# Patient Record
Sex: Female | Born: 1955 | Race: Black or African American | Hispanic: No | Smoking: Never smoker
Health system: Southern US, Community
[De-identification: ages and names within clinical notes are randomized; demographics above are authoritative.]

## PROBLEM LIST (undated history)

## (undated) ENCOUNTER — Emergency Department (HOSPITAL_COMMUNITY): Payer: Medicare Other

## (undated) DIAGNOSIS — K852 Alcohol induced acute pancreatitis without necrosis or infection: Secondary | ICD-10-CM

## (undated) DIAGNOSIS — Z992 Dependence on renal dialysis: Secondary | ICD-10-CM

## (undated) DIAGNOSIS — E1110 Type 2 diabetes mellitus with ketoacidosis without coma: Secondary | ICD-10-CM

## (undated) DIAGNOSIS — Z87442 Personal history of urinary calculi: Secondary | ICD-10-CM

## (undated) DIAGNOSIS — G629 Polyneuropathy, unspecified: Secondary | ICD-10-CM

## (undated) DIAGNOSIS — F32A Depression, unspecified: Secondary | ICD-10-CM

## (undated) DIAGNOSIS — E559 Vitamin D deficiency, unspecified: Secondary | ICD-10-CM

## (undated) DIAGNOSIS — K859 Acute pancreatitis without necrosis or infection, unspecified: Secondary | ICD-10-CM

## (undated) DIAGNOSIS — E785 Hyperlipidemia, unspecified: Secondary | ICD-10-CM

## (undated) DIAGNOSIS — I5030 Unspecified diastolic (congestive) heart failure: Secondary | ICD-10-CM

## (undated) DIAGNOSIS — R011 Cardiac murmur, unspecified: Secondary | ICD-10-CM

## (undated) DIAGNOSIS — N189 Chronic kidney disease, unspecified: Secondary | ICD-10-CM

## (undated) DIAGNOSIS — E538 Deficiency of other specified B group vitamins: Secondary | ICD-10-CM

## (undated) DIAGNOSIS — N12 Tubulo-interstitial nephritis, not specified as acute or chronic: Secondary | ICD-10-CM

## (undated) DIAGNOSIS — E876 Hypokalemia: Secondary | ICD-10-CM

## (undated) DIAGNOSIS — K529 Noninfective gastroenteritis and colitis, unspecified: Secondary | ICD-10-CM

## (undated) DIAGNOSIS — K3184 Gastroparesis: Secondary | ICD-10-CM

## (undated) DIAGNOSIS — M792 Neuralgia and neuritis, unspecified: Secondary | ICD-10-CM

## (undated) DIAGNOSIS — M62838 Other muscle spasm: Secondary | ICD-10-CM

## (undated) DIAGNOSIS — I10 Essential (primary) hypertension: Secondary | ICD-10-CM

## (undated) DIAGNOSIS — K219 Gastro-esophageal reflux disease without esophagitis: Secondary | ICD-10-CM

## (undated) DIAGNOSIS — F329 Major depressive disorder, single episode, unspecified: Secondary | ICD-10-CM

## (undated) DIAGNOSIS — R569 Unspecified convulsions: Secondary | ICD-10-CM

## (undated) HISTORY — DX: Noninfective gastroenteritis and colitis, unspecified: K52.9

## (undated) HISTORY — PX: TUBAL LIGATION: SHX77

## (undated) HISTORY — DX: Personal history of urinary calculi: Z87.442

---

## 2008-02-29 ENCOUNTER — Ambulatory Visit: Payer: Self-pay | Admitting: Cardiology

## 2008-03-01 ENCOUNTER — Encounter (INDEPENDENT_AMBULATORY_CARE_PROVIDER_SITE_OTHER): Payer: Self-pay | Admitting: Internal Medicine

## 2008-03-01 ENCOUNTER — Inpatient Hospital Stay (HOSPITAL_COMMUNITY): Admission: EM | Admit: 2008-03-01 | Discharge: 2008-03-02 | Payer: Self-pay | Admitting: Emergency Medicine

## 2008-06-18 ENCOUNTER — Inpatient Hospital Stay (HOSPITAL_COMMUNITY): Admission: EM | Admit: 2008-06-18 | Discharge: 2008-06-19 | Payer: Self-pay | Admitting: Emergency Medicine

## 2009-04-06 ENCOUNTER — Observation Stay (HOSPITAL_COMMUNITY)
Admission: EM | Admit: 2009-04-06 | Discharge: 2009-04-07 | Payer: Self-pay | Source: Home / Self Care | Admitting: Emergency Medicine

## 2010-02-04 ENCOUNTER — Ambulatory Visit: Payer: Self-pay | Admitting: Internal Medicine

## 2010-02-24 ENCOUNTER — Ambulatory Visit (HOSPITAL_COMMUNITY)
Admission: RE | Admit: 2010-02-24 | Discharge: 2010-02-24 | Payer: Self-pay | Source: Home / Self Care | Attending: Internal Medicine | Admitting: Internal Medicine

## 2010-02-24 ENCOUNTER — Ambulatory Visit: Admit: 2010-02-24 | Payer: Self-pay | Admitting: Internal Medicine

## 2010-02-24 HISTORY — PX: COLONOSCOPY: SHX174

## 2010-03-03 LAB — GLUCOSE, CAPILLARY: Glucose-Capillary: 290 mg/dL — ABNORMAL HIGH (ref 70–99)

## 2010-03-10 NOTE — Op Note (Signed)
  Erica [name], Erica [name]            ACCOUNT NO.:  [id number]  MEDICAL RECORD NO.:  [id number]          PATIENT TYPE:  AMB  LOCATION:  DAY                           FACILITY:  [facility]  PHYSICIAN:  Hildred Laser, M.D.    DATE OF BIRTH:  1955-10-28  DATE OF PROCEDURE:  02/24/2010 DATE OF DISCHARGE:                              OPERATIVE REPORT   PROCEDURE:  Colonoscopy.  INDICATIONS:  Erica [name] is 55 year old [place] female who has had diarrhea for more than 2 months.  Stool study has been negative. She also reports that she has lost several pounds.  She believes her appetite is normal.  She has also had some nocturnal bowel movements. Procedures were reviewed with the patient.  Informed consent was obtained.  MEDS FOR CONSCIOUS SEDATION: 1. Demerol 50 mg IV. 2. Versed 6 mg IV.  FINDINGS:  Procedure performed in endoscopy suite.  The patient's vital signs and O2 sat were monitored during the procedure and remained stable.  The patient was placed in left lateral recumbent position and rectal examination performed.  No abnormality noted on external or digital exam.  Pentax videoscope was placed through rectum and advanced under vision into sigmoid colon beyond.  Preparation was fair.  She had pooling of stool in various segments.  Scope was passed into cecum which was identified by appendiceal orifice and ileocecal valve.  There was a 2-mm polyp at cecum which was ablated via cold biopsy.  There is another 4-mm polyp at hepatic flexure ablated in similar fashion.  Mucosa.  Rest of the colon was normal.  Random biopsies were taken from mucosa of sigmoid colon looking for microscopic and/or collagenous colitis.  Rest of the mucosa was normal.  Rectal mucosa similarly was normal.  Scope was retroflexed to examine anorectal junction which was unremarkable.  Endoscope was then withdrawn.  Withdrawal time was 9 minutes.  The patient tolerated the procedure well.  FINAL  DIAGNOSES: 1. Examination performed to cecum. 2. Fair prep limiting quality of exam. 3. Two small polyps ablated via cold biopsy, one from cecum, second     one from hepatic flexure. 4. Biopsies taken from normal-appearing mucosa of sigmoid colon     looking for microscopic/collagenous colitis.  RECOMMENDATIONS: 1. Standard instructions given. 2. The patient advised to take loperamide OTC 2 mg b.i.d.  We will     contact patient with results of biopsy and further recommendations.     Hildred Laser, M.D.     NR/MEDQ  D:  02/24/2010  T:  02/24/2010  Job:  [id number]  cc:   Octavio Graves Fax: [phone]  Electronically Signed by Hildred Laser M.D. on 03/10/2010 12:14:55 PM

## 2010-03-31 ENCOUNTER — Ambulatory Visit (INDEPENDENT_AMBULATORY_CARE_PROVIDER_SITE_OTHER): Payer: Self-pay | Admitting: Internal Medicine

## 2010-05-07 LAB — GLUCOSE, CAPILLARY
Glucose-Capillary: 123 mg/dL — ABNORMAL HIGH (ref 70–99)
Glucose-Capillary: 154 mg/dL — ABNORMAL HIGH (ref 70–99)
Glucose-Capillary: 168 mg/dL — ABNORMAL HIGH (ref 70–99)
Glucose-Capillary: 32 mg/dL — CL (ref 70–99)
Glucose-Capillary: 32 mg/dL — CL (ref 70–99)
Glucose-Capillary: 34 mg/dL — CL (ref 70–99)
Glucose-Capillary: 43 mg/dL — CL (ref 70–99)
Glucose-Capillary: 48 mg/dL — ABNORMAL LOW (ref 70–99)
Glucose-Capillary: 62 mg/dL — ABNORMAL LOW (ref 70–99)
Glucose-Capillary: 65 mg/dL — ABNORMAL LOW (ref 70–99)
Glucose-Capillary: 74 mg/dL (ref 70–99)
Glucose-Capillary: 84 mg/dL (ref 70–99)
Glucose-Capillary: 87 mg/dL (ref 70–99)
Glucose-Capillary: 91 mg/dL (ref 70–99)
Glucose-Capillary: 97 mg/dL (ref 70–99)

## 2010-05-07 LAB — DIFFERENTIAL
Basophils Absolute: 0 10*3/uL (ref 0.0–0.1)
Basophils Relative: 1 % (ref 0–1)
Eosinophils Absolute: 0 10*3/uL (ref 0.0–0.7)
Eosinophils Relative: 0 % (ref 0–5)
Lymphocytes Relative: 24 % (ref 12–46)
Lymphs Abs: 1.5 10*3/uL (ref 0.7–4.0)
Monocytes Absolute: 0.4 10*3/uL (ref 0.1–1.0)
Monocytes Relative: 6 % (ref 3–12)
Neutro Abs: 4.3 10*3/uL (ref 1.7–7.7)
Neutrophils Relative %: 70 % (ref 43–77)

## 2010-05-07 LAB — COMPREHENSIVE METABOLIC PANEL
ALT: 54 U/L — ABNORMAL HIGH (ref 0–35)
AST: 49 U/L — ABNORMAL HIGH (ref 0–37)
Albumin: 3.6 g/dL (ref 3.5–5.2)
Alkaline Phosphatase: 141 U/L — ABNORMAL HIGH (ref 39–117)
BUN: 9 mg/dL (ref 6–23)
CO2: 30 mEq/L (ref 19–32)
Calcium: 8.4 mg/dL (ref 8.4–10.5)
Chloride: 105 mEq/L (ref 96–112)
Creatinine, Ser: 0.67 mg/dL (ref 0.4–1.2)
GFR calc Af Amer: >60 mL/min (ref >60)
GFR calc non Af Amer: >60 mL/min (ref >60)
Glucose, Bld: 94 mg/dL (ref 70–99)
Potassium: 4.5 mEq/L (ref 3.5–5.1)
Sodium: 142 mEq/L (ref 135–145)
Total Bilirubin: 0.5 mg/dL (ref 0.3–1.2)
Total Protein: 6.9 g/dL (ref 6.0–8.3)

## 2010-05-07 LAB — CBC
HCT: 39.3 % (ref 36.0–46.0)
Hemoglobin: 13.6 g/dL (ref 12.0–15.0)
MCHC: 34.6 g/dL (ref 30.0–36.0)
MCV: 93.1 fL (ref 78.0–100.0)
Platelets: 196 10*3/uL (ref 150–400)
RBC: 4.22 MIL/uL (ref 3.87–5.11)
RDW: 13 % (ref 11.5–15.5)
WBC: 6.1 10*3/uL (ref 4.0–10.5)

## 2010-05-07 LAB — HEMOGLOBIN A1C
Hgb A1c MFr Bld: 9.8 % — ABNORMAL HIGH (ref 4.6–6.1)
Mean Plasma Glucose: 235 mg/dL

## 2010-05-07 LAB — LIPASE, BLOOD: Lipase: 13 U/L (ref 11–59)

## 2010-05-07 LAB — ETHANOL: Alcohol, Ethyl (B): 234 mg/dL — ABNORMAL HIGH (ref 0–10)

## 2010-05-27 LAB — GLUCOSE, CAPILLARY
Glucose-Capillary: 232 mg/dL — ABNORMAL HIGH (ref 70–99)
Glucose-Capillary: 238 mg/dL — ABNORMAL HIGH (ref 70–99)
Glucose-Capillary: 249 mg/dL — ABNORMAL HIGH (ref 70–99)
Glucose-Capillary: 251 mg/dL — ABNORMAL HIGH (ref 70–99)
Glucose-Capillary: 261 mg/dL — ABNORMAL HIGH (ref 70–99)
Glucose-Capillary: 312 mg/dL — ABNORMAL HIGH (ref 70–99)
Glucose-Capillary: 315 mg/dL — ABNORMAL HIGH (ref 70–99)
Glucose-Capillary: 349 mg/dL — ABNORMAL HIGH (ref 70–99)
Glucose-Capillary: 380 mg/dL — ABNORMAL HIGH (ref 70–99)
Glucose-Capillary: 87 mg/dL (ref 70–99)

## 2010-05-27 LAB — CBC
HCT: 33.4 % — ABNORMAL LOW (ref 36.0–46.0)
HCT: 36.9 % (ref 36.0–46.0)
Hemoglobin: 11.5 g/dL — ABNORMAL LOW (ref 12.0–15.0)
Hemoglobin: 12.7 g/dL (ref 12.0–15.0)
MCHC: 34.4 g/dL (ref 30.0–36.0)
MCHC: 35 g/dL (ref 30.0–36.0)
MCV: 91.8 fL (ref 78.0–100.0)
Platelets: 190 10*3/uL (ref 150–400)
Platelets: 192 10*3/uL (ref 150–400)
RBC: 3.64 MIL/uL — ABNORMAL LOW (ref 3.87–5.11)
RDW: 13.5 % (ref 11.5–15.5)
RDW: 13.6 % (ref 11.5–15.5)
WBC: 8.4 10*3/uL (ref 4.0–10.5)

## 2010-05-27 LAB — URINALYSIS, ROUTINE W REFLEX MICROSCOPIC
Bilirubin Urine: NEGATIVE
Ketones, ur: NEGATIVE mg/dL
Protein, ur: NEGATIVE mg/dL
Urobilinogen, UA: 0.2 mg/dL (ref 0.0–1.0)

## 2010-05-27 LAB — BASIC METABOLIC PANEL
BUN: 11 mg/dL (ref 6–23)
BUN: 5 mg/dL — ABNORMAL LOW (ref 6–23)
BUN: 7 mg/dL (ref 6–23)
BUN: 8 mg/dL (ref 6–23)
CO2: 23 mEq/L (ref 19–32)
CO2: 23 mEq/L (ref 19–32)
CO2: 26 mEq/L (ref 19–32)
Calcium: 8.3 mg/dL — ABNORMAL LOW (ref 8.4–10.5)
Calcium: 8.9 mg/dL (ref 8.4–10.5)
Chloride: 103 mEq/L (ref 96–112)
Chloride: 113 mEq/L — ABNORMAL HIGH (ref 96–112)
Creatinine, Ser: 0.63 mg/dL (ref 0.4–1.2)
Creatinine, Ser: 0.68 mg/dL (ref 0.4–1.2)
GFR calc Af Amer: >60 mL/min (ref >60)
GFR calc non Af Amer: >60 mL/min (ref >60)
Glucose, Bld: 253 mg/dL — ABNORMAL HIGH (ref 70–99)
Glucose, Bld: 326 mg/dL — ABNORMAL HIGH (ref 70–99)
Glucose, Bld: 352 mg/dL — ABNORMAL HIGH (ref 70–99)
Potassium: 3 mEq/L — ABNORMAL LOW (ref 3.5–5.1)
Potassium: 4.5 mEq/L (ref 3.5–5.1)
Potassium: 4.7 mEq/L (ref 3.5–5.1)
Sodium: 133 mEq/L — ABNORMAL LOW (ref 135–145)
Sodium: 143 mEq/L (ref 135–145)

## 2010-05-27 LAB — DIFFERENTIAL
Basophils Absolute: 0 10*3/uL (ref 0.0–0.1)
Basophils Absolute: 0 10*3/uL (ref 0.0–0.1)
Basophils Relative: 0 % (ref 0–1)
Basophils Relative: 1 % (ref 0–1)
Eosinophils Absolute: 0.2 10*3/uL (ref 0.0–0.7)
Eosinophils Relative: 2 % (ref 0–5)
Eosinophils Relative: 2 % (ref 0–5)
Lymphocytes Relative: 28 % (ref 12–46)
Lymphs Abs: 2.3 10*3/uL (ref 0.7–4.0)
Monocytes Absolute: 0.4 10*3/uL (ref 0.1–1.0)
Monocytes Absolute: 0.6 10*3/uL (ref 0.1–1.0)
Monocytes Relative: 6 % (ref 3–12)
Neutro Abs: 3.7 10*3/uL (ref 1.7–7.7)
Neutrophils Relative %: 56 % (ref 43–77)

## 2010-05-27 LAB — HEPATIC FUNCTION PANEL
ALT: 20 U/L (ref 0–35)
Albumin: 3 g/dL — ABNORMAL LOW (ref 3.5–5.2)
Albumin: 3 g/dL — ABNORMAL LOW (ref 3.5–5.2)
Alkaline Phosphatase: 84 U/L (ref 39–117)
Bilirubin, Direct: 0.1 mg/dL (ref 0.0–0.3)
Indirect Bilirubin: 0.3 mg/dL (ref 0.3–0.9)
Total Bilirubin: 0.6 mg/dL (ref 0.3–1.2)
Total Protein: 5.7 g/dL — ABNORMAL LOW (ref 6.0–8.3)

## 2010-05-27 LAB — RETICULOCYTES
RBC.: 3.64 MIL/uL — ABNORMAL LOW (ref 3.87–5.11)
Retic Count, Absolute: 123.8 10*3/uL (ref 19.0–186.0)
Retic Ct Pct: 3.4 % — ABNORMAL HIGH (ref 0.4–3.1)

## 2010-05-27 LAB — IRON AND TIBC
Iron: 54 ug/dL (ref 42–135)
UIBC: 228 ug/dL

## 2010-05-27 LAB — FOLATE: Folate: 6.8 ng/mL

## 2010-05-27 LAB — FERRITIN: Ferritin: 155 ng/mL (ref 10–291)

## 2010-05-27 LAB — RAPID URINE DRUG SCREEN, HOSP PERFORMED
Amphetamines: NOT DETECTED
Benzodiazepines: POSITIVE — AB
Cocaine: NOT DETECTED
Opiates: NOT DETECTED
Tetrahydrocannabinol: NOT DETECTED

## 2010-05-27 LAB — POCT I-STAT, CHEM 8
BUN: 12 mg/dL (ref 6–23)
BUN: 12 mg/dL (ref 6–23)
Chloride: 114 mEq/L — ABNORMAL HIGH (ref 96–112)
Creatinine, Ser: 0.5 mg/dL (ref 0.4–1.2)
Creatinine, Ser: 0.7 mg/dL (ref 0.4–1.2)
Potassium: 3.4 mEq/L — ABNORMAL LOW (ref 3.5–5.1)
Sodium: 147 mEq/L — ABNORMAL HIGH (ref 135–145)
Sodium: 149 mEq/L — ABNORMAL HIGH (ref 135–145)
TCO2: 20 mmol/L (ref 0–100)

## 2010-05-27 LAB — VITAMIN B12: Vitamin B-12: 356 pg/mL (ref 211–911)

## 2010-06-02 LAB — URINALYSIS, ROUTINE W REFLEX MICROSCOPIC
Ketones, ur: 15 mg/dL — AB
Leukocytes, UA: NEGATIVE
Nitrite: NEGATIVE
Protein, ur: NEGATIVE mg/dL

## 2010-06-02 LAB — GLUCOSE, CAPILLARY
Glucose-Capillary: 278 mg/dL — ABNORMAL HIGH (ref 70–99)
Glucose-Capillary: 291 mg/dL — ABNORMAL HIGH (ref 70–99)

## 2010-06-02 LAB — COMPREHENSIVE METABOLIC PANEL
AST: 29 U/L (ref 0–37)
Albumin: 3.8 g/dL (ref 3.5–5.2)
Chloride: 96 mEq/L (ref 96–112)
Creatinine, Ser: 0.65 mg/dL (ref 0.4–1.2)
GFR calc Af Amer: >60 mL/min (ref >60)
Total Bilirubin: 0.5 mg/dL (ref 0.3–1.2)

## 2010-06-02 LAB — CBC
MCV: 90.7 fL (ref 78.0–100.0)
Platelets: 135 10*3/uL — ABNORMAL LOW (ref 150–400)
WBC: 5.3 10*3/uL (ref 4.0–10.5)

## 2010-06-02 LAB — CARDIAC PANEL(CRET KIN+CKTOT+MB+TROPI)
CK, MB: 0.8 ng/mL (ref 0.3–4.0)
CK, MB: 0.9 ng/mL (ref 0.3–4.0)
CK, MB: 1 ng/mL (ref 0.3–4.0)
Relative Index: INVALID (ref 0.0–2.5)
Relative Index: INVALID (ref 0.0–2.5)
Total CK: 28 U/L (ref 7–177)
Troponin I: 0.03 ng/mL (ref 0.00–0.06)
Troponin I: <0.01 ng/mL (ref 0.00–0.06)

## 2010-06-02 LAB — DIFFERENTIAL
Basophils Relative: 1 % (ref 0–1)
Eosinophils Absolute: 0.1 10*3/uL (ref 0.0–0.7)
Lymphs Abs: 1.9 10*3/uL (ref 0.7–4.0)
Neutro Abs: 3 10*3/uL (ref 1.7–7.7)
Neutrophils Relative %: 57 % (ref 43–77)

## 2010-06-02 LAB — LIPASE, BLOOD: Lipase: 11 U/L (ref 11–59)

## 2010-06-02 LAB — PROTIME-INR: Prothrombin Time: 14.1 seconds (ref 11.6–15.2)

## 2010-06-02 LAB — HEMOGLOBIN A1C

## 2010-07-01 NOTE — H&P (Signed)
Erica [name], Erica [name]            ACCOUNT NO.:  [id number]   MEDICAL RECORD NO.:  [id number]          PATIENT TYPE:  OBV   LOCATION:  [id number]                          FACILITY:  [facility]   PHYSICIAN:  Bonnielee Haff, MD     DATE OF BIRTH:  1955/10/22   DATE OF ADMISSION:  06/16/2008  DATE OF DISCHARGE:  LH                              HISTORY & PHYSICAL   PRIVATE MEDICAL DOCTOR:  Octavio Graves, in [place].   ADMITTING DIAGNOSES:  1. Persistent hypoglycemia.  2. Acute alcohol intoxication.  3. Hypotension, likely because of hypovolemia.  4. Mild hypokalemia.  5. History of insulin dependent diabetes mellitus.  6. History of dyslipidemia.  7. History of hypertension.   CHIEF COMPLAINT:  Nausea and vomiting.   HISTORY OF PRESENT ILLNESS:  The patient is a 55 year old African  American female old African  American female who was brought in to the hospital via EMS for altered  mental status.  According to the reports available from the ED, EMS was  called by one of her friends.  She apparently was confused and not  acting correct and they were concerned.  The patient appears to be a  little bit more lucid now.  She tells me that she has been having nausea  and vomiting for the past 3 days; however, denies any diarrhea or  abdominal pain.  She has been drinking vodka for the past many days.  She has 1-1/2 pints in the last 2-3 days.  She is also complaining of  numb feet and pain in her legs for the past many months.  She also  complains of difficulty with swallowing pills and food for the last year  or so.  She has never seen a gastroenterologist.  She denies any blood  in the stools or black colored stools.  She still appears to be slightly  confused so that is the extent of the history I have.  She keeps telling  me that she wants to go home.   MEDICATIONS AT HOME:  Unfortunately, she brought in only a few of her  pills.  The pills that we have include:  1. Fluconazole 100 mg daily.  2. Arthrotec b.i.d.  3.  Oxybutynin 5 mg b.i.d.  4. Metformin 500 mg b.i.d.  5. Gemfibrozil 600 mg b.i.d.  6. She has an empty bottle of ciprofloxacin.  7. She tells me that she is supposed to take Lantus 45 units every      night.  8. Apidra 10 units t.i.d. with meals.   She is on a lot of other medications which she cannot name at this time.   ALLERGIES:  ASPIRIN which apparently causes palpitations.   PAST MEDICAL HISTORY:  1. History of pancreatitis.  2. History of nephrolithiasis.  3. Diabetes mellitus.  4. Hypertension.  5. Dyslipidemia.  6. Acid reflux disease.  7. Depression.   SOCIAL HISTORY:  Lives in [place] with her boyfriend.  Currently  unemployed. No smoking; however drinks quite heavily.  Denies any  illicit drug use.  Independent with daily activities.   FAMILY HISTORY:  Positive for alcoholism and diabetes.   REVIEW  OF SYSTEMS:  GENERAL:  Positive for weakness.  HEENT:  Unremarkable.  CARDIOVASCULAR:  Unremarkable.  RESPIRATORY:  Unremarkable.  GASTROINTESTINAL:  As in HPI.  GU:  Unremarkable.  NEUROLOGIC: Unremarkable.  PSYCHIATRIC:  Unremarkable.  MUSCULOSKELETAL:  As in HPI.  DERMATOLOGIC:  Unremarkable.  Other systems unremarkable.   PHYSICAL EXAMINATION:  VITAL SIGNS:  Temperature 99.5 rectally, blood  pressure initially was 113/52, dropping into the 80s/40s, improved with  IV bolus.  The last reading was in the 110s/60s.  Heart rate in the 90s,  respirations 18, saturation 97% in room air.  GENERAL:  This is a middle aged [place] American female confused, in no  distress.  HEENT:  There is no pallor or icterus.  Oral mucosal membranes slightly  dry, no oral lesions are noted.  NECK: Soft and supple, no thyromegaly is appreciated.  LUNGS:  Clear to auscultation anteriorly bilaterally.  CARDIOVASCULAR:  S1 and S2 normal and regular, no murmurs appreciated.  No S3 or S4.  No rubs, no bruits.  ABDOMEN:  Soft, nontender, nondistended.  Bowel sounds present.  No mass  or  organomegaly is appreciated.  GU:  Deferred.  MUSCULOSKELETAL:  Unremarkable.  NEUROLOGIC:  She is somnolent, easily arousable.  No focal deficits are  present. Peripheral pulses are palpable.  SKIN:  No rash.   LABORATORY DATA:  Urinalysis shows glucosuria, trace blood.  Urine drug  screen positive for benzodiazepines.  Alcohol  level 359.  Hemoglobin  12.7, MCV 92, platelet count 319,000.  Initial sodium 3.0, glucose 326;  however subsequent readings, the glucose has been low in the 50s and 60s  and occasional in the 40s.  HbA1c could not be checked because of  abnormal hemoglobin.   No imaging studies have been done.   ASSESSMENT:  This is a 55 year old African American female who has  diabetes, hypertension, dyslipidemia, who unfortunately consumes a lot  of alcohol, who was brought in for altered mental status and found to  have refractory hypoglycemia.  She is also acutely intoxicated with  alcohol.  She has mild hypokalemia and hyponatremia.  She is also  transiently hypotensive which has responded to IV fluids. I think she is  a little bit hypovolemic because of the nausea and vomiting that she has  been mentioning.  Hypoglycemia probably is probably as a result of  alcohol intoxication.   PLAN:  1. Hypoglycemia.  She is on D5 drip.  Will check CBGs q.1 hour until      levels are greater than 150 x3 and then will change CBGs to q.4      hours.  Once the CBGs remain consistently high, D5 can be changed      over to normal saline or be discontinued depending on her blood      pressure.  Once her blood sugars stay stable off the dextrose once      she is eating, she should be able to go home.  2. Acute alcohol intoxication.  Will give her thiamine, check      magnesium levels.  PPI will be given.  Counseling will be provided.  3. History of dysphagia.  She does have a history of gastroesophageal      reflux disease and it is likely that she could have some strictures       which are causing dysphagia.  However, this is a chronic issue and      she requires outpatient GI evaluation.  PPIs will be continued.  4.  Hypotension, likely secondary to hypovolemia.  This has responded      to IV fluids.  Will monitor her closely overnight and give bolus as      needed.  5. Hypokalemia.  This will be repeated via IV fluids.  6. History of dyslipidemia.  Continue with gemfibrozil.  I do not know      if she is on any other agents for her cholesterol.  We will have to      obtain a medication list from home.  7. Deep vein thrombosis prophylaxis will be given.   Further management decisions will depend on the results of further  testing and patient's response to treatment.      Bonnielee Haff, MD  Electronically Signed     GK/MEDQ  D:  06/17/2008  T:  06/17/2008  Job:  [id number]   cc:   Octavio Graves  Fax: [phone]

## 2010-07-01 NOTE — H&P (Signed)
NAMEJAYDEEN, TOCCO            ACCOUNT NO.:  [id number]   MEDICAL RECORD NO.:  [id number]          PATIENT TYPE:  EMS   LOCATION:  ED                            FACILITY:  [facility]   PHYSICIAN:  Bonnielee Haff, MD     DATE OF BIRTH:  March 03, 1955   DATE OF ADMISSION:  02/29/2008  DATE OF DISCHARGE:  LH                              HISTORY & PHYSICAL   The patient's PMD is Dr. Octavio Graves who is [place].   ADMISSION DIAGNOSES:  1. Syncope, unclear etiology.  2. History of diabetes type 2.  3. Dyslipidemia.  4. Hypertension,  5. Acid reflux disease.  6. Depression.   CHIEF COMPLAINT:  Passed out.   HISTORY OF PRESENT ILLNESS:  The patient is a 55 year old African-  American female who was brought in to the hospital by EMS after she  apparently lost consciousness at home.  Unfortunately no family member  is available.  According to the ED reports the EMS was called by her  boyfriend who unfortunately did not come to the hospital.  Blood sugar  was checked at home and was greater than 200.  The patient does not  recall the event.  She is complaining of a right-sided headache, and she  thinks that she may have knocked her head on a door.  Otherwise, she  denies any chest pain, shortness of breath, nausea, vomiting.  Denies  any history of seizure episodes.  Denies any previous episodes of loss  of consciousness.  So history is not very forthcoming from this  individual.  She also denies any focal weakness on any one side.   MEDICATIONS AT HOME:  She is on Lovaza 4 grams once a day, Phenergan 25  mg as needed.  Chlordiazepoxide 25 mg 4 times a day, paroxetine 20 mg  once a day, fluconazole 100 mg once a day, Trilipix 135 mg once,  Bupropion XL 300 mg once a day, Arthrotec 50 twice a day, oxybutynin 5  mg twice a day, metformin 1000 mg twice a day.  Gemfibrozil 600 mg twice  a day, Klor-Con 20 mEq daily, Nexium 40 mg daily, lisinopril 20 mg  daily, Lantus insulin 45 units q.h.s.  sliding scale insulin.  Simvastatin 40 mg q.p.m. Prempro once a day, vitamin D twice a week.   ALLERGIES:  Aspirin, though she does have an aspirin bottle in her  medication box.   SURGICAL HISTORY:  She mentioned only surgeries for kidney stone.  Otherwise denies any other procedures.   SOCIAL HISTORY:  Lives in [place] alone.  No smoking.  Occasional  alcohol.  Independent with daily activities.   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   FAMILY HISTORY:  Positive for diabetes, alcoholism.   REVIEW OF SYSTEMS:  GENERAL:  Positive for weakness, otherwise  unremarkable.  CARDIOVASCULAR: Unremarkable.  RESPIRATORY:  Unremarkable.  HEENT: Positive for headache. GI: Unremarkable.  GU:  Unremarkable.  NEUROLOGICAL:  Unremarkable.  PSYCHIATRIC:  Unremarkable.  Other systems unremarkable.   PHYSICAL EXAMINATION:  VITAL SIGNS: Temperature 98.7, blood pressure  151/79, heart rate 96, respiratory rate 18, saturation 100% on room air.  GENERAL:  This is a middle-aged black female in no distress.  HEENT: There is no pallor, no icterus.  Oral mucous membranes are moist.  No oral lesions are noted.  NECK: Soft and supple.  No thyromegaly is appreciated.  LUNGS: Few crackles at the bases which decrease after her she takes deep  breaths.  No wheezing is present.  CARDIOVASCULAR:  S1, S2 normal,  regular. Possible systolic murmur in the aortic area.  No bruits  appreciated.  No S3-S4.  No rubs.  No edema is present.  ABDOMEN: Soft, nontender, nondistended.  Bowel sounds are present.  No  masses or organomegaly is appreciated.  NEUROLOGICAL:  She is alert, oriented to place, person and time.  According to the ED physician she was not oriented to time about an hour  ago.  No cranial nerve deficits are present.  Finger-to-nose is  deficient on the left side.  She seems to have subtle  left-sided  weakness, upper and lower extremities, though it is very difficult to  say.  Gait is not assessed.  Sensory exam is unremarkable.  Reflexes are  equal bilaterally.  Cerebellar signs slightly slower on the left side.  No pronator drift identified.   IMAGING STUDIES:  Chest x-ray was unremarkable.  CT of the head was done  which was unremarkable.   CBC showed platelet count of 135. Otherwise unremarkable.  CMET showed a  glucose of 309. Otherwise unremarkable. And UA did not show any  infection.  EKG showed sinus rhythm with a normal axis. Intervals appear  to be in the normal range.  No definite Q-waves are identified on EKG.  Poor R-wave progression is noted.  No acute ST or T-wave changes of  concern are appreciated.   ASSESSMENT:  This is a 55 year old African-American female who presents  with a syncopal episode, etiology of which is not very clear.  Differential diagnoses include seizure, transient ischemic attack,  cerebrovascular accident.   PLAN:  1. Syncope.  She merits observation in the hospital for further      treatment and evaluation.  She I think would benefit from an MRI      considering her possible weakness on the left side.  We will also      order an EEG, carotid Dopplers, echocardiogram.  Alcohol level will      be obtained.  Magnesium level will be checked.  PT/OT consultation      will also be obtained.  2. Diabetes. Continue with metformin. Hold off on the Lantus. She has      adequate p.o. intake.  Sliding scale will be provided.  3. Dyslipidemia.  She is on multiple agents for this, all of which      will be continued.  4. Deep vein thrombosis prophylaxis will be provided.   Further management decisions will depend on results of further testing  and patient's response to treatment.      Bonnielee Haff, MD  Electronically Signed     GK/MEDQ  D:  03/01/2008  T:  03/01/2008  Job:  [id number]   cc:   Octavio Graves  Fax: [phone]

## 2010-07-01 NOTE — Procedures (Signed)
NAMEJANI, Erica [name]            ACCOUNT NO.:  [id number]   MEDICAL RECORD NO.:  [id number]          PATIENT TYPE:  OBV   LOCATION:  A309                          FACILITY:  [facility]   PHYSICIAN:  Kofi A. Merlene Laughter, M.D. DATE OF BIRTH:  [birth year]   DATE OF PROCEDURE:  DATE OF DISCHARGE:                              EEG INTERPRETATION   HISTORY:  This is a 55 year old who presents with spells suspicious for  seizure.   MEDICATIONS:  Phenergan, hydrochlorothiazide, insulin, Paxil,  fluconazole, Wellbutrin, Arthrotec, potassium, Nexium, simvastatin,  Prempro, and vitamin D.   ANALYSIS:  A 16-channel recording is conducted for 26 minutes.  There is  a well-formed posterior rhythm of 9.5 Hz, which attenuates with eye  opening.  There is beta activity observed in the frontal areas.  Only  wake activity is observed, there is some drowsy activity.  Photic  stimulation and hyperventilation are carried out without significant  changes in the background activity.  There is no focal or lateralized  slowing.  There is no epileptiform activity observed.   IMPRESSION:  This is a normal recording of wake and drowsy states.  A  single seizure does not rule out epileptic seizure.  If clinically  indicated, a prolonged or sleep-deprived recording may be useful.      Kofi A. Merlene Laughter, M.D.  Electronically Signed     KAD/MEDQ  D:  03/02/2008  T:  03/02/2008  Job:  [id number]

## 2010-07-01 NOTE — Discharge Summary (Signed)
Erica [name], Erica [name]            ACCOUNT NO.:  [id number]   MEDICAL RECORD NO.:  [id number]          PATIENT TYPE:  INP   LOCATION:  [id number]                          FACILITY:  [facility]   PHYSICIAN:  Anselmo Pickler, DO    DATE OF BIRTH:  1955/07/10   DATE OF ADMISSION:  06/18/2008  DATE OF DISCHARGE:  05/04/2010LH                               DISCHARGE SUMMARY   PRIMARY CARE PHYSICIAN:  Dr. Octavio Graves.   There were no other consults besides diabetes management and there was  no testing/.   ADMISSION DIAGNOSES:  1. Persistent hypoglycemia.  2. Acute alcohol intoxication.  3. Hypotension, likely due to hypovolemia.  4. Mild hypokalemia.  5. History of insulin-dependent diabetes.  6. History of dyslipidemia.  7. History of hypertension.   HOSPITAL COURSE:  The patient is a 55 year old African American female  who was brought to the emergency room via EMS for altered mental status.  They stated that one of her friends called her, she called EMS.  She was  confused and she had nausea and vomiting for the last couple of days.  The patient has a history of alcoholism in which she drinks hard liquor  and goes on binges for many days.  She usually drinks vodka and did one  to two pints the last 2-3 days prior to her admission.  The patient  states that currently after discussion with her regarding whether or not  she wants to go to rehab or wants help with her alcoholism, she states  that she does not and states that she will continue to drink when she is  discharged.  The patient's blood sugars were followed.  She was put on a  D5 drip.  They were monitored q.4h.  Over time she was able to increase  her diet.  Her nausea or vomiting stopped.  Her hypovolemic state was  resolved.  She was also placed on the Ativan protocol due to her  pertinent positive alcohol level in the 300s.  When she was seen by  diabetes management they were cautious about putting her back on  metformin with  her drinking.  So we will take her off metformin and  leave her on her Lantus.  I have discussed with the patient the  necessity of her eating when she takes Lantus because if she does not  eat then her blood sugars will go down if she is on a binge drinking.  The patient has stated understanding of this.  However, I am concerned  about future medical noncompliance,   Her medications that she will go home on include:  1. Klor-Con 20 mEq daily.  2. Nexium 40 mg daily.  3. Lisinopril 20 mg daily.  4. Simvastatin 40 mg every evening.  5. Prempro 0.625/2.5 every day.  6. Ergocalciferol 500 units twice a day.  7. Lantus 45 units at bedtime and the sliding scale before meals 10      units before meals.  8. Lovaza 1 gram daily.  9. Phenergan 25 mg needed.  10.Chlordiazepoxide four times a day.  11.Paroxetine 20 mg daily.  12.Fluconazole  100 mg once a day.  13.Trilipix 135 mg every evening.  14.Bupropion 300 mg once a day.  15.Arthrotec 50/2 mg twice a day.  16.Oxybutynin 5 mg twice a day.  17.Gemfibrozil  600 mg twice a day.  18.Klor-Con 20 mEq once a day.   CONDITION:  Stable.   DISPOSITION:  To home.  There is some concern, however with the patient  unwilling to stop drinking that this situation may reoccur again.  So,  hopefully this will be able to be addressed by her primary care  physician.      Anselmo Pickler, DO  Electronically Signed     CB/MEDQ  D:  06/19/2008  T:  06/19/2008  Job:  [id number]

## 2010-07-04 NOTE — Discharge Summary (Signed)
Erica [name], Erica [name]            ACCOUNT NO.:  [id number]   MEDICAL RECORD NO.:  [id number]          PATIENT TYPE:  INP   LOCATION:  A309                          [facility]ACILITY:  [facility]   PHYSICIAN:  Audria Nine, M.D.DATE OF BIRTH:  01/08/1956   DATE OF ADMISSION:  02/29/2008  DATE OF DISCHARGE:  01/15/2010LH                               DISCHARGE SUMMARY   DISCHARGE DIAGNOSES:  1. Syncope.  2. Probable dehydration.  3. Diabetes mellitus, type 2.  4. Dyslipidemia.  5. Hypertension.  6. Gastroesophageal reflux disease.  7. Depression.   DISCHARGE MEDICATIONS:  1. Lovaza 4 g p.o. once a day.  2. Phenergan 25 mg as needed for nausea or vomiting.  3. Chlordiazepoxide 25 mg p.o. t.i.d.  4. Paroxetine 20 mg p.o. once a day.  5. Fluconazole 100 mg p.o. once a day.  6. Trilipix 135 mg p.o. once a day.  7. Bupropion XL 300 mg p.o. once a day.  8. Arthrotec 50 mg p.o. b.i.d.  9. Oxybutynin 5 mg once a day.  10.Potassium 20 mEq once a day.  11.Nexium 40 mg p.o. daily.  12.Lisinopril 20 mg once a day.  13.Lantus insulin 45 units subcu q.h.s.  14.Simvastatin 40 mg p.o. q.p.m.  15.Prempro once a day.  16.Vitamin D twice a week.   DIET:  Patient is going to be on a diabetic diet, heart-healthy diet.   ACTIVITIES:  As tolerated.   FOLLOWUP:  Patient to follow up with the primary care physician, Dr.  Octavio Graves, in [place] in about 3 to 4 weeks.   SPECIAL INSTRUCTIONS:  Patient advised to return to the emergency room  if she develops increasing shortness of breath or has any dizziness,  lightheadedness, or if she is notified that she had any syncopal episode  at home.   PERTINENT LABORATORY DATA DURING THE COURSE OF HOSPITALIZATION:  Chest x-  ray was negative.  A CT scan of the head was negative.  MRI of the brain  was also negative.  White blood cell count was normal.  Platelet count  was 135, blood glucose was 309.  Urinalysis was negative with no  evidence of  infection.  A 12-lead EKG shows normal sinus rhythm with a  normal axis.  No definite Q-waves were identified.   A carotid Doppler was negative for any evidence of hemodynamic  obstruction.   An EEG was normal.   CONSULTATIONS OBTAINED:  Dr. Merlene Laughter.   HOSPITAL COURSE:  Ms Giasson is a 55 year old female who was admitted  to the hospital on February 29, 2008.  She was brought in by EMS after  she apparently lost consciousness at home.  No family members were  available during the time of admission according to the ED reports.  EMS  was called by her boyfriend who did not follow her to the hospital at  the time of admission.  Patient's blood sugar was checked at that time  and was greater than 200.  Patient does not remember.  She was  complaining of a right-sided headache.  She thinks she might have  knocked her head on the floor.  She denied any chest pain, shortness of  breath, nausea, or vomiting, No impending doom feeling.  She had no  aura.  She has no history of seizure activity.  No history of loss of  consciousness.  She denied any focal weakness.   EXAMINATION AT THE TIME OF ADMISSION:  Patient was conscious, alert,  comfortable, not in acute distress, well oriented in time, place, and  person.  Her temperature was 98.7.  Blood pressure was 151/79 with a  heart rate of 96.  Respiratory rate is 18.  Oxygen saturation was 100%  on room air.  Rest of exam was unremarkable.  Patient was then admitted  to the medical floor and placed on telemetry.  She had unremarkable  evaluation including EEG and all as mentioned above.  Also, her  telemetry readings did not show any abnormalities.  Patient was  counseled in getting her blood sugar.  Her metformin was continued.  A  sliding scale was also provided in the course of hospitalization.  Since  patient's blood work was all negative, patient was advised to follow up  with her primary care physician.  Patient requested to be  discharged  home.  Patient was subsequently discharged home in satisfactory  condition.   DISPOSITION:  To home.      Audria Nine, M.D.  Electronically Signed     AM/MEDQ  D:  03/22/2008  T:  03/22/2008  Job:  [id number]

## 2010-10-29 ENCOUNTER — Encounter (INDEPENDENT_AMBULATORY_CARE_PROVIDER_SITE_OTHER): Payer: Self-pay

## 2011-06-11 DIAGNOSIS — R072 Precordial pain: Secondary | ICD-10-CM

## 2011-07-08 ENCOUNTER — Inpatient Hospital Stay (HOSPITAL_COMMUNITY)
Admission: EM | Admit: 2011-07-08 | Discharge: 2011-07-11 | DRG: 917 | Disposition: A | Discharge status: Home Health | Payer: Medicaid Other | Attending: Internal Medicine | Admitting: Internal Medicine

## 2011-07-08 ENCOUNTER — Emergency Department (HOSPITAL_COMMUNITY): Payer: Medicaid Other

## 2011-07-08 ENCOUNTER — Encounter (HOSPITAL_COMMUNITY): Payer: Self-pay | Admitting: Emergency Medicine

## 2011-07-08 DIAGNOSIS — R109 Unspecified abdominal pain: Secondary | ICD-10-CM | POA: Diagnosis present

## 2011-07-08 DIAGNOSIS — G929 Unspecified toxic encephalopathy: Secondary | ICD-10-CM | POA: Diagnosis present

## 2011-07-08 DIAGNOSIS — E876 Hypokalemia: Secondary | ICD-10-CM

## 2011-07-08 DIAGNOSIS — F10929 Alcohol use, unspecified with intoxication, unspecified: Secondary | ICD-10-CM | POA: Diagnosis present

## 2011-07-08 DIAGNOSIS — K861 Other chronic pancreatitis: Secondary | ICD-10-CM | POA: Diagnosis present

## 2011-07-08 DIAGNOSIS — Z794 Long term (current) use of insulin: Secondary | ICD-10-CM

## 2011-07-08 DIAGNOSIS — G9341 Metabolic encephalopathy: Secondary | ICD-10-CM

## 2011-07-08 DIAGNOSIS — R112 Nausea with vomiting, unspecified: Secondary | ICD-10-CM | POA: Diagnosis present

## 2011-07-08 DIAGNOSIS — L02519 Cutaneous abscess of unspecified hand: Secondary | ICD-10-CM | POA: Diagnosis present

## 2011-07-08 DIAGNOSIS — F1010 Alcohol abuse, uncomplicated: Secondary | ICD-10-CM

## 2011-07-08 DIAGNOSIS — G92 Toxic encephalopathy: Secondary | ICD-10-CM | POA: Diagnosis present

## 2011-07-08 DIAGNOSIS — L03119 Cellulitis of unspecified part of limb: Secondary | ICD-10-CM | POA: Diagnosis present

## 2011-07-08 DIAGNOSIS — E1169 Type 2 diabetes mellitus with other specified complication: Secondary | ICD-10-CM | POA: Diagnosis present

## 2011-07-08 DIAGNOSIS — E162 Hypoglycemia, unspecified: Secondary | ICD-10-CM

## 2011-07-08 DIAGNOSIS — E119 Type 2 diabetes mellitus without complications: Secondary | ICD-10-CM | POA: Diagnosis present

## 2011-07-08 DIAGNOSIS — T38801A Poisoning by unspecified hormones and synthetic substitutes, accidental (unintentional), initial encounter: Secondary | ICD-10-CM | POA: Diagnosis present

## 2011-07-08 DIAGNOSIS — Z8719 Personal history of other diseases of the digestive system: Secondary | ICD-10-CM

## 2011-07-08 DIAGNOSIS — T383X1A Poisoning by insulin and oral hypoglycemic [antidiabetic] drugs, accidental (unintentional), initial encounter: Principal | ICD-10-CM | POA: Diagnosis present

## 2011-07-08 DIAGNOSIS — R4182 Altered mental status, unspecified: Secondary | ICD-10-CM

## 2011-07-08 LAB — GLUCOSE, CAPILLARY
Glucose-Capillary: 178 mg/dL — ABNORMAL HIGH (ref 70–99)
Glucose-Capillary: 274 mg/dL — ABNORMAL HIGH (ref 70–99)
Glucose-Capillary: 92 mg/dL (ref 70–99)
Glucose-Capillary: 175 mg/dL — ABNORMAL HIGH (ref 70–99)
Glucose-Capillary: 181 mg/dL — ABNORMAL HIGH (ref 70–99)

## 2011-07-08 LAB — RAPID URINE DRUG SCREEN, HOSP PERFORMED
Amphetamines: NOT DETECTED
Cocaine: NOT DETECTED
Opiates: NOT DETECTED

## 2011-07-08 LAB — DIFFERENTIAL
Basophils Relative: 0 % (ref 0–1)
Lymphocytes Relative: 41 % (ref 12–46)
Lymphs Abs: 2.3 10*3/uL (ref 0.7–4.0)
Monocytes Absolute: 0.5 10*3/uL (ref 0.1–1.0)
Monocytes Relative: 8 % (ref 3–12)
Neutro Abs: 2.8 10*3/uL (ref 1.7–7.7)
Neutrophils Relative %: 50 % (ref 43–77)

## 2011-07-08 LAB — CBC
HCT: 32 % — ABNORMAL LOW (ref 36.0–46.0)
Hemoglobin: 11.4 g/dL — ABNORMAL LOW (ref 12.0–15.0)
MCHC: 35.6 g/dL (ref 30.0–36.0)
RBC: 3.67 MIL/uL — ABNORMAL LOW (ref 3.87–5.11)

## 2011-07-08 LAB — HEPATIC FUNCTION PANEL
Albumin: 3.2 g/dL — ABNORMAL LOW (ref 3.5–5.2)
Alkaline Phosphatase: 80 U/L (ref 39–117)
Total Bilirubin: 0.2 mg/dL — ABNORMAL LOW (ref 0.3–1.2)
Total Protein: 6.2 g/dL (ref 6.0–8.3)

## 2011-07-08 LAB — URINALYSIS, ROUTINE W REFLEX MICROSCOPIC
Bilirubin Urine: NEGATIVE
Leukocytes, UA: NEGATIVE
Nitrite: NEGATIVE
Specific Gravity, Urine: <1.005 — ABNORMAL LOW (ref 1.005–1.030)
Urobilinogen, UA: 0.2 mg/dL (ref 0.0–1.0)
pH: 5.5 (ref 5.0–8.0)

## 2011-07-08 LAB — BLOOD GAS, ARTERIAL
Bicarbonate: 27.2 mEq/L — ABNORMAL HIGH (ref 20.0–24.0)
Patient temperature: 37
TCO2: 25.1 mmol/L (ref 0–100)
pCO2 arterial: 44.6 mmHg (ref 35.0–45.0)
pH, Arterial: 7.403 — ABNORMAL HIGH (ref 7.350–7.400)
pO2, Arterial: 86.5 mmHg (ref 80.0–100.0)

## 2011-07-08 LAB — BASIC METABOLIC PANEL
BUN: 8 mg/dL (ref 6–23)
Chloride: 103 mEq/L (ref 96–112)
GFR calc Af Amer: >90 mL/min (ref >90)
GFR calc non Af Amer: >90 mL/min (ref >90)
Potassium: 2.7 mEq/L — CL (ref 3.5–5.1)
Sodium: 141 mEq/L (ref 135–145)

## 2011-07-08 LAB — POCT I-STAT, CHEM 8
Chloride: 103 mEq/L (ref 96–112)
HCT: 33 % — ABNORMAL LOW (ref 36.0–46.0)
Hemoglobin: 11.2 g/dL — ABNORMAL LOW (ref 12.0–15.0)
Potassium: 2.7 mEq/L — CL (ref 3.5–5.1)
Sodium: 144 mEq/L (ref 135–145)

## 2011-07-08 LAB — ACETAMINOPHEN LEVEL: Acetaminophen (Tylenol), Serum: <15 ug/mL (ref 10–30)

## 2011-07-08 LAB — CARDIAC PANEL(CRET KIN+CKTOT+MB+TROPI)
CK, MB: 1.3 ng/mL (ref 0.3–4.0)
Troponin I: <0.3 ng/mL (ref <0.30)

## 2011-07-08 LAB — SALICYLATE LEVEL: Salicylate Lvl: <2 mg/dL — ABNORMAL LOW (ref 2.8–20.0)

## 2011-07-08 MED ORDER — SODIUM CHLORIDE 0.9 % IV SOLN
1000.0000 mL | Freq: Once | INTRAVENOUS | Status: AC
  Administered 2011-07-08: 1000 mL via INTRAVENOUS

## 2011-07-08 MED ORDER — ADULT MULTIVITAMIN W/MINERALS CH
1.0000 | ORAL_TABLET | Freq: Once | ORAL | Status: AC
  Administered 2011-07-09: 1 via ORAL

## 2011-07-08 MED ORDER — DEXTROSE 50 % IV SOLN
INTRAVENOUS | Status: AC
  Administered 2011-07-08: 19:00:00
  Filled 2011-07-08: qty 50

## 2011-07-08 MED ORDER — THIAMINE HCL 100 MG/ML IJ SOLN
100.0000 mg | Freq: Once | INTRAMUSCULAR | Status: AC
  Administered 2011-07-09: 100 mg via INTRAVENOUS
  Filled 2011-07-08: qty 2

## 2011-07-08 MED ORDER — FOLIC ACID 1 MG PO TABS
1.0000 mg | ORAL_TABLET | Freq: Once | ORAL | Status: AC
  Administered 2011-07-09: 1 mg via ORAL
  Filled 2011-07-08: qty 1

## 2011-07-08 MED ORDER — DEXTROSE-NACL 5-0.45 % IV SOLN
INTRAVENOUS | Status: DC
  Administered 2011-07-08 (×2): via INTRAVENOUS

## 2011-07-08 MED ORDER — POTASSIUM CHLORIDE 10 MEQ/100ML IV SOLN
10.0000 meq | INTRAVENOUS | Status: AC
  Administered 2011-07-08 (×3): 10 meq via INTRAVENOUS
  Filled 2011-07-08 (×3): qty 100

## 2011-07-08 MED ORDER — DEXTROSE-NACL 5-0.45 % IV SOLN
INTRAVENOUS | Status: AC
  Administered 2011-07-09: 01:00:00 via INTRAVENOUS

## 2011-07-08 MED ORDER — SODIUM CHLORIDE 0.9 % IV SOLN: 1000.0000 mL | INTRAVENOUS | Status: DC

## 2011-07-08 MED ORDER — ONDANSETRON HCL 4 MG/2ML IJ SOLN: 4.0000 mg | Freq: Three times a day (TID) | INTRAMUSCULAR | Status: DC | PRN

## 2011-07-08 NOTE — ED Notes (Signed)
Initial ems cbg 401. Last one en route  36min pta was 213.given pt lethargic. States her stomach hurts and feels sleepy and weak. Oriented but sometimes needs light sternal rub.

## 2011-07-08 NOTE — ED Notes (Signed)
Pt is little more alert and is now able to state the correct year and month

## 2011-07-08 NOTE — H&P (Signed)
Chief Complaint:  Nausea and vomiting  HPI: 56 year old female with a history of alcohol abuse, insulin-dependent diabetes who comes in because of nausea vomiting and abdominal pain and altered mental status. Her mental status is clearer now she was intoxicated with alcohol earlier. She thinks she may have taken too much of her insulin today but she does not really remember because of the amount of alcohol she has had in the last the last 24 hours. She has not drank or eaten very much except alcohol in the last 24 hours. She says earlier this morning her glucose was 40 and she went ahead and took her Lantus anyway per nursing report. She denies any intentional overdose or suicide.  Review of Systems:  Otherwise negative  Past Medical History: Past Medical History  Diagnosis Date  . Chronic diarrhea   . History of kidney stones   . Diabetes mellitus    Past Surgical History  Procedure Date  . Colonoscopy 02/24/2010    Medications: Prior to Admission medications   Medication Sig Start Date End Date Taking? Authorizing Provider  estrogen, conjugated,-medroxyprogesterone (PREMPRO) 0.3-1.5 MG per tablet Take 1 tablet by mouth daily.      Historical Provider, MD  folic acid (FOLVITE) 1 MG tablet Take 1 mg by mouth daily.      Historical Provider, MD  insulin glargine (LANTUS) 100 UNIT/ML injection Inject into the skin at bedtime.      Historical Provider, MD  insulin glulisine (APIDRA) 100 UNIT/ML injection Inject into the skin 3 (three) times daily before meals.      Historical Provider, MD  lisinopril (PRINIVIL,ZESTRIL) 10 MG tablet Take by mouth daily.      Historical Provider, MD  metFORMIN (GLUMETZA) 1000 MG (MOD) 24 hr tablet Take 1,000 mg by mouth daily with breakfast.      Historical Provider, MD  PARoxetine (PAXIL) 10 MG tablet Take by mouth every morning.      Historical Provider, MD  Potassium 75 MG TABS Take by mouth.      Historical Provider, MD  valsartan (DIOVAN) 160 MG  tablet Take by mouth daily.      Historical Provider, MD    Allergies:   Allergies  Allergen Reactions  . Aspirin     Social History:  reports that she has never smoked. She does not have any smokeless tobacco history on file. She reports that she drinks alcohol. She reports that she does not use illicit drugs.  Family History: History reviewed. No pertinent family history.  Physical Exam: Filed Vitals:   07/08/11 1851 07/08/11 1857 07/08/11 1900 07/08/11 2030  BP: 100/55  107/66 118/70  Pulse:  88 88 87  Temp:      Resp: 20 17 16 15   SpO2:  97% 97% 100%   BP 104/65  Pulse 90  Temp(Src) 98.1 F (36.7 C) (Oral)  Resp 16  Ht 5' (1.524 m)  Wt 50 kg (110 lb 3.7 oz)  BMI 21.53 kg/m2  SpO2 98% General appearance: alert, cooperative and no distress Lungs: clear to auscultation bilaterally Heart: regular rate and rhythm, S1, S2 normal, no murmur, click, rub or gallop Abdomen: soft, non-tender; bowel sounds normal; no masses,  no organomegaly Extremities: extremities normal, atraumatic, no cyanosis or edema Pulses: 2+ and symmetric Skin: Skin color, texture, turgor normal. No rashes or lesions Neurologic: Grossly normal    Labs on Admission:   [facility] 07/08/11 1827 07/08/11 1816  NA 144 141  K 2.7* 2.7*  CL 103  103  CO2 -- 27  GLUCOSE 36* 37*  BUN 7 8  CREATININE 1.10 0.57  CALCIUM -- 9.5  MG -- --  PHOS -- --    Basename 07/08/11 1816  AST 41*  ALT 20  ALKPHOS 80  BILITOT 0.2*  PROT 6.2  ALBUMIN 3.2*    Basename 07/08/11 1816  LIPASE 7*  AMYLASE --    Basename 07/08/11 1827 07/08/11 1816  WBC -- 5.6  NEUTROABS -- 2.8  HGB 11.2* 11.4*  HCT 33.0* 32.0*  MCV -- 87.2  PLT -- 191    Radiological Exams on Admission: Ct Head Wo Contrast  07/08/2011  *RADIOLOGY REPORT*  Clinical Data: Acute mental status changes.  History of diabetes.  CT HEAD WITHOUT CONTRAST  Technique:  Contiguous axial images were obtained from the base of the skull through  the vertex without contrast.  Comparison: Unenhanced cranial CT 02/29/2008.  MRI brain 03/01/2008.  Findings: Slight head tilt in the gantry accounts for apparent asymmetry in the cerebral hemispheres.  Ventricular system normal in size and appearance for age.  Mild changes of small vessel disease of the white matter.  No mass lesion.  No midline shift. No acute hemorrhage or hematoma.  No extra-axial fluid collections. No evidence of acute infarction.  No significant interval change.  Hyperostosis frontalis interna.  No focal osseous abnormality involving the skull. Visualized paranasal sinuses, mastoid air cells, and middle ear cavities well-aerated.  IMPRESSION:  1.  No acute intracranial abnormality. 2.  Mild chronic microvascular ischemic changes of the white matter.  Original Report Authenticated By: Deniece Portela, M.D.   Dg Chest Portable 1 View  07/08/2011  *RADIOLOGY REPORT*  Clinical Data: Cough.  Altered mental status.  Unresponsive patient.  PORTABLE CHEST - 1 VIEW  Comparison: 06/03/2011  Findings: Low lung volumes are present, causing crowding of the pulmonary vasculature.  Mild atelectasis noted medially at the right lung base.  Cardiac and mediastinal contours appear unremarkable.  No pleural effusion noted.  IMPRESSION:  1.  Low lung volumes. 2.  Mild right medial basilar subsegmental atelectasis.  Original Report Authenticated By: Carron Curie, M.D.    Assessment/Plan Present on Admission:  56 year old female with alcohol intoxication metabolic encephalopathy secondary to hypoglycemia from unintentional insulin overdose  .Diabetes mellitus .Hypoglycemia .Metabolic encephalopathy .Alcohol intoxication .Alcohol abuse .Hypokalemia .Nausea & vomiting .Abdominal pain  Place on D5 drip and monitor sugars closely. Provide her with aggressive IV fluid hydration. Stop all diabetic medications at this time. She may have mild pancreatitis as she does have chronic pancreatitis  we'll put her on full liquid diet and advance as she tolerates this. Her encephalopathy again is likely due to combination of alcohol intoxication with hypoglycemia. There is no evidence or source of infection at this point. Further recommendations pending on overall hospital course.   Rosaura Bolon A [id number] 07/08/2011, 8:52 PM

## 2011-07-08 NOTE — ED Notes (Addendum)
Pt oriented to name only, not to place, not to month or year, unable to state how she got here, c/o abd pain

## 2011-07-08 NOTE — ED Notes (Signed)
Called [facility] house for IV fluids due to not on floor

## 2011-07-08 NOTE — ED Provider Notes (Signed)
History  This chart was scribed for Ezequiel Essex, MD by Jenne Campus. This patient was seen in room APA04/APA04 and the patient's care was started at 5:53PM.  CSN: [id number]  Arrival date & time 07/08/11  1741   First MD Initiated Contact with Patient 07/08/11 1753      No chief complaint on file.   The history is provided by the patient. No language interpreter was used.    Erica [name] is a 56 y.o. female brought in by ambulance, who presents to the Emergency Department complaining of 2 weeks of abdominal pain with associated emesis. Pt with a h/o DM and EMS checked CBG at 401 and 213 en route. Pt reports measuring her CBG at 448 today. She reports that she has a h/o pancreatitis and states that those prior episodes were induced by alcohol. She states that the pain is similar to the pain experienced with pancreatitis. She reports taking medications for her pancreatitis. She reports that she drinks 4 pints of Vodka every 2 weeks. She denies illegal drug use.  PCP is Julio Sicks.  Past Medical History  Diagnosis Date  . Chronic diarrhea   . History of kidney stones   . Diabetes mellitus     Past Surgical History  Procedure Date  . Colonoscopy 02/24/2010    History reviewed. No pertinent family history.  History  Substance Use Topics  . Smoking status: Never Smoker   . Smokeless tobacco: Not on file  . Alcohol Use: Yes     weekly    Review of Systems  A complete 10 system review of systems was obtained and all systems are negative except as noted in the HPI and PMH.   Allergies  Aspirin  Home Medications   Current Outpatient Rx  Name Route Sig Dispense Refill  . CONJ ESTROG-MEDROXYPROGEST ACE 0.3-1.5 MG PO TABS Oral Take 1 tablet by mouth daily.      Marland Kitchen FOLIC ACID 1 MG PO TABS Oral Take 1 mg by mouth daily.      . INSULIN GLARGINE 100 UNIT/ML Posey SOLN Subcutaneous Inject into the skin at bedtime.      . INSULIN GLULISINE 100 UNIT/ML [place] SOLN Subcutaneous  Inject into the skin 3 (three) times daily before meals.      Marland Kitchen LISINOPRIL 10 MG PO TABS Oral Take by mouth daily.      Marland Kitchen METFORMIN HCL ER (MOD) 1000 MG PO TB24 Oral Take 1,000 mg by mouth daily with breakfast.      . PAROXETINE HCL 10 MG PO TABS Oral Take by mouth every morning.      Marland Kitchen POTASSIUM 75 MG PO TABS Oral Take by mouth.      . VALSARTAN 160 MG PO TABS Oral Take by mouth daily.        Triage Vitals: BP 105/64  Pulse 95  Temp 98.6 F (37 C)  Resp 18  SpO2 96%  Physical Exam  Nursing note and vitals reviewed. Constitutional: She is oriented to person, place, and time. She appears well-developed and well-nourished. No distress.  HENT:  Head: Normocephalic and atraumatic.       Brown material in mouth which pt states is snuff  Eyes: Conjunctivae and EOM are normal.  Neck: Normal range of motion. Neck supple. No tracheal deviation present.  Cardiovascular: Normal rate, regular rhythm and normal heart sounds.   Pulmonary/Chest: Effort normal and breath sounds normal. No respiratory distress.       somnolent restricted airway  Abdominal: Soft. There is tenderness (mild epigastric tenderness). There is no rebound and no guarding.  Musculoskeletal: Normal range of motion. She exhibits no edema.  Neurological: She is alert and oriented to person, place, and time.  Skin: Skin is warm and dry.  Psychiatric: She has a normal mood and affect. Her behavior is normal.    ED Course  Procedures (including critical care time)  DIAGNOSTIC STUDIES: Oxygen Saturation is 96% on room air, adequate by my interpretation.    COORDINATION OF CARE: 6:00PM-Discussed treatment plan with pt and pt agreed to plan. 6:53PM-Pt rechecked and is sleeping. She is not arousalable to verbal or physical stimuli. Per nurse, pt is oriented to self only currently. CBG rechecked at 178. 7:28PM-Pt rechecked and is arousable to verbal stimuli. She still c/o abdominal pain but is not lucid enough to elaborate  further. 9:26PM-Pt rechecked and is arousable to verbal stimuli and responds to questions. She still c/o abdominal pain but is not lucid enough to elaborate further.   Labs Reviewed  BASIC METABOLIC PANEL - Abnormal; Notable for the following:    Potassium 2.7 (*)    Glucose, Bld 37 (*)    All other components within normal limits  CBC - Abnormal; Notable for the following:    RBC 3.67 (*)    Hemoglobin 11.4 (*)    HCT 32.0 (*)    All other components within normal limits  BLOOD GAS, ARTERIAL - Abnormal; Notable for the following:    pH, Arterial 7.403 (*)    Bicarbonate 27.2 (*)    Acid-Base Excess 2.8 (*)    All other components within normal limits  URINALYSIS, ROUTINE W REFLEX MICROSCOPIC - Abnormal; Notable for the following:    Specific Gravity, Urine <1.005 (*)    Glucose, UA 250 (*)    All other components within normal limits  LIPASE, BLOOD - Abnormal; Notable for the following:    Lipase 7 (*)    All other components within normal limits  HEPATIC FUNCTION PANEL - Abnormal; Notable for the following:    Albumin 3.2 (*)    AST 41 (*)    Total Bilirubin 0.2 (*)    All other components within normal limits  URINE RAPID DRUG SCREEN (HOSP PERFORMED) - Abnormal; Notable for the following:    Benzodiazepines POSITIVE (*)    All other components within normal limits  ETHANOL - Abnormal; Notable for the following:    Alcohol, Ethyl (B) 319 (*)    All other components within normal limits  SALICYLATE LEVEL - Abnormal; Notable for the following:    Salicylate Lvl 123456 (*)    All other components within normal limits  POCT I-STAT, CHEM 8 - Abnormal; Notable for the following:    Potassium 2.7 (*)    Glucose, Bld 36 (*)    Hemoglobin 11.2 (*)    HCT 33.0 (*)    All other components within normal limits  GLUCOSE, CAPILLARY - Abnormal; Notable for the following:    Glucose-Capillary 274 (*)    All other components within normal limits  GLUCOSE, CAPILLARY - Abnormal; Notable  for the following:    Glucose-Capillary 178 (*)    All other components within normal limits  GLUCOSE, CAPILLARY - Abnormal; Notable for the following:    Glucose-Capillary 175 (*)    All other components within normal limits  KETONES, QUALITATIVE  DIFFERENTIAL  ACETAMINOPHEN LEVEL  AMMONIA  GLUCOSE, CAPILLARY  CARDIAC PANEL(CRET KIN+CKTOT+MB+TROPI)  LACTIC ACID, PLASMA   Lab  Results Entered Manually 07/08/11:  Test  Result  Unit CK     47  U/L  CKMB    1.34  ng/mL Troponin  0.300  Ng/ml L     9 H     4 I                 0 Lactic Acid 3.0  Mmol/L L   7 H                      767 I   0  No results found.   No diagnosis found.    MDM  Decreased level of consciousness for the past day. Patient unable to provide much history. History of diabetes, alcohol abuse, pancreatitis. She is somnolent but arousable and follows commands. She complains of abdominal pain.  CT head negative. Ethanol level 319. Hypokalemia and hypoglycemia noted. Patient had recurrent hypoglycemia in the ED despite treatment with bolus sugar. She started on D5 half-normal drip.  We'll admit for further treatment of hypokalemia, hypo-glycemia, altered mental status.  CRITICAL CARE Performed by: Ezequiel Essex   Total critical care time: 35  Critical care time was exclusive of separately billable procedures and treating other patients.  Critical care was necessary to treat or prevent imminent or life-threatening deterioration.  Critical care was time spent personally by me on the following activities: development of treatment plan with patient and/or surrogate as well as nursing, discussions with consultants, evaluation of patient's response to treatment, examination of patient, obtaining history from patient or surrogate, ordering and performing treatments and interventions, ordering and review of laboratory studies, ordering and review of radiographic studies, pulse oximetry and re-evaluation of  patient's condition.  I personally performed the services described in this documentation, which was scribed in my presence.  The recorded information has been reviewed and considered.    Date: 07/08/2011  Rate: 96  Rhythm: normal sinus rhythm  QRS Axis: normal  Intervals: normal  ST/T Wave abnormalities: normal  Conduction Disutrbances:none  Narrative Interpretation:   Old EKG Reviewed: unchanged    Ezequiel Essex, MD 07/08/11 [phone]

## 2011-07-08 NOTE — ED Notes (Signed)
Assisted pt to bedpan and linens got soiled, linens changed and pt washed clean

## 2011-07-08 NOTE — ED Notes (Signed)
CRITICAL VALUE ALERT  Critical value received:  Potassium 2.7  Glucose 37  Date of notification:  07/08/11  Time of notification:  1920  Critical value read back:yes  Nurse who received alert:  Derek Mound, RN  MD notified (1st page):  Rancour  Time of first page:  1922  MD notified (2nd page):  Time of second page:  Responding MD:  Rancour  Time MD responded: [phone]

## 2011-07-09 DIAGNOSIS — G9341 Metabolic encephalopathy: Secondary | ICD-10-CM

## 2011-07-09 DIAGNOSIS — E1169 Type 2 diabetes mellitus with other specified complication: Secondary | ICD-10-CM

## 2011-07-09 DIAGNOSIS — F1010 Alcohol abuse, uncomplicated: Secondary | ICD-10-CM

## 2011-07-09 DIAGNOSIS — R112 Nausea with vomiting, unspecified: Secondary | ICD-10-CM

## 2011-07-09 LAB — GLUCOSE, CAPILLARY
Glucose-Capillary: 167 mg/dL — ABNORMAL HIGH (ref 70–99)
Glucose-Capillary: 26 mg/dL — CL (ref 70–99)
Glucose-Capillary: 31 mg/dL — CL (ref 70–99)
Glucose-Capillary: 112 mg/dL — ABNORMAL HIGH (ref 70–99)
Glucose-Capillary: 111 mg/dL — ABNORMAL HIGH (ref 70–99)
Glucose-Capillary: 58 mg/dL — ABNORMAL LOW (ref 70–99)
Glucose-Capillary: 172 mg/dL — ABNORMAL HIGH (ref 70–99)

## 2011-07-09 LAB — BASIC METABOLIC PANEL
CO2: 27 mEq/L (ref 19–32)
Calcium: 8.6 mg/dL (ref 8.4–10.5)
Creatinine, Ser: 0.5 mg/dL (ref 0.50–1.10)
GFR calc Af Amer: >90 mL/min (ref >90)
GFR calc non Af Amer: >90 mL/min (ref >90)
Sodium: 144 mEq/L (ref 135–145)

## 2011-07-09 LAB — CBC
MCV: 87.8 fL (ref 78.0–100.0)
Platelets: 173 10*3/uL (ref 150–400)
RBC: 3.61 MIL/uL — ABNORMAL LOW (ref 3.87–5.11)
RDW: 12.5 % (ref 11.5–15.5)
WBC: 4.6 10*3/uL (ref 4.0–10.5)

## 2011-07-09 MED ORDER — KCL IN DEXTROSE-NACL 20-5-0.9 MEQ/L-%-% IV SOLN
INTRAVENOUS | Status: DC
  Administered 2011-07-09: 03:00:00 via INTRAVENOUS

## 2011-07-09 MED ORDER — GLUCOSE 40 % PO GEL
1.0000 | ORAL | Status: DC | PRN
  Administered 2011-07-09: 37.5 g via ORAL

## 2011-07-09 MED ORDER — DEXTROSE 50 % IV SOLN
INTRAVENOUS | Status: AC
  Filled 2011-07-09: qty 50

## 2011-07-09 MED ORDER — ONDANSETRON HCL 4 MG PO TABS: 4.0000 mg | ORAL_TABLET | Freq: Four times a day (QID) | ORAL | Status: DC | PRN

## 2011-07-09 MED ORDER — ONDANSETRON HCL 4 MG/2ML IJ SOLN: 4.0000 mg | Freq: Four times a day (QID) | INTRAMUSCULAR | Status: DC | PRN

## 2011-07-09 MED ORDER — BIOTENE DRY MOUTH MT LIQD: 15.0000 mL | Freq: Two times a day (BID) | OROMUCOSAL | Status: DC

## 2011-07-09 MED ORDER — GLUCOSE 40 % PO GEL
ORAL | Status: AC
  Administered 2011-07-09: 37.5 g via ORAL
  Filled 2011-07-09: qty 0.83

## 2011-07-09 MED ORDER — FOLIC ACID 1 MG PO TABS
1.0000 mg | ORAL_TABLET | Freq: Every day | ORAL | Status: DC
  Administered 2011-07-09 – 2011-07-10 (×2): 1 mg via ORAL
  Filled 2011-07-09 (×2): qty 1

## 2011-07-09 MED ORDER — DEXTROSE 50 % IV SOLN: 50.0000 mL | Freq: Once | INTRAVENOUS | Status: DC | PRN

## 2011-07-09 MED ORDER — CHLORHEXIDINE GLUCONATE 0.12 % MT SOLN: 15.0000 mL | Freq: Two times a day (BID) | OROMUCOSAL | Status: DC

## 2011-07-09 MED ORDER — SODIUM CHLORIDE 0.9 % IJ SOLN
3.0000 mL | Freq: Two times a day (BID) | INTRAMUSCULAR | Status: DC
  Administered 2011-07-09 – 2011-07-10 (×2): 3 mL via INTRAVENOUS
  Filled 2011-07-09 (×2): qty 3

## 2011-07-09 MED ORDER — DEXTROSE 50 % IV SOLN
INTRAVENOUS | Status: AC
  Administered 2011-07-09: 01:00:00
  Filled 2011-07-09: qty 50

## 2011-07-09 MED ORDER — POTASSIUM CHLORIDE CRYS ER 20 MEQ PO TBCR
40.0000 meq | EXTENDED_RELEASE_TABLET | Freq: Once | ORAL | Status: AC
  Administered 2011-07-09: 40 meq via ORAL
  Filled 2011-07-09: qty 2

## 2011-07-09 NOTE — Progress Notes (Signed)
UR Chart Review Completed  

## 2011-07-09 NOTE — Progress Notes (Signed)
Subjective: Patient is awake and alert and able to provide history.  Reports po intake has been poor, she has been taking her medications as prescribed.  She has been consuming alcohol heavily and has had poor po intake.  Objective: Vital signs in last 24 hours: Temp:  [98.1 F (36.7 C)-98.6 F (37 C)] 98.2 F (36.8 C) (05/23 0600) Pulse Rate:  [78-95] 78  (05/23 0600) Resp:  [15-20] 18  (05/23 0600) BP: (100-136)/(55-77) 136/77 mmHg (05/23 0600) SpO2:  [96 %-100 %] 98 % (05/23 0600) Weight:  [50 kg (110 lb 3.7 oz)] 50 kg (110 lb 3.7 oz) (05/23 0028) Weight change:     Intake/Output from previous day: 05/22 0701 - 05/23 0700 In: 1200 [I.V.:1200] Out: -      Physical Exam: General: Alert, awake, oriented x3, in no acute distress. HEENT: No bruits, no goiter. Heart: Regular rate and rhythm, without murmurs, rubs, gallops. Lungs: Clear to auscultation bilaterally. Abdomen: Soft, nontender, nondistended, positive bowel sounds. Extremities: No clubbing cyanosis or edema with positive pedal pulses. Neuro: Grossly intact, nonfocal.    Lab Results: Basic Metabolic Panel:  Basename 07/09/11 0526 07/08/11 1827 07/08/11 1816  NA 144 144 --  K 3.3* 2.7* --  CL 109 103 --  CO2 27 -- 27  GLUCOSE 120* 36* --  BUN 5* 7 --  CREATININE 0.50 1.10 --  CALCIUM 8.6 -- 9.5  MG -- -- --  PHOS -- -- --   Liver Function Tests:  Basename 07/08/11 1816  AST 41*  ALT 20  ALKPHOS 80  BILITOT 0.2*  PROT 6.2  ALBUMIN 3.2*    Basename 07/08/11 1816  LIPASE 7*  AMYLASE --    Basename 07/08/11 1816  AMMONIA 53   CBC:  Basename 07/09/11 0526 07/08/11 1827 07/08/11 1816  WBC 4.6 -- 5.6  NEUTROABS -- -- 2.8  HGB 11.5* 11.2* --  HCT 31.7* 33.0* --  MCV 87.8 -- 87.2  PLT 173 -- 191   Cardiac Enzymes:  Basename 07/08/11 2039  CKTOTAL 47  CKMB 1.3  CKMBINDEX --  TROPONINI <0.30   BNP: No results found for this basename: PROBNP:3 in the last 72 hours D-Dimer: No results  found for this basename: DDIMER:2 in the last 72 hours CBG:  Basename 07/09/11 0752 07/09/11 0601 07/09/11 0458 07/09/11 0342 07/09/11 0243 07/09/11 0116  GLUCAP 128* 96 112* 138* 146* 167*   Hemoglobin A1C: No results found for this basename: HGBA1C in the last 72 hours Fasting Lipid Panel: No results found for this basename: CHOL,HDL,LDLCALC,TRIG,CHOLHDL,LDLDIRECT in the last 72 hours Thyroid Function Tests: No results found for this basename: TSH,T4TOTAL,FREET4,T3FREE,THYROIDAB in the last 72 hours Anemia Panel: No results found for this basename: VITAMINB12,FOLATE,FERRITIN,TIBC,IRON,RETICCTPCT in the last 72 hours Coagulation: No results found for this basename: LABPROT:2,INR:2 in the last 72 hours Urine Drug Screen: Drugs of Abuse     Component Value Date/Time   LABOPIA NONE DETECTED 07/08/2011 1946   COCAINSCRNUR NONE DETECTED 07/08/2011 1946   LABBENZ POSITIVE* 07/08/2011 1946   AMPHETMU NONE DETECTED 07/08/2011 1946   THCU NONE DETECTED 07/08/2011 1946   LABBARB NONE DETECTED 07/08/2011 1946    Alcohol Level:  Basename 07/08/11 1816  ETH 319*   Urinalysis:  Basename 07/08/11 1946  COLORURINE YELLOW  LABSPEC <1.005*  PHURINE 5.5  GLUCOSEU 250*  HGBUR NEGATIVE  BILIRUBINUR NEGATIVE  KETONESUR NEGATIVE  PROTEINUR NEGATIVE  UROBILINOGEN 0.2  NITRITE NEGATIVE  LEUKOCYTESUR NEGATIVE    No results found for this or any  previous visit (from the past 240 hour(s)).  Studies/Results: Ct Head Wo Contrast  07/08/2011  *RADIOLOGY REPORT*  Clinical Data: Acute mental status changes.  History of diabetes.  CT HEAD WITHOUT CONTRAST  Technique:  Contiguous axial images were obtained from the base of the skull through the vertex without contrast.  Comparison: Unenhanced cranial CT 02/29/2008.  MRI brain 03/01/2008.  Findings: Slight head tilt in the gantry accounts for apparent asymmetry in the cerebral hemispheres.  Ventricular system normal in size and appearance for age.  Mild  changes of small vessel disease of the white matter.  No mass lesion.  No midline shift. No acute hemorrhage or hematoma.  No extra-axial fluid collections. No evidence of acute infarction.  No significant interval change.  Hyperostosis frontalis interna.  No focal osseous abnormality involving the skull. Visualized paranasal sinuses, mastoid air cells, and middle ear cavities well-aerated.  IMPRESSION:  1.  No acute intracranial abnormality. 2.  Mild chronic microvascular ischemic changes of the white matter.  Original Report Authenticated By: Deniece Portela, M.D.   Dg Chest Portable 1 View  07/08/2011  *RADIOLOGY REPORT*  Clinical Data: Cough.  Altered mental status.  Unresponsive patient.  PORTABLE CHEST - 1 VIEW  Comparison: 06/03/2011  Findings: Low lung volumes are present, causing crowding of the pulmonary vasculature.  Mild atelectasis noted medially at the right lung base.  Cardiac and mediastinal contours appear unremarkable.  No pleural effusion noted.  IMPRESSION:  1.  Low lung volumes. 2.  Mild right medial basilar subsegmental atelectasis.  Original Report Authenticated By: Carron Curie, M.D.    Medications: Scheduled Meds:   . sodium chloride  1,000 mL Intravenous Once  . dextrose 5 % and 0.45% NaCl   Intravenous STAT  . dextrose      . dextrose      . folic acid  1 mg Oral Once  . folic acid  1 mg Oral Daily  . mulitivitamin with minerals  1 tablet Oral Once  . potassium chloride  10 mEq Intravenous Q1 Hr x 3  . sodium chloride  3 mL Intravenous Q12H  . thiamine  100 mg Intravenous Once  . DISCONTD: antiseptic oral rinse  15 mL Mouth Rinse q12n4p  . DISCONTD: chlorhexidine  15 mL Mouth Rinse BID   Continuous Infusions:   . dextrose 5 % and 0.9 % NaCl with KCl 20 mEq/L 100 mL/hr at 07/09/11 0243  . DISCONTD: sodium chloride Stopped (07/08/11 2037)  . DISCONTD: dextrose 5 % and 0.45% NaCl 100 mL/hr at 07/08/11 2126   PRN Meds:.ondansetron (ZOFRAN) IV,  ondansetron, DISCONTD: dextrose, DISCONTD: dextrose, DISCONTD: dextrose, DISCONTD: ondansetron (ZOFRAN) IV  Assessment/Plan:  Principal Problem:  *Metabolic encephalopathy Active Problems:  Diabetes mellitus  Hypoglycemia  Alcohol intoxication  Alcohol abuse  Hypokalemia  Nausea & vomiting  Abdominal pain  H/O chronic pancreatitis  Plan:  Blood sugars are improving on dextrose infusion.  Her hypoglycemia is likely due to poor po intake while taking her insulin.  We will continue to monitor blood sugars.  Discontinue dextrose and monitor for any hypoglycemia.   Her insulin will likely need to be restarted at lower doses. Will check HgbA1C  She does have a history of alcohol abuse.  Will ask csw to see for resources, monitor for withdrawal.  Her potassium will be repleted.  LOS: 1 day   Elpidia Karn [facility] Pager: [phone] 07/09/2011, 11:22 AM

## 2011-07-09 NOTE — Care Management Note (Unsigned)
    Page 1 of 1   07/09/2011     3:48:26 PM   CARE MANAGEMENT NOTE 07/09/2011  Patient:  JYLIAN, WEISHUHN   Account Number:  [id number]  Date Initiated:  07/09/2011  Documentation initiated by:  Claretha Cooper  Subjective/Objective Assessment:   Pt admitted with hypoglycemia and ETOH abuse. PTA, lives at home with significant other.     Action/Plan:   Spoke to pt at bedside. Declined HH RN teaching for DM. Pt also denied having [facility] overseeing her for [facility].   Anticipated DC Date:  07/10/2011   Anticipated DC Plan:  [place]  CM consult      Choice offered to / List presented to:             Status of service:  In process, will continue to follow Medicare Important Message given?   (If response is "NO", the following Medicare IM given date fields will be blank) Date Medicare IM given:   Date Additional Medicare IM given:    Discharge Disposition:    Per UR Regulation:    If discussed at Long Length of Stay Meetings, dates discussed:    Comments:  07/09/11 [place] for P4[facility] has seen pt at home and is now scheduled to follow up with her at her home next Tuesday. Pt did state that Octavio Graves MD is her PCP.

## 2011-07-10 DIAGNOSIS — G9341 Metabolic encephalopathy: Secondary | ICD-10-CM

## 2011-07-10 DIAGNOSIS — E1169 Type 2 diabetes mellitus with other specified complication: Secondary | ICD-10-CM

## 2011-07-10 DIAGNOSIS — R112 Nausea with vomiting, unspecified: Secondary | ICD-10-CM

## 2011-07-10 DIAGNOSIS — F1010 Alcohol abuse, uncomplicated: Secondary | ICD-10-CM

## 2011-07-10 LAB — BASIC METABOLIC PANEL
GFR calc Af Amer: >90 mL/min (ref >90)
GFR calc non Af Amer: >90 mL/min (ref >90)
Potassium: 3.7 mEq/L (ref 3.5–5.1)
Sodium: 138 mEq/L (ref 135–145)

## 2011-07-10 LAB — GLUCOSE, CAPILLARY
Glucose-Capillary: 105 mg/dL — ABNORMAL HIGH (ref 70–99)
Glucose-Capillary: 82 mg/dL (ref 70–99)

## 2011-07-10 MED ORDER — INSULIN ASPART 100 UNIT/ML ~~LOC~~ SOLN
0.0000 [IU] | Freq: Three times a day (TID) | SUBCUTANEOUS | Status: DC
  Administered 2011-07-10: 2 [IU] via SUBCUTANEOUS
  Administered 2011-07-10: 3 [IU] via SUBCUTANEOUS
  Administered 2011-07-11: 2 [IU] via SUBCUTANEOUS

## 2011-07-10 MED ORDER — LORAZEPAM 1 MG PO TABS: 1.0000 mg | ORAL_TABLET | Freq: Four times a day (QID) | ORAL | Status: DC | PRN

## 2011-07-10 MED ORDER — INSULIN GLARGINE 100 UNIT/ML ~~LOC~~ SOLN
25.0000 [IU] | Freq: Every day | SUBCUTANEOUS | Status: DC
  Administered 2011-07-10: 25 [IU] via SUBCUTANEOUS

## 2011-07-10 MED ORDER — LORAZEPAM 2 MG/ML IJ SOLN: 1.0000 mg | Freq: Four times a day (QID) | INTRAMUSCULAR | Status: DC | PRN

## 2011-07-10 MED ORDER — MAGNESIUM SULFATE 40 MG/ML IJ SOLN
2.0000 g | Freq: Once | INTRAMUSCULAR | Status: AC
  Administered 2011-07-10: 2 g via INTRAVENOUS
  Filled 2011-07-10: qty 50

## 2011-07-10 MED ORDER — LORAZEPAM 1 MG PO TABS: 0.0000 mg | ORAL_TABLET | Freq: Four times a day (QID) | ORAL | Status: DC

## 2011-07-10 MED ORDER — FOLIC ACID 1 MG PO TABS: 1.0000 mg | ORAL_TABLET | Freq: Every day | ORAL | Status: DC

## 2011-07-10 MED ORDER — THIAMINE HCL 100 MG/ML IJ SOLN: 100.0000 mg | Freq: Every day | INTRAMUSCULAR | Status: DC

## 2011-07-10 MED ORDER — VITAMIN B-1 100 MG PO TABS
100.0000 mg | ORAL_TABLET | Freq: Every day | ORAL | Status: DC
  Administered 2011-07-10 – 2011-07-11 (×2): 100 mg via ORAL
  Filled 2011-07-10 (×2): qty 1

## 2011-07-10 MED ORDER — ADULT MULTIVITAMIN W/MINERALS CH
1.0000 | ORAL_TABLET | Freq: Every day | ORAL | Status: DC
  Administered 2011-07-10 – 2011-07-11 (×2): 1 via ORAL

## 2011-07-10 MED ORDER — LORAZEPAM 1 MG PO TABS: 0.0000 mg | ORAL_TABLET | Freq: Two times a day (BID) | ORAL | Status: DC

## 2011-07-10 NOTE — Progress Notes (Signed)
Subjective: Patient is feeling better today, she denies any complaints  Objective: Vital signs in last 24 hours: Temp:  [98 F (36.7 C)-98.8 F (37.1 C)] 98 F (36.7 C) (05/24 0458) Pulse Rate:  [74-80] 74  (05/24 0458) Resp:  [18] 18  (05/24 0458) BP: (147)/(84-85) 147/85 mmHg (05/24 0458) SpO2:  [98 %-99 %] 98 % (05/24 0458) Weight:  [53.434 kg (117 lb 12.8 oz)] 53.434 kg (117 lb 12.8 oz) (05/24 0458) Weight change: 3.434 kg (7 lb 9.1 oz) Last BM Date: 07/09/11  Intake/Output from previous day: 05/23 0701 - 05/24 0700 In: -  Out: 351 [Urine:350; Stool:1] Total I/O In: 600 [P.O.:600] Out: 275 [Urine:275]   Physical Exam: General: Alert, awake, oriented x3, in no acute distress. HEENT: No bruits, no goiter. Heart: Regular rate and rhythm, without murmurs, rubs, gallops. Lungs: Clear to auscultation bilaterally. Abdomen: Soft, nontender, nondistended, positive bowel sounds. Extremities: No clubbing cyanosis or edema with positive pedal pulses. Neuro: Grossly intact, nonfocal.    Lab Results: Basic Metabolic Panel:  Basename 07/10/11 0535 07/09/11 0539 07/09/11 0526  NA 138 -- 144  K 3.7 -- 3.3*  CL 100 -- 109  CO2 28 -- 27  GLUCOSE 150* -- 120*  BUN <3* -- 5*  CREATININE 0.53 -- 0.50  CALCIUM 9.4 -- 8.6  MG -- 1.5 --  PHOS -- -- --   Liver Function Tests:  [facility] 07/08/11 1816  AST 41*  ALT 20  ALKPHOS 80  BILITOT 0.2*  PROT 6.2  ALBUMIN 3.2*    Basename 07/08/11 1816  LIPASE 7*  AMYLASE --    Basename 07/08/11 1816  AMMONIA 53   CBC:  Basename 07/09/11 0526 07/08/11 1827 07/08/11 1816  WBC 4.6 -- 5.6  NEUTROABS -- -- 2.8  HGB 11.5* 11.2* --  HCT 31.7* 33.0* --  MCV 87.8 -- 87.2  PLT 173 -- 191   Cardiac Enzymes:  Basename 07/08/11 2039  CKTOTAL 47  CKMB 1.3  CKMBINDEX --  TROPONINI <0.30   BNP: No results found for this basename: PROBNP:3 in the last 72 hours D-Dimer: No results found for this basename: DDIMER:2 in the last  72 hours CBG:  Basename 07/10/11 0739 07/10/11 0550 07/10/11 0306 07/10/11 0107 07/09/11 2027 07/09/11 1817  GLUCAP 96 133* 82 105* 209* 172*   Hemoglobin A1C: No results found for this basename: HGBA1C in the last 72 hours Fasting Lipid Panel: No results found for this basename: CHOL,HDL,LDLCALC,TRIG,CHOLHDL,LDLDIRECT in the last 72 hours Thyroid Function Tests: No results found for this basename: TSH,T4TOTAL,FREET4,T3FREE,THYROIDAB in the last 72 hours Anemia Panel: No results found for this basename: VITAMINB12,FOLATE,FERRITIN,TIBC,IRON,RETICCTPCT in the last 72 hours Coagulation: No results found for this basename: LABPROT:2,INR:2 in the last 72 hours Urine Drug Screen: Drugs of Abuse     Component Value Date/Time   LABOPIA NONE DETECTED 07/08/2011 1946   COCAINSCRNUR NONE DETECTED 07/08/2011 1946   LABBENZ POSITIVE* 07/08/2011 1946   AMPHETMU NONE DETECTED 07/08/2011 1946   THCU NONE DETECTED 07/08/2011 1946   LABBARB NONE DETECTED 07/08/2011 1946    Alcohol Level:  Basename 07/08/11 1816  ETH 319*   Urinalysis:  Basename 07/08/11 1946  COLORURINE YELLOW  LABSPEC <1.005*  PHURINE 5.5  GLUCOSEU 250*  HGBUR NEGATIVE  BILIRUBINUR NEGATIVE  KETONESUR NEGATIVE  PROTEINUR NEGATIVE  UROBILINOGEN 0.2  NITRITE NEGATIVE  LEUKOCYTESUR NEGATIVE    No results found for this or any previous visit (from the past 240 hour(s)).  Studies/Results: Ct Head Wo Contrast  07/08/2011  *  RADIOLOGY REPORT*  Clinical Data: Acute mental status changes.  History of diabetes.  CT HEAD WITHOUT CONTRAST  Technique:  Contiguous axial images were obtained from the base of the skull through the vertex without contrast.  Comparison: Unenhanced cranial CT 02/29/2008.  MRI brain 03/01/2008.  Findings: Slight head tilt in the gantry accounts for apparent asymmetry in the cerebral hemispheres.  Ventricular system normal in size and appearance for age.  Mild changes of small vessel disease of the white  matter.  No mass lesion.  No midline shift. No acute hemorrhage or hematoma.  No extra-axial fluid collections. No evidence of acute infarction.  No significant interval change.  Hyperostosis frontalis interna.  No focal osseous abnormality involving the skull. Visualized paranasal sinuses, mastoid air cells, and middle ear cavities well-aerated.  IMPRESSION:  1.  No acute intracranial abnormality. 2.  Mild chronic microvascular ischemic changes of the white matter.  Original Report Authenticated By: Deniece Portela, M.D.   Dg Chest Portable 1 View  07/08/2011  *RADIOLOGY REPORT*  Clinical Data: Cough.  Altered mental status.  Unresponsive patient.  PORTABLE CHEST - 1 VIEW  Comparison: 06/03/2011  Findings: Low lung volumes are present, causing crowding of the pulmonary vasculature.  Mild atelectasis noted medially at the right lung base.  Cardiac and mediastinal contours appear unremarkable.  No pleural effusion noted.  IMPRESSION:  1.  Low lung volumes. 2.  Mild right medial basilar subsegmental atelectasis.  Original Report Authenticated By: Carron Curie, M.D.    Medications: Scheduled Meds:   . dextrose 5 % and 0.45% NaCl   Intravenous STAT  . folic acid  1 mg Oral Daily  . magnesium sulfate 1 - 4 g bolus IVPB  2 g Intravenous Once  . potassium chloride  40 mEq Oral Once  . sodium chloride  3 mL Intravenous Q12H   Continuous Infusions:   . DISCONTD: dextrose 5 % and 0.9 % NaCl with KCl 20 mEq/L 100 mL/hr at 07/09/11 0243   PRN Meds:.ondansetron (ZOFRAN) IV, ondansetron  Assessment/Plan:  Principal Problem:  *Metabolic encephalopathy Active Problems:  Diabetes mellitus  Hypoglycemia  Alcohol intoxication  Alcohol abuse  Hypokalemia  Nausea & vomiting  Abdominal pain  H/O chronic pancreatitis  Plan:  1. Hypoglycemia in the setting of insulin use and poor po intake.  Patient is currently off of dextrose infusion. Her blood sugars are in the normal range.  We will  restart her insulin at half her usual dose.  We will advance her diet to carb modified  2. Alcohol abuse.  Patient was counseled regarding the importance of alcohol cessation.  She is definitely at risk of going into withdrawal.  We will start CIWA protocol.  3. Metabolic encephalopathy due to hypoglycemia, improved  4. Dispo.  If tolerates solid food and blood sugars remain stable then likely home tomorrow.  Will order home health.   LOS: 2 days   Lucia Harm [facility] Pager: [phone] 07/10/2011, 11:17 AM

## 2011-07-11 DIAGNOSIS — E1169 Type 2 diabetes mellitus with other specified complication: Secondary | ICD-10-CM

## 2011-07-11 DIAGNOSIS — F1010 Alcohol abuse, uncomplicated: Secondary | ICD-10-CM

## 2011-07-11 DIAGNOSIS — G9341 Metabolic encephalopathy: Secondary | ICD-10-CM

## 2011-07-11 LAB — GLUCOSE, CAPILLARY
Glucose-Capillary: 154 mg/dL — ABNORMAL HIGH (ref 70–99)
Glucose-Capillary: 281 mg/dL — ABNORMAL HIGH (ref 70–99)

## 2011-07-11 MED ORDER — LEVOFLOXACIN 500 MG PO TABS: 500.0000 mg | ORAL_TABLET | Freq: Every day | ORAL | Status: AC

## 2011-07-11 NOTE — Progress Notes (Signed)
Pt IV dc'd.  Tolerated procedure well.

## 2011-07-11 NOTE — Discharge Summary (Signed)
Physician Discharge Summary  Patient ID: Charlyne Durnan MRN: [id number] DOB/AGE: 57-Oct-1957 57 y.o. Primary Care Physician:Dr Melina Copa Admit date: 07/08/2011 Discharge date: 07/11/2011    Discharge Diagnoses:  1. Metabolic encephalopathy secondary to hypoglycemia induced from alcohol intoxication. 2. Alcohol abuse. 3. Type 2 diabetes mellitus.   Medication List  As of 07/11/2011  8:32 AM   TAKE these medications         buPROPion 300 MG 24 hr tablet   Commonly known as: WELLBUTRIN XL   Take 300 mg by mouth every morning.      chlordiazePOXIDE 25 MG capsule   Commonly known as: LIBRIUM   Take 25 mg by mouth 4 (four) times daily -  with meals and at bedtime.      disulfiram 250 MG tablet   Commonly known as: ANTABUSE   Take 250 mg by mouth at bedtime.      fenofibrate 145 MG tablet   Commonly known as: TRICOR   Take 145 mg by mouth every morning.      insulin glargine 100 UNIT/ML injection   Commonly known as: LANTUS   Inject 50 Units into the skin at bedtime.      insulin glulisine 100 UNIT/ML injection   Commonly known as: APIDRA   Inject 10 Units into the skin 3 (three) times daily before meals.      lansoprazole 15 MG capsule   Commonly known as: PREVACID   Take 15 mg by mouth 2 (two) times daily.      levofloxacin 500 MG tablet   Commonly known as: LEVAQUIN   Take 1 tablet (500 mg total) by mouth daily.      metoCLOPramide 5 MG tablet   Commonly known as: REGLAN   Take 5 mg by mouth 4 (four) times daily -  with meals and at bedtime.      ondansetron 4 MG disintegrating tablet   Commonly known as: ZOFRAN-ODT   Take 4 mg by mouth every 6 (six) hours as needed. For nausea      PARoxetine 20 MG tablet   Commonly known as: PAXIL   Take 20 mg by mouth every morning.      potassium chloride SA 20 MEQ tablet   Commonly known as: K-DUR,KLOR-CON   Take 20 mEq by mouth 2 (two) times daily.      rOPINIRole 0.5 MG tablet   Commonly known as: REQUIP   Take 0.5 mg  by mouth every evening.      simvastatin 40 MG tablet   Commonly known as: ZOCOR   Take 40 mg by mouth every evening.      traMADol 50 MG tablet   Commonly known as: ULTRAM   Take 100 mg by mouth every 6 (six) hours as needed. For pain      vitamin B-12 500 MCG tablet   Commonly known as: CYANOCOBALAMIN   Take 500 mcg by mouth every morning.            Discharged Condition: Stable and improved.    Consults: None.  Significant Diagnostic Studies: Ct Head Wo Contrast  07/08/2011  *RADIOLOGY REPORT*  Clinical Data: Acute mental status changes.  History of diabetes.  CT HEAD WITHOUT CONTRAST  Technique:  Contiguous axial images were obtained from the base of the skull through the vertex without contrast.  Comparison: Unenhanced cranial CT 02/29/2008.  MRI brain 03/01/2008.  Findings: Slight head tilt in the gantry accounts for apparent asymmetry in the cerebral hemispheres.  Ventricular  system normal in size and appearance for age.  Mild changes of small vessel disease of the white matter.  No mass lesion.  No midline shift. No acute hemorrhage or hematoma.  No extra-axial fluid collections. No evidence of acute infarction.  No significant interval change.  Hyperostosis frontalis interna.  No focal osseous abnormality involving the skull. Visualized paranasal sinuses, mastoid air cells, and middle ear cavities well-aerated.  IMPRESSION:  1.  No acute intracranial abnormality. 2.  Mild chronic microvascular ischemic changes of the white matter.  Original Report Authenticated By: Deniece Portela, M.D.   Dg Chest Portable 1 View  07/08/2011  *RADIOLOGY REPORT*  Clinical Data: Cough.  Altered mental status.  Unresponsive patient.  PORTABLE CHEST - 1 VIEW  Comparison: 06/03/2011  Findings: Low lung volumes are present, causing crowding of the pulmonary vasculature.  Mild atelectasis noted medially at the right lung base.  Cardiac and mediastinal contours appear unremarkable.  No pleural  effusion noted.  IMPRESSION:  1.  Low lung volumes. 2.  Mild right medial basilar subsegmental atelectasis.  Original Report Authenticated By: Carron Curie, M.D.    Lab Results: Basic Metabolic Panel:  Basename 07/10/11 0535 07/09/11 0539 07/09/11 0526  NA 138 -- 144  K 3.7 -- 3.3*  CL 100 -- 109  CO2 28 -- 27  GLUCOSE 150* -- 120*  BUN <3* -- 5*  CREATININE 0.53 -- 0.50  CALCIUM 9.4 -- 8.6  MG -- 1.5 --  PHOS -- -- --   Liver Function Tests:  [facility] 07/08/11 1816  AST 41*  ALT 20  ALKPHOS 80  BILITOT 0.2*  PROT 6.2  ALBUMIN 3.2*     CBC:  Basename 07/09/11 0526 07/08/11 1827 07/08/11 1816  WBC 4.6 -- 5.6  NEUTROABS -- -- 2.8  HGB 11.5* 11.2* --  HCT 31.7* 33.0* --  MCV 87.8 -- 87.2  PLT 173 -- 191       Hospital Course: This 56 year old lady came to the hospital with symptoms of nausea and vomiting, altered mental status and abdominal pain. She was found to have an alcohol level over 300 on admission! She said that she drank vodka. Her blood glucose was only 40 the morning of admission. She has been admitted to the hospital and started on intravenous fluids, her diabetes is been monitored closely and she is doing well. Her mentation is back to normal. She appears to have a  bullous lesion in the left wrist area. She cannot remember how this occurred. In particular, she denies any kind of burn in this area. Discharge Exam: Blood pressure 131/89, pulse 78, temperature 97.6 F (36.4 C), temperature source Oral, resp. rate 16, height 5' (1.524 m), weight 51.665 kg (113 lb 14.4 oz), SpO2 95.00%. She is alert and oriented. Heart sounds are present and normal. Lung fields are clear. As mentioned above she appears to have a 1 x 2 cm bullous lesion with small amount of cellulitis surrounding it.  Disposition: Home. She will continue with her home medications. I've given her a seven-day course of Levaquin for the surrounding cellulitis around a bullous lesion and she  will need to followup regarding this with her primary care physician.  Discharge Orders    Future Orders Please Complete By Expires   Diet - low sodium heart healthy      Increase activity slowly         Follow-up Information    Follow up with [facility].  Follow up with BUTLER,CYNTHIA, DO in 1 week.   Contact information:   [address] [id number] [phone]          SignedDoree Albee Pager [phone]  07/11/2011, 8:32 AM

## 2011-07-11 NOTE — Progress Notes (Signed)
Discharge instructions provided to pt, Erica [name].  Explained new antibiotic to be taken for 7 days and filled at pharmacy of her choice.  She verbalizes understanding of need to f/u with Dr. Melina Copa within 1 week regarding blister on left wrist.

## 2012-02-26 ENCOUNTER — Encounter (HOSPITAL_COMMUNITY): Payer: Self-pay | Admitting: Emergency Medicine

## 2012-02-26 ENCOUNTER — Emergency Department (HOSPITAL_COMMUNITY)
Admission: EM | Admit: 2012-02-26 | Discharge: 2012-02-26 | Disposition: A | Discharge status: Home / Self Care | Payer: Medicaid Other | Attending: Emergency Medicine | Admitting: Emergency Medicine

## 2012-02-26 DIAGNOSIS — R112 Nausea with vomiting, unspecified: Secondary | ICD-10-CM

## 2012-02-26 DIAGNOSIS — Z87442 Personal history of urinary calculi: Secondary | ICD-10-CM | POA: Insufficient documentation

## 2012-02-26 DIAGNOSIS — Z794 Long term (current) use of insulin: Secondary | ICD-10-CM | POA: Insufficient documentation

## 2012-02-26 DIAGNOSIS — Z8719 Personal history of other diseases of the digestive system: Secondary | ICD-10-CM | POA: Insufficient documentation

## 2012-02-26 DIAGNOSIS — E1169 Type 2 diabetes mellitus with other specified complication: Secondary | ICD-10-CM | POA: Insufficient documentation

## 2012-02-26 DIAGNOSIS — Z79899 Other long term (current) drug therapy: Secondary | ICD-10-CM | POA: Insufficient documentation

## 2012-02-26 DIAGNOSIS — F1010 Alcohol abuse, uncomplicated: Secondary | ICD-10-CM

## 2012-02-26 DIAGNOSIS — R739 Hyperglycemia, unspecified: Secondary | ICD-10-CM

## 2012-02-26 LAB — URINALYSIS, ROUTINE W REFLEX MICROSCOPIC
Nitrite: NEGATIVE
Protein, ur: NEGATIVE mg/dL
Specific Gravity, Urine: 1.015 (ref 1.005–1.030)
Urobilinogen, UA: 0.2 mg/dL (ref 0.0–1.0)

## 2012-02-26 LAB — CBC WITH DIFFERENTIAL/PLATELET
Eosinophils Absolute: 0.1 10*3/uL (ref 0.0–0.7)
Eosinophils Relative: 2 % (ref 0–5)
Lymphs Abs: 2.4 10*3/uL (ref 0.7–4.0)
MCH: 31.5 pg (ref 26.0–34.0)
MCV: 90.5 fL (ref 78.0–100.0)
Monocytes Absolute: 0.2 10*3/uL (ref 0.1–1.0)
Platelets: 212 10*3/uL (ref 150–400)
RBC: 4.54 MIL/uL (ref 3.87–5.11)

## 2012-02-26 LAB — COMPREHENSIVE METABOLIC PANEL
BUN: 9 mg/dL (ref 6–23)
Calcium: 10.1 mg/dL (ref 8.4–10.5)
Creatinine, Ser: 0.5 mg/dL (ref 0.50–1.10)
GFR calc Af Amer: >90 mL/min (ref >90)
Glucose, Bld: 281 mg/dL — ABNORMAL HIGH (ref 70–99)
Sodium: 141 mEq/L (ref 135–145)
Total Protein: 7.5 g/dL (ref 6.0–8.3)

## 2012-02-26 LAB — LIPASE, BLOOD: Lipase: 9 U/L — ABNORMAL LOW (ref 11–59)

## 2012-02-26 MED ORDER — HYDROMORPHONE HCL PF 1 MG/ML IJ SOLN: 1.0000 mg | Freq: Once | INTRAMUSCULAR | Status: DC

## 2012-02-26 MED ORDER — SODIUM CHLORIDE 0.9 % IV BOLUS (SEPSIS): 1000.0000 mL | Freq: Once | INTRAVENOUS | Status: DC

## 2012-02-26 MED ORDER — POTASSIUM CHLORIDE CRYS ER 20 MEQ PO TBCR
40.0000 meq | EXTENDED_RELEASE_TABLET | Freq: Once | ORAL | Status: AC
  Administered 2012-02-26: 40 meq via ORAL
  Filled 2012-02-26: qty 2

## 2012-02-26 MED ORDER — ONDANSETRON 8 MG PO TBDP: 8.0000 mg | ORAL_TABLET | Freq: Three times a day (TID) | ORAL | Status: DC | PRN

## 2012-02-26 MED ORDER — ONDANSETRON HCL 4 MG/2ML IJ SOLN: 4.0000 mg | Freq: Once | INTRAMUSCULAR | Status: DC

## 2012-02-26 NOTE — ED Notes (Signed)
Pt reports HX of pancreatitis, pt in gown for exam, states this episode started today, ETOH smell detected

## 2012-02-26 NOTE — ED Notes (Addendum)
Pt had 1 episode of incontinence/urine

## 2012-02-26 NOTE — ED Provider Notes (Signed)
History     CSN: [id number]  Arrival date & time 02/26/12  1608   First MD Initiated Contact with Patient 02/26/12 1617      Chief Complaint  Patient presents with  . Abdominal Pain    (Consider location/radiation/quality/duration/timing/severity/associated sxs/prior treatment) HPI Patient complaining of abdominal pain began this a.m. After drinking alcohol.  Patient with similar episodes in past.  Pain is sharp and comes and goes nonradiating.  Nausea with vomiting x three consisting of clear liquid unable to keep fluids down.  Patient states drinks fifth of vodka per day.  No stool today.  [facility] [place].   Past Medical History  Diagnosis Date  . Chronic diarrhea   . History of kidney stones   . Diabetes mellitus     Past Surgical History  Procedure Date  . Colonoscopy 02/24/2010    No family history on file.  History  Substance Use Topics  . Smoking status: Never Smoker   . Smokeless tobacco: Not on file  . Alcohol Use: Yes     Comment: weekly    OB History    Grav Para Term Preterm Abortions TAB SAB Ect Mult Living                  Review of Systems  All other systems reviewed and are negative.    Allergies  Aspirin  Home Medications   Current Outpatient Rx  Name  Route  Sig  Dispense  Refill  . BUPROPION HCL ER (XL) 300 MG PO TB24   Oral   Take 300 mg by mouth every morning.          Marland Kitchen CHLORDIAZEPOXIDE HCL 25 MG PO CAPS   Oral   Take 25 mg by mouth 4 (four) times daily -  with meals and at bedtime. *To take 30 minutes before meals and at bedtime         . VITAMIN D 1000 UNITS PO TABS   Oral   Take 1,000 Units by mouth daily.         . B-12 100 MCG PO TABS   Oral   Take 1 tablet by mouth daily.         Marland Kitchen ESOMEPRAZOLE MAGNESIUM 40 MG PO CPDR   Oral   Take 40 mg by mouth every morning.         . FENOFIBRATE 145 MG PO TABS   Oral   Take 145 mg by mouth every morning.          Marland Kitchen FOLIC  ACID [id number] MCG PO TABS   Oral   Take 800 mcg by mouth daily.         Marland Kitchen HYDROCODONE-ACETAMINOPHEN 7.5-325 MG PO TABS   Oral   Take 1 tablet by mouth 3 (three) times daily as needed. For pain         . INSULIN ASPART 100 UNIT/ML [place] SOLN   Subcutaneous   Inject 5 Units into the skin 3 (three) times daily before meals.         . INSULIN GLARGINE 100 UNIT/ML [place] SOLN   Subcutaneous   Inject 40 Units into the skin at bedtime.         Marland Kitchen METOCLOPRAMIDE HCL 5 MG PO TABS   Oral   Take 5 mg by mouth 4 (four) times daily -  with meals and at bedtime.         Marland Kitchen POTASSIUM CHLORIDE CRYS ER  20 MEQ PO TBCR   Oral   Take 20 mEq by mouth 2 (two) times daily.         Marland Kitchen ROPINIROLE HCL 0.5 MG PO TABS   Oral   Take 0.5 mg by mouth daily after supper.          Marland Kitchen SIMVASTATIN 40 MG PO TABS   Oral   Take 40 mg by mouth every evening.           There were no vitals taken for this visit.  Physical Exam  Nursing note and vitals reviewed. Constitutional: She appears well-developed and well-nourished.  HENT:  Head: Normocephalic and atraumatic.  Eyes: Conjunctivae normal and EOM are normal. Pupils are equal, round, and reactive to light.  Neck: Normal range of motion. Neck supple.  Cardiovascular: Normal rate, regular rhythm, normal heart sounds and intact distal pulses.   Pulmonary/Chest: Effort normal and breath sounds normal.  Abdominal: Soft. Bowel sounds are normal. She exhibits distension.       Mild epigastric and ruq tenderness with ruq scar  Musculoskeletal: Normal range of motion.  Neurological: She is alert.  Skin: Skin is warm and dry.  Psychiatric: She has a normal mood and affect. Thought content normal.    ED Course  Procedures (including critical care time)  Labs Reviewed - No data to display No results found.   No diagnosis found.    Patient with history of pancreatitis with alcohol abuse in remission for 8 months who began drinking alcohol today.  She  became nauseated and vomited today.  Abdomen is soft and nontender.  Labs are normal, patient feels improved.  She was given clear liquids and drank without vomiting.  Her daughter is here with her and lives with her.  She is advised to not drink alcohol.  She is advised to closely monitor her bs and follow up with her doctor this week.        Shaune Pollack, MD 02/28/12 [phone]

## 2012-02-26 NOTE — ED Notes (Signed)
Charge nurse attempted IV, no IV access at this time, pt's daughter stated she does not want her mom stuck anymore and requests an EJ, stated "they always end up having to stick her neck and can never get it in her arms or legs".

## 2012-02-26 NOTE — ED Notes (Signed)
Attempted IV x6, no vascular access at this time.

## 2012-02-26 NOTE — ED Notes (Signed)
Patient's daughter contacted. Made he aware that patient was to be discharged, daughter stated was coming to pick up patient.

## 2012-02-26 NOTE — ED Notes (Signed)
Pt c/o n/v x 2 days with abd pain today. Pt smells of etoh.

## 2012-03-10 ENCOUNTER — Encounter (HOSPITAL_COMMUNITY): Payer: Self-pay | Admitting: Emergency Medicine

## 2012-03-10 ENCOUNTER — Emergency Department (HOSPITAL_COMMUNITY)
Admission: EM | Admit: 2012-03-10 | Discharge: 2012-03-10 | Disposition: A | Discharge status: Home / Self Care | Payer: Medicaid Other | Attending: Emergency Medicine | Admitting: Emergency Medicine

## 2012-03-10 DIAGNOSIS — E876 Hypokalemia: Secondary | ICD-10-CM | POA: Insufficient documentation

## 2012-03-10 DIAGNOSIS — R197 Diarrhea, unspecified: Secondary | ICD-10-CM | POA: Insufficient documentation

## 2012-03-10 DIAGNOSIS — R112 Nausea with vomiting, unspecified: Secondary | ICD-10-CM | POA: Insufficient documentation

## 2012-03-10 DIAGNOSIS — Z87442 Personal history of urinary calculi: Secondary | ICD-10-CM | POA: Insufficient documentation

## 2012-03-10 DIAGNOSIS — E119 Type 2 diabetes mellitus without complications: Secondary | ICD-10-CM | POA: Insufficient documentation

## 2012-03-10 DIAGNOSIS — F1010 Alcohol abuse, uncomplicated: Secondary | ICD-10-CM

## 2012-03-10 DIAGNOSIS — Z794 Long term (current) use of insulin: Secondary | ICD-10-CM | POA: Insufficient documentation

## 2012-03-10 DIAGNOSIS — F1020 Alcohol dependence, uncomplicated: Secondary | ICD-10-CM | POA: Insufficient documentation

## 2012-03-10 DIAGNOSIS — Z79899 Other long term (current) drug therapy: Secondary | ICD-10-CM | POA: Insufficient documentation

## 2012-03-10 LAB — BASIC METABOLIC PANEL
CO2: 26 mEq/L (ref 19–32)
Calcium: 8.6 mg/dL (ref 8.4–10.5)
Chloride: 100 mEq/L (ref 96–112)
Creatinine, Ser: 0.51 mg/dL (ref 0.50–1.10)
GFR calc Af Amer: >90 mL/min (ref >90)
Sodium: 141 mEq/L (ref 135–145)

## 2012-03-10 LAB — CBC WITH DIFFERENTIAL/PLATELET
Basophils Absolute: 0 10*3/uL (ref 0.0–0.1)
Basophils Relative: 0 % (ref 0–1)
Eosinophils Relative: 1 % (ref 0–5)
Lymphocytes Relative: 43 % (ref 12–46)
MCHC: 35.9 g/dL (ref 30.0–36.0)
Neutro Abs: 2.5 10*3/uL (ref 1.7–7.7)
Platelets: 176 10*3/uL (ref 150–400)
RDW: 11.6 % (ref 11.5–15.5)
WBC: 5.7 10*3/uL (ref 4.0–10.5)

## 2012-03-10 LAB — ETHANOL: Alcohol, Ethyl (B): 143 mg/dL — ABNORMAL HIGH (ref 0–11)

## 2012-03-10 LAB — GLUCOSE, CAPILLARY
Glucose-Capillary: 422 mg/dL — ABNORMAL HIGH (ref 70–99)
Glucose-Capillary: 167 mg/dL — ABNORMAL HIGH (ref 70–99)

## 2012-03-10 MED ORDER — THIAMINE HCL 100 MG/ML IJ SOLN
100.0000 mg | Freq: Once | INTRAMUSCULAR | Status: AC
  Administered 2012-03-10: 100 mg via INTRAVENOUS
  Filled 2012-03-10: qty 2

## 2012-03-10 MED ORDER — SODIUM CHLORIDE 0.9 % IV SOLN: Freq: Once | INTRAVENOUS | Status: DC

## 2012-03-10 MED ORDER — INSULIN ASPART 100 UNIT/ML IV SOLN: 15.0000 [IU] | Freq: Once | INTRAVENOUS | Status: DC

## 2012-03-10 MED ORDER — PANTOPRAZOLE SODIUM 40 MG IV SOLR
40.0000 mg | Freq: Once | INTRAVENOUS | Status: AC
  Administered 2012-03-10: 40 mg via INTRAVENOUS
  Filled 2012-03-10: qty 40

## 2012-03-10 MED ORDER — ONDANSETRON 4 MG PO TBDP: 4.0000 mg | ORAL_TABLET | Freq: Three times a day (TID) | ORAL | Status: DC | PRN

## 2012-03-10 MED ORDER — FOLIC ACID 5 MG/ML IJ SOLN
INTRAMUSCULAR | Status: AC
  Filled 2012-03-10: qty 0.2

## 2012-03-10 MED ORDER — INSULIN ASPART 100 UNIT/ML ~~LOC~~ SOLN
SUBCUTANEOUS | Status: AC
  Administered 2012-03-10: 15 [IU] via INTRAVENOUS
  Filled 2012-03-10: qty 1

## 2012-03-10 MED ORDER — SODIUM CHLORIDE 0.9 % IV BOLUS (SEPSIS): 1000.0000 mL | Freq: Once | INTRAVENOUS | Status: DC

## 2012-03-10 MED ORDER — SODIUM CHLORIDE 0.9 % IV BOLUS (SEPSIS)
1000.0000 mL | Freq: Once | INTRAVENOUS | Status: AC
  Administered 2012-03-10: 1000 mL via INTRAVENOUS

## 2012-03-10 MED ORDER — ONDANSETRON HCL 4 MG/2ML IJ SOLN
4.0000 mg | Freq: Once | INTRAMUSCULAR | Status: AC
  Administered 2012-03-10: 4 mg via INTRAVENOUS
  Filled 2012-03-10: qty 2

## 2012-03-10 MED ORDER — POTASSIUM CHLORIDE CRYS ER 20 MEQ PO TBCR
60.0000 meq | EXTENDED_RELEASE_TABLET | Freq: Once | ORAL | Status: AC
  Administered 2012-03-10: 60 meq via ORAL
  Filled 2012-03-10: qty 3

## 2012-03-10 MED ORDER — FOLIC ACID 5 MG/ML IJ SOLN
1.0000 mg | Freq: Once | INTRAMUSCULAR | Status: AC
  Administered 2012-03-10: 1 mg via INTRAVENOUS

## 2012-03-10 MED ORDER — TAB-A-VITE/IRON PO TABS
1.0000 | ORAL_TABLET | Freq: Every day | ORAL | Status: DC
  Filled 2012-03-10 (×2): qty 1

## 2012-03-10 MED ORDER — ADULT MULTIVITAMIN W/MINERALS CH
ORAL_TABLET | ORAL | Status: AC
  Administered 2012-03-10: 1 via ORAL
  Filled 2012-03-10: qty 1

## 2012-03-10 MED ORDER — POTASSIUM CHLORIDE CRYS ER 20 MEQ PO TBCR: 20.0000 meq | EXTENDED_RELEASE_TABLET | Freq: Every day | ORAL | Status: DC

## 2012-03-10 NOTE — ED Notes (Signed)
Patient c/o generalized lower abdominal cramps - states she had about five loose green stools over the past day.

## 2012-03-10 NOTE — ED Notes (Signed)
Multiple attempts by EMS and 4 RN's to establish IV site.

## 2012-03-10 NOTE — ED Notes (Signed)
Critical Potassium level reported verbally to Dr Olin Hauser.   Advised of limitations of IV access (#24 catheter in left thumb)  Although it did support giving IV fluids meds etc  Order received

## 2012-03-10 NOTE — ED Provider Notes (Addendum)
History     CSN: [id number]  Arrival date & time 03/10/12  0208   First MD Initiated Contact with Patient 03/10/12 [phone]      No chief complaint on file.   (Consider location/radiation/quality/duration/timing/severity/associated sxs/prior treatment) HPI Erica [name] is a 57 y.o. female with a h/o DM, chronic diarrhea, alcohol abuse brought in by ambulance, who presents to the Emergency Department complaining of nausea, vomiting and diarrhea that began yesterday morning and has been present since. She has not taken any of her medicines including her insulin due to the vomiting. She was drinking last, yesterday. Denies fever, chills, cough, shortness of breath.  PCP Dr. Melina Copa Past Medical History  Diagnosis Date  . Chronic diarrhea   . History of kidney stones   . Diabetes mellitus     Past Surgical History  Procedure Date  . Colonoscopy 02/24/2010    No family history on file.  History  Substance Use Topics  . Smoking status: Never Smoker   . Smokeless tobacco: Not on file  . Alcohol Use: Yes     Comment: weekly    OB History    Grav Para Term Preterm Abortions TAB SAB Ect Mult Living                  Review of Systems  Constitutional: Negative for fever.       10 Systems reviewed and are negative for acute change except as noted in the HPI.  HENT: Negative for congestion.   Eyes: Negative for discharge and redness.  Respiratory: Negative for cough and shortness of breath.   Cardiovascular: Negative for chest pain.  Gastrointestinal: Positive for nausea, vomiting and diarrhea. Negative for abdominal pain.  Musculoskeletal: Negative for back pain.  Skin: Negative for rash.  Neurological: Negative for syncope, numbness and headaches.  Psychiatric/Behavioral:       No behavior change.    Allergies  Aspirin  Home Medications   Current Outpatient Rx  Name  Route  Sig  Dispense  Refill  . BUPROPION HCL ER (XL) 300 MG PO TB24   Oral   Take 300 mg by  mouth every morning.          Marland Kitchen CHLORDIAZEPOXIDE HCL 25 MG PO CAPS   Oral   Take 25 mg by mouth 4 (four) times daily -  with meals and at bedtime. *To take 30 minutes before meals and at bedtime         . VITAMIN D 1000 UNITS PO TABS   Oral   Take 1,000 Units by mouth daily.         . B-12 100 MCG PO TABS   Oral   Take 1 tablet by mouth daily.         Marland Kitchen ESOMEPRAZOLE MAGNESIUM 40 MG PO CPDR   Oral   Take 40 mg by mouth every morning.         . FENOFIBRATE 145 MG PO TABS   Oral   Take 145 mg by mouth every morning.          Marland Kitchen FOLIC ACID Q000111Q MCG PO TABS   Oral   Take 800 mcg by mouth daily.         . INSULIN ASPART 100 UNIT/ML [place] SOLN   Subcutaneous   Inject 5 Units into the skin 3 (three) times daily before meals.         . INSULIN GLARGINE 100 UNIT/ML [place] SOLN   Subcutaneous   Inject  40 Units into the skin at bedtime.         Marland Kitchen METOCLOPRAMIDE HCL 5 MG PO TABS   Oral   Take 5 mg by mouth 4 (four) times daily -  with meals and at bedtime.         Marland Kitchen ONDANSETRON 8 MG PO TBDP   Oral   Take 1 tablet (8 mg total) by mouth every 8 (eight) hours as needed for nausea.   20 tablet   0   . POTASSIUM CHLORIDE CRYS ER 20 MEQ PO TBCR   Oral   Take 20 mEq by mouth 2 (two) times daily.         Marland Kitchen ROPINIROLE HCL 0.5 MG PO TABS   Oral   Take 0.5 mg by mouth daily after supper.          Marland Kitchen SIMVASTATIN 40 MG PO TABS   Oral   Take 40 mg by mouth every evening.         Marland Kitchen HYDROCODONE-ACETAMINOPHEN 7.5-325 MG PO TABS   Oral   Take 1 tablet by mouth 3 (three) times daily as needed. For pain           BP 142/82  Pulse 95  Temp 98.7 F (37.1 C) (Oral)  Resp 18  Ht 5' (1.524 m)  Wt 86 lb (39.009 kg)  BMI 16.80 kg/m2  SpO2 99%  Physical Exam  Nursing note and vitals reviewed. Constitutional: She is oriented to person, place, and time.       Awake, alert, nontoxic appearance.Looks older than stated age.   HENT:  Head: Atraumatic.  Eyes: Right eye  exhibits no discharge. Left eye exhibits no discharge.  Neck: Neck supple.  Cardiovascular: Normal heart sounds.   Pulmonary/Chest: Effort normal and breath sounds normal. She exhibits no tenderness.  Abdominal: Soft. There is no tenderness. There is no rebound.  Musculoskeletal: She exhibits no tenderness.       Baseline ROM, no obvious new focal weakness.  Neurological: She is alert and oriented to person, place, and time.       Mental status and motor strength appears baseline for patient and situation.  Skin: No rash noted.  Psychiatric: She has a normal mood and affect.    ED Course  Procedures (including critical care time) Results for orders placed during the hospital encounter of 03/10/12  GLUCOSE, CAPILLARY      Component Value Range   Glucose-Capillary 422 (*) 70 - 99 mg/dL  CBC WITH DIFFERENTIAL      Component Value Range   WBC 5.7  4.0 - 10.5 K/uL   RBC 3.21 (*) 3.87 - 5.11 MIL/uL   Hemoglobin 10.2 (*) 12.0 - 15.0 g/dL   HCT 28.4 (*) 36.0 - 46.0 %   MCV 88.5  78.0 - 100.0 fL   MCH 31.8  26.0 - 34.0 pg   MCHC 35.9  30.0 - 36.0 g/dL   RDW 11.6  11.5 - 15.5 %   Platelets 176  150 - 400 K/uL   Neutrophils Relative 45  43 - 77 %   Neutro Abs 2.5  1.7 - 7.7 K/uL   Lymphocytes Relative 43  12 - 46 %   Lymphs Abs 2.4  0.7 - 4.0 K/uL   Monocytes Relative 11  3 - 12 %   Monocytes Absolute 0.6  0.1 - 1.0 K/uL   Eosinophils Relative 1  0 - 5 %   Eosinophils Absolute 0.1  0.0 - 0.7 K/uL  Basophils Relative 0  0 - 1 %   Basophils Absolute 0.0  0.0 - 0.1 K/uL  BASIC METABOLIC PANEL      Component Value Range   Sodium 141  135 - 145 mEq/L   Potassium 2.0 (*) 3.5 - 5.1 mEq/L   Chloride 100  96 - 112 mEq/L   CO2 26  19 - 32 mEq/L   Glucose, Bld 236 (*) 70 - 99 mg/dL   BUN 7  6 - 23 mg/dL   Creatinine, Ser 0.51  0.50 - 1.10 mg/dL   Calcium 8.6  8.4 - 10.5 mg/dL   GFR calc non Af Amer >90  >90 mL/min   GFR calc Af Amer >90  >90 mL/min  ETHANOL      Component Value Range     Alcohol, Ethyl (B) 143 (*) 0 - 11 mg/dL  GLUCOSE, CAPILLARY      Component Value Range   Glucose-Capillary 167 (*) 70 - 99 mg/dL      MDM   Patient with h/o alcoholism, diabetes here with nausea, vomiting and diarrhea that began yesterday morning. Began fluid resuscitation and was given insulin 15 units. Two liters of fluids and insulin resulted in adequate glucose correction. She was given PPI, antiemetic, folic acid, thiamine and MVC. Labs with low potassium which she was given PO. Able to take PO fluids. Glucose stabilized.  Pt feels improved after observation and/or treatment in ED. She is not interested in alcohol detox or help at the present time. Pt stable in ED with no significant deterioration in condition.The patient appears reasonably screened and/or stabilized for discharge and I doubt any other medical condition or other [facility] requiring further screening, evaluation, or treatment in the ED at this time prior to discharge.  CRITICAL CARE Performed by: Prentiss Bells. Total critical care time: 30 Critical care time was exclusive of separately billable procedures and treating other patients. Critical care was necessary to treat or prevent imminent or life-threatening deterioration. Critical care was time spent personally by me on the following activities: development of treatment plan with patient and/or surrogate as well as nursing, discussions with consultants, evaluation of patient's response to treatment, examination of patient, obtaining history from patient or surrogate, ordering and performing treatments and interventions, ordering and review of laboratory studies, ordering and review of radiographic studies, pulse oximetry and re-evaluation of patient's condition.       Gypsy Balsam. Olin Hauser, MD 03/10/12 [place]. Olin Hauser, MD 03/10/12 [phone]

## 2012-03-10 NOTE — ED Notes (Addendum)
Daughter :  Kerry Fort  Ph # [phone]     Call if patient needs transport home per EMS

## 2012-03-10 NOTE — ED Notes (Signed)
Attempted to call daughter to pick pt up but no answer x 2.

## 2012-03-10 NOTE — ED Notes (Signed)
Attempted to call daughter x 2 again, no answer.

## 2012-03-10 NOTE — ED Notes (Addendum)
States she has vomited multiple times today, did not take her insulin due to vomiting, has consumed about 1/2 pint of liquour today.  Has not taken any of her medications today.  Took all as prescribed on Tuesday 1/21.   States she did take a Zofran tablet from previous visit but it did not help her any.

## 2012-03-10 NOTE — ED Notes (Signed)
Liter #3 order discontinued by Dr Olin Hauser

## 2012-03-10 NOTE — ED Notes (Signed)
Total dose of IV insulin was 15 units novoLog

## 2012-04-27 ENCOUNTER — Other Ambulatory Visit (HOSPITAL_COMMUNITY): Payer: Self-pay | Admitting: Oral Surgery

## 2012-05-04 ENCOUNTER — Other Ambulatory Visit (HOSPITAL_COMMUNITY): Payer: Self-pay | Admitting: Nephrology

## 2012-05-04 DIAGNOSIS — N289 Disorder of kidney and ureter, unspecified: Secondary | ICD-10-CM

## 2012-05-11 ENCOUNTER — Ambulatory Visit (HOSPITAL_COMMUNITY)
Admission: RE | Admit: 2012-05-11 | Discharge: 2012-05-11 | Disposition: A | Discharge status: Home / Self Care | Payer: Medicaid Other | Source: Ambulatory Visit | Attending: Nephrology | Admitting: Nephrology

## 2012-05-11 DIAGNOSIS — N189 Chronic kidney disease, unspecified: Secondary | ICD-10-CM | POA: Insufficient documentation

## 2012-05-11 DIAGNOSIS — N289 Disorder of kidney and ureter, unspecified: Secondary | ICD-10-CM

## 2012-05-19 ENCOUNTER — Inpatient Hospital Stay (HOSPITAL_COMMUNITY)
Admission: EM | Admit: 2012-05-19 | Discharge: 2012-05-21 | DRG: 638 | Disposition: A | Discharge status: Home Health | Payer: Medicaid Other | Attending: Internal Medicine | Admitting: Internal Medicine

## 2012-05-19 ENCOUNTER — Ambulatory Visit (HOSPITAL_COMMUNITY)
Admission: RE | Admit: 2012-05-19 | Discharge: 2012-05-19 | Disposition: A | Discharge status: Home / Self Care | Payer: Medicaid Other | Source: Ambulatory Visit | Attending: Anesthesiology | Admitting: Anesthesiology

## 2012-05-19 ENCOUNTER — Encounter (HOSPITAL_COMMUNITY): Payer: Self-pay | Admitting: *Deleted

## 2012-05-19 ENCOUNTER — Encounter (HOSPITAL_COMMUNITY): Payer: Self-pay

## 2012-05-19 ENCOUNTER — Encounter (HOSPITAL_COMMUNITY)
Admission: RE | Admit: 2012-05-19 | Discharge: 2012-05-19 | Disposition: A | Discharge status: Home / Self Care | Payer: Medicaid Other | Source: Ambulatory Visit | Attending: Oral Surgery | Admitting: Oral Surgery

## 2012-05-19 ENCOUNTER — Other Ambulatory Visit: Payer: Self-pay

## 2012-05-19 ENCOUNTER — Encounter (HOSPITAL_COMMUNITY): Payer: Self-pay | Admitting: Vascular Surgery

## 2012-05-19 ENCOUNTER — Emergency Department (HOSPITAL_COMMUNITY): Payer: Medicaid Other

## 2012-05-19 DIAGNOSIS — R112 Nausea with vomiting, unspecified: Secondary | ICD-10-CM

## 2012-05-19 DIAGNOSIS — Z794 Long term (current) use of insulin: Secondary | ICD-10-CM

## 2012-05-19 DIAGNOSIS — R945 Abnormal results of liver function studies: Secondary | ICD-10-CM | POA: Diagnosis present

## 2012-05-19 DIAGNOSIS — Z8719 Personal history of other diseases of the digestive system: Secondary | ICD-10-CM

## 2012-05-19 DIAGNOSIS — F1010 Alcohol abuse, uncomplicated: Secondary | ICD-10-CM | POA: Diagnosis present

## 2012-05-19 DIAGNOSIS — G4700 Insomnia, unspecified: Secondary | ICD-10-CM | POA: Diagnosis present

## 2012-05-19 DIAGNOSIS — R011 Cardiac murmur, unspecified: Secondary | ICD-10-CM | POA: Diagnosis present

## 2012-05-19 DIAGNOSIS — E1110 Type 2 diabetes mellitus with ketoacidosis without coma: Secondary | ICD-10-CM

## 2012-05-19 DIAGNOSIS — R7401 Elevation of levels of liver transaminase levels: Secondary | ICD-10-CM

## 2012-05-19 DIAGNOSIS — E1310 Other specified diabetes mellitus with ketoacidosis without coma: Principal | ICD-10-CM | POA: Diagnosis present

## 2012-05-19 DIAGNOSIS — G9341 Metabolic encephalopathy: Secondary | ICD-10-CM

## 2012-05-19 DIAGNOSIS — E785 Hyperlipidemia, unspecified: Secondary | ICD-10-CM | POA: Diagnosis present

## 2012-05-19 DIAGNOSIS — Z87442 Personal history of urinary calculi: Secondary | ICD-10-CM

## 2012-05-19 DIAGNOSIS — Z886 Allergy status to analgesic agent status: Secondary | ICD-10-CM

## 2012-05-19 DIAGNOSIS — IMO0002 Reserved for concepts with insufficient information to code with codable children: Secondary | ICD-10-CM

## 2012-05-19 DIAGNOSIS — K3184 Gastroparesis: Secondary | ICD-10-CM | POA: Diagnosis present

## 2012-05-19 DIAGNOSIS — K219 Gastro-esophageal reflux disease without esophagitis: Secondary | ICD-10-CM | POA: Diagnosis present

## 2012-05-19 DIAGNOSIS — G609 Hereditary and idiopathic neuropathy, unspecified: Secondary | ICD-10-CM | POA: Diagnosis present

## 2012-05-19 DIAGNOSIS — E162 Hypoglycemia, unspecified: Secondary | ICD-10-CM

## 2012-05-19 DIAGNOSIS — Z79899 Other long term (current) drug therapy: Secondary | ICD-10-CM

## 2012-05-19 DIAGNOSIS — F329 Major depressive disorder, single episode, unspecified: Secondary | ICD-10-CM | POA: Diagnosis present

## 2012-05-19 DIAGNOSIS — R627 Adult failure to thrive: Secondary | ICD-10-CM | POA: Diagnosis present

## 2012-05-19 DIAGNOSIS — R7989 Other specified abnormal findings of blood chemistry: Secondary | ICD-10-CM | POA: Diagnosis present

## 2012-05-19 DIAGNOSIS — F3289 Other specified depressive episodes: Secondary | ICD-10-CM | POA: Diagnosis present

## 2012-05-19 DIAGNOSIS — F10929 Alcohol use, unspecified with intoxication, unspecified: Secondary | ICD-10-CM

## 2012-05-19 DIAGNOSIS — R64 Cachexia: Secondary | ICD-10-CM | POA: Diagnosis present

## 2012-05-19 DIAGNOSIS — Z23 Encounter for immunization: Secondary | ICD-10-CM

## 2012-05-19 DIAGNOSIS — R413 Other amnesia: Secondary | ICD-10-CM | POA: Diagnosis present

## 2012-05-19 DIAGNOSIS — F1020 Alcohol dependence, uncomplicated: Secondary | ICD-10-CM | POA: Diagnosis present

## 2012-05-19 DIAGNOSIS — E876 Hypokalemia: Secondary | ICD-10-CM

## 2012-05-19 DIAGNOSIS — E860 Dehydration: Secondary | ICD-10-CM | POA: Diagnosis present

## 2012-05-19 HISTORY — DX: Cardiac murmur, unspecified: R01.1

## 2012-05-19 HISTORY — DX: Gastro-esophageal reflux disease without esophagitis: K21.9

## 2012-05-19 HISTORY — DX: Polyneuropathy, unspecified: G62.9

## 2012-05-19 LAB — GLUCOSE, CAPILLARY
Glucose-Capillary: >600 mg/dL (ref 70–99)
Glucose-Capillary: 257 mg/dL — ABNORMAL HIGH (ref 70–99)

## 2012-05-19 LAB — COMPREHENSIVE METABOLIC PANEL
AST: 311 U/L — ABNORMAL HIGH (ref 0–37)
Albumin: 4 g/dL (ref 3.5–5.2)
Alkaline Phosphatase: 152 U/L — ABNORMAL HIGH (ref 39–117)
Alkaline Phosphatase: 139 U/L — ABNORMAL HIGH (ref 39–117)
BUN: 9 mg/dL (ref 6–23)
Chloride: 86 mEq/L — ABNORMAL LOW (ref 96–112)
Chloride: 87 mEq/L — ABNORMAL LOW (ref 96–112)
Creatinine, Ser: 0.51 mg/dL (ref 0.50–1.10)
GFR calc Af Amer: >90 mL/min (ref >90)
Glucose, Bld: 920 mg/dL (ref 70–99)
Potassium: 2.7 mEq/L — CL (ref 3.5–5.1)
Potassium: 2.5 mEq/L — CL (ref 3.5–5.1)
Total Bilirubin: 0.6 mg/dL (ref 0.3–1.2)
Total Bilirubin: 0.6 mg/dL (ref 0.3–1.2)

## 2012-05-19 LAB — BLOOD GAS, ARTERIAL
Acid-Base Excess: 2.5 mmol/L — ABNORMAL HIGH (ref 0.0–2.0)
Bicarbonate: 26.7 mEq/L — ABNORMAL HIGH (ref 20.0–24.0)
O2 Saturation: 93.7 %
TCO2: 23.5 mmol/L (ref 0–100)
pO2, Arterial: 67.6 mmHg — ABNORMAL LOW (ref 80.0–100.0)

## 2012-05-19 LAB — CBC WITH DIFFERENTIAL/PLATELET
Eosinophils Absolute: 0.1 10*3/uL (ref 0.0–0.7)
Eosinophils Relative: 1 % (ref 0–5)
Lymphs Abs: 2.2 10*3/uL (ref 0.7–4.0)
MCH: 31.4 pg (ref 26.0–34.0)
MCV: 87.3 fL (ref 78.0–100.0)
Monocytes Absolute: 0.8 10*3/uL (ref 0.1–1.0)
Platelets: 180 10*3/uL (ref 150–400)
RBC: 4.4 MIL/uL (ref 3.87–5.11)

## 2012-05-19 LAB — CBC
Platelets: 126 10*3/uL — ABNORMAL LOW (ref 150–400)
RDW: 12.3 % (ref 11.5–15.5)
WBC: 5.6 10*3/uL (ref 4.0–10.5)

## 2012-05-19 LAB — POCT I-STAT, CHEM 8
BUN: 5 mg/dL — ABNORMAL LOW (ref 6–23)
Calcium, Ion: 1.19 mmol/L (ref 1.12–1.23)
Chloride: 91 mEq/L — ABNORMAL LOW (ref 96–112)
Glucose, Bld: 628 mg/dL (ref 70–99)

## 2012-05-19 LAB — URINALYSIS, ROUTINE W REFLEX MICROSCOPIC
Bilirubin Urine: NEGATIVE
Glucose, UA: >1000 mg/dL — AB
Hgb urine dipstick: NEGATIVE
Leukocytes, UA: NEGATIVE
Protein, ur: NEGATIVE mg/dL

## 2012-05-19 LAB — BASIC METABOLIC PANEL
BUN: 5 mg/dL — ABNORMAL LOW (ref 6–23)
CO2: 27 mEq/L (ref 19–32)
Glucose, Bld: 194 mg/dL — ABNORMAL HIGH (ref 70–99)
Potassium: 2.2 mEq/L — CL (ref 3.5–5.1)
Sodium: 134 mEq/L — ABNORMAL LOW (ref 135–145)

## 2012-05-19 LAB — TROPONIN I: Troponin I: <0.3 ng/mL (ref <0.30)

## 2012-05-19 MED ORDER — ADULT MULTIVITAMIN W/MINERALS CH
1.0000 | ORAL_TABLET | Freq: Every day | ORAL | Status: DC
  Administered 2012-05-19 – 2012-05-21 (×3): 1 via ORAL
  Filled 2012-05-19 (×3): qty 1

## 2012-05-19 MED ORDER — MAGNESIUM SULFATE 40 MG/ML IJ SOLN
2.0000 g | INTRAMUSCULAR | Status: AC
  Administered 2012-05-19: 2 g via INTRAVENOUS
  Filled 2012-05-19: qty 50

## 2012-05-19 MED ORDER — VITAMIN B-1 100 MG PO TABS
100.0000 mg | ORAL_TABLET | Freq: Every day | ORAL | Status: DC
  Administered 2012-05-19 – 2012-05-21 (×3): 100 mg via ORAL
  Filled 2012-05-19 (×3): qty 1

## 2012-05-19 MED ORDER — PNEUMOCOCCAL VAC POLYVALENT 25 MCG/0.5ML IJ INJ
0.5000 mL | INJECTION | INTRAMUSCULAR | Status: AC
  Administered 2012-05-20: 0.5 mL via INTRAMUSCULAR
  Filled 2012-05-19: qty 0.5

## 2012-05-19 MED ORDER — SODIUM CHLORIDE 0.9 % IV SOLN
INTRAVENOUS | Status: DC
  Administered 2012-05-19: 5.6 [IU]/h via INTRAVENOUS
  Administered 2012-05-20: 2 [IU]/h via INTRAVENOUS
  Filled 2012-05-19: qty 1

## 2012-05-19 MED ORDER — SODIUM CHLORIDE 0.9 % IV SOLN: INTRAVENOUS | Status: DC

## 2012-05-19 MED ORDER — DEXTROSE 50 % IV SOLN: 25.0000 mL | INTRAVENOUS | Status: DC | PRN

## 2012-05-19 MED ORDER — BUPROPION HCL ER (XL) 300 MG PO TB24
300.0000 mg | ORAL_TABLET | Freq: Every morning | ORAL | Status: DC
  Administered 2012-05-20 – 2012-05-21 (×2): 300 mg via ORAL
  Filled 2012-05-19 (×4): qty 1

## 2012-05-19 MED ORDER — SODIUM CHLORIDE 0.9 % IV SOLN: INTRAVENOUS | Status: AC

## 2012-05-19 MED ORDER — SODIUM CHLORIDE 0.9 % IV SOLN
INTRAVENOUS | Status: DC
  Administered 2012-05-19: 1000 mL via INTRAVENOUS

## 2012-05-19 MED ORDER — LORAZEPAM 2 MG/ML IJ SOLN: 0.0000 mg | Freq: Two times a day (BID) | INTRAMUSCULAR | Status: DC

## 2012-05-19 MED ORDER — LORAZEPAM 2 MG/ML IJ SOLN: 0.0000 mg | Freq: Four times a day (QID) | INTRAMUSCULAR | Status: DC

## 2012-05-19 MED ORDER — PANTOPRAZOLE SODIUM 40 MG PO TBEC
40.0000 mg | DELAYED_RELEASE_TABLET | Freq: Every day | ORAL | Status: DC
  Administered 2012-05-20 – 2012-05-21 (×2): 40 mg via ORAL
  Filled 2012-05-19 (×2): qty 1

## 2012-05-19 MED ORDER — LORAZEPAM 2 MG/ML IJ SOLN: 1.0000 mg | Freq: Four times a day (QID) | INTRAMUSCULAR | Status: DC | PRN

## 2012-05-19 MED ORDER — INSULIN REGULAR BOLUS VIA INFUSION
0.0000 [IU] | Freq: Three times a day (TID) | INTRAVENOUS | Status: DC
  Administered 2012-05-20: 0 [IU] via INTRAVENOUS
  Filled 2012-05-19: qty 10

## 2012-05-19 MED ORDER — DEXTROSE-NACL 5-0.45 % IV SOLN: INTRAVENOUS | Status: DC

## 2012-05-19 MED ORDER — ONDANSETRON HCL 4 MG/2ML IJ SOLN: 4.0000 mg | Freq: Three times a day (TID) | INTRAMUSCULAR | Status: AC | PRN

## 2012-05-19 MED ORDER — LORAZEPAM 2 MG/ML IJ SOLN: 0.5000 mg | Freq: Every day | INTRAMUSCULAR | Status: DC

## 2012-05-19 MED ORDER — OXYCODONE HCL 5 MG PO TABS
5.0000 mg | ORAL_TABLET | ORAL | Status: DC | PRN
  Administered 2012-05-20 – 2012-05-21 (×3): 5 mg via ORAL
  Filled 2012-05-19 (×3): qty 1

## 2012-05-19 MED ORDER — POTASSIUM CHLORIDE CRYS ER 20 MEQ PO TBCR
40.0000 meq | EXTENDED_RELEASE_TABLET | Freq: Once | ORAL | Status: DC
  Filled 2012-05-19: qty 2

## 2012-05-19 MED ORDER — POTASSIUM CHLORIDE 10 MEQ/100ML IV SOLN
10.0000 meq | Freq: Once | INTRAVENOUS | Status: AC
  Administered 2012-05-19: 10 meq via INTRAVENOUS
  Filled 2012-05-19: qty 200

## 2012-05-19 MED ORDER — SODIUM CHLORIDE 0.9 % IV BOLUS (SEPSIS)
1000.0000 mL | Freq: Once | INTRAVENOUS | Status: AC
  Administered 2012-05-19: 1000 mL via INTRAVENOUS

## 2012-05-19 MED ORDER — DEXTROSE-NACL 5-0.45 % IV SOLN
INTRAVENOUS | Status: DC
  Administered 2012-05-19 – 2012-05-20 (×2): 1000 mL via INTRAVENOUS

## 2012-05-19 MED ORDER — LORAZEPAM 1 MG PO TABS: 1.0000 mg | ORAL_TABLET | Freq: Four times a day (QID) | ORAL | Status: DC | PRN

## 2012-05-19 MED ORDER — THIAMINE HCL 100 MG/ML IJ SOLN: 100.0000 mg | Freq: Every day | INTRAMUSCULAR | Status: DC

## 2012-05-19 MED ORDER — ENOXAPARIN SODIUM 40 MG/0.4ML ~~LOC~~ SOLN
40.0000 mg | SUBCUTANEOUS | Status: DC
  Administered 2012-05-19 – 2012-05-20 (×2): 40 mg via SUBCUTANEOUS
  Filled 2012-05-19 (×2): qty 0.4

## 2012-05-19 MED ORDER — FOLIC ACID 1 MG PO TABS
1.0000 mg | ORAL_TABLET | Freq: Every day | ORAL | Status: DC
  Administered 2012-05-19 – 2012-05-21 (×3): 1 mg via ORAL
  Filled 2012-05-19 (×3): qty 1

## 2012-05-19 MED ORDER — METOCLOPRAMIDE HCL 5 MG/ML IJ SOLN: 10.0000 mg | Freq: Three times a day (TID) | INTRAMUSCULAR | Status: DC

## 2012-05-19 NOTE — ED Notes (Signed)
[name] (daughter) [phone]

## 2012-05-19 NOTE — Pre-Procedure Instructions (Signed)
Erica [name]  05/19/2012   Your procedure is scheduled on: Monday, April 7,2014  Report to [place] at 5:30 AM.  Call this number if you have problems the morning of surgery: [phone]   Remember:   Do not eat food or drink liquids after midnight.   Take these medicines the morning of surgery with A SIP OF WATER:              buPROPion (WELLBUTRIN XL,  esomeprazole (NEXIUM) and if needed HYDROcodone-acetaminophen (NORCO),   ondansetron (ZOFRAN ODT)      Do not wear jewelry, make-up or nail polish.  Do not wear lotions, powders, or perfumes. You may wear deodorant.  Do not shave 48 hours prior to surgery.   Do not bring valuables to the hospital.  Contacts, dentures or bridgework may not be worn into surgery.  Leave suitcase in the car. After surgery it may be brought to your room.  For patients admitted to the hospital, checkout time is 11:00 AM the day of  discharge.   Patients discharged the day of surgery will not be allowed to drive home.  Name and phone number of your driver: Kerry Fort [phone]  Special Instructions: N/A   Please read over the following fact sheets that you were given: Pain Booklet, Coughing and Deep Breathing and Surgical Site Infection Prevention

## 2012-05-19 NOTE — Pre-Procedure Instructions (Signed)
TAMAH PAGLIUCA  05/19/2012   Your procedure is scheduled on: Monday, April 7,2014  Report to [place] at 5:30 AM.  Call this number if you have problems the morning of surgery: [phone]   Remember:   Do not eat food or drink liquids after midnight.   Take these medicines the morning of surgery with A SIP OF WATER:              buPROPion (WELLBUTRIN XL,  esomeprazole (NEXIUM) and if needed HYDROcodone-acetaminophen (NORCO),   ondansetron (ZOFRAN ODT)      Do not wear jewelry, make-up or nail polish.  Do not wear lotions, powders, or perfumes. You may wear deodorant.  Do not shave 48 hours prior to surgery.   Do not bring valuables to the hospital.  Contacts, dentures or bridgework may not be worn into surgery.  Leave suitcase in the car. After surgery it may be brought to your room.  For patients admitted to the hospital, checkout time is 11:00 AM the day of  discharge.   Patients discharged the day of surgery will not be allowed to drive  home.  Name and phone number of your driver:Latasha Owens Shark [phone]  Special Instructions: N/A   Please read over the following fact sheets that you were given: Pain Booklet, Coughing and Deep Breathing and Surgical Site Infection Prevention

## 2012-05-19 NOTE — Pre-Procedure Instructions (Signed)
BELIZE KRANING  05/19/2012   Your procedure is scheduled on: Monday, April 7,2014  Report to [facility] at 5:30 AM.  Call this number if you have problems the morning of surgery: [phone]   Remember:   Do not eat food or drink liquids after midnight.   Take these medicines the morning of surgery with A SIP OF WATER:              buPROPion (WELLBUTRIN XL,  esomeprazole (NEXIUM) and if needed HYDROcodone-acetaminophen (NORCO),   ondansetron (ZOFRAN ODT)      Do not wear jewelry, make-up or nail polish.  Do not wear lotions, powders, or perfumes. You may wear deodorant.  Do not shave 48 hours prior to surgery.   Do not bring valuables to the hospital.  Contacts, dentures or bridgework may not be worn into surgery.  Leave suitcase in the car. After surgery it may be brought to your room.  For patients admitted to the hospital, checkout time is 11:00 AM the day of  discharge.   Patients discharged the day of surgery will not be allowed to drive home.  Name and phone number of your driver: Roderic Ovens brown [phone]  Special Instructions: N/A   Please read over the following fact sheets that you were given: Pain Booklet, Coughing and Deep Breathing and Surgical Site Infection Prevention

## 2012-05-19 NOTE — Progress Notes (Addendum)
Anesthesia Chart Review:  Patient is a 57 year old female scheduled for multiple teeth extractions with alveoloplasty by Dr. Hoyt Koch on 05/23/12.  Her PAT visit was earlier today.    History includes diabetes mellitus on insulin, peripheral neuropathy, heart murmur (not specified), chronic diarrhea, GERD, non-smoker.  Previous notes also indicate ETOH abuse--with hospitalization [id number] for metabolic encephalopathy secondary to hypoglycemia induced from alcohol intoxication. PCP is listed as Dr. Octavio Graves.  EKG on 07/08/11 showed NSR, possible anterior infarct (age undetermined).  It was not felt significantly changed since 02/29/08.    Echo on 03/01/08 showed: - Overall left ventricular systolic function was vigorous. Left ventricular ejection fraction was estimated , range being 65% to 70 %. There was no diagnostic evidence of left ventricular regional wall motion abnormalities. Left ventricular wall thickness was mildly increased. Features were consistent with mild diastolic dysfunction. - The aortic valve was mildly calcified. There was normal aortic valve leaflet excursion. Transaortic velocity was minimally increased - likely related to flow. The mean transaortic valve gradient was 9 mmHg. - There was mild mitral annular calcification. There was mild mitral valvular regurgitation. - The left atrium was mildly dilated.  CXR on 05/19/12 showed no radiographic evidence of acute cardiopulmonary disease.  After the patient left her PAT appointment, lab called a critical glucose reading of 920.  K+ 2.7. The PAT RN already notified Dr. Lupita Leash office, and patient was instructed to go the the ED.  Also her AST/ALT are elevated at 717/156.  H/H 15.1/41.8.  PLT count 126K.  I'm unsure of etiology of acutely elevated liver enzymes, but she does have a history of ETOH abuse.  She will need her glucose better controlled and improvement in her LFTs prior to proceeding with an elective procedure.  I'll  follow-up ED/hospitalization records tomorrow.   [facility] Short Stay Center/Anesthesiology Phone [phone] 05/19/2012 5:32 PM  Addendum: 05/20/12 1300 Patient is currently admitted to [facility] for DKA.  She is now on SQ insulin, but remains on a KCL gtt for hypokalemia.  A PICC line was placed.  Her glucose is better controlled.  Her LFTs remain elevated, but are trending down--ETOH felt to be the likely cause of acute elevation.  I reviewed above with anesthesiologist Dr. Oletta Lamas.  At this point, it is unclear of the anticipated length of patient's hospitalization.  Even if she is discharged over the weekend, she would need to be cleared by the hospitalists.  I communicated that to patient's daughter Roderic Ovens who is at the hospital with her mom.  Patient's nurse Pam called and spoke with Hospitalist Dr. Conley Canal who felt surgery should be pushed out at least a week.  Pam has already notified the patient and family.  I asked Pam to instruct them to follow-up with Dr. Hoyt Koch regarding rescheduling.  I spoke with Tildon Husky from Dr. Lupita Leash earlier today about plans to postpone surgery and will notify the OR schedulers.

## 2012-05-19 NOTE — ED Notes (Signed)
CRITICAL VALUE ALERT  Critical value received: glucose 620  Date of notification: 05/19/12  Time of notification 1922  Critical value read back:yes  Nurse who received alert:  vermell  MD notified (1st page):  1924 Time of first page:   MD notified (2nd page):  Time of second page:  Responding MD  Sabra Heck Time MD responded: 1924

## 2012-05-19 NOTE — Progress Notes (Signed)
Spoke with Delice Bison at Dr. Lupita Leash office to make him aware of pt abnormal lab results. Potassium was 2.7 and glucose was 920.

## 2012-05-19 NOTE — ED Notes (Addendum)
Pt had critical lab tests done at [facility] for preop to have teeth extracted.  Called to come to ER.  Glucose was 900 and K was 2.7  CBG at triage HI

## 2012-05-19 NOTE — H&P (Signed)
HISTORY AND PHYSICAL  Erica [name] is a 57 y.o. female patient with CC: Painful teeth.  No diagnosis found.  Past Medical History  Diagnosis Date  . Chronic diarrhea   . History of kidney stones   . Diabetes mellitus   . Heart murmur   . Neuropathy     Hx: of  . GERD (gastroesophageal reflux disease)     No current facility-administered medications for this encounter.   Current Outpatient Prescriptions  Medication Sig Dispense Refill  . buPROPion (WELLBUTRIN XL) 300 MG 24 hr tablet Take 300 mg by mouth every morning.       . chlordiazePOXIDE (LIBRIUM) 25 MG capsule Take 25 mg by mouth 4 (four) times daily -  with meals and at bedtime. *To take 30 minutes before meals and at bedtime      . cholecalciferol (VITAMIN D) 1000 UNITS tablet Take 1,000 Units by mouth daily.      . Cyanocobalamin (B-12) 100 MCG TABS Take 1 tablet by mouth daily.      Marland Kitchen esomeprazole (NEXIUM) 40 MG capsule Take 40 mg by mouth every morning.      . fenofibrate (TRICOR) 145 MG tablet Take 145 mg by mouth every morning.       . folic acid (RA FOLIC ACID) Q000111Q MCG tablet Take 800 mcg by mouth daily.      Marland Kitchen HYDROcodone-acetaminophen (NORCO) 7.5-325 MG per tablet Take 1 tablet by mouth 3 (three) times daily as needed. For pain      . insulin aspart (NOVOLOG FLEXPEN) 100 UNIT/ML injection Inject 5 Units into the skin 3 (three) times daily before meals.      . insulin glargine (LANTUS SOLOSTAR) 100 UNIT/ML injection Inject 40 Units into the skin at bedtime.      . metoCLOPramide (REGLAN) 5 MG tablet Take 5 mg by mouth 4 (four) times daily -  with meals and at bedtime.      . ondansetron (ZOFRAN ODT) 4 MG disintegrating tablet Take 1 tablet (4 mg total) by mouth every 8 (eight) hours as needed for nausea.  20 tablet  0  . ondansetron (ZOFRAN ODT) 8 MG disintegrating tablet Take 1 tablet (8 mg total) by mouth every 8 (eight) hours as needed for nausea.  20 tablet  0  . potassium chloride SA (K-DUR,KLOR-CON) 20 MEQ  tablet Take 20 mEq by mouth 2 (two) times daily.      . potassium chloride SA (K-DUR,KLOR-CON) 20 MEQ tablet Take 1 tablet (20 mEq total) by mouth daily.  10 tablet  0  . rOPINIRole (REQUIP) 0.5 MG tablet Take 0.5 mg by mouth daily after supper.       . simvastatin (ZOCOR) 40 MG tablet Take 40 mg by mouth every evening.       Allergies  Allergen Reactions  . Aspirin Palpitations   Active Problems:   * No active hospital problems. *  Vitals: There were no vitals taken for this visit. Lab results:No results found for this or any previous visit (from the past 64 hour(s)). Radiology Results: No results found. General appearance: alert and cooperative Head: Normocephalic, without obvious abnormality, atraumatic Eyes: negative Ears: normal TM's and external ear canals both ears Nose: Nares normal. Septum midline. Mucosa normal. No drainage or sinus tenderness. Throat: Dental caries teeth #'s 18, 19, 20, 21, 22, 24, 25, 27, 28, 29, 30, 31. Edentulous maxilla Neck: no adenopathy, supple, symmetrical, trachea midline and thyroid not enlarged, symmetric, no tenderness/mass/nodules Resp: clear to  auscultation bilaterally Cardio: regular rate and rhythm, S1, S2 normal, no murmur, click, rub or gallop  Assessment:56 Female NIDDM, GERD, Kidney Stones with non-restorableteeth #'s 18, 19, 20, 21, 22, 24, 25, 27, 28, 29, 30, 31.   Plan: Extraction teeth #'s 18, 19, 20, 21, 22, 24, 25, 27, 28, 29, 30, 31 with alveoloplasty. General anesthsia. Day surgery.   Gae Bon 05/19/2012

## 2012-05-19 NOTE — Pre-Procedure Instructions (Signed)
Erica [name]  05/19/2012   Your procedure is scheduled on: Monday, April 7,2014  Report to [facility] at 5:30 AM.  Call this number if you have problems the morning of surgery: [phone]   Remember:   Do not eat food or drink liquids after midnight.   Take these medicines the morning of surgery with A SIP OF WATER:              buPROPion (WELLBUTRIN XL,  esomeprazole (NEXIUM) and if needed HYDROcodone-acetaminophen (NORCO),   ondansetron (ZOFRAN ODT)      Do not wear jewelry, make-up or nail polish.  Do not wear lotions, powders, or perfumes. You may wear deodorant.  Do not shave 48 hours prior to surgery.   Do not bring valuables to the hospital.  Contacts, dentures or bridgework may not be worn into surgery.  Leave suitcase in the car. After surgery it may be brought to your room.  For patients admitted to the hospital, checkout time is 11:00 AM the day of  discharge.   Patients discharged the day of surgery will not be allowed to drive home.  Name and phone number of your driver: -  Special Instructions: Shower using CHG 2 nights before surgery and the night before surgery.  If you shower the day of surgery use CHG.  Use special wash - you have one bottle of CHG for all showers.  You should use approximately 1/3 of the bottle for each shower. N/A   Please read over the following fact sheets that you were given: Pain Booklet, Coughing and Deep Breathing and Surgical Site Infection Prevention

## 2012-05-19 NOTE — Progress Notes (Signed)
Pt denies SOB, chest pain, and seeing a cardiologist.

## 2012-05-19 NOTE — Pre-Procedure Instructions (Signed)
Erica [name]  05/19/2012   Your procedure is scheduled on: Monday, April 7,2014  Report to [place] [facility] at 5:30 AM.  Call this number if you have problems the morning of surgery: [phone]   Remember:   Do not eat food or drink liquids after midnight.   Take these medicines the morning of surgery with A SIP OF WATER:              buPROPion (WELLBUTRIN XL,  esomeprazole (NEXIUM) and if needed HYDROcodone-acetaminophen (NORCO),   ondansetron (ZOFRAN ODT)      Do not wear jewelry, make-up or nail polish.  Do not wear lotions, powders, or perfumes. You may wear deodorant.  Do not shave 48 hours prior to surgery.   Do not bring valuables to the hospital.  Contacts, dentures or bridgework may not be worn into surgery.  Leave suitcase in the car. After surgery it may be brought to your room.  For patients admitted to the hospital, checkout time is 11:00 AM the day of  discharge.   Patients discharged the day of surgery will not be allowed to drive home.  Name and phone number of your driver: Erica [name] [phone]  Special Instructions: N/A   Please read over the following fact sheets that you were given: Pain Booklet, Coughing and Deep Breathing and Surgical Site Infection Prevention

## 2012-05-19 NOTE — ED Provider Notes (Signed)
History     CSN: [id number]  Arrival date & time 05/19/12  1554   First MD Initiated Contact with Patient 05/19/12 1702      No chief complaint on file.   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 She presents with a complaint of high blood sugar and states that over the last 24 hours she has become acutely worse with increased blood sugars, lightheadedness, dizziness, diarrhea and mild shortness of breath. She has mild abdominal pain, headache and states that her symptoms are gradually worsening, persistent and not made better or worse with any specific treatments. She has not taken any insulin in the last 24 hours, she states that she is undergoing preoperative evaluation for dental extractions to happen in the next 4 days  The history is provided by the patient, medical records and a relative.    Past Medical History  Diagnosis Date  . Chronic diarrhea   . History of kidney stones   . Diabetes mellitus   . Heart murmur   . Neuropathy     Hx: of  . GERD (gastroesophageal reflux disease)     Past Surgical History  Procedure Laterality Date  . Colonoscopy  02/24/2010  . Tubal ligation      History reviewed. No pertinent family history.  History  Substance Use Topics  . Smoking status: Never Smoker   . Smokeless tobacco: Not on file  . Alcohol Use: Yes     Comment: weekly    OB History   Grav Para Term Preterm Abortions TAB SAB Ect Mult Living                  Review of Systems  All other systems reviewed and are negative.    Allergies  Aspirin  Home Medications   Current Outpatient Rx  Name  Route  Sig  Dispense  Refill  . buPROPion (WELLBUTRIN XL) 300 MG 24 hr tablet   Oral   Take 300 mg by mouth every morning.          . chlordiazePOXIDE (LIBRIUM)  25 MG capsule   Oral   Take 25 mg by mouth 4 (four) times daily -  with meals and at bedtime. *To take 30 minutes before meals and at bedtime         . cholecalciferol (VITAMIN D) 1000 UNITS tablet   Oral   Take 1,000 Units by mouth daily.         . Cyanocobalamin (B-12) 100 MCG TABS   Oral   Take 1 tablet by mouth daily.         Marland Kitchen esomeprazole (NEXIUM) 40 MG capsule   Oral   Take 40 mg by mouth every morning.         . fenofibrate (TRICOR) 145 MG tablet   Oral   Take 145 mg by mouth every morning.          . folic acid (RA FOLIC ACID) Q000111Q MCG tablet   Oral   Take 800 mcg by mouth daily.         Marland Kitchen HYDROcodone-acetaminophen (NORCO) 7.5-325 MG per tablet   Oral   Take 1 tablet by mouth 3 (three) times daily as needed. For pain         . insulin aspart (NOVOLOG FLEXPEN) 100 UNIT/ML injection   Subcutaneous   Inject 5 Units into the  skin 3 (three) times daily before meals.         . insulin glargine (LANTUS SOLOSTAR) 100 UNIT/ML injection   Subcutaneous   Inject 40 Units into the skin at bedtime.         . metoCLOPramide (REGLAN) 5 MG tablet   Oral   Take 5 mg by mouth 4 (four) times daily -  with meals and at bedtime.         . ondansetron (ZOFRAN ODT) 4 MG disintegrating tablet   Oral   Take 1 tablet (4 mg total) by mouth every 8 (eight) hours as needed for nausea.   20 tablet   0   . ondansetron (ZOFRAN ODT) 8 MG disintegrating tablet   Oral   Take 1 tablet (8 mg total) by mouth every 8 (eight) hours as needed for nausea.   20 tablet   0   . potassium chloride SA (K-DUR,KLOR-CON) 20 MEQ tablet   Oral   Take 20 mEq by mouth 2 (two) times daily.         . potassium chloride SA (K-DUR,KLOR-CON) 20 MEQ tablet   Oral   Take 1 tablet (20 mEq total) by mouth daily.   10 tablet   0   . rOPINIRole (REQUIP) 0.5 MG tablet   Oral   Take 0.5 mg by mouth daily after supper.          . simvastatin (ZOCOR) 40 MG tablet   Oral   Take 40 mg by  mouth every evening.           BP 142/78  Pulse 110  Temp(Src) 99.1 F (37.3 C) (Oral)  Resp 24  Ht 5' (1.524 m)  Wt 90 lb (40.824 kg)  BMI 17.58 kg/m2  SpO2 99%  Physical Exam  Nursing note and vitals reviewed. Constitutional:  Skin, no acute distress, nondiaphoretic  HENT:  Head: Normocephalic and atraumatic.  Mouth/Throat: No oropharyngeal exudate.  Chewing tobacco present in the oral cavity  Eyes: Conjunctivae and EOM are normal. Pupils are equal, round, and reactive to light. Right eye exhibits no discharge. Left eye exhibits no discharge. No scleral icterus.  Neck: Normal range of motion. Neck supple. No JVD present. No thyromegaly present.  Cardiovascular: Regular rhythm, normal heart sounds and intact distal pulses.  Exam reveals no gallop and no friction rub.   No murmur heard. Tachycardia present, strong pulses at the radial arteries, no murmur  Pulmonary/Chest: Effort normal and breath sounds normal. No respiratory distress. She has no wheezes. She has no rales.  Abdominal: Soft. Bowel sounds are normal. She exhibits no distension and no mass. There is tenderness (mild midabdominal tenderness, no guarding, no peritoneal signs).  Musculoskeletal: Normal range of motion. She exhibits no edema and no tenderness.  Lymphadenopathy:    She has no cervical adenopathy.  Neurological: She is alert. Coordination normal.  Skin: Skin is warm and dry. No rash noted. No erythema.  Psychiatric: She has a normal mood and affect. Her behavior is normal.    ED Course  Procedures (including critical care time)  Labs Reviewed  CBC WITH DIFFERENTIAL - Abnormal; Notable for the following:    Monocytes Relative 14 (*)    All other components within normal limits  URINALYSIS, ROUTINE W REFLEX MICROSCOPIC - Abnormal; Notable for the following:    Glucose, UA >1000 (*)    Ketones, ur TRACE (*)    All other components within normal limits  BLOOD GAS, ARTERIAL - Abnormal; Notable  for  the following:    pO2, Arterial 67.6 (*)    Bicarbonate 26.7 (*)    Acid-Base Excess 2.5 (*)    Allens test (pass/fail) NOT INDICATED (*)    All other components within normal limits  POCT I-STAT, CHEM 8 - Abnormal; Notable for the following:    Sodium 132 (*)    Potassium 2.5 (*)    Chloride 91 (*)    BUN 5 (*)    Glucose, Bld 628 (*)    All other components within normal limits  URINE MICROSCOPIC-ADD ON  COMPREHENSIVE METABOLIC PANEL  TROPONIN I   Dg Chest 2 View  05/19/2012  *RADIOLOGY REPORT*  Clinical Data: Teeth extractions.  CHEST - 2 VIEW  Comparison: Chest x-ray 07/08/2011.  Findings: Lung volumes are normal.  No consolidative airspace disease.  No pleural effusions.  No pneumothorax.  No pulmonary nodule or mass noted.  Pulmonary vasculature and the cardiomediastinal silhouette are within normal limits.  IMPRESSION: 1. No radiographic evidence of acute cardiopulmonary disease.   Original Report Authenticated By: Vinnie Langton, M.D.      1. Diabetic ketoacidosis   2. Hypokalemia   3. Transaminitis       MDM  The patient has severe hyperglycemia, she has a tachycardia and some other vague symptoms including diarrhea, abdominal pain, headache. She states that she has had polyuria, polydipsia and at this time appear significantly volume depleted. Multiple attempts at IV access have failed by the nurses as well as myself, we are currently attempting to obtain IV access to fluid resuscitate as well as to start insulin drip.  ED ECG REPORT  I personally interpreted this EKG   Date: 05/19/2012   Rate: 93  Rhythm: normal sinus rhythm  QRS Axis: normal  Intervals: QT prolonged  ST/T Wave abnormalities: nonspecific T wave changes  Conduction Disutrbances:none  Narrative Interpretation:   Old EKG Reviewed: none available  The patient has a severely elevated blood sugar, she will need significant intervention to help with this including fluids and insulin drip, monitor  potassium, check for source of hyperglycemia other than noncompliance.  Patient was noted to be severely hypokalemic less than 2.5, hyperglycemic. Replacement ordered  Angiocath insertion Performed by: Noemi Chapel D  Consent: Verbal consent obtained. Risks and benefits: risks, benefits and alternatives were discussed Time out: Immediately prior to procedure a "time out" was called to verify the correct patient, procedure, equipment, support staff and site/side marked as required.  Preparation: Patient was prepped and draped in the usual sterile fashion.  Vein Location: L EJ  Not Ultrasound Guided  Gauge: 18  Normal blood return and flush without difficulty Patient tolerance: Patient tolerated the procedure well with no immediate complications.  CRITICAL CARE Performed by: Noemi Chapel D  Due 2 severe hyperglycemia with a slight acidosis, ketonuria, severe hypokalemia with a prolonged QT and the need for an insulin drip, consultation with internal medicine for admission to the hospital critical care was delivered. The patient has severe hyperglycemia, appear significantly dehydrated and has an ongoing tachycardia. She was so dehydrated at peripheral IV access was unobtainable until I placed an external jugular vein 18-gauge Angiocath in the left external jugular vein.  In addition review of the patient's medical record from today shows that preoperative labs drawn by the dentist showed an AST of over 700. Patient will go to a step down  Total critical care time: 30  Critical care time was exclusive of separately billable procedures and treating other patients.  Critical  care was necessary to treat or prevent imminent or life-threatening deterioration.  Critical care was time spent personally by me on the following activities: development of treatment plan with patient and/or surrogate as well as nursing, discussions with consultants, evaluation of patient's response to treatment,  examination of patient, obtaining history from patient or surrogate, ordering and performing treatments and interventions, ordering and review of laboratory studies, ordering and review of radiographic studies, pulse oximetry and re-evaluation of patient's condition.       [name], MD 05/19/12 1900

## 2012-05-19 NOTE — Progress Notes (Signed)
CRITICAL VALUE ALERT  Critical value received:  K+ 2.2  Date of notification:  05/19/12  Time of notification:  2326  Critical value read back:yes  Nurse who received alert:  Hardin Negus RN  MD notified (1st page):  Dr. Hilma Favors  Time of first page:  2327  MD notified (2nd page):  Time of second page:  Responding MD:  Dr. Hilma Favors  Time MD responded:  2350

## 2012-05-19 NOTE — Pre-Procedure Instructions (Signed)
Erica [name]  05/19/2012   Your procedure is scheduled on: Monday, April 7,2014  Report to [facility] at 5:30 AM.  Call this number if you have problems the morning of surgery: [phone]   Remember:   Do not eat food or drink liquids after midnight.   Take these medicines the morning of surgery with A SIP OF WATER:              buPROPion (WELLBUTRIN XL,  esomeprazole (NEXIUM) and if needed HYDROcodone-acetaminophen (NORCO),   ondansetron (ZOFRAN ODT)      Do not wear jewelry, make-up or nail polish.  Do not wear lotions, powders, or perfumes. You may wear deodorant.  Do not shave 48 hours prior to surgery.   Do not bring valuables to the hospital.  Contacts, dentures or bridgework may not be worn into surgery.  Leave suitcase in the car. After surgery it may be brought to your room.  For patients admitted to the hospital, checkout time is 11:00 AM the day of  discharge.   Patients discharged the day of surgery will not be allowed to drive  home.  Name and phone number of your driver:Erica [name] [phone]  Special Instructions: N/A   Please read over the following fact sheets that you were given: Pain Booklet, Coughing and Deep Breathing and Surgical Site Infection Prevention

## 2012-05-20 ENCOUNTER — Inpatient Hospital Stay (HOSPITAL_COMMUNITY): Payer: Medicaid Other

## 2012-05-20 DIAGNOSIS — E1110 Type 2 diabetes mellitus with ketoacidosis without coma: Secondary | ICD-10-CM | POA: Diagnosis present

## 2012-05-20 DIAGNOSIS — E876 Hypokalemia: Secondary | ICD-10-CM

## 2012-05-20 DIAGNOSIS — R945 Abnormal results of liver function studies: Secondary | ICD-10-CM | POA: Diagnosis present

## 2012-05-20 DIAGNOSIS — F1010 Alcohol abuse, uncomplicated: Secondary | ICD-10-CM

## 2012-05-20 DIAGNOSIS — R7989 Other specified abnormal findings of blood chemistry: Secondary | ICD-10-CM | POA: Diagnosis present

## 2012-05-20 LAB — BASIC METABOLIC PANEL
BUN: 5 mg/dL — ABNORMAL LOW (ref 6–23)
BUN: 4 mg/dL — ABNORMAL LOW (ref 6–23)
CO2: 28 mEq/L (ref 19–32)
CO2: 25 mEq/L (ref 19–32)
Chloride: 99 mEq/L (ref 96–112)
Chloride: 102 mEq/L (ref 96–112)
Creatinine, Ser: 0.41 mg/dL — ABNORMAL LOW (ref 0.50–1.10)
Glucose, Bld: 164 mg/dL — ABNORMAL HIGH (ref 70–99)
Glucose, Bld: 69 mg/dL — ABNORMAL LOW (ref 70–99)
Potassium: 2 mEq/L — CL (ref 3.5–5.1)

## 2012-05-20 LAB — GLUCOSE, CAPILLARY
Glucose-Capillary: 103 mg/dL — ABNORMAL HIGH (ref 70–99)
Glucose-Capillary: 163 mg/dL — ABNORMAL HIGH (ref 70–99)
Glucose-Capillary: 163 mg/dL — ABNORMAL HIGH (ref 70–99)
Glucose-Capillary: 182 mg/dL — ABNORMAL HIGH (ref 70–99)
Glucose-Capillary: 270 mg/dL — ABNORMAL HIGH (ref 70–99)
Glucose-Capillary: 460 mg/dL — ABNORMAL HIGH (ref 70–99)
Glucose-Capillary: 467 mg/dL — ABNORMAL HIGH (ref 70–99)
Glucose-Capillary: 478 mg/dL — ABNORMAL HIGH (ref 70–99)

## 2012-05-20 LAB — TSH: TSH: 0.425 u[IU]/mL (ref 0.350–4.500)

## 2012-05-20 MED ORDER — INSULIN ASPART 100 UNIT/ML ~~LOC~~ SOLN
5.0000 [IU] | Freq: Three times a day (TID) | SUBCUTANEOUS | Status: DC
  Administered 2012-05-20: 5 [IU] via SUBCUTANEOUS

## 2012-05-20 MED ORDER — BIOTENE DRY MOUTH MT LIQD
15.0000 mL | Freq: Two times a day (BID) | OROMUCOSAL | Status: DC
  Administered 2012-05-20 (×2): 15 mL via OROMUCOSAL

## 2012-05-20 MED ORDER — PRO-STAT SUGAR FREE PO LIQD
30.0000 mL | Freq: Three times a day (TID) | ORAL | Status: DC
  Administered 2012-05-20 – 2012-05-21 (×2): 30 mL via ORAL
  Filled 2012-05-20 (×2): qty 30

## 2012-05-20 MED ORDER — FOLIC ACID 800 MCG PO TABS: 800.0000 ug | ORAL_TABLET | Freq: Every day | ORAL | Status: DC

## 2012-05-20 MED ORDER — INSULIN GLARGINE 100 UNIT/ML ~~LOC~~ SOLN
40.0000 [IU] | Freq: Every day | SUBCUTANEOUS | Status: DC
  Administered 2012-05-20: 40 [IU] via SUBCUTANEOUS
  Filled 2012-05-20 (×3): qty 0.4

## 2012-05-20 MED ORDER — INSULIN ASPART 100 UNIT/ML ~~LOC~~ SOLN
12.0000 [IU] | Freq: Three times a day (TID) | SUBCUTANEOUS | Status: DC
  Administered 2012-05-20 – 2012-05-21 (×2): 12 [IU] via SUBCUTANEOUS

## 2012-05-20 MED ORDER — POTASSIUM CHLORIDE 10 MEQ/100ML IV SOLN
INTRAVENOUS | Status: AC
  Filled 2012-05-20: qty 300

## 2012-05-20 MED ORDER — SODIUM CHLORIDE 0.9 % IJ SOLN
10.0000 mL | Freq: Two times a day (BID) | INTRAMUSCULAR | Status: DC
  Administered 2012-05-20: 10 mL

## 2012-05-20 MED ORDER — ROPINIROLE HCL 1 MG PO TABS
1.0000 mg | ORAL_TABLET | Freq: Every day | ORAL | Status: DC
  Administered 2012-05-20: 1 mg via ORAL
  Filled 2012-05-20 (×3): qty 1

## 2012-05-20 MED ORDER — POTASSIUM CHLORIDE CRYS ER 20 MEQ PO TBCR
40.0000 meq | EXTENDED_RELEASE_TABLET | Freq: Two times a day (BID) | ORAL | Status: DC
  Administered 2012-05-20: 40 meq via ORAL
  Filled 2012-05-20: qty 2

## 2012-05-20 MED ORDER — SODIUM CHLORIDE 0.9 % IJ SOLN: 10.0000 mL | INTRAMUSCULAR | Status: DC | PRN

## 2012-05-20 MED ORDER — POTASSIUM CHLORIDE CRYS ER 20 MEQ PO TBCR
40.0000 meq | EXTENDED_RELEASE_TABLET | Freq: Three times a day (TID) | ORAL | Status: DC
  Administered 2012-05-20 – 2012-05-21 (×4): 40 meq via ORAL
  Filled 2012-05-20 (×3): qty 2

## 2012-05-20 MED ORDER — POTASSIUM CHLORIDE 10 MEQ/100ML IV SOLN
10.0000 meq | INTRAVENOUS | Status: AC
  Administered 2012-05-20 (×3): 10 meq via INTRAVENOUS

## 2012-05-20 MED ORDER — CHLORHEXIDINE GLUCONATE 0.12 % MT SOLN
15.0000 mL | Freq: Two times a day (BID) | OROMUCOSAL | Status: DC
  Administered 2012-05-20 – 2012-05-21 (×3): 15 mL via OROMUCOSAL
  Filled 2012-05-20 (×3): qty 15

## 2012-05-20 MED ORDER — INSULIN GLARGINE 100 UNIT/ML ~~LOC~~ SOLN
5.0000 [IU] | Freq: Once | SUBCUTANEOUS | Status: AC
  Administered 2012-05-20: 5 [IU] via SUBCUTANEOUS
  Filled 2012-05-20: qty 0.05

## 2012-05-20 MED ORDER — POTASSIUM CHLORIDE IN NACL 40-0.9 MEQ/L-% IV SOLN
INTRAVENOUS | Status: DC
  Administered 2012-05-20: 20 mL/h via INTRAVENOUS

## 2012-05-20 NOTE — Care Management Note (Signed)
    Page 1 of 1   05/20/2012     2:28:31 PM   CARE MANAGEMENT NOTE 05/20/2012  Patient:  Erica [name], Erica [name]   Account Number:  [id number]  Date Initiated:  05/20/2012  Documentation initiated by:  Claretha Cooper  Subjective/Objective Assessment:   Per CSW, pt admitted from home where she lives with her daughter. CAPs aid 7 days a week. No additional needs anticipated.     Action/Plan:   Anticipated DC Date:  05/21/2012   Anticipated DC Plan:  [facility] referral  Clinical Social Worker      DC Planning Services  CM consult      Choice offered to / List presented to:             Status of service:  Completed, signed off Medicare Important Message given?   (If response is "NO", the following Medicare IM given date fields will be blank) Date Medicare IM given:   Date Additional Medicare IM given:    Discharge Disposition:  [facility]  Per UR Regulation:    If discussed at Long Length of Stay Meetings, dates discussed:    Comments:  05/20/12 Claretha Cooper RN BSN CM

## 2012-05-20 NOTE — Plan of Care (Signed)
Problem: Consults Goal: Diabetes Mellitus Patient Education See Patient Education Module for education specifics. Outcome: Progressing Patient admitted with DKA Goal: Diagnosis-Diabetes Mellitus Hyperglycemia Glucostabilizer and monitoring of anion gap Goal: Skin Care Protocol Initiated - if Braden Score 18 or less If consults are not indicated, leave blank or document N/A Outcome: Progressing Turning and barrier cream for incontinent patient Goal: Nutrition Consult-if indicated Outcome: Progressing Patient is emaciated, states she has lost 85 lbs since November when she lost her husband  Problem: Phase I Progression Outcomes Goal: NPO or per MD order Outcome: Progressing Carb modified diet with carb coverage on glucostabilizer Goal: Pain controlled with appropriate interventions Outcome: Not Applicable Date Met:  Q000111Q No complaints of pain Goal: OOB as tolerated unless otherwise ordered Outcome: Progressing Only with assist Goal: Initial discharge plan identified Outcome: Erica [name] lives with daughter

## 2012-05-20 NOTE — Progress Notes (Signed)
Report given to Russ Halo, RN.  Patient ready for transfer to department 300 room 313.  Patient denies pain or discomfort.  No acute distress noted.

## 2012-05-20 NOTE — Progress Notes (Signed)
Dineen Kid rn made aware that picc is ok to use

## 2012-05-20 NOTE — Progress Notes (Signed)
Chart reviewed. Immediately upon entering the room, patient's daughter became belligerent. Would not allow me to answer questions.  Subjective: The patient feels tired. Won't answer many questions.  Objective: Vital signs in last 24 hours: Filed Vitals:   05/20/12 0300 05/20/12 0400 05/20/12 0500 05/20/12 0800  BP: 116/65 114/65 106/66   Pulse:      Temp:    99 F (37.2 C)  TempSrc:    Oral  Resp: 15 20 15    Height:      Weight:   45.8 kg (100 lb 15.5 oz)   SpO2:       Weight change:   Intake/Output Summary (Last 24 hours) at 05/20/12 0959 Last data filed at 05/20/12 0500  Gross per 24 hour  Intake 1039.12 ml  Output      0 ml  Net 1039.12 ml   Gen:  Groggy Lungs clear to auscultation bilaterally without wheeze rhonchi or rales Cardiovascular regular rate rhythm without murmurs gallops rubs Abdomen soft nontender nondistended Extremity is no clubbing cyanosis or edema  Lab Results: Basic Metabolic Panel:  Recent Labs Lab 05/20/12 0037 05/20/12 0511  NA 136 137  K 2.0* 3.0*  CL 99 102  CO2 28 25  GLUCOSE 164* 69*  BUN 5* 4*  CREATININE 0.44* 0.41*  CALCIUM 9.0 9.1   Liver Function Tests:  Recent Labs Lab 05/19/12 1330 05/19/12 1814  AST 717* 311*  ALT 156* 106*  ALKPHOS 152* 139*  BILITOT 0.6 0.6  PROT 7.4 6.6  ALBUMIN 4.1 4.0   No results found for this basename: LIPASE, AMYLASE,  in the last 168 hours No results found for this basename: AMMONIA,  in the last 168 hours CBC:  Recent Labs Lab 05/19/12 1330 05/19/12 1745 05/19/12 1757  WBC 5.6 6.1  --   NEUTROABS  --  3.0  --   HGB 15.1* 13.8 13.9  HCT 41.8 38.4 41.0  MCV 88.4 87.3  --   PLT 126* 180  --    Cardiac Enzymes:  Recent Labs Lab 05/19/12 1814  TROPONINI <0.30   BNP: No results found for this basename: PROBNP,  in the last 168 hours D-Dimer: No results found for this basename: DDIMER,  in the last 168 hours CBG:  Recent Labs Lab 05/20/12 0116 05/20/12 0250  05/20/12 0446 05/20/12 0553 05/20/12 0712 05/20/12 0804  GLUCAP 163* 157* 99 103* 154* 182*   Hemoglobin A1C: No results found for this basename: HGBA1C,  in the last 168 hours Fasting Lipid Panel: No results found for this basename: CHOL, HDL, LDLCALC, TRIG, CHOLHDL, LDLDIRECT,  in the last 168 hours Thyroid Function Tests: No results found for this basename: TSH, T4TOTAL, FREET4, T3FREE, THYROIDAB,  in the last 168 hours Coagulation:  Recent Labs Lab 05/19/12 2020  LABPROT 12.5  INR 0.94   Anemia Panel: No results found for this basename: VITAMINB12, FOLATE, FERRITIN, TIBC, IRON, RETICCTPCT,  in the last 168 hours Urine Drug Screen: Drugs of Abuse    Alcohol Level:  Recent Labs Lab 05/19/12 2020  [place] <11   Urinalysis:  Recent Labs Lab 05/19/12 1751  COLORURINE YELLOW  LABSPEC 1.010  PHURINE 5.5  GLUCOSEU >1000*  HGBUR NEGATIVE  BILIRUBINUR NEGATIVE  KETONESUR TRACE*  PROTEINUR NEGATIVE  UROBILINOGEN 0.2  NITRITE NEGATIVE  LEUKOCYTESUR NEGATIVE   Micro Results: Recent Results (from the past 240 hour(s))  MRSA PCR SCREENING     Status: None   Collection Time    05/19/12  8:48 PM  Result Value Range Status   MRSA by PCR NEGATIVE  NEGATIVE Final   Comment:            The GeneXpert MRSA Assay (FDA     approved for NASAL specimens     only), is one component of a     comprehensive MRSA colonization     surveillance program. It is not     intended to diagnose MRSA     infection nor to guide or     monitor treatment for     MRSA infections.   Studies/Results: Dg Chest 2 View  05/19/2012  *RADIOLOGY REPORT*  Clinical Data: Short of breath  CHEST - 2 VIEW  Comparison: 05/19/2012  Findings: Heart size is normal.  Vascularity normal.  Lungs are clear without infiltrate or effusion.  No change from prior study.  IMPRESSION: No active cardiopulmonary abnormality.   Original Report Authenticated By: Carl Best, M.D.    Dg Chest 2 View  05/19/2012   *RADIOLOGY REPORT*  Clinical Data: Teeth extractions.  CHEST - 2 VIEW  Comparison: Chest x-ray 07/08/2011.  Findings: Lung volumes are normal.  No consolidative airspace disease.  No pleural effusions.  No pneumothorax.  No pulmonary nodule or mass noted.  Pulmonary vasculature and the cardiomediastinal silhouette are within normal limits.  IMPRESSION: 1. No radiographic evidence of acute cardiopulmonary disease.   Original Report Authenticated By: Vinnie Langton, M.D.    Scheduled Meds: . antiseptic oral rinse  15 mL Mouth Rinse q12n4p  . buPROPion  300 mg Oral q morning - 10a  . chlorhexidine  15 mL Mouth Rinse BID  . enoxaparin (LOVENOX) injection  40 mg Subcutaneous Q24H  . folic acid  1 mg Oral Daily  . insulin aspart  5 Units Subcutaneous TID AC  . insulin glargine  40 Units Subcutaneous QHS  . insulin glargine  5 Units Subcutaneous Once  . LORazepam  0-4 mg Intravenous Q6H   Followed by  . [START ON 05/21/2012] LORazepam  0-4 mg Intravenous Q12H  . LORazepam  0.5 mg Intravenous QHS  . multivitamin with minerals  1 tablet Oral Daily  . pantoprazole  40 mg Oral Daily  . pneumococcal 23 valent vaccine  0.5 mL Intramuscular Tomorrow-1000  . potassium chloride SA  40 mEq Oral Once  . potassium chloride  40 mEq Oral TID  . sodium chloride  10-40 mL Intracatheter Q12H  . thiamine  100 mg Oral Daily   Or  . thiamine  100 mg Intravenous Daily   Continuous Infusions: . 0.9 % NaCl with KCl 40 mEq / L     PRN Meds:.dextrose, LORazepam, LORazepam, oxyCODONE, sodium chloride Assessment/Plan:  Principal Problem:   DKA, type 2 Active Problems:   Alcohol abuse   Hypokalemia   Abnormal LFTs   Heart murmur, systolic  patient's anion gap has normalized. Will resume subcutaneous insulin. She has been off the drip. Potassium improved. Continue repletion. Transferred to telemetry. No evidence of DTs. PICC line placed. Abnormal LFTs likely related to alcohol abuse. Will follow.   LOS: 1 day    Breland Trouten L 05/20/2012, 9:59 AM

## 2012-05-20 NOTE — Progress Notes (Signed)
Inpatient Diabetes Program Recommendations  AACE/ADA: New Consensus Statement on Inpatient Glycemic Control (2013)  Target Ranges:  Prepandial:   less than 140 mg/dL      Peak postprandial:   less than 180 mg/dL (1-2 hours)      Critically ill patients:  140 - 180 mg/dL   Results for Erica [name], Erica [name] (MRN [id number]) as of 05/20/2012 13:11  Ref. Range 05/20/2012 02:50 05/20/2012 04:46 05/20/2012 05:53 05/20/2012 07:12 05/20/2012 08:04  Glucose-Capillary Latest Range: 70-99 mg/dL 157 (H) 99 103 (H) 154 (H) 182 (H)    Inpatient Diabetes Program Recommendations Correction (SSI): May want to consider ordeirng Novolog correction scale.  Note: Initial lab glucose noted to be 920 mg/dl on 05/19/12.  Patient has a history of diabetes and takes  Lantus 40 units QHS and Novolog 5 units TID at home for diabetes management.  Patient was on an insulin drip and has now transitioned to SQ insulin.  Currently, patient is ordered to receive Lantus 40 units QHS and Novolog 5 units TID meal coverage for inpatient glycemic control.  May also want to consider ordering Novolog correction insulin ACHS.  Will continue to follow as an inpatient.  Thanks, Barnie Alderman, RN, BSN, [facility] Diabetes Coordinator Inpatient Diabetes Program [phone]

## 2012-05-20 NOTE — Progress Notes (Signed)
INITIAL NUTRITION ASSESSMENT  DOCUMENTATION CODES Per approved criteria  -Severe malnutrition in the context of chronic illness   INTERVENTION:  Diet education DM provided   ProStat 30 ml TID (each 30 ml provides 100 kcal, 15 gr protein)  NUTRITION DIAGNOSIS: Food and Nutrtiion knowledge deficit related to uncertainty how to apply nutrition information  Goal: Pt to meet >/= 90% of their estimated nutrition needs   Monitor:  Po intake, labs and wt trends  Reason for Assessment: Malnurtriton Screen  57 y.o. female  Admitting Dx: DKA, type 2  ASSESSMENT: Pt diet hx recall shows pt usually eats one meal daily in the evening. She has received diet education for DM previously but doesn't practice diet guidelines. Hx of ETOH abuse likely contributing to her malnourished state. She has severe temporal and clavicle muscle wasting and fat depletion and meets criteria for Severe malnutrition in the context of chronic illness. .   Lab Results  Component Value Date   HGBA1C  Value: 9.8 (NOTE) The ADA recommends the following therapeutic goal for glycemic control related to Hgb A1c measurement: Goal of therapy: <6.5 Hgb A1c  Reference: American Diabetes Association: Clinical Practice Recommendations 2010, Diabetes Care, 2010, 33: (Suppl  1).* 04/06/2009    RD provided "Carbohydrate Counting for People with Diabetes" handout from the Academy of Nutrition and Dietetics. Discussed different food groups and their effects on blood sugar, emphasizing carbohydrate-containing foods. Provided list of carbohydrates and recommended serving sizes of common foods.  Discussed importance of protein and controlled, consistent carbohydrate intake throughout the day. Provided examples of ways to balance meals/snacks and encouraged intake of high-fiber, whole grain complex carbohydrates. Teach back method used.  Expect fair compliance.   Height: Ht Readings from Last 1 Encounters:  05/19/12 5' (1.524 m)     Weight: Wt Readings from Last 1 Encounters:  05/20/12 100 lb 15.5 oz (45.8 kg)    Ideal Body Weight: 100# (45.4 kg)  % Ideal Body Weight: 100%  Wt Readings from Last 10 Encounters:  05/20/12 100 lb 15.5 oz (45.8 kg)  05/19/12 90 lb 11.2 oz (41.141 kg)  03/10/12 86 lb (39.009 kg)  07/11/11 113 lb 14.4 oz (51.665 kg)    Usual Body Weight: variable per pt  % Usual Body Weight: n/a  BMI:  Body mass index is 19.72 kg/(m^2).Normal range  Estimated Nutritional Needs: Kcal: 1380-1610  Protein: 55-65 gr Fluid: >1600 ml/day  Skin: No issues noted  Diet Order: Carb Control  EDUCATION NEEDS: -Education needs addressed   Intake/Output Summary (Last 24 hours) at 05/20/12 1125 Last data filed at 05/20/12 0500  Gross per 24 hour  Intake 1039.12 ml  Output      0 ml  Net 1039.12 ml    Last BM: 05/20/12  Labs:   Recent Labs Lab 05/19/12 2249 05/20/12 0037 05/20/12 0511  NA 134* 136 137  K 2.2* 2.0* 3.0*  CL 96 99 102  CO2 27 28 25   BUN 5* 5* 4*  CREATININE 0.49* 0.44* 0.41*  CALCIUM 9.0 9.0 9.1  GLUCOSE 194* 164* 69*    CBG (last 3)   Recent Labs  05/20/12 0553 05/20/12 0712 05/20/12 0804  GLUCAP 103* 154* 182*    Scheduled Meds: . antiseptic oral rinse  15 mL Mouth Rinse q12n4p  . buPROPion  300 mg Oral q morning - 10a  . chlorhexidine  15 mL Mouth Rinse BID  . enoxaparin (LOVENOX) injection  40 mg Subcutaneous Q24H  . folic acid  1  mg Oral Daily  . insulin aspart  5 Units Subcutaneous TID AC  . insulin glargine  40 Units Subcutaneous QHS  . LORazepam  0-4 mg Intravenous Q6H   Followed by  . [START ON 05/21/2012] LORazepam  0-4 mg Intravenous Q12H  . LORazepam  0.5 mg Intravenous QHS  . multivitamin with minerals  1 tablet Oral Daily  . pantoprazole  40 mg Oral Daily  . potassium chloride SA  40 mEq Oral Once  . potassium chloride  40 mEq Oral TID  . sodium chloride  10-40 mL Intracatheter Q12H  . thiamine  100 mg Oral Daily   Or  .  thiamine  100 mg Intravenous Daily    Continuous Infusions: . 0.9 % NaCl with KCl 40 mEq / L 20 mL/hr (05/20/12 1021)    Past Medical History  Diagnosis Date  . Chronic diarrhea   . History of kidney stones   . Diabetes mellitus   . Heart murmur   . Neuropathy     Hx: of  . GERD (gastroesophageal reflux disease)     Past Surgical History  Procedure Laterality Date  . Colonoscopy  02/24/2010  . Tubal ligation      Colman Cater MS,RD,LDN,CSG Office: [phone] Pager: [phone]

## 2012-05-20 NOTE — H&P (Addendum)
[facility] History and Physical  Erica [name] [id number] DOB: 23-Nov-1955 DOA: 05/19/2012  Referring physician: EDP Bednar PCP: Octavio Graves, DO  Specialists: none  Chief Complaint: Hyperglycemia  HPI: Erica [name] is a 57 y.o. female with a past medical history significant for type 2 diabetes, alcoholism, depression, GERD, and hyperlipidemia who presented to the emergency department after she had routine blood work done at a pre-op visit for tooth extraction today that showed a potassium of 2.7, abnormal liver function tests and a blood glucose reading of greater than 900. The patient reports that she has not had any of her medication including insulin since Tuesday, she tells me that she "I just forgot".    in the ED the pt was lethargic, but alert and oriented. She had an anion gap of 19, and was clinically dehydrated. Admission requested for treatment and management of DKA.    Review of Systems: Review of Systems  Constitutional: Positive for weight loss, malaise/fatigue and diaphoresis. Negative for fever and chills.  HENT: Negative for nosebleeds, congestion and sore throat.   Eyes: Positive for blurred vision and redness. Negative for double vision, photophobia, pain and discharge.  Respiratory: Negative for cough, sputum production and shortness of breath.   Cardiovascular: Negative for chest pain, palpitations, orthopnea, claudication, leg swelling and PND.  Gastrointestinal: Positive for heartburn and nausea. Negative for vomiting, abdominal pain, diarrhea, constipation, blood in stool and melena.  Genitourinary: Positive for dysuria.  Musculoskeletal: Positive for myalgias.  Skin: Negative for itching and rash.  Neurological: Positive for tingling, weakness and headaches.       Painful feet and hands  Psychiatric/Behavioral: Positive for depression, memory loss and substance abuse. Negative for suicidal ideas and hallucinations. The patient is  nervous/anxious and has insomnia.   All other systems reviewed and are negative.   Past Medical History  Diagnosis Date  . Chronic diarrhea   . History of kidney stones   . Diabetes mellitus   . Heart murmur   . Neuropathy     Hx: of  . GERD (gastroesophageal reflux disease)    Past Surgical History  Procedure Laterality Date  . Colonoscopy  02/24/2010  . Tubal ligation     Social History:  reports that she has never smoked. She does not have any smokeless tobacco history on file. She reports that she drinks about 3.0 ounces of alcohol per week. She reports that she does not use illicit drugs. Patient lives at home independently.  Allergies  Allergen Reactions  . Aspirin Palpitations    History reviewed. No pertinent family history. unknown   Prior to Admission medications   Medication Sig Start Date End Date Taking? Authorizing Provider  buPROPion (WELLBUTRIN XL) 300 MG 24 hr tablet Take 300 mg by mouth every morning.     Historical Provider, MD  chlordiazePOXIDE (LIBRIUM) 25 MG capsule Take 25 mg by mouth 4 (four) times daily -  with meals and at bedtime. *To take 30 minutes before meals and at bedtime    Historical Provider, MD  cholecalciferol (VITAMIN D) 1000 UNITS tablet Take 1,000 Units by mouth daily.    Historical Provider, MD  Cyanocobalamin (B-12) 100 MCG TABS Take 1 tablet by mouth daily.    Historical Provider, MD  esomeprazole (NEXIUM) 40 MG capsule Take 40 mg by mouth every morning.    Historical Provider, MD  fenofibrate (TRICOR) 145 MG tablet Take 145 mg by mouth every morning.     Historical Provider, MD  folic  acid (RA FOLIC ACID) Q000111Q MCG tablet Take 800 mcg by mouth daily.    Historical Provider, MD  HYDROcodone-acetaminophen (NORCO) 7.5-325 MG per tablet Take 1 tablet by mouth 3 (three) times daily as needed. For pain    Historical Provider, MD  insulin aspart (NOVOLOG FLEXPEN) 100 UNIT/ML injection Inject 5 Units into the skin 3 (three) times daily before  meals.    Historical Provider, MD  insulin glargine (LANTUS SOLOSTAR) 100 UNIT/ML injection Inject 40 Units into the skin at bedtime.    Historical Provider, MD  metoCLOPramide (REGLAN) 5 MG tablet Take 5 mg by mouth 4 (four) times daily -  with meals and at bedtime.    Historical Provider, MD  ondansetron (ZOFRAN ODT) 4 MG disintegrating tablet Take 1 tablet (4 mg total) by mouth every 8 (eight) hours as needed for nausea. 03/10/12   Gypsy Balsam. Olin Hauser, MD  ondansetron (ZOFRAN ODT) 8 MG disintegrating tablet Take 1 tablet (8 mg total) by mouth every 8 (eight) hours as needed for nausea. 02/26/12   Shaune Pollack, MD  potassium chloride SA (K-DUR,KLOR-CON) 20 MEQ tablet Take 20 mEq by mouth 2 (two) times daily.    Historical Provider, MD  potassium chloride SA (K-DUR,KLOR-CON) 20 MEQ tablet Take 1 tablet (20 mEq total) by mouth daily. 03/10/12   Gypsy Balsam. Olin Hauser, MD  rOPINIRole (REQUIP) 0.5 MG tablet Take 0.5 mg by mouth daily after supper.     Historical Provider, MD  simvastatin (ZOCOR) 40 MG tablet Take 40 mg by mouth every evening.    Historical Provider, MD   Physical Exam: Filed Vitals:   05/20/12 0200 05/20/12 0300 05/20/12 0400 05/20/12 0500  BP: 142/82 116/65 114/65 106/66  Pulse:      Temp:      TempSrc:      Resp: 16 15 20 15   Height:      Weight:    45.8 kg (100 lb 15.5 oz)  SpO2:         General:  Thin, pale and cachectic, lethargic and answers questions appropriately  Eyes: conjunctiva injected EOMI  ENT: Dry, no exudates, poor dentition   Neck: supple no adenopathy  Cardiovascular: tachycardic, loud 4/6 systolic ejection murmur  Respiratory: Clear to auscultation   Abdomen: Soft nontender nondistended bowel sounds x4   Skin: no rashes   Musculoskeletal: moves all 4 extremities, fingertips are dry with scaling and finger lubbing  Psychiatric: depressed appearing  Neurologic: nonfocal   Labs on Admission:  Basic Metabolic Panel:  Recent Labs Lab 05/19/12 1330  05/19/12 1757 05/19/12 1814 05/19/12 2249 05/20/12 0037  NA 130* 132* 130* 134* 136  K 2.7* 2.5* 2.5* 2.2* 2.0*  CL 86* 91* 87* 96 99  CO2 22  --  24 27 28   GLUCOSE 920* 628* 620* 194* 164*  BUN 9 5* 7 5* 5*  CREATININE 0.62 0.60 0.51 0.49* 0.44*  CALCIUM 10.1  --  9.8 9.0 9.0   Liver Function Tests:  Recent Labs Lab 05/19/12 1330 05/19/12 1814  AST 717* 311*  ALT 156* 106*  ALKPHOS 152* 139*  BILITOT 0.6 0.6  PROT 7.4 6.6  ALBUMIN 4.1 4.0   No results found for this basename: LIPASE, AMYLASE,  in the last 168 hours No results found for this basename: AMMONIA,  in the last 168 hours CBC:  Recent Labs Lab 05/19/12 1330 05/19/12 1745 05/19/12 1757  WBC 5.6 6.1  --   NEUTROABS  --  3.0  --  HGB 15.1* 13.8 13.9  HCT 41.8 38.4 41.0  MCV 88.4 87.3  --   PLT 126* 180  --    Cardiac Enzymes:  Recent Labs Lab 05/19/12 1814  TROPONINI <0.30    BNP (last 3 results) No results found for this basename: PROBNP,  in the last 8760 hours CBG:  Recent Labs Lab 05/19/12 2255 05/20/12 0014 05/20/12 0116 05/20/12 0250 05/20/12 0446  GLUCAP 202* 163* 163* 157* 99    Radiological Exams on Admission: Dg Chest 2 View  05/19/2012  *RADIOLOGY REPORT*  Clinical Data: Short of breath  CHEST - 2 VIEW  Comparison: 05/19/2012  Findings: Heart size is normal.  Vascularity normal.  Lungs are clear without infiltrate or effusion.  No change from prior study.  IMPRESSION: No active cardiopulmonary abnormality.   Original Report Authenticated By: Carl Best, M.D.    Dg Chest 2 View  05/19/2012  *RADIOLOGY REPORT*  Clinical Data: Teeth extractions.  CHEST - 2 VIEW  Comparison: Chest x-ray 07/08/2011.  Findings: Lung volumes are normal.  No consolidative airspace disease.  No pleural effusions.  No pneumothorax.  No pulmonary nodule or mass noted.  Pulmonary vasculature and the cardiomediastinal silhouette are within normal limits.  IMPRESSION: 1. No radiographic evidence of acute  cardiopulmonary disease.   Original Report Authenticated By: Vinnie Langton, M.D.     EKG: Independently reviewed. No signs of ischemia   Assessment/Plan  1. Hyperglycemia with DKA anion gap 19 -related to missed doses of insulin 2. Failure to thrive, weight loss-multifactorial 3. GERD 4. Hyperlipidemia -on home statin 5.  hypokalemia, dehydration from #1 6. Abnormal liver function tests- likely from EtOH abuse 7. Alcohol abuse, on Librium at home 8. Type 2 diabetes, diagnosed at the age of 54 9. Peripheral neuropathy 10. Gastroparesis    Admit to the intensive care/step down unit for glucose stabilization protocol  Close monitoring of CBGs and protocol progression including closing of the anion gap and transitioning to subcutaneous insulin  Nutrition consult  IV Protonix  Potassium replacement IV and by mouth  CIWA protocol, also scheduled Ativan low dose to prevent seizure  Reglan when necessary for nausea  Hepatitis panel, repeat LFTs   Code Status: Full code Family Communication: discussed plan of care with patient in detail  Disposition Plan: him when medically stable and followup with PCP, anticipate 2-3 days inpatient Time spent: 8 minutes  [facility] Pager [phone]  If 7PM-7AM, please contact night-coverage [email or web address] Password [facility] 05/20/2012, 5:56 AM

## 2012-05-20 NOTE — Clinical Social Work Psychosocial (Signed)
Clinical Social Work Department BRIEF PSYCHOSOCIAL ASSESSMENT 05/20/2012  Patient:  Erica [name], Erica [name]     Account Number:  [id number]     Admit date:  05/19/2012  Clinical Social Worker:  Erica [name]  Date/Time:  05/20/2012 03:00 PM  Referred by:  Physician  Date Referred:  05/20/2012 Referred for  Substance Abuse   Other Referral:   Interview type:  Patient Other interview type:   and daughterRoderic [name]    PSYCHOSOCIAL DATA Living Status:  FAMILY Admitted from facility:   Level of care:   Primary support name:  Erica [name] Primary support relationship to patient:  CHILD, ADULT Degree of support available:   supportive per pt    CURRENT CONCERNS Current Concerns  Substance Abuse   Other Concerns:    SOCIAL WORK ASSESSMENT / PLAN CSW met with pt and pt's daughter, Erica [name] at bedside with pt's permission. Pt alert and oriented and reports she lives with her daughter. She has a CAP aid for 5 hours a day Monday through Friday and 4 hours a day on Saturday and Sunday. Pt is fairly independent with ADLs. She has diabetes and was admitted with hyperglycemia. She reports she regularly checks her blood sugar at home and her daughter helps with this as well. Pt has someone with her almost around the clock, between her CAP aid, daughter, and son.    Pt addressed substance use and admits to heavily drinking for 30 years. She would drink at least a fifth of vodka daily. Pt reports with her daughters help and encouragement she was able to cut back on her own and for the past few months has only had a beer a couple times a week. She indicates she cut back slowly and it was very difficult. Pt states her parents and several siblings were alcoholics. Although she admits her drinking was a problem for her at one time, she no longer feels this way and does not think she needs any outpatient follow up.   Assessment/plan status:  No Further Intervention Required Other assessment/ plan:    Information/referral to community resources:   [place]    PATIENT'S/FAMILY'S RESPONSE TO PLAN OF CARE: Pt reports she is doing well on her own and does not need any further assistance. Alcohol levels normal on admission. CSW signing off as no further needs reported but can be reconsulted if needed.      Erica [name], [place]

## 2012-05-21 LAB — COMPREHENSIVE METABOLIC PANEL
Alkaline Phosphatase: 82 U/L (ref 39–117)
BUN: 9 mg/dL (ref 6–23)
Calcium: 8.9 mg/dL (ref 8.4–10.5)
Creatinine, Ser: 0.57 mg/dL (ref 0.50–1.10)
GFR calc Af Amer: >90 mL/min (ref >90)
Glucose, Bld: 203 mg/dL — ABNORMAL HIGH (ref 70–99)
Total Protein: 5.2 g/dL — ABNORMAL LOW (ref 6.0–8.3)

## 2012-05-21 LAB — GLUCOSE, CAPILLARY: Glucose-Capillary: 203 mg/dL — ABNORMAL HIGH (ref 70–99)

## 2012-05-21 LAB — PHOSPHORUS: Phosphorus: 2.4 mg/dL (ref 2.3–4.6)

## 2012-05-21 LAB — MAGNESIUM: Magnesium: 1.6 mg/dL (ref 1.5–2.5)

## 2012-05-21 MED ORDER — INSULIN ASPART 100 UNIT/ML ~~LOC~~ SOLN: 12.0000 [IU] | Freq: Three times a day (TID) | SUBCUTANEOUS | Status: DC

## 2012-05-21 MED ORDER — POTASSIUM CHLORIDE CRYS ER 10 MEQ PO TBCR: 20.0000 meq | EXTENDED_RELEASE_TABLET | Freq: Two times a day (BID) | ORAL | Status: DC

## 2012-05-21 MED ORDER — INSULIN GLARGINE 100 UNIT/ML ~~LOC~~ SOLN: 44.0000 [IU] | Freq: Every day | SUBCUTANEOUS | Status: DC

## 2012-05-21 NOTE — Progress Notes (Signed)
Spoke with Dr. Hilma Favors in regards to accucheck, Lantus was just given, new order given to recheck accucheck at 0200

## 2012-05-21 NOTE — Discharge Summary (Signed)
Physician Discharge Summary  Patient ID: Erica [name] MRN: [id number] DOB/AGE: 1955-04-29 57 y.o.  Admit date: 05/19/2012 Discharge date: 05/21/2012  Discharge Diagnoses:  Principal Problem:   DKA, type 2 Active Problems:   Alcohol abuse   Hypokalemia   Abnormal LFTs   Heart murmur, systolic     Medication List    STOP taking these medications       fenofibrate 145 MG tablet  Commonly known as:  TRICOR     simvastatin 40 MG tablet  Commonly known as:  ZOCOR      TAKE these medications       B-12 100 MCG Tabs  Take 1 tablet by mouth daily.     buPROPion 300 MG 24 hr tablet  Commonly known as:  WELLBUTRIN XL  Take 300 mg by mouth every morning.     esomeprazole 40 MG capsule  Commonly known as:  NEXIUM  Take 40 mg by mouth every morning.     insulin aspart 100 UNIT/ML injection  Commonly known as:  NOVOLOG FLEXPEN  Inject 12 Units into the skin 3 (three) times daily before meals.     insulin glargine 100 UNIT/ML injection  Commonly known as:  LANTUS SOLOSTAR  Inject 0.44 mLs (44 Units total) into the skin at bedtime.     oxycodone 30 MG immediate release tablet  Commonly known as:  ROXICODONE  Take 30 mg by mouth every 12 (twelve) hours as needed for pain.     potassium chloride 10 MEQ tablet  Commonly known as:  K-DUR,KLOR-CON  Take 2 tablets (20 mEq total) by mouth 2 (two) times daily.     RA FOLIC ACID Q000111Q MCG tablet  Generic drug:  folic acid  Take Q000111Q mcg by mouth daily.     rOPINIRole 1 MG tablet  Commonly known as:  REQUIP  Take 1 mg by mouth at bedtime.            Discharge Orders   Future Orders Complete By Expires     Activity as tolerated - No restrictions  As directed     Diet Carb Modified  As directed        Disposition: 01-Home or Self Care  Discharged Condition: stable  Consults:  social work  Labs:   Results for orders placed during the hospital encounter of 05/19/12 (from the past 48 hour(s))  GLUCOSE, CAPILLARY      Status: Abnormal   Collection Time    05/19/12  4:28 PM      Result Value Range   Glucose-Capillary >600 (*) 70 - 99 mg/dL  BLOOD GAS, ARTERIAL     Status: Abnormal   Collection Time    05/19/12  5:43 PM      Result Value Range   FIO2 0.21     pH, Arterial 7.410  7.350 - 7.450   pCO2 arterial 43.0  35.0 - 45.0 mmHg   pO2, Arterial 67.6 (*) 80.0 - 100.0 mmHg   Bicarbonate 26.7 (*) 20.0 - 24.0 mEq/L   TCO2 23.5  0 - 100 mmol/L   Acid-Base Excess 2.5 (*) 0.0 - 2.0 mmol/L   O2 Saturation 93.7     Patient temperature 37.0     Collection site RIGHT BRACHIAL     Drawn by COLLECTED BY RT     Sample type ARTERIAL     Allens test (pass/fail) NOT INDICATED (*) PASS  CBC WITH DIFFERENTIAL     Status: Abnormal   Collection Time  05/19/12  5:45 PM      Result Value Range   WBC 6.1  4.0 - 10.5 K/uL   RBC 4.40  3.87 - 5.11 MIL/uL   Hemoglobin 13.8  12.0 - 15.0 g/dL   HCT 38.4  36.0 - 46.0 %   MCV 87.3  78.0 - 100.0 fL   MCH 31.4  26.0 - 34.0 pg   MCHC 35.9  30.0 - 36.0 g/dL   RDW 12.0  11.5 - 15.5 %   Platelets 180  150 - 400 K/uL   Neutrophils Relative 50  43 - 77 %   Neutro Abs 3.0  1.7 - 7.7 K/uL   Lymphocytes Relative 36  12 - 46 %   Lymphs Abs 2.2  0.7 - 4.0 K/uL   Monocytes Relative 14 (*) 3 - 12 %   Monocytes Absolute 0.8  0.1 - 1.0 K/uL   Eosinophils Relative 1  0 - 5 %   Eosinophils Absolute 0.1  0.0 - 0.7 K/uL   Basophils Relative 0  0 - 1 %   Basophils Absolute 0.0  0.0 - 0.1 K/uL  URINALYSIS, ROUTINE W REFLEX MICROSCOPIC     Status: Abnormal   Collection Time    05/19/12  5:51 PM      Result Value Range   Color, Urine YELLOW  YELLOW   APPearance CLEAR  CLEAR   Specific Gravity, Urine 1.010  1.005 - 1.030   pH 5.5  5.0 - 8.0   Glucose, UA >1000 (*) NEGATIVE mg/dL   Hgb urine dipstick NEGATIVE  NEGATIVE   Bilirubin Urine NEGATIVE  NEGATIVE   Ketones, ur TRACE (*) NEGATIVE mg/dL   Protein, ur NEGATIVE  NEGATIVE mg/dL   Urobilinogen, UA 0.2  0.0 - 1.0 mg/dL    Nitrite NEGATIVE  NEGATIVE   Leukocytes, UA NEGATIVE  NEGATIVE  URINE MICROSCOPIC-ADD ON     Status: None   Collection Time    05/19/12  5:51 PM      Result Value Range   Squamous Epithelial / LPF RARE  RARE   WBC, UA 0-2  <3 WBC/hpf   Bacteria, UA RARE  RARE  POCT I-STAT, CHEM 8     Status: Abnormal   Collection Time    05/19/12  5:57 PM      Result Value Range   Sodium 132 (*) 135 - 145 mEq/L   Potassium 2.5 (*) 3.5 - 5.1 mEq/L   Chloride 91 (*) 96 - 112 mEq/L   BUN 5 (*) 6 - 23 mg/dL   Creatinine, Ser 0.60  0.50 - 1.10 mg/dL   Glucose, Bld 628 (*) 70 - 99 mg/dL   Calcium, Ion 1.19  1.12 - 1.23 mmol/L   TCO2 28  0 - 100 mmol/L   Hemoglobin 13.9  12.0 - 15.0 g/dL   HCT 41.0  36.0 - 46.0 %  COMPREHENSIVE METABOLIC PANEL     Status: Abnormal   Collection Time    05/19/12  6:14 PM      Result Value Range   Sodium 130 (*) 135 - 145 mEq/L   Potassium 2.5 (*) 3.5 - 5.1 mEq/L   Comment: DELTA CHECK NOTED   Chloride 87 (*) 96 - 112 mEq/L   CO2 24  19 - 32 mEq/L   Glucose, Bld 620 (*) 70 - 99 mg/dL   Comment: CRITICAL RESULT CALLED TO, READ BACK BY AND VERIFIED WITH:     V. RICE AT 1922 ON 05/19/12 BY S.  VANHOORNE   BUN 7  6 - 23 mg/dL   Creatinine, Ser 0.51  0.50 - 1.10 mg/dL   Calcium 9.8  8.4 - 10.5 mg/dL   Total Protein 6.6  6.0 - 8.3 g/dL   Albumin 4.0  3.5 - 5.2 g/dL   AST 311 (*) 0 - 37 U/L   ALT 106 (*) 0 - 35 U/L   Alkaline Phosphatase 139 (*) 39 - 117 U/L   Total Bilirubin 0.6  0.3 - 1.2 mg/dL   GFR calc non Af Amer >90  >90 mL/min   GFR calc Af Amer >90  >90 mL/min   Comment:            The eGFR has been calculated     using the CKD EPI equation.     This calculation has not been     validated in all clinical     situations.     eGFR's persistently     <90 mL/min signify     possible Chronic Kidney Disease.  TROPONIN I     Status: None   Collection Time    05/19/12  6:14 PM      Result Value Range   Troponin I <0.30  <0.30 ng/mL   Comment:            Due  to the release kinetics of cTnI,     a negative result within the first hours     of the onset of symptoms does not rule out     myocardial infarction with certainty.     If myocardial infarction is still suspected,     repeat the test at appropriate intervals.  ETHANOL     Status: None   Collection Time    05/19/12  8:20 PM      Result Value Range   Alcohol, Ethyl (B) <11  0 - 11 mg/dL   Comment:            LOWEST DETECTABLE LIMIT FOR     SERUM ALCOHOL IS 11 mg/dL     FOR MEDICAL PURPOSES ONLY  PROTIME-INR     Status: None   Collection Time    05/19/12  8:20 PM      Result Value Range   Prothrombin Time 12.5  11.6 - 15.2 seconds   INR 0.94  0.00 - 1.49  MRSA PCR SCREENING     Status: None   Collection Time    05/19/12  8:48 PM      Result Value Range   MRSA by PCR NEGATIVE  NEGATIVE   Comment:            The GeneXpert MRSA Assay (FDA     approved for NASAL specimens     only), is one component of a     comprehensive MRSA colonization     surveillance program. It is not     intended to diagnose MRSA     infection nor to guide or     monitor treatment for     MRSA infections.  GLUCOSE, CAPILLARY     Status: Abnormal   Collection Time    05/19/12  8:48 PM      Result Value Range   Glucose-Capillary 346 (*) 70 - 99 mg/dL   Comment 1 Documented in Chart     Comment 2 Notify RN    TSH     Status: None   Collection Time    05/19/12  9:05 PM  Result Value Range   TSH 0.425  0.350 - 4.500 uIU/mL  GLUCOSE, CAPILLARY     Status: Abnormal   Collection Time    05/19/12  9:52 PM      Result Value Range   Glucose-Capillary 257 (*) 70 - 99 mg/dL   Comment 1 Documented in Chart     Comment 2 Notify RN    BASIC METABOLIC PANEL     Status: Abnormal   Collection Time    05/19/12 10:49 PM      Result Value Range   Sodium 134 (*) 135 - 145 mEq/L   Potassium 2.2 (*) 3.5 - 5.1 mEq/L   Comment: DELTA CHECK NOTED     CRITICAL RESULT CALLED TO, READ BACK BY AND VERIFIED WITH:      BREWER D AT 2325 ON [id number] BY FORSYTH K   Chloride 96  96 - 112 mEq/L   Comment: DELTA CHECK NOTED   CO2 27  19 - 32 mEq/L   Glucose, Bld 194 (*) 70 - 99 mg/dL   BUN 5 (*) 6 - 23 mg/dL   Creatinine, Ser 0.49 (*) 0.50 - 1.10 mg/dL   Calcium 9.0  8.4 - 10.5 mg/dL   GFR calc non Af Amer >90  >90 mL/min   GFR calc Af Amer >90  >90 mL/min   Comment:            The eGFR has been calculated     using the CKD EPI equation.     This calculation has not been     validated in all clinical     situations.     eGFR's persistently     <90 mL/min signify     possible Chronic Kidney Disease.  GLUCOSE, CAPILLARY     Status: Abnormal   Collection Time    05/19/12 10:55 PM      Result Value Range   Glucose-Capillary 202 (*) 70 - 99 mg/dL   Comment 1 Documented in Chart     Comment 2 Notify RN    GLUCOSE, CAPILLARY     Status: Abnormal   Collection Time    05/20/12 12:14 AM      Result Value Range   Glucose-Capillary 163 (*) 70 - 99 mg/dL   Comment 1 Documented in Chart     Comment 2 Notify RN    BASIC METABOLIC PANEL     Status: Abnormal   Collection Time    05/20/12 12:37 AM      Result Value Range   Sodium 136  135 - 145 mEq/L   Potassium 2.0 (*) 3.5 - 5.1 mEq/L   Comment: DELTA CHECK NOTED     CRITICAL RESULT CALLED TO, READ BACK BY AND VERIFIED WITH:     BREWER D AT 0116 ON [id number] BY FORSYTH K   Chloride 99  96 - 112 mEq/L   CO2 28  19 - 32 mEq/L   Glucose, Bld 164 (*) 70 - 99 mg/dL   BUN 5 (*) 6 - 23 mg/dL   Creatinine, Ser 0.44 (*) 0.50 - 1.10 mg/dL   Calcium 9.0  8.4 - 10.5 mg/dL   GFR calc non Af Amer >90  >90 mL/min   GFR calc Af Amer >90  >90 mL/min   Comment:            The eGFR has been calculated     using the CKD EPI equation.     This calculation has not been  validated in all clinical     situations.     eGFR's persistently     <90 mL/min signify     possible Chronic Kidney Disease.  GLUCOSE, CAPILLARY     Status: Abnormal   Collection Time     05/20/12  1:16 AM      Result Value Range   Glucose-Capillary 163 (*) 70 - 99 mg/dL   Comment 1 Documented in Chart     Comment 2 Notify RN    GLUCOSE, CAPILLARY     Status: Abnormal   Collection Time    05/20/12  2:50 AM      Result Value Range   Glucose-Capillary 157 (*) 70 - 99 mg/dL   Comment 1 Documented in Chart     Comment 2 Notify RN    GLUCOSE, CAPILLARY     Status: None   Collection Time    05/20/12  4:46 AM      Result Value Range   Glucose-Capillary 99  70 - 99 mg/dL  BASIC METABOLIC PANEL     Status: Abnormal   Collection Time    05/20/12  5:11 AM      Result Value Range   Sodium 137  135 - 145 mEq/L   Potassium 3.0 (*) 3.5 - 5.1 mEq/L   Comment: DELTA CHECK NOTED   Chloride 102  96 - 112 mEq/L   CO2 25  19 - 32 mEq/L   Glucose, Bld 69 (*) 70 - 99 mg/dL   BUN 4 (*) 6 - 23 mg/dL   Creatinine, Ser 0.41 (*) 0.50 - 1.10 mg/dL   Calcium 9.1  8.4 - 10.5 mg/dL   GFR calc non Af Amer >90  >90 mL/min   GFR calc Af Amer >90  >90 mL/min   Comment:            The eGFR has been calculated     using the CKD EPI equation.     This calculation has not been     validated in all clinical     situations.     eGFR's persistently     <90 mL/min signify     possible Chronic Kidney Disease.  GLUCOSE, CAPILLARY     Status: Abnormal   Collection Time    05/20/12  5:53 AM      Result Value Range   Glucose-Capillary 103 (*) 70 - 99 mg/dL   Comment 1 Documented in Chart    GLUCOSE, CAPILLARY     Status: Abnormal   Collection Time    05/20/12  7:12 AM      Result Value Range   Glucose-Capillary 154 (*) 70 - 99 mg/dL   Comment 1 Documented in Chart    GLUCOSE, CAPILLARY     Status: Abnormal   Collection Time    05/20/12  8:04 AM      Result Value Range   Glucose-Capillary 182 (*) 70 - 99 mg/dL  GLUCOSE, CAPILLARY     Status: Abnormal   Collection Time    05/20/12 11:50 AM      Result Value Range   Glucose-Capillary 270 (*) 70 - 99 mg/dL   Comment 1 Documented in Chart      Comment 2 Notify RN    GLUCOSE, CAPILLARY     Status: Abnormal   Collection Time    05/20/12  4:34 PM      Result Value Range   Glucose-Capillary 467 (*) 70 - 99 mg/dL   Comment  1 Notify RN    GLUCOSE, CAPILLARY     Status: Abnormal   Collection Time    05/20/12  6:10 PM      Result Value Range   Glucose-Capillary 472 (*) 70 - 99 mg/dL  GLUCOSE, CAPILLARY     Status: Abnormal   Collection Time    05/20/12  6:59 PM      Result Value Range   Glucose-Capillary 460 (*) 70 - 99 mg/dL  GLUCOSE, CAPILLARY     Status: Abnormal   Collection Time    05/20/12  8:02 PM      Result Value Range   Glucose-Capillary 478 (*) 70 - 99 mg/dL   Comment 1 Notify RN    GLUCOSE, CAPILLARY     Status: Abnormal   Collection Time    05/20/12  9:12 PM      Result Value Range   Glucose-Capillary 402 (*) 70 - 99 mg/dL   Comment 1 Notify RN    GLUCOSE, CAPILLARY     Status: Abnormal   Collection Time    05/21/12  2:15 AM      Result Value Range   Glucose-Capillary 303 (*) 70 - 99 mg/dL   Comment 1 Notify RN    COMPREHENSIVE METABOLIC PANEL     Status: Abnormal   Collection Time    05/21/12  6:29 AM      Result Value Range   Sodium 137  135 - 145 mEq/L   Potassium 3.6  3.5 - 5.1 mEq/L   Chloride 105  96 - 112 mEq/L   CO2 25  19 - 32 mEq/L   Glucose, Bld 203 (*) 70 - 99 mg/dL   BUN 9  6 - 23 mg/dL   Creatinine, Ser 0.57  0.50 - 1.10 mg/dL   Calcium 8.9  8.4 - 10.5 mg/dL   Total Protein 5.2 (*) 6.0 - 8.3 g/dL   Albumin 2.8 (*) 3.5 - 5.2 g/dL   AST 42 (*) 0 - 37 U/L   ALT 47 (*) 0 - 35 U/L   Alkaline Phosphatase 82  39 - 117 U/L   Total Bilirubin 0.5  0.3 - 1.2 mg/dL   GFR calc non Af Amer >90  >90 mL/min   GFR calc Af Amer >90  >90 mL/min   Comment:            The eGFR has been calculated     using the CKD EPI equation.     This calculation has not been     validated in all clinical     situations.     eGFR's persistently     <90 mL/min signify     possible Chronic Kidney Disease.   MAGNESIUM     Status: None   Collection Time    05/21/12  6:29 AM      Result Value Range   Magnesium 1.6  1.5 - 2.5 mg/dL  PHOSPHORUS     Status: None   Collection Time    05/21/12  6:29 AM      Result Value Range   Phosphorus 2.4  2.3 - 4.6 mg/dL  GLUCOSE, CAPILLARY     Status: Abnormal   Collection Time    05/21/12  7:47 AM      Result Value Range   Glucose-Capillary 174 (*) 70 - 99 mg/dL   Comment 1 Notify RN      Diagnostics:  Dg Chest 2 View  05/19/2012  *RADIOLOGY REPORT*  Clinical  Data: Short of breath  CHEST - 2 VIEW  Comparison: 05/19/2012  Findings: Heart size is normal.  Vascularity normal.  Lungs are clear without infiltrate or effusion.  No change from prior study.  IMPRESSION: No active cardiopulmonary abnormality.   Original Report Authenticated By: Carl Best, M.D.    Dg Chest 2 View  05/19/2012  *RADIOLOGY REPORT*  Clinical Data: Teeth extractions.  CHEST - 2 VIEW  Comparison: Chest x-ray 07/08/2011.  Findings: Lung volumes are normal.  No consolidative airspace disease.  No pleural effusions.  No pneumothorax.  No pulmonary nodule or mass noted.  Pulmonary vasculature and the cardiomediastinal silhouette are within normal limits.  IMPRESSION: 1. No radiographic evidence of acute cardiopulmonary disease.   Original Report Authenticated By: Vinnie Langton, M.D.    Dg Chest Port 1 View  05/20/2012  *RADIOLOGY REPORT*  Clinical Data: Confirm line placement  PORTABLE CHEST - 1 VIEW  Comparison: Chest x-ray of 05/19/2012  Findings: A right upper extremity PICC line is present with the tip overlying the expected SVC - RA junction.  The lungs are clear and no pneumothorax is noted.  The heart is within normal limits in size.  IMPRESSION: Right PICC line tip overlies the expected SVC - RA junction.  No pneumothorax.   Original Report Authenticated By: Ivar Drape, M.D.    Procedures:  PICC  EKG: Normal sinus rhythm. Prolonged QT.  Full Code   Hospital Course: See H&P for  complete admission details. The patient is a 57 year old black female with history of alcohol abuse and insulin-dependent diabetes. She was undergoing routine blood work preoperatively prior to teeth extractions. The lab work was abnormal and she was therefore sent to the emergency room. Her blood glucose was over 900. Potassium was 2.7. Her AST was 717. ALT 156. Normal bilirubin. Alkaline phosphatase 152. She reported that she "forgot" to take her insulin. In the emergency room, she was given IV fluids, IV insulin, potassium repletion. Because of the elevated transaminases, her TriCor and statin were stopped. She was monitored and step down. She did have an elevated anion gap, despite normal bicarbonate and ABG. Apparently, she takes Librium chronically but continues to drink alcohol. She is also prescribed opiate analgesics as an outpatient. She met with social work and did not express interest in the treatment program. She reports that she has cut down on her alcohol intake but does continue to drink. By the time of discharge, her blood glucoses were in the 100s and 200s. Her potassium was repleted. She developed no signs of DTs. Her liver function tests did come down, but I recommend staying off her statin and TriCor. Her surgery has been rescheduled. She should followup with her primary care provider  Discharge Exam:  Blood pressure 126/80, pulse 88, temperature 95.6 F (35.3 C), temperature source Oral, resp. rate 20, height 5' (1.524 m), weight 45.8 kg (100 lb 15.5 oz), SpO2 98.00%.  General: Alert. Oriented. Appropriate. Lungs clear to auscultation bilaterally without wheeze rhonchi or rales Cardiovascular regular rate rhythm without murmurs gallops rubs Abdomen soft nontender nondistended Extremities no clubbing cyanosis or edema  Signed: Lexianna Weinrich L 05/21/2012, 9:11 AM

## 2012-05-21 NOTE — Progress Notes (Signed)
Patient's accucheck 478, paged Dr. Hilma Favors. Awaiting return call for further instructions

## 2012-05-21 NOTE — Progress Notes (Signed)
Accucheck rechecked - 303, paged Dr. Hilma Favors with results

## 2012-05-21 NOTE — Progress Notes (Signed)
Client is stable at this time. Client discharged to home with daughter. Reviewed discharge instructions with client and caregiver. Reviewed signs and symptoms to report to MD and/or EMS.  Understanding voiced to instructions given.

## 2012-05-23 ENCOUNTER — Ambulatory Visit (HOSPITAL_COMMUNITY): Admission: RE | Admit: 2012-05-23 | Payer: Medicaid Other | Source: Ambulatory Visit | Admitting: Oral Surgery

## 2012-05-23 ENCOUNTER — Encounter (HOSPITAL_COMMUNITY): Admission: RE | Payer: Self-pay | Source: Ambulatory Visit

## 2012-05-23 LAB — HEPATITIS PANEL, ACUTE
Hep B C IgM: NEGATIVE
Hepatitis B Surface Ag: NEGATIVE

## 2012-05-23 SURGERY — MULTIPLE EXTRACTION WITH ALVEOLOPLASTY
Anesthesia: General | Site: Mouth | Laterality: Bilateral

## 2012-05-31 ENCOUNTER — Emergency Department (HOSPITAL_COMMUNITY)
Admission: EM | Admit: 2012-05-31 | Discharge: 2012-05-31 | Disposition: A | Discharge status: Home / Self Care | Payer: Medicaid Other | Attending: Emergency Medicine | Admitting: Emergency Medicine

## 2012-05-31 ENCOUNTER — Encounter (HOSPITAL_COMMUNITY): Payer: Self-pay | Admitting: *Deleted

## 2012-05-31 DIAGNOSIS — Z87442 Personal history of urinary calculi: Secondary | ICD-10-CM | POA: Insufficient documentation

## 2012-05-31 DIAGNOSIS — K529 Noninfective gastroenteritis and colitis, unspecified: Secondary | ICD-10-CM

## 2012-05-31 DIAGNOSIS — R011 Cardiac murmur, unspecified: Secondary | ICD-10-CM | POA: Insufficient documentation

## 2012-05-31 DIAGNOSIS — K219 Gastro-esophageal reflux disease without esophagitis: Secondary | ICD-10-CM | POA: Insufficient documentation

## 2012-05-31 DIAGNOSIS — E162 Hypoglycemia, unspecified: Secondary | ICD-10-CM

## 2012-05-31 DIAGNOSIS — Z79899 Other long term (current) drug therapy: Secondary | ICD-10-CM | POA: Insufficient documentation

## 2012-05-31 DIAGNOSIS — R197 Diarrhea, unspecified: Secondary | ICD-10-CM | POA: Insufficient documentation

## 2012-05-31 DIAGNOSIS — F1010 Alcohol abuse, uncomplicated: Secondary | ICD-10-CM | POA: Insufficient documentation

## 2012-05-31 DIAGNOSIS — Z8669 Personal history of other diseases of the nervous system and sense organs: Secondary | ICD-10-CM | POA: Insufficient documentation

## 2012-05-31 DIAGNOSIS — E1169 Type 2 diabetes mellitus with other specified complication: Secondary | ICD-10-CM | POA: Insufficient documentation

## 2012-05-31 DIAGNOSIS — Z794 Long term (current) use of insulin: Secondary | ICD-10-CM | POA: Insufficient documentation

## 2012-05-31 LAB — CBC WITH DIFFERENTIAL/PLATELET
Basophils Absolute: 0 10*3/uL (ref 0.0–0.1)
Eosinophils Relative: 2 % (ref 0–5)
HCT: 40 % (ref 36.0–46.0)
Hemoglobin: 13.4 g/dL (ref 12.0–15.0)
Lymphocytes Relative: 28 % (ref 12–46)
MCV: 93.7 fL (ref 78.0–100.0)
Monocytes Absolute: 0.9 10*3/uL (ref 0.1–1.0)
Monocytes Relative: 12 % (ref 3–12)
RDW: 12.2 % (ref 11.5–15.5)
WBC: 7.5 10*3/uL (ref 4.0–10.5)

## 2012-05-31 LAB — BASIC METABOLIC PANEL
BUN: 9 mg/dL (ref 6–23)
CO2: 22 mEq/L (ref 19–32)
Calcium: 8.9 mg/dL (ref 8.4–10.5)
Creatinine, Ser: 0.54 mg/dL (ref 0.50–1.10)
Glucose, Bld: 93 mg/dL (ref 70–99)

## 2012-05-31 LAB — GLUCOSE, CAPILLARY: Glucose-Capillary: 70 mg/dL (ref 70–99)

## 2012-05-31 LAB — TROPONIN I: Troponin I: <0.3 ng/mL (ref <0.30)

## 2012-05-31 MED ORDER — SODIUM CHLORIDE 0.9 % IV SOLN: Freq: Once | INTRAVENOUS | Status: DC

## 2012-05-31 MED ORDER — SODIUM CHLORIDE 0.9 % IV BOLUS (SEPSIS)
1000.0000 mL | Freq: Once | INTRAVENOUS | Status: AC
  Administered 2012-05-31: 1000 mL via INTRAVENOUS

## 2012-05-31 MED ORDER — THIAMINE HCL 100 MG/ML IJ SOLN
100.0000 mg | Freq: Every day | INTRAMUSCULAR | Status: DC
  Administered 2012-05-31: 100 mg via INTRAVENOUS
  Filled 2012-05-31: qty 2

## 2012-05-31 NOTE — ED Provider Notes (Signed)
History     CSN: [id number]  Arrival date & time 05/31/12  1514   First MD Initiated Contact with Patient 05/31/12 1522      Chief Complaint  Patient presents with  . Hypoglycemia    (Consider location/radiation/quality/duration/timing/severity/associated sxs/prior treatment) HPI Comments: 57 year old black female with history of alcohol abuse and insulin-dependent diabetes comes in with cc of hypoglycemia. Pt has been taking her insulin as prescribed, but admits to decreased appetite x 2 weeks and also chronic diarrhea. She denies any chest pain, dib, fevers, chills, nausea, emesis, uti like sx. Pt does admit to heavy drinking, however, she has been drinking less over the past few days. Pt had a recent DKA admission, no changes to her meds at that time.  The history is provided by the patient.    Past Medical History  Diagnosis Date  . Chronic diarrhea   . History of kidney stones   . Diabetes mellitus   . Heart murmur   . Neuropathy     Hx: of  . GERD (gastroesophageal reflux disease)     Past Surgical History  Procedure Laterality Date  . Colonoscopy  02/24/2010  . Tubal ligation      No family history on file.  History  Substance Use Topics  . Smoking status: Never Smoker   . Smokeless tobacco: Not on file  . Alcohol Use: 3.0 oz/week    6 drink(s) per week     Comment: weekly    OB History   Grav Para Term Preterm Abortions TAB SAB Ect Mult Living                  Review of Systems  Constitutional: Positive for activity change. Negative for fatigue.  HENT: Negative for facial swelling and neck pain.   Respiratory: Negative for cough, shortness of breath and wheezing.   Cardiovascular: Negative for chest pain.  Gastrointestinal: Positive for diarrhea. Negative for nausea, vomiting, abdominal pain, constipation, blood in stool and abdominal distention.  Genitourinary: Negative for dysuria, hematuria and difficulty urinating.  Skin: Negative for color  change.  Neurological: Negative for speech difficulty and headaches.  Hematological: Does not bruise/bleed easily.  Psychiatric/Behavioral: Negative for confusion.    Allergies  Aspirin  Home Medications   Current Outpatient Rx  Name  Route  Sig  Dispense  Refill  . buPROPion (WELLBUTRIN XL) 300 MG 24 hr tablet   Oral   Take 300 mg by mouth every morning.          . Cyanocobalamin (B-12) 100 MCG TABS   Oral   Take 1 tablet by mouth daily.         Marland Kitchen esomeprazole (NEXIUM) 40 MG capsule   Oral   Take 40 mg by mouth every morning.         . folic acid (RA FOLIC ACID) Q000111Q MCG tablet   Oral   Take 800 mcg by mouth daily.         . insulin aspart (NOVOLOG FLEXPEN) 100 UNIT/ML injection   Subcutaneous   Inject 12 Units into the skin 3 (three) times daily before meals.   1 vial      . insulin glargine (LANTUS SOLOSTAR) 100 UNIT/ML injection   Subcutaneous   Inject 0.44 mLs (44 Units total) into the skin at bedtime.   10 mL      . oxycodone (ROXICODONE) 30 MG immediate release tablet   Oral   Take 30 mg by mouth every 12 (twelve)  hours as needed for pain.           BP 142/84  Pulse 81  Temp(Src) 97.5 F (36.4 C) (Oral)  Resp 16  SpO2 100%  Physical Exam  Nursing note and vitals reviewed. Constitutional: She is oriented to person, place, and time. She appears well-developed and well-nourished.  HENT:  Head: Normocephalic and atraumatic.  Eyes: EOM are normal. Pupils are equal, round, and reactive to light.  Neck: Neck supple.  Cardiovascular: Normal rate, regular rhythm and normal heart sounds.   No murmur heard. Pulmonary/Chest: Effort normal. No respiratory distress.  Abdominal: Soft. She exhibits no distension. There is no tenderness. There is no rebound and no guarding.  Neurological: She is alert and oriented to person, place, and time.  Skin: Skin is warm and dry.    ED Course  Procedures (including critical care time)  Labs Reviewed   GLUCOSE, CAPILLARY  CBC WITH DIFFERENTIAL  BASIC METABOLIC PANEL  TROPONIN I   No results found.   No diagnosis found.    MDM   Date: 05/31/2012  Rate: 85  Rhythm: normal sinus rhythm  QRS Axis: normal  Intervals: normal  ST/T Wave abnormalities: normal  Conduction Disutrbances: none  Narrative Interpretation: unremarkable  Pt comes in with cc of hypoglycemia. Pt is aox3 for me. She is iddm, and admits to alcoholism and decreased po intake with diarrhea. Low glucose likely multifactorial in the patient's case. Will check CBG x 2 - and start oral feeds. Pt has no objective findings of infection and hx not suggestive of that either.      Varney Biles, MD 05/31/12 [phone]

## 2012-05-31 NOTE — ED Notes (Signed)
Per EMS pt was in the car with her daughter when daughter noticed pt was not "acting right" Daughter gave pt soda and called EMS. Per EMS initial CBG was 54, after giving oral glucose 59.

## 2012-05-31 NOTE — ED Notes (Signed)
PIV start attempted, no success, 2nd RN assessed; IV team paged

## 2012-05-31 NOTE — ED Notes (Signed)
[id number] Expected date:<BR> Expected time:<BR> Means of arrival:<BR> Comments:<BR> Hypoglycemic 48

## 2012-06-09 ENCOUNTER — Encounter (HOSPITAL_COMMUNITY)
Admission: RE | Admit: 2012-06-09 | Discharge: 2012-06-09 | Disposition: A | Discharge status: Home / Self Care | Payer: Medicaid Other | Source: Ambulatory Visit | Attending: Oral Surgery | Admitting: Oral Surgery

## 2012-06-09 ENCOUNTER — Encounter (HOSPITAL_COMMUNITY): Payer: Self-pay

## 2012-06-09 NOTE — Pre-Procedure Instructions (Signed)
ADRYANA HEIMBIGNER  06/09/2012   Your procedure is scheduled on:  Monday, April 28th  Report to [facility] [place].  Call this number if you have problems the morning of surgery: [phone]   Remember:   Do not eat food or drink liquids after midnight.    Take these medicines the morning of surgery with A SIP OF WATER: Wellbutrin, Nexium, oxycodone if needed   Do not wear jewelry, make-up or nail polish.  Do not wear lotions, powders, or perfume, deodorant.  Do not shave 48 hours prior to surgery  Do not bring valuables to the hospital.  Contacts, dentures or bridgework may not be worn into surgery.  Leave suitcase in the car. After surgery it may be brought to your room.  For patients admitted to the hospital, checkout time is 11:00 AM the day of discharge.   Patients discharged the day of surgery will not be allowed to drive home.    Special Instructions: Shower using CHG 2 nights before surgery and the night before surgery.  If you shower the day of surgery use CHG.  Use special wash - you have one bottle of CHG for all showers.  You should use approximately 1/3 of the bottle for each shower.   Please read over the following fact sheets that you were given: Pain Booklet, Coughing and Deep Breathing, MRSA Information and Surgical Site Infection Prevention

## 2012-06-09 NOTE — H&P (Signed)
HISTORY AND PHYSICAL  Erica [name] is a 57 y.o. female patient with CC: Painful teeth.  No diagnosis found.  Past Medical History  Diagnosis Date  . Chronic diarrhea   . History of kidney stones   . Heart murmur   . Neuropathy     Hx: of  . GERD (gastroesophageal reflux disease)   . Diabetes mellitus     fasting blood sugar 110-120s    No current facility-administered medications for this encounter.   Current Outpatient Prescriptions  Medication Sig Dispense Refill  . buPROPion (WELLBUTRIN XL) 300 MG 24 hr tablet Take 300 mg by mouth every morning.       . chlordiazePOXIDE (LIBRIUM) 25 MG capsule Take 25 mg by mouth 4 (four) times daily -  before meals and at bedtime.      . cholecalciferol (VITAMIN D) 1000 UNITS tablet Take 1,000 Units by mouth every morning.      . Cyanocobalamin (B-12) 100 MCG TABS Take 1 tablet by mouth every morning.       Marland Kitchen esomeprazole (NEXIUM) 40 MG capsule Take 40 mg by mouth 2 (two) times daily.       . fenofibrate (TRICOR) 145 MG tablet Take 145 mg by mouth every morning.      . folic acid (RA FOLIC ACID) Q000111Q MCG tablet Take 800 mcg by mouth every morning.       . insulin aspart (NOVOLOG) 100 UNIT/ML injection Inject 14 Units into the skin 3 (three) times daily before meals.      . insulin glargine (LANTUS) 100 UNIT/ML injection Inject 44 Units into the skin at bedtime.      Marland Kitchen oxycodone (ROXICODONE) 30 MG immediate release tablet Take 30 mg by mouth every 12 (twelve) hours as needed for pain.      . potassium chloride SA (K-DUR,KLOR-CON) 20 MEQ tablet Take 20 mEq by mouth 2 (two) times daily.      Marland Kitchen rOPINIRole (REQUIP) 0.5 MG tablet Take 0.5 mg by mouth every evening.      . simvastatin (ZOCOR) 40 MG tablet Take 40 mg by mouth every evening.       Allergies  Allergen Reactions  . Aspirin Palpitations   Active Problems:   * No active hospital problems. *  Vitals: There were no vitals taken for this visit. Lab results:No results found for  this or any previous visit (from the past 74 hour(s)). Radiology Results: No results found. General appearance: alert, cooperative and cachectic Head: Normocephalic, without obvious abnormality, atraumatic Eyes: negative Ears: normal TM's and external ear canals both ears Nose: Nares normal. Septum midline. Mucosa normal. No drainage or sinus tenderness. Throat: lips, mucosa, and tongue normal; teeth and gums normal and Dental caries teeth #'s 18, 19, 20, 21, 22, 24, 25, 27, 28, 29, 30, 31. Edentulous maxilla Neck: no adenopathy, supple, symmetrical, trachea midline and thyroid not enlarged, symmetric, no tenderness/mass/nodules Resp: clear to auscultation bilaterally Cardio: regular rate and rhythm, S1, S2 normal, no murmur, click, rub or gallop  Assessment: 22 Female NIDDM, GERD, Kidney Stones with non-restorableteeth #'s 18, 19, 20, 21, 22, 24, 25, 27, 28, 29, 30, 31.    Plan:Extraction teeth #'s 18, 19, 20, 21, 22, 24, 25, 27, 28, 29, 30, 31 with alveoloplasty. General anesthsia. Day surgery.       Gae Bon 06/09/2012

## 2012-06-12 MED ORDER — CEFAZOLIN SODIUM-DEXTROSE 2-3 GM-% IV SOLR
2.0000 g | INTRAVENOUS | Status: AC
  Filled 2012-06-12: qty 50

## 2012-06-13 ENCOUNTER — Encounter (HOSPITAL_COMMUNITY): Payer: Self-pay | Admitting: Anesthesiology

## 2012-06-13 ENCOUNTER — Encounter (HOSPITAL_COMMUNITY)
Admission: RE | Disposition: A | Discharge status: Home / Self Care | Payer: Self-pay | Source: Ambulatory Visit | Attending: Oral Surgery

## 2012-06-13 ENCOUNTER — Ambulatory Visit (HOSPITAL_COMMUNITY): Payer: Medicaid Other | Admitting: Anesthesiology

## 2012-06-13 ENCOUNTER — Encounter (HOSPITAL_COMMUNITY): Payer: Self-pay | Admitting: *Deleted

## 2012-06-13 ENCOUNTER — Ambulatory Visit (HOSPITAL_COMMUNITY)
Admission: RE | Admit: 2012-06-13 | Discharge: 2012-06-13 | Disposition: A | Discharge status: Home / Self Care | Payer: Medicaid Other | Source: Ambulatory Visit | Attending: Oral Surgery | Admitting: Oral Surgery

## 2012-06-13 DIAGNOSIS — F10929 Alcohol use, unspecified with intoxication, unspecified: Secondary | ICD-10-CM

## 2012-06-13 DIAGNOSIS — R7989 Other specified abnormal findings of blood chemistry: Secondary | ICD-10-CM

## 2012-06-13 DIAGNOSIS — R011 Cardiac murmur, unspecified: Secondary | ICD-10-CM

## 2012-06-13 DIAGNOSIS — E162 Hypoglycemia, unspecified: Secondary | ICD-10-CM

## 2012-06-13 DIAGNOSIS — Z79899 Other long term (current) drug therapy: Secondary | ICD-10-CM | POA: Insufficient documentation

## 2012-06-13 DIAGNOSIS — Z8719 Personal history of other diseases of the digestive system: Secondary | ICD-10-CM

## 2012-06-13 DIAGNOSIS — Z794 Long term (current) use of insulin: Secondary | ICD-10-CM | POA: Insufficient documentation

## 2012-06-13 DIAGNOSIS — K029 Dental caries, unspecified: Secondary | ICD-10-CM

## 2012-06-13 DIAGNOSIS — R945 Abnormal results of liver function studies: Secondary | ICD-10-CM

## 2012-06-13 DIAGNOSIS — E1110 Type 2 diabetes mellitus with ketoacidosis without coma: Secondary | ICD-10-CM

## 2012-06-13 DIAGNOSIS — R197 Diarrhea, unspecified: Secondary | ICD-10-CM | POA: Insufficient documentation

## 2012-06-13 DIAGNOSIS — R112 Nausea with vomiting, unspecified: Secondary | ICD-10-CM

## 2012-06-13 DIAGNOSIS — K219 Gastro-esophageal reflux disease without esophagitis: Secondary | ICD-10-CM | POA: Insufficient documentation

## 2012-06-13 DIAGNOSIS — G9341 Metabolic encephalopathy: Secondary | ICD-10-CM

## 2012-06-13 DIAGNOSIS — E876 Hypokalemia: Secondary | ICD-10-CM

## 2012-06-13 DIAGNOSIS — F1010 Alcohol abuse, uncomplicated: Secondary | ICD-10-CM

## 2012-06-13 DIAGNOSIS — E119 Type 2 diabetes mellitus without complications: Secondary | ICD-10-CM | POA: Insufficient documentation

## 2012-06-13 DIAGNOSIS — K0530 Chronic periodontitis, unspecified: Secondary | ICD-10-CM

## 2012-06-13 DIAGNOSIS — G609 Hereditary and idiopathic neuropathy, unspecified: Secondary | ICD-10-CM | POA: Insufficient documentation

## 2012-06-13 HISTORY — PX: MULTIPLE EXTRACTIONS WITH ALVEOLOPLASTY: SHX5342

## 2012-06-13 LAB — GLUCOSE, CAPILLARY
Glucose-Capillary: 38 mg/dL — CL (ref 70–99)
Glucose-Capillary: 42 mg/dL — CL (ref 70–99)
Glucose-Capillary: 57 mg/dL — ABNORMAL LOW (ref 70–99)
Glucose-Capillary: 89 mg/dL (ref 70–99)

## 2012-06-13 SURGERY — MULTIPLE EXTRACTION WITH ALVEOLOPLASTY
Anesthesia: General | Site: Mouth | Wound class: Clean Contaminated

## 2012-06-13 MED ORDER — NEOSTIGMINE METHYLSULFATE 1 MG/ML IJ SOLN
INTRAMUSCULAR | Status: DC | PRN
  Administered 2012-06-13: 3 mg via INTRAVENOUS

## 2012-06-13 MED ORDER — LIDOCAINE-EPINEPHRINE 2 %-1:100000 IJ SOLN
INTRAMUSCULAR | Status: AC
  Filled 2012-06-13: qty 1

## 2012-06-13 MED ORDER — AMOXICILLIN 500 MG PO CAPS: 500.0000 mg | ORAL_CAPSULE | Freq: Four times a day (QID) | ORAL | Status: DC

## 2012-06-13 MED ORDER — DEXTROSE 50 % IV SOLN: 12.5000 g | Freq: Once | INTRAVENOUS | Status: AC

## 2012-06-13 MED ORDER — DEXTROSE 50 % IV SOLN
12.5000 g | Freq: Once | INTRAVENOUS | Status: AC
  Administered 2012-06-13: 12.5 g via INTRAVENOUS
  Administered 2012-06-13: 25 g via INTRAVENOUS
  Filled 2012-06-13: qty 50

## 2012-06-13 MED ORDER — HYDROMORPHONE HCL PF 1 MG/ML IJ SOLN
INTRAMUSCULAR | Status: AC
  Filled 2012-06-13: qty 1

## 2012-06-13 MED ORDER — SODIUM CHLORIDE 0.9 % IR SOLN
Status: DC | PRN
  Administered 2012-06-13: 1

## 2012-06-13 MED ORDER — GLYCOPYRROLATE 0.2 MG/ML IJ SOLN
INTRAMUSCULAR | Status: DC | PRN
Start: 2012-06-13 — End: 2012-06-13
  Administered 2012-06-13: .4 mg via INTRAVENOUS

## 2012-06-13 MED ORDER — CEFAZOLIN SODIUM 1-5 GM-% IV SOLN
INTRAVENOUS | Status: DC | PRN
  Administered 2012-06-13: 2 g via INTRAVENOUS

## 2012-06-13 MED ORDER — ONDANSETRON HCL 4 MG/2ML IJ SOLN: 4.0000 mg | Freq: Once | INTRAMUSCULAR | Status: AC | PRN

## 2012-06-13 MED ORDER — LIDOCAINE-EPINEPHRINE 2 %-1:100000 IJ SOLN
INTRAMUSCULAR | Status: DC | PRN
  Administered 2012-06-13: 20 mL

## 2012-06-13 MED ORDER — ROCURONIUM BROMIDE 100 MG/10ML IV SOLN
INTRAVENOUS | Status: DC | PRN
  Administered 2012-06-13: 20 mg via INTRAVENOUS

## 2012-06-13 MED ORDER — LIDOCAINE HCL 4 % MT SOLN
OROMUCOSAL | Status: DC | PRN
  Administered 2012-06-13: 4 mL via TOPICAL

## 2012-06-13 MED ORDER — HYDROMORPHONE HCL PF 1 MG/ML IJ SOLN
0.2500 mg | INTRAMUSCULAR | Status: DC | PRN
  Administered 2012-06-13 (×4): 0.25 mg via INTRAVENOUS

## 2012-06-13 MED ORDER — PROPOFOL 10 MG/ML IV BOLUS
INTRAVENOUS | Status: DC | PRN
  Administered 2012-06-13: 150 mg via INTRAVENOUS

## 2012-06-13 MED ORDER — DEXTROSE 50 % IV SOLN
INTRAVENOUS | Status: AC
  Administered 2012-06-13: 12.5 g via INTRAVENOUS
  Filled 2012-06-13: qty 50

## 2012-06-13 MED ORDER — SUCCINYLCHOLINE CHLORIDE 20 MG/ML IJ SOLN
INTRAMUSCULAR | Status: DC | PRN
  Administered 2012-06-13: 100 mg via INTRAVENOUS

## 2012-06-13 MED ORDER — OXYMETAZOLINE HCL 0.05 % NA SOLN
NASAL | Status: AC
  Filled 2012-06-13: qty 15

## 2012-06-13 MED ORDER — OXYCODONE-ACETAMINOPHEN 5-325 MG PO TABS: 1.0000 | ORAL_TABLET | ORAL | Status: DC | PRN

## 2012-06-13 MED ORDER — ONDANSETRON HCL 4 MG/2ML IJ SOLN
INTRAMUSCULAR | Status: DC | PRN
  Administered 2012-06-13: 4 mg via INTRAVENOUS

## 2012-06-13 MED ORDER — LIDOCAINE HCL (CARDIAC) 20 MG/ML IV SOLN
INTRAVENOUS | Status: DC | PRN
  Administered 2012-06-13: 50 mg via INTRAVENOUS

## 2012-06-13 MED ORDER — LACTATED RINGERS IV SOLN
INTRAVENOUS | Status: DC | PRN
  Administered 2012-06-13: 08:00:00 via INTRAVENOUS

## 2012-06-13 SURGICAL SUPPLY — 27 items
BUR CROSS CUT FISSURE 1.6 (BURR) ×2 IMPLANT
BUR EGG ELITE 4.0 (BURR) ×1 IMPLANT
CANISTER SUCTION 2500CC (MISCELLANEOUS) ×2 IMPLANT
CLOTH BEACON ORANGE TIMEOUT ST (SAFETY) ×2 IMPLANT
COVER SURGICAL LIGHT HANDLE (MISCELLANEOUS) ×2 IMPLANT
CRADLE DONUT ADULT HEAD (MISCELLANEOUS) ×2 IMPLANT
DECANTER SPIKE VIAL GLASS SM (MISCELLANEOUS) ×2 IMPLANT
GAUZE PACKING FOLDED 2  STR (GAUZE/BANDAGES/DRESSINGS) ×1
GAUZE PACKING FOLDED 2 STR (GAUZE/BANDAGES/DRESSINGS) ×1 IMPLANT
GLOVE BIO SURGEON STRL SZ 6.5 (GLOVE) ×2 IMPLANT
GLOVE BIO SURGEON STRL SZ7.5 (GLOVE) ×2 IMPLANT
GLOVE BIOGEL PI IND STRL 7.0 (GLOVE) ×1 IMPLANT
GLOVE BIOGEL PI INDICATOR 7.0 (GLOVE) ×1
GOWN STRL NON-REIN LRG LVL3 (GOWN DISPOSABLE) ×2 IMPLANT
GOWN STRL REIN XL XLG (GOWN DISPOSABLE) ×2 IMPLANT
KIT BASIN OR (CUSTOM PROCEDURE TRAY) ×2 IMPLANT
KIT ROOM TURNOVER OR (KITS) ×2 IMPLANT
NEEDLE 22X1 1/2 (OR ONLY) (NEEDLE) ×2 IMPLANT
NS IRRIG 1000ML POUR BTL (IV SOLUTION) ×2 IMPLANT
PAD ARMBOARD 7.5X6 YLW CONV (MISCELLANEOUS) ×4 IMPLANT
SUT CHROMIC 3 0 PS 2 (SUTURE) ×3 IMPLANT
SYR CONTROL 10ML LL (SYRINGE) ×2 IMPLANT
TOWEL OR 17X26 10 PK STRL BLUE (TOWEL DISPOSABLE) ×2 IMPLANT
TRAY ENT MC OR (CUSTOM PROCEDURE TRAY) ×2 IMPLANT
TUBING IRRIGATION (MISCELLANEOUS) ×1 IMPLANT
WATER STERILE IRR 1000ML POUR (IV SOLUTION) IMPLANT
YANKAUER SUCT BULB TIP NO VENT (SUCTIONS) ×2 IMPLANT

## 2012-06-13 NOTE — Anesthesia Preprocedure Evaluation (Addendum)
Anesthesia Evaluation  Patient identified by MRN, date of birth, ID band Patient awake    Reviewed: Allergy & Precautions, H&P , NPO status , Patient's Chart, lab work & pertinent test results  Airway       Dental   Pulmonary          Cardiovascular     Neuro/Psych    GI/Hepatic GERD-  ,  Endo/Other  diabetes, Type 2, Insulin Dependent  Renal/GU      Musculoskeletal   Abdominal   Peds  Hematology   Anesthesia Other Findings ETOH abuse  Reproductive/Obstetrics                           Anesthesia Physical Anesthesia Plan  ASA: II  Anesthesia Plan:    Post-op Pain Management:    Induction:   Airway Management Planned:   Additional Equipment:   Intra-op Plan:   Post-operative Plan:   Informed Consent:   Plan Discussed with:   Anesthesia Plan Comments:         Anesthesia Quick Evaluation

## 2012-06-13 NOTE — Transfer of Care (Signed)
Immediate Anesthesia Transfer of Care Note  Patient: Erica [name]  Procedure(s) Performed: Procedure(s): MULTIPLE EXTRACION WITH ALVEOLOPLASTY EXTRACT: 18, 19, 20, 21, 22, 24, 25, 27, 28, 29, 30, 31 (N/A)  Patient Location: PACU  Anesthesia Type:General  Level of Consciousness: awake, alert  and oriented  Airway & Oxygen Therapy: Patient Spontanous Breathing and Patient connected to nasal cannula oxygen  Post-op Assessment: Report given to PACU RN and Post -op Vital signs reviewed and stable  Post vital signs: Reviewed and stable  Complications: No apparent anesthesia complications

## 2012-06-13 NOTE — Preoperative (Signed)
Beta Blockers   Reason not to administer Beta Blockers:Not Applicable 

## 2012-06-13 NOTE — H&P (Signed)
H&P documentation  -History and Physical Reviewed  -Patient has been re-examined  -No change in the plan of care  Erica [name]  

## 2012-06-13 NOTE — Progress Notes (Signed)
Notified Dr. Tamala Julian of patient's blood sugar of 38 at [place]. Pt. Alert and oriented. Stated she felt nauseated and shaky. New orders received.

## 2012-06-13 NOTE — Op Note (Signed)
06/13/2012  9:26 AM  PATIENT:  Erica [name]  57 y.o. female  PRE-OPERATIVE DIAGNOSIS:  NON RESTORABLE TEETH 18, 19, 20, 21, 22, 24, 25, 27, 28, 29, 30, 31  POST-OPERATIVE DIAGNOSIS:  SAME  PROCEDURE:  Procedure(s): MULTIPLE EXTRACION WITH ALVEOLOPLASTY EXTRACT: 18, 19, 20, 21, 22, 24, 25, 27, 28, 29, 30, 31  SURGEON:  Surgeon(s): Gae Bon, DDS  ANESTHESIA:   local and general  EBL:  minimal  DRAINS: none   SPECIMEN:  No Specimen  COUNTS:  YES  PLAN OF CARE: Discharge to home after PACU  PATIENT DISPOSITION:  PACU - hemodynamically stable.   PROCEDURE DETAILS: Dictation # [id number]  Gae Bon, DMD 06/13/2012 9:26 AM

## 2012-06-13 NOTE — Progress Notes (Signed)
Pt voided without difficulty.

## 2012-06-13 NOTE — Anesthesia Postprocedure Evaluation (Signed)
  Anesthesia Post-op Note  Patient: Erica [name]  Procedure(s) Performed: Procedure(s): MULTIPLE EXTRACION WITH ALVEOLOPLASTY EXTRACT: 18, 19, 20, 21, 22, 24, 25, 27, 28, 29, 30, 31 (N/A)  Patient Location: PACU  Anesthesia Type:General  Level of Consciousness: awake, oriented and patient cooperative  Airway and Oxygen Therapy: Patient Spontanous Breathing  Post-op Pain: mild  Post-op Assessment: Post-op Vital signs reviewed, Patient's Cardiovascular Status Stable, Respiratory Function Stable, Patent Airway, No signs of Nausea or vomiting and Pain level controlled  Post-op Vital Signs: stable  Complications: No apparent anesthesia complications

## 2012-06-14 ENCOUNTER — Encounter (HOSPITAL_COMMUNITY): Payer: Self-pay | Admitting: Oral Surgery

## 2012-06-14 NOTE — Op Note (Signed)
NAMEMURIAL, BEDROSIAN            ACCOUNT NO.:  [id number]  MEDICAL RECORD NO.:  [id number]  LOCATION:  [facility]                         FACILITY:  [facility]  PHYSICIAN:  Gae Bon, M.D.  DATE OF BIRTH:  1955/11/10  DATE OF PROCEDURE:  06/13/2012 DATE OF DISCHARGE:                              OPERATIVE REPORT   PREOPERATIVE DIAGNOSIS:  Nonrestorable teeth numbers 18, 19, 20, 21, 22, 24, 25, 27, 28, 29, 30, 31.  POSTOPERATIVE DIAGNOSIS:  Nonrestorable teeth numbers 18, 19, 20, 21, 22, 24, 25, 27, 28, 29, 30, 31.  PROCEDURE:  Extraction teeth numbers 18, 19, 20, 21, 22, 24, 25, 27, 28, 29, 30, 31.  Alveoplasty of right and left mandible.  SURGEON:  Gae Bon, M.D.  ANESTHESIA:  General, Dr. Tamala Julian attending.  Nasal intubation.  INDICATIONS FOR PROCEDURE:  Laylanie is a 57 year old female, who is referred to me by her general dentist for removal of multiple nonrestorable teeth.  She is edentulous in the maxilla and had severe dental caries in the lower arch.  She has a history of insulin-dependent diabetes, and is an oral snuff user.  Because of the extensiveness of the procedure it was recommended that general anesthesia be used for the procedure.  The patient was scheduled for outpatient surgery for intubation for airway protection.  PROCEDURE IN DETAIL:  The patient was taken to the operating room, placed on the table in supine position.  General anesthesia was administered intravenously and a nasal endotracheal tube was placed and secured.  The eyes were protected.  The patient was draped for surgery. A time-out was performed.  The posterior pharynx was then suctioned with Yankauer suction and throat pack was placed.  Then, 2% lidocaine 1:100,000 epinephrine was infiltrated in an inferior alveolar block on the right and left side, a total of 12 mL was utilized.  The bite block was placed in the right side of the mouth and a sweetheart retractor was used to retract the  tongue.  A 15 blade was used to make a full- thickness incision beginning at tooth #18 carrying anteriorly to tooth #25 on the buccal and lingual surfaces.  The periosteum was then reflected with a periosteal elevator.  Then, a Stryker handpiece using self irrigation was used to remove interproximal bone around the posterior teeth.  The teeth were elevated with a 301 elevator.  The posterior teeth were removed with the #17 dental forceps and the anterior teeth were removed with the Asch forceps.  The sockets were then curetted.  The gingival tissue was trimmed and alveoplasty was performed with the egg-shaped bur and bone file.  The area was sutured with 3-0 chromic.  The sweetheart retractor and bite block were placed on either side of the mouth and a 15 blade was used to make a full- thickness incision around teeth numbers 27, 28, 29, 30, 31 on the buccal and lingual surfaces.  Then, the periosteum was reflected, interproximal bone was removed.  The teeth were elevated with a 301 elevator and removed with the dental forceps.  The sockets were then curetted.  The gingival tissue was trimmed with a 15 blade and rongeurs, and then the alveoplasty was performed with  the egg-shaped bur and a bone file.  The area was then irrigated and closed with 3-0 chromic.  The oral cavity was then inspected and found to have good closure and contour.  The oral cavity was irrigated and suctioned.  Throat pack was removed.  The patient was awakened taken to the recovery room, breathing spontaneously in good condition.  EBL minimum.  Complications none. Specimens none.     Gae Bon, M.D.     SMJ/MEDQ  D:  06/13/2012  T:  06/14/2012  Job:  [id number]

## 2012-07-03 ENCOUNTER — Encounter (HOSPITAL_COMMUNITY): Payer: Self-pay | Admitting: *Deleted

## 2012-07-03 ENCOUNTER — Emergency Department (HOSPITAL_COMMUNITY): Payer: Medicaid Other

## 2012-07-03 ENCOUNTER — Emergency Department (HOSPITAL_COMMUNITY)
Admission: EM | Admit: 2012-07-03 | Discharge: 2012-07-03 | Disposition: A | Discharge status: Home / Self Care | Payer: Medicaid Other | Attending: Emergency Medicine | Admitting: Emergency Medicine

## 2012-07-03 DIAGNOSIS — Y9389 Activity, other specified: Secondary | ICD-10-CM | POA: Insufficient documentation

## 2012-07-03 DIAGNOSIS — K219 Gastro-esophageal reflux disease without esophagitis: Secondary | ICD-10-CM | POA: Insufficient documentation

## 2012-07-03 DIAGNOSIS — S82142A Displaced bicondylar fracture of left tibia, initial encounter for closed fracture: Secondary | ICD-10-CM

## 2012-07-03 DIAGNOSIS — S82109A Unspecified fracture of upper end of unspecified tibia, initial encounter for closed fracture: Secondary | ICD-10-CM | POA: Insufficient documentation

## 2012-07-03 DIAGNOSIS — Z8669 Personal history of other diseases of the nervous system and sense organs: Secondary | ICD-10-CM | POA: Insufficient documentation

## 2012-07-03 DIAGNOSIS — Y929 Unspecified place or not applicable: Secondary | ICD-10-CM | POA: Insufficient documentation

## 2012-07-03 DIAGNOSIS — X500XXA Overexertion from strenuous movement or load, initial encounter: Secondary | ICD-10-CM | POA: Insufficient documentation

## 2012-07-03 DIAGNOSIS — R000 Tachycardia, unspecified: Secondary | ICD-10-CM | POA: Insufficient documentation

## 2012-07-03 DIAGNOSIS — Z8719 Personal history of other diseases of the digestive system: Secondary | ICD-10-CM | POA: Insufficient documentation

## 2012-07-03 DIAGNOSIS — R011 Cardiac murmur, unspecified: Secondary | ICD-10-CM | POA: Insufficient documentation

## 2012-07-03 DIAGNOSIS — Z79899 Other long term (current) drug therapy: Secondary | ICD-10-CM | POA: Insufficient documentation

## 2012-07-03 DIAGNOSIS — Z794 Long term (current) use of insulin: Secondary | ICD-10-CM | POA: Insufficient documentation

## 2012-07-03 DIAGNOSIS — M25562 Pain in left knee: Secondary | ICD-10-CM

## 2012-07-03 DIAGNOSIS — E119 Type 2 diabetes mellitus without complications: Secondary | ICD-10-CM | POA: Insufficient documentation

## 2012-07-03 MED ORDER — HYDROCODONE-ACETAMINOPHEN 5-325 MG PO TABS
1.0000 | ORAL_TABLET | Freq: Once | ORAL | Status: AC
  Administered 2012-07-03: 1 via ORAL
  Filled 2012-07-03: qty 1

## 2012-07-03 MED ORDER — CYCLOBENZAPRINE HCL 10 MG PO TABS: 10.0000 mg | ORAL_TABLET | Freq: Once | ORAL | Status: DC

## 2012-07-03 NOTE — ED Provider Notes (Signed)
History     CSN: [id number]  Arrival date & time 07/03/12  1850   First MD Initiated Contact with Patient 07/03/12 1854      Chief Complaint  Patient presents with  . Knee Injury    (Consider location/radiation/quality/duration/timing/severity/associated sxs/prior treatment) Patient is a 57 y.o. female presenting with knee pain. The history is provided by the patient.  Knee Pain Injury: yes   Mechanism of injury comment:  Twist Pain details:    Quality:  Shooting and sharp   Radiates to:  Does not radiate   Severity:  Moderate   Onset quality:  Sudden   Duration:  1 day   Timing:  Constant   Progression:  Worsening Chronicity:  New Dislocation: no   Foreign body present:  No foreign bodies Prior injury to area:  No Worsened by:  Bearing weight Ineffective treatments:  None tried  KERBI DUGUE is a 57 y.o. female who presents to the ED with left knee pain. She states that last night she went to pick up her grandchild and she turned one way and her knee didn't. It was very painful. Today the pain continues.   Past Medical History  Diagnosis Date  . Chronic diarrhea   . History of kidney stones   . Heart murmur   . Neuropathy     Hx: of  . GERD (gastroesophageal reflux disease)   . Diabetes mellitus     fasting blood sugar 110-120s    Past Surgical History  Procedure Laterality Date  . Colonoscopy  02/24/2010  . Tubal ligation    . Multiple extractions with alveoloplasty N/A 06/13/2012    Procedure: MULTIPLE EXTRACION WITH ALVEOLOPLASTY EXTRACT: 18, 19, 20, 21, 22, 24, 25, 27, 28, 29, 30, 31;  Surgeon: Gae Bon, DDS;  Location: [facility];  Service: Oral Surgery;  Laterality: N/A;    No family history on file.  History  Substance Use Topics  . Smoking status: Never Smoker   . Smokeless tobacco: Not on file  . Alcohol Use: 3.0 oz/week    6 drink(s) per week     Comment: weekly    OB History   Grav Para Term Preterm Abortions TAB SAB Ect Mult Living                   Review of Systems  Gastrointestinal: Negative for nausea and vomiting.  Musculoskeletal:       Left knee pain  Skin: Negative for wound.  Neurological: Negative for headaches.  Psychiatric/Behavioral: The patient is not nervous/anxious.     Allergies  Aspirin  Home Medications   Current Outpatient Rx  Name  Route  Sig  Dispense  Refill  . buPROPion (WELLBUTRIN XL) 300 MG 24 hr tablet   Oral   Take 300 mg by mouth every morning.          . chlordiazePOXIDE (LIBRIUM) 25 MG capsule   Oral   Take 25 mg by mouth 4 (four) times daily -  before meals and at bedtime.         . cholecalciferol (VITAMIN D) 1000 UNITS tablet   Oral   Take 1,000 Units by mouth every morning.         . Cyanocobalamin (B-12) 100 MCG TABS   Oral   Take 1 tablet by mouth every morning.          Marland Kitchen esomeprazole (NEXIUM) 40 MG capsule   Oral   Take 40 mg by mouth  2 (two) times daily.          . fenofibrate (TRICOR) 145 MG tablet   Oral   Take 145 mg by mouth every morning.         . folic acid (RA FOLIC ACID) Q000111Q MCG tablet   Oral   Take 800 mcg by mouth every morning.          . insulin aspart (NOVOLOG) 100 UNIT/ML injection   Subcutaneous   Inject 14 Units into the skin 3 (three) times daily before meals.         . insulin glargine (LANTUS) 100 UNIT/ML injection   Subcutaneous   Inject 44 Units into the skin at bedtime.         Marland Kitchen oxycodone (ROXICODONE) 30 MG immediate release tablet   Oral   Take 30 mg by mouth every 12 (twelve) hours as needed for pain.         . potassium chloride SA (K-DUR,KLOR-CON) 20 MEQ tablet   Oral   Take 20 mEq by mouth 2 (two) times daily.         Marland Kitchen rOPINIRole (REQUIP) 0.5 MG tablet   Oral   Take 0.5 mg by mouth every evening.         . simvastatin (ZOCOR) 40 MG tablet   Oral   Take 40 mg by mouth every evening.           BP 135/78  Pulse 117  Temp(Src) 98.6 F (37 C) (Oral)  Resp 20  Ht 5' (1.524 m)   Wt 100 lb (45.36 kg)  BMI 19.53 kg/m2  SpO2 100%  Physical Exam  Nursing note and vitals reviewed. Constitutional: She is oriented to person, place, and time. She appears well-developed and well-nourished. No distress.  HENT:  Head: Atraumatic.  Eyes: EOM are normal.  Neck: Neck supple.  Cardiovascular: Tachycardia present.   Pulmonary/Chest: Effort normal.  Musculoskeletal:       Left knee: She exhibits decreased range of motion and bony tenderness. She exhibits no deformity and normal alignment. Tenderness found. LCL tenderness noted.  Pedal pulse present bilateral and equal. Adequate circulation, good touch sensation.  Neurological: She is alert and oriented to person, place, and time. No cranial nerve deficit or sensory deficit.  Skin: Skin is warm and dry.  Psychiatric: She has a normal mood and affect. Her behavior is normal.    ED Course  Procedures (including critical care time) Dg Knee Complete 4 Views Left  07/03/2012   *RADIOLOGY REPORT*  Clinical Data: Fall.  Left knee injury and pain.  LEFT KNEE - COMPLETE 4+ VIEW  Comparison: None.  Findings: Nondepressed fracture is seen involving the lateral tibial plateau.  No other fractures are identified.  No evidence of dislocation.  No evidence of knee joint effusion.  IMPRESSION: Nondepressed fracture of the lateral tibial plateau.   Original Report Authenticated By: Earle Gell, M.D.     MDM  57 y.o. female with left knee pain after twisting her knee last night.  Fracture of lateral tibial plateau.  I have reviewed this patient's vital signs, nurses notes, appropriate labs and imaging.  I have discussed findings and plan of care with the patient and her family and they voice understanding. She will follow up with Dr. Luna Glasgow in the morning. Knee immobilizer place, crutches, ice pack. Patient has oxycodone 30 mg tabs at home that she takes for pain. Patient discharged home without any immediate complication.  [place], [place] 07/03/12 2108

## 2012-07-03 NOTE — ED Provider Notes (Signed)
Medical screening examination/treatment/procedure(s) were performed by non-physician practitioner and as supervising physician I was immediately available for consultation/collaboration.  Veryl Speak, MD 07/03/12 [phone]

## 2012-07-03 NOTE — ED Notes (Signed)
[  pt states that she was trying to pick up her grandson last night and twisted her left knee, cms intact distal

## 2012-07-03 NOTE — ED Notes (Signed)
Pt c/o left knee pain since last night. Pt states she "twisted" her knee while sitting down.

## 2012-08-24 ENCOUNTER — Ambulatory Visit (INDEPENDENT_AMBULATORY_CARE_PROVIDER_SITE_OTHER): Payer: Medicaid Other | Admitting: Internal Medicine

## 2012-09-09 ENCOUNTER — Encounter (HOSPITAL_COMMUNITY): Payer: Self-pay

## 2012-09-09 ENCOUNTER — Inpatient Hospital Stay (HOSPITAL_COMMUNITY)
Admission: EM | Admit: 2012-09-09 | Discharge: 2012-09-11 | DRG: 637 | Disposition: A | Discharge status: Home / Self Care | Payer: Medicaid Other | Attending: Internal Medicine | Admitting: Internal Medicine

## 2012-09-09 ENCOUNTER — Emergency Department (HOSPITAL_COMMUNITY): Payer: Medicaid Other

## 2012-09-09 DIAGNOSIS — E1310 Other specified diabetes mellitus with ketoacidosis without coma: Principal | ICD-10-CM | POA: Diagnosis present

## 2012-09-09 DIAGNOSIS — G589 Mononeuropathy, unspecified: Secondary | ICD-10-CM | POA: Diagnosis present

## 2012-09-09 DIAGNOSIS — R7989 Other specified abnormal findings of blood chemistry: Secondary | ICD-10-CM

## 2012-09-09 DIAGNOSIS — J690 Pneumonitis due to inhalation of food and vomit: Secondary | ICD-10-CM

## 2012-09-09 DIAGNOSIS — E872 Acidosis, unspecified: Secondary | ICD-10-CM

## 2012-09-09 DIAGNOSIS — R945 Abnormal results of liver function studies: Secondary | ICD-10-CM

## 2012-09-09 DIAGNOSIS — R011 Cardiac murmur, unspecified: Secondary | ICD-10-CM

## 2012-09-09 DIAGNOSIS — E1110 Type 2 diabetes mellitus with ketoacidosis without coma: Secondary | ICD-10-CM

## 2012-09-09 DIAGNOSIS — K219 Gastro-esophageal reflux disease without esophagitis: Secondary | ICD-10-CM | POA: Diagnosis present

## 2012-09-09 DIAGNOSIS — F1010 Alcohol abuse, uncomplicated: Secondary | ICD-10-CM

## 2012-09-09 DIAGNOSIS — E162 Hypoglycemia, unspecified: Secondary | ICD-10-CM

## 2012-09-09 DIAGNOSIS — J189 Pneumonia, unspecified organism: Secondary | ICD-10-CM

## 2012-09-09 DIAGNOSIS — Z87442 Personal history of urinary calculi: Secondary | ICD-10-CM

## 2012-09-09 DIAGNOSIS — R739 Hyperglycemia, unspecified: Secondary | ICD-10-CM

## 2012-09-09 DIAGNOSIS — E876 Hypokalemia: Secondary | ICD-10-CM

## 2012-09-09 DIAGNOSIS — Z8719 Personal history of other diseases of the digestive system: Secondary | ICD-10-CM

## 2012-09-09 DIAGNOSIS — Z794 Long term (current) use of insulin: Secondary | ICD-10-CM

## 2012-09-09 DIAGNOSIS — G9341 Metabolic encephalopathy: Secondary | ICD-10-CM

## 2012-09-09 DIAGNOSIS — R112 Nausea with vomiting, unspecified: Secondary | ICD-10-CM

## 2012-09-09 DIAGNOSIS — Z9119 Patient's noncompliance with other medical treatment and regimen: Secondary | ICD-10-CM

## 2012-09-09 DIAGNOSIS — E860 Dehydration: Secondary | ICD-10-CM

## 2012-09-09 DIAGNOSIS — Z91199 Patient's noncompliance with other medical treatment and regimen due to unspecified reason: Secondary | ICD-10-CM

## 2012-09-09 LAB — COMPREHENSIVE METABOLIC PANEL
ALT: 19 U/L (ref 0–35)
AST: 19 U/L (ref 0–37)
Alkaline Phosphatase: 80 U/L (ref 39–117)
CO2: 8 mEq/L — CL (ref 19–32)
Chloride: 96 mEq/L (ref 96–112)
GFR calc Af Amer: >90 mL/min (ref >90)
GFR calc non Af Amer: >90 mL/min (ref >90)
Glucose, Bld: 271 mg/dL — ABNORMAL HIGH (ref 70–99)
Potassium: 3.5 mEq/L (ref 3.5–5.1)
Sodium: 138 mEq/L (ref 135–145)

## 2012-09-09 LAB — BLOOD GAS, ARTERIAL
Acid-base deficit: 19 mmol/L — ABNORMAL HIGH (ref 0.0–2.0)
Drawn by: 27407
FIO2: 0.21 %
O2 Saturation: 98 %
pO2, Arterial: 121 mmHg — ABNORMAL HIGH (ref 80.0–100.0)

## 2012-09-09 LAB — CBC WITH DIFFERENTIAL/PLATELET
Basophils Absolute: 0 10*3/uL (ref 0.0–0.1)
Eosinophils Relative: 1 % (ref 0–5)
Lymphocytes Relative: 21 % (ref 12–46)
Lymphs Abs: 2.9 10*3/uL (ref 0.7–4.0)
MCV: 87.7 fL (ref 78.0–100.0)
Neutro Abs: 9.3 10*3/uL — ABNORMAL HIGH (ref 1.7–7.7)
Neutrophils Relative %: 68 % (ref 43–77)
Platelets: 274 10*3/uL (ref 150–400)
RBC: 4.05 MIL/uL (ref 3.87–5.11)
WBC: 13.7 10*3/uL — ABNORMAL HIGH (ref 4.0–10.5)

## 2012-09-09 LAB — URINALYSIS, ROUTINE W REFLEX MICROSCOPIC
Bilirubin Urine: NEGATIVE
Hgb urine dipstick: NEGATIVE
Ketones, ur: >80 mg/dL — AB
Nitrite: NEGATIVE
Urobilinogen, UA: 0.2 mg/dL (ref 0.0–1.0)

## 2012-09-09 LAB — GLUCOSE, CAPILLARY
Glucose-Capillary: 209 mg/dL — ABNORMAL HIGH (ref 70–99)
Glucose-Capillary: 300 mg/dL — ABNORMAL HIGH (ref 70–99)

## 2012-09-09 LAB — KETONES, QUALITATIVE

## 2012-09-09 MED ORDER — DEXTROSE 5 % IV SOLN
500.0000 mg | INTRAVENOUS | Status: DC
  Administered 2012-09-09: 22:00:00 via INTRAVENOUS
  Administered 2012-09-09: 500 mg via INTRAVENOUS
  Filled 2012-09-09 (×3): qty 500

## 2012-09-09 MED ORDER — INSULIN REGULAR HUMAN 100 UNIT/ML IJ SOLN
INTRAMUSCULAR | Status: AC
  Filled 2012-09-09: qty 3

## 2012-09-09 MED ORDER — SODIUM CHLORIDE 0.9 % IV SOLN
INTRAVENOUS | Status: DC
  Administered 2012-09-10 (×2): via INTRAVENOUS

## 2012-09-09 MED ORDER — DEXTROSE-NACL 5-0.45 % IV SOLN
INTRAVENOUS | Status: DC
  Administered 2012-09-10 (×2): via INTRAVENOUS

## 2012-09-09 MED ORDER — CLINDAMYCIN PHOSPHATE 600 MG/50ML IV SOLN
600.0000 mg | Freq: Once | INTRAVENOUS | Status: AC
  Administered 2012-09-09: 600 mg via INTRAVENOUS
  Filled 2012-09-09: qty 50

## 2012-09-09 MED ORDER — DEXTROSE 50 % IV SOLN: 25.0000 mL | INTRAVENOUS | Status: DC | PRN

## 2012-09-09 MED ORDER — POTASSIUM CHLORIDE 10 MEQ/100ML IV SOLN
10.0000 meq | INTRAVENOUS | Status: AC
  Administered 2012-09-10 (×2): 10 meq via INTRAVENOUS
  Filled 2012-09-09 (×2): qty 100

## 2012-09-09 MED ORDER — ONDANSETRON HCL 4 MG/2ML IJ SOLN: 4.0000 mg | INTRAMUSCULAR | Status: DC | PRN

## 2012-09-09 MED ORDER — ENOXAPARIN SODIUM 30 MG/0.3ML ~~LOC~~ SOLN
30.0000 mg | SUBCUTANEOUS | Status: DC
  Administered 2012-09-10: 30 mg via SUBCUTANEOUS
  Filled 2012-09-09 (×2): qty 0.3

## 2012-09-09 MED ORDER — SODIUM CHLORIDE 0.9 % IV SOLN
1000.0000 mL | Freq: Once | INTRAVENOUS | Status: AC
  Administered 2012-09-09: 1000 mL via INTRAVENOUS

## 2012-09-09 MED ORDER — PANTOPRAZOLE SODIUM 40 MG IV SOLR
40.0000 mg | Freq: Every day | INTRAVENOUS | Status: DC
  Administered 2012-09-10 (×2): 40 mg via INTRAVENOUS
  Filled 2012-09-09 (×2): qty 40

## 2012-09-09 MED ORDER — SODIUM CHLORIDE 0.9 % IV SOLN
1000.0000 mL | INTRAVENOUS | Status: DC
  Administered 2012-09-09 (×2): 1000 mL via INTRAVENOUS

## 2012-09-09 MED ORDER — SODIUM CHLORIDE 0.9 % IV SOLN
INTRAVENOUS | Status: DC
  Filled 2012-09-09: qty 1

## 2012-09-09 MED ORDER — SODIUM CHLORIDE 0.9 % IV SOLN
INTRAVENOUS | Status: AC
  Administered 2012-09-09: 23:00:00 via INTRAVENOUS

## 2012-09-09 MED ORDER — DEXTROSE 5 % IV SOLN
1.0000 g | Freq: Once | INTRAVENOUS | Status: AC
  Administered 2012-09-09: 1 g via INTRAVENOUS
  Filled 2012-09-09: qty 10

## 2012-09-09 MED ORDER — SODIUM CHLORIDE 0.9 % IV SOLN
INTRAVENOUS | Status: DC
  Administered 2012-09-09: 2.4 [IU]/h via INTRAVENOUS
  Filled 2012-09-09: qty 1

## 2012-09-09 NOTE — ED Notes (Signed)
RT at the bedside for ABG, called AC to mix insulin drip, will deliver to unit

## 2012-09-09 NOTE — ED Notes (Signed)
Placed pt on cardiac monitor, iv infusing without difficulty.

## 2012-09-09 NOTE — ED Notes (Addendum)
In to look at pt for iv access. Daughter at bedside states we cant ever get her iv's. States the pt always needs a PICC line or central line. Daughter upset and doesn't want pt stuck anymore. edp aware

## 2012-09-09 NOTE — ED Notes (Signed)
Attempted IV access x 2 w/o success. 

## 2012-09-09 NOTE — ED Notes (Signed)
Pt and daughter advised edp stated central line or PICC is not available at this time and IV access is only option. Daughter upset and curses. EDP advised and stated not to stick pt at this time

## 2012-09-09 NOTE — ED Notes (Signed)
CRITICAL VALUE ALERT  Critical value received:  co2 -8  Date of notification:  09/09/12  Time of notification:  1908  Critical value read back:yes  Nurse who received alert:  Marya Amsler RN  MD notified (1st page): Tomi Bamberger  Time of first page:   MD notified (2nd page):  Time of second page:  Responding MD:  Tomi Bamberger  Time MD responded:  [phone]

## 2012-09-09 NOTE — ED Notes (Signed)
CO2 8 per lab, will alert EDP

## 2012-09-09 NOTE — ED Provider Notes (Signed)
CSN: [id number]     Arrival date & time 09/09/12  1705  History    This chart was scribed for Janice Norrie, MD by Roxan Diesel, ED scribe.  This patient was seen in room APA02/APA02 and the patient's care was started at 5:26 PM.    Chief Complaint  Patient presents with  . Abdominal Pain    The history is provided by the patient and a relative. No language interpreter was used.     HPI Comments: Erica [name] is a 56 y.o. female with h/o DM who presents to the Emergency Department complaining of 6 days of polydipsia, decreased urinary output, decreased appetite, SOB, nonproductive cough, generalized weakness and dizziness on standing up.  Both pt and her daughter report that pt vomited throughout one day several days ago but has not vomited recently.  Pt states that 6 days ago she vomited 10 times.  Her daughter states she vomited 3 days ago.  Pt denies blood in vomit.  She also initially denies abdominal pain on evaluation but during physical exam she states that she has been experiencing abdominal pain since her other symptoms began.  She also notes pt has been tolerating food but has been eating slightly less than usal.  Pt's daughter equivocally notes that pt may seem slightly more confused than is typical for her.  In addition she complains of intermittent subjective fever but did not take her temperature at home.  Her daughter denies fever to her knowledge.  On admission pt is afebrile (97.5 F).  Pt denies diarrhea.  She denies recent sick contact.  She states she has been taking all of her medications as prescribed and has not been throwing them up.  Pt's daughter notes that pt was started on a Z pack 3 days ago.  She was seen by her PCP earlier today and was advised to come to the ED to evaluate for possible pneumonia and dehydration.  Pt lives at home with her son and daughter.  She does not smoke cigarettes or drink alcohol and notes that she stopped drinking several months ago.   She does use chewing tobacco.  Pt does not work and is on [employer].        PCP is Dr. Ferd Hibbs   Past Medical History  Diagnosis Date  . Chronic diarrhea   . History of kidney stones   . Heart murmur   . Neuropathy     Hx: of  . GERD (gastroesophageal reflux disease)   . Diabetes mellitus     fasting blood sugar 110-120s    Past Surgical History  Procedure Laterality Date  . Colonoscopy  02/24/2010  . Tubal ligation    . Multiple extractions with alveoloplasty N/A 06/13/2012    Procedure: MULTIPLE EXTRACION WITH ALVEOLOPLASTY EXTRACT: 18, 19, 20, 21, 22, 24, 25, 27, 28, 29, 30, 31;  Surgeon: Gae Bon, DDS;  Location: [facility];  Service: Oral Surgery;  Laterality: N/A;    History reviewed. No pertinent family history.   History  Substance Use Topics  . Smoking status: Never Smoker   . Smokeless tobacco: Not on file  . Alcohol Use: 3.0 oz/week    6 drink(s) per week     Comment: weekly  lives at home Lives with daughter and son Denies alcohol for 6 months On disabilty   OB History   Grav Para Term Preterm Abortions TAB SAB Ect Mult Living  Review of Systems  Constitutional: Positive for fever (Intermittent subjective fever) and appetite change.  Respiratory: Positive for cough and shortness of breath.   Gastrointestinal: Positive for vomiting and abdominal pain (Equivocal). Negative for diarrhea.  Endocrine: Positive for polydipsia.  Genitourinary: Positive for decreased urine volume.  Neurological: Positive for dizziness and weakness.  All other systems reviewed and are negative.      Allergies  Aspirin  Home Medications   No current outpatient prescriptions on file. BP 144/82  Pulse 106  Resp 24  SpO2 100%  Vital signs normal except tachycardia   Physical Exam  Nursing note and vitals reviewed. Constitutional: She is oriented to person, place, and time. She appears well-developed and well-nourished.  Non-toxic  appearance. She does not appear ill. No distress.  HENT:  Head: Normocephalic and atraumatic.  Right Ear: External ear normal.  Left Ear: External ear normal.  Nose: Nose normal. No mucosal edema or rhinorrhea.  Mouth/Throat: Oropharynx is clear and moist and mucous membranes are normal. No dental abscesses or edematous.  Small flakes of black material on tongue (pt dips snuff)  Eyes: Conjunctivae and EOM are normal. Pupils are equal, round, and reactive to light.  Neck: Normal range of motion and full passive range of motion without pain. Neck supple.  Cardiovascular: Normal rate, regular rhythm and normal heart sounds.  Exam reveals no gallop and no friction rub.   No murmur heard. Pulmonary/Chest: Effort normal and breath sounds normal. No respiratory distress. She has no wheezes. She has no rhonchi. She has no rales. She exhibits no tenderness and no crepitus.  Abdominal: Soft. Normal appearance and bowel sounds are normal. She exhibits no distension. There is no tenderness. There is no rebound and no guarding.  Musculoskeletal: Normal range of motion. She exhibits no edema and no tenderness.  Moves all extremities well.   Neurological: She is alert and oriented to person, place, and time. She has normal strength. No cranial nerve deficit.  Skin: Skin is warm, dry and intact. No rash noted. No erythema. No pallor.  Psychiatric: She has a normal mood and affect. Her speech is normal and behavior is normal. Her mood appears not anxious.    ED Course   Medications  azithromycin (ZITHROMAX) 500 mg in dextrose 5 % 250 mL IVPB ( Intravenous New Bag/Given 09/09/12 2159)  insulin regular (NOVOLIN R,HUMULIN R) 1 Units/mL in sodium chloride 0.9 % 100 mL infusion ( Intravenous Duplicate [id number])  dextrose 50 % solution 25 mL (not administered)  enoxaparin (LOVENOX) injection 30 mg (not administered)  0.9 %  sodium chloride infusion (0 mLs Intravenous Stopped 09/09/12 2144)  clindamycin  (CLEOCIN) IVPB 600 mg (0 mg Intravenous Stopped 09/09/12 2156)  cefTRIAXone (ROCEPHIN) 1 g in dextrose 5 % 50 mL IVPB (0 g Intravenous Stopped 09/09/12 2125)     Procedures (including critical care time) DIAGNOSTIC STUDIES: Oxygen Saturation is 100% on room air, normal by my interpretation.    COORDINATION OF CARE: 17:32 PM-Discussed treatment plan which includes labs with pt at bedside and pt agreed to plan.   20:52 pt's neck was prepped with betadyne swab. I attempted EJ access on the left without success. An 20 gauge angiocath was inserted in the Right EJ with good blood return.   Patient was started on antibiotics for possible aspiration pneumonia. She was last admitted in April when she had tooth extractions done. She was also started on antibiotics for possible community acquired pneumonia. Patient is at high risk for  aspiration, she had recent dental extractions she and a history of alcohol abuse. She was given IV fluids for her dehydration. She was started on insulin drip for her DKA with metabolic acidosis and mildly elevated glucose.  22:10 PM-Consult complete with Dr. Megan Salon, Hospitalist. Patient case explained and discussed. Dr. Megan Salon agrees to admit patient to step-down.  Advised starting pt on insulin drip and performing serum ketones, lactic acid and ABG. Call ended at 22:14   Results for orders placed during the hospital encounter of 09/09/12  GLUCOSE, CAPILLARY      Result Value Range   Glucose-Capillary 209 (*) 70 - 99 mg/dL  CBC WITH DIFFERENTIAL      Result Value Range   WBC 13.7 (*) 4.0 - 10.5 K/uL   RBC 4.05  3.87 - 5.11 MIL/uL   Hemoglobin 12.6  12.0 - 15.0 g/dL   HCT 35.5 (*) 36.0 - 46.0 %   MCV 87.7  78.0 - 100.0 fL   MCH 31.1  26.0 - 34.0 pg   MCHC 35.5  30.0 - 36.0 g/dL   RDW 12.7  11.5 - 15.5 %   Platelets 274  150 - 400 K/uL   Neutrophils Relative % 68  43 - 77 %   Neutro Abs 9.3 (*) 1.7 - 7.7 K/uL   Lymphocytes Relative 21  12 - 46 %   Lymphs Abs  2.9  0.7 - 4.0 K/uL   Monocytes Relative 11  3 - 12 %   Monocytes Absolute 1.5 (*) 0.1 - 1.0 K/uL   Eosinophils Relative 1  0 - 5 %   Eosinophils Absolute 0.1  0.0 - 0.7 K/uL   Basophils Relative 0  0 - 1 %   Basophils Absolute 0.0  0.0 - 0.1 K/uL  COMPREHENSIVE METABOLIC PANEL      Result Value Range   Sodium 138  135 - 145 mEq/L   Potassium 3.5  3.5 - 5.1 mEq/L   Chloride 96  96 - 112 mEq/L   CO2 8 (*) 19 - 32 mEq/L   Glucose, Bld 271 (*) 70 - 99 mg/dL   BUN 11  6 - 23 mg/dL   Creatinine, Ser 0.60  0.50 - 1.10 mg/dL   Calcium 9.4  8.4 - 10.5 mg/dL   Total Protein 7.1  6.0 - 8.3 g/dL   Albumin 2.9 (*) 3.5 - 5.2 g/dL   AST 19  0 - 37 U/L   ALT 19  0 - 35 U/L   Alkaline Phosphatase 80  39 - 117 U/L   Total Bilirubin 0.4  0.3 - 1.2 mg/dL   GFR calc non Af Amer >90  >90 mL/min   GFR calc Af Amer >90  >90 mL/min  URINALYSIS, ROUTINE W REFLEX MICROSCOPIC      Result Value Range   Color, Urine YELLOW  YELLOW   APPearance CLEAR  CLEAR   Specific Gravity, Urine >1.030 (*) 1.005 - 1.030   pH 5.5  5.0 - 8.0   Glucose, UA 250 (*) NEGATIVE mg/dL   Hgb urine dipstick NEGATIVE  NEGATIVE   Bilirubin Urine NEGATIVE  NEGATIVE   Ketones, ur >80 (*) NEGATIVE mg/dL   Protein, ur NEGATIVE  NEGATIVE mg/dL   Urobilinogen, UA 0.2  0.0 - 1.0 mg/dL   Nitrite NEGATIVE  NEGATIVE   Leukocytes, UA NEGATIVE  NEGATIVE  ETHANOL      Result Value Range   Alcohol, Ethyl (B) <11  0 - 11 mg/dL  BLOOD GAS, ARTERIAL  Result Value Range   FIO2 0.21     Delivery systems ROOM AIR     pH, Arterial 7.344 (*) 7.350 - 7.450   pCO2 arterial 11.9 (*) 35.0 - 45.0 mmHg   pO2, Arterial 121.0 (*) 80.0 - 100.0 mmHg   Bicarbonate 6.3 (*) 20.0 - 24.0 mEq/L   TCO2 5.8  0 - 100 mmol/L   Acid-base deficit 19.0 (*) 0.0 - 2.0 mmol/L   O2 Saturation 98.0     Patient temperature 37.0     Collection site LEFT BRACHIAL     Drawn by [id number]     Sample type ARTERIAL DRAW     Allens test (pass/fail) PASS  PASS  LACTIC  ACID, PLASMA      Result Value Range   Lactic Acid, Venous 1.1  0.5 - 2.2 mmol/L  KETONES, QUALITATIVE      Result Value Range   Acetone, Bld MODERATE (*) NEGATIVE  GLUCOSE, CAPILLARY      Result Value Range   Glucose-Capillary 300 (*) 70 - 99 mg/dL   Comment 1 Notify RN    BASIC METABOLIC PANEL      Result Value Range   Sodium 137  135 - 145 mEq/L   Potassium 3.6  3.5 - 5.1 mEq/L   Chloride 101  96 - 112 mEq/L   CO2 9 (*) 19 - 32 mEq/L   Glucose, Bld 297 (*) 70 - 99 mg/dL   BUN 8  6 - 23 mg/dL   Creatinine, Ser 0.55  0.50 - 1.10 mg/dL   Calcium 8.5  8.4 - 10.5 mg/dL   GFR calc non Af Amer >90  >90 mL/min   GFR calc Af Amer >90  >90 mL/min  CBC      Result Value Range   WBC 10.6 (*) 4.0 - 10.5 K/uL   RBC 3.94  3.87 - 5.11 MIL/uL   Hemoglobin 12.2  12.0 - 15.0 g/dL   HCT 35.1 (*) 36.0 - 46.0 %   MCV 89.1  78.0 - 100.0 fL   MCH 31.0  26.0 - 34.0 pg   MCHC 34.8  30.0 - 36.0 g/dL   RDW 12.6  11.5 - 15.5 %   Platelets 212  150 - 400 K/uL  GLUCOSE, CAPILLARY      Result Value Range   Glucose-Capillary 270 (*) 70 - 99 mg/dL   Comment 1 Notify RN    GLUCOSE, CAPILLARY      Result Value Range   Glucose-Capillary 217 (*) 70 - 99 mg/dL   Laboratory interpretation all normal except metabolic acidosis, hyperglycemia, concentrated urine c/w dehydration   Dg Chest 2 View  09/09/2012   *RADIOLOGY REPORT*  Clinical Data: Cough and shortness of breath.  Vomiting.  CHEST - 2 VIEW  Comparison: 04/04 and 05/19/2012  Findings: There is peribronchial thickening with a faint area of increased density at the right lung base on the AP view only which could represent an area of pneumonia or aspiration pneumonitis.  Heart size and vascularity are normal.  No effusions.  No osseous abnormality.  IMPRESSION:  1.  Bronchitic changes. 2.  Faint area of density at the right base which I suspect represents aspiration pneumonitis or a small area of pneumonia.   Original Report Authenticated By: Lorriane Shire, M.D.    Dg Abd 2 Views  09/09/2012   *RADIOLOGY REPORT*  Clinical Data: Abdominal pain and vomiting.  ABDOMEN - 2 VIEW  Comparison: Radiographs dated 04/19/2011  Findings: There  is no free air in the abdomen.  There is air scattered throughout nondistended loops of large and small bowel. Extensive calcific pancreatitis.  No acute osseous abnormality.  IMPRESSION: Calcific pancreatitis.   Original Report Authenticated By: Lorriane Shire, M.D.    1. Metabolic acidemia   2. Hyperglycemia   3. Dehydration     Plan admission   Rolland Porter, MD, FACEP   CRITICAL CARE Performed by: Rolland Porter L Total critical care time: 31 min Critical care time was exclusive of separately billable procedures and treating other patients. Critical care was necessary to treat or prevent imminent or life-threatening deterioration. Critical care was time spent personally by me on the following activities: development of treatment plan with patient and/or surrogate as well as nursing, discussions with consultants, evaluation of patient's response to treatment, examination of patient, obtaining history from patient or surrogate, ordering and performing treatments and interventions, ordering and review of laboratory studies, ordering and review of radiographic studies, pulse oximetry and re-evaluation of patient's condition.    MDM    I personally performed the services described in this documentation, which was scribed in my presence. The recorded information has been reviewed and considered.  Rolland Porter, MD, FACEP    Janice Norrie, MD 09/10/12 [phone]

## 2012-09-09 NOTE — ED Notes (Signed)
Daughter informed of plan of care - waiting on lab and xray results.

## 2012-09-09 NOTE — ED Notes (Signed)
Pt states she has been vomiting since Sunday. Went to her PCP today and was sent here for evaluation.

## 2012-09-10 DIAGNOSIS — E872 Acidosis, unspecified: Secondary | ICD-10-CM

## 2012-09-10 LAB — BASIC METABOLIC PANEL
CO2: 9 mEq/L — CL (ref 19–32)
Calcium: 8.5 mg/dL (ref 8.4–10.5)
Chloride: 107 mEq/L (ref 96–112)
Chloride: 107 mEq/L (ref 96–112)
GFR calc Af Amer: >90 mL/min (ref >90)
GFR calc Af Amer: >90 mL/min (ref >90)
GFR calc non Af Amer: >90 mL/min (ref >90)
Glucose, Bld: 162 mg/dL — ABNORMAL HIGH (ref 70–99)
Potassium: 3 mEq/L — ABNORMAL LOW (ref 3.5–5.1)
Potassium: 3.4 mEq/L — ABNORMAL LOW (ref 3.5–5.1)
Potassium: 3.6 mEq/L (ref 3.5–5.1)
Sodium: 137 mEq/L (ref 135–145)
Sodium: 138 mEq/L (ref 135–145)

## 2012-09-10 LAB — CBC
HCT: 35.1 % — ABNORMAL LOW (ref 36.0–46.0)
Hemoglobin: 12.2 g/dL (ref 12.0–15.0)
WBC: 10.6 10*3/uL — ABNORMAL HIGH (ref 4.0–10.5)

## 2012-09-10 LAB — GLUCOSE, CAPILLARY
Glucose-Capillary: 137 mg/dL — ABNORMAL HIGH (ref 70–99)
Glucose-Capillary: 137 mg/dL — ABNORMAL HIGH (ref 70–99)
Glucose-Capillary: 155 mg/dL — ABNORMAL HIGH (ref 70–99)
Glucose-Capillary: 158 mg/dL — ABNORMAL HIGH (ref 70–99)
Glucose-Capillary: 163 mg/dL — ABNORMAL HIGH (ref 70–99)
Glucose-Capillary: 217 mg/dL — ABNORMAL HIGH (ref 70–99)
Glucose-Capillary: 231 mg/dL — ABNORMAL HIGH (ref 70–99)
Glucose-Capillary: 273 mg/dL — ABNORMAL HIGH (ref 70–99)
Glucose-Capillary: 344 mg/dL — ABNORMAL HIGH (ref 70–99)
Glucose-Capillary: 434 mg/dL — ABNORMAL HIGH (ref 70–99)
Glucose-Capillary: 67 mg/dL — ABNORMAL LOW (ref 70–99)

## 2012-09-10 MED ORDER — PIPERACILLIN-TAZOBACTAM 3.375 G IVPB
3.3750 g | Freq: Three times a day (TID) | INTRAVENOUS | Status: DC
  Filled 2012-09-10 (×8): qty 50

## 2012-09-10 MED ORDER — VANCOMYCIN HCL IN DEXTROSE 1-5 GM/200ML-% IV SOLN
1000.0000 mg | Freq: Once | INTRAVENOUS | Status: AC
  Administered 2012-09-10: 1000 mg via INTRAVENOUS
  Filled 2012-09-10: qty 200

## 2012-09-10 MED ORDER — INSULIN ASPART 100 UNIT/ML ~~LOC~~ SOLN
0.0000 [IU] | Freq: Three times a day (TID) | SUBCUTANEOUS | Status: DC
  Administered 2012-09-10: 5 [IU] via SUBCUTANEOUS
  Administered 2012-09-11: 2 [IU] via SUBCUTANEOUS

## 2012-09-10 MED ORDER — LEVOFLOXACIN IN D5W 750 MG/150ML IV SOLN
750.0000 mg | INTRAVENOUS | Status: DC
  Administered 2012-09-10: 750 mg via INTRAVENOUS
  Filled 2012-09-10 (×3): qty 150

## 2012-09-10 MED ORDER — DEXTROSE-NACL 5-0.45 % IV SOLN
INTRAVENOUS | Status: DC
  Administered 2012-09-11: 1000 mL via INTRAVENOUS

## 2012-09-10 MED ORDER — INSULIN REGULAR HUMAN 100 UNIT/ML IJ SOLN
INTRAMUSCULAR | Status: AC
  Filled 2012-09-10: qty 3

## 2012-09-10 MED ORDER — PIPERACILLIN-TAZOBACTAM 3.375 G IVPB
INTRAVENOUS | Status: AC
  Filled 2012-09-10: qty 50

## 2012-09-10 MED ORDER — INSULIN GLARGINE 100 UNIT/ML ~~LOC~~ SOLN
35.0000 [IU] | Freq: Every day | SUBCUTANEOUS | Status: DC
  Administered 2012-09-10: 35 [IU] via SUBCUTANEOUS
  Filled 2012-09-10 (×2): qty 0.35

## 2012-09-10 MED ORDER — INSULIN ASPART 100 UNIT/ML ~~LOC~~ SOLN: 0.0000 [IU] | Freq: Every day | SUBCUTANEOUS | Status: DC

## 2012-09-10 MED ORDER — VANCOMYCIN HCL 500 MG IV SOLR
500.0000 mg | Freq: Two times a day (BID) | INTRAVENOUS | Status: DC
  Filled 2012-09-10 (×5): qty 500

## 2012-09-10 MED ORDER — SODIUM CHLORIDE 0.9 % IV SOLN: INTRAVENOUS | Status: DC

## 2012-09-10 MED ORDER — INSULIN REGULAR HUMAN 100 UNIT/ML IJ SOLN
INTRAMUSCULAR | Status: DC
  Administered 2012-09-10: 1.7 [IU]/h via INTRAVENOUS
  Filled 2012-09-10: qty 1

## 2012-09-10 MED ORDER — INSULIN REGULAR BOLUS VIA INFUSION
0.0000 [IU] | Freq: Three times a day (TID) | INTRAVENOUS | Status: DC
  Filled 2012-09-10: qty 10

## 2012-09-10 MED ORDER — VANCOMYCIN HCL IN DEXTROSE 1-5 GM/200ML-% IV SOLN
INTRAVENOUS | Status: AC
  Filled 2012-09-10: qty 200

## 2012-09-10 MED ORDER — POTASSIUM CHLORIDE 10 MEQ/100ML IV SOLN
10.0000 meq | INTRAVENOUS | Status: AC
  Administered 2012-09-10 (×4): 10 meq via INTRAVENOUS
  Filled 2012-09-10 (×2): qty 100
  Filled 2012-09-10: qty 200

## 2012-09-10 MED ORDER — DEXTROSE 50 % IV SOLN: 25.0000 mL | INTRAVENOUS | Status: DC | PRN

## 2012-09-10 NOTE — Progress Notes (Signed)
Hypoglycemic Event  CBG: 60  Treatment: 15 GM carbohydrate snack  Symptoms: None  Follow-up CBG: Time:1720 CBG Result: 57  Possible Reasons for Event: Inadequate meal intake  Comments/MD notified: Pt eating dinner. Apple juice also given; rechecked blood sugar a 3rd time @ 1750 with a CBG of 73. Pt ate 100% of her dinner. Will continue to monitor.    Keawe Marcello, Garnette Scheuermann  Remember to initiate Hypoglycemia Order Set & complete

## 2012-09-10 NOTE — Progress Notes (Signed)
ANTIBIOTIC CONSULT NOTE - INITIAL  Pharmacy Consult for Zosyn & vancomycin Indication:  Probable aspiration pneumonia  Allergies  Allergen Reactions  . Aspirin Palpitations    Patient Measurements: Height: 5' (152.4 cm) Weight: 86 lb 13.8 oz (39.4 kg) IBW/kg (Calculated) : 45.5   **  Actual body weight << IBW  **  Vital Signs: Temp: 97.6 F (36.4 C) (07/25 2300) Temp src: Oral (07/25 2300) BP: 166/105 mmHg (07/25 2300) Pulse Rate: 97 (07/25 2300) Intake/Output from previous day: 07/25 0701 - 07/26 0700 In: 1100 [I.V.:1100] Out: -  Intake/Output from this shift: Total I/O In: 1100 [I.V.:1100] Out: -   Labs:  Recent Labs  09/09/12 1830 09/09/12 2353  WBC 13.7* 10.6*  HGB 12.6 12.2  PLT 274 212  CREATININE 0.60  --    Estimated Creatinine Clearance: 48.3 ml/min (by C-G formula based on Cr of 0.6). No results found for this basename: VANCOTROUGH, VANCOPEAK, VANCORANDOM, GENTTROUGH, GENTPEAK, GENTRANDOM, TOBRATROUGH, TOBRAPEAK, TOBRARND, AMIKACINPEAK, AMIKACINTROU, AMIKACIN,  in the last 72 hours   Microbiology: No results found for this or any previous visit (from the past 720 hour(s)).  Medical History: Past Medical History  Diagnosis Date  . Chronic diarrhea   . History of kidney stones   . Heart murmur   . Neuropathy     Hx: of  . GERD (gastroesophageal reflux disease)   . Diabetes mellitus     fasting blood sugar 110-120s    Medications:  Scheduled:  . azithromycin  500 mg Intravenous Q24H  . enoxaparin (LOVENOX) injection  30 mg Subcutaneous Q24H  . pantoprazole (PROTONIX) IV  40 mg Intravenous QHS  . potassium chloride  10 mEq Intravenous Q1H   Infusions:  . sodium chloride 999 mL/hr at 09/09/12 2327  . sodium chloride    . dextrose 5 % and 0.45% NaCl    . insulin (NOVOLIN-R) infusion     PRN: dextrose, ondansetron (ZOFRAN) IV  Assessment:    72yr female only 87 lb;  with Serum albumin= 2.9 ; est CrCl 45-39ml/min;  Given 1 dose each of  Rocephin 1gm & Clindamycin 600mg  at approx 2100;  Also on azithromycin 500mg  q24h.  Now to have vancomycin and Zosyn tx started.  Goal of Therapy:   Desire vancomycin trough levels 15-30mcg/ml for pneumonia.  Expect routine Zosyn regimen with CrCl > 17ml/min.  Due to reduced body mass, azithromycin should be reduced from 500mg  to 250mg  after initial 500mg  dose.  Plan:  1.  Zosyn 3.375gm IV q8h (4hr infusions) 2.  Vancomycin 1gm IV x 1, followed by 500mg  IV q12h 3.  MD needs to consider reducing further azithromycin doses to 250mg  q24hr 4.  Will follow with appropriate labs as needed.  Tequila Rottmann, Milta Deiters E 09/10/2012,12:35 AM

## 2012-09-10 NOTE — H&P (Signed)
[facility] History and Physical  BRONNIE [name]  [id number]  DOB: Jun 08, 1955   DOA: 09/09/2012   PCP:   Octavio Graves, DO   Chief Complaint:  Feeling sick for about a week  HPI: Erica [name] is a 57 y.o. female.   Middle-aged [place] American lady with known diabetes type 2, and previous admissions for DKA, has been having symptoms of uncontrolled diabetes for about a week: Polyuria polydipsia and thirst, as cough shortness of breath and abdominal pain. She was reportedly treated with a Z-Pak as an outpatient 3 days ago, but has continued to deteriorate and was brought to the emergency room.  In the emergency room she was noted to be a looking and short of breath,. Blood sugar was marginally elevated but CO2 was markedly low. Hospitalist service was contacted. Subsequent ABG and blood Ketones confirmed DKA.   Rewiew of Systems:  Patient is too sick to give a direct history    Past Medical History  Diagnosis Date  . Chronic diarrhea   . History of kidney stones   . Heart murmur   . Neuropathy     Hx: of  . GERD (gastroesophageal reflux disease)   . Diabetes mellitus     fasting blood sugar 110-120s    Past Surgical History  Procedure Laterality Date  . Colonoscopy  02/24/2010  . Tubal ligation    . Multiple extractions with alveoloplasty N/A 06/13/2012    Procedure: MULTIPLE EXTRACION WITH ALVEOLOPLASTY EXTRACT: 18, 19, 20, 21, 22, 24, 25, 27, 28, 29, 30, 31;  Surgeon: Gae Bon, DDS;  Location: [facility];  Service: Oral Surgery;  Laterality: N/A;    Medications:  HOME MEDS: Prior to Admission medications   Medication Sig Start Date End Date Taking? Authorizing Provider  albuterol (PROAIR HFA) 108 (90 BASE) MCG/ACT inhaler Inhale 2 puffs into the lungs every 6 (six) hours as needed for wheezing or shortness of breath.   Yes Historical Provider, MD  buPROPion (WELLBUTRIN XL) 300 MG 24 hr tablet Take 300 mg by mouth every morning.    Yes Historical  Provider, MD  chlordiazePOXIDE (LIBRIUM) 25 MG capsule Take 25 mg by mouth 4 (four) times daily -  before meals and at bedtime.   Yes Historical Provider, MD  cholecalciferol (VITAMIN D) 1000 UNITS tablet Take 1,000 Units by mouth every morning.   Yes Historical Provider, MD  Cyanocobalamin (B-12) 100 MCG TABS Take 1 tablet by mouth every morning.    Yes Historical Provider, MD  esomeprazole (NEXIUM) 40 MG capsule Take 40 mg by mouth 2 (two) times daily.    Yes Historical Provider, MD  fenofibrate (TRICOR) 145 MG tablet Take 145 mg by mouth every morning.   Yes Historical Provider, MD  folic acid (RA FOLIC ACID) Q000111Q MCG tablet Take 800 mcg by mouth every morning.    Yes Historical Provider, MD  insulin aspart (NOVOLOG) 100 UNIT/ML injection Inject 14 Units into the skin 3 (three) times daily before meals. 05/21/12  Yes Delfina Redwood, MD  insulin glargine (LANTUS) 100 UNIT/ML injection Inject 44 Units into the skin at bedtime. 05/21/12  Yes Delfina Redwood, MD  oxycodone (ROXICODONE) 30 MG immediate release tablet Take 30 mg by mouth every 12 (twelve) hours as needed for pain.   Yes Historical Provider, MD  potassium bicarbonate (K-LYTE) 25 MEQ disintegrating tablet Take 25 mEq by mouth 3 (three) times daily with meals.   Yes Historical Provider, MD  rOPINIRole (REQUIP) 0.5 MG  tablet Take 0.5 mg by mouth every evening.   Yes Historical Provider, MD  simvastatin (ZOCOR) 40 MG tablet Take 40 mg by mouth every evening.   Yes Historical Provider, MD     Allergies:  Allergies  Allergen Reactions  . Aspirin Palpitations    Social History:   reports that she has never smoked. She does not have any smokeless tobacco history on file. She reports that she drinks about 3.0 ounces of alcohol per week. She reports that she does not use illicit drugs.  Family History: History reviewed. No pertinent family history. Unable to up to  Physical Exam: Filed Vitals:   09/09/12 1710 09/09/12 1715  09/09/12 2154 09/09/12 2300  BP:    166/105  Pulse:   101 97  Temp:  97.5 F (36.4 C)  97.6 F (36.4 C)  TempSrc:  Oral  Oral  Resp:   19   Height:    5' (1.524 m)  Weight:    39.4 kg (86 lb 13.8 oz)  SpO2: 100%  97% 100%   Blood pressure 166/105, pulse 97, temperature 97.6 F (36.4 C), temperature source Oral, resp. rate 19, height 5' (1.524 m), weight 39.4 kg (86 lb 13.8 oz), SpO2 100.00%. Body mass index is 16.96 kg/(m^2).   GEN:  Thin ill-looking African American lady lying bed coughing frequently; PSYCH:  alert but somewhat lethargic;   HEENT: Mucous membranes pink, dry, and anicteric; PERRLA; EOM intact;  Breasts:: Not examined CHEST WALL: No tenderness CHEST: Tachypnea, coarse crackles bilaterally HEART: Regular rate and rhythm; no murmurs rubs or gallops BACK: No kyphosis no scoliosis; no CVA tenderness ABDOMEN: , soft non-tender; no masses, no organomegaly, normal abdominal bowel sounds; no pannus; no intertriginous candida. Rectal Exam: Not done EXTREMITIES:  age-appropriate arthropathy of the hands and knees; no edema; no ulcerations. Genitalia: not examined PULSES: 2+ and symmetric SKIN: Normal hydration no rash or ulceration CNS: Cranial nerves 2-12 grossly intact no focal lateralizing neurologic deficit   Labs on Admission:  Basic Metabolic Panel:  Recent Labs Lab 09/09/12 1830  NA 138  K 3.5  CL 96  CO2 8*  GLUCOSE 271*  BUN 11  CREATININE 0.60  CALCIUM 9.4   Liver Function Tests:  Recent Labs Lab 09/09/12 1830  AST 19  ALT 19  ALKPHOS 80  BILITOT 0.4  PROT 7.1  ALBUMIN 2.9*   No results found for this basename: LIPASE, AMYLASE,  in the last 168 hours No results found for this basename: AMMONIA,  in the last 168 hours CBC:  Recent Labs Lab 09/09/12 1830  WBC 13.7*  NEUTROABS 9.3*  HGB 12.6  HCT 35.5*  MCV 87.7  PLT 274   Cardiac Enzymes: No results found for this basename: CKTOTAL, CKMB, CKMBINDEX, TROPONINI,  in the last 168  hours BNP: No components found with this basename: POCBNP,  D-dimer: No components found with this basename: D-DIMER,  CBG:  Recent Labs Lab 09/09/12 1719 09/09/12 2305 09/10/12 0006  GLUCAP 209* 300* 270*    Radiological Exams on Admission: Dg Chest 2 View  09/09/2012   *RADIOLOGY REPORT*  Clinical Data: Cough and shortness of breath.  Vomiting.  CHEST - 2 VIEW  Comparison: 04/04 and 05/19/2012  Findings: There is peribronchial thickening with a faint area of increased density at the right lung base on the AP view only which could represent an area of pneumonia or aspiration pneumonitis.  Heart size and vascularity are normal.  No effusions.  No osseous abnormality.  IMPRESSION:  1.  Bronchitic changes. 2.  Faint area of density at the right base which I suspect represents aspiration pneumonitis or a small area of pneumonia.   Original Report Authenticated By: Lorriane Shire, M.D.   Dg Abd 2 Views  09/09/2012   *RADIOLOGY REPORT*  Clinical Data: Abdominal pain and vomiting.  ABDOMEN - 2 VIEW  Comparison: Radiographs dated 04/19/2011  Findings: There is no free air in the abdomen.  There is air scattered throughout nondistended loops of large and small bowel. Extensive calcific pancreatitis.  No acute osseous abnormality.  IMPRESSION: Calcific pancreatitis.   Original Report Authenticated By: Lorriane Shire, M.D.     Assessment/Plan    Principal Problem:   DKA (diabetic ketoacidoses) Active Problems:   H/O chronic pancreatitis   DKA, type 2   Dehydration Pneumonia, possible aspiration   PLAN: Placed on DKA protocol, with empiric potassium replacement Vancomycin and Zosyn for healthcare associated/aspiration pneumonia N.p.o. for the time being review mental status in the morning   Other plans as per orders.  Code Status: Full code  Family Communication: No family available at this time   Critical care time: 60 minutes.   Wiley Magan Nocturnist [facility] Pager [phone]   09/10/2012, 12:09 AM

## 2012-09-10 NOTE — Progress Notes (Addendum)
[facility] for Zosyn & vancomycin Indication:  Pneumonia  Allergies  Allergen Reactions  . Aspirin Palpitations    Patient Measurements: Height: 5' (152.4 cm) Weight: 86 lb 13.8 oz (39.4 kg) IBW/kg (Calculated) : 45.5  Vital Signs: Temp: 97.6 F (36.4 C) (07/25 2300) Temp src: Oral (07/25 2300) BP: 139/80 mmHg (07/26 0700) Pulse Rate: 89 (07/26 0700) Intake/Output from previous day: 07/25 0701 - 07/26 0700 In: 2200 [I.V.:2000; IV Piggyback:200] Out: 1450 [Urine:1450] Intake/Output from this shift: Total I/O In: 250 [I.V.:150; IV Piggyback:100] Out: 300 [Urine:300]  Labs:  Recent Labs  09/09/12 1830 09/09/12 2353 09/10/12 0346  WBC 13.7* 10.6*  --   HGB 12.6 12.2  --   PLT 274 212  --   CREATININE 0.60 0.55 0.44*   Estimated Creatinine Clearance: 48.3 ml/min (by C-G formula based on Cr of 0.44). No results found for this basename: VANCOTROUGH, Corlis Leak, VANCORANDOM, GENTTROUGH, GENTPEAK, GENTRANDOM, TOBRATROUGH, TOBRAPEAK, TOBRARND, AMIKACINPEAK, AMIKACINTROU, AMIKACIN,  in the last 72 hours   Microbiology: Recent Results (from the past 720 hour(s))  CULTURE, BLOOD (ROUTINE X 2)     Status: None   Collection Time    09/09/12  6:30 PM      Result Value Range Status   Specimen Description Blood RIGHT ANTECUBITAL   Final   Special Requests BOTTLES DRAWN AEROBIC AND ANAEROBIC 6CC   Final   Culture NO GROWTH <24 HRS   Final   Report Status PENDING   Incomplete  CULTURE, BLOOD (ROUTINE X 2)     Status: None   Collection Time    09/10/12  3:45 AM      Result Value Range Status   Specimen Description Blood LEFT ANTECUBITAL   Final   Special Requests BOTTLES DRAWN AEROBIC AND ANAEROBIC 6CC   Final   Culture NO GROWTH <24 HRS   Final   Report Status PENDING   Incomplete  MRSA PCR SCREENING     Status: None   Collection Time    09/10/12  3:47 AM      Result Value Range Status   MRSA by PCR NEGATIVE  NEGATIVE Final   Comment:             The GeneXpert MRSA Assay (FDA     approved for NASAL specimens     only), is one component of a     comprehensive MRSA colonization     surveillance program. It is not     intended to diagnose MRSA     infection nor to guide or     monitor treatment for     MRSA infections.    Medical History: Past Medical History  Diagnosis Date  . Chronic diarrhea   . History of kidney stones   . Heart murmur   . Neuropathy     Hx: of  . GERD (gastroesophageal reflux disease)   . Diabetes mellitus     fasting blood sugar 110-120s    Medications:  Scheduled:  . azithromycin  500 mg Intravenous Q24H  . enoxaparin (LOVENOX) injection  30 mg Subcutaneous Q24H  . pantoprazole (PROTONIX) IV  40 mg Intravenous QHS  . piperacillin-tazobactam (ZOSYN)  IV  3.375 g Intravenous Q8H  . potassium chloride  10 mEq Intravenous Q1 Hr x 4  . vancomycin  500 mg Intravenous Q12H   Infusions:  . sodium chloride    . dextrose 5 % and 0.45% NaCl 150 mL/hr at 09/10/12 0800  . insulin (  NOVOLIN-R) infusion Stopped (09/10/12 0741)   PRN: dextrose, ondansetron (ZOFRAN) IV  Assessment: 57 yo F admitted with DKA and PNA.  She had Zpak as an outpatient with no improvement.  Currently on Vancomycin, Zosyn, & Zithromax.   Low body weight noted.   Pt's renal function is at her baseline.      Goal of Therapy:  Vancomycin trough level 15-73mcg/ml   Plan:  1.  Zosyn 3.375gm IV q8h (4hr infusions) 2.  Vancomycin 500mg  IV q12h 3.  Check Vancomycin trough at steady state 4.  Continue Azithromycin  5. Monitor renal function and cx data   Biagio Borg 09/10/2012,8:03 AM  Update: MD has changed Vanc & Zosyn to Levaquin 750mg  IV q24h.  Dose appropriate for renal function.  Agree to abx change.  Consider also d/c Zithromax since Levaquin covers Legionella.  Pharmacy to sign off.   Netta Cedars, PharmD, BCPS

## 2012-09-10 NOTE — Progress Notes (Signed)
Erica [name] [id number] DOB: 1955-11-23 DOA: 09/09/2012 PCP: Octavio Graves, DO   Subjective: This 57 year old lady came in yesterday with DKA. This morning she says that she has been taking insulin but this is conflicting history compared to where she gave yesterday. She does have a history of several admissions with DKA in the past. She also has had a cough and appears to have a pneumonia. Her acidosis is resolving quickly.           Physical Exam: Blood pressure 139/80, pulse 89, temperature 97.6 F (36.4 C), temperature source Oral, resp. rate 18, height 5' (1.524 m), weight 39.4 kg (86 lb 13.8 oz), SpO2 99.00%. She looks systemically well, cachectic, somewhat clinically dehydrated but I would imagine bed and yesterday. Heart sounds are present without murmurs. Lung fields are clear. She is alert and orientated. There are no focal neurological signs.   Investigations:  Recent Results (from the past 240 hour(s))  CULTURE, BLOOD (ROUTINE X 2)     Status: None   Collection Time    09/09/12  6:30 PM      Result Value Range Status   Specimen Description Blood RIGHT ANTECUBITAL   Final   Special Requests BOTTLES DRAWN AEROBIC AND ANAEROBIC 6CC   Final   Culture NO GROWTH <24 HRS   Final   Report Status PENDING   Incomplete  CULTURE, BLOOD (ROUTINE X 2)     Status: None   Collection Time    09/10/12  3:45 AM      Result Value Range Status   Specimen Description Blood LEFT ANTECUBITAL   Final   Special Requests BOTTLES DRAWN AEROBIC AND ANAEROBIC 6CC   Final   Culture NO GROWTH <24 HRS   Final   Report Status PENDING   Incomplete  MRSA PCR SCREENING     Status: None   Collection Time    09/10/12  3:47 AM      Result Value Range Status   MRSA by PCR NEGATIVE  NEGATIVE Final   Comment:            The GeneXpert MRSA Assay (FDA     approved for NASAL specimens     only), is one component of a     comprehensive MRSA colonization     surveillance program. It is not      intended to diagnose MRSA     infection nor to guide or     monitor treatment for     MRSA infections.     Basic Metabolic Panel:  Recent Labs  09/09/12 2353 09/10/12 0346  NA 137 138  K 3.6 3.0*  CL 101 107  CO2 9* 17*  GLUCOSE 297* 162*  BUN 8 6  CREATININE 0.55 0.44*  CALCIUM 8.5 8.1*   Liver Function Tests:  Recent Labs  09/09/12 1830  AST 19  ALT 19  ALKPHOS 80  BILITOT 0.4  PROT 7.1  ALBUMIN 2.9*     CBC:  Recent Labs  09/09/12 1830 09/09/12 2353  WBC 13.7* 10.6*  NEUTROABS 9.3*  --   HGB 12.6 12.2  HCT 35.5* 35.1*  MCV 87.7 89.1  PLT 274 212    Dg Chest 2 View  09/09/2012   *RADIOLOGY REPORT*  Clinical Data: Cough and shortness of breath.  Vomiting.  CHEST - 2 VIEW  Comparison: 04/04 and 05/19/2012  Findings: There is peribronchial thickening with a faint area of increased density at the right lung base on the AP  view only which could represent an area of pneumonia or aspiration pneumonitis.  Heart size and vascularity are normal.  No effusions.  No osseous abnormality.  IMPRESSION:  1.  Bronchitic changes. 2.  Faint area of density at the right base which I suspect represents aspiration pneumonitis or a small area of pneumonia.   Original Report Authenticated By: Lorriane Shire, M.D.   Dg Abd 2 Views  09/09/2012   *RADIOLOGY REPORT*  Clinical Data: Abdominal pain and vomiting.  ABDOMEN - 2 VIEW  Comparison: Radiographs dated 04/19/2011  Findings: There is no free air in the abdomen.  There is air scattered throughout nondistended loops of large and small bowel. Extensive calcific pancreatitis.  No acute osseous abnormality.  IMPRESSION: Calcific pancreatitis.   Original Report Authenticated By: Lorriane Shire, M.D.      Medications: I have reviewed the patient's current medications.  Impression: 1. DKA, resolving. 2. Dehydration, resolving. 3. Healthcare associated pneumonia, right lower lobe.     Plan: 1. Continue protocol for treatment  of DKA, convert to subcutaneous insulin when appropriate. 2. Start carb  modified diet. 3. Continue to monitor in the intensive care unit for today.  Consultants:  None.   Procedures:  None.   Antibiotics:  Intravenous vancomycin started 09/09/2012.  Intravenous Zosyn started 09/09/2012.                   Code Status: Full code.  Family Communication: Discussed plan with patient at the bedside.   Disposition Plan: Home when medically stable.  Time spent: 15 minutes.   LOS: 1 day   Doree Albee Pager [phone]  09/10/2012, 7:59 AM

## 2012-09-11 LAB — CBC
HCT: 31.4 % — ABNORMAL LOW (ref 36.0–46.0)
Hemoglobin: 11.3 g/dL — ABNORMAL LOW (ref 12.0–15.0)
MCH: 30.6 pg (ref 26.0–34.0)
MCV: 85.1 fL (ref 78.0–100.0)
RBC: 3.69 MIL/uL — ABNORMAL LOW (ref 3.87–5.11)

## 2012-09-11 LAB — LEGIONELLA ANTIGEN, URINE: Legionella Antigen, Urine: NEGATIVE

## 2012-09-11 LAB — COMPREHENSIVE METABOLIC PANEL
ALT: 12 U/L (ref 0–35)
BUN: 4 mg/dL — ABNORMAL LOW (ref 6–23)
CO2: 22 mEq/L (ref 19–32)
Calcium: 8.3 mg/dL — ABNORMAL LOW (ref 8.4–10.5)
GFR calc Af Amer: >90 mL/min (ref >90)
GFR calc non Af Amer: >90 mL/min (ref >90)
Glucose, Bld: 130 mg/dL — ABNORMAL HIGH (ref 70–99)
Sodium: 138 mEq/L (ref 135–145)

## 2012-09-11 LAB — GLUCOSE, CAPILLARY
Glucose-Capillary: 242 mg/dL — ABNORMAL HIGH (ref 70–99)
Glucose-Capillary: 100 mg/dL — ABNORMAL HIGH (ref 70–99)
Glucose-Capillary: 140 mg/dL — ABNORMAL HIGH (ref 70–99)
Glucose-Capillary: 130 mg/dL — ABNORMAL HIGH (ref 70–99)

## 2012-09-11 MED ORDER — POTASSIUM CHLORIDE CRYS ER 20 MEQ PO TBCR
40.0000 meq | EXTENDED_RELEASE_TABLET | Freq: Two times a day (BID) | ORAL | Status: DC
  Administered 2012-09-11: 40 meq via ORAL
  Filled 2012-09-11: qty 2

## 2012-09-11 MED ORDER — VANCOMYCIN HCL 500 MG IV SOLR
500.0000 mg | Freq: Two times a day (BID) | INTRAVENOUS | Status: DC
  Filled 2012-09-11 (×3): qty 500

## 2012-09-11 MED ORDER — LEVOFLOXACIN 500 MG PO TABS: 500.0000 mg | ORAL_TABLET | Freq: Every day | ORAL | Status: AC

## 2012-09-11 NOTE — Plan of Care (Signed)
Problem: Phase I Progression Outcomes Goal: CBGs steadily decreasing on IV insulin drip Outcome: Completed/Met Date Met:  09/11/12 Insulin gtt to off tonight, Lantus 35 units given Goal: Monitor hydration status Outcome: Progressing D5 1/2 NS at 125 at present Goal: Nausea/vomiting controlled with antiemetics Outcome: Not Applicable Date Met:  [id number] No nausea or vomiting

## 2012-09-11 NOTE — Discharge Summary (Signed)
Physician Discharge Summary  Erica [name] [id number] DOB: 21-Feb-1955 DOA: 09/09/2012  PCP: Erica Graves, DO  Admit date: 09/09/2012 Discharge date: 09/11/2012  Time spent: Greater than 30 minutes  Recommendations for Outpatient Follow-up:  1. Follow with primary care physician in one week.   Discharge Diagnoses:  1. Diabetic ketoacidosis, resolved. 2. Healthcare associated pneumonia, right lower lobe, stable. 3. Dehydration, resolved. 4. History of noncompliance.   Discharge Condition: Stable and improved.  Diet recommendation: Carbohydrate modified diet.  Filed Weights   09/09/12 2300 09/11/12 0500  Weight: 39.4 kg (86 lb 13.8 oz) 43.2 kg (95 lb 3.8 oz)    History of present illness:  This 57 year old lady presents to the hospital with symptoms of feeling unwell for one week prior to hospitalization. She was found to be in DKA on admission. Please see initial history as outlined below: HPI:  Erica [name] is a 57 y.o. female. Middle-aged [place] American lady with known diabetes type 2, and previous admissions for DKA, has been having symptoms of uncontrolled diabetes for about a week: Polyuria polydipsia and thirst, as cough shortness of breath and abdominal pain. She was reportedly treated with a Z-Pak as an outpatient 3 days ago, but has continued to deteriorate and was brought to the emergency room.  In the emergency room she was noted to be a looking and short of breath,. Blood sugar was marginally elevated but CO2 was markedly low. Hospitalist service was contacted. Subsequent ABG and blood Ketones confirmed DKA.  Hospital Course:  Patient was admitted to the hospital and started on intravenous insulin and IV fluids for her DKA. She was also found to have a right lower lobe pneumonia and was started on antibiotics. She has made rapid improvement and her DKA  has resolved. Also, as far as the pneumonia is concerned, she has not required any oxygen supplement.  She feels much improved and is tolerating diet. Her sugars are reasonably well controlled. She is now stable for discharge and I stressed the importance of compliance with medications. I've told her to follow with her primary care physician within a week.  Procedures:  None.   Consultations:  None.  Discharge Exam: Filed Vitals:   09/11/12 0300 09/11/12 0400 09/11/12 0500 09/11/12 0600  BP: 137/83 140/84 149/87 158/90  Pulse: 110 103 96 94  Temp:  98.5 F (36.9 C)    TempSrc:  Oral    Resp: 21 22 16 18   Height:      Weight:   43.2 kg (95 lb 3.8 oz)   SpO2: 100% 100% 100% 100%    General: She looks systemically well. She is not toxic or septic. Cardiovascular: Heart sounds are present in sinus rhythm without any murmurs or added sounds. Respiratory: Lung fields are clear. She is alert and orientated without any focal neurological signs.  Discharge Instructions  Discharge Orders   Future Orders Complete By Expires     Diet - low sodium heart healthy  As directed     Increase activity slowly  As directed         Medication List         B-12 100 MCG Tabs  Take 1 tablet by mouth every morning.     buPROPion 300 MG 24 hr tablet  Commonly known as:  WELLBUTRIN XL  Take 300 mg by mouth every morning.     chlordiazePOXIDE 25 MG capsule  Commonly known as:  LIBRIUM  Take 25 mg by mouth 4 (four) times  daily -  before meals and at bedtime.     cholecalciferol 1000 UNITS tablet  Commonly known as:  VITAMIN D  Take 1,000 Units by mouth every morning.     esomeprazole 40 MG capsule  Commonly known as:  NEXIUM  Take 40 mg by mouth 2 (two) times daily.     fenofibrate 145 MG tablet  Commonly known as:  TRICOR  Take 145 mg by mouth every morning.     insulin aspart 100 UNIT/ML injection  Commonly known as:  novoLOG  Inject 14 Units into the skin 3 (three) times daily before meals.     insulin glargine 100 UNIT/ML injection  Commonly known as:  LANTUS  Inject 44  Units into the skin at bedtime.     levofloxacin 500 MG tablet  Commonly known as:  LEVAQUIN  Take 1 tablet (500 mg total) by mouth daily.     oxycodone 30 MG immediate release tablet  Commonly known as:  ROXICODONE  Take 30 mg by mouth every 12 (twelve) hours as needed for pain.     potassium bicarbonate 25 MEQ disintegrating tablet  Commonly known as:  K-LYTE  Take 25 mEq by mouth 3 (three) times daily with meals.     PROAIR HFA 108 (90 BASE) MCG/ACT inhaler  Generic drug:  albuterol  Inhale 2 puffs into the lungs every 6 (six) hours as needed for wheezing or shortness of breath.     RA FOLIC ACID Q000111Q MCG tablet  Generic drug:  folic acid  Take Q000111Q mcg by mouth every morning.     rOPINIRole 0.5 MG tablet  Commonly known as:  REQUIP  Take 0.5 mg by mouth every evening.     simvastatin 40 MG tablet  Commonly known as:  ZOCOR  Take 40 mg by mouth every evening.       Allergies  Allergen Reactions  . Aspirin Palpitations       Follow-up Information   Follow up with Rhine, Rogers, DO. Schedule an appointment as soon as possible for a visit in 1 week.   Contact information:   [address] [address] [phone]        The results of significant diagnostics from this hospitalization (including imaging, microbiology, ancillary and laboratory) are listed below for reference.    Significant Diagnostic Studies: Dg Chest 2 View  09/09/2012   *RADIOLOGY REPORT*  Clinical Data: Cough and shortness of breath.  Vomiting.  CHEST - 2 VIEW  Comparison: 04/04 and 05/19/2012  Findings: There is peribronchial thickening with a faint area of increased density at the right lung base on the AP view only which could represent an area of pneumonia or aspiration pneumonitis.  Heart size and vascularity are normal.  No effusions.  No osseous abnormality.  IMPRESSION:  1.  Bronchitic changes. 2.  Faint area of density at the right base which I suspect represents aspiration  pneumonitis or a small area of pneumonia.   Original Report Authenticated By: Lorriane Shire, M.D.   Dg Abd 2 Views  09/09/2012   *RADIOLOGY REPORT*  Clinical Data: Abdominal pain and vomiting.  ABDOMEN - 2 VIEW  Comparison: Radiographs dated 04/19/2011  Findings: There is no free air in the abdomen.  There is air scattered throughout nondistended loops of large and small bowel. Extensive calcific pancreatitis.  No acute osseous abnormality.  IMPRESSION: Calcific pancreatitis.   Original Report Authenticated By: Lorriane Shire, M.D.    Microbiology: Recent Results (from  the past 240 hour(s))  CULTURE, BLOOD (ROUTINE X 2)     Status: None   Collection Time    09/09/12  6:30 PM      Result Value Range Status   Specimen Description Blood RIGHT ANTECUBITAL   Final   Special Requests BOTTLES DRAWN AEROBIC AND ANAEROBIC 6CC   Final   Culture     Final   Value: GRAM POSITIVE COCCI IN CLUSTERS     Gram Stain Report Called to,Read Back By and Verified With: WAGNER,R @ 2157 ON 09/10/12 BY WOODIE,J     GS DONE @ [facility]   Report Status PENDING   Incomplete  CULTURE, BLOOD (ROUTINE X 2)     Status: None   Collection Time    09/10/12  3:45 AM      Result Value Range Status   Specimen Description Blood LEFT ANTECUBITAL   Final   Special Requests BOTTLES DRAWN AEROBIC AND ANAEROBIC 6CC   Final   Culture NO GROWTH 1 DAY   Final   Report Status PENDING   Incomplete  MRSA PCR SCREENING     Status: None   Collection Time    09/10/12  3:47 AM      Result Value Range Status   MRSA by PCR NEGATIVE  NEGATIVE Final   Comment:            The GeneXpert MRSA Assay (FDA     approved for NASAL specimens     only), is one component of a     comprehensive MRSA colonization     surveillance program. It is not     intended to diagnose MRSA     infection nor to guide or     monitor treatment for     MRSA infections.     Labs: Basic Metabolic Panel:  Recent Labs Lab 09/09/12 1830 09/09/12 2353 09/10/12 0346  09/10/12 0738 09/11/12 0533  NA 138 137 138 137 138  K 3.5 3.6 3.0* 3.4* 2.9*  CL 96 101 107 107 107  CO2 8* 9* 17* 19 22  GLUCOSE 271* 297* 162* 151* 130*  BUN 11 8 6  5* 4*  CREATININE 0.60 0.55 0.44* 0.41* 0.48*  CALCIUM 9.4 8.5 8.1* 8.4 8.3*   Liver Function Tests:  Recent Labs Lab 09/09/12 1830 09/11/12 0533  AST 19 16  ALT 19 12  ALKPHOS 80 61  BILITOT 0.4 0.2*  PROT 7.1 5.5*  ALBUMIN 2.9* 2.1*     CBC:  Recent Labs Lab 09/09/12 1830 09/09/12 2353 09/11/12 0533  WBC 13.7* 10.6* 7.4  NEUTROABS 9.3*  --   --   HGB 12.6 12.2 11.3*  HCT 35.5* 35.1* 31.4*  MCV 87.7 89.1 85.1  PLT 274 212 263     CBG:  Recent Labs Lab 09/11/12 0142 09/11/12 0235 09/11/12 0336 09/11/12 0506 09/11/12 0717  GLUCAP 242* 180* 100* 140* 130*       Signed:  Drexel C  [facility] 09/11/2012, 7:49 AM

## 2012-09-13 LAB — CULTURE, RESPIRATORY W GRAM STAIN

## 2012-09-15 LAB — CULTURE, BLOOD (ROUTINE X 2)

## 2012-09-24 ENCOUNTER — Encounter (HOSPITAL_COMMUNITY): Payer: Self-pay | Admitting: Emergency Medicine

## 2012-09-24 ENCOUNTER — Emergency Department (HOSPITAL_COMMUNITY)
Admission: EM | Admit: 2012-09-24 | Discharge: 2012-09-24 | Disposition: A | Discharge status: Home / Self Care | Payer: Medicaid Other | Attending: Emergency Medicine | Admitting: Emergency Medicine

## 2012-09-24 ENCOUNTER — Emergency Department (HOSPITAL_COMMUNITY): Payer: Medicaid Other

## 2012-09-24 DIAGNOSIS — Z87442 Personal history of urinary calculi: Secondary | ICD-10-CM | POA: Insufficient documentation

## 2012-09-24 DIAGNOSIS — R011 Cardiac murmur, unspecified: Secondary | ICD-10-CM | POA: Insufficient documentation

## 2012-09-24 DIAGNOSIS — Z794 Long term (current) use of insulin: Secondary | ICD-10-CM | POA: Insufficient documentation

## 2012-09-24 DIAGNOSIS — Z79899 Other long term (current) drug therapy: Secondary | ICD-10-CM | POA: Insufficient documentation

## 2012-09-24 DIAGNOSIS — Z8701 Personal history of pneumonia (recurrent): Secondary | ICD-10-CM | POA: Insufficient documentation

## 2012-09-24 DIAGNOSIS — R0602 Shortness of breath: Secondary | ICD-10-CM | POA: Insufficient documentation

## 2012-09-24 DIAGNOSIS — E119 Type 2 diabetes mellitus without complications: Secondary | ICD-10-CM | POA: Insufficient documentation

## 2012-09-24 DIAGNOSIS — R000 Tachycardia, unspecified: Secondary | ICD-10-CM | POA: Insufficient documentation

## 2012-09-24 DIAGNOSIS — R112 Nausea with vomiting, unspecified: Secondary | ICD-10-CM | POA: Insufficient documentation

## 2012-09-24 DIAGNOSIS — E1169 Type 2 diabetes mellitus with other specified complication: Secondary | ICD-10-CM | POA: Insufficient documentation

## 2012-09-24 DIAGNOSIS — R42 Dizziness and giddiness: Secondary | ICD-10-CM | POA: Insufficient documentation

## 2012-09-24 DIAGNOSIS — R05 Cough: Secondary | ICD-10-CM | POA: Insufficient documentation

## 2012-09-24 DIAGNOSIS — R197 Diarrhea, unspecified: Secondary | ICD-10-CM | POA: Insufficient documentation

## 2012-09-24 DIAGNOSIS — R509 Fever, unspecified: Secondary | ICD-10-CM | POA: Insufficient documentation

## 2012-09-24 DIAGNOSIS — R5381 Other malaise: Secondary | ICD-10-CM | POA: Insufficient documentation

## 2012-09-24 DIAGNOSIS — Z8669 Personal history of other diseases of the nervous system and sense organs: Secondary | ICD-10-CM | POA: Insufficient documentation

## 2012-09-24 DIAGNOSIS — R5383 Other fatigue: Secondary | ICD-10-CM | POA: Insufficient documentation

## 2012-09-24 DIAGNOSIS — R739 Hyperglycemia, unspecified: Secondary | ICD-10-CM

## 2012-09-24 DIAGNOSIS — R059 Cough, unspecified: Secondary | ICD-10-CM | POA: Insufficient documentation

## 2012-09-24 DIAGNOSIS — E860 Dehydration: Secondary | ICD-10-CM | POA: Insufficient documentation

## 2012-09-24 DIAGNOSIS — K219 Gastro-esophageal reflux disease without esophagitis: Secondary | ICD-10-CM | POA: Insufficient documentation

## 2012-09-24 LAB — URINALYSIS, ROUTINE W REFLEX MICROSCOPIC
Bilirubin Urine: NEGATIVE
Glucose, UA: >1000 mg/dL — AB
Hgb urine dipstick: NEGATIVE
Ketones, ur: NEGATIVE mg/dL
Leukocytes, UA: NEGATIVE
Nitrite: NEGATIVE
Protein, ur: NEGATIVE mg/dL
Specific Gravity, Urine: 1.015 (ref 1.005–1.030)
Urobilinogen, UA: 0.2 mg/dL (ref 0.0–1.0)
pH: 6 (ref 5.0–8.0)

## 2012-09-24 LAB — CBC WITH DIFFERENTIAL/PLATELET
Band Neutrophils: 0 % (ref 0–10)
Basophils Absolute: 0.1 10*3/uL (ref 0.0–0.1)
Basophils Relative: 1 % (ref 0–1)
Blasts: 0 %
Eosinophils Absolute: 0 10*3/uL (ref 0.0–0.7)
Eosinophils Relative: 0 % (ref 0–5)
HCT: 32.1 % — ABNORMAL LOW (ref 36.0–46.0)
Hemoglobin: 10.9 g/dL — ABNORMAL LOW (ref 12.0–15.0)
Lymphocytes Relative: 17 % (ref 12–46)
Lymphs Abs: 0.9 10*3/uL (ref 0.7–4.0)
MCV: 89.4 fL (ref 78.0–100.0)
Metamyelocytes Relative: 0 %
Monocytes Absolute: 0.1 10*3/uL (ref 0.1–1.0)
Monocytes Relative: 2 % — ABNORMAL LOW (ref 3–12)
RDW: 12.9 % (ref 11.5–15.5)
WBC: 5.5 10*3/uL (ref 4.0–10.5)

## 2012-09-24 LAB — GLUCOSE, CAPILLARY
Glucose-Capillary: 338 mg/dL — ABNORMAL HIGH (ref 70–99)
Glucose-Capillary: 190 mg/dL — ABNORMAL HIGH (ref 70–99)

## 2012-09-24 LAB — COMPREHENSIVE METABOLIC PANEL
Albumin: 2.8 g/dL — ABNORMAL LOW (ref 3.5–5.2)
Alkaline Phosphatase: 77 U/L (ref 39–117)
BUN: 21 mg/dL (ref 6–23)
Calcium: 8.7 mg/dL (ref 8.4–10.5)
GFR calc Af Amer: >90 mL/min (ref >90)
Glucose, Bld: 367 mg/dL — ABNORMAL HIGH (ref 70–99)
Potassium: 4 mEq/L (ref 3.5–5.1)
Sodium: 137 mEq/L (ref 135–145)
Total Protein: 6.1 g/dL (ref 6.0–8.3)

## 2012-09-24 LAB — URINE MICROSCOPIC-ADD ON

## 2012-09-24 LAB — PRO B NATRIURETIC PEPTIDE: Pro B Natriuretic peptide (BNP): 38.8 pg/mL (ref 0–125)

## 2012-09-24 LAB — LIPASE, BLOOD: Lipase: 9 U/L — ABNORMAL LOW (ref 11–59)

## 2012-09-24 MED ORDER — SODIUM CHLORIDE 0.9 % IV SOLN: INTRAVENOUS | Status: DC

## 2012-09-24 MED ORDER — SODIUM CHLORIDE 0.9 % IV BOLUS (SEPSIS)
1000.0000 mL | Freq: Once | INTRAVENOUS | Status: AC
  Administered 2012-09-24: 1000 mL via INTRAVENOUS

## 2012-09-24 MED ORDER — INSULIN ASPART 100 UNIT/ML ~~LOC~~ SOLN
10.0000 [IU] | Freq: Once | SUBCUTANEOUS | Status: AC
  Administered 2012-09-24: 10 [IU] via INTRAVENOUS
  Filled 2012-09-24: qty 1

## 2012-09-24 NOTE — ED Notes (Signed)
Patient recently treated for pneumonia, reports took last dose of antibiotics yesterday. Complaining of right-sided chest pain. Also reports diffuse abdominal pain, nonproductive cough, generalized weakness and fatigue, and dizziness.

## 2012-09-24 NOTE — ED Notes (Signed)
Per ems patient's blood sugar was 564 on arrival. Patient states took 15 units of insulin per sliding scale.

## 2012-09-24 NOTE — ED Provider Notes (Addendum)
CSN: :[id number]     Arrival date & time 09/24/12  1920 History     First MD Initiated Contact with Patient 09/24/12 1929     Chief Complaint  Patient presents with  . Chest Pain  . Fatigue  . Cough   (Consider location/radiation/quality/duration/timing/severity/associated sxs/prior Treatment) The history is provided by the patient and the EMS personnel.   57 year old female primary care Dr. is Dr. Melina Copa and stone filled. Patient recently admitted to the hospital for pneumonia and diabetic problems on July 25. Patient states she was in the hospital for a day and then discharged. Patient just finished up her antibiotics for the pneumonia one day ago. Patient called EMS for dizziness and feeling out of balance in fearful that she was going to pass out. She also has some right lateral very inferior chest pain that just started today. No anterior chest pain associated with shortness of breath and fever she had nausea and vomiting x2. No diarrhea. Patient states that her blood sugars have been doing fine she's been eating well until today when with the nausea and vomiting she has not been able to keep anything down. Patient arrived on oxygen her saturation on 2 L was 100%. Patient not normally on oxygen at home. Patient is alert and appears to be in no distress. Monitor shows a sinus tachycardia. Heart rate of 120.  Past Medical History  Diagnosis Date  . Chronic diarrhea   . History of kidney stones   . Heart murmur   . Neuropathy     Hx: of  . GERD (gastroesophageal reflux disease)   . Diabetes mellitus     fasting blood sugar 110-120s   Past Surgical History  Procedure Laterality Date  . Colonoscopy  02/24/2010  . Tubal ligation    . Multiple extractions with alveoloplasty N/A 06/13/2012    Procedure: MULTIPLE EXTRACION WITH ALVEOLOPLASTY EXTRACT: 18, 19, 20, 21, 22, 24, 25, 27, 28, 29, 30, 31;  Surgeon: Gae Bon, DDS;  Location: [facility];  Service: Oral Surgery;  Laterality: N/A;    History reviewed. No pertinent family history. History  Substance Use Topics  . Smoking status: Never Smoker   . Smokeless tobacco: Not on file  . Alcohol Use: 3.0 oz/week    6 drink(s) per week     Comment: weekly   OB History   Grav Para Term Preterm Abortions TAB SAB Ect Mult Living                 Review of Systems  Constitutional: Positive for fever.  HENT: Negative for congestion.   Eyes: Negative for redness and visual disturbance.  Respiratory: Positive for cough and shortness of breath.   Cardiovascular: Positive for chest pain. Negative for leg swelling.  Gastrointestinal: Positive for nausea and vomiting.  Genitourinary: Negative for dysuria and hematuria.  Musculoskeletal: Negative for back pain.  Skin: Negative for rash.  Neurological: Positive for dizziness and light-headedness. Negative for headaches.  Hematological: Does not bruise/bleed easily.  Psychiatric/Behavioral: Negative for confusion.    Allergies  Aspirin  Home Medications   Current Outpatient Rx  Name  Route  Sig  Dispense  Refill  . albuterol (PROAIR HFA) 108 (90 BASE) MCG/ACT inhaler   Inhalation   Inhale 2 puffs into the lungs every 6 (six) hours as needed for wheezing or shortness of breath.         . chlordiazePOXIDE (LIBRIUM) 25 MG capsule   Oral   Take 25 mg by  mouth 4 (four) times daily -  before meals and at bedtime.         . cholecalciferol (VITAMIN D) 1000 UNITS tablet   Oral   Take 1,000 Units by mouth every morning.         . Cyanocobalamin (B-12) 100 MCG TABS   Oral   Take 1 tablet by mouth every morning.          Marland Kitchen esomeprazole (NEXIUM) 40 MG capsule   Oral   Take 40 mg by mouth 2 (two) times daily.          . folic acid (RA FOLIC ACID) Q000111Q MCG tablet   Oral   Take 800 mcg by mouth every morning.          . insulin aspart (NOVOLOG) 100 UNIT/ML injection   Subcutaneous   Inject 14 Units into the skin 3 (three) times daily before meals.          . insulin glargine (LANTUS) 100 UNIT/ML injection   Subcutaneous   Inject 44 Units into the skin at bedtime.         Marland Kitchen oxycodone (ROXICODONE) 30 MG immediate release tablet   Oral   Take 30 mg by mouth every 12 (twelve) hours as needed for pain.         . potassium bicarbonate (K-LYTE) 25 MEQ disintegrating tablet   Oral   Take 25 mEq by mouth 3 (three) times daily with meals.         Marland Kitchen rOPINIRole (REQUIP) 0.5 MG tablet   Oral   Take 0.5 mg by mouth every evening.         Marland Kitchen UNKNOWN TO PATIENT   Oral   Take 1 tablet by mouth daily. ANTIBIOTIC (Pinkk tablet) Name unknown          BP 150/93  Pulse 108  Temp(Src) 98 F (36.7 C) (Oral)  Resp 24  Ht 5' (1.524 m)  Wt 90 lb (40.824 kg)  BMI 17.58 kg/m2  SpO2 100% Physical Exam  Nursing note and vitals reviewed. Constitutional: She is oriented to person, place, and time. She appears well-developed and well-nourished. No distress.  HENT:  Head: Normocephalic and atraumatic.  Mouth/Throat: Oropharynx is clear and moist.  Eyes: Conjunctivae and EOM are normal. Pupils are equal, round, and reactive to light.  Neck: Normal range of motion.  Cardiovascular: Regular rhythm and normal heart sounds.   No murmur heard. Tachycardic  Pulmonary/Chest: Effort normal and breath sounds normal. No respiratory distress.  Abdominal: Soft. Bowel sounds are normal. There is no tenderness.  Musculoskeletal: Normal range of motion. She exhibits no edema.  Neurological: She is alert and oriented to person, place, and time. No cranial nerve deficit. She exhibits normal muscle tone. Coordination normal.  Skin: Skin is warm. No erythema.    ED Course   Procedures (including critical care time)  Labs Reviewed  GLUCOSE, CAPILLARY - Abnormal; Notable for the following:    Glucose-Capillary 397 (*)    All other components within normal limits  COMPREHENSIVE METABOLIC PANEL - Abnormal; Notable for the following:    Glucose, Bld 367 (*)     Albumin 2.8 (*)    AST 48 (*)    ALT 60 (*)    Total Bilirubin 0.2 (*)    All other components within normal limits  CBC WITH DIFFERENTIAL - Abnormal; Notable for the following:    RBC 3.59 (*)    Hemoglobin 10.9 (*)    HCT  32.1 (*)    Neutrophils Relative % 80 (*)    Monocytes Relative 2 (*)    All other components within normal limits  LIPASE, BLOOD - Abnormal; Notable for the following:    Lipase 9 (*)    All other components within normal limits  GLUCOSE, CAPILLARY - Abnormal; Notable for the following:    Glucose-Capillary 338 (*)    All other components within normal limits  URINALYSIS, ROUTINE W REFLEX MICROSCOPIC - Abnormal; Notable for the following:    Glucose, UA >1000 (*)    All other components within normal limits  GLUCOSE, CAPILLARY - Abnormal; Notable for the following:    Glucose-Capillary 190 (*)    All other components within normal limits  PRO B NATRIURETIC PEPTIDE  TROPONIN I  URINE MICROSCOPIC-ADD ON   Dg Chest Port 1 View  09/24/2012   *RADIOLOGY REPORT*  Clinical Data: Chest pain  PORTABLE CHEST - 1 VIEW  Comparison: 09/09/2012  Findings: Normal heart size.  Clear lungs.  No pneumothorax.  IMPRESSION: No active cardiopulmonary disease.   Original Report Authenticated By: Marybelle Killings, M.D.   Results for orders placed during the hospital encounter of 09/24/12  GLUCOSE, CAPILLARY      Result Value Range   Glucose-Capillary 397 (*) 70 - 99 mg/dL  COMPREHENSIVE METABOLIC PANEL      Result Value Range   Sodium 137  135 - 145 mEq/L   Potassium 4.0  3.5 - 5.1 mEq/L   Chloride 106  96 - 112 mEq/L   CO2 21  19 - 32 mEq/L   Glucose, Bld 367 (*) 70 - 99 mg/dL   BUN 21  6 - 23 mg/dL   Creatinine, Ser 0.62  0.50 - 1.10 mg/dL   Calcium 8.7  8.4 - 10.5 mg/dL   Total Protein 6.1  6.0 - 8.3 g/dL   Albumin 2.8 (*) 3.5 - 5.2 g/dL   AST 48 (*) 0 - 37 U/L   ALT 60 (*) 0 - 35 U/L   Alkaline Phosphatase 77  39 - 117 U/L   Total Bilirubin 0.2 (*) 0.3 - 1.2 mg/dL    GFR calc non Af Amer >90  >90 mL/min   GFR calc Af Amer >90  >90 mL/min  CBC WITH DIFFERENTIAL      Result Value Range   WBC 5.5  4.0 - 10.5 K/uL   RBC 3.59 (*) 3.87 - 5.11 MIL/uL   Hemoglobin 10.9 (*) 12.0 - 15.0 g/dL   HCT 32.1 (*) 36.0 - 46.0 %   MCV 89.4  78.0 - 100.0 fL   MCH 30.4  26.0 - 34.0 pg   MCHC 34.0  30.0 - 36.0 g/dL   RDW 12.9  11.5 - 15.5 %   Platelets 208  150 - 400 K/uL   Neutrophils Relative % 80 (*) 43 - 77 %   Lymphocytes Relative 17  12 - 46 %   Monocytes Relative 2 (*) 3 - 12 %   Eosinophils Relative 0  0 - 5 %   Basophils Relative 1  0 - 1 %   Band Neutrophils 0  0 - 10 %   Metamyelocytes Relative 0     Myelocytes 0     Promyelocytes Absolute 0     Blasts 0     nRBC 0  0 /100 WBC   Neutro Abs 4.4  1.7 - 7.7 K/uL   Lymphs Abs 0.9  0.7 - 4.0 K/uL   Monocytes Absolute  0.1  0.1 - 1.0 K/uL   Eosinophils Absolute 0.0  0.0 - 0.7 K/uL   Basophils Absolute 0.1  0.0 - 0.1 K/uL  LIPASE, BLOOD      Result Value Range   Lipase 9 (*) 11 - 59 U/L  PRO B NATRIURETIC PEPTIDE      Result Value Range   Pro B Natriuretic peptide (BNP) 38.8  0 - 125 pg/mL  TROPONIN I      Result Value Range   Troponin I <0.30  <0.30 ng/mL  GLUCOSE, CAPILLARY      Result Value Range   Glucose-Capillary 338 (*) 70 - 99 mg/dL  URINALYSIS, ROUTINE W REFLEX MICROSCOPIC      Result Value Range   Color, Urine YELLOW  YELLOW   APPearance CLEAR  CLEAR   Specific Gravity, Urine 1.015  1.005 - 1.030   pH 6.0  5.0 - 8.0   Glucose, UA >1000 (*) NEGATIVE mg/dL   Hgb urine dipstick NEGATIVE  NEGATIVE   Bilirubin Urine NEGATIVE  NEGATIVE   Ketones, ur NEGATIVE  NEGATIVE mg/dL   Protein, ur NEGATIVE  NEGATIVE mg/dL   Urobilinogen, UA 0.2  0.0 - 1.0 mg/dL   Nitrite NEGATIVE  NEGATIVE   Leukocytes, UA NEGATIVE  NEGATIVE  URINE MICROSCOPIC-ADD ON      Result Value Range   RBC / HPF 0-2  <3 RBC/hpf  GLUCOSE, CAPILLARY      Result Value Range   Glucose-Capillary 190 (*) 70 - 99 mg/dL       Date: 09/24/2012  Rate: 124  Rhythm: sinus tachycardia  QRS Axis: normal  Intervals: normal  ST/T Wave abnormalities: nonspecific ST changes  Conduction Disutrbances:none  Narrative Interpretation:   Old EKG Reviewed: changes noted Compared to 05/31/2012 EKG ST segment changes noted in V1.  The patient up walking in the emergency department without any difficulties. The patient's repeat blood sugar comes in at 338 we'll proceed and treat with 10 units of regular insulin. No evidence of metabolic acidosis. This may just be a hyperglycemic episode. No evidence of pneumonia.  1. Hyperglycemia   2. Dehydration     MDM  Patient's chest x-ray is negative for pneumonia. Patient's blood sugar is elevated at 397 had originally ordered 10 units of regular insulin IV push however patient stated that she took the 15 units of regular insulin about 30 minutes prior to arrival so will recheck blood sugar again in 30 minutes if still elevated will proceed with additional regular insulin. Labs are still pending. Sinus tachycardia without any specific arrhythmia. Patient receiving fluid hydration 1 L normal saline ordered.  Patient's repeat blood sugar was 338 we'll proceed with 10 units of regular insulin IV push. We'll continue hydration. No evidence of metabolic acidosis no evidence of pneumonia. Patient walking in the emergency department without any difficulty. Patient looking markedly improved. Still with tachycardia.  Also suspect a component of dehydration. Family states the patient has not been eating and drinking very well for the past few days despite which told us historically. Currently with hydration heart rate is now down to 109.  Following the regular insulin blood sugar is now down to 190.  We'll continue with fluid hydration. Patient currently received 500 cc of normal saline we'll go ahead and give another 500.      Mervin Kung, MD 09/24/12 2138  Mervin Kung,  MD 09/24/12 [id number]  Mervin Kung, MD 09/24/12 2207

## 2012-10-05 ENCOUNTER — Encounter (HOSPITAL_COMMUNITY): Payer: Self-pay

## 2012-10-05 ENCOUNTER — Emergency Department (HOSPITAL_COMMUNITY)
Admission: EM | Admit: 2012-10-05 | Discharge: 2012-10-05 | Disposition: A | Discharge status: Home / Self Care | Payer: Medicaid Other | Attending: Emergency Medicine | Admitting: Emergency Medicine

## 2012-10-05 DIAGNOSIS — R109 Unspecified abdominal pain: Secondary | ICD-10-CM | POA: Insufficient documentation

## 2012-10-05 DIAGNOSIS — Z79899 Other long term (current) drug therapy: Secondary | ICD-10-CM | POA: Insufficient documentation

## 2012-10-05 DIAGNOSIS — Z9851 Tubal ligation status: Secondary | ICD-10-CM | POA: Insufficient documentation

## 2012-10-05 DIAGNOSIS — E119 Type 2 diabetes mellitus without complications: Secondary | ICD-10-CM | POA: Insufficient documentation

## 2012-10-05 DIAGNOSIS — R51 Headache: Secondary | ICD-10-CM | POA: Insufficient documentation

## 2012-10-05 DIAGNOSIS — R509 Fever, unspecified: Secondary | ICD-10-CM | POA: Insufficient documentation

## 2012-10-05 DIAGNOSIS — K219 Gastro-esophageal reflux disease without esophagitis: Secondary | ICD-10-CM | POA: Insufficient documentation

## 2012-10-05 DIAGNOSIS — R112 Nausea with vomiting, unspecified: Secondary | ICD-10-CM

## 2012-10-05 DIAGNOSIS — R011 Cardiac murmur, unspecified: Secondary | ICD-10-CM | POA: Insufficient documentation

## 2012-10-05 DIAGNOSIS — Z8669 Personal history of other diseases of the nervous system and sense organs: Secondary | ICD-10-CM | POA: Insufficient documentation

## 2012-10-05 DIAGNOSIS — R197 Diarrhea, unspecified: Secondary | ICD-10-CM | POA: Insufficient documentation

## 2012-10-05 DIAGNOSIS — Z794 Long term (current) use of insulin: Secondary | ICD-10-CM | POA: Insufficient documentation

## 2012-10-05 DIAGNOSIS — Z87442 Personal history of urinary calculi: Secondary | ICD-10-CM | POA: Insufficient documentation

## 2012-10-05 LAB — CBC WITH DIFFERENTIAL/PLATELET
Hemoglobin: 13.5 g/dL (ref 12.0–15.0)
Lymphocytes Relative: 24 % (ref 12–46)
Lymphs Abs: 1.7 10*3/uL (ref 0.7–4.0)
MCH: 30.5 pg (ref 26.0–34.0)
Monocytes Relative: 9 % (ref 3–12)
Neutro Abs: 4.5 10*3/uL (ref 1.7–7.7)
Neutrophils Relative %: 66 % (ref 43–77)
Platelets: 271 10*3/uL (ref 150–400)
RBC: 4.42 MIL/uL (ref 3.87–5.11)
WBC: 6.8 10*3/uL (ref 4.0–10.5)

## 2012-10-05 LAB — URINE MICROSCOPIC-ADD ON

## 2012-10-05 LAB — COMPREHENSIVE METABOLIC PANEL
ALT: 117 U/L — ABNORMAL HIGH (ref 0–35)
Alkaline Phosphatase: 96 U/L (ref 39–117)
BUN: 12 mg/dL (ref 6–23)
Chloride: 97 mEq/L (ref 96–112)
GFR calc Af Amer: >90 mL/min (ref >90)
Glucose, Bld: 252 mg/dL — ABNORMAL HIGH (ref 70–99)
Potassium: 4.3 mEq/L (ref 3.5–5.1)
Sodium: 133 mEq/L — ABNORMAL LOW (ref 135–145)
Total Bilirubin: 0.5 mg/dL (ref 0.3–1.2)
Total Protein: 7.2 g/dL (ref 6.0–8.3)

## 2012-10-05 LAB — BLOOD GAS, VENOUS
FIO2: 0.21 %
O2 Content: 21 L/min
pCO2, Ven: 54.9 mmHg — ABNORMAL HIGH (ref 45.0–50.0)
pH, Ven: 7.259 (ref 7.250–7.300)
pO2, Ven: 22 mmHg — CL (ref 30.0–45.0)

## 2012-10-05 LAB — URINALYSIS, ROUTINE W REFLEX MICROSCOPIC
Bilirubin Urine: NEGATIVE
Glucose, UA: >1000 mg/dL — AB
Hgb urine dipstick: NEGATIVE
Protein, ur: NEGATIVE mg/dL
Specific Gravity, Urine: 1.015 (ref 1.005–1.030)
Urobilinogen, UA: 0.2 mg/dL (ref 0.0–1.0)

## 2012-10-05 LAB — LIPASE, BLOOD: Lipase: 8 U/L — ABNORMAL LOW (ref 11–59)

## 2012-10-05 MED ORDER — SODIUM CHLORIDE 0.9 % IV BOLUS (SEPSIS)
1000.0000 mL | Freq: Once | INTRAVENOUS | Status: AC
  Administered 2012-10-05: 1000 mL via INTRAVENOUS

## 2012-10-05 MED ORDER — ONDANSETRON 4 MG PO TBDP: ORAL_TABLET | ORAL | Status: DC

## 2012-10-05 MED ORDER — ONDANSETRON HCL 4 MG/2ML IJ SOLN
4.0000 mg | Freq: Once | INTRAMUSCULAR | Status: AC
  Administered 2012-10-05: 4 mg via INTRAVENOUS
  Filled 2012-10-05: qty 2

## 2012-10-05 NOTE — ED Notes (Signed)
Complain of  N/v/d for two days

## 2012-10-05 NOTE — ED Provider Notes (Signed)
CSN: [id number]     Arrival date & time 10/05/12  1506 History  This chart was scribed for Osvaldo Shipper, MD by Rolanda Lundborg, ED Scribe. This patient was seen in room [facility]14/[facility]14 and the patient's care was started at 3:59 PM.    Chief Complaint  Patient presents with  . Emesis    Patient is a 57 y.o. female presenting with vomiting. The history is provided by the patient. No language interpreter was used.  Emesis Severity:  Moderate Duration:  2 days Timing:  Constant Number of daily episodes:  5-6 Quality:  Stomach contents Progression:  Worsening Chronicity:  Recurrent Relieved by:  Nothing Worsened by:  Nothing tried Associated symptoms: abdominal pain, chills, diarrhea, fever and headaches   Abdominal pain:    Location:  Generalized   Severity:  Moderate Diarrhea:    Quality:  Watery   Number of occurrences:  "many"   Severity:  Moderate   Duration:  2 days   Timing:  Intermittent   Progression:  Worsening Fever:    Timing:  Intermittent   Temp source:  Subjective Headaches:    Severity:  Mild   Timing:  Intermittent  HPI Comments: Erica [name] is a 57 y.o. female brought in by ambulance to the Emergency Department complaining of nausea, vomiting and diarrhea onset 2 days ago. She has been seen twice in the ED for similar symptons in the past two weeks. She reports multiple episodes of vomiting since yesterday but denies hematemesis. She reports "many" episodes of diarrhea since yesterday but denies hematochezia and melena. She also reports constant, moderate abdominal pain since yesterday, along with subjective fever and chills. She denies menorrhagia. Pt has a h/o DM and states that she is compliant with her medications. She reports being on antibiotics recently.  PCP- Octavio Graves  Past Medical History  Diagnosis Date  . Chronic diarrhea   . History of kidney stones   . Heart murmur   . Neuropathy     Hx: of  . GERD (gastroesophageal reflux  disease)   . Diabetes mellitus     fasting blood sugar 110-120s   Past Surgical History  Procedure Laterality Date  . Colonoscopy  02/24/2010  . Tubal ligation    . Multiple extractions with alveoloplasty N/A 06/13/2012    Procedure: MULTIPLE EXTRACION WITH ALVEOLOPLASTY EXTRACT: 18, 19, 20, 21, 22, 24, 25, 27, 28, 29, 30, 31;  Surgeon: Gae Bon, DDS;  Location: [facility];  Service: Oral Surgery;  Laterality: N/A;   No family history on file. History  Substance Use Topics  . Smoking status: Never Smoker   . Smokeless tobacco: Not on file  . Alcohol Use: No     Comment: weekly   OB History   Grav Para Term Preterm Abortions TAB SAB Ect Mult Living                 Review of Systems  Constitutional: Positive for fever and chills.  HENT: Negative for neck pain.   Gastrointestinal: Positive for vomiting, abdominal pain and diarrhea. Negative for blood in stool.  Genitourinary: Negative for vaginal bleeding.  Neurological: Positive for headaches.  All other systems reviewed and are negative.   Allergies  Aspirin  Home Medications   Current Outpatient Rx  Name  Route  Sig  Dispense  Refill  . albuterol (PROAIR HFA) 108 (90 BASE) MCG/ACT inhaler   Inhalation   Inhale 2 puffs into the lungs every 6 (six) hours as  needed for wheezing or shortness of breath.         . chlordiazePOXIDE (LIBRIUM) 25 MG capsule   Oral   Take 25 mg by mouth 4 (four) times daily -  before meals and at bedtime.         . cholecalciferol (VITAMIN D) 1000 UNITS tablet   Oral   Take 1,000 Units by mouth every morning.         . Cyanocobalamin (B-12) 100 MCG TABS   Oral   Take 1 tablet by mouth every morning.          Marland Kitchen esomeprazole (NEXIUM) 40 MG capsule   Oral   Take 40 mg by mouth 2 (two) times daily.          . folic acid (RA FOLIC ACID) Q000111Q MCG tablet   Oral   Take 800 mcg by mouth every morning.          . insulin aspart (NOVOLOG) 100 UNIT/ML injection   Subcutaneous    Inject 14 Units into the skin 3 (three) times daily before meals.         . insulin glargine (LANTUS) 100 UNIT/ML injection   Subcutaneous   Inject 44 Units into the skin at bedtime.         Marland Kitchen lisinopril (PRINIVIL,ZESTRIL) 20 MG tablet   Oral   Take 20 mg by mouth daily.         Marland Kitchen oxycodone (ROXICODONE) 30 MG immediate release tablet   Oral   Take 30 mg by mouth every 12 (twelve) hours as needed for pain.         . potassium bicarbonate (K-LYTE) 25 MEQ disintegrating tablet   Oral   Take 25 mEq by mouth 3 (three) times daily with meals.         Marland Kitchen rOPINIRole (REQUIP) 0.5 MG tablet   Oral   Take 0.5 mg by mouth every evening.          Triage Vitals: BP 129/72  Pulse 108  Temp(Src) 97.5 F (36.4 C) (Oral)  Resp 18  Ht 5' (1.524 m)  Wt 80 lb (36.288 kg)  BMI 15.62 kg/m2  SpO2 100%  Physical Exam  Nursing note and vitals reviewed. Constitutional: She is oriented to person, place, and time. She appears well-developed and well-nourished. No distress.  HENT:  Head: Normocephalic and atraumatic.  Dry mucous membranes.  Eyes: EOM are normal.  Neck: Neck supple. No tracheal deviation present.  Cardiovascular: Normal rate.   Pulmonary/Chest: Effort normal. No respiratory distress.  Musculoskeletal: Normal range of motion.  Neurological: She is alert and oriented to person, place, and time.  Skin: Skin is warm and dry.  Psychiatric: She has a normal mood and affect. Her behavior is normal.    ED Course   Medications  sodium chloride 0.9 % bolus 1,000 mL (0 mLs Intravenous Stopped 10/05/12 1717)  ondansetron (ZOFRAN) injection 4 mg (4 mg Intravenous Given 10/05/12 1617)  sodium chloride 0.9 % bolus 1,000 mL (0 mLs Intravenous Stopped 10/05/12 1928)   DIAGNOSTIC STUDIES: Oxygen Saturation is 100% on room air, normal by my interpretation.    COORDINATION OF CARE: 3:58 PM-Discussed plan for diagnostic lab work and plan to receive medications in the ED with pt at  bedside and pt agreed to plan.    Procedures (including critical care time)  Labs Reviewed  COMPREHENSIVE METABOLIC PANEL - Abnormal; Notable for the following:    Sodium 133 (*)  Glucose, Bld 252 (*)    Albumin 3.4 (*)    AST 59 (*)    ALT 117 (*)    All other components within normal limits  LIPASE, BLOOD - Abnormal; Notable for the following:    Lipase 8 (*)    All other components within normal limits  LACTIC ACID, PLASMA - Abnormal; Notable for the following:    Lactic Acid, Venous 2.6 (*)    All other components within normal limits  URINALYSIS, ROUTINE W REFLEX MICROSCOPIC - Abnormal; Notable for the following:    Glucose, UA >1000 (*)    All other components within normal limits  BLOOD GAS, VENOUS - Abnormal; Notable for the following:    pCO2, Ven 54.9 (*)    pO2, Ven 22.0 (*)    Acid-base deficit 2.4 (*)    All other components within normal limits  GLUCOSE, CAPILLARY - Abnormal; Notable for the following:    Glucose-Capillary 255 (*)    All other components within normal limits  URINE MICROSCOPIC-ADD ON - Abnormal; Notable for the following:    Squamous Epithelial / LPF FEW (*)    All other components within normal limits  CBC WITH DIFFERENTIAL  LACTIC ACID, PLASMA   No results found.  1. Nausea vomiting and diarrhea    Angiocath insertion Performed by: Osvaldo Shipper  Consent: Verbal consent obtained. Risks and benefits: risks, benefits and alternatives were discussed Time out: Immediately prior to procedure a "time out" was called to verify the correct patient, procedure, equipment, support staff and site/side marked as required.  Preparation: Patient was prepped and draped in the usual sterile fashion.  Vein Location: L EJ  Not Ultrasound Guided  Gauge: 20  Normal blood return and flush without difficulty Patient tolerance: Patient tolerated the procedure well with no immediate complications.    MDM  57 year old female with history  of diabetes (nausea, vomiting, diarrhea for 2 days. States some mild abdominal pain. No fevers. No urinary or vaginal symptoms. Had similar episode several weeks ago and was admitted. At that time should DKA and was on insulin. She's been taking her insulin and has had no recent illnesses otherwise. Here vitals are stable. On exam, her belly is very benign. A right upper quadrant tenderness. No right lower quadrant tenderness. No suprapubic tenderness. I do not feel she is imaging at this time.She is dehydrated. Will give fluids. Labs show mild elevation of lactate 2.6. No white count. No anion gap. No signs of DKA. Urine is normal. Another liter of saline is given and are likely clear. Patient states he's feeling better. I counseled patient that she likely is dehydrated and is doing better. She is able apart by mouth here. We'll instruct followup with PCP.   I personally performed the services described in this documentation, which was scribed in my presence. The recorded information has been reviewed and is accurate. I have reviewed all labs and imaging and considered them in my medical decision making.    Osvaldo Shipper, MD 10/05/12 2225

## 2012-10-05 NOTE — ED Notes (Signed)
Patient given discharge instruction, verbalized understand. IV removed, band aid applied. Patient wheelchair out of the department with family

## 2012-10-05 NOTE — ED Notes (Signed)
Pt was able to eat and drink before discharge with out nausea

## 2012-10-25 ENCOUNTER — Inpatient Hospital Stay (HOSPITAL_COMMUNITY)
Admission: EM | Admit: 2012-10-25 | Discharge: 2012-10-29 | DRG: 280 | Disposition: A | Discharge status: Home / Self Care | Payer: Medicaid Other | Attending: Internal Medicine | Admitting: Internal Medicine

## 2012-10-25 ENCOUNTER — Encounter (HOSPITAL_COMMUNITY): Payer: Self-pay

## 2012-10-25 ENCOUNTER — Emergency Department (HOSPITAL_COMMUNITY): Payer: Medicaid Other

## 2012-10-25 DIAGNOSIS — E872 Acidosis, unspecified: Secondary | ICD-10-CM

## 2012-10-25 DIAGNOSIS — E876 Hypokalemia: Secondary | ICD-10-CM

## 2012-10-25 DIAGNOSIS — E1310 Other specified diabetes mellitus with ketoacidosis without coma: Secondary | ICD-10-CM | POA: Diagnosis present

## 2012-10-25 DIAGNOSIS — I214 Non-ST elevation (NSTEMI) myocardial infarction: Principal | ICD-10-CM

## 2012-10-25 DIAGNOSIS — R4182 Altered mental status, unspecified: Secondary | ICD-10-CM

## 2012-10-25 DIAGNOSIS — Z794 Long term (current) use of insulin: Secondary | ICD-10-CM

## 2012-10-25 DIAGNOSIS — E119 Type 2 diabetes mellitus without complications: Secondary | ICD-10-CM

## 2012-10-25 DIAGNOSIS — Z886 Allergy status to analgesic agent status: Secondary | ICD-10-CM

## 2012-10-25 DIAGNOSIS — Z681 Body mass index (BMI) 19 or less, adult: Secondary | ICD-10-CM

## 2012-10-25 DIAGNOSIS — G9341 Metabolic encephalopathy: Secondary | ICD-10-CM

## 2012-10-25 DIAGNOSIS — Z720 Tobacco use: Secondary | ICD-10-CM | POA: Diagnosis present

## 2012-10-25 DIAGNOSIS — A080 Rotaviral enteritis: Secondary | ICD-10-CM | POA: Diagnosis present

## 2012-10-25 DIAGNOSIS — G589 Mononeuropathy, unspecified: Secondary | ICD-10-CM | POA: Diagnosis present

## 2012-10-25 DIAGNOSIS — E861 Hypovolemia: Secondary | ICD-10-CM | POA: Diagnosis present

## 2012-10-25 DIAGNOSIS — IMO0001 Reserved for inherently not codable concepts without codable children: Secondary | ICD-10-CM | POA: Diagnosis present

## 2012-10-25 DIAGNOSIS — R011 Cardiac murmur, unspecified: Secondary | ICD-10-CM

## 2012-10-25 DIAGNOSIS — E1122 Type 2 diabetes mellitus with diabetic chronic kidney disease: Secondary | ICD-10-CM | POA: Diagnosis present

## 2012-10-25 DIAGNOSIS — R509 Fever, unspecified: Secondary | ICD-10-CM

## 2012-10-25 DIAGNOSIS — E860 Dehydration: Secondary | ICD-10-CM

## 2012-10-25 DIAGNOSIS — F17200 Nicotine dependence, unspecified, uncomplicated: Secondary | ICD-10-CM | POA: Diagnosis present

## 2012-10-25 DIAGNOSIS — K219 Gastro-esophageal reflux disease without esophagitis: Secondary | ICD-10-CM | POA: Diagnosis present

## 2012-10-25 DIAGNOSIS — K529 Noninfective gastroenteritis and colitis, unspecified: Secondary | ICD-10-CM

## 2012-10-25 DIAGNOSIS — F10939 Alcohol use, unspecified with withdrawal, unspecified: Secondary | ICD-10-CM | POA: Insufficient documentation

## 2012-10-25 DIAGNOSIS — I10 Essential (primary) hypertension: Secondary | ICD-10-CM | POA: Diagnosis present

## 2012-10-25 DIAGNOSIS — Z87442 Personal history of urinary calculi: Secondary | ICD-10-CM

## 2012-10-25 DIAGNOSIS — E43 Unspecified severe protein-calorie malnutrition: Secondary | ICD-10-CM

## 2012-10-25 DIAGNOSIS — F1010 Alcohol abuse, uncomplicated: Secondary | ICD-10-CM

## 2012-10-25 DIAGNOSIS — F10239 Alcohol dependence with withdrawal, unspecified: Secondary | ICD-10-CM

## 2012-10-25 DIAGNOSIS — I959 Hypotension, unspecified: Secondary | ICD-10-CM

## 2012-10-25 DIAGNOSIS — E1110 Type 2 diabetes mellitus with ketoacidosis without coma: Secondary | ICD-10-CM

## 2012-10-25 LAB — CBC WITH DIFFERENTIAL/PLATELET
Basophils Absolute: 0 10*3/uL (ref 0.0–0.1)
Basophils Relative: 0 % (ref 0–1)
Eosinophils Absolute: 0 10*3/uL (ref 0.0–0.7)
Eosinophils Relative: 0 % (ref 0–5)
MCH: 30.4 pg (ref 26.0–34.0)
MCHC: 35 g/dL (ref 30.0–36.0)
MCV: 86.8 fL (ref 78.0–100.0)
Platelets: 85 10*3/uL — ABNORMAL LOW (ref 150–400)
RDW: 12.4 % (ref 11.5–15.5)
WBC: 11.3 10*3/uL — ABNORMAL HIGH (ref 4.0–10.5)

## 2012-10-25 LAB — BASIC METABOLIC PANEL
CO2: 18 mEq/L — ABNORMAL LOW (ref 19–32)
Calcium: 8.9 mg/dL (ref 8.4–10.5)
Creatinine, Ser: 0.96 mg/dL (ref 0.50–1.10)

## 2012-10-25 LAB — GLUCOSE, CAPILLARY
Glucose-Capillary: 225 mg/dL — ABNORMAL HIGH (ref 70–99)
Glucose-Capillary: 167 mg/dL — ABNORMAL HIGH (ref 70–99)
Glucose-Capillary: 130 mg/dL — ABNORMAL HIGH (ref 70–99)
Glucose-Capillary: 72 mg/dL (ref 70–99)

## 2012-10-25 LAB — POCT I-STAT, CHEM 8
Creatinine, Ser: 1.2 mg/dL — ABNORMAL HIGH (ref 0.50–1.10)
Glucose, Bld: 371 mg/dL — ABNORMAL HIGH (ref 70–99)
Hemoglobin: 10.9 g/dL — ABNORMAL LOW (ref 12.0–15.0)
TCO2: 18 mmol/L (ref 0–100)

## 2012-10-25 LAB — URINALYSIS, ROUTINE W REFLEX MICROSCOPIC
Ketones, ur: NEGATIVE mg/dL
Leukocytes, UA: NEGATIVE
Nitrite: NEGATIVE
Protein, ur: NEGATIVE mg/dL
Urobilinogen, UA: 0.2 mg/dL (ref 0.0–1.0)
pH: 5.5 (ref 5.0–8.0)

## 2012-10-25 LAB — LACTIC ACID, PLASMA: Lactic Acid, Venous: 3.9 mmol/L — ABNORMAL HIGH (ref 0.5–2.2)

## 2012-10-25 LAB — CREATININE, SERUM
Creatinine, Ser: 0.94 mg/dL (ref 0.50–1.10)
GFR calc non Af Amer: 66 mL/min — ABNORMAL LOW (ref >90)

## 2012-10-25 LAB — MAGNESIUM: Magnesium: 1.3 mg/dL — ABNORMAL LOW (ref 1.5–2.5)

## 2012-10-25 MED ORDER — HYDROMORPHONE HCL PF 1 MG/ML IJ SOLN
0.5000 mg | INTRAMUSCULAR | Status: DC | PRN
  Administered 2012-10-26: 0.5 mg via INTRAVENOUS
  Filled 2012-10-25 (×2): qty 1

## 2012-10-25 MED ORDER — LORAZEPAM 2 MG/ML IJ SOLN: 1.0000 mg | Freq: Four times a day (QID) | INTRAMUSCULAR | Status: AC | PRN

## 2012-10-25 MED ORDER — LORAZEPAM 2 MG/ML IJ SOLN
1.0000 mg | Freq: Once | INTRAMUSCULAR | Status: AC
  Administered 2012-10-25: 1 mg via INTRAVENOUS
  Filled 2012-10-25: qty 1

## 2012-10-25 MED ORDER — VITAMIN B-1 100 MG PO TABS
100.0000 mg | ORAL_TABLET | Freq: Every day | ORAL | Status: DC
  Administered 2012-10-26 – 2012-10-29 (×4): 100 mg via ORAL
  Filled 2012-10-25 (×4): qty 1

## 2012-10-25 MED ORDER — SODIUM CHLORIDE 0.9 % IV BOLUS (SEPSIS)
1000.0000 mL | Freq: Once | INTRAVENOUS | Status: AC
  Administered 2012-10-25: 1000 mL via INTRAVENOUS

## 2012-10-25 MED ORDER — FOLIC ACID 1 MG PO TABS: 1.0000 mg | ORAL_TABLET | Freq: Every day | ORAL | Status: DC

## 2012-10-25 MED ORDER — DEXTROSE 50 % IV SOLN: 25.0000 mL | INTRAVENOUS | Status: DC | PRN

## 2012-10-25 MED ORDER — LORAZEPAM 2 MG/ML IJ SOLN: 0.0000 mg | Freq: Four times a day (QID) | INTRAMUSCULAR | Status: AC

## 2012-10-25 MED ORDER — ONDANSETRON HCL 4 MG/2ML IJ SOLN: 4.0000 mg | Freq: Three times a day (TID) | INTRAMUSCULAR | Status: DC | PRN

## 2012-10-25 MED ORDER — DEXTROSE-NACL 5-0.45 % IV SOLN: INTRAVENOUS | Status: DC

## 2012-10-25 MED ORDER — MEGESTROL ACETATE 40 MG PO TABS
ORAL_TABLET | ORAL | Status: AC
  Filled 2012-10-25: qty 1

## 2012-10-25 MED ORDER — SODIUM CHLORIDE 0.9 % IV SOLN
INTRAVENOUS | Status: DC
  Administered 2012-10-25: 10 mL/h via INTRAVENOUS
  Administered 2012-10-26: 20 mL/h via INTRAVENOUS

## 2012-10-25 MED ORDER — ENOXAPARIN SODIUM 30 MG/0.3ML ~~LOC~~ SOLN
30.0000 mg | SUBCUTANEOUS | Status: DC
  Administered 2012-10-25: 30 mg via SUBCUTANEOUS
  Filled 2012-10-25: qty 0.3

## 2012-10-25 MED ORDER — ADULT MULTIVITAMIN W/MINERALS CH
1.0000 | ORAL_TABLET | Freq: Every day | ORAL | Status: DC
  Administered 2012-10-25 – 2012-10-29 (×5): 1 via ORAL
  Filled 2012-10-25 (×5): qty 1

## 2012-10-25 MED ORDER — POTASSIUM CHLORIDE 10 MEQ/100ML IV SOLN
10.0000 meq | Freq: Once | INTRAVENOUS | Status: AC
  Administered 2012-10-25: 10 meq via INTRAVENOUS
  Filled 2012-10-25: qty 100

## 2012-10-25 MED ORDER — SODIUM CHLORIDE 0.9 % IJ SOLN
3.0000 mL | Freq: Two times a day (BID) | INTRAMUSCULAR | Status: DC
  Administered 2012-10-25 – 2012-10-26 (×2): 3 mL via INTRAVENOUS

## 2012-10-25 MED ORDER — FOLIC ACID 1 MG PO TABS
1.0000 mg | ORAL_TABLET | Freq: Every day | ORAL | Status: DC
  Administered 2012-10-25 – 2012-10-29 (×5): 1 mg via ORAL
  Filled 2012-10-25 (×5): qty 1

## 2012-10-25 MED ORDER — SODIUM CHLORIDE 0.9 % IV SOLN
INTRAVENOUS | Status: AC
  Administered 2012-10-25: 100 mL/h via INTRAVENOUS

## 2012-10-25 MED ORDER — PANTOPRAZOLE SODIUM 40 MG PO TBEC
80.0000 mg | DELAYED_RELEASE_TABLET | Freq: Two times a day (BID) | ORAL | Status: DC
  Administered 2012-10-25: 80 mg via ORAL
  Filled 2012-10-25: qty 2

## 2012-10-25 MED ORDER — MEGESTROL ACETATE 40 MG PO TABS
40.0000 mg | ORAL_TABLET | Freq: Two times a day (BID) | ORAL | Status: DC
  Administered 2012-10-25 – 2012-10-29 (×8): 40 mg via ORAL
  Filled 2012-10-25 (×12): qty 1

## 2012-10-25 MED ORDER — KCL IN DEXTROSE-NACL 20-5-0.9 MEQ/L-%-% IV SOLN
INTRAVENOUS | Status: DC
  Administered 2012-10-25 – 2012-10-26 (×2): via INTRAVENOUS

## 2012-10-25 MED ORDER — SODIUM CHLORIDE 0.9 % IV SOLN
INTRAVENOUS | Status: DC
  Administered 2012-10-25: 2.1 [IU]/h via INTRAVENOUS
  Filled 2012-10-25: qty 1

## 2012-10-25 MED ORDER — LORAZEPAM 2 MG/ML IJ SOLN: 0.0000 mg | Freq: Two times a day (BID) | INTRAMUSCULAR | Status: DC

## 2012-10-25 MED ORDER — ONDANSETRON HCL 4 MG/2ML IJ SOLN: 4.0000 mg | INTRAMUSCULAR | Status: DC | PRN

## 2012-10-25 MED ORDER — THIAMINE HCL 100 MG/ML IJ SOLN: 100.0000 mg | Freq: Every day | INTRAMUSCULAR | Status: DC

## 2012-10-25 MED ORDER — POTASSIUM CHLORIDE IN NACL 20-0.9 MEQ/L-% IV SOLN: INTRAVENOUS | Status: DC

## 2012-10-25 MED ORDER — SODIUM CHLORIDE 0.9 % IV SOLN
INTRAVENOUS | Status: DC
  Filled 2012-10-25: qty 1

## 2012-10-25 MED ORDER — THIAMINE HCL 100 MG/ML IJ SOLN
100.0000 mg | Freq: Once | INTRAMUSCULAR | Status: AC
  Administered 2012-10-25: 100 mg via INTRAVENOUS
  Filled 2012-10-25: qty 2

## 2012-10-25 MED ORDER — INSULIN REGULAR BOLUS VIA INFUSION
0.0000 [IU] | Freq: Three times a day (TID) | INTRAVENOUS | Status: DC
  Filled 2012-10-25: qty 10

## 2012-10-25 MED ORDER — LORAZEPAM 1 MG PO TABS
1.0000 mg | ORAL_TABLET | Freq: Four times a day (QID) | ORAL | Status: AC | PRN
  Administered 2012-10-25 – 2012-10-27 (×2): 1 mg via ORAL
  Filled 2012-10-25 (×2): qty 2

## 2012-10-25 MED ORDER — POTASSIUM CHLORIDE 10 MEQ/100ML IV SOLN
10.0000 meq | INTRAVENOUS | Status: AC
  Administered 2012-10-25 – 2012-10-26 (×6): 10 meq via INTRAVENOUS
  Filled 2012-10-25: qty 600

## 2012-10-25 MED ORDER — SODIUM CHLORIDE 0.9 % IV BOLUS (SEPSIS): 1000.0000 mL | Freq: Once | INTRAVENOUS | Status: DC

## 2012-10-25 NOTE — ED Notes (Signed)
Given report to receiving ICU night nurse. ICU staff inquired about waiting to bring pt up after shift change. ED charge consulted and reported okay to wait and bring pt up around [phone].

## 2012-10-25 NOTE — H&P (Signed)
[facility] History and Physical  Erica [name]  [id number]  DOB: 02/19/1955   DOA: 10/25/2012   PCP:   Octavio Graves, DO   Chief Complaint:  Unresponsiveness today  HPI: Erica [name] is a 57 y.o. female.   Middle-aged [place] American lady with a history diabetes type 2, and recurrent admissions for DKA, past history of alcohol abuse, and who lives with her daughter, was brought in by EMS for unresponsiveness and cardiovascular collapse.  Daughter reports that She left patient home alone, while running errands for about an hour on a half, and when she returned the patient was unresponsive and looking deathly ill; EMS reports systolic blood pressure in the 60s on arrival.  In the emergency room she received emergent fluid resuscitation And eventually became more alert. Patient's blood alcohol level was notably 44.  Daughter insists patient has had no alcohol for the past year; the patient says her last alcohol use was in February.  Patient although not fully oriented indicates that she's been having unexplained diarrhea for about 2 weeks, and she has been vomiting today  The patient's mental status is not sufficiently improved that she can give a clear history; for instance she reports a weight loss of 162 pounds in the past month, where his our records indicate that her weight is actually 4 pounds heavier today than admission i 2 months ago  Rewiew of Systems:   All systems negative except as marked bold or noted in the HPI;  Constitutional:    malaise, fever and chills. ;  Eyes:   eye pain, redness and discharge. ;  ENMT:   ear pain, hoarseness, nasal congestion, sinus pressure and sore throat. ;  Cardiovascular:    chest pain, palpitations, diaphoresis, dyspnea and peripheral edema.  Respiratory:   cough, hemoptysis, wheezing and stridor. ;  Gastrointestinal:  nausea, vomiting, diarrhea, constipation, abdominal pain, melena, blood in stool, hematemesis,  jaundice and rectal bleeding. unusual weight loss..  [id number] llbs in 2 months.] Genitourinary:    frequency, dysuria, incontinence,flank pain and hematuria; Musculoskeletal:   back pain and neck pain.  swelling and trauma.;  Skin: .  pruritus, rash, abrasions, bruising and skin lesion.; ulcerations Neuro:    headache, lightheadedness and neck stiffness.  weakness, altered level of consciousness, altered mental status, extremity weakness, burning feet, involuntary movement, seizure and syncope.  Psych:    anxiety, depression, insomnia, tearfulness, panic attacks, hallucinations, paranoia, suicidal or homicidal ideation    Past Medical History  Diagnosis Date  . Chronic diarrhea   . History of kidney stones   . Heart murmur   . Neuropathy     Hx: of  . GERD (gastroesophageal reflux disease)   . Diabetes mellitus     fasting blood sugar 110-120s    Past Surgical History  Procedure Laterality Date  . Colonoscopy  02/24/2010  . Tubal ligation    . Multiple extractions with alveoloplasty N/A 06/13/2012    Procedure: MULTIPLE EXTRACION WITH ALVEOLOPLASTY EXTRACT: 18, 19, 20, 21, 22, 24, 25, 27, 28, 29, 30, 31;  Surgeon: Gae Bon, DDS;  Location: [facility];  Service: Oral Surgery;  Laterality: N/A;    Medications:  HOME MEDS: Prior to Admission medications   Medication Sig Start Date End Date Taking? Authorizing Provider  albuterol (PROAIR HFA) 108 (90 BASE) MCG/ACT inhaler Inhale 2 puffs into the lungs every 6 (six) hours as needed for wheezing or shortness of breath.   Yes Historical Provider, MD  chlordiazePOXIDE (LIBRIUM)  25 MG capsule Take 25 mg by mouth 4 (four) times daily -  before meals and at bedtime.   Yes Historical Provider, MD  disulfiram (ANTABUSE) 250 MG tablet Take 250 mg by mouth daily.   Yes Historical Provider, MD  esomeprazole (NEXIUM) 40 MG capsule Take 40 mg by mouth daily.    Yes Historical Provider, MD  folic acid (FOLVITE) 1 MG tablet Take 1 mg by mouth daily.    Yes Historical Provider, MD  hyoscyamine (LEVSIN, ANASPAZ) 0.125 MG tablet Take 0.125 mg by mouth 4 (four) times daily - after meals and at bedtime.   Yes Historical Provider, MD  insulin aspart (NOVOLOG) 100 UNIT/ML injection Inject 5 Units into the skin 3 (three) times daily before meals.  05/21/12  Yes Delfina Redwood, MD  insulin glargine (LANTUS) 100 UNIT/ML injection Inject 44 Units into the skin at bedtime. 05/21/12  Yes Delfina Redwood, MD  lisinopril (PRINIVIL,ZESTRIL) 10 MG tablet Take 10 mg by mouth daily.   Yes Historical Provider, MD  lubiprostone (AMITIZA) 8 MCG capsule Take 8 mcg by mouth 2 (two) times daily with a meal.   Yes Historical Provider, MD  megestrol (MEGACE) 40 MG tablet Take 40 mg by mouth 2 (two) times daily.   Yes Historical Provider, MD  ondansetron (ZOFRAN-ODT) 4 MG disintegrating tablet Take 4 mg by mouth every 6 (six) hours as needed for nausea.   Yes Historical Provider, MD  oxycodone (ROXICODONE) 30 MG immediate release tablet Take 30 mg by mouth every 8 (eight) hours as needed for pain.    Yes Historical Provider, MD  potassium bicarbonate (K-LYTE) 25 MEQ disintegrating tablet Take 25 mEq by mouth daily.    Yes Historical Provider, MD  traMADol (ULTRAM) 50 MG tablet Take 100 mg by mouth every 6 (six) hours as needed for pain.   Yes Historical Provider, MD  Vitamin D, Ergocalciferol, (DRISDOL) 50000 UNITS CAPS capsule Take 50,000 Units by mouth 2 (two) times a week.   Yes Historical Provider, MD     Allergies:  Allergies  Allergen Reactions  . Aspirin Palpitations    Social History:   reports that she has been smoking Cigarettes.  She has been smoking about 0.00 packs per day. Her smokeless tobacco use includes Snuff. She reports that  drinks alcohol. She reports that she does not use illicit drugs.  Family History: History reviewed. No pertinent family history.   Physical Exam: Filed Vitals:   10/25/12 1812 10/25/12 1900 10/25/12 1940 10/25/12 2027   BP: 165/99 173/94 162/92   Pulse: 126 123 125   Temp:    98.3 F (36.8 C)  TempSrc:    Oral  Resp: 18 22 23    Height:    5' (1.524 m)  Weight:    41.1 kg (90 lb 9.7 oz)  SpO2: 100%      Blood pressure 162/92, pulse 125, temperature 98.3 F (36.8 C), temperature source Oral, resp. rate 23, height 5' (1.524 m), weight 41.1 kg (90 lb 9.7 oz), SpO2 100.00%. Body mass index is 17.7 kg/(m^2).   GEN:  Pleasant but confused and tremulous, extremely thin African American lady  cooperative with exam PSYCH:  alert and oriented person and;   affect is appropriate. HEENT: Mucous membranes pink and anicteric; PERRLA; EOM intact; no cervical lymphadenopathy nor thyromegaly or carotid bruit; no JVD; Breasts:: Not examined CHEST WALL: No tenderness CHEST: Normal respiration, clear to auscultation bilaterally HEART: Tachycardic regular rhythm; no murmurs rubs or gallops BACK:  no CVA tenderness ABDOMEN:  soft non-tender; no masses, no organomegaly, normal abdominal bowel sounds; incontinent green stool stool Rectal Exam: Not done EXTREMITIES: age-appropriate arthropathy of the hands and knees; extensive muscle wasting no edema; no ulcerations. Genitalia: not examined PULSES: 2+ and symmetric SKIN: Normal hydration no rash or ulceration CNS: Cranial nerves 2-12 grossly intact no focal lateralizing neurologic deficit; generalized coarse tremor   Labs on Admission:  Basic Metabolic Panel:  Recent Labs Lab 10/25/12 1556 10/25/12 1600  NA 142 143  K 2.7* 3.1*  CL 106 111  CO2 18*  --   GLUCOSE 371* 371*  BUN 11 12  CREATININE 0.96 1.20*  CALCIUM 8.9  --    Liver Function Tests: No results found for this basename: AST, ALT, ALKPHOS, BILITOT, PROT, ALBUMIN,  in the last 168 hours No results found for this basename: LIPASE, AMYLASE,  in the last 168 hours No results found for this basename: AMMONIA,  in the last 168 hours CBC:  Recent Labs Lab 10/25/12 1556 10/25/12 1600  WBC 11.3*  --    NEUTROABS 8.6*  --   HGB 11.3* 10.9*  HCT 32.3* 32.0*  MCV 86.8  --   PLT 85*  --    Cardiac Enzymes:  Recent Labs Lab 10/25/12 1556  TROPONINI <0.30   BNP: No components found with this basename: POCBNP,  D-dimer: No components found with this basename: D-DIMER,  CBG:  Recent Labs Lab 10/25/12 1625 10/25/12 1716 10/25/12 1814 10/25/12 1928  GLUCAP 306* 272* 225* 167*    Radiological Exams on Admission: Dg Chest Port 1 View  10/25/2012   CLINICAL DATA:  Shortness of Breath.  EXAM: PORTABLE CHEST - 1 VIEW  COMPARISON:  09/24/2012  FINDINGS: Mild peribronchial thickening. Heart is normal size. No confluent airspace opacities. No effusions. No acute bony abnormality.  IMPRESSION: Mild bronchitic changes.   Electronically Signed   By: Rolm Baptise M.D.   On: 10/25/2012 16:23    EKG: Independently reviewed. Sinus tachycardia  Assessment/Plan    Active Problems:   Metabolic encephalopathy   Alcohol abuse   Hypokalemia   Hypotension   Alcohol withdrawal   Metabolic acidosis   Chronic diarrhea   Tobacco abuse   PLAN: Continue vigorous hydration Repletion of potassium; check magnesium and replete as necessary Stool workup including C. Difficile Assume alcohol withdrawal, placed on withdrawal protocol, including intravenous thiamine Continue insulin drip with glucose in IV fluid because of critical stat  Other plans as per orders.  Code Status: Full code Family Communication: No family at bedside; briefly discussed with daughter over the phone Disposition Plan: Depending on hospital course   Hillis Mcphatter Nocturnist [facility] Pager [phone]   10/25/2012, 8:59 PM

## 2012-10-25 NOTE — ED Notes (Signed)
Pt has received 3 Normal saline boluses total and is now receiving two normal saline infusions at 125 ml/hr.Recieving RN in ICU aware.

## 2012-10-25 NOTE — ED Notes (Signed)
Lab called critical K 2.7.  Notified edp

## 2012-10-25 NOTE — ED Notes (Signed)
Pt arrived from home by ems, caregiver reports that pt has been weak and not responding to her today. Pt moans at arrival, bp 0000000 systolic at ems arrival, pt reports cough and that she is tired

## 2012-10-25 NOTE — ED Provider Notes (Signed)
Scribed for Erica Acosta, MD, the patient was seen in room APA18/APA18. This chart was scribed by Erica [name], ED scribe. Patient's care was started at 1513  CSN: [id number]     Arrival date & time 10/25/12  1512 History   None    Chief Complaint  Patient presents with  . Weakness  . Hypotension   (Consider location/radiation/quality/duration/timing/severity/associated sxs/prior Treatment) The history is provided by the patient, the EMS personnel, medical records and a caregiver.  Level 5 caveat applies due to altered mental status.  HPI Comments: Erica [name] is a 57 y.o. female brought in by ambulance, who presents to the Emergency Department with altered mental status onset PTA. Per EMS they were called in for shortness of breath to a private residence by pt's caregiver. Per EMS Systolic BP was in the 0000000, 126 heart rate, and CBG in the 400's on arrival. Reports associated fatigue and subjective fever today. PCP Erica [name]   Past Medical History  Diagnosis Date  . Chronic diarrhea   . History of kidney stones   . Heart murmur   . Neuropathy     Hx: of  . GERD (gastroesophageal reflux disease)   . Diabetes mellitus     fasting blood sugar 110-120s   Past Surgical History  Procedure Laterality Date  . Colonoscopy  02/24/2010  . Tubal ligation    . Multiple extractions with alveoloplasty N/A 06/13/2012    Procedure: MULTIPLE EXTRACION WITH ALVEOLOPLASTY EXTRACT: 18, 19, 20, 21, 22, 24, 25, 27, 28, 29, 30, 31;  Surgeon: Gae Bon, DDS;  Location: [facility];  Service: Oral Surgery;  Laterality: N/A;   No family history on file. History  Substance Use Topics  . Smoking status: Current Every Day Smoker    Types: Cigarettes  . Smokeless tobacco: Current User    Types: Snuff  . Alcohol Use: 0.0 oz/week     Comment: weekly   OB History   Grav Para Term Preterm Abortions TAB SAB Ect Mult Living                 Review of Systems  Unable to perform ROS:  Mental status change   A complete 10 system review of systems was obtained and all systems are negative except as noted in the HPI and PMH.   Allergies  Aspirin  Home Medications   Current Outpatient Rx  Name  Route  Sig  Dispense  Refill  . albuterol (PROAIR HFA) 108 (90 BASE) MCG/ACT inhaler   Inhalation   Inhale 2 puffs into the lungs every 6 (six) hours as needed for wheezing or shortness of breath.         . chlordiazePOXIDE (LIBRIUM) 25 MG capsule   Oral   Take 25 mg by mouth 4 (four) times daily -  before meals and at bedtime.         . cholecalciferol (VITAMIN D) 1000 UNITS tablet   Oral   Take 1,000 Units by mouth every morning.         . Cyanocobalamin (B-12) 100 MCG TABS   Oral   Take 1 tablet by mouth every morning.          Marland Kitchen esomeprazole (NEXIUM) 40 MG capsule   Oral   Take 40 mg by mouth 2 (two) times daily.          . folic acid (RA FOLIC ACID) [id number] MCG tablet   Oral   Take 800 mcg by mouth  every morning.          . insulin aspart (NOVOLOG) 100 UNIT/ML injection   Subcutaneous   Inject 14 Units into the skin 3 (three) times daily before meals.         . insulin glargine (LANTUS) 100 UNIT/ML injection   Subcutaneous   Inject 44 Units into the skin at bedtime.         . ondansetron (ZOFRAN ODT) 4 MG disintegrating tablet      4mg  ODT q4 hours prn nausea/vomit   10 tablet   0   . oxycodone (ROXICODONE) 30 MG immediate release tablet   Oral   Take 30 mg by mouth every 12 (twelve) hours as needed for pain.         . potassium bicarbonate (K-LYTE) 25 MEQ disintegrating tablet   Oral   Take 25 mEq by mouth 3 (three) times daily with meals.          BP 77/48  Pulse 128  Temp(Src) 97.5 F (36.4 C) (Rectal)  Resp 32  SpO2 98% Physical Exam  Nursing note and vitals reviewed. Constitutional: She is oriented to person, place, and time. She appears well-developed and well-nourished. No distress.  HENT:  Head: Normocephalic and  atraumatic.  Mouth/Throat: Mucous membranes are dry.  Chewing tobacco present   Eyes: EOM are normal.  Neck: Neck supple. No tracheal deviation present.  Cardiovascular: Tachycardia present.   Murmur heard.  Systolic murmur is present  Pulmonary/Chest: Effort normal. No respiratory distress.  Abdominal: She exhibits no mass (no pulsating mass). There is no tenderness.  Abdomen is soft. Excessive skin from weight loss   Musculoskeletal: Normal range of motion.  Neurological: She is alert and oriented to person, place, and time.  somnolent, opens eyes, follow commands, weak appearing  MAE x 4   Skin: Skin is warm and dry.  Psychiatric: She has a normal mood and affect. Her behavior is normal.    ED Course  CENTRAL LINE Date/Time: 10/25/2012 3:45 PM Performed by: Noemi Chapel D Authorized by: Noemi Chapel D Consent: Verbal consent obtained. Risks and benefits: risks, benefits and alternatives were discussed Consent given by: patient Patient understanding: patient states understanding of the procedure being performed Patient identity confirmed: verbally with patient and arm band Time out: Immediately prior to procedure a "time out" was called to verify the correct patient, procedure, equipment, support staff and site/side marked as required. Indications: vascular access and central pressure monitoring Anesthesia: local infiltration Local anesthetic: lidocaine 1% without epinephrine Anesthetic total: 3 ml Patient sedated: no Preparation: skin prepped with ChloraPrep Skin prep agent dried: skin prep agent completely dried prior to procedure Hand hygiene: hand hygiene performed prior to central venous catheter insertion Location details: left femoral Site selection rationale: rapid access - very low CVP Patient position: flat Catheter type: triple lumen Pre-procedure: landmarks identified Ultrasound guidance: no Number of attempts: 1 Successful placement: yes Post-procedure:  line sutured Assessment: blood return through all ports and free fluid flow Patient tolerance: Patient tolerated the procedure well with no immediate complications.   (including critical care time) Medications  sodium chloride 0.9 % bolus 1,000 mL (0 mLs Intravenous Stopped 10/25/12 1526)  insulin regular (NOVOLIN R,HUMULIN R) 1 Units/mL in sodium chloride 0.9 % 100 mL infusion (not administered)  LORazepam (ATIVAN) injection 1 mg (not administered)  potassium chloride 10 mEq in 100 mL IVPB (not administered)  sodium chloride 0.9 % bolus 1,000 mL (1,000 mLs Intravenous New Bag/Given 10/25/12 1600)  Labs Review Labs Reviewed  BASIC METABOLIC PANEL - Abnormal; Notable for the following:    Potassium 2.7 (*)    CO2 18 (*)    Glucose, Bld 371 (*)    GFR calc non Af Amer 64 (*)    GFR calc Af Amer 75 (*)    All other components within normal limits  CBC WITH DIFFERENTIAL - Abnormal; Notable for the following:    WBC 11.3 (*)    RBC 3.72 (*)    Hemoglobin 11.3 (*)    HCT 32.3 (*)    Platelets 85 (*)    Neutro Abs 8.6 (*)    All other components within normal limits  LACTIC ACID, PLASMA - Abnormal; Notable for the following:    Lactic Acid, Venous 3.9 (*)    All other components within normal limits  ETHANOL - Abnormal; Notable for the following:    Alcohol, Ethyl (B) 44 (*)    All other components within normal limits  GLUCOSE, CAPILLARY - Abnormal; Notable for the following:    Glucose-Capillary 306 (*)    All other components within normal limits  POCT I-STAT, CHEM 8 - Abnormal; Notable for the following:    Potassium 3.1 (*)    Creatinine, Ser 1.20 (*)    Glucose, Bld 371 (*)    Hemoglobin 10.9 (*)    HCT 32.0 (*)    All other components within normal limits  URINE CULTURE  TROPONIN I   Imaging Review Dg Chest Port 1 View  10/25/2012   CLINICAL DATA:  Shortness of Breath.  EXAM: PORTABLE CHEST - 1 VIEW  COMPARISON:  09/24/2012  FINDINGS: Mild peribronchial thickening.  Heart is normal size. No confluent airspace opacities. No effusions. No acute bony abnormality.  IMPRESSION: Mild bronchitic changes.   Electronically Signed   By: Rolm Baptise M.D.   On: 10/25/2012 16:23    MDM   1. Hypotension   2. Hypokalemia   3. Alcohol withdrawal   4. Altered mental state    Pt appears critically ill with severe persistent hypotnesion - EMS was unable to get access but noted hypotension in the 0000000 systolic, heart rate of [id number].  She is altered but arousable to voice and follows most commands.  Answer to all questions is "some times".  She has clear lungs, no hypoxica and no edema.  She has poor access - I personally placed an 18 gauge angiocath in teh L EJ on arrival for immediate access and fluid rescucitation - she stayed hypotensive after first liter and phlebotomy nor nursing was able to get access / draw blood, thus central line placed by myself.  She continues to be hypotensive.  Angiocath insertion Performed by: Erica [name]  Consent: Verbal consent obtained. Risks and benefits: risks, benefits and alternatives were discussed Time out: Immediately prior to procedure a "time out" was called to verify the correct patient, procedure, equipment, support staff and site/side marked as required.  Preparation: Patient was prepped and draped in the usual sterile fashion.  Vein Location: L EJ  Not Ultrasound Guided  Gauge: 18  Normal blood return and flush without difficulty Patient tolerance: Patient tolerated the procedure well with no immediate complications.  ED ECG REPORT  I personally interpreted this EKG   Date: 10/25/2012   Rate: 128  Rhythm: sinus tachycardia  QRS Axis: normal  Intervals: normal  ST/T Wave abnormalities: nonspecific T wave changes  Conduction Disutrbances:none  Narrative Interpretation:   Old EKG Reviewed: c/w 10/05/12, rate  faster, non spec T wave changes now present  On repeat evaluation the patient remains tachycardic, her blood  pressure has improved with aggressive fluid rehydration. Her alcohol level is detectable and 44, potassium is severely low at 2.7. The patient is critically ill with hypotension, severe dehydration, likely is in alcohol withdrawal as most visits of prior emergency department visits she has had alcohol detectable and has a history of alcohol abuse. It is interesting that her family does not think she has had any alcohol in over one year.  CRITICAL CARE Performed by: Erica [name] Total critical care time: 35 Critical care time was exclusive of separately billable procedures and treating other patients. Critical care was necessary to treat or prevent imminent or life-threatening deterioration. Critical care was time spent personally by me on the following activities: development of treatment plan with patient and/or surrogate as well as nursing, discussions with consultants, evaluation of patient's response to treatment, examination of patient, obtaining history from patient or surrogate, ordering and performing treatments and interventions, ordering and review of laboratory studies, ordering and review of radiographic studies, pulse oximetry and re-evaluation of patient's condition.  Will d/w hospitalist for admission.  -d/w Dr. Anastasio Champion who will admit  I personally performed the services described in this documentation, which was scribed in my presence. The recorded information has been reviewed and is accurate.       Erica Acosta, MD 10/25/12 [phone]

## 2012-10-26 DIAGNOSIS — I369 Nonrheumatic tricuspid valve disorder, unspecified: Secondary | ICD-10-CM

## 2012-10-26 DIAGNOSIS — I214 Non-ST elevation (NSTEMI) myocardial infarction: Secondary | ICD-10-CM | POA: Diagnosis present

## 2012-10-26 DIAGNOSIS — E860 Dehydration: Secondary | ICD-10-CM

## 2012-10-26 DIAGNOSIS — E1110 Type 2 diabetes mellitus with ketoacidosis without coma: Secondary | ICD-10-CM

## 2012-10-26 DIAGNOSIS — R011 Cardiac murmur, unspecified: Secondary | ICD-10-CM

## 2012-10-26 DIAGNOSIS — R509 Fever, unspecified: Secondary | ICD-10-CM | POA: Diagnosis not present

## 2012-10-26 DIAGNOSIS — I959 Hypotension, unspecified: Secondary | ICD-10-CM

## 2012-10-26 DIAGNOSIS — E876 Hypokalemia: Secondary | ICD-10-CM

## 2012-10-26 LAB — GLUCOSE, CAPILLARY
Glucose-Capillary: 113 mg/dL — ABNORMAL HIGH (ref 70–99)
Glucose-Capillary: 144 mg/dL — ABNORMAL HIGH (ref 70–99)
Glucose-Capillary: 152 mg/dL — ABNORMAL HIGH (ref 70–99)
Glucose-Capillary: 165 mg/dL — ABNORMAL HIGH (ref 70–99)
Glucose-Capillary: 181 mg/dL — ABNORMAL HIGH (ref 70–99)
Glucose-Capillary: 183 mg/dL — ABNORMAL HIGH (ref 70–99)

## 2012-10-26 LAB — GI PATHOGEN PANEL BY PCR, STOOL
Campylobacter by PCR: NEGATIVE
E coli (ETEC) LT/ST: NEGATIVE
E coli (STEC): NEGATIVE
Norovirus GI/GII: NEGATIVE

## 2012-10-26 LAB — CBC
MCHC: 34.7 g/dL (ref 30.0–36.0)
Platelets: 203 10*3/uL (ref 150–400)
RDW: 12.3 % (ref 11.5–15.5)

## 2012-10-26 LAB — RAPID URINE DRUG SCREEN, HOSP PERFORMED
Amphetamines: NOT DETECTED
Benzodiazepines: POSITIVE — AB
Cocaine: NOT DETECTED
Opiates: NOT DETECTED

## 2012-10-26 LAB — COMPREHENSIVE METABOLIC PANEL
Albumin: 2.6 g/dL — ABNORMAL LOW (ref 3.5–5.2)
BUN: 6 mg/dL (ref 6–23)
Creatinine, Ser: 0.58 mg/dL (ref 0.50–1.10)
GFR calc Af Amer: >90 mL/min (ref >90)
Total Protein: 5.3 g/dL — ABNORMAL LOW (ref 6.0–8.3)

## 2012-10-26 LAB — HEPARIN LEVEL (UNFRACTIONATED): Heparin Unfractionated: 0.17 IU/mL — ABNORMAL LOW (ref 0.30–0.70)

## 2012-10-26 LAB — TROPONIN I
Troponin I: 0.43 ng/mL (ref <0.30)
Troponin I: 0.46 ng/mL (ref <0.30)

## 2012-10-26 LAB — MRSA PCR SCREENING: MRSA by PCR: NEGATIVE

## 2012-10-26 LAB — HEMOGLOBIN A1C: Mean Plasma Glucose: 226 mg/dL — ABNORMAL HIGH (ref <117)

## 2012-10-26 LAB — LACTIC ACID, PLASMA: Lactic Acid, Venous: 1.1 mmol/L (ref 0.5–2.2)

## 2012-10-26 MED ORDER — MAGNESIUM SULFATE 40 MG/ML IJ SOLN
2.0000 g | Freq: Once | INTRAMUSCULAR | Status: AC
  Administered 2012-10-26: 2 g via INTRAVENOUS
  Filled 2012-10-26: qty 50

## 2012-10-26 MED ORDER — PANTOPRAZOLE SODIUM 40 MG PO TBEC
40.0000 mg | DELAYED_RELEASE_TABLET | Freq: Every day | ORAL | Status: DC
  Administered 2012-10-26 – 2012-10-29 (×4): 40 mg via ORAL
  Filled 2012-10-26 (×4): qty 1

## 2012-10-26 MED ORDER — METOPROLOL TARTRATE 25 MG PO TABS
25.0000 mg | ORAL_TABLET | Freq: Two times a day (BID) | ORAL | Status: DC
  Administered 2012-10-26 (×2): 25 mg via ORAL
  Filled 2012-10-26 (×2): qty 1

## 2012-10-26 MED ORDER — HEPARIN BOLUS VIA INFUSION
2000.0000 [IU] | Freq: Once | INTRAVENOUS | Status: AC
  Administered 2012-10-26: 2000 [IU] via INTRAVENOUS
  Filled 2012-10-26: qty 2000

## 2012-10-26 MED ORDER — INSULIN ASPART 100 UNIT/ML ~~LOC~~ SOLN
0.0000 [IU] | Freq: Three times a day (TID) | SUBCUTANEOUS | Status: DC
  Administered 2012-10-26 – 2012-10-27 (×3): 3 [IU] via SUBCUTANEOUS

## 2012-10-26 MED ORDER — NITROGLYCERIN 2 % TD OINT
1.0000 [in_us] | TOPICAL_OINTMENT | Freq: Three times a day (TID) | TRANSDERMAL | Status: DC
  Administered 2012-10-26 – 2012-10-29 (×10): 1 [in_us] via TOPICAL
  Filled 2012-10-26 (×10): qty 1

## 2012-10-26 MED ORDER — HEPARIN (PORCINE) IN NACL 100-0.45 UNIT/ML-% IJ SOLN
650.0000 [IU]/h | INTRAMUSCULAR | Status: DC
  Administered 2012-10-26: 500 [IU]/h via INTRAVENOUS
  Administered 2012-10-26: 650 [IU]/h via INTRAVENOUS
  Filled 2012-10-26: qty 250

## 2012-10-26 MED ORDER — HEPARIN BOLUS VIA INFUSION
1000.0000 [IU] | Freq: Once | INTRAVENOUS | Status: AC
  Administered 2012-10-26: 1000 [IU] via INTRAVENOUS
  Filled 2012-10-26: qty 1000

## 2012-10-26 MED ORDER — INSULIN GLARGINE 100 UNIT/ML ~~LOC~~ SOLN
10.0000 [IU] | Freq: Every day | SUBCUTANEOUS | Status: DC
  Administered 2012-10-26 – 2012-10-27 (×2): 10 [IU] via SUBCUTANEOUS
  Filled 2012-10-26 (×2): qty 0.1

## 2012-10-26 NOTE — Progress Notes (Signed)
[facility] PROGRESS NOTE  Erica [name] [id number] DOB: 1955/04/06 DOA: 10/25/2012 PCP: Octavio Graves, DO   Brief narrative 57 year old African American female with history of type 2 diabetes with recurrent hospitalization for DKA, alcohol abuse brought in by EMS for unresponsiveness and hypotension. Patient was unresponsive as per EMS with systolic blood pressure in the 60s and alcohol level of 44. He noted to be in mild DKA and and also having unexplained diarrhea for 2 weeks .Patient reported having some chest pain as well and noted for  elevated troponin.    Assessment/Plan: DKA Mild on presentation and now resolved. Off insulin drip and placed on SSI. Check A1C. Doubt compliance with medication and diet. D/c foley  PT eval Diabetic coordinator consulted. Will start on 10 units lantus daily at bedtime.   NSTEMI Possibly due to demand ischemia. Started on heparin drip. patient tachycardic likely with dehydration. continue IV fluids. He is allergic to aspirin. Blood pressure is elevated now and start her on low-dose metoprolol. 2-D echo ordered. Cardiology has been consulted and will follow up with recommendations.  altered mental status SOB improving. Likely in the setting of DKA and dehydration. continue neurochecks. Urine Tox positive for benzos only.  Fever UA unremarkable. Check blood cultures. Given ongoing diarrhea for 2 weeks will check for C. difficile as above. CXR unremarkable. monitor closely  hypokalemia and hypomagnesemia Secondary to DKA and dehydration. replenished  etoh Abuse Patient reports drinking alcohol but is not able to quantify. Placed on CIWA. continue thiamine and folate  Code Status: full code Family Communication: will call daughter and update Disposition Plan: pending   Consultants:  cardiology  Procedures:  none  Antibiotics:  none  HPI/Subjective: Patient seen and examined this morning. He had admission H&P  reviewed. Noted for  elevated troponin.  Objective: Filed Vitals:   10/26/12 1000  BP: 151/83  Pulse: 107  Temp:   Resp: 16    Intake/Output Summary (Last 24 hours) at 10/26/12 1256 Last data filed at 10/26/12 1040  Gross per 24 hour  Intake   2350 ml  Output   3154 ml  Net   -804 ml   Filed Weights   10/25/12 2027  Weight: 41.1 kg (90 lb 9.7 oz)    Exam:   General: Middle aged thin built female lying in bed in no acute distress  HEENT: No pallor, dry oral mucosa  Chest: Clear to auscultation bilaterally, no added sounds  CVS: Normal S1 and S2, no murmurs rub or gallop  Abdomen: Soft, nontender, nondistended, bowel sounds present  Extremities: Warm, no edema  CNS: AAO x2, confused and sleepy, no tremors   Data Reviewed: Basic Metabolic Panel:  Recent Labs Lab 10/25/12 1556 10/25/12 1600 10/26/12 0431  NA 142 143 143  K 2.7* 3.1* 3.8  CL 106 111 114*  CO2 18*  --  22  GLUCOSE 371* 371* 194*  BUN 11 12 6   CREATININE 0.96  0.94 1.20* 0.58  CALCIUM 8.9  --  8.1*  MG 1.3*  --   --    Liver Function Tests:  Recent Labs Lab 10/26/12 0431  AST 30  ALT 74*  ALKPHOS 87  BILITOT 0.6  PROT 5.3*  ALBUMIN 2.6*   No results found for this basename: LIPASE, AMYLASE,  in the last 168 hours No results found for this basename: AMMONIA,  in the last 168 hours CBC:  Recent Labs Lab 10/25/12 1556 10/25/12 1600 10/26/12 0431  WBC 11.3*  --  8.1  NEUTROABS 8.6*  --   --   HGB 11.3* 10.9* 10.3*  HCT 32.3* 32.0* 29.7*  MCV 86.8  --  86.6  PLT 85*  --  203   Cardiac Enzymes:  Recent Labs Lab 10/25/12 1556 10/26/12 0903  TROPONINI <0.30 0.43*   BNP (last 3 results)  Recent Labs  09/24/12 1958  PROBNP 38.8   CBG:  Recent Labs Lab 10/26/12 0610 10/26/12 0655 10/26/12 0747 10/26/12 0825 10/26/12 1138  GLUCAP 152* 142* 116* 98 160*    Recent Results (from the past 240 hour(s))  CULTURE, BLOOD (ROUTINE X 2)     Status: None    Collection Time    10/25/12  3:56 PM      Result Value Range Status   Specimen Description Peripheral   Final   Special Requests Normal   Final   Culture NO GROWTH <24 HRS   Final   Report Status PENDING   Incomplete  MRSA PCR SCREENING     Status: None   Collection Time    10/25/12  8:20 PM      Result Value Range Status   MRSA by PCR NEGATIVE  NEGATIVE Final   Comment:            The GeneXpert MRSA Assay (FDA     approved for NASAL specimens     only), is one component of a     comprehensive MRSA colonization     surveillance program. It is not     intended to diagnose MRSA     infection nor to guide or     monitor treatment for     MRSA infections.     Studies: Dg Chest Port 1 View  10/25/2012   CLINICAL DATA:  Shortness of Breath.  EXAM: PORTABLE CHEST - 1 VIEW  COMPARISON:  09/24/2012  FINDINGS: Mild peribronchial thickening. Heart is normal size. No confluent airspace opacities. No effusions. No acute bony abnormality.  IMPRESSION: Mild bronchitic changes.   Electronically Signed   By: Rolm Baptise M.D.   On: 10/25/2012 16:23    Scheduled Meds: . folic acid  1 mg Oral Daily  . insulin aspart  0-15 Units Subcutaneous TID WC  . LORazepam  0-4 mg Intravenous Q6H   Followed by  . [START ON 10/27/2012] LORazepam  0-4 mg Intravenous Q12H  . megestrol  40 mg Oral BID  . multivitamin with minerals  1 tablet Oral Daily  . nitroGLYCERIN  1 inch Topical Q8H  . pantoprazole  40 mg Oral Daily  . sodium chloride  3 mL Intravenous Q12H  . thiamine  100 mg Oral Daily   Or  . thiamine  100 mg Intravenous Daily   Continuous Infusions: . sodium chloride 20 mL/hr (10/26/12 1128)  . dextrose 5 % and 0.9 % NaCl with KCl 20 mEq/L 150 mL/hr at 10/26/12 1000  . dextrose 5 % and 0.45% NaCl    . heparin 500 Units/hr (10/26/12 1040)      Time spent: 35 minutes    Erica [name]  [facility] Pager [phone]. If 7PM-7AM, please contact night-coverage at [email or web address],  password [facility] 10/26/2012, 12:56 PM  LOS: 1 day

## 2012-10-26 NOTE — Consult Note (Signed)
The patient was seen and examined, and I agree with the assessment and plan as documented above. The patient has no prior known cardiac history but risk factors which include diabetes mellitus and tobacco use. She was admitted with DKA with hypokalemia. Her ECG does not reveal any acute diagnostic ST-T abnormalities indicative of ischemia. Her tachycardia is likely secondary to intravascular volume depletion. I would continue to hydrate her. Given her very mild troponin elevation, which is likely secondary to demand ischemia, I would recommend treating her with ASA 325 mg daily for the time being and consider statin therapy as well, with discontinuation of the heparin infusion. Low-dose Metoprolol has been started as well, and once her volume status improves, her tachycardia may resolve. However, I will review the echocardiographic findings to see if there has been any interval change in her systolic function and to assess for wall motion abnormalities as well. I would recommend outpatient stress testing.

## 2012-10-26 NOTE — Progress Notes (Signed)
Lab called positive rotavirus a  MD notified via text page at [phone]

## 2012-10-26 NOTE — Clinical Social Work Note (Signed)
Pt asleep when CSW entered room. CSW attempted to awaken pt, but unable to do so. Pt opened her eyes and then immediately closed them and went back to sleep. CSW will attempt to assess pt tomorrow.  Erica [name], [place]

## 2012-10-26 NOTE — Consult Note (Signed)
Reason for Consult: positive troponin Referring Physician: PTH  Erica [name] is an 57 y.o. female.  HPI: This is a 57 y.o.African American female with a history diabetes type 2, and recurrent admissions for DKA, past history of alcohol abuse, and who lives with her daughter, was brought in by EMS for unresponsiveness and cardiovascular collapse.  Daughter reports that she left patient home alone, while running errands for about an hour, and when she returned the patient was unresponsive and looking deathly ill; her blood sugar was 309 and she gave her some insulin.EMS reports systolic blood pressure in the 60s on arrival.  In the emergency room she received emergent fluid resuscitation And eventually became more alert. Patient's blood alcohol level was notably 44.Daughter insists patient has had no alcohol for the past year, unless the patient drank while she was gone.  Patient is not aware enough to give a reliable history. She says she has chest pain that is sharp shooting but then says she had chest tightness 2 weeks ago that lasted 2 weeks. Her daughter says patient gets out of breath with little activity.  Initial Troponin was negative, today 0.43, potassium 2.7, but now replaced. Magnesium low. EKG NSR with old septal infarct, unchanged from prior tracings.     Past Medical History  Diagnosis Date  . Chronic diarrhea   . History of kidney stones   . Heart murmur   . Neuropathy     Hx: of  . GERD (gastroesophageal reflux disease)   . Diabetes mellitus     fasting blood sugar 110-120s    Past Surgical History  Procedure Laterality Date  . Colonoscopy  02/24/2010  . Tubal ligation    . Multiple extractions with alveoloplasty N/A 06/13/2012    Procedure: MULTIPLE EXTRACION WITH ALVEOLOPLASTY EXTRACT: 18, 19, 20, 21, 22, 24, 25, 27, 28, 29, 30, 31;  Surgeon: Gae Bon, DDS;  Location: [facility];  Service: Oral Surgery;  Laterality: N/A;    History reviewed. No pertinent  family history.  Social History:  reports that she has been smoking Cigarettes.  She has been smoking about 0.00 packs per day. Her smokeless tobacco use includes Snuff. She reports that  drinks alcohol. She reports that she does not use illicit drugs.  Allergies:  Allergies  Allergen Reactions  . Aspirin Palpitations    Medications:  Scheduled Meds: . folic acid  1 mg Oral Daily  . insulin aspart  0-15 Units Subcutaneous TID WC  . LORazepam  0-4 mg Intravenous Q6H   Followed by  . [START ON 10/27/2012] LORazepam  0-4 mg Intravenous Q12H  . megestrol  40 mg Oral BID  . multivitamin with minerals  1 tablet Oral Daily  . nitroGLYCERIN  1 inch Topical Q8H  . pantoprazole  40 mg Oral Daily  . sodium chloride  3 mL Intravenous Q12H  . thiamine  100 mg Oral Daily   Or  . thiamine  100 mg Intravenous Daily   Continuous Infusions: . sodium chloride 20 mL/hr (10/26/12 1128)  . dextrose 5 % and 0.9 % NaCl with KCl 20 mEq/L 150 mL/hr at 10/26/12 1000  . dextrose 5 % and 0.45% NaCl    . heparin 500 Units/hr (10/26/12 1040)   PRN Meds:.dextrose, HYDROmorphone (DILAUDID) injection, LORazepam, LORazepam, ondansetron (ZOFRAN) IV   Results for orders placed during the hospital encounter of 10/25/12 (from the past 48 hour(s))  BASIC METABOLIC PANEL     Status: Abnormal   Collection Time  10/25/12  3:56 PM      Result Value Range   Sodium 142  135 - 145 mEq/L   Potassium 2.7 (*) 3.5 - 5.1 mEq/L   Comment: CRITICAL RESULT CALLED TO, READ BACK BY AND VERIFIED WITH:     WILSON,A ON 10/25/12 AT 1650 BY LOY,C   Chloride 106  96 - 112 mEq/L   CO2 18 (*) 19 - 32 mEq/L   Glucose, Bld 371 (*) 70 - 99 mg/dL   BUN 11  6 - 23 mg/dL   Creatinine, Ser 0.96  0.50 - 1.10 mg/dL   Calcium 8.9  8.4 - 10.5 mg/dL   GFR calc non Af Amer 64 (*) >90 mL/min   GFR calc Af Amer 75 (*) >90 mL/min   Comment: (NOTE)     The eGFR has been calculated using the CKD EPI equation.     This calculation has not been  validated in all clinical situations.     eGFR's persistently <90 mL/min signify possible Chronic Kidney     Disease.  CBC WITH DIFFERENTIAL     Status: Abnormal   Collection Time    10/25/12  3:56 PM      Result Value Range   WBC 11.3 (*) 4.0 - 10.5 K/uL   RBC 3.72 (*) 3.87 - 5.11 MIL/uL   Hemoglobin 11.3 (*) 12.0 - 15.0 g/dL   HCT 32.3 (*) 36.0 - 46.0 %   MCV 86.8  78.0 - 100.0 fL   MCH 30.4  26.0 - 34.0 pg   MCHC 35.0  30.0 - 36.0 g/dL   RDW 12.4  11.5 - 15.5 %   Platelets 85 (*) 150 - 400 K/uL   Neutrophils Relative % 76  43 - 77 %   Neutro Abs 8.6 (*) 1.7 - 7.7 K/uL   Lymphocytes Relative 15  12 - 46 %   Lymphs Abs 1.7  0.7 - 4.0 K/uL   Monocytes Relative 9  3 - 12 %   Monocytes Absolute 1.0  0.1 - 1.0 K/uL   Eosinophils Relative 0  0 - 5 %   Eosinophils Absolute 0.0  0.0 - 0.7 K/uL   Basophils Relative 0  0 - 1 %   Basophils Absolute 0.0  0.0 - 0.1 K/uL  LACTIC ACID, PLASMA     Status: Abnormal   Collection Time    10/25/12  3:56 PM      Result Value Range   Lactic Acid, Venous 3.9 (*) 0.5 - 2.2 mmol/L  TROPONIN I     Status: None   Collection Time    10/25/12  3:56 PM      Result Value Range   Troponin I <0.30  <0.30 ng/mL   Comment:            Due to the release kinetics of cTnI,     a negative result within the first hours     of the onset of symptoms does not rule out     myocardial infarction with certainty.     If myocardial infarction is still suspected,     repeat the test at appropriate intervals.  ETHANOL     Status: Abnormal   Collection Time    10/25/12  3:56 PM      Result Value Range   Alcohol, Ethyl (B) 44 (*) 0 - 11 mg/dL   Comment:            LOWEST DETECTABLE LIMIT FOR  SERUM ALCOHOL IS 11 mg/dL     FOR MEDICAL PURPOSES ONLY  MAGNESIUM     Status: Abnormal   Collection Time    10/25/12  3:56 PM      Result Value Range   Magnesium 1.3 (*) 1.5 - 2.5 mg/dL  CREATININE, SERUM     Status: Abnormal   Collection Time    10/25/12  3:56 PM       Result Value Range   Creatinine, Ser 0.94  0.50 - 1.10 mg/dL   GFR calc non Af Amer 66 (*) >90 mL/min   GFR calc Af Amer 77 (*) >90 mL/min   Comment: (NOTE)     The eGFR has been calculated using the CKD EPI equation.     This calculation has not been validated in all clinical situations.     eGFR's persistently <90 mL/min signify possible Chronic Kidney     Disease.  CULTURE, BLOOD (ROUTINE X 2)     Status: None   Collection Time    10/25/12  3:56 PM      Result Value Range   Specimen Description Peripheral     Special Requests Normal     Culture NO GROWTH <24 HRS     Report Status PENDING    POCT I-STAT, CHEM 8     Status: Abnormal   Collection Time    10/25/12  4:00 PM      Result Value Range   Sodium 143  135 - 145 mEq/L   Potassium 3.1 (*) 3.5 - 5.1 mEq/L   Chloride 111  96 - 112 mEq/L   BUN 12  6 - 23 mg/dL   Creatinine, Ser 1.20 (*) 0.50 - 1.10 mg/dL   Glucose, Bld 371 (*) 70 - 99 mg/dL   Calcium, Ion 1.15  1.12 - 1.23 mmol/L   TCO2 18  0 - 100 mmol/L   Hemoglobin 10.9 (*) 12.0 - 15.0 g/dL   HCT 32.0 (*) 36.0 - 46.0 %  GLUCOSE, CAPILLARY     Status: Abnormal   Collection Time    10/25/12  4:25 PM      Result Value Range   Glucose-Capillary 306 (*) 70 - 99 mg/dL  GLUCOSE, CAPILLARY     Status: Abnormal   Collection Time    10/25/12  5:16 PM      Result Value Range   Glucose-Capillary 272 (*) 70 - 99 mg/dL  GLUCOSE, CAPILLARY     Status: Abnormal   Collection Time    10/25/12  6:14 PM      Result Value Range   Glucose-Capillary 225 (*) 70 - 99 mg/dL  GLUCOSE, CAPILLARY     Status: Abnormal   Collection Time    10/25/12  7:28 PM      Result Value Range   Glucose-Capillary 167 (*) 70 - 99 mg/dL  MRSA PCR SCREENING     Status: None   Collection Time    10/25/12  8:20 PM      Result Value Range   MRSA by PCR NEGATIVE  NEGATIVE   Comment:            The GeneXpert MRSA Assay (FDA     approved for NASAL specimens     only), is one component of a      comprehensive MRSA colonization     surveillance program. It is not     intended to diagnose MRSA     infection nor to guide or  monitor treatment for     MRSA infections.  GLUCOSE, CAPILLARY     Status: Abnormal   Collection Time    10/25/12  8:49 PM      Result Value Range   Glucose-Capillary 130 (*) 70 - 99 mg/dL   Comment 1 Documented in Chart     Comment 2 Notify RN    GLUCOSE, CAPILLARY     Status: None   Collection Time    10/25/12  9:50 PM      Result Value Range   Glucose-Capillary 72  70 - 99 mg/dL   Comment 1 Documented in Chart     Comment 2 Notify RN    GLUCOSE, CAPILLARY     Status: None   Collection Time    10/25/12 10:46 PM      Result Value Range   Glucose-Capillary 78  70 - 99 mg/dL   Comment 1 Documented in Chart     Comment 2 Notify RN    URINALYSIS, ROUTINE W REFLEX MICROSCOPIC     Status: None   Collection Time    10/25/12 11:15 PM      Result Value Range   Color, Urine YELLOW  YELLOW   APPearance CLEAR  CLEAR   Specific Gravity, Urine 1.010  1.005 - 1.030   pH 5.5  5.0 - 8.0   Glucose, UA NEGATIVE  NEGATIVE mg/dL   Hgb urine dipstick NEGATIVE  NEGATIVE   Bilirubin Urine NEGATIVE  NEGATIVE   Ketones, ur NEGATIVE  NEGATIVE mg/dL   Protein, ur NEGATIVE  NEGATIVE mg/dL   Urobilinogen, UA 0.2  0.0 - 1.0 mg/dL   Nitrite NEGATIVE  NEGATIVE   Leukocytes, UA NEGATIVE  NEGATIVE   Comment: MICROSCOPIC NOT DONE ON URINES WITH NEGATIVE PROTEIN, BLOOD, LEUKOCYTES, NITRITE, OR GLUCOSE <1000 mg/dL.  GLUCOSE, CAPILLARY     Status: Abnormal   Collection Time    10/25/12 11:47 PM      Result Value Range   Glucose-Capillary 127 (*) 70 - 99 mg/dL   Comment 1 Documented in Chart     Comment 2 Notify RN    GLUCOSE, CAPILLARY     Status: Abnormal   Collection Time    10/26/12 12:49 AM      Result Value Range   Glucose-Capillary 144 (*) 70 - 99 mg/dL   Comment 1 Documented in Chart     Comment 2 Notify RN    GLUCOSE, CAPILLARY     Status: Abnormal    Collection Time    10/26/12  2:23 AM      Result Value Range   Glucose-Capillary 165 (*) 70 - 99 mg/dL   Comment 1 Documented in Chart     Comment 2 Notify RN    GLUCOSE, CAPILLARY     Status: Abnormal   Collection Time    10/26/12  3:31 AM      Result Value Range   Glucose-Capillary 183 (*) 70 - 99 mg/dL   Comment 1 Documented in Chart     Comment 2 Notify RN    CBC     Status: Abnormal   Collection Time    10/26/12  4:31 AM      Result Value Range   WBC 8.1  4.0 - 10.5 K/uL   RBC 3.43 (*) 3.87 - 5.11 MIL/uL   Hemoglobin 10.3 (*) 12.0 - 15.0 g/dL   HCT 29.7 (*) 36.0 - 46.0 %   MCV 86.6  78.0 - 100.0 fL  MCH 30.0  26.0 - 34.0 pg   MCHC 34.7  30.0 - 36.0 g/dL   RDW 12.3  11.5 - 15.5 %   Platelets 203  150 - 400 K/uL   Comment: DELTA CHECK NOTED  COMPREHENSIVE METABOLIC PANEL     Status: Abnormal   Collection Time    10/26/12  4:31 AM      Result Value Range   Sodium 143  135 - 145 mEq/L   Potassium 3.8  3.5 - 5.1 mEq/L   Comment: DELTA CHECK NOTED   Chloride 114 (*) 96 - 112 mEq/L   CO2 22  19 - 32 mEq/L   Glucose, Bld 194 (*) 70 - 99 mg/dL   BUN 6  6 - 23 mg/dL   Creatinine, Ser 0.58  0.50 - 1.10 mg/dL   Calcium 8.1 (*) 8.4 - 10.5 mg/dL   Total Protein 5.3 (*) 6.0 - 8.3 g/dL   Albumin 2.6 (*) 3.5 - 5.2 g/dL   AST 30  0 - 37 U/L   ALT 74 (*) 0 - 35 U/L   Alkaline Phosphatase 87  39 - 117 U/L   Total Bilirubin 0.6  0.3 - 1.2 mg/dL   GFR calc non Af Amer >90  >90 mL/min   GFR calc Af Amer >90  >90 mL/min   Comment: (NOTE)     The eGFR has been calculated using the CKD EPI equation.     This calculation has not been validated in all clinical situations.     eGFR's persistently <90 mL/min signify possible Chronic Kidney     Disease.  GLUCOSE, CAPILLARY     Status: Abnormal   Collection Time    10/26/12  4:33 AM      Result Value Range   Glucose-Capillary 181 (*) 70 - 99 mg/dL   Comment 1 Documented in Chart     Comment 2 Notify RN    GLUCOSE, CAPILLARY      Status: Abnormal   Collection Time    10/26/12  6:10 AM      Result Value Range   Glucose-Capillary 152 (*) 70 - 99 mg/dL   Comment 1 Documented in Chart     Comment 2 Notify RN    GLUCOSE, CAPILLARY     Status: Abnormal   Collection Time    10/26/12  6:55 AM      Result Value Range   Glucose-Capillary 142 (*) 70 - 99 mg/dL   Comment 1 Documented in Chart     Comment 2 Notify RN    GLUCOSE, CAPILLARY     Status: Abnormal   Collection Time    10/26/12  7:47 AM      Result Value Range   Glucose-Capillary 116 (*) 70 - 99 mg/dL  GLUCOSE, CAPILLARY     Status: None   Collection Time    10/26/12  8:25 AM      Result Value Range   Glucose-Capillary 98  70 - 99 mg/dL  TROPONIN I     Status: Abnormal   Collection Time    10/26/12  9:03 AM      Result Value Range   Troponin I 0.43 (*) <0.30 ng/mL   Comment: CRITICAL RESULT CALLED TO, READ BACK BY AND VERIFIED WITH:     PHILLIPS,C AT 9:35AM ON 10/26/12 BY FESTERMAN,C                Due to the release kinetics of cTnI,     a  negative result within the first hours     of the onset of symptoms does not rule out     myocardial infarction with certainty.     If myocardial infarction is still suspected,     repeat the test at appropriate intervals.  LACTIC ACID, PLASMA     Status: None   Collection Time    10/26/12 10:07 AM      Result Value Range   Lactic Acid, Venous 1.1  0.5 - 2.2 mmol/L  URINE RAPID DRUG SCREEN (HOSP PERFORMED)     Status: Abnormal   Collection Time    10/26/12 10:48 AM      Result Value Range   Opiates NONE DETECTED  NONE DETECTED   Cocaine NONE DETECTED  NONE DETECTED   Benzodiazepines POSITIVE (*) NONE DETECTED   Amphetamines NONE DETECTED  NONE DETECTED   Tetrahydrocannabinol NONE DETECTED  NONE DETECTED   Barbiturates NONE DETECTED  NONE DETECTED   Comment:            DRUG SCREEN FOR MEDICAL PURPOSES     ONLY.  IF CONFIRMATION IS NEEDED     FOR ANY PURPOSE, NOTIFY LAB     WITHIN 5 DAYS.                 LOWEST DETECTABLE LIMITS     FOR URINE DRUG SCREEN     Drug Class       Cutoff (ng/mL)     Amphetamine      1000     Barbiturate      200     Benzodiazepine   [id number]     Tricyclics       [id number]     Opiates          300     Cocaine          300     THC              50    Dg Chest Port 1 View  10/25/2012   CLINICAL DATA:  Shortness of Breath.  EXAM: PORTABLE CHEST - 1 VIEW  COMPARISON:  09/24/2012  FINDINGS: Mild peribronchial thickening. Heart is normal size. No confluent airspace opacities. No effusions. No acute bony abnormality.  IMPRESSION: Mild bronchitic changes.   Electronically Signed   By: Rolm Baptise M.D.   On: 10/25/2012 16:23    ROS See HPI, patient can't give reliable ROS  Blood pressure 151/83, pulse 107, temperature 100.5 F (38.1 C), temperature source Oral, resp. rate 16, height 5' (1.524 m), weight 90 lb 9.7 oz (41.1 kg), SpO2 100.00%. Physical Exam PHYSICAL EXAM: Thin, in no acute distress. Neck: No JVD, HJR, Bruit, or thyroid enlargement Lungs: No tachypnea, clear without wheezing, rales, or rhonchi Cardiovascular: RRR, PMI not displaced, [id number] systolic murmur LSB, no gallops, bruit, thrill, or heave. Abdomen: BS normal. Soft without organomegaly, masses, lesions or tenderness. Extremities: without cyanosis, clubbing or edema. Good distal pulses bilateral SKin: Warm, no lesions or rashes  Musculoskeletal: No deformities Neuro: no focal signs  2Decho 2010: SUMMARY  -  Overall left ventricular systolic function was vigorous. Left        ventricular ejection fraction was estimated , range being 65        % to 70 %. There was no diagnostic evidence of left        ventricular regional wall motion abnormalities. Left        ventricular wall thickness was mildly  increased. Features        were consistent with mild diastolic dysfunction.  -  The aortic valve was mildly calcified. There was normal aortic        valve leaflet excursion. Transaortic velocity was  minimally        increased - likely related to flow. The mean transaortic        valve gradient was 9 mmHg.  -  There was mild mitral annular calcification. There was mild        mitral valvular regurgitation.  -  The left atrium was mildly dilated.   Assessment/Plan: Metabolic Encephalopathy  NSTEMI probably due to demand ischemia. No EKG changes, hypotensive, hypokalemic,decreased Magnesium on admission. Await 2Decho results to make decision on further cardiac work up. Consider beta blocker with sinus tachycardia and improved BP.  Hypotension-resolved  ETOH  Hypokalemia-replaced  Chronic diarrhea and malnutrition    Ermalinda Barrios 10/26/2012, 11:31 AM

## 2012-10-26 NOTE — Progress Notes (Signed)
ECHOCARDIOGRAM IN PROGRESS 

## 2012-10-26 NOTE — Progress Notes (Addendum)
ANTICOAGULATION CONSULT NOTE -  Pharmacy Consult for Heparin Indication: chest pain/ACS  Allergies  Allergen Reactions  . Aspirin Palpitations    Patient Measurements: Height: 5' (152.4 cm) Weight: 90 lb 9.7 oz (41.1 kg) IBW/kg (Calculated) : 45.5  Vital Signs: Temp: 99.1 F (37.3 C) (09/10 1600) Temp src: Oral (09/10 1600) BP: 154/92 mmHg (09/10 1900) Pulse Rate: 104 (09/10 1600)  Labs:  Recent Labs  10/25/12 1556 10/25/12 1600 10/26/12 0431 10/26/12 0903 10/26/12 1450 10/26/12 1634  HGB 11.3* 10.9* 10.3*  --   --   --   HCT 32.3* 32.0* 29.7*  --   --   --   PLT 85*  --  203  --   --   --   HEPARINUNFRC  --   --   --   --   --  0.17*  CREATININE 0.96  0.94 1.20* 0.58  --   --   --   TROPONINI <0.30  --   --  0.43* 0.46*  --     Estimated Creatinine Clearance: 50.3 ml/min (by C-G formula based on Cr of 0.58).   Medical History: Past Medical History  Diagnosis Date  . Chronic diarrhea   . History of kidney stones   . Heart murmur   . Neuropathy     Hx: of  . GERD (gastroesophageal reflux disease)   . Diabetes mellitus     fasting blood sugar 110-120s    Medications:  Scheduled:  . folic acid  1 mg Oral Daily  . heparin  1,000 Units Intravenous Once  . insulin aspart  0-15 Units Subcutaneous TID WC  . insulin glargine  10 Units Subcutaneous QHS  . LORazepam  0-4 mg Intravenous Q6H   Followed by  . [START ON 10/27/2012] LORazepam  0-4 mg Intravenous Q12H  . megestrol  40 mg Oral BID  . metoprolol tartrate  25 mg Oral BID  . multivitamin with minerals  1 tablet Oral Daily  . nitroGLYCERIN  1 inch Topical Q8H  . pantoprazole  40 mg Oral Daily  . sodium chloride  3 mL Intravenous Q12H  . thiamine  100 mg Oral Daily   Or  . thiamine  100 mg Intravenous Daily    Assessment: 57 yo F admitted with encephalopathy & EtOH intoxication.  Troponin +.  Cardiology consulted & starting heparin until MI ruled out.   She received Lovenox 30mg  sq 2208 at  yesterday. Pltc low on admission but improved today.  No bleeding noted.  Heparin level below goal  Goal of Therapy:  Heparin level 0.3-0.7 units/ml Monitor platelets by anticoagulation protocol: Yes   Plan:  1.  Heparin 1000  units IV bolus then increase heparin infusion to 650  units/hr   2. Heparin level at 3 AM 3.  Daily heparin level & CBC while on heparin   Abner Greenspan, Koleson Reifsteck Bennett 10/26/2012,7:45 PM

## 2012-10-26 NOTE — Progress Notes (Signed)
UR Chart Review Completed  

## 2012-10-26 NOTE — Progress Notes (Signed)
ANTICOAGULATION CONSULT NOTE - Initial Consult  Pharmacy Consult for Heparin Indication: chest pain/ACS  Allergies  Allergen Reactions  . Aspirin Palpitations    Patient Measurements: Height: 5' (152.4 cm) Weight: 90 lb 9.7 oz (41.1 kg) IBW/kg (Calculated) : 45.5  Vital Signs: Temp: 100.5 F (38.1 C) (09/10 0800) Temp src: Oral (09/10 0800) BP: 155/97 mmHg (09/10 0800) Pulse Rate: 117 (09/10 0800)  Labs:  Recent Labs  10/25/12 1556 10/25/12 1600 10/26/12 0431 10/26/12 0903  HGB 11.3* 10.9* 10.3*  --   HCT 32.3* 32.0* 29.7*  --   PLT 85*  --  203  --   CREATININE 0.96  0.94 1.20* 0.58  --   TROPONINI <0.30  --   --  0.43*    Estimated Creatinine Clearance: 50.3 ml/min (by C-G formula based on Cr of 0.58).   Medical History: Past Medical History  Diagnosis Date  . Chronic diarrhea   . History of kidney stones   . Heart murmur   . Neuropathy     Hx: of  . GERD (gastroesophageal reflux disease)   . Diabetes mellitus     fasting blood sugar 110-120s    Medications:  Scheduled:  . folic acid  1 mg Oral Daily  . insulin aspart  0-15 Units Subcutaneous TID WC  . LORazepam  0-4 mg Intravenous Q6H   Followed by  . [START ON 10/27/2012] LORazepam  0-4 mg Intravenous Q12H  . megestrol  40 mg Oral BID  . multivitamin with minerals  1 tablet Oral Daily  . nitroGLYCERIN  1 inch Topical Q8H  . pantoprazole  40 mg Oral Daily  . sodium chloride  3 mL Intravenous Q12H  . thiamine  100 mg Oral Daily   Or  . thiamine  100 mg Intravenous Daily    Assessment: 57 yo F admitted with encephalopathy & EtOH intoxication.  Troponin +.  Cardiology consulted & starting heparin until MI ruled out.   She received Lovenox 30mg  sq 2208 at yesterday. Pltc low on admission but improved today.  No bleeding noted.   Goal of Therapy:  Heparin level 0.3-0.7 units/ml Monitor platelets by anticoagulation protocol: Yes   Plan:  1.  Heparin 2000 units IV bolus then heparin infusion  at 500 units/hr (12 units/kg/hr).  2. Check 6 hour heparin level 3.  Daily heparin level & CBC while on heparin   Ethelean Colla, Lavonia Drafts 10/26/2012,10:24 AM

## 2012-10-26 NOTE — Progress Notes (Signed)
INITIAL NUTRITION ASSESSMENT  DOCUMENTATION CODES Per approved criteria  -Severe malnutrition in the context of chronic illness   Pt meets criteria for severe MALNUTRITION in the context of chronic illness as evidenced by severe body fat and muscle depletion, <75% energy intake x 1 month.  INTERVENTION: Resource Breeze po TID, each supplement provides 250 kcal and 9 grams of protein. Follow-up with diet education when more appropriate.   NUTRITION DIAGNOSIS: Inadequate oral food intake related to poor appetite as evidenced by severe fat and muscle wasting.   Goal: Pt will meet >90% of estimated nutritional needs  Monitor:  Diet advancement, PO intake, labs, weight changes, changes in status  Reason for Assessment: MST=3  57 y.o. female  Admitting Dx: <principal problem not specified>  ASSESSMENT: Pt admitted for AMS. Pt with hx of alcohol abuse. She has a hx of diabetes, with hx of noncompliance with diet. CBGs 98-152. No current Hgb A1c available. Pt last received diet education during previous hospitalization on 05/20/12. Pt was very drowsy at time of visit and no receptive to education at this time.  She reports her appetite has been very poor. No PO intake data available at this time. Noted pt with no teeth. Noted weight maintenance in the past year. Pt reports she was 87#c last week at her PCP appointment. However, noted hx of weight loss more than 1 year ago.   Nutrition Focused Physical Exam:  Subcutaneous Fat:  Orbital Region: severe malnutrition Upper Arm Region: severe malnutrition Thoracic and Lumbar Region: severe malnutrition  Muscle:  Temple Region: severe malnutrition Clavicle Bone Region: severe malnutrition Clavicle and Acromion Bone Region: severe malnutrition Scapular Bone Region: severe malnutrition Dorsal Hand: severe malnutrition Patellar Region: severe malnutrition Anterior Thigh Region: severe malnutrition Posterior Calf Region: severe  malnutrition  Edema: none present   Height: Ht Readings from Last 1 Encounters:  10/25/12 5' (1.524 m)    Weight: Wt Readings from Last 1 Encounters:  10/25/12 90 lb 9.7 oz (41.1 kg)    Ideal Body Weight: 100#  % Ideal Body Weight: 90%  Wt Readings from Last 10 Encounters:  10/25/12 90 lb 9.7 oz (41.1 kg)  10/05/12 80 lb (36.288 kg)  09/24/12 90 lb (40.824 kg)  09/11/12 95 lb 3.8 oz (43.2 kg)  07/03/12 100 lb (45.36 kg)  06/09/12 106 lb 14.4 oz (48.49 kg)  05/20/12 100 lb 15.5 oz (45.8 kg)  05/19/12 90 lb 11.2 oz (41.141 kg)  03/10/12 86 lb (39.009 kg)  07/11/11 113 lb 14.4 oz (51.665 kg)    Usual Body Weight: 113#  % Usual Body Weight: 80%  BMI:  Body mass index is 17.7 kg/(m^2). Meets criteria for underweight.   Estimated Nutritional Needs: Kcal: 1200-1400 kcals daily Protein: 51-62 grams daily Fluid: 1.2-1.4 L daily  Skin: slight abrasions to ankles, dry and flaky feet  Diet Order: Clear Liquid  EDUCATION NEEDS: -Education not appropriate at this time   Intake/Output Summary (Last 24 hours) at 10/26/12 1118 Last data filed at 10/26/12 1040  Gross per 24 hour  Intake   2350 ml  Output   3154 ml  Net   -804 ml    Last BM: 10/26/12  Labs:   Recent Labs Lab 10/25/12 1556 10/25/12 1600 10/26/12 0431  NA 142 143 143  K 2.7* 3.1* 3.8  CL 106 111 114*  CO2 18*  --  22  BUN 11 12 6   CREATININE 0.96  0.94 1.20* 0.58  CALCIUM 8.9  --  8.1*  MG 1.3*  --   --   GLUCOSE 371* 371* 194*    CBG (last 3)   Recent Labs  10/26/12 0655 10/26/12 0747 10/26/12 0825  GLUCAP 142* 116* 98    Scheduled Meds: . folic acid  1 mg Oral Daily  . insulin aspart  0-15 Units Subcutaneous TID WC  . LORazepam  0-4 mg Intravenous Q6H   Followed by  . [START ON 10/27/2012] LORazepam  0-4 mg Intravenous Q12H  . megestrol  40 mg Oral BID  . multivitamin with minerals  1 tablet Oral Daily  . nitroGLYCERIN  1 inch Topical Q8H  . pantoprazole  40 mg Oral Daily   . sodium chloride  3 mL Intravenous Q12H  . thiamine  100 mg Oral Daily   Or  . thiamine  100 mg Intravenous Daily    Continuous Infusions: . sodium chloride 10 mL/hr (10/25/12 2208)  . dextrose 5 % and 0.9 % NaCl with KCl 20 mEq/L 150 mL/hr at 10/26/12 1000  . dextrose 5 % and 0.45% NaCl    . heparin 500 Units/hr (10/26/12 1040)    Past Medical History  Diagnosis Date  . Chronic diarrhea   . History of kidney stones   . Heart murmur   . Neuropathy     Hx: of  . GERD (gastroesophageal reflux disease)   . Diabetes mellitus     fasting blood sugar 110-120s    Past Surgical History  Procedure Laterality Date  . Colonoscopy  02/24/2010  . Tubal ligation    . Multiple extractions with alveoloplasty N/A 06/13/2012    Procedure: MULTIPLE EXTRACION WITH ALVEOLOPLASTY EXTRACT: 18, 19, 20, 21, 22, 24, 25, 27, 28, 29, 30, 31;  Surgeon: Gae Bon, DDS;  Location: [facility];  Service: Oral Surgery;  Laterality: N/A;   Rowene Suto A. Jimmye Norman, RD, LDN Pager: [phone]

## 2012-10-26 NOTE — Progress Notes (Signed)
*  PRELIMINARY RESULTS* Echocardiogram 2D Echocardiogram has been performed.  [place], [place] 10/26/2012, 5:21 PM

## 2012-10-26 NOTE — Evaluation (Signed)
Physical Therapy Evaluation Patient Details Name: Erica [name] MRN: [id number] DOB: 1955/07/29 Today's Date: 10/26/2012 Time: [id number] PT Time Calculation (min): 46 min  PT Assessment / Plan / Recommendation History of Present Illness  Pt with diabetes and a hx of alcoholism is admitted with metabolic encephalopathy.  She is now alert and coherent and able to participate in a PT evaluation.  Clinical Impression   Pt was seen for evaluation.  She is very cooperative with no specific c/o.  Her strength is adequate for functional independence but she is generally very deconditioned with poor muscle bulk.  She states that she does very little activity at home.  She was able to transfer OOB and ambulate with a walker within the parameters of the ICU lines.  Her stability was good and she probably could have ambulated with a cane or no AD but she did not feel ready to do this.  She is most likely very near her baseline functional status.  We will see her for another time or two to increase gait distance and try to progress off of a walker.    PT Assessment  Patient needs continued PT services    Follow Up Recommendations  No PT follow up    Does the patient have the potential to tolerate intense rehabilitation      Barriers to Discharge        Equipment Recommendations  None recommended by PT    Recommendations for Other Services     Frequency Min 2X/week    Precautions / Restrictions Precautions Precautions: None Restrictions Weight Bearing Restrictions: No   Pertinent Vitals/Pain       Mobility  Bed Mobility Bed Mobility: Supine to Sit;Sit to Supine Supine to Sit: 6: Modified independent (Device/Increase time);HOB flat Sit to Supine: 6: Modified independent (Device/Increase time);HOB flat Transfers Transfers: Sit to Stand;Stand to Sit Sit to Stand: 6: Modified independent (Device/Increase time);From bed Stand to Sit: 6: Modified independent (Device/Increase time);To  bed Ambulation/Gait Ambulation/Gait Assistance: 6: Modified independent (Device/Increase time) Ambulation Distance (Feet): 30 Feet Assistive device: Rolling walker Gait Pattern: Within Functional Limits Gait velocity: slow General Gait Details: pt appears quite stable using a walker...we attempted to have her ambulate with a cane or no AD but she did not feel ready to do this. Stairs: No    Exercises     PT Diagnosis: Difficulty walking;Generalized weakness  PT Problem List: Decreased activity tolerance;Decreased mobility PT Treatment Interventions: Gait training;Therapeutic exercise     PT Goals(Current goals can be found in the care plan section) Acute Rehab PT Goals Patient Stated Goal: none stated PT Goal Formulation: With patient Time For Goal Achievement: 11/02/12 Potential to Achieve Goals: Good  Visit Information  Last PT Received On: 10/26/12 History of Present Illness: Pt with diabetes and a hx of alcoholism is admitted with metabolic encephalopathy.  She is now alert and coherent and able to participate in a PT evaluation.       Prior [place] expects to be discharged to:: Private residence Living Arrangements: Children Available Help at Discharge: Family;Available 24 hours/day Type of Home: Apartment Home Access: Level entry Home Layout: One level Home Equipment: Walker - 2 wheels;Cane - single point Prior Function Level of Independence: Independent with assistive device(s) Comments: pt states that sometimes she uses a cane or walker and sometimes doesn't need an assistive device. Communication Communication: No difficulties    Cognition  Cognition Arousal/Alertness: Awake/alert Behavior During Therapy: WFL for tasks assessed/performed  Overall Cognitive Status: Within Functional Limits for tasks assessed    Extremity/Trunk Assessment Upper Extremity Assessment Upper Extremity Assessment: Overall WFL for tasks  assessed Lower Extremity Assessment Lower Extremity Assessment: Overall WFL for tasks assessed Cervical / Trunk Assessment Cervical / Trunk Assessment: Normal   Balance Balance Balance Assessed: No (WNL by functional evaluation)  End of Session PT - End of Session Equipment Utilized During Treatment: Gait belt Activity Tolerance: Patient tolerated treatment well Patient left: in bed;with call bell/phone within reach Nurse Communication: Mobility status  GP     Sable Feil 10/26/2012, 3:55 PM

## 2012-10-26 NOTE — Progress Notes (Signed)
Inpatient Diabetes Program Recommendations  AACE/ADA: New Consensus Statement on Inpatient Glycemic Control (2013)  Target Ranges:  Prepandial:   less than 140 mg/dL      Peak postprandial:   less than 180 mg/dL (1-2 hours)      Critically ill patients:  140 - 180 mg/dL   Results for BISAN, BLIGHT (MRN [id number]) as of 10/26/2012 10:33  Ref. Range 10/25/2012 16:25 10/25/2012 17:16 10/25/2012 18:14 10/25/2012 19:28 10/25/2012 20:49 10/25/2012 21:50 10/25/2012 22:46 10/25/2012 23:47 10/26/2012 00:49 10/26/2012 02:23 10/26/2012 03:31 10/26/2012 04:33 10/26/2012 06:10 10/26/2012 06:55 10/26/2012 07:47 10/26/2012 08:25  Glucose-Capillary Latest Range: 70-99 mg/dL 306 (H) 272 (H) 225 (H) 167 (H) 130 (H) 72 78 127 (H) 144 (H) 165 (H) 183 (H) 181 (H) 152 (H) 142 (H) 116 (H) 98    Inpatient Diabetes Program Recommendations Insulin - Basal: Please consider ordering low dose basal insulin; recommend starting with Lantus 5 or 10 units daily (to start now) and adjust accordingly. Correction (SSI): May want to change frequency of CBGs and Novolog correction to Q4H (patient remains confused and very drowsy).  Note: Patient has a history of diabetes and takes Lantus 44 units QHS and Novolog 5 units TID at home for diabetes management.  Initially patient was placed on an insulin drip and has now been transitioned to SQ insulin.  However, no basal insulin was given prior to insulin drip discontinuation and there is not any basal insulin ordered.  Currently, patient is ordered to receive Novolog 0-15 units AC for inpatient glycemic control.  Went to visit with patient to discuss diabetes and home regimen for diabetes control.  However, patient remains confused and very drowsy.  Please consider ordering low dose basal insulin (recommend starting with Lantus 5 or 10 units daily starting now) and change frequency of CBGs and Novolog correction to Q4H.  Will continue to follow.  Thanks, Barnie Alderman, RN, MSN, CCRN Diabetes  Coordinator Inpatient Diabetes Program [phone]

## 2012-10-27 DIAGNOSIS — R197 Diarrhea, unspecified: Secondary | ICD-10-CM

## 2012-10-27 DIAGNOSIS — E43 Unspecified severe protein-calorie malnutrition: Secondary | ICD-10-CM | POA: Diagnosis present

## 2012-10-27 LAB — BASIC METABOLIC PANEL
CO2: 25 mEq/L (ref 19–32)
Calcium: 8.7 mg/dL (ref 8.4–10.5)
Glucose, Bld: 180 mg/dL — ABNORMAL HIGH (ref 70–99)
Potassium: 2.9 mEq/L — ABNORMAL LOW (ref 3.5–5.1)
Sodium: 136 mEq/L (ref 135–145)

## 2012-10-27 LAB — HEPARIN LEVEL (UNFRACTIONATED): Heparin Unfractionated: 0.41 IU/mL (ref 0.30–0.70)

## 2012-10-27 LAB — CBC
Hemoglobin: 10.3 g/dL — ABNORMAL LOW (ref 12.0–15.0)
MCH: 30.4 pg (ref 26.0–34.0)
Platelets: 185 10*3/uL (ref 150–400)
RBC: 3.39 MIL/uL — ABNORMAL LOW (ref 3.87–5.11)
WBC: 7.1 10*3/uL (ref 4.0–10.5)

## 2012-10-27 LAB — GLUCOSE, CAPILLARY
Glucose-Capillary: 190 mg/dL — ABNORMAL HIGH (ref 70–99)
Glucose-Capillary: 117 mg/dL — ABNORMAL HIGH (ref 70–99)

## 2012-10-27 LAB — LIPID PANEL: Cholesterol: 157 mg/dL (ref 0–200)

## 2012-10-27 LAB — URINE CULTURE

## 2012-10-27 LAB — MAGNESIUM: Magnesium: 1.2 mg/dL — ABNORMAL LOW (ref 1.5–2.5)

## 2012-10-27 MED ORDER — SODIUM CHLORIDE 0.9 % IV SOLN
INTRAVENOUS | Status: DC
  Filled 2012-10-27: qty 1000

## 2012-10-27 MED ORDER — MAGNESIUM OXIDE 400 (241.3 MG) MG PO TABS
400.0000 mg | ORAL_TABLET | Freq: Two times a day (BID) | ORAL | Status: DC
  Administered 2012-10-27 – 2012-10-29 (×4): 400 mg via ORAL
  Filled 2012-10-27 (×4): qty 1

## 2012-10-27 MED ORDER — SODIUM CHLORIDE 0.9 % IJ SOLN: 10.0000 mL | Freq: Two times a day (BID) | INTRAMUSCULAR | Status: DC

## 2012-10-27 MED ORDER — INSULIN ASPART 100 UNIT/ML ~~LOC~~ SOLN
0.0000 [IU] | Freq: Every day | SUBCUTANEOUS | Status: DC
  Administered 2012-10-27: 5 [IU] via SUBCUTANEOUS
  Administered 2012-10-28: 2 [IU] via SUBCUTANEOUS

## 2012-10-27 MED ORDER — POTASSIUM CHLORIDE IN NACL 40-0.9 MEQ/L-% IV SOLN
INTRAVENOUS | Status: DC
  Administered 2012-10-27: 09:00:00 via INTRAVENOUS

## 2012-10-27 MED ORDER — GLUCERNA SHAKE PO LIQD
237.0000 mL | Freq: Three times a day (TID) | ORAL | Status: DC
  Administered 2012-10-27: 14:00:00 via ORAL
  Administered 2012-10-27 – 2012-10-29 (×5): 237 mL via ORAL

## 2012-10-27 MED ORDER — INSULIN ASPART 100 UNIT/ML ~~LOC~~ SOLN
0.0000 [IU] | Freq: Three times a day (TID) | SUBCUTANEOUS | Status: DC
  Administered 2012-10-28: 8 [IU] via SUBCUTANEOUS
  Administered 2012-10-28 – 2012-10-29 (×2): 3 [IU] via SUBCUTANEOUS

## 2012-10-27 MED ORDER — MAGNESIUM SULFATE 40 MG/ML IJ SOLN
2.0000 g | Freq: Once | INTRAMUSCULAR | Status: DC
  Filled 2012-10-27: qty 50

## 2012-10-27 MED ORDER — POTASSIUM CHLORIDE CRYS ER 20 MEQ PO TBCR
40.0000 meq | EXTENDED_RELEASE_TABLET | Freq: Once | ORAL | Status: AC
  Administered 2012-10-27: 40 meq via ORAL
  Filled 2012-10-27: qty 2

## 2012-10-27 MED ORDER — ATORVASTATIN CALCIUM 20 MG PO TABS
20.0000 mg | ORAL_TABLET | Freq: Every day | ORAL | Status: DC
  Administered 2012-10-27: 20 mg via ORAL
  Filled 2012-10-27: qty 1

## 2012-10-27 MED ORDER — POTASSIUM CHLORIDE CRYS ER 20 MEQ PO TBCR
40.0000 meq | EXTENDED_RELEASE_TABLET | Freq: Four times a day (QID) | ORAL | Status: AC
  Administered 2012-10-27 (×2): 40 meq via ORAL
  Filled 2012-10-27 (×2): qty 2

## 2012-10-27 MED ORDER — METOPROLOL TARTRATE 50 MG PO TABS
50.0000 mg | ORAL_TABLET | Freq: Two times a day (BID) | ORAL | Status: DC
  Administered 2012-10-27 – 2012-10-29 (×5): 50 mg via ORAL
  Filled 2012-10-27 (×5): qty 1

## 2012-10-27 NOTE — Progress Notes (Signed)
Nutrition Note  Received MD consult to assess pt nutrition requirements and status. Pt was evaluated yesterday- see RD note dated 10/26/12 for further details.   Noted pt with 4# (4.4%) wt loss since yesterday. Wt loss could be due to fluid loss or scale error.   Pt has been upgraded from a clear liquid diet to a carb modified diet. Intake has improved; PO: 75%. Ordered Resource Breeze TID yesterday, but will d/c and add Glucerna Shake po TID, each supplement provides 220 kcal and 10 grams of protein, due to diet advancement.   Nutrition dx of inadequate oral intake is ongoing. Pt meets criteria for severe malnutrition in the context of chronic illness due to poor po intake, muscle depletion, and fat depletion.   Hgb A1c pending. Education will likely be most effective when pt caregivers are present.   Will continue to follow.   Nasri Boakye A. Jimmye Norman, RD, LDN Pager: [phone]

## 2012-10-27 NOTE — Progress Notes (Signed)
Physical Therapy Treatment Patient Details Name: Erica [name] MRN: [id number] DOB: [birth year] Today's Date: 10/27/2012 Time: [id number] PT Time Calculation (min): 25 min  PT Assessment / Plan / Recommendation  History of Present Illness     PT Comments   Pt is progressing well.  She continues to feel weak but was able to ambulate with a cane for 125' and no instability.  She prefers to use a walker (and uses one at home on occasion).  She is probably very close to her baseline.  Follow Up Recommendations        Does the patient have the potential to tolerate intense rehabilitation     Barriers to Discharge        Equipment Recommendations       Recommendations for Other Services    Frequency     Progress towards PT Goals Progress towards PT goals: Progressing toward goals  Plan Current plan remains appropriate    Precautions / Restrictions Restrictions Weight Bearing Restrictions: No   Pertinent Vitals/Pain     Mobility  Bed Mobility Sit to Supine: 6: Modified independent (Device/Increase time) Transfers Sit to Stand: 6: Modified independent (Device/Increase time) Stand to Sit: 6: Modified independent (Device/Increase time) Ambulation/Gait Ambulation/Gait Assistance: 5: Supervision Ambulation Distance (Feet): 125 Feet Assistive device: Straight cane Ambulation/Gait Assistance Details: pt instructed in keeping head erect Gait Pattern: Within Functional Limits;Narrow base of support Gait velocity: slow General Gait Details: pt moved very tenuously with a cane...she would have preferred a walker but I asked her to use the cane as a strengthening device...she was willing to do this and had no instability with the cane Stairs: No    Exercises General Exercises - Lower Extremity Mini-Sqauts: AROM;5 reps;Standing   PT Diagnosis:    PT Problem List:   PT Treatment Interventions:     PT Goals (current goals can now be found in the care plan section)    Visit  Information  Last PT Received On: 10/27/12    Subjective Data      Cognition       Balance     End of Session PT - End of Session Equipment Utilized During Treatment: Gait belt Activity Tolerance: Patient tolerated treatment well Patient left: in bed;with call bell/phone within reach Nurse Communication: Mobility status   GP     Erica [name] 10/27/2012, 6:01 PM

## 2012-10-27 NOTE — Clinical Social Work Psychosocial (Signed)
Clinical Social Work Department BRIEF PSYCHOSOCIAL ASSESSMENT 10/27/2012  Patient:  FLORMARIA, MOILANEN     Account Number:  [id number]     Admit date:  10/25/2012  Clinical Social Worker:  Wyatt Haste  Date/Time:  10/27/2012 12:00 N  Referred by:  Physician  Date Referred:  10/27/2012 Referred for  Substance Abuse   Other Referral:   Interview type:  Patient Other interview type:    PSYCHOSOCIAL DATA Living Status:  FAMILY Admitted from facility:   Level of care:   Primary support name:  Latasha Primary support relationship to patient:  CHILD, ADULT Degree of support available:   supportive per pt    CURRENT CONCERNS Current Concerns  Substance Abuse   Other Concerns:    SOCIAL WORK ASSESSMENT / PLAN CSW met with pt in ICU. Pt alert and oriented and reports she came to ED because she was feeling "off balance." She lives with her adult daughter and 51 month old grandson. Pt states her daughter is her best support and that she is with pt pretty much around the clock. Pt also has a CAP aid. She could not remember how many hours a day she receives this service, but said it is primarily to help her get to appointments. She indicates she is fairly independent with ADLs.    CSW assessed pt in the spring for ETOH. At that time, she stated she had cut down her drinking to about one beer a couple times a week. When asked about current substance use, pt denies any alcohol or drug use since February. CSW confronted pt on positive blood alcohol levels. She states, "I don't know. Maybe I spilled some alcohol on my clothes and didn't wash them." CSW informed pt that this was not how alcohol got into her system. She continues to stick to this story and states that she does not have a substance use problem. Pt reports no other CSW needs.   Assessment/plan status:  Referral to [employer] Other assessment/ plan:   Information/referral to community resources:   [facility]  resource guide    PATIENT'S/FAMILY'S RESPONSE TO PLAN OF CARE: CSW did not complete SBIRT as pt is clearly not be forthcoming and denies any alcohol at all in over 6 months. CSW will sign off as no other needs reported. [employer] guide left in room.       [name], [place]

## 2012-10-27 NOTE — Progress Notes (Signed)
PT ACCIIDENTLY  DISLODGED IV WHEN BATHED. NUMEROUS ATTEMPTS MADE TO RESTART W/O SUCCESS BY SEVERAL NURSES. DR [facility] NOTIFIED. PICC TEAM UNABLE TO INSERT PICC THIS AFTERNOON. DR [facility] WILL REASSESS NEED FOR IV IN AM.

## 2012-10-27 NOTE — Progress Notes (Signed)
[facility] PROGRESS NOTE  Erica [name] [id number] DOB: [birth year] DOA: 10/25/2012 PCP: Octavio Graves, DO  Brief narrative  57 year old African American female with history of type 2 diabetes with recurrent hospitalization for DKA, alcohol abuse brought in by EMS for unresponsiveness and hypotension. Patient was unresponsive as per EMS with systolic blood pressure in the 60s and alcohol level of 44. He noted to be in mild DKA and and also having unexplained diarrhea for 2 weeks .Patient reported having some chest pain as well and noted for elevated troponin.   Assessment/Plan:  DKA  Mild on presentation and now resolved. Off insulin drip and placed on SSI. Check A1C. Doubt compliance with medication and diet.  D/c foley  PT eval  Diabetic coordinator consulted.   started on 10 units lantus daily at bedtime.   NSTEMI  Possibly due to demand ischemia. Started on heparin drip. patient tachycardic likely with dehydration. continue IV fluids. She  is allergic to aspirin. Blood pressure elevated  and start her on low-dose metoprolol.  2-D echo ordered. Cardiology consulted and think this sis due to demand ischemia. D/c heparin drip. follow echo. Troponin trending down.  altered mental status  SOB improved. Likely in the setting of DKA and dehydration. . Urine Tox positive for benzos only.   Fever  UA unremarkable. pendingblood cultures. Given ongoing diarrhea for 2 weeks will check for C. difficile. CXR unremarkable.  Rotavirus detected on stool studies.  hypokalemia and hypomagnesemia  Secondary to DKA and dehydration. replenished   etoh Abuse  Patient reports drinking alcohol but is not able to quantify. Placed on CIWA. continue thiamine and folate   malnutrition and deconditioning  PT eval and nutrition consult   Code Status: full code  Family Communication: d/w daughter  Disposition Plan: pending  Consultants:  cardiology Procedures:  none Antibiotics:   none     HPI/Subjective: Patient continues to have loose bowel movements. rotavirus noted. Denies any symptoms at this time.  Objective: Filed Vitals:   10/27/12 0800  BP: 151/94  Pulse: 78  Temp:   Resp: 17    Intake/Output Summary (Last 24 hours) at 10/27/12 1220 Last data filed at 10/27/12 1100  Gross per 24 hour  Intake 2037.67 ml  Output   2200 ml  Net -162.33 ml   Filed Weights   10/25/12 2027 10/27/12 0500  Weight: 41.1 kg (90 lb 9.7 oz) 39.1 kg (86 lb 3.2 oz)    Exam:  General: Middle aged thin built female lying in bed in no acute distress  HEENT: No pallor, dry oral mucosa  Chest: Clear to auscultation bilaterally, no added sounds  CVS: Normal S1 and S2, no murmurs rub or gallop  Abdomen: Soft, nontender, nondistended, bowel sounds present Extremities: Warm, no edema  CNS: AAO x3,  no tremors   Data Reviewed: Basic Metabolic Panel:  Recent Labs Lab 10/25/12 1556 10/25/12 1600 10/26/12 0431 10/27/12 0337 10/27/12 0804  NA 142 143 143 136  --   K 2.7* 3.1* 3.8 2.9*  --   CL 106 111 114* 103  --   CO2 18*  --  22 25  --   GLUCOSE 371* 371* 194* 180*  --   BUN 11 12 6  5*  --   CREATININE 0.96  0.94 1.20* 0.58 0.51  --   CALCIUM 8.9  --  8.1* 8.7  --   MG 1.3*  --   --   --  1.2*   Liver Function Tests:  Recent Labs Lab 10/26/12 0431  AST 30  ALT 74*  ALKPHOS 87  BILITOT 0.6  PROT 5.3*  ALBUMIN 2.6*   No results found for this basename: LIPASE, AMYLASE,  in the last 168 hours No results found for this basename: AMMONIA,  in the last 168 hours CBC:  Recent Labs Lab 10/25/12 1556 10/25/12 1600 10/26/12 0431 10/27/12 0337  WBC 11.3*  --  8.1 7.1  NEUTROABS 8.6*  --   --   --   HGB 11.3* 10.9* 10.3* 10.3*  HCT 32.3* 32.0* 29.7* 29.1*  MCV 86.8  --  86.6 85.8  PLT 85*  --  203 185   Cardiac Enzymes:  Recent Labs Lab 10/25/12 1556 10/26/12 0903 10/26/12 1450 10/26/12 2009  TROPONINI <0.30 0.43* 0.46* 0.36*   BNP (last  3 results)  Recent Labs  09/24/12 1958  PROBNP 38.8   CBG:  Recent Labs Lab 10/26/12 1138 10/26/12 1624 10/26/12 2114 10/27/12 0724 10/27/12 1143  GLUCAP 160* 113* 189* 173* 190*    Recent Results (from the past 240 hour(s))  CULTURE, BLOOD (ROUTINE X 2)     Status: None   Collection Time    10/25/12  3:56 PM      Result Value Range Status   Specimen Description BLOOD FEMORAL ARTERY COLLECTED BY DOCTOR   Final   Special Requests     Final   Value: BOTTLES DRAWN AEROBIC AND ANAEROBIC AEB=8CC ANA=6CC   Culture NO GROWTH 1 DAY   Final   Report Status PENDING   Incomplete  URINE CULTURE     Status: None   Collection Time    10/25/12  4:30 PM      Result Value Range Status   Specimen Description URINE, CATHETERIZED   Final   Special Requests NONE   Final   Culture  Setup Time     Final   Value: 10/25/2012 18:40     Performed at [place]     Final   Value: NO GROWTH     Performed at [facility]   Culture     Final   Value: NO GROWTH     Performed at [facility]   Report Status 10/27/2012 FINAL   Final  MRSA PCR SCREENING     Status: None   Collection Time    10/25/12  8:20 PM      Result Value Range Status   MRSA by PCR NEGATIVE  NEGATIVE Final   Comment:            The GeneXpert MRSA Assay (FDA     approved for NASAL specimens     only), is one component of a     comprehensive MRSA colonization     surveillance program. It is not     intended to diagnose MRSA     infection nor to guide or     monitor treatment for     MRSA infections.  CULTURE, BLOOD (ROUTINE X 2)     Status: None   Collection Time    10/26/12 10:07 AM      Result Value Range Status   Specimen Description BLOOD RIGHT HAND   Final   Special Requests BOTTLES DRAWN AEROBIC AND ANAEROBIC 8CC   Final   Culture NO GROWTH 1 DAY   Final   Report Status PENDING   Incomplete  CULTURE, BLOOD (ROUTINE X 2)     Status: None  Collection Time    10/26/12  10:07 AM      Result Value Range Status   Specimen Description BLOOD RIGHT HAND   Final   Special Requests BOTTLES DRAWN AEROBIC AND ANAEROBIC 8CC   Final   Culture NO GROWTH 1 DAY   Final   Report Status PENDING   Incomplete     Studies: Dg Chest Port 1 View  10/25/2012   CLINICAL DATA:  Shortness of Breath.  EXAM: PORTABLE CHEST - 1 VIEW  COMPARISON:  09/24/2012  FINDINGS: Mild peribronchial thickening. Heart is normal size. No confluent airspace opacities. No effusions. No acute bony abnormality.  IMPRESSION: Mild bronchitic changes.   Electronically Signed   By: Rolm Baptise M.D.   On: 10/25/2012 16:23    Scheduled Meds: . atorvastatin  20 mg Oral q1800  . feeding supplement  237 mL Oral TID BM  . folic acid  1 mg Oral Daily  . insulin aspart  0-15 Units Subcutaneous TID WC  . insulin glargine  10 Units Subcutaneous QHS  . LORazepam  0-4 mg Intravenous Q6H   Followed by  . LORazepam  0-4 mg Intravenous Q12H  . megestrol  40 mg Oral BID  . metoprolol tartrate  50 mg Oral BID  . multivitamin with minerals  1 tablet Oral Daily  . nitroGLYCERIN  1 inch Topical Q8H  . pantoprazole  40 mg Oral Daily  . potassium chloride  40 mEq Oral Q6H  . sodium chloride  10-30 mL Intravenous Q12H  . thiamine  100 mg Oral Daily   Or  . thiamine  100 mg Intravenous Daily   Continuous Infusions: . 0.9 % NaCl with KCl 40 mEq / L 75 mL/hr at 10/27/12 1100      Time spent:35 minutes    Kye Silverstein  [facility] Pager [phone]. If 7PM-7AM, please contact night-coverage at [email or web address], password [email or web address] 10/27/2012, 12:20 PM  LOS: 2 days

## 2012-10-27 NOTE — Progress Notes (Signed)
Patient ID: [name], female   DOB: [birth year], 57 y.o.   MRN: [id number]    Primary cardiologist:  Subjective:    Pt reports she is beginning to feel better. Denies any chest pain or SOB, no palpitations.   Objective:   Temp:  [98 F (36.7 C)-99.1 F (37.3 C)] 98.6 F (37 C) (09/11 0400) Pulse Rate:  [89-115] 89 (09/11 0500) Resp:  [8-22] 19 (09/11 0400) BP: (91-179)/(60-114) 157/100 mmHg (09/11 0500) SpO2:  [100 %] 100 % (09/10 1600) Weight:  [86 lb 3.2 oz (39.1 kg)] 86 lb 3.2 oz (39.1 kg) (09/11 0500) Last BM Date: 10/26/12  Filed Weights   10/25/12 2027 10/27/12 0500  Weight: 90 lb 9.7 oz (41.1 kg) 86 lb 3.2 oz (39.1 kg)    Intake/Output Summary (Last 24 hours) at 10/27/12 [id number] Last data filed at 10/27/12 0500  Gross per 24 hour  Intake 2052.67 ml  Output   2650 ml  Net -597.33 ml    Telemetry: sinus rhythm and sinus tach. Alarms listed of VT are sinus tachycardia  Exam:  General:NAD  HEENT:sclera clear  Lungs:Clear anteriorly  Cardiac: RRR, 3/6 systolic murmur RUSB, no JVD, no carotid bruits  Abdomen:soft, NT, ND  Extremities:warm, no edema  Lab Results:  Basic Metabolic Panel:  Recent Labs Lab 10/25/12 1556 10/25/12 1600 10/26/12 0431 10/27/12 0337  NA 142 143 143 136  K 2.7* 3.1* 3.8 2.9*  CL 106 111 114* 103  CO2 18*  --  22 25  GLUCOSE 371* 371* 194* 180*  BUN 11 12 6  5*  CREATININE 0.96  0.94 1.20* 0.58 0.51  CALCIUM 8.9  --  8.1* 8.7  MG 1.3*  --   --   --     Liver Function Tests:  Recent Labs Lab 10/26/12 0431  AST 30  ALT 74*  ALKPHOS 87  BILITOT 0.6  PROT 5.3*  ALBUMIN 2.6*    CBC:  Recent Labs Lab 10/25/12 1556 10/25/12 1600 10/26/12 0431 10/27/12 0337  WBC 11.3*  --  8.1 7.1  HGB 11.3* 10.9* 10.3* 10.3*  HCT 32.3* 32.0* 29.7* 29.1*  MCV 86.8  --  86.6 85.8  PLT 85*  --  203 185    Cardiac Enzymes:  Recent Labs Lab 10/26/12 0903 10/26/12 1450 10/26/12 2009  TROPONINI 0.43* 0.46* 0.36*      BNP:  Recent Labs  09/24/12 1958  PROBNP 38.8    Coagulation: No results found for this basename: INR,  in the last 168 hours  ECG: sinus tach, no acute ischemic changes   EXAM: CXR PORTABLE CHEST - 1 VIEW  COMPARISON: 09/24/2012  FINDINGS:  Mild peribronchial thickening. Heart is normal size. No confluent  airspace opacities. No effusions. No acute bony abnormality.  IMPRESSION:  Mild bronchitic changes.   Medications:   Scheduled Medications: . atorvastatin  20 mg Oral q1800  . folic acid  1 mg Oral Daily  . insulin aspart  0-15 Units Subcutaneous TID WC  . insulin glargine  10 Units Subcutaneous QHS  . LORazepam  0-4 mg Intravenous Q6H   Followed by  . LORazepam  0-4 mg Intravenous Q12H  . megestrol  40 mg Oral BID  . metoprolol tartrate  50 mg Oral BID  . multivitamin with minerals  1 tablet Oral Daily  . nitroGLYCERIN  1 inch Topical Q8H  . pantoprazole  40 mg Oral Daily  . potassium chloride  40 mEq Oral Q6H  . sodium chloride  10-30  mL Intravenous Q12H  . thiamine  100 mg Oral Daily   Or  . thiamine  100 mg Intravenous Daily     Infusions: . 0.9 % NaCl with KCl 40 mEq / L       PRN Medications:  dextrose, HYDROmorphone (DILAUDID) injection, LORazepam, LORazepam, ondansetron (ZOFRAN) IV   Assessment/Plan  1. NSTEMI: likely related to severe hypotension w/ decreased coronary perfusion pressures in setting of severe systemic/metabolic illness(metabolic acidosis, hypovolemia, tachycardia) w/ increased cardiac demands. - Trop peaked at 0.46, now trending down. No evidence on EKG of acute ischemia. BP stabilized, now hypertensive. HR improved in 90s. May experience some hemodynamic lability due to risk of EtoH withdrawal, continue benzos as needed. - f/u echo results - will follow, consider possible outpatient stress test - ASA allergy listed, continue statin          Carlyle Dolly MD

## 2012-10-28 LAB — BASIC METABOLIC PANEL
GFR calc Af Amer: >90 mL/min (ref >90)
GFR calc non Af Amer: >90 mL/min (ref >90)
Potassium: 4.8 mEq/L (ref 3.5–5.1)
Sodium: 136 mEq/L (ref 135–145)

## 2012-10-28 LAB — GLUCOSE, CAPILLARY
Glucose-Capillary: 252 mg/dL — ABNORMAL HIGH (ref 70–99)
Glucose-Capillary: 174 mg/dL — ABNORMAL HIGH (ref 70–99)
Glucose-Capillary: 108 mg/dL — ABNORMAL HIGH (ref 70–99)
Glucose-Capillary: 217 mg/dL — ABNORMAL HIGH (ref 70–99)

## 2012-10-28 MED ORDER — INSULIN ASPART 100 UNIT/ML ~~LOC~~ SOLN
3.0000 [IU] | Freq: Three times a day (TID) | SUBCUTANEOUS | Status: DC
  Administered 2012-10-28 – 2012-10-29 (×4): 3 [IU] via SUBCUTANEOUS

## 2012-10-28 MED ORDER — LISINOPRIL 10 MG PO TABS
10.0000 mg | ORAL_TABLET | Freq: Every day | ORAL | Status: DC
  Administered 2012-10-28 – 2012-10-29 (×2): 10 mg via ORAL
  Filled 2012-10-28 (×2): qty 1

## 2012-10-28 MED ORDER — INSULIN GLARGINE 100 UNIT/ML ~~LOC~~ SOLN
18.0000 [IU] | Freq: Every day | SUBCUTANEOUS | Status: DC
  Administered 2012-10-28: 18 [IU] via SUBCUTANEOUS
  Filled 2012-10-28 (×3): qty 0.18

## 2012-10-28 MED ORDER — ATORVASTATIN CALCIUM 40 MG PO TABS
40.0000 mg | ORAL_TABLET | Freq: Every day | ORAL | Status: DC
  Administered 2012-10-28: 40 mg via ORAL
  Filled 2012-10-28: qty 1

## 2012-10-28 NOTE — Progress Notes (Signed)
Bed alarm failed when patient stood up. Chair alarm now positioned under patient in bed. Patient's HS CBG 397, MD notified and order received for SS HS coverage. Orders followed.

## 2012-10-28 NOTE — Progress Notes (Signed)
Spoke with pt about diabetes and home regimen for diabetes control .  Patient, her daughter, and her sister were present during the conversation and the patient gave permission for family to be present during conversation.  Discussed A1C results (9.5% on 10/25/2012) and explained what an A1C is, basic pathophysiology of DM Type 2, basic home care, importance of checking CBGs and maintaining good CBG control to prevent long-term and short-term complications.  Patient and her daughter report that she takes her insulin as prescribed which is Lantus 44 units QHS and Novolog 10-14 units (sliding scale) TID with meals.  Patient see Dr. Melina Copa who helps manage her diabetes.  According to the patient's daughter, the patient was placed on current doses of Lantus and Novolog at time of last hospital discharge on 09/11/2012 and Dr. Melina Copa has not made any changes in the doses of insulin she was discharged on.  According to the patient and her daughter, she sees Dr. Melina Copa every week.  Inquired about interest in seeing an endocrinologist and patient states she would be willing to see an endocrinologist to get better blood sugar control.  Provided patient with business card for Dr. Dorris Fetch and encouraged her to give him a call.  Feel patient would benefit from working with an endocrinologist for diabetes control, especially since patient has been seen in the ED 6 times since January 2014 with noted hyperglycemia.  Patient and her family verbalized understanding of information discussed and state they have no questions related to diabetes at this time.  Will continue to follow while inpatient.  Thanks, Barnie Alderman, RN, MSN, CCRN Diabetes Coordinator Inpatient Diabetes Program [phone] (Team Pager) [phone] ([facility] office) [phone] [facility] office)

## 2012-10-28 NOTE — Progress Notes (Signed)
[facility] PROGRESS NOTE  CORDILIA RHEAULT [id number] DOB: 08-Aug-1955 DOA: 10/25/2012 PCP: Octavio Graves, DO  Brief narrative  57 year old African American female with history of type 2 diabetes with recurrent hospitalization for DKA, alcohol abuse brought in by EMS for unresponsiveness and hypotension. Patient was unresponsive as per EMS with systolic blood pressure in the 60s and alcohol level of 44. He noted to be in mild DKA and and also having unexplained diarrhea for 2 weeks .Patient reported having some chest pain as well and noted for elevated troponin.   Assessment/Plan:  DKA  Mild on presentation and now resolved. Off insulin drip and placed on SSI. Check A1C. Doubt compliance with medication and diet.  Diabetic coordinator consulted.  Increased lantus dose to 18 unuts. Added premeal novolog.  NSTEMI  Possibly due to demand ischemia. Started on heparin drip.   She is allergic to aspirin. Blood pressure elevated and start her on low-dose metoprolol. Dose increased. 2-D echo shows normal EF with grade 1 diastolic dysfn.. Cardiology consulted and think this is due to demand ischemia. off  heparin drip. LDL of 76. Placed on lipitor. Follow up with cards as outpt   altered mental status  Resolved . Likely in the setting of DKA and dehydration. . Urine Tox positive for benzos only.   Fever  UA unremarkable. blood cultures negative. diarrhea resolved as well . CXR unremarkable. Now afebrile Rotavirus detected on stool studies.   hypokalemia and hypomagnesemia  Secondary to DKA and dehydration. replenished   etoh Abuse  Patient reports drinking alcohol but is not able to quantify. Placed on CIWA. continue thiamine and folate . No signs of withdrawal.  malnutrition and deconditioning  PT eval and nutrition consult   Code Status: full code  Family Communication: d/w daughter   Disposition Plan: Tx out of ICU to medical floor. :home with [facility] tomorrow  Consultants:   Cardiology   Procedures:  None   Antibiotics:  none   HPI/Subjective: Elevated fsg overnight. No other issues. Feels better  Objective: Filed Vitals:   10/28/12 0730  BP:   Pulse:   Temp: 98.8 F (37.1 C)  Resp:     Intake/Output Summary (Last 24 hours) at 10/28/12 1044 Last data filed at 10/28/12 0918  Gross per 24 hour  Intake   1140 ml  Output   1200 ml  Net    -60 ml   Filed Weights   10/25/12 2027 10/27/12 0500 10/28/12 0500  Weight: 41.1 kg (90 lb 9.7 oz) 39.1 kg (86 lb 3.2 oz) 44.8 kg (98 lb 12.3 oz)    Exam: General: Middle aged thin built female lying in bed in no acute distress  HEENT: No pallor, dry oral mucosa  Chest: Clear to auscultation bilaterally, no added sounds  CVS: Normal S1 and S2, no murmurs rub or gallop  Abdomen: Soft, nontender, nondistended, bowel sounds present Extremities: Warm, no edema  CNS: AAO x3, no tremors   Data Reviewed: Basic Metabolic Panel:  Recent Labs Lab 10/25/12 1556 10/25/12 1600 10/26/12 0431 10/27/12 0337 10/27/12 0804 10/28/12 0425  NA 142 143 143 136  --  136  K 2.7* 3.1* 3.8 2.9*  --  4.8  CL 106 111 114* 103  --  104  CO2 18*  --  22 25  --  23  GLUCOSE 371* 371* 194* 180*  --  269*  BUN 11 12 6  5*  --  7  CREATININE 0.96  0.94 1.20* 0.58 0.51  --  0.55  CALCIUM 8.9  --  8.1* 8.7  --  9.5  MG 1.3*  --   --   --  1.2* 1.4*   Liver Function Tests:  Recent Labs Lab 10/26/12 0431  AST 30  ALT 74*  ALKPHOS 87  BILITOT 0.6  PROT 5.3*  ALBUMIN 2.6*   No results found for this basename: LIPASE, AMYLASE,  in the last 168 hours No results found for this basename: AMMONIA,  in the last 168 hours CBC:  Recent Labs Lab 10/25/12 1556 10/25/12 1600 10/26/12 0431 10/27/12 0337  WBC 11.3*  --  8.1 7.1  NEUTROABS 8.6*  --   --   --   HGB 11.3* 10.9* 10.3* 10.3*  HCT 32.3* 32.0* 29.7* 29.1*  MCV 86.8  --  86.6 85.8  PLT 85*  --  203 185   Cardiac Enzymes:  Recent Labs Lab  10/25/12 1556 10/26/12 0903 10/26/12 1450 10/26/12 2009  TROPONINI <0.30 0.43* 0.46* 0.36*   BNP (last 3 results)  Recent Labs  09/24/12 1958  PROBNP 38.8   CBG:  Recent Labs Lab 10/27/12 0724 10/27/12 1143 10/27/12 1620 10/27/12 2110 10/28/12 0728  GLUCAP 173* 190* 117* 397* 252*    Recent Results (from the past 240 hour(s))  CULTURE, BLOOD (ROUTINE X 2)     Status: None   Collection Time    10/25/12  3:56 PM      Result Value Range Status   Specimen Description BLOOD FEMORAL ARTERY COLLECTED BY DOCTOR   Final   Special Requests     Final   Value: BOTTLES DRAWN AEROBIC AND ANAEROBIC AEB=8CC ANA=6CC   Culture NO GROWTH 2 DAYS   Final   Report Status PENDING   Incomplete  URINE CULTURE     Status: None   Collection Time    10/25/12  4:30 PM      Result Value Range Status   Specimen Description URINE, CATHETERIZED   Final   Special Requests NONE   Final   Culture  Setup Time     Final   Value: 10/25/2012 18:40     Performed at [place]     Final   Value: NO GROWTH     Performed at [facility]   Culture     Final   Value: NO GROWTH     Performed at [facility]   Report Status 10/27/2012 FINAL   Final  MRSA PCR SCREENING     Status: None   Collection Time    10/25/12  8:20 PM      Result Value Range Status   MRSA by PCR NEGATIVE  NEGATIVE Final   Comment:            The GeneXpert MRSA Assay (FDA     approved for NASAL specimens     only), is one component of a     comprehensive MRSA colonization     surveillance program. It is not     intended to diagnose MRSA     infection nor to guide or     monitor treatment for     MRSA infections.  CULTURE, BLOOD (ROUTINE X 2)     Status: None   Collection Time    10/26/12 10:07 AM      Result Value Range Status   Specimen Description BLOOD RIGHT HAND   Final   Special Requests BOTTLES DRAWN AEROBIC AND ANAEROBIC 8CC  Final   Culture NO GROWTH 2 DAYS   Final    Report Status PENDING   Incomplete  CULTURE, BLOOD (ROUTINE X 2)     Status: None   Collection Time    10/26/12 10:07 AM      Result Value Range Status   Specimen Description BLOOD RIGHT HAND   Final   Special Requests BOTTLES DRAWN AEROBIC AND ANAEROBIC 8CC   Final   Culture NO GROWTH 2 DAYS   Final   Report Status PENDING   Incomplete     Studies: No results found.  Scheduled Meds: . atorvastatin  40 mg Oral q1800  . feeding supplement  237 mL Oral TID BM  . folic acid  1 mg Oral Daily  . insulin aspart  0-15 Units Subcutaneous TID WC  . insulin aspart  0-5 Units Subcutaneous QHS  . insulin aspart  3 Units Subcutaneous TID WC  . insulin glargine  18 Units Subcutaneous QHS  . lisinopril  10 mg Oral Daily  . LORazepam  0-4 mg Intravenous Q12H  . magnesium oxide  400 mg Oral BID  . magnesium sulfate 1 - 4 g bolus IVPB  2 g Intravenous Once  . megestrol  40 mg Oral BID  . metoprolol tartrate  50 mg Oral BID  . multivitamin with minerals  1 tablet Oral Daily  . nitroGLYCERIN  1 inch Topical Q8H  . pantoprazole  40 mg Oral Daily  . sodium chloride  10-30 mL Intravenous Q12H  . thiamine  100 mg Oral Daily   Or  . thiamine  100 mg Intravenous Daily   Continuous Infusions:     Time spent: 25 minutes    Eisa Conaway  [facility] Pager [phone]. If 7PM-7AM, please contact night-coverage at [email or web address], password [facility] 10/28/2012, 10:44 AM  LOS: 3 days

## 2012-10-28 NOTE — Care Management Note (Unsigned)
    Page 1 of 1   10/28/2012     3:32:23 PM   CARE MANAGEMENT NOTE 10/28/2012  Patient:  Erica [name], Erica [name]   Account Number:  [id number]  Date Initiated:  10/28/2012  Documentation initiated by:  Vladimir Creeks  Subjective/Objective Assessment:   Admitted with AMS, unresponsiveness,hypokalemia, hypotension. Pt is from home with family and has a CAP aide, with [facility] RN monthly, but pt does not remember the agencies names     Action/Plan:   Will speak with family concerning agencies for [facility]   Anticipated DC Date:  10/29/2012   Anticipated DC Plan:  [place]  CM consult      Choice offered to / List presented to:             Status of service:  In process, will continue to follow Medicare Important Message given?   (If response is "NO", the following Medicare IM given date fields will be blank) Date Medicare IM given:   Date Additional Medicare IM given:    Discharge Disposition:    Per UR Regulation:  Reviewed for med. necessity/level of care/duration of stay  If discussed at [place] of Stay Meetings, dates discussed:    Comments:  10/28/12 [place] RN/CM Patient's daughter, [name] asked about increasing aide from 5h/d-5d week and 4h/d weekends, and that aide is from [facility]. Spoke with daughter with patient's permission,  and she says she just wants to know if MD thinks pt needs increased hours due to pt became ill after aide left. Explained that pt could become ill after aide leaves no matter how long aide stays, unless someone is with pt 24h a day. Also told her that she can ask Case manager with [facility] about increasing hours of the aide at any time that they feel this is needed, and they will assess for the need for this increase. Also discussed need for [facility], but pt has aide, CM from [facility] and RN from [facility], and children, so daughter does not feel they need anyone else to come to check on her. 10/28/12 1200  Cong Hightower RN/CM Pt is + for a NSTEMI. She wants to go home at D/C - will follow for needs. May need rehab, but she does not want this

## 2012-10-28 NOTE — Progress Notes (Addendum)
Primary cardiologist:  Subjective:    Pt has no complaints this morning  Objective:   Temp:  [97.1 F (36.2 C)-99.6 F (37.6 C)] 97.1 F (36.2 C) (09/12 0400) Pulse Rate:  [78] 78 (09/11 0800) Resp:  [11-22] 17 (09/12 0600) BP: (131-165)/(80-99) 164/99 mmHg (09/12 0500) Weight:  [98 lb 12.3 oz (44.8 kg)] 98 lb 12.3 oz (44.8 kg) (09/12 0500) Last BM Date: 10/26/12  Filed Weights   10/25/12 2027 10/27/12 0500 10/28/12 0500  Weight: 90 lb 9.7 oz (41.1 kg) 86 lb 3.2 oz (39.1 kg) 98 lb 12.3 oz (44.8 kg)    Intake/Output Summary (Last 24 hours) at 10/28/12 0733 Last data filed at 10/28/12 0145  Gross per 24 hour  Intake   1790 ml  Output   1700 ml  Net     90 ml    Telemetry: sinus tachycardia  Exam:  General:NAD  HEENT:sclera clear, pupils equal round and reactive to light  Lungs:CTAB  Cardiac:RRR, faint systolic murmur at apex, no JVD, no carotid bruits  Abdomen: Soft, NT, ND  Extremities: warm, no edema  Lab Results:  Basic Metabolic Panel:  Recent Labs Lab 10/25/12 1556  10/26/12 0431 10/27/12 0337 10/27/12 0804 10/28/12 0425  NA 142  < > 143 136  --  136  K 2.7*  < > 3.8 2.9*  --  4.8  CL 106  < > 114* 103  --  104  CO2 18*  --  22 25  --  23  GLUCOSE 371*  < > 194* 180*  --  269*  BUN 11  < > 6 5*  --  7  CREATININE 0.96  0.94  < > 0.58 0.51  --  0.55  CALCIUM 8.9  --  8.1* 8.7  --  9.5  MG 1.3*  --   --   --  1.2* 1.4*  < > = values in this interval not displayed.  Liver Function Tests:  Recent Labs Lab 10/26/12 0431  AST 30  ALT 74*  ALKPHOS 87  BILITOT 0.6  PROT 5.3*  ALBUMIN 2.6*    CBC:  Recent Labs Lab 10/25/12 1556 10/25/12 1600 10/26/12 0431 10/27/12 0337  WBC 11.3*  --  8.1 7.1  HGB 11.3* 10.9* 10.3* 10.3*  HCT 32.3* 32.0* 29.7* 29.1*  MCV 86.8  --  86.6 85.8  PLT 85*  --  203 185    Cardiac Enzymes:  Recent Labs Lab 10/26/12 0903 10/26/12 1450 10/26/12 2009  TROPONINI 0.43* 0.46* 0.36*     BNP:  Recent Labs  09/24/12 1958  PROBNP 38.8    Coagulation: No results found for this basename: INR,  in the last 168 hours   Echo: normal LVEF, no focal wall motion abnormalities, moderate LVH  Medications:   Scheduled Medications: . atorvastatin  20 mg Oral q1800  . feeding supplement  237 mL Oral TID BM  . folic acid  1 mg Oral Daily  . insulin aspart  0-15 Units Subcutaneous TID WC  . insulin aspart  0-5 Units Subcutaneous QHS  . insulin glargine  10 Units Subcutaneous QHS  . LORazepam  0-4 mg Intravenous Q12H  . magnesium oxide  400 mg Oral BID  . magnesium sulfate 1 - 4 g bolus IVPB  2 g Intravenous Once  . megestrol  40 mg Oral BID  . metoprolol tartrate  50 mg Oral BID  . multivitamin with minerals  1 tablet Oral Daily  . nitroGLYCERIN  1  inch Topical Q8H  . pantoprazole  40 mg Oral Daily  . sodium chloride  10-30 mL Intravenous Q12H  . thiamine  100 mg Oral Daily   Or  . thiamine  100 mg Intravenous Daily     Infusions: . 0.9 % NaCl with KCl 40 mEq / L 75 mL/hr at 10/27/12 1400     PRN Medications:  dextrose, HYDROmorphone (DILAUDID) injection, LORazepam, LORazepam, ondansetron (ZOFRAN) IV   Assessment/Plan  1. Demand ischemia: transient troponin elevation in the setting of severe hypotension, metabolic acidosis, and tachycardia. Echo shows normal LVEF at baseline, no focal wall motion abnormalities. No specific evidence of ischemia on EKGs.  - continue primary CAD prevention and risk factor modification - recommend changing to atorva 40mg  consistent w/ new lipid guidelines, she is a diabetic patient w/ at least moderate CAD risk. - recommend resuming her home ACE-I give her DM history, titrate as neccesary to better bp control - she may follow up with me in clinic in 2 weeks, at this time do not recommend any further cardiac work or interventions. Will sign off, please contact with any questions. Will add addendum w/ appt date and time.     :        Carlyle Dolly, M.D., F.A.C.C.    Addendum: Clinic appointment with Dr. Kathe [facility] 11/10/12 at 1000AM

## 2012-10-29 DIAGNOSIS — IMO0001 Reserved for inherently not codable concepts without codable children: Secondary | ICD-10-CM | POA: Diagnosis present

## 2012-10-29 DIAGNOSIS — E1310 Other specified diabetes mellitus with ketoacidosis without coma: Secondary | ICD-10-CM

## 2012-10-29 DIAGNOSIS — E1122 Type 2 diabetes mellitus with diabetic chronic kidney disease: Secondary | ICD-10-CM | POA: Diagnosis present

## 2012-10-29 DIAGNOSIS — A080 Rotaviral enteritis: Secondary | ICD-10-CM | POA: Diagnosis present

## 2012-10-29 LAB — GLUCOSE, CAPILLARY

## 2012-10-29 MED ORDER — ATORVASTATIN CALCIUM 40 MG PO TABS: 40.0000 mg | ORAL_TABLET | Freq: Every day | ORAL | Status: DC

## 2012-10-29 MED ORDER — FREESTYLE SYSTEM KIT: 1.0000 | PACK | Status: DC | PRN

## 2012-10-29 MED ORDER — INSULIN ASPART 100 UNIT/ML FLEXPEN: 5.0000 [IU] | PEN_INJECTOR | Freq: Three times a day (TID) | SUBCUTANEOUS | Status: DC

## 2012-10-29 MED ORDER — METOPROLOL TARTRATE 50 MG PO TABS: 50.0000 mg | ORAL_TABLET | Freq: Two times a day (BID) | ORAL | Status: DC

## 2012-10-29 MED ORDER — ADULT MULTIVITAMIN W/MINERALS CH: 1.0000 | ORAL_TABLET | Freq: Every day | ORAL | Status: DC

## 2012-10-29 MED ORDER — LIVING WELL WITH DIABETES BOOK
Freq: Once | Status: AC
  Administered 2012-10-29: 11:00:00
  Filled 2012-10-29: qty 1

## 2012-10-29 MED ORDER — INSULIN GLARGINE 100 UNIT/ML ~~LOC~~ SOLN: 24.0000 [IU] | Freq: Every day | SUBCUTANEOUS | Status: DC

## 2012-10-29 MED ORDER — THIAMINE HCL 100 MG PO TABS: 100.0000 mg | ORAL_TABLET | Freq: Every day | ORAL | Status: DC

## 2012-10-29 MED ORDER — GLUCERNA SHAKE PO LIQD: 237.0000 mL | Freq: Three times a day (TID) | ORAL | Status: DC

## 2012-10-29 NOTE — Discharge Summary (Signed)
Physician Discharge Summary  Erica [name] [id number] DOB: 02-23-1955 DOA: 10/25/2012  PCP: Erica Graves, DO  Admit date: 10/25/2012 Discharge date: 10/29/2012  Time spent: 40  minutes  Recommendations for Outpatient Follow-up:  1. Home with [facility] 2. Follow up with PCP and cardiology as outpatient 3. recommend referral to endocrinology given recurrent ED visit and hospitalization for uncontrolled DM  Discharge Diagnoses:  Principle problem   Metabolic encephalopathy  Active Problems:   Hypokalemia   NSTEMI (non-ST elevated myocardial infarction)   Type II or unspecified type diabetes mellitus without mention of complication, uncontrolled   Hypotension   Metabolic acidosis   Chronic diarrhea   Alcohol abuse   DKA (diabetic ketoacidoses)   Tobacco abuse   Fever, unspecified   Protein-calorie malnutrition, severe   Rotavirus infection   Discharge Condition: fair  Diet recommendation: diabetic  Filed Weights   10/28/12 0500 10/28/12 1301 10/29/12 0500  Weight: 44.8 kg (98 lb 12.3 oz) 44.9 kg (98 lb 15.8 oz) 39.69 kg (87 lb 8 oz)    History of present illness:  Please refer to admission H&P for details, but in brief, 57 year old [place] American female with history of type 2 diabetes with recurrent hospitalization for DKA, alcohol abuse brought in by EMS for unresponsiveness and hypotension. Patient was unresponsive as per EMS with systolic blood pressure in the 60s and alcohol level of 44. Patient  noted to be in mild DKA and and also having unexplained diarrhea for 2 weeks .Patient reported having some chest pain as well and noted for elevated troponin.   Hospital Course:  Metabolic encephalopathy -In the setting of mild DKA, etoh intoxication  and dehydration with  Hypotension.  -Mild on presentation and now resolved. Off insulin drip and placed on SSI. Check A1C of 9.5. Strongly Doubt compliance with medication and diet. Has had recurrent visits to ED and  hospitalization for DKA.  Diabetic coordinator consulted.  Will discharge on lantus of 24 units daily and premeal novolog at outpatient regimen. Added premeal novolog 5 units 3 times daily with meals.  Would strongly recommend outpatient endocrinology referral for better diabetic control and teaching given recurrent hospitalization. Patient counseled strongly on dietary adherence and medication compliance.  .  NSTEMI  Possibly due to demand ischemia. Started on heparin drip.  She is allergic to aspirin. Blood pressure elevated and start her on  metoprolol. 2-D echo shows normal EF with grade 1 diastolic dysfn.. Cardiology consulted and think this is due to demand ischemia. off heparin drip. LDL of 76. Placed on lipitor. Follow up with cards as outpt .will discharge her on metoprolol and home dose lisinopril.    Fever  UA unremarkable. blood cultures negative. diarrhea resolved as well . CXR unremarkable. Now afebrile  Rotavirus detected on stool studies.   hypokalemia and hypomagnesemia  Secondary to DKA and dehydration. replenished   etoh Abuse  Patient reports drinking alcohol but is not able to quantify. Placed on CIWA. continue thiamine and folate . No signs of withdrawal.   malnutrition and deconditioning  PT eval and nutrition consult . Added supplements  Code Status: full code  Family Communication: d/w daughter  Disposition Plan: :home with [facility] tomorrow    Consultants:  Cardiology Procedures:  None Antibiotics:  none    Discharge Exam: Filed Vitals:   10/29/12 0529  BP: 145/88  Pulse: 90  Temp: 99.1 F (37.3 C)  Resp: 18   General: Middle aged thin built female lying in bed in no acute distress  HEENT: No pallor, dry oral mucosa  Chest: Clear to auscultation bilaterally, no added sounds  CVS: Normal S1 and S2, no murmurs rub or gallop  Abdomen: Soft, nontender, nondistended, bowel sounds present Extremities: Warm, no edema  CNS: AAO x3, no  tremors    Discharge Instructions  Discharge Orders   Future Appointments Provider Department Dept Phone   11/10/2012 10:00 AM Erica Lenis, MD [facility] [place]   Future Orders Complete By Expires   For home use only DME Glucometer  As directed        Medication List    STOP taking these medications       chlordiazePOXIDE 25 MG capsule  Commonly known as:  LIBRIUM     disulfiram 250 MG tablet  Commonly known as:  ANTABUSE     oxycodone 30 MG immediate release tablet  Commonly known as:  ROXICODONE      TAKE these medications       atorvastatin 40 MG tablet  Commonly known as:  LIPITOR  Take 1 tablet (40 mg total) by mouth daily at 6 PM.     ergocalciferol 50000 UNITS capsule  Commonly known as:  VITAMIN D2  Take 50,000 Units by mouth daily.     esomeprazole 40 MG capsule  Commonly known as:  NEXIUM  Take 40 mg by mouth daily.     feeding supplement Liqd  Take 237 mLs by mouth 3 (three) times daily between meals.     folic acid 1 MG tablet  Commonly known as:  FOLVITE  Take 1 mg by mouth daily.     glucose monitoring kit monitoring kit  1 each by Does not apply route as needed for other.     hyoscyamine 0.125 MG tablet  Commonly known as:  LEVSIN, ANASPAZ  Take 0.125 mg by mouth 4 (four) times daily - after meals and at bedtime.     insulin glargine 100 UNIT/ML injection  Commonly known as:  LANTUS  Inject 0.24 mLs (24 Units total) into the skin at bedtime.     lisinopril 10 MG tablet  Commonly known as:  PRINIVIL,ZESTRIL  Take 10 mg by mouth daily.     lubiprostone 8 MCG capsule  Commonly known as:  AMITIZA  Take 8 mcg by mouth 2 (two) times daily with a meal.     megestrol 40 MG tablet  Commonly known as:  MEGACE  Take 40 mg by mouth 2 (two) times daily.     metoprolol 50 MG tablet  Commonly known as:  LOPRESSOR  Take 1 tablet (50 mg total) by mouth 2 (two) times daily.     multivitamin with minerals Tabs  tablet  Take 1 tablet by mouth daily.     NOVOLOG FLEXPEN [place]  Inject 5 Units into the skin 3 (three) times daily with meals.      ondansetron 4 MG disintegrating tablet  Commonly known as:  ZOFRAN-ODT  Take 4 mg by mouth every 6 (six) hours as needed for nausea.     potassium bicarbonate 25 MEQ disintegrating tablet  Commonly known as:  K-LYTE  Take 25 mEq by mouth daily.     PROAIR HFA 108 (90 BASE) MCG/ACT inhaler  Generic drug:  albuterol  Inhale 2 puffs into the lungs every 6 (six) hours as needed for wheezing or shortness of breath.     thiamine 100 MG tablet  Take 1 tablet (100 mg total) by mouth daily.  traMADol 50 MG tablet  Commonly known as:  ULTRAM  Take 100 mg by mouth every 6 (six) hours as needed for pain.       Allergies  Allergen Reactions  . Aspirin Palpitations       Follow-up Information   Follow up with Erica Lenis, MD On 11/10/2012. (10am)    Specialty:  Cardiology   Contact information:   [place] [phone] [phone]       Follow up with BUTLER, CYNTHIA, DO In 1 week.   Contact information:   [address] [address] [phone]        The results of significant diagnostics from this hospitalization (including imaging, microbiology, ancillary and laboratory) are listed below for reference.    Significant Diagnostic Studies: Dg Chest Port 1 View  10/25/2012   CLINICAL DATA:  Shortness of Breath.  EXAM: PORTABLE CHEST - 1 VIEW  COMPARISON:  09/24/2012  FINDINGS: Mild peribronchial thickening. Heart is normal size. No confluent airspace opacities. No effusions. No acute bony abnormality.  IMPRESSION: Mild bronchitic changes.   Electronically Signed   By: Rolm Baptise M.D.   On: 10/25/2012 16:23    Microbiology: Recent Results (from the past 240 hour(s))  CULTURE, BLOOD (ROUTINE X 2)     Status: None   Collection Time    10/25/12  3:56 PM      Result Value Range Status    Specimen Description BLOOD FEMORAL ARTERY COLLECTED BY DOCTOR   Final   Special Requests     Final   Value: BOTTLES DRAWN AEROBIC AND ANAEROBIC AEB=8CC ANA=6CC   Culture NO GROWTH 3 DAYS   Final   Report Status PENDING   Incomplete  URINE CULTURE     Status: None   Collection Time    10/25/12  4:30 PM      Result Value Range Status   Specimen Description URINE, CATHETERIZED   Final   Special Requests NONE   Final   Culture  Setup Time     Final   Value: 10/25/2012 18:40     Performed at [place]     Final   Value: NO GROWTH     Performed at [facility]   Culture     Final   Value: NO GROWTH     Performed at [facility]   Report Status 10/27/2012 FINAL   Final  MRSA PCR SCREENING     Status: None   Collection Time    10/25/12  8:20 PM      Result Value Range Status   MRSA by PCR NEGATIVE  NEGATIVE Final   Comment:            The GeneXpert MRSA Assay (FDA     approved for NASAL specimens     only), is one component of a     comprehensive MRSA colonization     surveillance program. It is not     intended to diagnose MRSA     infection nor to guide or     monitor treatment for     MRSA infections.  CULTURE, BLOOD (ROUTINE X 2)     Status: None   Collection Time    10/26/12 10:07 AM      Result Value Range Status   Specimen Description BLOOD RIGHT HAND   Final   Special Requests BOTTLES DRAWN AEROBIC AND ANAEROBIC 8CC  Final   Culture NO GROWTH 3 DAYS   Final   Report Status PENDING   Incomplete  CULTURE, BLOOD (ROUTINE X 2)     Status: None   Collection Time    10/26/12 10:07 AM      Result Value Range Status   Specimen Description BLOOD RIGHT HAND   Final   Special Requests BOTTLES DRAWN AEROBIC AND ANAEROBIC 8CC   Final   Culture NO GROWTH 3 DAYS   Final   Report Status PENDING   Incomplete     Labs: Basic Metabolic Panel:  Recent Labs Lab 10/25/12 1556 10/25/12 1600 10/26/12 0431 10/27/12 0337 10/27/12 0804  10/28/12 0425  NA 142 143 143 136  --  136  K 2.7* 3.1* 3.8 2.9*  --  4.8  CL 106 111 114* 103  --  104  CO2 18*  --  22 25  --  23  GLUCOSE 371* 371* 194* 180*  --  269*  BUN 11 12 6  5*  --  7  CREATININE 0.96  0.94 1.20* 0.58 0.51  --  0.55  CALCIUM 8.9  --  8.1* 8.7  --  9.5  MG 1.3*  --   --   --  1.2* 1.4*   Liver Function Tests:  Recent Labs Lab 10/26/12 0431  AST 30  ALT 74*  ALKPHOS 87  BILITOT 0.6  PROT 5.3*  ALBUMIN 2.6*   No results found for this basename: LIPASE, AMYLASE,  in the last 168 hours No results found for this basename: AMMONIA,  in the last 168 hours CBC:  Recent Labs Lab 10/25/12 1556 10/25/12 1600 10/26/12 0431 10/27/12 0337  WBC 11.3*  --  8.1 7.1  NEUTROABS 8.6*  --   --   --   HGB 11.3* 10.9* 10.3* 10.3*  HCT 32.3* 32.0* 29.7* 29.1*  MCV 86.8  --  86.6 85.8  PLT 85*  --  203 185   Cardiac Enzymes:  Recent Labs Lab 10/25/12 1556 10/26/12 0903 10/26/12 1450 10/26/12 2009  TROPONINI <0.30 0.43* 0.46* 0.36*   BNP: BNP (last 3 results)  Recent Labs  09/24/12 1958  PROBNP 38.8   CBG:  Recent Labs Lab 10/28/12 0728 10/28/12 1136 10/28/12 1636 10/28/12 2116 10/29/12 0723  GLUCAP 252* 174* 108* 217* 177*       Signed:  Kylani Wires  [facility] 10/29/2012, 9:29 AM

## 2012-10-29 NOTE — Progress Notes (Addendum)
AVS reviewed with patient and patient's daughter.  Patient's daughter stated that she does not take the Ultram prn.  Stated that the Ultram was changed to the Oxycodone prn by Dr. Melina Copa.  Dr. Clementeen Graham on unit and RN asked him about this.  Dr. Clementeen Graham stated that it was ok that the patient does not take the Ultram.  He asked why the patient was on the Oxycodone.  Patient's daughter stated it was for pancreatitis.  Dr. Clementeen Graham stated she could take this after discussing it with her PCP.  It will be up to the PCP, Dr. Melina Copa.  Pt's daughter verbalized understanding of pt's medicine.  Stated that she takes care of her mother's medications.  DM education booklet provided to patient and patient's daughter.  Pt reports all belongings intact and in possession at discharge.  Pt escorted by NT to main entrance for discharge.  Pt stable at time of discharge.

## 2012-10-29 NOTE — Progress Notes (Signed)
Patient states that she does not have glucometer at home.  Dr. Clementeen Graham on unit and notified by RN.  Stated he will work on getting her one.

## 2012-10-31 LAB — CULTURE, BLOOD (ROUTINE X 2): Culture: NO GROWTH

## 2012-11-10 ENCOUNTER — Encounter: Payer: Medicaid Other | Admitting: Cardiology

## 2012-11-10 NOTE — Progress Notes (Signed)
Clinical Summary Ms. Richman is a 57 y.o.female 1. NSTEMI - recent admit 10/2012 w/ DKA, hypovolemia, severe metabolic acidosis, and acute EtOH intoxication w/ EtOH level of 44, and tachycardia. Initial trop was positive at 0.43, peaked at .46 then trended down. Echo showed normal LVEF, no WMA. No specific ischemic EKG changes. Thought to be demand ischemia, managed medically  - multiple prior admits w/ DKA    Past Medical History  Diagnosis Date  . Chronic diarrhea   . History of kidney stones   . Heart murmur   . Neuropathy     Hx: of  . GERD (gastroesophageal reflux disease)   . Diabetes mellitus     fasting blood sugar 110-120s  Tobacco   Allergies  Allergen Reactions  . Aspirin Palpitations     Current Outpatient Prescriptions  Medication Sig Dispense Refill  . albuterol (PROAIR HFA) 108 (90 BASE) MCG/ACT inhaler Inhale 2 puffs into the lungs every 6 (six) hours as needed for wheezing or shortness of breath.      Marland Kitchen atorvastatin (LIPITOR) 40 MG tablet Take 1 tablet (40 mg total) by mouth daily at 6 PM.  30 tablet  0  . ergocalciferol (VITAMIN D2) 50000 UNITS capsule Take 50,000 Units by mouth daily.      Marland Kitchen esomeprazole (NEXIUM) 40 MG capsule Take 40 mg by mouth daily.       . feeding supplement (GLUCERNA SHAKE) LIQD Take 237 mLs by mouth 3 (three) times daily between meals.  30 Can  2  . folic acid (FOLVITE) 1 MG tablet Take 1 mg by mouth daily.      Marland Kitchen glucose monitoring kit (FREESTYLE) monitoring kit 1 each by Does not apply route as needed for other.  1 each  0  . hyoscyamine (LEVSIN, ANASPAZ) 0.125 MG tablet Take 0.125 mg by mouth 4 (four) times daily - after meals and at bedtime.      . insulin aspart (NOVOLOG FLEXPEN) 100 UNIT/ML SOPN FlexPen Inject 5 Units into the skin 3 (three) times daily with meals.  10 mL  3  . insulin glargine (LANTUS) 100 UNIT/ML injection Inject 0.24 mLs (24 Units total) into the skin at bedtime.  10 mL  12  . lisinopril  (PRINIVIL,ZESTRIL) 10 MG tablet Take 10 mg by mouth daily.      Marland Kitchen lubiprostone (AMITIZA) 8 MCG capsule Take 8 mcg by mouth 2 (two) times daily with a meal.      . megestrol (MEGACE) 40 MG tablet Take 40 mg by mouth 2 (two) times daily.      . metoprolol (LOPRESSOR) 50 MG tablet Take 1 tablet (50 mg total) by mouth 2 (two) times daily.  60 tablet  0  . Multiple Vitamin (MULTIVITAMIN WITH MINERALS) TABS tablet Take 1 tablet by mouth daily.  30 tablet  0  . ondansetron (ZOFRAN-ODT) 4 MG disintegrating tablet Take 4 mg by mouth every 6 (six) hours as needed for nausea.      . potassium bicarbonate (K-LYTE) 25 MEQ disintegrating tablet Take 25 mEq by mouth daily.       Marland Kitchen thiamine 100 MG tablet Take 1 tablet (100 mg total) by mouth daily.  30 tablet  0  . traMADol (ULTRAM) 50 MG tablet Take 100 mg by mouth every 6 (six) hours as needed for pain.       No current facility-administered medications for this visit.     Past Surgical History  Procedure Laterality Date  . Colonoscopy  02/24/2010  . Tubal ligation    . Multiple extractions with alveoloplasty N/A 06/13/2012    Procedure: MULTIPLE EXTRACION WITH ALVEOLOPLASTY EXTRACT: 18, 19, 20, 21, 22, 24, 25, 27, 28, 29, 30, 31;  Surgeon: Gae Bon, DDS;  Location: [facility];  Service: Oral Surgery;  Laterality: N/A;     Allergies  Allergen Reactions  . Aspirin Palpitations      No family history on file.   Social History Ms. Kranz reports that she has been smoking Cigarettes.  She has been smoking about 0.00 packs per day. Her smokeless tobacco use includes Snuff. Ms. Gagliano reports that  drinks alcohol.   Review of Systems 12 point ROS negative other than reported in HPI  Physical Examination There were no vitals filed for this visit. There were no vitals filed for this visit.  Gen: resting comfortably, NAD HEENT: no scleral icterus, pupils equal round and reactive, no palptable cervical adenopathy CV Pulm: CTAB Abd: soft,  NT, ND NABS, no hepatosplenomegaly Ext: warm, no edema.  Skin: warm, no rash Neuro: A&Ox3, no focal deficits    Diagnostic Studies 10/26/12 Echo: LVEF 60-65%, no WMA, grade I diastolic dysfunction, moderate LVH    Assessment and Plan     Arnoldo Lenis, M.D., F.A.C.C.

## 2012-11-28 ENCOUNTER — Encounter: Payer: Self-pay | Admitting: Cardiology

## 2012-11-28 ENCOUNTER — Encounter: Payer: Medicaid Other | Admitting: Cardiology

## 2012-11-28 NOTE — Progress Notes (Signed)
Clinical Summary Ms. Covan is a 57 y.o.female seen as a new patient today  1. NSTEMI - recent admission 10/2012 with severe DKA, metabolic acidosis,  hypovolemia and hypotension. In that setting found to have an elevated troponin. Peak trop 0.46, no specific ischemic changes on EKG - echo with LVEF 60-65%, no wall motion abnormalities.      Past Medical History  Diagnosis Date  . Chronic diarrhea   . History of kidney stones   . Heart murmur   . Neuropathy     Hx: of  . GERD (gastroesophageal reflux disease)   . Diabetes mellitus     fasting blood sugar 110-120s     Allergies  Allergen Reactions  . Aspirin Palpitations     Current Outpatient Prescriptions  Medication Sig Dispense Refill  . albuterol (PROAIR HFA) 108 (90 BASE) MCG/ACT inhaler Inhale 2 puffs into the lungs every 6 (six) hours as needed for wheezing or shortness of breath.      Marland Kitchen atorvastatin (LIPITOR) 40 MG tablet Take 1 tablet (40 mg total) by mouth daily at 6 PM.  30 tablet  0  . ergocalciferol (VITAMIN D2) 50000 UNITS capsule Take 50,000 Units by mouth daily.      Marland Kitchen esomeprazole (NEXIUM) 40 MG capsule Take 40 mg by mouth daily.       . feeding supplement (GLUCERNA SHAKE) LIQD Take 237 mLs by mouth 3 (three) times daily between meals.  30 Can  2  . folic acid (FOLVITE) 1 MG tablet Take 1 mg by mouth daily.      Marland Kitchen glucose monitoring kit (FREESTYLE) monitoring kit 1 each by Does not apply route as needed for other.  1 each  0  . hyoscyamine (LEVSIN, ANASPAZ) 0.125 MG tablet Take 0.125 mg by mouth 4 (four) times daily - after meals and at bedtime.      . insulin aspart (NOVOLOG FLEXPEN) 100 UNIT/ML SOPN FlexPen Inject 5 Units into the skin 3 (three) times daily with meals.  10 mL  3  . insulin glargine (LANTUS) 100 UNIT/ML injection Inject 0.24 mLs (24 Units total) into the skin at bedtime.  10 mL  12  . lisinopril (PRINIVIL,ZESTRIL) 10 MG tablet Take 10 mg by mouth daily.      Marland Kitchen lubiprostone  (AMITIZA) 8 MCG capsule Take 8 mcg by mouth 2 (two) times daily with a meal.      . megestrol (MEGACE) 40 MG tablet Take 40 mg by mouth 2 (two) times daily.      . metoprolol (LOPRESSOR) 50 MG tablet Take 1 tablet (50 mg total) by mouth 2 (two) times daily.  60 tablet  0  . Multiple Vitamin (MULTIVITAMIN WITH MINERALS) TABS tablet Take 1 tablet by mouth daily.  30 tablet  0  . ondansetron (ZOFRAN-ODT) 4 MG disintegrating tablet Take 4 mg by mouth every 6 (six) hours as needed for nausea.      . potassium bicarbonate (K-LYTE) 25 MEQ disintegrating tablet Take 25 mEq by mouth daily.       Marland Kitchen thiamine 100 MG tablet Take 1 tablet (100 mg total) by mouth daily.  30 tablet  0  . traMADol (ULTRAM) 50 MG tablet Take 100 mg by mouth every 6 (six) hours as needed for pain.       No current facility-administered medications for this visit.     Past Surgical History  Procedure Laterality Date  . Colonoscopy  02/24/2010  . Tubal ligation    .  Multiple extractions with alveoloplasty N/A 06/13/2012    Procedure: MULTIPLE EXTRACION WITH ALVEOLOPLASTY EXTRACT: 18, 19, 20, 21, 22, 24, 25, 27, 28, 29, 30, 31;  Surgeon: Gae Bon, DDS;  Location: [facility];  Service: Oral Surgery;  Laterality: N/A;     Allergies  Allergen Reactions  . Aspirin Palpitations      No family history on file.   Social History Ms. Shiflett reports that she has been smoking Cigarettes.  She has been smoking about 0.00 packs per day. Her smokeless tobacco use includes Snuff. Ms. Branyan reports that she drinks alcohol.   Review of Systems CONSTITUTIONAL: No weight loss, fever, chills, weakness or fatigue.  HEENT: Eyes: No visual loss, blurred vision, double vision or yellow sclerae.No hearing loss, sneezing, congestion, runny nose or sore throat.  SKIN: No rash or itching.  CARDIOVASCULAR:  RESPIRATORY: No shortness of breath, cough or sputum.  GASTROINTESTINAL: No anorexia, nausea, vomiting or diarrhea. No abdominal  pain or blood.  GENITOURINARY: No burning on urination, no polyuria NEUROLOGICAL: No headache, dizziness, syncope, paralysis, ataxia, numbness or tingling in the extremities. No change in bowel or bladder control.  MUSCULOSKELETAL: No muscle, back pain, joint pain or stiffness.  LYMPHATICS: No enlarged nodes. No history of splenectomy.  PSYCHIATRIC: No history of depression or anxiety.  ENDOCRINOLOGIC: No reports of sweating, cold or heat intolerance. No polyuria or polydipsia.  Marland Kitchen   Physical Examination There were no vitals filed for this visit. There were no vitals filed for this visit.  Gen: resting comfortably, no acute distress HEENT: no scleral icterus, pupils equal round and reactive, no palptable cervical adenopathy,  CV Resp: Clear to auscultation bilaterally GI: abdomen is soft, non-tender, non-distended, normal bowel sounds, no hepatosplenomegaly MSK: extremities are warm, no edema.  Skin: warm, no rash Neuro:  no focal deficits Psych: appropriate affect   Diagnostic Studies 10/2012 Echo: LVEF 123456, grade I diastolic dysfunction, no WMA, mod LVH    Assessment and Plan        Arnoldo Lenis, M.D., F.A.C.C.

## 2012-12-07 ENCOUNTER — Emergency Department (HOSPITAL_COMMUNITY): Payer: Medicaid Other

## 2012-12-07 ENCOUNTER — Emergency Department (HOSPITAL_COMMUNITY)
Admission: EM | Admit: 2012-12-07 | Discharge: 2012-12-07 | Disposition: A | Discharge status: Home / Self Care | Payer: Medicaid Other | Attending: Emergency Medicine | Admitting: Emergency Medicine

## 2012-12-07 ENCOUNTER — Encounter (HOSPITAL_COMMUNITY): Payer: Self-pay | Admitting: Emergency Medicine

## 2012-12-07 DIAGNOSIS — R110 Nausea: Secondary | ICD-10-CM | POA: Insufficient documentation

## 2012-12-07 DIAGNOSIS — M79651 Pain in right thigh: Secondary | ICD-10-CM

## 2012-12-07 DIAGNOSIS — M25559 Pain in unspecified hip: Secondary | ICD-10-CM | POA: Insufficient documentation

## 2012-12-07 DIAGNOSIS — Z87442 Personal history of urinary calculi: Secondary | ICD-10-CM | POA: Insufficient documentation

## 2012-12-07 DIAGNOSIS — E119 Type 2 diabetes mellitus without complications: Secondary | ICD-10-CM | POA: Insufficient documentation

## 2012-12-07 DIAGNOSIS — R011 Cardiac murmur, unspecified: Secondary | ICD-10-CM | POA: Insufficient documentation

## 2012-12-07 DIAGNOSIS — G589 Mononeuropathy, unspecified: Secondary | ICD-10-CM | POA: Insufficient documentation

## 2012-12-07 DIAGNOSIS — F17200 Nicotine dependence, unspecified, uncomplicated: Secondary | ICD-10-CM | POA: Insufficient documentation

## 2012-12-07 DIAGNOSIS — R1011 Right upper quadrant pain: Secondary | ICD-10-CM

## 2012-12-07 DIAGNOSIS — Z79899 Other long term (current) drug therapy: Secondary | ICD-10-CM | POA: Insufficient documentation

## 2012-12-07 DIAGNOSIS — Z794 Long term (current) use of insulin: Secondary | ICD-10-CM | POA: Insufficient documentation

## 2012-12-07 DIAGNOSIS — K219 Gastro-esophageal reflux disease without esophagitis: Secondary | ICD-10-CM | POA: Insufficient documentation

## 2012-12-07 LAB — CBC WITH DIFFERENTIAL/PLATELET
Basophils Relative: 0 % (ref 0–1)
Eosinophils Absolute: 0.2 10*3/uL (ref 0.0–0.7)
HCT: 34.8 % — ABNORMAL LOW (ref 36.0–46.0)
Hemoglobin: 12.1 g/dL (ref 12.0–15.0)
MCH: 30.3 pg (ref 26.0–34.0)
MCHC: 34.8 g/dL (ref 30.0–36.0)
Monocytes Absolute: 0.6 10*3/uL (ref 0.1–1.0)
Monocytes Relative: 9 % (ref 3–12)

## 2012-12-07 LAB — LIPASE, BLOOD: Lipase: 7 U/L — ABNORMAL LOW (ref 11–59)

## 2012-12-07 LAB — COMPREHENSIVE METABOLIC PANEL
Albumin: 3.3 g/dL — ABNORMAL LOW (ref 3.5–5.2)
BUN: 6 mg/dL (ref 6–23)
Calcium: 9.3 mg/dL (ref 8.4–10.5)
Creatinine, Ser: 0.42 mg/dL — ABNORMAL LOW (ref 0.50–1.10)
Total Protein: 6.6 g/dL (ref 6.0–8.3)

## 2012-12-07 LAB — D-DIMER, QUANTITATIVE (NOT AT ARMC): D-Dimer, Quant: 1.2 ug/mL-FEU — ABNORMAL HIGH (ref 0.00–0.48)

## 2012-12-07 MED ORDER — IOHEXOL 300 MG/ML  SOLN
100.0000 mL | Freq: Once | INTRAMUSCULAR | Status: AC | PRN
  Administered 2012-12-07: 100 mL via INTRAVENOUS

## 2012-12-07 MED ORDER — SODIUM CHLORIDE 0.9 % IV SOLN
Freq: Once | INTRAVENOUS | Status: AC
  Administered 2012-12-07: 01:00:00 via INTRAVENOUS

## 2012-12-07 MED ORDER — MORPHINE SULFATE 4 MG/ML IJ SOLN
4.0000 mg | Freq: Once | INTRAMUSCULAR | Status: AC
  Administered 2012-12-07: 4 mg via INTRAVENOUS
  Filled 2012-12-07: qty 1

## 2012-12-07 MED ORDER — IOHEXOL 300 MG/ML  SOLN
50.0000 mL | Freq: Once | INTRAMUSCULAR | Status: AC | PRN
  Administered 2012-12-07: 50 mL via ORAL

## 2012-12-07 MED ORDER — ONDANSETRON HCL 4 MG/2ML IJ SOLN
4.0000 mg | Freq: Once | INTRAMUSCULAR | Status: AC
Start: 2012-12-07 — End: 2012-12-07
  Administered 2012-12-07: 4 mg via INTRAVENOUS
  Filled 2012-12-07: qty 2

## 2012-12-07 MED ORDER — HYDROCODONE-ACETAMINOPHEN 5-325 MG PO TABS: 1.0000 | ORAL_TABLET | ORAL | Status: DC | PRN

## 2012-12-07 MED ORDER — ENOXAPARIN SODIUM 60 MG/0.6ML ~~LOC~~ SOLN
1.0000 mg/kg | Freq: Once | SUBCUTANEOUS | Status: AC
  Administered 2012-12-07: 45 mg via SUBCUTANEOUS
  Filled 2012-12-07: qty 0.6

## 2012-12-07 NOTE — ED Notes (Signed)
Arrives EMS, complaining of right sided rib painx 2 months, right leg pain x 2 days.

## 2012-12-07 NOTE — ED Notes (Signed)
Pt resting quietly with eyes closed.  Respirations regular, even and unlabored.  No distress noted.  

## 2012-12-07 NOTE — ED Notes (Signed)
Patient able to ambulate with no assistance.  Patient denies any problems walking

## 2012-12-07 NOTE — ED Provider Notes (Signed)
CSN: [id number]     Arrival date & time 12/07/12  0012 History   First MD Initiated Contact with Patient 12/07/12 0015     Chief Complaint  Patient presents with  . right side rib pain    (Consider location/radiation/quality/duration/timing/severity/associated sxs/prior treatment)  The history is provided by the patient.  57 year old female comes in with a two-month history of pain in the right upper abdomen which radiates up into the right side of the chest. Pain is both sharp and dull as well as burning. She rates pain at 9/10. Nothing makes it worse. Feels better she holds that area. There is associated nausea but no vomiting. She denies constipation or diarrhea. Appetite has been good. She denies fever or chills but has had some night sweats. She has not done anything to try and help her symptoms. Also, she is complaining of pain in the right thigh for the last 2 days. The pain is worse with standing or walking. It is better when she lies still.  Past Medical History  Diagnosis Date  . Chronic diarrhea   . History of kidney stones   . Heart murmur   . Neuropathy     Hx: of  . GERD (gastroesophageal reflux disease)   . Diabetes mellitus     fasting blood sugar 110-120s   Past Surgical History  Procedure Laterality Date  . Colonoscopy  02/24/2010  . Tubal ligation    . Multiple extractions with alveoloplasty N/A 06/13/2012    Procedure: MULTIPLE EXTRACION WITH ALVEOLOPLASTY EXTRACT: 18, 19, 20, 21, 22, 24, 25, 27, 28, 29, 30, 31;  Surgeon: Gae Bon, DDS;  Location: [facility];  Service: Oral Surgery;  Laterality: N/A;   No family history on file. History  Substance Use Topics  . Smoking status: Current Every Day Smoker    Types: Cigarettes  . Smokeless tobacco: Current User    Types: Snuff  . Alcohol Use: 0.0 oz/week     Comment: weekly   OB History   Grav Para Term Preterm Abortions TAB SAB Ect Mult Living                 Review of Systems  All other systems reviewed  and are negative.    Allergies  Aspirin  Home Medications   Current Outpatient Rx  Name  Route  Sig  Dispense  Refill  . albuterol (PROAIR HFA) 108 (90 BASE) MCG/ACT inhaler   Inhalation   Inhale 2 puffs into the lungs every 6 (six) hours as needed for wheezing or shortness of breath.         . ergocalciferol (VITAMIN D2) 50000 UNITS capsule   Oral   Take 50,000 Units by mouth daily.         Marland Kitchen esomeprazole (NEXIUM) 40 MG capsule   Oral   Take 40 mg by mouth daily.          . folic acid (FOLVITE) 1 MG tablet   Oral   Take 1 mg by mouth daily.         . hyoscyamine (LEVSIN, ANASPAZ) 0.125 MG tablet   Oral   Take 0.125 mg by mouth 4 (four) times daily - after meals and at bedtime.         Marland Kitchen lisinopril (PRINIVIL,ZESTRIL) 10 MG tablet   Oral   Take 10 mg by mouth daily.         Marland Kitchen lubiprostone (AMITIZA) 8 MCG capsule   Oral   Take 8  mcg by mouth 2 (two) times daily with a meal.         . megestrol (MEGACE) 40 MG tablet   Oral   Take 40 mg by mouth 2 (two) times daily.         . ondansetron (ZOFRAN-ODT) 4 MG disintegrating tablet   Oral   Take 4 mg by mouth every 6 (six) hours as needed for nausea.         . potassium bicarbonate (K-LYTE) 25 MEQ disintegrating tablet   Oral   Take 25 mEq by mouth daily.          . traMADol (ULTRAM) 50 MG tablet   Oral   Take 100 mg by mouth every 6 (six) hours as needed for pain.         Marland Kitchen atorvastatin (LIPITOR) 40 MG tablet   Oral   Take 1 tablet (40 mg total) by mouth daily at 6 PM.   30 tablet   0   . feeding supplement (GLUCERNA SHAKE) LIQD   Oral   Take 237 mLs by mouth 3 (three) times daily between meals.   30 Can   2   . glucose monitoring kit (FREESTYLE) monitoring kit   Does not apply   1 each by Does not apply route as needed for other.   1 each   0   . insulin aspart (NOVOLOG FLEXPEN) 100 UNIT/ML SOPN FlexPen   Subcutaneous   Inject 5 Units into the skin 3 (three) times daily with  meals.   10 mL   3   . insulin glargine (LANTUS) 100 UNIT/ML injection   Subcutaneous   Inject 0.24 mLs (24 Units total) into the skin at bedtime.   10 mL   12   . metoprolol (LOPRESSOR) 50 MG tablet   Oral   Take 1 tablet (50 mg total) by mouth 2 (two) times daily.   60 tablet   0   . Multiple Vitamin (MULTIVITAMIN WITH MINERALS) TABS tablet   Oral   Take 1 tablet by mouth daily.   30 tablet   0   . thiamine 100 MG tablet   Oral   Take 1 tablet (100 mg total) by mouth daily.   30 tablet   0    BP 143/87  Pulse 107  Temp(Src) 99 F (37.2 C) (Oral)  Resp 18  Ht 5' (1.524 m)  Wt 98 lb (44.453 kg)  BMI 19.14 kg/m2  SpO2 100% Physical Exam  Nursing note and vitals reviewed.  Cachectic 57 year old female, resting comfortably and in no acute distress. Vital signs are significant for tachycardia with heart rate 107, and borderline hypertension with blood pressure 143/87. Oxygen saturation is 100%, which is normal. Head is normocephalic and atraumatic. PERRLA, EOMI. Oropharynx is clear. Neck is nontender and supple without adenopathy or JVD. Back is nontender and there is no CVA tenderness. Lungs are clear without rales, wheezes, or rhonchi. Chest is nontender. Heart has regular rate and rhythm without murmur. Abdomen is soft, flat, with moderate tenderness in the epigastrium and right upper quadrant. There is no rebound or guarding. There are no masses or hepatosplenomegaly and peristalsis is hypoactive. Extremities have no cyanosis or edema, full range of motion is present. There is mild to moderate tenderness in the right inguinal area and proximal right thigh. Right thigh circumference is 1.5 cm greater than left thigh circumference. There is no calf tenderness and calf circumference is symmetric. Skin is warm and  dry without rash. Neurologic: Mental status is normal, cranial nerves are intact, there are no motor or sensory deficits.  ED Course  Procedures (including  critical care time) Labs Review Results for orders placed during the hospital encounter of 12/07/12  CBC WITH DIFFERENTIAL      Result Value Range   WBC 6.1  4.0 - 10.5 K/uL   RBC 3.99  3.87 - 5.11 MIL/uL   Hemoglobin 12.1  12.0 - 15.0 g/dL   HCT 34.8 (*) 36.0 - 46.0 %   MCV 87.2  78.0 - 100.0 fL   MCH 30.3  26.0 - 34.0 pg   MCHC 34.8  30.0 - 36.0 g/dL   RDW 13.1  11.5 - 15.5 %   Platelets 203  150 - 400 K/uL   Neutrophils Relative % 50  43 - 77 %   Neutro Abs 3.1  1.7 - 7.7 K/uL   Lymphocytes Relative 38  12 - 46 %   Lymphs Abs 2.3  0.7 - 4.0 K/uL   Monocytes Relative 9  3 - 12 %   Monocytes Absolute 0.6  0.1 - 1.0 K/uL   Eosinophils Relative 3  0 - 5 %   Eosinophils Absolute 0.2  0.0 - 0.7 K/uL   Basophils Relative 0  0 - 1 %   Basophils Absolute 0.0  0.0 - 0.1 K/uL  COMPREHENSIVE METABOLIC PANEL      Result Value Range   Sodium 135  135 - 145 mEq/L   Potassium 3.3 (*) 3.5 - 5.1 mEq/L   Chloride 99  96 - 112 mEq/L   CO2 24  19 - 32 mEq/L   Glucose, Bld 250 (*) 70 - 99 mg/dL   BUN 6  6 - 23 mg/dL   Creatinine, Ser 0.42 (*) 0.50 - 1.10 mg/dL   Calcium 9.3  8.4 - 10.5 mg/dL   Total Protein 6.6  6.0 - 8.3 g/dL   Albumin 3.3 (*) 3.5 - 5.2 g/dL   AST 24  0 - 37 U/L   ALT 28  0 - 35 U/L   Alkaline Phosphatase 90  39 - 117 U/L   Total Bilirubin 0.5  0.3 - 1.2 mg/dL   GFR calc non Af Amer >90  >90 mL/min   GFR calc Af Amer >90  >90 mL/min  LIPASE, BLOOD      Result Value Range   Lipase 7 (*) 11 - 59 U/L  D-DIMER, QUANTITATIVE      Result Value Range   D-Dimer, Quant 1.20 (*) 0.00 - 0.48 ug/mL-FEU  LACTIC ACID, PLASMA      Result Value Range   Lactic Acid, Venous 2.2  0.5 - 2.2 mmol/L   Imaging Review Dg Chest 2 View  12/07/2012   CLINICAL DATA:  Right chest pain and right rib pain for 2 months.  EXAM: CHEST  2 VIEW  COMPARISON:  10/25/2012  FINDINGS: Old right rib fractures, stable since previous study. No acute rib fractures are visualized. Anterior compression  fracture of T10 has developed since the prior chest x-ray of 09/09/2012. Acute compression is not excluded. Normal heart size and pulmonary vascularity. No focal airspace disease or consolidation in the lungs. No blunting of costophrenic angles. No pneumothorax. Mediastinal contours appear intact.  IMPRESSION: Age indeterminate compression fracture of T10, new since 09/09/2012. No evidence of active pulmonary disease.   Electronically Signed   By: Lucienne Capers M.D.   On: 12/07/2012 02:50   Ct Abdomen Pelvis W Contrast  12/07/2012   CLINICAL DATA:  Right upper quadrant pain for 2 months, worse tonight. History of chronic diarrhea.  EXAM: CT ABDOMEN AND PELVIS WITH CONTRAST  TECHNIQUE: Multidetector CT imaging of the abdomen and pelvis was performed using the standard protocol following bolus administration of intravenous contrast.  CONTRAST:  59mL OMNIPAQUE IOHEXOL 300 MG/ML SOLN, 137mL OMNIPAQUE IOHEXOL 300 MG/ML SOLN  COMPARISON:  None.  FINDINGS: Mild dependent atelectasis in the lung bases.  The gallbladder is not visualized and could be surgically absent or contracted. There is mild intrahepatic bile duct dilatation without extrahepatic ductal dilatation. Correlation with liver function studies is recommended. If liver function studies are elevated, elective MRI may be useful in further evaluation. Diffuse calcification and atrophy of the pancreas. Changes are consistent with chronic pancreatitis. The spleen, adrenal glands, kidneys, abdominal aorta, inferior vena cava, and retroperitoneal lymph nodes are unremarkable. The stomach is filled with contrast material. There is no gastric wall thickening or bowel obstruction suggested. Thickening of the pyloric region is probably due to muscular contraction although inflammatory changes not excluded. Small bowel and colon are not abnormally distended. Stool fills the colon. No discrete wall thickening appreciated. No free air or free fluid in the abdomen.   Pelvis: Uterus and ovaries are not enlarged. Bladder wall is not thickened. Stool-filled rectosigmoid colon. No evidence of diverticulitis. Appendix is not identified. No free or loculated pelvic fluid collections. Mild anterior subluxation of L4 on L5. Compression of T10 vertebra. Degenerative changes in the lumbar spine.  IMPRESSION: Nonspecific intrahepatic bile duct dilatation. Correlation with liver function studies is recommended. Calcification and atrophy of the pancreas consistent with chronic pancreatitis. No acute changes demonstrated. Stool-filled colon without wall thickening.   Electronically Signed   By: Lucienne Capers M.D.   On: 12/07/2012 03:19   Images viewed by me.  EKG Interpretation     Ventricular Rate:  104 PR Interval:  160 QRS Duration: 62 QT Interval:  330 QTC Calculation: 433 R Axis:   0 Text Interpretation:  Sinus tachycardia Otherwise normal ECG When compared with ECG of 25-Oct-2012 16:12, No significant change was found            MDM   1. RUQ pain   2. Pain of right thigh    Right upper quadrant pain of uncertain cause. She will be sent for CT scan. Leg pain is worrisome for DVT. She will be screened with d-dimer and consider getting Doppler tests in the morning. Old records are reviewed and she had a hospitalization about 6 weeks ago for altered mental status in ketoacidosis. She states that she did discuss her abdominal pain when she was in the hospital.  CT is unremarkable. D-dimer is elevated. I am worried about possible DVT. She is given a dose of enoxaparin and will be kept in the ED for venous ultrasound. Case is signed out to Dr. Regenia Skeeter.  Delora Fuel, MD [id number]

## 2012-12-07 NOTE — ED Provider Notes (Signed)
8:24 AM care transferred to me. Patient has a normal DVT ultrasound. Patient ambulated without difficulty. Will discharge patient with oral pain medication and outpatient followup.  Ephraim Hamburger, MD 12/07/12 [phone]

## 2013-03-18 ENCOUNTER — Inpatient Hospital Stay (HOSPITAL_COMMUNITY)
Admission: EM | Admit: 2013-03-18 | Discharge: 2013-03-20 | DRG: 640 | Disposition: A | Discharge status: Home / Self Care | Payer: Medicaid Other | Attending: Internal Medicine | Admitting: Internal Medicine

## 2013-03-18 ENCOUNTER — Encounter (HOSPITAL_COMMUNITY): Payer: Self-pay | Admitting: Emergency Medicine

## 2013-03-18 DIAGNOSIS — F10939 Alcohol use, unspecified with withdrawal, unspecified: Secondary | ICD-10-CM

## 2013-03-18 DIAGNOSIS — I214 Non-ST elevation (NSTEMI) myocardial infarction: Secondary | ICD-10-CM

## 2013-03-18 DIAGNOSIS — E876 Hypokalemia: Secondary | ICD-10-CM

## 2013-03-18 DIAGNOSIS — F10239 Alcohol dependence with withdrawal, unspecified: Secondary | ICD-10-CM

## 2013-03-18 DIAGNOSIS — K861 Other chronic pancreatitis: Secondary | ICD-10-CM | POA: Diagnosis present

## 2013-03-18 DIAGNOSIS — I959 Hypotension, unspecified: Secondary | ICD-10-CM

## 2013-03-18 DIAGNOSIS — G9341 Metabolic encephalopathy: Secondary | ICD-10-CM

## 2013-03-18 DIAGNOSIS — R7989 Other specified abnormal findings of blood chemistry: Secondary | ICD-10-CM

## 2013-03-18 DIAGNOSIS — E1110 Type 2 diabetes mellitus with ketoacidosis without coma: Secondary | ICD-10-CM

## 2013-03-18 DIAGNOSIS — Z8719 Personal history of other diseases of the digestive system: Secondary | ICD-10-CM

## 2013-03-18 DIAGNOSIS — IMO0001 Reserved for inherently not codable concepts without codable children: Secondary | ICD-10-CM

## 2013-03-18 DIAGNOSIS — F10929 Alcohol use, unspecified with intoxication, unspecified: Secondary | ICD-10-CM

## 2013-03-18 DIAGNOSIS — Z681 Body mass index (BMI) 19 or less, adult: Secondary | ICD-10-CM

## 2013-03-18 DIAGNOSIS — R739 Hyperglycemia, unspecified: Secondary | ICD-10-CM

## 2013-03-18 DIAGNOSIS — R509 Fever, unspecified: Secondary | ICD-10-CM

## 2013-03-18 DIAGNOSIS — E43 Unspecified severe protein-calorie malnutrition: Secondary | ICD-10-CM

## 2013-03-18 DIAGNOSIS — E1122 Type 2 diabetes mellitus with diabetic chronic kidney disease: Secondary | ICD-10-CM | POA: Diagnosis present

## 2013-03-18 DIAGNOSIS — F17200 Nicotine dependence, unspecified, uncomplicated: Secondary | ICD-10-CM | POA: Diagnosis present

## 2013-03-18 DIAGNOSIS — Z9119 Patient's noncompliance with other medical treatment and regimen: Secondary | ICD-10-CM

## 2013-03-18 DIAGNOSIS — E872 Acidosis, unspecified: Secondary | ICD-10-CM

## 2013-03-18 DIAGNOSIS — R109 Unspecified abdominal pain: Secondary | ICD-10-CM

## 2013-03-18 DIAGNOSIS — F1010 Alcohol abuse, uncomplicated: Secondary | ICD-10-CM

## 2013-03-18 DIAGNOSIS — E860 Dehydration: Principal | ICD-10-CM

## 2013-03-18 DIAGNOSIS — E1165 Type 2 diabetes mellitus with hyperglycemia: Secondary | ICD-10-CM

## 2013-03-18 DIAGNOSIS — E162 Hypoglycemia, unspecified: Secondary | ICD-10-CM

## 2013-03-18 DIAGNOSIS — K529 Noninfective gastroenteritis and colitis, unspecified: Secondary | ICD-10-CM

## 2013-03-18 DIAGNOSIS — R112 Nausea with vomiting, unspecified: Secondary | ICD-10-CM

## 2013-03-18 DIAGNOSIS — K219 Gastro-esophageal reflux disease without esophagitis: Secondary | ICD-10-CM | POA: Diagnosis present

## 2013-03-18 DIAGNOSIS — Z794 Long term (current) use of insulin: Secondary | ICD-10-CM

## 2013-03-18 DIAGNOSIS — Z91199 Patient's noncompliance with other medical treatment and regimen due to unspecified reason: Secondary | ICD-10-CM

## 2013-03-18 DIAGNOSIS — R011 Cardiac murmur, unspecified: Secondary | ICD-10-CM

## 2013-03-18 DIAGNOSIS — A080 Rotaviral enteritis: Secondary | ICD-10-CM

## 2013-03-18 DIAGNOSIS — I10 Essential (primary) hypertension: Secondary | ICD-10-CM | POA: Diagnosis present

## 2013-03-18 DIAGNOSIS — Z833 Family history of diabetes mellitus: Secondary | ICD-10-CM

## 2013-03-18 DIAGNOSIS — Z720 Tobacco use: Secondary | ICD-10-CM

## 2013-03-18 DIAGNOSIS — R945 Abnormal results of liver function studies: Secondary | ICD-10-CM

## 2013-03-18 DIAGNOSIS — E119 Type 2 diabetes mellitus without complications: Secondary | ICD-10-CM

## 2013-03-18 LAB — GLUCOSE, CAPILLARY: GLUCOSE-CAPILLARY: 289 mg/dL — AB (ref 70–99)

## 2013-03-18 MED ORDER — ONDANSETRON HCL 4 MG/2ML IJ SOLN
4.0000 mg | Freq: Once | INTRAMUSCULAR | Status: AC
  Administered 2013-03-19: 4 mg via INTRAVENOUS
  Filled 2013-03-18: qty 2

## 2013-03-18 MED ORDER — SODIUM CHLORIDE 0.9 % IV BOLUS (SEPSIS)
1000.0000 mL | Freq: Once | INTRAVENOUS | Status: AC
  Administered 2013-03-19: 1000 mL via INTRAVENOUS

## 2013-03-18 NOTE — ED Provider Notes (Signed)
CSN: [id number]     Arrival date & time 03/18/13  2325 History  This chart was scribed for Sharyon Cable, MD by Eston Mould, ED Scribe. This patient was seen in room APA02/APA02 and the patient's care was started at  11:36 PM  Chief Complaint  Patient presents with  . Hyperglycemia   Patient is a 58 y.o. female presenting with vomiting. The history is provided by the patient. No language interpreter was used.  Emesis Severity:  Moderate Number of daily episodes:  3 Progression:  Unchanged Recent urination:  Normal Relieved by:  Nothing Worsened by:  Nothing tried Ineffective treatments:  None tried Associated symptoms: abdominal pain   Associated symptoms: no diarrhea   Risk factors: alcohol use and diabetes    HPI Comments: Erica [name] is a 58 y.o. female who presents to the Emergency Department complaining of 3 episodes of emesis and abd pain that began today. Pt states she had an alcohol beverage (Vodka)at 12pm and states she began vomiting shortly after. Pt states she is unsure if she had blood in vomit. Pt states her abd pain has been present "for a while". Pt denies drinking alcohol everyday and states she "use to drink everyday" but reports "cutting down on drinks". Pt states she drinks about a pint 3 times a week. Pt states her PCP is Dr. Octavio Graves. Pt denies diarrhea, CP, and SOB.  Pt states she did not take her insulin today due to being intoxicated and not eating  Past Medical History  Diagnosis Date  . Chronic diarrhea   . History of kidney stones   . Heart murmur   . Neuropathy     Hx: of  . GERD (gastroesophageal reflux disease)   . Diabetes mellitus     fasting blood sugar 110-120s   Past Surgical History  Procedure Laterality Date  . Colonoscopy  02/24/2010  . Tubal ligation    . Multiple extractions with alveoloplasty N/A 06/13/2012    Procedure: MULTIPLE EXTRACION WITH ALVEOLOPLASTY EXTRACT: 18, 19, 20, 21, 22, 24, 25, 27, 28, 29,  30, 31;  Surgeon: Gae Bon, DDS;  Location: [facility];  Service: Oral Surgery;  Laterality: N/A;   No family history on file. History  Substance Use Topics  . Smoking status: Current Every Day Smoker    Types: Cigarettes  . Smokeless tobacco: Current User    Types: Snuff  . Alcohol Use: 0.0 oz/week     Comment: weekly   OB History   Grav Para Term Preterm Abortions TAB SAB Ect Mult Living                 Review of Systems  Respiratory: Negative for shortness of breath.   Cardiovascular: Negative for chest pain.  Gastrointestinal: Positive for vomiting and abdominal pain. Negative for diarrhea.  All other systems reviewed and are negative.    Allergies  Aspirin  Home Medications   Current Outpatient Rx  Name  Route  Sig  Dispense  Refill  . albuterol (PROAIR HFA) 108 (90 BASE) MCG/ACT inhaler   Inhalation   Inhale 2 puffs into the lungs every 6 (six) hours as needed for wheezing or shortness of breath.         Marland Kitchen atorvastatin (LIPITOR) 40 MG tablet   Oral   Take 1 tablet (40 mg total) by mouth daily at 6 PM.   30 tablet   0   . ergocalciferol (VITAMIN D2) 50000 UNITS capsule   Oral  Take 50,000 Units by mouth daily.         Marland Kitchen esomeprazole (NEXIUM) 40 MG capsule   Oral   Take 40 mg by mouth daily.          . feeding supplement (GLUCERNA SHAKE) LIQD   Oral   Take 237 mLs by mouth 3 (three) times daily between meals.   30 Can   2   . folic acid (FOLVITE) 1 MG tablet   Oral   Take 1 mg by mouth daily.         Marland Kitchen glucose monitoring kit (FREESTYLE) monitoring kit   Does not apply   1 each by Does not apply route as needed for other.   1 each   0   . HYDROcodone-acetaminophen (NORCO) 5-325 MG per tablet   Oral   Take 1-2 tablets by mouth every 4 (four) hours as needed for pain.   20 tablet   0   . hyoscyamine (LEVSIN, ANASPAZ) 0.125 MG tablet   Oral   Take 0.125 mg by mouth 4 (four) times daily - after meals and at bedtime.         .  insulin aspart (NOVOLOG FLEXPEN) 100 UNIT/ML SOPN FlexPen   Subcutaneous   Inject 5 Units into the skin 3 (three) times daily with meals.   10 mL   3   . insulin glargine (LANTUS) 100 UNIT/ML injection   Subcutaneous   Inject 0.24 mLs (24 Units total) into the skin at bedtime.   10 mL   12   . lisinopril (PRINIVIL,ZESTRIL) 10 MG tablet   Oral   Take 10 mg by mouth daily.         Marland Kitchen lubiprostone (AMITIZA) 8 MCG capsule   Oral   Take 8 mcg by mouth 2 (two) times daily with a meal.         . megestrol (MEGACE) 40 MG tablet   Oral   Take 40 mg by mouth 2 (two) times daily.         . metoprolol (LOPRESSOR) 50 MG tablet   Oral   Take 1 tablet (50 mg total) by mouth 2 (two) times daily.   60 tablet   0   . Multiple Vitamin (MULTIVITAMIN WITH MINERALS) TABS tablet   Oral   Take 1 tablet by mouth daily.   30 tablet   0   . ondansetron (ZOFRAN-ODT) 4 MG disintegrating tablet   Oral   Take 4 mg by mouth every 6 (six) hours as needed for nausea.         . potassium bicarbonate (K-LYTE) 25 MEQ disintegrating tablet   Oral   Take 25 mEq by mouth daily.          Marland Kitchen thiamine 100 MG tablet   Oral   Take 1 tablet (100 mg total) by mouth daily.   30 tablet   0   . traMADol (ULTRAM) 50 MG tablet   Oral   Take 100 mg by mouth every 6 (six) hours as needed for pain.          Triage Vitals:BP 113/76  Pulse 112  Temp(Src) 98.9 F (37.2 C) (Oral)  Resp 20  Ht 5' (1.524 m)  Wt 94 lb (42.638 kg)  BMI 18.36 kg/m2  SpO2 99%  Physical Exam CONSTITUTIONAL: disheveled, smells ketotic HEAD: Normocephalic/atraumatic EYES: EOMI ENMT: Mucous membranes dry NECK: supple no meningeal signs SPINE:entire spine nontender CV: S1/S2 noted, loud systolic ejection LUNGS: Lungs are  clear to auscultation bilaterally, no apparent distress ABDOMEN: soft, nontender, no rebound or guarding GU:no cva tenderness NEURO: Pt is awake/alert, moves all extremitiesx4 EXTREMITIES: pulses  normal, full ROM SKIN: warm, color normal PSYCH: no abnormalities of mood noted  ED Course  Procedures  DIAGNOSTIC STUDIES: Oxygen Saturation is 99% on RA, normal by my interpretation.    COORDINATION OF CARE: 11:39 PM-Discussed treatment plan which includes labs. Pt agreed to plan.  Pt with vomiting, appears dehydrated and smells ketotic in the setting of ETOH abuse and diabetes Will follow closely 12:41 AM Lactate elevated, though no fever, no elevated WBC, she reports feeling improved and abdominal exam benign.  Will give IV fluids and recheck 3:03 AM Pt resting comfortably Plan is to recheck lactate/BMP after IV fluids 4:42 AM Lactate improved but still >3 She is now hypokalemic Will admit for further rehydration (pt has had minimal urine output) and correction of hypoKalemia She denies current abd pain and her abdominal exam is unremarkable, no focal tenderness D/w dr Curly Rim, will admit for observation/rehydration  Labs Review Labs Reviewed  GLUCOSE, CAPILLARY - Abnormal; Notable for the following:    Glucose-Capillary 289 (*)    All other components within normal limits   Imaging Review No results found.  EKG Interpretation    Date/Time:  Saturday March 18 2013 23:47:03 EST Ventricular Rate:  110 PR Interval:  148 QRS Duration: 68 QT Interval:  356 QTC Calculation: 481 R Axis:   -30 Text Interpretation:  Sinus tachycardia Possible Left atrial enlargement Left axis deviation Possible Lateral infarct , age undetermined Abnormal ECG Confirmed by Christy Gentles  MD, [place] ([id number]) on 03/19/2013 12:16:32 AM           MDM  No diagnosis found. Nursing notes including past medical history and social history reviewed and considered in documentation Labs/vital reviewed and considered Previous records reviewed and considered - h/o ETOH abuse and h/o DKA in the past   I personally performed the services described in this documentation, which was scribed in my  presence. The recorded information has been reviewed and is accurate.      Sharyon Cable, MD 03/19/13 [phone]

## 2013-03-18 NOTE — ED Notes (Signed)
Vomiting since 5pm, blood sugar high. Hasn't taken insulin today

## 2013-03-19 ENCOUNTER — Encounter (HOSPITAL_COMMUNITY): Payer: Self-pay | Admitting: Internal Medicine

## 2013-03-19 DIAGNOSIS — R112 Nausea with vomiting, unspecified: Secondary | ICD-10-CM

## 2013-03-19 DIAGNOSIS — E872 Acidosis, unspecified: Secondary | ICD-10-CM | POA: Diagnosis present

## 2013-03-19 DIAGNOSIS — E43 Unspecified severe protein-calorie malnutrition: Secondary | ICD-10-CM

## 2013-03-19 DIAGNOSIS — E119 Type 2 diabetes mellitus without complications: Secondary | ICD-10-CM

## 2013-03-19 DIAGNOSIS — E1165 Type 2 diabetes mellitus with hyperglycemia: Secondary | ICD-10-CM

## 2013-03-19 DIAGNOSIS — F1010 Alcohol abuse, uncomplicated: Secondary | ICD-10-CM

## 2013-03-19 DIAGNOSIS — R109 Unspecified abdominal pain: Secondary | ICD-10-CM | POA: Diagnosis present

## 2013-03-19 DIAGNOSIS — E876 Hypokalemia: Secondary | ICD-10-CM

## 2013-03-19 DIAGNOSIS — IMO0001 Reserved for inherently not codable concepts without codable children: Secondary | ICD-10-CM

## 2013-03-19 LAB — CBC
HEMATOCRIT: 30.2 % — AB (ref 36.0–46.0)
Hemoglobin: 10.8 g/dL — ABNORMAL LOW (ref 12.0–15.0)
MCH: 32.2 pg (ref 26.0–34.0)
MCHC: 35.8 g/dL (ref 30.0–36.0)
MCV: 90.1 fL (ref 78.0–100.0)
Platelets: 177 10*3/uL (ref 150–400)
RBC: 3.35 MIL/uL — AB (ref 3.87–5.11)
RDW: 12.5 % (ref 11.5–15.5)
WBC: 6.2 10*3/uL (ref 4.0–10.5)

## 2013-03-19 LAB — URINE MICROSCOPIC-ADD ON

## 2013-03-19 LAB — BASIC METABOLIC PANEL
BUN: 11 mg/dL (ref 6–23)
BUN: 11 mg/dL (ref 6–23)
CALCIUM: 7.5 mg/dL — AB (ref 8.4–10.5)
CALCIUM: 8 mg/dL — AB (ref 8.4–10.5)
CO2: 23 meq/L (ref 19–32)
CO2: 24 mEq/L (ref 19–32)
Chloride: 100 mEq/L (ref 96–112)
Chloride: 104 mEq/L (ref 96–112)
Creatinine, Ser: 0.43 mg/dL — ABNORMAL LOW (ref 0.50–1.10)
Creatinine, Ser: 0.46 mg/dL — ABNORMAL LOW (ref 0.50–1.10)
GFR calc Af Amer: >90 mL/min (ref >90)
GFR calc Af Amer: >90 mL/min (ref >90)
GLUCOSE: 124 mg/dL — AB (ref 70–99)
Glucose, Bld: 228 mg/dL — ABNORMAL HIGH (ref 70–99)
Potassium: 2.8 mEq/L — CL (ref 3.7–5.3)
Potassium: 4.6 mEq/L (ref 3.7–5.3)
SODIUM: 142 meq/L (ref 137–147)
Sodium: 139 mEq/L (ref 137–147)

## 2013-03-19 LAB — CBC WITH DIFFERENTIAL/PLATELET
BASOS PCT: 0 % (ref 0–1)
Basophils Absolute: 0 10*3/uL (ref 0.0–0.1)
EOS ABS: 0.1 10*3/uL (ref 0.0–0.7)
Eosinophils Relative: 1 % (ref 0–5)
HCT: 38.2 % (ref 36.0–46.0)
Hemoglobin: 13.6 g/dL (ref 12.0–15.0)
Lymphocytes Relative: 39 % (ref 12–46)
Lymphs Abs: 2.8 10*3/uL (ref 0.7–4.0)
MCH: 31.8 pg (ref 26.0–34.0)
MCHC: 35.6 g/dL (ref 30.0–36.0)
MCV: 89.3 fL (ref 78.0–100.0)
Monocytes Absolute: 0.5 10*3/uL (ref 0.1–1.0)
Monocytes Relative: 7 % (ref 3–12)
NEUTROS PCT: 53 % (ref 43–77)
Neutro Abs: 3.8 10*3/uL (ref 1.7–7.7)
Platelets: 234 10*3/uL (ref 150–400)
RBC: 4.28 MIL/uL (ref 3.87–5.11)
RDW: 12.4 % (ref 11.5–15.5)
WBC: 7.2 10*3/uL (ref 4.0–10.5)

## 2013-03-19 LAB — COMPREHENSIVE METABOLIC PANEL
ALBUMIN: 2.8 g/dL — AB (ref 3.5–5.2)
ALK PHOS: 110 U/L (ref 39–117)
ALK PHOS: 77 U/L (ref 39–117)
ALT: 34 U/L (ref 0–35)
ALT: 49 U/L — AB (ref 0–35)
AST: 28 U/L (ref 0–37)
AST: 36 U/L (ref 0–37)
Albumin: 3.9 g/dL (ref 3.5–5.2)
BUN: 11 mg/dL (ref 6–23)
BUN: 15 mg/dL (ref 6–23)
CALCIUM: 9.6 mg/dL (ref 8.4–10.5)
CO2: 21 mEq/L (ref 19–32)
CO2: 27 mEq/L (ref 19–32)
Calcium: 7.5 mg/dL — ABNORMAL LOW (ref 8.4–10.5)
Chloride: 101 mEq/L (ref 96–112)
Chloride: 89 mEq/L — ABNORMAL LOW (ref 96–112)
Creatinine, Ser: 0.49 mg/dL — ABNORMAL LOW (ref 0.50–1.10)
Creatinine, Ser: 0.51 mg/dL (ref 0.50–1.10)
GFR calc Af Amer: >90 mL/min (ref >90)
GFR calc Af Amer: >90 mL/min (ref >90)
GFR calc non Af Amer: >90 mL/min (ref >90)
GFR calc non Af Amer: >90 mL/min (ref >90)
Glucose, Bld: 236 mg/dL — ABNORMAL HIGH (ref 70–99)
Glucose, Bld: 283 mg/dL — ABNORMAL HIGH (ref 70–99)
POTASSIUM: 3.3 meq/L — AB (ref 3.7–5.3)
POTASSIUM: 3.7 meq/L (ref 3.7–5.3)
SODIUM: 140 meq/L (ref 137–147)
SODIUM: 141 meq/L (ref 137–147)
TOTAL PROTEIN: 5.4 g/dL — AB (ref 6.0–8.3)
TOTAL PROTEIN: 7.2 g/dL (ref 6.0–8.3)
Total Bilirubin: 0.5 mg/dL (ref 0.3–1.2)
Total Bilirubin: 0.5 mg/dL (ref 0.3–1.2)

## 2013-03-19 LAB — GLUCOSE, CAPILLARY
GLUCOSE-CAPILLARY: 213 mg/dL — AB (ref 70–99)
Glucose-Capillary: 213 mg/dL — ABNORMAL HIGH (ref 70–99)
Glucose-Capillary: 116 mg/dL — ABNORMAL HIGH (ref 70–99)
Glucose-Capillary: 197 mg/dL — ABNORMAL HIGH (ref 70–99)
Glucose-Capillary: 334 mg/dL — ABNORMAL HIGH (ref 70–99)

## 2013-03-19 LAB — MAGNESIUM: Magnesium: 1.4 mg/dL — ABNORMAL LOW (ref 1.5–2.5)

## 2013-03-19 LAB — LACTIC ACID, PLASMA
LACTIC ACID, VENOUS: 3.6 mmol/L — AB (ref 0.5–2.2)
LACTIC ACID, VENOUS: 6 mmol/L — AB (ref 0.5–2.2)
Lactic Acid, Venous: 1 mmol/L (ref 0.5–2.2)

## 2013-03-19 LAB — URINALYSIS, ROUTINE W REFLEX MICROSCOPIC
Bilirubin Urine: NEGATIVE
Hgb urine dipstick: NEGATIVE
KETONES UR: 40 mg/dL — AB
Leukocytes, UA: NEGATIVE
NITRITE: NEGATIVE
PROTEIN: NEGATIVE mg/dL
Specific Gravity, Urine: 1.02 (ref 1.005–1.030)
UROBILINOGEN UA: 0.2 mg/dL (ref 0.0–1.0)
pH: 6 (ref 5.0–8.0)

## 2013-03-19 LAB — TROPONIN I: Troponin I: <0.3 ng/mL (ref <0.30)

## 2013-03-19 LAB — LIPASE, BLOOD: LIPASE: 9 U/L — AB (ref 11–59)

## 2013-03-19 MED ORDER — ONDANSETRON HCL 4 MG/2ML IJ SOLN: 4.0000 mg | Freq: Four times a day (QID) | INTRAMUSCULAR | Status: DC | PRN

## 2013-03-19 MED ORDER — INSULIN ASPART 100 UNIT/ML ~~LOC~~ SOLN
0.0000 [IU] | Freq: Every day | SUBCUTANEOUS | Status: DC
  Administered 2013-03-19: 4 [IU] via SUBCUTANEOUS

## 2013-03-19 MED ORDER — MEGESTROL ACETATE 40 MG PO TABS
40.0000 mg | ORAL_TABLET | Freq: Two times a day (BID) | ORAL | Status: DC
  Administered 2013-03-19 – 2013-03-20 (×3): 40 mg via ORAL
  Filled 2013-03-19 (×7): qty 1

## 2013-03-19 MED ORDER — POTASSIUM CHLORIDE CRYS ER 20 MEQ PO TBCR
40.0000 meq | EXTENDED_RELEASE_TABLET | Freq: Once | ORAL | Status: AC
  Administered 2013-03-19: 40 meq via ORAL
  Filled 2013-03-19: qty 2

## 2013-03-19 MED ORDER — ENOXAPARIN SODIUM 30 MG/0.3ML ~~LOC~~ SOLN
30.0000 mg | SUBCUTANEOUS | Status: DC
  Administered 2013-03-20: 30 mg via SUBCUTANEOUS
  Filled 2013-03-19: qty 0.3

## 2013-03-19 MED ORDER — LORAZEPAM 2 MG/ML IJ SOLN: 1.0000 mg | Freq: Four times a day (QID) | INTRAMUSCULAR | Status: DC | PRN

## 2013-03-19 MED ORDER — ENOXAPARIN SODIUM 40 MG/0.4ML ~~LOC~~ SOLN
40.0000 mg | SUBCUTANEOUS | Status: DC
  Administered 2013-03-19: 40 mg via SUBCUTANEOUS
  Filled 2013-03-19: qty 0.4

## 2013-03-19 MED ORDER — SODIUM CHLORIDE 0.9 % IV BOLUS (SEPSIS)
1000.0000 mL | Freq: Once | INTRAVENOUS | Status: AC
  Administered 2013-03-19: 1000 mL via INTRAVENOUS

## 2013-03-19 MED ORDER — LORAZEPAM 1 MG PO TABS
1.0000 mg | ORAL_TABLET | Freq: Four times a day (QID) | ORAL | Status: DC | PRN
  Administered 2013-03-19: 1 mg via ORAL
  Filled 2013-03-19: qty 1

## 2013-03-19 MED ORDER — HYDROCODONE-ACETAMINOPHEN 5-325 MG PO TABS
1.0000 | ORAL_TABLET | ORAL | Status: DC | PRN
  Administered 2013-03-19: 1 via ORAL
  Filled 2013-03-19: qty 1

## 2013-03-19 MED ORDER — HYOSCYAMINE SULFATE 0.125 MG SL SUBL
0.1250 mg | SUBLINGUAL_TABLET | Freq: Three times a day (TID) | SUBLINGUAL | Status: DC
  Administered 2013-03-19 – 2013-03-20 (×5): 0.125 mg via SUBLINGUAL
  Filled 2013-03-19 (×3): qty 1

## 2013-03-19 MED ORDER — MAGNESIUM SULFATE 40 MG/ML IJ SOLN
2.0000 g | Freq: Once | INTRAMUSCULAR | Status: AC
  Administered 2013-03-19: 2 g via INTRAVENOUS
  Filled 2013-03-19: qty 50

## 2013-03-19 MED ORDER — INSULIN ASPART 100 UNIT/ML ~~LOC~~ SOLN
0.0000 [IU] | Freq: Three times a day (TID) | SUBCUTANEOUS | Status: DC
  Administered 2013-03-19: 5 [IU] via SUBCUTANEOUS
  Administered 2013-03-19: 3 [IU] via SUBCUTANEOUS
  Administered 2013-03-20: 5 [IU] via SUBCUTANEOUS
  Administered 2013-03-20: 2 [IU] via SUBCUTANEOUS

## 2013-03-19 MED ORDER — SODIUM CHLORIDE 0.9 % IJ SOLN
3.0000 mL | Freq: Two times a day (BID) | INTRAMUSCULAR | Status: DC
  Administered 2013-03-19 – 2013-03-20 (×2): 3 mL via INTRAVENOUS

## 2013-03-19 MED ORDER — ACETAMINOPHEN 325 MG PO TABS: 650.0000 mg | ORAL_TABLET | Freq: Four times a day (QID) | ORAL | Status: DC | PRN

## 2013-03-19 MED ORDER — INSULIN GLARGINE 100 UNIT/ML ~~LOC~~ SOLN
24.0000 [IU] | Freq: Every day | SUBCUTANEOUS | Status: DC
  Filled 2013-03-19: qty 0.24

## 2013-03-19 MED ORDER — POTASSIUM CHLORIDE IN NACL 20-0.9 MEQ/L-% IV SOLN
INTRAVENOUS | Status: AC
  Filled 2013-03-19: qty 1000

## 2013-03-19 MED ORDER — ACETAMINOPHEN 650 MG RE SUPP: 650.0000 mg | Freq: Four times a day (QID) | RECTAL | Status: DC | PRN

## 2013-03-19 MED ORDER — INSULIN GLARGINE 100 UNIT/ML ~~LOC~~ SOLN
24.0000 [IU] | Freq: Every day | SUBCUTANEOUS | Status: DC
  Administered 2013-03-19: 24 [IU] via SUBCUTANEOUS
  Filled 2013-03-19 (×2): qty 0.24

## 2013-03-19 MED ORDER — THIAMINE HCL 100 MG/ML IJ SOLN: 100.0000 mg | Freq: Every day | INTRAMUSCULAR | Status: DC

## 2013-03-19 MED ORDER — POTASSIUM CHLORIDE 10 MEQ/100ML IV SOLN
10.0000 meq | INTRAVENOUS | Status: AC
  Administered 2013-03-19 (×4): 10 meq via INTRAVENOUS
  Filled 2013-03-19 (×4): qty 100

## 2013-03-19 MED ORDER — ATORVASTATIN CALCIUM 40 MG PO TABS
40.0000 mg | ORAL_TABLET | Freq: Every day | ORAL | Status: DC
  Administered 2013-03-19: 40 mg via ORAL
  Filled 2013-03-19: qty 1

## 2013-03-19 MED ORDER — HYDROMORPHONE HCL PF 1 MG/ML IJ SOLN: 0.5000 mg | INTRAMUSCULAR | Status: DC | PRN

## 2013-03-19 MED ORDER — POTASSIUM CHLORIDE IN NACL 20-0.9 MEQ/L-% IV SOLN
INTRAVENOUS | Status: DC
  Administered 2013-03-19: 1000 mL via INTRAVENOUS
  Administered 2013-03-19 – 2013-03-20 (×2): via INTRAVENOUS

## 2013-03-19 MED ORDER — HYOSCYAMINE SULFATE 0.125 MG PO TABS
0.1250 mg | ORAL_TABLET | Freq: Three times a day (TID) | ORAL | Status: DC
  Administered 2013-03-19: 0.125 mg via ORAL
  Filled 2013-03-19 (×6): qty 1

## 2013-03-19 MED ORDER — ADULT MULTIVITAMIN W/MINERALS CH
1.0000 | ORAL_TABLET | Freq: Every day | ORAL | Status: DC
  Administered 2013-03-19 – 2013-03-20 (×2): 1 via ORAL
  Filled 2013-03-19 (×2): qty 1

## 2013-03-19 MED ORDER — VITAMIN B-1 100 MG PO TABS
100.0000 mg | ORAL_TABLET | Freq: Every day | ORAL | Status: DC
  Administered 2013-03-19 – 2013-03-20 (×2): 100 mg via ORAL
  Filled 2013-03-19 (×2): qty 1

## 2013-03-19 MED ORDER — PANTOPRAZOLE SODIUM 40 MG IV SOLR
40.0000 mg | Freq: Two times a day (BID) | INTRAVENOUS | Status: DC
  Administered 2013-03-19 – 2013-03-20 (×3): 40 mg via INTRAVENOUS
  Filled 2013-03-19 (×4): qty 40

## 2013-03-19 MED ORDER — FOLIC ACID 1 MG PO TABS
1.0000 mg | ORAL_TABLET | Freq: Every day | ORAL | Status: DC
  Administered 2013-03-19 – 2013-03-20 (×2): 1 mg via ORAL
  Filled 2013-03-19 (×2): qty 1

## 2013-03-19 MED ORDER — ONDANSETRON HCL 4 MG PO TABS
4.0000 mg | ORAL_TABLET | Freq: Four times a day (QID) | ORAL | Status: DC | PRN
Start: 2013-03-19 — End: 2013-03-20

## 2013-03-19 NOTE — ED Notes (Signed)
Woke pt for blood draw and escorted her to bathroom. Voided QS. Denies pain or nausea

## 2013-03-19 NOTE — Progress Notes (Addendum)
[facility] PROGRESS NOTE Interim History: 58 y.o. female with a past medical history of alcohol abuse, diabetes type II, hypertension, who was in her usual state of health till about 3 days ago, when she started having nausea, followed by onset of vomiting. She has thrown up multiple times. She's thrown up food material. Denies any blood in the emesis. Denies any diarrhea. She's had upper abdominal pain on and off for the last one week. Pain is currently about 3/10 in intensity. However, this pain is also chronic in nature. She does have a history of chronic pancreatitis. Denies any sick contacts.  Assessment/Plan: Nausea & vomiting/ H/O chronic pancreatitis/  Dehydration: - ? Due to acute pancreatitis. - IV fluids, narcotics and PPI. - Start clear liq diet.  Lactic acidosis: - Most likely due to alcohol intake. Lactic acid, has improved with IV hydration.  Severe protein caloric malnutrition: - ensure tid.  Type II or unspecified type diabetes mellitus without mention of complication, uncontrolled: - Lantus now and cover with sliding scale coverage.  - HbA1c was 9.5 in September 2014.  - Anion gap is mildly elevated.   Alcohol abuse: - CIWA protocol. Thiamine and multivitamins will be provided  Hypokalemia: - due to N&V replete, Mag. check low replete    DVT Prophylaxis: Lovenox  Code Status: Full code  Family Communication: Discussed with the patient  Disposition Plan: Observe the telemetry     Consultants:  none  Procedures:  none  Antibiotics:  None  HPI/Subjective: abd pain improved. Wants to eat.  Objective: Filed Vitals:   03/19/13 0200 03/19/13 0300 03/19/13 0500 03/19/13 0635  BP: 100/58 102/58 125/68 126/62  Pulse: 105 106  103  Temp:    98.7 F (37.1 C)  TempSrc:    Oral  Resp:    18  Height:      Weight:      SpO2: 95% 93%  99%    Intake/Output Summary (Last 24 hours) at 03/19/13 1132 Last data filed at 03/19/13 1023  Gross per 24  hour  Intake    480 ml  Output      0 ml  Net    480 ml   Filed Weights   03/18/13 2326  Weight: 42.638 kg (94 lb)    Exam:  General: Alert, awake, oriented x3, in no acute distress.  HEENT: No bruits, no goiter.  Heart: Regular rate and rhythm, without murmurs, rubs, gallops.  Lungs: Good air movement, bilateral air movement.  Abdomen: Soft, mild epigastric tenderness, nondistended, positive bowel sounds.     Data Reviewed: Basic Metabolic Panel:  Recent Labs Lab 03/19/13 0001 03/19/13 0345 03/19/13 0706  NA 140 142 141  K 3.3* 2.8* 3.7  CL 89* 100 101  CO2 27 23 21   GLUCOSE 283* 228* 236*  BUN 15 11 11   CREATININE 0.51 0.43* 0.49*  CALCIUM 9.6 7.5* 7.5*  MG  --  1.4*  --    Liver Function Tests:  Recent Labs Lab 03/19/13 0001 03/19/13 0706  AST 36 28  ALT 49* 34  ALKPHOS 110 77  BILITOT 0.5 0.5  PROT 7.2 5.4*  ALBUMIN 3.9 2.8*    Recent Labs Lab 03/19/13 0001  LIPASE 9*   No results found for this basename: AMMONIA,  in the last 168 hours CBC:  Recent Labs Lab 03/19/13 0001 03/19/13 0706  WBC 7.2 6.2  NEUTROABS 3.8  --   HGB 13.6 10.8*  HCT 38.2 30.2*  MCV 89.3 90.1  PLT 234 177   Cardiac Enzymes:  Recent Labs Lab 03/19/13 0001  TROPONINI <0.30   BNP (last 3 results)  Recent Labs  09/24/12 1958  PROBNP 38.8   CBG:  Recent Labs Lab 03/18/13 2330 03/19/13 0721  GLUCAP 289* 213*    No results found for this or any previous visit (from the past 240 hour(s)).   Studies: No results found.  Scheduled Meds: . atorvastatin  40 mg Oral q1800  . enoxaparin (LOVENOX) injection  40 mg Subcutaneous Q24H  . folic acid  1 mg Oral Daily  . hyoscyamine  0.125 mg Sublingual TID PC & HS  . insulin aspart  0-15 Units Subcutaneous TID WC  . insulin aspart  0-5 Units Subcutaneous QHS  . insulin glargine  24 Units Subcutaneous QHS  . megestrol  40 mg Oral BID  . multivitamin with minerals  1 tablet Oral Daily  . pantoprazole  (PROTONIX) IV  40 mg Intravenous Q12H  . sodium chloride  3 mL Intravenous Q12H  . thiamine  100 mg Oral Daily   Or  . thiamine  100 mg Intravenous Daily   Continuous Infusions: . 0.9 % NaCl with KCl 20 mEq / L 1,000 mL (03/19/13 0535)     Charlynne Cousins  [facility] Pager [phone]. If 8PM-8AM, please contact night-coverage at [email or web address], password [facility] 03/19/2013, 11:32 AM  LOS: 1 day

## 2013-03-19 NOTE — ED Notes (Signed)
Sleeping soundly 

## 2013-03-19 NOTE — ED Notes (Signed)
CRITICAL VALUE ALERT  Critical value received:  k2.8  Date of notification:  03/19/13  Time of notification:  0415  Critical value read back:yes  Nurse who received alert:  smoore  MD notified (1st page):  wickline  Time of first page:  0416  MD notified (2nd page):  Time of second page:  Responding MD:  wickline Time MD responded:  [phone]

## 2013-03-19 NOTE — H&P (Signed)
[facility] History and Physical  ALLEXUS OVENS UVO:[id number] DOB: 21-Sep-1955 DOA: 03/18/2013   PCP: Octavio Graves, DO  Specialists: None  Chief Complaint: Nausea, vomiting since 3 days  HPI: AZILEE PIRRO is a 58 y.o. female with a past medical history of alcohol abuse, diabetes type II, hypertension, who was in her usual state of health till about 3 days ago, when she started having nausea, followed by onset of vomiting. She has thrown up multiple times. She's thrown up food material. Denies any blood in the emesis. Denies any diarrhea. She's had upper abdominal pain on and off for the last one week. Pain is currently about 3/10 in intensity. However, this pain is also chronic in nature. She does have a history of chronic pancreatitis. Denies any sick contacts. She says that she drinks quite heavily. She has a pint of vodka every 2 days. Last drink was 5 AM on Saturday morning. Denies any fever, chills. Denies any urinary complaints. She has been dizzy, especially when she stands up suddenly. Denies any syncopal episode. After receiving IV fluids in the emergency department she feels better. However, she still has significantly low potassium and, so, we were called for admission.  Home Medications: Prior to Admission medications   Medication Sig Start Date End Date Taking? Authorizing Provider  albuterol (PROAIR HFA) 108 (90 BASE) MCG/ACT inhaler Inhale 2 puffs into the lungs every 6 (six) hours as needed for wheezing or shortness of breath.    Historical Provider, MD  atorvastatin (LIPITOR) 40 MG tablet Take 1 tablet (40 mg total) by mouth daily at 6 PM. 10/29/12   Nishant Dhungel, MD  ergocalciferol (VITAMIN D2) 50000 UNITS capsule Take 50,000 Units by mouth daily.    Historical Provider, MD  esomeprazole (NEXIUM) 40 MG capsule Take 40 mg by mouth daily.     Historical Provider, MD  feeding supplement (GLUCERNA SHAKE) LIQD Take 237 mLs by mouth 3 (three) times daily between  meals. 10/29/12   Nishant Dhungel, MD  folic acid (FOLVITE) 1 MG tablet Take 1 mg by mouth daily.    Historical Provider, MD  glucose monitoring kit (FREESTYLE) monitoring kit 1 each by Does not apply route as needed for other. 10/29/12   Nishant Dhungel, MD  HYDROcodone-acetaminophen (NORCO) 5-325 MG per tablet Take 1-2 tablets by mouth every 4 (four) hours as needed for pain. 12/07/12   Ephraim Hamburger, MD  hyoscyamine (LEVSIN, ANASPAZ) 0.125 MG tablet Take 0.125 mg by mouth 4 (four) times daily - after meals and at bedtime.    Historical Provider, MD  insulin aspart (NOVOLOG FLEXPEN) 100 UNIT/ML SOPN FlexPen Inject 5 Units into the skin 3 (three) times daily with meals. 10/29/12   Nishant Dhungel, MD  insulin glargine (LANTUS) 100 UNIT/ML injection Inject 0.24 mLs (24 Units total) into the skin at bedtime. 10/29/12   Nishant Dhungel, MD  lisinopril (PRINIVIL,ZESTRIL) 10 MG tablet Take 10 mg by mouth daily.    Historical Provider, MD  lubiprostone (AMITIZA) 8 MCG capsule Take 8 mcg by mouth 2 (two) times daily with a meal.    Historical Provider, MD  megestrol (MEGACE) 40 MG tablet Take 40 mg by mouth 2 (two) times daily.    Historical Provider, MD  metoprolol (LOPRESSOR) 50 MG tablet Take 1 tablet (50 mg total) by mouth 2 (two) times daily. 10/29/12   Nishant Dhungel, MD  Multiple Vitamin (MULTIVITAMIN WITH MINERALS) TABS tablet Take 1 tablet by mouth daily. 10/29/12   Louellen Molder, MD  ondansetron (ZOFRAN-ODT) 4 MG disintegrating tablet Take 4 mg by mouth every 6 (six) hours as needed for nausea.    Historical Provider, MD  potassium bicarbonate (K-LYTE) 25 MEQ disintegrating tablet Take 25 mEq by mouth daily.     Historical Provider, MD  thiamine 100 MG tablet Take 1 tablet (100 mg total) by mouth daily. 10/29/12   Nishant Dhungel, MD  traMADol (ULTRAM) 50 MG tablet Take 100 mg by mouth every 6 (six) hours as needed for pain.    Historical Provider, MD    Allergies:  Allergies  Allergen  Reactions  . Aspirin Palpitations    Past Medical History: Past Medical History  Diagnosis Date  . Chronic diarrhea   . History of kidney stones   . Heart murmur   . Neuropathy     Hx: of  . GERD (gastroesophageal reflux disease)   . Diabetes mellitus     fasting blood sugar 110-120s    Past Surgical History  Procedure Laterality Date  . Colonoscopy  02/24/2010  . Tubal ligation    . Multiple extractions with alveoloplasty N/A 06/13/2012    Procedure: MULTIPLE EXTRACION WITH ALVEOLOPLASTY EXTRACT: 18, 19, 20, 21, 22, 24, 25, 27, 28, 29, 30, 31;  Surgeon: Gae Bon, DDS;  Location: [facility];  Service: Oral Surgery;  Laterality: N/A;    Social History: Patient lives with her son. She denies smoking. Alcohol use as noted in history of present illness. No illicit drug use. Independent with daily activities.  Family History:  Family History  Problem Relation Age of Onset  . Diabetes Sister      Review of Systems - History obtained from the patient General ROS: positive for  - fatigue Psychological ROS: negative Ophthalmic ROS: negative ENT ROS: negative Allergy and Immunology ROS: negative Hematological and Lymphatic ROS: negative Endocrine ROS: negative Respiratory ROS: no cough, shortness of breath, or wheezing Cardiovascular ROS: no chest pain or dyspnea on exertion Gastrointestinal ROS: as in hpi Genito-Urinary ROS: no dysuria, trouble voiding, or hematuria Musculoskeletal ROS: negative Neurological ROS: no TIA or stroke symptoms Dermatological ROS: negative  Physical Examination  Filed Vitals:   03/19/13 0101 03/19/13 0155 03/19/13 0200 03/19/13 0300  BP: 120/66 95/52 100/58 102/58  Pulse: 103 108 105 106  Temp:      TempSrc:      Resp: 14 25    Height:      Weight:      SpO2: 96% 96% 95% 93%    General appearance: alert, cooperative, appears stated age, cachectic and no distress Head: Normocephalic, without obvious abnormality, atraumatic Eyes:  conjunctivae/corneas clear. PERRL, EOM's intact. Throat: Dry mm Neck: no adenopathy, no carotid bruit, no JVD, supple, symmetrical, trachea midline and thyroid not enlarged, symmetric, no tenderness/mass/nodules Resp: clear to auscultation bilaterally Cardio: S1-S2, slightly tachycardic. Regular. No S3, S4. No rubs, or bruit. Systolic murmur appreciated over the precordium. GI: soft, non-tender; bowel sounds normal; no masses,  no organomegaly Extremities: extremities normal, atraumatic, no cyanosis or edema Pulses: 2+ and symmetric Skin: Skin color, texture, turgor normal. No rashes or lesions Lymph nodes: Cervical, supraclavicular, and axillary nodes normal. Neurologic: She is alert and oriented x3. Cranial nerves II through XII are intact. Motor strength is equal, bilateral upper and lower extremities. Gait was not assessed. No focal deficits overall.  Laboratory Data: Results for orders placed during the hospital encounter of 03/18/13 (from the past 48 hour(s))  GLUCOSE, CAPILLARY     Status: Abnormal   Collection  Time    03/18/13 11:30 PM      Result Value Range   Glucose-Capillary 289 (*) 70 - 99 mg/dL   Comment 1 Documented in Chart     Comment 2 Notify RN    CBC WITH DIFFERENTIAL     Status: None   Collection Time    03/19/13 12:01 AM      Result Value Range   WBC 7.2  4.0 - 10.5 K/uL   RBC 4.28  3.87 - 5.11 MIL/uL   Hemoglobin 13.6  12.0 - 15.0 g/dL   HCT 38.2  36.0 - 46.0 %   MCV 89.3  78.0 - 100.0 fL   MCH 31.8  26.0 - 34.0 pg   MCHC 35.6  30.0 - 36.0 g/dL   RDW 12.4  11.5 - 15.5 %   Platelets 234  150 - 400 K/uL   Neutrophils Relative % 53  43 - 77 %   Neutro Abs 3.8  1.7 - 7.7 K/uL   Lymphocytes Relative 39  12 - 46 %   Lymphs Abs 2.8  0.7 - 4.0 K/uL   Monocytes Relative 7  3 - 12 %   Monocytes Absolute 0.5  0.1 - 1.0 K/uL   Eosinophils Relative 1  0 - 5 %   Eosinophils Absolute 0.1  0.0 - 0.7 K/uL   Basophils Relative 0  0 - 1 %   Basophils Absolute 0.0  0.0 -  0.1 K/uL  COMPREHENSIVE METABOLIC PANEL     Status: Abnormal   Collection Time    03/19/13 12:01 AM      Result Value Range   Sodium 140  137 - 147 mEq/L   Potassium 3.3 (*) 3.7 - 5.3 mEq/L   Chloride 89 (*) 96 - 112 mEq/L   CO2 27  19 - 32 mEq/L   Glucose, Bld 283 (*) 70 - 99 mg/dL   BUN 15  6 - 23 mg/dL   Creatinine, Ser 0.51  0.50 - 1.10 mg/dL   Calcium 9.6  8.4 - 10.5 mg/dL   Total Protein 7.2  6.0 - 8.3 g/dL   Albumin 3.9  3.5 - 5.2 g/dL   AST 36  0 - 37 U/L   ALT 49 (*) 0 - 35 U/L   Alkaline Phosphatase 110  39 - 117 U/L   Total Bilirubin 0.5  0.3 - 1.2 mg/dL   GFR calc non Af Amer >90  >90 mL/min   GFR calc Af Amer >90  >90 mL/min   Comment: (NOTE)     The eGFR has been calculated using the CKD EPI equation.     This calculation has not been validated in all clinical situations.     eGFR's persistently <90 mL/min signify possible Chronic Kidney     Disease.  LIPASE, BLOOD     Status: Abnormal   Collection Time    03/19/13 12:01 AM      Result Value Range   Lipase 9 (*) 11 - 59 U/L  TROPONIN I     Status: None   Collection Time    03/19/13 12:01 AM      Result Value Range   Troponin I <0.30  <0.30 ng/mL   Comment:            Due to the release kinetics of cTnI,     a negative result within the first hours     of the onset of symptoms does not rule out  myocardial infarction with certainty.     If myocardial infarction is still suspected,     repeat the test at appropriate intervals.  LACTIC ACID, PLASMA     Status: Abnormal   Collection Time    03/19/13 12:01 AM      Result Value Range   Lactic Acid, Venous 6.0 (*) 0.5 - 2.2 mmol/L  BASIC METABOLIC PANEL     Status: Abnormal   Collection Time    03/19/13  3:45 AM      Result Value Range   Sodium 142  137 - 147 mEq/L   Potassium 2.8 (*) 3.7 - 5.3 mEq/L   Comment: CRITICAL RESULT CALLED TO, READ BACK BY AND VERIFIED WITH:      MOORE,S @ 0414 ON 03/19/13 BY WOODIE,J   Chloride 100  96 - 112 mEq/L   CO2 23   19 - 32 mEq/L   Glucose, Bld 228 (*) 70 - 99 mg/dL   BUN 11  6 - 23 mg/dL   Creatinine, Ser 0.43 (*) 0.50 - 1.10 mg/dL   Calcium 7.5 (*) 8.4 - 10.5 mg/dL   GFR calc non Af Amer >90  >90 mL/min   GFR calc Af Amer >90  >90 mL/min   Comment: (NOTE)     The eGFR has been calculated using the CKD EPI equation.     This calculation has not been validated in all clinical situations.     eGFR's persistently <90 mL/min signify possible Chronic Kidney     Disease.  LACTIC ACID, PLASMA     Status: Abnormal   Collection Time    03/19/13  3:45 AM      Result Value Range   Lactic Acid, Venous 3.6 (*) 0.5 - 2.2 mmol/L  URINALYSIS, ROUTINE W REFLEX MICROSCOPIC     Status: Abnormal   Collection Time    03/19/13  4:02 AM      Result Value Range   Color, Urine YELLOW  YELLOW   APPearance CLEAR  CLEAR   Specific Gravity, Urine 1.020  1.005 - 1.030   pH 6.0  5.0 - 8.0   Glucose, UA >1000 (*) NEGATIVE mg/dL   Hgb urine dipstick NEGATIVE  NEGATIVE   Bilirubin Urine NEGATIVE  NEGATIVE   Ketones, ur 40 (*) NEGATIVE mg/dL   Protein, ur NEGATIVE  NEGATIVE mg/dL   Urobilinogen, UA 0.2  0.0 - 1.0 mg/dL   Nitrite NEGATIVE  NEGATIVE   Leukocytes, UA NEGATIVE  NEGATIVE  URINE MICROSCOPIC-ADD ON     Status: None   Collection Time    03/19/13  4:02 AM      Result Value Range   Squamous Epithelial / LPF RARE  RARE   WBC, UA 0-2  <3 WBC/hpf   RBC / HPF 0-2  <3 RBC/hpf   Bacteria, UA RARE  RARE    Radiology Reports: No results found.  Electrocardiogram: Sinus tachycardia, and 10 beats per minute. Left axis deviation. No Q waves. No concerning ST or T-wave changes.  Problem List  Principal Problem:   Nausea & vomiting Active Problems:   Alcohol abuse   Hypokalemia   H/O chronic pancreatitis   Dehydration   Type II or unspecified type diabetes mellitus without mention of complication, uncontrolled   Lactic acidosis   Assessment: This is a 58 year old, African American female, who presents with  nausea, vomiting, and abdominal pain for the last 3 days. She's found to have lactic acidosis. She is hyperglycemic. She also has dehydration  and hypokalemia. All this is explained by her alcohol intoxication and abuse.  Plan: #1 nausea, vomiting, and abdominal pain: Most likely secondary to alcohol intake. CT Abdomen in October of 2014 showed evidence for chronic pancreatitis. At this time abdomen is completely benign. LFTs are mildly abnormal. Lipase is normal. Treat symptomatically. Clear liquids. No need for further imaging studies.  #2 lactic acidosis: Most likely due to alcohol intake. Lactic acid, has improved with IV hydration. We'll continue to hydrate and repeat the value this afternoon.  #3 hyperglycemia in the setting of type 2 diabetes: She last took her Lantus insulin the day before yesterday. We will give her a dose of Lantus now and cover with sliding scale coverage. HbA1c was 9.5 in September 2014. Anion gap is mildly elevated. However I think this is mostly driven by her alcohol intake and dehydration. No clear indication for intravenous insulin at this time.  #4 severe hypokalemia: This will be repleted aggressively orally and intravenously. Check magnesium level. Repeat labs this afternoon.  #5 history of weight loss: Apparently, her primary care physician is addressing this. This is most likely related to her alcohol use and chronic pancreatitis.  #6 had alcohol abuse: We will initiate CIWA protocol. Thiamine and multivitamins will be provided. Counseling has been provided.  DVT Prophylaxis: Lovenox Code Status: Full code Family Communication: Discussed with the patient  Disposition Plan: Observe the telemetry   Further management decisions will depend on results of further testing and patient's response to treatment.  [facility]  [facility] Pager [phone]  If 7PM-7AM, please contact night-coverage [email or web address] Password [facility]  03/19/2013, 4:58 AM

## 2013-03-19 NOTE — Care Management Utilization Note (Signed)
Ur complete    Erica [name] [id number]

## 2013-03-20 DIAGNOSIS — E860 Dehydration: Principal | ICD-10-CM

## 2013-03-20 DIAGNOSIS — R109 Unspecified abdominal pain: Secondary | ICD-10-CM

## 2013-03-20 DIAGNOSIS — F10939 Alcohol use, unspecified with withdrawal, unspecified: Secondary | ICD-10-CM

## 2013-03-20 DIAGNOSIS — F10239 Alcohol dependence with withdrawal, unspecified: Secondary | ICD-10-CM

## 2013-03-20 DIAGNOSIS — R197 Diarrhea, unspecified: Secondary | ICD-10-CM

## 2013-03-20 DIAGNOSIS — R7989 Other specified abnormal findings of blood chemistry: Secondary | ICD-10-CM

## 2013-03-20 LAB — GLUCOSE, CAPILLARY
Glucose-Capillary: 209 mg/dL — ABNORMAL HIGH (ref 70–99)
Glucose-Capillary: 137 mg/dL — ABNORMAL HIGH (ref 70–99)

## 2013-03-20 LAB — MAGNESIUM: MAGNESIUM: 1.8 mg/dL (ref 1.5–2.5)

## 2013-03-20 MED ORDER — ONDANSETRON 4 MG PO TBDP: 4.0000 mg | ORAL_TABLET | Freq: Four times a day (QID) | ORAL | Status: DC | PRN

## 2013-03-20 MED ORDER — METOPROLOL TARTRATE 25 MG PO TABS: 25.0000 mg | ORAL_TABLET | Freq: Two times a day (BID) | ORAL | Status: DC

## 2013-03-20 MED ORDER — TRAMADOL HCL 50 MG PO TABS: 100.0000 mg | ORAL_TABLET | Freq: Four times a day (QID) | ORAL | Status: DC | PRN

## 2013-03-20 MED ORDER — LISINOPRIL 10 MG PO TABS: 5.0000 mg | ORAL_TABLET | Freq: Every day | ORAL | Status: DC

## 2013-03-20 NOTE — Discharge Summary (Signed)
Physician Discharge Summary  Patient ID: Erica [name] MRN: [id number] DOB/AGE: [birth year] 58 y.o.  Admit date: 03/18/2013 Discharge date: 03/20/2013  Primary Care Physician:  Octavio Graves, DO  Discharge Diagnoses:    . Hypokalemia- resolved . Dehydration with orthostasis- improved . Alcohol abuse . Non compliance . Nausea & vomiting- resolved  . Lactic acidosis . Type II or unspecified type diabetes mellitus without mention of complication, uncontrolled . Chronic diarrhea . Chronic Abdominal pain due to possible chronic pancreatitis  Consults: None   Recommendations for Outpatient Follow-up:  Please follow patient's BP and readjust her medications accordingly. Her BP was borderline low on admission hence both metoprolol and lisinopril were reduced.    Allergies:   Allergies  Allergen Reactions  . Aspirin Palpitations     Discharge Medications:   Medication List         atorvastatin 40 MG tablet  Commonly known as:  LIPITOR  Take 1 tablet (40 mg total) by mouth daily at 6 PM.     ergocalciferol 50000 UNITS capsule  Commonly known as:  VITAMIN D2  Take 50,000 Units by mouth daily.     esomeprazole 40 MG capsule  Commonly known as:  NEXIUM  Take 40 mg by mouth daily.     feeding supplement (GLUCERNA SHAKE) Liqd  Take 237 mLs by mouth 3 (three) times daily between meals.     folic acid 1 MG tablet  Commonly known as:  FOLVITE  Take 1 mg by mouth daily.     glucose monitoring kit monitoring kit  1 each by Does not apply route as needed for other.     HYDROcodone-acetaminophen 5-325 MG per tablet  Commonly known as:  NORCO  Take 1-2 tablets by mouth every 4 (four) hours as needed for pain.     hyoscyamine 0.125 MG tablet  Commonly known as:  LEVSIN, ANASPAZ  Take 0.125 mg by mouth 4 (four) times daily - after meals and at bedtime.     insulin aspart 100 UNIT/ML FlexPen  Commonly known as:  NOVOLOG FLEXPEN  Inject 5 Units into the skin 3  (three) times daily with meals.     insulin glargine 100 UNIT/ML injection  Commonly known as:  LANTUS  Inject 0.24 mLs (24 Units total) into the skin at bedtime.     lisinopril 10 MG tablet  Commonly known as:  PRINIVIL,ZESTRIL  Take 0.5 tablets (5 mg total) by mouth daily.     lubiprostone 8 MCG capsule  Commonly known as:  AMITIZA  Take 8 mcg by mouth 2 (two) times daily with a meal.     megestrol 40 MG tablet  Commonly known as:  MEGACE  Take 40 mg by mouth 2 (two) times daily.     metoprolol tartrate 25 MG tablet  Commonly known as:  LOPRESSOR  Take 1 tablet (25 mg total) by mouth 2 (two) times daily.     multivitamin with minerals Tabs tablet  Take 1 tablet by mouth daily.     ondansetron 4 MG disintegrating tablet  Commonly known as:  ZOFRAN-ODT  Take 1 tablet (4 mg total) by mouth every 6 (six) hours as needed for nausea.     potassium bicarbonate 25 MEQ disintegrating tablet  Commonly known as:  K-LYTE  Take 25 mEq by mouth daily.     PROAIR HFA 108 (90 BASE) MCG/ACT inhaler  Generic drug:  albuterol  Inhale 2 puffs into the lungs every 6 (six) hours as needed for  wheezing or shortness of breath.     thiamine 100 MG tablet  Take 1 tablet (100 mg total) by mouth daily.     traMADol 50 MG tablet  Commonly known as:  ULTRAM  Take 2 tablets (100 mg total) by mouth every 6 (six) hours as needed.         Brief H and P: For complete details please refer to admission H and P, but in briefAlpheia B [name] is a 58 y.o. female with a past medical history of alcohol abuse, diabetes type II, hypertension, who was in her usual state of health till about 3 days ago, when she started having nausea, followed by onset of vomiting. She had thrown up multiple times. She's thrown up food material. Denied any blood in the emesis. Denied any diarrhea. She's had upper abdominal pain on and off for the last one week, about 3/10 in intensity. However, this pain is also chronic in  nature. She does have a history of chronic pancreatitis. Denied any sick contacts. She reported that she drinks quite heavily. She has a pint of vodka every 2 days. Last drink was 5 AM on Saturday morning. Denied any fever, chills. Denied any urinary complaints. She had been dizzy, especially when she stood up suddenly. Denies any syncopal episode. After receiving IV fluids in the emergency department she feels better. However, she still has significantly low potassium and, so, we were called for admission.   Hospital Course:  58 y.o. female with a past medical history of alcohol abuse, diabetes type II, hypertension, who was in her usual state of health till about 3 days ago, when she started having nausea, followed by onset of vomiting, several episodes. She denied any hematemesis. She denied any diarrhea. She's had upper abdominal pain on and off for the last one week. Pain is currently about 3/10 in intensity. However, this pain is also chronic in nature. She does have a history of chronic pancreatitis  Nausea & vomiting/ H/O chronic pancreatitis/ Dehydration: Improved, patient is currently tolerating solid oral diet without any difficulty. Patient was placed on IV fluid hydration, pain control and PPI. Initially she was placed on clear liquid diet and advanced to solids as tolerated.  Lactic acidosis: - Most likely due to alcohol intake and dehydration. Lactic acid, has improved with IV hydration.   Severe protein caloric malnutrition: - ensure tid. Patient was recommended to continue appetite stimulant and encourage oral diet. Patient was also strongly recommended alcohol cessation. Patient reports that she has outpatient workup in progress for anorexia with her PCP.  Type II or unspecified type diabetes mellitus without mention of complication, uncontrolled: Patient was continued on sliding scale insulin and Lantus. Anion gap was mildly elevated likely due to dehydration, nausea and  vomiting.  Alcohol abuse: Patient was placed on CIWA protocol. Thiamine and multivitamins provider, which was strongly recommended to stop alcohol he use especially with her history of chronic pancreatitis.  Chronic pancreatitis: Patient was recommended to stop drinking alcohol, encouraged oral diet, she'll continue pain medications as needed.   Hypokalemia: - Due to nausea and vomiting resolved  History of hypertension: patient's BP was borderline hypotensive likely due to acute dehydration. Beta blocker and lisinopril dose was reduced to half. Patient will followup with her PCP and readjust medications accordingly.  Day of Discharge BP 133/68  Pulse 85  Temp(Src) 97.5 F (36.4 C) (Oral)  Resp 18  Ht 5' (1.524 m)  Wt 42.638 kg (94 lb)  BMI 18.36  kg/m2  SpO2 100%  Physical Exam: General: Alert and awake oriented x3 not in any acute distress, thin and frail appearing  CVS: S1-S2 clear no murmur rubs or gallops Chest: clear to auscultation bilaterally, no wheezing rales or rhonchi Abdomen: soft nontender, nondistended, normal bowel sounds Extremities: no cyanosis, clubbing or edema noted bilaterally Neuro: Cranial nerves II-XII intact, no focal neurological deficits   The results of significant diagnostics from this hospitalization (including imaging, microbiology, ancillary and laboratory) are listed below for reference.    LAB RESULTS: Basic Metabolic Panel:  Recent Labs Lab 03/19/13 0706 03/19/13 1201 03/20/13 0549  NA 141 139  --   K 3.7 4.6  --   CL 101 104  --   CO2 21 24  --   GLUCOSE 236* 124*  --   BUN 11 11  --   CREATININE 0.49* 0.46*  --   CALCIUM 7.5* 8.0*  --   MG  --   --  1.8   Liver Function Tests:  Recent Labs Lab 03/19/13 0001 03/19/13 0706  AST 36 28  ALT 49* 34  ALKPHOS 110 77  BILITOT 0.5 0.5  PROT 7.2 5.4*  ALBUMIN 3.9 2.8*    Recent Labs Lab 03/19/13 0001  LIPASE 9*   No results found for this basename: AMMONIA,  in the last  168 hours CBC:  Recent Labs Lab 03/19/13 0001 03/19/13 0706  WBC 7.2 6.2  NEUTROABS 3.8  --   HGB 13.6 10.8*  HCT 38.2 30.2*  MCV 89.3 90.1  PLT 234 177   Cardiac Enzymes:  Recent Labs Lab 03/19/13 0001  TROPONINI <0.30   BNP: No components found with this basename: POCBNP,  CBG:  Recent Labs Lab 03/19/13 2048 03/20/13 0736  GLUCAP 334* 209*    Significant Diagnostic Studies:  No results found.    Disposition and Follow-up:     Discharge Orders   Future Orders Complete By Expires   Diet Carb Modified  As directed    Discharge instructions  As directed    Comments:     Please note that your BP medications are cut down to their half dose.  Please stay hydrated, eat well and discuss which her primary doctor about your BP meds if they need to be adjusted again at follow-up appointment.   Increase activity slowly  As directed        DISPOSITION: Home  DIET: Carb modified diet    DISCHARGE FOLLOW-UP Follow-up Information   Follow up with [place], Randall, DO. Schedule an appointment as soon as possible for a visit in 10 days. (for hospital follow-up)    Contact information:   [address] [address] [phone]       Time spent on Discharge: 35 minutes  Signed:   RAI,RIPUDEEP M.D. [facility] 03/20/2013, 11:00 AM Pager: [phone]

## 2013-04-12 ENCOUNTER — Other Ambulatory Visit (HOSPITAL_COMMUNITY): Payer: Self-pay | Admitting: Internal Medicine

## 2013-05-08 ENCOUNTER — Other Ambulatory Visit (HOSPITAL_COMMUNITY): Payer: Self-pay | Admitting: Internal Medicine

## 2013-05-23 ENCOUNTER — Encounter (HOSPITAL_COMMUNITY): Payer: Self-pay | Admitting: Emergency Medicine

## 2013-05-23 ENCOUNTER — Emergency Department (HOSPITAL_COMMUNITY)
Admission: EM | Admit: 2013-05-23 | Discharge: 2013-05-24 | Disposition: A | Discharge status: Home / Self Care | Payer: Medicaid Other | Attending: Emergency Medicine | Admitting: Emergency Medicine

## 2013-05-23 DIAGNOSIS — R739 Hyperglycemia, unspecified: Secondary | ICD-10-CM

## 2013-05-23 DIAGNOSIS — R1031 Right lower quadrant pain: Secondary | ICD-10-CM | POA: Insufficient documentation

## 2013-05-23 DIAGNOSIS — Z8669 Personal history of other diseases of the nervous system and sense organs: Secondary | ICD-10-CM | POA: Insufficient documentation

## 2013-05-23 DIAGNOSIS — R1110 Vomiting, unspecified: Secondary | ICD-10-CM | POA: Insufficient documentation

## 2013-05-23 DIAGNOSIS — Z79899 Other long term (current) drug therapy: Secondary | ICD-10-CM | POA: Insufficient documentation

## 2013-05-23 DIAGNOSIS — Z87442 Personal history of urinary calculi: Secondary | ICD-10-CM | POA: Insufficient documentation

## 2013-05-23 DIAGNOSIS — R5381 Other malaise: Secondary | ICD-10-CM | POA: Insufficient documentation

## 2013-05-23 DIAGNOSIS — R011 Cardiac murmur, unspecified: Secondary | ICD-10-CM | POA: Insufficient documentation

## 2013-05-23 DIAGNOSIS — R1011 Right upper quadrant pain: Secondary | ICD-10-CM | POA: Insufficient documentation

## 2013-05-23 DIAGNOSIS — K219 Gastro-esophageal reflux disease without esophagitis: Secondary | ICD-10-CM | POA: Insufficient documentation

## 2013-05-23 DIAGNOSIS — F17200 Nicotine dependence, unspecified, uncomplicated: Secondary | ICD-10-CM | POA: Insufficient documentation

## 2013-05-23 DIAGNOSIS — Z794 Long term (current) use of insulin: Secondary | ICD-10-CM | POA: Insufficient documentation

## 2013-05-23 DIAGNOSIS — R42 Dizziness and giddiness: Secondary | ICD-10-CM | POA: Insufficient documentation

## 2013-05-23 DIAGNOSIS — E119 Type 2 diabetes mellitus without complications: Secondary | ICD-10-CM | POA: Insufficient documentation

## 2013-05-23 DIAGNOSIS — R5383 Other fatigue: Secondary | ICD-10-CM

## 2013-05-23 DIAGNOSIS — R000 Tachycardia, unspecified: Secondary | ICD-10-CM | POA: Insufficient documentation

## 2013-05-23 LAB — URINALYSIS, ROUTINE W REFLEX MICROSCOPIC
Bilirubin Urine: NEGATIVE
Glucose, UA: >1000 mg/dL — AB
Hgb urine dipstick: NEGATIVE
Ketones, ur: NEGATIVE mg/dL
LEUKOCYTES UA: NEGATIVE
Nitrite: NEGATIVE
PROTEIN: NEGATIVE mg/dL
Specific Gravity, Urine: <1.005 — ABNORMAL LOW (ref 1.005–1.030)
UROBILINOGEN UA: 0.2 mg/dL (ref 0.0–1.0)
pH: 5.5 (ref 5.0–8.0)

## 2013-05-23 LAB — COMPREHENSIVE METABOLIC PANEL
ALK PHOS: 112 U/L (ref 39–117)
ALT: 22 U/L (ref 0–35)
AST: 23 U/L (ref 0–37)
Albumin: 3.5 g/dL (ref 3.5–5.2)
BUN: 7 mg/dL (ref 6–23)
CALCIUM: 9.4 mg/dL (ref 8.4–10.5)
CO2: 24 mEq/L (ref 19–32)
Chloride: 90 mEq/L — ABNORMAL LOW (ref 96–112)
Creatinine, Ser: 0.59 mg/dL (ref 0.50–1.10)
GLUCOSE: 599 mg/dL — AB (ref 70–99)
POTASSIUM: 3.6 meq/L — AB (ref 3.7–5.3)
SODIUM: 130 meq/L — AB (ref 137–147)
Total Bilirubin: 0.4 mg/dL (ref 0.3–1.2)
Total Protein: 6.7 g/dL (ref 6.0–8.3)

## 2013-05-23 LAB — CBC
HCT: 33.6 % — ABNORMAL LOW (ref 36.0–46.0)
Hemoglobin: 11.7 g/dL — ABNORMAL LOW (ref 12.0–15.0)
MCH: 31.4 pg (ref 26.0–34.0)
MCHC: 34.8 g/dL (ref 30.0–36.0)
MCV: 90.1 fL (ref 78.0–100.0)
Platelets: 205 10*3/uL (ref 150–400)
RBC: 3.73 MIL/uL — ABNORMAL LOW (ref 3.87–5.11)
RDW: 12.2 % (ref 11.5–15.5)
WBC: 5.3 10*3/uL (ref 4.0–10.5)

## 2013-05-23 LAB — URINE MICROSCOPIC-ADD ON

## 2013-05-23 LAB — CBG MONITORING, ED
GLUCOSE-CAPILLARY: 89 mg/dL (ref 70–99)
Glucose-Capillary: 221 mg/dL — ABNORMAL HIGH (ref 70–99)
Glucose-Capillary: 425 mg/dL — ABNORMAL HIGH (ref 70–99)
Glucose-Capillary: 90 mg/dL (ref 70–99)

## 2013-05-23 MED ORDER — INSULIN ASPART 100 UNIT/ML ~~LOC~~ SOLN
20.0000 [IU] | Freq: Once | SUBCUTANEOUS | Status: AC
  Administered 2013-05-23: 20 [IU] via SUBCUTANEOUS
  Filled 2013-05-23: qty 1

## 2013-05-23 MED ORDER — SODIUM CHLORIDE 0.9 % IV BOLUS (SEPSIS)
1000.0000 mL | Freq: Once | INTRAVENOUS | Status: AC
  Administered 2013-05-23: 1000 mL via INTRAVENOUS

## 2013-05-23 NOTE — Progress Notes (Signed)
Request for direct admission. Pt seen in the office and noted to have CBG > 600 with hyponatremia of 129. Asked [facility] to admit for management of hyperglycemia, ? HONK state.  Faye Ramsay, MD  [facility] Pager [phone] Cell [phone]  If 7PM-7AM, please contact night-coverage [email or web address] Password [email or web address]

## 2013-05-23 NOTE — Discharge Instructions (Signed)
Drink plenty of fluids and follow up with your md in 1-2 days. °

## 2013-05-23 NOTE — ED Notes (Signed)
CRITICAL VALUE ALERT  Critical value received:  Glucose 599  Date of notification:  05/23/2013  Time of notification:  2015   Critical value read back:yes  Nurse who received alert:  Matilde Haymaker, RN  MD notified (1st page):  Dr. Roderic Palau

## 2013-05-23 NOTE — ED Notes (Signed)
Pt was sent to ER today by PCP for "blood sugar over 600 and something else but I can't remember" per pt Sent by Dr. Melina Copa in [place], per pt.

## 2013-05-23 NOTE — ED Provider Notes (Signed)
CSN: [id number]     Arrival date & time 05/23/13  1543 History  This chart was scribed for Erica Diego, MD by Maree Erie, ED Scribe. The patient was seen in room APA19/APA19. Patient's care was started at 5:11 PM.     Chief Complaint  Patient presents with  . Hyperglycemia    Patient is a 58 y.o. female presenting with hyperglycemia. The history is provided by the patient. No language interpreter was used.  Hyperglycemia Blood sugar level PTA:  >600 Duration:  1 day Timing:  Constant Progression:  Unchanged Chronicity:  New Diabetes status:  Controlled with insulin Associated symptoms: abdominal pain, dizziness, vomiting and weakness   Associated symptoms: no chest pain and no fatigue     HPI Comments: MARGI [name] is a 58 y.o. female who presents to the Emergency Department complaining of symptomatic hyperglycemia. She states that she was feeling dizzy, weak and had abdominal pain with vomiting so she went to she her PCP, Dr. Melina Copa. She was referred to the ED due to a glucose reading of greater than 600 at the office. She states that she has been vomiting for three days. She injects insulin for her DM. She denies a surgical history of cholecystectomy.    Past Medical History  Diagnosis Date  . Chronic diarrhea   . History of kidney stones   . Heart murmur   . Neuropathy     Hx: of  . GERD (gastroesophageal reflux disease)   . Diabetes mellitus     fasting blood sugar 110-120s   Past Surgical History  Procedure Laterality Date  . Colonoscopy  02/24/2010  . Tubal ligation    . Multiple extractions with alveoloplasty N/A 06/13/2012    Procedure: MULTIPLE EXTRACION WITH ALVEOLOPLASTY EXTRACT: 18, 19, 20, 21, 22, 24, 25, 27, 28, 29, 30, 31;  Surgeon: Gae Bon, DDS;  Location: [facility];  Service: Oral Surgery;  Laterality: N/A;   Family History  Problem Relation Age of Onset  . Diabetes Sister    History  Substance Use Topics  . Smoking status: Current Every  Day Smoker    Types: Cigarettes  . Smokeless tobacco: Current User    Types: Snuff  . Alcohol Use: 0.0 oz/week     Comment: weekly   OB History   Grav Para Term Preterm Abortions TAB SAB Ect Mult Living                 Review of Systems  Constitutional: Negative for appetite change and fatigue.  HENT: Negative for congestion, ear discharge and sinus pressure.   Eyes: Negative for discharge.  Respiratory: Negative for cough.   Cardiovascular: Negative for chest pain.  Gastrointestinal: Positive for vomiting and abdominal pain. Negative for diarrhea.  Genitourinary: Negative for frequency and hematuria.  Musculoskeletal: Negative for back pain.  Skin: Negative for rash.  Neurological: Positive for dizziness. Negative for seizures and headaches.  Psychiatric/Behavioral: Negative for hallucinations.      Allergies  Aspirin  Home Medications   Current Outpatient Rx  Name  Route  Sig  Dispense  Refill  . albuterol (PROAIR HFA) 108 (90 BASE) MCG/ACT inhaler   Inhalation   Inhale 2 puffs into the lungs every 6 (six) hours as needed for wheezing or shortness of breath.         Marland Kitchen atorvastatin (LIPITOR) 40 MG tablet   Oral   Take 1 tablet (40 mg total) by mouth daily at 6 PM.   30  tablet   0   . ergocalciferol (VITAMIN D2) 50000 UNITS capsule   Oral   Take 50,000 Units by mouth daily.         Marland Kitchen esomeprazole (NEXIUM) 40 MG capsule   Oral   Take 40 mg by mouth daily.          . feeding supplement (GLUCERNA SHAKE) LIQD   Oral   Take 237 mLs by mouth 3 (three) times daily between meals.   30 Can   2   . folic acid (FOLVITE) 1 MG tablet   Oral   Take 1 mg by mouth daily.         Marland Kitchen glucose monitoring kit (FREESTYLE) monitoring kit   Does not apply   1 each by Does not apply route as needed for other.   1 each   0   . HYDROcodone-acetaminophen (NORCO) 5-325 MG per tablet   Oral   Take 1-2 tablets by mouth every 4 (four) hours as needed for pain.   20  tablet   0   . hyoscyamine (LEVSIN, ANASPAZ) 0.125 MG tablet   Oral   Take 0.125 mg by mouth 4 (four) times daily - after meals and at bedtime.         . insulin aspart (NOVOLOG FLEXPEN) 100 UNIT/ML SOPN FlexPen   Subcutaneous   Inject 5 Units into the skin 3 (three) times daily with meals.   10 mL   3   . insulin glargine (LANTUS) 100 UNIT/ML injection   Subcutaneous   Inject 0.24 mLs (24 Units total) into the skin at bedtime.   10 mL   12   . lisinopril (PRINIVIL,ZESTRIL) 10 MG tablet   Oral   Take 0.5 tablets (5 mg total) by mouth daily.         Marland Kitchen lubiprostone (AMITIZA) 8 MCG capsule   Oral   Take 8 mcg by mouth 2 (two) times daily with a meal.         . megestrol (MEGACE) 40 MG tablet   Oral   Take 40 mg by mouth 2 (two) times daily.         . metoprolol (LOPRESSOR) 25 MG tablet   Oral   Take 1 tablet (25 mg total) by mouth 2 (two) times daily.   60 tablet   2   . Multiple Vitamin (MULTIVITAMIN WITH MINERALS) TABS tablet   Oral   Take 1 tablet by mouth daily.   30 tablet   0   . ondansetron (ZOFRAN-ODT) 4 MG disintegrating tablet   Oral   Take 1 tablet (4 mg total) by mouth every 6 (six) hours as needed for nausea.   30 tablet   0   . potassium bicarbonate (K-LYTE) 25 MEQ disintegrating tablet   Oral   Take 25 mEq by mouth daily.          Marland Kitchen thiamine 100 MG tablet   Oral   Take 1 tablet (100 mg total) by mouth daily.   30 tablet   0   . traMADol (ULTRAM) 50 MG tablet   Oral   Take 2 tablets (100 mg total) by mouth every 6 (six) hours as needed.   60 tablet   0    Triage Vitals: BP 145/82  Pulse 104  Temp(Src) 98.4 F (36.9 C) (Oral)  Resp 18  Ht 5' (1.524 m)  Wt 91 lb (41.277 kg)  BMI 17.77 kg/m2  SpO2 100%  Physical Exam  Constitutional: She is oriented to person, place, and time. She appears well-developed.  HENT:  Head: Normocephalic.  Mouth/Throat: Mucous membranes are dry.  Dry mucous membranes.  Eyes: Conjunctivae  and EOM are normal. No scleral icterus.  Neck: Neck supple. No thyromegaly present.  Cardiovascular: Regular rhythm.  Tachycardia present.  Exam reveals no gallop and no friction rub.   No murmur heard. 2/6 holo systolic murmur  Pulmonary/Chest: No stridor. She has no wheezes. She has no rales. She exhibits no tenderness.  Abdominal: She exhibits no distension. There is tenderness. There is no rebound.  Moderate RUQ and RLQ tenderness.   Musculoskeletal: Normal range of motion. She exhibits no edema.  Lymphadenopathy:    She has no cervical adenopathy.  Neurological: She is oriented to person, place, and time. She exhibits normal muscle tone. Coordination normal.  Skin: No rash noted. No erythema.  Psychiatric: She has a normal mood and affect. Her behavior is normal.    ED Course  Procedures (including critical care time)  DIAGNOSTIC STUDIES: Oxygen Saturation is 100% on room air, normal by my interpretation.    COORDINATION OF CARE: 5:16 PM -Will order CMP, CBC, UA and CBG monitoring. Patient verbalizes understanding and agrees with treatment plan.    Labs Review Labs Reviewed  COMPREHENSIVE METABOLIC PANEL - Abnormal; Notable for the following:    Sodium 130 (*)    Potassium 3.6 (*)    Chloride 90 (*)    Glucose, Bld 599 (*)    All other components within normal limits  CBC - Abnormal; Notable for the following:    RBC 3.73 (*)    Hemoglobin 11.7 (*)    HCT 33.6 (*)    All other components within normal limits  URINALYSIS, ROUTINE W REFLEX MICROSCOPIC - Abnormal; Notable for the following:    Specific Gravity, Urine <1.005 (*)    Glucose, UA >1000 (*)    All other components within normal limits  CBG MONITORING, ED - Abnormal; Notable for the following:    Glucose-Capillary >600 (*)    All other components within normal limits  CBG MONITORING, ED - Abnormal; Notable for the following:    Glucose-Capillary 425 (*)    All other components within normal limits  CBG  MONITORING, ED - Abnormal; Notable for the following:    Glucose-Capillary 221 (*)    All other components within normal limits  URINE MICROSCOPIC-ADD ON   Imaging Review No results found.   EKG Interpretation None      MDM   Final diagnoses:  None   The pt improved with tx.  Will follow up with pcp   The chart was scribed for me under my direct supervision.  I personally performed the history, physical, and medical decision making and all procedures in the evaluation of this patient.Erica Diego, MD 05/23/13 [phone]

## 2013-05-23 NOTE — ED Notes (Signed)
Patient reporting pain to right back. Dr. Roderic Palau notified, stated for patient to take aleve as needed for pain. Patient verbalized understanding and stated no problems with taking aleve.

## 2013-05-24 NOTE — ED Notes (Signed)
Patient blood sugar 90, Dr. Roderic Palau notified patient finished entire meal tray. Patient verbalized understanding to check blood sugar prior to going to bed.

## 2013-05-24 NOTE — ED Notes (Signed)
Patient blood glucose 89 prior to discharge. Dr. Roderic Palau notified. Stated to give meal tray to patient and have patient eat, then recheck blood sugar and as long as blood sugar is above 80 then patient can be discharged home.

## 2013-06-01 ENCOUNTER — Other Ambulatory Visit (HOSPITAL_COMMUNITY): Payer: Self-pay | Admitting: Internal Medicine

## 2013-07-05 ENCOUNTER — Other Ambulatory Visit (HOSPITAL_COMMUNITY): Payer: Self-pay | Admitting: Internal Medicine

## 2013-07-14 NOTE — Progress Notes (Signed)
This encounter was created in error - please disregard.

## 2013-09-11 ENCOUNTER — Inpatient Hospital Stay (HOSPITAL_COMMUNITY)
Admission: EM | Admit: 2013-09-11 | Discharge: 2013-09-20 | DRG: 637 | Disposition: A | Discharge status: Home / Self Care | Payer: Medicaid Other | Attending: Family Medicine | Admitting: Family Medicine

## 2013-09-11 ENCOUNTER — Emergency Department (HOSPITAL_COMMUNITY): Payer: Medicaid Other

## 2013-09-11 ENCOUNTER — Encounter (HOSPITAL_COMMUNITY): Payer: Self-pay | Admitting: Emergency Medicine

## 2013-09-11 DIAGNOSIS — J9600 Acute respiratory failure, unspecified whether with hypoxia or hypercapnia: Secondary | ICD-10-CM | POA: Diagnosis present

## 2013-09-11 DIAGNOSIS — G8929 Other chronic pain: Secondary | ICD-10-CM | POA: Diagnosis present

## 2013-09-11 DIAGNOSIS — D696 Thrombocytopenia, unspecified: Secondary | ICD-10-CM | POA: Diagnosis present

## 2013-09-11 DIAGNOSIS — Z8719 Personal history of other diseases of the digestive system: Secondary | ICD-10-CM

## 2013-09-11 DIAGNOSIS — F1010 Alcohol abuse, uncomplicated: Secondary | ICD-10-CM

## 2013-09-11 DIAGNOSIS — E872 Acidosis, unspecified: Secondary | ICD-10-CM

## 2013-09-11 DIAGNOSIS — J9601 Acute respiratory failure with hypoxia: Secondary | ICD-10-CM | POA: Diagnosis present

## 2013-09-11 DIAGNOSIS — J690 Pneumonitis due to inhalation of food and vomit: Secondary | ICD-10-CM | POA: Diagnosis not present

## 2013-09-11 DIAGNOSIS — Z87442 Personal history of urinary calculi: Secondary | ICD-10-CM | POA: Diagnosis not present

## 2013-09-11 DIAGNOSIS — IMO0001 Reserved for inherently not codable concepts without codable children: Secondary | ICD-10-CM

## 2013-09-11 DIAGNOSIS — D649 Anemia, unspecified: Secondary | ICD-10-CM | POA: Diagnosis present

## 2013-09-11 DIAGNOSIS — G929 Unspecified toxic encephalopathy: Secondary | ICD-10-CM | POA: Diagnosis present

## 2013-09-11 DIAGNOSIS — G40909 Epilepsy, unspecified, not intractable, without status epilepticus: Secondary | ICD-10-CM | POA: Diagnosis present

## 2013-09-11 DIAGNOSIS — R339 Retention of urine, unspecified: Secondary | ICD-10-CM | POA: Diagnosis not present

## 2013-09-11 DIAGNOSIS — F329 Major depressive disorder, single episode, unspecified: Secondary | ICD-10-CM | POA: Diagnosis present

## 2013-09-11 DIAGNOSIS — E1165 Type 2 diabetes mellitus with hyperglycemia: Secondary | ICD-10-CM | POA: Diagnosis present

## 2013-09-11 DIAGNOSIS — Z833 Family history of diabetes mellitus: Secondary | ICD-10-CM | POA: Diagnosis not present

## 2013-09-11 DIAGNOSIS — F17200 Nicotine dependence, unspecified, uncomplicated: Secondary | ICD-10-CM | POA: Diagnosis present

## 2013-09-11 DIAGNOSIS — R509 Fever, unspecified: Secondary | ICD-10-CM

## 2013-09-11 DIAGNOSIS — Z681 Body mass index (BMI) 19 or less, adult: Secondary | ICD-10-CM

## 2013-09-11 DIAGNOSIS — IMO0002 Reserved for concepts with insufficient information to code with codable children: Principal | ICD-10-CM | POA: Diagnosis present

## 2013-09-11 DIAGNOSIS — E1169 Type 2 diabetes mellitus with other specified complication: Principal | ICD-10-CM

## 2013-09-11 DIAGNOSIS — E889 Metabolic disorder, unspecified: Secondary | ICD-10-CM | POA: Diagnosis present

## 2013-09-11 DIAGNOSIS — G92 Toxic encephalopathy: Secondary | ICD-10-CM | POA: Diagnosis present

## 2013-09-11 DIAGNOSIS — F10920 Alcohol use, unspecified with intoxication, uncomplicated: Secondary | ICD-10-CM

## 2013-09-11 DIAGNOSIS — H518 Other specified disorders of binocular movement: Secondary | ICD-10-CM | POA: Diagnosis present

## 2013-09-11 DIAGNOSIS — I252 Old myocardial infarction: Secondary | ICD-10-CM | POA: Diagnosis not present

## 2013-09-11 DIAGNOSIS — E43 Unspecified severe protein-calorie malnutrition: Secondary | ICD-10-CM | POA: Diagnosis present

## 2013-09-11 DIAGNOSIS — K219 Gastro-esophageal reflux disease without esophagitis: Secondary | ICD-10-CM | POA: Diagnosis present

## 2013-09-11 DIAGNOSIS — Z794 Long term (current) use of insulin: Secondary | ICD-10-CM | POA: Diagnosis not present

## 2013-09-11 DIAGNOSIS — F411 Generalized anxiety disorder: Secondary | ICD-10-CM | POA: Diagnosis present

## 2013-09-11 DIAGNOSIS — I10 Essential (primary) hypertension: Secondary | ICD-10-CM | POA: Diagnosis present

## 2013-09-11 DIAGNOSIS — F3289 Other specified depressive episodes: Secondary | ICD-10-CM | POA: Diagnosis present

## 2013-09-11 DIAGNOSIS — E162 Hypoglycemia, unspecified: Secondary | ICD-10-CM

## 2013-09-11 DIAGNOSIS — E1122 Type 2 diabetes mellitus with diabetic chronic kidney disease: Secondary | ICD-10-CM | POA: Diagnosis present

## 2013-09-11 DIAGNOSIS — B961 Klebsiella pneumoniae [K. pneumoniae] as the cause of diseases classified elsewhere: Secondary | ICD-10-CM | POA: Diagnosis present

## 2013-09-11 DIAGNOSIS — E440 Moderate protein-calorie malnutrition: Secondary | ICD-10-CM | POA: Diagnosis present

## 2013-09-11 DIAGNOSIS — G9341 Metabolic encephalopathy: Secondary | ICD-10-CM

## 2013-09-11 DIAGNOSIS — N390 Urinary tract infection, site not specified: Secondary | ICD-10-CM | POA: Diagnosis present

## 2013-09-11 DIAGNOSIS — R569 Unspecified convulsions: Secondary | ICD-10-CM

## 2013-09-11 LAB — CBG MONITORING, ED
GLUCOSE-CAPILLARY: 110 mg/dL — AB (ref 70–99)
GLUCOSE-CAPILLARY: 203 mg/dL — AB (ref 70–99)
GLUCOSE-CAPILLARY: 37 mg/dL — AB (ref 70–99)
GLUCOSE-CAPILLARY: 79 mg/dL (ref 70–99)
Glucose-Capillary: 59 mg/dL — ABNORMAL LOW (ref 70–99)
Glucose-Capillary: 79 mg/dL (ref 70–99)

## 2013-09-11 LAB — URINALYSIS, ROUTINE W REFLEX MICROSCOPIC
BILIRUBIN URINE: NEGATIVE
Glucose, UA: 500 mg/dL — AB
KETONES UR: NEGATIVE mg/dL
Leukocytes, UA: NEGATIVE
NITRITE: NEGATIVE
Protein, ur: NEGATIVE mg/dL
SPECIFIC GRAVITY, URINE: 1.015 (ref 1.005–1.030)
Urobilinogen, UA: 0.2 mg/dL (ref 0.0–1.0)
pH: 5.5 (ref 5.0–8.0)

## 2013-09-11 LAB — CBC WITH DIFFERENTIAL/PLATELET
BASOS ABS: 0 10*3/uL (ref 0.0–0.1)
Basophils Relative: 0 % (ref 0–1)
Eosinophils Absolute: 0.2 10*3/uL (ref 0.0–0.7)
Eosinophils Relative: 2 % (ref 0–5)
HCT: 31.5 % — ABNORMAL LOW (ref 36.0–46.0)
Hemoglobin: 10.8 g/dL — ABNORMAL LOW (ref 12.0–15.0)
Lymphocytes Relative: 27 % (ref 12–46)
Lymphs Abs: 2 10*3/uL (ref 0.7–4.0)
MCH: 30.6 pg (ref 26.0–34.0)
MCHC: 34.3 g/dL (ref 30.0–36.0)
MCV: 89.2 fL (ref 78.0–100.0)
MONO ABS: 0.4 10*3/uL (ref 0.1–1.0)
Monocytes Relative: 5 % (ref 3–12)
Neutro Abs: 4.9 10*3/uL (ref 1.7–7.7)
Neutrophils Relative %: 66 % (ref 43–77)
PLATELETS: 213 10*3/uL (ref 150–400)
RBC: 3.53 MIL/uL — ABNORMAL LOW (ref 3.87–5.11)
RDW: 12.4 % (ref 11.5–15.5)
WBC: 7.5 10*3/uL (ref 4.0–10.5)

## 2013-09-11 LAB — COMPREHENSIVE METABOLIC PANEL
ALBUMIN: 3.6 g/dL (ref 3.5–5.2)
ALT: 14 U/L (ref 0–35)
ANION GAP: 14 (ref 5–15)
AST: 26 U/L (ref 0–37)
Alkaline Phosphatase: 75 U/L (ref 39–117)
BILIRUBIN TOTAL: 0.3 mg/dL (ref 0.3–1.2)
BUN: 13 mg/dL (ref 6–23)
CALCIUM: 9.1 mg/dL (ref 8.4–10.5)
CHLORIDE: 104 meq/L (ref 96–112)
CO2: 20 mEq/L (ref 19–32)
CREATININE: 0.83 mg/dL (ref 0.50–1.10)
GFR calc Af Amer: 88 mL/min — ABNORMAL LOW (ref >90)
GFR calc non Af Amer: 76 mL/min — ABNORMAL LOW (ref >90)
Glucose, Bld: 51 mg/dL — ABNORMAL LOW (ref 70–99)
Potassium: 3.5 mEq/L — ABNORMAL LOW (ref 3.7–5.3)
Sodium: 138 mEq/L (ref 137–147)
Total Protein: 6.8 g/dL (ref 6.0–8.3)

## 2013-09-11 LAB — RAPID URINE DRUG SCREEN, HOSP PERFORMED
AMPHETAMINES: NOT DETECTED
BENZODIAZEPINES: POSITIVE — AB
Barbiturates: NOT DETECTED
Cocaine: NOT DETECTED
Opiates: NOT DETECTED
Tetrahydrocannabinol: NOT DETECTED

## 2013-09-11 LAB — BLOOD GAS, ARTERIAL
ACID-BASE DEFICIT: 5.6 mmol/L — AB (ref 0.0–2.0)
Bicarbonate: 18.7 mEq/L — ABNORMAL LOW (ref 20.0–24.0)
Drawn by: 38235
FIO2: 0.45 %
LHR: 16 {breaths}/min
MECHVT: 500 mL
O2 SAT: 99.2 %
PCO2 ART: 32.6 mmHg — AB (ref 35.0–45.0)
PEEP: 5 cmH2O
PO2 ART: 206 mmHg — AB (ref 80.0–100.0)
Patient temperature: 37
TCO2: 17.3 mmol/L (ref 0–100)
pH, Arterial: 7.376 (ref 7.350–7.450)

## 2013-09-11 LAB — URINE MICROSCOPIC-ADD ON

## 2013-09-11 LAB — PROTIME-INR
INR: 1.08 (ref 0.00–1.49)
Prothrombin Time: 14 seconds (ref 11.6–15.2)

## 2013-09-11 LAB — TROPONIN I

## 2013-09-11 LAB — ETHANOL: Alcohol, Ethyl (B): <11 mg/dL (ref 0–11)

## 2013-09-11 LAB — ACETAMINOPHEN LEVEL: Acetaminophen (Tylenol), Serum: <15 ug/mL (ref 10–30)

## 2013-09-11 LAB — AMMONIA: Ammonia: 49 umol/L (ref 11–60)

## 2013-09-11 LAB — SALICYLATE LEVEL

## 2013-09-11 LAB — LACTIC ACID, PLASMA: Lactic Acid, Venous: 2.3 mmol/L — ABNORMAL HIGH (ref 0.5–2.2)

## 2013-09-11 MED ORDER — DEXTROSE-NACL 5-0.45 % IV SOLN
INTRAVENOUS | Status: DC
  Administered 2013-09-11 – 2013-09-12 (×2): via INTRAVENOUS

## 2013-09-11 MED ORDER — DEXTROSE 50 % IV SOLN
50.0000 mL | Freq: Once | INTRAVENOUS | Status: AC
  Administered 2013-09-11: 50 mL via INTRAVENOUS

## 2013-09-11 MED ORDER — NALOXONE HCL 0.4 MG/ML IJ SOLN: 0.4000 mg | INTRAMUSCULAR | Status: DC | PRN

## 2013-09-11 MED ORDER — SODIUM CHLORIDE 0.9 % IV BOLUS (SEPSIS)
1000.0000 mL | Freq: Once | INTRAVENOUS | Status: AC
  Administered 2013-09-11: 1000 mL via INTRAVENOUS

## 2013-09-11 MED ORDER — LORAZEPAM 2 MG/ML IJ SOLN
1.0000 mg | Freq: Once | INTRAMUSCULAR | Status: AC
Start: 2013-09-11 — End: 2013-09-11
  Administered 2013-09-11: 1 mg via INTRAVENOUS
  Filled 2013-09-11: qty 1

## 2013-09-11 MED ORDER — ROCURONIUM BROMIDE 50 MG/5ML IV SOLN
INTRAVENOUS | Status: AC
Start: 2013-09-11 — End: 2013-09-12
  Filled 2013-09-11: qty 2

## 2013-09-11 MED ORDER — PROPOFOL 10 MG/ML IV EMUL
5.0000 ug/kg/min | INTRAVENOUS | Status: DC
  Administered 2013-09-11: 5 ug/kg/min via INTRAVENOUS

## 2013-09-11 MED ORDER — LEVETIRACETAM 500 MG/5ML IV SOLN
INTRAVENOUS | Status: AC
  Filled 2013-09-11: qty 10

## 2013-09-11 MED ORDER — SUCCINYLCHOLINE CHLORIDE 20 MG/ML IJ SOLN
INTRAMUSCULAR | Status: AC
  Administered 2013-09-11: 100 mg via INTRAVENOUS
  Filled 2013-09-11: qty 1

## 2013-09-11 MED ORDER — HYDROGEN PEROXIDE 3 % EX SOLN
CUTANEOUS | Status: AC
  Filled 2013-09-11: qty 473

## 2013-09-11 MED ORDER — LEVETIRACETAM IN NACL 1000 MG/100ML IV SOLN
1000.0000 mg | Freq: Once | INTRAVENOUS | Status: AC
  Administered 2013-09-11: 1000 mg via INTRAVENOUS
  Filled 2013-09-11: qty 100

## 2013-09-11 MED ORDER — DEXTROSE 50 % IV SOLN
INTRAVENOUS | Status: AC
  Administered 2013-09-11: 50 mL
  Filled 2013-09-11: qty 50

## 2013-09-11 MED ORDER — ETOMIDATE 2 MG/ML IV SOLN
INTRAVENOUS | Status: AC
  Administered 2013-09-11: 20 mg via INTRAVENOUS
  Filled 2013-09-11: qty 20

## 2013-09-11 MED ORDER — LIDOCAINE HCL (CARDIAC) 20 MG/ML IV SOLN
INTRAVENOUS | Status: AC
  Filled 2013-09-11: qty 5

## 2013-09-11 MED ORDER — PROPOFOL 10 MG/ML IV EMUL
INTRAVENOUS | Status: AC
  Filled 2013-09-11: qty 100

## 2013-09-11 MED ORDER — DEXTROSE-NACL 5-0.45 % IV SOLN
INTRAVENOUS | Status: DC
Start: 2013-09-11 — End: 2013-09-12

## 2013-09-11 MED ORDER — DEXTROSE 50 % IV SOLN
INTRAVENOUS | Status: AC
  Administered 2013-09-11: 50 mL via INTRAVENOUS
  Filled 2013-09-11: qty 50

## 2013-09-11 NOTE — H&P (Signed)
PCP:   Octavio Graves, DO   Chief Complaint:  Found down  HPI: 58 yo female iddm found down by her family tonight who called ems.  ems found pt her glucose was 10.  On arrival to ED after given an amp of D50, glucose was up to 300 (per ed records).  Pt was posturing on arrival with fixed rightward gaze, but no overt seizure activity.  Pt was given narcan with no response.  She was intubated to protect her airway, she had good vital signs.  Soon after intubation, she started becoming more responsive, moving all of her extemities and fighting the vent at which point propofol gtt was initiated.  No family in ED, or icu.  Pt ED w/u reveals a normal ct head with no evidence of acute abnormality, normal electolytes and renal function.  Neg trop, neg cxr and ua.  uds pos for benzos.  etoh level neg.  hgb 10.8.  Wbc normal.  Glucose repeatedly rises some then dropping, has received 3 amps d50 and now on d51/2ns gtt.  Pt is moderately sedated on ventilator.  Review of Systems:  Unobtainable from patient due to vent status  Past Medical History: Past Medical History  Diagnosis Date  . Chronic diarrhea   . History of kidney stones   . Heart murmur   . Neuropathy     Hx: of  . GERD (gastroesophageal reflux disease)   . Diabetes mellitus     fasting blood sugar 110-120s   Past Surgical History  Procedure Laterality Date  . Colonoscopy  02/24/2010  . Tubal ligation    . Multiple extractions with alveoloplasty N/A 06/13/2012    Procedure: MULTIPLE EXTRACION WITH ALVEOLOPLASTY EXTRACT: 18, 19, 20, 21, 22, 24, 25, 27, 28, 29, 30, 31;  Surgeon: Gae Bon, DDS;  Location: [facility];  Service: Oral Surgery;  Laterality: N/A;    Medications: Prior to Admission medications   Medication Sig Start Date End Date Taking? Authorizing Provider  albuterol (PROAIR HFA) 108 (90 BASE) MCG/ACT inhaler Inhale 2 puffs into the lungs every 6 (six) hours as needed for wheezing or shortness of breath.    Historical  Provider, MD  atorvastatin (LIPITOR) 40 MG tablet Take 1 tablet (40 mg total) by mouth daily at 6 PM. 10/29/12   Nishant Dhungel, MD  doxepin (SINEQUAN) 50 MG capsule Take 50 mg by mouth at bedtime.    Historical Provider, MD  ergocalciferol (VITAMIN D2) 50000 UNITS capsule Take 50,000 Units by mouth daily.    Historical Provider, MD  esomeprazole (NEXIUM) 40 MG capsule Take 40 mg by mouth daily.     Historical Provider, MD  feeding supplement (GLUCERNA SHAKE) LIQD Take 237 mLs by mouth 3 (three) times daily between meals. 10/29/12   Nishant Dhungel, MD  folic acid (FOLVITE) 1 MG tablet Take 1 mg by mouth daily.    Historical Provider, MD  hyoscyamine (LEVSIN, ANASPAZ) 0.125 MG tablet Take 0.125 mg by mouth 4 (four) times daily - after meals and at bedtime.    Historical Provider, MD  insulin aspart (NOVOLOG FLEXPEN) 100 UNIT/ML SOPN FlexPen Inject 5 Units into the skin 3 (three) times daily with meals. 10/29/12   Nishant Dhungel, MD  insulin glargine (LANTUS) 100 UNIT/ML injection Inject 0.24 mLs (24 Units total) into the skin at bedtime. 10/29/12   Nishant Dhungel, MD  lisinopril (PRINIVIL,ZESTRIL) 10 MG tablet Take 0.5 tablets (5 mg total) by mouth daily. 03/20/13   Ripudeep Krystal Eaton, MD  lubiprostone (AMITIZA) 8 MCG capsule Take 8 mcg by mouth 2 (two) times daily with a meal.    Historical Provider, MD  megestrol (MEGACE) 40 MG tablet Take 40 mg by mouth 2 (two) times daily.    Historical Provider, MD  metoprolol (LOPRESSOR) 50 MG tablet Take 50 mg by mouth 2 (two) times daily.    Historical Provider, MD  Multiple Vitamin (MULTIVITAMIN WITH MINERALS) TABS tablet Take 1 tablet by mouth daily.    Historical Provider, MD  oxycodone (ROXICODONE) 30 MG immediate release tablet Take 30 mg by mouth every 4 (four) hours as needed for pain.    Historical Provider, MD  potassium bicarbonate (K-LYTE) 25 MEQ disintegrating tablet Take 25 mEq by mouth daily.     Historical Provider, MD  thiamine 100 MG tablet Take 1  tablet (100 mg total) by mouth daily. 10/29/12   Louellen Molder, MD    Allergies:   Allergies  Allergen Reactions  . Aspirin Palpitations    Social History:  reports that she has been smoking Cigarettes.  She has been smoking about 0.00 packs per day. Her smokeless tobacco use includes Snuff. She reports that she drinks alcohol. She reports that she does not use illicit drugs.  Family History: Family History  Problem Relation Age of Onset  . Diabetes Sister     Physical Exam: Filed Vitals:   09/11/13 2216 09/11/13 2230 09/11/13 2300 09/11/13 2303  BP: 146/84 130/90 127/79   Pulse: 81  80   Temp: 97.2 F (36.2 C)   97.5 F (36.4 C)  TempSrc: Core (Comment)   Core (Comment)  Resp: 18 26 19    SpO2: 100%  100%    General appearance: no distress resting well on ventilator Head: Normocephalic, without obvious abnormality, atraumatic Eyes: negative Nose: Nares normal. Septum midline. Mucosa normal. No drainage or sinus tenderness. Neck: no JVD and supple, symmetrical, trachea midline Lungs: clear to auscultation bilaterally Breasts: Inspection negative Heart: regular rate and rhythm, S1, S2 normal, no murmur, click, rub or gallop Abdomen: soft, non-tender; bowel sounds normal; no masses,  no organomegaly Extremities: extremities normal, atraumatic, no cyanosis or edema Pulses: 2+ and symmetric Skin: Skin color, texture, turgor normal. No rashes or lesions Neurologic: sedated on propofol.  Mae.  Responds to painful stimuli.  Pupils equal and reactive equally.   Labs on Admission:   Recent Labs  09/11/13 2122  NA 138  K 3.5*  CL 104  CO2 20  GLUCOSE 51*  BUN 13  CREATININE 0.83  CALCIUM 9.1    Recent Labs  09/11/13 2122  AST 26  ALT 14  ALKPHOS 75  BILITOT 0.3  PROT 6.8  ALBUMIN 3.6    Recent Labs  09/11/13 2122  WBC 7.5  NEUTROABS 4.9  HGB 10.8*  HCT 31.5*  MCV 89.2  PLT 213    Recent Labs  09/11/13 2122  TROPONINI <0.30   Radiological  Exams on Admission: Ct Head Wo Contrast  09/11/2013   CLINICAL DATA:  Altered mental status.  The patient is unresponsive.  EXAM: CT HEAD WITHOUT CONTRAST  TECHNIQUE: Contiguous axial images were obtained from the base of the skull through the vertex without intravenous contrast.  COMPARISON:  07/08/2011  FINDINGS: No mass lesion. No midline shift. No acute hemorrhage or hematoma. No extra-axial fluid collections. No evidence of acute infarction. There is mild diffuse atrophy with no ventricular dilatation. Brain parenchyma is otherwise normal. Osseous structures are normal.  IMPRESSION: No significant abnormality.  Slight  diffuse atrophy, unchanged.   Electronically Signed   By: Rozetta Nunnery M.D.   On: 09/11/2013 22:23   Dg Chest Portable 1 View  09/11/2013   CLINICAL DATA:  Post intubation.  EXAM: PORTABLE CHEST - 1 VIEW  COMPARISON:  04/28/2013  FINDINGS: Endotracheal tube is been placed with tip measuring 3.1 cm above the carina. Enteric tube was placed with tip over the EG junction, likely in the distal esophagus. Proximal side hole is in the mid esophageal region. Normal heart size and pulmonary vascularity. No focal airspace disease or consolidation in the lungs. No pneumothorax. No blunting of costophrenic angles.  IMPRESSION: Endotracheal tube tip measures 3.1 cm above the carinal. Enteric tube tip appears to be in the distal esophagus.   Electronically Signed   By: Lucienne Capers M.D.   On: 09/11/2013 22:11    Assessment/Plan  58 yo female found unresponsive at home suspect from severe hypoglycemia and probable status epilepticus   Principal Problem:   Acute respiratory failure requiring reintubation-  Will need to obtain more info from family when available.  This likely due to hypoglycemia and started having seizures.  Pt however also has history in her chart for significant etoh abuse and withdrawal, etoh level was less than ten, but this could also be related to withdrawal, do not know  if she has been actively drinking etoh.  Cont ventilatory support overnight and wean if tolerates.  cxr and ua show no signs of infection.  Her skin also is clear of rashes/lesions.  Ct head also looks reassuring.  Cont aggressive glucose checks q 30 minutes for the next couple of hours on d5 gtt (d10 not available).  Ordered eeg in am, and neurology consult requested.  Pt has been loaded with keppra, i will continue this at 1000mg  q12 for now.  Will also provide ativan prn, and a liter of bananna bag.  Will also serial trop/ekg is nonacute.  Obviously hold all insulin products.  Also consider intentional insulin overdose, pt will have to be closely questioned once weaned extubated.   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  Full code.  Cc time 1.5 hours.   CRITICAL CARE Performed by: Derrill Kay A   Total critical care time: 90 minutes  Critical care time was exclusive of separately billable procedures and treating other patients.  Critical care was necessary to treat or prevent imminent or life-threatening deterioration.  Critical care was time spent personally by me on the following activities: development of treatment plan with patient and/or surrogate as well as nursing, discussions with consultants, evaluation of patient's response to treatment, examination of patient, obtaining history from patient or surrogate, ordering and performing treatments and interventions, ordering and review of laboratory studies, ordering and review of radiographic studies, pulse oximetry and re-evaluation of patient's condition.   Elfreda Blanchet A 09/11/2013, 11:16 PM

## 2013-09-11 NOTE — ED Provider Notes (Signed)
CSN: [id number]     Arrival date & time 09/11/13  2101 History   First MD Initiated Contact with Patient 09/11/13 2110     Chief Complaint  Patient presents with  . Loss of Consciousness    unresponsive     (Consider location/radiation/quality/duration/timing/severity/associated sxs/prior Treatment) HPI Comments: Patient from home with unresponsiveness. Family members called EMS for unresponsive patient. Found to have blood sugar of 10. Given D50 with increase in sugar 300 but no change in mental status. On arrival patient is nonresponsive, has sonorous respirations his teeth clenched, eyes deviated to the right. No seizure activity. No incontinence or tongue biting. Narcan given without response.   The history is provided by the EMS personnel and the patient. The history is limited by the condition of the patient.    Past Medical History  Diagnosis Date  . Chronic diarrhea   . History of kidney stones   . Heart murmur   . Neuropathy     Hx: of  . GERD (gastroesophageal reflux disease)   . Diabetes mellitus     fasting blood sugar 110-120s   Past Surgical History  Procedure Laterality Date  . Colonoscopy  02/24/2010  . Tubal ligation    . Multiple extractions with alveoloplasty N/A 06/13/2012    Procedure: MULTIPLE EXTRACION WITH ALVEOLOPLASTY EXTRACT: 18, 19, 20, 21, 22, 24, 25, 27, 28, 29, 30, 31;  Surgeon: Gae Bon, DDS;  Location: [facility];  Service: Oral Surgery;  Laterality: N/A;   Family History  Problem Relation Age of Onset  . Diabetes Sister    History  Substance Use Topics  . Smoking status: Current Every Day Smoker    Types: Cigarettes  . Smokeless tobacco: Current User    Types: Snuff  . Alcohol Use: 0.0 oz/week     Comment: weekly   OB History   Grav Para Term Preterm Abortions TAB SAB Ect Mult Living                 Review of Systems  Unable to perform ROS: Patient unresponsive  Cardiovascular: Positive for syncope.      Allergies   Aspirin  Home Medications   Prior to Admission medications   Medication Sig Start Date End Date Taking? Authorizing Provider  albuterol (PROAIR HFA) 108 (90 BASE) MCG/ACT inhaler Inhale 2 puffs into the lungs every 6 (six) hours as needed for wheezing or shortness of breath.    Historical Provider, MD  atorvastatin (LIPITOR) 40 MG tablet Take 1 tablet (40 mg total) by mouth daily at 6 PM. 10/29/12   Nishant Dhungel, MD  doxepin (SINEQUAN) 50 MG capsule Take 50 mg by mouth at bedtime.    Historical Provider, MD  ergocalciferol (VITAMIN D2) 50000 UNITS capsule Take 50,000 Units by mouth daily.    Historical Provider, MD  esomeprazole (NEXIUM) 40 MG capsule Take 40 mg by mouth daily.     Historical Provider, MD  feeding supplement (GLUCERNA SHAKE) LIQD Take 237 mLs by mouth 3 (three) times daily between meals. 10/29/12   Nishant Dhungel, MD  folic acid (FOLVITE) 1 MG tablet Take 1 mg by mouth daily.    Historical Provider, MD  hyoscyamine (LEVSIN, ANASPAZ) 0.125 MG tablet Take 0.125 mg by mouth 4 (four) times daily - after meals and at bedtime.    Historical Provider, MD  insulin aspart (NOVOLOG FLEXPEN) 100 UNIT/ML SOPN FlexPen Inject 5 Units into the skin 3 (three) times daily with meals. 10/29/12   Nishant Dhungel,  MD  insulin glargine (LANTUS) 100 UNIT/ML injection Inject 0.24 mLs (24 Units total) into the skin at bedtime. 10/29/12   Nishant Dhungel, MD  lisinopril (PRINIVIL,ZESTRIL) 10 MG tablet Take 0.5 tablets (5 mg total) by mouth daily. 03/20/13   Ripudeep Krystal Eaton, MD  lubiprostone (AMITIZA) 8 MCG capsule Take 8 mcg by mouth 2 (two) times daily with a meal.    Historical Provider, MD  megestrol (MEGACE) 40 MG tablet Take 40 mg by mouth 2 (two) times daily.    Historical Provider, MD  metoprolol (LOPRESSOR) 50 MG tablet Take 50 mg by mouth 2 (two) times daily.    Historical Provider, MD  Multiple Vitamin (MULTIVITAMIN WITH MINERALS) TABS tablet Take 1 tablet by mouth daily.    Historical Provider,  MD  oxycodone (ROXICODONE) 30 MG immediate release tablet Take 30 mg by mouth every 4 (four) hours as needed for pain.    Historical Provider, MD  potassium bicarbonate (K-LYTE) 25 MEQ disintegrating tablet Take 25 mEq by mouth daily.     Historical Provider, MD  thiamine 100 MG tablet Take 1 tablet (100 mg total) by mouth daily. 10/29/12   Nishant Dhungel, MD   BP 134/72  Pulse 85  Temp(Src) 98.3 F (36.8 C) (Axillary)  Resp 18  Ht 5' (1.524 m)  Wt 89 lb 15.9 oz (40.82 kg)  BMI 17.58 kg/m2  SpO2 100% Physical Exam  Constitutional: She appears well-developed and well-nourished. No distress.  HENT:  Head: Normocephalic and atraumatic.  Mouth/Throat: Oropharynx is clear and moist. No oropharyngeal exudate.  Eyes:  Eyes deviated to the right. Pupils 5 mm and sluggish bilaterally  Neck: Normal range of motion. Neck supple.  Cardiovascular: Normal rate, regular rhythm and normal heart sounds.   No murmur heard. Pulmonary/Chest: Effort normal and breath sounds normal.  Abdominal: Soft. There is no tenderness. There is no rebound and no guarding.  Musculoskeletal: Normal range of motion. She exhibits no edema and no tenderness.  Neurological:  Unresponsive, GCS 3 R sided gaze, Posturing and stiff extremities  Skin: Skin is warm.    ED Course  INTUBATION Date/Time: 09/11/2013 10:02 PM Performed by: Ezequiel Essex Authorized by: Ezequiel Essex Consent: The procedure was performed in an emergent situation. Risks and benefits: risks, benefits and alternatives were discussed Consent given by: patient Patient understanding: patient states understanding of the procedure being performed Patient consent: the patient's understanding of the procedure matches consent given Patient identity confirmed: verbally with patient and provided demographic data Time out: Immediately prior to procedure a "time out" was called to verify the correct patient, procedure, equipment, support staff and  site/side marked as required. Indications: respiratory failure and  airway protection Intubation method: video-assisted Patient status: paralyzed (RSI) Preoxygenation: nonrebreather mask and BVM Sedatives: etomidate Paralytic: succinylcholine Laryngoscope size: Mac 4 Tube size: 7.5 mm Tube type: cuffed Number of attempts: 1 Ventilation between attempts: BVM Cricoid pressure: yes Cords visualized: yes Post-procedure assessment: chest rise and ETCO2 monitor Breath sounds: equal Cuff inflated: yes ETT to lip: 23 cm Tube secured with: ETT holder Chest x-ray interpreted by radiologist. Chest x-ray findings: endotracheal tube in appropriate position Patient tolerance: Patient tolerated the procedure well with no immediate complications.   (including critical care time) Labs Review Labs Reviewed  CBC WITH DIFFERENTIAL - Abnormal; Notable for the following:    RBC 3.53 (*)    Hemoglobin 10.8 (*)    HCT 31.5 (*)    All other components within normal limits  COMPREHENSIVE METABOLIC PANEL -  Abnormal; Notable for the following:    Potassium 3.5 (*)    Glucose, Bld 51 (*)    GFR calc non Af Amer 76 (*)    GFR calc Af Amer 88 (*)    All other components within normal limits  URINALYSIS, ROUTINE W REFLEX MICROSCOPIC - Abnormal; Notable for the following:    Glucose, UA 500 (*)    Hgb urine dipstick TRACE (*)    All other components within normal limits  URINE RAPID DRUG SCREEN (HOSP PERFORMED) - Abnormal; Notable for the following:    Benzodiazepines POSITIVE (*)    All other components within normal limits  SALICYLATE LEVEL - Abnormal; Notable for the following:    Salicylate Lvl 123456 (*)    All other components within normal limits  LACTIC ACID, PLASMA - Abnormal; Notable for the following:    Lactic Acid, Venous 2.3 (*)    All other components within normal limits  BLOOD GAS, ARTERIAL - Abnormal; Notable for the following:    pCO2 arterial 32.6 (*)    pO2, Arterial 206.0 (*)     Bicarbonate 18.7 (*)    Acid-base deficit 5.6 (*)    All other components within normal limits  URINE MICROSCOPIC-ADD ON - Abnormal; Notable for the following:    Bacteria, UA MANY (*)    All other components within normal limits  GLUCOSE, CAPILLARY - Abnormal; Notable for the following:    Glucose-Capillary 125 (*)    All other components within normal limits  GLUCOSE, CAPILLARY - Abnormal; Notable for the following:    Glucose-Capillary 146 (*)    All other components within normal limits  GLUCOSE, CAPILLARY - Abnormal; Notable for the following:    Glucose-Capillary 156 (*)    All other components within normal limits  GLUCOSE, CAPILLARY - Abnormal; Notable for the following:    Glucose-Capillary 175 (*)    All other components within normal limits  CBG MONITORING, ED - Abnormal; Notable for the following:    Glucose-Capillary 37 (*)    All other components within normal limits  CBG MONITORING, ED - Abnormal; Notable for the following:    Glucose-Capillary 110 (*)    All other components within normal limits  CBG MONITORING, ED - Abnormal; Notable for the following:    Glucose-Capillary 59 (*)    All other components within normal limits  CBG MONITORING, ED - Abnormal; Notable for the following:    Glucose-Capillary 203 (*)    All other components within normal limits  MRSA PCR SCREENING  TROPONIN I  PROTIME-INR  ETHANOL  ACETAMINOPHEN LEVEL  AMMONIA  TROPONIN I  TROPONIN I  TROPONIN I  COMPREHENSIVE METABOLIC PANEL  CBC  PROTIME-INR  BLOOD GAS, ARTERIAL  CBG MONITORING, ED  CBG MONITORING, ED    Imaging Review Abd 1 View (kub)  09/12/2013   CLINICAL DATA:  NG tube placement.  Patient found unresponsive.  EXAM: ABDOMEN - 1 VIEW  COMPARISON:  07/20/2013  FINDINGS: Marked gaseous distention of the stomach. Enteric tube tip is in the upper mid abdomen, likely in the upper stomach but the proximal side hole is at the EG junction, likely in the distal esophagus.  Advancement is suggested. Calcifications in the upper abdomen appear to represent pancreatic calcification and probably suggests chronic pancreatitis.  IMPRESSION: Enteric tube tip appears to be in the upper stomach with proximal side hole likely in the distal esophagus. Advancement is suggested.   Electronically Signed   By: Oren Beckmann.D.  On: 09/12/2013 01:23   Ct Head Wo Contrast  09/11/2013   CLINICAL DATA:  Altered mental status.  The patient is unresponsive.  EXAM: CT HEAD WITHOUT CONTRAST  TECHNIQUE: Contiguous axial images were obtained from the base of the skull through the vertex without intravenous contrast.  COMPARISON:  07/08/2011  FINDINGS: No mass lesion. No midline shift. No acute hemorrhage or hematoma. No extra-axial fluid collections. No evidence of acute infarction. There is mild diffuse atrophy with no ventricular dilatation. Brain parenchyma is otherwise normal. Osseous structures are normal.  IMPRESSION: No significant abnormality.  Slight diffuse atrophy, unchanged.   Electronically Signed   By: Rozetta Nunnery M.D.   On: 09/11/2013 22:23   Dg Chest Portable 1 View  09/11/2013   CLINICAL DATA:  Post intubation.  EXAM: PORTABLE CHEST - 1 VIEW  COMPARISON:  04/28/2013  FINDINGS: Endotracheal tube is been placed with tip measuring 3.1 cm above the carina. Enteric tube was placed with tip over the EG junction, likely in the distal esophagus. Proximal side hole is in the mid esophageal region. Normal heart size and pulmonary vascularity. No focal airspace disease or consolidation in the lungs. No pneumothorax. No blunting of costophrenic angles.  IMPRESSION: Endotracheal tube tip measures 3.1 cm above the carinal. Enteric tube tip appears to be in the distal esophagus.   Electronically Signed   By: Lucienne Capers M.D.   On: 09/11/2013 22:11     EKG Interpretation   Date/Time:  Monday September 11 2013 21:16:06 EDT Ventricular Rate:  74 PR Interval:  173 QRS Duration: 78 QT  Interval:  391 QTC Calculation: 434 R Axis:   16 Text Interpretation:  Sinus rhythm No significant change was found  Confirmed by Wyvonnia Dusky  MD, Hadleigh Felber [phone]) on 09/11/2013 9:28:33 PM      MDM   Final diagnoses:  Acute respiratory failure, unspecified whether with hypoxia or hypercapnia  Seizure  Hypoglycemia   Unresponsive, low blood sugar, deviated gaze with posturing. No response to Narcan. Blood sugar 300 on arrival. Patient intubated for airway protection on arrival. ?seizure. R/o ICH.  CBG downtrended to 37. D50 given. CBG 110.  CT head negative. Chest x-ray without infiltrate.  Gaze has returned to midline after Ativan and Keppra. No evidence seizure activity. Patient is fighting the ventilator and moving her extremities. Propofol started.  Suspect status epilepticus from hypoglycemia. Will start dextrose infusion. Dr. Shanon Brow is comfortable with patient standing at [facility] as long as Dr. Merlene Laughter agrees. Dr. Merlene Laughter agrees with keppra, correction of glucose and EEG.  Apiration of blood/fluid Performed by: Ezequiel Essex Consent obtained. Required items: required blood products, implants, devices, and special equipment available Patient identity confirmed: verbally with patient Time out: Immediately prior to procedure a "time out" was called to verify the correct patient, procedure, equipment, support staff and site/side marked as required. Preparation: Patient was prepped and draped in the usual sterile fashion. Patient tolerance: Patient tolerated the procedure well with no immediate complications.  Location of aspiration: R femoral vein  Angiocath insertion Performed by: Ezequiel Essex  Consent: Verbal consent obtained. Risks and benefits: risks, benefits and alternatives were discussed Time out: Immediately prior to procedure a "time out" was called to verify the correct patient, procedure, equipment, support staff and site/side marked as  required.  Preparation: Patient was prepped and draped in the usual sterile fashion.  Vein Location: L EJ  Not Ultrasound Guided  Gauge: 20  Normal blood return and flush without difficulty Patient tolerance: Patient  tolerated the procedure well with no immediate complications.    CRITICAL CARE Performed by: Ezequiel Essex Total critical care time: 45 Critical care time was exclusive of separately billable procedures and treating other patients. Critical care was necessary to treat or prevent imminent or life-threatening deterioration. Critical care was time spent personally by me on the following activities: development of treatment plan with patient and/or surrogate as well as nursing, discussions with consultants, evaluation of patient's response to treatment, examination of patient, obtaining history from patient or surrogate, ordering and performing treatments and interventions, ordering and review of laboratory studies, ordering and review of radiographic studies, pulse oximetry and re-evaluation of patient's condition.     Ezequiel Essex, MD 09/12/13 [phone]

## 2013-09-11 NOTE — ED Notes (Signed)
Patient via [facility]. Patient unresponsive upon arrival to ED, patient has snoring respirations, 100% on 15L NRB. Patients teeth are clamped, eyes deviated to right, head turned to left. Pupils are 4 sluggish.

## 2013-09-11 NOTE — ED Notes (Signed)
IO in place via [facility] right lower leg. Infusing, no swelling noted at this time

## 2013-09-11 NOTE — ED Notes (Signed)
#  18g EJ placed py Dr. Melanee Left to left neck

## 2013-09-11 NOTE — ED Notes (Signed)
20 mg etomidate given at this time.

## 2013-09-12 ENCOUNTER — Inpatient Hospital Stay (HOSPITAL_COMMUNITY): Payer: Medicaid Other

## 2013-09-12 ENCOUNTER — Other Ambulatory Visit (HOSPITAL_COMMUNITY): Payer: Medicaid Other

## 2013-09-12 ENCOUNTER — Encounter (HOSPITAL_COMMUNITY): Payer: Medicaid Other

## 2013-09-12 DIAGNOSIS — J9600 Acute respiratory failure, unspecified whether with hypoxia or hypercapnia: Secondary | ICD-10-CM

## 2013-09-12 LAB — GLUCOSE, CAPILLARY
GLUCOSE-CAPILLARY: 125 mg/dL — AB (ref 70–99)
GLUCOSE-CAPILLARY: 175 mg/dL — AB (ref 70–99)
GLUCOSE-CAPILLARY: 196 mg/dL — AB (ref 70–99)
Glucose-Capillary: 146 mg/dL — ABNORMAL HIGH (ref 70–99)
Glucose-Capillary: 156 mg/dL — ABNORMAL HIGH (ref 70–99)
Glucose-Capillary: 175 mg/dL — ABNORMAL HIGH (ref 70–99)
Glucose-Capillary: 190 mg/dL — ABNORMAL HIGH (ref 70–99)
Glucose-Capillary: 214 mg/dL — ABNORMAL HIGH (ref 70–99)
Glucose-Capillary: 208 mg/dL — ABNORMAL HIGH (ref 70–99)
Glucose-Capillary: 177 mg/dL — ABNORMAL HIGH (ref 70–99)
Glucose-Capillary: 166 mg/dL — ABNORMAL HIGH (ref 70–99)
Glucose-Capillary: 232 mg/dL — ABNORMAL HIGH (ref 70–99)
Glucose-Capillary: 273 mg/dL — ABNORMAL HIGH (ref 70–99)
Glucose-Capillary: 293 mg/dL — ABNORMAL HIGH (ref 70–99)

## 2013-09-12 LAB — CBC
HCT: 33.2 % — ABNORMAL LOW (ref 36.0–46.0)
Hemoglobin: 11.2 g/dL — ABNORMAL LOW (ref 12.0–15.0)
MCH: 29.8 pg (ref 26.0–34.0)
MCHC: 33.7 g/dL (ref 30.0–36.0)
MCV: 88.3 fL (ref 78.0–100.0)
Platelets: 184 10*3/uL (ref 150–400)
RBC: 3.76 MIL/uL — ABNORMAL LOW (ref 3.87–5.11)
RDW: 12.4 % (ref 11.5–15.5)
WBC: 9.6 10*3/uL (ref 4.0–10.5)

## 2013-09-12 LAB — BLOOD GAS, ARTERIAL
Acid-base deficit: 4.9 mmol/L — ABNORMAL HIGH (ref 0.0–2.0)
Bicarbonate: 18.3 mEq/L — ABNORMAL LOW (ref 20.0–24.0)
DRAWN BY: 22223
FIO2: 30 %
LHR: 16 {breaths}/min
MECHVT: 500 mL
O2 Saturation: 96.8 %
PCO2 ART: 26.4 mmHg — AB (ref 35.0–45.0)
PEEP: 5 cmH2O
PH ART: 7.455 — AB (ref 7.350–7.450)
Patient temperature: 37
TCO2: 16.7 mmol/L (ref 0–100)
pO2, Arterial: 80 mmHg (ref 80.0–100.0)

## 2013-09-12 LAB — COMPREHENSIVE METABOLIC PANEL
ALT: 13 U/L (ref 0–35)
ANION GAP: 14 (ref 5–15)
AST: 25 U/L (ref 0–37)
Albumin: 3.5 g/dL (ref 3.5–5.2)
Alkaline Phosphatase: 75 U/L (ref 39–117)
BILIRUBIN TOTAL: 0.6 mg/dL (ref 0.3–1.2)
BUN: 11 mg/dL (ref 6–23)
CHLORIDE: 101 meq/L (ref 96–112)
CO2: 19 meq/L (ref 19–32)
Calcium: 8.9 mg/dL (ref 8.4–10.5)
Creatinine, Ser: 0.73 mg/dL (ref 0.50–1.10)
GFR calc Af Amer: >90 mL/min (ref >90)
Glucose, Bld: 219 mg/dL — ABNORMAL HIGH (ref 70–99)
POTASSIUM: 3.7 meq/L (ref 3.7–5.3)
Sodium: 134 mEq/L — ABNORMAL LOW (ref 137–147)
Total Protein: 6.4 g/dL (ref 6.0–8.3)

## 2013-09-12 LAB — MRSA PCR SCREENING: MRSA by PCR: NEGATIVE

## 2013-09-12 LAB — TROPONIN I
Troponin I: <0.3 ng/mL (ref <0.30)
Troponin I: <0.3 ng/mL (ref <0.30)
Troponin I: <0.3 ng/mL (ref <0.30)

## 2013-09-12 LAB — PROTIME-INR
INR: 1.12 (ref 0.00–1.49)
Prothrombin Time: 14.4 seconds (ref 11.6–15.2)

## 2013-09-12 MED ORDER — LORAZEPAM 2 MG/ML IJ SOLN
4.0000 mg | Freq: Once | INTRAMUSCULAR | Status: AC
  Administered 2013-09-12: 4 mg via INTRAVENOUS

## 2013-09-12 MED ORDER — LEVETIRACETAM IN NACL 1000 MG/100ML IV SOLN
1000.0000 mg | Freq: Two times a day (BID) | INTRAVENOUS | Status: DC
  Administered 2013-09-12 – 2013-09-17 (×11): 1000 mg via INTRAVENOUS
  Filled 2013-09-12 (×14): qty 100

## 2013-09-12 MED ORDER — M.V.I. ADULT IV INJ
INJECTION | INTRAVENOUS | Status: AC
  Filled 2013-09-12: qty 10

## 2013-09-12 MED ORDER — BIOTENE DRY MOUTH MT LIQD
15.0000 mL | Freq: Four times a day (QID) | OROMUCOSAL | Status: DC
  Administered 2013-09-12 – 2013-09-20 (×29): 15 mL via OROMUCOSAL

## 2013-09-12 MED ORDER — LORAZEPAM 2 MG/ML IJ SOLN
0.0000 mg | Freq: Four times a day (QID) | INTRAMUSCULAR | Status: AC
  Filled 2013-09-12: qty 2

## 2013-09-12 MED ORDER — LORAZEPAM 2 MG/ML IJ SOLN: 0.0000 mg | Freq: Two times a day (BID) | INTRAMUSCULAR | Status: AC

## 2013-09-12 MED ORDER — IPRATROPIUM-ALBUTEROL 0.5-2.5 (3) MG/3ML IN SOLN: 3.0000 mL | RESPIRATORY_TRACT | Status: DC | PRN

## 2013-09-12 MED ORDER — ADULT MULTIVITAMIN W/MINERALS CH
1.0000 | ORAL_TABLET | Freq: Every day | ORAL | Status: DC
  Administered 2013-09-14 – 2013-09-20 (×7): 1 via ORAL
  Filled 2013-09-12 (×9): qty 1

## 2013-09-12 MED ORDER — ACETAMINOPHEN 650 MG RE SUPP
650.0000 mg | RECTAL | Status: DC | PRN
  Administered 2013-09-12: 650 mg via RECTAL
  Filled 2013-09-12: qty 1

## 2013-09-12 MED ORDER — PANTOPRAZOLE SODIUM 40 MG IV SOLR
40.0000 mg | INTRAVENOUS | Status: DC
  Administered 2013-09-12 – 2013-09-15 (×4): 40 mg via INTRAVENOUS
  Filled 2013-09-12 (×4): qty 40

## 2013-09-12 MED ORDER — PROPOFOL 10 MG/ML IV EMUL
5.0000 ug/kg/min | INTRAVENOUS | Status: DC
  Administered 2013-09-12 – 2013-09-13 (×4): 50 ug/kg/min via INTRAVENOUS
  Administered 2013-09-13: 60 ug/kg/min via INTRAVENOUS
  Administered 2013-09-13: 50 ug/kg/min via INTRAVENOUS
  Administered 2013-09-14 (×2): 60 ug/kg/min via INTRAVENOUS
  Filled 2013-09-12 (×4): qty 100
  Filled 2013-09-12: qty 200
  Filled 2013-09-12 (×3): qty 100

## 2013-09-12 MED ORDER — ONDANSETRON HCL 4 MG/2ML IJ SOLN: 4.0000 mg | Freq: Four times a day (QID) | INTRAMUSCULAR | Status: DC | PRN

## 2013-09-12 MED ORDER — IPRATROPIUM BROMIDE 0.02 % IN SOLN: 0.5000 mg | RESPIRATORY_TRACT | Status: DC | PRN

## 2013-09-12 MED ORDER — FOLIC ACID 5 MG/ML IJ SOLN
INTRAMUSCULAR | Status: AC
  Filled 2013-09-12: qty 0.2

## 2013-09-12 MED ORDER — THIAMINE HCL 100 MG/ML IJ SOLN
Freq: Once | INTRAVENOUS | Status: AC
  Administered 2013-09-12: 03:00:00 via INTRAVENOUS
  Filled 2013-09-12: qty 1000

## 2013-09-12 MED ORDER — LORAZEPAM 0.5 MG PO TABS: 1.0000 mg | ORAL_TABLET | Freq: Four times a day (QID) | ORAL | Status: DC | PRN

## 2013-09-12 MED ORDER — SODIUM CHLORIDE 0.9 % IJ SOLN
10.0000 mL | INTRAMUSCULAR | Status: DC | PRN
  Administered 2013-09-12: 30 mL

## 2013-09-12 MED ORDER — THIAMINE HCL 100 MG/ML IJ SOLN
100.0000 mg | Freq: Every day | INTRAMUSCULAR | Status: DC
  Administered 2013-09-12 – 2013-09-13 (×2): 100 mg via INTRAVENOUS
  Filled 2013-09-12 (×3): qty 2

## 2013-09-12 MED ORDER — CHLORHEXIDINE GLUCONATE 0.12 % MT SOLN
15.0000 mL | Freq: Two times a day (BID) | OROMUCOSAL | Status: DC
  Administered 2013-09-12 – 2013-09-20 (×17): 15 mL via OROMUCOSAL
  Filled 2013-09-12 (×19): qty 15

## 2013-09-12 MED ORDER — ENOXAPARIN SODIUM 30 MG/0.3ML ~~LOC~~ SOLN
30.0000 mg | SUBCUTANEOUS | Status: DC
  Administered 2013-09-13 – 2013-09-20 (×8): 30 mg via SUBCUTANEOUS
  Filled 2013-09-12 (×8): qty 0.3

## 2013-09-12 MED ORDER — ENOXAPARIN SODIUM 40 MG/0.4ML ~~LOC~~ SOLN
40.0000 mg | SUBCUTANEOUS | Status: DC
  Administered 2013-09-12: 40 mg via SUBCUTANEOUS
  Filled 2013-09-12: qty 0.4

## 2013-09-12 MED ORDER — LORAZEPAM 2 MG/ML IJ SOLN: 1.0000 mg | Freq: Four times a day (QID) | INTRAMUSCULAR | Status: DC | PRN

## 2013-09-12 MED ORDER — THIAMINE HCL 100 MG/ML IJ SOLN
INTRAMUSCULAR | Status: AC
  Filled 2013-09-12: qty 2

## 2013-09-12 MED ORDER — ALBUTEROL SULFATE (2.5 MG/3ML) 0.083% IN NEBU: 2.5000 mg | INHALATION_SOLUTION | RESPIRATORY_TRACT | Status: DC | PRN

## 2013-09-12 MED ORDER — FOLIC ACID 1 MG PO TABS
1.0000 mg | ORAL_TABLET | Freq: Every day | ORAL | Status: DC
  Administered 2013-09-13 – 2013-09-20 (×8): 1 mg via ORAL
  Filled 2013-09-12 (×8): qty 1

## 2013-09-12 MED ORDER — ONDANSETRON HCL 4 MG PO TABS
4.0000 mg | ORAL_TABLET | Freq: Four times a day (QID) | ORAL | Status: DC | PRN
  Administered 2013-09-18: 4 mg via ORAL
  Filled 2013-09-12: qty 1

## 2013-09-12 MED ORDER — VITAMIN B-1 100 MG PO TABS
100.0000 mg | ORAL_TABLET | Freq: Every day | ORAL | Status: DC
  Administered 2013-09-14 – 2013-09-20 (×7): 100 mg via ORAL
  Filled 2013-09-12 (×7): qty 1

## 2013-09-12 MED ORDER — SODIUM CHLORIDE 0.9 % IJ SOLN
10.0000 mL | Freq: Two times a day (BID) | INTRAMUSCULAR | Status: DC
  Administered 2013-09-12 (×2): 10 mL
  Administered 2013-09-13: 20 mL
  Administered 2013-09-13: 10 mL

## 2013-09-12 NOTE — Procedures (Signed)
[place] A. Merlene Laughter, MD     [email or web address]           HISTORY: This is a 58 year old presents with altered mental status and eye deviation suspicious for seizure activity.  MEDICATIONS: Scheduled Meds: . antiseptic oral rinse  15 mL Mouth Rinse QID  . chlorhexidine  15 mL Mouth Rinse BID  . [START ON 09/13/2013] enoxaparin (LOVENOX) injection  30 mg Subcutaneous Q24H  . folic acid  1 mg Oral Daily  . levETIRAcetam  1,000 mg Intravenous Q12H  . LORazepam  0-4 mg Intravenous Q6H   Followed by  . [START ON 09/14/2013] LORazepam  0-4 mg Intravenous Q12H  . multivitamin with minerals  1 tablet Oral Daily  . pantoprazole (PROTONIX) IV  40 mg Intravenous Q24H  . sodium chloride  10-40 mL Intracatheter Q12H  . thiamine  100 mg Oral Daily   Or  . thiamine  100 mg Intravenous Daily   Continuous Infusions: . dextrose 5 % and 0.45% NaCl 50 mL/hr at 09/12/13 1951  . propofol 50 mcg/kg/min (09/12/13 1800)   PRN Meds:.ipratropium-albuterol, LORazepam, LORazepam, ondansetron (ZOFRAN) IV, ondansetron, sodium chloride  Prior to Admission medications   Medication Sig Start Date End Date Taking? Authorizing Provider  albuterol (PROAIR HFA) 108 (90 BASE) MCG/ACT inhaler Inhale 2 puffs into the lungs every 6 (six) hours as needed for wheezing or shortness of breath.    Historical Provider, MD  atorvastatin (LIPITOR) 40 MG tablet Take 1 tablet (40 mg total) by mouth daily at 6 PM. 10/29/12   Nishant Dhungel, MD  doxepin (SINEQUAN) 50 MG capsule Take 50 mg by mouth at bedtime.    Historical Provider, MD  ergocalciferol (VITAMIN D2) 50000 UNITS capsule Take 50,000 Units by mouth daily.    Historical Provider, MD  esomeprazole (NEXIUM) 40 MG capsule Take 40 mg by mouth daily.     Historical Provider, MD  feeding supplement (GLUCERNA SHAKE) LIQD Take 237 mLs by mouth 3 (three) times daily between meals. 10/29/12   Nishant Dhungel, MD  folic acid (FOLVITE) 1 MG tablet Take 1 mg by  mouth daily.    Historical Provider, MD  hyoscyamine (LEVSIN, ANASPAZ) 0.125 MG tablet Take 0.125 mg by mouth 4 (four) times daily - after meals and at bedtime.    Historical Provider, MD  insulin aspart (NOVOLOG FLEXPEN) 100 UNIT/ML SOPN FlexPen Inject 5 Units into the skin 3 (three) times daily with meals. 10/29/12   Nishant Dhungel, MD  insulin glargine (LANTUS) 100 UNIT/ML injection Inject 0.24 mLs (24 Units total) into the skin at bedtime. 10/29/12   Nishant Dhungel, MD  lisinopril (PRINIVIL,ZESTRIL) 10 MG tablet Take 0.5 tablets (5 mg total) by mouth daily. 03/20/13   Ripudeep Krystal Eaton, MD  lubiprostone (AMITIZA) 8 MCG capsule Take 8 mcg by mouth 2 (two) times daily with a meal.    Historical Provider, MD  megestrol (MEGACE) 40 MG tablet Take 40 mg by mouth 2 (two) times daily.    Historical Provider, MD  metoprolol (LOPRESSOR) 50 MG tablet Take 50 mg by mouth 2 (two) times daily.    Historical Provider, MD  Multiple Vitamin (MULTIVITAMIN WITH MINERALS) TABS tablet Take 1 tablet by mouth daily.    Historical Provider, MD  oxycodone (ROXICODONE) 30 MG immediate release tablet Take 30 mg by mouth every 4 (four) hours as needed for pain.    Historical Provider, MD  potassium bicarbonate (K-LYTE) 25 MEQ disintegrating tablet Take 25 mEq by mouth daily.  Historical Provider, MD  thiamine 100 MG tablet Take 1 tablet (100 mg total) by mouth daily. 10/29/12   Nishant Dhungel, MD      ANALYSIS: A 16 channel recording using standard 10 20 measurements is conducted for 27 minutes. The background activity shows diffuse generalized slowing throughout the recording. The highest background activity is 3-1/2-4 Hz. Sleep activity is observed with K complexes, spindles and frequent vertex sharp wave activities. The background delta activity becomes rhythmic at times. No clear focal slowing is seen. There is no epileptiform activity observed.   IMPRESSION: 1. This recording is grossly abnormal showing severe  generalized slowing indicating a generalized encephalopathy. There are no epileptiform activities observed.      Jasneet Schobert A. Merlene Laughter, M.D.  Diplomate, Tax adviser of Psychiatry and Neurology ( Neurology).

## 2013-09-12 NOTE — Progress Notes (Signed)
Inpatient Diabetes Program Recommendations  AACE/ADA: New Consensus Statement on Inpatient Glycemic Control (2013)  Target Ranges:  Prepandial:   less than 140 mg/dL      Peak postprandial:   less than 180 mg/dL (1-2 hours)      Critically ill patients:  140 - 180 mg/dL   Results for Erica [name], Erica [name] (MRN [id number]) as of 09/12/2013 11:19  Ref. Range 09/12/2013 02:32 09/12/2013 03:04 09/12/2013 04:10 09/12/2013 08:32 09/12/2013 11:04  Glucose-Capillary Latest Range: 70-99 mg/dL 190 (H) 214 (H) 208 (H) 177 (H) 166 (H)   Diabetes history: DM2 Outpatient Diabetes medications: Lantus 24 units QHS, Novolog 5 units TID with meals Current orders for Inpatient glycemic control: CBGs Q2H  Inpatient Diabetes Program Recommendations Correction (SSI): Now that glucose is consistently greater than 150 mg/dl without hypoglycemia treatment, please consider ordering Novolog sensitive correction scale Q4H.  Thanks, Barnie Alderman, RN, MSN, CCRN Diabetes Coordinator Inpatient Diabetes Program [phone] (Team Pager) [phone] ([facility] office) [phone] [facility] office)

## 2013-09-12 NOTE — Consult Note (Signed)
Consult requested by: Hospitalists Consult requested for respiratory failure:  HPI: This is a 59 year old African American female who was brought to the emergency department by EMS. Apparently her family found her unresponsive in linear mass was called her blood sugar which can't. She was given D50 and her blood sugar came up but she was having posturing with a fixed rightward gaze and was intubated. After that she became much more responsive and became very agitated and had to be started on propofol. This morning she remains on propofol remains intubated. She continues to have some trouble stabilizing her blood sugar. There was some concern about alcohol abuse but Dr. Verlon Au and is discussed this with her signed who says that she has not had alcohol since last admission. This morning her blood gas shows a PCO2 is very low and she's on a relatively high respiratory rate but only 30% oxygen so I think we can reduce her respiratory rate safely.  Past Medical History  Diagnosis Date  . Chronic diarrhea   . History of kidney stones   . Heart murmur   . Neuropathy     Hx: of  . GERD (gastroesophageal reflux disease)   . Diabetes mellitus     fasting blood sugar 110-120s     Family History  Problem Relation Age of Onset  . Diabetes Sister      History   Social History  . Marital Status: Single    Spouse Name: N/A    Number of Children: N/A  . Years of Education: N/A   Social History Main Topics  . Smoking status: Current Every Day Smoker    Types: Cigarettes  . Smokeless tobacco: Current User    Types: Snuff  . Alcohol Use: 0.0 oz/week     Comment: weekly  . Drug Use: No  . Sexual Activity: No     Comment: last drink 05/19/12 states 2 drinks 3 x's a week   Other Topics Concern  . None   Social History Narrative  . None     ROS: Unobtainable    Objective: Vital signs in last 24 hours: Temp:  [96.7 F (35.9 C)-98.3 F (36.8 C)] 98.3 F (36.8 C) (07/28 0009) Pulse  Rate:  [63-94] 92 (07/28 0630) Resp:  [12-26] 15 (07/28 0630) BP: (100-180)/(53-114) 121/67 mmHg (07/28 0630) SpO2:  [97 %-100 %] 100 % (07/28 0630) FiO2 (%):  [30 %-50 %] 30 % (07/28 0714) Weight:  [40.82 kg (89 lb 15.9 oz)-40.824 kg (90 lb)] 40.82 kg (89 lb 15.9 oz) (07/28 0200) Weight change:  Last BM Date: 09/12/13  Intake/Output from previous day: 07/27 0701 - 07/28 0700 In: 1593.5 [I.V.:1593.5] Out: -   PHYSICAL EXAM She is intubated and sedated. Her pupils react. Her nose and throat are clear. Her neck is supple. Her chest shows some rhonchi bilaterally. Her heart is regular. Her abdomen is soft. She has 2+ edema of the lower extremities bilaterally. Central nervous system exam is really not able to be assessed because she's on the ventilator and sedated  Lab Results: Basic Metabolic Panel:  Recent Labs  09/11/13 2122 09/12/13 0446  NA 138 134*  K 3.5* 3.7  CL 104 101  CO2 20 19  GLUCOSE 51* 219*  BUN 13 11  CREATININE 0.83 0.73  CALCIUM 9.1 8.9   Liver Function Tests:  Recent Labs  09/11/13 2122 09/12/13 0446  AST 26 25  ALT 14 13  ALKPHOS 75 75  BILITOT 0.3 0.6  PROT 6.8  6.4  ALBUMIN 3.6 3.5   No results found for this basename: LIPASE, AMYLASE,  in the last 72 hours  Recent Labs  09/11/13 2123  AMMONIA 49   CBC:  Recent Labs  09/11/13 2122 09/12/13 0446  WBC 7.5 9.6  NEUTROABS 4.9  --   HGB 10.8* 11.2*  HCT 31.5* 33.2*  MCV 89.2 88.3  PLT 213 184   Cardiac Enzymes:  Recent Labs  09/11/13 2122 09/12/13 0044 09/12/13 0703  TROPONINI <0.30 <0.30 <0.30   BNP: No results found for this basename: PROBNP,  in the last 72 hours D-Dimer: No results found for this basename: DDIMER,  in the last 72 hours CBG:  Recent Labs  09/12/13 0135 09/12/13 0152 09/12/13 0232 09/12/13 0304 09/12/13 0410 09/12/13 0832  GLUCAP 156* 175* 190* 214* 208* 177*   Hemoglobin A1C: No results found for this basename: HGBA1C,  in the last 72  hours Fasting Lipid Panel: No results found for this basename: CHOL, HDL, LDLCALC, TRIG, CHOLHDL, LDLDIRECT,  in the last 72 hours Thyroid Function Tests: No results found for this basename: TSH, T4TOTAL, FREET4, T3FREE, THYROIDAB,  in the last 72 hours Anemia Panel: No results found for this basename: VITAMINB12, FOLATE, FERRITIN, TIBC, IRON, RETICCTPCT,  in the last 72 hours Coagulation:  Recent Labs  09/11/13 2122 09/12/13 0446  LABPROT 14.0 14.4  INR 1.08 1.12   Urine Drug Screen: Drugs of Abuse     Component Value Date/Time   LABOPIA NONE DETECTED 09/11/2013 2210   COCAINSCRNUR NONE DETECTED 09/11/2013 2210   LABBENZ POSITIVE* 09/11/2013 2210   AMPHETMU NONE DETECTED 09/11/2013 2210   THCU NONE DETECTED 09/11/2013 2210   LABBARB NONE DETECTED 09/11/2013 2210    Alcohol Level:  Recent Labs  09/11/13 2122  ETH <11   Urinalysis:  Recent Labs  09/11/13 2210  COLORURINE YELLOW  LABSPEC 1.015  PHURINE 5.5  GLUCOSEU 500*  HGBUR TRACE*  BILIRUBINUR NEGATIVE  KETONESUR NEGATIVE  PROTEINUR NEGATIVE  UROBILINOGEN 0.2  NITRITE NEGATIVE  LEUKOCYTESUR NEGATIVE   Misc. Labs:   ABGS:  Recent Labs  09/12/13 0528  PHART 7.455*  PO2ART 80.0  TCO2 16.7  HCO3 18.3*     MICROBIOLOGY: Recent Results (from the past 240 hour(s))  MRSA PCR SCREENING     Status: None   Collection Time    09/12/13 12:15 AM      Result Value Ref Range Status   MRSA by PCR NEGATIVE  NEGATIVE Final   Comment:            The GeneXpert MRSA Assay (FDA     approved for NASAL specimens     only), is one component of a     comprehensive MRSA colonization     surveillance program. It is not     intended to diagnose MRSA     infection nor to guide or     monitor treatment for     MRSA infections.    Studies/Results: Abd 1 View (kub)  09/12/2013   CLINICAL DATA:  NG tube placement.  Patient found unresponsive.  EXAM: ABDOMEN - 1 VIEW  COMPARISON:  07/20/2013  FINDINGS: Marked gaseous  distention of the stomach. Enteric tube tip is in the upper mid abdomen, likely in the upper stomach but the proximal side hole is at the EG junction, likely in the distal esophagus. Advancement is suggested. Calcifications in the upper abdomen appear to represent pancreatic calcification and probably suggests chronic pancreatitis.  IMPRESSION: Enteric tube  tip appears to be in the upper stomach with proximal side hole likely in the distal esophagus. Advancement is suggested.   Electronically Signed   By: Lucienne Capers M.D.   On: 09/12/2013 01:23   Ct Head Wo Contrast  09/11/2013   CLINICAL DATA:  Altered mental status.  The patient is unresponsive.  EXAM: CT HEAD WITHOUT CONTRAST  TECHNIQUE: Contiguous axial images were obtained from the base of the skull through the vertex without intravenous contrast.  COMPARISON:  07/08/2011  FINDINGS: No mass lesion. No midline shift. No acute hemorrhage or hematoma. No extra-axial fluid collections. No evidence of acute infarction. There is mild diffuse atrophy with no ventricular dilatation. Brain parenchyma is otherwise normal. Osseous structures are normal.  IMPRESSION: No significant abnormality.  Slight diffuse atrophy, unchanged.   Electronically Signed   By: Rozetta Nunnery M.D.   On: 09/11/2013 22:23   Dg Chest Portable 1 View  09/11/2013   CLINICAL DATA:  Post intubation.  EXAM: PORTABLE CHEST - 1 VIEW  COMPARISON:  04/28/2013  FINDINGS: Endotracheal tube is been placed with tip measuring 3.1 cm above the carina. Enteric tube was placed with tip over the EG junction, likely in the distal esophagus. Proximal side hole is in the mid esophageal region. Normal heart size and pulmonary vascularity. No focal airspace disease or consolidation in the lungs. No pneumothorax. No blunting of costophrenic angles.  IMPRESSION: Endotracheal tube tip measures 3.1 cm above the carinal. Enteric tube tip appears to be in the distal esophagus.   Electronically Signed   By: Lucienne Capers M.D.   On: 09/11/2013 22:11    Medications:  Prior to Admission:  Prescriptions prior to admission  Medication Sig Dispense Refill  . albuterol (PROAIR HFA) 108 (90 BASE) MCG/ACT inhaler Inhale 2 puffs into the lungs every 6 (six) hours as needed for wheezing or shortness of breath.      Marland Kitchen atorvastatin (LIPITOR) 40 MG tablet Take 1 tablet (40 mg total) by mouth daily at 6 PM.  30 tablet  0  . doxepin (SINEQUAN) 50 MG capsule Take 50 mg by mouth at bedtime.      . ergocalciferol (VITAMIN D2) 50000 UNITS capsule Take 50,000 Units by mouth daily.      Marland Kitchen esomeprazole (NEXIUM) 40 MG capsule Take 40 mg by mouth daily.       . feeding supplement (GLUCERNA SHAKE) LIQD Take 237 mLs by mouth 3 (three) times daily between meals.  30 Can  2  . folic acid (FOLVITE) 1 MG tablet Take 1 mg by mouth daily.      . hyoscyamine (LEVSIN, ANASPAZ) 0.125 MG tablet Take 0.125 mg by mouth 4 (four) times daily - after meals and at bedtime.      . insulin aspart (NOVOLOG FLEXPEN) 100 UNIT/ML SOPN FlexPen Inject 5 Units into the skin 3 (three) times daily with meals.  10 mL  3  . insulin glargine (LANTUS) 100 UNIT/ML injection Inject 0.24 mLs (24 Units total) into the skin at bedtime.  10 mL  12  . lisinopril (PRINIVIL,ZESTRIL) 10 MG tablet Take 0.5 tablets (5 mg total) by mouth daily.      Marland Kitchen lubiprostone (AMITIZA) 8 MCG capsule Take 8 mcg by mouth 2 (two) times daily with a meal.      . megestrol (MEGACE) 40 MG tablet Take 40 mg by mouth 2 (two) times daily.      . metoprolol (LOPRESSOR) 50 MG tablet Take 50 mg by mouth  2 (two) times daily.      . Multiple Vitamin (MULTIVITAMIN WITH MINERALS) TABS tablet Take 1 tablet by mouth daily.      Marland Kitchen oxycodone (ROXICODONE) 30 MG immediate release tablet Take 30 mg by mouth every 4 (four) hours as needed for pain.      . potassium bicarbonate (K-LYTE) 25 MEQ disintegrating tablet Take 25 mEq by mouth daily.       Marland Kitchen thiamine 100 MG tablet Take 1 tablet (100 mg total) by  mouth daily.  30 tablet  0   Scheduled: . antiseptic oral rinse  15 mL Mouth Rinse QID  . chlorhexidine  15 mL Mouth Rinse BID  . enoxaparin (LOVENOX) injection  40 mg Subcutaneous Q24H  . folic acid  1 mg Oral Daily  . hydrogen peroxide      . levETIRAcetam  1,000 mg Intravenous Q12H  . lidocaine (cardiac) 100 mg/65ml      . LORazepam  0-4 mg Intravenous Q6H   Followed by  . [START ON 09/14/2013] LORazepam  0-4 mg Intravenous Q12H  . multivitamin with minerals  1 tablet Oral Daily  . rocuronium      . thiamine  100 mg Oral Daily   Or  . thiamine  100 mg Intravenous Daily   Continuous: . dextrose 5 % and 0.45% NaCl 50 mL/hr at 09/12/13 0352  . propofol 50 mcg/kg/min (09/12/13 0641)   [id number], LORazepam, LORazepam, ondansetron (ZOFRAN) IV, ondansetron  Assesment: She has acute respiratory failure on ventilator support. She had metabolic encephalopathy perhaps combined hypoglycemia and perhaps seizures. However she could have focal neurological findings simply from her hypoglycemia. Her blood sugar has stabilized and has been somewhat difficult to do so. She apparently has a history of smoking although I don't know how much she probably has some element of COPD. Principal Problem:   Acute respiratory failure requiring reintubation Active Problems:   Hypoglycemia   Metabolic encephalopathy   Alcohol abuse   H/O chronic pancreatitis   Type II or unspecified type diabetes mellitus without mention of complication, uncontrolled   probable Seizures due to metabolic disorder    Plan: Since it appears that she has not had glaucoma withdrawal I think we can proceed with an attempt to see how she does with less sedation. She has rhonchi bilaterally on her exam so we need to check and make sure that she has not aspirated    LOS: 1 day   Leeandre Nordling L 09/12/2013, 8:41 AM

## 2013-09-12 NOTE — Progress Notes (Signed)
Note: This document was prepared with digital dictation and possible smart phrase technology. Any transcriptional errors that result from this process are unintentional.   Erica [name] [id number] DOB: 1955-11-11 DOA: 09/11/2013 PCP: Erica Graves, DO  Brief narrative:  58 y/o ?, h/o poorly controlled IDDM for 20 yrs, multiple prior [4-6] admits DKA , Etoh abuse, Prior h/o NSTEMI/Demand ischemia 9/14,  Son on telephone noted sugar had "bottomed out"  to a blood glucose of 10 .He last saw her wnl ~35 min the EMS truck came .  She apparently was posturing and had R gaze , given narcan-no effect.  Not been drinking recently or anything along those lines-he states her normal sugars are 200's  Past medical history-As per Problem list Chart reviewed as below-   Consultants:  Pulmonology  Procedures:  IO RLE 7/28  Vent Mode:  [-] PRVC FiO2 (%):  [30 %-50 %] 30 % Set Rate:  [16 bmp] 16 bmp Vt Set:  [500 mL] 500 mL PEEP:  [5 cmH20] 5 cmH20 Plateau Pressure:  [17 cmH20-20 cmH20] 17 cmH20   Antibiotics:  None currently   Subjective  Patient intubated and sedated unable to get review of systems. Nursing reports no further issues at present    Objective    Interim History: Reviewed  Telemetry:    Objective: Filed Vitals:   09/12/13 0545 09/12/13 0600 09/12/13 0615 09/12/13 0630  BP: 103/53 106/62 112/68 121/67  Pulse: 93 93 92 92  Temp:      TempSrc:      Resp: 18 17 16 15   Height:      Weight:      SpO2: 98% 100% 100% 100%    Intake/Output Summary (Last 24 hours) at 09/12/13 0825 Last data filed at 09/12/13 0500  Gross per 24 hour  Intake 1593.46 ml  Output      0 ml  Net 1593.46 ml    Exam:  General: Sedated on ventilator of Cardiovascular: S1-S2 tachycardic regular rate rhythm Respiratory: Clinically clear no to Abdomen: Soft nontender nondistended Skin: I/o noted RLE Neuro: sleepy   Data Reviewed: Basic Metabolic Panel:  Recent  Labs Lab 09/11/13 2122 09/12/13 0446  NA 138 134*  K 3.5* 3.7  CL 104 101  CO2 20 19  GLUCOSE 51* 219*  BUN 13 11  CREATININE 0.83 0.73  CALCIUM 9.1 8.9   Liver Function Tests:  Recent Labs Lab 09/11/13 2122 09/12/13 0446  AST 26 25  ALT 14 13  ALKPHOS 75 75  BILITOT 0.3 0.6  PROT 6.8 6.4  ALBUMIN 3.6 3.5   No results found for this basename: LIPASE, AMYLASE,  in the last 168 hours  Recent Labs Lab 09/11/13 2123  AMMONIA 49   CBC:  Recent Labs Lab 09/11/13 2122 09/12/13 0446  WBC 7.5 9.6  NEUTROABS 4.9  --   HGB 10.8* 11.2*  HCT 31.5* 33.2*  MCV 89.2 88.3  PLT 213 184   Cardiac Enzymes:  Recent Labs Lab 09/11/13 2122 09/12/13 0044 09/12/13 0703  TROPONINI <0.30 <0.30 <0.30   BNP: No components found with this basename: POCBNP,  CBG:  Recent Labs Lab 09/12/13 0135 09/12/13 0152 09/12/13 0232 09/12/13 0304 09/12/13 0410  GLUCAP 156* 175* 190* 214* 208*    Recent Results (from the past 240 hour(s))  MRSA PCR SCREENING     Status: None   Collection Time    09/12/13 12:15 AM      Result Value Ref Range Status  MRSA by PCR NEGATIVE  NEGATIVE Final   Comment:            The GeneXpert MRSA Assay (FDA     approved for NASAL specimens     only), is one component of a     comprehensive MRSA colonization     surveillance program. It is not     intended to diagnose MRSA     infection nor to guide or     monitor treatment for     MRSA infections.     Studies:              All Imaging reviewed and is as per above notation   Scheduled Meds: . antiseptic oral rinse  15 mL Mouth Rinse QID  . chlorhexidine  15 mL Mouth Rinse BID  . enoxaparin (LOVENOX) injection  40 mg Subcutaneous Q24H  . folic acid  1 mg Oral Daily  . hydrogen peroxide      . levETIRAcetam  1,000 mg Intravenous Q12H  . lidocaine (cardiac) 100 mg/105ml      . LORazepam  0-4 mg Intravenous Q6H   Followed by  . [START ON 09/14/2013] LORazepam  0-4 mg Intravenous Q12H  .  multivitamin with minerals  1 tablet Oral Daily  . rocuronium      . thiamine  100 mg Oral Daily   Or  . thiamine  100 mg Intravenous Daily   Continuous Infusions: . dextrose 5 % and 0.45% NaCl 50 mL/hr at 09/12/13 0352  . propofol 50 mcg/kg/min (09/12/13 0641)     Assessment/Plan: 1. Toxic metabolic encephalopathy with seizures-likely 2/71m to profound hypoglycemia with seizures vs ETOH withdrawal. Patient's son who lives with her questions states that she has not had alcohol recently so likely she had profound hypoglycemia with resultant seizures?  we will continue propofol for sedation but likely decrease her sedation requirements and see how she behaves and get another blood gas at that time. We will also rule out aspiration with chest x-ray 7/20  2.  Seizure-likely secondary to profound hypoglycemia-monitor. See above.  We have consulted neurology to assess the patient in consult however this may be all secondary to hypoglycemia. Continue Keppra 1000 twice a day until neurology sees the patient -EEG is pending on this patient 3.  Acute respiratory failure-secondary possibly to hypoglycemia causing a seizure-deferred management to pulmonology. Start weaning 4. Labile diabetes mellitus-we will keep her on D5/C.'s saline 50 cc per hour reassess. We will recheck blood sugars every hour for the next 6 hours.  Is n.p.o. currently still regular medications were held 5. History hypertension-lisinopril 0.5 daily on hold, metoprolol 50 twice a day on hold 6. Possible history COPD-continue albuterol when necessary on ventilator every 4 hours 7. History of alcohol use in the past-apparently not using no. Monitor and CIWA protocol for now. If above 10 may need to continue propofol. 8. Prior NST MI-currently stable.  Code Status:  full  Family Communication:  discussed with son at bedside  Disposition Plan:  inpatient    Erica Griffes, MD  [facility] Pager [phone] 09/12/2013, 8:25 AM     LOS: 1 day

## 2013-09-12 NOTE — Progress Notes (Signed)
Patient having a procedure when tech was at [facility] to perform EEG, EEG will be performed tomorrow 7/29

## 2013-09-12 NOTE — Progress Notes (Signed)
UR chart review completed.  

## 2013-09-12 NOTE — Progress Notes (Signed)
INITIAL NUTRITION ASSESSMENT  DOCUMENTATION CODES Per approved criteria  -Underweight   INTERVENTION: -Recommend initiate nutrition support if unable to wean within 48 hours of intubation: Consider starting Vital 1.2 AF @ 20 ml/hr via OGT and increase 10 ml/hr every 12 hours to goal rate of 30 ml/hr Regimen at goal rate will provide 864 kcals (1186 kcals with current propofol rate), 54 grams protein, and 584 ml fluid. This will meet 75% of estimated kcal needs (100% of needs with current propofol rate) and 100% of estimated protein needs.   NUTRITION DIAGNOSIS: Inadequate oral intake related to inability to eat as evidenced by NPO.   Goal: Pt will meet >90% of estimated nutritional needs  Monitor:  Respiratory status, initiation/advancement/tolerance of nutrition support measures, weight changes, labs, I/O's  Reason for Assessment: VDRF, low BMI  58 y.o. female  Admitting Dx: Acute respiratory failure requiring reintubation  ASSESSMENT: Pt with hx of iddm found down by her family tonight who called ems. ems found pt her glucose was 10. On arrival to ED after given an amp of D50, glucose was up to 300 (per ed records). Pt was posturing on arrival with fixed rightward gaze, but no overt seizure activity. Pt was given narcan with no response. She was intubated to protect her airway, she had good vital signs. Soon after intubation, she started becoming more responsive, moving all of her extemities and fighting the vent at which point propofol gtt was initiated. No family in ED, or icu. Pt ED w/u reveals a normal ct head with no evidence of acute abnormality, normal electolytes and renal function. Neg trop, neg cxr and ua. uds pos for benzos. etoh level neg. hgb 10.8. Wbc normal. Glucose repeatedly rises some then dropping, has received 3 amps d50 and now on d51/2ns gtt. Pt is moderately sedated on ventilator.  Patient is currently intubated on ventilator support. Pt has OGT. MV: 5.8  L/min Temp (24hrs), Avg:97.4 F (36.3 C), Min:96.7 F (35.9 C), Max:98.3 F (36.8 C)  Propofol: 12.2 ml/hr (322 kcals)  Unable to complete nutrition focused physical exam at this time.   Height: Ht Readings from Last 1 Encounters:  09/12/13 5' (1.524 m)    Weight: Wt Readings from Last 1 Encounters:  09/12/13 89 lb 15.9 oz (40.82 kg)    Ideal Body Weight: 100#  % Ideal Body Weight: 89%  Wt Readings from Last 10 Encounters:  09/12/13 89 lb 15.9 oz (40.82 kg)  05/23/13 91 lb (41.277 kg)  03/18/13 94 lb (42.638 kg)  12/07/12 98 lb (44.453 kg)  10/29/12 87 lb 8 oz (39.69 kg)  10/05/12 80 lb (36.288 kg)  09/24/12 90 lb (40.824 kg)  09/11/12 95 lb 3.8 oz (43.2 kg)  07/03/12 100 lb (45.36 kg)  06/09/12 106 lb 14.4 oz (48.49 kg)   Usual Body Weight: 113#  % Usual Body Weight: 79%  BMI:  Body mass index is 17.58 kg/(m^2). Underweight  Estimated Nutritional Needs: Kcal: 1150.1 Protein: 49-57 grams Fluid: >1.1 L  Skin: WDL  Diet Order: NPO  EDUCATION NEEDS: -Education not appropriate at this time   Intake/Output Summary (Last 24 hours) at 09/12/13 0921 Last data filed at 09/12/13 0800  Gross per 24 hour  Intake 1593.46 ml  Output      0 ml  Net 1593.46 ml    Last BM: 09/12/13  Labs:   Recent Labs Lab 09/11/13 2122 09/12/13 0446  NA 138 134*  K 3.5* 3.7  CL 104 101  CO2 20  19  BUN 13 11  CREATININE 0.83 0.73  CALCIUM 9.1 8.9  GLUCOSE 51* 219*    CBG (last 3)   Recent Labs  09/12/13 0304 09/12/13 0410 09/12/13 0832  GLUCAP 214* 208* 177*   Lab Results  Component Value Date   HGBA1C 9.5* 10/25/2012   Scheduled Meds: . antiseptic oral rinse  15 mL Mouth Rinse QID  . chlorhexidine  15 mL Mouth Rinse BID  . enoxaparin (LOVENOX) injection  40 mg Subcutaneous Q24H  . folic acid  1 mg Oral Daily  . hydrogen peroxide      . levETIRAcetam  1,000 mg Intravenous Q12H  . lidocaine (cardiac) 100 mg/36ml      . LORazepam  0-4 mg Intravenous Q6H    Followed by  . [START ON 09/14/2013] LORazepam  0-4 mg Intravenous Q12H  . multivitamin with minerals  1 tablet Oral Daily  . rocuronium      . thiamine  100 mg Oral Daily   Or  . thiamine  100 mg Intravenous Daily    Continuous Infusions: . dextrose 5 % and 0.45% NaCl 50 mL/hr at 09/12/13 0352  . propofol 50 mcg/kg/min (09/12/13 [id number])    Past Medical History  Diagnosis Date  . Chronic diarrhea   . History of kidney stones   . Heart murmur   . Neuropathy     Hx: of  . GERD (gastroesophageal reflux disease)   . Diabetes mellitus     fasting blood sugar 110-120s    Past Surgical History  Procedure Laterality Date  . Colonoscopy  02/24/2010  . Tubal ligation    . Multiple extractions with alveoloplasty N/A 06/13/2012    Procedure: MULTIPLE EXTRACION WITH ALVEOLOPLASTY EXTRACT: 18, 19, 20, 21, 22, 24, 25, 27, 28, 29, 30, 31;  Surgeon: Gae Bon, DDS;  Location: [facility];  Service: Oral Surgery;  Laterality: N/A;    Kamilah Correia A. Jimmye Norman, RD, LDN Pager: [phone]

## 2013-09-12 NOTE — Consult Note (Signed)
[place] A. Merlene Laughter, MD     [email or web address]          KIMBRELY BUCKEL is an 58 y.o. female.   ASSESSMENT/PLAN: 1. Encephalopathy due to severe hyperglycemia. Unfortunately, there may be some irreversible brain damage that may have been done. She does seem to have eye deviation and posturing on examination although she has no posturing at this time. There is increased risk of a hypoglycemic induced seizures. We'll continue with the Keppra. An EEG will be obtained. This EEG is unremarkable, we will reduce the dose of the Keppra.   This is a 58 year old black female who was found unresponsive. She was noted to have a glucose of 10. It is estimated that she may have been unresponsive for about up to 30 minutes. The history is difficult however. She was noted in the emergency room to have deviation of her eyes to the right. It's unclear if she had any clear generalized tonic tonic seizures however. She was loaded with Keppra and intubated for airway protection. She has been on propofol. No obvious seizures are reported.  GENERAL: The patient is intubated. The examination is done about half No after propofol has been held.  HEENT: Supple. Atraumatic normocephalic.   ABDOMEN: soft  EXTREMITIES: No edema   BACK: Normal.  SKIN: Normal by inspection.    MENTAL STATUS: She lays in bed with eyes closed. She opens her eyes to deep painful stimuli. She focuses and attends but does not follow commands.  CRANIAL NERVES: Pupils are equal, round and reactive to light; extra ocular movements are full; coronary reflexes are intact. Cough and gag reflexes are intact. Facial muscle strength is symmetric.  MOTOR: She moves both sides well. No clear posturing is noted at this time.  COORDINATION: There is no dysmetria noted. No tremors.  REFLEXES: Deep tendon reflexes are symmetrical and normal. Babinski reflexes are equivocal bilaterally.   SENSATION: She reports is painful  some light bilaterally.   Past Medical History  Diagnosis Date  . Chronic diarrhea   . History of kidney stones   . Heart murmur   . Neuropathy     Hx: of  . GERD (gastroesophageal reflux disease)   . Diabetes mellitus     fasting blood sugar 110-120s    Past Surgical History  Procedure Laterality Date  . Colonoscopy  02/24/2010  . Tubal ligation    . Multiple extractions with alveoloplasty N/A 06/13/2012    Procedure: MULTIPLE EXTRACION WITH ALVEOLOPLASTY EXTRACT: 18, 19, 20, 21, 22, 24, 25, 27, 28, 29, 30, 31;  Surgeon: Gae Bon, DDS;  Location: [facility];  Service: Oral Surgery;  Laterality: N/A;    Family History  Problem Relation Age of Onset  . Diabetes Sister     Social History:  reports that she has been smoking Cigarettes.  She has been smoking about 0.00 packs per day. Her smokeless tobacco use includes Snuff. She reports that she drinks alcohol. She reports that she does not use illicit drugs.  Allergies:  Allergies  Allergen Reactions  . Aspirin Palpitations    Medications: Prior to Admission medications   Medication Sig Start Date End Date Taking? Authorizing Provider  albuterol (PROAIR HFA) 108 (90 BASE) MCG/ACT inhaler Inhale 2 puffs into the lungs every 6 (six) hours as needed for wheezing or shortness of breath.    Historical Provider, MD  atorvastatin (LIPITOR) 40 MG tablet Take 1 tablet (40 mg total) by mouth daily at 6 PM. 10/29/12  Nishant Dhungel, MD  doxepin (SINEQUAN) 50 MG capsule Take 50 mg by mouth at bedtime.    Historical Provider, MD  ergocalciferol (VITAMIN D2) 50000 UNITS capsule Take 50,000 Units by mouth daily.    Historical Provider, MD  esomeprazole (NEXIUM) 40 MG capsule Take 40 mg by mouth daily.     Historical Provider, MD  feeding supplement (GLUCERNA SHAKE) LIQD Take 237 mLs by mouth 3 (three) times daily between meals. 10/29/12   Nishant Dhungel, MD  folic acid (FOLVITE) 1 MG tablet Take 1 mg by mouth daily.    Historical  Provider, MD  hyoscyamine (LEVSIN, ANASPAZ) 0.125 MG tablet Take 0.125 mg by mouth 4 (four) times daily - after meals and at bedtime.    Historical Provider, MD  insulin aspart (NOVOLOG FLEXPEN) 100 UNIT/ML SOPN FlexPen Inject 5 Units into the skin 3 (three) times daily with meals. 10/29/12   Nishant Dhungel, MD  insulin glargine (LANTUS) 100 UNIT/ML injection Inject 0.24 mLs (24 Units total) into the skin at bedtime. 10/29/12   Nishant Dhungel, MD  lisinopril (PRINIVIL,ZESTRIL) 10 MG tablet Take 0.5 tablets (5 mg total) by mouth daily. 03/20/13   Ripudeep Krystal Eaton, MD  lubiprostone (AMITIZA) 8 MCG capsule Take 8 mcg by mouth 2 (two) times daily with a meal.    Historical Provider, MD  megestrol (MEGACE) 40 MG tablet Take 40 mg by mouth 2 (two) times daily.    Historical Provider, MD  metoprolol (LOPRESSOR) 50 MG tablet Take 50 mg by mouth 2 (two) times daily.    Historical Provider, MD  Multiple Vitamin (MULTIVITAMIN WITH MINERALS) TABS tablet Take 1 tablet by mouth daily.    Historical Provider, MD  oxycodone (ROXICODONE) 30 MG immediate release tablet Take 30 mg by mouth every 4 (four) hours as needed for pain.    Historical Provider, MD  potassium bicarbonate (K-LYTE) 25 MEQ disintegrating tablet Take 25 mEq by mouth daily.     Historical Provider, MD  thiamine 100 MG tablet Take 1 tablet (100 mg total) by mouth daily. 10/29/12   Nishant Dhungel, MD    Scheduled Meds: . antiseptic oral rinse  15 mL Mouth Rinse QID  . chlorhexidine  15 mL Mouth Rinse BID  . [START ON 09/13/2013] enoxaparin (LOVENOX) injection  30 mg Subcutaneous Q24H  . folic acid  1 mg Oral Daily  . levETIRAcetam  1,000 mg Intravenous Q12H  . LORazepam  0-4 mg Intravenous Q6H   Followed by  . [START ON 09/14/2013] LORazepam  0-4 mg Intravenous Q12H  . multivitamin with minerals  1 tablet Oral Daily  . pantoprazole (PROTONIX) IV  40 mg Intravenous Q24H  . sodium chloride  10-40 mL Intracatheter Q12H  . thiamine  100 mg Oral Daily     Or  . thiamine  100 mg Intravenous Daily   Continuous Infusions: . dextrose 5 % and 0.45% NaCl 50 mL/hr at 09/12/13 1951  . propofol 50 mcg/kg/min (09/12/13 1800)   PRN Meds:.ipratropium-albuterol, LORazepam, LORazepam, ondansetron (ZOFRAN) IV, ondansetron, sodium chloride   Blood pressure 122/67, pulse 92, temperature 102.1 F (38.9 C), temperature source Axillary, resp. rate 10, height 5' (1.524 m), weight 40.82 kg (89 lb 15.9 oz), SpO2 100.00%.   Results for orders placed during the hospital encounter of 09/11/13 (from the past 48 hour(s))  CBG MONITORING, ED     Status: None   Collection Time    09/11/13  9:04 PM      Result Value Ref Range  Glucose-Capillary 79  70 - 99 mg/dL  CBC WITH DIFFERENTIAL     Status: Abnormal   Collection Time    09/11/13  9:22 PM      Result Value Ref Range   WBC 7.5  4.0 - 10.5 K/uL   Comment: RESULT REPEATED AND VERIFIED   RBC 3.53 (*) 3.87 - 5.11 MIL/uL   Hemoglobin 10.8 (*) 12.0 - 15.0 g/dL   HCT 31.5 (*) 36.0 - 46.0 %   MCV 89.2  78.0 - 100.0 fL   MCH 30.6  26.0 - 34.0 pg   MCHC 34.3  30.0 - 36.0 g/dL   RDW 12.4  11.5 - 15.5 %   Platelets 213  150 - 400 K/uL   Neutrophils Relative % 66  43 - 77 %   Lymphocytes Relative 27  12 - 46 %   Monocytes Relative 5  3 - 12 %   Eosinophils Relative 2  0 - 5 %   Basophils Relative 0  0 - 1 %   Neutro Abs 4.9  1.7 - 7.7 K/uL   Lymphs Abs 2.0  0.7 - 4.0 K/uL   Monocytes Absolute 0.4  0.1 - 1.0 K/uL   Eosinophils Absolute 0.2  0.0 - 0.7 K/uL   Basophils Absolute 0.0  0.0 - 0.1 K/uL   RBC Morphology STOMATOCYTES     WBC Morphology WHITE COUNT CONFIRMED ON SMEAR    COMPREHENSIVE METABOLIC PANEL     Status: Abnormal   Collection Time    09/11/13  9:22 PM      Result Value Ref Range   Sodium 138  137 - 147 mEq/L   Potassium 3.5 (*) 3.7 - 5.3 mEq/L   Chloride 104  96 - 112 mEq/L   CO2 20  19 - 32 mEq/L   Glucose, Bld 51 (*) 70 - 99 mg/dL   BUN 13  6 - 23 mg/dL   Creatinine, Ser 0.83  0.50  - 1.10 mg/dL   Calcium 9.1  8.4 - 10.5 mg/dL   Total Protein 6.8  6.0 - 8.3 g/dL   Albumin 3.6  3.5 - 5.2 g/dL   AST 26  0 - 37 U/L   ALT 14  0 - 35 U/L   Alkaline Phosphatase 75  39 - 117 U/L   Total Bilirubin 0.3  0.3 - 1.2 mg/dL   GFR calc non Af Amer 76 (*) >90 mL/min   GFR calc Af Amer 88 (*) >90 mL/min   Comment: (NOTE)     The eGFR has been calculated using the CKD EPI equation.     This calculation has not been validated in all clinical situations.     eGFR's persistently <90 mL/min signify possible Chronic Kidney     Disease.   Anion gap 14  5 - 15  TROPONIN I     Status: None   Collection Time    09/11/13  9:22 PM      Result Value Ref Range   Troponin I <0.30  <0.30 ng/mL   Comment:            Due to the release kinetics of cTnI,     a negative result within the first hours     of the onset of symptoms does not rule out     myocardial infarction with certainty.     If myocardial infarction is still suspected,     repeat the test at appropriate intervals.  PROTIME-INR  Status: None   Collection Time    09/11/13  9:22 PM      Result Value Ref Range   Prothrombin Time 14.0  11.6 - 15.2 seconds   INR 1.08  0.00 - 1.49  ETHANOL     Status: None   Collection Time    09/11/13  9:22 PM      Result Value Ref Range   Alcohol, Ethyl (B) <11  0 - 11 mg/dL   Comment:            LOWEST DETECTABLE LIMIT FOR     SERUM ALCOHOL IS 11 mg/dL     FOR MEDICAL PURPOSES ONLY  SALICYLATE LEVEL     Status: Abnormal   Collection Time    09/11/13  9:22 PM      Result Value Ref Range   Salicylate Lvl <5.4 (*) 2.8 - 20.0 mg/dL  ACETAMINOPHEN LEVEL     Status: None   Collection Time    09/11/13  9:22 PM      Result Value Ref Range   Acetaminophen (Tylenol), Serum <15.0  10 - 30 ug/mL   Comment:            THERAPEUTIC CONCENTRATIONS VARY     SIGNIFICANTLY. A RANGE OF 10-30     ug/mL MAY BE AN EFFECTIVE     CONCENTRATION FOR MANY PATIENTS.     HOWEVER, SOME ARE BEST TREATED      AT CONCENTRATIONS OUTSIDE THIS     RANGE.     ACETAMINOPHEN CONCENTRATIONS     >150 ug/mL AT 4 HOURS AFTER     INGESTION AND >50 ug/mL AT 12     HOURS AFTER INGESTION ARE     OFTEN ASSOCIATED WITH TOXIC     REACTIONS.  AMMONIA     Status: None   Collection Time    09/11/13  9:23 PM      Result Value Ref Range   Ammonia 49  11 - 60 umol/L  LACTIC ACID, PLASMA     Status: Abnormal   Collection Time    09/11/13  9:23 PM      Result Value Ref Range   Lactic Acid, Venous 2.3 (*) 0.5 - 2.2 mmol/L  CBG MONITORING, ED     Status: Abnormal   Collection Time    09/11/13  9:31 PM      Result Value Ref Range   Glucose-Capillary 37 (*) 70 - 99 mg/dL   Comment 1 Documented in Chart     Comment 2 Notify RN    CBG MONITORING, ED     Status: Abnormal   Collection Time    09/11/13 10:09 PM      Result Value Ref Range   Glucose-Capillary 110 (*) 70 - 99 mg/dL   Comment 1 Documented in Chart     Comment 2 Notify RN    URINALYSIS, ROUTINE W REFLEX MICROSCOPIC     Status: Abnormal   Collection Time    09/11/13 10:10 PM      Result Value Ref Range   Color, Urine YELLOW  YELLOW   APPearance CLEAR  CLEAR   Specific Gravity, Urine 1.015  1.005 - 1.030   pH 5.5  5.0 - 8.0   Glucose, UA 500 (*) NEGATIVE mg/dL   Hgb urine dipstick TRACE (*) NEGATIVE   Bilirubin Urine NEGATIVE  NEGATIVE   Ketones, ur NEGATIVE  NEGATIVE mg/dL   Protein, ur NEGATIVE  NEGATIVE mg/dL   Urobilinogen, UA  0.2  0.0 - 1.0 mg/dL   Nitrite NEGATIVE  NEGATIVE   Leukocytes, UA NEGATIVE  NEGATIVE  URINE RAPID DRUG SCREEN (HOSP PERFORMED)     Status: Abnormal   Collection Time    09/11/13 10:10 PM      Result Value Ref Range   Opiates NONE DETECTED  NONE DETECTED   Cocaine NONE DETECTED  NONE DETECTED   Benzodiazepines POSITIVE (*) NONE DETECTED   Amphetamines NONE DETECTED  NONE DETECTED   Tetrahydrocannabinol NONE DETECTED  NONE DETECTED   Barbiturates NONE DETECTED  NONE DETECTED   Comment:            DRUG SCREEN  FOR MEDICAL PURPOSES     ONLY.  IF CONFIRMATION IS NEEDED     FOR ANY PURPOSE, NOTIFY LAB     WITHIN 5 DAYS.                LOWEST DETECTABLE LIMITS     FOR URINE DRUG SCREEN     Drug Class       Cutoff (ng/mL)     Amphetamine      1000     Barbiturate      200     Benzodiazepine   563     Tricyclics       149     Opiates          300     Cocaine          300     THC              50  URINE MICROSCOPIC-ADD ON     Status: Abnormal   Collection Time    09/11/13 10:10 PM      Result Value Ref Range   RBC / HPF 0-2  <3 RBC/hpf   Bacteria, UA MANY (*) RARE  CBG MONITORING, ED     Status: None   Collection Time    09/11/13 10:48 PM      Result Value Ref Range   Glucose-Capillary 79  70 - 99 mg/dL  BLOOD GAS, ARTERIAL     Status: Abnormal   Collection Time    09/11/13 10:55 PM      Result Value Ref Range   FIO2 0.45     Delivery systems VENTILATOR     Mode PRESSURE REGULATED VOLUME CONTROL     VT 500     Rate 16     Peep/cpap 5.0     pH, Arterial 7.376  7.350 - 7.450   pCO2 arterial 32.6 (*) 35.0 - 45.0 mmHg   pO2, Arterial 206.0 (*) 80.0 - 100.0 mmHg   Bicarbonate 18.7 (*) 20.0 - 24.0 mEq/L   TCO2 17.3  0 - 100 mmol/L   Acid-base deficit 5.6 (*) 0.0 - 2.0 mmol/L   O2 Saturation 99.2     Patient temperature 37.0     Collection site RIGHT RADIAL     Drawn by [phone]     Sample type ARTERIAL DRAW     Allens test (pass/fail) PASS  PASS  CBG MONITORING, ED     Status: Abnormal   Collection Time    09/11/13 11:14 PM      Result Value Ref Range   Glucose-Capillary 59 (*) 70 - 99 mg/dL   Comment 1 Documented in Chart     Comment 2 Notify RN    CBG MONITORING, ED     Status: Abnormal   Collection Time  09/11/13 11:48 PM      Result Value Ref Range   Glucose-Capillary 203 (*) 70 - 99 mg/dL   Comment 1 Notify RN    MRSA PCR SCREENING     Status: None   Collection Time    09/12/13 12:15 AM      Result Value Ref Range   MRSA by PCR NEGATIVE  NEGATIVE   Comment:             The GeneXpert MRSA Assay (FDA     approved for NASAL specimens     only), is one component of a     comprehensive MRSA colonization     surveillance program. It is not     intended to diagnose MRSA     infection nor to guide or     monitor treatment for     MRSA infections.  GLUCOSE, CAPILLARY     Status: Abnormal   Collection Time    09/12/13 12:21 AM      Result Value Ref Range   Glucose-Capillary 125 (*) 70 - 99 mg/dL  TROPONIN I     Status: None   Collection Time    09/12/13 12:44 AM      Result Value Ref Range   Troponin I <0.30  <0.30 ng/mL   Comment:            Due to the release kinetics of cTnI,     a negative result within the first hours     of the onset of symptoms does not rule out     myocardial infarction with certainty.     If myocardial infarction is still suspected,     repeat the test at appropriate intervals.  GLUCOSE, CAPILLARY     Status: Abnormal   Collection Time    09/12/13 12:54 AM      Result Value Ref Range   Glucose-Capillary 146 (*) 70 - 99 mg/dL  GLUCOSE, CAPILLARY     Status: Abnormal   Collection Time    09/12/13  1:35 AM      Result Value Ref Range   Glucose-Capillary 156 (*) 70 - 99 mg/dL  GLUCOSE, CAPILLARY     Status: Abnormal   Collection Time    09/12/13  1:52 AM      Result Value Ref Range   Glucose-Capillary 175 (*) 70 - 99 mg/dL   Comment 1 Documented in Chart     Comment 2 Notify RN    GLUCOSE, CAPILLARY     Status: Abnormal   Collection Time    09/12/13  2:32 AM      Result Value Ref Range   Glucose-Capillary 190 (*) 70 - 99 mg/dL   Comment 1 Notify RN    GLUCOSE, CAPILLARY     Status: Abnormal   Collection Time    09/12/13  3:04 AM      Result Value Ref Range   Glucose-Capillary 214 (*) 70 - 99 mg/dL  GLUCOSE, CAPILLARY     Status: Abnormal   Collection Time    09/12/13  4:10 AM      Result Value Ref Range   Glucose-Capillary 208 (*) 70 - 99 mg/dL  COMPREHENSIVE METABOLIC PANEL     Status: Abnormal   Collection  Time    09/12/13  4:46 AM      Result Value Ref Range   Sodium 134 (*) 137 - 147 mEq/L   Potassium 3.7  3.7 - 5.3 mEq/L   Chloride  101  96 - 112 mEq/L   CO2 19  19 - 32 mEq/L   Glucose, Bld 219 (*) 70 - 99 mg/dL   BUN 11  6 - 23 mg/dL   Creatinine, Ser 0.73  0.50 - 1.10 mg/dL   Calcium 8.9  8.4 - 10.5 mg/dL   Total Protein 6.4  6.0 - 8.3 g/dL   Albumin 3.5  3.5 - 5.2 g/dL   AST 25  0 - 37 U/L   ALT 13  0 - 35 U/L   Alkaline Phosphatase 75  39 - 117 U/L   Total Bilirubin 0.6  0.3 - 1.2 mg/dL   GFR calc non Af Amer >90  >90 mL/min   GFR calc Af Amer >90  >90 mL/min   Comment: (NOTE)     The eGFR has been calculated using the CKD EPI equation.     This calculation has not been validated in all clinical situations.     eGFR's persistently <90 mL/min signify possible Chronic Kidney     Disease.   Anion gap 14  5 - 15  CBC     Status: Abnormal   Collection Time    09/12/13  4:46 AM      Result Value Ref Range   WBC 9.6  4.0 - 10.5 K/uL   RBC 3.76 (*) 3.87 - 5.11 MIL/uL   Hemoglobin 11.2 (*) 12.0 - 15.0 g/dL   HCT 33.2 (*) 36.0 - 46.0 %   MCV 88.3  78.0 - 100.0 fL   MCH 29.8  26.0 - 34.0 pg   MCHC 33.7  30.0 - 36.0 g/dL   RDW 12.4  11.5 - 15.5 %   Platelets 184  150 - 400 K/uL  PROTIME-INR     Status: None   Collection Time    09/12/13  4:46 AM      Result Value Ref Range   Prothrombin Time 14.4  11.6 - 15.2 seconds   INR 1.12  0.00 - 1.49  BLOOD GAS, ARTERIAL     Status: Abnormal   Collection Time    09/12/13  5:28 AM      Result Value Ref Range   FIO2 30.00     Delivery systems VENTILATOR     Mode PRESSURE REGULATED VOLUME CONTROL     VT 500     Rate 16     Peep/cpap 5.0     pH, Arterial 7.455 (*) 7.350 - 7.450   pCO2 arterial 26.4 (*) 35.0 - 45.0 mmHg   pO2, Arterial 80.0  80.0 - 100.0 mmHg   Bicarbonate 18.3 (*) 20.0 - 24.0 mEq/L   TCO2 16.7  0 - 100 mmol/L   Acid-base deficit 4.9 (*) 0.0 - 2.0 mmol/L   O2 Saturation 96.8     Patient temperature 37.0      Collection site RIGHT RADIAL     Drawn by [id number]     Sample type ARTERIAL     Allens test (pass/fail) PASS  PASS  TROPONIN I     Status: None   Collection Time    09/12/13  7:03 AM      Result Value Ref Range   Troponin I <0.30  <0.30 ng/mL   Comment:            Due to the release kinetics of cTnI,     a negative result within the first hours     of the onset of symptoms does not rule out  myocardial infarction with certainty.     If myocardial infarction is still suspected,     repeat the test at appropriate intervals.  GLUCOSE, CAPILLARY     Status: Abnormal   Collection Time    09/12/13  8:32 AM      Result Value Ref Range   Glucose-Capillary 177 (*) 70 - 99 mg/dL   Comment 1 Documented in Chart     Comment 2 Notify RN    GLUCOSE, CAPILLARY     Status: Abnormal   Collection Time    09/12/13 11:04 AM      Result Value Ref Range   Glucose-Capillary 166 (*) 70 - 99 mg/dL  GLUCOSE, CAPILLARY     Status: Abnormal   Collection Time    09/12/13 12:43 PM      Result Value Ref Range   Glucose-Capillary 175 (*) 70 - 99 mg/dL   Comment 1 Documented in Chart     Comment 2 Notify RN    TROPONIN I     Status: None   Collection Time    09/12/13 12:54 PM      Result Value Ref Range   Troponin I <0.30  <0.30 ng/mL   Comment:            Due to the release kinetics of cTnI,     a negative result within the first hours     of the onset of symptoms does not rule out     myocardial infarction with certainty.     If myocardial infarction is still suspected,     repeat the test at appropriate intervals.  GLUCOSE, CAPILLARY     Status: Abnormal   Collection Time    09/12/13  2:24 PM      Result Value Ref Range   Glucose-Capillary 196 (*) 70 - 99 mg/dL   Comment 1 Documented in Chart     Comment 2 Notify RN    GLUCOSE, CAPILLARY     Status: Abnormal   Collection Time    09/12/13  4:45 PM      Result Value Ref Range   Glucose-Capillary 232 (*) 70 - 99 mg/dL   Comment 1 Documented  in Chart     Comment 2 Notify RN      Abd 1 View (kub)  09/12/2013   CLINICAL DATA:  NG tube placement.  Patient found unresponsive.  EXAM: ABDOMEN - 1 VIEW  COMPARISON:  07/20/2013  FINDINGS: Marked gaseous distention of the stomach. Enteric tube tip is in the upper mid abdomen, likely in the upper stomach but the proximal side hole is at the EG junction, likely in the distal esophagus. Advancement is suggested. Calcifications in the upper abdomen appear to represent pancreatic calcification and probably suggests chronic pancreatitis.  IMPRESSION: Enteric tube tip appears to be in the upper stomach with proximal side hole likely in the distal esophagus. Advancement is suggested.   Electronically Signed   By: Lucienne Capers M.D.   On: 09/12/2013 01:23   Ct Head Wo Contrast  09/11/2013   CLINICAL DATA:  Altered mental status.  The patient is unresponsive.  EXAM: CT HEAD WITHOUT CONTRAST  TECHNIQUE: Contiguous axial images were obtained from the base of the skull through the vertex without intravenous contrast.  COMPARISON:  07/08/2011  FINDINGS: No mass lesion. No midline shift. No acute hemorrhage or hematoma. No extra-axial fluid collections. No evidence of acute infarction. There is mild diffuse atrophy with no ventricular dilatation. Brain parenchyma is  otherwise normal. Osseous structures are normal.  IMPRESSION: No significant abnormality.  Slight diffuse atrophy, unchanged.   Electronically Signed   By: Rozetta Nunnery M.D.   On: 09/11/2013 22:23   Dg Chest Portable 1 View  09/11/2013   CLINICAL DATA:  Post intubation.  EXAM: PORTABLE CHEST - 1 VIEW  COMPARISON:  04/28/2013  FINDINGS: Endotracheal tube is been placed with tip measuring 3.1 cm above the carina. Enteric tube was placed with tip over the EG junction, likely in the distal esophagus. Proximal side hole is in the mid esophageal region. Normal heart size and pulmonary vascularity. No focal airspace disease or consolidation in the lungs. No  pneumothorax. No blunting of costophrenic angles.  IMPRESSION: Endotracheal tube tip measures 3.1 cm above the carinal. Enteric tube tip appears to be in the distal esophagus.   Electronically Signed   By: Lucienne Capers M.D.   On: 09/11/2013 22:11        Belinda Bringhurst A. Merlene Laughter, M.D.  Diplomate, [employer] adviser of Psychiatry and Neurology ( Neurology). 09/12/2013, 8:08 PM

## 2013-09-13 ENCOUNTER — Inpatient Hospital Stay (HOSPITAL_COMMUNITY): Payer: Medicaid Other

## 2013-09-13 ENCOUNTER — Inpatient Hospital Stay (HOSPITAL_COMMUNITY)
Admit: 2013-09-13 | Discharge: 2013-09-13 | Disposition: A | Discharge status: Home / Self Care | Payer: Medicaid Other | Attending: Neurology | Admitting: Neurology

## 2013-09-13 LAB — BLOOD GAS, ARTERIAL
ACID-BASE DEFICIT: 2.7 mmol/L — AB (ref 0.0–2.0)
BICARBONATE: 19.9 meq/L — AB (ref 20.0–24.0)
Drawn by: 25788
FIO2: 30 %
O2 Saturation: 97 %
PCO2 ART: 28.6 mmHg — AB (ref 35.0–45.0)
PEEP: 5 cmH2O
RATE: 12 resp/min
TCO2: 18.2 mmol/L (ref 0–100)
VT: 500 mL
pH, Arterial: 7.456 — ABNORMAL HIGH (ref 7.350–7.450)
pO2, Arterial: 86.8 mmHg (ref 80.0–100.0)

## 2013-09-13 LAB — COMPREHENSIVE METABOLIC PANEL
ALBUMIN: 2.7 g/dL — AB (ref 3.5–5.2)
ALK PHOS: 59 U/L (ref 39–117)
ALT: 9 U/L (ref 0–35)
AST: 16 U/L (ref 0–37)
Anion gap: 14 (ref 5–15)
BUN: 8 mg/dL (ref 6–23)
CHLORIDE: 106 meq/L (ref 96–112)
CO2: 20 mEq/L (ref 19–32)
Calcium: 8.2 mg/dL — ABNORMAL LOW (ref 8.4–10.5)
Creatinine, Ser: 0.8 mg/dL (ref 0.50–1.10)
GFR calc Af Amer: >90 mL/min (ref >90)
GFR calc non Af Amer: 80 mL/min — ABNORMAL LOW (ref >90)
Glucose, Bld: 350 mg/dL — ABNORMAL HIGH (ref 70–99)
POTASSIUM: 3.8 meq/L (ref 3.7–5.3)
Sodium: 140 mEq/L (ref 137–147)
Total Bilirubin: 0.4 mg/dL (ref 0.3–1.2)
Total Protein: 5.6 g/dL — ABNORMAL LOW (ref 6.0–8.3)

## 2013-09-13 LAB — CBC
HEMATOCRIT: 28.7 % — AB (ref 36.0–46.0)
Hemoglobin: 9.8 g/dL — ABNORMAL LOW (ref 12.0–15.0)
MCH: 30 pg (ref 26.0–34.0)
MCHC: 34.1 g/dL (ref 30.0–36.0)
MCV: 87.8 fL (ref 78.0–100.0)
Platelets: 154 10*3/uL (ref 150–400)
RBC: 3.27 MIL/uL — ABNORMAL LOW (ref 3.87–5.11)
RDW: 12.4 % (ref 11.5–15.5)
WBC: 8.8 10*3/uL (ref 4.0–10.5)

## 2013-09-13 LAB — PROTIME-INR
INR: 1.32 (ref 0.00–1.49)
Prothrombin Time: 16.4 seconds — ABNORMAL HIGH (ref 11.6–15.2)

## 2013-09-13 LAB — GLUCOSE, CAPILLARY
GLUCOSE-CAPILLARY: 280 mg/dL — AB (ref 70–99)
GLUCOSE-CAPILLARY: 298 mg/dL — AB (ref 70–99)
GLUCOSE-CAPILLARY: 251 mg/dL — AB (ref 70–99)
Glucose-Capillary: 344 mg/dL — ABNORMAL HIGH (ref 70–99)
Glucose-Capillary: 349 mg/dL — ABNORMAL HIGH (ref 70–99)
Glucose-Capillary: 343 mg/dL — ABNORMAL HIGH (ref 70–99)

## 2013-09-13 MED ORDER — SODIUM CHLORIDE 0.9 % IV SOLN
INTRAVENOUS | Status: DC
  Administered 2013-09-13 – 2013-09-19 (×8): via INTRAVENOUS

## 2013-09-13 MED ORDER — HYDRALAZINE HCL 20 MG/ML IJ SOLN: 10.0000 mg | Freq: Four times a day (QID) | INTRAMUSCULAR | Status: DC | PRN

## 2013-09-13 MED ORDER — INSULIN ASPART 100 UNIT/ML ~~LOC~~ SOLN
0.0000 [IU] | SUBCUTANEOUS | Status: DC
Start: 2013-09-14 — End: 2013-09-17
  Administered 2013-09-14 (×2): 1 [IU] via SUBCUTANEOUS
  Administered 2013-09-14: 2 [IU] via SUBCUTANEOUS
  Administered 2013-09-15 (×2): 5 [IU] via SUBCUTANEOUS
  Administered 2013-09-16: 1 [IU] via SUBCUTANEOUS
  Administered 2013-09-16: 9 [IU] via SUBCUTANEOUS
  Administered 2013-09-16: 2 [IU] via SUBCUTANEOUS
  Administered 2013-09-16 – 2013-09-17 (×2): 3 [IU] via SUBCUTANEOUS
  Administered 2013-09-17: 1 [IU] via SUBCUTANEOUS
  Administered 2013-09-17: 2 [IU] via SUBCUTANEOUS

## 2013-09-13 MED ORDER — INSULIN GLARGINE 100 UNIT/ML ~~LOC~~ SOLN
12.0000 [IU] | Freq: Every day | SUBCUTANEOUS | Status: DC
  Filled 2013-09-13: qty 0.12

## 2013-09-13 MED ORDER — PIPERACILLIN-TAZOBACTAM 3.375 G IVPB
3.3750 g | Freq: Three times a day (TID) | INTRAVENOUS | Status: DC
  Administered 2013-09-13 – 2013-09-15 (×5): 3.375 g via INTRAVENOUS
  Filled 2013-09-13 (×7): qty 50

## 2013-09-13 MED ORDER — INSULIN ASPART 100 UNIT/ML ~~LOC~~ SOLN
0.0000 [IU] | Freq: Three times a day (TID) | SUBCUTANEOUS | Status: DC
Start: 2013-09-13 — End: 2013-09-13
  Administered 2013-09-13: 3 [IU] via SUBCUTANEOUS

## 2013-09-13 NOTE — Progress Notes (Signed)
Patient ID: Erica [name], female   DOB: [birth year], 58 y.o.   MRN: [id number]   [place] A. Merlene Laughter, MD     [email or web address]          Erica [name] is an 58 y.o. female.   Assessment/Plan: Encephalopathy due to severe hyperglycemia. Unfortunately, there may be some irreversible brain damage that may have been done. She does seem to have eye deviation and posturing on examination although she has no posturing at this time. There is increased risk of a hypoglycemic induced seizures. We'll continue with the Keppra. EEG shows severe slowing but no epileptiform activity.   The nursing staff reports that the patient has been essentially unchanged. She still does not follow commands. No clinical seizures.  GENERAL: The patient is intubated. The examination While the patient is on propofol today.  HEENT: Supple. Atraumatic normocephalic.  ABDOMEN: soft  EXTREMITIES: No edema  BACK: Normal.  SKIN: Normal by inspection.  MENTAL STATUS: She lays in bed with eyes closed. Show opens her eyes only briefly and closes it. She does not follow commands. CRANIAL NERVES: Pupils are equal, round and reactive to light; extra ocular movements are full; coronary reflexes are intact. Cough and gag reflexes are intact. Facial muscle strength is symmetric.  MOTOR: She moves both sides well. No clear posturing is noted at this time.  COORDINATION: There is no dysmetria noted. No tremors.  REFLEXES: Deep tendon reflexes are symmetrical and normal.   SENSATION: She reports is painful some light bilaterally.    EEG shows 1. This recording is grossly abnormal showing severe generalized slowing indicating a generalized encephalopathy. There are no epileptiform activities observed.      Objective: Vital signs in last 24 hours: Temp:  [98.5 F (36.9 C)-101.7 F (38.7 C)] 99.4 F (37.4 C) (07/29 1600) Pulse Rate:  [82-108] 90 (07/29 1600) Resp:  [10-32] 12 (07/29 1600) BP:  (92-167)/(58-103) 140/82 mmHg (07/29 1600) SpO2:  [96 %-100 %] 100 % (07/29 1600) FiO2 (%):  [30 %] 30 % (07/29 1649)  Intake/Output from previous day: 07/28 0701 - 07/29 0700 In: 2485.6 [I.V.:2385.6; IV Piggyback:100] Out: 3550 [Urine:3550] Intake/Output this shift:   Nutritional status: NPO   Lab Results: Results for orders placed during the hospital encounter of 09/11/13 (from the past 48 hour(s))  CBG MONITORING, ED     Status: None   Collection Time    09/11/13  9:04 PM      Result Value Ref Range   Glucose-Capillary 79  70 - 99 mg/dL  CBC WITH DIFFERENTIAL     Status: Abnormal   Collection Time    09/11/13  9:22 PM      Result Value Ref Range   WBC 7.5  4.0 - 10.5 K/uL   Comment: RESULT REPEATED AND VERIFIED   RBC 3.53 (*) 3.87 - 5.11 MIL/uL   Hemoglobin 10.8 (*) 12.0 - 15.0 g/dL   HCT 31.5 (*) 36.0 - 46.0 %   MCV 89.2  78.0 - 100.0 fL   MCH 30.6  26.0 - 34.0 pg   MCHC 34.3  30.0 - 36.0 g/dL   RDW 12.4  11.5 - 15.5 %   Platelets 213  150 - 400 K/uL   Neutrophils Relative % 66  43 - 77 %   Lymphocytes Relative 27  12 - 46 %   Monocytes Relative 5  3 - 12 %   Eosinophils Relative 2  0 - 5 %   Basophils Relative 0  0 - 1 %   Neutro Abs 4.9  1.7 - 7.7 K/uL   Lymphs Abs 2.0  0.7 - 4.0 K/uL   Monocytes Absolute 0.4  0.1 - 1.0 K/uL   Eosinophils Absolute 0.2  0.0 - 0.7 K/uL   Basophils Absolute 0.0  0.0 - 0.1 K/uL   RBC Morphology STOMATOCYTES     WBC Morphology WHITE COUNT CONFIRMED ON SMEAR    COMPREHENSIVE METABOLIC PANEL     Status: Abnormal   Collection Time    09/11/13  9:22 PM      Result Value Ref Range   Sodium 138  137 - 147 mEq/L   Potassium 3.5 (*) 3.7 - 5.3 mEq/L   Chloride 104  96 - 112 mEq/L   CO2 20  19 - 32 mEq/L   Glucose, Bld 51 (*) 70 - 99 mg/dL   BUN 13  6 - 23 mg/dL   Creatinine, Ser 0.83  0.50 - 1.10 mg/dL   Calcium 9.1  8.4 - 10.5 mg/dL   Total Protein 6.8  6.0 - 8.3 g/dL   Albumin 3.6  3.5 - 5.2 g/dL   AST 26  0 - 37 U/L   ALT 14  0  - 35 U/L   Alkaline Phosphatase 75  39 - 117 U/L   Total Bilirubin 0.3  0.3 - 1.2 mg/dL   GFR calc non Af Amer 76 (*) >90 mL/min   GFR calc Af Amer 88 (*) >90 mL/min   Comment: (NOTE)     The eGFR has been calculated using the CKD EPI equation.     This calculation has not been validated in all clinical situations.     eGFR's persistently <90 mL/min signify possible Chronic Kidney     Disease.   Anion gap 14  5 - 15  TROPONIN I     Status: None   Collection Time    09/11/13  9:22 PM      Result Value Ref Range   Troponin I <0.30  <0.30 ng/mL   Comment:            Due to the release kinetics of cTnI,     a negative result within the first hours     of the onset of symptoms does not rule out     myocardial infarction with certainty.     If myocardial infarction is still suspected,     repeat the test at appropriate intervals.  PROTIME-INR     Status: None   Collection Time    09/11/13  9:22 PM      Result Value Ref Range   Prothrombin Time 14.0  11.6 - 15.2 seconds   INR 1.08  0.00 - 1.49  ETHANOL     Status: None   Collection Time    09/11/13  9:22 PM      Result Value Ref Range   Alcohol, Ethyl (B) <11  0 - 11 mg/dL   Comment:            LOWEST DETECTABLE LIMIT FOR     SERUM ALCOHOL IS 11 mg/dL     FOR MEDICAL PURPOSES ONLY  SALICYLATE LEVEL     Status: Abnormal   Collection Time    09/11/13  9:22 PM      Result Value Ref Range   Salicylate Lvl <7.7 (*) 2.8 - 20.0 mg/dL  ACETAMINOPHEN LEVEL     Status: None   Collection Time    09/11/13  9:22 PM      Result Value Ref Range   Acetaminophen (Tylenol), Serum <15.0  10 - 30 ug/mL   Comment:            THERAPEUTIC CONCENTRATIONS VARY     SIGNIFICANTLY. A RANGE OF 10-30     ug/mL MAY BE AN EFFECTIVE     CONCENTRATION FOR MANY PATIENTS.     HOWEVER, SOME ARE BEST TREATED     AT CONCENTRATIONS OUTSIDE THIS     RANGE.     ACETAMINOPHEN CONCENTRATIONS     >150 ug/mL AT 4 HOURS AFTER     INGESTION AND >50 ug/mL AT 12      HOURS AFTER INGESTION ARE     OFTEN ASSOCIATED WITH TOXIC     REACTIONS.  AMMONIA     Status: None   Collection Time    09/11/13  9:23 PM      Result Value Ref Range   Ammonia 49  11 - 60 umol/L  LACTIC ACID, PLASMA     Status: Abnormal   Collection Time    09/11/13  9:23 PM      Result Value Ref Range   Lactic Acid, Venous 2.3 (*) 0.5 - 2.2 mmol/L  CBG MONITORING, ED     Status: Abnormal   Collection Time    09/11/13  9:31 PM      Result Value Ref Range   Glucose-Capillary 37 (*) 70 - 99 mg/dL   Comment 1 Documented in Chart     Comment 2 Notify RN    CBG MONITORING, ED     Status: Abnormal   Collection Time    09/11/13 10:09 PM      Result Value Ref Range   Glucose-Capillary 110 (*) 70 - 99 mg/dL   Comment 1 Documented in Chart     Comment 2 Notify RN    URINALYSIS, ROUTINE W REFLEX MICROSCOPIC     Status: Abnormal   Collection Time    09/11/13 10:10 PM      Result Value Ref Range   Color, Urine YELLOW  YELLOW   APPearance CLEAR  CLEAR   Specific Gravity, Urine 1.015  1.005 - 1.030   pH 5.5  5.0 - 8.0   Glucose, UA 500 (*) NEGATIVE mg/dL   Hgb urine dipstick TRACE (*) NEGATIVE   Bilirubin Urine NEGATIVE  NEGATIVE   Ketones, ur NEGATIVE  NEGATIVE mg/dL   Protein, ur NEGATIVE  NEGATIVE mg/dL   Urobilinogen, UA 0.2  0.0 - 1.0 mg/dL   Nitrite NEGATIVE  NEGATIVE   Leukocytes, UA NEGATIVE  NEGATIVE  URINE RAPID DRUG SCREEN (HOSP PERFORMED)     Status: Abnormal   Collection Time    09/11/13 10:10 PM      Result Value Ref Range   Opiates NONE DETECTED  NONE DETECTED   Cocaine NONE DETECTED  NONE DETECTED   Benzodiazepines POSITIVE (*) NONE DETECTED   Amphetamines NONE DETECTED  NONE DETECTED   Tetrahydrocannabinol NONE DETECTED  NONE DETECTED   Barbiturates NONE DETECTED  NONE DETECTED   Comment:            DRUG SCREEN FOR MEDICAL PURPOSES     ONLY.  IF CONFIRMATION IS NEEDED     FOR ANY PURPOSE, NOTIFY LAB     WITHIN 5 DAYS.                LOWEST DETECTABLE  LIMITS     FOR URINE DRUG SCREEN  Drug Class       Cutoff (ng/mL)     Amphetamine      1000     Barbiturate      200     Benzodiazepine   875     Tricyclics       643     Opiates          300     Cocaine          300     THC              50  URINE MICROSCOPIC-ADD ON     Status: Abnormal   Collection Time    09/11/13 10:10 PM      Result Value Ref Range   RBC / HPF 0-2  <3 RBC/hpf   Bacteria, UA MANY (*) RARE  CBG MONITORING, ED     Status: None   Collection Time    09/11/13 10:48 PM      Result Value Ref Range   Glucose-Capillary 79  70 - 99 mg/dL  BLOOD GAS, ARTERIAL     Status: Abnormal   Collection Time    09/11/13 10:55 PM      Result Value Ref Range   FIO2 0.45     Delivery systems VENTILATOR     Mode PRESSURE REGULATED VOLUME CONTROL     VT 500     Rate 16     Peep/cpap 5.0     pH, Arterial 7.376  7.350 - 7.450   pCO2 arterial 32.6 (*) 35.0 - 45.0 mmHg   pO2, Arterial 206.0 (*) 80.0 - 100.0 mmHg   Bicarbonate 18.7 (*) 20.0 - 24.0 mEq/L   TCO2 17.3  0 - 100 mmol/L   Acid-base deficit 5.6 (*) 0.0 - 2.0 mmol/L   O2 Saturation 99.2     Patient temperature 37.0     Collection site RIGHT RADIAL     Drawn by [phone]     Sample type ARTERIAL DRAW     Allens test (pass/fail) PASS  PASS  CBG MONITORING, ED     Status: Abnormal   Collection Time    09/11/13 11:14 PM      Result Value Ref Range   Glucose-Capillary 59 (*) 70 - 99 mg/dL   Comment 1 Documented in Chart     Comment 2 Notify RN    CBG MONITORING, ED     Status: Abnormal   Collection Time    09/11/13 11:48 PM      Result Value Ref Range   Glucose-Capillary 203 (*) 70 - 99 mg/dL   Comment 1 Notify RN    MRSA PCR SCREENING     Status: None   Collection Time    09/12/13 12:15 AM      Result Value Ref Range   MRSA by PCR NEGATIVE  NEGATIVE   Comment:            The GeneXpert MRSA Assay (FDA     approved for NASAL specimens     only), is one component of a     comprehensive MRSA colonization      surveillance program. It is not     intended to diagnose MRSA     infection nor to guide or     monitor treatment for     MRSA infections.  GLUCOSE, CAPILLARY     Status: Abnormal   Collection Time    09/12/13 12:21 AM  Result Value Ref Range   Glucose-Capillary 125 (*) 70 - 99 mg/dL  TROPONIN I     Status: None   Collection Time    09/12/13 12:44 AM      Result Value Ref Range   Troponin I <0.30  <0.30 ng/mL   Comment:            Due to the release kinetics of cTnI,     a negative result within the first hours     of the onset of symptoms does not rule out     myocardial infarction with certainty.     If myocardial infarction is still suspected,     repeat the test at appropriate intervals.  GLUCOSE, CAPILLARY     Status: Abnormal   Collection Time    09/12/13 12:54 AM      Result Value Ref Range   Glucose-Capillary 146 (*) 70 - 99 mg/dL  GLUCOSE, CAPILLARY     Status: Abnormal   Collection Time    09/12/13  1:35 AM      Result Value Ref Range   Glucose-Capillary 156 (*) 70 - 99 mg/dL  GLUCOSE, CAPILLARY     Status: Abnormal   Collection Time    09/12/13  1:52 AM      Result Value Ref Range   Glucose-Capillary 175 (*) 70 - 99 mg/dL   Comment 1 Documented in Chart     Comment 2 Notify RN    GLUCOSE, CAPILLARY     Status: Abnormal   Collection Time    09/12/13  2:32 AM      Result Value Ref Range   Glucose-Capillary 190 (*) 70 - 99 mg/dL   Comment 1 Notify RN    GLUCOSE, CAPILLARY     Status: Abnormal   Collection Time    09/12/13  3:04 AM      Result Value Ref Range   Glucose-Capillary 214 (*) 70 - 99 mg/dL  GLUCOSE, CAPILLARY     Status: Abnormal   Collection Time    09/12/13  4:10 AM      Result Value Ref Range   Glucose-Capillary 208 (*) 70 - 99 mg/dL  COMPREHENSIVE METABOLIC PANEL     Status: Abnormal   Collection Time    09/12/13  4:46 AM      Result Value Ref Range   Sodium 134 (*) 137 - 147 mEq/L   Potassium 3.7  3.7 - 5.3 mEq/L   Chloride 101   96 - 112 mEq/L   CO2 19  19 - 32 mEq/L   Glucose, Bld 219 (*) 70 - 99 mg/dL   BUN 11  6 - 23 mg/dL   Creatinine, Ser 0.73  0.50 - 1.10 mg/dL   Calcium 8.9  8.4 - 10.5 mg/dL   Total Protein 6.4  6.0 - 8.3 g/dL   Albumin 3.5  3.5 - 5.2 g/dL   AST 25  0 - 37 U/L   ALT 13  0 - 35 U/L   Alkaline Phosphatase 75  39 - 117 U/L   Total Bilirubin 0.6  0.3 - 1.2 mg/dL   GFR calc non Af Amer >90  >90 mL/min   GFR calc Af Amer >90  >90 mL/min   Comment: (NOTE)     The eGFR has been calculated using the CKD EPI equation.     This calculation has not been validated in all clinical situations.     eGFR's persistently <90 mL/min signify possible Chronic  Kidney     Disease.   Anion gap 14  5 - 15  CBC     Status: Abnormal   Collection Time    09/12/13  4:46 AM      Result Value Ref Range   WBC 9.6  4.0 - 10.5 K/uL   RBC 3.76 (*) 3.87 - 5.11 MIL/uL   Hemoglobin 11.2 (*) 12.0 - 15.0 g/dL   HCT 33.2 (*) 36.0 - 46.0 %   MCV 88.3  78.0 - 100.0 fL   MCH 29.8  26.0 - 34.0 pg   MCHC 33.7  30.0 - 36.0 g/dL   RDW 12.4  11.5 - 15.5 %   Platelets 184  150 - 400 K/uL  PROTIME-INR     Status: None   Collection Time    09/12/13  4:46 AM      Result Value Ref Range   Prothrombin Time 14.4  11.6 - 15.2 seconds   INR 1.12  0.00 - 1.49  BLOOD GAS, ARTERIAL     Status: Abnormal   Collection Time    09/12/13  5:28 AM      Result Value Ref Range   FIO2 30.00     Delivery systems VENTILATOR     Mode PRESSURE REGULATED VOLUME CONTROL     VT 500     Rate 16     Peep/cpap 5.0     pH, Arterial 7.455 (*) 7.350 - 7.450   pCO2 arterial 26.4 (*) 35.0 - 45.0 mmHg   pO2, Arterial 80.0  80.0 - 100.0 mmHg   Bicarbonate 18.3 (*) 20.0 - 24.0 mEq/L   TCO2 16.7  0 - 100 mmol/L   Acid-base deficit 4.9 (*) 0.0 - 2.0 mmol/L   O2 Saturation 96.8     Patient temperature 37.0     Collection site RIGHT RADIAL     Drawn by [id number]     Sample type ARTERIAL     Allens test (pass/fail) PASS  PASS  TROPONIN I     Status: None    Collection Time    09/12/13  7:03 AM      Result Value Ref Range   Troponin I <0.30  <0.30 ng/mL   Comment:            Due to the release kinetics of cTnI,     a negative result within the first hours     of the onset of symptoms does not rule out     myocardial infarction with certainty.     If myocardial infarction is still suspected,     repeat the test at appropriate intervals.  GLUCOSE, CAPILLARY     Status: Abnormal   Collection Time    09/12/13  8:32 AM      Result Value Ref Range   Glucose-Capillary 177 (*) 70 - 99 mg/dL   Comment 1 Documented in Chart     Comment 2 Notify RN    GLUCOSE, CAPILLARY     Status: Abnormal   Collection Time    09/12/13 11:04 AM      Result Value Ref Range   Glucose-Capillary 166 (*) 70 - 99 mg/dL  GLUCOSE, CAPILLARY     Status: Abnormal   Collection Time    09/12/13 12:43 PM      Result Value Ref Range   Glucose-Capillary 175 (*) 70 - 99 mg/dL   Comment 1 Documented in Chart     Comment 2 Notify RN    TROPONIN  I     Status: None   Collection Time    09/12/13 12:54 PM      Result Value Ref Range   Troponin I <0.30  <0.30 ng/mL   Comment:            Due to the release kinetics of cTnI,     a negative result within the first hours     of the onset of symptoms does not rule out     myocardial infarction with certainty.     If myocardial infarction is still suspected,     repeat the test at appropriate intervals.  CULTURE, BLOOD (ROUTINE X 2)     Status: None   Collection Time    09/12/13 12:54 PM      Result Value Ref Range   Specimen Description BLOOD PICC LINE DRAWN BY RN LS     Special Requests       Value: BOTTLES DRAWN AEROBIC AND ANAEROBIC AEB=8CC ANA=6CC   Culture NO GROWTH 1 DAY     Report Status PENDING    CULTURE, BLOOD (ROUTINE X 2)     Status: None   Collection Time    09/12/13  1:00 PM      Result Value Ref Range   Specimen Description BLOOD LEFT FOOT     Special Requests BOTTLES DRAWN AEROBIC ONLY 4CC      Culture NO GROWTH 1 DAY     Report Status PENDING    GLUCOSE, CAPILLARY     Status: Abnormal   Collection Time    09/12/13  2:24 PM      Result Value Ref Range   Glucose-Capillary 196 (*) 70 - 99 mg/dL   Comment 1 Documented in Chart     Comment 2 Notify RN    GLUCOSE, CAPILLARY     Status: Abnormal   Collection Time    09/12/13  4:45 PM      Result Value Ref Range   Glucose-Capillary 232 (*) 70 - 99 mg/dL   Comment 1 Documented in Chart     Comment 2 Notify RN    GLUCOSE, CAPILLARY     Status: Abnormal   Collection Time    09/12/13  9:06 PM      Result Value Ref Range   Glucose-Capillary 273 (*) 70 - 99 mg/dL   Comment 1 Documented in Chart     Comment 2 Notify RN    GLUCOSE, CAPILLARY     Status: Abnormal   Collection Time    09/12/13 10:49 PM      Result Value Ref Range   Glucose-Capillary 293 (*) 70 - 99 mg/dL  GLUCOSE, CAPILLARY     Status: Abnormal   Collection Time    09/13/13  2:42 AM      Result Value Ref Range   Glucose-Capillary 344 (*) 70 - 99 mg/dL  COMPREHENSIVE METABOLIC PANEL     Status: Abnormal   Collection Time    09/13/13  4:54 AM      Result Value Ref Range   Sodium 140  137 - 147 mEq/L   Potassium 3.8  3.7 - 5.3 mEq/L   Chloride 106  96 - 112 mEq/L   CO2 20  19 - 32 mEq/L   Glucose, Bld 350 (*) 70 - 99 mg/dL   BUN 8  6 - 23 mg/dL   Creatinine, Ser 0.80  0.50 - 1.10 mg/dL   Calcium 8.2 (*) 8.4 - 10.5 mg/dL  Total Protein 5.6 (*) 6.0 - 8.3 g/dL   Albumin 2.7 (*) 3.5 - 5.2 g/dL   AST 16  0 - 37 U/L   ALT 9  0 - 35 U/L   Alkaline Phosphatase 59  39 - 117 U/L   Total Bilirubin 0.4  0.3 - 1.2 mg/dL   GFR calc non Af Amer 80 (*) >90 mL/min   GFR calc Af Amer >90  >90 mL/min   Comment: (NOTE)     The eGFR has been calculated using the CKD EPI equation.     This calculation has not been validated in all clinical situations.     eGFR's persistently <90 mL/min signify possible Chronic Kidney     Disease.   Anion gap 14  5 - 15  CBC     Status:  Abnormal   Collection Time    09/13/13  4:54 AM      Result Value Ref Range   WBC 8.8  4.0 - 10.5 K/uL   RBC 3.27 (*) 3.87 - 5.11 MIL/uL   Hemoglobin 9.8 (*) 12.0 - 15.0 g/dL   HCT 28.7 (*) 36.0 - 46.0 %   MCV 87.8  78.0 - 100.0 fL   MCH 30.0  26.0 - 34.0 pg   MCHC 34.1  30.0 - 36.0 g/dL   RDW 12.4  11.5 - 15.5 %   Platelets 154  150 - 400 K/uL  PROTIME-INR     Status: Abnormal   Collection Time    09/13/13  4:54 AM      Result Value Ref Range   Prothrombin Time 16.4 (*) 11.6 - 15.2 seconds   INR 1.32  0.00 - 1.49  GLUCOSE, CAPILLARY     Status: Abnormal   Collection Time    09/13/13  5:04 AM      Result Value Ref Range   Glucose-Capillary 349 (*) 70 - 99 mg/dL  GLUCOSE, CAPILLARY     Status: Abnormal   Collection Time    09/13/13  6:13 AM      Result Value Ref Range   Glucose-Capillary 343 (*) 70 - 99 mg/dL   Comment 1 Documented in Chart     Comment 2 Notify RN    BLOOD GAS, ARTERIAL     Status: Abnormal   Collection Time    09/13/13  7:26 AM      Result Value Ref Range   FIO2 30.00     Delivery systems VENTILATOR     Mode PRESSURE REGULATED VOLUME CONTROL     VT 500.00     Rate 12     Peep/cpap 5.0     pH, Arterial 7.456 (*) 7.350 - 7.450   pCO2 arterial 28.6 (*) 35.0 - 45.0 mmHg   pO2, Arterial 86.8  80.0 - 100.0 mmHg   Bicarbonate 19.9 (*) 20.0 - 24.0 mEq/L   TCO2 18.2  0 - 100 mmol/L   Acid-base deficit 2.7 (*) 0.0 - 2.0 mmol/L   O2 Saturation 97.0     Collection site RIGHT RADIAL     Drawn by [id number]     Sample type ARTERIAL     Allens test (pass/fail) PASS  PASS  GLUCOSE, CAPILLARY     Status: Abnormal   Collection Time    09/13/13  7:46 AM      Result Value Ref Range   Glucose-Capillary 280 (*) 70 - 99 mg/dL   Comment 1 Documented in Chart     Comment 2 Notify RN  GLUCOSE, CAPILLARY     Status: Abnormal   Collection Time    09/13/13 11:58 AM      Result Value Ref Range   Glucose-Capillary 298 (*) 70 - 99 mg/dL   Comment 1 Documented in Chart       Comment 2 Notify RN    GLUCOSE, CAPILLARY     Status: Abnormal   Collection Time    09/13/13  4:44 PM      Result Value Ref Range   Glucose-Capillary 251 (*) 70 - 99 mg/dL   Comment 1 Documented in Chart     Comment 2 Notify RN      Lipid Panel No results found for this basename: CHOL, TRIG, HDL, CHOLHDL, VLDL, LDLCALC,  in the last 72 hours  Studies/Results: Abd 1 View (kub)  09/12/2013   CLINICAL DATA:  NG tube placement.  Patient found unresponsive.  EXAM: ABDOMEN - 1 VIEW  COMPARISON:  07/20/2013  FINDINGS: Marked gaseous distention of the stomach. Enteric tube tip is in the upper mid abdomen, likely in the upper stomach but the proximal side hole is at the EG junction, likely in the distal esophagus. Advancement is suggested. Calcifications in the upper abdomen appear to represent pancreatic calcification and probably suggests chronic pancreatitis.  IMPRESSION: Enteric tube tip appears to be in the upper stomach with proximal side hole likely in the distal esophagus. Advancement is suggested.   Electronically Signed   By: Lucienne Capers M.D.   On: 09/12/2013 01:23   Ct Head Wo Contrast  09/11/2013   CLINICAL DATA:  Altered mental status.  The patient is unresponsive.  EXAM: CT HEAD WITHOUT CONTRAST  TECHNIQUE: Contiguous axial images were obtained from the base of the skull through the vertex without intravenous contrast.  COMPARISON:  07/08/2011  FINDINGS: No mass lesion. No midline shift. No acute hemorrhage or hematoma. No extra-axial fluid collections. No evidence of acute infarction. There is mild diffuse atrophy with no ventricular dilatation. Brain parenchyma is otherwise normal. Osseous structures are normal.  IMPRESSION: No significant abnormality.  Slight diffuse atrophy, unchanged.   Electronically Signed   By: Rozetta Nunnery M.D.   On: 09/11/2013 22:23   Portable Chest Xray In Am  09/13/2013   CLINICAL DATA:  Ventilator.  EXAM: PORTABLE CHEST - 1 VIEW  COMPARISON:  09/11/2013   FINDINGS: Endotracheal tube has tip approximately 4.5 cm above the carina. Enteric tube has been advanced and courses into the region of the stomach and off the inferior portion of the film as tip is not visualized. There has been placement of a right-sided PICC line which has tip over the region of the cavoatrial junction.  Lungs are adequately inflated without consolidation or effusion. There is no pneumothorax. Cardiomediastinal silhouette and remainder of the exam is unchanged.  IMPRESSION: No acute cardiopulmonary disease.  Tubes and lines as described.   Electronically Signed   By: Marin Olp M.D.   On: 09/13/2013 07:51   Dg Chest Portable 1 View  09/11/2013   CLINICAL DATA:  Post intubation.  EXAM: PORTABLE CHEST - 1 VIEW  COMPARISON:  04/28/2013  FINDINGS: Endotracheal tube is been placed with tip measuring 3.1 cm above the carina. Enteric tube was placed with tip over the EG junction, likely in the distal esophagus. Proximal side hole is in the mid esophageal region. Normal heart size and pulmonary vascularity. No focal airspace disease or consolidation in the lungs. No pneumothorax. No blunting of costophrenic angles.  IMPRESSION: Endotracheal tube  tip measures 3.1 cm above the carinal. Enteric tube tip appears to be in the distal esophagus.   Electronically Signed   By: Lucienne Capers M.D.   On: 09/11/2013 22:11    Medications:  Scheduled Meds: . antiseptic oral rinse  15 mL Mouth Rinse QID  . chlorhexidine  15 mL Mouth Rinse BID  . enoxaparin (LOVENOX) injection  30 mg Subcutaneous Q24H  . folic acid  1 mg Oral Daily  . insulin aspart  0-9 Units Subcutaneous TID WC  . insulin glargine  12 Units Subcutaneous QHS  . levETIRAcetam  1,000 mg Intravenous Q12H  . LORazepam  0-4 mg Intravenous Q6H   Followed by  . [START ON 09/14/2013] LORazepam  0-4 mg Intravenous Q12H  . multivitamin with minerals  1 tablet Oral Daily  . pantoprazole (PROTONIX) IV  40 mg Intravenous Q24H  .  piperacillin-tazobactam (ZOSYN)  IV  3.375 g Intravenous Q8H  . sodium chloride  10-40 mL Intracatheter Q12H  . thiamine  100 mg Oral Daily   Or  . thiamine  100 mg Intravenous Daily   Continuous Infusions: . sodium chloride 75 mL/hr at 09/13/13 1738  . propofol 50 mcg/kg/min (09/13/13 1737)   PRN Meds:.acetaminophen, hydrALAZINE, ipratropium-albuterol, LORazepam, LORazepam, ondansetron (ZOFRAN) IV, ondansetron, sodium chloride     LOS: 2 days   Reola Buckles A. Merlene Laughter, M.D.  Diplomate, Tax adviser of Psychiatry and Neurology ( Neurology).

## 2013-09-13 NOTE — Clinical Documentation Improvement (Signed)
Possible Clinical conditions: Malutrition W/ BMI= 17.58 kg/(m^2).  Underweight w/BMI- 17.58 kg/(m^2).  Other condition Cannot clinically determine   Supporting Information: ( As per Nutritional Assessment) Sign & Symptoms: Diagnostics: DOCUMENTATION CODES  Per approved criteria   -Underweight   INTERVENTION:  - BMI: Body mass index is 17.58 kg/(m^2). Underweight  INTERVENTION:  -Recommend initiate nutrition support if unable to wean within 48 hours of intubation: Consider starting Vital 1.2 AF @ 20 ml/hr via OGT and increase 10 ml/hr every 12 hours to goal rate of 30 ml/hr  Regimen at goal rate will provide 864 kcals (1186 kcals with current propofol rate), 54 grams protein, and 584 ml fluid. This will meet 75% of estimated kcal needs (100% of needs with current propofol rate) and 100% of estimated protein needs.  Thank You, Alessandra Grout, RN, BSN, CCDS,Clinical Documentation Specialist:  [phone]  [phone]=[facility] [place]- [facility] Information Management

## 2013-09-13 NOTE — Progress Notes (Signed)
EEG Completed; Results Pending  

## 2013-09-13 NOTE — Care Management Note (Signed)
    Page 1 of 2   09/20/2013     3:09:41 PM CARE MANAGEMENT NOTE 09/20/2013  Patient:  Erica, [name]   Account Number:  [id number]  Date Initiated:  09/13/2013  Documentation initiated by:  Theophilus Kinds  Subjective/Objective Assessment:   Pt found down at home by family and brought to hospital. Pt currently on the ventilator.     Action/Plan:   Will continue to follow for discharge planning needs. No family present at this time.   Anticipated DC Date:  09/20/2013   Anticipated DC Plan:  [place] referral  Clinical Social Worker      DC Planning Services  CM consult      Choice offered to / List presented to:             Status of service:  Completed, signed off Medicare Important Message given?  YES (If response is "NO", the following Medicare IM given date fields will be blank) Date Medicare IM given:  09/18/2013 Medicare IM given by:  Vladimir Creeks Date Additional Medicare IM given:   Additional Medicare IM given by:    Discharge Disposition:  [place]  Per UR Regulation:  Reviewed for med. necessity/level of care/duration of stay  If discussed at [place] of Stay Meetings, dates discussed:    Comments:  09/20/13 [place], RN BSN CM Pt potential discharge today. Spoke with pt and pts daughter about [facility] services. They decline HH RN and pt cannot receive HH PT due to Medicaid. Pt has CAP aide in place. Pt has a rolling walker for home use. No other CM needs noted.  09/19/13 1400 Vladimir Creeks RN/CM Spoke with pt, son [name], and his girlfriend. They have decided to take the pt home. MD is awaiting A1C and will decide on insulin dosages when test available, and D/C planned for tomorrow, with [facility] RN. They choose [facility] 09/18/13 1700 Vladimir Creeks RN Daughter who is CAP aide had requested to be present during PT eval was not here. Therapist waited 30 min and saw another pt but daughter still not here, so pt was seen.  therapist recommending SNF, due to Medicaid will not cover PT at home, and pt would definitely benefit from PT services. Son, [name] in room and says he will speak with sister and family, but would like for CSW to try to find a bed at SNF for pt and pt agrees she would like to go for rehab if possible, to improve her stability and endurance. She cannot go for very long without getting tired and is afraid she will fall. Notified CSW 09/15/13 [place], RN BSN CM PT recommends SNF at discharge. CSW is aware. No furthur CM needs noted.  09/13/13 [place], RN BSN CM

## 2013-09-13 NOTE — Progress Notes (Signed)
Patient responding only to pain. Unable to follow commands, or track with eyes. Son and daughter were at bedside on and off during the day, and patient did not respond to them. Spastic posturing persists.

## 2013-09-13 NOTE — Progress Notes (Signed)
Subjective:  she failed weaning attempt yesterday. She has not had any definite seizures. She is sedated but has been agitated when her sedation is decreased. She's been febrile overnight.  Objective: Vital signs in last 24 hours: Temp:  [98.5 F (36.9 C)-102.1 F (38.9 C)] 99.4 F (37.4 C) (07/29 0615) Pulse Rate:  [82-99] 92 (07/29 0645) Resp:  [0-28] 13 (07/29 0645) BP: (92-156)/(58-99) 155/90 mmHg (07/29 0645) SpO2:  [96 %-100 %] 99 % (07/29 0645) FiO2 (%):  [30 %] 30 % (07/29 0304) Weight change:  Last BM Date: 09/12/13  Intake/Output from previous day: 07/28 0701 - 07/29 0700 In: 2298.3 [I.V.:2198.3; IV Piggyback:100] Out: 3550 [Urine:3550]  PHYSICAL EXAM General appearance: uncooperative Resp: rhonchi bilaterally Cardio: regular rate and rhythm, S1, S2 normal, no murmur, click, rub or gallop GI: soft, non-tender; bowel sounds normal; no masses,  no organomegaly Extremities: extremities normal, atraumatic, no cyanosis or edema  Lab Results:  Results for orders placed during the hospital encounter of 09/11/13 (from the past 48 hour(s))  CBG MONITORING, ED     Status: None   Collection Time    09/11/13  9:04 PM      Result Value Ref Range   Glucose-Capillary 79  70 - 99 mg/dL  CBC WITH DIFFERENTIAL     Status: Abnormal   Collection Time    09/11/13  9:22 PM      Result Value Ref Range   WBC 7.5  4.0 - 10.5 K/uL   Comment: RESULT REPEATED AND VERIFIED   RBC 3.53 (*) 3.87 - 5.11 MIL/uL   Hemoglobin 10.8 (*) 12.0 - 15.0 g/dL   HCT 31.5 (*) 36.0 - 46.0 %   MCV 89.2  78.0 - 100.0 fL   MCH 30.6  26.0 - 34.0 pg   MCHC 34.3  30.0 - 36.0 g/dL   RDW 12.4  11.5 - 15.5 %   Platelets 213  150 - 400 K/uL   Neutrophils Relative % 66  43 - 77 %   Lymphocytes Relative 27  12 - 46 %   Monocytes Relative 5  3 - 12 %   Eosinophils Relative 2  0 - 5 %   Basophils Relative 0  0 - 1 %   Neutro Abs 4.9  1.7 - 7.7 K/uL   Lymphs Abs 2.0  0.7 - 4.0 K/uL   Monocytes Absolute 0.4   0.1 - 1.0 K/uL   Eosinophils Absolute 0.2  0.0 - 0.7 K/uL   Basophils Absolute 0.0  0.0 - 0.1 K/uL   RBC Morphology STOMATOCYTES     WBC Morphology WHITE COUNT CONFIRMED ON SMEAR    COMPREHENSIVE METABOLIC PANEL     Status: Abnormal   Collection Time    09/11/13  9:22 PM      Result Value Ref Range   Sodium 138  137 - 147 mEq/L   Potassium 3.5 (*) 3.7 - 5.3 mEq/L   Chloride 104  96 - 112 mEq/L   CO2 20  19 - 32 mEq/L   Glucose, Bld 51 (*) 70 - 99 mg/dL   BUN 13  6 - 23 mg/dL   Creatinine, Ser 0.83  0.50 - 1.10 mg/dL   Calcium 9.1  8.4 - 10.5 mg/dL   Total Protein 6.8  6.0 - 8.3 g/dL   Albumin 3.6  3.5 - 5.2 g/dL   AST 26  0 - 37 U/L   ALT 14  0 - 35 U/L   Alkaline Phosphatase 75  39 - 117 U/L   Total Bilirubin 0.3  0.3 - 1.2 mg/dL   GFR calc non Af Amer 76 (*) >90 mL/min   GFR calc Af Amer 88 (*) >90 mL/min   Comment: (NOTE)     The eGFR has been calculated using the CKD EPI equation.     This calculation has not been validated in all clinical situations.     eGFR's persistently <90 mL/min signify possible Chronic Kidney     Disease.   Anion gap 14  5 - 15  TROPONIN I     Status: None   Collection Time    09/11/13  9:22 PM      Result Value Ref Range   Troponin I <0.30  <0.30 ng/mL   Comment:            Due to the release kinetics of cTnI,     a negative result within the first hours     of the onset of symptoms does not rule out     myocardial infarction with certainty.     If myocardial infarction is still suspected,     repeat the test at appropriate intervals.  PROTIME-INR     Status: None   Collection Time    09/11/13  9:22 PM      Result Value Ref Range   Prothrombin Time 14.0  11.6 - 15.2 seconds   INR 1.08  0.00 - 1.49  ETHANOL     Status: None   Collection Time    09/11/13  9:22 PM      Result Value Ref Range   Alcohol, Ethyl (B) <11  0 - 11 mg/dL   Comment:            LOWEST DETECTABLE LIMIT FOR     SERUM ALCOHOL IS 11 mg/dL     FOR MEDICAL PURPOSES  ONLY  SALICYLATE LEVEL     Status: Abnormal   Collection Time    09/11/13  9:22 PM      Result Value Ref Range   Salicylate Lvl <3.5 (*) 2.8 - 20.0 mg/dL  ACETAMINOPHEN LEVEL     Status: None   Collection Time    09/11/13  9:22 PM      Result Value Ref Range   Acetaminophen (Tylenol), Serum <15.0  10 - 30 ug/mL   Comment:            THERAPEUTIC CONCENTRATIONS VARY     SIGNIFICANTLY. A RANGE OF 10-30     ug/mL MAY BE AN EFFECTIVE     CONCENTRATION FOR MANY PATIENTS.     HOWEVER, SOME ARE BEST TREATED     AT CONCENTRATIONS OUTSIDE THIS     RANGE.     ACETAMINOPHEN CONCENTRATIONS     >150 ug/mL AT 4 HOURS AFTER     INGESTION AND >50 ug/mL AT 12     HOURS AFTER INGESTION ARE     OFTEN ASSOCIATED WITH TOXIC     REACTIONS.  AMMONIA     Status: None   Collection Time    09/11/13  9:23 PM      Result Value Ref Range   Ammonia 49  11 - 60 umol/L  LACTIC ACID, PLASMA     Status: Abnormal   Collection Time    09/11/13  9:23 PM      Result Value Ref Range   Lactic Acid, Venous 2.3 (*) 0.5 - 2.2 mmol/L  CBG MONITORING, ED  Status: Abnormal   Collection Time    09/11/13  9:31 PM      Result Value Ref Range   Glucose-Capillary 37 (*) 70 - 99 mg/dL   Comment 1 Documented in Chart     Comment 2 Notify RN    CBG MONITORING, ED     Status: Abnormal   Collection Time    09/11/13 10:09 PM      Result Value Ref Range   Glucose-Capillary 110 (*) 70 - 99 mg/dL   Comment 1 Documented in Chart     Comment 2 Notify RN    URINALYSIS, ROUTINE W REFLEX MICROSCOPIC     Status: Abnormal   Collection Time    09/11/13 10:10 PM      Result Value Ref Range   Color, Urine YELLOW  YELLOW   APPearance CLEAR  CLEAR   Specific Gravity, Urine 1.015  1.005 - 1.030   pH 5.5  5.0 - 8.0   Glucose, UA 500 (*) NEGATIVE mg/dL   Hgb urine dipstick TRACE (*) NEGATIVE   Bilirubin Urine NEGATIVE  NEGATIVE   Ketones, ur NEGATIVE  NEGATIVE mg/dL   Protein, ur NEGATIVE  NEGATIVE mg/dL   Urobilinogen, UA 0.2   0.0 - 1.0 mg/dL   Nitrite NEGATIVE  NEGATIVE   Leukocytes, UA NEGATIVE  NEGATIVE  URINE RAPID DRUG SCREEN (HOSP PERFORMED)     Status: Abnormal   Collection Time    09/11/13 10:10 PM      Result Value Ref Range   Opiates NONE DETECTED  NONE DETECTED   Cocaine NONE DETECTED  NONE DETECTED   Benzodiazepines POSITIVE (*) NONE DETECTED   Amphetamines NONE DETECTED  NONE DETECTED   Tetrahydrocannabinol NONE DETECTED  NONE DETECTED   Barbiturates NONE DETECTED  NONE DETECTED   Comment:            DRUG SCREEN FOR MEDICAL PURPOSES     ONLY.  IF CONFIRMATION IS NEEDED     FOR ANY PURPOSE, NOTIFY LAB     WITHIN 5 DAYS.                LOWEST DETECTABLE LIMITS     FOR URINE DRUG SCREEN     Drug Class       Cutoff (ng/mL)     Amphetamine      1000     Barbiturate      200     Benzodiazepine   161     Tricyclics       096     Opiates          300     Cocaine          300     THC              50  URINE MICROSCOPIC-ADD ON     Status: Abnormal   Collection Time    09/11/13 10:10 PM      Result Value Ref Range   RBC / HPF 0-2  <3 RBC/hpf   Bacteria, UA MANY (*) RARE  CBG MONITORING, ED     Status: None   Collection Time    09/11/13 10:48 PM      Result Value Ref Range   Glucose-Capillary 79  70 - 99 mg/dL  BLOOD GAS, ARTERIAL     Status: Abnormal   Collection Time    09/11/13 10:55 PM      Result Value Ref Range   FIO2 0.45  Delivery systems VENTILATOR     Mode PRESSURE REGULATED VOLUME CONTROL     VT 500     Rate 16     Peep/cpap 5.0     pH, Arterial 7.376  7.350 - 7.450   pCO2 arterial 32.6 (*) 35.0 - 45.0 mmHg   pO2, Arterial 206.0 (*) 80.0 - 100.0 mmHg   Bicarbonate 18.7 (*) 20.0 - 24.0 mEq/L   TCO2 17.3  0 - 100 mmol/L   Acid-base deficit 5.6 (*) 0.0 - 2.0 mmol/L   O2 Saturation 99.2     Patient temperature 37.0     Collection site RIGHT RADIAL     Drawn by [id number]     Sample type ARTERIAL DRAW     Allens test (pass/fail) PASS  PASS  CBG MONITORING, ED     Status:  Abnormal   Collection Time    09/11/13 11:14 PM      Result Value Ref Range   Glucose-Capillary 59 (*) 70 - 99 mg/dL   Comment 1 Documented in Chart     Comment 2 Notify RN    CBG MONITORING, ED     Status: Abnormal   Collection Time    09/11/13 11:48 PM      Result Value Ref Range   Glucose-Capillary 203 (*) 70 - 99 mg/dL   Comment 1 Notify RN    MRSA PCR SCREENING     Status: None   Collection Time    09/12/13 12:15 AM      Result Value Ref Range   MRSA by PCR NEGATIVE  NEGATIVE   Comment:            The GeneXpert MRSA Assay (FDA     approved for NASAL specimens     only), is one component of a     comprehensive MRSA colonization     surveillance program. It is not     intended to diagnose MRSA     infection nor to guide or     monitor treatment for     MRSA infections.  GLUCOSE, CAPILLARY     Status: Abnormal   Collection Time    09/12/13 12:21 AM      Result Value Ref Range   Glucose-Capillary 125 (*) 70 - 99 mg/dL  TROPONIN I     Status: None   Collection Time    09/12/13 12:44 AM      Result Value Ref Range   Troponin I <0.30  <0.30 ng/mL   Comment:            Due to the release kinetics of cTnI,     a negative result within the first hours     of the onset of symptoms does not rule out     myocardial infarction with certainty.     If myocardial infarction is still suspected,     repeat the test at appropriate intervals.  GLUCOSE, CAPILLARY     Status: Abnormal   Collection Time    09/12/13 12:54 AM      Result Value Ref Range   Glucose-Capillary 146 (*) 70 - 99 mg/dL  GLUCOSE, CAPILLARY     Status: Abnormal   Collection Time    09/12/13  1:35 AM      Result Value Ref Range   Glucose-Capillary 156 (*) 70 - 99 mg/dL  GLUCOSE, CAPILLARY     Status: Abnormal   Collection Time    09/12/13  1:52 AM  Result Value Ref Range   Glucose-Capillary 175 (*) 70 - 99 mg/dL   Comment 1 Documented in Chart     Comment 2 Notify RN    GLUCOSE, CAPILLARY      Status: Abnormal   Collection Time    09/12/13  2:32 AM      Result Value Ref Range   Glucose-Capillary 190 (*) 70 - 99 mg/dL   Comment 1 Notify RN    GLUCOSE, CAPILLARY     Status: Abnormal   Collection Time    09/12/13  3:04 AM      Result Value Ref Range   Glucose-Capillary 214 (*) 70 - 99 mg/dL  GLUCOSE, CAPILLARY     Status: Abnormal   Collection Time    09/12/13  4:10 AM      Result Value Ref Range   Glucose-Capillary 208 (*) 70 - 99 mg/dL  COMPREHENSIVE METABOLIC PANEL     Status: Abnormal   Collection Time    09/12/13  4:46 AM      Result Value Ref Range   Sodium 134 (*) 137 - 147 mEq/L   Potassium 3.7  3.7 - 5.3 mEq/L   Chloride 101  96 - 112 mEq/L   CO2 19  19 - 32 mEq/L   Glucose, Bld 219 (*) 70 - 99 mg/dL   BUN 11  6 - 23 mg/dL   Creatinine, Ser 0.73  0.50 - 1.10 mg/dL   Calcium 8.9  8.4 - 10.5 mg/dL   Total Protein 6.4  6.0 - 8.3 g/dL   Albumin 3.5  3.5 - 5.2 g/dL   AST 25  0 - 37 U/L   ALT 13  0 - 35 U/L   Alkaline Phosphatase 75  39 - 117 U/L   Total Bilirubin 0.6  0.3 - 1.2 mg/dL   GFR calc non Af Amer >90  >90 mL/min   GFR calc Af Amer >90  >90 mL/min   Comment: (NOTE)     The eGFR has been calculated using the CKD EPI equation.     This calculation has not been validated in all clinical situations.     eGFR's persistently <90 mL/min signify possible Chronic Kidney     Disease.   Anion gap 14  5 - 15  CBC     Status: Abnormal   Collection Time    09/12/13  4:46 AM      Result Value Ref Range   WBC 9.6  4.0 - 10.5 K/uL   RBC 3.76 (*) 3.87 - 5.11 MIL/uL   Hemoglobin 11.2 (*) 12.0 - 15.0 g/dL   HCT 33.2 (*) 36.0 - 46.0 %   MCV 88.3  78.0 - 100.0 fL   MCH 29.8  26.0 - 34.0 pg   MCHC 33.7  30.0 - 36.0 g/dL   RDW 12.4  11.5 - 15.5 %   Platelets 184  150 - 400 K/uL  PROTIME-INR     Status: None   Collection Time    09/12/13  4:46 AM      Result Value Ref Range   Prothrombin Time 14.4  11.6 - 15.2 seconds   INR 1.12  0.00 - 1.49  BLOOD GAS,  ARTERIAL     Status: Abnormal   Collection Time    09/12/13  5:28 AM      Result Value Ref Range   FIO2 30.00     Delivery systems VENTILATOR     Mode PRESSURE REGULATED VOLUME CONTROL  VT 500     Rate 16     Peep/cpap 5.0     pH, Arterial 7.455 (*) 7.350 - 7.450   pCO2 arterial 26.4 (*) 35.0 - 45.0 mmHg   pO2, Arterial 80.0  80.0 - 100.0 mmHg   Bicarbonate 18.3 (*) 20.0 - 24.0 mEq/L   TCO2 16.7  0 - 100 mmol/L   Acid-base deficit 4.9 (*) 0.0 - 2.0 mmol/L   O2 Saturation 96.8     Patient temperature 37.0     Collection site RIGHT RADIAL     Drawn by [id number]     Sample type ARTERIAL     Allens test (pass/fail) PASS  PASS  TROPONIN I     Status: None   Collection Time    09/12/13  7:03 AM      Result Value Ref Range   Troponin I <0.30  <0.30 ng/mL   Comment:            Due to the release kinetics of cTnI,     a negative result within the first hours     of the onset of symptoms does not rule out     myocardial infarction with certainty.     If myocardial infarction is still suspected,     repeat the test at appropriate intervals.  GLUCOSE, CAPILLARY     Status: Abnormal   Collection Time    09/12/13  8:32 AM      Result Value Ref Range   Glucose-Capillary 177 (*) 70 - 99 mg/dL   Comment 1 Documented in Chart     Comment 2 Notify RN    GLUCOSE, CAPILLARY     Status: Abnormal   Collection Time    09/12/13 11:04 AM      Result Value Ref Range   Glucose-Capillary 166 (*) 70 - 99 mg/dL  GLUCOSE, CAPILLARY     Status: Abnormal   Collection Time    09/12/13 12:43 PM      Result Value Ref Range   Glucose-Capillary 175 (*) 70 - 99 mg/dL   Comment 1 Documented in Chart     Comment 2 Notify RN    TROPONIN I     Status: None   Collection Time    09/12/13 12:54 PM      Result Value Ref Range   Troponin I <0.30  <0.30 ng/mL   Comment:            Due to the release kinetics of cTnI,     a negative result within the first hours     of the onset of symptoms does not rule  out     myocardial infarction with certainty.     If myocardial infarction is still suspected,     repeat the test at appropriate intervals.  GLUCOSE, CAPILLARY     Status: Abnormal   Collection Time    09/12/13  2:24 PM      Result Value Ref Range   Glucose-Capillary 196 (*) 70 - 99 mg/dL   Comment 1 Documented in Chart     Comment 2 Notify RN    GLUCOSE, CAPILLARY     Status: Abnormal   Collection Time    09/12/13  4:45 PM      Result Value Ref Range   Glucose-Capillary 232 (*) 70 - 99 mg/dL   Comment 1 Documented in Chart     Comment 2 Notify RN    GLUCOSE, CAPILLARY  Status: Abnormal   Collection Time    09/12/13  9:06 PM      Result Value Ref Range   Glucose-Capillary 273 (*) 70 - 99 mg/dL   Comment 1 Documented in Chart     Comment 2 Notify RN    GLUCOSE, CAPILLARY     Status: Abnormal   Collection Time    09/12/13 10:49 PM      Result Value Ref Range   Glucose-Capillary 293 (*) 70 - 99 mg/dL  GLUCOSE, CAPILLARY     Status: Abnormal   Collection Time    09/13/13  2:42 AM      Result Value Ref Range   Glucose-Capillary 344 (*) 70 - 99 mg/dL  COMPREHENSIVE METABOLIC PANEL     Status: Abnormal   Collection Time    09/13/13  4:54 AM      Result Value Ref Range   Sodium 140  137 - 147 mEq/L   Potassium 3.8  3.7 - 5.3 mEq/L   Chloride 106  96 - 112 mEq/L   CO2 20  19 - 32 mEq/L   Glucose, Bld 350 (*) 70 - 99 mg/dL   BUN 8  6 - 23 mg/dL   Creatinine, Ser 0.80  0.50 - 1.10 mg/dL   Calcium 8.2 (*) 8.4 - 10.5 mg/dL   Total Protein 5.6 (*) 6.0 - 8.3 g/dL   Albumin 2.7 (*) 3.5 - 5.2 g/dL   AST 16  0 - 37 U/L   ALT 9  0 - 35 U/L   Alkaline Phosphatase 59  39 - 117 U/L   Total Bilirubin 0.4  0.3 - 1.2 mg/dL   GFR calc non Af Amer 80 (*) >90 mL/min   GFR calc Af Amer >90  >90 mL/min   Comment: (NOTE)     The eGFR has been calculated using the CKD EPI equation.     This calculation has not been validated in all clinical situations.     eGFR's persistently <90 mL/min  signify possible Chronic Kidney     Disease.   Anion gap 14  5 - 15  CBC     Status: Abnormal   Collection Time    09/13/13  4:54 AM      Result Value Ref Range   WBC 8.8  4.0 - 10.5 K/uL   RBC 3.27 (*) 3.87 - 5.11 MIL/uL   Hemoglobin 9.8 (*) 12.0 - 15.0 g/dL   HCT 28.7 (*) 36.0 - 46.0 %   MCV 87.8  78.0 - 100.0 fL   MCH 30.0  26.0 - 34.0 pg   MCHC 34.1  30.0 - 36.0 g/dL   RDW 12.4  11.5 - 15.5 %   Platelets 154  150 - 400 K/uL  PROTIME-INR     Status: Abnormal   Collection Time    09/13/13  4:54 AM      Result Value Ref Range   Prothrombin Time 16.4 (*) 11.6 - 15.2 seconds   INR 1.32  0.00 - 1.49  GLUCOSE, CAPILLARY     Status: Abnormal   Collection Time    09/13/13  5:04 AM      Result Value Ref Range   Glucose-Capillary 349 (*) 70 - 99 mg/dL  GLUCOSE, CAPILLARY     Status: Abnormal   Collection Time    09/13/13  6:13 AM      Result Value Ref Range   Glucose-Capillary 343 (*) 70 - 99 mg/dL   Comment 1 Documented  in Chart     Comment 2 Notify RN    BLOOD GAS, ARTERIAL     Status: Abnormal   Collection Time    09/13/13  7:26 AM      Result Value Ref Range   FIO2 30.00     Delivery systems VENTILATOR     Mode PRESSURE REGULATED VOLUME CONTROL     VT 500.00     Rate 12     Peep/cpap 5.0     pH, Arterial 7.456 (*) 7.350 - 7.450   pCO2 arterial 28.6 (*) 35.0 - 45.0 mmHg   pO2, Arterial 86.8  80.0 - 100.0 mmHg   Bicarbonate 19.9 (*) 20.0 - 24.0 mEq/L   TCO2 18.2  0 - 100 mmol/L   Acid-base deficit 2.7 (*) 0.0 - 2.0 mmol/L   O2 Saturation 97.0     Collection site RIGHT RADIAL     Drawn by [id number]     Sample type ARTERIAL     Allens test (pass/fail) PASS  PASS  GLUCOSE, CAPILLARY     Status: Abnormal   Collection Time    09/13/13  7:46 AM      Result Value Ref Range   Glucose-Capillary 280 (*) 70 - 99 mg/dL   Comment 1 Documented in Chart     Comment 2 Notify RN      ABGS  Recent Labs  09/13/13 0726  PHART 7.456*  PO2ART 86.8  TCO2 18.2  HCO3 19.9*    CULTURES Recent Results (from the past 240 hour(s))  MRSA PCR SCREENING     Status: None   Collection Time    09/12/13 12:15 AM      Result Value Ref Range Status   MRSA by PCR NEGATIVE  NEGATIVE Final   Comment:            The GeneXpert MRSA Assay (FDA     approved for NASAL specimens     only), is one component of a     comprehensive MRSA colonization     surveillance program. It is not     intended to diagnose MRSA     infection nor to guide or     monitor treatment for     MRSA infections.   Studies/Results: Abd 1 View (kub)  09/12/2013   CLINICAL DATA:  NG tube placement.  Patient found unresponsive.  EXAM: ABDOMEN - 1 VIEW  COMPARISON:  07/20/2013  FINDINGS: Marked gaseous distention of the stomach. Enteric tube tip is in the upper mid abdomen, likely in the upper stomach but the proximal side hole is at the EG junction, likely in the distal esophagus. Advancement is suggested. Calcifications in the upper abdomen appear to represent pancreatic calcification and probably suggests chronic pancreatitis.  IMPRESSION: Enteric tube tip appears to be in the upper stomach with proximal side hole likely in the distal esophagus. Advancement is suggested.   Electronically Signed   By: Lucienne Capers M.D.   On: 09/12/2013 01:23   Ct Head Wo Contrast  09/11/2013   CLINICAL DATA:  Altered mental status.  The patient is unresponsive.  EXAM: CT HEAD WITHOUT CONTRAST  TECHNIQUE: Contiguous axial images were obtained from the base of the skull through the vertex without intravenous contrast.  COMPARISON:  07/08/2011  FINDINGS: No mass lesion. No midline shift. No acute hemorrhage or hematoma. No extra-axial fluid collections. No evidence of acute infarction. There is mild diffuse atrophy with no ventricular dilatation. Brain parenchyma is otherwise normal. Osseous structures  are normal.  IMPRESSION: No significant abnormality.  Slight diffuse atrophy, unchanged.   Electronically Signed   By: Rozetta Nunnery M.D.   On: 09/11/2013 22:23   Portable Chest Xray In Am  09/13/2013   CLINICAL DATA:  Ventilator.  EXAM: PORTABLE CHEST - 1 VIEW  COMPARISON:  09/11/2013  FINDINGS: Endotracheal tube has tip approximately 4.5 cm above the carina. Enteric tube has been advanced and courses into the region of the stomach and off the inferior portion of the film as tip is not visualized. There has been placement of a right-sided PICC line which has tip over the region of the cavoatrial junction.  Lungs are adequately inflated without consolidation or effusion. There is no pneumothorax. Cardiomediastinal silhouette and remainder of the exam is unchanged.  IMPRESSION: No acute cardiopulmonary disease.  Tubes and lines as described.   Electronically Signed   By: Marin Olp M.D.   On: 09/13/2013 07:51   Dg Chest Portable 1 View  09/11/2013   CLINICAL DATA:  Post intubation.  EXAM: PORTABLE CHEST - 1 VIEW  COMPARISON:  04/28/2013  FINDINGS: Endotracheal tube is been placed with tip measuring 3.1 cm above the carina. Enteric tube was placed with tip over the EG junction, likely in the distal esophagus. Proximal side hole is in the mid esophageal region. Normal heart size and pulmonary vascularity. No focal airspace disease or consolidation in the lungs. No pneumothorax. No blunting of costophrenic angles.  IMPRESSION: Endotracheal tube tip measures 3.1 cm above the carinal. Enteric tube tip appears to be in the distal esophagus.   Electronically Signed   By: Lucienne Capers M.D.   On: 09/11/2013 22:11    Medications:  Prior to Admission:  Prescriptions prior to admission  Medication Sig Dispense Refill  . albuterol (PROAIR HFA) 108 (90 BASE) MCG/ACT inhaler Inhale 2 puffs into the lungs every 6 (six) hours as needed for wheezing or shortness of breath.      Marland Kitchen atorvastatin (LIPITOR) 40 MG tablet Take 1 tablet (40 mg total) by mouth daily at 6 PM.  30 tablet  0  . doxepin (SINEQUAN) 50 MG capsule Take 50 mg by mouth  at bedtime.      . ergocalciferol (VITAMIN D2) 50000 UNITS capsule Take 50,000 Units by mouth daily.      Marland Kitchen esomeprazole (NEXIUM) 40 MG capsule Take 40 mg by mouth daily.       . feeding supplement (GLUCERNA SHAKE) LIQD Take 237 mLs by mouth 3 (three) times daily between meals.  30 Can  2  . folic acid (FOLVITE) 1 MG tablet Take 1 mg by mouth daily.      . hyoscyamine (LEVSIN, ANASPAZ) 0.125 MG tablet Take 0.125 mg by mouth 4 (four) times daily - after meals and at bedtime.      . insulin aspart (NOVOLOG FLEXPEN) 100 UNIT/ML SOPN FlexPen Inject 5 Units into the skin 3 (three) times daily with meals.  10 mL  3  . insulin glargine (LANTUS) 100 UNIT/ML injection Inject 0.24 mLs (24 Units total) into the skin at bedtime.  10 mL  12  . lisinopril (PRINIVIL,ZESTRIL) 10 MG tablet Take 0.5 tablets (5 mg total) by mouth daily.      Marland Kitchen lubiprostone (AMITIZA) 8 MCG capsule Take 8 mcg by mouth 2 (two) times daily with a meal.      . megestrol (MEGACE) 40 MG tablet Take 40 mg by mouth 2 (two) times daily.      Marland Kitchen  metoprolol (LOPRESSOR) 50 MG tablet Take 50 mg by mouth 2 (two) times daily.      . Multiple Vitamin (MULTIVITAMIN WITH MINERALS) TABS tablet Take 1 tablet by mouth daily.      Marland Kitchen oxycodone (ROXICODONE) 30 MG immediate release tablet Take 30 mg by mouth every 4 (four) hours as needed for pain.      . potassium bicarbonate (K-LYTE) 25 MEQ disintegrating tablet Take 25 mEq by mouth daily.       Marland Kitchen thiamine 100 MG tablet Take 1 tablet (100 mg total) by mouth daily.  30 tablet  0   Scheduled: . antiseptic oral rinse  15 mL Mouth Rinse QID  . chlorhexidine  15 mL Mouth Rinse BID  . enoxaparin (LOVENOX) injection  30 mg Subcutaneous Q24H  . folic acid  1 mg Oral Daily  . levETIRAcetam  1,000 mg Intravenous Q12H  . LORazepam  0-4 mg Intravenous Q6H   Followed by  . [START ON 09/14/2013] LORazepam  0-4 mg Intravenous Q12H  . multivitamin with minerals  1 tablet Oral Daily  . pantoprazole (PROTONIX) IV  40  mg Intravenous Q24H  . sodium chloride  10-40 mL Intracatheter Q12H  . thiamine  100 mg Oral Daily   Or  . thiamine  100 mg Intravenous Daily   Continuous: . sodium chloride 75 mL/hr at 09/13/13 0249  . propofol 50 mcg/kg/min (09/13/13 0600)   [id number], ipratropium-albuterol, LORazepam, LORazepam, ondansetron (ZOFRAN) IV, ondansetron, sodium chloride  Assesment: There is an error in my physical examination. She is not uncooperative she is intubated and sedated and on mechanical ventilation. She is approaching the need for tube feedings. If she's not able to be extubated today she should be started on tube feedings. With her fever there is concern that she may have aspirated during the period of time that she was unconscious. Chest x-ray does not show pneumonia and this may be a central fever but it's probably safer to go ahead and treat her with antibiotics Principal Problem:   Acute respiratory failure requiring reintubation Active Problems:   Hypoglycemia   Metabolic encephalopathy   Alcohol abuse   H/O chronic pancreatitis   Type II or unspecified type diabetes mellitus without mention of complication, uncontrolled   probable Seizures due to metabolic disorder    Plan: Attempt weaning again today. If she's not able to come off the ventilator I think she'll need tube feedings. I will start Zosyn    LOS: 2 days   Allsion Nogales L 09/13/2013, 8:05 AM

## 2013-09-13 NOTE — Progress Notes (Addendum)
Note: This document was prepared with digital dictation and possible smart phrase technology. Any transcriptional errors that result from this process are unintentional.   Erica [name] [id number] DOB: 12-02-1955 DOA: 09/11/2013 PCP: Octavio Graves, DO  Brief narrative:  58 y/o ?, h/o poorly controlled IDDM for 20 yrs, multiple prior [4-6] admits DKA , Etoh abuse, Prior h/o NSTEMI/Demand ischemia 9/14,  Son on telephone noted sugar had "bottomed out"  to a blood glucose of 10 .He last saw her wnl ~35 min the EMS truck came .  She apparently was posturing and had R gaze , given narcan-no effect.  Not been drinking recently or anything along those lines-he states her normal sugars are 200's She spiked a fever of 102.1 and blood cultures were obtained-it was felt that these may be centrally related fevers vs. pneumonia but x-rays have been negative for that. She was started on Zosyn for fever 7/29  Past medical history-As per Problem list Chart reviewed as below- Reviewed  Consultants:  Pulmonology  Procedures:  IO RLE 7/28  Vent Mode:  [-] PRVC FiO2 (%):  [30 %] 30 % Set Rate:  [12 bmp] 12 bmp Vt Set:  [500 mL] 500 mL PEEP:  [5 cmH20] 5 cmH20 Plateau Pressure:  [18 cmH20-25 cmH20] 18 cmH20   Antibiotics:  Zosyn 7/29   Subjective  More awake with weaning of sedation this morning Seems to be breathing over and above the ventilator Not orienting as yet and not able to follow simple commands such as  "raise your right hand if you understand me" Nursing reports mild fevers overnight   Objective    Interim History: Reviewed  Telemetry:   sinus rhythm 90s  Objective: Filed Vitals:   09/13/13 0630 09/13/13 0645 09/13/13 0700 09/13/13 0800  BP: 129/73 155/90 158/103 121/77  Pulse: 86 92 93 84  Temp:    99.4 F (37.4 C)  TempSrc:    Core (Comment)  Resp: 12 13 15 12   Height:      Weight:      SpO2: 99% 99% 99% 100%    Intake/Output Summary (Last 24  hours) at 09/13/13 0850 Last data filed at 09/13/13 0815  Gross per 24 hour  Intake 2450.83 ml  Output   3550 ml  Net -1099.17 ml    Exam:  General: Sedated on ventilator of Cardiovascular: S1-S2 tachycardic regular rate rhythm Respiratory: Clinically clear no to Abdomen: Soft nontender nondistended Skin: I/o noted RLE Neuro: sleepy   Data Reviewed: Basic Metabolic Panel:  Recent Labs Lab 09/11/13 2122 09/12/13 0446 09/13/13 0454  NA 138 134* 140  K 3.5* 3.7 3.8  CL 104 101 106  CO2 20 19 20   GLUCOSE 51* 219* 350*  BUN 13 11 8   CREATININE 0.83 0.73 0.80  CALCIUM 9.1 8.9 8.2*   Liver Function Tests:  Recent Labs Lab 09/11/13 2122 09/12/13 0446 09/13/13 0454  AST 26 25 16   ALT 14 13 9   ALKPHOS 75 75 59  BILITOT 0.3 0.6 0.4  PROT 6.8 6.4 5.6*  ALBUMIN 3.6 3.5 2.7*   No results found for this basename: LIPASE, AMYLASE,  in the last 168 hours  Recent Labs Lab 09/11/13 2123  AMMONIA 49   CBC:  Recent Labs Lab 09/11/13 2122 09/12/13 0446 09/13/13 0454  WBC 7.5 9.6 8.8  NEUTROABS 4.9  --   --   HGB 10.8* 11.2* 9.8*  HCT 31.5* 33.2* 28.7*  MCV 89.2 88.3 87.8  PLT 213 184 154  Cardiac Enzymes:  Recent Labs Lab 09/11/13 2122 09/12/13 0044 09/12/13 0703 09/12/13 1254  TROPONINI <0.30 <0.30 <0.30 <0.30   BNP: No components found with this basename: POCBNP,  CBG:  Recent Labs Lab 09/12/13 2249 09/13/13 0242 09/13/13 0504 09/13/13 0613 09/13/13 0746  GLUCAP 293* 344* 349* 343* 280*    Recent Results (from the past 240 hour(s))  MRSA PCR SCREENING     Status: None   Collection Time    09/12/13 12:15 AM      Result Value Ref Range Status   MRSA by PCR NEGATIVE  NEGATIVE Final   Comment:            The GeneXpert MRSA Assay (FDA     approved for NASAL specimens     only), is one component of a     comprehensive MRSA colonization     surveillance program. It is not     intended to diagnose MRSA     infection nor to guide or      monitor treatment for     MRSA infections.     Studies:              All Imaging reviewed and is as per above notation   Scheduled Meds: . antiseptic oral rinse  15 mL Mouth Rinse QID  . chlorhexidine  15 mL Mouth Rinse BID  . enoxaparin (LOVENOX) injection  30 mg Subcutaneous Q24H  . folic acid  1 mg Oral Daily  . levETIRAcetam  1,000 mg Intravenous Q12H  . LORazepam  0-4 mg Intravenous Q6H   Followed by  . [START ON 09/14/2013] LORazepam  0-4 mg Intravenous Q12H  . multivitamin with minerals  1 tablet Oral Daily  . pantoprazole (PROTONIX) IV  40 mg Intravenous Q24H  . piperacillin-tazobactam (ZOSYN)  IV  3.375 g Intravenous Q8H  . sodium chloride  10-40 mL Intracatheter Q12H  . thiamine  100 mg Oral Daily   Or  . thiamine  100 mg Intravenous Daily   Continuous Infusions: . sodium chloride 75 mL/hr at 09/13/13 0800  . propofol Stopped (09/13/13 0830)     Assessment/Plan: 1. Toxic metabolic encephalopathy with seizures-likely 2/41m to profound hypoglycemia with seizures-intubated for airway protection. Patient more awake alert now.   2. Fever-spike to 102F 7/28, blood culture x2 pending. Obtain urine culture. Patient has history of being found down therefore risk of this being aspiration. Other concerns although less likely are ventilator associated pneumonia or fever of central origin from her from hypoglycemia and neurological damage. Chest x-ray shows some confluence is in the right base per my review but this is a single view film.  Zosyn was started 7/29. 3.  Seizure-likely secondary to profound hypoglycemia-monitor. See above.  We have consulted neurology to assess the patient in consult however this may be all secondary to hypoglycemia-EEG has been ordered and is pending appreciate neurology input. Continue Keppra 1000 twice a day until neurology sees the patient -EEG is pending on this patient 4.  Acute respiratory failure-secondary possibly to hypoglycemia causing a  seizure-deferred management to pulmonology. Attempt trial wean as patient appears to be alert and potentially can get off ventilator. Blood gas shows PaO2 of 86% on PRVC -appreciate pulmonology input 5. Labile diabetes mellitus-we will keep her on D5/C.'s saline 50 cc per hour reassess. We will recheck blood sugars every hour for the next 6 hours.  Is n.p.o. Currently-blood sugars have been 280-350 therefore we will add Lantus @ 1/2 of home  dose = 12 units. Every 6 sensitive insulin coverage ordered 6. Moderate malnutrition-risk for worsening. Consider tube feeds in the next 24-48 hours 7. History hypertension-lisinopril 0.5 daily on hold, metoprolol 50 twice a day on hold and hydralazine 10 milligrams IV every 6 when necessary 8. Possible history COPD-continue albuterol when necessary on ventilator every 4 hours 9. History of alcohol use in the past-apparently not using no. Monitor and CIWA protocol for now. 10. Prior NST MI-currently stable.  Code Status:  full  Family Communication:  discussed with son at bedside in detail   Disposition Plan:  inpatient    Today's summary   Attempt ventilator wean and cut back propofol per protocol  Added Lantus 12 units, sliding scale  EEG to be followed-neurology to see  Added hydralazine when necessary blood pressure  Urine culture performed  Consider tube feeds in the morning  I spent over 30 minutes of critical care time assessing this patient and formulating a plan of care and discussed findings with her family  Verneita Griffes, MD  [facility] Pager [phone] 09/13/2013, 8:50 AM    LOS: 2 days

## 2013-09-13 NOTE — Progress Notes (Signed)
Pt started weaning at 0900. The Pt became very agitated. Her RR increased and her HR. The Pt's secretions are worse this morning and her BBs ar crs now. The Pt was awake but not able to follow any commands. She didn't seem to acknowledge that her son was here. There is an EEG today.

## 2013-09-13 NOTE — Progress Notes (Signed)
ANTIBIOTIC CONSULT NOTE - INITIAL  Pharmacy Consult for Zosyn Indication: rule out pneumonia  Allergies  Allergen Reactions  . Aspirin Palpitations    Patient Measurements: Height: 5' (152.4 cm) Weight: 89 lb 15.9 oz (40.82 kg) IBW/kg (Calculated) : 45.5  Vital Signs: Temp: 99.4 F (37.4 C) (07/29 0800) Temp src: Core (Comment) (07/29 0800) BP: 121/77 mmHg (07/29 0800) Pulse Rate: 84 (07/29 0800) Intake/Output from previous day: 07/28 0701 - 07/29 0700 In: 2485.6 [I.V.:2385.6; IV Piggyback:100] Out: 3550 [Urine:3550] Intake/Output from this shift: Total I/O In: 90.3 [I.V.:90.3] Out: -   Labs:  Recent Labs  09/11/13 2122 09/12/13 0446 09/13/13 0454  WBC 7.5 9.6 8.8  HGB 10.8* 11.2* 9.8*  PLT 213 184 154  CREATININE 0.83 0.73 0.80   Estimated Creatinine Clearance: 49.4 ml/min (by C-G formula based on Cr of 0.8). No results found for this basename: VANCOTROUGH, Corlis Leak, VANCORANDOM, [place], GENTPEAK, GENTRANDOM, TOBRATROUGH, TOBRAPEAK, TOBRARND, AMIKACINPEAK, AMIKACINTROU, AMIKACIN,  in the last 72 hours   Microbiology: Recent Results (from the past 720 hour(s))  MRSA PCR SCREENING     Status: None   Collection Time    09/12/13 12:15 AM      Result Value Ref Range Status   MRSA by PCR NEGATIVE  NEGATIVE Final   Comment:            The GeneXpert MRSA Assay (FDA     approved for NASAL specimens     only), is one component of a     comprehensive MRSA colonization     surveillance program. It is not     intended to diagnose MRSA     infection nor to guide or     monitor treatment for     MRSA infections.    Medical History: Past Medical History  Diagnosis Date  . Chronic diarrhea   . History of kidney stones   . Heart murmur   . Neuropathy     Hx: of  . GERD (gastroesophageal reflux disease)   . Diabetes mellitus     fasting blood sugar 110-120s    Medications:  Scheduled:  . antiseptic oral rinse  15 mL Mouth Rinse QID  . chlorhexidine   15 mL Mouth Rinse BID  . enoxaparin (LOVENOX) injection  30 mg Subcutaneous Q24H  . folic acid  1 mg Oral Daily  . levETIRAcetam  1,000 mg Intravenous Q12H  . LORazepam  0-4 mg Intravenous Q6H   Followed by  . [START ON 09/14/2013] LORazepam  0-4 mg Intravenous Q12H  . multivitamin with minerals  1 tablet Oral Daily  . pantoprazole (PROTONIX) IV  40 mg Intravenous Q24H  . piperacillin-tazobactam (ZOSYN)  IV  3.375 g Intravenous Q8H  . sodium chloride  10-40 mL Intracatheter Q12H  . thiamine  100 mg Oral Daily   Or  . thiamine  100 mg Intravenous Daily   Assessment: 58 yo F who was admitted after being found unresponsive due to hypoglycemic episode.  She is currently intubated.  She spiked fever of 102.48F overnight.   She is starting on Zosyn for possible aspiration PNA.   CXR is currently negative for PNA.  WBC is normal.   Zosyn 7/29>>  Goal of Therapy:  Eradicate infection.  Plan:  Zosyn 3.375gm IV Q8h to be infused over 4hrs Monitor renal function and cx data   Biagio Borg 09/13/2013,8:44 AM

## 2013-09-14 ENCOUNTER — Inpatient Hospital Stay (HOSPITAL_COMMUNITY): Payer: Medicaid Other

## 2013-09-14 LAB — GLUCOSE, CAPILLARY
GLUCOSE-CAPILLARY: 94 mg/dL (ref 70–99)
Glucose-Capillary: 120 mg/dL — ABNORMAL HIGH (ref 70–99)
Glucose-Capillary: 121 mg/dL — ABNORMAL HIGH (ref 70–99)
Glucose-Capillary: 137 mg/dL — ABNORMAL HIGH (ref 70–99)
Glucose-Capillary: 165 mg/dL — ABNORMAL HIGH (ref 70–99)
Glucose-Capillary: 99 mg/dL (ref 70–99)

## 2013-09-14 LAB — BLOOD GAS, ARTERIAL
Acid-base deficit: 1.5 mmol/L (ref 0.0–2.0)
Bicarbonate: 21.3 mEq/L (ref 20.0–24.0)
Drawn by: 21310
FIO2: 30 %
LHR: 12 {breaths}/min
O2 Saturation: 96.8 %
PEEP/CPAP: 5 cmH2O
PH ART: 7.499 — AB (ref 7.350–7.450)
PO2 ART: 71.8 mmHg — AB (ref 80.0–100.0)
Patient temperature: 37
TCO2: 19.4 mmol/L (ref 0–100)
VT: 500 mL
pCO2 arterial: 27.6 mmHg — ABNORMAL LOW (ref 35.0–45.0)

## 2013-09-14 LAB — CBC WITH DIFFERENTIAL/PLATELET
Basophils Absolute: 0 10*3/uL (ref 0.0–0.1)
Basophils Relative: 0 % (ref 0–1)
EOS ABS: 0.4 10*3/uL (ref 0.0–0.7)
Eosinophils Relative: 7 % — ABNORMAL HIGH (ref 0–5)
HCT: 27.3 % — ABNORMAL LOW (ref 36.0–46.0)
Hemoglobin: 9.3 g/dL — ABNORMAL LOW (ref 12.0–15.0)
LYMPHS ABS: 1.3 10*3/uL (ref 0.7–4.0)
Lymphocytes Relative: 22 % (ref 12–46)
MCH: 29.8 pg (ref 26.0–34.0)
MCHC: 34.1 g/dL (ref 30.0–36.0)
MCV: 87.5 fL (ref 78.0–100.0)
MONOS PCT: 7 % (ref 3–12)
Monocytes Absolute: 0.4 10*3/uL (ref 0.1–1.0)
Neutro Abs: 3.7 10*3/uL (ref 1.7–7.7)
Neutrophils Relative %: 64 % (ref 43–77)
PLATELETS: 124 10*3/uL — AB (ref 150–400)
RBC: 3.12 MIL/uL — AB (ref 3.87–5.11)
RDW: 12.5 % (ref 11.5–15.5)
WBC: 5.9 10*3/uL (ref 4.0–10.5)

## 2013-09-14 LAB — BASIC METABOLIC PANEL
Anion gap: 12 (ref 5–15)
BUN: 6 mg/dL (ref 6–23)
CALCIUM: 8.3 mg/dL — AB (ref 8.4–10.5)
CO2: 22 mEq/L (ref 19–32)
Chloride: 111 mEq/L (ref 96–112)
Creatinine, Ser: 0.68 mg/dL (ref 0.50–1.10)
GFR calc Af Amer: >90 mL/min (ref >90)
GLUCOSE: 180 mg/dL — AB (ref 70–99)
Potassium: 3.2 mEq/L — ABNORMAL LOW (ref 3.7–5.3)
Sodium: 145 mEq/L (ref 137–147)

## 2013-09-14 LAB — URINALYSIS, ROUTINE W REFLEX MICROSCOPIC
BILIRUBIN URINE: NEGATIVE
GLUCOSE, UA: NEGATIVE mg/dL
HGB URINE DIPSTICK: NEGATIVE
Leukocytes, UA: NEGATIVE
Nitrite: NEGATIVE
PROTEIN: NEGATIVE mg/dL
Specific Gravity, Urine: 1.02 (ref 1.005–1.030)
Urobilinogen, UA: 0.2 mg/dL (ref 0.0–1.0)
pH: 5.5 (ref 5.0–8.0)

## 2013-09-14 LAB — TRIGLYCERIDES: Triglycerides: 220 mg/dL — ABNORMAL HIGH (ref <150)

## 2013-09-14 MED ORDER — POTASSIUM CHLORIDE CRYS ER 20 MEQ PO TBCR
40.0000 meq | EXTENDED_RELEASE_TABLET | Freq: Every day | ORAL | Status: DC
  Administered 2013-09-14 – 2013-09-19 (×6): 40 meq via ORAL
  Filled 2013-09-14 (×6): qty 2

## 2013-09-14 NOTE — Progress Notes (Signed)
RT extubated Pt to 4L [place], initial SAT 96% then increased to 100%. RT decreased O2 to 2L [place] and SATS are still 100%. HR after extubation 102, RR 32. Pt is responding to Pt but seems to be more relaxed since ET tube has been pulled. Pt likes laying on her right side. Pt's HR was elevated during weaning her BP was 167/103 and now the BP is 139/60. VT 366-407.

## 2013-09-14 NOTE — Progress Notes (Signed)
NUTRITION FOLLOW UP  Intervention:   -Recommend initiate nutrition support if family decides on aggressive measures: Consider starting Vital 1.2 AF @ 20 ml/hr via OGT and increase 10 ml/hr every 12 hours to goal rate of 30 ml/hr  Regimen at goal rate will provide 864 kcals (1252 kcals with current propofol rate), 54 grams protein, and 584 ml fluid. This will meet 71% of estimated kcal needs (100% of needs with current propofol rate) and 100% of estimated protein needs.    Nutrition Dx:   Inadequate oral intake related to inability to eat as evidenced by NPO; ongoing  Goal:   Pt will meet >90% of estimated nutritional needs; goal not met  Monitor:   Respiratory status, initiation/advancement/tolerance of nutrition support measures, weight changes, labs, I/O's  Assessment:   Pt with hx of iddm found down by her family tonight who called ems. ems found pt her glucose was 10. On arrival to ED after given an amp of D50, glucose was up to 300 (per ed records). Pt was posturing on arrival with fixed rightward gaze, but no overt seizure activity. Pt was given narcan with no response. She was intubated to protect her airway, she had good vital signs. Soon after intubation, she started becoming more responsive, moving all of her extemities and fighting the vent at which point propofol gtt was initiated. No family in ED, or icu. Pt ED w/u reveals a normal ct head with no evidence of acute abnormality, normal electolytes and renal function. Neg trop, neg cxr and ua. uds pos for benzos. etoh level neg. hgb 10.8. Wbc normal. Glucose repeatedly rises some then dropping, has received 3 amps d50 and now on d51/2ns gtt. Pt is moderately sedated on ventilator.  EEG reveals no brain activity. MD to speak with family today re: goals of care. Considering terminal wean vs trach.   Patient remains intubated on ventilator support MV: 8.6 L/min Temp (24hrs), Avg:99.5 F (37.5 C), Min:99.3 F (37.4 C), Max:99.7 F  (37.6 C)  Propofol: 14.7 ml/hr (388 kcals)  Lab reviewed. NA now WNL. K: 3.2, being repleted. Glucose: 180. CBGs: 94-165.   Nutrition Focused Physical Exam:  Subcutaneous Fat:  Orbital Region: WDL Upper Arm Region: WDL Thoracic and Lumbar Region: WDL  Muscle:  Temple Region: severe depletion Clavicle Bone Region: WDL Clavicle and Acromion Bone Region: WDL Scapular Bone Region: WDL Dorsal Hand: n/a Patellar Region: severe depletion Anterior Thigh Region: severe depletion Posterior Calf Region: moderate depletion  Edema: none present  Height: Ht Readings from Last 1 Encounters:  09/12/13 5' (1.524 m)    Weight Status:   Wt Readings from Last 1 Encounters:  09/12/13 89 lb 15.9 oz (40.82 kg)    Re-estimated needs:  Kcal: 1211.2 Protein: 53-63 grams Fluid: >1.2 L  Skin: skin tear rt lower leg  Diet Order: NPO   Intake/Output Summary (Last 24 hours) at 09/14/13 0924 Last data filed at 09/14/13 0600  Gross per 24 hour  Intake 2088.15 ml  Output   1670 ml  Net 418.15 ml    Last BM: 09/13/13   Labs:   Recent Labs Lab 09/12/13 0446 09/13/13 0454 09/14/13 0407  NA 134* 140 145  K 3.7 3.8 3.2*  CL 101 106 111  CO2 _0 BUN _1 CREATININE 0.73 0.80 0.68  CALCIUM 8.9 8.2* 8.3*  GLUCOSE 219* 350* 180*    CBG (last 3)   Recent Labs  09/14/13 09/14/13 0452 09/14/13 0747  GLUCAP  137* 165* 94    Scheduled Meds: . antiseptic oral rinse  15 mL Mouth Rinse QID  . chlorhexidine  15 mL Mouth Rinse BID  . enoxaparin (LOVENOX) injection  30 mg Subcutaneous Q24H  . folic acid  1 mg Oral Daily  . insulin aspart  0-9 Units Subcutaneous Q4H  . levETIRAcetam  1,000 mg Intravenous Q12H  . LORazepam  0-4 mg Intravenous Q12H  . multivitamin with minerals  1 tablet Oral Daily  . pantoprazole (PROTONIX) IV  40 mg Intravenous Q24H  . piperacillin-tazobactam (ZOSYN)  IV  3.375 g Intravenous Q8H  . potassium chloride  40 mEq Oral Daily  . sodium  chloride  10-40 mL Intracatheter Q12H  . thiamine  100 mg Oral Daily   Or  . thiamine  100 mg Intravenous Daily    Continuous Infusions: . sodium chloride 75 mL/hr at 09/13/13 2000  . propofol 60 mcg/kg/min (09/14/13 6834)    Mikenzie Mccannon A. Jimmye Norman, RD, LDN Pager: [phone]

## 2013-09-14 NOTE — Progress Notes (Signed)
Dr. Merlene Laughter present at bedside to do a neurological assessment.  MD asked that sedation be turned off at this time for assessment.  Orders received and carried out.  Schonewitz, Eulis Canner 09/14/2013

## 2013-09-14 NOTE — Procedures (Signed)
Extubation Procedure Note  Patient Details:   Name: Erica [name] DOB: 1955-03-23 MRN: [id number]   Airway Documentation:  Airway 7.5 mm (Active)  Secured at (cm) 24 cm 09/14/2013  3:02 PM  Measured From Lips 09/14/2013  3:02 PM  [place] 09/14/2013  3:02 PM  Secured By [employer] 09/14/2013  3:02 PM  Tube Holder Repositioned Yes 09/14/2013  3:02 PM  Cuff Pressure (cm H2O) 25 cm H2O 09/14/2013  7:29 AM  Site Condition Dry 09/14/2013  3:02 PM    Evaluation  O2 sats: stable throughout Complications: No apparent complications Patient did tolerate procedure well. Bilateral Breath Sounds: Diminished Suctioning: Oral;Airway Yes  Elsie Stain 09/14/2013, 3:38 PM

## 2013-09-14 NOTE — Progress Notes (Signed)
Dr. Verlon Au at bedside and requesting to do another trial run weaning off of ventilator.  Respiratory called to bedside to assist with weaning. Schonewitz, Eulis Canner 09/14/2013

## 2013-09-14 NOTE — Progress Notes (Signed)
Pt is intubated and Rt was called to place Pt on wean. RT placed Pt on 5/5 cpap /PS. Bp 158/89, HR 109, rr 30. Pt has followed all direction. NIF 21, FVC .7L. Pt leak test good. RT called Dr. Luan Pulling with result and he said to extubate.

## 2013-09-14 NOTE — Progress Notes (Signed)
Apparently her EEG shows abnormal brain activity. She has been posturing when her sedation is decreased. I don't know if she has any chance of meaningful recovery from this. I don't think we can wean him per standard protocol because I don't think she can protect her airway. Dr. Verlon Au plans to discuss the situation with her family today. I suppose the choices are to extubate and see how she does or to proceed toward potential tracheostomy if it is felt that she has a chance of meaningful recovery.

## 2013-09-14 NOTE — Progress Notes (Signed)
Brief visit with family and patient for support. Family asked for prayer as they expressed their relief that at this point she seems to be much improved. Prayed.

## 2013-09-14 NOTE — Progress Notes (Signed)
Note: This document was prepared with digital dictation and possible smart phrase technology. Any transcriptional errors that result from this process are unintentional.   Erica [name] [id number] DOB: 1955-04-18 DOA: 09/11/2013 PCP: Octavio Graves, DO  Brief narrative:  58 y/o ?, h/o poorly controlled IDDM for 20 yrs, multiple prior [4-6] admits DKA , Etoh abuse, Prior h/o NSTEMI/Demand ischemia 9/14,  Son on telephone noted sugar had "bottomed out"  to a blood glucose of 10 .He last saw her wnl ~35 min the EMS truck came .  She apparently was posturing and had R gaze , given narcan-no effect.  Not been drinking recently or anything along those lines-he states her normal sugars are 200's She spiked a fever of 102.1 and blood cultures were obtained-it was felt that these may be centrally related fevers vs. pneumonia but x-rays have been negative for that. She was started on Zosyn for fever 7/29 Her mentation was poor and she was not following commands until 7/30 pm and then suddenly got better. She was extubated 7/30  Past medical history-As per Problem list Chart reviewed as below- Reviewed  Consultants:  Pulmonology  Procedures:  IO RLE 7/28  Vent Mode:  [-] CPAP FiO2 (%):  [30 %] 30 % Set Rate:  [12 bmp] 12 bmp Vt Set:  [500 mL] 500 mL PEEP:  [5 cmH20] 5 cmH20 Pressure Support:  [5 cmH20] 5 cmH20 Plateau Pressure:  [17 cmH20-20 cmH20] 20 cmH20   Antibiotics:  Zosyn 7/29   Subjective   Somnolent and non-responsive this am-on Propofol More engaging this pm and tolerated T-bar trial Extuabated ~ 15:30   Objective    Interim History: Reviewed  Telemetry:   sinus rhythm 90s  Objective: Filed Vitals:   09/14/13 1400 09/14/13 1500 09/14/13 1540 09/14/13 1600  BP: 152/78 168/103 149/73 138/105  Pulse: 87 98 104 100  Temp:      TempSrc:      Resp: 12 14 21 24   Height:      Weight:      SpO2: 100% 100%  100%    Intake/Output Summary (Last 24  hours) at 09/14/13 1636 Last data filed at 09/14/13 1618  Gross per 24 hour  Intake 3034.42 ml  Output   1220 ml  Net 1814.42 ml    Exam:  General:  Cardiovascular: S1-S2 tachycardic regular rate rhythm Respiratory: Clinically clear no to Abdomen: Soft nontender nondistended Skin: I/o noted RLE Neuro: sleepy   Data Reviewed: Basic Metabolic Panel:  Recent Labs Lab 09/11/13 2122 09/12/13 0446 09/13/13 0454 09/14/13 0407  NA 138 134* 140 145  K 3.5* 3.7 3.8 3.2*  CL 104 101 106 111  CO2 20 19 20 22   GLUCOSE 51* 219* 350* 180*  BUN 13 11 8 6   CREATININE 0.83 0.73 0.80 0.68  CALCIUM 9.1 8.9 8.2* 8.3*   Liver Function Tests:  Recent Labs Lab 09/11/13 2122 09/12/13 0446 09/13/13 0454  AST 26 25 16   ALT 14 13 9   ALKPHOS 75 75 59  BILITOT 0.3 0.6 0.4  PROT 6.8 6.4 5.6*  ALBUMIN 3.6 3.5 2.7*   No results found for this basename: LIPASE, AMYLASE,  in the last 168 hours  Recent Labs Lab 09/11/13 2123  AMMONIA 49   CBC:  Recent Labs Lab 09/11/13 2122 09/12/13 0446 09/13/13 0454 09/14/13 0407  WBC 7.5 9.6 8.8 5.9  NEUTROABS 4.9  --   --  3.7  HGB 10.8* 11.2* 9.8* 9.3*  HCT 31.5* 33.2*  28.7* 27.3*  MCV 89.2 88.3 87.8 87.5  PLT 213 184 154 124*   Cardiac Enzymes:  Recent Labs Lab 09/11/13 2122 09/12/13 0044 09/12/13 0703 09/12/13 1254  TROPONINI <0.30 <0.30 <0.30 <0.30   BNP: No components found with this basename: POCBNP,  CBG:  Recent Labs Lab 09/13/13 1644 09/14/13 09/14/13 0452 09/14/13 0747 09/14/13 1122  GLUCAP 251* 137* 165* 94 120*    Recent Results (from the past 240 hour(s))  MRSA PCR SCREENING     Status: None   Collection Time    09/12/13 12:15 AM      Result Value Ref Range Status   MRSA by PCR NEGATIVE  NEGATIVE Final   Comment:            The GeneXpert MRSA Assay (FDA     approved for NASAL specimens     only), is one component of a     comprehensive MRSA colonization     surveillance program. It is not      intended to diagnose MRSA     infection nor to guide or     monitor treatment for     MRSA infections.  CULTURE, BLOOD (ROUTINE X 2)     Status: None   Collection Time    09/12/13 12:54 PM      Result Value Ref Range Status   Specimen Description BLOOD PICC LINE DRAWN BY RN LS   Final   Special Requests     Final   Value: BOTTLES DRAWN AEROBIC AND ANAEROBIC AEB=8CC ANA=6CC   Culture NO GROWTH 1 DAY   Final   Report Status PENDING   Incomplete  CULTURE, BLOOD (ROUTINE X 2)     Status: None   Collection Time    09/12/13  1:00 PM      Result Value Ref Range Status   Specimen Description BLOOD LEFT FOOT   Final   Special Requests BOTTLES DRAWN AEROBIC ONLY 4CC   Final   Culture NO GROWTH 1 DAY   Final   Report Status PENDING   Incomplete     Studies:              All Imaging reviewed and is as per above notation   Scheduled Meds: . antiseptic oral rinse  15 mL Mouth Rinse QID  . chlorhexidine  15 mL Mouth Rinse BID  . enoxaparin (LOVENOX) injection  30 mg Subcutaneous Q24H  . folic acid  1 mg Oral Daily  . insulin aspart  0-9 Units Subcutaneous Q4H  . levETIRAcetam  1,000 mg Intravenous Q12H  . LORazepam  0-4 mg Intravenous Q12H  . multivitamin with minerals  1 tablet Oral Daily  . pantoprazole (PROTONIX) IV  40 mg Intravenous Q24H  . piperacillin-tazobactam (ZOSYN)  IV  3.375 g Intravenous Q8H  . potassium chloride  40 mEq Oral Daily  . sodium chloride  10-40 mL Intracatheter Q12H  . thiamine  100 mg Oral Daily   Or  . thiamine  100 mg Intravenous Daily   Continuous Infusions: . sodium chloride 75 mL/hr at 09/14/13 1600  . propofol Stopped (09/14/13 1400)     Assessment/Plan: 1. Toxic metabolic encephalopathy with seizures-likely 2/3m to profound hypoglycemia with seizures-intubated for airway protection. Patient more awake alert and extubated 2. Fever-spike to 102F 7/28, blood culture x2 pending. Obtain urine culture. Patient has history of being found down therefore  risk of this being aspiration. Other concerns although less likely are ventilator associated pneumonia or fever  of central origin from her from hypoglycemia and neurological damage. Chest x-ray shows some confluence is in the right base per my review but this is a single view film.  Zosyn was started 7/29. She had further fever 100.2 7/30-foley was d/c-take oral temps.  BC x 2 redrawn 7/30.  cultr from 7/28 ng so far 3.  Seizure-likely secondary to profound hypoglycemia-monitor. See above. -EEG=severe encephalopathy but she is mentating somewhat. Continue Keppra 1000 twice a day for now 4.  Acute respiratory failure-secondary possibly to hypoglycemia causing a seizure-extubated.  Keep on nasal o2 5. Labile diabetes mellitus-allow clear liquids. Currently-blood sugars have been 120-250's-consider addition lantus if able to tolerate diet 6. severe malnutrition-risk for worsening.  Diet in am if able to toelrate 7. History hypertension-lisinopril 0.5 daily on hold, metoprolol 50 twice a day on hold for now and hydralazine 10 milligrams IV every 6 when necessary 8. Possible history COPD-continue albuterol when necessary on ventilator every 4 hours 9. History of alcohol use in the past-apparently not using no. D/c CIWA 10. Prior NST MI-currently stable.  Code Status:  full  Family Communication:  discussed with multiple family members and updated in detail.  Transfer out to telel in am if all stable Disposition Plan:  Inpatient-ordered PT/Ot input    Verneita Griffes, MD  [facility] Pager [phone] 09/14/2013, 4:36 PM    LOS: 3 days

## 2013-09-14 NOTE — Progress Notes (Signed)
Patient ID: Erica [name], female   DOB: [birth year], 58 y.o.   MRN: [id number]   [place] A. Merlene Laughter, MD     [email or web address]          Erica [name] is an 58 y.o. female.   Assessment/Plan: Encephalopathy due to severe hyperglycemia. Unfortunately, there may be some irreversible brain damage that may have been done. She does seem to have eye deviation and posturing on examination although she has no posturing at this time. There is increased risk of a hypoglycemic induced seizures. We'll continue with the Keppra. EEG shows severe slowing but no epileptiform activity.    The nursing staff reports that the patient has been essentially unchanged. She still does not follow commands. No clinical seizures. They have attempted to wean the patient but there is concern that she is to drowse to protect her airway. I did discuss the case at length with the family. Given the profound hyperglycemia is likely the patient has irreversible brain damage. There is some impairment from sedation but is hard to figure out how much is contributing. The case of discussed with the hospitalist. They're going to attempt today weaned tomorrow and see how she does appear further discussion will be had with the family in terms of how aggressive they want to be. If the family is going to be aggressive, she may need to be to have a PEG and tracheostomy. Otherwise the terminal wean may be attempted.  GENERAL: The patient is intubated. The examination 5-10 minutes after the patient has been taken off propofol.  HEENT: Supple. Atraumatic normocephalic.  ABDOMEN: soft  EXTREMITIES: No edema  BACK: Normal.  SKIN: Normal by inspection.  MENTAL STATUS: She lays in bed with eyes closed. She opens her eyes to deep Painful stimuli and moves all 4 extremities vigorously but does not follow commands. CRANIAL NERVES: Pupils are equal, round and reactive to light; extra ocular movements are full; coronary  reflexes are intact. Cough and gag reflexes are intact. Facial muscle strength is symmetric.  MOTOR: She moves both sides well. No clear posturing is noted at this time.  COORDINATION: There is no dysmetria noted. No tremors.  REFLEXES: Deep tendon reflexes are symmetrical and normal.   SENSATION: She reports is painful some light bilaterally.    EEG shows 1. This recording is grossly abnormal showing severe generalized slowing indicating a generalized encephalopathy. There are no epileptiform activities observed.      Objective: Vital signs in last 24 hours: Temp:  [99.4 F (37.4 C)-99.7 F (37.6 C)] 99.7 F (37.6 C) (07/29 2000) Pulse Rate:  [79-101] 88 (07/30 1200) Resp:  [10-22] 12 (07/30 1200) BP: (127-163)/(68-98) 151/80 mmHg (07/30 1200) SpO2:  [100 %] 100 % (07/30 1200) FiO2 (%):  [30 %] 30 % (07/30 1146)  Intake/Output from previous day: 07/29 0701 - 07/30 0700 In: 2179.9 [I.V.:2029.9; IV Piggyback:150] Out: 3009 [Urine:1570; Emesis/NG output:100] Intake/Output this shift: Total I/O In: 600 [I.V.:450; IV Piggyback:150] Out: -  Nutritional status: NPO   Lab Results: Results for orders placed during the hospital encounter of 09/11/13 (from the past 48 hour(s))  GLUCOSE, CAPILLARY     Status: Abnormal   Collection Time    09/12/13  2:24 PM      Result Value Ref Range   Glucose-Capillary 196 (*) 70 - 99 mg/dL   Comment 1 Documented in Chart     Comment 2 Notify RN    GLUCOSE, CAPILLARY     Status: Abnormal  Collection Time    09/12/13  4:45 PM      Result Value Ref Range   Glucose-Capillary 232 (*) 70 - 99 mg/dL   Comment 1 Documented in Chart     Comment 2 Notify RN    GLUCOSE, CAPILLARY     Status: Abnormal   Collection Time    09/12/13  9:06 PM      Result Value Ref Range   Glucose-Capillary 273 (*) 70 - 99 mg/dL   Comment 1 Documented in Chart     Comment 2 Notify RN    GLUCOSE, CAPILLARY     Status: Abnormal   Collection Time    09/12/13 10:49  PM      Result Value Ref Range   Glucose-Capillary 293 (*) 70 - 99 mg/dL  GLUCOSE, CAPILLARY     Status: Abnormal   Collection Time    09/13/13  2:42 AM      Result Value Ref Range   Glucose-Capillary 344 (*) 70 - 99 mg/dL  COMPREHENSIVE METABOLIC PANEL     Status: Abnormal   Collection Time    09/13/13  4:54 AM      Result Value Ref Range   Sodium 140  137 - 147 mEq/L   Potassium 3.8  3.7 - 5.3 mEq/L   Chloride 106  96 - 112 mEq/L   CO2 20  19 - 32 mEq/L   Glucose, Bld 350 (*) 70 - 99 mg/dL   BUN 8  6 - 23 mg/dL   Creatinine, Ser 0.80  0.50 - 1.10 mg/dL   Calcium 8.2 (*) 8.4 - 10.5 mg/dL   Total Protein 5.6 (*) 6.0 - 8.3 g/dL   Albumin 2.7 (*) 3.5 - 5.2 g/dL   AST 16  0 - 37 U/L   ALT 9  0 - 35 U/L   Alkaline Phosphatase 59  39 - 117 U/L   Total Bilirubin 0.4  0.3 - 1.2 mg/dL   GFR calc non Af Amer 80 (*) >90 mL/min   GFR calc Af Amer >90  >90 mL/min   Comment: (NOTE)     The eGFR has been calculated using the CKD EPI equation.     This calculation has not been validated in all clinical situations.     eGFR's persistently <90 mL/min signify possible Chronic Kidney     Disease.   Anion gap 14  5 - 15  CBC     Status: Abnormal   Collection Time    09/13/13  4:54 AM      Result Value Ref Range   WBC 8.8  4.0 - 10.5 K/uL   RBC 3.27 (*) 3.87 - 5.11 MIL/uL   Hemoglobin 9.8 (*) 12.0 - 15.0 g/dL   HCT 28.7 (*) 36.0 - 46.0 %   MCV 87.8  78.0 - 100.0 fL   MCH 30.0  26.0 - 34.0 pg   MCHC 34.1  30.0 - 36.0 g/dL   RDW 12.4  11.5 - 15.5 %   Platelets 154  150 - 400 K/uL  PROTIME-INR     Status: Abnormal   Collection Time    09/13/13  4:54 AM      Result Value Ref Range   Prothrombin Time 16.4 (*) 11.6 - 15.2 seconds   INR 1.32  0.00 - 1.49  GLUCOSE, CAPILLARY     Status: Abnormal   Collection Time    09/13/13  5:04 AM      Result Value Ref Range  Glucose-Capillary 349 (*) 70 - 99 mg/dL  GLUCOSE, CAPILLARY     Status: Abnormal   Collection Time    09/13/13  6:13 AM       Result Value Ref Range   Glucose-Capillary 343 (*) 70 - 99 mg/dL   Comment 1 Documented in Chart     Comment 2 Notify RN    BLOOD GAS, ARTERIAL     Status: Abnormal   Collection Time    09/13/13  7:26 AM      Result Value Ref Range   FIO2 30.00     Delivery systems VENTILATOR     Mode PRESSURE REGULATED VOLUME CONTROL     VT 500.00     Rate 12     Peep/cpap 5.0     pH, Arterial 7.456 (*) 7.350 - 7.450   pCO2 arterial 28.6 (*) 35.0 - 45.0 mmHg   pO2, Arterial 86.8  80.0 - 100.0 mmHg   Bicarbonate 19.9 (*) 20.0 - 24.0 mEq/L   TCO2 18.2  0 - 100 mmol/L   Acid-base deficit 2.7 (*) 0.0 - 2.0 mmol/L   O2 Saturation 97.0     Collection site RIGHT RADIAL     Drawn by [id number]     Sample type ARTERIAL     Allens test (pass/fail) PASS  PASS  GLUCOSE, CAPILLARY     Status: Abnormal   Collection Time    09/13/13  7:46 AM      Result Value Ref Range   Glucose-Capillary 280 (*) 70 - 99 mg/dL   Comment 1 Documented in Chart     Comment 2 Notify RN    GLUCOSE, CAPILLARY     Status: Abnormal   Collection Time    09/13/13 11:58 AM      Result Value Ref Range   Glucose-Capillary 298 (*) 70 - 99 mg/dL   Comment 1 Documented in Chart     Comment 2 Notify RN    GLUCOSE, CAPILLARY     Status: Abnormal   Collection Time    09/13/13  4:44 PM      Result Value Ref Range   Glucose-Capillary 251 (*) 70 - 99 mg/dL   Comment 1 Documented in Chart     Comment 2 Notify RN    GLUCOSE, CAPILLARY     Status: Abnormal   Collection Time    09/14/13 12:00 AM      Result Value Ref Range   Glucose-Capillary 137 (*) 70 - 99 mg/dL   Comment 1 Notify RN    CBC WITH DIFFERENTIAL     Status: Abnormal   Collection Time    09/14/13  4:07 AM      Result Value Ref Range   WBC 5.9  4.0 - 10.5 K/uL   RBC 3.12 (*) 3.87 - 5.11 MIL/uL   Hemoglobin 9.3 (*) 12.0 - 15.0 g/dL   HCT 27.3 (*) 36.0 - 46.0 %   MCV 87.5  78.0 - 100.0 fL   MCH 29.8  26.0 - 34.0 pg   MCHC 34.1  30.0 - 36.0 g/dL   RDW 12.5  11.5 - 15.5 %    Platelets 124 (*) 150 - 400 K/uL   Neutrophils Relative % 64  43 - 77 %   Neutro Abs 3.7  1.7 - 7.7 K/uL   Lymphocytes Relative 22  12 - 46 %   Lymphs Abs 1.3  0.7 - 4.0 K/uL   Monocytes Relative 7  3 - 12 %  Monocytes Absolute 0.4  0.1 - 1.0 K/uL   Eosinophils Relative 7 (*) 0 - 5 %   Eosinophils Absolute 0.4  0.0 - 0.7 K/uL   Basophils Relative 0  0 - 1 %   Basophils Absolute 0.0  0.0 - 0.1 K/uL  BASIC METABOLIC PANEL     Status: Abnormal   Collection Time    09/14/13  4:07 AM      Result Value Ref Range   Sodium 145  137 - 147 mEq/L   Potassium 3.2 (*) 3.7 - 5.3 mEq/L   Chloride 111  96 - 112 mEq/L   CO2 22  19 - 32 mEq/L   Glucose, Bld 180 (*) 70 - 99 mg/dL   BUN 6  6 - 23 mg/dL   Creatinine, Ser 0.68  0.50 - 1.10 mg/dL   Calcium 8.3 (*) 8.4 - 10.5 mg/dL   GFR calc non Af Amer >90  >90 mL/min   GFR calc Af Amer >90  >90 mL/min   Comment: (NOTE)     The eGFR has been calculated using the CKD EPI equation.     This calculation has not been validated in all clinical situations.     eGFR's persistently <90 mL/min signify possible Chronic Kidney     Disease.   Anion gap 12  5 - 15  TRIGLYCERIDES     Status: Abnormal   Collection Time    09/14/13  4:07 AM      Result Value Ref Range   Triglycerides 220 (*) <150 mg/dL  GLUCOSE, CAPILLARY     Status: Abnormal   Collection Time    09/14/13  4:52 AM      Result Value Ref Range   Glucose-Capillary 165 (*) 70 - 99 mg/dL   Comment 1 Notify RN    BLOOD GAS, ARTERIAL     Status: Abnormal   Collection Time    09/14/13  5:20 AM      Result Value Ref Range   FIO2 30.00     Delivery systems VENTILATOR     Mode PRESSURE REGULATED VOLUME CONTROL     VT 500     Rate 12.0     Peep/cpap 5.0     pH, Arterial 7.499 (*) 7.350 - 7.450   pCO2 arterial 27.6 (*) 35.0 - 45.0 mmHg   pO2, Arterial 71.8 (*) 80.0 - 100.0 mmHg   Bicarbonate 21.3  20.0 - 24.0 mEq/L   TCO2 19.4  0 - 100 mmol/L   Acid-base deficit 1.5  0.0 - 2.0 mmol/L   O2  Saturation 96.8     Patient temperature 37.0     Collection site LEFT RADIAL     Drawn by [id number]     Sample type ARTERIAL     Allens test (pass/fail) PASS  PASS  GLUCOSE, CAPILLARY     Status: None   Collection Time    09/14/13  7:47 AM      Result Value Ref Range   Glucose-Capillary 94  70 - 99 mg/dL  GLUCOSE, CAPILLARY     Status: Abnormal   Collection Time    09/14/13 11:22 AM      Result Value Ref Range   Glucose-Capillary 120 (*) 70 - 99 mg/dL    Lipid Panel  Recent Labs  09/14/13 0407  TRIG 220*    Studies/Results: Dg Chest 1 View  09/14/2013   CLINICAL DATA:  Ventilator, check endotracheal tube placement  EXAM: CHEST - 1 VIEW  COMPARISON:  09/13/2013  FINDINGS: Cardiac shadow is within normal limits. An endotracheal tube is noted 4 cm above the carina. A nasogastric catheter extends into the stomach. A right-sided PICC line is noted at the cavoatrial junction. The lungs are well aerated bilaterally. No bony abnormality is seen.  IMPRESSION: Tubes and lines as described above.  No acute abnormality is seen.   Electronically Signed   By: Inez Catalina M.D.   On: 09/14/2013 08:07   Portable Chest Xray In Am  09/13/2013   CLINICAL DATA:  Ventilator.  EXAM: PORTABLE CHEST - 1 VIEW  COMPARISON:  09/11/2013  FINDINGS: Endotracheal tube has tip approximately 4.5 cm above the carina. Enteric tube has been advanced and courses into the region of the stomach and off the inferior portion of the film as tip is not visualized. There has been placement of a right-sided PICC line which has tip over the region of the cavoatrial junction.  Lungs are adequately inflated without consolidation or effusion. There is no pneumothorax. Cardiomediastinal silhouette and remainder of the exam is unchanged.  IMPRESSION: No acute cardiopulmonary disease.  Tubes and lines as described.   Electronically Signed   By: Marin Olp M.D.   On: 09/13/2013 07:51    Medications:  Scheduled Meds: . antiseptic oral  rinse  15 mL Mouth Rinse QID  . chlorhexidine  15 mL Mouth Rinse BID  . enoxaparin (LOVENOX) injection  30 mg Subcutaneous Q24H  . folic acid  1 mg Oral Daily  . insulin aspart  0-9 Units Subcutaneous Q4H  . levETIRAcetam  1,000 mg Intravenous Q12H  . LORazepam  0-4 mg Intravenous Q12H  . multivitamin with minerals  1 tablet Oral Daily  . pantoprazole (PROTONIX) IV  40 mg Intravenous Q24H  . piperacillin-tazobactam (ZOSYN)  IV  3.375 g Intravenous Q8H  . potassium chloride  40 mEq Oral Daily  . sodium chloride  10-40 mL Intracatheter Q12H  . thiamine  100 mg Oral Daily   Or  . thiamine  100 mg Intravenous Daily   Continuous Infusions: . sodium chloride 75 mL/hr at 09/14/13 1200  . propofol 50 mcg/kg/min (09/14/13 1200)   PRN Meds:.acetaminophen, hydrALAZINE, ipratropium-albuterol, LORazepam, LORazepam, ondansetron (ZOFRAN) IV, ondansetron, sodium chloride     LOS: 3 days   Danika Kluender A. Merlene Laughter, M.D.  Diplomate, Tax adviser of Psychiatry and Neurology ( Neurology).

## 2013-09-14 NOTE — Progress Notes (Signed)
Pt has no brain activity per EEG. Dr. Luan Pulling said not to wean or extubate because of airway protection. The doctors will talk to her children today.

## 2013-09-15 DIAGNOSIS — J690 Pneumonitis due to inhalation of food and vomit: Secondary | ICD-10-CM

## 2013-09-15 DIAGNOSIS — E440 Moderate protein-calorie malnutrition: Secondary | ICD-10-CM | POA: Diagnosis present

## 2013-09-15 LAB — COMPREHENSIVE METABOLIC PANEL
ALK PHOS: 59 U/L (ref 39–117)
ALT: 11 U/L (ref 0–35)
ANION GAP: 17 — AB (ref 5–15)
AST: 22 U/L (ref 0–37)
Albumin: 3.1 g/dL — ABNORMAL LOW (ref 3.5–5.2)
BILIRUBIN TOTAL: 0.8 mg/dL (ref 0.3–1.2)
BUN: 4 mg/dL — ABNORMAL LOW (ref 6–23)
CO2: 23 meq/L (ref 19–32)
Calcium: 8.8 mg/dL (ref 8.4–10.5)
Chloride: 104 mEq/L (ref 96–112)
Creatinine, Ser: 0.58 mg/dL (ref 0.50–1.10)
GFR calc Af Amer: >90 mL/min (ref >90)
Glucose, Bld: 120 mg/dL — ABNORMAL HIGH (ref 70–99)
POTASSIUM: 3.4 meq/L — AB (ref 3.7–5.3)
SODIUM: 144 meq/L (ref 137–147)
TOTAL PROTEIN: 6.4 g/dL (ref 6.0–8.3)

## 2013-09-15 LAB — GLUCOSE, CAPILLARY
GLUCOSE-CAPILLARY: 107 mg/dL — AB (ref 70–99)
GLUCOSE-CAPILLARY: 110 mg/dL — AB (ref 70–99)
Glucose-Capillary: 103 mg/dL — ABNORMAL HIGH (ref 70–99)
Glucose-Capillary: 97 mg/dL (ref 70–99)
Glucose-Capillary: 93 mg/dL (ref 70–99)
Glucose-Capillary: 256 mg/dL — ABNORMAL HIGH (ref 70–99)
Glucose-Capillary: 256 mg/dL — ABNORMAL HIGH (ref 70–99)

## 2013-09-15 LAB — CBC WITH DIFFERENTIAL/PLATELET
BASOS ABS: 0 10*3/uL (ref 0.0–0.1)
BASOS PCT: 0 % (ref 0–1)
Eosinophils Absolute: 0.5 10*3/uL (ref 0.0–0.7)
Eosinophils Relative: 9 % — ABNORMAL HIGH (ref 0–5)
HCT: 27.8 % — ABNORMAL LOW (ref 36.0–46.0)
Hemoglobin: 9.4 g/dL — ABNORMAL LOW (ref 12.0–15.0)
Lymphocytes Relative: 25 % (ref 12–46)
Lymphs Abs: 1.5 10*3/uL (ref 0.7–4.0)
MCH: 29.7 pg (ref 26.0–34.0)
MCHC: 33.8 g/dL (ref 30.0–36.0)
MCV: 87.7 fL (ref 78.0–100.0)
Monocytes Absolute: 0.6 10*3/uL (ref 0.1–1.0)
Monocytes Relative: 10 % (ref 3–12)
NEUTROS ABS: 3.2 10*3/uL (ref 1.7–7.7)
NEUTROS PCT: 56 % (ref 43–77)
PLATELETS: 139 10*3/uL — AB (ref 150–400)
RBC: 3.17 MIL/uL — ABNORMAL LOW (ref 3.87–5.11)
RDW: 12.5 % (ref 11.5–15.5)
WBC: 5.9 10*3/uL (ref 4.0–10.5)

## 2013-09-15 MED ORDER — METOCLOPRAMIDE HCL 10 MG PO TABS
5.0000 mg | ORAL_TABLET | Freq: Four times a day (QID) | ORAL | Status: DC
  Administered 2013-09-15 – 2013-09-20 (×18): 5 mg via ORAL
  Filled 2013-09-15 (×18): qty 1

## 2013-09-15 MED ORDER — TOBRAMYCIN 0.3 % OP SOLN
1.0000 [drp] | Freq: Four times a day (QID) | OPHTHALMIC | Status: DC
  Administered 2013-09-15 – 2013-09-17 (×8): 1 [drp] via OPHTHALMIC
  Filled 2013-09-15 (×2): qty 5

## 2013-09-15 MED ORDER — SODIUM CHLORIDE 0.9 % IV SOLN
1.5000 g | Freq: Four times a day (QID) | INTRAVENOUS | Status: DC
  Administered 2013-09-15 – 2013-09-16 (×5): 1.5 g via INTRAVENOUS
  Filled 2013-09-15 (×13): qty 1.5

## 2013-09-15 MED ORDER — POTASSIUM CHLORIDE CRYS ER 20 MEQ PO TBCR
40.0000 meq | EXTENDED_RELEASE_TABLET | Freq: Two times a day (BID) | ORAL | Status: AC
  Administered 2013-09-15 (×2): 40 meq via ORAL
  Filled 2013-09-15 (×2): qty 2

## 2013-09-15 MED ORDER — PAROXETINE HCL 20 MG PO TABS
20.0000 mg | ORAL_TABLET | Freq: Every day | ORAL | Status: DC
  Administered 2013-09-15 – 2013-09-20 (×6): 20 mg via ORAL
  Filled 2013-09-15 (×6): qty 1

## 2013-09-15 MED ORDER — MIRTAZAPINE 30 MG PO TABS
15.0000 mg | ORAL_TABLET | Freq: Every day | ORAL | Status: DC
  Administered 2013-09-15 – 2013-09-19 (×5): 15 mg via ORAL
  Filled 2013-09-15 (×5): qty 1

## 2013-09-15 MED ORDER — ALPRAZOLAM 0.5 MG PO TABS
0.5000 mg | ORAL_TABLET | Freq: Four times a day (QID) | ORAL | Status: DC
  Administered 2013-09-15 – 2013-09-16 (×2): 0.5 mg via ORAL
  Filled 2013-09-15 (×5): qty 1

## 2013-09-15 MED ORDER — LISINOPRIL 10 MG PO TABS
10.0000 mg | ORAL_TABLET | Freq: Every day | ORAL | Status: DC
  Administered 2013-09-16 – 2013-09-20 (×5): 10 mg via ORAL
  Filled 2013-09-15 (×5): qty 1

## 2013-09-15 NOTE — Progress Notes (Signed)
Pt transferring upstairs to [address] 300. Pt/family aware of transfer. Pt stable at time of transfer. Report called to receiving nurse, all belongings transferred with pt.

## 2013-09-15 NOTE — Progress Notes (Signed)
Nutrition Follow-up   Intervention:   -Diet advanced per SLP recommendations  -Add Magic cup TID with meals, each supplement provides 290 kcal and 9 grams of protein   -RD will follow nutrition care  Nutrition Dx:   Inadequate oral intake; ongoing   Goal:   Pt will meet >90% of estimated nutritional needs; not met  Monitor:   Diet advancement percent meal and supplement intake, labs and weight trends  Assessment:   Pt successfully extubated yesterday evening and has been assessed by SLP. Diet advanced Dysphagia 2 with Nectar-thick liquids. Will follow and evaluate for adequacy of intake.  Nutrition needs reassessed with change in status.  Height: Ht Readings from Last 1 Encounters:  09/12/13 5' (1.524 m)    Weight Status:   Wt Readings from Last 1 Encounters:  09/12/13 89 lb 15.9 oz (40.82 kg)    Re-estimated needs:  Kcal: 1230-1435 Protein: 61 grams Fluid: >1.2 L  Skin: skin tear lower leg  Diet Order: Dysphagia   Intake/Output Summary (Last 24 hours) at 09/15/13 1559 Last data filed at 09/15/13 1500  Gross per 24 hour  Intake   2340 ml  Output   1650 ml  Net    690 ml    Last BM: 09/14/13   Labs:   Recent Labs Lab 09/13/13 0454 09/14/13 0407 09/15/13 0424  NA 140 145 144  K 3.8 3.2* 3.4*  CL 106 111 104  CO2 _0 BUN 8 6 4*  CREATININE 0.80 0.68 0.58  CALCIUM 8.2* 8.3* 8.8  GLUCOSE 350* 180* 120*    CBG (last 3)   Recent Labs  09/15/13 0446 09/15/13 0737 09/15/13 1118  GLUCAP 97 93 110*    Scheduled Meds: . ampicillin-sulbactam (UNASYN) IV  1.5 g Intravenous Q6H  . antiseptic oral rinse  15 mL Mouth Rinse QID  . chlorhexidine  15 mL Mouth Rinse BID  . enoxaparin (LOVENOX) injection  30 mg Subcutaneous Q24H  . folic acid  1 mg Oral Daily  . insulin aspart  0-9 Units Subcutaneous Q4H  . levETIRAcetam  1,000 mg Intravenous Q12H  . LORazepam  0-4 mg Intravenous Q12H  . multivitamin with minerals  1 tablet Oral Daily  .  pantoprazole (PROTONIX) IV  40 mg Intravenous Q24H  . potassium chloride  40 mEq Oral Daily  . potassium chloride  40 mEq Oral BID  . thiamine  100 mg Oral Daily   Or  . thiamine  100 mg Intravenous Daily    Continuous Infusions: . sodium chloride 75 mL/hr at 09/15/13 1500    Colman Cater MS,RD,CSG,LDN Office: #[phone] Pager: [phone]

## 2013-09-15 NOTE — Progress Notes (Addendum)
[facility] PROGRESS NOTE Interim History: 58 y/o ?, h/o poorly controlled IDDM for 20 yrs, multiple prior [4-6] admits DKA , Etoh abuse, Prior h/o NSTEMI/Demand ischemia 9/14, Son on telephone noted sugar had "bottomed out" to a blood glucose of 10 .He last saw her wnl ~35 min the EMS truck came . She apparently was posturing and had R gaze , given narcan-no effect. Not been drinking recently or anything along those lines-he states her normal sugars are 200's.  Assessment/Plan: Acute Metabolic encephalopathy/  probable Seizures due to metabolic disorder/  Acute respiratory failure requiring reintubation: - most likely due to hypoglycemia. Intubated for airway protection and extubated. - cont keppra, seems that there is some cognitive imparement -  SLP dys 2 diet. - appreciate pulmonary assistance.  Aspiration PNA: - On zosyn started on 7.29.2015. De-escalate to unasyn on 7.31.2015, change to Augmentin on 8.1.2015. - Defervescing. UC 7.29 & BC on 7.28 & 7.3 negative till date. - c.dif negative.  Type II or unspecified type diabetes mellitus without mention of complication, uncontrolled/Hypoglycemia: - not on long acting insulin. - BG stable.  History of alcohol use: - apparently not using. D/c CIWA  Severe protein caloric malnutrition: - Diet in am if able to toelrate. - ensure TID  Hypertension: -lisinopril 0.5 daily on hold, metoprolol 50 twice a day on hold for now and hydralazine 10 milligrams IV every 6 when necessary      Code Status: full  Family Communication: discussed with multiple family members and updated in detail. Transfer out to telel in am if all stable  Disposition Plan: Inpatient-ordered PT/Ot input  Consultants:  none  Procedures:  CXR  BC and UC x2  Antibiotics:  unasyn 7.31  HPI/Subjective: Slow to response. But improved from yesterday. No complains  Objective: Filed Vitals:   09/15/13 0512 09/15/13 0600 09/15/13 0700 09/15/13  0801  BP:  150/82 166/78   Pulse:  88 84   Temp: 98.2 F (36.8 C)   98.4 F (36.9 C)  TempSrc: Oral   Axillary  Resp:  23    Height:      Weight:      SpO2:  100% 100%     Intake/Output Summary (Last 24 hours) at 09/15/13 0824 Last data filed at 09/15/13 0512  Gross per 24 hour  Intake   1025 ml  Output    700 ml  Net    325 ml   Filed Weights   09/11/13 2107 09/12/13 0200  Weight: 40.824 kg (90 lb) 40.82 kg (89 lb 15.9 oz)    Exam:  General: Alert, awake, oriented x3, in no acute distress.  HEENT: No bruits, no goiter.  Heart: Regular rate and rhythm, without murmurs, rubs, gallops.  Lungs: Good air movement, bilateral air movement.  Abdomen: Soft, nontender, nondistended, positive bowel sounds.  Neuro: MENTAL STATUS:  She opens her eyes to deep Painful stimuli and moves all 4 extremities vigorously and  follow commands.  CRANIAL NERVES: Pupils are equal, round and reactive to light; extra ocular movements are full; coronary reflexes are intact. Cough and gag reflexes are intact. Facial muscle strength is symmetric  Data Reviewed: Basic Metabolic Panel:  Recent Labs Lab 09/11/13 2122 09/12/13 0446 09/13/13 0454 09/14/13 0407 09/15/13 0424  NA 138 134* 140 145 144  K 3.5* 3.7 3.8 3.2* 3.4*  CL 104 101 106 111 104  CO2 20 19 20 22 23   GLUCOSE 51* 219* 350* 180* 120*  BUN 13 11 8  6  4*  CREATININE 0.83 0.73 0.80 0.68 0.58  CALCIUM 9.1 8.9 8.2* 8.3* 8.8   Liver Function Tests:  Recent Labs Lab 09/11/13 2122 09/12/13 0446 09/13/13 0454 09/15/13 0424  AST 26 25 16 22   ALT 14 13 9 11   ALKPHOS 75 75 59 59  BILITOT 0.3 0.6 0.4 0.8  PROT 6.8 6.4 5.6* 6.4  ALBUMIN 3.6 3.5 2.7* 3.1*   No results found for this basename: LIPASE, AMYLASE,  in the last 168 hours  Recent Labs Lab 09/11/13 2123  AMMONIA 49   CBC:  Recent Labs Lab 09/11/13 2122 09/12/13 0446 09/13/13 0454 09/14/13 0407 09/15/13 0424  WBC 7.5 9.6 8.8 5.9 5.9  NEUTROABS 4.9  --   --   3.7 3.2  HGB 10.8* 11.2* 9.8* 9.3* 9.4*  HCT 31.5* 33.2* 28.7* 27.3* 27.8*  MCV 89.2 88.3 87.8 87.5 87.7  PLT 213 184 154 124* 139*   Cardiac Enzymes:  Recent Labs Lab 09/11/13 2122 09/12/13 0044 09/12/13 0703 09/12/13 1254  TROPONINI <0.30 <0.30 <0.30 <0.30   BNP (last 3 results)  Recent Labs  09/24/12 1958  PROBNP 38.8   CBG:  Recent Labs Lab 09/14/13 2006 09/15/13 0022 09/15/13 0119 09/15/13 0446 09/15/13 0737  GLUCAP 99 107* 103* 97 93    Recent Results (from the past 240 hour(s))  MRSA PCR SCREENING     Status: None   Collection Time    09/12/13 12:15 AM      Result Value Ref Range Status   MRSA by PCR NEGATIVE  NEGATIVE Final   Comment:            The GeneXpert MRSA Assay (FDA     approved for NASAL specimens     only), is one component of a     comprehensive MRSA colonization     surveillance program. It is not     intended to diagnose MRSA     infection nor to guide or     monitor treatment for     MRSA infections.  CULTURE, BLOOD (ROUTINE X 2)     Status: None   Collection Time    09/12/13 12:54 PM      Result Value Ref Range Status   Specimen Description BLOOD PICC LINE DRAWN BY RN LS   Final   Special Requests     Final   Value: BOTTLES DRAWN AEROBIC AND ANAEROBIC AEB=8CC ANA=6CC   Culture NO GROWTH 1 DAY   Final   Report Status PENDING   Incomplete  CULTURE, BLOOD (ROUTINE X 2)     Status: None   Collection Time    09/12/13  1:00 PM      Result Value Ref Range Status   Specimen Description BLOOD LEFT FOOT   Final   Special Requests BOTTLES DRAWN AEROBIC ONLY 4CC   Final   Culture NO GROWTH 1 DAY   Final   Report Status PENDING   Incomplete  URINE CULTURE     Status: None   Collection Time    09/13/13  5:55 PM      Result Value Ref Range Status   Specimen Description URINE, CATHETERIZED   Final   Special Requests NONE   Final   Culture  Setup Time     Final   Value: 09/13/2013 23:34     Performed at [place]     Final   Value: 40,000 COLONIES/ML     Performed at [facility]   Culture     Final   Value: GRAM NEGATIVE RODS     Performed at [facility]   Report Status PENDING   Incomplete     Studies: Dg Chest 1 View  09/14/2013   CLINICAL DATA:  Ventilator, check endotracheal tube placement  EXAM: CHEST - 1 VIEW  COMPARISON:  09/13/2013  FINDINGS: Cardiac shadow is within normal limits. An endotracheal tube is noted 4 cm above the carina. A nasogastric catheter extends into the stomach. A right-sided PICC line is noted at the cavoatrial junction. The lungs are well aerated bilaterally. No bony abnormality is seen.  IMPRESSION: Tubes and lines as described above.  No acute abnormality is seen.   Electronically Signed   By: Inez Catalina M.D.   On: 09/14/2013 08:07    Scheduled Meds: . antiseptic oral rinse  15 mL Mouth Rinse QID  . chlorhexidine  15 mL Mouth Rinse BID  . enoxaparin (LOVENOX) injection  30 mg Subcutaneous Q24H  . folic acid  1 mg Oral Daily  . insulin aspart  0-9 Units Subcutaneous Q4H  . levETIRAcetam  1,000 mg Intravenous Q12H  . LORazepam  0-4 mg Intravenous Q12H  . multivitamin with minerals  1 tablet Oral Daily  . pantoprazole (PROTONIX) IV  40 mg Intravenous Q24H  . piperacillin-tazobactam (ZOSYN)  IV  3.375 g Intravenous Q8H  . potassium chloride  40 mEq Oral Daily  . sodium chloride  10-40 mL Intracatheter Q12H  . thiamine  100 mg Oral Daily   Or  . thiamine  100 mg Intravenous Daily   Continuous Infusions: . sodium chloride 75 mL/hr at 09/14/13 1700  . propofol Stopped (09/14/13 1400)     Charlynne Cousins  [facility] Pager [phone]. If 8PM-8AM, please contact night-coverage at [email or web address], password [facility] 09/15/2013, 8:24 AM  LOS: 4 days      **Disclaimer: This note may have been dictated with voice recognition software. Similar sounding words can inadvertently be transcribed and this note may contain transcription  errors which may not have been corrected upon publication of note.**

## 2013-09-15 NOTE — Evaluation (Signed)
Clinical/Bedside Swallow Evaluation Patient Details  Name: Erica [name] MRN: [id number] Date of Birth: 12/08/1955  Today's Date: 09/15/2013 Time: 0950-1015 SLP Time Calculation (min): 25 min  Past Medical History:  Past Medical History  Diagnosis Date  . Chronic diarrhea   . History of kidney stones   . Heart murmur   . Neuropathy     Hx: of  . GERD (gastroesophageal reflux disease)   . Diabetes mellitus     fasting blood sugar 110-120s   Past Surgical History:  Past Surgical History  Procedure Laterality Date  . Colonoscopy  02/24/2010  . Tubal ligation    . Multiple extractions with alveoloplasty N/A 06/13/2012    Procedure: MULTIPLE EXTRACION WITH ALVEOLOPLASTY EXTRACT: 18, 19, 20, 21, 22, 24, 25, 27, 28, 29, 30, 31;  Surgeon: Gae Bon, DDS;  Location: [place];  Service: Oral Surgery;  Laterality: N/A;   HPI:  58 y.o. female with PMH of diabetes and multiple prior admits DKA, admitted 7/27, found with glucose of 10 and nonresponsive, intubated upon admission, glucose was repeatedly rising and falling. Toxic metabolic encephalopathy with seizures. Head CT 7/27 unremarkable. EEG 7/30 indicated "no brain activity." Extubated 7/30 in PM (3-day intubation). As of 7/31 has been more reponsive but likely to still have some neurologic injury. Bedside swallow eval ordered to assess readiness for POs post-extubation.   Assessment / Plan / Recommendation Clinical Impression  The pt demonstrated overt s/s of aspiration (cough) with 50% of thin liquid trials, with these signs eliminated on trials of nectar-thick liquid. Suspect mildly delayed swallow initiation. Pt has dentures at home and demonstrated mildly prolonged mastication with trials of dysphagia 2 consistency. Daughter at bedside reported that pt has demonstrated coughing/ getting choked on thin liquids and foods at home for the past 2-3 weeks. Given multiple risk factors including 3-day intubation, family report of recent  s/s of swallowing difficulties, GERD, and current cognitive status, the pt is at high risk of aspiration with thin liquids. Recommend initiating Dysphagia 2 diet, nectar-thick liquids, meds whole in puree or crushed if difficulties arise. Also provide the following: FULL supervision during meals to cue small bites/ sips, allow pt to self-feed with assistance as needed, sit upright 30-60 minutes after meal, cue for intermittent throat clear. ST will continue to follow for diet tolerance/ advancement.     Aspiration Risk  Moderate    Diet Recommendation Dysphagia 2 (Fine chop);Nectar-thick liquid   Liquid Administration via: Cup;No straw Medication Administration: Whole meds with puree Supervision: Staff to assist with self feeding;Full supervision/cueing for compensatory strategies Compensations: Slow rate;Small sips/bites Postural Changes and/or Swallow Maneuvers: Seated upright 90 degrees;Upright 30-60 min after meal    Other  Recommendations Oral Care Recommendations: Oral care BID Other Recommendations: Order thickener from pharmacy   Follow Up Recommendations  24 hour supervision/assistance    Frequency and Duration min 2x/week  2 weeks   Pertinent Vitals/Pain n/a    SLP Swallow Goals     Swallow Study Prior Functional Status       General HPI: 58 y.o. female with PMH of diabetes and multiple prior admits DKA, admitted 7/27, found with glucose of 10 and nonresponsive, intubated upon admission, glucose was repeatedly rising and falling. Toxic metabolic encephalopathy with seizures. Head CT 7/27 unremarkable. EEG 7/30 indicated "no brain activity." Extubated 7/30 in PM (3-day intubation). As of 7/31 has been more reponsive but likely to still have some neurologic injury. Bedside swallow eval ordered to assess readiness for POs  post-extubation. Type of Study: Bedside swallow evaluation Diet Prior to this Study: NPO Temperature Spikes Noted: Yes Respiratory Status: Nasal  cannula History of Recent Intubation: Yes Length of Intubations (days): 3 days Date extubated: 09/14/13 Behavior/Cognition: Alert;Cooperative;Pleasant mood;Requires cueing Oral Cavity - Dentition: Edentulous Self-Feeding Abilities: Able to feed self;Needs assist Patient Positioning: Upright in bed Baseline Vocal Quality: Breathy Volitional Cough: Strong Volitional Swallow: Able to elicit    Oral/Motor/Sensory Function Overall Oral Motor/Sensory Function: Appears within functional limits for tasks assessed Labial ROM: Within Functional Limits Labial Symmetry: Within Functional Limits Labial Strength: Within Functional Limits Lingual ROM: Within Functional Limits Lingual Symmetry: Within Functional Limits Lingual Strength: Within Functional Limits   Ice Chips Ice chips: Within functional limits Presentation: Spoon   Thin Liquid Thin Liquid: Impaired Presentation: Cup Pharyngeal  Phase Impairments: Suspected delayed Swallow;Cough - Immediate    Nectar Thick Nectar Thick Liquid: Within functional limits Presentation: Cup   Honey Thick Honey Thick Liquid: Not tested   Puree Puree: Within functional limits Presentation: Spoon   Solid   GO    Solid: Impaired Presentation: Spoon Oral Phase Impairments: Impaired mastication       Burnice Oestreicher K, MA, CCC-SLP 09/15/2013,10:35 AM

## 2013-09-15 NOTE — Progress Notes (Signed)
Subjective: She became much more responsive yesterday and was able to be extubated. She has no other new complaints. She does seem to have some cognitive impairment still  Objective: Vital signs in last 24 hours: Temp:  [98.2 F (36.8 C)-100.5 F (38.1 C)] 98.4 F (36.9 C) (07/31 0801) Pulse Rate:  [48-104] 84 (07/31 0700) Resp:  [11-42] 23 (07/31 0600) BP: (128-183)/(67-117) 166/78 mmHg (07/31 0700) SpO2:  [95 %-100 %] 100 % (07/31 0700) FiO2 (%):  [30 %] 30 % (07/30 1502) Weight change:  Last BM Date: 09/14/13  Intake/Output from previous day: 07/30 0701 - 07/31 0700 In: 1025 [I.V.:825; IV Piggyback:200] Out: 700 [Urine:700]  PHYSICAL EXAM General appearance: alert, cooperative and mild distress Resp: clear to auscultation bilaterally Cardio: regular rate and rhythm, S1, S2 normal, no murmur, click, rub or gallop GI: soft, non-tender; bowel sounds normal; no masses,  no organomegaly Extremities: extremities normal, atraumatic, no cyanosis or edema  Lab Results:  Results for orders placed during the hospital encounter of 09/11/13 (from the past 48 hour(s))  GLUCOSE, CAPILLARY     Status: Abnormal   Collection Time    09/13/13 11:58 AM      Result Value Ref Range   Glucose-Capillary 298 (*) 70 - 99 mg/dL   Comment 1 Documented in Chart     Comment 2 Notify RN    GLUCOSE, CAPILLARY     Status: Abnormal   Collection Time    09/13/13  4:44 PM      Result Value Ref Range   Glucose-Capillary 251 (*) 70 - 99 mg/dL   Comment 1 Documented in Chart     Comment 2 Notify RN    URINE CULTURE     Status: None   Collection Time    09/13/13  5:55 PM      Result Value Ref Range   Specimen Description URINE, CATHETERIZED     Special Requests NONE     Culture  Setup Time       Value: 09/13/2013 23:34     Performed at [facility]       Value: 40,000 COLONIES/ML     Performed at [facility]   Culture       Value: [place]      Performed at [facility]   Report Status PENDING    GLUCOSE, CAPILLARY     Status: Abnormal   Collection Time    09/14/13 12:00 AM      Result Value Ref Range   Glucose-Capillary 137 (*) 70 - 99 mg/dL   Comment 1 Notify RN    CBC WITH DIFFERENTIAL     Status: Abnormal   Collection Time    09/14/13  4:07 AM      Result Value Ref Range   WBC 5.9  4.0 - 10.5 K/uL   RBC 3.12 (*) 3.87 - 5.11 MIL/uL   Hemoglobin 9.3 (*) 12.0 - 15.0 g/dL   HCT 27.3 (*) 36.0 - 46.0 %   MCV 87.5  78.0 - 100.0 fL   MCH 29.8  26.0 - 34.0 pg   MCHC 34.1  30.0 - 36.0 g/dL   RDW 12.5  11.5 - 15.5 %   Platelets 124 (*) 150 - 400 K/uL   Neutrophils Relative % 64  43 - 77 %   Neutro Abs 3.7  1.7 - 7.7 K/uL   Lymphocytes Relative 22  12 - 46 %   Lymphs Abs  1.3  0.7 - 4.0 K/uL   Monocytes Relative 7  3 - 12 %   Monocytes Absolute 0.4  0.1 - 1.0 K/uL   Eosinophils Relative 7 (*) 0 - 5 %   Eosinophils Absolute 0.4  0.0 - 0.7 K/uL   Basophils Relative 0  0 - 1 %   Basophils Absolute 0.0  0.0 - 0.1 K/uL  BASIC METABOLIC PANEL     Status: Abnormal   Collection Time    09/14/13  4:07 AM      Result Value Ref Range   Sodium 145  137 - 147 mEq/L   Potassium 3.2 (*) 3.7 - 5.3 mEq/L   Chloride 111  96 - 112 mEq/L   CO2 22  19 - 32 mEq/L   Glucose, Bld 180 (*) 70 - 99 mg/dL   BUN 6  6 - 23 mg/dL   Creatinine, Ser 0.68  0.50 - 1.10 mg/dL   Calcium 8.3 (*) 8.4 - 10.5 mg/dL   GFR calc non Af Amer >90  >90 mL/min   GFR calc Af Amer >90  >90 mL/min   Comment: (NOTE)     The eGFR has been calculated using the CKD EPI equation.     This calculation has not been validated in all clinical situations.     eGFR's persistently <90 mL/min signify possible Chronic Kidney     Disease.   Anion gap 12  5 - 15  TRIGLYCERIDES     Status: Abnormal   Collection Time    09/14/13  4:07 AM      Result Value Ref Range   Triglycerides 220 (*) <150 mg/dL  GLUCOSE, CAPILLARY     Status: Abnormal   Collection Time     09/14/13  4:52 AM      Result Value Ref Range   Glucose-Capillary 165 (*) 70 - 99 mg/dL   Comment 1 Notify RN    BLOOD GAS, ARTERIAL     Status: Abnormal   Collection Time    09/14/13  5:20 AM      Result Value Ref Range   FIO2 30.00     Delivery systems VENTILATOR     Mode PRESSURE REGULATED VOLUME CONTROL     VT 500     Rate 12.0     Peep/cpap 5.0     pH, Arterial 7.499 (*) 7.350 - 7.450   pCO2 arterial 27.6 (*) 35.0 - 45.0 mmHg   pO2, Arterial 71.8 (*) 80.0 - 100.0 mmHg   Bicarbonate 21.3  20.0 - 24.0 mEq/L   TCO2 19.4  0 - 100 mmol/L   Acid-base deficit 1.5  0.0 - 2.0 mmol/L   O2 Saturation 96.8     Patient temperature 37.0     Collection site LEFT RADIAL     Drawn by [id number]     Sample type ARTERIAL     Allens test (pass/fail) PASS  PASS  GLUCOSE, CAPILLARY     Status: None   Collection Time    09/14/13  7:47 AM      Result Value Ref Range   Glucose-Capillary 94  70 - 99 mg/dL  GLUCOSE, CAPILLARY     Status: Abnormal   Collection Time    09/14/13 11:22 AM      Result Value Ref Range   Glucose-Capillary 120 (*) 70 - 99 mg/dL  URINALYSIS, ROUTINE W REFLEX MICROSCOPIC     Status: Abnormal   Collection Time    09/14/13  3:58 PM  Result Value Ref Range   Color, Urine YELLOW  YELLOW   APPearance CLEAR  CLEAR   Specific Gravity, Urine 1.020  1.005 - 1.030   pH 5.5  5.0 - 8.0   Glucose, UA NEGATIVE  NEGATIVE mg/dL   Hgb urine dipstick NEGATIVE  NEGATIVE   Bilirubin Urine NEGATIVE  NEGATIVE   Ketones, ur TRACE (*) NEGATIVE mg/dL   Protein, ur NEGATIVE  NEGATIVE mg/dL   Urobilinogen, UA 0.2  0.0 - 1.0 mg/dL   Nitrite NEGATIVE  NEGATIVE   Leukocytes, UA NEGATIVE  NEGATIVE   Comment: MICROSCOPIC NOT DONE ON URINES WITH NEGATIVE PROTEIN, BLOOD, LEUKOCYTES, NITRITE, OR GLUCOSE <1000 mg/dL.  GLUCOSE, CAPILLARY     Status: Abnormal   Collection Time    09/14/13  4:19 PM      Result Value Ref Range   Glucose-Capillary 121 (*) 70 - 99 mg/dL  GLUCOSE, CAPILLARY      Status: None   Collection Time    09/14/13  8:06 PM      Result Value Ref Range   Glucose-Capillary 99  70 - 99 mg/dL   Comment 1 Notify RN    GLUCOSE, CAPILLARY     Status: Abnormal   Collection Time    09/15/13 12:22 AM      Result Value Ref Range   Glucose-Capillary 107 (*) 70 - 99 mg/dL   Comment 1 Notify RN    GLUCOSE, CAPILLARY     Status: Abnormal   Collection Time    09/15/13  1:19 AM      Result Value Ref Range   Glucose-Capillary 103 (*) 70 - 99 mg/dL  COMPREHENSIVE METABOLIC PANEL     Status: Abnormal   Collection Time    09/15/13  4:24 AM      Result Value Ref Range   Sodium 144  137 - 147 mEq/L   Potassium 3.4 (*) 3.7 - 5.3 mEq/L   Chloride 104  96 - 112 mEq/L   CO2 23  19 - 32 mEq/L   Glucose, Bld 120 (*) 70 - 99 mg/dL   BUN 4 (*) 6 - 23 mg/dL   Creatinine, Ser 0.58  0.50 - 1.10 mg/dL   Calcium 8.8  8.4 - 10.5 mg/dL   Total Protein 6.4  6.0 - 8.3 g/dL   Albumin 3.1 (*) 3.5 - 5.2 g/dL   AST 22  0 - 37 U/L   ALT 11  0 - 35 U/L   Alkaline Phosphatase 59  39 - 117 U/L   Total Bilirubin 0.8  0.3 - 1.2 mg/dL   GFR calc non Af Amer >90  >90 mL/min   GFR calc Af Amer >90  >90 mL/min   Comment: (NOTE)     The eGFR has been calculated using the CKD EPI equation.     This calculation has not been validated in all clinical situations.     eGFR's persistently <90 mL/min signify possible Chronic Kidney     Disease.   Anion gap 17 (*) 5 - 15  CBC WITH DIFFERENTIAL     Status: Abnormal   Collection Time    09/15/13  4:24 AM      Result Value Ref Range   WBC 5.9  4.0 - 10.5 K/uL   RBC 3.17 (*) 3.87 - 5.11 MIL/uL   Hemoglobin 9.4 (*) 12.0 - 15.0 g/dL   HCT 27.8 (*) 36.0 - 46.0 %   MCV 87.7  78.0 - 100.0 fL  MCH 29.7  26.0 - 34.0 pg   MCHC 33.8  30.0 - 36.0 g/dL   RDW 12.5  11.5 - 15.5 %   Platelets 139 (*) 150 - 400 K/uL   Neutrophils Relative % 56  43 - 77 %   Neutro Abs 3.2  1.7 - 7.7 K/uL   Lymphocytes Relative 25  12 - 46 %   Lymphs Abs 1.5  0.7 - 4.0 K/uL    Monocytes Relative 10  3 - 12 %   Monocytes Absolute 0.6  0.1 - 1.0 K/uL   Eosinophils Relative 9 (*) 0 - 5 %   Eosinophils Absolute 0.5  0.0 - 0.7 K/uL   Basophils Relative 0  0 - 1 %   Basophils Absolute 0.0  0.0 - 0.1 K/uL  GLUCOSE, CAPILLARY     Status: None   Collection Time    09/15/13  4:46 AM      Result Value Ref Range   Glucose-Capillary 97  70 - 99 mg/dL   Comment 1 Notify RN    GLUCOSE, CAPILLARY     Status: None   Collection Time    09/15/13  7:37 AM      Result Value Ref Range   Glucose-Capillary 93  70 - 99 mg/dL   Comment 1 Documented in Chart     Comment 2 Notify RN      ABGS  Recent Labs  09/14/13 0520  PHART 7.499*  PO2ART 71.8*  TCO2 19.4  HCO3 21.3   CULTURES Recent Results (from the past 240 hour(s))  MRSA PCR SCREENING     Status: None   Collection Time    09/12/13 12:15 AM      Result Value Ref Range Status   MRSA by PCR NEGATIVE  NEGATIVE Final   Comment:            The GeneXpert MRSA Assay (FDA     approved for NASAL specimens     only), is one component of a     comprehensive MRSA colonization     surveillance program. It is not     intended to diagnose MRSA     infection nor to guide or     monitor treatment for     MRSA infections.  CULTURE, BLOOD (ROUTINE X 2)     Status: None   Collection Time    09/12/13 12:54 PM      Result Value Ref Range Status   Specimen Description BLOOD PICC LINE DRAWN BY RN LS   Final   Special Requests     Final   Value: BOTTLES DRAWN AEROBIC AND ANAEROBIC AEB=8CC ANA=6CC   Culture NO GROWTH 1 DAY   Final   Report Status PENDING   Incomplete  CULTURE, BLOOD (ROUTINE X 2)     Status: None   Collection Time    09/12/13  1:00 PM      Result Value Ref Range Status   Specimen Description BLOOD LEFT FOOT   Final   Special Requests BOTTLES DRAWN AEROBIC ONLY 4CC   Final   Culture NO GROWTH 1 DAY   Final   Report Status PENDING   Incomplete  URINE CULTURE     Status: None   Collection Time     09/13/13  5:55 PM      Result Value Ref Range Status   Specimen Description URINE, CATHETERIZED   Final   Special Requests NONE   Final   Culture  Setup  Time     Final   Value: 09/13/2013 23:34     Performed at [facility]     Final   Value: 40,000 COLONIES/ML     Performed at [facility]   Culture     Final   Value: GRAM NEGATIVE RODS     Performed at [facility]   Report Status PENDING   Incomplete   Studies/Results: Dg Chest 1 View  09/14/2013   CLINICAL DATA:  Ventilator, check endotracheal tube placement  EXAM: CHEST - 1 VIEW  COMPARISON:  09/13/2013  FINDINGS: Cardiac shadow is within normal limits. An endotracheal tube is noted 4 cm above the carina. A nasogastric catheter extends into the stomach. A right-sided PICC line is noted at the cavoatrial junction. The lungs are well aerated bilaterally. No bony abnormality is seen.  IMPRESSION: Tubes and lines as described above.  No acute abnormality is seen.   Electronically Signed   By: Inez Catalina M.D.   On: 09/14/2013 08:07    Medications:  Prior to Admission:  Prescriptions prior to admission  Medication Sig Dispense Refill  . albuterol (PROAIR HFA) 108 (90 BASE) MCG/ACT inhaler Inhale 2 puffs into the lungs every 6 (six) hours as needed for wheezing or shortness of breath.      . ALPRAZolam (XANAX) 0.5 MG tablet Take 0.5 mg by mouth 4 (four) times daily.      . chlordiazePOXIDE (LIBRIUM) 25 MG capsule Take 25 mg by mouth 4 (four) times daily - after meals and at bedtime.      Marland Kitchen esomeprazole (NEXIUM) 40 MG capsule Take 40 mg by mouth daily.       . fenofibrate (TRICOR) 145 MG tablet Take 145 mg by mouth daily.      . folic acid (FOLVITE) 1 MG tablet Take 1 mg by mouth daily.      Marland Kitchen gentamicin (GARAMYCIN) 0.3 % ophthalmic solution Place 1 drop into both eyes 3 (three) times daily.      . hyoscyamine (LEVSIN, ANASPAZ) 0.125 MG tablet Take 0.125 mg by mouth 4 (four) times daily - after  meals and at bedtime.      . insulin glargine (LANTUS) 100 UNIT/ML injection Inject 0.24 mLs (24 Units total) into the skin at bedtime.  10 mL  12  . insulin lispro (HUMALOG KWIKPEN) 100 UNIT/ML KiwkPen Inject 5 Units into the skin 3 (three) times daily. Use as directed per sliding scale.      Marland Kitchen lisinopril (PRINIVIL,ZESTRIL) 10 MG tablet Take 10 mg by mouth daily.      Marland Kitchen lubiprostone (AMITIZA) 8 MCG capsule Take 8 mcg by mouth 2 (two) times daily with a meal.      . megestrol (MEGACE) 40 MG tablet Take 40 mg by mouth 2 (two) times daily.      . metoCLOPramide (REGLAN) 5 MG tablet Take 5 mg by mouth 4 (four) times daily.      . metoprolol (LOPRESSOR) 50 MG tablet Take 50 mg by mouth 2 (two) times daily.      . mirtazapine (REMERON) 15 MG tablet Take 15 mg by mouth at bedtime.      . Multiple Vitamin (MULTIVITAMIN WITH MINERALS) TABS tablet Take 1 tablet by mouth daily.      Marland Kitchen oxycodone (ROXICODONE) 30 MG immediate release tablet Take 30 mg by mouth every 4 (four) hours as needed for pain.      Marland Kitchen PARoxetine (PAXIL) 20 MG  tablet Take 20 mg by mouth daily.      . potassium bicarbonate (K-LYTE) 25 MEQ disintegrating tablet Take 25 mEq by mouth daily.       Marland Kitchen rOPINIRole (REQUIP) 1 MG tablet Take 1 mg by mouth at bedtime. BETWEEN 7 PM AND 8 PM.      . traMADol (ULTRAM) 50 MG tablet Take 100 mg by mouth every 6 (six) hours.       Scheduled: . ampicillin-sulbactam (UNASYN) IV  1.5 g Intravenous Q6H  . antiseptic oral rinse  15 mL Mouth Rinse QID  . chlorhexidine  15 mL Mouth Rinse BID  . enoxaparin (LOVENOX) injection  30 mg Subcutaneous Q24H  . folic acid  1 mg Oral Daily  . insulin aspart  0-9 Units Subcutaneous Q4H  . levETIRAcetam  1,000 mg Intravenous Q12H  . LORazepam  0-4 mg Intravenous Q12H  . multivitamin with minerals  1 tablet Oral Daily  . pantoprazole (PROTONIX) IV  40 mg Intravenous Q24H  . potassium chloride  40 mEq Oral Daily  . potassium chloride  40 mEq Oral BID  . thiamine  100  mg Oral Daily   Or  . thiamine  100 mg Intravenous Daily   Continuous: . sodium chloride 75 mL/hr at 09/14/13 1700   DSK:[id number], hydrALAZINE, ipratropium-albuterol, ondansetron (ZOFRAN) IV, ondansetron  Assesment: She was admitted with acute respiratory failure and metabolic encephalopathy. She is better but may have some neurologic injury. She still seems to have some cognitive impairment. 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    LOS: 4 days   Maddux Vanscyoc L 09/15/2013, 8:56 AM

## 2013-09-15 NOTE — Progress Notes (Signed)
Patient ID: Erica [name], female   DOB: 03/30/1955, 58 y.o.   MRN: [id number]   [place] A. Merlene Laughter, MD     [email or web address]          Erica [name] is an 58 y.o. female.   Assessment/Plan: Encephalopathy due to severe hyperglycemia. Also due to medication effect. She has improved significantly after weaning of sedation and been extubated. She likely had will have some residual encephalopathy but time will tell what would be the permanent residual deficit.  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 For now continue with low-dose Keppra.     The patient has had a dramatic improvement. She has been weaned off the ventilator, taken off sedation is now extubated.  GENERAL: Status post extubation. HEENT: Supple. Atraumatic normocephalic.  ABDOMEN: soft  EXTREMITIES: No edema  BACK: Normal.  SKIN: Normal by inspection.  MENTAL STATUS: She is awake and sitting up in bed. She is oriented to place and person but not to time. CRANIAL NERVES: Pupils are equal, round and reactive to light; extra ocular movements are full;  Facial muscle strength is symmetric.  MOTOR: She moves both sides well. No clear posturing is noted at this time.  COORDINATION: There is no dysmetria noted. No tremors.         EEG shows 1. This recording is grossly abnormal showing severe generalized slowing indicating a generalized encephalopathy. There are no epileptiform activities observed.      Objective: Vital signs in last 24 hours: Temp:  [98.1 F (36.7 C)-99.1 F (37.3 C)] 98.1 F (36.7 C) (07/31 1138) Pulse Rate:  [48-104] 86 (07/31 1400) Resp:  [13-26] 14 (07/31 1200) BP: (110-183)/(52-117) 137/99 mmHg (07/31 1400) SpO2:  [95 %-100 %] 100 % (07/31 1400)  Intake/Output from previous day: 07/30 0701 - 07/31 0700 In: 1025 [I.V.:825; IV Piggyback:200] Out: 700 [Urine:700] Intake/Output this shift:   Nutritional status: Dysphagia   Lab Results: Results for  orders placed during the hospital encounter of 09/11/13 (from the past 48 hour(s))  GLUCOSE, CAPILLARY     Status: Abnormal   Collection Time    09/14/13 12:00 AM      Result Value Ref Range   Glucose-Capillary 137 (*) 70 - 99 mg/dL   Comment 1 Notify RN    CBC WITH DIFFERENTIAL     Status: Abnormal   Collection Time    09/14/13  4:07 AM      Result Value Ref Range   WBC 5.9  4.0 - 10.5 K/uL   RBC 3.12 (*) 3.87 - 5.11 MIL/uL   Hemoglobin 9.3 (*) 12.0 - 15.0 g/dL   HCT 27.3 (*) 36.0 - 46.0 %   MCV 87.5  78.0 - 100.0 fL   MCH 29.8  26.0 - 34.0 pg   MCHC 34.1  30.0 - 36.0 g/dL   RDW 12.5  11.5 - 15.5 %   Platelets 124 (*) 150 - 400 K/uL   Neutrophils Relative % 64  43 - 77 %   Neutro Abs 3.7  1.7 - 7.7 K/uL   Lymphocytes Relative 22  12 - 46 %   Lymphs Abs 1.3  0.7 - 4.0 K/uL   Monocytes Relative 7  3 - 12 %   Monocytes Absolute 0.4  0.1 - 1.0 K/uL   Eosinophils Relative 7 (*) 0 - 5 %   Eosinophils Absolute 0.4  0.0 - 0.7 K/uL   Basophils Relative 0  0 - 1 %   Basophils Absolute 0.0  0.0 -  0.1 K/uL  BASIC METABOLIC PANEL     Status: Abnormal   Collection Time    09/14/13  4:07 AM      Result Value Ref Range   Sodium 145  137 - 147 mEq/L   Potassium 3.2 (*) 3.7 - 5.3 mEq/L   Chloride 111  96 - 112 mEq/L   CO2 22  19 - 32 mEq/L   Glucose, Bld 180 (*) 70 - 99 mg/dL   BUN 6  6 - 23 mg/dL   Creatinine, Ser 0.68  0.50 - 1.10 mg/dL   Calcium 8.3 (*) 8.4 - 10.5 mg/dL   GFR calc non Af Amer >90  >90 mL/min   GFR calc Af Amer >90  >90 mL/min   Comment: (NOTE)     The eGFR has been calculated using the CKD EPI equation.     This calculation has not been validated in all clinical situations.     eGFR's persistently <90 mL/min signify possible Chronic Kidney     Disease.   Anion gap 12  5 - 15  TRIGLYCERIDES     Status: Abnormal   Collection Time    09/14/13  4:07 AM      Result Value Ref Range   Triglycerides 220 (*) <150 mg/dL  GLUCOSE, CAPILLARY     Status: Abnormal    Collection Time    09/14/13  4:52 AM      Result Value Ref Range   Glucose-Capillary 165 (*) 70 - 99 mg/dL   Comment 1 Notify RN    BLOOD GAS, ARTERIAL     Status: Abnormal   Collection Time    09/14/13  5:20 AM      Result Value Ref Range   FIO2 30.00     Delivery systems VENTILATOR     Mode PRESSURE REGULATED VOLUME CONTROL     VT 500     Rate 12.0     Peep/cpap 5.0     pH, Arterial 7.499 (*) 7.350 - 7.450   pCO2 arterial 27.6 (*) 35.0 - 45.0 mmHg   pO2, Arterial 71.8 (*) 80.0 - 100.0 mmHg   Bicarbonate 21.3  20.0 - 24.0 mEq/L   TCO2 19.4  0 - 100 mmol/L   Acid-base deficit 1.5  0.0 - 2.0 mmol/L   O2 Saturation 96.8     Patient temperature 37.0     Collection site LEFT RADIAL     Drawn by [id number]     Sample type ARTERIAL     Allens test (pass/fail) PASS  PASS  GLUCOSE, CAPILLARY     Status: None   Collection Time    09/14/13  7:47 AM      Result Value Ref Range   Glucose-Capillary 94  70 - 99 mg/dL  GLUCOSE, CAPILLARY     Status: Abnormal   Collection Time    09/14/13 11:22 AM      Result Value Ref Range   Glucose-Capillary 120 (*) 70 - 99 mg/dL  URINALYSIS, ROUTINE W REFLEX MICROSCOPIC     Status: Abnormal   Collection Time    09/14/13  3:58 PM      Result Value Ref Range   Color, Urine YELLOW  YELLOW   APPearance CLEAR  CLEAR   Specific Gravity, Urine 1.020  1.005 - 1.030   pH 5.5  5.0 - 8.0   Glucose, UA NEGATIVE  NEGATIVE mg/dL   Hgb urine dipstick NEGATIVE  NEGATIVE   Bilirubin Urine NEGATIVE  NEGATIVE  Ketones, ur TRACE (*) NEGATIVE mg/dL   Protein, ur NEGATIVE  NEGATIVE mg/dL   Urobilinogen, UA 0.2  0.0 - 1.0 mg/dL   Nitrite NEGATIVE  NEGATIVE   Leukocytes, UA NEGATIVE  NEGATIVE   Comment: MICROSCOPIC NOT DONE ON URINES WITH NEGATIVE PROTEIN, BLOOD, LEUKOCYTES, NITRITE, OR GLUCOSE <1000 mg/dL.  GLUCOSE, CAPILLARY     Status: Abnormal   Collection Time    09/14/13  4:19 PM      Result Value Ref Range   Glucose-Capillary 121 (*) 70 - 99 mg/dL  GLUCOSE,  CAPILLARY     Status: None   Collection Time    09/14/13  8:06 PM      Result Value Ref Range   Glucose-Capillary 99  70 - 99 mg/dL   Comment 1 Notify RN    GLUCOSE, CAPILLARY     Status: Abnormal   Collection Time    09/15/13 12:22 AM      Result Value Ref Range   Glucose-Capillary 107 (*) 70 - 99 mg/dL   Comment 1 Notify RN    GLUCOSE, CAPILLARY     Status: Abnormal   Collection Time    09/15/13  1:19 AM      Result Value Ref Range   Glucose-Capillary 103 (*) 70 - 99 mg/dL  COMPREHENSIVE METABOLIC PANEL     Status: Abnormal   Collection Time    09/15/13  4:24 AM      Result Value Ref Range   Sodium 144  137 - 147 mEq/L   Potassium 3.4 (*) 3.7 - 5.3 mEq/L   Chloride 104  96 - 112 mEq/L   CO2 23  19 - 32 mEq/L   Glucose, Bld 120 (*) 70 - 99 mg/dL   BUN 4 (*) 6 - 23 mg/dL   Creatinine, Ser 0.58  0.50 - 1.10 mg/dL   Calcium 8.8  8.4 - 10.5 mg/dL   Total Protein 6.4  6.0 - 8.3 g/dL   Albumin 3.1 (*) 3.5 - 5.2 g/dL   AST 22  0 - 37 U/L   ALT 11  0 - 35 U/L   Alkaline Phosphatase 59  39 - 117 U/L   Total Bilirubin 0.8  0.3 - 1.2 mg/dL   GFR calc non Af Amer >90  >90 mL/min   GFR calc Af Amer >90  >90 mL/min   Comment: (NOTE)     The eGFR has been calculated using the CKD EPI equation.     This calculation has not been validated in all clinical situations.     eGFR's persistently <90 mL/min signify possible Chronic Kidney     Disease.   Anion gap 17 (*) 5 - 15  CBC WITH DIFFERENTIAL     Status: Abnormal   Collection Time    09/15/13  4:24 AM      Result Value Ref Range   WBC 5.9  4.0 - 10.5 K/uL   RBC 3.17 (*) 3.87 - 5.11 MIL/uL   Hemoglobin 9.4 (*) 12.0 - 15.0 g/dL   HCT 27.8 (*) 36.0 - 46.0 %   MCV 87.7  78.0 - 100.0 fL   MCH 29.7  26.0 - 34.0 pg   MCHC 33.8  30.0 - 36.0 g/dL   RDW 12.5  11.5 - 15.5 %   Platelets 139 (*) 150 - 400 K/uL   Neutrophils Relative % 56  43 - 77 %   Neutro Abs 3.2  1.7 - 7.7 K/uL   Lymphocytes Relative 25  12 - 46 %   Lymphs Abs 1.5   0.7 - 4.0 K/uL   Monocytes Relative 10  3 - 12 %   Monocytes Absolute 0.6  0.1 - 1.0 K/uL   Eosinophils Relative 9 (*) 0 - 5 %   Eosinophils Absolute 0.5  0.0 - 0.7 K/uL   Basophils Relative 0  0 - 1 %   Basophils Absolute 0.0  0.0 - 0.1 K/uL  GLUCOSE, CAPILLARY     Status: None   Collection Time    09/15/13  4:46 AM      Result Value Ref Range   Glucose-Capillary 97  70 - 99 mg/dL   Comment 1 Notify RN    GLUCOSE, CAPILLARY     Status: None   Collection Time    09/15/13  7:37 AM      Result Value Ref Range   Glucose-Capillary 93  70 - 99 mg/dL   Comment 1 Documented in Chart     Comment 2 Notify RN    GLUCOSE, CAPILLARY     Status: Abnormal   Collection Time    09/15/13 11:18 AM      Result Value Ref Range   Glucose-Capillary 110 (*) 70 - 99 mg/dL   Comment 1 Documented in Chart     Comment 2 Notify RN    GLUCOSE, CAPILLARY     Status: Abnormal   Collection Time    09/15/13  3:51 PM      Result Value Ref Range   Glucose-Capillary 256 (*) 70 - 99 mg/dL   Comment 1 Documented in Chart     Comment 2 Notify RN      Lipid Panel  Recent Labs  09/14/13 0407  TRIG 220*    Studies/Results: Dg Chest 1 View  09/14/2013   CLINICAL DATA:  Ventilator, check endotracheal tube placement  EXAM: CHEST - 1 VIEW  COMPARISON:  09/13/2013  FINDINGS: Cardiac shadow is within normal limits. An endotracheal tube is noted 4 cm above the carina. A nasogastric catheter extends into the stomach. A right-sided PICC line is noted at the cavoatrial junction. The lungs are well aerated bilaterally. No bony abnormality is seen.  IMPRESSION: Tubes and lines as described above.  No acute abnormality is seen.   Electronically Signed   By: Inez Catalina M.D.   On: 09/14/2013 08:07    Medications:  Scheduled Meds: . ALPRAZolam  0.5 mg Oral QID  . ampicillin-sulbactam (UNASYN) IV  1.5 g Intravenous Q6H  . antiseptic oral rinse  15 mL Mouth Rinse QID  . chlorhexidine  15 mL Mouth Rinse BID  .  enoxaparin (LOVENOX) injection  30 mg Subcutaneous Q24H  . folic acid  1 mg Oral Daily  . insulin aspart  0-9 Units Subcutaneous Q4H  . levETIRAcetam  1,000 mg Intravenous Q12H  . lisinopril  10 mg Oral Daily  . LORazepam  0-4 mg Intravenous Q12H  . metoCLOPramide  5 mg Oral QID  . mirtazapine  15 mg Oral QHS  . multivitamin with minerals  1 tablet Oral Daily  . pantoprazole (PROTONIX) IV  40 mg Intravenous Q24H  . PARoxetine  20 mg Oral Daily  . potassium chloride  40 mEq Oral Daily  . potassium chloride  40 mEq Oral BID  . thiamine  100 mg Oral Daily   Or  . thiamine  100 mg Intravenous Daily  . tobramycin  1 drop Both Eyes 4 times per day   Continuous Infusions: .  sodium chloride 75 mL/hr at 09/15/13 1600   PRN Meds:.acetaminophen, hydrALAZINE, ipratropium-albuterol, ondansetron (ZOFRAN) IV, ondansetron     LOS: 4 days   Loi Rennaker A. Merlene Laughter, M.D.  Diplomate, Tax adviser of Psychiatry and Neurology ( Neurology).

## 2013-09-15 NOTE — Evaluation (Signed)
Physical Therapy Evaluation Patient Details Name: Erica [name] MRN: [id number] DOB: 1955/06/28 Today's Date: 09/15/2013   History of Present Illness  58 yo female iddm found down by her family tonight who called ems.  ems found pt her glucose was 10.  On arrival to ED after given an amp of D50, glucose was up to 300 (per ed records).  Pt was posturing on arrival with fixed rightward gaze, but no overt seizure activity.  Pt was given narcan with no response.  She was intubated to protect her airway, she had good vital signs.  Soon after intubation, she started becoming more responsive, moving all of her extemities and fighting the vent at which point propofol gtt was initiated.  No family in ED, or icu.  Pt ED w/u reveals a normal ct head with no evidence of acute abnormality, normal electolytes and renal function.  Neg trop, neg cxr and ua.  uds pos for benzos.  etoh level neg.  hgb 10.8.  Wbc normal.  Glucose repeatedly rises some then dropping, has received 3 amps d50 and now on d51/2ns gtt.  Pt is moderately sedated on ventilator.  Clinical Impression  Pt is a 58 year old female who presents to physical therapy with dx of metabolic encephalopathy.  Pt was intubated on 09/11/13 and extubated 09/14/13.  Pt was able to answers questions about condition (how are you, are you in pain), however hx assessment was deferred to son who was present for evaluation.  During evaluation, pt required min/mod assist for bed mobility skills and mod assist to transfer to standing.  Pt able to maintain standing for ~30 seconds prior to requiring a seated rest break.  Gait not assessed as pt unable to clear feet for gait. Recommend continued PT while in the hospital to address strengthening, activity tolerance, balance for improvement of functional mobility skills with transition to SNF at discharge.  No DME recommendations at this time.     Follow Up Recommendations SNF    Equipment Recommendations  Other (comment)  (Will defer to SNF)    Recommendations for Other Services OT consult     Precautions / Restrictions Precautions Precautions: Fall Restrictions Weight Bearing Restrictions: No      Mobility  Bed Mobility Overal bed mobility: Needs Assistance Bed Mobility: Supine to Sit;Sit to Supine     Supine to sit: Min assist;Mod assist (Min assist for trunk, mod assist to scoot to EOB) Sit to supine: Mod assist (for LE)      Transfers Overall transfer level: Needs assistance Equipment used: Rolling walker (2 wheeled) Transfers: Sit to/from Stand Sit to Stand: Mod assist            Ambulation/Gait             General Gait Details: Unable to assess.       Balance Overall balance assessment: Needs assistance Sitting-balance support: Feet unsupported;Bilateral upper extremity supported Sitting balance-Leahy Scale: Fair Sitting balance - Comments: Pt required close SBA - mod assist to maintain sitting at EOB secondary to tendency to lean forward/unable to self correct  Postural control: Other (comment) (Anterior lean) Standing balance support: Bilateral upper extremity supported;During functional activity Standing balance-Leahy Scale: Poor                               Pertinent Vitals/Pain No pain reported.     Home Living Family/patient expects to be discharged to:: Skilled nursing facility  Living Arrangements: Children               Additional Comments: Pt lives in a single story home with her son with one small step to enter.  DME : RW, tub shower    Prior Function Level of Independence: Independent                  Extremity/Trunk Assessment               Lower Extremity Assessment: Generalized weakness         Communication      Cognition Arousal/Alertness: Awake/alert Behavior During Therapy: WFL for tasks assessed/performed Overall Cognitive Status: Impaired/Different from baseline                                Assessment/Plan    PT Assessment Patient needs continued PT services  PT Diagnosis Difficulty walking;Generalized weakness   PT Problem List Decreased strength;Decreased activity tolerance;Decreased knowledge of use of DME;Decreased balance;Decreased mobility  PT Treatment Interventions Balance training;Gait training;Neuromuscular re-education;Functional mobility training;Therapeutic activities;Therapeutic exercise;Patient/family education   PT Goals (Current goals can be found in the Care Plan section) Acute Rehab PT Goals Patient Stated Goal: none stated PT Goal Formulation: With patient/family    Frequency Min 3X/week   Barriers to discharge Inaccessible home environment;Decreased caregiver support      Co-evaluation               End of Session Equipment Utilized During Treatment: Gait belt Activity Tolerance: Patient limited by fatigue Patient left: in bed;with call bell/phone within reach;with bed alarm set;with family/visitor present           Time: 1515-1540 PT Time Calculation (min): 25 min   Charges:   PT Evaluation $Initial PT Evaluation Tier I: 1 Procedure          Jamien Casanova 09/15/2013, 4:34 PM

## 2013-09-15 NOTE — Progress Notes (Signed)
Pt has had multiple episodes of incontinence. Pt has had 7 periods of wetting the bed and at this time still has a residual of >341 mL or more of urine in her bladder by bladder scan. Paged Dr Darrick Meigs for consideration on foley Order for Foley catheter received and foley inserted by protocol. Immediate return of >421mL of yellow clear urine.  Pt has been cleaned, bathed and dried at this time.

## 2013-09-16 DIAGNOSIS — R509 Fever, unspecified: Secondary | ICD-10-CM

## 2013-09-16 DIAGNOSIS — E440 Moderate protein-calorie malnutrition: Secondary | ICD-10-CM

## 2013-09-16 DIAGNOSIS — E872 Acidosis, unspecified: Secondary | ICD-10-CM

## 2013-09-16 DIAGNOSIS — J690 Pneumonitis due to inhalation of food and vomit: Secondary | ICD-10-CM

## 2013-09-16 LAB — GLUCOSE, CAPILLARY
GLUCOSE-CAPILLARY: 149 mg/dL — AB (ref 70–99)
Glucose-Capillary: 131 mg/dL — ABNORMAL HIGH (ref 70–99)
Glucose-Capillary: 170 mg/dL — ABNORMAL HIGH (ref 70–99)
Glucose-Capillary: 88 mg/dL (ref 70–99)
Glucose-Capillary: 112 mg/dL — ABNORMAL HIGH (ref 70–99)
Glucose-Capillary: 102 mg/dL — ABNORMAL HIGH (ref 70–99)
Glucose-Capillary: 359 mg/dL — ABNORMAL HIGH (ref 70–99)
Glucose-Capillary: 210 mg/dL — ABNORMAL HIGH (ref 70–99)

## 2013-09-16 LAB — URINE CULTURE
COLONY COUNT: NO GROWTH
Culture: NO GROWTH
SPECIAL REQUESTS: NORMAL

## 2013-09-16 LAB — CLOSTRIDIUM DIFFICILE BY PCR: Toxigenic C. Difficile by PCR: NEGATIVE

## 2013-09-16 LAB — BASIC METABOLIC PANEL
Anion gap: 10 (ref 5–15)
BUN: 4 mg/dL — ABNORMAL LOW (ref 6–23)
CHLORIDE: 109 meq/L (ref 96–112)
CO2: 24 mEq/L (ref 19–32)
CREATININE: 0.57 mg/dL (ref 0.50–1.10)
Calcium: 8.9 mg/dL (ref 8.4–10.5)
GFR calc non Af Amer: >90 mL/min (ref >90)
Glucose, Bld: 196 mg/dL — ABNORMAL HIGH (ref 70–99)
POTASSIUM: 4.2 meq/L (ref 3.7–5.3)
SODIUM: 143 meq/L (ref 137–147)

## 2013-09-16 MED ORDER — PANTOPRAZOLE SODIUM 40 MG PO TBEC
40.0000 mg | DELAYED_RELEASE_TABLET | Freq: Every day | ORAL | Status: DC
  Administered 2013-09-16 – 2013-09-20 (×5): 40 mg via ORAL
  Filled 2013-09-16 (×5): qty 1

## 2013-09-16 MED ORDER — AMOXICILLIN-POT CLAVULANATE 875-125 MG PO TABS
1.0000 | ORAL_TABLET | Freq: Two times a day (BID) | ORAL | Status: DC
  Administered 2013-09-16 – 2013-09-20 (×9): 1 via ORAL
  Filled 2013-09-16 (×9): qty 1

## 2013-09-16 NOTE — Progress Notes (Signed)
The patient is receiving Protonix by the intravenous route.  Based on criteria approved by the Pharmacy and [facility], the medication is being converted to the equivalent oral dose form.  These criteria include: -No Active GI bleeding -Able to tolerate diet of full liquids (or better) or tube feeding OR able to tolerate other medications by the oral or enteral route  If you have any questions about this conversion, please contact the Pharmacy Department (ext [phone]).  Thank you.  Pricilla Larsson, [facility] 09/16/2013 12:53 PM

## 2013-09-16 NOTE — Progress Notes (Signed)
Patient had two loose stools last night per night shift and another loose stool this morning.  Specimen obtained for CDiff per protocol.  Patient placed on enteric precautions.

## 2013-09-16 NOTE — Progress Notes (Signed)
[facility] PROGRESS NOTE Interim History: 58 y/o ?, h/o poorly controlled IDDM for 20 yrs, multiple prior [4-6] admits DKA , Etoh abuse, Prior h/o NSTEMI/Demand ischemia 9/14, Son on telephone noted sugar had "bottomed out" to a blood glucose of 10 .He last saw her wnl ~35 min the EMS truck came . She apparently was posturing and had R gaze , given narcan-no effect. Not been drinking recently or anything along those lines-he states her normal sugars are 200's.  Assessment/Plan: Acute Metabolic encephalopathy/  probable Seizures due to metabolic disorder/  Acute respiratory failure requiring reintubation: - most likely due to hypoglycemia. Intubated for airway protection and extubated. - cont keppra. -  SLP dys 2 diet. - appreciate pulmonary assistance.  Aspiration PNA: -  change to Augmentin on 8.1.2015. - Defervescing. UC 7.29 & BC on 7.28 & 7.3 negative till date. - c.dif negative.  Type II or unspecified type diabetes mellitus without mention of complication, uncontrolled/Hypoglycemia: - not on long acting insulin. - BG stable.  History of alcohol use: - apparently not using. D/c CIWA  Severe protein caloric malnutrition: - Diet in am if able to toelrate. - ensure TID  Hypertension: -lisinopril 0.5 daily on hold, metoprolol 50 twice a day on hold for now and hydralazine 10 milligrams IV every 6 when necessary      Code Status: full  Family Communication: discussed with multiple family members and updated in detail. Transfer out to telel in am if all stable  Disposition Plan: Inpatient-ordered PT/Ot input  Consultants:  none  Procedures:  CXR  BC and UC x2  Antibiotics:  unasyn 7.31  HPI/Subjective: Improved from yesterday. No complains  Objective: Filed Vitals:   09/15/13 1400 09/15/13 2109 09/16/13 0448 09/16/13 0913  BP: 137/99 126/62 127/68 127/63  Pulse: 86 94 91 64  Temp:  98.5 F (36.9 C) 98.3 F (36.8 C)   TempSrc:  Oral Axillary     Resp:  16 16   Height:      Weight:      SpO2: 100% 100% 100% 100%    Intake/Output Summary (Last 24 hours) at 09/16/13 1046 Last data filed at 09/16/13 0516  Gross per 24 hour  Intake   1815 ml  Output   1825 ml  Net    -10 ml   Filed Weights   09/11/13 2107 09/12/13 0200  Weight: 40.824 kg (90 lb) 40.82 kg (89 lb 15.9 oz)    Exam:  General:  in no acute distress.  HEENT: No bruits, no goiter.  Heart: Regular rate and rhythm, without murmurs, rubs, gallops.  Lungs: Good air movement, bilateral air movement.  Abdomen: Soft, nontender, nondistended, positive bowel sounds.  Neuro: MENTAL STATUS:  She opens her eyes to deep Painful stimuli and moves all 4 extremities vigorously and  follow commands.  Data Reviewed: Basic Metabolic Panel:  Recent Labs Lab 09/12/13 0446 09/13/13 0454 09/14/13 0407 09/15/13 0424 09/16/13 0538  NA 134* 140 145 144 143  K 3.7 3.8 3.2* 3.4* 4.2  CL 101 106 111 104 109  CO2 19 20 22 23 24   GLUCOSE 219* 350* 180* 120* 196*  BUN 11 8 6  4* 4*  CREATININE 0.73 0.80 0.68 0.58 0.57  CALCIUM 8.9 8.2* 8.3* 8.8 8.9   Liver Function Tests:  Recent Labs Lab 09/11/13 2122 09/12/13 0446 09/13/13 0454 09/15/13 0424  AST 26 25 16 22   ALT 14 13 9 11   ALKPHOS 75 75 59 59  BILITOT 0.3 0.6  0.4 0.8  PROT 6.8 6.4 5.6* 6.4  ALBUMIN 3.6 3.5 2.7* 3.1*   No results found for this basename: LIPASE, AMYLASE,  in the last 168 hours  Recent Labs Lab 09/11/13 2123  AMMONIA 49   CBC:  Recent Labs Lab 09/11/13 2122 09/12/13 0446 09/13/13 0454 09/14/13 0407 09/15/13 0424  WBC 7.5 9.6 8.8 5.9 5.9  NEUTROABS 4.9  --   --  3.7 3.2  HGB 10.8* 11.2* 9.8* 9.3* 9.4*  HCT 31.5* 33.2* 28.7* 27.3* 27.8*  MCV 89.2 88.3 87.8 87.5 87.7  PLT 213 184 154 124* 139*   Cardiac Enzymes:  Recent Labs Lab 09/11/13 2122 09/12/13 0044 09/12/13 0703 09/12/13 1254  TROPONINI <0.30 <0.30 <0.30 <0.30   BNP (last 3 results)  Recent Labs  09/24/12 1958   PROBNP 38.8   CBG:  Recent Labs Lab 09/15/13 2114 09/16/13 0133 09/16/13 0456 09/16/13 0614 09/16/13 0740  GLUCAP 256* 131* 170* 149* 88    Recent Results (from the past 240 hour(s))  MRSA PCR SCREENING     Status: None   Collection Time    09/12/13 12:15 AM      Result Value Ref Range Status   MRSA by PCR NEGATIVE  NEGATIVE Final   Comment:            The GeneXpert MRSA Assay (FDA     approved for NASAL specimens     only), is one component of a     comprehensive MRSA colonization     surveillance program. It is not     intended to diagnose MRSA     infection nor to guide or     monitor treatment for     MRSA infections.  CULTURE, BLOOD (ROUTINE X 2)     Status: None   Collection Time    09/12/13 12:54 PM      Result Value Ref Range Status   Specimen Description BLOOD PICC LINE DRAWN BY RN LS   Final   Special Requests     Final   Value: BOTTLES DRAWN AEROBIC AND ANAEROBIC AEB=8CC ANA=6CC   Culture NO GROWTH 4 DAYS   Final   Report Status PENDING   Incomplete  CULTURE, BLOOD (ROUTINE X 2)     Status: None   Collection Time    09/12/13  1:00 PM      Result Value Ref Range Status   Specimen Description BLOOD LEFT FOOT   Final   Special Requests BOTTLES DRAWN AEROBIC ONLY 4CC   Final   Culture NO GROWTH 4 DAYS   Final   Report Status PENDING   Incomplete  URINE CULTURE     Status: None   Collection Time    09/13/13  5:55 PM      Result Value Ref Range Status   Specimen Description URINE, CATHETERIZED   Final   Special Requests NONE   Final   Culture  Setup Time     Final   Value: 09/13/2013 23:34     Performed at [facility]     Final   Value: 40,000 COLONIES/ML     Performed at [facility]   Culture     Final   Value: [place]     Performed at [facility]   Report Status PENDING   Incomplete  URINE CULTURE     Status: None   Collection Time    09/14/13  3:58 PM  Result Value Ref Range Status    Specimen Description URINE, CATHETERIZED   Final   Special Requests Normal   Final   Culture  Setup Time     Final   Value: 09/15/2013 03:28     Performed at [facility]     Final   Value: NO GROWTH     Performed at [facility]   Culture     Final   Value: NO GROWTH     Performed at [facility]   Report Status 09/16/2013 FINAL   Final  CULTURE, BLOOD (ROUTINE X 2)     Status: None   Collection Time    09/14/13  4:11 PM      Result Value Ref Range Status   Specimen Description BLOOD LEFT WRIST   Final   Special Requests BOTTLES DRAWN AEROBIC AND ANAEROBIC 6CC   Final   Culture NO GROWTH 2 DAYS   Final   Report Status PENDING   Incomplete  CULTURE, BLOOD (ROUTINE X 2)     Status: None   Collection Time    09/14/13  4:16 PM      Result Value Ref Range Status   Specimen Description BLOOD PICLINE   Final   Special Requests BOTTLES DRAWN AEROBIC AND ANAEROBIC 6CC   Final   Culture NO GROWTH 2 DAYS   Final   Report Status PENDING   Incomplete  CLOSTRIDIUM DIFFICILE BY PCR     Status: None   Collection Time    09/16/13  9:39 AM      Result Value Ref Range Status   C difficile by pcr NEGATIVE  NEGATIVE Final     Studies: No results found.  Scheduled Meds: . ALPRAZolam  0.5 mg Oral QID  . amoxicillin-clavulanate  1 tablet Oral Q12H  . antiseptic oral rinse  15 mL Mouth Rinse QID  . chlorhexidine  15 mL Mouth Rinse BID  . enoxaparin (LOVENOX) injection  30 mg Subcutaneous Q24H  . folic acid  1 mg Oral Daily  . insulin aspart  0-9 Units Subcutaneous Q4H  . levETIRAcetam  1,000 mg Intravenous Q12H  . lisinopril  10 mg Oral Daily  . metoCLOPramide  5 mg Oral QID  . mirtazapine  15 mg Oral QHS  . multivitamin with minerals  1 tablet Oral Daily  . pantoprazole (PROTONIX) IV  40 mg Intravenous Q24H  . PARoxetine  20 mg Oral Daily  . potassium chloride  40 mEq Oral Daily  . thiamine  100 mg Oral Daily   Or  . thiamine  100 mg Intravenous  Daily  . tobramycin  1 drop Both Eyes 4 times per day   Continuous Infusions: . sodium chloride 75 mL/hr at 09/16/13 0513     Charlynne Cousins  [facility] Pager [phone]. If 8PM-8AM, please contact night-coverage at [email or web address], password [facility] 09/16/2013, 10:46 AM  LOS: 5 days      **Disclaimer: This note may have been dictated with voice recognition software. Similar sounding words can inadvertently be transcribed and this note may contain transcription errors which may not have been corrected upon publication of note.**

## 2013-09-16 NOTE — Progress Notes (Signed)
She has resolved her episode of acute respiratory failure so I will plan to sign off at this point. Thanks for allowing me to see her with you. I will be happy to be involved again if needed

## 2013-09-17 DIAGNOSIS — E43 Unspecified severe protein-calorie malnutrition: Secondary | ICD-10-CM

## 2013-09-17 LAB — BASIC METABOLIC PANEL
ANION GAP: 10 (ref 5–15)
BUN: 5 mg/dL — ABNORMAL LOW (ref 6–23)
CO2: 24 meq/L (ref 19–32)
Calcium: 8.8 mg/dL (ref 8.4–10.5)
Chloride: 112 mEq/L (ref 96–112)
Creatinine, Ser: 0.63 mg/dL (ref 0.50–1.10)
GFR calc Af Amer: >90 mL/min (ref >90)
GFR calc non Af Amer: >90 mL/min (ref >90)
Glucose, Bld: 139 mg/dL — ABNORMAL HIGH (ref 70–99)
Potassium: 4.2 mEq/L (ref 3.7–5.3)
Sodium: 146 mEq/L (ref 137–147)

## 2013-09-17 LAB — CULTURE, BLOOD (ROUTINE X 2)
CULTURE: NO GROWTH
Culture: NO GROWTH

## 2013-09-17 LAB — GLUCOSE, CAPILLARY
GLUCOSE-CAPILLARY: 199 mg/dL — AB (ref 70–99)
Glucose-Capillary: 124 mg/dL — ABNORMAL HIGH (ref 70–99)
Glucose-Capillary: 174 mg/dL — ABNORMAL HIGH (ref 70–99)
Glucose-Capillary: 211 mg/dL — ABNORMAL HIGH (ref 70–99)
Glucose-Capillary: 278 mg/dL — ABNORMAL HIGH (ref 70–99)
Glucose-Capillary: 288 mg/dL — ABNORMAL HIGH (ref 70–99)

## 2013-09-17 LAB — TRIGLYCERIDES: Triglycerides: 82 mg/dL (ref <150)

## 2013-09-17 MED ORDER — ALPRAZOLAM 0.5 MG PO TABS
0.5000 mg | ORAL_TABLET | Freq: Two times a day (BID) | ORAL | Status: DC
  Administered 2013-09-17 – 2013-09-18 (×2): 0.5 mg via ORAL
  Filled 2013-09-17 (×2): qty 1

## 2013-09-17 MED ORDER — INSULIN GLARGINE 100 UNIT/ML ~~LOC~~ SOLN
5.0000 [IU] | Freq: Every day | SUBCUTANEOUS | Status: DC
  Administered 2013-09-17: 5 [IU] via SUBCUTANEOUS
  Filled 2013-09-17 (×2): qty 0.05

## 2013-09-17 MED ORDER — INSULIN ASPART 100 UNIT/ML ~~LOC~~ SOLN
3.0000 [IU] | Freq: Three times a day (TID) | SUBCUTANEOUS | Status: DC
  Administered 2013-09-17 – 2013-09-20 (×9): 3 [IU] via SUBCUTANEOUS

## 2013-09-17 MED ORDER — LEVETIRACETAM 500 MG PO TABS
1000.0000 mg | ORAL_TABLET | Freq: Two times a day (BID) | ORAL | Status: DC
  Administered 2013-09-17 – 2013-09-20 (×6): 1000 mg via ORAL
  Filled 2013-09-17 (×6): qty 2

## 2013-09-17 MED ORDER — INSULIN ASPART 100 UNIT/ML ~~LOC~~ SOLN
0.0000 [IU] | Freq: Three times a day (TID) | SUBCUTANEOUS | Status: DC
  Administered 2013-09-17 (×2): 5 [IU] via SUBCUTANEOUS
  Administered 2013-09-18: 3 [IU] via SUBCUTANEOUS
  Administered 2013-09-18: 7 [IU] via SUBCUTANEOUS
  Administered 2013-09-19 (×2): 2 [IU] via SUBCUTANEOUS

## 2013-09-17 MED ORDER — INSULIN ASPART 100 UNIT/ML ~~LOC~~ SOLN
0.0000 [IU] | Freq: Every day | SUBCUTANEOUS | Status: DC
  Administered 2013-09-18: 4 [IU] via SUBCUTANEOUS
  Administered 2013-09-19: 2 [IU] via SUBCUTANEOUS

## 2013-09-17 NOTE — Progress Notes (Signed)
[facility] PROGRESS NOTE Interim History: 59 y/o ?, h/o poorly controlled IDDM for 20 yrs, multiple prior [4-6] admits DKA , Etoh abuse, Prior h/o NSTEMI/Demand ischemia 9/14, Son on telephone noted sugar had "bottomed out" to a blood glucose of 10 .He last saw her wnl ~35 min the EMS truck came . She apparently was posturing and had R gaze , given narcan-no effect. Not been drinking recently or anything along those lines-he states her normal sugars are 200's.  Assessment/Plan: Acute Metabolic encephalopathy/  probable Seizures due to metabolic disorder/  Acute respiratory failure requiring reintubation: - most likely due to hypoglycemia. Intubated for airway protection and extubated. - cont keppra.  SLP dys 2 diet. - appreciate pulmonary assistance. Pt does have some cognitive impairment after after this episode of hypoglycemia. - stable to be transfer back to SNF in am.  Aspiration PNA: - Change to Augmentin on 8.1.2015. - Defervescing. UC 7.29 & BC on 7.28 & 7.3 negative till date. - c.dif negative.  Type II or unspecified type diabetes mellitus without mention of complication, uncontrolled/Hypoglycemia: - held long acting on admission, BG became > 250 restarted long acting insulin. - restart SSI sensitive.  History of alcohol use: - apparently not using. D/c CIWA  Severe protein caloric malnutrition: - Diet in am if able to toelrate. - ensure TID  Hypertension: -lisinopril 0.5 daily on hold, metoprolol 50 twice a day on hold for now and hydralazine 10 milligrams IV every 6 when necessary      Code Status: full  Family Communication: discussed with multiple family members and updated in detail. Transfer out to telel in am if all stable  Disposition Plan: SNF in 24hrs  Consultants:  none  Procedures:  CXR  BC and UC x2  Antibiotics:  unasyn 7.31  HPI/Subjective: No complains  Objective: Filed Vitals:   09/16/13 0913 09/16/13 1450 09/16/13 2027  09/17/13 0536  BP: 127/63 140/68 117/63 131/68  Pulse: 64 90 94 86  Temp:  99 F (37.2 C) 98.9 F (37.2 C) 98.8 F (37.1 C)  TempSrc:  Oral Oral Oral  Resp:   20 20  Height:      Weight:      SpO2: 100% 100% 100% 100%    Intake/Output Summary (Last 24 hours) at 09/17/13 1109 Last data filed at 09/17/13 0906  Gross per 24 hour  Intake   2230 ml  Output   2100 ml  Net    130 ml   Filed Weights   09/11/13 2107 09/12/13 0200  Weight: 40.824 kg (90 lb) 40.82 kg (89 lb 15.9 oz)    Exam:  General:  in no acute distress.  HEENT: No bruits, no goiter.  Heart: Regular rate and rhythm, without murmurs, rubs, gallops.  Lungs: Good air movement, bilateral air movement.  Abdomen: Soft, nontender, nondistended, positive bowel sounds.  Neuro: MENTAL STATUS:  She opens her eyes to deep Painful stimuli and moves all 4 extremities vigorously and  follow commands.  Data Reviewed: Basic Metabolic Panel:  Recent Labs Lab 09/13/13 0454 09/14/13 0407 09/15/13 0424 09/16/13 0538 09/17/13 0654  NA 140 145 144 143 146  K 3.8 3.2* 3.4* 4.2 4.2  CL 106 111 104 109 112  CO2 20 22 23 24 24   GLUCOSE 350* 180* 120* 196* 139*  BUN 8 6 4* 4* 5*  CREATININE 0.80 0.68 0.58 0.57 0.63  CALCIUM 8.2* 8.3* 8.8 8.9 8.8   Liver Function Tests:  Recent Labs Lab 09/11/13 2122  09/12/13 0446 09/13/13 0454 09/15/13 0424  AST 26 25 16 22   ALT 14 13 9 11   ALKPHOS 75 75 59 59  BILITOT 0.3 0.6 0.4 0.8  PROT 6.8 6.4 5.6* 6.4  ALBUMIN 3.6 3.5 2.7* 3.1*   No results found for this basename: LIPASE, AMYLASE,  in the last 168 hours  Recent Labs Lab 09/11/13 2123  AMMONIA 49   CBC:  Recent Labs Lab 09/11/13 2122 09/12/13 0446 09/13/13 0454 09/14/13 0407 09/15/13 0424  WBC 7.5 9.6 8.8 5.9 5.9  NEUTROABS 4.9  --   --  3.7 3.2  HGB 10.8* 11.2* 9.8* 9.3* 9.4*  HCT 31.5* 33.2* 28.7* 27.3* 27.8*  MCV 89.2 88.3 87.8 87.5 87.7  PLT 213 184 154 124* 139*   Cardiac Enzymes:  Recent Labs Lab  09/11/13 2122 09/12/13 0044 09/12/13 0703 09/12/13 1254  TROPONINI <0.30 <0.30 <0.30 <0.30   BNP (last 3 results)  Recent Labs  09/24/12 1958  PROBNP 38.8   CBG:  Recent Labs Lab 09/16/13 2050 09/17/13 0036 09/17/13 0459 09/17/13 0738 09/17/13 1104  GLUCAP 210* 211* 174* 124* 288*    Recent Results (from the past 240 hour(s))  MRSA PCR SCREENING     Status: None   Collection Time    09/12/13 12:15 AM      Result Value Ref Range Status   MRSA by PCR NEGATIVE  NEGATIVE Final   Comment:            The GeneXpert MRSA Assay (FDA     approved for NASAL specimens     only), is one component of a     comprehensive MRSA colonization     surveillance program. It is not     intended to diagnose MRSA     infection nor to guide or     monitor treatment for     MRSA infections.  CULTURE, BLOOD (ROUTINE X 2)     Status: None   Collection Time    09/12/13 12:54 PM      Result Value Ref Range Status   Specimen Description BLOOD PICC LINE DRAWN BY RN LS   Final   Special Requests     Final   Value: BOTTLES DRAWN AEROBIC AND ANAEROBIC AEB=8CC ANA=6CC   Culture NO GROWTH 5 DAYS   Final   Report Status 09/17/2013 FINAL   Final  CULTURE, BLOOD (ROUTINE X 2)     Status: None   Collection Time    09/12/13  1:00 PM      Result Value Ref Range Status   Specimen Description BLOOD LEFT FOOT   Final   Special Requests BOTTLES DRAWN AEROBIC ONLY 4CC   Final   Culture NO GROWTH 5 DAYS   Final   Report Status 09/17/2013 FINAL   Final  URINE CULTURE     Status: None   Collection Time    09/13/13  5:55 PM      Result Value Ref Range Status   Specimen Description URINE, CATHETERIZED   Final   Special Requests NONE   Final   Culture  Setup Time     Final   Value: 09/13/2013 23:34     Performed at [place]     Final   Value: 40,000 COLONIES/ML     Performed at [facility]   Culture     Final   Value: KLEBSIELLA PNEUMONIAE     Performed at  [facility]  Report Status 09/16/2013 FINAL   Final   Organism ID, Bacteria KLEBSIELLA PNEUMONIAE   Final  URINE CULTURE     Status: None   Collection Time    09/14/13  3:58 PM      Result Value Ref Range Status   Specimen Description URINE, CATHETERIZED   Final   Special Requests Normal   Final   Culture  Setup Time     Final   Value: 09/15/2013 03:28     Performed at [facility]     Final   Value: NO GROWTH     Performed at [facility]   Culture     Final   Value: NO GROWTH     Performed at [facility]   Report Status 09/16/2013 FINAL   Final  CULTURE, BLOOD (ROUTINE X 2)     Status: None   Collection Time    09/14/13  4:11 PM      Result Value Ref Range Status   Specimen Description BLOOD LEFT WRIST   Final   Special Requests BOTTLES DRAWN AEROBIC AND ANAEROBIC 6CC   Final   Culture NO GROWTH 3 DAYS   Final   Report Status PENDING   Incomplete  CULTURE, BLOOD (ROUTINE X 2)     Status: None   Collection Time    09/14/13  4:16 PM      Result Value Ref Range Status   Specimen Description BLOOD PICLINE   Final   Special Requests BOTTLES DRAWN AEROBIC AND ANAEROBIC 6CC   Final   Culture NO GROWTH 3 DAYS   Final   Report Status PENDING   Incomplete  CLOSTRIDIUM DIFFICILE BY PCR     Status: None   Collection Time    09/16/13  9:39 AM      Result Value Ref Range Status   C difficile by pcr NEGATIVE  NEGATIVE Final     Studies: No results found.  Scheduled Meds: . ALPRAZolam  0.5 mg Oral QID  . amoxicillin-clavulanate  1 tablet Oral Q12H  . antiseptic oral rinse  15 mL Mouth Rinse QID  . chlorhexidine  15 mL Mouth Rinse BID  . enoxaparin (LOVENOX) injection  30 mg Subcutaneous Q24H  . folic acid  1 mg Oral Daily  . insulin aspart  0-9 Units Subcutaneous Q4H  . levETIRAcetam  1,000 mg Intravenous Q12H  . lisinopril  10 mg Oral Daily  . metoCLOPramide  5 mg Oral QID  . mirtazapine  15 mg Oral QHS  . multivitamin with  minerals  1 tablet Oral Daily  . pantoprazole  40 mg Oral Daily  . PARoxetine  20 mg Oral Daily  . potassium chloride  40 mEq Oral Daily  . thiamine  100 mg Oral Daily   Or  . thiamine  100 mg Intravenous Daily  . tobramycin  1 drop Both Eyes 4 times per day   Continuous Infusions: . sodium chloride 75 mL/hr at 09/17/13 0911     Charlynne Cousins  [facility] Pager [phone]. If 8PM-8AM, please contact night-coverage at [email or web address], password [facility] 09/17/2013, 11:09 AM  LOS: 6 days      **Disclaimer: This note may have been dictated with voice recognition software. Similar sounding words can inadvertently be transcribed and this note may contain transcription errors which may not have been corrected upon publication of note.**

## 2013-09-18 LAB — GLUCOSE, CAPILLARY
GLUCOSE-CAPILLARY: 205 mg/dL — AB (ref 70–99)
GLUCOSE-CAPILLARY: 324 mg/dL — AB (ref 70–99)
Glucose-Capillary: 332 mg/dL — ABNORMAL HIGH (ref 70–99)
Glucose-Capillary: 119 mg/dL — ABNORMAL HIGH (ref 70–99)

## 2013-09-18 LAB — BASIC METABOLIC PANEL
ANION GAP: 9 (ref 5–15)
BUN: 3 mg/dL — ABNORMAL LOW (ref 6–23)
CALCIUM: 9.1 mg/dL (ref 8.4–10.5)
CHLORIDE: 108 meq/L (ref 96–112)
CO2: 26 meq/L (ref 19–32)
Creatinine, Ser: 0.63 mg/dL (ref 0.50–1.10)
GFR calc non Af Amer: >90 mL/min (ref >90)
Glucose, Bld: 204 mg/dL — ABNORMAL HIGH (ref 70–99)
Potassium: 4.4 mEq/L (ref 3.7–5.3)
SODIUM: 143 meq/L (ref 137–147)

## 2013-09-18 MED ORDER — INSULIN GLARGINE 100 UNIT/ML ~~LOC~~ SOLN
10.0000 [IU] | Freq: Every day | SUBCUTANEOUS | Status: DC
  Administered 2013-09-18 – 2013-09-19 (×2): 10 [IU] via SUBCUTANEOUS
  Filled 2013-09-18 (×3): qty 0.1

## 2013-09-18 MED ORDER — METOPROLOL TARTRATE 50 MG PO TABS
50.0000 mg | ORAL_TABLET | Freq: Two times a day (BID) | ORAL | Status: DC
  Administered 2013-09-18 – 2013-09-20 (×5): 50 mg via ORAL
  Filled 2013-09-18 (×5): qty 1

## 2013-09-18 MED ORDER — LORAZEPAM 2 MG/ML IJ SOLN: 0.5000 mg | Freq: Two times a day (BID) | INTRAMUSCULAR | Status: DC | PRN

## 2013-09-18 NOTE — Progress Notes (Signed)
Physical Therapy Treatment Patient Details Name: Erica [name] MRN: [id number] DOB: 1955-05-29 Today's Date: 09/18/2013    History of Present Illness 58 yo female iddm found down by her family tonight who called ems.  ems found pt her glucose was 10.  On arrival to ED after given an amp of D50, glucose was up to 300 (per ed records).  Pt was posturing on arrival with fixed rightward gaze, but no overt seizure activity.  Pt was given narcan with no response.  She was intubated to protect her airway, she had good vital signs.  Soon after intubation, she started becoming more responsive, moving all of her extemities and fighting the vent at which point propofol gtt was initiated.  No family in ED, or icu.  Pt ED w/u reveals a normal ct head with no evidence of acute abnormality, normal electolytes and renal function.  Neg trop, neg cxr and ua.  uds pos for benzos.  etoh level neg.  hgb 10.8.  Wbc normal.  Glucose repeatedly rises some then dropping, has received 3 amps d50 and now on d51/2ns gtt.  Pt is moderately sedated on ventilator.    PT Comments    Per case manager, family requested to be present during PT treatment and PT informed family that therapy would be around 1500 this afternoon.  No family present during treatment; case manager attempted to call number on file however phone was busy.  Pt demonstrates great improvements today, in appropriate answers to questions, increased verbalizations today, and ability to perform functional mobility skills.  Pt up in chair prior to PT treatment; per NT pt was able to transfer from bed to [facility] with assist x1 for balance and safety.  Pt transferred sit <-> stand and stand pivot transfer with min guard assist today.  Noted improved skills as pt was able to amb ~50 feet today with min assist and use of RW.  Pt veering to the Lt during gait, requiring VC and min assist to correct placement of RW;  Pt unable to recognize veering and self correct before she  rolled RW into wall in hallway (open environment).   Pt required seated rest break after 50 feet secondary to muscle fatigue/generalized weakness.  Recommendation appropriate for SNF placement to address strengthening, balance, and activity tolerance for improved functional mobility skills.    Follow Up Recommendations  SNF     Equipment Recommendations  Rolling walker with 5" wheels;Other (comment) (RW if pt is to return home, if not will defer to SNF)           Mobility  Bed Mobility               General bed mobility comments: Not assessed as pt up in chair prior to PT treatment.  Per NT pt required minimal assistance/min guard for safety and balance.   Transfers Overall transfer level: Needs assistance Equipment used: Rolling walker (2 wheeled) Transfers: Sit to/from [facility] Sit to Stand: Min guard Stand pivot transfers: Min guard       General transfer comment: VC for technique for stand pivot transfer, as pt attempted to sit down before she was completely turned to sit down safety.   Ambulation/Gait Ambulation/Gait assistance: Min guard;Min assist Ambulation Distance (Feet): 50 Feet Assistive device: Rolling walker (2 wheeled) Gait Pattern/deviations: Step-to pattern;Drifts right/left;Decreased step length - right;Decreased step length - left   Gait velocity interpretation: Below normal speed for age/gender General Gait Details: Pt veering Lt during gait,  requiring VC and min assist to correct position of RW.  Pt did not realize she was going to run into wall to the Lt without VC.       Balance Overall balance assessment: Needs assistance Sitting-balance support: Feet supported;Bilateral upper extremity supported Sitting balance-Leahy Scale: Good Sitting balance - Comments: Pt able to maintain balance at edge of chair with (B) UE assist   Standing balance support: During functional activity;Bilateral upper extremity supported Standing  balance-Leahy Scale: Fair Standing balance comment: Fair standing balance in static standing, though poor during gait as pt veers to the [employer] Arousal/Alertness: Awake/alert Behavior During Therapy: WFL for tasks assessed/performed Overall Cognitive Status: No family/caregiver present to determine baseline cognitive functioning                             Pertinent Vitals/Pain No pain reported.            PT Goals (current goals can now be found in the care plan section) Progress towards PT goals: Progressing toward goals    Frequency  Min 3X/week    PT Plan Current plan remains appropriate       End of Session Equipment Utilized During Treatment: Gait belt Activity Tolerance: Patient limited by fatigue Patient left: in chair;with call bell/phone within reach;with chair alarm set     Time: 1525-1540 PT Time Calculation (min): 15 min  Charges:  $Gait Training: 8-22 mins                     Cobin Cadavid 09/18/2013, 3:52 PM

## 2013-09-18 NOTE — Progress Notes (Signed)
Patient ID: [name], female   DOB: 03-16-1955, 58 y.o.   MRN: [id number] [facility] PROGRESS NOTE  MITZE DESHMUKH [id number] DOB: 19-Feb-1955 DOA: 09/11/2013 PCP: Octavio Graves, DO  Brief narrative: 58 year old female with history of alcohol abuse, diabetes, hypertension, prior history of NSTEMI who presented to [facility] 09/11/2013 with altered mental status, low blood sugar (per family member blood glucose was 10 at home). On admission, patient had a seizure and has required intubation for airway protection. She was found to have normal alcohol level on the admission, urine drug screen was positive for benzos. She was started on Unasyn for treatment of possible aspiration pneumonia.  Assessment/Plan:   Principal problem: Acute Metabolic encephalopathy/ Seizures due to metabolic disorder/ Acute respiratory failure requiring reintubation:   Likely secondary to hypoglycemia. Alcohol level and ammonia level was within normal limits on the admission.   Patient was intubated for air or protection on admission and subsequently extubated.  No further seizure episodes reported.  Patient is being treated for possible aspiration pneumonia. Swallow evaluation is ongoing. Current diet is dysphagia 2.  Needs physical therapy evaluation for safe discharge plan.  Active problems: Probable aspiration pneumonia  Patient was started on Unasyn on the admission. Unasyn stopped 09/16/2013 and patient transition to Augmentin  Blood cultures to date are negative  Seizures  Continue Keppra 1,000 milligrams by mouth twice a day Type II diabetes mellitus without mention of complication, uncontrolled  No recent A1c on file. Obtain A1c for this admission.  Appreciate diabetic coordinator input.  Continue Lantus 10 units at bedtime, NovoLog 3 units 3 times a day with meal and sliding scale insulin History of alcohol use  Alcohol level within normal limits on this admission. Ammonia  level within normal limits.  Continue folic acid, multivitamin and thiamine Severe protein caloric malnutrition:   Likely in the setting of chronic illness. Diet is dysphagia 2. Ongoing swallow evaluation. Hypertension  Continue lisinopril 10 mg daily, metoprolol 50 mg twice daily. Anxiety and depression  Continue Paxil. Family preferred that patient does not get Xanax. They were in agreement with Ativan 0.5 mg IV every 12 hours only as needed.  DVT prophylaxis: Lovenox sub Q while pt is in hospital   Consultants:  PCCM Neurology  Procedures:  Intubation on admission Central line placemat  Antibiotics:  Unasyn 09/15/2013 --> 09/16/2013  Augmentin 09/16/2013 -->  Code Status: full code  Family Communication: plan of care discussed with the patient and her family at the bedside  Disposition Plan: remains inpatient; needs PT eval   Leisa Lenz, MD  [facility] Pager [phone]  If 7PM-7AM, please contact night-coverage [email or web address] Password [facility] 09/18/2013, 12:37 PM   LOS: 7 days    HPI/Subjective: No acute overnight events.  Objective: Filed Vitals:   09/17/13 0536 09/17/13 1356 09/17/13 2024 09/18/13 0621  BP: 131/68 131/58 143/66 147/68  Pulse: 86 95 101 84  Temp: 98.8 F (37.1 C) 99.1 F (37.3 C) 98.4 F (36.9 C) 98 F (36.7 C)  TempSrc: Oral Oral Oral Oral  Resp: 20 20 20 20   Height:      Weight:      SpO2: 100% 100% 100% 100%    Intake/Output Summary (Last 24 hours) at 09/18/13 1237 Last data filed at 09/18/13 1030  Gross per 24 hour  Intake   2250 ml  Output   4950 ml  Net  -2700 ml    Exam:   General:  Pt is alert, follows commands  appropriately, not in acute distress  Cardiovascular: Regular rate and rhythm, S1/S2, SEM 2/6 in left mid sternal area  Respiratory: Clear to auscultation bilaterally, no wheezing  Abdomen: Soft, non tender, non distended, bowel sounds present  Extremities: No edema, pulses DP and PT palpable  bilaterally  Neuro: Grossly nonfocal  Data Reviewed: Basic Metabolic Panel:  Recent Labs Lab 09/14/13 0407 09/15/13 0424 09/16/13 0538 09/17/13 0654 09/18/13 0642  NA 145 144 143 146 143  K 3.2* 3.4* 4.2 4.2 4.4  CL 111 104 109 112 108  CO2 22 23 24 24 26   GLUCOSE 180* 120* 196* 139* 204*  BUN 6 4* 4* 5* 3*  CREATININE 0.68 0.58 0.57 0.63 0.63  CALCIUM 8.3* 8.8 8.9 8.8 9.1   Liver Function Tests:  Recent Labs Lab 09/11/13 2122 09/12/13 0446 09/13/13 0454 09/15/13 0424  AST 26 25 16 22   ALT 14 13 9 11   ALKPHOS 75 75 59 59  BILITOT 0.3 0.6 0.4 0.8  PROT 6.8 6.4 5.6* 6.4  ALBUMIN 3.6 3.5 2.7* 3.1*   No results found for this basename: LIPASE, AMYLASE,  in the last 168 hours  Recent Labs Lab 09/11/13 2123  AMMONIA 49   CBC:  Recent Labs Lab 09/11/13 2122 09/12/13 0446 09/13/13 0454 09/14/13 0407 09/15/13 0424  WBC 7.5 9.6 8.8 5.9 5.9  NEUTROABS 4.9  --   --  3.7 3.2  HGB 10.8* 11.2* 9.8* 9.3* 9.4*  HCT 31.5* 33.2* 28.7* 27.3* 27.8*  MCV 89.2 88.3 87.8 87.5 87.7  PLT 213 184 154 124* 139*   Cardiac Enzymes:  Recent Labs Lab 09/11/13 2122 09/12/13 0044 09/12/13 0703 09/12/13 1254  TROPONINI <0.30 <0.30 <0.30 <0.30   BNP: No components found with this basename: POCBNP,  CBG:  Recent Labs Lab 09/17/13 1104 09/17/13 1642 09/17/13 2028 09/18/13 0710 09/18/13 1114  GLUCAP 288* 278* 199* 205* 332*    MRSA PCR SCREENING     Status: None   Collection Time    09/12/13 12:15 AM      Result Value Ref Range Status   MRSA by PCR NEGATIVE  NEGATIVE Final  CULTURE, BLOOD (ROUTINE X 2)     Status: None   Collection Time    09/12/13 12:54 PM      Result Value Ref Range Status   Specimen Description BLOOD PICC LINE DRAWN BY RN LS   Final   Special Requests     Final   Value: BOTTLES DRAWN AEROBIC AND ANAEROBIC AEB=8CC ANA=6CC   Culture NO GROWTH 5 DAYS   Final   Report Status 09/17/2013 FINAL   Final  CULTURE, BLOOD (ROUTINE X 2)     Status:  None   Collection Time    09/12/13  1:00 PM      Result Value Ref Range Status   Specimen Description BLOOD LEFT FOOT   Final   Culture NO GROWTH 5 DAYS   Final   Report Status 09/17/2013 FINAL   Final  URINE CULTURE     Status: None   Collection Time    09/13/13  5:55 PM      Result Value Ref Range Status   Specimen Description URINE, CATHETERIZED   Final   Value: KLEBSIELLA PNEUMONIAE     Performed at [facility]   Report Status 09/16/2013 FINAL   Final   Organism ID, Bacteria KLEBSIELLA PNEUMONIAE   Final  URINE CULTURE     Status: None   Collection Time  09/14/13  3:58 PM      Result Value Ref Range Status   Specimen Description URINE, CATHETERIZED   Final   Value: NO GROWTH     Performed at [facility]   Report Status 09/16/2013 FINAL   Final  CULTURE, BLOOD (ROUTINE X 2)     Status: None   Collection Time    09/14/13  4:11 PM      Result Value Ref Range Status   Specimen Description BLOOD LEFT WRIST   Final   Culture NO GROWTH 4 DAYS   Final   Report Status PENDING   Incomplete  CULTURE, BLOOD (ROUTINE X 2)     Status: None   Collection Time    09/14/13  4:16 PM      Result Value Ref Range Status   Specimen Description BLOOD PICLINE   Final   Culture NO GROWTH 4 DAYS   Final   Report Status PENDING   Incomplete  CLOSTRIDIUM DIFFICILE BY PCR     Status: None   Collection Time    09/16/13  9:39 AM      Result Value Ref Range Status   C difficile by pcr NEGATIVE  NEGATIVE Final     Studies: No results found.  Scheduled Meds: . amoxicillin-clavulanate  1 tablet Oral Q12H  . enoxaparin (LOVENOX) injection  30 mg Subcutaneous Q24H  . folic acid  1 mg Oral Daily  . insulin aspart  0-5 Units Subcutaneous QHS  . insulin aspart  0-9 Units Subcutaneous TID WC  . insulin aspart  3 Units Subcutaneous TID WC  . insulin glargine  10 Units Subcutaneous QHS  . levETIRAcetam  1,000 mg Oral BID  . lisinopril  10 mg Oral Daily  . metoCLOPramide  5 mg  Oral QID  . mirtazapine  15 mg Oral QHS  . multivitamin with minerals  1 tablet Oral Daily  . pantoprazole  40 mg Oral Daily  . PARoxetine  20 mg Oral Daily  . potassium chloride  40 mEq Oral Daily  . thiamine  100 mg Oral Daily   Continuous Infusions: . sodium chloride 50 mL/hr at 09/18/13 1236

## 2013-09-18 NOTE — Clinical Social Work Psychosocial (Signed)
Clinical Social Work Department BRIEF PSYCHOSOCIAL ASSESSMENT 09/18/2013  Patient:  Erica [name], Erica [name]     Account Number:  [id number]     Admit date:  09/11/2013  Clinical Social Worker:  Erica [name]  Date/Time:  09/18/2013 02:11 PM  Referred by:  CSW  Date Referred:  09/18/2013 Referred for  SNF Placement   Other Referral:   Interview type:  Patient Other interview type:   daughterCarolin [name]    PSYCHOSOCIAL DATA Living Status:  FAMILY Admitted from facility:   Level of care:   Primary support name:  Erica [name] Primary support relationship to patient:  CHILD, ADULT Degree of support available:   supportive    CURRENT CONCERNS Current Concerns  Post-Acute Placement   Other Concerns:    SOCIAL WORK ASSESSMENT / PLAN CSW met with pt and pt's daughter, Erica [name] at bedside. Pt alert, but oriented to self only. Erica [name] reports pt lives with pt's son. However, Erica [name] is there from 9-3 Monday through Friday and 5 hours each day on the weekend as pt's CAP aid. Daughter reports pt is not left alone and when she is not there, son is. Erica [name] also goes back to pt's home at night to help with medications and other needs. She states that her son was with pt when she was lying on the couch and was not responsive. Erica [name] came and checked pt's blood sugar and it was 11. She immediately called EMS.  Erica [name] reports pt normally manages okay at home with family support. She generally walks independently, but occasionally uses a walker. Pt has poor balance at baseline. Erica [name] tried to get more CAP hours for pt after last hospitalization, but it was denied. She was evaluated by PT Friday afternoon and recommendation is for SNF. CSW attempted to begin conversation about SNF, but daughter requests to wait as she wants to see how pt does today with therapy. She reports pt was up over weekend and if able, they would like to take her home. PT aware of daughter's request to be present during treatment.    Assessment/plan status:  Psychosocial Support/Ongoing Assessment of Needs Other assessment/ plan:   Information/referral to community resources:   SNF list    PATIENT'S/FAMILY'S RESPONSE TO PLAN OF CARE: Awaiting physical therapy treatment today and will discuss d/c planning further with pt's daughter.       Erica [name], [place]

## 2013-09-18 NOTE — Progress Notes (Addendum)
Speech Language Pathology Treatment: Dysphagia  Patient Details Name: Erica [name] MRN: [id number] DOB: May 01, 1955 Today's Date: 09/18/2013 Time: 0950-1002 SLP Time Calculation (min): 12 min  Assessment / Plan / Recommendation Clinical Impression  Dysphagia treatment today for upgraded PO texture trials, compensatory strategy training, and patient/ family education. Significant improvements today noted in alertness and overall cognitive status from previous visit. Pt tolerated thin liquids and regular food texture consistency with no overt s/s of aspiration. Swallow appeared more timely today with adequate hyolaryngeal excursion. RN reported no overt difficulties with current diet. CXR 7/30 negative. Given these findings, current risk of aspiration is low. Recommend advancing diet to regular/ thin liquids, meds whole with liquid, provide intermittent supervision to cue pt to take small bites and sips, ensure that pt is sitting upright 90 degrees for all PO intake. Provided education on these precautions. Speech therapy will f/u x1 if pt is still in hospital to assess diet tolerance; however pt likely to be discharged today. No f/u necessary at next level of care. Please contact if pt demonstrates difficulties tolerating this diet.     HPI HPI: 58 y.o. female with PMH of diabetes and multiple prior admits DKA, admitted 7/27, found with glucose of 10 and nonresponsive, intubated upon admission, glucose was repeatedly rising and falling. Toxic metabolic encephalopathy with seizures. Head CT 7/27 unremarkable. EEG 7/30 indicated "no brain activity." Extubated 7/30 in PM (3-day intubation). As of 7/31 has been more reponsive but likely to still have some neurologic injury. Bedside swallow eval ordered to assess readiness for POs post-extubation.   Pertinent Vitals n/a  SLP Plan  All goals met    Recommendations Diet recommendations: Regular;Thin liquid Liquids provided via: Straw;Cup Medication  Administration: Whole meds with liquid Supervision: Patient able to self feed;Intermittent supervision to cue for compensatory strategies Compensations: Slow rate;Small sips/bites Postural Changes and/or Swallow Maneuvers: Seated upright 90 degrees;Upright 30-60 min after meal              Oral Care Recommendations: Oral care BID Follow up Recommendations: 24 hour supervision/assistance Plan: All goals met    GO     Kern Reap, MA, CCC-SLP 09/18/2013, 10:09 AM [phone]

## 2013-09-18 NOTE — Progress Notes (Signed)
Inpatient Diabetes Program Recommendations  AACE/ADA: New Consensus Statement on Inpatient Glycemic Control (2013)  Target Ranges:  Prepandial:   less than 140 mg/dL      Peak postprandial:   less than 180 mg/dL (1-2 hours)      Critically ill patients:  140 - 180 mg/dL   Results for Erica [name], Erica [name] (MRN [id number]) as of 09/18/2013 08:21  Ref. Range 09/17/2013 07:38 09/17/2013 11:04 09/17/2013 16:42 09/17/2013 20:28 09/18/2013 07:10  Glucose-Capillary Latest Range: 70-99 mg/dL 124 (H) 288 (H) 278 (H) 199 (H) 205 (H)   Current orders for Inpatient glycemic control: Lantus 5 units QHS, Novolog 0-9 units AC, Novolog 0-5 units HS, Novolog 3 units TID with meals   Inpatient Diabetes Program Recommendations Insulin - Basal: Please consider increasing Lantus to 10 units QHS.  Thanks, Barnie Alderman, RN, MSN, CCRN Diabetes Coordinator Inpatient Diabetes Program [phone] (Team Pager) [phone] ([facility] office) [phone] [facility] office)

## 2013-09-19 LAB — GLUCOSE, CAPILLARY
GLUCOSE-CAPILLARY: 168 mg/dL — AB (ref 70–99)
Glucose-Capillary: 200 mg/dL — ABNORMAL HIGH (ref 70–99)
Glucose-Capillary: 119 mg/dL — ABNORMAL HIGH (ref 70–99)
Glucose-Capillary: 250 mg/dL — ABNORMAL HIGH (ref 70–99)

## 2013-09-19 LAB — CULTURE, BLOOD (ROUTINE X 2)
CULTURE: NO GROWTH
CULTURE: NO GROWTH

## 2013-09-19 LAB — HEMOGLOBIN A1C
Hgb A1c MFr Bld: 10.6 % — ABNORMAL HIGH (ref <5.7)
Mean Plasma Glucose: 258 mg/dL — ABNORMAL HIGH (ref <117)

## 2013-09-19 LAB — CREATININE, SERUM: Creatinine, Ser: 0.69 mg/dL (ref 0.50–1.10)

## 2013-09-19 MED ORDER — GLUCERNA SHAKE PO LIQD
237.0000 mL | Freq: Three times a day (TID) | ORAL | Status: DC
  Administered 2013-09-19 – 2013-09-20 (×2): 237 mL via ORAL

## 2013-09-19 NOTE — Clinical Social Work Placement (Signed)
Clinical Social Work Department CLINICAL SOCIAL WORK PLACEMENT NOTE 09/19/2013  Patient:  Erica [name], Erica [name]  Account Number:  [id number] Admit date:  09/11/2013  Clinical Social Worker:  [name], LCSW  Date/time:  09/19/2013 08:26 AM  Clinical Social Work is seeking post-discharge placement for this patient at the following level of care:   [place]   (*CSW will update this form in Epic as items are completed)   09/19/2013  Patient/family provided with [place] Department of Clinical Social Work's list of facilities offering this level of care within the geographic area requested by the patient (or if unable, by the patient's family).  09/19/2013  Patient/family informed of their freedom to choose among providers that offer the needed level of care, that participate in Medicare, Medicaid or managed care program needed by the patient, have an available bed and are willing to accept the patient.  09/19/2013  Patient/family informed of [facility]' ownership interest in [facility], as well as of the fact that they are under no obligation to receive care at this facility.  PASARR submitted to EDS on 09/15/2013 PASARR number received on 09/15/2013  FL2 transmitted to all facilities in geographic area requested by pt/family on  09/19/2013 FL2 transmitted to all facilities within larger geographic area on   Patient informed that his/her managed care company has contracts with or will negotiate with  certain facilities, including the following:     Patient/family informed of bed offers received:   Patient chooses bed at  Physician recommends and patient chooses bed at    Patient to be transferred to  on   Patient to be transferred to facility by  Patient and family notified of transfer on  Name of family member notified:    The following physician request were entered in Epic:   Additional Comments:  [name], [place]

## 2013-09-19 NOTE — Clinical Social Work Note (Addendum)
CSW spoke with pt's son [name] who requests CSW to initiate bed search in [place] only. Pt's daughter's phone line is busy. He states if facility is not available in [facility], he will take her home. Aware of possible financial liability with Medicaid only. Will follow up later today.  [name], [place]

## 2013-09-19 NOTE — Progress Notes (Addendum)
Speech Language Pathology Treatment: Dysphagia  Patient Details Name: Erica [name] MRN: [id number] DOB: 1955/08/21 Today's Date: 09/19/2013 Time: 4159-0172 SLP Time Calculation (min): 10 min  Assessment / Plan / Recommendation Clinical Impression  Dysphagia treatment today for diet tolerance check with new advancement to regular/ thin yesterday. Pt demonstrated no overt s/s of aspiration today. Educated pt and son on recommendations. No further speech therapy warranted as pt is tolerating diet well. Continue regular/ thin, meds with liquid, intermittent supervision to cue for small bites/ sips and ensure that pt is seated upright. Speech will sign off- please re-consult if needs arise.    HPI HPI: 58 y.o. female with PMH of diabetes and multiple prior admits DKA, admitted 7/27, found with glucose of 10 and nonresponsive, intubated upon admission, glucose was repeatedly rising and falling. Toxic metabolic encephalopathy with seizures. Head CT 7/27 unremarkable. EEG 7/30 indicated "no brain activity." Extubated 7/30 in PM (3-day intubation). As of 7/31 has been more reponsive but likely to still have some neurologic injury. Bedside swallow eval ordered to assess readiness for POs post-extubation.   Pertinent Vitals n/a  SLP Plan  All goals met    Recommendations Diet recommendations: Regular;Thin liquid Liquids provided via: Straw;Cup Medication Administration: Whole meds with liquid Supervision: Patient able to self feed;Intermittent supervision to cue for compensatory strategies Compensations: Slow rate;Small sips/bites Postural Changes and/or Swallow Maneuvers: Seated upright 90 degrees;Upright 30-60 min after meal              Oral Care Recommendations: Oral care BID Follow up Recommendations: 24 hour supervision/assistance Plan: All goals met    GO     Kern Reap, MA, CCC-SLP 09/19/2013, 11:27 AM

## 2013-09-19 NOTE — Clinical Social Work Note (Addendum)
Presented bed offer to pt's son at [facility]. Facility reports financial liability is generally social security check minus $30. Son aware and said he is POA. States that pt has too many bills and asked if she could just go for 1-2 weeks. Verified with Claiborne Billings at facility that pt would have to stay 30 days for [facility] payment. Son plans to take pt home and has requested to speak with PT to get exercises to work with pt at home. Notified PT. CM aware of d/c plan. CSW will sign off, but can be reconsulted if needed.  [name], [place]

## 2013-09-19 NOTE — Progress Notes (Signed)
Inpatient Diabetes Program Recommendations  AACE/ADA: New Consensus Statement on Inpatient Glycemic Control (2013)  Target Ranges:  Prepandial:   less than 140 mg/dL      Peak postprandial:   less than 180 mg/dL (1-2 hours)      Critically ill patients:  140 - 180 mg/dL   Results for Erica [name], Erica [name] (MRN [id number]) as of 09/19/2013 08:56  Ref. Range 09/18/2013 07:10 09/18/2013 11:14 09/18/2013 16:33 09/18/2013 20:45 09/19/2013 07:54  Glucose-Capillary Latest Range: 70-99 mg/dL 205 (H) 332 (H) 119 (H) 324 (H) 200 (H)   Diabetes history: DM Outpatient Diabetes medications: Lantus 24 units QHS, Humalog 5 units TID with meals Current orders for Inpatient glycemic control: Lantus 10 units QHS, Novolog 0-9 units AC, Novolog 0-5 units HS, Novolog 3 units TID with meals  Inpatient Diabetes Program Recommendations Insulin - Basal: Please consider increasing Lantus to 12 units QHS. Insulin - Meal Coverage: Please consider increasing Novolog meal coverage to 6 units TID with meals. Outpatient diabetes medications: May want to consider adjusting outpatient diabetes medications at time of discharge since patient presented with severe hypoglycemia.  Thanks, Barnie Alderman, RN, MSN, CCRN Diabetes Coordinator Inpatient Diabetes Program [phone] (Team Pager) [phone] ([facility] office) [phone] [facility] office)

## 2013-09-19 NOTE — Progress Notes (Signed)
Physical Therapy Treatment Patient Details Name: Erica [name] MRN: [id number] DOB: 1955/07/30 Today's Date: 09/19/2013    History of Present Illness 58 yo female iddm found down by her family tonight who called ems.  ems found pt her glucose was 10.  On arrival to ED after given an amp of D50, glucose was up to 300 (per ed records).  Pt was posturing on arrival with fixed rightward gaze, but no overt seizure activity.  Pt was given narcan with no response.  She was intubated to protect her airway, she had good vital signs.  Soon after intubation, she started becoming more responsive, moving all of her extemities and fighting the vent at which point propofol gtt was initiated.  No family in ED, or icu.  Pt ED w/u reveals a normal ct head with no evidence of acute abnormality, normal electolytes and renal function.  Neg trop, neg cxr and ua.  uds pos for benzos.  etoh level neg.  hgb 10.8.  Wbc normal.  Glucose repeatedly rises some then dropping, has received 3 amps d50 and now on d51/2ns gtt.  Pt is moderately sedated on ventilator.    PT Comments    Per case manager, pt unable to go to SNF for rehab or receive HHPT services.  Family requested program to complete at home to assist with pt recovery.  Developed HEP for supine/seated strengthening program for pt to complete (I), and a standing program for pt to complete with supervision of family.  Educated pt and family on importance of completion of exercise program with supervision as needed to increase strength, activity tolerance, and balance for improved functional mobility skills.  Noted improved gait distance today as activity tolerance improves.  Pt still with gait deviations with veering to the Lt secondary to decreased step length on Lt LE; pt did demonstrate improved ability to recognize deviations and self correct most of the time, pt did require min assist on occasion to correct placement of RW.  Educated family on necessity of assist with  gait due to deviation for safety.  No complaints of pain with supine/seated exercises or gait today.  Recommend continued PT, with patient/family education, as appropriate for program to continue at home.       Equipment Recommendations  Rolling walker with 5" wheels           Mobility  Bed Mobility Overal bed mobility: Modified Independent Bed Mobility: Supine to Sit;Sit to Supine     Supine to sit: Modified independent (Device/Increase time) Sit to supine: Modified independent (Device/Increase time)      Transfers Overall transfer level: Needs assistance Equipment used: Rolling walker (2 wheeled) Transfers: Sit to/from [facility] Sit to Stand: Supervision Stand pivot transfers: Supervision          Ambulation/Gait Ambulation/Gait assistance: Min guard;Min assist Ambulation Distance (Feet): 80 Feet Assistive device: Rolling walker (2 wheeled) Gait Pattern/deviations: Step-through pattern;Decreased step length - left   Gait velocity interpretation: Below normal speed for age/gender General Gait Details: 69' x1, 35' x1.  Noted decreased step length of Lt LE, causing veering to the lt during gait.  Some min assist required to correct RW during gait secondary veering, though pt was able to self correct also today.         Cognition Arousal/Alertness: Awake/alert Behavior During Therapy: WFL for tasks assessed/performed Overall Cognitive Status: Within Functional Limits for tasks assessed  Exercises General Exercises - Lower Extremity Ankle Circles/Pumps: 20 reps;AROM;Both;Supine Long Arc Quad: 10 reps;Strengthening;Both;Seated Hip ABduction/ADduction: 20 reps;Strengthening;Both;Supine Straight Leg Raises: 20 reps;Strengthening;Both;Supine Hip Flexion/Marching: 10 reps;Strengthening;Both;Seated        Pertinent Vitals/Pain No complaints of pain.            PT Goals (current goals can now be found in the care plan  section) Progress towards PT goals: Progressing toward goals;Goals met and updated - see care plan    Frequency  Min 3X/week    PT Plan Current plan remains appropriate       End of Session Equipment Utilized During Treatment: Gait belt Activity Tolerance: Patient tolerated treatment well Patient left: in bed;with call bell/phone within reach;with bed alarm set     Time: 1525-1550 PT Time Calculation (min): 25 min  Charges:  $Gait Training: 8-22 mins $Therapeutic Exercise: 8-22 mins                    G Codes:      Frankey Botting 09/19/2013, 3:55 PM

## 2013-09-19 NOTE — Progress Notes (Signed)
NUTRITION FOLLOW UP  Intervention:   Glucerna Shake po TID, each supplement provides 220 kcal and 10 grams of protein  Nutrition Dx:   Inadequate oral intake; ongoing   Goal:   Pt will meet >90% of estimated nutritional needs;   Monitor:   PO/supplement intake, labs, weight changes, I/O's  Assessment:   SLP reevaluated and pt has been advanced to a regular consistency diet.  Both pt and son confirm good appetite; PO: 50-100%. Pt son reports that pt drinks Boost at home and is willing to continue supplement during hospitalization. Agreeable to try Glucerna due to elevated blood sugars. Reviewed DM/ Heart Healthy diet guidelines with pt and son. Encouraged continued use of supplement at home to prevent further weight loss. Both pt and son very grateful for RD visit.  Labs reviewed. K now WNL (receiving KCL supplement). Glucose: 204. CBGs: [id number]. BUN: 3.  CSW has initiated bed search. Plan is to d/c to SNF if a [facility] facility is available. Otherwise, pt will d/c home with family.   Height: Ht Readings from Last 1 Encounters:  09/12/13 5' (1.524 m)    Weight Status:   Wt Readings from Last 1 Encounters:  09/12/13 89 lb 15.9 oz (40.82 kg)    Re-estimated needs:  Kcal: 1230-1435  Protein: 51-61 grams  Fluid: >1.2 L   Skin: rt ankle abrasion  Diet Order: Heart Healthy/Carb Modified    Intake/Output Summary (Last 24 hours) at 09/19/13 0829 Last data filed at 09/18/13 1903  Gross per 24 hour  Intake    720 ml  Output    600 ml  Net    120 ml    Last BM: 09/18/13   Labs:   Recent Labs Lab 09/16/13 0538 09/17/13 0654 09/18/13 0642 09/19/13 0617  NA 143 146 143  --   K 4.2 4.2 4.4  --   CL 109 112 108  --   CO2 24 24 26   --   BUN 4* 5* 3*  --   CREATININE 0.57 0.63 0.63 0.69  CALCIUM 8.9 8.8 9.1  --   GLUCOSE 196* 139* 204*  --     CBG (last 3)   Recent Labs  09/18/13 1633 09/18/13 2045 09/19/13 0754  GLUCAP 119* 324* 200*    Scheduled  Meds: . amoxicillin-clavulanate  1 tablet Oral Q12H  . antiseptic oral rinse  15 mL Mouth Rinse QID  . chlorhexidine  15 mL Mouth Rinse BID  . enoxaparin (LOVENOX) injection  30 mg Subcutaneous Q24H  . folic acid  1 mg Oral Daily  . insulin aspart  0-5 Units Subcutaneous QHS  . insulin aspart  0-9 Units Subcutaneous TID WC  . insulin aspart  3 Units Subcutaneous TID WC  . insulin glargine  10 Units Subcutaneous QHS  . levETIRAcetam  1,000 mg Oral BID  . lisinopril  10 mg Oral Daily  . metoCLOPramide  5 mg Oral QID  . metoprolol tartrate  50 mg Oral BID  . mirtazapine  15 mg Oral QHS  . multivitamin with minerals  1 tablet Oral Daily  . pantoprazole  40 mg Oral Daily  . PARoxetine  20 mg Oral Daily  . potassium chloride  40 mEq Oral Daily  . thiamine  100 mg Oral Daily    Continuous Infusions: . sodium chloride 50 mL/hr at 09/19/13 0522    Jahaira Earnhart A. Jimmye Norman, RD, LDN Pager: [phone]

## 2013-09-19 NOTE — Progress Notes (Signed)
[facility] PROGRESS NOTE  Erica [name] [id number] DOB: 02-13-1956 DOA: 09/11/2013 PCP: Octavio Graves, DO  Assessment/Plan: 58 year old female with history of alcohol abuse, diabetes, hypertension, prior history of NSTEMI who presented to [facility] ED 09/11/2013 with altered mental status, low blood sugar (per family member blood glucose was 10 at home). On admission, patient had a seizure and has required intubation for airway protection. She was found to have normal alcohol level on the admission, urine drug screen was positive for benzos. She was started on Unasyn for treatment of possible aspiration pneumonia.   1. Acute Metabolic encephalopathy/ Seizures due to metabolic disorder/ Acute respiratory failure requiring reintubation:  Likely secondary to hypoglycemia. Alcohol level and ammonia level was within normal limits on the admission.  Patient was intubated for air or protection on admission and subsequently extubated.  No further seizure episodes reported.   2. Probable aspiration pneumonia  Patient was started on Unasyn on the admission. Unasyn stopped 09/16/2013 and patient transition to Augmentin  Blood cultures to date are negative  3. Seizures  Continue Keppra 1,000 milligrams by mouth twice a day 4. Type II diabetes mellitus without mention of complication, uncontrolled  No recent A1c on file. Pend HA1c;  Appreciate diabetic coordinator input.  Continue Lantus 10 units at bedtime, NovoLog 3 units 3 times a day with meal and sliding scale insulin -if patient could not administer multiple insulin regimen, likely needs lantus only; pene HA1c 5. History of alcohol use  Alcohol level within normal limits on this admission. Ammonia level within normal limits.  Continue folic acid, multivitamin and thiamine 6. Severe protein caloric malnutrition:  Likely in the setting of chronic illness. Diet is dysphagia 2. Ongoing swallow evaluation. 7. Hypertension  Continue  lisinopril 10 mg daily, metoprolol 50 mg twice daily. 8. Anxiety and depression  Continue Paxil. Family preferred that patient does not get Xanax. They were in agreement with Ativan 0.5 mg IV every 12 hours only as needed.   DVT prophylaxis: Lovenox sub Q while pt is in hospital   Consultants:  PCCM  Neurology  Procedures:  Intubation on admission  Central line placemat  Antibiotics:  Unasyn 09/15/2013 --> 09/16/2013  Augmentin 09/16/2013 -->   Code Status: full Family Communication:  D/w patient, her son (indicate person spoken with, relationship, and if by phone, the number) Disposition Plan: 24-48 hrs pend family decision ? SNF vs Home    HPI/Subjective: alert  Objective: Filed Vitals:   09/19/13 0440  BP: 148/80  Pulse: 86  Temp: 98.8 F (37.1 C)  Resp: 20    Intake/Output Summary (Last 24 hours) at 09/19/13 1053 Last data filed at 09/18/13 1903  Gross per 24 hour  Intake    480 ml  Output      0 ml  Net    480 ml   Filed Weights   09/11/13 2107 09/12/13 0200  Weight: 40.824 kg (90 lb) 40.82 kg (89 lb 15.9 oz)    Exam:   General:  alert  Cardiovascular: s1,s2 rrr  Respiratory: CTA BL  Abdomen: soft, nt, nd  Musculoskeletal: no LE edema   Data Reviewed: Basic Metabolic Panel:  Recent Labs Lab 09/14/13 0407 09/15/13 0424 09/16/13 0538 09/17/13 0654 09/18/13 0642 09/19/13 0617  NA 145 144 143 146 143  --   K 3.2* 3.4* 4.2 4.2 4.4  --   CL 111 104 109 112 108  --   CO2 22 23 24 24 26   --  GLUCOSE 180* 120* 196* 139* 204*  --   BUN 6 4* 4* 5* 3*  --   CREATININE 0.68 0.58 0.57 0.63 0.63 0.69  CALCIUM 8.3* 8.8 8.9 8.8 9.1  --    Liver Function Tests:  Recent Labs Lab 09/13/13 0454 09/15/13 0424  AST 16 22  ALT 9 11  ALKPHOS 59 59  BILITOT 0.4 0.8  PROT 5.6* 6.4  ALBUMIN 2.7* 3.1*   No results found for this basename: LIPASE, AMYLASE,  in the last 168 hours No results found for this basename: AMMONIA,  in the last 168  hours CBC:  Recent Labs Lab 09/13/13 0454 09/14/13 0407 09/15/13 0424  WBC 8.8 5.9 5.9  NEUTROABS  --  3.7 3.2  HGB 9.8* 9.3* 9.4*  HCT 28.7* 27.3* 27.8*  MCV 87.8 87.5 87.7  PLT 154 124* 139*   Cardiac Enzymes:  Recent Labs Lab 09/12/13 1254  TROPONINI <0.30   BNP (last 3 results)  Recent Labs  09/24/12 1958  PROBNP 38.8   CBG:  Recent Labs Lab 09/18/13 0710 09/18/13 1114 09/18/13 1633 09/18/13 2045 09/19/13 0754  GLUCAP 205* 332* 119* 324* 200*    Recent Results (from the past 240 hour(s))  MRSA PCR SCREENING     Status: None   Collection Time    09/12/13 12:15 AM      Result Value Ref Range Status   MRSA by PCR NEGATIVE  NEGATIVE Final   Comment:            The GeneXpert MRSA Assay (FDA     approved for NASAL specimens     only), is one component of a     comprehensive MRSA colonization     surveillance program. It is not     intended to diagnose MRSA     infection nor to guide or     monitor treatment for     MRSA infections.  CULTURE, BLOOD (ROUTINE X 2)     Status: None   Collection Time    09/12/13 12:54 PM      Result Value Ref Range Status   Specimen Description BLOOD PICC LINE DRAWN BY RN LS   Final   Special Requests     Final   Value: BOTTLES DRAWN AEROBIC AND ANAEROBIC AEB=8CC ANA=6CC   Culture NO GROWTH 5 DAYS   Final   Report Status 09/17/2013 FINAL   Final  CULTURE, BLOOD (ROUTINE X 2)     Status: None   Collection Time    09/12/13  1:00 PM      Result Value Ref Range Status   Specimen Description BLOOD LEFT FOOT   Final   Special Requests BOTTLES DRAWN AEROBIC ONLY 4CC   Final   Culture NO GROWTH 5 DAYS   Final   Report Status 09/17/2013 FINAL   Final  URINE CULTURE     Status: None   Collection Time    09/13/13  5:55 PM      Result Value Ref Range Status   Specimen Description URINE, CATHETERIZED   Final   Special Requests NONE   Final   Culture  Setup Time     Final   Value: 09/13/2013 23:34     Performed at [facility]     Final   Value: 40,000 COLONIES/ML     Performed at [facility]   Culture     Final   Value: KLEBSIELLA PNEUMONIAE  Performed at [facility]   Report Status 09/16/2013 FINAL   Final   Organism ID, Bacteria KLEBSIELLA PNEUMONIAE   Final  URINE CULTURE     Status: None   Collection Time    09/14/13  3:58 PM      Result Value Ref Range Status   Specimen Description URINE, CATHETERIZED   Final   Special Requests Normal   Final   Culture  Setup Time     Final   Value: 09/15/2013 03:28     Performed at [facility] Count     Final   Value: NO GROWTH     Performed at [facility]   Culture     Final   Value: NO GROWTH     Performed at [facility]   Report Status 09/16/2013 FINAL   Final  CULTURE, BLOOD (ROUTINE X 2)     Status: None   Collection Time    09/14/13  4:11 PM      Result Value Ref Range Status   Specimen Description BLOOD LEFT WRIST   Final   Special Requests BOTTLES DRAWN AEROBIC AND ANAEROBIC 6CC   Final   Culture NO GROWTH 5 DAYS   Final   Report Status 09/19/2013 FINAL   Final  CULTURE, BLOOD (ROUTINE X 2)     Status: None   Collection Time    09/14/13  4:16 PM      Result Value Ref Range Status   Specimen Description BLOOD PICC LINE DRAWN BY RN   Final   Special Requests BOTTLES DRAWN AEROBIC AND ANAEROBIC 6CC   Final   Culture NO GROWTH 5 DAYS   Final   Report Status 09/19/2013 FINAL   Final  CLOSTRIDIUM DIFFICILE BY PCR     Status: None   Collection Time    09/16/13  9:39 AM      Result Value Ref Range Status   C difficile by pcr NEGATIVE  NEGATIVE Final     Studies: No results found.  Scheduled Meds: . amoxicillin-clavulanate  1 tablet Oral Q12H  . antiseptic oral rinse  15 mL Mouth Rinse QID  . chlorhexidine  15 mL Mouth Rinse BID  . enoxaparin (LOVENOX) injection  30 mg Subcutaneous Q24H  . feeding supplement (GLUCERNA SHAKE)  237 mL Oral TID BM  . folic acid   1 mg Oral Daily  . insulin aspart  0-5 Units Subcutaneous QHS  . insulin aspart  0-9 Units Subcutaneous TID WC  . insulin aspart  3 Units Subcutaneous TID WC  . insulin glargine  10 Units Subcutaneous QHS  . levETIRAcetam  1,000 mg Oral BID  . lisinopril  10 mg Oral Daily  . metoCLOPramide  5 mg Oral QID  . metoprolol tartrate  50 mg Oral BID  . mirtazapine  15 mg Oral QHS  . multivitamin with minerals  1 tablet Oral Daily  . pantoprazole  40 mg Oral Daily  . PARoxetine  20 mg Oral Daily  . potassium chloride  40 mEq Oral Daily  . thiamine  100 mg Oral Daily   Continuous Infusions: . sodium chloride 50 mL/hr at 09/19/13 0522    Principal Problem:   Metabolic encephalopathy Active Problems:   Hypoglycemia   Alcohol abuse   H/O chronic pancreatitis   Protein-calorie malnutrition, severe   Type II or unspecified type diabetes mellitus without mention of complication, uncontrolled   Acute respiratory failure requiring reintubation  probable Seizures due to metabolic disorder   Moderate protein malnutrition   Aspiration pneumonia    Time spent: >35 minutes     Kinnie Feil  [facility] Pager [phone]. If 7PM-7AM, please contact night-coverage at [email or web address], password [facility] 09/19/2013, 10:53 AM  LOS: 8 days

## 2013-09-20 LAB — GLUCOSE, CAPILLARY
GLUCOSE-CAPILLARY: 119 mg/dL — AB (ref 70–99)
Glucose-Capillary: 113 mg/dL — ABNORMAL HIGH (ref 70–99)

## 2013-09-20 MED ORDER — INSULIN ASPART 100 UNIT/ML FLEXPEN
3.0000 [IU] | PEN_INJECTOR | Freq: Three times a day (TID) | SUBCUTANEOUS | Status: DC
Start: 2013-09-20 — End: 2017-11-29

## 2013-09-20 MED ORDER — CHLORDIAZEPOXIDE HCL 25 MG PO CAPS: 25.0000 mg | ORAL_CAPSULE | Freq: Two times a day (BID) | ORAL | Status: DC | PRN

## 2013-09-20 MED ORDER — OXYCODONE HCL 5 MG PO TABS: 5.0000 mg | ORAL_TABLET | ORAL | Status: DC | PRN

## 2013-09-20 MED ORDER — LEVETIRACETAM 1000 MG PO TABS: 1000.0000 mg | ORAL_TABLET | Freq: Two times a day (BID) | ORAL | Status: DC

## 2013-09-20 MED ORDER — INSULIN GLARGINE 100 UNIT/ML ~~LOC~~ SOLN: 8.0000 [IU] | Freq: Every day | SUBCUTANEOUS | Status: DC

## 2013-09-20 NOTE — Progress Notes (Signed)
Patient and family received discharge instructions.  Patient had no further questions.  Patient was escorted to vehicle via wheelchair by nurse tech.

## 2013-09-20 NOTE — Progress Notes (Signed)
Physical Therapy Treatment Patient Details Name: Erica [name] MRN: [id number] DOB: 06-19-1955 Today's Date: 09/20/2013    History of Present Illness 58 yo female iddm found down by her family tonight who called ems.  ems found pt her glucose was 10.  On arrival to ED after given an amp of D50, glucose was up to 300 (per ed records).  Pt was posturing on arrival with fixed rightward gaze, but no overt seizure activity.  Pt was given narcan with no response.  She was intubated to protect her airway, she had good vital signs.  Soon after intubation, she started becoming more responsive, moving all of her extemities and fighting the vent at which point propofol gtt was initiated.  No family in ED, or icu.  Pt ED w/u reveals a normal ct head with no evidence of acute abnormality, normal electolytes and renal function.  Neg trop, neg cxr and ua.  uds pos for benzos.  etoh level neg.  hgb 10.8.  Wbc normal.  Glucose repeatedly rises some then dropping, has received 3 amps d50 and now on d51/2ns gtt.  Pt is moderately sedated on ventilator.    PT Comments    Family present for treatment today for planned PT session.  Completed standing exercises on handout given to family yesterday for family observation of assist required.  Pt able to complete HR, TR, and marching with close SBA and holding onto RW.  With semi-tandem balance exercise, pt required 1 HHA due to unsteadiness and was able to maintain for 10 seconds on Rt and Lt LE.  Pt demonstrated improved gait tolerance today with improved distance.  Pt continues to have decreased step length on the Lt, though only intermittently today with occasional veering to the Lt which pt was able to self correct.  Noted increased difficulty with navigation in closed environment vs. Open environment.  Educated family on importance of supervision/close SBA during gait for safety and assist with postioning/propulsion of RW as needed.  Educated family on progression for  short distance ambulation from RW to HHA/countertop assist in bathroom to no AD if tolerated by pt and gait is safe.  Family and pt verbalize understanding and importance of safety during gait.  Recommend continued PT for family educated and increasing normalization of gait at tolerated for improved functional mobility.       Equipment Recommendations  Rolling walker with 5" wheels           Mobility  Bed Mobility Overal bed mobility: Modified Independent                Transfers Overall transfer level: Modified independent Equipment used: Rolling walker (2 wheeled)                Ambulation/Gait Ambulation/Gait assistance: Supervision;Min guard Ambulation Distance (Feet): 200 Feet Assistive device: Rolling walker (2 wheeled) Gait Pattern/deviations: Step-through pattern;Decreased step length - left;Drifts right/left   Gait velocity interpretation: Below normal speed for age/gender          Cognition Arousal/Alertness: Awake/alert Behavior During Therapy: WFL for tasks assessed/performed Overall Cognitive Status: Within Functional Limits for tasks assessed                      Exercises General Exercises - Lower Extremity Hip Flexion/Marching: 10 reps;Strengthening;Both;Standing Toe Raises: 10 reps;Strengthening;Both;Standing Heel Raises: 10 reps;Strengthening;Both;Standing Other Exercises Other Exercises: Semi Tandem Balance with 1 HHA, x10" for 2 trials each        Pertinent Vitals/Pain  No complaints of pain.          PT Goals (current goals can now be found in the care plan section) Progress towards PT goals: Progressing toward goals    Frequency  Min 3X/week    PT Plan Current plan remains appropriate       End of Session Equipment Utilized During Treatment: Gait belt Activity Tolerance: Patient tolerated treatment well Patient left: in chair;with call bell/phone within reach;with chair alarm set;with family/visitor present      Time: 1004-1030 PT Time Calculation (min): 26 min  Charges:  $Gait Training: 8-22 mins $Therapeutic Exercise: 8-22 mins                    Nikolaj Geraghty 09/20/2013, 10:34 AM

## 2013-09-20 NOTE — Progress Notes (Signed)
PICC line removed.  PICC line came out easily and pressure was applied due to site wanting to bleed.  Patient was instructed on what to do if it began to bleed at home.  Site does not have redness, swelling or heat.  PICC was removed by Earl Lagos, RN.

## 2013-09-20 NOTE — Discharge Summary (Addendum)
Physician Discharge Summary  TIGER PRIMAS [id number] DOB: 02/06/1956 DOA: 09/11/2013  PCP: Octavio Graves, DO  Admit date: 09/11/2013 Discharge date: 09/20/2013  Recommendations for Outpatient Follow-up:  1. Diabetes mellitus uncontrolled by hemoglobin A1c with decrease of Lantus on discharge and substitution of NovoLog for Humalog by family request. 2. Seizures secondary to hypoglycemia. Continue Keppra, followup with neurology as below.  3. Consider further evaluation for normocytic anemia and thrombocytopenia as clinically indicated. Intermittent thrombocytopenia seen in the past. 4. Note patient has required almost no benzodiazepines during her hospitalization and there is no evidence of withdrawal. Family requested avoiding Xanax if possible.  5. Polypharmacy, medications on admission included scheduled Xanax, Librium scheduled, tramadol, oxycodone. Recommend BZD wean, started already with Librium change to BID PRN. Stopped Xanax given lack of need for BZD, no evidence of withdrawal during hospitalization and infrequent use at home. 6. Needs to f/u with PCP for narcotics   Follow-up Information   Follow up with [place], [place], DO. Schedule an appointment as soon as possible for a visit in 1 week.   Contact information:   [address] [place] [address] [phone]      Discharge Diagnoses:  1. Acute respiratory failure requiring intubation, secondary to hypoglycemia 2. Profound hypoglycemia 3. Suspected seizure secondary to hypoglycemia 4. Acute encephalopathy secondary to hypoglycemia 5. Possible aspiration pneumonia 6. Klebsiella pneumonia UTI 7. Diabetes mellitus uncontrolled by hemoglobin A1c 8. Normocytic anemia 9. Minimal thrombocytopenia, intermittent   Discharge Condition: Improved  Disposition: Home health RN  Diet recommendation: Diabetic diet  Filed Weights   09/11/13 2107 09/12/13 0200  Weight: 40.824 kg (90 lb) 40.82 kg (89 lb 15.9 oz)     History of present illness:  58 year old woman with history of diabetes mellitus presented after being found unresponsive by family. Per EMS CBG was 10. Mental status failed to improve with correction of hypoglycemia. On arrival patient was noted to have posturing and fixed rightward gaze but no overt seizure activity. Unresponsive to Narcan. Intubated immediately in ED.  Hospital Course:  She was seen in consultation with neurology and pulmonology and after several days suddenly improved and was successfully extubated. Abnormal neurologic exam was concerning for permanent cognitive deficits. Neurology recommended continuing [place] for seizure secondary to hypoglycemia. Underlying etiology of her presentation was felt to be profound hypoglycemia causing seizures and encephalopathy. Blood sugars subsequently stabilized at a much lower dose Lantus. Skilled nursing facility was recommended but the family declined. She is now stable for discharge. See individual issues below.  1. Acute respiratory failure requiring intubation. Doing well status post extubation 7/30. Secondary to profound encephalopathy from hypoglycemia. 2. Profound hypoglycemia prior to admission with blood sugar of 10, likely secondary to too high dose long-acting Lantus in combination with recent change from Novolog to Humalog (per daughter based on recent [facility] hospitalization). She has has had no recurrent hypoglycemia in the last several days and currently is stable on Lantus 10 with meal coverage NovoLog 3 each meal 3. Suspected seizure secondary to hypoglycemia. No seizures during hospitalization. Continue Keppra per neurology. 4. Acute encephalopathy, toxic metabolic secondary to severe hypoglycemia. Residual encephalopathy, only time will tell whether patient will have permanent residual deficit as per neurology 5. Possible aspiration pneumonia. Completes therapy today. No hypoxia or tachypnea. 6. Klebsiella pneumonia UTI,  treated. 7. Diabetes mellitus. Hemoglobin A1c 10.6. Plan as below. 8. Normocytic anemia, stable. Followup as an outpatient. 9. Thrombocytopenia, minimal, trending upwards. Suspect secondary to acute illness. 10. History of alcohol  abuse and withdrawal. Denies recent use. 11. Anxiety. The patient has had minimal dosing of Xanax (which only takes PRN per daughter) which the family request to be discontinued and required almost no BZD during her 9 day hospitalization hospitalization. There is no evidence of alcohol withdrawal. 12. Suspect a component of her encephalopathy was related to polypharmacy. Plan discharge home today. Home with home health RN for disease management. Patient has family with her 24/7. Discussed changes to insulin regimen with daughter who will coordinate her care: Continue to monitor blood sugars closely, clearly understands decreasing Lantus to 8 units daily. She requests change from Humalog to NovoLog, as patient tolerated NovoLog better in the past, she noted that Humalog seemed to cause hypoglycemia. We discussed meal coverage 3 units with each meal.  Followup with PCP in one week, daughter will make phone call to coordinate followup  Continue Keppra with outpatient neurology followup in one month  CBC as an outpatient, followup anemia, further evaluation as clinically indicated.  Given the fact she is required no narcotics during this hospitalization, and has required very little benzodiazepines (6 doses since admission, 2 of which while intubated), including none in last 48 hours, would recommend weaning benzodiazepines (as long-term Librium per daughter will decrease to BID PRN) Xanax was only used infrequently, therefore stop.  Consultants:  Pulmonology  Neurology  Speech therapy: REGULAR, THIN LIQUID  Physical therapy: snf Procedures:  7/27 #18g EJ placed py Dr. Melanee Left to left neck  ETT 7/27 >> 7/30  7/27 IO RLE  EEG IMPRESSION:  1. This recording is grossly  abnormal showing severe generalized slowing indicating a generalized encephalopathy. There are no epileptiform activities observed.  Antibiotics:  Zosyn 7/29 >> 7/31  Unasyn 09/15/2013 --> 09/16/2013  Augmentin 09/16/2013 --> 8/5  Discharge Instructions  Discharge Instructions   Diet - low sodium heart healthy    Complete by:  As directed      Diet Carb Modified    Complete by:  As directed      Discharge instructions    Complete by:  As directed   Check your blood sugars carefully at least 2-3 times per day. Notify your provider for blood sugar less than 70 or greater than 400. Note that Lantus has been decreased. Note Humalog has been discontinued. Recommend discussion of medications with primary care provider on your next office visit. Followup with your primary care provider August 10 (daughter will arrange). Use walker.     Increase activity slowly    Complete by:  As directed             Medication List    STOP taking these medications       ALPRAZolam 0.5 MG tablet  Commonly known as:  XANAX     HUMALOG KWIKPEN 100 UNIT/ML KiwkPen  Generic drug:  insulin lispro     oxycodone 30 MG immediate release tablet  Commonly known as:  ROXICODONE     traMADol 50 MG tablet  Commonly known as:  ULTRAM      TAKE these medications       chlordiazePOXIDE 25 MG capsule  Commonly known as:  LIBRIUM  Take 1 capsule (25 mg total) by mouth 2 (two) times daily as needed for anxiety.     esomeprazole 40 MG capsule  Commonly known as:  NEXIUM  Take 40 mg by mouth daily.     fenofibrate 145 MG tablet  Commonly known as:  TRICOR  Take 145 mg by mouth daily.  folic acid 1 MG tablet  Commonly known as:  FOLVITE  Take 1 mg by mouth daily.     gentamicin 0.3 % ophthalmic solution  Commonly known as:  GARAMYCIN  Place 1 drop into both eyes 3 (three) times daily.     hyoscyamine 0.125 MG tablet  Commonly known as:  LEVSIN, ANASPAZ  Take 0.125 mg by mouth 4 (four) times daily -  after meals and at bedtime.     insulin aspart 100 UNIT/ML FlexPen  Commonly known as:  NOVOLOG  Inject 3 Units into the skin 3 (three) times daily with meals.     insulin glargine 100 UNIT/ML injection  Commonly known as:  LANTUS  Inject 0.08 mLs (8 Units total) into the skin at bedtime.     levETIRAcetam 1000 MG tablet  Commonly known as:  KEPPRA  Take 1 tablet (1,000 mg total) by mouth 2 (two) times daily.     lisinopril 10 MG tablet  Commonly known as:  PRINIVIL,ZESTRIL  Take 10 mg by mouth daily.     lubiprostone 8 MCG capsule  Commonly known as:  AMITIZA  Take 8 mcg by mouth 2 (two) times daily with a meal.     megestrol 40 MG tablet  Commonly known as:  MEGACE  Take 40 mg by mouth 2 (two) times daily.     metoCLOPramide 5 MG tablet  Commonly known as:  REGLAN  Take 5 mg by mouth 4 (four) times daily.     metoprolol 50 MG tablet  Commonly known as:  LOPRESSOR  Take 50 mg by mouth 2 (two) times daily.     mirtazapine 15 MG tablet  Commonly known as:  REMERON  Take 15 mg by mouth at bedtime.     multivitamin with minerals Tabs tablet  Take 1 tablet by mouth daily.     PARoxetine 20 MG tablet  Commonly known as:  PAXIL  Take 20 mg by mouth daily.     potassium bicarbonate 25 MEQ disintegrating tablet  Commonly known as:  K-LYTE  Take 25 mEq by mouth daily.     PROAIR HFA 108 (90 BASE) MCG/ACT inhaler  Generic drug:  albuterol  Inhale 2 puffs into the lungs every 6 (six) hours as needed for wheezing or shortness of breath.     rOPINIRole 1 MG tablet  Commonly known as:  REQUIP  Take 1 mg by mouth at bedtime. BETWEEN 7 PM AND 8 PM.       Allergies  Allergen Reactions  . Aspirin Palpitations    The results of significant diagnostics from this hospitalization (including imaging, microbiology, ancillary and laboratory) are listed below for reference.    Significant Diagnostic Studies: Ct Head Wo Contrast  09/11/2013   CLINICAL DATA:  Altered mental  status.  The patient is unresponsive.  EXAM: CT HEAD WITHOUT CONTRAST  TECHNIQUE: Contiguous axial images were obtained from the base of the skull through the vertex without intravenous contrast.  COMPARISON:  07/08/2011  FINDINGS: No mass lesion. No midline shift. No acute hemorrhage or hematoma. No extra-axial fluid collections. No evidence of acute infarction. There is mild diffuse atrophy with no ventricular dilatation. Brain parenchyma is otherwise normal. Osseous structures are normal.  IMPRESSION: No significant abnormality.  Slight diffuse atrophy, unchanged.   Electronically Signed   By: Rozetta Nunnery M.D.   On: 09/11/2013 22:23   Dg Chest Portable 1 View  09/11/2013   CLINICAL DATA:  Post intubation.  EXAM: PORTABLE CHEST -  1 VIEW  COMPARISON:  04/28/2013  FINDINGS: Endotracheal tube is been placed with tip measuring 3.1 cm above the carina. Enteric tube was placed with tip over the EG junction, likely in the distal esophagus. Proximal side hole is in the mid esophageal region. Normal heart size and pulmonary vascularity. No focal airspace disease or consolidation in the lungs. No pneumothorax. No blunting of costophrenic angles.  IMPRESSION: Endotracheal tube tip measures 3.1 cm above the carinal. Enteric tube tip appears to be in the distal esophagus.   Electronically Signed   By: Lucienne Capers M.D.   On: 09/11/2013 22:11    Microbiology: Recent Results (from the past 240 hour(s))  MRSA PCR SCREENING     Status: None   Collection Time    09/12/13 12:15 AM      Result Value Ref Range Status   MRSA by PCR NEGATIVE  NEGATIVE Final   Comment:            The GeneXpert MRSA Assay (FDA     approved for NASAL specimens     only), is one component of a     comprehensive MRSA colonization     surveillance program. It is not     intended to diagnose MRSA     infection nor to guide or     monitor treatment for     MRSA infections.  CULTURE, BLOOD (ROUTINE X 2)     Status: None   Collection  Time    09/12/13 12:54 PM      Result Value Ref Range Status   Specimen Description BLOOD PICC LINE DRAWN BY RN LS   Final   Special Requests     Final   Value: BOTTLES DRAWN AEROBIC AND ANAEROBIC AEB=8CC ANA=6CC   Culture NO GROWTH 5 DAYS   Final   Report Status 09/17/2013 FINAL   Final  CULTURE, BLOOD (ROUTINE X 2)     Status: None   Collection Time    09/12/13  1:00 PM      Result Value Ref Range Status   Specimen Description BLOOD LEFT FOOT   Final   Special Requests BOTTLES DRAWN AEROBIC ONLY 4CC   Final   Culture NO GROWTH 5 DAYS   Final   Report Status 09/17/2013 FINAL   Final  URINE CULTURE     Status: None   Collection Time    09/13/13  5:55 PM      Result Value Ref Range Status   Specimen Description URINE, CATHETERIZED   Final   Special Requests NONE   Final   Culture  Setup Time     Final   Value: 09/13/2013 23:34     Performed at [place]     Final   Value: 40,000 COLONIES/ML     Performed at [facility]   Culture     Final   Value: KLEBSIELLA PNEUMONIAE     Performed at [facility]   Report Status 09/16/2013 FINAL   Final   Organism ID, Bacteria KLEBSIELLA PNEUMONIAE   Final  URINE CULTURE     Status: None   Collection Time    09/14/13  3:58 PM      Result Value Ref Range Status   Specimen Description URINE, CATHETERIZED   Final   Special Requests Normal   Final   Culture  Setup Time     Final   Value: 09/15/2013 03:28     Performed  at [place]     Final   Value: NO GROWTH     Performed at [facility]   Culture     Final   Value: NO GROWTH     Performed at [facility]   Report Status 09/16/2013 FINAL   Final  CULTURE, BLOOD (ROUTINE X 2)     Status: None   Collection Time    09/14/13  4:11 PM      Result Value Ref Range Status   Specimen Description BLOOD LEFT WRIST   Final   Special Requests BOTTLES DRAWN AEROBIC AND ANAEROBIC 6CC   Final   Culture NO GROWTH  5 DAYS   Final   Report Status 09/19/2013 FINAL   Final  CULTURE, BLOOD (ROUTINE X 2)     Status: None   Collection Time    09/14/13  4:16 PM      Result Value Ref Range Status   Specimen Description BLOOD PICC LINE DRAWN BY RN   Final   Special Requests BOTTLES DRAWN AEROBIC AND ANAEROBIC 6CC   Final   Culture NO GROWTH 5 DAYS   Final   Report Status 09/19/2013 FINAL   Final  CLOSTRIDIUM DIFFICILE BY PCR     Status: None   Collection Time    09/16/13  9:39 AM      Result Value Ref Range Status   C difficile by pcr NEGATIVE  NEGATIVE Final     Labs: Basic Metabolic Panel:  Recent Labs Lab 09/14/13 0407 09/15/13 0424 09/16/13 0538 09/17/13 0654 09/18/13 0642 09/19/13 0617  NA 145 144 143 146 143  --   K 3.2* 3.4* 4.2 4.2 4.4  --   CL 111 104 109 112 108  --   CO2 22 23 24 24 26   --   GLUCOSE 180* 120* 196* 139* 204*  --   BUN 6 4* 4* 5* 3*  --   CREATININE 0.68 0.58 0.57 0.63 0.63 0.69  CALCIUM 8.3* 8.8 8.9 8.8 9.1  --    Liver Function Tests:  Recent Labs Lab 09/15/13 0424  AST 22  ALT 11  ALKPHOS 59  BILITOT 0.8  PROT 6.4  ALBUMIN 3.1*   CBC:  Recent Labs Lab 09/14/13 0407 09/15/13 0424  WBC 5.9 5.9  NEUTROABS 3.7 3.2  HGB 9.3* 9.4*  HCT 27.3* 27.8*  MCV 87.5 87.7  PLT 124* 139*     Recent Labs  09/24/12 1958  PROBNP 38.8   CBG:  Recent Labs Lab 09/19/13 1129 09/19/13 1640 09/19/13 2120 09/20/13 0742 09/20/13 1207  GLUCAP 119* 168* 250* 113* 119*    Principal Problem:   Metabolic encephalopathy Active Problems:   Hypoglycemia   Alcohol abuse   H/O chronic pancreatitis   Protein-calorie malnutrition, severe   Type II or unspecified type diabetes mellitus without mention of complication, uncontrolled   Acute respiratory failure requiring reintubation   probable Seizures due to metabolic disorder   Moderate protein malnutrition   Aspiration pneumonia   Time coordinating discharge: 90 minutes Including phone call to primary  care physician who is unavailable by telephone  Signed:  Murray Hodgkins, MD [facility] 09/20/2013, 3:26 PM

## 2013-09-20 NOTE — Progress Notes (Signed)
Notified MD that patient had PICC line and was instructed to remove PICC line prior to discharge.

## 2013-09-20 NOTE — Progress Notes (Signed)
PROGRESS NOTE  Erica [name] [id number] DOB: [birth year] DOA: 09/11/2013 PCP: Octavio Graves, DO  Summary: 58 year old woman with history of diabetes mellitus presented after being found unresponsive by family. Per EMS CBG was 10. Mental status failed to improve with correction of hypoglycemia. On arrival patient was noted to have posturing and fixed rightward gaze but no overt seizure activity. Unresponsive to Narcan. Intubated immediately. Then became more responsive. She was seen in consultation with neurology and pulmonology and after several days suddenly improved and was extubated. Started on empiric antibiotics for possible aspiration pneumonia. Skilled nursing facility was recommended but the family declined. Blood sugars have subsequently stabilized and now stable for discharge.  Assessment/Plan: 1. Acute respiratory failure requiring intubation. Doing well status post extubation 7/30. Secondary to profound encephalopathy from hypoglycemia. 2. Profound hypoglycemia prior to admission with blood sugar of 10, likely secondary to too high dose long-acting Lantus in combination with recent change from Novolog to Humalog (per daughter based on recent [facility] hospitalization). She has had no recurrent hypoglycemia and currently is stable on Lantus 10 with meal coverage NovoLog 3 each meal 3. Suspected seizure secondary to hypoglycemia. No seizures during hospitalization. Continue Keppra per neurology. 4. Acute encephalopathy, toxic metabolic secondary to severe hypoglycemia, possibly secondary to hypoglycemia or alcohol withdrawal. Residual encephalopathy, only time will tell whether patient will have permanent residual deficit. 5. Possible aspiration pneumonia. Completes therapy today. No hypoxia or tachypnea. 6. Klebsiella pneumonia UTI, treated. 7. Diabetes mellitus. Hemoglobin A1c 10.6. Plan as below. 8. Normocytic anemia, stable. Followup as an outpatient. 9. Thrombocytopenia,  minimal, trending upwards. Suspect secondary to acute illness. 10. urinary retention with Foley catheter insertion 7/31 11. History of alcohol abuse and withdrawal. Denies recent use. 12. Anxiety, consider discontinuing Xanax.   Plan discharge home today. Home with home health RN for disease management. Patient has family with her 45/. Discussed changes to insulin regimen with daughter who will coordinate her care: Continue to monitor blood sugars closely, clearly understands decreasing Lantus to 8 units daily. She requests change from Humalog to NovoLog, as patient tolerated NovoLog better in the past, she noted that Humalog seemed to cause hypoglycemia. We discussed meal coverage 3 units with each meal.   Followup with PCP in one week, daughter will make phone call to coordinate followup  Continue Keppra with outpatient neurology followup in one month   CBC as an outpatient, followup anemia, further evaluation as clinically indicated. Followup resolution of mild recent pain.  Murray Hodgkins, MD  [facility]  Pager [phone] If 7PM-7AM, please contact night-coverage at [email or web address], password [facility] 09/20/2013, 2:06 PM  LOS: 9 days   Consultants:  Pulmonology  Neurology  Speech therapy: REGULAR, THIN LIQUID  Physical therapy: snf  Procedures:  7/27 #18g EJ placed py Dr. Melanee Left to left neck  ETT 7/27 >> 7/30  7/27 IO RLE   EEG IMPRESSION:  1. This recording is grossly abnormal showing severe generalized slowing indicating a generalized encephalopathy. There are no epileptiform activities observed.   Antibiotics:  Zosyn 7/29 >> 7/31  Unasyn 09/15/2013 --> 09/16/2013   Augmentin 09/16/2013 --> 8/5  HPI/Subjective: No issues overnight, no new complaints. Has some chronic pain and chronic leg pain. Family at bedside. Hope to take her home today.  Objective: Filed Vitals:   09/19/13 1437 09/19/13 2228 09/20/13 0547 09/20/13 1138  BP: 162/81 126/66 146/74 137/69    Pulse: 75 82 75 76  Temp: 98.6 F (37 C) 98.9 F (37.2 C) 98.7 F (37.1 C)  TempSrc: Oral Oral Oral   Resp: 20 20 20    Height:      Weight:      SpO2: 98% 100% 100%     Intake/Output Summary (Last 24 hours) at 09/20/13 1406 Last data filed at 09/20/13 0840  Gross per 24 hour  Intake    840 ml  Output   1625 ml  Net   -785 ml     Filed Weights   09/11/13 2107 09/12/13 0200  Weight: 40.824 kg (90 lb) 40.82 kg (89 lb 15.9 oz)    Exam:     Afebrile, vital signs stable. No hypoxia  Gen. Appears calm and comfortable.  Psych. Alert. Speech fluent and clear. Oriented to self, location, month, year.  Eyes. Appears unremarkable.  Cardiovascular. Regular rate and rhythm. No murmur, rub or gallop. No lower extremity edema.  Respiratory. Clear to auscultation bilaterally. No wheezes, rales or rhonchi. Normal respiratory effort.  Musculoskeletal. Moves all extremities well. Grossly normal tone and strength all extremities.  Neurologic. No focal deficits noted.  Data Reviewed: I/O: Excellent urine output. Multiple BM.  Chemistry: Capillary blood sugars stable.   Scheduled Meds: . amoxicillin-clavulanate  1 tablet Oral Q12H  . antiseptic oral rinse  15 mL Mouth Rinse QID  . chlorhexidine  15 mL Mouth Rinse BID  . enoxaparin (LOVENOX) injection  30 mg Subcutaneous Q24H  . feeding supplement (GLUCERNA SHAKE)  237 mL Oral TID BM  . folic acid  1 mg Oral Daily  . insulin aspart  0-5 Units Subcutaneous QHS  . insulin aspart  0-9 Units Subcutaneous TID WC  . insulin aspart  3 Units Subcutaneous TID WC  . insulin glargine  10 Units Subcutaneous QHS  . levETIRAcetam  1,000 mg Oral BID  . lisinopril  10 mg Oral Daily  . metoCLOPramide  5 mg Oral QID  . metoprolol tartrate  50 mg Oral BID  . mirtazapine  15 mg Oral QHS  . multivitamin with minerals  1 tablet Oral Daily  . pantoprazole  40 mg Oral Daily  . PARoxetine  20 mg Oral Daily  . thiamine  100 mg Oral Daily    Continuous Infusions: . sodium chloride 50 mL/hr at 09/19/13 0522    Principal Problem:   Metabolic encephalopathy Active Problems:   Hypoglycemia   Alcohol abuse   H/O chronic pancreatitis   Protein-calorie malnutrition, severe   Type II or unspecified type diabetes mellitus without mention of complication, uncontrolled   Acute respiratory failure requiring reintubation   probable Seizures due to metabolic disorder   Moderate protein malnutrition   Aspiration pneumonia

## 2013-09-22 NOTE — Progress Notes (Signed)
UR chart review completed.  

## 2014-07-25 ENCOUNTER — Observation Stay (HOSPITAL_COMMUNITY)
Admission: EM | Admit: 2014-07-25 | Discharge: 2014-07-27 | Disposition: A | Discharge status: Home / Self Care | Payer: Medicaid Other | Attending: Internal Medicine | Admitting: Internal Medicine

## 2014-07-25 ENCOUNTER — Encounter (HOSPITAL_COMMUNITY): Payer: Self-pay

## 2014-07-25 DIAGNOSIS — R42 Dizziness and giddiness: Secondary | ICD-10-CM | POA: Diagnosis present

## 2014-07-25 DIAGNOSIS — E119 Type 2 diabetes mellitus without complications: Secondary | ICD-10-CM | POA: Diagnosis not present

## 2014-07-25 DIAGNOSIS — R569 Unspecified convulsions: Secondary | ICD-10-CM

## 2014-07-25 DIAGNOSIS — E860 Dehydration: Secondary | ICD-10-CM | POA: Diagnosis not present

## 2014-07-25 DIAGNOSIS — Z8719 Personal history of other diseases of the digestive system: Secondary | ICD-10-CM

## 2014-07-25 DIAGNOSIS — E440 Moderate protein-calorie malnutrition: Secondary | ICD-10-CM

## 2014-07-25 DIAGNOSIS — F1010 Alcohol abuse, uncomplicated: Secondary | ICD-10-CM

## 2014-07-25 DIAGNOSIS — E876 Hypokalemia: Secondary | ICD-10-CM

## 2014-07-25 DIAGNOSIS — R7989 Other specified abnormal findings of blood chemistry: Secondary | ICD-10-CM

## 2014-07-25 DIAGNOSIS — E162 Hypoglycemia, unspecified: Secondary | ICD-10-CM

## 2014-07-25 DIAGNOSIS — Z794 Long term (current) use of insulin: Secondary | ICD-10-CM | POA: Insufficient documentation

## 2014-07-25 DIAGNOSIS — K529 Noninfective gastroenteritis and colitis, unspecified: Secondary | ICD-10-CM | POA: Diagnosis not present

## 2014-07-25 DIAGNOSIS — N200 Calculus of kidney: Secondary | ICD-10-CM | POA: Insufficient documentation

## 2014-07-25 DIAGNOSIS — K219 Gastro-esophageal reflux disease without esophagitis: Secondary | ICD-10-CM | POA: Diagnosis not present

## 2014-07-25 DIAGNOSIS — Z79899 Other long term (current) drug therapy: Secondary | ICD-10-CM | POA: Insufficient documentation

## 2014-07-25 DIAGNOSIS — I951 Orthostatic hypotension: Secondary | ICD-10-CM

## 2014-07-25 DIAGNOSIS — R011 Cardiac murmur, unspecified: Secondary | ICD-10-CM | POA: Diagnosis not present

## 2014-07-25 DIAGNOSIS — E43 Unspecified severe protein-calorie malnutrition: Secondary | ICD-10-CM

## 2014-07-25 DIAGNOSIS — E872 Acidosis, unspecified: Secondary | ICD-10-CM

## 2014-07-25 DIAGNOSIS — G629 Polyneuropathy, unspecified: Secondary | ICD-10-CM | POA: Insufficient documentation

## 2014-07-25 DIAGNOSIS — E889 Metabolic disorder, unspecified: Secondary | ICD-10-CM

## 2014-07-25 DIAGNOSIS — Z720 Tobacco use: Secondary | ICD-10-CM

## 2014-07-25 DIAGNOSIS — A080 Rotaviral enteritis: Secondary | ICD-10-CM

## 2014-07-25 DIAGNOSIS — R945 Abnormal results of liver function studies: Secondary | ICD-10-CM

## 2014-07-25 DIAGNOSIS — J9600 Acute respiratory failure, unspecified whether with hypoxia or hypercapnia: Secondary | ICD-10-CM

## 2014-07-25 DIAGNOSIS — I214 Non-ST elevation (NSTEMI) myocardial infarction: Secondary | ICD-10-CM

## 2014-07-25 DIAGNOSIS — G9341 Metabolic encephalopathy: Secondary | ICD-10-CM

## 2014-07-25 LAB — CBC WITH DIFFERENTIAL/PLATELET
BASOS ABS: 0 10*3/uL (ref 0.0–0.1)
Basophils Relative: 0 % (ref 0–1)
EOS ABS: 0.1 10*3/uL (ref 0.0–0.7)
Eosinophils Relative: 1 % (ref 0–5)
HCT: 39.5 % (ref 36.0–46.0)
HEMOGLOBIN: 13.4 g/dL (ref 12.0–15.0)
Lymphocytes Relative: 34 % (ref 12–46)
Lymphs Abs: 2.4 10*3/uL (ref 0.7–4.0)
MCH: 29.8 pg (ref 26.0–34.0)
MCHC: 33.9 g/dL (ref 30.0–36.0)
MCV: 88 fL (ref 78.0–100.0)
MONO ABS: 0.4 10*3/uL (ref 0.1–1.0)
Monocytes Relative: 6 % (ref 3–12)
NEUTROS ABS: 4.2 10*3/uL (ref 1.7–7.7)
NEUTROS PCT: 59 % (ref 43–77)
PLATELETS: 248 10*3/uL (ref 150–400)
RBC: 4.49 MIL/uL (ref 3.87–5.11)
RDW: 12.4 % (ref 11.5–15.5)
WBC: 7.2 10*3/uL (ref 4.0–10.5)

## 2014-07-25 LAB — COMPREHENSIVE METABOLIC PANEL
ALBUMIN: 4 g/dL (ref 3.5–5.0)
ALT: 16 U/L (ref 14–54)
AST: 22 U/L (ref 15–41)
Alkaline Phosphatase: 81 U/L (ref 38–126)
Anion gap: 17 — ABNORMAL HIGH (ref 5–15)
BILIRUBIN TOTAL: 0.4 mg/dL (ref 0.3–1.2)
BUN: 17 mg/dL (ref 6–20)
CALCIUM: 9.3 mg/dL (ref 8.9–10.3)
CO2: 17 mmol/L — ABNORMAL LOW (ref 22–32)
CREATININE: 0.74 mg/dL (ref 0.44–1.00)
Chloride: 99 mmol/L — ABNORMAL LOW (ref 101–111)
GFR calc non Af Amer: >60 mL/min (ref >60)
GLUCOSE: 202 mg/dL — AB (ref 65–99)
Potassium: 3.4 mmol/L — ABNORMAL LOW (ref 3.5–5.1)
Sodium: 133 mmol/L — ABNORMAL LOW (ref 135–145)
TOTAL PROTEIN: 7.4 g/dL (ref 6.5–8.1)

## 2014-07-25 LAB — URINALYSIS, ROUTINE W REFLEX MICROSCOPIC
Bilirubin Urine: NEGATIVE
Glucose, UA: 500 mg/dL — AB
Hgb urine dipstick: NEGATIVE
KETONES UR: NEGATIVE mg/dL
Leukocytes, UA: NEGATIVE
Nitrite: NEGATIVE
PROTEIN: NEGATIVE mg/dL
UROBILINOGEN UA: 0.2 mg/dL (ref 0.0–1.0)
pH: 6 (ref 5.0–8.0)

## 2014-07-25 LAB — ETHANOL: Alcohol, Ethyl (B): 71 mg/dL — ABNORMAL HIGH (ref <5)

## 2014-07-25 LAB — I-STAT CG4 LACTIC ACID, ED: LACTIC ACID, VENOUS: 5.28 mmol/L — AB (ref 0.5–2.0)

## 2014-07-25 MED ORDER — SODIUM CHLORIDE 0.9 % IV BOLUS (SEPSIS)
1000.0000 mL | Freq: Once | INTRAVENOUS | Status: AC
  Administered 2014-07-25: 1000 mL via INTRAVENOUS

## 2014-07-25 MED ORDER — SODIUM CHLORIDE 0.9 % IV SOLN: INTRAVENOUS | Status: DC

## 2014-07-25 NOTE — ED Provider Notes (Signed)
TIME SEEN: 10:03 PM   CHIEF COMPLAINT: Lightheadedness  HPI: HPI Comments: Erica [name] is a 59 y.o. female who presents to the Emergency Department complaining of light headedness onset 1 week. Pt report bilateral sharp pains in her leg for the past 2 weeks. Pt also states that he feet are constantly numb. Reports diarrhea x5 per day without associated abdominal pain. Pt denies any vomiting or nausea. Pt states she drink alcohol occasionally and her last use was today. States she drinks liquor. Has been admitted several times in the past for dehydration, hyperglycemia. She denies chest pain or shortness of breath. Denies fever. No cough. No dysuria or hematuria.    ROS: See HPI Constitutional: no fever  Eyes: no drainage  ENT: no runny nose   Cardiovascular:  no chest pain  Resp: no SOB  GI: no vomiting GU: no dysuria Integumentary: no rash  Allergy: no hives  Musculoskeletal: no leg swelling  Neurological: no slurred speech ROS otherwise negative  PAST MEDICAL HISTORY/PAST SURGICAL HISTORY:  Past Medical History  Diagnosis Date  . Chronic diarrhea   . History of kidney stones   . Heart murmur   . Neuropathy     Hx: of  . GERD (gastroesophageal reflux disease)   . Diabetes mellitus     fasting blood sugar 110-120s    MEDICATIONS:  Prior to Admission medications   Medication Sig Start Date End Date Taking? Authorizing Provider  albuterol (PROAIR HFA) 108 (90 BASE) MCG/ACT inhaler Inhale 2 puffs into the lungs every 6 (six) hours as needed for wheezing or shortness of breath.    Historical Provider, MD  chlordiazePOXIDE (LIBRIUM) 25 MG capsule Take 1 capsule (25 mg total) by mouth 2 (two) times daily as needed for anxiety. 09/20/13   Samuella Cota, MD  esomeprazole (NEXIUM) 40 MG capsule Take 40 mg by mouth daily.     Historical Provider, MD  fenofibrate (TRICOR) 145 MG tablet Take 145 mg by mouth daily.    Historical Provider, MD  folic acid (FOLVITE) 1 MG tablet  Take 1 mg by mouth daily.    Historical Provider, MD  gentamicin (GARAMYCIN) 0.3 % ophthalmic solution Place 1 drop into both eyes 3 (three) times daily.    Historical Provider, MD  hyoscyamine (LEVSIN, ANASPAZ) 0.125 MG tablet Take 0.125 mg by mouth 4 (four) times daily - after meals and at bedtime.    Historical Provider, MD  insulin aspart (NOVOLOG) 100 UNIT/ML FlexPen Inject 3 Units into the skin 3 (three) times daily with meals. 09/20/13   Samuella Cota, MD  insulin glargine (LANTUS) 100 UNIT/ML injection Inject 0.08 mLs (8 Units total) into the skin at bedtime. 09/20/13   Samuella Cota, MD  levETIRAcetam (KEPPRA) 1000 MG tablet Take 1 tablet (1,000 mg total) by mouth 2 (two) times daily. 09/20/13   Samuella Cota, MD  lisinopril (PRINIVIL,ZESTRIL) 10 MG tablet Take 10 mg by mouth daily.    Historical Provider, MD  lubiprostone (AMITIZA) 8 MCG capsule Take 8 mcg by mouth 2 (two) times daily with a meal.    Historical Provider, MD  megestrol (MEGACE) 40 MG tablet Take 40 mg by mouth 2 (two) times daily.    Historical Provider, MD  metoCLOPramide (REGLAN) 5 MG tablet Take 5 mg by mouth 4 (four) times daily.    Historical Provider, MD  metoprolol (LOPRESSOR) 50 MG tablet Take 50 mg by mouth 2 (two) times daily.    Historical Provider, MD  mirtazapine (REMERON) 15 MG tablet Take 15 mg by mouth at bedtime.    Historical Provider, MD  Multiple Vitamin (MULTIVITAMIN WITH MINERALS) TABS tablet Take 1 tablet by mouth daily.    Historical Provider, MD  PARoxetine (PAXIL) 20 MG tablet Take 20 mg by mouth daily.    Historical Provider, MD  potassium bicarbonate (K-LYTE) 25 MEQ disintegrating tablet Take 25 mEq by mouth daily.     Historical Provider, MD  rOPINIRole (REQUIP) 1 MG tablet Take 1 mg by mouth at bedtime. BETWEEN 7 PM AND 8 PM.    Historical Provider, MD    ALLERGIES:  Allergies  Allergen Reactions  . Aspirin Palpitations    SOCIAL HISTORY:  History  Substance Use Topics  .  Smoking status: Current Every Day Smoker  . Smokeless tobacco: Current User    Types: Snuff  . Alcohol Use: 0.0 oz/week     Comment: weekly    FAMILY HISTORY: Family History  Problem Relation Age of Onset  . Diabetes Sister     EXAM: BP 153/89 mmHg  Pulse 99  Temp(Src) 98.6 F (37 C) (Oral)  Resp 16  Ht 5' (1.524 m)  Wt 100 lb (45.36 kg)  BMI 19.53 kg/m2  SpO2 98% CONSTITUTIONAL: Alert and oriented and responds appropriately to questions. Thin, appears older than stated age, in no distress HEAD: Normocephalic EYES: Conjunctivae clear, PERRL; No conjunctival pallor ENT: normal nose; no rhinorrhea; Dry mucous membranes; pharynx without lesions noted NECK: Supple, no meningismus, no LAD  CARD: RRR; S1 and S2 appreciated; no murmurs, no clicks, no rubs, no gallops RESP: Normal chest excursion without splinting or tachypnea; breath sounds clear and equal bilaterally; no wheezes, no rhonchi, no rales, no hypoxia or respiratory distress, speaking full sentences ABD/GI: Normal bowel sounds; non-distended; soft, non-tender, no rebound, no guarding, no peritoneal signs BACK:  The back appears normal and is non-tender to palpation, there is no CVA tenderness EXT: Normal ROM in all joints; non-tender to palpation; no edema; normal capillary refill; no cyanosis, no calf tenderness or swelling    SKIN: Normal color for age and race; warm NEURO: Moves all extremities equally, sensation to light touch intact diffusely, cranial nerves II through XII intact PSYCH: The patient's mood and manner are appropriate. Grooming and personal hygiene are appropriate.  MEDICAL DECISION MAKING: Patient here with diarrhea for several days. She reports more than 5 episodes a day. Her abdominal exam is benign. She is tachycardic but afebrile. She does appear very dry on exam. Will give IV fluids. She is orthostatic. We'll obtain labs, urine. Will send stool cultures.  ED PROGRESS: Patient's labs showed lactate  of 5.2, alcohol 71. She has a bicarbonate of 17 with an anion gap of 17. Glucose is 202. Suspect she is dehydrated. Rectal temp is 99. Will send blood and urine cultures. Repeat abdominal exam still benign. We'll continue to hydrate. Discussed with Dr. Shanon Brow with hospitalist service for admission for IV hydration given her lactic acidosis.    CRITICAL CARE Performed by: Nyra Jabs   Total critical care time: 40 minutes  Critical care time was exclusive of separately billable procedures and treating other patients.  Critical care was necessary to treat or prevent imminent or life-threatening deterioration.  Critical care was time spent personally by me on the following activities: development of treatment plan with patient and/or surrogate as well as nursing, discussions with consultants, evaluation of patient's response to treatment, examination of patient, obtaining history from patient or surrogate,  ordering and performing treatments and interventions, ordering and review of laboratory studies, ordering and review of radiographic studies, pulse oximetry and re-evaluation of patient's condition.     EKG Interpretation  Date/Time:  Wednesday July 25 2014 21:51:54 EDT Ventricular Rate:  99 PR Interval:  149 QRS Duration: 67 QT Interval:  357 QTC Calculation: 458 R Axis:   -7 Text Interpretation:  Sinus tachycardia Low voltage, precordial leads Consider anterior infarct Baseline wander in lead(s) I V3 Confirmed by Trang Bouse,  DO, Fatoumata Albaugh [id number]) on 07/25/2014 10:20:57 PM           I personally performed the services described in this documentation, which was scribed in my presence. The recorded information has been reviewed and is accurate.    [place], DO 07/26/14 [phone]

## 2014-07-25 NOTE — ED Notes (Signed)
Generalized weakness and diarrhea since yesterday per EMS. Patient states that she feels dizzy today. Patient states that she has been drinking [place] today.

## 2014-07-26 ENCOUNTER — Encounter (HOSPITAL_COMMUNITY): Payer: Self-pay

## 2014-07-26 ENCOUNTER — Observation Stay (HOSPITAL_COMMUNITY): Payer: Medicaid Other

## 2014-07-26 DIAGNOSIS — E860 Dehydration: Secondary | ICD-10-CM | POA: Diagnosis not present

## 2014-07-26 DIAGNOSIS — F1010 Alcohol abuse, uncomplicated: Secondary | ICD-10-CM | POA: Diagnosis not present

## 2014-07-26 DIAGNOSIS — I951 Orthostatic hypotension: Secondary | ICD-10-CM

## 2014-07-26 DIAGNOSIS — E872 Acidosis: Secondary | ICD-10-CM

## 2014-07-26 DIAGNOSIS — E1140 Type 2 diabetes mellitus with diabetic neuropathy, unspecified: Secondary | ICD-10-CM | POA: Diagnosis not present

## 2014-07-26 LAB — GLUCOSE, CAPILLARY
GLUCOSE-CAPILLARY: 202 mg/dL — AB (ref 65–99)
GLUCOSE-CAPILLARY: 289 mg/dL — AB (ref 65–99)
Glucose-Capillary: 173 mg/dL — ABNORMAL HIGH (ref 65–99)
Glucose-Capillary: 353 mg/dL — ABNORMAL HIGH (ref 65–99)
Glucose-Capillary: 307 mg/dL — ABNORMAL HIGH (ref 65–99)

## 2014-07-26 LAB — BASIC METABOLIC PANEL
Anion gap: 11 (ref 5–15)
Anion gap: 10 (ref 5–15)
BUN: 15 mg/dL (ref 6–20)
BUN: 14 mg/dL (ref 6–20)
CO2: 17 mmol/L — ABNORMAL LOW (ref 22–32)
CO2: 21 mmol/L — AB (ref 22–32)
CREATININE: 0.7 mg/dL (ref 0.44–1.00)
Calcium: 8.2 mg/dL — ABNORMAL LOW (ref 8.9–10.3)
Calcium: 8.6 mg/dL — ABNORMAL LOW (ref 8.9–10.3)
Chloride: 108 mmol/L (ref 101–111)
Chloride: 108 mmol/L (ref 101–111)
Creatinine, Ser: 0.65 mg/dL (ref 0.44–1.00)
GFR calc Af Amer: >60 mL/min (ref >60)
GLUCOSE: 231 mg/dL — AB (ref 65–99)
Glucose, Bld: 192 mg/dL — ABNORMAL HIGH (ref 65–99)
POTASSIUM: 3.4 mmol/L — AB (ref 3.5–5.1)
POTASSIUM: 3.8 mmol/L (ref 3.5–5.1)
Sodium: 136 mmol/L (ref 135–145)
Sodium: 139 mmol/L (ref 135–145)

## 2014-07-26 LAB — CBC
HEMATOCRIT: 35.5 % — AB (ref 36.0–46.0)
Hemoglobin: 12 g/dL (ref 12.0–15.0)
MCH: 29.9 pg (ref 26.0–34.0)
MCHC: 33.8 g/dL (ref 30.0–36.0)
MCV: 88.5 fL (ref 78.0–100.0)
Platelets: 190 10*3/uL (ref 150–400)
RBC: 4.01 MIL/uL (ref 3.87–5.11)
RDW: 12.5 % (ref 11.5–15.5)
WBC: 7.8 10*3/uL (ref 4.0–10.5)

## 2014-07-26 LAB — LACTIC ACID, PLASMA
LACTIC ACID, VENOUS: 4.8 mmol/L — AB (ref 0.5–2.0)
LACTIC ACID, VENOUS: 1.2 mmol/L (ref 0.5–2.0)
Lactic Acid, Venous: 5.2 mmol/L (ref 0.5–2.0)

## 2014-07-26 MED ORDER — FOLIC ACID 1 MG PO TABS
1.0000 mg | ORAL_TABLET | Freq: Every day | ORAL | Status: DC
  Administered 2014-07-26 – 2014-07-27 (×2): 1 mg via ORAL
  Filled 2014-07-26 (×2): qty 1

## 2014-07-26 MED ORDER — POTASSIUM CHLORIDE IN NACL 20-0.9 MEQ/L-% IV SOLN
INTRAVENOUS | Status: DC
  Administered 2014-07-26 – 2014-07-27 (×3): via INTRAVENOUS

## 2014-07-26 MED ORDER — ALUM & MAG HYDROXIDE-SIMETH 200-200-20 MG/5ML PO SUSP: 30.0000 mL | Freq: Four times a day (QID) | ORAL | Status: DC | PRN

## 2014-07-26 MED ORDER — SODIUM CHLORIDE 0.9 % IV BOLUS (SEPSIS)
1000.0000 mL | Freq: Once | INTRAVENOUS | Status: AC
  Administered 2014-07-26: 1000 mL via INTRAVENOUS

## 2014-07-26 MED ORDER — INSULIN ASPART 100 UNIT/ML ~~LOC~~ SOLN
0.0000 [IU] | Freq: Every day | SUBCUTANEOUS | Status: DC
  Administered 2014-07-26: 3 [IU] via SUBCUTANEOUS

## 2014-07-26 MED ORDER — LORAZEPAM 1 MG PO TABS
0.0000 mg | ORAL_TABLET | Freq: Four times a day (QID) | ORAL | Status: DC
  Filled 2014-07-26: qty 1

## 2014-07-26 MED ORDER — ADULT MULTIVITAMIN W/MINERALS CH
1.0000 | ORAL_TABLET | Freq: Every day | ORAL | Status: DC
  Administered 2014-07-26 – 2014-07-27 (×2): 1 via ORAL
  Filled 2014-07-26 (×2): qty 1

## 2014-07-26 MED ORDER — ALBUTEROL SULFATE (2.5 MG/3ML) 0.083% IN NEBU
3.0000 mL | INHALATION_SOLUTION | Freq: Four times a day (QID) | RESPIRATORY_TRACT | Status: DC | PRN
Start: 2014-07-26 — End: 2014-07-27

## 2014-07-26 MED ORDER — LORAZEPAM 1 MG PO TABS: 0.0000 mg | ORAL_TABLET | Freq: Two times a day (BID) | ORAL | Status: DC

## 2014-07-26 MED ORDER — ONDANSETRON HCL 4 MG PO TABS: 4.0000 mg | ORAL_TABLET | Freq: Four times a day (QID) | ORAL | Status: DC | PRN

## 2014-07-26 MED ORDER — LORAZEPAM 1 MG PO TABS: 1.0000 mg | ORAL_TABLET | Freq: Four times a day (QID) | ORAL | Status: DC | PRN

## 2014-07-26 MED ORDER — VITAMIN B-1 100 MG PO TABS
100.0000 mg | ORAL_TABLET | Freq: Every day | ORAL | Status: DC
  Administered 2014-07-26 – 2014-07-27 (×2): 100 mg via ORAL
  Filled 2014-07-26 (×2): qty 1

## 2014-07-26 MED ORDER — ONDANSETRON HCL 4 MG/2ML IJ SOLN: 4.0000 mg | Freq: Four times a day (QID) | INTRAMUSCULAR | Status: DC | PRN

## 2014-07-26 MED ORDER — LORAZEPAM 2 MG/ML IJ SOLN: 1.0000 mg | Freq: Four times a day (QID) | INTRAMUSCULAR | Status: DC | PRN

## 2014-07-26 MED ORDER — THIAMINE HCL 100 MG/ML IJ SOLN
100.0000 mg | Freq: Every day | INTRAMUSCULAR | Status: DC
  Filled 2014-07-26: qty 2

## 2014-07-26 MED ORDER — SODIUM CHLORIDE 0.9 % IJ SOLN
3.0000 mL | Freq: Two times a day (BID) | INTRAMUSCULAR | Status: DC
  Administered 2014-07-26 – 2014-07-27 (×3): 3 mL via INTRAVENOUS

## 2014-07-26 MED ORDER — INSULIN ASPART 100 UNIT/ML ~~LOC~~ SOLN
0.0000 [IU] | Freq: Three times a day (TID) | SUBCUTANEOUS | Status: DC
  Administered 2014-07-26: 9 [IU] via SUBCUTANEOUS
  Administered 2014-07-26: 5 [IU] via SUBCUTANEOUS
  Administered 2014-07-26: 2 [IU] via SUBCUTANEOUS
  Administered 2014-07-27: 3 [IU] via SUBCUTANEOUS

## 2014-07-26 NOTE — Evaluation (Signed)
Physical Therapy Evaluation Patient Details Name: Erica [name] MRN: [id number] DOB: [birth year] Today's Date: 07/26/2014   History of Present Illness  59 yo female h/o etoh abuse, gerd, iddm, comes in with several days of dizziness upon standing. Reports that she gets lightheaded, especially when getting up to move. No fevers. No n/v. Has chronic diarrhea which has not changes. Pt initially denies drinking any etoh, than after several times of asking she admitted she drank a lot today. Reports she only drinks about once a week. No cough. No abd pain. No dysuria. Says she drinks plenty of water a day. Pt found to have an elevated lactic acid level and orthostatic, asked to admit for ivf. Pt reports she is feeling better after getting 2 liters ivf in the ED.  Clinical Impression   Pt was seen for evaluation.  She was alert and cooperative, able to follow all directions.  She had no c/o of dizziness today.  She did report LE weakness.  She lives with her children, daughter is her aide.  She normally ambulates with a cane.  She does have generalized LE weakness per MMT as well as decreased standing balance.  Her gait is much more stable using a rolling walker and with this her functional endurance is acceptable.  She would be appropriate for OP PT for a strengthening program and pt agrees to this.    Follow Up Recommendations Outpatient PT    Equipment Recommendations  None recommended by PT    Recommendations for Other Services   none    Precautions / Restrictions Precautions Precautions: Fall Restrictions Weight Bearing Restrictions: No      Mobility  Bed Mobility Overal bed mobility: Modified Independent                Transfers Overall transfer level: Modified independent                  Ambulation/Gait Ambulation/Gait assistance: Supervision Ambulation Distance (Feet): 200 Feet Assistive device: Rolling walker (2 wheeled) Gait Pattern/deviations:  Shuffle   Gait velocity interpretation: at or above normal speed for age/gender General Gait Details: frequent cues to increase hip flexion during swing through                     Balance Overall balance assessment: Needs assistance Sitting-balance support: No upper extremity supported;Feet supported Sitting balance-Leahy Scale: Good     Standing balance support: No upper extremity supported Standing balance-Leahy Scale: Fair Standing balance comment: because of decreased standing balance pt is ambulating more securely with a walker rather than a cane                             Pertinent Vitals/Pain Pain Assessment: No/denies pain    Home Living Family/patient expects to be discharged to:: Private residence Living Arrangements: Children Available Help at Discharge: Family;Available 24 hours/day Type of Home: Apartment Home Access: Level entry     Home Layout: One level Home Equipment: Walker - 2 wheels;Cane - single point Additional Comments: Pt lives in a single story home with her son with one small step to enter.  DME : RW, tub shower    Prior Function Level of Independence: Independent with assistive device(s)         Comments: usuallly walks with a cane             Extremity/Trunk Assessment  Lower Extremity Assessment: Generalized weakness      Cervical / Trunk Assessment: Normal  Communication   Communication: No difficulties  Cognition Arousal/Alertness: Awake/alert Behavior During Therapy: WFL for tasks assessed/performed Overall Cognitive Status: Within Functional Limits for tasks assessed                                    Assessment/Plan    PT Assessment All further PT needs can be met in the next venue of care  PT Diagnosis Generalized weakness   PT Problem List Decreased strength;Decreased activity tolerance;Decreased balance  PT Treatment Interventions     PT Goals (Current  goals can be found in the Care Plan section) Acute Rehab PT Goals PT Goal Formulation: All assessment and education complete, DC therapy         Barriers to discharge  none                      End of Session Equipment Utilized During Treatment: Gait belt Activity Tolerance: Patient tolerated treatment well Patient left: in chair;with call bell/phone within reach;with chair alarm set           Time: 5790-7931 PT Time Calculation (min) (ACUTE ONLY): 46 min   Charges:   PT Evaluation $Initial PT Evaluation Tier I: 1 Procedure     PT G CodesDemetrios Isaacs L  PT 07/26/2014, 3:51 PM [phone]

## 2014-07-26 NOTE — Progress Notes (Signed)
PROGRESS NOTE  Erica [name] [id number] DOB: 10-19-1955 DOA: 07/25/2014 PCP: Octavio Graves, DO  Assessment/Plan: Lactic acidosis-  -probably due to more dehydration which is probably chronic in the setting of etoh use and metformin, no evidence of underlying infection.  -cxr ordered shows no infiltrate and ua negative, wbc is normal and pt is afebrile.  -lactic acid normal   Diabetes mellitus- Hold metformin which can also cause lactic acidosis, place on ssi.   Alcohol abuse-  -CIWA -social work offered treatment- patient declined   H/O chronic pancreatitis- stable  Dehydration- as above  Chronic diarrhea- cdiff pending from ED, doubt cdiff  Orthostatic hypotension- appears resolved  PT Eval  Code Status: full Family Communication: patient Disposition Plan: home later today or in AM   Consultants:    Procedures:      HPI/Subjective: No SOB, no CP   Objective: Filed Vitals:   07/26/14 0600  BP: 128/72  Pulse: 98  Temp:   Resp:     Intake/Output Summary (Last 24 hours) at 07/26/14 0925 Last data filed at 07/26/14 0700  Gross per 24 hour  Intake    603 ml  Output      0 ml  Net    603 ml   Filed Weights   07/25/14 2126 07/26/14 0600  Weight: 45.36 kg (100 lb) 47.9 kg (105 lb 9.6 oz)    Exam:   General:  Pleasant/cooperative  Cardiovascular: rrr  Respiratory: clear, no wheezing  Abdomen: +BS,soft   Musculoskeletal: no edema   Data Reviewed: Basic Metabolic Panel:  Recent Labs Lab 07/25/14 2230 07/26/14 0114 07/26/14 0411  NA 133* 136 139  K 3.4* 3.4* 3.8  CL 99* 108 108  CO2 17* 17* 21*  GLUCOSE 202* 192* 231*  BUN 17 15 14   CREATININE 0.74 0.70 0.65  CALCIUM 9.3 8.2* 8.6*   Liver Function Tests:  Recent Labs Lab 07/25/14 2230  AST 22  ALT 16  ALKPHOS 81  BILITOT 0.4  PROT 7.4  ALBUMIN 4.0   No results for input(s): LIPASE, AMYLASE in the last 168 hours. No results for input(s): AMMONIA in the  last 168 hours. CBC:  Recent Labs Lab 07/25/14 2230 07/26/14 0411  WBC 7.2 7.8  NEUTROABS 4.2  --   HGB 13.4 12.0  HCT 39.5 35.5*  MCV 88.0 88.5  PLT 248 190   Cardiac Enzymes: No results for input(s): CKTOTAL, CKMB, CKMBINDEX, TROPONINI in the last 168 hours. BNP (last 3 results) No results for input(s): BNP in the last 8760 hours.  ProBNP (last 3 results) No results for input(s): PROBNP in the last 8760 hours.  CBG:  Recent Labs Lab 07/26/14 0201 07/26/14 0741  GLUCAP 173* 202*    Recent Results (from the past 240 hour(s))  Blood culture (routine x 2)     Status: None (Preliminary result)   Collection Time: 07/25/14 11:30 PM  Result Value Ref Range Status   Specimen Description BLOOD RIGHT HAND  Final   Special Requests BOTTLES DRAWN AEROBIC ONLY 4CC  Final   Culture PENDING  Incomplete   Report Status PENDING  Incomplete     Studies: Dg Chest 1 View  07/26/2014   CLINICAL DATA:  Lightheadedness, 1 week duration.  EXAM: CHEST  1 VIEW  COMPARISON:  09/14/2013  FINDINGS: The heart size and mediastinal contours are within normal limits. Both lungs are clear. The visualized skeletal structures are unremarkable.  IMPRESSION: No active disease.   Electronically Signed   By:  Andreas Newport M.D.   On: 07/26/2014 01:14    Scheduled Meds: . folic acid  1 mg Oral Daily  . insulin aspart  0-5 Units Subcutaneous QHS  . insulin aspart  0-9 Units Subcutaneous TID WC  . LORazepam  0-4 mg Oral Q6H   Followed by  . [START ON 07/28/2014] LORazepam  0-4 mg Oral Q12H  . multivitamin with minerals  1 tablet Oral Daily  . sodium chloride  3 mL Intravenous Q12H  . thiamine  100 mg Oral Daily   Or  . thiamine  100 mg Intravenous Daily   Continuous Infusions: . 0.9 % NaCl with KCl 20 mEq / L 100 mL/hr at 07/26/14 0229   Antibiotics Given (last 72 hours)    None      Principal Problem:   Lactic acidosis Active Problems:   Diabetes mellitus   Alcohol abuse   H/O chronic  pancreatitis   Dehydration   Chronic diarrhea   Orthostatic hypotension    Time spent: 25 min    Gennell How, [facility] Pager [phone]. If 7PM-7AM, please contact night-coverage at [email or web address], password [email or web address] 07/26/2014, 9:25 AM

## 2014-07-26 NOTE — Clinical Social Work Note (Signed)
Clinical Social Work Assessment  Patient Details  Name: Erica [name] MRN: [id number] Date of Birth: 01/10/1956  Date of referral:  07/26/14               Reason for consult:  Substance Use/ETOH Abuse                Permission sought to share information with:    Permission granted to share information::     Name::        Agency::     Relationship::     Contact Information:     Housing/Transportation Living arrangements for the past 2 months:  Single Family Home Source of Information:  Patient Patient Interpreter Needed:  None Criminal Activity/Legal Involvement Pertinent to Current Situation/Hospitalization:  No - Comment as needed Significant Relationships:  Adult Children Lives with:  Adult Children Do you feel safe going back to the place where you live?  Yes Need for family participation in patient care:  Yes (Comment)  Care giving concerns:  Pt states her family is with her almost all of the time. She lives with her son.    Social Worker assessment / plan:  CSW met with pt at bedside following referral for substance use. Pt alert and oriented, but took some time to answer questions. She states she lives at home with her son and she believes her daughter is her CAP aid as she is with her during the day. She describes her family as supportive. Pt said that family is with her almost all of the time. She states she is on disability and has been for a long time, but is not exactly sure why. She plans to return home at d/c.   Pt indicates she is willing to discuss substance use with CSW. She states that she drank brandy yesterday while her daughter took her son to the doctor. She is not sure of the amount. Pt said that prior to yesterday, she had not had any alcohol in 1-2 months. Pt used to drink vodka regularly until a year ago. She just decided at that point to stop and has had very little since then. Her family is not aware that she was drinking yesterday and she indicates that  they would not like it. She states she was just bored and decided to sneak it. Pt wants to remain sober now, but feels she can do so on her own. She does not feel alcohol is a problem for her currently, but admits it was in the past. Pt reports no prior treatment. She accepted outpatient resources and information, but did not feel this is necessary. CSW will sign off, but can be reconsulted if needed.   Employment status:  Disabled (Comment on whether or not currently receiving Disability) Insurance information:  Medicaid [place] PT Recommendations:  Not assessed at this time Information / Referral to community resources:  Outpatient Substance Abuse Treatment Options  Patient/Family's Response to care:  Pt requests to return home at d/c. She accepted outpatient resources, but indicated it was not necessary.   Patient/Family's Understanding of and Emotional Response to Diagnosis, Current Treatment, and Prognosis:  Pt does not appear to have good understanding of her medical history and effects of drinking on health. Pt very vague on amount of alcohol she currently drinks as well as history.   Emotional Assessment Appearance:  Appears older than stated age Attitude/Demeanor/Rapport:  Other (Cooperative) Affect (typically observed):  Quiet Orientation:  Oriented to Self, Oriented to Place,  Oriented to  Time, Oriented to Situation Alcohol / Substance use:  Alcohol Use Psych involvement (Current and /or in the community):  No (Comment)  Discharge Needs  Concerns to be addressed:  Substance Abuse Concerns Readmission within the last 30 days:  No Current discharge risk:  Substance Abuse Barriers to Discharge:  Continued Medical Work up   [employer], [place] 07/26/2014, 8:49 AM [phone]

## 2014-07-26 NOTE — H&P (Signed)
PCP:   Octavio Graves, DO   Chief Complaint:  dizzy  HPI: 59 yo female h/o etoh abuse, gerd, iddm, comes in with several days of dizziness upon standing.  Reports that she gets lightheaded, especially when getting up to move.  No fevers.  No n/v.  Has chronic diarrhea which has not changes.  Pt initially denies drinking any etoh, than after several times of asking she admitted she drank a lot today.  Reports she only drinks about once a week.  No cough.  No abd pain.  No dysuria.  Says she drinks plenty of water a day.  Pt found to have an elevated lactic acid level and orthostatic, asked to admit for ivf.  Pt reports she is feeling better after getting 2 liters ivf in the ED.  Review of Systems:  Positive and negative as per HPI otherwise all other systems are negative  Past Medical History: Past Medical History  Diagnosis Date  . Chronic diarrhea   . History of kidney stones   . Heart murmur   . Neuropathy     Hx: of  . GERD (gastroesophageal reflux disease)   . Diabetes mellitus     fasting blood sugar 110-120s   Past Surgical History  Procedure Laterality Date  . Colonoscopy  02/24/2010  . Tubal ligation    . Multiple extractions with alveoloplasty N/A 06/13/2012    Procedure: MULTIPLE EXTRACION WITH ALVEOLOPLASTY EXTRACT: 18, 19, 20, 21, 22, 24, 25, 27, 28, 29, 30, 31;  Surgeon: Gae Bon, DDS;  Location: [facility];  Service: Oral Surgery;  Laterality: N/A;    Medications: Prior to Admission medications   Medication Sig Start Date End Date Taking? Authorizing Provider  albuterol (PROAIR HFA) 108 (90 BASE) MCG/ACT inhaler Inhale 2 puffs into the lungs every 6 (six) hours as needed for wheezing or shortness of breath.   Yes Historical Provider, MD  atorvastatin (LIPITOR) 40 MG tablet Take 40 mg by mouth daily.   Yes Historical Provider, MD  chlordiazePOXIDE (LIBRIUM) 25 MG capsule Take 1 capsule (25 mg total) by mouth 2 (two) times daily as needed for anxiety. Patient  taking differently: Take 25 mg by mouth 4 (four) times daily -  before meals and at bedtime.  09/20/13  Yes Samuella Cota, MD  doxepin (SINEQUAN) 50 MG capsule Take 50 mg by mouth at bedtime.   Yes Historical Provider, MD  esomeprazole (NEXIUM) 40 MG capsule Take 40 mg by mouth 2 (two) times daily.    Yes Historical Provider, MD  fenofibrate (TRICOR) 145 MG tablet Take 145 mg by mouth daily.   Yes Historical Provider, MD  folic acid (FOLVITE) 1 MG tablet Take 1 mg by mouth daily.   Yes Historical Provider, MD  insulin aspart (NOVOLOG) 100 UNIT/ML FlexPen Inject 3 Units into the skin 3 (three) times daily with meals. 09/20/13  Yes Samuella Cota, MD  insulin glargine (LANTUS) 100 UNIT/ML injection Inject 0.08 mLs (8 Units total) into the skin at bedtime. Patient taking differently: Inject 24 Units into the skin at bedtime.  09/20/13  Yes Samuella Cota, MD  levETIRAcetam (KEPPRA) 1000 MG tablet Take 1 tablet (1,000 mg total) by mouth 2 (two) times daily. 09/20/13  Yes Samuella Cota, MD  lubiprostone (AMITIZA) 8 MCG capsule Take 8 mcg by mouth 2 (two) times daily with a meal.   Yes Historical Provider, MD  magnesium chloride (SLOW-MAG) 64 MG TBEC SR tablet Take 1 tablet by mouth 2 (  two) times daily.   Yes Historical Provider, MD  megestrol (MEGACE) 40 MG tablet Take 40 mg by mouth 2 (two) times daily.   Yes Historical Provider, MD  metFORMIN (GLUCOPHAGE) 500 MG tablet Take 500 mg by mouth daily with supper.   Yes Historical Provider, MD  metoCLOPramide (REGLAN) 5 MG tablet Take 5 mg by mouth 4 (four) times daily.   Yes Historical Provider, MD  metoprolol (LOPRESSOR) 50 MG tablet Take 50 mg by mouth 2 (two) times daily.   Yes Historical Provider, MD  mirtazapine (REMERON) 15 MG tablet Take 15 mg by mouth at bedtime.   Yes Historical Provider, MD  oxyCODONE (ROXICODONE) 15 MG immediate release tablet Take 15 mg by mouth every 12 (twelve) hours as needed for pain.   Yes Historical Provider, MD   PARoxetine (PAXIL) 20 MG tablet Take 20 mg by mouth daily.   Yes Historical Provider, MD  potassium bicarbonate (K-LYTE) 25 MEQ disintegrating tablet Take 25 mEq by mouth daily.    Yes Historical Provider, MD  rOPINIRole (REQUIP) 1 MG tablet Take 1 mg by mouth at bedtime. BETWEEN 7 PM AND 8 PM.   Yes Historical Provider, MD  gentamicin (GARAMYCIN) 0.3 % ophthalmic solution Place 1 drop into both eyes 3 (three) times daily.    Historical Provider, MD  hyoscyamine (LEVSIN, ANASPAZ) 0.125 MG tablet Take 0.125 mg by mouth 4 (four) times daily - after meals and at bedtime.    Historical Provider, MD  lisinopril (PRINIVIL,ZESTRIL) 10 MG tablet Take 10 mg by mouth daily.    Historical Provider, MD  Multiple Vitamin (MULTIVITAMIN WITH MINERALS) TABS tablet Take 1 tablet by mouth daily.    Historical Provider, MD    Allergies:   Allergies  Allergen Reactions  . Aspirin Palpitations    Social History:  reports that she has never smoked. Her smokeless tobacco use includes Snuff. She reports that she drinks alcohol. She reports that she does not use illicit drugs.  Family History: Family History  Problem Relation Age of Onset  . Diabetes Sister     Physical Exam: Filed Vitals:   07/25/14 2300 07/25/14 2323 07/25/14 2330 07/26/14 0000  BP: 154/84  130/75 125/74  Pulse: 115  97 101  Temp:  99 F (37.2 C)    TempSrc:  Rectal    Resp: 17  12 14   Height:      Weight:      SpO2: 100%  100% 99%   General appearance: alert, cooperative and no distress Head: Normocephalic, without obvious abnormality, atraumatic Eyes: negative Nose: Nares normal. Septum midline. Mucosa normal. No drainage or sinus tenderness. Neck: no JVD and supple, symmetrical, trachea midline Lungs: clear to auscultation bilaterally Heart: regular rate and rhythm, S1, S2 normal, no murmur, click, rub or gallop Abdomen: soft, non-tender; bowel sounds normal; no masses,  no organomegaly Extremities: extremities normal,  atraumatic, no cyanosis or edema Pulses: 2+ and symmetric Skin: Skin color, texture, turgor normal. No rashes or lesions Neurologic: Grossly normal    Labs on Admission:   Recent Labs  07/25/14 2230  NA 133*  K 3.4*  CL 99*  CO2 17*  GLUCOSE 202*  BUN 17  CREATININE 0.74  CALCIUM 9.3    Recent Labs  07/25/14 2230  AST 22  ALT 16  ALKPHOS 81  BILITOT 0.4  PROT 7.4  ALBUMIN 4.0    Recent Labs  07/25/14 2230  WBC 7.2  NEUTROABS 4.2  HGB 13.4  HCT 39.5  MCV 88.0  PLT 248    Radiological Exams on Admission: Dg Chest 1 View  07/26/2014   CLINICAL DATA:  Lightheadedness, 1 week duration.  EXAM: CHEST  1 VIEW  COMPARISON:  09/14/2013  FINDINGS: The heart size and mediastinal contours are within normal limits. Both lungs are clear. The visualized skeletal structures are unremarkable.  IMPRESSION: No active disease.   Electronically Signed   By: Andreas Newport M.D.   On: 07/26/2014 01:14   cxr reviewed no infiltrate no edema ekg reviewed- nsr no acute changes Old records reviewed Case discussed with edp dr ward  Assessment/Plan  59 yo female with orthostatic hypotension/dizziness, dehydration, and lactic acidosis   Principal Problem:   Lactic acidosis-  Multifactorial cause.  Clinically think this is due to more dehydration which is probably chronic in the setting of etoh use and metformin, no evidence of underlying infection.  cxr ordered by myself and shows no infiltrate and ua negative, wbc is normal and pt is afebrile.  Repeat lactic level q 3 hours.  Have given another liter bolus of ivf, than will recheck lactic acid level and bmp at 130am.  If pt were to develop fever, leukocytosis would consider infection at that time.  Active Problems:   Diabetes mellitus-  Hold metformin which can also cause lactic acidosis, place on ssi.   Alcohol abuse-  Not sure how forthcoming patient is on the quantity of her etoh usage, give prn ativan po and place on CIWA  protocol, give rally pack.   H/O chronic pancreatitis- stable   Dehydration- as above   Chronic diarrhea-  cdiff pending from ED, doubt cdiff   Orthostatic hypotension-  Repeat orthostatic vitals q shift.  Has received total of 3 liters ivf bolus, on 100cc/hour with kcl replacement at this time.  Observe on telemetry.  Consider discharge when lactic acid level normalizes.  Full code.    Conner Neiss A 07/26/2014, 1:45 AM

## 2014-07-26 NOTE — ED Notes (Addendum)
CRITICAL VALUE ALERT  Critical value received:  Lactic Acid 4.8  Date of notification:  [id number]  Time of notification:  0000  Critical value read back:Yes.    Nurse who received alert:  JRM  MD notified (1st page): Ward  Time of first page:  0000  MD notified (2nd page):  Time of second page:  Responding MD:  Ward  Time MD responded:  0000

## 2014-07-27 DIAGNOSIS — E872 Acidosis: Secondary | ICD-10-CM | POA: Diagnosis not present

## 2014-07-27 DIAGNOSIS — E1140 Type 2 diabetes mellitus with diabetic neuropathy, unspecified: Secondary | ICD-10-CM | POA: Diagnosis not present

## 2014-07-27 DIAGNOSIS — F1010 Alcohol abuse, uncomplicated: Secondary | ICD-10-CM | POA: Diagnosis not present

## 2014-07-27 LAB — GLUCOSE, CAPILLARY: Glucose-Capillary: 206 mg/dL — ABNORMAL HIGH (ref 65–99)

## 2014-07-27 MED ORDER — INSULIN GLARGINE 100 UNIT/ML ~~LOC~~ SOLN
15.0000 [IU] | Freq: Every day | SUBCUTANEOUS | Status: DC
  Administered 2014-07-27: 15 [IU] via SUBCUTANEOUS
  Filled 2014-07-27 (×4): qty 0.15

## 2014-07-27 MED ORDER — THIAMINE HCL 100 MG PO TABS: 100.0000 mg | ORAL_TABLET | Freq: Every day | ORAL | Status: DC

## 2014-07-27 MED ORDER — LEVETIRACETAM 500 MG PO TABS
1000.0000 mg | ORAL_TABLET | Freq: Two times a day (BID) | ORAL | Status: DC
  Administered 2014-07-27: 1000 mg via ORAL
  Filled 2014-07-27: qty 2

## 2014-07-27 MED ORDER — INSULIN GLARGINE 100 UNIT/ML ~~LOC~~ SOLN: 24.0000 [IU] | Freq: Every day | SUBCUTANEOUS | Status: DC

## 2014-07-27 NOTE — Care Management Note (Signed)
Case Management Note  Patient Details  Name: Erica [name] MRN: [id number] Date of Birth: [birth year]  Subjective/Objective:                  Pt admitted from home with weakness. Pt lives with her son and her daughter is her CAP aide. Pt has a cane and walker for home use.   Action/Plan: PT recommends outpt PT but pt is Medicaid. No further CM needs noted.  Expected Discharge Date:07/27/14               Expected Discharge Plan:  Home/Self Care  In-House Referral:  Clinical Social Work  Discharge planning Services  CM Consult  Post Acute Care Choice:  NA Choice offered to:  NA  DME Arranged:    DME Agency:     HH Arranged:    [place] Agency:     Status of Service:  Completed, signed off  Medicare Important Message Given:    Date Medicare IM Given:    Medicare IM give by:    Date Additional Medicare IM Given:    Additional Medicare Important Message give by:     If discussed at [place] of Stay Meetings, dates discussed:    Additional Comments:  Joylene Draft, RN 07/27/2014, 8:26 AM

## 2014-07-27 NOTE — Progress Notes (Signed)
Pt's IV catheter removed and intact. Pt's IV site clean dry and intact. Discharge instructions and medications reviewed and discussed with patient's son. All questions were answered and no further questions at this time. Pt in stable condition and no acute distress at this time. Pt escorted by nurse tech.

## 2014-07-27 NOTE — Discharge Summary (Signed)
Physician Discharge Summary  Erica [name] [id number] DOB: 1955/12/17 DOA: 07/25/2014  PCP: Octavio Graves, DO  Admit date: 07/25/2014 Discharge date: 07/27/2014  Time spent: 35 minutes  Recommendations for Outpatient Follow-up:  1. Polypharmacy- take away any meds not needed 2. Advised to avoid alcohol 3. Took off metformin due to lactic acidosis  Discharge Diagnoses:  Principal Problem:   Lactic acidosis Active Problems:   Diabetes mellitus   Alcohol abuse   H/O chronic pancreatitis   Dehydration   Chronic diarrhea   Orthostatic hypotension   Discharge Condition: improved  Diet recommendation: cardiac/diabetic  Filed Weights   07/25/14 2126 07/26/14 0600  Weight: 45.36 kg (100 lb) 47.9 kg (105 lb 9.6 oz)    History of present illness:  59 yo female h/o etoh abuse, gerd, iddm, comes in with several days of dizziness upon standing. Reports that she gets lightheaded, especially when getting up to move. No fevers. No n/v. Has chronic diarrhea which has not changes. Pt initially denies drinking any etoh, than after several times of asking she admitted she drank a lot today. Reports she only drinks about once a week. No cough. No abd pain. No dysuria. Says she drinks plenty of water a day. Pt found to have an elevated lactic acid level and orthostatic, asked to admit for ivf. Pt reports she is feeling better after getting 2 liters ivf in the ED.   Hospital Course:  Lactic acidosis-  -probably due to more dehydration which is probably chronic in the setting of etoh use and metformin, no evidence of underlying infection.  -cxr ordered shows no infiltrate and ua negative, wbc is normal and pt is afebrile.  -lactic acid normal   Diabetes mellitus- d/c metformin   Alcohol abuse-  -CIWA -social work offered treatment- patient declined   H/O chronic pancreatitis- stable  Dehydration- as above  Orthostatic hypotension-  resolved  Outpatient  PT  Procedures:    Consultations:    Discharge Exam: Filed Vitals:   07/27/14 0523  BP: 135/73  Pulse: 85  Temp: 98.7 F (37.1 C)  Resp: 16    General: A+Ox3, no further dizziness Cardiovascular: rrr Respiratory: clear  Discharge Instructions   Discharge Instructions    Diet - low sodium heart healthy    Complete by:  As directed      Diet Carb Modified    Complete by:  As directed      Discharge instructions    Complete by:  As directed   No more alcohol Check blood sugars once daily (alternate between before breakfast and 2 hours after a meal) and bring to PCP     Increase activity slowly    Complete by:  As directed           Current Discharge Medication List    START taking these medications   Details  thiamine 100 MG tablet Take 1 tablet (100 mg total) by mouth daily. Qty: 30 tablet, Refills: 0      CONTINUE these medications which have CHANGED   Details  insulin glargine (LANTUS) 100 UNIT/ML injection Inject 0.24 mLs (24 Units total) into the skin at bedtime. Qty: 10 mL, Refills: 11      CONTINUE these medications which have NOT CHANGED   Details  albuterol (PROAIR HFA) 108 (90 BASE) MCG/ACT inhaler Inhale 2 puffs into the lungs every 6 (six) hours as needed for wheezing or shortness of breath.    ALPRAZolam (XANAX) 0.5 MG tablet Take 0.5 mg by  mouth at bedtime as needed for anxiety.    atorvastatin (LIPITOR) 40 MG tablet Take 40 mg by mouth daily.    chlordiazePOXIDE (LIBRIUM) 25 MG capsule Take 1 capsule (25 mg total) by mouth 2 (two) times daily as needed for anxiety.    diphenoxylate-atropine (LOMOTIL) 2.5-0.025 MG per tablet Take 1 tablet by mouth 4 (four) times daily as needed for diarrhea or loose stools.    doxepin (SINEQUAN) 50 MG capsule Take 50 mg by mouth at bedtime.    esomeprazole (NEXIUM) 40 MG capsule Take 40 mg by mouth 2 (two) times daily.     fenofibrate (TRICOR) 145 MG tablet Take 145 mg by mouth daily.    folic acid  (FOLVITE) 1 MG tablet Take 1 mg by mouth daily.    insulin aspart (NOVOLOG) 100 UNIT/ML FlexPen Inject 3 Units into the skin 3 (three) times daily with meals. Qty: 3 mL, Refills: 0    levETIRAcetam (KEPPRA) 1000 MG tablet Take 1 tablet (1,000 mg total) by mouth 2 (two) times daily. Qty: 60 tablet, Refills: 0    lubiprostone (AMITIZA) 8 MCG capsule Take 8 mcg by mouth 2 (two) times daily with a meal.    magnesium chloride (SLOW-MAG) 64 MG TBEC SR tablet Take 1 tablet by mouth 2 (two) times daily.    megestrol (MEGACE) 40 MG tablet Take 40 mg by mouth 2 (two) times daily.    metoCLOPramide (REGLAN) 5 MG tablet Take 5 mg by mouth 4 (four) times daily.    metoprolol (LOPRESSOR) 50 MG tablet Take 50 mg by mouth 2 (two) times daily.    mirtazapine (REMERON) 15 MG tablet Take 15 mg by mouth at bedtime.    oxyCODONE (ROXICODONE) 15 MG immediate release tablet Take 15 mg by mouth every 12 (twelve) hours as needed for pain.    PARoxetine (PAXIL) 20 MG tablet Take 20 mg by mouth daily.    potassium bicarbonate (K-LYTE) 25 MEQ disintegrating tablet Take 25 mEq by mouth daily.     rOPINIRole (REQUIP) 1 MG tablet Take 1 mg by mouth at bedtime. BETWEEN 7 PM AND 8 PM.    gentamicin (GARAMYCIN) 0.3 % ophthalmic solution Place 1 drop into both eyes 3 (three) times daily.    hyoscyamine (LEVSIN, ANASPAZ) 0.125 MG tablet Take 0.125 mg by mouth 4 (four) times daily - after meals and at bedtime.    lisinopril (PRINIVIL,ZESTRIL) 10 MG tablet Take 10 mg by mouth daily.    Multiple Vitamin (MULTIVITAMIN WITH MINERALS) TABS tablet Take 1 tablet by mouth daily.      STOP taking these medications     metFORMIN (GLUCOPHAGE) 500 MG tablet        Allergies  Allergen Reactions  . Aspirin Palpitations      The results of significant diagnostics from this hospitalization (including imaging, microbiology, ancillary and laboratory) are listed below for reference.    Significant Diagnostic  Studies: Dg Chest 1 View  07/26/2014   CLINICAL DATA:  Lightheadedness, 1 week duration.  EXAM: CHEST  1 VIEW  COMPARISON:  09/14/2013  FINDINGS: The heart size and mediastinal contours are within normal limits. Both lungs are clear. The visualized skeletal structures are unremarkable.  IMPRESSION: No active disease.   Electronically Signed   By: Andreas Newport M.D.   On: 07/26/2014 01:14    Microbiology: Recent Results (from the past 240 hour(s))  Blood culture (routine x 2)     Status: None (Preliminary result)   Collection Time: 07/25/14  11:20 PM  Result Value Ref Range Status   Specimen Description BLOOD  Final   Special Requests NONE  Final   Culture NO GROWTH 1 DAY  Final   Report Status PENDING  Incomplete  Blood culture (routine x 2)     Status: None (Preliminary result)   Collection Time: 07/25/14 11:30 PM  Result Value Ref Range Status   Specimen Description BLOOD RIGHT HAND  Final   Special Requests BOTTLES DRAWN AEROBIC ONLY 4CC  Final   Culture NO GROWTH 1 DAY  Final   Report Status PENDING  Incomplete     Labs: Basic Metabolic Panel:  Recent Labs Lab 07/25/14 2230 07/26/14 0114 07/26/14 0411  NA 133* 136 139  K 3.4* 3.4* 3.8  CL 99* 108 108  CO2 17* 17* 21*  GLUCOSE 202* 192* 231*  BUN 17 15 14   CREATININE 0.74 0.70 0.65  CALCIUM 9.3 8.2* 8.6*   Liver Function Tests:  Recent Labs Lab 07/25/14 2230  AST 22  ALT 16  ALKPHOS 81  BILITOT 0.4  PROT 7.4  ALBUMIN 4.0   No results for input(s): LIPASE, AMYLASE in the last 168 hours. No results for input(s): AMMONIA in the last 168 hours. CBC:  Recent Labs Lab 07/25/14 2230 07/26/14 0411  WBC 7.2 7.8  NEUTROABS 4.2  --   HGB 13.4 12.0  HCT 39.5 35.5*  MCV 88.0 88.5  PLT 248 190   Cardiac Enzymes: No results for input(s): CKTOTAL, CKMB, CKMBINDEX, TROPONINI in the last 168 hours. BNP: BNP (last 3 results) No results for input(s): BNP in the last 8760 hours.  ProBNP (last 3 results) No  results for input(s): PROBNP in the last 8760 hours.  CBG:  Recent Labs Lab 07/26/14 0741 07/26/14 1111 07/26/14 1631 07/26/14 2204 07/27/14 0751  GLUCAP 202* 289* 353* 307* 206*       Signed:  Saryah Loper  [facility] 07/27/2014, 8:15 AM

## 2014-07-29 LAB — URINE CULTURE: Colony Count: >=100000

## 2014-07-30 LAB — CULTURE, BLOOD (ROUTINE X 2)
Culture: NO GROWTH
Culture: NO GROWTH

## 2014-08-13 ENCOUNTER — Other Ambulatory Visit: Payer: Self-pay

## 2015-03-12 ENCOUNTER — Encounter (HOSPITAL_COMMUNITY)
Admission: RE | Admit: 2015-03-12 | Discharge: 2015-03-12 | Disposition: A | Discharge status: Home / Self Care | Payer: Medicaid Other | Source: Ambulatory Visit | Attending: Ophthalmology | Admitting: Ophthalmology

## 2015-03-12 NOTE — Patient Instructions (Signed)
Your procedure is scheduled on: 03/14/2015   Report to [facility] at  68  AM.  Call this number if you have problems the morning of surgery: [phone]   Do not eat food or drink liquids :After Midnight.      Take these medicines the morning of surgery with A SIP OF WATER: xanax, librium, nexium, keppra,lisinopril, reglan, metoprolol, pxycodone, paxil. Take your inhaler before you come and bring it with you. Only take 1/2 of your usual insulin dosage the night before your surgery. DO NOT take any medicine for diabetes the morning of surgery.   Do not wear jewelry, make-up or nail polish.  Do not wear lotions, powders, or perfumes. You may wear deodorant.  Do not shave 48 hours prior to surgery.  Do not bring valuables to the hospital.  Contacts, dentures or bridgework may not be worn into surgery.  Leave suitcase in the car. After surgery it may be brought to your room.  For patients admitted to the hospital, checkout time is 11:00 AM the day of discharge.   Patients discharged the day of surgery will not be allowed to drive home.  :     Please read over the following fact sheets that you were given: Coughing and Deep Breathing, Surgical Site Infection Prevention, Anesthesia Post-op Instructions and Care and Recovery After Surgery    Cataract A cataract is a clouding of the lens of the eye. When a lens becomes cloudy, vision is reduced based on the degree and nature of the clouding. Many cataracts reduce vision to some degree. Some cataracts make people more near-sighted as they develop. Other cataracts increase glare. Cataracts that are ignored and become worse can sometimes look white. The white color can be seen through the pupil. CAUSES   Aging. However, cataracts may occur at any age, even in newborns.   Certain drugs.   Trauma to the eye.   Certain diseases such as diabetes.   Specific eye diseases such as chronic inflammation inside the eye or a sudden attack of a rare form  of glaucoma.   Inherited or acquired medical problems.  SYMPTOMS   Gradual, progressive drop in vision in the affected eye.   Severe, rapid visual loss. This most often happens when trauma is the cause.  DIAGNOSIS  To detect a cataract, an eye doctor examines the lens. Cataracts are best diagnosed with an exam of the eyes with the pupils enlarged (dilated) by drops.  TREATMENT  For an early cataract, vision may improve by using different eyeglasses or stronger lighting. If that does not help your vision, surgery is the only effective treatment. A cataract needs to be surgically removed when vision loss interferes with your everyday activities, such as driving, reading, or watching TV. A cataract may also have to be removed if it prevents examination or treatment of another eye problem. Surgery removes the cloudy lens and usually replaces it with a substitute lens (intraocular lens, IOL).  At a time when both you and your doctor agree, the cataract will be surgically removed. If you have cataracts in both eyes, only one is usually removed at a time. This allows the operated eye to heal and be out of danger from any possible problems after surgery (such as infection or poor wound healing). In rare cases, a cataract may be doing damage to your eye. In these cases, your caregiver may advise surgical removal right away. The vast majority of people who have cataract surgery have better  vision afterward. HOME CARE INSTRUCTIONS  If you are not planning surgery, you may be asked to do the following:  Use different eyeglasses.   Use stronger or brighter lighting.   Ask your eye doctor about reducing your medicine dose or changing medicines if it is thought that a medicine caused your cataract. Changing medicines does not make the cataract go away on its own.   Become familiar with your surroundings. Poor vision can lead to injury. Avoid bumping into things on the affected side. You are at a higher risk  for tripping or falling.   Exercise extreme care when driving or operating machinery.   Wear sunglasses if you are sensitive to bright light or experiencing problems with glare.  SEEK IMMEDIATE MEDICAL CARE IF:   You have a worsening or sudden vision loss.   You notice redness, swelling, or increasing pain in the eye.   You have a fever.  Document Released: 02/02/2005 Document Revised: 01/22/2011 Document Reviewed: 09/26/2010 [facility] Patient Information 2012 [place].PATIENT INSTRUCTIONS POST-ANESTHESIA  IMMEDIATELY FOLLOWING SURGERY:  Do not drive or operate machinery for the first twenty four hours after surgery.  Do not make any important decisions for twenty four hours after surgery or while taking narcotic pain medications or sedatives.  If you develop intractable nausea and vomiting or a severe headache please notify your doctor immediately.  FOLLOW-UP:  Please make an appointment with your surgeon as instructed. You do not need to follow up with anesthesia unless specifically instructed to do so.  WOUND CARE INSTRUCTIONS (if applicable):  Keep a dry clean dressing on the anesthesia/puncture wound site if there is drainage.  Once the wound has quit draining you may leave it open to air.  Generally you should leave the bandage intact for twenty four hours unless there is drainage.  If the epidural site drains for more than 36-48 hours please call the anesthesia department.  QUESTIONS?:  Please feel free to call your physician or the hospital operator if you have any questions, and they will be happy to assist you.

## 2015-03-13 ENCOUNTER — Encounter (HOSPITAL_COMMUNITY)
Admission: RE | Admit: 2015-03-13 | Discharge: 2015-03-13 | Disposition: A | Discharge status: Home / Self Care | Payer: Medicaid Other | Source: Ambulatory Visit | Attending: Ophthalmology | Admitting: Ophthalmology

## 2015-03-13 ENCOUNTER — Encounter (HOSPITAL_COMMUNITY): Payer: Self-pay

## 2015-03-13 DIAGNOSIS — H269 Unspecified cataract: Secondary | ICD-10-CM | POA: Insufficient documentation

## 2015-03-13 DIAGNOSIS — Z01812 Encounter for preprocedural laboratory examination: Secondary | ICD-10-CM | POA: Insufficient documentation

## 2015-03-13 HISTORY — DX: Vitamin D deficiency, unspecified: E55.9

## 2015-03-13 HISTORY — DX: Deficiency of other specified B group vitamins: E53.8

## 2015-03-13 HISTORY — DX: Depression, unspecified: F32.A

## 2015-03-13 HISTORY — DX: Alcohol induced acute pancreatitis without necrosis or infection: K85.20

## 2015-03-13 HISTORY — DX: Hyperlipidemia, unspecified: E78.5

## 2015-03-13 HISTORY — DX: Other muscle spasm: M62.838

## 2015-03-13 HISTORY — DX: Hypokalemia: E87.6

## 2015-03-13 HISTORY — DX: Acute pancreatitis without necrosis or infection, unspecified: K85.90

## 2015-03-13 HISTORY — DX: Essential (primary) hypertension: I10

## 2015-03-13 HISTORY — DX: Unspecified convulsions: R56.9

## 2015-03-13 HISTORY — DX: Neuralgia and neuritis, unspecified: M79.2

## 2015-03-13 HISTORY — DX: Gastroparesis: K31.84

## 2015-03-13 HISTORY — DX: Major depressive disorder, single episode, unspecified: F32.9

## 2015-03-13 LAB — CBC WITH DIFFERENTIAL/PLATELET
Basophils Absolute: 0 10*3/uL (ref 0.0–0.1)
Basophils Relative: 0 %
EOS ABS: 0.4 10*3/uL (ref 0.0–0.7)
EOS PCT: 4 %
HCT: 41.3 % (ref 36.0–46.0)
Hemoglobin: 14.2 g/dL (ref 12.0–15.0)
LYMPHS ABS: 2.1 10*3/uL (ref 0.7–4.0)
LYMPHS PCT: 23 %
MCH: 30.9 pg (ref 26.0–34.0)
MCHC: 34.4 g/dL (ref 30.0–36.0)
MCV: 89.8 fL (ref 78.0–100.0)
MONO ABS: 0.5 10*3/uL (ref 0.1–1.0)
MONOS PCT: 6 %
Neutro Abs: 6 10*3/uL (ref 1.7–7.7)
Neutrophils Relative %: 67 %
PLATELETS: 167 10*3/uL (ref 150–400)
RBC: 4.6 MIL/uL (ref 3.87–5.11)
RDW: 12.1 % (ref 11.5–15.5)
WBC: 9 10*3/uL (ref 4.0–10.5)

## 2015-03-13 LAB — BASIC METABOLIC PANEL
Anion gap: 9 (ref 5–15)
BUN: 8 mg/dL (ref 6–20)
CHLORIDE: 106 mmol/L (ref 101–111)
CO2: 23 mmol/L (ref 22–32)
CREATININE: 0.77 mg/dL (ref 0.44–1.00)
Calcium: 9.4 mg/dL (ref 8.9–10.3)
GFR calc Af Amer: >60 mL/min (ref >60)
GFR calc non Af Amer: >60 mL/min (ref >60)
GLUCOSE: 148 mg/dL — AB (ref 65–99)
POTASSIUM: 4.4 mmol/L (ref 3.5–5.1)
SODIUM: 138 mmol/L (ref 135–145)

## 2015-03-13 MED ORDER — LIDOCAINE HCL 3.5 % OP GEL
OPHTHALMIC | Status: AC
  Filled 2015-03-13: qty 1

## 2015-03-13 MED ORDER — CYCLOPENTOLATE-PHENYLEPHRINE OP SOLN OPTIME - NO CHARGE
OPHTHALMIC | Status: AC
  Filled 2015-03-13: qty 2

## 2015-03-13 MED ORDER — NEOMYCIN-POLYMYXIN-DEXAMETH 3.5-10000-0.1 OP SUSP
OPHTHALMIC | Status: AC
  Filled 2015-03-13: qty 5

## 2015-03-13 MED ORDER — PHENYLEPHRINE HCL 2.5 % OP SOLN
OPHTHALMIC | Status: AC
  Filled 2015-03-13: qty 15

## 2015-03-13 MED ORDER — TETRACAINE HCL 0.5 % OP SOLN
OPHTHALMIC | Status: AC
  Filled 2015-03-13: qty 4

## 2015-03-13 MED ORDER — LIDOCAINE HCL (PF) 1 % IJ SOLN
INTRAMUSCULAR | Status: AC
Start: 2015-03-13 — End: 2015-03-13
  Filled 2015-03-13: qty 2

## 2015-03-13 NOTE — Patient Instructions (Signed)
Patient given information to sign up for my chart at home. 

## 2015-03-14 ENCOUNTER — Ambulatory Visit (HOSPITAL_COMMUNITY): Payer: Medicaid Other | Admitting: Anesthesiology

## 2015-03-14 ENCOUNTER — Ambulatory Visit (HOSPITAL_COMMUNITY)
Admission: RE | Admit: 2015-03-14 | Discharge: 2015-03-14 | Disposition: A | Discharge status: Home / Self Care | Payer: Medicaid Other | Source: Ambulatory Visit | Attending: Ophthalmology | Admitting: Ophthalmology

## 2015-03-14 ENCOUNTER — Encounter (HOSPITAL_COMMUNITY): Payer: Self-pay | Admitting: *Deleted

## 2015-03-14 ENCOUNTER — Encounter (HOSPITAL_COMMUNITY)
Admission: RE | Disposition: A | Discharge status: Home / Self Care | Payer: Self-pay | Source: Ambulatory Visit | Attending: Ophthalmology

## 2015-03-14 DIAGNOSIS — F329 Major depressive disorder, single episode, unspecified: Secondary | ICD-10-CM | POA: Diagnosis not present

## 2015-03-14 DIAGNOSIS — Z79899 Other long term (current) drug therapy: Secondary | ICD-10-CM | POA: Diagnosis not present

## 2015-03-14 DIAGNOSIS — I252 Old myocardial infarction: Secondary | ICD-10-CM | POA: Insufficient documentation

## 2015-03-14 DIAGNOSIS — H269 Unspecified cataract: Secondary | ICD-10-CM | POA: Insufficient documentation

## 2015-03-14 DIAGNOSIS — I10 Essential (primary) hypertension: Secondary | ICD-10-CM | POA: Insufficient documentation

## 2015-03-14 DIAGNOSIS — K219 Gastro-esophageal reflux disease without esophagitis: Secondary | ICD-10-CM | POA: Insufficient documentation

## 2015-03-14 DIAGNOSIS — J449 Chronic obstructive pulmonary disease, unspecified: Secondary | ICD-10-CM | POA: Diagnosis not present

## 2015-03-14 DIAGNOSIS — Z7984 Long term (current) use of oral hypoglycemic drugs: Secondary | ICD-10-CM | POA: Insufficient documentation

## 2015-03-14 HISTORY — PX: CATARACT EXTRACTION W/PHACO: SHX586

## 2015-03-14 LAB — GLUCOSE, CAPILLARY: Glucose-Capillary: 187 mg/dL — ABNORMAL HIGH (ref 65–99)

## 2015-03-14 SURGERY — PHACOEMULSIFICATION, CATARACT, WITH IOL INSERTION
Anesthesia: Monitor Anesthesia Care | Site: Eye | Laterality: Right

## 2015-03-14 MED ORDER — EPINEPHRINE HCL 1 MG/ML IJ SOLN
INTRAMUSCULAR | Status: AC
  Filled 2015-03-14: qty 1

## 2015-03-14 MED ORDER — CYCLOPENTOLATE-PHENYLEPHRINE 0.2-1 % OP SOLN
1.0000 [drp] | OPHTHALMIC | Status: AC
  Administered 2015-03-14 (×3): 1 [drp] via OPHTHALMIC

## 2015-03-14 MED ORDER — NEOMYCIN-POLYMYXIN-DEXAMETH 3.5-10000-0.1 OP SUSP
OPHTHALMIC | Status: DC | PRN
  Administered 2015-03-14: 2 [drp] via OPHTHALMIC

## 2015-03-14 MED ORDER — POVIDONE-IODINE 5 % OP SOLN
OPHTHALMIC | Status: DC | PRN
  Administered 2015-03-14: 1 via OPHTHALMIC

## 2015-03-14 MED ORDER — PHENYLEPHRINE HCL 2.5 % OP SOLN
1.0000 [drp] | OPHTHALMIC | Status: AC
  Administered 2015-03-14 (×3): 1 [drp] via OPHTHALMIC

## 2015-03-14 MED ORDER — EPINEPHRINE HCL 1 MG/ML IJ SOLN
INTRAOCULAR | Status: DC | PRN
  Administered 2015-03-14: 500 mL

## 2015-03-14 MED ORDER — LACTATED RINGERS IV SOLN
INTRAVENOUS | Status: DC
  Administered 2015-03-14: 1000 mL via INTRAVENOUS

## 2015-03-14 MED ORDER — MIDAZOLAM HCL 2 MG/2ML IJ SOLN
1.0000 mg | INTRAMUSCULAR | Status: DC | PRN
  Administered 2015-03-14: 2 mg via INTRAVENOUS

## 2015-03-14 MED ORDER — LIDOCAINE HCL 3.5 % OP GEL
1.0000 "application " | Freq: Once | OPHTHALMIC | Status: AC
  Administered 2015-03-14: 1 via OPHTHALMIC

## 2015-03-14 MED ORDER — PROVISC 10 MG/ML IO SOLN
INTRAOCULAR | Status: DC | PRN
  Administered 2015-03-14: .85 mL via INTRAOCULAR

## 2015-03-14 MED ORDER — MIDAZOLAM HCL 2 MG/2ML IJ SOLN
INTRAMUSCULAR | Status: AC
  Filled 2015-03-14: qty 2

## 2015-03-14 MED ORDER — BSS IO SOLN
INTRAOCULAR | Status: DC | PRN
  Administered 2015-03-14: 15 mL

## 2015-03-14 MED ORDER — LIDOCAINE HCL 3.5 % OP GEL
OPHTHALMIC | Status: DC | PRN
  Administered 2015-03-14: 1 via OPHTHALMIC

## 2015-03-14 MED ORDER — EPINEPHRINE HCL 1 MG/ML IJ SOLN
INTRAMUSCULAR | Status: DC | PRN
  Administered 2015-03-14: 1 mL via OPHTHALMIC

## 2015-03-14 MED ORDER — SODIUM HYALURONATE 23 MG/ML IO SOLN
INTRAOCULAR | Status: DC | PRN
  Administered 2015-03-14: 0.6 mL via INTRAOCULAR

## 2015-03-14 MED ORDER — TETRACAINE HCL 0.5 % OP SOLN
1.0000 [drp] | OPHTHALMIC | Status: AC
  Administered 2015-03-14 (×3): 1 [drp] via OPHTHALMIC

## 2015-03-14 SURGICAL SUPPLY — 13 items
CLOTH BEACON ORANGE TIMEOUT ST (SAFETY) ×1 IMPLANT
EYE SHIELD UNIVERSAL CLEAR (GAUZE/BANDAGES/DRESSINGS) ×2 IMPLANT
GLOVE BIOGEL PI IND STRL 7.0 (GLOVE) ×2 IMPLANT
GLOVE BIOGEL PI INDICATOR 7.0 (GLOVE) ×2
GLOVE EXAM NITRILE MD LF STRL (GLOVE) ×1 IMPLANT
GOWN STRL REUS W/TWL LRG LVL3 (GOWN DISPOSABLE) ×1 IMPLANT
PAD ARMBOARD 7.5X6 YLW CONV (MISCELLANEOUS) ×1 IMPLANT
SIGHTPATH CAT PROC W REG LENS (Ophthalmic Related) ×1 IMPLANT
SYR TB 1ML LL NO SAFETY (SYRINGE) ×2 IMPLANT
TAPE SURG TRANSPORE 1 IN (GAUZE/BANDAGES/DRESSINGS) IMPLANT
TAPE SURGICAL TRANSPORE 1 IN (GAUZE/BANDAGES/DRESSINGS) ×1
VISCOELASTIC ADDITIONAL (OPHTHALMIC RELATED) ×2 IMPLANT
WATER STERILE IRR 250ML POUR (IV SOLUTION) ×2 IMPLANT

## 2015-03-14 NOTE — Transfer of Care (Signed)
Immediate Anesthesia Transfer of Care Note  Patient: Erica [name]  Procedure(s) Performed: Procedure(s) with comments: CATARACT EXTRACTION PHACO AND INTRAOCULAR LENS PLACEMENT (IOC) (Right) - CDE:11.13  Patient Location: Short Stay  Anesthesia Type:MAC  Level of Consciousness: awake, alert , oriented and patient cooperative  Airway & Oxygen Therapy: Patient Spontanous Breathing  Post-op Assessment: Report given to RN and Post -op Vital signs reviewed and stable  Post vital signs: Reviewed and stable  Last Vitals:  Filed Vitals:   03/14/15 0720 03/14/15 0725  BP: 107/71 100/68  Pulse:    Temp:    Resp: 13 13    Complications: No apparent anesthesia complications

## 2015-03-14 NOTE — H&P (Signed)
The H and P was reviewed and updated.  No changes were found after exam.  The surgical eye was marked.  

## 2015-03-14 NOTE — Discharge Instructions (Signed)

## 2015-03-14 NOTE — Anesthesia Procedure Notes (Addendum)
Procedure Name: MAC Date/Time: 03/14/2015 7:27 AM Performed by: Andree Elk, AMY A Pre-anesthesia Checklist: Patient identified, Timeout performed, Emergency Drugs available, Suction available and Patient being monitored Oxygen Delivery Method: Nasal cannula

## 2015-03-14 NOTE — Anesthesia Preprocedure Evaluation (Addendum)
Anesthesia Evaluation  Patient identified by MRN, date of birth, ID band Patient awake    Reviewed: Allergy & Precautions, NPO status , Patient's Chart, lab work & pertinent test results, reviewed documented beta blocker date and time   Airway Mallampati: III  TM Distance: >3 FB Neck ROM: Full    Dental  (+) Edentulous Upper, Edentulous Lower, Lower Dentures, Upper Dentures   Pulmonary pneumonia, resolved, COPD,    Pulmonary exam normal        Cardiovascular hypertension, Pt. on medications and Pt. on home beta blockers + Past MI  Normal cardiovascular exam     Neuro/Psych Seizures -,  Depression    GI/Hepatic GERD  Medicated and Controlled,  Endo/Other  diabetes, Poorly Controlled, Type 2, Oral Hypoglycemic Agents  Renal/GU      Musculoskeletal   Abdominal Normal abdominal exam  (+)   Peds  Hematology   Anesthesia Other Findings   Reproductive/Obstetrics                            Anesthesia Physical Anesthesia Plan  ASA: III  Anesthesia Plan: MAC   Post-op Pain Management:    Induction: Intravenous  Airway Management Planned: Nasal Cannula  Additional Equipment:   Intra-op Plan:   Post-operative Plan:   Informed Consent: I have reviewed the patients History and Physical, chart, labs and discussed the procedure including the risks, benefits and alternatives for the proposed anesthesia with the patient or authorized representative who has indicated his/her understanding and acceptance.   Dental advisory given  Plan Discussed with: CRNA  Anesthesia Plan Comments:         Anesthesia Quick Evaluation

## 2015-03-14 NOTE — Anesthesia Postprocedure Evaluation (Signed)
Anesthesia Post Note  Patient: Erica [name]  Procedure(s) Performed: Procedure(s) (LRB): CATARACT EXTRACTION PHACO AND INTRAOCULAR LENS PLACEMENT (IOC) (Right)  Patient location during evaluation: Short Stay Anesthesia Type: MAC Level of consciousness: awake and alert and oriented Pain management: pain level controlled Vital Signs Assessment: post-procedure vital signs reviewed and stable Respiratory status: spontaneous breathing and respiratory function stable Cardiovascular status: stable Postop Assessment: no signs of nausea or vomiting Anesthetic complications: no    Last Vitals:  Filed Vitals:   03/14/15 0720 03/14/15 0725  BP: 107/71 100/68  Pulse:    Temp:    Resp: 13 13    Last Pain: There were no vitals filed for this visit.               ADAMS, AMY A

## 2015-03-14 NOTE — Op Note (Signed)
Date of procedure: 03/14/2015  Pre-operative diagnosis: Visually significant cataract, Right Eye  Post-operative diagnosis: Visually significant cataract, Right Eye  Procedure: Removal of cataract via phacoemulsification and insertion of intra-ocular lens Hoya 250 +22.0D into the capsular bag of the Right Eye  Attending surgeon: Gerda Diss. Ettore Trebilcock, MD, MA  Anesthesia: MAC, Topical Akten  Complications: None  Estimated Blood Loss: <75mL (minimal)  Specimens: None  Implants: As above  Indications:  Visually significant cataract, Right Eye  Procedure:  The patient was seen and identified in the pre-operative area. The operative eye was identified and dilated.  The operative eye was marked.  Topical anesthesia was administered to the operative eye.     The patient was then to the operative suite and placed in the supine position.  A timeout was performed confirming the patient, procedure to be performed, and all other relevant information.   The patient's face was prepped and draped in the usual fashion for intra-ocular surgery.  A lid speculum was placed into the operative eye and the surgical microscope moved into place and focused.  A superotemporal paracentesis was created using a 15 degree blade.  Shugarcaine was injected into the anterior chamber.  Viscoelastic was injected into the anterior chamber.  A temporal clear-corneal main wound incision was created using a 2.76mm microkeratome.  A continuous curvilinear capsulorrhexis was initiated using an irrigating cystitome and completed using capsulorrhexis forceps.  Hydrodissection and hydrodeliniation were performed.  Viscoelastic was injected into the anterior chamber.  A phacoemulsification handpiece and a chopper as a second instrument were used to remove the nucleus and epinucleus. The irrigation/aspiration handpiece was used to remove any remaining cortical material.   The capsular bag was reinflated with viscoelastic, checked, and found  to be intact. A Hoya model 250 lens was inserted into the capsular bag and dialed into place using a Sinskey hook.  The irrigation/aspiration handpiece was used to remove any remaining viscoelastic. Miochol was injected into the eye.  The clear corneal wound and paracentesis wounds were then hydrated and checked with Weck-Cels to be watertight.  The lid-speculum and drape was removed, and the patient's face was cleaned with a wet and dry 4x4.  Vigamox was instilled in the eye before a clear shield was taped over the eye. The patient was taken to the post-operative care unit in good condition, having tolerated the procedure well.  Post-Op Instructions: The patient will follow up at [facility] for a same day post-operative evaluation and will receive all other orders and instructions.

## 2015-03-14 NOTE — Progress Notes (Signed)
Spoke to patient's son. Patient doing fine since surgery. Patient does not drive. Told him she should not sign any documents or use stove or equipment today. Verbalized understanding.

## 2015-03-15 ENCOUNTER — Encounter (HOSPITAL_COMMUNITY): Payer: Self-pay | Admitting: Ophthalmology

## 2015-04-08 ENCOUNTER — Encounter (HOSPITAL_COMMUNITY): Payer: Self-pay

## 2015-04-08 ENCOUNTER — Encounter (HOSPITAL_COMMUNITY)
Admission: RE | Admit: 2015-04-08 | Discharge: 2015-04-08 | Disposition: A | Discharge status: Home / Self Care | Payer: Medicaid Other | Source: Ambulatory Visit | Attending: Ophthalmology | Admitting: Ophthalmology

## 2015-04-10 MED ORDER — LIDOCAINE HCL (PF) 1 % IJ SOLN
INTRAMUSCULAR | Status: AC
  Filled 2015-04-10: qty 2

## 2015-04-10 MED ORDER — LIDOCAINE HCL 3.5 % OP GEL
OPHTHALMIC | Status: AC
  Filled 2015-04-10: qty 1

## 2015-04-10 MED ORDER — TETRACAINE HCL 0.5 % OP SOLN
OPHTHALMIC | Status: AC
  Filled 2015-04-10: qty 4

## 2015-04-10 MED ORDER — PHENYLEPHRINE HCL 2.5 % OP SOLN
OPHTHALMIC | Status: AC
  Filled 2015-04-10: qty 15

## 2015-04-10 MED ORDER — CYCLOPENTOLATE-PHENYLEPHRINE OP SOLN OPTIME - NO CHARGE
OPHTHALMIC | Status: AC
  Filled 2015-04-10: qty 2

## 2015-04-10 MED ORDER — NEOMYCIN-POLYMYXIN-DEXAMETH 3.5-10000-0.1 OP SUSP
OPHTHALMIC | Status: AC
  Filled 2015-04-10: qty 5

## 2015-04-11 ENCOUNTER — Encounter (HOSPITAL_COMMUNITY)
Admission: RE | Disposition: A | Discharge status: Home / Self Care | Payer: Self-pay | Source: Ambulatory Visit | Attending: Ophthalmology

## 2015-04-11 ENCOUNTER — Encounter (HOSPITAL_COMMUNITY): Payer: Self-pay

## 2015-04-11 ENCOUNTER — Ambulatory Visit (HOSPITAL_COMMUNITY): Payer: Medicaid Other | Admitting: Anesthesiology

## 2015-04-11 ENCOUNTER — Ambulatory Visit (HOSPITAL_COMMUNITY)
Admission: RE | Admit: 2015-04-11 | Discharge: 2015-04-11 | Disposition: A | Discharge status: Home / Self Care | Payer: Medicaid Other | Source: Ambulatory Visit | Attending: Ophthalmology | Admitting: Ophthalmology

## 2015-04-11 DIAGNOSIS — J449 Chronic obstructive pulmonary disease, unspecified: Secondary | ICD-10-CM | POA: Diagnosis not present

## 2015-04-11 DIAGNOSIS — E119 Type 2 diabetes mellitus without complications: Secondary | ICD-10-CM | POA: Diagnosis not present

## 2015-04-11 DIAGNOSIS — K219 Gastro-esophageal reflux disease without esophagitis: Secondary | ICD-10-CM | POA: Diagnosis not present

## 2015-04-11 DIAGNOSIS — E7800 Pure hypercholesterolemia, unspecified: Secondary | ICD-10-CM | POA: Insufficient documentation

## 2015-04-11 DIAGNOSIS — Z7984 Long term (current) use of oral hypoglycemic drugs: Secondary | ICD-10-CM | POA: Diagnosis not present

## 2015-04-11 DIAGNOSIS — Z79899 Other long term (current) drug therapy: Secondary | ICD-10-CM | POA: Diagnosis not present

## 2015-04-11 DIAGNOSIS — I10 Essential (primary) hypertension: Secondary | ICD-10-CM | POA: Insufficient documentation

## 2015-04-11 DIAGNOSIS — H269 Unspecified cataract: Secondary | ICD-10-CM | POA: Insufficient documentation

## 2015-04-11 HISTORY — PX: CATARACT EXTRACTION W/PHACO: SHX586

## 2015-04-11 LAB — GLUCOSE, CAPILLARY: Glucose-Capillary: 309 mg/dL — ABNORMAL HIGH (ref 65–99)

## 2015-04-11 SURGERY — PHACOEMULSIFICATION, CATARACT, WITH IOL INSERTION
Anesthesia: Monitor Anesthesia Care | Laterality: Left

## 2015-04-11 MED ORDER — FENTANYL CITRATE (PF) 100 MCG/2ML IJ SOLN: 25.0000 ug | INTRAMUSCULAR | Status: DC | PRN

## 2015-04-11 MED ORDER — NEOMYCIN-POLYMYXIN-DEXAMETH 3.5-10000-0.1 OP SUSP
OPHTHALMIC | Status: DC | PRN
  Administered 2015-04-11: 2 [drp] via OPHTHALMIC

## 2015-04-11 MED ORDER — MIDAZOLAM HCL 2 MG/2ML IJ SOLN
1.0000 mg | INTRAMUSCULAR | Status: DC | PRN
  Administered 2015-04-11: 2 mg via INTRAVENOUS

## 2015-04-11 MED ORDER — EPINEPHRINE HCL 1 MG/ML IJ SOLN
INTRAMUSCULAR | Status: AC
  Filled 2015-04-11: qty 2

## 2015-04-11 MED ORDER — FENTANYL CITRATE (PF) 100 MCG/2ML IJ SOLN
INTRAMUSCULAR | Status: AC
  Filled 2015-04-11: qty 2

## 2015-04-11 MED ORDER — SODIUM HYALURONATE 23 MG/ML IO SOLN
INTRAOCULAR | Status: DC | PRN
Start: 2015-04-11 — End: 2015-04-11
  Administered 2015-04-11: 0.6 mL via INTRAOCULAR

## 2015-04-11 MED ORDER — FENTANYL CITRATE (PF) 100 MCG/2ML IJ SOLN
25.0000 ug | INTRAMUSCULAR | Status: AC
  Administered 2015-04-11: 25 ug via INTRAVENOUS

## 2015-04-11 MED ORDER — MIDAZOLAM HCL 2 MG/2ML IJ SOLN
INTRAMUSCULAR | Status: AC
  Filled 2015-04-11: qty 2

## 2015-04-11 MED ORDER — PHENYLEPHRINE HCL 2.5 % OP SOLN
1.0000 [drp] | OPHTHALMIC | Status: AC
  Administered 2015-04-11 (×3): 1 [drp] via OPHTHALMIC

## 2015-04-11 MED ORDER — BSS IO SOLN
INTRAOCULAR | Status: DC | PRN
  Administered 2015-04-11: 15 mL

## 2015-04-11 MED ORDER — LIDOCAINE HCL 3.5 % OP GEL
1.0000 | Freq: Once | OPHTHALMIC | Status: AC
Start: 2015-04-11 — End: 2015-04-11
  Administered 2015-04-11: 1 via OPHTHALMIC

## 2015-04-11 MED ORDER — ONDANSETRON HCL 4 MG/2ML IJ SOLN: 4.0000 mg | Freq: Once | INTRAMUSCULAR | Status: DC | PRN

## 2015-04-11 MED ORDER — PROVISC 10 MG/ML IO SOLN
INTRAOCULAR | Status: DC | PRN
  Administered 2015-04-11: 0.85 mL via INTRAOCULAR

## 2015-04-11 MED ORDER — TETRACAINE HCL 0.5 % OP SOLN
1.0000 [drp] | OPHTHALMIC | Status: AC | PRN
  Administered 2015-04-11 (×3): 1 [drp] via OPHTHALMIC

## 2015-04-11 MED ORDER — POVIDONE-IODINE 5 % OP SOLN
OPHTHALMIC | Status: DC | PRN
  Administered 2015-04-11: 1 via OPHTHALMIC

## 2015-04-11 MED ORDER — LACTATED RINGERS IV SOLN
INTRAVENOUS | Status: DC
  Administered 2015-04-11: 07:00:00 via INTRAVENOUS

## 2015-04-11 MED ORDER — CYCLOPENTOLATE-PHENYLEPHRINE 0.2-1 % OP SOLN
1.0000 [drp] | OPHTHALMIC | Status: AC
  Administered 2015-04-11 (×3): 1 [drp] via OPHTHALMIC

## 2015-04-11 MED ORDER — EPINEPHRINE HCL 1 MG/ML IJ SOLN
INTRAOCULAR | Status: DC | PRN
  Administered 2015-04-11: 500 mL

## 2015-04-11 MED ORDER — EPINEPHRINE HCL 1 MG/ML IJ SOLN
INTRAMUSCULAR | Status: DC | PRN
  Administered 2015-04-11: 1 mL via OPHTHALMIC

## 2015-04-11 SURGICAL SUPPLY — 13 items
CLOTH BEACON ORANGE TIMEOUT ST (SAFETY) ×1 IMPLANT
EYE SHIELD UNIVERSAL CLEAR (GAUZE/BANDAGES/DRESSINGS) ×1 IMPLANT
GLOVE BIOGEL PI IND STRL 6.5 (GLOVE) IMPLANT
GLOVE BIOGEL PI IND STRL 7.0 (GLOVE) ×1 IMPLANT
GLOVE BIOGEL PI INDICATOR 6.5 (GLOVE) ×1
GLOVE BIOGEL PI INDICATOR 7.0 (GLOVE) ×1
PAD ARMBOARD 7.5X6 YLW CONV (MISCELLANEOUS) ×1 IMPLANT
SIGHTPATH CAT PROC W REG LENS (Ophthalmic Related) ×1 IMPLANT
SYR TB 1ML LL NO SAFETY (SYRINGE) ×2 IMPLANT
TAPE SURG TRANSPORE 1 IN (GAUZE/BANDAGES/DRESSINGS) IMPLANT
TAPE SURGICAL TRANSPORE 1 IN (GAUZE/BANDAGES/DRESSINGS) ×1
VISCOELASTIC ADDITIONAL (OPHTHALMIC RELATED) ×2 IMPLANT
WATER STERILE IRR 250ML POUR (IV SOLUTION) ×1 IMPLANT

## 2015-04-11 NOTE — Anesthesia Postprocedure Evaluation (Signed)
Anesthesia Post Note  Patient: Erica [name]  Procedure(s) Performed: Procedure(s) (LRB): CATARACT EXTRACTION PHACO AND INTRAOCULAR LENS PLACEMENT LEFT EYE CDE=9.68 (Left)  Patient location during evaluation: Short Stay Anesthesia Type: MAC Level of consciousness: awake and alert Pain management: pain level controlled Vital Signs Assessment: post-procedure vital signs reviewed and stable Respiratory status: spontaneous breathing Cardiovascular status: blood pressure returned to baseline Postop Assessment: no signs of nausea or vomiting Anesthetic complications: no    Last Vitals:  Filed Vitals:   04/11/15 0710 04/11/15 0715  BP: 137/77 149/92  Pulse:    Temp:    Resp: 11 22    Last Pain: There were no vitals filed for this visit.               Klye Besecker

## 2015-04-11 NOTE — Discharge Instructions (Signed)

## 2015-04-11 NOTE — Transfer of Care (Signed)
Immediate Anesthesia Transfer of Care Note  Patient: Erica [name]  Procedure(s) Performed: Procedure(s): CATARACT EXTRACTION PHACO AND INTRAOCULAR LENS PLACEMENT LEFT EYE CDE=9.68 (Left)  Patient Location: Short Stay  Anesthesia Type:MAC  Level of Consciousness: awake  Airway & Oxygen Therapy: Patient Spontanous Breathing  Post-op Assessment: Report given to RN  Post vital signs: Reviewed  Last Vitals:  Filed Vitals:   04/11/15 0710 04/11/15 0715  BP: 137/77 149/92  Pulse:    Temp:    Resp: 11 22    Complications: No apparent anesthesia complications

## 2015-04-11 NOTE — H&P (Signed)
The H and P was reviewed and updated.  No changes were found after exam.  The surgical eye was marked.  

## 2015-04-11 NOTE — Op Note (Signed)
Date of procedure:   Pre-operative diagnosis: Visually significant cataract, Left Eye  Post-operative diagnosis: Visually significant cataract, Left Eye  Procedure: Removal of cataract via phacoemulsification and insertion of intra-ocular lens Hoya 250 +22.0D into the capsular bag of the Left Eye  Attending surgeon: Gerda Diss. Madline Oesterling, MD, MA  Anesthesia: MAC, Topical Akten  Complications: None  Estimated Blood Loss: <28mL (minimal)  Specimens: None  Implants: As above  Indications:  Visually significant cataract, Left Eye  Procedure:  The patient was seen and identified in the pre-operative area. The operative eye was identified and dilated.  The operative eye was marked.  Topical anesthesia was administered to the operative eye.     The patient was then to the operative suite and placed in the supine position.  A timeout was performed confirming the patient, procedure to be performed, and all other relevant information.   The patient's face was prepped and draped in the usual fashion for intra-ocular surgery.  A lid speculum was placed into the operative eye and the surgical microscope moved into place and focused.  A superotemporal paracentesis was created using a 15 degree blad.  Shugarcaine was injected into the anterior chamber.  Viscoelastic was injected into the anterior chamber.  A temporal clear-corneal main wound incision was created using a 2.34mm microkeratome.  A continuous curvilinear capsulorrhexis was initiated using an irrigating cystitome and completed using capsulorrhexis forceps.  Hydrodissection and hydrodeliniation were performed.  Viscoelastic was injected into the anterior chamber.  A phacoemulsification handpiece and a chopper as a second instrument were used to remove the nucleus and epinucleus. The irrigation/aspiration handpiece was used to remove any remaining cortical material.   The capsular bag was reinflated with viscoelastic, checked, and found to be intact.  The new intraocular lens was inserted into the capsular bag and dialed into place using a Sinskey hook.  The irrigation/aspiration handpiece was used to remove any remaining viscoelastic. Miochol was injected into the eye.  The clear corneal wound and paracentesis wounds were then hydrated and checked with Weck-Cels to be watertight.  The lid-speculum and drape was removed, and the patient's face was cleaned with a wet and dry 4x4.  Vigamox was instilled in the eye before a clear shield was taped over the eye. The patient was taken to the post-operative care unit in good condition, having tolerated the procedure well.  Post-Op Instructions: The patient will follow up at [facility] for a same day post-operative evaluation and will receive all other orders and instructions.

## 2015-04-11 NOTE — Anesthesia Preprocedure Evaluation (Signed)
Anesthesia Evaluation  Patient identified by MRN, date of birth, ID band Patient awake    Reviewed: Allergy & Precautions, NPO status , Patient's Chart, lab work & pertinent test results, reviewed documented beta blocker date and time   Airway Mallampati: III  TM Distance: >3 FB Neck ROM: Full    Dental  (+) Edentulous Upper, Edentulous Lower, Lower Dentures, Upper Dentures   Pulmonary pneumonia, resolved, COPD,    Pulmonary exam normal        Cardiovascular hypertension, Pt. on medications and Pt. on home beta blockers + Past MI  Normal cardiovascular exam     Neuro/Psych Seizures -,  Depression    GI/Hepatic GERD  Medicated and Controlled,  Endo/Other  diabetes, Poorly Controlled, Type 2, Oral Hypoglycemic Agents  Renal/GU      Musculoskeletal   Abdominal Normal abdominal exam  (+)   Peds  Hematology   Anesthesia Other Findings   Reproductive/Obstetrics                             Anesthesia Physical Anesthesia Plan  ASA: III  Anesthesia Plan: MAC   Post-op Pain Management:    Induction: Intravenous  Airway Management Planned: Nasal Cannula  Additional Equipment:   Intra-op Plan:   Post-operative Plan:   Informed Consent: I have reviewed the patients History and Physical, chart, labs and discussed the procedure including the risks, benefits and alternatives for the proposed anesthesia with the patient or authorized representative who has indicated his/her understanding and acceptance.   Dental advisory given  Plan Discussed with: CRNA  Anesthesia Plan Comments:         Anesthesia Quick Evaluation

## 2015-04-12 ENCOUNTER — Encounter (HOSPITAL_COMMUNITY): Payer: Self-pay | Admitting: Ophthalmology

## 2017-01-04 ENCOUNTER — Emergency Department (HOSPITAL_COMMUNITY)
Admission: EM | Admit: 2017-01-04 | Discharge: 2017-01-04 | Discharge status: Against Medical Advice | Payer: Medicaid Other

## 2017-01-04 NOTE — ED Notes (Signed)
No answer in lobby when called. For triage.

## 2017-01-04 NOTE — ED Notes (Signed)
No answer for triage.

## 2017-09-16 HISTORY — PX: URETERAL STENT PLACEMENT: SHX822

## 2017-10-07 DIAGNOSIS — E440 Moderate protein-calorie malnutrition: Secondary | ICD-10-CM | POA: Insufficient documentation

## 2017-10-07 DIAGNOSIS — G9340 Encephalopathy, unspecified: Secondary | ICD-10-CM | POA: Insufficient documentation

## 2017-10-07 HISTORY — PX: OTHER SURGICAL HISTORY: SHX169

## 2017-11-08 ENCOUNTER — Encounter (HOSPITAL_COMMUNITY): Payer: Self-pay

## 2017-11-08 ENCOUNTER — Emergency Department (HOSPITAL_COMMUNITY): Payer: Medicaid Other

## 2017-11-08 ENCOUNTER — Inpatient Hospital Stay (HOSPITAL_COMMUNITY)
Admission: EM | Admit: 2017-11-08 | Discharge: 2017-11-29 | DRG: 673 | Disposition: A | Discharge status: Rehabilitation Facility | Payer: Medicaid Other | Attending: Internal Medicine | Admitting: Internal Medicine

## 2017-11-08 ENCOUNTER — Other Ambulatory Visit: Payer: Self-pay

## 2017-11-08 DIAGNOSIS — K219 Gastro-esophageal reflux disease without esophagitis: Secondary | ICD-10-CM | POA: Diagnosis present

## 2017-11-08 DIAGNOSIS — I252 Old myocardial infarction: Secondary | ICD-10-CM | POA: Diagnosis not present

## 2017-11-08 DIAGNOSIS — D649 Anemia, unspecified: Secondary | ICD-10-CM

## 2017-11-08 DIAGNOSIS — N178 Other acute kidney failure: Secondary | ICD-10-CM | POA: Diagnosis not present

## 2017-11-08 DIAGNOSIS — K3184 Gastroparesis: Secondary | ICD-10-CM | POA: Diagnosis present

## 2017-11-08 DIAGNOSIS — E785 Hyperlipidemia, unspecified: Secondary | ICD-10-CM | POA: Diagnosis present

## 2017-11-08 DIAGNOSIS — E1165 Type 2 diabetes mellitus with hyperglycemia: Secondary | ICD-10-CM | POA: Diagnosis present

## 2017-11-08 DIAGNOSIS — N189 Chronic kidney disease, unspecified: Secondary | ICD-10-CM | POA: Diagnosis not present

## 2017-11-08 DIAGNOSIS — E1122 Type 2 diabetes mellitus with diabetic chronic kidney disease: Secondary | ICD-10-CM | POA: Diagnosis present

## 2017-11-08 DIAGNOSIS — D638 Anemia in other chronic diseases classified elsewhere: Secondary | ICD-10-CM | POA: Diagnosis not present

## 2017-11-08 DIAGNOSIS — Z23 Encounter for immunization: Secondary | ICD-10-CM | POA: Diagnosis not present

## 2017-11-08 DIAGNOSIS — N185 Chronic kidney disease, stage 5: Secondary | ICD-10-CM | POA: Diagnosis present

## 2017-11-08 DIAGNOSIS — Z79899 Other long term (current) drug therapy: Secondary | ICD-10-CM

## 2017-11-08 DIAGNOSIS — Z681 Body mass index (BMI) 19 or less, adult: Secondary | ICD-10-CM | POA: Diagnosis not present

## 2017-11-08 DIAGNOSIS — I132 Hypertensive heart and chronic kidney disease with heart failure and with stage 5 chronic kidney disease, or end stage renal disease: Secondary | ICD-10-CM | POA: Diagnosis present

## 2017-11-08 DIAGNOSIS — E872 Acidosis, unspecified: Secondary | ICD-10-CM | POA: Diagnosis present

## 2017-11-08 DIAGNOSIS — F17220 Nicotine dependence, chewing tobacco, uncomplicated: Secondary | ICD-10-CM | POA: Diagnosis present

## 2017-11-08 DIAGNOSIS — E876 Hypokalemia: Secondary | ICD-10-CM | POA: Diagnosis not present

## 2017-11-08 DIAGNOSIS — I5032 Chronic diastolic (congestive) heart failure: Secondary | ICD-10-CM | POA: Diagnosis present

## 2017-11-08 DIAGNOSIS — Z9842 Cataract extraction status, left eye: Secondary | ICD-10-CM

## 2017-11-08 DIAGNOSIS — Z886 Allergy status to analgesic agent status: Secondary | ICD-10-CM

## 2017-11-08 DIAGNOSIS — I639 Cerebral infarction, unspecified: Secondary | ICD-10-CM | POA: Diagnosis not present

## 2017-11-08 DIAGNOSIS — N390 Urinary tract infection, site not specified: Secondary | ICD-10-CM | POA: Diagnosis not present

## 2017-11-08 DIAGNOSIS — G8194 Hemiplegia, unspecified affecting left nondominant side: Secondary | ICD-10-CM | POA: Diagnosis not present

## 2017-11-08 DIAGNOSIS — N139 Obstructive and reflux uropathy, unspecified: Secondary | ICD-10-CM | POA: Diagnosis present

## 2017-11-08 DIAGNOSIS — E43 Unspecified severe protein-calorie malnutrition: Secondary | ICD-10-CM | POA: Diagnosis present

## 2017-11-08 DIAGNOSIS — B9620 Unspecified Escherichia coli [E. coli] as the cause of diseases classified elsewhere: Secondary | ICD-10-CM

## 2017-11-08 DIAGNOSIS — R569 Unspecified convulsions: Secondary | ICD-10-CM

## 2017-11-08 DIAGNOSIS — Z992 Dependence on renal dialysis: Secondary | ICD-10-CM

## 2017-11-08 DIAGNOSIS — G40101 Localization-related (focal) (partial) symptomatic epilepsy and epileptic syndromes with simple partial seizures, not intractable, with status epilepticus: Secondary | ICD-10-CM | POA: Diagnosis not present

## 2017-11-08 DIAGNOSIS — N186 End stage renal disease: Secondary | ICD-10-CM

## 2017-11-08 DIAGNOSIS — N1330 Unspecified hydronephrosis: Secondary | ICD-10-CM | POA: Diagnosis not present

## 2017-11-08 DIAGNOSIS — I120 Hypertensive chronic kidney disease with stage 5 chronic kidney disease or end stage renal disease: Secondary | ICD-10-CM | POA: Diagnosis not present

## 2017-11-08 DIAGNOSIS — N19 Unspecified kidney failure: Secondary | ICD-10-CM | POA: Diagnosis present

## 2017-11-08 DIAGNOSIS — I69398 Other sequelae of cerebral infarction: Secondary | ICD-10-CM | POA: Diagnosis not present

## 2017-11-08 DIAGNOSIS — E559 Vitamin D deficiency, unspecified: Secondary | ICD-10-CM | POA: Diagnosis present

## 2017-11-08 DIAGNOSIS — Z452 Encounter for adjustment and management of vascular access device: Secondary | ICD-10-CM

## 2017-11-08 DIAGNOSIS — G40909 Epilepsy, unspecified, not intractable, without status epilepticus: Secondary | ICD-10-CM

## 2017-11-08 DIAGNOSIS — N170 Acute kidney failure with tubular necrosis: Principal | ICD-10-CM | POA: Diagnosis present

## 2017-11-08 DIAGNOSIS — R5381 Other malaise: Secondary | ICD-10-CM | POA: Diagnosis not present

## 2017-11-08 DIAGNOSIS — F329 Major depressive disorder, single episode, unspecified: Secondary | ICD-10-CM | POA: Diagnosis present

## 2017-11-08 DIAGNOSIS — A499 Bacterial infection, unspecified: Secondary | ICD-10-CM | POA: Diagnosis not present

## 2017-11-08 DIAGNOSIS — D72829 Elevated white blood cell count, unspecified: Secondary | ICD-10-CM

## 2017-11-08 DIAGNOSIS — I10 Essential (primary) hypertension: Secondary | ICD-10-CM

## 2017-11-08 DIAGNOSIS — E1151 Type 2 diabetes mellitus with diabetic peripheral angiopathy without gangrene: Secondary | ICD-10-CM | POA: Diagnosis present

## 2017-11-08 DIAGNOSIS — I6300 Cerebral infarction due to thrombosis of unspecified precerebral artery: Secondary | ICD-10-CM | POA: Diagnosis not present

## 2017-11-08 DIAGNOSIS — N179 Acute kidney failure, unspecified: Secondary | ICD-10-CM

## 2017-11-08 DIAGNOSIS — Z9841 Cataract extraction status, right eye: Secondary | ICD-10-CM

## 2017-11-08 DIAGNOSIS — E1143 Type 2 diabetes mellitus with diabetic autonomic (poly)neuropathy: Secondary | ICD-10-CM | POA: Diagnosis present

## 2017-11-08 DIAGNOSIS — Z961 Presence of intraocular lens: Secondary | ICD-10-CM | POA: Diagnosis present

## 2017-11-08 DIAGNOSIS — D631 Anemia in chronic kidney disease: Secondary | ICD-10-CM | POA: Diagnosis present

## 2017-11-08 DIAGNOSIS — E440 Moderate protein-calorie malnutrition: Secondary | ICD-10-CM | POA: Diagnosis not present

## 2017-11-08 DIAGNOSIS — D500 Iron deficiency anemia secondary to blood loss (chronic): Secondary | ICD-10-CM | POA: Diagnosis present

## 2017-11-08 DIAGNOSIS — I69319 Unspecified symptoms and signs involving cognitive functions following cerebral infarction: Secondary | ICD-10-CM | POA: Diagnosis not present

## 2017-11-08 DIAGNOSIS — R188 Other ascites: Secondary | ICD-10-CM | POA: Diagnosis present

## 2017-11-08 DIAGNOSIS — R29810 Facial weakness: Secondary | ICD-10-CM | POA: Diagnosis not present

## 2017-11-08 DIAGNOSIS — E119 Type 2 diabetes mellitus without complications: Secondary | ICD-10-CM | POA: Diagnosis not present

## 2017-11-08 DIAGNOSIS — E11649 Type 2 diabetes mellitus with hypoglycemia without coma: Secondary | ICD-10-CM | POA: Diagnosis not present

## 2017-11-08 DIAGNOSIS — I6523 Occlusion and stenosis of bilateral carotid arteries: Secondary | ICD-10-CM | POA: Diagnosis not present

## 2017-11-08 DIAGNOSIS — E538 Deficiency of other specified B group vitamins: Secondary | ICD-10-CM | POA: Diagnosis present

## 2017-11-08 DIAGNOSIS — E1140 Type 2 diabetes mellitus with diabetic neuropathy, unspecified: Secondary | ICD-10-CM | POA: Diagnosis present

## 2017-11-08 DIAGNOSIS — N289 Disorder of kidney and ureter, unspecified: Secondary | ICD-10-CM | POA: Diagnosis present

## 2017-11-08 DIAGNOSIS — K529 Noninfective gastroenteritis and colitis, unspecified: Secondary | ICD-10-CM | POA: Diagnosis present

## 2017-11-08 DIAGNOSIS — Z833 Family history of diabetes mellitus: Secondary | ICD-10-CM

## 2017-11-08 DIAGNOSIS — Z960 Presence of urogenital implants: Secondary | ICD-10-CM

## 2017-11-08 DIAGNOSIS — I5030 Unspecified diastolic (congestive) heart failure: Secondary | ICD-10-CM | POA: Diagnosis not present

## 2017-11-08 DIAGNOSIS — Z794 Long term (current) use of insulin: Secondary | ICD-10-CM

## 2017-11-08 DIAGNOSIS — N10 Acute tubulo-interstitial nephritis: Secondary | ICD-10-CM

## 2017-11-08 DIAGNOSIS — E875 Hyperkalemia: Secondary | ICD-10-CM | POA: Diagnosis present

## 2017-11-08 DIAGNOSIS — N136 Pyonephrosis: Secondary | ICD-10-CM | POA: Diagnosis present

## 2017-11-08 DIAGNOSIS — Z7951 Long term (current) use of inhaled steroids: Secondary | ICD-10-CM

## 2017-11-08 DIAGNOSIS — R471 Dysarthria and anarthria: Secondary | ICD-10-CM | POA: Diagnosis present

## 2017-11-08 DIAGNOSIS — Z87442 Personal history of urinary calculi: Secondary | ICD-10-CM

## 2017-11-08 HISTORY — DX: Type 2 diabetes mellitus with ketoacidosis without coma: E11.10

## 2017-11-08 HISTORY — DX: Tubulo-interstitial nephritis, not specified as acute or chronic: N12

## 2017-11-08 HISTORY — DX: Unspecified diastolic (congestive) heart failure: I50.30

## 2017-11-08 LAB — URINALYSIS, ROUTINE W REFLEX MICROSCOPIC
BACTERIA UA: NONE SEEN
Bilirubin Urine: NEGATIVE
GLUCOSE, UA: NEGATIVE mg/dL
Ketones, ur: NEGATIVE mg/dL
Nitrite: NEGATIVE
PH: 6 (ref 5.0–8.0)
PROTEIN: 100 mg/dL — AB
RBC / HPF: >50 RBC/hpf — ABNORMAL HIGH (ref 0–5)
Specific Gravity, Urine: 1.009 (ref 1.005–1.030)
WBC, UA: >50 WBC/hpf — ABNORMAL HIGH (ref 0–5)

## 2017-11-08 LAB — COMPREHENSIVE METABOLIC PANEL
ALK PHOS: 90 U/L (ref 38–126)
ALT: 8 U/L (ref 0–44)
ANION GAP: 12 (ref 5–15)
AST: 12 U/L — ABNORMAL LOW (ref 15–41)
Albumin: 2.4 g/dL — ABNORMAL LOW (ref 3.5–5.0)
BUN: 81 mg/dL — ABNORMAL HIGH (ref 8–23)
CHLORIDE: 113 mmol/L — AB (ref 98–111)
CO2: 12 mmol/L — ABNORMAL LOW (ref 22–32)
Calcium: 8 mg/dL — ABNORMAL LOW (ref 8.9–10.3)
Creatinine, Ser: 11.93 mg/dL — ABNORMAL HIGH (ref 0.44–1.00)
GFR calc non Af Amer: 3 mL/min — ABNORMAL LOW (ref >60)
GFR, EST AFRICAN AMERICAN: 3 mL/min — AB (ref >60)
GLUCOSE: 214 mg/dL — AB (ref 70–99)
Potassium: 5.3 mmol/L — ABNORMAL HIGH (ref 3.5–5.1)
SODIUM: 137 mmol/L (ref 135–145)
TOTAL PROTEIN: 7.8 g/dL (ref 6.5–8.1)
Total Bilirubin: 0.5 mg/dL (ref 0.3–1.2)

## 2017-11-08 LAB — CBC WITH DIFFERENTIAL/PLATELET
BASOS ABS: 0 10*3/uL (ref 0.0–0.1)
Basophils Relative: 0 %
EOS ABS: 0 10*3/uL (ref 0.0–0.7)
EOS PCT: 0 %
HCT: 32.1 % — ABNORMAL LOW (ref 36.0–46.0)
HEMOGLOBIN: 10.1 g/dL — AB (ref 12.0–15.0)
Lymphocytes Relative: 12 %
Lymphs Abs: 1.9 10*3/uL (ref 0.7–4.0)
MCH: 28 pg (ref 26.0–34.0)
MCHC: 31.5 g/dL (ref 30.0–36.0)
MCV: 88.9 fL (ref 78.0–100.0)
Monocytes Absolute: 1.4 10*3/uL — ABNORMAL HIGH (ref 0.1–1.0)
Monocytes Relative: 9 %
NEUTROS PCT: 79 %
Neutro Abs: 12.5 10*3/uL — ABNORMAL HIGH (ref 1.7–7.7)
PLATELETS: 533 10*3/uL — AB (ref 150–400)
RBC: 3.61 MIL/uL — AB (ref 3.87–5.11)
RDW: 16.6 % — ABNORMAL HIGH (ref 11.5–15.5)
WBC: 15.8 10*3/uL — AB (ref 4.0–10.5)

## 2017-11-08 MED ORDER — SENNOSIDES-DOCUSATE SODIUM 8.6-50 MG PO TABS: 1.0000 | ORAL_TABLET | Freq: Every evening | ORAL | Status: DC | PRN

## 2017-11-08 MED ORDER — STERILE WATER FOR INJECTION IV SOLN
INTRAVENOUS | Status: DC
  Administered 2017-11-09 – 2017-11-12 (×9): via INTRAVENOUS
  Filled 2017-11-08 (×20): qty 850

## 2017-11-08 MED ORDER — ONDANSETRON HCL 4 MG PO TABS: 4.0000 mg | ORAL_TABLET | Freq: Four times a day (QID) | ORAL | Status: DC | PRN

## 2017-11-08 MED ORDER — INSULIN ASPART 100 UNIT/ML ~~LOC~~ SOLN
0.0000 [IU] | Freq: Three times a day (TID) | SUBCUTANEOUS | Status: DC
  Administered 2017-11-09: 3 [IU] via SUBCUTANEOUS
  Administered 2017-11-09: 2 [IU] via SUBCUTANEOUS
  Administered 2017-11-09: 3 [IU] via SUBCUTANEOUS
  Administered 2017-11-10: 9 [IU] via SUBCUTANEOUS
  Administered 2017-11-10: 5 [IU] via SUBCUTANEOUS
  Administered 2017-11-10: 2 [IU] via SUBCUTANEOUS
  Administered 2017-11-11: 1 [IU] via SUBCUTANEOUS
  Administered 2017-11-11: 2 [IU] via SUBCUTANEOUS
  Administered 2017-11-12: 1 [IU] via SUBCUTANEOUS
  Administered 2017-11-12: 2 [IU] via SUBCUTANEOUS
  Administered 2017-11-13: 1 [IU] via SUBCUTANEOUS
  Administered 2017-11-16: 3 [IU] via SUBCUTANEOUS
  Administered 2017-11-19 – 2017-11-20 (×2): 1 [IU] via SUBCUTANEOUS
  Administered 2017-11-20: 3 [IU] via SUBCUTANEOUS
  Administered 2017-11-20 – 2017-11-21 (×2): 1 [IU] via SUBCUTANEOUS
  Administered 2017-11-21: 2 [IU] via SUBCUTANEOUS
  Administered 2017-11-21: 1 [IU] via SUBCUTANEOUS
  Administered 2017-11-23 (×2): 2 [IU] via SUBCUTANEOUS
  Administered 2017-11-24 – 2017-11-25 (×3): 3 [IU] via SUBCUTANEOUS
  Administered 2017-11-25: 7 [IU] via SUBCUTANEOUS
  Administered 2017-11-25: 1 [IU] via SUBCUTANEOUS
  Administered 2017-11-26: 7 [IU] via SUBCUTANEOUS
  Administered 2017-11-26: 3 [IU] via SUBCUTANEOUS
  Administered 2017-11-26: 5 [IU] via SUBCUTANEOUS
  Administered 2017-11-27 – 2017-11-29 (×5): 2 [IU] via SUBCUTANEOUS

## 2017-11-08 MED ORDER — ACETAMINOPHEN 325 MG PO TABS
650.0000 mg | ORAL_TABLET | Freq: Four times a day (QID) | ORAL | Status: DC | PRN
  Administered 2017-11-09 – 2017-11-27 (×5): 650 mg via ORAL
  Filled 2017-11-08 (×5): qty 2

## 2017-11-08 MED ORDER — ONDANSETRON HCL 4 MG/2ML IJ SOLN
4.0000 mg | Freq: Four times a day (QID) | INTRAMUSCULAR | Status: DC | PRN
  Administered 2017-11-11: 4 mg via INTRAVENOUS
  Filled 2017-11-08: qty 2

## 2017-11-08 MED ORDER — SODIUM CHLORIDE 0.9 % IV SOLN
1.0000 g | INTRAVENOUS | Status: DC
  Administered 2017-11-08 – 2017-11-13 (×6): 1 g via INTRAVENOUS
  Filled 2017-11-08: qty 10
  Filled 2017-11-08: qty 1
  Filled 2017-11-08 (×2): qty 10
  Filled 2017-11-08: qty 1
  Filled 2017-11-08: qty 10
  Filled 2017-11-08 (×3): qty 1

## 2017-11-08 MED ORDER — ACETAMINOPHEN 650 MG RE SUPP
650.0000 mg | Freq: Four times a day (QID) | RECTAL | Status: DC | PRN
  Administered 2017-11-16: 650 mg via RECTAL
  Filled 2017-11-08: qty 1

## 2017-11-08 MED ORDER — SODIUM CHLORIDE 0.9 % IV BOLUS
500.0000 mL | Freq: Once | INTRAVENOUS | Status: AC
  Administered 2017-11-08: 500 mL via INTRAVENOUS

## 2017-11-08 NOTE — ED Triage Notes (Signed)
[facility] reports pt was admitted recently for uti and discharged this month.  Reports for the past few days pt has had generalized weakness and slower to respond.  Pt alert and oriented. Denies any pain.  Rescue says family concerned about dehydration and recurrent UTI.

## 2017-11-08 NOTE — H&P (Signed)
History and Physical    Erica [name] LNL:[id number] DOB: 1955/05/24 DOA: 11/08/2017  PCP: Erica Graves, DO  Patient coming from: Home  I have personally briefly reviewed patient's old medical records in [place]  Chief Complaint: weakness  HPI: Erica [name] is a 62 y.o. female with medical history significant of  DM2, HTn, seizure disorder presents with weakness. I spoke with son Erica [name]  who states she has been laying in bed the past few days. She is always a little confused per her son.  Patient complains of sharp lower abdominal pain and dysuria.  Patient was admitted August of this year to [facility] in [place] for 12 days.  At that time she was admitted for ATN and emphysematous pyelonephritis.  She was treated with Zosyn.  Also had to ureteral  stents placed and she was to have them removed in 2 to 3 weeks after discharge .  sHe was discharged on 10/21/2017.  I called son  who states her renal follow-up wasn't  until December 18 with a nephrologist in [facility] Dr. Wyvonnia [name].  She also has some chronic anemia and required 2 units of blood.  She is to follow-up with GI in [place] which she has not followed as of yet.  Patient denies any chest pain or shortness of breath.  She does have chronic diarrhea.  Shes had  anorexia and had not had much intake the past 2 days.  She did have 2 bowls of broth per  her son today.  ED Course: CT scan of her abdomen pelvis showed her ureteral stents in place.  There was no hydronephrosis.  Her UA showed pyuria and no bacteria.  She was afebrile here and otherwise hemodynamically stable.  Leukocytosis of 15,000.  Creatinine was 11.  Her creatinine was as low as 4 upon discharge from [facility] last month but it was as high as 8.1 on admission there August 19. she was ordered a 500 mL normal saline bolus.  Review of Systems: Positive for lower abdominal sharp pains and dysuria denies fever All others reviewed with patient  and are  negative unless  otherwise stated   Past Medical History:  Diagnosis Date  . Alcohol-induced pancreatitis   . Chronic diarrhea   . Depression   . Diabetes mellitus    fasting blood sugar 110-120s  . Gastroparesis   . GERD (gastroesophageal reflux disease)   . Heart murmur   . History of kidney stones   . Hyperlipidemia   . Hypertension   . Hypokalemia   . Muscle spasm   . Neuropathic pain   . Neuropathy    Hx: of  . Recurrent pancreatitis   . Seizures ([place])   . Vitamin B12 deficiency   . Vitamin D deficiency Grade 1 diastolic dysfunction EF 40% on echo done August 2019 Emphysematous Pyelo DKA     Past Surgical History:  Procedure Laterality Date  . CATARACT EXTRACTION W/PHACO Right 03/14/2015   Procedure: CATARACT EXTRACTION PHACO AND INTRAOCULAR LENS PLACEMENT (IOC);  Surgeon: Baruch Goldmann, MD;  Location: [facility];  Service: Ophthalmology;  Laterality: Right;  CDE:11.13  . CATARACT EXTRACTION W/PHACO Left 04/11/2015   Procedure: CATARACT EXTRACTION PHACO AND INTRAOCULAR LENS PLACEMENT LEFT EYE CDE=9.68;  Surgeon: Baruch Goldmann, MD;  Location: [facility];  Service: Ophthalmology;  Laterality: Left;  . COLONOSCOPY  02/24/2010  . MULTIPLE EXTRACTIONS WITH ALVEOLOPLASTY N/A 06/13/2012   Procedure: MULTIPLE EXTRACION WITH ALVEOLOPLASTY EXTRACT: 18, 19, 20, 21, 22, 24, 25, 27, 28,  29, 30, 31;  Surgeon: Gae Bon, DDS;  Location: [facility];  Service: Oral Surgery;  Laterality: N/A;  . TUBAL LIGATION    . URETERAL STENT PLACEMENT  09/2017     reports that she has never smoked. Her smokeless tobacco use includes snuff. She reports that she drinks about 1.0 standard drinks of alcohol per week. She reports that she does not use drugs.  Allergies  Allergen Reactions  . Aspirin Palpitations    Family History  Problem Relation Age of Onset  . Diabetes Sister   . Chronic Renal Failure Neg Hx    Prior to Admission medications   Medication Sig Start Date End Date Taking? Authorizing Provider    amLODipine (NORVASC) 5 MG tablet Take 5 mg by mouth daily.   Yes [provider]  esomeprazole (NEXIUM) 40 MG capsule Take 40 mg by mouth 2 (two) times daily.    Yes [provider]  hyoscyamine (LEVSIN, ANASPAZ) 0.125 MG tablet Take 0.125 mg by mouth 4 (four) times daily - after meals and at bedtime.   Yes [provider]  insulin aspart (NOVOLOG) 100 UNIT/ML FlexPen Inject 3 Units into the skin 3 (three) times daily with meals. Patient taking differently: Inject 4-10 Units into the skin 3 (three) times daily with meals. Take 15 minutes before meals 09/20/13  Yes Samuella Cota, MD  Insulin Degludec (TRESIBA FLEXTOUCH) 200 UNIT/ML SOPN Inject 10 Units into the skin daily with breakfast.    Yes [provider]  levETIRAcetam (KEPPRA) 1000 MG tablet Take 1 tablet (1,000 mg total) by mouth 2 (two) times daily. 09/20/13  Yes Samuella Cota, MD  linaclotide [facility]) 145 MCG CAPS capsule Take 145 mcg by mouth daily before breakfast.   Yes [provider]  metoCLOPramide (REGLAN) 5 MG tablet Take 5 mg by mouth 4 (four) times daily.   Yes [provider]  metoprolol succinate (TOPROL-XL) 100 MG 24 hr tablet Take 100 mg by mouth daily. Take with or immediately following a meal.   Yes [provider]  mirtazapine (REMERON) 15 MG tablet Take 15 mg by mouth at bedtime.   Yes [provider]  OxyCODONE HCl, Abuse Deter, (OXAYDO) 5 MG TABA Take 5 mg by mouth every 12 (twelve) hours as needed for pain.    Yes [provider]  PARoxetine (PAXIL) 20 MG tablet Take 20 mg by mouth daily.   Yes [provider]  rOPINIRole (REQUIP) 1 MG tablet Take 2 mg by mouth daily. BETWEEN 7 PM AND 8 PM.    Yes [provider]  sodium bicarbonate 650 MG tablet Take 650 mg by mouth 4 (four) times daily.   Yes [provider]  albuterol (PROAIR HFA) 108 (90 BASE) MCG/ACT inhaler Inhale 2 puffs into the lungs every 6 (six)  hours as needed for wheezing or shortness of breath.    [provider]  ALPRAZolam Duanne Moron) 0.5 MG tablet Take 0.5 mg by mouth at bedtime as needed for anxiety.    [provider]  atorvastatin (LIPITOR) 40 MG tablet Take 40 mg by mouth daily.    [provider]  chlordiazePOXIDE (LIBRIUM) 25 MG capsule Take 1 capsule (25 mg total) by mouth 2 (two) times daily as needed for anxiety. Patient taking differently: Take 25 mg by mouth 4 (four) times daily -  before meals and at bedtime.  09/20/13   Samuella Cota, MD  diphenoxylate-atropine (LOMOTIL) 2.5-0.025 MG per tablet Take 1  tablet by mouth 4 (four) times daily as needed for diarrhea or loose stools.    [provider]  doxepin (SINEQUAN) 50 MG capsule Take 50 mg by mouth at bedtime.    [provider]  ergocalciferol (VITAMIN D2) 50000 units capsule Take 50,000 Units by mouth once a week.    [provider]  fenofibrate (TRICOR) 145 MG tablet Take 145 mg by mouth daily.    [provider]  Fluticasone-Salmeterol (ADVAIR) 100-50 MCG/DOSE AEPB Inhale 1 puff into the lungs 2 (two) times daily.    [provider]  folic acid (FOLVITE) 1 MG tablet Take 1 mg by mouth daily.    [provider]  gentamicin (GARAMYCIN) 0.3 % ophthalmic solution Place 1 drop into both eyes 3 (three) times daily.    [provider]  lisinopril (PRINIVIL,ZESTRIL) 10 MG tablet Take 10 mg by mouth daily.    [provider]  magnesium chloride (SLOW-MAG) 64 MG TBEC SR tablet Take 1 tablet by mouth 2 (two) times daily.    [provider]  megestrol (MEGACE) 40 MG tablet Take 40 mg by mouth 2 (two) times daily.    [provider]  metFORMIN (GLUCOPHAGE) 500 MG tablet Take 500 mg by mouth daily with supper.    [provider]  Multiple Vitamin (MULTIVITAMIN WITH MINERALS) TABS tablet Take 1 tablet by mouth daily.    [provider]  ondansetron  (ZOFRAN-ODT) 4 MG disintegrating tablet Take 4 mg by mouth every 6 (six) hours as needed for nausea or vomiting.    [provider]  potassium bicarbonate (K-LYTE) 25 MEQ disintegrating tablet Take 25 mEq by mouth daily.     [provider]  thiamine 100 MG tablet Take 1 tablet (100 mg total) by mouth daily. 07/27/14   Geradine Girt, DO    Physical Exam: Vitals:   11/08/17 1840 11/08/17 2137  BP: 125/69 118/64  Pulse: 84 80  Resp: 16 16  Temp: 98 F (36.7 C)   TempSrc: Oral   SpO2: 99% 100%    Constitutional: NAD, calm, comfortable Vitals:   11/08/17 1840 11/08/17 2137  BP: 125/69 118/64  Pulse: 84 80  Resp: 16 16  Temp: 98 F (36.7 C)   TempSrc: Oral   SpO2: 99% 100%   Eyes: PERRL, lids and conjunctivae normal ENMT: Mucous membranes are moist. Posterior pharynx clear of any exudate or lesions.Normal dentition.  Neck: normal, supple, no masses,  Resp: clear to auscultation bilaterally, no wheezing, no crackles. Normal respiratory effort. No accessory muscle use.  Cardiovascular: Regular rate and rhythm, no murmurs / rubs / gallops. No extremity edema. 2+ pedal pulses. .  Abdomen: no tenderness, no masses palpated. No hepatosplenomegaly. Bowel sounds positive.  Musculoskeletal: no clubbing / cyanosis. No joint deformity upper and lower ext .  Skin: no rashes, lesions, ulcers. No induration Neurologic: CN 2-12 grossly intact. . Strength 5/5 in all 4.  Psychiatric: slow to respond, flat affect . Alert and oriented x 3 but doesn't know her age .     Labs on Admission: I have personally reviewed following labs and imaging studies  CBC: Recent Labs  Lab 11/08/17 1900  WBC 15.8*  NEUTROABS 12.5*  HGB 10.1*  HCT 32.1*  MCV 88.9  PLT 440*   Basic Metabolic Panel: Recent Labs  Lab 11/08/17 1900  NA 137  K 5.3*  CL 113*  CO2 12*  GLUCOSE 214*  BUN 81*  CREATININE 11.93*  CALCIUM  8.0*   GFR: CrCl cannot be calculated (Unknown ideal  weight.). Liver Function Tests: Recent Labs  Lab 11/08/17 1900  AST 12*  ALT 8  ALKPHOS 90  BILITOT 0.5  PROT 7.8  ALBUMIN 2.4*   No results for input(s): LIPASE, AMYLASE in the last 168 hours. No results for input(s): AMMONIA in the last 168 hours. Coagulation Profile: No results for input(s): INR, PROTIME in the last 168 hours. Cardiac Enzymes: No results for input(s): CKTOTAL, CKMB, CKMBINDEX, TROPONINI in the last 168 hours. BNP (last 3 results) No results for input(s): PROBNP in the last 8760 hours. HbA1C: No results for input(s): HGBA1C in the last 72 hours. CBG: No results for input(s): GLUCAP in the last 168 hours. Lipid Profile: No results for input(s): CHOL, HDL, LDLCALC, TRIG, CHOLHDL, LDLDIRECT in the last 72 hours. Thyroid Function Tests: No results for input(s): TSH, T4TOTAL, FREET4, T3FREE, THYROIDAB in the last 72 hours. Anemia Panel: No results for input(s): VITAMINB12, FOLATE, FERRITIN, TIBC, IRON, RETICCTPCT in the last 72 hours. Urine analysis:    Component Value Date/Time   COLORURINE YELLOW 11/08/2017 2007   APPEARANCEUR TURBID (A) 11/08/2017 2007   LABSPEC 1.009 11/08/2017 2007   PHURINE 6.0 11/08/2017 2007   GLUCOSEU NEGATIVE 11/08/2017 2007   HGBUR MODERATE (A) 11/08/2017 2007   BILIRUBINUR NEGATIVE 11/08/2017 2007   KETONESUR NEGATIVE 11/08/2017 2007   PROTEINUR 100 (A) 11/08/2017 2007   UROBILINOGEN 0.2 07/25/2014 2309   NITRITE NEGATIVE 11/08/2017 2007   LEUKOCYTESUR MODERATE (A) 11/08/2017 2007    Radiological Exams on Admission: Ct Renal Stone Study  Result Date: 11/08/2017 CLINICAL DATA:  62 year old female with UTI. History of kidney stones. Displacement of indwelling urethral stent. EXAM: CT ABDOMEN AND PELVIS WITHOUT CONTRAST TECHNIQUE: Multidetector CT imaging of the abdomen and pelvis was performed following the standard protocol without IV contrast. COMPARISON:  CT of the abdomen pelvis dated 12/07/2012 FINDINGS: Evaluation of  this exam is limited in the absence of intravenous contrast. Lower chest: Bibasilar linear atelectasis/scarring. There is calcification of the mitral annulus. There is hypoattenuation of the cardiac blood pool suggestive of a degree of anemia. Clinical correlation is recommended. No intra-abdominal free air. Small ascites and diffuse mesenteric edema. Hepatobiliary: The liver is unremarkable. There is mild intrahepatic biliary ductal dilatation as seen on the prior CT. The gallbladder is not visualized, likely surgically absent. Pancreas: The pancreas is atrophic with coarse calcifications likely sequela of chronic pancreatitis. Spleen: Normal in size without focal abnormality. Adrenals/Urinary Tract: The adrenal glands are unremarkable. Small faint hypodensities within the renal collecting systems on likely represent small nonobstructing calculi versus residual excreted contrast from recent IV administration. Clinical correlation is recommended. Bilateral pigtail ureteral stents noted. The proximal tip of the right ureteral stent is in the right renal upper pole collecting system and the proximal tip of the left ureteral stent is in the left renal pelvis. The distal ends of the stents are within the urinary bladder. There is no hydronephrosis on either side. Stomach/Bowel: There is diffuse thickened appearance of the wall of the stomach as well as thickened appearance of the colon, likely related to ascites. Gastritis or colitis are considered less likely. Clinical correlation is recommended. There is no bowel obstruction. The appendix is normal. Vascular/Lymphatic: The abdominal aorta and IVC are grossly unremarkable on this noncontrast CT. No portal venous gas. Top-normal left retroperitoneal lymph nodes. Reproductive: Vascular calcification of the peripheral uterus noted. No pelvic mass. Other: Diffuse subcutaneous edema and anasarca. Musculoskeletal: There is  degenerative changes of the spine. Grade 1 L4-L5  anterolisthesis. There is compression fracture of the superior endplate of the L2, age indeterminate but new since the prior CT of 2014. Compression fracture of the inferior endplate of U37 and D57. The T12 compression fracture is new since the prior CT. There is no retropulsed fragment. IMPRESSION: 1. Bilateral ureteral stents in place as described. No hydronephrosis. 2. Probable small bilateral renal calculi versus residual contrast from recent study. 3. Small ascites, diffuse edema, and anasarca of indeterminate etiology. Clinical correlation is recommended. 4. Thickened stomach and colon, likely related to ascites. Electronically Signed   By: Anner Crete M.D.   On: 11/08/2017 21:41     Assessment/Plan Principal Problem: Acute on chronic  Active Problems:   Diabetes mellitus (HCC)   Metabolic acidosis   Seizure disorder (HCC) Pyuria Leukocytosis  Chronic normocytic anemia   -Admit inpatient admission to telemetry.  I discussed case with renal Dr. Nona Dell RDING who  recommends IV bicarb and renal ultrasound.  If creatinine is not markedly in improved in the morning she will need to be transferred to [facility] for dialysis.  I explained this patient, and called her son [name] on the phone and explained this to him as well.  Avoid nephrotoxins.  Per Review of her discharge med list from the month she was not discharged on a ACE inhibitor or diuretic.  Awaiting full home med reconciliation -Treat with IV Rocephin follow ,cultures. -Carb consistent diet, sliding scale insulin , check hemoglobin A1c -Continue home meds for seizure disorder and hypertension Chronic anemia stable  DVT prophylaxis: scd Code Status: full  Family Communication: Discussed with her son [name] via phone listed under demographics Disposition Plan: Charge home for 5 days Consults called: Nephrology Dr. Vic Blackbird Admission status: Ip tele  It is my clinical opinion that admission to INPATIENT is reasonable and  necessary because of the expectation that this patient will require hospital care that crosses at least 2 midnights to treat this condition based on the medical complexity of the problems presented.  Given the aforementioned information, the predictability of an adverse outcome is felt to be significant    Shelbie Proctor MD [facility] Pager [phone]  If 7PM-7AM, please contact night-coverage [email or web address] Password [facility]  11/08/2017, 10:12 PM

## 2017-11-08 NOTE — ED Provider Notes (Signed)
[facility] EMERGENCY DEPARTMENT Provider Note   CSN: [id number] Arrival date & time: 11/08/17  1824     History   Chief Complaint Chief Complaint  Patient presents with  . Weakness    HPI Erica [name] is a 62 y.o. female.  HPI Patient is a poor historian.  Presents with family member.  States she is had increased fatigue and generalized weakness over the last 4 days.  Has been lying in bed and not as active as she normally is.  Family notes that the urine is strong and odor.  Concern for recurrence of urinary tract infection.  No definite fevers or chills.  No nausea or vomiting.  Patient has had loose yellow stools.  No blood in the stool.  Recently hospitalized for 2 weeks for complicated pyelonephritis and acute kidney injury. Past Medical History:  Diagnosis Date  . Alcohol-induced pancreatitis   . Chronic diarrhea   . Depression   . Diabetes mellitus    fasting blood sugar 110-120s  . Diastolic CHF ([place])   . DKA (diabetic ketoacidoses) ([place])   . Gastroparesis   . GERD (gastroesophageal reflux disease)   . Heart murmur   . History of kidney stones   . Hyperlipidemia   . Hypertension   . Hypokalemia   . Muscle spasm   . Neuropathic pain   . Neuropathy    Hx: of  . Pyelonephritis   . Recurrent pancreatitis   . Seizures ([place])   . Vitamin B12 deficiency   . Vitamin D deficiency     Patient Active Problem List   Diagnosis Date Noted  . Renal failure 11/08/2017  . Seizure disorder ([place]) 11/08/2017  . Orthostatic hypotension 07/25/2014  . Moderate protein malnutrition ([place]) 09/15/2013  . Aspiration pneumonia ([place]) 09/15/2013  . Acute respiratory failure requiring reintubation ([place]) 09/11/2013  . probable Seizures due to metabolic disorder 02/63/7858  . Lactic acidosis 03/19/2013  . Abdominal pain 03/19/2013  . Rotavirus infection 10/29/2012  . Type II or unspecified type diabetes mellitus without mention of complication, uncontrolled 10/29/2012  .  Protein-calorie malnutrition, severe ([place]) 10/27/2012  . NSTEMI (non-ST elevated myocardial infarction) ([place]) 10/26/2012  . Fever, unspecified 10/26/2012  . Hypotension 10/25/2012  . Alcohol withdrawal ([place]) 10/25/2012  . Metabolic acidosis 85/03/7739  . Chronic diarrhea 10/25/2012  . Tobacco abuse 10/25/2012  . DKA (diabetic ketoacidoses) ([place]) 09/09/2012  . Dehydration 09/09/2012  . DKA, type 2 ([place]) 05/20/2012  . Abnormal LFTs 05/20/2012  . Heart murmur, systolic 28/78/6767  . Diabetes mellitus ([place]) 07/08/2011  . Hypoglycemia 07/08/2011  . Metabolic encephalopathy 20/94/7096  . Alcohol intoxication ([place]) 07/08/2011  . Alcohol abuse 07/08/2011  . Hypokalemia 07/08/2011  . Nausea & vomiting 07/08/2011  . Abdominal pain 07/08/2011  . H/O chronic pancreatitis 07/08/2011    Past Surgical History:  Procedure Laterality Date  . CATARACT EXTRACTION W/PHACO Right 03/14/2015   Procedure: CATARACT EXTRACTION PHACO AND INTRAOCULAR LENS PLACEMENT (IOC);  Surgeon: Baruch Goldmann, MD;  Location: [facility];  Service: Ophthalmology;  Laterality: Right;  CDE:11.13  . CATARACT EXTRACTION W/PHACO Left 04/11/2015   Procedure: CATARACT EXTRACTION PHACO AND INTRAOCULAR LENS PLACEMENT LEFT EYE CDE=9.68;  Surgeon: Baruch Goldmann, MD;  Location: [facility];  Service: Ophthalmology;  Laterality: Left;  . COLONOSCOPY  02/24/2010  . MULTIPLE EXTRACTIONS WITH ALVEOLOPLASTY N/A 06/13/2012   Procedure: MULTIPLE EXTRACION WITH ALVEOLOPLASTY EXTRACT: 18, 19, 20, 21, 22, 24, 25, 27, 28, 29, 30, 31;  Surgeon: Gae Bon, DDS;  Location: [facility]  OR;  Service: Oral Surgery;  Laterality: N/A;  . TUBAL LIGATION    . URETERAL STENT PLACEMENT  09/2017     OB History   None      Home Medications    Prior to Admission medications   Medication Sig Start Date End Date Taking? Authorizing Provider  albuterol (PROAIR HFA) 108 (90 BASE) MCG/ACT inhaler Inhale 2 puffs into the lungs every 6 (six) hours as needed for wheezing or  shortness of breath.   Yes [provider]  amLODipine (NORVASC) 5 MG tablet Take 5 mg by mouth daily.   Yes [provider]  cholecalciferol (VITAMIN D) 1000 units tablet Take 1,000 Units by mouth daily.   Yes [provider]  esomeprazole (NEXIUM) 40 MG capsule Take 40 mg by mouth daily.    Yes [provider]  hyoscyamine (ANASPAZ) 0.125 MG TBDP disintergrating tablet Take 0.125 mg by mouth 4 (four) times daily -  with meals and at bedtime.    Yes [provider]  insulin aspart (NOVOLOG) 100 UNIT/ML FlexPen Inject 3 Units into the skin 3 (three) times daily with meals. Patient taking differently: Inject 4-10 Units into the skin 3 (three) times daily with meals. Take 15 minutes before meals 09/20/13  Yes Samuella Cota, MD  Insulin Degludec (TRESIBA FLEXTOUCH) 200 UNIT/ML SOPN Inject 10 Units into the skin daily with breakfast.    Yes [provider]  levETIRAcetam (KEPPRA) 1000 MG tablet Take 1 tablet (1,000 mg total) by mouth 2 (two) times daily. 09/20/13  Yes Samuella Cota, MD  linaclotide [facility]) 145 MCG CAPS capsule Take 145 mcg by mouth daily before breakfast.   Yes [provider]  magnesium oxide (MAG-OX) 400 MG tablet Take 400 mg by mouth daily.   Yes [provider]  metoCLOPramide (REGLAN) 5 MG tablet Take 5 mg by mouth 4 (four) times daily.   Yes [provider]  metoprolol succinate (TOPROL-XL) 100 MG 24 hr tablet Take 100 mg by mouth daily. Take with or immediately following a meal.   Yes [provider]  mirtazapine (REMERON) 15 MG tablet Take 15 mg by mouth at bedtime.   Yes [provider]  OxyCODONE HCl, Abuse Deter, (OXAYDO) 5 MG TABA Take 5 mg by mouth every 12 (twelve) hours as needed for pain.    Yes [provider]  PARoxetine (PAXIL) 20 MG tablet Take 20 mg by mouth daily.   Yes [provider]  rOPINIRole (REQUIP) 1 MG tablet Take 2 mg by mouth daily.  BETWEEN 7 PM AND 8 PM.    Yes [provider]  sodium bicarbonate 650 MG tablet Take 650 mg by mouth 4 (four) times daily.   Yes [provider]    Family History Family History  Problem Relation Age of Onset  . Diabetes Sister   . Chronic Renal Failure Neg Hx     Social History Social History   Tobacco Use  . Smoking status: Never Smoker  . Smokeless tobacco: Current User    Types: Snuff  Substance Use Topics  . Alcohol use: Yes    Alcohol/week: 1.0 standard drinks    Types: 1 Cans of beer per week    Comment: "sometimes i drink twice a week"  . Drug use: No     Allergies   Aspirin   Review of Systems Review of Systems  Unable to perform ROS: Mental status change     Physical Exam Updated Vital  Signs BP 126/74   Pulse 89   Temp 98.4 F (36.9 C) (Oral)   Resp 17   Ht 5' (1.524 m)   Wt 43.1 kg   SpO2 100%   BMI 18.56 kg/m   Physical Exam  Constitutional: She appears well-developed and well-nourished. No distress.  HENT:  Head: Normocephalic and atraumatic.  Mouth/Throat: Oropharynx is clear and moist. No oropharyngeal exudate.  Eyes: Pupils are equal, round, and reactive to light. EOM are normal.  Neck: Normal range of motion. Neck supple. No JVD present.  Cardiovascular: Normal rate and regular rhythm. Exam reveals no gallop and no friction rub.  No murmur heard. Pulmonary/Chest: Effort normal and breath sounds normal. No stridor. No respiratory distress. She has no wheezes. She has no rales. She exhibits no tenderness.  Abdominal: Soft. Bowel sounds are normal. There is no tenderness. There is no rebound and no guarding.  Musculoskeletal: Normal range of motion. She exhibits no edema or tenderness.  No CVA tenderness bilaterally.  Lymphadenopathy:    She has no cervical adenopathy.  Neurological: She is alert.  Answers simple questions and follow simple commands.  Moving all extremities without focal deficit.  Sensation intact.    Skin: Skin is warm and dry. Capillary refill takes less than 2 seconds. No rash noted. She is not diaphoretic. No erythema.  Psychiatric: She has a normal mood and affect. Her behavior is normal.  Nursing note and vitals reviewed.    ED Treatments / Results  Labs (all labs ordered are listed, but only abnormal results are displayed) Labs Reviewed  URINE CULTURE - Abnormal; Notable for the following components:      Result Value   Culture   (*)    Value: >=100,000 COLONIES/mL ESCHERICHIA COLI SUSCEPTIBILITIES TO FOLLOW Performed at [facility], [address] [place], [place] [address]    All other components within normal limits  CBC WITH DIFFERENTIAL/PLATELET - Abnormal; Notable for the following components:   WBC 15.8 (*)    RBC 3.61 (*)    Hemoglobin 10.1 (*)    HCT 32.1 (*)    RDW 16.6 (*)    Platelets 533 (*)    Neutro Abs 12.5 (*)    Monocytes Absolute 1.4 (*)    All other components within normal limits  COMPREHENSIVE METABOLIC PANEL - Abnormal; Notable for the following components:   Potassium 5.3 (*)    Chloride 113 (*)    CO2 12 (*)    Glucose, Bld 214 (*)    BUN 81 (*)    Creatinine, Ser 11.93 (*)    Calcium 8.0 (*)    Albumin 2.4 (*)    AST 12 (*)    GFR calc non Af Amer 3 (*)    GFR calc Af Amer 3 (*)    All other components within normal limits  URINALYSIS, ROUTINE W REFLEX MICROSCOPIC - Abnormal; Notable for the following components:   APPearance TURBID (*)    Hgb urine dipstick MODERATE (*)    Protein, ur 100 (*)    Leukocytes, UA MODERATE (*)    RBC / HPF >50 (*)    WBC, UA >50 (*)    All other components within normal limits  BASIC METABOLIC PANEL - Abnormal; Notable for the following components:   Potassium 5.4 (*)    Chloride 114 (*)    CO2 9 (*)    Glucose, Bld 217 (*)    BUN 79 (*)    Creatinine, Ser 11.68 (*)  Calcium 7.7 (*)    GFR calc non Af Amer 3 (*)    GFR calc Af Amer 4 (*)    All other components within normal  limits  CBC - Abnormal; Notable for the following components:   WBC 15.6 (*)    RBC 3.63 (*)    Hemoglobin 10.2 (*)    HCT 32.5 (*)    RDW 16.8 (*)    Platelets 524 (*)    All other components within normal limits  PROTEIN / CREATININE RATIO, URINE - Abnormal; Notable for the following components:   Protein Creatinine Ratio 10.40 (*)    All other components within normal limits  GLUCOSE, CAPILLARY - Abnormal; Notable for the following components:   Glucose-Capillary 168 (*)    All other components within normal limits  GLUCOSE, CAPILLARY - Abnormal; Notable for the following components:   Glucose-Capillary 219 (*)    All other components within normal limits  BLOOD GAS, ARTERIAL - Abnormal; Notable for the following components:   pH, Arterial 7.272 (*)    pCO2 arterial 21.0 (*)    pO2, Arterial 82.8 (*)    Bicarbonate 12.1 (*)    Acid-base deficit 16.3 (*)    All other components within normal limits  GLUCOSE, CAPILLARY - Abnormal; Notable for the following components:   Glucose-Capillary 231 (*)    All other components within normal limits  GLUCOSE, CAPILLARY - Abnormal; Notable for the following components:   Glucose-Capillary 180 (*)    All other components within normal limits  RENAL FUNCTION PANEL - Abnormal; Notable for the following components:   CO2 19 (*)    Glucose, Bld 389 (*)    BUN 80 (*)    Creatinine, Ser 10.78 (*)    Calcium 6.6 (*)    Phosphorus 4.9 (*)    Albumin 1.7 (*)    GFR calc non Af Amer 3 (*)    GFR calc Af Amer 4 (*)    All other components within normal limits  GLUCOSE, CAPILLARY - Abnormal; Notable for the following components:   Glucose-Capillary 170 (*)    All other components within normal limits  GLUCOSE, CAPILLARY - Abnormal; Notable for the following components:   Glucose-Capillary 355 (*)    All other components within normal limits  GLUCOSE, CAPILLARY - Abnormal; Notable for the following components:   Glucose-Capillary 275 (*)     All other components within normal limits  HIV ANTIBODY (ROUTINE TESTING W REFLEX)  CREATININE, URINE, RANDOM  SODIUM, URINE, RANDOM  LACTIC ACID, PLASMA  LACTIC ACID, PLASMA    EKG None  Radiology US Renal  Result Date: 11/09/2017 CLINICAL DATA:  62 year old female with chronic kidney disease stage 3. Subsequent encounter. EXAM: RENAL / URINARY TRACT ULTRASOUND COMPLETE COMPARISON:  11/08/2017 CT. FINDINGS: Right Kidney: Length: 10.1 cm. Minimal increased echogenicity. No hydronephrosis or mass. Left Kidney: Length: 10.5 cm. Echogenicity within normal limits. No mass or hydronephrosis visualized. Bladder: Debris within the bladder.  Bilateral ureteral stents in place. IMPRESSION: 1. Bilateral ureteral stents in place.  No hydronephrosis. 2. Debris within the urinary bladder. 3. Slight increased echogenicity right renal parenchyma, may reflect changes of medical renal disease. Electronically Signed   By: Genia Del M.D.   On: 11/09/2017 14:54   Dg Chest Port 1 View  Result Date: 11/09/2017 CLINICAL DATA:  PICC line placement EXAM: PORTABLE CHEST 1 VIEW COMPARISON:  10/06/2017 FINDINGS: Left internal jugular PICC line is been placed with the tip at the cavoatrial  junction. No pneumothorax. Heart is normal size. Lungs clear. No effusions or acute bony abnormality. IMPRESSION: Left PICC line tip at the cavoatrial junction.  No pneumothorax. Electronically Signed   By: Rolm Baptise M.D.   On: 11/09/2017 12:58   Ct Renal Stone Study  Result Date: 11/08/2017 CLINICAL DATA:  62 year old female with UTI. History of kidney stones. Displacement of indwelling urethral stent. EXAM: CT ABDOMEN AND PELVIS WITHOUT CONTRAST TECHNIQUE: Multidetector CT imaging of the abdomen and pelvis was performed following the standard protocol without IV contrast. COMPARISON:  CT of the abdomen pelvis dated 12/07/2012 FINDINGS: Evaluation of this exam is limited in the absence of intravenous contrast. Lower chest:  Bibasilar linear atelectasis/scarring. There is calcification of the mitral annulus. There is hypoattenuation of the cardiac blood pool suggestive of a degree of anemia. Clinical correlation is recommended. No intra-abdominal free air. Small ascites and diffuse mesenteric edema. Hepatobiliary: The liver is unremarkable. There is mild intrahepatic biliary ductal dilatation as seen on the prior CT. The gallbladder is not visualized, likely surgically absent. Pancreas: The pancreas is atrophic with coarse calcifications likely sequela of chronic pancreatitis. Spleen: Normal in size without focal abnormality. Adrenals/Urinary Tract: The adrenal glands are unremarkable. Small faint hypodensities within the renal collecting systems on likely represent small nonobstructing calculi versus residual excreted contrast from recent IV administration. Clinical correlation is recommended. Bilateral pigtail ureteral stents noted. The proximal tip of the right ureteral stent is in the right renal upper pole collecting system and the proximal tip of the left ureteral stent is in the left renal pelvis. The distal ends of the stents are within the urinary bladder. There is no hydronephrosis on either side. Stomach/Bowel: There is diffuse thickened appearance of the wall of the stomach as well as thickened appearance of the colon, likely related to ascites. Gastritis or colitis are considered less likely. Clinical correlation is recommended. There is no bowel obstruction. The appendix is normal. Vascular/Lymphatic: The abdominal aorta and IVC are grossly unremarkable on this noncontrast CT. No portal venous gas. Top-normal left retroperitoneal lymph nodes. Reproductive: Vascular calcification of the peripheral uterus noted. No pelvic mass. Other: Diffuse subcutaneous edema and anasarca. Musculoskeletal: There is degenerative changes of the spine. Grade 1 L4-L5 anterolisthesis. There is compression fracture of the superior endplate of  the L2, age indeterminate but new since the prior CT of 2014. Compression fracture of the inferior endplate of H37 and J69. The T12 compression fracture is new since the prior CT. There is no retropulsed fragment. IMPRESSION: 1. Bilateral ureteral stents in place as described. No hydronephrosis. 2. Probable small bilateral renal calculi versus residual contrast from recent study. 3. Small ascites, diffuse edema, and anasarca of indeterminate etiology. Clinical correlation is recommended. 4. Thickened stomach and colon, likely related to ascites. Electronically Signed   By: Anner Crete M.D.   On: 11/08/2017 21:41    Procedures Procedures (including critical care time)  Medications Ordered in ED Medications  cefTRIAXone (ROCEPHIN) 1 g in sodium chloride 0.9 % 100 mL IVPB (1 g Intravenous New Bag/Given 11/09/17 2121)  sodium bicarbonate 150 mEq in sterile water 1,000 mL infusion ( Intravenous New Bag/Given 11/10/17 0808)  acetaminophen (TYLENOL) tablet 650 mg (650 mg Oral Given 11/09/17 2103)    Or  acetaminophen (TYLENOL) suppository 650 mg ( Rectal See Alternative 11/09/17 2103)  senna-docusate (Senokot-S) tablet 1 tablet (has no administration in time range)  ondansetron (ZOFRAN) tablet 4 mg (has no administration in time range)    Or  ondansetron (ZOFRAN) injection 4 mg (has no administration in time range)  insulin aspart (novoLOG) injection 0-9 Units (5 Units Subcutaneous Given 11/10/17 1158)  furosemide (LASIX) 100 mg in dextrose 5 % 50 mL IVPB (100 mg Intravenous New Bag/Given 11/10/17 1342)  sodium zirconium cyclosilicate (LOKELMA) packet 10 g (10 g Oral Given 11/10/17 0916)  metoprolol succinate (TOPROL-XL) 24 hr tablet 25 mg (25 mg Oral Given 11/10/17 0916)  levETIRAcetam (KEPPRA) tablet 500 mg (500 mg Oral Given 11/10/17 0916)  feeding supplement (GLUCERNA SHAKE) (GLUCERNA SHAKE) liquid 237 mL (237 mLs Oral Given 11/10/17 1340)  insulin glargine (LANTUS) injection 10 Units (has no  administration in time range)  sodium chloride 0.9 % bolus 500 mL (0 mLs Intravenous Stopped 11/08/17 2315)  Influenza vac split quadrivalent PF (FLUARIX) injection 0.5 mL (0.5 mLs Intramuscular Given 11/09/17 1241)     Initial Impression / Assessment and Plan / ED Course  I have reviewed the triage vital signs and the nursing notes.  Pertinent labs & imaging results that were available during my care of the patient were reviewed by me and considered in my medical decision making (see chart for details).     Review of patient's records from recent hospitalization.  Appears creatinine improved to 3 prior to discharge.  Creatinine is now 11.  Patient is making urine.  No evidence of post renal obstruction.  CT with ureteral stones in place.  Discussed with hospitalist will see patient in emergency department and admit.  Final Clinical Impressions(s) / ED Diagnoses   Final diagnoses:  AKI (acute kidney injury) [facility])    ED Discharge Orders    None       Julianne Rice, MD 11/10/17 1521

## 2017-11-09 ENCOUNTER — Inpatient Hospital Stay (HOSPITAL_COMMUNITY): Payer: Medicaid Other

## 2017-11-09 DIAGNOSIS — G40909 Epilepsy, unspecified, not intractable, without status epilepticus: Secondary | ICD-10-CM

## 2017-11-09 DIAGNOSIS — E1140 Type 2 diabetes mellitus with diabetic neuropathy, unspecified: Secondary | ICD-10-CM

## 2017-11-09 DIAGNOSIS — N189 Chronic kidney disease, unspecified: Secondary | ICD-10-CM

## 2017-11-09 DIAGNOSIS — I10 Essential (primary) hypertension: Secondary | ICD-10-CM

## 2017-11-09 DIAGNOSIS — Z794 Long term (current) use of insulin: Secondary | ICD-10-CM

## 2017-11-09 DIAGNOSIS — E872 Acidosis: Secondary | ICD-10-CM

## 2017-11-09 LAB — BASIC METABOLIC PANEL
Anion gap: 15 (ref 5–15)
BUN: 79 mg/dL — ABNORMAL HIGH (ref 8–23)
CO2: 9 mmol/L — ABNORMAL LOW (ref 22–32)
Calcium: 7.7 mg/dL — ABNORMAL LOW (ref 8.9–10.3)
Chloride: 114 mmol/L — ABNORMAL HIGH (ref 98–111)
Creatinine, Ser: 11.68 mg/dL — ABNORMAL HIGH (ref 0.44–1.00)
GFR calc Af Amer: 4 mL/min — ABNORMAL LOW (ref >60)
GFR, EST NON AFRICAN AMERICAN: 3 mL/min — AB (ref >60)
Glucose, Bld: 217 mg/dL — ABNORMAL HIGH (ref 70–99)
POTASSIUM: 5.4 mmol/L — AB (ref 3.5–5.1)
Sodium: 138 mmol/L (ref 135–145)

## 2017-11-09 LAB — GLUCOSE, CAPILLARY
GLUCOSE-CAPILLARY: 168 mg/dL — AB (ref 70–99)
Glucose-Capillary: 219 mg/dL — ABNORMAL HIGH (ref 70–99)
Glucose-Capillary: 231 mg/dL — ABNORMAL HIGH (ref 70–99)
Glucose-Capillary: 180 mg/dL — ABNORMAL HIGH (ref 70–99)
Glucose-Capillary: 170 mg/dL — ABNORMAL HIGH (ref 70–99)

## 2017-11-09 LAB — BLOOD GAS, ARTERIAL
ACID-BASE DEFICIT: 16.3 mmol/L — AB (ref 0.0–2.0)
Bicarbonate: 12.1 mmol/L — ABNORMAL LOW (ref 20.0–28.0)
DRAWN BY: 277331
FIO2: 0.21
O2 Saturation: 95 %
PH ART: 7.272 — AB (ref 7.350–7.450)
Patient temperature: 36
pCO2 arterial: 21 mmHg — ABNORMAL LOW (ref 32.0–48.0)
pO2, Arterial: 82.8 mmHg — ABNORMAL LOW (ref 83.0–108.0)

## 2017-11-09 LAB — LACTIC ACID, PLASMA
LACTIC ACID, VENOUS: 1.3 mmol/L (ref 0.5–1.9)
LACTIC ACID, VENOUS: 0.8 mmol/L (ref 0.5–1.9)

## 2017-11-09 LAB — CBC
HEMATOCRIT: 32.5 % — AB (ref 36.0–46.0)
Hemoglobin: 10.2 g/dL — ABNORMAL LOW (ref 12.0–15.0)
MCH: 28.1 pg (ref 26.0–34.0)
MCHC: 31.4 g/dL (ref 30.0–36.0)
MCV: 89.5 fL (ref 78.0–100.0)
PLATELETS: 524 10*3/uL — AB (ref 150–400)
RBC: 3.63 MIL/uL — ABNORMAL LOW (ref 3.87–5.11)
RDW: 16.8 % — ABNORMAL HIGH (ref 11.5–15.5)
WBC: 15.6 10*3/uL — ABNORMAL HIGH (ref 4.0–10.5)

## 2017-11-09 LAB — CREATININE, URINE, RANDOM: CREATININE, URINE: 40.75 mg/dL

## 2017-11-09 LAB — PROTEIN / CREATININE RATIO, URINE
Creatinine, Urine: 46.26 mg/dL
PROTEIN CREATININE RATIO: 10.4 mg/mg{creat} — AB (ref 0.00–0.15)
TOTAL PROTEIN, URINE: 481 mg/dL

## 2017-11-09 LAB — SODIUM, URINE, RANDOM: SODIUM UR: 106 mmol/L

## 2017-11-09 MED ORDER — INFLUENZA VAC SPLIT QUAD 0.5 ML IM SUSY
0.5000 mL | PREFILLED_SYRINGE | INTRAMUSCULAR | Status: AC
  Administered 2017-11-09: 0.5 mL via INTRAMUSCULAR
  Filled 2017-11-09: qty 0.5

## 2017-11-09 MED ORDER — LEVETIRACETAM 500 MG PO TABS: 1000.0000 mg | ORAL_TABLET | Freq: Two times a day (BID) | ORAL | Status: DC

## 2017-11-09 MED ORDER — LEVETIRACETAM 500 MG PO TABS
500.0000 mg | ORAL_TABLET | Freq: Two times a day (BID) | ORAL | Status: DC
  Administered 2017-11-09 – 2017-11-12 (×6): 500 mg via ORAL
  Filled 2017-11-09 (×8): qty 1

## 2017-11-09 MED ORDER — GLUCERNA SHAKE PO LIQD
237.0000 mL | Freq: Two times a day (BID) | ORAL | Status: DC
  Administered 2017-11-10 – 2017-11-15 (×6): 237 mL via ORAL

## 2017-11-09 MED ORDER — SODIUM ZIRCONIUM CYCLOSILICATE 10 G PO PACK
10.0000 g | PACK | Freq: Two times a day (BID) | ORAL | Status: DC
  Administered 2017-11-09 – 2017-11-10 (×4): 10 g via ORAL
  Filled 2017-11-09 (×7): qty 1

## 2017-11-09 MED ORDER — FUROSEMIDE 10 MG/ML IJ SOLN
100.0000 mg | Freq: Two times a day (BID) | INTRAVENOUS | Status: DC
  Administered 2017-11-09 – 2017-11-13 (×8): 100 mg via INTRAVENOUS
  Filled 2017-11-09 (×15): qty 10

## 2017-11-09 MED ORDER — ENSURE ENLIVE PO LIQD
237.0000 mL | Freq: Two times a day (BID) | ORAL | Status: DC
  Administered 2017-11-09: 237 mL via ORAL

## 2017-11-09 MED ORDER — SODIUM BICARBONATE 8.4 % IV SOLN
INTRAVENOUS | Status: AC
  Filled 2017-11-09: qty 150

## 2017-11-09 MED ORDER — METOPROLOL SUCCINATE ER 25 MG PO TB24
25.0000 mg | ORAL_TABLET | Freq: Every day | ORAL | Status: DC
  Administered 2017-11-09 – 2017-11-12 (×4): 25 mg via ORAL
  Filled 2017-11-09 (×5): qty 1

## 2017-11-09 NOTE — Progress Notes (Signed)
ABG results noted on labs. Text-paged Dr. Dyann Kief to notify. Donavan Foil, RN

## 2017-11-09 NOTE — Progress Notes (Addendum)
Inpatient Diabetes Program Recommendations  AACE/ADA: New Consensus Statement on Inpatient Glycemic Control (2015)  Target Ranges:  Prepandial:   less than 140 mg/dL      Peak postprandial:   less than 180 mg/dL (1-2 hours)      Critically ill patients:  140 - 180 mg/dL   Lab Results  Component Value Date   GLUCAP 231 (H) 11/09/2017   HGBA1C 10.6 (H) 09/19/2013    Review of Glycemic Control Results for Erica [name], Erica [name] (MRN [id number]) as of 11/09/2017 13:00  Ref. Range 11/09/2017 00:05 11/09/2017 07:46 11/09/2017 12:57  Glucose-Capillary Latest Ref Range: 70 - 99 mg/dL 168 (H) 219 (H) 231 (H)   Diabetes history: Type 2 DM Outpatient Diabetes medications: Novolog 4-10 units TID, Tresiba 10 units QAM Current orders for Inpatient glycemic control: Novolog 0-9 units TID  Inpatient Diabetes Program Recommendations:    Of note, per Care Everywhere, A1C 12.1% (10/07/2017).   Additionally, patient on basal insulin at home. Consider adding Levemir 6 units QD.  Will plan to speak with patient today.  Addendum@1400 : Briefly spoke with patient via phone (working from [employer]) regarding outpatient diabetes. Patient checks blood sugars and they run 240-250's mg/dL and her PCP helps manage. Patient verifies taking her home medications. Reviewed patient's current A1c of 12.1%. Explained what a A1c is and what it measures. Also reviewed goal A1c with patient, importance of good glucose control @ home, and blood sugar goals.  When asked about diet, patient admits to drinking sugary beverages such as soda and Gatorade. Patient then hung up the phone. Unable to reach patient after this discussion.   Thanks, Bronson Curb, MSN, RNC-OB Diabetes Coordinator [phone] (8a-5p)

## 2017-11-09 NOTE — Progress Notes (Signed)
Patient lost IV access this am. Hard IV stick per report from night shift RN. Notified Dr. Dyann Kief. PICC line cannot be placed due to elevated creatinine. Dr. Dyann Kief stated he has discussed with Dr. Arnoldo Morale about need for central line access, unsure of what time it could be placed. Discussed with him that vascular access team can place IJ if possible. Order received for vascular access team consult for IJ placement. Order placed. Notified vascular access team rep of consult. Stated she will call back with ETA. Donavan Foil, RN

## 2017-11-09 NOTE — Progress Notes (Signed)
Vascular access team rep called back with ETA of around 1030 for IJ placement. Notified MD. Donavan Foil, RN

## 2017-11-09 NOTE — Progress Notes (Signed)
IJ placement verified by CXR this afternoon. Okay to use per Dr. Thornton Papas in radiology once report read. Flushed well, good blood return. Donavan Foil, RN

## 2017-11-09 NOTE — Progress Notes (Signed)
PROGRESS NOTE    MALLORIE NORROD  SAY:[id number] DOB: 08/06/1955 DOA: 11/08/2017 PCP: Octavio Graves, DO    Brief Narrative:  62 year old female with a past medical history significant for diabetes, hypertension and seizure disorders and recent admission with insulin hospital secondary to pyelonephritis and hydronephrosis.  At that time patient had bilateral stenting and was discharged home with anticipated improvement of renal function.  Over the last 3 weeks she has been increasingly weak, with intermittent episode of confusion and having poor appetite.  On admission patient has been found with a creatinine close to 12, potassium of 5.4 and BUN of 81. UA with positive leukocytes sterease and turbid appearance; culture pending.  Assessment & Plan: Acute on chronic Renal failure -in the setting of pre-renal azotemia, ATN, continue use of nephrotoxic agents and UTI. -UA suggesting infection. -no hydronephrosis seen on CT scan -severe metabolic acidosis due to renal failure. Bicarb 9 -will follow nephrology rec's, start bicarb drip and lasix -stop nephrotoxic agents -follow renal function trend and electrolytes.  Hyperkalemia -started on Lokalma -monitor on telemetry -follow renal service rec's  Diabetes mellitus ([place]) -continue SSI -continue holding oral hypoglycemic agents  Metabolic acidosis -treatment As mentioned above   -in the setting of renal failure  Seizure disorder ([place]) -seizure precaution -continue keppra  HTN -continue toprol, but at 25 mg daily; mainly to prevent rebound tachycardia.   -follow VS   DVT prophylaxis: SCD's  Code Status: Full Family Communication: Son over the phone. Disposition Plan: Remains inpatient, place a central line, continue sodium bicarb, fluid resuscitation and IV Lasix as recommended by nephrologist.  Dependent improvement in her renal function she may end the requiring dialysis.  Consultants:   Nephrology   Procedures:    See below for x-ray reports.   Antimicrobials:  Anti-infectives (From admission, onward)   Start     Dose/Rate Route Frequency Ordered Stop   11/08/17 2200  cefTRIAXone (ROCEPHIN) 1 g in sodium chloride 0.9 % 100 mL IVPB     1 g 200 mL/hr over 30 Minutes Intravenous Every 24 hours 11/08/17 2148        Subjective: Afebrile, no chest pain, no vomiting, no abdominal pain, currently denies shortness of breath.  Patient is oriented x3.  She reports some intermittent nausea earlier today and had also noticed reduction in her urine output.  Objective: Vitals:   11/08/17 2247 11/08/17 2349 11/09/17 0604 11/09/17 1453  BP: 121/64 118/68 (!) 109/55 119/64  Pulse: 80 82 93 88  Resp: 16 18 16 16   Temp:  98.1 F (36.7 C) 98.6 F (37 C) 98.4 F (36.9 C)  TempSrc:  Oral Oral Oral  SpO2: 100% 100% 100% 100%  Weight:  43.1 kg    Height:  5' (1.524 m)      Intake/Output Summary (Last 24 hours) at 11/09/2017 1507 Last data filed at 11/09/2017 1325 Gross per 24 hour  Intake 1382.35 ml  Output -  Net 1382.35 ml   Filed Weights   11/08/17 2349  Weight: 43.1 kg    Examination: General exam: Alert, awake, oriented x 3; reports feeling nauseated in am; no CP, no SOB, no vomiting. Patient also expressed reduction in urine output. Respiratory system: Clear to auscultation. Respiratory effort normal. Cardiovascular system:RRR. No murmurs, rubs, gallops. Gastrointestinal system: Abdomen is nondistended, soft and nontender. No organomegaly or masses felt. Normal bowel sounds heard. Central nervous system: Alert and oriented. No focal neurological deficits. Extremities: No C/C/E, +pedal pulses Skin: No rashes, lesions or  ulcers Psychiatry: Judgement and insight appear normal. Mood & affect appropriate.     Data Reviewed: I have personally reviewed following labs and imaging studies  CBC: Recent Labs  Lab 11/08/17 1900 11/09/17 0438  WBC 15.8* 15.6*  NEUTROABS 12.5*  --   HGB 10.1*  10.2*  HCT 32.1* 32.5*  MCV 88.9 89.5  PLT 533* 619*   Basic Metabolic Panel: Recent Labs  Lab 11/08/17 1900 11/09/17 0438  NA 137 138  K 5.3* 5.4*  CL 113* 114*  CO2 12* 9*  GLUCOSE 214* 217*  BUN 81* 79*  CREATININE 11.93* 11.68*  CALCIUM 8.0* 7.7*   GFR: Estimated Creatinine Clearance: 3.4 mL/min (A) (by C-G formula based on SCr of 11.68 mg/dL (H)).   Liver Function Tests: Recent Labs  Lab 11/08/17 1900  AST 12*  ALT 8  ALKPHOS 90  BILITOT 0.5  PROT 7.8  ALBUMIN 2.4*   CBG: Recent Labs  Lab 11/09/17 0005 11/09/17 0746 11/09/17 1257  GLUCAP 168* 219* 231*   Urine analysis:    Component Value Date/Time   COLORURINE YELLOW 11/08/2017 2007   APPEARANCEUR TURBID (A) 11/08/2017 2007   LABSPEC 1.009 11/08/2017 2007   PHURINE 6.0 11/08/2017 2007   GLUCOSEU NEGATIVE 11/08/2017 2007   HGBUR MODERATE (A) 11/08/2017 2007   BILIRUBINUR NEGATIVE 11/08/2017 2007   KETONESUR NEGATIVE 11/08/2017 2007   PROTEINUR 100 (A) 11/08/2017 2007   UROBILINOGEN 0.2 07/25/2014 2309   NITRITE NEGATIVE 11/08/2017 2007   LEUKOCYTESUR MODERATE (A) 11/08/2017 2007    Radiology Studies: US Renal  Result Date: 11/09/2017 CLINICAL DATA:  62 year old female with chronic kidney disease stage 3. Subsequent encounter. EXAM: RENAL / URINARY TRACT ULTRASOUND COMPLETE COMPARISON:  11/08/2017 CT. FINDINGS: Right Kidney: Length: 10.1 cm. Minimal increased echogenicity. No hydronephrosis or mass. Left Kidney: Length: 10.5 cm. Echogenicity within normal limits. No mass or hydronephrosis visualized. Bladder: Debris within the bladder.  Bilateral ureteral stents in place. IMPRESSION: 1. Bilateral ureteral stents in place.  No hydronephrosis. 2. Debris within the urinary bladder. 3. Slight increased echogenicity right renal parenchyma, may reflect changes of medical renal disease. Electronically Signed   By: Genia Del M.D.   On: 11/09/2017 14:54   Dg Chest Port 1 View  Result Date:  11/09/2017 CLINICAL DATA:  PICC line placement EXAM: PORTABLE CHEST 1 VIEW COMPARISON:  10/06/2017 FINDINGS: Left internal jugular PICC line is been placed with the tip at the cavoatrial junction. No pneumothorax. Heart is normal size. Lungs clear. No effusions or acute bony abnormality. IMPRESSION: Left PICC line tip at the cavoatrial junction.  No pneumothorax. Electronically Signed   By: Rolm Baptise M.D.   On: 11/09/2017 12:58   Ct Renal Stone Study  Result Date: 11/08/2017 CLINICAL DATA:  62 year old female with UTI. History of kidney stones. Displacement of indwelling urethral stent. EXAM: CT ABDOMEN AND PELVIS WITHOUT CONTRAST TECHNIQUE: Multidetector CT imaging of the abdomen and pelvis was performed following the standard protocol without IV contrast. COMPARISON:  CT of the abdomen pelvis dated 12/07/2012 FINDINGS: Evaluation of this exam is limited in the absence of intravenous contrast. Lower chest: Bibasilar linear atelectasis/scarring. There is calcification of the mitral annulus. There is hypoattenuation of the cardiac blood pool suggestive of a degree of anemia. Clinical correlation is recommended. No intra-abdominal free air. Small ascites and diffuse mesenteric edema. Hepatobiliary: The liver is unremarkable. There is mild intrahepatic biliary ductal dilatation as seen on the prior CT. The gallbladder is not visualized, likely surgically absent. Pancreas: The  pancreas is atrophic with coarse calcifications likely sequela of chronic pancreatitis. Spleen: Normal in size without focal abnormality. Adrenals/Urinary Tract: The adrenal glands are unremarkable. Small faint hypodensities within the renal collecting systems on likely represent small nonobstructing calculi versus residual excreted contrast from recent IV administration. Clinical correlation is recommended. Bilateral pigtail ureteral stents noted. The proximal tip of the right ureteral stent is in the right renal upper pole collecting  system and the proximal tip of the left ureteral stent is in the left renal pelvis. The distal ends of the stents are within the urinary bladder. There is no hydronephrosis on either side. Stomach/Bowel: There is diffuse thickened appearance of the wall of the stomach as well as thickened appearance of the colon, likely related to ascites. Gastritis or colitis are considered less likely. Clinical correlation is recommended. There is no bowel obstruction. The appendix is normal. Vascular/Lymphatic: The abdominal aorta and IVC are grossly unremarkable on this noncontrast CT. No portal venous gas. Top-normal left retroperitoneal lymph nodes. Reproductive: Vascular calcification of the peripheral uterus noted. No pelvic mass. Other: Diffuse subcutaneous edema and anasarca. Musculoskeletal: There is degenerative changes of the spine. Grade 1 L4-L5 anterolisthesis. There is compression fracture of the superior endplate of the L2, age indeterminate but new since the prior CT of 2014. Compression fracture of the inferior endplate of F00 and F12. The T12 compression fracture is new since the prior CT. There is no retropulsed fragment. IMPRESSION: 1. Bilateral ureteral stents in place as described. No hydronephrosis. 2. Probable small bilateral renal calculi versus residual contrast from recent study. 3. Small ascites, diffuse edema, and anasarca of indeterminate etiology. Clinical correlation is recommended. 4. Thickened stomach and colon, likely related to ascites. Electronically Signed   By: Anner Crete M.D.   On: 11/08/2017 21:41    Scheduled Meds: . feeding supplement (ENSURE ENLIVE)  237 mL Oral BID BM  . insulin aspart  0-9 Units Subcutaneous TID WC  . sodium zirconium cyclosilicate  10 g Oral BID   Continuous Infusions: . cefTRIAXone (ROCEPHIN)  IV Stopped (11/08/17 2317)  . furosemide 100 mg (11/09/17 1320)  .  sodium bicarbonate (isotonic) infusion in sterile water 125 mL/hr at 11/09/17 1449      LOS: 1 day    Time spent: 30 minutes.   Barton Dubois, MD [facility] Pager [phone]  If 7PM-7AM, please contact night-coverage [email or web address] Password [facility] 11/09/2017, 3:07 PM

## 2017-11-09 NOTE — Consult Note (Signed)
Reason for Consult: Acute kidney injury superimposed on chronic Referring Physician: Dr. Rutherford Guys is an 62 y.o. female.  HPI: She is a patient who has history of hypertension, diabetes, history of pyelonephritis initially admitted about a month ago at [facility].  During that time patient was found to have bilateral hydronephrosis and stent was placed.  According the patient she was discharged about 3 weeks ago.  During that time her creatinine has come down to around 4.  Since her discharge patient was complaining of some weakness.  She started having some diarrhea about 3 days ago.  She had about 3-4 times a day.  She did not have any nausea or vomiting.  Her appetite however was poor.  She was complaining of weakness, abdominal pain and dysuria is decided to come to the hospital.  Presently her creatinine has increased significantly and so consult is called.  Past Medical History:  Diagnosis Date  . Alcohol-induced pancreatitis   . Chronic diarrhea   . Depression   . Diabetes mellitus    fasting blood sugar 110-120s  . Diastolic CHF ([place])   . DKA (diabetic ketoacidoses) ([place])   . Gastroparesis   . GERD (gastroesophageal reflux disease)   . Heart murmur   . History of kidney stones   . Hyperlipidemia   . Hypertension   . Hypokalemia   . Muscle spasm   . Neuropathic pain   . Neuropathy    Hx: of  . Pyelonephritis   . Recurrent pancreatitis   . Seizures ([place])   . Vitamin B12 deficiency   . Vitamin D deficiency     Past Surgical History:  Procedure Laterality Date  . CATARACT EXTRACTION W/PHACO Right 03/14/2015   Procedure: CATARACT EXTRACTION PHACO AND INTRAOCULAR LENS PLACEMENT (IOC);  Surgeon: Baruch Goldmann, MD;  Location: [facility];  Service: Ophthalmology;  Laterality: Right;  CDE:11.13  . CATARACT EXTRACTION W/PHACO Left 04/11/2015   Procedure: CATARACT EXTRACTION PHACO AND INTRAOCULAR LENS PLACEMENT LEFT EYE CDE=9.68;  Surgeon: Baruch Goldmann, MD;  Location:  [facility];  Service: Ophthalmology;  Laterality: Left;  . COLONOSCOPY  02/24/2010  . MULTIPLE EXTRACTIONS WITH ALVEOLOPLASTY N/A 06/13/2012   Procedure: MULTIPLE EXTRACION WITH ALVEOLOPLASTY EXTRACT: 18, 19, 20, 21, 22, 24, 25, 27, 28, 29, 30, 31;  Surgeon: Gae Bon, DDS;  Location: [place];  Service: Oral Surgery;  Laterality: N/A;  . TUBAL LIGATION    . URETERAL STENT PLACEMENT  09/2017    Family History  Problem Relation Age of Onset  . Diabetes Sister   . Chronic Renal Failure Neg Hx     Social History:  reports that she has never smoked. Her smokeless tobacco use includes snuff. She reports that she drinks about 1.0 standard drinks of alcohol per week. She reports that she does not use drugs.  Allergies:  Allergies  Allergen Reactions  . Aspirin Palpitations    Medications: I have reviewed the patient's current medications.  Results for orders placed or performed during the hospital encounter of 11/08/17 (from the past 48 hour(s))  CBC with Differential     Status: Abnormal   Collection Time: 11/08/17  7:00 PM  Result Value Ref Range   WBC 15.8 (H) 4.0 - 10.5 K/uL   RBC 3.61 (L) 3.87 - 5.11 MIL/uL   Hemoglobin 10.1 (L) 12.0 - 15.0 g/dL   HCT 32.1 (L) 36.0 - 46.0 %   MCV 88.9 78.0 - 100.0 fL   MCH 28.0 26.0 - 34.0  pg   MCHC 31.5 30.0 - 36.0 g/dL   RDW 16.6 (H) 11.5 - 15.5 %   Platelets 533 (H) 150 - 400 K/uL   Neutrophils Relative % 79 %   Neutro Abs 12.5 (H) 1.7 - 7.7 K/uL   Lymphocytes Relative 12 %   Lymphs Abs 1.9 0.7 - 4.0 K/uL   Monocytes Relative 9 %   Monocytes Absolute 1.4 (H) 0.1 - 1.0 K/uL   Eosinophils Relative 0 %   Eosinophils Absolute 0.0 0.0 - 0.7 K/uL   Basophils Relative 0 %   Basophils Absolute 0.0 0.0 - 0.1 K/uL    Comment: Performed at [facility], [address], [place], [place] [address]  Comprehensive metabolic panel     Status: Abnormal   Collection Time: 11/08/17  7:00 PM  Result Value Ref Range   Sodium 137 135 - 145 mmol/L    Potassium 5.3 (H) 3.5 - 5.1 mmol/L   Chloride 113 (H) 98 - 111 mmol/L   CO2 12 (L) 22 - 32 mmol/L   Glucose, Bld 214 (H) 70 - 99 mg/dL   BUN 81 (H) 8 - 23 mg/dL   Creatinine, Ser 11.93 (H) 0.44 - 1.00 mg/dL   Calcium 8.0 (L) 8.9 - 10.3 mg/dL   Total Protein 7.8 6.5 - 8.1 g/dL   Albumin 2.4 (L) 3.5 - 5.0 g/dL   AST 12 (L) 15 - 41 U/L   ALT 8 0 - 44 U/L   Alkaline Phosphatase 90 38 - 126 U/L   Total Bilirubin 0.5 0.3 - 1.2 mg/dL   GFR calc non Af Amer 3 (L) >60 mL/min   GFR calc Af Amer 3 (L) >60 mL/min    Comment: (NOTE) The eGFR has been calculated using the CKD EPI equation. This calculation has not been validated in all clinical situations. eGFR's persistently <60 mL/min signify possible Chronic Kidney Disease.    Anion gap 12 5 - 15    Comment: Performed at [facility], [address] [place], [place] [address]  Urinalysis, Routine w reflex microscopic     Status: Abnormal   Collection Time: 11/08/17  8:07 PM  Result Value Ref Range   Color, Urine YELLOW YELLOW   APPearance TURBID (A) CLEAR   Specific Gravity, Urine 1.009 1.005 - 1.030   pH 6.0 5.0 - 8.0   Glucose, UA NEGATIVE NEGATIVE mg/dL   Hgb urine dipstick MODERATE (A) NEGATIVE   Bilirubin Urine NEGATIVE NEGATIVE   Ketones, ur NEGATIVE NEGATIVE mg/dL   Protein, ur 100 (A) NEGATIVE mg/dL   Nitrite NEGATIVE NEGATIVE   Leukocytes, UA MODERATE (A) NEGATIVE   RBC / HPF >50 (H) 0 - 5 RBC/hpf   WBC, UA >50 (H) 0 - 5 WBC/hpf   Bacteria, UA NONE SEEN NONE SEEN    Comment: Performed at [facility], [address] [place], [place] [address]  Creatinine, urine, random     Status: None   Collection Time: 11/08/17  8:07 PM  Result Value Ref Range   Creatinine, Urine 40.75 mg/dL    Comment: Performed at [facility], [address], [place], [place] [address]  Protein / creatinine ratio, urine     Status: Abnormal   Collection Time: 11/08/17  8:07 PM  Result Value Ref Range   Creatinine, Urine 46.26 mg/dL   Total Protein,  Urine 481 mg/dL    Comment: NO NORMAL RANGE ESTABLISHED FOR THIS TEST   Protein Creatinine Ratio 10.40 (H) 0.00 - 0.15 mg/mg[Cre]  Comment: Performed at [facility], [address] [place], [place] [address]  Sodium, urine, random     Status: None   Collection Time: 11/08/17  8:07 PM  Result Value Ref Range   Sodium, Ur 106 mmol/L    Comment: Performed at [facility], [address] [place], [place] [address]  Glucose, capillary     Status: Abnormal   Collection Time: 11/09/17 12:05 AM  Result Value Ref Range   Glucose-Capillary 168 (H) 70 - 99 mg/dL   Comment 1 Notify RN    Comment 2 Document in Chart   Basic metabolic panel     Status: Abnormal   Collection Time: 11/09/17  4:38 AM  Result Value Ref Range   Sodium 138 135 - 145 mmol/L   Potassium 5.4 (H) 3.5 - 5.1 mmol/L   Chloride 114 (H) 98 - 111 mmol/L   CO2 9 (L) 22 - 32 mmol/L   Glucose, Bld 217 (H) 70 - 99 mg/dL   BUN 79 (H) 8 - 23 mg/dL   Creatinine, Ser 11.68 (H) 0.44 - 1.00 mg/dL   Calcium 7.7 (L) 8.9 - 10.3 mg/dL   GFR calc non Af Amer 3 (L) >60 mL/min   GFR calc Af Amer 4 (L) >60 mL/min    Comment: (NOTE) The eGFR has been calculated using the CKD EPI equation. This calculation has not been validated in all clinical situations. eGFR's persistently <60 mL/min signify possible Chronic Kidney Disease.    Anion gap 15 5 - 15    Comment: Performed at [facility], [address] [place], [place] [address]  CBC     Status: Abnormal   Collection Time: 11/09/17  4:38 AM  Result Value Ref Range   WBC 15.6 (H) 4.0 - 10.5 K/uL   RBC 3.63 (L) 3.87 - 5.11 MIL/uL   Hemoglobin 10.2 (L) 12.0 - 15.0 g/dL   HCT 32.5 (L) 36.0 - 46.0 %   MCV 89.5 78.0 - 100.0 fL   MCH 28.1 26.0 - 34.0 pg   MCHC 31.4 30.0 - 36.0 g/dL   RDW 16.8 (H) 11.5 - 15.5 %   Platelets 524 (H) 150 - 400 K/uL    Comment: Performed at [facility], [address], [place], [place] [address]  Glucose, capillary     Status: Abnormal   Collection Time:  11/09/17  7:46 AM  Result Value Ref Range   Glucose-Capillary 219 (H) 70 - 99 mg/dL   Comment 1 Notify RN    Comment 2 Document in Chart     Ct Renal Stone Study  Result Date: 11/08/2017 CLINICAL DATA:  62 year old female with UTI. History of kidney stones. Displacement of indwelling urethral stent. EXAM: CT ABDOMEN AND PELVIS WITHOUT CONTRAST TECHNIQUE: Multidetector CT imaging of the abdomen and pelvis was performed following the standard protocol without IV contrast. COMPARISON:  CT of the abdomen pelvis dated 12/07/2012 FINDINGS: Evaluation of this exam is limited in the absence of intravenous contrast. Lower chest: Bibasilar linear atelectasis/scarring. There is calcification of the mitral annulus. There is hypoattenuation of the cardiac blood pool suggestive of a degree of anemia. Clinical correlation is recommended. No intra-abdominal free air. Small ascites and diffuse mesenteric edema. Hepatobiliary: The liver is unremarkable. There is mild intrahepatic biliary ductal dilatation as seen on the prior CT. The gallbladder is not visualized, likely surgically absent. Pancreas: The pancreas is atrophic with coarse calcifications likely sequela of chronic pancreatitis. Spleen: Normal in size without focal abnormality. Adrenals/Urinary Tract: The  adrenal glands are unremarkable. Small faint hypodensities within the renal collecting systems on likely represent small nonobstructing calculi versus residual excreted contrast from recent IV administration. Clinical correlation is recommended. Bilateral pigtail ureteral stents noted. The proximal tip of the right ureteral stent is in the right renal upper pole collecting system and the proximal tip of the left ureteral stent is in the left renal pelvis. The distal ends of the stents are within the urinary bladder. There is no hydronephrosis on either side. Stomach/Bowel: There is diffuse thickened appearance of the wall of the stomach as well as thickened  appearance of the colon, likely related to ascites. Gastritis or colitis are considered less likely. Clinical correlation is recommended. There is no bowel obstruction. The appendix is normal. Vascular/Lymphatic: The abdominal aorta and IVC are grossly unremarkable on this noncontrast CT. No portal venous gas. Top-normal left retroperitoneal lymph nodes. Reproductive: Vascular calcification of the peripheral uterus noted. No pelvic mass. Other: Diffuse subcutaneous edema and anasarca. Musculoskeletal: There is degenerative changes of the spine. Grade 1 L4-L5 anterolisthesis. There is compression fracture of the superior endplate of the L2, age indeterminate but new since the prior CT of 2014. Compression fracture of the inferior endplate of H47 and Q25. The T12 compression fracture is new since the prior CT. There is no retropulsed fragment. IMPRESSION: 1. Bilateral ureteral stents in place as described. No hydronephrosis. 2. Probable small bilateral renal calculi versus residual contrast from recent study. 3. Small ascites, diffuse edema, and anasarca of indeterminate etiology. Clinical correlation is recommended. 4. Thickened stomach and colon, likely related to ascites. Electronically Signed   By: Anner Crete M.D.   On: 11/08/2017 21:41    Review of Systems  Constitutional: Positive for chills, malaise/fatigue and weight loss.  Respiratory: Negative for cough and shortness of breath.   Cardiovascular: Negative for chest pain, palpitations and leg swelling.  Gastrointestinal: Positive for abdominal pain and diarrhea. Negative for nausea and vomiting.  Genitourinary: Positive for dysuria.  Neurological: Positive for weakness. Negative for dizziness.   Blood pressure (!) 109/55, pulse 93, temperature 98.6 F (37 C), temperature source Oral, resp. rate 16, height 5' (1.524 m), weight 43.1 kg, SpO2 100 %. Physical Exam  Constitutional: She is oriented to person, place, and time. No distress.  Looks  emaciated  Eyes: No scleral icterus.  Neck: No JVD present.  Cardiovascular: Normal rate and regular rhythm.  Respiratory: No respiratory distress. She has no wheezes.  GI: She exhibits no distension. There is no tenderness. There is no rebound.  Musculoskeletal: She exhibits no edema.  Neurological: She is alert and oriented to person, place, and time.    Assessment/Plan: 1] acute kidney injury superimposed on chronic.  Presently her creatinine has increased within the last 3 weeks.  CT scan of the abdomen did reveal any obstructive uropathy.  She had 2 stent bilaterally.  The etiology could be prerenal syndrome/ATN/ACE inhibitor. 2] patient came with dysuria.  Patient is a febrile but her white blood cell count is elevated.  At this moment she is on antibiotic for UTI.  Culture is pending. 3] hyperkalemia: Possibly from high potassium intake/worsening of renal failure/ACE inhibitor/potassium supplement. 4] low CO2: Possibly metabolic.  At this moment ABG is not available. 5] anemia: Seems to be long-standing. 6] hypertension: Her blood pressure is reasonably controlled 7] diabetes: Presently patient denies any polydipsia or polyuria. Plan: 1] we will check her ABG 2] we will check lactic acid level 3] we will continue with hydration  4] we will check a renal panel and CBC in the morning. 5] we will start on Lokalma 10 mg p.o. twice daily  Tavionna Grout S 11/09/2017, 8:18 AM

## 2017-11-09 NOTE — Progress Notes (Signed)
Initial Nutrition Assessment  DOCUMENTATION CODES:   Severe malnutrition in context of chronic illness  INTERVENTION:  Provided brief review of healthful diet focused on lean protein, fresh fruits and vegetables with emphasis on regular meal intake.  Discontinued Ensure Enlive BID due to hyperglycemia  Start Glucerna Shakes po BID, each supplement provides 220 kcal and 10 grams of protein  Recommend MD consider adding renal vitamin   NUTRITION DIAGNOSIS:   Severe Malnutrition related to chronic illness(declining renal fuction -Cr.10.8, BUN 80. Pending diaylsis) as evidenced by energy intake < or equal to 75% for > or equal to 1 month, moderate muscle depletion, severe muscle depletion, moderate fat depletion, per patient/family report and physical exam.   GOAL:   Patient will meet greater than or equal to 90% of their needs  MONITOR:   PO intake, Supplement acceptance, Labs, Weight trends, I & O's  REASON FOR ASSESSMENT:   Malnutrition Screening Tool    ASSESSMENT:  Patient is a pleasant 62 yo female with hx of DM, HTN, Vitamin D deficiency, CHF and GERD. She presents with acute on chronic renal failure and urinary infection. Appetite is poor which is not surprising given her compromised renal function.     Patient home diet is regular and has been purchasing and preparing her own food. She is edentulous. Her usual intake consists of coffee only for breakfast. Around 12-1 pm often has toast and vienna sausages, a favorite dinner meal is spaghetti and she likes potatoes. Primary beverage regular soft drinks. Quality of usual intake little to no-nutrient density. Provided brief review of healthful diet focused on lean protein, fresh fruits and vegetables with emphasis on regular meal intake.  Patient weight has been  40-48 kg for the past 5 years. Most recently 43-46 kg for 2.5 years. Chronic undernutrition based on diet history. Patient likes the Glucerna - encouraged 100%  acceptance daily while she is here and to continue at home.  Physical exam- moderate to severe loss of muscle and fat stores. No BLE edema present.     Last available: Lab Results  Component Value Date   HGBA1C 10.6 (H) 09/19/2013   CBG (last 3)  Recent Labs    11/09/17 2141 11/10/17 0735 11/10/17 1115  GLUCAP 170* 355* 275*     Labs: BMP Latest Ref Rng & Units 11/10/2017 11/09/2017 11/08/2017  Glucose 70 - 99 mg/dL 389(H) 217(H) 214(H)  BUN 8 - 23 mg/dL 80(H) 79(H) 81(H)  Creatinine 0.44 - 1.00 mg/dL 10.78(H) 11.68(H) 11.93(H)  Sodium 135 - 145 mmol/L 135 138 137  Potassium 3.5 - 5.1 mmol/L 4.2 5.4(H) 5.3(H)  Chloride 98 - 111 mmol/L 102 114(H) 113(H)  CO2 22 - 32 mmol/L 19(L) 9(L) 12(L)  Calcium 8.9 - 10.3 mg/dL 6.6(L) 7.7(L) 8.0(L)   Medications:  Lasix 100 mg in D5 BID daily and metoprolol, keppra.   NUTRITION - FOCUSED PHYSICAL EXAM:    Most Recent Value  Orbital Region  Moderate depletion  Upper Arm Region  Mild depletion  Buccal Region  Moderate depletion  Temple Region  Severe depletion  Clavicle Bone Region  Moderate depletion  Clavicle and Acromion Bone Region  Severe depletion  Dorsal Hand  Moderate depletion  Patellar Region  Severe depletion  Anterior Thigh Region  Severe depletion  Posterior Calf Region  Severe depletion  Edema (RD Assessment)  None  Hair  Reviewed  Eyes  Reviewed  Mouth  Reviewed  Skin  Reviewed  Nails  Reviewed  Diet Order:   Diet Order            Diet heart healthy/carb modified Room service appropriate? Yes; Fluid consistency: Thin  Diet effective now              EDUCATION NEEDS:  Education needs have been addressed   Skin:  Skin Assessment: Reviewed RN Assessment  Last BM:  11/08/17  Height:   Ht Readings from Last 1 Encounters:  11/08/17 5' (1.524 m)    Weight:   Wt Readings from Last 1 Encounters:  11/08/17 43.1 kg    Ideal Body Weight:  45 kg  BMI:  Body mass index is 18.56 kg/m.  Estimated  Nutritional Needs:   Kcal:  7670-1100  (33-38 kcal/kg/bw)  Protein:  30-34 gr (0.7-0.8 gr/kg/bw)  Fluid:  1.3-1.4 liters daily   Colman Cater MS,RD,CSG,LDN Office: [phone] Pager: [phone]

## 2017-11-10 DIAGNOSIS — N179 Acute kidney failure, unspecified: Secondary | ICD-10-CM

## 2017-11-10 DIAGNOSIS — N170 Acute kidney failure with tubular necrosis: Principal | ICD-10-CM

## 2017-11-10 DIAGNOSIS — Z794 Long term (current) use of insulin: Secondary | ICD-10-CM

## 2017-11-10 DIAGNOSIS — E1165 Type 2 diabetes mellitus with hyperglycemia: Secondary | ICD-10-CM

## 2017-11-10 DIAGNOSIS — N189 Chronic kidney disease, unspecified: Secondary | ICD-10-CM

## 2017-11-10 LAB — GLUCOSE, CAPILLARY
GLUCOSE-CAPILLARY: 275 mg/dL — AB (ref 70–99)
GLUCOSE-CAPILLARY: 160 mg/dL — AB (ref 70–99)
GLUCOSE-CAPILLARY: 87 mg/dL (ref 70–99)
Glucose-Capillary: 355 mg/dL — ABNORMAL HIGH (ref 70–99)

## 2017-11-10 LAB — RENAL FUNCTION PANEL
ANION GAP: 14 (ref 5–15)
Albumin: 1.7 g/dL — ABNORMAL LOW (ref 3.5–5.0)
BUN: 80 mg/dL — ABNORMAL HIGH (ref 8–23)
CHLORIDE: 102 mmol/L (ref 98–111)
CO2: 19 mmol/L — AB (ref 22–32)
Calcium: 6.6 mg/dL — ABNORMAL LOW (ref 8.9–10.3)
Creatinine, Ser: 10.78 mg/dL — ABNORMAL HIGH (ref 0.44–1.00)
GFR calc non Af Amer: 3 mL/min — ABNORMAL LOW (ref >60)
GFR, EST AFRICAN AMERICAN: 4 mL/min — AB (ref >60)
Glucose, Bld: 389 mg/dL — ABNORMAL HIGH (ref 70–99)
Phosphorus: 4.9 mg/dL — ABNORMAL HIGH (ref 2.5–4.6)
Potassium: 4.2 mmol/L (ref 3.5–5.1)
Sodium: 135 mmol/L (ref 135–145)

## 2017-11-10 LAB — HIV ANTIBODY (ROUTINE TESTING W REFLEX): HIV SCREEN 4TH GENERATION: NONREACTIVE

## 2017-11-10 MED ORDER — INSULIN GLARGINE 100 UNIT/ML ~~LOC~~ SOLN
10.0000 [IU] | Freq: Every day | SUBCUTANEOUS | Status: DC
  Administered 2017-11-10 – 2017-11-11 (×2): 10 [IU] via SUBCUTANEOUS
  Filled 2017-11-10 (×3): qty 0.1

## 2017-11-10 NOTE — Progress Notes (Signed)
Inpatient Diabetes Program Recommendations  AACE/ADA: New Consensus Statement on Inpatient Glycemic Control (2015)  Target Ranges:  Prepandial:   less than 140 mg/dL      Peak postprandial:   less than 180 mg/dL (1-2 hours)      Critically ill patients:  140 - 180 mg/dL   Results for EMSLEE, LOPEZMARTINEZ (MRN [id number]) as of 11/10/2017 10:49  Ref. Range 11/09/2017 07:46 11/09/2017 12:57 11/09/2017 16:05 11/09/2017 21:41  Glucose-Capillary Latest Ref Range: 70 - 99 mg/dL 219 (H)  3 units NOVOLOG  231 (H)  3 units NOVOLOG  180 (H)  2 units NOVOLOG  170 (H)   Results for MALLI, FALOTICO (MRN [id number]) as of 11/10/2017 10:49  Ref. Range 11/10/2017 07:35  Glucose-Capillary Latest Ref Range: 70 - 99 mg/dL 355 (H)  9 units NOVOLOG     Home DM Meds: Novolog 4-10 units TID       Tresiba (insulin degludec) 10 units QAM  Current Orders: Novolog Sensitive Correction Scale/ SSI (0-9 units) TID AC       MD- CBG this AM 355 mg/dl  Please consider the following in-hospital insulin adjustments:  Start Lantus 10 units Daily (home dose)--please start this AM      --Will follow patient during hospitalization--  Wyn Quaker RN, MSN, CDE Diabetes Coordinator Inpatient Glycemic Control Team Team Pager: [phone] (8a-5p)

## 2017-11-10 NOTE — Progress Notes (Signed)
PROGRESS NOTE  Erica [name] SJG:[id number] DOB: 1955-11-13 DOA: 11/08/2017 PCP: Octavio Graves, DO  Brief History:  62 year old female with a history of diabetes mellitus, hypertension, seizure disorder presenting with generalized weakness x2 to 3 days with confusion.  The patient was also complaining of lower abd pain with dysuria.    The patient was recently hospitalized at [facility] from 10/07/2017 through 10/21/2017 during which she was treated for emphysematous pyelonephritis and acute on chronic renal failure.  She also was treated for DKA and fluid overload.  She was noted to have hemoglobin A1c of 12.1.  She required transfusion 2 u PRBC without evidence of active bleeding.  She underwent bilateral ureteral stenting on 10/07/2017.  She was treated appropriately with antibiotics.  She was discharged with instructions for outpatient cystoscopy and removal of stents in 2 to 3 weeks.  Upon admission, the patient has serum creatinine of 8.1.  At the time of discharge, her serum creatinine has stabilized between 3.7-3.9.  Nephrology was consulted to assist with management.  Assessment/Plan: Acute on chronic renal failure--stage unspecified presently -Appreciate nephrology consult -Continue Lasix drip per nephrology -Continue bicarbonate drip per nephrology  Diabetes mellitus type 2, uncontrolled with hyperglycemia -Restart Lantus 10 units daily -NovoLog sliding scale  -10/07/17 A1C = 12.1  Essential hypertension -Continue metoprolol succinate -Holding amlodipine secondary to soft blood pressure  Metabolic acidosis -Secondary to acute on chronic renal failure -Continue bicarbonate drip per nephrology  Pyelonephritis -11/08/17 urine culture = Ecoli -continue ceftriaxone -11/08/2017 CT renal protocol--bilateral ureteral stents without hydronephrosis.  Small bilateral renal calculi.  Small amount of ascites with anasarca.  Thickened stomach may be related to  ascites.  Seizure disorder -continue keppra     Disposition Plan:   Home in 3-4 days  Family Communication: No  Family at bedside--Total time spent 35 minutes.  Greater than 50% spent face to face counseling and coordinating care.   Consultants:  nephrology  Code Status:  FULL  DVT Prophylaxis:  SCDs   Procedures: As Listed in Progress Note Above  Antibiotics: Ceftriaxone 9/23>>>    Subjective: Patient denies fevers, chills, headache, chest pain, dyspnea, nausea, vomiting, diarrhea, abdominal pain, dysuria, hematuria, hematochezia, and melena.   Objective: Vitals:   11/09/17 1453 11/09/17 2057 11/09/17 2140 11/10/17 0549  BP: 119/64  121/63 116/62  Pulse: 88  91 88  Resp: 16     Temp: 98.4 F (36.9 C)  98.4 F (36.9 C) 98 F (36.7 C)  TempSrc: Oral  Oral Oral  SpO2: 100% 97% 100% 100%  Weight:      Height:        Intake/Output Summary (Last 24 hours) at 11/10/2017 1154 Last data filed at 11/10/2017 0900 Gross per 24 hour  Intake 1335 ml  Output -  Net 1335 ml   Weight change:  Exam:   General:  Pt is alert, follows commands appropriately, not in acute distress  HEENT: No icterus, No thrush, No neck mass, [place]/AT  Cardiovascular: RRR, S1/S2, no rubs, no gallops  Respiratory: bibasilar crackles, no wheeze  Abdomen: Soft/+BS, non tender, non distended, no guarding  Extremities: No edema, No lymphangitis, No petechiae, No rashes, no synovitis   Data Reviewed: I have personally reviewed following labs and imaging studies Basic Metabolic Panel: Recent Labs  Lab 11/08/17 1900 11/09/17 0438 11/10/17 0427  NA 137 138 135  K 5.3* 5.4* 4.2  CL 113* 114* 102  CO2 12* 9*  19*  GLUCOSE 214* 217* 389*  BUN 81* 79* 80*  CREATININE 11.93* 11.68* 10.78*  CALCIUM 8.0* 7.7* 6.6*  PHOS  --   --  4.9*   Liver Function Tests: Recent Labs  Lab 11/08/17 1900 11/10/17 0427  AST 12*  --   ALT 8  --   ALKPHOS 90  --   BILITOT 0.5  --   PROT 7.8  --     ALBUMIN 2.4* 1.7*   No results for input(s): LIPASE, AMYLASE in the last 168 hours. No results for input(s): AMMONIA in the last 168 hours. Coagulation Profile: No results for input(s): INR, PROTIME in the last 168 hours. CBC: Recent Labs  Lab 11/08/17 1900 11/09/17 0438  WBC 15.8* 15.6*  NEUTROABS 12.5*  --   HGB 10.1* 10.2*  HCT 32.1* 32.5*  MCV 88.9 89.5  PLT 533* 524*   Cardiac Enzymes: No results for input(s): CKTOTAL, CKMB, CKMBINDEX, TROPONINI in the last 168 hours. BNP: Invalid input(s): POCBNP CBG: Recent Labs  Lab 11/09/17 1257 11/09/17 1605 11/09/17 2141 11/10/17 0735 11/10/17 1115  GLUCAP 231* 180* 170* 355* 275*   HbA1C: No results for input(s): HGBA1C in the last 72 hours. Urine analysis:    Component Value Date/Time   COLORURINE YELLOW 11/08/2017 2007   APPEARANCEUR TURBID (A) 11/08/2017 2007   LABSPEC 1.009 11/08/2017 2007   PHURINE 6.0 11/08/2017 2007   GLUCOSEU NEGATIVE 11/08/2017 2007   HGBUR MODERATE (A) 11/08/2017 2007   BILIRUBINUR NEGATIVE 11/08/2017 2007   KETONESUR NEGATIVE 11/08/2017 2007   PROTEINUR 100 (A) 11/08/2017 2007   UROBILINOGEN 0.2 07/25/2014 2309   NITRITE NEGATIVE 11/08/2017 2007   LEUKOCYTESUR MODERATE (A) 11/08/2017 2007   Sepsis Labs: @LABRCNTIP (procalcitonin:4,lacticidven:4) ) Recent Results (from the past 240 hour(s))  Urine culture     Status: Abnormal (Preliminary result)   Collection Time: 11/08/17  8:07 PM  Result Value Ref Range Status   Specimen Description   Final    URINE, RANDOM Performed at [facility], [address], [place], [place] [address]    Special Requests   Final    NONE Performed at [facility], [address], [place], [place] [address]    Culture >=100,000 COLONIES/mL GRAM NEGATIVE RODS (A)  Final   Report Status PENDING  Incomplete     Scheduled Meds: . feeding supplement (GLUCERNA SHAKE)  237 mL Oral BID BM  . insulin aspart  0-9 Units Subcutaneous TID WC  . levETIRAcetam   500 mg Oral BID  . metoprolol succinate  25 mg Oral Daily  . sodium zirconium cyclosilicate  10 g Oral BID   Continuous Infusions: . cefTRIAXone (ROCEPHIN)  IV 1 g (11/09/17 2121)  . furosemide 100 mg (11/10/17 0106)  .  sodium bicarbonate (isotonic) infusion in sterile water 125 mL/hr at 11/10/17 3016    Procedures/Studies: US Renal  Result Date: 11/09/2017 CLINICAL DATA:  62 year old female with chronic kidney disease stage 3. Subsequent encounter. EXAM: RENAL / URINARY TRACT ULTRASOUND COMPLETE COMPARISON:  11/08/2017 CT. FINDINGS: Right Kidney: Length: 10.1 cm. Minimal increased echogenicity. No hydronephrosis or mass. Left Kidney: Length: 10.5 cm. Echogenicity within normal limits. No mass or hydronephrosis visualized. Bladder: Debris within the bladder.  Bilateral ureteral stents in place. IMPRESSION: 1. Bilateral ureteral stents in place.  No hydronephrosis. 2. Debris within the urinary bladder. 3. Slight increased echogenicity right renal parenchyma, may reflect changes of medical renal disease. Electronically Signed   By: Genia Del M.D.   On: 11/09/2017 14:54  Dg Chest Port 1 View  Result Date: 11/09/2017 CLINICAL DATA:  PICC line placement EXAM: PORTABLE CHEST 1 VIEW COMPARISON:  10/06/2017 FINDINGS: Left internal jugular PICC line is been placed with the tip at the cavoatrial junction. No pneumothorax. Heart is normal size. Lungs clear. No effusions or acute bony abnormality. IMPRESSION: Left PICC line tip at the cavoatrial junction.  No pneumothorax. Electronically Signed   By: Rolm Baptise M.D.   On: 11/09/2017 12:58   Ct Renal Stone Study  Result Date: 11/08/2017 CLINICAL DATA:  62 year old female with UTI. History of kidney stones. Displacement of indwelling urethral stent. EXAM: CT ABDOMEN AND PELVIS WITHOUT CONTRAST TECHNIQUE: Multidetector CT imaging of the abdomen and pelvis was performed following the standard protocol without IV contrast. COMPARISON:  CT of the abdomen  pelvis dated 12/07/2012 FINDINGS: Evaluation of this exam is limited in the absence of intravenous contrast. Lower chest: Bibasilar linear atelectasis/scarring. There is calcification of the mitral annulus. There is hypoattenuation of the cardiac blood pool suggestive of a degree of anemia. Clinical correlation is recommended. No intra-abdominal free air. Small ascites and diffuse mesenteric edema. Hepatobiliary: The liver is unremarkable. There is mild intrahepatic biliary ductal dilatation as seen on the prior CT. The gallbladder is not visualized, likely surgically absent. Pancreas: The pancreas is atrophic with coarse calcifications likely sequela of chronic pancreatitis. Spleen: Normal in size without focal abnormality. Adrenals/Urinary Tract: The adrenal glands are unremarkable. Small faint hypodensities within the renal collecting systems on likely represent small nonobstructing calculi versus residual excreted contrast from recent IV administration. Clinical correlation is recommended. Bilateral pigtail ureteral stents noted. The proximal tip of the right ureteral stent is in the right renal upper pole collecting system and the proximal tip of the left ureteral stent is in the left renal pelvis. The distal ends of the stents are within the urinary bladder. There is no hydronephrosis on either side. Stomach/Bowel: There is diffuse thickened appearance of the wall of the stomach as well as thickened appearance of the colon, likely related to ascites. Gastritis or colitis are considered less likely. Clinical correlation is recommended. There is no bowel obstruction. The appendix is normal. Vascular/Lymphatic: The abdominal aorta and IVC are grossly unremarkable on this noncontrast CT. No portal venous gas. Top-normal left retroperitoneal lymph nodes. Reproductive: Vascular calcification of the peripheral uterus noted. No pelvic mass. Other: Diffuse subcutaneous edema and anasarca. Musculoskeletal: There is  degenerative changes of the spine. Grade 1 L4-L5 anterolisthesis. There is compression fracture of the superior endplate of the L2, age indeterminate but new since the prior CT of 2014. Compression fracture of the inferior endplate of G38 and V56. The T12 compression fracture is new since the prior CT. There is no retropulsed fragment. IMPRESSION: 1. Bilateral ureteral stents in place as described. No hydronephrosis. 2. Probable small bilateral renal calculi versus residual contrast from recent study. 3. Small ascites, diffuse edema, and anasarca of indeterminate etiology. Clinical correlation is recommended. 4. Thickened stomach and colon, likely related to ascites. Electronically Signed   By: Anner Crete M.D.   On: 11/08/2017 21:41    Orson Eva, DO  [facility] Pager [phone]  If 7PM-7AM, please contact night-coverage [email or web address] Password [email or web address] 11/10/2017, 11:54 AM   LOS: 2 days

## 2017-11-10 NOTE — Progress Notes (Signed)
Erica [name]  MRN: [id number]  DOB/AGE: 63-Nov-1957 63 y.o.  Primary Care Physician:Butler, Caren Griffins, DO  Admit date: 11/08/2017  Chief Complaint:  Chief Complaint  Patient presents with  . Weakness    S-Pt presented on  11/08/2017 with  Chief Complaint  Patient presents with  . Weakness  .    Pt today feels better  Meds . feeding supplement (GLUCERNA SHAKE)  237 mL Oral BID BM  . insulin aspart  0-9 Units Subcutaneous TID WC  . levETIRAcetam  500 mg Oral BID  . metoprolol succinate  25 mg Oral Daily  . sodium zirconium cyclosilicate  10 g Oral BID    Physical Exam: Vital signs in last 24 hours: Temp:  [98 F (36.7 C)-98.4 F (36.9 C)] 98 F (36.7 C) (09/25 0549) Pulse Rate:  [88-91] 88 (09/25 0549) Resp:  [16] 16 (09/24 1453) BP: (116-121)/(62-64) 116/62 (09/25 0549) SpO2:  [97 %-100 %] 100 % (09/25 0549) Weight change:  Last BM Date: 11/08/17  Intake/Output from previous day: 09/24 0701 - 09/25 0700 In: 1215 [P.O.:840; I.V.:375] Out: -  Total I/O In: 240 [P.O.:240] Out: -    Physical Exam: General- pt is awake,alert, oriented to time place and person Resp- No acute REsp distress, CTA B/L NO Rhonchi CVS- S1S2 regular ij rate and rhythm GIT- BS+, soft, NT, ND EXT- NO LE Edema, Cyanosis   Lab Results: CBC Recent Labs    11/08/17 1900 11/09/17 0438  WBC 15.8* 15.6*  HGB 10.1* 10.2*  HCT 32.1* 32.5*  PLT 533* 524*    BMET Recent Labs    11/09/17 0438 11/10/17 0427  NA 138 135  K 5.4* 4.2  CL 114* 102  CO2 9* 19*  GLUCOSE 217* 389*  BUN 79* 80*  CREATININE 11.68* 10.78*  CALCIUM 7.7* 6.6*   Creat trend 2019  11.6=>10.7       4ish at the time of d/c from [facility]  Lab Results  Component Value Date   CALCIUM 6.6 (L) 11/10/2017   CAION 1.15 10/25/2012   PHOS 4.9 (H) 11/10/2017   Alb 1.7 Corrected calcium 6.6 + 1.8=8.4      Impression: 1)Renal  AKI secondary to Prerenal/ATN                Hx of Multiple AKI  CKD stage ?.( low GFR for less than 3 months)              Patient was admitted August of this year to [facility] in [place] for 12 days.              At that time she was admitted for ATN and emphysematous pyelonephritis.             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 Calcium little low side after correction  for low albumin.  5)ID-admitted with UTI PMD following  6)Electrolytes Hyperkalemic NOrmonatremic   7)Acid base Co2 not at goal On IV Bicarb Now better   Plan:  Will continue current plan.      Frisco Cordts S 11/10/2017, 9:26 AM

## 2017-11-11 DIAGNOSIS — N178 Other acute kidney failure: Secondary | ICD-10-CM

## 2017-11-11 LAB — BASIC METABOLIC PANEL
ANION GAP: 12 (ref 5–15)
BUN: 77 mg/dL — ABNORMAL HIGH (ref 8–23)
CALCIUM: 5.7 mg/dL — AB (ref 8.9–10.3)
CO2: 31 mmol/L (ref 22–32)
Chloride: 90 mmol/L — ABNORMAL LOW (ref 98–111)
Creatinine, Ser: 9.67 mg/dL — ABNORMAL HIGH (ref 0.44–1.00)
GFR calc Af Amer: 4 mL/min — ABNORMAL LOW (ref >60)
GFR calc non Af Amer: 4 mL/min — ABNORMAL LOW (ref >60)
GLUCOSE: 142 mg/dL — AB (ref 70–99)
Potassium: 3.1 mmol/L — ABNORMAL LOW (ref 3.5–5.1)
Sodium: 133 mmol/L — ABNORMAL LOW (ref 135–145)

## 2017-11-11 LAB — GLUCOSE, CAPILLARY
GLUCOSE-CAPILLARY: 60 mg/dL — AB (ref 70–99)
GLUCOSE-CAPILLARY: 74 mg/dL (ref 70–99)
Glucose-Capillary: 57 mg/dL — ABNORMAL LOW (ref 70–99)
Glucose-Capillary: 82 mg/dL (ref 70–99)
Glucose-Capillary: 176 mg/dL — ABNORMAL HIGH (ref 70–99)
Glucose-Capillary: 136 mg/dL — ABNORMAL HIGH (ref 70–99)

## 2017-11-11 LAB — URINE CULTURE: Culture: >=100000 — AB

## 2017-11-11 MED ORDER — INSULIN GLARGINE 100 UNIT/ML ~~LOC~~ SOLN
5.0000 [IU] | Freq: Every day | SUBCUTANEOUS | Status: DC
  Administered 2017-11-12: 5 [IU] via SUBCUTANEOUS
  Filled 2017-11-11 (×4): qty 0.05

## 2017-11-11 MED ORDER — SODIUM CHLORIDE 0.9 % IV SOLN
INTRAVENOUS | Status: DC | PRN
  Administered 2017-11-11: 250 mL via INTRAVENOUS
  Administered 2017-11-21: 500 mL via INTRAVENOUS

## 2017-11-11 NOTE — Progress Notes (Signed)
PROGRESS NOTE  Erica [name] DJM:[id number] DOB: 04-26-1955 DOA: 11/08/2017 PCP: Octavio Graves, DO  Brief History:  62 year old female with a history of diabetes mellitus, hypertension, seizure disorder presenting with generalized weakness x2 to 3 days with confusion.  The patient was also complaining of lower abd pain with dysuria.    The patient was recently hospitalized at [facility] from 10/07/2017 through 10/21/2017 during which she was treated for emphysematous pyelonephritis and acute on chronic renal failure.  She also was treated for DKA and fluid overload.  She was noted to have hemoglobin A1c of 12.1.  She required transfusion 2 u PRBC without evidence of active bleeding.  She underwent bilateral ureteral stenting on 10/07/2017.  She was treated appropriately with antibiotics.  She was discharged with instructions for outpatient cystoscopy and removal of stents in 2 to 3 weeks.  Upon admission, the patient has serum creatinine of 8.1.  At the time of discharge, her serum creatinine has stabilized between 3.7-3.9.  Nephrology was consulted to assist with management.  Assessment/Plan: Acute on chronic renal failure--stage unspecified presently -likely volume depletion/hemodynamic changes -Appreciate nephrology consult -Continue Lasix drip per nephrology -Continue bicarbonate drip per nephrology  Diabetes mellitus type 2, uncontrolled with hyperglycemia -Decrease Lantus 5 units daily due to hypoglycemia -NovoLog sliding scale  -10/07/17 A1C = 12.1  Essential hypertension -Continue metoprolol succinate -Holding amlodipine secondary to soft blood pressure  Metabolic acidosis -Secondary to acute on chronic renal failure -Continue bicarbonate drip per nephrology  Pyelonephritis -11/08/17 urine culture = Ecoli -continue ceftriaxone -11/08/2017 CT renal protocol--bilateral ureteral stents without hydronephrosis.  Small bilateral renal calculi.  Small amount  of  ascites with anasarca.  Thickened stomach may be related to ascites. -ureteral stents initially placed 10/07/17 -urology consult-->?need for stent removal  Seizure disorder -continue keppra     Disposition Plan:   Home in 3-4 days  Family Communication: Son updated on phone 9/26    Consultants:  nephrology, urology  Code Status:  FULL  DVT Prophylaxis:  SCDs   Procedures: As Listed in Progress Note Above  Antibiotics: Ceftriaxone 9/23>>>   Subjective: Patient denies fevers, chills, headache, chest pain, dyspnea, nausea, vomiting, diarrhea, abdominal pain, dysuria, hematuria, hematochezia, and melena.   Objective: Vitals:   11/10/17 2150 11/11/17 0509 11/11/17 1117 11/11/17 1327  BP: 125/74 122/65 126/79 (!) 144/82  Pulse: 90 87 86 90  Resp: 17 15  16   Temp: 99.4 F (37.4 C) 98.4 F (36.9 C)  97.7 F (36.5 C)  TempSrc: Oral Oral  Oral  SpO2: 100% 97%  100%  Weight:      Height:        Intake/Output Summary (Last 24 hours) at 11/11/2017 1643 Last data filed at 11/11/2017 1604 Gross per 24 hour  Intake 480 ml  Output 1050 ml  Net -570 ml   Weight change:  Exam:   General:  Pt is alert, follows commands appropriately, not in acute distress  HEENT: No icterus, No thrush, No neck mass, [place]/AT  Cardiovascular: RRR, S1/S2, no rubs, no gallops  Respiratory: CTA bilaterally, no wheezing, no crackles, no rhonchi  Abdomen: Soft/+BS, non tender, non distended, no guarding  Extremities: No edema, No lymphangitis, No petechiae, No rashes, no synovitis   Data Reviewed: I have personally reviewed following labs and imaging studies Basic Metabolic Panel: Recent Labs  Lab 11/08/17 1900 11/09/17 0438 11/10/17 0427 11/11/17 1100  NA 137 138 135 133*  K  5.3* 5.4* 4.2 3.1*  CL 113* 114* 102 90*  CO2 12* 9* 19* 31  GLUCOSE 214* 217* 389* 142*  BUN 81* 79* 80* 77*  CREATININE 11.93* 11.68* 10.78* 9.67*  CALCIUM 8.0* 7.7* 6.6* 5.7*  PHOS   --   --  4.9*  --    Liver Function Tests: Recent Labs  Lab 11/08/17 1900 11/10/17 0427  AST 12*  --   ALT 8  --   ALKPHOS 90  --   BILITOT 0.5  --   PROT 7.8  --   ALBUMIN 2.4* 1.7*   No results for input(s): LIPASE, AMYLASE in the last 168 hours. No results for input(s): AMMONIA in the last 168 hours. Coagulation Profile: No results for input(s): INR, PROTIME in the last 168 hours. CBC: Recent Labs  Lab 11/08/17 1900 11/09/17 0438  WBC 15.8* 15.6*  NEUTROABS 12.5*  --   HGB 10.1* 10.2*  HCT 32.1* 32.5*  MCV 88.9 89.5  PLT 533* 524*   Cardiac Enzymes: No results for input(s): CKTOTAL, CKMB, CKMBINDEX, TROPONINI in the last 168 hours. BNP: Invalid input(s): POCBNP CBG: Recent Labs  Lab 11/11/17 0743 11/11/17 0805 11/11/17 0851 11/11/17 1122 11/11/17 1604  GLUCAP 57* 60* 82 176* 136*   HbA1C: No results for input(s): HGBA1C in the last 72 hours. Urine analysis:    Component Value Date/Time   COLORURINE YELLOW 11/08/2017 2007   APPEARANCEUR TURBID (A) 11/08/2017 2007   LABSPEC 1.009 11/08/2017 2007   PHURINE 6.0 11/08/2017 2007   GLUCOSEU NEGATIVE 11/08/2017 2007   HGBUR MODERATE (A) 11/08/2017 2007   BILIRUBINUR NEGATIVE 11/08/2017 2007   KETONESUR NEGATIVE 11/08/2017 2007   PROTEINUR 100 (A) 11/08/2017 2007   UROBILINOGEN 0.2 07/25/2014 2309   NITRITE NEGATIVE 11/08/2017 2007   LEUKOCYTESUR MODERATE (A) 11/08/2017 2007   Sepsis Labs: @LABRCNTIP (procalcitonin:4,lacticidven:4) ) Recent Results (from the past 240 hour(s))  Urine culture     Status: Abnormal   Collection Time: 11/08/17  8:07 PM  Result Value Ref Range Status   Specimen Description   Final    URINE, RANDOM Performed at [facility], [address] [place], [place] [address]    Special Requests   Final    NONE Performed at [facility], [address], [place], [place] [address]    Culture >=100,000 COLONIES/mL ESCHERICHIA COLI (A)  Final   Report Status 11/11/2017 FINAL  Final     Organism ID, Bacteria ESCHERICHIA COLI (A)  Final      Susceptibility   Escherichia coli - MIC*    AMPICILLIN >=32 RESISTANT Resistant     CEFAZOLIN <=4 SENSITIVE Sensitive     CEFTRIAXONE <=1 SENSITIVE Sensitive     CIPROFLOXACIN <=0.25 SENSITIVE Sensitive     GENTAMICIN <=1 SENSITIVE Sensitive     IMIPENEM <=0.25 SENSITIVE Sensitive     NITROFURANTOIN <=16 SENSITIVE Sensitive     TRIMETH/SULFA <=20 SENSITIVE Sensitive     AMPICILLIN/SULBACTAM 16 INTERMEDIATE Intermediate     PIP/TAZO <=4 SENSITIVE Sensitive     Extended ESBL NEGATIVE Sensitive     * >=100,000 COLONIES/mL ESCHERICHIA COLI     Scheduled Meds: . feeding supplement (GLUCERNA SHAKE)  237 mL Oral BID BM  . insulin aspart  0-9 Units Subcutaneous TID WC  . [START ON 11/12/2017] insulin glargine  5 Units Subcutaneous Daily  . levETIRAcetam  500 mg Oral BID  . metoprolol succinate  25 mg Oral Daily   Continuous Infusions: . sodium chloride 250 mL (11/11/17 1542)  .  cefTRIAXone (ROCEPHIN)  IV 1 g (11/10/17 2053)  . furosemide 100 mg (11/11/17 1543)  .  sodium bicarbonate (isotonic) infusion in sterile water 125 mL/hr at 11/11/17 1116    Procedures/Studies: US Renal  Result Date: 11/09/2017 CLINICAL DATA:  62 year old female with chronic kidney disease stage 3. Subsequent encounter. EXAM: RENAL / URINARY TRACT ULTRASOUND COMPLETE COMPARISON:  11/08/2017 CT. FINDINGS: Right Kidney: Length: 10.1 cm. Minimal increased echogenicity. No hydronephrosis or mass. Left Kidney: Length: 10.5 cm. Echogenicity within normal limits. No mass or hydronephrosis visualized. Bladder: Debris within the bladder.  Bilateral ureteral stents in place. IMPRESSION: 1. Bilateral ureteral stents in place.  No hydronephrosis. 2. Debris within the urinary bladder. 3. Slight increased echogenicity right renal parenchyma, may reflect changes of medical renal disease. Electronically Signed   By: Genia Del M.D.   On: 11/09/2017 14:54   Dg Chest Port 1  View  Result Date: 11/09/2017 CLINICAL DATA:  PICC line placement EXAM: PORTABLE CHEST 1 VIEW COMPARISON:  10/06/2017 FINDINGS: Left internal jugular PICC line is been placed with the tip at the cavoatrial junction. No pneumothorax. Heart is normal size. Lungs clear. No effusions or acute bony abnormality. IMPRESSION: Left PICC line tip at the cavoatrial junction.  No pneumothorax. Electronically Signed   By: Rolm Baptise M.D.   On: 11/09/2017 12:58   Ct Renal Stone Study  Result Date: 11/08/2017 CLINICAL DATA:  62 year old female with UTI. History of kidney stones. Displacement of indwelling urethral stent. EXAM: CT ABDOMEN AND PELVIS WITHOUT CONTRAST TECHNIQUE: Multidetector CT imaging of the abdomen and pelvis was performed following the standard protocol without IV contrast. COMPARISON:  CT of the abdomen pelvis dated 12/07/2012 FINDINGS: Evaluation of this exam is limited in the absence of intravenous contrast. Lower chest: Bibasilar linear atelectasis/scarring. There is calcification of the mitral annulus. There is hypoattenuation of the cardiac blood pool suggestive of a degree of anemia. Clinical correlation is recommended. No intra-abdominal free air. Small ascites and diffuse mesenteric edema. Hepatobiliary: The liver is unremarkable. There is mild intrahepatic biliary ductal dilatation as seen on the prior CT. The gallbladder is not visualized, likely surgically absent. Pancreas: The pancreas is atrophic with coarse calcifications likely sequela of chronic pancreatitis. Spleen: Normal in size without focal abnormality. Adrenals/Urinary Tract: The adrenal glands are unremarkable. Small faint hypodensities within the renal collecting systems on likely represent small nonobstructing calculi versus residual excreted contrast from recent IV administration. Clinical correlation is recommended. Bilateral pigtail ureteral stents noted. The proximal tip of the right ureteral stent is in the right renal upper  pole collecting system and the proximal tip of the left ureteral stent is in the left renal pelvis. The distal ends of the stents are within the urinary bladder. There is no hydronephrosis on either side. Stomach/Bowel: There is diffuse thickened appearance of the wall of the stomach as well as thickened appearance of the colon, likely related to ascites. Gastritis or colitis are considered less likely. Clinical correlation is recommended. There is no bowel obstruction. The appendix is normal. Vascular/Lymphatic: The abdominal aorta and IVC are grossly unremarkable on this noncontrast CT. No portal venous gas. Top-normal left retroperitoneal lymph nodes. Reproductive: Vascular calcification of the peripheral uterus noted. No pelvic mass. Other: Diffuse subcutaneous edema and anasarca. Musculoskeletal: There is degenerative changes of the spine. Grade 1 L4-L5 anterolisthesis. There is compression fracture of the superior endplate of the L2, age indeterminate but new since the prior CT of 2014. Compression fracture of the inferior endplate of L38  and T10. The T12 compression fracture is new since the prior CT. There is no retropulsed fragment. IMPRESSION: 1. Bilateral ureteral stents in place as described. No hydronephrosis. 2. Probable small bilateral renal calculi versus residual contrast from recent study. 3. Small ascites, diffuse edema, and anasarca of indeterminate etiology. Clinical correlation is recommended. 4. Thickened stomach and colon, likely related to ascites. Electronically Signed   By: Anner Crete M.D.   On: 11/08/2017 21:41    Orson Eva, DO  [facility] Pager [phone]  If 7PM-7AM, please contact night-coverage [email or web address] Password [facility] 11/11/2017, 4:43 PM   LOS: 3 days

## 2017-11-11 NOTE — Progress Notes (Signed)
Unable to collect urine sample. Two attempts have been made today but both samples were contaminated with stool.

## 2017-11-11 NOTE — Clinical Social Work Note (Signed)
CSW consult for new HD arrangements received. Per MD, at this time, pt is being medically managed without dialysis being initiated. Will clear this consult. Please re-consult if plan changes and pt will require dialysis.

## 2017-11-11 NOTE — Progress Notes (Signed)
Subjective: Interval History: Patient appetite is improving.  She does not have any nausea or vomiting.  No diarrhea.  At this moment denies also any difficulty breathing..  Objective: Vital signs in last 24 hours: Temp:  [98.4 F (36.9 C)-99.4 F (37.4 C)] 98.4 F (36.9 C) (09/26 0509) Pulse Rate:  [87-90] 87 (09/26 0509) Resp:  [15-17] 15 (09/26 0509) BP: (122-126)/(65-74) 122/65 (09/26 0509) SpO2:  [95 %-100 %] 97 % (09/26 0509) Weight change:   Intake/Output from previous day: 09/25 0701 - 09/26 0700 In: 600 [P.O.:600] Out: 750 [Urine:750] Intake/Output this shift: No intake/output data recorded.  General appearance: alert, cooperative and no distress Resp: clear to auscultation bilaterally Cardio: regular rate and rhythm Extremities: No edema  Lab Results: Recent Labs    11/08/17 1900 11/09/17 0438  WBC 15.8* 15.6*  HGB 10.1* 10.2*  HCT 32.1* 32.5*  PLT 533* 524*   BMET:  Recent Labs    11/09/17 0438 11/10/17 0427  NA 138 135  K 5.4* 4.2  CL 114* 102  CO2 9* 19*  GLUCOSE 217* 389*  BUN 79* 80*  CREATININE 11.68* 10.78*  CALCIUM 7.7* 6.6*   No results for input(s): PTH in the last 72 hours. Iron Studies: No results for input(s): IRON, TIBC, TRANSFERRIN, FERRITIN in the last 72 hours.  Studies/Results: US Renal  Result Date: 11/09/2017 CLINICAL DATA:  62 year old female with chronic kidney disease stage 3. Subsequent encounter. EXAM: RENAL / URINARY TRACT ULTRASOUND COMPLETE COMPARISON:  11/08/2017 CT. FINDINGS: Right Kidney: Length: 10.1 cm. Minimal increased echogenicity. No hydronephrosis or mass. Left Kidney: Length: 10.5 cm. Echogenicity within normal limits. No mass or hydronephrosis visualized. Bladder: Debris within the bladder.  Bilateral ureteral stents in place. IMPRESSION: 1. Bilateral ureteral stents in place.  No hydronephrosis. 2. Debris within the urinary bladder. 3. Slight increased echogenicity right renal parenchyma, may reflect changes  of medical renal disease. Electronically Signed   By: Genia Del M.D.   On: 11/09/2017 14:54   Dg Chest Port 1 View  Result Date: 11/09/2017 CLINICAL DATA:  PICC line placement EXAM: PORTABLE CHEST 1 VIEW COMPARISON:  10/06/2017 FINDINGS: Left internal jugular PICC line is been placed with the tip at the cavoatrial junction. No pneumothorax. Heart is normal size. Lungs clear. No effusions or acute bony abnormality. IMPRESSION: Left PICC line tip at the cavoatrial junction.  No pneumothorax. Electronically Signed   By: Rolm Baptise M.D.   On: 11/09/2017 12:58    I have reviewed the patient's current medications.  Assessment/Plan: 1] acute possibly over chronic renal failure.  Presently patient is nonoliguric.  Her renal function seems to be improving.  Still her creatinine is high.  Presently patient is eating her breakfast and she states that her appetite is better.  She did not have any other signs and symptoms of uremia.  Patient had 750 cc of urine output. 2] anemia: Her hemoglobin remains within our target goal. 3] hyperkalemia: Her potassium is normal. 4] bone and mineral disorder: Calcium and phosphorus is in range  5] metabolic acidosis: Patient is on sodium bicarb.  His CO2 is 19 and improving. 6] hypertension: Her blood pressure is reasonably controlled 7] diabetes Plan:1] We will continue with hydration and Lasix. 2] we will DC [place] 3] we will check a renal panel and CBC in the morning.   LOS: 3 days   Mylene Bow S 11/11/2017,8:26 AM

## 2017-11-11 NOTE — Progress Notes (Signed)
Pt CBG now 82. Will continue to monitor patient.

## 2017-11-11 NOTE — Progress Notes (Signed)
Pt CBG 60. Pt asymptomatic at this time. Pt offered 4 ozs regular juice and her breakfast. Will recheck CBG upon completion of breakfast.

## 2017-11-11 NOTE — Progress Notes (Addendum)
Inpatient Diabetes Program Recommendations  AACE/ADA: New Consensus Statement on Inpatient Glycemic Control (2015)  Target Ranges:  Prepandial:   less than 140 mg/dL      Peak postprandial:   less than 180 mg/dL (1-2 hours)      Critically ill patients:  140 - 180 mg/dL   Results for Erica [name], Erica [name] (MRN [id number]) as of 11/11/2017 08:21  Ref. Range 11/10/2017 07:35 11/10/2017 11:15 11/10/2017 15:50 11/10/2017 21:50  Glucose-Capillary Latest Ref Range: 70 - 99 mg/dL 355 (H)  9 units NOVOLOG  275 (H)  5 units NOVOLOG  160 (H)  2 units NOVOLOG +  10 units LANTUS 87   Results for Erica [name], Erica [name] (MRN [id number]) as of 11/11/2017 08:21  Ref. Range 11/11/2017 07:43 11/11/2017 08:05  Glucose-Capillary Latest Ref Range: 70 - 99 mg/dL 57 (L) 60 (L)    Home DM Meds: Novolog 4-10 units TID                             Tresiba (insulin degludec) 10 units QAM  Current Orders: Novolog Sensitive Correction Scale/ SSI (0-9 units) TID AC       Lantus 10 units Daily     CBG yesterday AM was 355 mg/dl followed by CBG at 12pm yesterday of 275 mg/dl.  Note Lantus 10 units (home dose) started yesterday at 4:30pm.  Now with Hypoglycemia this AM (CBG 57 mg/dl).      MD- Please consider reducing Lantus to 5 units Daily (50% total home dose)     --Will follow patient during hospitalization--  Wyn Quaker RN, MSN, CDE Diabetes Coordinator Inpatient Glycemic Control Team Team Pager: [phone] (8a-5p)

## 2017-11-12 ENCOUNTER — Encounter (HOSPITAL_COMMUNITY): Payer: Self-pay | Admitting: Urology

## 2017-11-12 DIAGNOSIS — N10 Acute tubulo-interstitial nephritis: Secondary | ICD-10-CM

## 2017-11-12 LAB — GLUCOSE, CAPILLARY
GLUCOSE-CAPILLARY: 185 mg/dL — AB (ref 70–99)
GLUCOSE-CAPILLARY: 27 mg/dL — AB (ref 70–99)
GLUCOSE-CAPILLARY: 140 mg/dL — AB (ref 70–99)
GLUCOSE-CAPILLARY: 88 mg/dL (ref 70–99)
Glucose-Capillary: 125 mg/dL — ABNORMAL HIGH (ref 70–99)
Glucose-Capillary: 31 mg/dL — CL (ref 70–99)
Glucose-Capillary: 68 mg/dL — ABNORMAL LOW (ref 70–99)
Glucose-Capillary: 137 mg/dL — ABNORMAL HIGH (ref 70–99)
Glucose-Capillary: 44 mg/dL — CL (ref 70–99)

## 2017-11-12 LAB — CBC
HCT: 26 % — ABNORMAL LOW (ref 36.0–46.0)
Hemoglobin: 8.4 g/dL — ABNORMAL LOW (ref 12.0–15.0)
MCH: 27.5 pg (ref 26.0–34.0)
MCHC: 32.3 g/dL (ref 30.0–36.0)
MCV: 85 fL (ref 78.0–100.0)
PLATELETS: 388 10*3/uL (ref 150–400)
RBC: 3.06 MIL/uL — AB (ref 3.87–5.11)
RDW: 15.8 % — AB (ref 11.5–15.5)
WBC: 12.1 10*3/uL — ABNORMAL HIGH (ref 4.0–10.5)

## 2017-11-12 LAB — RENAL FUNCTION PANEL
Albumin: 1.8 g/dL — ABNORMAL LOW (ref 3.5–5.0)
Anion gap: 13 (ref 5–15)
BUN: 68 mg/dL — AB (ref 8–23)
CO2: 36 mmol/L — AB (ref 22–32)
CREATININE: 9.3 mg/dL — AB (ref 0.44–1.00)
Calcium: 5.5 mg/dL — CL (ref 8.9–10.3)
Chloride: 83 mmol/L — ABNORMAL LOW (ref 98–111)
GFR calc Af Amer: 5 mL/min — ABNORMAL LOW (ref >60)
GFR calc non Af Amer: 4 mL/min — ABNORMAL LOW (ref >60)
GLUCOSE: 202 mg/dL — AB (ref 70–99)
Phosphorus: 4.6 mg/dL (ref 2.5–4.6)
Potassium: 3.2 mmol/L — ABNORMAL LOW (ref 3.5–5.1)
SODIUM: 132 mmol/L — AB (ref 135–145)

## 2017-11-12 MED ORDER — DEXTROSE-NACL 5-0.9 % IV SOLN
INTRAVENOUS | Status: DC
  Administered 2017-11-12 – 2017-11-13 (×3): via INTRAVENOUS

## 2017-11-12 MED ORDER — CHLORHEXIDINE GLUCONATE CLOTH 2 % EX PADS
6.0000 | MEDICATED_PAD | Freq: Every day | CUTANEOUS | Status: DC
  Administered 2017-11-12 – 2017-11-16 (×4): 6 via TOPICAL

## 2017-11-12 MED ORDER — SODIUM CHLORIDE 0.9 % IV SOLN
2.0000 g | Freq: Once | INTRAVENOUS | Status: DC
  Filled 2017-11-12: qty 20

## 2017-11-12 MED ORDER — DEXTROSE 50 % IV SOLN
INTRAVENOUS | Status: AC
  Filled 2017-11-12: qty 50

## 2017-11-12 MED ORDER — SODIUM CHLORIDE 0.9 % IV SOLN: 100.0000 mL | INTRAVENOUS | Status: DC | PRN

## 2017-11-12 MED ORDER — DEXTROSE 50 % IV SOLN
INTRAVENOUS | Status: AC
  Administered 2017-11-12: 50 mL
  Filled 2017-11-12: qty 50

## 2017-11-12 MED ORDER — DEXTROSE 50 % IV SOLN
50.0000 mL | Freq: Once | INTRAVENOUS | Status: AC
  Administered 2017-11-12: 50 mL via INTRAVENOUS

## 2017-11-12 MED ORDER — HEPARIN SODIUM (PORCINE) 1000 UNIT/ML DIALYSIS
1000.0000 [IU] | INTRAMUSCULAR | Status: DC | PRN
  Filled 2017-11-12: qty 1

## 2017-11-12 MED ORDER — ALTEPLASE 2 MG IJ SOLR
2.0000 mg | Freq: Once | INTRAMUSCULAR | Status: AC | PRN
  Administered 2017-11-13: 3.1 mg
  Filled 2017-11-12: qty 2

## 2017-11-12 MED ORDER — TUBERCULIN PPD 5 UNIT/0.1ML ID SOLN
5.0000 [IU] | Freq: Once | INTRADERMAL | Status: AC
  Administered 2017-11-12: 5 [IU] via INTRADERMAL
  Filled 2017-11-12: qty 0.1

## 2017-11-12 MED ORDER — CALCIUM CARBONATE 1250 (500 CA) MG PO TABS
1000.0000 mg | ORAL_TABLET | Freq: Three times a day (TID) | ORAL | Status: DC
  Administered 2017-11-12: 1000 mg via ORAL
  Filled 2017-11-12 (×2): qty 1

## 2017-11-12 NOTE — Progress Notes (Signed)
Hypoglycemic Event  CBG: 44  Treatment: 25 gr of D50%  Symptoms: none  Follow-up CBG: 137   Time: 1828  Possible Reasons for Event: Pt not eating  Comments/MD notified:Tat    Noel Henandez N Ares Cardozo

## 2017-11-12 NOTE — Progress Notes (Signed)
Pt lethargic and hard to arouse this am. CBG checked it was 27. Amp of D50 pushed. Rechecked after 15 minutes and CBG now 31. Another amp of D50 pushed. Will recheck CBG....

## 2017-11-12 NOTE — Progress Notes (Signed)
Subjective: Interval History: Patient states that her appetite is not that good.  She has some nausea but no vomiting.  Complains of feeling weak.  Objective: Vital signs in last 24 hours: Temp:  [97.6 F (36.4 C)-97.7 F (36.5 C)] 97.6 F (36.4 C) (09/26 2131) Pulse Rate:  [77-90] 77 (09/27 0655) Resp:  [16] 16 (09/26 1327) BP: (121-144)/(69-82) 121/69 (09/27 0655) SpO2:  [92 %-100 %] 98 % (09/27 0655) Weight change:   Intake/Output from previous day: 09/26 0701 - 09/27 0700 In: 992 [P.O.:480; I.V.:12; IV Piggyback:500] Out: 700 [Urine:700] Intake/Output this shift: No intake/output data recorded.  Generally patient is alert and in no apparent distress Chest: Clear to auscultation no wheezing Heart exam revealed regular rate and rhythm, no murmur Extremities no edema  Lab Results: Recent Labs    11/12/17 0535  WBC 12.1*  HGB 8.4*  HCT 26.0*  PLT 388   BMET:  Recent Labs    11/11/17 1100 11/12/17 0808  NA 133* 132*  K 3.1* 3.2*  CL 90* 83*  CO2 31 36*  GLUCOSE 142* 202*  BUN 77* 68*  CREATININE 9.67* 9.30*  CALCIUM 5.7* 5.5*   No results for input(s): PTH in the last 72 hours. Iron Studies: No results for input(s): IRON, TIBC, TRANSFERRIN, FERRITIN in the last 72 hours.  Studies/Results: No results found.  I have reviewed the patient's current medications.  Assessment/Plan: 1] renal failure: Possibly acute on chronic.  Presently her creatinine remains high.  Renal function is slightly improving but does not seem to show significant improvement.  Patient is nonoliguric.  Still her appetite is poor even though she is eating.  She has some nausea.  Patient has only 700 cc of urine output. 2] anemia: Her hemoglobin remains within our target goal. 3] hyperkalemia: Her potassium is low. 4] bone and mineral disorder: Calcium is low even corrected for low Albumin but her  phosphorus is in range  5] metabolic acidosis: Patient is on sodium bicarb.  His CO2 is  high today. 6] hypertension: Her blood pressure is reasonably controlled 7] diabetes Plan:1] We will change IV fluid to normal saline at 125 cc/h 2] since her renal function is now progressively improving as expected I discussed with her about initiating hemodialysis since patient has poor appetite and some nausea.  Patient has agreed 4] we will ask surgery to put a femoral catheter for dialysis and will initiate dialysis today. 5] once patient is stable we will send her for tunnel catheter placement if she requires dialysis continuously. 6] we will start her on calcium carbonate 650 mg 2 tablet p.o. 3 times daily 7] we will check a renal panel and CBC in the morning.   LOS: 4 days   Betta Balla S 11/12/2017,9:00 AM

## 2017-11-12 NOTE — Progress Notes (Signed)
Pt has had multiple loose/watery stools throughout the day. Night coverage made aware. Awaiting orders

## 2017-11-12 NOTE — Procedures (Signed)
INITIAL HEMODIALYSIS TREATMENT NOTE:  First ever HD completed today via newly placed right femoral catheter.  Exit site unremarkable.  When I arrived to dialyze her, pt was lying prone and had been incontinent.  Dressing was changed, as it had become soiled with watery green stool.  Pt was alert only to self and was restless/uncooperative.  Telephone consent had been obtained from pt's son.  0.5 L goal was met.  SBP 100-120.  All blood was returned.  Report given to Jeri Modena, RN.  Rockwell Alexandria, RN.

## 2017-11-12 NOTE — Progress Notes (Addendum)
Inpatient Diabetes Program Recommendations  AACE/ADA: New Consensus Statement on Inpatient Glycemic Control (2015)  Target Ranges:  Prepandial:   less than 140 mg/dL      Peak postprandial:   less than 180 mg/dL (1-2 hours)      Critically ill patients:  140 - 180 mg/dL   Results for Erica [name], Erica [name] (MRN [id number]) as of 11/12/2017 09:12  Ref. Range 11/11/2017 07:43 11/11/2017 08:05 11/11/2017 08:51 11/11/2017 11:22 11/11/2017 16:04 11/11/2017 21:32  Glucose-Capillary Latest Ref Range: 70 - 99 mg/dL 57 (L) 60 (L) 82 176 (H)  2 units NOVOLOG +  10 units LANTUS  136 (H)  1 unit NOVOLOG  74   Results for Erica [name], Erica [name] (MRN [id number]) as of 11/12/2017 09:12  Ref. Range 11/12/2017 06:32 11/12/2017 06:50  Glucose-Capillary Latest Ref Range: 70 - 99 mg/dL 27 (LL) 31 (LL)    Home DM Meds:Novolog 4-10 units TID Tresiba (insulin degludec)10 units QAM  Current Orders:Novolog Sensitive Correction Scale/ SSI (0-9 units) TID AC                            Lantus 5 units Daily      Note Lantus 10 units (home dose) started 09/25 at 4:30pm.  Hypoglycemia yesterday AM (CBG 57 mg/dl).  Hypoglycemia again this AM.    MD- Note that Lantus dose was reduced to 5 units Daily yesterday, however, patient still received 10 units Lantus yesterday at 1225 pm (dose was reduced to 5 units yesterday at 3pm after 10 units was given by RN).  Patient will only get 5 units Lantus today, so hopefully this reduction will help prevent further Hypoglycemia.    --Will follow patient during hospitalization--  Wyn Quaker RN, MSN, CDE Diabetes Coordinator Inpatient Glycemic Control Team Team Pager: [phone] (8a-5p)

## 2017-11-12 NOTE — Progress Notes (Signed)
PROGRESS NOTE  Erica [name] OVF:[id number] DOB: April 09, 1955 DOA: 11/08/2017 PCP: Octavio Graves, DO  Brief History: 62 year old female with a history of diabetes mellitus, hypertension, seizure disorder presenting with generalized weakness x2 to 3 dayswith confusion.The patient was also complaining of lower abd pain with dysuria.The patient was recently hospitalized at [facility] from 10/07/2017 through 10/21/2017 during which she was treated for emphysematous pyelonephritis and acute on chronic renal failure. She also was treated for DKA and fluid overload.She was noted to have hemoglobin A1c of 12.1. She required transfusion 2 u PRBC without evidence of active bleeding.She underwent bilateral ureteral stenting on 10/07/2017. She was treated appropriately with antibiotics. She was discharged with instructions for outpatient cystoscopy and removal of stents in 2 to 3 weeks. Upon this admission, the patient has serum creatinine of 8.1. At the time of discharge, her serum creatinine has stabilized between 3.7-3.9.Nephrology was consulted to assist with management.  Urology was also consulted due to retained stents  Assessment/Plan: Acute on chronic renal failure--stage unspecified presently -likely volume depletion/hemodynamic changes -Appreciate nephrology consult -Continue Lasix drip per nephrology -Continue bicarbonate drip per nephrology -11/12/17--case discussed with renal--Dr. Festus Aloe temporary HD catheter to initiate HD -ultimately will need Perm-Cath--defer timing to nephrology  Pyelonephritis -11/08/17 urine culture = Ecoli -continue ceftriaxone -11/08/2017 CT renal protocol--bilateral ureteral stents without hydronephrosis. Small bilateral renal calculi. Small amount of  ascites with anasarca. Thickened stomach may be related to ascites. -ureteral stents initially placed 10/07/17 -11/12/17--case discussed with urology, Dr.  Macario Golds plan for stent removal 11/16/17 if pt still in hospital.  If not, can be done outpt  Diabetes mellitus type 2, uncontrolled with hyperglycemia -Decrease Lantus5 units daily due to hypoglycemia -NovoLog sliding scale -10/07/17 A1C = 12.1  Essential hypertension -Continue metoprolol succinate -Holding amlodipine secondary to soft blood pressure  Metabolic acidosis -Secondary to acute on chronic renal failure -Continue bicarbonate drip per nephrology  Seizure disorder -continue keppra     Disposition Plan: Home in 3-4days  Family Communication:Son updated on phone 9/27   Consultants:nephrology, urology  Code Status: FULL  DVT Prophylaxis: SCDs   Procedures: As Listed in Progress Note Above  Antibiotics: Ceftriaxone 9/23>>>    Subjective: Patient denies fevers, chills, headache, chest pain, dyspnea, nausea, vomiting, diarrhea, abdominal pain, dysuria, hematuria, hematochezia, and melena.   Objective: Vitals:   11/11/17 2053 11/11/17 2131 11/12/17 0655 11/12/17 1518  BP:  136/77 121/69 122/63  Pulse:  80 77 74  Resp:    18  Temp:  97.6 F (36.4 C)  (!) 97.5 F (36.4 C)  TempSrc:  Oral  Oral  SpO2: 92% 100% 98%   Weight:      Height:        Intake/Output Summary (Last 24 hours) at 11/12/2017 1715 Last data filed at 11/12/2017 1624 Gross per 24 hour  Intake 1418.91 ml  Output -  Net 1418.91 ml   Weight change:  Exam:   General:  Pt is alert, follows commands appropriately, not in acute distress  HEENT: No icterus, No thrush, No neck mass, [place]/AT  Cardiovascular: RRR, S1/S2, no rubs, no gallops  Respiratory: fine bibasilar crackles, no wheeze  Abdomen: Soft/+BS, non tender, non distended, no guarding  Extremities: No edema, No lymphangitis, No petechiae, No rashes, no synovitis   Data Reviewed: I have personally reviewed following labs and imaging studies Basic Metabolic Panel: Recent Labs  Lab  11/08/17 1900 11/09/17 0438 11/10/17 0427 11/11/17 1100 11/12/17  0808  NA 137 138 135 133* 132*  K 5.3* 5.4* 4.2 3.1* 3.2*  CL 113* 114* 102 90* 83*  CO2 12* 9* 19* 31 36*  GLUCOSE 214* 217* 389* 142* 202*  BUN 81* 79* 80* 77* 68*  CREATININE 11.93* 11.68* 10.78* 9.67* 9.30*  CALCIUM 8.0* 7.7* 6.6* 5.7* 5.5*  PHOS  --   --  4.9*  --  4.6   Liver Function Tests: Recent Labs  Lab 11/08/17 1900 11/10/17 0427 11/12/17 0808  AST 12*  --   --   ALT 8  --   --   ALKPHOS 90  --   --   BILITOT 0.5  --   --   PROT 7.8  --   --   ALBUMIN 2.4* 1.7* 1.8*   No results for input(s): LIPASE, AMYLASE in the last 168 hours. No results for input(s): AMMONIA in the last 168 hours. Coagulation Profile: No results for input(s): INR, PROTIME in the last 168 hours. CBC: Recent Labs  Lab 11/08/17 1900 11/09/17 0438 11/12/17 0535  WBC 15.8* 15.6* 12.1*  NEUTROABS 12.5*  --   --   HGB 10.1* 10.2* 8.4*  HCT 32.1* 32.5* 26.0*  MCV 88.9 89.5 85.0  PLT 533* 524* 388   Cardiac Enzymes: No results for input(s): CKTOTAL, CKMB, CKMBINDEX, TROPONINI in the last 168 hours. BNP: Invalid input(s): POCBNP CBG: Recent Labs  Lab 11/12/17 0650 11/12/17 0711 11/12/17 0724 11/12/17 1120 11/12/17 1613  GLUCAP 31* 125* 185* 140* 68*   HbA1C: No results for input(s): HGBA1C in the last 72 hours. Urine analysis:    Component Value Date/Time   COLORURINE YELLOW 11/08/2017 2007   APPEARANCEUR TURBID (A) 11/08/2017 2007   LABSPEC 1.009 11/08/2017 2007   PHURINE 6.0 11/08/2017 2007   GLUCOSEU NEGATIVE 11/08/2017 2007   HGBUR MODERATE (A) 11/08/2017 2007   BILIRUBINUR NEGATIVE 11/08/2017 2007   KETONESUR NEGATIVE 11/08/2017 2007   PROTEINUR 100 (A) 11/08/2017 2007   UROBILINOGEN 0.2 07/25/2014 2309   NITRITE NEGATIVE 11/08/2017 2007   LEUKOCYTESUR MODERATE (A) 11/08/2017 2007   Sepsis Labs: @LABRCNTIP (procalcitonin:4,lacticidven:4) ) Recent Results (from the past 240 hour(s))  Urine  culture     Status: Abnormal   Collection Time: 11/08/17  8:07 PM  Result Value Ref Range Status   Specimen Description   Final    URINE, RANDOM Performed at [facility], [address], [place], [place] [address]    Special Requests   Final    NONE Performed at [facility], [address] [place],  [address]    Culture >=100,000 COLONIES/mL ESCHERICHIA COLI (A)  Final   Report Status 11/11/2017 FINAL  Final   Organism ID, Bacteria ESCHERICHIA COLI (A)  Final      Susceptibility   Escherichia coli - MIC*    AMPICILLIN >=32 RESISTANT Resistant     CEFAZOLIN <=4 SENSITIVE Sensitive     CEFTRIAXONE <=1 SENSITIVE Sensitive     CIPROFLOXACIN <=0.25 SENSITIVE Sensitive     GENTAMICIN <=1 SENSITIVE Sensitive     IMIPENEM <=0.25 SENSITIVE Sensitive     NITROFURANTOIN <=16 SENSITIVE Sensitive     TRIMETH/SULFA <=20 SENSITIVE Sensitive     AMPICILLIN/SULBACTAM 16 INTERMEDIATE Intermediate     PIP/TAZO <=4 SENSITIVE Sensitive     Extended ESBL NEGATIVE Sensitive     * >=100,000 COLONIES/mL ESCHERICHIA COLI     Scheduled Meds: . calcium carbonate  1,000 mg of elemental calcium Oral TID WC  . Chlorhexidine Gluconate Cloth  6 each Topical Q0600  . feeding supplement (GLUCERNA SHAKE)  237 mL Oral BID BM  . insulin aspart  0-9 Units Subcutaneous TID WC  . insulin glargine  5 Units Subcutaneous Daily  . levETIRAcetam  500 mg Oral BID  . metoprolol succinate  25 mg Oral Daily  . tuberculin  5 Units Intradermal Once   Continuous Infusions: . sodium chloride 250 mL (11/11/17 1542)  . cefTRIAXone (ROCEPHIN)  IV 1 g (11/11/17 2112)  . dextrose 5 % and 0.9% NaCl 125 mL/hr at 11/12/17 1051  . furosemide 100 mg (11/12/17 1533)    Procedures/Studies: US Renal  Result Date: 11/09/2017 CLINICAL DATA:  62 year old female with chronic kidney disease stage 3. Subsequent encounter. EXAM: RENAL / URINARY TRACT ULTRASOUND COMPLETE COMPARISON:  11/08/2017 CT. FINDINGS: Right Kidney: Length:  10.1 cm. Minimal increased echogenicity. No hydronephrosis or mass. Left Kidney: Length: 10.5 cm. Echogenicity within normal limits. No mass or hydronephrosis visualized. Bladder: Debris within the bladder.  Bilateral ureteral stents in place. IMPRESSION: 1. Bilateral ureteral stents in place.  No hydronephrosis. 2. Debris within the urinary bladder. 3. Slight increased echogenicity right renal parenchyma, may reflect changes of medical renal disease. Electronically Signed   By: Genia Del M.D.   On: 11/09/2017 14:54   Dg Chest Port 1 View  Result Date: 11/09/2017 CLINICAL DATA:  PICC line placement EXAM: PORTABLE CHEST 1 VIEW COMPARISON:  10/06/2017 FINDINGS: Left internal jugular PICC line is been placed with the tip at the cavoatrial junction. No pneumothorax. Heart is normal size. Lungs clear. No effusions or acute bony abnormality. IMPRESSION: Left PICC line tip at the cavoatrial junction.  No pneumothorax. Electronically Signed   By: Rolm Baptise M.D.   On: 11/09/2017 12:58   Ct Renal Stone Study  Result Date: 11/08/2017 CLINICAL DATA:  62 year old female with UTI. History of kidney stones. Displacement of indwelling urethral stent. EXAM: CT ABDOMEN AND PELVIS WITHOUT CONTRAST TECHNIQUE: Multidetector CT imaging of the abdomen and pelvis was performed following the standard protocol without IV contrast. COMPARISON:  CT of the abdomen pelvis dated 12/07/2012 FINDINGS: Evaluation of this exam is limited in the absence of intravenous contrast. Lower chest: Bibasilar linear atelectasis/scarring. There is calcification of the mitral annulus. There is hypoattenuation of the cardiac blood pool suggestive of a degree of anemia. Clinical correlation is recommended. No intra-abdominal free air. Small ascites and diffuse mesenteric edema. Hepatobiliary: The liver is unremarkable. There is mild intrahepatic biliary ductal dilatation as seen on the prior CT. The gallbladder is not visualized, likely surgically  absent. Pancreas: The pancreas is atrophic with coarse calcifications likely sequela of chronic pancreatitis. Spleen: Normal in size without focal abnormality. Adrenals/Urinary Tract: The adrenal glands are unremarkable. Small faint hypodensities within the renal collecting systems on likely represent small nonobstructing calculi versus residual excreted contrast from recent IV administration. Clinical correlation is recommended. Bilateral pigtail ureteral stents noted. The proximal tip of the right ureteral stent is in the right renal upper pole collecting system and the proximal tip of the left ureteral stent is in the left renal pelvis. The distal ends of the stents are within the urinary bladder. There is no hydronephrosis on either side. Stomach/Bowel: There is diffuse thickened appearance of the wall of the stomach as well as thickened appearance of the colon, likely related to ascites. Gastritis or colitis are considered less likely. Clinical correlation is recommended. There is no bowel obstruction. The appendix is normal. Vascular/Lymphatic: The abdominal aorta and IVC are grossly unremarkable  on this noncontrast CT. No portal venous gas. Top-normal left retroperitoneal lymph nodes. Reproductive: Vascular calcification of the peripheral uterus noted. No pelvic mass. Other: Diffuse subcutaneous edema and anasarca. Musculoskeletal: There is degenerative changes of the spine. Grade 1 L4-L5 anterolisthesis. There is compression fracture of the superior endplate of the L2, age indeterminate but new since the prior CT of 2014. Compression fracture of the inferior endplate of X41 and O87. The T12 compression fracture is new since the prior CT. There is no retropulsed fragment. IMPRESSION: 1. Bilateral ureteral stents in place as described. No hydronephrosis. 2. Probable small bilateral renal calculi versus residual contrast from recent study. 3. Small ascites, diffuse edema, and anasarca of indeterminate etiology.  Clinical correlation is recommended. 4. Thickened stomach and colon, likely related to ascites. Electronically Signed   By: Anner Crete M.D.   On: 11/08/2017 21:41    Orson Eva, DO  [facility] Pager [phone]  If 7PM-7AM, please contact night-coverage [email or web address] Password [email or web address] 11/12/2017, 5:15 PM   LOS: 4 days

## 2017-11-12 NOTE — Progress Notes (Signed)
MD paged about patient's status....awaiting orders

## 2017-11-12 NOTE — Progress Notes (Signed)
CRITICAL VALUE ALERT  Critical Value:  Calcium 5.5  Date & Time Notied:  11/12/2017 @ 6924  Provider Notified: Dr. Keturah Barre Tat  Orders Received/Actions taken: Awaiting further orders.

## 2017-11-12 NOTE — Consult Note (Signed)
SURGICAL CONSULTATION NOTE (initial) - cpt: [phone]  HISTORY OF PRESENT ILLNESS (HPI):  62 y.o. female presented to [facility] ED 4 days ago for evaluation of acute kidney injury superimposed on chronic kidney disease 1 month following ureteral stent placement at [facility] for bilateral hydronephrosis, after which she was discharged about 1 week later (3 weeks ago). Patient is currently a poor historian, but is described as having acute encephalopathy attributed at least in part to uremia with nausea, poor appetite, diarrhea, weakness, abdominal pain, and dysuria. Patient in this context denies fever/chills, CP, or SOB.  Surgery is consulted by nephrology physician Dr. Lowanda Foster in this context for placement of temporary non-tunneled hemodialysis catheter.  PAST MEDICAL HISTORY (PMH):  Past Medical History:  Diagnosis Date  . Alcohol-induced pancreatitis   . Chronic diarrhea   . Depression   . Diabetes mellitus    fasting blood sugar 110-120s  . Diastolic CHF ([place])   . DKA (diabetic ketoacidoses) ([place])   . Gastroparesis   . GERD (gastroesophageal reflux disease)   . Heart murmur   . History of kidney stones   . Hyperlipidemia   . Hypertension   . Hypokalemia   . Muscle spasm   . Neuropathic pain   . Neuropathy    Hx: of  . Pyelonephritis   . Recurrent pancreatitis   . Seizures ([place])   . Vitamin B12 deficiency   . Vitamin D deficiency      PAST SURGICAL HISTORY ([place]):  Past Surgical History:  Procedure Laterality Date  . CATARACT EXTRACTION W/PHACO Right 03/14/2015   Procedure: CATARACT EXTRACTION PHACO AND INTRAOCULAR LENS PLACEMENT (IOC);  Surgeon: Baruch Goldmann, MD;  Location: [facility];  Service: Ophthalmology;  Laterality: Right;  CDE:11.13  . CATARACT EXTRACTION W/PHACO Left 04/11/2015   Procedure: CATARACT EXTRACTION PHACO AND INTRAOCULAR LENS PLACEMENT LEFT EYE CDE=9.68;  Surgeon: Baruch Goldmann, MD;  Location: [facility];  Service: Ophthalmology;  Laterality: Left;  . COLONOSCOPY   02/24/2010  . cystoscopy with ureteral stent  Bilateral 10/07/2017   At [place] N/A 06/13/2012   Procedure: MULTIPLE EXTRACION WITH ALVEOLOPLASTY EXTRACT: 18, 19, 20, 21, 22, 24, 25, 27, 28, 29, 30, 31;  Surgeon: Gae Bon, DDS;  Location: [place];  Service: Oral Surgery;  Laterality: N/A;  . TUBAL LIGATION    . URETERAL STENT PLACEMENT  09/2017     MEDICATIONS:  Prior to Admission medications   Medication Sig Start Date End Date Taking? Authorizing Provider  albuterol (PROAIR HFA) 108 (90 BASE) MCG/ACT inhaler Inhale 2 puffs into the lungs every 6 (six) hours as needed for wheezing or shortness of breath.   Yes [provider]  amLODipine (NORVASC) 5 MG tablet Take 5 mg by mouth daily.   Yes [provider]  cholecalciferol (VITAMIN D) 1000 units tablet Take 1,000 Units by mouth daily.   Yes [provider]  esomeprazole (NEXIUM) 40 MG capsule Take 40 mg by mouth daily.    Yes [provider]  hyoscyamine (ANASPAZ) 0.125 MG TBDP disintergrating tablet Take 0.125 mg by mouth 4 (four) times daily -  with meals and at bedtime.    Yes [provider]  insulin aspart (NOVOLOG) 100 UNIT/ML FlexPen Inject 3 Units into the skin 3 (three) times daily with meals. Patient taking differently: Inject 4-10 Units into the skin 3 (three) times daily with meals. Take 15 minutes before meals 09/20/13  Yes Samuella Cota, MD  Insulin Degludec (  TRESIBA FLEXTOUCH) 200 UNIT/ML SOPN Inject 10 Units into the skin daily with breakfast.    Yes [provider]  levETIRAcetam (KEPPRA) 1000 MG tablet Take 1 tablet (1,000 mg total) by mouth 2 (two) times daily. 09/20/13  Yes Samuella Cota, MD  linaclotide [facility]) 145 MCG CAPS capsule Take 145 mcg by mouth daily before breakfast.   Yes [provider]  magnesium oxide (MAG-OX) 400 MG tablet Take 400 mg by mouth daily.   Yes [provider]   metoCLOPramide (REGLAN) 5 MG tablet Take 5 mg by mouth 4 (four) times daily.   Yes [provider]  metoprolol succinate (TOPROL-XL) 100 MG 24 hr tablet Take 100 mg by mouth daily. Take with or immediately following a meal.   Yes [provider]  mirtazapine (REMERON) 15 MG tablet Take 15 mg by mouth at bedtime.   Yes [provider]  OxyCODONE HCl, Abuse Deter, (OXAYDO) 5 MG TABA Take 5 mg by mouth every 12 (twelve) hours as needed for pain.    Yes [provider]  PARoxetine (PAXIL) 20 MG tablet Take 20 mg by mouth daily.   Yes [provider]  rOPINIRole (REQUIP) 1 MG tablet Take 2 mg by mouth daily. BETWEEN 7 PM AND 8 PM.    Yes [provider]  sodium bicarbonate 650 MG tablet Take 650 mg by mouth 4 (four) times daily.   Yes [provider]     ALLERGIES:  Allergies  Allergen Reactions  . Aspirin Palpitations     SOCIAL HISTORY:  Social History   Socioeconomic History  . Marital status: Legally Separated    Spouse name: Not on file  . Number of children: Not on file  . Years of education: Not on file  . Highest education level: Not on file  Occupational History  . Not on file  Social Needs  . Financial resource strain: Not on file  . Food insecurity:    Worry: Not on file    Inability: Not on file  . Transportation needs:    Medical: Not on file    Non-medical: Not on file  Tobacco Use  . Smoking status: Never Smoker  . Smokeless tobacco: Current User    Types: Snuff  Substance and Sexual Activity  . Alcohol use: Yes    Alcohol/week: 1.0 standard drinks    Types: 1 Cans of beer per week    Comment: "sometimes i drink twice a week"  . Drug use: No  . Sexual activity: Not Currently    Comment: last drink 05/19/12 states 2 drinks 3 x's a week  Lifestyle  . Physical activity:    Days per week: Not on file    Minutes per session: Not on file  . Stress: Not on file  Relationships  . Social connections:     Talks on phone: Not on file    Gets together: Not on file    Attends religious service: Not on file    Active member of club or organization: Not on file    Attends meetings of clubs or organizations: Not on file    Relationship status: Not on file  . Intimate partner violence:    Fear of current or ex partner: Not on file    Emotionally abused: Not on file    Physically abused: Not on file    Forced sexual activity: Not on file  Other Topics Concern  . Not on file  Social History Narrative  .  Not on file    The patient currently resides (home / rehab facility / nursing home): Home The patient normally is (ambulatory / bedbound): Ambulatory   FAMILY HISTORY:  Family History  Problem Relation Age of Onset  . Diabetes Sister   . Chronic Renal Failure Neg Hx      REVIEW OF SYSTEMS:  Constitutional: denies weight loss, fever, chills, or sweats  Eyes: denies any other vision changes, history of eye injury  ENT: denies sore throat, hearing problems  Respiratory: denies shortness of breath, wheezing  Cardiovascular: denies chest pain, palpitations  Gastrointestinal: abdominal pain, N/V, and bowel function as per HPI Genitourinary: denies burning with urination or urinary frequency Musculoskeletal: denies any other joint pains or cramps  Skin: denies any other rashes or skin discolorations  Neurological: denies any other headache, dizziness, weakness  Psychiatric: denies any other depression, anxiety   All other review of systems were negative   VITAL SIGNS:  Temp:  [97.6 F (36.4 C)] 97.6 F (36.4 C) (09/26 2131) Pulse Rate:  [77-80] 77 (09/27 0655) BP: (121-136)/(69-77) 121/69 (09/27 0655) SpO2:  [92 %-100 %] 98 % (09/27 0655)     Height: 5' (152.4 cm) Weight: 43.1 kg BMI (Calculated): 18.56   INTAKE/OUTPUT:  This shift: Total I/O In: 120 [P.O.:120] Out: -   Last 2 shifts: @IOLAST2SHIFTS @   PHYSICAL EXAM:  Constitutional:  -- Normal body habitus  -- Awake and  oriented only to self, though redirectable and no apparent distress Eyes:  -- Pupils equally round and reactive to light  -- No scleral icterus, B/L no occular discharge Ear, nose, throat: -- Neck is FROM WNL -- No jugular venous distension  Pulmonary:  -- No wheezes or rhales -- Equal breath sounds bilaterally -- Breathing non-labored at rest Cardiovascular:  -- S1, S2 present  -- No pericardial rubs  Gastrointestinal:  -- Abdomen soft, nontender, non-distended, no guarding or rebound tenderness -- No abdominal masses appreciated, pulsatile or otherwise  Musculoskeletal and Integumentary:  -- Wounds or skin discoloration: None appreciated -- Extremities: B/L UE and LE FROM, hands and feet warm, no edema  Neurologic:  -- Motor function: Intact and symmetric -- Sensation: Intact and symmetric Psychiatric:  -- Mood and affect WNL  Pulse/Doppler Exam: (p=palpable; d=doppler signals; 0=none)     Right   Left   Fem  p   p  Labs:  CBC Latest Ref Rng & Units 11/12/2017 11/09/2017 11/08/2017  WBC 4.0 - 10.5 K/uL 12.1(H) 15.6(H) 15.8(H)  Hemoglobin 12.0 - 15.0 g/dL 8.4(L) 10.2(L) 10.1(L)  Hematocrit 36.0 - 46.0 % 26.0(L) 32.5(L) 32.1(L)  Platelets 150 - 400 K/uL 388 524(H) 533(H)   CMP Latest Ref Rng & Units 11/12/2017 11/11/2017 11/10/2017  Glucose 70 - 99 mg/dL 202(H) 142(H) 389(H)  BUN 8 - 23 mg/dL 68(H) 77(H) 80(H)  Creatinine 0.44 - 1.00 mg/dL 9.30(H) 9.67(H) 10.78(H)  Sodium 135 - 145 mmol/L 132(L) 133(L) 135  Potassium 3.5 - 5.1 mmol/L 3.2(L) 3.1(L) 4.2  Chloride 98 - 111 mmol/L 83(L) 90(L) 102  CO2 22 - 32 mmol/L 36(H) 31 19(L)  Calcium 8.9 - 10.3 mg/dL 5.5(LL) 5.7(LL) 6.6(L)  Total Protein 6.5 - 8.1 g/dL - - -  Total Bilirubin 0.3 - 1.2 mg/dL - - -  Alkaline Phos 38 - 126 U/L - - -  AST 15 - 41 U/L - - -  ALT 0 - 44 U/L - - -   Imaging studies: No new pertinent imaging studies available for review  Assessment/Plan: (  ICD-190's: N17.9, N18.9) 62 y.o. female with acute  on chronic kidney disease, complicated by pertinent comorbidities including DM with history of DKA, HTN, HLD, CKD, cardiac murmur, chronic diarrhea, nephrolithiasis with history of pyelonephritis, gastroparesis with GERD and a history of recurrent alcohol-associated pancreatitis, and a history of major depression disorder.   - discussed with patient and her son indications for hemodialysis as discussed with Dr. Lowanda Foster  - all risks, benefits, and alternatives to bedside insertion of temporary non-tunneled femoral central venous hemodialysis catheter were discussed with the patient and her family (son via phone), all of their questions were answered to their expressed satisfaction, patient expresses she wishes to proceed, and informed consent was obtained.   - will proceed with placement of temporary non-tunneled femoral venous hemodialysis access today   - medical management of comorbidities as per primary medical team and nephrology  - offered placement of tunneled hemodialysis catheter, but none available  - please call for catheter removal or if any questions or concerns  All of the above findings and recommendations were discussed with the patient and her son/POA (via phone), and all of patient's and her family's questions were answered to their expressed satisfaction.  Thank you for the opportunity to participate in this patient's care.   -- Marilynne Drivers Rosana Hoes, MD, [place]: [facility] General Surgery and Vascular - Partnering for exceptional care. [facility]: [phone]

## 2017-11-12 NOTE — Consult Note (Signed)
Subjective: CC: Pyelonephritis.     Hx:  I was asked to see Mrs. Kofman in consultation by Dr. 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   She was admitted to [facility] on 10/07/17 acute encephalopathy and AKI and was found to have emphysematous pyelonephritis.   Cystoscopy with bilateral ureteral stent insertion was performed by Dr. Ree Edman on 8/22.  She remained hospitalized on Zosyn for 12 days and was to have outpatient f/u for stent removal.   She was admitted to [facility] on 9/23 with anorexia along with abdominal pain and dysuria.   She had a CT that showed the stents in good position with out obstruction.   She had a Cr of 4 at discharge from [place] but it was 11.93 on admission on 9/23.   A urine culture has grown e. Coli.  She is currently on rocephin and is stable with normal VS.   She denies voiding complaints or abdominal pain today but she is a poor historian.  Her Cr. Has declined to 9.3 since admission.  She has a history of DM with prior DKA, gastroparesis and neuropathy and she has diastolic heart failure.  ROS:  Review of Systems  Gastrointestinal: Positive for nausea.  All other systems reviewed and are negative.   Allergies  Allergen Reactions  . Aspirin Palpitations    Past Medical History:  Diagnosis Date  . Alcohol-induced pancreatitis   . Chronic diarrhea   . Depression   . Diabetes mellitus    fasting blood sugar 110-120s  . Diastolic CHF ([place])   . DKA (diabetic ketoacidoses) ([place])   . Gastroparesis   . GERD (gastroesophageal reflux disease)   . Heart murmur   . History of kidney stones   . Hyperlipidemia   . Hypertension   . Hypokalemia   . Muscle spasm   . Neuropathic pain   . Neuropathy    Hx: of  . Pyelonephritis   . Recurrent pancreatitis   . Seizures ([place])   . Vitamin B12 deficiency   . Vitamin D deficiency     Past Surgical History:  Procedure Laterality Date  . CATARACT  EXTRACTION W/PHACO Right 03/14/2015   Procedure: CATARACT EXTRACTION PHACO AND INTRAOCULAR LENS PLACEMENT (IOC);  Surgeon: Baruch Goldmann, MD;  Location: [facility];  Service: Ophthalmology;  Laterality: Right;  CDE:11.13  . CATARACT EXTRACTION W/PHACO Left 04/11/2015   Procedure: CATARACT EXTRACTION PHACO AND INTRAOCULAR LENS PLACEMENT LEFT EYE CDE=9.68;  Surgeon: Baruch Goldmann, MD;  Location: [facility];  Service: Ophthalmology;  Laterality: Left;  . COLONOSCOPY  02/24/2010  . cystoscopy with ureteral stent  Bilateral 10/07/2017   At [place] N/A 06/13/2012   Procedure: MULTIPLE EXTRACION WITH ALVEOLOPLASTY EXTRACT: 18, 19, 20, 21, 22, 24, 25, 27, 28, 29, 30, 31;  Surgeon: Gae Bon, DDS;  Location: [facility];  Service: Oral Surgery;  Laterality: N/A;  . TUBAL LIGATION    . URETERAL STENT PLACEMENT  09/2017    Social History   Socioeconomic History  . Marital status: Legally Separated    Spouse name: Not on file  . Number of children: Not on file  . Years of education: Not on file  . Highest education level: Not on file  Occupational History  . Not on file  Social Needs  . Financial resource strain: Not on file  . Food insecurity:    Worry: Not on file  Inability: Not on file  . Transportation needs:    Medical: Not on file    Non-medical: Not on file  Tobacco Use  . Smoking status: Never Smoker  . Smokeless tobacco: Current User    Types: Snuff  Substance and Sexual Activity  . Alcohol use: Yes    Alcohol/week: 1.0 standard drinks    Types: 1 Cans of beer per week    Comment: "sometimes i drink twice a week"  . Drug use: No  . Sexual activity: Not Currently    Comment: last drink 05/19/12 states 2 drinks 3 x's a week  Lifestyle  . Physical activity:    Days per week: Not on file    Minutes per session: Not on file  . Stress: Not on file  Relationships  . Social connections:    Talks on phone: Not on file    Gets together:  Not on file    Attends religious service: Not on file    Active member of club or organization: Not on file    Attends meetings of clubs or organizations: Not on file    Relationship status: Not on file  . Intimate partner violence:    Fear of current or ex partner: Not on file    Emotionally abused: Not on file    Physically abused: Not on file    Forced sexual activity: Not on file  Other Topics Concern  . Not on file  Social History Narrative  . Not on file    Family History  Problem Relation Age of Onset  . Diabetes Sister   . Chronic Renal Failure Neg Hx     Anti-infectives: Anti-infectives (From admission, onward)   Start     Dose/Rate Route Frequency Ordered Stop   11/08/17 2200  cefTRIAXone (ROCEPHIN) 1 g in sodium chloride 0.9 % 100 mL IVPB     1 g 200 mL/hr over 30 Minutes Intravenous Every 24 hours 11/08/17 2148        Current Facility-Administered Medications  Medication Dose Route Frequency Provider Last Rate Last Dose  . 0.9 %  sodium chloride infusion   Intravenous PRN Tat, Shanon Brow, MD 10 mL/hr at 11/11/17 1542 250 mL at 11/11/17 1542  . acetaminophen (TYLENOL) tablet 650 mg  650 mg Oral Q6H PRN Johnson-Pitts, Endia, MD   650 mg at 11/12/17 0303   Or  . acetaminophen (TYLENOL) suppository 650 mg  650 mg Rectal Q6H PRN Johnson-Pitts, Endia, MD      . calcium carbonate (OS-CAL - dosed in mg of elemental calcium) tablet 1,000 mg of elemental calcium  1,000 mg of elemental calcium Oral TID WC Fran Lowes, MD   1,000 mg of elemental calcium at 11/12/17 1150  . cefTRIAXone (ROCEPHIN) 1 g in sodium chloride 0.9 % 100 mL IVPB  1 g Intravenous Q24H Johnson-Pitts, Endia, MD 200 mL/hr at 11/11/17 2112 1 g at 11/11/17 2112  . Chlorhexidine Gluconate Cloth 2 % PADS 6 each  6 each Topical Q0600 Befekadu, Belayenh, MD      . dextrose 5 %-0.9 % sodium chloride infusion   Intravenous Continuous Fran Lowes, MD 125 mL/hr at 11/12/17 1051    . feeding supplement  (GLUCERNA SHAKE) (GLUCERNA SHAKE) liquid 237 mL  237 mL Oral BID BM Barton Dubois, MD   237 mL at 11/12/17 0843  . furosemide (LASIX) 100 mg in dextrose 5 % 50 mL IVPB  100 mg Intravenous BID Fran Lowes, MD 60 mL/hr at 11/12/17 0121 100  mg at 11/12/17 0121  . insulin aspart (novoLOG) injection 0-9 Units  0-9 Units Subcutaneous TID WC Johnson-Pitts, Endia, MD   1 Units at 11/12/17 1150  . insulin glargine (LANTUS) injection 5 Units  5 Units Subcutaneous Daily Tat, David, MD   5 Units at 11/12/17 1150  . levETIRAcetam (KEPPRA) tablet 500 mg  500 mg Oral BID Barton Dubois, MD   500 mg at 11/12/17 0842  . metoprolol succinate (TOPROL-XL) 24 hr tablet 25 mg  25 mg Oral Daily Barton Dubois, MD   25 mg at 11/12/17 0842  . ondansetron (ZOFRAN) tablet 4 mg  4 mg Oral Q6H PRN Johnson-Pitts, Endia, MD       Or  . ondansetron (ZOFRAN) injection 4 mg  4 mg Intravenous Q6H PRN Johnson-Pitts, Endia, MD   4 mg at 11/11/17 1910  . senna-docusate (Senokot-S) tablet 1 tablet  1 tablet Oral QHS PRN Johnson-Pitts, Endia, MD      . tuberculin injection 5 Units  5 Units Intradermal Once Fran Lowes, MD         Objective: Vital signs in last 24 hours: Temp:  [97.6 F (36.4 C)-97.7 F (36.5 C)] 97.6 F (36.4 C) (09/26 2131) Pulse Rate:  [77-90] 77 (09/27 0655) Resp:  [16] 16 (09/26 1327) BP: (121-144)/(69-82) 121/69 (09/27 0655) SpO2:  [92 %-100 %] 98 % (09/27 0655)  Intake/Output from previous day: 09/26 0701 - 09/27 0700 In: 992 [P.O.:480; I.V.:12; IV Piggyback:500] Out: 700 [Urine:700] Intake/Output this shift: Total I/O In: 120 [P.O.:120] Out: -    Physical Exam  Constitutional: She appears well-developed and well-nourished. No distress.  Elderly, BF who looks much older than stated age.   Neck: Normal range of motion. Neck supple. No thyromegaly present.  Cardiovascular: Normal rate and regular rhythm.  Murmur heard. Pulmonary/Chest: Effort normal and breath sounds normal. No  respiratory distress.  Abdominal: Soft. She exhibits no mass. There is no tenderness. No hernia.  Musculoskeletal: Normal range of motion. She exhibits no edema or tenderness.  Neurological:  She is alert but is not able to provide fully clear responses to questions.   Skin: Skin is warm and dry.  Psychiatric: She has a normal mood and affect.    Lab Results:  Recent Labs    11/12/17 0535  WBC 12.1*  HGB 8.4*  HCT 26.0*  PLT 388   BMET Recent Labs    11/11/17 1100 11/12/17 0808  NA 133* 132*  K 3.1* 3.2*  CL 90* 83*  CO2 31 36*  GLUCOSE 142* 202*  BUN 77* 68*  CREATININE 9.67* 9.30*  CALCIUM 5.7* 5.5*   PT/INR No results for input(s): LABPROT, INR in the last 72 hours. ABG No results for input(s): PHART, HCO3 in the last 72 hours.  Invalid input(s): PCO2, PO2  Studies/Results: No results found.  I have reviewed extensive records from [place] along with reports from her outside CT and RUS and labs.  I have reviewed her CT films from this hospitalization, the hospital notes and labs.    Assessment: Emphysematous pyelonephritis treated with bilateral ureteral stents on 8/22 with no evidence of obstruction. She will need to have f/u to have the stents removed in the office.  She had recurrent e. Coli on culture and will need to remain on coverage until her stents are removed,  I will arrange f/u for stent removal.     CC: Dr. Shanon Brow Tat.      Irine Seal 11/12/2017 [phone]

## 2017-11-12 NOTE — Procedures (Signed)
SURGICAL PROCEDURE REPORT  DATE OF PROCEDURE: 11/12/2017  ATTENDING: Corene Cornea E. Rosana Hoes, MD  ANESTHESIA: Local  PRE-PROCEDURE DIAGNOSIS: Acute on chronic kidney disease (N17.9, N18.9)  POST-PROCEDURE DIAGNOSIS: Acute on chronic kidney disease (N17.9, N18.9)  PROCEDURE(S): Insertion of Right femoral vein non-tunneled temporary hemodialysis catheter using ultrasound guidance (cpt: 16384)  INTRAOPERATIVE FINDINGS: Non-pulsatile venous blood return with clear visualization of venous access vessel on ultrasound   ESTIMATED BLOOD LOSS: Minimal (<20 mL)   SPECIMENS: None   IMPLANTS: Non-tunneled temporary hemodialysis catheter  DRAINS: None   COMPLICATIONS: None apparent   CONDITION AT COMPLETION: Unchanged from pre-procedure   DISPOSITION: Remaining in med-surg bed, condition unchanged, dialysis RN updated   INDICATIONS FOR PROCEDURE: Patient is a 62 y.o. female with AKI/CKD requiring hemodialysis. All risks, benefits, and alternatives to above elective procedure were discussed with the patient and her family, who elected to proceed, and informed consent was accordingly obtained at that time.   DETAILS OF PROCEDURE: Patient was appropriately identified, and informed consent was confirmed to be obtained and documented in the patient's chart. Patient was placed in supine position, and access site was prepped and draped in the usual sterile fashion using chlorhexidine. Access site was identified using direct ultrasound visualization, and 1% lidocaine was injected for local anesthesia. Using standard Seldinger technique, access needle was inserted into the selected access vein with negative pressure applied to the attached syringe, and dark non-pulsatile venous blood was obtained. Loosely attached syringe was disconnected from the access needle, through which guidewire was advanced, over which access needle was withdrawn and dilator was advanced to skin level. A small skin incision was made  adjacent to the guidewire/dilator, allowing dilator to be advanced through subcutaneous tissues and withdrawn over secured guidewire with gentle pressure applied to the access site. Non-tunneled hemodialysis catheter, already flushed with sterile saline was then easily advanced over the guidewire, confirmed to flush easily, and secured to the patient's skin with 2-0 silk suture. Sterile dressing was applied, and all sharp instruments were appropriately disposed.  I was present for the entire procedure, and no complications were apparent.  Patient tolerated the procedure well.

## 2017-11-13 DIAGNOSIS — E43 Unspecified severe protein-calorie malnutrition: Secondary | ICD-10-CM

## 2017-11-13 LAB — BASIC METABOLIC PANEL
ANION GAP: 9 (ref 5–15)
BUN: 21 mg/dL (ref 8–23)
CALCIUM: 6.5 mg/dL — AB (ref 8.9–10.3)
CO2: 30 mmol/L (ref 22–32)
Chloride: 97 mmol/L — ABNORMAL LOW (ref 98–111)
Creatinine, Ser: 3.75 mg/dL — ABNORMAL HIGH (ref 0.44–1.00)
GFR calc Af Amer: 14 mL/min — ABNORMAL LOW (ref >60)
GFR, EST NON AFRICAN AMERICAN: 12 mL/min — AB (ref >60)
GLUCOSE: 105 mg/dL — AB (ref 70–99)
Potassium: 3.6 mmol/L (ref 3.5–5.1)
Sodium: 136 mmol/L (ref 135–145)

## 2017-11-13 LAB — RENAL FUNCTION PANEL
ALBUMIN: 1.7 g/dL — AB (ref 3.5–5.0)
Anion gap: 12 (ref 5–15)
BUN: 39 mg/dL — AB (ref 8–23)
CALCIUM: 5.9 mg/dL — AB (ref 8.9–10.3)
CO2: 30 mmol/L (ref 22–32)
CREATININE: 6.55 mg/dL — AB (ref 0.44–1.00)
Chloride: 93 mmol/L — ABNORMAL LOW (ref 98–111)
GFR calc Af Amer: 7 mL/min — ABNORMAL LOW (ref >60)
GFR calc non Af Amer: 6 mL/min — ABNORMAL LOW (ref >60)
Glucose, Bld: 130 mg/dL — ABNORMAL HIGH (ref 70–99)
PHOSPHORUS: 3.5 mg/dL (ref 2.5–4.6)
Potassium: 3 mmol/L — ABNORMAL LOW (ref 3.5–5.1)
SODIUM: 135 mmol/L (ref 135–145)

## 2017-11-13 LAB — GLUCOSE, CAPILLARY
GLUCOSE-CAPILLARY: 139 mg/dL — AB (ref 70–99)
GLUCOSE-CAPILLARY: 99 mg/dL (ref 70–99)
GLUCOSE-CAPILLARY: 90 mg/dL (ref 70–99)
Glucose-Capillary: 106 mg/dL — ABNORMAL HIGH (ref 70–99)

## 2017-11-13 LAB — HEPATITIS B SURFACE ANTIGEN: HEP B S AG: NEGATIVE

## 2017-11-13 LAB — HEPATITIS C ANTIBODY: HCV AB: 0.5 {s_co_ratio} (ref 0.0–0.9)

## 2017-11-13 MED ORDER — LEVETIRACETAM IN NACL 500 MG/100ML IV SOLN
500.0000 mg | Freq: Two times a day (BID) | INTRAVENOUS | Status: DC
  Administered 2017-11-13 – 2017-11-14 (×3): 500 mg via INTRAVENOUS
  Filled 2017-11-13 (×4): qty 100

## 2017-11-13 MED ORDER — SODIUM CHLORIDE 0.9 % IV SOLN
2.0000 g | Freq: Once | INTRAVENOUS | Status: AC
  Administered 2017-11-13: 2 g via INTRAVENOUS
  Filled 2017-11-13: qty 20

## 2017-11-13 MED ORDER — EPOETIN ALFA 4000 UNIT/ML IJ SOLN
INTRAMUSCULAR | Status: AC
  Administered 2017-11-13: 4000 [IU] via SUBCUTANEOUS
  Filled 2017-11-13: qty 1

## 2017-11-13 MED ORDER — METOPROLOL TARTRATE 5 MG/5ML IV SOLN
2.5000 mg | Freq: Two times a day (BID) | INTRAVENOUS | Status: DC
  Administered 2017-11-13: 2.5 mg via INTRAVENOUS
  Filled 2017-11-13 (×2): qty 5

## 2017-11-13 MED ORDER — STERILE WATER FOR INJECTION IJ SOLN
INTRAMUSCULAR | Status: AC
  Administered 2017-11-13: 4.4 mL
  Filled 2017-11-13: qty 10

## 2017-11-13 MED ORDER — ALTEPLASE 2 MG IJ SOLR
INTRAMUSCULAR | Status: AC
  Administered 2017-11-13: 3.1 mg
  Filled 2017-11-13: qty 4

## 2017-11-13 MED ORDER — SODIUM CHLORIDE 0.9 % IV SOLN: 100.0000 mL | INTRAVENOUS | Status: DC | PRN

## 2017-11-13 MED ORDER — EPOETIN ALFA 4000 UNIT/ML IJ SOLN
4000.0000 [IU] | INTRAMUSCULAR | Status: DC
  Administered 2017-11-13: 4000 [IU] via SUBCUTANEOUS
  Filled 2017-11-13: qty 1

## 2017-11-13 NOTE — Progress Notes (Addendum)
PROGRESS NOTE  Erica [name] YPP:[id number] DOB: 1955/05/07 DOA: 11/08/2017 PCP: Octavio Graves, DO   Brief History: 62 year old female with a history of diabetes mellitus, hypertension, seizure disorder presenting with generalized weakness x2 to 3 dayswith confusion.The patient was also complaining of lower abd pain with dysuria.The patient was recently hospitalized at [facility] from 10/07/2017 through 10/21/2017 during which she was treated for emphysematous pyelonephritis and acute on chronic renal failure. She also was treated for DKA and fluid overload.She was noted to have hemoglobin A1c of 12.1. She required transfusion 2 u PRBC without evidence of active bleeding.She underwent bilateral ureteral stenting on 10/07/2017. She was treated appropriately with antibiotics. She was discharged with instructions for outpatient cystoscopy and removal of stents in 2 to 3 weeks. Upon this admission, the patient has serum creatinine of 8.1. At the time of discharge, her serum creatinine has stabilized between 3.7-3.9.Nephrology was consulted to assist with management.  Urology was also consulted due to retained stents  Assessment/Plan: Acute on chronic renal failure--stage unspecified presently -likely volume depletion/hemodynamic changes -Appreciate nephrology consult -lasix and bicarbonate drips stopped as pt has started HD -11/12/17--case discussed with renal--Dr. Festus Aloe temporary HD catheter to initiate HD -ultimately will need Perm-Cath--defer timing to nephrology -Received HD 9/27 and 9/28  Pyelonephritis -11/08/17 urine culture = Ecoli -continue ceftriaxone -11/08/2017 CT renal protocol--bilateral ureteral stents without hydronephrosis. Small bilateral renal calculi. Small amount of ascites with anasarca. Thickened stomach may be related to ascites. -ureteral stents initially placed 10/07/17 -11/12/17--case discussed with urology, Dr.  Macario Golds plan for stent removal 11/16/17 if pt still in hospital.  If not, can be done outpt  Diabetes mellitus type 2, uncontrolled with hyperglycemia -d/c lantus as pt remains hypoglycemic with reduced dose -NovoLog sliding scale -10/07/17 A1C = 12.1  Essential hypertension -Continue metoprolol succinate>>>IV lopressor as pt has been refusing pills -Holding amlodipine secondary to soft blood pressure  Metabolic acidosis -Secondary to acute on chronic renal failure -Continue bicarbonate drip per nephrology  Seizure disorder -continue keppra>>change to IV as pt refusing to take po meds  Diarrhea -6 loose BMs in past 24 hours -check Cdiff  Severe malnutrition -continue supplements    Disposition Plan: Home in 3-4days  Family Communication:Son updated on phone 9/27   Consultants:nephrology, urology  Code Status: FULL  DVT Prophylaxis: SCDs   Procedures: As Listed in Progress Note Above  Antibiotics: Ceftriaxone 9/23>>>    Subjective: Patient denies fevers, chills, headache, chest pain, dyspnea, nausea, vomiting, diarrhea, abdominal pain, dysuria, hematuria, hematochezia, and melena.   Objective: Vitals:   11/12/17 2015 11/12/17 2107 11/13/17 0527 11/13/17 1254  BP: 111/62 105/60 (!) 100/53 127/70  Pulse: 84 88 77 76  Resp: 16 18 18 16   Temp: 97.9 F (36.6 C) 98.4 F (36.9 C) (!) 97.5 F (36.4 C) 97.6 F (36.4 C)  TempSrc: Oral Oral Oral Oral  SpO2:  98% 99% 99%  Weight:      Height:        Intake/Output Summary (Last 24 hours) at 11/13/2017 1544 Last data filed at 11/13/2017 1507 Gross per 24 hour  Intake 384.31 ml  Output 924 ml  Net -539.69 ml   Weight change:  Exam:   General:  Pt is alert, follows commands appropriately, not in acute distress  HEENT: No icterus, No thrush, No neck mass, Many/AT  Cardiovascular: RRR, S1/S2, no rubs, no gallops  Respiratory: CTA bilaterally, no wheezing, no crackles, no  rhonchi  Abdomen: Soft/+BS, non tender, non distended, no guarding  Extremities: No edema, No lymphangitis, No petechiae, No rashes, no synovitis   Data Reviewed: I have personally reviewed following labs and imaging studies Basic Metabolic Panel: Recent Labs  Lab 11/09/17 0438 11/10/17 0427 11/11/17 1100 11/12/17 0808 11/13/17 0824  NA 138 135 133* 132* 135  K 5.4* 4.2 3.1* 3.2* 3.0*  CL 114* 102 90* 83* 93*  CO2 9* 19* 31 36* 30  GLUCOSE 217* 389* 142* 202* 130*  BUN 79* 80* 77* 68* 39*  CREATININE 11.68* 10.78* 9.67* 9.30* 6.55*  CALCIUM 7.7* 6.6* 5.7* 5.5* 5.9*  PHOS  --  4.9*  --  4.6 3.5   Liver Function Tests: Recent Labs  Lab 11/08/17 1900 11/10/17 0427 11/12/17 0808 11/13/17 0824  AST 12*  --   --   --   ALT 8  --   --   --   ALKPHOS 90  --   --   --   BILITOT 0.5  --   --   --   PROT 7.8  --   --   --   ALBUMIN 2.4* 1.7* 1.8* 1.7*   No results for input(s): LIPASE, AMYLASE in the last 168 hours. No results for input(s): AMMONIA in the last 168 hours. Coagulation Profile: No results for input(s): INR, PROTIME in the last 168 hours. CBC: Recent Labs  Lab 11/08/17 1900 11/09/17 0438 11/12/17 0535  WBC 15.8* 15.6* 12.1*  NEUTROABS 12.5*  --   --   HGB 10.1* 10.2* 8.4*  HCT 32.1* 32.5* 26.0*  MCV 88.9 89.5 85.0  PLT 533* 524* 388   Cardiac Enzymes: No results for input(s): CKTOTAL, CKMB, CKMBINDEX, TROPONINI in the last 168 hours. BNP: Invalid input(s): POCBNP CBG: Recent Labs  Lab 11/12/17 1801 11/12/17 1829 11/12/17 2101 11/13/17 0726 11/13/17 1127  GLUCAP 44* 137* 88 106* 139*   HbA1C: No results for input(s): HGBA1C in the last 72 hours. Urine analysis:    Component Value Date/Time   COLORURINE YELLOW 11/08/2017 2007   APPEARANCEUR TURBID (A) 11/08/2017 2007   LABSPEC 1.009 11/08/2017 2007   PHURINE 6.0 11/08/2017 2007   GLUCOSEU NEGATIVE 11/08/2017 2007   HGBUR MODERATE (A) 11/08/2017 2007   BILIRUBINUR NEGATIVE 11/08/2017  2007   KETONESUR NEGATIVE 11/08/2017 2007   PROTEINUR 100 (A) 11/08/2017 2007   UROBILINOGEN 0.2 07/25/2014 2309   NITRITE NEGATIVE 11/08/2017 2007   LEUKOCYTESUR MODERATE (A) 11/08/2017 2007   Sepsis Labs: @LABRCNTIP (procalcitonin:4,lacticidven:4) ) Recent Results (from the past 240 hour(s))  Urine culture     Status: Abnormal   Collection Time: 11/08/17  8:07 PM  Result Value Ref Range Status   Specimen Description   Final    URINE, RANDOM Performed at [facility], [address] [place], [place] [address]    Special Requests   Final    NONE Performed at [facility], [address], [place], [place] [address]    Culture >=100,000 COLONIES/mL ESCHERICHIA COLI (A)  Final   Report Status 11/11/2017 FINAL  Final   Organism ID, Bacteria ESCHERICHIA COLI (A)  Final      Susceptibility   Escherichia coli - MIC*    AMPICILLIN >=32 RESISTANT Resistant     CEFAZOLIN <=4 SENSITIVE Sensitive     CEFTRIAXONE <=1 SENSITIVE Sensitive     CIPROFLOXACIN <=0.25 SENSITIVE Sensitive     GENTAMICIN <=1 SENSITIVE Sensitive     IMIPENEM <=0.25 SENSITIVE Sensitive     NITROFURANTOIN <=16 SENSITIVE Sensitive  TRIMETH/SULFA <=20 SENSITIVE Sensitive     AMPICILLIN/SULBACTAM 16 INTERMEDIATE Intermediate     PIP/TAZO <=4 SENSITIVE Sensitive     Extended ESBL NEGATIVE Sensitive     * >=100,000 COLONIES/mL ESCHERICHIA COLI     Scheduled Meds: . alteplase      . epoetin alfa      . sterile water (preservative free)      . Chlorhexidine Gluconate Cloth  6 each Topical Q0600  . epoetin (EPOGEN/PROCRIT) injection  4,000 Units Subcutaneous Q T,Th,Sa-HD  . feeding supplement (GLUCERNA SHAKE)  237 mL Oral BID BM  . insulin aspart  0-9 Units Subcutaneous TID WC  . metoprolol tartrate  2.5 mg Intravenous Q12H  . tuberculin  5 Units Intradermal Once   Continuous Infusions: . sodium chloride Stopped (11/11/17 1911)  . sodium chloride    . sodium chloride    . cefTRIAXone (ROCEPHIN)  IV Stopped  (11/12/17 2237)  . levETIRAcetam Stopped (11/13/17 1114)    Procedures/Studies: US Renal  Result Date: 11/09/2017 CLINICAL DATA:  62 year old female with chronic kidney disease stage 3. Subsequent encounter. EXAM: RENAL / URINARY TRACT ULTRASOUND COMPLETE COMPARISON:  11/08/2017 CT. FINDINGS: Right Kidney: Length: 10.1 cm. Minimal increased echogenicity. No hydronephrosis or mass. Left Kidney: Length: 10.5 cm. Echogenicity within normal limits. No mass or hydronephrosis visualized. Bladder: Debris within the bladder.  Bilateral ureteral stents in place. IMPRESSION: 1. Bilateral ureteral stents in place.  No hydronephrosis. 2. Debris within the urinary bladder. 3. Slight increased echogenicity right renal parenchyma, may reflect changes of medical renal disease. Electronically Signed   By: Genia Del M.D.   On: 11/09/2017 14:54   Dg Chest Port 1 View  Result Date: 11/09/2017 CLINICAL DATA:  PICC line placement EXAM: PORTABLE CHEST 1 VIEW COMPARISON:  10/06/2017 FINDINGS: Left internal jugular PICC line is been placed with the tip at the cavoatrial junction. No pneumothorax. Heart is normal size. Lungs clear. No effusions or acute bony abnormality. IMPRESSION: Left PICC line tip at the cavoatrial junction.  No pneumothorax. Electronically Signed   By: Rolm Baptise M.D.   On: 11/09/2017 12:58   Ct Renal Stone Study  Result Date: 11/08/2017 CLINICAL DATA:  62 year old female with UTI. History of kidney stones. Displacement of indwelling urethral stent. EXAM: CT ABDOMEN AND PELVIS WITHOUT CONTRAST TECHNIQUE: Multidetector CT imaging of the abdomen and pelvis was performed following the standard protocol without IV contrast. COMPARISON:  CT of the abdomen pelvis dated 12/07/2012 FINDINGS: Evaluation of this exam is limited in the absence of intravenous contrast. Lower chest: Bibasilar linear atelectasis/scarring. There is calcification of the mitral annulus. There is hypoattenuation of the cardiac blood  pool suggestive of a degree of anemia. Clinical correlation is recommended. No intra-abdominal free air. Small ascites and diffuse mesenteric edema. Hepatobiliary: The liver is unremarkable. There is mild intrahepatic biliary ductal dilatation as seen on the prior CT. The gallbladder is not visualized, likely surgically absent. Pancreas: The pancreas is atrophic with coarse calcifications likely sequela of chronic pancreatitis. Spleen: Normal in size without focal abnormality. Adrenals/Urinary Tract: The adrenal glands are unremarkable. Small faint hypodensities within the renal collecting systems on likely represent small nonobstructing calculi versus residual excreted contrast from recent IV administration. Clinical correlation is recommended. Bilateral pigtail ureteral stents noted. The proximal tip of the right ureteral stent is in the right renal upper pole collecting system and the proximal tip of the left ureteral stent is in the left renal pelvis. The distal ends of the stents are within  the urinary bladder. There is no hydronephrosis on either side. Stomach/Bowel: There is diffuse thickened appearance of the wall of the stomach as well as thickened appearance of the colon, likely related to ascites. Gastritis or colitis are considered less likely. Clinical correlation is recommended. There is no bowel obstruction. The appendix is normal. Vascular/Lymphatic: The abdominal aorta and IVC are grossly unremarkable on this noncontrast CT. No portal venous gas. Top-normal left retroperitoneal lymph nodes. Reproductive: Vascular calcification of the peripheral uterus noted. No pelvic mass. Other: Diffuse subcutaneous edema and anasarca. Musculoskeletal: There is degenerative changes of the spine. Grade 1 L4-L5 anterolisthesis. There is compression fracture of the superior endplate of the L2, age indeterminate but new since the prior CT of 2014. Compression fracture of the inferior endplate of D57 and I97. The T12  compression fracture is new since the prior CT. There is no retropulsed fragment. IMPRESSION: 1. Bilateral ureteral stents in place as described. No hydronephrosis. 2. Probable small bilateral renal calculi versus residual contrast from recent study. 3. Small ascites, diffuse edema, and anasarca of indeterminate etiology. Clinical correlation is recommended. 4. Thickened stomach and colon, likely related to ascites. Electronically Signed   By: Anner Crete M.D.   On: 11/08/2017 21:41    Orson Eva, DO  [facility] Pager [phone]  If 7PM-7AM, please contact night-coverage [email or web address] Password [facility] 11/13/2017, 3:44 PM   LOS: 5 days

## 2017-11-13 NOTE — Progress Notes (Signed)
Notified Dr. Carles Collet that patient is refusing all her pills, including keppra and calcium.

## 2017-11-13 NOTE — Procedures (Signed)
      HEMODIALYSIS TREATMENT NOTE (HD#2):   3 hour heparin-free dialysis completed via right femoral temporary catheter. Exit site unremarkable.  Goal met: 1 liter removed without interruption in ultrafiltration.  All blood was returned.  Catheter unable to tolerate prescribed Qb of 320m/min.   Max Qb 200, even after instillation of Alteplase for 1.5 hours.  Lines were reversed and cath limbs were flushed multiple times during treatment with no improvement.    Flexiseal now in place for frequent watery stools but pt remains incontinent of urine and unable to keep external urinary catheter in place.  Femoral HD cath dressing changed three times today.  High risk for bacteremia.  Hopefully, can have tunneled catheter placed soon.   ARockwell Alexandria RN

## 2017-11-13 NOTE — Progress Notes (Signed)
Subjective: Interval History: Patient states that she is feeling better.  Denies any nausea or vomiting.  Has diarrhea  Objective: Vital signs in last 24 hours: Temp:  [97.5 F (36.4 C)-98.4 F (36.9 C)] 97.5 F (36.4 C) (09/28 0527) Pulse Rate:  [74-88] 77 (09/28 0527) Resp:  [16-18] 18 (09/28 0527) BP: (94-130)/(53-74) 100/53 (09/28 0527) SpO2:  [98 %-99 %] 99 % (09/28 0527) Weight:  [50.5 kg] 50.5 kg (09/27 1800) Weight change:   Intake/Output from previous day: 09/27 0701 - 09/28 0700 In: 786.9 [P.O.:120; I.V.:516.7; IV Piggyback:150.2] Out: 924 [Urine:400] Intake/Output this shift: No intake/output data recorded.  Generally patient is alert and in no apparent distress Chest: Clear to auscultation no wheezing Heart exam revealed regular rate and rhythm, no murmur Extremities no edema  Lab Results: Recent Labs    11/12/17 0535  WBC 12.1*  HGB 8.4*  HCT 26.0*  PLT 388   BMET:  Recent Labs    11/11/17 1100 11/12/17 0808  NA 133* 132*  K 3.1* 3.2*  CL 90* 83*  CO2 31 36*  GLUCOSE 142* 202*  BUN 77* 68*  CREATININE 9.67* 9.30*  CALCIUM 5.7* 5.5*   No results for input(s): PTH in the last 72 hours. Iron Studies: No results for input(s): IRON, TIBC, TRANSFERRIN, FERRITIN in the last 72 hours.  Studies/Results: No results found.  I have reviewed the patient's current medications.  Assessment/Plan: 1] renal failure: Possibly acute on chronic.  Patient is started on dialysis yesterday.  Her appetite remains poor but does not have any nausea or vomiting. 2] anemia: Her hemoglobin remains within our target goal. 3] hyperkalemia: Her potassium is low. 4] bone and mineral disorder: Calcium is low but phosphorus is normal.  5] metabolic acidosis: Has corrected end of sodium bicarb. 6] hypertension: Her blood pressure is reasonably controlled 7] diabetes Plan:1] We will DC IV fluid 2] we will dialyze patient today for 3 hours 3] we will check a renal panel and  CBC in the morning. 4] we will DC IV Lasix 5] we will start patient on Epogen 4000 units IV after each dialysis.   LOS: 5 days   Cassara Nida S 11/13/2017,8:53 AM

## 2017-11-14 ENCOUNTER — Inpatient Hospital Stay (HOSPITAL_COMMUNITY): Payer: Medicaid Other

## 2017-11-14 DIAGNOSIS — Z992 Dependence on renal dialysis: Secondary | ICD-10-CM

## 2017-11-14 DIAGNOSIS — Z452 Encounter for adjustment and management of vascular access device: Secondary | ICD-10-CM

## 2017-11-14 LAB — BASIC METABOLIC PANEL
ANION GAP: 16 — AB (ref 5–15)
BUN: 23 mg/dL (ref 8–23)
CALCIUM: 7.4 mg/dL — AB (ref 8.9–10.3)
CO2: 26 mmol/L (ref 22–32)
Chloride: 96 mmol/L — ABNORMAL LOW (ref 98–111)
Creatinine, Ser: 4.81 mg/dL — ABNORMAL HIGH (ref 0.44–1.00)
GFR calc Af Amer: 10 mL/min — ABNORMAL LOW (ref >60)
GFR, EST NON AFRICAN AMERICAN: 9 mL/min — AB (ref >60)
GLUCOSE: 81 mg/dL (ref 70–99)
Potassium: 3.7 mmol/L (ref 3.5–5.1)
SODIUM: 138 mmol/L (ref 135–145)

## 2017-11-14 LAB — CBC
HCT: 24.1 % — ABNORMAL LOW (ref 36.0–46.0)
HCT: 25.2 % — ABNORMAL LOW (ref 36.0–46.0)
HEMATOCRIT: 22.9 % — AB (ref 36.0–46.0)
HEMOGLOBIN: 7 g/dL — AB (ref 12.0–15.0)
Hemoglobin: 7.6 g/dL — ABNORMAL LOW (ref 12.0–15.0)
Hemoglobin: 7.8 g/dL — ABNORMAL LOW (ref 12.0–15.0)
MCH: 27.9 pg (ref 26.0–34.0)
MCH: 27.5 pg (ref 26.0–34.0)
MCH: 27.5 pg (ref 26.0–34.0)
MCHC: 31.5 g/dL (ref 30.0–36.0)
MCHC: 31 g/dL (ref 30.0–36.0)
MCHC: 30.6 g/dL (ref 30.0–36.0)
MCV: 88.6 fL (ref 78.0–100.0)
MCV: 88.7 fL (ref 78.0–100.0)
MCV: 89.8 fL (ref 78.0–100.0)
PLATELETS: 282 10*3/uL (ref 150–400)
Platelets: 259 10*3/uL (ref 150–400)
Platelets: 258 10*3/uL (ref 150–400)
RBC: 2.72 MIL/uL — AB (ref 3.87–5.11)
RBC: 2.84 MIL/uL — ABNORMAL LOW (ref 3.87–5.11)
RBC: 2.55 MIL/uL — ABNORMAL LOW (ref 3.87–5.11)
RDW: 16.4 % — AB (ref 11.5–15.5)
RDW: 16.4 % — AB (ref 11.5–15.5)
RDW: 16.3 % — ABNORMAL HIGH (ref 11.5–15.5)
WBC: 11.9 10*3/uL — ABNORMAL HIGH (ref 4.0–10.5)
WBC: 12.5 10*3/uL — AB (ref 4.0–10.5)
WBC: 10.7 10*3/uL — AB (ref 4.0–10.5)

## 2017-11-14 LAB — C DIFFICILE QUICK SCREEN W PCR REFLEX
C DIFFICILE (CDIFF) INTERP: NOT DETECTED
C DIFFICILE (CDIFF) TOXIN: NEGATIVE
C Diff antigen: NEGATIVE

## 2017-11-14 LAB — RENAL FUNCTION PANEL
ALBUMIN: 1.8 g/dL — AB (ref 3.5–5.0)
ANION GAP: 13 (ref 5–15)
Albumin: 2 g/dL — ABNORMAL LOW (ref 3.5–5.0)
Anion gap: 14 (ref 5–15)
BUN: 23 mg/dL (ref 8–23)
BUN: 22 mg/dL (ref 8–23)
CALCIUM: 7.1 mg/dL — AB (ref 8.9–10.3)
CO2: 27 mmol/L (ref 22–32)
CO2: 23 mmol/L (ref 22–32)
Calcium: 7 mg/dL — ABNORMAL LOW (ref 8.9–10.3)
Chloride: 97 mmol/L — ABNORMAL LOW (ref 98–111)
Chloride: 99 mmol/L (ref 98–111)
Creatinine, Ser: 4.57 mg/dL — ABNORMAL HIGH (ref 0.44–1.00)
Creatinine, Ser: 4.81 mg/dL — ABNORMAL HIGH (ref 0.44–1.00)
GFR calc Af Amer: 10 mL/min — ABNORMAL LOW (ref >60)
GFR, EST AFRICAN AMERICAN: 11 mL/min — AB (ref >60)
GFR, EST NON AFRICAN AMERICAN: 9 mL/min — AB (ref >60)
GFR, EST NON AFRICAN AMERICAN: 9 mL/min — AB (ref >60)
Glucose, Bld: 80 mg/dL (ref 70–99)
Glucose, Bld: 125 mg/dL — ABNORMAL HIGH (ref 70–99)
PHOSPHORUS: 4.4 mg/dL (ref 2.5–4.6)
POTASSIUM: 3.6 mmol/L (ref 3.5–5.1)
Phosphorus: 3.6 mg/dL (ref 2.5–4.6)
Potassium: 3.6 mmol/L (ref 3.5–5.1)
SODIUM: 138 mmol/L (ref 135–145)
Sodium: 135 mmol/L (ref 135–145)

## 2017-11-14 LAB — GLUCOSE, CAPILLARY
Glucose-Capillary: 71 mg/dL (ref 70–99)
Glucose-Capillary: 80 mg/dL (ref 70–99)
Glucose-Capillary: 80 mg/dL (ref 70–99)
Glucose-Capillary: 97 mg/dL (ref 70–99)

## 2017-11-14 LAB — PROTIME-INR
INR: 1.47
PROTHROMBIN TIME: 17.7 s — AB (ref 11.4–15.2)

## 2017-11-14 LAB — TROPONIN I: Troponin I: <0.03 ng/mL (ref <0.03)

## 2017-11-14 LAB — APTT: aPTT: 38 seconds — ABNORMAL HIGH (ref 24–36)

## 2017-11-14 MED ORDER — LORAZEPAM 2 MG/ML IJ SOLN
2.0000 mg | Freq: Once | INTRAMUSCULAR | Status: AC
  Administered 2017-11-14: 2 mg via INTRAVENOUS
  Filled 2017-11-14: qty 1

## 2017-11-14 MED ORDER — LEVETIRACETAM IN NACL 1000 MG/100ML IV SOLN: 1000.0000 mg | INTRAVENOUS | Status: DC | PRN

## 2017-11-14 MED ORDER — SODIUM CHLORIDE 0.9 % IV SOLN: 100.0000 mL | INTRAVENOUS | Status: DC | PRN

## 2017-11-14 MED ORDER — LEVETIRACETAM IN NACL 1000 MG/100ML IV SOLN
1000.0000 mg | INTRAVENOUS | Status: DC
  Administered 2017-11-16 – 2017-11-19 (×4): 1000 mg via INTRAVENOUS
  Filled 2017-11-14 (×5): qty 100

## 2017-11-14 MED ORDER — SODIUM CHLORIDE 0.9 % IV SOLN
INTRAVENOUS | Status: DC
  Administered 2017-11-14 – 2017-11-18 (×5): via INTRAVENOUS
  Administered 2017-11-18: 100 mL via INTRAVENOUS
  Administered 2017-11-19 – 2017-11-20 (×4): via INTRAVENOUS

## 2017-11-14 MED ORDER — LIDOCAINE-PRILOCAINE 2.5-2.5 % EX CREA: 1.0000 "application " | TOPICAL_CREAM | CUTANEOUS | Status: DC | PRN

## 2017-11-14 MED ORDER — LIDOCAINE HCL (PF) 1 % IJ SOLN: 5.0000 mL | INTRAMUSCULAR | Status: DC | PRN

## 2017-11-14 MED ORDER — SODIUM CHLORIDE 0.9 % IV SOLN
15.0000 mg/kg | Freq: Once | INTRAVENOUS | Status: AC
  Administered 2017-11-14: 750 mg via INTRAVENOUS
  Filled 2017-11-14: qty 15

## 2017-11-14 MED ORDER — LEVETIRACETAM IN NACL 1000 MG/100ML IV SOLN
1000.0000 mg | Freq: Two times a day (BID) | INTRAVENOUS | Status: DC
  Filled 2017-11-14: qty 100

## 2017-11-14 MED ORDER — PENTAFLUOROPROP-TETRAFLUOROETH EX AERO: 1.0000 "application " | INHALATION_SPRAY | CUTANEOUS | Status: DC | PRN

## 2017-11-14 MED ORDER — LEVETIRACETAM IN NACL 500 MG/100ML IV SOLN: 500.0000 mg | INTRAVENOUS | Status: DC | PRN

## 2017-11-14 MED ORDER — LEVETIRACETAM IN NACL 500 MG/100ML IV SOLN: 500.0000 mg | Freq: Two times a day (BID) | INTRAVENOUS | Status: DC

## 2017-11-14 MED ORDER — DEXTROSE 50 % IV SOLN
INTRAVENOUS | Status: AC
  Filled 2017-11-14: qty 50

## 2017-11-14 MED ORDER — ALTEPLASE 2 MG IJ SOLR: 2.0000 mg | Freq: Once | INTRAMUSCULAR | Status: DC | PRN

## 2017-11-14 MED ORDER — SODIUM CHLORIDE 0.9 % IV SOLN
2.0000 g | INTRAVENOUS | Status: AC
  Administered 2017-11-15 – 2017-11-23 (×9): 2 g via INTRAVENOUS
  Filled 2017-11-14 (×5): qty 20
  Filled 2017-11-14: qty 2
  Filled 2017-11-14 (×5): qty 20

## 2017-11-14 MED ORDER — PHENYTOIN SODIUM 50 MG/ML IJ SOLN
100.0000 mg | Freq: Three times a day (TID) | INTRAMUSCULAR | Status: DC
  Administered 2017-11-15 – 2017-11-16 (×5): 100 mg via INTRAVENOUS
  Filled 2017-11-14 (×5): qty 2

## 2017-11-14 MED ORDER — DEXTROSE 50 % IV SOLN
25.0000 mL | Freq: Once | INTRAVENOUS | Status: AC
  Administered 2017-11-14: 25 mL via INTRAVENOUS

## 2017-11-14 NOTE — Progress Notes (Signed)
Patient transported to [facility] via [facility].

## 2017-11-14 NOTE — Consult Note (Signed)
Referring Provider: No ref. provider found Primary Care Physician:  Octavio Graves, DO Primary Nephrologist:  Dr. Lowanda Foster  Reason for Consultation:  acute kidney injury diabetes hypertension history of chronic renal insufficiency  HPI: This is a 62 year old lady with history of diabetes hypertension seizure disorder she presented to [facility] November 08 2017 planing of abdominal pain and dysuria.  In August she had been hospitalized in the [facility] with emphysematous pyelonephritis and acute tubular necrosis.  She had bilateral ureteral stents placed at that time by Dr. Ree Edman 10/07/2017 following a cystoscopy being discharged on 10/21/2017.  Having completed  the antibiotic Zosyn.  On admission she was found to have a creatinine that had increased from 4 on discharge to 11 on admission to  [facility].  CT scan of the abdomen pelvis showed  ureteral stents in place no evidence of hydronephrosis and urinalysis that showed pyuria with no bacteriuria.  She  was hemodynamically stable on admission.  She was placed on IV bicarbonate and a serum creatinine slowly improved to 9.3 on 11/12/2017.  She was seen by Dr. Lowanda Foster, who felt that dialysis would be indicated because her renal function had not improved significantly.  Dialysis was then initiated on 11/12/2017, through a non-tunneled femoral venous hemodialysis access.  She completed a dialysis session on 11/12/2017 which was uneventful and a second dialysis treatment on 11/13/2017.  Was noted that she continued to have incontinent stool and urine by the dialysis tech Ms. Whitman Hero.  She was seen this morning by Dr. Lowanda Foster, he felt that she did not require dialysis 11/14/2017 unfortunately during the course of 11/13/2017 and the morning of 11/14/2017 Erica [name] had developed some twitching on the left side of her face that was progressing from the left to the left upper extremity and becoming increasingly more lethargic less  interactive with a left-sided weakness.  She was seen by Dr. Graylon Good a teleneurologist who felt that she would need to be transferred to [facility] for an MRI of the brain and formal neurological consultation from a neurologist Dr. Cheral Marker.  Blood pressure 130/74 pulse of 99 temperature 97.7 O2 saturations 100% 2 L nasal cannula Sodium 138 potassium 3.7 chloride 96 CO2 26 BUN 23 creatinine 4.8 calcium 7.4 Albumin 2.0 phosphorus 3.6 WBC 12.5 hemoglobin 7.8 platelets 282 INR 1.47 C. difficile toxin is negative CT head revealed no evidence of acute intracranial pathology she had some small vessel changes         Past Medical History:  Diagnosis Date  . Alcohol-induced pancreatitis   . Chronic diarrhea   . Depression   . Diabetes mellitus    fasting blood sugar 110-120s  . Diastolic CHF ([place])   . DKA (diabetic ketoacidoses) ([place])   . Gastroparesis   . GERD (gastroesophageal reflux disease)   . Heart murmur   . History of kidney stones   . Hyperlipidemia   . Hypertension   . Hypokalemia   . Muscle spasm   . Neuropathic pain   . Neuropathy    Hx: of  . Pyelonephritis   . Recurrent pancreatitis   . Seizures ([place])   . Vitamin B12 deficiency   . Vitamin D deficiency     Past Surgical History:  Procedure Laterality Date  . CATARACT EXTRACTION W/PHACO Right 03/14/2015   Procedure: CATARACT EXTRACTION PHACO AND INTRAOCULAR LENS PLACEMENT (IOC);  Surgeon: Baruch Goldmann, MD;  Location: [facility];  Service: Ophthalmology;  Laterality: Right;  CDE:11.13  . CATARACT  EXTRACTION W/PHACO Left 04/11/2015   Procedure: CATARACT EXTRACTION PHACO AND INTRAOCULAR LENS PLACEMENT LEFT EYE CDE=9.68;  Surgeon: Baruch Goldmann, MD;  Location: [facility];  Service: Ophthalmology;  Laterality: Left;  . COLONOSCOPY  02/24/2010  . cystoscopy with ureteral stent  Bilateral 10/07/2017   At [place] N/A 06/13/2012   Procedure: MULTIPLE EXTRACION WITH  ALVEOLOPLASTY EXTRACT: 18, 19, 20, 21, 22, 24, 25, 27, 28, 29, 30, 31;  Surgeon: Gae Bon, DDS;  Location: [place];  Service: Oral Surgery;  Laterality: N/A;  . TUBAL LIGATION    . URETERAL STENT PLACEMENT  09/2017    Prior to Admission medications   Medication Sig Start Date End Date Taking? Authorizing Provider  albuterol (PROAIR HFA) 108 (90 BASE) MCG/ACT inhaler Inhale 2 puffs into the lungs every 6 (six) hours as needed for wheezing or shortness of breath.   Yes [provider]  amLODipine (NORVASC) 5 MG tablet Take 5 mg by mouth daily.   Yes [provider]  cholecalciferol (VITAMIN D) 1000 units tablet Take 1,000 Units by mouth daily.   Yes [provider]  esomeprazole (NEXIUM) 40 MG capsule Take 40 mg by mouth daily.    Yes [provider]  hyoscyamine (ANASPAZ) 0.125 MG TBDP disintergrating tablet Take 0.125 mg by mouth 4 (four) times daily -  with meals and at bedtime.    Yes [provider]  insulin aspart (NOVOLOG) 100 UNIT/ML FlexPen Inject 3 Units into the skin 3 (three) times daily with meals. Patient taking differently: Inject 4-10 Units into the skin 3 (three) times daily with meals. Take 15 minutes before meals 09/20/13  Yes Samuella Cota, MD  Insulin Degludec (TRESIBA FLEXTOUCH) 200 UNIT/ML SOPN Inject 10 Units into the skin daily with breakfast.    Yes [provider]  levETIRAcetam (KEPPRA) 1000 MG tablet Take 1 tablet (1,000 mg total) by mouth 2 (two) times daily. 09/20/13  Yes Samuella Cota, MD  linaclotide [facility]) 145 MCG CAPS capsule Take 145 mcg by mouth daily before breakfast.   Yes [provider]  magnesium oxide (MAG-OX) 400 MG tablet Take 400 mg by mouth daily.   Yes [provider]  metoCLOPramide (REGLAN) 5 MG tablet Take 5 mg by mouth 4 (four) times daily.   Yes [provider]  metoprolol succinate (TOPROL-XL) 100 MG 24 hr tablet Take 100 mg by mouth daily. Take with or  immediately following a meal.   Yes [provider]  mirtazapine (REMERON) 15 MG tablet Take 15 mg by mouth at bedtime.   Yes [provider]  OxyCODONE HCl, Abuse Deter, (OXAYDO) 5 MG TABA Take 5 mg by mouth every 12 (twelve) hours as needed for pain.    Yes [provider]  PARoxetine (PAXIL) 20 MG tablet Take 20 mg by mouth daily.   Yes [provider]  rOPINIRole (REQUIP) 1 MG tablet Take 2 mg by mouth daily. BETWEEN 7 PM AND 8 PM.    Yes [provider]  sodium bicarbonate 650 MG tablet Take 650 mg by mouth 4 (four) times daily.   Yes [provider]    Current Facility-Administered Medications  Medication Dose Route Frequency Provider Last Rate Last Dose  . 0.9 %  sodium chloride infusion   Intravenous PRN Orson Eva, MD   Stopped at 11/11/17 1911  . 0.9 %  sodium chloride infusion  100 mL Intravenous PRN Fran Lowes, MD      .  0.9 %  sodium chloride infusion  100 mL Intravenous PRN Befekadu, Eli Phillips, MD      . 0.9 %  sodium chloride infusion  100 mL Intravenous PRN Befekadu, Belayenh, MD      . 0.9 %  sodium chloride infusion  100 mL Intravenous PRN Befekadu, Eli Phillips, MD      . 0.9 %  sodium chloride infusion  100 mL Intravenous PRN Fran Lowes, MD      . 0.9 %  sodium chloride infusion  100 mL Intravenous PRN Fran Lowes, MD      . 0.9 %  sodium chloride infusion   Intravenous Continuous Tat, David, MD 100 mL/hr at 11/14/17 1225    . acetaminophen (TYLENOL) tablet 650 mg  650 mg Oral Q6H PRN Johnson-Pitts, Endia, MD   650 mg at 11/12/17 0303   Or  . acetaminophen (TYLENOL) suppository 650 mg  650 mg Rectal Q6H PRN Johnson-Pitts, Endia, MD      . alteplase (CATHFLO ACTIVASE) injection 2 mg  2 mg Intracatheter Once PRN Fran Lowes, MD      . cefTRIAXone (ROCEPHIN) 2 g in sodium chloride 0.9 % 100 mL IVPB  2 g Intravenous Q24H Tat, David, MD      . Chlorhexidine Gluconate Cloth 2 % PADS 6 each  6 each  Topical S3419 Fran Lowes, MD   6 each at 11/14/17 0706  . dextrose 50 % solution           . epoetin alfa (EPOGEN,PROCRIT) injection 4,000 Units  4,000 Units Subcutaneous Q T,Th,Sa-HD Fran Lowes, MD   4,000 Units at 11/13/17 2000  . feeding supplement (GLUCERNA SHAKE) (GLUCERNA SHAKE) liquid 237 mL  237 mL Oral BID BM Barton Dubois, MD   237 mL at 11/12/17 1536  . heparin injection 1,000 Units  1,000 Units Dialysis PRN Fran Lowes, MD      . insulin aspart (novoLOG) injection 0-9 Units  0-9 Units Subcutaneous TID WC Johnson-Pitts, Endia, MD   1 Units at 11/13/17 1139  . levETIRAcetam (KEPPRA) IVPB 500 mg/100 mL premix  500 mg Intravenous Therisa Doyne, MD 400 mL/hr at 11/14/17 1243 500 mg at 11/14/17 1243  . levETIRAcetam (KEPPRA) IVPB 500 mg/100 mL premix  500 mg Intravenous Q dialysis Kerney Elbe, MD      . lidocaine (PF) (XYLOCAINE) 1 % injection 5 mL  5 mL Intradermal PRN Fran Lowes, MD      . lidocaine-prilocaine (EMLA) cream 1 application  1 application Topical PRN Fran Lowes, MD      . ondansetron (ZOFRAN) tablet 4 mg  4 mg Oral Q6H PRN Johnson-Pitts, Endia, MD       Or  . ondansetron (ZOFRAN) injection 4 mg  4 mg Intravenous Q6H PRN Johnson-Pitts, Endia, MD   4 mg at 11/11/17 1910  . pentafluoroprop-tetrafluoroeth (GEBAUERS) aerosol 1 application  1 application Topical PRN Fran Lowes, MD      . phenytoin (DILANTIN) injection 100 mg  100 mg Intravenous Q8H Kerney Elbe, MD      . senna-docusate (Senokot-S) tablet 1 tablet  1 tablet Oral QHS PRN Johnson-Pitts, Endia, MD        Allergies as of 11/08/2017 - Review Complete 11/08/2017  Allergen Reaction Noted  . Aspirin Palpitations 10/29/2010    Family History  Problem Relation Age of Onset  . Diabetes Sister   . Chronic Renal Failure Neg Hx     Social History   Socioeconomic History  . Marital status: Legally Separated  Spouse name: Not on file  . Number of children: Not on  file  . Years of education: Not on file  . Highest education level: Not on file  Occupational History  . Not on file  Social Needs  . Financial resource strain: Not on file  . Food insecurity:    Worry: Not on file    Inability: Not on file  . Transportation needs:    Medical: Not on file    Non-medical: Not on file  Tobacco Use  . Smoking status: Never Smoker  . Smokeless tobacco: Current User    Types: Snuff  Substance and Sexual Activity  . Alcohol use: Yes    Alcohol/week: 1.0 standard drinks    Types: 1 Cans of beer per week    Comment: "sometimes i drink twice a week"  . Drug use: No  . Sexual activity: Not Currently    Comment: last drink 05/19/12 states 2 drinks 3 x's a week  Lifestyle  . Physical activity:    Days per week: Not on file    Minutes per session: Not on file  . Stress: Not on file  Relationships  . Social connections:    Talks on phone: Not on file    Gets together: Not on file    Attends religious service: Not on file    Active member of club or organization: Not on file    Attends meetings of clubs or organizations: Not on file    Relationship status: Not on file  . Intimate partner violence:    Fear of current or ex partner: Not on file    Emotionally abused: Not on file    Physically abused: Not on file    Forced sexual activity: Not on file  Other Topics Concern  . Not on file  Social History Narrative  . Not on file    Review of Systems: Unobtainable of systems patient follows commands but is catatonic  Physical Exam: Vital signs in last 24 hours: Temp:  [97.7 F (36.5 C)-99 F (37.2 C)] 97.7 F (36.5 C) (09/29 1511) Pulse Rate:  [77-112] 99 (09/29 1511) Resp:  [16-18] 18 (09/29 1511) BP: (87-152)/(27-87) 130/74 (09/29 1511) SpO2:  [97 %-100 %] 100 % (09/29 1511) Weight:  [50 kg-51.2 kg] 50 kg (09/28 2230) Last BM Date: 11/14/17 General: Alert cachectic ill-appearing lady Head:  Normocephalic and atraumatic. Eyes:  Sclera  clear, no icterus.   Conjunctiva pink. Ears:  Normal auditory acuity. Nose:  No deformity, discharge,  or lesions. Mouth:  No deformity or lesions, dentition normal.  Tongue dry coated Neck:  Supple; no masses or thyromegaly. JVP not elevated Lungs:  Clear throughout to auscultation.   No wheezes, crackles, or rhonchi. No acute distress. Heart:  Regular rate and rhythm; no murmurs, clicks, rubs,  or gallops. Abdomen: Scaphoid abdomen with no organomegaly. Msk:  Symmetrical without gross deformities. Normal posture. Pulses:  No carotid, renal, femoral bruits. DP and PT symmetrical and equal Extremities:  Without clubbing or edema.  Right femoral dialysis catheter non-tunneled right IJ catheter Neurologic:  Alert and  oriented x4;  grossly normal neurologically. Skin:  Intact without significant lesions or rashes.  Dry skin no digital infarcts no lesions   Intake/Output from previous day: 09/28 0701 - 09/29 0700 In: 506.5 [I.V.:11.9; IV Piggyback:494.6] Out: 1145  Intake/Output this shift: No intake/output data recorded.  Lab Results: Recent Labs    11/12/17 0535 11/14/17 0656 11/14/17 1045  WBC 12.1* 11.9* 12.5*  HGB 8.4* 7.6* 7.8*  HCT 26.0* 24.1* 25.2*  PLT 388 259 282   BMET Recent Labs    11/12/17 0808 11/13/17 0824 11/13/17 2056 11/14/17 0656 11/14/17 1045  NA 132* 135 136 138 138  K 3.2* 3.0* 3.6 3.6 3.7  CL 83* 93* 97* 97* 96*  CO2 36* 30 30 27 26   GLUCOSE 202* 130* 105* 80 81  BUN 68* 39* 21 23 23   CREATININE 9.30* 6.55* 3.75* 4.57* 4.81*  CALCIUM 5.5* 5.9* 6.5* 7.1* 7.4*  PHOS 4.6 3.5  --  3.6  --    LFT Recent Labs    11/14/17 0656  ALBUMIN 2.0*   PT/INR Recent Labs    11/14/17 1045  LABPROT 17.7*  INR 1.47   Hepatitis Panel Recent Labs    11/12/17 0912  HEPBSAG Negative  HCVAB 0.5    Studies/Results: Ct Head Code Stroke Wo Contrast  Result Date: 11/14/2017 CLINICAL DATA:  Code stroke. LEFT-sided weakness and facial droop with  drooling. EXAM: CT HEAD WITHOUT CONTRAST TECHNIQUE: Contiguous axial images were obtained from the base of the skull through the vertex without intravenous contrast. COMPARISON:  09/11/2013. FINDINGS: Brain: No evidence for acute infarction, hemorrhage, mass lesion, hydrocephalus, or extra-axial fluid. Generalized atrophy, premature for age. Hypoattenuation of white matter consistent with small vessel disease. Vascular: Calcification of the cavernous internal carotid arteries consistent with cerebrovascular atherosclerotic disease. No signs of intracranial large vessel occlusion. Skull: Calvarium intact. Sinuses/Orbits: No sinus disease.  Negative orbits. Other: None. ASPECTS [facility] Stroke Program Early CT Score) - Ganglionic level infarction (caudate, lentiform nuclei, internal capsule, insula, M1-M3 cortex): 7 - Supraganglionic infarction (M4-M6 cortex): 3 Total score (0-10 with 10 being normal): 10 IMPRESSION: 1. Atrophy and small vessel disease, mildly progressed from 2015. No acute intracranial findings. No signs of large vessel occlusion. 2. ASPECTS is 10. 3. A text page was placed to the ordering provider at 11:17 a.m. Electronically Signed   By: Staci Righter M.D.   On: 11/14/2017 11:23    Assessment/Plan:  Acute on chronic renal insufficiency.  Seems to have developed after episode of pyelonephritis and hospitalization at [facility] stay in [place] when she was admitted for an episode of acute emphysematous pyelonephritis.  She underwent urological manipulation with placement of double-J stents.  She was readmitted after briefly spending 2 weeks at home with increasing confusion and possible E. coli urinary tract infection.  Worsening renal insufficiency with a creatinine that increased from 4.0 on discharge to 11.93 on admission.  Rehydration with IV fluids failed to produce significant improvement in serum creatinine.  Decision made to initiate dialysis 11/12/2017 she underwent 2 successful  hemodialysis treatments 11/12/2017 and 11/13/2017.  These appear to have been uneventful.  Approximately 500 cc were removed 9/27 /2019 and 1000 cc removed 11/13/2017.  Loose stool noted in Flexi-Seal and urinary incontinence.  Been nonoliguric.  She is evaluated by Dr. Jeffie Pollock for urology 11/11/2017 recommendations left.  Double-J stents left in place and recommended continued antibiotic therapy.  Developed seizure activity with negative head CT transferred for formal neurological consultation and MRI.  Dialysis dependent acute renal failure.  No indications for acute dialysis at this present time.  No electrolyte abnormalities noted that required correction.  Dialysis access temporary femoral right dialysis catheter placed by general surgery in [facility] 11/12/2017  Hypertension appears stable no antihypertensive medications blood pressure appears to be controlled  Anemia does not appear to be an issue at this time  Seizure on background  of history of seizure disorders and question of alcohol abuse.  Formal neurology consultation patient on Keppra intravenously received IV Dilantin and IV Ativan to try to control seizure activity  E. coli UTI treated with Rocephin since 11/11/2017  Diabetes mellitus as per primary team  Thank you very much for this most interesting consult we will continue to follow with you.     LOS: [place] @TODAY @3 :42 PM

## 2017-11-14 NOTE — Progress Notes (Signed)
Subjective: Interval History: Patient presently denies any nausea or vomiting appetite is getting better.  Denies any difficulty breathing.  Objective: Vital signs in last 24 hours: Temp:  [97.6 F (36.4 C)-98.2 F (36.8 C)] 98.2 F (36.8 C) (09/29 2800) Pulse Rate:  [76-102] 102 (09/29 0633) Resp:  [16] 16 (09/29 0633) BP: (127-152)/(52-85) 148/81 (09/29 0633) SpO2:  [98 %-100 %] 100 % (09/29 3491) Weight:  [50 kg-51.2 kg] 50 kg (09/28 2230) Weight change: 0.7 kg  Intake/Output from previous day: 09/28 0701 - 09/29 0700 In: 506.5 [I.V.:11.9; IV Piggyback:494.6] Out: 1145  Intake/Output this shift: No intake/output data recorded.  Generally: Patient is consistently was regular twitching of her face and neck mainly to the left.  Has been going on throughout the night.  Patient however is alert and oriented.  She answers questions. Chest: Clear to auscultation no wheezing Heart exam revealed regular rate and rhythm, no murmur Extremities no edema  Lab Results: Recent Labs    11/12/17 0535 11/14/17 0656  WBC 12.1* 11.9*  HGB 8.4* 7.6*  HCT 26.0* 24.1*  PLT 388 259   BMET:  Recent Labs    11/13/17 2056 11/14/17 0656  NA 136 138  K 3.6 3.6  CL 97* 97*  CO2 30 27  GLUCOSE 105* 80  BUN 21 23  CREATININE 3.75* 4.57*  CALCIUM 6.5* 7.1*   No results for input(s): PTH in the last 72 hours. Iron Studies: No results for input(s): IRON, TIBC, TRANSFERRIN, FERRITIN in the last 72 hours.  Studies/Results: No results found.  I have reviewed the patient's current medications.  Assessment/Plan: 1] renal failure: Possibly acute on chronic.  She is status post hemodialysis yesterday.  Presently no nausea or vomiting.  Potassium is normal. 2] anemia: Her hemoglobin is below target goal.  Presently she is on Epogen during dialysis. 3] hyperkalemia: Her potassium is normal. 4] bone and mineral disorder: Calcium and phosphorus is a range. 5] metabolic acidosis: Her CO2 is 27  corrected. 6] hypertension: Her blood pressure is reasonably controlled 7] diabetes 8] continuous twitching: Etiology not clear.  Presently she is on Keppra. Plan:1] patient does require dialysis today 2] we will dialyze patient tomorrow for 31/2 hours 3] we will check a renal panel and CBC in the morning. 4] we will send patient for tunneled catheter placement possibly Tuesday.    LOS: 6 days   Ohana Birdwell S 11/14/2017,9:04 AM

## 2017-11-14 NOTE — Progress Notes (Signed)
CODE STROKE on In patient 1040 am  Call time Exam delay - physician examining the patient. Pt arrived in CT with no order for exam placed.  1057 exam began 1058 exam finished 1058 images to [facility] 1100 exam completet in Epic, called [facility] Radiology spoke with Carloyn Manner.

## 2017-11-14 NOTE — Consult Note (Signed)
   [facility] TeleNeurology Consult Services  Impression: Epilepsia partialis continua.  I don't think this is stroke, I think that she is having intractable partial seizures with post-ictal paralysis.    -Not a tpa candidate due to:   Presentation not thought to represent stroke.  Comments:   Last known well (LKW) time:   9/28 evening Door Time:  In-house [facility] contacted:  11:29 [facility] at bedside:  11:34 NIHSS assessment start time:  11:36 NIHSS assessment end time:  11:52  Recommendations: -No role for IV tpA or emergent advanced imaging (CTA/CTP). -Fosphenytoin 15 mg/kg IV and Levetiracetam 500 mg IV STAT. -Transfer to [facility] for cEEG monitoring.  Discussed with ED MD. Please call with questions.  Verdis Prime, MD TeleNeurology -----------------------------------------------------------------------------------------  CC: L face/body twitching, L sided weakness.  History of Present Illness: 62 yo woman with h/o DM2, HTN, CKD< epilepsy (on Keppra).  Recent admission for pyelonephritis.  Current admission for renal failure, received dialysi.  Developed L facial twitching last night, worse this morning.  Twitching actually progressed from the L face into the L UE and LE this morning at 10:00.  Since this time, more lethargic, less interactive, and L sided weakness.  S/p 2 mg IV lorazepam with improvement in L facial twitching.  Diagnostic: Labs reviewed. Head CT:  No ICH/acute pathology.  Exam: Lethargic, encephalopathic not clearly aphasic.  Frequent L facial twitching.  Rare L LE twitching.  Looks like seizure activity.  L hemiparesis.  Slightly less reaction to pain on L.   NIHSS score:  10  -LOC:  1  -LOC Questions:  0  -LOC Commands:  0  -Best Gaze:  0  -Visual:  0  -Facial Palsy:  0  -Motor Arm (Left):  3   -Motor Arm (Right):  0  -Motor Leg (Left):  3  -Motor Leg (Right):  0  -Limb Ataxia:   0  -Sensory:  1  -Best Language:   1  -Dysarthria:  1  -Extinction/Inattention:  0  Medical Decision Making: - Extensive number of diagnosis or management options are considered above. - Extensive amount of complex data reviewed. - High risk of complication and/or morbidity or mortality are associated with differential diagnostic considerations above. - There may be uncertain outcome and increased probability of prolonged functional impairment or high probability of severe prolonged functional impairment associated with some of these differential diagnosis.  Medical Data Reviewed: 1.  Data reviewed include clinical labs, radiology,Medical Tests; 2.  Tests results discussed w/performing or interpreting physician; 3.  Obtaining/reviewing old medical records; 4.  Obtaining case history from another source; 5.  Independent review of image, tracing or specimen.  Patient was informed the Neurology Consult would happen via TeleHealth consult by way of interactive audio and video telecommunications and consented to receiving care in this manner.

## 2017-11-14 NOTE — Progress Notes (Signed)
Patient has a bed at [place], [place], [address]. Informed patient's son that patient was being transferred to [facility].

## 2017-11-14 NOTE — Progress Notes (Addendum)
Received patient from [facility], assessment completed, VS documented, oriented patient to the room.  CHG bath completed, MRSA swab sent, Will continue to monitor.

## 2017-11-14 NOTE — Consult Note (Addendum)
NEURO HOSPITALIST CONSULT NOTE   Requestig physician: Dr. Candiss Norse  Reason for Consult: Breakthrough seizure  History obtained from:  Chart  HPI:                                                                                                                                          Erica [name] is an 62 y.o. female  With PMH significant for DM 2, HTN and seizure disorder who presented to [facility] for weakness on 9/23. She was transferred to [facility] 9/29 for neurology consultation regarding breakthrough seizure after dialysis.  Per chart: patient was admitted to [facility] on 9/23 for a 4 day hsitory of increasing weakness. At that time she has been lying in bed and not as active as usual.  she was hospitalized for 2 weeks  10/07/17- 10/21/17 with emphysematous pyelonephritis and  acute on chronic renal failure..  She had ureteral stents placed at that time.   New ESRD diagnosis with dialysis catheter placement this admission and has had 2 rounds of dialysis. Next due Monday 9/30. Earlier today at [facility] patient was noted to have facial twitching. She was then noted to have left arm and leg twitching with dysarthria. She could not lift up her left side. She was given 2 mg ativan with cessation of twitching but still could not lift left arm or leg. Transferred to  [facility] for neurology consult.  Patient has been on keppra since 2015. At that time she was prescribed keppra for hypoglycemia-induced seizure.(glucose 10)  Hospital course:  Non-tunneled dialysis catheter placed 9/27 9/29 tele neurology consulted CT head: no acute abnormality Creatinine: 4.81 GFR: 10  hgbA1c: 12.1  Past Medical History:  Diagnosis Date  . Alcohol-induced pancreatitis   . Chronic diarrhea   . Depression   . Diabetes mellitus    fasting blood sugar 110-120s  . Diastolic CHF ([place])   . DKA (diabetic ketoacidoses) ([place])   . Gastroparesis   . GERD (gastroesophageal reflux disease)   . Heart  murmur   . History of kidney stones   . Hyperlipidemia   . Hypertension   . Hypokalemia   . Muscle spasm   . Neuropathic pain   . Neuropathy    Hx: of  . Pyelonephritis   . Recurrent pancreatitis   . Seizures ([place])   . Vitamin B12 deficiency   . Vitamin D deficiency     Past Surgical History:  Procedure Laterality Date  . CATARACT EXTRACTION W/PHACO Right 03/14/2015   Procedure: CATARACT EXTRACTION PHACO AND INTRAOCULAR LENS PLACEMENT (IOC);  Surgeon: Baruch Goldmann, MD;  Location: [facility];  Service: Ophthalmology;  Laterality: Right;  CDE:11.13  . CATARACT EXTRACTION W/PHACO Left 04/11/2015   Procedure: CATARACT EXTRACTION PHACO AND INTRAOCULAR LENS PLACEMENT LEFT  EYE CDE=9.68;  Surgeon: Baruch Goldmann, MD;  Location: [facility];  Service: Ophthalmology;  Laterality: Left;  . COLONOSCOPY  02/24/2010  . cystoscopy with ureteral stent  Bilateral 10/07/2017   At [facility] N/A 06/13/2012   Procedure: MULTIPLE EXTRACION WITH ALVEOLOPLASTY EXTRACT: 18, 19, 20, 21, 22, 24, 25, 27, 28, 29, 30, 31;  Surgeon: Gae Bon, DDS;  Location: [place];  Service: Oral Surgery;  Laterality: N/A;  . TUBAL LIGATION    . URETERAL STENT PLACEMENT  09/2017    Family History  Problem Relation Age of Onset  . Diabetes Sister   . Chronic Renal Failure Neg Hx          Social History:  reports that she has never smoked. Her smokeless tobacco use includes snuff. She reports that she drinks about 1.0 standard drinks of alcohol per week. She reports that she does not use drugs.  Allergies  Allergen Reactions  . Aspirin Palpitations    MEDICATIONS:                                                                                                                     Scheduled: . Chlorhexidine Gluconate Cloth  6 each Topical Q0600  . dextrose      . epoetin (EPOGEN/PROCRIT) injection  4,000 Units Subcutaneous Q T,Th,Sa-HD  . feeding supplement (GLUCERNA SHAKE)   237 mL Oral BID BM  . insulin aspart  0-9 Units Subcutaneous TID WC  . phenytoin (DILANTIN) IV  100 mg Intravenous Q8H   Continuous: . sodium chloride Stopped (11/11/17 1911)  . sodium chloride    . sodium chloride    . sodium chloride    . sodium chloride    . sodium chloride    . sodium chloride    . sodium chloride Stopped (11/14/17 1408)  . cefTRIAXone (ROCEPHIN)  IV    . levETIRAcetam Stopped (11/14/17 1258)  . levETIRAcetam     DXI:PJASNK chloride, sodium chloride, sodium chloride, sodium chloride, sodium chloride, sodium chloride, sodium chloride, acetaminophen **OR** acetaminophen, alteplase, heparin, levETIRAcetam, lidocaine (PF), lidocaine-prilocaine, ondansetron **OR** ondansetron (ZOFRAN) IV, pentafluoroprop-tetrafluoroeth, senna-docusate   ROS:                                                                                                                                       History obtained from unobtainable from patient due  to poor historian   Blood pressure 130/74, pulse 99, temperature 97.7 F (36.5 C), temperature source Oral, resp. rate 18, height 5' (1.524 m), weight 50 kg, SpO2 100 %.   General Examination:                                                                                                      Physical Exam  HEENT-  Normocephalic, no lesions, without obvious abnormality.  Normal external eye and conjunctiva. Edentulous Cardiovascular- S1-S2 audible, pulses palpable throughout   Lungs-no rhonchi or wheezing noted, no excessive working breathing.  Saturations within normal limits on 2 L [place] Abdomen- All 4 quadrants palpated and nontender Extremities- Warm, dry and intact Musculoskeletal-no joint tenderness, deformity or swelling Skin-warm and dry, no hyperpigmentation, vitiligo, or suspicious lesions  Neurological Examination Mental Status: Drowsy but oriented to, name/age/ month.  Year is "1990".   Dysarthria noted.  Able to follow simple  commands without difficulty. Cranial Nerves: II: Able to fixate and track examiner,  III,IV, VI: ptosis not present, extra-ocular motions intact bilaterally pupils equal, round, reactive to light  V,VII: smile appears asymmetric in the presence of being edentulous,  VIII: hearing normal bilaterally IX,X: uvula rises symmetrically XI: bilateral shoulder shrug XII: midline tongue extension Motor: No twitching or jerking movements noted on exam. Right : Upper extremity   4/5  Left:     Upper extremity   3/5 Weak hand grips bilaterally with right side stronger than left.  Lower extremity   4/5      Lower extremity   3/5 Left foot internally rotated Tone and bulk:normal tone throughout; no atrophy noted Sensory:  light touch intact throughout, bilaterally Deep Tendon Reflexes: 2+ and symmetric biceps and patella Plantars: Right: downgoing   Left: downgoing Cerebellar: Lack of cooperation/ drowsy Gait: deferred   Lab Results: Basic Metabolic Panel: Recent Labs  Lab 11/10/17 0427  11/12/17 0808 11/13/17 0824 11/13/17 2056 11/14/17 0656 11/14/17 1045  NA 135   < > 132* 135 136 138 138  K 4.2   < > 3.2* 3.0* 3.6 3.6 3.7  CL 102   < > 83* 93* 97* 97* 96*  CO2 19*   < > 36* 30 30 27 26   GLUCOSE 389*   < > 202* 130* 105* 80 81  BUN 80*   < > 68* 39* 21 23 23   CREATININE 10.78*   < > 9.30* 6.55* 3.75* 4.57* 4.81*  CALCIUM 6.6*   < > 5.5* 5.9* 6.5* 7.1* 7.4*  PHOS 4.9*  --  4.6 3.5  --  3.6  --    < > = values in this interval not displayed.    CBC: Recent Labs  Lab 11/08/17 1900 11/09/17 0438 11/12/17 0535 11/14/17 0656 11/14/17 1045  WBC 15.8* 15.6* 12.1* 11.9* 12.5*  NEUTROABS 12.5*  --   --   --   --   HGB 10.1* 10.2* 8.4* 7.6* 7.8*  HCT 32.1* 32.5* 26.0* 24.1* 25.2*  MCV 88.9 89.5 85.0 88.6 88.7  PLT 533* 524* 388 259 282  Cardiac Enzymes: Recent Labs  Lab 11/14/17 1045  TROPONINI <0.03    Imaging: Ct Head Code Stroke Wo Contrast  Result Date:  11/14/2017 CLINICAL DATA:  Code stroke. LEFT-sided weakness and facial droop with drooling. EXAM: CT HEAD WITHOUT CONTRAST TECHNIQUE: Contiguous axial images were obtained from the base of the skull through the vertex without intravenous contrast. COMPARISON:  09/11/2013. FINDINGS: Brain: No evidence for acute infarction, hemorrhage, mass lesion, hydrocephalus, or extra-axial fluid. Generalized atrophy, premature for age. Hypoattenuation of white matter consistent with small vessel disease. Vascular: Calcification of the cavernous internal carotid arteries consistent with cerebrovascular atherosclerotic disease. No signs of intracranial large vessel occlusion. Skull: Calvarium intact. Sinuses/Orbits: No sinus disease.  Negative orbits. Other: None. ASPECTS [facility] Stroke Program Early CT Score) - Ganglionic level infarction (caudate, lentiform nuclei, internal capsule, insula, M1-M3 cortex): 7 - Supraganglionic infarction (M4-M6 cortex): 3 Total score (0-10 with 10 being normal): 10 IMPRESSION: 1. Atrophy and small vessel disease, mildly progressed from 2015. No acute intracranial findings. No signs of large vessel occlusion. 2. ASPECTS is 10. 3. A text page was placed to the ordering provider at 11:17 a.m. Electronically Signed   By: Staci Righter M.D.   On: 11/14/2017 11:23   Laurey Morale, MSN, NP-C [facility] [phone]   Assessment: 62 year old female with breakthrough seizure after starting dialysis for ESRD 1. ESRD. Has just started dialysis. Dialysis dysequilibrium syndrome versus rapid removal of serum Keppra by dialysis is on the DDx for her breakthrough seizure.  2. Transaminases are unremarkable.  3. CT head no acute findings. Atrophy and small vessel disease that has slightly progressed from 2015.  Recommendations: 1. Continue Keppra at a total of 1000 mg IV qd. Will change from BID 500 to qd 1000 as with ESRD on dialysis patients, QD dosing is indicated due to impaired  renal clearance (ordered) 2. Adding supplemental dose of 500 mg IV Keppra following dialysis on dialysis days (ordered) 3. Towards a goal of coverage in the event that [place] needs to be discontinued due to her now being on dialysis, and to continue coverage initiated at the [facility] ED, will start scheduled Dilantin at 100 mg TID IV (hepatically metabolized).  4. EEG in the AM  I have seen and examined the patient. I have amended the exam, assessment and recommendations above.  Electronically signed: Dr. Kerney Elbe

## 2017-11-14 NOTE — Progress Notes (Signed)
Pt has facial twitching. MD has been E-Paged.

## 2017-11-14 NOTE — Progress Notes (Signed)
Additional note to Code Stroke.   Exam delay also due to patient having to be sedated to stop movement in order to obtain CT scan.   This was done prior to patient being brought to CT.

## 2017-11-14 NOTE — Progress Notes (Addendum)
Report given to Harmon Dun, [address]. All questions were answered and no further questions at this time.

## 2017-11-14 NOTE — Progress Notes (Signed)
PT calcium critically low. MD paged. EKG ordered and normal sinus rhythm. Labs to be drawn. Continue to monitor.

## 2017-11-14 NOTE — Progress Notes (Signed)
Pt CBG 80.

## 2017-11-14 NOTE — Progress Notes (Addendum)
PROGRESS NOTE  Erica [name] QJF:[id number] DOB: 12-26-1955 DOA: 11/08/2017 PCP: Octavio Graves, DO  Brief History: 62 year old female with a history of diabetes mellitus, hypertension, seizure disorder presenting with generalized weakness x2 to 3 dayswith confusion.The patient was also complaining of lower abd pain with dysuria.The patient was recently hospitalized at [facility] from 10/07/2017 through 10/21/2017 during which she was treated for emphysematous pyelonephritis and acute on chronic renal failure. She also was treated for DKA and fluid overload.She was noted to have hemoglobin A1c of 12.1. She required transfusion 2 u PRBC without evidence of active bleeding.She underwent bilateral ureteral stenting on 10/07/2017. She was treated appropriately with antibiotics. She was discharged with instructions for outpatient cystoscopy and removal of stents in 2 to 3 weeks. Upon thisadmission, the patient has serum creatinine of 8.1. At the time of discharge, her serum creatinine has stabilized between 3.7-3.9.Nephrology was consulted to assist with management. Urology was also consulted due to retained stents--they are currently planning to remove stents on 11/16/17.  On the morning of 11/14/17, the patient was noted to have left facial twitching.  Around 1020 am during nursing assessment, the patient was then noted to have left arm and left leg twitching with associated dysarthria.  At that time, the patient could not lift up her left arm or left leg.  Code Stroke was called.  The patient was given ativan 2 mg IV with improvement of her movements, but still was not able to move her left arm or left leg antigravity.  As a result, the pt will be transferred to [facility] for MRI brain and neurology consultation.  Case was discussed with Dr. Cheral Marker.  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 Festus Aloe temporary HD catheter to initiate HD -ultimately will need Perm-Cath--defer timing to nephrology -Received HD 9/27 and 9/28 -[facility]rology at [facility] consulted, spoke with Dr. Justin Mend  Pyelonephritis -11/08/17 urine culture = Ecoli -continue ceftriaxone -11/08/2017 CT renal protocol--bilateral ureteral stents without hydronephrosis. Small bilateral renal calculi. Small amount of ascites with anasarca. Thickened stomach may be related to ascites. -ureteral stents initially placed 10/07/17 -11/12/17--case discussed with urology, Dr. Macario Golds plan for stent removal 11/16/17 if pt still in hospital.  -please reconsult urology after transfer to [facility]  Seizure disorder/Left Hemiparesis -continued IV keppra  -11/14/17 am--left facial twitching with left arm and left leg myoclonic activity -left hemiparesis noted at 1020 am on 11/14/17 -ativan 2 mg IV x 1 -pt continued to have left hemiparesis after ativan -transfer to [place] for neurology consult and MRI brain as these services not available presently -discussed with neurology, Dr. Cheral Marker -11/14/17 STAT CT brain--neg for acute findings -?Todd's paralysis vs acute stroke  Diabetes mellitus type 2, uncontrolled with hyperglycemia -d/c lantus as pt remains hypoglycemic with reduced dose -NovoLog sliding scale -10/07/17 A1C = 12.1  Essential hypertension -Continue metoprolol succinate>>>IV lopressor as pt had been refusing pills -Holding amlodipine secondary to soft blood pressure -d/c lopressor now due to soft BPs  Metabolic acidosis -Secondary to acute on chronic renal failure -initially on bicarbonate drip per nephrology -now improved on HD  Diarrhea -6 loose BMs in past 24 hours -check Cdiff--neg  Severe  malnutrition -continue supplements  Hypocalcemia -pt given IV calcium gluconate 9/26, 9/27, 9/28 -corrected calcium 8.7 on  9/29    Disposition Plan: Transfer to [place] Communication:Son updated on phone 11/14/17   Consultants:nephrology, urology  Code Status: FULL  DVT Prophylaxis: SCDs   Procedures: As Listed in Progress Note Above  Antibiotics: Ceftriaxone 9/23>>>  The patient is critically ill with multiple organ systems failure and requires high complexity decision making for assessment and support, frequent evaluation and titration of therapies, application of advanced monitoring technologies and extensive interpretation of multiple databases.  Critical care time - 40 mins.    Subjective: Pt is awake and alert, but dysarthric.  Patient denies fevers, chills, headache, chest pain, dyspnea, nausea, vomiting, abdominal pain, dysuria, hematuria, hematochezia, and melena.   Objective: Vitals:   11/13/17 2230 11/14/17 0633 11/14/17 1014 11/14/17 1024  BP: 130/68 (!) 148/81 (!) 87/27 (!) 90/50  Pulse: 95 (!) 102 (!) 109 (!) 112  Resp: 16 16 18    Temp: 97.8 F (36.6 C) 98.2 F (36.8 C) 99 F (37.2 C)   TempSrc: Oral Oral Oral   SpO2: 99% 100% 97%   Weight: 50 kg     Height:        Intake/Output Summary (Last 24 hours) at 11/14/2017 1055 Last data filed at 11/14/2017 0900 Gross per 24 hour  Intake 506.48 ml  Output 1145 ml  Net -638.52 ml   Weight change: 0.7 kg Exam:   General:  Pt is alert, follows commands appropriately, not in acute distress  HEENT: No icterus, No thrush, No neck mass, [place]/AT  Cardiovascular: RRR, S1/S2, no rubs, no gallops  Respiratory: fine basilar crackles, no wheeze  Abdomen: Soft/+BS, non tender, non distended, no guarding  Extremities: No edema, No lymphangitis, No petechiae, No rashes, no synovitis  Neuro:  CN II-XII intact, strength 4/5 in RUE, 2/5 RLE, strength 4/5 LUE,  2/5 on LLE; sensation  decreased bilateral;  babinski equivocal     Data Reviewed: I have personally reviewed following labs and imaging studies Basic Metabolic Panel: Recent Labs  Lab 11/10/17 0427 11/11/17 1100 11/12/17 0808 11/13/17 0824 11/13/17 2056 11/14/17 0656  NA 135 133* 132* 135 136 138  K 4.2 3.1* 3.2* 3.0* 3.6 3.6  CL 102 90* 83* 93* 97* 97*  CO2 19* 31 36* 30 30 27   GLUCOSE 389* 142* 202* 130* 105* 80  BUN 80* 77* 68* 39* 21 23  CREATININE 10.78* 9.67* 9.30* 6.55* 3.75* 4.57*  CALCIUM 6.6* 5.7* 5.5* 5.9* 6.5* 7.1*  PHOS 4.9*  --  4.6 3.5  --  3.6   Liver Function Tests: Recent Labs  Lab 11/08/17 1900 11/10/17 0427 11/12/17 0808 11/13/17 0824 11/14/17 0656  AST 12*  --   --   --   --   ALT 8  --   --   --   --   ALKPHOS 90  --   --   --   --   BILITOT 0.5  --   --   --   --   PROT 7.8  --   --   --   --   ALBUMIN 2.4* 1.7* 1.8* 1.7* 2.0*   No results for input(s): LIPASE, AMYLASE in the last 168 hours. No results for input(s): AMMONIA in the last 168 hours. Coagulation Profile: No results for input(s): INR, PROTIME in the last 168 hours. CBC: Recent Labs  Lab 11/08/17 1900 11/09/17 0438 11/12/17 0535 11/14/17 0656  WBC 15.8* 15.6* 12.1* 11.9*  NEUTROABS 12.5*  --   --   --   HGB 10.1*  10.2* 8.4* 7.6*  HCT 32.1* 32.5* 26.0* 24.1*  MCV 88.9 89.5 85.0 88.6  PLT 533* 524* 388 259   Cardiac Enzymes: No results for input(s): CKTOTAL, CKMB, CKMBINDEX, TROPONINI in the last 168 hours. BNP: Invalid input(s): POCBNP CBG: Recent Labs  Lab 11/13/17 1127 11/13/17 1623 11/13/17 2213 11/14/17 0734 11/14/17 1020  GLUCAP 139* 99 90 71 80   HbA1C: No results for input(s): HGBA1C in the last 72 hours. Urine analysis:    Component Value Date/Time   COLORURINE YELLOW 11/08/2017 2007   APPEARANCEUR TURBID (A) 11/08/2017 2007   LABSPEC 1.009 11/08/2017 2007   PHURINE 6.0 11/08/2017 2007   GLUCOSEU NEGATIVE 11/08/2017 2007   HGBUR MODERATE (A) 11/08/2017 2007    BILIRUBINUR NEGATIVE 11/08/2017 2007   KETONESUR NEGATIVE 11/08/2017 2007   PROTEINUR 100 (A) 11/08/2017 2007   UROBILINOGEN 0.2 07/25/2014 2309   NITRITE NEGATIVE 11/08/2017 2007   LEUKOCYTESUR MODERATE (A) 11/08/2017 2007   Sepsis Labs: @LABRCNTIP (procalcitonin:4,lacticidven:4) ) Recent Results (from the past 240 hour(s))  Urine culture     Status: Abnormal   Collection Time: 11/08/17  8:07 PM  Result Value Ref Range Status   Specimen Description   Final    URINE, RANDOM Performed at [facility], [address], [place], [place] [address]    Special Requests   Final    NONE Performed at [facility], [address], [place], [place] [address]    Culture >=100,000 COLONIES/mL ESCHERICHIA COLI (A)  Final   Report Status 11/11/2017 FINAL  Final   Organism ID, Bacteria ESCHERICHIA COLI (A)  Final      Susceptibility   Escherichia coli - MIC*    AMPICILLIN >=32 RESISTANT Resistant     CEFAZOLIN <=4 SENSITIVE Sensitive     CEFTRIAXONE <=1 SENSITIVE Sensitive     CIPROFLOXACIN <=0.25 SENSITIVE Sensitive     GENTAMICIN <=1 SENSITIVE Sensitive     IMIPENEM <=0.25 SENSITIVE Sensitive     NITROFURANTOIN <=16 SENSITIVE Sensitive     TRIMETH/SULFA <=20 SENSITIVE Sensitive     AMPICILLIN/SULBACTAM 16 INTERMEDIATE Intermediate     PIP/TAZO <=4 SENSITIVE Sensitive     Extended ESBL NEGATIVE Sensitive     * >=100,000 COLONIES/mL ESCHERICHIA COLI  C difficile quick scan w PCR reflex     Status: None   Collection Time: 11/13/17 11:17 PM  Result Value Ref Range Status   C Diff antigen NEGATIVE NEGATIVE Final   C Diff toxin NEGATIVE NEGATIVE Final   C Diff interpretation No C. difficile detected.  Final    Comment: VALID Performed at [facility], [address], [place], [place] [address]      Scheduled Meds: . Chlorhexidine Gluconate Cloth  6 each Topical Q0600  . epoetin (EPOGEN/PROCRIT) injection  4,000 Units Subcutaneous Q T,Th,Sa-HD  . feeding supplement (GLUCERNA SHAKE)  237 mL  Oral BID BM  . insulin aspart  0-9 Units Subcutaneous TID WC  . LORazepam  2 mg Intravenous Once  . metoprolol tartrate  2.5 mg Intravenous Q12H  . tuberculin  5 Units Intradermal Once   Continuous Infusions: . sodium chloride Stopped (11/11/17 1911)  . sodium chloride    . sodium chloride    . sodium chloride    . sodium chloride    . sodium chloride    . cefTRIAXone (ROCEPHIN)  IV Stopped (11/13/17 2351)  . levETIRAcetam Stopped (11/13/17 2317)    Procedures/Studies: US Renal  Result Date: 11/09/2017 CLINICAL DATA:  62 year old female with chronic kidney disease  stage 3. Subsequent encounter. EXAM: RENAL / URINARY TRACT ULTRASOUND COMPLETE COMPARISON:  11/08/2017 CT. FINDINGS: Right Kidney: Length: 10.1 cm. Minimal increased echogenicity. No hydronephrosis or mass. Left Kidney: Length: 10.5 cm. Echogenicity within normal limits. No mass or hydronephrosis visualized. Bladder: Debris within the bladder.  Bilateral ureteral stents in place. IMPRESSION: 1. Bilateral ureteral stents in place.  No hydronephrosis. 2. Debris within the urinary bladder. 3. Slight increased echogenicity right renal parenchyma, may reflect changes of medical renal disease. Electronically Signed   By: Genia Del M.D.   On: 11/09/2017 14:54   Dg Chest Port 1 View  Result Date: 11/09/2017 CLINICAL DATA:  PICC line placement EXAM: PORTABLE CHEST 1 VIEW COMPARISON:  10/06/2017 FINDINGS: Left internal jugular PICC line is been placed with the tip at the cavoatrial junction. No pneumothorax. Heart is normal size. Lungs clear. No effusions or acute bony abnormality. IMPRESSION: Left PICC line tip at the cavoatrial junction.  No pneumothorax. Electronically Signed   By: Rolm Baptise M.D.   On: 11/09/2017 12:58   Ct Renal Stone Study  Result Date: 11/08/2017 CLINICAL DATA:  62 year old female with UTI. History of kidney stones. Displacement of indwelling urethral stent. EXAM: CT ABDOMEN AND PELVIS WITHOUT CONTRAST  TECHNIQUE: Multidetector CT imaging of the abdomen and pelvis was performed following the standard protocol without IV contrast. COMPARISON:  CT of the abdomen pelvis dated 12/07/2012 FINDINGS: Evaluation of this exam is limited in the absence of intravenous contrast. Lower chest: Bibasilar linear atelectasis/scarring. There is calcification of the mitral annulus. There is hypoattenuation of the cardiac blood pool suggestive of a degree of anemia. Clinical correlation is recommended. No intra-abdominal free air. Small ascites and diffuse mesenteric edema. Hepatobiliary: The liver is unremarkable. There is mild intrahepatic biliary ductal dilatation as seen on the prior CT. The gallbladder is not visualized, likely surgically absent. Pancreas: The pancreas is atrophic with coarse calcifications likely sequela of chronic pancreatitis. Spleen: Normal in size without focal abnormality. Adrenals/Urinary Tract: The adrenal glands are unremarkable. Small faint hypodensities within the renal collecting systems on likely represent small nonobstructing calculi versus residual excreted contrast from recent IV administration. Clinical correlation is recommended. Bilateral pigtail ureteral stents noted. The proximal tip of the right ureteral stent is in the right renal upper pole collecting system and the proximal tip of the left ureteral stent is in the left renal pelvis. The distal ends of the stents are within the urinary bladder. There is no hydronephrosis on either side. Stomach/Bowel: There is diffuse thickened appearance of the wall of the stomach as well as thickened appearance of the colon, likely related to ascites. Gastritis or colitis are considered less likely. Clinical correlation is recommended. There is no bowel obstruction. The appendix is normal. Vascular/Lymphatic: The abdominal aorta and IVC are grossly unremarkable on this noncontrast CT. No portal venous gas. Top-normal left retroperitoneal lymph nodes.  Reproductive: Vascular calcification of the peripheral uterus noted. No pelvic mass. Other: Diffuse subcutaneous edema and anasarca. Musculoskeletal: There is degenerative changes of the spine. Grade 1 L4-L5 anterolisthesis. There is compression fracture of the superior endplate of the L2, age indeterminate but new since the prior CT of 2014. Compression fracture of the inferior endplate of D53 and G99. The T12 compression fracture is new since the prior CT. There is no retropulsed fragment. IMPRESSION: 1. Bilateral ureteral stents in place as described. No hydronephrosis. 2. Probable small bilateral renal calculi versus residual contrast from recent study. 3. Small ascites, diffuse edema, and anasarca of indeterminate  etiology. Clinical correlation is recommended. 4. Thickened stomach and colon, likely related to ascites. Electronically Signed   By: Anner Crete M.D.   On: 11/08/2017 21:41    Orson Eva, DO  [facility] Pager [phone]  If 7PM-7AM, please contact night-coverage [email or web address] Password [email or web address] 11/14/2017, 10:55 AM   LOS: 6 days

## 2017-11-14 NOTE — Progress Notes (Signed)
[facility] arrived to transport patient. Patient appears obtunded. MD is currently in room assessing patient.  Vital signs are as follows Pulse 105 Respiration 14 O2 98% B/P 136/83  Continuing to monitor patient

## 2017-11-14 NOTE — Progress Notes (Signed)
Dextrose 50% solution 25 mL Intravenous once administered as ordered.

## 2017-11-14 NOTE — Progress Notes (Signed)
Lorazepam (Ativan) injection 2 mg administered per order.

## 2017-11-15 ENCOUNTER — Inpatient Hospital Stay (HOSPITAL_COMMUNITY): Payer: Medicaid Other

## 2017-11-15 DIAGNOSIS — N179 Acute kidney failure, unspecified: Secondary | ICD-10-CM

## 2017-11-15 LAB — RENAL FUNCTION PANEL
ANION GAP: 12 (ref 5–15)
Albumin: 1.9 g/dL — ABNORMAL LOW (ref 3.5–5.0)
BUN: 23 mg/dL (ref 8–23)
CHLORIDE: 103 mmol/L (ref 98–111)
CO2: 24 mmol/L (ref 22–32)
Calcium: 7.6 mg/dL — ABNORMAL LOW (ref 8.9–10.3)
Creatinine, Ser: 5.08 mg/dL — ABNORMAL HIGH (ref 0.44–1.00)
GFR, EST AFRICAN AMERICAN: 10 mL/min — AB (ref >60)
GFR, EST NON AFRICAN AMERICAN: 8 mL/min — AB (ref >60)
Glucose, Bld: 93 mg/dL (ref 70–99)
POTASSIUM: 3.7 mmol/L (ref 3.5–5.1)
Phosphorus: 4.4 mg/dL (ref 2.5–4.6)
Sodium: 139 mmol/L (ref 135–145)

## 2017-11-15 LAB — COMPREHENSIVE METABOLIC PANEL
ALT: 12 U/L (ref 0–44)
AST: 24 U/L (ref 15–41)
Albumin: 1.9 g/dL — ABNORMAL LOW (ref 3.5–5.0)
Alkaline Phosphatase: 69 U/L (ref 38–126)
Anion gap: 14 (ref 5–15)
BUN: 23 mg/dL (ref 8–23)
CHLORIDE: 101 mmol/L (ref 98–111)
CO2: 22 mmol/L (ref 22–32)
CREATININE: 5.06 mg/dL — AB (ref 0.44–1.00)
Calcium: 7.4 mg/dL — ABNORMAL LOW (ref 8.9–10.3)
GFR calc Af Amer: 10 mL/min — ABNORMAL LOW (ref >60)
GFR, EST NON AFRICAN AMERICAN: 8 mL/min — AB (ref >60)
Glucose, Bld: 88 mg/dL (ref 70–99)
POTASSIUM: 3.6 mmol/L (ref 3.5–5.1)
Sodium: 137 mmol/L (ref 135–145)
TOTAL PROTEIN: 5.8 g/dL — AB (ref 6.5–8.1)
Total Bilirubin: 0.9 mg/dL (ref 0.3–1.2)

## 2017-11-15 LAB — CBC
HEMATOCRIT: 25.9 % — AB (ref 36.0–46.0)
HEMOGLOBIN: 7.7 g/dL — AB (ref 12.0–15.0)
MCH: 27.3 pg (ref 26.0–34.0)
MCHC: 29.7 g/dL — ABNORMAL LOW (ref 30.0–36.0)
MCV: 91.8 fL (ref 78.0–100.0)
PLATELETS: 242 10*3/uL (ref 150–400)
RBC: 2.82 MIL/uL — AB (ref 3.87–5.11)
RDW: 16.4 % — ABNORMAL HIGH (ref 11.5–15.5)
WBC: 10.3 10*3/uL (ref 4.0–10.5)

## 2017-11-15 LAB — GLUCOSE, CAPILLARY
GLUCOSE-CAPILLARY: 121 mg/dL — AB (ref 70–99)
GLUCOSE-CAPILLARY: 85 mg/dL (ref 70–99)
GLUCOSE-CAPILLARY: 86 mg/dL (ref 70–99)
GLUCOSE-CAPILLARY: 109 mg/dL — AB (ref 70–99)
Glucose-Capillary: 202 mg/dL — ABNORMAL HIGH (ref 70–99)

## 2017-11-15 LAB — HEPATITIS B SURFACE ANTIBODY, QUANTITATIVE: Hepatitis B-Post: <3.1 m[IU]/mL — ABNORMAL LOW (ref >9.9)

## 2017-11-15 LAB — OCCULT BLOOD X 1 CARD TO LAB, STOOL: FECAL OCCULT BLD: POSITIVE — AB

## 2017-11-15 LAB — IRON AND TIBC
Iron: 47 ug/dL (ref 28–170)
SATURATION RATIOS: 29 % (ref 10.4–31.8)
TIBC: 160 ug/dL — ABNORMAL LOW (ref 250–450)
UIBC: 113 ug/dL

## 2017-11-15 LAB — MRSA PCR SCREENING: MRSA BY PCR: NEGATIVE

## 2017-11-15 LAB — PHENYTOIN LEVEL, TOTAL: Phenytoin Lvl: 12.4 ug/mL (ref 10.0–20.0)

## 2017-11-15 LAB — FERRITIN: FERRITIN: 383 ng/mL — AB (ref 11–307)

## 2017-11-15 LAB — HEPATITIS B CORE ANTIBODY, TOTAL: Hep B Core Total Ab: NEGATIVE

## 2017-11-15 MED ORDER — LORAZEPAM 2 MG/ML IJ SOLN
1.0000 mg | Freq: Once | INTRAMUSCULAR | Status: AC
  Administered 2017-11-15: 1 mg via INTRAVENOUS
  Filled 2017-11-15: qty 1

## 2017-11-15 MED ORDER — LOPERAMIDE HCL 2 MG PO CAPS
2.0000 mg | ORAL_CAPSULE | Freq: Two times a day (BID) | ORAL | Status: AC
  Administered 2017-11-15: 2 mg via ORAL
  Filled 2017-11-15: qty 1

## 2017-11-15 MED ORDER — NEPRO/CARBSTEADY PO LIQD
237.0000 mL | Freq: Two times a day (BID) | ORAL | Status: DC
  Administered 2017-11-16 – 2017-11-29 (×23): 237 mL via ORAL
  Filled 2017-11-15 (×29): qty 237

## 2017-11-15 MED ORDER — HEPARIN SODIUM (PORCINE) 5000 UNIT/ML IJ SOLN
5000.0000 [IU] | Freq: Three times a day (TID) | INTRAMUSCULAR | Status: AC
  Administered 2017-11-15 – 2017-11-21 (×13): 5000 [IU] via SUBCUTANEOUS
  Filled 2017-11-15 (×15): qty 1

## 2017-11-15 NOTE — Progress Notes (Addendum)
[facility] Progress Note   Subjective:   Has no complaints this morning.  Oriented to person, [place] and does not know anything that's occurred in the past several days.   Objective Vitals:   11/14/17 1511 11/14/17 1930 11/15/17 0011 11/15/17 0326  BP: 130/74 137/81 (!) 154/84 (!) 157/86  Pulse: 99 98 92 91  Resp: 18 20 18 18   Temp: 97.7 F (36.5 C) 97.6 F (36.4 C) 97.8 F (36.6 C) 98 F (36.7 C)  TempSrc: Oral Axillary Axillary Axillary  SpO2: 100%  100% 98%  Weight:      Height:       Physical Exam General: lying flat in bed awake and comfortable Heart: RRR, no rub Lungs: normal WOB, clear anteriorly Abdomen: soft, nontender Extremities: 1+ LE edema GU: no foley, has pad to collect urine.  Dialysis Access:  Femoral HD catheter - temporary  Additional Objective Labs: Basic Metabolic Panel: Recent Labs  Lab 11/13/17 0824  11/14/17 0656 11/14/17 1045 11/14/17 1615  NA 135   < > 138 138 135  K 3.0*   < > 3.6 3.7 3.6  CL 93*   < > 97* 96* 99  CO2 30   < > 27 26 23   GLUCOSE 130*   < > 80 81 125*  BUN 39*   < > 23 23 22   CREATININE 6.55*   < > 4.57* 4.81* 4.81*  CALCIUM 5.9*   < > 7.1* 7.4* 7.0*  PHOS 3.5  --  3.6  --  4.4   < > = values in this interval not displayed.   Liver Function Tests: Recent Labs  Lab 11/08/17 1900  11/13/17 0824 11/14/17 0656 11/14/17 1615  AST 12*  --   --   --   --   ALT 8  --   --   --   --   ALKPHOS 90  --   --   --   --   BILITOT 0.5  --   --   --   --   PROT 7.8  --   --   --   --   ALBUMIN 2.4*   < > 1.7* 2.0* 1.8*   < > = values in this interval not displayed.   No results for input(s): LIPASE, AMYLASE in the last 168 hours. CBC: Recent Labs  Lab 11/08/17 1900 11/09/17 0438 11/12/17 0535 11/14/17 0656 11/14/17 1045 11/14/17 1615  WBC 15.8* 15.6* 12.1* 11.9* 12.5* 10.7*  NEUTROABS 12.5*  --   --   --   --   --   HGB 10.1* 10.2* 8.4* 7.6* 7.8* 7.0*  HCT 32.1* 32.5* 26.0*  24.1* 25.2* 22.9*  MCV 88.9 89.5 85.0 88.6 88.7 89.8  PLT 533* 524* 388 259 282 258   Blood Culture    Component Value Date/Time   SDES  11/08/2017 2007    URINE, RANDOM Performed at [facility], [address] [place], [place] [address]    [place]  11/08/2017 2007    NONE Performed at [facility], [address] [place], [place] [address]    CULT >=100,000 COLONIES/mL ESCHERICHIA COLI (A) 11/08/2017 2007   REPTSTATUS 11/11/2017 FINAL 11/08/2017 2007    Cardiac Enzymes: Recent Labs  Lab 11/14/17 1045  TROPONINI <0.03   CBG: Recent Labs  Lab 11/14/17 0734 11/14/17 1020 11/14/17 1359 11/14/17 2133 11/15/17 0628  GLUCAP 71 80 80 97 85   Iron Studies: No results for input(s):  IRON, TIBC, TRANSFERRIN, FERRITIN in the last 72 hours. @lablastinr3 @ Studies/Results: Ct Head Code Stroke Wo Contrast  Result Date: 11/14/2017 CLINICAL DATA:  Code stroke. LEFT-sided weakness and facial droop with drooling. EXAM: CT HEAD WITHOUT CONTRAST TECHNIQUE: Contiguous axial images were obtained from the base of the skull through the vertex without intravenous contrast. COMPARISON:  09/11/2013. FINDINGS: Brain: No evidence for acute infarction, hemorrhage, mass lesion, hydrocephalus, or extra-axial fluid. Generalized atrophy, premature for age. Hypoattenuation of white matter consistent with small vessel disease. Vascular: Calcification of the cavernous internal carotid arteries consistent with cerebrovascular atherosclerotic disease. No signs of intracranial large vessel occlusion. Skull: Calvarium intact. Sinuses/Orbits: No sinus disease.  Negative orbits. Other: None. ASPECTS [facility] Stroke Program Early CT Score) - Ganglionic level infarction (caudate, lentiform nuclei, internal capsule, insula, M1-M3 cortex): 7 - Supraganglionic infarction (M4-M6 cortex): 3 Total score (0-10 with 10 being normal): 10 IMPRESSION: 1. Atrophy and small vessel disease, mildly progressed from 2015. No acute  intracranial findings. No signs of large vessel occlusion. 2. ASPECTS is 10. 3. A text page was placed to the ordering provider at 11:17 a.m. Electronically Signed   By: Staci Righter M.D.   On: 11/14/2017 11:23   Medications: . sodium chloride Stopped (11/11/17 1911)  . sodium chloride    . sodium chloride    . sodium chloride    . sodium chloride    . sodium chloride    . sodium chloride    . sodium chloride 100 mL/hr at 11/15/17 0008  . cefTRIAXone (ROCEPHIN)  IV 2 g (11/15/17 0051)  . levETIRAcetam    . levETIRAcetam     . Chlorhexidine Gluconate Cloth  6 each Topical Q0600  . epoetin (EPOGEN/PROCRIT) injection  4,000 Units Subcutaneous Q T,Th,Sa-HD  . feeding supplement (GLUCERNA SHAKE)  237 mL Oral BID BM  . insulin aspart  0-9 Units Subcutaneous TID WC  . LORazepam  1 mg Intravenous Once  . phenytoin (DILANTIN) IV  100 mg Intravenous Q8H     Assessment/Plan:  1.  AKI on CKD:  Recent course with emphysematous pyelonephritis treated at [facility], urologic intervention double J stents placed.  During that admission Cr ~ 8 on presentation, down to 4 on discharge.  Represented 2 weeks later with Cr up to 11.  She was initially hydrated but due lack of improved had HD x 2 at [facility] prior to transfer here yesterday.  No UOP documented yesterday but does not have foley and appears likely incontinent with pad in place.  With recent GU infections (severe) I'm reluctant to place foley but will see if the suction canister is available on this floor. Labs pending for today.  Given her possible seizure occurred after having had dialysis I do not believe this was related to uremia or HD.   I will follow up her labs from today and made a decision regarding dialysis.    2.   HTN:   Reasonable control for now.  BP has ranged 130-150.  3.  Anemia:  Hb has trended down significantly from 10.1 on 9/23 to 7.0 today.  I have added on iron studies and hemoccult for today.  She is currently rx'd epo  4000 q dialysis but will need to continue to assess for need for ESA if dialysis no longer indicated.  This appears to be more of an acute process.  Hb was in 9-10 at [place] earlier this month.   4.  BMM: phos 4.4, corr ca normal (albumin 1.8).  5. Seizures:  H/o hypoglycemia induced seizures; neurology following here and dosing meds for dialysis at this time - will need to f/u if dialysis is no longer indicated.   6.  E coli UTI: Rocephin 2g daily since 9/26.  7.  DM: per primary.   Jannifer Hick MD 11/15/2017, 7:45 AM  [facility]  ADDENDUM:  Labs reviewed, no acute indications for dialysis but renal function has not improved.  Spoke with RN and will place purewick for UOP collection.  I suspect she will end up needing further dialysis this admission but will hold for today and check labs again tomorrow.

## 2017-11-15 NOTE — Progress Notes (Signed)
PROGRESS NOTE  KAJUANA SHAREEF QBH:[id number] DOB: 04-30-1955 DOA: 11/08/2017 PCP: Octavio Graves, DO  Brief History: 62 year old female with a history of diabetes mellitus, hypertension, seizure disorder presenting with generalized weakness x2 to 3 dayswith confusion.The patient was also complaining of lower abd pain with dysuria.The patient was recently hospitalized at [facility] from 10/07/2017 through 10/21/2017 during which she was treated for emphysematous pyelonephritis and acute on chronic renal failure. She also was treated for DKA and fluid overload.She was noted to have hemoglobin A1c of 12.1. She required transfusion 2 u PRBC without evidence of active bleeding.She underwent bilateral ureteral stenting on 10/07/2017. She was treated appropriately with antibiotics. She was discharged with instructions for outpatient cystoscopy and removal of stents in 2 to 3 weeks. Upon thisadmission, the patient has serum creatinine of 8.1. At the time of discharge, her serum creatinine has stabilized between 3.7-3.9.Nephrology was consulted to assist with management. Urology was also consulted due to retained stents--they are currently planning to remove stents .Marland Kitchen  Patient had seizures 11/14/2017, she was transferred to [facility] for further work-up .  Assessment/Plan: Acute on chronic renal failure--stage unspecified presently -etiology likely infection and hemodynamic changes/ATN -Treatment per renal, already received hemodialysis 9/27 and 9/28 at any point hospital. -Followed by renal at [facility], they are to determine if patient will need to continue dialysis or not. -He has right groin temporary hemodialysis catheter, depends if she becomes out of his dependent, then will have vascular consulted for permanent access.   Pyelonephritis -11/08/17 urine culture = Ecoli -continue ceftriaxone -11/08/2017 CT renal protocol--bilateral ureteral stents  without hydronephrosis. Small bilateral renal calculi. Small amount of ascites with anasarca. Thickened stomach may be related to ascites. -ureteral stents initially placed 10/07/17 -Discussed with Dr. Jeffie Pollock 11/15/2017, likely he will arrange for stent removal during hospital stay at [facility]  Seizure disorder/Left Hemiparesis -Appears to be new onset, started after hemodialysis session, management per neurology, EEG is pending, started on Keppra.  Diabetes mellitus type 2, uncontrolled with hyperglycemia -d/c lantus as pt remains hypoglycemic with reduced dose -NovoLog sliding scale -10/07/17 A1C = 12.1  Essential hypertension -Continue metoprolol succinate>>>IV lopressor as pt had been refusing pills -Holding amlodipine secondary to soft blood pressure -d/c lopressor now due to soft BPs  Metabolic acidosis -Secondary to acute on chronic renal failure -initially on bicarbonate drip per nephrology -now improved on HD  Diarrhea -6 loose BMs in past 24 hours -check Cdiff--neg -Has Flexi-Seal back, will start on Imodium  Severe malnutrition -continue supplements  Hypocalcemia -pt given IV calcium gluconate 9/26, 9/27, 9/28 -corrected calcium 8.7 on 9/29    Disposition Plan: [facility] to [facility] Communication:None at bedside   Consultants:nephrology, urology, neurology  Code Status: FULL  DVT Prophylaxis: SCDs, subcu heparin   Procedures: As Listed in Progress Note Above  Antibiotics: Ceftriaxone 9/23>>>   Subjective: Pt is awake and alert, has any fever, chills, nausea or vomiting or abdominal pain  Objective: Vitals:   11/15/17 0011 11/15/17 0326 11/15/17 0757 11/15/17 1135  BP: (!) 154/84 (!) 157/86 140/85 (!) 144/81  Pulse: 92 91 95 (!) 101  Resp: 18 18 20 20   Temp: 97.8 F (36.6 C) 98 F (36.7 C) (!) 97.5 F (36.4 C) (!) 97.5 F (36.4 C)  TempSrc: Axillary Axillary Oral Oral  SpO2: 100% 98% 100% 100%  Weight:       Height:        Intake/Output  Summary (Last 24 hours) at 11/15/2017 1137 Last data filed at 11/14/2017 1646 Gross per 24 hour  Intake 293.11 ml  Output -  Net 293.11 ml   Weight change:  Exam:  Awake Alert, x3 conversant , pleasant , has dried blood on temporary dialysis catheter, and left upper chest PICC line Good air entry bilaterally, no wheezing RRR,No Gallops,Rubs or new Murmurs, No Parasternal Heave +ve B.Sounds, Abd Soft, No tenderness, No rebound - guarding or rigidity. No Cyanosis, Clubbing or edema, No new Rash or bruise       Data Reviewed: I have personally reviewed following labs and imaging studies Basic Metabolic Panel: Recent Labs  Lab 11/12/17 0808 11/13/17 0824  11/14/17 0656 11/14/17 1045 11/14/17 1615 11/15/17 0807 11/15/17 0924  NA 132* 135   < > 138 138 135 139 137  K 3.2* 3.0*   < > 3.6 3.7 3.6 3.7 3.6  CL 83* 93*   < > 97* 96* 99 103 101  CO2 36* 30   < > 27 26 23 24 22   GLUCOSE 202* 130*   < > 80 81 125* 93 88  BUN 68* 39*   < > 23 23 22 23 23   CREATININE 9.30* 6.55*   < > 4.57* 4.81* 4.81* 5.08* 5.06*  CALCIUM 5.5* 5.9*   < > 7.1* 7.4* 7.0* 7.6* 7.4*  PHOS 4.6 3.5  --  3.6  --  4.4 4.4  --    < > = values in this interval not displayed.   Liver Function Tests: Recent Labs  Lab 11/08/17 1900  11/13/17 0824 11/14/17 0656 11/14/17 1615 11/15/17 0807 11/15/17 0924  AST 12*  --   --   --   --   --  24  ALT 8  --   --   --   --   --  12  ALKPHOS 90  --   --   --   --   --  69  BILITOT 0.5  --   --   --   --   --  0.9  PROT 7.8  --   --   --   --   --  5.8*  ALBUMIN 2.4*   < > 1.7* 2.0* 1.8* 1.9* 1.9*   < > = values in this interval not displayed.   No results for input(s): LIPASE, AMYLASE in the last 168 hours. No results for input(s): AMMONIA in the last 168 hours. Coagulation Profile: Recent Labs  Lab 11/14/17 1045  INR 1.47   CBC: Recent Labs  Lab 11/08/17 1900  11/12/17 0535 11/14/17 0656 11/14/17 1045 11/14/17 1615  11/15/17 0807  WBC 15.8*   < > 12.1* 11.9* 12.5* 10.7* 10.3  NEUTROABS 12.5*  --   --   --   --   --   --   HGB 10.1*   < > 8.4* 7.6* 7.8* 7.0* 7.7*  HCT 32.1*   < > 26.0* 24.1* 25.2* 22.9* 25.9*  MCV 88.9   < > 85.0 88.6 88.7 89.8 91.8  PLT 533*   < > 388 259 282 258 242   < > = values in this interval not displayed.   Cardiac Enzymes: Recent Labs  Lab 11/14/17 1045  TROPONINI <0.03   BNP: Invalid input(s): POCBNP CBG: Recent Labs  Lab 11/14/17 1020 11/14/17 1359 11/14/17 1423 11/14/17 2133 11/15/17 0628  GLUCAP 80 80 121* 97 85   HbA1C: No results for input(s): HGBA1C in the last  72 hours. Urine analysis:    Component Value Date/Time   COLORURINE YELLOW 11/08/2017 2007   APPEARANCEUR TURBID (A) 11/08/2017 2007   LABSPEC 1.009 11/08/2017 2007   PHURINE 6.0 11/08/2017 2007   GLUCOSEU NEGATIVE 11/08/2017 2007   HGBUR MODERATE (A) 11/08/2017 2007   BILIRUBINUR NEGATIVE 11/08/2017 2007   KETONESUR NEGATIVE 11/08/2017 2007   PROTEINUR 100 (A) 11/08/2017 2007   UROBILINOGEN 0.2 07/25/2014 2309   NITRITE NEGATIVE 11/08/2017 2007   LEUKOCYTESUR MODERATE (A) 11/08/2017 2007   Sepsis Labs: @LABRCNTIP (procalcitonin:4,lacticidven:4) ) Recent Results (from the past 240 hour(s))  Urine culture     Status: Abnormal   Collection Time: 11/08/17  8:07 PM  Result Value Ref Range Status   Specimen Description   Final    URINE, RANDOM Performed at [facility], [address] [place], [place] [address]    Special Requests   Final    NONE Performed at [facility], [address] [place], [place] [address]    Culture >=100,000 COLONIES/mL ESCHERICHIA COLI (A)  Final   Report Status 11/11/2017 FINAL  Final   Organism ID, Bacteria ESCHERICHIA COLI (A)  Final      Susceptibility   Escherichia coli - MIC*    AMPICILLIN >=32 RESISTANT Resistant     CEFAZOLIN <=4 SENSITIVE Sensitive     CEFTRIAXONE <=1 SENSITIVE Sensitive     CIPROFLOXACIN <=0.25 SENSITIVE Sensitive      GENTAMICIN <=1 SENSITIVE Sensitive     IMIPENEM <=0.25 SENSITIVE Sensitive     NITROFURANTOIN <=16 SENSITIVE Sensitive     TRIMETH/SULFA <=20 SENSITIVE Sensitive     AMPICILLIN/SULBACTAM 16 INTERMEDIATE Intermediate     PIP/TAZO <=4 SENSITIVE Sensitive     Extended ESBL NEGATIVE Sensitive     * >=100,000 COLONIES/mL ESCHERICHIA COLI  C difficile quick scan w PCR reflex     Status: None   Collection Time: 11/13/17 11:17 PM  Result Value Ref Range Status   C Diff antigen NEGATIVE NEGATIVE Final   C Diff toxin NEGATIVE NEGATIVE Final   C Diff interpretation No C. difficile detected.  Final    Comment: VALID Performed at [facility], [address], [place], [place] [address]   MRSA PCR Screening     Status: None   Collection Time: 11/14/17  5:26 AM  Result Value Ref Range Status   MRSA by PCR NEGATIVE NEGATIVE Final    Comment:        The GeneXpert MRSA Assay (FDA approved for NASAL specimens only), is one component of a comprehensive MRSA colonization surveillance program. It is not intended to diagnose MRSA infection nor to guide or monitor treatment for MRSA infections. Performed at [facility], [place] [address], [place],  [address]      Scheduled Meds: . Chlorhexidine Gluconate Cloth  6 each Topical Q0600  . epoetin (EPOGEN/PROCRIT) injection  4,000 Units Subcutaneous Q T,Th,Sa-HD  . feeding supplement (GLUCERNA SHAKE)  237 mL Oral BID BM  . insulin aspart  0-9 Units Subcutaneous TID WC  . LORazepam  1 mg Intravenous Once  . phenytoin (DILANTIN) IV  100 mg Intravenous Q8H   Continuous Infusions: . sodium chloride Stopped (11/11/17 1911)  . sodium chloride    . sodium chloride    . sodium chloride    . sodium chloride    . sodium chloride    . sodium chloride    . sodium chloride 100 mL/hr at 11/15/17 0008  . cefTRIAXone (ROCEPHIN)  IV 2 g (  11/15/17 0051)  . levETIRAcetam    . levETIRAcetam      Procedures/Studies: US Renal  Result Date:  December 01, 2017 CLINICAL DATA:  62 year old female with chronic kidney disease stage 3. Subsequent encounter. EXAM: RENAL / URINARY TRACT ULTRASOUND COMPLETE COMPARISON:  11/08/2017 CT. FINDINGS: Right Kidney: Length: 10.1 cm. Minimal increased echogenicity. No hydronephrosis or mass. Left Kidney: Length: 10.5 cm. Echogenicity within normal limits. No mass or hydronephrosis visualized. Bladder: Debris within the bladder.  Bilateral ureteral stents in place. IMPRESSION: 1. Bilateral ureteral stents in place.  No hydronephrosis. 2. Debris within the urinary bladder. 3. Slight increased echogenicity right renal parenchyma, may reflect changes of medical renal disease. Electronically Signed   By: Genia Del M.D.   On: 2017/12/01 14:54   Dg Chest Port 1 View  Result Date: 01-Dec-2017 CLINICAL DATA:  PICC line placement EXAM: PORTABLE CHEST 1 VIEW COMPARISON:  10/06/2017 FINDINGS: Left internal jugular PICC line is been placed with the tip at the cavoatrial junction. No pneumothorax. Heart is normal size. Lungs clear. No effusions or acute bony abnormality. IMPRESSION: Left PICC line tip at the cavoatrial junction.  No pneumothorax. Electronically Signed   By: Rolm Baptise M.D.   On: 2017/12/01 12:58   Ct Renal Stone Study  Result Date: 11/08/2017 CLINICAL DATA:  62 year old female with UTI. History of kidney stones. Displacement of indwelling urethral stent. EXAM: CT ABDOMEN AND PELVIS WITHOUT CONTRAST TECHNIQUE: Multidetector CT imaging of the abdomen and pelvis was performed following the standard protocol without IV contrast. COMPARISON:  CT of the abdomen pelvis dated 12/07/2012 FINDINGS: Evaluation of this exam is limited in the absence of intravenous contrast. Lower chest: Bibasilar linear atelectasis/scarring. There is calcification of the mitral annulus. There is hypoattenuation of the cardiac blood pool suggestive of a degree of anemia. Clinical correlation is recommended. No intra-abdominal free air. Small  ascites and diffuse mesenteric edema. Hepatobiliary: The liver is unremarkable. There is mild intrahepatic biliary ductal dilatation as seen on the prior CT. The gallbladder is not visualized, likely surgically absent. Pancreas: The pancreas is atrophic with coarse calcifications likely sequela of chronic pancreatitis. Spleen: Normal in size without focal abnormality. Adrenals/Urinary Tract: The adrenal glands are unremarkable. Small faint hypodensities within the renal collecting systems on likely represent small nonobstructing calculi versus residual excreted contrast from recent IV administration. Clinical correlation is recommended. Bilateral pigtail ureteral stents noted. The proximal tip of the right ureteral stent is in the right renal upper pole collecting system and the proximal tip of the left ureteral stent is in the left renal pelvis. The distal ends of the stents are within the urinary bladder. There is no hydronephrosis on either side. Stomach/Bowel: There is diffuse thickened appearance of the wall of the stomach as well as thickened appearance of the colon, likely related to ascites. Gastritis or colitis are considered less likely. Clinical correlation is recommended. There is no bowel obstruction. The appendix is normal. Vascular/Lymphatic: The abdominal aorta and IVC are grossly unremarkable on this noncontrast CT. No portal venous gas. Top-normal left retroperitoneal lymph nodes. Reproductive: Vascular calcification of the peripheral uterus noted. No pelvic mass. Other: Diffuse subcutaneous edema and anasarca. Musculoskeletal: There is degenerative changes of the spine. Grade 1 L4-L5 anterolisthesis. There is compression fracture of the superior endplate of the L2, age indeterminate but new since the prior CT of 2014. Compression fracture of the inferior endplate of L79 and G92. The T12 compression fracture is new since the prior CT. There is no retropulsed fragment. IMPRESSION:  1. Bilateral  ureteral stents in place as described. No hydronephrosis. 2. Probable small bilateral renal calculi versus residual contrast from recent study. 3. Small ascites, diffuse edema, and anasarca of indeterminate etiology. Clinical correlation is recommended. 4. Thickened stomach and colon, likely related to ascites. Electronically Signed   By: Anner Crete M.D.   On: 11/08/2017 21:41   Ct Head Code Stroke Wo Contrast  Result Date: 11/14/2017 CLINICAL DATA:  Code stroke. LEFT-sided weakness and facial droop with drooling. EXAM: CT HEAD WITHOUT CONTRAST TECHNIQUE: Contiguous axial images were obtained from the base of the skull through the vertex without intravenous contrast. COMPARISON:  09/11/2013. FINDINGS: Brain: No evidence for acute infarction, hemorrhage, mass lesion, hydrocephalus, or extra-axial fluid. Generalized atrophy, premature for age. Hypoattenuation of white matter consistent with small vessel disease. Vascular: Calcification of the cavernous internal carotid arteries consistent with cerebrovascular atherosclerotic disease. No signs of intracranial large vessel occlusion. Skull: Calvarium intact. Sinuses/Orbits: No sinus disease.  Negative orbits. Other: None. ASPECTS [facility] Stroke Program Early CT Score) - Ganglionic level infarction (caudate, lentiform nuclei, internal capsule, insula, M1-M3 cortex): 7 - Supraganglionic infarction (M4-M6 cortex): 3 Total score (0-10 with 10 being normal): 10 IMPRESSION: 1. Atrophy and small vessel disease, mildly progressed from 2015. No acute intracranial findings. No signs of large vessel occlusion. 2. ASPECTS is 10. 3. A text page was placed to the ordering provider at 11:17 a.m. Electronically Signed   By: Staci Righter M.D.   On: 11/14/2017 11:23    Phillips Climes, MD  [facility] Pager [phone]  If 7PM-7AM, please contact night-coverage [email or web address] Password [email or web address] 11/15/2017, 11:37 AM   LOS: 7 days

## 2017-11-15 NOTE — Procedures (Signed)
ELECTROENCEPHALOGRAM REPORT   Patient: Erica [name]       Room #: 3W31C EEG No. ID: [id number] Age: 62 y.o.        Sex: female Referring Physician: Elgergawy Report Date:  11/15/2017        Interpreting Physician: Alexis Goodell  History: Erica [name] is an 62 y.o. female with weakness and breakthrough seizures after dialysis  Medications:  Rocephin, Epogen, Insulin, Keppra, Dilantin  Conditions of Recording:  This is a 21 channel routine scalp EEG performed with bipolar and monopolar montages arranged in accordance to the international 10/20 system of electrode placement. One channel was dedicated to EKG recording.  The patient is in the awake and but confused state.  Description:  The background activity is discontinuous.  It consists of intermittent short periods of attenuation that have superimposed beta activity.  These periods of attenuation alternate with moderate voltage bursts of polymorphic delta activity that last from 2 to 6 seconds.  These bursts are seen over both hemispheres and are most prominent from the centroparietal regions bilaterally.  Despite being bilateral they are of higher voltage over the right centro-parietal region.     The patient has episodes of left sided jerking during the recording.  She is able to speak through these episodes.  There is no change in the aforementioned background during these periods of shaking.   Hyperventilation and intermittent photic stimulation were not performed.   IMPRESSION: This is an abnormal electroencephalogram due to a discontinuous slow rhythm with higher voltage over the right centro-parietal region.  Clinical correlation recommended.     Alexis Goodell, MD Neurology [phone] 11/15/2017, 1:23 PM

## 2017-11-15 NOTE — Progress Notes (Signed)
Nutrition Follow-up  DOCUMENTATION CODES:   Severe malnutrition in context of chronic illness  INTERVENTION:  Discontinue Glucerna Shake.   Provide Nepro Shake po BID, each supplement provides 425 kcal and 19 grams protein.  Encourage adequate PO intake.   NUTRITION DIAGNOSIS:   Severe Malnutrition related to chronic illness(declining renal fuction -Cr.10.8, BUN 80. Pending diaylsis) as evidenced by energy intake < or equal to 75% for > or equal to 1 month, moderate muscle depletion, severe muscle depletion, moderate fat depletion, per patient/family report; ongoing  GOAL:   Patient will meet greater than or equal to 90% of their needs; progressing  MONITOR:   PO intake, Supplement acceptance, Labs, Weight trends, I & O's  REASON FOR ASSESSMENT:   Malnutrition Screening Tool    ASSESSMENT:   Patient is a pleasant 62 yo female with hx of DM, HTN, Vitamin D deficiency, CHF and GERD. She presents with acute on chronic renal failure and urinary infection. Appetite is poor which is not surprising given her compromised renal function.  HD cath R groin. HD initiated 9/27. Pt with seizures 9/29 and was transferred to [facility] for further work up.   Pt reports having a good appetite and has been trying to consume most of her food at meals. Noted no recent recorded meal completion in flow sheets. Pt currently has Glucerna shake ordered. RD to order Nepro shake instead as pt has been initiated on HD. Plans for another HD session today. Labs and medications reviewed.   Diet Order:   Diet Order            Diet renal/carb modified with fluid restriction Diet-HS Snack? Nothing; Fluid restriction: 1200 mL Fluid; Room service appropriate? Yes; Fluid consistency: Thin  Diet effective now              EDUCATION NEEDS:   Education needs have been addressed  Skin:  Skin Assessment: Reviewed RN Assessment  Last BM:  9/29  Height:   Ht Readings from Last 1 Encounters:  11/08/17 5'  (1.524 m)    Weight:   Wt Readings from Last 1 Encounters:  11/13/17 50 kg    Ideal Body Weight:  45 kg  BMI:  Body mass index is 21.53 kg/m.  Estimated Nutritional Needs:   Kcal:  1650-1850  Protein:  75-85 grams  Fluid:  1.2 L/day    Corrin Parker, MS, RD, LDN Pager # [phone] After hours/ weekend pager # [phone]

## 2017-11-15 NOTE — Progress Notes (Signed)
Subjective: Patient is awake, alert able to follow commands and has no complaints at this time.  She does realize that her left leg is rhythmically twitching.  Exam: Vitals:   11/15/17 0326 11/15/17 0757  BP: (!) 157/86 140/85  Pulse: 91 95  Resp: 18 20  Temp: 98 F (36.7 C) (!) 97.5 F (36.4 C)  SpO2: 98% 100%    Physical Exam   HEENT-  Normocephalic, no lesions, without obvious abnormality.  Normal external eye and conjunctiva.    Extremities-throughout has decreased muscle mass Musculoskeletal-no joint tenderness, deformity or swelling Skin-warm and dry, no hyperpigmentation, vitiligo, or suspicious lesions    Neuro:  Mental Status: Alert, oriented to hospital but not year and believes that she is in [place].  Speech dysarthric without evidence of aphasia.  Able to follow simple commands without difficulty. Cranial Nerves: II: Tracks fingers left and right. III,IV, VI: ptosis not present, extra-ocular motions intact bilaterally pupils equal, round, reactive to light and accommodation V,VII: smile asymmetric however she is adentulous, facial light touch sensation normal bilaterally VIII: hearing normal bilaterally IX,X: uvula rises midline XI: bilateral shoulder shrug XII: midline tongue extension Motor: Able to hold upper extremities antigravity with no rhythmic jerking, able to hold right leg antigravity, able to hold left leg antigravity however does show rhythmic activity of low amplitude.  bulk decreased throughout Sensory: Pinprick and light touch intact throughout, bilaterally Deep Tendon Reflexes: Depressed throughout Plantars: Right: downgoing   Left: downgoing Cerebellar: normal finger-to-nose,     Medications:  Prior to Admission:  Medications Prior to Admission  Medication Sig Dispense Refill Last Dose  . albuterol (PROAIR HFA) 108 (90 BASE) MCG/ACT inhaler Inhale 2 puffs into the lungs every 6 (six) hours as needed for wheezing or shortness of  breath.   unknown  . amLODipine (NORVASC) 5 MG tablet Take 5 mg by mouth daily.   11/08/2017 at Unknown time  . cholecalciferol (VITAMIN D) 1000 units tablet Take 1,000 Units by mouth daily.   11/08/2017 at Unknown time  . esomeprazole (NEXIUM) 40 MG capsule Take 40 mg by mouth daily.    11/08/2017 at Unknown time  . hyoscyamine (ANASPAZ) 0.125 MG TBDP disintergrating tablet Take 0.125 mg by mouth 4 (four) times daily -  with meals and at bedtime.    11/08/2017 at Unknown time  . insulin aspart (NOVOLOG) 100 UNIT/ML FlexPen Inject 3 Units into the skin 3 (three) times daily with meals. (Patient taking differently: Inject 4-10 Units into the skin 3 (three) times daily with meals. Take 15 minutes before meals) 3 mL 0 11/08/2017 at Unknown time  . Insulin Degludec (TRESIBA FLEXTOUCH) 200 UNIT/ML SOPN Inject 10 Units into the skin daily with breakfast.    11/08/2017 at Unknown time  . levETIRAcetam (KEPPRA) 1000 MG tablet Take 1 tablet (1,000 mg total) by mouth 2 (two) times daily. 60 tablet 0 11/08/2017 at Unknown time  . linaclotide (LINZESS) 145 MCG CAPS capsule Take 145 mcg by mouth daily before breakfast.   11/08/2017 at Unknown time  . magnesium oxide (MAG-OX) 400 MG tablet Take 400 mg by mouth daily.   11/08/2017 at Unknown time  . metoCLOPramide (REGLAN) 5 MG tablet Take 5 mg by mouth 4 (four) times daily.   11/08/2017 at Unknown time  . metoprolol succinate (TOPROL-XL) 100 MG 24 hr tablet Take 100 mg by mouth daily. Take with or immediately following a meal.   11/08/2017 at 930a  . mirtazapine (REMERON) 15 MG tablet Take 15 mg  by mouth at bedtime.   11/07/2017 at Unknown time  . OxyCODONE HCl, Abuse Deter, (OXAYDO) 5 MG TABA Take 5 mg by mouth every 12 (twelve) hours as needed for pain.    11/08/2017 at morning  . PARoxetine (PAXIL) 20 MG tablet Take 20 mg by mouth daily.   11/08/2017 at Unknown time  . rOPINIRole (REQUIP) 1 MG tablet Take 2 mg by mouth daily. BETWEEN 7 PM AND 8 PM.    11/08/2017 at Unknown  time  . sodium bicarbonate 650 MG tablet Take 650 mg by mouth 4 (four) times daily.   11/08/2017 at Unknown time   Scheduled: . Chlorhexidine Gluconate Cloth  6 each Topical Q0600  . epoetin (EPOGEN/PROCRIT) injection  4,000 Units Subcutaneous Q T,Th,Sa-HD  . feeding supplement (GLUCERNA SHAKE)  237 mL Oral BID BM  . insulin aspart  0-9 Units Subcutaneous TID WC  . LORazepam  1 mg Intravenous Once  . phenytoin (DILANTIN) IV  100 mg Intravenous Q8H   Continuous: . sodium chloride Stopped (11/11/17 1911)  . sodium chloride    . sodium chloride    . sodium chloride    . sodium chloride    . sodium chloride    . sodium chloride    . sodium chloride 100 mL/hr at 11/15/17 0008  . cefTRIAXone (ROCEPHIN)  IV 2 g (11/15/17 0051)  . levETIRAcetam    . levETIRAcetam      Pertinent Labs/Diagnostics: -Creatinine 5.08 -Calcium 7.6 -Albumin 1.9 -Dilantin pending   Ct Head Code Stroke Wo Contrast  Result Date: 11/14/2017  IMPRESSION: 1. Atrophy and small vessel disease, mildly progressed from 2015. No acute intracranial findings. No signs of large vessel occlusion. 2. ASPECTS is 10. 3. A text page was placed to the ordering provider at 11:17 a.m. Electronically Signed   By: Staci Righter M.D.   On: 11/14/2017 11:23     Etta Quill PA-C [facility] [phone]   Assessment: 62 year old female with breakthrough seizure after starting dialysis.  At this point Arkoma has been changed to 1000 mg daily with 500 mg added after she has dialysis.  At this point waiting for Dilantin level.  At this point patient is showing simple partial continuous   Recommendations: -Obtain Dilantin level and make adjustments at that time - EEG has been ordered - Patient is to receive dialysis today - If Dilantin level is not therapeutic we will make adjustments on Dilantin if it is will make decision on adding a third agent.  Dr. Leonel Ramsay to follow   11/15/2017, 10:13 AM

## 2017-11-15 NOTE — Care Management Note (Signed)
Case Management Note  Patient Details  Name: Erica [name] MRN: [id number] Date of Birth: [birth year]  Subjective/Objective:  Pt in with confusion and renal failure. Pt is from home.  PCP:   Dr Melina Copa                Action/Plan: Awaiting MRI. CM following for d/c needs, physician orders.  Expected Discharge Date:  11/12/17               Expected Discharge Plan:     In-House Referral:     Discharge planning Services     Post Acute Care Choice:    Choice offered to:     DME Arranged:    DME Agency:     HH Arranged:    HH Agency:     Status of Service:  In process, will continue to follow  If discussed at Long Length of Stay Meetings, dates discussed:    Additional Comments:  Pollie Friar, RN 11/15/2017, 12:04 PM

## 2017-11-15 NOTE — Progress Notes (Signed)
EEG complete - results pending 

## 2017-11-16 DIAGNOSIS — I639 Cerebral infarction, unspecified: Secondary | ICD-10-CM

## 2017-11-16 LAB — GLUCOSE, CAPILLARY
GLUCOSE-CAPILLARY: 239 mg/dL — AB (ref 70–99)
Glucose-Capillary: 110 mg/dL — ABNORMAL HIGH (ref 70–99)
Glucose-Capillary: 129 mg/dL — ABNORMAL HIGH (ref 70–99)

## 2017-11-16 LAB — RENAL FUNCTION PANEL
ANION GAP: 14 (ref 5–15)
Albumin: 1.8 g/dL — ABNORMAL LOW (ref 3.5–5.0)
BUN: 24 mg/dL — ABNORMAL HIGH (ref 8–23)
CALCIUM: 7.2 mg/dL — AB (ref 8.9–10.3)
CO2: 19 mmol/L — AB (ref 22–32)
Chloride: 105 mmol/L (ref 98–111)
Creatinine, Ser: 5.48 mg/dL — ABNORMAL HIGH (ref 0.44–1.00)
GFR calc non Af Amer: 8 mL/min — ABNORMAL LOW (ref >60)
GFR, EST AFRICAN AMERICAN: 9 mL/min — AB (ref >60)
Glucose, Bld: 202 mg/dL — ABNORMAL HIGH (ref 70–99)
POTASSIUM: 3.6 mmol/L (ref 3.5–5.1)
Phosphorus: 4.2 mg/dL (ref 2.5–4.6)
SODIUM: 138 mmol/L (ref 135–145)

## 2017-11-16 LAB — CBC
HCT: 23.7 % — ABNORMAL LOW (ref 36.0–46.0)
HEMOGLOBIN: 7.2 g/dL — AB (ref 12.0–15.0)
MCH: 28 pg (ref 26.0–34.0)
MCHC: 30.4 g/dL (ref 30.0–36.0)
MCV: 92.2 fL (ref 78.0–100.0)
Platelets: 259 10*3/uL (ref 150–400)
RBC: 2.57 MIL/uL — AB (ref 3.87–5.11)
RDW: 16.7 % — ABNORMAL HIGH (ref 11.5–15.5)
WBC: 14.9 10*3/uL — AB (ref 4.0–10.5)

## 2017-11-16 LAB — PREPARE RBC (CROSSMATCH)

## 2017-11-16 LAB — ABO/RH: ABO/RH(D): B POS

## 2017-11-16 MED ORDER — ALTEPLASE 2 MG IJ SOLR
2.0000 mg | Freq: Once | INTRAMUSCULAR | Status: DC | PRN
  Filled 2017-11-16: qty 2

## 2017-11-16 MED ORDER — RESOURCE THICKENUP CLEAR PO POWD
ORAL | Status: DC | PRN
  Filled 2017-11-16: qty 125

## 2017-11-16 MED ORDER — PENTAFLUOROPROP-TETRAFLUOROETH EX AERO: 1.0000 "application " | INHALATION_SPRAY | CUTANEOUS | Status: DC | PRN

## 2017-11-16 MED ORDER — SODIUM CHLORIDE 0.9% IV SOLUTION: Freq: Once | INTRAVENOUS | Status: DC

## 2017-11-16 MED ORDER — HEPARIN SODIUM (PORCINE) 1000 UNIT/ML DIALYSIS
1000.0000 [IU] | INTRAMUSCULAR | Status: DC | PRN
  Filled 2017-11-16: qty 1

## 2017-11-16 MED ORDER — DARBEPOETIN ALFA 60 MCG/0.3ML IJ SOSY
PREFILLED_SYRINGE | INTRAMUSCULAR | Status: AC
  Filled 2017-11-16: qty 0.3

## 2017-11-16 MED ORDER — LIDOCAINE-PRILOCAINE 2.5-2.5 % EX CREA
1.0000 "application " | TOPICAL_CREAM | CUTANEOUS | Status: DC | PRN
  Filled 2017-11-16: qty 5

## 2017-11-16 MED ORDER — NEPRO/CARBSTEADY PO LIQD: 237.0000 mL | Freq: Two times a day (BID) | ORAL | Status: DC

## 2017-11-16 MED ORDER — TOPIRAMATE 25 MG PO TABS
25.0000 mg | ORAL_TABLET | Freq: Two times a day (BID) | ORAL | Status: AC
  Administered 2017-11-16 – 2017-11-22 (×13): 25 mg via ORAL
  Filled 2017-11-16 (×13): qty 1

## 2017-11-16 MED ORDER — CHLORHEXIDINE GLUCONATE CLOTH 2 % EX PADS
6.0000 | MEDICATED_PAD | Freq: Every day | CUTANEOUS | Status: DC
  Administered 2017-11-16 – 2017-11-17 (×2): 6 via TOPICAL

## 2017-11-16 MED ORDER — SODIUM CHLORIDE 0.9 % IV BOLUS
250.0000 mL | Freq: Once | INTRAVENOUS | Status: AC
  Administered 2017-11-16: 250 mL via INTRAVENOUS

## 2017-11-16 MED ORDER — STROKE: EARLY STAGES OF RECOVERY BOOK
Freq: Once | Status: AC
  Administered 2017-11-23: 23:00:00
  Filled 2017-11-16: qty 1

## 2017-11-16 MED ORDER — DARBEPOETIN ALFA 60 MCG/0.3ML IJ SOSY
60.0000 ug | PREFILLED_SYRINGE | INTRAMUSCULAR | Status: DC
  Administered 2017-11-16: 60 ug via INTRAVENOUS
  Filled 2017-11-16: qty 0.3

## 2017-11-16 MED ORDER — SODIUM CHLORIDE 0.9 % IV SOLN
125.0000 mg | Freq: Two times a day (BID) | INTRAVENOUS | Status: DC
  Administered 2017-11-17: 125 mg via INTRAVENOUS
  Filled 2017-11-16 (×3): qty 2.5

## 2017-11-16 MED ORDER — SODIUM CHLORIDE 0.9 % IV SOLN: 100.0000 mL | INTRAVENOUS | Status: DC | PRN

## 2017-11-16 MED ORDER — LIDOCAINE HCL (PF) 1 % IJ SOLN: 5.0000 mL | INTRAMUSCULAR | Status: DC | PRN

## 2017-11-16 MED ORDER — INSULIN GLARGINE 100 UNIT/ML ~~LOC~~ SOLN
4.0000 [IU] | Freq: Every day | SUBCUTANEOUS | Status: DC
  Administered 2017-11-16 – 2017-11-17 (×2): 4 [IU] via SUBCUTANEOUS
  Filled 2017-11-16 (×3): qty 0.04

## 2017-11-16 NOTE — Progress Notes (Signed)
With HD dialyze using 2 potassium per Dr Johnney Ou

## 2017-11-16 NOTE — Progress Notes (Signed)
[facility] Progress Note   Subjective:   MRI yesterday with watershed versus embolic. EEG abnormal.   I/Os not documented.  She c/o nausea this morning, thinks eating breakfast will help.   Objective Vitals:   11/15/17 1746 11/15/17 2121 11/16/17 0041 11/16/17 0412  BP: 130/73 (!) 150/80 132/71   Pulse: (!) 102 (!) 118    Resp: 16 20    Temp: 97.9 F (36.6 C) 98.2 F (36.8 C) 98.8 F (37.1 C) 99.1 F (37.3 C)  TempSrc: Oral Oral Oral Oral  SpO2: 100% 100% 100%   Weight:      Height:       Physical Exam General: lying flat in bed awake and comfortable Heart: RRR, no rub Lungs: normal WOB, clear anteriorly Abdomen: soft, nontender Extremities: 1+ LE edema GU: no foley, has pad to collect urine.  Dialysis Access:  Femoral HD catheter - temporary  Additional Objective Labs: Basic Metabolic Panel: Recent Labs  Lab 11/14/17 1615 11/15/17 0807 11/15/17 0924 11/16/17 0514  NA 135 139 137 138  K 3.6 3.7 3.6 3.6  CL 99 103 101 105  CO2 _0 19*  GLUCOSE 125* 93 88 202*  BUN _1 24*  CREATININE 4.81* 5.08* 5.06* 5.48*  CALCIUM 7.0* 7.6* 7.4* 7.2*  PHOS 4.4 4.4  --  4.2   Liver Function Tests: Recent Labs  Lab 11/15/17 0807 11/15/17 0924 11/16/17 0514  AST  --  24  --   ALT  --  12  --   ALKPHOS  --  69  --   BILITOT  --  0.9  --   PROT  --  5.8*  --   ALBUMIN 1.9* 1.9* 1.8*   No results for input(s): LIPASE, AMYLASE in the last 168 hours. CBC: Recent Labs  Lab 11/14/17 0656 11/14/17 1045 11/14/17 1615 11/15/17 0807 11/16/17 0514  WBC 11.9* 12.5* 10.7* 10.3 14.9*  HGB 7.6* 7.8* 7.0* 7.7* 7.2*  HCT 24.1* 25.2* 22.9* 25.9* 23.7*  MCV 88.6 88.7 89.8 91.8 92.2  PLT 259 282 258 242 259   Blood Culture    Component Value Date/Time   SDES  11/08/2017 2007    URINE, RANDOM Performed at [facility], [address] [place], [place] [address]    [facility]  11/08/2017 2007    NONE Performed at [facility], [address] [place], [place] [address]    CULT >=100,000 COLONIES/mL ESCHERICHIA COLI (A) 11/08/2017 2007   REPTSTATUS 11/11/2017 FINAL 11/08/2017 2007    Cardiac Enzymes: Recent Labs  Lab 11/14/17 1045  TROPONINI <0.03   CBG: Recent Labs  Lab 11/15/17 0628 11/15/17 1131 11/15/17 1626 11/15/17 2241 11/16/17 0650  GLUCAP 85 86 109* 202* 239*   Iron Studies:  Recent Labs    11/15/17 0807  IRON 47  TIBC 160*  FERRITIN 383*   _2 @ Studies/Results: Mr Brain Wo Contrast  Result Date: 11/15/2017 CLINICAL DATA:  Acute presentation with left-sided weakness and facial droop. EXAM: MRI HEAD WITHOUT CONTRAST TECHNIQUE: Multiplanar, multiecho pulse sequences of the brain and surrounding structures were obtained without intravenous contrast. COMPARISON:  CT 11/14/2017.  MRI 03/01/2008. FINDINGS: Brain: There are bilaterally symmetric punctate foci restricted diffusion indicating acute infarction in the deep white matter at the frontal parietal vertices. This most likely relates to watershed infarction, though embolic infarction is not completely excluded. No cortical or large vessel territory infarction. No mass lesion, hemorrhage, hydrocephalus or extra-axial collection. There are extensive chronic small-vessel ischemic  changes of the pons. No focal cerebellar insult. Cerebral hemispheres elsewhere show scattered foci of small-vessel change within the white matter. No evidence of mass lesion, hemorrhage, hydrocephalus or extra-axial collection. Vascular: Major vessels at the base of the brain show flow. Skull and upper cervical spine: Negative Sinuses/Orbits: Clear/normal Other: None IMPRESSION: Scattered punctate foci of acute infarction manifest as foci of restricted diffusion within the deep white matter at both frontal parietal vertices. 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 No evidence of cortical or large vessel  territory infarction. Atrophy and chronic small-vessel ischemic changes elsewhere as described above, most prominently affecting the pons. Electronically Signed   By: Nelson Chimes M.D.   On: 11/15/2017 16:11   Ct Head Code Stroke Wo Contrast  Result Date: 11/14/2017 CLINICAL DATA:  Code stroke. LEFT-sided weakness and facial droop with drooling. EXAM: CT HEAD WITHOUT CONTRAST TECHNIQUE: Contiguous axial images were obtained from the base of the skull through the vertex without intravenous contrast. COMPARISON:  09/11/2013. FINDINGS: Brain: No evidence for acute infarction, hemorrhage, mass lesion, hydrocephalus, or extra-axial fluid. Generalized atrophy, premature for age. Hypoattenuation of white matter consistent with small vessel disease. Vascular: Calcification of the cavernous internal carotid arteries consistent with cerebrovascular atherosclerotic disease. No signs of intracranial large vessel occlusion. Skull: Calvarium intact. Sinuses/Orbits: No sinus disease.  Negative orbits. Other: None. ASPECTS [facility] Stroke Program Early CT Score) - Ganglionic level infarction (caudate, lentiform nuclei, internal capsule, insula, M1-M3 cortex): 7 - Supraganglionic infarction (M4-M6 cortex): 3 Total score (0-10 with 10 being normal): 10 IMPRESSION: 1. Atrophy and small vessel disease, mildly progressed from 2015. No acute intracranial findings. No signs of large vessel occlusion. 2. ASPECTS is 10. 3. A text page was placed to the ordering provider at 11:17 a.m. Electronically Signed   By: Staci Righter M.D.   On: 11/14/2017 11:23   Medications: . sodium chloride Stopped (11/11/17 1911)  . sodium chloride    . sodium chloride    . sodium chloride    . sodium chloride    . sodium chloride    . sodium chloride    . sodium chloride 100 mL/hr at 11/16/17 0623  . cefTRIAXone (ROCEPHIN)  IV 2 g (11/15/17 2220)  . levETIRAcetam 1,000 mg (11/16/17 0631)  . levETIRAcetam     . Chlorhexidine Gluconate Cloth  6  each Topical Q0600  . epoetin (EPOGEN/PROCRIT) injection  4,000 Units Subcutaneous Q T,Th,Sa-HD  . feeding supplement (NEPRO CARB STEADY)  237 mL Oral BID BM  . heparin injection (subcutaneous)  5,000 Units Subcutaneous Q8H  . insulin aspart  0-9 Units Subcutaneous TID WC  . loperamide  2 mg Oral BID  . phenytoin (DILANTIN) IV  100 mg Intravenous Q8H     Assessment/Plan:  1.  AKI on CKD:  Recent course with emphysematous pyelonephritis treated at [facility], urologic intervention double J stents placed.  During that admission Cr ~ 8 on presentation, down to 4 on discharge.  Represented 2 weeks later with Cr up to 11.  She was initially hydrated but due lack of improved had HD x 2 at [facility] prior to transfer here on 9/29. No UOP documented yesterday but does not have foley and appears likely incontinent with pad in place.  With recent GU infections (severe) I'm reluctant to place foley.  I had discussed placing purewick with RN yesterday but this did not occur.  No emergent indications for dialysis but eGFR < 10  and she has little muscle mass so I think this likely represents eGFR << 10.  Plan for dialysis today.  Still has temp femoral catheter and if requires ongoing RRT will need to have tunneled line placed.   2.   HTN:   Reasonable control for now.  BP has ranged 130-160.  3.  Anemia:  Hb has trended down significantly from 10.1 on 9/23 to 7.2 today which is stable from yesterday.  Iron indices 9/30 show iron replete.  Hemoccult not yet collected.  Give aranesp 81 with HD today.  Hb was in 9-10 at [place] earlier this month.   4.  BMM: phos 4.2, corr ca normal (albumin 1.8).   5. Seizures:  H/o hypoglycemia induced seizures; neurology following here and dosing meds for dialysis at this time.   6.  E coli UTI: Rocephin 2g daily since 9/26.  7.  DM: per primary.   Jannifer Hick MD 11/16/2017, 7:28 AM  [facility]

## 2017-11-16 NOTE — Evaluation (Signed)
Clinical/Bedside Swallow Evaluation Patient Details  Name: Erica [name] MRN: [id number] Date of Birth: 1955/06/27  Today's Date: 11/16/2017 Time: SLP Start Time (ACUTE ONLY): 1300 SLP Stop Time (ACUTE ONLY): 1330 SLP Time Calculation (min) (ACUTE ONLY): 30 min  Past Medical History:  Past Medical History:  Diagnosis Date  . Alcohol-induced pancreatitis   . Chronic diarrhea   . Depression   . Diabetes mellitus    fasting blood sugar 110-120s  . Diastolic CHF ([place])   . DKA (diabetic ketoacidoses) ([place])   . Gastroparesis   . GERD (gastroesophageal reflux disease)   . Heart murmur   . History of kidney stones   . Hyperlipidemia   . Hypertension   . Hypokalemia   . Muscle spasm   . Neuropathic pain   . Neuropathy    Hx: of  . Pyelonephritis   . Recurrent pancreatitis   . Seizures ([place])   . Vitamin B12 deficiency   . Vitamin D deficiency    Past Surgical History:  Past Surgical History:  Procedure Laterality Date  . CATARACT EXTRACTION W/PHACO Right 03/14/2015   Procedure: CATARACT EXTRACTION PHACO AND INTRAOCULAR LENS PLACEMENT (IOC);  Surgeon: Baruch Goldmann, MD;  Location: [facility];  Service: Ophthalmology;  Laterality: Right;  CDE:11.13  . CATARACT EXTRACTION W/PHACO Left 04/11/2015   Procedure: CATARACT EXTRACTION PHACO AND INTRAOCULAR LENS PLACEMENT LEFT EYE CDE=9.68;  Surgeon: Baruch Goldmann, MD;  Location: [facility];  Service: Ophthalmology;  Laterality: Left;  . COLONOSCOPY  02/24/2010  . cystoscopy with ureteral stent  Bilateral 10/07/2017   At [facility] N/A 06/13/2012   Procedure: MULTIPLE EXTRACION WITH ALVEOLOPLASTY EXTRACT: 18, 19, 20, 21, 22, 24, 25, 27, 28, 29, 30, 31;  Surgeon: Gae Bon, DDS;  Location: [facility];  Service: Oral Surgery;  Laterality: N/A;  . TUBAL LIGATION    . URETERAL STENT PLACEMENT  09/2017   HPI:  62 year old female admitted 11/08/17 with weakness. Transferred to [facility] 11/15/27 due to  seizure activity. PMH: DM2, HTN, seizure disorder, alcohol induced pancreatitis, GERD, HLD. MRI = Scattered punctate foci of acute infarction, c/w watershed infarction.   Assessment / Plan / Recommendation Clinical Impression  Pt presents with generalized weakness. She is edentulous, without dentures at baseline. Volitional cough is weak, voice quality is low in intensity. Pt speech is intelligible, and CN exam is unremarkable. Pt was given trials of thin liquid, nectar thick liquid, puree, and solid consistencies. Cough response noted following thin liquid trials, as well as extended oral prep of solid (graham cracker) Trials of nectar thick liquid, puree, and soft solid were tolerated without significant oral issues, or overt s/s aspiration. Will downgrade diet to dys 2 (finely chopped) and nectar thick liquids. Recommend full supervision during meals. Safe swallow precautions posted at [facility]. RN and MD informed of results and recommendations. SLP will follow for assessment of diet tolerance, readiness to advance and/or need for objective study.    SLP Visit Diagnosis: Dysphagia, unspecified (R13.10)    Aspiration Risk  Mild aspiration risk;Moderate aspiration risk    Diet Recommendation Dysphagia 2 (Fine chop);Nectar-thick liquid   Liquid Administration via: Straw Medication Administration: Whole meds with puree(crush if large, or pt unable to manage pills) Supervision: Full supervision/cueing for compensatory strategies;Staff to assist with self feeding Compensations: Minimize environmental distractions;Small sips/bites;Slow rate;Follow solids with liquid Postural Changes: Remain upright for at least 30 minutes after po intake;Seated upright at 90 degrees    Other  Recommendations Oral Care Recommendations: Oral care BID Other Recommendations: Order thickener from pharmacy   Follow up Recommendations (TBD)      Frequency and Duration min 2x/week  1 week;2 weeks       Prognosis  Prognosis for Safe Diet Advancement: Fair      Swallow Study   General Date of Onset: 11/08/17 HPI: 62 year old female admitted 11/08/17 with weakness. Transferred to [facility] 11/15/27 due to seizure activity. PMH: DM2, HTN, seizure disorder, alcohol induced pancreatitis, GERD, HLD. MRI = Scattered punctate foci of acute infarction, c/w watershed infarction. Type of Study: Bedside Swallow Evaluation Previous Swallow Assessment: none Diet Prior to this Study: Regular;Thin liquids Temperature Spikes Noted: (100) Respiratory Status: Nasal cannula History of Recent Intubation: No Behavior/Cognition: Cooperative;Pleasant mood;Alert Oral Cavity Assessment: Within Functional Limits Oral Care Completed by SLP: No Oral Cavity - Dentition: Edentulous Self-Feeding Abilities: Total assist Patient Positioning: Upright in bed Baseline Vocal Quality: Low vocal intensity Volitional Cough: Weak Volitional Swallow: Able to elicit    Oral/Motor/Sensory Function Overall Oral Motor/Sensory Function: Generalized oral weakness   Ice Chips Ice chips: Not tested   Thin Liquid Thin Liquid: Impaired Presentation: Straw Pharyngeal  Phase Impairments: Suspected delayed Swallow;Cough - Immediate    Nectar Thick Nectar Thick Liquid: Within functional limits Presentation: Straw   Honey Thick Honey Thick Liquid: Not tested   Puree Puree: Within functional limits Presentation: Spoon   Solid     Solid: Impaired Oral Phase Impairments: Impaired mastication(extended oral prep) Oral Phase Functional Implications: Impaired mastication Pharyngeal Phase Impairments: Suspected delayed Swallow     Kharisma Glasner B. Quentin Ore, [facility], [place] Speech Language Pathologist [phone]  Shonna Chock 11/16/2017,1:50 PM

## 2017-11-16 NOTE — Progress Notes (Signed)
PROGRESS NOTE  Erica [name] MIW:[id number] DOB: 1955-05-15 DOA: 11/08/2017 PCP: Octavio Graves, DO  Brief History: 62 year old female with a history of diabetes mellitus, hypertension, seizure disorder presenting with generalized weakness x2 to 3 dayswith confusion.The patient was also complaining of lower abd pain with dysuria.The patient was recently hospitalized at [facility] from 10/07/2017 through 10/21/2017 during which she was treated for emphysematous pyelonephritis and acute on chronic renal failure. She also was treated for DKA and fluid overload.She was noted to have hemoglobin A1c of 12.1. She required transfusion 2 u PRBC without evidence of active bleeding.She underwent bilateral ureteral stenting on 10/07/2017. She was treated appropriately with antibiotics. She was discharged with instructions for outpatient cystoscopy and removal of stents in 2 to 3 weeks. Upon thisadmission, the patient has serum creatinine of 8.1. At the time of discharge, her serum creatinine has stabilized between 3.7-3.9.Nephrology was consulted to assist with management. Urology was also consulted due to retained stents--they are currently planning to remove stents .Marland Kitchen  Patient had seizures 11/14/2017, she was transferred to [facility] for further work-up , her MRI brain significant for scattered infarcts, as well her seizures has been managed by neurology, hemodialysis has been continued during this hospital stay.  Assessment/Plan:  Acute on chronic renal failure--stage unspecified presently -etiology likely infection and hemodynamic changes/ATN -Treatment per renal, already received hemodialysis 9/27 and 9/28 at any point hospital. -Followed by renal at [facility], she will be dialyzed today, unclear she will be end-stage renal disease and will be dialysis dependent or not . -She has right groin temporary hemodialysis catheter, depends if she becomes out of his  dependent, then will have vascular consulted for permanent access.  Pyelonephritis -11/08/17 urine culture = Ecoli -continue ceftriaxone -11/08/2017 CT renal protocol--bilateral ureteral stents without hydronephrosis. Small bilateral renal calculi. Small amount of ascites with anasarca. Thickened stomach may be related to ascites. -ureteral stents initially placed 10/07/17 -Discussed with Dr. Jeffie Pollock 11/15/2017,  he will arrange for stent removal during hospital stay at [facility]  Seizure disorder/Left Hemiparesis -Seizures appear to be new onset after dialysis, started on antiepileptics per neurology, further management per neuro.  Acute CVA  -MRI significant for watershed infarction at both frontal and parietal varices, further management per neuro, await further recommendation.  Diabetes mellitus type 2, uncontrolled with hyperglycemia -And this has been stopped initially due to hypoglycemia, CBGs are elevated today, so she will be started on Lantus 4 units daily. -10/07/17 A1C = 12.1  Essential hypertension -Continue metoprolol succinate>>>IV lopressor as pt had been refusing pills -Holding amlodipine secondary to soft blood pressure -d/c lopressor now due to soft BPs  Metabolic acidosis -Secondary to acute on chronic renal failure -initially on bicarbonate drip per nephrology -now improved on HD  Diarrhea -6 loose BMs in past 24 hours -check Cdiff--neg -Has Flexi-Seal back, will start on Imodium  Severe malnutrition -continue supplements  Hypocalcemia -Regimen per renal  Anemia -Blood pressure was around 9 during recent hospital stay in [place], and was around 9 on admission, slowly drifting down, 7.2 today, I will transfuse 1 unit of PRBC. -Anemia of chronic blood loss, and chronic illness and kidney disease, he is receiving Procrit with dialysis, at this point no IV iron in the setting of her severe infection from pyelonephritis. -He is Hemoccult positive,  this is most likely in the setting diarrhea, as she has Flexi-Seal with green-colored stool in the bag, no significant evidence of  GI bleed, for now will start on Protonix 40 twice daily.  diarrhea -Not much more since yesterday on her Flexi-Seal back, I will discontinue, stool work-up has been negative     Disposition Plan: Remains in stepdown  Family Communication:Discussed with son via phone   Consultants:nephrology, urology, neurology  Code Status: FULL  DVT Prophylaxis: SCDs, subcu heparin   Procedures: As Listed in Progress Note Above  Antibiotics: Ceftriaxone 9/23>>>   Subjective: Patient is awake and alert, significant events overnight, she denies any fever or chills, her Flexi-Seal bag did not have much output over last 24 hours  Objective: Vitals:   11/15/17 2121 11/16/17 0041 11/16/17 0412 11/16/17 0846  BP: (!) 150/80 132/71  (!) 158/85  Pulse: (!) 118   (!) 123  Resp: 20   20  Temp: 98.2 F (36.8 C) 98.8 F (37.1 C) 99.1 F (37.3 C) 97.8 F (36.6 C)  TempSrc: Oral Oral Oral Axillary  SpO2: 100% 100%  100%  Weight:      Height:        Intake/Output Summary (Last 24 hours) at 11/16/2017 1103 Last data filed at 11/16/2017 0800 Gross per 24 hour  Intake 180 ml  Output -  Net 180 ml   Weight change:  Exam:  Is awake alert x3, pleasant, slow to answer questions . Left upper chest PICC line, and right groin temporary hemodialysis catheter  Good air entry bilaterally, no wheezing rales or rhonchi  Abdomen soft, nontender, nondistended, bowel sounds present  Extremities with no edema, clubbing or cyanosis,     Data Reviewed: I have personally reviewed following labs and imaging studies Basic Metabolic Panel: Recent Labs  Lab 11/13/17 0824  11/14/17 0656 11/14/17 1045 11/14/17 1615 11/15/17 0807 11/15/17 0924 11/16/17 0514  NA 135   < > 138 138 135 139 137 138  K 3.0*   < > 3.6 3.7 3.6 3.7 3.6 3.6  CL 93*   < > 97* 96*  99 103 101 105  CO2 30   < > 27 26 23 24 22  19*  GLUCOSE 130*   < > 80 81 125* 93 88 202*  BUN 39*   < > 23 23 22 23 23  24*  CREATININE 6.55*   < > 4.57* 4.81* 4.81* 5.08* 5.06* 5.48*  CALCIUM 5.9*   < > 7.1* 7.4* 7.0* 7.6* 7.4* 7.2*  PHOS 3.5  --  3.6  --  4.4 4.4  --  4.2   < > = values in this interval not displayed.   Liver Function Tests: Recent Labs  Lab 11/14/17 0656 11/14/17 1615 11/15/17 0807 11/15/17 0924 11/16/17 0514  AST  --   --   --  24  --   ALT  --   --   --  12  --   ALKPHOS  --   --   --  69  --   BILITOT  --   --   --  0.9  --   PROT  --   --   --  5.8*  --   ALBUMIN 2.0* 1.8* 1.9* 1.9* 1.8*   No results for input(s): LIPASE, AMYLASE in the last 168 hours. No results for input(s): AMMONIA in the last 168 hours. Coagulation Profile: Recent Labs  Lab 11/14/17 1045  INR 1.47   CBC: Recent Labs  Lab 11/14/17 0656 11/14/17 1045 11/14/17 1615 11/15/17 0807 11/16/17 0514  WBC 11.9* 12.5* 10.7* 10.3 14.9*  HGB 7.6* 7.8* 7.0* 7.7*  7.2*  HCT 24.1* 25.2* 22.9* 25.9* 23.7*  MCV 88.6 88.7 89.8 91.8 92.2  PLT 259 282 258 242 259   Cardiac Enzymes: Recent Labs  Lab 11/14/17 1045  TROPONINI <0.03   BNP: Invalid input(s): POCBNP CBG: Recent Labs  Lab 11/15/17 0628 11/15/17 1131 11/15/17 1626 11/15/17 2241 11/16/17 0650  GLUCAP 85 86 109* 202* 239*   HbA1C: No results for input(s): HGBA1C in the last 72 hours. Urine analysis:    Component Value Date/Time   COLORURINE YELLOW 11/08/2017 2007   APPEARANCEUR TURBID (A) 11/08/2017 2007   LABSPEC 1.009 11/08/2017 2007   PHURINE 6.0 11/08/2017 2007   GLUCOSEU NEGATIVE 11/08/2017 2007   HGBUR MODERATE (A) 11/08/2017 2007   BILIRUBINUR NEGATIVE 11/08/2017 2007   KETONESUR NEGATIVE 11/08/2017 2007   PROTEINUR 100 (A) 11/08/2017 2007   UROBILINOGEN 0.2 07/25/2014 2309   NITRITE NEGATIVE 11/08/2017 2007   LEUKOCYTESUR MODERATE (A) 11/08/2017 2007   Sepsis  Labs: @LABRCNTIP (procalcitonin:4,lacticidven:4) ) Recent Results (from the past 240 hour(s))  Urine culture     Status: Abnormal   Collection Time: 11/08/17  8:07 PM  Result Value Ref Range Status   Specimen Description   Final    URINE, RANDOM Performed at [facility], [address] [place], [place] [address]    Special Requests   Final    NONE Performed at [facility], [address], [place], [place] [address]    Culture >=100,000 COLONIES/mL ESCHERICHIA COLI (A)  Final   Report Status 11/11/2017 FINAL  Final   Organism ID, Bacteria ESCHERICHIA COLI (A)  Final      Susceptibility   Escherichia coli - MIC*    AMPICILLIN >=32 RESISTANT Resistant     CEFAZOLIN <=4 SENSITIVE Sensitive     CEFTRIAXONE <=1 SENSITIVE Sensitive     CIPROFLOXACIN <=0.25 SENSITIVE Sensitive     GENTAMICIN <=1 SENSITIVE Sensitive     IMIPENEM <=0.25 SENSITIVE Sensitive     NITROFURANTOIN <=16 SENSITIVE Sensitive     TRIMETH/SULFA <=20 SENSITIVE Sensitive     AMPICILLIN/SULBACTAM 16 INTERMEDIATE Intermediate     PIP/TAZO <=4 SENSITIVE Sensitive     Extended ESBL NEGATIVE Sensitive     * >=100,000 COLONIES/mL ESCHERICHIA COLI  C difficile quick scan w PCR reflex     Status: None   Collection Time: 11/13/17 11:17 PM  Result Value Ref Range Status   C Diff antigen NEGATIVE NEGATIVE Final   C Diff toxin NEGATIVE NEGATIVE Final   C Diff interpretation No C. difficile detected.  Final    Comment: VALID Performed at [facility], [address] [place], [place] [address]   MRSA PCR Screening     Status: None   Collection Time: 11/14/17  5:26 AM  Result Value Ref Range Status   MRSA by PCR NEGATIVE NEGATIVE Final    Comment:        The GeneXpert MRSA Assay (FDA approved for NASAL specimens only), is one component of a comprehensive MRSA colonization surveillance program. It is not intended to diagnose MRSA infection nor to guide or monitor treatment for MRSA infections. Performed at [facility], [place] [address], [place], [place] [address]      Scheduled Meds: . Chlorhexidine Gluconate Cloth  6 each Topical Q0600  . darbepoetin (ARANESP) injection - DIALYSIS  60 mcg Intravenous Q Tue-HD  . feeding supplement (NEPRO CARB STEADY)  237 mL Oral BID BM  . heparin injection (subcutaneous)  5,000 Units Subcutaneous Q8H  . insulin  aspart  0-9 Units Subcutaneous TID WC  . phenytoin (DILANTIN) IV  100 mg Intravenous Q8H   Continuous Infusions: . sodium chloride Stopped (11/11/17 1911)  . sodium chloride    . sodium chloride    . sodium chloride    . sodium chloride    . sodium chloride    . sodium chloride    . sodium chloride 100 mL/hr at 11/16/17 0623  . cefTRIAXone (ROCEPHIN)  IV 2 g (11/15/17 2220)  . levETIRAcetam 1,000 mg (11/16/17 0631)  . levETIRAcetam      Procedures/Studies: Mr Brain Wo Contrast  Result Date: 11/15/2017 CLINICAL DATA:  Acute presentation with left-sided weakness and facial droop. EXAM: MRI HEAD WITHOUT CONTRAST TECHNIQUE: Multiplanar, multiecho pulse sequences of the brain and surrounding structures were obtained without intravenous contrast. COMPARISON:  CT 11/14/2017.  MRI 03/01/2008. FINDINGS: Brain: There are bilaterally symmetric punctate foci restricted diffusion indicating acute infarction in the deep white matter at the frontal parietal vertices. This most likely relates to watershed infarction, though embolic infarction is not completely excluded. No cortical or large vessel territory infarction. No mass lesion, hemorrhage, hydrocephalus or extra-axial collection. There are extensive chronic small-vessel ischemic changes of the pons. No focal cerebellar insult. Cerebral hemispheres elsewhere show scattered foci of small-vessel change within the white matter. No evidence of mass lesion, hemorrhage, hydrocephalus or extra-axial collection. Vascular: Major vessels at the base of the brain show flow. Skull and upper cervical spine: Negative  Sinuses/Orbits: Clear/normal Other: None IMPRESSION: Scattered punctate foci of acute infarction manifest as foci of restricted diffusion within the deep white matter at both frontal parietal vertices. 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 No evidence of cortical or large vessel territory infarction. Atrophy and chronic small-vessel ischemic changes elsewhere as described above, most prominently affecting the pons. Electronically Signed   By: Nelson Chimes M.D.   On: 11/15/2017 16:11   US Renal  Result Date: 11/09/2017 CLINICAL DATA:  62 year old female with chronic kidney disease stage 3. Subsequent encounter. EXAM: RENAL / URINARY TRACT ULTRASOUND COMPLETE COMPARISON:  11/08/2017 CT. FINDINGS: Right Kidney: Length: 10.1 cm. Minimal increased echogenicity. No hydronephrosis or mass. Left Kidney: Length: 10.5 cm. Echogenicity within normal limits. No mass or hydronephrosis visualized. Bladder: Debris within the bladder.  Bilateral ureteral stents in place. IMPRESSION: 1. Bilateral ureteral stents in place.  No hydronephrosis. 2. Debris within the urinary bladder. 3. Slight increased echogenicity right renal parenchyma, may reflect changes of medical renal disease. Electronically Signed   By: Genia Del M.D.   On: 11/09/2017 14:54   Dg Chest Port 1 View  Result Date: 11/09/2017 CLINICAL DATA:  PICC line placement EXAM: PORTABLE CHEST 1 VIEW COMPARISON:  10/06/2017 FINDINGS: Left internal jugular PICC line is been placed with the tip at the cavoatrial junction. No pneumothorax. Heart is normal size. Lungs clear. No effusions or acute bony abnormality. IMPRESSION: Left PICC line tip at the cavoatrial junction.  No pneumothorax. Electronically Signed   By: Rolm Baptise M.D.   On: 11/09/2017 12:58   Ct Renal Stone Study  Result Date: 11/08/2017 CLINICAL DATA:  62 year old female with UTI. History of kidney stones. Displacement  of indwelling urethral stent. EXAM: CT ABDOMEN AND PELVIS WITHOUT CONTRAST TECHNIQUE: Multidetector CT imaging of the abdomen and pelvis was performed following the standard protocol without IV contrast. COMPARISON:  CT of the abdomen pelvis dated 12/07/2012 FINDINGS: Evaluation of this exam is limited in the absence of intravenous  contrast. Lower chest: Bibasilar linear atelectasis/scarring. There is calcification of the mitral annulus. There is hypoattenuation of the cardiac blood pool suggestive of a degree of anemia. Clinical correlation is recommended. No intra-abdominal free air. Small ascites and diffuse mesenteric edema. Hepatobiliary: The liver is unremarkable. There is mild intrahepatic biliary ductal dilatation as seen on the prior CT. The gallbladder is not visualized, likely surgically absent. Pancreas: The pancreas is atrophic with coarse calcifications likely sequela of chronic pancreatitis. Spleen: Normal in size without focal abnormality. Adrenals/Urinary Tract: The adrenal glands are unremarkable. Small faint hypodensities within the renal collecting systems on likely represent small nonobstructing calculi versus residual excreted contrast from recent IV administration. Clinical correlation is recommended. Bilateral pigtail ureteral stents noted. The proximal tip of the right ureteral stent is in the right renal upper pole collecting system and the proximal tip of the left ureteral stent is in the left renal pelvis. The distal ends of the stents are within the urinary bladder. There is no hydronephrosis on either side. Stomach/Bowel: There is diffuse thickened appearance of the wall of the stomach as well as thickened appearance of the colon, likely related to ascites. Gastritis or colitis are considered less likely. Clinical correlation is recommended. There is no bowel obstruction. The appendix is normal. Vascular/Lymphatic: The abdominal aorta and IVC are grossly unremarkable on this noncontrast  CT. No portal venous gas. Top-normal left retroperitoneal lymph nodes. Reproductive: Vascular calcification of the peripheral uterus noted. No pelvic mass. Other: Diffuse subcutaneous edema and anasarca. Musculoskeletal: There is degenerative changes of the spine. Grade 1 L4-L5 anterolisthesis. There is compression fracture of the superior endplate of the L2, age indeterminate but new since the prior CT of 2014. Compression fracture of the inferior endplate of T01 and S01. The T12 compression fracture is new since the prior CT. There is no retropulsed fragment. IMPRESSION: 1. Bilateral ureteral stents in place as described. No hydronephrosis. 2. Probable small bilateral renal calculi versus residual contrast from recent study. 3. Small ascites, diffuse edema, and anasarca of indeterminate etiology. Clinical correlation is recommended. 4. Thickened stomach and colon, likely related to ascites. Electronically Signed   By: Anner Crete M.D.   On: 11/08/2017 21:41   Ct Head Code Stroke Wo Contrast  Result Date: 11/14/2017 CLINICAL DATA:  Code stroke. LEFT-sided weakness and facial droop with drooling. EXAM: CT HEAD WITHOUT CONTRAST TECHNIQUE: Contiguous axial images were obtained from the base of the skull through the vertex without intravenous contrast. COMPARISON:  09/11/2013. FINDINGS: Brain: No evidence for acute infarction, hemorrhage, mass lesion, hydrocephalus, or extra-axial fluid. Generalized atrophy, premature for age. Hypoattenuation of white matter consistent with small vessel disease. Vascular: Calcification of the cavernous internal carotid arteries consistent with cerebrovascular atherosclerotic disease. No signs of intracranial large vessel occlusion. Skull: Calvarium intact. Sinuses/Orbits: No sinus disease.  Negative orbits. Other: None. ASPECTS [facility] Stroke Program Early CT Score) - Ganglionic level infarction (caudate, lentiform nuclei, internal capsule, insula, M1-M3 cortex): 7 -  Supraganglionic infarction (M4-M6 cortex): 3 Total score (0-10 with 10 being normal): 10 IMPRESSION: 1. Atrophy and small vessel disease, mildly progressed from 2015. No acute intracranial findings. No signs of large vessel occlusion. 2. ASPECTS is 10. 3. A text page was placed to the ordering provider at 11:17 a.m. Electronically Signed   By: Staci Righter M.D.   On: 11/14/2017 11:23    Phillips Climes, MD  [facility] Pager [phone]  If 7PM-7AM, please contact night-coverage [email or web address] Password [email or web address] 11/16/2017, 11:03 AM   LOS:  8 days

## 2017-11-16 NOTE — Progress Notes (Signed)
Patient's Left internal jugular PICC pulled out approximately 4cm by RN while doing dressing change.  RN from ICU Kaitlin clean PICC thoroughly at replaced with new dressing.  Dr. Urban Gibson notified aabout this incident.  Verbal order to continue using the Lt internal jugular PICC and will have IR place a tryalysis catheter in tomorrow.  Planned to discontinued both Lt Internal jugular picc and also Rt femoral HD catheter after dialysis today.

## 2017-11-16 NOTE — Progress Notes (Addendum)
Subjective: Patient is awake, still believes that she is at [facility] but is aware that she is at hospital.  She is complaining of abdominal pain.  Exam: Vitals:   11/16/17 0412 11/16/17 0846  BP:  (!) 158/85  Pulse:  (!) 123  Resp:  20  Temp: 99.1 F (37.3 C) 97.8 F (36.6 C)  SpO2:  100%    Physical Exam   HEENT-  Normocephalic, no lesions, without obvious abnormality.  Normal external eye and conjunctiva.   Extremities-warm dry with decreased muscle mass Musculoskeletal-no joint tenderness, deformity or swelling Skin-warm and dry, no hyperpigmentation, vitiligo, or suspicious lesions    Neuro:  Mental Status: As noted above patient is alert, believe she is at [facility], speech remains dysarthric without any evidence of aphasia.  Able to follow simple commands without difficulty. Cranial Nerves: II: Able to count my fingers. III,IV, VI: Able to follow my fingers left right up and down, pupils reactive V,VII: smile slightly asymmetric however she is a dentulous, facial light touch sensation normal bilaterally VIII: hearing normal bilaterally XII: midline tongue extension Motor: At this point able to hold bilateral upper extremities antigravity with no rhythmic jerking.  She has difficulty lifting her legs off the bed.  When I passively hold her left leg off the bed I note that she has increased rhythmic jerking.  When at rest on the bed she does have mild rhythmic jerking again.  Right leg shows no rhythmic jerking however is again unable to lift off the bed.  As noted she has decreased muscle mass bilaterally in her legs. Sensory: Pinprick and light touch intact throughout, bilaterally Deep Tendon Reflexes: 2+ and symmetric throughout Plantars: Upgoing bilaterally   Medications:  Scheduled: . Chlorhexidine Gluconate Cloth  6 each Topical Q0600  . darbepoetin (ARANESP) injection - DIALYSIS  60 mcg Intravenous Q Tue-HD  . feeding supplement (NEPRO CARB  STEADY)  237 mL Oral BID BM  . heparin injection (subcutaneous)  5,000 Units Subcutaneous Q8H  . insulin aspart  0-9 Units Subcutaneous TID WC  . loperamide  2 mg Oral BID  . phenytoin (DILANTIN) IV  100 mg Intravenous Q8H   Continuous: . sodium chloride Stopped (11/11/17 1911)  . sodium chloride    . sodium chloride    . sodium chloride    . sodium chloride    . sodium chloride    . sodium chloride    . sodium chloride 100 mL/hr at 11/16/17 0623  . cefTRIAXone (ROCEPHIN)  IV 2 g (11/15/17 2220)  . levETIRAcetam 1,000 mg (11/16/17 0631)  . levETIRAcetam      Pertinent Labs/Diagnostics: As of 11/15/2017 -Creatinine of 5.06 -Calcium 7.4 -Albumin 1.9 - AST 24 -ALT 12 - Corrected Dilantin 25  EEG: IMPRESSION: This is an abnormal electroencephalogram due to a discontinuous slow rhythm with higher voltage over the right centro-parietal region.  Clinical correlation recommended.    Mr Brain Wo Contrast  Result Date: 11/15/2017  IMPRESSION: Scattered punctate foci of acute infarction manifest as foci of restricted diffusion within the deep white matter at both frontal parietal vertices. 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 No evidence of cortical or large vessel territory infarction. Atrophy and chronic small-vessel ischemic changes elsewhere as described above, most prominently affecting the pons. Electronically Signed   By: Nelson Chimes M.D.   On: 11/15/2017 16:11        Etta Quill PA-C [facility] [phone]  Assessment: 62 year old female with breakthrough seizure after starting dialysis.  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  I would not favor treating this aggressively.  Certainly her seizures could have been precipitated with her hypotensive episode as evidenced by the MRI  findings which I suspect represent a watershed event.  It would be reasonable to do an echocardiogram and telemetry, but I would not modify therapy further than this given the ideology.  Recommendations: -Continue keppra 1 g daily, with additional 500 after dialysis -Decrease Dilantin to 125 twice daily -At this time would recommend starting Topamax 25 mg twice daily for 1 week and then increase to 50 mg twice daily  Roland Rack, MD [facility] [phone]  If 7pm- 7am, please page neurology on call as listed in [place].   11/16/2017, 9:33 AM

## 2017-11-16 NOTE — Progress Notes (Addendum)
   Patient Status: MC IP  Assessment and Plan: Patient in need of venous access.   Temporary trialysis catheter placement Possible right femoral temporary dialysis removal Possible left IJ central catheter removal  ______________________________________________________________________   History of Present Illness: Erica [name] is a 62 y.o. female   HTN; DM; Seizure Weakness/confusion Hospital stay at [place] 8/22-10/21/2017: emphysematous pyelonephritis and A/C Renal failure B Ureteral stents placed 8/22 For removal in 2-3 weeks post placement per MD  9/27: Adm to [facility] for general weakness; poss UTI; confusion; loose stools 9/27: Rt temp dialysis cath placed in OR at [facility]  Tx to [facility] 9/29: breakthrough Sz after dialysis New spike in temp Tmax 102; rise in wbc 11- 13 today MD requesting removal of right Temp femoral cath and left IJ line And Placement of temporary Trialysis catheter in am   Allergies and medications reviewed.   Review of Systems: A 12 point ROS discussed and pertinent positives are indicated in the HPI above.  All other systems are negative.   Vital Signs: BP 135/65 (BP Location: Left Arm)   Pulse (!) 115   Temp 100 F (37.8 C) (Axillary) Comment: RN notified  Resp 16   Ht 5' (1.524 m)   Wt 110 lb 3.7 oz (50 kg)   SpO2 99%   BMI 21.53 kg/m   Physical Exam  Cardiovascular: Normal rate and regular rhythm.  Pulmonary/Chest: Effort normal and breath sounds normal.  Abdominal: Soft. Bowel sounds are normal.  Musculoskeletal: Normal range of motion.  Neurological: She is alert.  Slow to speak Cannot answer all questions appropriately  Skin: Skin is warm and dry.  Psychiatric:  Able to tell me name and DOB Not oriented to place or reason for admission  Vitals reviewed.    Imaging reviewed.   Labs:  COAGS: Recent Labs    11/14/17 1045  INR 1.47  APTT 38*    BMP: Recent Labs    11/14/17 1615 11/15/17 0807 11/15/17 0924  11/16/17 0514  NA 135 139 137 138  K 3.6 3.7 3.6 3.6  CL 99 103 101 105  CO2 23 24 22  19*  GLUCOSE 125* 93 88 202*  BUN 22 23 23  24*  CALCIUM 7.0* 7.6* 7.4* 7.2*  CREATININE 4.81* 5.08* 5.06* 5.48*  GFRNONAA 9* 8* 8* 8*  GFRAA 10* 10* 10* 9*   Pt is scheduled for Temporary Trialysis catheter placement in IR 10/2 Need to gain consent from family-- No Answer via phone at this time Planned placement in am in IR Possible removal of Rt temp dialysis femoral catheter And left central catheter  Electronically Signed: Marx Doig A, PA-C 11/16/2017, 1:43 PM   I spent a total of 30 minutes in face to face in clinical consultation, greater than 50% of which was counseling/coordinating care for venous access.Patient ID: Erica [name], female   DOB: [birth year], 62 y.o.   MRN: [id number]

## 2017-11-17 ENCOUNTER — Encounter (HOSPITAL_COMMUNITY): Payer: Self-pay | Admitting: Interventional Radiology

## 2017-11-17 ENCOUNTER — Inpatient Hospital Stay (HOSPITAL_COMMUNITY): Payer: Medicaid Other

## 2017-11-17 DIAGNOSIS — I5030 Unspecified diastolic (congestive) heart failure: Secondary | ICD-10-CM

## 2017-11-17 DIAGNOSIS — G40909 Epilepsy, unspecified, not intractable, without status epilepticus: Secondary | ICD-10-CM

## 2017-11-17 HISTORY — PX: IR FLUORO GUIDE CV LINE RIGHT: IMG2283

## 2017-11-17 HISTORY — PX: IR US GUIDE VASC ACCESS RIGHT: IMG2390

## 2017-11-17 LAB — GLUCOSE, CAPILLARY
GLUCOSE-CAPILLARY: 80 mg/dL (ref 70–99)
GLUCOSE-CAPILLARY: 91 mg/dL (ref 70–99)
Glucose-Capillary: 81 mg/dL (ref 70–99)
Glucose-Capillary: 114 mg/dL — ABNORMAL HIGH (ref 70–99)

## 2017-11-17 LAB — BPAM RBC
Blood Product Expiration Date: 201910122359
ISSUE DATE / TIME: 201910011548
Unit Type and Rh: 7300

## 2017-11-17 LAB — BASIC METABOLIC PANEL
ANION GAP: 10 (ref 5–15)
BUN: 14 mg/dL (ref 8–23)
CALCIUM: 7.6 mg/dL — AB (ref 8.9–10.3)
CO2: 23 mmol/L (ref 22–32)
Chloride: 107 mmol/L (ref 98–111)
Creatinine, Ser: 3.71 mg/dL — ABNORMAL HIGH (ref 0.44–1.00)
GFR, EST AFRICAN AMERICAN: 14 mL/min — AB (ref >60)
GFR, EST NON AFRICAN AMERICAN: 12 mL/min — AB (ref >60)
Glucose, Bld: 107 mg/dL — ABNORMAL HIGH (ref 70–99)
Potassium: 3.4 mmol/L — ABNORMAL LOW (ref 3.5–5.1)
SODIUM: 140 mmol/L (ref 135–145)

## 2017-11-17 LAB — ECHOCARDIOGRAM COMPLETE
HEIGHTINCHES: 60 in
WEIGHTICAEL: 1763.68 [oz_av]

## 2017-11-17 LAB — TYPE AND SCREEN
ABO/RH(D): B POS
ANTIBODY SCREEN: NEGATIVE
UNIT DIVISION: 0

## 2017-11-17 LAB — PHENYTOIN LEVEL, TOTAL: PHENYTOIN LVL: 12.4 ug/mL (ref 10.0–20.0)

## 2017-11-17 MED ORDER — SODIUM CHLORIDE 0.9% FLUSH
10.0000 mL | Freq: Two times a day (BID) | INTRAVENOUS | Status: DC
  Administered 2017-11-18 – 2017-11-28 (×11): 10 mL

## 2017-11-17 MED ORDER — SODIUM CHLORIDE 0.9% FLUSH
10.0000 mL | INTRAVENOUS | Status: DC | PRN
  Administered 2017-11-18 (×2): 10 mL
  Filled 2017-11-17 (×2): qty 40

## 2017-11-17 MED ORDER — HEPARIN SODIUM (PORCINE) 1000 UNIT/ML IJ SOLN
INTRAMUSCULAR | Status: AC
  Filled 2017-11-17: qty 1

## 2017-11-17 MED ORDER — LIDOCAINE HCL 1 % IJ SOLN
INTRAMUSCULAR | Status: AC
  Filled 2017-11-17: qty 20

## 2017-11-17 MED ORDER — LIDOCAINE HCL 1 % IJ SOLN
INTRAMUSCULAR | Status: DC | PRN
  Administered 2017-11-17: 5 mL

## 2017-11-17 MED ORDER — HEPARIN SODIUM (PORCINE) 1000 UNIT/ML IJ SOLN
INTRAMUSCULAR | Status: DC | PRN
  Administered 2017-11-17: 2800 [IU] via INTRAVENOUS

## 2017-11-17 MED ORDER — CHLORHEXIDINE GLUCONATE CLOTH 2 % EX PADS
6.0000 | MEDICATED_PAD | Freq: Every day | CUTANEOUS | Status: DC
  Administered 2017-11-18 – 2017-11-29 (×6): 6 via TOPICAL

## 2017-11-17 MED ORDER — PHENYTOIN 125 MG/5ML PO SUSP
125.0000 mg | Freq: Two times a day (BID) | ORAL | Status: DC
  Administered 2017-11-18 – 2017-11-29 (×23): 125 mg via ORAL
  Filled 2017-11-17 (×5): qty 8
  Filled 2017-11-17 (×2): qty 5
  Filled 2017-11-17 (×2): qty 8
  Filled 2017-11-17 (×3): qty 5
  Filled 2017-11-17: qty 8
  Filled 2017-11-17: qty 5
  Filled 2017-11-17 (×2): qty 8
  Filled 2017-11-17: qty 5
  Filled 2017-11-17 (×2): qty 8
  Filled 2017-11-17: qty 5
  Filled 2017-11-17 (×2): qty 8
  Filled 2017-11-17: qty 5
  Filled 2017-11-17 (×2): qty 8
  Filled 2017-11-17 (×2): qty 5
  Filled 2017-11-17 (×2): qty 8
  Filled 2017-11-17: qty 5
  Filled 2017-11-17 (×5): qty 8

## 2017-11-17 NOTE — Progress Notes (Signed)
Patient had a cup with paper towel in the cup on her bed.  When I moved the cup to reposition patient, patient advised that she needed the cup to spit in.  RN asked her if it was to spit her snuff in or to spit in.  Patient said for spit, however when RN gave patient a new cup, patient spit had snuff in it.  Patient spit out the entire snuff and RN wiped patient mouth out with wet washcloth.  RN educated patient about not having snuff  Based on patient's SLP eval patient is to be full supervision

## 2017-11-17 NOTE — Progress Notes (Signed)
Paged Dr. Lorraine Lax to notify of patients increased facial/eye twitching tonight.  Will continue to monitor patient.

## 2017-11-17 NOTE — Progress Notes (Signed)
  Echocardiogram Echocardiogram has been performed.  Erica [name] 11/17/2017, 4:36 PM

## 2017-11-17 NOTE — Progress Notes (Addendum)
Subjective: Patient is much improved today.  She is awake and knows that she is at [facility].  She is following commands the best of her ability.  Exam: Vitals:   11/17/17 0344 11/17/17 0806  BP: (!) 112/54 (!) 143/74  Pulse:  (!) 104  Resp:    Temp: 98.3 F (36.8 C) 98.1 F (36.7 C)  SpO2:  99%    Physical Exam   HEENT-  Normocephalic, no lesions, without obvious abnormality.  Normal external eye and conjunctiva.   Extremities- Warm, dry and intact Musculoskeletal-atrophy in the right leg Skin-warm and dry, no hyperpigmentation, vitiligo, or suspicious lesions    Neuro:  Mental Status: Alert, oriented to hospital and city but not the year, response appropriate.  Speech fluent without evidence of aphasia.  Able to follow 3 step commands without difficulty. Cranial Nerves: II:  Visual fields grossly normal,  III,IV, VI: ptosis not present, extra-ocular motions intact bilaterally pupils equal, round, reactive to light and accommodation V,VII: Slight right facial droop however she is a dentulous, facial light touch sensation normal bilaterally VIII: hearing normal bilaterally  Motor: Moves bilateral upper extremities antigravity again she had difficulty lifting her legs off the bed.  There was no twitching of either leg noted today.  As noted before decreased muscle mass in both legs with the right leg greater than left Sensory: Pinprick and light touch intact throughout, bilaterally Deep Tendon Reflexes: 2+ and symmetric throughout Plantars: Going bilaterally Cerebellar: normal finger-to-nose,     Medications:  Prior to Admission:  Medications Prior to Admission  Medication Sig Dispense Refill Last Dose  . albuterol (PROAIR HFA) 108 (90 BASE) MCG/ACT inhaler Inhale 2 puffs into the lungs every 6 (six) hours as needed for wheezing or shortness of breath.   unknown  . amLODipine (NORVASC) 5 MG tablet Take 5 mg by mouth daily.   11/08/2017 at Unknown time  .  cholecalciferol (VITAMIN D) 1000 units tablet Take 1,000 Units by mouth daily.   11/08/2017 at Unknown time  . esomeprazole (NEXIUM) 40 MG capsule Take 40 mg by mouth daily.    11/08/2017 at Unknown time  . hyoscyamine (ANASPAZ) 0.125 MG TBDP disintergrating tablet Take 0.125 mg by mouth 4 (four) times daily -  with meals and at bedtime.    11/08/2017 at Unknown time  . insulin aspart (NOVOLOG) 100 UNIT/ML FlexPen Inject 3 Units into the skin 3 (three) times daily with meals. (Patient taking differently: Inject 4-10 Units into the skin 3 (three) times daily with meals. Take 15 minutes before meals) 3 mL 0 11/08/2017 at Unknown time  . Insulin Degludec (TRESIBA FLEXTOUCH) 200 UNIT/ML SOPN Inject 10 Units into the skin daily with breakfast.    11/08/2017 at Unknown time  . levETIRAcetam (KEPPRA) 1000 MG tablet Take 1 tablet (1,000 mg total) by mouth 2 (two) times daily. 60 tablet 0 11/08/2017 at Unknown time  . linaclotide (LINZESS) 145 MCG CAPS capsule Take 145 mcg by mouth daily before breakfast.   11/08/2017 at Unknown time  . magnesium oxide (MAG-OX) 400 MG tablet Take 400 mg by mouth daily.   11/08/2017 at Unknown time  . metoCLOPramide (REGLAN) 5 MG tablet Take 5 mg by mouth 4 (four) times daily.   11/08/2017 at Unknown time  . metoprolol succinate (TOPROL-XL) 100 MG 24 hr tablet Take 100 mg by mouth daily. Take with or immediately following a meal.   11/08/2017 at 930a  . mirtazapine (REMERON) 15 MG tablet Take 15 mg by  mouth at bedtime.   11/07/2017 at Unknown time  . OxyCODONE HCl, Abuse Deter, (OXAYDO) 5 MG TABA Take 5 mg by mouth every 12 (twelve) hours as needed for pain.    11/08/2017 at morning  . PARoxetine (PAXIL) 20 MG tablet Take 20 mg by mouth daily.   11/08/2017 at Unknown time  . rOPINIRole (REQUIP) 1 MG tablet Take 2 mg by mouth daily. BETWEEN 7 PM AND 8 PM.    11/08/2017 at Unknown time  . sodium bicarbonate 650 MG tablet Take 650 mg by mouth 4 (four) times daily.   11/08/2017 at Unknown time    Scheduled: .  stroke: mapping our early stages of recovery book   Does not apply Once  . sodium chloride   Intravenous Once  . Chlorhexidine Gluconate Cloth  6 each Topical Q0600  . darbepoetin (ARANESP) injection - DIALYSIS  60 mcg Intravenous Q Tue-HD  . feeding supplement (NEPRO CARB STEADY)  237 mL Oral BID BM  . heparin injection (subcutaneous)  5,000 Units Subcutaneous Q8H  . insulin aspart  0-9 Units Subcutaneous TID WC  . insulin glargine  4 Units Subcutaneous Daily  . topiramate  25 mg Oral BID   Continuous: . sodium chloride Stopped (11/11/17 1911)  . sodium chloride 100 mL/hr at 11/16/17 0623  . cefTRIAXone (ROCEPHIN)  IV 2 g (11/16/17 2321)  . levETIRAcetam 1,000 mg (11/17/17 0656)  . levETIRAcetam    . phenytoin (DILANTIN) IV      Pertinent Labs/Diagnostics: Dilantin level pending A1c pending Lipid panel pending Echocardiogram scheduled     Etta Quill PA-C [facility] [phone]  I have seen the patient reviewed the note.  I do not see any twitching today.  Assessment: 62 year old female with breakthrough seizure after starting dialysis.  Patient had 2 days of left thigh twitching which was concerning for partial status epilepticus.  She also had evidence of a hypoperfusion event on her MRI, I do think it would be reasonable to get vascular imaging because if there was evidence of large vessel stenosis that may need antiplatelet therapy.  Recommendations: -Pharmacy consult for Dilantin dosing -Continue Topamax 25 twice daily for 1 week and then 50 twice daily -Continue Keppra renally dose with 1 g daily and 500 mg after dialysis -MRA head, Dopplers  Roland Rack, MD [facility] [phone]  If 7pm- 7am, please page neurology on call as listed in [place].   11/17/2017, 9:36 AM

## 2017-11-17 NOTE — Progress Notes (Signed)
Spoke with Estill Bamberg RN requested that she contact Nephrologist to request use of pigtail for infusions and lab draws. VU. Fran Lowes, RN VAST

## 2017-11-17 NOTE — Procedures (Signed)
Pre-procedure Diagnosis: ESRD Post-procedure Diagnosis: Same  Successful placement of a non-tunneled HD catheter with tips terminating within the right atrium.    Complications: None Immediate  EBL: Minimal   The catheter is ready for immediate use.   Ronny Bacon, MD Pager #: [phone]

## 2017-11-17 NOTE — Progress Notes (Addendum)
RN called into room by Lois Huxley NT, who was concerned that patient had head thrusted back and not speaking to her.  After checking her mouth for pocketing, the NT noticed the patient had snuff in her mouth which was concerning due to her diet being downgraded today.  The patient had frequent coughing today after eating, speech therapy reevaluated her and assessed her as high aspiration risk.  RN suctioned out what appeared to be "snuff" like material from inside patients lip and back of mouth/throat area.  Patient did not cough while being suctioned and showed me her tongue which I wiped clean of debrie.  Pt was left sitting up with door open but no distress was noted.  Will continue to monitor patient.

## 2017-11-17 NOTE — Progress Notes (Signed)
Reviewed orders regarding use of new HD catheter. No order to use for lab draw or infusion. Estill Bamberg RN will contact MD to clarify usage. Fran Lowes, RN VAST

## 2017-11-17 NOTE — Evaluation (Signed)
Occupational Therapy Evaluation Patient Details Name: Erica [name] MRN: [id number] DOB: 04-04-1955 Today's Date: 11/17/2017    History of Present Illness Pt is a 62 y/o female admitted secondary to acute on chronic kidney disease, complicated by pertinent comorbidities including DM with history of DKA, HTN, HLD, CKD, cardiac murmur, chronic diarrhea, nephrolithiasis with history of pyelonephritis, gastroparesis with GERD and a history of recurrent alcohol-associated pancreatitis, and a history of major depression disorder. Pt was started on dialysis and then developed L facial, L UE and L LE twitching. Pt found to have had a seizure.   Clinical Impression   PTA patient reports independent with ADLs and mobility, but no family present to confirm history.  Patient currently presents for above and is limited by below (see problem list).  She does not recall starting dialysis, oriented to person and place only.  Eval limited due to large loose BM requiring +2 max assist for bed mobility and total assist for toileting; then transport arriving for testing.  Pt will benefit from continued OT services while admitted to optimize independence with ADLs and mobility, and recommend continued OT services at SNF level at dc.  Will continue to follow.     Follow Up Recommendations  SNF;Supervision/Assistance - 24 hour    Equipment Recommendations  Other (comment)(TBD at next venue of care)    Recommendations for Other Services       Precautions / Restrictions Precautions Precautions: Fall;Other (comment) Precaution Comments: seizure Restrictions Weight Bearing Restrictions: No      Mobility Bed Mobility Overal bed mobility: Needs Assistance Bed Mobility: Rolling Rolling: Max assist;+2 for physical assistance         General bed mobility comments: increased time and effort, verbal and tactile cueing for technique, rolled bilaterally multiple times for pericare as pt had a large liquid BM  upon arrival. Assist at bilateral LEs and trunk, pt able to use UEs on bed rails to assist  Transfers                 General transfer comment: deferred as transport present to take pt for testing    Balance Overall balance assessment: Needs assistance                                         ADL either performed or assessed with clinical judgement   ADL Overall ADL's : Needs assistance/impaired     Grooming: Moderate assistance;Bed level   Upper Body Bathing: Moderate assistance;Bed level   Lower Body Bathing: Total assistance;+2 for physical assistance;Bed level   Upper Body Dressing : Maximal assistance;Bed level   Lower Body Dressing: Total assistance;+2 for physical assistance;Bed level     Toilet Transfer Details (indicate cue type and reason): deferred  Toileting- Clothing Manipulation and Hygiene: Total assistance;+2 for physical assistance;Bed level Toileting - Clothing Manipulation Details (indicate cue type and reason): incontient of bowel required +2 to clean     Functional mobility during ADLs: Maximal assistance;+2 for physical assistance General ADL Comments: bed mobility only, limited eval due to transport      Vision Baseline Vision/History: Wears glasses Wears Glasses: At all times Vision Assessment?: Yes Eye Alignment: Within Functional Limits Ocular Range of Motion: Within Functional Limits Tracking/Visual Pursuits: Able to track stimulus in all quads without difficulty Additional Comments: further assessment      Perception     Praxis  Pertinent Vitals/Pain Pain Assessment: No/denies pain     Hand Dominance     Extremity/Trunk Assessment Upper Extremity Assessment Upper Extremity Assessment: Generalized weakness;RUE deficits/detail;LUE deficits/detail RUE Deficits / Details: grossly 4/5 (limited assessmet ) LUE Deficits / Details: grossly 3+/5 (limited assessment )   Lower Extremity Assessment Lower  Extremity Assessment: Defer to PT evaluation       Communication Communication Communication: No difficulties   Cognition Arousal/Alertness: Awake/alert Behavior During Therapy: Flat affect Overall Cognitive Status: Impaired/Different from baseline Area of Impairment: Orientation;Memory;Following commands;Safety/judgement;Problem solving;Awareness                 Orientation Level: Disoriented to;Time;Situation   Memory: Decreased short-term memory Following Commands: Follows one step commands with increased time Safety/Judgement: Decreased awareness of deficits;Decreased awareness of safety Awareness: Intellectual Problem Solving: Slow processing;Difficulty sequencing;Decreased initiation;Requires verbal cues;Requires tactile cues     General Comments       Exercises     Shoulder Instructions      Home Living Family/patient expects to be discharged to:: Private residence Living Arrangements: Children Available Help at Discharge: Family   Home Access: Level entry     Home Layout: One level     Bathroom Shower/Tub: Walk-in shower         Home Equipment: Environmental consultant - 2 wheels;Cane - single point;Shower seat          Prior Functioning/Environment Level of Independence: Independent        Comments: per pt independent with ADls and mobility        OT Problem List: Decreased strength;Decreased activity tolerance;Impaired balance (sitting and/or standing);Decreased coordination;Decreased cognition;Decreased safety awareness;Decreased knowledge of use of DME or AE;Decreased knowledge of precautions      OT Treatment/Interventions: Self-care/ADL training;Therapeutic exercise;Neuromuscular education;Energy conservation;DME and/or AE instruction;Patient/family education;Balance training;Therapeutic activities    OT Goals(Current goals can be found in the care plan section) Acute Rehab OT Goals Patient Stated Goal: to eat OT Goal Formulation: With  patient Time For Goal Achievement: 12/01/17 Potential to Achieve Goals: Fair  OT Frequency: Min 2X/week   Barriers to D/C:            Co-evaluation PT/OT/SLP Co-Evaluation/Treatment: Yes Reason for Co-Treatment: For patient/therapist safety;To address functional/ADL transfers PT goals addressed during session: Mobility/safety with mobility;Strengthening/ROM OT goals addressed during session: ADL's and self-care      AM-PAC PT "6 Clicks" Daily Activity     Outcome Measure Help from another person eating meals?: Total Help from another person taking care of personal grooming?: A Lot Help from another person toileting, which includes using toliet, bedpan, or urinal?: Total Help from another person bathing (including washing, rinsing, drying)?: A Lot Help from another person to put on and taking off regular upper body clothing?: A Lot Help from another person to put on and taking off regular lower body clothing?: Total 6 Click Score: 9   End of Session Nurse Communication: Mobility status  Activity Tolerance: Patient tolerated treatment well Patient left: in bed;with call bell/phone within reach;with bed alarm set  OT Visit Diagnosis: Other abnormalities of gait and mobility (R26.89);Muscle weakness (generalized) (M62.81);Other symptoms and signs involving cognitive function                Time: 1062-6948 OT Time Calculation (min): 32 min Charges:  OT General Charges $OT Visit: 1 Visit OT Evaluation $OT Eval Moderate Complexity: [place], OT Acute Rehabilitation Services Pager [phone] Office [phone]  Delight Stare 11/17/2017, 12:40 PM

## 2017-11-17 NOTE — Progress Notes (Addendum)
Was not able to set up purewick canister this evening due to high aspiration risk and needing suction line for suctioning airway clearance.  I understand that strict I/O's are being requested;however, we only have capability to provide one suction line on this unit and nursing judgement was provide clear airway.

## 2017-11-17 NOTE — Progress Notes (Signed)
SLP Cancellation Note  Patient Details Name: Erica [name] MRN: [id number] DOB: June 09, 1955   Cancelled treatment:       Reason Eval/Treat Not Completed: Patient at procedure or test/unavailable  Gabriel Rainwater MA, CCC-SLP   [name] 11/17/2017, 10:51 AM

## 2017-11-17 NOTE — Progress Notes (Signed)
Called nephrologist on call to request the use of pigtails for IV infusions and lab draws  Received verbal order with read back that is okay to use pigtails for IV fluids and lab draws from Dr. Moshe Cipro

## 2017-11-17 NOTE — Progress Notes (Addendum)
[facility] PROGRESS NOTE  Erica [name] HYI:[id number] DOB: Jan 13, 1956 DOA: 11/08/2017  PCP: Octavio Graves, DO  Brief History/Interval Summary: 62 year old African-American female with past medical history of diabetes mellitus type 2, essential hypertension, history of seizure disorder presented with 2 to 3 days of confusion. The patient was also complaining of lower abd pain with dysuria.The patient was recently hospitalized at [facility] from 10/07/2017 through 10/21/2017 during which she was treated for emphysematous pyelonephritis and acute on chronic renal failure. She also was treated for DKA and fluid overload.She was noted to have hemoglobin A1c of 12.1. She required transfusion 2 u PRBC without evidence of active bleeding.She underwent bilateral ureteral stenting on 10/07/2017. She was treated appropriately with antibiotics. She was discharged with instructions for outpatient cystoscopy and removal of stents in 2 to 3 weeks. Upon thisadmission, the patient has serum creatinine of 8.1. At the time of discharge, her serum creatinine has stabilized between 3.7-3.9.Nephrology was consulted to assist with management. Urology was also consulted due to retained stents--they are currently planning to remove stents. Patient had seizures 11/14/2017, she was transferred to [facility] for further work-up , her MRI brain significant for scattered infarcts, as well her seizures has been managed by neurology, hemodialysis has been continued during this hospital stay.   Consultants: Nephrology.  Urology.  Neurology.  Procedures:   Antibiotics: Ceftriaxone  Subjective/Interval History: Patient pleasantly confused.  Denies any pain.  No shortness of breath.  ROS: Denies any abdominal pain.  No nausea or vomiting  Objective:  Vital Signs  Vitals:   11/16/17 2134 11/17/17 0207 11/17/17 0344 11/17/17 0806  BP: (!) 160/85 (!) 160/77 (!) 112/54 (!) 143/74    Pulse:  (!) 115  (!) 104  Resp:  (!) 9    Temp: 98 F (36.7 C) 98.3 F (36.8 C) 98.3 F (36.8 C) 98.1 F (36.7 C)  TempSrc: Oral Oral Oral Axillary  SpO2:  100%  99%  Weight:      Height:        Intake/Output Summary (Last 24 hours) at 11/17/2017 1302 Last data filed at 11/17/2017 0300 Gross per 24 hour  Intake 855 ml  Output 500 ml  Net 355 ml   Filed Weights   11/13/17 2230 11/16/17 1505 11/16/17 1805  Weight: 50 kg 50.4 kg 50 kg    General appearance: alert, cooperative, appears stated age and no distress Head: Normocephalic, without obvious abnormality, atraumatic Resp: clear to auscultation bilaterally Cardio: regular rate and rhythm, S1, S2 normal, no murmur, click, rub or gallop GI: soft, non-tender; bowel sounds normal; no masses,  no organomegaly Extremities: extremities normal, atraumatic, no cyanosis or edema Pulses: 2+ and symmetric Neurologic: Left hemiparesis is noted.  Otherwise she is alert.  Disoriented.  Lab Results:  Data Reviewed: I have personally reviewed following labs and imaging studies  CBC: Recent Labs  Lab 11/14/17 0656 11/14/17 1045 11/14/17 1615 11/15/17 0807 11/16/17 0514  WBC 11.9* 12.5* 10.7* 10.3 14.9*  HGB 7.6* 7.8* 7.0* 7.7* 7.2*  HCT 24.1* 25.2* 22.9* 25.9* 23.7*  MCV 88.6 88.7 89.8 91.8 92.2  PLT 259 282 258 242 786    Basic Metabolic Panel: Recent Labs  Lab 11/13/17 0824  11/14/17 0656 11/14/17 1045 11/14/17 1615 11/15/17 0807 11/15/17 0924 11/16/17 0514  NA 135   < > 138 138 135 139 137 138  K 3.0*   < > 3.6 3.7 3.6 3.7 3.6 3.6  CL 93*   < > 97* 96*  99 103 101 105  CO2 30   < > _0 19*  GLUCOSE 130*   < > 80 81 125* 93 88 202*  BUN 39*   < > _1 24*  CREATININE 6.55*   < > 4.57* 4.81* 4.81* 5.08* 5.06* 5.48*  CALCIUM 5.9*   < > 7.1* 7.4* 7.0* 7.6* 7.4* 7.2*  PHOS 3.5  --  3.6  --  4.4 4.4  --  4.2   < > = values in this interval not displayed.    GFR: Estimated Creatinine  Clearance: 7.5 mL/min (A) (by C-G formula based on SCr of 5.48 mg/dL (H)).  Liver Function Tests: Recent Labs  Lab 11/14/17 0656 11/14/17 1615 11/15/17 0807 11/15/17 0924 11/16/17 0514  AST  --   --   --  24  --   ALT  --   --   --  12  --   ALKPHOS  --   --   --  69  --   BILITOT  --   --   --  0.9  --   PROT  --   --   --  5.8*  --   ALBUMIN 2.0* 1.8* 1.9* 1.9* 1.8*    Coagulation Profile: Recent Labs  Lab 11/14/17 1045  INR 1.47    Cardiac Enzymes: Recent Labs  Lab 11/14/17 1045  TROPONINI <0.03    CBG: Recent Labs  Lab 11/15/17 2241 11/16/17 0650 11/16/17 1126 11/16/17 2225 11/17/17 0637  GLUCAP 202* 239* 110* 129* 81    Anemia Panel: Recent Labs    11/15/17 0807  FERRITIN 383*  TIBC 160*  IRON 47    Recent Results (from the past 240 hour(s))  Urine culture     Status: Abnormal   Collection Time: 11/08/17  8:07 PM  Result Value Ref Range Status   Specimen Description   Final    URINE, RANDOM Performed at [facility], [address], [place], [place] [address]    Special Requests   Final    NONE Performed at [facility], [address], [place], [place] [address]    Culture >=100,000 COLONIES/mL ESCHERICHIA COLI (A)  Final   Report Status 11/11/2017 FINAL  Final   Organism ID, Bacteria ESCHERICHIA COLI (A)  Final      Susceptibility   Escherichia coli - MIC*    AMPICILLIN >=32 RESISTANT Resistant     CEFAZOLIN <=4 SENSITIVE Sensitive     CEFTRIAXONE <=1 SENSITIVE Sensitive     CIPROFLOXACIN <=0.25 SENSITIVE Sensitive     GENTAMICIN <=1 SENSITIVE Sensitive     IMIPENEM <=0.25 SENSITIVE Sensitive     NITROFURANTOIN <=16 SENSITIVE Sensitive     TRIMETH/SULFA <=20 SENSITIVE Sensitive     AMPICILLIN/SULBACTAM 16 INTERMEDIATE Intermediate     PIP/TAZO <=4 SENSITIVE Sensitive     Extended ESBL NEGATIVE Sensitive     * >=100,000 COLONIES/mL ESCHERICHIA COLI  C difficile quick scan w PCR reflex     Status: None   Collection Time: 11/13/17  11:17 PM  Result Value Ref Range Status   C Diff antigen NEGATIVE NEGATIVE Final   C Diff toxin NEGATIVE NEGATIVE Final   C Diff interpretation No C. difficile detected.  Final    Comment: VALID Performed at [facility], [address], [place], [place] [address]   MRSA PCR Screening     Status: None   Collection Time: 11/14/17  5:26 AM  Result Value  Ref Range Status   MRSA by PCR NEGATIVE NEGATIVE Final    Comment:        The GeneXpert MRSA Assay (FDA approved for NASAL specimens only), is one component of a comprehensive MRSA colonization surveillance program. It is not intended to diagnose MRSA infection nor to guide or monitor treatment for MRSA infections. Performed at [facility], [place] [address] [place], [place] [address]   Culture, blood (single)     Status: None (Preliminary result)   Collection Time: 11/16/17  5:10 PM  Result Value Ref Range Status   Specimen Description BLOOD CENTRAL LINE  Final   Special Requests   Final    BOTTLES DRAWN AEROBIC AND ANAEROBIC Blood Culture results may not be optimal due to an excessive volume of blood received in culture bottles   Culture   Final    NO GROWTH < 12 HOURS Performed at [facility] [address], [place], [place] [address]    Report Status PENDING  Incomplete      Radiology Studies: Mr Brain Wo Contrast  Result Date: 11/15/2017 CLINICAL DATA:  Acute presentation with left-sided weakness and facial droop. EXAM: MRI HEAD WITHOUT CONTRAST TECHNIQUE: Multiplanar, multiecho pulse sequences of the brain and surrounding structures were obtained without intravenous contrast. COMPARISON:  CT 11/14/2017.  MRI 03/01/2008. FINDINGS: Brain: There are bilaterally symmetric punctate foci restricted diffusion indicating acute infarction in the deep white matter at the frontal parietal vertices. This most likely relates to watershed infarction, though embolic infarction is not completely excluded. No cortical or large  vessel territory infarction. No mass lesion, hemorrhage, hydrocephalus or extra-axial collection. There are extensive chronic small-vessel ischemic changes of the pons. No focal cerebellar insult. Cerebral hemispheres elsewhere show scattered foci of small-vessel change within the white matter. No evidence of mass lesion, hemorrhage, hydrocephalus or extra-axial collection. Vascular: Major vessels at the base of the brain show flow. Skull and upper cervical spine: Negative Sinuses/Orbits: Clear/normal Other: None IMPRESSION: Scattered punctate foci of acute infarction manifest as foci of restricted diffusion within the deep white matter at both frontal parietal vertices. 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 No evidence of cortical or large vessel territory infarction. Atrophy and chronic small-vessel ischemic changes elsewhere as described above, most prominently affecting the pons. Electronically Signed   By: Nelson Chimes M.D.   On: 11/15/2017 16:11   Ir Fluoro Guide Cv Line Right  Result Date: 11/17/2017 INDICATION: End-stage renal disease. Please perform image guided placement of a temporary dialysis catheter for the initiation dialysis. EXAM: NON-TUNNELED CENTRAL VENOUS HEMODIALYSIS CATHETER PLACEMENT WITH ULTRASOUND AND FLUOROSCOPIC GUIDANCE COMPARISON:  None. MEDICATIONS: None FLUOROSCOPY TIME:  18 seconds (2 mGy) COMPLICATIONS: None immediate. PROCEDURE: Informed written consent was obtained from the patient after a discussion of the risks, benefits, and alternatives to treatment. Questions regarding the procedure were encouraged and answered. The right neck and chest were prepped with chlorhexidine in a sterile fashion, and a sterile drape was applied covering the operative field. Maximum barrier sterile technique with sterile gowns and gloves were used for the procedure. A timeout was performed prior to the initiation of  the procedure. After the overlying soft tissues were anesthetized, a small venotomy incision was created and a micropuncture kit was utilized to access the internal jugular vein. Real-time ultrasound guidance was utilized for vascular access including the acquisition of a permanent ultrasound image documenting patency of the accessed vessel. The microwire  was utilized to measure appropriate catheter length. A stiff glidewire was advanced to the level of the IVC. Under fluoroscopic guidance, the venotomy was serially dilated, ultimately allowing placement of a 20 cm temporary Trialysis catheter with tip ultimately terminating within the superior aspect of the right atrium. Final catheter positioning was confirmed and documented with a spot radiographic image. The catheter aspirates and flushes normally. The catheter was flushed with appropriate volume heparin dwells. The catheter exit site was secured with a 0-Prolene retention suture. A dressing was placed. The patient tolerated the procedure well without immediate post procedural complication. IMPRESSION: Successful placement of a right internal jugular approach 20 cm temporary dialysis catheter with tip terminating with in the superior aspect of the right atrium. The catheter is ready for immediate use. PLAN: This catheter may be converted to a tunneled dialysis catheter at a later date as indicated. Electronically Signed   By: Sandi Mariscal M.D.   On: 11/17/2017 12:06   Ir US Guide Vasc Access Right  Result Date: 11/17/2017 INDICATION: End-stage renal disease. Please perform image guided placement of a temporary dialysis catheter for the initiation dialysis. EXAM: NON-TUNNELED CENTRAL VENOUS HEMODIALYSIS CATHETER PLACEMENT WITH ULTRASOUND AND FLUOROSCOPIC GUIDANCE COMPARISON:  None. MEDICATIONS: None FLUOROSCOPY TIME:  18 seconds (2 mGy) COMPLICATIONS: None immediate. PROCEDURE: Informed written consent was obtained from the patient after a discussion of the  risks, benefits, and alternatives to treatment. Questions regarding the procedure were encouraged and answered. The right neck and chest were prepped with chlorhexidine in a sterile fashion, and a sterile drape was applied covering the operative field. Maximum barrier sterile technique with sterile gowns and gloves were used for the procedure. A timeout was performed prior to the initiation of the procedure. After the overlying soft tissues were anesthetized, a small venotomy incision was created and a micropuncture kit was utilized to access the internal jugular vein. Real-time ultrasound guidance was utilized for vascular access including the acquisition of a permanent ultrasound image documenting patency of the accessed vessel. The microwire was utilized to measure appropriate catheter length. A stiff glidewire was advanced to the level of the IVC. Under fluoroscopic guidance, the venotomy was serially dilated, ultimately allowing placement of a 20 cm temporary Trialysis catheter with tip ultimately terminating within the superior aspect of the right atrium. Final catheter positioning was confirmed and documented with a spot radiographic image. The catheter aspirates and flushes normally. The catheter was flushed with appropriate volume heparin dwells. The catheter exit site was secured with a 0-Prolene retention suture. A dressing was placed. The patient tolerated the procedure well without immediate post procedural complication. IMPRESSION: Successful placement of a right internal jugular approach 20 cm temporary dialysis catheter with tip terminating with in the superior aspect of the right atrium. The catheter is ready for immediate use. PLAN: This catheter may be converted to a tunneled dialysis catheter at a later date as indicated. Electronically Signed   By: Sandi Mariscal M.D.   On: 11/17/2017 12:06     Medications:  Scheduled: .  stroke: mapping our early stages of recovery book   Does not apply Once   . sodium chloride   Intravenous Once  . Chlorhexidine Gluconate Cloth  6 each Topical Q0600  . darbepoetin (ARANESP) injection - DIALYSIS  60 mcg Intravenous Q Tue-HD  . feeding supplement (NEPRO CARB STEADY)  237 mL Oral BID BM  . heparin      . heparin injection (subcutaneous)  5,000 Units Subcutaneous Q8H  .  insulin aspart  0-9 Units Subcutaneous TID WC  . insulin glargine  4 Units Subcutaneous Daily  . lidocaine      . topiramate  25 mg Oral BID   Continuous: . sodium chloride Stopped (11/11/17 1911)  . sodium chloride 100 mL/hr at 11/16/17 0623  . cefTRIAXone (ROCEPHIN)  IV 2 g (11/16/17 2321)  . levETIRAcetam 1,000 mg (11/17/17 0656)  . levETIRAcetam    . phenytoin (DILANTIN) IV     WUJ:WJXBJY chloride, acetaminophen **OR** acetaminophen, heparin, levETIRAcetam, lidocaine, ondansetron **OR** ondansetron (ZOFRAN) IV, RESOURCE THICKENUP CLEAR, senna-docusate  Assessment/Plan:    Acute on chronic renal failure--stage unspecified presently -etiology likely infection and hemodynamic changes/ATN -Nephrology is following.  Patient is being dialyzed.   -Long-term plan is not entirely clear yet. -Non-tunneled HD catheter placed by interventional radiology.  Pyelonephritis -11/08/17 urine culture = Ecoli -Continue ceftriaxone -11/08/2017 CT renal protocol--bilateral ureteral stents without hydronephrosis. Small bilateral renal calculi. Small amount of ascites with anasarca. Thickened stomach may be related to ascites. -ureteral stents initially placed 10/07/17 -Discussed with Dr. Jeffie Pollock 11/15/2017, apparently plan is for stent removal during stay in the hospital.  Urology has not seen the patient yet.  Seizure disorder/Left Hemiparesis -Seizures appear to be new onset after dialysis.  Patient was started on antiepileptics.  Noted to be on Dilantin and Keppra and Topamax.  No further twitching episodes noted.  Acute CVA  -MRI significant for watershed infarction at both  frontal and parietal varices.  Left-sided deficits noted.  PT and OT evaluation.  Diabetes mellitus type 2, uncontrolled with hyperglycemia -Monitor CBGs.  SSI. -10/07/17 A1C = 12.1  Essential hypertension Patient was taken off of amlodipine and metoprolol due to soft blood pressures.  Blood pressure noted to be elevated the last 2 days.  Continue to monitor for now.  Metabolic acidosis -Secondary to acute on chronic renal failure -initially on bicarbonate drip per nephrology -now improved on HD.  Continue to monitor.  Diarrhea Negative C. difficile.  Imodium as needed.  Severe protein calorie malnutrition -continue supplements  Normocytic Anemia -Hemoglobin  was around 9 during recent hospital stay in [place], and was around 9 on admission, slowly drifting down.  Patient was transfused 1 unit of PRBC on 10/1.  Recheck hemoglobin tomorrow. -Anemia of chronic blood loss, and chronic illness and kidney disease, receiving Procrit with dialysis, at this point no IV iron in the setting of her severe infection from pyelonephritis. -Hemoccult positive, this is most likely in the setting diarrhea, as she has Flexi-Seal with green-colored stool in the bag, no significant evidence of GI bleed, continue Protonix 40 twice daily.  DVT Prophylaxis: Subcutaneous heparin    Code Status: Full code Family Communication: No family at bedside Disposition Plan: Management as outlined above.  Wait for acute medical issues to improve.  PT is recommending skilled nursing facility for rehab.    LOS: 9 days   [facility] Pager [phone] 11/17/2017, 1:02 PM  If 7PM-7AM, please contact night-coverage at [email or web address], password [facility]

## 2017-11-17 NOTE — Progress Notes (Signed)
[facility] Progress Note   Subjective:   Eating a full breakfast this AM.  More awake and alert.  On for HD cath in IR today.  Tolerated HD yesterday. Due to fever (T 100) HD cath removed yesterday. No further fever.  Blood culture obtained during HD NGTD < 12h.  Objective Vitals:   11/16/17 2134 11/17/17 0207 11/17/17 0344 11/17/17 0806  BP: (!) 160/85 (!) 160/77 (!) 112/54 (!) 143/74  Pulse:  (!) 115  (!) 104  Resp:  (!) 9    Temp: 98 F (36.7 C) 98.3 F (36.8 C) 98.3 F (36.8 C) 98.1 F (36.7 C)  TempSrc: Oral Oral Oral Axillary  SpO2:  100%  99%  Weight:      Height:       Physical Exam General: sitting up eating breakfast Heart: RRR, no rub Lungs: normal WOB, clear anteriorly Abdomen: soft, nontender Extremities: trace LE edema GU: no foley Dialysis Access:  None currently  Additional Objective Labs: Basic Metabolic Panel: Recent Labs  Lab 11/14/17 1615 11/15/17 0807 11/15/17 0924 11/16/17 0514  NA 135 139 137 138  K 3.6 3.7 3.6 3.6  CL 99 103 101 105  CO2 23 24 22  19*  GLUCOSE 125* 93 88 202*  BUN 22 23 23  24*  CREATININE 4.81* 5.08* 5.06* 5.48*  CALCIUM 7.0* 7.6* 7.4* 7.2*  PHOS 4.4 4.4  --  4.2   Liver Function Tests: Recent Labs  Lab 11/15/17 0807 11/15/17 0924 11/16/17 0514  AST  --  24  --   ALT  --  12  --   ALKPHOS  --  69  --   BILITOT  --  0.9  --   PROT  --  5.8*  --   ALBUMIN 1.9* 1.9* 1.8*   No results for input(s): LIPASE, AMYLASE in the last 168 hours. CBC: Recent Labs  Lab 11/14/17 0656 11/14/17 1045 11/14/17 1615 11/15/17 0807 11/16/17 0514  WBC 11.9* 12.5* 10.7* 10.3 14.9*  HGB 7.6* 7.8* 7.0* 7.7* 7.2*  HCT 24.1* 25.2* 22.9* 25.9* 23.7*  MCV 88.6 88.7 89.8 91.8 92.2  PLT 259 282 258 242 259   Blood Culture    Component Value Date/Time   SDES BLOOD CENTRAL LINE 11/16/2017 1710   SPECREQUEST  11/16/2017 1710    BOTTLES DRAWN AEROBIC AND ANAEROBIC Blood Culture results may not be optimal due to  an excessive volume of blood received in culture bottles   CULT  11/16/2017 1710    NO GROWTH < 12 HOURS Performed at [facility] [address] [place], [place] [address]    REPTSTATUS PENDING 11/16/2017 1710    Cardiac Enzymes: Recent Labs  Lab 11/14/17 1045  TROPONINI <0.03   CBG: Recent Labs  Lab 11/15/17 2241 11/16/17 0650 11/16/17 1126 11/16/17 2225 11/17/17 0637  GLUCAP 202* 239* 110* 129* 81   Iron Studies:  Recent Labs    11/15/17 0807  IRON 47  TIBC 160*  FERRITIN 383*   @[email or web address] @ Studies/Results: Mr Brain Wo Contrast  Result Date: 11/15/2017 CLINICAL DATA:  Acute presentation with left-sided weakness and facial droop. EXAM: MRI HEAD WITHOUT CONTRAST TECHNIQUE: Multiplanar, multiecho pulse sequences of the brain and surrounding structures were obtained without intravenous contrast. COMPARISON:  CT 11/14/2017.  MRI 03/01/2008. FINDINGS: Brain: There are bilaterally symmetric punctate foci restricted diffusion indicating acute infarction in the deep white matter at the frontal parietal vertices. This most likely relates to watershed infarction, though embolic infarction is not  completely excluded. No cortical or large vessel territory infarction. No mass lesion, hemorrhage, hydrocephalus or extra-axial collection. There are extensive chronic small-vessel ischemic changes of the pons. No focal cerebellar insult. Cerebral hemispheres elsewhere show scattered foci of small-vessel change within the white matter. No evidence of mass lesion, hemorrhage, hydrocephalus or extra-axial collection. Vascular: Major vessels at the base of the brain show flow. Skull and upper cervical spine: Negative Sinuses/Orbits: Clear/normal Other: None IMPRESSION: Scattered punctate foci of acute infarction manifest as foci of restricted diffusion within the deep white matter at both frontal parietal vertices. 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 No evidence of cortical or large vessel territory infarction. Atrophy and chronic small-vessel ischemic changes elsewhere as described above, most prominently affecting the pons. Electronically Signed   By: Nelson Chimes M.D.   On: 11/15/2017 16:11   Ir Fluoro Guide Cv Line Right  Result Date: 11/17/2017 INDICATION: End-stage renal disease. Please perform image guided placement of a temporary dialysis catheter for the initiation dialysis. EXAM: NON-TUNNELED CENTRAL VENOUS HEMODIALYSIS CATHETER PLACEMENT WITH ULTRASOUND AND FLUOROSCOPIC GUIDANCE COMPARISON:  None. MEDICATIONS: None FLUOROSCOPY TIME:  18 seconds (2 mGy) COMPLICATIONS: None immediate. PROCEDURE: Informed written consent was obtained from the patient after a discussion of the risks, benefits, and alternatives to treatment. Questions regarding the procedure were encouraged and answered. The right neck and chest were prepped with chlorhexidine in a sterile fashion, and a sterile drape was applied covering the operative field. Maximum barrier sterile technique with sterile gowns and gloves were used for the procedure. A timeout was performed prior to the initiation of the procedure. After the overlying soft tissues were anesthetized, a small venotomy incision was created and a micropuncture kit was utilized to access the internal jugular vein. Real-time ultrasound guidance was utilized for vascular access including the acquisition of a permanent ultrasound image documenting patency of the accessed vessel. The microwire was utilized to measure appropriate catheter length. A stiff glidewire was advanced to the level of the IVC. Under fluoroscopic guidance, the venotomy was serially dilated, ultimately allowing placement of a 20 cm temporary Trialysis catheter with tip ultimately terminating within the superior aspect of the right atrium. Final catheter positioning was confirmed and documented with a spot  radiographic image. The catheter aspirates and flushes normally. The catheter was flushed with appropriate volume heparin dwells. The catheter exit site was secured with a 0-Prolene retention suture. A dressing was placed. The patient tolerated the procedure well without immediate post procedural complication. IMPRESSION: Successful placement of a right internal jugular approach 20 cm temporary dialysis catheter with tip terminating with in the superior aspect of the right atrium. The catheter is ready for immediate use. PLAN: This catheter may be converted to a tunneled dialysis catheter at a later date as indicated. Electronically Signed   By: Sandi Mariscal M.D.   On: 11/17/2017 12:06   Ir US Guide Vasc Access Right  Result Date: 11/17/2017 INDICATION: End-stage renal disease. Please perform image guided placement of a temporary dialysis catheter for the initiation dialysis. EXAM: NON-TUNNELED CENTRAL VENOUS HEMODIALYSIS CATHETER PLACEMENT WITH ULTRASOUND AND FLUOROSCOPIC GUIDANCE COMPARISON:  None. MEDICATIONS: None FLUOROSCOPY TIME:  18 seconds (2 mGy) COMPLICATIONS: None immediate. PROCEDURE: Informed written consent was obtained from the patient after a discussion of the risks, benefits, and alternatives to treatment. Questions regarding the procedure were encouraged and answered. The right neck and chest were prepped with chlorhexidine in a sterile fashion, and a  sterile drape was applied covering the operative field. Maximum barrier sterile technique with sterile gowns and gloves were used for the procedure. A timeout was performed prior to the initiation of the procedure. After the overlying soft tissues were anesthetized, a small venotomy incision was created and a micropuncture kit was utilized to access the internal jugular vein. Real-time ultrasound guidance was utilized for vascular access including the acquisition of a permanent ultrasound image documenting patency of the accessed vessel. The  microwire was utilized to measure appropriate catheter length. A stiff glidewire was advanced to the level of the IVC. Under fluoroscopic guidance, the venotomy was serially dilated, ultimately allowing placement of a 20 cm temporary Trialysis catheter with tip ultimately terminating within the superior aspect of the right atrium. Final catheter positioning was confirmed and documented with a spot radiographic image. The catheter aspirates and flushes normally. The catheter was flushed with appropriate volume heparin dwells. The catheter exit site was secured with a 0-Prolene retention suture. A dressing was placed. The patient tolerated the procedure well without immediate post procedural complication. IMPRESSION: Successful placement of a right internal jugular approach 20 cm temporary dialysis catheter with tip terminating with in the superior aspect of the right atrium. The catheter is ready for immediate use. PLAN: This catheter may be converted to a tunneled dialysis catheter at a later date as indicated. Electronically Signed   By: Sandi Mariscal M.D.   On: 11/17/2017 12:06   Medications: . sodium chloride Stopped (11/11/17 1911)  . sodium chloride 100 mL/hr at 11/16/17 0623  . cefTRIAXone (ROCEPHIN)  IV 2 g (11/16/17 2321)  . levETIRAcetam 1,000 mg (11/17/17 0656)  . levETIRAcetam    . phenytoin (DILANTIN) IV     .  stroke: mapping our early stages of recovery book   Does not apply Once  . sodium chloride   Intravenous Once  . Chlorhexidine Gluconate Cloth  6 each Topical Q0600  . darbepoetin (ARANESP) injection - DIALYSIS  60 mcg Intravenous Q Tue-HD  . feeding supplement (NEPRO CARB STEADY)  237 mL Oral BID BM  . heparin      . heparin injection (subcutaneous)  5,000 Units Subcutaneous Q8H  . insulin aspart  0-9 Units Subcutaneous TID WC  . insulin glargine  4 Units Subcutaneous Daily  . lidocaine      . topiramate  25 mg Oral BID     Assessment/Plan:  1.  AKI on CKD:  Recent course  with emphysematous pyelonephritis treated at [facility], urologic intervention double J stents placed.  During that admission Cr ~ 8 on presentation, down to 4 on discharge.  Represented 2 weeks later with Cr up to 11.  She was initially hydrated but due lack of improved had HD x 2 at [facility] prior to transfer here on 9/29. She has required ongoing HD here, last 10/1.  Continue to monitor for recovery.  We've been unable to follow I/Os due to incontinent.  Given severe GU infection recently I've been reluctant to place foley; purewick not possible as room suction needed for Oral suctioning in her.  Will need to follow labs for recovery for now.   Given fever yesterday we removed her femoral HD catheter following HD.  Blood culture pending.  IR to place IJ temporary line for now but may end up needing tunneled line.   2.   HTN:   Reasonable control for now.  BP has ranged 110-130 in past 24h.  3.  Anemia:  Hb has  trended down significantly from 10.1 on 9/23 to 7.2 and she rec'd transfusion yesterday. Iron indices 9/30 show iron replete.  Hemoccult + and started on PPI but no signs of gross bleeding.  Given aranesp 34 with HD 10/1.  Hb was in 9-10 at [facility] earlier this month.   4.  BMM: phos 4.2, corr ca normal (albumin 1.8).   5. Seizures:  H/o hypoglycemia induced seizures; neurology following here and dosing meds for dialysis at this time.  She appears to have improved since yesterday.    6.  E coli UTI: Rocephin 2g daily since 9/26.  7.  DM: per primary.   Jannifer Hick MD 11/17/2017, 1:05 PM  [facility]

## 2017-11-17 NOTE — Progress Notes (Addendum)
Notified provider on call for [facility] that patient's CVC IJ line is out 9cm.  Advised that need order to have removed   New orders received

## 2017-11-17 NOTE — Progress Notes (Signed)
Assess CVC IJ line out 9 CM. Estill Bamberg RN at [facility] and will notify MD. At this time instructed not to use it. RN VU. Fran Lowes, RN VAST

## 2017-11-17 NOTE — Progress Notes (Signed)
*  Preliminary Results* Carotid artery duplex has been completed. Bilateral internal carotid arteries are 1-39%. Vertebral arteries are patent with antegrade flow.  11/17/2017 1:11 PM  Erica [name] Dawna Part

## 2017-11-17 NOTE — Evaluation (Signed)
Physical Therapy Evaluation Patient Details Name: MIRYAM MCELHINNEY MRN: [id number] DOB: 06-15-1955 Today's Date: 11/17/2017   History of Present Illness  Pt is a 62 y/o female admitted secondary to acute on chronic kidney disease, complicated by pertinent comorbidities including DM with history of DKA, HTN, HLD, CKD, cardiac murmur, chronic diarrhea, nephrolithiasis with history of pyelonephritis, gastroparesis with GERD and a history of recurrent alcohol-associated pancreatitis, and a history of major depression disorder. Pt was started on dialysis and then developed L facial, L UE and L LE twitching. Pt found to have had a seizure.    Clinical Impression  Pt presented supine in bed with HOB elevated, awake and willing to participate in therapy session. Prior to admission, pt reported that she was independent with all functional mobility and ADLs. Pt lives with her son in a one level home with a level entry. No family/caregivers present during evaluation. Limited this session as pt had a large, liquid BM upon arrival; required max A x2 for bed mobility and total A for pericare. Transport present to take pt to a test. Pt would continue to benefit from skilled physical therapy services at this time while admitted and after d/c to address the below listed limitations in order to improve overall safety and independence with functional mobility.     Follow Up Recommendations SNF    Equipment Recommendations  None recommended by PT    Recommendations for Other Services       Precautions / Restrictions Precautions Precautions: Fall;Other (comment)(seizure) Restrictions Weight Bearing Restrictions: No      Mobility  Bed Mobility Overal bed mobility: Needs Assistance Bed Mobility: Rolling Rolling: Max assist;+2 for physical assistance         General bed mobility comments: increased time and effort, verbal and tactile cueing for technique, rolled bilaterally multiple times for pericare  as pt had a large liquid BM upon arrival. Assist at bilateral LEs and trunk, pt able to use UEs on bed rails to assist  Transfers                 General transfer comment: deferred as transport present to take pt for testing  Ambulation/Gait                Stairs            Wheelchair Mobility    Modified Rankin (Stroke Patients Only)       Balance Overall balance assessment: Needs assistance                                           Pertinent Vitals/Pain Pain Assessment: No/denies pain    Home Living Family/patient expects to be discharged to:: Private residence Living Arrangements: Children Available Help at Discharge: Family   Home Access: Level entry     Home Layout: One level Home Equipment: Environmental consultant - 2 wheels;Cane - single point;Shower seat      Prior Function Level of Independence: Independent               Hand Dominance        Extremity/Trunk Assessment   Upper Extremity Assessment Upper Extremity Assessment: Defer to OT evaluation    Lower Extremity Assessment Lower Extremity Assessment: Generalized weakness       Communication   Communication: No difficulties  Cognition Arousal/Alertness: Awake/alert Behavior During Therapy: Flat affect Overall Cognitive Status: Impaired/Different  from baseline Area of Impairment: Orientation;Memory;Following commands;Safety/judgement;Problem solving;Awareness                 Orientation Level: Disoriented to;Time;Situation   Memory: Decreased short-term memory Following Commands: Follows one step commands with increased time Safety/Judgement: Decreased awareness of deficits;Decreased awareness of safety Awareness: Intellectual Problem Solving: Slow processing;Difficulty sequencing;Decreased initiation;Requires verbal cues;Requires tactile cues        General Comments      Exercises     Assessment/Plan    PT Assessment Patient needs continued PT  services  PT Problem List Decreased strength;Decreased activity tolerance;Decreased mobility;Decreased balance;Decreased cognition;Decreased coordination;Decreased knowledge of use of DME;Decreased safety awareness;Decreased knowledge of precautions       PT Treatment Interventions DME instruction;Gait training;Stair training;Functional mobility training;Therapeutic activities;Therapeutic exercise;Balance training;Neuromuscular re-education;Cognitive remediation;Patient/family education    PT Goals (Current goals can be found in the Care Plan section)  Acute Rehab PT Goals Patient Stated Goal: to eat PT Goal Formulation: With patient Time For Goal Achievement: 12/01/17 Potential to Achieve Goals: Fair    Frequency Min 2X/week   Barriers to discharge        Co-evaluation PT/OT/SLP Co-Evaluation/Treatment: Yes Reason for Co-Treatment: For patient/therapist safety;To address functional/ADL transfers PT goals addressed during session: Mobility/safety with mobility;Strengthening/ROM         AM-PAC PT "6 Clicks" Daily Activity  Outcome Measure Difficulty turning over in bed (including adjusting bedclothes, sheets and blankets)?: Unable Difficulty moving from lying on back to sitting on the side of the bed? : Unable Difficulty sitting down on and standing up from a chair with arms (e.g., wheelchair, bedside commode, etc,.)?: Unable Help needed moving to and from a bed to chair (including a wheelchair)?: Total Help needed walking in hospital room?: Total Help needed climbing 3-5 steps with a railing? : Total 6 Click Score: 6    End of Session   Activity Tolerance: Patient limited by fatigue Patient left: in bed;with call bell/phone within reach Nurse Communication: Mobility status PT Visit Diagnosis: Other abnormalities of gait and mobility (R26.89);Muscle weakness (generalized) (M62.81)    Time: 4967-5916 PT Time Calculation (min) (ACUTE ONLY): 33 min   Charges:   PT  Evaluation $PT Eval Moderate Complexity: 1 Mod          Sherie Don, PT, DPT  Acute Rehabilitation Services Pager [phone] Office [place] 11/17/2017, 10:25 AM

## 2017-11-18 LAB — CBC
HEMATOCRIT: 28.7 % — AB (ref 36.0–46.0)
Hemoglobin: 8.6 g/dL — ABNORMAL LOW (ref 12.0–15.0)
MCH: 26.7 pg (ref 26.0–34.0)
MCHC: 30 g/dL (ref 30.0–36.0)
MCV: 89.1 fL (ref 78.0–100.0)
PLATELETS: 188 10*3/uL (ref 150–400)
RBC: 3.22 MIL/uL — ABNORMAL LOW (ref 3.87–5.11)
RDW: 19.2 % — AB (ref 11.5–15.5)
WBC: 13.5 10*3/uL — AB (ref 4.0–10.5)

## 2017-11-18 LAB — GLUCOSE, CAPILLARY
GLUCOSE-CAPILLARY: 70 mg/dL (ref 70–99)
GLUCOSE-CAPILLARY: 121 mg/dL — AB (ref 70–99)
Glucose-Capillary: 72 mg/dL (ref 70–99)
Glucose-Capillary: 133 mg/dL — ABNORMAL HIGH (ref 70–99)

## 2017-11-18 LAB — LIPID PANEL
CHOL/HDL RATIO: 3 ratio
Cholesterol: 86 mg/dL (ref 0–200)
HDL: 29 mg/dL — AB (ref >40)
LDL CALC: 40 mg/dL (ref 0–99)
Triglycerides: 85 mg/dL (ref <150)
VLDL: 17 mg/dL (ref 0–40)

## 2017-11-18 LAB — BASIC METABOLIC PANEL
Anion gap: 6 (ref 5–15)
BUN: 14 mg/dL (ref 8–23)
CO2: 26 mmol/L (ref 22–32)
Calcium: 7.5 mg/dL — ABNORMAL LOW (ref 8.9–10.3)
Chloride: 109 mmol/L (ref 98–111)
Creatinine, Ser: 3.93 mg/dL — ABNORMAL HIGH (ref 0.44–1.00)
GFR calc Af Amer: 13 mL/min — ABNORMAL LOW (ref >60)
GFR, EST NON AFRICAN AMERICAN: 11 mL/min — AB (ref >60)
Glucose, Bld: 74 mg/dL (ref 70–99)
POTASSIUM: 3.2 mmol/L — AB (ref 3.5–5.1)
SODIUM: 141 mmol/L (ref 135–145)

## 2017-11-18 MED ORDER — SACCHAROMYCES BOULARDII 250 MG PO CAPS
250.0000 mg | ORAL_CAPSULE | Freq: Two times a day (BID) | ORAL | Status: DC
  Administered 2017-11-18 – 2017-11-29 (×23): 250 mg via ORAL
  Filled 2017-11-18 (×23): qty 1

## 2017-11-18 MED ORDER — POTASSIUM CHLORIDE 20 MEQ PO PACK: 20.0000 meq | PACK | Freq: Two times a day (BID) | ORAL | Status: DC

## 2017-11-18 MED ORDER — LOPERAMIDE HCL 2 MG PO CAPS
4.0000 mg | ORAL_CAPSULE | Freq: Once | ORAL | Status: AC
  Administered 2017-11-18: 4 mg via ORAL
  Filled 2017-11-18: qty 2

## 2017-11-18 MED ORDER — INSULIN GLARGINE 100 UNIT/ML ~~LOC~~ SOLN
4.0000 [IU] | Freq: Every day | SUBCUTANEOUS | Status: DC
  Administered 2017-11-18: 4 [IU] via SUBCUTANEOUS
  Filled 2017-11-18 (×2): qty 0.04

## 2017-11-18 MED ORDER — LOPERAMIDE HCL 2 MG PO CAPS
2.0000 mg | ORAL_CAPSULE | Freq: Three times a day (TID) | ORAL | Status: DC | PRN
  Administered 2017-11-18 – 2017-11-22 (×3): 2 mg via ORAL
  Filled 2017-11-18 (×3): qty 1

## 2017-11-18 MED ORDER — POTASSIUM CHLORIDE 20 MEQ PO PACK
20.0000 meq | PACK | Freq: Two times a day (BID) | ORAL | Status: AC
  Administered 2017-11-18 (×2): 20 meq via ORAL
  Filled 2017-11-18 (×2): qty 1

## 2017-11-18 MED ORDER — HEPARIN SODIUM (PORCINE) 1000 UNIT/ML IJ SOLN
INTRAMUSCULAR | Status: AC
  Filled 2017-11-18: qty 3

## 2017-11-18 NOTE — Evaluation (Signed)
Speech Language Pathology Evaluation Patient Details Name: EVERLEE QUAKENBUSH MRN: [id number] DOB: 1955/07/07 Today's Date: 11/18/2017 Time: 6010-9323 SLP Time Calculation (min) (ACUTE ONLY): 7 min  Problem List:  Patient Active Problem List   Diagnosis Date Noted  . AKI (acute kidney injury) ([place])   . PICC (peripherally inserted central catheter) in place   . Acute pyelonephritis 11/12/2017  . Acute renal failure superimposed on chronic kidney disease ([place]) 11/10/2017  . Uncontrolled type 2 diabetes mellitus with hyperglycemia, with long-term current use of insulin ([place]) 11/10/2017  . Renal failure 11/08/2017  . Seizure disorder ([place]) 11/08/2017  . Orthostatic hypotension 07/25/2014  . Moderate protein malnutrition ([place]) 09/15/2013  . Aspiration pneumonia ([place]) 09/15/2013  . Acute respiratory failure requiring reintubation ([place]) 09/11/2013  . probable Seizures due to metabolic disorder 55/73/2202  . Lactic acidosis 03/19/2013  . Abdominal pain 03/19/2013  . Rotavirus infection 10/29/2012  . Type II or unspecified type diabetes mellitus without mention of complication, uncontrolled 10/29/2012  . Protein-calorie malnutrition, severe ([place]) 10/27/2012  . NSTEMI (non-ST elevated myocardial infarction) ([place]) 10/26/2012  . Fever, unspecified 10/26/2012  . Hypotension 10/25/2012  . Alcohol withdrawal ([place]) 10/25/2012  . Metabolic acidosis 54/27/0623  . Chronic diarrhea 10/25/2012  . Tobacco abuse 10/25/2012  . DKA (diabetic ketoacidoses) ([place]) 09/09/2012  . Dehydration 09/09/2012  . DKA, type 2 ([place]) 05/20/2012  . Abnormal LFTs 05/20/2012  . Heart murmur, systolic 76/28/3151  . Diabetes mellitus ([place]) 07/08/2011  . Hypoglycemia 07/08/2011  . Metabolic encephalopathy 76/16/0737  . Alcohol intoxication ([place]) 07/08/2011  . Alcohol abuse 07/08/2011  . Hypokalemia 07/08/2011  . Nausea & vomiting 07/08/2011  . Abdominal pain 07/08/2011  . H/O chronic pancreatitis 07/08/2011   Past  Medical History:  Past Medical History:  Diagnosis Date  . Alcohol-induced pancreatitis   . Chronic diarrhea   . Depression   . Diabetes mellitus    fasting blood sugar 110-120s  . Diastolic CHF ([place])   . DKA (diabetic ketoacidoses) ([place])   . Gastroparesis   . GERD (gastroesophageal reflux disease)   . Heart murmur   . History of kidney stones   . Hyperlipidemia   . Hypertension   . Hypokalemia   . Muscle spasm   . Neuropathic pain   . Neuropathy    Hx: of  . Pyelonephritis   . Recurrent pancreatitis   . Seizures ([place])   . Vitamin B12 deficiency   . Vitamin D deficiency    Past Surgical History:  Past Surgical History:  Procedure Laterality Date  . CATARACT EXTRACTION W/PHACO Right 03/14/2015   Procedure: CATARACT EXTRACTION PHACO AND INTRAOCULAR LENS PLACEMENT (IOC);  Surgeon: Baruch Goldmann, MD;  Location: [facility];  Service: Ophthalmology;  Laterality: Right;  CDE:11.13  . CATARACT EXTRACTION W/PHACO Left 04/11/2015   Procedure: CATARACT EXTRACTION PHACO AND INTRAOCULAR LENS PLACEMENT LEFT EYE CDE=9.68;  Surgeon: Baruch Goldmann, MD;  Location: [facility];  Service: Ophthalmology;  Laterality: Left;  . COLONOSCOPY  02/24/2010  . cystoscopy with ureteral stent  Bilateral 10/07/2017   At [facility] CV LINE RIGHT  11/17/2017  . IR US GUIDE VASC ACCESS RIGHT  11/17/2017  . MULTIPLE EXTRACTIONS WITH ALVEOLOPLASTY N/A 06/13/2012   Procedure: MULTIPLE EXTRACION WITH ALVEOLOPLASTY EXTRACT: 18, 19, 20, 21, 22, 24, 25, 27, 28, 29, 30, 31;  Surgeon: Gae Bon, DDS;  Location: [facility];  Service: Oral Surgery;  Laterality: N/A;  . TUBAL LIGATION    . URETERAL STENT PLACEMENT  09/2017   HPI:  62 year old female admitted 11/08/17 with weakness. Transferred to [facility] 11/15/27 due to seizure activity. PMH: DM2, HTN, seizure disorder, alcohol induced pancreatitis, GERD, HLD. MRI = Scattered punctate foci of acute infarction, c/w watershed infarction.   Assessment / Plan /  Recommendation Clinical Impression  Pt presents with cognitive impairments however family was not present to comment on baseline status. Documentation in chart from primary physician states son reports his mom " always has some confusion." She was oriented to hospital but not city, followed simple one step commands and generated 7 items in a category (11 is goal). Pt has history of alcoholic pancreatitis and suspect may have chronic cognitive challenges as a result. Pt appears functional in acute setting and additional ST is not recommended however she may benefit from therapy in her home environment.     SLP Assessment  SLP Recommendation/Assessment: All further Speech Lanaguage Pathology  needs can be addressed in the next venue of care SLP Visit Diagnosis: Cognitive communication deficit (R41.841)    Follow Up Recommendations  None    Frequency and Duration           SLP Evaluation Cognition  Overall Cognitive Status: No family/caregiver present to determine baseline cognitive functioning Arousal/Alertness: Awake/alert Orientation Level: Oriented to person;Other (comment)(oriented to building, not city) Attention: Sustained Sustained Attention: Appears intact Memory: (TBA) Awareness: Impaired Awareness Impairment: Intellectual impairment Problem Solving: (TBA) Safety/Judgment: Impaired       Comprehension  Auditory Comprehension Overall Auditory Comprehension: Appears within functional limits for tasks assessed Visual Recognition/Discrimination Discrimination: Not tested Reading Comprehension Reading Status: Not tested    Expression Expression Primary Mode of Expression: Verbal Verbal Expression Overall Verbal Expression: Appears within functional limits for tasks assessed Initiation: No impairment Level of Generative/Spontaneous Verbalization: Sentence Naming: Not tested Pragmatics: No impairment Written Expression Dominant Hand: Right Written Expression: Not tested    Oral / Motor  Oral Motor/Sensory Function Overall Oral Motor/Sensory Function: Generalized oral weakness Motor Speech Overall Motor Speech: Appears within functional limits for tasks assessed Respiration: Within functional limits Phonation: Normal Resonance: Within functional limits Articulation: Within functional limitis Intelligibility: Intelligible Motor Planning: Witnin functional limits   GO                    Houston Siren 11/18/2017, 4:26 PM  Orbie Pyo Rashid Whitenight M.Ed Risk analyst [phone] Office [phone]

## 2017-11-18 NOTE — Progress Notes (Signed)
  Speech Language Pathology Treatment: Dysphagia  Patient Details Name: Erica [name] MRN: [id number] DOB: 1955-09-23 Today's Date: 11/18/2017 Time: 9242-6834 SLP Time Calculation (min) (ACUTE ONLY): 7 min  Assessment / Plan / Recommendation Clinical Impression  Pt self feeding with large bite sizes and was minimally stimulable with verbal cueing; SLP provided tactile cues to decrease volume on spoon. Trials thin given initially with immediate cough as airway intrusion is suspected. Pt continued to cough during session after Dys 2 consumption but may be effects from thin trials. Mastication with Dys 2 adequate- recommend checking oral cavity for pocketed food. Recommend MBS tomorrow, continue Dys 2, nectar until then.   HPI HPI: 62 year old female admitted 11/08/17 with weakness. Transferred to [facility] 11/15/27 due to seizure activity. PMH: DM2, HTN, seizure disorder, alcohol induced pancreatitis, GERD, HLD. MRI = Scattered punctate foci of acute infarction, c/w watershed infarction.      SLP Plan  New goals to be determined pending instrumental study       Recommendations  Diet recommendations: Nectar-thick liquid;Dysphagia 2 (fine chop) Liquids provided via: Cup Medication Administration: Whole meds with puree Supervision: Patient able to self feed Compensations: Minimize environmental distractions;Small sips/bites;Slow rate;Follow solids with liquid Postural Changes and/or Swallow Maneuvers: Seated upright 90 degrees                Oral Care Recommendations: Oral care BID Follow up Recommendations: Other (comment)(TBD) SLP Visit Diagnosis: Dysphagia, pharyngeal phase (R13.13) Plan: New goals to be determined pending instrumental study       Erica [name] 11/18/2017, 3:46 PM  Orbie Pyo Colvin Caroli.Ed Risk analyst [phone] Office [phone]

## 2017-11-18 NOTE — Progress Notes (Signed)
[facility] Progress Note   Subjective:    HD today She has no complaints but not fully aware of current issues  Objective Vitals:   11/17/17 1656 11/17/17 1945 11/17/17 2342 11/18/17 0351  BP: 137/80 137/73 133/71 (!) 149/85  Pulse: (!) 101 (!) 101 (!) 106 (!) 101  Resp: _0 Temp: 98.4 F (36.9 C) 98.1 F (36.7 C) 98 F (36.7 C) 98.5 F (36.9 C)  TempSrc: Axillary Axillary Oral Oral  SpO2: 98% 100% 100% 100%  Weight:      Height:       Physical Exam General: sitting up eating breakfast Heart: RRR, no rub Lungs: normal WOB, clear anteriorly Abdomen: soft, nontender Extremities: trace to 1+ LE edema GU: no foley Dialysis Access:  RIJ temp cath c/d/i  Additional Objective Labs: Basic Metabolic Panel: Recent Labs  Lab 11/14/17 1615 11/15/17 0807  11/16/17 0514 11/17/17 2245 11/18/17 0434  NA 135 139   < > 138 140 141  K 3.6 3.7   < > 3.6 3.4* 3.2*  CL 99 103   < > 105 107 109  CO2 23 24   < > 19* 23 26  GLUCOSE 125* 93   < > 202* 107* 74  BUN 22 23   < > 24* 14 14  CREATININE 4.81* 5.08*   < > 5.48* 3.71* 3.93*  CALCIUM 7.0* 7.6*   < > 7.2* 7.6* 7.5*  PHOS 4.4 4.4  --  4.2  --   --    < > = values in this interval not displayed.   Liver Function Tests: Recent Labs  Lab 11/15/17 0807 11/15/17 0924 11/16/17 0514  AST  --  24  --   ALT  --  12  --   ALKPHOS  --  69  --   BILITOT  --  0.9  --   PROT  --  5.8*  --   ALBUMIN 1.9* 1.9* 1.8*   No results for input(s): LIPASE, AMYLASE in the last 168 hours. CBC: Recent Labs  Lab 11/14/17 1045 11/14/17 1615 11/15/17 0807 11/16/17 0514 11/18/17 0434  WBC 12.5* 10.7* 10.3 14.9* 13.5*  HGB 7.8* 7.0* 7.7* 7.2* 8.6*  HCT 25.2* 22.9* 25.9* 23.7* 28.7*  MCV 88.7 89.8 91.8 92.2 89.1  PLT 282 258 242 259 188   Blood Culture    Component Value Date/Time   SDES BLOOD CENTRAL LINE 11/16/2017 1710   SPECREQUEST  11/16/2017 1710    BOTTLES DRAWN AEROBIC AND ANAEROBIC Blood Culture  results may not be optimal due to an excessive volume of blood received in culture bottles   CULT  11/16/2017 1710    NO GROWTH 2 DAYS Performed at [facility], [place] [address] [place], [place] [address]    REPTSTATUS PENDING 11/16/2017 1710    Cardiac Enzymes: Recent Labs  Lab 11/14/17 1045  TROPONINI <0.03   CBG: Recent Labs  Lab 11/17/17 0637 11/17/17 1655 11/17/17 1722 11/17/17 2107 11/18/17 0606  GLUCAP 81 91 80 114* 70   Iron Studies:  No results for input(s): IRON, TIBC, TRANSFERRIN, FERRITIN in the last 72 hours. _1 @ Studies/Results: Mr Virgel Paling CS Contrast  Result Date: 11/17/2017 CLINICAL DATA:  Confusion for 2-3 days. History of hypertension, diabetes and seizures. EXAM: MRA HEAD WITHOUT CONTRAST TECHNIQUE: Angiographic images of the Circle of Willis were obtained using MRA technique without intravenous contrast. COMPARISON:  MRI head November 15, 2017 FINDINGS: Mild motion degraded examination. ANTERIOR  CIRCULATION: Normal flow related enhancement of the included cervical, petrous, cavernous and supraclinoid internal carotid arteries. Patent anterior communicating artery. Patent anterior and middle cerebral arteries, mild luminal irregularity. No large vessel occlusion, flow limiting stenosis, aneurysm. POSTERIOR CIRCULATION: RIGHT vertebral artery is dominant. Vertebrobasilar arteries are patent, with normal flow related enhancement of the main branch vessels. Stenotic RIGHT P1 segment with predominant RIGHT vascular supply provided by posterior communicating artery. Small LEFT posterior communicating artery present. Patent posterior cerebral arteries, mild luminal irregularity. No large vessel occlusion, flow limiting stenosis,  aneurysm. ANATOMIC VARIANTS: None. Source images and MIP images were reviewed. IMPRESSION: 1. Motion degraded examination. No emergent large vessel occlusion or flow-limiting stenosis. 2. Mild intracranial atherosclerosis versus  motion artifact. Electronically Signed   By: Elon Alas M.D.   On: 11/17/2017 22:24   Ir Fluoro Guide Cv Line Right  Result Date: 11/17/2017 INDICATION: End-stage renal disease. Please perform image guided placement of a temporary dialysis catheter for the initiation dialysis. EXAM: NON-TUNNELED CENTRAL VENOUS HEMODIALYSIS CATHETER PLACEMENT WITH ULTRASOUND AND FLUOROSCOPIC GUIDANCE COMPARISON:  None. MEDICATIONS: None FLUOROSCOPY TIME:  18 seconds (2 mGy) COMPLICATIONS: None immediate. PROCEDURE: Informed written consent was obtained from the patient after a discussion of the risks, benefits, and alternatives to treatment. Questions regarding the procedure were encouraged and answered. The right neck and chest were prepped with chlorhexidine in a sterile fashion, and a sterile drape was applied covering the operative field. Maximum barrier sterile technique with sterile gowns and gloves were used for the procedure. A timeout was performed prior to the initiation of the procedure. After the overlying soft tissues were anesthetized, a small venotomy incision was created and a micropuncture kit was utilized to access the internal jugular vein. Real-time ultrasound guidance was utilized for vascular access including the acquisition of a permanent ultrasound image documenting patency of the accessed vessel. The microwire was utilized to measure appropriate catheter length. A stiff glidewire was advanced to the level of the IVC. Under fluoroscopic guidance, the venotomy was serially dilated, ultimately allowing placement of a 20 cm temporary Trialysis catheter with tip ultimately terminating within the superior aspect of the right atrium. Final catheter positioning was confirmed and documented with a spot radiographic image. The catheter aspirates and flushes normally. The catheter was flushed with appropriate volume heparin dwells. The catheter exit site was secured with a 0-Prolene retention suture. A  dressing was placed. The patient tolerated the procedure well without immediate post procedural complication. IMPRESSION: Successful placement of a right internal jugular approach 20 cm temporary dialysis catheter with tip terminating with in the superior aspect of the right atrium. The catheter is ready for immediate use. PLAN: This catheter may be converted to a tunneled dialysis catheter at a later date as indicated. Electronically Signed   By: Sandi Mariscal M.D.   On: 11/17/2017 12:06   Ir US Guide Vasc Access Right  Result Date: 11/17/2017 INDICATION: End-stage renal disease. Please perform image guided placement of a temporary dialysis catheter for the initiation dialysis. EXAM: NON-TUNNELED CENTRAL VENOUS HEMODIALYSIS CATHETER PLACEMENT WITH ULTRASOUND AND FLUOROSCOPIC GUIDANCE COMPARISON:  None. MEDICATIONS: None FLUOROSCOPY TIME:  18 seconds (2 mGy) COMPLICATIONS: None immediate. PROCEDURE: Informed written consent was obtained from the patient after a discussion of the risks, benefits, and alternatives to treatment. Questions regarding the procedure were encouraged and answered. The right neck and chest were prepped with chlorhexidine in a sterile fashion, and a sterile drape was applied covering the operative field. Maximum barrier sterile technique with  sterile gowns and gloves were used for the procedure. A timeout was performed prior to the initiation of the procedure. After the overlying soft tissues were anesthetized, a small venotomy incision was created and a micropuncture kit was utilized to access the internal jugular vein. Real-time ultrasound guidance was utilized for vascular access including the acquisition of a permanent ultrasound image documenting patency of the accessed vessel. The microwire was utilized to measure appropriate catheter length. A stiff glidewire was advanced to the level of the IVC. Under fluoroscopic guidance, the venotomy was serially dilated, ultimately allowing  placement of a 20 cm temporary Trialysis catheter with tip ultimately terminating within the superior aspect of the right atrium. Final catheter positioning was confirmed and documented with a spot radiographic image. The catheter aspirates and flushes normally. The catheter was flushed with appropriate volume heparin dwells. The catheter exit site was secured with a 0-Prolene retention suture. A dressing was placed. The patient tolerated the procedure well without immediate post procedural complication. IMPRESSION: Successful placement of a right internal jugular approach 20 cm temporary dialysis catheter with tip terminating with in the superior aspect of the right atrium. The catheter is ready for immediate use. PLAN: This catheter may be converted to a tunneled dialysis catheter at a later date as indicated. Electronically Signed   By: Sandi Mariscal M.D.   On: 11/17/2017 12:06   Medications: . sodium chloride Stopped (11/11/17 1911)  . sodium chloride 100 mL/hr at 11/18/17 0031  . cefTRIAXone (ROCEPHIN)  IV 2 g (11/18/17 0033)  . levETIRAcetam 1,000 mg (11/18/17 9381)  . levETIRAcetam     .  stroke: mapping our early stages of recovery book   Does not apply Once  . sodium chloride   Intravenous Once  . Chlorhexidine Gluconate Cloth  6 each Topical Q0600  . darbepoetin (ARANESP) injection - DIALYSIS  60 mcg Intravenous Q Tue-HD  . feeding supplement (NEPRO CARB STEADY)  237 mL Oral BID BM  . heparin injection (subcutaneous)  5,000 Units Subcutaneous Q8H  . insulin aspart  0-9 Units Subcutaneous TID WC  . insulin glargine  4 Units Subcutaneous Daily  . phenytoin  125 mg Oral BID  . sodium chloride flush  10-40 mL Intracatheter Q12H  . topiramate  25 mg Oral BID     Assessment/Plan:  1.  AKI on CKD:  Recent course with emphysematous pyelonephritis treated at [facility], urologic intervention double J stents placed.  During that admission Cr ~ 8 on presentation, down to 4 on discharge.   Represented 2 weeks later with Cr up to 11.  She was initially hydrated but due lack of improved had HD x 2 at [facility] prior to transfer here on 9/29. She has required ongoing HD here, another tx today.  Continue to monitor for recovery -- I think it might be helpful to insert a foley catheter and do a 24 hour urine collection to check CrCl; she has little muscle mass and even with a Cr 4 I think it's likely her clearance is poor.  Will consider doing that tomorrow AM.  She is on treatment for pyelonephritis and has ureteral stents in place so I've been reluctant to place foley.  Has been unable to have purewick due to suction canister triage in room, but that also may be a possiblity.    Given fever 10/1we removed her femoral HD catheter following HD.  Blood culture is negative to date.   2.   HTN:   Reasonable control  for now.  BP has ranged 130-140 in past 24h. She is on no antiHTN here.   3.  Anemia:  Hb has trended down significantly from 10.1 on 9/23 to 7.2 and she rec'd transfusion yesterday. Iron indices 9/30 show iron replete.  Hemoccult + and started on PPI but no signs of gross bleeding.  Given aranesp 55 with HD 10/1.  Hb was in 9-10 at [facility] earlier this month.   4.  BMM: phos 4.2, corr ca normal (albumin 1.8).   5. Seizures:  H/o hypoglycemia induced seizures; neurology following here and dosing meds for dialysis at this time.  She appears to have improved.    6.  E coli UTI: Rocephin 2g daily since 9/26.  Has ureteral stents in place from prior hospitalization.  Urology supposed to remove while she is here.    7.  DM: per primary.   8.  Hypokalemia: K 3.2. Use 3K bath and give KCl 20 BID today.  Jannifer Hick MD 11/18/2017, 10:22 AM  [facility]

## 2017-11-18 NOTE — Progress Notes (Addendum)
[facility] PROGRESS NOTE  Erica [name] OIB:[id number] DOB: February 24, 1955 DOA: 11/08/2017  PCP: Octavio Graves, DO  Brief History/Interval Summary: 62 year old African-American female with past medical history of diabetes mellitus type 2, essential hypertension, history of seizure disorder presented with 2 to 3 days of confusion. The patient was also complaining of lower abd pain with dysuria.The patient was recently hospitalized at [facility] from 10/07/2017 through 10/21/2017 during which she was treated for emphysematous pyelonephritis and acute on chronic renal failure. She also was treated for DKA and fluid overload.She was noted to have hemoglobin A1c of 12.1. She required transfusion 2 u PRBC without evidence of active bleeding.She underwent bilateral ureteral stenting on 10/07/2017. She was treated appropriately with antibiotics. She was discharged with instructions for outpatient cystoscopy and removal of stents in 2 to 3 weeks. Upon thisadmission, the patient has serum creatinine of 8.1. At the time of discharge, her serum creatinine has stabilized between 3.7-3.9.Nephrology was consulted to assist with management. Urology was also consulted due to retained stents--they are currently planning to remove stents. Patient had seizures 11/14/2017, she was transferred to [facility] for further work-up , her MRI brain significant for scattered infarcts, as well her seizures has been managed by neurology, hemodialysis has been continued during this hospital stay.   Consultants: Nephrology.  Urology.  Neurology.  Procedures:   Transthoracic echocardiogram Study Conclusions  - Left ventricle: The cavity size was normal. Moderate focal basal   septal hypertrophy. Mild hypertrophy of the rest of the   ventricle. No LV outflow tract gradient noted. Systolic function   was normal. The estimated ejection fraction was in the range of   60% to 65%. Wall motion was  normal; there were no regional wall   motion abnormalities. Doppler parameters are consistent with   abnormal left ventricular relaxation (grade 1 diastolic   dysfunction). - Aortic valve: There was no stenosis. There was trivial   regurgitation. - Mitral valve: There was chordal systolic anterior motion but not   valvular SAM. Moderately calcified annulus. There was trivial   regurgitation. - Right ventricle: The cavity size was normal. Systolic function   was normal. - Right atrium: Catheter noted in RA. - Tricuspid valve: Peak RV-RA gradient (S): 47 mm Hg. - Pulmonary arteries: PA peak pressure: 50 mm Hg (S). - Inferior vena cava: The vessel was normal in size. The   respirophasic diameter changes were in the normal range (>= 50%),   consistent with normal central venous pressure.  Impressions:  - Normal LV size with moderate asymmetric basal septal hypertrophy,   mild hypertrophy of the rest of the ventricle. No LVOT gradient.   There was chordal but not mitral valve SAM. Normal RV size and   systolic function. No significant valvular abnormalities.   Differential is LVH due to HTN versus a variant of hypertrophic   cardiomyopathy.  Antibiotics: Ceftriaxone 9/23  Subjective/Interval History: Patient remains pleasantly confused.  Denies any pain.  No shortness of breath.    ROS: No nausea vomiting or abdominal pain.  Objective:  Vital Signs  Vitals:   11/18/17 0930 11/18/17 1000 11/18/17 1030 11/18/17 1100  BP: (!) 142/83 139/74 (!) 141/85 (!) 148/82  Pulse: (!) 104 97 (!) 109 (!) 104  Resp: _0 Temp:      TempSrc:      SpO2:      Weight:      Height:        Intake/Output Summary (  Last 24 hours) at 11/18/2017 1122 Last data filed at 11/18/2017 0437 Gross per 24 hour  Intake 10 ml  Output -  Net 10 ml   Filed Weights   11/13/17 2230 11/16/17 1505 11/16/17 1805  Weight: 50 kg 50.4 kg 50 kg    General appearance: Awake alert.  Distracted.  In no  distress. Resp: Clear to auscultation bilaterally.  No wheezing rales or rhonchi Cardio: S1-S2 is normal regular.  No S3-S4.  No rubs murmurs or bruit GI: Abdomen soft.  Nontender nondistended Extremities: No pedal edema Neurologic: Noted to be able to move her left side better today than yesterday.  Remains disoriented.  Alert.  No other focal neurological deficits noted.  Lab Results:  Data Reviewed: I have personally reviewed following labs and imaging studies  CBC: Recent Labs  Lab 11/14/17 1045 11/14/17 1615 11/15/17 0807 11/16/17 0514 11/18/17 0434  WBC 12.5* 10.7* 10.3 14.9* 13.5*  HGB 7.8* 7.0* 7.7* 7.2* 8.6*  HCT 25.2* 22.9* 25.9* 23.7* 28.7*  MCV 88.7 89.8 91.8 92.2 89.1  PLT 282 258 242 259 412    Basic Metabolic Panel: Recent Labs  Lab 11/13/17 0824  11/14/17 0656  11/14/17 1615 11/15/17 0807 11/15/17 0924 11/16/17 0514 11/17/17 2245 11/18/17 0434  NA 135   < > 138   < > 135 139 137 138 140 141  K 3.0*   < > 3.6   < > 3.6 3.7 3.6 3.6 3.4* 3.2*  CL 93*   < > 97*   < > 99 103 101 105 107 109  CO2 30   < > 27   < > _0 19* 23 26  GLUCOSE 130*   < > 80   < > 125* 93 88 202* 107* 74  BUN 39*   < > 23   < > _1 24* 14 14  CREATININE 6.55*   < > 4.57*   < > 4.81* 5.08* 5.06* 5.48* 3.71* 3.93*  CALCIUM 5.9*   < > 7.1*   < > 7.0* 7.6* 7.4* 7.2* 7.6* 7.5*  PHOS 3.5  --  3.6  --  4.4 4.4  --  4.2  --   --    < > = values in this interval not displayed.    GFR: Estimated Creatinine Clearance: 10.5 mL/min (A) (by C-G formula based on SCr of 3.93 mg/dL (H)).  Liver Function Tests: Recent Labs  Lab 11/14/17 0656 11/14/17 1615 11/15/17 0807 11/15/17 0924 11/16/17 0514  AST  --   --   --  24  --   ALT  --   --   --  12  --   ALKPHOS  --   --   --  69  --   BILITOT  --   --   --  0.9  --   PROT  --   --   --  5.8*  --   ALBUMIN 2.0* 1.8* 1.9* 1.9* 1.8*    Coagulation Profile: Recent Labs  Lab 11/14/17 1045  INR 1.47    Cardiac  Enzymes: Recent Labs  Lab 11/14/17 1045  TROPONINI <0.03    CBG: Recent Labs  Lab 11/17/17 0637 11/17/17 1655 11/17/17 1722 11/17/17 2107 11/18/17 0606  GLUCAP 81 91 80 114* 70    Anemia Panel: No results for input(s): VITAMINB12, FOLATE, FERRITIN, TIBC, IRON, RETICCTPCT in the last 72 hours.  Recent Results (from the past 240 hour(s))  Urine culture  Status: Abnormal   Collection Time: 11/08/17  8:07 PM  Result Value Ref Range Status   Specimen Description   Final    URINE, RANDOM Performed at [facility], [address] [place], [place] [address]    Special Requests   Final    NONE Performed at [facility], [address] [place], [place] [address]    Culture >=100,000 COLONIES/mL ESCHERICHIA COLI (A)  Final   Report Status 11/11/2017 FINAL  Final   Organism ID, Bacteria ESCHERICHIA COLI (A)  Final      Susceptibility   Escherichia coli - MIC*    AMPICILLIN >=32 RESISTANT Resistant     CEFAZOLIN <=4 SENSITIVE Sensitive     CEFTRIAXONE <=1 SENSITIVE Sensitive     CIPROFLOXACIN <=0.25 SENSITIVE Sensitive     GENTAMICIN <=1 SENSITIVE Sensitive     IMIPENEM <=0.25 SENSITIVE Sensitive     NITROFURANTOIN <=16 SENSITIVE Sensitive     TRIMETH/SULFA <=20 SENSITIVE Sensitive     AMPICILLIN/SULBACTAM 16 INTERMEDIATE Intermediate     PIP/TAZO <=4 SENSITIVE Sensitive     Extended ESBL NEGATIVE Sensitive     * >=100,000 COLONIES/mL ESCHERICHIA COLI  C difficile quick scan w PCR reflex     Status: None   Collection Time: 11/13/17 11:17 PM  Result Value Ref Range Status   C Diff antigen NEGATIVE NEGATIVE Final   C Diff toxin NEGATIVE NEGATIVE Final   C Diff interpretation No C. difficile detected.  Final    Comment: VALID Performed at [facility], [address] [place], [place] [address]   MRSA PCR Screening     Status: None   Collection Time: 11/14/17  5:26 AM  Result Value Ref Range Status   MRSA by PCR NEGATIVE NEGATIVE Final    Comment:        The  GeneXpert MRSA Assay (FDA approved for NASAL specimens only), is one component of a comprehensive MRSA colonization surveillance program. It is not intended to diagnose MRSA infection nor to guide or monitor treatment for MRSA infections. Performed at [facility], [place] [address] [place], [place] [address]   Culture, blood (single)     Status: None (Preliminary result)   Collection Time: 11/16/17  5:10 PM  Result Value Ref Range Status   Specimen Description BLOOD CENTRAL LINE  Final   Special Requests   Final    BOTTLES DRAWN AEROBIC AND ANAEROBIC Blood Culture results may not be optimal due to an excessive volume of blood received in culture bottles   Culture   Final    NO GROWTH 2 DAYS Performed at [facility], [place] [address], [place], [place] [address]    Report Status PENDING  Incomplete      Radiology Studies: Mr Virgel Paling IE Contrast  Result Date: 11/17/2017 CLINICAL DATA:  Confusion for 2-3 days. History of hypertension, diabetes and seizures. EXAM: MRA HEAD WITHOUT CONTRAST TECHNIQUE: Angiographic images of the Circle of Willis were obtained using MRA technique without intravenous contrast. COMPARISON:  MRI head November 15, 2017 FINDINGS: Mild motion degraded examination. ANTERIOR CIRCULATION: Normal flow related enhancement of the included cervical, petrous, cavernous and supraclinoid internal carotid arteries. Patent anterior communicating artery. Patent anterior and middle cerebral arteries, mild luminal irregularity. No large vessel occlusion, flow limiting stenosis, aneurysm. POSTERIOR CIRCULATION: RIGHT vertebral artery is dominant. Vertebrobasilar arteries are patent, with normal flow related enhancement of the main branch vessels. Stenotic RIGHT P1 segment with predominant RIGHT vascular supply provided by posterior  communicating artery. Small LEFT posterior communicating artery present. Patent posterior cerebral arteries, mild luminal irregularity. No  large vessel occlusion, flow limiting stenosis,  aneurysm. ANATOMIC VARIANTS: None. Source images and MIP images were reviewed. IMPRESSION: 1. Motion degraded examination. No emergent large vessel occlusion or flow-limiting stenosis. 2. Mild intracranial atherosclerosis versus motion artifact. Electronically Signed   By: Elon Alas M.D.   On: 11/17/2017 22:24   Ir Fluoro Guide Cv Line Right  Result Date: 11/17/2017 INDICATION: End-stage renal disease. Please perform image guided placement of a temporary dialysis catheter for the initiation dialysis. EXAM: NON-TUNNELED CENTRAL VENOUS HEMODIALYSIS CATHETER PLACEMENT WITH ULTRASOUND AND FLUOROSCOPIC GUIDANCE COMPARISON:  None. MEDICATIONS: None FLUOROSCOPY TIME:  18 seconds (2 mGy) COMPLICATIONS: None immediate. PROCEDURE: Informed written consent was obtained from the patient after a discussion of the risks, benefits, and alternatives to treatment. Questions regarding the procedure were encouraged and answered. The right neck and chest were prepped with chlorhexidine in a sterile fashion, and a sterile drape was applied covering the operative field. Maximum barrier sterile technique with sterile gowns and gloves were used for the procedure. A timeout was performed prior to the initiation of the procedure. After the overlying soft tissues were anesthetized, a small venotomy incision was created and a micropuncture kit was utilized to access the internal jugular vein. Real-time ultrasound guidance was utilized for vascular access including the acquisition of a permanent ultrasound image documenting patency of the accessed vessel. The microwire was utilized to measure appropriate catheter length. A stiff glidewire was advanced to the level of the IVC. Under fluoroscopic guidance, the venotomy was serially dilated, ultimately allowing placement of a 20 cm temporary Trialysis catheter with tip ultimately terminating within the superior aspect of the right  atrium. Final catheter positioning was confirmed and documented with a spot radiographic image. The catheter aspirates and flushes normally. The catheter was flushed with appropriate volume heparin dwells. The catheter exit site was secured with a 0-Prolene retention suture. A dressing was placed. The patient tolerated the procedure well without immediate post procedural complication. IMPRESSION: Successful placement of a right internal jugular approach 20 cm temporary dialysis catheter with tip terminating with in the superior aspect of the right atrium. The catheter is ready for immediate use. PLAN: This catheter may be converted to a tunneled dialysis catheter at a later date as indicated. Electronically Signed   By: Sandi Mariscal M.D.   On: 11/17/2017 12:06   Ir US Guide Vasc Access Right  Result Date: 11/17/2017 INDICATION: End-stage renal disease. Please perform image guided placement of a temporary dialysis catheter for the initiation dialysis. EXAM: NON-TUNNELED CENTRAL VENOUS HEMODIALYSIS CATHETER PLACEMENT WITH ULTRASOUND AND FLUOROSCOPIC GUIDANCE COMPARISON:  None. MEDICATIONS: None FLUOROSCOPY TIME:  18 seconds (2 mGy) COMPLICATIONS: None immediate. PROCEDURE: Informed written consent was obtained from the patient after a discussion of the risks, benefits, and alternatives to treatment. Questions regarding the procedure were encouraged and answered. The right neck and chest were prepped with chlorhexidine in a sterile fashion, and a sterile drape was applied covering the operative field. Maximum barrier sterile technique with sterile gowns and gloves were used for the procedure. A timeout was performed prior to the initiation of the procedure. After the overlying soft tissues were anesthetized, a small venotomy incision was created and a micropuncture kit was utilized to access the internal jugular vein. Real-time ultrasound guidance was utilized for vascular access including the acquisition of a  permanent ultrasound image documenting patency of the accessed vessel. The microwire  was utilized to measure appropriate catheter length. A stiff glidewire was advanced to the level of the IVC. Under fluoroscopic guidance, the venotomy was serially dilated, ultimately allowing placement of a 20 cm temporary Trialysis catheter with tip ultimately terminating within the superior aspect of the right atrium. Final catheter positioning was confirmed and documented with a spot radiographic image. The catheter aspirates and flushes normally. The catheter was flushed with appropriate volume heparin dwells. The catheter exit site was secured with a 0-Prolene retention suture. A dressing was placed. The patient tolerated the procedure well without immediate post procedural complication. IMPRESSION: Successful placement of a right internal jugular approach 20 cm temporary dialysis catheter with tip terminating with in the superior aspect of the right atrium. The catheter is ready for immediate use. PLAN: This catheter may be converted to a tunneled dialysis catheter at a later date as indicated. Electronically Signed   By: Sandi Mariscal M.D.   On: 11/17/2017 12:06     Medications:  Scheduled: .  stroke: mapping our early stages of recovery book   Does not apply Once  . sodium chloride   Intravenous Once  . Chlorhexidine Gluconate Cloth  6 each Topical Q0600  . darbepoetin (ARANESP) injection - DIALYSIS  60 mcg Intravenous Q Tue-HD  . feeding supplement (NEPRO CARB STEADY)  237 mL Oral BID BM  . heparin injection (subcutaneous)  5,000 Units Subcutaneous Q8H  . insulin aspart  0-9 Units Subcutaneous TID WC  . insulin glargine  4 Units Subcutaneous Daily  . phenytoin  125 mg Oral BID  . potassium chloride  20 mEq Oral BID  . sodium chloride flush  10-40 mL Intracatheter Q12H  . topiramate  25 mg Oral BID   Continuous: . sodium chloride Stopped (11/11/17 1911)  . sodium chloride 100 mL/hr at 11/18/17 0031  .  cefTRIAXone (ROCEPHIN)  IV 2 g (11/18/17 0033)  . levETIRAcetam 1,000 mg (11/18/17 2706)  . levETIRAcetam     CBJ:SEGBTD chloride, acetaminophen **OR** acetaminophen, levETIRAcetam, ondansetron **OR** ondansetron (ZOFRAN) IV, RESOURCE THICKENUP CLEAR, senna-docusate, sodium chloride flush  Assessment/Plan:    Acute on chronic renal failure--stage unspecified presently -etiology likely infection and hemodynamic changes/ATN -Nephrology is following.  Patient is being dialyzed.   -Long-term plan is not entirely clear yet. -Non-tunneled HD catheter placed by interventional radiology.  Defer management to nephrology.  Defer electrolyte replacement to nephrology.  Pyelonephritis -11/08/17 urine culture = Ecoli -Patient was started on ceftriaxone on 9/23. -11/08/2017 CT renal protocol--bilateral ureteral stents without hydronephrosis. Small bilateral renal calculi. Small amount of ascites with anasarca. Thickened stomach may be related to ascites. -ureteral stents initially placed 10/07/17 -Discussed with Dr. Alyson Ingles with urology today.  No need for removal of stents during this hospital stay.  This can be pursued in the office.  Seizure disorder/Left Hemiparesis -Seizures appear to be new onset after dialysis.  Patient was started on antiepileptics.  Noted to be on Dilantin and Keppra and Topamax.  No further twitching episodes noted.  Neurology has been following.  MRA head did not show any significant stenosis.  Acute CVA  -MRI significant for watershed infarction at both frontal and parietal varices.  Patient was noted to have left-sided weakness yesterday but appears to have good strength today.  PT and OT evaluation.  Continue to monitor.   Diabetes mellitus type 2, uncontrolled with hyperglycemia -Monitor CBGs.  SSI. -10/07/17 A1C = 12.1 LDL 40.  Essential hypertension Patient was taken off of amlodipine and metoprolol due to  soft blood pressures initially.  Pressures are  reasonably controlled at this time.  Would not be too aggressive with treatment as patient is getting dialysis.  Echocardiogram showed normal systolic function.  Asymmetric septal hypertrophy was noted.  Will need outpatient follow-up with cardiology eventually.  Metabolic acidosis -Secondary to acute on chronic renal failure Patient was on bicarbonate infusion.  Not anymore.  Bicarbonate levels have improved.  Diarrhea Negative C. difficile.  Imodium as needed.  Severe protein calorie malnutrition -continue supplements  Normocytic Anemia -Hemoglobin  was around 9 during recent hospital stay in [place], and was around 9 on admission, slowly drifting down.  Patient was transfused 1 unit of PRBC on 10/1.  Hemoglobin is 8.6 today.  No evidence for acute blood loss. -Anemia of chronic blood loss, and chronic illness and kidney disease, receiving Procrit with dialysis, at this point no IV iron in the setting of her severe infection from pyelonephritis. -Hemoccult positive, this is most likely in the setting diarrhea, as she has Flexi-Seal with green-colored stool in the bag, no significant evidence of GI bleed, continue Protonix 40 twice daily.  No GI bleed noted.  Outpatient work-up.  DVT Prophylaxis: Subcutaneous heparin    Code Status: Full code Family Communication: No family at bedside Disposition Plan: Management as outlined above.  Wait for acute medical issues to improve.  PT is recommending skilled nursing facility for rehab.    LOS: 10 days   [facility] Pager [phone] 11/18/2017, 11:22 AM  If 7PM-7AM, please contact night-coverage at [email or web address], password [facility]

## 2017-11-18 NOTE — Progress Notes (Addendum)
Subjective: Patient currently in hemodialysis having no discomfort and no complaints  Exam: Vitals:   11/18/17 1553 11/18/17 1554  BP: 137/76 137/76  Pulse: (!) 104 (!) 104  Resp:  15  Temp: 98.8 F (37.1 C) 98.8 F (37.1 C)  SpO2: 96% 96%    Physical Exam   HEENT-  Normocephalic, no lesions, without obvious abnormality.  Normal external eye and conjunctiva.   Extremities- Warm, dry and intact Musculoskeletal-no joint tenderness, deformity or swelling Skin-warm and dry, no hyperpigmentation, vitiligo, or suspicious lesions    Neuro:  Mental Status: Alert, oriented to hospital, [place], month, but not the year.Marland Kitchen  Speech fluent without evidence of aphasia.  Able to follow 3 step commands without difficulty. Cranial Nerves: II:  Visual fields grossly normal,  III,IV, VI: ptosis not present, extra-ocular motions intact bilaterally pupils equal, round, reactive to light and accommodation V,VII: smile symmetric with small right facial droop, facial light touch sensation normal bilaterally VIII: hearing normal bilaterally  Motor: Patient showing bilateral upper extremity of 4/5 however difficult to test the right upper extremity secondary to hemodialysis.  Bilateral legs were stronger today and able to lift off the bed with a 4-/5. Sensory: Pinprick and light touch intact throughout, bilaterally Deep Tendon Reflexes: 2+ and symmetric throughout Plantars: Right: downgoing   Left: downgoing     Medications:  Scheduled: .  stroke: mapping our early stages of recovery book   Does not apply Once  . sodium chloride   Intravenous Once  . Chlorhexidine Gluconate Cloth  6 each Topical Q0600  . darbepoetin (ARANESP) injection - DIALYSIS  60 mcg Intravenous Q Tue-HD  . feeding supplement (NEPRO CARB STEADY)  237 mL Oral BID BM  . heparin      . heparin injection (subcutaneous)  5,000 Units Subcutaneous Q8H  . insulin aspart  0-9 Units Subcutaneous TID WC  . insulin glargine  4  Units Subcutaneous Daily  . phenytoin  125 mg Oral BID  . potassium chloride  20 mEq Oral BID  . saccharomyces boulardii  250 mg Oral BID  . sodium chloride flush  10-40 mL Intracatheter Q12H  . topiramate  25 mg Oral BID    Pertinent Labs/Diagnostics:   Mr Virgel Paling YI Contrast  Result Date: 11/17/2017 CLINICAL DATA:  Confusion for 2-3 days. History of hypertension, diabetes and seizures. EXAM: MRA HEAD WITHOUT CONTRAST TECHNIQUE: Angiographic images of the Circle of Willis were obtained using MRA technique without intravenous contrast. COMPARISON:  MRI head November 15, 2017 FINDINGS: Mild motion degraded examination. ANTERIOR CIRCULATION: Normal flow related enhancement of the included cervical, petrous, cavernous and supraclinoid internal carotid arteries. Patent anterior communicating artery. Patent anterior and middle cerebral arteries, mild luminal irregularity. No large vessel occlusion, flow limiting stenosis, aneurysm. POSTERIOR CIRCULATION: RIGHT vertebral artery is dominant. Vertebrobasilar arteries are patent, with normal flow related enhancement of the main branch vessels. Stenotic RIGHT P1 segment with predominant RIGHT vascular supply provided by posterior communicating artery. Small LEFT posterior communicating artery present. Patent posterior cerebral arteries, mild luminal irregularity. No large vessel occlusion, flow limiting stenosis,  aneurysm. ANATOMIC VARIANTS: None. Source images and MIP images were reviewed. IMPRESSION: 1. Motion degraded examination. No emergent large vessel occlusion or flow-limiting stenosis. 2. Mild intracranial atherosclerosis versus motion artifact. Electronically Signed   By: Elon Alas M.D.   On: 11/17/2017 22:24   Ir Fluoro Guide Cv Line Right  Result Date: 11/17/2017 INDICATION: End-stage renal disease. Please perform image guided placement of a temporary dialysis  catheter for the initiation dialysis. EXAM: NON-TUNNELED CENTRAL VENOUS  HEMODIALYSIS CATHETER PLACEMENT WITH ULTRASOUND AND FLUOROSCOPIC GUIDANCE COMPARISON:  None. MEDICATIONS: None FLUOROSCOPY TIME:  18 seconds (2 mGy) COMPLICATIONS: None immediate. PROCEDURE: Informed written consent was obtained from the patient after a discussion of the risks, benefits, and alternatives to treatment. Questions regarding the procedure were encouraged and answered. The right neck and chest were prepped with chlorhexidine in a sterile fashion, and a sterile drape was applied covering the operative field. Maximum barrier sterile technique with sterile gowns and gloves were used for the procedure. A timeout was performed prior to the initiation of the procedure. After the overlying soft tissues were anesthetized, a small venotomy incision was created and a micropuncture kit was utilized to access the internal jugular vein. Real-time ultrasound guidance was utilized for vascular access including the acquisition of a permanent ultrasound image documenting patency of the accessed vessel. The microwire was utilized to measure appropriate catheter length. A stiff glidewire was advanced to the level of the IVC. Under fluoroscopic guidance, the venotomy was serially dilated, ultimately allowing placement of a 20 cm temporary Trialysis catheter with tip ultimately terminating within the superior aspect of the right atrium. Final catheter positioning was confirmed and documented with a spot radiographic image. The catheter aspirates and flushes normally. The catheter was flushed with appropriate volume heparin dwells. The catheter exit site was secured with a 0-Prolene retention suture. A dressing was placed. The patient tolerated the procedure well without immediate post procedural complication. IMPRESSION: Successful placement of a right internal jugular approach 20 cm temporary dialysis catheter with tip terminating with in the superior aspect of the right atrium. The catheter is ready for immediate use.  PLAN: This catheter may be converted to a tunneled dialysis catheter at a later date as indicated. Electronically Signed   By: Sandi Mariscal M.D.   On: 11/17/2017 12:06   Ir US Guide Vasc Access Right  Result Date: 11/17/2017 INDICATION: End-stage renal disease. Please perform image guided placement of a temporary dialysis catheter for the initiation dialysis. EXAM: NON-TUNNELED CENTRAL VENOUS HEMODIALYSIS CATHETER PLACEMENT WITH ULTRASOUND AND FLUOROSCOPIC GUIDANCE COMPARISON:  None. MEDICATIONS: None FLUOROSCOPY TIME:  18 seconds (2 mGy) COMPLICATIONS: None immediate. PROCEDURE: Informed written consent was obtained from the patient after a discussion of the risks, benefits, and alternatives to treatment. Questions regarding the procedure were encouraged and answered. The right neck and chest were prepped with chlorhexidine in a sterile fashion, and a sterile drape was applied covering the operative field. Maximum barrier sterile technique with sterile gowns and gloves were used for the procedure. A timeout was performed prior to the initiation of the procedure. After the overlying soft tissues were anesthetized, a small venotomy incision was created and a micropuncture kit was utilized to access the internal jugular vein. Real-time ultrasound guidance was utilized for vascular access including the acquisition of a permanent ultrasound image documenting patency of the accessed vessel. The microwire was utilized to measure appropriate catheter length. A stiff glidewire was advanced to the level of the IVC. Under fluoroscopic guidance, the venotomy was serially dilated, ultimately allowing placement of a 20 cm temporary Trialysis catheter with tip ultimately terminating within the superior aspect of the right atrium. Final catheter positioning was confirmed and documented with a spot radiographic image. The catheter aspirates and flushes normally. The catheter was flushed with appropriate volume heparin dwells.  The catheter exit site was secured with a 0-Prolene retention suture. A dressing was placed. The patient tolerated  the procedure well without immediate post procedural complication. IMPRESSION: Successful placement of a right internal jugular approach 20 cm temporary dialysis catheter with tip terminating with in the superior aspect of the right atrium. The catheter is ready for immediate use. PLAN: This catheter may be converted to a tunneled dialysis catheter at a later date as indicated. Electronically Signed   By: Sandi Mariscal M.D.   On: 11/17/2017 12:06     Etta Quill PA-C [facility] [phone]  I have seen the patient reviewed the above note.  On my exam, she has a mild left hemiparesis.  Assessment: 62 year old female with breakthrough seizure after starting dialysis.  While patient was in the hospital she had 2 days of left thigh twitching(occasionally spread to involve arm and face) consistent with simple partial status epilepticus however this has resolved after adding Keppra and Topamax.   She has multifocal infarcts consistent with hypoperfusion event on her MRI of her brain.  With no severe stenosis, I do not think further modification of her medical regimen other than avoiding hypotension would be indicated.   Recommendations: - Continue Keppra 1 g daily with 500 mg additional after dialysis -Continue Dilantin 125 mg twice daily, dosed by pharmacy -Continue Topamax 25 mg twice daily, consider increasing to 50 mill grams twice daily after 2 weeks. - no further stroke work-up needed at this time - She should follow-up with outpatient neurology following discharge.  Roland Rack, MD [facility] [phone]  If 7pm- 7am, please page neurology on call as listed in [facility].   11/18/2017, 5:56 PM

## 2017-11-18 NOTE — Progress Notes (Addendum)
MEDICATION RELATED CONSULT NOTE - INITIAL   Pharmacy Consult for phenytoin dosing Indication: Phenytoin level safety  Allergies  Allergen Reactions  . Aspirin Palpitations    Patient Measurements: Height: 5' (152.4 cm) Weight: 110 lb 3.7 oz (50 kg) IBW/kg (Calculated) : 45.5   Vital Signs: Temp: 98.8 F (37.1 C) (10/03 1554) Temp Source: Oral (10/03 1554) BP: 137/76 (10/03 1554) Pulse Rate: 104 (10/03 1554) Intake/Output from previous day: 10/02 0701 - 10/03 0700 In: 10 [I.V.:10] Out: -  Intake/Output from this shift: Total I/O In: -  Out: 1000 [Other:1000]  Labs: Recent Labs    11/16/17 0514 11/17/17 2245 11/18/17 0434  WBC 14.9*  --  13.5*  HGB 7.2*  --  8.6*  HCT 23.7*  --  28.7*  PLT 259  --  188  CREATININE 5.48* 3.71* 3.93*  PHOS 4.2  --   --   ALBUMIN 1.8*  --   --    Estimated Creatinine Clearance: 10.5 mL/min (A) (by C-G formula based on SCr of 3.93 mg/dL (H)).   Microbiology: Recent Results (from the past 720 hour(s))  Urine culture     Status: Abnormal   Collection Time: 11/08/17  8:07 PM  Result Value Ref Range Status   Specimen Description   Final    URINE, RANDOM Performed at [facility], [address] [place], [place] [address]    Special Requests   Final    NONE Performed at [facility], [address], [place], [place] [address]    Culture >=100,000 COLONIES/mL ESCHERICHIA COLI (A)  Final   Report Status 11/11/2017 FINAL  Final   Organism ID, Bacteria ESCHERICHIA COLI (A)  Final      Susceptibility   Escherichia coli - MIC*    AMPICILLIN >=32 RESISTANT Resistant     CEFAZOLIN <=4 SENSITIVE Sensitive     CEFTRIAXONE <=1 SENSITIVE Sensitive     CIPROFLOXACIN <=0.25 SENSITIVE Sensitive     GENTAMICIN <=1 SENSITIVE Sensitive     IMIPENEM <=0.25 SENSITIVE Sensitive     NITROFURANTOIN <=16 SENSITIVE Sensitive     TRIMETH/SULFA <=20 SENSITIVE Sensitive     AMPICILLIN/SULBACTAM 16 INTERMEDIATE Intermediate     PIP/TAZO <=4  SENSITIVE Sensitive     Extended ESBL NEGATIVE Sensitive     * >=100,000 COLONIES/mL ESCHERICHIA COLI  C difficile quick scan w PCR reflex     Status: None   Collection Time: 11/13/17 11:17 PM  Result Value Ref Range Status   C Diff antigen NEGATIVE NEGATIVE Final   C Diff toxin NEGATIVE NEGATIVE Final   C Diff interpretation No C. difficile detected.  Final    Comment: VALID Performed at [facility], [address], [place], [place] [address]   MRSA PCR Screening     Status: None   Collection Time: 11/14/17  5:26 AM  Result Value Ref Range Status   MRSA by PCR NEGATIVE NEGATIVE Final    Comment:        The GeneXpert MRSA Assay (FDA approved for NASAL specimens only), is one component of a comprehensive MRSA colonization surveillance program. It is not intended to diagnose MRSA infection nor to guide or monitor treatment for MRSA infections. Performed at [facility], [place] [address], [place], [place] [address]   Culture, blood (single)     Status: None (Preliminary result)   Collection Time: 11/16/17  5:10 PM  Result Value Ref Range Status   Specimen Description BLOOD CENTRAL LINE  Final   Special  Requests   Final    BOTTLES DRAWN AEROBIC AND ANAEROBIC Blood Culture results may not be optimal due to an excessive volume of blood received in culture bottles   Culture   Final    NO GROWTH 2 DAYS Performed at [facility], [place] [address], [place], [place] [address]    Report Status PENDING  Incomplete    Medical History: Past Medical History:  Diagnosis Date  . Alcohol-induced pancreatitis   . Chronic diarrhea   . Depression   . Diabetes mellitus    fasting blood sugar 110-120s  . Diastolic CHF ([place])   . DKA (diabetic ketoacidoses) ([place])   . Gastroparesis   . GERD (gastroesophageal reflux disease)   . Heart murmur   . History of kidney stones   . Hyperlipidemia   . Hypertension   . Hypokalemia   . Muscle spasm   . Neuropathic pain   . Neuropathy     Hx: of  . Pyelonephritis   . Recurrent pancreatitis   . Seizures ([place])   . Vitamin B12 deficiency   . Vitamin D deficiency     Medications:  Scheduled:  .  stroke: mapping our early stages of recovery book   Does not apply Once  . sodium chloride   Intravenous Once  . Chlorhexidine Gluconate Cloth  6 each Topical Q0600  . darbepoetin (ARANESP) injection - DIALYSIS  60 mcg Intravenous Q Tue-HD  . feeding supplement (NEPRO CARB STEADY)  237 mL Oral BID BM  . heparin      . heparin injection (subcutaneous)  5,000 Units Subcutaneous Q8H  . insulin aspart  0-9 Units Subcutaneous TID WC  . insulin glargine  4 Units Subcutaneous Daily  . phenytoin  125 mg Oral BID  . potassium chloride  20 mEq Oral BID  . saccharomyces boulardii  250 mg Oral BID  . sodium chloride flush  10-40 mL Intracatheter Q12H  . topiramate  25 mg Oral BID    Assessment: Dialysis pt started on phenytoin inpatient for breakthrough seizures. Neurology has been following pt & wanted pharmacy to take over dosing for Dilantin but consult was never put in.   Total Dilantin level of 12.7 but due to low albumin in ESRD, the corrected phenytoin level was 27. Corrected goal of Dilantin is 10-20. Pt's dose was reduced from 100mg  q8h to 125mg  q12h on 10/2. We will reduce dose today and recheck level in 5 days.  Plan:  Maintain dose of phenytoin at 150mg  q12h. Check level in 3 days.  Erica [name] 11/18/2017,4:04 PM

## 2017-11-19 ENCOUNTER — Inpatient Hospital Stay (HOSPITAL_COMMUNITY): Payer: Medicaid Other

## 2017-11-19 LAB — HEMOGLOBIN A1C
HEMOGLOBIN A1C: 7 % — AB (ref 4.8–5.6)
MEAN PLASMA GLUCOSE: 154 mg/dL

## 2017-11-19 LAB — GLUCOSE, CAPILLARY
GLUCOSE-CAPILLARY: 85 mg/dL (ref 70–99)
Glucose-Capillary: 53 mg/dL — ABNORMAL LOW (ref 70–99)
Glucose-Capillary: 85 mg/dL (ref 70–99)
Glucose-Capillary: 139 mg/dL — ABNORMAL HIGH (ref 70–99)
Glucose-Capillary: 98 mg/dL (ref 70–99)

## 2017-11-19 MED ORDER — DEXTROSE 50 % IV SOLN
INTRAVENOUS | Status: AC
  Filled 2017-11-19: qty 50

## 2017-11-19 MED ORDER — LEVETIRACETAM 500 MG PO TABS
1000.0000 mg | ORAL_TABLET | Freq: Every day | ORAL | Status: DC
  Administered 2017-11-20 – 2017-11-29 (×10): 1000 mg via ORAL
  Filled 2017-11-19 (×10): qty 2

## 2017-11-19 NOTE — Evaluation (Signed)
Modified Barium Swallow Progress Note  Patient Details  Name: Erica [name] MRN: [id number] Date of Birth: 15-Aug-1955  Today's Date: 11/19/2017  Modified Barium Swallow completed.  Full report located under Chart Review in the Imaging Section.  Brief recommendations include the following:  Clinical Impression  Pt presents with oropharyngeal function largely within functional limits with consistent and adequate glottic competence and airway protection noted throughout study. Pt is edentulous and presents with baseline cognitive deficits likely causing noted mildly prolonged bolus manipulation and transit time across consistencies and some lingual pumping. Mildly delayed swallow trigger to the level of the valleculae with solid textures and occasional premature spillage to the pyriforms with mixed consistencies and thin liquids when pressed to take large tablespoon bites of mixed consistencies or consecutive straw sips of thin liquids. Flash penetration of thin liquids reflexively ejected by the end of the swallow on all instances. Pt did not cough during the exam in contrast to presentation at bedside yesterday with the exception of one brief coughing episode with initial tsp trial of mixed consistency that was not visualized; no difficulty was noted with larger additional bites. Secondary to edentulous status recommend continue with dysphagia 2 diet/fine chop and upgrade to thin liquids. ST will follow up X1 for clinical diet check and to review aspiration precautions re: utlize slow rate, small sips and alternate bites and sips prior to signing off.   Swallow Evaluation Recommendations       SLP Diet Recommendations: Dysphagia 2 (Fine chop) solids;Thin liquid   Liquid Administration via: Straw;Cup   Medication Administration: Whole meds with liquid   Supervision: Patient able to self feed;Intermittent supervision to cue for compensatory strategies;Staff to assist with self feeding   Compensations: Minimize environmental distractions;Small sips/bites;Slow rate;Follow solids with liquid   Postural Changes: Remain semi-upright after after feeds/meals (Comment);Seated upright at 90 degrees   Oral Care Recommendations: Oral care BID      Mercer Peifer H. Roddie Mc, [place] Speech Language Pathologist   Wende Bushy 11/19/2017,11:30 AM

## 2017-11-19 NOTE — NC FL2 (Signed)
[place] MEDICAID FL2 LEVEL OF CARE SCREENING TOOL     IDENTIFICATION  Patient Name: Erica [name] Birthdate: [birth year] Sex: female Admission Date (Current Location): 11/08/2017  [facility] and [place] Number:  [employer] and Address:  The [place]. [facility], [place] [address], [place], [place] [address]      Provider Number: [id number]  Attending Physician Name and Address:  Bonnielee Haff, MD  Relative Name and Phone Number:       Current Level of Care: Hospital Recommended Level of Care: [place] Prior Approval Number:    Date Approved/Denied:   PASRR Number: [id number]  Discharge Plan: SNF    Current Diagnoses: Patient Active Problem List   Diagnosis Date Noted  . AKI (acute kidney injury) ([place])   . PICC (peripherally inserted central catheter) in place   . Acute pyelonephritis 11/12/2017  . Acute renal failure superimposed on chronic kidney disease ([place]) 11/10/2017  . Uncontrolled type 2 diabetes mellitus with hyperglycemia, with long-term current use of insulin ([place]) 11/10/2017  . Renal failure 11/08/2017  . Seizure disorder ([place]) 11/08/2017  . Orthostatic hypotension 07/25/2014  . Moderate protein malnutrition ([place]) 09/15/2013  . Aspiration pneumonia ([place]) 09/15/2013  . Acute respiratory failure requiring reintubation ([place]) 09/11/2013  . probable Seizures due to metabolic disorder 18/84/1660  . Lactic acidosis 03/19/2013  . Abdominal pain 03/19/2013  . Rotavirus infection 10/29/2012  . Type II or unspecified type diabetes mellitus without mention of complication, uncontrolled 10/29/2012  . Protein-calorie malnutrition, severe ([place]) 10/27/2012  . NSTEMI (non-ST elevated myocardial infarction) ([place]) 10/26/2012  . Fever, unspecified 10/26/2012  . Hypotension 10/25/2012  . Alcohol withdrawal ([place]) 10/25/2012  . Metabolic acidosis 63/02/6008  . Chronic diarrhea 10/25/2012  . Tobacco abuse 10/25/2012  . DKA  (diabetic ketoacidoses) ([place]) 09/09/2012  . Dehydration 09/09/2012  . DKA, type 2 ([place]) 05/20/2012  . Abnormal LFTs 05/20/2012  . Heart murmur, systolic 93/23/5573  . Diabetes mellitus ([place]) 07/08/2011  . Hypoglycemia 07/08/2011  . Metabolic encephalopathy 22/03/5425  . Alcohol intoxication ([place]) 07/08/2011  . Alcohol abuse 07/08/2011  . Hypokalemia 07/08/2011  . Nausea & vomiting 07/08/2011  . Abdominal pain 07/08/2011  . H/O chronic pancreatitis 07/08/2011    Orientation RESPIRATION BLADDER Height & Weight     Place, Self  Normal Incontinent, Indwelling catheter Weight: 50 kg Height:  5' (152.4 cm)  BEHAVIORAL SYMPTOMS/MOOD NEUROLOGICAL BOWEL NUTRITION STATUS    Convulsions/Seizures(on Keppra and Dilantin and Topamax) Incontinent Diet(dysphagia 2 with thin liquids)  AMBULATORY STATUS COMMUNICATION OF NEEDS Skin   Extensive Assist Verbally Other (Comment)(MASD on buttocks)                       Personal Care Assistance Level of Assistance  Bathing, Feeding, Dressing Bathing Assistance: Maximum assistance Feeding assistance: Limited assistance Dressing Assistance: Maximum assistance     Functional Limitations Info  Sight, Hearing, Speech Sight Info: Adequate Hearing Info: Adequate Speech Info: Adequate    SPECIAL CARE FACTORS FREQUENCY  PT (By licensed PT), OT (By licensed OT), Speech therapy     PT Frequency: 5x/wk OT Frequency: 5x/wk     Speech Therapy Frequency: 5x/wk      Contractures Contractures Info: Not present    Additional Factors Info  Code Status, Allergies, Psychotropic Code Status Info: Full Allergies Info: ASPIRIN  Psychotropic Info: Topamax 25 mg Bid         Current Medications (11/19/2017):  This is the current hospital active medication  list Current Facility-Administered Medications  Medication Dose Route Frequency Provider Last Rate Last Dose  .  stroke: mapping our early stages of recovery book   Does not apply Once Elgergawy,  Silver Huguenin, MD      . 0.9 %  sodium chloride infusion (Manually program via Guardrails IV Fluids)   Intravenous Once Elgergawy, Silver Huguenin, MD      . 0.9 %  sodium chloride infusion   Intravenous PRN Orson Eva, MD   Stopped at 11/11/17 1911  . 0.9 %  sodium chloride infusion   Intravenous Continuous Tat, Shanon Brow, MD 100 mL/hr at 11/19/17 0306    . acetaminophen (TYLENOL) tablet 650 mg  650 mg Oral Q6H PRN Johnson-Pitts, Endia, MD   650 mg at 11/16/17 2046   Or  . acetaminophen (TYLENOL) suppository 650 mg  650 mg Rectal Q6H PRN Johnson-Pitts, Endia, MD   650 mg at 11/16/17 1339  . cefTRIAXone (ROCEPHIN) 2 g in sodium chloride 0.9 % 100 mL IVPB  2 g Intravenous Q24H Orson Eva, MD 200 mL/hr at 11/18/17 2121 2 g at 11/18/17 2121  . Chlorhexidine Gluconate Cloth 2 % PADS 6 each  6 each Topical Q0600 Justin Mend, MD   6 each at 11/18/17 [phone]  . Darbepoetin Alfa (ARANESP) injection 60 mcg  60 mcg Intravenous Q Tue-HD Justin Mend, MD   60 mcg at 11/16/17 1640  . dextrose 50 % solution           . feeding supplement (NEPRO CARB STEADY) liquid 237 mL  237 mL Oral BID BM Elgergawy, Silver Huguenin, MD   237 mL at 11/19/17 1300  . heparin injection 5,000 Units  5,000 Units Subcutaneous Q8H Elgergawy, Silver Huguenin, MD   5,000 Units at 11/19/17 1300  . insulin aspart (novoLOG) injection 0-9 Units  0-9 Units Subcutaneous TID WC Johnson-Pitts, Endia, MD   3 Units at 11/16/17 0724  . levETIRAcetam (KEPPRA) IVPB 500 mg/100 mL premix  500 mg Intravenous Q dialysis Vonzella Nipple, NP      . Derrill Memo ON 11/20/2017] levETIRAcetam (KEPPRA) tablet 1,000 mg  1,000 mg Oral Daily Bonnielee Haff, MD      . loperamide (IMODIUM) capsule 2 mg  2 mg Oral TID PRN Bonnielee Haff, MD   2 mg at 11/18/17 2308  . ondansetron (ZOFRAN) tablet 4 mg  4 mg Oral Q6H PRN Johnson-Pitts, Endia, MD       Or  . ondansetron (ZOFRAN) injection 4 mg  4 mg Intravenous Q6H PRN Johnson-Pitts, Endia, MD   4 mg at 11/11/17 1910  . phenytoin  (DILANTIN) 125 MG/5ML suspension 125 mg  125 mg Oral BID Bonnielee Haff, MD   125 mg at 11/19/17 1036  . RESOURCE THICKENUP CLEAR   Oral PRN Elgergawy, Silver Huguenin, MD      . saccharomyces boulardii (FLORASTOR) capsule 250 mg  250 mg Oral BID Bonnielee Haff, MD   250 mg at 11/19/17 1036  . senna-docusate (Senokot-S) tablet 1 tablet  1 tablet Oral QHS PRN Johnson-Pitts, Endia, MD      . sodium chloride flush (NS) 0.9 % injection 10-40 mL  10-40 mL Intracatheter Q12H Bonnielee Haff, MD   10 mL at 11/18/17 2125  . sodium chloride flush (NS) 0.9 % injection 10-40 mL  10-40 mL Intracatheter PRN Bonnielee Haff, MD   10 mL at 11/18/17 0437  . topiramate (TOPAMAX) tablet 25 mg  25 mg Oral BID Marliss Coots, PA-C   25 mg  at 11/19/17 1036     Discharge Medications: Please see discharge summary for a list of discharge medications.  Relevant Imaging Results:  Relevant Lab Results:   Additional Information patient currently receiving HD T,th, sat.  Awaiting final decision for HD post d/c.   Pollie Friar, RN

## 2017-11-19 NOTE — NC FL2 (Signed)
[place] MEDICAID FL2 LEVEL OF CARE SCREENING TOOL     IDENTIFICATION  Patient Name: Erica [name] Birthdate: [birth year] Sex: female Admission Date (Current Location): 11/08/2017  [facility] and [place] Number:  [employer] and Address:  The . [facility], [place] [address] [place], [place] [address]      Provider Number: [id number]  Attending Physician Name and Address:  Bonnielee Haff, MD  Relative Name and Phone Number:       Current Level of Care: Hospital Recommended Level of Care: [place] Prior Approval Number:    Date Approved/Denied:   PASRR Number: [id number]  Discharge Plan: SNF    Current Diagnoses: Patient Active Problem List   Diagnosis Date Noted  . AKI (acute kidney injury) ([place])   . PICC (peripherally inserted central catheter) in place   . Acute pyelonephritis 11/12/2017  . Acute renal failure superimposed on chronic kidney disease ([place]) 11/10/2017  . Uncontrolled type 2 diabetes mellitus with hyperglycemia, with long-term current use of insulin ([place]) 11/10/2017  . Renal failure 11/08/2017  . Seizure disorder ([place]) 11/08/2017  . Orthostatic hypotension 07/25/2014  . Moderate protein malnutrition ([place]) 09/15/2013  . Aspiration pneumonia ([place]) 09/15/2013  . Acute respiratory failure requiring reintubation ([place]) 09/11/2013  . probable Seizures due to metabolic disorder 31/43/8887  . Lactic acidosis 03/19/2013  . Abdominal pain 03/19/2013  . Rotavirus infection 10/29/2012  . Type II or unspecified type diabetes mellitus without mention of complication, uncontrolled 10/29/2012  . Protein-calorie malnutrition, severe ([place]) 10/27/2012  . NSTEMI (non-ST elevated myocardial infarction) ([place]) 10/26/2012  . Fever, unspecified 10/26/2012  . Hypotension 10/25/2012  . Alcohol withdrawal ([place]) 10/25/2012  . Metabolic acidosis 57/97/2820  . Chronic diarrhea 10/25/2012  . Tobacco abuse 10/25/2012  . DKA  (diabetic ketoacidoses) ([place]) 09/09/2012  . Dehydration 09/09/2012  . DKA, type 2 ([place]) 05/20/2012  . Abnormal LFTs 05/20/2012  . Heart murmur, systolic 60/15/6153  . Diabetes mellitus ([place]) 07/08/2011  . Hypoglycemia 07/08/2011  . Metabolic encephalopathy 79/43/2761  . Alcohol intoxication ([place]) 07/08/2011  . Alcohol abuse 07/08/2011  . Hypokalemia 07/08/2011  . Nausea & vomiting 07/08/2011  . Abdominal pain 07/08/2011  . H/O chronic pancreatitis 07/08/2011    Orientation RESPIRATION BLADDER Height & Weight     Place, Self  Normal Incontinent, Indwelling catheter Weight: 50 kg Height:  5' (152.4 cm)  BEHAVIORAL SYMPTOMS/MOOD NEUROLOGICAL BOWEL NUTRITION STATUS    Convulsions/Seizures(on Keppra and Dilantin and Topamax) Incontinent Diet(dysphagia 2 with thin liquids)  AMBULATORY STATUS COMMUNICATION OF NEEDS Skin   Extensive Assist Verbally Other (Comment)(MASD on buttocks)                       Personal Care Assistance Level of Assistance  Bathing, Feeding, Dressing Bathing Assistance: Maximum assistance Feeding assistance: Limited assistance Dressing Assistance: Maximum assistance     Functional Limitations Info  Sight, Hearing, Speech Sight Info: Adequate Hearing Info: Adequate Speech Info: Adequate    SPECIAL CARE FACTORS FREQUENCY  PT (By licensed PT), OT (By licensed OT), Speech therapy     PT Frequency: 5x/wk OT Frequency: 5x/wk     Speech Therapy Frequency: 5x/wk      Contractures Contractures Info: Not present    Additional Factors Info  Code Status, Allergies, Psychotropic Code Status Info: Full Allergies Info: ASPIRIN  Psychotropic Info: Topamax 25 mg Bid         Current Medications (11/19/2017):  This is the current hospital active medication  list Current Facility-Administered Medications  Medication Dose Route Frequency Provider Last Rate Last Dose  .  stroke: mapping our early stages of recovery book   Does not apply Once Elgergawy,  Silver Huguenin, MD      . 0.9 %  sodium chloride infusion (Manually program via Guardrails IV Fluids)   Intravenous Once Elgergawy, Silver Huguenin, MD      . 0.9 %  sodium chloride infusion   Intravenous PRN Orson Eva, MD   Stopped at 11/11/17 1911  . 0.9 %  sodium chloride infusion   Intravenous Continuous Tat, Shanon Brow, MD 100 mL/hr at 11/19/17 0306    . acetaminophen (TYLENOL) tablet 650 mg  650 mg Oral Q6H PRN Johnson-Pitts, Endia, MD   650 mg at 11/16/17 2046   Or  . acetaminophen (TYLENOL) suppository 650 mg  650 mg Rectal Q6H PRN Johnson-Pitts, Endia, MD   650 mg at 11/16/17 1339  . cefTRIAXone (ROCEPHIN) 2 g in sodium chloride 0.9 % 100 mL IVPB  2 g Intravenous Q24H Orson Eva, MD 200 mL/hr at 11/18/17 2121 2 g at 11/18/17 2121  . Chlorhexidine Gluconate Cloth 2 % PADS 6 each  6 each Topical Q0600 Justin Mend, MD   6 each at 11/18/17 [phone]  . Darbepoetin Alfa (ARANESP) injection 60 mcg  60 mcg Intravenous Q Tue-HD Justin Mend, MD   60 mcg at 11/16/17 1640  . dextrose 50 % solution           . feeding supplement (NEPRO CARB STEADY) liquid 237 mL  237 mL Oral BID BM Elgergawy, Silver Huguenin, MD   237 mL at 11/19/17 1300  . heparin injection 5,000 Units  5,000 Units Subcutaneous Q8H Elgergawy, Silver Huguenin, MD   5,000 Units at 11/19/17 1300  . insulin aspart (novoLOG) injection 0-9 Units  0-9 Units Subcutaneous TID WC Johnson-Pitts, Endia, MD   3 Units at 11/16/17 0724  . levETIRAcetam (KEPPRA) IVPB 1000 mg/100 mL premix  1,000 mg Intravenous Q24H Kerney Elbe, MD 400 mL/hr at 11/19/17 0508 1,000 mg at 11/19/17 0508  . levETIRAcetam (KEPPRA) IVPB 500 mg/100 mL premix  500 mg Intravenous Q dialysis Vonzella Nipple, NP      . loperamide (IMODIUM) capsule 2 mg  2 mg Oral TID PRN Bonnielee Haff, MD   2 mg at 11/18/17 2308  . ondansetron (ZOFRAN) tablet 4 mg  4 mg Oral Q6H PRN Johnson-Pitts, Endia, MD       Or  . ondansetron (ZOFRAN) injection 4 mg  4 mg Intravenous Q6H PRN Johnson-Pitts, Endia, MD   4  mg at 11/11/17 1910  . phenytoin (DILANTIN) 125 MG/5ML suspension 125 mg  125 mg Oral BID Bonnielee Haff, MD   125 mg at 11/19/17 1036  . RESOURCE THICKENUP CLEAR   Oral PRN Elgergawy, Silver Huguenin, MD      . saccharomyces boulardii (FLORASTOR) capsule 250 mg  250 mg Oral BID Bonnielee Haff, MD   250 mg at 11/19/17 1036  . senna-docusate (Senokot-S) tablet 1 tablet  1 tablet Oral QHS PRN Johnson-Pitts, Endia, MD      . sodium chloride flush (NS) 0.9 % injection 10-40 mL  10-40 mL Intracatheter Q12H Bonnielee Haff, MD   10 mL at 11/18/17 2125  . sodium chloride flush (NS) 0.9 % injection 10-40 mL  10-40 mL Intracatheter PRN Bonnielee Haff, MD   10 mL at 11/18/17 0437  . topiramate (TOPAMAX) tablet 25 mg  25 mg Oral BID Marliss Coots,  PA-C   25 mg at 11/19/17 1036     Discharge Medications: Please see discharge summary for a list of discharge medications.  Relevant Imaging Results:  Relevant Lab Results:   Additional Information SS#:  [id number]  Pollie Friar, RN

## 2017-11-19 NOTE — Progress Notes (Signed)
Physical Therapy Treatment Patient Details Name: Erica [name] MRN: [id number] DOB: 1955-09-08 Today's Date: 11/19/2017    History of Present Illness Pt is a 62 y/o female presents with Lft facial, Lft UE and Lft LE twitching at dialysis and admitted with a breakthrough seizure. 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 PMH includes DM with history of DKA, HTN, HLD, CKD, cardiac murmur, chronic diarrhea, nephrolithiasis with history of pyelonephritis, gastroparesis with GERD and a history of recurrent alcohol-associated pancreatitis, and a history of major depression disorder.     PT Comments    Patient progressing slowly towards PT goals. Demonstrates ataxia and impaired coordination of LUE/LE, impaired motor planning and impaired mobility s/p stroke. Also noted to have some cognitive deficits- decreased awareness and impaired problem solving. Pt with left lateral lean during EOB tasks as well as standing activities. Difficulty sequencing LEs to initiate gait and transfer to chair requiring Max A.Pt reports being independent PTA (unsure of accuracy). Discharge recommendation updated to CIR as pt making progress with mobility and would benefit from intensive therapies so pt can maximize independence prior to return home. Will follow.   Follow Up Recommendations  CIR;Supervision for mobility/OOB     Equipment Recommendations  Other (comment)(defer to next venue)    Recommendations for Other Services       Precautions / Restrictions Precautions Precautions: Fall Precaution Comments: seizure Restrictions Weight Bearing Restrictions: No    Mobility  Bed Mobility Overal bed mobility: Needs Assistance Bed Mobility: Rolling;Sidelying to Sit Rolling: Min assist Sidelying to sit: Mod assist;HOB elevated       General bed mobility comments: step by step cues for technique to reach for rail and assist to elevate trunk to get to EOB; pt with left  lateral lean in sitting. Assist to scoot bottom towards EOB.   Transfers Overall transfer level: Needs assistance Equipment used: Rolling walker (2 wheeled);2 person hand held assist Transfers: Sit to/from [facility] Sit to Stand: Mod assist;+2 physical assistance Stand pivot transfers: Max assist;+2 safety/equipment       General transfer comment: Assist to stand from EOB x3 with cues for anterior translation; left lateral lean. SPT bed to chair with Max A, difficulty coordinating movementsof BLEs, ataxia present.  Ambulation/Gait             General Gait Details: Deferred.   Stairs             Wheelchair Mobility    Modified Rankin (Stroke Patients Only) Modified Rankin (Stroke Patients Only) Pre-Morbid Rankin Score: Moderate disability(educated guess) Modified Rankin: Moderately severe disability     Balance Overall balance assessment: Needs assistance Sitting-balance support: Feet supported;Bilateral upper extremity supported Sitting balance-Leahy Scale: Poor Sitting balance - Comments: Pt with LOB towards left and posteriorly during dynamic tasks, left lateral lean. Postural control: Left lateral lean Standing balance support: During functional activity Standing balance-Leahy Scale: Poor Standing balance comment: Requires external support for standing balance. Able to attempt marching in place with Mod A of 2, ataxia noted.                             Cognition Arousal/Alertness: Awake/alert Behavior During Therapy: Flat affect Overall Cognitive Status: No family/caregiver present to determine baseline cognitive functioning Area of Impairment: Orientation;Memory;Following commands;Safety/judgement;Awareness;Problem solving                 Orientation Level: Disoriented to;Time     Following  Commands: Follows one step commands with increased time;Follows multi-step commands inconsistently Safety/Judgement: Decreased  awareness of deficits;Decreased awareness of safety Awareness: Intellectual Problem Solving: Slow processing;Decreased initiation;Difficulty sequencing;Requires verbal cues General Comments: Difficulty with motor planning, sequencing. Knows it is October, "1919"      Exercises      General Comments General comments (skin integrity, edema, etc.): Pt with ataxia in LUE/LE; Grossly ~4/5 throughout BLEs. IMpaired rapid alternating movements durnig heel to shin.       Pertinent Vitals/Pain Pain Assessment: No/denies pain    Home Living                      Prior Function            PT Goals (current goals can now be found in the care plan section) Progress towards PT goals: Progressing toward goals    Frequency    Min 4X/week      PT Plan Discharge plan needs to be updated;Frequency needs to be updated    Co-evaluation PT/OT/SLP Co-Evaluation/Treatment: Yes Reason for Co-Treatment: For patient/therapist safety;To address functional/ADL transfers PT goals addressed during session: Mobility/safety with mobility;Balance        AM-PAC PT "6 Clicks" Daily Activity  Outcome Measure  Difficulty turning over in bed (including adjusting bedclothes, sheets and blankets)?: Unable Difficulty moving from lying on back to sitting on the side of the bed? : Unable Difficulty sitting down on and standing up from a chair with arms (e.g., wheelchair, bedside commode, etc,.)?: Unable Help needed moving to and from a bed to chair (including a wheelchair)?: A Lot Help needed walking in hospital room?: Total Help needed climbing 3-5 steps with a railing? : Total 6 Click Score: 7    End of Session Equipment Utilized During Treatment: Gait belt Activity Tolerance: Patient tolerated treatment well Patient left: in chair;with call bell/phone within reach;with chair alarm set Nurse Communication: Mobility status PT Visit Diagnosis: Hemiplegia and hemiparesis;Difficulty in walking,  not elsewhere classified (R26.2) Hemiplegia - Right/Left: Left Hemiplegia - dominant/non-dominant: Non-dominant Hemiplegia - caused by: Cerebral infarction     Time: 2336-1224 PT Time Calculation (min) (ACUTE ONLY): 27 min  Charges:  $Therapeutic Activity: 8-22 mins                     Wray Kearns, PT, DPT Acute Rehabilitation Services Pager [phone] Office [place] 11/19/2017, 11:55 AM

## 2017-11-19 NOTE — Progress Notes (Signed)
Erica [name]   Subjective:    HD yesterday went fine.  She has no complaints this afternoon. Ate good lunch.  Objective Vitals:   11/18/17 2345 11/19/17 0321 11/19/17 0757 11/19/17 1151  BP: 129/70 138/71 137/71 132/70  Pulse: (!) 106 (!) 101    Resp: 20 20    Temp: 98 F (36.7 C) 98.1 F (36.7 C) 98.2 F (36.8 C) 98 F (36.7 C)  TempSrc: Oral Oral Oral Oral  SpO2: 98% 99%    Weight:      Height:       Physical Exam General: sitting up comfortably Heart: RRR, no rub Lungs: normal WOB, clear anteriorly Abdomen: soft, nontender Extremities: trace to 1+ LE edema GU: +foley draining yellow urine Dialysis Access:  RIJ temp cath c/d/i  Additional Objective Labs: Basic Metabolic Panel: Recent Labs  Lab 11/14/17 1615 11/15/17 0807  11/16/17 0514 11/17/17 2245 11/18/17 0434  NA 135 139   < > 138 140 141  K 3.6 3.7   < > 3.6 3.4* 3.2*  CL 99 103   < > 105 107 109  CO2 23 24   < > 19* 23 26  GLUCOSE 125* 93   < > 202* 107* 74  BUN 22 23   < > 24* 14 14  CREATININE 4.81* 5.08*   < > 5.48* 3.71* 3.93*  CALCIUM 7.0* 7.6*   < > 7.2* 7.6* 7.5*  PHOS 4.4 4.4  --  4.2  --   --    < > = values in this interval not displayed.   Liver Function Tests: Recent Labs  Lab 11/15/17 0807 11/15/17 0924 11/16/17 0514  AST  --  24  --   ALT  --  12  --   ALKPHOS  --  69  --   BILITOT  --  0.9  --   PROT  --  5.8*  --   ALBUMIN 1.9* 1.9* 1.8*   No results for input(s): LIPASE, AMYLASE in the last 168 hours. CBC: Recent Labs  Lab 11/14/17 1045 11/14/17 1615 11/15/17 0807 11/16/17 0514 11/18/17 0434  WBC 12.5* 10.7* 10.3 14.9* 13.5*  HGB 7.8* 7.0* 7.7* 7.2* 8.6*  HCT 25.2* 22.9* 25.9* 23.7* 28.7*  MCV 88.7 89.8 91.8 92.2 89.1  PLT 282 258 242 259 188   Blood Culture    Component Value Date/Time   SDES BLOOD CENTRAL LINE 11/16/2017 1710   SPECREQUEST  11/16/2017 1710    BOTTLES DRAWN AEROBIC AND ANAEROBIC Blood Culture results may not  be optimal due to an excessive volume of blood received in culture bottles   CULT  11/16/2017 1710    NO GROWTH 3 DAYS Performed at [facility], [place] [address], [place], [place] [address]    REPTSTATUS PENDING 11/16/2017 1710    Cardiac Enzymes: Recent Labs  Lab 11/14/17 1045  TROPONINI <0.03   CBG: Recent Labs  Lab 11/18/17 1839 11/18/17 2120 11/19/17 0605 11/19/17 0658 11/19/17 1127  GLUCAP 121* 133* 53* 85 85   Iron Studies:  No results for input(s): IRON, TIBC, TRANSFERRIN, FERRITIN in the last 72 hours. @lablastinr3 @ Studies/Results: Mr Virgel Paling JI Contrast  Result Date: 11/17/2017 CLINICAL DATA:  Confusion for 2-3 days. History of hypertension, diabetes and seizures. EXAM: MRA HEAD WITHOUT CONTRAST TECHNIQUE: Angiographic images of the Circle of Willis were obtained using MRA technique without intravenous contrast. COMPARISON:  MRI head November 15, 2017 FINDINGS: Mild motion degraded examination. ANTERIOR CIRCULATION:  Normal flow related enhancement of the included cervical, petrous, cavernous and supraclinoid internal carotid arteries. Patent anterior communicating artery. Patent anterior and middle cerebral arteries, mild luminal irregularity. No large vessel occlusion, flow limiting stenosis, aneurysm. POSTERIOR CIRCULATION: RIGHT vertebral artery is dominant. Vertebrobasilar arteries are patent, with normal flow related enhancement of the main branch vessels. Stenotic RIGHT P1 segment with predominant RIGHT vascular supply provided by posterior communicating artery. Small LEFT posterior communicating artery present. Patent posterior cerebral arteries, mild luminal irregularity. No large vessel occlusion, flow limiting stenosis,  aneurysm. ANATOMIC VARIANTS: None. Source images and MIP images were reviewed. IMPRESSION: 1. Motion degraded examination. No emergent large vessel occlusion or flow-limiting stenosis. 2. Mild intracranial atherosclerosis versus motion  artifact. Electronically Signed   By: Elon Alas M.D.   On: 11/17/2017 22:24   Dg Swallowing Func-speech Pathology  Result Date: 11/19/2017 Objective Swallowing Evaluation: Type of Study: MBS-Modified Barium Swallow Study  Patient Details Name: Erica [name] MRN: [id number] Date of Birth: Mar 04, 1955 Today's Date: 11/19/2017 Time: SLP Start Time (ACUTE ONLY): 3536 -SLP Stop Time (ACUTE ONLY): 0941 SLP Time Calculation (min) (ACUTE ONLY): 19 min Past Medical History: Past Medical History: Diagnosis Date . Alcohol-induced pancreatitis  . Chronic diarrhea  . Depression  . Diabetes mellitus   fasting blood sugar 110-120s . Diastolic CHF ([place])  . DKA (diabetic ketoacidoses) ([place])  . Gastroparesis  . GERD (gastroesophageal reflux disease)  . Heart murmur  . History of kidney stones  . Hyperlipidemia  . Hypertension  . Hypokalemia  . Muscle spasm  . Neuropathic pain  . Neuropathy   Hx: of . Pyelonephritis  . Recurrent pancreatitis  . Seizures ([place])  . Vitamin B12 deficiency  . Vitamin D deficiency  Past Surgical History: Past Surgical History: Procedure Laterality Date . CATARACT EXTRACTION W/PHACO Right 03/14/2015  Procedure: CATARACT EXTRACTION PHACO AND INTRAOCULAR LENS PLACEMENT (IOC);  Surgeon: Baruch Goldmann, MD;  Location: [facility];  Service: Ophthalmology;  Laterality: Right;  CDE:11.13 . CATARACT EXTRACTION W/PHACO Left 04/11/2015  Procedure: CATARACT EXTRACTION PHACO AND INTRAOCULAR LENS PLACEMENT LEFT EYE CDE=9.68;  Surgeon: Baruch Goldmann, MD;  Location: [facility];  Service: Ophthalmology;  Laterality: Left; . COLONOSCOPY  02/24/2010 . cystoscopy with ureteral stent  Bilateral 10/07/2017  At [place] CV LINE RIGHT  11/17/2017 . IR US GUIDE VASC ACCESS RIGHT  11/17/2017 . MULTIPLE EXTRACTIONS WITH ALVEOLOPLASTY N/A 06/13/2012  Procedure: MULTIPLE EXTRACION WITH ALVEOLOPLASTY EXTRACT: 18, 19, 20, 21, 22, 24, 25, 27, 28, 29, 30, 31;  Surgeon: Gae Bon, DDS;  Location: [facility];   Service: Oral Surgery;  Laterality: N/A; . TUBAL LIGATION   . URETERAL STENT PLACEMENT  09/2017 HPI: 62 year old female admitted 11/08/17 with weakness. Transferred to [facility] 11/15/27 due to seizure activity. PMH: DM2, HTN, seizure disorder, alcohol induced pancreatitis, GERD, HLD. MRI = Scattered punctate foci of acute infarction, c/w watershed infarction. MBS recommended to objectively assess the swallowing function and determine LRD secondary to overt coughing on thin liquids and coughing with D2 textures following thin liquids during MBS 11/18/2017.  Subjective: Pt resting in bed. No family present Assessment / Plan / Recommendation CHL IP CLINICAL IMPRESSIONS 11/19/2017 Clinical Impression Pt presents with oropharyngeal function largely within functional limits with consistent and adequate glottic competence and airway protection noted throughout study. Pt is edentulous and presents with baseline cognitive deficits likely causing noted mildly prolonged bolus manipulation and transit time across consistencies and some lingual pumping. Mildly delayed swallow trigger  to the level of the valleculae with solid textures and occasional premature spillage to the pyriforms with mixed consistencies and thin liquids when pressed to take large tablespoon bites of mixed consistencies or consecutive straw sips of thin liquids. Flash penetration of thin liquids reflexively ejected by the end of the swallow on all instances. Pt did not cough during the exam in contrast to presentation at bedside yesterday with the exception of one brief coughing episode with initial tsp trial of mixed consistency that was not visualized; no difficulty was noted with larger additional bites. Secondary to edentulous status recommend continue with dysphagia 2 diet/fine chop and upgrade to thin liquids. ST will follow up X1 for clinical diet check and to review aspiration precautions re: utlize slow rate, small sips and alternate bites and sips prior to  signing off. SLP Visit Diagnosis Dysphagia, unspecified (R13.10) Attention and concentration deficit following -- Frontal lobe and executive function deficit following -- Impact on safety and function Mild aspiration risk   CHL IP TREATMENT RECOMMENDATION 11/19/2017 Treatment Recommendations Therapy as outlined in treatment plan below   Prognosis 11/19/2017 Prognosis for Safe Diet Advancement Fair Barriers to Reach Goals Cognitive deficits Barriers/Prognosis Comment -- CHL IP DIET RECOMMENDATION 11/19/2017 SLP Diet Recommendations Dysphagia 2 (Fine chop) solids;Thin liquid Liquid Administration via Straw;Cup Medication Administration Whole meds with liquid Compensations Minimize environmental distractions;Small sips/bites;Slow rate;Follow solids with liquid Postural Changes Remain semi-upright after after feeds/meals (Comment);Seated upright at 90 degrees   CHL IP OTHER RECOMMENDATIONS 11/19/2017 Recommended Consults -- Oral Care Recommendations Oral care BID Other Recommendations --   CHL IP FOLLOW UP RECOMMENDATIONS 11/19/2017 Follow up Recommendations None   CHL IP FREQUENCY AND DURATION 11/19/2017 Speech Therapy Frequency (ACUTE ONLY) min 1 x/week Treatment Duration 2 weeks      CHL IP ORAL PHASE 11/19/2017 Oral Phase Impaired Oral - Pudding Teaspoon -- Oral - Pudding Cup -- Oral - Honey Teaspoon -- Oral - Honey Cup -- Oral - Nectar Teaspoon -- Oral - Nectar Cup -- Oral - Nectar Straw -- Oral - Thin Teaspoon WFL Oral - Thin Cup WFL Oral - Thin Straw Lingual pumping;Premature spillage Oral - Puree Lingual pumping;Reduced posterior propulsion Oral - Mech Soft Lingual pumping;Reduced posterior propulsion Oral - Regular -- Oral - Multi-Consistency -- Oral - Pill WFL Oral Phase - Comment --  CHL IP PHARYNGEAL PHASE 11/19/2017 Pharyngeal Phase -- Pharyngeal- Pudding Teaspoon -- Pharyngeal -- Pharyngeal- Pudding Cup -- Pharyngeal -- Pharyngeal- Honey Teaspoon -- Pharyngeal -- Pharyngeal- Honey Cup -- Pharyngeal -- Pharyngeal-  Nectar Teaspoon -- Pharyngeal -- Pharyngeal- Nectar Cup -- Pharyngeal -- Pharyngeal- Nectar Straw -- Pharyngeal -- Pharyngeal- Thin Teaspoon -- Pharyngeal -- Pharyngeal- Thin Cup -- Pharyngeal -- Pharyngeal- Thin Straw Delayed swallow initiation-pyriform sinuses;Penetration/Aspiration before swallow Pharyngeal Material enters airway, remains ABOVE vocal cords then ejected out Pharyngeal- Puree WFL Pharyngeal -- Pharyngeal- Mechanical Soft -- Pharyngeal -- Pharyngeal- Regular WFL Pharyngeal -- Pharyngeal- Multi-consistency -- Pharyngeal -- Pharyngeal- Pill WFL Pharyngeal -- Pharyngeal Comment --  No flowsheet data found. Amelia H. Roddie Mc, CCC-SLP Speech Language Pathologist Wende Bushy 11/19/2017, 11:31 AM              Medications: . sodium chloride Stopped (11/11/17 1911)  . sodium chloride 100 mL/hr at 11/19/17 0306  . cefTRIAXone (ROCEPHIN)  IV 2 g (11/18/17 2121)  . levETIRAcetam     .  stroke: mapping our early stages of recovery book   Does not apply Once  . sodium chloride   Intravenous Once  .  Chlorhexidine Gluconate Cloth  6 each Topical Q0600  . darbepoetin (ARANESP) injection - DIALYSIS  60 mcg Intravenous Q Tue-HD  . dextrose      . feeding supplement (NEPRO CARB STEADY)  237 mL Oral BID BM  . heparin injection (subcutaneous)  5,000 Units Subcutaneous Q8H  . insulin aspart  0-9 Units Subcutaneous TID WC  . [START ON 11/20/2017] levETIRAcetam  1,000 mg Oral Daily  . phenytoin  125 mg Oral BID  . saccharomyces boulardii  250 mg Oral BID  . sodium chloride flush  10-40 mL Intracatheter Q12H  . topiramate  25 mg Oral BID     Assessment/Plan:  1.  AKI on CKD:  Recent course with emphysematous pyelonephritis treated at [facility], urologic intervention double J stents placed.  During that admission Cr ~ 8 on presentation, down to 4 on discharge.  Represented 2 weeks later with Cr up to 11.  She was initially hydrated but due lack of improved had HD x 2 at [facility] prior to  transfer here on 9/29. She has required ongoing HD here, another tx yesterday.  Inserted a foley this AM to collect UOP and a 24h creatinine clearance - her low muscle mass makes it difficult to interpret GFR.  If she continues to need dialysis she will need her line tunneled.    Given fever 10/1we removed her femoral HD catheter following HD.  Blood culture is negative to date.   2.   HTN:   Reasonable control for now.  BP has ranged 130-140 in past 24h. She is on no antiHTN here.   3.  Anemia:  Hb has trended down significantly from 10.1 on 9/23 to 7.2 and she rec'd transfusion 10/1.  Stable 10/3 at 8.6. Iron indices 9/30 show iron replete.  Hemoccult + and started on PPI but no signs of gross bleeding.  Given aranesp 26 with HD 10/1.    4.  BMM: phos 4.2, corr ca normal (albumin 1.8).   5. Seizures:  H/o hypoglycemia induced seizures; neurology following here and dosing meds for dialysis at this time.  She appears to have improved.    6.  E coli UTI: Rocephin 2g daily since 9/26.  Has ureteral stents in place from prior hospitalization.  Urology supposed to remove while she is here.    7.  DM: per primary.   8.  Hypokalemia: Used 3K bath and gave 40po yesterday.    Jannifer Hick MD 11/19/2017, 2:51 PM  [facility]

## 2017-11-19 NOTE — Progress Notes (Signed)
Occupational Therapy Treatment Patient Details Name: Erica [name] MRN: [id number] DOB: 1955-08-31 Today's Date: 11/19/2017    History of present illness Pt is a 62 y/o female presents with Lft facial, Lft UE and Lft LE twitching at dialysis and admitted with a breakthrough seizure. 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 PMH includes DM with history of DKA, HTN, HLD, CKD, cardiac murmur, chronic diarrhea, nephrolithiasis with history of pyelonephritis, gastroparesis with GERD and a history of recurrent alcohol-associated pancreatitis, and a history of major depression disorder.    OT comments  Pt progressing slowly towards established OT goals.  Patient presents with ataxia and impaired coordination of L UE/LE, impaired cognition and impaired motor planning/sequencing (noted as patient able to engage more in session and exit bed).  Patient presents with L lateral lean with dynamic sitting tasks and static standing. Updated dc plan for CIR in order to maximize independence prior to return home, as patient is highly motivated and progressing well acutely.  Will continue to follow.       Follow Up Recommendations  Supervision/Assistance - 24 hour;CIR    Equipment Recommendations  Other (comment)(TBD at next level of care)    Recommendations for Other Services      Precautions / Restrictions Precautions Precautions: Fall Precaution Comments: seizure Restrictions Weight Bearing Restrictions: No       Mobility Bed Mobility Overal bed mobility: Needs Assistance Bed Mobility: Rolling;Sidelying to Sit Rolling: Min assist Sidelying to sit: Mod assist;HOB elevated       General bed mobility comments: pt requires 1 step cueing to complete bed mobility.   Transfers Overall transfer level: Needs assistance Equipment used: Rolling walker (2 wheeled);2 person hand held assist Transfers: Sit to/from [facility] Sit to Stand: Mod  assist;+2 physical assistance Stand pivot transfers: Max assist;+2 safety/equipment       General transfer comment: sit to stand at EOB x 3 with left lateral lean, Stand pivot to recliner with maxA     Balance Overall balance assessment: Needs assistance Sitting-balance support: Feet supported;Bilateral upper extremity supported Sitting balance-Leahy Scale: Poor Sitting balance - Comments: Pt with LOB towards left and posteriorly during dynamic tasks, left lateral lean. Postural control: Left lateral lean Standing balance support: During functional activity;Bilateral upper extremity supported Standing balance-Leahy Scale: Poor Standing balance comment: Requires external support for standing balance. Able to attempt marching in place with Mod A of 2, ataxia noted.                            ADL either performed or assessed with clinical judgement   ADL Overall ADL's : Needs assistance/impaired     Grooming: Sitting;Minimal assistance Grooming Details (indicate cue type and reason): min assist to maintain balance with 0 hand support while engaging in grooming tasks                 Toilet Transfer: Maximal assistance;Stand-pivot(simulated to recliner )           Functional mobility during ADLs: Maximal assistance;+2 for physical assistance       Vision       Perception     Praxis      Cognition Arousal/Alertness: Awake/alert Behavior During Therapy: Flat affect Overall Cognitive Status: No family/caregiver present to determine baseline cognitive functioning Area of Impairment: Orientation;Memory;Following commands;Safety/judgement;Awareness;Problem solving                 Orientation Level:  Disoriented to;Time   Memory: Decreased short-term memory Following Commands: Follows one step commands with increased time;Follows multi-step commands inconsistently Safety/Judgement: Decreased awareness of deficits;Decreased awareness of  safety Awareness: Intellectual Problem Solving: Slow processing;Decreased initiation;Difficulty sequencing;Requires verbal cues General Comments: Difficulty with motor planning, sequencing. Knows it is October, "1919"        Exercises     Shoulder Instructions       General Comments ataxic and dsymetric L UE     Pertinent Vitals/ Pain       Pain Assessment: No/denies pain  Home Living                                          Prior Functioning/Environment              Frequency  Min 2X/week        Progress Toward Goals  OT Goals(current goals can now be found in the care plan section)  Progress towards OT goals: Progressing toward goals  Acute Rehab OT Goals Patient Stated Goal: to get stronger OT Goal Formulation: With patient Time For Goal Achievement: 12/01/17 Potential to Achieve Goals: [place] Frequency remains appropriate;Discharge plan needs to be updated    Co-evaluation    PT/OT/SLP Co-Evaluation/Treatment: Yes Reason for Co-Treatment: Complexity of the patient's impairments (multi-system involvement);For patient/therapist safety;To address functional/ADL transfers PT goals addressed during session: Mobility/safety with mobility;Balance OT goals addressed during session: ADL's and self-care;Other (comment)(mobility)      AM-PAC PT "6 Clicks" Daily Activity     Outcome Measure   Help from another person eating meals?: A Lot Help from another person taking care of personal grooming?: A Little Help from another person toileting, which includes using toliet, bedpan, or urinal?: Total Help from another person bathing (including washing, rinsing, drying)?: A Lot Help from another person to put on and taking off regular upper body clothing?: A Lot Help from another person to put on and taking off regular lower body clothing?: Total 6 Click Score: 11    End of Session Equipment Utilized During Treatment: Gait belt;Rolling  walker  OT Visit Diagnosis: Other abnormalities of gait and mobility (R26.89);Muscle weakness (generalized) (M62.81);Other symptoms and signs involving cognitive function   Activity Tolerance Patient tolerated treatment well   Patient Left with call bell/phone within reach;in chair;with chair alarm set   Nurse Communication Mobility status        Time: 2119-4174 OT Time Calculation (min): 27 min  Charges: OT General Charges $OT Visit: 1 Visit OT Treatments $Self Care/Home Management : 8-22 mins  Delight Stare, [place] Pager [phone] Office [phone]    Delight Stare 11/19/2017, 1:01 PM

## 2017-11-19 NOTE — Progress Notes (Signed)
Reassessed patient FSGB 85. Will continue to monitor.

## 2017-11-19 NOTE — Plan of Care (Signed)

## 2017-11-19 NOTE — Progress Notes (Addendum)
[facility] PROGRESS NOTE  Erica [name] LKG:[id number] DOB: 1955/09/10 DOA: 11/08/2017  PCP: Octavio Graves, DO  Brief History/Interval Summary: 62 year old African-American female with past medical history of diabetes mellitus type 2, essential hypertension, history of seizure disorder presented with 2 to 3 days of confusion. The patient was also complaining of lower abd pain with dysuria.The patient was recently hospitalized at [facility] from 10/07/2017 through 10/21/2017 during which she was treated for emphysematous pyelonephritis and acute on chronic renal failure. She also was treated for DKA and fluid overload.She was noted to have hemoglobin A1c of 12.1. She required transfusion 2 u PRBC without evidence of active bleeding.She underwent bilateral ureteral stenting on 10/07/2017. She was treated appropriately with antibiotics. She was discharged with instructions for outpatient cystoscopy and removal of stents in 2 to 3 weeks. Upon thisadmission, the patient has serum creatinine of 8.1. At the time of discharge, her serum creatinine has stabilized between 3.7-3.9.Nephrology was consulted to assist with management. Urology was also consulted due to retained stents--they are currently planning to remove stents. Patient had seizures 11/14/2017, she was transferred to [facility] for further work-up , her MRI brain significant for scattered infarcts, as well her seizures has been managed by neurology, hemodialysis has been continued during this hospital stay.   Consultants: Nephrology.  Urology.  Neurology.  Procedures:   Transthoracic echocardiogram Study Conclusions  - Left ventricle: The cavity size was normal. Moderate focal basal   septal hypertrophy. Mild hypertrophy of the rest of the   ventricle. No LV outflow tract gradient noted. Systolic function   was normal. The estimated ejection fraction was in the range of   60% to 65%. Wall motion was  normal; there were no regional wall   motion abnormalities. Doppler parameters are consistent with   abnormal left ventricular relaxation (grade 1 diastolic   dysfunction). - Aortic valve: There was no stenosis. There was trivial   regurgitation. - Mitral valve: There was chordal systolic anterior motion but not   valvular SAM. Moderately calcified annulus. There was trivial   regurgitation. - Right ventricle: The cavity size was normal. Systolic function   was normal. - Right atrium: Catheter noted in RA. - Tricuspid valve: Peak RV-RA gradient (S): 47 mm Hg. - Pulmonary arteries: PA peak pressure: 50 mm Hg (S). - Inferior vena cava: The vessel was normal in size. The   respirophasic diameter changes were in the normal range (>= 50%),   consistent with normal central venous pressure.  Impressions:  - Normal LV size with moderate asymmetric basal septal hypertrophy,   mild hypertrophy of the rest of the ventricle. No LVOT gradient.   There was chordal but not mitral valve SAM. Normal RV size and   systolic function. No significant valvular abnormalities.   Differential is LVH due to HTN versus a variant of hypertrophic   cardiomyopathy.  Antibiotics: Ceftriaxone 9/23  Subjective/Interval History: Patient denies any complaints.  No chest pain or shortness of breath.  ROS: No nausea or vomiting  Objective:  Vital Signs  Vitals:   11/18/17 2345 11/19/17 0321 11/19/17 0757 11/19/17 1151  BP: 129/70 138/71 137/71 132/70  Pulse: (!) 106 (!) 101    Resp: 20 20    Temp: 98 F (36.7 C) 98.1 F (36.7 C) 98.2 F (36.8 C) 98 F (36.7 C)  TempSrc: Oral Oral Oral Oral  SpO2: 98% 99%    Weight:      Height:  No intake or output data in the 24 hours ending 11/19/17 1351 Filed Weights   11/13/17 2230 11/16/17 1505 11/16/17 1805  Weight: 50 kg 50.4 kg 50 kg    General appearance: Awake alert.  Slightly distracted.  In no distress. Resp: Clear to auscultation  bilaterally.  No wheezing rales or rhonchi Cardio: S1-S2 is normal regular.  No S3-S4.  No rubs murmurs or bruit GI: Soft.  Nontender nondistended Extremities: No pedal edema Neurologic: No obvious cranial nerve deficits.  Still has subtle weakness in the left side.  Lab Results:  Data Reviewed: I have personally reviewed following labs and imaging studies  CBC: Recent Labs  Lab 11/14/17 1045 11/14/17 1615 11/15/17 0807 11/16/17 0514 11/18/17 0434  WBC 12.5* 10.7* 10.3 14.9* 13.5*  HGB 7.8* 7.0* 7.7* 7.2* 8.6*  HCT 25.2* 22.9* 25.9* 23.7* 28.7*  MCV 88.7 89.8 91.8 92.2 89.1  PLT 282 258 242 259 354    Basic Metabolic Panel: Recent Labs  Lab 11/13/17 0824  11/14/17 0656  11/14/17 1615 11/15/17 0807 11/15/17 0924 11/16/17 0514 11/17/17 2245 11/18/17 0434  NA 135   < > 138   < > 135 139 137 138 140 141  K 3.0*   < > 3.6   < > 3.6 3.7 3.6 3.6 3.4* 3.2*  CL 93*   < > 97*   < > 99 103 101 105 107 109  CO2 30   < > 27   < > 23 24 22  19* 23 26  GLUCOSE 130*   < > 80   < > 125* 93 88 202* 107* 74  BUN 39*   < > 23   < > 22 23 23  24* 14 14  CREATININE 6.55*   < > 4.57*   < > 4.81* 5.08* 5.06* 5.48* 3.71* 3.93*  CALCIUM 5.9*   < > 7.1*   < > 7.0* 7.6* 7.4* 7.2* 7.6* 7.5*  PHOS 3.5  --  3.6  --  4.4 4.4  --  4.2  --   --    < > = values in this interval not displayed.    GFR: Estimated Creatinine Clearance: 10.5 mL/min (A) (by C-G formula based on SCr of 3.93 mg/dL (H)).  Liver Function Tests: Recent Labs  Lab 11/14/17 0656 11/14/17 1615 11/15/17 0807 11/15/17 0924 11/16/17 0514  AST  --   --   --  24  --   ALT  --   --   --  12  --   ALKPHOS  --   --   --  69  --   BILITOT  --   --   --  0.9  --   PROT  --   --   --  5.8*  --   ALBUMIN 2.0* 1.8* 1.9* 1.9* 1.8*    Coagulation Profile: Recent Labs  Lab 11/14/17 1045  INR 1.47    Cardiac Enzymes: Recent Labs  Lab 11/14/17 1045  TROPONINI <0.03    CBG: Recent Labs  Lab 11/18/17 1839 11/18/17 2120  11/19/17 0605 11/19/17 0658 11/19/17 1127  GLUCAP 121* 133* 53* 85 85     Recent Results (from the past 240 hour(s))  C difficile quick scan w PCR reflex     Status: None   Collection Time: 11/13/17 11:17 PM  Result Value Ref Range Status   C Diff antigen NEGATIVE NEGATIVE Final   C Diff toxin NEGATIVE NEGATIVE Final   C Diff interpretation No C.  difficile detected.  Final    Comment: VALID Performed at [facility], [address] [place], [place] [address]   MRSA PCR Screening     Status: None   Collection Time: 11/14/17  5:26 AM  Result Value Ref Range Status   MRSA by PCR NEGATIVE NEGATIVE Final    Comment:        The GeneXpert MRSA Assay (FDA approved for NASAL specimens only), is one component of a comprehensive MRSA colonization surveillance program. It is not intended to diagnose MRSA infection nor to guide or monitor treatment for MRSA infections. Performed at [facility], [place] [address], [place], [place] [address]   Culture, blood (single)     Status: None (Preliminary result)   Collection Time: 11/16/17  5:10 PM  Result Value Ref Range Status   Specimen Description BLOOD CENTRAL LINE  Final   Special Requests   Final    BOTTLES DRAWN AEROBIC AND ANAEROBIC Blood Culture results may not be optimal due to an excessive volume of blood received in culture bottles   Culture   Final    NO GROWTH 3 DAYS Performed at [facility], [place] [address] [place], [place] [address]    Report Status PENDING  Incomplete      Radiology Studies: Mr Virgel Paling PI Contrast  Result Date: 11/17/2017 CLINICAL DATA:  Confusion for 2-3 days. History of hypertension, diabetes and seizures. EXAM: MRA HEAD WITHOUT CONTRAST TECHNIQUE: Angiographic images of the Circle of Willis were obtained using MRA technique without intravenous contrast. COMPARISON:  MRI head November 15, 2017 FINDINGS: Mild motion degraded examination. ANTERIOR CIRCULATION: Normal flow related enhancement  of the included cervical, petrous, cavernous and supraclinoid internal carotid arteries. Patent anterior communicating artery. Patent anterior and middle cerebral arteries, mild luminal irregularity. No large vessel occlusion, flow limiting stenosis, aneurysm. POSTERIOR CIRCULATION: RIGHT vertebral artery is dominant. Vertebrobasilar arteries are patent, with normal flow related enhancement of the main branch vessels. Stenotic RIGHT P1 segment with predominant RIGHT vascular supply provided by posterior communicating artery. Small LEFT posterior communicating artery present. Patent posterior cerebral arteries, mild luminal irregularity. No large vessel occlusion, flow limiting stenosis,  aneurysm. ANATOMIC VARIANTS: None. Source images and MIP images were reviewed. IMPRESSION: 1. Motion degraded examination. No emergent large vessel occlusion or flow-limiting stenosis. 2. Mild intracranial atherosclerosis versus motion artifact. Electronically Signed   By: Elon Alas M.D.   On: 11/17/2017 22:24   Dg Swallowing Func-speech Pathology  Result Date: 11/19/2017 Objective Swallowing Evaluation: Type of Study: MBS-Modified Barium Swallow Study  Patient Details Name: CASHAY MANGANELLI MRN: [id number] Date of Birth: Jun 08, 1955 Today's Date: 11/19/2017 Time: SLP Start Time (ACUTE ONLY): 0630 -SLP Stop Time (ACUTE ONLY): 0941 SLP Time Calculation (min) (ACUTE ONLY): 19 min Past Medical History: Past Medical History: Diagnosis Date . Alcohol-induced pancreatitis  . Chronic diarrhea  . Depression  . Diabetes mellitus   fasting blood sugar 110-120s . Diastolic CHF ([place])  . DKA (diabetic ketoacidoses) ([place])  . Gastroparesis  . GERD (gastroesophageal reflux disease)  . Heart murmur  . History of kidney stones  . Hyperlipidemia  . Hypertension  . Hypokalemia  . Muscle spasm  . Neuropathic pain  . Neuropathy   Hx: of . Pyelonephritis  . Recurrent pancreatitis  . Seizures ([place])  . Vitamin B12 deficiency  . Vitamin D  deficiency  Past Surgical History: Past Surgical History: Procedure Laterality Date . CATARACT EXTRACTION W/PHACO Right 03/14/2015  Procedure: CATARACT EXTRACTION PHACO AND  INTRAOCULAR LENS PLACEMENT (IOC);  Surgeon: Baruch Goldmann, MD;  Location: [facility];  Service: Ophthalmology;  Laterality: Right;  CDE:11.13 . CATARACT EXTRACTION W/PHACO Left 04/11/2015  Procedure: CATARACT EXTRACTION PHACO AND INTRAOCULAR LENS PLACEMENT LEFT EYE CDE=9.68;  Surgeon: Baruch Goldmann, MD;  Location: [facility];  Service: Ophthalmology;  Laterality: Left; . COLONOSCOPY  02/24/2010 . cystoscopy with ureteral stent  Bilateral 10/07/2017  At [place] CV LINE RIGHT  11/17/2017 . IR US GUIDE VASC ACCESS RIGHT  11/17/2017 . MULTIPLE EXTRACTIONS WITH ALVEOLOPLASTY N/A 06/13/2012  Procedure: MULTIPLE EXTRACION WITH ALVEOLOPLASTY EXTRACT: 18, 19, 20, 21, 22, 24, 25, 27, 28, 29, 30, 31;  Surgeon: Gae Bon, DDS;  Location: [place];  Service: Oral Surgery;  Laterality: N/A; . TUBAL LIGATION   . URETERAL STENT PLACEMENT  09/2017 HPI: 62 year old female admitted 11/08/17 with weakness. Transferred to [facility] 11/15/27 due to seizure activity. PMH: DM2, HTN, seizure disorder, alcohol induced pancreatitis, GERD, HLD. MRI = Scattered punctate foci of acute infarction, c/w watershed infarction. MBS recommended to objectively assess the swallowing function and determine LRD secondary to overt coughing on thin liquids and coughing with D2 textures following thin liquids during MBS 11/18/2017.  Subjective: Pt resting in bed. No family present Assessment / Plan / Recommendation CHL IP CLINICAL IMPRESSIONS 11/19/2017 Clinical Impression Pt presents with oropharyngeal function largely within functional limits with consistent and adequate glottic competence and airway protection noted throughout study. Pt is edentulous and presents with baseline cognitive deficits likely causing noted mildly prolonged bolus manipulation and transit time across  consistencies and some lingual pumping. Mildly delayed swallow trigger to the level of the valleculae with solid textures and occasional premature spillage to the pyriforms with mixed consistencies and thin liquids when pressed to take large tablespoon bites of mixed consistencies or consecutive straw sips of thin liquids. Flash penetration of thin liquids reflexively ejected by the end of the swallow on all instances. Pt did not cough during the exam in contrast to presentation at bedside yesterday with the exception of one brief coughing episode with initial tsp trial of mixed consistency that was not visualized; no difficulty was noted with larger additional bites. Secondary to edentulous status recommend continue with dysphagia 2 diet/fine chop and upgrade to thin liquids. ST will follow up X1 for clinical diet check and to review aspiration precautions re: utlize slow rate, small sips and alternate bites and sips prior to signing off. SLP Visit Diagnosis Dysphagia, unspecified (R13.10) Attention and concentration deficit following -- Frontal lobe and executive function deficit following -- Impact on safety and function Mild aspiration risk   CHL IP TREATMENT RECOMMENDATION 11/19/2017 Treatment Recommendations Therapy as outlined in treatment plan below   Prognosis 11/19/2017 Prognosis for Safe Diet Advancement Fair Barriers to Reach Goals Cognitive deficits Barriers/Prognosis Comment -- CHL IP DIET RECOMMENDATION 11/19/2017 SLP Diet Recommendations Dysphagia 2 (Fine chop) solids;Thin liquid Liquid Administration via Straw;Cup Medication Administration Whole meds with liquid Compensations Minimize environmental distractions;Small sips/bites;Slow rate;Follow solids with liquid Postural Changes Remain semi-upright after after feeds/meals (Comment);Seated upright at 90 degrees   CHL IP OTHER RECOMMENDATIONS 11/19/2017 Recommended Consults -- Oral Care Recommendations Oral care BID Other Recommendations --   CHL IP  FOLLOW UP RECOMMENDATIONS 11/19/2017 Follow up Recommendations None   CHL IP FREQUENCY AND DURATION 11/19/2017 Speech Therapy Frequency (ACUTE ONLY) min 1 x/week Treatment Duration 2 weeks      CHL IP ORAL PHASE 11/19/2017 Oral Phase Impaired Oral - Pudding Teaspoon --  Oral - Pudding Cup -- Oral - Honey Teaspoon -- Oral - Honey Cup -- Oral - Nectar Teaspoon -- Oral - Nectar Cup -- Oral - Nectar Straw -- Oral - Thin Teaspoon WFL Oral - Thin Cup WFL Oral - Thin Straw Lingual pumping;Premature spillage Oral - Puree Lingual pumping;Reduced posterior propulsion Oral - Mech Soft Lingual pumping;Reduced posterior propulsion Oral - Regular -- Oral - Multi-Consistency -- Oral - Pill WFL Oral Phase - Comment --  CHL IP PHARYNGEAL PHASE 11/19/2017 Pharyngeal Phase -- Pharyngeal- Pudding Teaspoon -- Pharyngeal -- Pharyngeal- Pudding Cup -- Pharyngeal -- Pharyngeal- Honey Teaspoon -- Pharyngeal -- Pharyngeal- Honey Cup -- Pharyngeal -- Pharyngeal- Nectar Teaspoon -- Pharyngeal -- Pharyngeal- Nectar Cup -- Pharyngeal -- Pharyngeal- Nectar Straw -- Pharyngeal -- Pharyngeal- Thin Teaspoon -- Pharyngeal -- Pharyngeal- Thin Cup -- Pharyngeal -- Pharyngeal- Thin Straw Delayed swallow initiation-pyriform sinuses;Penetration/Aspiration before swallow Pharyngeal Material enters airway, remains ABOVE vocal cords then ejected out Pharyngeal- Puree WFL Pharyngeal -- Pharyngeal- Mechanical Soft -- Pharyngeal -- Pharyngeal- Regular WFL Pharyngeal -- Pharyngeal- Multi-consistency -- Pharyngeal -- Pharyngeal- Pill WFL Pharyngeal -- Pharyngeal Comment --  No flowsheet data found. Amelia H. Roddie Mc, CCC-SLP Speech Language Pathologist Wende Bushy 11/19/2017, 11:31 AM                Medications:  Scheduled: .  stroke: mapping our early stages of recovery book   Does not apply Once  . sodium chloride   Intravenous Once  . Chlorhexidine Gluconate Cloth  6 each Topical Q0600  . darbepoetin (ARANESP) injection - DIALYSIS  60 mcg  Intravenous Q Tue-HD  . dextrose      . feeding supplement (NEPRO CARB STEADY)  237 mL Oral BID BM  . heparin injection (subcutaneous)  5,000 Units Subcutaneous Q8H  . insulin aspart  0-9 Units Subcutaneous TID WC  . phenytoin  125 mg Oral BID  . saccharomyces boulardii  250 mg Oral BID  . sodium chloride flush  10-40 mL Intracatheter Q12H  . topiramate  25 mg Oral BID   Continuous: . sodium chloride Stopped (11/11/17 1911)  . sodium chloride 100 mL/hr at 11/19/17 0306  . cefTRIAXone (ROCEPHIN)  IV 2 g (11/18/17 2121)  . levETIRAcetam 1,000 mg (11/19/17 0508)  . levETIRAcetam     KKX:FGHWEX chloride, acetaminophen **OR** acetaminophen, levETIRAcetam, loperamide, ondansetron **OR** ondansetron (ZOFRAN) IV, RESOURCE THICKENUP CLEAR, senna-docusate, sodium chloride flush  Assessment/Plan:    Acute on chronic renal failure--stage unspecified presently -etiology likely infection and hemodynamic changes/ATN -Urology is following.  Patient is being dialyzed based on renal function and urine output.  Foley catheter was placed this morning.  Further management per nephrology. -Non-tunneled HD catheter placed by interventional radiology.    Pyelonephritis -11/08/17 urine culture = Ecoli -Patient was started on ceftriaxone on 9/23. -11/08/2017 CT renal protocol--bilateral ureteral stents without hydronephrosis. Small bilateral renal calculi. Small amount of ascites with anasarca. Thickened stomach may be related to ascites. -ureteral stents initially placed 10/07/17 -Discussed with Dr. Alyson Ingles with urology.  No need for removal of stents during this hospital stay.  This can be pursued in the office.  Okay to place Foley catheter.  Seizure disorder/Left Hemiparesis -Seizures appear to be new onset after dialysis.  Patient was started on antiepileptics.  Noted to be on Dilantin and Keppra and Topamax.  No further twitching episodes noted.  Neurology has been following.  MRA head did not  show any significant stenosis.  Noted to be on Topamax, phenytoin and Keppra.  Change to oral Keppra.  Acute CVA  -MRI significant for watershed infarction at both frontal and parietal varices.  Patient with subtle left-sided weakness.  Stable.  PT and OT evaluation.  Continue to monitor.   Diabetes mellitus type 2, uncontrolled with hyperglycemia Patient has been on Lantus insulin.  Noted to have an episode of hypoglycemia this morning.  Her CBGs have been somewhat borderline low recently. We will discontinue the Lantus.  Continue SSI.  HbA1c was 7.0 on 10/3.  Essential hypertension Patient was taken off of amlodipine and metoprolol due to soft blood pressures initially.  Blood pressure is reasonably well controlled. Would not be too aggressive with treatment as patient is getting dialysis.  Echocardiogram showed normal systolic function.  Asymmetric septal hypertrophy was noted.  Will need outpatient follow-up with cardiology eventually.  Metabolic acidosis Secondary to acute on chronic renal failure. Patient was on bicarbonate infusion.  Not anymore.  Bicarbonate levels have improved.  Diarrhea Negative C. difficile.  Imodium as needed.  Severe protein calorie malnutrition Continue nutritional supplements.  Normocytic Anemia -Hemoglobin  was around 9 during recent hospital stay in [place], and was around 9 on admission, slowly drifting down.  Patient was transfused 1 unit of PRBC on 10/1.  Hemoglobin is 8.6 today.  No evidence for acute blood loss. -Anemia of chronic blood loss, and chronic illness and kidney disease, receiving Procrit with dialysis, at this point no IV iron in the setting of her severe infection from pyelonephritis. -Hemoccult positive, this is most likely in the setting diarrhea, as she has Flexi-Seal with green-colored stool in the bag, no significant evidence of GI bleed, continue Protonix 40 twice daily.  No GI bleed noted.  Outpatient work-up.  DVT  Prophylaxis: Subcutaneous heparin    Code Status: Full code Family Communication: No family at bedside Disposition Plan: Management as outlined above.  Rehab is recommended by physical and Occupational Therapy.  Not medically ready for discharge.    LOS: 11 days   [place] Pager [phone] 11/19/2017, 1:51 PM  If 7PM-7AM, please contact night-coverage at [email or web address], password [facility]

## 2017-11-19 NOTE — Care Management Note (Signed)
Case Management Note  Patient Details  Name: Erica [name] MRN: [id number] Date of Birth: [birth year]  Subjective/Objective:                    Action/Plan: Recommendations are for CIR/SNF. CM met with the patient and she is agreeable to being faxed out in the [facility] area for SNF. FL2 completed. CSW updated.  CM following for d/c disposition.  Expected Discharge Date:  11/12/17               Expected Discharge Plan:  IP Rehab Facility  In-House Referral:  Clinical Social Work  Discharge planning Services  CM Consult  Post Acute Care Choice:    Choice offered to:     DME Arranged:    DME Agency:     HH Arranged:    HH Agency:     Status of Service:  In process, will continue to follow  If discussed at Long Length of Stay Meetings, dates discussed:    Additional Comments:  Pollie Friar, RN 11/19/2017, 3:42 PM

## 2017-11-19 NOTE — Progress Notes (Signed)
Patient FSBG was 55, alert and oriented, 50%dextrose given, some orange juice and snack offered to her. Will reassess her sugar in 40minute

## 2017-11-20 LAB — GLUCOSE, CAPILLARY
GLUCOSE-CAPILLARY: 135 mg/dL — AB (ref 70–99)
GLUCOSE-CAPILLARY: 123 mg/dL — AB (ref 70–99)
Glucose-Capillary: 126 mg/dL — ABNORMAL HIGH (ref 70–99)
Glucose-Capillary: 228 mg/dL — ABNORMAL HIGH (ref 70–99)

## 2017-11-20 LAB — CREATININE CLEARANCE, URINE, 24 HOUR
CREATININE 24H UR: 128 mg/d — AB (ref 600–1800)
Collection Interval-CRCL: 24 hours
Creatinine Clearance: 3 mL/min — ABNORMAL LOW (ref 75–115)
Creatinine, Urine: 18.33 mg/dL
Urine Total Volume-CRCL: 700 mL

## 2017-11-20 LAB — BASIC METABOLIC PANEL
ANION GAP: 7 (ref 5–15)
BUN: 8 mg/dL (ref 8–23)
CHLORIDE: 113 mmol/L — AB (ref 98–111)
CO2: 20 mmol/L — ABNORMAL LOW (ref 22–32)
Calcium: 7.8 mg/dL — ABNORMAL LOW (ref 8.9–10.3)
Creatinine, Ser: 3.27 mg/dL — ABNORMAL HIGH (ref 0.44–1.00)
GFR calc Af Amer: 16 mL/min — ABNORMAL LOW (ref >60)
GFR, EST NON AFRICAN AMERICAN: 14 mL/min — AB (ref >60)
GLUCOSE: 167 mg/dL — AB (ref 70–99)
POTASSIUM: 4.5 mmol/L (ref 3.5–5.1)
Sodium: 140 mmol/L (ref 135–145)

## 2017-11-20 LAB — CBC
HCT: 28 % — ABNORMAL LOW (ref 36.0–46.0)
Hemoglobin: 8.2 g/dL — ABNORMAL LOW (ref 12.0–15.0)
MCH: 27.2 pg (ref 26.0–34.0)
MCHC: 29.3 g/dL — ABNORMAL LOW (ref 30.0–36.0)
MCV: 93 fL (ref 78.0–100.0)
Platelets: 160 10*3/uL (ref 150–400)
RBC: 3.01 MIL/uL — ABNORMAL LOW (ref 3.87–5.11)
RDW: 19.5 % — ABNORMAL HIGH (ref 11.5–15.5)
WBC: 10.5 10*3/uL (ref 4.0–10.5)

## 2017-11-20 NOTE — Progress Notes (Addendum)
[facility] PROGRESS NOTE  Erica [name] GEZ:[id number] DOB: 1955/03/01 DOA: 11/08/2017  PCP: Octavio Graves, DO  Brief History/Interval Summary: 62 year old African-American female with past medical history of diabetes mellitus type 2, essential hypertension, history of seizure disorder presented with 2 to 3 days of confusion. The patient was also complaining of lower abd pain with dysuria.The patient was recently hospitalized at [facility] from 10/07/2017 through 10/21/2017 during which she was treated for emphysematous pyelonephritis and acute on chronic renal failure. She also was treated for DKA and fluid overload.She was noted to have hemoglobin A1c of 12.1. She required transfusion 2 u PRBC without evidence of active bleeding.She underwent bilateral ureteral stenting on 10/07/2017. She was treated appropriately with antibiotics. She was discharged with instructions for outpatient cystoscopy and removal of stents in 2 to 3 weeks. At the time of discharge, her serum creatinine has stabilized between 3.7-3.9.Upon thisadmission, the patient has serum creatinine of 8.1. Nephrology was consulted to assist with management. Urology was also consulted due to retained stents--they are currently planning to remove stents. Patient had seizures 11/14/2017, she was transferred to [facility] for further work-up , her MRI brain significant for scattered infarcts, as well her seizures has been managed by neurology, hemodialysis has been continued during this hospital stay.   Consultants: Nephrology.  Urology.  Neurology.  Procedures:   Transthoracic echocardiogram Study Conclusions  - Left ventricle: The cavity size was normal. Moderate focal basal   septal hypertrophy. Mild hypertrophy of the rest of the   ventricle. No LV outflow tract gradient noted. Systolic function   was normal. The estimated ejection fraction was in the range of   60% to 65%. Wall motion was  normal; there were no regional wall   motion abnormalities. Doppler parameters are consistent with   abnormal left ventricular relaxation (grade 1 diastolic   dysfunction). - Aortic valve: There was no stenosis. There was trivial   regurgitation. - Mitral valve: There was chordal systolic anterior motion but not   valvular SAM. Moderately calcified annulus. There was trivial   regurgitation. - Right ventricle: The cavity size was normal. Systolic function   was normal. - Right atrium: Catheter noted in RA. - Tricuspid valve: Peak RV-RA gradient (S): 47 mm Hg. - Pulmonary arteries: PA peak pressure: 50 mm Hg (S). - Inferior vena cava: The vessel was normal in size. The   respirophasic diameter changes were in the normal range (>= 50%),   consistent with normal central venous pressure.  Impressions:  - Normal LV size with moderate asymmetric basal septal hypertrophy,   mild hypertrophy of the rest of the ventricle. No LVOT gradient.   There was chordal but not mitral valve SAM. Normal RV size and   systolic function. No significant valvular abnormalities.   Differential is LVH due to HTN versus a variant of hypertrophic   cardiomyopathy.  Antibiotics: Ceftriaxone 9/23  Subjective/Interval History: Patient denies any complaints.  No chest pain or shortness of breath.  ROS: No nausea or vomiting  Objective:  Vital Signs  Vitals:   11/20/17 0347 11/20/17 0429 11/20/17 0824 11/20/17 1226  BP:  132/70 131/70 123/67  Pulse:  100 95 (!) 103  Resp:  17 15 20   Temp: 99 F (37.2 C) (!) 96 F (35.6 C) 99.7 F (37.6 C) 98.6 F (37 C)  TempSrc: Oral Oral Oral Oral  SpO2:  100% 97% 99%  Weight:      Height:  Intake/Output Summary (Last 24 hours) at 11/20/2017 1351 Last data filed at 11/20/2017 1100 Gross per 24 hour  Intake 360 ml  Output 420 ml  Net -60 ml   Filed Weights   11/13/17 2230 11/16/17 1505 11/16/17 1805  Weight: 50 kg 50.4 kg 50 kg    General  appearance: Awake alert.  In no distress. Resp: Clear to auscultation bilaterally. Cardio: S1-S2 is normal regular.  No S3-S4.  No rubs murmurs or bruit GI: Abdomen remains soft.  Nontender nondistended Extremities: No pedal edema Neurologic: No changes in neurological exam.  Subtle left-sided weakness.  Lab Results:  Data Reviewed: I have personally reviewed following labs and imaging studies  CBC: Recent Labs  Lab 11/14/17 1615 11/15/17 0807 11/16/17 0514 11/18/17 0434 11/20/17 0420  WBC 10.7* 10.3 14.9* 13.5* 10.5  HGB 7.0* 7.7* 7.2* 8.6* 8.2*  HCT 22.9* 25.9* 23.7* 28.7* 28.0*  MCV 89.8 91.8 92.2 89.1 93.0  PLT 258 242 259 188 701    Basic Metabolic Panel: Recent Labs  Lab 11/14/17 0656  11/14/17 1615 11/15/17 0807 11/15/17 0924 11/16/17 0514 11/17/17 2245 11/18/17 0434 11/20/17 0420  NA 138   < > 135 139 137 138 140 141 140  K 3.6   < > 3.6 3.7 3.6 3.6 3.4* 3.2* 4.5  CL 97*   < > 99 103 101 105 107 109 113*  CO2 27   < > 23 24 22  19* 23 26 20*  GLUCOSE 80   < > 125* 93 88 202* 107* 74 167*  BUN 23   < > 22 23 23  24* 14 14 8   CREATININE 4.57*   < > 4.81* 5.08* 5.06* 5.48* 3.71* 3.93* 3.27*  CALCIUM 7.1*   < > 7.0* 7.6* 7.4* 7.2* 7.6* 7.5* 7.8*  PHOS 3.6  --  4.4 4.4  --  4.2  --   --   --    < > = values in this interval not displayed.    GFR: Estimated Creatinine Clearance: 12.6 mL/min (A) (by C-G formula based on SCr of 3.27 mg/dL (H)).  Liver Function Tests: Recent Labs  Lab 11/14/17 0656 11/14/17 1615 11/15/17 0807 11/15/17 0924 11/16/17 0514  AST  --   --   --  24  --   ALT  --   --   --  12  --   ALKPHOS  --   --   --  69  --   BILITOT  --   --   --  0.9  --   PROT  --   --   --  5.8*  --   ALBUMIN 2.0* 1.8* 1.9* 1.9* 1.8*    Coagulation Profile: Recent Labs  Lab 11/14/17 1045  INR 1.47    Cardiac Enzymes: Recent Labs  Lab 11/14/17 1045  TROPONINI <0.03    CBG: Recent Labs  Lab 11/19/17 1127 11/19/17 1711 11/19/17 2134  11/20/17 0654 11/20/17 1058  GLUCAP 85 139* 98 135* 126*     Recent Results (from the past 240 hour(s))  C difficile quick scan w PCR reflex     Status: None   Collection Time: 11/13/17 11:17 PM  Result Value Ref Range Status   C Diff antigen NEGATIVE NEGATIVE Final   C Diff toxin NEGATIVE NEGATIVE Final   C Diff interpretation No C. difficile detected.  Final    Comment: VALID Performed at [facility], [address], [place], [place] [address]   MRSA PCR Screening  Status: None   Collection Time: 11/14/17  5:26 AM  Result Value Ref Range Status   MRSA by PCR NEGATIVE NEGATIVE Final    Comment:        The GeneXpert MRSA Assay (FDA approved for NASAL specimens only), is one component of a comprehensive MRSA colonization surveillance program. It is not intended to diagnose MRSA infection nor to guide or monitor treatment for MRSA infections. Performed at [facility], [place] [address], [place], [place] [address]   Culture, blood (single)     Status: None (Preliminary result)   Collection Time: 11/16/17  5:10 PM  Result Value Ref Range Status   Specimen Description BLOOD CENTRAL LINE  Final   Special Requests   Final    BOTTLES DRAWN AEROBIC AND ANAEROBIC Blood Culture results may not be optimal due to an excessive volume of blood received in culture bottles   Culture   Final    NO GROWTH 3 DAYS Performed at [facility], [place] [address] [place], [place] [address]    Report Status PENDING  Incomplete      Radiology Studies: Dg Swallowing Func-speech Pathology  Result Date: 11/19/2017 Objective Swallowing Evaluation: Type of Study: MBS-Modified Barium Swallow Study  Patient Details Name: BERNIECE ABID MRN: [id number] Date of Birth: 1956-01-25 Today's Date: 11/19/2017 Time: SLP Start Time (ACUTE ONLY): 5638 -SLP Stop Time (ACUTE ONLY): 0941 SLP Time Calculation (min) (ACUTE ONLY): 19 min Past Medical History: Past Medical History: Diagnosis Date .  Alcohol-induced pancreatitis  . Chronic diarrhea  . Depression  . Diabetes mellitus   fasting blood sugar 110-120s . Diastolic CHF ([place])  . DKA (diabetic ketoacidoses) ([place])  . Gastroparesis  . GERD (gastroesophageal reflux disease)  . Heart murmur  . History of kidney stones  . Hyperlipidemia  . Hypertension  . Hypokalemia  . Muscle spasm  . Neuropathic pain  . Neuropathy   Hx: of . Pyelonephritis  . Recurrent pancreatitis  . Seizures ([place])  . Vitamin B12 deficiency  . Vitamin D deficiency  Past Surgical History: Past Surgical History: Procedure Laterality Date . CATARACT EXTRACTION W/PHACO Right 03/14/2015  Procedure: CATARACT EXTRACTION PHACO AND INTRAOCULAR LENS PLACEMENT (IOC);  Surgeon: Baruch Goldmann, MD;  Location: [facility];  Service: Ophthalmology;  Laterality: Right;  CDE:11.13 . CATARACT EXTRACTION W/PHACO Left 04/11/2015  Procedure: CATARACT EXTRACTION PHACO AND INTRAOCULAR LENS PLACEMENT LEFT EYE CDE=9.68;  Surgeon: Baruch Goldmann, MD;  Location: [facility];  Service: Ophthalmology;  Laterality: Left; . COLONOSCOPY  02/24/2010 . cystoscopy with ureteral stent  Bilateral 10/07/2017  At [facility] [place] CV LINE RIGHT  11/17/2017 . IR US GUIDE VASC ACCESS RIGHT  11/17/2017 . MULTIPLE EXTRACTIONS WITH ALVEOLOPLASTY N/A 06/13/2012  Procedure: MULTIPLE EXTRACION WITH ALVEOLOPLASTY EXTRACT: 18, 19, 20, 21, 22, 24, 25, 27, 28, 29, 30, 31;  Surgeon: Gae Bon, DDS;  Location: [facility];  Service: Oral Surgery;  Laterality: N/A; . TUBAL LIGATION   . URETERAL STENT PLACEMENT  09/2017 HPI: 62 year old female admitted 11/08/17 with weakness. Transferred to [facility] 11/15/27 due to seizure activity. PMH: DM2, HTN, seizure disorder, alcohol induced pancreatitis, GERD, HLD. MRI = Scattered punctate foci of acute infarction, c/w watershed infarction. MBS recommended to objectively assess the swallowing function and determine LRD secondary to overt coughing on thin liquids and coughing with D2 textures following thin  liquids during MBS 11/18/2017.  Subjective: Pt resting in bed. No family present Assessment / Plan / Recommendation CHL  IP CLINICAL IMPRESSIONS 11/19/2017 Clinical Impression Pt presents with oropharyngeal function largely within functional limits with consistent and adequate glottic competence and airway protection noted throughout study. Pt is edentulous and presents with baseline cognitive deficits likely causing noted mildly prolonged bolus manipulation and transit time across consistencies and some lingual pumping. Mildly delayed swallow trigger to the level of the valleculae with solid textures and occasional premature spillage to the pyriforms with mixed consistencies and thin liquids when pressed to take large tablespoon bites of mixed consistencies or consecutive straw sips of thin liquids. Flash penetration of thin liquids reflexively ejected by the end of the swallow on all instances. Pt did not cough during the exam in contrast to presentation at bedside yesterday with the exception of one brief coughing episode with initial tsp trial of mixed consistency that was not visualized; no difficulty was noted with larger additional bites. Secondary to edentulous status recommend continue with dysphagia 2 diet/fine chop and upgrade to thin liquids. ST will follow up X1 for clinical diet check and to review aspiration precautions re: utlize slow rate, small sips and alternate bites and sips prior to signing off. SLP Visit Diagnosis Dysphagia, unspecified (R13.10) Attention and concentration deficit following -- Frontal lobe and executive function deficit following -- Impact on safety and function Mild aspiration risk   CHL IP TREATMENT RECOMMENDATION 11/19/2017 Treatment Recommendations Therapy as outlined in treatment plan below   Prognosis 11/19/2017 Prognosis for Safe Diet Advancement Fair Barriers to Reach Goals Cognitive deficits Barriers/Prognosis Comment -- CHL IP DIET RECOMMENDATION 11/19/2017 SLP Diet  Recommendations Dysphagia 2 (Fine chop) solids;Thin liquid Liquid Administration via Straw;Cup Medication Administration Whole meds with liquid Compensations Minimize environmental distractions;Small sips/bites;Slow rate;Follow solids with liquid Postural Changes Remain semi-upright after after feeds/meals (Comment);Seated upright at 90 degrees   CHL IP OTHER RECOMMENDATIONS 11/19/2017 Recommended Consults -- Oral Care Recommendations Oral care BID Other Recommendations --   CHL IP FOLLOW UP RECOMMENDATIONS 11/19/2017 Follow up Recommendations None   CHL IP FREQUENCY AND DURATION 11/19/2017 Speech Therapy Frequency (ACUTE ONLY) min 1 x/week Treatment Duration 2 weeks      CHL IP ORAL PHASE 11/19/2017 Oral Phase Impaired Oral - Pudding Teaspoon -- Oral - Pudding Cup -- Oral - Honey Teaspoon -- Oral - Honey Cup -- Oral - Nectar Teaspoon -- Oral - Nectar Cup -- Oral - Nectar Straw -- Oral - Thin Teaspoon WFL Oral - Thin Cup WFL Oral - Thin Straw Lingual pumping;Premature spillage Oral - Puree Lingual pumping;Reduced posterior propulsion Oral - Mech Soft Lingual pumping;Reduced posterior propulsion Oral - Regular -- Oral - Multi-Consistency -- Oral - Pill WFL Oral Phase - Comment --  CHL IP PHARYNGEAL PHASE 11/19/2017 Pharyngeal Phase -- Pharyngeal- Pudding Teaspoon -- Pharyngeal -- Pharyngeal- Pudding Cup -- Pharyngeal -- Pharyngeal- Honey Teaspoon -- Pharyngeal -- Pharyngeal- Honey Cup -- Pharyngeal -- Pharyngeal- Nectar Teaspoon -- Pharyngeal -- Pharyngeal- Nectar Cup -- Pharyngeal -- Pharyngeal- Nectar Straw -- Pharyngeal -- Pharyngeal- Thin Teaspoon -- Pharyngeal -- Pharyngeal- Thin Cup -- Pharyngeal -- Pharyngeal- Thin Straw Delayed swallow initiation-pyriform sinuses;Penetration/Aspiration before swallow Pharyngeal Material enters airway, remains ABOVE vocal cords then ejected out Pharyngeal- Puree WFL Pharyngeal -- Pharyngeal- Mechanical Soft -- Pharyngeal -- Pharyngeal- Regular WFL Pharyngeal -- Pharyngeal-  Multi-consistency -- Pharyngeal -- Pharyngeal- Pill WFL Pharyngeal -- Pharyngeal Comment --  No flowsheet data found. Amelia H. Roddie Mc, CCC-SLP Speech Language Pathologist Wende Bushy 11/19/2017, 11:31 AM  Medications:  Scheduled: .  stroke: mapping our early stages of recovery book   Does not apply Once  . sodium chloride   Intravenous Once  . Chlorhexidine Gluconate Cloth  6 each Topical Q0600  . darbepoetin (ARANESP) injection - DIALYSIS  60 mcg Intravenous Q Tue-HD  . feeding supplement (NEPRO CARB STEADY)  237 mL Oral BID BM  . heparin injection (subcutaneous)  5,000 Units Subcutaneous Q8H  . insulin aspart  0-9 Units Subcutaneous TID WC  . levETIRAcetam  1,000 mg Oral Daily  . phenytoin  125 mg Oral BID  . saccharomyces boulardii  250 mg Oral BID  . sodium chloride flush  10-40 mL Intracatheter Q12H  . topiramate  25 mg Oral BID   Continuous: . sodium chloride Stopped (11/11/17 1911)  . sodium chloride 100 mL/hr at 11/20/17 0934  . cefTRIAXone (ROCEPHIN)  IV 2 g (11/19/17 2120)  . levETIRAcetam     HYW:VPXTGG chloride, acetaminophen **OR** acetaminophen, levETIRAcetam, loperamide, ondansetron **OR** ondansetron (ZOFRAN) IV, RESOURCE THICKENUP CLEAR, senna-docusate, sodium chloride flush  Assessment/Plan:    Acute on chronic renal failure--stage unspecified presently -etiology likely infection and hemodynamic changes/ATN -Nephrology is following.  Patient was dialyzed.  Foley catheter was placed and 24-hour urine collection was started yesterday.  To be sent to the lab today.  Further management per nephrology.  Creatinine appears to have improved. -Non-tunneled HD catheter placed by interventional radiology.    Pyelonephritis -11/08/17 urine culture = Ecoli -Patient was started on ceftriaxone on 9/23. -11/08/2017 CT renal protocol--bilateral ureteral stents without hydronephrosis. Small bilateral renal calculi.  -ureteral stents initially  placed 10/07/17 -Discussed with Dr. Alyson Ingles with urology.  No need for removal of stents during this hospital stay.  This can be pursued in the office.  Okay to place Foley catheter.  Seizure disorder/Left Hemiparesis -Seizures appear to be new onset after dialysis.  Patient was started on antiepileptics.  Noted to be on Dilantin and Keppra and Topamax.  No further twitching episodes noted.  Neurology has been following.  MRA head did not show any significant stenosis.  Noted to be on Topamax, phenytoin and Keppra.  Neurology has signed off.  Acute CVA  -MRI significant for watershed infarction at both frontal and parietal varices.  Patient with subtle left-sided weakness.  Stable.  PT and OT evaluation.  CIR versus skilled nursing facility.  Diabetes mellitus type 2, uncontrolled with hyperglycemia Patient was on Lantus insulin.  She was noted to have episode of hypoglycemia on the morning of 10/4.  Her CBGs have been borderline low and so Lantus discontinued.  HbA1c was 7.0 on 10/3.  Continue to monitor.  Essential hypertension Patient was taken off of amlodipine and metoprolol due to soft blood pressures initially.  Blood pressure is reasonably well controlled. Echocardiogram showed normal systolic function.  Asymmetric septal hypertrophy was noted.  Will need outpatient follow-up with cardiology eventually.  Metabolic acidosis Secondary to acute on chronic renal failure. Patient was on bicarbonate infusion.  Not anymore.  Bicarbonate levels have improved.  Diarrhea Negative C. difficile.  Imodium as needed.  Severe protein calorie malnutrition Continue nutritional supplements.  Normocytic Anemia -Hemoglobin was around 9 during recent hospital stay in [place], and was around 9 on admission, slowly drifting down.  Patient was transfused 1 unit of PRBC on 10/1.  Hemoglobin is 8.6 today.  No evidence for acute blood loss. -Anemia of chronic blood loss, and chronic illness and  kidney disease, receiving Procrit with dialysis, at this point no  IV iron in the setting of her severe infection from pyelonephritis. -Hemoccult positive, this is most likely in the setting diarrhea, as she has Flexi-Seal with green-colored stool in the bag, no significant evidence of GI bleed, continue Protonix 40 twice daily.  No GI bleed noted.  Outpatient work-up.  DVT Prophylaxis: Subcutaneous heparin    Code Status: Full code Family Communication: No family at bedside Disposition Plan: Management as outlined above.  Inpatient rehab recommended by PT. Not medically ready for discharge.    LOS: 12 days   [facility] Pager [phone] 11/20/2017, 1:51 PM  If 7PM-7AM, please contact night-coverage at [email or web address], password [facility]

## 2017-11-20 NOTE — Progress Notes (Signed)
[facility] Progress Note   Subjective:    She has no complaints. 24h urine collection completed -- Uvol ~576mL.  RN sending to lab now  Objective Vitals:   11/20/17 0227 11/20/17 0347 11/20/17 0429 11/20/17 0824  BP: 134/70  132/70 131/70  Pulse: (!) 103  100 95  Resp:   17 15  Temp:  99 F (37.2 C) (!) 96 F (35.6 C) 99.7 F (37.6 C)  TempSrc:  Oral Oral Oral  SpO2: 98%  100% 97%  Weight:      Height:       Physical Exam General: lying flat comfortably Heart: RRR, no rub Lungs: normal WOB, clear anteriorly Abdomen: soft, nontender Extremities: trace LE edema GU: +foley draining yellow urine Dialysis Access:  RIJ temp cath c/d/i  Additional Objective Labs: Basic Metabolic Panel: Recent Labs  Lab 11/14/17 1615 11/15/17 0807  11/16/17 0514 11/17/17 2245 11/18/17 0434 11/20/17 0420  NA 135 139   < > 138 140 141 140  K 3.6 3.7   < > 3.6 3.4* 3.2* 4.5  CL 99 103   < > 105 107 109 113*  CO2 23 24   < > 19* 23 26 20*  GLUCOSE 125* 93   < > 202* 107* 74 167*  BUN 22 23   < > 24* 14 14 8   CREATININE 4.81* 5.08*   < > 5.48* 3.71* 3.93* 3.27*  CALCIUM 7.0* 7.6*   < > 7.2* 7.6* 7.5* 7.8*  PHOS 4.4 4.4  --  4.2  --   --   --    < > = values in this interval not displayed.   Liver Function Tests: Recent Labs  Lab 11/15/17 0807 11/15/17 0924 11/16/17 0514  AST  --  24  --   ALT  --  12  --   ALKPHOS  --  69  --   BILITOT  --  0.9  --   PROT  --  5.8*  --   ALBUMIN 1.9* 1.9* 1.8*   No results for input(s): LIPASE, AMYLASE in the last 168 hours. CBC: Recent Labs  Lab 11/14/17 1615 11/15/17 0807 11/16/17 0514 11/18/17 0434 11/20/17 0420  WBC 10.7* 10.3 14.9* 13.5* 10.5  HGB 7.0* 7.7* 7.2* 8.6* 8.2*  HCT 22.9* 25.9* 23.7* 28.7* 28.0*  MCV 89.8 91.8 92.2 89.1 93.0  PLT 258 242 259 188 160   Blood Culture    Component Value Date/Time   SDES BLOOD CENTRAL LINE 11/16/2017 1710   SPECREQUEST  11/16/2017 1710    BOTTLES DRAWN AEROBIC AND  ANAEROBIC Blood Culture results may not be optimal due to an excessive volume of blood received in culture bottles   CULT  11/16/2017 1710    NO GROWTH 3 DAYS Performed at [facility], [place] [address], [place], [place] [address]    REPTSTATUS PENDING 11/16/2017 1710    Cardiac Enzymes: Recent Labs  Lab 11/14/17 1045  TROPONINI <0.03   CBG: Recent Labs  Lab 11/19/17 1127 11/19/17 1711 11/19/17 2134 11/20/17 0654 11/20/17 1058  GLUCAP 85 139* 98 135* 126*   Iron Studies:  No results for input(s): IRON, TIBC, TRANSFERRIN, FERRITIN in the last 72 hours. @lablastinr3 @ Studies/Results: Dg Swallowing Func-speech Pathology  Result Date: 11/19/2017 Objective Swallowing Evaluation: Type of Study: MBS-Modified Barium Swallow Study  Patient Details Name: Erica [name] MRN: [id number] Date of Birth: Aug 17, 1955 Today's Date: 11/19/2017 Time: SLP Start Time (ACUTE ONLY): 7902 -SLP Stop Time (ACUTE ONLY):  0941 SLP Time Calculation (min) (ACUTE ONLY): 19 min Past Medical History: Past Medical History: Diagnosis Date . Alcohol-induced pancreatitis  . Chronic diarrhea  . Depression  . Diabetes mellitus   fasting blood sugar 110-120s . Diastolic CHF ([place])  . DKA (diabetic ketoacidoses) ([place])  . Gastroparesis  . GERD (gastroesophageal reflux disease)  . Heart murmur  . History of kidney stones  . Hyperlipidemia  . Hypertension  . Hypokalemia  . Muscle spasm  . Neuropathic pain  . Neuropathy   Hx: of . Pyelonephritis  . Recurrent pancreatitis  . Seizures ([place])  . Vitamin B12 deficiency  . Vitamin D deficiency  Past Surgical History: Past Surgical History: Procedure Laterality Date . CATARACT EXTRACTION W/PHACO Right 03/14/2015  Procedure: CATARACT EXTRACTION PHACO AND INTRAOCULAR LENS PLACEMENT (IOC);  Surgeon: Baruch Goldmann, MD;  Location: [facility];  Service: Ophthalmology;  Laterality: Right;  CDE:11.13 . CATARACT EXTRACTION W/PHACO Left 04/11/2015  Procedure: CATARACT EXTRACTION PHACO AND INTRAOCULAR  LENS PLACEMENT LEFT EYE CDE=9.68;  Surgeon: Baruch Goldmann, MD;  Location: [facility];  Service: Ophthalmology;  Laterality: Left; . COLONOSCOPY  02/24/2010 . cystoscopy with ureteral stent  Bilateral 10/07/2017  At [place] CV LINE RIGHT  11/17/2017 . IR US GUIDE VASC ACCESS RIGHT  11/17/2017 . MULTIPLE EXTRACTIONS WITH ALVEOLOPLASTY N/A 06/13/2012  Procedure: MULTIPLE EXTRACION WITH ALVEOLOPLASTY EXTRACT: 18, 19, 20, 21, 22, 24, 25, 27, 28, 29, 30, 31;  Surgeon: Gae Bon, DDS;  Location: [facility];  Service: Oral Surgery;  Laterality: N/A; . TUBAL LIGATION   . URETERAL STENT PLACEMENT  09/2017 HPI: 62 year old female admitted 11/08/17 with weakness. Transferred to [facility] 11[facility]15/27 due to seizure activity. PMH: DM2, HTN, seizure disorder, alcohol induced pancreatitis, GERD, HLD. MRI = Scattered punctate foci of acute infarction, c/w watershed infarction. MBS recommended to objectively assess the swallowing function and determine LRD secondary to overt coughing on thin liquids and coughing with D2 textures following thin liquids during MBS 11/18/2017.  Subjective: Pt resting in bed. No family present Assessment / Plan / Recommendation CHL IP CLINICAL IMPRESSIONS 11/19/2017 Clinical Impression Pt presents with oropharyngeal function largely within functional limits with consistent and adequate glottic competence and airway protection noted throughout study. Pt is edentulous and presents with baseline cognitive deficits likely causing noted mildly prolonged bolus manipulation and transit time across consistencies and some lingual pumping. Mildly delayed swallow trigger to the level of the valleculae with solid textures and occasional premature spillage to the pyriforms with mixed consistencies and thin liquids when pressed to take large tablespoon bites of mixed consistencies or consecutive straw sips of thin liquids. Flash penetration of thin liquids reflexively ejected by the end of the swallow on all  instances. Pt did not cough during the exam in contrast to presentation at bedside yesterday with the exception of one brief coughing episode with initial tsp trial of mixed consistency that was not visualized; no difficulty was noted with larger additional bites. Secondary to edentulous status recommend continue with dysphagia 2 diet/fine chop and upgrade to thin liquids. ST will follow up X1 for clinical diet check and to review aspiration precautions re: utlize slow rate, small sips and alternate bites and sips prior to signing off. SLP Visit Diagnosis Dysphagia, unspecified (R13.10) Attention and concentration deficit following -- Frontal lobe and executive function deficit following -- Impact on safety and function Mild aspiration risk   CHL IP TREATMENT RECOMMENDATION 11/19/2017 Treatment Recommendations Therapy as outlined in treatment plan below  Prognosis 11/19/2017 Prognosis for Safe Diet Advancement Fair Barriers to Reach Goals Cognitive deficits Barriers/Prognosis Comment -- CHL IP DIET RECOMMENDATION 11/19/2017 SLP Diet Recommendations Dysphagia 2 (Fine chop) solids;Thin liquid Liquid Administration via Straw;Cup Medication Administration Whole meds with liquid Compensations Minimize environmental distractions;Small sips/bites;Slow rate;Follow solids with liquid Postural Changes Remain semi-upright after after feeds/meals (Comment);Seated upright at 90 degrees   CHL IP OTHER RECOMMENDATIONS 11/19/2017 Recommended Consults -- Oral Care Recommendations Oral care BID Other Recommendations --   CHL IP FOLLOW UP RECOMMENDATIONS 11/19/2017 Follow up Recommendations None   CHL IP FREQUENCY AND DURATION 11/19/2017 Speech Therapy Frequency (ACUTE ONLY) min 1 x/week Treatment Duration 2 weeks      CHL IP ORAL PHASE 11/19/2017 Oral Phase Impaired Oral - Pudding Teaspoon -- Oral - Pudding Cup -- Oral - Honey Teaspoon -- Oral - Honey Cup -- Oral - Nectar Teaspoon -- Oral - Nectar Cup -- Oral - Nectar Straw -- Oral - Thin  Teaspoon WFL Oral - Thin Cup WFL Oral - Thin Straw Lingual pumping;Premature spillage Oral - Puree Lingual pumping;Reduced posterior propulsion Oral - Mech Soft Lingual pumping;Reduced posterior propulsion Oral - Regular -- Oral - Multi-Consistency -- Oral - Pill WFL Oral Phase - Comment --  CHL IP PHARYNGEAL PHASE 11/19/2017 Pharyngeal Phase -- Pharyngeal- Pudding Teaspoon -- Pharyngeal -- Pharyngeal- Pudding Cup -- Pharyngeal -- Pharyngeal- Honey Teaspoon -- Pharyngeal -- Pharyngeal- Honey Cup -- Pharyngeal -- Pharyngeal- Nectar Teaspoon -- Pharyngeal -- Pharyngeal- Nectar Cup -- Pharyngeal -- Pharyngeal- Nectar Straw -- Pharyngeal -- Pharyngeal- Thin Teaspoon -- Pharyngeal -- Pharyngeal- Thin Cup -- Pharyngeal -- Pharyngeal- Thin Straw Delayed swallow initiation-pyriform sinuses;Penetration/Aspiration before swallow Pharyngeal Material enters airway, remains ABOVE vocal cords then ejected out Pharyngeal- Puree WFL Pharyngeal -- Pharyngeal- Mechanical Soft -- Pharyngeal -- Pharyngeal- Regular WFL Pharyngeal -- Pharyngeal- Multi-consistency -- Pharyngeal -- Pharyngeal- Pill WFL Pharyngeal -- Pharyngeal Comment --  No flowsheet data found. Amelia H. Roddie Mc, CCC-SLP Speech Language Pathologist Wende Bushy 11/19/2017, 11:31 AM              Medications: . sodium chloride Stopped (11/11/17 1911)  . sodium chloride 100 mL/hr at 11/20/17 0934  . cefTRIAXone (ROCEPHIN)  IV 2 g (11/19/17 2120)  . levETIRAcetam     .  stroke: mapping our early stages of recovery book   Does not apply Once  . sodium chloride   Intravenous Once  . Chlorhexidine Gluconate Cloth  6 each Topical Q0600  . darbepoetin (ARANESP) injection - DIALYSIS  60 mcg Intravenous Q Tue-HD  . feeding supplement (NEPRO CARB STEADY)  237 mL Oral BID BM  . heparin injection (subcutaneous)  5,000 Units Subcutaneous Q8H  . insulin aspart  0-9 Units Subcutaneous TID WC  . levETIRAcetam  1,000 mg Oral Daily  . phenytoin  125 mg Oral BID  .  saccharomyces boulardii  250 mg Oral BID  . sodium chloride flush  10-40 mL Intracatheter Q12H  . topiramate  25 mg Oral BID     Assessment/Plan:  1.  AKI on CKD:  Recent course with emphysematous pyelonephritis treated at [facility], urologic intervention double J stents placed.  During that admission Cr ~ 8 on presentation, down to 4 on discharge.  Represented 2 weeks later with Cr up to 11.  She was initially hydrated but due lack of improved had HD x 2 at [facility] prior to transfer here on 9/29. She has required ongoing HD here, another tx 10/3.   24hr urine for creatinine  clearance going to lab now.   If she continues to need dialysis she will need her line tunneled.    Given fever 10/1we removed her femoral HD catheter following HD.  Blood culture is negative to date.   2.   HTN:   Reasonable control for now.  BP has ranged 130-140 in past 24h. She is on no antiHTN here.   3.  Anemia:  Hb has trended down significantly from 10.1 on 9/23 to 7.2 and she rec'd transfusion 10/1.  Stable today at 8.2.  Iron indices 9/30 show iron replete.  Hemoccult + and started on PPI but no signs of gross bleeding.  Given aranesp 69 with HD 10/1.    4.  BMM: phos 4.2, corr ca normal (albumin 1.8).   5. Seizures:  H/o hypoglycemia induced seizures; neurology following here and dosing meds for dialysis at this time.  She appears to have improved.    6.  E coli UTI: Rocephin 2g daily since 9/26.  Has ureteral stents in place from prior hospitalization.  Urology supposed to remove while she is here.    7.  DM: per primary.   8.  Hypokalemia: resolved  Jannifer Hick MD 11/20/2017, 11:49 AM  [facility]

## 2017-11-20 NOTE — Plan of Care (Signed)

## 2017-11-21 LAB — COMPREHENSIVE METABOLIC PANEL
ALBUMIN: 1.8 g/dL — AB (ref 3.5–5.0)
ALK PHOS: 93 U/L (ref 38–126)
ALT: 12 U/L (ref 0–44)
ANION GAP: 7 (ref 5–15)
AST: 29 U/L (ref 15–41)
BILIRUBIN TOTAL: 0.2 mg/dL — AB (ref 0.3–1.2)
BUN: 14 mg/dL (ref 8–23)
CALCIUM: 7.8 mg/dL — AB (ref 8.9–10.3)
CO2: 17 mmol/L — ABNORMAL LOW (ref 22–32)
CREATININE: 3.95 mg/dL — AB (ref 0.44–1.00)
Chloride: 112 mmol/L — ABNORMAL HIGH (ref 98–111)
GFR calc Af Amer: 13 mL/min — ABNORMAL LOW (ref >60)
GFR calc non Af Amer: 11 mL/min — ABNORMAL LOW (ref >60)
GLUCOSE: 163 mg/dL — AB (ref 70–99)
Potassium: 4.7 mmol/L (ref 3.5–5.1)
SODIUM: 136 mmol/L (ref 135–145)
TOTAL PROTEIN: 5.7 g/dL — AB (ref 6.5–8.1)

## 2017-11-21 LAB — CULTURE, BLOOD (SINGLE): CULTURE: NO GROWTH

## 2017-11-21 LAB — GLUCOSE, CAPILLARY
Glucose-Capillary: 164 mg/dL — ABNORMAL HIGH (ref 70–99)
Glucose-Capillary: 130 mg/dL — ABNORMAL HIGH (ref 70–99)
Glucose-Capillary: 134 mg/dL — ABNORMAL HIGH (ref 70–99)
Glucose-Capillary: 139 mg/dL — ABNORMAL HIGH (ref 70–99)

## 2017-11-21 LAB — PHENYTOIN LEVEL, TOTAL: Phenytoin Lvl: 10.2 ug/mL (ref 10.0–20.0)

## 2017-11-21 MED ORDER — HEPARIN SODIUM (PORCINE) 5000 UNIT/ML IJ SOLN
5000.0000 [IU] | Freq: Three times a day (TID) | INTRAMUSCULAR | Status: DC
  Administered 2017-11-22 – 2017-11-29 (×19): 5000 [IU] via SUBCUTANEOUS
  Filled 2017-11-21 (×19): qty 1

## 2017-11-21 NOTE — Progress Notes (Addendum)
[place] for phenytoin dosing Indication: Patient safety/ therapeutic monitoring  Allergies  Allergen Reactions  . Aspirin Palpitations    Patient Measurements: Height: 5' (152.4 cm) Weight: 110 lb 3.7 oz (50 kg) IBW/kg (Calculated) : 45.5   Vital Signs: Temp: 99.2 F (37.3 C) (10/06 1234) Temp Source: Oral (10/06 1234) BP: 136/78 (10/06 1234) Pulse Rate: 98 (10/06 1234) Intake/Output from previous day: 10/05 0701 - 10/06 0700 In: 2550 [P.O.:960; I.V.:1100; NG/GT:390; IV Piggyback:100] Out: 220 [Urine:220] Intake/Output from this shift: Total I/O In: 340 [P.O.:340] Out: -   Labs: Recent Labs    11/19/17 1100 11/20/17 0420 11/21/17 0735  WBC  --  10.5  --   HGB  --  8.2*  --   HCT  --  28.0*  --   PLT  --  160  --   CREATININE  --  3.27* 3.95*  LABCREA 18.33  --   --   CREAT24HRUR 128*  --   --   ALBUMIN  --   --  1.8*  PROT  --   --  5.7*  AST  --   --  29  ALT  --   --  12  ALKPHOS  --   --  93  BILITOT  --   --  0.2*   Estimated Creatinine Clearance: 10.5 mL/min (A) (by C-G formula based on SCr of 3.95 mg/dL (H)).    Medical History: Past Medical History:  Diagnosis Date  . Alcohol-induced pancreatitis   . Chronic diarrhea   . Depression   . Diabetes mellitus    fasting blood sugar 110-120s  . Diastolic CHF ([place])   . DKA (diabetic ketoacidoses) ([place])   . Gastroparesis   . GERD (gastroesophageal reflux disease)   . Heart murmur   . History of kidney stones   . Hyperlipidemia   . Hypertension   . Hypokalemia   . Muscle spasm   . Neuropathic pain   . Neuropathy    Hx: of  . Pyelonephritis   . Recurrent pancreatitis   . Seizures ([place])   . Vitamin B12 deficiency   . Vitamin D deficiency     Medications:  Scheduled:  .  stroke: mapping our early stages of recovery book   Does not apply Once  . sodium chloride   Intravenous Once  . Chlorhexidine Gluconate Cloth  6 each Topical Q0600  .  darbepoetin (ARANESP) injection - DIALYSIS  60 mcg Intravenous Q Tue-HD  . feeding supplement (NEPRO CARB STEADY)  237 mL Oral BID BM  . heparin injection (subcutaneous)  5,000 Units Subcutaneous Q8H  . [START ON 11/22/2017] heparin injection (subcutaneous)  5,000 Units Subcutaneous Q8H  . insulin aspart  0-9 Units Subcutaneous TID WC  . levETIRAcetam  1,000 mg Oral Daily  . phenytoin  125 mg Oral BID  . saccharomyces boulardii  250 mg Oral BID  . sodium chloride flush  10-40 mL Intracatheter Q12H  . topiramate  25 mg Oral BID    Assessment: 62 yo F recently initiated on dialysis started on phenytoin inpatient for breakthrough seizures. Neurology initially followed pt & wanted pharmacy to take over dosing for phenytoin. Pt's dose was reduced from 100mg  q8h to 125mg  q12h on 10/2.   Total phenytoin level of 10.2 but due to low albumin (1.8) in ESRD, the corrected phenytoin level is 22, which is slightly above goal.  Phenytoin not yet at steady state from dose change on 10/2, but  is trending down appropriately from previous corrected level of 26.9. No documented symptoms of concentration-dependent adverse reactions.   Plan:  Maintain dose of phenytoin at 125mg  q12h. Corrected phenytoin level goal: 10-20. Check level in 2 or 3 days to assess steady state level  Harrietta Guardian, PharmD PGY1 Pharmacy Resident 11/21/2017    2:26 PM

## 2017-11-21 NOTE — Progress Notes (Addendum)
[facility] PROGRESS NOTE  Erica [name] UKG:[id number] DOB: 1955/07/07 DOA: 11/08/2017  PCP: Octavio Graves, DO  Brief History/Interval Summary: 62 year old African-American female with past medical history of diabetes mellitus type 2, essential hypertension, history of seizure disorder presented with 2 to 3 days of confusion. The patient was also complaining of lower abd pain with dysuria.The patient was recently hospitalized at [facility] from 10/07/2017 through 10/21/2017 during which she was treated for emphysematous pyelonephritis and acute on chronic renal failure. She also was treated for DKA and fluid overload.She was noted to have hemoglobin A1c of 12.1. She required transfusion 2 u PRBC without evidence of active bleeding.She underwent bilateral ureteral stenting on 10/07/2017. She was treated appropriately with antibiotics. She was discharged with instructions for outpatient cystoscopy and removal of stents in 2 to 3 weeks. At the time of discharge, her serum creatinine has stabilized between 3.7-3.9.Upon thisadmission, the patient has serum creatinine of 8.1. Nephrology was consulted to assist with management. Urology was also consulted due to retained stents--they are currently planning to remove stents. Patient had seizures 11/14/2017, she was transferred to [facility] for further work-up , her MRI brain significant for scattered infarcts, as well her seizures has been managed by neurology, hemodialysis has been continued during this hospital stay.   Consultants: Nephrology.  Urology.  Neurology.  Procedures:   Transthoracic echocardiogram Study Conclusions  - Left ventricle: The cavity size was normal. Moderate focal basal   septal hypertrophy. Mild hypertrophy of the rest of the   ventricle. No LV outflow tract gradient noted. Systolic function   was normal. The estimated ejection fraction was in the range of   60% to 65%. Wall motion was  normal; there were no regional wall   motion abnormalities. Doppler parameters are consistent with   abnormal left ventricular relaxation (grade 1 diastolic   dysfunction). - Aortic valve: There was no stenosis. There was trivial   regurgitation. - Mitral valve: There was chordal systolic anterior motion but not   valvular SAM. Moderately calcified annulus. There was trivial   regurgitation. - Right ventricle: The cavity size was normal. Systolic function   was normal. - Right atrium: Catheter noted in RA. - Tricuspid valve: Peak RV-RA gradient (S): 47 mm Hg. - Pulmonary arteries: PA peak pressure: 50 mm Hg (S). - Inferior vena cava: The vessel was normal in size. The   respirophasic diameter changes were in the normal range (>= 50%),   consistent with normal central venous pressure.  Impressions:  - Normal LV size with moderate asymmetric basal septal hypertrophy,   mild hypertrophy of the rest of the ventricle. No LVOT gradient.   There was chordal but not mitral valve SAM. Normal RV size and   systolic function. No significant valvular abnormalities.   Differential is LVH due to HTN versus a variant of hypertrophic   cardiomyopathy.  Antibiotics: Ceftriaxone 9/23  Subjective/Interval History: She states that she feels well.  No complaints offered.  ROS: No nausea or vomiting  Objective:  Vital Signs  Vitals:   11/20/17 2000 11/20/17 2330 11/21/17 0329 11/21/17 0831  BP: (!) 149/86 138/76 (!) 147/77 139/77  Pulse: (!) 101 (!) 10 (!) 102 98  Resp: (!) 21 13 17 12   Temp:  98.7 F (37.1 C) 98.8 F (37.1 C) 99 F (37.2 C)  TempSrc:  Oral Oral Oral  SpO2: 98% 97% 100% 100%  Weight:      Height:  Intake/Output Summary (Last 24 hours) at 11/21/2017 1239 Last data filed at 11/21/2017 0900 Gross per 24 hour  Intake 1880 ml  Output 100 ml  Net 1780 ml   Filed Weights   11/13/17 2230 11/16/17 1505 11/16/17 1805  Weight: 50 kg 50.4 kg 50 kg    General  appearance: Awake alert.  In no distress Resp: Clear to auscultation bilaterally Cardio: S1-S2 is normal regular. GI: Abdomen soft.  Nontender nondistended Extremities: No pedal edema Neurologic: No changes in neurological exam.  Subtle left-sided weakness.  Lab Results:  Data Reviewed: I have personally reviewed following labs and imaging studies  CBC: Recent Labs  Lab 11/14/17 1615 11/15/17 0807 11/16/17 0514 11/18/17 0434 11/20/17 0420  WBC 10.7* 10.3 14.9* 13.5* 10.5  HGB 7.0* 7.7* 7.2* 8.6* 8.2*  HCT 22.9* 25.9* 23.7* 28.7* 28.0*  MCV 89.8 91.8 92.2 89.1 93.0  PLT 258 242 259 188 109    Basic Metabolic Panel: Recent Labs  Lab 11/14/17 1615 11/15/17 0807  11/16/17 0514 11/17/17 2245 11/18/17 0434 11/20/17 0420 11/21/17 0735  NA 135 139   < > 138 140 141 140 136  K 3.6 3.7   < > 3.6 3.4* 3.2* 4.5 4.7  CL 99 103   < > 105 107 109 113* 112*  CO2 23 24   < > 19* 23 26 20* 17*  GLUCOSE 125* 93   < > 202* 107* 74 167* 163*  BUN 22 23   < > 24* 14 14 8 14   CREATININE 4.81* 5.08*   < > 5.48* 3.71* 3.93* 3.27* 3.95*  CALCIUM 7.0* 7.6*   < > 7.2* 7.6* 7.5* 7.8* 7.8*  PHOS 4.4 4.4  --  4.2  --   --   --   --    < > = values in this interval not displayed.    GFR: Estimated Creatinine Clearance: 10.5 mL/min (A) (by C-G formula based on SCr of 3.95 mg/dL (H)).  Liver Function Tests: Recent Labs  Lab 11/14/17 1615 11/15/17 0807 11/15/17 0924 11/16/17 0514 11/21/17 0735  AST  --   --  24  --  29  ALT  --   --  12  --  12  ALKPHOS  --   --  69  --  93  BILITOT  --   --  0.9  --  0.2*  PROT  --   --  5.8*  --  5.7*  ALBUMIN 1.8* 1.9* 1.9* 1.8* 1.8*    CBG: Recent Labs  Lab 11/20/17 1058 11/20/17 1615 11/20/17 2142 11/21/17 0606 11/21/17 1100  GLUCAP 126* 228* 123* 164* 130*     Recent Results (from the past 240 hour(s))  C difficile quick scan w PCR reflex     Status: None   Collection Time: 11/13/17 11:17 PM  Result Value Ref Range Status   C  Diff antigen NEGATIVE NEGATIVE Final   C Diff toxin NEGATIVE NEGATIVE Final   C Diff interpretation No C. difficile detected.  Final    Comment: VALID Performed at [facility], [address], [place], [place] [address]   MRSA PCR Screening     Status: None   Collection Time: 11/14/17  5:26 AM  Result Value Ref Range Status   MRSA by PCR NEGATIVE NEGATIVE Final    Comment:        The GeneXpert MRSA Assay (FDA approved for NASAL specimens only), is one component of a comprehensive MRSA colonization surveillance program. It  is not intended to diagnose MRSA infection nor to guide or monitor treatment for MRSA infections. Performed at [facility], [place] [address], [place], [place] [address]   Culture, blood (single)     Status: None (Preliminary result)   Collection Time: 11/16/17  5:10 PM  Result Value Ref Range Status   Specimen Description BLOOD CENTRAL LINE  Final   Special Requests   Final    BOTTLES DRAWN AEROBIC AND ANAEROBIC Blood Culture results may not be optimal due to an excessive volume of blood received in culture bottles   Culture   Final    NO GROWTH 4 DAYS Performed at [facility], [place] [address], [place], [place] [address]    Report Status PENDING  Incomplete      Radiology Studies: No results found.   Medications:  Scheduled: .  stroke: mapping our early stages of recovery book   Does not apply Once  . sodium chloride   Intravenous Once  . Chlorhexidine Gluconate Cloth  6 each Topical Q0600  . darbepoetin (ARANESP) injection - DIALYSIS  60 mcg Intravenous Q Tue-HD  . feeding supplement (NEPRO CARB STEADY)  237 mL Oral BID BM  . heparin injection (subcutaneous)  5,000 Units Subcutaneous Q8H  . [START ON 11/22/2017] heparin injection (subcutaneous)  5,000 Units Subcutaneous Q8H  . insulin aspart  0-9 Units Subcutaneous TID WC  . levETIRAcetam  1,000 mg Oral Daily  . phenytoin  125 mg Oral BID  . saccharomyces boulardii  250 mg Oral BID  .  sodium chloride flush  10-40 mL Intracatheter Q12H  . topiramate  25 mg Oral BID   Continuous: . sodium chloride Stopped (11/11/17 1911)  . sodium chloride 100 mL/hr at 11/20/17 0934  . cefTRIAXone (ROCEPHIN)  IV 2 g (11/20/17 2253)  . levETIRAcetam     QJJ:HERDEY chloride, acetaminophen **OR** acetaminophen, levETIRAcetam, loperamide, ondansetron **OR** ondansetron (ZOFRAN) IV, RESOURCE THICKENUP CLEAR, senna-docusate, sodium chloride flush  Assessment/Plan:    Acute on chronic renal failure--stage unspecified presently -etiology likely infection and hemodynamic changes/ATN -Nephrology is following.  Patient was dialyzed.  Foley catheter was placed and 24-hour urine collection was completed.  Looks like Foley catheter has been discontinued.   -Non-tunneled HD catheter placed by interventional radiology.   Creatinine remains elevated.  Further management per nephrology.    Acute pyelonephritis -11/08/17 urine culture = Ecoli -Patient was started on ceftriaxone on 9/23.  She will complete 2 weeks of treatment on 10/7.  Will discontinue ceftriaxone after that. -11/08/2017 CT renal protocol--bilateral ureteral stents without hydronephrosis. Small bilateral renal calculi.  -ureteral stents initially placed 10/07/17 -Discussed with Dr. Alyson Ingles with urology.  No need for removal of stents during this hospital stay.  This can be pursued in the office.  Okay to place Foley catheter.  Seizure disorder/Left Hemiparesis -Seizures appear to be new onset after dialysis.  Patient was started on antiepileptics.  Noted to be on Dilantin and Keppra and Topamax.  No further twitching episodes noted.  Neurology has been following.  MRA head did not show any significant stenosis.  Noted to be on Topamax, phenytoin and Keppra.  Neurology has signed off.  She remains stable from a neurological standpoint.  Acute CVA  -MRI significant for watershed infarction at both frontal and parietal varices.   Patient with subtle left-sided weakness.  Stable.  PT and OT evaluation.  CIR versus skilled nursing facility.  Diabetes mellitus type 2, uncontrolled with hyperglycemia Patient was on Lantus  insulin.  She was noted to have episode of hypoglycemia on the morning of 10/4.  Her CBGs have been borderline low and so Lantus discontinued.  HbA1c was 7.0 on 10/3.  It looks like her oral intake has improved.  CBGs noted to be elevated.  However we will continue to hold off on long-acting insulin for now.  Essential hypertension Patient was taken off of amlodipine and metoprolol due to soft blood pressures initially.  Blood pressure is reasonably well controlled. Echocardiogram showed normal systolic function.  Asymmetric septal hypertrophy was noted.  Will need outpatient follow-up with cardiology eventually.  Cannot use ACE inhibitor due to renal failure.  Metabolic acidosis Secondary to acute on chronic renal failure.  Patient was on bicarbonate infusion initially but this was discontinued as her acidosis improved.  Bicarbonate level noted to be low this morning.  It appears the plan is for dialysis tomorrow.    Diarrhea Negative C. difficile.  Imodium as needed.  Severe protein calorie malnutrition Continue nutritional supplements.  Normocytic Anemia -Hemoglobin  was around 9 during recent hospital stay in [place], and was around 9 on admission, slowly drifting down.  Patient was transfused 1 unit of PRBC on 10/1.  No evidence for acute blood loss.  Hemoglobin has been stable. -Anemia of chronic blood loss, and chronic illness and kidney disease,.  She has received Aranesp. -Hemoccult positive, this is most likely in the setting diarrhea, as she has Flexi-Seal with green-colored stool in the bag, no significant evidence of GI bleed, continue Protonix 40 twice daily.  No GI bleed noted.  Outpatient work-up.  DVT Prophylaxis: Subcutaneous heparin    Code Status: Full code Family  Communication: No family at bedside Disposition Plan: Management as outlined above.  Inpatient rehab recommended by PT. she is not yet medically ready for discharge.    LOS: 13 days   [facility] Pager [phone] 11/21/2017, 12:39 PM  If 7PM-7AM, please contact night-coverage at [email or web address], password [facility]

## 2017-11-21 NOTE — Progress Notes (Addendum)
[facility] Progress Note   Subjective:    She has no complaints. 24h urine collection completed -- Uvol 734mL, crcl 3.  Objective Vitals:   11/20/17 2000 11/20/17 2330 11/21/17 0329 11/21/17 0831  BP: (!) 149/86 138/76 (!) 147/77 139/77  Pulse: (!) 101 (!) 10 (!) 102 98  Resp: (!) 21 13 17 12   Temp:  98.7 F (37.1 C) 98.8 F (37.1 C) 99 F (37.2 C)  TempSrc:  Oral Oral Oral  SpO2: 98% 97% 100% 100%  Weight:      Height:       Physical Exam General: lying flat comfortably Heart: RRR, no rub Lungs: normal WOB, clear anteriorly Abdomen: soft, nontender Extremities: no LE edema GU: +foley draining yellow urine Dialysis Access:  RIJ temp cath c/d/i  Additional Objective Labs: Basic Metabolic Panel: Recent Labs  Lab 11/14/17 1615 11/15/17 0807  11/16/17 0514  11/18/17 0434 11/20/17 0420 11/21/17 0735  NA 135 139   < > 138   < > 141 140 136  K 3.6 3.7   < > 3.6   < > 3.2* 4.5 4.7  CL 99 103   < > 105   < > 109 113* 112*  CO2 23 24   < > 19*   < > 26 20* 17*  GLUCOSE 125* 93   < > 202*   < > 74 167* 163*  BUN 22 23   < > 24*   < > 14 8 14   CREATININE 4.81* 5.08*   < > 5.48*   < > 3.93* 3.27* 3.95*  CALCIUM 7.0* 7.6*   < > 7.2*   < > 7.5* 7.8* 7.8*  PHOS 4.4 4.4  --  4.2  --   --   --   --    < > = values in this interval not displayed.   Liver Function Tests: Recent Labs  Lab 11/15/17 0924 11/16/17 0514 11/21/17 0735  AST 24  --  29  ALT 12  --  12  ALKPHOS 69  --  93  BILITOT 0.9  --  0.2*  PROT 5.8*  --  5.7*  ALBUMIN 1.9* 1.8* 1.8*   No results for input(s): LIPASE, AMYLASE in the last 168 hours. CBC: Recent Labs  Lab 11/14/17 1615 11/15/17 0807 11/16/17 0514 11/18/17 0434 11/20/17 0420  WBC 10.7* 10.3 14.9* 13.5* 10.5  HGB 7.0* 7.7* 7.2* 8.6* 8.2*  HCT 22.9* 25.9* 23.7* 28.7* 28.0*  MCV 89.8 91.8 92.2 89.1 93.0  PLT 258 242 259 188 160   Blood Culture    Component Value Date/Time   SDES BLOOD CENTRAL LINE 11/16/2017 1710    SPECREQUEST  11/16/2017 1710    BOTTLES DRAWN AEROBIC AND ANAEROBIC Blood Culture results may not be optimal due to an excessive volume of blood received in culture bottles   CULT  11/16/2017 1710    NO GROWTH 4 DAYS Performed at [facility], [place] [address], [place], [place] [address]    REPTSTATUS PENDING 11/16/2017 1710    Cardiac Enzymes: No results for input(s): CKTOTAL, CKMB, CKMBINDEX, TROPONINI in the last 168 hours. CBG: Recent Labs  Lab 11/20/17 1058 11/20/17 1615 11/20/17 2142 11/21/17 0606 11/21/17 1100  GLUCAP 126* 228* 123* 164* 130*   Iron Studies:  No results for input(s): IRON, TIBC, TRANSFERRIN, FERRITIN in the last 72 hours. @lablastinr3 @ Studies/Results: No results found. Medications: . sodium chloride Stopped (11/11/17 1911)  . sodium chloride 100 mL/hr at 11/20/17  0383  . cefTRIAXone (ROCEPHIN)  IV 2 g (11/20/17 2253)  . levETIRAcetam     .  stroke: mapping our early stages of recovery book   Does not apply Once  . sodium chloride   Intravenous Once  . Chlorhexidine Gluconate Cloth  6 each Topical Q0600  . darbepoetin (ARANESP) injection - DIALYSIS  60 mcg Intravenous Q Tue-HD  . feeding supplement (NEPRO CARB STEADY)  237 mL Oral BID BM  . heparin injection (subcutaneous)  5,000 Units Subcutaneous Q8H  . insulin aspart  0-9 Units Subcutaneous TID WC  . levETIRAcetam  1,000 mg Oral Daily  . phenytoin  125 mg Oral BID  . saccharomyces boulardii  250 mg Oral BID  . sodium chloride flush  10-40 mL Intracatheter Q12H  . topiramate  25 mg Oral BID     Assessment/Plan:  1.  AKI on CKD:  Recent course with emphysematous pyelonephritis treated at [facility], urologic intervention double J stents placed.  During that admission Cr ~ 8 on presentation, down to 4 on discharge.  Last known Cr 0.8 in 2017.  Represented 2 weeks later (10/2017) with Cr up to 11.   She was initially hydrated but due lack of improved had HD x 2 at [facility] prior to transfer  here on 9/29. She has required ongoing HD here.   Cr running in high 3s but 24h urine crcl with clearance of 36ml/min/1.73m2 showing she will need to continue dialysis for now.  Her line will need to be tunneled and I ordered this and made her NPOpMN, hold heparin SQ tonight and in AM.  I have not ordered permanent vascular access placement yet as her renal US shows normal size and echogenicity of kidneys and I think this should still be considered AKI.  Plan HD tomorrow.  Will need to start process for outpatient dialysis tomorrow but will continue to monitor for recovery of renal function.   2.   HTN:   Reasonable control for now.  BP has ranged 130-140 in past 24h. She is on no antiHTN here.   3.  Anemia:  Hb has trended down significantly from 10.1 on 9/23 to 7.2 and she rec'd transfusion 10/1.  Stable today at 8.2 10/5.  Iron indices 9/30 show iron replete.  Hemoccult + and started on PPI but no signs of gross bleeding.  Given aranesp 104 with HD 10/1.    4.  BMM: phos 4.2, corr ca normal (albumin 1.8).   5. Seizures:  H/o hypoglycemia induced seizures; neurology following here and dosing meds for dialysis at this time.  She appears to have improved.    6.  E coli UTI: Rocephin 2g daily since 9/26.  Has ureteral stents in place from prior hospitalization.  Urology supposed to remove while she is here.    7.  DM: per primary.   Jannifer Hick MD 11/21/2017, 11:44 AM  [facility]

## 2017-11-22 ENCOUNTER — Inpatient Hospital Stay (HOSPITAL_COMMUNITY): Payer: Medicaid Other

## 2017-11-22 HISTORY — PX: IR FLUORO GUIDE CV LINE RIGHT: IMG2283

## 2017-11-22 LAB — BASIC METABOLIC PANEL
Anion gap: 8 (ref 5–15)
BUN: 18 mg/dL (ref 8–23)
CHLORIDE: 114 mmol/L — AB (ref 98–111)
CO2: 16 mmol/L — ABNORMAL LOW (ref 22–32)
CREATININE: 4.44 mg/dL — AB (ref 0.44–1.00)
Calcium: 7.8 mg/dL — ABNORMAL LOW (ref 8.9–10.3)
GFR calc Af Amer: 11 mL/min — ABNORMAL LOW (ref >60)
GFR, EST NON AFRICAN AMERICAN: 10 mL/min — AB (ref >60)
Glucose, Bld: 148 mg/dL — ABNORMAL HIGH (ref 70–99)
Potassium: 5 mmol/L (ref 3.5–5.1)
SODIUM: 138 mmol/L (ref 135–145)

## 2017-11-22 LAB — GLUCOSE, CAPILLARY
GLUCOSE-CAPILLARY: 167 mg/dL — AB (ref 70–99)
GLUCOSE-CAPILLARY: 137 mg/dL — AB (ref 70–99)
GLUCOSE-CAPILLARY: 92 mg/dL (ref 70–99)
GLUCOSE-CAPILLARY: 156 mg/dL — AB (ref 70–99)
Glucose-Capillary: 79 mg/dL (ref 70–99)

## 2017-11-22 LAB — CBC
HCT: 26.2 % — ABNORMAL LOW (ref 36.0–46.0)
HEMOGLOBIN: 7.7 g/dL — AB (ref 12.0–15.0)
MCH: 27.9 pg (ref 26.0–34.0)
MCHC: 29.4 g/dL — ABNORMAL LOW (ref 30.0–36.0)
MCV: 94.9 fL (ref 78.0–100.0)
PLATELETS: 210 10*3/uL (ref 150–400)
RBC: 2.76 MIL/uL — AB (ref 3.87–5.11)
RDW: 20.1 % — ABNORMAL HIGH (ref 11.5–15.5)
WBC: 11.1 10*3/uL — ABNORMAL HIGH (ref 4.0–10.5)

## 2017-11-22 LAB — PROTIME-INR
INR: 1.41
PROTHROMBIN TIME: 17.1 s — AB (ref 11.4–15.2)

## 2017-11-22 MED ORDER — PENTAFLUOROPROP-TETRAFLUOROETH EX AERO: 1.0000 "application " | INHALATION_SPRAY | CUTANEOUS | Status: DC | PRN

## 2017-11-22 MED ORDER — ALTEPLASE 2 MG IJ SOLR
2.0000 mg | Freq: Once | INTRAMUSCULAR | Status: DC | PRN
  Filled 2017-11-22: qty 2

## 2017-11-22 MED ORDER — HEPARIN SODIUM (PORCINE) 1000 UNIT/ML IJ SOLN
INTRAMUSCULAR | Status: AC
  Administered 2017-11-22: 3.2 mL
  Filled 2017-11-22: qty 1

## 2017-11-22 MED ORDER — HEPARIN SODIUM (PORCINE) 1000 UNIT/ML DIALYSIS: 1000.0000 [IU] | INTRAMUSCULAR | Status: DC | PRN

## 2017-11-22 MED ORDER — HEPARIN SODIUM (PORCINE) 1000 UNIT/ML IJ SOLN
INTRAMUSCULAR | Status: AC
  Administered 2017-11-22: 1000 [IU]
  Filled 2017-11-22: qty 3

## 2017-11-22 MED ORDER — LIDOCAINE HCL (PF) 1 % IJ SOLN: 5.0000 mL | INTRAMUSCULAR | Status: DC | PRN

## 2017-11-22 MED ORDER — SODIUM CHLORIDE 0.9 % IV SOLN: 100.0000 mL | INTRAVENOUS | Status: DC | PRN

## 2017-11-22 MED ORDER — BACITRACIN ZINC 500 UNIT/GM EX OINT
TOPICAL_OINTMENT | Freq: Two times a day (BID) | CUTANEOUS | Status: DC
  Administered 2017-11-22 – 2017-11-26 (×9): via TOPICAL
  Administered 2017-11-26: 1 via TOPICAL
  Administered 2017-11-27 – 2017-11-29 (×5): via TOPICAL
  Filled 2017-11-22 (×2): qty 28.4

## 2017-11-22 MED ORDER — CHLORHEXIDINE GLUCONATE 4 % EX LIQD
CUTANEOUS | Status: AC
  Filled 2017-11-22: qty 15

## 2017-11-22 MED ORDER — HEPARIN SODIUM (PORCINE) 1000 UNIT/ML DIALYSIS
1000.0000 [IU] | INTRAMUSCULAR | Status: DC | PRN
  Filled 2017-11-22: qty 1

## 2017-11-22 MED ORDER — MIDAZOLAM HCL 2 MG/2ML IJ SOLN
INTRAMUSCULAR | Status: AC
  Filled 2017-11-22: qty 2

## 2017-11-22 MED ORDER — LIDOCAINE HCL (PF) 1 % IJ SOLN
INTRAMUSCULAR | Status: AC | PRN
  Administered 2017-11-22: 8 mL

## 2017-11-22 MED ORDER — LIDOCAINE HCL 1 % IJ SOLN
INTRAMUSCULAR | Status: AC
  Filled 2017-11-22: qty 20

## 2017-11-22 MED ORDER — MIDAZOLAM HCL 2 MG/2ML IJ SOLN
INTRAMUSCULAR | Status: AC | PRN
  Administered 2017-11-22: 1 mg via INTRAVENOUS

## 2017-11-22 MED ORDER — ALTEPLASE 2 MG IJ SOLR: 2.0000 mg | Freq: Once | INTRAMUSCULAR | Status: DC | PRN

## 2017-11-22 MED ORDER — LIDOCAINE-PRILOCAINE 2.5-2.5 % EX CREA
1.0000 "application " | TOPICAL_CREAM | CUTANEOUS | Status: DC | PRN
  Filled 2017-11-22: qty 5

## 2017-11-22 MED ORDER — FENTANYL CITRATE (PF) 100 MCG/2ML IJ SOLN
INTRAMUSCULAR | Status: AC
  Filled 2017-11-22: qty 2

## 2017-11-22 MED ORDER — LIDOCAINE-PRILOCAINE 2.5-2.5 % EX CREA: 1.0000 "application " | TOPICAL_CREAM | CUTANEOUS | Status: DC | PRN

## 2017-11-22 MED ORDER — FENTANYL CITRATE (PF) 100 MCG/2ML IJ SOLN
INTRAMUSCULAR | Status: AC | PRN
  Administered 2017-11-22: 50 ug via INTRAVENOUS

## 2017-11-22 NOTE — Progress Notes (Signed)
Physical Therapy Treatment Patient Details Name: Erica [name] MRN: [id number] DOB: 1955/11/26 Today's Date: 11/22/2017    History of Present Illness Pt is a 62 y/o female presents with Lft facial, Lft UE and Lft LE twitching at dialysis and admitted with a breakthrough seizure. 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 PMH includes DM with history of DKA, HTN, HLD, CKD, cardiac murmur, chronic diarrhea, nephrolithiasis with history of pyelonephritis, gastroparesis with GERD and a history of recurrent alcohol-associated pancreatitis, and a history of major depression disorder.     PT Comments    Patient progressing well towards PT goals. Tolerated gait training with assist of 2 for safety. Demonstrates left sided ataxia with all mobility. Requires assist for weight shifting to advance LEs. Exhibits right lateral and posterior lean. HR up to 123 bpm during activity. Pt making marked improvements. Plan for possible permanent dialysis catheter today. Great CIR candidate. Will follow.    Follow Up Recommendations  CIR;Supervision for mobility/OOB     Equipment Recommendations  None recommended by PT    Recommendations for Other Services       Precautions / Restrictions Precautions Precautions: Fall Precaution Comments: seizure; temp triple lumen dialysis catheter Restrictions Weight Bearing Restrictions: No    Mobility  Bed Mobility Overal bed mobility: Needs Assistance Bed Mobility: Rolling;Sidelying to Sit Rolling: Min guard Sidelying to sit: HOB elevated;Min assist       General bed mobility comments: Step by step cues to get to EOB, use of rail.  Transfers Overall transfer level: Needs assistance Equipment used: 1 person hand held assist Transfers: Sit to/from Stand Sit to Stand: Min assist Stand pivot transfers: Mod assist       General transfer comment: Stood from EOB x2, from [facility] x2, SPT bed to/from [facility] with mod A. Assist  for weight shifting to advance LEs. HR up to 123 bpm.  Ambulation/Gait Ambulation/Gait assistance: Mod assist;+2 physical assistance Gait Distance (Feet): 40 Feet Assistive device: 2 person hand held assist Gait Pattern/deviations: Step-through pattern;Ataxic;Leaning posteriorly;Decreased stride length;Decreased step length - right Gait velocity: decreased   General Gait Details: Slow, unsteady gait with ataxic like gait pattern through LUE/LE; assist with weight shifting to advance LEs; right lateral and posterior trunk lean.   Stairs             Wheelchair Mobility    Modified Rankin (Stroke Patients Only) Modified Rankin (Stroke Patients Only) Pre-Morbid Rankin Score: Moderate disability Modified Rankin: Moderately severe disability     Balance Overall balance assessment: Needs assistance Sitting-balance support: Feet supported;Bilateral upper extremity supported Sitting balance-Leahy Scale: Fair   Postural control: Left lateral lean;Posterior lean Standing balance support: During functional activity;Bilateral upper extremity supported Standing balance-Leahy Scale: Poor Standing balance comment: Requires external support for standing balance. Total A for pericare, requires UE support as well to maintain balance.                             Cognition Arousal/Alertness: Awake/alert Behavior During Therapy: Flat affect Overall Cognitive Status: No family/caregiver present to determine baseline cognitive functioning Area of Impairment: Problem solving;Following commands                       Following Commands: Follows one step commands with increased time;Follows multi-step commands inconsistently     Problem Solving: Slow processing;Difficulty sequencing;Decreased initiation;Requires verbal cues        Exercises  General Comments        Pertinent Vitals/Pain Pain Assessment: No/denies pain    Home Living                       Prior Function            PT Goals (current goals can now be found in the care plan section) Progress towards PT goals: Progressing toward goals    Frequency    Min 4X/week      PT Plan Current plan remains appropriate    Co-evaluation PT/OT/SLP Co-Evaluation/Treatment: Yes Reason for Co-Treatment: To address functional/ADL transfers;For patient/therapist safety;Complexity of the patient's impairments (multi-system involvement) PT goals addressed during session: Mobility/safety with mobility;Balance        AM-PAC PT "6 Clicks" Daily Activity  Outcome Measure  Difficulty turning over in bed (including adjusting bedclothes, sheets and blankets)?: A Little Difficulty moving from lying on back to sitting on the side of the bed? : Unable Difficulty sitting down on and standing up from a chair with arms (e.g., wheelchair, bedside commode, etc,.)?: Unable Help needed moving to and from a bed to chair (including a wheelchair)?: A Lot Help needed walking in hospital room?: A Lot Help needed climbing 3-5 steps with a railing? : A Lot 6 Click Score: 11    End of Session Equipment Utilized During Treatment: Gait belt Activity Tolerance: Patient tolerated treatment well Patient left: in chair;with call bell/phone within reach;with chair alarm set Nurse Communication: Mobility status PT Visit Diagnosis: Hemiplegia and hemiparesis;Difficulty in walking, not elsewhere classified (R26.2) Hemiplegia - Right/Left: Left Hemiplegia - dominant/non-dominant: Non-dominant Hemiplegia - caused by: Cerebral infarction     Time: 1316-1350 PT Time Calculation (min) (ACUTE ONLY): 34 min  Charges:  $Gait Training: 8-22 mins                     Wray Kearns, PT, DPT Acute Rehabilitation Services Pager [phone] Office [place] 11/22/2017, 2:19 PM

## 2017-11-22 NOTE — Progress Notes (Signed)
Occupational Therapy Treatment Patient Details Name: Erica [name] MRN: [id number] DOB: 1955/08/04 Today's Date: 11/22/2017    History of present illness Pt is a 62 y/o female presents with Lft facial, Lft UE and Lft LE twitching at dialysis and admitted with a breakthrough seizure. 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 PMH includes DM with history of DKA, HTN, HLD, CKD, cardiac murmur, chronic diarrhea, nephrolithiasis with history of pyelonephritis, gastroparesis with GERD and a history of recurrent alcohol-associated pancreatitis, and a history of major depression disorder.    OT comments  This 62 yo female admitted with above seen today in partial conjunction with PT for mobility. Pt making progress with basic ADLs with less ataxia seen in LUE today and less A overall for mobility as compared to last session. She will continue to benefit from acute OT with follow up OT on CIR.  Follow Up Recommendations  Supervision/Assistance - 24 hour;CIR    Equipment Recommendations  Other (comment)(TBD at next venue of care)       Precautions / Restrictions Precautions Precautions: Fall Precaution Comments: seizure; temp IJ triple lumen dialysis catheter Restrictions Weight Bearing Restrictions: No       Mobility Bed Mobility Overal bed mobility: Needs Assistance Bed Mobility: Rolling;Sidelying to Sit Rolling: Min guard Sidelying to sit: HOB elevated;Min assist       General bed mobility comments: Step by step cues to get to EOB, use of rail.  Transfers Overall transfer level: Needs assistance Equipment used: 1 person hand held assist Transfers: Sit to/from Stand Sit to Stand: Min assist Stand pivot transfers: Mod assist       General transfer comment: Stood from EOB x2, from [facility] x2, SPT bed to/from [facility] with mod A. Assist for weight shifting to advance LEs. HR up to 123 bpm.    Balance Overall balance assessment: Needs  assistance Sitting-balance support: Feet supported;Bilateral upper extremity supported Sitting balance-Leahy Scale: Fair   Postural control: Left lateral lean;Posterior lean Standing balance support: During functional activity;Bilateral upper extremity supported Standing balance-Leahy Scale: Poor Standing balance comment: Requires external support for standing balance. Total A for pericare, requires UE support as well to maintain balance.                            ADL either performed or assessed with clinical judgement   ADL Overall ADL's : Needs assistance/impaired   Eating/Feeding Details (indicate cue type and reason): was able to independently hold thickened juice cup with taking her meds Grooming: Oral care;Brushing hair;Set up;Supervision/safety Grooming Details (indicate cue type and reason): Pt using both hands for both tasks with LUE moving more slowly then RUE               Lower Body Dressing Details (indicate cue type and reason): Pt able to cross her legs to get to her socks to doff and donn her socks Toilet Transfer: Moderate assistance;Stand-pivot;BSC   Toileting- Clothing Manipulation and Hygiene: Total assistance Toileting - Clothing Manipulation Details (indicate cue type and reason): with Mod A sit<>stand             Vision Baseline Vision/History: Wears glasses Wears Glasses: At all times            Cognition Arousal/Alertness: Awake/alert Behavior During Therapy: Flat affect Overall Cognitive Status: No family/caregiver present to determine baseline cognitive functioning Area of Impairment: Problem solving;Following commands  Following Commands: Follows one step commands with increased time;Follows multi-step commands inconsistently     Problem Solving: Slow processing;Difficulty sequencing;Decreased initiation;Requires verbal cues                     Pertinent Vitals/ Pain       Pain  Assessment: No/denies pain         Frequency  Min 2X/week        Progress Toward Goals  OT Goals(current goals can now be found in the care plan section)  Progress towards OT goals: Progressing toward goals     Plan Frequency remains appropriate;Discharge plan remains appropriate    Co-evaluation    PT/OT/SLP Co-Evaluation/Treatment: (partial, extra OT time) Reason for Co-Treatment: Complexity of the patient's impairments (multi-system involvement);For patient/therapist safety;To address functional/ADL transfers PT goals addressed during session: Mobility/safety with mobility;Balance OT goals addressed during session: ADL's and self-care;Strengthening/ROM      AM-PAC PT "6 Clicks" Daily Activity     Outcome Measure   Help from another person eating meals?: A Little Help from another person taking care of personal grooming?: A Little Help from another person toileting, which includes using toliet, bedpan, or urinal?: A Lot Help from another person bathing (including washing, rinsing, drying)?: A Lot Help from another person to put on and taking off regular upper body clothing?: A Lot Help from another person to put on and taking off regular lower body clothing?: A Lot 6 Click Score: 14    End of Session Equipment Utilized During Treatment: Gait belt  OT Visit Diagnosis: Other abnormalities of gait and mobility (R26.89);Muscle weakness (generalized) (M62.81);Other symptoms and signs involving cognitive function   Activity Tolerance Patient tolerated treatment well   Patient Left with call bell/phone within reach;in chair;with chair alarm set   Nurse Communication Mobility status        Time: 8811-0315 OT Time Calculation (min): 43 min  Charges: OT General Charges $OT Visit: 1 Visit OT Treatments $Self Care/Home Management : 23-37 mins  Golden Circle, OTR/L [place] Pager [phone] Office [phone]

## 2017-11-22 NOTE — Procedures (Signed)
I was present at this dialysis session. I have reviewed the session itself and made appropriate changes.   Pt w/o complaints.   Begin CLIP today as AKI.  IR consulted for [facility].  UOP 0.2L yesterday.   SCr 3.96-->4.44 over 24h. K 5.0 on 2K. Hb 7.7 on ESA.    Filed Weights   11/16/17 1805 11/22/17 0606 11/22/17 0815  Weight: 50 kg 57.6 kg 57 kg    Recent Labs  Lab 11/16/17 0514  11/22/17 0500  NA 138   < > 138  K 3.6   < > 5.0  CL 105   < > 114*  CO2 19*   < > 16*  GLUCOSE 202*   < > 148*  BUN 24*   < > 18  CREATININE 5.48*   < > 4.44*  CALCIUM 7.2*   < > 7.8*  PHOS 4.2  --   --    < > = values in this interval not displayed.    Recent Labs  Lab 11/18/17 0434 11/20/17 0420 11/22/17 0500  WBC 13.5* 10.5 11.1*  HGB 8.6* 8.2* 7.7*  HCT 28.7* 28.0* 26.2*  MCV 89.1 93.0 94.9  PLT 188 160 210    Scheduled Meds: .  stroke: mapping our early stages of recovery book   Does not apply Once  . sodium chloride   Intravenous Once  . Chlorhexidine Gluconate Cloth  6 each Topical Q0600  . darbepoetin (ARANESP) injection - DIALYSIS  60 mcg Intravenous Q Tue-HD  . feeding supplement (NEPRO CARB STEADY)  237 mL Oral BID BM  . heparin injection (subcutaneous)  5,000 Units Subcutaneous Q8H  . insulin aspart  0-9 Units Subcutaneous TID WC  . levETIRAcetam  1,000 mg Oral Daily  . phenytoin  125 mg Oral BID  . saccharomyces boulardii  250 mg Oral BID  . sodium chloride flush  10-40 mL Intracatheter Q12H  . topiramate  25 mg Oral BID   Continuous Infusions: . sodium chloride 500 mL (11/21/17 2323)  . sodium chloride 100 mL/hr at 11/20/17 0934  . sodium chloride    . sodium chloride    . cefTRIAXone (ROCEPHIN)  IV 2 g (11/21/17 2244)  . levETIRAcetam     PRN Meds:.sodium chloride, sodium chloride, sodium chloride, acetaminophen **OR** acetaminophen, alteplase, heparin, levETIRAcetam, lidocaine (PF), lidocaine-prilocaine, loperamide, ondansetron **OR** ondansetron (ZOFRAN) IV,  pentafluoroprop-tetrafluoroeth, RESOURCE THICKENUP CLEAR, senna-docusate, sodium chloride flush   Pearson Grippe  MD 11/22/2017, 9:07 AM

## 2017-11-22 NOTE — Progress Notes (Signed)
Pt back to unit from IR. Pt alert and verbally responsive; VSS; HD catheter intact and capped. Will continue to closely monitor pt per order and protocol. Delia Heady RN   11/22/17 1731  Vital Signs  BP (!) 145/77  BP Location Left Arm  Patient Position (if appropriate) Lying  BP Method Automatic  Pulse Rate 88  Pulse Rate Source Monitor  Resp 18  Temp 98.7 F (37.1 C)  Temp Source Oral  Oxygen Therapy  SpO2 99 %  O2 Device Room Air

## 2017-11-22 NOTE — Progress Notes (Addendum)
[facility] PROGRESS NOTE  Erica [name] GYJ:[id number] DOB: 04-22-1955 DOA: 11/08/2017  PCP: Octavio Graves, DO  Brief History/Interval Summary: 62 year old African-American female with past medical history of diabetes mellitus type 2, essential hypertension, history of seizure disorder presented with 2 to 3 days of confusion. The patient was also complaining of lower abd pain with dysuria.The patient was recently hospitalized at [facility] from 10/07/2017 through 10/21/2017 during which she was treated for emphysematous pyelonephritis and acute on chronic renal failure. She also was treated for DKA and fluid overload.She was noted to have hemoglobin A1c of 12.1. She required transfusion 2 u PRBC without evidence of active bleeding.She underwent bilateral ureteral stenting on 10/07/2017. She was treated appropriately with antibiotics. She was discharged with instructions for outpatient cystoscopy and removal of stents in 2 to 3 weeks. At the time of discharge, her serum creatinine has stabilized between 3.7-3.9.Upon thisadmission, the patient has serum creatinine of 8.1. Nephrology was consulted to assist with management. Urology was also consulted due to retained stents--they are currently planning to remove stents. Patient had seizures 11/14/2017, she was transferred to [facility] for further work-up , her MRI brain significant for scattered infarcts, as well her seizures has been managed by neurology, hemodialysis has been continued during this hospital stay.   Consultants: Nephrology.  Urology.  Neurology.  Procedures:   Transthoracic echocardiogram Study Conclusions  - Left ventricle: The cavity size was normal. Moderate focal basal   septal hypertrophy. Mild hypertrophy of the rest of the   ventricle. No LV outflow tract gradient noted. Systolic function   was normal. The estimated ejection fraction was in the range of   60% to 65%. Wall motion was  normal; there were no regional wall   motion abnormalities. Doppler parameters are consistent with   abnormal left ventricular relaxation (grade 1 diastolic   dysfunction). - Aortic valve: There was no stenosis. There was trivial   regurgitation. - Mitral valve: There was chordal systolic anterior motion but not   valvular SAM. Moderately calcified annulus. There was trivial   regurgitation. - Right ventricle: The cavity size was normal. Systolic function   was normal. - Right atrium: Catheter noted in RA. - Tricuspid valve: Peak RV-RA gradient (S): 47 mm Hg. - Pulmonary arteries: PA peak pressure: 50 mm Hg (S). - Inferior vena cava: The vessel was normal in size. The   respirophasic diameter changes were in the normal range (>= 50%),   consistent with normal central venous pressure.  Impressions:  - Normal LV size with moderate asymmetric basal septal hypertrophy,   mild hypertrophy of the rest of the ventricle. No LVOT gradient.   There was chordal but not mitral valve SAM. Normal RV size and   systolic function. No significant valvular abnormalities.   Differential is LVH due to HTN versus a variant of hypertrophic   cardiomyopathy.  Antibiotics: Ceftriaxone 9/23  Subjective/Interval History: Patient seen at dialysis.  Denies any complaints.  Objective:  Vital Signs  Vitals:   11/22/17 0830 11/22/17 0900 11/22/17 0930 11/22/17 1000  BP: (!) 148/82 (!) 167/91 (!) 168/90 (!) 166/88  Pulse: 91 93 92 92  Resp:      Temp:      TempSrc:      SpO2:      Weight:      Height:        Intake/Output Summary (Last 24 hours) at 11/22/2017 1216 Last data filed at 11/22/2017 0646 Gross per 24 hour  Intake 460 ml  Output -  Net 460 ml   Filed Weights   11/16/17 1805 11/22/17 0606 11/22/17 0815  Weight: 50 kg 57.6 kg 57 kg    General appearance: Awake alert.  In no distress Resp: Normal effort.  Clear to auscultation bilaterally. Cardio: S1-S2 is normal regular.  No  S3-S4.  No rubs murmurs or bruit GI: Abdomen soft.  Nontender nondistended. Extremities: No pedal edema Neurologic: Alert and oriented x3.  Mild left-sided weakness was appreciated.  Stable findings.  Lab Results:  Data Reviewed: I have personally reviewed following labs and imaging studies  CBC: Recent Labs  Lab 11/16/17 0514 11/18/17 0434 11/20/17 0420 11/22/17 0500  WBC 14.9* 13.5* 10.5 11.1*  HGB 7.2* 8.6* 8.2* 7.7*  HCT 23.7* 28.7* 28.0* 26.2*  MCV 92.2 89.1 93.0 94.9  PLT 259 188 160 417    Basic Metabolic Panel: Recent Labs  Lab 11/16/17 0514 11/17/17 2245 11/18/17 0434 11/20/17 0420 11/21/17 0735 11/22/17 0500  NA 138 140 141 140 136 138  K 3.6 3.4* 3.2* 4.5 4.7 5.0  CL 105 107 109 113* 112* 114*  CO2 19* 23 26 20* 17* 16*  GLUCOSE 202* 107* 74 167* 163* 148*  BUN 24* 14 14 8 14 18   CREATININE 5.48* 3.71* 3.93* 3.27* 3.95* 4.44*  CALCIUM 7.2* 7.6* 7.5* 7.8* 7.8* 7.8*  PHOS 4.2  --   --   --   --   --     GFR: Estimated Creatinine Clearance: 10.3 mL/min (A) (by C-G formula based on SCr of 4.44 mg/dL (H)).  Liver Function Tests: Recent Labs  Lab 11/16/17 0514 11/21/17 0735  AST  --  29  ALT  --  12  ALKPHOS  --  93  BILITOT  --  0.2*  PROT  --  5.7*  ALBUMIN 1.8* 1.8*    CBG: Recent Labs  Lab 11/21/17 0606 11/21/17 1100 11/21/17 1654 11/21/17 2151 11/22/17 0608  GLUCAP 164* 130* 134* 139* 137*     Recent Results (from the past 240 hour(s))  C difficile quick scan w PCR reflex     Status: None   Collection Time: 11/13/17 11:17 PM  Result Value Ref Range Status   C Diff antigen NEGATIVE NEGATIVE Final   C Diff toxin NEGATIVE NEGATIVE Final   C Diff interpretation No C. difficile detected.  Final    Comment: VALID Performed at [facility], [address], [place], [place] [address]   MRSA PCR Screening     Status: None   Collection Time: 11/14/17  5:26 AM  Result Value Ref Range Status   MRSA by PCR NEGATIVE NEGATIVE Final     Comment:        The GeneXpert MRSA Assay (FDA approved for NASAL specimens only), is one component of a comprehensive MRSA colonization surveillance program. It is not intended to diagnose MRSA infection nor to guide or monitor treatment for MRSA infections. Performed at [facility], [place] [address], [place], [place] [address]   Culture, blood (single)     Status: None   Collection Time: 11/16/17  5:10 PM  Result Value Ref Range Status   Specimen Description BLOOD CENTRAL LINE  Final   Special Requests   Final    BOTTLES DRAWN AEROBIC AND ANAEROBIC Blood Culture results may not be optimal due to an excessive volume of blood received in culture bottles   Culture   Final    NO GROWTH 5 DAYS Performed at  [facility], [place] [address], [place], [place] [address]    Report Status 11/21/2017 FINAL  Final      Radiology Studies: No results found.   Medications:  Scheduled: . heparin      .  stroke: mapping our early stages of recovery book   Does not apply Once  . sodium chloride   Intravenous Once  . Chlorhexidine Gluconate Cloth  6 each Topical Q0600  . darbepoetin (ARANESP) injection - DIALYSIS  60 mcg Intravenous Q Tue-HD  . feeding supplement (NEPRO CARB STEADY)  237 mL Oral BID BM  . heparin injection (subcutaneous)  5,000 Units Subcutaneous Q8H  . insulin aspart  0-9 Units Subcutaneous TID WC  . levETIRAcetam  1,000 mg Oral Daily  . phenytoin  125 mg Oral BID  . saccharomyces boulardii  250 mg Oral BID  . sodium chloride flush  10-40 mL Intracatheter Q12H  . topiramate  25 mg Oral BID   Continuous: . sodium chloride 500 mL (11/21/17 2323)  . sodium chloride 100 mL/hr at 11/20/17 0934  . sodium chloride    . sodium chloride    . cefTRIAXone (ROCEPHIN)  IV 2 g (11/21/17 2244)  . levETIRAcetam     TFT:DDUKGU chloride, sodium chloride, sodium chloride, acetaminophen **OR** acetaminophen, alteplase, heparin, levETIRAcetam, lidocaine (PF),  lidocaine-prilocaine, loperamide, ondansetron **OR** ondansetron (ZOFRAN) IV, pentafluoroprop-tetrafluoroeth, RESOURCE THICKENUP CLEAR, senna-docusate, sodium chloride flush  Assessment/Plan:    Acute on chronic renal failure--stage unspecified/metabolic acidosis -Etiology likely infection and hemodynamic changes/ATN -Patient initially required sodium bicarbonate infusion.  Bicarbonate levels are low but stable.  She is being dialyzed. -Nephrology was consulted.  Foley catheter patient was placed on 24-hour urine collection was completed.  Patient was dialyzed intermittently.  Foley catheter was subsequently discontinued.   -Non-tunneled HD catheter placed by interventional radiology.   Right knee remains elevated.  Patient being dialyzed today.  Further management per nephrology.     Acute pyelonephritis -11/08/17 urine culture = Ecoli -Patient was started on ceftriaxone on 9/23.  She will complete 2 weeks of treatment on 10/7.  Will discontinue ceftriaxone after that. -11/08/2017 CT renal protocol--bilateral ureteral stents without hydronephrosis. Small bilateral renal calculi.  -ureteral stents initially placed 10/07/17 -Discussed with Dr. Alyson Ingles with urology.  No need for removal of stents during this hospital stay.  This can be pursued in the office.   Seizure disorder/Left Hemiparesis -Seizures appear to be new onset after dialysis.  Patient was started on antiepileptics.  Noted to be on Dilantin and Keppra and Topamax.  No further twitching episodes noted.  Neurology has been following.  MRA head did not show any significant stenosis.  Noted to be on Topamax, phenytoin and Keppra.  Neurology has signed off.  Patient remains stable from a neurological standpoint.  Acute CVA  -MRI significant for watershed infarction at both frontal and parietal varices.  Patient with subtle left-sided weakness.  Stable.  PT and OT evaluation.  CIR versus skilled nursing facility.  Diabetes  mellitus type 2, uncontrolled with hyperglycemia Patient was on Lantus insulin.  She was noted to have episode of hypoglycemia on the morning of 10/4.  Her CBGs had been borderline low and so Lantus discontinued.  HbA1c was 7.0 on 10/3.  Patient's oral intake has improved.  CBGs have been stable.  Continue to hold off on long-acting insulin for now.   Essential hypertension Patient was taken off of amlodipine and metoprolol due to soft blood pressures initially.  Blood pressure has  been reasonably well controlled.  Noted to be somewhat hypertensive this morning while at dialysis.  We will continue to watch for now.  Echocardiogram showed normal systolic function.  Asymmetric septal hypertrophy was noted.  Will need outpatient follow-up with cardiology eventually.  Cannot use ACE inhibitor due to renal failure.  Diarrhea Negative C. difficile.  Imodium as needed.  Severe protein calorie malnutrition Continue nutritional supplements.  Normocytic Anemia -Hemoglobin was around 9 during recent hospital stay in [place], and was around 9 on admission, slowly drifting down.  Patient was transfused 1 unit of PRBC on 10/1.  No evidence for acute blood loss.  Hemoglobin has been stable. -Anemia of chronic blood loss, and chronic illness and kidney disease,.  She has received Aranesp. -Hemoccult positive, this is most likely in the setting diarrhea, as she has Flexi-Seal with green-colored stool in the bag, no significant evidence of GI bleed, continue Protonix 40 twice daily.  No GI bleed noted.  Outpatient work-up.  DVT Prophylaxis: Subcutaneous heparin    Code Status: Full code Family Communication: No family at bedside Disposition Plan: Management as outlined above.  Inpatient rehabilitation was recommended by physical therapy.  Not yet medically ready for discharge.    LOS: 14 days   [facility] Pager [phone] 11/22/2017, 12:16 PM  If 7PM-7AM, please contact  night-coverage at [email or web address], password [email or web address]

## 2017-11-22 NOTE — Progress Notes (Signed)
OT Cancellation Note  Patient Details Name: Erica [name] MRN: [id number] DOB: Jan 31, 1956   Cancelled Treatment:    Reason Eval/Treat Not Completed: Patient at procedure or test/ unavailable--pt in hemodialysis. Golden Circle, OTR/L Acute Rehab Services Pager [phone] Office [phone]     Almon Register 11/22/2017, 9:10 AM

## 2017-11-22 NOTE — Progress Notes (Signed)
Rehab Admissions Coordinator Note:  Patient was screened by Cleatrice Burke for appropriateness for an Inpatient Acute Rehab Consult per PT and OT change in rec from SNF to CIR.  At this time, we are recommending Inpatient Rehab consult. I will contact MD for order.  Danne Baxter, RN, MSN Rehab Admissions Coordinator [phone] 11/22/2017 4:23 PM

## 2017-11-22 NOTE — Procedures (Signed)
RIJV HD cath Tunneled SVC RA EBL 0 Comp 0

## 2017-11-22 NOTE — Progress Notes (Signed)
Pt transported off unit to dialysis. P. Amo Shermon Bozzi RN 

## 2017-11-22 NOTE — Consult Note (Signed)
Chief Complaint: Patient was seen in consultation today for emphysematous pyelonephritis  Referring Physician(s): Dr. Johnney Ou  Supervising Physician: Marybelle Killings  Patient Status: [facility] - In-pt  History of Present Illness: Erica [name] is a 62 y.o. female with history of seizures who presented to [facility] 11/08/17 with abdominal pain and dysuria.  She was recently hospitalized at [facility] for emphysematous pyelonephritis and ATN and underwent ureteral stent placements.  Her creatinine on admission was 4.0.  She underwent temporary dialysis cathter placement 11/17/17 by Dr. Pascal Lux and has received dialysis for the past several days without return of renal function.  Per Nephrology, patient will require dialysis as an outpatient.  Request made for tunneled HD catheter.   Patient has been NPO today.  Last heparin injection was yesterday.    Past Medical History:  Diagnosis Date  . Alcohol-induced pancreatitis   . Chronic diarrhea   . Depression   . Diabetes mellitus    fasting blood sugar 110-120s  . Diastolic CHF ([place])   . DKA (diabetic ketoacidoses) ([place])   . Gastroparesis   . GERD (gastroesophageal reflux disease)   . Heart murmur   . History of kidney stones   . Hyperlipidemia   . Hypertension   . Hypokalemia   . Muscle spasm   . Neuropathic pain   . Neuropathy    Hx: of  . Pyelonephritis   . Recurrent pancreatitis   . Seizures ([place])   . Vitamin B12 deficiency   . Vitamin D deficiency     Past Surgical History:  Procedure Laterality Date  . CATARACT EXTRACTION W/PHACO Right 03/14/2015   Procedure: CATARACT EXTRACTION PHACO AND INTRAOCULAR LENS PLACEMENT (IOC);  Surgeon: Baruch Goldmann, MD;  Location: [facility];  Service: Ophthalmology;  Laterality: Right;  CDE:11.13  . CATARACT EXTRACTION W/PHACO Left 04/11/2015   Procedure: CATARACT EXTRACTION PHACO AND INTRAOCULAR LENS PLACEMENT LEFT EYE CDE=9.68;  Surgeon: Baruch Goldmann, MD;  Location: [facility];  Service:  Ophthalmology;  Laterality: Left;  . COLONOSCOPY  02/24/2010  . cystoscopy with ureteral stent  Bilateral 10/07/2017   At [place] CV LINE RIGHT  11/17/2017  . IR US GUIDE VASC ACCESS RIGHT  11/17/2017  . MULTIPLE EXTRACTIONS WITH ALVEOLOPLASTY N/A 06/13/2012   Procedure: MULTIPLE EXTRACION WITH ALVEOLOPLASTY EXTRACT: 18, 19, 20, 21, 22, 24, 25, 27, 28, 29, 30, 31;  Surgeon: Gae Bon, DDS;  Location: [place];  Service: Oral Surgery;  Laterality: N/A;  . TUBAL LIGATION    . URETERAL STENT PLACEMENT  09/2017    Allergies: Aspirin  Medications: Prior to Admission medications   Medication Sig Start Date End Date Taking? Authorizing Provider  albuterol (PROAIR HFA) 108 (90 BASE) MCG/ACT inhaler Inhale 2 puffs into the lungs every 6 (six) hours as needed for wheezing or shortness of breath.   Yes [provider]  amLODipine (NORVASC) 5 MG tablet Take 5 mg by mouth daily.   Yes [provider]  cholecalciferol (VITAMIN D) 1000 units tablet Take 1,000 Units by mouth daily.   Yes [provider]  esomeprazole (NEXIUM) 40 MG capsule Take 40 mg by mouth daily.    Yes [provider]  hyoscyamine (ANASPAZ) 0.125 MG TBDP disintergrating tablet Take 0.125 mg by mouth 4 (four) times daily -  with meals and at bedtime.    Yes [provider]  insulin aspart (NOVOLOG) 100 UNIT/ML FlexPen Inject 3 Units into the skin 3 (three) times daily with  meals. Patient taking differently: Inject 4-10 Units into the skin 3 (three) times daily with meals. Take 15 minutes before meals 09/20/13  Yes Samuella Cota, MD  Insulin Degludec (TRESIBA FLEXTOUCH) 200 UNIT/ML SOPN Inject 10 Units into the skin daily with breakfast.    Yes [provider]  levETIRAcetam (KEPPRA) 1000 MG tablet Take 1 tablet (1,000 mg total) by mouth 2 (two) times daily. 09/20/13  Yes Samuella Cota, MD  linaclotide [facility]) 145 MCG CAPS capsule Take 145 mcg by  mouth daily before breakfast.   Yes [provider]  magnesium oxide (MAG-OX) 400 MG tablet Take 400 mg by mouth daily.   Yes [provider]  metoCLOPramide (REGLAN) 5 MG tablet Take 5 mg by mouth 4 (four) times daily.   Yes [provider]  metoprolol succinate (TOPROL-XL) 100 MG 24 hr tablet Take 100 mg by mouth daily. Take with or immediately following a meal.   Yes [provider]  mirtazapine (REMERON) 15 MG tablet Take 15 mg by mouth at bedtime.   Yes [provider]  OxyCODONE HCl, Abuse Deter, (OXAYDO) 5 MG TABA Take 5 mg by mouth every 12 (twelve) hours as needed for pain.    Yes [provider]  PARoxetine (PAXIL) 20 MG tablet Take 20 mg by mouth daily.   Yes [provider]  rOPINIRole (REQUIP) 1 MG tablet Take 2 mg by mouth daily. BETWEEN 7 PM AND 8 PM.    Yes [provider]  sodium bicarbonate 650 MG tablet Take 650 mg by mouth 4 (four) times daily.   Yes [provider]     Family History  Problem Relation Age of Onset  . Diabetes Sister   . Chronic Renal Failure Neg Hx     Social History   Socioeconomic History  . Marital status: Legally Separated    Spouse name: Not on file  . Number of children: Not on file  . Years of education: Not on file  . Highest education level: Not on file  Occupational History  . Not on file  Social Needs  . Financial resource strain: Not on file  . Food insecurity:    Worry: Not on file    Inability: Not on file  . Transportation needs:    Medical: Not on file    Non-medical: Not on file  Tobacco Use  . Smoking status: Never Smoker  . Smokeless tobacco: Current User    Types: Snuff  Substance and Sexual Activity  . Alcohol use: Yes    Alcohol/week: 1.0 standard drinks    Types: 1 Cans of beer per week    Comment: "sometimes i drink twice a week"  . Drug use: No  . Sexual activity: Not Currently    Comment: last drink 05/19/12 states 2 drinks 3 x's  a week  Lifestyle  . Physical activity:    Days per week: Not on file    Minutes per session: Not on file  . Stress: Not on file  Relationships  . Social connections:    Talks on phone: Not on file    Gets together: Not on file    Attends religious service: Not on file    Active member of club or organization: Not on file    Attends meetings of clubs or organizations: Not on file    Relationship status: Not on file  Other Topics Concern  . Not on file  Social History Narrative  . Not on  file     Review of Systems: A 12 point ROS discussed and pertinent positives are indicated in the HPI above.  All other systems are negative.  Review of Systems  Constitutional: Negative for fatigue and fever.  Respiratory: Negative for cough and shortness of breath.   Cardiovascular: Negative for chest pain.  Gastrointestinal: Negative for abdominal pain, nausea and vomiting.  Genitourinary: Positive for dysuria.  Musculoskeletal: Negative for back pain.  Psychiatric/Behavioral: Negative for behavioral problems and confusion.    Vital Signs: BP (!) 147/75 (BP Location: Right Arm)   Pulse 92   Temp 98.4 F (36.9 C) (Oral)   Resp 13   Ht 5' (1.524 m)   Wt 123 lb 10.9 oz (56.1 kg)   SpO2 98%   BMI 24.15 kg/m   Physical Exam  Constitutional: She is oriented to person, place, and time. She appears well-developed. No distress.  Neck: Normal range of motion. Neck supple.  Right IJ temp cath in place  Cardiovascular: Normal rate, regular rhythm and normal heart sounds.  Pulmonary/Chest: Effort normal and breath sounds normal. No respiratory distress.  Abdominal: Soft. She exhibits no distension.  Neurological: She is alert and oriented to person, place, and time.  Skin: She is not diaphoretic.  Psychiatric: She has a normal mood and affect. Her behavior is normal. Judgment and thought content normal.  Nursing note and vitals reviewed.    MD Evaluation Airway: WNL Heart:  WNL Abdomen: WNL Chest/ Lungs: WNL ASA  Classification: 3 Mallampati/Airway Score: Two   Imaging: Mr Virgel Paling EQ Contrast  Result Date: 11/17/2017 CLINICAL DATA:  Confusion for 2-3 days. History of hypertension, diabetes and seizures. EXAM: MRA HEAD WITHOUT CONTRAST TECHNIQUE: Angiographic images of the Circle of Willis were obtained using MRA technique without intravenous contrast. COMPARISON:  MRI head November 15, 2017 FINDINGS: Mild motion degraded examination. ANTERIOR CIRCULATION: Normal flow related enhancement of the included cervical, petrous, cavernous and supraclinoid internal carotid arteries. Patent anterior communicating artery. Patent anterior and middle cerebral arteries, mild luminal irregularity. No large vessel occlusion, flow limiting stenosis, aneurysm. POSTERIOR CIRCULATION: RIGHT vertebral artery is dominant. Vertebrobasilar arteries are patent, with normal flow related enhancement of the main branch vessels. Stenotic RIGHT P1 segment with predominant RIGHT vascular supply provided by posterior communicating artery. Small LEFT posterior communicating artery present. Patent posterior cerebral arteries, mild luminal irregularity. No large vessel occlusion, flow limiting stenosis,  aneurysm. ANATOMIC VARIANTS: None. Source images and MIP images were reviewed. IMPRESSION: 1. Motion degraded examination. No emergent large vessel occlusion or flow-limiting stenosis. 2. Mild intracranial atherosclerosis versus motion artifact. Electronically Signed   By: Elon Alas M.D.   On: 11/17/2017 22:24   Mr Brain Wo Contrast  Result Date: 11/15/2017 CLINICAL DATA:  Acute presentation with left-sided weakness and facial droop. EXAM: MRI HEAD WITHOUT CONTRAST TECHNIQUE: Multiplanar, multiecho pulse sequences of the brain and surrounding structures were obtained without intravenous contrast. COMPARISON:  CT 11/14/2017.  MRI 03/01/2008. FINDINGS: Brain: There are bilaterally symmetric  punctate foci restricted diffusion indicating acute infarction in the deep white matter at the frontal parietal vertices. This most likely relates to watershed infarction, though embolic infarction is not completely excluded. No cortical or large vessel territory infarction. No mass lesion, hemorrhage, hydrocephalus or extra-axial collection. There are extensive chronic small-vessel ischemic changes of the pons. No focal cerebellar insult. Cerebral hemispheres elsewhere show scattered foci of small-vessel change within the white matter. No evidence of mass lesion, hemorrhage, hydrocephalus or extra-axial collection. Vascular: Major vessels  at the base of the brain show flow. Skull and upper cervical spine: Negative Sinuses/Orbits: Clear/normal Other: None IMPRESSION: Scattered punctate foci of acute infarction manifest as foci of restricted diffusion within the deep white matter at both frontal parietal vertices. 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 No evidence of cortical or large vessel territory infarction. Atrophy and chronic small-vessel ischemic changes elsewhere as described above, most prominently affecting the pons. Electronically Signed   By: Nelson Chimes M.D.   On: 11/15/2017 16:11   US Renal  Result Date: 11/09/2017 CLINICAL DATA:  62 year old female with chronic kidney disease stage 3. Subsequent encounter. EXAM: RENAL / URINARY TRACT ULTRASOUND COMPLETE COMPARISON:  11/08/2017 CT. FINDINGS: Right Kidney: Length: 10.1 cm. Minimal increased echogenicity. No hydronephrosis or mass. Left Kidney: Length: 10.5 cm. Echogenicity within normal limits. No mass or hydronephrosis visualized. Bladder: Debris within the bladder.  Bilateral ureteral stents in place. IMPRESSION: 1. Bilateral ureteral stents in place.  No hydronephrosis. 2. Debris within the urinary bladder. 3. Slight increased echogenicity right renal parenchyma, may  reflect changes of medical renal disease. Electronically Signed   By: Genia Del M.D.   On: 11/09/2017 14:54   Ir Fluoro Guide Cv Line Right  Result Date: 11/17/2017 INDICATION: End-stage renal disease. Please perform image guided placement of a temporary dialysis catheter for the initiation dialysis. EXAM: NON-TUNNELED CENTRAL VENOUS HEMODIALYSIS CATHETER PLACEMENT WITH ULTRASOUND AND FLUOROSCOPIC GUIDANCE COMPARISON:  None. MEDICATIONS: None FLUOROSCOPY TIME:  18 seconds (2 mGy) COMPLICATIONS: None immediate. PROCEDURE: Informed written consent was obtained from the patient after a discussion of the risks, benefits, and alternatives to treatment. Questions regarding the procedure were encouraged and answered. The right neck and chest were prepped with chlorhexidine in a sterile fashion, and a sterile drape was applied covering the operative field. Maximum barrier sterile technique with sterile gowns and gloves were used for the procedure. A timeout was performed prior to the initiation of the procedure. After the overlying soft tissues were anesthetized, a small venotomy incision was created and a micropuncture kit was utilized to access the internal jugular vein. Real-time ultrasound guidance was utilized for vascular access including the acquisition of a permanent ultrasound image documenting patency of the accessed vessel. The microwire was utilized to measure appropriate catheter length. A stiff glidewire was advanced to the level of the IVC. Under fluoroscopic guidance, the venotomy was serially dilated, ultimately allowing placement of a 20 cm temporary Trialysis catheter with tip ultimately terminating within the superior aspect of the right atrium. Final catheter positioning was confirmed and documented with a spot radiographic image. The catheter aspirates and flushes normally. The catheter was flushed with appropriate volume heparin dwells. The catheter exit site was secured with a 0-Prolene  retention suture. A dressing was placed. The patient tolerated the procedure well without immediate post procedural complication. IMPRESSION: Successful placement of a right internal jugular approach 20 cm temporary dialysis catheter with tip terminating with in the superior aspect of the right atrium. The catheter is ready for immediate use. PLAN: This catheter may be converted to a tunneled dialysis catheter at a later date as indicated. Electronically Signed   By: Sandi Mariscal M.D.   On: 11/17/2017 12:06   Ir US Guide Vasc Access Right  Result Date: 11/17/2017 INDICATION: End-stage renal disease. Please perform image guided placement of a temporary dialysis catheter for the initiation dialysis. EXAM: NON-TUNNELED CENTRAL VENOUS HEMODIALYSIS CATHETER PLACEMENT WITH ULTRASOUND AND FLUOROSCOPIC GUIDANCE COMPARISON:  None. MEDICATIONS: None FLUOROSCOPY TIME:  18 seconds (2 mGy) COMPLICATIONS: None immediate. PROCEDURE: Informed written consent was obtained from the patient after a discussion of the risks, benefits, and alternatives to treatment. Questions regarding the procedure were encouraged and answered. The right neck and chest were prepped with chlorhexidine in a sterile fashion, and a sterile drape was applied covering the operative field. Maximum barrier sterile technique with sterile gowns and gloves were used for the procedure. A timeout was performed prior to the initiation of the procedure. After the overlying soft tissues were anesthetized, a small venotomy incision was created and a micropuncture kit was utilized to access the internal jugular vein. Real-time ultrasound guidance was utilized for vascular access including the acquisition of a permanent ultrasound image documenting patency of the accessed vessel. The microwire was utilized to measure appropriate catheter length. A stiff glidewire was advanced to the level of the IVC. Under fluoroscopic guidance, the venotomy was serially dilated,  ultimately allowing placement of a 20 cm temporary Trialysis catheter with tip ultimately terminating within the superior aspect of the right atrium. Final catheter positioning was confirmed and documented with a spot radiographic image. The catheter aspirates and flushes normally. The catheter was flushed with appropriate volume heparin dwells. The catheter exit site was secured with a 0-Prolene retention suture. A dressing was placed. The patient tolerated the procedure well without immediate post procedural complication. IMPRESSION: Successful placement of a right internal jugular approach 20 cm temporary dialysis catheter with tip terminating with in the superior aspect of the right atrium. The catheter is ready for immediate use. PLAN: This catheter may be converted to a tunneled dialysis catheter at a later date as indicated. Electronically Signed   By: Sandi Mariscal M.D.   On: 11/17/2017 12:06   Dg Chest Port 1 View  Result Date: 11/09/2017 CLINICAL DATA:  PICC line placement EXAM: PORTABLE CHEST 1 VIEW COMPARISON:  10/06/2017 FINDINGS: Left internal jugular PICC line is been placed with the tip at the cavoatrial junction. No pneumothorax. Heart is normal size. Lungs clear. No effusions or acute bony abnormality. IMPRESSION: Left PICC line tip at the cavoatrial junction.  No pneumothorax. Electronically Signed   By: Rolm Baptise M.D.   On: 11/09/2017 12:58   Dg Swallowing Func-speech Pathology  Result Date: 11/19/2017 Objective Swallowing Evaluation: Type of Study: MBS-Modified Barium Swallow Study  Patient Details Name: Erica [name] MRN: [id number] Date of Birth: 07-18-1955 Today's Date: 11/19/2017 Time: SLP Start Time (ACUTE ONLY): 0102 -SLP Stop Time (ACUTE ONLY): 0941 SLP Time Calculation (min) (ACUTE ONLY): 19 min Past Medical History: Past Medical History: Diagnosis Date . Alcohol-induced pancreatitis  . Chronic diarrhea  . Depression  . Diabetes mellitus   fasting blood sugar 110-120s .  Diastolic CHF ([place])  . DKA (diabetic ketoacidoses) ([place])  . Gastroparesis  . GERD (gastroesophageal reflux disease)  . Heart murmur  . History of kidney stones  . Hyperlipidemia  . Hypertension  . Hypokalemia  . Muscle spasm  . Neuropathic pain  . Neuropathy   Hx: of . Pyelonephritis  . Recurrent pancreatitis  . Seizures ([place])  . Vitamin B12 deficiency  . Vitamin D deficiency  Past Surgical History: Past Surgical History: Procedure Laterality Date . CATARACT EXTRACTION W/PHACO Right 03/14/2015  Procedure: CATARACT EXTRACTION PHACO AND INTRAOCULAR LENS PLACEMENT (IOC);  Surgeon: Baruch Goldmann, MD;  Location: [facility];  Service: Ophthalmology;  Laterality: Right;  CDE:11.13 . CATARACT EXTRACTION W/PHACO Left 04/11/2015  Procedure: CATARACT EXTRACTION PHACO AND INTRAOCULAR  LENS PLACEMENT LEFT EYE CDE=9.68;  Surgeon: Baruch Goldmann, MD;  Location: [facility];  Service: Ophthalmology;  Laterality: Left; . COLONOSCOPY  02/24/2010 . cystoscopy with ureteral stent  Bilateral 10/07/2017  At [place] CV LINE RIGHT  11/17/2017 . IR US GUIDE VASC ACCESS RIGHT  11/17/2017 . MULTIPLE EXTRACTIONS WITH ALVEOLOPLASTY N/A 06/13/2012  Procedure: MULTIPLE EXTRACION WITH ALVEOLOPLASTY EXTRACT: 18, 19, 20, 21, 22, 24, 25, 27, 28, 29, 30, 31;  Surgeon: Gae Bon, DDS;  Location: [place];  Service: Oral Surgery;  Laterality: N/A; . TUBAL LIGATION   . URETERAL STENT PLACEMENT  09/2017 HPI: 62 year old female admitted 11/08/17 with weakness. Transfer[facility] to [facility]15/27 due to seizure activity. PMH: DM2, HTN, seizure disorder, alcohol induced pancreatitis, GERD, HLD. MRI = Scattered punctate foci of acute infarction, c/w watershed infarction. MBS recommended to objectively assess the swallowing function and determine LRD secondary to overt coughing on thin liquids and coughing with D2 textures following thin liquids during MBS 11/18/2017.  Subjective: Pt resting in bed. No family present Assessment / Plan / Recommendation CHL  IP CLINICAL IMPRESSIONS 11/19/2017 Clinical Impression Pt presents with oropharyngeal function largely within functional limits with consistent and adequate glottic competence and airway protection noted throughout study. Pt is edentulous and presents with baseline cognitive deficits likely causing noted mildly prolonged bolus manipulation and transit time across consistencies and some lingual pumping. Mildly delayed swallow trigger to the level of the valleculae with solid textures and occasional premature spillage to the pyriforms with mixed consistencies and thin liquids when pressed to take large tablespoon bites of mixed consistencies or consecutive straw sips of thin liquids. Flash penetration of thin liquids reflexively ejected by the end of the swallow on all instances. Pt did not cough during the exam in contrast to presentation at bedside yesterday with the exception of one brief coughing episode with initial tsp trial of mixed consistency that was not visualized; no difficulty was noted with larger additional bites. Secondary to edentulous status recommend continue with dysphagia 2 diet/fine chop and upgrade to thin liquids. ST will follow up X1 for clinical diet check and to review aspiration precautions re: utlize slow rate, small sips and alternate bites and sips prior to signing off. SLP Visit Diagnosis Dysphagia, unspecified (R13.10) Attention and concentration deficit following -- Frontal lobe and executive function deficit following -- Impact on safety and function Mild aspiration risk   CHL IP TREATMENT RECOMMENDATION 11/19/2017 Treatment Recommendations Therapy as outlined in treatment plan below   Prognosis 11/19/2017 Prognosis for Safe Diet Advancement Fair Barriers to Reach Goals Cognitive deficits Barriers/Prognosis Comment -- CHL IP DIET RECOMMENDATION 11/19/2017 SLP Diet Recommendations Dysphagia 2 (Fine chop) solids;Thin liquid Liquid Administration via Straw;Cup Medication Administration  Whole meds with liquid Compensations Minimize environmental distractions;Small sips/bites;Slow rate;Follow solids with liquid Postural Changes Remain semi-upright after after feeds/meals (Comment);Seated upright at 90 degrees   CHL IP OTHER RECOMMENDATIONS 11/19/2017 Recommended Consults -- Oral Care Recommendations Oral care BID Other Recommendations --   CHL IP FOLLOW UP RECOMMENDATIONS 11/19/2017 Follow up Recommendations None   CHL IP FREQUENCY AND DURATION 11/19/2017 Speech Therapy Frequency (ACUTE ONLY) min 1 x/week Treatment Duration 2 weeks      CHL IP ORAL PHASE 11/19/2017 Oral Phase Impaired Oral - Pudding Teaspoon -- Oral - Pudding Cup -- Oral - Honey Teaspoon -- Oral - Honey Cup -- Oral - Nectar Teaspoon -- Oral - Nectar Cup -- Oral - Nectar Straw -- Oral - Thin  Teaspoon WFL Oral - Thin Cup [facility] Oral - Thin Straw Lingual pumping;Premature spillage Oral - Puree Lingual pumping;Reduced posterior propulsion Oral - Mech Soft Lingual pumping;Reduced posterior propulsion Oral - Regular -- Oral - Multi-Consistency -- Oral - Pill WFL Oral Phase - Comment --  CHL IP PHARYNGEAL PHASE 11/19/2017 Pharyngeal Phase -- Pharyngeal- Pudding Teaspoon -- Pharyngeal -- Pharyngeal- Pudding Cup -- Pharyngeal -- Pharyngeal- Honey Teaspoon -- Pharyngeal -- Pharyngeal- Honey Cup -- Pharyngeal -- Pharyngeal- Nectar Teaspoon -- Pharyngeal -- Pharyngeal- Nectar Cup -- Pharyngeal -- Pharyngeal- Nectar Straw -- Pharyngeal -- Pharyngeal- Thin Teaspoon -- Pharyngeal -- Pharyngeal- Thin Cup -- Pharyngeal -- Pharyngeal- Thin Straw Delayed swallow initiation-pyriform sinuses;Penetration/Aspiration before swallow Pharyngeal Material enters airway, remains ABOVE vocal cords then ejected out Pharyngeal- Puree WFL Pharyngeal -- Pharyngeal- Mechanical Soft -- Pharyngeal -- Pharyngeal- Regular WFL Pharyngeal -- Pharyngeal- Multi-consistency -- Pharyngeal -- Pharyngeal- Pill WFL Pharyngeal -- Pharyngeal Comment --  No flowsheet data found. Amelia H.  Roddie Mc, CCC-SLP Speech Language Pathologist Wende Bushy 11/19/2017, 11:31 AM              Ct Renal Stone Study  Result Date: 11/08/2017 CLINICAL DATA:  62 year old female with UTI. History of kidney stones. Displacement of indwelling urethral stent. EXAM: CT ABDOMEN AND PELVIS WITHOUT CONTRAST TECHNIQUE: Multidetector CT imaging of the abdomen and pelvis was performed following the standard protocol without IV contrast. COMPARISON:  CT of the abdomen pelvis dated 12/07/2012 FINDINGS: Evaluation of this exam is limited in the absence of intravenous contrast. Lower chest: Bibasilar linear atelectasis/scarring. There is calcification of the mitral annulus. There is hypoattenuation of the cardiac blood pool suggestive of a degree of anemia. Clinical correlation is recommended. No intra-abdominal free air. Small ascites and diffuse mesenteric edema. Hepatobiliary: The liver is unremarkable. There is mild intrahepatic biliary ductal dilatation as seen on the prior CT. The gallbladder is not visualized, likely surgically absent. Pancreas: The pancreas is atrophic with coarse calcifications likely sequela of chronic pancreatitis. Spleen: Normal in size without focal abnormality. Adrenals/Urinary Tract: The adrenal glands are unremarkable. Small faint hypodensities within the renal collecting systems on likely represent small nonobstructing calculi versus residual excreted contrast from recent IV administration. Clinical correlation is recommended. Bilateral pigtail ureteral stents noted. The proximal tip of the right ureteral stent is in the right renal upper pole collecting system and the proximal tip of the left ureteral stent is in the left renal pelvis. The distal ends of the stents are within the urinary bladder. There is no hydronephrosis on either side. Stomach/Bowel: There is diffuse thickened appearance of the wall of the stomach as well as thickened appearance of the colon, likely related to  ascites. Gastritis or colitis are considered less likely. Clinical correlation is recommended. There is no bowel obstruction. The appendix is normal. Vascular/Lymphatic: The abdominal aorta and IVC are grossly unremarkable on this noncontrast CT. No portal venous gas. Top-normal left retroperitoneal lymph nodes. Reproductive: Vascular calcification of the peripheral uterus noted. No pelvic mass. Other: Diffuse subcutaneous edema and anasarca. Musculoskeletal: There is degenerative changes of the spine. Grade 1 L4-L5 anterolisthesis. There is compression fracture of the superior endplate of the L2, age indeterminate but new since the prior CT of 2014. Compression fracture of the inferior endplate of Z61 and W96. The T12 compression fracture is new since the prior CT. There is no retropulsed fragment. IMPRESSION: 1. Bilateral ureteral stents in place as described. No hydronephrosis. 2. Probable small bilateral renal calculi versus residual contrast from recent  study. 3. Small ascites, diffuse edema, and anasarca of indeterminate etiology. Clinical correlation is recommended. 4. Thickened stomach and colon, likely related to ascites. Electronically Signed   By: Anner Crete M.D.   On: 11/08/2017 21:41   Ct Head Code Stroke Wo Contrast  Result Date: 11/14/2017 CLINICAL DATA:  Code stroke. LEFT-sided weakness and facial droop with drooling. EXAM: CT HEAD WITHOUT CONTRAST TECHNIQUE: Contiguous axial images were obtained from the base of the skull through the vertex without intravenous contrast. COMPARISON:  09/11/2013. FINDINGS: Brain: No evidence for acute infarction, hemorrhage, mass lesion, hydrocephalus, or extra-axial fluid. Generalized atrophy, premature for age. Hypoattenuation of white matter consistent with small vessel disease. Vascular: Calcification of the cavernous internal carotid arteries consistent with cerebrovascular atherosclerotic disease. No signs of intracranial large vessel occlusion. Skull:  Calvarium intact. Sinuses/Orbits: No sinus disease.  Negative orbits. Other: None. ASPECTS [facility] Stroke Program Early CT Score) - Ganglionic level infarction (caudate, lentiform nuclei, internal capsule, insula, M1-M3 cortex): 7 - Supraganglionic infarction (M4-M6 cortex): 3 Total score (0-10 with 10 being normal): 10 IMPRESSION: 1. Atrophy and small vessel disease, mildly progressed from 2015. No acute intracranial findings. No signs of large vessel occlusion. 2. ASPECTS is 10. 3. A text page was placed to the ordering provider at 11:17 a.m. Electronically Signed   By: Staci Righter M.D.   On: 11/14/2017 11:23    Labs:  CBC: Recent Labs    11/16/17 0514 11/18/17 0434 11/20/17 0420 11/22/17 0500  WBC 14.9* 13.5* 10.5 11.1*  HGB 7.2* 8.6* 8.2* 7.7*  HCT 23.7* 28.7* 28.0* 26.2*  PLT 259 188 160 210    COAGS: Recent Labs    11/14/17 1045 11/22/17 0743  INR 1.47 1.41  APTT 38*  --     BMP: Recent Labs    11/18/17 0434 11/20/17 0420 11/21/17 0735 11/22/17 0500  NA 141 140 136 138  K 3.2* 4.5 4.7 5.0  CL 109 113* 112* 114*  CO2 26 20* 17* 16*  GLUCOSE 74 167* 163* 148*  BUN _0 CALCIUM 7.5* 7.8* 7.8* 7.8*  CREATININE 3.93* 3.27* 3.95* 4.44*  GFRNONAA 11* 14* 11* 10*  GFRAA 13* 16* 13* 11*    LIVER FUNCTION TESTS: Recent Labs    11/08/17 1900  11/15/17 0807 11/15/17 0924 11/16/17 0514 11/21/17 0735  BILITOT 0.5  --   --  0.9  --  0.2*  AST 12*  --   --  24  --  29  ALT 8  --   --  12  --  12  ALKPHOS 90  --   --  69  --  93  PROT 7.8  --   --  5.8*  --  5.7*  ALBUMIN 2.4*   < > 1.9* 1.9* 1.8* 1.8*   < > = values in this interval not displayed.    TUMOR MARKERS: No results for input(s): AFPTM, CEA, CA199, CHROMGRNA in the last 8760 hours.  Assessment and Plan: Emphysematous pyelonephritis,  Chronic renal insufficiency Patient with temp cath placed 10/2 has undergone dialysis for the past several days.  No change to renal function.  IR  consulted for tunneled dialysis catheter placement.  She has been NPO today.  Last hep injection was 10/6.  Risks and benefits discussed with the patient including, but not limited to bleeding, infection, vascular injury, pneumothorax which may require chest tube placement, air embolism or even death  All of the patient's questions were answered, patient is  agreeable to proceed. Consent signed and in chart.   Thank you for this interesting consult.  I greatly enjoyed meeting Erica [name] and look forward to participating in their care.  A copy of this report was sent to the requesting provider on this date.  Electronically Signed: Docia Barrier, PA 11/22/2017, 3:11 PM   I spent a total of 20 Minutes    in face to face in clinical consultation, greater than 50% of which was counseling/coordinating care for chronic renal insufficiency.

## 2017-11-22 NOTE — Progress Notes (Signed)
Pt back to room on unit from dialysis. Pt alert and verbally responsive. VSS: telemetry remains on. Will continue to closely monitor. Delia Heady RN

## 2017-11-22 NOTE — Progress Notes (Signed)
Pt off unit to IR for procedure. Delia Heady RN

## 2017-11-23 ENCOUNTER — Encounter (HOSPITAL_COMMUNITY): Payer: Self-pay | Admitting: Interventional Radiology

## 2017-11-23 DIAGNOSIS — I5032 Chronic diastolic (congestive) heart failure: Secondary | ICD-10-CM

## 2017-11-23 DIAGNOSIS — I10 Essential (primary) hypertension: Secondary | ICD-10-CM

## 2017-11-23 LAB — GLUCOSE, CAPILLARY
GLUCOSE-CAPILLARY: 103 mg/dL — AB (ref 70–99)
GLUCOSE-CAPILLARY: 218 mg/dL — AB (ref 70–99)
Glucose-Capillary: 178 mg/dL — ABNORMAL HIGH (ref 70–99)
Glucose-Capillary: 170 mg/dL — ABNORMAL HIGH (ref 70–99)

## 2017-11-23 NOTE — Progress Notes (Signed)
[facility] Progress Note   Subjective:      Patient feels well this morning, no complaints  HD yesterday 1L UF tol well  S/p R IJ Tunneled HD cath 10/7 IR, temp cath was converted  CIR consult in process  No sig UOP  Objective Vitals:   11/22/17 2100 11/22/17 2347 11/23/17 0337 11/23/17 0818  BP: 135/66 133/72 131/79 125/72  Pulse: 99 91 100 95  Resp: 13 18 16 17   Temp:  98.7 F (37.1 C) 98.9 F (37.2 C) 98.9 F (37.2 C)  TempSrc:  Oral Oral Oral  SpO2: 97% 99% 98% 99%  Weight:   56 kg   Height:       Physical Exam General: lying comfortably Heart: RRR, no rub Lungs: normal WOB, clear anteriorly Abdomen: soft, nontender Extremities: no LE edema GU: +foley draining yellow urine Dialysis Access:  RIJ tunneled HD cath c/d/i  Additional Objective Labs: Basic Metabolic Panel: Recent Labs  Lab 11/20/17 0420 11/21/17 0735 11/22/17 0500  NA 140 136 138  K 4.5 4.7 5.0  CL 113* 112* 114*  CO2 20* 17* 16*  GLUCOSE 167* 163* 148*  BUN 8 14 18   CREATININE 3.27* 3.95* 4.44*  CALCIUM 7.8* 7.8* 7.8*   Liver Function Tests: Recent Labs  Lab 11/21/17 0735  AST 29  ALT 12  ALKPHOS 93  BILITOT 0.2*  PROT 5.7*  ALBUMIN 1.8*   No results for input(s): LIPASE, AMYLASE in the last 168 hours. CBC: Recent Labs  Lab 11/18/17 0434 11/20/17 0420 11/22/17 0500  WBC 13.5* 10.5 11.1*  HGB 8.6* 8.2* 7.7*  HCT 28.7* 28.0* 26.2*  MCV 89.1 93.0 94.9  PLT 188 160 210   Blood Culture    Component Value Date/Time   SDES BLOOD CENTRAL LINE 11/16/2017 1710   SPECREQUEST  11/16/2017 1710    BOTTLES DRAWN AEROBIC AND ANAEROBIC Blood Culture results may not be optimal due to an excessive volume of blood received in culture bottles   CULT  11/16/2017 1710    NO GROWTH 5 DAYS Performed at [facility] [address] [place], [place] [address]    REPTSTATUS 11/21/2017 FINAL 11/16/2017 1710    Cardiac Enzymes: No results for input(s): CKTOTAL,  CKMB, CKMBINDEX, TROPONINI in the last 168 hours. CBG: Recent Labs  Lab 11/22/17 0608 11/22/17 1318 11/22/17 1719 11/22/17 2131 11/23/17 0606  GLUCAP 137* 79 92 156* 178*   Iron Studies:  No results for input(s): IRON, TIBC, TRANSFERRIN, FERRITIN in the last 72 hours. @lablastinr3 @ Studies/Results: Ir Fluoro Guide Cv Line Right  Result Date: 11/23/2017 INDICATION: Right jugular temporary dialysis catheter in place. Permanent dialysis catheter placement requested. EXAM: RIGHT HEMODIALYSIS CATHETER TEMPORARY TO TUNNELED CONVERSION MEDICATIONS: Ancef 2 g; The antibiotic was administered within an appropriate time interval prior to skin puncture. ANESTHESIA/SEDATION: Moderate (conscious) sedation was employed during this procedure. A total of Versed 1 mg and Fentanyl 50 mcg was administered intravenously. Moderate Sedation Time: 11 minutes. The patient's level of consciousness and vital signs were monitored continuously by radiology nursing throughout the procedure under my direct supervision. FLUOROSCOPY TIME:  Fluoroscopy Time:  minutes 36 seconds (1 mGy). COMPLICATIONS: None immediate. PROCEDURE: Informed written consent was obtained from the patient after a thorough discussion of the procedural risks, benefits and alternatives. All questions were addressed. Maximal Sterile Barrier Technique was utilized including caps, mask, sterile gowns, sterile gloves, sterile drape, hand hygiene and skin antiseptic. A timeout was performed prior to the initiation of the  procedure. The right neck was prepped and draped in a sterile fashion. 1% lidocaine was utilized for local anesthesia. The temporary dialysis catheter was removed over an Amplatz wire. An incision was made in the upper chest. A 19 cm tip to cuff dialysis catheter was advanced from the chest incision out the neck entry site. A peel-away sheath was advanced over the Amplatz wire. The leading edge of the dialysis catheter was advanced through the  peel-away sheath. The peel-away sheath was removed. The neck incision was closed with a 4 0 Vicryl subcuticular stitch. The chest incision was closed with an 0 Prolene pursestring. It was flushed and instilled with heparinized saline. FINDINGS: Tip of the tunneled dialysis catheter is at the cavoatrial junction. IMPRESSION: Successful right jugular temporary to tunneled dialysis catheter conversion. Electronically Signed   By: Marybelle Killings M.D.   On: 11/23/2017 07:11   Medications: . sodium chloride 500 mL (11/21/17 2323)  . sodium chloride 100 mL/hr at 11/20/17 0934  . sodium chloride    . sodium chloride    . levETIRAcetam     .  stroke: mapping our early stages of recovery book   Does not apply Once  . sodium chloride   Intravenous Once  . bacitracin   Topical BID  . Chlorhexidine Gluconate Cloth  6 each Topical Q0600  . darbepoetin (ARANESP) injection - DIALYSIS  60 mcg Intravenous Q Tue-HD  . feeding supplement (NEPRO CARB STEADY)  237 mL Oral BID BM  . heparin injection (subcutaneous)  5,000 Units Subcutaneous Q8H  . insulin aspart  0-9 Units Subcutaneous TID WC  . levETIRAcetam  1,000 mg Oral Daily  . phenytoin  125 mg Oral BID  . saccharomyces boulardii  250 mg Oral BID  . sodium chloride flush  10-40 mL Intracatheter Q12H  . topiramate  25 mg Oral BID     Assessment/Plan:  1.  AKI on CKD:  Recent course with emphysematous pyelonephritis treated at [facility], urologic intervention double J stents placed.  During that admission Cr ~ 8 on presentation, down to 4 on discharge.  Last known Cr 0.8 in 2017.  Represented 2 weeks later (10/2017) with Cr up to 11.   She was initially hydrated but due lack of improved had HD x 2 at [facility] prior to transfer here on 9/29. She has required ongoing HD here.   Recent 24-hour creatinine clearance and consistent with recovering GFR.  Remains without any evidence of GFR recovery.  Currently continuing 3 times weekly hemodialysis.  CLIP in process.   Will need to consider placement of permanent access.  2.   HTN:   Reasonable control for now.   3.  Anemia:  Hb has trended down significantly from 10.1 on 9/23 to 7.2 and she rec'd transfusion 10/1.  Stable today at 8.2 10/5.  Iron indices 9/30 show iron replete.  Hemoccult + and started on PPI but no signs of gross bleeding.  Given aranesp 67 with HD 10/1.    4.  BMM: stable  5. Seizures:  H/o hypoglycemia induced seizures; neurology following here and dosing meds for dialysis at this time.  She appears to have improved.    6.  E coli UTI: Completed ceftriaxone.  Has ureteral stents in place from prior hospitalization.  We will follow-up with urology for stent removal.  7.  DM: per primary.   [name]  11/23/2017, 9:11 AM  [employer]

## 2017-11-23 NOTE — Progress Notes (Signed)
Nutrition Follow-up  DOCUMENTATION CODES:   Severe malnutrition in context of chronic illness  INTERVENTION:  Continue Nepro Shake po BID, each supplement provides 425 kcal and 19 grams protein.  Encourage adequate PO intake.   NUTRITION DIAGNOSIS:   Severe Malnutrition related to chronic illness(declining renal fuction -Cr.10.8, BUN 80. Pending diaylsis) as evidenced by energy intake < or equal to 75% for > or equal to 1 month, moderate muscle depletion, severe muscle depletion, moderate fat depletion, per patient/family report; ongoing  GOAL:   Patient will meet greater than or equal to 90% of their needs; progressing  MONITOR:   PO intake, Supplement acceptance, Labs, Weight trends, I & O's  REASON FOR ASSESSMENT:   Malnutrition Screening Tool    ASSESSMENT:   Patient is a pleasant 62 yo female with hx of DM, HTN, Vitamin D deficiency, CHF and GERD. She presents with acute on chronic renal failure and urinary infection. Appetite is poor which is not surprising given her compromised renal function.  S/p R IJ Tunneled HD cath 10/7 IR, temp cath was converted  Meal completion has been varied from 35-100%. Pt currently has Nepro shake ordered and has been consuming them. RD to continue with current orders to aid in caloric and protein needs. Labs and medications reviewed.   Diet Order:   Diet Order            Diet renal with fluid restriction Fluid restriction: 1200 mL Fluid; Room service appropriate? Yes; Fluid consistency: Thin  Diet effective now              EDUCATION NEEDS:   Education needs have been addressed  Skin:  Skin Assessment: Reviewed RN Assessment  Last BM:  10/8  Height:   Ht Readings from Last 1 Encounters:  11/08/17 5' (1.524 m)    Weight:   Wt Readings from Last 1 Encounters:  11/23/17 56 kg    Ideal Body Weight:  45 kg  BMI:  Body mass index is 24.11 kg/m.  Estimated Nutritional Needs:   Kcal:  1650-1850  Protein:  75-85  grams  Fluid:  1.2 L/day    Corrin Parker, MS, RD, LDN Pager # [phone] After hours/ weekend pager # [phone]

## 2017-11-23 NOTE — Progress Notes (Signed)
[facility] PROGRESS NOTE  Erica [name] GYJ:[id number] DOB: Apr 05, 1955 DOA: 11/08/2017  PCP: Octavio Graves, DO  Brief History/Interval Summary: 62 year old African-American female with past medical history of diabetes mellitus type 2, essential hypertension, history of seizure disorder presented with 2 to 3 days of confusion. The patient was also complaining of lower abd pain with dysuria.The patient was recently hospitalized at [facility] from 10/07/2017 through 10/21/2017 during which she was treated for emphysematous pyelonephritis and acute on chronic renal failure. She also was treated for DKA and fluid overload.She was noted to have hemoglobin A1c of 12.1. She required transfusion 2 u PRBC without evidence of active bleeding.She underwent bilateral ureteral stenting on 10/07/2017. She was treated appropriately with antibiotics. She was discharged with instructions for outpatient cystoscopy and removal of stents in 2 to 3 weeks. At the time of discharge, her serum creatinine has stabilized between 3.7-3.9.Upon thisadmission, the patient has serum creatinine of 8.1. Nephrology was consulted to assist with management. Patient had seizures 11/14/2017, she was transferred to [facility] for further work-up. MRI brain significant for scattered infarcts. Seizures has been managed by neurology. Hemodialysis has been continued during this hospital stay.    Consultants: Nephrology.  Urology.  Neurology.  Procedures:   Transthoracic echocardiogram Study Conclusions  - Left ventricle: The cavity size was normal. Moderate focal basal   septal hypertrophy. Mild hypertrophy of the rest of the   ventricle. No LV outflow tract gradient noted. Systolic function   was normal. The estimated ejection fraction was in the range of   60% to 65%. Wall motion was normal; there were no regional wall   motion abnormalities. Doppler parameters are consistent with   abnormal  left ventricular relaxation (grade 1 diastolic   dysfunction). - Aortic valve: There was no stenosis. There was trivial   regurgitation. - Mitral valve: There was chordal systolic anterior motion but not   valvular SAM. Moderately calcified annulus. There was trivial   regurgitation. - Right ventricle: The cavity size was normal. Systolic function   was normal. - Right atrium: Catheter noted in RA. - Tricuspid valve: Peak RV-RA gradient (S): 47 mm Hg. - Pulmonary arteries: PA peak pressure: 50 mm Hg (S). - Inferior vena cava: The vessel was normal in size. The   respirophasic diameter changes were in the normal range (>= 50%),   consistent with normal central venous pressure.  Impressions:  - Normal LV size with moderate asymmetric basal septal hypertrophy,   mild hypertrophy of the rest of the ventricle. No LVOT gradient.   There was chordal but not mitral valve SAM. Normal RV size and   systolic function. No significant valvular abnormalities.   Differential is LVH due to HTN versus a variant of hypertrophic   cardiomyopathy.  Antibiotics: Ceftriaxone 9/23-10/7  Subjective/Interval History: Patient denies any complaints.  Feels well.  Specifically no shortness of breath.  Objective:  Vital Signs  Vitals:   11/22/17 2100 11/22/17 2347 11/23/17 0337 11/23/17 0818  BP: 135/66 133/72 131/79 125/72  Pulse: 99 91 100 95  Resp: 13 18 16 17   Temp:  98.7 F (37.1 C) 98.9 F (37.2 C) 98.9 F (37.2 C)  TempSrc:  Oral Oral Oral  SpO2: 97% 99% 98% 99%  Weight:   56 kg   Height:        Intake/Output Summary (Last 24 hours) at 11/23/2017 1305 Last data filed at 11/22/2017 1400 Gross per 24 hour  Intake 0 ml  Output -  Net 0 ml   Filed Weights   11/22/17 0815 11/22/17 1145 11/23/17 0337  Weight: 57 kg 56.1 kg 56 kg    General appearance: Awake alert.  In no distress. Resp: Normal effort.  Clear to auscultation bilaterally. Cardio: S1-S2 is normal regular.  No S3-S4.   No rubs murmurs or bruit GI: Abdomen remains soft.  Nontender nondistended.  Bowel sounds are present normal.  No masses organomegaly. Extremities: No pedal edema Neurologic: Subtle left-sided weakness as before.  Remains awake alert.  Lab Results:  Data Reviewed: I have personally reviewed following labs and imaging studies  CBC: Recent Labs  Lab 11/18/17 0434 11/20/17 0420 11/22/17 0500  WBC 13.5* 10.5 11.1*  HGB 8.6* 8.2* 7.7*  HCT 28.7* 28.0* 26.2*  MCV 89.1 93.0 94.9  PLT 188 160 109    Basic Metabolic Panel: Recent Labs  Lab 11/17/17 2245 11/18/17 0434 11/20/17 0420 11/21/17 0735 11/22/17 0500  NA 140 141 140 136 138  K 3.4* 3.2* 4.5 4.7 5.0  CL 107 109 113* 112* 114*  CO2 23 26 20* 17* 16*  GLUCOSE 107* 74 167* 163* 148*  BUN 14 14 8 14 18   CREATININE 3.71* 3.93* 3.27* 3.95* 4.44*  CALCIUM 7.6* 7.5* 7.8* 7.8* 7.8*    GFR: Estimated Creatinine Clearance: 10.2 mL/min (A) (by C-G formula based on SCr of 4.44 mg/dL (H)).  Liver Function Tests: Recent Labs  Lab 11/21/17 0735  AST 29  ALT 12  ALKPHOS 93  BILITOT 0.2*  PROT 5.7*  ALBUMIN 1.8*    CBG: Recent Labs  Lab 11/22/17 1318 11/22/17 1719 11/22/17 2131 11/23/17 0606 11/23/17 1122  GLUCAP 79 92 156* 178* 103*     Recent Results (from the past 240 hour(s))  C difficile quick scan w PCR reflex     Status: None   Collection Time: 11/13/17 11:17 PM  Result Value Ref Range Status   C Diff antigen NEGATIVE NEGATIVE Final   C Diff toxin NEGATIVE NEGATIVE Final   C Diff interpretation No C. difficile detected.  Final    Comment: VALID Performed at [facility], [address] [place], [place] [address]   MRSA PCR Screening     Status: None   Collection Time: 11/14/17  5:26 AM  Result Value Ref Range Status   MRSA by PCR NEGATIVE NEGATIVE Final    Comment:        The GeneXpert MRSA Assay (FDA approved for NASAL specimens only), is one component of a comprehensive MRSA  colonization surveillance program. It is not intended to diagnose MRSA infection nor to guide or monitor treatment for MRSA infections. Performed at [facility], [place] [address], [place], [place] [address]   Culture, blood (single)     Status: None   Collection Time: 11/16/17  5:10 PM  Result Value Ref Range Status   Specimen Description BLOOD CENTRAL LINE  Final   Special Requests   Final    BOTTLES DRAWN AEROBIC AND ANAEROBIC Blood Culture results may not be optimal due to an excessive volume of blood received in culture bottles   Culture   Final    NO GROWTH 5 DAYS Performed at [facility], [place] [address] [place], [place] [address]    Report Status 11/21/2017 FINAL  Final      Radiology Studies: Ir Fluoro Guide Cv Line Right  Result Date: 11/23/2017 INDICATION: Right jugular temporary dialysis catheter in place. Permanent dialysis catheter placement requested. EXAM: RIGHT HEMODIALYSIS  CATHETER TEMPORARY TO TUNNELED CONVERSION MEDICATIONS: Ancef 2 g; The antibiotic was administered within an appropriate time interval prior to skin puncture. ANESTHESIA/SEDATION: Moderate (conscious) sedation was employed during this procedure. A total of Versed 1 mg and Fentanyl 50 mcg was administered intravenously. Moderate Sedation Time: 11 minutes. The patient's level of consciousness and vital signs were monitored continuously by radiology nursing throughout the procedure under my direct supervision. FLUOROSCOPY TIME:  Fluoroscopy Time:  minutes 36 seconds (1 mGy). COMPLICATIONS: None immediate. PROCEDURE: Informed written consent was obtained from the patient after a thorough discussion of the procedural risks, benefits and alternatives. All questions were addressed. Maximal Sterile Barrier Technique was utilized including caps, mask, sterile gowns, sterile gloves, sterile drape, hand hygiene and skin antiseptic. A timeout was performed prior to the initiation of the procedure. The right  neck was prepped and draped in a sterile fashion. 1% lidocaine was utilized for local anesthesia. The temporary dialysis catheter was removed over an Amplatz wire. An incision was made in the upper chest. A 19 cm tip to cuff dialysis catheter was advanced from the chest incision out the neck entry site. A peel-away sheath was advanced over the Amplatz wire. The leading edge of the dialysis catheter was advanced through the peel-away sheath. The peel-away sheath was removed. The neck incision was closed with a 4 0 Vicryl subcuticular stitch. The chest incision was closed with an 0 Prolene pursestring. It was flushed and instilled with heparinized saline. FINDINGS: Tip of the tunneled dialysis catheter is at the cavoatrial junction. IMPRESSION: Successful right jugular temporary to tunneled dialysis catheter conversion. Electronically Signed   By: Marybelle Killings M.D.   On: 11/23/2017 07:11     Medications:  Scheduled: .  stroke: mapping our early stages of recovery book   Does not apply Once  . sodium chloride   Intravenous Once  . bacitracin   Topical BID  . Chlorhexidine Gluconate Cloth  6 each Topical Q0600  . darbepoetin (ARANESP) injection - DIALYSIS  60 mcg Intravenous Q Tue-HD  . feeding supplement (NEPRO CARB STEADY)  237 mL Oral BID BM  . heparin injection (subcutaneous)  5,000 Units Subcutaneous Q8H  . insulin aspart  0-9 Units Subcutaneous TID WC  . levETIRAcetam  1,000 mg Oral Daily  . phenytoin  125 mg Oral BID  . saccharomyces boulardii  250 mg Oral BID  . sodium chloride flush  10-40 mL Intracatheter Q12H   Continuous: . sodium chloride 500 mL (11/21/17 2323)  . sodium chloride 100 mL/hr at 11/20/17 0934  . sodium chloride    . sodium chloride    . levETIRAcetam     OMV:EHMCNO chloride, sodium chloride, sodium chloride, acetaminophen **OR** acetaminophen, alteplase, heparin, levETIRAcetam, lidocaine (PF), lidocaine-prilocaine, loperamide, ondansetron **OR** ondansetron (ZOFRAN)  IV, pentafluoroprop-tetrafluoroeth, RESOURCE THICKENUP CLEAR, senna-docusate, sodium chloride flush  Assessment/Plan:    Acute on chronic renal failure--stage unspecified/metabolic acidosis -Etiology likely infection and hemodynamic changes/ATN -Patient initially required sodium bicarbonate infusion.  Bicarbonate levels are low but stable.  She is being dialyzed. -Nephrology was consulted.  Foley catheter patient was placed on 24-hour urine collection was completed.  Patient was dialyzed intermittently.  Foley catheter was subsequently discontinued.   -Dialysis catheter placed by interventional radiology. Patient was dialyzed yesterday.  Further plans per nephrology.  She is thought to require dialysis from here onwards.  The process for establishing her for outpatient dialysis is in progress.    Acute pyelonephritis -11/08/17 urine culture = Ecoli -Patient has completed 2  weeks of ceftriaxone on 10/7.   -11/08/2017 CT renal protocol--bilateral ureteral stents without hydronephrosis. Small bilateral renal calculi.  -Ureteral stents initially placed 10/07/17 -Discussed with Dr. Alyson Ingles with urology.  No need for removal of stents during this hospital stay.  This can be pursued in the office.   Seizure disorder/Left Hemiparesis -Seizures appear to be new onset after dialysis.  Patient was started on antiepileptics.  MRA head did not show any significant stenosis.  Noted to be on Topamax, phenytoin and Keppra.  Neurology has signed off.  Patient remains stable from a neurological standpoint.  Acute CVA  -MRI significant for watershed infarction at both frontal and parietal varices.  Patient with subtle left-sided weakness.  Stable.  PT and OT evaluation.  CIR versus skilled nursing facility.  Diabetes mellitus type 2, uncontrolled with hyperglycemia Patient was on Lantus insulin.  She was noted to have episode of hypoglycemia on the morning of 10/4.  Her CBGs had been borderline low and  so Lantus discontinued.  HbA1c was 7.0 on 10/3.  Patient's oral intake has improved.  CBGs have varied from 79-178.  Continue to watch for now.  Continue keep her off of long-acting insulin.     Essential hypertension Patient was taken off of amlodipine and metoprolol due to soft blood pressures initially.  Blood pressure has been reasonably well controlled. Echocardiogram showed normal systolic function.  Asymmetric septal hypertrophy was noted.  Will need outpatient follow-up with cardiology eventually.  Cannot use ACE inhibitor due to renal failure.  Diarrhea C. difficile is negative.  Diarrhea appears to have slowed down.  Imodium as needed.  Severe protein calorie malnutrition Continue nutritional supplements.  Normocytic Anemia -Hemoglobin was around 9 during recent hospital stay in [place], and was around 9 on admission, slowly drifting down.  Patient was transfused 1 unit of PRBC on 10/1.  No evidence for acute blood loss.  Hemoglobin has been stable. -Anemia of chronic blood loss, and chronic illness and kidney disease,.  She has received Aranesp. -Hemoccult positive, this is most likely in the setting diarrhea, as she has Flexi-Seal with green-colored stool in the bag, no significant evidence of GI bleed, continue Protonix 40 twice daily.  No GI bleed noted.  Outpatient work-up.  DVT Prophylaxis: Subcutaneous heparin    Code Status: Full code Family Communication: No family at bedside Disposition Plan: Has been as outlined above.  Inpatient rehabilitation is evaluating patient.     LOS: 15 days   [facility] Pager [phone] 11/23/2017, 1:05 PM  If 7PM-7AM, please contact night-coverage at [email or web address], password [facility]

## 2017-11-23 NOTE — Progress Notes (Signed)
Inpatient Rehabilitation Admissions Coordinator  Patient sleeping soundly. I contacted her son, [name], by phone. I discussed goals and expectations of an inpt rehab admit. He prefers inpt rehab rather than SNF. He and his sister can provide  caregiver support at home. She will be new to hemodialysis at d/c. I will follow her progress with planned admit to CIR when medically ready for d/c.  Danne Baxter, RN, MSN Rehab Admissions Coordinator [phone] 11/23/2017 3:57 PM

## 2017-11-23 NOTE — Progress Notes (Signed)
Physical Therapy Treatment Patient Details Name: Erica [name] MRN: [id number] DOB: May 25, 1955 Today's Date: 11/23/2017    History of Present Illness Pt is a 62 y/o female presents with Lft facial, Lft UE and Lft LE twitching at dialysis and admitted with a breakthrough seizure. 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 s/pRIJ HD cath placed 10/7. PMH includes DM with history of DKA, HTN, HLD, CKD, cardiac murmur, chronic diarrhea, nephrolithiasis with history of pyelonephritis, gastroparesis with GERD and a history of recurrent alcohol-associated pancreatitis, and a history of major depression disorder.     PT Comments    Patient progressing well towards PT goals. Tolerated gait training with use of RW and Min A for safety. Having BUE support with RW seemed to help with ataxia. Pt very slow and demonstrates steppage gait through LLE to advance it. Mobility seems to be improving daily. Motivated to regain independence. Will follow.   Follow Up Recommendations  CIR;Supervision for mobility/OOB     Equipment Recommendations  None recommended by PT    Recommendations for Other Services       Precautions / Restrictions Precautions Precautions: Fall Precaution Comments: seizures Restrictions Weight Bearing Restrictions: No    Mobility  Bed Mobility Overal bed mobility: Needs Assistance Bed Mobility: Rolling;Sidelying to Sit Rolling: Min guard Sidelying to sit: Min assist;HOB elevated       General bed mobility comments: Step by step cues to get to EOB, use of rail. Assist with trunk and scooting bottom to EOB.  Transfers Overall transfer level: Needs assistance Equipment used: None;Rolling walker (2 wheeled) Transfers: Sit to/from Stand Sit to Stand: Min assist         General transfer comment: Stood from EOB x1, cues for hand placement. Transferred to chair post ambulation.  Ambulation/Gait Ambulation/Gait assistance: Min  assist Gait Distance (Feet): 60 Feet Assistive device: Rolling walker (2 wheeled) Gait Pattern/deviations: Step-through pattern;Ataxic;Decreased stride length;Decreased step length - right;Decreased step length - left;Steppage Gait velocity: decreased   General Gait Details: Slow, unsteady and ataxic like gait pattern through LUE/LE; Stays within left side of RW, steppage gait noted on LLE to advance.    Stairs             Wheelchair Mobility    Modified Rankin (Stroke Patients Only) Modified Rankin (Stroke Patients Only) Pre-Morbid Rankin Score: Moderate disability Modified Rankin: Moderately severe disability     Balance Overall balance assessment: Needs assistance Sitting-balance support: Feet supported;Bilateral upper extremity supported Sitting balance-Leahy Scale: Fair     Standing balance support: During functional activity;Bilateral upper extremity supported Standing balance-Leahy Scale: Poor Standing balance comment: Requires BUE support for standing balance.                             Cognition Arousal/Alertness: Awake/alert Behavior During Therapy: Flat affect Overall Cognitive Status: No family/caregiver present to determine baseline cognitive functioning                                        Exercises      General Comments        Pertinent Vitals/Pain Pain Assessment: Faces Faces Pain Scale: Hurts a little bit Pain Location: site of catheter Pain Descriptors / Indicators: Sore Pain Intervention(s): Monitored during session;Repositioned    Home Living  Prior Function            PT Goals (current goals can now be found in the care plan section) Progress towards PT goals: Progressing toward goals    Frequency    Min 4X/week      PT Plan Current plan remains appropriate    Co-evaluation              AM-PAC PT "6 Clicks" Daily Activity  Outcome Measure  Difficulty  turning over in bed (including adjusting bedclothes, sheets and blankets)?: A Little Difficulty moving from lying on back to sitting on the side of the bed? : Unable Difficulty sitting down on and standing up from a chair with arms (e.g., wheelchair, bedside commode, etc,.)?: Unable Help needed moving to and from a bed to chair (including a wheelchair)?: A Little Help needed walking in hospital room?: A Little Help needed climbing 3-5 steps with a railing? : A Lot 6 Click Score: 13    End of Session Equipment Utilized During Treatment: Gait belt Activity Tolerance: Patient tolerated treatment well Patient left: in chair;with call bell/phone within reach;with chair alarm set Nurse Communication: Mobility status PT Visit Diagnosis: Hemiplegia and hemiparesis;Difficulty in walking, not elsewhere classified (R26.2) Hemiplegia - Right/Left: Left Hemiplegia - dominant/non-dominant: Non-dominant Hemiplegia - caused by: Cerebral infarction     Time: 3606-7703 PT Time Calculation (min) (ACUTE ONLY): 26 min  Charges:  $Gait Training: 8-22 mins $Therapeutic Activity: 8-22 mins                     Wray Kearns, PT, DPT Acute Rehabilitation Services Pager [phone] Office [place] 11/23/2017, 11:50 AM

## 2017-11-23 NOTE — Consult Note (Signed)
Physical Medicine and Rehabilitation Consult Reason for Consult: Left-sided weakness Referring Physician: [facility]   HPI: Erica [name] is a 62 y.o. right-handed female with history of diabetes mellitus, diastolic congestive heart failure, hypertension, alcohol induced pancreatitis, seizure disorder maintained on Keppra.  Per chart review and patient, patient lives with son in [place].  Patient was using a walker prior to admission.  One level home.  Son works during the day.  She has a daughter that checks on her as needed. Patient with recent admission to [facility] in [place] for 12 days for ATN and emphysematous pyelonephritis treated with Zosyn and ureteral stents placed and was discharged 10/21/2017.  Presented 11/08/2017 to [facility] with generalized weakness and increasing confusion as well as abdominal pain and dysuria.  WBC 15,800, creatinine 11.93 from baseline of 4.00 on recent discharge.  CT renal stone study/renal ultrasound showed bilateral ureteral stents in place no hydronephrosis.  Probable small bilateral renal calculi versus residual contrast from recent study.  Nephrology as well as urology consulted and dialysis initiated.  On 11/14/2017 patient with twitching of her left side and became more lethargic.  She was transferred to [facility] for further evaluation.  MRI reviewed, showing bilateral infarcts.  Per report, scattered punctate foci of acute infarction manifest as foci of restricted diffusion within the deep white matter of both frontal parietal vertices findings consistent with watershed infarction.  EEG consist of intermittent short periods of attenuation that have superimposed beta activity with higher voltage over the right central parietal region.  Neurology follow-up patient's Keppra was adjusted and Dilantin added to regimen.  Echocardiogram with ejection fraction of 82% grade 1 diastolic dysfunction.  Hemodialysis currently ongoing  with follow-up per renal services.  Current plan is to keep ureteral stents in place and follow-up outpatient for removal per Dr. Jeffie Pollock of urology services.  Subcutaneous heparin for DVT prophylaxis.  Therapy evaluations completed with recommendations of physical medicine rehab consult.   Review of Systems  HENT: Negative for hearing loss.   Eyes: Negative for blurred vision and double vision.  Respiratory: Positive for shortness of breath. Negative for cough.   Cardiovascular: Negative for chest pain and palpitations.  Gastrointestinal: Positive for abdominal pain and nausea. Negative for vomiting.       GERD  Genitourinary: Negative for dysuria and hematuria.  Musculoskeletal: Positive for myalgias.  Neurological: Positive for sensory change, seizures and weakness.  Psychiatric/Behavioral: Positive for depression.  All other systems reviewed and are negative.  Past Medical History:  Diagnosis Date  . Alcohol-induced pancreatitis   . Chronic diarrhea   . Depression   . Diabetes mellitus    fasting blood sugar 110-120s  . Diastolic CHF ([place])   . DKA (diabetic ketoacidoses) ([place])   . Gastroparesis   . GERD (gastroesophageal reflux disease)   . Heart murmur   . History of kidney stones   . Hyperlipidemia   . Hypertension   . Hypokalemia   . Muscle spasm   . Neuropathic pain   . Neuropathy    Hx: of  . Pyelonephritis   . Recurrent pancreatitis   . Seizures ([place])   . Vitamin B12 deficiency   . Vitamin D deficiency    Past Surgical History:  Procedure Laterality Date  . CATARACT EXTRACTION W/PHACO Right 03/14/2015   Procedure: CATARACT EXTRACTION PHACO AND INTRAOCULAR LENS PLACEMENT (IOC);  Surgeon: Baruch Goldmann, MD;  Location: [facility];  Service: Ophthalmology;  Laterality: Right;  CDE:11.13  . CATARACT EXTRACTION W/PHACO Left 04/11/2015   Procedure: CATARACT EXTRACTION PHACO AND INTRAOCULAR LENS PLACEMENT LEFT EYE CDE=9.68;  Surgeon: Baruch Goldmann, MD;  Location: [facility];   Service: Ophthalmology;  Laterality: Left;  . COLONOSCOPY  02/24/2010  . cystoscopy with ureteral stent  Bilateral 10/07/2017   At [place] CV LINE RIGHT  11/17/2017  . IR US GUIDE VASC ACCESS RIGHT  11/17/2017  . MULTIPLE EXTRACTIONS WITH ALVEOLOPLASTY N/A 06/13/2012   Procedure: MULTIPLE EXTRACION WITH ALVEOLOPLASTY EXTRACT: 18, 19, 20, 21, 22, 24, 25, 27, 28, 29, 30, 31;  Surgeon: Gae Bon, DDS;  Location: [place];  Service: Oral Surgery;  Laterality: N/A;  . TUBAL LIGATION    . URETERAL STENT PLACEMENT  09/2017   Family History  Problem Relation Age of Onset  . Diabetes Sister   . Chronic Renal Failure Neg Hx    Social History:  reports that she has never smoked. Her smokeless tobacco use includes snuff. She reports that she drinks about 1.0 standard drinks of alcohol per week. She reports that she does not use drugs. Allergies:  Allergies  Allergen Reactions  . Aspirin Palpitations   Medications Prior to Admission  Medication Sig Dispense Refill  . albuterol (PROAIR HFA) 108 (90 BASE) MCG/ACT inhaler Inhale 2 puffs into the lungs every 6 (six) hours as needed for wheezing or shortness of breath.    Marland Kitchen amLODipine (NORVASC) 5 MG tablet Take 5 mg by mouth daily.    . cholecalciferol (VITAMIN D) 1000 units tablet Take 1,000 Units by mouth daily.    Marland Kitchen esomeprazole (NEXIUM) 40 MG capsule Take 40 mg by mouth daily.     . hyoscyamine (ANASPAZ) 0.125 MG TBDP disintergrating tablet Take 0.125 mg by mouth 4 (four) times daily -  with meals and at bedtime.     . insulin aspart (NOVOLOG) 100 UNIT/ML FlexPen Inject 3 Units into the skin 3 (three) times daily with meals. (Patient taking differently: Inject 4-10 Units into the skin 3 (three) times daily with meals. Take 15 minutes before meals) 3 mL 0  . Insulin Degludec (TRESIBA FLEXTOUCH) 200 UNIT/ML SOPN Inject 10 Units into the skin daily with breakfast.     . levETIRAcetam (KEPPRA) 1000 MG tablet Take 1 tablet  (1,000 mg total) by mouth 2 (two) times daily. 60 tablet 0  . linaclotide (LINZESS) 145 MCG CAPS capsule Take 145 mcg by mouth daily before breakfast.    . magnesium oxide (MAG-OX) 400 MG tablet Take 400 mg by mouth daily.    . metoCLOPramide (REGLAN) 5 MG tablet Take 5 mg by mouth 4 (four) times daily.    . metoprolol succinate (TOPROL-XL) 100 MG 24 hr tablet Take 100 mg by mouth daily. Take with or immediately following a meal.    . mirtazapine (REMERON) 15 MG tablet Take 15 mg by mouth at bedtime.    . OxyCODONE HCl, Abuse Deter, (OXAYDO) 5 MG TABA Take 5 mg by mouth every 12 (twelve) hours as needed for pain.     Marland Kitchen PARoxetine (PAXIL) 20 MG tablet Take 20 mg by mouth daily.    Marland Kitchen rOPINIRole (REQUIP) 1 MG tablet Take 2 mg by mouth daily. BETWEEN 7 PM AND 8 PM.     . sodium bicarbonate 650 MG tablet Take 650 mg by mouth 4 (four) times daily.      Home: Home Living Family/patient expects to be discharged to:: Private residence Living Arrangements: Children  Available Help at Discharge: Family Home Access: Level entry Home Layout: One level Bathroom Shower/Tub: Walk-in shower Home Equipment: Environmental consultant - 2 wheels, Radio producer - single point, Civil engineer, contracting  Lives With: Son  Functional History: Prior Function Level of Independence: Independent Comments: per pt independent with ADls and mobility Functional Status:  Mobility: Bed Mobility Overal bed mobility: Needs Assistance Bed Mobility: Rolling, Sidelying to Sit Rolling: Min guard Sidelying to sit: HOB elevated, Min assist General bed mobility comments: Step by step cues to get to EOB, use of rail. Transfers Overall transfer level: Needs assistance Equipment used: 1 person hand held assist Transfers: Sit to/from Stand Sit to Stand: Min assist Stand pivot transfers: Mod assist General transfer comment: Stood from EOB x2, from [facility] x2, SPT bed to/from [facility] with mod A. Assist for weight shifting to advance LEs. HR up to 123  bpm. Ambulation/Gait Ambulation/Gait assistance: Mod assist, +2 physical assistance Gait Distance (Feet): 40 Feet Assistive device: 2 person hand held assist Gait Pattern/deviations: Step-through pattern, Ataxic, Leaning posteriorly, Decreased stride length, Decreased step length - right General Gait Details: Slow, unsteady gait with ataxic like gait pattern through LUE/LE; assist with weight shifting to advance LEs; right lateral and posterior trunk lean. Gait velocity: decreased    ADL: ADL Overall ADL's : Needs assistance/impaired Eating/Feeding Details (indicate cue type and reason): was able to independently hold thickened juice cup with taking her meds Grooming: Oral care, Brushing hair, Set up, Supervision/safety Grooming Details (indicate cue type and reason): Pt using both hands for both tasks with LUE moving more slowly then RUE Upper Body Bathing: Moderate assistance, Bed level Lower Body Bathing: Total assistance, +2 for physical assistance, Bed level Upper Body Dressing : Maximal assistance, Bed level Lower Body Dressing: Total assistance, +2 for physical assistance, Bed level Lower Body Dressing Details (indicate cue type and reason): Pt able to cross her legs to get to her socks to doff and donn her socks Toilet Transfer: Moderate assistance, Stand-pivot, BSC Toilet Transfer Details (indicate cue type and reason): deferred  Toileting- Clothing Manipulation and Hygiene: Total assistance Toileting - Clothing Manipulation Details (indicate cue type and reason): with Mod A sit<>stand Functional mobility during ADLs: Maximal assistance, +2 for physical assistance General ADL Comments: bed mobility only, limited eval due to transport   Cognition: Cognition Overall Cognitive Status: No family/caregiver present to determine baseline cognitive functioning Arousal/Alertness: Awake/alert Orientation Level: Oriented X4 Attention: Sustained Sustained Attention: Appears  intact Memory: (TBA) Awareness: Impaired Awareness Impairment: Intellectual impairment Problem Solving: (TBA) Safety/Judgment: Impaired Cognition Arousal/Alertness: Awake/alert Behavior During Therapy: Flat affect Overall Cognitive Status: No family/caregiver present to determine baseline cognitive functioning Area of Impairment: Problem solving, Following commands Orientation Level: Disoriented to, Time Memory: Decreased short-term memory Following Commands: Follows one step commands with increased time, Follows multi-step commands inconsistently Safety/Judgement: Decreased awareness of deficits, Decreased awareness of safety Awareness: Intellectual Problem Solving: Slow processing, Difficulty sequencing, Decreased initiation, Requires verbal cues General Comments: Difficulty with motor planning, sequencing. Knows it is October, "1919"  Blood pressure 131/79, pulse 100, temperature 98.9 F (37.2 C), temperature source Oral, resp. rate 16, height 5' (1.524 m), weight 56 kg, SpO2 98 %. Physical Exam  Vitals reviewed. Constitutional: She appears well-developed.  62 year old right-handed frail African-American female  HENT:  Head: Normocephalic and atraumatic.  Eyes: EOM are normal. Right eye exhibits no discharge. Left eye exhibits no discharge.  Neck: Normal range of motion. Neck supple. No thyromegaly present.  Cardiovascular: Normal rate and regular rhythm.  Respiratory: Effort  normal and breath sounds normal. No respiratory distress.  GI: Soft. Bowel sounds are normal. She exhibits no distension.  Musculoskeletal:  No edema or tenderness in extremities  Neurological: She is alert.  Makes good eye contact with examiner.   She provides her name and age.   Follows simple commands Motor: 4+/5 grossly throughout Sensation diminished to light touch bilateral feet Left main No ataxia/dysmetria bilateral upper extremities, however slowed and measured  Skin: Skin is warm and dry.   Psychiatric: Her affect is blunt. She is slowed.    Results for orders placed or performed during the hospital encounter of 11/08/17 (from the past 24 hour(s))  Glucose, capillary     Status: Abnormal   Collection Time: 11/22/17  6:08 AM  Result Value Ref Range   Glucose-Capillary 137 (H) 70 - 99 mg/dL  Protime-INR     Status: Abnormal   Collection Time: 11/22/17  7:43 AM  Result Value Ref Range   Prothrombin Time 17.1 (H) 11.4 - 15.2 seconds   INR 1.41   Glucose, capillary     Status: None   Collection Time: 11/22/17  1:18 PM  Result Value Ref Range   Glucose-Capillary 79 70 - 99 mg/dL  Glucose, capillary     Status: None   Collection Time: 11/22/17  5:19 PM  Result Value Ref Range   Glucose-Capillary 92 70 - 99 mg/dL  Glucose, capillary     Status: Abnormal   Collection Time: 11/22/17  9:31 PM  Result Value Ref Range   Glucose-Capillary 156 (H) 70 - 99 mg/dL   Comment 1 Notify RN    Comment 2 Document in Chart    No results found.  Assessment/Plan: Diagnosis: Bilateral infarcts Labs and images (see above) independently reviewed.  Records reviewed and summated above. Stroke: Continue secondary stroke prophylaxis and Risk Factor Modification listed below:   Antiplatelet therapy:   Blood Pressure Management:  Continue current medication with prn's with permisive HTN per primary team Statin Agent:   Diabetes management:    1. Does the need for close, 24 hr/day medical supervision in concert with the patient's rehab needs make it unreasonable for this patient to be served in a less intensive setting? Yes  2. Co-Morbidities requiring supervision/potential complications: DM (Monitor in accordance with exercise and adjust meds as necessary), diastolic congestive heart failure (monitor for signs and symptoms of fluid overload), HTN (monitor and provide prns in accordance with increased physical exertion and pain), alcohol induced pancreatitis, seizure (continue meds) 3. Due to  safety, disease management and patient education, does the patient require 24 hr/day rehab nursing? Yes 4. Does the patient require coordinated care of a physician, rehab nurse, PT (1-2 hrs/day, 5 days/week) and OT (1-2 hrs/day, 5 days/week) to address physical and functional deficits in the context of the above medical diagnosis(es)? Yes Addressing deficits in the following areas: balance, endurance, locomotion, transferring, bathing, dressing, toileting and psychosocial support 5. Can the patient actively participate in an intensive therapy program of at least 3 hrs of therapy per day at least 5 days per week? Yes 6. The potential for patient to make measurable gains while on inpatient rehab is excellent 7. Anticipated functional outcomes upon discharge from inpatient rehab are supervision  with PT, supervision with OT, n/a with SLP. 8. Estimated rehab length of stay to reach the above functional goals is: 14-18 days. 9. Anticipated D/C setting: Home 10. Anticipated post D/C treatments: HH therapy and Home excercise program 11. Overall Rehab/Functional Prognosis: good  RECOMMENDATIONS: This patient's condition is appropriate for continued rehabilitative care in the following setting: CIR Patient has agreed to participate in recommended program. Yes Note that insurance prior authorization may be required for reimbursement for recommended care.  Comment: Rehab Admissions Coordinator to follow up.   I have personally performed a face to face diagnostic evaluation, including, but not limited to relevant history and physical exam findings, of this patient and developed relevant assessment and plan.  Additionally, I have reviewed and concur with the physician assistant's documentation above.   Delice Lesch, MD, ABPMR Erica Paganini Angiulli, PA-C 11/23/2017

## 2017-11-23 NOTE — Progress Notes (Addendum)
  Speech Language Pathology Treatment: Dysphagia  Patient Details Name: Erica [name] MRN: [id number] DOB: Oct 10, 1955 Today's Date: 11/23/2017 Time: 2284-0698 SLP Time Calculation (min) (ACUTE ONLY): 15 min  Assessment / Plan / Recommendation Clinical Impression  Pt seen to assure po tolerance.  She reports good appetite here and prior to admission.  SLP assisted pt to modify coffee to her satisfaction and consuming solids.  She does take very large boluses and benefited from cue to take small bites.  No oral pocketing nor indication of airway compromise. PT did verbalize risk of aspiration with solids worse than liquids and increased pulmonary ramifications if she does aspirating.   Suspect she is at her baseline regarding swallowing ability.  Informed pt of need to keep cough/voice strong for airway protection.   Pt also able to verbalize need to call for assist due to fall risk.  Recommend follow up at home for SLP cognitive linguistic treatment to assure functional in his environment.    HPI HPI: 62 year old female admitted 11/08/17 with weakness. Transferred to [facility] 11/15/27 due to seizure activity. PMH: DM2, HTN, seizure disorder, alcohol induced pancreatitis, GERD, HLD. MRI = Scattered punctate foci of acute infarction, c/w watershed infarction. MBS recommended to objectively assess the swallowing function and determine LRD secondary to overt coughing on thin liquids and coughing with D2 textures following thin liquids during MBS 11/18/2017.   Pt seen to assure tolerance of po diet - and to reinforce education.       SLP Plan  All goals met       Recommendations  Diet recommendations: Regular;Thin liquid Liquids provided via: Cup;Straw Medication Administration: Whole meds with puree Supervision: Patient able to self feed Compensations: Slow rate;Small sips/bites Postural Changes and/or Swallow Maneuvers: Seated upright 90 degrees;Upright 30-60 min after meal                Oral Care Recommendations: Oral care QID Follow up Recommendations: Home health SLP SLP Visit Diagnosis: Dysphagia, unspecified (R13.10) Plan: All goals met       GO              Luanna Salk, [place] [facility] SLP Acute Rehab Services Pager [phone] Office [phone]   Macario Golds 11/23/2017, 10:00 AM

## 2017-11-24 DIAGNOSIS — I5032 Chronic diastolic (congestive) heart failure: Secondary | ICD-10-CM

## 2017-11-24 LAB — GLUCOSE, CAPILLARY
GLUCOSE-CAPILLARY: 205 mg/dL — AB (ref 70–99)
GLUCOSE-CAPILLARY: 221 mg/dL — AB (ref 70–99)
GLUCOSE-CAPILLARY: 107 mg/dL — AB (ref 70–99)
GLUCOSE-CAPILLARY: 262 mg/dL — AB (ref 70–99)

## 2017-11-24 LAB — BASIC METABOLIC PANEL
ANION GAP: 7 (ref 5–15)
BUN: 16 mg/dL (ref 8–23)
CALCIUM: 7.6 mg/dL — AB (ref 8.9–10.3)
CO2: 22 mmol/L (ref 22–32)
CREATININE: 3.69 mg/dL — AB (ref 0.44–1.00)
Chloride: 104 mmol/L (ref 98–111)
GFR calc Af Amer: 14 mL/min — ABNORMAL LOW (ref >60)
GFR calc non Af Amer: 12 mL/min — ABNORMAL LOW (ref >60)
GLUCOSE: 215 mg/dL — AB (ref 70–99)
Potassium: 4.1 mmol/L (ref 3.5–5.1)
Sodium: 133 mmol/L — ABNORMAL LOW (ref 135–145)

## 2017-11-24 LAB — CBC
HEMATOCRIT: 26 % — AB (ref 36.0–46.0)
HEMOGLOBIN: 7.8 g/dL — AB (ref 12.0–15.0)
MCH: 28 pg (ref 26.0–34.0)
MCHC: 30 g/dL (ref 30.0–36.0)
MCV: 93.2 fL (ref 80.0–100.0)
Platelets: 264 10*3/uL (ref 150–400)
RBC: 2.79 MIL/uL — ABNORMAL LOW (ref 3.87–5.11)
RDW: 20.4 % — AB (ref 11.5–15.5)
WBC: 12.1 10*3/uL — ABNORMAL HIGH (ref 4.0–10.5)
nRBC: 0.2 % (ref 0.0–0.2)

## 2017-11-24 LAB — PHENYTOIN LEVEL, TOTAL: Phenytoin Lvl: 7.9 ug/mL — ABNORMAL LOW (ref 10.0–20.0)

## 2017-11-24 MED ORDER — HEPARIN SODIUM (PORCINE) 1000 UNIT/ML IJ SOLN
3.2000 mL | Freq: Once | INTRAMUSCULAR | Status: AC
  Administered 2017-11-24: 3200 [IU] via INTRAVENOUS

## 2017-11-24 MED ORDER — DARBEPOETIN ALFA 60 MCG/0.3ML IJ SOSY
PREFILLED_SYRINGE | INTRAMUSCULAR | Status: AC
  Filled 2017-11-24: qty 0.3

## 2017-11-24 MED ORDER — HEPARIN SODIUM (PORCINE) 1000 UNIT/ML IJ SOLN
INTRAMUSCULAR | Status: AC
  Filled 2017-11-24: qty 4

## 2017-11-24 MED ORDER — DARBEPOETIN ALFA 60 MCG/0.3ML IJ SOSY
60.0000 ug | PREFILLED_SYRINGE | INTRAMUSCULAR | Status: DC
  Administered 2017-11-24: 60 ug via INTRAVENOUS
  Filled 2017-11-24: qty 0.3

## 2017-11-24 NOTE — Progress Notes (Addendum)
PROGRESS NOTE    Erica [name]  ZYS:[id number] DOB: 1955-08-24 DOA: 11/08/2017 PCP: Octavio Graves, DO    Brief Narrative:  62 year old female who presented with weakness.  She does have significant past medical history of type 2 diabetes mellitus, hypertension and seizure disorder.  Recent hospitalization for 12 days, treated for pyelonephritis and acute tubular necrosis.  Had ureteral stents placed.  Patient complained of lower abdominal pain and dysuria, associated with poor oral intake for the last 2 days prior to admission.  On the initial physical examination blood pressure 125/69, heart rate 84, respirate 16, temperature 98, oxygen saturation 99%.  Moist mucous membranes, lungs clear to auscultation bilaterally, heart S2 present rhythmic, abdomen soft nontender, no lower extremity edema.  Sodium 137, potassium 5.3, chloride 113, bicarb 12, glucose 214, BUN 81, creatinine 11.9, white count 15.8, hemoglobin 10.1, hematocrit 32.1, platelets 533.  Urinalysis with 100 protein, greater than 50 red cells, greater than 50 white cells, total protein 481, protein creatinine ratio 10.4.  Patient was admitted to the hospital with working diagnosis of acute kidney injury with uremia and anion gap metabolic acidosis.    Assessment & Plan:   Principal Problem:   Renal failure Active Problems:   Diabetes mellitus (HCC)   Metabolic acidosis   Protein-calorie malnutrition, severe (HCC)   Seizure disorder ([place])   Acute renal failure superimposed on chronic kidney disease ([place])   Uncontrolled type 2 diabetes mellitus with hyperglycemia, with long-term current use of insulin (HCC)   Acute pyelonephritis   PICC (peripherally inserted central catheter) in place   AKI (acute kidney injury) ([place])   Chronic diastolic congestive heart failure ([place])   Essential hypertension   1. AKI on CKD unstageble. Patient tolerating well HD, scheduled for today. Access right IJ catheter. Pending outpatient HD  unit arrangement. No signs of volume overload. Continue aranesp.   2. Acute pyelonephritis. Completed antibiotic therapy, will need follow up with urology as outpatient for ureteral stents.   3. Seizures. Stable with no active seizures, will continue levettiracetam and phenytoin.   4. Acute CVA. Continue neuro checks per unit protocol. Pending placement at CIR.    DVT prophylaxis: heparin   Code Status: full  Family Communication: no family at the bedside  Disposition Plan/ discharge barriers: pending placement    Consultants:   Nephrology   Procedures:     Antimicrobials:       Subjective: Patient is feeling weak and deconditioned, no nausea or vomiting, no chest pain or dyspnea. Scheduled for HD today.   Objective: Vitals:   11/24/17 0500 11/24/17 0600 11/24/17 0700 11/24/17 0802  BP:    132/72  Pulse: (!) 105 (!) 105 (!) 108 100  Resp: 20 17 (!) 26 13  Temp:    99.5 F (37.5 C)  TempSrc:    Oral  SpO2: 98% 97% 100% 100%  Weight:   52.3 kg   Height:       No intake or output data in the 24 hours ending 11/24/17 1026 Filed Weights   11/22/17 1145 11/23/17 0337 11/24/17 0700  Weight: 56.1 kg 56 kg 52.3 kg    Examination:   General: Not in pain or dyspnea, deconditioned  Neurology: Awake and alert, non focal  E ENT: mild pallor, no icterus, oral mucosa moist Cardiovascular: No JVD. S1-S2 present, rhythmic, no gallops, rubs, or murmurs. No lower extremity edema. Pulmonary: vesicular breath sounds bilaterally, adequate air movement, no wheezing, rhonchi or rales. Gastrointestinal. Abdomen with, no organomegaly, non  tender, no rebound or guarding Skin. No rashes Musculoskeletal: no joint deformities Right IJ HD catheter.     Data Reviewed: I have personally reviewed following labs and imaging studies  CBC: Recent Labs  Lab 11/18/17 0434 11/20/17 0420 11/22/17 0500 11/24/17 0711  WBC 13.5* 10.5 11.1* 12.1*  HGB 8.6* 8.2* 7.7* 7.8*  HCT 28.7* 28.0*  26.2* 26.0*  MCV 89.1 93.0 94.9 93.2  PLT 188 160 210 562   Basic Metabolic Panel: Recent Labs  Lab 11/18/17 0434 11/20/17 0420 11/21/17 0735 11/22/17 0500 11/24/17 0711  NA 141 140 136 138 133*  K 3.2* 4.5 4.7 5.0 4.1  CL 109 113* 112* 114* 104  CO2 26 20* 17* 16* 22  GLUCOSE 74 167* 163* 148* 215*  BUN 14 8 14 18 16   CREATININE 3.93* 3.27* 3.95* 4.44* 3.69*  CALCIUM 7.5* 7.8* 7.8* 7.8* 7.6*   GFR: Estimated Creatinine Clearance: 11.2 mL/min (A) (by C-G formula based on SCr of 3.69 mg/dL (H)). Liver Function Tests: Recent Labs  Lab 11/21/17 0735  AST 29  ALT 12  ALKPHOS 93  BILITOT 0.2*  PROT 5.7*  ALBUMIN 1.8*   No results for input(s): LIPASE, AMYLASE in the last 168 hours. No results for input(s): AMMONIA in the last 168 hours. Coagulation Profile: Recent Labs  Lab 11/22/17 0743  INR 1.41   Cardiac Enzymes: No results for input(s): CKTOTAL, CKMB, CKMBINDEX, TROPONINI in the last 168 hours. BNP (last 3 results) No results for input(s): PROBNP in the last 8760 hours. HbA1C: No results for input(s): HGBA1C in the last 72 hours. CBG: Recent Labs  Lab 11/23/17 0606 11/23/17 1122 11/23/17 1703 11/23/17 2136 11/24/17 0654  GLUCAP 178* 103* 170* 218* 205*   Lipid Profile: No results for input(s): CHOL, HDL, LDLCALC, TRIG, CHOLHDL, LDLDIRECT in the last 72 hours. Thyroid Function Tests: No results for input(s): TSH, T4TOTAL, FREET4, T3FREE, THYROIDAB in the last 72 hours. Anemia Panel: No results for input(s): VITAMINB12, FOLATE, FERRITIN, TIBC, IRON, RETICCTPCT in the last 72 hours.    Radiology Studies: I have reviewed all of the imaging during this hospital visit personally     Scheduled Meds: . sodium chloride   Intravenous Once  . bacitracin   Topical BID  . Chlorhexidine Gluconate Cloth  6 each Topical Q0600  . darbepoetin (ARANESP) injection - DIALYSIS  60 mcg Intravenous Q Tue-HD  . feeding supplement (NEPRO CARB STEADY)  237 mL Oral  BID BM  . heparin injection (subcutaneous)  5,000 Units Subcutaneous Q8H  . insulin aspart  0-9 Units Subcutaneous TID WC  . levETIRAcetam  1,000 mg Oral Daily  . phenytoin  125 mg Oral BID  . saccharomyces boulardii  250 mg Oral BID  . sodium chloride flush  10-40 mL Intracatheter Q12H   Continuous Infusions: . sodium chloride 500 mL (11/21/17 2323)  . sodium chloride 100 mL/hr at 11/20/17 0934  . sodium chloride    . sodium chloride    . levETIRAcetam       LOS: 16 days        Tawni Millers, MD [facility] Pager [phone]

## 2017-11-24 NOTE — Progress Notes (Signed)
[facility] Progress Note   Subjective:      No interval issues  For HD today  CLIP as AKI in process -- she does not qualify for ESRD diagosis currently  No UOP  Objective Vitals:   11/24/17 0500 11/24/17 0600 11/24/17 0700 11/24/17 0802  BP:    132/72  Pulse: (!) 105 (!) 105 (!) 108 100  Resp: 20 17 (!) 26 13  Temp:    99.5 F (37.5 C)  TempSrc:    Oral  SpO2: 98% 97% 100% 100%  Weight:   52.3 kg   Height:       Physical Exam General: lying comfortably Heart: RRR, no rub Lungs: normal WOB, clear anteriorly Abdomen: soft, nontender Extremities: no LE edema Dialysis Access:  RIJ tunneled HD cath c/d/i  Additional Objective Labs: Basic Metabolic Panel: Recent Labs  Lab 11/21/17 0735 11/22/17 0500 11/24/17 0711  NA 136 138 133*  K 4.7 5.0 4.1  CL 112* 114* 104  CO2 17* 16* 22  GLUCOSE 163* 148* 215*  BUN 14 18 16   CREATININE 3.95* 4.44* 3.69*  CALCIUM 7.8* 7.8* 7.6*   Liver Function Tests: Recent Labs  Lab 11/21/17 0735  AST 29  ALT 12  ALKPHOS 93  BILITOT 0.2*  PROT 5.7*  ALBUMIN 1.8*   No results for input(s): LIPASE, AMYLASE in the last 168 hours. CBC: Recent Labs  Lab 11/18/17 0434 11/20/17 0420 11/22/17 0500 11/24/17 0711  WBC 13.5* 10.5 11.1* 12.1*  HGB 8.6* 8.2* 7.7* 7.8*  HCT 28.7* 28.0* 26.2* 26.0*  MCV 89.1 93.0 94.9 93.2  PLT 188 160 210 264   Blood Culture    Component Value Date/Time   SDES BLOOD CENTRAL LINE 11/16/2017 1710   SPECREQUEST  11/16/2017 1710    BOTTLES DRAWN AEROBIC AND ANAEROBIC Blood Culture results may not be optimal due to an excessive volume of blood received in culture bottles   CULT  11/16/2017 1710    NO GROWTH 5 DAYS Performed at [facility] [address], [place], [place] [address]    REPTSTATUS 11/21/2017 FINAL 11/16/2017 1710    Cardiac Enzymes: No results for input(s): CKTOTAL, CKMB, CKMBINDEX, TROPONINI in the last 168 hours. CBG: Recent Labs  Lab  11/23/17 0606 11/23/17 1122 11/23/17 1703 11/23/17 2136 11/24/17 0654  GLUCAP 178* 103* 170* 218* 205*   Iron Studies:  No results for input(s): IRON, TIBC, TRANSFERRIN, FERRITIN in the last 72 hours. @lablastinr3 @ Studies/Results: Ir Fluoro Guide Cv Line Right  Result Date: 11/23/2017 INDICATION: Right jugular temporary dialysis catheter in place. Permanent dialysis catheter placement requested. EXAM: RIGHT HEMODIALYSIS CATHETER TEMPORARY TO TUNNELED CONVERSION MEDICATIONS: Ancef 2 g; The antibiotic was administered within an appropriate time interval prior to skin puncture. ANESTHESIA/SEDATION: Moderate (conscious) sedation was employed during this procedure. A total of Versed 1 mg and Fentanyl 50 mcg was administered intravenously. Moderate Sedation Time: 11 minutes. The patient's level of consciousness and vital signs were monitored continuously by radiology nursing throughout the procedure under my direct supervision. FLUOROSCOPY TIME:  Fluoroscopy Time:  minutes 36 seconds (1 mGy). COMPLICATIONS: None immediate. PROCEDURE: Informed written consent was obtained from the patient after a thorough discussion of the procedural risks, benefits and alternatives. All questions were addressed. Maximal Sterile Barrier Technique was utilized including caps, mask, sterile gowns, sterile gloves, sterile drape, hand hygiene and skin antiseptic. A timeout was performed prior to the initiation of the procedure. The right neck was prepped and draped in a sterile  fashion. 1% lidocaine was utilized for local anesthesia. The temporary dialysis catheter was removed over an Amplatz wire. An incision was made in the upper chest. A 19 cm tip to cuff dialysis catheter was advanced from the chest incision out the neck entry site. A peel-away sheath was advanced over the Amplatz wire. The leading edge of the dialysis catheter was advanced through the peel-away sheath. The peel-away sheath was removed. The neck incision was  closed with a 4 0 Vicryl subcuticular stitch. The chest incision was closed with an 0 Prolene pursestring. It was flushed and instilled with heparinized saline. FINDINGS: Tip of the tunneled dialysis catheter is at the cavoatrial junction. IMPRESSION: Successful right jugular temporary to tunneled dialysis catheter conversion. Electronically Signed   By: Marybelle Killings M.D.   On: 11/23/2017 07:11   Medications: . sodium chloride 500 mL (11/21/17 2323)  . sodium chloride 100 mL/hr at 11/20/17 0934  . sodium chloride    . sodium chloride    . levETIRAcetam     . sodium chloride   Intravenous Once  . bacitracin   Topical BID  . Chlorhexidine Gluconate Cloth  6 each Topical Q0600  . darbepoetin (ARANESP) injection - DIALYSIS  60 mcg Intravenous Q Tue-HD  . feeding supplement (NEPRO CARB STEADY)  237 mL Oral BID BM  . heparin injection (subcutaneous)  5,000 Units Subcutaneous Q8H  . insulin aspart  0-9 Units Subcutaneous TID WC  . levETIRAcetam  1,000 mg Oral Daily  . phenytoin  125 mg Oral BID  . saccharomyces boulardii  250 mg Oral BID  . sodium chloride flush  10-40 mL Intracatheter Q12H     Assessment/Plan:  1.  AKI on CKD:  Recent course with emphysematous pyelonephritis treated at [facility], urologic intervention double J stents placed.  During that admission Cr ~ 8 on presentation, down to 4 on discharge.  Last known Cr 0.8 in 2017.  Represented 2 weeks later (10/2017) with Cr up to 11.   She was initially hydrated but due lack of improved had HD x 2 at [facility] prior to transfer here on 9/29. She has required ongoing HD here.   Remains without any evidence of GFR recovery.  Currently continuing 3 times weekly hemodialysis.  CLIP in process as AKI.  Will need to consider placement of permanent access.  HD today  2.   HTN:   Stable.    3.  Anemia:  Hb has trended down significantly from 10.1 on 9/23 to 7.2 and she rec'd transfusion 10/1.  Stable today at 8.2 10/5.  Iron indices 9/30 show  iron replete.  Hemoccult + and started on PPI but no signs of gross bleeding.  On weekly aranesp, CTM  4.  BMM: stable  5. Seizures:  H/o hypoglycemia induced seizures; neurology following here and dosing meds for dialysis at this time.  She appears to have improved.    6.  E coli UTI: Completed ceftriaxone.  Has ureteral stents in place from prior hospitalization.  We will follow-up with urology for stent removal.  7.  DM: per primary.   [name]  11/24/2017, 10:29 AM  [facility]

## 2017-11-24 NOTE — Progress Notes (Signed)
Inpatient Rehabilitation Admissions Coordinator  I spoke with Erica [name] in hemodialysis. CLIP pending ESRD statement from Nephrology. I await  Admission to inpt rehab pending CLIP, for patient likely to have short length of stay if admitted to inpt rehab.   Danne Baxter, RN, MSN Rehab Admissions Coordinator [phone] 11/24/2017 9:10 AM

## 2017-11-24 NOTE — Progress Notes (Signed)
PT Cancellation Note  Patient Details Name: Erica KENEDY MRN: [id number] DOB: 1955/11/23   Cancelled Treatment:    Reason Eval/Treat Not Completed: (P) Patient at procedure or test/unavailable(Pt off unit for HD will continue PT per POC.  )   Brenda Samano Eli Hose 11/24/2017, 4:53 PM  Governor Rooks, PTA Acute Rehabilitation Services Pager [phone] Office [phone]

## 2017-11-24 NOTE — Progress Notes (Signed)
Inpatient Diabetes Program Recommendations  AACE/ADA: New Consensus Statement on Inpatient Glycemic Control (2019)  Target Ranges:  Prepandial:   less than 140 mg/dL      Peak postprandial:   less than 180 mg/dL (1-2 hours)      Critically ill patients:  140 - 180 mg/dL   Results for MARGRETE, DELUDE (MRN [id number]) as of 11/24/2017 11:42  Ref. Range 11/23/2017 06:06 11/23/2017 11:22 11/23/2017 17:03 11/23/2017 21:36 11/24/2017 06:54 11/24/2017 11:16  Glucose-Capillary Latest Ref Range: 70 - 99 mg/dL 178 (H) 103 (H) 170 (H) 218 (H) 205 (H) 221 (H)   Review of Glycemic Control  Current orders for Inpatient glycemic control: Novolog 0-9 units TID with meals  Inpatient Diabetes Program Recommendations: Correction (SSI): Please consider ordering Novolog 0-5 units QHS for bedtime correction scale. Insulin - Meal Coverage: If post prandial glucose is consistently greater than 180 mg/dl, please consider ordering Novolog 2 units TID with meals for meal coverage if patient eats at lest 50% of meals. Diet: Add Carb Mod to Renal diet order.  Thanks, Barnie Alderman, RN, MSN, CDE Diabetes Coordinator Inpatient Diabetes Program [phone] (Team Pager from 8am to 5pm)

## 2017-11-24 NOTE — Progress Notes (Signed)
[place] for phenytoin dosing Indication: Patient safety/ therapeutic monitoring  Allergies  Allergen Reactions  . Aspirin Palpitations    Patient Measurements: Height: 5' (152.4 cm) Weight: 115 lb 4.8 oz (52.3 kg) IBW/kg (Calculated) : 45.5   Vital Signs: Temp: 99.5 F (37.5 C) (10/09 0802) Temp Source: Oral (10/09 0802) BP: 132/72 (10/09 0802) Pulse Rate: 100 (10/09 0802) Intake/Output from previous day: No intake/output data recorded. Intake/Output from this shift: No intake/output data recorded.  Labs: Recent Labs    11/22/17 0500 11/24/17 0711  WBC 11.1* 12.1*  HGB 7.7* 7.8*  HCT 26.2* 26.0*  PLT 210 264  CREATININE 4.44* 3.69*   Estimated Creatinine Clearance: 11.2 mL/min (A) (by C-G formula based on SCr of 3.69 mg/dL (H)).    Medical History: Past Medical History:  Diagnosis Date  . Alcohol-induced pancreatitis   . Chronic diarrhea   . Depression   . Diabetes mellitus    fasting blood sugar 110-120s  . Diastolic CHF ([place])   . DKA (diabetic ketoacidoses) ([place])   . Gastroparesis   . GERD (gastroesophageal reflux disease)   . Heart murmur   . History of kidney stones   . Hyperlipidemia   . Hypertension   . Hypokalemia   . Muscle spasm   . Neuropathic pain   . Neuropathy    Hx: of  . Pyelonephritis   . Recurrent pancreatitis   . Seizures ([place])   . Vitamin B12 deficiency   . Vitamin D deficiency     Medications:  Scheduled:  . sodium chloride   Intravenous Once  . bacitracin   Topical BID  . Chlorhexidine Gluconate Cloth  6 each Topical Q0600  . darbepoetin (ARANESP) injection - DIALYSIS  60 mcg Intravenous Q Tue-HD  . feeding supplement (NEPRO CARB STEADY)  237 mL Oral BID BM  . heparin injection (subcutaneous)  5,000 Units Subcutaneous Q8H  . insulin aspart  0-9 Units Subcutaneous TID WC  . levETIRAcetam  1,000 mg Oral Daily  . phenytoin  125 mg Oral BID  . saccharomyces boulardii  250 mg  Oral BID  . sodium chloride flush  10-40 mL Intracatheter Q12H    Assessment: 62 yo F recently initiated on dialysis started on phenytoin inpatient for breakthrough seizures. Neurology initially followed pt & wanted pharmacy to take over dosing for phenytoin. Pt's dose was reduced from 100mg  q8h to 125mg  q12h on 10/2.   Total phenytoin level of 7.9 corrected to 17.2 with low albumin in ESRD and is therapeutic 8 days s/p adjustment to 125mg  q 12h.    Plan:  Continue phenytoin 125mg  q 12h Corrected phenytoin level goal = 10-20 Monitor for s/s of phenytoin toxicity Phenytoin level as appropriate  Bertis Ruddy, PharmD Clinical Pharmacist Please check AMION for all [place] numbers 11/24/2017 8:46 AM

## 2017-11-24 NOTE — Progress Notes (Signed)
Occupational Therapy Treatment Patient Details Name: Erica [name] MRN: [id number] DOB: May 22, 1955 Today's Date: 11/24/2017    History of present illness Pt is a 62 y/o female presents with Lft facial, Lft UE and Lft LE twitching at dialysis and admitted with a breakthrough seizure. 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 s/pRIJ HD cath placed 10/7. PMH includes DM with history of DKA, HTN, HLD, CKD, cardiac murmur, chronic diarrhea, nephrolithiasis with history of pyelonephritis, gastroparesis with GERD and a history of recurrent alcohol-associated pancreatitis, and a history of major depression disorder.    OT comments  Pt progressing towards acute OT goals. Focus of session was bed mobility, sitting EOB 2-3 minutes in unsupported seating, simulated toilet transfer, and grooming tasks. Pt did endorse some OOB dizziness which lessened by the end of the session. D/c plan to CIR remains appropriate from OT standpoint.    Follow Up Recommendations  Supervision/Assistance - 24 hour;CIR    Equipment Recommendations  Other (comment)    Recommendations for Other Services      Precautions / Restrictions Precautions Precautions: Fall Precaution Comments: seizures Restrictions Weight Bearing Restrictions: No       Mobility Bed Mobility Overal bed mobility: Needs Assistance Bed Mobility: Rolling;Sidelying to Sit Rolling: Min guard Sidelying to sit: Min assist;HOB elevated       General bed mobility comments: increased time and effort, steadying assist. cues for full completion of task.  Transfers Overall transfer level: Needs assistance Equipment used: Rolling walker (2 wheeled) Transfers: Sit to/from [facility] Sit to Stand: Min assist Stand pivot transfers: Mod assist       General transfer comment: from EOB to recliner. Steadying assist. Difficulty sequencing movements with rw.     Balance Overall balance  assessment: Needs assistance Sitting-balance support: Feet supported;Bilateral upper extremity supported Sitting balance-Leahy Scale: Fair     Standing balance support: During functional activity;Bilateral upper extremity supported Standing balance-Leahy Scale: Poor Standing balance comment: Requires BUE support for standing balance.                            ADL either performed or assessed with clinical judgement   ADL Overall ADL's : Needs assistance/impaired   Eating/Feeding Details (indicate cue type and reason): was able to independently open twist top container lid with extra time and effort and drink with supervision Grooming: Oral care;Wash/dry hands;Wash/dry face;Sitting;Supervision/safety Grooming Details (indicate cue type and reason): Pt using both hands for both tasks with LUE moving more slowly then RUE. Seated in recliner (supported seating)                 Toilet Transfer: Moderate assistance;Stand-pivot;BSC Toilet Transfer Details (indicate cue type and reason): simulated with EOB>recliner           General ADL Comments: bed mobility, sat EOB about 2-3 minutes then SPT to recliner. Pt then completed grooming tasks.      Vision       Perception     Praxis      Cognition Arousal/Alertness: Awake/alert Behavior During Therapy: Flat affect Overall Cognitive Status: No family/caregiver present to determine baseline cognitive functioning Area of Impairment: Problem solving;Following commands                     Memory: Decreased short-term memory Following Commands: Follows one step commands with increased time;Follows multi-step commands inconsistently Safety/Judgement: Decreased awareness of deficits;Decreased awareness of safety Awareness: Intellectual  Problem Solving: Slow processing;Difficulty sequencing;Decreased initiation;Requires verbal cues General Comments: Difficulty with motor planning, sequencing        Exercises      Shoulder Instructions       General Comments + dizziness once sitting EOB, stayed the same while standing, lessened somewhat once sitting in recliner    Pertinent Vitals/ Pain       Pain Assessment: No/denies pain  Home Living                                          Prior Functioning/Environment              Frequency  Min 2X/week        Progress Toward Goals  OT Goals(current goals can now be found in the care plan section)  Progress towards OT goals: Progressing toward goals  Acute Rehab OT Goals Patient Stated Goal: to get stronger OT Goal Formulation: With patient Time For Goal Achievement: 12/01/17 Potential to Achieve Goals: Fair ADL Goals Pt Will Perform Grooming: with supervision;sitting Pt Will Transfer to Toilet: bedside commode;stand pivot transfer;with mod assist Additional ADL Goal #1: Pt will complete bed mobility with min assist and maintain sitting balance EOB with supervision for 5 minutes as precursor for ADLs.  Plan Frequency remains appropriate;Discharge plan remains appropriate    Co-evaluation                 AM-PAC PT "6 Clicks" Daily Activity     Outcome Measure   Help from another person eating meals?: A Little Help from another person taking care of personal grooming?: A Little Help from another person toileting, which includes using toliet, bedpan, or urinal?: A Lot Help from another person bathing (including washing, rinsing, drying)?: A Lot Help from another person to put on and taking off regular upper body clothing?: A Lot Help from another person to put on and taking off regular lower body clothing?: A Lot 6 Click Score: 14    End of Session Equipment Utilized During Treatment: Gait belt  OT Visit Diagnosis: Other abnormalities of gait and mobility (R26.89);Muscle weakness (generalized) (M62.81);Other symptoms and signs involving cognitive function   Activity Tolerance Patient tolerated  treatment well;Other (comment)(+ dizziness)   Patient Left with call bell/phone within reach;in chair;with chair alarm set   Nurse Communication          Time: [id number] OT Time Calculation (min): 22 min  Charges: OT General Charges $OT Visit: 1 Visit OT Treatments $Self Care/Home Management : 8-22 mins  Tyrone Schimke, OT Acute Rehabilitation Services Pager: [phone] Office: [phone]    Hortencia Pilar 11/24/2017, 11:21 AM

## 2017-11-25 LAB — GLUCOSE, CAPILLARY
Glucose-Capillary: 204 mg/dL — ABNORMAL HIGH (ref 70–99)
Glucose-Capillary: 141 mg/dL — ABNORMAL HIGH (ref 70–99)
Glucose-Capillary: 328 mg/dL — ABNORMAL HIGH (ref 70–99)
Glucose-Capillary: 248 mg/dL — ABNORMAL HIGH (ref 70–99)

## 2017-11-25 MED ORDER — LEVETIRACETAM 500 MG PO TABS
500.0000 mg | ORAL_TABLET | ORAL | Status: DC
  Administered 2017-11-25 – 2017-11-27 (×2): 500 mg via ORAL
  Filled 2017-11-25 (×3): qty 1

## 2017-11-25 MED ORDER — TOPIRAMATE 25 MG PO TABS
25.0000 mg | ORAL_TABLET | Freq: Two times a day (BID) | ORAL | Status: DC
  Administered 2017-11-25 – 2017-11-29 (×9): 25 mg via ORAL
  Filled 2017-11-25 (×9): qty 1

## 2017-11-25 NOTE — Progress Notes (Signed)
[facility] Progress Note   Subjective:      No interval issues  HD yesterday, 1.5 L removed.  Tolerated well  CLIP as AKI in process -- she does not qualify for ESRD diagosis currently  No UOP  Objective Vitals:   11/24/17 1915 11/24/17 2350 11/25/17 0348 11/25/17 0805  BP: 137/67 136/63 (!) 146/73 (!) 144/71  Pulse: 98  94 91  Resp:    18  Temp: 98.6 F (37 C) (!) 100.4 F (38 C) 99.3 F (37.4 C) 99.3 F (37.4 C)  TempSrc: Axillary Oral Oral Axillary  SpO2: 98% 98% 100% 100%  Weight:      Height:       Physical Exam General: lying comfortably Heart: RRR, no rub Lungs: normal WOB, clear anteriorly Abdomen: soft, nontender Extremities: no LE edema Dialysis Access:  RIJ tunneled HD cath c/d/i  Additional Objective Labs: Basic Metabolic Panel: Recent Labs  Lab 11/21/17 0735 11/22/17 0500 11/24/17 0711  NA 136 138 133*  K 4.7 5.0 4.1  CL 112* 114* 104  CO2 17* 16* 22  GLUCOSE 163* 148* 215*  BUN 14 18 16   CREATININE 3.95* 4.44* 3.69*  CALCIUM 7.8* 7.8* 7.6*   Liver Function Tests: Recent Labs  Lab 11/21/17 0735  AST 29  ALT 12  ALKPHOS 93  BILITOT 0.2*  PROT 5.7*  ALBUMIN 1.8*   No results for input(s): LIPASE, AMYLASE in the last 168 hours. CBC: Recent Labs  Lab 11/20/17 0420 11/22/17 0500 11/24/17 0711  WBC 10.5 11.1* 12.1*  HGB 8.2* 7.7* 7.8*  HCT 28.0* 26.2* 26.0*  MCV 93.0 94.9 93.2  PLT 160 210 264   Blood Culture    Component Value Date/Time   SDES BLOOD CENTRAL LINE 11/16/2017 1710   SPECREQUEST  11/16/2017 1710    BOTTLES DRAWN AEROBIC AND ANAEROBIC Blood Culture results may not be optimal due to an excessive volume of blood received in culture bottles   CULT  11/16/2017 1710    NO GROWTH 5 DAYS Performed at [facility] [address], [place], [place] [address]    REPTSTATUS 11/21/2017 FINAL 11/16/2017 1710    Cardiac Enzymes: No results for input(s): CKTOTAL, CKMB, CKMBINDEX, TROPONINI in the  last 168 hours. CBG: Recent Labs  Lab 11/24/17 1116 11/24/17 1800 11/24/17 2116 11/25/17 0625 11/25/17 1112  GLUCAP 221* 107* 262* 204* 141*   Iron Studies:  No results for input(s): IRON, TIBC, TRANSFERRIN, FERRITIN in the last 72 hours. @lablastinr3 @ Studies/Results: No results found. Medications: . sodium chloride 500 mL (11/21/17 2323)  . sodium chloride 100 mL/hr at 11/20/17 0934   . sodium chloride   Intravenous Once  . bacitracin   Topical BID  . Chlorhexidine Gluconate Cloth  6 each Topical Q0600  . darbepoetin (ARANESP) injection - DIALYSIS  60 mcg Intravenous Q Wed-HD  . feeding supplement (NEPRO CARB STEADY)  237 mL Oral BID BM  . heparin injection (subcutaneous)  5,000 Units Subcutaneous Q8H  . insulin aspart  0-9 Units Subcutaneous TID WC  . levETIRAcetam  1,000 mg Oral Daily  . levETIRAcetam  500 mg Oral Q T,Th,Sat-1800  . phenytoin  125 mg Oral BID  . saccharomyces boulardii  250 mg Oral BID  . sodium chloride flush  10-40 mL Intracatheter Q12H     Assessment/Plan:  1.  AKI on CKD:  Recent course with emphysematous pyelonephritis treated at [facility], urologic intervention double J stents placed.  During that admission Cr ~ 8 on  presentation, down to 4 on discharge.  Last known Cr 0.8 in 2017.  Represented 2 weeks later (10/2017) with Cr up to 11.   She was initially hydrated but due lack of improved had HD x 2 at [name] prior to transfer here on 9/29. She has required ongoing HD here.   Remains without any evidence of GFR recovery.  Currently continuing 3 times weekly hemodialysis.  CLIP in process as AKI.  Will need to consider placement of permanent access.  HD tomorrow: 2K, 1.5L UF, Qb 350, Tight heparin  2.   HTN:   Stable.    3.  Anemia:  Hb has trended down significantly from 10.1 on 9/23 to 7.2 and she rec'd transfusion 10/1.  Iron indices 9/30 show iron replete.  Hemoccult + and started on PPI but no signs of gross bleeding.  On weekly aranesp,  CTM  4.  BMM: stable  5. Seizures:  H/o hypoglycemia induced seizures; neurology following here and dosing meds for dialysis at this time.  She appears to have improved.    6.  E coli UTI: Completed ceftriaxone.  Has ureteral stents in place from prior hospitalization.  We will follow-up with urology for stent removal.  7.  DM: per primary.   [name]  11/25/2017, 11:59 AM  [facility]

## 2017-11-25 NOTE — Progress Notes (Signed)
PROGRESS NOTE    LATESE DUFAULT  XVQ:[id number] DOB: [birth year] DOA: 11/08/2017 PCP: Octavio Graves, DO    Brief Narrative:  62 year old female who presented with weakness.  She does have significant past medical history of type 2 diabetes mellitus, hypertension and seizure disorder.  Recent hospitalization for 12 days, treated for pyelonephritis and acute tubular necrosis.  Had ureteral stents placed.  Patient complained of lower abdominal pain and dysuria, associated with poor oral intake for the last 2 days prior to admission.  On the initial physical examination blood pressure 125/69, heart rate 84, respirate 16, temperature 98, oxygen saturation 99%.  Moist mucous membranes, lungs clear to auscultation bilaterally, heart S2 present rhythmic, abdomen soft nontender, no lower extremity edema.  Sodium 137, potassium 5.3, chloride 113, bicarb 12, glucose 214, BUN 81, creatinine 11.9, white count 15.8, hemoglobin 10.1, hematocrit 32.1, platelets 533.  Urinalysis with 100 protein, greater than 50 red cells, greater than 50 white cells, total protein 481, protein creatinine ratio 10.4.  Patient was admitted to the hospital with working diagnosis of acute kidney injury with uremia and anion gap metabolic acidosis.    Assessment & Plan:   Principal Problem:   Renal failure Active Problems:   Diabetes mellitus (HCC)   Metabolic acidosis   Protein-calorie malnutrition, severe (HCC)   Seizure disorder ([place])   Acute renal failure superimposed on chronic kidney disease ([place])   Uncontrolled type 2 diabetes mellitus with hyperglycemia, with long-term current use of insulin (HCC)   Acute pyelonephritis   PICC (peripherally inserted central catheter) in place   AKI (acute kidney injury) ([place])   Chronic diastolic congestive heart failure ([place])   Essential hypertension  1. AKI on CKD unstageble. On HD, tolerating well, scheduled for AM. Clinically continue to be euvolemic. Follow with nephrology  recommendations for outpatient HD.   2. Acute pyelonephritis. Off antibiotic therapy, need outpatient follow up for ureteral stents.   3. Seizures. Patient had a breakthrough seizure after starting hemodialysis 09/29. Currently with no further seizures will continue with  levetiracetam and phenytoin. Will resume topiramate 25 mg po bid. No active seizures.   4. 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 Positive ischemic events per MRI, continue blood pressure control and antiplatelet therapy.   5. T2DM. Continue insulin sliding scale for glucose cover and monitoring, patient is tolerating po well. Capillary glucose 262, 204, 141.   6, HTN. Continue blood pressure monitoring, not on antihypertensive agents currently. Blood pressure systolic 093 and 267 mmHg.   7. Severe calorie protein-malnutrition. Continue nutritional supplements.   8. Anemia sp 1 unit PRBC transfusion. Multifactorial, no clinical signs of bleeding, continue Aranesp.    DVT prophylaxis: heparin   Code Status: full  Family Communication: no family at the bedside  Disposition Plan/ discharge barriers: pending placement    Consultants:   Nephrology   Procedures:     Antimicrobials:   Subjective: Patient with no dyspnea, or chest pain, no nausea or vomiting, has been tolerating HD well.   Objective: Vitals:   11/24/17 1915 11/24/17 2350 11/25/17 0348 11/25/17 0805  BP: 137/67 136/63 (!) 146/73 (!) 144/71  Pulse: 98  94 91  Resp:    18  Temp: 98.6 F (37 C) (!) 100.4 F (38 C) 99.3 F (37.4 C) 99.3 F (37.4 C)  TempSrc: Axillary Oral Oral Axillary  SpO2: 98% 98% 100% 100%  Weight:      Height:  Intake/Output Summary (Last 24 hours) at 11/25/2017 1136 Last data filed at 11/25/2017 0900 Gross per 24 hour  Intake 560 ml  Output 1500 ml  Net -940 ml   Filed Weights   11/24/17 0700 11/24/17 1410 11/24/17 1720  Weight: 52.3  kg 50.3 kg 48.8 kg    Examination:   General: deconditioned  Neurology: Awake and alert, non focal  E ENT: mild pallor, no icterus, oral mucosa moist Cardiovascular: No JVD. S1-S2 present, rhythmic, no gallops, rubs, or murmurs. No lower extremity edema. Pulmonary: positive breath sounds bilaterally, adequate air movement, no wheezing, rhonchi or rales. Gastrointestinal. Abdomen with, no organomegaly, non tender, no rebound or guarding Skin. No rashes Musculoskeletal: no joint deformities     Data Reviewed: I have personally reviewed following labs and imaging studies  CBC: Recent Labs  Lab 11/20/17 0420 11/22/17 0500 11/24/17 0711  WBC 10.5 11.1* 12.1*  HGB 8.2* 7.7* 7.8*  HCT 28.0* 26.2* 26.0*  MCV 93.0 94.9 93.2  PLT 160 210 269   Basic Metabolic Panel: Recent Labs  Lab 11/20/17 0420 11/21/17 0735 11/22/17 0500 11/24/17 0711  NA 140 136 138 133*  K 4.5 4.7 5.0 4.1  CL 113* 112* 114* 104  CO2 20* 17* 16* 22  GLUCOSE 167* 163* 148* 215*  BUN 8 14 18 16   CREATININE 3.27* 3.95* 4.44* 3.69*  CALCIUM 7.8* 7.8* 7.8* 7.6*   GFR: Estimated Creatinine Clearance: 11.2 mL/min (A) (by C-G formula based on SCr of 3.69 mg/dL (H)). Liver Function Tests: Recent Labs  Lab 11/21/17 0735  AST 29  ALT 12  ALKPHOS 93  BILITOT 0.2*  PROT 5.7*  ALBUMIN 1.8*   No results for input(s): LIPASE, AMYLASE in the last 168 hours. No results for input(s): AMMONIA in the last 168 hours. Coagulation Profile: Recent Labs  Lab 11/22/17 0743  INR 1.41   Cardiac Enzymes: No results for input(s): CKTOTAL, CKMB, CKMBINDEX, TROPONINI in the last 168 hours. BNP (last 3 results) No results for input(s): PROBNP in the last 8760 hours. HbA1C: No results for input(s): HGBA1C in the last 72 hours. CBG: Recent Labs  Lab 11/24/17 1116 11/24/17 1800 11/24/17 2116 11/25/17 0625 11/25/17 1112  GLUCAP 221* 107* 262* 204* 141*   Lipid Profile: No results for input(s): CHOL, HDL,  LDLCALC, TRIG, CHOLHDL, LDLDIRECT in the last 72 hours. Thyroid Function Tests: No results for input(s): TSH, T4TOTAL, FREET4, T3FREE, THYROIDAB in the last 72 hours. Anemia Panel: No results for input(s): VITAMINB12, FOLATE, FERRITIN, TIBC, IRON, RETICCTPCT in the last 72 hours.    Radiology Studies: I have reviewed all of the imaging during this hospital visit personally     Scheduled Meds: . sodium chloride   Intravenous Once  . bacitracin   Topical BID  . Chlorhexidine Gluconate Cloth  6 each Topical Q0600  . darbepoetin (ARANESP) injection - DIALYSIS  60 mcg Intravenous Q Wed-HD  . feeding supplement (NEPRO CARB STEADY)  237 mL Oral BID BM  . heparin injection (subcutaneous)  5,000 Units Subcutaneous Q8H  . insulin aspart  0-9 Units Subcutaneous TID WC  . levETIRAcetam  1,000 mg Oral Daily  . levETIRAcetam  500 mg Oral Q T,Th,Sat-1800  . phenytoin  125 mg Oral BID  . saccharomyces boulardii  250 mg Oral BID  . sodium chloride flush  10-40 mL Intracatheter Q12H   Continuous Infusions: . sodium chloride 500 mL (11/21/17 2323)  . sodium chloride 100 mL/hr at 11/20/17 0934     LOS: 17  days        Tawni Millers, MD [facility] Pager [phone]

## 2017-11-25 NOTE — Progress Notes (Signed)
Physical Therapy Treatment Patient Details Name: Erica [name] MRN: [id number] DOB: 1955/12/12 Today's Date: 11/25/2017    History of Present Illness Pt is a 62 y/o female presents with Lft facial, Lft UE and Lft LE twitching at dialysis and admitted with a breakthrough seizure. 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 s/pRIJ HD cath placed 10/7. PMH includes DM with history of DKA, HTN, HLD, CKD, cardiac murmur, chronic diarrhea, nephrolithiasis with history of pyelonephritis, gastroparesis with GERD and a history of recurrent alcohol-associated pancreatitis, and a history of major depression disorder.     PT Comments    Pt requiring mod assistance for transfers and gait training at this time.  Continue to recommend rehab in a post acute setting to improve strength and function.  Pt requires step by step instruction during functional mobility.  Assistance required for turning RW and negotiating obstacles as well.      Follow Up Recommendations  CIR;Supervision for mobility/OOB     Equipment Recommendations  None recommended by PT    Recommendations for Other Services       Precautions / Restrictions Precautions Precautions: Fall Precaution Comments: seizures Restrictions Weight Bearing Restrictions: No    Mobility  Bed Mobility Overal bed mobility: Needs Assistance Bed Mobility: Supine to Sit     Supine to sit: Min assist;HOB elevated     General bed mobility comments: increased time and effort, steadying assist. cues for full completion of task.  Transfers Overall transfer level: Needs assistance Equipment used: Rolling walker (2 wheeled) Transfers: Sit to/from [facility] Sit to Stand: Mod assist         General transfer comment: Pt with poor hand placement required lift assistance to boost into standing.    Ambulation/Gait Ambulation/Gait assistance: Min assist Gait Distance (Feet): 40 Feet(+ 10 ft.   ) Assistive device: Rolling walker (2 wheeled) Gait Pattern/deviations: Step-through pattern;Ataxic;Decreased stride length;Decreased step length - right;Decreased step length - left;Steppage Gait velocity: decreased   General Gait Details: Slow, unsteady and ataxic like gait pattern through LUE/LE; Stays within left side of RW, steppage gait noted on LLE to advance.   Fatigues quickly.     Stairs             Wheelchair Mobility    Modified Rankin (Stroke Patients Only)       Balance Overall balance assessment: Needs assistance   Sitting balance-Leahy Scale: Fair       Standing balance-Leahy Scale: Poor                              Cognition Arousal/Alertness: Awake/alert Behavior During Therapy: Flat affect Overall Cognitive Status: No family/caregiver present to determine baseline cognitive functioning                                        Exercises      General Comments        Pertinent Vitals/Pain Pain Assessment: No/denies pain    Home Living                      Prior Function            PT Goals (current goals can now be found in the care plan section) Acute Rehab PT Goals Patient Stated Goal: to get stronger Potential to Achieve  Goals: Fair Progress towards PT goals: Progressing toward goals    Frequency    Min 4X/week      PT Plan Current plan remains appropriate    Co-evaluation              AM-PAC PT "6 Clicks" Daily Activity  Outcome Measure  Difficulty turning over in bed (including adjusting bedclothes, sheets and blankets)?: A Little Difficulty moving from lying on back to sitting on the side of the bed? : Unable Difficulty sitting down on and standing up from a chair with arms (e.g., wheelchair, bedside commode, etc,.)?: Unable Help needed moving to and from a bed to chair (including a wheelchair)?: A Lot Help needed walking in hospital room?: A Lot Help needed climbing 3-5  steps with a railing? : A Lot 6 Click Score: 11    End of Session Equipment Utilized During Treatment: Gait belt Activity Tolerance: Patient tolerated treatment well Patient left: in chair;with call bell/phone within reach;with chair alarm set Nurse Communication: Mobility status PT Visit Diagnosis: Hemiplegia and hemiparesis;Difficulty in walking, not elsewhere classified (R26.2) Hemiplegia - Right/Left: Left Hemiplegia - dominant/non-dominant: Non-dominant Hemiplegia - caused by: Cerebral infarction     Time: 6283-1517 PT Time Calculation (min) (ACUTE ONLY): 31 min  Charges:  $Gait Training: 8-22 mins $Therapeutic Activity: 8-22 mins                     Governor Rooks, PTA Acute Rehabilitation Services Pager [phone] Office [phone]     Otie Headlee Eli Hose 11/25/2017, 2:20 PM

## 2017-11-26 DIAGNOSIS — N289 Disorder of kidney and ureter, unspecified: Secondary | ICD-10-CM

## 2017-11-26 LAB — GLUCOSE, CAPILLARY
GLUCOSE-CAPILLARY: 248 mg/dL — AB (ref 70–99)
Glucose-Capillary: 208 mg/dL — ABNORMAL HIGH (ref 70–99)
Glucose-Capillary: 276 mg/dL — ABNORMAL HIGH (ref 70–99)
Glucose-Capillary: 345 mg/dL — ABNORMAL HIGH (ref 70–99)

## 2017-11-26 LAB — BASIC METABOLIC PANEL
Anion gap: 8 (ref 5–15)
BUN: 21 mg/dL (ref 8–23)
CALCIUM: 7.9 mg/dL — AB (ref 8.9–10.3)
CO2: 28 mmol/L (ref 22–32)
CREATININE: 3.83 mg/dL — AB (ref 0.44–1.00)
Chloride: 98 mmol/L (ref 98–111)
GFR, EST AFRICAN AMERICAN: 13 mL/min — AB (ref >60)
GFR, EST NON AFRICAN AMERICAN: 12 mL/min — AB (ref >60)
Glucose, Bld: 272 mg/dL — ABNORMAL HIGH (ref 70–99)
Potassium: 4 mmol/L (ref 3.5–5.1)
SODIUM: 134 mmol/L — AB (ref 135–145)

## 2017-11-26 MED ORDER — SODIUM CHLORIDE 0.9 % IV SOLN
125.0000 mg | INTRAVENOUS | Status: DC
  Administered 2017-11-27: 125 mg via INTRAVENOUS
  Filled 2017-11-26 (×2): qty 10

## 2017-11-26 MED ORDER — DARBEPOETIN ALFA 60 MCG/0.3ML IJ SOSY: 60.0000 ug | PREFILLED_SYRINGE | INTRAMUSCULAR | Status: DC

## 2017-11-26 MED ORDER — INSULIN GLARGINE 100 UNIT/ML ~~LOC~~ SOLN
10.0000 [IU] | Freq: Every day | SUBCUTANEOUS | Status: DC
  Administered 2017-11-26 – 2017-11-28 (×2): 10 [IU] via SUBCUTANEOUS
  Filled 2017-11-26 (×5): qty 0.1

## 2017-11-26 NOTE — Progress Notes (Signed)
Physical Therapy Treatment Patient Details Name: Erica [name] MRN: [id number] DOB: 29-Apr-1955 Today's Date: 11/26/2017    History of Present Illness Pt is a 62 y/o female presents with Lft facial, Lft UE and Lft LE twitching at dialysis and admitted with a breakthrough seizure. 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 s/pRIJ HD cath placed 10/7. PMH includes DM with history of DKA, HTN, HLD, CKD, cardiac murmur, chronic diarrhea, nephrolithiasis with history of pyelonephritis, gastroparesis with GERD and a history of recurrent alcohol-associated pancreatitis, and a history of major depression disorder.     PT Comments    Patient progressing slowly towards PT goals. Improved ambulation distance today but still demonstrates ataxia with LLE and uses steppage gait to advance limb. Mod A for standing transfers and min A for balance during gait. Attempted gait training with HHA but balance is better with BUE support. Continues to have impairments in problem solving and slow processing. Eager to maximize independence and function. Will follow.   Follow Up Recommendations  CIR;Supervision for mobility/OOB     Equipment Recommendations  None recommended by PT    Recommendations for Other Services       Precautions / Restrictions Precautions Precautions: Fall Precaution Comments: seizures Restrictions Weight Bearing Restrictions: No    Mobility  Bed Mobility               General bed mobility comments: Up in chair upon PT arrival.   Transfers Overall transfer level: Needs assistance Equipment used: Rolling walker (2 wheeled) Transfers: Sit to/from Stand Sit to Stand: Mod assist         General transfer comment: Assist to power to standing with cues for hand placement/technique. Stood from Automotive engineer.   Ambulation/Gait Ambulation/Gait assistance: Min assist Gait Distance (Feet): 50 Feet Assistive device: Rolling walker (2  wheeled) Gait Pattern/deviations: Step-to pattern;Decreased stride length;Ataxic;Steppage;Decreased step length - right Gait velocity: decreased   General Gait Details: Slow, unsteady and ataxic like gait pattern through LUE/LE; Stays within left side of RW, steppage gait noted on LLE to advance. Attempted gait training with HHA and pt required Mod A for balance.    Stairs             Wheelchair Mobility    Modified Rankin (Stroke Patients Only) Modified Rankin (Stroke Patients Only) Pre-Morbid Rankin Score: Moderate disability Modified Rankin: Moderately severe disability     Balance Overall balance assessment: Needs assistance Sitting-balance support: Feet supported;No upper extremity supported Sitting balance-Leahy Scale: Fair     Standing balance support: During functional activity;Bilateral upper extremity supported Standing balance-Leahy Scale: Poor Standing balance comment: Requires BUE support for standing balance.                             Cognition Arousal/Alertness: Awake/alert Behavior During Therapy: WFL for tasks assessed/performed Overall Cognitive Status: No family/caregiver present to determine baseline cognitive functioning Area of Impairment: Problem solving;Following commands                             Problem Solving: Slow processing General Comments: Difficulty with motor planning, sequencing.      Exercises      General Comments        Pertinent Vitals/Pain Pain Assessment: No/denies pain    Home Living  Prior Function            PT Goals (current goals can now be found in the care plan section) Progress towards PT goals: Progressing toward goals(modestly)    Frequency    Min 4X/week      PT Plan Current plan remains appropriate    Co-evaluation              AM-PAC PT "6 Clicks" Daily Activity  Outcome Measure  Difficulty turning over in bed (including  adjusting bedclothes, sheets and blankets)?: A Little Difficulty moving from lying on back to sitting on the side of the bed? : Unable Difficulty sitting down on and standing up from a chair with arms (e.g., wheelchair, bedside commode, etc,.)?: Unable Help needed moving to and from a bed to chair (including a wheelchair)?: A Lot Help needed walking in hospital room?: A Lot Help needed climbing 3-5 steps with a railing? : A Lot 6 Click Score: 11    End of Session Equipment Utilized During Treatment: Gait belt Activity Tolerance: Patient tolerated treatment well Patient left: in chair;with call bell/phone within reach;with chair alarm set Nurse Communication: Mobility status PT Visit Diagnosis: Hemiplegia and hemiparesis;Difficulty in walking, not elsewhere classified (R26.2) Hemiplegia - Right/Left: Left Hemiplegia - dominant/non-dominant: Non-dominant Hemiplegia - caused by: Cerebral infarction     Time: 7939-0300 PT Time Calculation (min) (ACUTE ONLY): 22 min  Charges:  $Gait Training: 8-22 mins                     Wray Kearns, PT, DPT Acute Rehabilitation Services Pager [phone] Office [place] 11/26/2017, 4:13 PM

## 2017-11-26 NOTE — Progress Notes (Signed)
Accepted at [facility] [place] .1st treatment Tuesday,October 15,2019 at 12:20pm  .Schedule is Tuesday,Thursday,Saturday at 12:20pm

## 2017-11-26 NOTE — Progress Notes (Signed)
Pt's current temp is 101.2 (orally). Pt given Tynelol ; MD page notified.

## 2017-11-26 NOTE — Progress Notes (Signed)
[facility] Progress Note   Subjective:      Fevers overnight  AKI CLIP to [facility] THS 2nd shift  No UOP  Objective Vitals:   11/26/17 0334 11/26/17 0744 11/26/17 1000 11/26/17 1148  BP: 126/65 132/62  139/66  Pulse: 99 91  91  Resp: 20 18  18   Temp: 99.3 F (37.4 C) (!) 101.2 F (38.4 C) 99.2 F (37.3 C) 98.4 F (36.9 C)  TempSrc: Oral Oral  Oral  SpO2: 98% 97%  99%  Weight:      Height:       Physical Exam General: lying comfortably Heart: RRR, no rub Lungs: normal WOB, clear anteriorly Abdomen: soft, nontender Extremities: no LE edema Dialysis Access:  RIJ tunneled HD cath c/d/i  Additional Objective Labs: Basic Metabolic Panel: Recent Labs  Lab 11/22/17 0500 11/24/17 0711 11/26/17 0252  NA 138 133* 134*  K 5.0 4.1 4.0  CL 114* 104 98  CO2 16* 22 28  GLUCOSE 148* 215* 272*  BUN 18 16 21   CREATININE 4.44* 3.69* 3.83*  CALCIUM 7.8* 7.6* 7.9*   Liver Function Tests: Recent Labs  Lab 11/21/17 0735  AST 29  ALT 12  ALKPHOS 93  BILITOT 0.2*  PROT 5.7*  ALBUMIN 1.8*   No results for input(s): LIPASE, AMYLASE in the last 168 hours. CBC: Recent Labs  Lab 11/20/17 0420 11/22/17 0500 11/24/17 0711  WBC 10.5 11.1* 12.1*  HGB 8.2* 7.7* 7.8*  HCT 28.0* 26.2* 26.0*  MCV 93.0 94.9 93.2  PLT 160 210 264   Blood Culture    Component Value Date/Time   SDES BLOOD CENTRAL LINE 11/16/2017 1710   SPECREQUEST  11/16/2017 1710    BOTTLES DRAWN AEROBIC AND ANAEROBIC Blood Culture results may not be optimal due to an excessive volume of blood received in culture bottles   CULT  11/16/2017 1710    NO GROWTH 5 DAYS Performed at [facility] [address], [place], [place] [address]    REPTSTATUS 11/21/2017 FINAL 11/16/2017 1710    Cardiac Enzymes: No results for input(s): CKTOTAL, CKMB, CKMBINDEX, TROPONINI in the last 168 hours. CBG: Recent Labs  Lab 11/25/17 1112 11/25/17 1658 11/25/17 2110 11/26/17 0609 11/26/17 1147   GLUCAP 141* 328* 248* 248* 276*   Iron Studies:  No results for input(s): IRON, TIBC, TRANSFERRIN, FERRITIN in the last 72 hours. @lablastinr3 @ Studies/Results: No results found. Medications: . sodium chloride 500 mL (11/21/17 2323)  . sodium chloride 100 mL/hr at 11/20/17 0934   . sodium chloride   Intravenous Once  . bacitracin   Topical BID  . Chlorhexidine Gluconate Cloth  6 each Topical Q0600  . [START ON 12/02/2017] darbepoetin (ARANESP) injection - DIALYSIS  60 mcg Intravenous Q Thu-HD  . feeding supplement (NEPRO CARB STEADY)  237 mL Oral BID BM  . heparin injection (subcutaneous)  5,000 Units Subcutaneous Q8H  . insulin aspart  0-9 Units Subcutaneous TID WC  . insulin glargine  10 Units Subcutaneous Daily  . levETIRAcetam  1,000 mg Oral Daily  . levETIRAcetam  500 mg Oral Q T,Th,Sat-1800  . phenytoin  125 mg Oral BID  . saccharomyces boulardii  250 mg Oral BID  . sodium chloride flush  10-40 mL Intracatheter Q12H  . topiramate  25 mg Oral BID     Assessment/Plan:  1.  AKI on CKD:    Recent course with emphysematous pyelonephritis treated at [facility], urologic intervention double J stents placed.  During that admission Cr ~  8 on presentation, down to 4 on discharge.    Last known Cr 0.8 in 2017.    Represented 2 weeks later (10/2017) with Cr up to 11.     Has been dialysis dependent since 9/27  Next dialysis tomorrow  CLIP as AKI to [facility] THS 2nd Shift  Using [facility] currently  HD tomorrow: 2K, 1.5L UF, Qb 350, Tight heparin  2.   HTN:   Stable.  No meds  3.  Anemia:  Cont ESA, will start 1/2 load of Fe  4.  BMM: stable  5. Seizures:  H/o hypoglycemia induced seizures; neurology following here and dosing meds for dialysis at this time.  She appears to have improved.    6.  E coli UTI: Completed ceftriaxone.  Has ureteral stents in place from prior hospitalization.  We will follow-up with urology for stent removal.  7.  DM: per primary.   Pearson Grippe  MD  11/26/2017, 12:29 PM  [facility]

## 2017-11-26 NOTE — Progress Notes (Addendum)
PROGRESS NOTE    Erica [name]  YWV:[id number] DOB: [birth year] DOA: 11/08/2017 PCP: Octavio Graves, DO    Brief Narrative:  62 year old female who presented with weakness. She does have significant past medical history of type 2 diabetes mellitus, hypertension and seizure disorder. Recent hospitalization for 12 days, treated for pyelonephritis and acute tubular necrosis.Had ureteral stents placed.Patient complained of lower abdominal pain and dysuria,associated with poor oral intake for the last 2 dayspriorto admission.On the initial physical examination blood pressure 125/69, heart rate 84, respirate 16, temperature 98, oxygen saturation 99%.Moist mucous membranes, lungs clear to auscultation bilaterally, heart S2 present rhythmic, abdomen soft nontender, no lower extremity edema.Sodium 137, potassium 5.3, chloride 113, bicarb 12, glucose 214, BUN 81, creatinine 11.9, white count 15.8, hemoglobin 10.1, hematocrit 32.1, platelets 533.Urinalysis with 100 protein, greater than 50 red cells, greater than 50 white cells, total protein 481, protein creatinine ratio 10.4.  Patient was admitted to the hospital with working diagnosis of acute kidney injury with uremia and anion gap metabolic acidosis.   Assessment & Plan:   Principal Problem:   Renal failure Active Problems:   Diabetes mellitus (HCC)   Metabolic acidosis   Protein-calorie malnutrition, severe (HCC)   Seizure disorder ([place])   Acute renal failure superimposed on chronic kidney disease ([place])   Uncontrolled type 2 diabetes mellitus with hyperglycemia, with long-term current use of insulin (HCC)   Acute pyelonephritis   PICC (peripherally inserted central catheter) in place   AKI (acute kidney injury) ([place])   Chronic diastolic congestive heart failure ([place])   Essential hypertension   1. AKIon CKD stage V. Pending outpatient dialysis facility arrangements. Patient has not recovered renal function. Continue  to follow with nephrology recommendations.   2. Acute pyelonephritis. Ureteral stents, to be followed as outpatient.  3. Seizures. Patient had a breakthrough seizure after starting hemodialysis 09/29. Continue levetiracetam, phenytoin and topiramate 25 mg per neurology recommendations.  4. 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 Continue blood pressure monitoring, patient will be discharged to CIR.   5. T2DM. On insulin sliding scale for glucose cover and monitoring, patient is tolerating po well. Capillary glucose 328. 248, 276. With fasting glucose 272. Will add 10 units of basal insulin with glargine and continue close monitoring.   6, HTN. Stable blood pressure, patient off antihypertensive agents.   7. Severe calorie protein-malnutrition. On nutritional supplements.   8. Anemia of chronic renal disease. sp 1 unit PRBC transfusion. Iron panel with ferritin 382, iron 47, TIBC 160 and transferrin saturation 29. On Aranesp.    9. New fever. t max  101.2 this am. No signs of systemic infection, will add incentive spirometer and will order to be out of be to the chair for mobility. Continue to follow on temperature curve, will check wbc in am.   DVT prophylaxis:heparin Code Status:full Family Communication:no family at the bedside Disposition Plan/ discharge barriers:patient will need to be afebrile for 24 hours before discharge.    Consultants:  Nephrology  Procedures:    Antimicrobials:   Subjective: Patient noted to have fever spikes over last 24 hours, no chest pain, dyspnea or cough, no skin rashes. Reports pain at the insertion of HC catheter.   Objective: Vitals:   11/26/17 0334 11/26/17 0744 11/26/17 1000 11/26/17 1148  BP: 126/65 132/62  139/66  Pulse: 99 91  91  Resp: 20 18  18   Temp: 99.3 F (37.4 C) (!) 101.2 F (38.4 C) 99.2  F (37.3 C) 98.4 F (36.9 C)  TempSrc: Oral  Oral  Oral  SpO2: 98% 97%  99%  Weight:      Height:        Intake/Output Summary (Last 24 hours) at 11/26/2017 1159 Last data filed at 11/25/2017 2200 Gross per 24 hour  Intake 340 ml  Output -  Net 340 ml   Filed Weights   11/24/17 0700 11/24/17 1410 11/24/17 1720  Weight: 52.3 kg 50.3 kg 48.8 kg    Examination:   General: deconditioned  Neurology: Awake and alert, non focal  E ENT: no pallor, no icterus, oral mucosa moist Cardiovascular: No JVD. S1-S2 present, rhythmic, no gallops, rubs, or murmurs. No lower extremity edema. Pulmonary: positive breath sounds bilaterally, adequate air movement, no wheezing, rhonchi or rales. Gastrointestinal. Abdomen with no organomegaly, non tender, no rebound or guarding Skin. No rashes. Right IJ HD catheter, with no local erythema or edema, no significant tenderness.  Musculoskeletal: no joint deformities     Data Reviewed: I have personally reviewed following labs and imaging studies  CBC: Recent Labs  Lab 11/20/17 0420 11/22/17 0500 11/24/17 0711  WBC 10.5 11.1* 12.1*  HGB 8.2* 7.7* 7.8*  HCT 28.0* 26.2* 26.0*  MCV 93.0 94.9 93.2  PLT 160 210 416   Basic Metabolic Panel: Recent Labs  Lab 11/20/17 0420 11/21/17 0735 11/22/17 0500 11/24/17 0711 11/26/17 0252  NA 140 136 138 133* 134*  K 4.5 4.7 5.0 4.1 4.0  CL 113* 112* 114* 104 98  CO2 20* 17* 16* 22 28  GLUCOSE 167* 163* 148* 215* 272*  BUN 8 14 18 16 21   CREATININE 3.27* 3.95* 4.44* 3.69* 3.83*  CALCIUM 7.8* 7.8* 7.8* 7.6* 7.9*   GFR: Estimated Creatinine Clearance: 10.8 mL/min (A) (by C-G formula based on SCr of 3.83 mg/dL (H)). Liver Function Tests: Recent Labs  Lab 11/21/17 0735  AST 29  ALT 12  ALKPHOS 93  BILITOT 0.2*  PROT 5.7*  ALBUMIN 1.8*   No results for input(s): LIPASE, AMYLASE in the last 168 hours. No results for input(s): AMMONIA in the last 168 hours. Coagulation Profile: Recent Labs  Lab 11/22/17 0743  INR 1.41   Cardiac  Enzymes: No results for input(s): CKTOTAL, CKMB, CKMBINDEX, TROPONINI in the last 168 hours. BNP (last 3 results) No results for input(s): PROBNP in the last 8760 hours. HbA1C: No results for input(s): HGBA1C in the last 72 hours. CBG: Recent Labs  Lab 11/25/17 1112 11/25/17 1658 11/25/17 2110 11/26/17 0609 11/26/17 1147  GLUCAP 141* 328* 248* 248* 276*   Lipid Profile: No results for input(s): CHOL, HDL, LDLCALC, TRIG, CHOLHDL, LDLDIRECT in the last 72 hours. Thyroid Function Tests: No results for input(s): TSH, T4TOTAL, FREET4, T3FREE, THYROIDAB in the last 72 hours. Anemia Panel: No results for input(s): VITAMINB12, FOLATE, FERRITIN, TIBC, IRON, RETICCTPCT in the last 72 hours.    Radiology Studies: I have reviewed all of the imaging during this hospital visit personally     Scheduled Meds: . sodium chloride   Intravenous Once  . bacitracin   Topical BID  . Chlorhexidine Gluconate Cloth  6 each Topical Q0600  . [START ON 12/02/2017] darbepoetin (ARANESP) injection - DIALYSIS  60 mcg Intravenous Q Thu-HD  . feeding supplement (NEPRO CARB STEADY)  237 mL Oral BID BM  . heparin injection (subcutaneous)  5,000 Units Subcutaneous Q8H  . insulin aspart  0-9 Units Subcutaneous TID WC  . levETIRAcetam  1,000 mg Oral Daily  .  levETIRAcetam  500 mg Oral Q T,Th,Sat-1800  . phenytoin  125 mg Oral BID  . saccharomyces boulardii  250 mg Oral BID  . sodium chloride flush  10-40 mL Intracatheter Q12H  . topiramate  25 mg Oral BID   Continuous Infusions: . sodium chloride 500 mL (11/21/17 2323)  . sodium chloride 100 mL/hr at 11/20/17 0934     LOS: 18 days        Mauricio Gerome Apley, MD [facility] Pager [phone]

## 2017-11-27 LAB — CBC WITH DIFFERENTIAL/PLATELET
Abs Immature Granulocytes: 0.03 10*3/uL (ref 0.00–0.07)
BASOS ABS: 0 10*3/uL (ref 0.0–0.1)
BASOS PCT: 0 %
EOS ABS: 0.3 10*3/uL (ref 0.0–0.5)
EOS PCT: 4 %
HEMATOCRIT: 27.6 % — AB (ref 36.0–46.0)
Hemoglobin: 8.4 g/dL — ABNORMAL LOW (ref 12.0–15.0)
Immature Granulocytes: 0 %
LYMPHS ABS: 1.9 10*3/uL (ref 0.7–4.0)
Lymphocytes Relative: 21 %
MCH: 28.4 pg (ref 26.0–34.0)
MCHC: 30.4 g/dL (ref 30.0–36.0)
MCV: 93.2 fL (ref 80.0–100.0)
Monocytes Absolute: 1 10*3/uL (ref 0.1–1.0)
Monocytes Relative: 12 %
NEUTROS PCT: 63 %
NRBC: 0.3 % — AB (ref 0.0–0.2)
Neutro Abs: 5.6 10*3/uL (ref 1.7–7.7)
PLATELETS: 299 10*3/uL (ref 150–400)
RBC: 2.96 MIL/uL — ABNORMAL LOW (ref 3.87–5.11)
RDW: 20.7 % — AB (ref 11.5–15.5)
WBC: 8.9 10*3/uL (ref 4.0–10.5)

## 2017-11-27 LAB — GLUCOSE, CAPILLARY
GLUCOSE-CAPILLARY: 159 mg/dL — AB (ref 70–99)
Glucose-Capillary: 124 mg/dL — ABNORMAL HIGH (ref 70–99)
Glucose-Capillary: 78 mg/dL (ref 70–99)

## 2017-11-27 LAB — BASIC METABOLIC PANEL
Anion gap: 10 (ref 5–15)
BUN: 30 mg/dL — ABNORMAL HIGH (ref 8–23)
CALCIUM: 8.1 mg/dL — AB (ref 8.9–10.3)
CO2: 24 mmol/L (ref 22–32)
CREATININE: 4.68 mg/dL — AB (ref 0.44–1.00)
Chloride: 99 mmol/L (ref 98–111)
GFR, EST AFRICAN AMERICAN: 11 mL/min — AB (ref >60)
GFR, EST NON AFRICAN AMERICAN: 9 mL/min — AB (ref >60)
GLUCOSE: 190 mg/dL — AB (ref 70–99)
Potassium: 3.7 mmol/L (ref 3.5–5.1)
Sodium: 133 mmol/L — ABNORMAL LOW (ref 135–145)

## 2017-11-27 MED ORDER — HEPARIN SODIUM (PORCINE) 1000 UNIT/ML IJ SOLN
INTRAMUSCULAR | Status: AC
  Administered 2017-11-27: 3200 [IU]
  Filled 2017-11-27: qty 4

## 2017-11-27 MED ORDER — HEPARIN SODIUM (PORCINE) 1000 UNIT/ML IJ SOLN
INTRAMUSCULAR | Status: AC
  Administered 2017-11-27: 1000 [IU] via INTRAVENOUS_CENTRAL
  Filled 2017-11-27: qty 1

## 2017-11-27 MED ORDER — HEPARIN SODIUM (PORCINE) 1000 UNIT/ML IJ SOLN
INTRAMUSCULAR | Status: AC
  Filled 2017-11-27: qty 1

## 2017-11-27 MED ORDER — HEPARIN SODIUM (PORCINE) 1000 UNIT/ML DIALYSIS
20.0000 [IU]/kg | INTRAMUSCULAR | Status: DC | PRN
  Administered 2017-11-27: 1000 [IU] via INTRAVENOUS_CENTRAL
  Filled 2017-11-27: qty 1

## 2017-11-27 NOTE — Plan of Care (Signed)
  Problem: Pain Managment: Goal: General experience of comfort will improve Outcome: Progressing   Problem: Clinical Measurements: Goal: Ability to maintain clinical measurements within normal limits will improve Outcome: Progressing   

## 2017-11-27 NOTE — Progress Notes (Signed)
[place] for phenytoin dosing Indication: Patient safety/ therapeutic monitoring  Allergies  Allergen Reactions  . Aspirin Palpitations    Patient Measurements: Height: 5' (152.4 cm) Weight: 105 lb 6.1 oz (47.8 kg) IBW/kg (Calculated) : 45.5   Vital Signs: Temp: 97.3 F (36.3 C) (10/12 0715) Temp Source: Oral (10/12 0715) BP: 153/82 (10/12 0930) Pulse Rate: 87 (10/12 0930) Intake/Output from previous day: 10/11 0701 - 10/12 0700 In: 360 [P.O.:360] Out: 2 [Urine:1; Stool:1] Intake/Output from this shift: No intake/output data recorded.  Labs: Recent Labs    11/26/17 0252 11/27/17 0401  WBC  --  8.9  HGB  --  8.4*  HCT  --  27.6*  PLT  --  299  CREATININE 3.83* 4.68*   Estimated Creatinine Clearance: 8.8 mL/min (A) (by C-G formula based on SCr of 4.68 mg/dL (H)).    Medical History: Past Medical History:  Diagnosis Date  . Alcohol-induced pancreatitis   . Chronic diarrhea   . Depression   . Diabetes mellitus    fasting blood sugar 110-120s  . Diastolic CHF ([place])   . DKA (diabetic ketoacidoses) ([place])   . Gastroparesis   . GERD (gastroesophageal reflux disease)   . Heart murmur   . History of kidney stones   . Hyperlipidemia   . Hypertension   . Hypokalemia   . Muscle spasm   . Neuropathic pain   . Neuropathy    Hx: of  . Pyelonephritis   . Recurrent pancreatitis   . Seizures ([place])   . Vitamin B12 deficiency   . Vitamin D deficiency     Medications:  Scheduled:  . sodium chloride   Intravenous Once  . bacitracin   Topical BID  . Chlorhexidine Gluconate Cloth  6 each Topical Q0600  . [START ON 12/02/2017] darbepoetin (ARANESP) injection - DIALYSIS  60 mcg Intravenous Q Thu-HD  . feeding supplement (NEPRO CARB STEADY)  237 mL Oral BID BM  . heparin      . heparin injection (subcutaneous)  5,000 Units Subcutaneous Q8H  . insulin aspart  0-9 Units Subcutaneous TID WC  . insulin glargine  10 Units  Subcutaneous Daily  . levETIRAcetam  1,000 mg Oral Daily  . levETIRAcetam  500 mg Oral Q T,Th,Sat-1800  . phenytoin  125 mg Oral BID  . saccharomyces boulardii  250 mg Oral BID  . sodium chloride flush  10-40 mL Intracatheter Q12H  . topiramate  25 mg Oral BID    Assessment: 62 yo F recently initiated on dialysis started on phenytoin inpatient for breakthrough seizures. Neurology initially followed pt & wanted pharmacy to take over dosing for phenytoin. Pt's dose was reduced from 100mg  q8h to 125mg  q12h on 10/2.   Last total phenytoin level of 7.9 corrected to 17.2 with low albumin in ESRD. No level since this point. No further seizures noted.     Plan:  Continue phenytoin 125mg  q 12h Corrected phenytoin level goal = 10-20 Monitor for s/s of phenytoin toxicity   Markeshia Giebel A. Levada Dy, PharmD, [place] Pager: [phone] Please utilize Amion for appropriate phone number to reach the unit pharmacist ([place])   11/27/2017 10:42 AM

## 2017-11-27 NOTE — Progress Notes (Signed)
PROGRESS NOTE    AUDRA KAGEL  AJG:[id number] DOB: 1955-06-24 DOA: 11/08/2017 PCP: Octavio Graves, DO    Brief Narrative:  62 year old female who presented with weakness. She does have significant past medical history of type 2 diabetes mellitus, hypertension and seizure disorder. Recent hospitalization for 12 days, treated for pyelonephritis and acute tubular necrosis.Had ureteral stents placed.Patient complained of lower abdominal pain and dysuria,associated with poor oral intake for the last 2 dayspriorto admission.On the initial physical examination blood pressure 125/69, heart rate 84, respirate 16, temperature 98, oxygen saturation 99%.Moist mucous membranes, lungs clear to auscultation bilaterally, heart S2 present rhythmic, abdomen soft nontender, no lower extremity edema.Sodium 137, potassium 5.3, chloride 113, bicarb 12, glucose 214, BUN 81, creatinine 11.9, white count 15.8, hemoglobin 10.1, hematocrit 32.1, platelets 533.Urinalysis with 100 protein, greater than 50 red cells, greater than 50 white cells, total protein 481, protein creatinine ratio 10.4.  Patient was admitted to the hospital with working diagnosis of acute kidney injury with uremia and anion gap metabolic acidosis.   Assessment & Plan:   Principal Problem:   Renal failure Active Problems:   Diabetes mellitus (HCC)   Metabolic acidosis   Protein-calorie malnutrition, severe (HCC)   Seizure disorder ([place])   Acute renal failure superimposed on chronic kidney disease ([place])   Uncontrolled type 2 diabetes mellitus with hyperglycemia, with long-term current use of insulin (HCC)   Acute pyelonephritis   PICC (peripherally inserted central catheter) in place   AKI (acute kidney injury) ([place])   Chronic diastolic congestive heart failure ([place])   Essential hypertension   1. AKIon CKD stage V.Patient accepted at [facility] 1st treatment form October 15. Had HD today, with no  major complications. Clinically euvolemic.   2. Acute pyelonephritis.  Patient has bilateral stents, October 07, 2017 to be followed as outpatient with Dr. Vernard Gambles.   3. Seizures.Patient had a breakthrough seizure after starting hemodialysis 09/29. Tolerating welllevetiracetam, phenytoin and topiramate.   4. Acute CVA/ scattered punctate infarction within the deep white matter in both frontoparietalvertices.Pending CIR transfer. Continue blood pressure monitoring.  5. T2DM. Improved glycemia with fasting glucose this am at 190 mg/dl. capilary glucose 276, 345, 208, 124 and 78. Patient tolerating po well, will continue glucose cover and monitoring with insulin sliding scale. Next step to add pre-meal insulin therapy.   6, HTN. Patient is off antihypertensive medications, blood pressure systolic 355 to 974 mmHg.    7. Severe calorie protein-malnutrition. Continue with nutritional supplements.  8. Anemia of chronic renal disease. sp 1 unit PRBC transfusion. stable hb and hct, continue Aranesp/   9. New fever. Patient has remained afebrile for the last 24 hours, will continue mobilization and temperature curve monitoring while in the hospital. No signs of deep infection, continue to hold on antibiotic therapy.  DVT prophylaxis:heparin Code Status:full Family Communication:no family at the bedside Disposition Plan/ discharge barriers:pending transfer to CIR.   Consultants:  Nephrology  Procedures:     Subjective: Patient tolerated HD well, no chest pain or dyspnea, no nausea or vomiting, has been afebrile for the last 24 hours.   Objective: Vitals:   11/27/17 0800 11/27/17 0830 11/27/17 0900 11/27/17 0930  BP: (!) 159/82 (!) 155/80 (!) 163/88 (!) 153/82  Pulse: 86 83 85 87  Resp:      Temp:      TempSrc:      SpO2:      Weight:      Height:  Intake/Output Summary (Last 24 hours) at 11/27/2017 1147 Last data filed at 11/27/2017 0232 Gross  per 24 hour  Intake 360 ml  Output 2 ml  Net 358 ml   Filed Weights   11/24/17 1410 11/24/17 1720 11/27/17 0715  Weight: 50.3 kg 48.8 kg 47.8 kg    Examination:   General: weak and deconditioned  Neurology: Awake and alert, non focal  E ENT: mild pallor, no icterus, oral mucosa moist Cardiovascular: No JVD. S1-S2 present, rhythmic, no gallops, rubs, or murmurs. No lower extremity edema. Pulmonary: vesicular breath sounds bilaterally, adequate air movement, no wheezing, rhonchi or rales. Gastrointestinal. Abdomen with, no organomegaly, non tender, no rebound or guarding Skin. No rashes Musculoskeletal: no joint deformities HD access right IJ HD catheter.     Data Reviewed: I have personally reviewed following labs and imaging studies  CBC: Recent Labs  Lab 11/22/17 0500 11/24/17 0711 11/27/17 0401  WBC 11.1* 12.1* 8.9  NEUTROABS  --   --  5.6  HGB 7.7* 7.8* 8.4*  HCT 26.2* 26.0* 27.6*  MCV 94.9 93.2 93.2  PLT 210 264 527   Basic Metabolic Panel: Recent Labs  Lab 11/21/17 0735 11/22/17 0500 11/24/17 0711 11/26/17 0252 11/27/17 0401  NA 136 138 133* 134* 133*  K 4.7 5.0 4.1 4.0 3.7  CL 112* 114* 104 98 99  CO2 17* 16* 22 28 24   GLUCOSE 163* 148* 215* 272* 190*  BUN 14 18 16 21  30*  CREATININE 3.95* 4.44* 3.69* 3.83* 4.68*  CALCIUM 7.8* 7.8* 7.6* 7.9* 8.1*   GFR: Estimated Creatinine Clearance: 8.8 mL/min (A) (by C-G formula based on SCr of 4.68 mg/dL (H)). Liver Function Tests: Recent Labs  Lab 11/21/17 0735  AST 29  ALT 12  ALKPHOS 93  BILITOT 0.2*  PROT 5.7*  ALBUMIN 1.8*   No results for input(s): LIPASE, AMYLASE in the last 168 hours. No results for input(s): AMMONIA in the last 168 hours. Coagulation Profile: Recent Labs  Lab 11/22/17 0743  INR 1.41   Cardiac Enzymes: No results for input(s): CKTOTAL, CKMB, CKMBINDEX, TROPONINI in the last 168 hours. BNP (last 3 results) No results for input(s): PROBNP in the last 8760  hours. HbA1C: No results for input(s): HGBA1C in the last 72 hours. CBG: Recent Labs  Lab 11/26/17 0609 11/26/17 1147 11/26/17 1531 11/26/17 2330 11/27/17 0634  GLUCAP 248* 276* 345* 208* 124*   Lipid Profile: No results for input(s): CHOL, HDL, LDLCALC, TRIG, CHOLHDL, LDLDIRECT in the last 72 hours. Thyroid Function Tests: No results for input(s): TSH, T4TOTAL, FREET4, T3FREE, THYROIDAB in the last 72 hours. Anemia Panel: No results for input(s): VITAMINB12, FOLATE, FERRITIN, TIBC, IRON, RETICCTPCT in the last 72 hours.    Radiology Studies: I have reviewed all of the imaging during this hospital visit personally     Scheduled Meds: . heparin      . sodium chloride   Intravenous Once  . bacitracin   Topical BID  . Chlorhexidine Gluconate Cloth  6 each Topical Q0600  . [START ON 12/02/2017] darbepoetin (ARANESP) injection - DIALYSIS  60 mcg Intravenous Q Thu-HD  . feeding supplement (NEPRO CARB STEADY)  237 mL Oral BID BM  . heparin injection (subcutaneous)  5,000 Units Subcutaneous Q8H  . insulin aspart  0-9 Units Subcutaneous TID WC  . insulin glargine  10 Units Subcutaneous Daily  . levETIRAcetam  1,000 mg Oral Daily  . levETIRAcetam  500 mg Oral Q T,Th,Sat-1800  . phenytoin  125 mg Oral  BID  . saccharomyces boulardii  250 mg Oral BID  . sodium chloride flush  10-40 mL Intracatheter Q12H  . topiramate  25 mg Oral BID   Continuous Infusions: . sodium chloride 500 mL (11/21/17 2323)  . sodium chloride 100 mL/hr at 11/20/17 0934  . ferric gluconate (FERRLECIT/NULECIT) IV 125 mg (11/27/17 1100)     LOS: 19 days        Mauricio Gerome Apley, MD [facility] Pager [phone]

## 2017-11-27 NOTE — Procedures (Signed)
I was present at this dialysis session. I have reviewed the session itself and made appropriate changes.   4K. 1.5L UF goal.  Qb 350. BP stable. Pt w/o c/o.  No interval events. Starting Fe.    Filed Weights   11/24/17 1410 11/24/17 1720 11/27/17 0715  Weight: 50.3 kg 48.8 kg 47.8 kg    Recent Labs  Lab 11/27/17 0401  NA 133*  K 3.7  CL 99  CO2 24  GLUCOSE 190*  BUN 30*  CREATININE 4.68*  CALCIUM 8.1*    Recent Labs  Lab 11/22/17 0500 11/24/17 0711 11/27/17 0401  WBC 11.1* 12.1* 8.9  NEUTROABS  --   --  5.6  HGB 7.7* 7.8* 8.4*  HCT 26.2* 26.0* 27.6*  MCV 94.9 93.2 93.2  PLT 210 264 299    Scheduled Meds: . heparin      . sodium chloride   Intravenous Once  . bacitracin   Topical BID  . Chlorhexidine Gluconate Cloth  6 each Topical Q0600  . [START ON 12/02/2017] darbepoetin (ARANESP) injection - DIALYSIS  60 mcg Intravenous Q Thu-HD  . feeding supplement (NEPRO CARB STEADY)  237 mL Oral BID BM  . heparin injection (subcutaneous)  5,000 Units Subcutaneous Q8H  . insulin aspart  0-9 Units Subcutaneous TID WC  . insulin glargine  10 Units Subcutaneous Daily  . levETIRAcetam  1,000 mg Oral Daily  . levETIRAcetam  500 mg Oral Q T,Th,Sat-1800  . phenytoin  125 mg Oral BID  . saccharomyces boulardii  250 mg Oral BID  . sodium chloride flush  10-40 mL Intracatheter Q12H  . topiramate  25 mg Oral BID   Continuous Infusions: . sodium chloride 500 mL (11/21/17 2323)  . sodium chloride 100 mL/hr at 11/20/17 0934  . ferric gluconate (FERRLECIT/NULECIT) IV     PRN Meds:.sodium chloride, acetaminophen **OR** acetaminophen, heparin, loperamide, ondansetron **OR** ondansetron (ZOFRAN) IV, RESOURCE THICKENUP CLEAR, senna-docusate, sodium chloride flush   Pearson Grippe  MD 11/27/2017, 8:14 AM

## 2017-11-28 LAB — GLUCOSE, CAPILLARY
GLUCOSE-CAPILLARY: 166 mg/dL — AB (ref 70–99)
Glucose-Capillary: 151 mg/dL — ABNORMAL HIGH (ref 70–99)
Glucose-Capillary: 172 mg/dL — ABNORMAL HIGH (ref 70–99)

## 2017-11-28 NOTE — Progress Notes (Signed)
Erica [name] Progress Note   Subjective:      No new events  Afebrile  AKI CLIP to [facility]S 2nd shift  Awaiting disposition  Feels well today, family to visit  2 unmeasured voids yesterday  Objective Vitals:   11/27/17 2001 11/27/17 2256 11/28/17 0315 11/28/17 0853  BP: (!) 148/71 134/64 (!) 158/78 138/76  Pulse: 96 92 88 87  Resp: 18 18 16 16   Temp: 99.1 F (37.3 C) 99.5 F (37.5 C) 98.2 F (36.8 C) 97.7 F (36.5 C)  TempSrc: Oral Oral Oral Oral  SpO2: 97% 98% 99% 97%  Weight:      Height:       Physical Exam General: lying comfortably Heart: RRR, no rub Lungs: normal WOB, clear anteriorly Abdomen: soft, nontender Extremities: no LE edema Dialysis Access:  RIJ tunneled HD cath c/d/i  Additional Objective Labs: Basic Metabolic Panel: Recent Labs  Lab 11/24/17 0711 11/26/17 0252 11/27/17 0401  NA 133* 134* 133*  K 4.1 4.0 3.7  CL 104 98 99  CO2 22 28 24   GLUCOSE 215* 272* 190*  BUN 16 21 30*  CREATININE 3.69* 3.83* 4.68*  CALCIUM 7.6* 7.9* 8.1*   Liver Function Tests: No results for input(s): AST, ALT, ALKPHOS, BILITOT, PROT, ALBUMIN in the last 168 hours. No results for input(s): LIPASE, AMYLASE in the last 168 hours. CBC: Recent Labs  Lab 11/22/17 0500 11/24/17 0711 11/27/17 0401  WBC 11.1* 12.1* 8.9  NEUTROABS  --   --  5.6  HGB 7.7* 7.8* 8.4*  HCT 26.2* 26.0* 27.6*  MCV 94.9 93.2 93.2  PLT 210 264 299   Blood Culture    Component Value Date/Time   SDES BLOOD CENTRAL LINE 11/16/2017 1710   SPECREQUEST  11/16/2017 1710    BOTTLES DRAWN AEROBIC AND ANAEROBIC Blood Culture results may not be optimal due to an excessive volume of blood received in culture bottles   CULT  11/16/2017 1710    NO GROWTH 5 DAYS Performed at [facility], Erica [name] [address] [place], [place] [address]    REPTSTATUS 11/21/2017 FINAL 11/16/2017 1710    Cardiac Enzymes: No results for input(s): CKTOTAL, CKMB, CKMBINDEX, TROPONINI in the  last 168 hours. CBG: Recent Labs  Lab 11/26/17 2330 11/27/17 0634 11/27/17 1147 11/27/17 1639 11/28/17 0622  GLUCAP 208* 124* 78 159* 166*   Iron Studies:  No results for input(s): IRON, TIBC, TRANSFERRIN, FERRITIN in the last 72 hours. @lablastinr3 @ Studies/Results: No results found. Medications: . sodium chloride 500 mL (11/21/17 2323)  . ferric gluconate (FERRLECIT/NULECIT) IV 125 mg (11/27/17 1100)   . sodium chloride   Intravenous Once  . bacitracin   Topical BID  . Chlorhexidine Gluconate Cloth  6 each Topical Q0600  . [START ON 12/02/2017] darbepoetin (ARANESP) injection - DIALYSIS  60 mcg Intravenous Q Thu-HD  . feeding supplement (NEPRO CARB STEADY)  237 mL Oral BID BM  . heparin injection (subcutaneous)  5,000 Units Subcutaneous Q8H  . insulin aspart  0-9 Units Subcutaneous TID WC  . insulin glargine  10 Units Subcutaneous Daily  . levETIRAcetam  1,000 mg Oral Daily  . levETIRAcetam  500 mg Oral Q T,Th,Sat-1800  . phenytoin  125 mg Oral BID  . saccharomyces boulardii  250 mg Oral BID  . sodium chloride flush  10-40 mL Intracatheter Q12H  . topiramate  25 mg Oral BID     Assessment/Plan:  1.  AKI on CKD:    Recent course with emphysematous  pyelonephritis treated at [facility], urologic intervention double J stents placed.  During that admission Cr ~ 8 on presentation, down to 4 on discharge.    Last known Cr 0.8 in 2017.    Represented 2 weeks later (10/2017) with Cr up to 11.     Has been dialysis dependent since 9/27  Continues inpatient dialysis on THS schedule  CLIP as AKI to [facility] 2nd Shift   Using [facility] currently  2.   HTN:   Stable.  No meds  3.  Anemia:  Cont ESA, will start 1/2 load of Fe  4.  BMM: stable  5. Seizures:  H/o hypoglycemia induced seizures; neurology following here and dosing meds for dialysis at this time.  She appears to have improved.    6.  E coli UTI: Completed ceftriaxone.  Has ureteral stents in place from prior  hospitalization.  Will follow-up with urology for stent removal.  7.  DM: per primary.   [name]  11/28/2017, 11:49 AM  Erica [name]

## 2017-11-28 NOTE — Progress Notes (Signed)
PROGRESS NOTE    Erica [name]  YQM:[id number] DOB: 23-Jul-1955 DOA: 11/08/2017 PCP: Octavio Graves, DO    Brief Narrative:  62 year old female who presented with weakness. She does have significant past medical history of type 2 diabetes mellitus, hypertension and seizure disorder. Recent hospitalization for 12 days, treated for pyelonephritis and acute tubular necrosis.Had ureteral stents placed.Patient complained of lower abdominal pain and dysuria,associated with poor oral intake for the last 2 dayspriorto admission.On the initial physical examination blood pressure 125/69, heart rate 84, respirate 16, temperature 98, oxygen saturation 99%.Moist mucous membranes, lungs clear to auscultation bilaterally, heart S2 present rhythmic, abdomen soft nontender, no lower extremity edema.Sodium 137, potassium 5.3, chloride 113, bicarb 12, glucose 214, BUN 81, creatinine 11.9, white count 15.8, hemoglobin 10.1, hematocrit 32.1, platelets 533.Urinalysis with 100 protein, greater than 50 red cells, greater than 50 white cells, total protein 481, protein creatinine ratio 10.4.  Patient was admitted to the hospital with working diagnosis of acute kidney injury with uremia and anion gap metabolic acidosis.   Assessment & Plan:   Principal Problem:   Renal failure Active Problems:   Diabetes mellitus (HCC)   Metabolic acidosis   Protein-calorie malnutrition, severe (HCC)   Seizure disorder ([place])   Acute renal failure superimposed on chronic kidney disease ([place])   Uncontrolled type 2 diabetes mellitus with hyperglycemia, with long-term current use of insulin (HCC)   Acute pyelonephritis   PICC (peripherally inserted central catheter) in place   AKI (acute kidney injury) ([place])   Chronic diastolic congestive heart failure ([place])   Essential hypertension  1. AKIon CKDstage V.Patient accepted at [facility] 1st treatment form October 15.  Today clinically euvolemic,  continue to follow with nephrology recommendations. Patient has not recovered her renal function yet.   2. Acute pyelonephritis.  Patient has bilateral stents, October 07, 2017 to be followed as outpatient with Dr. Vernard Gambles. Patient has remained afebrile, no signs of recurrent infection, will need outpatient follow up.   3. Seizures.Patient had a breakthrough seizure after starting hemodialysis 09/29.Continue regimen withlevetiracetam,phenytoinand topiramate.   4. 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   5. T2DM.Capilary glucose 159, 166, 151. Continue basal insulin with 10 units of levimir. Continue glucose cover and monitoring with sliding scale.   6, HTN.Controlled blood pressure with no antihypertensive agents, continue ultrafiltration on HD per nephrology recommendations.  7. Severe calorie protein-malnutrition.Onnutritional supplements.  8. Anemiaof chronic renal disease.sp 1 unit PRBC transfusion.On Aranesp.   9.Fever.Patient continue to be afebrile, will continue temperature curve monitoring.  DVT prophylaxis:heparin Code Status:full Family Communication:no family at the bedside Disposition Plan/ discharge barriers:pending transfer to CIR/ snf.   Consultants:  Nephrology  Procedures:     Subjective: Patient is feeling better, no dyspnea, no chest pain, no nausea or vomiting.   Objective: Vitals:   11/27/17 2001 11/27/17 2256 11/28/17 0315 11/28/17 0853  BP: (!) 148/71 134/64 (!) 158/78 138/76  Pulse: 96 92 88 87  Resp: 18 18 16 16   Temp: 99.1 F (37.3 C) 99.5 F (37.5 C) 98.2 F (36.8 C) 97.7 F (36.5 C)  TempSrc: Oral Oral Oral Oral  SpO2: 97% 98% 99% 97%  Weight:      Height:        Intake/Output Summary (Last 24 hours) at 11/28/2017 1153 Last data filed at 11/28/2017 0600 Gross per 24 hour    Intake 240 ml  Output -  Net  240 ml   Filed Weights   11/24/17 1410 11/24/17 1720 11/27/17 0715  Weight: 50.3 kg 48.8 kg 47.8 kg    Examination:   General: Not in pain or dyspnea, deconditioned  Neurology: Awake and alert, non focal  E ENT: mild pallor, no icterus, oral mucosa moist Cardiovascular: No JVD. S1-S2 present, rhythmic, no gallops, rubs, or murmurs. No lower extremity edema. Pulmonary: positive breath sounds bilaterally, adequate air movement, no wheezing, rhonchi or rales. Gastrointestinal. Abdomen with no organomegaly, non tender, no rebound or guarding Skin. No rashes Musculoskeletal: no joint deformities     Data Reviewed: I have personally reviewed following labs and imaging studies  CBC: Recent Labs  Lab 11/22/17 0500 11/24/17 0711 11/27/17 0401  WBC 11.1* 12.1* 8.9  NEUTROABS  --   --  5.6  HGB 7.7* 7.8* 8.4*  HCT 26.2* 26.0* 27.6*  MCV 94.9 93.2 93.2  PLT 210 264 768   Basic Metabolic Panel: Recent Labs  Lab 11/22/17 0500 11/24/17 0711 11/26/17 0252 11/27/17 0401  NA 138 133* 134* 133*  K 5.0 4.1 4.0 3.7  CL 114* 104 98 99  CO2 16* 22 28 24   GLUCOSE 148* 215* 272* 190*  BUN 18 16 21  30*  CREATININE 4.44* 3.69* 3.83* 4.68*  CALCIUM 7.8* 7.6* 7.9* 8.1*   GFR: Estimated Creatinine Clearance: 8.8 mL/min (A) (by C-G formula based on SCr of 4.68 mg/dL (H)). Liver Function Tests: No results for input(s): AST, ALT, ALKPHOS, BILITOT, PROT, ALBUMIN in the last 168 hours. No results for input(s): LIPASE, AMYLASE in the last 168 hours. No results for input(s): AMMONIA in the last 168 hours. Coagulation Profile: Recent Labs  Lab 11/22/17 0743  INR 1.41   Cardiac Enzymes: No results for input(s): CKTOTAL, CKMB, CKMBINDEX, TROPONINI in the last 168 hours. BNP (last 3 results) No results for input(s): PROBNP in the last 8760 hours. HbA1C: No results for input(s): HGBA1C in the last 72 hours. CBG: Recent Labs  Lab 11/26/17 2330  11/27/17 0634 11/27/17 1147 11/27/17 1639 11/28/17 0622  GLUCAP 208* 124* 78 159* 166*   Lipid Profile: No results for input(s): CHOL, HDL, LDLCALC, TRIG, CHOLHDL, LDLDIRECT in the last 72 hours. Thyroid Function Tests: No results for input(s): TSH, T4TOTAL, FREET4, T3FREE, THYROIDAB in the last 72 hours. Anemia Panel: No results for input(s): VITAMINB12, FOLATE, FERRITIN, TIBC, IRON, RETICCTPCT in the last 72 hours.    Radiology Studies: I have reviewed all of the imaging during this hospital visit personally     Scheduled Meds: . sodium chloride   Intravenous Once  . bacitracin   Topical BID  . Chlorhexidine Gluconate Cloth  6 each Topical Q0600  . [START ON 12/02/2017] darbepoetin (ARANESP) injection - DIALYSIS  60 mcg Intravenous Q Thu-HD  . feeding supplement (NEPRO CARB STEADY)  237 mL Oral BID BM  . heparin injection (subcutaneous)  5,000 Units Subcutaneous Q8H  . insulin aspart  0-9 Units Subcutaneous TID WC  . insulin glargine  10 Units Subcutaneous Daily  . levETIRAcetam  1,000 mg Oral Daily  . levETIRAcetam  500 mg Oral Q T,Th,Sat-1800  . phenytoin  125 mg Oral BID  . saccharomyces boulardii  250 mg Oral BID  . sodium chloride flush  10-40 mL Intracatheter Q12H  . topiramate  25 mg Oral BID   Continuous Infusions: . sodium chloride 500 mL (11/21/17 2323)  . ferric gluconate (FERRLECIT/NULECIT) IV 125 mg (11/27/17 1100)     LOS: 20 days  Mauricio Gerome Apley, MD [facility] Pager [phone]

## 2017-11-29 ENCOUNTER — Encounter (HOSPITAL_COMMUNITY): Payer: Self-pay | Admitting: Nurse Practitioner

## 2017-11-29 ENCOUNTER — Other Ambulatory Visit: Payer: Self-pay

## 2017-11-29 ENCOUNTER — Inpatient Hospital Stay (HOSPITAL_COMMUNITY)
Admission: RE | Admit: 2017-11-29 | Discharge: 2017-12-16 | DRG: 981 | Disposition: A | Discharge status: Skilled Nursing Facility | Payer: Medicaid Other | Source: Intra-hospital | Attending: Physical Medicine & Rehabilitation | Admitting: Physical Medicine & Rehabilitation

## 2017-11-29 DIAGNOSIS — E538 Deficiency of other specified B group vitamins: Secondary | ICD-10-CM | POA: Diagnosis present

## 2017-11-29 DIAGNOSIS — F329 Major depressive disorder, single episode, unspecified: Secondary | ICD-10-CM | POA: Diagnosis present

## 2017-11-29 DIAGNOSIS — E440 Moderate protein-calorie malnutrition: Secondary | ICD-10-CM | POA: Diagnosis present

## 2017-11-29 DIAGNOSIS — G40909 Epilepsy, unspecified, not intractable, without status epilepticus: Secondary | ICD-10-CM | POA: Diagnosis present

## 2017-11-29 DIAGNOSIS — E1122 Type 2 diabetes mellitus with diabetic chronic kidney disease: Secondary | ICD-10-CM | POA: Diagnosis present

## 2017-11-29 DIAGNOSIS — B9620 Unspecified Escherichia coli [E. coli] as the cause of diseases classified elsewhere: Secondary | ICD-10-CM | POA: Diagnosis present

## 2017-11-29 DIAGNOSIS — K3184 Gastroparesis: Secondary | ICD-10-CM | POA: Diagnosis present

## 2017-11-29 DIAGNOSIS — E872 Acidosis: Secondary | ICD-10-CM | POA: Diagnosis present

## 2017-11-29 DIAGNOSIS — Z794 Long term (current) use of insulin: Secondary | ICD-10-CM

## 2017-11-29 DIAGNOSIS — I639 Cerebral infarction, unspecified: Secondary | ICD-10-CM | POA: Diagnosis present

## 2017-11-29 DIAGNOSIS — Z833 Family history of diabetes mellitus: Secondary | ICD-10-CM | POA: Diagnosis not present

## 2017-11-29 DIAGNOSIS — E1143 Type 2 diabetes mellitus with diabetic autonomic (poly)neuropathy: Secondary | ICD-10-CM | POA: Diagnosis present

## 2017-11-29 DIAGNOSIS — I5032 Chronic diastolic (congestive) heart failure: Secondary | ICD-10-CM | POA: Diagnosis present

## 2017-11-29 DIAGNOSIS — N179 Acute kidney failure, unspecified: Secondary | ICD-10-CM

## 2017-11-29 DIAGNOSIS — I6300 Cerebral infarction due to thrombosis of unspecified precerebral artery: Secondary | ICD-10-CM | POA: Diagnosis not present

## 2017-11-29 DIAGNOSIS — I69398 Other sequelae of cerebral infarction: Secondary | ICD-10-CM | POA: Diagnosis not present

## 2017-11-29 DIAGNOSIS — K5900 Constipation, unspecified: Secondary | ICD-10-CM | POA: Diagnosis not present

## 2017-11-29 DIAGNOSIS — Z87448 Personal history of other diseases of urinary system: Secondary | ICD-10-CM

## 2017-11-29 DIAGNOSIS — Z681 Body mass index (BMI) 19 or less, adult: Secondary | ICD-10-CM

## 2017-11-29 DIAGNOSIS — R5381 Other malaise: Secondary | ICD-10-CM | POA: Diagnosis present

## 2017-11-29 DIAGNOSIS — N186 End stage renal disease: Secondary | ICD-10-CM | POA: Diagnosis present

## 2017-11-29 DIAGNOSIS — I132 Hypertensive heart and chronic kidney disease with heart failure and with stage 5 chronic kidney disease, or end stage renal disease: Secondary | ICD-10-CM | POA: Diagnosis present

## 2017-11-29 DIAGNOSIS — I69319 Unspecified symptoms and signs involving cognitive functions following cerebral infarction: Secondary | ICD-10-CM | POA: Diagnosis not present

## 2017-11-29 DIAGNOSIS — N185 Chronic kidney disease, stage 5: Secondary | ICD-10-CM | POA: Diagnosis not present

## 2017-11-29 DIAGNOSIS — N189 Chronic kidney disease, unspecified: Secondary | ICD-10-CM | POA: Diagnosis present

## 2017-11-29 DIAGNOSIS — I120 Hypertensive chronic kidney disease with stage 5 chronic kidney disease or end stage renal disease: Secondary | ICD-10-CM | POA: Diagnosis not present

## 2017-11-29 DIAGNOSIS — K219 Gastro-esophageal reflux disease without esophagitis: Secondary | ICD-10-CM | POA: Diagnosis present

## 2017-11-29 DIAGNOSIS — I6523 Occlusion and stenosis of bilateral carotid arteries: Secondary | ICD-10-CM | POA: Diagnosis not present

## 2017-11-29 DIAGNOSIS — D638 Anemia in other chronic diseases classified elsewhere: Secondary | ICD-10-CM | POA: Diagnosis present

## 2017-11-29 DIAGNOSIS — E1165 Type 2 diabetes mellitus with hyperglycemia: Secondary | ICD-10-CM | POA: Diagnosis not present

## 2017-11-29 DIAGNOSIS — E119 Type 2 diabetes mellitus without complications: Secondary | ICD-10-CM | POA: Diagnosis not present

## 2017-11-29 DIAGNOSIS — D649 Anemia, unspecified: Secondary | ICD-10-CM | POA: Diagnosis present

## 2017-11-29 DIAGNOSIS — N390 Urinary tract infection, site not specified: Secondary | ICD-10-CM | POA: Diagnosis present

## 2017-11-29 DIAGNOSIS — E785 Hyperlipidemia, unspecified: Secondary | ICD-10-CM | POA: Diagnosis present

## 2017-11-29 DIAGNOSIS — F17220 Nicotine dependence, chewing tobacco, uncomplicated: Secondary | ICD-10-CM | POA: Diagnosis present

## 2017-11-29 DIAGNOSIS — M48062 Spinal stenosis, lumbar region with neurogenic claudication: Secondary | ICD-10-CM

## 2017-11-29 DIAGNOSIS — R569 Unspecified convulsions: Secondary | ICD-10-CM

## 2017-11-29 DIAGNOSIS — Z992 Dependence on renal dialysis: Secondary | ICD-10-CM | POA: Diagnosis not present

## 2017-11-29 DIAGNOSIS — N1330 Unspecified hydronephrosis: Secondary | ICD-10-CM

## 2017-11-29 DIAGNOSIS — G9519 Other vascular myelopathies: Secondary | ICD-10-CM

## 2017-11-29 LAB — RENAL FUNCTION PANEL
Albumin: 1.8 g/dL — ABNORMAL LOW (ref 3.5–5.0)
Anion gap: 10 (ref 5–15)
BUN: 21 mg/dL (ref 8–23)
CALCIUM: 8.2 mg/dL — AB (ref 8.9–10.3)
CHLORIDE: 97 mmol/L — AB (ref 98–111)
CO2: 26 mmol/L (ref 22–32)
CREATININE: 3.57 mg/dL — AB (ref 0.44–1.00)
GFR, EST AFRICAN AMERICAN: 15 mL/min — AB (ref >60)
GFR, EST NON AFRICAN AMERICAN: 13 mL/min — AB (ref >60)
Glucose, Bld: 179 mg/dL — ABNORMAL HIGH (ref 70–99)
PHOSPHORUS: 2.5 mg/dL (ref 2.5–4.6)
Potassium: 4.9 mmol/L (ref 3.5–5.1)
Sodium: 133 mmol/L — ABNORMAL LOW (ref 135–145)

## 2017-11-29 LAB — GLUCOSE, CAPILLARY
GLUCOSE-CAPILLARY: 248 mg/dL — AB (ref 70–99)
GLUCOSE-CAPILLARY: 123 mg/dL — AB (ref 70–99)
GLUCOSE-CAPILLARY: 173 mg/dL — AB (ref 70–99)
GLUCOSE-CAPILLARY: 282 mg/dL — AB (ref 70–99)
Glucose-Capillary: 177 mg/dL — ABNORMAL HIGH (ref 70–99)
Glucose-Capillary: 49 mg/dL — ABNORMAL LOW (ref 70–99)
Glucose-Capillary: 54 mg/dL — ABNORMAL LOW (ref 70–99)
Glucose-Capillary: 242 mg/dL — ABNORMAL HIGH (ref 70–99)
Glucose-Capillary: 218 mg/dL — ABNORMAL HIGH (ref 70–99)

## 2017-11-29 MED ORDER — BACITRACIN ZINC 500 UNIT/GM EX OINT
TOPICAL_OINTMENT | Freq: Two times a day (BID) | CUTANEOUS | Status: DC
  Administered 2017-11-29 – 2017-12-01 (×5): via TOPICAL
  Administered 2017-12-02: 1 via TOPICAL
  Administered 2017-12-02 – 2017-12-03 (×2): via TOPICAL
  Administered 2017-12-03: 1 via TOPICAL
  Administered 2017-12-04 – 2017-12-09 (×8): via TOPICAL
  Filled 2017-11-29 (×6): qty 28.35

## 2017-11-29 MED ORDER — ONDANSETRON HCL 4 MG/2ML IJ SOLN: 4.0000 mg | Freq: Four times a day (QID) | INTRAMUSCULAR | Status: DC | PRN

## 2017-11-29 MED ORDER — RENA-VITE PO TABS: 1.0000 | ORAL_TABLET | Freq: Every day | ORAL | Status: DC

## 2017-11-29 MED ORDER — CHLORHEXIDINE GLUCONATE CLOTH 2 % EX PADS
6.0000 | MEDICATED_PAD | Freq: Every day | CUTANEOUS | Status: DC
  Administered 2017-12-01 – 2017-12-10 (×9): 6 via TOPICAL

## 2017-11-29 MED ORDER — TOPIRAMATE 25 MG PO TABS: 25.0000 mg | ORAL_TABLET | Freq: Two times a day (BID) | ORAL | 0 refills | Status: DC

## 2017-11-29 MED ORDER — LEVETIRACETAM 500 MG PO TABS
500.0000 mg | ORAL_TABLET | ORAL | Status: DC
  Administered 2017-12-02: 500 mg via ORAL
  Filled 2017-11-29 (×2): qty 1

## 2017-11-29 MED ORDER — ACETAMINOPHEN 325 MG PO TABS
325.0000 mg | ORAL_TABLET | ORAL | Status: DC | PRN
  Administered 2017-12-06 – 2017-12-16 (×4): 650 mg via ORAL
  Filled 2017-11-29 (×4): qty 2

## 2017-11-29 MED ORDER — GUAIFENESIN-DM 100-10 MG/5ML PO SYRP: 5.0000 mL | ORAL_SOLUTION | Freq: Four times a day (QID) | ORAL | Status: DC | PRN

## 2017-11-29 MED ORDER — ALUMINUM HYDROXIDE GEL 320 MG/5ML PO SUSP: 5.0000 mL | ORAL | Status: DC | PRN

## 2017-11-29 MED ORDER — LEVETIRACETAM 500 MG PO TABS
1000.0000 mg | ORAL_TABLET | Freq: Every day | ORAL | Status: DC
  Administered 2017-11-30 – 2017-12-16 (×16): 1000 mg via ORAL
  Filled 2017-11-29 (×16): qty 2

## 2017-11-29 MED ORDER — LEVETIRACETAM 500 MG PO TABS: 500.0000 mg | ORAL_TABLET | ORAL | 0 refills | Status: DC

## 2017-11-29 MED ORDER — INSULIN ASPART 100 UNIT/ML ~~LOC~~ SOLN: 0.0000 [IU] | Freq: Three times a day (TID) | SUBCUTANEOUS | Status: DC

## 2017-11-29 MED ORDER — INSULIN GLARGINE 100 UNIT/ML ~~LOC~~ SOLN
5.0000 [IU] | Freq: Every day | SUBCUTANEOUS | Status: DC
  Administered 2017-11-30 – 2017-12-01 (×2): 5 [IU] via SUBCUTANEOUS
  Filled 2017-11-29 (×2): qty 0.05

## 2017-11-29 MED ORDER — TOPIRAMATE 25 MG PO TABS
25.0000 mg | ORAL_TABLET | Freq: Two times a day (BID) | ORAL | Status: DC
  Administered 2017-11-29 – 2017-12-16 (×33): 25 mg via ORAL
  Filled 2017-11-29 (×34): qty 1

## 2017-11-29 MED ORDER — HEPARIN SODIUM (PORCINE) 5000 UNIT/ML IJ SOLN
5000.0000 [IU] | Freq: Three times a day (TID) | INTRAMUSCULAR | Status: DC
  Administered 2017-11-29 – 2017-12-16 (×45): 5000 [IU] via SUBCUTANEOUS
  Filled 2017-11-29 (×45): qty 1

## 2017-11-29 MED ORDER — POLYETHYLENE GLYCOL 3350 17 G PO PACK: 17.0000 g | PACK | Freq: Every day | ORAL | Status: DC | PRN

## 2017-11-29 MED ORDER — SENNOSIDES-DOCUSATE SODIUM 8.6-50 MG PO TABS: 1.0000 | ORAL_TABLET | Freq: Every evening | ORAL | Status: DC | PRN

## 2017-11-29 MED ORDER — SODIUM CHLORIDE 0.9 % IV SOLN
125.0000 mg | INTRAVENOUS | Status: DC
  Filled 2017-11-29 (×2): qty 10

## 2017-11-29 MED ORDER — PHENYTOIN 125 MG/5ML PO SUSP: 125.0000 mg | Freq: Two times a day (BID) | ORAL | 12 refills | Status: DC

## 2017-11-29 MED ORDER — INSULIN ASPART 100 UNIT/ML ~~LOC~~ SOLN: 0.0000 [IU] | Freq: Three times a day (TID) | SUBCUTANEOUS | 0 refills | Status: DC

## 2017-11-29 MED ORDER — NEPRO/CARBSTEADY PO LIQD
237.0000 mL | Freq: Two times a day (BID) | ORAL | Status: DC
  Administered 2017-11-30 – 2017-12-16 (×29): 237 mL via ORAL

## 2017-11-29 MED ORDER — INSULIN GLARGINE 100 UNIT/ML ~~LOC~~ SOLN: 5.0000 [IU] | Freq: Every day | SUBCUTANEOUS | 0 refills | Status: DC

## 2017-11-29 MED ORDER — DIPHENOXYLATE-ATROPINE 2.5-0.025 MG/5ML PO LIQD: 5.0000 mL | Freq: Two times a day (BID) | ORAL | Status: DC | PRN

## 2017-11-29 MED ORDER — INSULIN ASPART 100 UNIT/ML ~~LOC~~ SOLN
0.0000 [IU] | Freq: Three times a day (TID) | SUBCUTANEOUS | Status: DC
  Administered 2017-11-29: 3 [IU] via SUBCUTANEOUS
  Administered 2017-11-30: 2 [IU] via SUBCUTANEOUS
  Administered 2017-11-30 (×2): 5 [IU] via SUBCUTANEOUS
  Administered 2017-12-01: 2 [IU] via SUBCUTANEOUS
  Administered 2017-12-01 – 2017-12-02 (×2): 1 [IU] via SUBCUTANEOUS
  Administered 2017-12-02 – 2017-12-03 (×2): 3 [IU] via SUBCUTANEOUS
  Administered 2017-12-03: 2 [IU] via SUBCUTANEOUS
  Administered 2017-12-03 – 2017-12-04 (×3): 5 [IU] via SUBCUTANEOUS

## 2017-11-29 MED ORDER — NEPRO/CARBSTEADY PO LIQD: 237.0000 mL | Freq: Two times a day (BID) | ORAL | 0 refills | Status: AC

## 2017-11-29 MED ORDER — PHENYTOIN 125 MG/5ML PO SUSP
125.0000 mg | Freq: Two times a day (BID) | ORAL | Status: DC
  Administered 2017-11-30: 125 mg via ORAL
  Filled 2017-11-29 (×5): qty 5

## 2017-11-29 MED ORDER — DIPHENOXYLATE-ATROPINE 2.5-0.025 MG PO TABS
1.0000 | ORAL_TABLET | Freq: Two times a day (BID) | ORAL | Status: DC | PRN
  Administered 2017-12-01 – 2017-12-09 (×2): 1 via ORAL
  Filled 2017-11-29 (×2): qty 1

## 2017-11-29 MED ORDER — BISACODYL 10 MG RE SUPP: 10.0000 mg | Freq: Every day | RECTAL | Status: DC | PRN

## 2017-11-29 MED ORDER — DARBEPOETIN ALFA 150 MCG/0.3ML IJ SOSY: 150.0000 ug | PREFILLED_SYRINGE | INTRAMUSCULAR | Status: DC

## 2017-11-29 MED ORDER — INSULIN GLARGINE 100 UNIT/ML ~~LOC~~ SOLN
5.0000 [IU] | Freq: Every day | SUBCUTANEOUS | Status: DC
  Administered 2017-11-29: 5 [IU] via SUBCUTANEOUS
  Filled 2017-11-29: qty 0.05

## 2017-11-29 MED ORDER — DARBEPOETIN ALFA 150 MCG/0.3ML IJ SOSY
150.0000 ug | PREFILLED_SYRINGE | INTRAMUSCULAR | Status: DC
  Filled 2017-11-29: qty 0.3

## 2017-11-29 MED ORDER — RENA-VITE PO TABS
1.0000 | ORAL_TABLET | Freq: Every day | ORAL | Status: DC
  Administered 2017-11-29 – 2017-12-15 (×17): 1 via ORAL
  Filled 2017-11-29 (×17): qty 1

## 2017-11-29 MED ORDER — LEVETIRACETAM 1000 MG PO TABS: 1000.0000 mg | ORAL_TABLET | Freq: Every day | ORAL | 0 refills | Status: DC

## 2017-11-29 MED ORDER — SACCHAROMYCES BOULARDII 250 MG PO CAPS
250.0000 mg | ORAL_CAPSULE | Freq: Two times a day (BID) | ORAL | Status: DC
  Administered 2017-11-29 – 2017-12-16 (×33): 250 mg via ORAL
  Filled 2017-11-29 (×33): qty 1

## 2017-11-29 MED ORDER — DIPHENHYDRAMINE HCL 12.5 MG/5ML PO ELIX
12.5000 mg | ORAL_SOLUTION | Freq: Four times a day (QID) | ORAL | Status: DC | PRN
  Administered 2017-12-04 – 2017-12-16 (×17): 25 mg via ORAL
  Filled 2017-11-29 (×17): qty 10

## 2017-11-29 MED ORDER — RENA-VITE PO TABS: 1.0000 | ORAL_TABLET | Freq: Every day | ORAL | 0 refills | Status: AC

## 2017-11-29 MED ORDER — TRAZODONE HCL 50 MG PO TABS
25.0000 mg | ORAL_TABLET | Freq: Every evening | ORAL | Status: DC | PRN
  Administered 2017-11-30: 25 mg via ORAL
  Administered 2017-12-03 – 2017-12-07 (×3): 50 mg via ORAL
  Administered 2017-12-14: 25 mg via ORAL
  Filled 2017-11-29 (×5): qty 1

## 2017-11-29 MED ORDER — ONDANSETRON HCL 4 MG PO TABS: 4.0000 mg | ORAL_TABLET | Freq: Four times a day (QID) | ORAL | Status: DC | PRN

## 2017-11-29 NOTE — Care Management Note (Signed)
Patient is discharging to CIR today. No further needs per CM.

## 2017-11-29 NOTE — H&P (Signed)
Physical Medicine and Rehabilitation Admission H&P    Chief Complaint  Patient presents with  . Functional deficits due to stroke and uremia.     HPI:  Erica [name] is a 62 year old female with history of T2DM with gastropathy and neuropathy, hypertension, seizure disorder, bilateral hydronephrosis status post stent who was admitted to [facility] on 11/08/2017 for weakness acute on chronic renal failure with uremia, encephalopathy, poor p.o. intake with weakness, leukocytosis and question of UTI.  History taken from chart review. She was transferred to [facility] for treatment and started on hemodialysis.   Dr. 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  As patient with recurrent E. Coli, he recommended keeping patient on antibiotic coverage until stents removed.  She was treated with 2-week course of IV ceftriaxone.  While in dialysis on   09/29, she developed sided weakness left facial twitching and increase in lethargy.  She was loaded with Keppra and Topamax added due to reports of left eye twitching for 2 days and concern for partial status epilepticus.  MRI of brain reviewed, showing multifocal infarct. Per report, scattered punctate foci of acute infarct in the deep white matter of both frontal and parietal vertices consistent with watershed infarct.  MRA brain limited by motion but no emergent large vessel occlusion or stenosis noted.  2D echo showed normal LV size with moderate asymmetric basal septal hypertrophy --due to HTN versus hypertrophic cardiomyopathy and EF 60 to 65%.  Dr. Leonel Ramsay felt that  multifocal infarct due to hypo-perfusion and no further modification of medical regiment recommended.  She developed fevers on 10/1 and hemodialysis catheter removed. Blood culture negative defervesced.  Right hemodialysis catheter placed on 10/07 by Dr. Barbie Banner  and hemodialysis ongoing on Tuesday Thursday Saturday (accepted at [facility] TTS-12:20 PM slot).  Treated with 1 unit PRBC.  Therapy ongoing and patient limited by decreased processing, cognitive deficits with delayed processing, ataxia and LLE weakness.   CIR recommended for follow-up therapy   Review of Systems  Constitutional: Negative for chills and fever.  HENT: Negative for hearing loss and tinnitus.   Eyes: Negative for double vision and photophobia.  Respiratory: Negative for cough and shortness of breath.   Cardiovascular: Negative for chest pain and palpitations.  Gastrointestinal: Positive for diarrhea. Negative for heartburn and nausea.       Incontinent due to diarrhea.   Genitourinary: Negative for dysuria.  Musculoskeletal: Positive for myalgias.  Skin: Negative for itching and rash.  Neurological: Positive for dizziness and focal weakness.  Psychiatric/Behavioral: The patient does not have insomnia.   All other systems reviewed and are negative.     Past Medical History:  Diagnosis Date  . Alcohol-induced pancreatitis   . Chronic diarrhea   . Depression   . Diabetes mellitus    fasting blood sugar 110-120s  . Diastolic CHF ([place])   . DKA (diabetic ketoacidoses) ([place])   . Gastroparesis   . GERD (gastroesophageal reflux disease)   . Heart murmur   . History of kidney stones   . Hyperlipidemia   . Hypertension   . Hypokalemia   . Muscle spasm   . Neuropathic pain   . Neuropathy    Hx: of  . Pyelonephritis   . Recurrent pancreatitis   . Seizures ([place])   . Vitamin B12 deficiency   . Vitamin D deficiency     Past Surgical  History:  Procedure Laterality Date  . CATARACT EXTRACTION W/PHACO Right 03/14/2015   Procedure: CATARACT EXTRACTION PHACO AND INTRAOCULAR LENS PLACEMENT (IOC);  Surgeon: Baruch Goldmann, MD;  Location: [facility];  Service: Ophthalmology;  Laterality: Right;  CDE:11.13  . CATARACT EXTRACTION W/PHACO Left 04/11/2015   Procedure:  CATARACT EXTRACTION PHACO AND INTRAOCULAR LENS PLACEMENT LEFT EYE CDE=9.68;  Surgeon: Baruch Goldmann, MD;  Location: [facility];  Service: Ophthalmology;  Laterality: Left;  . COLONOSCOPY  02/24/2010  . cystoscopy with ureteral stent  Bilateral 10/07/2017   At [facility] CV LINE RIGHT  11/17/2017  . IR FLUORO GUIDE CV LINE RIGHT  11/22/2017  . IR US GUIDE VASC ACCESS RIGHT  11/17/2017  . MULTIPLE EXTRACTIONS WITH ALVEOLOPLASTY N/A 06/13/2012   Procedure: MULTIPLE EXTRACION WITH ALVEOLOPLASTY EXTRACT: 18, 19, 20, 21, 22, 24, 25, 27, 28, 29, 30, 31;  Surgeon: Gae Bon, DDS;  Location: [facility];  Service: Oral Surgery;  Laterality: N/A;  . TUBAL LIGATION    . URETERAL STENT PLACEMENT  09/2017    Family History  Problem Relation Age of Onset  . Diabetes Sister   . Chronic Renal Failure Neg Hx     Social History:  reports that she has never smoked. Her smokeless tobacco use includes snuff. She reports that she drinks about 1.0 standard drinks of alcohol per week. She reports that she does not use drugs.    Allergies  Allergen Reactions  . Aspirin Palpitations    Medications Prior to Admission  Medication Sig Dispense Refill  . albuterol (PROAIR HFA) 108 (90 BASE) MCG/ACT inhaler Inhale 2 puffs into the lungs every 6 (six) hours as needed for wheezing or shortness of breath.    Marland Kitchen amLODipine (NORVASC) 5 MG tablet Take 5 mg by mouth daily.    . cholecalciferol (VITAMIN D) 1000 units tablet Take 1,000 Units by mouth daily.    Marland Kitchen esomeprazole (NEXIUM) 40 MG capsule Take 40 mg by mouth daily.     . hyoscyamine (ANASPAZ) 0.125 MG TBDP disintergrating tablet Take 0.125 mg by mouth 4 (four) times daily -  with meals and at bedtime.     . insulin aspart (NOVOLOG) 100 UNIT/ML FlexPen Inject 3 Units into the skin 3 (three) times daily with meals. (Patient taking differently: Inject 4-10 Units into the skin 3 (three) times daily with meals. Take 15 minutes before meals) 3 mL 0  .  Insulin Degludec (TRESIBA FLEXTOUCH) 200 UNIT/ML SOPN Inject 10 Units into the skin daily with breakfast.     . levETIRAcetam (KEPPRA) 1000 MG tablet Take 1 tablet (1,000 mg total) by mouth 2 (two) times daily. 60 tablet 0  . linaclotide (LINZESS) 145 MCG CAPS capsule Take 145 mcg by mouth daily before breakfast.    . magnesium oxide (MAG-OX) 400 MG tablet Take 400 mg by mouth daily.    . metoCLOPramide (REGLAN) 5 MG tablet Take 5 mg by mouth 4 (four) times daily.    . metoprolol succinate (TOPROL-XL) 100 MG 24 hr tablet Take 100 mg by mouth daily. Take with or immediately following a meal.    . mirtazapine (REMERON) 15 MG tablet Take 15 mg by mouth at bedtime.    . OxyCODONE HCl, Abuse Deter, (OXAYDO) 5 MG TABA Take 5 mg by mouth every 12 (twelve) hours as needed for pain.     Marland Kitchen PARoxetine (PAXIL) 20 MG tablet Take 20 mg by mouth daily.    Marland Kitchen rOPINIRole (REQUIP)  1 MG tablet Take 2 mg by mouth daily. BETWEEN 7 PM AND 8 PM.     . sodium bicarbonate 650 MG tablet Take 650 mg by mouth 4 (four) times daily.      Drug Regimen Review  Drug regimen was reviewed and remains appropriate with no significant issues identified  Home: Home Living Family/patient expects to be discharged to:: Private residence Living Arrangements: Children Available Help at Discharge: Family Home Access: Level entry Home Layout: One level Bathroom Shower/Tub: Walk-in shower Home Equipment: Environmental consultant - 2 wheels, Radio producer - single point, Civil engineer, contracting  Lives With: Son   Functional History: Prior Function Level of Independence: Independent Comments: per pt independent with ADls and mobility  Functional Status:  Mobility: Bed Mobility Overal bed mobility: Needs Assistance Bed Mobility: Supine to Sit Rolling: Min guard Sidelying to sit: Min assist, HOB elevated Supine to sit: Min assist, HOB elevated General bed mobility comments: Up in chair upon PT arrival.  Transfers Overall transfer level: Needs assistance Equipment  used: Rolling walker (2 wheeled) Transfers: Sit to/from Stand Sit to Stand: Mod assist Stand pivot transfers: Mod assist General transfer comment: Assist to power to standing with cues for hand placement/technique. Stood from Automotive engineer.  Ambulation/Gait Ambulation/Gait assistance: Min assist Gait Distance (Feet): 50 Feet Assistive device: Rolling walker (2 wheeled) Gait Pattern/deviations: Step-to pattern, Decreased stride length, Ataxic, Steppage, Decreased step length - right General Gait Details: Slow, unsteady and ataxic like gait pattern through LUE/LE; Stays within left side of RW, steppage gait noted on LLE to advance. Attempted gait training with HHA and pt required Mod A for balance.  Gait velocity: decreased    ADL: ADL Overall ADL's : Needs assistance/impaired Eating/Feeding Details (indicate cue type and reason): was able to independently open twist top container lid with extra time and effort and drink with supervision Grooming: Oral care, Wash/dry hands, Wash/dry face, Sitting, Supervision/safety Grooming Details (indicate cue type and reason): Pt using both hands for both tasks with LUE moving more slowly then RUE. Seated in recliner (supported seating) Upper Body Bathing: Moderate assistance, Bed level Lower Body Bathing: Total assistance, +2 for physical assistance, Bed level Upper Body Dressing : Maximal assistance, Bed level Lower Body Dressing: Total assistance, +2 for physical assistance, Bed level Lower Body Dressing Details (indicate cue type and reason): Pt able to cross her legs to get to her socks to doff and donn her socks Toilet Transfer: Moderate assistance, Stand-pivot, BSC Toilet Transfer Details (indicate cue type and reason): simulated with EOB>recliner Toileting- Clothing Manipulation and Hygiene: Total assistance Toileting - Clothing Manipulation Details (indicate cue type and reason): with Mod A sit<>stand Functional mobility during ADLs: Maximal  assistance, +2 for physical assistance General ADL Comments: bed mobility, sat EOB about 2-3 minutes then SPT to recliner. Pt then completed grooming tasks.   Cognition: Cognition Overall Cognitive Status: No family/caregiver present to determine baseline cognitive functioning Arousal/Alertness: Awake/alert Orientation Level: Oriented X4 Attention: Sustained Sustained Attention: Appears intact Memory: (TBA) Awareness: Impaired Awareness Impairment: Intellectual impairment Problem Solving: (TBA) Safety/Judgment: Impaired Cognition Arousal/Alertness: Awake/alert Behavior During Therapy: WFL for tasks assessed/performed Overall Cognitive Status: No family/caregiver present to determine baseline cognitive functioning Area of Impairment: Problem solving, Following commands Orientation Level: Disoriented to, Time Memory: Decreased short-term memory Following Commands: Follows one step commands with increased time, Follows multi-step commands inconsistently Safety/Judgement: Decreased awareness of deficits, Decreased awareness of safety Awareness: Intellectual Problem Solving: Slow processing General Comments: Difficulty with motor planning, sequencing.   Blood pressure 131/67,  pulse 87, temperature 98.1 F (36.7 C), temperature source Oral, resp. rate 18, height 5' (1.524 m), weight 47.8 kg, SpO2 100 %. Physical Exam  Nursing note and vitals reviewed. Constitutional: She appears well-developed. She appears cachectic. She is cooperative.  HENT:  Head: Normocephalic and atraumatic.  Edentulous  Eyes: EOM are normal. Right eye exhibits no discharge. Left eye exhibits no discharge.  Neck: Normal range of motion. Neck supple.  Cardiovascular: Normal rate and regular rhythm.  Murmur heard. Respiratory: Effort normal and breath sounds normal.  GI: Soft. Bowel sounds are normal.  Musculoskeletal:  No edema or tenderness in extremities  Neurological: She is alert.  Speech clear.  Able  to follow simple one and two step commands.  Able to recall day, year as well as the fact that she has had a birthday since being in the hospital.  Motor: Grossly 4/5 throughout (?right stronger than left) LUE with ataxia with finger to nose and decrease in fine motor movements b/l.   Skin: Skin is warm and dry.  Psychiatric: She has a normal mood and affect. Her behavior is normal.   Results for orders placed or performed during the hospital encounter of 11/08/17 (from the past 48 hour(s))  Glucose, capillary     Status: None   Collection Time: 11/27/17 11:47 AM  Result Value Ref Range   Glucose-Capillary 78 70 - 99 mg/dL  Glucose, capillary     Status: Abnormal   Collection Time: 11/27/17  4:39 PM  Result Value Ref Range   Glucose-Capillary 159 (H) 70 - 99 mg/dL   Comment 1 Notify RN    Comment 2 Document in Chart   Glucose, capillary     Status: Abnormal   Collection Time: 11/27/17  7:57 PM  Result Value Ref Range   Glucose-Capillary 248 (H) 70 - 99 mg/dL   Comment 1 Notify RN   Glucose, capillary     Status: Abnormal   Collection Time: 11/28/17  6:22 AM  Result Value Ref Range   Glucose-Capillary 166 (H) 70 - 99 mg/dL   Comment 1 Notify RN   Glucose, capillary     Status: Abnormal   Collection Time: 11/28/17 11:53 AM  Result Value Ref Range   Glucose-Capillary 151 (H) 70 - 99 mg/dL   Comment 1 Notify RN    Comment 2 Document in Chart   Glucose, capillary     Status: Abnormal   Collection Time: 11/28/17  4:33 PM  Result Value Ref Range   Glucose-Capillary 172 (H) 70 - 99 mg/dL   Comment 1 Notify RN    Comment 2 Document in Chart   Glucose, capillary     Status: Abnormal   Collection Time: 11/28/17  9:16 PM  Result Value Ref Range   Glucose-Capillary 177 (H) 70 - 99 mg/dL  Glucose, capillary     Status: Abnormal   Collection Time: 11/29/17  6:47 AM  Result Value Ref Range   Glucose-Capillary 49 (L) 70 - 99 mg/dL  Glucose, capillary     Status: Abnormal   Collection  Time: 11/29/17  7:36 AM  Result Value Ref Range   Glucose-Capillary 54 (L) 70 - 99 mg/dL   Comment 1 Notify RN    Comment 2 Document in Chart   Glucose, capillary     Status: Abnormal   Collection Time: 11/29/17  8:51 AM  Result Value Ref Range   Glucose-Capillary 123 (H) 70 - 99 mg/dL  Renal function panel  Status: Abnormal   Collection Time: 11/29/17  9:31 AM  Result Value Ref Range   Sodium 133 (L) 135 - 145 mmol/L   Potassium 4.9 3.5 - 5.1 mmol/L   Chloride 97 (L) 98 - 111 mmol/L   CO2 26 22 - 32 mmol/L   Glucose, Bld 179 (H) 70 - 99 mg/dL   BUN 21 8 - 23 mg/dL   Creatinine, Ser 3.57 (H) 0.44 - 1.00 mg/dL   Calcium 8.2 (L) 8.9 - 10.3 mg/dL   Phosphorus 2.5 2.5 - 4.6 mg/dL   Albumin 1.8 (L) 3.5 - 5.0 g/dL   GFR calc non Af Amer 13 (L) >60 mL/min   GFR calc Af Amer 15 (L) >60 mL/min    Comment: (NOTE) The eGFR has been calculated using the CKD EPI equation. This calculation has not been validated in all clinical situations. eGFR's persistently <60 mL/min signify possible Chronic Kidney Disease.    Anion gap 10 5 - 15    Comment: Performed at [facility] [address] [place], [place] [address]  Glucose, capillary     Status: Abnormal   Collection Time: 11/29/17 11:24 AM  Result Value Ref Range   Glucose-Capillary 173 (H) 70 - 99 mg/dL   No results found.     Medical Problem List and Plan: 1.  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   2.  DVT Prophylaxis/Anticoagulation: Pharmaceutical: Lovenox 3. Pain Management: tylenol prn.  4. Mood: LCSW to follow for evaluation and support.  5. Neuropsych: This patient is not fully capable of making decisions on her own behalf. 6. Skin/Wound Care: Routine pressure relief measures measures. 7.  Fluids/Electrolytes/Nutrition: Strict I's and O's.  Labs with dialysis. 8.  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  Neurology with recommendations to increase Topamax to 50 mg twice daily 9.  History of hydronephrosis s/p stents and recurrent E. coli UTI: Urology with recommendations to continue antibiotics. Recheck UA/UCS as with recurrent fevers in the past 24 hours--will resume Keflex pre recommendations.  10.  T2DM: Intake improving with improvement in mentation.  Low-dose Lantus resumed today.  Will change timing of nephro to with meals 11.  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  Anemia of chronic disease: Started on 12 load Fe.  Continue Aranesp.    Post Admission Physician Evaluation: 1. Preadmission assessment reviewed and changes made below. 2. Functional deficits secondary  to scattered punctate foci of acute infarct in the deep white matter of both frontal and parietal vertices consistent with watershed infarct. 3. Patient is admitted to receive collaborative, interdisciplinary care between the physiatrist, rehab nursing staff, and therapy team. 4. Patient's level of medical complexity and substantial therapy needs in context of that medical necessity cannot be provided at a lesser intensity of care such as a SNF. 5. Patient has experienced substantial functional loss from his/her baseline which was documented above under the "Functional History" and "Functional Status" headings.  Judging by the patient's diagnosis, physical exam, and functional history, the patient has potential for functional progress which will result in measurable gains while on inpatient rehab.  These gains will be of substantial and practical use upon discharge  in facilitating mobility and self-care  at the household level. 6. Physiatrist will provide 24 hour  management of medical needs as well as oversight of the therapy plan/treatment and provide guidance as appropriate regarding the interaction of the two. 7. 24 hour rehab nursing will assist with safety, disease management, medication administration and patient education  and help integrate therapy concepts, techniques,education, etc. 8. PT will assess and treat for/with: Lower extremity strength, range of motion, stamina, balance, functional mobility, safety, adaptive techniques and equipment, coping skills, pain control, education. Goals are: Supervision/Min A. 9. OT will assess and treat for/with: ADL's, functional mobility, safety, upper extremity strength, adaptive techniques and equipment, ego support, and community reintegration.   Goals are: Supervision/Min A. Therapy may proceed with showering this patient. 10. SLP will assess and treat for/with: cognition, swallowing.  Goals are: Supervision. 11. Case Management and Social Worker will assess and treat for psychological issues and discharge planning. 12. Team conference will be held weekly to assess progress toward goals and to determine barriers to discharge. 13. Patient will receive at least 3 hours of therapy per day at least 5 days per week. 14. ELOS: 14-18 days.       15. Prognosis:  good  I have personally performed a face to face diagnostic evaluation, including, but not limited to relevant history and physical exam findings, of this patient and developed relevant assessment and plan.  Additionally, I have reviewed and concur with the physician assistant's documentation above.  The patient's status has not changed. The original post admission physician evaluation remains appropriate, and any changes from the pre-admission screening or documentation from the acute chart are noted above.    Delice Lesch, MD, ABPMR Bary Leriche, PA-C 11/29/2017

## 2017-11-29 NOTE — PMR Pre-admission (Signed)
PMR Admission Coordinator Pre-Admission Assessment  Patient: Erica [name] is an 62 y.o., female MRN: [id number] DOB: [birth year] Height: 5' (152.4 cm) Weight: 47.8 kg              Insurance Information  PRIMARY: Medicaid [place] [employer]      Policy#: [id number]      Subscriber: pt Benefits:  Phone #: [phone]     Name: 11/26/2017 Eff. Date: active     MADCY  Medicaid Application Date:       Case Manager:  Disability Application Date:       Case Worker:   Emergency Contact Information Contact Information    Name Relation Home Work Mobile   [place] Son [phone]  [phone]   [facility] Daughter [phone]       Current Medical History  Patient Admitting Diagnosis: bilateral infarcts  History of Present Illness: Erica [name] is a 62 year old female with history of T2DM with gastropathy and neuropathy, hypertension, seizure disorder, bilateral hydronephrosis status post stent who was admitted to an outside  hospital on 11/08/2017 for weakness acute on chronic renal failure with uremia, encephalopathy, poor p.o. intake with weakness, leukocytosis and question of UTI.  She was transferred to [facility] for treatment and started on hemodialysis.   Dr. 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  As patient with recurrent E. Coli, he recommended keeping patient on antibiotic coverage until stents removed.  She was treated with 2-week course of IV ceftriaxone.  While in dialysis on   09/29, she developed sided weakness left facial twitching and increase in lethargy.  She was loaded with Keppra and Topamax added due to reports of left eye twitching for 2 days and concern for partial status epilepticus.  MRI of brain done revealing scattered punctate foci of acute infarct in the deep white matter of both frontal and parietal vertices consistent with  watershed infarct.  MRA brain limited by motion but no emergent large vessel occlusion or stenosis noted.  2D echo showed normal LV size with moderate asymmetric basal septal hypertrophy --due to HTN versus hypertrophic cardiomyopathy and EF 60 to 65%.  Dr. Leonel Ramsay felt that  multifocal infarct due to hypo-perfusion and no further modification of medical regiment recommended.  She developed fevers on 10/1 and hemodialysis catheter removed. Blood culture negative defervesced..  Right hemodialysis catheter placed on 10/07 by Dr. Barbie Banner and hemodialysis ongoing on Tuesday Thursday Saturday (accepted at [facility]-12:20 PM slot).  Treated with 1 unit PRBC.  Therapy ongoing and patient limited by decreased processing, cognitive deficits with delayed processing, ataxia and LLE weakness.      Complete NIHSS TOTAL: 0    Past Medical History  Past Medical History:  Diagnosis Date  . Alcohol-induced pancreatitis   . Chronic diarrhea   . Depression   . Diabetes mellitus    fasting blood sugar 110-120s  . Diastolic CHF ([place])   . DKA (diabetic ketoacidoses) ([place])   . Gastroparesis   . GERD (gastroesophageal reflux disease)   . Heart murmur   . History of kidney stones   . Hyperlipidemia   . Hypertension   . Hypokalemia   . Muscle spasm   . Neuropathic pain   . Neuropathy    Hx: of  . Pyelonephritis   . Recurrent pancreatitis   . Seizures ([place])   . Vitamin B12 deficiency   . Vitamin D deficiency  Family History  family history includes Diabetes in her sister.  Prior Rehab/Hospitalizations:  Has the patient had major surgery during 100 days prior to admission? Yes  Current Medications   Current Facility-Administered Medications:  .  0.9 %  sodium chloride infusion (Manually program via Guardrails IV Fluids), , Intravenous, Once, Elgergawy, Silver Huguenin, MD .  0.9 %  sodium chloride infusion, , Intravenous, PRN, Tat, David, MD, Last Rate: 10 mL/hr at 11/21/17 2323,  500 mL at 11/21/17 2323 .  acetaminophen (TYLENOL) tablet 650 mg, 650 mg, Oral, Q6H PRN, 650 mg at 11/27/17 2021 **OR** acetaminophen (TYLENOL) suppository 650 mg, 650 mg, Rectal, Q6H PRN, Johnson-Pitts, Endia, MD, 650 mg at 11/16/17 1339 .  bacitracin ointment, , Topical, BID, Bonnielee Haff, MD .  Chlorhexidine Gluconate Cloth 2 % PADS 6 each, 6 each, Topical, Q0600, Justin Mend, MD, 6 each at 11/29/17 0612 .  [START ON 12/02/2017] Darbepoetin Alfa (ARANESP) injection 150 mcg, 150 mcg, Intravenous, Q Thu-HD, Deterding, James, MD .  feeding supplement (NEPRO CARB STEADY) liquid 237 mL, 237 mL, Oral, BID BM, Elgergawy, Silver Huguenin, MD, 237 mL at 11/29/17 1414 .  ferric gluconate (NULECIT) 125 mg in sodium chloride 0.9 % 100 mL IVPB, 125 mg, Intravenous, Q T,Th,Sa-HD, Sanford, Ryan B, MD, Last Rate: 110 mL/hr at 11/27/17 1100, 125 mg at 11/27/17 1100 .  heparin injection 5,000 Units, 5,000 Units, Subcutaneous, Q8H, Bonnielee Haff, MD, 5,000 Units at 11/29/17 1414 .  insulin aspart (novoLOG) injection 0-9 Units, 0-9 Units, Subcutaneous, TID WC, Johnson-Pitts, Endia, MD, 2 Units at 11/29/17 1250 .  insulin glargine (LANTUS) injection 5 Units, 5 Units, Subcutaneous, Daily, Arrien, Jimmy Picket, MD, 5 Units at 11/29/17 1017 .  levETIRAcetam (KEPPRA) tablet 1,000 mg, 1,000 mg, Oral, Daily, Bonnielee Haff, MD, 1,000 mg at 11/29/17 1010 .  levETIRAcetam (KEPPRA) tablet 500 mg, 500 mg, Oral, Q T,Th,Sat-1800, Arrien, Jimmy Picket, MD, 500 mg at 11/27/17 1720 .  loperamide (IMODIUM) capsule 2 mg, 2 mg, Oral, TID PRN, Bonnielee Haff, MD, 2 mg at 11/22/17 1325 .  multivitamin (RENA-VIT) tablet 1 tablet, 1 tablet, Oral, QHS, Deterding, James, MD .  ondansetron (ZOFRAN) tablet 4 mg, 4 mg, Oral, Q6H PRN **OR** ondansetron (ZOFRAN) injection 4 mg, 4 mg, Intravenous, Q6H PRN, Johnson-Pitts, Endia, MD, 4 mg at 11/11/17 1910 .  phenytoin (DILANTIN) 125 MG/5ML suspension 125 mg, 125 mg, Oral, BID, Bonnielee Haff, MD, 125 mg at 11/29/17 1010 .  RESOURCE THICKENUP CLEAR, , Oral, PRN, Elgergawy, Silver Huguenin, MD .  saccharomyces boulardii (FLORASTOR) capsule 250 mg, 250 mg, Oral, BID, Bonnielee Haff, MD, 250 mg at 11/29/17 1010 .  senna-docusate (Senokot-S) tablet 1 tablet, 1 tablet, Oral, QHS PRN, Johnson-Pitts, Endia, MD .  sodium chloride flush (NS) 0.9 % injection 10-40 mL, 10-40 mL, Intracatheter, Q12H, Bonnielee Haff, MD, 10 mL at 11/28/17 2108 .  sodium chloride flush (NS) 0.9 % injection 10-40 mL, 10-40 mL, Intracatheter, PRN, Bonnielee Haff, MD, 10 mL at 11/18/17 0437 .  topiramate (TOPAMAX) tablet 25 mg, 25 mg, Oral, BID, Arrien, Jimmy Picket, MD, 25 mg at 11/29/17 1010  Patients Current Diet:  Diet Order            Diet - low sodium heart healthy        Diet renal with fluid restriction Fluid restriction: 1200 mL Fluid; Room service appropriate? Yes; Fluid consistency: Thin  Diet effective now              Precautions / Restrictions  Precautions Precautions: Fall Precaution Comments: seizures Restrictions Weight Bearing Restrictions: No   Has the patient had 2 or more falls or a fall with injury in the past year?No  Prior Activity Level Community (5-7x/wk): active ; independent without AD; did not drive  Development worker, international aid / Equipment Home Assistive Devices/Equipment: None Home Equipment: Environmental consultant - 2 wheels, Cane - single point, Civil engineer, contracting  Prior Device Use: Indicate devices/aids used by the patient prior to current illness, exacerbation or injury? None of the above  Prior Functional Level Prior Function Level of Independence: Independent Comments: per pt independent with ADls and mobility  Self Care: Did the patient need help bathing, dressing, using the toilet or eating?  Independent  Indoor Mobility: Did the patient need assistance with walking from room to room (with or without device)? Independent  Stairs: Did the patient need assistance with internal  or external stairs (with or without device)? Independent  Functional Cognition: Did the patient need help planning regular tasks such as shopping or remembering to take medications? Needed some help  Current Functional Level Cognition  Arousal/Alertness: Awake/alert Overall Cognitive Status: Within Functional Limits for tasks assessed Orientation Level: Oriented X4 Following Commands: Follows one step commands with increased time, Follows multi-step commands inconsistently Safety/Judgement: Decreased awareness of deficits, Decreased awareness of safety General Comments: alert, oriented x 4, eager for CIR and remembers son's visit yesterday Attention: Sustained Sustained Attention: Appears intact Memory: (TBA) Awareness: Impaired Awareness Impairment: Intellectual impairment Problem Solving: (TBA) Safety/Judgment: Impaired    Extremity Assessment (includes Sensation/Coordination)  Upper Extremity Assessment: LUE deficits/detail RUE Deficits / Details: grossly 4/5  LUE Deficits / Details: grossly 3+/5, slow and dysmetric   LUE Sensation: decreased light touch, decreased proprioception LUE Coordination: decreased fine motor, decreased gross motor  Lower Extremity Assessment: Defer to PT evaluation    ADLs  Overall ADL's : Needs assistance/impaired Eating/Feeding Details (indicate cue type and reason): was able to independently open twist top container lid with extra time and effort and drink with supervision Grooming: Oral care, Wash/dry hands, Wash/dry face, Sitting, Supervision/safety Grooming Details (indicate cue type and reason): Pt using both hands for both tasks with LUE moving more slowly then RUE. Seated in recliner (supported seating) Upper Body Bathing: Moderate assistance, Bed level Lower Body Bathing: Total assistance, +2 for physical assistance, Bed level Upper Body Dressing : Maximal assistance, Bed level Lower Body Dressing: Total assistance, +2 for physical  assistance, Bed level Lower Body Dressing Details (indicate cue type and reason): Pt able to cross her legs to get to her socks to doff and donn her socks Toilet Transfer: Moderate assistance, Stand-pivot, BSC Toilet Transfer Details (indicate cue type and reason): simulated with EOB>recliner Toileting- Clothing Manipulation and Hygiene: Total assistance Toileting - Clothing Manipulation Details (indicate cue type and reason): with Mod A sit<>stand Functional mobility during ADLs: Maximal assistance, +2 for physical assistance General ADL Comments: bed mobility, sat EOB about 2-3 minutes then SPT to recliner. Pt then completed grooming tasks.     Mobility  Overal bed mobility: Needs Assistance Bed Mobility: Supine to Sit Rolling: Min guard Sidelying to sit: Min assist, HOB elevated Supine to sit: Supervision General bed mobility comments: use of rail, increased time    Transfers  Overall transfer level: Needs assistance Equipment used: Rolling walker (2 wheeled) Transfers: Sit to/from Stand Sit to Stand: Min assist Stand pivot transfers: Mod assist General transfer comment: assist for balance, safety, cues throughout for hand placement    Ambulation / Gait /  Stairs / Emergency planning/management officer  Ambulation/Gait Ambulation/Gait assistance: Mod assist Gait Distance (Feet): 65 Feet Assistive device: Rolling walker (2 wheeled) Gait Pattern/deviations: Step-to pattern, Step-through pattern, Decreased stride length, Wide base of support, Ataxic General Gait Details: slow pace, ataxic on L, increased time and occasional visual reliance for L LE progression, able to advance over time with less visual feedback Gait velocity: decreased    Posture / Balance Dynamic Sitting Balance Sitting balance - Comments: sitting EOB end of session with bed alarm and feet supported, able to reach for Nepro on her table no concerns, RN aware Balance Overall balance assessment: Needs assistance Sitting-balance  support: Feet supported Sitting balance-Leahy Scale: Good Sitting balance - Comments: sitting EOB end of session with bed alarm and feet supported, able to reach for Nepro on her table no concerns, RN aware Postural control: Left lateral lean, Posterior lean Standing balance support: Bilateral upper extremity supported Standing balance-Leahy Scale: Poor Standing balance comment: Requires BUE support for standing balance.  High Level Balance Comments: standing at sink with UE support forward and side step taps alternating feet for coordination more than balance    Special needs/care consideration BiPAP/CPAP n/a CPM n/a Continuous Drip IV n/a Dialysis new to hemodialysis this admit. outpt clipp Reading Tues Thurs sat second shift Life Vest n/a Oxygen n/a Special Bed n/a Trach Size n/a Wound Vac n/a Skin right groin site with moisture associated skin damage and dehisced cath site Bowel mgmt: some incontinence LBM 10/14 Bladder mgmt: incontinence Diabetic mgmt yes pta   Previous Home Environment Living Arrangements: (pt lives with her son, [name])  Lives With: Son Available Help at Discharge: (son thinks an AUnt could assist when he works) Type of Home: Apartment Home Layout: One level Home Access: Level entry [employer] Shower/Tub: Multimedia programmer: Standard Bathroom Accessibility: Yes How Accessible: Accessible via walker Home Care Services: No  Discharge Living Setting Plans for Discharge Living Setting: Lives with (comment), Apartment(lives with son, [name]) Type of Home at Discharge: Apartment Discharge Home Layout: One level Discharge Home Access: Level entry Discharge Bathroom Shower/Tub: Walk-in shower Discharge Bathroom Toilet: Standard Discharge Bathroom Accessibility: Yes How Accessible: Accessible via walker Does the patient have any problems obtaining your medications?: No  Social/Family/Support Systems Patient Roles: Parent Contact Information:  [name], son Anticipated Caregiver: son, Aunt, maybe ALF Anticipated Caregiver's Contact Information: see above Ability/Limitations of Caregiver: son works at [employer]. on first shift he works 6 hrs; on second shift he works 7 hrs Caregiver Availability: Other (Comment) Discharge Plan Discussed with Primary Caregiver: Yes Is Caregiver In Agreement with Plan?: Yes Does Caregiver/Family have Issues with Lodging/Transportation while Pt is in Rehab?: No  Goals/Additional Needs Patient/Family Goal for Rehab: MOd I to supervision with PT, OT, and SLP Expected length of stay: ELOS 10 to 14 days Special Service Needs: new to hemodialysis this admission  Pt/Family Agrees to Admission and willing to participate: Yes Program Orientation Provided & Reviewed with Pt/Caregiver Including Roles  & Responsibilities: Yes  Decrease burden of Care through IP rehab admission: n/a  Possible need for SNF placement upon discharge:I spoke with son and his girlfriend on 10/14 extensively about discharge needs for initial 24/7 supervision at d/c. Son states he would like his Mom to get into ALF, [place] [facility], near their home. I discussed that there is no guarantee of her acceptance there. He states they have a family member connection who is trying to assist in getting her in after 2 weeks of rehab at  [facility]. I also explained that inpt rehab admit is to prevent the need for SNF rehab after the intensity of rehab at [facility]. He prefers inpt rehab with the plan to either get into ALF or plan d/c home with he and an Aunt providing assist as needed.   Patient Condition: This patient's medical and functional status has changed since the consult dated: 11/23/2017 in which the Rehabilitation Physician determined and documented that the patient's condition is appropriate for intensive rehabilitative care in an inpatient rehabilitation facility. See "History of Present Illness" (above) for medical update. Functional changes are:  min to mod assist. Patient's medical and functional status update has been discussed with the Rehabilitation physician and patient remains appropriate for inpatient rehabilitation. Will admit to inpatient rehab today.  Preadmission Screen Completed By:  Cleatrice Burke, 11/29/2017 2:37 PM ______________________________________________________________________   Discussed status with Dr. Posey Pronto on 11/29/2017 at  1446 and received telephone approval for admission today.  Admission Coordinator:  Cleatrice Burke, time 9784 Date 11/29/2017

## 2017-11-29 NOTE — [facility] Notes (Signed)
NURSING PROGRESS NOTE  Erica [name] [id number] Discharge Data: 11/29/2017 3:59 PM Attending Provider: Tawni Millers HDQ:QIWLNL, Caren Griffins DO     [name] to be D/C'd Rehab per MD order.  Discussed with the patient the After Visit Summary and all questions fully answered. All IV's discontinued with no bleeding noted. All belongings returned to patient for patient to take home.   Last Vital Signs:  Blood pressure (!) 165/82, pulse 91, temperature 99.4 F (37.4 C), temperature source Oral, resp. rate 18, height 5' (1.524 m), weight 47.8 kg, SpO2 100 %.  Discharge Medication List Allergies as of 11/29/2017      Reactions   Aspirin Palpitations      Medication List    STOP taking these medications   amLODipine 5 MG tablet Commonly known as:  NORVASC   cholecalciferol 1000 units tablet Commonly known as:  VITAMIN D   esomeprazole 40 MG capsule Commonly known as:  NEXIUM   hyoscyamine 0.125 MG Tbdp disintergrating tablet Commonly known as:  ANASPAZ   insulin aspart 100 UNIT/ML FlexPen Commonly known as:  NOVOLOG Replaced by:  insulin aspart 100 UNIT/ML injection   linaclotide 145 MCG Caps capsule Commonly known as:  LINZESS   magnesium oxide 400 MG tablet Commonly known as:  MAG-OX   metoCLOPramide 5 MG tablet Commonly known as:  REGLAN   metoprolol succinate 100 MG 24 hr tablet Commonly known as:  TOPROL-XL   mirtazapine 15 MG tablet Commonly known as:  REMERON   OxyCODONE HCl (Abuse Deter) 5 MG Taba Commonly known as:  OXAYDO   PARoxetine 20 MG tablet Commonly known as:  PAXIL   PROAIR HFA 108 (90 Base) MCG/ACT inhaler Generic drug:  albuterol   rOPINIRole 1 MG tablet Commonly known as:  REQUIP   sodium bicarbonate 650 MG tablet   TRESIBA FLEXTOUCH 200 UNIT/ML Sopn Generic drug:  Insulin Degludec     TAKE these medications   feeding supplement (NEPRO CARB STEADY) Liqd Take 237 mLs by mouth 2 (two) times daily between  meals.   insulin aspart 100 UNIT/ML injection Commonly known as:  novoLOG Inject 0-9 Units into the skin 3 (three) times daily with meals. For glucose 121-150 use one unit, for 151-200 use 2 units, for 201 to 250 use 3 units, for 251-300 use 5 units, for 301-350 use 7 units, for 351 or greater use 9 unnits. Replaces:  insulin aspart 100 UNIT/ML FlexPen   insulin glargine 100 UNIT/ML injection Commonly known as:  LANTUS Inject 0.05 mLs (5 Units total) into the skin daily. Start taking on:  11/30/2017   levETIRAcetam 1000 MG tablet Commonly known as:  KEPPRA Take 1 tablet (1,000 mg total) by mouth daily. Start taking on:  11/30/2017 What changed:  when to take this   levETIRAcetam 500 MG tablet Commonly known as:  KEPPRA Take 1 tablet (500 mg total) by mouth every Tuesday, Thursday, and Saturday at 6 PM. Start taking on:  11/30/2017 What changed:  You were already taking a medication with the same name, and this prescription was added. Make sure you understand how and when to take each.   multivitamin Tabs tablet Take 1 tablet by mouth at bedtime.   phenytoin 125 MG/5ML suspension Commonly known as:  DILANTIN Take 5 mLs (125 mg total) by mouth 2 (two) times daily.   topiramate 25 MG tablet Commonly known as:  TOPAMAX Take 1 tablet (25 mg total) by mouth 2 (two) times daily.

## 2017-11-29 NOTE — [facility] Notes (Addendum)
Inpatient Rehabilitation Admissions Coordinator  I received call back from Dr. Cathlean Sauer and pt's son, [name]. All in agreement to admit pt to inpt rehab today as well as pt herself. I will make the arrangements to admit today. RN CM made aware. Freda Munro in hemodialysis made aware of admit to in pt rehab today.   Danne Baxter, RN, MSN Rehab Admissions Coordinator [phone] 11/29/2017 1:01 PM

## 2017-11-29 NOTE — Progress Notes (Signed)
Erica Gong, RN  Rehab Admission Coordinator  Physical Medicine and Rehabilitation  PMR Pre-admission  Signed  Date of Service:  11/29/2017 2:37 PM       Related encounter: ED to Hosp-Admission (Discharged) from 11/08/2017 in [facility]      Signed         Show:Clear all [x] Manual[x] Template[x] Copied  Added by: [x] Erica Gong, RN  [] Hover for details PMR Admission Coordinator Pre-Admission Assessment  Patient: Erica [name] is an 62 y.o., female MRN: [id number] DOB: 1955-02-24 Height: 5' (152.4 cm) Weight: 47.8 kg                                                                                                                                                  Insurance Information  PRIMARY: Medicaid [place] Access      Policy#: [id number]      Subscriber: pt Benefits:  Phone #: [phone]     Name: 11/26/2017 Eff. Date: active     MADCY  Medicaid Application Date:       Case Manager:  Disability Application Date:       Case Worker:   Emergency Contact Information         Contact Information    Name Relation Home Work Mobile   [place] Son [phone]  [phone]   [employer] Daughter [phone]       Current Medical History  Patient Admitting Diagnosis: bilateral infarcts  History of Present Illness: Erica Romick Reynoldsis a 62 year old female with history of T2DMwith gastropathy and neuropathy, hypertension, seizure disorder,bilateral hydronephrosis status post stent who was admitted to an outside  hospital on 11/08/2017 for weakness acute on chronic renal failure with uremia, encephalopathy, poor p.o. intake with weakness,leukocytosis and question of UTI. She was transferred to [facility] for treatment and started on hemodialysis. Dr. 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  As patient with recurrent Erica [name],he recommended keeping patient on antibiotic coverage until stents removed.She was treated with 2-week course of IV ceftriaxone.  While in dialysis on09/29, she developed sided weakness left facial twitching and increase in lethargy.She was loaded with Keppra and Topamax added due to reports of left eye twitching for 2 days and concern for partial status epilepticus.MRI of brain done revealing scattered punctate foci of acute infarct in the deep white matter of both frontal and parietal vertices consistent with watershed infarct. MRA brain limited by motion but no emergent large vessel occlusion or stenosis noted. 2D echo showed normal LV size with moderate asymmetric basal septal hypertrophy --due to HTN versus hypertrophiccardiomyopathy and EF 60 to 65%.Dr. Leonel Ramsay felt thatmultifocal infarct due to hypo-perfusion and no further modification of medical regiment recommended.  She developed fevers on 10/1 and hemodialysis catheter removed. Blood culturenegative defervesced.Marland Kitchen  Right hemodialysis catheter placed on 10/07 by Dr. Belinda Block hemodialysis ongoing on Tuesday Thursday Saturday (accepted at [facility] TTS-12:20 PM slot).Treated with 1 unit PRBC.Therapy ongoing and patient limited by decreased processing, cognitive deficits with delayed processing, ataxia and LLE weakness.   Complete NIHSS TOTAL: 0  Past Medical History      Past Medical History:  Diagnosis Date  . Alcohol-induced pancreatitis   . Chronic diarrhea   . Depression   . Diabetes mellitus    fasting blood sugar 110-120s  . Diastolic CHF ([place])   . DKA (diabetic ketoacidoses) ([place])   . Gastroparesis   . GERD (gastroesophageal reflux disease)   . Heart murmur   . History of kidney stones   . Hyperlipidemia   . Hypertension   . Hypokalemia   . Muscle spasm   . Neuropathic pain   . Neuropathy    Hx: of  . Pyelonephritis   .  Recurrent pancreatitis   . Seizures ([place])   . Vitamin B12 deficiency   . Vitamin D deficiency     Family History  family history includes Diabetes in her sister.  Prior Rehab/Hospitalizations:  Has the patient had major surgery during 100 days prior to admission? Yes  Current Medications   Current Facility-Administered Medications:  .  0.9 %  sodium chloride infusion (Manually program via Guardrails IV Fluids), , Intravenous, Once, Elgergawy, Silver Huguenin, MD .  0.9 %  sodium chloride infusion, , Intravenous, PRN, Tat, David, MD, Last Rate: 10 mL/hr at 11/21/17 2323, 500 mL at 11/21/17 2323 .  acetaminophen (TYLENOL) tablet 650 mg, 650 mg, Oral, Q6H PRN, 650 mg at 11/27/17 2021 **OR** acetaminophen (TYLENOL) suppository 650 mg, 650 mg, Rectal, Q6H PRN, Johnson-Pitts, Endia, MD, 650 mg at 11/16/17 1339 .  bacitracin ointment, , Topical, BID, Bonnielee Haff, MD .  Chlorhexidine Gluconate Cloth 2 % PADS 6 each, 6 each, Topical, Q0600, Justin Mend, MD, 6 each at 11/29/17 0612 .  [START ON 12/02/2017] Darbepoetin Alfa (ARANESP) injection 150 mcg, 150 mcg, Intravenous, Q Thu-HD, Deterding, James, MD .  feeding supplement (NEPRO CARB STEADY) liquid 237 mL, 237 mL, Oral, BID BM, Elgergawy, Silver Huguenin, MD, 237 mL at 11/29/17 1414 .  ferric gluconate (NULECIT) 125 mg in sodium chloride 0.9 % 100 mL IVPB, 125 mg, Intravenous, Q T,Th,Sa-HD, Sanford, Ryan B, MD, Last Rate: 110 mL/hr at 11/27/17 1100, 125 mg at 11/27/17 1100 .  heparin injection 5,000 Units, 5,000 Units, Subcutaneous, Q8H, Bonnielee Haff, MD, 5,000 Units at 11/29/17 1414 .  insulin aspart (novoLOG) injection 0-9 Units, 0-9 Units, Subcutaneous, TID WC, Johnson-Pitts, Endia, MD, 2 Units at 11/29/17 1250 .  insulin glargine (LANTUS) injection 5 Units, 5 Units, Subcutaneous, Daily, Arrien, Jimmy Picket, MD, 5 Units at 11/29/17 1017 .  levETIRAcetam (KEPPRA) tablet 1,000 mg, 1,000 mg, Oral, Daily, Bonnielee Haff, MD, 1,000 mg  at 11/29/17 1010 .  levETIRAcetam (KEPPRA) tablet 500 mg, 500 mg, Oral, Q T,Th,Sat-1800, Arrien, Jimmy Picket, MD, 500 mg at 11/27/17 1720 .  loperamide (IMODIUM) capsule 2 mg, 2 mg, Oral, TID PRN, Bonnielee Haff, MD, 2 mg at 11/22/17 1325 .  multivitamin (RENA-VIT) tablet 1 tablet, 1 tablet, Oral, QHS, Deterding, James, MD .  ondansetron (ZOFRAN) tablet 4 mg, 4 mg, Oral, Q6H PRN **OR** ondansetron (ZOFRAN) injection 4 mg, 4 mg, Intravenous, Q6H PRN, Johnson-Pitts, Endia, MD, 4 mg at 11/11/17 1910 .  phenytoin (DILANTIN) 125 MG/5ML suspension 125 mg, 125 mg, Oral, BID, Bonnielee Haff, MD, 125 mg  at 11/29/17 1010 .  RESOURCE THICKENUP CLEAR, , Oral, PRN, Elgergawy, Silver Huguenin, MD .  saccharomyces boulardii (FLORASTOR) capsule 250 mg, 250 mg, Oral, BID, Bonnielee Haff, MD, 250 mg at 11/29/17 1010 .  senna-docusate (Senokot-S) tablet 1 tablet, 1 tablet, Oral, QHS PRN, Johnson-Pitts, Endia, MD .  sodium chloride flush (NS) 0.9 % injection 10-40 mL, 10-40 mL, Intracatheter, Q12H, Bonnielee Haff, MD, 10 mL at 11/28/17 2108 .  sodium chloride flush (NS) 0.9 % injection 10-40 mL, 10-40 mL, Intracatheter, PRN, Bonnielee Haff, MD, 10 mL at 11/18/17 0437 .  topiramate (TOPAMAX) tablet 25 mg, 25 mg, Oral, BID, Arrien, Jimmy Picket, MD, 25 mg at 11/29/17 1010  Patients Current Diet:     Diet Order                  Diet - low sodium heart healthy         Diet renal with fluid restriction Fluid restriction: 1200 mL Fluid; Room service appropriate? Yes; Fluid consistency: Thin  Diet effective now               Precautions / Restrictions Precautions Precautions: Fall Precaution Comments: seizures Restrictions Weight Bearing Restrictions: No   Has the patient had 2 or more falls or a fall with injury in the past year?No  Prior Activity Level Community (5-7x/wk): active ; independent without AD; did not drive  Development worker, international aid / Equipment Home Assistive  Devices/Equipment: None Home Equipment: Environmental consultant - 2 wheels, Cane - single point, Civil engineer, contracting  Prior Device Use: Indicate devices/aids used by the patient prior to current illness, exacerbation or injury? None of the above  Prior Functional Level Prior Function Level of Independence: Independent Comments: per pt independent with ADls and mobility  Self Care: Did the patient need help bathing, dressing, using the toilet or eating?  Independent  Indoor Mobility: Did the patient need assistance with walking from room to room (with or without device)? Independent  Stairs: Did the patient need assistance with internal or external stairs (with or without device)? Independent  Functional Cognition: Did the patient need help planning regular tasks such as shopping or remembering to take medications? Needed some help  Current Functional Level Cognition  Arousal/Alertness: Awake/alert Overall Cognitive Status: Within Functional Limits for tasks assessed Orientation Level: Oriented X4 Following Commands: Follows one step commands with increased time, Follows multi-step commands inconsistently Safety/Judgement: Decreased awareness of deficits, Decreased awareness of safety General Comments: alert, oriented x 4, eager for CIR and remembers son's visit yesterday Attention: Sustained Sustained Attention: Appears intact Memory: (TBA) Awareness: Impaired Awareness Impairment: Intellectual impairment Problem Solving: (TBA) Safety/Judgment: Impaired    Extremity Assessment (includes Sensation/Coordination)  Upper Extremity Assessment: LUE deficits/detail RUE Deficits / Details: grossly 4/5  LUE Deficits / Details: grossly 3+/5, slow and dysmetric   LUE Sensation: decreased light touch, decreased proprioception LUE Coordination: decreased fine motor, decreased gross motor  Lower Extremity Assessment: Defer to PT evaluation    ADLs  Overall ADL's : Needs  assistance/impaired Eating/Feeding Details (indicate cue type and reason): was able to independently open twist top container lid with extra time and effort and drink with supervision Grooming: Oral care, Wash/dry hands, Wash/dry face, Sitting, Supervision/safety Grooming Details (indicate cue type and reason): Pt using both hands for both tasks with LUE moving more slowly then RUE. Seated in recliner (supported seating) Upper Body Bathing: Moderate assistance, Bed level Lower Body Bathing: Total assistance, +2 for physical assistance, Bed level Upper Body Dressing : Maximal  assistance, Bed level Lower Body Dressing: Total assistance, +2 for physical assistance, Bed level Lower Body Dressing Details (indicate cue type and reason): Pt able to cross her legs to get to her socks to doff and donn her socks Toilet Transfer: Moderate assistance, Stand-pivot, BSC Toilet Transfer Details (indicate cue type and reason): simulated with EOB>recliner Toileting- Clothing Manipulation and Hygiene: Total assistance Toileting - Clothing Manipulation Details (indicate cue type and reason): with Mod A sit<>stand Functional mobility during ADLs: Maximal assistance, +2 for physical assistance General ADL Comments: bed mobility, sat EOB about 2-3 minutes then SPT to recliner. Pt then completed grooming tasks.     Mobility  Overal bed mobility: Needs Assistance Bed Mobility: Supine to Sit Rolling: Min guard Sidelying to sit: Min assist, HOB elevated Supine to sit: Supervision General bed mobility comments: use of rail, increased time    Transfers  Overall transfer level: Needs assistance Equipment used: Rolling walker (2 wheeled) Transfers: Sit to/from Stand Sit to Stand: Min assist Stand pivot transfers: Mod assist General transfer comment: assist for balance, safety, cues throughout for hand placement    Ambulation / Gait / Stairs / Wheelchair Mobility  Ambulation/Gait Ambulation/Gait assistance:  Mod assist Gait Distance (Feet): 65 Feet Assistive device: Rolling walker (2 wheeled) Gait Pattern/deviations: Step-to pattern, Step-through pattern, Decreased stride length, Wide base of support, Ataxic General Gait Details: slow pace, ataxic on L, increased time and occasional visual reliance for L LE progression, able to advance over time with less visual feedback Gait velocity: decreased    Posture / Balance Dynamic Sitting Balance Sitting balance - Comments: sitting EOB end of session with bed alarm and feet supported, able to reach for Nepro on her table no concerns, RN aware Balance Overall balance assessment: Needs assistance Sitting-balance support: Feet supported Sitting balance-Leahy Scale: Good Sitting balance - Comments: sitting EOB end of session with bed alarm and feet supported, able to reach for Nepro on her table no concerns, RN aware Postural control: Left lateral lean, Posterior lean Standing balance support: Bilateral upper extremity supported Standing balance-Leahy Scale: Poor Standing balance comment: Requires BUE support for standing balance.  High Level Balance Comments: standing at sink with UE support forward and side step taps alternating feet for coordination more than balance    Special needs/care consideration BiPAP/CPAP n/a CPM n/a Continuous Drip IV n/a Dialysis new to hemodialysis this admit. outpt clipp [place] Tues Thurs sat second shift Life Vest n/a Oxygen n/a Special Bed n/a Trach Size n/a Wound Vac n/a Skin right groin site with moisture associated skin damage and dehisced cath site Bowel mgmt: some incontinence LBM 10/14 Bladder mgmt: incontinence Diabetic mgmt yes pta   Previous Home Environment Living Arrangements: (pt lives with her son, [name])  Lives With: Son Available Help at Discharge: (son thinks an AUnt could assist when he works) Type of Home: Apartment Home Layout: One level Home Access: Level entry [employer] Shower/Tub:  Multimedia programmer: Standard Bathroom Accessibility: Yes How Accessible: Accessible via walker Home Care Services: No  Discharge Living Setting Plans for Discharge Living Setting: Lives with (comment), Apartment(lives with son, [name]) Type of Home at Discharge: Apartment Discharge Home Layout: One level Discharge Home Access: Level entry Discharge Bathroom Shower/Tub: Walk-in shower Discharge Bathroom Toilet: Standard Discharge Bathroom Accessibility: Yes How Accessible: Accessible via walker Does the patient have any problems obtaining your medications?: No  Social/Family/Support Systems Patient Roles: Parent Contact Information: [name], son Anticipated Caregiver: son, Aunt, maybe ALF Anticipated Caregiver's Contact Information: see above  Ability/Limitations of Caregiver: son works at [employer]. on first shift he works 6 hrs; on second shift he works 7 hrs Caregiver Availability: Other (Comment) Discharge Plan Discussed with Primary Caregiver: Yes Is Caregiver In Agreement with Plan?: Yes Does Caregiver/Family have Issues with Lodging/Transportation while Pt is in Rehab?: No  Goals/Additional Needs Patient/Family Goal for Rehab: MOd I to supervision with PT, OT, and SLP Expected length of stay: ELOS 10 to 14 days Special Service Needs: new to hemodialysis this admission  Pt/Family Agrees to Admission and willing to participate: Yes Program Orientation Provided & Reviewed with Pt/Caregiver Including Roles  & Responsibilities: Yes  Decrease burden of Care through IP rehab admission: n/a  Possible need for SNF placement upon discharge:I spoke with son and his girlfriend on 10/14 extensively about discharge needs for initial 24/7 supervision at d/c. Son states he would like his Mom to get into ALF, [place] [facility], near their home. I discussed that there is no guarantee of her acceptance there. He states they have a family member connection who is trying to  assist in getting her in after 2 weeks of rehab at [facility]. I also explained that inpt rehab admit is to prevent the need for SNF rehab after the intensity of rehab at [facility]. He prefers inpt rehab with the plan to either get into ALF or plan d/c home with he and an Aunt providing assist as needed.   Patient Condition: This patient's medical and functional status has changed since the consult dated: 11/23/2017 in which the Rehabilitation Physician determined and documented that the patient's condition is appropriate for intensive rehabilitative care in an inpatient rehabilitation facility. See "History of Present Illness" (above) for medical update. Functional changes are: min to mod assist. Patient's medical and functional status update has been discussed with the Rehabilitation physician and patient remains appropriate for inpatient rehabilitation. Will admit to inpatient rehab today.  Preadmission Screen Completed By:  Cleatrice Burke, 11/29/2017 2:37 PM ______________________________________________________________________   Discussed status with Dr. Posey Pronto on 11/29/2017 at  1446 and received telephone approval for admission today.  Admission Coordinator:  Cleatrice Burke, time 7124 Date 11/29/2017           Cosigned by: Jamse Arn, MD at 11/29/2017 3:11 PM  Revision History

## 2017-11-29 NOTE — Progress Notes (Signed)
Inpatient Rehabilitation Admissions Coordinator  I met with pt at bedside to again discuss an inpt rehab admit. She asks that I contact her son, [name], to discuss. I contacted Calvin by phone and spoke with he and his girlfriend about goals of an inpt rehab admit as follow up from our previous discussion last week. He has numerous medical questions and would like to discuss with Dr. Cathlean Sauer, before deciding inpt rehab admit vs SNF rehab for a longer recuperation rehab venue. I will contact Dr. Cathlean Sauer of son's request and phone number [phone], [name]. I will follow up with son after that discussion.  Danne Baxter, RN, MSN Rehab Admissions Coordinator [phone] 11/29/2017 12:00 PM

## 2017-11-29 NOTE — Progress Notes (Signed)
Subjective: Interval History: has no complaint , making urine, .  Objective: Vital signs in last 24 hours: Temp:  [98 F (36.7 C)-100.1 F (37.8 C)] 98.1 F (36.7 C) (10/14 0746) Pulse Rate:  [87-102] 87 (10/14 0746) Resp:  [16-18] 18 (10/14 0746) BP: (131-167)/(60-81) 131/67 (10/14 0746) SpO2:  [98 %-100 %] 100 % (10/14 0746) Weight change:   Intake/Output from previous day: 10/13 0701 - 10/14 0700 In: 720 [P.O.:720] Out: -  Intake/Output this shift: Total I/O In: 118 [P.O.:118] Out: -   General appearance: alert, cooperative and pale Resp: clear to auscultation bilaterally Chest wall: IJ PC Cardio: S1, S2 normal and systolic murmur: systolic ejection 2/6, crescendo and decrescendo at 2nd left intercostal space GI: soft, non-tender; bowel sounds normal; no masses,  no organomegaly Extremities: extremities normal, atraumatic, no cyanosis or edema  Lab Results: Recent Labs    11/27/17 0401  WBC 8.9  HGB 8.4*  HCT 27.6*  PLT 299   BMET:  Recent Labs    11/27/17 0401  NA 133*  K 3.7  CL 99  CO2 24  GLUCOSE 190*  BUN 30*  CREATININE 4.68*  CALCIUM 8.1*   No results for input(s): PTH in the last 72 hours. Iron Studies: No results for input(s): IRON, TIBC, TRANSFERRIN, FERRITIN in the last 72 hours.  Studies/Results: No results found.  I have reviewed the patient's current medications.  Assessment/Plan: 1 AKI/CKD4 some urine , check chem.  Limited ms mass so Cr not high 2 Anemia esa 3 Pyelo  On AB 4 Obstructive uropathy 5 HPTH check P check chem , plan TTS HD if needed.   LOS: 21 days   Kerie Badger 11/29/2017,9:08 AM

## 2017-11-29 NOTE — Progress Notes (Signed)
Jamse Arn, MD  Physician  Physical Medicine and Rehabilitation  Consult Note  Signed  Date of Service:  11/23/2017 5:56 AM       Related encounter: ED to Hosp-Admission (Discharged) from 11/08/2017 in [place] [facility]      Signed      Expand All Collapse All    Show:Clear all [x] Manual[x] Template[] Copied  Added by: [x] Angiulli, Lavon Paganini, PA-C[x] Jamse Arn, MD  [] Hover for details      Physical Medicine and Rehabilitation Consult Reason for Consult: Left-sided weakness Referring Physician: [facility]   HPI: Erica [name] is a 62 y.o. right-handed female with history of diabetes mellitus, diastolic congestive heart failure, hypertension, alcohol induced pancreatitis, seizure disorder maintained on Keppra.  Per chart review and patient, patient lives with son in [place].  Patient was using a walker prior to admission.  One level home.  Son works during the day.  She has a daughter that checks on her as needed. Patient with recent admission to [facility] in [place] for 12 days for ATN and emphysematous pyelonephritis treated with Zosyn and ureteral stents placed and was discharged 10/21/2017.  Presented 11/08/2017 to [facility] with generalized weakness and increasing confusion as well as abdominal pain and dysuria.  WBC 15,800, creatinine 11.93 from baseline of 4.00 on recent discharge.  CT renal stone study/renal ultrasound showed bilateral ureteral stents in place no hydronephrosis.  Probable small bilateral renal calculi versus residual contrast from recent study.  Nephrology as well as urology consulted and dialysis initiated.  On 11/14/2017 patient with twitching of her left side and became more lethargic.  She was transferred to [facility] for further evaluation.  MRI reviewed, showing bilateral infarcts.  Per report, scattered punctate foci of acute infarction manifest as foci of restricted diffusion within the  deep white matter of both frontal parietal vertices findings consistent with watershed infarction.  EEG consist of intermittent short periods of attenuation that have superimposed beta activity with higher voltage over the right central parietal region.  Neurology follow-up patient's Keppra was adjusted and Dilantin added to regimen.  Echocardiogram with ejection fraction of 16% grade 1 diastolic dysfunction.  Hemodialysis currently ongoing with follow-up per renal services.  Current plan is to keep ureteral stents in place and follow-up outpatient for removal per Dr. Jeffie Pollock of urology services.  Subcutaneous heparin for DVT prophylaxis.  Therapy evaluations completed with recommendations of physical medicine rehab consult.   Review of Systems  HENT: Negative for hearing loss.   Eyes: Negative for blurred vision and double vision.  Respiratory: Positive for shortness of breath. Negative for cough.   Cardiovascular: Negative for chest pain and palpitations.  Gastrointestinal: Positive for abdominal pain and nausea. Negative for vomiting.       GERD  Genitourinary: Negative for dysuria and hematuria.  Musculoskeletal: Positive for myalgias.  Neurological: Positive for sensory change, seizures and weakness.  Psychiatric/Behavioral: Positive for depression.  All other systems reviewed and are negative.      Past Medical History:  Diagnosis Date  . Alcohol-induced pancreatitis   . Chronic diarrhea   . Depression   . Diabetes mellitus    fasting blood sugar 110-120s  . Diastolic CHF ([place])   . DKA (diabetic ketoacidoses) ([place])   . Gastroparesis   . GERD (gastroesophageal reflux disease)   . Heart murmur   . History of kidney stones   . Hyperlipidemia   . Hypertension   . Hypokalemia   . Muscle  spasm   . Neuropathic pain   . Neuropathy    Hx: of  . Pyelonephritis   . Recurrent pancreatitis   . Seizures ([place])   . Vitamin B12 deficiency   . Vitamin D deficiency          Past Surgical History:  Procedure Laterality Date  . CATARACT EXTRACTION W/PHACO Right 03/14/2015   Procedure: CATARACT EXTRACTION PHACO AND INTRAOCULAR LENS PLACEMENT (IOC);  Surgeon: Baruch Goldmann, MD;  Location: [facility];  Service: Ophthalmology;  Laterality: Right;  CDE:11.13  . CATARACT EXTRACTION W/PHACO Left 04/11/2015   Procedure: CATARACT EXTRACTION PHACO AND INTRAOCULAR LENS PLACEMENT LEFT EYE CDE=9.68;  Surgeon: Baruch Goldmann, MD;  Location: [facility];  Service: Ophthalmology;  Laterality: Left;  . COLONOSCOPY  02/24/2010  . cystoscopy with ureteral stent  Bilateral 10/07/2017   At [place] CV LINE RIGHT  11/17/2017  . IR US GUIDE VASC ACCESS RIGHT  11/17/2017  . MULTIPLE EXTRACTIONS WITH ALVEOLOPLASTY N/A 06/13/2012   Procedure: MULTIPLE EXTRACION WITH ALVEOLOPLASTY EXTRACT: 18, 19, 20, 21, 22, 24, 25, 27, 28, 29, 30, 31;  Surgeon: Gae Bon, DDS;  Location: [facility];  Service: Oral Surgery;  Laterality: N/A;  . TUBAL LIGATION    . URETERAL STENT PLACEMENT  09/2017        Family History  Problem Relation Age of Onset  . Diabetes Sister   . Chronic Renal Failure Neg Hx    Social History:  reports that she has never smoked. Her smokeless tobacco use includes snuff. She reports that she drinks about 1.0 standard drinks of alcohol per week. She reports that she does not use drugs. Allergies:      Allergies  Allergen Reactions  . Aspirin Palpitations         Medications Prior to Admission  Medication Sig Dispense Refill  . albuterol (PROAIR HFA) 108 (90 BASE) MCG/ACT inhaler Inhale 2 puffs into the lungs every 6 (six) hours as needed for wheezing or shortness of breath.    Marland Kitchen amLODipine (NORVASC) 5 MG tablet Take 5 mg by mouth daily.    . cholecalciferol (VITAMIN D) 1000 units tablet Take 1,000 Units by mouth daily.    Marland Kitchen esomeprazole (NEXIUM) 40 MG capsule Take 40 mg by mouth daily.     . hyoscyamine (ANASPAZ) 0.125 MG  TBDP disintergrating tablet Take 0.125 mg by mouth 4 (four) times daily -  with meals and at bedtime.     . insulin aspart (NOVOLOG) 100 UNIT/ML FlexPen Inject 3 Units into the skin 3 (three) times daily with meals. (Patient taking differently: Inject 4-10 Units into the skin 3 (three) times daily with meals. Take 15 minutes before meals) 3 mL 0  . Insulin Degludec (TRESIBA FLEXTOUCH) 200 UNIT/ML SOPN Inject 10 Units into the skin daily with breakfast.     . levETIRAcetam (KEPPRA) 1000 MG tablet Take 1 tablet (1,000 mg total) by mouth 2 (two) times daily. 60 tablet 0  . linaclotide (LINZESS) 145 MCG CAPS capsule Take 145 mcg by mouth daily before breakfast.    . magnesium oxide (MAG-OX) 400 MG tablet Take 400 mg by mouth daily.    . metoCLOPramide (REGLAN) 5 MG tablet Take 5 mg by mouth 4 (four) times daily.    . metoprolol succinate (TOPROL-XL) 100 MG 24 hr tablet Take 100 mg by mouth daily. Take with or immediately following a meal.    . mirtazapine (REMERON) 15 MG tablet Take 15 mg  by mouth at bedtime.    . OxyCODONE HCl, Abuse Deter, (OXAYDO) 5 MG TABA Take 5 mg by mouth every 12 (twelve) hours as needed for pain.     Marland Kitchen PARoxetine (PAXIL) 20 MG tablet Take 20 mg by mouth daily.    Marland Kitchen rOPINIRole (REQUIP) 1 MG tablet Take 2 mg by mouth daily. BETWEEN 7 PM AND 8 PM.     . sodium bicarbonate 650 MG tablet Take 650 mg by mouth 4 (four) times daily.      Home: Home Living Family/patient expects to be discharged to:: Private residence Living Arrangements: Children Available Help at Discharge: Family Home Access: Level entry [place]: One level Bathroom Shower/Tub: [place]: Environmental consultant - 2 wheels, Radio producer - single point, Civil engineer, contracting  Lives With: Son  Functional History: Prior Function Level of Independence: Independent Comments: per pt independent with ADls and mobility Functional Status:  Mobility: Bed Mobility Overal bed mobility: Needs  Assistance Bed Mobility: Rolling, Sidelying to Sit Rolling: Min guard Sidelying to sit: HOB elevated, Min assist General bed mobility comments: Step by step cues to get to EOB, use of rail. Transfers Overall transfer level: Needs assistance Equipment used: 1 person hand held assist Transfers: Sit to/from Stand Sit to Stand: Min assist Stand pivot transfers: Mod assist General transfer comment: Stood from EOB x2, from [facility] x2, SPT bed to/from [facility] with mod A. Assist for weight shifting to advance LEs. HR up to 123 bpm. Ambulation/Gait Ambulation/Gait assistance: Mod assist, +2 physical assistance Gait Distance (Feet): 40 Feet Assistive device: 2 person hand held assist Gait Pattern/deviations: Step-through pattern, Ataxic, Leaning posteriorly, Decreased stride length, Decreased step length - right General Gait Details: Slow, unsteady gait with ataxic like gait pattern through LUE/LE; assist with weight shifting to advance LEs; right lateral and posterior trunk lean. Gait velocity: decreased  ADL: ADL Overall ADL's : Needs assistance/impaired Eating/Feeding Details (indicate cue type and reason): was able to independently hold thickened juice cup with taking her meds Grooming: Oral care, Brushing hair, Set up, Supervision/safety Grooming Details (indicate cue type and reason): Pt using both hands for both tasks with LUE moving more slowly then RUE Upper Body Bathing: Moderate assistance, Bed level Lower Body Bathing: Total assistance, +2 for physical assistance, Bed level Upper Body Dressing : Maximal assistance, Bed level Lower Body Dressing: Total assistance, +2 for physical assistance, Bed level Lower Body Dressing Details (indicate cue type and reason): Pt able to cross her legs to get to her socks to doff and donn her socks Toilet Transfer: Moderate assistance, Stand-pivot, BSC Toilet Transfer Details (indicate cue type and reason): deferred  Toileting- Clothing Manipulation and  Hygiene: Total assistance Toileting - Clothing Manipulation Details (indicate cue type and reason): with Mod A sit<>stand Functional mobility during ADLs: Maximal assistance, +2 for physical assistance General ADL Comments: bed mobility only, limited eval due to transport   Cognition: Cognition Overall Cognitive Status: No family/caregiver present to determine baseline cognitive functioning Arousal/Alertness: Awake/alert Orientation Level: Oriented X4 Attention: Sustained Sustained Attention: Appears intact Memory: (TBA) Awareness: Impaired Awareness Impairment: Intellectual impairment Problem Solving: (TBA) Safety/Judgment: Impaired Cognition Arousal/Alertness: Awake/alert Behavior During Therapy: Flat affect Overall Cognitive Status: No family/caregiver present to determine baseline cognitive functioning Area of Impairment: Problem solving, Following commands Orientation Level: Disoriented to, Time Memory: Decreased short-term memory Following Commands: Follows one step commands with increased time, Follows multi-step commands inconsistently Safety/Judgement: Decreased awareness of deficits, Decreased awareness of safety Awareness: Intellectual Problem Solving: Slow processing, Difficulty sequencing,  Decreased initiation, Requires verbal cues General Comments: Difficulty with motor planning, sequencing. Knows it is October, "1919"  Blood pressure 131/79, pulse 100, temperature 98.9 F (37.2 C), temperature source Oral, resp. rate 16, height 5' (1.524 m), weight 56 kg, SpO2 98 %. Physical Exam  Vitals reviewed. Constitutional: She appears well-developed.  62 year old right-handed frail African-American female  HENT:  Head: Normocephalic and atraumatic.  Eyes: EOM are normal. Right eye exhibits no discharge. Left eye exhibits no discharge.  Neck: Normal range of motion. Neck supple. No thyromegaly present.  Cardiovascular: Normal rate and regular rhythm.  Respiratory:  Effort normal and breath sounds normal. No respiratory distress.  GI: Soft. Bowel sounds are normal. She exhibits no distension.  Musculoskeletal:  No edema or tenderness in extremities  Neurological: She is alert.  Makes good eye contact with examiner.   She provides her name and age.   Follows simple commands Motor: 4+/5 grossly throughout Sensation diminished to light touch bilateral feet Left main No ataxia/dysmetria bilateral upper extremities, however slowed and measured  Skin: Skin is warm and dry.  Psychiatric: Her affect is blunt. She is slowed.    LabResultsLast24Hours       Results for orders placed or performed during the hospital encounter of 11/08/17 (from the past 24 hour(s))  Glucose, capillary     Status: Abnormal   Collection Time: 11/22/17  6:08 AM  Result Value Ref Range   Glucose-Capillary 137 (H) 70 - 99 mg/dL  Protime-INR     Status: Abnormal   Collection Time: 11/22/17  7:43 AM  Result Value Ref Range   Prothrombin Time 17.1 (H) 11.4 - 15.2 seconds   INR 1.41   Glucose, capillary     Status: None   Collection Time: 11/22/17  1:18 PM  Result Value Ref Range   Glucose-Capillary 79 70 - 99 mg/dL  Glucose, capillary     Status: None   Collection Time: 11/22/17  5:19 PM  Result Value Ref Range   Glucose-Capillary 92 70 - 99 mg/dL  Glucose, capillary     Status: Abnormal   Collection Time: 11/22/17  9:31 PM  Result Value Ref Range   Glucose-Capillary 156 (H) 70 - 99 mg/dL   Comment 1 Notify RN    Comment 2 Document in Chart      ImagingResults(Last48hours)  No results found.    Assessment/Plan: Diagnosis: Bilateral infarcts Labs and images (see above) independently reviewed.  Records reviewed and summated above. Stroke: Continue secondary stroke prophylaxis and Risk Factor Modification listed below:   Antiplatelet therapy:   Blood Pressure Management:  Continue current medication with prn's with permisive HTN per  primary team Statin Agent:   Diabetes management:    1. Does the need for close, 24 hr/day medical supervision in concert with the patient's rehab needs make it unreasonable for this patient to be served in a less intensive setting? Yes  2. Co-Morbidities requiring supervision/potential complications: DM (Monitor in accordance with exercise and adjust meds as necessary), diastolic congestive heart failure (monitor for signs and symptoms of fluid overload), HTN (monitor and provide prns in accordance with increased physical exertion and pain), alcohol induced pancreatitis, seizure (continue meds) 3. Due to safety, disease management and patient education, does the patient require 24 hr/day rehab nursing? Yes 4. Does the patient require coordinated care of a physician, rehab nurse, PT (1-2 hrs/day, 5 days/week) and OT (1-2 hrs/day, 5 days/week) to address physical and functional deficits in the context of the above medical diagnosis(es)?  Yes Addressing deficits in the following areas: balance, endurance, locomotion, transferring, bathing, dressing, toileting and psychosocial support 5. Can the patient actively participate in an intensive therapy program of at least 3 hrs of therapy per day at least 5 days per week? Yes 6. The potential for patient to make measurable gains while on inpatient rehab is excellent 7. Anticipated functional outcomes upon discharge from inpatient rehab are supervision  with PT, supervision with OT, n/a with SLP. 8. Estimated rehab length of stay to reach the above functional goals is: 14-18 days. 9. Anticipated D/C setting: Home 10. Anticipated post D/C treatments: HH therapy and Home excercise program 11. Overall Rehab/Functional Prognosis: good  RECOMMENDATIONS: This patient's condition is appropriate for continued rehabilitative care in the following setting: CIR Patient has agreed to participate in recommended program. Yes Note that insurance prior authorization may  be required for reimbursement for recommended care.  Comment: Rehab Admissions Coordinator to follow up.   I have personally performed a face to face diagnostic evaluation, including, but not limited to relevant history and physical exam findings, of this patient and developed relevant assessment and plan.  Additionally, I have reviewed and concur with the physician assistant's documentation above.   Delice Lesch, MD, ABPMR Lavon Paganini Angiulli, PA-C 11/23/2017        Revision History                        Routing History

## 2017-11-29 NOTE — Progress Notes (Signed)
Patient ID: Erica [name], female   DOB: [birth year], 62 y.o.   MRN: [id number] Patient admitted to [phone] via wheelchair, escorted by nursing staff.  Patient verbalized understanding of rehab process, discussed fall prevention safety plan, personal belongings policy, and visitation policy.  Patient appears to be in no immediate distress at this time.  Brita Romp, RN

## 2017-11-29 NOTE — Discharge Summary (Signed)
Physician Discharge Summary  Erica [name] WNU:[id number] DOB: 1956/02/16 DOA: 11/08/2017  PCP: Octavio Graves, DO  Admit date: 11/08/2017 Discharge date: 11/29/2017  Admitted From: Home  Disposition:  CIR  Recommendations for Outpatient Follow-up and new medication changes:  1. Follow up with Dr. Melina Copa in 7 days 2. Patient is scheduled to have hemodialysis T-TH-Sat as needed, follow up with nephrology. 3. Patient needs follow up appointment with urology Dr Vernard Gambles [facility]) for ureteral stents removal, appointment as soon as possible.   4. Patient has been placed on keppra, phenytoin and topiramate for seizures.   Home Health: na  Equipment/Devices: na    Discharge Condition: stable  CODE STATUS: full  Diet recommendation: hearth healthy, renal and diabetic prudent.   Brief/Interim Summary: 62 year old female who presented with weakness. She does have the significant past medical history of type 2 diabetes mellitus, hypertension and seizure disorder. Recent hospitalization for 12 days, treated for pyelonephritis and acute tubular necrosis.Had ureteral stents placed.Patient complained of lower abdominal pain and dysuria,associated with poor oral intake for the last 2 dayspriorto admission.On the initial physical examination blood pressure 125/69, heart rate 84, respiratory rate 16, temperature 98, oxygen saturation 99%.Moist mucous membranes, lungs clear to auscultation bilaterally, heart S2 present rhythmic, abdomen soft nontender, no lower extremity edema.Sodium 137, potassium 5.3, chloride 113, bicarb 12, glucose 214, BUN 81, creatinine 11.9, white count 15.8, hemoglobin 10.1, hematocrit 32.1, platelets 533.Urinalysis with 100 protein, greater than 50 red cells, greater than 50 white cells, total protein 481, protein creatinine ratio 10.4.  Patient was admitted to the hospital with working diagnosis of acute kidney injury with uremia and anion gap metabolic acidosis,  complicated with urine infection/pyelonephritis..   1.  Acute kidney injury on chronic kidney disease stage IV/V.  Patient was admitted to the medical ward, she was placed on a remote telemetry monitor, she received supportive medical therapy with IV fluids and IV serum bicarb but she had persistent decreased GFR that required hemodialysis.  Patient had a right internal jugular vein hemodialysis catheter placed and currently scheduled to have dialysis Tuesday Thursday Saturday.  She is not end-stage renal disease yet, nephrology will follow up for further hemodialysis treatments.  Still anuric at discharge.  Her potassium is 4.9, serum bicarb 26, creatinine 3.57, BUN 21.  Last hemodialysis Saturday October 12.   2.  Acute pyelonephritis (present on admission/ no sepsis.  Patient was treated with antibiotic therapy with IV ceftriaxone, a 2 weeks course.  No further fever or leukocytosis.  Dr. Alyson Ingles from urology was consulted, and he recommended no indication for stent removal during this admission, follow-up as an outpatient with urology.  3.  Seizures. Patient developed generalized seizures while on dialysis September 29.  Patient was seen by neurology, further work-up with electroencephalography was performed which was abnormal with slow rhythm with higher voltage over the right central parietal region.  Patient was placed on phenytoin, Keppra and topiramate.   4.  Acute CVA, scattered punctate infarction within the deep white matter in both frontoparietal vertices.  As part of the seizure work-up he underwent MRI which showed ischemic infarcts, and patient had subtle left-sided weakness.  She was evaluated by physical therapy, occupational therapy and neurology, recommendation to continue therapy at inpatient rehab.  Continue blood pressure control.  5.  Type 2 diabetes mellitus.  She had episodes of hypoglycemia, her insulin regimen was adjusted, currently patient on sliding scale insulin and 5  units of Levemir.  Continue close follow-up on capillary glucose.  6.  Hypertension.  Blood pressure has remained stable off antihypertensive agents, continue ultrafiltration as needed.  7.  Severe calorie protein malnutrition.  She was placed on nutritional supplements.  8.  Anemia chronic renal disease, patient received 1 unit packed red blood cells, continue Aranesp, patient follow-up on blood cell count.  Discharge Diagnoses:  Principal Problem:   Renal failure Active Problems:   Diabetes mellitus ([place])   Metabolic acidosis   Protein-calorie malnutrition, severe ([place])   Seizure disorder ([place])   Acute renal failure superimposed on chronic kidney disease ([place])   Uncontrolled type 2 diabetes mellitus with hyperglycemia, with long-term current use of insulin ([facility])   Acute pyelonephritis   PICC (peripherally inserted central catheter) in place   AKI (acute kidney injury) ([place])   Chronic diastolic congestive heart failure ([place])   Essential hypertension    Discharge Instructions   Allergies as of 11/29/2017      Reactions   Aspirin Palpitations      Medication List    STOP taking these medications   amLODipine 5 MG tablet Commonly known as:  NORVASC   cholecalciferol 1000 units tablet Commonly known as:  VITAMIN D   esomeprazole 40 MG capsule Commonly known as:  NEXIUM   hyoscyamine 0.125 MG Tbdp disintergrating tablet Commonly known as:  ANASPAZ   insulin aspart 100 UNIT/ML FlexPen Commonly known as:  NOVOLOG Replaced by:  insulin aspart 100 UNIT/ML injection   linaclotide 145 MCG Caps capsule Commonly known as:  LINZESS   magnesium oxide 400 MG tablet Commonly known as:  MAG-OX   metoCLOPramide 5 MG tablet Commonly known as:  REGLAN   metoprolol succinate 100 MG 24 hr tablet Commonly known as:  TOPROL-XL   mirtazapine 15 MG tablet Commonly known as:  REMERON   OxyCODONE HCl (Abuse Deter) 5 MG Taba Commonly known as:  OXAYDO   PARoxetine 20 MG  tablet Commonly known as:  PAXIL   PROAIR HFA 108 (90 Base) MCG/ACT inhaler Generic drug:  albuterol   rOPINIRole 1 MG tablet Commonly known as:  REQUIP   sodium bicarbonate 650 MG tablet   TRESIBA FLEXTOUCH 200 UNIT/ML Sopn Generic drug:  Insulin Degludec     TAKE these medications   feeding supplement (NEPRO CARB STEADY) Liqd Take 237 mLs by mouth 2 (two) times daily between meals.   insulin aspart 100 UNIT/ML injection Commonly known as:  novoLOG Inject 0-9 Units into the skin 3 (three) times daily with meals. For glucose 121-150 use one unit, for 151-200 use 2 units, for 201 to 250 use 3 units, for 251-300 use 5 units, for 301-350 use 7 units, for 351 or greater use 9 unnits. Replaces:  insulin aspart 100 UNIT/ML FlexPen   insulin glargine 100 UNIT/ML injection Commonly known as:  LANTUS Inject 0.05 mLs (5 Units total) into the skin daily. Start taking on:  11/30/2017   levETIRAcetam 1000 MG tablet Commonly known as:  KEPPRA Take 1 tablet (1,000 mg total) by mouth daily. Start taking on:  11/30/2017 What changed:  when to take this   levETIRAcetam 500 MG tablet Commonly known as:  KEPPRA Take 1 tablet (500 mg total) by mouth every Tuesday, Thursday, and Saturday at 6 PM. Start taking on:  11/30/2017 What changed:  You were already taking a medication with the same name, and this prescription was added. Make sure you understand how and when to take each.   multivitamin Tabs tablet Take 1 tablet by mouth at bedtime.  phenytoin 125 MG/5ML suspension Commonly known as:  DILANTIN Take 5 mLs (125 mg total) by mouth 2 (two) times daily.   topiramate 25 MG tablet Commonly known as:  TOPAMAX Take 1 tablet (25 mg total) by mouth 2 (two) times daily.       Allergies  Allergen Reactions  . Aspirin Palpitations    Consultations:  Neurology   Nephrology    Procedures/Studies: Mr Virgel Paling AX Contrast  Result Date: 11/17/2017 CLINICAL DATA:  Confusion for  2-3 days. History of hypertension, diabetes and seizures. EXAM: MRA HEAD WITHOUT CONTRAST TECHNIQUE: Angiographic images of the Circle of Willis were obtained using MRA technique without intravenous contrast. COMPARISON:  MRI head November 15, 2017 FINDINGS: Mild motion degraded examination. ANTERIOR CIRCULATION: Normal flow related enhancement of the included cervical, petrous, cavernous and supraclinoid internal carotid arteries. Patent anterior communicating artery. Patent anterior and middle cerebral arteries, mild luminal irregularity. No large vessel occlusion, flow limiting stenosis, aneurysm. POSTERIOR CIRCULATION: RIGHT vertebral artery is dominant. Vertebrobasilar arteries are patent, with normal flow related enhancement of the main branch vessels. Stenotic RIGHT P1 segment with predominant RIGHT vascular supply provided by posterior communicating artery. Small LEFT posterior communicating artery present. Patent posterior cerebral arteries, mild luminal irregularity. No large vessel occlusion, flow limiting stenosis,  aneurysm. ANATOMIC VARIANTS: None. Source images and MIP images were reviewed. IMPRESSION: 1. Motion degraded examination. No emergent large vessel occlusion or flow-limiting stenosis. 2. Mild intracranial atherosclerosis versus motion artifact. Electronically Signed   By: Elon Alas M.D.   On: 11/17/2017 22:24   Mr Brain Wo Contrast  Result Date: 11/15/2017 CLINICAL DATA:  Acute presentation with left-sided weakness and facial droop. EXAM: MRI HEAD WITHOUT CONTRAST TECHNIQUE: Multiplanar, multiecho pulse sequences of the brain and surrounding structures were obtained without intravenous contrast. COMPARISON:  CT 11/14/2017.  MRI 03/01/2008. FINDINGS: Brain: There are bilaterally symmetric punctate foci restricted diffusion indicating acute infarction in the deep white matter at the frontal parietal vertices. This most likely relates to watershed infarction, though embolic  infarction is not completely excluded. No cortical or large vessel territory infarction. No mass lesion, hemorrhage, hydrocephalus or extra-axial collection. There are extensive chronic small-vessel ischemic changes of the pons. No focal cerebellar insult. Cerebral hemispheres elsewhere show scattered foci of small-vessel change within the white matter. No evidence of mass lesion, hemorrhage, hydrocephalus or extra-axial collection. Vascular: Major vessels at the base of the brain show flow. Skull and upper cervical spine: Negative Sinuses/Orbits: Clear/normal Other: None IMPRESSION: Scattered punctate foci of acute infarction manifest as foci of restricted diffusion within the deep white matter at both frontal parietal vertices. 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 No evidence of cortical or large vessel territory infarction. Atrophy and chronic small-vessel ischemic changes elsewhere as described above, most prominently affecting the pons. Electronically Signed   By: Nelson Chimes M.D.   On: 11/15/2017 16:11   US Renal  Result Date: 11/09/2017 CLINICAL DATA:  62 year old female with chronic kidney disease stage 3. Subsequent encounter. EXAM: RENAL / URINARY TRACT ULTRASOUND COMPLETE COMPARISON:  11/08/2017 CT. FINDINGS: Right Kidney: Length: 10.1 cm. Minimal increased echogenicity. No hydronephrosis or mass. Left Kidney: Length: 10.5 cm. Echogenicity within normal limits. No mass or hydronephrosis visualized. Bladder: Debris within the bladder.  Bilateral ureteral stents in place. IMPRESSION: 1. Bilateral ureteral stents in place.  No hydronephrosis. 2. Debris within the urinary bladder. 3. Slight increased echogenicity right renal parenchyma, may reflect changes of medical  renal disease. Electronically Signed   By: Genia Del M.D.   On: 11/09/2017 14:54   Ir Fluoro Guide Cv Line Right  Result Date: 11/23/2017 INDICATION:  Right jugular temporary dialysis catheter in place. Permanent dialysis catheter placement requested. EXAM: RIGHT HEMODIALYSIS CATHETER TEMPORARY TO TUNNELED CONVERSION MEDICATIONS: Ancef 2 g; The antibiotic was administered within an appropriate time interval prior to skin puncture. ANESTHESIA/SEDATION: Moderate (conscious) sedation was employed during this procedure. A total of Versed 1 mg and Fentanyl 50 mcg was administered intravenously. Moderate Sedation Time: 11 minutes. The patient's level of consciousness and vital signs were monitored continuously by radiology nursing throughout the procedure under my direct supervision. FLUOROSCOPY TIME:  Fluoroscopy Time:  minutes 36 seconds (1 mGy). COMPLICATIONS: None immediate. PROCEDURE: Informed written consent was obtained from the patient after a thorough discussion of the procedural risks, benefits and alternatives. All questions were addressed. Maximal Sterile Barrier Technique was utilized including caps, mask, sterile gowns, sterile gloves, sterile drape, hand hygiene and skin antiseptic. A timeout was performed prior to the initiation of the procedure. The right neck was prepped and draped in a sterile fashion. 1% lidocaine was utilized for local anesthesia. The temporary dialysis catheter was removed over an Amplatz wire. An incision was made in the upper chest. A 19 cm tip to cuff dialysis catheter was advanced from the chest incision out the neck entry site. A peel-away sheath was advanced over the Amplatz wire. The leading edge of the dialysis catheter was advanced through the peel-away sheath. The peel-away sheath was removed. The neck incision was closed with a 4 0 Vicryl subcuticular stitch. The chest incision was closed with an 0 Prolene pursestring. It was flushed and instilled with heparinized saline. FINDINGS: Tip of the tunneled dialysis catheter is at the cavoatrial junction. IMPRESSION: Successful right jugular temporary to tunneled dialysis  catheter conversion. Electronically Signed   By: Marybelle Killings M.D.   On: 11/23/2017 07:11   Ir Fluoro Guide Cv Line Right  Result Date: 11/17/2017 INDICATION: End-stage renal disease. Please perform image guided placement of a temporary dialysis catheter for the initiation dialysis. EXAM: NON-TUNNELED CENTRAL VENOUS HEMODIALYSIS CATHETER PLACEMENT WITH ULTRASOUND AND FLUOROSCOPIC GUIDANCE COMPARISON:  None. MEDICATIONS: None FLUOROSCOPY TIME:  18 seconds (2 mGy) COMPLICATIONS: None immediate. PROCEDURE: Informed written consent was obtained from the patient after a discussion of the risks, benefits, and alternatives to treatment. Questions regarding the procedure were encouraged and answered. The right neck and chest were prepped with chlorhexidine in a sterile fashion, and a sterile drape was applied covering the operative field. Maximum barrier sterile technique with sterile gowns and gloves were used for the procedure. A timeout was performed prior to the initiation of the procedure. After the overlying soft tissues were anesthetized, a small venotomy incision was created and a micropuncture kit was utilized to access the internal jugular vein. Real-time ultrasound guidance was utilized for vascular access including the acquisition of a permanent ultrasound image documenting patency of the accessed vessel. The microwire was utilized to measure appropriate catheter length. A stiff glidewire was advanced to the level of the IVC. Under fluoroscopic guidance, the venotomy was serially dilated, ultimately allowing placement of a 20 cm temporary Trialysis catheter with tip ultimately terminating within the superior aspect of the right atrium. Final catheter positioning was confirmed and documented with a spot radiographic image. The catheter aspirates and flushes normally. The catheter was flushed with appropriate volume heparin dwells. The catheter exit site was secured with a 0-Prolene retention  suture. A  dressing was placed. The patient tolerated the procedure well without immediate post procedural complication. IMPRESSION: Successful placement of a right internal jugular approach 20 cm temporary dialysis catheter with tip terminating with in the superior aspect of the right atrium. The catheter is ready for immediate use. PLAN: This catheter may be converted to a tunneled dialysis catheter at a later date as indicated. Electronically Signed   By: Sandi Mariscal M.D.   On: 11/17/2017 12:06   Ir US Guide Vasc Access Right  Result Date: 11/17/2017 INDICATION: End-stage renal disease. Please perform image guided placement of a temporary dialysis catheter for the initiation dialysis. EXAM: NON-TUNNELED CENTRAL VENOUS HEMODIALYSIS CATHETER PLACEMENT WITH ULTRASOUND AND FLUOROSCOPIC GUIDANCE COMPARISON:  None. MEDICATIONS: None FLUOROSCOPY TIME:  18 seconds (2 mGy) COMPLICATIONS: None immediate. PROCEDURE: Informed written consent was obtained from the patient after a discussion of the risks, benefits, and alternatives to treatment. Questions regarding the procedure were encouraged and answered. The right neck and chest were prepped with chlorhexidine in a sterile fashion, and a sterile drape was applied covering the operative field. Maximum barrier sterile technique with sterile gowns and gloves were used for the procedure. A timeout was performed prior to the initiation of the procedure. After the overlying soft tissues were anesthetized, a small venotomy incision was created and a micropuncture kit was utilized to access the internal jugular vein. Real-time ultrasound guidance was utilized for vascular access including the acquisition of a permanent ultrasound image documenting patency of the accessed vessel. The microwire was utilized to measure appropriate catheter length. A stiff glidewire was advanced to the level of the IVC. Under fluoroscopic guidance, the venotomy was serially dilated, ultimately allowing  placement of a 20 cm temporary Trialysis catheter with tip ultimately terminating within the superior aspect of the right atrium. Final catheter positioning was confirmed and documented with a spot radiographic image. The catheter aspirates and flushes normally. The catheter was flushed with appropriate volume heparin dwells. The catheter exit site was secured with a 0-Prolene retention suture. A dressing was placed. The patient tolerated the procedure well without immediate post procedural complication. IMPRESSION: Successful placement of a right internal jugular approach 20 cm temporary dialysis catheter with tip terminating with in the superior aspect of the right atrium. The catheter is ready for immediate use. PLAN: This catheter may be converted to a tunneled dialysis catheter at a later date as indicated. Electronically Signed   By: Sandi Mariscal M.D.   On: 11/17/2017 12:06   Dg Chest Port 1 View  Result Date: 11/09/2017 CLINICAL DATA:  PICC line placement EXAM: PORTABLE CHEST 1 VIEW COMPARISON:  10/06/2017 FINDINGS: Left internal jugular PICC line is been placed with the tip at the cavoatrial junction. No pneumothorax. Heart is normal size. Lungs clear. No effusions or acute bony abnormality. IMPRESSION: Left PICC line tip at the cavoatrial junction.  No pneumothorax. Electronically Signed   By: Rolm Baptise M.D.   On: 11/09/2017 12:58   Dg Swallowing Func-speech Pathology  Result Date: 11/19/2017 Objective Swallowing Evaluation: Type of Study: MBS-Modified Barium Swallow Study  Patient Details Name: GRACIELYNN BIRKEL MRN: [id number] Date of Birth: 1955-04-15 Today's Date: 11/19/2017 Time: SLP Start Time (ACUTE ONLY): 3300 -SLP Stop Time (ACUTE ONLY): 0941 SLP Time Calculation (min) (ACUTE ONLY): 19 min Past Medical History: Past Medical History: Diagnosis Date . Alcohol-induced pancreatitis  . Chronic diarrhea  . Depression  . Diabetes mellitus   fasting blood sugar 110-120s . Diastolic CHF ([place])  .  DKA  (diabetic ketoacidoses) ([place])  . Gastroparesis  . GERD (gastroesophageal reflux disease)  . Heart murmur  . History of kidney stones  . Hyperlipidemia  . Hypertension  . Hypokalemia  . Muscle spasm  . Neuropathic pain  . Neuropathy   Hx: of . Pyelonephritis  . Recurrent pancreatitis  . Seizures ([place])  . Vitamin B12 deficiency  . Vitamin D deficiency  Past Surgical History: Past Surgical History: Procedure Laterality Date . CATARACT EXTRACTION W/PHACO Right 03/14/2015  Procedure: CATARACT EXTRACTION PHACO AND INTRAOCULAR LENS PLACEMENT (IOC);  Surgeon: Baruch Goldmann, MD;  Location: [facility];  Service: Ophthalmology;  Laterality: Right;  CDE:11.13 . CATARACT EXTRACTION W/PHACO Left 04/11/2015  Procedure: CATARACT EXTRACTION PHACO AND INTRAOCULAR LENS PLACEMENT LEFT EYE CDE=9.68;  Surgeon: Baruch Goldmann, MD;  Location: [facility];  Service: Ophthalmology;  Laterality: Left; . COLONOSCOPY  02/24/2010 . cystoscopy with ureteral stent  Bilateral 10/07/2017  At [place] CV LINE RIGHT  11/17/2017 . IR US GUIDE VASC ACCESS RIGHT  11/17/2017 . MULTIPLE EXTRACTIONS WITH ALVEOLOPLASTY N/A 06/13/2012  Procedure: MULTIPLE EXTRACION WITH ALVEOLOPLASTY EXTRACT: 18, 19, 20, 21, 22, 24, 25, 27, 28, 29, 30, 31;  Surgeon: Gae Bon, DDS;  Location: [facility];  Service: Oral Surgery;  Laterality: N/A; . TUBAL LIGATION   . URETERAL STENT PLACEMENT  09/2017 HPI: 62 year old female admitted 11/08/17 with weakness. Transferred to [facility] 11[facility]15/27 due to seizure activity. PMH: DM2, HTN, seizure disorder, alcohol induced pancreatitis, GERD, HLD. MRI = Scattered punctate foci of acute infarction, c/w watershed infarction. MBS recommended to objectively assess the swallowing function and determine LRD secondary to overt coughing on thin liquids and coughing with D2 textures following thin liquids during MBS 11/18/2017.  Subjective: Pt resting in bed. No family present Assessment / Plan / Recommendation CHL IP CLINICAL IMPRESSIONS  11/19/2017 Clinical Impression Pt presents with oropharyngeal function largely within functional limits with consistent and adequate glottic competence and airway protection noted throughout study. Pt is edentulous and presents with baseline cognitive deficits likely causing noted mildly prolonged bolus manipulation and transit time across consistencies and some lingual pumping. Mildly delayed swallow trigger to the level of the valleculae with solid textures and occasional premature spillage to the pyriforms with mixed consistencies and thin liquids when pressed to take large tablespoon bites of mixed consistencies or consecutive straw sips of thin liquids. Flash penetration of thin liquids reflexively ejected by the end of the swallow on all instances. Pt did not cough during the exam in contrast to presentation at bedside yesterday with the exception of one brief coughing episode with initial tsp trial of mixed consistency that was not visualized; no difficulty was noted with larger additional bites. Secondary to edentulous status recommend continue with dysphagia 2 diet/fine chop and upgrade to thin liquids. ST will follow up X1 for clinical diet check and to review aspiration precautions re: utlize slow rate, small sips and alternate bites and sips prior to signing off. SLP Visit Diagnosis Dysphagia, unspecified (R13.10) Attention and concentration deficit following -- Frontal lobe and executive function deficit following -- Impact on safety and function Mild aspiration risk   CHL IP TREATMENT RECOMMENDATION 11/19/2017 Treatment Recommendations Therapy as outlined in treatment plan below   Prognosis 11/19/2017 Prognosis for Safe Diet Advancement Fair Barriers to Reach Goals Cognitive deficits Barriers/Prognosis Comment -- CHL IP DIET RECOMMENDATION 11/19/2017 SLP Diet Recommendations Dysphagia 2 (Fine chop) solids;Thin liquid Liquid Administration via Straw;Cup Medication Administration Whole meds with liquid  Compensations Minimize environmental distractions;Small sips/bites;Slow rate;Follow solids with liquid Postural Changes Remain semi-upright after after feeds/meals (Comment);Seated upright at 90 degrees   CHL IP OTHER RECOMMENDATIONS 11/19/2017 Recommended Consults -- Oral Care Recommendations Oral care BID Other Recommendations --   CHL IP FOLLOW UP RECOMMENDATIONS 11/19/2017 Follow up Recommendations None   CHL IP FREQUENCY AND DURATION 11/19/2017 Speech Therapy Frequency (ACUTE ONLY) min 1 x/week Treatment Duration 2 weeks      CHL IP ORAL PHASE 11/19/2017 Oral Phase Impaired Oral - Pudding Teaspoon -- Oral - Pudding Cup -- Oral - Honey Teaspoon -- Oral - Honey Cup -- Oral - Nectar Teaspoon -- Oral - Nectar Cup -- Oral - Nectar Straw -- Oral - Thin Teaspoon WFL Oral - Thin Cup WFL Oral - Thin Straw Lingual pumping;Premature spillage Oral - Puree Lingual pumping;Reduced posterior propulsion Oral - Mech Soft Lingual pumping;Reduced posterior propulsion Oral - Regular -- Oral - Multi-Consistency -- Oral - Pill WFL Oral Phase - Comment --  CHL IP PHARYNGEAL PHASE 11/19/2017 Pharyngeal Phase -- Pharyngeal- Pudding Teaspoon -- Pharyngeal -- Pharyngeal- Pudding Cup -- Pharyngeal -- Pharyngeal- Honey Teaspoon -- Pharyngeal -- Pharyngeal- Honey Cup -- Pharyngeal -- Pharyngeal- Nectar Teaspoon -- Pharyngeal -- Pharyngeal- Nectar Cup -- Pharyngeal -- Pharyngeal- Nectar Straw -- Pharyngeal -- Pharyngeal- Thin Teaspoon -- Pharyngeal -- Pharyngeal- Thin Cup -- Pharyngeal -- Pharyngeal- Thin Straw Delayed swallow initiation-pyriform sinuses;Penetration/Aspiration before swallow Pharyngeal Material enters airway, remains ABOVE vocal cords then ejected out Pharyngeal- Puree WFL Pharyngeal -- Pharyngeal- Mechanical Soft -- Pharyngeal -- Pharyngeal- Regular WFL Pharyngeal -- Pharyngeal- Multi-consistency -- Pharyngeal -- Pharyngeal- Pill WFL Pharyngeal -- Pharyngeal Comment --  No flowsheet data found. Amelia H. Roddie Mc, CCC-SLP  Speech Language Pathologist Wende Bushy 11/19/2017, 11:31 AM              Ct Renal Stone Study  Result Date: 11/08/2017 CLINICAL DATA:  62 year old female with UTI. History of kidney stones. Displacement of indwelling urethral stent. EXAM: CT ABDOMEN AND PELVIS WITHOUT CONTRAST TECHNIQUE: Multidetector CT imaging of the abdomen and pelvis was performed following the standard protocol without IV contrast. COMPARISON:  CT of the abdomen pelvis dated 12/07/2012 FINDINGS: Evaluation of this exam is limited in the absence of intravenous contrast. Lower chest: Bibasilar linear atelectasis/scarring. There is calcification of the mitral annulus. There is hypoattenuation of the cardiac blood pool suggestive of a degree of anemia. Clinical correlation is recommended. No intra-abdominal free air. Small ascites and diffuse mesenteric edema. Hepatobiliary: The liver is unremarkable. There is mild intrahepatic biliary ductal dilatation as seen on the prior CT. The gallbladder is not visualized, likely surgically absent. Pancreas: The pancreas is atrophic with coarse calcifications likely sequela of chronic pancreatitis. Spleen: Normal in size without focal abnormality. Adrenals/Urinary Tract: The adrenal glands are unremarkable. Small faint hypodensities within the renal collecting systems on likely represent small nonobstructing calculi versus residual excreted contrast from recent IV administration. Clinical correlation is recommended. Bilateral pigtail ureteral stents noted. The proximal tip of the right ureteral stent is in the right renal upper pole collecting system and the proximal tip of the left ureteral stent is in the left renal pelvis. The distal ends of the stents are within the urinary bladder. There is no hydronephrosis on either side. Stomach/Bowel: There is diffuse thickened appearance of the wall of the stomach as well as thickened appearance of the colon, likely related to ascites. Gastritis or  colitis are considered less likely. Clinical correlation is recommended. There is no bowel obstruction.  The appendix is normal. Vascular/Lymphatic: The abdominal aorta and IVC are grossly unremarkable on this noncontrast CT. No portal venous gas. Top-normal left retroperitoneal lymph nodes. Reproductive: Vascular calcification of the peripheral uterus noted. No pelvic mass. Other: Diffuse subcutaneous edema and anasarca. Musculoskeletal: There is degenerative changes of the spine. Grade 1 L4-L5 anterolisthesis. There is compression fracture of the superior endplate of the L2, age indeterminate but new since the prior CT of 2014. Compression fracture of the inferior endplate of H20 and N47. The T12 compression fracture is new since the prior CT. There is no retropulsed fragment. IMPRESSION: 1. Bilateral ureteral stents in place as described. No hydronephrosis. 2. Probable small bilateral renal calculi versus residual contrast from recent study. 3. Small ascites, diffuse edema, and anasarca of indeterminate etiology. Clinical correlation is recommended. 4. Thickened stomach and colon, likely related to ascites. Electronically Signed   By: Anner Crete M.D.   On: 11/08/2017 21:41   Ct Head Code Stroke Wo Contrast  Result Date: 11/14/2017 CLINICAL DATA:  Code stroke. LEFT-sided weakness and facial droop with drooling. EXAM: CT HEAD WITHOUT CONTRAST TECHNIQUE: Contiguous axial images were obtained from the base of the skull through the vertex without intravenous contrast. COMPARISON:  09/11/2013. FINDINGS: Brain: No evidence for acute infarction, hemorrhage, mass lesion, hydrocephalus, or extra-axial fluid. Generalized atrophy, premature for age. Hypoattenuation of white matter consistent with small vessel disease. Vascular: Calcification of the cavernous internal carotid arteries consistent with cerebrovascular atherosclerotic disease. No signs of intracranial large vessel occlusion. Skull: Calvarium intact.  Sinuses/Orbits: No sinus disease.  Negative orbits. Other: None. ASPECTS [facility] Stroke Program Early CT Score) - Ganglionic level infarction (caudate, lentiform nuclei, internal capsule, insula, M1-M3 cortex): 7 - Supraganglionic infarction (M4-M6 cortex): 3 Total score (0-10 with 10 being normal): 10 IMPRESSION: 1. Atrophy and small vessel disease, mildly progressed from 2015. No acute intracranial findings. No signs of large vessel occlusion. 2. ASPECTS is 10. 3. A text page was placed to the ordering provider at 11:17 a.m. Electronically Signed   By: Staci Righter M.D.   On: 11/14/2017 11:23       Subjective: Patient is feeling well, no nausea or vomiting, no dyspnea or chest pain, out of bed to chair, continue to be very weak.   Discharge Exam: Vitals:   11/29/17 0304 11/29/17 0746  BP: (!) 141/71 131/67  Pulse: 87 87  Resp: 16 18  Temp: 100.1 F (37.8 C) 98.1 F (36.7 C)  SpO2: 99% 100%   Vitals:   11/28/17 1950 11/28/17 2307 11/29/17 0304 11/29/17 0746  BP: 134/60 (!) 145/66 (!) 141/71 131/67  Pulse: (!) 101 92 87 87  Resp: _0 Temp: 100 F (37.8 C) 99.1 F (37.3 C) 100.1 F (37.8 C) 98.1 F (36.7 C)  TempSrc: Oral Oral Oral Oral  SpO2: 99% 99% 99% 100%  Weight:      Height:        General: deconditioned  Neurology: Awake and alert, non focal  E ENT: no pallor, no icterus, oral mucosa moist Cardiovascular: No JVD. S1-S2 present, rhythmic, no gallops, rubs, or murmurs. No lower extremity edema. Pulmonary: vesicular breath sounds bilaterally, adequate air movement, no wheezing, rhonchi or rales. Gastrointestinal. Abdomen with no organomegaly, non tender, no rebound or guarding Skin. No rashes Musculoskeletal: no joint deformities Right IJ catheter for HD.   The results of significant diagnostics from this hospitalization (including imaging, microbiology, ancillary and laboratory) are listed below for reference.  Microbiology: No results found for  this or any previous visit (from the past 240 hour(s)).   Labs: BNP (last 3 results) No results for input(s): BNP in the last 8760 hours. Basic Metabolic Panel: Recent Labs  Lab 11/24/17 0711 11/26/17 0252 11/27/17 0401 11/29/17 0931  NA 133* 134* 133* 133*  K 4.1 4.0 3.7 4.9  CL 104 98 99 97*  CO2 _0 GLUCOSE 215* 272* 190* 179*  BUN 16 21 30* 21  CREATININE 3.69* 3.83* 4.68* 3.57*  CALCIUM 7.6* 7.9* 8.1* 8.2*  PHOS  --   --   --  2.5   Liver Function Tests: Recent Labs  Lab 11/29/17 0931  ALBUMIN 1.8*   No results for input(s): LIPASE, AMYLASE in the last 168 hours. No results for input(s): AMMONIA in the last 168 hours. CBC: Recent Labs  Lab 11/24/17 0711 11/27/17 0401  WBC 12.1* 8.9  NEUTROABS  --  5.6  HGB 7.8* 8.4*  HCT 26.0* 27.6*  MCV 93.2 93.2  PLT 264 299   Cardiac Enzymes: No results for input(s): CKTOTAL, CKMB, CKMBINDEX, TROPONINI in the last 168 hours. BNP: Invalid input(s): POCBNP CBG: Recent Labs  Lab 11/28/17 2116 11/29/17 0647 11/29/17 0736 11/29/17 0851 11/29/17 1124  GLUCAP 177* 49* 54* 123* 173*   D-Dimer No results for input(s): DDIMER in the last 72 hours. Hgb A1c No results for input(s): HGBA1C in the last 72 hours. Lipid Profile No results for input(s): CHOL, HDL, LDLCALC, TRIG, CHOLHDL, LDLDIRECT in the last 72 hours. Thyroid function studies No results for input(s): TSH, T4TOTAL, T3FREE, THYROIDAB in the last 72 hours.  Invalid input(s): FREET3 Anemia work up No results for input(s): VITAMINB12, FOLATE, FERRITIN, TIBC, IRON, RETICCTPCT in the last 72 hours. Urinalysis    Component Value Date/Time   COLORURINE YELLOW 11/08/2017 2007   APPEARANCEUR TURBID (A) 11/08/2017 2007   LABSPEC 1.009 11/08/2017 2007   PHURINE 6.0 11/08/2017 2007   GLUCOSEU NEGATIVE 11/08/2017 2007   HGBUR MODERATE (A) 11/08/2017 2007   BILIRUBINUR NEGATIVE 11/08/2017 2007   KETONESUR NEGATIVE 11/08/2017 2007   PROTEINUR 100 (A)  11/08/2017 2007   UROBILINOGEN 0.2 07/25/2014 2309   NITRITE NEGATIVE 11/08/2017 2007   LEUKOCYTESUR MODERATE (A) 11/08/2017 2007   Sepsis Labs Invalid input(s): PROCALCITONIN,  WBC,  LACTICIDVEN Microbiology No results found for this or any previous visit (from the past 240 hour(s)).   Time coordinating discharge: 45 minutes  SIGNED:   Tawni Millers, MD  [facility] 11/29/2017, 12:43 PM Pager [phone]  If 7PM-7AM, please contact night-coverage [email or web address] Password [email or web address]

## 2017-11-29 NOTE — Progress Notes (Signed)
Physical Therapy Treatment Patient Details Name: Erica [name] MRN: [id number] DOB: 01-Apr-1955 Today's Date: 11/29/2017    History of Present Illness Pt is a 62 y/o female presents with Lft facial, Lft UE and Lft LE twitching at dialysis and admitted with a breakthrough seizure. 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 s/pRIJ HD cath placed 10/7. PMH includes DM with history of DKA, HTN, HLD, CKD, cardiac murmur, chronic diarrhea, nephrolithiasis with history of pyelonephritis, gastroparesis with GERD and a history of recurrent alcohol-associated pancreatitis, and a history of major depression disorder.     PT Comments    Patient progressing some with mobility using less visual feedback as increased gait distance.  Fatigues with mobility due to effort, but feel getting increased core strength using walker and trying to perform mobility skills with less assist.  She remains appropriate for CIR level rehab for interdisciplinary skilled therapies to maximize mobility and independence prior to d/c home.   Follow Up Recommendations  CIR;Supervision for mobility/OOB     Equipment Recommendations  None recommended by PT    Recommendations for Other Services       Precautions / Restrictions Precautions Precautions: Fall Precaution Comments: seizures    Mobility  Bed Mobility Overal bed mobility: Needs Assistance Bed Mobility: Supine to Sit     Supine to sit: Supervision     General bed mobility comments: use of rail, increased time  Transfers Overall transfer level: Needs assistance Equipment used: Rolling walker (2 wheeled) Transfers: Sit to/from Stand Sit to Stand: Min assist         General transfer comment: assist for balance, safety, cues throughout for hand placement  Ambulation/Gait Ambulation/Gait assistance: Mod assist Gait Distance (Feet): 65 Feet Assistive device: Rolling walker (2 wheeled) Gait Pattern/deviations:  Step-to pattern;Step-through pattern;Decreased stride length;Wide base of support;Ataxic     General Gait Details: slow pace, ataxic on L, increased time and occasional visual reliance for L LE progression, able to advance over time with less visual feedback   Stairs             Wheelchair Mobility    Modified Rankin (Stroke Patients Only) Modified Rankin (Stroke Patients Only) Pre-Morbid Rankin Score: Moderate disability Modified Rankin: Moderately severe disability     Balance Overall balance assessment: Needs assistance Sitting-balance support: Feet supported Sitting balance-Leahy Scale: Good Sitting balance - Comments: sitting EOB end of session with bed alarm and feet supported, able to reach for Nepro on her table no concerns, RN aware   Standing balance support: Bilateral upper extremity supported Standing balance-Leahy Scale: Poor Standing balance comment: Requires BUE support for standing balance.                High Level Balance Comments: standing at sink with UE support forward and side step taps alternating feet for coordination more than balance            Cognition Arousal/Alertness: Awake/alert Behavior During Therapy: WFL for tasks assessed/performed Overall Cognitive Status: Within Functional Limits for tasks assessed                                 General Comments: alert, oriented x 4, eager for CIR and remembers son's visit yesterday      Exercises      General Comments        Pertinent Vitals/Pain Pain Assessment: No/denies pain    Home Living  Prior Function            PT Goals (current goals can now be found in the care plan section) Progress towards PT goals: Progressing toward goals    Frequency    Min 4X/week      PT Plan Current plan remains appropriate    Co-evaluation              AM-PAC PT "6 Clicks" Daily Activity  Outcome Measure  Difficulty turning  over in bed (including adjusting bedclothes, sheets and blankets)?: Unable Difficulty moving from lying on back to sitting on the side of the bed? : Unable Difficulty sitting down on and standing up from a chair with arms (e.g., wheelchair, bedside commode, etc,.)?: Unable Help needed moving to and from a bed to chair (including a wheelchair)?: A Little Help needed walking in hospital room?: A Lot Help needed climbing 3-5 steps with a railing? : A Lot 6 Click Score: 10    End of Session Equipment Utilized During Treatment: Gait belt Activity Tolerance: Patient tolerated treatment well Patient left: in bed;with call bell/phone within reach;with bed alarm set   PT Visit Diagnosis: Ataxic gait (R26.0);Other abnormalities of gait and mobility (R26.89);Hemiplegia and hemiparesis Hemiplegia - Right/Left: Left Hemiplegia - dominant/non-dominant: Non-dominant Hemiplegia - caused by: Cerebral infarction     Time: 1233-1300 PT Time Calculation (min) (ACUTE ONLY): 27 min  Charges:  $Gait Training: 8-22 mins $Neuromuscular Re-education: 8-22 mins                     Magda Kiel, [place] [facility] [phone] 11/29/2017    Reginia Naas 11/29/2017, 1:41 PM

## 2017-11-30 ENCOUNTER — Inpatient Hospital Stay (HOSPITAL_COMMUNITY): Payer: Medicaid Other | Admitting: Occupational Therapy

## 2017-11-30 ENCOUNTER — Inpatient Hospital Stay (HOSPITAL_COMMUNITY): Payer: Medicaid Other

## 2017-11-30 ENCOUNTER — Inpatient Hospital Stay (HOSPITAL_COMMUNITY): Payer: Medicaid Other | Admitting: Physical Therapy

## 2017-11-30 DIAGNOSIS — E1165 Type 2 diabetes mellitus with hyperglycemia: Secondary | ICD-10-CM

## 2017-11-30 DIAGNOSIS — Z992 Dependence on renal dialysis: Secondary | ICD-10-CM

## 2017-11-30 DIAGNOSIS — N390 Urinary tract infection, site not specified: Secondary | ICD-10-CM

## 2017-11-30 DIAGNOSIS — A499 Bacterial infection, unspecified: Secondary | ICD-10-CM

## 2017-11-30 DIAGNOSIS — N186 End stage renal disease: Secondary | ICD-10-CM

## 2017-11-30 DIAGNOSIS — I639 Cerebral infarction, unspecified: Secondary | ICD-10-CM

## 2017-11-30 LAB — URINALYSIS, ROUTINE W REFLEX MICROSCOPIC
Bilirubin Urine: NEGATIVE
Glucose, UA: 50 mg/dL — AB
Ketones, ur: NEGATIVE mg/dL
Nitrite: NEGATIVE
PH: 8 (ref 5.0–8.0)
Protein, ur: 100 mg/dL — AB
RBC / HPF: >50 RBC/hpf — ABNORMAL HIGH (ref 0–5)
SPECIFIC GRAVITY, URINE: 1.008 (ref 1.005–1.030)
WBC, UA: >50 WBC/hpf — ABNORMAL HIGH (ref 0–5)

## 2017-11-30 LAB — RENAL FUNCTION PANEL
ALBUMIN: 2 g/dL — AB (ref 3.5–5.0)
ANION GAP: 9 (ref 5–15)
BUN: 36 mg/dL — ABNORMAL HIGH (ref 8–23)
CALCIUM: 8.4 mg/dL — AB (ref 8.9–10.3)
CO2: 25 mmol/L (ref 22–32)
Chloride: 98 mmol/L (ref 98–111)
Creatinine, Ser: 4.3 mg/dL — ABNORMAL HIGH (ref 0.44–1.00)
GFR calc Af Amer: 12 mL/min — ABNORMAL LOW (ref >60)
GFR, EST NON AFRICAN AMERICAN: 10 mL/min — AB (ref >60)
Glucose, Bld: 302 mg/dL — ABNORMAL HIGH (ref 70–99)
PHOSPHORUS: 3 mg/dL (ref 2.5–4.6)
Potassium: 4.5 mmol/L (ref 3.5–5.1)
SODIUM: 132 mmol/L — AB (ref 135–145)

## 2017-11-30 LAB — GLUCOSE, CAPILLARY
GLUCOSE-CAPILLARY: 102 mg/dL — AB (ref 70–99)
Glucose-Capillary: 275 mg/dL — ABNORMAL HIGH (ref 70–99)
Glucose-Capillary: 282 mg/dL — ABNORMAL HIGH (ref 70–99)
Glucose-Capillary: 155 mg/dL — ABNORMAL HIGH (ref 70–99)

## 2017-11-30 MED ORDER — PHENYTOIN 125 MG/5ML PO SUSP
125.0000 mg | Freq: Two times a day (BID) | ORAL | Status: DC
  Administered 2017-11-30 – 2017-12-03 (×6): 125 mg via ORAL
  Filled 2017-11-30 (×6): qty 8

## 2017-11-30 MED ORDER — CEPHALEXIN 250 MG PO CAPS
250.0000 mg | ORAL_CAPSULE | Freq: Two times a day (BID) | ORAL | Status: AC
  Administered 2017-11-30 – 2017-12-01 (×3): 250 mg via ORAL
  Filled 2017-11-30 (×4): qty 1

## 2017-11-30 NOTE — Evaluation (Signed)
Occupational Therapy Assessment and Plan  Patient Details  Name: SHELLE GALDAMEZ MRN: [id number] Date of Birth: 23-May-1955  OT Diagnosis: cognitive deficits, hemiplegia affecting dominant side and muscle weakness (generalized) Rehab Potential: Rehab Potential (ACUTE ONLY): Good ELOS: 14-16 days   Today's Date: 11/30/2017 OT Individual Time: 0905-1005 OT Individual Time Calculation (min): 60 min     Problem List:  Patient Active Problem List   Diagnosis Date Noted  . Stroke ([place]) 11/29/2017  . Seizures ([place])   . Hydronephrosis   . Recurrent UTI   . Diabetes mellitus type 2 in nonobese (HCC)   . ESRD (end stage renal disease) ([place])   . Anemia of chronic disease   . Chronic diastolic congestive heart failure ([place])   . Essential hypertension   . AKI (acute kidney injury) ([place])   . PICC (peripherally inserted central catheter) in place   . Acute pyelonephritis 11/12/2017  . Acute renal failure superimposed on chronic kidney disease ([place]) 11/10/2017  . Uncontrolled type 2 diabetes mellitus with hyperglycemia, with long-term current use of insulin ([place]) 11/10/2017  . Renal failure 11/08/2017  . Seizure disorder ([place]) 11/08/2017  . Orthostatic hypotension 07/25/2014  . Moderate protein malnutrition ([place]) 09/15/2013  . Aspiration pneumonia ([place]) 09/15/2013  . Acute respiratory failure requiring reintubation ([place]) 09/11/2013  . probable Seizures due to metabolic disorder 11/91/4782  . Lactic acidosis 03/19/2013  . Abdominal pain 03/19/2013  . Rotavirus infection 10/29/2012  . Type II or unspecified type diabetes mellitus without mention of complication, uncontrolled 10/29/2012  . Protein-calorie malnutrition, severe ([place]) 10/27/2012  . NSTEMI (non-ST elevated myocardial infarction) ([place]) 10/26/2012  . Fever, unspecified 10/26/2012  . Hypotension 10/25/2012  . Alcohol withdrawal ([place]) 10/25/2012  . Metabolic acidosis 95/62/1308  . Chronic diarrhea 10/25/2012  . Tobacco abuse  10/25/2012  . DKA (diabetic ketoacidoses) ([place]) 09/09/2012  . Dehydration 09/09/2012  . DKA, type 2 ([place]) 05/20/2012  . Abnormal LFTs 05/20/2012  . Heart murmur, systolic 65/78/4696  . Diabetes mellitus ([place]) 07/08/2011  . Hypoglycemia 07/08/2011  . Metabolic encephalopathy 29/52/8413  . Alcohol intoxication ([place]) 07/08/2011  . Alcohol abuse 07/08/2011  . Hypokalemia 07/08/2011  . Nausea & vomiting 07/08/2011  . Abdominal pain 07/08/2011  . H/O chronic pancreatitis 07/08/2011    Past Medical History:  Past Medical History:  Diagnosis Date  . Alcohol-induced pancreatitis   . Chronic diarrhea   . Depression   . Diabetes mellitus    fasting blood sugar 110-120s  . Diastolic CHF ([place])   . DKA (diabetic ketoacidoses) ([place])   . Gastroparesis   . GERD (gastroesophageal reflux disease)   . Heart murmur   . History of kidney stones   . Hyperlipidemia   . Hypertension   . Hypokalemia   . Muscle spasm   . Neuropathic pain   . Neuropathy    Hx: of  . Pyelonephritis   . Recurrent pancreatitis   . Seizures ([place])   . Vitamin B12 deficiency   . Vitamin D deficiency    Past Surgical History:  Past Surgical History:  Procedure Laterality Date  . CATARACT EXTRACTION W/PHACO Right 03/14/2015   Procedure: CATARACT EXTRACTION PHACO AND INTRAOCULAR LENS PLACEMENT (IOC);  Surgeon: Baruch Goldmann, MD;  Location: [facility];  Service: Ophthalmology;  Laterality: Right;  CDE:11.13  . CATARACT EXTRACTION W/PHACO Left 04/11/2015   Procedure: CATARACT EXTRACTION PHACO AND INTRAOCULAR LENS PLACEMENT LEFT EYE CDE=9.68;  Surgeon: Baruch Goldmann, MD;  Location: [facility];  Service: Ophthalmology;  Laterality: Left;  .  COLONOSCOPY  02/24/2010  . cystoscopy with ureteral stent  Bilateral 10/07/2017   At [facility] CV LINE RIGHT  11/17/2017  . IR FLUORO GUIDE CV LINE RIGHT  11/22/2017  . IR US GUIDE VASC ACCESS RIGHT  11/17/2017  . MULTIPLE EXTRACTIONS WITH ALVEOLOPLASTY N/A 06/13/2012    Procedure: MULTIPLE EXTRACION WITH ALVEOLOPLASTY EXTRACT: 18, 19, 20, 21, 22, 24, 25, 27, 28, 29, 30, 31;  Surgeon: Gae Bon, DDS;  Location: [facility];  Service: Oral Surgery;  Laterality: N/A;  . TUBAL LIGATION    . URETERAL STENT PLACEMENT  09/2017    Assessment & Plan Clinical Impression: Patient is a 62 y.o. year old female with history of T2DM with gastropathy and neuropathy, hypertension, seizure disorder, bilateral hydronephrosis status post stent who was admitted to [facility] on 11/08/2017 for weakness acute on chronic renal failure with uremia, encephalopathy, poor p.o. intake with weakness, leukocytosis and question of UTI.  History taken from chart review. She was transferred to [facility] for treatment and started on hemodialysis.   Dr. 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  As patient with recurrent E. Coli, he recommended keeping patient on antibiotic coverage until stents removed.  She was treated with 2-week course of IV ceftriaxone.  While in dialysis on   09/29, she developed sided weakness left facial twitching and increase in lethargy.  She was loaded with Keppra and Topamax added due to reports of left eye twitching for 2 days and concern for partial status epilepticus.  MRI of brain reviewed, showing multifocal infarct. Per report, scattered punctate foci of acute infarct in the deep white matter of both frontal and parietal vertices consistent with watershed infarct.  MRA brain limited by motion but no emergent large vessel occlusion or stenosis noted.  2D echo showed normal LV size with moderate asymmetric basal septal hypertrophy --due to HTN versus hypertrophic cardiomyopathy and EF 60 to 65%.  Dr. Leonel Ramsay felt that  multifocal infarct due to hypo-perfusion and no further modification of medical regiment recommended.  She developed fevers  on 10/1 and hemodialysis catheter removed. Blood culture negative defervesced.  Right hemodialysis catheter placed on 10/07 by Dr. Barbie Banner and hemodialysis ongoing on Tuesday Thursday Saturday (accepted at [facility] TTS-12:20 PM slot).  Treated with 1 unit PRBC.  Therapy ongoing and patient limited by decreased processing, cognitive deficits with delayed processing, ataxia and LLE weakness.   CIR recommended for follow-up therapy.   Patient transferred to CIR on 11/29/2017 .    Patient currently requires min with basic self-care skills secondary to muscle weakness, decreased cardiorespiratoy endurance, impaired timing and sequencing, unbalanced muscle activation and decreased coordination, decreased attention, decreased awareness, decreased problem solving, decreased safety awareness and decreased memory and decreased standing balance, decreased postural control, hemiplegia and decreased balance strategies.  Prior to hospitalization, patient could complete ADLs with independent .  Patient will benefit from skilled intervention to decrease level of assist with basic self-care skills prior to discharge home with care partner.  Anticipate patient will require 24 hour supervision and follow up home health.  OT - End of Session Activity Tolerance: Tolerates 30+ min activity with multiple rests Endurance Deficit: Yes Endurance Deficit Description: decreased endurance OT Assessment Rehab Potential (ACUTE ONLY): Good OT Barriers to Discharge: Decreased caregiver support OT Barriers to Discharge Comments: pt's son works  OT Patient demonstrates  impairments in the following area(s): Balance;Cognition;Endurance;Motor;Pain;Safety OT Basic ADL's Functional Problem(s): Grooming;Bathing;Dressing;Toileting OT Advanced ADL's Functional Problem(s): Simple Meal Preparation OT Transfers Functional Problem(s): Toilet;Tub/Shower OT Additional Impairment(s): Fuctional Use of Upper Extremity OT Plan OT  Intensity: Minimum of 1-2 x/day, 45 to 90 minutes OT Frequency: 5 out of 7 days OT Duration/Estimated Length of Stay: 14-16 days OT Treatment/Interventions: Balance/vestibular training;Cognitive remediation/compensation;Discharge planning;Disease mangement/prevention;DME/adaptive equipment instruction;Functional mobility training;Neuromuscular re-education;Pain management;Patient/family education;Psychosocial support;Self Care/advanced ADL retraining;Therapeutic Activities;Therapeutic Exercise;UE/LE Strength taining/ROM;UE/LE Coordination activities OT Self Feeding Anticipated Outcome(s): Mod I OT Basic Self-Care Anticipated Outcome(s): Supervision OT Toileting Anticipated Outcome(s): Supervision OT Bathroom Transfers Anticipated Outcome(s): Supervision OT Recommendation Recommendations for Other Services: Neuropsych consult;Therapeutic Recreation consult Therapeutic Recreation Interventions: Kitchen group Patient destination: Home Follow Up Recommendations: Home health OT;24 hour supervision/assistance Equipment Recommended: Tub/shower bench   Skilled Therapeutic Intervention OT eval completed with discussion of rehab process, OT purpose, POC, ELOS, and goals.  ADL assessment completed with bathing and dressing at sit > stand level at sink due to HD port.  Pt received upright in bed having just received breakfast tray, therefore pt completing self-feeding prior to bathing and dressing.  Completed bed mobility with min guard and sit > stand with min assist.  Stand pivot transfer with UE support on w/c arm rest with min assist.  Pt appears quite apprehensive throughout tasks, often looking to therapist for feedback.  Completed bathing at sit > stand level with min assist for standing balance.  Pt donned hospital scrubs due to not having personal belongings yet (son to bring in clothing) with min assist when standing to pull pants over hips.  Pt unsteady in standing without UE support.  Pt required  frequent cues and encouragement to engage in self-care tasks.    OT Evaluation Precautions/Restrictions  Precautions Precautions: Fall Precaution Comments: seizures Restrictions Weight Bearing Restrictions: No Pain Pain Assessment Pain Scale: 0-10 Pain Score: 0-No pain Home Living/Prior Functioning Home Living Family/patient expects to be discharged to:: Private residence Living Arrangements: Children Available Help at Discharge: Other (Comment)(per report "son thinks an aunt can assist when he works") Type of Home: Apartment Home Access: Level entry Home Layout: One level Bathroom Shower/Tub: Multimedia programmer: Programmer, systems: Yes  Lives With: Son IADL History Homemaking Responsibilities: Yes Meal Prep Responsibility: Therapist, occupational Responsibility: Primary Cleaning Responsibility: Primary Prior Function Level of Independence: Independent with basic ADLs, Independent with homemaking with ambulation, Independent with gait  Able to Take Stairs?: Yes Driving: Yes ADL ADL Eating: Set up Grooming: Setup, Minimal assistance Where Assessed-Grooming: Standing at sink Upper Body Bathing: Minimal cueing, Minimal assistance Where Assessed-Upper Body Bathing: Sitting at sink Lower Body Bathing: Minimal cueing, Minimal assistance Where Assessed-Lower Body Bathing: (sit > stand at sink) Upper Body Dressing: Setup Where Assessed-Upper Body Dressing: Sitting at sink Lower Body Dressing: Minimal assistance Where Assessed-Lower Body Dressing: (sit > stand at sink) Toilet Transfer: Minimal assistance Toilet Transfer Method: Stand Ecologist: Bedside commode Vision Baseline Vision/History: Wears glasses Wears Glasses: Reading only Patient Visual Report: Diplopia;Other (comment)(pt reports it feels like my Rt eye moves when trying to focus) Vision Assessment?: Yes Eye Alignment: Within Functional Limits Ocular Range of Motion:  Within Functional Limits Alignment/Gaze Preference: Head tilt(slight head tilt to Rt) Tracking/Visual Pursuits: Able to track stimulus in all quads without difficulty Saccades: Within functional limits Additional Comments: somewhat slow throughout, but overall WFL Cognition Overall Cognitive Status: Within Functional Limits for tasks assessed Arousal/Alertness: Awake/alert Orientation Level: Person;Place;Situation Person: Oriented Place: Oriented  Situation: Oriented Year: 2019 Month: October Day of Week: Correct Immediate Memory Recall: Blue;Sock;Bed Memory Recall: Sock;Blue;Bed Memory Recall Sock: Without Cue Memory Recall Blue: Without Cue Memory Recall Bed: Without Cue Attention: Sustained Sustained Attention: Appears intact Safety/Judgment: Impaired Sensation Sensation Light Touch: Appears Intact Proprioception: Appears Intact Coordination Gross Motor Movements are Fluid and Coordinated: No Fine Motor Movements are Fluid and Coordinated: No Finger Nose Finger Test: overall slowed Extremity/Trunk Assessment RUE Assessment RUE Assessment: Exceptions to [facility] Passive Range of Motion (PROM) Comments: WFL Active Range of Motion (AROM) Comments: shoulder flexion limited to grossly 130*  General Strength Comments: grossly 3+/5 shoulder, 4/5 distally RUE Body System: Neuro Brunstrum levels for arm and hand: Arm;Hand Brunstrum level for arm: Stage V Relative Independence from Synergy Brunstrum level for hand: Stage VI Isolated joint movements LUE Assessment LUE Assessment: Exceptions to [facility] Passive Range of Motion (PROM) Comments: WFL Active Range of Motion (AROM) Comments: shoulder flexion limited to grossly 130* General Strength Comments: strength 4/5 overall     Refer to Care Plan for Long Term Goals  Recommendations for other services: Neuropsych and Therapeutic Recreation  Kitchen group   Discharge Criteria: Patient will be discharged from OT if patient refuses  treatment 3 consecutive times without medical reason, if treatment goals not met, if there is a change in medical status, if patient makes no progress towards goals or if patient is discharged from hospital.  The above assessment, treatment plan, treatment alternatives and goals were discussed and mutually agreed upon: by patient  Simonne Come 11/30/2017, 12:21 PM

## 2017-11-30 NOTE — Progress Notes (Signed)
Patient with multiple incontinent loose, foul smelling stool throughout the day.  Patient unable to sense when she needs to go at times.  Does not complain of any abdominal discomfort.  Denies nausea.  She states she had diarrhea when she first was admitted but it had resolved, and is now starting again.  Patient has prn lomotil ordered, will consider giving dose if continues to have loose stools.  Brita Romp, RN

## 2017-11-30 NOTE — Evaluation (Signed)
Physical Therapy Assessment and Plan  Patient Details  Name: Erica [name] MRN: [id number] Date of Birth: 16-Apr-1955  PT Diagnosis: Abnormality of gait, Coordination disorder, Difficulty walking, Hemiparesis non-dominant, Impaired cognition and Muscle weakness Rehab Potential: Good ELOS: 1.5-2 weeks   Today's Date: 11/30/2017 PT Individual Time: 1005-1100 PT Individual Time Calculation (min): 55 min    Problem List:  Patient Active Problem List   Diagnosis Date Noted  . Stroke ([place]) 11/29/2017  . Seizures ([place])   . Hydronephrosis   . Recurrent UTI   . Diabetes mellitus type 2 in nonobese (HCC)   . ESRD (end stage renal disease) ([place])   . Anemia of chronic disease   . Chronic diastolic congestive heart failure ([place])   . Essential hypertension   . AKI (acute kidney injury) ([place])   . PICC (peripherally inserted central catheter) in place   . Acute pyelonephritis 11/12/2017  . Acute renal failure superimposed on chronic kidney disease ([place]) 11/10/2017  . Uncontrolled type 2 diabetes mellitus with hyperglycemia, with long-term current use of insulin ([place]) 11/10/2017  . Renal failure 11/08/2017  . Seizure disorder ([place]) 11/08/2017  . Orthostatic hypotension 07/25/2014  . Moderate protein malnutrition ([place]) 09/15/2013  . Aspiration pneumonia ([place]) 09/15/2013  . Acute respiratory failure requiring reintubation ([place]) 09/11/2013  . probable Seizures due to metabolic disorder 40/98/1191  . Lactic acidosis 03/19/2013  . Abdominal pain 03/19/2013  . Rotavirus infection 10/29/2012  . Type II or unspecified type diabetes mellitus without mention of complication, uncontrolled 10/29/2012  . Protein-calorie malnutrition, severe ([facility]) 10/27/2012  . NSTEMI (non-ST elevated myocardial infarction) ([place]) 10/26/2012  . Fever, unspecified 10/26/2012  . Hypotension 10/25/2012  . Alcohol withdrawal ([place]) 10/25/2012  . Metabolic acidosis 47/82/9562  . Chronic diarrhea 10/25/2012  . Tobacco  abuse 10/25/2012  . DKA (diabetic ketoacidoses) ([place]) 09/09/2012  . Dehydration 09/09/2012  . DKA, type 2 ([place]) 05/20/2012  . Abnormal LFTs 05/20/2012  . Heart murmur, systolic 13/09/6576  . Diabetes mellitus ([place]) 07/08/2011  . Hypoglycemia 07/08/2011  . Metabolic encephalopathy 46/96/2952  . Alcohol intoxication ([place]) 07/08/2011  . Alcohol abuse 07/08/2011  . Hypokalemia 07/08/2011  . Nausea & vomiting 07/08/2011  . Abdominal pain 07/08/2011  . H/O chronic pancreatitis 07/08/2011    Past Medical History:  Past Medical History:  Diagnosis Date  . Alcohol-induced pancreatitis   . Chronic diarrhea   . Depression   . Diabetes mellitus    fasting blood sugar 110-120s  . Diastolic CHF ([place])   . DKA (diabetic ketoacidoses) ([place])   . Gastroparesis   . GERD (gastroesophageal reflux disease)   . Heart murmur   . History of kidney stones   . Hyperlipidemia   . Hypertension   . Hypokalemia   . Muscle spasm   . Neuropathic pain   . Neuropathy    Hx: of  . Pyelonephritis   . Recurrent pancreatitis   . Seizures ([place])   . Vitamin B12 deficiency   . Vitamin D deficiency    Past Surgical History:  Past Surgical History:  Procedure Laterality Date  . CATARACT EXTRACTION W/PHACO Right 03/14/2015   Procedure: CATARACT EXTRACTION PHACO AND INTRAOCULAR LENS PLACEMENT (IOC);  Surgeon: Baruch Goldmann, MD;  Location: [facility];  Service: Ophthalmology;  Laterality: Right;  CDE:11.13  . CATARACT EXTRACTION W/PHACO Left 04/11/2015   Procedure: CATARACT EXTRACTION PHACO AND INTRAOCULAR LENS PLACEMENT LEFT EYE CDE=9.68;  Surgeon: Baruch Goldmann, MD;  Location: [facility];  Service: Ophthalmology;  Laterality: Left;  . COLONOSCOPY  02/24/2010  . cystoscopy with ureteral stent  Bilateral 10/07/2017   At [place] CV LINE RIGHT  11/17/2017  . IR FLUORO GUIDE CV LINE RIGHT  11/22/2017  . IR US GUIDE VASC ACCESS RIGHT  11/17/2017  . MULTIPLE EXTRACTIONS WITH ALVEOLOPLASTY N/A  06/13/2012   Procedure: MULTIPLE EXTRACION WITH ALVEOLOPLASTY EXTRACT: 18, 19, 20, 21, 22, 24, 25, 27, 28, 29, 30, 31;  Surgeon: Gae Bon, DDS;  Location: [facility];  Service: Oral Surgery;  Laterality: N/A;  . TUBAL LIGATION    . URETERAL STENT PLACEMENT  09/2017    Assessment & Plan Clinical Impression: Patient is a 62 y.o. year old female with history of T2DM with gastropathy and neuropathy, hypertension, seizure disorder, bilateral hydronephrosis status post stent who was admitted to [facility] on 11/08/2017 for weakness acute on chronic renal failure with uremia, encephalopathy, poor p.o. intake with weakness, leukocytosis and question of UTI.  History taken from chart review. She was transferred to [facility] for treatment and started on hemodialysis.   Dr. 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  As patient with recurrent E. Coli, he recommended keeping patient on antibiotic coverage until stents removed.  She was treated with 2-week course of IV ceftriaxone.  While in dialysis on   09/29, she developed sided weakness left facial twitching and increase in lethargy.  She was loaded with Keppra and Topamax added due to reports of left eye twitching for 2 days and concern for partial status epilepticus.  MRI of brain reviewed, showing multifocal infarct. Per report, scattered punctate foci of acute infarct in the deep white matter of both frontal and parietal vertices consistent with watershed infarct.  MRA brain limited by motion but no emergent large vessel occlusion or stenosis noted.  2D echo showed normal LV size with moderate asymmetric basal septal hypertrophy --due to HTN versus hypertrophic cardiomyopathy and EF 60 to 65%.  Dr. Leonel Ramsay felt that  multifocal infarct due to hypo-perfusion and no further modification of medical regiment recommended.  She  developed fevers on 10/1 and hemodialysis catheter removed. Blood culture negative defervesced.  Right hemodialysis catheter placed on 10/07 by Dr. Barbie Banner and hemodialysis ongoing on Tuesday Thursday Saturday (accepted at [facility] TTS-12:20 PM slot).  Treated with 1 unit PRBC.  Therapy ongoing and patient limited by decreased processing, cognitive deficits with delayed processing, ataxia and LLE weakness.   CIR recommended for follow-up therapy.  Patient transferred to CIR on 11/29/2017.   Patient currently requires min with mobility secondary to muscle weakness, decreased cardiorespiratoy endurance, decreased coordination, decreased awareness, decreased problem solving, decreased safety awareness, decreased memory and delayed processing, and decreased standing balance, decreased postural control, hemiplegia and decreased balance strategies.  Prior to hospitalization, patient was independent  with mobility and lived with Son in a [place] home.  Home access is  Level entry.  Patient will benefit from skilled PT intervention to maximize safe functional mobility, minimize fall risk and decrease caregiver burden for planned discharge home with 24 hour supervision.  Anticipate patient will benefit from follow up [place] at discharge.  PT - End of Session Activity Tolerance: Decreased this session;Tolerates 30+ min activity with multiple rests Endurance Deficit: Yes Endurance Deficit Description: decreased endurance PT Assessment Rehab Potential (ACUTE/IP ONLY): Good PT Barriers to Discharge: Decreased caregiver support;Lack of/limited family support;Behavior PT Barriers to Discharge Comments:  son works - not sure if pt has recommended 24 hr supervision at d/c, impaired cognition PT Patient demonstrates impairments in the following area(s): Balance;Behavior;Endurance;Motor;Safety PT Transfers Functional Problem(s): Bed Mobility;Bed to Chair;Car;Furniture;Floor PT Locomotion Functional Problem(s):  Ambulation;Stairs PT Plan PT Intensity: Minimum of 1-2 x/day ,45 to 90 minutes PT Frequency: 5 out of 7 days PT Duration Estimated Length of Stay: 1.5-2 weeks PT Treatment/Interventions: Ambulation/gait training;Cognitive remediation/compensation;Discharge planning;DME/adaptive equipment instruction;Functional mobility training;Pain management;Psychosocial support;Splinting/orthotics;Therapeutic Activities;UE/LE Strength taining/ROM;Visual/perceptual remediation/compensation;Wheelchair propulsion/positioning;UE/LE Coordination activities;Therapeutic Exercise;Stair training;Skin care/wound management;Neuromuscular re-education;Patient/family education;Functional electrical stimulation;Disease management/prevention;Community reintegration;Balance/vestibular training PT Transfers Anticipated Outcome(s): supervision with LRAD PT Locomotion Anticipated Outcome(s): supervision with LRAD PT Recommendation Recommendations for Other Services: Speech consult;Therapeutic Recreation consult Therapeutic Recreation Interventions: Stress management Follow Up Recommendations: Home health PT;24 hour supervision/assistance Patient destination: Home Equipment Recommended: To be determined  Skilled Therapeutic Intervention Patient received in handoff from OT & agreeable to tx. Educated pt on ELOS and daily CIR schedule. Pt ambulates with L HHA & min assist over even surface, mod assist over ramp & uneven surface, and negotiates 12 steps with B rails and min assist with cuing for compensatory pattern. Pt demonstrates decreased weight shifting L<>R and impaired dynamic balance during gait, as well as impaired coordination LLE. Pt completes bed mobility in apartment with CGA<>supervision and all transfers (sit<>stand, stand pivot, car from sedan simulated height) with min assist. Pt fatigues quickly 2/2 poor endurance. Encouraged retrieving object from floor but pt fearful and activity not attempted. At end of session pt  left in bed with alarm set & call bell in reach.   PT Evaluation Precautions/Restrictions Precautions Precautions: Fall Precaution Comments: seizures Restrictions Weight Bearing Restrictions: No  General Chart Reviewed: Yes Additional Pertinent History: DM2, HTN, seizures, B hydronephrosis, CRF, encephalopathy, diastolic CHF, depression, heart murmur, HLD, neuropathy Response to Previous Treatment: Patient with no complaints from previous session. Family/Caregiver Present: No    Pain Pain Assessment Pain Scale: 0-10 Pain Score: 0-No pain   Home Living/Prior Functioning Home Living Available Help at Discharge: Other (Comment)(pt reports her daughter can stay with her when her son works) Type of Home: Apartment Home Access: Level entry Home Layout: One level Bathroom Shower/Tub: Multimedia programmer: Standard Bathroom Accessibility: Yes  Lives With: Son Prior Function Level of Independence: Independent with basic ADLs;Independent with homemaking with ambulation;Independent with gait, intermittent use of RW   Able to Take Stairs?: Yes Driving: Yes but states she hasn't for a couple months  Vision/Perception  Pt reports blurry vision and intermittent use of glasses at baseline but reports blurriness is worse following event.   Cognition Overall Cognitive Status: Within Functional Limits for tasks assessed Arousal/Alertness: Awake/alert Orientation Level: Oriented X4 Safety/Judgment: Impaired   Sensation Sensation Light Touch: Appears Intact Proprioception: Appears Intact Coordination Gross Motor Movements are Fluid and Coordinated: No Fine Motor Movements are Fluid and Coordinated: No Finger Nose Finger Test: overall slowed   Motor  Motor Motor: Hemiplegia Motor - Skilled Clinical Observations: generalized deconditioning   Mobility Bed Mobility Bed Mobility: Rolling Right;Rolling Left;Supine to Sit;Sit to Supine Rolling Right: Supervision/verbal  cueing Rolling Left: Supervision/Verbal cueing Supine to Sit: Supervision/Verbal cueing Sit to Supine: Contact Guard/Touching assist Transfers Transfers: Sit to Stand;Stand to Sit;Stand Pivot Transfers Sit to Stand: Minimal Assistance - Patient > 75% Stand to Sit: Minimal Assistance - Patient > 75% Stand Pivot Transfers: Minimal Assistance - Patient > 75% Stand Pivot Transfer Details: Verbal cues for sequencing    Locomotion  Gait Ambulation: Yes Gait Assistance: Minimal  Assistance - Patient > 75% Gait Distance (Feet): 107 Feet Assistive device: 1 person hand held assist Gait Assistance Details: Manual facilitation for weight shifting Gait Gait: Yes Gait Pattern: Decreased weight shift to left;Decreased weight shift to right Gait velocity: decreased Stairs / Additional Locomotion Stairs: Yes Stair Management Technique: Two rails Number of Stairs: 12 Height of Stairs: (6" + 3") Ramp: Moderate Assistance - Patient 50 - 74%(ambulatory with L HHA) Wheelchair Mobility Wheelchair Mobility: No   Trunk/Postural Assessment  Postural Control Postural Control: Deficits on evaluation Righting Reactions: impaired Protective Responses: impaired   Balance Balance Balance Assessed: Yes Dynamic Standing Balance Dynamic Standing - Balance Support: Left upper extremity supported Dynamic Standing - Level of Assistance: 4: Min assist Dynamic Standing - Balance Activities: (during gait)   Extremity Assessment  Per OT report:  RUE Assessment RUE Assessment: Exceptions to [facility] Passive Range of Motion (PROM) Comments: WFL Active Range of Motion (AROM) Comments: shoulder flexion limited to grossly 130*  General Strength Comments: grossly 3+/5 shoulder, 4/5 distally RUE Body System: Neuro Brunstrum levels for arm and hand: Arm;Hand Brunstrum level for arm: Stage V Relative Independence from Synergy Brunstrum level for hand: Stage VI Isolated joint movements LUE Assessment LUE  Assessment: Exceptions to [facility] Passive Range of Motion (PROM) Comments: WFL Active Range of Motion (AROM) Comments: shoulder flexion limited to grossly 130* General Strength Comments: strength 4/5 overall   RLE Assessment RLE Assessment: Within Functional Limits Active Range of Motion (AROM) Comments: WFL General Strength Comments: 3+/5 knee extension, 3+/5 knee flexion, 3/5 hip flexion, 3/5 ankle dorsiflexion LLE Assessment Active Range of Motion (AROM) Comments: WFL General Strength Comments: 3+/5 knee extension, 3/5 knee flexion, hip flexion, ankle dorsiflexion    Refer to Care Plan for Long Term Goals  Recommendations for other services: Therapeutic Recreation  Stress management  Discharge Criteria: Patient will be discharged from PT if patient refuses treatment 3 consecutive times without medical reason, if treatment goals not met, if there is a change in medical status, if patient makes no progress towards goals or if patient is discharged from hospital.  The above assessment, treatment plan, treatment alternatives and goals were discussed and mutually agreed upon: by patient  [name] 11/30/2017, 12:56 PM

## 2017-11-30 NOTE — Progress Notes (Signed)
Subjective: Interval History: has complaints tired after w/o.  Objective: Vital signs in last 24 hours: Temp:  [98.6 F (37 C)-99.4 F (37.4 C)] 99.1 F (37.3 C) (10/15 0536) Pulse Rate:  [91-105] 105 (10/15 0536) Resp:  [16-18] 17 (10/15 0536) BP: (149-165)/(72-82) 154/75 (10/15 0536) SpO2:  [95 %-100 %] 95 % (10/15 0536) Weight:  [48.1 kg-50.4 kg] 48.1 kg (10/15 0536) Weight change:   Intake/Output from previous day: 10/14 0701 - 10/15 0700 In: 120 [P.O.:120] Out: -  Intake/Output this shift: Total I/O In: 240 [P.O.:240] Out: -   General appearance: alert, cooperative and no distress Resp: clear to auscultation bilaterally Cardio: S1, S2 normal and systolic murmur: systolic ejection 2/6, crescendo and decrescendo at 2nd left intercostal space GI: soft, non-tender; bowel sounds normal; no masses,  no organomegaly Extremities: RIJ cath  Lab Results: No results for input(s): WBC, HGB, HCT, PLT in the last 72 hours. BMET:  Recent Labs    11/29/17 0931  NA 133*  K 4.9  CL 97*  CO2 26  GLUCOSE 179*  BUN 21  CREATININE 3.57*  CALCIUM 8.2*   No results for input(s): PTH in the last 72 hours. Iron Studies: No results for input(s): IRON, TIBC, TRANSFERRIN, FERRITIN in the last 72 hours.  Studies/Results: No results found.  I have reviewed the patient's current medications.  Assessment/Plan: 1 AKI ? Improving , chem orders dropped on transfer and we were not informed of transfer, falls off list..  Clinically stable 2 Anemia stable, on esa 3 E coli UTI off AB 4 Sz on meds.  5 DM controlled 6 Debill P check chem , follow urine   LOS: 1 day   Erica [name] 11/30/2017,11:26 AM

## 2017-11-30 NOTE — Progress Notes (Signed)
[facility] PHYSICAL MEDICINE & REHABILITATION PROGRESS NOTE   Subjective/Complaints: Pt asleep in bed. No problems overnight  ROS: Patient denies fever, rash, sore throat, blurred vision, nausea, vomiting, diarrhea, cough, shortness of breath or chest pain, joint or back pain, headache, or mood change.    Objective:   No results found. No results for input(s): WBC, HGB, HCT, PLT in the last 72 hours. Recent Labs    11/29/17 0931 11/30/17 1209  NA 133* 132*  K 4.9 4.5  CL 97* 98  CO2 26 25  GLUCOSE 179* 302*  BUN 21 36*  CREATININE 3.57* 4.30*  CALCIUM 8.2* 8.4*    Intake/Output Summary (Last 24 hours) at 11/30/2017 1319 Last data filed at 11/30/2017 0930 Gross per 24 hour  Intake 360 ml  Output -  Net 360 ml     Physical Exam: Vital Signs Blood pressure (!) 154/75, pulse (!) 105, temperature 99.1 F (37.3 C), resp. rate 17, height 5' (1.524 m), weight 48.1 kg, SpO2 95 %. Constitutional: No distress . Vital signs reviewed. HEENT: EOMI, oral membranes moist, edentulous Neck: supple Cardiovascular: RRR  with murmur. No JVD    Respiratory: CTA Bilaterally without wheezes or rales. Normal effort    GI: BS +, non-tender, non-distended  Musculoskeletal:  No edema or tenderness in extremities  Neurological: She is alert.  Speech fairly clear. Follows basic commands.  Motor: motor 4/5 throughout LUE with ataxia with finger to nose and decrease in fine motor movements b/l.   Skin: Skin is warm and dry.  Psychiatric: sl flat     Assessment/Plan: 1. 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  Physiatrist is providing close team supervision and 24 hour management of active medical problems listed below.  Physiatrist and rehab team continue to assess barriers to discharge/monitor patient progress toward functional and medical  goals  Care Tool:  Bathing    Body parts bathed by patient: Right arm, Left arm, Chest, Abdomen, Right upper leg, Face, Left upper leg         Bathing assist Assist Level: Minimal Assistance - Patient > 75%     Upper Body Dressing/Undressing Upper body dressing   What is the patient wearing?: Pull over shirt    Upper body assist Assist Level: Supervision/Verbal cueing    Lower Body Dressing/Undressing Lower body dressing      What is the patient wearing?: Pants     Lower body assist Assist for lower body dressing: Minimal Assistance - Patient > 75%     Toileting Toileting    Toileting assist Assist for toileting: Total Assistance - Patient < 25%     Transfers Chair/bed transfer  Transfers assist  Chair/bed transfer activity did not occur: Safety/medical concerns  Chair/bed transfer assist level: Minimal Assistance - Patient > 75%     Locomotion Ambulation   Ambulation assist      Assist level: Minimal Assistance - Patient > 75% Assistive device: Hand held assist Max distance: 107 FT   Walk 10 feet activity   Assist     Assist level: Minimal Assistance - Patient > 75% Assistive device: Hand held assist   Walk 50 feet activity   Assist    Assist level: Minimal Assistance - Patient > 75% Assistive device: Hand held assist    Walk 150 feet activity   Assist Walk 150 feet activity did not occur: Safety/medical concerns  Walk 10 feet on uneven surface  activity   Assist     Assist level: Moderate Assistance - Patient - 50 - 74% Assistive device: Hand held assist   Wheelchair     Assist Will patient use wheelchair at discharge?: No   Wheelchair activity did not occur: Safety/medical concerns         Wheelchair 50 feet with 2 turns activity    Assist    Wheelchair 50 feet with 2 turns activity did not occur: Safety/medical concerns       Wheelchair 150 feet activity     Assist Wheelchair 150 feet  activity did not occur: Safety/medical concerns        Medical Problem List and Plan: 1.  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    -beginning therapies today 2.  DVT Prophylaxis/Anticoagulation: Pharmaceutical: Lovenox 3. Pain Management: tylenol prn.  4. Mood: LCSW to follow for evaluation and support.  5. Neuropsych: This patient is not fully capable of making decisions on her own behalf. 6. Skin/Wound Care: Routine pressure relief measures measures. 7. Fluids/Electrolytes/Nutrition: Strict I's and O's.   - Labs with dialysis. 8.  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    -Neurology recommended to increase Topamax to 50 mg twice daily (on 25mg  currently0 9.  History of hydronephrosis s/p stents and recurrent E. coli UTI: Urology with recommendations to continue antibiotics. Ongoing temps  -UA+, begin keflex 250mg  bid  -ucx pending 10.  T2DM: Intake improving with improvement in mentation.  Low-dose Lantus resumed  .  -needs better control CBG (last 3)  Recent Labs    11/29/17 2149 11/30/17 0706 11/30/17 1132  GLUCAP 218* 275* 282*    11.  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  Anemia of chronic disease: Started on 12 load Fe.  Continue Aranesp.     LOS: 1 days A FACE TO [place] 11/30/2017, 1:19 PM

## 2017-11-30 NOTE — Plan of Care (Signed)
  Problem: Consults Goal: RH STROKE PATIENT EDUCATION Description See Patient Education module for education specifics  Outcome: Progressing Goal: Nutrition Consult-if indicated Outcome: Progressing Goal: Diabetes Guidelines if Diabetic/Glucose > 140 Description If diabetic or lab glucose is > 140 mg/dl - Initiate Diabetes/Hyperglycemia Guidelines & Document Interventions  Outcome: Progressing   Problem: RH SKIN INTEGRITY Goal: RH STG SKIN FREE OF INFECTION/BREAKDOWN Description Patients skin will remain free from further infection or breakdown with mod I assist.  Outcome: Progressing Goal: RH STG MAINTAIN SKIN INTEGRITY WITH ASSISTANCE Description STG Maintain Skin Integrity With mod I Assistance.  Outcome: Progressing   Problem: RH SAFETY Goal: RH STG ADHERE TO SAFETY PRECAUTIONS W/ASSISTANCE/DEVICE Description STG Adhere to Safety Precautions With mod I Assistance/Device.  Outcome: Progressing   Problem: RH KNOWLEDGE DEFICIT Goal: RH STG INCREASE KNOWLEDGE OF DIABETES Description Min assist  Outcome: Progressing Goal: RH STG INCREASE KNOWLEDGE OF HYPERTENSION Description Min assist  Outcome: Progressing Goal: RH STG INCREASE KNOWLEGDE OF HYPERLIPIDEMIA Description Min assist  Outcome: Progressing Goal: RH STG INCREASE KNOWLEDGE OF STROKE PROPHYLAXIS Description Min assist  Outcome: Progressing   Problem: RH BOWEL ELIMINATION Goal: RH STG MANAGE BOWEL WITH ASSISTANCE Description STG Manage Bowel with mod I Assistance.  Outcome: Not Progressing   Problem: RH BLADDER ELIMINATION Goal: RH STG MANAGE BLADDER WITH ASSISTANCE Description STG Manage Bladder With mod I Assistance  Outcome: Not Progressing  Incontinent of bowel and bladder requiring max assist for hygiene in bed.  Brita Romp, RN

## 2017-11-30 NOTE — Care Management Note (Signed)
Inpatient Rehabilitation Center Individual Statement of Services  Patient Name:  Erica [name]  Date:  11/30/2017  Welcome to the [facility].  Our goal is to provide you with an individualized program based on your diagnosis and situation, designed to meet your specific needs.  With this comprehensive rehabilitation program, you will be expected to participate in at least 3 hours of rehabilitation therapies Monday-Friday, with modified therapy programming on the weekends.  Your rehabilitation program will include the following services:  Physical Therapy (PT), Occupational Therapy (OT), Speech Therapy (ST), 24 hour per day rehabilitation nursing, Therapeutic Recreaction (TR), Neuropsychology, Case Management (Social Worker), Rehabilitation Medicine, Nutrition Services and Pharmacy Services  Weekly team conferences will be held on Wednesday to discuss your progress.  Your Social Worker will talk with you frequently to get your input and to update you on team discussions.  Team conferences with you and your family in attendance may also be held.  Expected length of stay: 14-16 days  Overall anticipated outcome: supervision with device  Depending on your progress and recovery, your program may change. Your Social Worker will coordinate services and will keep you informed of any changes. Your Social Worker's name and contact numbers are listed  below.  The following services may also be recommended but are not provided by the [facility]:    [facility] will be made to provide these services after discharge if needed.  Arrangements include referral to agencies that provide these services.  Your insurance has been verified to be:  medicaid Your primary doctor is:  Octavio Graves  Pertinent information will be shared with your doctor and your insurance company.  Social  Worker:  Ovidio Kin, [place] or ([phone]  Information discussed with and copy given to patient by: Elease Hashimoto, 11/30/2017, 2:48 PM

## 2017-11-30 NOTE — Progress Notes (Signed)
Scheduled phenytoin unavailable for administration; pharmacy contacted several times regarding missing dose, but medication still remains unavailable for administration. Will continue to monitor patient

## 2017-11-30 NOTE — Evaluation (Signed)
Speech Language Pathology Assessment and Plan  Patient Details  Name: Erica [name] MRN: [id number] Date of Birth: Dec 16, 1955  SLP Diagnosis: Cognitive Impairments  Rehab Potential: Good ELOS: 12-14 days     Today's Date: 11/30/2017 SLP Individual Time: 1100-1200 SLP Individual Time Calculation (min): 60 min   Problem List:  Patient Active Problem List   Diagnosis Date Noted  . Stroke ([place]) 11/29/2017  . Seizures ([place])   . Hydronephrosis   . Recurrent UTI   . Diabetes mellitus type 2 in nonobese (HCC)   . ESRD (end stage renal disease) ([place])   . Anemia of chronic disease   . Chronic diastolic congestive heart failure ([place])   . Essential hypertension   . AKI (acute kidney injury) ([place])   . PICC (peripherally inserted central catheter) in place   . Acute pyelonephritis 11/12/2017  . Acute renal failure superimposed on chronic kidney disease ([place]) 11/10/2017  . Uncontrolled type 2 diabetes mellitus with hyperglycemia, with long-term current use of insulin ([place]) 11/10/2017  . Renal failure 11/08/2017  . Seizure disorder ([place]) 11/08/2017  . Orthostatic hypotension 07/25/2014  . Moderate protein malnutrition ([place]) 09/15/2013  . Aspiration pneumonia ([place]) 09/15/2013  . Acute respiratory failure requiring reintubation ([place]) 09/11/2013  . probable Seizures due to metabolic disorder 09/32/6712  . Lactic acidosis 03/19/2013  . Abdominal pain 03/19/2013  . Rotavirus infection 10/29/2012  . Type II or unspecified type diabetes mellitus without mention of complication, uncontrolled 10/29/2012  . Protein-calorie malnutrition, severe ([place]) 10/27/2012  . NSTEMI (non-ST elevated myocardial infarction) ([place]) 10/26/2012  . Fever, unspecified 10/26/2012  . Hypotension 10/25/2012  . Alcohol withdrawal ([place]) 10/25/2012  . Metabolic acidosis 45/80/9983  . Chronic diarrhea 10/25/2012  . Tobacco abuse 10/25/2012  . DKA (diabetic ketoacidoses) ([place]) 09/09/2012  . Dehydration 09/09/2012   . DKA, type 2 ([place]) 05/20/2012  . Abnormal LFTs 05/20/2012  . Heart murmur, systolic 38/25/0539  . Diabetes mellitus ([place]) 07/08/2011  . Hypoglycemia 07/08/2011  . Metabolic encephalopathy 76/73/4193  . Alcohol intoxication ([place]) 07/08/2011  . Alcohol abuse 07/08/2011  . Hypokalemia 07/08/2011  . Nausea & vomiting 07/08/2011  . Abdominal pain 07/08/2011  . H/O chronic pancreatitis 07/08/2011   Past Medical History:  Past Medical History:  Diagnosis Date  . Alcohol-induced pancreatitis   . Chronic diarrhea   . Depression   . Diabetes mellitus    fasting blood sugar 110-120s  . Diastolic CHF ([place])   . DKA (diabetic ketoacidoses) ([place])   . Gastroparesis   . GERD (gastroesophageal reflux disease)   . Heart murmur   . History of kidney stones   . Hyperlipidemia   . Hypertension   . Hypokalemia   . Muscle spasm   . Neuropathic pain   . Neuropathy    Hx: of  . Pyelonephritis   . Recurrent pancreatitis   . Seizures ([place])   . Vitamin B12 deficiency   . Vitamin D deficiency    Past Surgical History:  Past Surgical History:  Procedure Laterality Date  . CATARACT EXTRACTION W/PHACO Right 03/14/2015   Procedure: CATARACT EXTRACTION PHACO AND INTRAOCULAR LENS PLACEMENT (IOC);  Surgeon: Baruch Goldmann, MD;  Location: [facility];  Service: Ophthalmology;  Laterality: Right;  CDE:11.13  . CATARACT EXTRACTION W/PHACO Left 04/11/2015   Procedure: CATARACT EXTRACTION PHACO AND INTRAOCULAR LENS PLACEMENT LEFT EYE CDE=9.68;  Surgeon: Baruch Goldmann, MD;  Location: [facility];  Service: Ophthalmology;  Laterality: Left;  . COLONOSCOPY  02/24/2010  . cystoscopy with ureteral stent  Bilateral  10/07/2017   At [facility] CV LINE RIGHT  11/17/2017  . IR FLUORO GUIDE CV LINE RIGHT  11/22/2017  . IR US GUIDE VASC ACCESS RIGHT  11/17/2017  . MULTIPLE EXTRACTIONS WITH ALVEOLOPLASTY N/A 06/13/2012   Procedure: MULTIPLE EXTRACION WITH ALVEOLOPLASTY EXTRACT: 18, 19, 20, 21, 22, 24, 25,  27, 28, 29, 30, 31;  Surgeon: Gae Bon, DDS;  Location: [facility];  Service: Oral Surgery;  Laterality: N/A;  . TUBAL LIGATION    . URETERAL STENT PLACEMENT  09/2017    Assessment / Plan / Recommendation Clinical Impression Erica B Reynoldsis a 62 year old female with history of T2DMwith gastropathy and neuropathy, hypertension, seizure disorder,bilateral hydronephrosis status post stent who was admitted to an outsidehospital on 11/08/2017 for weakness acute on chronic renal failure with uremia, encephalopathy, poor p.o. intake with weakness,leukocytosis and question of UTI. She was transferred to [facility] for treatment and started on hemodialysis. Dr. 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 As patient with recurrent E. Coli,he recommended keeping patient on antibiotic coverage until stents removed.She was treated with 2-week course of IV ceftriaxone.  While in dialysis on09/29, she developed sided weakness left facial twitching and increase in lethargy.She was loaded with Keppra and Topamax added due to reports of left eye twitching for 2 days and concern for partial status epilepticus.MRI of brain done revealing scattered punctate foci of acute infarct in the deep white matter of both frontal and parietal vertices consistent with watershed infarct. MRA brain limited by motion but no emergent large vessel occlusion or stenosis noted. 2D echo showed normal LV size with moderate asymmetric basal septal hypertrophy --due to HTN versus hypertrophiccardiomyopathy and EF 60 to 65%.Dr. Leonel Ramsay felt thatmultifocal infarct due to hypo-perfusion and no further modification of medical regiment recommended.  She developed fevers on 10/1 and hemodialysis catheter removed. Blood culturenegative defervesced.. Right hemodialysis catheter placed on 10/07 by Dr.  Belinda Block hemodialysis ongoing on Tuesday Thursday Saturday (accepted at [facility] TTS-12:20 PM slot).Treated with 1 unit PRBC.Therapy ongoing and patient limited by decreased processing, cognitive deficits with delayed processing, ataxia and LLE weakness.  Pt presents with severe cognitive lingustic impairments, deficits include selective attention, basic problem solving, and intellectual/safety awareness which was primarily impacted by short term recall and delayed processing. The findings listed above were supported by formal cognitive linguistic assessment utilizing Basic MOCA. Pt's family was not present to confirm cognitive baseline, however previous hospitalization to current CVA, noted overall atrophy, suggesting baseline deficits. Pt's son preformed higher level cognitive lingustic task, however pt did care for 21 year-old grandson on occasion. Pt presents with functional swallow skills with regular textured foods and thin via cup,straw, noted slight prolonged mastication due to edentulous, however no overt s/s aspiration. SLP recommends regular and thin liquid diet, educated on selection of softer foods if needed and intermittent supervision A to monitor diet tolerance. Pt would benefit from skilled ST services in order to maximize functional independence and reduce burden of care, likely requiring 24 hour supervision at discharge with continued skilled ST services.   Skilled Therapeutic Interventions          Skilled ST services focused on cognitive skills. SLP facilitated cognitive linguistic assessment, provided education on results and set goals. Pt agreed to goals and participation in skilled ST services.  Pt was left in room with call bell within reach and bed  alarm set. SLP reccomends to continue skilled services.   SLP Assessment  Patient will need skilled Speech Lanaguage Pathology Services during CIR admission    Recommendations  SLP Diet Recommendations: Thin Liquid  Administration via: Cup;Straw Medication Administration: Whole meds with liquid Supervision: Patient able to self feed;Intermittent supervision to cue for compensatory strategies Compensations: Slow rate;Small sips/bites;Minimize environmental distractions Postural Changes and/or Swallow Maneuvers: Seated upright 90 degrees;Upright 30-60 min after meal Oral Care Recommendations: Oral care QID Patient destination: Home Follow up Recommendations: 24 hour supervision/assistance Equipment Recommended: None recommended by SLP    SLP Frequency 3 to 5 out of 7 days   SLP Duration  SLP Intensity  SLP Treatment/Interventions 12-14 days   Minumum of 1-2 x/day, 30 to 90 minutes  Cognitive remediation/compensation;Cueing hierarchy;Functional tasks;Patient/family education    Pain Pain Assessment Pain Score: 0-No pain  Prior Functioning Cognitive/Linguistic Baseline: Baseline deficits Baseline deficit details: family not present to confirm but noted atrophy on 2015 MRI Type of Home: Apartment  Lives With: Son Available Help at Discharge: Other (Comment) Education: 8th grade   Short Term Goals: Week 1: SLP Short Term Goal 1 (Week 1): Pt will demonstrate selective attention in moderately distracting environment with min A verbal cues in 20 minute intervals. SLP Short Term Goal 2 (Week 1): Pt will return demonstration of 2 safety precautions during functional tasks with Mod A verbal cues. SLP Short Term Goal 3 (Week 1): Pt will complete basic and familiar tasks with Mod A verbal cues for functional problem solving. SLP Short Term Goal 4 (Week 1): Pt will identify 2 cognitive and 2 physical changes due to CVA with min A verbal cues. SLP Short Term Goal 5 (Week 1): Pt will utilize external memory aids to recall new, daily information with mod A verbal cues.  Refer to Care Plan for Long Term Goals  Recommendations for other services: None   Discharge Criteria: Patient will be discharged from  SLP if patient refuses treatment 3 consecutive times without medical reason, if treatment goals not met, if there is a change in medical status, if patient makes no progress towards goals or if patient is discharged from hospital.  The above assessment, treatment plan, treatment alternatives and goals were discussed and mutually agreed upon: by patient  Kenyah Luba  [facility] 11/30/2017, 1:54 PM

## 2017-11-30 NOTE — Progress Notes (Signed)
Called Hemo to inquire whether patient would be dialyzed today.  No orders were written to dialyze and nephrology note did not say for sure HD would be done or held for today.  Hemo RN called Dr. Jimmy Footman and verified that the patient will NOT be coming do dialysis today.  Brita Romp, RN

## 2017-11-30 NOTE — Progress Notes (Signed)
[facility] for phenytoin dosing Indication: Patient safety/ therapeutic monitoring  Allergies  Allergen Reactions  . Aspirin Palpitations    Patient Measurements: Height: 5' (152.4 cm) Weight: 106 lb 0.7 oz (48.1 kg) IBW/kg (Calculated) : 45.5   Vital Signs: Temp: 99.1 F (37.3 C) (10/15 0536) BP: 154/75 (10/15 0536) Pulse Rate: 105 (10/15 0536) Intake/Output from previous day: 10/14 0701 - 10/15 0700 In: 120 [P.O.:120] Out: -  Intake/Output from this shift: No intake/output data recorded.  Labs: Recent Labs    11/29/17 0931  CREATININE 3.57*  PHOS 2.5  ALBUMIN 1.8*   Estimated Creatinine Clearance: 11.6 mL/min (A) (by C-G formula based on SCr of 3.57 mg/dL (H)).    Medical History: Past Medical History:  Diagnosis Date  . Alcohol-induced pancreatitis   . Chronic diarrhea   . Depression   . Diabetes mellitus    fasting blood sugar 110-120s  . Diastolic CHF ([place])   . DKA (diabetic ketoacidoses) ([place])   . Gastroparesis   . GERD (gastroesophageal reflux disease)   . Heart murmur   . History of kidney stones   . Hyperlipidemia   . Hypertension   . Hypokalemia   . Muscle spasm   . Neuropathic pain   . Neuropathy    Hx: of  . Pyelonephritis   . Recurrent pancreatitis   . Seizures ([place])   . Vitamin B12 deficiency   . Vitamin D deficiency     Medications:  Scheduled:  . bacitracin   Topical BID  . Chlorhexidine Gluconate Cloth  6 each Topical Q0600  . [START ON 12/02/2017] darbepoetin (ARANESP) injection - DIALYSIS  150 mcg Intravenous Q Thu-HD  . feeding supplement (NEPRO CARB STEADY)  237 mL Oral BID WC  . heparin  5,000 Units Subcutaneous Q8H  . insulin aspart  0-9 Units Subcutaneous TID WC  . insulin glargine  5 Units Subcutaneous Daily  . levETIRAcetam  1,000 mg Oral Daily  . levETIRAcetam  500 mg Oral Q T,Th,Sat-1800  . multivitamin  1 tablet Oral QHS  . phenytoin  125 mg Oral BID  . saccharomyces  boulardii  250 mg Oral BID  . topiramate  25 mg Oral BID    Assessment: 62 yo F recently initiated on dialysis started on phenytoin inpatient for breakthrough seizures. Neurology initially followed pt & wanted pharmacy to take over dosing for phenytoin. Pt's dose was reduced from 100mg  q8h to 125mg  q12h on 10/2.   Last total phenytoin level of 7.9 corrected to 17.2 with low albumin in ESRD. No level since this point. No further seizures noted.     Monitoring Corrected phenytoin level = 10-45mcg/mL  Plan:  Continue phenytoin 125mg  q12h Monitor clinical status, seizures, phenytoin levels prn  Elenor Quinones, PharmD, BCPS Clinical Pharmacist Phone number [phone] 11/30/2017 7:48 AM

## 2017-11-30 NOTE — Progress Notes (Signed)
Social Work  Social Work Assessment and Plan  Patient Details  Name: Erica [name] MRN: [id number] Date of Birth: [birth year]  Today's Date: 11/30/2017  Problem List:  Patient Active Problem List   Diagnosis Date Noted  . Stroke ([place]) 11/29/2017  . Seizures ([place])   . Hydronephrosis   . Recurrent UTI   . Diabetes mellitus type 2 in nonobese (HCC)   . ESRD (end stage renal disease) ([place])   . Anemia of chronic disease   . Chronic diastolic congestive heart failure ([place])   . Essential hypertension   . AKI (acute kidney injury) ([place])   . PICC (peripherally inserted central catheter) in place   . Acute pyelonephritis 11/12/2017  . Acute renal failure superimposed on chronic kidney disease ([place]) 11/10/2017  . Uncontrolled type 2 diabetes mellitus with hyperglycemia, with long-term current use of insulin ([place]) 11/10/2017  . Renal failure 11/08/2017  . Seizure disorder ([place]) 11/08/2017  . Orthostatic hypotension 07/25/2014  . Moderate protein malnutrition ([place]) 09/15/2013  . Aspiration pneumonia ([place]) 09/15/2013  . Acute respiratory failure requiring reintubation ([place]) 09/11/2013  . probable Seizures due to metabolic disorder 90/24/0973  . Lactic acidosis 03/19/2013  . Abdominal pain 03/19/2013  . Rotavirus infection 10/29/2012  . Type II or unspecified type diabetes mellitus without mention of complication, uncontrolled 10/29/2012  . Protein-calorie malnutrition, severe ([place]) 10/27/2012  . NSTEMI (non-ST elevated myocardial infarction) ([place]) 10/26/2012  . Fever, unspecified 10/26/2012  . Hypotension 10/25/2012  . Alcohol withdrawal ([place]) 10/25/2012  . Metabolic acidosis 53/29/9242  . Chronic diarrhea 10/25/2012  . Tobacco abuse 10/25/2012  . DKA (diabetic ketoacidoses) ([place]) 09/09/2012  . Dehydration 09/09/2012  . DKA, type 2 ([place]) 05/20/2012  . Abnormal LFTs 05/20/2012  . Heart murmur, systolic 68/34/1962  . Diabetes mellitus ([place]) 07/08/2011  . Hypoglycemia 07/08/2011   . Metabolic encephalopathy 22/97/9892  . Alcohol intoxication ([place]) 07/08/2011  . Alcohol abuse 07/08/2011  . Hypokalemia 07/08/2011  . Nausea & vomiting 07/08/2011  . Abdominal pain 07/08/2011  . H/O chronic pancreatitis 07/08/2011   Past Medical History:  Past Medical History:  Diagnosis Date  . Alcohol-induced pancreatitis   . Chronic diarrhea   . Depression   . Diabetes mellitus    fasting blood sugar 110-120s  . Diastolic CHF ([place])   . DKA (diabetic ketoacidoses) ([place])   . Gastroparesis   . GERD (gastroesophageal reflux disease)   . Heart murmur   . History of kidney stones   . Hyperlipidemia   . Hypertension   . Hypokalemia   . Muscle spasm   . Neuropathic pain   . Neuropathy    Hx: of  . Pyelonephritis   . Recurrent pancreatitis   . Seizures ([place])   . Vitamin B12 deficiency   . Vitamin D deficiency    Past Surgical History:  Past Surgical History:  Procedure Laterality Date  . CATARACT EXTRACTION W/PHACO Right 03/14/2015   Procedure: CATARACT EXTRACTION PHACO AND INTRAOCULAR LENS PLACEMENT (IOC);  Surgeon: Baruch Goldmann, MD;  Location: [facility];  Service: Ophthalmology;  Laterality: Right;  CDE:11.13  . CATARACT EXTRACTION W/PHACO Left 04/11/2015   Procedure: CATARACT EXTRACTION PHACO AND INTRAOCULAR LENS PLACEMENT LEFT EYE CDE=9.68;  Surgeon: Baruch Goldmann, MD;  Location: [facility];  Service: Ophthalmology;  Laterality: Left;  . COLONOSCOPY  02/24/2010  . cystoscopy with ureteral stent  Bilateral 10/07/2017   At [facility] CV LINE RIGHT  11/17/2017  . IR FLUORO GUIDE CV LINE RIGHT  11/22/2017  . IR US GUIDE VASC ACCESS RIGHT  11/17/2017  . MULTIPLE EXTRACTIONS WITH ALVEOLOPLASTY N/A 06/13/2012   Procedure: MULTIPLE EXTRACION WITH ALVEOLOPLASTY EXTRACT: 18, 19, 20, 21, 22, 24, 25, 27, 28, 29, 30, 31;  Surgeon: Gae Bon, DDS;  Location: [facility];  Service: Oral Surgery;  Laterality: N/A;  . TUBAL LIGATION    . URETERAL STENT PLACEMENT   09/2017   Social History:  reports that she has never smoked. Her smokeless tobacco use includes snuff. She reports that she drinks about 1.0 standard drinks of alcohol per week. She reports that she does not use drugs.  Family / Support Systems Marital Status: Separated Patient Roles: Parent Children: Lorenda Hatchet [phone]  [facility] Brown-daughter [phone] Other Supports: extended family Anticipated Caregiver: Son and siblings-if not a facility Ability/Limitations of Caregiver: Son works first and at times second shift at the [employer]. See if one of her sister's will assist while he is working Caregiver Availability: Other (Comment)(Depends upon pt's level of care) Family Dynamics: Pt lives with her son, her daughter is in [place] but has a small child. She has numerous sisters-7 and one brother. She also has friends who are supportive and will visit her.  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 Will look toward her children since there is no formal POA in place, until she is able to take on this capacity   Abuse/Neglect Abuse/Neglect Assessment Can Be Completed: Yes Physical Abuse: Denies Verbal Abuse: Denies Sexual Abuse: Denies Exploitation of patient/patient's resources: Denies Self-Neglect: Denies  Emotional Status Pt's affect, behavior adn adjustment status: Pt is motivated to do well and needs to get stronger while here. She has been ill and had many procedures while here. She reports she was declining at home and had been back and forth to the hospital numerous times before being admitted. She wants to be able to go back home but be safe home alone while son workjs. Recent Psychosocial  Issues: many health issues that brought her into the hospital. Pyschiatric History: History of depression takes medications which she feels helps her. Would benefit from seeing neuro-psych while here due to past history and health issues here. She is hoping she will not need to be n HD when leaves here. Substance Abuse History: No issues according to pt  Patient / Family Perceptions, Expectations & Goals Pt/Family understanding of illness & functional limitations: Pt and son can explain her health issues although she reports she has not been spoken with about the HD and if this will be long term. She is hoping not she would like a choice in this matter. She feels she is becoming clearer and will talk with the MD's about this. Premorbid pt/family roles/activities: Mom, sister, grandmother, retiree, friend, church member, etc Anticipated changes in roles/activities/participation: resume Pt/family expectations/goals: Pt states: " I want to get stronger and be able to do for myself like I always have."  Son states: " I hope she can be independent but want her needs met.'  [employer]: None Premorbid Home Care/DME Agencies: Other (Comment)(rw, cane and tub seat) Transportation available at discharge: Family members Resource referrals recommended: Neuropsychology, Support group (specify)  Discharge Planning Living Arrangements: Children Support Systems: Children, Other relatives, Friends/neighbors, Church/faith community Type of Residence:  Private residence Insurance Resources: [employer] (specify county) Pensions consultant: SSI, Family Support Financial Screen Referred: No Living Expenses: Education officer, community Management: Patient, Family Does the patient have any problems obtaining your medications?: No Home Management: Both she and son Patient/Family Preliminary Plans: Plans to return home with son and hoopefully will be mod/i level. She will ask her sister's to assist her  while he works and see what their schedules are. She has spoken about an ALF-[facility] close to her home. Will await evaluations and work on the best plan for pt.  Sw Barriers to Discharge: Decreased caregiver support Sw Barriers to Discharge Comments: Does not have 24 hr in place right now. Possibily new HD patient Social Work Anticipated Follow Up Needs: HH/OP, Support Group  Clinical Impression Pleasant female who has been through a lot with her medical issues. She is hoping she will not need HD when she goes home. Will work on best plan for her, may need 24 hr supervision at discharge and will need to come up with a plan for this. Will make neuro-psych referral do feel pt will benefit from this. Work on discharge plans.  Elease Hashimoto 11/30/2017, 2:43 PM

## 2017-12-01 ENCOUNTER — Inpatient Hospital Stay (HOSPITAL_COMMUNITY): Payer: Medicaid Other

## 2017-12-01 ENCOUNTER — Inpatient Hospital Stay (HOSPITAL_COMMUNITY): Payer: Medicaid Other | Admitting: Speech Pathology

## 2017-12-01 ENCOUNTER — Inpatient Hospital Stay (HOSPITAL_COMMUNITY): Payer: Medicaid Other | Admitting: Physical Therapy

## 2017-12-01 ENCOUNTER — Inpatient Hospital Stay (HOSPITAL_COMMUNITY): Payer: Medicaid Other | Admitting: *Deleted

## 2017-12-01 ENCOUNTER — Inpatient Hospital Stay (HOSPITAL_COMMUNITY): Payer: Medicaid Other | Admitting: Occupational Therapy

## 2017-12-01 LAB — RENAL FUNCTION PANEL
ANION GAP: 12 (ref 5–15)
Albumin: 2 g/dL — ABNORMAL LOW (ref 3.5–5.0)
BUN: 42 mg/dL — ABNORMAL HIGH (ref 8–23)
CALCIUM: 8.7 mg/dL — AB (ref 8.9–10.3)
CHLORIDE: 98 mmol/L (ref 98–111)
CO2: 21 mmol/L — AB (ref 22–32)
Creatinine, Ser: 4.69 mg/dL — ABNORMAL HIGH (ref 0.44–1.00)
GFR calc Af Amer: 10 mL/min — ABNORMAL LOW (ref >60)
GFR calc non Af Amer: 9 mL/min — ABNORMAL LOW (ref >60)
GLUCOSE: 159 mg/dL — AB (ref 70–99)
POTASSIUM: 4.4 mmol/L (ref 3.5–5.1)
Phosphorus: 3.9 mg/dL (ref 2.5–4.6)
SODIUM: 131 mmol/L — AB (ref 135–145)

## 2017-12-01 LAB — GLUCOSE, CAPILLARY
GLUCOSE-CAPILLARY: 144 mg/dL — AB (ref 70–99)
Glucose-Capillary: 160 mg/dL — ABNORMAL HIGH (ref 70–99)
Glucose-Capillary: 102 mg/dL — ABNORMAL HIGH (ref 70–99)
Glucose-Capillary: 128 mg/dL — ABNORMAL HIGH (ref 70–99)

## 2017-12-01 LAB — URINE CULTURE

## 2017-12-01 MED ORDER — INSULIN GLARGINE 100 UNIT/ML ~~LOC~~ SOLN
10.0000 [IU] | Freq: Every day | SUBCUTANEOUS | Status: DC
  Administered 2017-12-02 – 2017-12-03 (×2): 10 [IU] via SUBCUTANEOUS
  Filled 2017-12-01 (×2): qty 0.1

## 2017-12-01 NOTE — Progress Notes (Signed)
Physical Therapy Session Note  Patient Details  Name: Erica [name] MRN: [id number] Date of Birth: 1955-11-04  Today's Date: 12/01/2017 PT Individual Time:1445  - 1515 30 min individual tx     Short Term Goals: Week 1:  PT Short Term Goal 1 (Week 1): Pt will ambulate 75 ft with LRAD & supervision.  PT Short Term Goal 2 (Week 1): Pt will negotiate stairs with 1 rail and min assist for strengtening & balance.  Skilled Therapeutic Interventions/Progress Updates:   Pt seated in wc. Reciprocal scooting forward/backward with>without use of UEs for pelvic dissociation.  Min assist for L hip movement.    Sit>< stand with bil hands clasped in front of her, x 5 , focusing on head/hips relationship and eccentric control.    Gait training without AD x 150' with min assist except for 2 LOB to R requiring mod assist.  Reaching activity in standing, with R hand reaching out of BOS to R to retrieve small objects, rotate trunk and drop object to her L, x 8 without LOB, with L hand within or at BOS to retrieve 10 small objects,  Min assist except for 1 LOB when reaching L, requiring mod assist.  Pt left resting in w/c with seat belt alarm set and needs at hand.    Therapy Documentation Precautions:  Precautions Precautions: Fall Precaution Comments: seizures Restrictions Weight Bearing Restrictions: No   Pain: pt denies     Therapy/Group: Individual Therapy  Ty Oshima 12/01/2017, 9:42 AM

## 2017-12-01 NOTE — Progress Notes (Signed)
Speech Language Pathology Daily Session Note  Patient Details  Name: Erica [name] MRN: [id number] Date of Birth: [birth year]  Today's Date: 12/01/2017 SLP Individual Time: 0900-1000 SLP Individual Time Calculation (min): 60 min  Short Term Goals: Week 1: SLP Short Term Goal 1 (Week 1): Pt will demonstrate selective attention in moderately distracting environment with min A verbal cues in 20 minute intervals. SLP Short Term Goal 2 (Week 1): Pt will return demonstration of 2 safety precautions during functional tasks with Mod A verbal cues. SLP Short Term Goal 3 (Week 1): Pt will complete basic and familiar tasks with Mod A verbal cues for functional problem solving. SLP Short Term Goal 4 (Week 1): Pt will identify 2 cognitive and 2 physical changes due to CVA with min A verbal cues. SLP Short Term Goal 5 (Week 1): Pt will utilize external memory aids to recall new, daily information with mod A verbal cues.  Skilled Therapeutic Interventions:  Skilled treatment session focused on cognition goals. SLP facilitated session by providing  basic money management tasks. Pt initially required more than a reasonable amount of time to count money but then as task progressed, pt with decreased ability.  Pt able to demonstrate selective attention to basic calendar task with Mod I and selective attention to tasks in moderately distracting environment with Mod I. Pt with no short term recall which is likely d/t acoholism. Will continue to assess, returned to room, left upright in wheelchair with lap belt alarm on and all needs within reach. Continue per current plan of care.        Pain Pain Assessment Pain Scale: 0-10 Pain Score: 0-No pain  Therapy/Group: Individual Therapy  Erica [name] 12/01/2017, 10:46 AM

## 2017-12-01 NOTE — Progress Notes (Signed)
   12/01/17 1000  Clinical Encounter Type  Visited With Patient;Health care provider  Visit Type Initial  Referral From Nurse  Responded to [facility] consult for AD. Patient was in speech therapy. Gave patient AD documents for her to review with her son who is the one who asked for the forms. Patient agreed and said she will contact us when notary is needed.

## 2017-12-01 NOTE — Patient Care Conference (Signed)
Inpatient RehabilitationTeam Conference and Plan of Care Update Date: 12/01/2017   Time: 10:50 AM    Patient Name: Erica [name]      Medical Record Number: [id number]  Date of Birth: 1956/02/13 Sex: Female         Room/Bed: 4W19C/4W19C-01 Payor Info: Payor: MEDICAID [place] / Plan: MEDICAID [place] ACCESS / Product Type: *No Product type* /    Admitting Diagnosis: CVA  Admit Date/Time:  11/29/2017  4:31 PM Admission Comments: No comment available   Primary Diagnosis:  <principal problem not specified> Principal Problem: <principal problem not specified>  Patient Active Problem List   Diagnosis Date Noted  . Stroke ([place]) 11/29/2017  . Seizures ([place])   . Hydronephrosis   . Recurrent UTI   . Diabetes mellitus type 2 in nonobese (HCC)   . ESRD (end stage renal disease) ([place])   . Anemia of chronic disease   . Chronic diastolic congestive heart failure ([place])   . Essential hypertension   . AKI (acute kidney injury) ([place])   . PICC (peripherally inserted central catheter) in place   . Acute pyelonephritis 11/12/2017  . Acute renal failure superimposed on chronic kidney disease ([place]) 11/10/2017  . Uncontrolled type 2 diabetes mellitus with hyperglycemia, with long-term current use of insulin ([place]) 11/10/2017  . Renal failure 11/08/2017  . Seizure disorder ([place]) 11/08/2017  . Orthostatic hypotension 07/25/2014  . Moderate protein malnutrition ([place]) 09/15/2013  . Aspiration pneumonia ([place]) 09/15/2013  . Acute respiratory failure requiring reintubation ([place]) 09/11/2013  . probable Seizures due to metabolic disorder 73/53/2992  . Lactic acidosis 03/19/2013  . Abdominal pain 03/19/2013  . Rotavirus infection 10/29/2012  . Type II or unspecified type diabetes mellitus without mention of complication, uncontrolled 10/29/2012  . Protein-calorie malnutrition, severe ([place]) 10/27/2012  . NSTEMI (non-ST elevated myocardial infarction) ([place]) 10/26/2012  . Fever, unspecified 10/26/2012  .  Hypotension 10/25/2012  . Alcohol withdrawal ([place]) 10/25/2012  . Metabolic acidosis 42/68/3419  . Chronic diarrhea 10/25/2012  . Tobacco abuse 10/25/2012  . DKA (diabetic ketoacidoses) ([place]) 09/09/2012  . Dehydration 09/09/2012  . DKA, type 2 ([place]) 05/20/2012  . Abnormal LFTs 05/20/2012  . Heart murmur, systolic 62/22/9798  . Diabetes mellitus ([place]) 07/08/2011  . Hypoglycemia 07/08/2011  . Metabolic encephalopathy 92/12/9415  . Alcohol intoxication ([place]) 07/08/2011  . Alcohol abuse 07/08/2011  . Hypokalemia 07/08/2011  . Nausea & vomiting 07/08/2011  . Abdominal pain 07/08/2011  . H/O chronic pancreatitis 07/08/2011    Expected Discharge Date: Expected Discharge Date: 12/14/17  Team Members Present: Physician leading conference: Dr. Delice Lesch Social Worker Present: Ovidio Kin, LCSW Nurse Present: Rayetta Pigg, RN PT Present: Other (comment)(Taylor Turkalo-PT) OT Present: Simonne Come, OT SLP Present: Stormy Fabian, SLP PPS Coordinator present : Daiva Nakayama, RN, CRRN     Current Status/Progress Goal Weekly Team Focus  Medical   watershed infarct with cognitive and gait/balance deficits. HTN, UTI, ESRD  improve functional balance  rx UTI, improve consistent fluid intake, new diarrhea---rx   Bowel/Bladder   incontinence of bowel, diarrhea, LBM 11/30/17, continent of bladder  less episodes of incontinence & stool  monitor stool episodes   Swallow/Nutrition/ Hydration             ADL's   min assist bathing and LB dressing, min assist stand pivot transfers, Supervision/setup UB dressing.  Impaired memory and decreased awareness  Supervision goals  ADL retraining, dynamic standing balance, functional transfers, impaired cognition, decreased endurance   Mobility   min<>mod assist HHA overall,  decreased endurance, min assist stairs with B rails, impaired cognition  supervision overall with LRAD  L NMR, strengthening, balance, gait, transfers, stair negotiation, safety  awareness   Communication             Safety/Cognition/ Behavioral Observations  Max A for basic problem solving, and recall  Min A  selective attention, basic problem solving   Pain   no c/o pain on shift, has topamax scheduled & tylenol prn  pain scale <2/10  assess & treat as needed   Skin   rt groin incision, no drsg, applying bacitracin to incision site  no new areas of skin break down  assess q shift      *See Care Plan and progress notes for long and short-term goals.     Barriers to Discharge  Current Status/Progress Possible Resolutions Date Resolved   Physician    Medical stability        see medical progress notes      Nursing  Hemodialysis               PT  Decreased caregiver support;Lack of/limited family support;Behavior  son works - not sure if pt has recommended 24 hr supervision at d/c, impaired cognition              OT Decreased caregiver support  pt's son works              SLP                SW Decreased caregiver support Does not have 24 hr in place right now. Possibily new HD patient            Discharge Planning/Teaching Needs:  Unsure of plan lives with son who works during the day but checking with sister's to see if can assist. Will need HD as an OP      Team Discussion:  Goals supervision level. Currently min-mod assist level. Balance, safety and endurance team is working on. Has Memory issues so will need someone with her at home upon discharge. Being treated for UTI. Renal following to see if will need OP-HD or if her kidneys will recover form her AKI. Poor awareness of her deficits. Work with family on best option for pt at DC  Revisions to Treatment Plan:  DC 10/29    Continued Need for Acute Rehabilitation Level of Care: The patient requires daily medical management by a physician with specialized training in physical medicine and rehabilitation for the following conditions: Daily direction of a multidisciplinary physical rehabilitation  program to ensure safe treatment while eliciting the highest outcome that is of practical value to the patient.: Yes Daily medical management of patient stability for increased activity during participation in an intensive rehabilitation regime.: Yes Daily analysis of laboratory values and/or radiology reports with any subsequent need for medication adjustment of medical intervention for : Neurological problems;Blood pressure problems   I attest that I was present, lead the team conference, and concur with the assessment and plan of the team.   Elease Hashimoto 12/01/2017, 2:43 PM

## 2017-12-01 NOTE — Evaluation (Signed)
Recreational Therapy Assessment and Plan  Patient Details  Name: Erica [name] MRN: [id number] Date of Birth: 1955/05/26 Today's Date: 12/01/2017  Rehab Potential: Good ELOS: discharge 10/29  Assessment  Problem List:      Patient Active Problem List   Diagnosis Date Noted  . Stroke ([place]) 11/29/2017  . Seizures ([place])   . Hydronephrosis   . Recurrent UTI   . Diabetes mellitus type 2 in nonobese (HCC)   . ESRD (end stage renal disease) ([place])   . Anemia of chronic disease   . Chronic diastolic congestive heart failure ([place])   . Essential hypertension   . AKI (acute kidney injury) ([place])   . PICC (peripherally inserted central catheter) in place   . Acute pyelonephritis 11/12/2017  . Acute renal failure superimposed on chronic kidney disease ([place]) 11/10/2017  . Uncontrolled type 2 diabetes mellitus with hyperglycemia, with long-term current use of insulin ([place]) 11/10/2017  . Renal failure 11/08/2017  . Seizure disorder ([place]) 11/08/2017  . Orthostatic hypotension 07/25/2014  . Moderate protein malnutrition ([place]) 09/15/2013  . Aspiration pneumonia ([place]) 09/15/2013  . Acute respiratory failure requiring reintubation ([place]) 09/11/2013  . probable Seizures due to metabolic disorder 31/54/0086  . Lactic acidosis 03/19/2013  . Abdominal pain 03/19/2013  . Rotavirus infection 10/29/2012  . Type II or unspecified type diabetes mellitus without mention of complication, uncontrolled 10/29/2012  . Protein-calorie malnutrition, severe ([place]) 10/27/2012  . NSTEMI (non-ST elevated myocardial infarction) ([place]) 10/26/2012  . Fever, unspecified 10/26/2012  . Hypotension 10/25/2012  . Alcohol withdrawal ([place]) 10/25/2012  . Metabolic acidosis 76/19/5093  . Chronic diarrhea 10/25/2012  . Tobacco abuse 10/25/2012  . DKA (diabetic ketoacidoses) ([place]) 09/09/2012  . Dehydration 09/09/2012  . DKA, type 2 ([place]) 05/20/2012  . Abnormal LFTs 05/20/2012  . Heart murmur, systolic  26/71/2458  . Diabetes mellitus ([place]) 07/08/2011  . Hypoglycemia 07/08/2011  . Metabolic encephalopathy 09/98/3382  . Alcohol intoxication ([place]) 07/08/2011  . Alcohol abuse 07/08/2011  . Hypokalemia 07/08/2011  . Nausea & vomiting 07/08/2011  . Abdominal pain 07/08/2011  . H/O chronic pancreatitis 07/08/2011    Past Medical History:      Past Medical History:  Diagnosis Date  . Alcohol-induced pancreatitis   . Chronic diarrhea   . Depression   . Diabetes mellitus    fasting blood sugar 110-120s  . Diastolic CHF ([place])   . DKA (diabetic ketoacidoses) ([place])   . Gastroparesis   . GERD (gastroesophageal reflux disease)   . Heart murmur   . History of kidney stones   . Hyperlipidemia   . Hypertension   . Hypokalemia   . Muscle spasm   . Neuropathic pain   . Neuropathy    Hx: of  . Pyelonephritis   . Recurrent pancreatitis   . Seizures ([place])   . Vitamin B12 deficiency   . Vitamin D deficiency    Past Surgical History:       Past Surgical History:  Procedure Laterality Date  . CATARACT EXTRACTION W/PHACO Right 03/14/2015   Procedure: CATARACT EXTRACTION PHACO AND INTRAOCULAR LENS PLACEMENT (IOC);  Surgeon: Baruch Goldmann, MD;  Location: [facility];  Service: Ophthalmology;  Laterality: Right;  CDE:11.13  . CATARACT EXTRACTION W/PHACO Left 04/11/2015   Procedure: CATARACT EXTRACTION PHACO AND INTRAOCULAR LENS PLACEMENT LEFT EYE CDE=9.68;  Surgeon: Baruch Goldmann, MD;  Location: [facility];  Service: Ophthalmology;  Laterality: Left;  . COLONOSCOPY  02/24/2010  . cystoscopy with ureteral stent  Bilateral 10/07/2017   At [facility]   .  IR FLUORO GUIDE CV LINE RIGHT  11/17/2017  . IR FLUORO GUIDE CV LINE RIGHT  11/22/2017  . IR US GUIDE VASC ACCESS RIGHT  11/17/2017  . MULTIPLE EXTRACTIONS WITH ALVEOLOPLASTY N/A 06/13/2012   Procedure: MULTIPLE EXTRACION WITH ALVEOLOPLASTY EXTRACT: 18, 19, 20, 21, 22, 24, 25, 27, 28, 29, 30, 31;  Surgeon: Gae Bon, DDS;  Location: [facility];  Service: Oral Surgery;  Laterality: N/A;  . TUBAL LIGATION    . URETERAL STENT PLACEMENT  09/2017    Assessment & Plan Clinical Impression: Patient is a 62 y.o. year old female with history of T2DM with gastropathy and neuropathy, hypertension, seizure disorder, bilateral hydronephrosis status post stent who was admitted to [facility] on 11/08/2017 for weakness acute on chronic renal failure with uremia, encephalopathy, poor p.o. intake with weakness, leukocytosis and question of UTI. History taken from chart review. She was transferred to [facility] for treatment and started on hemodialysis. Dr. 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 As patient with recurrent E. Coli, he recommended keeping patient on antibiotic coverage until stents removed. She was treated with 2-week course of IV ceftriaxone.  While in dialysis on 09/29, she developed sided weakness left facial twitching and increase in lethargy. She was loaded with Keppra and Topamax added due to reports of left eye twitching for 2 days and concern for partial status epilepticus. MRI of brain reviewed, showing multifocal infarct. Per report, scattered punctate foci of acute infarct in the deep white matter of both frontal and parietal vertices consistent with watershed infarct. MRA brain limited by motion but no emergent large vessel occlusion or stenosis noted. 2D echo showed normal LV size with moderate asymmetric basal septal hypertrophy --due to HTN versus hypertrophic cardiomyopathy and EF 60 to 65%. Dr. Leonel Ramsay felt that multifocal infarct due to hypo-perfusion and no further modification of medical regiment recommended.  She developed fevers on 10/1 and hemodialysis catheter removed. Blood culture negative defervesced. Right hemodialysis catheter placed on  10/07 by Dr. Barbie Banner and hemodialysis ongoing on Tuesday Thursday Saturday (accepted at [facility] TTS-12:20 PM slot). Treated with 1 unit PRBC. Therapy ongoing and patient limited by decreased processing, cognitive deficits with delayed processing, ataxia and LLE weakness. CIR recommended for follow-up therapy.  Patient transferred to CIR on 11/29/2017.   Pt presents with decreased activity tolerance, decreased functional mobility, decreased balance, decreased coordination, decreased awareness, decreased problem solving, decreased safety awareness, decreased memory Limiting pt's independence with leisure/community pursuits.  Plan Min 1 TR session >20 minutes during LOS  Recommendations for other services: None   Discharge Criteria: Patient will be discharged from TR if patient refuses treatment 3 consecutive times without medical reason.  If treatment goals not met, if there is a change in medical status, if patient makes no progress towards goals or if patient is discharged from hospital.  The above assessment, treatment plan, treatment alternatives and goals were discussed and mutually agreed upon: by patient  [place] 12/01/2017, 3:34 PM

## 2017-12-01 NOTE — Plan of Care (Signed)
  Problem: Spiritual Needs Goal: Ability to function at adequate level Outcome: Progressing   Problem: Consults Goal: RH STROKE PATIENT EDUCATION Description See Patient Education module for education specifics  Outcome: Progressing Goal: Nutrition Consult-if indicated Outcome: Progressing Goal: Diabetes Guidelines if Diabetic/Glucose > 140 Description If diabetic or lab glucose is > 140 mg/dl - Initiate Diabetes/Hyperglycemia Guidelines & Document Interventions  Outcome: Progressing   Problem: RH BOWEL ELIMINATION Goal: RH STG MANAGE BOWEL WITH ASSISTANCE Description STG Manage Bowel with mod I Assistance.  Outcome: Progressing   Problem: RH BLADDER ELIMINATION Goal: RH STG MANAGE BLADDER WITH ASSISTANCE Description STG Manage Bladder With mod I Assistance  Outcome: Progressing   Problem: RH SKIN INTEGRITY Goal: RH STG SKIN FREE OF INFECTION/BREAKDOWN Description Patients skin will remain free from further infection or breakdown with mod I assist.  Outcome: Progressing Goal: RH STG MAINTAIN SKIN INTEGRITY WITH ASSISTANCE Description STG Maintain Skin Integrity With mod I Assistance.  Outcome: Progressing   Problem: RH SAFETY Goal: RH STG ADHERE TO SAFETY PRECAUTIONS W/ASSISTANCE/DEVICE Description STG Adhere to Safety Precautions With mod I Assistance/Device.  Outcome: Progressing   Problem: RH KNOWLEDGE DEFICIT Goal: RH STG INCREASE KNOWLEDGE OF DIABETES Description Min assist  Outcome: Progressing Goal: RH STG INCREASE KNOWLEDGE OF HYPERTENSION Description Min assist  Outcome: Progressing Goal: RH STG INCREASE KNOWLEGDE OF HYPERLIPIDEMIA Description Min assist  Outcome: Progressing Goal: RH STG INCREASE KNOWLEDGE OF STROKE PROPHYLAXIS Description Min assist  Outcome: Progressing

## 2017-12-01 NOTE — Progress Notes (Signed)
Social Work Patient ID: Erica [name], female   DOB: 22-Nov-1955, 62 y.o.   MRN: [id number]  Met with pt and spoke with son via telephone to discuss team conference goals supervision level and target discharge 10/29. Will need to wait to see if renal feels she will need HD at discharge or if her kidneys begin working. Pt wants to go home and her sister has said she will assist her. Son asked about the stents that were suppose to be removed and have been in for 2-3 extra weeks. Will work on safe discharge plan for home of aware of alternative option of NHP.

## 2017-12-01 NOTE — Progress Notes (Signed)
Subjective: Interval History: has no complaint .  Objective: Vital signs in last 24 hours: Temp:  [97.9 F (36.6 C)-98.4 F (36.9 C)] 97.9 F (36.6 C) (10/16 0450) Pulse Rate:  [86-90] 88 (10/16 0450) Resp:  [17-18] 17 (10/16 0450) BP: (130-158)/(66-79) 130/66 (10/16 0450) SpO2:  [98 %-100 %] 100 % (10/16 0450) Weight:  [47.5 kg] 47.5 kg (10/16 0500) Weight change: -2.9 kg  Intake/Output from previous day: 10/15 0701 - 10/16 0700 In: 720 [P.O.:720] Out: -  Intake/Output this shift: No intake/output data recorded.  General appearance: alert, cooperative, no distress and pale Resp: clear to auscultation bilaterally Chest wall: RIJ PC Cardio: S1, S2 normal and systolic murmur: systolic ejection 2/6, crescendo and decrescendo at 2nd left intercostal space GI: soft, non-tender; bowel sounds normal; no masses,  no organomegaly Extremities: extremities normal, atraumatic, no cyanosis or edema  Lab Results: No results for input(s): WBC, HGB, HCT, PLT in the last 72 hours. BMET:  Recent Labs    11/30/17 1209 12/01/17 0533  NA 132* 131*  K 4.5 4.4  CL 98 98  CO2 25 21*  GLUCOSE 302* 159*  BUN 36* 42*  CREATININE 4.30* 4.69*  CALCIUM 8.4* 8.7*   No results for input(s): PTH in the last 72 hours. Iron Studies: No results for input(s): IRON, TIBC, TRANSFERRIN, FERRITIN in the last 72 hours.  Studies/Results: No results found.  I have reviewed the patient's current medications.  Assessment/Plan: 1 ESRD  Slow rise in Scr, mild acidemia.  Appears to have some recovery of function, but how much not clear yet.  Does not need HD today 2 UTI Culture pending 3 Hematuria need to make sure clears 4 Anemia stable 5 HPTH check 6 Debill 7 UTIs  Has stents 8 obstruction stents still in P C&S, follow Cr, chem, vol, Rehab   LOS: 2 days   Erica [name] 12/01/2017,8:18 AM

## 2017-12-01 NOTE — Progress Notes (Signed)
Occupational Therapy Session Note  Patient Details  Name: Erica [name] MRN: [id number] Date of Birth: 1955/11/02  Today's Date: 12/01/2017 OT Individual Time: 1300-1400 OT Individual Time Calculation (min): 60 min    Short Term Goals: Week 1:  OT Short Term Goal 1 (Week 1): Pt will complete LB dressing with supervision OT Short Term Goal 2 (Week 1): Pt will complete bathing with supervision at sit > stand level OT Short Term Goal 3 (Week 1): Pt will complete toilet transfers ambulating with LRAD OT Short Term Goal 4 (Week 1): Pt will complete 2 grooming tasks in standing to increase activity tolerance   Skilled Therapeutic Interventions/Progress Updates:    Treatment session with focus on dynamic standing balance, endurance, and functional use of BUE.  Pt received upright in w/c agreeable to therapy session.  Engaged in 9 hole peg test in sitting with Rt: 1:03 and Lt: 1:12.  Engaged in resistive clothespin task in standing with focus on following various patterns to challenge memory and alternating UE.  Noted increased ataxia in LUE this session with clothespins, pt also reporting increased numbness/tingling in LUE.  Static standing balance throughout clothespin task supervision.  Pt demonstrating improved activity tolerance and standing balance.  Pt required frequent seated rest breaks due to decreased activity tolerance.  Ambulated 4' with supervision while pushing w/c and hand held assist 69' with slow shuffling step.  Pt returned to room and left seated upright in w/c with seat belt alarm on and all needs in reach.  Therapy Documentation Precautions:  Precautions Precautions: Fall Precaution Comments: seizures Restrictions Weight Bearing Restrictions: No General:   Vital Signs: Therapy Vitals Temp: 98.6 F (37 C) Temp Source: Oral Pulse Rate: 86 Resp: 14 BP: (!) 145/81 Patient Position (if appropriate): Sitting Oxygen Therapy SpO2: 100 % O2 Device: Room Air Pain:  Pt with no c/o pain   Therapy/Group: Individual Therapy  Simonne Come 12/01/2017, 4:04 PM

## 2017-12-01 NOTE — Progress Notes (Signed)
[place] PHYSICAL MEDICINE & REHABILITATION PROGRESS NOTE   Subjective/Complaints: Patient in good spirits.  She slept well.  Denies pain.  Had a good day with therapies yesterday.   ROS: Patient denies fever, rash, sore throat, blurred vision, nausea, vomiting, diarrhea, cough, shortness of breath or chest pain, joint or back pain, headache, or mood change.   Objective:   No results found. No results for input(s): WBC, HGB, HCT, PLT in the last 72 hours. Recent Labs    11/30/17 1209 12/01/17 0533  NA 132* 131*  K 4.5 4.4  CL 98 98  CO2 25 21*  GLUCOSE 302* 159*  BUN 36* 42*  CREATININE 4.30* 4.69*  CALCIUM 8.4* 8.7*    Intake/Output Summary (Last 24 hours) at 12/01/2017 1004 Last data filed at 11/30/2017 1805 Gross per 24 hour  Intake 480 ml  Output -  Net 480 ml     Physical Exam: Vital Signs Blood pressure 130/66, pulse 88, temperature 97.9 F (36.6 C), temperature source Oral, resp. rate 17, height 5' (1.524 m), weight 47.5 kg, SpO2 100 %. Constitutional: No distress . Vital signs reviewed. HEENT: EOMI, oral membranes moist Neck: supple Cardiovascular: RRR without murmur. No JVD    Respiratory: CTA Bilaterally without wheezes or rales. Normal effort    GI: BS +, non-tender, non-distended  Musculoskeletal:  No edema or tenderness in extremities  Neurological: She is alert.  Speech fairly clear. Follows basic commands.  Motor: motor 4/5 throughout LUE with mild ataxia with finger to nose and decrease in fine motor movements b/l.   Skin: Skin is warm and dry.  Psychiatric: cooperative and pleasant     Assessment/Plan: 1. 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  Physiatrist is providing close team supervision and 24 hour management of active medical problems listed below.  Physiatrist and rehab team continue to  assess barriers to discharge/monitor patient progress toward functional and medical goals  Care Tool:  Bathing    Body parts bathed by patient: Right arm, Left arm, Chest, Abdomen, Right upper leg, Face, Left upper leg         Bathing assist Assist Level: Minimal Assistance - Patient > 75%     Upper Body Dressing/Undressing Upper body dressing   What is the patient wearing?: Pull over shirt    Upper body assist Assist Level: Contact Guard/Touching assist    Lower Body Dressing/Undressing Lower body dressing      What is the patient wearing?: Pants     Lower body assist Assist for lower body dressing: Minimal Assistance - Patient > 75%     Toileting Toileting    Toileting assist Assist for toileting: Total Assistance - Patient < 25%     Transfers Chair/bed transfer  Transfers assist  Chair/bed transfer activity did not occur: Safety/medical concerns  Chair/bed transfer assist level: Minimal Assistance - Patient > 75%     Locomotion Ambulation   Ambulation assist      Assist level: Minimal Assistance - Patient > 75% Assistive device: Hand held assist Max distance: 107 FT   Walk 10 feet activity   Assist     Assist level: Minimal Assistance - Patient > 75% Assistive device: Hand held assist   Walk 50 feet activity   Assist    Assist level: Minimal Assistance - Patient > 75% Assistive device: Hand held assist    Walk 150  feet activity   Assist Walk 150 feet activity did not occur: Safety/medical concerns         Walk 10 feet on uneven surface  activity   Assist     Assist level: Moderate Assistance - Patient - 50 - 74% Assistive device: Hand held assist   Wheelchair     Assist Will patient use wheelchair at discharge?: No   Wheelchair activity did not occur: Safety/medical concerns         Wheelchair 50 feet with 2 turns activity    Assist    Wheelchair 50 feet with 2 turns activity did not occur:  Safety/medical concerns       Wheelchair 150 feet activity     Assist Wheelchair 150 feet activity did not occur: Safety/medical concerns        Medical Problem List and Plan: 1.  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    --Continue CIR therapies including PT, OT, and SLP  2.  DVT Prophylaxis/Anticoagulation: Pharmaceutical: Lovenox 3. Pain Management: tylenol prn.  4. Mood: LCSW to follow for evaluation and support.  5. Neuropsych: This patient is not fully capable of making decisions on her own behalf. 6. Skin/Wound Care: Routine pressure relief measures measures. 7. Fluids/Electrolytes/Nutrition: Strict I's and O's.   - Labs with dialysis. 8.  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    -Neurology recommended to increase Topamax to 50 mg twice daily (on 25mg  currently)---no change at present 9.  History of hydronephrosis s/p stents and recurrent E. coli UTI: Urology with recommendations to continue antibiotics. Ongoing temps  -UA+   -ucx multispecies  -continue keflex for 3 days then stop 10.  T2DM: Intake improving with improvement in mentation.  Low-dose Lantus resumed  .  -Increase Lantus insulin to 10 units daily CBG (last 3)  Recent Labs    11/30/17 1640 11/30/17 2206 12/01/17 0644  GLUCAP 155* 102* 160*    11.  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  Anemia of chronic disease: Started on 12 load Fe.  Continue Aranesp.     LOS: 2 days A FACE TO [place] 12/01/2017, 10:04 AM

## 2017-12-01 NOTE — Progress Notes (Signed)
Physical Therapy Session Note  Patient Details  Name: Erica [name] MRN: [id number] Date of Birth: 08-17-1955  Today's Date: 12/01/2017 PT Individual Time: 0800-0900 PT Individual Time Calculation (min): 60 min   Short Term Goals: Week 1:  PT Short Term Goal 1 (Week 1): Pt will ambulate 75 ft with LRAD & supervision.  PT Short Term Goal 2 (Week 1): Pt will negotiate stairs with 1 rail and min assist for strengtening & balance.  Skilled Therapeutic Interventions/Progress Updates:    Pt received seated in bed, agreeable to PT. No complaints of pain. Bed mobility with Supervision. Sit to stand with min A and no AD. Standing balance with min A while donning pants and shirt. SPT bed to w/c with min A, verbal cues for safety and hand placement during transfer. Ambulation 2 x 100 ft with no AD and min A (HHA) with occasional use of rail in hallway for balance. Pt ambulates with decreased gait speed and decreased B step length. Pt reports decreased control of LLE with gait. Standing RLE 6" step up with BUE support and min A, LLE 6" step-taps with BUE support and min A. Toilet transfer with min A and use of grab bar. Pt is dependent for pericare and brief change due to some bowel incontinence. Side-steps 2 x 15 ft L/R with HHA and min A for balance. Pt left seated in w/c in room with needs in reach, quick-release belt in place.  Therapy Documentation Precautions:  Precautions Precautions: Fall Precaution Comments: seizures Restrictions Weight Bearing Restrictions: No Pain: Pain Assessment Pain Scale: 0-10 Pain Score: 0-No pain   Therapy/Group: Individual Therapy  Excell Seltzer, PT, DPT  12/01/2017, 10:32 AM

## 2017-12-02 ENCOUNTER — Inpatient Hospital Stay (HOSPITAL_COMMUNITY): Payer: Medicaid Other | Admitting: Physical Therapy

## 2017-12-02 ENCOUNTER — Inpatient Hospital Stay (HOSPITAL_COMMUNITY): Payer: Medicaid Other | Admitting: Occupational Therapy

## 2017-12-02 ENCOUNTER — Inpatient Hospital Stay (HOSPITAL_COMMUNITY): Payer: Medicaid Other | Admitting: Speech Pathology

## 2017-12-02 LAB — GLUCOSE, CAPILLARY
GLUCOSE-CAPILLARY: 223 mg/dL — AB (ref 70–99)
Glucose-Capillary: 57 mg/dL — ABNORMAL LOW (ref 70–99)
Glucose-Capillary: 131 mg/dL — ABNORMAL HIGH (ref 70–99)
Glucose-Capillary: 132 mg/dL — ABNORMAL HIGH (ref 70–99)
Glucose-Capillary: 271 mg/dL — ABNORMAL HIGH (ref 70–99)

## 2017-12-02 LAB — RENAL FUNCTION PANEL
ALBUMIN: 2.1 g/dL — AB (ref 3.5–5.0)
ANION GAP: 12 (ref 5–15)
BUN: 45 mg/dL — AB (ref 8–23)
CO2: 21 mmol/L — ABNORMAL LOW (ref 22–32)
Calcium: 8.8 mg/dL — ABNORMAL LOW (ref 8.9–10.3)
Chloride: 98 mmol/L (ref 98–111)
Creatinine, Ser: 4.93 mg/dL — ABNORMAL HIGH (ref 0.44–1.00)
GFR calc Af Amer: 10 mL/min — ABNORMAL LOW (ref >60)
GFR calc non Af Amer: 9 mL/min — ABNORMAL LOW (ref >60)
Glucose, Bld: 75 mg/dL (ref 70–99)
PHOSPHORUS: 4.7 mg/dL — AB (ref 2.5–4.6)
POTASSIUM: 4.7 mmol/L (ref 3.5–5.1)
Sodium: 131 mmol/L — ABNORMAL LOW (ref 135–145)

## 2017-12-02 MED ORDER — DARBEPOETIN ALFA 150 MCG/0.3ML IJ SOSY
150.0000 ug | PREFILLED_SYRINGE | INTRAMUSCULAR | Status: DC
  Administered 2017-12-02 – 2017-12-09 (×2): 150 ug via SUBCUTANEOUS
  Filled 2017-12-02 (×2): qty 0.3

## 2017-12-02 MED ORDER — SODIUM CHLORIDE 0.9 % IV SOLN
125.0000 mg | INTRAVENOUS | Status: AC
  Filled 2017-12-02 (×6): qty 10

## 2017-12-02 NOTE — Progress Notes (Signed)
Physical Therapy Session Note  Patient Details  Name: Erica [name] MRN: [id number] Date of Birth: [birth year]  Today's Date: 12/02/2017 PT Individual Time: 1100-1152 PT Individual Time Calculation (min): 52 min   Short Term Goals: Week 1:  PT Short Term Goal 1 (Week 1): Pt will ambulate 75 ft with LRAD & supervision.  PT Short Term Goal 2 (Week 1): Pt will negotiate stairs with 1 rail and min assist for strengtening & balance.  Skilled Therapeutic Interventions/Progress Updates:   Pt in w/c and agreeable to therapy, no c/o pain. Session focused on gait training and pre-gait NMR tasks. Ambulated to therapy gym w/ min assist via HHA. 1 seated rest break 2/2 fatigue. Very slow gait pattern w/ shuffling gait, pt unable to correct w/ verbal cues. Pre-gait tasks included standing to RW and performing reciprocal toe taps to visual target on floor and then on 2" step to work on foot clearance. Performed tasks w/ UE support on RW x2 fading to x1 to challenge balance. Worked on lateral weight shifting and trunk rotation w/ clothespin reaching task. Performed w/o UE support and verbal/tactile cues for balance strategies. Min assist to close supervision for task all pre-gait tasks. Ambulated back to room w/ RW, supervision w/ improved gait speed and more symmetrical gait pattern. Declined rest break on way back, pt eager to get back to room 2/2 fatigue. Ended session in w/c, all needs in reach.   Therapy Documentation Precautions:  Precautions Precautions: Fall Precaution Comments: seizures Restrictions Weight Bearing Restrictions: No Pain: Pain Assessment Pain Scale: 0-10 Pain Score: 0-No pain  Therapy/Group: Individual Therapy  Aizen Duval K Xara Paulding 12/02/2017, 12:03 PM

## 2017-12-02 NOTE — Progress Notes (Signed)
[place] PHYSICAL MEDICINE & REHABILITATION PROGRESS NOTE   Subjective/Complaints: Up in bed. No new complaints. Still with some itching near cental catheter  ROS: Patient denies fever, rash, sore throat, blurred vision, nausea, vomiting, diarrhea, cough, shortness of breath or chest pain, joint or back pain, headache, or mood change.    Objective:   No results found. No results for input(s): WBC, HGB, HCT, PLT in the last 72 hours. Recent Labs    12/01/17 0533 12/02/17 0407  NA 131* 131*  K 4.4 4.7  CL 98 98  CO2 21* 21*  GLUCOSE 159* 75  BUN 42* 45*  CREATININE 4.69* 4.93*  CALCIUM 8.7* 8.8*    Intake/Output Summary (Last 24 hours) at 12/02/2017 0939 Last data filed at 12/02/2017 0830 Gross per 24 hour  Intake 240 ml  Output -  Net 240 ml     Physical Exam: Vital Signs Blood pressure (!) 160/83, pulse 87, temperature 98.3 F (36.8 C), resp. rate 18, height 5' (1.524 m), weight 47.5 kg, SpO2 98 %. Constitutional: No distress . Vital signs reviewed. HEENT: EOMI, oral membranes moist Neck: supple Cardiovascular: RRR without murmur. No JVD    Respiratory: CTA Bilaterally without wheezes or rales. Normal effort    GI: BS +, non-tender, non-distended  Musculoskeletal:  No edema or tenderness in extremities  Neurological: She is alert.  Speech fairly clear. Follows basic commands.  Motor: motor 4/5 throughout LUE with mild ataxia with finger to nose and decrease in fine motor movements b/l.   Skin: Skin is warm and dry.  Psychiatric: cooperative and pleasant     Assessment/Plan: 1. 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  Physiatrist is providing close team supervision and 24 hour management of active medical problems listed below.  Physiatrist and rehab team continue to assess barriers to discharge/monitor patient  progress toward functional and medical goals  Care Tool:  Bathing    Body parts bathed by patient: Right arm, Left arm, Chest, Abdomen, Right upper leg, Face, Left upper leg         Bathing assist Assist Level: Minimal Assistance - Patient > 75%     Upper Body Dressing/Undressing Upper body dressing   What is the patient wearing?: Pull over shirt    Upper body assist Assist Level: Contact Guard/Touching assist    Lower Body Dressing/Undressing Lower body dressing      What is the patient wearing?: Pants     Lower body assist Assist for lower body dressing: Moderate Assistance - Patient 50 - 74%     Toileting Toileting    Toileting assist Assist for toileting: Minimal Assistance - Patient > 75%     Transfers Chair/bed transfer  Transfers assist     Chair/bed transfer assist level: Minimal Assistance - Patient > 75%     Locomotion Ambulation   Ambulation assist      Assist level: Moderate Assistance - Patient 50 - 74% Assistive device: Hand held assist Max distance: 100'   Walk 10 feet activity   Assist     Assist level: Minimal Assistance - Patient > 75% Assistive device: Hand held assist   Walk 50 feet activity   Assist    Assist level: Moderate Assistance - Patient - 50 - 74% Assistive device: Hand held assist    Walk 150 feet activity   Assist Walk 150 feet activity did not  occur: Safety/medical concerns         Walk 10 feet on uneven surface  activity   Assist     Assist level: Moderate Assistance - Patient - 50 - 74% Assistive device: Hand held assist   Wheelchair     Assist Will patient use wheelchair at discharge?: No   Wheelchair activity did not occur: Safety/medical concerns         Wheelchair 50 feet with 2 turns activity    Assist    Wheelchair 50 feet with 2 turns activity did not occur: Safety/medical concerns       Wheelchair 150 feet activity     Assist Wheelchair 150 feet  activity did not occur: Safety/medical concerns        Medical Problem List and Plan: 1.  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    --Continue CIR therapies including PT, OT, and SLP  2.  DVT Prophylaxis/Anticoagulation: Pharmaceutical: Lovenox 3. Pain Management: tylenol prn.  4. Mood: LCSW to follow for evaluation and support.  5. Neuropsych: This patient is not fully capable of making decisions on her own behalf. 6. Skin/Wound Care: Routine pressure relief measures measures.  -will add prn benadryl at night to help with itching 7. Fluids/Electrolytes/Nutrition: Strict I's and O's.   - Labs with dialysis. 8.  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    -Neurology recommended to increase Topamax to 50 mg twice daily (on 25mg  currently)---no change at present 9.  History of hydronephrosis s/p stents and recurrent E. coli UTI: Urology with recommendations to continue antibiotics. Ongoing temps  -UA+   -ucx multispecies  -3 day course of keflex completed 10.  T2DM: Intake improving with improvement in mentation.  Low-dose Lantus resumed  .  -Increased Lantus insulin to 10 units daily  -cbg low this am. Encourage appropriate PM po intake CBG (last 3)  Recent Labs    12/01/17 2146 12/02/17 0629 12/02/17 0803  GLUCAP 128* 57* 131*    11.  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  Anemia of chronic disease: Started on 12 load Fe.  Continue Aranesp.     LOS: 3 days A FACE TO FACE EVALUATION WAS PERFORMED  Meredith Staggers 12/02/2017, 9:39 AM

## 2017-12-02 NOTE — IPOC Note (Signed)
Overall Plan of Care [facility]) Patient Details Name: RIVER MCKERCHER MRN: [id number] DOB: 1955-12-28  Admitting Diagnosis: <principal problem not specified>  Hospital Problems: Active Problems:   [facility]roke [facility])     Functional Problem List: Nursing Bladder, Bowel, Endurance, Medication Management, Motor, Safety, Sensory, Skin Integrity  PT Balance, Behavior, Endurance, Motor, Safety  OT Balance, Cognition, Endurance, Motor, Pain, Safety  SLP Cognition  TR         Basic ADL's: OT Grooming, Bathing, Dressing, Toileting     Advanced  ADL's: OT Simple Meal Preparation     Transfers: PT Bed Mobility, Bed to Chair, Car, Furniture, Floor  OT Toilet, Tub/Shower     Locomotion: PT Ambulation, Stairs     Additional Impairments: OT Fuctional Use of Upper Extremity  SLP Social Cognition   Problem Solving, Memory, Awareness, Attention  TR      Anticipated Outcomes Item Anticipated Outcome  Self Feeding Mod I  Swallowing      Basic self-care  Supervision  Toileting  Supervision   Bathroom Transfers Supervision  Bowel/Bladder  Mod I assist  Transfers  supervision with LRAD  Locomotion  supervision with LRAD  Communication     Cognition  Min A  Pain  < 3  Safety/Judgment  Mod I assist   Therapy Plan: PT Intensity: Minimum of 1-2 x/day ,45 to 90 minutes PT Frequency: 5 out of 7 days PT Duration Estimated Length of Stay: 1.5-2 weeks OT Intensity: Minimum of 1-2 x/day, 45 to 90 minutes OT Frequency: 5 out of 7 days OT Duration/Estimated Length of Stay: 14-16 days SLP Intensity: Minumum of 1-2 x/day, 30 to 90 minutes SLP Frequency: 3 to 5 out of 7 days SLP Duration/Estimated Length of Stay: 12-14 days     Team Interventions: Nursing Interventions Patient/Family Education, Bladder Management, Bowel Management, Disease Management/Prevention, Medication Management  PT interventions Ambulation/gait training, Cognitive remediation/compensation, Discharge planning,  DME/adaptive equipment instruction, Functional mobility training, Pain management, Psychosocial support, Splinting/orthotics, Therapeutic Activities, UE/LE Strength taining/ROM, Visual/perceptual remediation/compensation, Wheelchair propulsion/positioning, UE/LE Coordination activities, Therapeutic Exercise, Stair training, Skin care/wound management, Neuromuscular re-education, Patient/family education, Functional electrical stimulation, Disease management/prevention, Academic librarian, Training and development officer  OT Interventions Training and development officer, Cognitive remediation/compensation, Discharge planning, Disease mangement/prevention, DME/adaptive equipment instruction, Functional mobility training, Neuromuscular re-education, Pain management, Patient/family education, Psychosocial support, Self Care/advanced ADL retraining, Therapeutic Activities, Therapeutic Exercise, UE/LE Strength taining/ROM, UE/LE Coordination activities  SLP Interventions Cognitive remediation/compensation, Cueing hierarchy, Functional tasks, Patient/family education  TR Interventions    SW/CM Interventions Discharge Planning, Psychosocial Support, Patient/Family Education   Barriers to Discharge MD  Medical stability  Nursing Hemodialysis    PT Decreased caregiver support, Lack of/limited family support, Behavior son works - not sure if pt has recommended 24 hr supervision at d/c, impaired cognition  OT Decreased caregiver support pt's son works   SLP      SW Decreased caregiver support Does not have 24 hr in place right now. Possibily new HD patient   Team Discharge Planning: Destination: PT-Home ,OT- Home , SLP-Home Projected Follow-up: PT-Home health PT, 24 hour supervision/assistance, OT-  Home health OT, 24 hour supervision/assistance, SLP-24 hour supervision/assistance Projected Equipment Needs: PT-To be determined, OT- Tub/shower bench, SLP-None recommended by SLP Equipment Details: PT- , OT-   Patient/family involved in discharge planning: PT- Patient,  OT-Patient, SLP-Patient  MD ELOS: 13-15 days Medical Rehab Prognosis:  Excellent Assessment: The patient has been admitted for CIR therapies with the diagnosis of cva. The team will be addressing functional mobility, strength, stamina,  balance, safety, adaptive techniques and equipment, self-care, bowel and bladder mgt, patient and caregiver education, NMR, visual-spatial awareness, community reentry, cognition. Goals have been set at supervision for self-care and mobility and   min assist with cognition.    Meredith Staggers, MD, FAAPMR      See Team Conference Notes for weekly updates to the plan of care n

## 2017-12-02 NOTE — Progress Notes (Signed)
Occupational Therapy Session Note  Patient Details  Name: Erica [name] MRN: [id number] Date of Birth: 05/06/1955  Today's Date: 12/02/2017 OT Individual Time: 1006-1100 OT Individual Time Calculation (min): 54 min    Short Term Goals: Week 1:  OT Short Term Goal 1 (Week 1): Pt will complete LB dressing with supervision OT Short Term Goal 2 (Week 1): Pt will complete bathing with supervision at sit > stand level OT Short Term Goal 3 (Week 1): Pt will complete toilet transfers ambulating with LRAD OT Short Term Goal 4 (Week 1): Pt will complete 2 grooming tasks in standing to increase activity tolerance   Skilled Therapeutic Interventions/Progress Updates:    Treatment session with focus on dynamic standing balance and sequencing during self-care tasks and therapeutic activities.  Pt received upright in w/c having just returned from toileting with nurse tech.  Engaged in bathing and dressing at sit > stand level at sink with pt completing all tasks with supervision.  Pt demonstrating improved sit > stand and dynamic standing balance.  Contact guard provided when pulling pants over hips.  Engaged in oral care and brushing hair in standing with supervision for standing balance, pt demonstrating increased endurance and activity tolerance.  Engaged in 15 piece puzzle activity in standing with pt requires min-mod question cues to identify errors.  Returned to room and passed off to PT.  Therapy Documentation Precautions:  Precautions Precautions: Fall Precaution Comments: seizures Restrictions Weight Bearing Restrictions: No General:   Vital Signs: Therapy Vitals Pulse Rate: 91 Resp: 18 BP: (!) 157/90 Patient Position (if appropriate): Lying Oxygen Therapy SpO2: 100 % O2 Device: Room Air Pain:  Pt with no c/o pain   Therapy/Group: Individual Therapy  Simonne Come 12/02/2017, 3:44 PM

## 2017-12-02 NOTE — Progress Notes (Signed)
Subjective: Interval History: has no complaint , would like to get PC out when poss.  Objective: Vital signs in last 24 hours: Temp:  [97.8 F (36.6 C)-98.6 F (37 C)] 98.3 F (36.8 C) (10/17 0506) Pulse Rate:  [86-87] 87 (10/17 0506) Resp:  [14-18] 18 (10/17 0506) BP: (145-166)/(77-83) 160/83 (10/17 0506) SpO2:  [98 %-100 %] 98 % (10/17 0506) Weight change:   Intake/Output from previous day: 10/16 0701 - 10/17 0700 In: 240 [P.O.:240] Out: -  Intake/Output this shift: No intake/output data recorded.  General appearance: alert, cooperative, cachectic and no distress Resp: clear to auscultation bilaterally Chest wall: RIJ PC Cardio: S1, S2 normal and systolic murmur: systolic ejection 2/6, crescendo and decrescendo at 2nd left intercostal space GI: soft, non-tender; bowel sounds normal; no masses,  no organomegaly Extremities: extremities normal, atraumatic, no cyanosis or edema  Lab Results: No results for input(s): WBC, HGB, HCT, PLT in the last 72 hours. BMET:  Recent Labs    12/01/17 0533 12/02/17 0407  NA 131* 131*  K 4.4 4.7  CL 98 98  CO2 21* 21*  GLUCOSE 159* 75  BUN 42* 45*  CREATININE 4.69* 4.93*  CALCIUM 8.7* 8.8*   No results for input(s): PTH in the last 72 hours. Iron Studies: No results for input(s): IRON, TIBC, TRANSFERRIN, FERRITIN in the last 72 hours.  Studies/Results: No results found.  I have reviewed the patient's current medications.  Assessment/Plan: 1 AKI Cr rise slow. Not uremic ,mild acidemia. Does not need dialysis 2 Obstructive uropathy 3 Urosepsis/pyelo stents still in 4 Debill per rehab. 5 Anemia check Hb and see if can d/c esa 6 DM controlled 7 [place] P follow chem , vol, cont rehab.     LOS: 3 days   Jeneen Rinks Kamen Hanken 12/02/2017,8:09 AM

## 2017-12-02 NOTE — Plan of Care (Signed)
  Problem: Spiritual Needs Goal: Ability to function at adequate level Outcome: Progressing   Problem: Consults Goal: RH STROKE PATIENT EDUCATION Description See Patient Education module for education specifics  Outcome: Progressing Goal: Nutrition Consult-if indicated Outcome: Progressing Goal: Diabetes Guidelines if Diabetic/Glucose > 140 Description If diabetic or lab glucose is > 140 mg/dl - Initiate Diabetes/Hyperglycemia Guidelines & Document Interventions  Outcome: Progressing   Problem: RH BOWEL ELIMINATION Goal: RH STG MANAGE BOWEL WITH ASSISTANCE Description STG Manage Bowel with mod I Assistance.  Outcome: Progressing Flowsheets (Taken 12/02/2017 1246) STG: Pt will manage bowels with assistance: 4-Minimum assistance   Problem: RH SKIN INTEGRITY Goal: RH STG SKIN FREE OF INFECTION/BREAKDOWN Description Patients skin will remain free from further infection or breakdown with mod I assist.  Outcome: Progressing Goal: RH STG MAINTAIN SKIN INTEGRITY WITH ASSISTANCE Description STG Maintain Skin Integrity With mod I Assistance.  Outcome: Progressing Flowsheets (Taken 12/02/2017 1246) STG: Maintain skin integrity with assistance: 4-Minimal assistance   Problem: RH SAFETY Goal: RH STG ADHERE TO SAFETY PRECAUTIONS W/ASSISTANCE/DEVICE Description STG Adhere to Safety Precautions With mod I Assistance/Device.  Outcome: Progressing Flowsheets (Taken 12/02/2017 1246) STG:Pt will adhere to safety precautions with assistance/device: 4-Minimal assistance   Problem: RH KNOWLEDGE DEFICIT Goal: RH STG INCREASE KNOWLEDGE OF DIABETES Description Min assist  Outcome: Progressing Goal: RH STG INCREASE KNOWLEDGE OF HYPERTENSION Description Min assist  Outcome: Progressing Goal: RH STG INCREASE KNOWLEGDE OF HYPERLIPIDEMIA Description Min assist  Outcome: Progressing Goal: RH STG INCREASE KNOWLEDGE OF STROKE PROPHYLAXIS Description Min assist  Outcome: Progressing

## 2017-12-02 NOTE — Progress Notes (Signed)
Physical Therapy Session Note  Patient Details  Name: Erica [name] MRN: [id number] Date of Birth: 10-28-1955  Today's Date: 12/02/2017 PT Individual Time: 1700-1730 PT Individual Time Calculation (min): 30 min   Short Term Goals: Week 1:  PT Short Term Goal 1 (Week 1): Pt will ambulate 75 ft with LRAD & supervision.  PT Short Term Goal 2 (Week 1): Pt will negotiate stairs with 1 rail and min assist for strengtening & balance.  Skilled Therapeutic Interventions/Progress Updates:   Pt received supine in bed and agreeable to PT. Supine>sit transfer with supervision assist increased time. Cues from PT for proper use of bed features to improve safety.   Stand pivot transfer to [facility] with RW and supervision assist from PT. Pt transported to rehab gym in [facility]. Throughout treatment pt performed sit<>stand x 4 with supervision assist with min cues for proper push from [facility] to improve safety and facilitate increased anterior weight shift.   Gait training instructed by PT with RW and supervision assist x 19f and 557f Min cues for AD management in turns as well as posture and step length to reduce energy expenditure.    [facility] and all needs met.         Therapy Documentation Precautions:  Precautions Precautions: Fall Precaution Comments: seizures Restrictions Weight Bearing Restrictions: No Vital Signs: Therapy Vitals Temp: 98.1 F (36.7 C) Pulse Rate: 91 Resp: 18 BP: (!) 157/90 Patient Position (if appropriate): Lying Oxygen Therapy SpO2: 100 % O2 Device: Room Air Pain: denies   Therapy/Group: Individual Therapy  AuLorie Phenix0/17/2019, 5:58 PM

## 2017-12-02 NOTE — Progress Notes (Signed)
Speech Language Pathology Discharge Summary  Patient Details  Name: SHYANA KULAKOWSKI MRN: [id number] Date of Birth: 1955/03/03  Today's Date: 12/02/2017 SLP Individual Time: 0900-1000 SLP Individual Time Calculation (min): 60 min   Skilled Therapeutic Interventions:  Skilled treatment session focused on cognition goals as well and education with pt's son. SLP obtained baseline information on pt's cognitive function prior to this admission. At  baseline, pt lived with son for 4 to 5 years, in attempt to get pt away from alcoholic friends. At baseline, pt with memory deficits and word finding issues that are likely related to memory deficits. These deficits are at baseline and are likely attributed to alcohol use as she was experiencing these memory deficits prior to admission. At baseline, son provides all care with pt sitting sipping on coffee during the day. At this time, skilled ST is not indicated as there are no indicated acute or exacerbated cognitive issues. Spoke to interdisciplinary team and they are also in agreement with discharging from [place] services.    Patient has met   of 4 long term goals.  Patient to discharge at overall Mod level.  Reasons goals not met: Pt required more than Min A at baseline, per son's report, pt's cognitive linguistic function is at baseline.     Care Partner:  Caregiver Able to Provide Assistance: Other (comment)(Uncertain at this time - at admission to [facility] he was requesting placement in ALF)     Recommendation:  24 hour supervision/assistance       Reasons for discharge: Other (comment)(pt is at baseline cognitive status)   Patient/Family Agrees with Progress Made and Goals Achieved: Yes    Meryl Ponder 12/02/2017, 9:41 AM

## 2017-12-03 ENCOUNTER — Inpatient Hospital Stay (HOSPITAL_COMMUNITY): Payer: Medicaid Other | Admitting: Physical Therapy

## 2017-12-03 ENCOUNTER — Inpatient Hospital Stay (HOSPITAL_COMMUNITY): Payer: Medicaid Other

## 2017-12-03 ENCOUNTER — Inpatient Hospital Stay (HOSPITAL_COMMUNITY): Payer: Medicaid Other | Admitting: Speech Pathology

## 2017-12-03 LAB — RENAL FUNCTION PANEL
ANION GAP: 11 (ref 5–15)
Albumin: 2.1 g/dL — ABNORMAL LOW (ref 3.5–5.0)
BUN: 57 mg/dL — ABNORMAL HIGH (ref 8–23)
CO2: 20 mmol/L — ABNORMAL LOW (ref 22–32)
CREATININE: 5.51 mg/dL — AB (ref 0.44–1.00)
Calcium: 8.6 mg/dL — ABNORMAL LOW (ref 8.9–10.3)
Chloride: 98 mmol/L (ref 98–111)
GFR, EST AFRICAN AMERICAN: 9 mL/min — AB (ref >60)
GFR, EST NON AFRICAN AMERICAN: 7 mL/min — AB (ref >60)
Glucose, Bld: 330 mg/dL — ABNORMAL HIGH (ref 70–99)
POTASSIUM: 4.8 mmol/L (ref 3.5–5.1)
Phosphorus: 4.9 mg/dL — ABNORMAL HIGH (ref 2.5–4.6)
Sodium: 129 mmol/L — ABNORMAL LOW (ref 135–145)

## 2017-12-03 LAB — CBC
HCT: 28.3 % — ABNORMAL LOW (ref 36.0–46.0)
Hemoglobin: 8.7 g/dL — ABNORMAL LOW (ref 12.0–15.0)
MCH: 28.9 pg (ref 26.0–34.0)
MCHC: 30.7 g/dL (ref 30.0–36.0)
MCV: 94 fL (ref 80.0–100.0)
NRBC: 0 % (ref 0.0–0.2)
Platelets: 346 10*3/uL (ref 150–400)
RBC: 3.01 MIL/uL — AB (ref 3.87–5.11)
RDW: 21.7 % — AB (ref 11.5–15.5)
WBC: 7.3 10*3/uL (ref 4.0–10.5)

## 2017-12-03 LAB — ALBUMIN: ALBUMIN: 2.2 g/dL — AB (ref 3.5–5.0)

## 2017-12-03 LAB — GLUCOSE, CAPILLARY
GLUCOSE-CAPILLARY: 289 mg/dL — AB (ref 70–99)
GLUCOSE-CAPILLARY: 244 mg/dL — AB (ref 70–99)
Glucose-Capillary: 198 mg/dL — ABNORMAL HIGH (ref 70–99)
Glucose-Capillary: 190 mg/dL — ABNORMAL HIGH (ref 70–99)

## 2017-12-03 LAB — PHENYTOIN LEVEL, TOTAL: Phenytoin Lvl: 5 ug/mL — ABNORMAL LOW (ref 10.0–20.0)

## 2017-12-03 MED ORDER — INSULIN GLARGINE 100 UNIT/ML ~~LOC~~ SOLN
14.0000 [IU] | Freq: Every day | SUBCUTANEOUS | Status: DC
  Administered 2017-12-04: 14 [IU] via SUBCUTANEOUS
  Filled 2017-12-03: qty 0.14

## 2017-12-03 MED ORDER — PHENYTOIN 125 MG/5ML PO SUSP
150.0000 mg | Freq: Two times a day (BID) | ORAL | Status: DC
  Administered 2017-12-03 – 2017-12-16 (×25): 150 mg via ORAL
  Filled 2017-12-03 (×30): qty 8

## 2017-12-03 NOTE — Progress Notes (Signed)
[place] for phenytoin dosing Indication: Patient safety/ therapeutic monitoring  Allergies  Allergen Reactions  . Aspirin Palpitations    Patient Measurements: Height: 5' (152.4 cm) Weight: 102 lb 1.2 oz (46.3 kg) IBW/kg (Calculated) : 45.5   Vital Signs: Temp: 98.5 F (36.9 C) (10/18 0603) BP: 157/83 (10/18 0603) Pulse Rate: 97 (10/18 0603) Intake/Output from previous day: 10/17 0701 - 10/18 0700 In: 720 [P.O.:720] Out: -  Intake/Output from this shift: Total I/O In: 120 [P.O.:120] Out: -   Labs: Recent Labs    12/01/17 0533 12/02/17 0407 12/03/17 0853  WBC  --   --  7.3  HGB  --   --  8.7*  HCT  --   --  28.3*  PLT  --   --  346  CREATININE 4.69* 4.93* 5.51*  PHOS 3.9 4.7* 4.9*  ALBUMIN 2.0* 2.1* 2.1*   Estimated Creatinine Clearance: 7.5 mL/min (A) (by C-G formula based on SCr of 5.51 mg/dL (H)).  Assessment: 62 yo F recently initiated on dialysis started on phenytoin inpatient for breakthrough seizures. Neurology initially followed pt & wanted pharmacy to take over dosing for phenytoin. Pt's dose was reduced from 100mg  q8h to 125mg  q12h on 10/2.   Total phenytoin level today is 5 which corrects to 9.6 with low albumin in ESRD. No further seizures noted.     Monitoring Corrected phenytoin level = 10-35mcg/mL  Plan:  Increase phenytoin slightly to 150mg  q12h Monitor clinical status, seizures, phenytoin levels prn  Salome Arnt, PharmD, BCPS Please see AMION for all pharmacy numbers 12/03/2017 10:05 AM

## 2017-12-03 NOTE — Progress Notes (Signed)
Physical Therapy Session Note  Patient Details  Name: Erica [name] MRN: [id number] Date of Birth: [birth year]  Today's Date: 12/03/2017 PT Individual Time: 0906-1002 and 3825-0539 PT Individual Time Calculation (min): 56 min and 71 min   Short Term Goals: Week 1:  PT Short Term Goal 1 (Week 1): Pt will ambulate 75 ft with LRAD & supervision.  PT Short Term Goal 2 (Week 1): Pt will negotiate stairs with 1 rail and min assist for strengtening & balance.  Skilled Therapeutic Interventions/Progress Updates:  Treatment 1: Pt received in bed with RN present administering meds. Pt reports pain in RUE after having blood drawn & RN in room & aware of pt's report. Pt transfers supine<>sit with supervision and hospital bed features. Pt requests to get dressed & doffs paper scrubs sitting/standing EOB and dons socks, shoes, pants, and shirt with extra time and supervision overall for sitting & standing balance and sit<>stand transfers. Pt does require cuing for figure four position to increase ease of donning shoes, and assistance to loosen shoe laces for increased ease as well. Therapist provides cuing for hand placement on stable surface for sit<>stand transfers. Pt ambulates 60 ft + 100 ft + 160 ft with RW & close supervision with significantly decreased gait speed which pt reports is not her baseline. Pt requires rest break in chairs by elevator when ambulating to gym. In gym, therapist administered Berg Balance Test & pt scored 30/56; educated pt on interpretation of score & current fall risk. Patient demonstrates increased fall risk as noted by score of 30/56 on Berg Balance Scale.  (<36= high risk for falls, close to 100%; 37-45 significant >80%; 46-51 moderate >50%; 52-55 lower >25%). At end of session pt left in bed with alarm set & needs in reach.    Treatment 2: Pt received in bed & agreeable to tx. No c/o pain reported. Pt transferred to EOB & donned socks & shoes with distant supervision &  extra time. Pt ambulates in room & bathroom with RW & supervision. Pt transfers onto toilet with close supervision but requires mod assist and use of grab bar to transfer to standing from toilet. Pt with continent void on toilet, following incontinence in brief. Therapist provided assistance for donning clean brief & pt requires close supervision<>steady assist for standing balance while pulling pants over hips and for hand hygiene standing at sink. Pt ambulates throughout unit (room>dayroom>chairs by elevators>gym>room) with RW & supervision with significantly decreased gait speed. Pt utilized cybex kinetron in sitting, progressing to standing with BUE support & CGA assist for balance with task focusing on BLE strengthening & LLE NMR with therapist providing multimodal cuing for upright posture. Pt stood on compliant surface with wide BOS without UE support<>intermittent 1 UE support and close supervision while engaging in peg board activity with pt correctly assembling simple design without assistance. Progressed task to narrow BOS while assembling moderately complex design from choice of many with 1UE<>no UE support and CGA<>mod assist 2/2 1 LOB. Pt also utilized LUE for task for forced use for NMR as pt with impaired coordination of extremity. At end of session pt left supine in bed with alarm set & needs in reach, family in room. Pt with B edema in feet & RN made aware.    Therapy Documentation Precautions:  Precautions Precautions: Fall Precaution Comments: seizures Restrictions Weight Bearing Restrictions: No  Balance: Balance Balance Assessed: Yes Standardized Balance Assessment Standardized Balance Assessment: Berg Balance Test Berg Balance Test Sit to Stand: Able  to stand without using hands and stabilize independently Standing Unsupported: Able to stand 2 minutes with supervision Sitting with Back Unsupported but Feet Supported on Floor or Stool: Able to sit safely and securely 2  minutes Stand to Sit: Sits safely with minimal use of hands Transfers: Able to transfer safely, definite need of hands Standing Unsupported with Eyes Closed: Able to stand 10 seconds with supervision Standing Ubsupported with Feet Together: Able to place feet together independently and stand for 1 minute with supervision From Standing, Reach Forward with Outstretched Arm: Reaches forward but needs supervision From Standing Position, Pick up Object from Floor: Unable to pick up shoe, but reaches 2-5 cm (1-2") from shoe and balances independently From Standing Position, Turn to Look Behind Over each Shoulder: Needs supervision when turning Turn 360 Degrees: Needs assistance while turning Standing Unsupported, Alternately Place Feet on Step/Stool: Needs assistance to keep from falling or unable to try Standing Unsupported, One Foot in Front: Able to take small step independently and hold 30 seconds Standing on One Leg: Unable to try or needs assist to prevent fall Total Score: 30     Therapy/Group: Individual Therapy  [name] 12/03/2017, 3:40 PM

## 2017-12-03 NOTE — Plan of Care (Signed)
  Problem: Spiritual Needs Goal: Ability to function at adequate level Outcome: Progressing   Problem: Consults Goal: RH STROKE PATIENT EDUCATION Description See Patient Education module for education specifics  Outcome: Progressing Goal: Nutrition Consult-if indicated Outcome: Progressing Goal: Diabetes Guidelines if Diabetic/Glucose > 140 Description If diabetic or lab glucose is > 140 mg/dl - Initiate Diabetes/Hyperglycemia Guidelines & Document Interventions  Outcome: Progressing   Problem: RH BOWEL ELIMINATION Goal: RH STG MANAGE BOWEL WITH ASSISTANCE Description STG Manage Bowel with mod I Assistance.  Outcome: Progressing   Problem: RH BLADDER ELIMINATION Goal: RH STG MANAGE BLADDER WITH ASSISTANCE Description STG Manage Bladder With mod I Assistance  Outcome: Progressing   Problem: RH SKIN INTEGRITY Goal: RH STG SKIN FREE OF INFECTION/BREAKDOWN Description Patients skin will remain free from further infection or breakdown with mod I assist.  Outcome: Progressing Goal: RH STG MAINTAIN SKIN INTEGRITY WITH ASSISTANCE Description STG Maintain Skin Integrity With mod I Assistance.  Outcome: Progressing   Problem: RH SAFETY Goal: RH STG ADHERE TO SAFETY PRECAUTIONS W/ASSISTANCE/DEVICE Description STG Adhere to Safety Precautions With mod I Assistance/Device.  Outcome: Progressing   Problem: RH KNOWLEDGE DEFICIT Goal: RH STG INCREASE KNOWLEDGE OF DIABETES Description Min assist  Outcome: Progressing Goal: RH STG INCREASE KNOWLEDGE OF HYPERTENSION Description Min assist  Outcome: Progressing Goal: RH STG INCREASE KNOWLEGDE OF HYPERLIPIDEMIA Description Min assist  Outcome: Progressing Goal: RH STG INCREASE KNOWLEDGE OF STROKE PROPHYLAXIS Description Min assist  Outcome: Progressing

## 2017-12-03 NOTE — Progress Notes (Signed)
Subjective: Interval History: has no complaint .  Objective: Vital signs in last 24 hours: Temp:  [98.1 F (36.7 C)-98.6 F (37 C)] 98.5 F (36.9 C) (10/18 0603) Pulse Rate:  [91-102] 97 (10/18 0603) Resp:  [16-18] 16 (10/18 0603) BP: (157-160)/(83-90) 157/83 (10/18 0603) SpO2:  [96 %-100 %] 96 % (10/18 0603) Weight:  [46.3 kg] 46.3 kg (10/18 0603) Weight change:   Intake/Output from previous day: 10/17 0701 - 10/18 0700 In: 720 [P.O.:720] Out: -  Intake/Output this shift: Total I/O In: 120 [P.O.:120] Out: -   General appearance: alert, cooperative and no distress Resp: clear to auscultation bilaterally Chest wall: RIJ cath Cardio: S1, S2 normal and systolic murmur: systolic ejection 2/6, crescendo and decrescendo at 2nd left intercostal space GI: soft, non-tender; bowel sounds normal; no masses,  no organomegaly Extremities: extremities normal, atraumatic, no cyanosis or edema  Lab Results: No results for input(s): WBC, HGB, HCT, PLT in the last 72 hours. BMET:  Recent Labs    12/01/17 0533 12/02/17 0407  NA 131* 131*  K 4.4 4.7  CL 98 98  CO2 21* 21*  GLUCOSE 159* 75  BUN 42* 45*  CREATININE 4.69* 4.93*  CALCIUM 8.7* 8.8*   No results for input(s): PTH in the last 72 hours. Iron Studies: No results for input(s): IRON, TIBC, TRANSFERRIN, FERRITIN in the last 72 hours.  Studies/Results: No results found.  I have reviewed the patient's current medications.  Assessment/Plan: 1 AKI chem pending . Vol ok.  Day to day regarding HD. 2 HTN meds 3 UTI/Obstruction follow 4 Anemia follow 5 Debill P check chem, follow vol, sx, cont rehab    LOS: 4 days   Jeneen Rinks Imanni Burdine 12/03/2017,8:28 AM

## 2017-12-03 NOTE — Progress Notes (Signed)
[facility] PHYSICAL MEDICINE & REHABILITATION PROGRESS NOTE   Subjective/Complaints: Pt up in bed. No new complaints. Denies pain.   ROS: Patient denies fever, rash, sore throat, blurred vision, nausea, vomiting, diarrhea, cough, shortness of breath or chest pain, joint or back pain, headache, or mood change.   Objective:   No results found. Recent Labs    12/03/17 0853  WBC 7.3  HGB 8.7*  HCT 28.3*  PLT 346   Recent Labs    12/02/17 0407 12/03/17 0853  NA 131* 129*  K 4.7 4.8  CL 98 98  CO2 21* 20*  GLUCOSE 75 330*  BUN 45* 57*  CREATININE 4.93* 5.51*  CALCIUM 8.8* 8.6*    Intake/Output Summary (Last 24 hours) at 12/03/2017 1501 Last data filed at 12/03/2017 1300 Gross per 24 hour  Intake 600 ml  Output -  Net 600 ml     Physical Exam: Vital Signs Blood pressure (!) 161/82, pulse 91, temperature 97.8 F (36.6 C), resp. rate 18, height 5' (1.524 m), weight 46.3 kg, SpO2 99 %. Constitutional: No distress . Vital signs reviewed. HEENT: EOMI, oral membranes moist Neck: supple Cardiovascular: RRR without murmur. No JVD    Respiratory: CTA Bilaterally without wheezes or rales. Normal effort    GI: BS +, non-tender, non-distended  Musculoskeletal:  No edema or tenderness in extremities  Neurological: She is alert.  Speech fairly clear. Follows basic commands.  Motor: motor 4/5 throughout LUE ataxia Skin: Skin is warm and dry.  Psychiatric: cooperative and pleasant     Assessment/Plan: 1. 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  Physiatrist is providing close team supervision and 24 hour management of active medical problems listed below.  Physiatrist and rehab team continue to assess barriers to discharge/monitor patient progress toward functional and medical goals  Care Tool:  Bathing  Bathing activity did  not occur: Refused Body parts bathed by patient: Right arm, Left arm, Chest, Abdomen, Right upper leg, Face, Left upper leg         Bathing assist Assist Level: Supervision/Verbal cueing     Upper Body Dressing/Undressing Upper body dressing Upper body dressing/undressing activity did not occur (including orthotics): Refused What is the patient wearing?: Pull over shirt    Upper body assist Assist Level: Supervision/Verbal cueing    Lower Body Dressing/Undressing Lower body dressing    Lower body dressing activity did not occur: Refused What is the patient wearing?: Pants     Lower body assist Assist for lower body dressing: Supervision/Verbal cueing     Toileting Toileting    Toileting assist Assist for toileting: Minimal Assistance - Patient > 75%     Transfers Chair/bed transfer  Transfers assist     Chair/bed transfer assist level: Supervision/Verbal cueing     Locomotion Ambulation   Ambulation assist      Assist level: Supervision/Verbal cueing Assistive device: Walker-rolling Max distance: 160 ft    Walk 10 feet activity   Assist     Assist level: Supervision/Verbal cueing Assistive device: Walker-rolling   Walk 50 feet activity   Assist    Assist level: Supervision/Verbal cueing Assistive device: Walker-rolling    Walk 150 feet activity   Assist Walk 150 feet activity did not occur: Safety/medical concerns  Assist level: Supervision/Verbal cueing Assistive device: Walker-rolling    Walk 10 feet on uneven surface  activity   Assist  Assist level: Moderate Assistance - Patient - 50 - 74% Assistive device: Hand held assist   Wheelchair     Assist Will patient use wheelchair at discharge?: No   Wheelchair activity did not occur: Safety/medical concerns         Wheelchair 50 feet with 2 turns activity    Assist    Wheelchair 50 feet with 2 turns activity did not occur: Safety/medical concerns        Wheelchair 150 feet activity     Assist Wheelchair 150 feet activity did not occur: Safety/medical concerns        Medical Problem List and Plan: 1.  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    ---Continue CIR therapies including PT, OT, and SLP   2.  DVT Prophylaxis/Anticoagulation: Pharmaceutical: Lovenox 3. Pain Management: tylenol prn.  4. Mood: LCSW to follow for evaluation and support.  5. Neuropsych: This patient is not fully capable of making decisions on her own behalf. 6. Skin/Wound Care: Routine pressure relief measures measures.  - added prn benadryl at night to help with itching 7. Fluids/Electrolytes/Nutrition: Strict I's and O's.   - Labs with dialysis. 8.  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    -Neurology recommended to increase Topamax to 50 mg twice daily (on 25mg  currently)---no change at present 9.  History of hydronephrosis s/p stents and recurrent E. coli UTI: Urology with recommendations to continue antibiotics. Ongoing temps  -UA+   -ucx multispecies  -3 day course of keflex completed 10.  T2DM: Intake improving with improvement in mentation.  Low-dose Lantus resumed  .  -Increased Lantus insulin to 10 units daily ln 10/16, increase to 14units tomorrow morning  -SSI coverage   CBG (last 3)  Recent Labs    12/02/17 2132 12/03/17 0645 12/03/17 1221  GLUCAP 271* 289* 244*    11.  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  Anemia of chronic disease: Started on 12 load Fe.  Continue Aranesp.     LOS: 4 days A FACE TO FACE EVALUATION WAS PERFORMED  Meredith Staggers 12/03/2017, 3:01 PM

## 2017-12-03 NOTE — Progress Notes (Signed)
Occupational Therapy Session Note  Patient Details  Name: Erica [name] MRN: [id number] Date of Birth: [birth year]  Today's Date: 12/03/2017 OT Individual Time: 1030-1130 OT Individual Time Calculation (min): 60 min    Short Term Goals: Week 1:  OT Short Term Goal 1 (Week 1): Pt will complete LB dressing with supervision OT Short Term Goal 2 (Week 1): Pt will complete bathing with supervision at sit > stand level OT Short Term Goal 3 (Week 1): Pt will complete toilet transfers ambulating with LRAD OT Short Term Goal 4 (Week 1): Pt will complete 2 grooming tasks in standing to increase activity tolerance   Skilled Therapeutic Interventions/Progress Updates:    Pt resting in bed upon arrival.  Pt declined bathing and changing clothing this morning but agreeable to participating in therapy.  OT intervention with focus on functional amb with RW, standing balance, cognitive remediation, and activity tolerance to increase independence with BADLs. Pt amb with RW to day room and engaged in assembling 24 piece puzzle.  Pt required more than a reasonable amount of time and mod verbal cues to complete task in 40 mins. Pt able to stand during session without rest breaks while completing puzzle.  Pt with difficulty picking up puzzle pieces and orienting appropriately when placing in puzzle.  Pt required max verbal cues to path finding back to room.  Pt remained in bed with all needs within reach and bed alarm activated.   Therapy Documentation Precautions:  Precautions Precautions: Fall Precaution Comments: seizures Restrictions Weight Bearing Restrictions: No  Pain:  Pt denies pain  Therapy/Group: Individual Therapy  Leroy Libman 12/03/2017, 12:05 PM

## 2017-12-04 ENCOUNTER — Inpatient Hospital Stay (HOSPITAL_COMMUNITY): Payer: Medicaid Other

## 2017-12-04 ENCOUNTER — Inpatient Hospital Stay (HOSPITAL_COMMUNITY): Payer: Medicaid Other | Admitting: Physical Therapy

## 2017-12-04 LAB — RENAL FUNCTION PANEL
ALBUMIN: 2 g/dL — AB (ref 3.5–5.0)
ALBUMIN: 2.1 g/dL — AB (ref 3.5–5.0)
ANION GAP: 9 (ref 5–15)
Anion gap: 13 (ref 5–15)
BUN: 68 mg/dL — AB (ref 8–23)
BUN: 70 mg/dL — ABNORMAL HIGH (ref 8–23)
CALCIUM: 8.4 mg/dL — AB (ref 8.9–10.3)
CALCIUM: 8.6 mg/dL — AB (ref 8.9–10.3)
CO2: 18 mmol/L — ABNORMAL LOW (ref 22–32)
CO2: 20 mmol/L — ABNORMAL LOW (ref 22–32)
CREATININE: 5.94 mg/dL — AB (ref 0.44–1.00)
Chloride: 98 mmol/L (ref 98–111)
Chloride: 100 mmol/L (ref 98–111)
Creatinine, Ser: 5.94 mg/dL — ABNORMAL HIGH (ref 0.44–1.00)
GFR calc Af Amer: 8 mL/min — ABNORMAL LOW (ref >60)
GFR calc Af Amer: 8 mL/min — ABNORMAL LOW (ref >60)
GFR calc non Af Amer: 7 mL/min — ABNORMAL LOW (ref >60)
GFR, EST NON AFRICAN AMERICAN: 7 mL/min — AB (ref >60)
GLUCOSE: 172 mg/dL — AB (ref 70–99)
Glucose, Bld: 288 mg/dL — ABNORMAL HIGH (ref 70–99)
PHOSPHORUS: 5.3 mg/dL — AB (ref 2.5–4.6)
PHOSPHORUS: 5.2 mg/dL — AB (ref 2.5–4.6)
POTASSIUM: 5.3 mmol/L — AB (ref 3.5–5.1)
Potassium: 4.1 mmol/L (ref 3.5–5.1)
SODIUM: 129 mmol/L — AB (ref 135–145)
SODIUM: 129 mmol/L — AB (ref 135–145)

## 2017-12-04 LAB — GLUCOSE, CAPILLARY
GLUCOSE-CAPILLARY: 70 mg/dL (ref 70–99)
GLUCOSE-CAPILLARY: 109 mg/dL — AB (ref 70–99)
GLUCOSE-CAPILLARY: 166 mg/dL — AB (ref 70–99)
Glucose-Capillary: 269 mg/dL — ABNORMAL HIGH (ref 70–99)
Glucose-Capillary: 256 mg/dL — ABNORMAL HIGH (ref 70–99)
Glucose-Capillary: 50 mg/dL — ABNORMAL LOW (ref 70–99)
Glucose-Capillary: 83 mg/dL (ref 70–99)

## 2017-12-04 LAB — CBC
HCT: 29.1 % — ABNORMAL LOW (ref 36.0–46.0)
Hemoglobin: 8.7 g/dL — ABNORMAL LOW (ref 12.0–15.0)
MCH: 28.7 pg (ref 26.0–34.0)
MCHC: 29.9 g/dL — ABNORMAL LOW (ref 30.0–36.0)
MCV: 96 fL (ref 80.0–100.0)
Platelets: 359 10*3/uL (ref 150–400)
RBC: 3.03 MIL/uL — AB (ref 3.87–5.11)
RDW: 21.9 % — ABNORMAL HIGH (ref 11.5–15.5)
WBC: 9.1 10*3/uL (ref 4.0–10.5)
nRBC: 0.4 % — ABNORMAL HIGH (ref 0.0–0.2)

## 2017-12-04 MED ORDER — CHLORHEXIDINE GLUCONATE CLOTH 2 % EX PADS
6.0000 | MEDICATED_PAD | Freq: Every day | CUTANEOUS | Status: DC
  Administered 2017-12-04: 6 via TOPICAL

## 2017-12-04 MED ORDER — SODIUM CHLORIDE 0.9 % IV SOLN: 100.0000 mL | INTRAVENOUS | Status: DC | PRN

## 2017-12-04 MED ORDER — LIDOCAINE HCL (PF) 1 % IJ SOLN: 5.0000 mL | INTRAMUSCULAR | Status: DC | PRN

## 2017-12-04 MED ORDER — HEPARIN SODIUM (PORCINE) 1000 UNIT/ML IJ SOLN
INTRAMUSCULAR | Status: AC
  Administered 2017-12-04: 19:00:00
  Filled 2017-12-04: qty 1

## 2017-12-04 MED ORDER — HEPARIN SODIUM (PORCINE) 1000 UNIT/ML DIALYSIS: 1000.0000 [IU] | INTRAMUSCULAR | Status: DC | PRN

## 2017-12-04 MED ORDER — HEPARIN SODIUM (PORCINE) 1000 UNIT/ML DIALYSIS
40.0000 [IU]/kg | Freq: Once | INTRAMUSCULAR | Status: AC
  Administered 2017-12-04: 1000 [IU] via INTRAVENOUS_CENTRAL

## 2017-12-04 MED ORDER — INSULIN ASPART 100 UNIT/ML ~~LOC~~ SOLN: 0.0000 [IU] | Freq: Every day | SUBCUTANEOUS | Status: DC

## 2017-12-04 MED ORDER — ALTEPLASE 2 MG IJ SOLR: 2.0000 mg | Freq: Once | INTRAMUSCULAR | Status: DC | PRN

## 2017-12-04 MED ORDER — PENTAFLUOROPROP-TETRAFLUOROETH EX AERO: 1.0000 "application " | INHALATION_SPRAY | CUTANEOUS | Status: DC | PRN

## 2017-12-04 MED ORDER — LEVETIRACETAM 500 MG PO TABS
500.0000 mg | ORAL_TABLET | ORAL | Status: DC
Start: 2017-12-04 — End: 2017-12-07
  Administered 2017-12-04: 500 mg via ORAL
  Filled 2017-12-04 (×2): qty 1

## 2017-12-04 MED ORDER — INSULIN ASPART 100 UNIT/ML ~~LOC~~ SOLN
0.0000 [IU] | Freq: Three times a day (TID) | SUBCUTANEOUS | Status: DC
  Administered 2017-12-05 (×2): 3 [IU] via SUBCUTANEOUS

## 2017-12-04 MED ORDER — GLUCOSE 40 % PO GEL
ORAL | Status: AC
  Administered 2017-12-04: 37.5 g
  Filled 2017-12-04: qty 1

## 2017-12-04 MED ORDER — INSULIN GLARGINE 100 UNIT/ML ~~LOC~~ SOLN
17.0000 [IU] | Freq: Every day | SUBCUTANEOUS | Status: DC
  Filled 2017-12-04: qty 0.17

## 2017-12-04 MED ORDER — LIDOCAINE-PRILOCAINE 2.5-2.5 % EX CREA: 1.0000 "application " | TOPICAL_CREAM | CUTANEOUS | Status: DC | PRN

## 2017-12-04 NOTE — Progress Notes (Signed)
Subjective: Interval History: c/o weakness..  Objective: Vital signs in last 24 hours: Temp:  [97.8 F (36.6 C)-98.5 F (36.9 C)] 98.3 F (36.8 C) (10/19 0620) Pulse Rate:  [91-100] 98 (10/19 0620) Resp:  [16-18] 18 (10/19 0620) BP: (142-176)/(75-89) 142/75 (10/19 0620) SpO2:  [95 %-99 %] 95 % (10/19 0620) Weight:  [47.5 kg] 47.5 kg (10/19 0500) Weight change: 1.237 kg  Intake/Output from previous day: 10/18 0701 - 10/19 0700 In: 600 [P.O.:600] Out: 0  Intake/Output this shift: No intake/output data recorded.  General appearance: alert, cooperative and no distress Resp: rales RLL Chest wall: RIJ PC Cardio: S1, S2 normal and systolic murmur: systolic ejection 2/6, crescendo and decrescendo at 2nd left intercostal space GI: soft, non-tender; bowel sounds normal; no masses,  no organomegaly Extremities: edema 1+  Lab Results: Recent Labs    12/03/17 0853  WBC 7.3  HGB 8.7*  HCT 28.3*  PLT 346   BMET:  Recent Labs    12/02/17 0407 12/03/17 0853  NA 131* 129*  K 4.7 4.8  CL 98 98  CO2 21* 20*  GLUCOSE 75 330*  BUN 45* 57*  CREATININE 4.93* 5.51*  CALCIUM 8.8* 8.6*   No results for input(s): PTH in the last 72 hours. Iron Studies: No results for input(s): IRON, TIBC, TRANSFERRIN, FERRITIN in the last 72 hours.  Studies/Results: No results found.  I have reviewed the patient's current medications.  Assessment/Plan: 1 AKI  Cr rose yest, not back yet, mild acidemia.  If Cr ^ , will do HD today.  Vol ok.  2 Anemia esa/Fe 3 DM 4 Obstructive uropathy 5 Debill on Rehab P HD, esa, check Cr    LOS: 5 days   Erica [name] 12/04/2017,8:25 AM

## 2017-12-04 NOTE — Progress Notes (Signed)
Physical Therapy Session Note  Patient Details  Name: Erica [name] MRN: [id number] Date of Birth: May 30, 1955  Today's Date: 12/04/2017 PT Individual Time: 0800-0915 PT Individual Time Calculation (min): 75 min   Short Term Goals: Week 1:  PT Short Term Goal 1 (Week 1): Pt will ambulate 75 ft with LRAD & supervision.  PT Short Term Goal 2 (Week 1): Pt will negotiate stairs with 1 rail and min assist for strengtening & balance.  Skilled Therapeutic Interventions/Progress Updates:    Pt received seated in bed finishing breakfast, agreeable to PT. Pt reports feeling nauseous this AM and that she didn't sleep well due to multiple attempts to have an IV put in. No complaints of pain. Bed mobility with Supervision. Sit to stand with min A to RW throughout therapy session with v/c for safe transfer technique and anterior weight shift. Ambulation x 100 ft, x 160 ft with RW and Supervision. Pt ambulates with decreased gait speed and occasional ataxia of LLE. Backwards amb 2 x 10 ft with RW and CGA for balance. Standing alt L/R 6" step taps with BUE support and min A for balance. Side-steps 2 x 30 ft L/R with BUE support and CGA for balance. Pt requests to lay back down at end of session. Bed mobility with Supervision. Pt left supine in bed with needs in reach and bed alarm in place.  Therapy Documentation Precautions:  Precautions Precautions: Fall Precaution Comments: seizures Restrictions Weight Bearing Restrictions: No   Therapy/Group: Individual Therapy  Excell Seltzer, PT, DPT  12/04/2017, 12:08 PM

## 2017-12-04 NOTE — Progress Notes (Signed)
Occupational Therapy Note  Patient Details  Name: RETINA BERNARDY MRN: [id number] Date of Birth: 23-Apr-1955  Today's Date: 12/04/2017 OT Missed Time: 29 Minutes Missed Time Reason: Unavailable (comment)(in HD)  Pt missed 60 min skilled OT d/t unavailable off the unit at HD. Will follow up as available per POC.   Lowella Dell Saadiq Poche 12/04/2017, 4:19 PM

## 2017-12-04 NOTE — Significant Event (Signed)
Hypoglycemic Event  CBG: 70  Treatment: 15 GM carbohydrate snack  Symptoms: Pale and Shaky  Follow-up CBG: Time:1920 CBG Result  Possible Reasons for Event: Other: returned from dialysis  Comments/MD notified:    Lynnea Maizes Jonetta Dagley

## 2017-12-04 NOTE — Progress Notes (Signed)
Erica [name] PHYSICAL MEDICINE & REHABILITATION PROGRESS NOTE   Subjective/Complaints: Seen in therapy No complaints Feels well  Objective:   BP (!) 142/75 (BP Location: Left Arm)   Pulse 98   Temp 98.3 F (36.8 C)   Resp 18   Ht 5' (1.524 m)   Wt 47.5 kg   SpO2 95%   BMI 20.47 kg/m   Well-developed well-nourished female in no acute distress. HEENT exam atraumatic, normocephalic, extraocular muscles are intact. Neck is supple. No jugular venous distention no thyromegaly. Chest clear to auscultation without increased work of breathing. Cardiac exam S1 and S2 are regular. Abdominal exam active bowel sounds, soft, nontender. Extremities no edema. Neurologic exam she is alert Constitutional: No distress .       Medical Problem List and Plan: 1.  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    ---Continue CIR therapies including PT, OT, and SLP   2.  DVT Prophylaxis/Anticoagulation: Pharmaceutical: Lovenox 3. Pain Management: tylenol prn.  4. Mood: LCSW to follow for evaluation and support.  5. Neuropsych: This patient is not fully capable of making decisions on her own behalf. 6. Skin/Wound Care: Routine pressure relief measures measures.  - added prn benadryl at night to help with itching 7. Fluids/Electrolytes/Nutrition: Strict I's and O's.   - Labs with dialysis. 8.  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    -Neurology recommended to increase Topamax to 50 mg twice daily (on 25mg  currently)---no change at present 9.  History of hydronephrosis s/p stents and recurrent E. coli UTI:  -3 day course of keflex completed 10.  T2DM: Intake improving with improvement in mentation.  poor control Adjust insulin   CBG (last 3)   Recent Labs    12/03/17 2102 12/04/17 0632 12/04/17 1152  GLUCAP 190* 269* 256*    11.  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  Anemia of chronic disease: Started on 12 load Fe.  Continue Aranesp.     LOS: 5 days A FACE TO FACE EVALUATION WAS PERFORMED  Erica [name] Erica [name] 12/04/2017, 12:53 PM

## 2017-12-04 NOTE — Progress Notes (Signed)
Occupational Therapy Session Note  Patient Details  Name: Erica [name] MRN: [id number] Date of Birth: May 16, 1955  Today's Date: 12/04/2017 OT Individual Time: 2060-1561 OT Individual Time Calculation (min): 55 min    Short Term Goals: Week 1:  OT Short Term Goal 1 (Week 1): Pt will complete LB dressing with supervision OT Short Term Goal 2 (Week 1): Pt will complete bathing with supervision at sit > stand level OT Short Term Goal 3 (Week 1): Pt will complete toilet transfers ambulating with LRAD OT Short Term Goal 4 (Week 1): Pt will complete 2 grooming tasks in standing to increase activity tolerance   Skilled Therapeutic Interventions/Progress Updates:    1;1. Pt received asleep easily aroused with no c/o pain. Pt completes all transfers at ambulatory level with CGA-supervision and RW and significantly decreased gait speed. tp completes bathing at shower level sit to stand level with VC for sequencing bathing body parts. Rn approved shower with HD post covered. Pt completes dressing sit to stand with RW at EOB with increased time to thread BLE into pants/underwear and supervision, Pt dresses UB with set up. Pt dons footwear with increased time and VC for donning socks and shoes in seated figure 4 for energy conservation/decrease challenge of sitting balance. Pt able to fasten  Exited session with pt seated in bed, call light in reach and exit alarm on.  Therapy Documentation Precautions:  Precautions Precautions: Fall Precaution Comments: seizures Restrictions Weight Bearing Restrictions: No General:   Vital Signs:  Pain:   ADL: ADL Eating: Set up Grooming: Setup, Minimal assistance Where Assessed-Grooming: Standing at sink Upper Body Bathing: Minimal cueing, Minimal assistance Where Assessed-Upper Body Bathing: Sitting at sink Lower Body Bathing: Minimal cueing, Minimal assistance Where Assessed-Lower Body Bathing: (sit > stand at sink) Upper Body Dressing:  Setup Where Assessed-Upper Body Dressing: Sitting at sink Lower Body Dressing: Minimal assistance Where Assessed-Lower Body Dressing: (sit > stand at sink) Toilet Transfer: Minimal assistance Toilet Transfer Method: Arts development officer: Art gallery manager    Praxis   Exercises:   Other Treatments:     Therapy/Group: Individual Therapy  Tonny Branch 12/04/2017, 10:32 AM

## 2017-12-04 NOTE — Significant Event (Signed)
Hypoglycemic Event  CBG: 50 Treatment: 15 GM gel  Symptoms: Shaky  Follow-up CBG: Time:1928 CBG Result:83  Possible Reasons for Event: Unknown  Comments/MD notified    Erica [name] [name]

## 2017-12-05 ENCOUNTER — Inpatient Hospital Stay (HOSPITAL_COMMUNITY): Payer: Medicaid Other

## 2017-12-05 DIAGNOSIS — I120 Hypertensive chronic kidney disease with stage 5 chronic kidney disease or end stage renal disease: Secondary | ICD-10-CM

## 2017-12-05 DIAGNOSIS — N185 Chronic kidney disease, stage 5: Secondary | ICD-10-CM

## 2017-12-05 DIAGNOSIS — E1122 Type 2 diabetes mellitus with diabetic chronic kidney disease: Secondary | ICD-10-CM

## 2017-12-05 LAB — RENAL FUNCTION PANEL
ALBUMIN: 2 g/dL — AB (ref 3.5–5.0)
Anion gap: 13 (ref 5–15)
BUN: 23 mg/dL (ref 8–23)
CALCIUM: 8.3 mg/dL — AB (ref 8.9–10.3)
CO2: 25 mmol/L (ref 22–32)
Chloride: 96 mmol/L — ABNORMAL LOW (ref 98–111)
Creatinine, Ser: 3.27 mg/dL — ABNORMAL HIGH (ref 0.44–1.00)
GFR calc Af Amer: 16 mL/min — ABNORMAL LOW (ref >60)
GFR calc non Af Amer: 14 mL/min — ABNORMAL LOW (ref >60)
GLUCOSE: 173 mg/dL — AB (ref 70–99)
Phosphorus: 3.7 mg/dL (ref 2.5–4.6)
Potassium: 3.4 mmol/L — ABNORMAL LOW (ref 3.5–5.1)
SODIUM: 134 mmol/L — AB (ref 135–145)

## 2017-12-05 LAB — GLUCOSE, CAPILLARY
GLUCOSE-CAPILLARY: 157 mg/dL — AB (ref 70–99)
Glucose-Capillary: 187 mg/dL — ABNORMAL HIGH (ref 70–99)
Glucose-Capillary: 160 mg/dL — ABNORMAL HIGH (ref 70–99)
Glucose-Capillary: 209 mg/dL — ABNORMAL HIGH (ref 70–99)

## 2017-12-05 MED ORDER — INSULIN GLARGINE 100 UNIT/ML ~~LOC~~ SOLN
10.0000 [IU] | Freq: Every day | SUBCUTANEOUS | Status: DC
  Administered 2017-12-05 – 2017-12-12 (×8): 10 [IU] via SUBCUTANEOUS
  Filled 2017-12-05 (×8): qty 0.1

## 2017-12-05 NOTE — Progress Notes (Signed)
[facility] PHYSICAL MEDICINE & REHABILITATION PROGRESS NOTE   Subjective/Complaints: Patient feeling well this morning.  She sitting up in bed eating.  Events of yesterday noted.  She had dialysis yesterday.  She had a hypoglycemic episode yesterday.  Objective:   BP (!) 151/69 (BP Location: Left Arm)   Pulse 99   Temp 98.9 F (37.2 C)   Resp 20   Ht 5' (1.524 m)   Wt 45.7 kg   SpO2 98%   BMI 19.68 kg/m   Elderly, frail female in no acute distress.  HEENT exam atraumatic, normocephalic Neck supple Chest clear to auscultation Cardiac exam S1-S2 are regular with 2/6 holosystolic murmur at the apex Abdominal exam active bowel sounds, soft, thin Extremities without edema.  Medical Problem List and Plan: 1.  Decreased processing, cognitive deficits with delayed processing and ataxia.  All secondary to punctate foci of acute infarct in the deep white matter of both frontal and parietal vertices.  Patient  ---Continue CIR therapies including PT, OT, and SLP   2.  DVT Prophylaxis/Anticoagulation: Pharmaceutical: Lovenox 3. Pain Management: tylenol prn.  4. Mood: LCSW to follow for evaluation and support.  5. Neuropsych: This patient is not fully capable of making decisions on her own behalf. 6. Skin/Wound Care: Routine pressure relief measures measures.  - added prn benadryl at night to help with itching 7. Fluids/Electrolytes/Nutrition: Strict I's and O's.   - Labs with dialysis. 8.  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 9.  History of hydronephrosis s/p stents and recurrent E. coli UTI: She has completed a course of cephalexin. 10.  T2DM: Intake improving with improvement in mentation.  She had significant hypoglycemic episode yesterday.  Outside of that her blood sugars have been adequately controlled.  Will decrease Lantus and continue mealtime insulin.  CBG (last 3)  Recent Labs    12/04/17 1953 12/04/17 2116  12/05/17 0630  GLUCAP 109* 166* 157*    11.  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  Anemia of chronic disease: Started on 12 load Fe.  Continue Aranesp.     LOS: 6 days A FACE TO FACE EVALUATION WAS PERFORMED  Shelva Hetzer H Alzora Ha 12/05/2017, 7:40 AM

## 2017-12-05 NOTE — Progress Notes (Signed)
Subjective: Interval History: has no complaint, did well on HD, making urine.  Objective: Vital signs in last 24 hours: Temp:  [97.9 F (36.6 C)-98.9 F (37.2 C)] 98.9 F (37.2 C) (10/20 0410) Pulse Rate:  [89-101] 99 (10/20 0410) Resp:  [17-20] 20 (10/20 0410) BP: (145-170)/(56-91) 151/69 (10/20 0410) SpO2:  [96 %-99 %] 98 % (10/20 0410) Weight:  [45.5 kg-48.6 kg] 45.7 kg (10/20 0429) Weight change: 1.063 kg  Intake/Output from previous day: 10/19 0701 - 10/20 0700 In: 0  Out: 3000  Intake/Output this shift: No intake/output data recorded.  General appearance: alert, cooperative and no distress Resp: clear to auscultation bilaterally Chest wall: R IJ PC Cardio: S1, S2 normal and systolic murmur: systolic ejection 2/6, crescendo and decrescendo at 2nd left intercostal space GI: liver down 4 cm, pos bs, soft Extremities: extremities normal, atraumatic, no cyanosis or edema  Lab Results: Recent Labs    12/03/17 0853 12/04/17 1417  WBC 7.3 9.1  HGB 8.7* 8.7*  HCT 28.3* 29.1*  PLT 346 359   BMET:  Recent Labs    12/04/17 1417 12/05/17 0613  NA 129* 134*  K 4.1 3.4*  CL 100 96*  CO2 20* 25  GLUCOSE 172* 173*  BUN 70* 23  CREATININE 5.94* 3.27*  CALCIUM 8.6* 8.3*   No results for input(s): PTH in the last 72 hours. Iron Studies: No results for input(s): IRON, TIBC, TRANSFERRIN, FERRITIN in the last 72 hours.  Studies/Results: No results found.  I have reviewed the patient's current medications.  Assessment/Plan: 1 AKI had HD yest. Some function , not independent of HD yet but infreq.   ATN vs AIN , should recover. 2 Anemia esa/Fe 3 HTN fair control 4 DM 5 Debill per rehab 6 malnutrition P Rehab, follow chem , vol,     LOS: 6 days   Jeneen Rinks Verlia Kaney 12/05/2017,7:38 AM

## 2017-12-05 NOTE — Progress Notes (Signed)
Occupational Therapy Session Note  Patient Details  Name: Erica [name] MRN: [id number] Date of Birth: 28-Jul-1955  Today's Date: 12/05/2017 OT Individual Time: 1000-1056 OT Individual Time Calculation (min): 56 min    Short Term Goals: Week 1:  OT Short Term Goal 1 (Week 1): Pt will complete LB dressing with supervision OT Short Term Goal 2 (Week 1): Pt will complete bathing with supervision at sit > stand level OT Short Term Goal 3 (Week 1): Pt will complete toilet transfers ambulating with LRAD OT Short Term Goal 4 (Week 1): Pt will complete 2 grooming tasks in standing to increase activity tolerance   Skilled Therapeutic Interventions/Progress Updates:    1;1. Pt recived seated EOB with RN about to escort to bathroom. Handoff to OT and pt ambulates with RW and supervision to toilet and completes clothing management with supervision. Pt reports already washing up with RN/NT and just wanting to get dressed. Pt dresses sit to stand level at sink to don pants, pull up, socks and shoes. Pt able to recall strategy of crossing to seated figure 4 for donning footwear without VC. Pt requires VC for hand placement for safety. Pt dons pull over shirt seated with set up. Pt requesting to work on Administrator, Civil Service. Pt ambulates with RW to/from dayroom with increased time and VC for relaxing shoulders and increases stride cadence. Pt practices printing/signing name on unligned, wide rule and college rule paper with VC for making letters larger and space between letters bigger for improved legibility. Pt works on exercises for handwriting smoothness on lined paper making consecutive circles/lines without lifting pen. Exited session with pt setaed in recliner, belt alarm on and call light in reach  Therapy Documentation Precautions:  Precautions Precautions: Fall Precaution Comments: seizures Restrictions Weight Bearing Restrictions: No General:   Vital Signs:  Pain: Pain Assessment Pain Scale:  0-10 Pain Score: 0-No pain ADL: ADL Eating: Set up Grooming: Setup, Minimal assistance Where Assessed-Grooming: Standing at sink Upper Body Bathing: Minimal cueing, Minimal assistance Where Assessed-Upper Body Bathing: Sitting at sink Lower Body Bathing: Minimal cueing, Minimal assistance Where Assessed-Lower Body Bathing: (sit > stand at sink) Upper Body Dressing: Setup Where Assessed-Upper Body Dressing: Sitting at sink Lower Body Dressing: Minimal assistance Where Assessed-Lower Body Dressing: (sit > stand at sink) Toilet Transfer: Minimal assistance Toilet Transfer Method: Arts development officer: Art gallery manager    Praxis   Exercises:   Other Treatments:     Therapy/Group: Individual Therapy  Tonny Branch 12/05/2017, 10:04 AM

## 2017-12-05 NOTE — Progress Notes (Signed)
Last night at time of shift change, patients son called concerned that a family member called him alerting him that the patient had a low blood sugar reading. At the time of his call, the previous nurse had just finished interventions for the hypoglycemic event & the patients blood sugar was back witin normal limits. The son stated that he would like her blood sugars to stay between 100 & 250. He was informed of the interventions & the last blood sugar result of 109. During the shift, patient was alert & responsive. HS blood sugar was 166. Meal coverage  Insulin was placed on this shift but was not given due to event & <50% of her meal being eaten. Patient was in hemodialysis today & had not had her dinner. She arrived at the end of the previous shift when her blood sugar was taken & was noted to be low. A meal tray was obtained after giving her the immediate interventions on the protocol.She remained alert & responsive. She was a little weak & shaky when taken to the restroom, but at that time, blood sugar was just taken. She was given her HS snack & stayed up late watching TV. No acute distress noted at this time. Will continue to monitor for changes.

## 2017-12-05 NOTE — Progress Notes (Signed)
Occupational Therapy Note  Patient Details  Name: Erica [name] MRN: [id number] Date of Birth: May 21, 1955  Attempted to see pt for make up time missed time, however pt declined d/t family visiting. Lowella Dell Iyad Deroo 12/05/2017, 4:29 PM

## 2017-12-06 ENCOUNTER — Inpatient Hospital Stay (HOSPITAL_COMMUNITY): Payer: Medicaid Other | Admitting: Occupational Therapy

## 2017-12-06 ENCOUNTER — Inpatient Hospital Stay (HOSPITAL_COMMUNITY): Payer: Medicaid Other | Admitting: Physical Therapy

## 2017-12-06 DIAGNOSIS — I69319 Unspecified symptoms and signs involving cognitive functions following cerebral infarction: Secondary | ICD-10-CM

## 2017-12-06 DIAGNOSIS — I6300 Cerebral infarction due to thrombosis of unspecified precerebral artery: Secondary | ICD-10-CM

## 2017-12-06 LAB — RENAL FUNCTION PANEL
ANION GAP: 11 (ref 5–15)
Albumin: 2.2 g/dL — ABNORMAL LOW (ref 3.5–5.0)
BUN: 38 mg/dL — ABNORMAL HIGH (ref 8–23)
CHLORIDE: 98 mmol/L (ref 98–111)
CO2: 23 mmol/L (ref 22–32)
Calcium: 8.5 mg/dL — ABNORMAL LOW (ref 8.9–10.3)
Creatinine, Ser: 4.37 mg/dL — ABNORMAL HIGH (ref 0.44–1.00)
GFR calc Af Amer: 11 mL/min — ABNORMAL LOW (ref >60)
GFR, EST NON AFRICAN AMERICAN: 10 mL/min — AB (ref >60)
GLUCOSE: 112 mg/dL — AB (ref 70–99)
POTASSIUM: 3.7 mmol/L (ref 3.5–5.1)
Phosphorus: 5.6 mg/dL — ABNORMAL HIGH (ref 2.5–4.6)
Sodium: 132 mmol/L — ABNORMAL LOW (ref 135–145)

## 2017-12-06 LAB — GLUCOSE, CAPILLARY
GLUCOSE-CAPILLARY: 107 mg/dL — AB (ref 70–99)
GLUCOSE-CAPILLARY: 159 mg/dL — AB (ref 70–99)
GLUCOSE-CAPILLARY: 93 mg/dL (ref 70–99)
Glucose-Capillary: 220 mg/dL — ABNORMAL HIGH (ref 70–99)

## 2017-12-06 LAB — PARATHYROID HORMONE, INTACT (NO CA): PTH: 237 pg/mL — ABNORMAL HIGH (ref 15–65)

## 2017-12-06 MED ORDER — CARVEDILOL 6.25 MG PO TABS
6.2500 mg | ORAL_TABLET | Freq: Two times a day (BID) | ORAL | Status: DC
  Administered 2017-12-06 – 2017-12-09 (×6): 6.25 mg via ORAL
  Filled 2017-12-06 (×6): qty 1

## 2017-12-06 MED ORDER — INSULIN ASPART 100 UNIT/ML ~~LOC~~ SOLN
0.0000 [IU] | Freq: Every day | SUBCUTANEOUS | Status: DC
  Administered 2017-12-11: 3 [IU] via SUBCUTANEOUS
  Administered 2017-12-12 – 2017-12-13 (×2): 2 [IU] via SUBCUTANEOUS

## 2017-12-06 MED ORDER — INSULIN ASPART 100 UNIT/ML ~~LOC~~ SOLN
0.0000 [IU] | Freq: Three times a day (TID) | SUBCUTANEOUS | Status: DC
  Administered 2017-12-06: 2 [IU] via SUBCUTANEOUS
  Administered 2017-12-06: 3 [IU] via SUBCUTANEOUS
  Administered 2017-12-07 – 2017-12-08 (×2): 2 [IU] via SUBCUTANEOUS
  Administered 2017-12-09: 5 [IU] via SUBCUTANEOUS
  Administered 2017-12-09: 1 [IU] via SUBCUTANEOUS
  Administered 2017-12-10: 3 [IU] via SUBCUTANEOUS
  Administered 2017-12-11: 7 [IU] via SUBCUTANEOUS
  Administered 2017-12-11 – 2017-12-12 (×2): 2 [IU] via SUBCUTANEOUS
  Administered 2017-12-12: 3 [IU] via SUBCUTANEOUS
  Administered 2017-12-13: 1 [IU] via SUBCUTANEOUS
  Administered 2017-12-13: 2 [IU] via SUBCUTANEOUS
  Administered 2017-12-13: 3 [IU] via SUBCUTANEOUS
  Administered 2017-12-14 (×2): 2 [IU] via SUBCUTANEOUS
  Administered 2017-12-15: 5 [IU] via SUBCUTANEOUS
  Administered 2017-12-16: 2 [IU] via SUBCUTANEOUS

## 2017-12-06 NOTE — Progress Notes (Signed)
[facility] PHYSICAL MEDICINE & REHABILITATION PROGRESS NOTE   Subjective/Complaints: Tired , awakens to physical stim but not voice, pt states she slept poorly Oriented to hospital (but states [facility]) and Seizure  , but not CVA   ROS: Patient denies  nausea, vomiting, diarrhea, cough, shortness of breath or chest pain, joint or back pain,   Objective:   No results found. Recent Labs    12/03/17 0853 12/04/17 1417  WBC 7.3 9.1  HGB 8.7* 8.7*  HCT 28.3* 29.1*  PLT 346 359   Recent Labs    12/05/17 0613 12/06/17 0622  NA 134* 132*  K 3.4* 3.7  CL 96* 98  CO2 25 23  GLUCOSE 173* 112*  BUN 23 38*  CREATININE 3.27* 4.37*  CALCIUM 8.3* 8.5*    Intake/Output Summary (Last 24 hours) at 12/06/2017 0802 Last data filed at 12/05/2017 1900 Gross per 24 hour  Intake 560 ml  Output -  Net 560 ml     Physical Exam: Vital Signs Blood pressure (!) 167/79, pulse 88, temperature 98.6 F (37 C), resp. rate 16, height 5' (1.524 m), weight 44.2 kg, SpO2 96 %. Constitutional: No distress . Vital signs reviewed. HEENT: EOMI, oral membranes moist Neck: supple Cardiovascular: RRR without murmur. No JVD    Respiratory: CTA Bilaterally without wheezes or rales. Normal effort    GI: BS +, non-tender, non-distended  Musculoskeletal:  No edema or tenderness in extremities  Neurological: She is alert.  Speech fairly clear. Follows basic commands.  Motor: motor 4/5 throughout LUE ataxia Skin: Skin is warm and dry.  Psychiatric: cooperative and pleasant     Assessment/Plan: 1. 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  Physiatrist is providing close team supervision and 24 hour management of active medical problems listed below.  Physiatrist and rehab team continue to assess barriers to discharge/monitor patient progress toward functional  and medical goals  Care Tool:  Bathing  Bathing activity did not occur: Refused Body parts bathed by patient: Right arm, Left arm, Chest, Abdomen, Right upper leg, Face, Left upper leg, Front perineal area, Buttocks, Right lower leg, Left lower leg         Bathing assist Assist Level: Supervision/Verbal cueing     Upper Body Dressing/Undressing Upper body dressing Upper body dressing/undressing activity did not occur (including orthotics): Refused What is the patient wearing?: Pull over shirt    Upper body assist Assist Level: Set up assist    Lower Body Dressing/Undressing Lower body dressing    Lower body dressing activity did not occur: Refused What is the patient wearing?: Pants, Underwear/pull up     Lower body assist Assist for lower body dressing: Supervision/Verbal cueing     Toileting Toileting    Toileting assist Assist for toileting: Minimal Assistance - Patient > 75%     Transfers Chair/bed transfer  Transfers assist     Chair/bed transfer assist level: Minimal Assistance - Patient > 75%     Locomotion Ambulation   Ambulation assist      Assist level: Supervision/Verbal cueing Assistive device: Walker-rolling Max distance: 160'   Walk 10 feet activity   Assist     Assist level: Supervision/Verbal cueing Assistive device: Walker-rolling   Walk 50 feet activity   Assist    Assist level: Supervision/Verbal cueing Assistive device: Walker-rolling    Walk 150 feet activity   Assist Walk 150  feet activity did not occur: Safety/medical concerns  Assist level: Supervision/Verbal cueing Assistive device: Walker-rolling    Walk 10 feet on uneven surface  activity   Assist     Assist level: Moderate Assistance - Patient - 50 - 74% Assistive device: Hand held assist   Wheelchair     Assist Will patient use wheelchair at discharge?: No   Wheelchair activity did not occur: Safety/medical concerns          Wheelchair 50 feet with 2 turns activity    Assist    Wheelchair 50 feet with 2 turns activity did not occur: Safety/medical concerns       Wheelchair 150 feet activity     Assist Wheelchair 150 feet activity did not occur: Safety/medical concerns        Medical Problem List and Plan: 1.  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    ---Continue CIR therapies including PT, OT, and SLP   2.  DVT Prophylaxis/Anticoagulation: Pharmaceutical: Lovenox 3. Pain Management: tylenol prn.  4. Mood: LCSW to follow for evaluation and support.  5. Neuropsych: This patient is not fully capable of making decisions on her own behalf. 6. Skin/Wound Care: Routine pressure relief measures measures.  - added prn benadryl at night to help with itching 7. Fluids/Electrolytes/Nutrition: Strict I's and O's.   - Labs with dialysis. 8.  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   Pharmacy dosing on [facility]  -Neurology recommended to increase Topamax to 50 mg twice daily (on 25mg  currently)---no change at present 9.  History of hydronephrosis s/p stents and recurrent E. coli UTI: Urology with recommendations to continue antibiotics. Ongoing temps  -UA+   -ucx multispecies  -3 day course of keflex completed 10.  T2DM: Intake improving with improvement in mentation.  Low-dose Lantus resumed  .  -Increased Lantus insulin to 10 units daily ln 10/16, increase to 14units tomorrow morning  -SSI coverage   CBG (last 3)  Recent Labs    12/05/17 1644 12/05/17 2153 12/06/17 0655  GLUCAP 160* 209* 107*  controlled this am 10/21  11.  Acute on chronic renal failure/dialysis dependent: continue  hemodialysis TTS at the end of the day to help with  tolerance of therapy Nephrology to manage 12.  Anemia of chronic disease:   Continue Aranesp.     LOS: 7 days A FACE TO FACE EVALUATION WAS PERFORMED  Charlett Blake 12/06/2017, 8:02 AM

## 2017-12-06 NOTE — Progress Notes (Signed)
[facility] for phenytoin dosing Indication: Patient safety/ therapeutic monitoring  Allergies  Allergen Reactions  . Aspirin Palpitations   Patient Measurements: Height: 5' (152.4 cm) Weight: 97 lb 7.1 oz (44.2 kg) IBW/kg (Calculated) : 45.5  Vital Signs: Temp: 98.6 F (37 C) (10/21 0511) BP: 167/79 (10/21 0511) Pulse Rate: 88 (10/21 0511) Intake/Output from previous day: 10/20 0701 - 10/21 0700 In: 560 [P.O.:560] Out: -  Intake/Output from this shift: No intake/output data recorded.  Labs: Recent Labs    12/03/17 0853  12/04/17 1417 12/05/17 0613 12/06/17 0622  WBC 7.3  --  9.1  --   --   HGB 8.7*  --  8.7*  --   --   HCT 28.3*  --  29.1*  --   --   PLT 346  --  359  --   --   CREATININE 5.51*   < > 5.94* 3.27* 4.37*  PHOS 4.9*   < > 5.2* 3.7 5.6*  ALBUMIN 2.2*  2.1*   < > 2.1* 2.0* 2.2*   < > = values in this interval not displayed.   Estimated Creatinine Clearance: 9.2 mL/min (A) (by C-G formula based on SCr of 4.37 mg/dL (H)).  Assessment: 62 yo F recently initiated on dialysis started on phenytoin inpatient for breakthrough seizures. Neurology initially followed pt & wanted pharmacy to take over dosing for phenytoin. Pt's dose was reduced from 100mg  q8h to 125mg  q12h on 10/2.   Last total phenytoin level was 5 which corrects to 9.6 with low albumin in ESRD. No further seizures noted. Dose increased slightly on 10/18.  Monitoring Corrected phenytoin level = 10-47mcg/mL  Plan:  Continue phenytoin 150mg  BID Monitor clinical status, seizures, phenytoin levels prn  Salome Arnt, PharmD, BCPS Please see AMION for all pharmacy numbers 12/06/2017 7:59 AM

## 2017-12-06 NOTE — Progress Notes (Signed)
Physical Therapy Session Note  Patient Details  Name: Erica [name] MRN: [id number] Date of Birth: Jun 28, 1955  Today's Date: 12/06/2017 PT Individual Time: 1105-1204 and 9449-6759 PT Individual Time Calculation (min): 59 min and 61 min  Short Term Goals: Week 1:  PT Short Term Goal 1 (Week 1): Pt will ambulate 75 ft with LRAD & supervision.  PT Short Term Goal 2 (Week 1): Pt will negotiate stairs with 1 rail and min assist for strengtening & balance.  Skilled Therapeutic Interventions/Progress Updates:  Treatment 1: Pt received in w/c with nursing student in room assessing pt. Pt agreeable to tx, reporting sharp pain in feet that dissipates with walking & nursing student aware. Pt completes sit<>stand transfers with cuing to push up on armrests vs pulling on RW. Pt reports need to use restroom & ambulates in room/bathroom with RW & supervision with significantly decreased gait speed. Pt completed clothing management with supervision for standing balance, sit<>stand from toilet with supervision assist with grab bar & RW. Pt with continent void & BM on toilet and performed peri hygiene and hand hygiene with supervision. Pt ambulates room>ortho gym>apartment>dayroom>room with RW & supervision, with significantly decreased gait speed but an improvement in endurance as pt did not require rest breaks on this date. Pt completes 5x sit<>stand with min<>mod assist with cuing for anterior weight shifting and hip extensor activation to come to full upright standing. Pt relies heavily on BLE posterior lean on mat table with therapist adjusting pt's technique to limit BLE lean on mat table. Pt completed transfers from various furniture (rocking recliner, chair without armrests) with cuing for hand placement & supervision overall. Utilized Biodex Limits of Stability with mod<>max assist for manual facilitation and max cuing for weight shifting with pt demonstrating impaired cognition & problem solving with task  and decreased weight shifting to R side. At end of session pt left sitting in w/c in room with chair alarm donned, call bell in reach, & set up with meal tray. Pt reports numbness in RUE with intermittent numbness in BUE.  Treatment 2: Pt received in w/c & agreeable to tx, no c/o pain reported but pt states she feels lightheaded & vitals assessed but afterwards pt denies any continued feelings of lightheadedness. Pt ambulates throughout unit with RW & supervision. Pt completes floor transfer with supervision mat table>floor but min assist floor>sitting on EOM 2/2 decreased strength & ability to push up with extremities. At rail in hallway pt performed side stepping with occasional cuing for technique with task focusing on LE strengthening. Retrograde gait with 1 UE support with cuing and return demo of increased gait speed with min assist for weight shifting L<>R with task focusing on balance & NMR. In BI gym pt utilized dynavision while standing on noncompliant floor with narrow BOS & compliant foam with normal BOS with CGA assist for balance with pt able to correct any LOB and pressing lights with BUE with task focusing on dynamic balance. Pt returned to room & transferred sit>supine with supervision assist. Pt left in bed with alarm set & all needs in reach. Pt with B edema & RN made aware.   Therapy Documentation Precautions:  Precautions Precautions: Fall Precaution Comments: seizures Restrictions Weight Bearing Restrictions: No    Vital Signs: Therapy Vitals Pulse Rate: 85 BP: (!) 162/77 Patient Position (if appropriate): Sitting, LUE Oxygen Therapy SpO2: 100 % O2 Device: Room Air    Therapy/Group: Individual Therapy  [name] 12/06/2017, 3:42 PM

## 2017-12-06 NOTE — Progress Notes (Signed)
Occupational Therapy Session Note  Patient Details  Name: VENETA SLITER MRN: [id number] Date of Birth: 05/05/1955  Today's Date: 12/06/2017 OT Individual Time: 0905-1003 OT Individual Time Calculation (min): 58 min    Short Term Goals: Week 1:  OT Short Term Goal 1 (Week 1): Pt will complete LB dressing with supervision OT Short Term Goal 2 (Week 1): Pt will complete bathing with supervision at sit > stand level OT Short Term Goal 3 (Week 1): Pt will complete toilet transfers ambulating with LRAD OT Short Term Goal 4 (Week 1): Pt will complete 2 grooming tasks in standing to increase activity tolerance   Skilled Therapeutic Interventions/Progress Updates:    Treatment session with focus on ADL retraining with functional transfers and dynamic standing balance.  Pt received upright in bed with nurse providing AM meds.  Pt ambulated to toilet with RW with min/contact guard.  Completed toileting with contact guard and min question cues for sequencing as pt waiting for input from therapist.  Returned to sink and completed bathing and dressing at sit > stand level at sink with distant supervision with exception of min/contact guard when pulling pants over hips.  Engaged in peg board activity with focus on use of BUE for strength and coordination.  Pt able to complete 2 patterns with increased time but no cues for problem solving.  Returned to room and left upright in w/c with seat belt alarm and all needs in reach.  Therapy Documentation Precautions:  Precautions Precautions: Fall Precaution Comments: seizures Restrictions Weight Bearing Restrictions: No Pain:  Pt with no c/o pain   Therapy/Group: Individual Therapy  Simonne Come 12/06/2017, 11:06 AM

## 2017-12-06 NOTE — Progress Notes (Signed)
Social Work Patient ID: [name], female   DOB: 12/18/1955, 62 y.o.   MRN: [id number]  Team feels pt is moving quickly in her progress with therapies toward her goals of supervision level, but issue is her medical need and the dialysis issue. Son and aunt on the phone to discuss they would like to talk to a MD due to they have no answers regarding pt's medical issues. How do you plan for discharge if you don't know medically what she will need. Son feels she will need to go to a ALF to continue her therapies and if necessary have transportation to dialysis. They are also trying to find out information regarding pt's medical issues. Son is also concerned about the stents she has and the need for them to come out but this was done at [facility]. Will try to work on a discharge plan for pt.

## 2017-12-06 NOTE — Progress Notes (Signed)
Occupational Therapy Session Note  Patient Details  Name: Erica [name] MRN: [id number] Date of Birth: May 03, 1955  Today's Date: 12/06/2017 OT Individual Time: 1300-1330 OT Individual Time Calculation (min): 30 min    Short Term Goals: Week 1:  OT Short Term Goal 1 (Week 1): Pt will complete LB dressing with supervision OT Short Term Goal 2 (Week 1): Pt will complete bathing with supervision at sit > stand level OT Short Term Goal 3 (Week 1): Pt will complete toilet transfers ambulating with LRAD OT Short Term Goal 4 (Week 1): Pt will complete 2 grooming tasks in standing to increase activity tolerance   Skilled Therapeutic Interventions/Progress Updates:    Treatment session with focus on dynamic balance and activity tolerance.  Pt ambulated to [facility] gym with RW with supervision.  Engaged in [place] in standing on compliant surface with focus on balance reactions and endurance.  Pt utilized RUE and then LUE, noted LUE to have mildly decreased coordination completing 20 and 21 in 60 seconds compared to 27 and 29 in 60 seconds with Rt hand.  Min guard for standing balance on compliant surface.  Returned to room as above and left upright in w/c with all needs in reach and seat belt alarm on.  Therapy Documentation Precautions:  Precautions Precautions: Fall Precaution Comments: seizures Restrictions Weight Bearing Restrictions: No General:   Vital Signs: Therapy Vitals Temp: 98.6 F (37 C) Pulse Rate: 85 Resp: 18 BP: (!) 162/77 Patient Position (if appropriate): Sitting Oxygen Therapy SpO2: 100 % O2 Device: Room Air Pain: Pain Assessment Pain Score: 0-No pain   Therapy/Group: Individual Therapy  Simonne Come 12/06/2017, 3:46 PM

## 2017-12-06 NOTE — Progress Notes (Signed)
Subjective:  NO UOP recorded, she says a little.   BP if anything is high Objective Vital signs in last 24 hours: Vitals:   12/05/17 0429 12/05/17 1412 12/05/17 2144 12/06/17 0511  BP:  (!) 148/74 (!) 171/84 (!) 167/79  Pulse:  96 94 88  Resp:  17 16 16   Temp:  98.6 F (37 C) 98.9 F (37.2 C) 98.6 F (37 C)  TempSrc:      SpO2:  99% 97% 96%  Weight: 45.7 kg   44.2 kg  Height:       Weight change: -4.4 kg  Intake/Output Summary (Last 24 hours) at 12/06/2017 1308 Last data filed at 12/06/2017 1108 Gross per 24 hour  Intake 460 ml  Output -  Net 460 ml    Assessment/ Plan: Pt is a 62 y.o. yo female who was admitted on 11/29/2017 with recurrent A on CRF after pyelo- dialysis initiated 9/27 at [facility]- then was found to have CVA- transferred to [facility]  Assessment/Plan: 1. Renal- intermittently HD dep-  Done 10/12 then next on 10/19.  No UOP recorded but must be making some- she says only very little.  Cont to monitor for HD needs- has TDC placed 10/7.  Prior to most recent admission crt was around 4   2. HTN/vol- BP if anything high- NO BP meds here or recorded as OP- will start coreg since pulse high as well  3. Anemia- hgb in 8's- is on ESA 4. CVA- on rehab/keppra  5. GU- recent pyelo- stents in place    [place]: Basic Metabolic Panel: Recent Labs  Lab 12/04/17 1417 12/05/17 0613 12/06/17 0622  NA 129* 134* 132*  K 4.1 3.4* 3.7  CL 100 96* 98  CO2 20* 25 23  GLUCOSE 172* 173* 112*  BUN 70* 23 38*  CREATININE 5.94* 3.27* 4.37*  CALCIUM 8.6* 8.3* 8.5*  PHOS 5.2* 3.7 5.6*   Liver Function Tests: Recent Labs  Lab 12/04/17 1417 12/05/17 0613 12/06/17 0622  ALBUMIN 2.1* 2.0* 2.2*   No results for input(s): LIPASE, AMYLASE in the last 168 hours. No results for input(s): AMMONIA in the last 168 hours. CBC: Recent Labs  Lab 12/03/17 0853 12/04/17 1417  WBC 7.3 9.1  HGB 8.7* 8.7*  HCT 28.3* 29.1*  MCV 94.0 96.0  PLT 346  359   Cardiac Enzymes: No results for input(s): CKTOTAL, CKMB, CKMBINDEX, TROPONINI in the last 168 hours. CBG: Recent Labs  Lab 12/05/17 1145 12/05/17 1644 12/05/17 2153 12/06/17 0655 12/06/17 1207  GLUCAP 187* 160* 209* 107* 220*    Iron Studies: No results for input(s): IRON, TIBC, TRANSFERRIN, FERRITIN in the last 72 hours. Studies/Results: No results found. Medications: Infusions: . ferric gluconate (FERRLECIT/NULECIT) IV      Scheduled Medications: . bacitracin   Topical BID  . Chlorhexidine Gluconate Cloth  6 each Topical Q0600  . darbepoetin (ARANESP) injection - DIALYSIS  150 mcg Subcutaneous Q Thu-1800  . feeding supplement (NEPRO CARB STEADY)  237 mL Oral BID WC  . heparin  5,000 Units Subcutaneous Q8H  . insulin aspart  0-5 Units Subcutaneous QHS  . insulin aspart  0-9 Units Subcutaneous TID WC  . insulin glargine  10 Units Subcutaneous Daily  . levETIRAcetam  1,000 mg Oral Daily  . levETIRAcetam  500 mg Oral Q T,Th,Sat-1800  . multivitamin  1 tablet Oral QHS  . phenytoin  150 mg Oral BID  . saccharomyces boulardii  250 mg  Oral BID  . topiramate  25 mg Oral BID    have reviewed scheduled and prn medications.  Physical Exam: General: NAD- not great historian Heart: RRR Lungs: mostly clear Abdomen: distended, slightly tender Extremities: pitting edema Dialysis Access: TDC on right     12/06/2017,1:08 PM  LOS: 7 days

## 2017-12-07 ENCOUNTER — Inpatient Hospital Stay (HOSPITAL_COMMUNITY): Payer: Medicaid Other | Admitting: Physical Therapy

## 2017-12-07 ENCOUNTER — Inpatient Hospital Stay (HOSPITAL_COMMUNITY): Payer: Medicaid Other | Admitting: Occupational Therapy

## 2017-12-07 LAB — RENAL FUNCTION PANEL
ALBUMIN: 2.2 g/dL — AB (ref 3.5–5.0)
Anion gap: 13 (ref 5–15)
BUN: 49 mg/dL — AB (ref 8–23)
CHLORIDE: 96 mmol/L — AB (ref 98–111)
CO2: 22 mmol/L (ref 22–32)
Calcium: 8.6 mg/dL — ABNORMAL LOW (ref 8.9–10.3)
Creatinine, Ser: 5.38 mg/dL — ABNORMAL HIGH (ref 0.44–1.00)
GFR calc Af Amer: 9 mL/min — ABNORMAL LOW (ref >60)
GFR, EST NON AFRICAN AMERICAN: 8 mL/min — AB (ref >60)
GLUCOSE: 183 mg/dL — AB (ref 70–99)
POTASSIUM: 3.4 mmol/L — AB (ref 3.5–5.1)
Phosphorus: 6.6 mg/dL — ABNORMAL HIGH (ref 2.5–4.6)
Sodium: 131 mmol/L — ABNORMAL LOW (ref 135–145)

## 2017-12-07 LAB — GLUCOSE, CAPILLARY
GLUCOSE-CAPILLARY: 176 mg/dL — AB (ref 70–99)
Glucose-Capillary: 165 mg/dL — ABNORMAL HIGH (ref 70–99)
Glucose-Capillary: 97 mg/dL (ref 70–99)
Glucose-Capillary: 103 mg/dL — ABNORMAL HIGH (ref 70–99)

## 2017-12-07 MED ORDER — CHLORHEXIDINE GLUCONATE CLOTH 2 % EX PADS
6.0000 | MEDICATED_PAD | Freq: Every day | CUTANEOUS | Status: DC
  Administered 2017-12-08 – 2017-12-10 (×2): 6 via TOPICAL

## 2017-12-07 MED ORDER — LEVETIRACETAM 500 MG PO TABS
500.0000 mg | ORAL_TABLET | ORAL | Status: DC
  Administered 2017-12-08: 500 mg via ORAL
  Filled 2017-12-07: qty 1

## 2017-12-07 NOTE — Progress Notes (Signed)
[place] PHYSICAL MEDICINE & REHABILITATION PROGRESS NOTE   Subjective/Complaints: Appreciate nephro note  ROS: Patient denies  nausea, vomiting, diarrhea, cough, shortness of breath or chest pain, joint or back pain,   Objective:   No results found. Recent Labs    12/04/17 1417  WBC 9.1  HGB 8.7*  HCT 29.1*  PLT 359   Recent Labs    12/06/17 0622 12/07/17 0501  NA 132* 131*  K 3.7 3.4*  CL 98 96*  CO2 23 22  GLUCOSE 112* 183*  BUN 38* 49*  CREATININE 4.37* 5.38*  CALCIUM 8.5* 8.6*    Intake/Output Summary (Last 24 hours) at 12/07/2017 0820 Last data filed at 12/07/2017 0755 Gross per 24 hour  Intake 720 ml  Output -  Net 720 ml     Physical Exam: Vital Signs Blood pressure (!) 152/74, pulse 87, temperature 99.2 F (37.3 C), resp. rate 16, height 5' (1.524 m), weight 44.3 kg, SpO2 96 %. Constitutional: No distress . Vital signs reviewed. HEENT: EOMI, oral membranes moist Neck: supple Cardiovascular: RRR without murmur. No JVD    Respiratory: CTA Bilaterally without wheezes or rales. Normal effort    GI: BS +, non-tender, non-distended  Musculoskeletal:  No edema or tenderness in extremities  Neurological: She is alert.  Speech fairly clear. Follows basic commands.  Motor: motor 4/5 throughout LUE ataxia Skin: Skin is warm and dry.  Psychiatric: cooperative and pleasant     Assessment/Plan: 1. 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  Physiatrist is providing close team supervision and 24 hour management of active medical problems listed below.  Physiatrist and rehab team continue to assess barriers to discharge/monitor patient progress toward functional and medical goals  Care Tool:  Bathing  Bathing activity did not occur: Refused Body parts bathed by patient: Right arm, Left arm, Chest, Abdomen,  Right upper leg, Face, Left upper leg, Front perineal area, Buttocks, Right lower leg, Left lower leg         Bathing assist Assist Level: Supervision/Verbal cueing     Upper Body Dressing/Undressing Upper body dressing Upper body dressing/undressing activity did not occur (including orthotics): Refused What is the patient wearing?: Pull over shirt    Upper body assist Assist Level: Set up assist    Lower Body Dressing/Undressing Lower body dressing    Lower body dressing activity did not occur: Refused What is the patient wearing?: Pants, Underwear/pull up     Lower body assist Assist for lower body dressing: Supervision/Verbal cueing     Toileting Toileting    Toileting assist Assist for toileting: Contact Guard/Touching assist     Transfers Chair/bed transfer  Transfers assist     Chair/bed transfer assist level: Minimal Assistance - Patient > 75%     Locomotion Ambulation   Ambulation assist      Assist level: Supervision/Verbal cueing Assistive device: Walker-rolling Max distance: 150 ft    Walk 10 feet activity   Assist     Assist level: Supervision/Verbal cueing Assistive device: Walker-rolling   Walk 50 feet activity   Assist    Assist level: Supervision/Verbal cueing Assistive device: Walker-rolling    Walk 150 feet activity   Assist Walk 150 feet activity did not occur: Safety/medical concerns  Assist level: Supervision/Verbal cueing Assistive device: Walker-rolling    Walk 10 feet on uneven surface  activity   Assist  Assist level: Moderate Assistance - Patient - 50 - 74% Assistive device: Hand held assist   Wheelchair     Assist Will patient use wheelchair at discharge?: No   Wheelchair activity did not occur: Safety/medical concerns         Wheelchair 50 feet with 2 turns activity    Assist    Wheelchair 50 feet with 2 turns activity did not occur: Safety/medical concerns       Wheelchair  150 feet activity     Assist Wheelchair 150 feet activity did not occur: Safety/medical concerns        Medical Problem List and Plan: 1.  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    ---Continue CIR therapies including PT, OT, and SLP  Team conf in am 2.  DVT Prophylaxis/Anticoagulation: Pharmaceutical: Lovenox 3. Pain Management: tylenol prn.  4. Mood: LCSW to follow for evaluation and support.  5. Neuropsych: This patient is not fully capable of making decisions on her own behalf. 6. Skin/Wound Care: Routine pressure relief measures measures.  - added prn benadryl at night to help with itching 7. Fluids/Electrolytes/Nutrition: Strict I's and O's.   - Labs with dialysis. 8.  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   Pharmacy dosing on [facility]  -Neurology recommended to increase Topamax to 50 mg twice daily (on 25mg  currently)---no change at present 9.  History of hydronephrosis s/p stents and recurrent E. coli UTI: Urology with recommendations to continue antibiotics. Ongoing temps  -UA+   -ucx multispecies  -3 day course of keflex completed 10.  T2DM: Intake improving with improvement in mentation.  Low-dose Lantus resumed  .  -Increased Lantus insulin to 10 units daily ln 10/16, increase to 14units tomorrow morning  -SSI coverage   CBG (last 3)  Recent Labs    12/06/17 1639 12/06/17 2152 12/07/17 0655  GLUCAP 159* 93 165*  controlled this am 10/22- avoid hypoglycemia, keep CBG >100  11.  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  Anemia of chronic disease:   Continue Aranesp.     LOS: 8 days A FACE  TO FACE EVALUATION WAS PERFORMED  Charlett Blake 12/07/2017, 8:20 AM

## 2017-12-07 NOTE — Progress Notes (Signed)
Recreational Therapy Session Note  Patient Details  Name: Erica [name] MRN: [id number] Date of Birth: [birth year] Today's Date: 12/07/2017 Time:  1030-1050 Pain: no c/o  Skilled Therapeutic Interventions/Progress Updates:  Goal:  Pt will participate in dynamic standing balance task without UE use with            Supervision.  Session focused on activity tolerance, ambulation using RW, dynamic standing balance, & discharge planning during co-treat with PT.  Pt ambulated from the gym to the dayroom using RW with supervision.  Once in the dayroom, pt required seated rest break due to fatigue.  Pt stood for ball toss activity ~3 minutes with verbal cues for encouragement.  During seated rest break, discussed discharge planning.  Pt stated she had concerns about discharge but was unable to state the concerns.  With follow up questions, pt stated that she didn't think she would be ok alone at home.  PT and LRT stated that the teams recommendation was 24 hour supervision.  Pt smiled and said sge felt better about an upcoming discharge.    Therapy/Group: Co-Treatment  Kelin Nixon 12/07/2017, 12:55 PM

## 2017-12-07 NOTE — Progress Notes (Signed)
Social Work Patient ID: Erica [name], female   DOB: January 17, 1956, 62 y.o.   MRN: [id number] Have contacted [facility] in [place] to try to get pt set up with them for HD. Have faxed medical information. May need assistance via Dr. Louie Boston to get into facility. Have also contacted [facility] to pursue her going there upon discharge from rehab. Son feels she will need to go to another facility for more rehab and supervision level since he can not provide this at home. Will work on this plan. Wait to hear back from [facility] and [facility]

## 2017-12-07 NOTE — Progress Notes (Signed)
Late note for phone call 12/06/17 Had conference call with patient's son [name] as well as her sister for update and address their medical concerns:  1. Diabetes: Patient with one episode of BS in 50's. Discussed recent insulin changes as well as importance of keeping BS in 80-100 range to prevent end organ damage. 2. Renal stents: GU here has recommended follow up with her urologist at [facility] for stent removal.  3. Acute on chronic renal failure:  Have discussed patient with Dr. Moshe Cipro who intends on coming up with a plan in the next 24-48 hours and she plans on contacting the son with update and plan. Discussed with family that surveillance recommended at this time and they can elect to follow up with Dr. Hinda Lenis or [facility] after discharge.   4. Therapy recommending 24 hours supervision at discharge.

## 2017-12-07 NOTE — Progress Notes (Signed)
Physical Therapy Session Note  Patient Details  Name: Erica [name] MRN: [id number] Date of Birth: 1955-11-09  Today's Date: 12/07/2017 PT Individual Time: [phone] and 1019-1102 PT Individual Time Calculation (min): 59 min and 43 min   Short Term Goals: Week 1:  PT Short Term Goal 1 (Week 1): Pt will ambulate 75 ft with LRAD & supervision.  PT Short Term Goal 2 (Week 1): Pt will negotiate stairs with 1 rail and min assist for strengtening & balance.  Skilled Therapeutic Interventions/Progress Updates:  Treatment 1: Pt received in bed & agreeable to tx. Pt denied c/o pain. Pt transferred OOB with supervision & hospital bed features, ambulated around room with RW & distant supervision to collect clothing from drawers. Pt returned to EOB & doffed gown & donned shirt (mod I), socks, shoes, & pants with distant supervision with significantly extra time & therapist only providing assistance for tightening brief. Standing at sink pt performed grooming task (combing hair) with distant supervision. While waiting for RN to administer meds pt completed 5x sit<>stand without BUE support for BLE strengthening with decreased reliance on BLE posterior lean on bed. MD suggests B ted hose for edema management & therapist donned them total assist for time management. Pt ambulates room>gym>ortho gym>room with RW & supervision with encouragement but poor return demo for increased gait speed; pt also required assistance to correct 1 LOB & cuing to steer walker straight as pt frequently veering to L. Throughout session therapist provides cuing for hand placement for sit<>stand transfers to increase safety as pt attempts to have BUE on RW. Pt negotiates 24 steps with B rails & supervision for LE strengthening. Pt completed car transfer at sedan simulated height with supervision overall. At end of session pt left sitting on EOB in handoff to OT.  During session pt reports her B feet feel tingling and like they are  "just waking up".   Treatment 2: Pt received in w/c & agreeable to tx. No c/o pain reported. Pt completes sit<>stand transfers with supervision and ambulates room>apartment>dayroom>room with RW & supervision with cuing for steering RW straight as pt continues to steer to L. Pt completed transfer from low compliant couch with supervision assist. Pt engaged in ball toss with close supervision with task focusing on balance while engaging in conversation with recreational therapist. Pt appeared fatigued during session but agreeable to nu-step and utilized device on level 2 x 5 minutes with all four extremities with task focusing on endurance training & coordination of reciprocal movements. At end of session pt left sitting in w/c in room with chair alarm donned & all needs in reach.   Therapy Documentation Precautions:  Precautions Precautions: Fall Precaution Comments: seizures Restrictions Weight Bearing Restrictions: No    Therapy/Group: Individual Therapy  [name] 12/07/2017, 11:15 AM

## 2017-12-07 NOTE — Progress Notes (Signed)
Subjective:  NO UOP recorded, she says a little "I wet the bed"    BP if anything is high Objective Vital signs in last 24 hours: Vitals:   12/06/17 1412 12/06/17 1442 12/06/17 2027 12/07/17 0555  BP: (!) 148/65 (!) 162/77 (!) 164/81 (!) 152/74  Pulse: 87 85 91 87  Resp: 18  16 16   Temp: 98.6 F (37 C)  98.9 F (37.2 C) 99.2 F (37.3 C)  TempSrc: Oral     SpO2: 100% 100% 97% 96%  Weight:    44.3 kg  Height:       Weight change: 0.1 kg  Intake/Output Summary (Last 24 hours) at 12/07/2017 1224 Last data filed at 12/07/2017 1219 Gross per 24 hour  Intake 600 ml  Output -  Net 600 ml    Assessment/ Plan: Pt is a 62 y.o. yo female who was admitted on 11/29/2017 with recurrent A on CRF after pyelo- dialysis initiated 9/27 at [facility]- then was found to have CVA- transferred to [facility]  Assessment/Plan: 1. Renal- intermittently HD dep-  Done 10/12 then next on 10/19.  No UOP recorded but must be making some- she says only very little.   Has TDC placed 10/7.  Prior to most recent admission crt was around 4 and now I see that BUN and crt are worsening daily off of HD- I do not think is likely she will be able to do without HD- I think would be best to send out on HD but looking for signs of recovery.   Now looking into SNF ?? Do not know where it will be so dont know where to look for OP HD.  IF she is to go to the OP units in the [facility] system it is not our renal staff that sets it up, only if she goes to a [facility] unit.  I will plan on doing HD tomorrow here and will work with SW to set up an OP spot.  I have not pursued perm access yet, maybe give trial of about 4-6 weeks and if still HD dep will get    2. HTN/vol- BP if anything high- NO BP meds here or recorded as OP- will start coreg since pulse high as well - needs UF with HD 3. Anemia- hgb in 8's- is on ESA 4. CVA- on rehab/keppra  5. GU- recent pyelo- stents in place - I guess that GU here wants her to get stents  removed/changed where she had them put in at [facility]- not sure how practical that might be    Louis Meckel    Labs: Basic Metabolic Panel: Recent Labs  Lab 12/05/17 0613 12/06/17 0622 12/07/17 0501  NA 134* 132* 131*  K 3.4* 3.7 3.4*  CL 96* 98 96*  CO2 25 23 22   GLUCOSE 173* 112* 183*  BUN 23 38* 49*  CREATININE 3.27* 4.37* 5.38*  CALCIUM 8.3* 8.5* 8.6*  PHOS 3.7 5.6* 6.6*   Liver Function Tests: Recent Labs  Lab 12/05/17 0613 12/06/17 0622 12/07/17 0501  ALBUMIN 2.0* 2.2* 2.2*   No results for input(s): LIPASE, AMYLASE in the last 168 hours. No results for input(s): AMMONIA in the last 168 hours. CBC: Recent Labs  Lab 12/03/17 0853 12/04/17 1417  WBC 7.3 9.1  HGB 8.7* 8.7*  HCT 28.3* 29.1*  MCV 94.0 96.0  PLT 346 359   Cardiac Enzymes: No results for input(s): CKTOTAL, CKMB, CKMBINDEX, TROPONINI in the last 168 hours. CBG: Recent Labs  Lab 12/06/17 0655 12/06/17 1207 12/06/17 1639 12/06/17 2152 12/07/17 0655  GLUCAP 107* 220* 159* 93 165*    Iron Studies: No results for input(s): IRON, TIBC, TRANSFERRIN, FERRITIN in the last 72 hours. Studies/Results: No results found. Medications: Infusions: . ferric gluconate (FERRLECIT/NULECIT) IV      Scheduled Medications: . bacitracin   Topical BID  . carvedilol  6.25 mg Oral BID WC  . Chlorhexidine Gluconate Cloth  6 each Topical Q0600  . darbepoetin (ARANESP) injection - DIALYSIS  150 mcg Subcutaneous Q Thu-1800  . feeding supplement (NEPRO CARB STEADY)  237 mL Oral BID WC  . heparin  5,000 Units Subcutaneous Q8H  . insulin aspart  0-5 Units Subcutaneous QHS  . insulin aspart  0-9 Units Subcutaneous TID WC  . insulin glargine  10 Units Subcutaneous Daily  . levETIRAcetam  1,000 mg Oral Daily  . levETIRAcetam  500 mg Oral Q T,Th,Sat-1800  . multivitamin  1 tablet Oral QHS  . phenytoin  150 mg Oral BID  . saccharomyces boulardii  250 mg Oral BID  . topiramate  25 mg Oral BID    have  reviewed scheduled and prn medications.  Physical Exam: General: NAD- not great historian Heart: RRR Lungs: mostly clear Abdomen: distended, slightly tender Extremities: pitting edema Dialysis Access: TDC on right     12/07/2017,12:24 PM  LOS: 8 days

## 2017-12-07 NOTE — Progress Notes (Signed)
Occupational Therapy Session Note  Patient Details  Name: Erica [name] MRN: [id number] Date of Birth: August 10, 1955  Today's Date: 12/07/2017 OT Individual Time: 9485-4627 OT Individual Time Calculation (min): 35 min    Short Term Goals: Week 1:  OT Short Term Goal 1 (Week 1): Pt will complete LB dressing with supervision OT Short Term Goal 2 (Week 1): Pt will complete bathing with supervision at sit > stand level OT Short Term Goal 3 (Week 1): Pt will complete toilet transfers ambulating with LRAD OT Short Term Goal 4 (Week 1): Pt will complete 2 grooming tasks in standing to increase activity tolerance   Skilled Therapeutic Interventions/Progress Updates:    Treatment session with focus on dynamic standing balance and endurance.  Pt received upright in w/c agreeable to therapeutic activity.  Engaged in table top task in standing at high-low table with focus on weight shifting and reaching across midline and outside BOS while completing activity.  Noted pt with occasional instability in standing, however with no LOB.  Pt required mod cues for sequencing of task due to novel task.  Pt reports itching in BLE and c/o increased swelling in BLE and ankles despite wearing TEDS.  LPN notified of pt request for benadryl due to itching.  Pt returned to bed supervision stand pivot transfer and doffed TEDS and elevated BLE.  LPN arrived to administer benadryl.  Pt left in semi-reclined position with BLE elevated and all needs in reach.  Therapy Documentation Precautions:  Precautions Precautions: Fall Precaution Comments: seizures Restrictions Weight Bearing Restrictions: No General:   Vital Signs: Therapy Vitals Temp: 98.6 F (37 C) Pulse Rate: 84 Resp: 20 BP: (!) 161/78 Patient Position (if appropriate): Lying Oxygen Therapy SpO2: 97 % O2 Device: Room Air Pain:  Pt with no c/o pain   Therapy/Group: Individual Therapy  Simonne Come 12/07/2017, 4:01 PM

## 2017-12-07 NOTE — Progress Notes (Signed)
Occupational Therapy Session Note  Patient Details  Name: Erica [name] MRN: [id number] Date of Birth: 04/25/1955  Today's Date: 12/07/2017 OT Individual Time: 0905-1005 OT Individual Time Calculation (min): 60 min    Short Term Goals: Week 1:  OT Short Term Goal 1 (Week 1): Pt will complete LB dressing with supervision OT Short Term Goal 2 (Week 1): Pt will complete bathing with supervision at sit > stand level OT Short Term Goal 3 (Week 1): Pt will complete toilet transfers ambulating with LRAD OT Short Term Goal 4 (Week 1): Pt will complete 2 grooming tasks in standing to increase activity tolerance   Skilled Therapeutic Interventions/Progress Updates:    Treatment session with focus on functional mobility and safety awareness with meal prep in ADL kitchen. Pt received in hand off from PT reporting pt with LOB this AM and reporting feet feel "tingly".  Pt already dressed this AM and agreeable to completing activities in ADL apt.  Engaged in meal prep with pt obtaining all items from cabinets and refrigerator to scramble eggs.  Pt completed mobility around kitchen with RW with close supervision.  Pt with 1 LOB when standing at refrigerator requiring min assist to correct.  Pt required increased time to sequence scrambled eggs but able to complete without cues.  Pt dropped an egg on the floor and overcooked the eggs as she set stove temp too high.  Recommend close supervision with any meal prep, especially when using heat or sharp objects, with pt in agreement.  Educated on tub/shower transfers with tub bench after pt completing stepping over tub ledge with heavy reliance on grab bar (which pt does not have).  Pt completed transfer with tub bench with supervision and pt reports sense of increased safety with transfer with tub bench.  Returned to room and ambulated to bathroom where pt voided urine and bowels on toilet and was found to have had incontinent BM in incontinence brief.  Pt  completed hygiene post toileting with setup for wash cloth and left with nurse tech to finish in bathroom.  Therapy Documentation Precautions:  Precautions Precautions: Fall Precaution Comments: seizures Restrictions Weight Bearing Restrictions: No Pain:  Pt with no c/o pain   Therapy/Group: Individual Therapy  Simonne Come 12/07/2017, 12:18 PM

## 2017-12-07 NOTE — NC FL2 (Signed)
[place] MEDICAID FL2 LEVEL OF CARE SCREENING TOOL     IDENTIFICATION  Patient Name: Erica [name] Birthdate: [birth year] Sex: female Admission Date (Current Location): 11/29/2017  [facility] and [place] Number:  [employer] and Address:  [facility], [place] [address], [place], [place] [address]      Provider Number: [id number]  Attending Physician Name and Address:  Charlett Blake, MD  Relative Name and Phone Number:  Lorenda Hatchet [phone]    Current Level of Care: Other (Comment)(Rehab) Recommended Level of Care: [place] Prior Approval Number:    Date Approved/Denied:   PASRR Number: [id number]  Discharge Plan: Other (Comment)(ALF)    Current Diagnoses: Patient Active Problem List   Diagnosis Date Noted  . Stroke ([place]) 11/29/2017  . Seizures ([place])   . Hydronephrosis   . Recurrent UTI   . Diabetes mellitus type 2 in nonobese (HCC)   . ESRD (end stage renal disease) ([place])   . Anemia of chronic disease   . Chronic diastolic congestive heart failure ([place])   . Essential hypertension   . AKI (acute kidney injury) ([place])   . PICC (peripherally inserted central catheter) in place   . Acute pyelonephritis 11/12/2017  . Acute renal failure superimposed on chronic kidney disease ([place]) 11/10/2017  . Uncontrolled type 2 diabetes mellitus with hyperglycemia, with long-term current use of insulin ([place]) 11/10/2017  . Renal failure 11/08/2017  . Seizure disorder ([place]) 11/08/2017  . Orthostatic hypotension 07/25/2014  . Moderate protein malnutrition ([place]) 09/15/2013  . Aspiration pneumonia ([place]) 09/15/2013  . Acute respiratory failure requiring reintubation ([place]) 09/11/2013  . probable Seizures due to metabolic disorder 63/78/5885  . Lactic acidosis 03/19/2013  . Abdominal pain 03/19/2013  . Rotavirus infection 10/29/2012  . Type II or unspecified type diabetes mellitus without mention of complication,  uncontrolled 10/29/2012  . Protein-calorie malnutrition, severe ([place]) 10/27/2012  . NSTEMI (non-ST elevated myocardial infarction) ([place]) 10/26/2012  . Fever, unspecified 10/26/2012  . Hypotension 10/25/2012  . Alcohol withdrawal ([place]) 10/25/2012  . Metabolic acidosis 02/77/4128  . Chronic diarrhea 10/25/2012  . Tobacco abuse 10/25/2012  . DKA (diabetic ketoacidoses) ([place]) 09/09/2012  . Dehydration 09/09/2012  . DKA, type 2 ([place]) 05/20/2012  . Abnormal LFTs 05/20/2012  . Heart murmur, systolic 78/67/6720  . Diabetes mellitus ([place]) 07/08/2011  . Hypoglycemia 07/08/2011  . Metabolic encephalopathy 94/70/9628  . Alcohol intoxication ([place]) 07/08/2011  . Alcohol abuse 07/08/2011  . Hypokalemia 07/08/2011  . Nausea & vomiting 07/08/2011  . Abdominal pain 07/08/2011  . H/O chronic pancreatitis 07/08/2011    Orientation RESPIRATION BLADDER Height & Weight     Self, Situation, Place  Normal Incontinent(Occasional incontinence) Weight: 97 lb 10.6 oz (44.3 kg) Height:  5' (152.4 cm)  BEHAVIORAL SYMPTOMS/MOOD NEUROLOGICAL BOWEL NUTRITION STATUS    Convulsions/Seizures(upon admission 9/23) Continent Diet(regular diet)  AMBULATORY STATUS COMMUNICATION OF NEEDS Skin   Supervision Verbally Normal                       Personal Care Assistance Level of Assistance  Bathing, Dressing Bathing Assistance: Limited assistance Feeding assistance: Independent Dressing Assistance: Limited assistance     Functional Limitations Info    Sight Info: Adequate Hearing Info: Adequate Speech Info: Adequate    SPECIAL CARE FACTORS FREQUENCY  PT (By licensed PT), OT (By licensed OT), Bowel and bladder program, Speech therapy     PT Frequency: 3x week OT Frequency: 3x week Bowel and  Bladder Program Frequency: Timed toileting for blader retraining   Speech Therapy Frequency: 3x week      Contractures Contractures Info: Not present    Additional Factors Info  Code Status, Allergies Code  Status Info: Full Allergies Info: Aspirin           Current Medications (12/07/2017):  This is the current hospital active medication list Current Facility-Administered Medications  Medication Dose Route Frequency Provider Last Rate Last Dose  . acetaminophen (TYLENOL) tablet 325-650 mg  325-650 mg Oral Q4H PRN Bary Leriche, PA-C   650 mg at 12/07/17 0056  . bacitracin ointment   Topical BID Love, Pamela S, PA-C      . bisacodyl (DULCOLAX) suppository 10 mg  10 mg Rectal Daily PRN Love, Pamela S, PA-C      . carvedilol (COREG) tablet 6.25 mg  6.25 mg Oral BID WC Corliss Parish, MD   6.25 mg at 12/07/17 0826  . Chlorhexidine Gluconate Cloth 2 % PADS 6 each  6 each Topical Q0600 Bary Leriche, PA-C   6 each at 12/07/17 0610  . Darbepoetin Alfa (ARANESP) injection 150 mcg  150 mcg Subcutaneous Q Thu-1800 Mauricia Area, MD   150 mcg at 12/02/17 1805  . diphenhydrAMINE (BENADRYL) 12.5 MG/5ML elixir 12.5-25 mg  12.5-25 mg Oral Q6H PRN Bary Leriche, PA-C   25 mg at 12/06/17 1807  . diphenoxylate-atropine (LOMOTIL) 2.5-0.025 MG per tablet 1 tablet  1 tablet Oral BID PRN Hammons, Theone Murdoch, RPH   1 tablet at 12/01/17 0745  . feeding supplement (NEPRO CARB STEADY) liquid 237 mL  237 mL Oral BID WC Bary Leriche, PA-C   237 mL at 12/07/17 8115  . ferric gluconate (NULECIT) 125 mg in sodium chloride 0.9 % 100 mL IVPB  125 mg Intravenous Nat Math, MD      . guaiFENesin-dextromethorphan (ROBITUSSIN DM) 100-10 MG/5ML syrup 5-10 mL  5-10 mL Oral Q6H PRN Love, Pamela S, PA-C      . heparin injection 5,000 Units  5,000 Units Subcutaneous Q8H Bary Leriche, PA-C   5,000 Units at 12/07/17 7262  . insulin aspart (novoLOG) injection 0-5 Units  0-5 Units Subcutaneous QHS Love, Pamela S, PA-C      . insulin aspart (novoLOG) injection 0-9 Units  0-9 Units Subcutaneous TID WC Bary Leriche, PA-C   2 Units at 12/07/17 0825  . insulin glargine (LANTUS) injection 10 Units  10 Units  Subcutaneous Daily Lisabeth Pick, MD   10 Units at 12/07/17 [phone]  . levETIRAcetam (KEPPRA) tablet 1,000 mg  1,000 mg Oral Daily Bary Leriche, PA-C   1,000 mg at 12/07/17 0825  . levETIRAcetam (KEPPRA) tablet 500 mg  500 mg Oral Q T,Th,Sat-1800 Kirsteins, Luanna Salk, MD   500 mg at 12/04/17 1900  . multivitamin (RENA-VIT) tablet 1 tablet  1 tablet Oral QHS Bary Leriche, PA-C   1 tablet at 12/06/17 2040  . ondansetron (ZOFRAN) tablet 4 mg  4 mg Oral Q6H PRN Love, Pamela S, PA-C       Or  . ondansetron (ZOFRAN) injection 4 mg  4 mg Intravenous Q6H PRN Love, Pamela S, PA-C      . phenytoin (DILANTIN) 125 MG/5ML suspension 150 mg  150 mg Oral BID Rumbarger, Valeda Malm, RPH   150 mg at 12/07/17 0826  . polyethylene glycol (MIRALAX / GLYCOLAX) packet 17 g  17 g Oral Daily PRN Love, Ivan Anchors, PA-C      .  saccharomyces boulardii (FLORASTOR) capsule 250 mg  250 mg Oral BID Bary Leriche, PA-C   250 mg at 12/07/17 0825  . senna-docusate (Senokot-S) tablet 1 tablet  1 tablet Oral QHS PRN Love, Pamela S, PA-C      . topiramate (TOPAMAX) tablet 25 mg  25 mg Oral BID Love, Pamela S, PA-C   25 mg at 12/07/17 0825  . traZODone (DESYREL) tablet 25-50 mg  25-50 mg Oral QHS PRN Bary Leriche, PA-C   50 mg at 12/07/17 0735     Discharge Medications: Please see discharge summary for a list of discharge medications.  Relevant Imaging Results:  Relevant Lab Results:   Additional Information SSN: [id number]  Currently receiving HD 2x week and MD deciding if and how much HD she will need at [facility], LCSW

## 2017-12-08 ENCOUNTER — Inpatient Hospital Stay (HOSPITAL_COMMUNITY): Payer: Medicaid Other

## 2017-12-08 ENCOUNTER — Inpatient Hospital Stay (HOSPITAL_COMMUNITY): Payer: Medicaid Other | Admitting: Physical Therapy

## 2017-12-08 DIAGNOSIS — Z87448 Personal history of other diseases of urinary system: Secondary | ICD-10-CM

## 2017-12-08 LAB — CBC
HEMATOCRIT: 31.2 % — AB (ref 36.0–46.0)
HEMOGLOBIN: 9.3 g/dL — AB (ref 12.0–15.0)
MCH: 29 pg (ref 26.0–34.0)
MCHC: 29.8 g/dL — AB (ref 30.0–36.0)
MCV: 97.2 fL (ref 80.0–100.0)
Platelets: 350 10*3/uL (ref 150–400)
RBC: 3.21 MIL/uL — ABNORMAL LOW (ref 3.87–5.11)
RDW: 22.6 % — ABNORMAL HIGH (ref 11.5–15.5)
WBC: 8.2 10*3/uL (ref 4.0–10.5)
nRBC: 0.2 % (ref 0.0–0.2)

## 2017-12-08 LAB — RENAL FUNCTION PANEL
Albumin: 2.3 g/dL — ABNORMAL LOW (ref 3.5–5.0)
Anion gap: 17 — ABNORMAL HIGH (ref 5–15)
BUN: 67 mg/dL — AB (ref 8–23)
CHLORIDE: 96 mmol/L — AB (ref 98–111)
CO2: 21 mmol/L — AB (ref 22–32)
Calcium: 8.9 mg/dL (ref 8.9–10.3)
Creatinine, Ser: 6.35 mg/dL — ABNORMAL HIGH (ref 0.44–1.00)
GFR calc Af Amer: 7 mL/min — ABNORMAL LOW (ref >60)
GFR calc non Af Amer: 6 mL/min — ABNORMAL LOW (ref >60)
GLUCOSE: 194 mg/dL — AB (ref 70–99)
POTASSIUM: 3.5 mmol/L (ref 3.5–5.1)
Phosphorus: 8.1 mg/dL — ABNORMAL HIGH (ref 2.5–4.6)
Sodium: 134 mmol/L — ABNORMAL LOW (ref 135–145)

## 2017-12-08 LAB — GLUCOSE, CAPILLARY
GLUCOSE-CAPILLARY: 98 mg/dL (ref 70–99)
GLUCOSE-CAPILLARY: 200 mg/dL — AB (ref 70–99)
Glucose-Capillary: 197 mg/dL — ABNORMAL HIGH (ref 70–99)
Glucose-Capillary: 98 mg/dL (ref 70–99)

## 2017-12-08 MED ORDER — NICOTINE 14 MG/24HR TD PT24
14.0000 mg | MEDICATED_PATCH | Freq: Every day | TRANSDERMAL | Status: DC
  Administered 2017-12-08 – 2017-12-14 (×7): 14 mg via TRANSDERMAL
  Filled 2017-12-08 (×8): qty 1

## 2017-12-08 MED ORDER — CALCIUM ACETATE (PHOS BINDER) 667 MG PO CAPS
667.0000 mg | ORAL_CAPSULE | Freq: Three times a day (TID) | ORAL | Status: DC
  Administered 2017-12-08 – 2017-12-10 (×5): 667 mg via ORAL
  Filled 2017-12-08 (×5): qty 1

## 2017-12-08 MED ORDER — CALCITRIOL 0.25 MCG PO CAPS
0.2500 ug | ORAL_CAPSULE | ORAL | Status: DC
  Administered 2017-12-11 – 2017-12-16 (×4): 0.25 ug via ORAL
  Filled 2017-12-08 (×2): qty 1

## 2017-12-08 MED ORDER — HEPARIN SODIUM (PORCINE) 1000 UNIT/ML IJ SOLN
INTRAMUSCULAR | Status: AC
  Filled 2017-12-08: qty 4

## 2017-12-08 MED ORDER — HEPARIN SODIUM (PORCINE) 1000 UNIT/ML DIALYSIS
20.0000 [IU]/kg | INTRAMUSCULAR | Status: DC | PRN
  Filled 2017-12-08: qty 1

## 2017-12-08 NOTE — Discharge Summary (Addendum)
Physician Discharge Summary  Patient ID: Erica [name] MRN: [id number] DOB/AGE: February 09, 1956 62 y.o.  Admit date: 11/29/2017 Discharge date: 12/16/2017  Discharge Diagnoses:  Principal Problem:   Stroke [facility]) Active Problems:   Moderate protein malnutrition (HCC)   Acute renal failure superimposed on chronic kidney disease ([place])   Diabetes mellitus type 2 in nonobese (HCC)   Anemia of chronic disease   History of hydronephrosis --stents in place   Discharged Condition: stable   Significant Diagnostic Studies: Dg Lumbar Spine 2-3 Views  Result Date: 12/08/2017 CLINICAL DATA:  Neurogenic claudication EXAM: LUMBAR SPINE - 2-3 VIEW COMPARISON:  CT 11/08/2017 FINDINGS: Bilateral ureteral stents are noted in place. Calcifications throughout the pancreas. Moderate to severe compression deformities noted at T10, T12, and the superior endplate of L2. These findings are stable since prior CT. Slight anterolisthesis of L4 on L5 related to facet disease, stable. Disc spaces maintained. SI joints symmetric and unremarkable. IMPRESSION: Stable chronic compression fractures at T10, T12 and superior endplate of L2. Degenerative facet disease with grade 1 anterolisthesis at L4-5. Chronic pancreatitis changes. Electronically Signed   By: Rolm Baptise M.D.   On: 12/08/2017 21:43   Mr Erica [name] Head Wo Contrast  Result Date: 11/17/2017 CLINICAL DATA:  Confusion for 2-3 days. History of hypertension, diabetes and seizures. EXAM: MRA HEAD WITHOUT CONTRAST TECHNIQUE: Angiographic images of the Circle of Willis were obtained using MRA technique without intravenous contrast. COMPARISON:  MRI head November 15, 2017 FINDINGS: Mild motion degraded examination. ANTERIOR CIRCULATION: Normal flow related enhancement of the included cervical, petrous, cavernous and supraclinoid internal carotid arteries. Patent anterior communicating artery. Patent anterior and middle cerebral arteries, mild luminal irregularity. No large  vessel occlusion, flow limiting stenosis, aneurysm. POSTERIOR CIRCULATION: RIGHT vertebral artery is dominant. Vertebrobasilar arteries are patent, with normal flow related enhancement of the main branch vessels. Stenotic RIGHT P1 segment with predominant RIGHT vascular supply provided by posterior communicating artery. Small LEFT posterior communicating artery present. Patent posterior cerebral arteries, mild luminal irregularity. No large vessel occlusion, flow limiting stenosis,  aneurysm. ANATOMIC VARIANTS: None. Source images and MIP images were reviewed. IMPRESSION: 1. Motion degraded examination. No emergent large vessel occlusion or flow-limiting stenosis. 2. Mild intracranial atherosclerosis versus motion artifact. Electronically Signed   By: Elon Alas M.D.   On: 11/17/2017 22:24   Ir Fluoro Guide Cv Line Right  Result Date: 11/23/2017 INDICATION: Right jugular temporary dialysis catheter in place. Permanent dialysis catheter placement requested. EXAM: RIGHT HEMODIALYSIS CATHETER TEMPORARY TO TUNNELED CONVERSION MEDICATIONS: Ancef 2 g; The antibiotic was administered within an appropriate time interval prior to skin puncture. ANESTHESIA/SEDATION: Moderate (conscious) sedation was employed during this procedure. A total of Versed 1 mg and Fentanyl 50 mcg was administered intravenously. Moderate Sedation Time: 11 minutes. The patient's level of consciousness and vital signs were monitored continuously by radiology nursing throughout the procedure under my direct supervision. FLUOROSCOPY TIME:  Fluoroscopy Time:  minutes 36 seconds (1 mGy). COMPLICATIONS: None immediate. PROCEDURE: Informed written consent was obtained from the patient after a thorough discussion of the procedural risks, benefits and alternatives. All questions were addressed. Maximal Sterile Barrier Technique was utilized including caps, mask, sterile gowns, sterile gloves, sterile drape, hand hygiene and skin antiseptic. A  timeout was performed prior to the initiation of the procedure. The right neck was prepped and draped in a sterile fashion. 1% lidocaine was utilized for local anesthesia. The temporary dialysis catheter was removed over an Amplatz wire. An incision was made in the upper  chest. A 19 cm tip to cuff dialysis catheter was advanced from the chest incision out the neck entry site. A peel-away sheath was advanced over the Amplatz wire. The leading edge of the dialysis catheter was advanced through the peel-away sheath. The peel-away sheath was removed. The neck incision was closed with a 4 0 Vicryl subcuticular stitch. The chest incision was closed with an 0 Prolene pursestring. It was flushed and instilled with heparinized saline. FINDINGS: Tip of the tunneled dialysis catheter is at the cavoatrial junction. IMPRESSION: Successful right jugular temporary to tunneled dialysis catheter conversion. Electronically Signed   By: Marybelle Killings M.D.   On: 11/23/2017 07:11   Ir Fluoro Guide Cv Line Right  Result Date: 11/17/2017 INDICATION: End-stage renal disease. Please perform image guided placement of a temporary dialysis catheter for the initiation dialysis. EXAM: NON-TUNNELED CENTRAL VENOUS HEMODIALYSIS CATHETER PLACEMENT WITH ULTRASOUND AND FLUOROSCOPIC GUIDANCE COMPARISON:  None. MEDICATIONS: None FLUOROSCOPY TIME:  18 seconds (2 mGy) COMPLICATIONS: None immediate. PROCEDURE: Informed written consent was obtained from the patient after a discussion of the risks, benefits, and alternatives to treatment. Questions regarding the procedure were encouraged and answered. The right neck and chest were prepped with chlorhexidine in a sterile fashion, and a sterile drape was applied covering the operative field. Maximum barrier sterile technique with sterile gowns and gloves were used for the procedure. A timeout was performed prior to the initiation of the procedure. After the overlying soft tissues were anesthetized, a small  venotomy incision was created and a micropuncture kit was utilized to access the internal jugular vein. Real-time ultrasound guidance was utilized for vascular access including the acquisition of a permanent ultrasound image documenting patency of the accessed vessel. The microwire was utilized to measure appropriate catheter length. A stiff glidewire was advanced to the level of the IVC. Under fluoroscopic guidance, the venotomy was serially dilated, ultimately allowing placement of a 20 cm temporary Trialysis catheter with tip ultimately terminating within the superior aspect of the right atrium. Final catheter positioning was confirmed and documented with a spot radiographic image. The catheter aspirates and flushes normally. The catheter was flushed with appropriate volume heparin dwells. The catheter exit site was secured with a 0-Prolene retention suture. A dressing was placed. The patient tolerated the procedure well without immediate post procedural complication. IMPRESSION: Successful placement of a right internal jugular approach 20 cm temporary dialysis catheter with tip terminating with in the superior aspect of the right atrium. The catheter is ready for immediate use. PLAN: This catheter may be converted to a tunneled dialysis catheter at a later date as indicated. Electronically Signed   By: Sandi Mariscal M.D.   On: 11/17/2017 12:06   Ir US Guide Vasc Access Right  Result Date: 11/17/2017 INDICATION: End-stage renal disease. Please perform image guided placement of a temporary dialysis catheter for the initiation dialysis. EXAM: NON-TUNNELED CENTRAL VENOUS HEMODIALYSIS CATHETER PLACEMENT WITH ULTRASOUND AND FLUOROSCOPIC GUIDANCE COMPARISON:  None. MEDICATIONS: None FLUOROSCOPY TIME:  18 seconds (2 mGy) COMPLICATIONS: None immediate. PROCEDURE: Informed written consent was obtained from the patient after a discussion of the risks, benefits, and alternatives to treatment. Questions regarding the  procedure were encouraged and answered. The right neck and chest were prepped with chlorhexidine in a sterile fashion, and a sterile drape was applied covering the operative field. Maximum barrier sterile technique with sterile gowns and gloves were used for the procedure. A timeout was performed prior to the initiation of the procedure. After the overlying soft tissues  were anesthetized, a small venotomy incision was created and a micropuncture kit was utilized to access the internal jugular vein. Real-time ultrasound guidance was utilized for vascular access including the acquisition of a permanent ultrasound image documenting patency of the accessed vessel. The microwire was utilized to measure appropriate catheter length. A stiff glidewire was advanced to the level of the IVC. Under fluoroscopic guidance, the venotomy was serially dilated, ultimately allowing placement of a 20 cm temporary Trialysis catheter with tip ultimately terminating within the superior aspect of the right atrium. Final catheter positioning was confirmed and documented with a spot radiographic image. The catheter aspirates and flushes normally. The catheter was flushed with appropriate volume heparin dwells. The catheter exit site was secured with a 0-Prolene retention suture. A dressing was placed. The patient tolerated the procedure well without immediate post procedural complication. IMPRESSION: Successful placement of a right internal jugular approach 20 cm temporary dialysis catheter with tip terminating with in the superior aspect of the right atrium. The catheter is ready for immediate use. PLAN: This catheter may be converted to a tunneled dialysis catheter at a later date as indicated. Electronically Signed   By: Sandi Mariscal M.D.   On: 11/17/2017 12:06   Dg Swallowing Func-speech Pathology  Result Date: 11/19/2017 Objective Swallowing Evaluation: Type of Study: MBS-Modified Barium Swallow Study  Patient Details Name: CAMDYN LADEN MRN: [id number] Date of Birth: 25-Jan-1956 Today's Date: 11/19/2017 Time: SLP Start Time (ACUTE ONLY): 7681 -SLP Stop Time (ACUTE ONLY): 0941 SLP Time Calculation (min) (ACUTE ONLY): 19 min Past Medical History: Past Medical History: Diagnosis Date . Alcohol-induced pancreatitis  . Chronic diarrhea  . Depression  . Diabetes mellitus   fasting blood sugar 110-120s . Diastolic CHF ([place])  . DKA (diabetic ketoacidoses) ([place])  . Gastroparesis  . GERD (gastroesophageal reflux disease)  . Heart murmur  . History of kidney stones  . Hyperlipidemia  . Hypertension  . Hypokalemia  . Muscle spasm  . Neuropathic pain  . Neuropathy   Hx: of . Pyelonephritis  . Recurrent pancreatitis  . Seizures ([place])  . Vitamin B12 deficiency  . Vitamin D deficiency  Past Surgical History: Past Surgical History: Procedure Laterality Date . CATARACT EXTRACTION W/PHACO Right 03/14/2015  Procedure: CATARACT EXTRACTION PHACO AND INTRAOCULAR LENS PLACEMENT (IOC);  Surgeon: Baruch Goldmann, MD;  Location: [facility];  Service: Ophthalmology;  Laterality: Right;  CDE:11.13 . CATARACT EXTRACTION W/PHACO Left 04/11/2015  Procedure: CATARACT EXTRACTION PHACO AND INTRAOCULAR LENS PLACEMENT LEFT EYE CDE=9.68;  Surgeon: Baruch Goldmann, MD;  Location: [facility];  Service: Ophthalmology;  Laterality: Left; . COLONOSCOPY  02/24/2010 . cystoscopy with ureteral stent  Bilateral 10/07/2017  At [place] CV LINE RIGHT  11/17/2017 . IR US GUIDE VASC ACCESS RIGHT  11/17/2017 . MULTIPLE EXTRACTIONS WITH ALVEOLOPLASTY N/A 06/13/2012  Procedure: MULTIPLE EXTRACION WITH ALVEOLOPLASTY EXTRACT: 18, 19, 20, 21, 22, 24, 25, 27, 28, 29, 30, 31;  Surgeon: Gae Bon, DDS;  Location: [facility];  Service: Oral Surgery;  Laterality: N/A; . TUBAL LIGATION   . URETERAL STENT PLACEMENT  09/2017 HPI: 62 year old female admitted 11/08/17 with weakness. [facility]/15/27 due to seizure activity. PMH: DM2, HTN, seizure disorder, alcohol induced pancreatitis,  GERD, HLD. MRI = Scattered punctate foci of acute infarction, c/w watershed infarction. MBS recommended to objectively assess the swallowing function and determine LRD secondary to overt coughing on thin liquids and coughing with D2 textures following thin liquids during MBS  11/18/2017.  Subjective: Pt resting in bed. No family present Assessment / Plan / Recommendation CHL IP CLINICAL IMPRESSIONS 11/19/2017 Clinical Impression Pt presents with oropharyngeal function largely within functional limits with consistent and adequate glottic competence and airway protection noted throughout study. Pt is edentulous and presents with baseline cognitive deficits likely causing noted mildly prolonged bolus manipulation and transit time across consistencies and some lingual pumping. Mildly delayed swallow trigger to the level of the valleculae with solid textures and occasional premature spillage to the pyriforms with mixed consistencies and thin liquids when pressed to take large tablespoon bites of mixed consistencies or consecutive straw sips of thin liquids. Flash penetration of thin liquids reflexively ejected by the end of the swallow on all instances. Pt did not cough during the exam in contrast to presentation at bedside yesterday with the exception of one brief coughing episode with initial tsp trial of mixed consistency that was not visualized; no difficulty was noted with larger additional bites. Secondary to edentulous status recommend continue with dysphagia 2 diet/fine chop and upgrade to thin liquids. ST will follow up X1 for clinical diet check and to review aspiration precautions re: utlize slow rate, small sips and alternate bites and sips prior to signing off. SLP Visit Diagnosis Dysphagia, unspecified (R13.10) Attention and concentration deficit following -- Frontal lobe and executive function deficit following -- Impact on safety and function Mild aspiration risk   CHL IP TREATMENT RECOMMENDATION 11/19/2017  Treatment Recommendations Therapy as outlined in treatment plan below   Prognosis 11/19/2017 Prognosis for Safe Diet Advancement Fair Barriers to Reach Goals Cognitive deficits Barriers/Prognosis Comment -- CHL IP DIET RECOMMENDATION 11/19/2017 SLP Diet Recommendations Dysphagia 2 (Fine chop) solids;Thin liquid Liquid Administration via Straw;Cup Medication Administration Whole meds with liquid Compensations Minimize environmental distractions;Small sips/bites;Slow rate;Follow solids with liquid Postural Changes Remain semi-upright after after feeds/meals (Comment);Seated upright at 90 degrees   CHL IP OTHER RECOMMENDATIONS 11/19/2017 Recommended Consults -- Oral Care Recommendations Oral care BID Other Recommendations --   CHL IP FOLLOW UP RECOMMENDATIONS 11/19/2017 Follow up Recommendations None   CHL IP FREQUENCY AND DURATION 11/19/2017 Speech Therapy Frequency (ACUTE ONLY) min 1 x/week Treatment Duration 2 weeks      CHL IP ORAL PHASE 11/19/2017 Oral Phase Impaired Oral - Pudding Teaspoon -- Oral - Pudding Cup -- Oral - Honey Teaspoon -- Oral - Honey Cup -- Oral - Nectar Teaspoon -- Oral - Nectar Cup -- Oral - Nectar Straw -- Oral - Thin Teaspoon WFL Oral - Thin Cup WFL Oral - Thin Straw Lingual pumping;Premature spillage Oral - Puree Lingual pumping;Reduced posterior propulsion Oral - Mech Soft Lingual pumping;Reduced posterior propulsion Oral - Regular -- Oral - Multi-Consistency -- Oral - Pill WFL Oral Phase - Comment --  CHL IP PHARYNGEAL PHASE 11/19/2017 Pharyngeal Phase -- Pharyngeal- Pudding Teaspoon -- Pharyngeal -- Pharyngeal- Pudding Cup -- Pharyngeal -- Pharyngeal- Honey Teaspoon -- Pharyngeal -- Pharyngeal- Honey Cup -- Pharyngeal -- Pharyngeal- Nectar Teaspoon -- Pharyngeal -- Pharyngeal- Nectar Cup -- Pharyngeal -- Pharyngeal- Nectar Straw -- Pharyngeal -- Pharyngeal- Thin Teaspoon -- Pharyngeal -- Pharyngeal- Thin Cup -- Pharyngeal -- Pharyngeal- Thin Straw Delayed swallow initiation-pyriform  sinuses;Penetration/Aspiration before swallow Pharyngeal Material enters airway, remains ABOVE vocal cords then ejected out Pharyngeal- Puree WFL Pharyngeal -- Pharyngeal- Mechanical Soft -- Pharyngeal -- Pharyngeal- Regular WFL Pharyngeal -- Pharyngeal- Multi-consistency -- Pharyngeal -- Pharyngeal- Pill WFL Pharyngeal -- Pharyngeal Comment --  No flowsheet data found. Amelia H. Roddie Mc, CCC-SLP Speech Language Pathologist Wende Bushy 11/19/2017,  11:31 AM              Vas US Carotid  Result Date: 11/17/2017 Carotid Arterial Duplex Study Indications: CVA. Limitations: patient positioning Performing Technologist: Abram Sander  Examination Guidelines: A complete evaluation includes B-mode imaging, spectral Doppler, color Doppler, and power Doppler as needed of all accessible portions of each vessel. Bilateral testing is considered an integral part of a complete examination. Limited examinations for reoccurring indications may be performed as noted.  Right Carotid Findings: +----------+--------+--------+--------+-----------+--------------+           PSV cm/sEDV cm/sStenosisDescribe   Comments       +----------+--------+--------+--------+-----------+--------------+ CCA Prox  67      7               homogeneous               +----------+--------+--------+--------+-----------+--------------+ CCA Distal45      15              homogeneous               +----------+--------+--------+--------+-----------+--------------+ ICA Prox  105     34      1-39%   homogeneous               +----------+--------+--------+--------+-----------+--------------+ ICA Distal                                   not visualized +----------+--------+--------+--------+-----------+--------------+ ECA       47      7                                         +----------+--------+--------+--------+-----------+--------------+ +---------+--------+--+--------+-+---------+ VertebralPSV cm/s47EDV  cm/s8Antegrade +---------+--------+--+--------+-+---------+  Left Carotid Findings: +----------+--------+--------+--------+-----------+--------+           PSV cm/sEDV cm/sStenosisDescribe   Comments +----------+--------+--------+--------+-----------+--------+ CCA Prox  101     21              homogeneous         +----------+--------+--------+--------+-----------+--------+ CCA Distal63      12              homogeneous         +----------+--------+--------+--------+-----------+--------+ ICA Prox  65      17      1-39%   homogeneous         +----------+--------+--------+--------+-----------+--------+ ICA Distal87      21                                  +----------+--------+--------+--------+-----------+--------+ ECA       88      13                                  +----------+--------+--------+--------+-----------+--------+ +---------+--------+--+--------+-+---------+ VertebralPSV cm/s54EDV cm/s9Antegrade +---------+--------+--+--------+-+---------+  Final Interpretation: Right Carotid: Velocities in the right ICA are consistent with a 1-39% stenosis. Left Carotid: Velocities in the left ICA are consistent with a 1-39% stenosis. Vertebrals: Bilateral vertebral arteries demonstrate antegrade flow. *See table(s) above for measurements and observations.  Electronically signed by Antony Contras MD on 11/17/2017 at 1:15:44 PM.    Final     Labs:  Basic Metabolic Panel: Recent Labs  Lab 12/10/17  1303 12/11/17 1330 12/14/17 0515 12/14/17 1440 12/15/17 0614 12/16/17 0729  NA 132* 130*  --  132* 134* 130*  K 4.3 3.8  --  4.0 3.7 4.0  CL 97* 96*  --  96* 101 98  CO2 20* 21*  --  20* 21* 19*  GLUCOSE 242* 203*  --  143* 59* 304*  BUN 44* 70*  --  77* 27* 45*  CREATININE 4.82* 6.02*  --  6.09* 3.04* 4.64*  CALCIUM 9.2 8.6*  --  9.0 9.0 8.9  PHOS 7.8* 7.5* 8.3* 8.1*  --  7.9*    CBC: Recent Labs  Lab 12/14/17 1440 12/15/17 0614 12/16/17 0729  WBC 8.5  9.5 10.0  HGB 10.1* 11.0* 10.3*  HCT 33.9* 36.8 34.5*  MCV 97.7 98.4 99.1  PLT 275 259 244    CBG: Recent Labs  Lab 12/15/17 1154 12/15/17 1641 12/15/17 2114 12/16/17 0630 12/16/17 1131  GLUCAP 98 273* 173* 279* 157*    Brief HPI:   Erica [name] is a 47 with gastropathy and neuropathy, HTN, seizure disorder, bilateral hydronephrosis S/P stent who was admitted via [facility] hospital on 11/08/2017 with acute on chronic renal failure with uremia, encephalopathy weakness and UTI.  Dr. Jeffie Pollock was consulted for input and felt that patient without evidence of obstruction and to follow-up with urology for stent removal past discharge.  He recommended continuing antibiotic coverage due to recurrent E. coli UTI and stents in place.  Hospital course significant for seizure activity on 9/29 during him hemodialysis and she was loaded up with Keppra with addition of Topamax.  Dilantin was added due to his partial status epilepticus and MRI of brain done revealing multifocal infarct.  Neurology felt that stroke was due to hypoperfusion and no further medical modification needed.  She is also had issues with anemia requiring transfusion and was requiring hemodialysis on intermittent basis.  Therapy was initiated and patient was noted to be limited by cognitive deficits with delayed processing, ataxia as well as weakness.  CIR was recommended for follow-up therapy  Hospital Course: Erica [name] was admitted to rehab 11/29/2017 for inpatient therapies to consist of PT, ST and OT at least three hours five days a week. Past admission physiatrist, therapy team and rehab RN have worked together to provide customized collaborative inpatient rehab.  Her urine output remained variable and she required intermittent hemodialysis initially.  She has been monitored for signs of overload with daily weights.  Her current serum creatinine tended to rise upwards without hemodialysis therefore it was decided that she  would be hemodialysis dependent.  Currently she is set up for Tuesday Thursday Saturday schedule at [facility] in [place].  Left AV Gore-Tex graft was placed by Dr. Donnetta Hutching on 10/30.  Diabetes has been monitored on AC at bedtime basis and low-dose Lantus was resumed.  This is been titrated upwards for tighter blood sugar control.  Her p.o. intake has improved with resolution of lethargy.  She has been seizure-free on current regimen of Keppra, Dilantin and Topamax.  Pharmacy has been assisting and Dilantin doses.  Last phenytoin dose was 4.3 on 1024 which corrects to approximately 13 due to low albumin and new chronic hemodialysis.  She is to continue on Dilantin 150 mg p.o. twice daily with monitoring of phenytoin levels as needed.  Routine labs have been done with hemodialysis TTS and anemia of chronic disease has been treated with Aranesp.  PhosLo was added as a binder with improvement in phosphorus from  8.3 to 7.9.  Patient has made good gains and is currently at supervision level for mobility and ADLs.  She continues to require min to mod assist for cognitive tasks.  Family is unable to provide care needed and has elected on SNF for further therapies.  Patient was discharged to [facility] skilled facility for progressive therapies on 12/16/2017   Rehab course: During patient's stay in rehab weekly team conferences were held to monitor patient's progress, set goals and discuss barriers to discharge. At admission, patient required min assist with mobility and with basic self-care task.  She exhibited severe cognitive linguistic impairments with delayed processing. Speech therapy signed off on 10/17 after discussion with family.  Patient required more than min assist at baseline per son's risk report and cognitive deficits likely due to history of alcohol abuse.  She  has had improvement in activity tolerance, balance, postural control as well as ability to compensate for deficits.  She is able to complete ADL  tasks with supervision.  She requires supervision for transfers and to ambulate 150 feet with rolling walker.  No family was available for education.  24 supervision is recommended cognitive assistance for medication and financial issues.    Disposition:  Skilled Nursing Facility  Diet: Renal diet/Carb Modified medium.  Special Instructions: 1. Monitor weights daily. Needs strict I/O. 2. Monitor BS ac/hs and use SSI per protocol. 3. Needs to follow up with urology at [facility] for stent removal.   Discharge Instructions    Ambulatory referral to Physical Medicine Rehab   Complete by:  As directed    4-6 weeks follow up appointment     Allergies as of 12/16/2017      Reactions   Aspirin Palpitations      Medication List    TAKE these medications   acetaminophen 325 MG tablet Commonly known as:  TYLENOL Take 1-2 tablets (325-650 mg total) by mouth every 4 (four) hours as needed for mild pain.   calcitRIOL 0.25 MCG capsule Commonly known as:  ROCALTROL Take 1 capsule (0.25 mcg total) by mouth Every Tuesday,Thursday,and Saturday with dialysis. Start taking on:  12/18/2017   carvedilol 12.5 MG tablet Commonly known as:  COREG Take 1 tablet (12.5 mg total) by mouth 2 (two) times daily with a meal.   cephALEXin 250 MG capsule Commonly known as:  KEFLEX Take 1 capsule (250 mg total) by mouth 2 (two) times daily for 10 days.   Chlorhexidine Gluconate Cloth 2 % Pads Apply 6 each topically daily at 6 (six) AM. Start taking on:  12/17/2017   Darbepoetin Alfa 150 MCG/0.3ML Sosy injection Commonly known as:  ARANESP Inject 0.3 mLs (150 mcg total) into the skin every Thursday at 6pm. Start taking on:  12/23/2017   feeding supplement (NEPRO CARB STEADY) Liqd Take 237 mLs by mouth 2 (two) times daily between meals.   ferric citrate 1 GM 210 MG(Fe) tablet Commonly known as:  AURYXIA Take 2 tablets (420 mg total) by mouth 3 (three) times daily with meals.   insulin aspart 100  UNIT/ML injection Commonly known as:  novoLOG Inject 0-9 Units into the skin 3 (three) times daily with meals. For glucose 121-150 use one unit, for 151-200 use 2 units, for 201 to 250 use 3 units, for 251-300 use 5 units, for 301-350 use 7 units, for 351 or greater use 9 unnits.   insulin glargine 100 UNIT/ML injection Commonly known as:  LANTUS Inject 0.12 mLs (12 Units total) into the skin  daily. Start taking on:  12/17/2017 What changed:  how much to take   levETIRAcetam 500 MG tablet Commonly known as:  KEPPRA Take 1 tablet (500 mg total) by mouth Every Tuesday,Thursday,and Saturday with dialysis. What changed:  when to take this   levETIRAcetam 1000 MG tablet Commonly known as:  KEPPRA Take 1 tablet (1,000 mg total) by mouth daily. Start taking on:  12/17/2017 What changed:  Another medication with the same name was changed. Make sure you understand how and when to take each.   multivitamin Tabs tablet Take 1 tablet by mouth at bedtime.   nicotine 14 mg/24hr patch Commonly known as:  NICODERM CQ - dosed in mg/24 hours Place 1 patch (14 mg total) onto the skin daily. Start taking on:  12/17/2017   phenytoin 125 MG/5ML suspension Commonly known as:  DILANTIN Take 6 mLs (150 mg total) by mouth 2 (two) times daily. What changed:  how much to take   polyethylene glycol packet Commonly known as:  MIRALAX / GLYCOLAX Take 17 g by mouth daily as needed for mild constipation.   saccharomyces boulardii 250 MG capsule Commonly known as:  FLORASTOR Take 1 capsule (250 mg total) by mouth 2 (two) times daily.   topiramate 25 MG tablet Commonly known as:  TOPAMAX Take 1 tablet (25 mg total) by mouth 2 (two) times daily.   traZODone 50 MG tablet Commonly known as:  DESYREL Take 0.5-1 tablets (25-50 mg total) by mouth at bedtime as needed for sleep.       Contact information for follow-up providers    Octavio Graves, DO Follow up.   Contact information: [address] [address] [phone]        Rosetta Posner, MD Follow up in 2 week(s).   Specialties:  Vascular Surgery, Cardiology Why:  office will call the patient Contact information: [place] [address] [phone]            Contact information for after-discharge care    Destination    [facility] .   Service:  Skilled Nursing Contact information: [place] [phone]                  Signed: Bary Leriche 12/16/2017, 12:50 PM

## 2017-12-08 NOTE — Progress Notes (Signed)
Physical Therapy Note  Patient Details  Name: Erica [name] MRN: [id number] Date of Birth: January 02, 1956 Today's Date: 12/08/2017    Pt missed 30 minutes of scheduled PT treatment 2/2 unexpected dialysis appointment & pt being off unit. Will f/u per POC.   [name] 12/08/2017, 2:08 PM

## 2017-12-08 NOTE — Progress Notes (Signed)
Occupational Therapy Session Note  Patient Details  Name: Erica [name] MRN: [id number] Date of Birth: [birth year]  Today's Date: 12/08/2017 OT Individual Time: 1000-1055 OT Individual Time Calculation (min): 55 min    Short Term Goals: Week 1:  OT Short Term Goal 1 (Week 1): Pt will complete LB dressing with supervision OT Short Term Goal 2 (Week 1): Pt will complete bathing with supervision at sit > stand level OT Short Term Goal 3 (Week 1): Pt will complete toilet transfers ambulating with LRAD OT Short Term Goal 4 (Week 1): Pt will complete 2 grooming tasks in standing to increase activity tolerance   Skilled Therapeutic Interventions/Progress Updates:    OT intervention with focus on BADL retraining including bathing/dressing with sit<>stand from w/c at sink, toileting, functional transfers, functional amb with RW in room, activity tolerance, and safety awareness to increase independence with BADLs. Pt amb in room to gather clothing prior to completing bathing/dressing tasks at sink.  Pt completed all tasks and toileting at supervision level. Pt requires more than a reasonable amount of time to complete tasks.  Pt remained in recliner with belt alarm activated and all needs within reach.   Therapy Documentation Precautions:  Precautions Precautions: Fall Precaution Comments: seizures Restrictions Weight Bearing Restrictions: No  Pain: Pain Assessment Pain Scale: 0-10 Pain Score: 0-No pain     Therapy/Group: Individual Therapy  Leroy Libman 12/08/2017, 11:27 AM

## 2017-12-08 NOTE — Progress Notes (Signed)
Physical Therapy Weekly Progress Note  Patient Details  Name: Erica [name] MRN: [id number] Date of Birth: 1955-03-10  Beginning of progress report period: November 30, 2017 End of progress report period: December 08, 2017  Today's Date: 12/08/2017  Patient has met 1 of 2 short term goals.  Pt is making good progress and is currently at a supervision level with RW for all mobility. Pt is limited by LLE weakness & tingling with long distance gait, as well as impaired cognition, requiring cuing for sequencing turns for safety. Pt would benefit from continued skilled PT treatment to focus on endurance, strengthening, balance, and pt/family education prior to d/c.   Patient continues to demonstrate the following deficits muscle weakness, decreased cardiorespiratoy endurance, decreased coordination, decreased attention to left, decreased awareness, decreased problem solving, decreased safety awareness, decreased memory and delayed processing, and decreased standing balance, decreased postural control, hemiplegia and decreased balance strategies and therefore will continue to benefit from skilled PT intervention to increase functional independence with mobility.  Patient progressing toward long term goals..  Continue plan of care.  PT Short Term Goals Week 1:  PT Short Term Goal 1 (Week 1): Pt will ambulate 75 ft with LRAD & supervision.  PT Short Term Goal 1 - Progress (Week 1): Met PT Short Term Goal 2 (Week 1): Pt will negotiate stairs with 1 rail and min assist for strengtening & balance. PT Short Term Goal 2 - Progress (Week 1): Progressing toward goal Week 2:  PT Short Term Goal 1 (Week 2): STG = LTG due to anticipated d/c.    Therapy Documentation Precautions:  Precautions Precautions: Fall Precaution Comments: seizures Restrictions Weight Bearing Restrictions: No   Therapy/Group: Individual Therapy  [name] 12/08/2017, 12:25 PM

## 2017-12-08 NOTE — Progress Notes (Signed)
Physical Therapy Note  Patient Details  Name: Erica [name] MRN: [id number] Date of Birth: 03/09/1955 Today's Date: 12/08/2017    Pt's plan of care adjusted to QD after speaking with care team and discussed with MD in team conference.     [name] 12/08/2017, 10:59 AM

## 2017-12-08 NOTE — Progress Notes (Signed)
[facility] PHYSICAL MEDICINE & REHABILITATION PROGRESS NOTE   Subjective/Complaints:  No new issues overnite  ROS: Patient denies  nausea, vomiting, diarrhea, cough, shortness of breath or chest pain, joint or back pain,   Objective:   No results found. No results for input(s): WBC, HGB, HCT, PLT in the last 72 hours. Recent Labs    12/06/17 0622 12/07/17 0501  NA 132* 131*  K 3.7 3.4*  CL 98 96*  CO2 23 22  GLUCOSE 112* 183*  BUN 38* 49*  CREATININE 4.37* 5.38*  CALCIUM 8.5* 8.6*    Intake/Output Summary (Last 24 hours) at 12/08/2017 0939 Last data filed at 12/07/2017 1823 Gross per 24 hour  Intake 360 ml  Output 400 ml  Net -40 ml     Physical Exam: Vital Signs Blood pressure (!) 158/74, pulse 88, temperature 99.4 F (37.4 C), temperature source Oral, resp. rate 15, height 5' (1.524 m), weight 46.9 kg, SpO2 96 %. Constitutional: No distress . Vital signs reviewed. HEENT: EOMI, oral membranes moist Neck: supple Cardiovascular: RRR without murmur. No JVD    Respiratory: CTA Bilaterally without wheezes or rales. Normal effort    GI: BS +, non-tender, non-distended  Musculoskeletal:  No edema or tenderness in extremities  Neurological: She is alert.  Speech fairly clear. Follows basic commands.  Motor: motor 4/5 throughout LUE ataxia Skin: Skin is warm and dry.  Psychiatric: cooperative and pleasant     Assessment/Plan: 1. 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  Physiatrist is providing close team supervision and 24 hour management of active medical problems listed below.  Physiatrist and rehab team continue to assess barriers to discharge/monitor patient progress toward functional and medical goals  Care Tool:  Bathing  Bathing activity did not occur: Refused Body parts bathed by patient: Right arm, Left  arm, Chest, Abdomen, Right upper leg, Face, Left upper leg, Front perineal area, Buttocks, Right lower leg, Left lower leg         Bathing assist Assist Level: Supervision/Verbal cueing     Upper Body Dressing/Undressing Upper body dressing Upper body dressing/undressing activity did not occur (including orthotics): Refused What is the patient wearing?: Pull over shirt    Upper body assist Assist Level: Independent with assistive device    Lower Body Dressing/Undressing Lower body dressing    Lower body dressing activity did not occur: Refused What is the patient wearing?: Pants     Lower body assist Assist for lower body dressing: Supervision/Verbal cueing     Toileting Toileting    Toileting assist Assist for toileting: Supervision/Verbal cueing     Transfers Chair/bed transfer  Transfers assist     Chair/bed transfer assist level: Supervision/Verbal cueing(RW)     Locomotion Ambulation   Ambulation assist      Assist level: Supervision/Verbal cueing Assistive device: Walker-rolling Max distance: 150 ft    Walk 10 feet activity   Assist     Assist level: Supervision/Verbal cueing Assistive device: Walker-rolling   Walk 50 feet activity   Assist    Assist level: Supervision/Verbal cueing Assistive device: Walker-rolling    Walk 150 feet activity   Assist Walk 150 feet activity did not occur: Safety/medical concerns  Assist level: Supervision/Verbal cueing Assistive device: Walker-rolling    Walk 10 feet on uneven surface  activity   Assist     Assist level: Supervision/Verbal cueing Assistive device: Walker-rolling     Wheelchair     Assist Will patient use wheelchair at discharge?: No   Wheelchair activity did not occur: Safety/medical concerns         Wheelchair 50 feet with 2 turns activity    Assist    Wheelchair 50 feet with 2 turns activity did not occur: Safety/medical concerns       Wheelchair 150  feet activity     Assist Wheelchair 150 feet activity did not occur: Safety/medical concerns        Medical Problem List and Plan: 1.  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    ---Continue CIR therapies including PT, OT, and SLP  Team conference today please see physician documentation under team conference tab, met with team face-to-face to discuss problems,progress, and goals. Formulized individual treatment plan based on medical history, underlying problem and comorbidities. 2.  DVT Prophylaxis/Anticoagulation: Pharmaceutical: Lovenox 3. Pain Management: tylenol prn.  4. Mood: LCSW to follow for evaluation and support.  5. Neuropsych: This patient is not fully capable of making decisions on her own behalf. 6. Skin/Wound Care: Routine pressure relief measures measures.  - added prn benadryl at night to help with itching 7. Fluids/Electrolytes/Nutrition: Strict I's and O's.   - Labs with dialysis. 8.  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   Pharmacy dosing on DPH  -Neurology recommended to increase Topamax to 50 mg twice daily (on 25mg currently)---no change at present 9.  History of hydronephrosis s/p stents and recurrent E. coli UTI: Urology with recommendations to continue antibiotics. Ongoing temps  -UA+   -ucx multispecies  -3 day course of keflex completed 10.  T2DM: Intake improving with improvement in mentation.  Low-dose Lantus resumed  .  -Increased Lantus insulin to 10 units daily ln 10/16, increase to 14units  -SSI coverage   CBG (last 3)  Recent Labs    12/07/17 1626 12/07/17 2221 12/08/17 0648  GLUCAP 103* 176* 98  controlled this am 10/22- avoid hypoglycemia, keep CBG >100  11.  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  Anemia of chronic disease:   Continue Aranesp.     LOS: 9 days A FACE TO FACE EVALUATION WAS PERFORMED   E  12/08/2017, 9:39 AM     

## 2017-12-08 NOTE — Procedures (Signed)
Patient was seen on dialysis and the procedure was supervised.  BFR 400  Via TDC BP is  158/74.   Patient appears to be tolerating treatment well  Louis Meckel 12/08/2017

## 2017-12-08 NOTE — Progress Notes (Signed)
Physical Therapy Session Note  Patient Details  Name: Erica [name] MRN: [id number] Date of Birth: 1955-03-21  Today's Date: 12/08/2017 PT Individual Time: 0807-0919 PT Individual Time Calculation (min): 72 min  Short Term Goals: Week 1:  PT Short Term Goal 1 (Week 1): Pt will ambulate 75 ft with LRAD & supervision.  PT Short Term Goal 2 (Week 1): Pt will negotiate stairs with 1 rail and min assist for strengtening & balance.  Skilled Therapeutic Interventions/Progress Updates:  Pt received in bed & agreeable to tx. No c/o pain but pt reporting itching and RN notified & meds administered. Therapist provided total assist for donning ted hose & tennis shoes for time management as pt with difficulty donning hose. Pt ambulates in room & bathroom with close supervision, performing toilet transfer & clothing management with distant supervision with continent void & peri and hand hygiene with supervision; pt only requires assistance to tighten brief. Pt with c/o BLE "they feel like they haven't woke up good" and reports tingling in LLE. Pt with intermittent impaired gait on this date, with excessive L hip/knee flexion during swing phase and minimal to no heel strike despite cuing for more normalized gait pattern. Pt continues to demonstrate impaired L inattention that was noticeable yesterday as pt frequently steers RW to L requiring max cuing to straighten AD. However, when ambulating at end of session pt with normalized gait pattern and improved management of RW. MD made aware of & observed pt's inconsistencies in gait. Pt completes car transfer at sedan simulated height with RW & supervision with cuing to sit then transfer LE in/out of car. Pt negotiates ramp & uneven surface with RW & close supervision. In apartment pt requires max cuing for sequencing turning to sit on EOB. Pt completes bed mobility with mod I. Pt switched out pillow cases for clean ones with set up assist and extra time with  adequate functional use of LUE. Pt returned to room and requires close supervision 2/2 significantly declining gait pattern towards end of session. Pt transferred to supine in bed & MD assessed pt stating he feels pt's LE numbness is coming from back issues. At end of session pt left in bed with alarm set & needs in reach. Therapist observed pt to have snuff in room, items placed in [place] [facility] Fee) made aware.   Therapy Documentation Precautions:  Precautions Precautions: Fall Precaution Comments: seizures Restrictions Weight Bearing Restrictions: No    Therapy/Group: Individual Therapy  [name] 12/08/2017, 12:19 PM

## 2017-12-08 NOTE — Progress Notes (Signed)
Physical Therapy Note  Patient Details  Name: Erica [name] MRN: [id number] Date of Birth: 1955-10-13 Today's Date: 12/08/2017    Pt off unit for unscheduled HD appointment.  Will f/u per plan of care.    Shann Medal, PT, DPT   12/08/2017, 3:44 PM

## 2017-12-08 NOTE — Patient Care Conference (Signed)
Inpatient RehabilitationTeam Conference and Plan of Care Update Date: 12/08/2017   Time: 10:45 AM    Patient Name: Erica [name]      Medical Record Number: [id number]  Date of Birth: 07-15-1955 Sex: Female         Room/Bed: 4W19C/4W19C-01 Payor Info: Payor: MEDICAID [place] / Plan: MEDICAID [place] ACCESS / Product Type: *No Product type* /    Admitting Diagnosis: CVA  Admit Date/Time:  11/29/2017  4:31 PM Admission Comments: No comment available   Primary Diagnosis:  Stroke [facility]) Principal Problem: Stroke [facility])  Patient Active Problem List   Diagnosis Date Noted  . History of hydronephrosis --stents in place 12/08/2017  . Stroke ([place]) 11/29/2017  . Seizures ([place])   . Hydronephrosis   . Recurrent UTI   . Diabetes mellitus type 2 in nonobese (HCC)   . ESRD (end stage renal disease) ([place])   . Anemia of chronic disease   . Chronic diastolic congestive heart failure ([place])   . Essential hypertension   . AKI (acute kidney injury) ([place])   . PICC (peripherally inserted central catheter) in place   . Acute pyelonephritis 11/12/2017  . Acute renal failure superimposed on chronic kidney disease ([place]) 11/10/2017  . Uncontrolled type 2 diabetes mellitus with hyperglycemia, with long-term current use of insulin ([place]) 11/10/2017  . Renal failure 11/08/2017  . Seizure disorder ([place]) 11/08/2017  . Orthostatic hypotension 07/25/2014  . Moderate protein malnutrition ([place]) 09/15/2013  . Aspiration pneumonia ([place]) 09/15/2013  . Acute respiratory failure requiring reintubation ([place]) 09/11/2013  . probable Seizures due to metabolic disorder 41/28/7867  . Lactic acidosis 03/19/2013  . Abdominal pain 03/19/2013  . Rotavirus infection 10/29/2012  . Type II or unspecified type diabetes mellitus without mention of complication, uncontrolled 10/29/2012  . Protein-calorie malnutrition, severe ([place]) 10/27/2012  . NSTEMI (non-ST elevated myocardial infarction) ([place]) 10/26/2012  . Fever, unspecified  10/26/2012  . Hypotension 10/25/2012  . Alcohol withdrawal ([place]) 10/25/2012  . Metabolic acidosis 67/20/9470  . Chronic diarrhea 10/25/2012  . Tobacco abuse 10/25/2012  . DKA (diabetic ketoacidoses) ([place]) 09/09/2012  . Dehydration 09/09/2012  . DKA, type 2 ([place]) 05/20/2012  . Abnormal LFTs 05/20/2012  . Heart murmur, systolic 96/28/3662  . Diabetes mellitus ([place]) 07/08/2011  . Hypoglycemia 07/08/2011  . Metabolic encephalopathy 94/76/5465  . Alcohol intoxication ([place]) 07/08/2011  . Alcohol abuse 07/08/2011  . Hypokalemia 07/08/2011  . Nausea & vomiting 07/08/2011  . Abdominal pain 07/08/2011  . H/O chronic pancreatitis 07/08/2011    Expected Discharge Date: Expected Discharge Date: 12/14/17  Team Members Present: Physician leading conference: Dr. Alysia Penna Social Worker Present: Ovidio Kin, LCSW Nurse Present: Dorien Chihuahua, RN PT Present: Lavone Nian, PT OT Present: Willeen Cass, OT SLP Present: Charolett Bumpers, SLP PPS Coordinator present : Daiva Nakayama, RN, CRRN     Current Status/Progress Goal Weekly Team Focus  Medical   Watershed infarct cognitive gait balance issues, having some numbness with ambulation probable spinal stenosis  Pain medical stability, reduce fall risk  Further evaluation for neurogenic claudication   Bowel/Bladder   continent of B/B LBM 10/22  remain continent of B/B with normal bowel pattern  assist to Br q2h and prn laxatives for constipation   Swallow/Nutrition/ Hydration             ADL's   supervision overall.  Impaired memory and decreased awareness and endurance  Supervision goals  dynamic standing balance, cognition, endurance, d/c planning   Mobility   supervision overall with RW, impaired  balance, endurance & strength, impaired cognition  supervision overall with LRAD  NMR, strengthening, balance, gait, transfers, stairs, safety awareness, d/c planning   Communication             Safety/Cognition/ Behavioral  Observations            Pain   denies pain this shift topamax and tylenol prn  pt will be free of pain  assess and treat prn   Skin   Rt groin incision healed   no new areas of breakdown or infection  assess q shift and prn      *See Care Plan and progress notes for long and short-term goals.     Barriers to Discharge  Current Status/Progress Possible Resolutions Date Resolved   Physician    Medical stability     Progressing towards goals, limited in her ambulation distance  Continue rehab program, discharge planning      Nursing                  PT  Decreased caregiver support;Lack of/limited family support;Behavior;Hemodialysis  son works - not sure if pt has recommended 24 hr supervision at d/c, impaired cognition              OT                  SLP                SW                Discharge Planning/Teaching Needs:  Son feels needs more therapy can not provide 24 hr supervision level. Working on getting into HD center and ALF.      Team Discussion:  Reaching goals of supervision le le therapists plan to QD. Working on getting into HD center and once this is accomplished getting into ALF due to son can not provide 24 hr supervision level at discharge. Will require HD 3x weekly. Tingling from her spinal stenosis. DC from Speech due to at baseline level.   Revisions to Treatment Plan:  Make discharge arrangements then medically stable to DC    Continued Need for Acute Rehabilitation Level of Care: The patient requires daily medical management by a physician with specialized training in physical medicine and rehabilitation for the following conditions: Daily direction of a multidisciplinary physical rehabilitation program to ensure safe treatment while eliciting the highest outcome that is of practical value to the patient.: Yes Daily medical management of patient stability for increased activity during participation in an intensive rehabilitation regime.: Yes Daily analysis  of laboratory values and/or radiology reports with any subsequent need for medication adjustment of medical intervention for : Neurological problems;Other   I attest that I was present, lead the team conference, and concur with the assessment and plan of the team.   Elease Hashimoto 12/08/2017, 12:40 PM

## 2017-12-08 NOTE — Significant Event (Signed)
Patient noted to have snuff at the bedside by physical therapist.  RN had conversation with patient regarding the hospitals no tobacco policy and patient agreed to send it home with family next time they visited.  Not long after lunch, RN entered room to find patient with snuff in black sock next to her and some tobacco residue in her mouth.  When asked, she denied using it.  RN threw away patients "spit cup" and put snuff back in drawer to give to family when they arrive, although they were the ones who brought it to her to begin with.  Algis Liming, PA notified.  Ordered nicotine patch for patient to reduce cravings.  RN educated patient on negative effects of tobacco use given her recent stroke.  Will continue to monitor.  Brita Romp, RN

## 2017-12-08 NOTE — Progress Notes (Signed)
Subjective:  400 UOP recorded- kidney function numbers worsening daily off of HD- I determined yest that I think is best to keep pt on chronic HD for now- have d/w SW- trying to get to facility and OP HD arranged- due later today - no c/o's  Objective Vital signs in last 24 hours: Vitals:   12/07/17 1426 12/07/17 2218 12/08/17 0500 12/08/17 0552  BP: (!) 161/78 (!) 160/82  (!) 158/74  Pulse: 84 93  88  Resp: 20 16  15   Temp: 98.6 F (37 C) 99.4 F (37.4 C)  99.4 F (37.4 C)  TempSrc:    Oral  SpO2: 97% 99%  96%  Weight:   46.9 kg   Height:       Weight change: 2.6 kg  Intake/Output Summary (Last 24 hours) at 12/08/2017 1330 Last data filed at 12/07/2017 1823 Gross per 24 hour  Intake 120 ml  Output 400 ml  Net -280 ml    Assessment/ Plan: Pt is a 62 y.o. yo female who was admitted on 11/29/2017 with recurrent A on CRF after pyelo- dialysis initiated 9/27 at [facility]- then was found to have CVA- transferred to [facility]  Assessment/Plan: 1. Renal- intermittently HD dep-  Done 10/12 then next on 10/19.  No UOP recorded but must be making some- she says only very little.   Has TDC placed 10/7.  Prior to most recent admission crt was around 4 and now I see that BUN and crt are worsening daily off of HD- I do not think is likely she will be able to do without HD- I think would be best to send out on HD but looking for signs of recovery.   Now looking into SNF ?? Do not know where it will be so dont know where to look for OP HD.  IF she is to go to the OP units in the [facility] system it is not our renal staff that sets it up, only if she goes to a [facility] unit.  I will plan on doing HD today here and will work with SW to set up an OP spot.  I have not pursued perm access yet, maybe give trial of about 4-6 weeks and if still HD dep will get  - run TTS for now  2. HTN/vol- BP if anything high- NO BP meds here or recorded as OP- have started coreg since pulse high as well - needs UF with  HD also for edema I think this will be the key  3. Anemia- hgb in 8's- is on ESA- last iron stores OK 4. CVA- on rehab/keppra  5. GU- recent pyelo- stents in place - I guess that GU here wants her to get stents removed/changed where she had them put in at [facility]- not sure how practical that might be  6. Bones- PTH was 237- will start vit D - phos now 6- add binder    Louis Meckel    Labs: Basic Metabolic Panel: Recent Labs  Lab 12/05/17 0613 12/06/17 0622 12/07/17 0501  NA 134* 132* 131*  K 3.4* 3.7 3.4*  CL 96* 98 96*  CO2 25 23 22   GLUCOSE 173* 112* 183*  BUN 23 38* 49*  CREATININE 3.27* 4.37* 5.38*  CALCIUM 8.3* 8.5* 8.6*  PHOS 3.7 5.6* 6.6*   Liver Function Tests: Recent Labs  Lab 12/05/17 0613 12/06/17 0622 12/07/17 0501  ALBUMIN 2.0* 2.2* 2.2*   No results for input(s): LIPASE, AMYLASE in the  last 168 hours. No results for input(s): AMMONIA in the last 168 hours. CBC: Recent Labs  Lab 12/03/17 0853 12/04/17 1417  WBC 7.3 9.1  HGB 8.7* 8.7*  HCT 28.3* 29.1*  MCV 94.0 96.0  PLT 346 359   Cardiac Enzymes: No results for input(s): CKTOTAL, CKMB, CKMBINDEX, TROPONINI in the last 168 hours. CBG: Recent Labs  Lab 12/07/17 1138 12/07/17 1626 12/07/17 2221 12/08/17 0648 12/08/17 1208  GLUCAP 97 103* 176* 98 197*    Iron Studies: No results for input(s): IRON, TIBC, TRANSFERRIN, FERRITIN in the last 72 hours. Studies/Results: No results found. Medications: Infusions:   Scheduled Medications: . bacitracin   Topical BID  . carvedilol  6.25 mg Oral BID WC  . Chlorhexidine Gluconate Cloth  6 each Topical Q0600  . Chlorhexidine Gluconate Cloth  6 each Topical Q0600  . darbepoetin (ARANESP) injection - DIALYSIS  150 mcg Subcutaneous Q Thu-1800  . feeding supplement (NEPRO CARB STEADY)  237 mL Oral BID WC  . heparin  5,000 Units Subcutaneous Q8H  . insulin aspart  0-5 Units Subcutaneous QHS  . insulin aspart  0-9 Units Subcutaneous TID WC  .  insulin glargine  10 Units Subcutaneous Daily  . levETIRAcetam  1,000 mg Oral Daily  . levETIRAcetam  500 mg Oral Q M,W,F-HD  . multivitamin  1 tablet Oral QHS  . nicotine  14 mg Transdermal Daily  . phenytoin  150 mg Oral BID  . saccharomyces boulardii  250 mg Oral BID  . topiramate  25 mg Oral BID    have reviewed scheduled and prn medications.  Physical Exam: General: NAD- not great historian Heart: RRR Lungs: mostly clear Abdomen: distended, slightly tender Extremities: pitting edema Dialysis Access: TDC on right     12/08/2017,1:30 PM  LOS: 9 days

## 2017-12-08 NOTE — Plan of Care (Signed)
  Problem: Consults Goal: RH STROKE PATIENT EDUCATION Description See Patient Education module for education specifics  Outcome: Progressing Goal: Nutrition Consult-if indicated Outcome: Progressing Goal: Diabetes Guidelines if Diabetic/Glucose > 140 Description If diabetic or lab glucose is > 140 mg/dl - Initiate Diabetes/Hyperglycemia Guidelines & Document Interventions  Outcome: Progressing   Problem: RH BOWEL ELIMINATION Goal: RH STG MANAGE BOWEL WITH ASSISTANCE Description STG Manage Bowel with mod I Assistance.  Outcome: Progressing   Problem: RH BLADDER ELIMINATION Goal: RH STG MANAGE BLADDER WITH ASSISTANCE Description STG Manage Bladder With mod I Assistance  Outcome: Progressing   Problem: RH SKIN INTEGRITY Goal: RH STG SKIN FREE OF INFECTION/BREAKDOWN Description Patients skin will remain free from further infection or breakdown with mod I assist.  Outcome: Progressing Goal: RH STG MAINTAIN SKIN INTEGRITY WITH ASSISTANCE Description STG Maintain Skin Integrity With mod I Assistance.  Outcome: Progressing   Problem: RH SAFETY Goal: RH STG ADHERE TO SAFETY PRECAUTIONS W/ASSISTANCE/DEVICE Description STG Adhere to Safety Precautions With mod I Assistance/Device.  Outcome: Progressing   Problem: RH KNOWLEDGE DEFICIT Goal: RH STG INCREASE KNOWLEDGE OF DIABETES Description Min assist  Outcome: Progressing Goal: RH STG INCREASE KNOWLEDGE OF HYPERTENSION Description Min assist  Outcome: Progressing Goal: RH STG INCREASE KNOWLEGDE OF HYPERLIPIDEMIA Description Min assist  Outcome: Progressing Goal: RH STG INCREASE KNOWLEDGE OF STROKE PROPHYLAXIS Description Min assist  Outcome: Progressing

## 2017-12-09 ENCOUNTER — Inpatient Hospital Stay (HOSPITAL_COMMUNITY): Payer: Medicaid Other

## 2017-12-09 ENCOUNTER — Inpatient Hospital Stay (HOSPITAL_COMMUNITY): Payer: Medicaid Other | Admitting: Occupational Therapy

## 2017-12-09 LAB — GLUCOSE, CAPILLARY
GLUCOSE-CAPILLARY: 174 mg/dL — AB (ref 70–99)
GLUCOSE-CAPILLARY: 269 mg/dL — AB (ref 70–99)
Glucose-Capillary: 135 mg/dL — ABNORMAL HIGH (ref 70–99)
Glucose-Capillary: 185 mg/dL — ABNORMAL HIGH (ref 70–99)

## 2017-12-09 LAB — CBC
HEMATOCRIT: 31.8 % — AB (ref 36.0–46.0)
HEMOGLOBIN: 9.7 g/dL — AB (ref 12.0–15.0)
MCH: 29.7 pg (ref 26.0–34.0)
MCHC: 30.5 g/dL (ref 30.0–36.0)
MCV: 97.2 fL (ref 80.0–100.0)
Platelets: 304 10*3/uL (ref 150–400)
RBC: 3.27 MIL/uL — AB (ref 3.87–5.11)
RDW: 22.7 % — ABNORMAL HIGH (ref 11.5–15.5)
WBC: 8.7 10*3/uL (ref 4.0–10.5)
nRBC: 0 % (ref 0.0–0.2)

## 2017-12-09 LAB — ALBUMIN: ALBUMIN: 2.3 g/dL — AB (ref 3.5–5.0)

## 2017-12-09 LAB — PHENYTOIN LEVEL, TOTAL: PHENYTOIN LVL: 4.3 ug/mL — AB (ref 10.0–20.0)

## 2017-12-09 MED ORDER — CHLORHEXIDINE GLUCONATE CLOTH 2 % EX PADS
6.0000 | MEDICATED_PAD | Freq: Every day | CUTANEOUS | Status: DC
  Administered 2017-12-10 – 2017-12-16 (×4): 6 via TOPICAL

## 2017-12-09 MED ORDER — CARVEDILOL 12.5 MG PO TABS
12.5000 mg | ORAL_TABLET | Freq: Two times a day (BID) | ORAL | Status: DC
  Administered 2017-12-09 – 2017-12-16 (×14): 12.5 mg via ORAL
  Filled 2017-12-09 (×13): qty 1

## 2017-12-09 NOTE — Progress Notes (Addendum)
Initial Nutrition Assessment  DOCUMENTATION CODES:   Severe malnutrition in context of chronic illness, Underweight  INTERVENTION:  -Continue Nepro shakes BID  -HS snack  NUTRITION DIAGNOSIS:   Severe Malnutrition related to chronic illness(CKD requiring HD) as evidenced by severe fat depletion, severe muscle depletion.  GOAL:   Patient will meet greater than or equal to 90% of their needs  MONITOR:   PO intake, Supplement acceptance, Weight trends, Labs  REASON FOR ASSESSMENT:   Other (Comment)(Low BMI)    ASSESSMENT:   Erica [name] is a 62 yo female with PMH of CKD, CHF, gastropathy, T2DM, neuropathy, HTN, seizure disorder, bileateral hydronephrosis s/p stent; previously admitted to [facility] 11/08/17 for weakness, acute on chronic renal failure w/ uremia, encephalopathy, luekocytosis. While on HD 9/29 developed sided weakness. MRI found multifocal infarct. 10/1 developed fever and HD cath removed. Right catheter placed 10/7, HD ongoing TTS.   Pt currently on renal/carb modified diet w/ 1200 ml fluid restriction. Currently consuming between 60-100% of meals.  Pt on Nepro BID.  Per chart pt with progressive wt loss since 10/14. HD 10/23 UF 3000 ml. Post HD wt 43.9 kg Per nephrology, temp cath trial HD for 4-6 weeks, not yet pursuing permanent access.   Visited patient in room today. She reports eating well and enjoying Nepro shakes.  Pt wants bedtime snack; unsure if she is getting it per renal diet order.  Labs: CBGs 98-200, Phosphorus 8.1, Sodium 134 Meds: Nepro, Novolog ss, Lantus, Renal MVI, Phoslo, Florastor   NUTRITION - FOCUSED PHYSICAL EXAM:    Most Recent Value  Orbital Region  Moderate depletion  Upper Arm Region  Severe depletion  Thoracic and Lumbar Region  Severe depletion  Buccal Region  Severe depletion  Temple Region  Severe depletion  Clavicle Bone Region  Severe depletion  Clavicle and Acromion Bone Region  Severe depletion  Scapular Bone Region   Severe depletion  Dorsal Hand  Moderate depletion  Patellar Region  Severe depletion  Anterior Thigh Region  Severe depletion  Posterior Calf Region  Severe depletion  Edema (RD Assessment)  Mild  Hair  Reviewed  Eyes  Reviewed  Mouth  Reviewed  Skin  Reviewed  Nails  Reviewed       Diet Order:   Diet Order            Diet renal/carb modified with fluid restriction Diet-HS Snack? Nothing; Fluid restriction: 1200 mL Fluid; Room service appropriate? Yes; Fluid consistency: Thin  Diet effective now              EDUCATION NEEDS:   Education needs have been addressed  Skin:  Skin Assessment: Reviewed RN Assessment  Last BM:  10/24 type 6  Height:   Ht Readings from Last 1 Encounters:  11/29/17 5' (1.524 m)    Weight:   Wt Readings from Last 1 Encounters:  12/09/17 40.6 kg    Ideal Body Weight:  45 kg  BMI:  Body mass index is 17.48 kg/m.  Estimated Nutritional Needs:   Kcal:  1600-1800  Protein:  85-95  Fluid:  1.2 L/day per MD   Loree Fee Laine Giovanetti, Dietetic Intern [phone]

## 2017-12-09 NOTE — Plan of Care (Signed)
  Problem: Spiritual Needs Goal: Ability to function at adequate level Outcome: Progressing   Problem: Consults Goal: RH STROKE PATIENT EDUCATION Description See Patient Education module for education specifics  Outcome: Progressing Goal: Nutrition Consult-if indicated Outcome: Progressing Goal: Diabetes Guidelines if Diabetic/Glucose > 140 Description If diabetic or lab glucose is > 140 mg/dl - Initiate Diabetes/Hyperglycemia Guidelines & Document Interventions  Outcome: Progressing   Problem: RH BOWEL ELIMINATION Goal: RH STG MANAGE BOWEL WITH ASSISTANCE Description STG Manage Bowel with mod I Assistance.  Outcome: Progressing   Problem: RH BLADDER ELIMINATION Goal: RH STG MANAGE BLADDER WITH ASSISTANCE Description STG Manage Bladder With mod I Assistance  Outcome: Progressing   Problem: RH SKIN INTEGRITY Goal: RH STG SKIN FREE OF INFECTION/BREAKDOWN Description Patients skin will remain free from further infection or breakdown with mod I assist.  Outcome: Progressing Goal: RH STG MAINTAIN SKIN INTEGRITY WITH ASSISTANCE Description STG Maintain Skin Integrity With mod I Assistance.  Outcome: Progressing   Problem: RH SAFETY Goal: RH STG ADHERE TO SAFETY PRECAUTIONS W/ASSISTANCE/DEVICE Description STG Adhere to Safety Precautions With mod I Assistance/Device.  Outcome: Progressing   Problem: RH KNOWLEDGE DEFICIT Goal: RH STG INCREASE KNOWLEDGE OF DIABETES Description Min assist  Outcome: Progressing Goal: RH STG INCREASE KNOWLEDGE OF HYPERTENSION Description Min assist  Outcome: Progressing Goal: RH STG INCREASE KNOWLEGDE OF HYPERLIPIDEMIA Description Min assist  Outcome: Progressing Goal: RH STG INCREASE KNOWLEDGE OF STROKE PROPHYLAXIS Description Min assist  Outcome: Progressing

## 2017-12-09 NOTE — Progress Notes (Signed)
Physical Therapy Session Note  Patient Details  Name: Erica [name] MRN: [id number] Date of Birth: [birth year]  Today's Date: 12/09/2017 PT Individual Time: [phone] PT Individual Time Calculation (min): 23 min   Short Term Goals: Week 2:  PT Short Term Goal 1 (Week 2): STG = LTG due to anticipated d/c.      Skilled Therapeutic Interventions/Progress Updates:   Pt seated EOB.  Pt having L LE spastic movements in hip flexion/extension; it eased with PT blocking it and neuro re-ed below.  neuromuscular re-education via multimodal cues for 10 x 1 each : seated- trunk flexion/extension, L/R long arc quad knee extension with ankle pumps at end range; standing- bil heel raises with bil UE support.  Given external perturbations, pt demonstrates delayed bil ankle strategies and absent bil hip or stepping strategies.   Gait training with RW x 150' on level tile, min guard assist, cues to turn head and keep RW with her as she turned body.  Pt able to increase velocity with 1 VC, with improved trunk stability detected with greater speed.   Pt left resting in w/c with seat belt alarm set and all needs within reach.     Therapy Documentation Precautions:  Precautions Precautions: Fall Precaution Comments: seizures Restrictions Weight Bearing Restrictions: No General: PT Amount of Missed Time (min): 7 Minutes PT Missed Treatment Reason: Nursing care(HD fistula care) Pain: Pain Assessment Pain Scale: 0-10 Pain Score: 0-No pain    Therapy/Group: Individual Therapy  Iara Monds 12/09/2017, 9:23 AM

## 2017-12-09 NOTE — Progress Notes (Signed)
[facility] PHYSICAL MEDICINE & REHABILITATION PROGRESS NOTE   Subjective/Complaints:  Reviewed xray results, degenerative facet with mild instability L4-5 may predispose top development of neurogenic claudication Given recent CVA no elective surgery is contemplated so would not pursue MRI  Discussed need for HD, pt states she was not aware  ROS: Patient denies  nausea, vomiting, diarrhea, cough, shortness of breath or chest pain, joint or back pain,   Objective:   Dg Lumbar Spine 2-3 Views  Result Date: 12/08/2017 CLINICAL DATA:  Neurogenic claudication EXAM: LUMBAR SPINE - 2-3 VIEW COMPARISON:  CT 11/08/2017 FINDINGS: Bilateral ureteral stents are noted in place. Calcifications throughout the pancreas. Moderate to severe compression deformities noted at T10, T12, and the superior endplate of L2. These findings are stable since prior CT. Slight anterolisthesis of L4 on L5 related to facet disease, stable. Disc spaces maintained. SI joints symmetric and unremarkable. IMPRESSION: Stable chronic compression fractures at T10, T12 and superior endplate of L2. Degenerative facet disease with grade 1 anterolisthesis at L4-5. Chronic pancreatitis changes. Electronically Signed   By: Rolm Baptise M.D.   On: 12/08/2017 21:43   Recent Labs    12/08/17 1353 12/09/17 0653  WBC 8.2 8.7  HGB 9.3* 9.7*  HCT 31.2* 31.8*  PLT 350 304   Recent Labs    12/07/17 0501 12/08/17 1352  NA 131* 134*  K 3.4* 3.5  CL 96* 96*  CO2 22 21*  GLUCOSE 183* 194*  BUN 49* 67*  CREATININE 5.38* 6.35*  CALCIUM 8.6* 8.9    Intake/Output Summary (Last 24 hours) at 12/09/2017 0816 Last data filed at 12/08/2017 2156 Gross per 24 hour  Intake 360 ml  Output 3000 ml  Net -2640 ml     Physical Exam: Vital Signs Blood pressure (!) 153/83, pulse 87, temperature 98.7 F (37.1 C), resp. rate 17, height 5' (1.524 m), weight 40.6 kg, SpO2 96 %. Constitutional: No distress . Vital signs reviewed. HEENT: EOMI,  oral membranes moist Neck: supple Cardiovascular: RRR without murmur. No JVD    Respiratory: CTA Bilaterally without wheezes or rales. Normal effort    GI: BS +, non-tender, non-distended  Musculoskeletal:  No edema or tenderness in extremities  Neurological: She is alert.  Speech fairly clear. Follows basic commands.  Motor: motor 4/5 throughout LUE ataxia Skin: Skin is warm and dry.  Psychiatric: cooperative and pleasant     Assessment/Plan: 1. 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  Physiatrist is providing close team supervision and 24 hour management of active medical problems listed below.  Physiatrist and rehab team continue to assess barriers to discharge/monitor patient progress toward functional and medical goals  Care Tool:  Bathing  Bathing activity did not occur: Refused Body parts bathed by patient: Right arm, Left arm, Chest, Abdomen, Right upper leg, Face, Left upper leg, Front perineal area, Buttocks, Right lower leg, Left lower leg         Bathing assist Assist Level: Supervision/Verbal cueing     Upper Body Dressing/Undressing Upper body dressing Upper body dressing/undressing activity did not occur (including orthotics): Refused What is the patient wearing?: Pull over shirt    Upper body assist Assist Level: Independent    Lower Body Dressing/Undressing Lower body dressing    Lower body dressing activity did not occur: Refused What is the patient wearing?: Pants, Incontinence brief     Lower body assist  Assist for lower body dressing: Supervision/Verbal cueing     Toileting Toileting    Toileting assist Assist for toileting: Supervision/Verbal cueing     Transfers Chair/bed transfer  Transfers assist     Chair/bed transfer assist level: Supervision/Verbal cueing(RW)      Locomotion Ambulation   Ambulation assist      Assist level: Supervision/Verbal cueing Assistive device: Walker-rolling Max distance: 150 ft    Walk 10 feet activity   Assist     Assist level: Supervision/Verbal cueing Assistive device: Walker-rolling   Walk 50 feet activity   Assist    Assist level: Supervision/Verbal cueing Assistive device: Walker-rolling    Walk 150 feet activity   Assist Walk 150 feet activity did not occur: Safety/medical concerns  Assist level: Supervision/Verbal cueing Assistive device: Walker-rolling    Walk 10 feet on uneven surface  activity   Assist     Assist level: Supervision/Verbal cueing Assistive device: Aeronautical engineer Will patient use wheelchair at discharge?: No   Wheelchair activity did not occur: Safety/medical concerns         Wheelchair 50 feet with 2 turns activity    Assist    Wheelchair 50 feet with 2 turns activity did not occur: Safety/medical concerns       Wheelchair 150 feet activity     Assist Wheelchair 150 feet activity did not occur: Safety/medical concerns        Medical Problem List and Plan: 1.  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    ---Continue CIR therapies including PT, OT, and SLP  Stable for ALF 2.  DVT Prophylaxis/Anticoagulation: Pharmaceutical: Lovenox 3. Pain Management: tylenol prn. Leg /foot tingling with prolonged walking probably represents neurogenic claudication 4. Mood: LCSW to follow for evaluation and support.  5. Neuropsych: This patient is not fully capable of making decisions on her own behalf. 6. Skin/Wound Care: Routine pressure relief measures measures.  - added prn benadryl at night to help with itching 7.  Fluids/Electrolytes/Nutrition: Strict I's and O's.   - Labs with dialysis. 8.  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   Pharmacy dosing on [facility]  -Neurology recommended to increase Topamax to 50 mg twice daily (on 25mg  currently)---no change at present 9.  History of hydronephrosis s/p stents UTI treated 10.  T2DM: Intake improving with improvement in mentation.  Low-dose Lantus resumed  .  -Increased Lantus insulin to 10 units daily ln 10/16, increase to 14units  -SSI coverage   CBG (last 3)  Recent Labs    12/08/17 1817 12/08/17 2139 12/09/17 0634  GLUCAP 98 200* 135*  controlled this am 10/24- avoid hypoglycemia, keep CBG >100  11.  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  Anemia of chronic disease:   Continue Aranesp.   14.  Daily soft BM with incont, not on laxatives  LOS: 10 days A FACE TO FACE EVALUATION WAS PERFORMED  Erica [name] 12/09/2017, 8:16 AM

## 2017-12-09 NOTE — Progress Notes (Signed)
Occupational Therapy Session Note  Patient Details  Name: Erica [name] MRN: [id number] Date of Birth: 01/12/1956  Today's Date: 12/09/2017 OT Individual Time: 1140-1200 OT Individual Time Calculation (min): 20 min    Short Term Goals: Week 1:  OT Short Term Goal 1 (Week 1): Pt will complete LB dressing with supervision OT Short Term Goal 1 - Progress (Week 1): Met OT Short Term Goal 2 (Week 1): Pt will complete bathing with supervision at sit > stand level OT Short Term Goal 2 - Progress (Week 1): Met OT Short Term Goal 3 (Week 1): Pt will complete toilet transfers ambulating with LRAD OT Short Term Goal 3 - Progress (Week 1): Met OT Short Term Goal 4 (Week 1): Pt will complete 2 grooming tasks in standing to increase activity tolerance  OT Short Term Goal 4 - Progress (Week 1): Met Week 2:  OT Short Term Goal 1 (Week 2): STG = LTGs due to remaining LOS     Skilled Therapeutic Interventions/Progress Updates:    Pt received in bed and declined a  Bath and stated she preferred to stay in hospital gown. Pt agreeable to working on standing balance and endurance exercises. Pt sat to EOB with S and stood with S to RW.   In standing, pt had light support on RW while marching in place, forward toe taps, side toe taps, and alternating knee lifts. Pt rested in sitting (cues to reach back for bed), for 2 min and then repeated the exercises for a 2nd set.  Pt transferred to recliner to prepare for lunch.  Chair belt on and tray table in place.  Call light in reach.  All pt needs met.    Therapy Documentation Precautions:  Precautions Precautions: Fall Precaution Comments: seizures Restrictions Weight Bearing Restrictions: No  Pain: Pain Assessment Pain Scale: 0-10 Pain Score: 0-No pain ADL: ADL Eating: Set up Grooming: Setup, Minimal assistance Where Assessed-Grooming: Standing at sink Upper Body Bathing: Minimal cueing, Minimal assistance Where Assessed-Upper Body Bathing:  Sitting at sink Lower Body Bathing: Minimal cueing, Minimal assistance Where Assessed-Lower Body Bathing: (sit > stand at sink) Upper Body Dressing: Setup Where Assessed-Upper Body Dressing: Sitting at sink Lower Body Dressing: Minimal assistance Where Assessed-Lower Body Dressing: (sit > stand at sink) Toilet Transfer: Minimal assistance Toilet Transfer Method: Stand pivot Toilet Transfer Equipment: Bedside commode   Therapy/Group: Individual Therapy  SAGUIER,JULIA 12/09/2017, 9:18 AM

## 2017-12-09 NOTE — Progress Notes (Signed)
Social Work Patient ID: [name], female   DOB: [birth year], 63 y.o.   MRN: [id number] Spoke with [facility]-and they have accepted pt inh their [facility]. Has the T, TH and Sat slot at 11:45. Also spoke with [facility] and they feel she is more car ethan they can handle. Son wants her to go to a place where she will get more rehab and wants [facility] or [facility] looked at. Will send FL2 to both of them and try to find a bed for pt.

## 2017-12-09 NOTE — Progress Notes (Signed)
[place] for phenytoin dosing Indication: Patient safety/ therapeutic monitoring  Allergies  Allergen Reactions  . Aspirin Palpitations   Patient Measurements: Height: 5' (152.4 cm) Weight: 89 lb 8.1 oz (40.6 kg) IBW/kg (Calculated) : 45.5  Vital Signs: Temp: 98.7 F (37.1 C) (10/24 0320) BP: 153/83 (10/24 0320) Pulse Rate: 87 (10/24 0320) Intake/Output from previous day: 10/23 0701 - 10/24 0700 In: 480 [P.O.:480] Out: 3000  Intake/Output from this shift: Total I/O In: 325 [P.O.:325] Out: -   Labs: Recent Labs    12/07/17 0501 12/08/17 1352 12/08/17 1353 12/09/17 0653  WBC  --   --  8.2 8.7  HGB  --   --  9.3* 9.7*  HCT  --   --  31.2* 31.8*  PLT  --   --  350 304  CREATININE 5.38* 6.35*  --   --   PHOS 6.6* 8.1*  --   --   ALBUMIN 2.2* 2.3*  --  2.3*   Estimated Creatinine Clearance: 5.8 mL/min (A) (by C-G formula based on SCr of 6.35 mg/dL (H)).  Assessment: 62 yo F recently initiated on dialysis started on phenytoin inpatient for breakthrough seizures. Neurology initially followed pt & wanted pharmacy to take over dosing for phenytoin. Pt's dose was reduced from 100mg  q8h to 125mg  q12h on 10/2.   Last total phenytoin level was 4.3  which corrects to ~13 with low albumin with new chronic HD. No further seizures noted. Dose increased slightly on 10/18.  Monitoring Corrected phenytoin level = 10-24mcg/mL  Plan:  Continue phenytoin 150mg  PO BID Monitor clinical status, seizures, phenytoin levels prn  Elenor Quinones, PharmD, BCPS Clinical Pharmacist Phone number [phone] 12/09/2017 8:38 AM

## 2017-12-09 NOTE — Progress Notes (Signed)
Occupational Therapy Weekly Progress Note  Patient Details  Name: Erica [name] MRN: [id number] Date of Birth: Jan 19, 1956  Beginning of progress report period: November 30, 2017 End of progress report period: December 09, 2017   Patient has met 4 of 4 short term goals.  Pt is making steady progress towards goals.  Pt currently supervision with self-care tasks at sit > stand level at sink due to HD catheter.  Pt ambulating short distances with RW with supervision.  Pt c/o increasing "tingling" in feet, therefore have decreased distance with ambulation but pt still at overall supervision for dynamic standing balance.  Pt will require 24/7 supervision if to return home due to decreased cognition with higher level IADLs.  Patient continues to demonstrate the following deficits: muscle weakness, decreased cardiorespiratoy endurance, impaired timing and sequencing, unbalanced muscle activation and decreased coordination, decreased attention, decreased awareness, decreased problem solving, decreased safety awareness and decreased memory and decreased standing balance, decreased postural control, hemiplegia and decreased balance strategies and therefore will continue to benefit from skilled OT intervention to enhance overall performance with BADL, iADL and Reduce care partner burden.  Patient progressing toward long term goals..  Continue plan of care.  OT Short Term Goals Week 1:  OT Short Term Goal 1 (Week 1): Pt will complete LB dressing with supervision OT Short Term Goal 1 - Progress (Week 1): Met OT Short Term Goal 2 (Week 1): Pt will complete bathing with supervision at sit > stand level OT Short Term Goal 2 - Progress (Week 1): Met OT Short Term Goal 3 (Week 1): Pt will complete toilet transfers ambulating with LRAD OT Short Term Goal 3 - Progress (Week 1): Met OT Short Term Goal 4 (Week 1): Pt will complete 2 grooming tasks in standing to increase activity tolerance  OT Short Term Goal 4 -  Progress (Week 1): Met Week 2:  OT Short Term Goal 1 (Week 2): STG = LTGs due to remaining LOS    [name], [facility] 12/09/2017, 7:52 AM

## 2017-12-09 NOTE — NC FL2 (Signed)
[place] MEDICAID FL2 LEVEL OF CARE SCREENING TOOL     IDENTIFICATION  Patient Name: Erica [name] Birthdate: [birth year] Sex: female Admission Date (Current Location): 11/29/2017  [facility] and [place] Number:  [employer] and Address:  [facility], [place] [address] [place], [place] [address]      Provider Number: [id number]  Attending Physician Name and Address:  Charlett Blake, MD  Relative Name and Phone Number:  Lorenda Hatchet [phone]    Current Level of Care: SNF Recommended Level of Care: [place] Prior Approval Number:    Date Approved/Denied:   PASRR Number: [id number]  Discharge Plan: SNF    Current Diagnoses: Patient Active Problem List   Diagnosis Date Noted  . History of hydronephrosis --stents in place 12/08/2017  . Stroke ([place]) 11/29/2017  . Seizures ([place])   . Hydronephrosis   . Recurrent UTI   . Diabetes mellitus type 2 in nonobese (HCC)   . ESRD (end stage renal disease) ([place])   . Anemia of chronic disease   . Chronic diastolic congestive heart failure ([place])   . Essential hypertension   . AKI (acute kidney injury) ([place])   . PICC (peripherally inserted central catheter) in place   . Acute pyelonephritis 11/12/2017  . Acute renal failure superimposed on chronic kidney disease ([place]) 11/10/2017  . Uncontrolled type 2 diabetes mellitus with hyperglycemia, with long-term current use of insulin ([place]) 11/10/2017  . Renal failure 11/08/2017  . Seizure disorder ([place]) 11/08/2017  . Orthostatic hypotension 07/25/2014  . Moderate protein malnutrition ([place]) 09/15/2013  . Aspiration pneumonia ([place]) 09/15/2013  . Acute respiratory failure requiring reintubation ([place]) 09/11/2013  . probable Seizures due to metabolic disorder 02/58/5277  . Lactic acidosis 03/19/2013  . Abdominal pain 03/19/2013  . Rotavirus infection 10/29/2012  . Type II or unspecified type diabetes mellitus without mention  of complication, uncontrolled 10/29/2012  . Protein-calorie malnutrition, severe ([place]) 10/27/2012  . NSTEMI (non-ST elevated myocardial infarction) ([employer]) 10/26/2012  . Fever, unspecified 10/26/2012  . Hypotension 10/25/2012  . Alcohol withdrawal ([place]) 10/25/2012  . Metabolic acidosis 82/42/3536  . Chronic diarrhea 10/25/2012  . Tobacco abuse 10/25/2012  . DKA (diabetic ketoacidoses) ([place]) 09/09/2012  . Dehydration 09/09/2012  . DKA, type 2 ([place]) 05/20/2012  . Abnormal LFTs 05/20/2012  . Heart murmur, systolic 14/43/1540  . Diabetes mellitus ([place]) 07/08/2011  . Hypoglycemia 07/08/2011  . Metabolic encephalopathy 08/67/6195  . Alcohol intoxication ([place]) 07/08/2011  . Alcohol abuse 07/08/2011  . Hypokalemia 07/08/2011  . Nausea & vomiting 07/08/2011  . Abdominal pain 07/08/2011  . H/O chronic pancreatitis 07/08/2011    Orientation RESPIRATION BLADDER Height & Weight     Self, Situation, Place  Normal Incontinent Weight: 89 lb 8.1 oz (40.6 kg) Height:  5' (152.4 cm)  BEHAVIORAL SYMPTOMS/MOOD NEUROLOGICAL BOWEL NUTRITION STATUS    Convulsions/Seizures(upon admission 9/23-not since then) Continent Diet(renal diet)  AMBULATORY STATUS COMMUNICATION OF NEEDS Skin   Supervision Verbally Normal                       Personal Care Assistance Level of Assistance  Bathing, Dressing Bathing Assistance: Limited assistance Feeding assistance: Independent Dressing Assistance: Limited assistance     Functional Limitations Info  Sight, Hearing Sight Info: Adequate Hearing Info: Adequate Speech Info: Adequate    SPECIAL CARE FACTORS FREQUENCY  PT (By licensed PT), OT (By licensed OT), Bowel and bladder program     PT Frequency: 5x week  OT Frequency: 5x week Bowel and Bladder Program Frequency: Timed toileting for bladder re-training   Speech Therapy Frequency: 3x week      Contractures Contractures Info: Not present    Additional Factors Info  Code Status, Allergies  Code Status Info: Full Allergies Info: Aspirin           Current Medications (12/09/2017):  This is the current hospital active medication list Current Facility-Administered Medications  Medication Dose Route Frequency Provider Last Rate Last Dose  . acetaminophen (TYLENOL) tablet 325-650 mg  325-650 mg Oral Q4H PRN Bary Leriche, PA-C   650 mg at 12/07/17 0056  . bacitracin ointment   Topical BID Love, Pamela S, PA-C      . bisacodyl (DULCOLAX) suppository 10 mg  10 mg Rectal Daily PRN Love, Pamela S, PA-C      . calcitRIOL (ROCALTROL) capsule 0.25 mcg  0.25 mcg Oral Q T,Th,Sa-HD Corliss Parish, MD      . calcium acetate (PHOSLO) capsule 667 mg  667 mg Oral TID WC Corliss Parish, MD   667 mg at 12/09/17 1206  . carvedilol (COREG) tablet 12.5 mg  12.5 mg Oral BID WC Corliss Parish, MD      . Chlorhexidine Gluconate Cloth 2 % PADS 6 each  6 each Topical Q0600 Bary Leriche, PA-C   6 each at 12/09/17 0640  . Chlorhexidine Gluconate Cloth 2 % PADS 6 each  6 each Topical Q0600 Corliss Parish, MD   6 each at 12/08/17 0544  . [START ON 12/10/2017] Chlorhexidine Gluconate Cloth 2 % PADS 6 each  6 each Topical Q0600 Corliss Parish, MD      . Darbepoetin Alfa (ARANESP) injection 150 mcg  150 mcg Subcutaneous Q Thu-1800 Mauricia Area, MD   150 mcg at 12/02/17 1805  . diphenhydrAMINE (BENADRYL) 12.5 MG/5ML elixir 12.5-25 mg  12.5-25 mg Oral Q6H PRN Bary Leriche, PA-C   25 mg at 12/09/17 1700  . diphenoxylate-atropine (LOMOTIL) 2.5-0.025 MG per tablet 1 tablet  1 tablet Oral BID PRN Hammons, Theone Murdoch, RPH   1 tablet at 12/09/17 0809  . feeding supplement (NEPRO CARB STEADY) liquid 237 mL  237 mL Oral BID WC Bary Leriche, PA-C   237 mL at 12/09/17 0816  . guaiFENesin-dextromethorphan (ROBITUSSIN DM) 100-10 MG/5ML syrup 5-10 mL  5-10 mL Oral Q6H PRN Love, Pamela S, PA-C      . heparin injection 5,000 Units  5,000 Units Subcutaneous Q8H Bary Leriche, PA-C   5,000  Units at 12/09/17 1749  . heparin injection 900 Units  20 Units/kg Dialysis PRN Corliss Parish, MD      . insulin aspart (novoLOG) injection 0-5 Units  0-5 Units Subcutaneous QHS Love, Pamela S, PA-C      . insulin aspart (novoLOG) injection 0-9 Units  0-9 Units Subcutaneous TID WC Bary Leriche, PA-C   2 Units at 12/09/17 1206  . insulin glargine (LANTUS) injection 10 Units  10 Units Subcutaneous Daily Lisabeth Pick, MD   10 Units at 12/09/17 [phone]  . levETIRAcetam (KEPPRA) tablet 1,000 mg  1,000 mg Oral Daily Bary Leriche, PA-C   1,000 mg at 12/09/17 7591  . levETIRAcetam (KEPPRA) tablet 500 mg  500 mg Oral Q M,W,F-HD Kirsteins, Luanna Salk, MD   500 mg at 12/08/17 1819  . multivitamin (RENA-VIT) tablet 1 tablet  1 tablet Oral QHS Bary Leriche, PA-C   1 tablet at 12/08/17 2156  . nicotine (NICODERM CQ -  dosed in mg/24 hours) patch 14 mg  14 mg Transdermal Daily Bary Leriche, PA-C   14 mg at 12/09/17 0818  . ondansetron (ZOFRAN) tablet 4 mg  4 mg Oral Q6H PRN Love, Pamela S, PA-C       Or  . ondansetron (ZOFRAN) injection 4 mg  4 mg Intravenous Q6H PRN Love, Pamela S, PA-C      . phenytoin (DILANTIN) 125 MG/5ML suspension 150 mg  150 mg Oral BID Rumbarger, Rachel L, RPH   150 mg at 12/09/17 0810  . polyethylene glycol (MIRALAX / GLYCOLAX) packet 17 g  17 g Oral Daily PRN Love, Pamela S, PA-C      . saccharomyces boulardii (FLORASTOR) capsule 250 mg  250 mg Oral BID Reesa Chew S, PA-C   250 mg at 12/09/17 0810  . senna-docusate (Senokot-S) tablet 1 tablet  1 tablet Oral QHS PRN Love, Pamela S, PA-C      . topiramate (TOPAMAX) tablet 25 mg  25 mg Oral BID Reesa Chew S, PA-C   25 mg at 12/09/17 0809  . traZODone (DESYREL) tablet 25-50 mg  25-50 mg Oral QHS PRN Bary Leriche, PA-C   50 mg at 12/07/17 2108     Discharge Medications: Please see discharge summary for a list of discharge medications.  Relevant Imaging Results:  Relevant Lab Results:   Additional  Information SSN: [id number] Pt is a new dialysis pt and has been accepted at the  [place], [place] sat at the 11:45 slot.  Archer Moist, Gardiner Rhyme, LCSW

## 2017-12-09 NOTE — Progress Notes (Signed)
Subjective:  HD yest , removed 3000 tolerated well   Objective Vital signs in last 24 hours: Vitals:   12/08/17 1730 12/08/17 1800 12/09/17 0320 12/09/17 0652  BP: (!) 168/93 (!) 170/80 (!) 153/83   Pulse: 82 87 87   Resp: 16 16 17    Temp:  (!) 97.1 F (36.2 C) 98.7 F (37.1 C)   TempSrc:  Oral    SpO2:  96% 96%   Weight:  43.9 kg  40.6 kg  Height:       Weight change: 0.5 kg  Intake/Output Summary (Last 24 hours) at 12/09/2017 1223 Last data filed at 12/09/2017 0825 Gross per 24 hour  Intake 685 ml  Output 3000 ml  Net -2315 ml    Assessment/ Plan: Pt is a 62 y.o. yo female who was admitted on 11/29/2017 with recurrent A on CRF after pyelo- dialysis initiated 9/27 at [facility]- then was found to have CVA- transferred to [facility]  Assessment/Plan: 1. Renal- intermittently HD dep-  Done 10/12 then 10/19.  [place] placed 10/7.  Prior to most recent admission crt was around 4 and now I see that BUN and crt are worsening daily off of HD- I do not think is likely she will be able to do without HD- I think would be best to send out on HD but looking for signs of recovery.   Now looking into SNF ?? Do not know where it will be so dont know where to look for OP HD.  If she is to go to the OP units in the [facility] system it is not our renal staff that sets it up, only if she goes to a [facility] unit.  I will plan on doing HD tomorrow here and will work with SW to set up an OP spot.  I have not pursued perm access yet, maybe give trial of about 4-6 weeks and if still HD dep will get  - run MWF for now  2. HTN/vol- BP if anything high- NO BP meds here or recorded as OP- have started coreg since pulse high as well, will inc - needs UF with HD also for edema I think this will be the key  3. Anemia- hgb in 8's, improving - is on ESA- last iron stores OK 4. CVA- on rehab/keppra  5. GU- recent pyelo- stents in place - I guess that GU here wants her to get stents removed/changed where she had them  put in at [facility]- not sure how practical that might be  6. Bones- PTH was 237- will start vit D - phos now 6- added binder    Lisa-Marie Rueger A Griff Badley    Labs: Basic Metabolic Panel: Recent Labs  Lab 12/06/17 0622 12/07/17 0501 12/08/17 1352  NA 132* 131* 134*  K 3.7 3.4* 3.5  CL 98 96* 96*  CO2 23 22 21*  GLUCOSE 112* 183* 194*  BUN 38* 49* 67*  CREATININE 4.37* 5.38* 6.35*  CALCIUM 8.5* 8.6* 8.9  PHOS 5.6* 6.6* 8.1*   Liver Function Tests: Recent Labs  Lab 12/07/17 0501 12/08/17 1352 12/09/17 0653  ALBUMIN 2.2* 2.3* 2.3*   No results for input(s): LIPASE, AMYLASE in the last 168 hours. No results for input(s): AMMONIA in the last 168 hours. CBC: Recent Labs  Lab 12/03/17 0853 12/04/17 1417 12/08/17 1353 12/09/17 0653  WBC 7.3 9.1 8.2 8.7  HGB 8.7* 8.7* 9.3* 9.7*  HCT 28.3* 29.1* 31.2* 31.8*  MCV 94.0 96.0 97.2 97.2  PLT  346 359 350 304   Cardiac Enzymes: No results for input(s): CKTOTAL, CKMB, CKMBINDEX, TROPONINI in the last 168 hours. CBG: Recent Labs  Lab 12/08/17 0648 12/08/17 1208 12/08/17 1817 12/08/17 2139 12/09/17 0634  GLUCAP 98 197* 98 200* 135*    Iron Studies: No results for input(s): IRON, TIBC, TRANSFERRIN, FERRITIN in the last 72 hours. Studies/Results: Dg Lumbar Spine 2-3 Views  Result Date: 12/08/2017 CLINICAL DATA:  Neurogenic claudication EXAM: LUMBAR SPINE - 2-3 VIEW COMPARISON:  CT 11/08/2017 FINDINGS: Bilateral ureteral stents are noted in place. Calcifications throughout the pancreas. Moderate to severe compression deformities noted at T10, T12, and the superior endplate of L2. These findings are stable since prior CT. Slight anterolisthesis of L4 on L5 related to facet disease, stable. Disc spaces maintained. SI joints symmetric and unremarkable. IMPRESSION: Stable chronic compression fractures at T10, T12 and superior endplate of L2. Degenerative facet disease with grade 1 anterolisthesis at L4-5. Chronic pancreatitis changes.  Electronically Signed   By: Rolm Baptise M.D.   On: 12/08/2017 21:43   Medications: Infusions:   Scheduled Medications: . bacitracin   Topical BID  . calcitRIOL  0.25 mcg Oral Q T,Th,Sa-HD  . calcium acetate  667 mg Oral TID WC  . carvedilol  6.25 mg Oral BID WC  . Chlorhexidine Gluconate Cloth  6 each Topical Q0600  . Chlorhexidine Gluconate Cloth  6 each Topical Q0600  . darbepoetin (ARANESP) injection - DIALYSIS  150 mcg Subcutaneous Q Thu-1800  . feeding supplement (NEPRO CARB STEADY)  237 mL Oral BID WC  . heparin  5,000 Units Subcutaneous Q8H  . insulin aspart  0-5 Units Subcutaneous QHS  . insulin aspart  0-9 Units Subcutaneous TID WC  . insulin glargine  10 Units Subcutaneous Daily  . levETIRAcetam  1,000 mg Oral Daily  . levETIRAcetam  500 mg Oral Q M,W,F-HD  . multivitamin  1 tablet Oral QHS  . nicotine  14 mg Transdermal Daily  . phenytoin  150 mg Oral BID  . saccharomyces boulardii  250 mg Oral BID  . topiramate  25 mg Oral BID    have reviewed scheduled and prn medications.  Physical Exam: General: NAD- not great historian Heart: RRR Lungs: mostly clear Abdomen: distended, slightly tender Extremities: pitting edema Dialysis Access: TDC on right     12/09/2017,12:23 PM  LOS: 10 days

## 2017-12-10 ENCOUNTER — Inpatient Hospital Stay (HOSPITAL_COMMUNITY): Payer: Medicaid Other | Admitting: Physical Therapy

## 2017-12-10 ENCOUNTER — Inpatient Hospital Stay (HOSPITAL_COMMUNITY): Payer: Medicaid Other

## 2017-12-10 DIAGNOSIS — D638 Anemia in other chronic diseases classified elsewhere: Secondary | ICD-10-CM

## 2017-12-10 LAB — URINALYSIS, ROUTINE W REFLEX MICROSCOPIC
Bilirubin Urine: NEGATIVE
Glucose, UA: NEGATIVE mg/dL
Ketones, ur: NEGATIVE mg/dL
Nitrite: NEGATIVE
PROTEIN: 100 mg/dL — AB
Specific Gravity, Urine: 1.01 (ref 1.005–1.030)
pH: 7 (ref 5.0–8.0)

## 2017-12-10 LAB — RENAL FUNCTION PANEL
ALBUMIN: 2.7 g/dL — AB (ref 3.5–5.0)
ANION GAP: 15 (ref 5–15)
BUN: 44 mg/dL — ABNORMAL HIGH (ref 8–23)
CALCIUM: 9.2 mg/dL (ref 8.9–10.3)
CO2: 20 mmol/L — ABNORMAL LOW (ref 22–32)
CREATININE: 4.82 mg/dL — AB (ref 0.44–1.00)
Chloride: 97 mmol/L — ABNORMAL LOW (ref 98–111)
GFR calc non Af Amer: 9 mL/min — ABNORMAL LOW (ref >60)
GFR, EST AFRICAN AMERICAN: 10 mL/min — AB (ref >60)
GLUCOSE: 242 mg/dL — AB (ref 70–99)
PHOSPHORUS: 7.8 mg/dL — AB (ref 2.5–4.6)
Potassium: 4.3 mmol/L (ref 3.5–5.1)
SODIUM: 132 mmol/L — AB (ref 135–145)

## 2017-12-10 LAB — CBC
HCT: 38.2 % (ref 36.0–46.0)
HEMOGLOBIN: 11.1 g/dL — AB (ref 12.0–15.0)
MCH: 28.5 pg (ref 26.0–34.0)
MCHC: 29.1 g/dL — AB (ref 30.0–36.0)
MCV: 98.2 fL (ref 80.0–100.0)
Platelets: 296 10*3/uL (ref 150–400)
RBC: 3.89 MIL/uL (ref 3.87–5.11)
RDW: 22 % — ABNORMAL HIGH (ref 11.5–15.5)
WBC: 8.5 10*3/uL (ref 4.0–10.5)
nRBC: 0 % (ref 0.0–0.2)

## 2017-12-10 LAB — GLUCOSE, CAPILLARY
GLUCOSE-CAPILLARY: 170 mg/dL — AB (ref 70–99)
Glucose-Capillary: 76 mg/dL (ref 70–99)
Glucose-Capillary: 238 mg/dL — ABNORMAL HIGH (ref 70–99)
Glucose-Capillary: 73 mg/dL (ref 70–99)

## 2017-12-10 MED ORDER — LEVETIRACETAM 500 MG PO TABS
500.0000 mg | ORAL_TABLET | ORAL | Status: DC
  Administered 2017-12-11 – 2017-12-14 (×2): 500 mg via ORAL
  Filled 2017-12-10 (×2): qty 1

## 2017-12-10 MED ORDER — CEPHALEXIN 250 MG PO CAPS: 500.0000 mg | ORAL_CAPSULE | Freq: Two times a day (BID) | ORAL | Status: DC

## 2017-12-10 MED ORDER — CEPHALEXIN 250 MG PO CAPS
250.0000 mg | ORAL_CAPSULE | Freq: Two times a day (BID) | ORAL | Status: DC
  Administered 2017-12-10 – 2017-12-11 (×2): 250 mg via ORAL
  Filled 2017-12-10 (×2): qty 1

## 2017-12-10 MED ORDER — CALCIUM ACETATE (PHOS BINDER) 667 MG PO CAPS
1334.0000 mg | ORAL_CAPSULE | Freq: Three times a day (TID) | ORAL | Status: DC
  Administered 2017-12-10 – 2017-12-13 (×11): 1334 mg via ORAL
  Filled 2017-12-10 (×11): qty 2

## 2017-12-10 NOTE — Progress Notes (Signed)
Subjective:  No new c/o today - heard has been accepted at [facility] upon discharge but I dont think NH has been established   Objective Vital signs in last 24 hours: Vitals:   12/09/17 1445 12/09/17 2027 12/10/17 0500 12/10/17 0541  BP: (!) 166/83 132/72  129/68  Pulse: 76 80  78  Resp: 16 18  17   Temp: 97.7 F (36.5 C) 98.5 F (36.9 C)  98.3 F (36.8 C)  TempSrc:  Oral  Oral  SpO2: 100% 100%  100%  Weight:   44.3 kg   Height:       Weight change: -3.1 kg  Intake/Output Summary (Last 24 hours) at 12/10/2017 1131 Last data filed at 12/10/2017 0808 Gross per 24 hour  Intake 480 ml  Output 450 ml  Net 30 ml    Assessment/ Plan: Pt is a 62 y.o. yo female who was admitted on 11/29/2017 with recurrent A on CRF after pyelo- dialysis initiated 9/27 at [facility]- then was found to have CVA- transferred to [facility]  Assessment/Plan: 1. Renal- intermittently HD dep-  Done 10/12 then 10/19.  [place] placed 10/7.  Prior to most recent admission crt was around 4 and now I see that BUN and crt are worsening daily off of HD- I do not think is likely she will be able to do without HD- I think would be best to send out on HD but looking for signs of recovery.   Now looking into SNF ?? Has now been accepted at [facility].  I have not pursued perm access yet, maybe give trial of about 4-6 weeks and if still HD dep will get  - will run tomorrow instead of today because is going to be TTS as OP  2. HTN/vol-  NO BP meds here or recorded as OP- have started coreg since pulse high ,  Inc dose yest - needs UF with HD also for edema I think this will be the key  3. Anemia- hgb in 8's, improving - is on ESA- last iron stores OK 4. CVA- on rehab/keppra  5. GU- recent pyelo- stents in place - I guess that GU here wants her to get stents removed/changed where she had them put in at [facility]- not sure how practical that might be  6. Bones- PTH was 237- started vit D - phos now 8.1- added  binder    Erica [name] A Erica [name]    Labs: Basic Metabolic Panel: Recent Labs  Lab 12/06/17 0622 12/07/17 0501 12/08/17 1352  NA 132* 131* 134*  K 3.7 3.4* 3.5  CL 98 96* 96*  CO2 23 22 21*  GLUCOSE 112* 183* 194*  BUN 38* 49* 67*  CREATININE 4.37* 5.38* 6.35*  CALCIUM 8.5* 8.6* 8.9  PHOS 5.6* 6.6* 8.1*   Liver Function Tests: Recent Labs  Lab 12/07/17 0501 12/08/17 1352 12/09/17 0653  ALBUMIN 2.2* 2.3* 2.3*   No results for input(s): LIPASE, AMYLASE in the last 168 hours. No results for input(s): AMMONIA in the last 168 hours. CBC: Recent Labs  Lab 12/04/17 1417 12/08/17 1353 12/09/17 0653  WBC 9.1 8.2 8.7  HGB 8.7* 9.3* 9.7*  HCT 29.1* 31.2* 31.8*  MCV 96.0 97.2 97.2  PLT 359 350 304   Cardiac Enzymes: No results for input(s): CKTOTAL, CKMB, CKMBINDEX, TROPONINI in the last 168 hours. CBG: Recent Labs  Lab 12/09/17 0634 12/09/17 1158 12/09/17 1705 12/09/17 2136 12/10/17 0707  GLUCAP 135* 174* 269* 185* 76  Iron Studies: No results for input(s): IRON, TIBC, TRANSFERRIN, FERRITIN in the last 72 hours. Studies/Results: Dg Lumbar Spine 2-3 Views  Result Date: 12/08/2017 CLINICAL DATA:  Neurogenic claudication EXAM: LUMBAR SPINE - 2-3 VIEW COMPARISON:  CT 11/08/2017 FINDINGS: Bilateral ureteral stents are noted in place. Calcifications throughout the pancreas. Moderate to severe compression deformities noted at T10, T12, and the superior endplate of L2. These findings are stable since prior CT. Slight anterolisthesis of L4 on L5 related to facet disease, stable. Disc spaces maintained. SI joints symmetric and unremarkable. IMPRESSION: Stable chronic compression fractures at T10, T12 and superior endplate of L2. Degenerative facet disease with grade 1 anterolisthesis at L4-5. Chronic pancreatitis changes. Electronically Signed   By: Rolm Baptise M.D.   On: 12/08/2017 21:43   Medications: Infusions:   Scheduled Medications: . calcitRIOL  0.25 mcg Oral  Q T,Th,Sa-HD  . calcium acetate  667 mg Oral TID WC  . carvedilol  12.5 mg Oral BID WC  . Chlorhexidine Gluconate Cloth  6 each Topical Q0600  . darbepoetin (ARANESP) injection - DIALYSIS  150 mcg Subcutaneous Q Thu-1800  . feeding supplement (NEPRO CARB STEADY)  237 mL Oral BID WC  . heparin  5,000 Units Subcutaneous Q8H  . insulin aspart  0-5 Units Subcutaneous QHS  . insulin aspart  0-9 Units Subcutaneous TID WC  . insulin glargine  10 Units Subcutaneous Daily  . levETIRAcetam  1,000 mg Oral Daily  . levETIRAcetam  500 mg Oral Q M,W,F-HD  . multivitamin  1 tablet Oral QHS  . nicotine  14 mg Transdermal Daily  . phenytoin  150 mg Oral BID  . saccharomyces boulardii  250 mg Oral BID  . topiramate  25 mg Oral BID    have reviewed scheduled and prn medications.  Physical Exam: General: NAD- not great historian Heart: RRR Lungs: mostly clear Abdomen: distended, slightly tender Extremities: pitting edema Dialysis Access: TDC on right     12/10/2017,11:31 AM  LOS: 11 days

## 2017-12-10 NOTE — Progress Notes (Signed)
Occupational Therapy Session Note  Patient Details  Name: Erica [name] MRN: [id number] Date of Birth: 06-Oct-1955  Today's Date: 12/10/2017 OT Individual Time: 1016-1057 OT Individual Time Calculation (min): 41 min    Short Term Goals: Week 1:  OT Short Term Goal 1 (Week 1): Pt will complete LB dressing with supervision OT Short Term Goal 1 - Progress (Week 1): Met OT Short Term Goal 2 (Week 1): Pt will complete bathing with supervision at sit > stand level OT Short Term Goal 2 - Progress (Week 1): Met OT Short Term Goal 3 (Week 1): Pt will complete toilet transfers ambulating with LRAD OT Short Term Goal 3 - Progress (Week 1): Met OT Short Term Goal 4 (Week 1): Pt will complete 2 grooming tasks in standing to increase activity tolerance  OT Short Term Goal 4 - Progress (Week 1): Met  Skilled Therapeutic Interventions/Progress Updates:    1;1. Pt received seated in bed. Pt completes all mobility with supervision/VC for safety awareness. Pt completes bathing and dressing UB with set up in standing and LB with supervision. Pt able to locate all needed clothing items on L visual field.t requires VC for termination of bathing buttocks and moving on to other body parts. Pt bathes B feet crossing BLE into figure 4 without VC. Pt applies lotion prior to dressing and applies to feet. Pt requires VC for safety awareness to don shoes  Prior to standing to advance pants past hips as pt is wearing regular slippery socks on slick floor. Exited session with pt seated in recliner with call light in reach, belt alarm on and all needs met   Therapy Documentation Precautions:  Precautions Precautions: Fall Precaution Comments: seizures Restrictions Weight Bearing Restrictions: No General:   Vital Signs:   Pain: Pain Assessment Pain Scale: 0-10 Pain Score: 0-No pain ADL: ADL Eating: Set up Grooming: Setup, Minimal assistance Where Assessed-Grooming: Standing at sink Upper Body Bathing:  Minimal cueing, Minimal assistance Where Assessed-Upper Body Bathing: Sitting at sink Lower Body Bathing: Minimal cueing, Minimal assistance Where Assessed-Lower Body Bathing: (sit > stand at sink) Upper Body Dressing: Setup Where Assessed-Upper Body Dressing: Sitting at sink Lower Body Dressing: Minimal assistance Where Assessed-Lower Body Dressing: (sit > stand at sink) Toilet Transfer: Minimal assistance Toilet Transfer Method: Stand pivot Toilet Transfer Equipment: Bedside commode   Therapy/Group: Individual Therapy  Tonny Branch 12/10/2017, 10:25 AM

## 2017-12-10 NOTE — Progress Notes (Signed)
[place] PHYSICAL MEDICINE & REHABILITATION PROGRESS NOTE   Subjective/Complaints:    Patient complains of burning with urination, nursing is noted foul odor.  Output low as expected for ESRD, 450 cc this morning no output yesterday  ROS: Patient denies  nausea, vomiting, diarrhea, cough, shortness of breath or chest pain, joint or back pain,   Objective:   Dg Lumbar Spine 2-3 Views  Result Date: 12/08/2017 CLINICAL DATA:  Neurogenic claudication EXAM: LUMBAR SPINE - 2-3 VIEW COMPARISON:  CT 11/08/2017 FINDINGS: Bilateral ureteral stents are noted in place. Calcifications throughout the pancreas. Moderate to severe compression deformities noted at T10, T12, and the superior endplate of L2. These findings are stable since prior CT. Slight anterolisthesis of L4 on L5 related to facet disease, stable. Disc spaces maintained. SI joints symmetric and unremarkable. IMPRESSION: Stable chronic compression fractures at T10, T12 and superior endplate of L2. Degenerative facet disease with grade 1 anterolisthesis at L4-5. Chronic pancreatitis changes. Electronically Signed   By: Rolm Baptise M.D.   On: 12/08/2017 21:43   Recent Labs    12/08/17 1353 12/09/17 0653  WBC 8.2 8.7  HGB 9.3* 9.7*  HCT 31.2* 31.8*  PLT 350 304   Recent Labs    12/08/17 1352  NA 134*  K 3.5  CL 96*  CO2 21*  GLUCOSE 194*  BUN 67*  CREATININE 6.35*  CALCIUM 8.9    Intake/Output Summary (Last 24 hours) at 12/10/2017 0849 Last data filed at 12/10/2017 6314 Gross per 24 hour  Intake 480 ml  Output 450 ml  Net 30 ml     Physical Exam: Vital Signs Blood pressure 129/68, pulse 78, temperature 98.3 F (36.8 C), temperature source Oral, resp. rate 17, height 5' (1.524 m), weight 44.3 kg, SpO2 100 %. Constitutional: No distress . Vital signs reviewed. HEENT: EOMI, oral membranes moist Neck: supple Cardiovascular: RRR without murmur. No JVD    Respiratory: CTA Bilaterally without wheezes or rales. Normal  effort    GI: BS +, non-tender, non-distended  Musculoskeletal:  No edema or tenderness in extremities  Neurological: She is alert.  Speech fairly clear. Follows basic commands.  Motor: motor 4/5 throughout LUE ataxia Skin: Skin is warm and dry.  Psychiatric: cooperative and pleasant     Assessment/Plan: 1. 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  Physiatrist is providing close team supervision and 24 hour management of active medical problems listed below.  Physiatrist and rehab team continue to assess barriers to discharge/monitor patient progress toward functional and medical goals  Care Tool:  Bathing  Bathing activity did not occur: Refused Body parts bathed by patient: Right arm, Left arm, Chest, Abdomen, Right upper leg, Face, Left upper leg, Front perineal area, Buttocks, Right lower leg, Left lower leg         Bathing assist Assist Level: Supervision/Verbal cueing     Upper Body Dressing/Undressing Upper body dressing Upper body dressing/undressing activity did not occur (including orthotics): Refused What is the patient wearing?: Pull over shirt    Upper body assist Assist Level: Independent    Lower Body Dressing/Undressing Lower body dressing    Lower body dressing activity did not occur: Refused What is the patient wearing?: Pants, Incontinence brief     Lower body assist Assist for lower body dressing: Supervision/Verbal cueing     Toileting Toileting    Toileting assist Assist for toileting:  Moderate Assistance - Patient 50 - 74%     Transfers Chair/bed transfer  Transfers assist     Chair/bed transfer assist level: Supervision/Verbal cueing(RW)     Locomotion Ambulation   Ambulation assist      Assist level: Supervision/Verbal cueing Assistive device: Walker-rolling Max distance: 150 ft     Walk 10 feet activity   Assist     Assist level: Supervision/Verbal cueing Assistive device: Walker-rolling   Walk 50 feet activity   Assist    Assist level: Supervision/Verbal cueing Assistive device: Walker-rolling    Walk 150 feet activity   Assist Walk 150 feet activity did not occur: Safety/medical concerns  Assist level: Supervision/Verbal cueing Assistive device: Walker-rolling    Walk 10 feet on uneven surface  activity   Assist     Assist level: Supervision/Verbal cueing Assistive device: Aeronautical engineer Will patient use wheelchair at discharge?: No   Wheelchair activity did not occur: Safety/medical concerns         Wheelchair 50 feet with 2 turns activity    Assist    Wheelchair 50 feet with 2 turns activity did not occur: Safety/medical concerns       Wheelchair 150 feet activity     Assist Wheelchair 150 feet activity did not occur: Safety/medical concerns        Medical Problem List and Plan: 1.  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    ---Continue CIR therapies including PT, OT, and SLP  Stable for ALF 2.  DVT Prophylaxis/Anticoagulation: Pharmaceutical: Lovenox 3. Pain Management: tylenol prn. Leg /foot tingling with prolonged walking probably represents neurogenic claudication 4. Mood: LCSW to follow for evaluation and support.  5. Neuropsych: This patient is not fully capable of making decisions on her own behalf. 6. Skin/Wound Care: Routine pressure relief measures measures.  - added prn benadryl at night to help with itching 7. Fluids/Electrolytes/Nutrition: Strict I's and O's.   - Labs with dialysis. 8.  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   Pharmacy dosing on [facility]  -Neurology recommended to increase Topamax to 50 mg twice daily (on 25mg  currently)---no change at present 9.  History of hydronephrosis s/p stents UTI treated 10.  T2DM: Intake improving with improvement in mentation.  Low-dose Lantus resumed  .  -Increased Lantus insulin to 10 units daily ln 10/16, increase to 14units  -SSI coverage   CBG (last 3)  Recent Labs    12/09/17 1705 12/09/17 2136 12/10/17 0707  GLUCAP 269* 185* 76  controlled this am 10/25- avoid hypoglycemia, keep CBG >100  11.  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  Anemia of chronic disease:   Continue Aranesp.   14.  Daily soft BM with incont, not on laxatives  LOS: 11 days A FACE TO FACE EVALUATION WAS PERFORMED  Charlett Blake 12/10/2017, 8:49 AM

## 2017-12-10 NOTE — Progress Notes (Addendum)
Physical Therapy Session Note  Patient Details  Name: Erica [name] MRN: [id number] Date of Birth: [birth year]  Today's Date: 12/10/2017 PT Individual Time: 1105-1158 PT Individual Time Calculation (min): 53 min   Short Term Goals: Week 2:  PT Short Term Goal 1 (Week 2): STG = LTG due to anticipated d/c.   Skilled Therapeutic Interventions/Progress Updates:  Pt received in recliner & agreeable to tx, denying c/o pain. Pt completes sit>stand transfer with supervision with cuing to push up on armrests vs pulling on RW, and ambulates room>gym>dayroom>room with RW & supervision with increased gait speed with cuing. Provided pt with OTAGO Level A exercises & pt completed the following: long arc quads, hamstring curls, hip abduction, mini squats, tandem stance, and sit<>stands with BUE support with cuing for technique and UE support for balance. Pt required frequent seated rest breaks 2/2 fatigue. Attempted sit<>stand from chair without BUE support but pt unable without significant assistance. Pt utilized nu-step on level 4 x 10 minutes with all four extremities with task focusing on strengthening & endurance training & L NMR. At end of session pt left sitting in recliner with BLE elevated, call bell in reach, chair alarm donned & set up with meal tray.   Therapy Documentation Precautions:  Precautions Precautions: Fall Precaution Comments: seizures Restrictions Weight Bearing Restrictions: No    Therapy/Group: Individual Therapy  [name] 12/10/2017, 12:03 PM

## 2017-12-11 DIAGNOSIS — E119 Type 2 diabetes mellitus without complications: Secondary | ICD-10-CM

## 2017-12-11 LAB — RENAL FUNCTION PANEL
Albumin: 2.4 g/dL — ABNORMAL LOW (ref 3.5–5.0)
Anion gap: 13 (ref 5–15)
BUN: 70 mg/dL — AB (ref 8–23)
CHLORIDE: 96 mmol/L — AB (ref 98–111)
CO2: 21 mmol/L — AB (ref 22–32)
CREATININE: 6.02 mg/dL — AB (ref 0.44–1.00)
Calcium: 8.6 mg/dL — ABNORMAL LOW (ref 8.9–10.3)
GFR calc Af Amer: 8 mL/min — ABNORMAL LOW (ref >60)
GFR calc non Af Amer: 7 mL/min — ABNORMAL LOW (ref >60)
Glucose, Bld: 203 mg/dL — ABNORMAL HIGH (ref 70–99)
Phosphorus: 7.5 mg/dL — ABNORMAL HIGH (ref 2.5–4.6)
Potassium: 3.8 mmol/L (ref 3.5–5.1)
Sodium: 130 mmol/L — ABNORMAL LOW (ref 135–145)

## 2017-12-11 LAB — CBC
HEMATOCRIT: 31.5 % — AB (ref 36.0–46.0)
HEMOGLOBIN: 9.5 g/dL — AB (ref 12.0–15.0)
MCH: 29.4 pg (ref 26.0–34.0)
MCHC: 30.2 g/dL (ref 30.0–36.0)
MCV: 97.5 fL (ref 80.0–100.0)
Platelets: 286 10*3/uL (ref 150–400)
RBC: 3.23 MIL/uL — ABNORMAL LOW (ref 3.87–5.11)
RDW: 20.9 % — AB (ref 11.5–15.5)
WBC: 9.1 10*3/uL (ref 4.0–10.5)
nRBC: 0 % (ref 0.0–0.2)

## 2017-12-11 LAB — URINE CULTURE: CULTURE: NO GROWTH

## 2017-12-11 LAB — GLUCOSE, CAPILLARY
GLUCOSE-CAPILLARY: 327 mg/dL — AB (ref 70–99)
GLUCOSE-CAPILLARY: 267 mg/dL — AB (ref 70–99)
Glucose-Capillary: 169 mg/dL — ABNORMAL HIGH (ref 70–99)
Glucose-Capillary: 131 mg/dL — ABNORMAL HIGH (ref 70–99)

## 2017-12-11 MED ORDER — HEPARIN SODIUM (PORCINE) 1000 UNIT/ML IJ SOLN
INTRAMUSCULAR | Status: AC
  Administered 2017-12-11: 800 [IU] via INTRAVENOUS_CENTRAL
  Filled 2017-12-11: qty 4

## 2017-12-11 MED ORDER — HEPARIN SODIUM (PORCINE) 1000 UNIT/ML DIALYSIS
20.0000 [IU]/kg | INTRAMUSCULAR | Status: DC | PRN
  Administered 2017-12-11: 800 [IU] via INTRAVENOUS_CENTRAL
  Administered 2017-12-14: 3200 [IU] via INTRAVENOUS_CENTRAL
  Filled 2017-12-11 (×3): qty 1

## 2017-12-11 MED ORDER — CALCITRIOL 0.25 MCG PO CAPS
ORAL_CAPSULE | ORAL | Status: AC
  Administered 2017-12-11: 0.25 ug via ORAL
  Filled 2017-12-11: qty 1

## 2017-12-11 NOTE — Progress Notes (Signed)
Subjective:  No new c/o today - heard has been accepted at [facility] upon discharge but I dont think NH has been established   Objective Vital signs in last 24 hours: Vitals:   12/10/17 2007 12/10/17 2143 12/11/17 0529 12/11/17 0717  BP: (!) 146/71 (!) 165/82 (!) 151/75 (!) 161/77  Pulse: 84 89 88 83  Resp: 14  16   Temp: 100.1 F (37.8 C) 98.1 F (36.7 C) 99.1 F (37.3 C)   TempSrc:  Oral    SpO2: 98% 95% 98%   Weight:      Height:       Weight change:   Intake/Output Summary (Last 24 hours) at 12/11/2017 1344 Last data filed at 12/11/2017 0900 Gross per 24 hour  Intake 900 ml  Output -  Net 900 ml    Assessment/ Plan: Pt is a 62 y.o. yo female who was admitted on 11/29/2017 with recurrent A on CRF after pyelo- dialysis initiated 9/27 at [facility]- then was found to have CVA- transferred to [facility]  Assessment/Plan: 1. Renal- intermittently HD dep-  Done 10/12 then 10/19.  [place] placed 10/7.  Prior to most recent admission crt was around 4 and now I see that BUN and crt were worsening daily off of HD- I do not think is likely she will be able to do without HD- I think would be best to send out on HD but looking for signs of recovery.   Now looking into SNF ? Has now been accepted at [facility].  I have not pursued perm access yet, maybe give trial of about 4-6 weeks and if still HD dep will get  - will run today because is going to be TTS as OP  2. HTN/vol-  NO BP meds here or recorded as OP- have started coreg since pulse high ,  Inc dose - needs UF with HD also for edema I think this will be the key  3. Anemia- hgb in 8's, improving - is on ESA- last iron stores OK 4. CVA- on rehab/keppra  5. GU- recent pyelo- stents in place - I guess that GU here wants her to get stents removed/changed where she had them put in at [facility]- not sure how practical that might be  6. Bones- PTH was 237- started vit D - phos now 7.8- added phoslo    Rayshard Schirtzinger A  Shaelyn Decarli    Labs: Basic Metabolic Panel: Recent Labs  Lab 12/07/17 0501 12/08/17 1352 12/10/17 1303  NA 131* 134* 132*  K 3.4* 3.5 4.3  CL 96* 96* 97*  CO2 22 21* 20*  GLUCOSE 183* 194* 242*  BUN 49* 67* 44*  CREATININE 5.38* 6.35* 4.82*  CALCIUM 8.6* 8.9 9.2  PHOS 6.6* 8.1* 7.8*   Liver Function Tests: Recent Labs  Lab 12/08/17 1352 12/09/17 0653 12/10/17 1303  ALBUMIN 2.3* 2.3* 2.7*   No results for input(s): LIPASE, AMYLASE in the last 168 hours. No results for input(s): AMMONIA in the last 168 hours. CBC: Recent Labs  Lab 12/04/17 1417 12/08/17 1353 12/09/17 0653 12/10/17 1303  WBC 9.1 8.2 8.7 8.5  HGB 8.7* 9.3* 9.7* 11.1*  HCT 29.1* 31.2* 31.8* 38.2  MCV 96.0 97.2 97.2 98.2  PLT 359 350 304 296   Cardiac Enzymes: No results for input(s): CKTOTAL, CKMB, CKMBINDEX, TROPONINI in the last 168 hours. CBG: Recent Labs  Lab 12/10/17 1159 12/10/17 1648 12/10/17 2139 12/11/17 0638 12/11/17 1218  GLUCAP 238* 73 170* 169*  327*    Iron Studies: No results for input(s): IRON, TIBC, TRANSFERRIN, FERRITIN in the last 72 hours. Studies/Results: No results found. Medications: Infusions:   Scheduled Medications: . calcitRIOL  0.25 mcg Oral Q T,Th,Sa-HD  . calcium acetate  1,334 mg Oral TID WC  . carvedilol  12.5 mg Oral BID WC  . Chlorhexidine Gluconate Cloth  6 each Topical Q0600  . darbepoetin (ARANESP) injection - DIALYSIS  150 mcg Subcutaneous Q Thu-1800  . feeding supplement (NEPRO CARB STEADY)  237 mL Oral BID WC  . heparin  5,000 Units Subcutaneous Q8H  . insulin aspart  0-5 Units Subcutaneous QHS  . insulin aspart  0-9 Units Subcutaneous TID WC  . insulin glargine  10 Units Subcutaneous Daily  . levETIRAcetam  1,000 mg Oral Daily  . levETIRAcetam  500 mg Oral Q T,Th,Sa-HD  . multivitamin  1 tablet Oral QHS  . nicotine  14 mg Transdermal Daily  . phenytoin  150 mg Oral BID  . saccharomyces boulardii  250 mg Oral BID  . topiramate  25 mg  Oral BID    have reviewed scheduled and prn medications.  Physical Exam: General: NAD- not great historian Heart: RRR Lungs: mostly clear Abdomen: distended, slightly tender Extremities: pitting edema Dialysis Access: TDC on right     12/11/2017,1:44 PM  LOS: 12 days

## 2017-12-11 NOTE — Plan of Care (Signed)
  Problem: Spiritual Needs Goal: Ability to function at adequate level Outcome: Progressing   Problem: Consults Goal: RH STROKE PATIENT EDUCATION Description See Patient Education module for education specifics  Outcome: Progressing Goal: Nutrition Consult-if indicated Outcome: Progressing Goal: Diabetes Guidelines if Diabetic/Glucose > 140 Description If diabetic or lab glucose is > 140 mg/dl - Initiate Diabetes/Hyperglycemia Guidelines & Document Interventions  Outcome: Progressing   Problem: RH BOWEL ELIMINATION Goal: RH STG MANAGE BOWEL WITH ASSISTANCE Description STG Manage Bowel with mod I Assistance.  Outcome: Progressing   Problem: RH BLADDER ELIMINATION Goal: RH STG MANAGE BLADDER WITH ASSISTANCE Description STG Manage Bladder With mod I Assistance  Outcome: Progressing   Problem: RH SKIN INTEGRITY Goal: RH STG SKIN FREE OF INFECTION/BREAKDOWN Description Patients skin will remain free from further infection or breakdown with mod I assist.  Outcome: Progressing Goal: RH STG MAINTAIN SKIN INTEGRITY WITH ASSISTANCE Description STG Maintain Skin Integrity With mod I Assistance.  Outcome: Progressing   Problem: RH SAFETY Goal: RH STG ADHERE TO SAFETY PRECAUTIONS W/ASSISTANCE/DEVICE Description STG Adhere to Safety Precautions With mod I Assistance/Device.  Outcome: Progressing   Problem: RH KNOWLEDGE DEFICIT Goal: RH STG INCREASE KNOWLEDGE OF DIABETES Description Min assist  Outcome: Progressing Goal: RH STG INCREASE KNOWLEDGE OF HYPERTENSION Description Min assist  Outcome: Progressing Goal: RH STG INCREASE KNOWLEGDE OF HYPERLIPIDEMIA Description Min assist  Outcome: Progressing Goal: RH STG INCREASE KNOWLEDGE OF STROKE PROPHYLAXIS Description Min assist  Outcome: Progressing

## 2017-12-11 NOTE — Progress Notes (Signed)
[place] PHYSICAL MEDICINE & REHABILITATION PROGRESS NOTE   Subjective/Complaints: No new complaints this morning. Asked if she would have HD today  ROS: Patient denies fever, rash, sore throat, blurred vision, nausea, vomiting, diarrhea, cough, shortness of breath or chest pain, joint or back pain, headache, or mood change.    Objective:   No results found. Recent Labs    12/09/17 0653 12/10/17 1303  WBC 8.7 8.5  HGB 9.7* 11.1*  HCT 31.8* 38.2  PLT 304 296   Recent Labs    12/08/17 1352 12/10/17 1303  NA 134* 132*  K 3.5 4.3  CL 96* 97*  CO2 21* 20*  GLUCOSE 194* 242*  BUN 67* 44*  CREATININE 6.35* 4.82*  CALCIUM 8.9 9.2    Intake/Output Summary (Last 24 hours) at 12/11/2017 1219 Last data filed at 12/11/2017 0900 Gross per 24 hour  Intake 900 ml  Output -  Net 900 ml     Physical Exam: Vital Signs Blood pressure (!) 161/77, pulse 83, temperature 99.1 F (37.3 C), resp. rate 16, height 5' (1.524 m), weight 44.3 kg, SpO2 98 %. Constitutional: No distress . Vital signs reviewed. HEENT: EOMI, oral membranes moist Neck: supple Cardiovascular: RRR without murmur. No JVD    Respiratory: CTA Bilaterally without wheezes or rales. Normal effort    GI: BS +, non-tender, non-distended  Musculoskeletal:  No edema or tenderness in extremities  Neurological: She is alert.  Speech fairly clear. Follows basic commands.  Motor: motor 4/5 throughout LUE ataxia Skin: Skin is warm and dry.  Psychiatric: cooperative and pleasant     Assessment/Plan: 1. 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  Physiatrist is providing close team supervision and 24 hour management of active medical problems listed below.  Physiatrist and rehab team continue to assess barriers to discharge/monitor patient progress toward functional and  medical goals  Care Tool:  Bathing  Bathing activity did not occur: Refused Body parts bathed by patient: Right arm, Left arm, Chest, Abdomen, Right upper leg, Face, Left upper leg, Front perineal area, Buttocks, Right lower leg, Left lower leg         Bathing assist Assist Level: Supervision/Verbal cueing     Upper Body Dressing/Undressing Upper body dressing Upper body dressing/undressing activity did not occur (including orthotics): Refused What is the patient wearing?: Pull over shirt    Upper body assist Assist Level: Independent    Lower Body Dressing/Undressing Lower body dressing    Lower body dressing activity did not occur: Refused What is the patient wearing?: Pants, Incontinence brief     Lower body assist Assist for lower body dressing: Supervision/Verbal cueing     Toileting Toileting    Toileting assist Assist for toileting: Moderate Assistance - Patient 50 - 74%     Transfers Chair/bed transfer  Transfers assist     Chair/bed transfer assist level: Supervision/Verbal cueing(RW)     Locomotion Ambulation   Ambulation assist      Assist level: Supervision/Verbal cueing Assistive device: Walker-rolling Max distance: 100 ft   Walk 10 feet activity   Assist     Assist level: Supervision/Verbal cueing Assistive device: Walker-rolling   Walk 50 feet activity   Assist    Assist level: Supervision/Verbal cueing Assistive device: Walker-rolling    Walk 150 feet activity   Assist Walk 150 feet activity did not occur: Safety/medical concerns  Assist level:  Supervision/Verbal cueing Assistive device: Walker-rolling    Walk 10 feet on uneven surface  activity   Assist     Assist level: Supervision/Verbal cueing Assistive device: Aeronautical engineer Will patient use wheelchair at discharge?: No   Wheelchair activity did not occur: Safety/medical concerns         Wheelchair 50 feet with 2  turns activity    Assist    Wheelchair 50 feet with 2 turns activity did not occur: Safety/medical concerns       Wheelchair 150 feet activity     Assist Wheelchair 150 feet activity did not occur: Safety/medical concerns        Medical Problem List and Plan: 1.  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    ---Continue CIR therapies including PT, OT, and SLP    Stable for ALF 2.  DVT Prophylaxis/Anticoagulation: Pharmaceutical: Lovenox 3. Pain Management: tylenol prn. Leg /foot tingling with prolonged walking probably represents neurogenic claudication 4. Mood: LCSW to follow for evaluation and support.  5. Neuropsych: This patient is not fully capable of making decisions on her own behalf. 6. Skin/Wound Care: Routine pressure relief measures measures.    7. Fluids/Electrolytes/Nutrition: Strict I's and O's.   - Labs with dialysis. 8.  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   Pharmacy dosing on [facility]  -continue topamax 25mg  bid 9.  History of hydronephrosis s/p stents UTI treated 10.  T2DM: Intake improving with improvement in mentation.  Low-dose Lantus resumed  .  -  Lantus insulin to 10 units daily    -SSI coverage   CBG (last 3)  Recent Labs    12/10/17 1648 12/10/17 2139 12/11/17 0638  GLUCAP 73 170* 169*  inconsistent readings. Follow for pattern prior to further change 11.  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  Anemia of chronic disease:   Continue Aranesp.   14.  Daily soft BM with incont, not on laxatives 15. UA +, UCX negative, dc keflex  LOS: 12 days A FACE TO FACE  EVALUATION WAS PERFORMED  Meredith Staggers 12/11/2017, 12:19 PM

## 2017-12-12 ENCOUNTER — Inpatient Hospital Stay (HOSPITAL_COMMUNITY): Payer: Medicaid Other | Admitting: Occupational Therapy

## 2017-12-12 LAB — GLUCOSE, CAPILLARY
GLUCOSE-CAPILLARY: 165 mg/dL — AB (ref 70–99)
GLUCOSE-CAPILLARY: 231 mg/dL — AB (ref 70–99)
GLUCOSE-CAPILLARY: 110 mg/dL — AB (ref 70–99)
GLUCOSE-CAPILLARY: 229 mg/dL — AB (ref 70–99)

## 2017-12-12 MED ORDER — INSULIN GLARGINE 100 UNIT/ML ~~LOC~~ SOLN
12.0000 [IU] | Freq: Every day | SUBCUTANEOUS | Status: DC
  Administered 2017-12-13 – 2017-12-14 (×2): 12 [IU] via SUBCUTANEOUS
  Filled 2017-12-12 (×5): qty 0.12

## 2017-12-12 NOTE — Progress Notes (Signed)
Occupational Therapy Session Note  Patient Details  Name: Erica [name] MRN: [id number] Date of Birth: Sep 06, 1955  Today's Date: 12/12/2017 OT Individual Time: 1164-3539 OT Individual Time Calculation (min): 39 min   Short Term Goals: Week 2:  OT Short Term Goal 1 (Week 2): STG = LTGs due to remaining LOS  Skilled Therapeutic Interventions/Progress Updates:    Pt greeted for scheduled AM therapy and had just started breakfast. Tx deferred to PM with pt agreeable to session at this time. No c/o pain and ADL needs met. Motivated to go outdoors. After donning shoes with setup, pt ambulated short distance to w/c near sink. She was escorted outside by OT. Tx focus placed on higher level balance. Pt ambulated 250 ft around fountain using RW with cues for forward gaze when navigating over uneven terrain. She transferred to community bench and we discussed leisure participation at next venue of care. Pt expressed interest in playing checkers and pitch. Educated pt on physical as well as psychosocial health benefits of continuing with meaningful occupation in group environment. Pt appeared receptive to education. She was then taken back to room via w/c, and ambulated back to bed in manner as written above. Pt left in bed with all needs at session exit.     Therapy Documentation Precautions:  Precautions Precautions: Fall Precaution Comments: seizures Restrictions Weight Bearing Restrictions: No Vital Signs: Therapy Vitals Temp: 98.5 F (36.9 C) Pulse Rate: 80 Resp: 16 BP: (!) 128/55 Patient Position (if appropriate): Lying Oxygen Therapy SpO2: 98 % O2 Device: Room Air Pain: No c/o pain during tx    ADL: ADL Eating: Set up Grooming: Setup, Minimal assistance Where Assessed-Grooming: Standing at sink Upper Body Bathing: Minimal cueing, Minimal assistance Where Assessed-Upper Body Bathing: Sitting at sink Lower Body Bathing: Minimal cueing, Minimal assistance Where Assessed-Lower  Body Bathing: (sit > stand at sink) Upper Body Dressing: Setup Where Assessed-Upper Body Dressing: Sitting at sink Lower Body Dressing: Minimal assistance Where Assessed-Lower Body Dressing: (sit > stand at sink) Toilet Transfer: Minimal assistance Toilet Transfer Method: Stand pivot Toilet Transfer Equipment: Bedside commode      Therapy/Group: Individual Therapy  Frisco Cordts A Morene Cecilio 12/12/2017, 4:36 PM

## 2017-12-12 NOTE — Progress Notes (Signed)
Subjective:   HD yest, removed 3000- tolerated well - heard has been accepted at [place] upon discharge but I dont think NH has been established - watching cartoons - has coloring books  Objective Vital signs in last 24 hours: Vitals:   12/11/17 1754 12/11/17 1757 12/11/17 2016 12/12/17 0455  BP: (!) 147/82 (!) 153/74 (!) 153/66 (!) 144/71  Pulse: 81 85 85 87  Resp: 16 16 16 16   Temp: 98 F (36.7 C)  100 F (37.8 C) 98.7 F (37.1 C)  TempSrc: Oral     SpO2: 98% 98% 98% 98%  Weight: 42.8 kg   42.5 kg  Height:       Weight change:   Intake/Output Summary (Last 24 hours) at 12/12/2017 1406 Last data filed at 12/12/2017 0700 Gross per 24 hour  Intake 600 ml  Output 3000 ml  Net -2400 ml    Assessment/ Plan: Pt is a 62 y.o. yo female who was admitted on 11/29/2017 with recurrent A on CRF after pyelo- dialysis initiated 9/27 at [facility]- then was found to have CVA- transferred to [facility]  Assessment/Plan: 1. Renal- intermittently HD dep-  Done 10/12 then 10/19.  Maxbass placed 10/7.  Prior to most recent admission crt was around 4 and now I see that BUN and crt were worsening daily off of HD- I do not think is likely she will be able to do without HD- I think would be best to send out on HD but continuing to look for signs of recovery.   Now looking into SNF ? Has now been accepted at [facility].  I have not pursued perm access yet, maybe give trial of about 4-6 weeks and if still HD dep will get  - will cont on TTS schedule  2. HTN/vol-  NO BP meds here or recorded as OP- have started coreg since pulse high ,   - needs UF with HD also for edema I think this will be the key  3. Anemia- hgb in 8's, improving, now 9.5 - is on ESA- last iron stores OK 4. CVA- on rehab/keppra  5. GU- recent pyelo- stents in place - I guess that GU here wants her to get stents removed/changed where she had them put in at [facility]- not sure how practical that might be  6. Bones- PTH  was 237- started vit D - phos now 7.5- added phoslo - coming down    [facility]    Labs: Basic Metabolic Panel: Recent Labs  Lab 12/08/17 1352 12/10/17 1303 12/11/17 1330  NA 134* 132* 130*  K 3.5 4.3 3.8  CL 96* 97* 96*  CO2 21* 20* 21*  GLUCOSE 194* 242* 203*  BUN 67* 44* 70*  CREATININE 6.35* 4.82* 6.02*  CALCIUM 8.9 9.2 8.6*  PHOS 8.1* 7.8* 7.5*   Liver Function Tests: Recent Labs  Lab 12/09/17 0653 12/10/17 1303 12/11/17 1330  ALBUMIN 2.3* 2.7* 2.4*   No results for input(s): LIPASE, AMYLASE in the last 168 hours. No results for input(s): AMMONIA in the last 168 hours. CBC: Recent Labs  Lab 12/08/17 1353 12/09/17 0653 12/10/17 1303 12/11/17 1311  WBC 8.2 8.7 8.5 9.1  HGB 9.3* 9.7* 11.1* 9.5*  HCT 31.2* 31.8* 38.2 31.5*  MCV 97.2 97.2 98.2 97.5  PLT 350 304 296 286   Cardiac Enzymes: No results for input(s): CKTOTAL, CKMB, CKMBINDEX, TROPONINI in the last 168 hours. CBG: Recent Labs  Lab 12/11/17 1218 12/11/17 1754  12/11/17 2124 12/12/17 0631 12/12/17 1204  GLUCAP 327* 131* 267* 165* 231*    Iron Studies: No results for input(s): IRON, TIBC, TRANSFERRIN, FERRITIN in the last 72 hours. Studies/Results: No results found. Medications: Infusions:   Scheduled Medications: . calcitRIOL  0.25 mcg Oral Q T,Th,Sa-HD  . calcium acetate  1,334 mg Oral TID WC  . carvedilol  12.5 mg Oral BID WC  . Chlorhexidine Gluconate Cloth  6 each Topical Q0600  . darbepoetin (ARANESP) injection - DIALYSIS  150 mcg Subcutaneous Q Thu-1800  . feeding supplement (NEPRO CARB STEADY)  237 mL Oral BID WC  . heparin  5,000 Units Subcutaneous Q8H  . insulin aspart  0-5 Units Subcutaneous QHS  . insulin aspart  0-9 Units Subcutaneous TID WC  . [START ON 12/13/2017] insulin glargine  12 Units Subcutaneous Daily  . levETIRAcetam  1,000 mg Oral Daily  . levETIRAcetam  500 mg Oral Q T,Th,Sa-HD  . multivitamin  1 tablet Oral QHS  . nicotine  14 mg Transdermal  Daily  . phenytoin  150 mg Oral BID  . saccharomyces boulardii  250 mg Oral BID  . topiramate  25 mg Oral BID    have reviewed scheduled and prn medications.  Physical Exam: General: NAD- not great historian Heart: RRR Lungs: mostly clear Abdomen: distended, slightly tender Extremities: pitting edema Dialysis Access: TDC on right     12/12/2017,2:06 PM  LOS: 13 days

## 2017-12-12 NOTE — Progress Notes (Signed)
[place] for phenytoin dosing Indication: Patient safety/ therapeutic monitoring  Allergies  Allergen Reactions  . Aspirin Palpitations   Patient Measurements: Height: 5' (152.4 cm) Weight: 93 lb 11.1 oz (42.5 kg) IBW/kg (Calculated) : 45.5  Vital Signs: Temp: 98.7 F (37.1 C) (10/27 0455) BP: 144/71 (10/27 0455) Pulse Rate: 87 (10/27 0455) Intake/Output from previous day: 10/26 0701 - 10/27 0700 In: 1080 [P.O.:1080] Out: 3000  Intake/Output from this shift: No intake/output data recorded.  Labs: Recent Labs    12/10/17 1303 12/11/17 1311 12/11/17 1330  WBC 8.5 9.1  --   HGB 11.1* 9.5*  --   HCT 38.2 31.5*  --   PLT 296 286  --   CREATININE 4.82*  --  6.02*  PHOS 7.8*  --  7.5*  ALBUMIN 2.7*  --  2.4*   Estimated Creatinine Clearance: 6.4 mL/min (A) (by C-G formula based on SCr of 6.02 mg/dL (H)).  Assessment: 62 yo F recently initiated on dialysis started on phenytoin inpatient for breakthrough seizures. Neurology initially followed pt & wanted pharmacy to take over dosing for phenytoin. Pt's dose was reduced from 100mg  q8h to 125mg  q12h on 10/2.   Last total phenytoin level was 4.3 on 10/24  which corrects to ~13 with low albumin with new chronic HD. No further seizures noted. No seizures noted.  Monitoring Corrected phenytoin level = 10-51mcg/mL  Plan:  Continue phenytoin 150mg  PO BID Monitor clinical status, seizures, phenytoin levels prn  Alanda Slim, PharmD, [facility] Clinical Pharmacist Please see AMION for all Pharmacists' Contact Phone Numbers 12/12/2017, 8:38 AM

## 2017-12-12 NOTE — Progress Notes (Signed)
[facility] PHYSICAL MEDICINE & REHABILITATION PROGRESS NOTE   Subjective/Complaints: No new complaints. Slept well  ROS: Patient denies fever, rash, sore throat, blurred vision, nausea, vomiting, diarrhea, cough, shortness of breath or chest pain, joint or back pain, headache, or mood change.    Objective:   No results found. Recent Labs    12/10/17 1303 12/11/17 1311  WBC 8.5 9.1  HGB 11.1* 9.5*  HCT 38.2 31.5*  PLT 296 286   Recent Labs    12/10/17 1303 12/11/17 1330  NA 132* 130*  K 4.3 3.8  CL 97* 96*  CO2 20* 21*  GLUCOSE 242* 203*  BUN 44* 70*  CREATININE 4.82* 6.02*  CALCIUM 9.2 8.6*    Intake/Output Summary (Last 24 hours) at 12/12/2017 1135 Last data filed at 12/12/2017 0700 Gross per 24 hour  Intake 840 ml  Output 3000 ml  Net -2160 ml     Physical Exam: Vital Signs Blood pressure (!) 144/71, pulse 87, temperature 98.7 F (37.1 C), resp. rate 16, height 5' (1.524 m), weight 42.5 kg, SpO2 98 %. Constitutional: No distress . Vital signs reviewed. HEENT: EOMI, oral membranes moist Neck: supple Cardiovascular: RRR without murmur. No JVD    Respiratory: CTA Bilaterally without wheezes or rales. Normal effort    GI: BS +, non-tender, non-distended  Musculoskeletal:  No edema or tenderness in extremities  Neurological: She is alert.  Speech fairly clear. Follows basic commands.  Motor: motor 4/5 throughout LUE ataxia Skin: Skin is warm and dry.  Psychiatric: cooperative and pleasant     Assessment/Plan: 1. 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  Physiatrist is providing close team supervision and 24 hour management of active medical problems listed below.  Physiatrist and rehab team continue to assess barriers to discharge/monitor patient progress toward functional and medical goals  Care  Tool:  Bathing  Bathing activity did not occur: Refused Body parts bathed by patient: Right arm, Left arm, Chest, Abdomen, Right upper leg, Face, Left upper leg, Front perineal area, Buttocks, Right lower leg, Left lower leg         Bathing assist Assist Level: Supervision/Verbal cueing     Upper Body Dressing/Undressing Upper body dressing Upper body dressing/undressing activity did not occur (including orthotics): Refused What is the patient wearing?: Pull over shirt    Upper body assist Assist Level: Independent    Lower Body Dressing/Undressing Lower body dressing    Lower body dressing activity did not occur: Refused What is the patient wearing?: Pants, Incontinence brief     Lower body assist Assist for lower body dressing: Supervision/Verbal cueing     Toileting Toileting    Toileting assist Assist for toileting: Moderate Assistance - Patient 50 - 74%     Transfers Chair/bed transfer  Transfers assist     Chair/bed transfer assist level: Supervision/Verbal cueing(RW)     Locomotion Ambulation   Ambulation assist      Assist level: Supervision/Verbal cueing Assistive device: Walker-rolling Max distance: 100 ft   Walk 10 feet activity   Assist     Assist level: Supervision/Verbal cueing Assistive device: Walker-rolling   Walk 50 feet activity   Assist    Assist level: Supervision/Verbal cueing Assistive device: Walker-rolling    Walk 150 feet activity   Assist Walk 150 feet activity did not occur: Safety/medical concerns  Assist level: Supervision/Verbal cueing Assistive device: Walker-rolling  Walk 10 feet on uneven surface  activity   Assist     Assist level: Supervision/Verbal cueing Assistive device: Aeronautical engineer Will patient use wheelchair at discharge?: No   Wheelchair activity did not occur: Safety/medical concerns         Wheelchair 50 feet with 2 turns  activity    Assist    Wheelchair 50 feet with 2 turns activity did not occur: Safety/medical concerns       Wheelchair 150 feet activity     Assist Wheelchair 150 feet activity did not occur: Safety/medical concerns        Medical Problem List and Plan: 1.  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    ---Continue CIR therapies including PT, OT, and SLP    Stable for ALF 2.  DVT Prophylaxis/Anticoagulation: Pharmaceutical: Lovenox 3. Pain Management: tylenol prn. Leg /foot tingling with prolonged walking probably represents neurogenic claudication 4. Mood: LCSW to follow for evaluation and support.  5. Neuropsych: This patient is not fully capable of making decisions on her own behalf. 6. Skin/Wound Care: Routine pressure relief measures measures.    7. Fluids/Electrolytes/Nutrition: Strict I's and O's.   - Labs with dialysis. 8.  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   Pharmacy dosing on DPH  -continue topamax 25mg  bid 9.  History of hydronephrosis s/p stents UTI treated 10.  T2DM: Intake improving with improvement in mentation.  Low-dose Lantus resumed  .  -  Lantus insulin to 10 units daily    -SSI coverage   CBG (last 3)  Recent Labs    12/11/17 1754 12/11/17 2124 12/12/17 0631  GLUCAP 131* 267* 165*  increase lantus to 12 u daily 11.  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  Anemia of chronic disease:   Continue Aranesp.   14.  Daily soft BM with incont, not on laxatives 15. UA +, UCX negative. off keflex  LOS: 13 days A FACE TO [place] 12/12/2017, 11:35 AM

## 2017-12-13 ENCOUNTER — Encounter (HOSPITAL_COMMUNITY): Payer: Self-pay | Admitting: Anesthesiology

## 2017-12-13 ENCOUNTER — Inpatient Hospital Stay (HOSPITAL_COMMUNITY): Payer: Medicaid Other | Admitting: Physical Therapy

## 2017-12-13 ENCOUNTER — Inpatient Hospital Stay (HOSPITAL_COMMUNITY): Payer: Medicaid Other | Admitting: Occupational Therapy

## 2017-12-13 DIAGNOSIS — I6523 Occlusion and stenosis of bilateral carotid arteries: Secondary | ICD-10-CM

## 2017-12-13 DIAGNOSIS — N186 End stage renal disease: Secondary | ICD-10-CM

## 2017-12-13 DIAGNOSIS — E119 Type 2 diabetes mellitus without complications: Secondary | ICD-10-CM

## 2017-12-13 DIAGNOSIS — Z992 Dependence on renal dialysis: Secondary | ICD-10-CM

## 2017-12-13 LAB — GLUCOSE, CAPILLARY
GLUCOSE-CAPILLARY: 161 mg/dL — AB (ref 70–99)
Glucose-Capillary: 123 mg/dL — ABNORMAL HIGH (ref 70–99)
Glucose-Capillary: 246 mg/dL — ABNORMAL HIGH (ref 70–99)
Glucose-Capillary: 247 mg/dL — ABNORMAL HIGH (ref 70–99)

## 2017-12-13 MED ORDER — CEFAZOLIN SODIUM-DEXTROSE 1-4 GM/50ML-% IV SOLN
1.0000 g | INTRAVENOUS | Status: DC
  Filled 2017-12-13: qty 50

## 2017-12-13 NOTE — Progress Notes (Signed)
Physical Therapy Session Note  Patient Details  Name: Erica [name] MRN: [id number] Date of Birth: [birth year]  Today's Date: 12/13/2017 PT Individual Time: 9432-7614 PT Individual Time Calculation (min): 23 min   Short Term Goals: Week 2:  PT Short Term Goal 1 (Week 2): STG = LTG due to anticipated d/c.   Skilled Therapeutic Interventions/Progress Updates:  Pt received in recliner & agreeable to tx. Pt completes sit<>stand transfers with supervision and ambulates room<>ortho gym with RW, supervision and decreased gait speed but is able to increase speed when returning to room with verbal cuing. Pt completes car transfer from sedan simulated height with supervision. Pt ambulates ~30 ft x 3 (recliner to door & back three times) without AD & min<>mod assist, except max assist & cuing for turning, with task focusing on dynamic balance and weight shifting L with improvement with balance with each trial.  At end of session pt left sitting in recliner with chair alarm donned & all needs at hand.  Therapy Documentation Precautions:  Precautions Precautions: Fall Precaution Comments: seizures Restrictions Weight Bearing Restrictions: No  Pain: No c/o pain during session.   Therapy/Group: Individual Therapy  [name] 12/13/2017, 11:23 AM

## 2017-12-13 NOTE — Anesthesia Preprocedure Evaluation (Deleted)
Anesthesia Evaluation    Reviewed: Allergy & Precautions, Patient's Chart, lab work & pertinent test results  History of Anesthesia Complications Negative for: history of anesthetic complications  Airway        Dental   Pulmonary neg pulmonary ROS,           Cardiovascular hypertension, Pt. on home beta blockers    '19 TTE - Moderate focal basal septal hypertrophy. Mild hypertrophy of the rest of the LV. EF 60% to 65%. Grade 1 diastolic dysfunction. Trivial AI. There was chordal systolic anterior motion but not valvular SAM. Trivial MR. PA peak pressure: 50 mm Hg  '19 Carotid US - 1-39% b/l ICAS    Neuro/Psych Seizures -,  PSYCHIATRIC DISORDERS Depression CVA    GI/Hepatic GERD  ,(+)     substance abuse  alcohol use,  Gastroparesis    Endo/Other  diabetes, Insulin Dependent Hyponatremia Hypochloremia   Renal/GU ESRF and DialysisRenal disease     Musculoskeletal negative musculoskeletal ROS (+)   Abdominal   Peds  Hematology  (+) anemia ,   Anesthesia Other Findings   Reproductive/Obstetrics                             Anesthesia Physical Anesthesia Plan  ASA: IV  Anesthesia Plan: General   Post-op Pain Management:    Induction: Intravenous and Rapid sequence  PONV Risk Score and Plan: 3 and Treatment may vary due to age or medical condition, Ondansetron and Midazolam  Airway Management Planned: Oral ETT  Additional Equipment: None  Intra-op Plan:   Post-operative Plan: Extubation in OR  Informed Consent:   Plan Discussed with: CRNA and Anesthesiologist  Anesthesia Plan Comments:         Anesthesia Quick Evaluation

## 2017-12-13 NOTE — Progress Notes (Addendum)
Social Work Patient ID: Erica [name], female   DOB: 1955-04-04, 62 y.o.   MRN: [id number]  Have spoken with [facility] who will see if can provide transportation to [facility] before can take pt. Have expanded search for facility to take pt, will inform son.

## 2017-12-13 NOTE — Plan of Care (Signed)
  Problem: Spiritual Needs Goal: Ability to function at adequate level Outcome: Progressing   Problem: Consults Goal: RH STROKE PATIENT EDUCATION Description See Patient Education module for education specifics  Outcome: Progressing Goal: Nutrition Consult-if indicated Outcome: Progressing Goal: Diabetes Guidelines if Diabetic/Glucose > 140 Description If diabetic or lab glucose is > 140 mg/dl - Initiate Diabetes/Hyperglycemia Guidelines & Document Interventions  Outcome: Progressing   Problem: RH BOWEL ELIMINATION Goal: RH STG MANAGE BOWEL WITH ASSISTANCE Description STG Manage Bowel with mod I Assistance.  Outcome: Progressing   Problem: RH BLADDER ELIMINATION Goal: RH STG MANAGE BLADDER WITH ASSISTANCE Description STG Manage Bladder With mod I Assistance  Outcome: Progressing   Problem: RH SKIN INTEGRITY Goal: RH STG SKIN FREE OF INFECTION/BREAKDOWN Description Patients skin will remain free from further infection or breakdown with mod I assist.  Outcome: Progressing Goal: RH STG MAINTAIN SKIN INTEGRITY WITH ASSISTANCE Description STG Maintain Skin Integrity With mod I Assistance.  Outcome: Progressing   Problem: RH SAFETY Goal: RH STG ADHERE TO SAFETY PRECAUTIONS W/ASSISTANCE/DEVICE Description STG Adhere to Safety Precautions With mod I Assistance/Device.  Outcome: Progressing   Problem: RH KNOWLEDGE DEFICIT Goal: RH STG INCREASE KNOWLEDGE OF DIABETES Description Min assist  Outcome: Progressing Goal: RH STG INCREASE KNOWLEDGE OF HYPERTENSION Description Min assist  Outcome: Progressing Goal: RH STG INCREASE KNOWLEGDE OF HYPERLIPIDEMIA Description Min assist  Outcome: Progressing Goal: RH STG INCREASE KNOWLEDGE OF STROKE PROPHYLAXIS Description Min assist  Outcome: Progressing

## 2017-12-13 NOTE — Consult Note (Addendum)
Hospital Consult    Reason for Consult:  In need of permanent dialysis access Requesting Physician:  Augustin Coupe MRN #:  [id number]  History of Present Illness: This is a 62 y.o. female who recently had phyelonephritis and ATN.  She had bilateral ureteral stents placed in August at [facility] in [place].  UTI treated as well.   She was admitted to [facility] in September with acute on chronic renal failure.  She was initiated on dialysis by Dr. Lowanda Foster.  She developed left sided weakness and was transferred to [facility].  MR of the brain revealed scattered punctate foci of acute infarction manifest as foci of restricted diffusion within the deep white matter at both frontal parietal vertices. 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  She is on Keppra.  She had carotid duplex that revealed 1-39% bilateral ICA stenosis.  She had a TDC placed by IR on 11/22/17.  Renal feels that she will need dialysis and has requested VVS consult for permanent dialysis access.    She has hx of diabetes and is on insulin.  She has HTN and is on a beta blocker.  She is on SQ heparin for DVT prophylaxis.   Most of the history obtained from chart as she states her memory is not good.  Past Medical History:  Diagnosis Date  . Alcohol-induced pancreatitis   . Chronic diarrhea   . Depression   . Diabetes mellitus    fasting blood sugar 110-120s  . Diastolic CHF ([place])   . DKA (diabetic ketoacidoses) ([place])   . Gastroparesis   . GERD (gastroesophageal reflux disease)   . Heart murmur   . History of kidney stones   . Hyperlipidemia   . Hypertension   . Hypokalemia   . Muscle spasm   . Neuropathic pain   . Neuropathy    Hx: of  . Pyelonephritis   . Recurrent pancreatitis   . Seizures ([place])   . Vitamin B12 deficiency   . Vitamin D deficiency     Past Surgical History:  Procedure Laterality Date  . CATARACT EXTRACTION W/PHACO Right  03/14/2015   Procedure: CATARACT EXTRACTION PHACO AND INTRAOCULAR LENS PLACEMENT (IOC);  Surgeon: Baruch Goldmann, MD;  Location: [facility];  Service: Ophthalmology;  Laterality: Right;  CDE:11.13  . CATARACT EXTRACTION W/PHACO Left 04/11/2015   Procedure: CATARACT EXTRACTION PHACO AND INTRAOCULAR LENS PLACEMENT LEFT EYE CDE=9.68;  Surgeon: Baruch Goldmann, MD;  Location: [facility];  Service: Ophthalmology;  Laterality: Left;  . COLONOSCOPY  02/24/2010  . cystoscopy with ureteral stent  Bilateral 10/07/2017   At [place] CV LINE RIGHT  11/17/2017  . IR FLUORO GUIDE CV LINE RIGHT  11/22/2017  . IR US GUIDE VASC ACCESS RIGHT  11/17/2017  . MULTIPLE EXTRACTIONS WITH ALVEOLOPLASTY N/A 06/13/2012   Procedure: MULTIPLE EXTRACION WITH ALVEOLOPLASTY EXTRACT: 18, 19, 20, 21, 22, 24, 25, 27, 28, 29, 30, 31;  Surgeon: Gae Bon, DDS;  Location: [place];  Service: Oral Surgery;  Laterality: N/A;  . TUBAL LIGATION    . URETERAL STENT PLACEMENT  09/2017    Allergies  Allergen Reactions  . Aspirin Palpitations    Prior to Admission medications   Medication Sig Start Date End Date Taking? Authorizing Provider  insulin aspart (NOVOLOG) 100 UNIT/ML injection Inject 0-9 Units into the skin 3 (three) times daily with meals. For glucose 121-150 use one unit, for 151-200 use 2  units, for 201 to 250 use 3 units, for 251-300 use 5 units, for 301-350 use 7 units, for 351 or greater use 9 unnits. 11/29/17 12/29/17  Arrien, Jimmy Picket, MD  insulin glargine (LANTUS) 100 UNIT/ML injection Inject 0.05 mLs (5 Units total) into the skin daily. 11/30/17 12/30/17  Arrien, Jimmy Picket, MD  levETIRAcetam (KEPPRA) 1000 MG tablet Take 1 tablet (1,000 mg total) by mouth daily. 11/30/17 12/30/17  Arrien, Jimmy Picket, MD  levETIRAcetam (KEPPRA) 500 MG tablet Take 1 tablet (500 mg total) by mouth every Tuesday, Thursday, and Saturday at 6 PM. 11/30/17 12/30/17  Arrien, Jimmy Picket, MD  multivitamin  (RENA-VIT) TABS tablet Take 1 tablet by mouth at bedtime. 11/29/17 12/29/17  Arrien, Jimmy Picket, MD  Nutritional Supplements (FEEDING SUPPLEMENT, NEPRO CARB STEADY,) LIQD Take 237 mLs by mouth 2 (two) times daily between meals. 11/29/17 12/29/17  Arrien, Jimmy Picket, MD  phenytoin (DILANTIN) 125 MG/5ML suspension Take 5 mLs (125 mg total) by mouth 2 (two) times daily. 11/29/17   Arrien, Jimmy Picket, MD  topiramate (TOPAMAX) 25 MG tablet Take 1 tablet (25 mg total) by mouth 2 (two) times daily. 11/29/17 12/29/17  Arrien, Jimmy Picket, MD    Social History   Socioeconomic History  . Marital status: Legally Separated    Spouse name: Not on file  . Number of children: Not on file  . Years of education: Not on file  . Highest education level: Not on file  Occupational History  . Not on file  Social Needs  . Financial resource strain: Not on file  . Food insecurity:    Worry: Not on file    Inability: Not on file  . Transportation needs:    Medical: Not on file    Non-medical: Not on file  Tobacco Use  . Smoking status: Never Smoker  . Smokeless tobacco: Current User    Types: Snuff  Substance and Sexual Activity  . Alcohol use: Yes    Alcohol/week: 1.0 standard drinks    Types: 1 Cans of beer per week    Comment: "sometimes i drink twice a week"  . Drug use: No  . Sexual activity: Not Currently    Comment: last drink 05/19/12 states 2 drinks 3 x's a week  Lifestyle  . Physical activity:    Days per week: Not on file    Minutes per session: Not on file  . Stress: Not on file  Relationships  . Social connections:    Talks on phone: Not on file    Gets together: Not on file    Attends religious service: Not on file    Active member of club or organization: Not on file    Attends meetings of clubs or organizations: Not on file    Relationship status: Not on file  . Intimate partner violence:    Fear of current or ex partner: Not on file    Emotionally abused:  Not on file    Physically abused: Not on file    Forced sexual activity: Not on file  Other Topics Concern  . Not on file  Social History Narrative  . Not on file     Family History  Problem Relation Age of Onset  . Diabetes Sister   . Chronic Renal Failure Neg Hx     ROS: [x]  Positive   [ ]  Negative   [ ]  All sytems reviewed and are negative  Cardiac: []  chest pain/pressure []  palpitations []  SOB lying flat []   DOE  Vascular: []  pain in legs while walking []  pain in legs at rest []  pain in legs at night []  non-healing ulcers []  hx of DVT []  swelling in legs  Pulmonary: []  productive cough []  asthma/wheezing []  home O2  Neurologic: [x]  weakness left side []  numbness in []  arms []  legs [x]  hx of CVA []  mini stroke [] difficulty speaking or slurred speech []  temporary loss of vision in one eye []  dizziness  Hematologic: []  hx of cancer []  bleeding problems []  problems with blood clotting easily  Endocrine:   [x]  diabetes []  thyroid disease  GI []  vomiting blood []  blood in stool  GU: [x]  CKD/renal failure [x]  HD--[]  M/W/F or [x]  T/T/S (scheduled in [place]) []  burning with urination []  blood in urine  Psychiatric: []  anxiety []  depression  Musculoskeletal: []  arthritis []  joint pain  Integumentary: []  rashes []  ulcers  Constitutional: []  fever []  chills   Physical Examination  Vitals:   12/13/17 0424 12/13/17 1334  BP: (!) 153/75 (!) 143/65  Pulse: 79 75  Resp: 15 17  Temp: 97.6 F (36.4 C) 98.2 F (36.8 C)  SpO2: 99% 100%   Body mass index is 18.04 kg/m.  General:  WDWN in NAD Gait: Not observed HENT: WNL, normocephalic Pulmonary: normal non-labored breathing, without Rales, rhonchi,  wheezing Cardiac: regular, without  Murmurs, rubs or gallops; without carotid bruits Abdomen:  soft, NT/ND, no masses Skin: without rashes Vascular Exam/Pulses:  Right Left  Radial 1+ (weak) 1+ (weak)  Ulnar Unable to palpate   Unable to palpate   DP 2+ (normal) 2+ (normal)  PT Unable to palpate  Unable to palpate    Extremities: without ischemic changes, without Gangrene , without cellulitis; without open wounds;  Musculoskeletal: no muscle wasting or atrophy  Neurologic: A&O X 3; 4/5 grip left arm; 5/5 grip right arm Psychiatric:  The pt has flat affect.   CBC    Component Value Date/Time   WBC 9.1 12/11/2017 1311   RBC 3.23 (L) 12/11/2017 1311   HGB 9.5 (L) 12/11/2017 1311   HCT 31.5 (L) 12/11/2017 1311   PLT 286 12/11/2017 1311   MCV 97.5 12/11/2017 1311   MCH 29.4 12/11/2017 1311   MCHC 30.2 12/11/2017 1311   RDW 20.9 (H) 12/11/2017 1311   LYMPHSABS 1.9 11/27/2017 0401   MONOABS 1.0 11/27/2017 0401   EOSABS 0.3 11/27/2017 0401   BASOSABS 0.0 11/27/2017 0401    BMET    Component Value Date/Time   NA 130 (L) 12/11/2017 1330   K 3.8 12/11/2017 1330   CL 96 (L) 12/11/2017 1330   CO2 21 (L) 12/11/2017 1330   GLUCOSE 203 (H) 12/11/2017 1330   BUN 70 (H) 12/11/2017 1330   CREATININE 6.02 (H) 12/11/2017 1330   CALCIUM 8.6 (L) 12/11/2017 1330   GFRNONAA 7 (L) 12/11/2017 1330   GFRAA 8 (L) 12/11/2017 1330    COAGS: Lab Results  Component Value Date   INR 1.41 11/22/2017   INR 1.47 11/14/2017   INR 1.32 09/13/2013     Non-Invasive Vascular Imaging:   Vein mapping pending   Statin:  No. Beta Blocker:  Yes.   Aspirin:  No. ACEI:  No. ARB:  No. CCB use:  No Other antiplatelets/anticoagulants:  Yes.   sq heparin   ASSESSMENT/PLAN: This is a 61 y.o. female with acute on chronic renal failure most likely going to require dialysis.     -pt is right hand dominant and has had a stroke  affecting her left side.  Will restrict her left arm and have a more definitive plan once vein mapping is complete.  -Dr. Scot Dock to see pt later today   Leontine Locket, PA-C [facility] [phone]  I have interviewed the patient and examined the patient. I agree with the  findings by the PA. Will schedule left AVF/AVG later in the week.    Gae Gallop, MD [phone]

## 2017-12-13 NOTE — Progress Notes (Signed)
[facility] PHYSICAL MEDICINE & REHABILITATION PROGRESS NOTE   Subjective/Complaints: Pt states son will visit her today  ROS: Patient denies nausea, vomiting, diarrhea, cough, shortness of breath or chest pain, joint or back pain, headache, or mood change.    Objective:   No results found. Recent Labs    12/10/17 1303 12/11/17 1311  WBC 8.5 9.1  HGB 11.1* 9.5*  HCT 38.2 31.5*  PLT 296 286   Recent Labs    12/10/17 1303 12/11/17 1330  NA 132* 130*  K 4.3 3.8  CL 97* 96*  CO2 20* 21*  GLUCOSE 242* 203*  BUN 44* 70*  CREATININE 4.82* 6.02*  CALCIUM 9.2 8.6*    Intake/Output Summary (Last 24 hours) at 12/13/2017 0817 Last data filed at 12/12/2017 2300 Gross per 24 hour  Intake 300 ml  Output -  Net 300 ml     Physical Exam: Vital Signs Blood pressure (!) 153/75, pulse 79, temperature 97.6 F (36.4 C), resp. rate 15, height 5' (1.524 m), weight 41.9 kg, SpO2 99 %. Constitutional: No distress . Vital signs reviewed. HEENT: EOMI, oral membranes moist Neck: supple Cardiovascular: RRR without murmur. No JVD    Respiratory: CTA Bilaterally without wheezes or rales. Normal effort    GI: BS +, non-tender, non-distended  Musculoskeletal:  No edema or tenderness in extremities  Neurological: She is alert.  Speech fairly clear. Follows basic commands.  Motor: motor 4/5 throughout LUE ataxia Skin: Skin is warm and dry.  Psychiatric: cooperative and pleasant     Assessment/Plan: 1. 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  Physiatrist is providing close team supervision and 24 hour management of active medical problems listed below.  Physiatrist and rehab team continue to assess barriers to discharge/monitor patient progress toward functional and medical goals  Care Tool:  Bathing  Bathing activity did not occur:  Refused Body parts bathed by patient: Right arm, Left arm, Chest, Abdomen, Right upper leg, Face, Left upper leg, Front perineal area, Buttocks, Right lower leg, Left lower leg         Bathing assist Assist Level: Supervision/Verbal cueing     Upper Body Dressing/Undressing Upper body dressing Upper body dressing/undressing activity did not occur (including orthotics): Refused What is the patient wearing?: Pull over shirt    Upper body assist Assist Level: Independent    Lower Body Dressing/Undressing Lower body dressing    Lower body dressing activity did not occur: Refused What is the patient wearing?: Pants, Incontinence brief     Lower body assist Assist for lower body dressing: Supervision/Verbal cueing     Toileting Toileting    Toileting assist Assist for toileting: Moderate Assistance - Patient 50 - 74%     Transfers Chair/bed transfer  Transfers assist     Chair/bed transfer assist level: Supervision/Verbal cueing     Locomotion Ambulation   Ambulation assist      Assist level: Supervision/Verbal cueing Assistive device: Walker-rolling Max distance: 100 ft   Walk 10 feet activity   Assist     Assist level: Supervision/Verbal cueing Assistive device: Walker-rolling   Walk 50 feet activity   Assist    Assist level: Supervision/Verbal cueing Assistive device: Walker-rolling    Walk 150 feet activity   Assist Walk 150 feet activity did not occur: Safety/medical concerns  Assist level: Supervision/Verbal cueing Assistive device: Walker-rolling    Walk 10 feet  on uneven surface  activity   Assist     Assist level: Supervision/Verbal cueing Assistive device: Aeronautical engineer Will patient use wheelchair at discharge?: No   Wheelchair activity did not occur: Safety/medical concerns         Wheelchair 50 feet with 2 turns activity    Assist    Wheelchair 50 feet with 2 turns activity did  not occur: Safety/medical concerns       Wheelchair 150 feet activity     Assist Wheelchair 150 feet activity did not occur: Safety/medical concerns        Medical Problem List and Plan: 1.  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    ---Continue CIR therapies including PT, OT, and SLP    Stable for SLF 2.  DVT Prophylaxis/Anticoagulation: Pharmaceutical: Lovenox 3. Pain Management: tylenol prn. Leg /foot tingling with prolonged walking probably represents neurogenic claudication 4. Mood: LCSW to follow for evaluation and support.  5. Neuropsych: This patient is not fully capable of making decisions on her own behalf. 6. Skin/Wound Care: Routine pressure relief measures measures.    7. Fluids/Electrolytes/Nutrition: Strict I's and O's.   - Labs with dialysis. 8.  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   Pharmacy dosing on DPH  -continue topamax 25mg  bid 9.  History of hydronephrosis s/p stents UTI treated 10.  T2DM: Intake improving with improvement in mentation.  Low-dose Lantus resumed  .    -SSI coverage   CBG (last 3)  Recent Labs    12/12/17 1644 12/12/17 2135 12/13/17 0629  GLUCAP 110* 229* 123*  increase lantus to 12 u daily, controlled am CBG 11.  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  Anemia of chronic disease:   Continue Aranesp.   14.  Daily soft BM with incont, not on laxatives, however pt states she is "constipated"   LOS: 14 days A FACE TO [place] E Kirsteins 12/13/2017, 8:17 AM

## 2017-12-13 NOTE — Progress Notes (Signed)
Occupational Therapy Session Note  Patient Details  Name: Erica [name] MRN: [id number] Date of Birth: 1955/05/03  Today's Date: 12/13/2017 OT Individual Time: 0930-1000 OT Individual Time Calculation (min): 30 min    Short Term Goals: Week 2:  OT Short Term Goal 1 (Week 2): STG = LTGs due to remaining LOS  Skilled Therapeutic Interventions/Progress Updates:    Pt seen for OT ADL bathing/dressing session. Pt sitting EOB upon arrival, denied pain and agreeable to tx session.  She ambulated throughout room with guarding assist using RW, occasional cuing for RW management in functional context . She completed toileting task with supervision, standing to complete pericare/buttock hygiene with UE support on RW. Transitioned into shower, bathed LB only due to dialysis port on chest. Standing with use of grab bars and supervision for further hygiene of buttock/periarea. She requiring intermittent cuing for sequencing of tasks due to perseverative tendencies.  She returned to w/c to dress. Increased time and supervision to thread pants into underwear. Socks/shoes donned total A for time management. Pt returned to recliner at end of session, chair belt on, and all needs in reach.   Therapy Documentation Precautions:  Precautions Precautions: Fall Precaution Comments: seizures Restrictions Weight Bearing Restrictions: No Pain:   No/denies pain   Therapy/Group: Individual Therapy  Anwen Cannedy L 12/13/2017, 7:40 AM

## 2017-12-13 NOTE — Progress Notes (Signed)
[facility] Progress Note    Assessment/ Plan:   Pt is a 62 y.o. yo female who was admitted on 11/29/2017 with recurrent A on CRF after pyelo- dialysis initiated 9/27 at [facility]- then was found to have CVA- transferred to [facility]    1. Renal- intermittently HD dep-  Done 10/12 then 10/19.  [place] placed 10/7.  Prior to most recent admission crt was around 4 and now I see that BUN and crt were worsening daily off of HD- I do not think is likely she will be able to do without HD- I think would be best to send out on HD but continuing to look for signs of recovery.   Now looking into SNF ?  Has now been accepted at [facility].   - Will request VVS for a permanent access placement; already very advanced renal failure prior to admission and no signs of recovery. - Next HD tomorrow. 2. HTN/vol-  NO BP meds here or recorded as OP- have started coreg since pulse high ,   - needs UF with HD also for edema I think this will be the key  3. Anemia- hgb in 8's, improving, now 9.5 - is on ESA- last iron stores OK 4. CVA- on rehab/keppra  5. GU- recent pyelo- stents in place - I guess that GU here wants her to get stents removed/changed where she had them put in at [facility]- not sure how practical that might be  6. Bones- PTH was 237- started vit D - phos now 7.5- added phoslo - coming down   Subjective:   Rehab going well.  Denies f/c/n/v/dyspnea.   Objective:   BP (!) 143/65 (BP Location: Left Arm)   Pulse 75   Temp 98.2 F (36.8 C)   Resp 17   Ht 5' (1.524 m)   Wt 41.9 kg   SpO2 100%   BMI 18.04 kg/m   Intake/Output Summary (Last 24 hours) at 12/13/2017 1520 Last data filed at 12/13/2017 0830 Gross per 24 hour  Intake 360 ml  Output -  Net 360 ml   Weight change: -4 kg  Physical Exam: General: NAD- not great historian Heart: RRR Lungs: mostly clear Abdomen: distended, slightly tender Extremities: pitting edema Dialysis Access: TDC on right     Imaging: No results found.  Labs: BMET Recent Labs  Lab 12/07/17 0501 12/08/17 1352 12/10/17 1303 12/11/17 1330  NA 131* 134* 132* 130*  K 3.4* 3.5 4.3 3.8  CL 96* 96* 97* 96*  CO2 22 21* 20* 21*  GLUCOSE 183* 194* 242* 203*  BUN 49* 67* 44* 70*  CREATININE 5.38* 6.35* 4.82* 6.02*  CALCIUM 8.6* 8.9 9.2 8.6*  PHOS 6.6* 8.1* 7.8* 7.5*   CBC Recent Labs  Lab 12/08/17 1353 12/09/17 0653 12/10/17 1303 12/11/17 1311  WBC 8.2 8.7 8.5 9.1  HGB 9.3* 9.7* 11.1* 9.5*  HCT 31.2* 31.8* 38.2 31.5*  MCV 97.2 97.2 98.2 97.5  PLT 350 304 296 286    Medications:    . calcitRIOL  0.25 mcg Oral Q T,Th,Sa-HD  . calcium acetate  1,334 mg Oral TID WC  . carvedilol  12.5 mg Oral BID WC  . Chlorhexidine Gluconate Cloth  6 each Topical Q0600  . darbepoetin (ARANESP) injection - DIALYSIS  150 mcg Subcutaneous Q Thu-1800  . feeding supplement (NEPRO CARB STEADY)  237 mL Oral BID WC  . heparin  5,000 Units Subcutaneous Q8H  . insulin aspart  0-5 Units  Subcutaneous QHS  . insulin aspart  0-9 Units Subcutaneous TID WC  . insulin glargine  12 Units Subcutaneous Daily  . levETIRAcetam  1,000 mg Oral Daily  . levETIRAcetam  500 mg Oral Q T,Th,Sa-HD  . multivitamin  1 tablet Oral QHS  . nicotine  14 mg Transdermal Daily  . phenytoin  150 mg Oral BID  . saccharomyces boulardii  250 mg Oral BID  . topiramate  25 mg Oral BID      Otelia Santee, MD 12/13/2017, 3:20 PM

## 2017-12-14 ENCOUNTER — Inpatient Hospital Stay (HOSPITAL_COMMUNITY): Payer: Medicaid Other | Admitting: Physical Therapy

## 2017-12-14 ENCOUNTER — Inpatient Hospital Stay (HOSPITAL_COMMUNITY): Payer: Medicaid Other | Admitting: Occupational Therapy

## 2017-12-14 ENCOUNTER — Encounter (HOSPITAL_COMMUNITY): Payer: Medicaid Other

## 2017-12-14 ENCOUNTER — Encounter (HOSPITAL_COMMUNITY)
Admission: RE | Disposition: A | Discharge status: Skilled Nursing Facility | Payer: Self-pay | Source: Intra-hospital | Attending: Physical Medicine & Rehabilitation

## 2017-12-14 LAB — CBC
HEMATOCRIT: 33.9 % — AB (ref 36.0–46.0)
Hemoglobin: 10.1 g/dL — ABNORMAL LOW (ref 12.0–15.0)
MCH: 29.1 pg (ref 26.0–34.0)
MCHC: 29.8 g/dL — ABNORMAL LOW (ref 30.0–36.0)
MCV: 97.7 fL (ref 80.0–100.0)
NRBC: 0 % (ref 0.0–0.2)
Platelets: 275 10*3/uL (ref 150–400)
RBC: 3.47 MIL/uL — ABNORMAL LOW (ref 3.87–5.11)
RDW: 19.9 % — ABNORMAL HIGH (ref 11.5–15.5)
WBC: 8.5 10*3/uL (ref 4.0–10.5)

## 2017-12-14 LAB — GLUCOSE, CAPILLARY
Glucose-Capillary: 174 mg/dL — ABNORMAL HIGH (ref 70–99)
Glucose-Capillary: 165 mg/dL — ABNORMAL HIGH (ref 70–99)
Glucose-Capillary: 138 mg/dL — ABNORMAL HIGH (ref 70–99)

## 2017-12-14 LAB — RENAL FUNCTION PANEL
ALBUMIN: 2.5 g/dL — AB (ref 3.5–5.0)
ANION GAP: 16 — AB (ref 5–15)
BUN: 77 mg/dL — ABNORMAL HIGH (ref 8–23)
CALCIUM: 9 mg/dL (ref 8.9–10.3)
CO2: 20 mmol/L — AB (ref 22–32)
Chloride: 96 mmol/L — ABNORMAL LOW (ref 98–111)
Creatinine, Ser: 6.09 mg/dL — ABNORMAL HIGH (ref 0.44–1.00)
GFR calc non Af Amer: 7 mL/min — ABNORMAL LOW (ref >60)
GFR, EST AFRICAN AMERICAN: 8 mL/min — AB (ref >60)
GLUCOSE: 143 mg/dL — AB (ref 70–99)
Phosphorus: 8.1 mg/dL — ABNORMAL HIGH (ref 2.5–4.6)
Potassium: 4 mmol/L (ref 3.5–5.1)
SODIUM: 132 mmol/L — AB (ref 135–145)

## 2017-12-14 LAB — PHOSPHORUS: Phosphorus: 8.3 mg/dL — ABNORMAL HIGH (ref 2.5–4.6)

## 2017-12-14 SURGERY — ARTERIOVENOUS (AV) FISTULA CREATION
Anesthesia: General | Laterality: Left

## 2017-12-14 MED ORDER — HEPARIN SODIUM (PORCINE) 1000 UNIT/ML IJ SOLN
INTRAMUSCULAR | Status: AC
  Administered 2017-12-14: 3200 [IU] via INTRAVENOUS_CENTRAL
  Filled 2017-12-14: qty 4

## 2017-12-14 MED ORDER — SODIUM CHLORIDE 0.9 % IV SOLN
1.5000 g | INTRAVENOUS | Status: AC
  Filled 2017-12-14: qty 1.5

## 2017-12-14 MED ORDER — CALCITRIOL 0.25 MCG PO CAPS
ORAL_CAPSULE | ORAL | Status: AC
  Filled 2017-12-14: qty 1

## 2017-12-14 MED ORDER — LANTHANUM CARBONATE 500 MG PO CHEW
750.0000 mg | CHEWABLE_TABLET | Freq: Three times a day (TID) | ORAL | Status: DC
  Administered 2017-12-14 – 2017-12-15 (×4): 750 mg via ORAL
  Filled 2017-12-14 (×8): qty 2

## 2017-12-14 MED ORDER — CALCITRIOL 0.25 MCG PO CAPS: 0.2500 ug | ORAL_CAPSULE | Freq: Once | ORAL | Status: AC

## 2017-12-14 NOTE — Progress Notes (Signed)
[facility] Progress Note    Assessment/ Plan:   Pt is a50 y.o.yo femalewho was admitted on 10/14/2019with recurrent A on CRF after pyelo- dialysis initiated 9/27 at [facility]- then was found to have CVA- transferred to [facility]    1. Renal-intermittently HD dep- Done 10/12 then 10/19. [place] placed 10/7. Prior to most recent admission crt was around 4 and now I see that BUN and crt were worsening daily off of HD- I do not think is likely she will be able to do without HD- I think would be best to send out on HD but continuing to lookfor signs of recovery. Now looking into SNF ?  Has now been accepted at [facility].  - Appreciate VVS seeing the pt for a permanent access placement; already very advanced renal failure prior to admission and no signs of recovery. - Next HD today; hopefully the compression stockings will help mobilize the fluid so she doesn't feel poorly after HD today.  2. HTN/vol- NO BP meds here or recorded as OP- have started coreg since pulse high , - needs UF with HD also for edema I think this will be the key  3. Anemia- hgb in 8's, improving, now 9.5- is on ESA- last iron stores [place] 4. CVA- on rehab/keppra  5. GU- recent pyelo- stents in place - I guess that GU here wants her to get stents removed/changed where she had them put in at [facility]- not sure how practical that might be  6. Bones-PTH was 237- started vit D - phos cont to rise -> change from phosloto fosrenol.   Subjective:   Rehab going well.  Denies f/c/n/v/dyspnea.  Last HD she did not feel well afterwards; added the compression stockings yest to help mobilize fluid.   Objective:   BP (!) 167/76 (BP Location: Left Arm)   Pulse 80   Temp 98.3 F (36.8 C)   Resp 17   Ht 5' (1.524 m)   Wt 40 kg   SpO2 99%   BMI 17.22 kg/m   Intake/Output Summary (Last 24 hours) at 12/14/2017 0741 Last data filed at 12/13/2017 1801 Gross per 24 hour  Intake 360 ml   Output -  Net 360 ml   Weight change: -1.9 kg  Physical Exam: General: NAD- not great historian but pleasant Heart: RRR Lungs: mostly clear Abdomen: distended, slightly tender Extremities: pitting edema Dialysis Access: TDC on right  Imaging: No results found.  Labs: BMET Recent Labs  Lab 12/08/17 1352 12/10/17 1303 12/11/17 1330 12/14/17 0515  NA 134* 132* 130*  --   K 3.5 4.3 3.8  --   CL 96* 97* 96*  --   CO2 21* 20* 21*  --   GLUCOSE 194* 242* 203*  --   BUN 67* 44* 70*  --   CREATININE 6.35* 4.82* 6.02*  --   CALCIUM 8.9 9.2 8.6*  --   PHOS 8.1* 7.8* 7.5* 8.3*   CBC Recent Labs  Lab 12/08/17 1353 12/09/17 0653 12/10/17 1303 12/11/17 1311  WBC 8.2 8.7 8.5 9.1  HGB 9.3* 9.7* 11.1* 9.5*  HCT 31.2* 31.8* 38.2 31.5*  MCV 97.2 97.2 98.2 97.5  PLT 350 304 296 286    Medications:    . calcitRIOL  0.25 mcg Oral Q T,Th,Sa-HD  . calcium acetate  1,334 mg Oral TID WC  . carvedilol  12.5 mg Oral BID WC  . Chlorhexidine Gluconate Cloth  6 each Topical Q0600  . darbepoetin (  ARANESP) injection - DIALYSIS  150 mcg Subcutaneous Q Thu-1800  . feeding supplement (NEPRO CARB STEADY)  237 mL Oral BID WC  . heparin  5,000 Units Subcutaneous Q8H  . insulin aspart  0-5 Units Subcutaneous QHS  . insulin aspart  0-9 Units Subcutaneous TID WC  . insulin glargine  12 Units Subcutaneous Daily  . levETIRAcetam  1,000 mg Oral Daily  . levETIRAcetam  500 mg Oral Q T,Th,Sa-HD  . multivitamin  1 tablet Oral QHS  . nicotine  14 mg Transdermal Daily  . phenytoin  150 mg Oral BID  . saccharomyces boulardii  250 mg Oral BID  . topiramate  25 mg Oral BID      Otelia Santee, MD 12/14/2017, 7:41 AM

## 2017-12-14 NOTE — Progress Notes (Signed)
[facility] PHYSICAL MEDICINE & REHABILITATION PROGRESS NOTE   Subjective/Complaints: Appreciate VVS note  ROS: Patient denies nausea, vomiting, diarrhea, cough, shortness of breath or chest pain, joint or back pain, headache, or mood change.    Objective:   No results found. Recent Labs    12/11/17 1311  WBC 9.1  HGB 9.5*  HCT 31.5*  PLT 286   Recent Labs    12/11/17 1330  NA 130*  K 3.8  CL 96*  CO2 21*  GLUCOSE 203*  BUN 70*  CREATININE 6.02*  CALCIUM 8.6*    Intake/Output Summary (Last 24 hours) at 12/14/2017 0759 Last data filed at 12/13/2017 1801 Gross per 24 hour  Intake 360 ml  Output -  Net 360 ml     Physical Exam: Vital Signs Blood pressure (!) 167/76, pulse 80, temperature 98.3 F (36.8 C), resp. rate 17, height 5' (1.524 m), weight 40 kg, SpO2 99 %. Constitutional: No distress . Vital signs reviewed. HEENT: EOMI, oral membranes moist Neck: supple Cardiovascular: RRR without murmur. No JVD    Respiratory: CTA Bilaterally without wheezes or rales. Normal effort    GI: BS +, non-tender, non-distended  Musculoskeletal:  No edema or tenderness in extremities  Neurological: She is alert.  Speech fairly clear. Follows basic commands.  Motor: motor 4/5 throughout LUE ataxia Skin: Skin is warm and dry.  Psychiatric: cooperative and pleasant     Assessment/Plan: 1. 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  Physiatrist is providing close team supervision and 24 hour management of active medical problems listed below.  Physiatrist and rehab team continue to assess barriers to discharge/monitor patient progress toward functional and medical goals  Care Tool:  Bathing  Bathing activity did not occur: Refused Body parts bathed by patient: Buttocks, Front perineal area, Right upper leg, Left upper leg,  Right lower leg, Left lower leg         Bathing assist Assist Level: Supervision/Verbal cueing(Completed LB bathing only)     Upper Body Dressing/Undressing Upper body dressing Upper body dressing/undressing activity did not occur (including orthotics): Refused What is the patient wearing?: Pull over shirt    Upper body assist Assist Level: Set up assist    Lower Body Dressing/Undressing Lower body dressing    Lower body dressing activity did not occur: Refused What is the patient wearing?: Hospital gown only, Incontinence brief     Lower body assist Assist for lower body dressing: Supervision/Verbal cueing     Toileting Toileting    Toileting assist Assist for toileting: Supervision/Verbal cueing     Transfers Chair/bed transfer  Transfers assist     Chair/bed transfer assist level: Supervision/Verbal cueing     Locomotion Ambulation   Ambulation assist      Assist level: Supervision/Verbal cueing Assistive device: Walker-rolling Max distance: 150 ft   Walk 10 feet activity   Assist     Assist level: Supervision/Verbal cueing Assistive device: Walker-rolling   Walk 50 feet activity   Assist    Assist level: Supervision/Verbal cueing Assistive device: Walker-rolling    Walk 150 feet activity   Assist Walk 150 feet activity did not occur: Safety/medical concerns  Assist level: Supervision/Verbal cueing Assistive device: Walker-rolling    Walk 10 feet on uneven surface  activity   Assist     Assist level: Supervision/Verbal cueing Assistive device: Chemical engineer  Assist Will patient use wheelchair at discharge?: No   Wheelchair activity did not occur: Safety/medical concerns         Wheelchair 50 feet with 2 turns activity    Assist    Wheelchair 50 feet with 2 turns activity did not occur: Safety/medical concerns       Wheelchair 150 feet activity     Assist Wheelchair 150 feet  activity did not occur: Safety/medical concerns        Medical Problem List and Plan: 1.  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    ---Continue CIR therapies including PT, OT, and SLP    Stable for SNF, team conf in am 2.  DVT Prophylaxis/Anticoagulation: Pharmaceutical: Lovenox 3. Pain Management: tylenol prn. Leg /foot tingling with prolonged walking probably represents neurogenic claudication 4. Mood: LCSW to follow for evaluation and support.  5. Neuropsych: This patient is not fully capable of making decisions on her own behalf. 6. Skin/Wound Care: Routine pressure relief measures measures.    7. Fluids/Electrolytes/Nutrition: Strict I's and O's.   - Labs with dialysis. 8.  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   Pharmacy dosing on DPH  -continue topamax 25mg  bid 9.  History of hydronephrosis s/p stents UTI treated 10.  T2DM: Intake improving with improvement in mentation.  Low-dose Lantus resumed  .    -SSI coverage   CBG (last 3)  Recent Labs    12/13/17 1641 12/13/17 2126 12/14/17 0641  GLUCAP 161* 247* 174*  increase lantus to 12 u daily, controlled am CBG 11.  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  Anemia of chronic disease:   Continue Aranesp Last Hgb 9.6.   14.  Daily soft BM with incont, not on laxatives, however pt states she is "constipated"   LOS: 15 days A FACE TO [place] Erica [name] 12/14/2017, 7:59 AM

## 2017-12-14 NOTE — Progress Notes (Addendum)
Physical Therapy Session Note  Patient Details  Name: Erica [name] MRN: [id number] Date of Birth: 01/10/1956  Today's Date: 12/14/2017 PT Individual Time: 5364-6803 PT Individual Time Calculation (min): 24 min   Short Term Goals: Week 2:  PT Short Term Goal 1 (Week 2): STG = LTG due to anticipated d/c.   Skilled Therapeutic Interventions/Progress Updates:  Pt received in bed & agreeable to tx, denying c/o pain. Pt transferred to EOB with HOB elevated & bed rails with mod I. Pt donned tennis shoes with set up assist and extra time. Pt requires occasional cuing to push up on stable surface for sit>stand. Pt ambulates room<>dayroom with RW and slightly increased gait speed with occasional encouragement from PT. Pt utilized cybex kinetron in sitting then standing with BUE support and tactile/verbal cuing for posture with task focusing on BLE strengthening, weight shifting, & L NMR (pt reports LLE fatigues more quickly than RLE). Pt able to progress to no BUE support with min assist for balance. At end of session pt returned to room & requires cuing to reach back for seat. Pt left sitting in recliner with chair alarm donned & call bell in reach, BLE elevated.   Therapy Documentation Precautions:  Precautions Precautions: Fall Precaution Comments: seizures Restrictions Weight Bearing Restrictions: No    Therapy/Group: Individual Therapy  [name] 12/14/2017, 11:33 AM

## 2017-12-14 NOTE — Progress Notes (Signed)
Pam-PA reports pt can transfer to [facility] tomorrow after procedure. Have contacted son-calvin, [facility] in [place] and awaiting return call from [place] to confirm transfer tomorrow. Plan for transfer tomorrow as long as confirmed with NH.

## 2017-12-14 NOTE — Anesthesia Preprocedure Evaluation (Addendum)
Anesthesia Evaluation  Patient identified by MRN, date of birth, ID band Patient awake    Reviewed: Allergy & Precautions, NPO status , Patient's Chart, lab work & pertinent test results  History of Anesthesia Complications Negative for: history of anesthetic complications  Airway Mallampati: I  TM Distance: >3 FB Neck ROM: Full    Dental  (+) Edentulous Upper, Edentulous Lower, Dental Advisory Given   Pulmonary neg pulmonary ROS,    Pulmonary exam normal        Cardiovascular hypertension, Pt. on home beta blockers + Past MI  Normal cardiovascular exam   '19 TTE - Moderate focal basal septal hypertrophy. Mild hypertrophy of the rest of the LV. EF 60% to 65%. Grade 1 diastolic dysfunction. Trivial AI. There was chordal systolic anterior motion but not valvular SAM. Trivial MR. PA peak pressure: 50 mm Hg  '19 Carotid US - 1-39% b/l ICAS    Neuro/Psych Seizures -,  PSYCHIATRIC DISORDERS Depression CVA    GI/Hepatic GERD  ,(+)     substance abuse  alcohol use,  Gastroparesis    Endo/Other  diabetes, Insulin Dependent Hyponatremia Hypochloremia   Renal/GU ESRF and DialysisRenal disease     Musculoskeletal negative musculoskeletal ROS (+)   Abdominal   Peds  Hematology  (+) anemia ,   Anesthesia Other Findings   Reproductive/Obstetrics                            Anesthesia Physical  Anesthesia Plan  ASA: IV  Anesthesia Plan: MAC   Post-op Pain Management:    Induction:   PONV Risk Score and Plan: 3 and Treatment may vary due to age or medical condition, Ondansetron and Dexamethasone  Airway Management Planned: Natural Airway and Simple Face Mask  Additional Equipment: None  Intra-op Plan:   Post-operative Plan:   Informed Consent: I have reviewed the patients History and Physical, chart, labs and discussed the procedure including the risks, benefits and alternatives  for the proposed anesthesia with the patient or authorized representative who has indicated his/her understanding and acceptance.   Dental advisory given  Plan Discussed with: Anesthesiologist and CRNA  Anesthesia Plan Comments:        Anesthesia Quick Evaluation

## 2017-12-14 NOTE — Progress Notes (Signed)
VASCULAR SURGERY:  The patient is scheduled for new access in the left arm tomorrow.  Her vein map is still pending.  I have discussed the procedure and potential complications with her and she is agreeable to proceed.  She could be discharged to the skilled nursing facility tomorrow after her procedure.  Deitra Mayo, MD, [place] [phone] Office: [phone]

## 2017-12-14 NOTE — Progress Notes (Signed)
Social Work Patient ID: Erica [name], female   DOB: March 29, 1955, 63 y.o.   MRN: [id number] Spoke with [facility] who can offer pt a bed in their facility when she is medically ready. Spoke with Calvin-son and pt and both are in agreement with this plan. Awaiting medical clearance for tentative transfer date. Was suppose to get permanent graft today but was cancelled.

## 2017-12-14 NOTE — Progress Notes (Signed)
Occupational Therapy Session Note  Patient Details  Name: Erica [name] MRN: [id number] Date of Birth: 09/12/1955  Today's Date: 12/14/2017 OT Individual Time: 1005-1035 OT Individual Time Calculation (min): 30 min    Short Term Goals: Week 2:  OT Short Term Goal 1 (Week 2): STG = LTGs due to remaining LOS  Skilled Therapeutic Interventions/Progress Updates:    Pt seen for ADL training for bathing at sink level and dressing. Overall, pt completed all tasks with S.  See documentation below. Pt resting in bed with bed alarm set.   Therapy Documentation Precautions:  Precautions Precautions: Fall Precaution Comments: seizures Restrictions Weight Bearing Restrictions: No      Pain: Pain Assessment Pain Scale: 0-10 Pain Score: 0-No pain ADL: ADL Eating: Set up Grooming: Setup Where Assessed-Grooming: Standing at sink Upper Body Bathing: Setup Where Assessed-Upper Body Bathing: Sitting at sink Lower Body Bathing: Supervision/safety Where Assessed-Lower Body Bathing: Sitting at sink, Standing at sink Upper Body Dressing: Setup Where Assessed-Upper Body Dressing: Sitting at sink Lower Body Dressing: Supervision/safety Where Assessed-Lower Body Dressing: Edge of bed   Therapy/Group: Individual Therapy  [place] 12/14/2017, 1:08 PM

## 2017-12-14 NOTE — H&P (View-Only) (Signed)
VASCULAR SURGERY:  The patient is scheduled for new access in the left arm tomorrow.  Her vein map is still pending.  I have discussed the procedure and potential complications with her and she is agreeable to proceed.  She could be discharged to the skilled nursing facility tomorrow after her procedure.  Deitra Mayo, MD, [place] [phone] Office: [phone]

## 2017-12-15 ENCOUNTER — Inpatient Hospital Stay (HOSPITAL_COMMUNITY): Payer: Medicaid Other | Admitting: Physical Therapy

## 2017-12-15 ENCOUNTER — Inpatient Hospital Stay (HOSPITAL_COMMUNITY): Payer: Medicaid Other | Admitting: Occupational Therapy

## 2017-12-15 ENCOUNTER — Inpatient Hospital Stay (HOSPITAL_COMMUNITY): Payer: Medicaid Other | Admitting: Anesthesiology

## 2017-12-15 ENCOUNTER — Encounter (HOSPITAL_COMMUNITY): Payer: Self-pay | Admitting: Anesthesiology

## 2017-12-15 ENCOUNTER — Encounter (HOSPITAL_COMMUNITY)
Admission: RE | Disposition: A | Discharge status: Skilled Nursing Facility | Payer: Self-pay | Source: Intra-hospital | Attending: Physical Medicine & Rehabilitation

## 2017-12-15 HISTORY — PX: AV FISTULA PLACEMENT: SHX1204

## 2017-12-15 LAB — BASIC METABOLIC PANEL
Anion gap: 12 (ref 5–15)
BUN: 27 mg/dL — ABNORMAL HIGH (ref 8–23)
CO2: 21 mmol/L — ABNORMAL LOW (ref 22–32)
Calcium: 9 mg/dL (ref 8.9–10.3)
Chloride: 101 mmol/L (ref 98–111)
Creatinine, Ser: 3.04 mg/dL — ABNORMAL HIGH (ref 0.44–1.00)
GFR, EST AFRICAN AMERICAN: 18 mL/min — AB (ref >60)
GFR, EST NON AFRICAN AMERICAN: 15 mL/min — AB (ref >60)
Glucose, Bld: 59 mg/dL — ABNORMAL LOW (ref 70–99)
POTASSIUM: 3.7 mmol/L (ref 3.5–5.1)
SODIUM: 134 mmol/L — AB (ref 135–145)

## 2017-12-15 LAB — PROTIME-INR
INR: 1.33
Prothrombin Time: 16.3 seconds — ABNORMAL HIGH (ref 11.4–15.2)

## 2017-12-15 LAB — CBC
HCT: 36.8 % (ref 36.0–46.0)
HEMOGLOBIN: 11 g/dL — AB (ref 12.0–15.0)
MCH: 29.4 pg (ref 26.0–34.0)
MCHC: 29.9 g/dL — AB (ref 30.0–36.0)
MCV: 98.4 fL (ref 80.0–100.0)
Platelets: 259 10*3/uL (ref 150–400)
RBC: 3.74 MIL/uL — AB (ref 3.87–5.11)
RDW: 20.1 % — ABNORMAL HIGH (ref 11.5–15.5)
WBC: 9.5 10*3/uL (ref 4.0–10.5)
nRBC: 0 % (ref 0.0–0.2)

## 2017-12-15 LAB — GLUCOSE, CAPILLARY
GLUCOSE-CAPILLARY: 45 mg/dL — AB (ref 70–99)
Glucose-Capillary: 122 mg/dL — ABNORMAL HIGH (ref 70–99)
Glucose-Capillary: 98 mg/dL (ref 70–99)
Glucose-Capillary: 273 mg/dL — ABNORMAL HIGH (ref 70–99)

## 2017-12-15 LAB — HEPATITIS B SURFACE ANTIBODY,QUALITATIVE: Hep B S Ab: NONREACTIVE

## 2017-12-15 LAB — HEPATITIS B SURFACE ANTIGEN: Hepatitis B Surface Ag: NEGATIVE

## 2017-12-15 SURGERY — INSERTION OF ARTERIOVENOUS (AV) GORE-TEX GRAFT ARM
Anesthesia: Monitor Anesthesia Care | Site: Arm Upper | Laterality: Left

## 2017-12-15 MED ORDER — ONDANSETRON HCL 4 MG/2ML IJ SOLN
INTRAMUSCULAR | Status: AC
  Filled 2017-12-15: qty 2

## 2017-12-15 MED ORDER — DARBEPOETIN ALFA 150 MCG/0.3ML IJ SOSY: 150.0000 ug | PREFILLED_SYRINGE | INTRAMUSCULAR | Status: DC

## 2017-12-15 MED ORDER — FENTANYL CITRATE (PF) 100 MCG/2ML IJ SOLN: 25.0000 ug | INTRAMUSCULAR | Status: DC | PRN

## 2017-12-15 MED ORDER — SODIUM CHLORIDE 0.9 % IV SOLN
INTRAVENOUS | Status: DC | PRN
  Administered 2017-12-15: 07:00:00 via INTRAVENOUS

## 2017-12-15 MED ORDER — CARVEDILOL 12.5 MG PO TABS
ORAL_TABLET | ORAL | Status: AC
  Filled 2017-12-15: qty 1

## 2017-12-15 MED ORDER — HEPARIN SODIUM (PORCINE) 1000 UNIT/ML IJ SOLN
INTRAMUSCULAR | Status: AC
  Filled 2017-12-15: qty 1

## 2017-12-15 MED ORDER — CEFAZOLIN SODIUM-DEXTROSE 2-4 GM/100ML-% IV SOLN
INTRAVENOUS | Status: AC
  Filled 2017-12-15: qty 100

## 2017-12-15 MED ORDER — DEXTROSE 50 % IV SOLN
25.0000 mL | Freq: Once | INTRAVENOUS | Status: AC
  Administered 2017-12-15: 25 mL via INTRAVENOUS

## 2017-12-15 MED ORDER — HYDROCODONE-ACETAMINOPHEN 5-325 MG PO TABS: 1.0000 | ORAL_TABLET | Freq: Four times a day (QID) | ORAL | 0 refills | Status: DC | PRN

## 2017-12-15 MED ORDER — LIDOCAINE-EPINEPHRINE 0.5 %-1:200000 IJ SOLN
INTRAMUSCULAR | Status: DC | PRN
  Administered 2017-12-15: 16 mL

## 2017-12-15 MED ORDER — FENTANYL CITRATE (PF) 250 MCG/5ML IJ SOLN
INTRAMUSCULAR | Status: AC
  Filled 2017-12-15: qty 5

## 2017-12-15 MED ORDER — SODIUM CHLORIDE 0.9 % IV SOLN
INTRAVENOUS | Status: AC
  Filled 2017-12-15: qty 1.2

## 2017-12-15 MED ORDER — MIDAZOLAM HCL 2 MG/2ML IJ SOLN
INTRAMUSCULAR | Status: AC
  Filled 2017-12-15: qty 2

## 2017-12-15 MED ORDER — DEXTROSE 50 % IV SOLN
INTRAVENOUS | Status: AC
  Filled 2017-12-15: qty 50

## 2017-12-15 MED ORDER — CEFAZOLIN SODIUM-DEXTROSE 2-3 GM-%(50ML) IV SOLR
INTRAVENOUS | Status: DC | PRN
  Administered 2017-12-15: 2 g via INTRAVENOUS

## 2017-12-15 MED ORDER — PROPOFOL 10 MG/ML IV BOLUS
INTRAVENOUS | Status: AC
  Filled 2017-12-15: qty 20

## 2017-12-15 MED ORDER — DEXAMETHASONE SODIUM PHOSPHATE 10 MG/ML IJ SOLN
INTRAMUSCULAR | Status: AC
  Filled 2017-12-15: qty 1

## 2017-12-15 MED ORDER — SODIUM CHLORIDE 0.9 % IV SOLN
INTRAVENOUS | Status: DC | PRN
  Administered 2017-12-15: 07:00:00

## 2017-12-15 MED ORDER — FENTANYL CITRATE (PF) 100 MCG/2ML IJ SOLN
INTRAMUSCULAR | Status: DC | PRN
  Administered 2017-12-15 (×3): 25 ug via INTRAVENOUS

## 2017-12-15 MED ORDER — SODIUM CHLORIDE 0.9 % IV SOLN
INTRAVENOUS | Status: DC | PRN
  Administered 2017-12-15: 25 ug/min via INTRAVENOUS

## 2017-12-15 MED ORDER — PROPOFOL 500 MG/50ML IV EMUL
INTRAVENOUS | Status: DC | PRN
  Administered 2017-12-15: 75 ug/kg/min via INTRAVENOUS

## 2017-12-15 MED ORDER — PROPOFOL 10 MG/ML IV BOLUS
INTRAVENOUS | Status: DC | PRN
  Administered 2017-12-15 (×2): 10 mg via INTRAVENOUS

## 2017-12-15 MED ORDER — 0.9 % SODIUM CHLORIDE (POUR BTL) OPTIME
TOPICAL | Status: DC | PRN
  Administered 2017-12-15: 1000 mL

## 2017-12-15 MED ORDER — LIDOCAINE-EPINEPHRINE 0.5 %-1:200000 IJ SOLN
INTRAMUSCULAR | Status: AC
  Filled 2017-12-15: qty 1

## 2017-12-15 MED ORDER — PROMETHAZINE HCL 25 MG/ML IJ SOLN: 6.2500 mg | INTRAMUSCULAR | Status: DC | PRN

## 2017-12-15 MED ORDER — MIDAZOLAM HCL 5 MG/5ML IJ SOLN
INTRAMUSCULAR | Status: DC | PRN
  Administered 2017-12-15: 2 mg via INTRAVENOUS

## 2017-12-15 SURGICAL SUPPLY — 39 items
ADH SKN CLS APL DERMABOND .7 (GAUZE/BANDAGES/DRESSINGS) ×2
ARMBAND PINK RESTRICT EXTREMIT (MISCELLANEOUS) ×6 IMPLANT
CANISTER SUCT 3000ML PPV (MISCELLANEOUS) ×3 IMPLANT
CANNULA VESSEL 3MM 2 BLNT TIP (CANNULA) ×3 IMPLANT
CLIP LIGATING EXTRA MED SLVR (CLIP) ×3 IMPLANT
CLIP LIGATING EXTRA SM BLUE (MISCELLANEOUS) ×3 IMPLANT
COVER PROBE W GEL 5X96 (DRAPES) ×3 IMPLANT
COVER WAND RF STERILE (DRAPES) ×3 IMPLANT
DECANTER SPIKE VIAL GLASS SM (MISCELLANEOUS) ×3 IMPLANT
DERMABOND ADVANCED (GAUZE/BANDAGES/DRESSINGS) ×1
DERMABOND ADVANCED .7 DNX12 (GAUZE/BANDAGES/DRESSINGS) ×2 IMPLANT
ELECT REM PT RETURN 9FT ADLT (ELECTROSURGICAL) ×3
ELECTRODE REM PT RTRN 9FT ADLT (ELECTROSURGICAL) ×2 IMPLANT
GLOVE BIO SURGEON STRL SZ 6 (GLOVE) ×2 IMPLANT
GLOVE BIO SURGEON STRL SZ 6.5 (GLOVE) ×2 IMPLANT
GLOVE BIO SURGEON STRL SZ7.5 (GLOVE) ×2 IMPLANT
GLOVE BIOGEL PI IND STRL 6.5 (GLOVE) ×1 IMPLANT
GLOVE BIOGEL PI IND STRL 7.0 (GLOVE) ×1 IMPLANT
GLOVE BIOGEL PI IND STRL 7.5 (GLOVE) ×1 IMPLANT
GLOVE BIOGEL PI INDICATOR 6.5 (GLOVE) ×1
GLOVE BIOGEL PI INDICATOR 7.0 (GLOVE) ×1
GLOVE BIOGEL PI INDICATOR 7.5 (GLOVE) ×1
GLOVE SS BIOGEL STRL SZ 7.5 (GLOVE) ×2 IMPLANT
GLOVE SUPERSENSE BIOGEL SZ 7.5 (GLOVE) ×1
GOWN STRL REUS W/ TWL LRG LVL3 (GOWN DISPOSABLE) ×6 IMPLANT
GOWN STRL REUS W/TWL LRG LVL3 (GOWN DISPOSABLE) ×9
GRAFT GORETEX STRT 4-7X45 (Vascular Products) ×2 IMPLANT
KIT BASIN OR (CUSTOM PROCEDURE TRAY) ×3 IMPLANT
KIT TURNOVER KIT B (KITS) ×3 IMPLANT
NS IRRIG 1000ML POUR BTL (IV SOLUTION) ×3 IMPLANT
PACK CV ACCESS (CUSTOM PROCEDURE TRAY) ×3 IMPLANT
PAD ARMBOARD 7.5X6 YLW CONV (MISCELLANEOUS) ×6 IMPLANT
SUT PROLENE 6 0 CC (SUTURE) ×3 IMPLANT
SUT SILK 2 0 SH (SUTURE) ×2 IMPLANT
SUT VIC AB 3-0 SH 27 (SUTURE) ×3
SUT VIC AB 3-0 SH 27X BRD (SUTURE) ×2 IMPLANT
TOWEL GREEN STERILE (TOWEL DISPOSABLE) ×3 IMPLANT
UNDERPAD 30X30 (UNDERPADS AND DIAPERS) ×3 IMPLANT
WATER STERILE IRR 1000ML POUR (IV SOLUTION) ×3 IMPLANT

## 2017-12-15 NOTE — Progress Notes (Addendum)
Spoke with [facility] who reports they can take her tomorrow after her HD here. PA/MD and team aware will work on transfer for tomorrow. Son contacted made aware also along with [facility] in [place]. Spoke with [facility] to place pt on first run for HD tomorrow.

## 2017-12-15 NOTE — Patient Care Conference (Signed)
Inpatient RehabilitationTeam Conference and Plan of Care Update Date: 12/15/2017   Time: 10:45 Am    Patient Name: Erica [name]      Medical Record Number: [id number]  Date of Birth: 01/06/1956 Sex: Female         Room/Bed: 4W19C/4W19C-01 Payor Info: Payor: MEDICAID [place] / Plan: MEDICAID [place] ACCESS / Product Type: *No Product type* /    Admitting Diagnosis: CVA  Admit Date/Time:  11/29/2017  4:31 PM Admission Comments: No comment available   Primary Diagnosis:  Stroke [facility]) Principal Problem: Stroke [facility])  Patient Active Problem List   Diagnosis Date Noted  . History of hydronephrosis --stents in place 12/08/2017  . Stroke ([place]) 11/29/2017  . Seizures ([place])   . Hydronephrosis   . Recurrent UTI   . Diabetes mellitus type 2 in nonobese (HCC)   . ESRD (end stage renal disease) ([place])   . Anemia of chronic disease   . Chronic diastolic congestive heart failure ([place])   . Essential hypertension   . AKI (acute kidney injury) ([place])   . PICC (peripherally inserted central catheter) in place   . Acute pyelonephritis 11/12/2017  . Acute renal failure superimposed on chronic kidney disease ([place]) 11/10/2017  . Uncontrolled type 2 diabetes mellitus with hyperglycemia, with long-term current use of insulin ([place]) 11/10/2017  . Renal failure 11/08/2017  . Seizure disorder ([place]) 11/08/2017  . Orthostatic hypotension 07/25/2014  . Moderate protein malnutrition ([place]) 09/15/2013  . Aspiration pneumonia ([place]) 09/15/2013  . Acute respiratory failure requiring reintubation ([place]) 09/11/2013  . probable Seizures due to metabolic disorder 41/96/2229  . Lactic acidosis 03/19/2013  . Abdominal pain 03/19/2013  . Rotavirus infection 10/29/2012  . Type II or unspecified type diabetes mellitus without mention of complication, uncontrolled 10/29/2012  . Protein-calorie malnutrition, severe ([place]) 10/27/2012  . NSTEMI (non-ST elevated myocardial infarction) ([place]) 10/26/2012  . Fever, unspecified  10/26/2012  . Hypotension 10/25/2012  . Alcohol withdrawal ([place]) 10/25/2012  . Metabolic acidosis 79/89/2119  . Chronic diarrhea 10/25/2012  . Tobacco abuse 10/25/2012  . DKA (diabetic ketoacidoses) ([place]) 09/09/2012  . Dehydration 09/09/2012  . DKA, type 2 ([place]) 05/20/2012  . Abnormal LFTs 05/20/2012  . Heart murmur, systolic 41/74/0814  . Diabetes mellitus ([place]) 07/08/2011  . Hypoglycemia 07/08/2011  . Metabolic encephalopathy 48/18/5631  . Alcohol intoxication ([place]) 07/08/2011  . Alcohol abuse 07/08/2011  . Hypokalemia 07/08/2011  . Nausea & vomiting 07/08/2011  . Abdominal pain 07/08/2011  . H/O chronic pancreatitis 07/08/2011    Expected Discharge Date: Expected Discharge Date: 12/16/17  Team Members Present: Physician leading conference: Dr. Alysia Penna Social Worker Present: Ovidio Kin, LCSW Nurse Present: Rayetta Pigg, RN PT Present: Lavone Nian, PT OT Present: Simonne Come, OT SLP Present: Stormy Fabian, SLP PPS Coordinator present : Daiva Nakayama, RN, CRRN     Current Status/Progress Goal Weekly Team Focus  Medical   permanent access for HD placed  Pain , medical stability  D/C planning   Bowel/Bladder   continent of B/B LBM 10/28  remain continent of B/B  assist to BR q2h and prn laxative prn   Swallow/Nutrition/ Hydration             ADL's   Supervision with BADLs. Impaired memory, decreased endurance  Supervision goals  dynamic standing balance, cognition, endurance,d/c planning   Mobility   supervision with RW, decreased endurance & strength  supervision overall with LRAD  balance, strengthening, gait, transfers, safety awareness, d/c planning, education   Communication  Safety/Cognition/ Behavioral Observations            Pain   denies any pain  pain free   assess qshift and prn medicate prn   Skin   Right groin healed MASD to perineum- barrier cream  no new areas of breakdown improvement of current deficit  assess q shift  and prn      *See Care Plan and progress notes for long and short-term goals.     Barriers to Discharge  Current Status/Progress Possible Resolutions Date Resolved   Physician    Medical stability     Had AV graft placed today  Planned SNF placement in am with OP HD      Nursing                  PT  Decreased caregiver support;Lack of/limited family support;Behavior;Hemodialysis  son works - not sure if pt has recommended 24 hr supervision at d/c, impaired cognition              OT                  SLP                SW                Discharge Planning/Teaching Needs:    Going to NH tomorrow to receive more rehab son and pt in agreement. Accepted at [facility] in [place] for HD     Team Discussion:  Pt at goal level and ready for transfer to [facility] tomorrow. Got her HD graft today. HD slot T, TH and Sat. Medically stable for discharge tomorrow.   Revisions to Treatment Plan:  DC 10/31    Continued Need for Acute Rehabilitation Level of Care: The patient requires daily medical management by a physician with specialized training in physical medicine and rehabilitation for the following conditions: Daily direction of a multidisciplinary physical rehabilitation program to ensure safe treatment while eliciting the highest outcome that is of practical value to the patient.: Yes Daily medical management of patient stability for increased activity during participation in an intensive rehabilitation regime.: Yes Daily analysis of laboratory values and/or radiology reports with any subsequent need for medication adjustment of medical intervention for : Neurological problems   I attest that I was present, lead the team conference, and concur with the assessment and plan of the team.   Elease Hashimoto 12/16/2017, 8:37 AM

## 2017-12-15 NOTE — Plan of Care (Signed)
  Problem: RH SAFETY Goal: RH STG ADHERE TO SAFETY PRECAUTIONS W/ASSISTANCE/DEVICE Description STG Adhere to Safety Precautions With mod I Assistance/Device.  Outcome: Progressing  Call light within reach, bed/chair alarm, protective footwear

## 2017-12-15 NOTE — Discharge Instructions (Signed)
° °  [facility] ° °Discharge Instructions ° °AV Fistula or Graft Surgery for Dialysis Access ° °Please refer to the following instructions for your post-procedure care. Your surgeon or physician assistant will discuss any changes with you. ° °Activity ° °You may drive the day following your surgery, if you are comfortable and no longer taking prescription pain medication. Resume full activity as the soreness in your incision resolves. ° °Bathing/Showering ° °You may shower after you go home. Keep your incision dry for 48 hours. Do not soak in a bathtub, hot tub, or swim until the incision heals completely. You may not shower if you have a hemodialysis catheter. ° °Incision Care ° °Clean your incision with mild soap and water after 48 hours. Pat the area dry with a clean towel. You do not need a bandage unless otherwise instructed. Do not apply any ointments or creams to your incision. You may have skin glue on your incision. Do not peel it off. It will come off on its own in about one week. Your arm may swell a bit after surgery. To reduce swelling use pillows to elevate your arm so it is above your heart. Your doctor will tell you if you need to lightly wrap your arm with an ACE bandage. ° °Diet ° °Resume your normal diet. There are not special food restrictions following this procedure. In order to heal from your surgery, it is CRITICAL to get adequate nutrition. Your body requires vitamins, minerals, and protein. Vegetables are the best source of vitamins and minerals. Vegetables also provide the perfect balance of protein. Processed food has little nutritional value, so try to avoid this. ° °Medications ° °Resume taking all of your medications. If your incision is causing pain, you may take over-the counter pain relievers such as acetaminophen (Tylenol). If you were prescribed a stronger pain medication, please be aware these medications can cause nausea and constipation. Prevent  nausea by taking the medication with a snack or meal. Avoid constipation by drinking plenty of fluids and eating foods with high amount of fiber, such as fruits, vegetables, and grains. Do not take Tylenol if you are taking prescription pain medications. ° ° ° ° °Follow up °Your surgeon may want to see you in the office following your access surgery. If so, this will be arranged at the time of your surgery. ° °Please call us immediately for any of the following conditions: ° °Increased pain, redness, drainage (pus) from your incision site °Fever of 101 degrees or higher °Severe or worsening pain at your incision site °Hand pain or numbness. ° °Reduce your risk of vascular disease: ° °Stop smoking. If you would like help, call [facility] at 1-800-QUIT-NOW (1-800-784-8669) or [place] at 336-586-4000 ° °Manage your cholesterol °Maintain a desired weight °Control your diabetes °Keep your blood pressure down ° °Dialysis ° °It will take several weeks to several months for your new dialysis access to be ready for use. Your surgeon will determine when it is OK to use it. Your nephrologist will continue to direct your dialysis. You can continue to use your Permcath until your new access is ready for use. ° °If you have any questions, please call the office at [phone]. ° °

## 2017-12-15 NOTE — Progress Notes (Signed)
Report called to OR  

## 2017-12-15 NOTE — Progress Notes (Signed)
[facility] Progress Note    Assessment/ Plan:   Pt is a74 y.o.yo femalewho was admitted on 10/14/2019with recurrent A on CRF after pyelo- dialysis initiated 9/27 at [facility]- then was found to have CVA- transferred to [facility]    1. Renal-intermittently HD dep- Done 10/12 then 10/19. [place] placed 10/7. Prior to most recent admission crt was around 4 and now I see that BUN and crt were worsening daily off of HD- I do not think is likely she will be able to do without HD- I think would be best to send out on HD but continuing to lookfor signs of recovery. Now looking into SNF ?  Has now been accepted at [facility].  - Appreciate VVS seeing the pt for a permanentaccessplacement; already very advanced renal failure prior to admission and no signs of recovery. LUA AVG placed 10/30 and today has a great bruit.  - Next HD Thursday (doesn't appear to have a lot of fluid onboard; will UF only 1 L)  2. HTN/vol- NO BP meds here or recorded as OP- have started coreg since pulse high , - needs UF with HD also for edema I think this will be the key  3. Anemia- hgb in 8's, improving, now 9.5- is on ESA- last iron stores OK 4. CVA- on rehab/keppra  5. GU- recent pyelo- stents in place - I guess that GU here wants her to get stents removed/changed where she had them put in at [facility]- not sure how practical that might be  6. Bones-PTH was 237- started vit D - phos cont to rise -> change from phosloto fosrenol. She rx 2 doses of lanthanum on 10/29. Will recheck phos on Fri and adjust binders as needed.  Subjective:   Rehab going well.  Denies f/c/n/v/dyspnea.  Had surgery this AM w/ left arm access successfully placed.  Hungry    Objective:   BP 130/63 (BP Location: Right Arm)   Pulse 74   Temp (!) 97.5 F (36.4 C)   Resp 18   Ht 5' (1.524 m)   Wt 39.8 kg   SpO2 100%   BMI 17.14 kg/m   Intake/Output Summary (Last 24 hours) at  12/15/2017 1252 Last data filed at 12/15/2017 6629 Gross per 24 hour  Intake 378.47 ml  Output 2124 ml  Net -1745.53 ml   Weight change: 3.6 kg  Physical Exam: General: NAD- not great historian but pleasant Heart: RRR Lungs: mostly clear Abdomen: distended, slightly tender Extremities: trace edema Dialysis Access: TDC on right, LUA mildly tender but great bruit in AVG  Imaging: No results found.  Labs: BMET Recent Labs  Lab 12/08/17 1352 12/10/17 1303 12/11/17 1330 12/14/17 0515 12/14/17 1440 12/15/17 0614  NA 134* 132* 130*  --  132* 134*  K 3.5 4.3 3.8  --  4.0 3.7  CL 96* 97* 96*  --  96* 101  CO2 21* 20* 21*  --  20* 21*  GLUCOSE 194* 242* 203*  --  143* 59*  BUN 67* 44* 70*  --  77* 27*  CREATININE 6.35* 4.82* 6.02*  --  6.09* 3.04*  CALCIUM 8.9 9.2 8.6*  --  9.0 9.0  PHOS 8.1* 7.8* 7.5* 8.3* 8.1*  --    CBC Recent Labs  Lab 12/10/17 1303 12/11/17 1311 12/14/17 1440 12/15/17 0614  WBC 8.5 9.1 8.5 9.5  HGB 11.1* 9.5* 10.1* 11.0*  HCT 38.2 31.5* 33.9* 36.8  MCV 98.2 97.5 97.7 98.4  PLT 296 286 275 259    Medications:    . calcitRIOL  0.25 mcg Oral Q T,Th,Sa-HD  . carvedilol  12.5 mg Oral BID WC  . Chlorhexidine Gluconate Cloth  6 each Topical Q0600  . darbepoetin (ARANESP) injection - DIALYSIS  150 mcg Subcutaneous Q Thu-1800  . dextrose      . feeding supplement (NEPRO CARB STEADY)  237 mL Oral BID WC  . heparin  5,000 Units Subcutaneous Q8H  . insulin aspart  0-5 Units Subcutaneous QHS  . insulin aspart  0-9 Units Subcutaneous TID WC  . insulin glargine  12 Units Subcutaneous Daily  . lanthanum  750 mg Oral TID WC  . levETIRAcetam  1,000 mg Oral Daily  . levETIRAcetam  500 mg Oral Q T,Th,Sa-HD  . multivitamin  1 tablet Oral QHS  . nicotine  14 mg Transdermal Daily  . phenytoin  150 mg Oral BID  . saccharomyces boulardii  250 mg Oral BID  . topiramate  25 mg Oral BID      Otelia Santee, MD 12/15/2017, 12:52 PM

## 2017-12-15 NOTE — Anesthesia Procedure Notes (Signed)
Procedure Name: MAC Date/Time: 12/15/2017 7:27 AM Performed by: Neldon Newport, CRNA Pre-anesthesia Checklist: Timeout performed, Patient being monitored, Suction available, Emergency Drugs available and Patient identified Patient Re-evaluated:Patient Re-evaluated prior to induction Oxygen Delivery Method: Simple face mask Placement Confirmation: positive ETCO2 Dental Injury: Teeth and Oropharynx as per pre-operative assessment

## 2017-12-15 NOTE — Progress Notes (Signed)
Physical Therapy Discharge Summary  Patient Details  Name: Erica [name] MRN: [id number] Date of Birth: 1955/11/29  Today's Date: 12/15/2017    Patient has met 10 of 10 long term goals due to improved activity tolerance, improved balance, improved postural control, increased strength, ability to compensate for deficits, functional use of  right upper extremity, right lower extremity, left upper extremity and left lower extremity, improved awareness and improved coordination.  Patient to discharge at an ambulatory level supervision with RW.   No family/friends have been present for hands on caregiver training during pt's stay in CIR.  Reasons goals not met: n/a  Recommendation:  Patient will benefit from ongoing skilled PT services in home health setting to continue to advance safe functional mobility, address ongoing impairments in endurance, strength, balance, and minimize fall risk.  Equipment: RW  Reasons for discharge: treatment goals met and discharge from hospital  Patient/family agrees with progress made and goals achieved: Yes  PT Discharge Precautions/Restrictions Precautions Precautions: Fall Precaution Comments: seizures Restrictions Weight Bearing Restrictions: No   Cognition Overall Cognitive Status: History of cognitive impairments - at baseline Memory: Impaired Memory Impairment: Decreased recall of new information;Decreased short term memory Awareness: Impaired Awareness Impairment: Anticipatory impairment;Emergent impairment Problem Solving: Impaired Problem Solving Impairment: Functional basic Safety/Judgment: Impaired   Sensation Pt has reported increased numbness in LLE with increased gait distance, with MD suspecting neurogenic claudication.  Motor  Motor Motor - Skilled Clinical Observations: generalized deconditioning Motor - Discharge Observations: generalized deconditioning   Mobility Bed Mobility Bed Mobility: Rolling Right;Rolling  Left;Supine to Sit;Sit to Supine Rolling Right: Independent with assistive device Rolling Left: Independent with assistive device Supine to Sit: Independent with assistive device Sit to Supine: Independent with assistive device Transfers Transfers: Sit to Stand Sit to Stand: Supervision/Verbal cueing(RW)  Locomotion  Gait Ambulation: Yes Gait Assistance: Supervision/Verbal cueing Gait Distance (Feet): 150 Feet Assistive device: Rolling walker Gait Assistance Details: verbal cuing to increase gait speed Gait Gait: Yes Gait Pattern: Decreased step length - right;Decreased step length - left;Decreased stride length(decreased gait speed) Stairs / Additional Locomotion Stairs: Yes Stairs Assistance: Supervision/Verbal cueing Stair Management Technique: Two rails Number of Stairs: 12 Height of Stairs: (6" + 3") Wheelchair Mobility Wheelchair Mobility: No   Trunk/Postural Assessment  Cervical Assessment Cervical Assessment: Within Functional Limits Thoracic Assessment Thoracic Assessment: Within Functional Limits Postural Control Postural Control: Deficits on evaluation Righting Reactions: delayed Protective Responses: delayed   Balance Balance Balance Assessed: Yes Dynamic Standing Balance Dynamic Standing - Balance Support: No upper extremity supported Dynamic Standing - Level of Assistance: 3: Mod assist Dynamic Standing - Balance Activities: (during gait without AD)   Extremity Assessment  RUE Assessment RUE Assessment: Within Functional Limits LUE Assessment LUE Assessment: Within Functional Limits RLE Assessment RLE Assessment: Within Functional Limits -- grossly 3+/5 as pt able to weight bear during standing/gait LLE Assessment LLE Assessment: Within Functional Limits -- grossly 3+/5 as pt able to weight bear during standing/gait    [name] 12/15/2017, 2:10 PM

## 2017-12-15 NOTE — Progress Notes (Signed)
Physical Therapy Note  Patient Details  Name: Erica [name] MRN: [id number] Date of Birth: 09/05/1955 Today's Date: 12/15/2017    Pt received in bed after just returning from procedure & requesting to rest, noting BUE soreness. Pt left in bed with needs at hand.   [name] 12/15/2017, 10:33 AM

## 2017-12-15 NOTE — Transfer of Care (Signed)
Immediate Anesthesia Transfer of Care Note  Patient: Erica [name]  Procedure(s) Performed: INSERTION OF ARTERIOVENOUS (AV) GORE-TEX GRAFT ARM (Left Arm Upper)  Patient Location: PACU  Anesthesia Type:MAC  Level of Consciousness: awake, alert  and oriented  Airway & Oxygen Therapy: Patient Spontanous Breathing  Post-op Assessment: Report given to RN, Post -op Vital signs reviewed and stable and Patient moving all extremities X 4  Post vital signs: Reviewed and stable  Last Vitals:  Vitals Value Taken Time  BP 147/77 12/15/2017  9:11 AM  Temp    Pulse 75 12/15/2017  9:11 AM  Resp 14 12/15/2017  9:11 AM  SpO2 97 % 12/15/2017  9:11 AM  Vitals shown include unvalidated device data.  Last Pain:  Vitals:   12/15/17 0521  TempSrc: Oral  PainSc:          Complications: No apparent anesthesia complications

## 2017-12-15 NOTE — Progress Notes (Signed)
Occupational Therapy Note  Patient Details  Name: ELLERIE ARENZ MRN: [id number] Date of Birth: Oct 17, 1955  Today's Date: 12/15/2017 OT Missed Time: 19 Minutes Missed Time Reason: Unavailable (comment)(off unit)  Pt missed scheduled OT treatment session due to being off unit for procedure.     Simonne Come 12/15/2017, 10:58 AM

## 2017-12-15 NOTE — Interval H&P Note (Signed)
History and Physical Interval Note:  12/15/2017 7:15 AM  Erica [name]  has presented today for surgery, with the diagnosis of END STAGE RENAL DISEASE  The various methods of treatment have been discussed with the patient and family. After consideration of risks, benefits and other options for treatment, the patient has consented to  Procedure(s): ARTERIOVENOUS (AV) FISTULA CREATION VERSUS GRAFT (Left) as a surgical intervention .  The patient's history has been reviewed, patient examined, no change in status, stable for surgery.  I have reviewed the patient's chart and labs.  Questions were answered to the patient's satisfaction.     Curt Jews

## 2017-12-15 NOTE — Anesthesia Postprocedure Evaluation (Signed)
Anesthesia Post Note  Patient: Erica [name]  Procedure(s) Performed: INSERTION OF ARTERIOVENOUS (AV) GORE-TEX GRAFT ARM (Left Arm Upper)     Patient location during evaluation: PACU Anesthesia Type: MAC Level of consciousness: awake and alert Pain management: pain level controlled Vital Signs Assessment: post-procedure vital signs reviewed and stable Respiratory status: spontaneous breathing and respiratory function stable Cardiovascular status: stable Postop Assessment: no apparent nausea or vomiting Anesthetic complications: no    Last Vitals:  Vitals:   12/15/17 0926 12/15/17 0941  BP: (!) 142/72 130/69  Pulse: 85 73  Resp: 15 14  Temp:  (!) 36.4 C  SpO2: 99% 97%    Last Pain:  Vitals:   12/15/17 0941  TempSrc:   PainSc: 0-No pain                 Lashell Moffitt DANIEL

## 2017-12-15 NOTE — Op Note (Signed)
    OPERATIVE REPORT  DATE OF SURGERY: 12/15/2017  PATIENT: Erica [name], 62 y.o. female MRN: [id number]  DOB: [birth year]  PRE-OPERATIVE DIAGNOSIS: End-stage renal disease  POST-OPERATIVE DIAGNOSIS:  Same  PROCEDURE: Left upper arm AV Gore-Tex graft  SURGEON:  Curt Jews, M.D.  PHYSICIAN ASSISTANT: Matt Eveland, PA-C  ANESTHESIA: Local with sedation  EBL: per anesthesia record  Total I/O In: 378.5 [I.V.:328.5; IV Piggyback:50] Out: 10 [Blood:10]  BLOOD ADMINISTERED: none  DRAINS: none  SPECIMEN: none  COUNTS CORRECT:  YES  PATIENT DISPOSITION:  PACU - hemodynamically stable  PROCEDURE DETAILS: The patient was taken to the operating placed supine position where the area of the left arm and left axilla were prepped and draped in usual sterile fashion.  SonoSite ultrasound was used to visualize the veins which were extremely small in both the cephalic and basilic veins.  Patient had small brachial arteries and it looked as though there was a high bifurcation of the brachial artery.  There was a large axillary artery and axillary vein.  Using local anesthesia incision was made over the axilla and carried down to isolate the artery and vein which were of good caliber.  A separate incision was made above the antecubital space and a loop configuration tunnel was created.  A 4 x 7 taper Gore-Tex graft was brought through the tunnel.  The brachial artery was occluded proximally and distally and was opened with an 11 blade and extended longitudinally with Potts scissors.  A small arteriotomy was created.  The 4 mm portion of the graft was spatulated and sewn end-to-side to the artery with a running 6-0 Prolene suture.  This anastomosis was tested and found to be adequate.  The graft was flushed with heparinized saline and reoccluded.  Next the axillary vein was occluded proximally and distally and was opened with an 11 blade and extended longitudinally with Potts scissors.  The graft  was cut to the appropriate length and was spatulated and sewn end-to-side to the vein with a running 6-0 Prolene suture.  Clamps were removed and excellent thrill was noted.  The patient maintained a easily palpable left radial pulse.  The wounds were irrigated with saline.  Hemostasis talus cautery.  The wounds were closed with 3-0 Vicryl in the subcutaneous and subcuticular tissue.  Sterile dressing was applied and the patient was transferred to the recovery room in stable condition   Rosetta Posner, M.D., [facility] 12/15/2017 9:12 AM

## 2017-12-16 ENCOUNTER — Telehealth: Payer: Self-pay | Admitting: Vascular Surgery

## 2017-12-16 ENCOUNTER — Encounter (HOSPITAL_COMMUNITY): Payer: Self-pay | Admitting: Vascular Surgery

## 2017-12-16 LAB — RENAL FUNCTION PANEL
Albumin: 2.4 g/dL — ABNORMAL LOW (ref 3.5–5.0)
Anion gap: 13 (ref 5–15)
BUN: 45 mg/dL — ABNORMAL HIGH (ref 8–23)
CALCIUM: 8.9 mg/dL (ref 8.9–10.3)
CHLORIDE: 98 mmol/L (ref 98–111)
CO2: 19 mmol/L — AB (ref 22–32)
Creatinine, Ser: 4.64 mg/dL — ABNORMAL HIGH (ref 0.44–1.00)
GFR calc Af Amer: 11 mL/min — ABNORMAL LOW (ref >60)
GFR calc non Af Amer: 9 mL/min — ABNORMAL LOW (ref >60)
GLUCOSE: 304 mg/dL — AB (ref 70–99)
Phosphorus: 7.9 mg/dL — ABNORMAL HIGH (ref 2.5–4.6)
Potassium: 4 mmol/L (ref 3.5–5.1)
SODIUM: 130 mmol/L — AB (ref 135–145)

## 2017-12-16 LAB — CBC
HCT: 34.5 % — ABNORMAL LOW (ref 36.0–46.0)
Hemoglobin: 10.3 g/dL — ABNORMAL LOW (ref 12.0–15.0)
MCH: 29.6 pg (ref 26.0–34.0)
MCHC: 29.9 g/dL — AB (ref 30.0–36.0)
MCV: 99.1 fL (ref 80.0–100.0)
PLATELETS: 244 10*3/uL (ref 150–400)
RBC: 3.48 MIL/uL — ABNORMAL LOW (ref 3.87–5.11)
RDW: 18.8 % — ABNORMAL HIGH (ref 11.5–15.5)
WBC: 10 10*3/uL (ref 4.0–10.5)
nRBC: 0 % (ref 0.0–0.2)

## 2017-12-16 LAB — GLUCOSE, CAPILLARY
GLUCOSE-CAPILLARY: 279 mg/dL — AB (ref 70–99)
Glucose-Capillary: 173 mg/dL — ABNORMAL HIGH (ref 70–99)
Glucose-Capillary: 157 mg/dL — ABNORMAL HIGH (ref 70–99)

## 2017-12-16 MED ORDER — POLYETHYLENE GLYCOL 3350 17 G PO PACK: 17.0000 g | PACK | Freq: Every day | ORAL | 0 refills | Status: DC | PRN

## 2017-12-16 MED ORDER — NICOTINE 14 MG/24HR TD PT24: 14.0000 mg | MEDICATED_PATCH | Freq: Every day | TRANSDERMAL | 0 refills | Status: DC

## 2017-12-16 MED ORDER — CALCITRIOL 0.25 MCG PO CAPS: 0.2500 ug | ORAL_CAPSULE | ORAL | Status: DC

## 2017-12-16 MED ORDER — HEPARIN SODIUM (PORCINE) 1000 UNIT/ML IJ SOLN
INTRAMUSCULAR | Status: AC
  Filled 2017-12-16: qty 4

## 2017-12-16 MED ORDER — PHENYTOIN 125 MG/5ML PO SUSP: 150.0000 mg | Freq: Two times a day (BID) | ORAL | 12 refills | Status: DC

## 2017-12-16 MED ORDER — TRAZODONE HCL 50 MG PO TABS: 25.0000 mg | ORAL_TABLET | Freq: Every evening | ORAL | Status: DC | PRN

## 2017-12-16 MED ORDER — FERRIC CITRATE 1 GM 210 MG(FE) PO TABS
420.0000 mg | ORAL_TABLET | Freq: Three times a day (TID) | ORAL | Status: DC
  Administered 2017-12-16: 420 mg via ORAL
  Filled 2017-12-16 (×2): qty 2

## 2017-12-16 MED ORDER — CEPHALEXIN 250 MG PO CAPS: 250.0000 mg | ORAL_CAPSULE | Freq: Two times a day (BID) | ORAL | 0 refills | Status: AC

## 2017-12-16 MED ORDER — LEVETIRACETAM 1000 MG PO TABS: 1000.0000 mg | ORAL_TABLET | Freq: Every day | ORAL | Status: DC

## 2017-12-16 MED ORDER — INSULIN GLARGINE 100 UNIT/ML ~~LOC~~ SOLN: 12.0000 [IU] | Freq: Every day | SUBCUTANEOUS | 11 refills | Status: DC

## 2017-12-16 MED ORDER — DARBEPOETIN ALFA 150 MCG/0.3ML IJ SOSY: 150.0000 ug | PREFILLED_SYRINGE | INTRAMUSCULAR | Status: DC

## 2017-12-16 MED ORDER — CHLORHEXIDINE GLUCONATE CLOTH 2 % EX PADS: 6.0000 | MEDICATED_PAD | Freq: Every day | CUTANEOUS | Status: DC

## 2017-12-16 MED ORDER — ACETAMINOPHEN 325 MG PO TABS: 325.0000 mg | ORAL_TABLET | ORAL | Status: DC | PRN

## 2017-12-16 MED ORDER — SACCHAROMYCES BOULARDII 250 MG PO CAPS: 250.0000 mg | ORAL_CAPSULE | Freq: Two times a day (BID) | ORAL | Status: DC

## 2017-12-16 MED ORDER — CARVEDILOL 12.5 MG PO TABS: 12.5000 mg | ORAL_TABLET | Freq: Two times a day (BID) | ORAL | Status: DC

## 2017-12-16 MED ORDER — LEVETIRACETAM 500 MG PO TABS: 500.0000 mg | ORAL_TABLET | ORAL | Status: DC

## 2017-12-16 MED ORDER — FERRIC CITRATE 1 GM 210 MG(FE) PO TABS: 420.0000 mg | ORAL_TABLET | Freq: Three times a day (TID) | ORAL | Status: DC

## 2017-12-16 NOTE — Progress Notes (Signed)
Recreational Therapy Discharge Summary Patient Details  Name: Erica [name] MRN: [id number] Date of Birth: 02-Dec-1955 Today's Date: 12/16/2017  Comments on progress toward goals: Pt has made good progress during LOS and is ready for discharge to SNF for continued therapies and 24 hour supervision.  TR session focused on activity tolerance, dynamic standing balance and energy conservation.  PT is discharging at overall supervision level.   Reasons for discharge: discharge from hospital  Patient/family agrees with progress made and goals achieved: Yes  [name] 12/16/2017, 11:55 AM

## 2017-12-16 NOTE — Progress Notes (Signed)
Social Work  Discharge Note  The overall goal for the admission was met for:   Discharge location: Yes-[facility]-SNF  Length of Stay: No-17 DAYS  Discharge activity level: Yes-SUPERVISION-MIN LEVEL   Home/community participation: Yes  Services provided included: MD, RD, PT, OT, SLP, RN, CM, TR, Pharmacy and SW  Financial Services: Medicaid  Follow-up services arranged: Other: NHP  Comments (or additional information):PT NEEDS MORE REHAB BEFORE GOING HOME WITH SON  Patient/Family verbalized understanding of follow-up arrangements: Yes  Individual responsible for coordination of the follow-up plan: CALVIN-SON  Confirmed correct DME delivered: Elease Hashimoto 12/16/2017    Elease Hashimoto

## 2017-12-16 NOTE — Progress Notes (Signed)
Patient is scheduled for Discharge to SNF today. Upon shift assessment Patient is alert and oriented x 4. Pleasant on approach. Routine medications administered. Denies pain or discomfort at present.  No s/s acute distress noted. Will continue to monitor and assist as needed. Call light within reach; Bed in lowest position; Bed alarm on and functioning properly.

## 2017-12-16 NOTE — Progress Notes (Signed)
  Progress Note    12/16/2017 8:54 AM 1 Day Post-Op  Subjective:  Patient was seen during hemodialysis treatment.  She has pain and tenderness around tunneled graft in L upper arm.   Vitals:   12/16/17 0717 12/16/17 0730  BP: (!) 155/79 (!) 149/73  Pulse: 84 83  Resp:    Temp:    SpO2:     Physical Exam: Lungs:  Non labored Incisions:  L axilla and counter incision c/d/i without firm hematoma Extremities:  Palpable thrill and audible bruit; tenderness along path of tunneled graft; 1+ L radial pulse Neurologic: A&O  CBC    Component Value Date/Time   WBC 10.0 12/16/2017 0729   RBC 3.48 (L) 12/16/2017 0729   HGB 10.3 (L) 12/16/2017 0729   HCT 34.5 (L) 12/16/2017 0729   PLT 244 12/16/2017 0729   MCV 99.1 12/16/2017 0729   MCH 29.6 12/16/2017 0729   MCHC 29.9 (L) 12/16/2017 0729   RDW 18.8 (H) 12/16/2017 0729   LYMPHSABS 1.9 11/27/2017 0401   MONOABS 1.0 11/27/2017 0401   EOSABS 0.3 11/27/2017 0401   BASOSABS 0.0 11/27/2017 0401    BMET    Component Value Date/Time   NA 130 (L) 12/16/2017 0729   K 4.0 12/16/2017 0729   CL 98 12/16/2017 0729   CO2 19 (L) 12/16/2017 0729   GLUCOSE 304 (H) 12/16/2017 0729   BUN 45 (H) 12/16/2017 0729   CREATININE 4.64 (H) 12/16/2017 0729   CALCIUM 8.9 12/16/2017 0729   GFRNONAA 9 (L) 12/16/2017 0729   GFRAA 11 (L) 12/16/2017 0729    INR    Component Value Date/Time   INR 1.33 12/15/2017 0614     Intake/Output Summary (Last 24 hours) at 12/16/2017 0854 Last data filed at 12/15/2017 1300 Gross per 24 hour  Intake 498.47 ml  Output -  Net 498.47 ml     Assessment/Plan:  62 y.o. female is s/p L upper arm AV loop graft 1 Day Post-Op   Palpable L radial however some numbness L hand; patient believes symptoms are tolerable L arm AV graft patent with palpable thrill and audible bruit Ok to discharge from vascular standpoint We will arrange outpt follow up in 2 weeks to monitor steal and check incisions   Dagoberto Ligas, PA-C [facility] [phone] 12/16/2017 8:54 AM

## 2017-12-16 NOTE — Progress Notes (Signed)
[place] for phenytoin dosing Indication: Patient safety/ therapeutic monitoring  Allergies  Allergen Reactions  . Aspirin Palpitations   Patient Measurements: Height: 5' (152.4 cm) Weight: 92 lb 6 oz (41.9 kg) IBW/kg (Calculated) : 45.5  Vital Signs: Temp: 98.5 F (36.9 C) (10/31 0711) Temp Source: Oral (10/31 0711) BP: 149/73 (10/31 0730) Pulse Rate: 83 (10/31 0730) Intake/Output from previous day: 10/30 0701 - 10/31 0700 In: 498.5 [P.O.:120; I.V.:328.5; IV Piggyback:50] Out: 10 [Blood:10] Intake/Output from this shift: No intake/output data recorded.  Labs: Recent Labs    12/14/17 0515 12/14/17 1440 12/15/17 0614 12/16/17 0729  WBC  --  8.5 9.5 10.0  HGB  --  10.1* 11.0* 10.3*  HCT  --  33.9* 36.8 34.5*  PLT  --  275 259 244  CREATININE  --  6.09* 3.04* 4.64*  PHOS 8.3* 8.1*  --  7.9*  ALBUMIN  --  2.5*  --  2.4*   Estimated Creatinine Clearance: 8.2 mL/min (A) (by C-G formula based on SCr of 4.64 mg/dL (H)).  Assessment: 62 yo F recently initiated on dialysis started on phenytoin inpatient for breakthrough seizures. Neurology initially followed pt & wanted pharmacy to take over dosing for phenytoin. Pt's dose was reduced from 100mg  q8h to 125mg  q12h on 10/2.   Last total phenytoin level was 4.3 on 10/24  which corrects to ~13 with low albumin with new chronic HD. No further seizures noted. No seizures noted.  Pt is still stable on dose. We will repeat level if she is still here next week.   Monitoring Corrected phenytoin level = 10-56mcg/mL  Plan:  Continue phenytoin 150mg  PO BID Monitor clinical status, seizures, phenytoin levels prn  Onnie Boer, PharmD, [place], AAHIVP, CPP Infectious Disease Pharmacist Pager: [phone] 12/16/2017 9:20 AM

## 2017-12-16 NOTE — Progress Notes (Signed)
Patient Discharged to [facility] with personal belongings transported by [place].

## 2017-12-16 NOTE — Progress Notes (Addendum)
[facility] Progress Note    Assessment/ Plan:   Pt is a11 y.o.yo femalewho was admitted on 10/14/2019with recurrent A on CRF after pyelo- dialysis initiated 9/27 at [facility]- then was found to have CVA- transferred to [facility]    1. Renal-intermittently HD dep- Done 10/12 then 10/19. [place] placed 10/7. Prior to most recent admission crt was around 4 and now I see that BUN and crt were worsening daily off of HD- I do not think is likely she will be able to do without HD- I think would be best to send out on HD but continuing to lookfor signs of recovery. Now looking into SNF ?  Has now been accepted at [facility].  -AppreciateVVSseeing the ptfor a permanentaccessplacement; already very advanced renal failure prior to admission and no signs of recovery. LUA AVG placed 10/30 and today has a great bruit.  Seen on HD at [facility] Thru [facility] TC 137/72 UF 1.5L 4K bath No changes; tolerating treatment  2. HTN/vol- NO BP meds here or recorded as OP- have started coreg since pulse high , - needs UF with HD also for edema I think this will be the key  3. Anemia- hgb in 8's, improving, now 9.5- is on ESA- last iron stores OK 4. CVA- on rehab/keppra  5. GU- recent pyelo- stents in place - I guess that GU here wants her to get stents removed/changed where she had them put in at [facility]- not sure how practical that might be  6. Bones-PTH was 237- started vit D - phos cont to rise   - have tried phoslo and fosrenol but fosrenol will be issues bec of chewing. - Start Auryxia 2 tabs tidm   Subjective:   Rehab going well.  Denies f/c/n/v/dyspnea.  Had surgery 10/30 w/ left arm access successfully placed.  Mild discomfort in left arm   Objective:   BP 115/61   Pulse 82   Temp 98.5 F (36.9 C) (Oral)   Resp 14   Ht 5' (1.524 m)   Wt 41.9 kg   SpO2 99%   BMI 18.04 kg/m   Intake/Output Summary (Last 24 hours) at 12/16/2017 1021 Last  data filed at 12/15/2017 1300 Gross per 24 hour  Intake 120 ml  Output -  Net 120 ml   Weight change: -5.9 kg  Physical Exam: General: NAD- not great historianbut pleasant Heart: RRR Lungs: mostly clear Abdomen: distended, slightly tender Extremities: trace edema Dialysis Access: TDC on right, LUA mildly tender but great bruit in loop AVG  Imaging: No results found.  Labs: BMET Recent Labs  Lab 12/10/17 1303 12/11/17 1330 12/14/17 0515 12/14/17 1440 12/15/17 0614 12/16/17 0729  NA 132* 130*  --  132* 134* 130*  K 4.3 3.8  --  4.0 3.7 4.0  CL 97* 96*  --  96* 101 98  CO2 20* 21*  --  20* 21* 19*  GLUCOSE 242* 203*  --  143* 59* 304*  BUN 44* 70*  --  77* 27* 45*  CREATININE 4.82* 6.02*  --  6.09* 3.04* 4.64*  CALCIUM 9.2 8.6*  --  9.0 9.0 8.9  PHOS 7.8* 7.5* 8.3* 8.1*  --  7.9*   CBC Recent Labs  Lab 12/11/17 1311 12/14/17 1440 12/15/17 0614 12/16/17 0729  WBC 9.1 8.5 9.5 10.0  HGB 9.5* 10.1* 11.0* 10.3*  HCT 31.5* 33.9* 36.8 34.5*  MCV 97.5 97.7 98.4 99.1  PLT 286 275 259 244  Medications:    . calcitRIOL  0.25 mcg Oral Q T,Th,Sa-HD  . carvedilol  12.5 mg Oral BID WC  . Chlorhexidine Gluconate Cloth  6 each Topical Q0600  . [START ON 12/23/2017] darbepoetin (ARANESP) injection - DIALYSIS  150 mcg Subcutaneous Q Thu-1800  . feeding supplement (NEPRO CARB STEADY)  237 mL Oral BID WC  . heparin  5,000 Units Subcutaneous Q8H  . insulin aspart  0-5 Units Subcutaneous QHS  . insulin aspart  0-9 Units Subcutaneous TID WC  . insulin glargine  12 Units Subcutaneous Daily  . lanthanum  750 mg Oral TID WC  . levETIRAcetam  1,000 mg Oral Daily  . levETIRAcetam  500 mg Oral Q T,Th,Sa-HD  . multivitamin  1 tablet Oral QHS  . nicotine  14 mg Transdermal Daily  . phenytoin  150 mg Oral BID  . saccharomyces boulardii  250 mg Oral BID  . topiramate  25 mg Oral BID      Otelia Santee, MD 12/16/2017, 10:21 AM

## 2017-12-16 NOTE — Progress Notes (Signed)
Occupational Therapy Discharge Summary  Patient Details  Name: Erica [name] MRN: [id number] Date of Birth: [birth year]  Patient has met 12 of 12 long term goals due to improved activity tolerance, improved balance, postural control, ability to compensate for deficits, functional use of  RIGHT upper and LEFT upper extremity and improved awareness.  Patient to discharge at overall Supervision level.  Patient's care partner unavailable to provide the necessary cognitive assistance at discharge.  Patient's son is unable to provide the recommended 24/7 supervision.  Reasons goals not met: N/A  Recommendation:  Patient will benefit from ongoing skilled OT services in skilled nursing facility setting to continue to advance functional skills in the area of BADL and Reduce care partner burden.  Equipment: No equipment provided  Reasons for discharge: treatment goals met and discharge from hospital  Patient/family agrees with progress made and goals achieved: Yes  OT Discharge Precautions/Restrictions  Precautions Precautions: Fall Precaution Comments: seizures Vital Signs Therapy Vitals Temp: 98.5 F (36.9 C) Temp Source: Oral Pulse Rate: 83 Resp: 14 BP: (!) 149/73 Patient Position (if appropriate): Lying Oxygen Therapy SpO2: 99 % O2 Device: Room Air Pain Pain Assessment Pain Scale: 0-10 Pain Score: 0-No pain Pain Type: Acute pain;Surgical pain Pain Location: Arm Pain Orientation: Left Pain Frequency: Intermittent Pain Onset: With Activity Pain Intervention(s): Medication (See eMAR) ADL ADL Eating: Set up Grooming: Supervision/safety, Setup Where Assessed-Grooming: Standing at sink Upper Body Bathing: Supervision/safety Where Assessed-Upper Body Bathing: Sitting at sink Lower Body Bathing: Supervision/safety Where Assessed-Lower Body Bathing: Sitting at sink, Standing at sink Upper Body Dressing: Setup Where Assessed-Upper Body Dressing: Sitting at sink Lower Body  Dressing: Supervision/safety Where Assessed-Lower Body Dressing: Edge of bed Toileting: Supervision/safety Where Assessed-Toileting: Glass blower/designer: Close supervision Armed forces technical officer Method: Counselling psychologist: (standard commode) Tub/Shower Transfer: Close supervison Clinical cytogeneticist Method: Optometrist: Gaffer Baseline Vision/History: Wears glasses Wears Glasses: Reading only Vision Assessment?: Yes Eye Alignment: Within Functional Limits Ocular Range of Motion: Within Functional Limits Alignment/Gaze Preference: Head tilt Tracking/Visual Pursuits: Able to track stimulus in all quads without difficulty Saccades: Within functional limits Cognition Overall Cognitive Status: History of cognitive impairments - at baseline Arousal/Alertness: Awake/alert Orientation Level: Oriented X4 Attention: Selective Sustained Attention: Appears intact Selective Attention: Impaired Memory: Impaired Memory Impairment: Decreased recall of new information;Decreased short term memory Awareness: Impaired Awareness Impairment: Anticipatory impairment;Emergent impairment Problem Solving: Impaired Problem Solving Impairment: Functional basic Safety/Judgment: Impaired Sensation Sensation Light Touch: Appears Intact Hot/Cold: Appears Intact Proprioception: Appears Intact Motor  Motor Motor - Skilled Clinical Observations: generalized deconditioning Motor - Discharge Observations: generalized deconditioning Extremity/Trunk Assessment RUE Assessment RUE Assessment: Within Functional Limits Passive Range of Motion (PROM) Comments: WFL Active Range of Motion (AROM) Comments: shoulder flexion limited to grossly 130*  General Strength Comments: grossly 4-/5 shoulder, 4/5 distally RUE Body System: Neuro Brunstrum levels for arm and hand: Arm;Hand Brunstrum level for arm: Stage V Relative Independence from Synergy Brunstrum level for  hand: Stage VI Isolated joint movements LUE Assessment LUE Assessment: Within Functional Limits Passive Range of Motion (PROM) Comments: WFL Active Range of Motion (AROM) Comments: shoulder flexion limited to grossly 130* General Strength Comments: strength 4/5 overall   Erica [name] 12/16/2017, 8:47 AM

## 2017-12-16 NOTE — Telephone Encounter (Signed)
sch appt spk to pt son and SNF for transpo 01/03/18 3pm p/o PA

## 2017-12-16 NOTE — Telephone Encounter (Signed)
-----   Message from Mena Goes, RN sent at 12/15/2017  9:03 AM EDT ----- Regarding: 2 weeks in PA clinic postop   ----- Message ----- From: Iline Oven Sent: 12/15/2017   8:56 AM EDT To: [facility] Admin Pool, [facility] Charge Pool   Can you schedule an appt for this pt in 2 weeks on PA clinic.  PO L upper arm loop graft. Thanks, [facility]

## 2017-12-16 NOTE — Progress Notes (Addendum)
Social Work Patient ID: [name], female   DOB: 01-Nov-1955, 62 y.o.   MRN: [id number] Spoke with Shannon-Admissions at [facility] to conform transfer today after HD session. Contacted son-Calvin to inform to go over to facility between 11-2 he will call since he needs to take Dad to the MD appointment today and then go to work. Will set up ambulance once HD finished and she has eaten lunch. Renal-PA to send over pt's records to [facility].

## 2017-12-16 NOTE — Progress Notes (Signed)
Nursing Report called to [place], LPN @ [place].

## 2017-12-16 NOTE — Plan of Care (Signed)
  Problem: Spiritual Needs Goal: Ability to function at adequate level 12/16/2017 1235 by Dietrich Pates, RN Outcome: Adequate for Discharge 12/16/2017 1234 by Dietrich Pates, RN Outcome: Progressing   Problem: RH BOWEL ELIMINATION Goal: RH STG MANAGE BOWEL WITH ASSISTANCE Description STG Manage Bowel with mod I Assistance.  12/16/2017 1235 by Dietrich Pates, RN Outcome: Adequate for Discharge 12/16/2017 1234 by Dietrich Pates, RN Outcome: Progressing   Problem: RH BLADDER ELIMINATION Goal: RH STG MANAGE BLADDER WITH ASSISTANCE Description STG Manage Bladder With mod I Assistance  12/16/2017 1235 by Dietrich Pates, RN Outcome: Adequate for Discharge 12/16/2017 1234 by Dietrich Pates, RN Outcome: Progressing   Problem: RH SKIN INTEGRITY Goal: RH STG SKIN FREE OF INFECTION/BREAKDOWN Description Patients skin will remain free from further infection or breakdown with mod I assist.  12/16/2017 1235 by Dietrich Pates, RN Outcome: Adequate for Discharge 12/16/2017 1234 by Dietrich Pates, RN Outcome: Progressing Goal: RH STG MAINTAIN SKIN INTEGRITY WITH ASSISTANCE Description STG Maintain Skin Integrity With mod I Assistance.  12/16/2017 1235 by Dietrich Pates, RN Outcome: Adequate for Discharge 12/16/2017 1234 by Dietrich Pates, RN Outcome: Progressing   Problem: RH SAFETY Goal: RH STG ADHERE TO SAFETY PRECAUTIONS W/ASSISTANCE/DEVICE Description STG Adhere to Safety Precautions With mod I Assistance/Device.  12/16/2017 1235 by Dietrich Pates, RN Outcome: Adequate for Discharge 12/16/2017 1234 by Dietrich Pates, RN Outcome: Progressing   Problem: RH KNOWLEDGE DEFICIT Goal: RH STG INCREASE KNOWLEDGE OF DIABETES Description Min assist  12/16/2017 1235 by Dietrich Pates, RN Outcome: Adequate for Discharge 12/16/2017 1234 by Dietrich Pates, RN Outcome: Progressing Goal: RH STG INCREASE KNOWLEDGE OF HYPERTENSION Description Min  assist  12/16/2017 1235 by Dietrich Pates, RN Outcome: Adequate for Discharge 12/16/2017 1234 by Dietrich Pates, RN Outcome: Progressing Goal: RH STG INCREASE KNOWLEGDE OF HYPERLIPIDEMIA Description Min assist  12/16/2017 1235 by Dietrich Pates, RN Outcome: Adequate for Discharge 12/16/2017 1234 by Dietrich Pates, RN Outcome: Progressing Goal: RH STG INCREASE KNOWLEDGE OF STROKE PROPHYLAXIS Description Min assist  12/16/2017 1235 by Dietrich Pates, RN Outcome: Adequate for Discharge 12/16/2017 1234 by Dietrich Pates, RN Outcome: Progressing

## 2018-01-10 ENCOUNTER — Other Ambulatory Visit: Payer: Self-pay

## 2018-01-10 ENCOUNTER — Ambulatory Visit (INDEPENDENT_AMBULATORY_CARE_PROVIDER_SITE_OTHER): Payer: Self-pay | Admitting: Physician Assistant

## 2018-01-10 VITALS — BP 143/77 | HR 74 | Temp 99.2°F | Resp 18 | Ht 60.0 in | Wt 95.2 lb

## 2018-01-10 DIAGNOSIS — N186 End stage renal disease: Secondary | ICD-10-CM

## 2018-01-10 DIAGNOSIS — Z992 Dependence on renal dialysis: Secondary | ICD-10-CM

## 2018-01-10 NOTE — Progress Notes (Signed)
POST OPERATIVE OFFICE NOTE    CC:  F/u for surgery  HPI:  This is a 62 y.o. female who is s/p LUA AVG on 12/15/17 by Dr. Donnetta Hutching.  She had a tunneled dialysis catheter placed on 11/22/17 by interventional radiology on 11/22/17.  She was admitted to [facility] with acute on chronic renal failure.  She was admitted to [facility] in September with acute on chronic renal failure.  She was initiated on dialysis by Dr. Lowanda Foster.  She developed left sided weakness and was transferred to [facility].  MR of the brain revealed scattered punctate foci of acute infarction manifest as foci of restricted diffusion within the deep white matter at both frontal parietal vertices. 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  She is on Keppra.  She had carotid duplex that revealed 1-39% bilateral ICA stenosis.  She had a TDC placed by IR on 11/22/17.  Renal feels that she will need dialysis and has requested [facility] consult for permanent dialysis access.    After her hospitalization, she was admitted to [facility] and discharged from there on 12/16/17 to a SNF.   She presents today for follow up.  She states that she does have some numbness of her left hand that is worse when she wakes up but does get better throughout the day.   She states that she in on dialysis T/T/S in [place].  Her catheter is working well and she is looking forward to it being removed.    Allergies  Allergen Reactions  . Aspirin Palpitations    Current Outpatient Medications  Medication Sig Dispense Refill  . acetaminophen (TYLENOL) 325 MG tablet Take 1-2 tablets (325-650 mg total) by mouth every 4 (four) hours as needed for mild pain.    . calcitRIOL (ROCALTROL) 0.25 MCG capsule Take 1 capsule (0.25 mcg total) by mouth Every Tuesday,Thursday,and Saturday with dialysis.    Marland Kitchen carvedilol (COREG) 12.5 MG tablet Take 1 tablet (12.5 mg total) by mouth 2 (two) times daily with a meal.    .  Chlorhexidine Gluconate Cloth 2 % PADS Apply 6 each topically daily at 6 (six) AM.    . Darbepoetin Alfa (ARANESP) 150 MCG/0.3ML SOSY injection Inject 0.3 mLs (150 mcg total) into the skin every Thursday at 6pm. 1.68 mL   . ferric citrate (AURYXIA) 1 GM 210 MG(Fe) tablet Take 2 tablets (420 mg total) by mouth 3 (three) times daily with meals. 270 tablet   . insulin aspart (NOVOLOG) 100 UNIT/ML injection Inject 0-9 Units into the skin 3 (three) times daily with meals. For glucose 121-150 use one unit, for 151-200 use 2 units, for 201 to 250 use 3 units, for 251-300 use 5 units, for 301-350 use 7 units, for 351 or greater use 9 unnits. 8.1 mL 0  . insulin glargine (LANTUS) 100 UNIT/ML injection Inject 0.12 mLs (12 Units total) into the skin daily. 10 mL 11  . levETIRAcetam (KEPPRA) 1000 MG tablet Take 1 tablet (1,000 mg total) by mouth daily.    Marland Kitchen levETIRAcetam (KEPPRA) 500 MG tablet Take 1 tablet (500 mg total) by mouth Every Tuesday,Thursday,and Saturday with dialysis.    Marland Kitchen nicotine (NICODERM CQ - DOSED IN MG/24 HOURS) 14 mg/24hr patch Place 1 patch (14 mg total) onto the skin daily. 28 patch 0  . phenytoin (DILANTIN) 125 MG/5ML suspension Take 6 mLs (150 mg total) by mouth 2 (two) times daily. 237 mL 12  . polyethylene glycol (MIRALAX /  GLYCOLAX) packet Take 17 g by mouth daily as needed for mild constipation. 14 each 0  . saccharomyces boulardii (FLORASTOR) 250 MG capsule Take 1 capsule (250 mg total) by mouth 2 (two) times daily.    Marland Kitchen topiramate (TOPAMAX) 25 MG tablet Take 1 tablet (25 mg total) by mouth 2 (two) times daily. 60 tablet 0  . traZODone (DESYREL) 50 MG tablet Take 0.5-1 tablets (25-50 mg total) by mouth at bedtime as needed for sleep.     No current facility-administered medications for this visit.      ROS:  See HPI  Physical Exam: Today's Vitals   01/10/18 1555  BP: (!) 143/77  Pulse: 74  Resp: 18  Temp: 99.2 F (37.3 C)  SpO2: 100%  Weight: 95 lb 3.2 oz (43.2 kg)    Height: 5' (1.524 m)  PainSc: 3    Body mass index is 18.59 kg/m.  Incision:  Well healed Extremities:  The graft is patent with excellent thrill; her radial and ulnar pulses are not palpable.  She does have a monophasic brisk radial doppler signal (augments with compression of the graft) as well as palmar arch signal.  The left ulnar is present but faint.  She is able to grip fingers and her sensation is in tact.  There is no tissue loss.  Her hand is warm.    Assessment/Plan:  This is a 62 y.o. female who is s/p: LUA AVG on 12/15/17 by Dr. Donnetta Hutching  -She does have some numbness of her left hand after placement of AVG.  Her pulses are not palpable, however, she does have a monophasic brisk left radial and palmar arch signal and a faint left ulnar doppler signal is present. Her radial signal augments with compression of the graft.   Discussed with pt that if this becomes bothersome or if she has decreased motor function or has tissue loss, we would have to consider ligation of graft and new access planning.   -may access graft on January 18, 2018.  She will f/u with [facility] as needed. She knows to contact us should she develop the above sx.  -once the graft is being used successfully several times, may schedule with IR to have tunneled dialysis catheter removed.    Leontine Locket, PA-C [facility] [phone]  Clinic MD:  Trula Slade

## 2018-01-21 ENCOUNTER — Encounter: Payer: Medicaid Other | Attending: Physical Medicine & Rehabilitation

## 2018-01-21 ENCOUNTER — Inpatient Hospital Stay: Payer: Medicaid Other | Admitting: Physical Medicine & Rehabilitation

## 2018-01-21 ENCOUNTER — Other Ambulatory Visit (HOSPITAL_COMMUNITY): Payer: Self-pay | Admitting: Nephrology

## 2018-01-21 DIAGNOSIS — Z992 Dependence on renal dialysis: Principal | ICD-10-CM

## 2018-01-21 DIAGNOSIS — N186 End stage renal disease: Secondary | ICD-10-CM

## 2018-01-26 ENCOUNTER — Ambulatory Visit (HOSPITAL_COMMUNITY)
Admission: RE | Admit: 2018-01-26 | Discharge: 2018-01-26 | Disposition: A | Discharge status: Home / Self Care | Payer: Medicaid Other | Source: Ambulatory Visit | Attending: Nephrology | Admitting: Nephrology

## 2018-01-26 ENCOUNTER — Encounter: Payer: Self-pay | Admitting: Physician Assistant

## 2018-01-26 DIAGNOSIS — N186 End stage renal disease: Secondary | ICD-10-CM | POA: Diagnosis not present

## 2018-01-26 DIAGNOSIS — Z452 Encounter for adjustment and management of vascular access device: Secondary | ICD-10-CM | POA: Diagnosis present

## 2018-01-26 DIAGNOSIS — Z992 Dependence on renal dialysis: Secondary | ICD-10-CM | POA: Diagnosis not present

## 2018-01-26 HISTORY — PX: IR REMOVAL TUN CV CATH W/O FL: IMG2289

## 2018-01-26 MED ORDER — CHLORHEXIDINE GLUCONATE 4 % EX LIQD
CUTANEOUS | Status: AC
  Filled 2018-01-26: qty 15

## 2018-01-26 MED ORDER — LIDOCAINE HCL 1 % IJ SOLN
INTRAMUSCULAR | Status: AC
  Filled 2018-01-26: qty 20

## 2018-01-26 MED ORDER — CHLORHEXIDINE GLUCONATE 4 % EX LIQD
CUTANEOUS | Status: DC | PRN
  Administered 2018-01-26: 1 via TOPICAL

## 2018-01-26 MED ORDER — LIDOCAINE HCL 1 % IJ SOLN
INTRAMUSCULAR | Status: DC | PRN
  Administered 2018-01-26: 5 mL

## 2018-01-26 NOTE — Procedures (Signed)
Pre procedural Dx: ESRD Post procedural Dx: Same  Successful removal of tunneled HD catheter.  EBL: None No immediate complications.  Please see imaging section of Epic for full dictation.  Candiss Norse, PA-C

## 2018-03-18 ENCOUNTER — Encounter (HOSPITAL_COMMUNITY): Payer: Self-pay | Admitting: Physician Assistant

## 2018-04-05 ENCOUNTER — Other Ambulatory Visit: Payer: Self-pay

## 2018-04-05 DIAGNOSIS — Z992 Dependence on renal dialysis: Principal | ICD-10-CM

## 2018-04-05 DIAGNOSIS — N186 End stage renal disease: Secondary | ICD-10-CM

## 2018-04-06 ENCOUNTER — Encounter: Payer: Self-pay | Admitting: *Deleted

## 2018-04-06 ENCOUNTER — Other Ambulatory Visit: Payer: Self-pay | Admitting: *Deleted

## 2018-04-06 ENCOUNTER — Other Ambulatory Visit: Payer: Self-pay

## 2018-04-06 ENCOUNTER — Ambulatory Visit (HOSPITAL_COMMUNITY)
Admission: RE | Admit: 2018-04-06 | Discharge: 2018-04-06 | Disposition: A | Discharge status: Home / Self Care | Payer: Medicaid Other | Source: Ambulatory Visit | Attending: Vascular Surgery | Admitting: Vascular Surgery

## 2018-04-06 ENCOUNTER — Ambulatory Visit (INDEPENDENT_AMBULATORY_CARE_PROVIDER_SITE_OTHER): Payer: Medicaid Other | Admitting: Physician Assistant

## 2018-04-06 VITALS — BP 162/77 | HR 70 | Temp 97.9°F | Resp 14 | Ht 59.0 in | Wt 100.6 lb

## 2018-04-06 DIAGNOSIS — Z992 Dependence on renal dialysis: Secondary | ICD-10-CM | POA: Insufficient documentation

## 2018-04-06 DIAGNOSIS — N186 End stage renal disease: Secondary | ICD-10-CM

## 2018-04-06 NOTE — Progress Notes (Signed)
HISTORY AND PHYSICAL     CC:  dialysis access Requesting Provider:  Octavio Graves, DO  HPI: This is a 63 y.o. female here for evaluation for hemodialysis access.  s/p LUA AVG on 12/15/17 by Dr. Donnetta Hutching.  She had a tunneled dialysis catheter placed on 11/22/17 by interventional radiology on 11/22/17 and this was removed on 01/26/18 by IR.  When she is on dialysis, they are not getting the desired flow rates and she is sent here for evaluation.  She denies any prolonged bleeding.  She states that her left hand stays cold.  She does have some numbness in her fingers but this hasn't really changed.   The pt is right hand dominant.   Pt is on dialysis.    If pt on Dialysis:   Dialysis days/center:  [place] with Dr. Lowanda Foster  The pt is not on a statin for cholesterol management.  The pt is diabetic.   The pt is on BB for hypertension.   Tobacco hx:  never The pt is not on a daily aspirin. Other AC:  none  Past Medical History:  Diagnosis Date  . Alcohol-induced pancreatitis   . Chronic diarrhea   . Depression   . Diabetes mellitus    fasting blood sugar 110-120s  . Diastolic CHF ([place])   . DKA (diabetic ketoacidoses) ([place])   . Gastroparesis   . GERD (gastroesophageal reflux disease)   . Heart murmur   . History of kidney stones   . Hyperlipidemia   . Hypertension   . Hypokalemia   . Muscle spasm   . Neuropathic pain   . Neuropathy    Hx: of  . Pyelonephritis   . Recurrent pancreatitis   . Seizures ([place])   . Vitamin B12 deficiency   . Vitamin D deficiency     Past Surgical History:  Procedure Laterality Date  . AV FISTULA PLACEMENT Left 12/15/2017   Procedure: INSERTION OF ARTERIOVENOUS (AV) GORE-TEX GRAFT ARM;  Surgeon: Rosetta Posner, MD;  Location: [place];  Service: Vascular;  Laterality: Left;  . CATARACT EXTRACTION W/PHACO Right 03/14/2015   Procedure: CATARACT EXTRACTION PHACO AND INTRAOCULAR LENS PLACEMENT (IOC);  Surgeon: Baruch Goldmann, MD;  Location: [facility];  Service:  Ophthalmology;  Laterality: Right;  CDE:11.13  . CATARACT EXTRACTION W/PHACO Left 04/11/2015   Procedure: CATARACT EXTRACTION PHACO AND INTRAOCULAR LENS PLACEMENT LEFT EYE CDE=9.68;  Surgeon: Baruch Goldmann, MD;  Location: [facility];  Service: Ophthalmology;  Laterality: Left;  . COLONOSCOPY  02/24/2010  . cystoscopy with ureteral stent  Bilateral 10/07/2017   At [place] CV LINE RIGHT  11/17/2017  . IR FLUORO GUIDE CV LINE RIGHT  11/22/2017  . IR REMOVAL TUN CV CATH W/O FL  01/26/2018  . IR US GUIDE VASC ACCESS RIGHT  11/17/2017  . MULTIPLE EXTRACTIONS WITH ALVEOLOPLASTY N/A 06/13/2012   Procedure: MULTIPLE EXTRACION WITH ALVEOLOPLASTY EXTRACT: 18, 19, 20, 21, 22, 24, 25, 27, 28, 29, 30, 31;  Surgeon: Gae Bon, DDS;  Location: [place];  Service: Oral Surgery;  Laterality: N/A;  . TUBAL LIGATION    . URETERAL STENT PLACEMENT  09/2017    Allergies  Allergen Reactions  . Aspirin Palpitations    Current Outpatient Medications  Medication Sig Dispense Refill  . acetaminophen (TYLENOL) 325 MG tablet Take 1-2 tablets (325-650 mg total) by mouth every 4 (four) hours as needed for mild pain.    . calcitRIOL (ROCALTROL) 0.25 MCG capsule Take 1  capsule (0.25 mcg total) by mouth Every Tuesday,Thursday,and Saturday with dialysis.    Marland Kitchen carvedilol (COREG) 12.5 MG tablet Take 1 tablet (12.5 mg total) by mouth 2 (two) times daily with a meal.    . Chlorhexidine Gluconate Cloth 2 % PADS Apply 6 each topically daily at 6 (six) AM.    . ferric citrate (AURYXIA) 1 GM 210 MG(Fe) tablet Take 2 tablets (420 mg total) by mouth 3 (three) times daily with meals. 270 tablet   . insulin glargine (LANTUS) 100 UNIT/ML injection Inject 0.12 mLs (12 Units total) into the skin daily. 10 mL 11  . phenytoin (DILANTIN) 125 MG/5ML suspension Take 6 mLs (150 mg total) by mouth 2 (two) times daily. 237 mL 12  . traZODone (DESYREL) 50 MG tablet Take 0.5-1 tablets (25-50 mg total) by mouth at bedtime as  needed for sleep.    . insulin aspart (NOVOLOG) 100 UNIT/ML injection Inject 0-9 Units into the skin 3 (three) times daily with meals. For glucose 121-150 use one unit, for 151-200 use 2 units, for 201 to 250 use 3 units, for 251-300 use 5 units, for 301-350 use 7 units, for 351 or greater use 9 unnits. 8.1 mL 0  . topiramate (TOPAMAX) 25 MG tablet Take 1 tablet (25 mg total) by mouth 2 (two) times daily. 60 tablet 0   No current facility-administered medications for this visit.     Family History  Problem Relation Age of Onset  . Diabetes Sister   . Chronic Renal Failure Neg Hx     Social History   Socioeconomic History  . Marital status: Legally Separated    Spouse name: Not on file  . Number of children: Not on file  . Years of education: Not on file  . Highest education level: Not on file  Occupational History  . Not on file  Social Needs  . Financial resource strain: Not on file  . Food insecurity:    Worry: Not on file    Inability: Not on file  . Transportation needs:    Medical: Not on file    Non-medical: Not on file  Tobacco Use  . Smoking status: Never Smoker  . Smokeless tobacco: Current User    Types: Snuff  Substance and Sexual Activity  . Alcohol use: Yes    Alcohol/week: 1.0 standard drinks    Types: 1 Cans of beer per week    Comment: "sometimes i drink twice a week"  . Drug use: No  . Sexual activity: Not Currently    Comment: last drink 05/19/12 states 2 drinks 3 x's a week  Lifestyle  . Physical activity:    Days per week: Not on file    Minutes per session: Not on file  . Stress: Not on file  Relationships  . Social connections:    Talks on phone: Not on file    Gets together: Not on file    Attends religious service: Not on file    Active member of club or organization: Not on file    Attends meetings of clubs or organizations: Not on file    Relationship status: Not on file  . Intimate partner violence:    Fear of current or ex partner:  Not on file    Emotionally abused: Not on file    Physically abused: Not on file    Forced sexual activity: Not on file  Other Topics Concern  . Not on file  Social History Narrative  .  Not on file     ROS: [x]  Positive   [ ]  Negative   [ ]  All sytems reviewed and are negative  Cardiac: []  chest pain/pressure []  SOB []  DOE  Vascular: []  pain in legs while walking []  pain in feet when lying flat []  hx of DVT []  swelling in legs  Pulmonary: []  asthma []  wheezing  Neurologic: []  weakness in []  arms []  legs []  numbness in []  arms []  legs [] difficulty speaking or slurred speech []  temporary loss of vision in one eye []  dizziness  Hematologic: []  bleeding problems  GI []  GERD  GU: [x]  CKD/renal failure  [x]  HD---[]  M/W/F [x]  T/T/S [x]  hx ureteral stents  [x]  hx UTI  Psychiatric: []  hx of major depression  Integumentary: []  rashes []  ulcers  Constitutional: []  fever []  chills  PHYSICAL EXAMINATION:  Today's Vitals   04/06/18 1449  BP: (!) 162/77  Pulse: 70  Resp: 14  Temp: 97.9 F (36.6 C)  TempSrc: Oral  SpO2: 95%  Weight: 100 lb 10.2 oz (45.7 kg)  Height: 4\' 11"  (1.499 m)   Body mass index is 20.33 kg/m.    General:  WDWN female in NAD Gait: Not observed HENT: WNL Pulmonary: normal non-labored breathing , without Rales, rhonchi,  wheezing Cardiac: regular, without  Murmurs without carotid bruits Abdomen: soft, NT, no masses Skin: without rashes, without ulcers  Vascular Exam/Pulses:   Right Left  Radial 1+ (weak) Unable to palpate   Ulnar Unable to palpate  Unable to palpate    Extremities:  Bilateral hands cold; sensation in tact.  Grip equal bilaterally; the graft is easily palpable; there is a bruit & thrill present Neurologic: A&O X 3; Moving all extremities equally;  Speech is fluent/normal  Non-Invasive Vascular Imaging:   Dialysis duplex 04/06/2018: Elevated velocity at the venous anastomosis   ASSESSMENT/PLAN: 63 y.o.  female with ESRD here for evaluation of her hemodialysis access   -pt is right  handed.  - will plan for fistulogram on a non dialysis day as she does have an elevated velocity at the venous anastomosis.   -pt is not on anticoagulation   Leontine Locket, PA-C [facility] [phone]  Clinic MD:   Oneida Alar

## 2018-04-08 ENCOUNTER — Ambulatory Visit (HOSPITAL_COMMUNITY): Admission: RE | Admit: 2018-04-08 | Payer: Medicaid Other | Source: Home / Self Care | Admitting: Vascular Surgery

## 2018-04-08 ENCOUNTER — Telehealth: Payer: Self-pay | Admitting: *Deleted

## 2018-04-08 ENCOUNTER — Encounter (HOSPITAL_COMMUNITY): Admission: RE | Payer: Self-pay | Source: Home / Self Care

## 2018-04-08 DIAGNOSIS — E119 Type 2 diabetes mellitus without complications: Secondary | ICD-10-CM | POA: Insufficient documentation

## 2018-04-08 SURGERY — A/V FISTULAGRAM
Anesthesia: LOCAL | Laterality: Left

## 2018-04-08 NOTE — Telephone Encounter (Signed)
Spoke with son CALVIN who states patient fell last night is in the ER at [facility] and is being transferred to [facility]. Instructed to call this office for evaluation and treatment of HD access when needed. Verbalized understanding.

## 2018-04-11 DIAGNOSIS — G40909 Epilepsy, unspecified, not intractable, without status epilepticus: Secondary | ICD-10-CM

## 2018-04-11 HISTORY — DX: Epilepsy, unspecified, not intractable, without status epilepticus: G40.909

## 2018-10-18 ENCOUNTER — Emergency Department (HOSPITAL_COMMUNITY): Payer: Medicaid Other

## 2018-10-18 ENCOUNTER — Encounter (HOSPITAL_COMMUNITY): Payer: Self-pay | Admitting: Emergency Medicine

## 2018-10-18 ENCOUNTER — Other Ambulatory Visit: Payer: Self-pay

## 2018-10-18 ENCOUNTER — Emergency Department (HOSPITAL_COMMUNITY)
Admission: EM | Admit: 2018-10-18 | Discharge: 2018-10-18 | Disposition: A | Discharge status: Home / Self Care | Payer: Medicaid Other | Attending: Emergency Medicine | Admitting: Emergency Medicine

## 2018-10-18 DIAGNOSIS — E871 Hypo-osmolality and hyponatremia: Secondary | ICD-10-CM | POA: Diagnosis not present

## 2018-10-18 DIAGNOSIS — Z794 Long term (current) use of insulin: Secondary | ICD-10-CM | POA: Diagnosis not present

## 2018-10-18 DIAGNOSIS — Z992 Dependence on renal dialysis: Secondary | ICD-10-CM | POA: Diagnosis not present

## 2018-10-18 DIAGNOSIS — Z79899 Other long term (current) drug therapy: Secondary | ICD-10-CM | POA: Insufficient documentation

## 2018-10-18 DIAGNOSIS — I132 Hypertensive heart and chronic kidney disease with heart failure and with stage 5 chronic kidney disease, or end stage renal disease: Secondary | ICD-10-CM | POA: Diagnosis not present

## 2018-10-18 DIAGNOSIS — N186 End stage renal disease: Secondary | ICD-10-CM | POA: Diagnosis not present

## 2018-10-18 DIAGNOSIS — I5032 Chronic diastolic (congestive) heart failure: Secondary | ICD-10-CM | POA: Diagnosis not present

## 2018-10-18 DIAGNOSIS — R110 Nausea: Secondary | ICD-10-CM

## 2018-10-18 DIAGNOSIS — F17220 Nicotine dependence, chewing tobacco, uncomplicated: Secondary | ICD-10-CM | POA: Insufficient documentation

## 2018-10-18 DIAGNOSIS — E875 Hyperkalemia: Secondary | ICD-10-CM

## 2018-10-18 DIAGNOSIS — E119 Type 2 diabetes mellitus without complications: Secondary | ICD-10-CM | POA: Diagnosis not present

## 2018-10-18 DIAGNOSIS — Z20828 Contact with and (suspected) exposure to other viral communicable diseases: Secondary | ICD-10-CM | POA: Insufficient documentation

## 2018-10-18 LAB — CBC WITH DIFFERENTIAL/PLATELET
Abs Immature Granulocytes: 0.02 10*3/uL (ref 0.00–0.07)
Basophils Absolute: 0 10*3/uL (ref 0.0–0.1)
Basophils Relative: 0 %
Eosinophils Absolute: 0.3 10*3/uL (ref 0.0–0.5)
Eosinophils Relative: 4 %
HCT: 35.5 % — ABNORMAL LOW (ref 36.0–46.0)
Hemoglobin: 11 g/dL — ABNORMAL LOW (ref 12.0–15.0)
Immature Granulocytes: 0 %
Lymphocytes Relative: 16 %
Lymphs Abs: 1.2 10*3/uL (ref 0.7–4.0)
MCH: 35.1 pg — ABNORMAL HIGH (ref 26.0–34.0)
MCHC: 31 g/dL (ref 30.0–36.0)
MCV: 113.4 fL — ABNORMAL HIGH (ref 80.0–100.0)
Monocytes Absolute: 0.7 10*3/uL (ref 0.1–1.0)
Monocytes Relative: 9 %
Neutro Abs: 5.2 10*3/uL (ref 1.7–7.7)
Neutrophils Relative %: 71 %
Platelets: 253 10*3/uL (ref 150–400)
RBC: 3.13 MIL/uL — ABNORMAL LOW (ref 3.87–5.11)
RDW: 12.5 % (ref 11.5–15.5)
WBC: 7.4 10*3/uL (ref 4.0–10.5)
nRBC: 0 % (ref 0.0–0.2)

## 2018-10-18 LAB — BASIC METABOLIC PANEL
Anion gap: 15 (ref 5–15)
BUN: 59 mg/dL — ABNORMAL HIGH (ref 8–23)
CO2: 24 mmol/L (ref 22–32)
Calcium: 8.8 mg/dL — ABNORMAL LOW (ref 8.9–10.3)
Chloride: 88 mmol/L — ABNORMAL LOW (ref 98–111)
Creatinine, Ser: 5.46 mg/dL — ABNORMAL HIGH (ref 0.44–1.00)
GFR calc Af Amer: 9 mL/min — ABNORMAL LOW (ref >60)
GFR calc non Af Amer: 8 mL/min — ABNORMAL LOW (ref >60)
Glucose, Bld: 260 mg/dL — ABNORMAL HIGH (ref 70–99)
Potassium: 5.6 mmol/L — ABNORMAL HIGH (ref 3.5–5.1)
Sodium: 127 mmol/L — ABNORMAL LOW (ref 135–145)

## 2018-10-18 LAB — CBG MONITORING, ED: Glucose-Capillary: 258 mg/dL — ABNORMAL HIGH (ref 70–99)

## 2018-10-18 LAB — LIPASE, BLOOD: Lipase: 14 U/L (ref 11–51)

## 2018-10-18 LAB — SARS CORONAVIRUS 2 BY RT PCR (HOSPITAL ORDER, PERFORMED IN ~~LOC~~ HOSPITAL LAB): SARS Coronavirus 2: NEGATIVE

## 2018-10-18 MED ORDER — ONDANSETRON 4 MG PO TBDP
4.0000 mg | ORAL_TABLET | Freq: Once | ORAL | Status: AC
  Administered 2018-10-18: 4 mg via ORAL
  Filled 2018-10-18: qty 1

## 2018-10-18 MED ORDER — PROMETHAZINE HCL 12.5 MG PO TABS
25.0000 mg | ORAL_TABLET | ORAL | Status: AC
  Administered 2018-10-18: 25 mg via ORAL
  Filled 2018-10-18: qty 2

## 2018-10-18 MED ORDER — ONDANSETRON 4 MG PO TBDP: 4.0000 mg | ORAL_TABLET | Freq: Three times a day (TID) | ORAL | 0 refills | Status: DC | PRN

## 2018-10-18 NOTE — ED Provider Notes (Signed)
[name] EMERGENCY DEPARTMENT Provider Note   CSN: [id number] Arrival date & time: 10/18/18  [id number]     History   Chief Complaint Chief Complaint  Patient presents with   Nausea    HPI [name] is a 63 y.o. female.     HPI  This patient is a 63 year old female, she presents to [name] today with a complaint of nausea with a feeling of vomiting and diarrhea.  The patient reports that she has currently resided at [facility] facility where there has been known multiple cases of coronavirus.  She has had the occasional cough but is otherwise been feeling well until this morning when she had the feeling of nausea with one episode of vomiting.  When she got to dialysis this morning she was told that she could not continue with dialysis until she had been evaluated for coronavirus.  The patient denies any fevers or chills, she has not had any swelling of the legs and has not felt particularly short of breath.  The patient does report that she has only started having symptoms this morning and has not yet had any diarrhea.  She rarely makes urine, she has had no medications prior to arrival.  The patient was transported here by ambulance from a dialysis facility in [facility] where she gets her daily dialysis.  Past Medical History:  Diagnosis Date   Alcohol-induced pancreatitis    Chronic diarrhea    Depression    Diabetes mellitus    fasting blood sugar [id number]   Diastolic CHF (HCC)    DKA (diabetic ketoacidoses) (HCC)    Gastroparesis    GERD (gastroesophageal reflux disease)    Heart murmur    History of kidney stones    Hyperlipidemia    Hypertension    Hypokalemia    Muscle spasm    Neuropathic pain    Neuropathy    Hx: of   Pyelonephritis    Recurrent pancreatitis    Seizures ([place])    Vitamin B12 deficiency    Vitamin D deficiency     Patient Active Problem List   Diagnosis Date Noted   History of  hydronephrosis --stents in place 12/08/2017   Stroke ([place]) 11/29/2017   Seizures ([place])    Hydronephrosis    Recurrent UTI    Diabetes mellitus type 2 in nonobese (HCC)    ESRD (end stage renal disease) (HCC)    Anemia of chronic disease    Chronic diastolic congestive heart failure ([place])    Essential hypertension    AKI (acute kidney injury) ([place])    PICC (peripherally inserted central catheter) in place    Acute pyelonephritis 11/12/2017   Acute renal failure superimposed on chronic kidney disease ([place]) 11/10/2017   Uncontrolled type 2 diabetes mellitus with hyperglycemia, with long-term current use of insulin ([place]) 11/10/2017   Renal failure 11/08/2017   Seizure disorder ([place]) 11/08/2017   Orthostatic hypotension 07/25/2014   Moderate protein malnutrition (HCC) 09/15/2013   Aspiration pneumonia ([place]) 09/15/2013   Acute respiratory failure requiring reintubation ([place]) 09/11/2013   probable Seizures due to metabolic disorder [id number]   Lactic acidosis 03/19/2013   Abdominal pain 03/19/2013   Rotavirus infection 10/29/2012   Type II or unspecified type diabetes mellitus without mention of complication, uncontrolled 10/29/2012   Protein-calorie malnutrition, severe ([place]) 10/27/2012   NSTEMI (non-ST elevated myocardial infarction) ([place]) 10/26/2012   Fever, unspecified 10/26/2012   Hypotension 10/25/2012   Alcohol withdrawal ([place]) 10/25/2012  Metabolic acidosis [id number]   Chronic diarrhea 10/25/2012   Tobacco abuse 10/25/2012   DKA (diabetic ketoacidoses) ([place]) 09/09/2012   Dehydration 09/09/2012   DKA, type 2 ([place]) 05/20/2012   Abnormal LFTs 05/20/2012   Heart murmur, systolic [id number]   Diabetes mellitus ([place]) 07/08/2011   Hypoglycemia [id number]   Metabolic encephalopathy [id number]   Alcohol intoxication ([place]) 07/08/2011   Alcohol abuse 07/08/2011   Hypokalemia 07/08/2011   Nausea & vomiting 07/08/2011   Abdominal pain  07/08/2011   H/O chronic pancreatitis 07/08/2011    Past Surgical History:  Procedure Laterality Date   AV FISTULA PLACEMENT Left 12/15/2017   Procedure: INSERTION OF ARTERIOVENOUS (AV) GORE-TEX GRAFT ARM;  Surgeon: Rosetta Posner, MD;  Location: [facility] OR;  Service: Vascular;  Laterality: Left;   CATARACT EXTRACTION W/PHACO Right 03/14/2015   Procedure: CATARACT EXTRACTION PHACO AND INTRAOCULAR LENS PLACEMENT ([place]);  Surgeon: Baruch Goldmann, MD;  Location: [facility] ORS;  Service: Ophthalmology;  Laterality: Right;  CDE:11.13   CATARACT EXTRACTION W/PHACO Left 04/11/2015   Procedure: CATARACT EXTRACTION PHACO AND INTRAOCULAR LENS PLACEMENT LEFT EYE CDE=9.68;  Surgeon: Baruch Goldmann, MD;  Location: [facility];  Service: Ophthalmology;  Laterality: Left;   COLONOSCOPY  02/24/2010   cystoscopy with ureteral stent  Bilateral 10/07/2017   At [facility] CV LINE RIGHT  11/17/2017   IR FLUORO GUIDE CV LINE RIGHT  11/22/2017   IR REMOVAL TUN CV CATH W/O FL  01/26/2018   IR US GUIDE VASC ACCESS RIGHT  11/17/2017   MULTIPLE EXTRACTIONS WITH ALVEOLOPLASTY N/A 06/13/2012   Procedure: MULTIPLE EXTRACION WITH ALVEOLOPLASTY EXTRACT: 18, 19, 20, 21, 22, 24, 25, 27, 28, 29, 30, 31;  Surgeon: Gae Bon, DDS;  Location: [facility];  Service: Oral Surgery;  Laterality: N/A;   TUBAL LIGATION     URETERAL STENT PLACEMENT  09/2017     OB History    Gravida      Para      Term      Preterm      AB      Living  2     SAB      TAB      Ectopic      Multiple      Live Births               Home Medications    Prior to Admission medications   Medication Sig Start Date End Date Taking? Authorizing Provider  acetaminophen (TYLENOL) 325 MG tablet Take 1-2 tablets (325-650 mg total) by mouth every 4 (four) hours as needed for mild pain. 12/16/17   Love, Ivan Anchors, PA-C  calcitRIOL (ROCALTROL) 0.25 MCG capsule Take 1 capsule (0.25 mcg total) by mouth Every Tuesday,Thursday,and  Saturday with dialysis. 12/18/17   Love, Ivan Anchors, PA-C  carvedilol (COREG) 12.5 MG tablet Take 1 tablet (12.5 mg total) by mouth 2 (two) times daily with a meal. 12/16/17   Love, Ivan Anchors, PA-C  Chlorhexidine Gluconate Cloth 2 % PADS Apply 6 each topically daily at 6 (six) AM. 12/17/17   Love, Ivan Anchors, PA-C  ferric citrate (AURYXIA) 1 GM 210 MG(Fe) tablet Take 2 tablets (420 mg total) by mouth 3 (three) times daily with meals. 12/16/17   Love, Ivan Anchors, PA-C  insulin aspart (NOVOLOG) 100 UNIT/ML injection Inject 0-9 Units into the skin 3 (three) times daily with meals. For glucose 121-150 use one unit, for 151-200 use 2 units, for 201 to 250  use 3 units, for 251-300 use 5 units, for 301-350 use 7 units, for 351 or greater use 9 unnits. 11/29/17 12/29/17  Arrien, Jimmy Picket, MD  insulin glargine (LANTUS) 100 UNIT/ML injection Inject 0.12 mLs (12 Units total) into the skin daily. 12/17/17   Love, Ivan Anchors, PA-C  ondansetron (ZOFRAN ODT) 4 MG disintegrating tablet Take 1 tablet (4 mg total) by mouth every 8 (eight) hours as needed for nausea. 10/18/18   Noemi [facility], MD  phenytoin (DILANTIN) 125 MG/5ML suspension Take 6 mLs (150 mg total) by mouth 2 (two) times daily. 12/16/17   Love, Ivan Anchors, PA-C  topiramate (TOPAMAX) 25 MG tablet Take 1 tablet (25 mg total) by mouth 2 (two) times daily. 11/29/17 12/29/17  Arrien, Jimmy Picket, MD  traZODone (DESYREL) 50 MG tablet Take 0.5-1 tablets (25-50 mg total) by mouth at bedtime as needed for sleep. 12/16/17   LoveIvan Anchors, PA-C    Family History Family History  Problem Relation Age of Onset   Diabetes Sister    Chronic Renal Failure Neg Hx     Social History Social History   Tobacco Use   Smoking status: Never Smoker   Smokeless tobacco: Current User    Types: Snuff  Substance Use Topics   Alcohol use: Yes    Alcohol/week: 1.0 standard drinks    Types: 1 Cans of beer per week    Comment: "sometimes i drink twice a week"   Drug  use: No     Allergies   Aspirin   Review of Systems Review of Systems  All other systems reviewed and are negative.    Physical Exam Updated Vital Signs BP (!) 156/71 (BP Location: Right Arm)    Pulse 72    Temp 99.1 F (37.3 C) (Rectal)    Resp 18    Ht 1.499 m (4\' 11" )    Wt 43.1 kg    SpO2 98%    BMI 19.19 kg/m   Physical Exam Vitals signs and nursing note reviewed.  Constitutional:      General: She is not in acute distress.    Appearance: She is well-developed.  HENT:     Head: Normocephalic and atraumatic.     Mouth/Throat:     Pharynx: No oropharyngeal exudate.  Eyes:     General: No scleral icterus.       Right eye: No discharge.        Left eye: No discharge.     Conjunctiva/sclera: Conjunctivae normal.     Pupils: Pupils are equal, round, and reactive to light.  Neck:     Musculoskeletal: Normal range of motion and neck supple.     Thyroid: No thyromegaly.     Vascular: No JVD.  Cardiovascular:     Rate and Rhythm: Normal rate and regular rhythm.     Heart sounds: Normal heart sounds. No murmur. No friction rub. No gallop.      Comments: Good thrill in the fistula in the LUE.   Pulmonary:     Effort: Pulmonary effort is normal. No respiratory distress.     Breath sounds: Normal breath sounds. No wheezing or rales.  Abdominal:     General: Bowel sounds are normal. There is no distension.     Palpations: Abdomen is soft. There is no mass.     Tenderness: There is no abdominal tenderness.  Musculoskeletal: Normal range of motion.        General: No tenderness.     Right  lower leg: No edema.     Left lower leg: No edema.  Lymphadenopathy:     Cervical: No cervical adenopathy.  Skin:    General: Skin is warm and dry.     Findings: No erythema or rash.  Neurological:     Mental Status: She is alert.     Coordination: Coordination normal.  Psychiatric:        Behavior: Behavior normal.      ED Treatments / Results  Labs (all labs ordered are  listed, but only abnormal results are displayed) Labs Reviewed  CBC WITH DIFFERENTIAL/PLATELET - Abnormal; Notable for the following components:      Result Value   RBC 3.13 (*)    Hemoglobin 11.0 (*)    HCT 35.5 (*)    MCV 113.4 (*)    MCH 35.1 (*)    All other components within normal limits  BASIC METABOLIC PANEL - Abnormal; Notable for the following components:   Sodium 127 (*)    Potassium 5.6 (*)    Chloride 88 (*)    Glucose, Bld 260 (*)    BUN 59 (*)    Creatinine, Ser 5.46 (*)    Calcium 8.8 (*)    GFR calc non Af Amer 8 (*)    GFR calc Af Amer 9 (*)    All other components within normal limits  CBG MONITORING, ED - Abnormal; Notable for the following components:   Glucose-Capillary 258 (*)    All other components within normal limits  SARS CORONAVIRUS 2 ([name] ORDER, [facility])  LIPASE, BLOOD    EKG EKG Interpretation  Date/Time:  Tuesday October 18 2018 10:44:04 EDT Ventricular Rate:  76 PR Interval:    QRS Duration: 81 QT Interval:  432 QTC Calculation: 486 R Axis:   -6 Text Interpretation:  Sinus rhythm Probable anteroseptal infarct, old Baseline wander in lead(s) II V1 since last tracing no significant change Confirmed by Noemi [facility] [phone]) on 10/18/2018 10:57:35 AM   Radiology Dg Chest Port 1 View  Result Date: 10/18/2018 CLINICAL DATA:  Nausea, vomiting and headache for 1 day. EXAM: PORTABLE CHEST 1 VIEW COMPARISON:  Single-view of the chest 11/09/2017 and 04/07/2018. FINDINGS: There is cardiomegaly and interstitial pulmonary edema. No pneumothorax or pleural effusion. No acute or focal bony abnormality. IMPRESSION: Cardiomegaly and interstitial edema. Electronically Signed   By: Inge Rise M.D.   On: 10/18/2018 10:51    Procedures Procedures (including critical care time)  Medications Ordered in ED Medications  promethazine (PHENERGAN) tablet 25 mg (has no administration in time range)  ondansetron  (ZOFRAN-ODT) disintegrating tablet 4 mg (4 mg Oral Given 10/18/18 1024)     Initial Impression / Assessment and Plan / ED Course  I have reviewed the triage vital signs and the nursing notes.  Pertinent labs & imaging results that were available during my care of the patient were reviewed by me and considered in my medical decision making (see chart for details).  Clinical Course as of Oct 17 1157  Tue Oct 18, 2018  1058 I have personally seen the xray image - no signs of infiltrate, there is expected mild interstitial edema.  I agree with the radiologist.   [BM]  1154 I have reevluated the patient and communicated with her family members - there is no evidence of pathology causing her nasea - EKG and labs reassuring - she has slightly elevated potassium and low Sodium, which will be corrected at dialysis -  will arrange for outpatient dialysis to be done today - pt and family agreeable.    [BM]  1156  Covid negative   [BM]    Clinical Course User Index [BM] Noemi Chapel, MD      This patient has a rather unremarkable exam, she has a blood pressure of 156/71 without any obvious pulmonary or peripheral edema.  At this point it seems reasonable to make sure she is not severely hyperkalemic, check an EKG, make sure that the nausea feeling is not from severe complications of her dialysis and end-stage renal disease.  At this time would likely benefit from labs, rule out coronavirus with nasal swab and a chest x-ray.  Patient agreeable  Final Clinical Impressions(s) / ED Diagnoses   Final diagnoses:  Nauseated  Hyperkalemia  Hyponatremia    ED Discharge Orders         Ordered    ondansetron (ZOFRAN ODT) 4 MG disintegrating tablet  Every 8 hours PRN     10/18/18 1157           Noemi Chapel, MD 10/18/18 1159

## 2018-10-18 NOTE — Discharge Instructions (Signed)
The testing did not show any specific abnormalities other than low sodium and high potassium both of which can be fixed at dialysis Take Zofran every 6 hours as needed for nausea Drink liquids to stay hydrated Emergency room evaluation for severe or worsening symptoms, however your symptoms may last for another couple of days gradually getting better.  You may even have diarrhea with this as well.

## 2018-10-18 NOTE — ED Triage Notes (Signed)
PT c/o nausea and vomiting x1 today with headache prior to dialysis at [facility] in [place] and states has an occasional cough with no fevers.EMS reports pt is from [place] and [employer] can't do her dialysis today or in the future until she has a covid test resulted. PT denies any SOB.

## 2018-11-12 ENCOUNTER — Other Ambulatory Visit: Payer: Self-pay

## 2018-11-12 ENCOUNTER — Inpatient Hospital Stay (HOSPITAL_COMMUNITY)
Admission: EM | Admit: 2018-11-12 | Discharge: 2018-11-16 | DRG: 100 | Disposition: A | Discharge status: Home / Self Care | Payer: Medicaid Other | Source: Other Acute Inpatient Hospital | Attending: Internal Medicine | Admitting: Internal Medicine

## 2018-11-12 ENCOUNTER — Encounter: Payer: Self-pay | Admitting: Internal Medicine

## 2018-11-12 DIAGNOSIS — D631 Anemia in chronic kidney disease: Secondary | ICD-10-CM | POA: Diagnosis present

## 2018-11-12 DIAGNOSIS — E11649 Type 2 diabetes mellitus with hypoglycemia without coma: Secondary | ICD-10-CM | POA: Diagnosis present

## 2018-11-12 DIAGNOSIS — I5032 Chronic diastolic (congestive) heart failure: Secondary | ICD-10-CM | POA: Diagnosis present

## 2018-11-12 DIAGNOSIS — Z833 Family history of diabetes mellitus: Secondary | ICD-10-CM

## 2018-11-12 DIAGNOSIS — I10 Essential (primary) hypertension: Secondary | ICD-10-CM | POA: Diagnosis not present

## 2018-11-12 DIAGNOSIS — Z639 Problem related to primary support group, unspecified: Secondary | ICD-10-CM

## 2018-11-12 DIAGNOSIS — Z9115 Patient's noncompliance with renal dialysis: Secondary | ICD-10-CM

## 2018-11-12 DIAGNOSIS — G40901 Epilepsy, unspecified, not intractable, with status epilepticus: Principal | ICD-10-CM | POA: Diagnosis present

## 2018-11-12 DIAGNOSIS — Z992 Dependence on renal dialysis: Secondary | ICD-10-CM

## 2018-11-12 DIAGNOSIS — E1143 Type 2 diabetes mellitus with diabetic autonomic (poly)neuropathy: Secondary | ICD-10-CM | POA: Diagnosis present

## 2018-11-12 DIAGNOSIS — E8889 Other specified metabolic disorders: Secondary | ICD-10-CM | POA: Diagnosis present

## 2018-11-12 DIAGNOSIS — Z87442 Personal history of urinary calculi: Secondary | ICD-10-CM

## 2018-11-12 DIAGNOSIS — E538 Deficiency of other specified B group vitamins: Secondary | ICD-10-CM | POA: Diagnosis present

## 2018-11-12 DIAGNOSIS — R197 Diarrhea, unspecified: Secondary | ICD-10-CM | POA: Diagnosis not present

## 2018-11-12 DIAGNOSIS — Z886 Allergy status to analgesic agent status: Secondary | ICD-10-CM

## 2018-11-12 DIAGNOSIS — Z9841 Cataract extraction status, right eye: Secondary | ICD-10-CM

## 2018-11-12 DIAGNOSIS — K219 Gastro-esophageal reflux disease without esophagitis: Secondary | ICD-10-CM | POA: Diagnosis present

## 2018-11-12 DIAGNOSIS — Z9842 Cataract extraction status, left eye: Secondary | ICD-10-CM

## 2018-11-12 DIAGNOSIS — R569 Unspecified convulsions: Secondary | ICD-10-CM

## 2018-11-12 DIAGNOSIS — E785 Hyperlipidemia, unspecified: Secondary | ICD-10-CM | POA: Diagnosis present

## 2018-11-12 DIAGNOSIS — Z79899 Other long term (current) drug therapy: Secondary | ICD-10-CM

## 2018-11-12 DIAGNOSIS — N2581 Secondary hyperparathyroidism of renal origin: Secondary | ICD-10-CM | POA: Diagnosis present

## 2018-11-12 DIAGNOSIS — E1122 Type 2 diabetes mellitus with diabetic chronic kidney disease: Secondary | ICD-10-CM | POA: Diagnosis present

## 2018-11-12 DIAGNOSIS — E871 Hypo-osmolality and hyponatremia: Secondary | ICD-10-CM

## 2018-11-12 DIAGNOSIS — N186 End stage renal disease: Secondary | ICD-10-CM | POA: Diagnosis present

## 2018-11-12 DIAGNOSIS — Z20828 Contact with and (suspected) exposure to other viral communicable diseases: Secondary | ICD-10-CM | POA: Diagnosis present

## 2018-11-12 DIAGNOSIS — E119 Type 2 diabetes mellitus without complications: Secondary | ICD-10-CM

## 2018-11-12 DIAGNOSIS — I132 Hypertensive heart and chronic kidney disease with heart failure and with stage 5 chronic kidney disease, or end stage renal disease: Secondary | ICD-10-CM | POA: Diagnosis present

## 2018-11-12 DIAGNOSIS — Z794 Long term (current) use of insulin: Secondary | ICD-10-CM

## 2018-11-12 DIAGNOSIS — Z961 Presence of intraocular lens: Secondary | ICD-10-CM | POA: Diagnosis present

## 2018-11-12 LAB — CBC WITH DIFFERENTIAL/PLATELET
Abs Immature Granulocytes: 0.01 10*3/uL (ref 0.00–0.07)
Basophils Absolute: 0 10*3/uL (ref 0.0–0.1)
Basophils Relative: 0 %
Eosinophils Absolute: 0.2 10*3/uL (ref 0.0–0.5)
Eosinophils Relative: 3 %
HCT: 31.4 % — ABNORMAL LOW (ref 36.0–46.0)
Hemoglobin: 10.6 g/dL — ABNORMAL LOW (ref 12.0–15.0)
Immature Granulocytes: 0 %
Lymphocytes Relative: 16 %
Lymphs Abs: 1.2 10*3/uL (ref 0.7–4.0)
MCH: 34.4 pg — ABNORMAL HIGH (ref 26.0–34.0)
MCHC: 33.8 g/dL (ref 30.0–36.0)
MCV: 101.9 fL — ABNORMAL HIGH (ref 80.0–100.0)
Monocytes Absolute: 1.1 10*3/uL — ABNORMAL HIGH (ref 0.1–1.0)
Monocytes Relative: 14 %
Neutro Abs: 4.9 10*3/uL (ref 1.7–7.7)
Neutrophils Relative %: 67 %
Platelets: 242 10*3/uL (ref 150–400)
RBC: 3.08 MIL/uL — ABNORMAL LOW (ref 3.87–5.11)
RDW: 12.4 % (ref 11.5–15.5)
WBC: 7.5 10*3/uL (ref 4.0–10.5)
nRBC: 0 % (ref 0.0–0.2)

## 2018-11-12 LAB — RENAL FUNCTION PANEL
Albumin: 3.1 g/dL — ABNORMAL LOW (ref 3.5–5.0)
Anion gap: 23 — ABNORMAL HIGH (ref 5–15)
BUN: 100 mg/dL — ABNORMAL HIGH (ref 8–23)
CO2: 16 mmol/L — ABNORMAL LOW (ref 22–32)
Calcium: 6.9 mg/dL — ABNORMAL LOW (ref 8.9–10.3)
Chloride: 85 mmol/L — ABNORMAL LOW (ref 98–111)
Creatinine, Ser: 9.28 mg/dL — ABNORMAL HIGH (ref 0.44–1.00)
GFR calc Af Amer: 5 mL/min — ABNORMAL LOW (ref >60)
GFR calc non Af Amer: 4 mL/min — ABNORMAL LOW (ref >60)
Glucose, Bld: 145 mg/dL — ABNORMAL HIGH (ref 70–99)
Phosphorus: 11.1 mg/dL — ABNORMAL HIGH (ref 2.5–4.6)
Potassium: 5.1 mmol/L (ref 3.5–5.1)
Sodium: 124 mmol/L — ABNORMAL LOW (ref 135–145)

## 2018-11-12 LAB — HEMOGLOBIN A1C
Hgb A1c MFr Bld: 6.6 % — ABNORMAL HIGH (ref 4.8–5.6)
Mean Plasma Glucose: 142.72 mg/dL

## 2018-11-12 LAB — BASIC METABOLIC PANEL
Anion gap: 26 — ABNORMAL HIGH (ref 5–15)
BUN: 100 mg/dL — ABNORMAL HIGH (ref 8–23)
CO2: 17 mmol/L — ABNORMAL LOW (ref 22–32)
Calcium: 7.1 mg/dL — ABNORMAL LOW (ref 8.9–10.3)
Chloride: 82 mmol/L — ABNORMAL LOW (ref 98–111)
Creatinine, Ser: 9.14 mg/dL — ABNORMAL HIGH (ref 0.44–1.00)
GFR calc Af Amer: 5 mL/min — ABNORMAL LOW (ref >60)
GFR calc non Af Amer: 4 mL/min — ABNORMAL LOW (ref >60)
Glucose, Bld: 225 mg/dL — ABNORMAL HIGH (ref 70–99)
Potassium: 6 mmol/L — ABNORMAL HIGH (ref 3.5–5.1)
Sodium: 125 mmol/L — ABNORMAL LOW (ref 135–145)

## 2018-11-12 LAB — I-STAT CHEM 8, ED
BUN: 94 mg/dL — ABNORMAL HIGH (ref 8–23)
Calcium, Ion: 0.78 mmol/L — CL (ref 1.15–1.40)
Chloride: 87 mmol/L — ABNORMAL LOW (ref 98–111)
Creatinine, Ser: 9.3 mg/dL — ABNORMAL HIGH (ref 0.44–1.00)
Glucose, Bld: 221 mg/dL — ABNORMAL HIGH (ref 70–99)
HCT: 37 % (ref 36.0–46.0)
Hemoglobin: 12.6 g/dL (ref 12.0–15.0)
Potassium: 5.6 mmol/L — ABNORMAL HIGH (ref 3.5–5.1)
Sodium: 122 mmol/L — ABNORMAL LOW (ref 135–145)
TCO2: 20 mmol/L — ABNORMAL LOW (ref 22–32)

## 2018-11-12 LAB — PHOSPHORUS: Phosphorus: 10.9 mg/dL — ABNORMAL HIGH (ref 2.5–4.6)

## 2018-11-12 LAB — MAGNESIUM: Magnesium: 2.5 mg/dL — ABNORMAL HIGH (ref 1.7–2.4)

## 2018-11-12 LAB — SARS CORONAVIRUS 2 BY RT PCR (HOSPITAL ORDER, PERFORMED IN ~~LOC~~ HOSPITAL LAB): SARS Coronavirus 2: NEGATIVE

## 2018-11-12 MED ORDER — CALCIUM CARBONATE ANTACID 1250 MG/5ML PO SUSP
500.0000 mg | Freq: Four times a day (QID) | ORAL | Status: DC | PRN
  Filled 2018-11-12: qty 5

## 2018-11-12 MED ORDER — LORAZEPAM 2 MG/ML IJ SOLN
1.0000 mg | INTRAMUSCULAR | Status: DC | PRN
  Administered 2018-11-13: 11:00:00 1 mg via INTRAVENOUS
  Filled 2018-11-12: qty 1

## 2018-11-12 MED ORDER — SODIUM BICARBONATE 650 MG PO TABS
650.0000 mg | ORAL_TABLET | Freq: Four times a day (QID) | ORAL | Status: DC
  Administered 2018-11-12 – 2018-11-16 (×11): 650 mg via ORAL
  Filled 2018-11-12 (×12): qty 1

## 2018-11-12 MED ORDER — ALBUTEROL SULFATE (2.5 MG/3ML) 0.083% IN NEBU: 2.5000 mg | INHALATION_SOLUTION | RESPIRATORY_TRACT | Status: DC | PRN

## 2018-11-12 MED ORDER — CARVEDILOL 3.125 MG PO TABS
3.1250 mg | ORAL_TABLET | Freq: Two times a day (BID) | ORAL | Status: DC
  Administered 2018-11-12 – 2018-11-13 (×3): 3.125 mg via ORAL
  Filled 2018-11-12 (×3): qty 1

## 2018-11-12 MED ORDER — CARVEDILOL 12.5 MG PO TABS: 12.5000 mg | ORAL_TABLET | Freq: Two times a day (BID) | ORAL | Status: DC

## 2018-11-12 MED ORDER — OXYCODONE HCL 5 MG PO TABS: 2.5000 mg | ORAL_TABLET | Freq: Two times a day (BID) | ORAL | Status: DC | PRN

## 2018-11-12 MED ORDER — ONDANSETRON HCL 4 MG PO TABS: 4.0000 mg | ORAL_TABLET | Freq: Four times a day (QID) | ORAL | Status: DC | PRN

## 2018-11-12 MED ORDER — ONDANSETRON HCL 4 MG/2ML IJ SOLN: 4.0000 mg | Freq: Four times a day (QID) | INTRAMUSCULAR | Status: DC | PRN

## 2018-11-12 MED ORDER — FERRIC CITRATE 1 GM 210 MG(FE) PO TABS
420.0000 mg | ORAL_TABLET | Freq: Three times a day (TID) | ORAL | Status: DC
  Administered 2018-11-13 – 2018-11-16 (×9): 420 mg via ORAL
  Filled 2018-11-12 (×10): qty 2

## 2018-11-12 MED ORDER — NEPRO/CARBSTEADY PO LIQD: 237.0000 mL | Freq: Three times a day (TID) | ORAL | Status: DC | PRN

## 2018-11-12 MED ORDER — INSULIN ASPART 100 UNIT/ML ~~LOC~~ SOLN
0.0000 [IU] | Freq: Three times a day (TID) | SUBCUTANEOUS | Status: DC
  Administered 2018-11-13: 2 [IU] via SUBCUTANEOUS
  Administered 2018-11-13: 1 [IU] via SUBCUTANEOUS
  Administered 2018-11-14: 2 [IU] via SUBCUTANEOUS
  Administered 2018-11-14: 1 [IU] via SUBCUTANEOUS

## 2018-11-12 MED ORDER — SORBITOL 70 % SOLN: 30.0000 mL | Status: DC | PRN

## 2018-11-12 MED ORDER — INSULIN GLARGINE 100 UNIT/ML ~~LOC~~ SOLN
12.0000 [IU] | Freq: Every day | SUBCUTANEOUS | Status: DC
  Administered 2018-11-13 – 2018-11-14 (×2): 12 [IU] via SUBCUTANEOUS
  Filled 2018-11-12 (×2): qty 0.12

## 2018-11-12 MED ORDER — FOLIC ACID 1 MG PO TABS
1.0000 mg | ORAL_TABLET | Freq: Every morning | ORAL | Status: DC
  Administered 2018-11-13 – 2018-11-16 (×4): 1 mg via ORAL
  Filled 2018-11-12 (×4): qty 1

## 2018-11-12 MED ORDER — CHLORHEXIDINE GLUCONATE CLOTH 2 % EX PADS: 6.0000 | MEDICATED_PAD | Freq: Every day | CUTANEOUS | Status: DC

## 2018-11-12 MED ORDER — HYDROXYZINE HCL 25 MG PO TABS: 25.0000 mg | ORAL_TABLET | Freq: Three times a day (TID) | ORAL | Status: DC | PRN

## 2018-11-12 MED ORDER — HEPARIN SODIUM (PORCINE) 1000 UNIT/ML DIALYSIS
1000.0000 [IU] | INTRAMUSCULAR | Status: DC | PRN
  Filled 2018-11-12: qty 1

## 2018-11-12 MED ORDER — TOPIRAMATE 25 MG PO TABS
25.0000 mg | ORAL_TABLET | Freq: Two times a day (BID) | ORAL | Status: DC
  Administered 2018-11-12 – 2018-11-16 (×8): 25 mg via ORAL
  Filled 2018-11-12 (×11): qty 1

## 2018-11-12 MED ORDER — CALCIUM ACETATE (PHOS BINDER) 667 MG PO CAPS
1334.0000 mg | ORAL_CAPSULE | Freq: Three times a day (TID) | ORAL | Status: DC
  Administered 2018-11-13 – 2018-11-16 (×9): 1334 mg via ORAL
  Filled 2018-11-12 (×11): qty 2

## 2018-11-12 MED ORDER — LEVETIRACETAM 500 MG PO TABS: 1000.0000 mg | ORAL_TABLET | Freq: Every morning | ORAL | Status: DC

## 2018-11-12 MED ORDER — HEPARIN SODIUM (PORCINE) 5000 UNIT/ML IJ SOLN
5000.0000 [IU] | Freq: Three times a day (TID) | INTRAMUSCULAR | Status: DC
  Administered 2018-11-12 – 2018-11-16 (×9): 5000 [IU] via SUBCUTANEOUS
  Filled 2018-11-12 (×6): qty 1

## 2018-11-12 MED ORDER — ACETAMINOPHEN 325 MG PO TABS: 650.0000 mg | ORAL_TABLET | Freq: Four times a day (QID) | ORAL | Status: DC | PRN

## 2018-11-12 MED ORDER — CALCITRIOL 0.25 MCG PO CAPS
0.2500 ug | ORAL_CAPSULE | ORAL | Status: DC
  Administered 2018-11-15: 0.25 ug via ORAL
  Filled 2018-11-12: qty 1

## 2018-11-12 MED ORDER — LORAZEPAM 2 MG/ML IJ SOLN
1.0000 mg | Freq: Once | INTRAMUSCULAR | Status: AC
  Administered 2018-11-12: 1 mg via INTRAVENOUS
  Filled 2018-11-12: qty 1

## 2018-11-12 MED ORDER — DOCUSATE SODIUM 283 MG RE ENEM
1.0000 | ENEMA | RECTAL | Status: DC | PRN
  Filled 2018-11-12: qty 1

## 2018-11-12 MED ORDER — CAMPHOR-MENTHOL 0.5-0.5 % EX LOTN
1.0000 "application " | TOPICAL_LOTION | Freq: Three times a day (TID) | CUTANEOUS | Status: DC | PRN
  Filled 2018-11-12: qty 222

## 2018-11-12 MED ORDER — ATORVASTATIN CALCIUM 40 MG PO TABS
40.0000 mg | ORAL_TABLET | Freq: Every evening | ORAL | Status: DC
  Administered 2018-11-13 – 2018-11-15 (×2): 40 mg via ORAL
  Filled 2018-11-12 (×2): qty 1

## 2018-11-12 MED ORDER — PHENYTOIN 125 MG/5ML PO SUSP
150.0000 mg | Freq: Two times a day (BID) | ORAL | Status: DC
  Administered 2018-11-12 – 2018-11-16 (×8): 150 mg via ORAL
  Filled 2018-11-12 (×10): qty 8

## 2018-11-12 MED ORDER — ACETAMINOPHEN 650 MG RE SUPP: 650.0000 mg | Freq: Four times a day (QID) | RECTAL | Status: DC | PRN

## 2018-11-12 MED ORDER — LEVETIRACETAM 500 MG PO TABS
1000.0000 mg | ORAL_TABLET | Freq: Every morning | ORAL | Status: DC
  Administered 2018-11-13 – 2018-11-16 (×4): 1000 mg via ORAL
  Filled 2018-11-12 (×5): qty 2

## 2018-11-12 NOTE — H&P (Signed)
History and Physical    Erica [name] [id number] DOB: August 05, 1955 DOA: 11/12/2018  PCP: Scotty Court, DO Consultants:  Sandi Mealy - orthopedics; Fields - vascularWriter - urology Patient coming from: [facility]   Chief Complaint:  seizures  HPI: Erica [name] is a 63 y.o. female with medical history significant of h/o alcohol-induced pancreatitis; seizures; HTN; HLD; DM: ESRD on HD; and diastolic CHF presenting with seizures. The patient reports feeling weak.  She has otherwise limited ability to provide history at this time.  I was called about this patient by Dr. Alfred Levins from [facility].   She presented to [facility] on 9/1 with n/v; she was discharged after an unremarkable evaluation including negative COVID test.  She was admitted at [facility] from 9/11-12 for diarrhea.  She missed 3 episodes of HD recently, decided to go today and had a seizure in the waiting room.  She is not terribly fluid overloaded.  Given a gram of Keppra.  Na++ 120, previously 126, 120 in July.   Given 100 mL 3% saline.  Dr. Jonnie Finner recommends admission for with HD.  They are unable to do any HD at [facility].  She needs Med Tele Obs and since we had no bed and he did not feel comfortable keeping her in the ER since she needs HD, he arranged an ER:ER transfer instead.   ED Course:   Dr. Jonnie Finner will get her on the list to dialyze.  Hyponatremia is probably hypervolemia.  No seizures here, repeat labs done. Na++ up to 125.  Review of Systems: As per HPI; otherwise review of systems reviewed and negative.  Limited by patient's somnolence, weakness   Past Medical History:  Diagnosis Date  . Alcohol-induced pancreatitis   . Chronic diarrhea   . Depression   . Diabetes mellitus    fasting blood sugar 110-120s  . Diastolic CHF ([place])   . DKA (diabetic ketoacidoses) ([place])   . Gastroparesis   . GERD (gastroesophageal reflux disease)   . Heart murmur   . History of kidney stones   . Hyperlipidemia   . Hypertension    . Hypokalemia   . Muscle spasm   . Neuropathic pain   . Neuropathy    Hx: of  . Pyelonephritis   . Seizures ([place])   . Vitamin B12 deficiency   . Vitamin D deficiency     Past Surgical History:  Procedure Laterality Date  . AV FISTULA PLACEMENT Left 12/15/2017   Procedure: INSERTION OF ARTERIOVENOUS (AV) GORE-TEX GRAFT ARM;  Surgeon: Rosetta Posner, MD;  Location: [place];  Service: Vascular;  Laterality: Left;  . CATARACT EXTRACTION W/PHACO Right 03/14/2015   Procedure: CATARACT EXTRACTION PHACO AND INTRAOCULAR LENS PLACEMENT (IOC);  Surgeon: Baruch Goldmann, MD;  Location: [facility];  Service: Ophthalmology;  Laterality: Right;  CDE:11.13  . CATARACT EXTRACTION W/PHACO Left 04/11/2015   Procedure: CATARACT EXTRACTION PHACO AND INTRAOCULAR LENS PLACEMENT LEFT EYE CDE=9.68;  Surgeon: Baruch Goldmann, MD;  Location: [facility];  Service: Ophthalmology;  Laterality: Left;  . COLONOSCOPY  02/24/2010  . cystoscopy with ureteral stent  Bilateral 10/07/2017   At [place] CV LINE RIGHT  11/17/2017  . IR FLUORO GUIDE CV LINE RIGHT  11/22/2017  . IR REMOVAL TUN CV CATH W/O FL  01/26/2018  . IR US GUIDE VASC ACCESS RIGHT  11/17/2017  . MULTIPLE EXTRACTIONS WITH ALVEOLOPLASTY N/A 06/13/2012   Procedure: MULTIPLE EXTRACION WITH ALVEOLOPLASTY EXTRACT: 18, 19, 20, 21,  22, 24, 25, 27, 28, 29, 30, 31;  Surgeon: Gae Bon, DDS;  Location: [place];  Service: Oral Surgery;  Laterality: N/A;  . TUBAL LIGATION    . URETERAL STENT PLACEMENT  09/2017    Social History   Socioeconomic History  . Marital status: Legally Separated    Spouse name: Not on file  . Number of children: Not on file  . Years of education: Not on file  . Highest education level: Not on file  Occupational History  . Not on file  Social Needs  . Financial resource strain: Not on file  . Food insecurity    Worry: Not on file    Inability: Not on file  . Transportation needs    Medical: Not on file     Non-medical: Not on file  Tobacco Use  . Smoking status: Never Smoker  . Smokeless tobacco: Current User    Types: Snuff  Substance and Sexual Activity  . Alcohol use: Yes    Alcohol/week: 1.0 standard drinks    Types: 1 Cans of beer per week    Comment: "sometimes i drink twice a week"  . Drug use: No  . Sexual activity: Not Currently    Comment: last drink 05/19/12 states 2 drinks 3 x's a week  Lifestyle  . Physical activity    Days per week: Not on file    Minutes per session: Not on file  . Stress: Not on file  Relationships  . Social Herbalist on phone: Not on file    Gets together: Not on file    Attends religious service: Not on file    Active member of club or organization: Not on file    Attends meetings of clubs or organizations: Not on file    Relationship status: Not on file  . Intimate partner violence    Fear of current or ex partner: Not on file    Emotionally abused: Not on file    Physically abused: Not on file    Forced sexual activity: Not on file  Other Topics Concern  . Not on file  Social History Narrative  . Not on file    Allergies  Allergen Reactions  . Aspirin Palpitations    Listed on [facility] 11/12/18    Family History  Problem Relation Age of Onset  . Diabetes Sister   . Chronic Renal Failure Neg Hx     Prior to Admission medications   Medication Sig Start Date End Date Taking? Authorizing Provider  acetaminophen (TYLENOL) 325 MG tablet Take 1-2 tablets (325-650 mg total) by mouth every 4 (four) hours as needed for mild pain. 12/16/17   Love, Ivan Anchors, PA-C  calcitRIOL (ROCALTROL) 0.25 MCG capsule Take 1 capsule (0.25 mcg total) by mouth Every Tuesday,Thursday,and Saturday with dialysis. 12/18/17   Love, Ivan Anchors, PA-C  carvedilol (COREG) 12.5 MG tablet Take 1 tablet (12.5 mg total) by mouth 2 (two) times daily with a meal. 12/16/17   Love, Ivan Anchors, PA-C  Chlorhexidine Gluconate Cloth 2 % PADS Apply 6 each topically daily at 6  (six) AM. 12/17/17   Love, Ivan Anchors, PA-C  ferric citrate (AURYXIA) 1 GM 210 MG(Fe) tablet Take 2 tablets (420 mg total) by mouth 3 (three) times daily with meals. 12/16/17   Love, Ivan Anchors, PA-C  insulin aspart (NOVOLOG) 100 UNIT/ML injection Inject 0-9 Units into the skin 3 (three) times daily with meals. For glucose 121-150 use one unit,  for 151-200 use 2 units, for 201 to 250 use 3 units, for 251-300 use 5 units, for 301-350 use 7 units, for 351 or greater use 9 unnits. 11/29/17 12/29/17  Arrien, Jimmy Picket, MD  insulin glargine (LANTUS) 100 UNIT/ML injection Inject 0.12 mLs (12 Units total) into the skin daily. 12/17/17   Love, Ivan Anchors, PA-C  ondansetron (ZOFRAN ODT) 4 MG disintegrating tablet Take 1 tablet (4 mg total) by mouth every 8 (eight) hours as needed for nausea. 10/18/18   Noemi Chapel, MD  phenytoin (DILANTIN) 125 MG/5ML suspension Take 6 mLs (150 mg total) by mouth 2 (two) times daily. 12/16/17   Love, Ivan Anchors, PA-C  topiramate (TOPAMAX) 25 MG tablet Take 1 tablet (25 mg total) by mouth 2 (two) times daily. 11/29/17 12/29/17  Arrien, Jimmy Picket, MD  traZODone (DESYREL) 50 MG tablet Take 0.5-1 tablets (25-50 mg total) by mouth at bedtime as needed for sleep. 12/16/17   Bary Leriche, PA-C    Physical Exam: Vitals:   11/12/18 1430 11/12/18 1600 11/12/18 1630 11/12/18 1700  BP: (!) 186/90 (!) 162/79 (!) 155/82 (!) 162/78  Pulse: 89 69 71 76  Resp: 17 12 20 12   Temp:      TempSrc:      SpO2: 98% 99% 100% 99%     . General:  Appears chronically ill, frail, older than stated age . Eyes:  PERRL, EOMI, normal lids, iris . ENT:  grossly normal hearing, lips & tongue, mmm; edentulous . Neck:  no LAD, masses or thyromegaly . Cardiovascular:  RRR, no m/r/g. No LE edema.  Marland Kitchen Respiratory:   CTA bilaterally with no wheezes/rales/rhonchi.  Normal respiratory effort. . Abdomen:  soft, NT, ND, NABS . Skin:  no rash or induration seen on limited exam . Musculoskeletal:  grossly  normal tone BUE/BLE, good ROM, no bony abnormality . Psychiatric:  Somnolent mood and affect, speech sparse but appropriate, AOx3 (thinks she is at [facility] but that is close enough) . Neurologic:  CN 2-12 grossly intact, moves all extremities in coordinated fashion    Radiological Exams on Admission: No results found.  EKG: Independently reviewed.  NSR with rate 95; no evidence of acute ischemia   Labs on Admission: I have personally reviewed the available labs and imaging studies at the time of the admission.  Pertinent labs:   Na++ 126 K+ 6.0 CO2 17 Glucose 225 BUN 100/Creatinine 9.14/GFR 5 Ionized ca++ 0.78 Phos 10.9 Mag ++ 2.5 COVID negative today and 9/1   Assessment/Plan Principal Problem:   Seizure (HCC) Active Problems:   Chronic diastolic congestive heart failure (HCC)   Essential hypertension   Diabetes mellitus type 2 in nonobese (HCC)   ESRD (end stage renal disease) (HCC)   Seizures -Patient with known seizure disorder presenting with recurrent seizures, likely associated with metabolic d/o in the setting of HD non-compliance (see below) -She takes Dilantin, Topamax, and Keppra and was loaded with a gram of Keppra at [facility] -She is awaiting HD now and is anticipated to have resolution of this issue with HD -Patient placed in observation overnight for further evaluation on telemetry -Seizure precautions -Ativan prn  ESRD with non-compliance with HD -She reports "family problems" that have prevented her from attending her 3 most recent HD sessions -Importance of attending regular HD was stressed -Nephrology prn order set utilized -Nephrology has seen the patient and she will be dialyzed tonight -She may require repeat HD tomorrow, but appears possibly ready for d/c tomorrow either before  or after HD -Continue Bicarb  HTN -Continue Coreg  HLD -Continue Lipitor  DM -She appears to no longer be on oral meds or basal insulin -Will cover with  sensitive-scale SSI for now  Chronic diastolic CHF -Volume management with HD    Note: This patient has been tested and is negative for the novel coronavirus COVID-19.  DVT prophylaxis: Heparin Code Status:  Full - confirmed with patient Family Communication: None present  Disposition Plan:  Home once clinically improved Consults called: Nephrology  Admission status: It is my clinical opinion that referral for OBSERVATION is reasonable and necessary in this patient based on the above information provided. The aforementioned taken together are felt to place the patient at high risk for further clinical deterioration. However it is anticipated that the patient may be medically stable for discharge from the hospital within 24 to 48 hours.    Karmen Bongo MD [facility]   How to contact the [facility] Attending or Consulting provider [place] or covering provider during after hours [place], for this patient?  1. Check the care team in [facility] and look for a) attending/consulting TR[facility] provider listed and b) the [facility] team listed 2. Log into [email or web address] and use [place]'s universal password to access. If you do not have the password, please contact the hospital operator. 3. Locate the [facility] provider you are looking for under [facility] and page to a number that you can be directly reached. 4. If you still have difficulty reaching the provider, please page the [facility] (Director on Call) for the Hospitalists listed on amion for assistance.   11/12/2018, 5:12 PM

## 2018-11-12 NOTE — ED Provider Notes (Signed)
[facility] EMERGENCY DEPARTMENT Provider Note   CSN: [id number] Arrival date & time: 11/12/18  1208     History   Chief Complaint Chief Complaint  Patient presents with  . Needs Dialysis    HPI Erica [name] is a 63 y.o. female.     HPI   80yF sent in transfer form outside ER, [facility]. Presented to them after seizure prior HD session today. Seemed post-ictal on arrival to ED there. Sodium noted to be 120. She has chronic hyponatremia but not to this degree. She was given 100 cc of 3% saline. Also given 1g keppra. She has a listed seizure history on keppra, dilantin, topamax. Listed history of alcohol abuse but patient says she stopped drinking over a year ago. She reports compliance with her medications. Additionally, pt has missed three consecutive HD sessions and last HD was on 11/03/18.  ED physician there discussed with our on-call nephrologist who agree with transfer but requested that hospitalist service be contacted. With bed availability problems, the transfer was made ER-to-ER.   Past Medical History:  Diagnosis Date  . Alcohol-induced pancreatitis   . Chronic diarrhea   . Depression   . Diabetes mellitus    fasting blood sugar 110-120s  . Diastolic CHF ([place])   . DKA (diabetic ketoacidoses) ([place])   . Gastroparesis   . GERD (gastroesophageal reflux disease)   . Heart murmur   . History of kidney stones   . Hyperlipidemia   . Hypertension   . Hypokalemia   . Muscle spasm   . Neuropathic pain   . Neuropathy    Hx: of  . Pyelonephritis   . Seizures ([place])   . Vitamin B12 deficiency   . Vitamin D deficiency     Patient Active Problem List   Diagnosis Date Noted  . History of hydronephrosis --stents in place 12/08/2017  . Stroke ([place]) 11/29/2017  . Seizures ([place])   . Hydronephrosis   . Recurrent UTI   . Diabetes mellitus type 2 in nonobese (HCC)   . ESRD (end stage renal disease) ([place])   . Anemia of chronic disease   .  Chronic diastolic congestive heart failure ([place])   . Essential hypertension   . AKI (acute kidney injury) ([place])   . PICC (peripherally inserted central catheter) in place   . Acute pyelonephritis 11/12/2017  . Acute renal failure superimposed on chronic kidney disease ([place]) 11/10/2017  . Uncontrolled type 2 diabetes mellitus with hyperglycemia, with long-term current use of insulin ([place]) 11/10/2017  . Renal failure 11/08/2017  . Seizure disorder ([place]) 11/08/2017  . Orthostatic hypotension 07/25/2014  . Moderate protein malnutrition ([place]) 09/15/2013  . Aspiration pneumonia ([place]) 09/15/2013  . Acute respiratory failure requiring reintubation ([place]) 09/11/2013  . probable Seizures due to metabolic disorder [id number]  . Lactic acidosis 03/19/2013  . Abdominal pain 03/19/2013  . Rotavirus infection 10/29/2012  . Type II or unspecified type diabetes mellitus without mention of complication, uncontrolled 10/29/2012  . Protein-calorie malnutrition, severe ([place]) 10/27/2012  . NSTEMI (non-ST elevated myocardial infarction) ([place]) 10/26/2012  . Fever, unspecified 10/26/2012  . Hypotension 10/25/2012  . Alcohol withdrawal ([place]) 10/25/2012  . Metabolic acidosis [id number]  . Chronic diarrhea 10/25/2012  . Tobacco abuse 10/25/2012  . DKA (diabetic ketoacidoses) ([place]) 09/09/2012  . Dehydration 09/09/2012  . DKA, type 2 ([place]) 05/20/2012  . Abnormal LFTs 05/20/2012  . Heart murmur, systolic [id number]  . Diabetes mellitus ([place]) 07/08/2011  . Hypoglycemia 07/08/2011  . Metabolic  encephalopathy 07/08/2011  . Alcohol intoxication ([place]) 07/08/2011  . Alcohol abuse 07/08/2011  . Hypokalemia 07/08/2011  . Nausea & vomiting 07/08/2011  . Abdominal pain 07/08/2011  . H/O chronic pancreatitis 07/08/2011    Past Surgical History:  Procedure Laterality Date  . AV FISTULA PLACEMENT Left 12/15/2017   Procedure: INSERTION OF ARTERIOVENOUS (AV) GORE-TEX GRAFT ARM;  Surgeon: Rosetta Posner, MD;  Location:  [facility];  Service: Vascular;  Laterality: Left;  . CATARACT EXTRACTION W/PHACO Right 03/14/2015   Procedure: CATARACT EXTRACTION PHACO AND INTRAOCULAR LENS PLACEMENT (IOC);  Surgeon: Baruch Goldmann, MD;  Location: [facility];  Service: Ophthalmology;  Laterality: Right;  CDE:11.13  . CATARACT EXTRACTION W/PHACO Left 04/11/2015   Procedure: CATARACT EXTRACTION PHACO AND INTRAOCULAR LENS PLACEMENT LEFT EYE CDE=9.68;  Surgeon: Baruch Goldmann, MD;  Location: [facility];  Service: Ophthalmology;  Laterality: Left;  . COLONOSCOPY  02/24/2010  . cystoscopy with ureteral stent  Bilateral 10/07/2017   At [facility] CV LINE RIGHT  11/17/2017  . IR FLUORO GUIDE CV LINE RIGHT  11/22/2017  . IR REMOVAL TUN CV CATH W/O FL  01/26/2018  . IR US GUIDE VASC ACCESS RIGHT  11/17/2017  . MULTIPLE EXTRACTIONS WITH ALVEOLOPLASTY N/A 06/13/2012   Procedure: MULTIPLE EXTRACION WITH ALVEOLOPLASTY EXTRACT: 18, 19, 20, 21, 22, 24, 25, 27, 28, 29, 30, 31;  Surgeon: Gae Bon, DDS;  Location: [facility];  Service: Oral Surgery;  Laterality: N/A;  . TUBAL LIGATION    . URETERAL STENT PLACEMENT  09/2017     OB History    Gravida      Para      Term      Preterm      AB      Living  2     SAB      TAB      Ectopic      Multiple      Live Births               Home Medications    Prior to Admission medications   Medication Sig Start Date End Date Taking? Authorizing Provider  acetaminophen (TYLENOL) 325 MG tablet Take 1-2 tablets (325-650 mg total) by mouth every 4 (four) hours as needed for mild pain. 12/16/17   Love, Ivan Anchors, PA-C  calcitRIOL (ROCALTROL) 0.25 MCG capsule Take 1 capsule (0.25 mcg total) by mouth Every Tuesday,Thursday,and Saturday with dialysis. 12/18/17   Love, Ivan Anchors, PA-C  carvedilol (COREG) 12.5 MG tablet Take 1 tablet (12.5 mg total) by mouth 2 (two) times daily with a meal. 12/16/17   Love, Ivan Anchors, PA-C  Chlorhexidine Gluconate Cloth 2 % PADS Apply 6 each  topically daily at 6 (six) AM. 12/17/17   Love, Ivan Anchors, PA-C  ferric citrate (AURYXIA) 1 GM 210 MG(Fe) tablet Take 2 tablets (420 mg total) by mouth 3 (three) times daily with meals. 12/16/17   Love, Ivan Anchors, PA-C  insulin aspart (NOVOLOG) 100 UNIT/ML injection Inject 0-9 Units into the skin 3 (three) times daily with meals. For glucose 121-150 use one unit, for 151-200 use 2 units, for 201 to 250 use 3 units, for 251-300 use 5 units, for 301-350 use 7 units, for 351 or greater use 9 unnits. 11/29/17 12/29/17  Arrien, Jimmy Picket, MD  insulin glargine (LANTUS) 100 UNIT/ML injection Inject 0.12 mLs (12 Units total) into the skin daily. 12/17/17   Love, Ivan Anchors, PA-C  ondansetron (ZOFRAN ODT)  4 MG disintegrating tablet Take 1 tablet (4 mg total) by mouth every 8 (eight) hours as needed for nausea. 10/18/18   Noemi Chapel, MD  phenytoin (DILANTIN) 125 MG/5ML suspension Take 6 mLs (150 mg total) by mouth 2 (two) times daily. 12/16/17   Love, Ivan Anchors, PA-C  topiramate (TOPAMAX) 25 MG tablet Take 1 tablet (25 mg total) by mouth 2 (two) times daily. 11/29/17 12/29/17  Arrien, Jimmy Picket, MD  traZODone (DESYREL) 50 MG tablet Take 0.5-1 tablets (25-50 mg total) by mouth at bedtime as needed for sleep. 12/16/17   Bary Leriche, PA-C    Family History Family History  Problem Relation Age of Onset  . Diabetes Sister   . Chronic Renal Failure Neg Hx     Social History Social History   Tobacco Use  . Smoking status: Never Smoker  . Smokeless tobacco: Current User    Types: Snuff  Substance Use Topics  . Alcohol use: Yes    Alcohol/week: 1.0 standard drinks    Types: 1 Cans of beer per week    Comment: "sometimes i drink twice a week"  . Drug use: No     Allergies   Aspirin   Review of Systems Review of Systems  All systems reviewed and negative, other than as noted in HPI.  Physical Exam Updated Vital Signs BP (!) 198/86   Pulse 76   Temp (!) 97.4 F (36.3 C) (Oral)    Resp 14   Physical Exam Vitals signs and nursing note reviewed.  Constitutional:      General: She is not in acute distress.    Appearance: She is well-developed.  HENT:     Head: Normocephalic and atraumatic.  Eyes:     General:        Right eye: No discharge.        Left eye: No discharge.     Conjunctiva/sclera: Conjunctivae normal.  Neck:     Musculoskeletal: Neck supple.  Cardiovascular:     Rate and Rhythm: Normal rate. Rhythm irregular.     Heart sounds: Normal heart sounds. No murmur. No friction rub. No gallop.      Comments: Fistula LUE with thrill Pulmonary:     Effort: Pulmonary effort is normal. No respiratory distress.     Breath sounds: Normal breath sounds.  Abdominal:     General: There is no distension.     Palpations: Abdomen is soft.     Tenderness: There is no abdominal tenderness.  Musculoskeletal:        General: No tenderness.  Skin:    General: Skin is warm and dry.  Neurological:     Mental Status: She is alert and oriented to person, place, and time.     Cranial Nerves: No cranial nerve deficit.     Sensory: No sensory deficit.     Motor: No weakness.  Psychiatric:        Behavior: Behavior normal.        Thought Content: Thought content normal.      ED Treatments / Results  Labs (all labs ordered are listed, but only abnormal results are displayed) Labs Reviewed  BASIC METABOLIC PANEL - Abnormal; Notable for the following components:      Result Value   Sodium 125 (*)    Potassium 6.0 (*)    Chloride 82 (*)    CO2 17 (*)    Glucose, Bld 225 (*)    BUN 100 (*)  Creatinine, Ser 9.14 (*)    Calcium 7.1 (*)    GFR calc non Af Amer 4 (*)    GFR calc Af Amer 5 (*)    Anion gap 26 (*)    All other components within normal limits  MAGNESIUM - Abnormal; Notable for the following components:   Magnesium 2.5 (*)    All other components within normal limits  PHOSPHORUS - Abnormal; Notable for the following components:   Phosphorus  10.9 (*)    All other components within normal limits  COMPREHENSIVE METABOLIC PANEL - Abnormal; Notable for the following components:   Sodium 132 (*)    Chloride 92 (*)    Glucose, Bld 137 (*)    BUN 43 (*)    Creatinine, Ser 5.65 (*)    Calcium 7.7 (*)    Total Protein 6.4 (*)    Albumin 2.9 (*)    AST 61 (*)    ALT 57 (*)    Alkaline Phosphatase 638 (*)    GFR calc non Af Amer 7 (*)    GFR calc Af Amer 9 (*)    Anion gap 17 (*)    All other components within normal limits  CBC - Abnormal; Notable for the following components:   RBC 3.20 (*)    Hemoglobin 11.0 (*)    HCT 32.4 (*)    MCV 101.3 (*)    MCH 34.4 (*)    All other components within normal limits  CBC WITH DIFFERENTIAL/PLATELET - Abnormal; Notable for the following components:   RBC 3.08 (*)    Hemoglobin 10.6 (*)    HCT 31.4 (*)    MCV 101.9 (*)    MCH 34.4 (*)    Monocytes Absolute 1.1 (*)    All other components within normal limits  HEMOGLOBIN A1C - Abnormal; Notable for the following components:   Hgb A1c MFr Bld 6.6 (*)    All other components within normal limits  RENAL FUNCTION PANEL - Abnormal; Notable for the following components:   Sodium 124 (*)    Chloride 85 (*)    CO2 16 (*)    Glucose, Bld 145 (*)    BUN 100 (*)    Creatinine, Ser 9.28 (*)    Calcium 6.9 (*)    Phosphorus 11.1 (*)    Albumin 3.1 (*)    GFR calc non Af Amer 4 (*)    GFR calc Af Amer 5 (*)    Anion gap 23 (*)    All other components within normal limits  GLUCOSE, CAPILLARY - Abnormal; Notable for the following components:   Glucose-Capillary 144 (*)    All other components within normal limits  I-STAT CHEM 8, ED - Abnormal; Notable for the following components:   Sodium 122 (*)    Potassium 5.6 (*)    Chloride 87 (*)    BUN 94 (*)    Creatinine, Ser 9.30 (*)    Glucose, Bld 221 (*)    Calcium, Ion 0.78 (*)    TCO2 20 (*)    All other components within normal limits  SARS CORONAVIRUS 2 (HOSPITAL ORDER, [place] [facility])  HEPATITIS B E ANTIGEN  HEPATITIS B CORE ANTIBODY, TOTAL  HEPATITIS PANEL, ACUTE  HIV ANTIBODY (ROUTINE TESTING W REFLEX)    EKG None  Radiology No results found.  Procedures Procedures (including critical care time)  Medications Ordered in ED Medications - No data to display   Initial Impression /  Assessment and Plan / ED Course  I have reviewed the triage vital signs and the nursing notes.  Pertinent labs & imaging results that were available during my care of the patient were reviewed by me and considered in my medical decision making (see chart for details).    62yF with seizure. Hyponatremia chronic, but worse today. She has a seizure history, but this could potentially be contributing. 100 cc of 3% saline prior to arrival. Received 1,000 mg keppra at outside hospital. Currently says she feels "not good" and "shaky" but she is answering questions appropriately. Dr Melvia Heaps, nephrology, aware of patient. Requesting repeat labs. COVID testing. Unassigned medical admission.    Labs from OSH from today significant for hemoglobin 10.6, Na of 120, K of 5.1, CO2 15, BUN 92, Cr 8.99, Ca 7.3.   Final Clinical Impressions(s) / ED Diagnoses   Final diagnoses:  Seizures ([place])  Hyponatremia  ESRD on dialysis [facility])    ED Discharge Orders    None       Virgel Manifold, MD 11/13/18 [phone]

## 2018-11-12 NOTE — ED Notes (Signed)
I Stat Chem 8 Calcium 0.78 reported to Dr. Wilson Singer

## 2018-11-12 NOTE — ED Triage Notes (Signed)
Pt presents from [facility] as an ED transfer. Pt had a tonic clonic seizure this morning prior to having dialysis and was sent to the ED. Pt has not had dialysis in over a week. Pt received 1gm of Keppra and 167mL of 3%NSS at [facility] prior to transfer. Pt hyponatremic at 126 at [facility] ED.   Alert and oriented x4 upon ED arrival. Denying any pain. Complaining of feeling tired.

## 2018-11-12 NOTE — ED Notes (Signed)
Patient transported to HD 

## 2018-11-12 NOTE — Consult Note (Addendum)
[facility] Renal Consultation Note    Indication for Consultation:  Management of ESRD/hemodialysis, anemia, hypertension/volume, and secondary hyperparathyroidism. PCP:  HPI: Erica [name] is a 63 y.o. female with a history of ESRD on dialysis at [facility], [place], CHF, HTN, seizure disorder, and remote alcohol induced pancreatitis, who presented to the ED as a transfer from [facility] for a seizure. Patient reported to her dialysis center today but had a seizure in the lobby prior to treatment and was sent to the ED. She does have a known history of seizures and takes keppra, dilantin, and topamax. Reports compliance with her medications and is not sure when she last had a seizure. She has not had dialysis since 11/03/2018, according to her outpatient unit (cites "family issues" as the reason for missed HD). On presentation to [facility], Na 120. She was given 100 cc of 3% saline and 1g of keppra. Of note, her HD clinic reports she was recently a PUI for COVID due to outbreak at her SNF, but had tested negative and was to return back to her regular HD unit. Notes also show a negative COVID test from 10/18/2018.   Repeat testing in the ED here reveals Na 125, K+ 6.0, BUN 100, Cr 9.14, phos 10.9, Hgb 2.6, glucose 225. COVID 19 not detected. BP is elevated at 179/84. Patient denies any SOB, dyspnea, cough, CP, palpitations, abdominal pain, N/V/D. No headache or vision changes. Reports she feels "shaking and weak," since her seizure today, but otherwise feels fine.  Past Medical History:  Diagnosis Date  . Alcohol-induced pancreatitis   . Chronic diarrhea   . Depression   . Diabetes mellitus    fasting blood sugar 110-120s  . Diastolic CHF ([place])   . DKA (diabetic ketoacidoses) ([place])   . Gastroparesis   . GERD (gastroesophageal reflux disease)   . Heart murmur   . History of kidney stones   . Hyperlipidemia   . Hypertension   . Hypokalemia   . Muscle spasm   . Neuropathic  pain   . Neuropathy    Hx: of  . Pyelonephritis   . Seizures ([place])   . Vitamin B12 deficiency   . Vitamin D deficiency    Past Surgical History:  Procedure Laterality Date  . AV FISTULA PLACEMENT Left 12/15/2017   Procedure: INSERTION OF ARTERIOVENOUS (AV) GORE-TEX GRAFT ARM;  Surgeon: Rosetta Posner, MD;  Location: [facility];  Service: Vascular;  Laterality: Left;  . CATARACT EXTRACTION W/PHACO Right 03/14/2015   Procedure: CATARACT EXTRACTION PHACO AND INTRAOCULAR LENS PLACEMENT (IOC);  Surgeon: Baruch Goldmann, MD;  Location: [facility];  Service: Ophthalmology;  Laterality: Right;  CDE:11.13  . CATARACT EXTRACTION W/PHACO Left 04/11/2015   Procedure: CATARACT EXTRACTION PHACO AND INTRAOCULAR LENS PLACEMENT LEFT EYE CDE=9.68;  Surgeon: Baruch Goldmann, MD;  Location: [facility];  Service: Ophthalmology;  Laterality: Left;  . COLONOSCOPY  02/24/2010  . cystoscopy with ureteral stent  Bilateral 10/07/2017   At [facility] CV LINE RIGHT  11/17/2017  . IR FLUORO GUIDE CV LINE RIGHT  11/22/2017  . IR REMOVAL TUN CV CATH W/O FL  01/26/2018  . IR US GUIDE VASC ACCESS RIGHT  11/17/2017  . MULTIPLE EXTRACTIONS WITH ALVEOLOPLASTY N/A 06/13/2012   Procedure: MULTIPLE EXTRACION WITH ALVEOLOPLASTY EXTRACT: 18, 19, 20, 21, 22, 24, 25, 27, 28, 29, 30, 31;  Surgeon: Gae Bon, DDS;  Location: [facility];  Service: Oral Surgery;  Laterality: N/A;  .  TUBAL LIGATION    . URETERAL STENT PLACEMENT  09/2017   Family History  Problem Relation Age of Onset  . Diabetes Sister   . Chronic Renal Failure Neg Hx    Social History:  reports that she has never smoked. Her smokeless tobacco use includes snuff. She reports current alcohol use of about 1.0 standard drinks of alcohol per week. She reports that she does not use drugs.  ROS: As per HPI otherwise negative.  Physical Exam: Vitals:   11/12/18 1211 11/12/18 1230 11/12/18 1300  BP: (!) 198/86 (!) 177/85 (!) 179/84  Pulse: 76 74 75  Resp: 14 18  14   Temp: (!) 97.4 F (36.3 C)    TempSrc: Oral    SpO2: 98% 99% 97%     General: Well developed, well nourished, in no acute distress. Head: Normocephalic, atraumatic, sclera non-icteric, mucus membranes are moist. Neck:  JVD not elevated. Lungs: Clear bilaterally to auscultation without wheezes, rales, or rhonchi. Breathing is unlabored on RA. Heart: RRR with normal S1, S2. No murmurs, rubs, or gallops appreciated. Abdomen: Soft, non-tender, non-distended with normoactive bowel sounds. No rebound/guarding. No obvious abdominal masses. Lower extremities: No edema or ischemic changes, no open wounds. Neuro: Alert and oriented X 3 but some confusion about PMH. Moves all extremities spontaneously. + tremor at rest Psych:  Responds to questions appropriately with a normal affect. Dialysis Access: LUE AVF + bruit  Allergies  Allergen Reactions  . Aspirin Palpitations    Now denies   Prior to Admission medications   Medication Sig Start Date End Date Taking? Authorizing Provider  acetaminophen (TYLENOL) 325 MG tablet Take 1-2 tablets (325-650 mg total) by mouth every 4 (four) hours as needed for mild pain. 12/16/17   Love, Ivan Anchors, PA-C  calcitRIOL (ROCALTROL) 0.25 MCG capsule Take 1 capsule (0.25 mcg total) by mouth Every Tuesday,Thursday,and Saturday with dialysis. 12/18/17   Love, Ivan Anchors, PA-C  carvedilol (COREG) 12.5 MG tablet Take 1 tablet (12.5 mg total) by mouth 2 (two) times daily with a meal. 12/16/17   Love, Ivan Anchors, PA-C  Chlorhexidine Gluconate Cloth 2 % PADS Apply 6 each topically daily at 6 (six) AM. 12/17/17   Love, Ivan Anchors, PA-C  ferric citrate (AURYXIA) 1 GM 210 MG(Fe) tablet Take 2 tablets (420 mg total) by mouth 3 (three) times daily with meals. 12/16/17   Love, Ivan Anchors, PA-C  insulin aspart (NOVOLOG) 100 UNIT/ML injection Inject 0-9 Units into the skin 3 (three) times daily with meals. For glucose 121-150 use one unit, for 151-200 use 2 units, for 201 to 250 use 3  units, for 251-300 use 5 units, for 301-350 use 7 units, for 351 or greater use 9 unnits. 11/29/17 12/29/17  Arrien, Jimmy Picket, MD  insulin glargine (LANTUS) 100 UNIT/ML injection Inject 0.12 mLs (12 Units total) into the skin daily. 12/17/17   Love, Ivan Anchors, PA-C  ondansetron (ZOFRAN ODT) 4 MG disintegrating tablet Take 1 tablet (4 mg total) by mouth every 8 (eight) hours as needed for nausea. 10/18/18   Noemi Chapel, MD  phenytoin (DILANTIN) 125 MG/5ML suspension Take 6 mLs (150 mg total) by mouth 2 (two) times daily. 12/16/17   Love, Ivan Anchors, PA-C  topiramate (TOPAMAX) 25 MG tablet Take 1 tablet (25 mg total) by mouth 2 (two) times daily. 11/29/17 12/29/17  Arrien, Jimmy Picket, MD  traZODone (DESYREL) 50 MG tablet Take 0.5-1 tablets (25-50 mg total) by mouth at bedtime as needed for sleep. 12/16/17  Bary Leriche, PA-C   Current Facility-Administered Medications  Medication Dose Route Frequency Provider Last Rate Last Dose  . [START ON 11/13/2018] Chlorhexidine Gluconate Cloth 2 % PADS 6 each  6 each Topical Q0600 Janalee Dane, PA-C       Current Outpatient Medications  Medication Sig Dispense Refill  . acetaminophen (TYLENOL) 325 MG tablet Take 1-2 tablets (325-650 mg total) by mouth every 4 (four) hours as needed for mild pain.    . calcitRIOL (ROCALTROL) 0.25 MCG capsule Take 1 capsule (0.25 mcg total) by mouth Every Tuesday,Thursday,and Saturday with dialysis.    Marland Kitchen carvedilol (COREG) 12.5 MG tablet Take 1 tablet (12.5 mg total) by mouth 2 (two) times daily with a meal.    . Chlorhexidine Gluconate Cloth 2 % PADS Apply 6 each topically daily at 6 (six) AM.    . ferric citrate (AURYXIA) 1 GM 210 MG(Fe) tablet Take 2 tablets (420 mg total) by mouth 3 (three) times daily with meals. 270 tablet   . insulin aspart (NOVOLOG) 100 UNIT/ML injection Inject 0-9 Units into the skin 3 (three) times daily with meals. For glucose 121-150 use one unit, for 151-200 use 2 units, for 201 to  250 use 3 units, for 251-300 use 5 units, for 301-350 use 7 units, for 351 or greater use 9 unnits. 8.1 mL 0  . insulin glargine (LANTUS) 100 UNIT/ML injection Inject 0.12 mLs (12 Units total) into the skin daily. 10 mL 11  . ondansetron (ZOFRAN ODT) 4 MG disintegrating tablet Take 1 tablet (4 mg total) by mouth every 8 (eight) hours as needed for nausea. 10 tablet 0  . phenytoin (DILANTIN) 125 MG/5ML suspension Take 6 mLs (150 mg total) by mouth 2 (two) times daily. 237 mL 12  . topiramate (TOPAMAX) 25 MG tablet Take 1 tablet (25 mg total) by mouth 2 (two) times daily. 60 tablet 0  . traZODone (DESYREL) 50 MG tablet Take 0.5-1 tablets (25-50 mg total) by mouth at bedtime as needed for sleep.     Labs: Basic Metabolic Panel: Recent Labs  Lab 11/12/18 1250 11/12/18 1257  NA 122* 125*  K 5.6* 6.0*  CL 87* 82*  CO2  --  17*  GLUCOSE 221* 225*  BUN 94* 100*  CREATININE 9.30* 9.14*  CALCIUM  --  7.1*  PHOS  --  10.9*   CBC: Recent Labs  Lab 11/12/18 1250  HGB 12.6  HCT 37.0    Dialysis Orders: Center: [facility]  on TTS- last 11/03/2018. EDW 43.5kg, Time: 3.75 hours, 2K/ 2.5 Ca, BFR 400, DFR 600,  Dialyzer: Gambro Revacir 200, LUE AVF Heparin 1000 unit load, 500 units/hour (stop 60 min before end of HD) Phoslo 667mg  2 tabs PO TID  Assessment/Plan: 1.  Seizure: Known history of seizure disorder, on dilantin, phenytoin, keppra and topamax as outpatient. Reports med compliance and denies any recent drug/alcohol use. Na 120 on presentation to [facility], given 100 cc of 3% NaCl and repeat Na 125 here. Hyponatremia possibly contributing to breakthrough seizure. Expect improvement in hyponatremia with HD. Further management per primary. And uremia from missed HD sessions also contributing to lethargy and seizures.  2.  ESRD:  TTS [facility], last HD was 11/03/2018. Reports "family issues" causing her to miss dialysis. BUN now 100, K+ 6.0.  Does not appear volume overloaded on exam however she  is hyponatremia. Comfortable on room air. Will plan for urgent HD today with reduced BFR to reduce risk of dialysis disequilibrium syndrome.  3.  Hypertension/volume: Hypertensive and hyponatremic. Comfortable on room air.  UF goal 2.5L with HD today.  4.  Anemia: Hemoglobin 12.6. No indication for ESA at this time.  5.  Metabolic bone disease: Calcium 7.1 but no albumin reported yet- follow. Phos 10.9. Supposed to be taking phoslo 2 tabs AC as an outpatient but doubt compliance based on labs. Resume here, should improve both calcium and phosphorus. Continue calcitriol.  6.  Nutrition:  Albumin pending. Recommend renal diet with fluid restrictions.   Anice Paganini, PA-C 11/12/2018, 2:21 PM  [facility] Pager: [phone]  Pt seen, examined and agree w assess/plan as above with additions as indicated.  [facility] 11/12/2018, 4:42 PM

## 2018-11-13 DIAGNOSIS — R569 Unspecified convulsions: Secondary | ICD-10-CM | POA: Diagnosis not present

## 2018-11-13 DIAGNOSIS — E785 Hyperlipidemia, unspecified: Secondary | ICD-10-CM | POA: Diagnosis present

## 2018-11-13 DIAGNOSIS — E871 Hypo-osmolality and hyponatremia: Secondary | ICD-10-CM | POA: Diagnosis present

## 2018-11-13 DIAGNOSIS — K219 Gastro-esophageal reflux disease without esophagitis: Secondary | ICD-10-CM | POA: Diagnosis present

## 2018-11-13 DIAGNOSIS — G40901 Epilepsy, unspecified, not intractable, with status epilepticus: Secondary | ICD-10-CM | POA: Diagnosis present

## 2018-11-13 DIAGNOSIS — Z833 Family history of diabetes mellitus: Secondary | ICD-10-CM | POA: Diagnosis not present

## 2018-11-13 DIAGNOSIS — Z87442 Personal history of urinary calculi: Secondary | ICD-10-CM | POA: Diagnosis not present

## 2018-11-13 DIAGNOSIS — N186 End stage renal disease: Secondary | ICD-10-CM | POA: Diagnosis present

## 2018-11-13 DIAGNOSIS — Z992 Dependence on renal dialysis: Secondary | ICD-10-CM | POA: Diagnosis not present

## 2018-11-13 DIAGNOSIS — Z794 Long term (current) use of insulin: Secondary | ICD-10-CM | POA: Diagnosis not present

## 2018-11-13 DIAGNOSIS — I132 Hypertensive heart and chronic kidney disease with heart failure and with stage 5 chronic kidney disease, or end stage renal disease: Secondary | ICD-10-CM | POA: Diagnosis present

## 2018-11-13 DIAGNOSIS — I5032 Chronic diastolic (congestive) heart failure: Secondary | ICD-10-CM | POA: Diagnosis present

## 2018-11-13 DIAGNOSIS — Z886 Allergy status to analgesic agent status: Secondary | ICD-10-CM | POA: Diagnosis not present

## 2018-11-13 DIAGNOSIS — I10 Essential (primary) hypertension: Secondary | ICD-10-CM | POA: Diagnosis not present

## 2018-11-13 DIAGNOSIS — E1143 Type 2 diabetes mellitus with diabetic autonomic (poly)neuropathy: Secondary | ICD-10-CM | POA: Diagnosis present

## 2018-11-13 DIAGNOSIS — Z961 Presence of intraocular lens: Secondary | ICD-10-CM | POA: Diagnosis present

## 2018-11-13 DIAGNOSIS — Z20828 Contact with and (suspected) exposure to other viral communicable diseases: Secondary | ICD-10-CM | POA: Diagnosis present

## 2018-11-13 DIAGNOSIS — Z79899 Other long term (current) drug therapy: Secondary | ICD-10-CM | POA: Diagnosis not present

## 2018-11-13 DIAGNOSIS — Z9115 Patient's noncompliance with renal dialysis: Secondary | ICD-10-CM | POA: Diagnosis not present

## 2018-11-13 DIAGNOSIS — Z9841 Cataract extraction status, right eye: Secondary | ICD-10-CM | POA: Diagnosis not present

## 2018-11-13 DIAGNOSIS — E119 Type 2 diabetes mellitus without complications: Secondary | ICD-10-CM | POA: Diagnosis not present

## 2018-11-13 DIAGNOSIS — Z9842 Cataract extraction status, left eye: Secondary | ICD-10-CM | POA: Diagnosis not present

## 2018-11-13 DIAGNOSIS — E11649 Type 2 diabetes mellitus with hypoglycemia without coma: Secondary | ICD-10-CM | POA: Diagnosis present

## 2018-11-13 DIAGNOSIS — E1122 Type 2 diabetes mellitus with diabetic chronic kidney disease: Secondary | ICD-10-CM | POA: Diagnosis present

## 2018-11-13 DIAGNOSIS — N2581 Secondary hyperparathyroidism of renal origin: Secondary | ICD-10-CM | POA: Diagnosis present

## 2018-11-13 DIAGNOSIS — Z639 Problem related to primary support group, unspecified: Secondary | ICD-10-CM | POA: Diagnosis not present

## 2018-11-13 LAB — CBC
HCT: 32.4 % — ABNORMAL LOW (ref 36.0–46.0)
Hemoglobin: 11 g/dL — ABNORMAL LOW (ref 12.0–15.0)
MCH: 34.4 pg — ABNORMAL HIGH (ref 26.0–34.0)
MCHC: 34 g/dL (ref 30.0–36.0)
MCV: 101.3 fL — ABNORMAL HIGH (ref 80.0–100.0)
Platelets: 226 10*3/uL (ref 150–400)
RBC: 3.2 MIL/uL — ABNORMAL LOW (ref 3.87–5.11)
RDW: 12.3 % (ref 11.5–15.5)
WBC: 8.5 10*3/uL (ref 4.0–10.5)
nRBC: 0 % (ref 0.0–0.2)

## 2018-11-13 LAB — COMPREHENSIVE METABOLIC PANEL
ALT: 57 U/L — ABNORMAL HIGH (ref 0–44)
AST: 61 U/L — ABNORMAL HIGH (ref 15–41)
Albumin: 2.9 g/dL — ABNORMAL LOW (ref 3.5–5.0)
Alkaline Phosphatase: 638 U/L — ABNORMAL HIGH (ref 38–126)
Anion gap: 17 — ABNORMAL HIGH (ref 5–15)
BUN: 43 mg/dL — ABNORMAL HIGH (ref 8–23)
CO2: 23 mmol/L (ref 22–32)
Calcium: 7.7 mg/dL — ABNORMAL LOW (ref 8.9–10.3)
Chloride: 92 mmol/L — ABNORMAL LOW (ref 98–111)
Creatinine, Ser: 5.65 mg/dL — ABNORMAL HIGH (ref 0.44–1.00)
GFR calc Af Amer: 9 mL/min — ABNORMAL LOW (ref >60)
GFR calc non Af Amer: 7 mL/min — ABNORMAL LOW (ref >60)
Glucose, Bld: 137 mg/dL — ABNORMAL HIGH (ref 70–99)
Potassium: 3.7 mmol/L (ref 3.5–5.1)
Sodium: 132 mmol/L — ABNORMAL LOW (ref 135–145)
Total Bilirubin: 0.9 mg/dL (ref 0.3–1.2)
Total Protein: 6.4 g/dL — ABNORMAL LOW (ref 6.5–8.1)

## 2018-11-13 LAB — PHENYTOIN LEVEL, TOTAL: Phenytoin Lvl: 6.6 ug/mL — ABNORMAL LOW (ref 10.0–20.0)

## 2018-11-13 LAB — HEPATITIS PANEL, ACUTE
HCV Ab: 0.1 s/co ratio (ref 0.0–0.9)
Hep A IgM: NEGATIVE
Hep B C IgM: NEGATIVE
Hepatitis B Surface Ag: NEGATIVE

## 2018-11-13 LAB — HEPATITIS B E ANTIGEN: Hep B E Ag: NEGATIVE

## 2018-11-13 LAB — GLUCOSE, CAPILLARY
Glucose-Capillary: 144 mg/dL — ABNORMAL HIGH (ref 70–99)
Glucose-Capillary: 197 mg/dL — ABNORMAL HIGH (ref 70–99)
Glucose-Capillary: 101 mg/dL — ABNORMAL HIGH (ref 70–99)
Glucose-Capillary: 229 mg/dL — ABNORMAL HIGH (ref 70–99)

## 2018-11-13 LAB — HEPATITIS B CORE ANTIBODY, TOTAL: Hep B Core Total Ab: NEGATIVE

## 2018-11-13 LAB — HIV ANTIBODY (ROUTINE TESTING W REFLEX): HIV Screen 4th Generation wRfx: NONREACTIVE

## 2018-11-13 MED ORDER — LEVETIRACETAM 500 MG PO TABS: 500.0000 mg | ORAL_TABLET | ORAL | Status: DC | PRN

## 2018-11-13 MED ORDER — CHLORHEXIDINE GLUCONATE CLOTH 2 % EX PADS: 6.0000 | MEDICATED_PAD | Freq: Every day | CUTANEOUS | Status: DC

## 2018-11-13 MED ORDER — HYDRALAZINE HCL 20 MG/ML IJ SOLN: 5.0000 mg | Freq: Three times a day (TID) | INTRAMUSCULAR | Status: DC | PRN

## 2018-11-13 MED ORDER — LEVETIRACETAM 500 MG PO TABS
500.0000 mg | ORAL_TABLET | ORAL | Status: DC
  Filled 2018-11-13: qty 1

## 2018-11-13 NOTE — Progress Notes (Signed)
New Admission Note:   Arrival Method: Bed  Mental Orientation:  Alert and Oriented  Telemetry: Box 09  Assessment: Completed Skin: intact old scare from hip surgery on right hip  IV: Right forearm  Pain: 0/10 Tubes: none  Safety Measures: Safety Fall Prevention Plan has been given, discussed and signed Admission: Completed [facility] Orientation: Patient has been orientated to the room, unit and staff.  Family: none   Orders have been reviewed and implemented. Will continue to monitor the patient. Call light has been placed within reach and bed alarm has been activated.   Deo Mehringer RN [facility] Phone: [phone]

## 2018-11-13 NOTE — Plan of Care (Signed)
  Problem: Activity: Goal: Risk for activity intolerance will decrease Outcome: Progressing   

## 2018-11-13 NOTE — Progress Notes (Signed)
Report given to Darylene Price, Therapist, sports. Patient transported to 5M07. All belongings sent with patient.   [place], RN

## 2018-11-13 NOTE — Progress Notes (Addendum)
[facility] Progress Note   Subjective:   Pt seen in room during AM rounds. Had dialysis last night, reports she is feeling much better. Tremor/jerking movements have resolved. Reports stable neuropathy in fingers, not related to dialysis. Denies SOB, dyspnea, CP, cough, abdominal pain. Reports 1 episode of diarrhea last night but now resolved.  Reports itching, explained this is likely due to missed dialysis and uncontrolled phosphorus.   Objective Vitals:   11/13/18 1054 11/13/18 1116 11/13/18 1400 11/13/18 1503  BP: (!) 180/70 (!) 174/82  (!) 159/85  Pulse: 79 92  89  Resp: 16 18  18   Temp:  98.4 F (36.9 C)  98.6 F (37 C)  TempSrc:  Oral  Oral  SpO2: 100% 100%  99%  Weight:   47.4 kg   Height:   4\' 11"  (1.499 m)    Physical Exam General: Well developed, frail appearing female in NAD Heart: RRR, no murmurs, rubs or gallops auscultated Lungs: Inspiratory crackles RLL. No wheezing auscultated. Unlabored on RA Abdomen: Soft, non-tender, non-distended. +BS Extremities: No peripheral edema Dialysis Access: LUE AVF  + bruit  Additional Objective Labs: Basic Metabolic Panel: Recent Labs  Lab 11/12/18 1257 11/12/18 1841 11/13/18 0401  NA 125* 124* 132*  K 6.0* 5.1 3.7  CL 82* 85* 92*  CO2 17* 16* 23  GLUCOSE 225* 145* 137*  BUN 100* 100* 43*  CREATININE 9.14* 9.28* 5.65*  CALCIUM 7.1* 6.9* 7.7*  PHOS 10.9* 11.1*  --    Liver Function Tests: Recent Labs  Lab 11/12/18 1841 11/13/18 0401  AST  --  61*  ALT  --  57*  ALKPHOS  --  638*  BILITOT  --  0.9  PROT  --  6.4*  ALBUMIN 3.1* 2.9*   No results for input(s): LIPASE, AMYLASE in the last 168 hours. CBC: Recent Labs  Lab 11/12/18 1250 11/12/18 1841 11/13/18 0401  WBC  --  7.5 8.5  NEUTROABS  --  4.9  --   HGB 12.6 10.6* 11.0*  HCT 37.0 31.4* 32.4*  MCV  --  101.9* 101.3*  PLT  --  242 226   Blood Culture    Component Value Date/Time   SDES URINE, CATHETERIZED 12/10/2017 0849   SPECREQUEST NONE 12/10/2017 0849   CULT  12/10/2017 0849    NO GROWTH Performed at [facility], [place] [address] [place], [place] [address]    REPTSTATUS 12/11/2017 FINAL 12/10/2017 0849    Cardiac Enzymes: No results for input(s): CKTOTAL, CKMB, CKMBINDEX, TROPONINI in the last 168 hours. CBG: Recent Labs  Lab 11/13/18 0541 11/13/18 1106  GLUCAP 144* 197*   Iron Studies: No results for input(s): IRON, TIBC, TRANSFERRIN, FERRITIN in the last 72 hours. @lablastinr3 @ Studies/Results: No results found. Medications:  . atorvastatin  40 mg Oral QPM  . [START ON 11/15/2018] calcitRIOL  0.25 mcg Oral Q T,Th,Sa-HD  . calcium acetate  1,334 mg Oral TID WC  . carvedilol  3.125 mg Oral BID  . ferric citrate  420 mg Oral TID WC  . folic acid  1 mg Oral q morning - 10a  . heparin  5,000 Units Subcutaneous Q8H  . insulin aspart  0-9 Units Subcutaneous TID WC  . insulin glargine  12 Units Subcutaneous Daily  . levETIRAcetam  1,000 mg Oral q morning - 10a  . [START ON 11/15/2018] levETIRAcetam  500 mg Oral Q T,Th,Sa-HD  . phenytoin  150 mg Oral BID  . sodium bicarbonate  650  mg Oral QID  . topiramate  25 mg Oral BID    Dialysis Orders: Center: [facility]  on TTS- last 11/03/2018. EDW 43.5kg, Time: 3.75 hours, 2K/ 2.5 Ca, BFR 400, DFR 600,  Dialyzer: Gambro Revacir 200, LUE AVF Heparin 1000 unit load, 500 units/hour (stop 60 min before end of HD) Phoslo 667mg  2 tabs PO TID  Assessment/Plan: 1. 1Seizure: Known history of seizure disorder, on dilantin, keppra and topamax as outpatient. Reports med compliance and denies any recent drug/alcohol use. Na 120 on presentation to [facility], given 100 cc of 3% NaCl and repeat Na 125 on 9/26. Hyponatremia/uremia likely contributed to seizures/ lethargy. Improved this AM to 132 after dialysis. Uremia from missed HD may be a big contributor to seizure activity. Patient looks much better today after HD yesterday. Still having some myoclonic  jerking. Plan extra HD tomorrow then again Tuesday to get back on schedule. Counseled pt about not missing HD sessions.   2.  ESRD:  TTS [facility], last HD was 11/03/2018. Reports "family issues" causing her to miss dialysis. On presentation, BUN 100, K+ 6.0.  Had HD last night, Na improved to 132, BUN 43, K+ 3.7. Will plan for additional abbreviated dialysis tomorrow for volume, then resume TTS schedule.  3.  Hypertension/volume: Net UF 2.5L yesterday. BP remains elevated. Still with some crackles on exam. Will plan for extra abbreviated treatment tomorrow.  4.  Anemia: Hemoglobin 11.0.  No indication for ESA at this time.  5.  Metabolic bone disease: Calcium 7.7, 8.6 corrected. Phos 10.9. Supposed to be taking phoslo 2 tabs AC as an outpatient but doubt compliance based on labs. Resume here. 6. Nutrition:  Recommend renal diet with fluid restrictions.   Anice Paganini, PA-C 11/13/2018, 3:13 PM  [facility] Pager: [phone]  Pt seen, examined and agree w A/P as above.  Kelly Splinter  MD 11/13/2018, 3:50 PM

## 2018-11-13 NOTE — Progress Notes (Addendum)
Progress Note    Erica [name]  [id number] DOB: Feb 08, 1956  DOA: 11/12/2018 PCP: Scotty Court, DO    Brief Narrative:    Medical records reviewed and are as summarized below:  Erica [name] is an 63 y.o. female with medical history significant of h/o alcohol-induced pancreatitis; seizures; HTN; HLD; DM: ESRD on HD; and diastolic CHF presenting with seizures. The patient reports feeling weak.  She missed 3 episodes of HD recently, decided to go today and had a seizure in the waiting room.   Assessment/Plan:   Principal Problem:   Seizure ([place]) Active Problems:   Chronic diastolic congestive heart failure (HCC)   Essential hypertension   Diabetes mellitus type 2 in nonobese (HCC)   ESRD (end stage renal disease) (HCC)   Seizures -Patient with known seizure disorder presenting with recurrent seizures, likely associated with metabolic d/o in the setting of HD non-compliance (see below) -She takes Dilantin, Topamax, and Keppra (once daily?) and was loaded with a gram of Keppra at [facility] -s/p HD -had 2 "spells" this AM -was awake the entire time with no confusion-- did have increase in BP and HR -neurology consult -EEG pending  ESRD with non-compliance with HD -She reports "family problems" that have prevented her from attending her 3 most recent HD sessions -Nephrology has seen the patient and she will be dialyzed 9/26 and again on 9/28 -Continue Bicarb  Hyponatremia -improved with fluid removal  Low calcium -defer to renal  HTN -Continue Coreg  HLD -Continue Lipitor  DM -She appears to no longer be on oral meds or basal insulin -Will cover with sensitive-scale SSI for now  Chronic diastolic CHF -Volume management with HD    Family Communication/Anticipated D/C date and plan/Code Status   DVT prophylaxis: heparin Code Status: Full Code.  Family Communication:  Disposition Plan: need EEG, HD tomorrow and neurology evaluation- as she  needs to remain in the hospital for continued seizure like activity will change to inpatient   Medical Consultants:    Renal  neuro  Subjective:   Patient had 2 spells this AM- described by nursing as left hand tremor, b/l leg movements and facial twitching -both resolved by the time I arrived at bedside  Objective:    Vitals:   11/13/18 0959 11/13/18 1004 11/13/18 1010 11/13/18 1038  BP: (!) 220/110 (!) 209/88 (!) 200/83 (!) 190/79  Pulse: (!) 103 90 88 82  Resp: 18     Temp:      TempSrc:      SpO2: 98% 99% 100% 100%    Intake/Output Summary (Last 24 hours) at 11/13/2018 1039 Last data filed at 11/13/2018 0854 Gross per 24 hour  Intake 360 ml  Output 2500 ml  Net -2140 ml   Filed Weights    Exam: Lay in bed Alert and oriented Much older than stated age  Data Reviewed:   I have personally reviewed following labs and imaging studies:  Labs: Labs show the following:   Basic Metabolic Panel: Recent Labs  Lab 11/12/18 1250 11/12/18 1257 11/12/18 1841 11/13/18 0401  NA 122* 125* 124* 132*  K 5.6* 6.0* 5.1 3.7  CL 87* 82* 85* 92*  CO2  --  17* 16* 23  GLUCOSE 221* 225* 145* 137*  BUN 94* 100* 100* 43*  CREATININE 9.30* 9.14* 9.28* 5.65*  CALCIUM  --  7.1* 6.9* 7.7*  MG  --  2.5*  --   --   PHOS  --  10.9*  11.1*  --    GFR CrCl cannot be calculated (Unknown ideal weight.). Liver Function Tests: Recent Labs  Lab 11/12/18 1841 11/13/18 0401  AST  --  61*  ALT  --  57*  ALKPHOS  --  638*  BILITOT  --  0.9  PROT  --  6.4*  ALBUMIN 3.1* 2.9*   No results for input(s): LIPASE, AMYLASE in the last 168 hours. No results for input(s): AMMONIA in the last 168 hours. Coagulation profile No results for input(s): INR, PROTIME in the last 168 hours.  CBC: Recent Labs  Lab 11/12/18 1250 11/12/18 1841 11/13/18 0401  WBC  --  7.5 8.5  NEUTROABS  --  4.9  --   HGB 12.6 10.6* 11.0*  HCT 37.0 31.4* 32.4*  MCV  --  101.9* 101.3*  PLT  --  242 226    Cardiac Enzymes: No results for input(s): CKTOTAL, CKMB, CKMBINDEX, TROPONINI in the last 168 hours. BNP (last 3 results) No results for input(s): PROBNP in the last 8760 hours. CBG: Recent Labs  Lab 11/13/18 0541  GLUCAP 144*   D-Dimer: No results for input(s): DDIMER in the last 72 hours. Hgb A1c: Recent Labs    11/12/18 1841  HGBA1C 6.6*   Lipid Profile: No results for input(s): CHOL, HDL, LDLCALC, TRIG, CHOLHDL, LDLDIRECT in the last 72 hours. Thyroid function studies: No results for input(s): TSH, T4TOTAL, T3FREE, THYROIDAB in the last 72 hours.  Invalid input(s): FREET3 Anemia work up: No results for input(s): VITAMINB12, FOLATE, FERRITIN, TIBC, IRON, RETICCTPCT in the last 72 hours. Sepsis Labs: Recent Labs  Lab 11/12/18 1841 11/13/18 0401  WBC 7.5 8.5    Microbiology Recent Results (from the past 240 hour(s))  SARS Coronavirus 2 [facility] order, Performed in [facility] hospital lab) Nasopharyngeal Nasopharyngeal Swab     Status: None   Collection Time: 11/12/18  1:02 PM   Specimen: Nasopharyngeal Swab  Result Value Ref Range Status   SARS Coronavirus 2 NEGATIVE NEGATIVE Final    Comment: (NOTE) If result is NEGATIVE SARS-CoV-2 target nucleic acids are NOT DETECTED. The SARS-CoV-2 RNA is generally detectable in upper and lower  respiratory specimens during the acute phase of infection. The lowest  concentration of SARS-CoV-2 viral copies this assay can detect is 250  copies / mL. A negative result does not preclude SARS-CoV-2 infection  and should not be used as the sole basis for treatment or other  patient management decisions.  A negative result may occur with  improper specimen collection / handling, submission of specimen other  than nasopharyngeal swab, presence of viral mutation(s) within the  areas targeted by this assay, and inadequate number of viral copies  (<250 copies / mL). A negative result must be combined with clinical  observations,  patient history, and epidemiological information. If result is POSITIVE SARS-CoV-2 target nucleic acids are DETECTED. The SARS-CoV-2 RNA is generally detectable in upper and lower  respiratory specimens dur ing the acute phase of infection.  Positive  results are indicative of active infection with SARS-CoV-2.  Clinical  correlation with patient history and other diagnostic information is  necessary to determine patient infection status.  Positive results do  not rule out bacterial infection or co-infection with other viruses. If result is PRESUMPTIVE POSTIVE SARS-CoV-2 nucleic acids MAY BE PRESENT.   A presumptive positive result was obtained on the submitted specimen  and confirmed on repeat testing.  While 2019 novel coronavirus  (SARS-CoV-2) nucleic acids may be present in  the submitted sample  additional confirmatory testing may be necessary for epidemiological  and / or clinical management purposes  to differentiate between  SARS-CoV-2 and other Sarbecovirus currently known to infect humans.  If clinically indicated additional testing with an alternate test  methodology [phone]) is advised. The SARS-CoV-2 RNA is generally  detectable in upper and lower respiratory sp ecimens during the acute  phase of infection. The expected result is Negative. Fact Sheet for Patients:  StrictlyIdeas.no Fact Sheet for Healthcare Providers: BankingDealers.co.za This test is not yet approved or cleared by the [place] FDA and has been authorized for detection and/or diagnosis of SARS-CoV-2 by FDA under an Emergency Use Authorization (EUA).  This EUA will remain in effect (meaning this test can be used) for the duration of the COVID-19 declaration under Section 564(b)(1) of the Act, 21 U.S.C. section 360bbb-3(b)(1), unless the authorization is terminated or revoked sooner. Performed at [facility], [place] [address], [place], [place]  [id number]     Procedures and diagnostic studies:  No results found.  Medications:   . atorvastatin  40 mg Oral QPM  . [START ON 11/15/2018] calcitRIOL  0.25 mcg Oral Q T,Th,Sa-HD  . calcium acetate  1,334 mg Oral TID WC  . carvedilol  3.125 mg Oral BID  . ferric citrate  420 mg Oral TID WC  . folic acid  1 mg Oral q morning - 10a  . heparin  5,000 Units Subcutaneous Q8H  . insulin aspart  0-9 Units Subcutaneous TID WC  . insulin glargine  12 Units Subcutaneous Daily  . levETIRAcetam  1,000 mg Oral q morning - 10a  . phenytoin  150 mg Oral BID  . sodium bicarbonate  650 mg Oral QID  . topiramate  25 mg Oral BID   Continuous Infusions:   LOS: 0 days   Geradine Girt  [facility]   How to contact the [facility] Attending or Consulting provider [place] or covering provider during after hours [place], for this patient?  1. Check the care team in [facility] and look for a) attending/consulting [facility] provider listed and b) the [facility] team listed 2. Log into [email or web address] and use [place]'s universal password to access. If you do not have the password, please contact the hospital operator. 3. Locate the [facility] provider you are looking for under [facility] and page to a number that you can be directly reached. 4. If you still have difficulty reaching the provider, please page the [facility] (Director on Call) for the Hospitalists listed on amion for assistance.  11/13/2018, 10:39 AM

## 2018-11-13 NOTE — Progress Notes (Signed)
Patient called RN stating she is about to have a sz, RN rushed to room and saw pt with left hand tremors x4 minutes at 0959, after hand tremors stopped at 1003, left facial noted twitching x 20 seconds. Patient is alert and oreinted the entire time, able to converse with RN and answers questions appropriately. Post-ictal c/o of ""im tired now". MD at bedside now. Will consult neuro. Reinsurance and emotional sup[port given.   Ave Filter, RN

## 2018-11-13 NOTE — Progress Notes (Addendum)
Patient called at 1040 again stating she is starting another sz, [place] characterized by bil leg twitching and jerking, left leg more intense than right leg. 1mg  ativan given, sz activities stopped  After medication administration. sz lasted approximately at 4 minutes. She is awake throughout the sz activities, able to converse with RNs. Please see flow sheet for VS. Emotional support.  MD notified of event. Ave Filter, RN

## 2018-11-13 NOTE — Consult Note (Addendum)
NEURO HOSPITALIST CONSULT NOTE   Requestig physician: Dr. Eliseo Squires  Reason for Consult: Seizure  History obtained from:  Patient and Chart   HPI:                                                                                                                                          Erica [name] is a 63 y.o. female who is being seen for recurrent seizure. She has a medical history significant for alcohol-induced pancreatitis; seizures; HTN; HLD; DM: ESRD on HD; and diastolic CHF. She had missed dialysis 3 times recently and yesterday when she went for dialysis she had what is reported to be a tonic clonic seizure in the waiting room. She was given 1 gm of Keppra. She was transferred from [facility] yesterday because they were unable to do HD there. Na 120 on presentation to [facility], given 100 cc of 3% NaCl and repeat Na 125 yesterday. Hyponatremia was felt to possibly be a contributing factor to breakthrough seizure. Improvement in hyponatremia was expected with HD.  She was seen by Neurology in September and October 2019, by Dr. Cheral Marker and Dr. Leonel Ramsay. 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 She was being seen for breakthrough seizures after starting dialysis, involving thigh twitching, occasionally spreading to her arm and face. These episodes were felt to be consistent with partial status epilepticus and resolved after adding Keppra and Topamax. At that time she had an abnormal electroencephalogram due to a discontinuous slow rhythm with higher voltage over the right centro-parietal region.    MRI Brain 2019 IMPRESSION: Scattered punctate foci of acute infarction manifest as foci of restricted diffusion within the deep white matter at both frontal parietal vertices. 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 No evidence of  cortical or large vessel territory infarction. Atrophy and chronic small-vessel ischemic changes elsewhere as described above, most prominently affecting the pons.  MRA Brain 2019 IMPRESSION: 1. Motion degraded examination. No emergent large vessel occlusion or flow-limiting stenosis. 2. Mild intracranial atherosclerosis versus motion artifact.  Recommendations in Oct 2019:: Continue Keppra 1 g daily with 500 mg additional after dialysis Continue Dilantin 125 mg twice daily, dosed by pharmacy Continue Topamax 25 mg twice daily, consider increasing to 50 mg twice daily after 2 weeks.  She is currently taking Topamax 25mg  BID, Dilantin 150mg  BID and Keppra 1,000mg  Daily for her seizures.   Today the patient had two seizure episodes witnessed by the RN: Episode 1:  "Patient called RN stating she is about to have a sz, RN rushed to room and saw pt with left hand tremors x4 minutes at 0959, after hand tremors stopped at 1003, left facial  noted twitching x 20 seconds. Patient is alert and oreinted the entire time, able to converse with RN and answers questions appropriately. Post-ictal c/o of ""im tired now". Episode 2: "Patient called at 1040 again stating she is starting another sz, [place] characterized by bil leg twitching and jerking, left leg more intense than right leg. 1 mg ativan given, sz activities stopped  After medication administration. sz lasted approximately at 4 minutes. She is awake throughout the sz activities, able to converse with RNs."  The patient reports that she always knows when her seizures are starting. She develops a numb and tingling sensation in her left foot. She then develops jerking in her leg that then spreads to her arm and to her face.    Past Medical History:  Diagnosis Date  . Alcohol-induced pancreatitis   . Chronic diarrhea   . Depression   . Diabetes mellitus    fasting blood sugar 110-120s  . Diastolic CHF ([place])   . DKA (diabetic ketoacidoses)  ([place])   . Gastroparesis   . GERD (gastroesophageal reflux disease)   . Heart murmur   . History of kidney stones   . Hyperlipidemia   . Hypertension   . Hypokalemia   . Muscle spasm   . Neuropathic pain   . Neuropathy    Hx: of  . Pyelonephritis   . Seizures ([place])   . Vitamin B12 deficiency   . Vitamin D deficiency     Past Surgical History:  Procedure Laterality Date  . AV FISTULA PLACEMENT Left 12/15/2017   Procedure: INSERTION OF ARTERIOVENOUS (AV) GORE-TEX GRAFT ARM;  Surgeon: Rosetta Posner, MD;  Location: [facility];  Service: Vascular;  Laterality: Left;  . CATARACT EXTRACTION W/PHACO Right 03/14/2015   Procedure: CATARACT EXTRACTION PHACO AND INTRAOCULAR LENS PLACEMENT (IOC);  Surgeon: Baruch Goldmann, MD;  Location: [facility];  Service: Ophthalmology;  Laterality: Right;  CDE:11.13  . CATARACT EXTRACTION W/PHACO Left 04/11/2015   Procedure: CATARACT EXTRACTION PHACO AND INTRAOCULAR LENS PLACEMENT LEFT EYE CDE=9.68;  Surgeon: Baruch Goldmann, MD;  Location: [facility];  Service: Ophthalmology;  Laterality: Left;  . COLONOSCOPY  02/24/2010  . cystoscopy with ureteral stent  Bilateral 10/07/2017   At [place] CV LINE RIGHT  11/17/2017  . IR FLUORO GUIDE CV LINE RIGHT  11/22/2017  . IR REMOVAL TUN CV CATH W/O FL  01/26/2018  . IR US GUIDE VASC ACCESS RIGHT  11/17/2017  . MULTIPLE EXTRACTIONS WITH ALVEOLOPLASTY N/A 06/13/2012   Procedure: MULTIPLE EXTRACION WITH ALVEOLOPLASTY EXTRACT: 18, 19, 20, 21, 22, 24, 25, 27, 28, 29, 30, 31;  Surgeon: Gae Bon, DDS;  Location: [facility];  Service: Oral Surgery;  Laterality: N/A;  . TUBAL LIGATION    . URETERAL STENT PLACEMENT  09/2017    Family History  Problem Relation Age of Onset  . Diabetes Sister   . Chronic Renal Failure Neg Hx    Social History:  reports that she has never smoked. Her smokeless tobacco use includes snuff. She reports current alcohol use of about 1.0 standard drinks of alcohol per week. She reports  that she does not use drugs.  Allergies  Allergen Reactions  . Aspirin Palpitations    Listed on [facility] 11/12/18    MEDICATIONS:  I have reviewed the patient's current medications.   ROS:                                                                                                                                       History obtained from patient   General ROS: negative for - chills, fever, night sweats, weight gain or weight loss. She does report frequent difficulty sleeping in her nursing home which causes her to feel fatigued during the day. Psychological ROS: she reports some memory trouble, she was aware could not recall when her seizures first started or how long she had been in a nursing home.  Ophthalmic ROS: negative for blurry vision, double vision, eye pain or loss of vision ENT ROS: negative for - nasal discharge, oral lesions, sore throat, or vertigo Allergy and Immunology ROS: negative for - hives or itchy/watery eyes Hematological and Lymphatic ROS: negative for - bleeding problems Respiratory ROS: negative for - cough, hemoptysis, shortness of breath or wheezing Cardiovascular ROS: negative for - chest pain, dyspnea Gastrointestinal ROS: negative for - abdominal pain, diarrhea, nausea/vomiting  Neurological ROS: as noted in HPI  Blood pressure (!) 174/82, pulse 92, temperature 98.4 F (36.9 C), temperature source Oral, resp. rate 18, SpO2 100 %.   General Examination:                                                                                                       Physical Exam  HEENT-  Normocephalic  Normal external eye and conjunctiva.   Cardiovascular-pulses palpable throughout   Lungs-no excessive working breathing.  Saturations within normal limits Extremities- Warm, dry and intact Skin-warm and dry  Neurological Examination Mental  Status: Alert, oriented, thought content appropriate.  Speech fluent without evidence of aphasia.  Able to follow 3 step commands without difficulty. Cranial Nerves: II: Visual fields grossly normal,  III,IV, VI: ptosis not present, extra-ocular motions intact bilaterally pupils equal, round, reactive to light and accommodation V,VII: smile symmetric, facial light touch sensation normal bilaterally VIII: hearing normal bilaterally XI: bilateral shoulder shrug strength strong XII: midline tongue extension, dry tongue Motor: Right : Upper extremity   5/5    Left:     Upper extremity   5/5  Lower extremity   5/5     Lower extremity   5/5 Tone and bulk:normal tone throughout; no atrophy noted Sensory: light touch intact throughout, bilaterally Cerebellar: normal finger-to-nose and normal heel-to-shin test   Lab Results: Basic Metabolic Panel: Recent  Labs  Lab 11/12/18 1250 11/12/18 1257 11/12/18 1841 11/13/18 0401  NA 122* 125* 124* 132*  K 5.6* 6.0* 5.1 3.7  CL 87* 82* 85* 92*  CO2  --  17* 16* 23  GLUCOSE 221* 225* 145* 137*  BUN 94* 100* 100* 43*  CREATININE 9.30* 9.14* 9.28* 5.65*  CALCIUM  --  7.1* 6.9* 7.7*  MG  --  2.5*  --   --   PHOS  --  10.9* 11.1*  --     CBC: Recent Labs  Lab 11/12/18 1250 11/12/18 1841 11/13/18 0401  WBC  --  7.5 8.5  NEUTROABS  --  4.9  --   HGB 12.6 10.6* 11.0*  HCT 37.0 31.4* 32.4*  MCV  --  101.9* 101.3*  PLT  --  242 226     Assessment: 63 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 Seen for same in 2019 by [facility]. Episodes consist of twitching in left leg, spreading to left arm and face.  1. On examination she is alert and oriented. She did have difficulty remembering when she first developed seizures or how long she has been in her nursing home. Neurologic exam was otherwise unremarkable and no seizure activity was observed.  2. She has a medical history significant for alcohol-induced pancreatitis;  seizures; HTN; HLD; DM: ESRD on HD; and diastolic CHF.   Recommendations: 1. Continue Keppra 1,000mg  QAM 2. Start 500 mg of Keppra on days following dialysis treatments (Tue-Th-Sat) 3. Continue Dilantin 150 mg BID. Dilantin level ordered 4. Continue Topamax 25 mg BID. Consider increasing to 50 mg twice daily after 2 weeks if she has another seizure. 5. Continue seizure precautions.    Gwenyth Bouillon DNP, FNP-C [facility] Nurse Practitioner  [phone]  I have seen and examined the patient. I have discussed the assessment and recommendations with Gwenyth Bouillon, NP. 63 year old female with breakthrough simple partial seizures involving her LLE, LUE and left face. Exam is nonlateralizing. Recommendations as above.  Electronically signed: Dr. Kerney Elbe

## 2018-11-14 ENCOUNTER — Other Ambulatory Visit: Payer: Self-pay

## 2018-11-14 ENCOUNTER — Inpatient Hospital Stay (HOSPITAL_COMMUNITY)
Admit: 2018-11-14 | Discharge: 2018-11-14 | Disposition: A | Discharge status: Home / Self Care | Payer: Medicaid Other | Attending: Internal Medicine | Admitting: Internal Medicine

## 2018-11-14 DIAGNOSIS — E119 Type 2 diabetes mellitus without complications: Secondary | ICD-10-CM

## 2018-11-14 LAB — GLUCOSE, CAPILLARY
Glucose-Capillary: 88 mg/dL (ref 70–99)
Glucose-Capillary: 136 mg/dL — ABNORMAL HIGH (ref 70–99)
Glucose-Capillary: 156 mg/dL — ABNORMAL HIGH (ref 70–99)
Glucose-Capillary: 11 mg/dL — CL (ref 70–99)
Glucose-Capillary: 410 mg/dL — ABNORMAL HIGH (ref 70–99)
Glucose-Capillary: 132 mg/dL — ABNORMAL HIGH (ref 70–99)

## 2018-11-14 LAB — RENAL FUNCTION PANEL
Albumin: 2.8 g/dL — ABNORMAL LOW (ref 3.5–5.0)
Anion gap: 17 — ABNORMAL HIGH (ref 5–15)
BUN: 52 mg/dL — ABNORMAL HIGH (ref 8–23)
CO2: 23 mmol/L (ref 22–32)
Calcium: 7.9 mg/dL — ABNORMAL LOW (ref 8.9–10.3)
Chloride: 90 mmol/L — ABNORMAL LOW (ref 98–111)
Creatinine, Ser: 7 mg/dL — ABNORMAL HIGH (ref 0.44–1.00)
GFR calc Af Amer: 7 mL/min — ABNORMAL LOW (ref >60)
GFR calc non Af Amer: 6 mL/min — ABNORMAL LOW (ref >60)
Glucose, Bld: 134 mg/dL — ABNORMAL HIGH (ref 70–99)
Phosphorus: 6.7 mg/dL — ABNORMAL HIGH (ref 2.5–4.6)
Potassium: 3.4 mmol/L — ABNORMAL LOW (ref 3.5–5.1)
Sodium: 130 mmol/L — ABNORMAL LOW (ref 135–145)

## 2018-11-14 LAB — CBC
HCT: 32.6 % — ABNORMAL LOW (ref 36.0–46.0)
Hemoglobin: 11 g/dL — ABNORMAL LOW (ref 12.0–15.0)
MCH: 34.7 pg — ABNORMAL HIGH (ref 26.0–34.0)
MCHC: 33.7 g/dL (ref 30.0–36.0)
MCV: 102.8 fL — ABNORMAL HIGH (ref 80.0–100.0)
Platelets: 243 10*3/uL (ref 150–400)
RBC: 3.17 MIL/uL — ABNORMAL LOW (ref 3.87–5.11)
RDW: 12.2 % (ref 11.5–15.5)
WBC: 6.6 10*3/uL (ref 4.0–10.5)
nRBC: 0 % (ref 0.0–0.2)

## 2018-11-14 LAB — MRSA PCR SCREENING: MRSA by PCR: NEGATIVE

## 2018-11-14 MED ORDER — DEXTROSE 50 % IV SOLN
INTRAVENOUS | Status: AC
  Administered 2018-11-14: 50 mL
  Filled 2018-11-14: qty 50

## 2018-11-14 MED ORDER — CARVEDILOL 6.25 MG PO TABS
6.2500 mg | ORAL_TABLET | Freq: Two times a day (BID) | ORAL | Status: DC
  Administered 2018-11-14 – 2018-11-16 (×4): 6.25 mg via ORAL
  Filled 2018-11-14 (×4): qty 1

## 2018-11-14 MED ORDER — HEPARIN SODIUM (PORCINE) 1000 UNIT/ML DIALYSIS: 20.0000 [IU]/kg | INTRAMUSCULAR | Status: DC | PRN

## 2018-11-14 NOTE — Evaluation (Addendum)
Physical Therapy Evaluation Patient Details Name: Erica [name] MRN: [id number] DOB: 1955-12-04 Today's Date: 11/14/2018   History of Present Illness  63 yo female with onset of suspected seizures was admitted, noted her refusal of HD in the last week.  Meds for seizures managed, had clear EEG and note elevated creatinine.  Has also frequent diarrhea, has mild fluid overload.  PMHx:  ESRD, LUE HD port, CHF, pancreatitis, HTN, seizures, DM  Clinical Impression  Pt is up to walk with assistance, but note two issues of diarrhea and the presence of blood on the bed near her HD port.  The nurse was in to discuss both, and will have pt seen for ck of HD bandage and to follow up with her GI issue.  Pt is in bed with some fatigue from the effort, and will anticipate her need for SNF to finish rehab toward going home.  Pt is anticipating therapy needs, and will be agreeable to follow acutely to work on strength, balance and endurance with gait on RW.    Follow Up Recommendations SNF    Equipment Recommendations  None recommended by PT    Recommendations for Other Services       Precautions / Restrictions Precautions Precautions: Fall Precaution Comments: monitor for continuing diarrhea Restrictions Weight Bearing Restrictions: No      Mobility  Bed Mobility Overal bed mobility: Needs Assistance Bed Mobility: Supine to Sit;Sit to Supine     Supine to sit: Min assist Sit to supine: Min assist   General bed mobility comments: min assist to support trunk out of bed, min to reposition in bed upon return  Transfers Overall transfer level: Needs assistance Equipment used: Rolling walker (2 wheeled);1 person hand held assist Transfers: Sit to/from Stand Sit to Stand: Min assist         General transfer comment: cued hand placemetn  Ambulation/Gait Ambulation/Gait assistance: Min guard;Min assist Gait Distance (Feet): 30 Feet Assistive device: Rolling walker (2 wheeled);1  person hand held assist Gait Pattern/deviations: Step-through pattern;Decreased stride length;Wide base of support;Trunk flexed(pt is having incontinence with gait) Gait velocity: reduced Gait velocity interpretation: <1.31 ft/sec, indicative of household ambulator    Stairs            Wheelchair Mobility    Modified Rankin (Stroke Patients Only)       Balance Overall balance assessment: Needs assistance Sitting-balance support: Feet supported Sitting balance-Leahy Scale: Fair     Standing balance support: Bilateral upper extremity supported;During functional activity Standing balance-Leahy Scale: Poor                               Pertinent Vitals/Pain Pain Assessment: No/denies pain    Home Living Family/patient expects to be discharged to:: Skilled nursing facility Living Arrangements: Children Available Help at Discharge: Other (Comment) Type of Home: Apartment Home Access: Level entry     Home Layout: One level Home Equipment: Walker - 2 wheels Additional Comments: pt was last in [place] and will need to increase independent movement to go home    Prior Function Level of Independence: Independent with assistive device(s)(RW at home)               Hand Dominance   Dominant Hand: Right    Extremity/Trunk Assessment   Upper Extremity Assessment Upper Extremity Assessment: Generalized weakness    Lower Extremity Assessment Lower Extremity Assessment: Generalized weakness    Cervical / Trunk Assessment  Cervical / Trunk Assessment: Kyphotic  Communication   Communication: No difficulties  Cognition Arousal/Alertness: Awake/alert Behavior During Therapy: WFL for tasks assessed/performed Overall Cognitive Status: No family/caregiver present to determine baseline cognitive functioning                                 General Comments: was not aware of her diarrhea having occurred before and during movement with  PT      General Comments General comments (skin integrity, edema, etc.): pt is unsteady on her feet, takes small careful steps with LLE struggling to accept wgt and balance for step through    Exercises     Assessment/Plan    PT Assessment Patient needs continued PT services  PT Problem List Decreased strength;Decreased range of motion;Decreased activity tolerance;Decreased balance;Decreased mobility;Decreased cognition;Decreased coordination;Decreased knowledge of use of DME;Decreased safety awareness;Decreased skin integrity       PT Treatment Interventions DME instruction;Gait training;Functional mobility training;Therapeutic activities;Therapeutic exercise;Balance training;Neuromuscular re-education;Patient/family education    PT Goals (Current goals can be found in the Care Plan section)  Acute Rehab PT Goals Patient Stated Goal: to get home when ready PT Goal Formulation: With patient Time For Goal Achievement: 11/28/18 Potential to Achieve Goals: Good    Frequency Min 2X/week   Barriers to discharge Decreased caregiver support home with family but will need 24/7 care    Co-evaluation               AM-PAC PT "6 Clicks" Mobility  Outcome Measure Help needed turning from your back to your side while in a flat bed without using bedrails?: A Little Help needed moving from lying on your back to sitting on the side of a flat bed without using bedrails?: A Little Help needed moving to and from a bed to a chair (including a wheelchair)?: A Little Help needed standing up from a chair using your arms (e.g., wheelchair or bedside chair)?: A Little Help needed to walk in hospital room?: A Little Help needed climbing 3-5 steps with a railing? : A Lot 6 Click Score: 17    End of Session Equipment Utilized During Treatment: Gait belt Activity Tolerance: Patient limited by fatigue;Treatment limited secondary to medical complications (Comment) Patient left: in bed;with call  bell/phone within reach;with bed alarm set Nurse Communication: Mobility status PT Visit Diagnosis: Muscle weakness (generalized) (M62.81);Unsteadiness on feet (R26.81);Dizziness and giddiness (R42)    Time: [id number] PT Time Calculation (min) (ACUTE ONLY): 27 min   Charges:   PT Evaluation $PT Eval Moderate Complexity: 1 Mod PT Treatments $Gait Training: 8-22 mins       Ramond Dial 11/14/2018, 5:40 PM   Mee Hives, PT MS Acute Rehab Dept. Number: [place] [facility] [place]

## 2018-11-14 NOTE — Progress Notes (Signed)
[facility] PROGRESS NOTE  Erica [name] [id number] DOB: 1955-12-01 DOA: 11/12/2018 PCP: Scotty Court, DO  Brief summary   Erica [name] is an 63 y.o. female (lives at SNF) with medical history significant ofh/o alcohol-induced pancreatitis; seizures; HTN; HLD; DM: ESRD on HD; and diastolic CHF presenting with seizures.The patient reports feeling weak. She missed 3 episodes of HD recently, decided to go today and had a seizure in the waiting room.  Assessment/Plan:  Seizures -Patient with known seizure disorder presenting with recurrent seizures, likely associated with metabolic d/o in the setting of HD non-compliance (see below) -She takes Dilantin, Topamax, and Keppra (once daily?) and was loaded with a gram of Keppra at [facility] -s/p HD -no new seizures today.  -neurology consult. EEG pending. Dilantin level at 6.6. per neurology: Continue Keppra 1,000mg  QAM. Start 500 mg of Keppra on days following dialysis treatments (Tue-Th-Sat). Continue Dilantin 150 mg BID. Continue Topamax 25 mg BID. Consider increasing to 50 mg twice daily after 2 weeks if she has another seizure.  ESRD with non-compliance with HD -She reports "family problems" that have prevented her from attending her 3 most recent HD sessions -Nephrology has seen the patient and she will be dialyzed 9/26 and again on 9/28, 9/29 -Continue Bicarb  Hyponatremia -improved with fluid removal  Low calcium -defer to renal  HTN -BP is not well controlled. Increased coreg   HLD -Continue Lipitor  DM -on lantus 12U.  ha1c-6.6 -Will cover with sensitive-scale SSI for now  Chronic diastolic CHF -Volume management with HD  Code Status: fyull Family Communication: d/w patient, RN. Called updated her family (indicate person spoken with, relationship, and if by phone, the number) Disposition Plan: obtain pt/ot. 24-48 hrs SNF   Consultants:  Neurology   Nephrology    Procedures:  none  Antibiotics: Anti-infectives (From admission, onward)   None        (indicate start date, and stop date if known)  HPI/Subjective: No acute distress. No new seizures. Reports feeling well.   Objective: Vitals:   11/13/18 2027 11/14/18 0436  BP: (!) 170/76 (!) 176/81  Pulse: 90 75  Resp: 18 20  Temp: 99.5 F (37.5 C) 98.4 F (36.9 C)  SpO2: 97% 98%    Intake/Output Summary (Last 24 hours) at 11/14/2018 0825 Last data filed at 11/14/2018 0251 Gross per 24 hour  Intake 920 ml  Output 200 ml  Net 720 ml   Filed Weights   11/13/18 1400  Weight: 47.4 kg    Exam:   General:  No acute distress   Cardiovascular: s1,s2 rrr  Respiratory: CTA BL  Abdomen: soft, nt   Musculoskeletal: no leg edema    Data Reviewed: Basic Metabolic Panel: Recent Labs  Lab 11/12/18 1250 11/12/18 1257 11/12/18 1841 11/13/18 0401  NA 122* 125* 124* 132*  K 5.6* 6.0* 5.1 3.7  CL 87* 82* 85* 92*  CO2  --  17* 16* 23  GLUCOSE 221* 225* 145* 137*  BUN 94* 100* 100* 43*  CREATININE 9.30* 9.14* 9.28* 5.65*  CALCIUM  --  7.1* 6.9* 7.7*  MG  --  2.5*  --   --   PHOS  --  10.9* 11.1*  --    Liver Function Tests: Recent Labs  Lab 11/12/18 1841 11/13/18 0401  AST  --  61*  ALT  --  57*  ALKPHOS  --  638*  BILITOT  --  0.9  PROT  --  6.4*  ALBUMIN 3.1* 2.9*  No results for input(s): LIPASE, AMYLASE in the last 168 hours. No results for input(s): AMMONIA in the last 168 hours. CBC: Recent Labs  Lab 11/12/18 1250 11/12/18 1841 11/13/18 0401  WBC  --  7.5 8.5  NEUTROABS  --  4.9  --   HGB 12.6 10.6* 11.0*  HCT 37.0 31.4* 32.4*  MCV  --  101.9* 101.3*  PLT  --  242 226   Cardiac Enzymes: No results for input(s): CKTOTAL, CKMB, CKMBINDEX, TROPONINI in the last 168 hours. BNP (last 3 results) No results for input(s): BNP in the last 8760 hours.  ProBNP (last 3 results) No results for input(s): PROBNP in the last 8760 hours.  CBG: Recent  Labs  Lab 11/13/18 0541 11/13/18 1106 11/13/18 1608 11/13/18 2127 11/14/18 0652  GLUCAP 144* 197* 101* 229* 136*    Recent Results (from the past 240 hour(s))  SARS Coronavirus 2 [facility] order, Performed in [facility] hospital lab) Nasopharyngeal Nasopharyngeal Swab     Status: None   Collection Time: 11/12/18  1:02 PM   Specimen: Nasopharyngeal Swab  Result Value Ref Range Status   SARS Coronavirus 2 NEGATIVE NEGATIVE Final    Comment: (NOTE) If result is NEGATIVE SARS-CoV-2 target nucleic acids are NOT DETECTED. The SARS-CoV-2 RNA is generally detectable in upper and lower  respiratory specimens during the acute phase of infection. The lowest  concentration of SARS-CoV-2 viral copies this assay can detect is 250  copies / mL. A negative result does not preclude SARS-CoV-2 infection  and should not be used as the sole basis for treatment or other  patient management decisions.  A negative result may occur with  improper specimen collection / handling, submission of specimen other  than nasopharyngeal swab, presence of viral mutation(s) within the  areas targeted by this assay, and inadequate number of viral copies  (<250 copies / mL). A negative result must be combined with clinical  observations, patient history, and epidemiological information. If result is POSITIVE SARS-CoV-2 target nucleic acids are DETECTED. The SARS-CoV-2 RNA is generally detectable in upper and lower  respiratory specimens dur ing the acute phase of infection.  Positive  results are indicative of active infection with SARS-CoV-2.  Clinical  correlation with patient history and other diagnostic information is  necessary to determine patient infection status.  Positive results do  not rule out bacterial infection or co-infection with other viruses. If result is PRESUMPTIVE POSTIVE SARS-CoV-2 nucleic acids MAY BE PRESENT.   A presumptive positive result was obtained on the submitted specimen  and  confirmed on repeat testing.  While 2019 novel coronavirus  (SARS-CoV-2) nucleic acids may be present in the submitted sample  additional confirmatory testing may be necessary for epidemiological  and / or clinical management purposes  to differentiate between  SARS-CoV-2 and other Sarbecovirus currently known to infect humans.  If clinically indicated additional testing with an alternate test  methodology [phone]) is advised. The SARS-CoV-2 RNA is generally  detectable in upper and lower respiratory sp ecimens during the acute  phase of infection. The expected result is Negative. Fact Sheet for Patients:  StrictlyIdeas.no Fact Sheet for Healthcare Providers: BankingDealers.co.za This test is not yet approved or cleared by the [place] FDA and has been authorized for detection and/or diagnosis of SARS-CoV-2 by FDA under an Emergency Use Authorization (EUA).  This EUA will remain in effect (meaning this test can be used) for the duration of the COVID-19 declaration under Section 564(b)(1) of the Act, 21  U.S.C. section 360bbb-3(b)(1), unless the authorization is terminated or revoked sooner. Performed at [facility], [place] [address] [place], [place] [address]      Studies: No results found.  Scheduled Meds: . atorvastatin  40 mg Oral QPM  . [START ON 11/15/2018] calcitRIOL  0.25 mcg Oral Q T,Th,Sa-HD  . calcium acetate  1,334 mg Oral TID WC  . carvedilol  3.125 mg Oral BID  . Chlorhexidine Gluconate Cloth  6 each Topical Q0600  . ferric citrate  420 mg Oral TID WC  . folic acid  1 mg Oral q morning - 10a  . heparin  5,000 Units Subcutaneous Q8H  . insulin aspart  0-9 Units Subcutaneous TID WC  . insulin glargine  12 Units Subcutaneous Daily  . levETIRAcetam  1,000 mg Oral q morning - 10a  . [START ON 11/15/2018] levETIRAcetam  500 mg Oral Q T,Th,Sa-HD  . phenytoin  150 mg Oral BID  . sodium bicarbonate  650 mg Oral QID   . topiramate  25 mg Oral BID   Continuous Infusions:  Principal Problem:   Seizure (HCC) Active Problems:   Chronic diastolic congestive heart failure (HCC)   Essential hypertension   Seizures ([place])   Diabetes mellitus type 2 in nonobese ([place])   ESRD (end stage renal disease) ([place])    Time spent: >25 minutes     Kinnie Feil  [facility] Pager [phone]. If 7PM-7AM, please contact night-coverage at [email or web address], password [facility] 11/14/2018, 8:25 AM  LOS: 1 day

## 2018-11-14 NOTE — Progress Notes (Signed)
Renal Navigator notified by Nephrologist/Dr. Jonnie Finner to evaluate issue with missing HD recently. Navigator contacted [facility], patient's residence, and spoke with Arboriculturist. She states patient missed HD last week due to "refusing" to go. Renal Navigator met with patient at bedside to see about her response. She also states that she "didn't know" why she didn't go, but guesses she "didn't want to and refused." Renal Navigator ensured that she had been offered to be taken by SNF staff and she confirmed. She states she now realizes what happens when she doesn't go and states plans to no longer refuse. Renal Navigator also ensured that she was given the opportunity of having a conversation about asking questions about the importance of going to HD or about stopping HD if she felt she wanted this to be her choice. She states she wants to continue. Renal Navigator provided support and encouragement in continuing HD and attending all treatments. Patient seemed appreciative and states no further questions, concerns or needs at this time. Dr. Jonnie Finner updated. Per [facility], patient will need an updated COVID test prior to returning to the facility. Renal Navigator notified CSW/V. Crawford.  Alphonzo Cruise,  Renal Navigator [phone]

## 2018-11-14 NOTE — Progress Notes (Signed)
[facility] Progress Note   Subjective:    Seen in room. EEG in progress. No further tremors/jerking reported.  For extra HD today off schedule.   Objective Vitals:   11/13/18 1503 11/13/18 2027 11/14/18 0436 11/14/18 0850  BP: (!) 159/85 (!) 170/76 (!) 176/81 (!) 172/70  Pulse: 89 90 75 72  Resp: 18 18 20 18   Temp: 98.6 F (37 C) 99.5 F (37.5 C) 98.4 F (36.9 C) 98.6 F (37 C)  TempSrc: Oral Oral Oral Oral  SpO2: 99% 97% 98% 99%  Weight:      Height:       Physical Exam General: Well developed, frail appearing female in NAD Heart: RRR, no murmurs, rubs or gallops  Lungs: Inspiratory crackles RLL. No wheezing auscultated. Unlabored on RA Abdomen: Soft, non-tender, non-distended. +BS Extremities: No peripheral edema Dialysis Access: LUE AVF  + bruit  Additional Objective Labs: Basic Metabolic Panel: Recent Labs  Lab 11/12/18 1257 11/12/18 1841 11/13/18 0401  NA 125* 124* 132*  K 6.0* 5.1 3.7  CL 82* 85* 92*  CO2 17* 16* 23  GLUCOSE 225* 145* 137*  BUN 100* 100* 43*  CREATININE 9.14* 9.28* 5.65*  CALCIUM 7.1* 6.9* 7.7*  PHOS 10.9* 11.1*  --    Liver Function Tests: Recent Labs  Lab 11/12/18 1841 11/13/18 0401  AST  --  61*  ALT  --  57*  ALKPHOS  --  638*  BILITOT  --  0.9  PROT  --  6.4*  ALBUMIN 3.1* 2.9*   No results for input(s): LIPASE, AMYLASE in the last 168 hours. CBC: Recent Labs  Lab 11/12/18 1250 11/12/18 1841 11/13/18 0401  WBC  --  7.5 8.5  NEUTROABS  --  4.9  --   HGB 12.6 10.6* 11.0*  HCT 37.0 31.4* 32.4*  MCV  --  101.9* 101.3*  PLT  --  242 226   Blood Culture    Component Value Date/Time   SDES URINE, CATHETERIZED 12/10/2017 0849   SPECREQUEST NONE 12/10/2017 0849   CULT  12/10/2017 0849    NO GROWTH Performed at [facility], [place] [address], [place], [place] [address]    REPTSTATUS 12/11/2017 FINAL 12/10/2017 0849    Cardiac Enzymes: No results for input(s): CKTOTAL, CKMB, CKMBINDEX,  TROPONINI in the last 168 hours. CBG: Recent Labs  Lab 11/13/18 1106 11/13/18 1608 11/13/18 2127 11/14/18 0652 11/14/18 1105  GLUCAP 197* 101* 229* 136* 156*   Iron Studies: No results for input(s): IRON, TIBC, TRANSFERRIN, FERRITIN in the last 72 hours. @lablastinr3 @ Studies/Results: No results found. Medications:  . atorvastatin  40 mg Oral QPM  . [START ON 11/15/2018] calcitRIOL  0.25 mcg Oral Q T,Th,Sa-HD  . calcium acetate  1,334 mg Oral TID WC  . carvedilol  6.25 mg Oral BID  . Chlorhexidine Gluconate Cloth  6 each Topical Q0600  . ferric citrate  420 mg Oral TID WC  . folic acid  1 mg Oral q morning - 10a  . heparin  5,000 Units Subcutaneous Q8H  . insulin aspart  0-9 Units Subcutaneous TID WC  . insulin glargine  12 Units Subcutaneous Daily  . levETIRAcetam  1,000 mg Oral q morning - 10a  . [START ON 11/15/2018] levETIRAcetam  500 mg Oral Q T,Th,Sa-HD  . phenytoin  150 mg Oral BID  . sodium bicarbonate  650 mg Oral QID  . topiramate  25 mg Oral BID    Dialysis Orders: Center: [facility]  on TTS- last 11/03/2018. EDW 43.5kg, Time: 3.75 hours, 2K/ 2.5 Ca, BFR 400, DFR 600,  Dialyzer: [place] Revacir 200, LUE AVF Heparin 1000 unit load, 500 units/hour (stop 60 min before end of HD) Phoslo 667mg  2 tabs PO TID  Assessment/Plan: 1. Seizure: Known history of seizure disorder, on dilantin, keppra and topamax as outpatient.  Uremia from missed HD may be a big contributor to seizure activity. Pt improving clinical following HD. Labs improved.  Plan extra HD today then Tuesday to get back on schedule. Counseled pt about not missing HD sessions.   2.  ESRD:  TTS [place]. Missed HD x 3 prior to admission. Will plan for additional abbreviated dialysis today, then resume TTS schedule.  3.  Hypertension/volume: BP remains elevated. UF goal 2L today.  4.  Anemia: Hemoglobin 11.0.  No indication for ESA at this time.  5.  Metabolic bone disease: Calcium 7.7, 8.6 corrected. Phos 10.9.  Supposed to be taking phoslo 2 tabs AC as an outpatient but doubt compliance based on labs. Resume here. 6. Nutrition:  Recommend renal diet with fluid restrictions.  7. Noncompliance with dialysis Rx. Missed HD x 3 prior to admission. Pt resides in [facility]. Unclear why she has been missing treatments. SW investigating.   Lynnda Child PA-C [facility] Pager [phone] 11/14/2018,11:54 AM

## 2018-11-14 NOTE — Progress Notes (Signed)
Subjective: No further seizures  She has chronic numbness on the lateral aspect of her left leg.   Exam: Vitals:   11/14/18 0436 11/14/18 0850  BP: (!) 176/81 (!) 172/70  Pulse: 75 72  Resp: 20 18  Temp: 98.4 F (36.9 C) 98.6 F (37 C)  SpO2: 98% 99%   Gen: In bed, NAD Resp: non-labored breathing, no acute distress Abd: soft, nt  Neuro: MS: Awake, alert, pleasant and cooperative [id number], EOMI Motor: She has mild weakness of eversion in the left leg.  Sensory:decreased on lateral aspect of her left lower leg. (chronic per patient)  Pertinent Labs: PHT 6.6, albumin 2.9 - corrects to 9.7 Na 132 Cr 5.65  Impression: 63 yo F with breakthrough seizures in the setting of subtheraputic dilantin level, hyponatremia and uremia. She has been increased from 125mg  BID to 150mg  BID.   Her leg numbness is chronic and I suspect she may have a mild peroneal palsy due to crossing legs, I advised her to stop. If it does not improve, could consider outpatient EMG.   I agree uremia likely played role in her breakthrough seizures, but given low dilantin level, still favor small increase which has been done.   Recommendations: 1)Continue dilantin 150mg  BID(increased from home dose) 2) Continue topamax 25mg  IBD 3) continue keppra 1g daily with additional 500mg  post dialysis.  4) No further recommendations at this time, please call with further questions or concerns.   Roland Rack, MD [facility] [phone]  If 7pm- 7am, please page neurology on call as listed in [place].

## 2018-11-14 NOTE — Procedures (Signed)
Patient Name: Erica [name]  MRN: [id number]  Epilepsy Attending: [name]  Referring Physician/Provider: Dr. Eulogio Bear Date: 11/14/2018 Duration: 24.47 minutes  Patient history: 63 year old female with breakthrough seizures in setting of subtherapeutic Dilantin level.  EEG to evaluate for seizures.  Level of alertness: awake, asleep  AEDs during EEG study: Dilantin, Topamax, Keppra  Technical aspects: This EEG study was done with scalp electrodes positioned according to the 10-20 International system of electrode placement. Electrical activity was acquired at a sampling rate of 500Hz  and reviewed with a high frequency filter of 70Hz  and a low frequency filter of 1Hz . EEG data were recorded continuously and digitally stored.   DESCRIPTION:  The posterior dominant rhythm consists of 7-8 Hz activity of moderate voltage (25-35 uV) seen predominantly in posterior head regions, symmetric and reactive to eye opening and eye closing. Sleep was characterized by vertex waves,sleep spindles (12-14Hz ) maximal frontocentral. Physiologic photic driving was seen during photic stimulation. Hyperventilation was not performed.  IMPRESSION: This study is within normal limits. No seizures or epileptiform discharges were seen throughout the recording.  Erica [name]

## 2018-11-14 NOTE — Plan of Care (Signed)
  Problem: Safety: Goal: Ability to remain free from injury will improve Outcome: Progressing   

## 2018-11-14 NOTE — Progress Notes (Signed)
EEG complete - results pending 

## 2018-11-14 NOTE — Progress Notes (Signed)
NT was giving patient a bath and took her blood sugar, glucose reading was 11. Paged MD. Administered two doses of IV tylenol per MD order. Rechecked blood glucose and the reading was 410. MD made aware. Will continue to monitor patient.   Farley Ly RN

## 2018-11-14 NOTE — Plan of Care (Signed)
  Problem: Nutrition: Goal: Adequate nutrition will be maintained Outcome: Progressing   Problem: Coping: Goal: Level of anxiety will decrease Outcome: Progressing   

## 2018-11-15 LAB — GLUCOSE, CAPILLARY
Glucose-Capillary: 47 mg/dL — ABNORMAL LOW (ref 70–99)
Glucose-Capillary: 57 mg/dL — ABNORMAL LOW (ref 70–99)
Glucose-Capillary: 74 mg/dL (ref 70–99)
Glucose-Capillary: 131 mg/dL — ABNORMAL HIGH (ref 70–99)
Glucose-Capillary: 204 mg/dL — ABNORMAL HIGH (ref 70–99)
Glucose-Capillary: 135 mg/dL — ABNORMAL HIGH (ref 70–99)
Glucose-Capillary: 215 mg/dL — ABNORMAL HIGH (ref 70–99)

## 2018-11-15 LAB — NOVEL CORONAVIRUS, NAA (HOSP ORDER, SEND-OUT TO REF LAB; TAT 18-24 HRS): SARS-CoV-2, NAA: NOT DETECTED

## 2018-11-15 MED ORDER — SODIUM CHLORIDE 0.9 % IV SOLN: 100.0000 mL | INTRAVENOUS | Status: DC | PRN

## 2018-11-15 MED ORDER — FERRIC CITRATE 1 GM 210 MG(FE) PO TABS: 420.0000 mg | ORAL_TABLET | Freq: Three times a day (TID) | ORAL | Status: DC

## 2018-11-15 MED ORDER — GLUCOSE 40 % PO GEL
ORAL | Status: AC
  Administered 2018-11-15: 37.5 g
  Filled 2018-11-15: qty 1

## 2018-11-15 MED ORDER — CALCITRIOL 0.25 MCG PO CAPS
ORAL_CAPSULE | ORAL | Status: AC
  Filled 2018-11-15: qty 1

## 2018-11-15 MED ORDER — PENTAFLUOROPROP-TETRAFLUOROETH EX AERO: 1.0000 "application " | INHALATION_SPRAY | CUTANEOUS | Status: DC | PRN

## 2018-11-15 MED ORDER — PHENYTOIN 125 MG/5ML PO SUSP: 150.0000 mg | Freq: Two times a day (BID) | ORAL | 12 refills | Status: DC

## 2018-11-15 MED ORDER — GLUCOSE 40 % PO GEL
ORAL | Status: AC
  Filled 2018-11-15: qty 1

## 2018-11-15 MED ORDER — NEPRO/CARBSTEADY PO LIQD: 237.0000 mL | Freq: Three times a day (TID) | ORAL | 0 refills | Status: DC | PRN

## 2018-11-15 MED ORDER — LIDOCAINE HCL (PF) 1 % IJ SOLN: 5.0000 mL | INTRAMUSCULAR | Status: DC | PRN

## 2018-11-15 MED ORDER — LIDOCAINE-PRILOCAINE 2.5-2.5 % EX CREA: 1.0000 "application " | TOPICAL_CREAM | CUTANEOUS | Status: DC | PRN

## 2018-11-15 MED ORDER — HEPARIN SODIUM (PORCINE) 1000 UNIT/ML DIALYSIS: 1000.0000 [IU] | INTRAMUSCULAR | Status: DC | PRN

## 2018-11-15 MED ORDER — GLUCOSE 40 % PO GEL
1.0000 | ORAL | Status: AC
  Administered 2018-11-15: 37.5 g via ORAL

## 2018-11-15 MED ORDER — LEVETIRACETAM 500 MG PO TABS: 500.0000 mg | ORAL_TABLET | ORAL | Status: DC

## 2018-11-15 MED ORDER — HEPARIN SODIUM (PORCINE) 1000 UNIT/ML DIALYSIS: 3500.0000 [IU] | Freq: Once | INTRAMUSCULAR | Status: DC

## 2018-11-15 MED ORDER — ALTEPLASE 2 MG IJ SOLR: 2.0000 mg | Freq: Once | INTRAMUSCULAR | Status: DC | PRN

## 2018-11-15 MED ORDER — LOPERAMIDE HCL 2 MG PO CAPS
2.0000 mg | ORAL_CAPSULE | Freq: Once | ORAL | Status: AC | PRN
  Administered 2018-11-15: 2 mg via ORAL
  Filled 2018-11-15: qty 1

## 2018-11-15 MED ORDER — CARVEDILOL 6.25 MG PO TABS: 6.2500 mg | ORAL_TABLET | Freq: Two times a day (BID) | ORAL | 0 refills | Status: DC

## 2018-11-15 MED ORDER — GLUCOSE 40 % PO GEL
2.0000 | ORAL | Status: AC
  Administered 2018-11-15: 75 g via ORAL

## 2018-11-15 NOTE — Plan of Care (Signed)
  Problem: Education: Goal: Knowledge of General Education information will improve Description: Including pain rating scale, medication(s)/side effects and non-pharmacologic comfort measures Outcome: Completed/Met   Problem: Clinical Measurements: Goal: Ability to maintain clinical measurements within normal limits will improve Outcome: Completed/Met Goal: Will remain free from infection Outcome: Completed/Met Goal: Diagnostic test results will improve Outcome: Completed/Met Goal: Respiratory complications will improve Outcome: Completed/Met Goal: Cardiovascular complication will be avoided Outcome: Completed/Met   Problem: Nutrition: Goal: Adequate nutrition will be maintained Outcome: Completed/Met   Problem: Coping: Goal: Level of anxiety will decrease Outcome: Completed/Met   Problem: Pain Managment: Goal: General experience of comfort will improve Outcome: Completed/Met   Problem: Skin Integrity: Goal: Risk for impaired skin integrity will decrease Outcome: Completed/Met   Problem: Education: Goal: Knowledge of disease and its progression will improve Outcome: Completed/Met Goal: Individualized Educational Video(s) Outcome: Completed/Met   Problem: Fluid Volume: Goal: Compliance with measures to maintain balanced fluid volume will improve Outcome: Completed/Met   Problem: Nutritional: Goal: Ability to make healthy dietary choices will improve Outcome: Completed/Met   Problem: Clinical Measurements: Goal: Complications related to the disease process, condition or treatment will be avoided or minimized Outcome: Completed/Met

## 2018-11-15 NOTE — Progress Notes (Signed)
Hypoglycemic Event  [id number] for Erica [name], Erica [name] (MRN [id number]) as of 11/15/2018 01:28  Ref. Range 11/15/2018 01:19  Glucose-Capillary Latest Ref Range: 70 - 99 mg/dL 47 (L)     Treatment: 2 tubes glucose gel  Symptoms: None  Follow-up CBG: Time: CBG Result:  Results for Erica [name], Erica [name] (MRN [id number]) as of 11/15/2018 01:48  Ref. Range 11/15/2018 01:39  Glucose-Capillary Latest Ref Range: 70 - 99 mg/dL 57 (L)   Possible Reasons for Event: Inadequate meal intake  Comments/MD notified:    Viviano Simas

## 2018-11-15 NOTE — Progress Notes (Signed)
Paged Dr. Daleen Bo about patient having loose stools.

## 2018-11-15 NOTE — Progress Notes (Addendum)
[facility] Progress Note   Subjective:    Had extra HD session yesterday. For HD today, plans for dc after HD.   Objective Vitals:   11/14/18 1655 11/14/18 2118 11/15/18 0408 11/15/18 0849  BP: (!) 158/58 (!) 181/74 (!) 146/76 (!) 156/70  Pulse: 67 72 93 81  Resp: 18 15 16 18   Temp:  (!) 97.5 F (36.4 C) 98.6 F (37 C) 99 F (37.2 C)  TempSrc:  Oral Oral Oral  SpO2: 98% 100% 98% 99%  Weight:   52.7 kg   Height:       Physical Exam General: Well developed, frail appearing female in NAD Heart: RRR, no murmurs, rubs or gallops  Lungs: Inspiratory crackles RLL. No wheezing auscultated. Unlabored on RA Abdomen: Soft, non-tender, non-distended. +BS Extremities: No peripheral edema Dialysis Access: LUE AVF  + bruit  Additional Objective Labs: Basic Metabolic Panel: Recent Labs  Lab 11/12/18 1257 11/12/18 1841 11/13/18 0401 11/14/18 1232  NA 125* 124* 132* 130*  K 6.0* 5.1 3.7 3.4*  CL 82* 85* 92* 90*  CO2 17* 16* 23 23  GLUCOSE 225* 145* 137* 134*  BUN 100* 100* 43* 52*  CREATININE 9.14* 9.28* 5.65* 7.00*  CALCIUM 7.1* 6.9* 7.7* 7.9*  PHOS 10.9* 11.1*  --  6.7*   Liver Function Tests: Recent Labs  Lab 11/12/18 1841 11/13/18 0401 11/14/18 1232  AST  --  61*  --   ALT  --  57*  --   ALKPHOS  --  638*  --   BILITOT  --  0.9  --   PROT  --  6.4*  --   ALBUMIN 3.1* 2.9* 2.8*   No results for input(s): LIPASE, AMYLASE in the last 168 hours. CBC: Recent Labs  Lab 11/12/18 1841 11/13/18 0401 11/14/18 1232  WBC 7.5 8.5 6.6  NEUTROABS 4.9  --   --   HGB 10.6* 11.0* 11.0*  HCT 31.4* 32.4* 32.6*  MCV 101.9* 101.3* 102.8*  PLT 242 226 243   Blood Culture    Component Value Date/Time   SDES URINE, CATHETERIZED 12/10/2017 0849   SPECREQUEST NONE 12/10/2017 0849   CULT  12/10/2017 0849    NO GROWTH Performed at [facility], [place] [address], [place], [place] [address]    REPTSTATUS 12/11/2017 FINAL 12/10/2017 0849    Cardiac  Enzymes: No results for input(s): CKTOTAL, CKMB, CKMBINDEX, TROPONINI in the last 168 hours. CBG: Recent Labs  Lab 11/15/18 0119 11/15/18 0139 11/15/18 0155 11/15/18 0334 11/15/18 0655  GLUCAP 47* 57* 74 131* 204*   Iron Studies: No results for input(s): IRON, TIBC, TRANSFERRIN, FERRITIN in the last 72 hours. @lablastinr3 @ Studies/Results: No results found. Medications:  . atorvastatin  40 mg Oral QPM  . calcitRIOL  0.25 mcg Oral Q T,Th,Sa-HD  . calcium acetate  1,334 mg Oral TID WC  . carvedilol  6.25 mg Oral BID  . Chlorhexidine Gluconate Cloth  6 each Topical Q0600  . dextrose      . ferric citrate  420 mg Oral TID WC  . folic acid  1 mg Oral q morning - 10a  . heparin  5,000 Units Subcutaneous Q8H  . levETIRAcetam  1,000 mg Oral q morning - 10a  . levETIRAcetam  500 mg Oral Q T,Th,Sa-HD  . phenytoin  150 mg Oral BID  . sodium bicarbonate  650 mg Oral QID  . topiramate  25 mg Oral BID    Dialysis Orders: Center: [facility]  on TTS- last 11/03/2018. EDW 43.5kg, Time: 3.75 hours, 2K/ 2.5 Ca, BFR 400, DFR 600,  Dialyzer: [place] Revacir 200, LUE AVF Heparin 1000 unit load, 500 units/hour (stop 60 min before end of HD) Phoslo 667mg  2 tabs PO TID  Assessment/Plan: 1. Seizure: Known history of seizure disorder, on dilantin, keppra and topamax as outpatient.  Uremia from missed HD may be a big contributor to seizure activity. Pt improving clinical following HD. Labs improved.   Counseled pt about not missing HD sessions.   2.  ESRD:  TTS [facility]. Missed HD x 3 prior to admission. HD yesterday and today. Then back on schedule.  3.  Hypertension/volume: BP improving with UF. Not to EDW here.  4.  Anemia: Hemoglobin 11.0.  No indication for ESA at this time.  5.  Metabolic bone disease:  Ca ok. Phos improving. Continue binders  Nutrition:  Recommend renal diet with fluid restrictions.  6. Noncompliance with dialysis Rx. Missed HD x 3 prior to admission. Pt resides in [facility]. Unclear why she has been missing treatments. SW investigating.   Lynnda Child PA-C [facility] Pager [phone] 11/15/2018,10:01 AM  Pt seen, examined and agree w A/P as above.  Kelly Splinter  MD 11/15/2018, 12:51 PM

## 2018-11-15 NOTE — TOC Initial Note (Signed)
Transition of Care [facility]) - Initial/Assessment Note    Patient Details  Name: Erica [name] MRN: [id number] Date of Birth: 06-Jan-1956  Transition of Care [facility]) CM/SW Contact:    Sable Feil, LCSW Phone Number: 11/15/2018, 4:52 PM  Clinical Narrative: Talked with patient at bedside regarding her discharge disposition. Erica [name] was in bed and was alert, oriented and agreeable to talking with CSW. When asked, patient confirmed that she is from [facility], has been there a year or so, and plans return. Patient reported that her son lives in [place] and [place] lives In [place]. She requested that her son be contacted and her daughter can be called if her son cannot be reached.                 Expected Discharge Plan: [facility](Patient from [facility]) Barriers to Discharge: Continued Medical Work up   Patient Goals and CMS Choice Patient states their goals for this hospitalization and ongoing recovery are:: Patient reprted that she plans to return to [employer] at discharge [email or web address] Compare Post Acute Care list provided to:: Other (Comment Required)(Not needed as patient LTC resident at [facility]) Choice offered to / list presented to : NA  Expected Discharge Plan and Services Expected Discharge Plan: [facility](Patient from [facility]) In-house Referral: Clinical Social Work   Post Acute Care Choice: [place] arrangements for the past 2 months: [place] Expected Discharge Date: 11/15/18                                    Prior Living Arrangements/Services Living arrangements for the past 2 months: [place] Lives with:: Facility Resident Patient language and need for interpreter reviewed:: No Do you feel safe going back to the place where you live?: Yes      Need for Family Participation in Patient Care: Yes (Comment) Care giver support system in place?: Yes  (comment)   Criminal Activity/Legal Involvement Pertinent to Current Situation/Hospitalization: No - Comment as needed  Activities of Daily Living Home Assistive Devices/Equipment: [place] Rile (specify type) ADL Screening (condition at time of admission) Patient's cognitive ability adequate to safely complete daily activities?: Yes Is the patient deaf or have difficulty hearing?: No Does the patient have difficulty seeing, even when wearing glasses/contacts?: No Does the patient have difficulty concentrating, remembering, or making decisions?: No Patient able to express need for assistance with ADLs?: Yes Does the patient have difficulty dressing or bathing?: Yes Independently performs ADLs?: No Does the patient have difficulty walking or climbing stairs?: Yes Weakness of Legs: Both Weakness of Arms/Hands: Both  Permission Sought/Granted Permission sought to share information with : Family Supports Permission granted to share information with : Yes, Verbal Permission Granted  Share Information with NAME: Erica [name] or Erica [name]     Permission granted to share info w Relationship: Son and Daughter  Permission granted to share info w Contact Information: Son - [phone] and Daughter - [phone]  Emotional Assessment Appearance:: Appears older than stated age Attitude/Demeanor/Rapport: Engaged Affect (typically observed): Pleasant Orientation: : Oriented to Self, Oriented to Place, Oriented to  Time, Oriented to Situation Alcohol / Substance Use: Tobacco Use, Alcohol Use, Illicit Drugs(Per H&P patient has never smoked, drinks alcohol and does not use illicit drug) Psych Involvement: No (comment)  Admission diagnosis:  dialysis pt  Patient Active Problem List   Diagnosis  Date Noted  . Seizure ([place]) 11/12/2018  . History of hydronephrosis --stents in place 12/08/2017  . Stroke ([place]) 11/29/2017  . Seizures ([place])   . Hydronephrosis   . Recurrent UTI   . Diabetes mellitus  type 2 in nonobese (HCC)   . ESRD (end stage renal disease) ([place])   . Anemia of chronic disease   . Chronic diastolic congestive heart failure ([place])   . Essential hypertension   . PICC (peripherally inserted central catheter) in place   . Acute pyelonephritis 11/12/2017  . Uncontrolled type 2 diabetes mellitus with hyperglycemia, with long-term current use of insulin ([place]) 11/10/2017  . Renal failure 11/08/2017  . Seizure disorder ([place]) 11/08/2017  . Orthostatic hypotension 07/25/2014  . Moderate protein malnutrition ([place]) 09/15/2013  . Aspiration pneumonia ([place]) 09/15/2013  . Acute respiratory failure requiring reintubation ([place]) 09/11/2013  . probable Seizures due to metabolic disorder [id number]  . Lactic acidosis 03/19/2013  . Abdominal pain 03/19/2013  . Rotavirus infection 10/29/2012  . Type II or unspecified type diabetes mellitus without mention of complication, uncontrolled 10/29/2012  . Protein-calorie malnutrition, severe ([place]) 10/27/2012  . NSTEMI (non-ST elevated myocardial infarction) ([place]) 10/26/2012  . Fever, unspecified 10/26/2012  . Hypotension 10/25/2012  . Metabolic acidosis [id number]  . Chronic diarrhea 10/25/2012  . Tobacco abuse 10/25/2012  . DKA (diabetic ketoacidoses) ([place]) 09/09/2012  . Dehydration 09/09/2012  . DKA, type 2 ([place]) 05/20/2012  . Abnormal LFTs 05/20/2012  . Heart murmur, systolic [id number]  . Hypoglycemia 07/08/2011  . Metabolic encephalopathy [id number]  . Alcohol abuse 07/08/2011  . Hypokalemia 07/08/2011  . Nausea & vomiting 07/08/2011  . H/O chronic pancreatitis 07/08/2011   PCP:  Scotty Court, DO Pharmacy:   [facility] [place] - [address] [place] [address] Phone: [phone] Fax: [phone]    Social Determinants of Health (SDOH) Interventions  No SDOH interventions needed at this time   Readmission Risk Interventions No flowsheet data found.

## 2018-11-15 NOTE — Progress Notes (Signed)
[facility] PROGRESS NOTE  Erica [name] [id number] DOB: 1955-06-13 DOA: 11/12/2018 PCP: Scotty Court, DO  Brief summary   Erica [name] is an 63 y.o. female (lives at SNF) with medical history significant ofh/o alcohol-induced pancreatitis; seizures; HTN; HLD; DM: ESRD on HD; and diastolic CHF presenting with seizures.The patient reports feeling weak. She missed 3 episodes of HD recently, decided to go today and had a seizure in the waiting room.  Assessment/Plan:  Seizures -Patient with known seizure disorder presenting with recurrent seizures, likely associated with metabolic d/o in the setting of HD non-compliance (see below) -She takes Dilantin, Topamax, and Keppra (once daily?) and was loaded with a gram of Keppra at [facility] -s/p HD -no new seizures .  -neurology consult. EEG pending. Dilantin level at 6.6. per neurology: Continue Keppra 1,000mg  QAM. Start 500 mg of Keppra on days following dialysis treatments (Tue-Th-Sat). Continue Dilantin 150 mg BID. Continue Topamax 25 mg BID. Consider increasing to 50 mg twice daily after 2 weeks if she has another seizure.  ESRD with non-compliance with HD -She reports "family problems" that have prevented her from attending her 3 most recent HD sessions -Nephrology has seen the patient and she will be dialyzed 9/26 and again on 9/28, 9/29 -Continue Bicarb  Hyponatremia -improved with fluid removal  Low calcium -defer to renal  HTN -BP is not well controlled. Increased coreg   HLD -Continue Lipitor  DM -episode of hypoglycemia. D/c lantus 12U.  ha1c-6.6 -monitor on sensitive-scale SSI for now  Chronic diastolic CHF -Volume management with HD  Code Status: full Family Communication: d/w patient, Therapist, sports. Recently called updated her family (indicate person spoken with, relationship, and if by phone, the number) Disposition Plan: awaiting SNF.    Consultants:  Neurology   Nephrology    Procedures:  none  Antibiotics: Anti-infectives (From admission, onward)   None       (indicate start date, and stop date if known)  HPI/Subjective: Pt with episode of hypoglycemia yesterday. Resolved. Discontinued  insulin No acute distress. No new seizures. Reports feeling well.   Objective: Vitals:   11/14/18 2118 11/15/18 0408  BP: (!) 181/74 (!) 146/76  Pulse: 72 93  Resp: 15 16  Temp: (!) 97.5 F (36.4 C) 98.6 F (37 C)  SpO2: 100% 98%    Intake/Output Summary (Last 24 hours) at 11/15/2018 0827 Last data filed at 11/15/2018 0600 Gross per 24 hour  Intake 900 ml  Output 2300 ml  Net -1400 ml   Filed Weights   11/14/18 1150 11/14/18 1456 11/15/18 0408  Weight: 52.3 kg 49 kg 52.7 kg    Exam:   General:  No acute distress   Cardiovascular: s1,s2 rrr  Respiratory: CTA BL  Abdomen: soft, nt   Musculoskeletal: no leg edema    Data Reviewed: Basic Metabolic Panel: Recent Labs  Lab 11/12/18 1250 11/12/18 1257 11/12/18 1841 11/13/18 0401 11/14/18 1232  NA 122* 125* 124* 132* 130*  K 5.6* 6.0* 5.1 3.7 3.4*  CL 87* 82* 85* 92* 90*  CO2  --  17* 16* 23 23  GLUCOSE 221* 225* 145* 137* 134*  BUN 94* 100* 100* 43* 52*  CREATININE 9.30* 9.14* 9.28* 5.65* 7.00*  CALCIUM  --  7.1* 6.9* 7.7* 7.9*  MG  --  2.5*  --   --   --   PHOS  --  10.9* 11.1*  --  6.7*   Liver Function Tests: Recent Labs  Lab 11/12/18 1841 11/13/18 0401 11/14/18  1232  AST  --  61*  --   ALT  --  57*  --   ALKPHOS  --  638*  --   BILITOT  --  0.9  --   PROT  --  6.4*  --   ALBUMIN 3.1* 2.9* 2.8*   No results for input(s): LIPASE, AMYLASE in the last 168 hours. No results for input(s): AMMONIA in the last 168 hours. CBC: Recent Labs  Lab 11/12/18 1250 11/12/18 1841 11/13/18 0401 11/14/18 1232  WBC  --  7.5 8.5 6.6  NEUTROABS  --  4.9  --   --   HGB 12.6 10.6* 11.0* 11.0*  HCT 37.0 31.4* 32.4* 32.6*  MCV  --  101.9* 101.3* 102.8*  PLT  --  242 226 243    Cardiac Enzymes: No results for input(s): CKTOTAL, CKMB, CKMBINDEX, TROPONINI in the last 168 hours. BNP (last 3 results) No results for input(s): BNP in the last 8760 hours.  ProBNP (last 3 results) No results for input(s): PROBNP in the last 8760 hours.  CBG: Recent Labs  Lab 11/15/18 0119 11/15/18 0139 11/15/18 0155 11/15/18 0334 11/15/18 0655  GLUCAP 47* 57* 74 131* 204*    Recent Results (from the past 240 hour(s))  SARS Coronavirus 2 [facility] order, Performed in [facility] hospital lab) Nasopharyngeal Nasopharyngeal Swab     Status: None   Collection Time: 11/12/18  1:02 PM   Specimen: Nasopharyngeal Swab  Result Value Ref Range Status   SARS Coronavirus 2 NEGATIVE NEGATIVE Final    Comment: (NOTE) If result is NEGATIVE SARS-CoV-2 target nucleic acids are NOT DETECTED. The SARS-CoV-2 RNA is generally detectable in upper and lower  respiratory specimens during the acute phase of infection. The lowest  concentration of SARS-CoV-2 viral copies this assay can detect is 250  copies / mL. A negative result does not preclude SARS-CoV-2 infection  and should not be used as the sole basis for treatment or other  patient management decisions.  A negative result may occur with  improper specimen collection / handling, submission of specimen other  than nasopharyngeal swab, presence of viral mutation(s) within the  areas targeted by this assay, and inadequate number of viral copies  (<250 copies / mL). A negative result must be combined with clinical  observations, patient history, and epidemiological information. If result is POSITIVE SARS-CoV-2 target nucleic acids are DETECTED. The SARS-CoV-2 RNA is generally detectable in upper and lower  respiratory specimens dur ing the acute phase of infection.  Positive  results are indicative of active infection with SARS-CoV-2.  Clinical  correlation with patient history and other diagnostic information is  necessary to  determine patient infection status.  Positive results do  not rule out bacterial infection or co-infection with other viruses. If result is PRESUMPTIVE POSTIVE SARS-CoV-2 nucleic acids MAY BE PRESENT.   A presumptive positive result was obtained on the submitted specimen  and confirmed on repeat testing.  While 2019 novel coronavirus  (SARS-CoV-2) nucleic acids may be present in the submitted sample  additional confirmatory testing may be necessary for epidemiological  and / or clinical management purposes  to differentiate between  SARS-CoV-2 and other Sarbecovirus currently known to infect humans.  If clinically indicated additional testing with an alternate test  methodology [phone]) is advised. The SARS-CoV-2 RNA is generally  detectable in upper and lower respiratory sp ecimens during the acute  phase of infection. The expected result is Negative. Fact Sheet for Patients:  StrictlyIdeas.no Fact  Sheet for Healthcare Providers: BankingDealers.co.za This test is not yet approved or cleared by the [place] and has been authorized for detection and/or diagnosis of SARS-CoV-2 by FDA under an Emergency Use Authorization (EUA).  This EUA will remain in effect (meaning this test can be used) for the duration of the COVID-19 declaration under Section 564(b)(1) of the Act, 21 U.S.C. section 360bbb-3(b)(1), unless the authorization is terminated or revoked sooner. Performed at [facility], [place] [address] [place], [place] [address]   MRSA PCR Screening     Status: None   Collection Time: 11/14/18  6:10 AM   Specimen: Nasal Mucosa; Nasopharyngeal  Result Value Ref Range Status   MRSA by PCR NEGATIVE NEGATIVE Final    Comment:        The GeneXpert MRSA Assay (FDA approved for NASAL specimens only), is one component of a comprehensive MRSA colonization surveillance program. It is not intended to diagnose MRSA infection nor  to guide or monitor treatment for MRSA infections. Performed at [facility], [place] [address] [place], [place] [address]      Studies: No results found.  Scheduled Meds: . atorvastatin  40 mg Oral QPM  . calcitRIOL  0.25 mcg Oral Q T,Th,Sa-HD  . calcium acetate  1,334 mg Oral TID WC  . carvedilol  6.25 mg Oral BID  . Chlorhexidine Gluconate Cloth  6 each Topical Q0600  . dextrose      . ferric citrate  420 mg Oral TID WC  . folic acid  1 mg Oral q morning - 10a  . heparin  5,000 Units Subcutaneous Q8H  . levETIRAcetam  1,000 mg Oral q morning - 10a  . levETIRAcetam  500 mg Oral Q T,Th,Sa-HD  . phenytoin  150 mg Oral BID  . sodium bicarbonate  650 mg Oral QID  . topiramate  25 mg Oral BID   Continuous Infusions:  Principal Problem:   Seizure (HCC) Active Problems:   Chronic diastolic congestive heart failure (HCC)   Essential hypertension   Seizures ([place])   Diabetes mellitus type 2 in nonobese ([place])   ESRD (end stage renal disease) ([place])    Time spent: >25 minutes     Kinnie Feil  [facility] Pager [phone]. If 7PM-7AM, please contact night-coverage at [email or web address], password [facility] 11/15/2018, 8:27 AM  LOS: 2 days

## 2018-11-15 NOTE — Progress Notes (Signed)
Hypoglycemic Event  CBG: Results for LOUBERTHA, BELD (MRN [id number]) as of 11/15/2018 01:48  Ref. Range 11/15/2018 01:39  Glucose-Capillary Latest Ref Range: 70 - 99 mg/dL 57 (L)    Treatment: 1 tube glucose gel  Symptoms: Nervous/irritable  Follow-up CBG: Time: CBG Result:  Results for JAKIRIA, ORTLIEB (MRN [id number]) as of 11/15/2018 02:00  Ref. Range 11/15/2018 01:55  Glucose-Capillary Latest Ref Range: 70 - 99 mg/dL 74   Possible Reasons for Event: Inadequate meal intake  Comments/MD notified:    Viviano Simas

## 2018-11-15 NOTE — Progress Notes (Signed)
OT Cancellation Note  Patient Details Name: Erica [name] MRN: [id number] DOB: 17-Jul-1955   Cancelled Treatment:    Reason Eval/Treat Not Completed: Patient at procedure or test/ unavailable (HD). Will follow up for OT eval as schedule permits.  Lou Cal, OT [facility] Pager [phone] Office [place] 11/15/2018, 12:30 PM

## 2018-11-16 LAB — GLUCOSE, CAPILLARY
Glucose-Capillary: 159 mg/dL — ABNORMAL HIGH (ref 70–99)
Glucose-Capillary: 286 mg/dL — ABNORMAL HIGH (ref 70–99)

## 2018-11-16 NOTE — Progress Notes (Signed)
Renal Navigator faxed records from this hospitalization to OP HD clinic/[facility] in order to provide continuity of care.  Alphonzo Cruise, [place] Renal Navigator [phone]

## 2018-11-16 NOTE — NC FL2 (Signed)
[place] MEDICAID FL2 LEVEL OF CARE SCREENING TOOL     IDENTIFICATION  Patient Name: Erica [name] Birthdate: 1955-07-20 Sex: female Admission Date (Current Location): 11/12/2018  [place] and [place] Number:  [place] [id number] [place] and Address:  [facility] [place], [place] [address] [place],  [address]      Provider Number: [id number]  Attending Physician Name and Address:  Bonnell Public, MD  Relative Name and Phone Number:  [name] - son, [phone]; [name] - dau. [phone]    Current Level of Care: Hospital Recommended Level of Care: [place] Prior Approval Number:    Date Approved/Denied:   PASRR Number:    Discharge Plan: SNF    Current Diagnoses: Patient Active Problem List   Diagnosis Date Noted  . Seizure ([place]) 11/12/2018  . History of hydronephrosis --stents in place 12/08/2017  . Stroke ([place]) 11/29/2017  . Seizures ([place])   . Hydronephrosis   . Recurrent UTI   . Diabetes mellitus type 2 in nonobese (HCC)   . ESRD (end stage renal disease) ([place])   . Anemia of chronic disease   . Chronic diastolic congestive heart failure ([place])   . Essential hypertension   . PICC (peripherally inserted central catheter) in place   . Acute pyelonephritis 11/12/2017  . Uncontrolled type 2 diabetes mellitus with hyperglycemia, with long-term current use of insulin ([place]) 11/10/2017  . Renal failure 11/08/2017  . Seizure disorder ([place]) 11/08/2017  . Orthostatic hypotension 07/25/2014  . Moderate protein malnutrition ([place]) 09/15/2013  . Aspiration pneumonia ([place]) 09/15/2013  . Acute respiratory failure requiring reintubation ([place]) 09/11/2013  . probable Seizures due to metabolic disorder [id number]  . Lactic acidosis 03/19/2013  . Abdominal pain 03/19/2013  . Rotavirus infection 10/29/2012  . Type II or unspecified type diabetes mellitus without mention of complication, uncontrolled 10/29/2012  .  Protein-calorie malnutrition, severe ([place]) 10/27/2012  . NSTEMI (non-ST elevated myocardial infarction) ([place]) 10/26/2012  . Fever, unspecified 10/26/2012  . Hypotension 10/25/2012  . Metabolic acidosis [id number]  . Chronic diarrhea 10/25/2012  . Tobacco abuse 10/25/2012  . DKA (diabetic ketoacidoses) ([place]) 09/09/2012  . Dehydration 09/09/2012  . DKA, type 2 ([place]) 05/20/2012  . Abnormal LFTs 05/20/2012  . Heart murmur, systolic [id number]  . Hypoglycemia 07/08/2011  . Metabolic encephalopathy [id number]  . Alcohol abuse 07/08/2011  . Hypokalemia 07/08/2011  . Nausea & vomiting 07/08/2011  . H/O chronic pancreatitis 07/08/2011    Orientation RESPIRATION BLADDER Height & Weight     Self, Time, Situation, Place  Normal Continent Weight: 114 lb 13.8 oz (52.1 kg) Height:  4\' 11"  (149.9 cm)  BEHAVIORAL SYMPTOMS/MOOD NEUROLOGICAL BOWEL NUTRITION STATUS      Continent Diet(Renal - Carb modified)  AMBULATORY STATUS COMMUNICATION OF NEEDS Skin   Limited Assist Verbally Normal                       Personal Care Assistance Level of Assistance  Bathing, Feeding, Dressing Bathing Assistance: Limited assistance Feeding assistance: Independent Dressing Assistance: Limited assistance     Functional Limitations Info  Sight, Hearing, Speech Sight Info: Adequate Hearing Info: Adequate Speech Info: Adequate    SPECIAL CARE FACTORS FREQUENCY  PT (By licensed PT)     PT Frequency: Evaluated 9/28 at hospital              Contractures Contractures Info: Not present    Additional Factors Info  Allergies   Allergies Info: Aspirin  Current Medications (11/16/2018):  This is the current hospital active medication list Current Facility-Administered Medications  Medication Dose Route Frequency Provider Last Rate Last Dose  . acetaminophen (TYLENOL) tablet 650 mg  650 mg Oral Q6H PRN Karmen Bongo, MD       Or  . acetaminophen (TYLENOL) suppository 650 mg  650  mg Rectal Q6H PRN Karmen Bongo, MD      . albuterol (PROVENTIL) (2.5 MG/3ML) 0.083% nebulizer solution 2.5 mg  2.5 mg Inhalation Q4H PRN Karmen Bongo, MD      . atorvastatin (LIPITOR) tablet 40 mg  40 mg Oral QPM Karmen Bongo, MD   40 mg at 11/15/18 1707  . calcitRIOL (ROCALTROL) capsule 0.25 mcg  0.25 mcg Oral Q T,Th,Sa-HD Karmen Bongo, MD   0.25 mcg at 11/15/18 1348  . calcium acetate (PHOSLO) capsule 1,334 mg  1,334 mg Oral TID WC Karmen Bongo, MD   1,334 mg at 11/16/18 1152  . calcium carbonate (dosed in mg elemental calcium) suspension 500 mg of elemental calcium  500 mg of elemental calcium Oral Q6H PRN Karmen Bongo, MD      . camphor-menthol [facility]) lotion 1 application  1 application Topical [id number] PRN Karmen Bongo, MD       And  . hydrOXYzine (ATARAX/VISTARIL) tablet 25 mg  25 mg Oral Q8H PRN Karmen Bongo, MD      . carvedilol (COREG) tablet 6.25 mg  6.25 mg Oral BID Kinnie Feil, MD   6.25 mg at 11/16/18 0839  . Chlorhexidine Gluconate Cloth 2 % PADS 6 each  6 each Topical Q0600 Collins, Hervey Ard, PA-C      . docusate sodium (ENEMEEZ) enema 283 mg  1 enema Rectal PRN Karmen Bongo, MD      . feeding supplement (NEPRO CARB STEADY) liquid 237 mL  237 mL Oral TID PRN Karmen Bongo, MD      . ferric citrate (AURYXIA) tablet 420 mg  420 mg Oral TID WC Karmen Bongo, MD   420 mg at 11/16/18 1152  . folic acid (FOLVITE) tablet 1 mg  1 mg Oral q morning - 10a Karmen Bongo, MD   1 mg at 11/16/18 0840  . heparin injection 5,000 Units  5,000 Units Subcutaneous Lynne Logan, MD   5,000 Units at 11/16/18 [phone]  . hydrALAZINE (APRESOLINE) injection 5 mg  5 mg Intravenous Q8H PRN Eulogio Bear U, DO      . levETIRAcetam (KEPPRA) tablet 1,000 mg  1,000 mg Oral q morning - 10a Karmen Bongo, MD   1,000 mg at 11/16/18 [id number]  . levETIRAcetam (KEPPRA) tablet 500 mg  500 mg Oral Q T,Th,Sa-HD Vann, Jessica U, DO      . LORazepam (ATIVAN) injection 1-2 mg  1-2 mg  Intravenous Q2H PRN Karmen Bongo, MD   1 mg at 11/13/18 1044  . ondansetron (ZOFRAN) tablet 4 mg  4 mg Oral Q6H PRN Karmen Bongo, MD       Or  . ondansetron [facility]) injection 4 mg  4 mg Intravenous Q6H PRN Karmen Bongo, MD      . oxyCODONE (Oxy IR/ROXICODONE) immediate release tablet 2.5 mg  2.5 mg Oral Q12H PRN Karmen Bongo, MD      . phenytoin (DILANTIN) 125 MG/5ML suspension 150 mg  150 mg Oral BID Karmen Bongo, MD   150 mg at 11/16/18 0840  . sodium bicarbonate tablet 650 mg  650 mg Oral QID Karmen Bongo, MD   650 mg at 11/16/18 0839  .  sorbitol 70 % solution 30 mL  30 mL Oral PRN Karmen Bongo, MD      . topiramate (TOPAMAX) tablet 25 mg  25 mg Oral BID Karmen Bongo, MD   25 mg at 11/16/18 [id number]     Discharge Medications: Please see discharge summary for a list of discharge medications.  Relevant Imaging Results:  Relevant Lab Results:   Additional Information [phone]. Dialysis patient - TTS [facility]ine Bare, Mila Homer, LCSW

## 2018-11-16 NOTE — Progress Notes (Signed)
Renal Navigator received call from [facility] asking for an update on patient's discharge. Navigator asked [name] to contact CSW/V. Crawford for follow up.  Alphonzo Cruise, [place] Renal Navigator [phone]

## 2018-11-16 NOTE — Progress Notes (Signed)
Erica [name] to be discharged to [facility] per MD order. Patient verbalized understanding.  Skin clean, dry and intact without evidence of skin break down, no evidence of skin tears noted. IV catheter discontinued intact. Site without signs and symptoms of complications. Dressing and pressure applied. Pt denies pain at the site currently. No complaints noted.  Patient free of lines, drains, and wounds.   Discharge packet assembled. An After Visit Summary (AVS) was printed and given to the EMS personnel. Patient escorted via stretcher and discharged to [facility] via ambulance. Report called to accepting facility; all questions and concerns addressed.   Shela Commons, RN

## 2018-11-16 NOTE — Progress Notes (Signed)
[facility] Progress Note   Subjective:    Seen in room. Completed HD yesterday. Feels well today, no complaints. Awaiting discharge back to SNF.   Objective Vitals:   11/15/18 1500 11/15/18 2113 11/16/18 0530 11/16/18 0848  BP: (!) 149/67 (!) 160/64 (!) 156/60 (!) 160/70  Pulse: 98 95 91 75  Resp: 18 16 14 18   Temp: 99.2 F (37.3 C) 98.1 F (36.7 C) 98.3 F (36.8 C) 98 F (36.7 C)  TempSrc: Oral Oral Oral Oral  SpO2: 97% 99% 98% 98%  Weight:  52.1 kg    Height:       Physical Exam General: Well developed, frail appearing female in NAD Heart: RRR, no murmurs, rubs or gallops  Lungs: CTAB No wheezing auscultated. Unlabored on RA Abdomen: Soft, non-tender, non-distended. +BS Extremities: No LE edema Dialysis Access: LUE AVF  + bruit  Additional Objective Labs: Basic Metabolic Panel: Recent Labs  Lab 11/12/18 1257 11/12/18 1841 11/13/18 0401 11/14/18 1232  NA 125* 124* 132* 130*  K 6.0* 5.1 3.7 3.4*  CL 82* 85* 92* 90*  CO2 17* 16* 23 23  GLUCOSE 225* 145* 137* 134*  BUN 100* 100* 43* 52*  CREATININE 9.14* 9.28* 5.65* 7.00*  CALCIUM 7.1* 6.9* 7.7* 7.9*  PHOS 10.9* 11.1*  --  6.7*   Liver Function Tests: Recent Labs  Lab 11/12/18 1841 11/13/18 0401 11/14/18 1232  AST  --  61*  --   ALT  --  57*  --   ALKPHOS  --  638*  --   BILITOT  --  0.9  --   PROT  --  6.4*  --   ALBUMIN 3.1* 2.9* 2.8*   No results for input(s): LIPASE, AMYLASE in the last 168 hours. CBC: Recent Labs  Lab 11/12/18 1841 11/13/18 0401 11/14/18 1232  WBC 7.5 8.5 6.6  NEUTROABS 4.9  --   --   HGB 10.6* 11.0* 11.0*  HCT 31.4* 32.4* 32.6*  MCV 101.9* 101.3* 102.8*  PLT 242 226 243   Blood Culture    Component Value Date/Time   SDES URINE, CATHETERIZED 12/10/2017 0849   SPECREQUEST NONE 12/10/2017 0849   CULT  12/10/2017 0849    NO GROWTH Performed at [facility], [place] [address], [place], [place] [address]    REPTSTATUS 12/11/2017 FINAL 12/10/2017  0849    Cardiac Enzymes: No results for input(s): CKTOTAL, CKMB, CKMBINDEX, TROPONINI in the last 168 hours. CBG: Recent Labs  Lab 11/15/18 0334 11/15/18 0655 11/15/18 1653 11/15/18 2110 11/16/18 0657  GLUCAP 131* 204* 135* 215* 159*   Iron Studies: No results for input(s): IRON, TIBC, TRANSFERRIN, FERRITIN in the last 72 hours. @lablastinr3 @ Studies/Results: No results found. Medications:  . atorvastatin  40 mg Oral QPM  . calcitRIOL  0.25 mcg Oral Q T,Th,Sa-HD  . calcium acetate  1,334 mg Oral TID WC  . carvedilol  6.25 mg Oral BID  . Chlorhexidine Gluconate Cloth  6 each Topical Q0600  . ferric citrate  420 mg Oral TID WC  . folic acid  1 mg Oral q morning - 10a  . heparin  5,000 Units Subcutaneous Q8H  . levETIRAcetam  1,000 mg Oral q morning - 10a  . levETIRAcetam  500 mg Oral Q T,Th,Sa-HD  . phenytoin  150 mg Oral BID  . sodium bicarbonate  650 mg Oral QID  . topiramate  25 mg Oral BID    Dialysis Orders: Center: [facility]  on TTS- last 11/03/2018.  EDW 43.5kg, Time: 3.75 hours, 2K/ 2.5 Ca, BFR 400, DFR 600,  Dialyzer: [place] Revacir 200, LUE AVF Heparin 1000 unit load, 500 units/hour (stop 60 min before end of HD) Phoslo 667mg  2 tabs PO TID  Assessment/Plan: 1. Seizure: Known history of seizure disorder, on dilantin, keppra and topamax as outpatient.  Uremia from missed HD may be a big contributor to seizure activity. Pt improving clinical following HD. Labs improved.   Counseled pt about not missing HD sessions.   2.  ESRD:  TTS [facility]. Missed HD x 3 prior to admission. Had serial HD here. Now back on schedule. Next HD 10/1 3.  Hypertension/volume: BP improving with UF. Not to EDW here.  4.  Anemia: Hemoglobin 11.0.  No indication for ESA at this time.  5.  Metabolic bone disease:  Ca ok. Phos improving. Continue binders  Nutrition:  Recommend renal diet with fluid restrictions.  6. Noncompliance with dialysis Rx. Missed HD x 3 prior to admission. Pt resides in  [facility]. Unclear why she has been missing treatments.Has been counseled.  [place] for discharge from renal standpoint.    Lynnda Child PA-C [facility] Pager [phone] 11/16/2018,10:59 AM

## 2018-11-16 NOTE — Evaluation (Signed)
Occupational Therapy Evaluation Patient Details Name: Erica [name] MRN: [id number] DOB: Oct 13, 1955 Today's Date: 11/16/2018    History of Present Illness 63 yo female with onset of suspected seizures was admitted, noted her refusal of HD in the last week.  Meds for seizures managed, had clear EEG and note elevated creatinine.  Has also frequent diarrhea, has mild fluid overload.  PMHx:  ESRD, LUE HD port, CHF, pancreatitis, HTN, seizures, DM   Clinical Impression   Pt is a resident of a SNF in [place], [place] and reports that she was independent with her ADLs/selfcrae and used a RW PTA. Pt currently requires set up with UB ADLs, min guard A with toileting and transfers. Pt will return to her SNF after acute d/c and no fruter acute OT is indicated at this time. Any further OT intervention will be deferred to the SNF    Follow Up Recommendations  SNF    Equipment Recommendations  None recommended by OT    Recommendations for Other Services       Precautions / Restrictions Precautions Precautions: Fall Restrictions Weight Bearing Restrictions: No      Mobility Bed Mobility               General bed mobility comments: pt sitting EOB upon arrival  Transfers Overall transfer level: Needs assistance Equipment used: Rolling walker (2 wheeled);1 person hand held assist Transfers: Sit to/from Stand Sit to Stand: Min assist;Min guard         General transfer comment: cues for correct hand placement    Balance Overall balance assessment: Needs assistance Sitting-balance support: Feet supported Sitting balance-Leahy Scale: Good     Standing balance support: Bilateral upper extremity supported;During functional activity Standing balance-Leahy Scale: Poor                             ADL either performed or assessed with clinical judgement   ADL Overall ADL's : Needs assistance/impaired Eating/Feeding: Independent;Sitting   Grooming: Wash/dry  hands;Wash/dry face;Standing   Upper Body Bathing: Independent;Sitting;Set up   Lower Body Bathing: Min guard;Sit to/from stand   Upper Body Dressing : Independent;Set up;Sitting   Lower Body Dressing: Min guard;Sit to/from stand   Toilet Transfer: Minimal assistance;Min guard;Cueing for safety;Ambulation;RW;Comfort height toilet;Grab bars   Toileting- Clothing Manipulation and Hygiene: Min guard;Sit to/from stand       Functional mobility during ADLs: Minimal assistance;Min guard;Rolling walker;Cueing for safety       Vision Baseline Vision/History: No visual deficits       Perception     Praxis      Pertinent Vitals/Pain Pain Assessment: No/denies pain     Hand Dominance Right   Extremity/Trunk Assessment Upper Extremity Assessment Upper Extremity Assessment: Generalized weakness   Lower Extremity Assessment Lower Extremity Assessment: Defer to PT evaluation   Cervical / Trunk Assessment Cervical / Trunk Assessment: Kyphotic   Communication Communication Communication: No difficulties   Cognition Arousal/Alertness: Awake/alert Behavior During Therapy: WFL for tasks assessed/performed Overall Cognitive Status: No family/caregiver present to determine baseline cognitive functioning                                     General Comments       Exercises     Shoulder Instructions      Home Living Family/patient expects to be discharged to:: Skilled nursing facility Living Arrangements:  Children Available Help at Discharge: [place];Available 24 hours/day Type of Home: Apartment Home Access: Level entry     Home Layout: One level     Bathroom Shower/Tub: Occupational psychologist: Standard     Home Equipment: Environmental consultant - 2 wheels   Additional Comments: pt was last in [place] and will need to increase independent movement to go home      Prior Functioning/Environment Level of Independence: Independent  with assistive device(s)        Comments: pt reports that she was indpendent with ADLs/selfcare but used a RW when she needed to        OT Problem List: Decreased activity tolerance;Decreased cognition;Impaired balance (sitting and/or standing);Decreased safety awareness      OT Treatment/Interventions:      OT Goals(Current goals can be found in the care plan section) Acute Rehab OT Goals Patient Stated Goal: go home OT Goal Formulation: With patient  OT Frequency:     Barriers to D/C:    no barriers       Co-evaluation              AM-PAC OT "6 Clicks" Daily Activity     Outcome Measure Help from another person eating meals?: None Help from another person taking care of personal grooming?: A Little Help from another person toileting, which includes using toliet, bedpan, or urinal?: A Little Help from another person bathing (including washing, rinsing, drying)?: A Little Help from another person to put on and taking off regular upper body clothing?: None Help from another person to put on and taking off regular lower body clothing?: A Little 6 Click Score: 20   End of Session Equipment Utilized During Treatment: Gait belt;Rolling walker  Activity Tolerance: Patient tolerated treatment well Patient left: in bed;Other (comment);with call bell/phone within reach(sitting EOB)  OT Visit Diagnosis: Unsteadiness on feet (R26.81);Muscle weakness (generalized) (M62.81)                Time: [id number] OT Time Calculation (min): 21 min Charges:  OT General Charges $OT Visit: 1 Visit OT Evaluation $OT Eval Moderate Complexity: 1 Mod    Britt Bottom 11/16/2018, 2:10 PM

## 2018-11-16 NOTE — Discharge Summary (Signed)
Physician Discharge Summary  Patient ID: CAPRI HUPPERT MRN: [id number] DOB/AGE: 04/29/1955 63 y.o.  Admit date: 11/12/2018 Discharge date: 11/16/2018  Admission Diagnoses:  Discharge Diagnoses:  Principal Problem:   Seizure ([place])   Hypoglycemia Active Problems:   Chronic diastolic congestive heart failure ([place])   Essential hypertension   Seizures (HCC)   Diabetes mellitus type 2 in nonobese ([place])   ESRD (end stage renal disease) ([place])   Discharged Condition: stable  Hospital Course: Patient is a 64 year old African-American female, skilled nursing facility resident with past medical history significant for end-stage renal disease on hemodialysis, diastolic congestive heart failure, diabetes mellitus, hyperlipidemia, hypertension and alcohol induced pancreatitis.  Patient was admitted with seizure.  Apparently, patient's blood sugar was noted to be intermittently low, and patient had missed hemodialysis on 3 occasions.  Patient was transferred to [facility] for further assessment and management.  Patient's care will was directed by the neurology and nephrology team.  Patient has been cleared for discharge.  Seizures: -Patient was on Dilantin, Topamax, and Keppra(once daily?)and was loaded with a gram of Keppra at [facility].  -Neurology team was consulted on arrival to [facility].   -Dilantin level was noted to be 6.6.  Patient was started on Keppra 500 mg Tuesday Thursday and Saturdays after hemodialysis management.  Dilantin and Topamax were continued.   - EEG done was within normal range.  -Patient has been cleared for discharge by the neurology team.  ESRD with non-compliance with HD: -Hemodialysis management was directed by the nephrology team during the hospital stay.  Hyponatremia -Continue to monitor closely.  Sodium level on 11/14/2018 was 130.  Hypertension:  -Continue to monitor closely and optimize.   Hyperlipidemia: -Continue  Lipitor  Diabetes mellitus with intermittent episodes of hypoglycemia: -Monitor blood sugar closely. -Noncompliance with hemodialysis may have contributed to the hypoglycemic episodes as well.  Chronic diastolic congestive heart failure:  -Volume management with HD -Optimize.  Noncompliance: Patient was counseled.  Consults: nephrology and neurology  Significant Diagnostic Studies: CT head stroke protocol revealed: 1. Atrophy and small vessel disease, mildly progressed from 2015. No acute intracranial findings. No signs of large vessel occlusion. 2. ASPECTS is 10.  Blood sugar monitoring during hospital stay revealed intermittent episodes of significant hypoglycemia.  Discharge Exam: Blood pressure (!) 160/70, pulse 75, temperature 98 F (36.7 C), temperature source Oral, resp. rate 18, height 4\' 11"  (1.499 m), weight 52.1 kg, SpO2 98 %.  Disposition: Discharge disposition: 03-Skilled Nursing Facility  Discharge Instructions    Diet - low sodium heart healthy   Complete by: As directed    Increase activity slowly   Complete by: As directed      Allergies as of 11/16/2018      Reactions   Aspirin Palpitations   Listed on [facility] 11/12/18      Medication List    STOP taking these medications   insulin aspart 100 UNIT/ML injection Commonly known as: novoLOG   oxyCODONE 5 MG immediate release tablet Commonly known as: Oxy IR/ROXICODONE     TAKE these medications   acetaminophen 325 MG tablet Commonly known as: TYLENOL Take 1-2 tablets (325-650 mg total) by mouth every 4 (four) hours as needed for mild pain. What changed:   how much to take  when to take this  additional instructions   atorvastatin 40 MG tablet Commonly known as: LIPITOR Take 40 mg by mouth every evening. For high cholesterol   calcitRIOL 0.25 MCG capsule Commonly known as: ROCALTROL  Take 1 capsule (0.25 mcg total) by mouth Every Tuesday,Thursday,and Saturday with dialysis.   carvedilol  6.25 MG tablet Commonly known as: COREG Take 1 tablet (6.25 mg total) by mouth 2 (two) times daily. What changed:   medication strength  how much to take  additional instructions   dextrose 40 % Gel Commonly known as: GLUTOSE Take 1 Tube by mouth every 10 (ten) minutes as needed for low blood sugar (unitl blood sugar is above 70).   feeding supplement (NEPRO CARB STEADY) Liqd Take 237 mLs by mouth 3 (three) times daily as needed (Supplement).   ferric citrate 1 GM 210 MG(Fe) tablet Commonly known as: AURYXIA Take 2 tablets (420 mg total) by mouth 3 (three) times daily with meals.   folic acid 1 MG tablet Commonly known as: FOLVITE Take 1 mg by mouth every morning.   insulin lispro 100 UNIT/ML injection Commonly known as: HUMALOG Inject 2-10 Units into the skin 4 (four) times daily. Per sliding scale: CBG 151-200 2 units, 201-250 4 units, 251-300 6 units, 301-350 8 units, 351-400 10 units,   levETIRAcetam 1000 MG tablet Commonly known as: KEPPRA Take 1,000 mg by mouth every morning. What changed: Another medication with the same name was added. Make sure you understand how and when to take each.   levETIRAcetam 500 MG tablet Commonly known as: KEPPRA Take 1 tablet (500 mg total) by mouth Every Tuesday,Thursday,and Saturday with dialysis. 500 mg, Oral, Every T-Th-Sa (Hemodialysis), First dose (after last modification) on Tue 11/15/18 at 1200, What changed: You were already taking a medication with the same name, and this prescription was added. Make sure you understand how and when to take each.   phenytoin 125 MG/5ML suspension Commonly known as: DILANTIN Take 6 mLs (150 mg total) by mouth 2 (two) times daily. What changed:   how much to take  additional instructions   ProAir HFA 108 (90 Base) MCG/ACT inhaler Generic drug: albuterol Inhale 2 puffs into the lungs every 4 (four) hours as needed for wheezing or shortness of breath.   sodium bicarbonate 650 MG  tablet Take 650 mg by mouth 4 (four) times daily. For indigestion - midnight, 6am, noon and 6pm   topiramate 25 MG tablet Commonly known as: TOPAMAX Take 1 tablet (25 mg total) by mouth 2 (two) times daily. What changed: additional instructions   Vitamin D (Ergocalciferol) 1.25 MG (50000 UT) Caps capsule Commonly known as: DRISDOL Take 50,000 Units by mouth every Friday.        SignedBonnell Public 11/16/2018, 1:32 PM

## 2018-11-16 NOTE — TOC Transition Note (Signed)
Transition of Care [facility]) - CM/SW Discharge Note *Patient discharged back to [facility] via ambulance   Patient Details  Name: Erica [name] MRN: [id number] Date of Birth: 02-21-1955  Transition of Care [facility]) CM/SW Contact:  Sable Feil, LCSW Phone Number: 11/16/2018, 4:26 PM   Clinical Narrative:  Patient medically stable for discharge back to [facility] today. Facility admissions director advised of readiness for discharge and dc clinicals transmitted to facility. Talked with patient and she indicated that her daughter wanted to take her to the facility. Contacted Shannon, admissions director and they are not allowing family to transport residents at this time. Erica [name] advised and daughter [name] [phone]) called and message left. Patient transported to facility by non-emergency transport.   Final next level of care: [facility](Patient plans to return to facility) Barriers to Discharge: Continued Medical Work up   Patient Goals and CMS Choice Patient states their goals for this hospitalization and ongoing recovery are:: Patient reprted that she plans to return to [facility] at discharge [email or web address] Compare Post Acute Care list provided to:: Other (Comment Required)(Not needed as patient LTC resident at [facility]) Choice offered to / list presented to : NA  Discharge Placement                       Discharge Plan and Services In-house Referral: Clinical Social Work   Post Acute Care Choice: Nursing Home                              Social Determinants of Health (SDOH) Interventions  No SDOH interventions needed   Readmission Risk Interventions No flowsheet data found.

## 2018-12-12 ENCOUNTER — Other Ambulatory Visit: Payer: Self-pay

## 2018-12-12 ENCOUNTER — Encounter (HOSPITAL_COMMUNITY): Payer: Self-pay

## 2018-12-12 ENCOUNTER — Emergency Department (HOSPITAL_COMMUNITY): Payer: Medicaid Other

## 2018-12-12 ENCOUNTER — Emergency Department (HOSPITAL_COMMUNITY)
Admission: EM | Admit: 2018-12-12 | Discharge: 2018-12-12 | Disposition: A | Discharge status: Home / Self Care | Payer: Medicaid Other | Attending: Emergency Medicine | Admitting: Emergency Medicine

## 2018-12-12 DIAGNOSIS — W010XXA Fall on same level from slipping, tripping and stumbling without subsequent striking against object, initial encounter: Secondary | ICD-10-CM | POA: Diagnosis not present

## 2018-12-12 DIAGNOSIS — Z8673 Personal history of transient ischemic attack (TIA), and cerebral infarction without residual deficits: Secondary | ICD-10-CM | POA: Insufficient documentation

## 2018-12-12 DIAGNOSIS — E1122 Type 2 diabetes mellitus with diabetic chronic kidney disease: Secondary | ICD-10-CM | POA: Insufficient documentation

## 2018-12-12 DIAGNOSIS — Y999 Unspecified external cause status: Secondary | ICD-10-CM | POA: Insufficient documentation

## 2018-12-12 DIAGNOSIS — Z992 Dependence on renal dialysis: Secondary | ICD-10-CM | POA: Insufficient documentation

## 2018-12-12 DIAGNOSIS — N186 End stage renal disease: Secondary | ICD-10-CM | POA: Diagnosis not present

## 2018-12-12 DIAGNOSIS — Z794 Long term (current) use of insulin: Secondary | ICD-10-CM | POA: Insufficient documentation

## 2018-12-12 DIAGNOSIS — I252 Old myocardial infarction: Secondary | ICD-10-CM | POA: Diagnosis not present

## 2018-12-12 DIAGNOSIS — Z79899 Other long term (current) drug therapy: Secondary | ICD-10-CM | POA: Insufficient documentation

## 2018-12-12 DIAGNOSIS — I5032 Chronic diastolic (congestive) heart failure: Secondary | ICD-10-CM | POA: Insufficient documentation

## 2018-12-12 DIAGNOSIS — R519 Headache, unspecified: Secondary | ICD-10-CM | POA: Insufficient documentation

## 2018-12-12 DIAGNOSIS — Y92121 Bathroom in nursing home as the place of occurrence of the external cause: Secondary | ICD-10-CM | POA: Insufficient documentation

## 2018-12-12 DIAGNOSIS — Z96641 Presence of right artificial hip joint: Secondary | ICD-10-CM | POA: Diagnosis not present

## 2018-12-12 DIAGNOSIS — Y9301 Activity, walking, marching and hiking: Secondary | ICD-10-CM | POA: Insufficient documentation

## 2018-12-12 DIAGNOSIS — S32591A Other specified fracture of right pubis, initial encounter for closed fracture: Secondary | ICD-10-CM | POA: Diagnosis not present

## 2018-12-12 DIAGNOSIS — I132 Hypertensive heart and chronic kidney disease with heart failure and with stage 5 chronic kidney disease, or end stage renal disease: Secondary | ICD-10-CM | POA: Diagnosis not present

## 2018-12-12 DIAGNOSIS — S32501A Unspecified fracture of right pubis, initial encounter for closed fracture: Secondary | ICD-10-CM

## 2018-12-12 DIAGNOSIS — S79911A Unspecified injury of right hip, initial encounter: Secondary | ICD-10-CM | POA: Diagnosis present

## 2018-12-12 MED ORDER — OXYCODONE-ACETAMINOPHEN 5-325 MG PO TABS
1.0000 | ORAL_TABLET | Freq: Once | ORAL | Status: AC
  Administered 2018-12-12: 1 via ORAL
  Filled 2018-12-12: qty 1

## 2018-12-12 MED ORDER — HYDROCODONE-ACETAMINOPHEN 5-325 MG PO TABS: 1.0000 | ORAL_TABLET | Freq: Four times a day (QID) | ORAL | 0 refills | Status: DC | PRN

## 2018-12-12 NOTE — Discharge Instructions (Addendum)
Your pelvic fracture is a stable injury, meaning you can ambulate with this injury with caution, it will not make the injury worse and should heal without any complication but you will have pain until it is completely healed.  Use your walker and be careful, avoid any activity that worsens your pain.  You may take the hydrocodone prescribed for pain relief.  This will make you drowsy - do not drive within 4 hours of taking this medication.

## 2018-12-12 NOTE — ED Notes (Signed)
Pt took 4 steps with 2 person assist. Pt normally walks with walker at facility.

## 2018-12-12 NOTE — ED Triage Notes (Signed)
Pt was walking to BR and walker slid. Pt landed on right side . Mobile xrays done at [facility]. Pt fell on Saturday. Unable to walk or bare weight

## 2018-12-12 NOTE — ED Provider Notes (Signed)
[facility] EMERGENCY DEPARTMENT Provider Note   CSN: [id number] Arrival date & time: 12/12/18  1737     History   Chief Complaint Chief Complaint  Patient presents with  . Hip Pain    HPI Erica [name] is a 63 y.o. female with a history of DM, CHF, GERD, seizure disorder, h/o MI, etoh induced chronic pancreatitis, HTN , presenting with right hip pain and inability to weight bear since she fell 2 days ago at her nursing home [facility]) while walking to the bathroom.  She uses a walker at baseline which slid causing her fall.  She is unsure if she hit her head but endorses moderate headache, but better today.  She denies upper extremity pain, also no chest/rib pain.  She does hurt in her lower back. She has had no treatment prior to arrival, but states portable xrays obtained were negative.  She has been nonweightbearing since the fall.     HPI  Past Medical History:  Diagnosis Date  . Alcohol-induced pancreatitis   . Chronic diarrhea   . Depression   . Diabetes mellitus    fasting blood sugar 110-120s  . Diastolic CHF ([place])   . DKA (diabetic ketoacidoses) ([place])   . Gastroparesis   . GERD (gastroesophageal reflux disease)   . Heart murmur   . History of kidney stones   . Hyperlipidemia   . Hypertension   . Hypokalemia   . Muscle spasm   . Neuropathic pain   . Neuropathy    Hx: of  . Pyelonephritis   . Seizures ([place])   . Vitamin B12 deficiency   . Vitamin D deficiency     Patient Active Problem List   Diagnosis Date Noted  . Seizure ([place]) 11/12/2018  . History of hydronephrosis --stents in place 12/08/2017  . Stroke ([place]) 11/29/2017  . Seizures ([place])   . Hydronephrosis   . Recurrent UTI   . Diabetes mellitus type 2 in nonobese (HCC)   . ESRD (end stage renal disease) ([place])   . Anemia of chronic disease   . Chronic diastolic congestive heart failure ([place])   . Essential hypertension   . PICC (peripherally inserted central catheter) in place   . Acute  pyelonephritis 11/12/2017  . Uncontrolled type 2 diabetes mellitus with hyperglycemia, with long-term current use of insulin ([place]) 11/10/2017  . Renal failure 11/08/2017  . Seizure disorder ([place]) 11/08/2017  . Orthostatic hypotension 07/25/2014  . Moderate protein malnutrition ([place]) 09/15/2013  . Aspiration pneumonia ([place]) 09/15/2013  . Acute respiratory failure requiring reintubation ([place]) 09/11/2013  . probable Seizures due to metabolic disorder [id number]  . Lactic acidosis 03/19/2013  . Abdominal pain 03/19/2013  . Rotavirus infection 10/29/2012  . Type II or unspecified type diabetes mellitus without mention of complication, uncontrolled 10/29/2012  . Protein-calorie malnutrition, severe ([place]) 10/27/2012  . NSTEMI (non-ST elevated myocardial infarction) ([place]) 10/26/2012  . Fever, unspecified 10/26/2012  . Hypotension 10/25/2012  . Metabolic acidosis [id number]  . Chronic diarrhea 10/25/2012  . Tobacco abuse 10/25/2012  . DKA (diabetic ketoacidoses) ([place]) 09/09/2012  . Dehydration 09/09/2012  . DKA, type 2 ([place]) 05/20/2012  . Abnormal LFTs 05/20/2012  . Heart murmur, systolic [id number]  . Hypoglycemia 07/08/2011  . Metabolic encephalopathy [id number]  . Alcohol abuse 07/08/2011  . Hypokalemia 07/08/2011  . Nausea & vomiting 07/08/2011  . H/O chronic pancreatitis 07/08/2011    Past Surgical History:  Procedure Laterality Date  . AV FISTULA PLACEMENT Left 12/15/2017   Procedure: INSERTION  OF ARTERIOVENOUS (AV) GORE-TEX GRAFT ARM;  Surgeon: Rosetta Posner, MD;  Location: [facility];  Service: Vascular;  Laterality: Left;  . CATARACT EXTRACTION W/PHACO Right 03/14/2015   Procedure: CATARACT EXTRACTION PHACO AND INTRAOCULAR LENS PLACEMENT (IOC);  Surgeon: Baruch Goldmann, MD;  Location: [facility];  Service: Ophthalmology;  Laterality: Right;  CDE:11.13  . CATARACT EXTRACTION W/PHACO Left 04/11/2015   Procedure: CATARACT EXTRACTION PHACO AND INTRAOCULAR LENS PLACEMENT LEFT EYE CDE=9.68;   Surgeon: Baruch Goldmann, MD;  Location: [facility];  Service: Ophthalmology;  Laterality: Left;  . COLONOSCOPY  02/24/2010  . cystoscopy with ureteral stent  Bilateral 10/07/2017   At [place] CV LINE RIGHT  11/17/2017  . IR FLUORO GUIDE CV LINE RIGHT  11/22/2017  . IR REMOVAL TUN CV CATH W/O FL  01/26/2018  . IR US GUIDE VASC ACCESS RIGHT  11/17/2017  . MULTIPLE EXTRACTIONS WITH ALVEOLOPLASTY N/A 06/13/2012   Procedure: MULTIPLE EXTRACION WITH ALVEOLOPLASTY EXTRACT: 18, 19, 20, 21, 22, 24, 25, 27, 28, 29, 30, 31;  Surgeon: Gae Bon, DDS;  Location: [place];  Service: Oral Surgery;  Laterality: N/A;  . TUBAL LIGATION    . URETERAL STENT PLACEMENT  09/2017     OB History    Gravida      Para      Term      Preterm      AB      Living  2     SAB      TAB      Ectopic      Multiple      Live Births               Home Medications    Prior to Admission medications   Medication Sig Start Date End Date Taking? Authorizing Provider  acetaminophen (TYLENOL) 325 MG tablet Take 1-2 tablets (325-650 mg total) by mouth every 4 (four) hours as needed for mild pain. Patient taking differently: Take 650 mg by mouth every 6 (six) hours. Midnight, 6am, noon and 6pm 12/16/17   Love, Ivan Anchors, PA-C  albuterol (PROAIR HFA) 108 (90 Base) MCG/ACT inhaler Inhale 2 puffs into the lungs every 4 (four) hours as needed for wheezing or shortness of breath.    [provider]  atorvastatin (LIPITOR) 40 MG tablet Take 40 mg by mouth every evening. For high cholesterol    [provider]  calcitRIOL (ROCALTROL) 0.25 MCG capsule Take 1 capsule (0.25 mcg total) by mouth Every Tuesday,Thursday,and Saturday with dialysis. 12/18/17   Love, Ivan Anchors, PA-C  carvedilol (COREG) 6.25 MG tablet Take 1 tablet (6.25 mg total) by mouth 2 (two) times daily. 11/15/18   Buriev, Arie Sabina, MD  dextrose (GLUTOSE) 40 % GEL Take 1 Tube by mouth every 10 (ten) minutes as needed  for low blood sugar (unitl blood sugar is above 70).    [provider]  ferric citrate (AURYXIA) 1 GM 210 MG(Fe) tablet Take 2 tablets (420 mg total) by mouth 3 (three) times daily with meals. 11/15/18   Kinnie Feil, MD  folic acid (FOLVITE) 1 MG tablet Take 1 mg by mouth every morning.    [provider]  HYDROcodone-acetaminophen (NORCO/VICODIN) 5-325 MG tablet Take 1 tablet by mouth every 6 (six) hours as needed for severe pain. 12/12/18   Ritchard Paragas, Almyra Free, PA-C  insulin lispro (HUMALOG) 100 UNIT/ML injection Inject 2-10 Units into the skin 4 (four) times daily. Per sliding  scale: CBG 151-200 2 units, 201-250 4 units, 251-300 6 units, 301-350 8 units, 351-400 10 units,    [provider]  levETIRAcetam (KEPPRA) 1000 MG tablet Take 1,000 mg by mouth every morning.    [provider]  levETIRAcetam (KEPPRA) 500 MG tablet Take 1 tablet (500 mg total) by mouth Every Tuesday,Thursday,and Saturday with dialysis. 500 mg, Oral, Every T-Th-Sa (Hemodialysis), First dose (after last modification) on Tue 11/15/18 at 1200, 11/15/18   Buriev, Arie Sabina, MD  Nutritional Supplements (FEEDING SUPPLEMENT, NEPRO CARB STEADY,) LIQD Take 237 mLs by mouth 3 (three) times daily as needed (Supplement). 11/15/18   Kinnie Feil, MD  phenytoin (DILANTIN) 125 MG/5ML suspension Take 6 mLs (150 mg total) by mouth 2 (two) times daily. 11/15/18   Kinnie Feil, MD  sodium bicarbonate 650 MG tablet Take 650 mg by mouth 4 (four) times daily. For indigestion - midnight, 6am, noon and 6pm    [provider]  topiramate (TOPAMAX) 25 MG tablet Take 1 tablet (25 mg total) by mouth 2 (two) times daily. Patient taking differently: Take 25 mg by mouth 2 (two) times daily. 10a and 10p 11/29/17 11/12/19  Arrien, Jimmy Picket, MD  Vitamin D, Ergocalciferol, (DRISDOL) 1.25 MG (50000 UT) CAPS capsule Take 50,000 Units by mouth every Friday.    [provider]    Family History  Family History  Problem Relation Age of Onset  . Diabetes Sister   . Chronic Renal Failure Neg Hx     Social History Social History   Tobacco Use  . Smoking status: Never Smoker  . Smokeless tobacco: Current User    Types: Snuff  Substance Use Topics  . Alcohol use: Yes    Alcohol/week: 1.0 standard drinks    Types: 1 Cans of beer per week    Comment: "sometimes i drink twice a week"  . Drug use: No     Allergies   Aspirin   Review of Systems Review of Systems  Constitutional: Negative for fever.  Musculoskeletal: Positive for arthralgias and back pain. Negative for joint swelling and myalgias.  Neurological: Positive for headaches. Negative for weakness and numbness.     Physical Exam Updated Vital Signs BP (!) 149/64 (BP Location: Right Arm)   Pulse 71   Temp 98.5 F (36.9 C) (Oral)   Resp 18   Ht 4\' 11"  (1.499 m)   Wt 51.7 kg   SpO2 96%   BMI 23.03 kg/m   Physical Exam Vitals signs and nursing note reviewed.  Constitutional:      Appearance: She is well-developed.  HENT:     Head: Normocephalic and atraumatic.  Eyes:     Conjunctiva/sclera: Conjunctivae normal.  Neck:     Musculoskeletal: Normal range of motion. No muscular tenderness.  Cardiovascular:     Rate and Rhythm: Normal rate and regular rhythm.     Heart sounds: Normal heart sounds.  Pulmonary:     Effort: Pulmonary effort is normal.     Breath sounds: Normal breath sounds. No wheezing.  Abdominal:     General: Bowel sounds are normal.     Palpations: Abdomen is soft.     Tenderness: There is no abdominal tenderness.  Musculoskeletal: Normal range of motion.     Right hip: She exhibits bony tenderness.     Comments: ttp right hip over right greater trochanter and medial right pubis and upper medial thigh.  Holding right leg in internal rotation.  Well healed right lateral  hip surgical incision.  (Pt reports right hip arthoplasty 2/20, [facility] Dr. Sandi Mealy) from prior fall/fracture.  Skin:     General: Skin is warm and dry.  Neurological:     Mental Status: She is alert.      ED Treatments / Results  Labs (all labs ordered are listed, but only abnormal results are displayed) Labs Reviewed - No data to display  EKG None  Radiology Dg Lumbar Spine Complete  Result Date: 12/12/2018 CLINICAL DATA:  Recent fall with low back pain, initial encounter EXAM: LUMBAR SPINE - COMPLETE 4+ VIEW COMPARISON:  12/08/2017 FINDINGS: Five lumbar type vertebral bodies are well visualized. Vertebral body height is well maintained with the exception of L2, T10 and T12 all of which demonstrate chronic compression deformity. Mild anterolisthesis of L4 on L5 is seen. Diffuse calcifications are noted consistent with chronic pancreatitis. No pars defect is seen. Mild retained fecal material is noted. IMPRESSION: Fresh in deformities at T10, T12 and L2 stable from the prior exam. No acute compression deformity is noted. Stable changes of chronic pancreatitis. Degenerative change with L4 on L5 anterolisthesis also stable. Electronically Signed   By: Inez Catalina M.D.   On: 12/12/2018 19:24   Ct Head Wo Contrast  Result Date: 12/12/2018 CLINICAL DATA:  63 year old who fell in her house 2 days ago when her walker slid away from her, causing her to land on her RIGHT side. She is unable to walk or bear weight. Initial encounter. Current history of diabetes, hypertension and end-stage renal disease on hemodialysis. EXAM: CT HEAD WITHOUT CONTRAST TECHNIQUE: Contiguous axial images were obtained from the base of the skull through the vertex without intravenous contrast. COMPARISON:  11/14/2017 and earlier. FINDINGS: Brain: Moderate cortical atrophy, unchanged. Mild changes of small vessel disease of the white matter diffusely, unchanged. Ventricular system normal in size and appearance for age. No mass lesion. No midline shift. No acute hemorrhage or hematoma. No extra-axial fluid collections. No evidence of acute  infarction. Vascular: Mild to moderate BILATERAL carotid siphon atherosclerosis. No hyperdense vessel. Skull: Hyperostosis frontalis interna. No skull fracture or other focal osseous abnormality involving the skull. Sinuses/Orbits: Visualized paranasal sinuses, bilateral mastoid air cells and bilateral middle ear cavities well-aerated. Frontal sinuses are hypoplastic. Visualized orbits and globes normal in appearance. Other: None. IMPRESSION: 1. No acute intracranial abnormality. 2. Stable moderate generalized atrophy and mild chronic microvascular ischemic changes of the white matter. Electronically Signed   By: Evangeline Dakin M.D.   On: 12/12/2018 19:18   Ct Pelvis Wo Contrast  Result Date: 12/12/2018 CLINICAL DATA:  63 year old with prior RIGHT hip arthroplasty who fell and complains of RIGHT hip pain. Initial encounter. EXAM: CT PELVIS WITHOUT CONTRAST TECHNIQUE: Multidetector CT imaging of the pelvis was performed following the standard protocol without intravenous contrast. COMPARISON:  RIGHT hip x-rays earlier same day. CT abdomen and pelvis 04/08/2018 and earlier. FINDINGS: Urinary Tract: Urinary bladder predominantly obscured by the metallic beam hardening streak artifact from the RIGHT hip prosthesis. Bowel: Large stool burden throughout the visualized colon, including the rectum. Visualized small bowel unremarkable. Vascular/Lymphatic: Moderate to severe aortoiliofemoral atherosclerosis. Calcification involving numerous small arteries as is seen in patients with end-stage renal disease on hemodialysis. Reproductive: Calcified, degenerated uterine fibroid. No adnexal masses. Other:  Mild diffuse body wall edema. Musculoskeletal: Anatomic alignment of the unipolar RIGHT hip arthroplasty. Osseous demineralization. Acute nondisplaced fracture involving the RIGHT INFERIOR pubic ramus. No acute fractures elsewhere. Sacroiliac joints and symphysis pubis anatomically aligned without diastasis. Mild  narrowing of the joint space in the LEFT hip. IMPRESSION: 1. Acute nondisplaced fracture involving the RIGHT INFERIOR pubic ramus. 2. Anatomic alignment of the unipolar RIGHT hip arthroplasty. 3. Large stool burden throughout the visualized colon, including the rectum. Aortic Atherosclerosis (ICD10-I70.0). Electronically Signed   By: Evangeline Dakin M.D.   On: 12/12/2018 20:47   Dg Hip Unilat W Or Wo Pelvis 2-3 Views Right  Result Date: 12/12/2018 CLINICAL DATA:  Recent fall with right hip pain, initial encounter EXAM: DG HIP (WITH OR WITHOUT PELVIS) 3V RIGHT COMPARISON:  04/07/2018 FINDINGS: Right hip prosthesis is noted. This is new from the prior exam. No definitive fracture or dislocation is noted. No soft tissue abnormality is seen. IMPRESSION: Status post right hip replacement. No acute bony abnormality is seen. Electronically Signed   By: Inez Catalina M.D.   On: 12/12/2018 19:22    Procedures Procedures (including critical care time)  Medications Ordered in ED Medications  oxyCODONE-acetaminophen (PERCOCET/ROXICET) 5-325 MG per tablet 1 tablet (1 tablet Oral Given 12/12/18 2040)     Initial Impression / Assessment and Plan / ED Course  I have reviewed the triage vital signs and the nursing notes.  Pertinent labs & imaging results that were available during my care of the patient were reviewed by me and considered in my medical decision making (see chart for details).        Images reviewed and no acute fractures.  Discussed with Dr. Golden Circle, radiology utility of CT imaging given recent prosthesis. Recommended with scanning through the prosthesis.  Ct imaging reviewed and discussed with pt. Stable fracture, advised activity as tolerated.  Prescribed hydrocodone for pain relief.  Pt has walker at her nursing facility.  Referral to ortho here locally for further management of this injury. Prior surgery of right hip done at [facility] as was seen at [facility], had to transfer [facility]  for her surgery.    Final Clinical Impressions(s) / ED Diagnoses   Final diagnoses:  Closed fracture of right pubis, unspecified portion of pubis, initial encounter [facility]    ED Discharge Orders         Ordered    HYDROcodone-acetaminophen (NORCO/VICODIN) 5-325 MG tablet  Every 6 hours PRN     12/12/18 2105           Evalee Jefferson, PA-C 12/12/18 2233    Long, Wonda Olds, MD 12/13/18 1058

## 2019-02-02 ENCOUNTER — Emergency Department (HOSPITAL_COMMUNITY): Payer: Medicaid Other

## 2019-02-02 ENCOUNTER — Encounter (HOSPITAL_COMMUNITY): Payer: Self-pay

## 2019-02-02 ENCOUNTER — Emergency Department (HOSPITAL_COMMUNITY)
Admission: EM | Admit: 2019-02-02 | Discharge: 2019-02-02 | Disposition: A | Discharge status: Home / Self Care | Payer: Medicaid Other | Attending: Emergency Medicine | Admitting: Emergency Medicine

## 2019-02-02 ENCOUNTER — Other Ambulatory Visit: Payer: Self-pay

## 2019-02-02 DIAGNOSIS — Z20828 Contact with and (suspected) exposure to other viral communicable diseases: Secondary | ICD-10-CM | POA: Diagnosis not present

## 2019-02-02 DIAGNOSIS — R110 Nausea: Secondary | ICD-10-CM | POA: Diagnosis present

## 2019-02-02 DIAGNOSIS — I132 Hypertensive heart and chronic kidney disease with heart failure and with stage 5 chronic kidney disease, or end stage renal disease: Secondary | ICD-10-CM | POA: Diagnosis not present

## 2019-02-02 DIAGNOSIS — I5030 Unspecified diastolic (congestive) heart failure: Secondary | ICD-10-CM | POA: Insufficient documentation

## 2019-02-02 DIAGNOSIS — R7989 Other specified abnormal findings of blood chemistry: Secondary | ICD-10-CM | POA: Diagnosis not present

## 2019-02-02 DIAGNOSIS — Z794 Long term (current) use of insulin: Secondary | ICD-10-CM | POA: Insufficient documentation

## 2019-02-02 DIAGNOSIS — E1122 Type 2 diabetes mellitus with diabetic chronic kidney disease: Secondary | ICD-10-CM | POA: Insufficient documentation

## 2019-02-02 DIAGNOSIS — Z992 Dependence on renal dialysis: Secondary | ICD-10-CM | POA: Diagnosis not present

## 2019-02-02 DIAGNOSIS — R197 Diarrhea, unspecified: Secondary | ICD-10-CM | POA: Diagnosis not present

## 2019-02-02 DIAGNOSIS — R112 Nausea with vomiting, unspecified: Secondary | ICD-10-CM | POA: Insufficient documentation

## 2019-02-02 DIAGNOSIS — N186 End stage renal disease: Secondary | ICD-10-CM | POA: Diagnosis not present

## 2019-02-02 DIAGNOSIS — R1011 Right upper quadrant pain: Secondary | ICD-10-CM | POA: Insufficient documentation

## 2019-02-02 DIAGNOSIS — Z79899 Other long term (current) drug therapy: Secondary | ICD-10-CM | POA: Insufficient documentation

## 2019-02-02 DIAGNOSIS — F17220 Nicotine dependence, chewing tobacco, uncomplicated: Secondary | ICD-10-CM | POA: Diagnosis not present

## 2019-02-02 LAB — COMPREHENSIVE METABOLIC PANEL
ALT: 357 U/L — ABNORMAL HIGH (ref 0–44)
AST: 352 U/L — ABNORMAL HIGH (ref 15–41)
Albumin: 3.9 g/dL (ref 3.5–5.0)
Alkaline Phosphatase: 343 U/L — ABNORMAL HIGH (ref 38–126)
Anion gap: 16 — ABNORMAL HIGH (ref 5–15)
BUN: 79 mg/dL — ABNORMAL HIGH (ref 8–23)
CO2: 27 mmol/L (ref 22–32)
Calcium: 8.4 mg/dL — ABNORMAL LOW (ref 8.9–10.3)
Chloride: 88 mmol/L — ABNORMAL LOW (ref 98–111)
Creatinine, Ser: 4.76 mg/dL — ABNORMAL HIGH (ref 0.44–1.00)
GFR calc Af Amer: 10 mL/min — ABNORMAL LOW (ref >60)
GFR calc non Af Amer: 9 mL/min — ABNORMAL LOW (ref >60)
Glucose, Bld: 76 mg/dL (ref 70–99)
Potassium: 5.3 mmol/L — ABNORMAL HIGH (ref 3.5–5.1)
Sodium: 131 mmol/L — ABNORMAL LOW (ref 135–145)
Total Bilirubin: 0.7 mg/dL (ref 0.3–1.2)
Total Protein: 7.3 g/dL (ref 6.5–8.1)

## 2019-02-02 LAB — CBC WITH DIFFERENTIAL/PLATELET
Abs Immature Granulocytes: 0.01 10*3/uL (ref 0.00–0.07)
Basophils Absolute: 0 10*3/uL (ref 0.0–0.1)
Basophils Relative: 1 %
Eosinophils Absolute: 0.2 10*3/uL (ref 0.0–0.5)
Eosinophils Relative: 4 %
HCT: 40.6 % (ref 36.0–46.0)
Hemoglobin: 13 g/dL (ref 12.0–15.0)
Immature Granulocytes: 0 %
Lymphocytes Relative: 27 %
Lymphs Abs: 1.8 10*3/uL (ref 0.7–4.0)
MCH: 34.7 pg — ABNORMAL HIGH (ref 26.0–34.0)
MCHC: 32 g/dL (ref 30.0–36.0)
MCV: 108.3 fL — ABNORMAL HIGH (ref 80.0–100.0)
Monocytes Absolute: 0.7 10*3/uL (ref 0.1–1.0)
Monocytes Relative: 11 %
Neutro Abs: 3.7 10*3/uL (ref 1.7–7.7)
Neutrophils Relative %: 57 %
Platelets: 157 10*3/uL (ref 150–400)
RBC: 3.75 MIL/uL — ABNORMAL LOW (ref 3.87–5.11)
RDW: 12.2 % (ref 11.5–15.5)
WBC: 6.5 10*3/uL (ref 4.0–10.5)
nRBC: 0 % (ref 0.0–0.2)

## 2019-02-02 LAB — LIPASE, BLOOD: Lipase: 14 U/L (ref 11–51)

## 2019-02-02 LAB — POC SARS CORONAVIRUS 2 AG -  ED: SARS Coronavirus 2 Ag: NEGATIVE

## 2019-02-02 MED ORDER — ONDANSETRON 4 MG PO TBDP: 4.0000 mg | ORAL_TABLET | Freq: Three times a day (TID) | ORAL | 0 refills | Status: DC | PRN

## 2019-02-02 MED ORDER — ONDANSETRON 4 MG PO TBDP
4.0000 mg | ORAL_TABLET | Freq: Once | ORAL | Status: AC
  Administered 2019-02-02: 4 mg via ORAL
  Filled 2019-02-02: qty 1

## 2019-02-02 MED ORDER — ACETAMINOPHEN 325 MG PO TABS
650.0000 mg | ORAL_TABLET | Freq: Once | ORAL | Status: AC
  Administered 2019-02-02: 650 mg via ORAL
  Filled 2019-02-02: qty 2

## 2019-02-02 NOTE — ED Provider Notes (Signed)
[facility] EMERGENCY DEPARTMENT Provider Note   CSN: [id number] Arrival date & time: 02/02/19  1149     History Chief Complaint  Patient presents with  . Nausea  . Headache    Erica [name] is a 63 y.o. female with hx of ESRD on dialysis Tu/Th/Sat, DM, chronic diarrhea, chronic pancreatitis who presents with a headache and abdominal pain. She states that her symptoms started last night. She is from a SNF, [place]. She reports frontal headache, dry cough, epigastric abdominal pain, N/V/D. She tried to eat breakfast this morning but couldn't keep it down. She started to have epigastric abdominal pain after vomiting. She went to dialysis but could not start her treatment due to symptoms. She denies fever, chills, body aches, chest pain. She has chronic SOB which is unchanged. She has a hx of chronic intermittent diarrhea and reports 4 episodes of watery stool this morning.   HPI     Past Medical History:  Diagnosis Date  . Alcohol-induced pancreatitis   . Chronic diarrhea   . Depression   . Diabetes mellitus    fasting blood sugar 110-120s  . Diastolic CHF ([place])   . DKA (diabetic ketoacidoses) ([place])   . Gastroparesis   . GERD (gastroesophageal reflux disease)   . Heart murmur   . History of kidney stones   . Hyperlipidemia   . Hypertension   . Hypokalemia   . Muscle spasm   . Neuropathic pain   . Neuropathy    Hx: of  . Pyelonephritis   . Seizures ([place])   . Vitamin B12 deficiency   . Vitamin D deficiency     Patient Active Problem List   Diagnosis Date Noted  . Seizure ([place]) 11/12/2018  . History of hydronephrosis --stents in place 12/08/2017  . Stroke ([place]) 11/29/2017  . Seizures ([place])   . Hydronephrosis   . Recurrent UTI   . Diabetes mellitus type 2 in nonobese (HCC)   . ESRD (end stage renal disease) ([place])   . Anemia of chronic disease   . Chronic diastolic congestive heart failure ([place])   . Essential hypertension   . PICC (peripherally  inserted central catheter) in place   . Acute pyelonephritis 11/12/2017  . Uncontrolled type 2 diabetes mellitus with hyperglycemia, with long-term current use of insulin ([place]) 11/10/2017  . Renal failure 11/08/2017  . Seizure disorder ([place]) 11/08/2017  . Orthostatic hypotension 07/25/2014  . Moderate protein malnutrition ([place]) 09/15/2013  . Aspiration pneumonia ([place]) 09/15/2013  . Acute respiratory failure requiring reintubation ([place]) 09/11/2013  . probable Seizures due to metabolic disorder [id number]  . Lactic acidosis 03/19/2013  . Abdominal pain 03/19/2013  . Rotavirus infection 10/29/2012  . Type II or unspecified type diabetes mellitus without mention of complication, uncontrolled 10/29/2012  . Protein-calorie malnutrition, severe ([place]) 10/27/2012  . NSTEMI (non-ST elevated myocardial infarction) ([place]) 10/26/2012  . Fever, unspecified 10/26/2012  . Hypotension 10/25/2012  . Metabolic acidosis [id number]  . Chronic diarrhea 10/25/2012  . Tobacco abuse 10/25/2012  . DKA (diabetic ketoacidoses) ([place]) 09/09/2012  . Dehydration 09/09/2012  . DKA, type 2 ([place]) 05/20/2012  . Abnormal LFTs 05/20/2012  . Heart murmur, systolic [id number]  . Hypoglycemia 07/08/2011  . Metabolic encephalopathy [id number]  . Alcohol abuse 07/08/2011  . Hypokalemia 07/08/2011  . Nausea & vomiting 07/08/2011  . H/O chronic pancreatitis 07/08/2011    Past Surgical History:  Procedure Laterality Date  . AV FISTULA PLACEMENT Left 12/15/2017   Procedure: INSERTION OF ARTERIOVENOUS (AV) GORE-TEX  GRAFT ARM;  Surgeon: Rosetta Posner, MD;  Location: [facility];  Service: Vascular;  Laterality: Left;  . CATARACT EXTRACTION W/PHACO Right 03/14/2015   Procedure: CATARACT EXTRACTION PHACO AND INTRAOCULAR LENS PLACEMENT (IOC);  Surgeon: Baruch Goldmann, MD;  Location: [facility];  Service: Ophthalmology;  Laterality: Right;  CDE:11.13  . CATARACT EXTRACTION W/PHACO Left 04/11/2015   Procedure: CATARACT EXTRACTION PHACO AND  INTRAOCULAR LENS PLACEMENT LEFT EYE CDE=9.68;  Surgeon: Baruch Goldmann, MD;  Location: [facility];  Service: Ophthalmology;  Laterality: Left;  . COLONOSCOPY  02/24/2010  . cystoscopy with ureteral stent  Bilateral 10/07/2017   At [facility] CV LINE RIGHT  11/17/2017  . IR FLUORO GUIDE CV LINE RIGHT  11/22/2017  . IR REMOVAL TUN CV CATH W/O FL  01/26/2018  . IR US GUIDE VASC ACCESS RIGHT  11/17/2017  . MULTIPLE EXTRACTIONS WITH ALVEOLOPLASTY N/A 06/13/2012   Procedure: MULTIPLE EXTRACION WITH ALVEOLOPLASTY EXTRACT: 18, 19, 20, 21, 22, 24, 25, 27, 28, 29, 30, 31;  Surgeon: Gae Bon, DDS;  Location: [facility];  Service: Oral Surgery;  Laterality: N/A;  . TUBAL LIGATION    . URETERAL STENT PLACEMENT  09/2017     OB History    Gravida      Para      Term      Preterm      AB      Living  2     SAB      TAB      Ectopic      Multiple      Live Births              Family History  Problem Relation Age of Onset  . Diabetes Sister   . Chronic Renal Failure Neg Hx     Social History   Tobacco Use  . Smoking status: Never Smoker  . Smokeless tobacco: Current User    Types: Snuff  Substance Use Topics  . Alcohol use: Not Currently    Alcohol/week: 1.0 standard drinks    Types: 1 Cans of beer per week  . Drug use: No    Home Medications Prior to Admission medications   Medication Sig Start Date End Date Taking? Authorizing Provider  acetaminophen (TYLENOL) 325 MG tablet Take 1-2 tablets (325-650 mg total) by mouth every 4 (four) hours as needed for mild pain. Patient taking differently: Take 650 mg by mouth every 6 (six) hours. Midnight, 6am, noon and 6pm 12/16/17   Love, Ivan Anchors, PA-C  albuterol (PROAIR HFA) 108 (90 Base) MCG/ACT inhaler Inhale 2 puffs into the lungs every 4 (four) hours as needed for wheezing or shortness of breath.    [provider]  atorvastatin (LIPITOR) 40 MG tablet Take 40 mg by mouth every evening. For high  cholesterol    [provider]  calcitRIOL (ROCALTROL) 0.25 MCG capsule Take 1 capsule (0.25 mcg total) by mouth Every Tuesday,Thursday,and Saturday with dialysis. 12/18/17   Love, Ivan Anchors, PA-C  carvedilol (COREG) 6.25 MG tablet Take 1 tablet (6.25 mg total) by mouth 2 (two) times daily. 11/15/18   Buriev, Arie Sabina, MD  dextrose (GLUTOSE) 40 % GEL Take 1 Tube by mouth every 10 (ten) minutes as needed for low blood sugar (unitl blood sugar is above 70).    [provider]  ferric citrate (AURYXIA) 1 GM 210 MG(Fe) tablet Take 2 tablets (420 mg total) by mouth 3 (three) times daily  with meals. 11/15/18   Kinnie Feil, MD  folic acid (FOLVITE) 1 MG tablet Take 1 mg by mouth every morning.    [provider]  HYDROcodone-acetaminophen (NORCO/VICODIN) 5-325 MG tablet Take 1 tablet by mouth every 6 (six) hours as needed for severe pain. 12/12/18   Idol, Almyra Free, PA-C  insulin lispro (HUMALOG) 100 UNIT/ML injection Inject 2-10 Units into the skin 4 (four) times daily. Per sliding scale: CBG 151-200 2 units, 201-250 4 units, 251-300 6 units, 301-350 8 units, 351-400 10 units,    [provider]  levETIRAcetam (KEPPRA) 1000 MG tablet Take 1,000 mg by mouth every morning.    [provider]  levETIRAcetam (KEPPRA) 500 MG tablet Take 1 tablet (500 mg total) by mouth Every Tuesday,Thursday,and Saturday with dialysis. 500 mg, Oral, Every T-Th-Sa (Hemodialysis), First dose (after last modification) on Tue 11/15/18 at 1200, 11/15/18   Buriev, Arie Sabina, MD  Nutritional Supplements (FEEDING SUPPLEMENT, NEPRO CARB STEADY,) LIQD Take 237 mLs by mouth 3 (three) times daily as needed (Supplement). 11/15/18   Kinnie Feil, MD  phenytoin (DILANTIN) 125 MG/5ML suspension Take 6 mLs (150 mg total) by mouth 2 (two) times daily. 11/15/18   Kinnie Feil, MD  sodium bicarbonate 650 MG tablet Take 650 mg by mouth 4 (four) times daily. For indigestion - midnight, 6am, noon and 6pm     [provider]  topiramate (TOPAMAX) 25 MG tablet Take 1 tablet (25 mg total) by mouth 2 (two) times daily. Patient taking differently: Take 25 mg by mouth 2 (two) times daily. 10a and 10p 11/29/17 11/12/19  Arrien, Jimmy Picket, MD  Vitamin D, Ergocalciferol, (DRISDOL) 1.25 MG (50000 UT) CAPS capsule Take 50,000 Units by mouth every Friday.    [provider]    Allergies    Aspirin  Review of Systems   Review of Systems  Constitutional: Negative for chills, diaphoresis and fever.  Respiratory: Positive for cough and shortness of breath (chronic, unchanged).   Cardiovascular: Negative for chest pain.  Gastrointestinal: Positive for abdominal pain (epigastric), diarrhea, nausea and vomiting.  Genitourinary: Negative for dysuria.  Allergic/Immunologic: Positive for immunocompromised state.  Neurological: Positive for headaches.  All other systems reviewed and are negative.   Physical Exam Updated Vital Signs BP (!) 168/89 (BP Location: Right Arm)   Pulse 70   Temp 98 F (36.7 C) (Oral)   Resp (!) 21   Ht 4\' 11"  (1.499 m)   Wt 40.4 kg   SpO2 97%   BMI 17.98 kg/m   Physical Exam Vitals and nursing note reviewed.  Constitutional:      General: She is not in acute distress.    Appearance: She is well-developed. She is not ill-appearing.  HENT:     Head: Normocephalic and atraumatic.  Eyes:     General: No scleral icterus.       Right eye: No discharge.        Left eye: No discharge.     Conjunctiva/sclera: Conjunctivae normal.     Pupils: Pupils are equal, round, and reactive to light.     Comments: Pinpoint pupils  Cardiovascular:     Rate and Rhythm: Normal rate and regular rhythm.     Comments: LUE: Fistula with palpable thrill Pulmonary:     Effort: Pulmonary effort is normal. No respiratory distress.     Breath sounds: Normal breath sounds.  Abdominal:     General: There is no distension.     Palpations: Abdomen  is soft.    Musculoskeletal:        General: No tenderness.     Cervical back: Normal range of motion.  Skin:    General: Skin is warm and dry.  Neurological:     Mental Status: She is alert and oriented to person, place, and time.  Psychiatric:        Behavior: Behavior normal.     ED Results / Procedures / Treatments   Labs (all labs ordered are listed, but only abnormal results are displayed) Labs Reviewed  COMPREHENSIVE METABOLIC PANEL - Abnormal; Notable for the following components:      Result Value   Sodium 131 (*)    Potassium 5.3 (*)    Chloride 88 (*)    BUN 79 (*)    Creatinine, Ser 4.76 (*)    Calcium 8.4 (*)    AST 352 (*)    ALT 357 (*)    Alkaline Phosphatase 343 (*)    GFR calc non Af Amer 9 (*)    GFR calc Af Amer 10 (*)    Anion gap 16 (*)    All other components within normal limits  CBC WITH DIFFERENTIAL/PLATELET - Abnormal; Notable for the following components:   RBC 3.75 (*)    MCV 108.3 (*)    MCH 34.7 (*)    All other components within normal limits  SARS CORONAVIRUS 2 (TAT 6-24 HRS)  LIPASE, BLOOD  URINALYSIS, ROUTINE W REFLEX MICROSCOPIC  HEPATITIS PANEL, ACUTE  POC SARS CORONAVIRUS 2 AG -  ED    EKG EKG Interpretation  Date/Time:  Thursday February 02 2019 12:12:17 EST Ventricular Rate:  70 PR Interval:    QRS Duration: 146 QT Interval:  444 QTC Calculation: 480 R Axis:   7 Text Interpretation: Sinus  rhythm Borderline short PR interval Nonspecific intraventricular conduction delay Anteroseptal infarct, old Minimal ST depression, inferior leads No STEMI Confirmed by Nanda Quinton [phone]) on 02/02/2019 12:40:17 PM   Radiology DG Chest Port 1 View  Result Date: 02/02/2019 CLINICAL DATA:  Cough EXAM: PORTABLE CHEST 1 VIEW COMPARISON:  10/27/2018 FINDINGS: Mild cardiomegaly, stable. No focal airspace consolidation, pleural effusion, or pneumothorax. IMPRESSION: No acute cardiopulmonary findings. Electronically Signed   By: Davina Poke M.D.    On: 02/02/2019 13:26   US Abdomen Limited RUQ  Result Date: 02/02/2019 CLINICAL DATA:  Elevated LFTs, RIGHT-side pain for 3 weeks, diabetes mellitus, hypertension, CHF, GERD, history alcoholic pancreatitis EXAM: ULTRASOUND ABDOMEN LIMITED RIGHT UPPER QUADRANT COMPARISON:  CT abdomen and pelvis 04/08/2018 FINDINGS: Gallbladder: Not visualized, question surgically absent versus contracted; patient denies prior cholecystectomy; gallbladder not identified on prior CT exam either Common bile duct: Diameter: 4 mm, normal Liver: Echogenic parenchyma, likely fatty infiltration though this can be seen with cirrhosis and certain infiltrative disorders. No focal hepatic mass knee gin or nodularity. Minimal intrahepatic biliary dilatation centrally. Portal vein is patent on color Doppler imaging with normal direction of blood flow towards the liver. Other: No RIGHT upper quadrant free fluid. IMPRESSION: Question fatty infiltration of liver as above with minimal central intrahepatic biliary dilatation, though CBD is normal in caliber 4 mm diameter. Gallbladder not visualized, question surgically absent versus contracted; recommend correlation with patient surgical history Electronically Signed   By: Lavonia Dana M.D.   On: 02/02/2019 14:59    Procedures Procedures (including critical care time)  Medications Ordered in ED Medications  acetaminophen (TYLENOL) tablet 650 mg (650 mg Oral Given 02/02/19 1320)  ondansetron (ZOFRAN-ODT) disintegrating  tablet 4 mg (4 mg Oral Given 02/02/19 1320)    ED Course  I have reviewed the triage vital signs and the nursing notes.  Pertinent labs & imaging results that were available during my care of the patient were reviewed by me and considered in my medical decision making (see chart for details).  63 year old female with headache and nausea, vomiting, diarrhea since today.  She is hypertensive but otherwise vital signs are normal.  Heart is regular rate and rhythm.   Lungs are clear to auscultation.  She has no abdominal tenderness.  On review of EMR she has had similar ED presentations.  Will check labs, Covid test  CBC is remarkable for high MCV which she has had in the past.  There is no leukocytosis.  CMP is remarkable for multiple derangements.  She has mild hyperkalemia.  Mildly elevated anion gap which is chronic.  She does have a new significant elevation of her AST and ALT.  Her lipase is normal.  Will obtain repeat right upper quadrant ultrasound.  Chest x-ray is negative.  EKG is sinus rhythm.  Rapid Covid test negative  Right upper quadrant ultrasound shows possible fatty liver disease and intrahepatic bile duct dilatation which has been seen on prior imaging. Gallbladder is absent. I do not see cholecystectomy in her surgical hx although on prior scans her gallbladder was absent as well so likely she has had one. Will send of hepatitis panel. She has had no further vomiting or diarrhea in the ED. Will obtain outpatient COVID testing and d/c back to her facility. Her primary RN spoke with an RN at her facility and they will try to get her in to dialysis tomorrow. Will also send rx for zofran.  MDM Rules/Calculators/A&P  Final Clinical Impression(s) / ED Diagnoses Final diagnoses:  Elevated LFTs  Nausea vomiting and diarrhea    Rx / DC Orders ED Discharge Orders         Ordered    ondansetron (ZOFRAN ODT) 4 MG disintegrating tablet  Every 8 hours PRN     02/02/19 1952           Recardo Evangelist, PA-C 02/02/19 2114    Margette Fast, MD 02/06/19 [phone]

## 2019-02-02 NOTE — Discharge Instructions (Signed)
Avoid Tylenol because your blood work today shows your liver is inflamed Please follow up with dialysis tomorrow Take Zofran for nausea

## 2019-02-02 NOTE — ED Triage Notes (Addendum)
Pt resides at [facility] and was brought in by EMS. Pt was at dialysis. Complained of N,V, D since last night as well as HA. Last time she vomited and had diarrhea was this morning . Pt did not receive dialysis

## 2019-02-02 NOTE — ED Notes (Signed)
Attempted to call report

## 2019-02-02 NOTE — ED Notes (Signed)
Updated daughter. 

## 2019-02-02 NOTE — ED Notes (Signed)
Drinking but has some nausea. Not vomiting

## 2019-02-03 LAB — HEPATITIS PANEL, ACUTE
HCV Ab: NONREACTIVE
Hep A IgM: NONREACTIVE
Hep B C IgM: NONREACTIVE
Hepatitis B Surface Ag: NONREACTIVE

## 2019-02-03 LAB — SARS CORONAVIRUS 2 (TAT 6-24 HRS): SARS Coronavirus 2: NEGATIVE

## 2019-02-04 ENCOUNTER — Emergency Department (HOSPITAL_COMMUNITY)
Admission: EM | Admit: 2019-02-04 | Discharge: 2019-02-04 | Disposition: A | Discharge status: Home / Self Care | Payer: Medicaid Other | Attending: Emergency Medicine | Admitting: Emergency Medicine

## 2019-02-04 ENCOUNTER — Encounter (HOSPITAL_COMMUNITY): Payer: Self-pay | Admitting: Emergency Medicine

## 2019-02-04 ENCOUNTER — Other Ambulatory Visit: Payer: Self-pay

## 2019-02-04 DIAGNOSIS — N186 End stage renal disease: Secondary | ICD-10-CM

## 2019-02-04 DIAGNOSIS — G40909 Epilepsy, unspecified, not intractable, without status epilepticus: Secondary | ICD-10-CM | POA: Diagnosis not present

## 2019-02-04 DIAGNOSIS — Z794 Long term (current) use of insulin: Secondary | ICD-10-CM | POA: Diagnosis not present

## 2019-02-04 DIAGNOSIS — E162 Hypoglycemia, unspecified: Secondary | ICD-10-CM

## 2019-02-04 DIAGNOSIS — Z992 Dependence on renal dialysis: Secondary | ICD-10-CM | POA: Diagnosis not present

## 2019-02-04 DIAGNOSIS — E1140 Type 2 diabetes mellitus with diabetic neuropathy, unspecified: Secondary | ICD-10-CM | POA: Insufficient documentation

## 2019-02-04 DIAGNOSIS — I5032 Chronic diastolic (congestive) heart failure: Secondary | ICD-10-CM | POA: Diagnosis not present

## 2019-02-04 DIAGNOSIS — I132 Hypertensive heart and chronic kidney disease with heart failure and with stage 5 chronic kidney disease, or end stage renal disease: Secondary | ICD-10-CM | POA: Insufficient documentation

## 2019-02-04 DIAGNOSIS — E11649 Type 2 diabetes mellitus with hypoglycemia without coma: Secondary | ICD-10-CM | POA: Diagnosis present

## 2019-02-04 DIAGNOSIS — E1122 Type 2 diabetes mellitus with diabetic chronic kidney disease: Secondary | ICD-10-CM | POA: Diagnosis not present

## 2019-02-04 DIAGNOSIS — T68XXXA Hypothermia, initial encounter: Secondary | ICD-10-CM

## 2019-02-04 DIAGNOSIS — I252 Old myocardial infarction: Secondary | ICD-10-CM | POA: Diagnosis not present

## 2019-02-04 LAB — CBG MONITORING, ED
Glucose-Capillary: 318 mg/dL — ABNORMAL HIGH (ref 70–99)
Glucose-Capillary: 212 mg/dL — ABNORMAL HIGH (ref 70–99)

## 2019-02-04 MED ORDER — DEXTROSE 50 % IV SOLN
INTRAVENOUS | Status: AC
  Filled 2019-02-04: qty 50

## 2019-02-04 MED ORDER — GLUCAGON HCL RDNA (DIAGNOSTIC) 1 MG IJ SOLR
INTRAMUSCULAR | Status: AC
  Filled 2019-02-04: qty 1

## 2019-02-04 NOTE — ED Notes (Signed)
Pt drinking 4 oz of apple juice at this time per Dr. Lacinda Axon.

## 2019-02-04 NOTE — ED Notes (Signed)
Pt starting to respond by crying out.

## 2019-02-04 NOTE — ED Notes (Signed)
EMS called for transport.

## 2019-02-04 NOTE — Discharge Instructions (Addendum)
Erica [name] was very cold when she came to the emergency department and her blood sugar was extremely low.  This has all improved with time.  Make sure she stays very warm tonight.  Give her a snack before bedtime.  Check her sugars carefully.

## 2019-02-04 NOTE — ED Notes (Signed)
Pt was brought in by [employer], unresponsive, fell out of her wheelchair in the truck. Pt had just had dialysis completed and was on her way back to [facility] in [place]. [place] driver told us pt is normally alert and oriented. He said pt just went unresponsive and fell out of her wheelchair and he just drove to the hospital EMS bay and called for help. Pt is cold, diaphoretic, unresponsive to any stimulus.

## 2019-02-04 NOTE — ED Notes (Signed)
Pt's family made aware she is here. Not concerned and not coming at this time.

## 2019-02-04 NOTE — ED Notes (Signed)
Pt now talking and stating "I went to dialysis. I was trying to tell someone my sugar was low, but no one could understand me."

## 2019-02-04 NOTE — ED Notes (Addendum)
CBG 21. Unable to IV access at this time after 3 attempts. Glucagon IM given.

## 2019-02-04 NOTE — ED Notes (Signed)
CBG 318 at this time. Dr. Lacinda Axon at bedside and aware.

## 2019-02-04 NOTE — ED Notes (Signed)
Pt's daughter updated at this time. 

## 2019-02-04 NOTE — ED Provider Notes (Addendum)
[facility] Provider Note   CSN: [id number] Arrival date & time: 02/04/19  1737     History Chief Complaint  Patient presents with   Hypoglycemia    Erica [name] is a 63 y.o. female.  Level 5 caveat for urgency of condition.  Patient has end-stage renal disease with dialysis Tuesday Thursday Saturday.  She is returning from dialysis today when she became unresponsive.  EMS noted that she slumped out of her wheelchair.  Cool, diaphoretic, unresponsive initially.  Glucose in ED 21.  Temp subnormal.  No prodromal illnesses.        Past Medical History:  Diagnosis Date   Alcohol-induced pancreatitis    Chronic diarrhea    Depression    Diabetes mellitus    fasting blood sugar XX123456   Diastolic CHF (HCC)    DKA (diabetic ketoacidoses) (HCC)    Gastroparesis    GERD (gastroesophageal reflux disease)    Heart murmur    History of kidney stones    Hyperlipidemia    Hypertension    Hypokalemia    Muscle spasm    Neuropathic pain    Neuropathy    Hx: of   Pyelonephritis    Seizures ([place])    Vitamin B12 deficiency    Vitamin D deficiency     Patient Active Problem List   Diagnosis Date Noted   Seizure ([place]) 11/12/2018   History of hydronephrosis --stents in place 12/08/2017   Stroke ([place]) 11/29/2017   Seizures ([place])    Hydronephrosis    Recurrent UTI    Diabetes mellitus type 2 in nonobese ([place])    ESRD (end stage renal disease) ([place])    Anemia of chronic disease    Chronic diastolic congestive heart failure ([place])    Essential hypertension    PICC (peripherally inserted central catheter) in place    Acute pyelonephritis 11/12/2017   Uncontrolled type 2 diabetes mellitus with hyperglycemia, with long-term current use of insulin ([place]) 11/10/2017   Renal failure 11/08/2017   Seizure disorder ([place]) 11/08/2017   Orthostatic hypotension 07/25/2014   Moderate protein malnutrition (HCC) 09/15/2013     Aspiration pneumonia ([place]) 09/15/2013   Acute respiratory failure requiring reintubation ([place]) 09/11/2013   probable Seizures due to metabolic disorder XX123456   Lactic acidosis 03/19/2013   Abdominal pain 03/19/2013   Rotavirus infection 10/29/2012   Type II or unspecified type diabetes mellitus without mention of complication, uncontrolled 10/29/2012   Protein-calorie malnutrition, severe ([place]) 10/27/2012   NSTEMI (non-ST elevated myocardial infarction) ([place]) 10/26/2012   Fever, unspecified 10/26/2012   Hypotension XX123456   Metabolic acidosis XX123456   Chronic diarrhea 10/25/2012   Tobacco abuse 10/25/2012   DKA (diabetic ketoacidoses) ([place]) 09/09/2012   Dehydration 09/09/2012   DKA, type 2 ([place]) 05/20/2012   Abnormal LFTs 05/20/2012   Heart murmur, systolic [id number]   Hypoglycemia XX123456   Metabolic encephalopathy XX123456   Alcohol abuse 07/08/2011   Hypokalemia 07/08/2011   Nausea & vomiting 07/08/2011   H/O chronic pancreatitis 07/08/2011    Past Surgical History:  Procedure Laterality Date   AV FISTULA PLACEMENT Left 12/15/2017   Procedure: INSERTION OF ARTERIOVENOUS (AV) GORE-TEX GRAFT ARM;  Surgeon: Rosetta Posner, MD;  [facility];  Service: Vascular;  Laterality: Left;   CATARACT EXTRACTION W/PHACO Right 03/14/2015   Procedure: CATARACT EXTRACTION PHACO AND INTRAOCULAR LENS PLACEMENT ([place]);  Surgeon: Baruch Goldmann, MD;  Location: [facility];  Service: Ophthalmology;  Laterality: Right;  CDE:11.13   CATARACT  EXTRACTION W/PHACO Left 04/11/2015   Procedure: CATARACT EXTRACTION PHACO AND INTRAOCULAR LENS PLACEMENT LEFT EYE CDE=9.68;  Surgeon: Baruch Goldmann, MD;  Location: [facility];  Service: Ophthalmology;  Laterality: Left;   COLONOSCOPY  02/24/2010   cystoscopy with ureteral stent  Bilateral 10/07/2017   At [facility] CV LINE RIGHT  11/17/2017   IR FLUORO GUIDE CV LINE RIGHT  11/22/2017   IR  REMOVAL TUN CV CATH W/O FL  01/26/2018   IR US GUIDE VASC ACCESS RIGHT  11/17/2017   MULTIPLE EXTRACTIONS WITH ALVEOLOPLASTY N/A 06/13/2012   Procedure: MULTIPLE EXTRACION WITH ALVEOLOPLASTY EXTRACT: 18, 19, 20, 21, 22, 24, 25, 27, 28, 29, 30, 31;  Surgeon: Gae Bon, DDS;  Location: [facility];  Service: Oral Surgery;  Laterality: N/A;   TUBAL LIGATION     URETERAL STENT PLACEMENT  09/2017     OB History    Gravida      Para      Term      Preterm      AB      Living  2     SAB      TAB      Ectopic      Multiple      Live Births              Family History  Problem Relation Age of Onset   Diabetes Sister    Chronic Renal Failure Neg Hx     Social History   Tobacco Use   Smoking status: Never Smoker   Smokeless tobacco: Current User    Types: Snuff  Substance Use Topics   Alcohol use: Not Currently    Alcohol/week: 1.0 standard drinks    Types: 1 Cans of beer per week   Drug use: No    Home Medications Prior to Admission medications   Medication Sig Start Date End Date Taking? Authorizing Provider  albuterol (PROAIR HFA) 108 (90 Base) MCG/ACT inhaler Inhale 2 puffs into the lungs every 4 (four) hours as needed for wheezing or shortness of breath.   Yes [provider]  calcitRIOL (ROCALTROL) 0.25 MCG capsule Take 1 capsule (0.25 mcg total) by mouth Every Tuesday,Thursday,and Saturday with dialysis. 12/18/17  Yes Love, Ivan Anchors, PA-C  carvedilol (COREG) 6.25 MG tablet Take 1 tablet (6.25 mg total) by mouth 2 (two) times daily. 11/15/18  Yes Buriev, Arie Sabina, MD  cinacalcet (SENSIPAR) 30 MG tablet Take 30 mg by mouth every evening.   Yes [provider]  dextrose (GLUTOSE) 40 % GEL Take 1 Tube by mouth every 10 (ten) minutes as needed for low blood sugar (unitl blood sugar is above 70).   Yes [provider]  ferric citrate (AURYXIA) 1 GM 210 MG(Fe) tablet Take 2 tablets (420 mg total) by mouth 3 (three) times daily with  meals. 11/15/18  Yes Buriev, Arie Sabina, MD  folic acid (FOLVITE) 1 MG tablet Take 1 mg by mouth every morning.   Yes [provider]  hydroxypropyl methylcellulose / hypromellose (ISOPTO TEARS / GONIOVISC) 2.5 % ophthalmic solution Place 1 drop into both eyes daily as needed for dry eyes.   Yes [provider]  insulin glargine (LANTUS) 100 UNIT/ML injection Inject 15 Units into the skin every morning.   Yes [provider]  insulin lispro (HUMALOG) 100 UNIT/ML injection Inject 2-10 Units into the skin 4 (four) times daily. Per sliding scale: CBG 151-200 2 units, 201-250  4 units, 251-300 6 units, 301-350 8 units, 351-400 10 units,   Yes [provider]  levETIRAcetam (KEPPRA) 1000 MG tablet Take 1,000 mg by mouth every morning.   Yes [provider]  levETIRAcetam (KEPPRA) 500 MG tablet Take 1 tablet (500 mg total) by mouth Every Tuesday,Thursday,and Saturday with dialysis. 500 mg, Oral, Every T-Th-Sa (Hemodialysis), First dose (after last modification) on Tue 11/15/18 at 1200, 11/15/18  Yes Buriev, Arie Sabina, MD  Nutritional Supplements (FEEDING SUPPLEMENT, NEPRO CARB STEADY,) LIQD Take 237 mLs by mouth 3 (three) times daily as needed (Supplement). 11/15/18  Yes Buriev, Arie Sabina, MD  ondansetron (ZOFRAN ODT) 4 MG disintegrating tablet Take 1 tablet (4 mg total) by mouth every 8 (eight) hours as needed for nausea or vomiting. 02/02/19  Yes Recardo Evangelist, PA-C  oxycodone (OXY-IR) 5 MG capsule Take 5 mg by mouth every 8 (eight) hours as needed for pain.   Yes [provider]  phenytoin (DILANTIN INFATABS) 50 MG tablet Chew 150 mg by mouth 2 (two) times daily.   Yes [provider]  sodium bicarbonate 650 MG tablet Take 650 mg by mouth 4 (four) times daily. For indigestion - midnight, 6am, noon and 6pm   Yes [provider]  topiramate (TOPAMAX) 25 MG tablet Take 1 tablet (25 mg total) by mouth 2 (two) times daily. 11/29/17 11/12/19  Yes Arrien, Jimmy Picket, MD  Vitamin D, Ergocalciferol, (DRISDOL) 1.25 MG (50000 UT) CAPS capsule Take 50,000 Units by mouth every Friday.   Yes [provider]  HYDROcodone-acetaminophen (NORCO/VICODIN) 5-325 MG tablet Take 1 tablet by mouth every 6 (six) hours as needed for severe pain. Patient not taking: Reported on 02/02/2019 12/12/18   Evalee Jefferson, PA-C    Allergies    Aspirin  Review of Systems   Review of Systems  Unable to perform ROS: Acuity of condition    Physical Exam Updated Vital Signs BP (!) 183/92    Pulse (!) 57    Temp (!) 91.7 F (33.2 C) (Rectal) Comment: bear hugger applied   Resp 19    SpO2 99%   Physical Exam Vitals and nursing note reviewed.  Constitutional:      Appearance: She is well-developed.     Comments: Obtunded  HENT:     Head: Normocephalic and atraumatic.  Eyes:     Conjunctiva/sclera: Conjunctivae normal.  Cardiovascular:     Rate and Rhythm: Normal rate and regular rhythm.  Pulmonary:     Effort: Pulmonary effort is normal.     Breath sounds: Normal breath sounds.  Abdominal:     General: Bowel sounds are normal.     Palpations: Abdomen is soft.  Musculoskeletal:     Cervical back: Neck supple.     Comments: Unable  Skin:    Comments: Cool to touch  Neurological:     Comments: Unable  Psychiatric:     Comments: Unable     ED Results / Procedures / Treatments   Labs (all labs ordered are listed, but only abnormal results are displayed) Labs Reviewed  CBG MONITORING, ED - Abnormal; Notable for the following components:      Result Value   Glucose-Capillary 318 (*)    All other components within normal limits    EKG None  Radiology No results found.  Procedures Procedures (including critical care time)  Medications Ordered in ED Medications  dextrose 50 % solution ( Intraosseous Given by Other 02/04/19 1749)  glucagon (human recombinant) (GLUCAGEN) 1  MG injection (  Given 02/04/19 1745)  dextrose 50  % solution (  Given 02/04/19 1805)    ED Course  I have reviewed the triage vital signs and the nursing notes.  Pertinent labs & imaging results that were available during my care of the patient were reviewed by me and considered in my medical decision making (see chart for details).    MDM Rules/Calculators/A&P                      Patient profoundly hypoglycemic.  IV access was challenging.  Intraosseous IV initiated.  Glucagon 1 mg IM and dextrose 50 ampoule given IV.  Patient became more alert.  She was able to consume liquid sugary beverages.  Patient rechecked multiple times in her emergency department course.  Her temperature increased.  She was alert and interactive.  Glucose remained stable.  Patient will be discharged home with family.  CRITICAL CARE Performed by: Nat Christen Total critical care time:60 minutes Critical care time was exclusive of separately billable procedures and treating other patients. Critical care was necessary to treat or prevent imminent or life-threatening deterioration. Critical care was time spent personally by me on the following activities: development of treatment plan with patient and/or surrogate as well as nursing, discussions with consultants, evaluation of patient's response to treatment, examination of patient, obtaining history from patient or surrogate, ordering and performing treatments and interventions, ordering and review of laboratory studies, ordering and review of radiographic studies, pulse oximetry and re-evaluation of patient's condition. Final Clinical Impression(s) / ED Diagnoses Final diagnoses:  Hypoglycemia  ESRD (end stage renal disease) [facility])    Rx / DC Orders ED Discharge Orders    None       Nat Christen, MD 02/04/19 Yevette Edwards    Nat Christen, MD 02/04/19 2156

## 2019-02-04 NOTE — ED Notes (Signed)
Report given to RN at [facility].

## 2019-02-04 NOTE — ED Notes (Signed)
Dr Cook at bedside

## 2019-02-16 LAB — CBG MONITORING, ED: Glucose-Capillary: 21 mg/dL — CL (ref 70–99)

## 2019-04-06 ENCOUNTER — Inpatient Hospital Stay
Admission: AD | Admit: 2019-04-06 | Payer: Medicaid Other | Source: Other Acute Inpatient Hospital | Admitting: Internal Medicine

## 2019-04-08 ENCOUNTER — Emergency Department (HOSPITAL_COMMUNITY): Payer: Medicaid Other

## 2019-04-08 ENCOUNTER — Other Ambulatory Visit: Payer: Self-pay

## 2019-04-08 ENCOUNTER — Emergency Department (HOSPITAL_COMMUNITY)
Admission: EM | Admit: 2019-04-08 | Discharge: 2019-04-08 | Disposition: A | Discharge status: Home / Self Care | Payer: Medicaid Other | Attending: Emergency Medicine | Admitting: Emergency Medicine

## 2019-04-08 ENCOUNTER — Encounter (HOSPITAL_COMMUNITY): Payer: Self-pay

## 2019-04-08 DIAGNOSIS — I252 Old myocardial infarction: Secondary | ICD-10-CM | POA: Insufficient documentation

## 2019-04-08 DIAGNOSIS — S4991XA Unspecified injury of right shoulder and upper arm, initial encounter: Secondary | ICD-10-CM | POA: Diagnosis present

## 2019-04-08 DIAGNOSIS — N186 End stage renal disease: Secondary | ICD-10-CM | POA: Insufficient documentation

## 2019-04-08 DIAGNOSIS — Z79899 Other long term (current) drug therapy: Secondary | ICD-10-CM | POA: Insufficient documentation

## 2019-04-08 DIAGNOSIS — Z992 Dependence on renal dialysis: Secondary | ICD-10-CM | POA: Diagnosis not present

## 2019-04-08 DIAGNOSIS — F17220 Nicotine dependence, chewing tobacco, uncomplicated: Secondary | ICD-10-CM | POA: Insufficient documentation

## 2019-04-08 DIAGNOSIS — Y939 Activity, unspecified: Secondary | ICD-10-CM | POA: Diagnosis not present

## 2019-04-08 DIAGNOSIS — Y92129 Unspecified place in nursing home as the place of occurrence of the external cause: Secondary | ICD-10-CM | POA: Diagnosis not present

## 2019-04-08 DIAGNOSIS — Y999 Unspecified external cause status: Secondary | ICD-10-CM | POA: Insufficient documentation

## 2019-04-08 DIAGNOSIS — Z794 Long term (current) use of insulin: Secondary | ICD-10-CM | POA: Diagnosis not present

## 2019-04-08 DIAGNOSIS — I132 Hypertensive heart and chronic kidney disease with heart failure and with stage 5 chronic kidney disease, or end stage renal disease: Secondary | ICD-10-CM | POA: Insufficient documentation

## 2019-04-08 DIAGNOSIS — Z8673 Personal history of transient ischemic attack (TIA), and cerebral infarction without residual deficits: Secondary | ICD-10-CM | POA: Insufficient documentation

## 2019-04-08 DIAGNOSIS — I5032 Chronic diastolic (congestive) heart failure: Secondary | ICD-10-CM | POA: Insufficient documentation

## 2019-04-08 DIAGNOSIS — S42291A Other displaced fracture of upper end of right humerus, initial encounter for closed fracture: Secondary | ICD-10-CM

## 2019-04-08 DIAGNOSIS — E1122 Type 2 diabetes mellitus with diabetic chronic kidney disease: Secondary | ICD-10-CM | POA: Insufficient documentation

## 2019-04-08 DIAGNOSIS — W1830XA Fall on same level, unspecified, initial encounter: Secondary | ICD-10-CM | POA: Insufficient documentation

## 2019-04-08 DIAGNOSIS — W19XXXA Unspecified fall, initial encounter: Secondary | ICD-10-CM

## 2019-04-08 MED ORDER — HYDROCODONE-ACETAMINOPHEN 5-325 MG PO TABS: 1.0000 | ORAL_TABLET | ORAL | 0 refills | Status: DC | PRN

## 2019-04-08 MED ORDER — HYDROCODONE-ACETAMINOPHEN 5-325 MG PO TABS
1.0000 | ORAL_TABLET | Freq: Once | ORAL | Status: AC
  Administered 2019-04-08: 1 via ORAL
  Filled 2019-04-08: qty 1

## 2019-04-08 NOTE — ED Provider Notes (Addendum)
[facility] EMERGENCY DEPARTMENT Provider Note   CSN: [id number] Arrival date & time: 04/08/19  1843     History Chief Complaint  Patient presents with  . Fall    Erica [name] is a 64 y.o. female.  Chief complaint fall with right upper extremity pain just prior to ED visit.  No head or neck trauma.  No other obvious extremity issues.  Severity of pain is moderate.  Range of motion makes symptoms worse.        Past Medical History:  Diagnosis Date  . Alcohol-induced pancreatitis   . Chronic diarrhea   . Depression   . Diabetes mellitus    fasting blood sugar 110-120s  . Diastolic CHF ([employer])   . DKA (diabetic ketoacidoses) ([place])   . Gastroparesis   . GERD (gastroesophageal reflux disease)   . Heart murmur   . History of kidney stones   . Hyperlipidemia   . Hypertension   . Hypokalemia   . Muscle spasm   . Neuropathic pain   . Neuropathy    Hx: of  . Pyelonephritis   . Seizures ([place])   . Vitamin B12 deficiency   . Vitamin D deficiency     Patient Active Problem List   Diagnosis Date Noted  . Seizure ([place]) 11/12/2018  . History of hydronephrosis --stents in place 12/08/2017  . Stroke ([place]) 11/29/2017  . Seizures ([place])   . Hydronephrosis   . Recurrent UTI   . Diabetes mellitus type 2 in nonobese (HCC)   . ESRD (end stage renal disease) ([place])   . Anemia of chronic disease   . Chronic diastolic congestive heart failure ([place])   . Essential hypertension   . PICC (peripherally inserted central catheter) in place   . Acute pyelonephritis 11/12/2017  . Uncontrolled type 2 diabetes mellitus with hyperglycemia, with long-term current use of insulin ([place]) 11/10/2017  . Renal failure 11/08/2017  . Seizure disorder ([place]) 11/08/2017  . Orthostatic hypotension 07/25/2014  . Moderate protein malnutrition ([place]) 09/15/2013  . Aspiration pneumonia ([place]) 09/15/2013  . Acute respiratory failure requiring reintubation ([place]) 09/11/2013  . probable Seizures due to  metabolic disorder XX123456  . Lactic acidosis 03/19/2013  . Abdominal pain 03/19/2013  . Rotavirus infection 10/29/2012  . Type II or unspecified type diabetes mellitus without mention of complication, uncontrolled 10/29/2012  . Protein-calorie malnutrition, severe ([place]) 10/27/2012  . NSTEMI (non-ST elevated myocardial infarction) ([place]) 10/26/2012  . Fever, unspecified 10/26/2012  . Hypotension 10/25/2012  . Metabolic acidosis XX123456  . Chronic diarrhea 10/25/2012  . Tobacco abuse 10/25/2012  . DKA (diabetic ketoacidoses) ([place]) 09/09/2012  . Dehydration 09/09/2012  . DKA, type 2 ([place]) 05/20/2012  . Abnormal LFTs 05/20/2012  . Heart murmur, systolic [id number]  . Hypoglycemia 07/08/2011  . Metabolic encephalopathy XX123456  . Alcohol abuse 07/08/2011  . Hypokalemia 07/08/2011  . Nausea & vomiting 07/08/2011  . H/O chronic pancreatitis 07/08/2011    Past Surgical History:  Procedure Laterality Date  . AV FISTULA PLACEMENT Left 12/15/2017   Procedure: INSERTION OF ARTERIOVENOUS (AV) GORE-TEX GRAFT ARM;  Surgeon: Rosetta Posner, MD;  Location: [facility];  Service: Vascular;  Laterality: Left;  . CATARACT EXTRACTION W/PHACO Right 03/14/2015   Procedure: CATARACT EXTRACTION PHACO AND INTRAOCULAR LENS PLACEMENT (I[facility]);  Surgeon: Baruch Goldmann, MD;  Location: [facility];  Service: Ophthalmology;  Laterality: Right;  CDE:11.13  . CATARACT EXTRACTION W/PHACO Left 04/11/2015   Procedure: CATARACT EXTRACTION PHACO AND INTRAOCULAR LENS PLACEMENT LEFT EYE CDE=9.68;  Surgeon:  Baruch Goldmann, MD;  Location: [facility];  Service: Ophthalmology;  Laterality: Left;  . COLONOSCOPY  02/24/2010  . cystoscopy with ureteral stent  Bilateral 10/07/2017   At [employer] CV LINE RIGHT  11/17/2017  . IR FLUORO GUIDE CV LINE RIGHT  11/22/2017  . IR REMOVAL TUN CV CATH W/O FL  01/26/2018  . IR US GUIDE VASC ACCESS RIGHT  11/17/2017  . MULTIPLE EXTRACTIONS WITH ALVEOLOPLASTY N/A 06/13/2012    Procedure: MULTIPLE EXTRACION WITH ALVEOLOPLASTY EXTRACT: 18, 19, 20, 21, 22, 24, 25, 27, 28, 29, 30, 31;  Surgeon: Gae Bon, DDS;  Location: [facility];  Service: Oral Surgery;  Laterality: N/A;  . TUBAL LIGATION    . URETERAL STENT PLACEMENT  09/2017     OB History    Gravida      Para      Term      Preterm      AB      Living  2     SAB      TAB      Ectopic      Multiple      Live Births              Family History  Problem Relation Age of Onset  . Diabetes Sister   . Chronic Renal Failure Neg Hx     Social History   Tobacco Use  . Smoking status: Never Smoker  . Smokeless tobacco: Current User    Types: Snuff  Substance Use Topics  . Alcohol use: Not Currently    Alcohol/week: 1.0 standard drinks    Types: 1 Cans of beer per week  . Drug use: No    Home Medications Prior to Admission medications   Medication Sig Start Date End Date Taking? Authorizing Provider  albuterol (PROAIR HFA) 108 (90 Base) MCG/ACT inhaler Inhale 2 puffs into the lungs every 4 (four) hours as needed for wheezing or shortness of breath.    [provider]  calcitRIOL (ROCALTROL) 0.25 MCG capsule Take 1 capsule (0.25 mcg total) by mouth Every Tuesday,Thursday,and Saturday with dialysis. 12/18/17   Love, Ivan Anchors, PA-C  carvedilol (COREG) 6.25 MG tablet Take 1 tablet (6.25 mg total) by mouth 2 (two) times daily. 11/15/18   Kinnie Feil, MD  cinacalcet (SENSIPAR) 30 MG tablet Take 30 mg by mouth every evening.    [provider]  dextrose (GLUTOSE) 40 % GEL Take 1 Tube by mouth every 10 (ten) minutes as needed for low blood sugar (unitl blood sugar is above 70).    [provider]  ferric citrate (AURYXIA) 1 GM 210 MG(Fe) tablet Take 2 tablets (420 mg total) by mouth 3 (three) times daily with meals. 11/15/18   Kinnie Feil, MD  folic acid (FOLVITE) 1 MG tablet Take 1 mg by mouth every morning.    [provider]    HYDROcodone-acetaminophen (NORCO/VICODIN) 5-325 MG tablet Take 1 tablet by mouth every 4 (four) hours as needed. 04/08/19   Nat Christen, MD  hydroxypropyl methylcellulose / hypromellose (ISOPTO TEARS / GONIOVISC) 2.5 % ophthalmic solution Place 1 drop into both eyes daily as needed for dry eyes.    [provider]  insulin glargine (LANTUS) 100 UNIT/ML injection Inject 15 Units into the skin every morning.    [provider]  insulin lispro (HUMALOG) 100 UNIT/ML injection Inject 2-10 Units into the skin 4 (four) times daily. Per sliding scale:  CBG 151-200 2 units, 201-250 4 units, 251-300 6 units, 301-350 8 units, 351-400 10 units,    [provider]  levETIRAcetam (KEPPRA) 1000 MG tablet Take 1,000 mg by mouth every morning.    [provider]  levETIRAcetam (KEPPRA) 500 MG tablet Take 1 tablet (500 mg total) by mouth Every Tuesday,Thursday,and Saturday with dialysis. 500 mg, Oral, Every T-Th-Sa (Hemodialysis), First dose (after last modification) on Tue 11/15/18 at 1200, 11/15/18   Buriev, Arie Sabina, MD  Nutritional Supplements (FEEDING SUPPLEMENT, NEPRO CARB STEADY,) LIQD Take 237 mLs by mouth 3 (three) times daily as needed (Supplement). 11/15/18   Kinnie Feil, MD  ondansetron (ZOFRAN ODT) 4 MG disintegrating tablet Take 1 tablet (4 mg total) by mouth every 8 (eight) hours as needed for nausea or vomiting. 02/02/19   Recardo Evangelist, PA-C  oxycodone (OXY-IR) 5 MG capsule Take 5 mg by mouth every 8 (eight) hours as needed for pain.    [provider]  phenytoin (DILANTIN INFATABS) 50 MG tablet Chew 150 mg by mouth 2 (two) times daily.    [provider]  sodium bicarbonate 650 MG tablet Take 650 mg by mouth 4 (four) times daily. For indigestion - midnight, 6am, noon and 6pm    [provider]  topiramate (TOPAMAX) 25 MG tablet Take 1 tablet (25 mg total) by mouth 2 (two) times daily. 11/29/17 11/12/19  Arrien, Jimmy Picket, MD   Vitamin D, Ergocalciferol, (DRISDOL) 1.25 MG (50000 UT) CAPS capsule Take 50,000 Units by mouth every Friday.    [provider]    Allergies    Aspirin  Review of Systems   Review of Systems  All other systems reviewed and are negative.   Physical Exam Updated Vital Signs BP (!) 200/81 (BP Location: Right Leg)   Pulse 73   Temp 98.7 F (37.1 C) (Oral)   Resp 16   Wt 40.4 kg   SpO2 99%   BMI 17.99 kg/m   Physical Exam Vitals and nursing note reviewed.  Constitutional:      Appearance: She is well-developed.  HENT:     Head: Normocephalic and atraumatic.  Eyes:     Conjunctiva/sclera: Conjunctivae normal.  Cardiovascular:     Rate and Rhythm: Normal rate and regular rhythm.  Pulmonary:     Effort: Pulmonary effort is normal.     Breath sounds: Normal breath sounds.  Abdominal:     General: Bowel sounds are normal.     Palpations: Abdomen is soft.  Musculoskeletal:     Cervical back: Neck supple.     Comments: Right upper extremity: Swelling proximal humerus.  Decreased range of motion.  Skin:    General: Skin is warm and dry.  Neurological:     General: No focal deficit present.     Mental Status: She is alert and oriented to person, place, and time.  Psychiatric:        Behavior: Behavior normal.     ED Results / Procedures / Treatments   Labs (all labs ordered are listed, but only abnormal results are displayed) Labs Reviewed - No data to display  EKG None  Radiology DG Chest Portable 1 View  Result Date: 04/08/2019 CLINICAL DATA:  64 year old female with fall. EXAM: PORTABLE CHEST 1 VIEW COMPARISON:  Chest radiograph dated 04/06/2019. FINDINGS: Mild cardiomegaly. No significant congestion or edema. No focal consolidation, pleural effusion, or pneumothorax. Displaced and angulated fracture of the proximal right humeral diaphysis. IMPRESSION: 1. No acute cardiopulmonary  process. 2. Right humeral fracture. Electronically Signed   By: Anner Crete M.D.   On: 04/08/2019 19:55   DG Shoulder Right Portable  Result Date: 04/08/2019 CLINICAL DATA:  64 year old female with fall and trauma to the right shoulder. EXAM: PORTABLE RIGHT SHOULDER COMPARISON:  Chest radiograph dated 02/02/2019. FINDINGS: There is a displaced fracture of the proximal right humeral diaphysis. There is proximal migration of the humeral shaft. The bones are osteopenic. There is no dislocation. The soft tissues are unremarkable. IMPRESSION: Displaced fracture of the proximal humeral diaphysis. Electronically Signed   By: Anner Crete M.D.   On: 04/08/2019 19:35    Procedures Procedures (including critical care time)  Medications Ordered in ED Medications  HYDROcodone-acetaminophen (NORCO/VICODIN) 5-325 MG per tablet 1 tablet (1 tablet Oral Given 04/08/19 2143)    ED Course  I have reviewed the triage vital signs and the nursing notes.  Pertinent labs & imaging results that were available during my care of the patient were reviewed by me and considered in my medical decision making (see chart for details).    MDM Rules/Calculators/A&P                      Status post recent fall with right shoulder pain.  Plain films reveal a displaced proximal right humeral fracture.  Discussed with orthopedic surgeon on-call Dr. Mardelle Matte.  He recommended a long-arm cast in attempt to allow gravity to pull fracture fragment into place.  Follow-up with orthopedics on Monday.  Vicodin for pain.  2230: EMS was reluctant to transfer patient secondary to a blood pressure of 200/81.  Blood pressure cuff was applied to the leg because of her fractured humerus on the right arm and dialysis fistula on the left arm.  Patient is exhibiting  no signs of hypertensive emergency.  This is consistent with isolated systolic hypertension.  No reason to treat this more aggressively Final Clinical Impression(s) / ED Diagnoses Final diagnoses:  Fall, initial encounter  Humerus head  fracture, right, closed, initial encounter    Rx / DC Orders ED Discharge Orders         Ordered    HYDROcodone-acetaminophen (NORCO/VICODIN) 5-325 MG tablet  Every 4 hours PRN     04/08/19 2152           Nat Christen, MD 04/08/19 2154    Nat Christen, MD 04/08/19 2232

## 2019-04-08 NOTE — ED Notes (Signed)
EMS concerned due to pt's BP on arrival to transport pt back to facility.  EMS and EDP spoke concerning pt's BP, that pt with hx of isolated systolic HTN.  EDP stated that pt could be transported back and no treatment needed at this time.  Called report to [facility], LPN refused to accept pt back due to pt's elevated BP.  Reported that they would call DON of facility and call pt's NP as well.  Reported they would call back to state if they would take pt back with her BP.  No call back by this time.  Report given to Vivien Rota, RN and pt moved to ED room 7.

## 2019-04-08 NOTE — ED Notes (Signed)
Verified with LPN at facility that they will accept pt back at this time.

## 2019-04-08 NOTE — ED Triage Notes (Signed)
Pt recently d/c from [facility]. Pt ESRD, has fistula on left foerarm. Pt fell and hit shoulder. Denies hitting head.  Poss dislocation to right shoulder

## 2019-04-08 NOTE — Progress Notes (Signed)
Called regarding right proximal humerus fracture. Patient with closed injury, acute pain on the over the lateral side of the right shoulder. X-rays with significant deformity.  Recommended hanging arm cast equivalent, with a coaptation splint or long-arm posterior splint, with post splinting x-rays to assess what I would expect is improved alignment, sling, and follow-up with me early next week. I will be in the office on Monday, Wednesday, and Friday.  Marchia Bond, MD

## 2019-04-08 NOTE — Discharge Instructions (Signed)
Erica [name] has broken her proximal right humerus.  I discussed her case with the orthopedic surgeon on-call Dr. Mardelle Matte.  He will see you on Monday.  The nursing home needs to call Monday morning for an appointment.  Phone number given.  Cast applied.  Prescription for pain medicine.  Ice.

## 2019-04-08 NOTE — ED Notes (Signed)
Deformity noted to right shoulder, pt also c/o pain to that right shoulder as well.

## 2019-04-17 ENCOUNTER — Other Ambulatory Visit (HOSPITAL_COMMUNITY)
Admission: RE | Admit: 2019-04-17 | Discharge: 2019-04-17 | Disposition: A | Discharge status: Home / Self Care | Payer: Medicaid Other | Source: Ambulatory Visit | Attending: Orthopedic Surgery | Admitting: Orthopedic Surgery

## 2019-04-17 DIAGNOSIS — Z01812 Encounter for preprocedural laboratory examination: Secondary | ICD-10-CM | POA: Diagnosis present

## 2019-04-17 DIAGNOSIS — Z20822 Contact with and (suspected) exposure to covid-19: Secondary | ICD-10-CM | POA: Diagnosis not present

## 2019-04-17 LAB — SARS CORONAVIRUS 2 (TAT 6-24 HRS): SARS Coronavirus 2: NEGATIVE

## 2019-04-18 ENCOUNTER — Other Ambulatory Visit: Payer: Self-pay

## 2019-04-18 NOTE — Progress Notes (Signed)
Pt is a resident at [facility]. Spoke with Glenard Haring, LPN and verified pt's medical history. Pharmacy had already updated medications. Pt has hx of CHF and [facility] states pt has not had any recent flare ups with CHF. Pt is a type 2 diabetic. Last A1C was 5.9 on 04/08/19. [facility] states pt's fasting blood sugar has ranged from 89-242 in the past several days. Pt is on dialysis and had it today. Pt is in quarantine at the facility and had a negative Covid test done on 04/17/19.  Faxed pre-op instructions to [employer] at [phone].

## 2019-04-18 NOTE — Pre-Procedure Instructions (Signed)
   Erica [name]  04/18/2019    Erica [name] procedure is scheduled on Thursday,  04/20/19 at 8:15 AM.   Report to [facility] Entrance "A" Admitting Office at 5:45 AM.   Call this number if you have problems the morning of surgery: [phone]   Remember:  Erica [name] is not to eat food after midnight Wednesday, 04/19/19.  She may drink clear liquids until 5:15 AM.  Clear liquids allowed are: Water, Juice (non-citric and without pulp), Carbonated beverages, Clear Tea, Black Coffee only, Plain Jell-O only, Gatorade and Plain Popsicles only   Please have Erica [name] drink 10 ounces of water just before 5:15 AM Thursday and then she will be NPO afterwards    Take these medicines the morning of surgery with A SIP OF WATER: Carvediol (Coreg), Levetiracetam (Keppra), Phenytoin (Dilantin), Topiramate (Topamax), Oxycodone - prn, Acetaminophen - prn, eye drops - prn, Albuterol (ProAir) inhaler - prn (have Erica [name] bring inhaler with her day of surgery)  Please check Erica [name] blood sugar when she gets up Thursday AM. If blood sugar is >220 take 1/2 of usual correction dose of Humalog insulin. If blood sugar is 70 or below, treat with 1/2 cup of clear juice (apple or cranberry) and recheck blood sugar 15 minutes after drinking juice or the Dextrose gel can be used. If blood sugar continues to be 70 or below, call the Short Stay department and ask to speak to a nurse.  Please hold Melatonin as of today prior to surgery.  Per instructions Dr. Albertha Ghee, Anesthesiologist/Collis Thede Stan Head, RN    Do not wear jewelry, make-up or nail polish.  Do not wear lotions, powders, perfumes or deodorant.  Do not shave 48 hours prior to surgery.    Do not bring valuables to the hospital.  [facility] is not responsible for any belongings or valuables.  Contacts, dentures or bridgework may not be worn into surgery.   Patients discharged the day of surgery will not be allowed to drive home.

## 2019-04-19 ENCOUNTER — Encounter (HOSPITAL_COMMUNITY): Payer: Self-pay | Admitting: Orthopedic Surgery

## 2019-04-19 NOTE — Anesthesia Preprocedure Evaluation (Addendum)
Anesthesia Evaluation  Patient identified by MRN, date of birth, ID band Patient awake    Reviewed: Allergy & Precautions, NPO status , Patient's Chart, lab work & pertinent test results  History of Anesthesia Complications Negative for: history of anesthetic complications  Airway Mallampati: II  TM Distance: >3 FB Neck ROM: Full    Dental  (+) Edentulous Upper, Edentulous Lower   Pulmonary neg pulmonary ROS,    Pulmonary exam normal        Cardiovascular hypertension, +CHF  Normal cardiovascular exam     Neuro/Psych Seizures -,  PSYCHIATRIC DISORDERS Depression CVA (2019)    GI/Hepatic GERD  ,(+)     substance abuse  alcohol use, gastroparesis   Endo/Other  diabetes, Insulin Dependent  Renal/GU ESRF and DialysisRenal disease  negative genitourinary   Musculoskeletal negative musculoskeletal ROS (+)   Abdominal   Peds  Hematology negative hematology ROS (+)   Anesthesia Other Findings Echo 2019: EF 60-65%, gr1 dd, chordal but not valvular SAM, PASP 50, LVH due to HTN versus a variant of hypertrophic cardiomyopathy  Reproductive/Obstetrics                           Anesthesia Physical Anesthesia Plan  ASA: IV  Anesthesia Plan: General   Post-op Pain Management: GA combined w/ Regional for post-op pain   Induction: Intravenous  PONV Risk Score and Plan: 3 and Ondansetron, Dexamethasone, Treatment may vary due to age or medical condition and Midazolam  Airway Management Planned: Oral ETT  Additional Equipment: None  Intra-op Plan:   Post-operative Plan: Extubation in OR  Informed Consent: I have reviewed the patients History and Physical, chart, labs and discussed the procedure including the risks, benefits and alternatives for the proposed anesthesia with the patient or authorized representative who has indicated his/her understanding and acceptance.     Dental advisory  given  Plan Discussed with:   Anesthesia Plan Comments: (See PAT note written 04/19/2019 by Myra Gianotti, PA-C. )      Anesthesia Quick Evaluation

## 2019-04-19 NOTE — Progress Notes (Signed)
Anesthesia Chart Review: SAME DAY WORK-UP    Case: [id number] Date/Time: 04/20/19 0800   Procedure: OPEN REDUCTION INTERNAL FIXATION (ORIF) PROXIMAL HUMERUS FRACTURE (Right )   Anesthesia type: Choice   Pre-op diagnosis: right proximal humerus fracture   Location: [facility] OR ROOM 04 / [place] [facility]   Surgeons: Marchia Bond, MD      DISCUSSION: Patient is a 64 year old female scheduled for the above procedure. She is a resident at [facility]. Last ED visit 04/08/19 for fall with right proximal humerus fracture. Hanging arm case equivalent applied with out-patient orthopedic follow-up.  History includes never smoker (uses snuff), DM on insulin (history of DKA), neuropathy, gastroparesis, murmur, chronic diastolic CHF, HTN, seizures, chronic diarrhea, alcoholic induced pancreatitis, ESRD (HD TTS at [facility]).   Regarding seizure history: In review of neurology consult note from 11/13/18, she has had hypoglycemic induced seizures since 2015 and had breakthrough seizures since starting hemodialysis ~ 11/2017. She has been on anti-seizure medications. Other notes spanning several years suggest alcohol abuse, missed hemodialysis, and hyponatremia could also be playing a role. Acute CVA was also noted following 11/15/17 seizure. No current alcohol use documented. She is currently on Keppra, Dilantin, and Topamax.  Frequent hospitalizations (does not include numerous ED evaluations) over the past several years include:  - Hospitalization 04/07/19-04/08/19 for hyperkalemia in setting of missed hemodialysis, UTI, acute encephalopathy. - Hospitalization 11/12/18-11/06/18 for seizure, hypoglycemia, missed hemodialysis. - Hospitalization 04/09/19-04/19/18 [facility]) for fall with right femoral neck fracture s/p right hemiarthroplasty 04/11/18. Nephrology managed ESRD/dialysis. Also seen by hematology for polycythemia and thrombocytopenia requiring therapeutic phlebotomy. - Hospitalization 11/08/17-11/29/17 for  pyelonephritis and acute tubular necrosis (s/p ureteral stents 10/07/17), acute on chronic kidney disease requiring hemodialysis. Generalized seizure while in dialysis.  As part of seizure work-up she underwent MRI which showed acute infarcts.  She had subtle left-sided weakness. [facility] stay 11/29/17-12/16/17. S/p LUE AVGG 12/15/17. - Hospitalization 07/2014 for lactic acidosis, polypharmacy, alcohol abuse - Hospitalization 09/2013 for uncontrolled DM with seizures secondary to hypoglycemia. - Hospitalization 03/2013 for N/V, chronic pancreatitis, lactic acidosis, malnutrition - Hospitalization 0000000 for metabolic encephalopathy, NSTEMI (cardiology felt demand ischemia, EF normal), alcohol abuse, mild DKA.  - Hospitalization 08/2012 for DKA, RLL HCAP. - Hospitalization 05/2012 for DKA after pre-operative labs (for dental extractions) showed glucose 920, K 2.7, AST 717, anion gap 19.  - Hospitalization [id number] for metabolic encephalopathy secondary to hypoglycemia induced from alcohol intoxication.    Preoperative COVID-19 test negative on 04/17/2019. Hemodialysis on 04/18/19 per PAT RN note. No recent CHF exacerbations per SNF staff. She is a same-day work-up so anesthesia team to evaluate on the day of surgery.    VS: At [place]: Blood Pressure 153/60 04/08/2019 12:31 PM EST   Pulse 82 04/08/2019 12:31 PM EST   Temperature 36.9 C (98.5 F) 04/08/2019 8:36 AM EST   Respiratory Rate 20 04/08/2019 8:36 AM EST   Oxygen Saturation 97% 04/08/2019 12:31 PM EST   Inhaled Oxygen Concentration - -   Weight 50.2 kg (110 lb 9.6 oz) 04/07/2019 10:36 AM EST   Height 152.4 cm (5') 04/07/2019 11:07 AM EST   Body Mass Index 21.6 04/07/2019 10:36 AM EST      PROVIDERS: Scotty Court, DO is PCP  - She is not followed by cardiology, but saw Carlyle Dolly, MD during 10/2012 hospitalization as mentioned above.    LABS: She is for labs on the day of surgery. Most recent labs from [place] show  A1c 5.9%, WBC of 6.1, hemoglobin 8.6, hematocrit 26.5, platelet count 227, sodium 139, potassium 3.5, glucose 130, BUN 29 creatinine 3.65 on 04/08/19.     OTHER: EEG 11/14/18: IMPRESSION: This study is within normal limits. No seizures or epileptiform discharges were seen throughout the recording.   IMAGES: 1V PCXR 04/08/19: FINDINGS: Mild cardiomegaly. No significant congestion or edema. No focal consolidation, pleural effusion, or pneumothorax. Displaced and angulated fracture of the proximal right humeral diaphysis. IMPRESSION: 1. No acute cardiopulmonary process. 2. Right humeral fracture.  Xray right shoulder 04/08/19: FINDINGS: There is a displaced fracture of the proximal right humeral diaphysis. There is proximal migration of the humeral shaft. The bones are osteopenic. There is no dislocation. The soft tissues are unremarkable. IMPRESSION: Displaced fracture of the proximal humeral diaphysis.   EKG: 02/02/19: Sinus rhythm Borderline short PR interval Nonspecific intraventricular conduction delay Anteroseptal infarct, old Minimal ST depression, inferior leads No STEMI Confirmed by Nanda Quinton [phone]) on 02/02/2019 12:40:17 PM   CV: Echo 11/17/17: Study Conclusions  - Left ventricle: The cavity size was normal. Moderate focal basal  septal hypertrophy. Mild hypertrophy of the rest of the  ventricle. No LV outflow tract gradient noted. Systolic function  was normal. The estimated ejection fraction was in the range of  60% to 65%. Wall motion was normal; there were no regional wall  motion abnormalities. Doppler parameters are consistent with  abnormal left ventricular relaxation (grade 1 diastolic  dysfunction).  - Aortic valve: There was no stenosis. There was trivial  regurgitation.  - Mitral valve: There was chordal systolic anterior motion but not  valvular SAM. Moderately calcified annulus. There was trivial   regurgitation.  - Right ventricle: The cavity size was normal. Systolic function  was normal.  - Right atrium: Catheter noted in RA.  - Tricuspid valve: Peak RV-RA gradient (S): 47 mm Hg.  - Pulmonary arteries: PA peak pressure: 50 mm Hg (S).  - Inferior vena cava: The vessel was normal in size. The  respirophasic diameter changes were in the normal range (>= 50%),  consistent with normal central venous pressure.  Impressions:  - Normal LV size with moderate asymmetric basal septal hypertrophy,  mild hypertrophy of the rest of the ventricle. No LVOT gradient.  There was chordal but not mitral valve SAM. Normal RV size and  systolic function. No significant valvular abnormalities.  Differential is LVH due to HTN versus a variant of hypertrophic  cardiomyopathy.    Carotid 11/17/17: Final Interpretation:  Right Carotid: Velocities in the right ICA are consistent with a 1-39%  stenosis.  Left Carotid: Velocities in the left ICA are consistent with a 1-39%  stenosis.  Vertebrals: Bilateral vertebral arteries demonstrate antegrade flow.    Past Medical History:  Diagnosis Date  . Alcohol-induced pancreatitis   . Chronic diarrhea   . Depression   . Diabetes mellitus    fasting blood sugar 110-120s  . Diastolic CHF ([place])   . DKA (diabetic ketoacidoses) ([place])   . Gastroparesis   . GERD (gastroesophageal reflux disease)   . Heart murmur   . History of kidney stones   . Hyperlipidemia   . Hypertension   . Hypokalemia   . Muscle spasm   . Neuropathic pain   . Neuropathy    Hx: of  . Pyelonephritis   . Seizures ([place])   . Vitamin B12 deficiency   . Vitamin D deficiency     Past Surgical History:  Procedure Laterality Date  . AV FISTULA  PLACEMENT Left 12/15/2017   Procedure: INSERTION OF ARTERIOVENOUS (AV) GORE-TEX GRAFT ARM;  Surgeon: Rosetta Posner, MD;  Location: [place];  Service: Vascular;  Laterality: Left;  . CATARACT EXTRACTION W/PHACO Right  03/14/2015   Procedure: CATARACT EXTRACTION PHACO AND INTRAOCULAR LENS PLACEMENT (IOC);  Surgeon: Baruch Goldmann, MD;  Location: [facility];  Service: Ophthalmology;  Laterality: Right;  CDE:11.13  . CATARACT EXTRACTION W/PHACO Left 04/11/2015   Procedure: CATARACT EXTRACTION PHACO AND INTRAOCULAR LENS PLACEMENT LEFT EYE CDE=9.68;  Surgeon: Baruch Goldmann, MD;  Location: [facility];  Service: Ophthalmology;  Laterality: Left;  . COLONOSCOPY  02/24/2010  . cystoscopy with ureteral stent  Bilateral 10/07/2017   At [place] CV LINE RIGHT  11/17/2017  . IR FLUORO GUIDE CV LINE RIGHT  11/22/2017  . IR REMOVAL TUN CV CATH W/O FL  01/26/2018  . IR US GUIDE VASC ACCESS RIGHT  11/17/2017  . MULTIPLE EXTRACTIONS WITH ALVEOLOPLASTY N/A 06/13/2012   Procedure: MULTIPLE EXTRACION WITH ALVEOLOPLASTY EXTRACT: 18, 19, 20, 21, 22, 24, 25, 27, 28, 29, 30, 31;  Surgeon: Gae Bon, DDS;  Location: [place];  Service: Oral Surgery;  Laterality: N/A;  . TUBAL LIGATION    . URETERAL STENT PLACEMENT  09/2017    MEDICATIONS: No current facility-administered medications for this encounter.   Marland Kitchen acetaminophen (TYLENOL) 325 MG tablet  . albuterol (PROAIR HFA) 108 (90 Base) MCG/ACT inhaler  . calcitRIOL (ROCALTROL) 0.25 MCG capsule  . carboxymethylcellulose (ARTIFICIAL TEARS) 1 % ophthalmic solution  . carvedilol (COREG) 6.25 MG tablet  . cefpodoxime (VANTIN) 200 MG tablet  . cinacalcet (SENSIPAR) 30 MG tablet  . dextrose (GLUTOSE) 40 % GEL  . Emollient (CETAPHIL) cream  . ferric citrate (AURYXIA) 1 GM 210 MG(Fe) tablet  . folic acid (FOLVITE) 1 MG tablet  . insulin lispro (HUMALOG) 100 UNIT/ML injection  . levETIRAcetam (KEPPRA) 1000 MG tablet  . loperamide (IMODIUM) 2 MG capsule  . LORazepam (ATIVAN) 2 MG/ML concentrated solution  . Melatonin 3 MG CAPS  . ondansetron (ZOFRAN ODT) 4 MG disintegrating tablet  . oxycodone (OXY-IR) 5 MG capsule  . phenytoin (DILANTIN INFATABS) 50 MG tablet  .  senna (SENOKOT) 8.6 MG TABS tablet  . sodium bicarbonate 650 MG tablet  . topiramate (TOPAMAX) 25 MG tablet  . Vitamin D, Ergocalciferol, (DRISDOL) 1.25 MG (50000 UT) CAPS capsule    Myra Gianotti, PA-C Surgical Short Stay/Anesthesiology [facility] Phone [phone] [facility] Phone [phone] 04/19/2019 1:48 PM

## 2019-04-20 ENCOUNTER — Ambulatory Visit (HOSPITAL_COMMUNITY): Payer: Medicaid Other | Admitting: Vascular Surgery

## 2019-04-20 ENCOUNTER — Encounter (HOSPITAL_COMMUNITY): Payer: Self-pay | Admitting: Orthopedic Surgery

## 2019-04-20 ENCOUNTER — Ambulatory Visit (HOSPITAL_COMMUNITY)
Admission: RE | Admit: 2019-04-20 | Discharge: 2019-04-21 | Disposition: A | Discharge status: Skilled Nursing Facility | Payer: Medicaid Other | Attending: Orthopedic Surgery | Admitting: Orthopedic Surgery

## 2019-04-20 ENCOUNTER — Ambulatory Visit (HOSPITAL_COMMUNITY): Payer: Medicaid Other

## 2019-04-20 ENCOUNTER — Encounter (HOSPITAL_COMMUNITY)
Admission: RE | Disposition: A | Discharge status: Skilled Nursing Facility | Payer: Self-pay | Source: Home / Self Care | Attending: Orthopedic Surgery

## 2019-04-20 ENCOUNTER — Other Ambulatory Visit: Payer: Self-pay

## 2019-04-20 DIAGNOSIS — Z992 Dependence on renal dialysis: Secondary | ICD-10-CM | POA: Insufficient documentation

## 2019-04-20 DIAGNOSIS — E1122 Type 2 diabetes mellitus with diabetic chronic kidney disease: Secondary | ICD-10-CM | POA: Insufficient documentation

## 2019-04-20 DIAGNOSIS — E1143 Type 2 diabetes mellitus with diabetic autonomic (poly)neuropathy: Secondary | ICD-10-CM | POA: Diagnosis not present

## 2019-04-20 DIAGNOSIS — N186 End stage renal disease: Secondary | ICD-10-CM | POA: Diagnosis not present

## 2019-04-20 DIAGNOSIS — K3184 Gastroparesis: Secondary | ICD-10-CM | POA: Diagnosis not present

## 2019-04-20 DIAGNOSIS — Z79899 Other long term (current) drug therapy: Secondary | ICD-10-CM | POA: Insufficient documentation

## 2019-04-20 DIAGNOSIS — I5032 Chronic diastolic (congestive) heart failure: Secondary | ICD-10-CM | POA: Diagnosis not present

## 2019-04-20 DIAGNOSIS — S42201D Unspecified fracture of upper end of right humerus, subsequent encounter for fracture with routine healing: Secondary | ICD-10-CM

## 2019-04-20 DIAGNOSIS — Z886 Allergy status to analgesic agent status: Secondary | ICD-10-CM | POA: Diagnosis not present

## 2019-04-20 DIAGNOSIS — I5030 Unspecified diastolic (congestive) heart failure: Secondary | ICD-10-CM | POA: Insufficient documentation

## 2019-04-20 DIAGNOSIS — G40909 Epilepsy, unspecified, not intractable, without status epilepticus: Secondary | ICD-10-CM

## 2019-04-20 DIAGNOSIS — E119 Type 2 diabetes mellitus without complications: Secondary | ICD-10-CM | POA: Diagnosis not present

## 2019-04-20 DIAGNOSIS — K219 Gastro-esophageal reflux disease without esophagitis: Secondary | ICD-10-CM | POA: Insufficient documentation

## 2019-04-20 DIAGNOSIS — W19XXXA Unspecified fall, initial encounter: Secondary | ICD-10-CM | POA: Insufficient documentation

## 2019-04-20 DIAGNOSIS — Z794 Long term (current) use of insulin: Secondary | ICD-10-CM | POA: Diagnosis not present

## 2019-04-20 DIAGNOSIS — Z20822 Contact with and (suspected) exposure to covid-19: Secondary | ICD-10-CM | POA: Insufficient documentation

## 2019-04-20 DIAGNOSIS — I132 Hypertensive heart and chronic kidney disease with heart failure and with stage 5 chronic kidney disease, or end stage renal disease: Secondary | ICD-10-CM | POA: Diagnosis not present

## 2019-04-20 DIAGNOSIS — Z419 Encounter for procedure for purposes other than remedying health state, unspecified: Secondary | ICD-10-CM

## 2019-04-20 DIAGNOSIS — S42201A Unspecified fracture of upper end of right humerus, initial encounter for closed fracture: Secondary | ICD-10-CM | POA: Diagnosis present

## 2019-04-20 DIAGNOSIS — E1140 Type 2 diabetes mellitus with diabetic neuropathy, unspecified: Secondary | ICD-10-CM | POA: Insufficient documentation

## 2019-04-20 HISTORY — PX: ORIF HUMERUS FRACTURE: SHX2126

## 2019-04-20 LAB — POCT I-STAT, CHEM 8
BUN: 32 mg/dL — ABNORMAL HIGH (ref 8–23)
Calcium, Ion: 1.12 mmol/L — ABNORMAL LOW (ref 1.15–1.40)
Chloride: 93 mmol/L — ABNORMAL LOW (ref 98–111)
Creatinine, Ser: 4.1 mg/dL — ABNORMAL HIGH (ref 0.44–1.00)
Glucose, Bld: 126 mg/dL — ABNORMAL HIGH (ref 70–99)
HCT: 30 % — ABNORMAL LOW (ref 36.0–46.0)
Hemoglobin: 10.2 g/dL — ABNORMAL LOW (ref 12.0–15.0)
Potassium: 4 mmol/L (ref 3.5–5.1)
Sodium: 131 mmol/L — ABNORMAL LOW (ref 135–145)
TCO2: 26 mmol/L (ref 22–32)

## 2019-04-20 LAB — GLUCOSE, CAPILLARY
Glucose-Capillary: 131 mg/dL — ABNORMAL HIGH (ref 70–99)
Glucose-Capillary: 129 mg/dL — ABNORMAL HIGH (ref 70–99)
Glucose-Capillary: 230 mg/dL — ABNORMAL HIGH (ref 70–99)
Glucose-Capillary: 261 mg/dL — ABNORMAL HIGH (ref 70–99)

## 2019-04-20 LAB — HEMOGLOBIN A1C
Hgb A1c MFr Bld: 5.6 % (ref 4.8–5.6)
Mean Plasma Glucose: 114.02 mg/dL

## 2019-04-20 SURGERY — OPEN REDUCTION INTERNAL FIXATION (ORIF) PROXIMAL HUMERUS FRACTURE
Anesthesia: Regional | Laterality: Right

## 2019-04-20 MED ORDER — HYDROCODONE-ACETAMINOPHEN 10-325 MG PO TABS: 1.0000 | ORAL_TABLET | Freq: Four times a day (QID) | ORAL | 0 refills | Status: DC | PRN

## 2019-04-20 MED ORDER — OXYCODONE HCL 5 MG PO TABS: 5.0000 mg | ORAL_TABLET | Freq: Once | ORAL | Status: DC | PRN

## 2019-04-20 MED ORDER — SODIUM CHLORIDE 0.9 % IV SOLN: 100.0000 mL | INTRAVENOUS | Status: DC | PRN

## 2019-04-20 MED ORDER — SODIUM CHLORIDE 0.9 % IR SOLN
Status: DC | PRN
  Administered 2019-04-20: 1000 mL

## 2019-04-20 MED ORDER — METOCLOPRAMIDE HCL 5 MG PO TABS: 5.0000 mg | ORAL_TABLET | Freq: Three times a day (TID) | ORAL | Status: DC | PRN

## 2019-04-20 MED ORDER — ONDANSETRON HCL 4 MG PO TABS: 4.0000 mg | ORAL_TABLET | Freq: Four times a day (QID) | ORAL | Status: DC | PRN

## 2019-04-20 MED ORDER — MENTHOL 3 MG MT LOZG: 1.0000 | LOZENGE | OROMUCOSAL | Status: DC | PRN

## 2019-04-20 MED ORDER — ACETAMINOPHEN 500 MG PO TABS
500.0000 mg | ORAL_TABLET | Freq: Four times a day (QID) | ORAL | Status: AC
  Administered 2019-04-20 – 2019-04-21 (×4): 500 mg via ORAL
  Filled 2019-04-20 (×4): qty 1

## 2019-04-20 MED ORDER — FENTANYL CITRATE (PF) 250 MCG/5ML IJ SOLN
INTRAMUSCULAR | Status: AC
  Filled 2019-04-20: qty 5

## 2019-04-20 MED ORDER — CHLORHEXIDINE GLUCONATE CLOTH 2 % EX PADS
6.0000 | MEDICATED_PAD | Freq: Every day | CUTANEOUS | Status: DC
  Administered 2019-04-21: 6 via TOPICAL

## 2019-04-20 MED ORDER — HYDROCODONE-ACETAMINOPHEN 5-325 MG PO TABS
1.0000 | ORAL_TABLET | ORAL | Status: DC | PRN
  Administered 2019-04-20: 1 via ORAL
  Filled 2019-04-20: qty 1

## 2019-04-20 MED ORDER — ALUM & MAG HYDROXIDE-SIMETH 200-200-20 MG/5ML PO SUSP: 30.0000 mL | ORAL | Status: DC | PRN

## 2019-04-20 MED ORDER — METHOCARBAMOL 1000 MG/10ML IJ SOLN
500.0000 mg | Freq: Four times a day (QID) | INTRAVENOUS | Status: DC | PRN
  Filled 2019-04-20: qty 5

## 2019-04-20 MED ORDER — FENTANYL CITRATE (PF) 100 MCG/2ML IJ SOLN
INTRAMUSCULAR | Status: AC
  Administered 2019-04-20: 50 ug via INTRAVENOUS
  Filled 2019-04-20: qty 2

## 2019-04-20 MED ORDER — ONDANSETRON HCL 4 MG/2ML IJ SOLN
INTRAMUSCULAR | Status: AC
  Filled 2019-04-20: qty 2

## 2019-04-20 MED ORDER — SODIUM BICARBONATE 650 MG PO TABS
650.0000 mg | ORAL_TABLET | Freq: Four times a day (QID) | ORAL | Status: DC
  Administered 2019-04-20 – 2019-04-21 (×4): 650 mg via ORAL
  Filled 2019-04-20 (×4): qty 1

## 2019-04-20 MED ORDER — PROPOFOL 10 MG/ML IV BOLUS
INTRAVENOUS | Status: AC
  Filled 2019-04-20: qty 20

## 2019-04-20 MED ORDER — SUGAMMADEX SODIUM 200 MG/2ML IV SOLN
INTRAVENOUS | Status: DC | PRN
  Administered 2019-04-20: 100 mg via INTRAVENOUS

## 2019-04-20 MED ORDER — ROCURONIUM BROMIDE 10 MG/ML (PF) SYRINGE
PREFILLED_SYRINGE | INTRAVENOUS | Status: DC | PRN
  Administered 2019-04-20: 40 mg via INTRAVENOUS

## 2019-04-20 MED ORDER — ZOLPIDEM TARTRATE 5 MG PO TABS: 5.0000 mg | ORAL_TABLET | Freq: Every evening | ORAL | Status: DC | PRN

## 2019-04-20 MED ORDER — LIDOCAINE 2% (20 MG/ML) 5 ML SYRINGE
INTRAMUSCULAR | Status: AC
  Filled 2019-04-20: qty 5

## 2019-04-20 MED ORDER — ACETAMINOPHEN 325 MG PO TABS: 325.0000 mg | ORAL_TABLET | Freq: Four times a day (QID) | ORAL | Status: DC | PRN

## 2019-04-20 MED ORDER — DIPHENHYDRAMINE HCL 12.5 MG/5ML PO ELIX: 12.5000 mg | ORAL_SOLUTION | ORAL | Status: DC | PRN

## 2019-04-20 MED ORDER — LIDOCAINE HCL (PF) 1 % IJ SOLN: 5.0000 mL | INTRAMUSCULAR | Status: DC | PRN

## 2019-04-20 MED ORDER — POTASSIUM CHLORIDE IN NACL 20-0.45 MEQ/L-% IV SOLN
INTRAVENOUS | Status: DC
  Filled 2019-04-20: qty 1000

## 2019-04-20 MED ORDER — PHENOL 1.4 % MT LIQD: 1.0000 | OROMUCOSAL | Status: DC | PRN

## 2019-04-20 MED ORDER — ONDANSETRON HCL 4 MG/2ML IJ SOLN: 4.0000 mg | Freq: Four times a day (QID) | INTRAMUSCULAR | Status: DC | PRN

## 2019-04-20 MED ORDER — LEVETIRACETAM 500 MG PO TABS
1000.0000 mg | ORAL_TABLET | Freq: Every morning | ORAL | Status: DC
  Administered 2019-04-20 – 2019-04-21 (×2): 1000 mg via ORAL
  Filled 2019-04-20 (×2): qty 2

## 2019-04-20 MED ORDER — ONDANSETRON HCL 4 MG/2ML IJ SOLN: 4.0000 mg | Freq: Once | INTRAMUSCULAR | Status: DC | PRN

## 2019-04-20 MED ORDER — DEXAMETHASONE SODIUM PHOSPHATE 10 MG/ML IJ SOLN
INTRAMUSCULAR | Status: AC
  Filled 2019-04-20: qty 1

## 2019-04-20 MED ORDER — INSULIN ASPART 100 UNIT/ML ~~LOC~~ SOLN: 0.0000 [IU] | Freq: Three times a day (TID) | SUBCUTANEOUS | Status: DC

## 2019-04-20 MED ORDER — MAGNESIUM CITRATE PO SOLN: 1.0000 | Freq: Once | ORAL | Status: DC | PRN

## 2019-04-20 MED ORDER — FENTANYL CITRATE (PF) 100 MCG/2ML IJ SOLN: 25.0000 ug | INTRAMUSCULAR | Status: DC | PRN

## 2019-04-20 MED ORDER — BUPIVACAINE HCL (PF) 0.5 % IJ SOLN
INTRAMUSCULAR | Status: DC | PRN
  Administered 2019-04-20: 15 mL via PERINEURAL

## 2019-04-20 MED ORDER — LORAZEPAM 1 MG PO TABS: 2.0000 mg | ORAL_TABLET | Freq: Four times a day (QID) | ORAL | Status: DC | PRN

## 2019-04-20 MED ORDER — MORPHINE SULFATE (PF) 2 MG/ML IV SOLN: 0.5000 mg | INTRAVENOUS | Status: DC | PRN

## 2019-04-20 MED ORDER — ONDANSETRON HCL 4 MG/2ML IJ SOLN
INTRAMUSCULAR | Status: DC | PRN
  Administered 2019-04-20: 4 mg via INTRAVENOUS

## 2019-04-20 MED ORDER — LIDOCAINE-PRILOCAINE 2.5-2.5 % EX CREA
1.0000 "application " | TOPICAL_CREAM | CUTANEOUS | Status: DC | PRN
  Filled 2019-04-20: qty 5

## 2019-04-20 MED ORDER — HYPROMELLOSE (GONIOSCOPIC) 2.5 % OP SOLN: 1.0000 [drp] | Freq: Every day | OPHTHALMIC | Status: DC | PRN

## 2019-04-20 MED ORDER — PHENYLEPHRINE HCL-NACL 10-0.9 MG/250ML-% IV SOLN
INTRAVENOUS | Status: DC | PRN
  Administered 2019-04-20: 35 ug/min via INTRAVENOUS

## 2019-04-20 MED ORDER — FENTANYL CITRATE (PF) 250 MCG/5ML IJ SOLN
INTRAMUSCULAR | Status: DC | PRN
  Administered 2019-04-20 (×2): 50 ug via INTRAVENOUS

## 2019-04-20 MED ORDER — MIDAZOLAM HCL 2 MG/2ML IJ SOLN: 1.0000 mg | Freq: Once | INTRAMUSCULAR | Status: AC

## 2019-04-20 MED ORDER — PROPOFOL 10 MG/ML IV BOLUS
INTRAVENOUS | Status: DC | PRN
  Administered 2019-04-20: 60 mg via INTRAVENOUS

## 2019-04-20 MED ORDER — BISACODYL 10 MG RE SUPP: 10.0000 mg | Freq: Every day | RECTAL | Status: DC | PRN

## 2019-04-20 MED ORDER — HYDROCODONE-ACETAMINOPHEN 7.5-325 MG PO TABS: 1.0000 | ORAL_TABLET | ORAL | Status: DC | PRN

## 2019-04-20 MED ORDER — CHLORHEXIDINE GLUCONATE 4 % EX LIQD: 60.0000 mL | Freq: Once | CUTANEOUS | Status: DC

## 2019-04-20 MED ORDER — ALBUTEROL SULFATE (2.5 MG/3ML) 0.083% IN NEBU: 3.0000 mL | INHALATION_SOLUTION | RESPIRATORY_TRACT | Status: DC | PRN

## 2019-04-20 MED ORDER — METOCLOPRAMIDE HCL 5 MG/ML IJ SOLN: 5.0000 mg | Freq: Three times a day (TID) | INTRAMUSCULAR | Status: DC | PRN

## 2019-04-20 MED ORDER — DEXAMETHASONE SODIUM PHOSPHATE 10 MG/ML IJ SOLN
INTRAMUSCULAR | Status: DC | PRN
  Administered 2019-04-20: 5 mg via INTRAVENOUS

## 2019-04-20 MED ORDER — METHOCARBAMOL 500 MG PO TABS: 500.0000 mg | ORAL_TABLET | Freq: Four times a day (QID) | ORAL | Status: DC | PRN

## 2019-04-20 MED ORDER — CARVEDILOL 6.25 MG PO TABS
6.2500 mg | ORAL_TABLET | Freq: Two times a day (BID) | ORAL | Status: DC
  Administered 2019-04-20: 6.25 mg via ORAL
  Filled 2019-04-20: qty 1

## 2019-04-20 MED ORDER — MIDAZOLAM HCL 2 MG/2ML IJ SOLN
INTRAMUSCULAR | Status: AC
  Administered 2019-04-20: 1 mg via INTRAVENOUS
  Filled 2019-04-20: qty 2

## 2019-04-20 MED ORDER — FERRIC CITRATE 1 GM 210 MG(FE) PO TABS
420.0000 mg | ORAL_TABLET | Freq: Three times a day (TID) | ORAL | Status: DC
  Administered 2019-04-20 – 2019-04-21 (×2): 420 mg via ORAL
  Filled 2019-04-20 (×3): qty 2

## 2019-04-20 MED ORDER — CEFAZOLIN SODIUM-DEXTROSE 1-4 GM/50ML-% IV SOLN: 1.0000 g | INTRAVENOUS | Status: DC

## 2019-04-20 MED ORDER — PENTAFLUOROPROP-TETRAFLUOROETH EX AERO: 1.0000 "application " | INHALATION_SPRAY | CUTANEOUS | Status: DC | PRN

## 2019-04-20 MED ORDER — SODIUM CHLORIDE 0.9 % IV SOLN: INTRAVENOUS | Status: DC | PRN

## 2019-04-20 MED ORDER — DOCUSATE SODIUM 100 MG PO CAPS
100.0000 mg | ORAL_CAPSULE | Freq: Two times a day (BID) | ORAL | Status: DC
  Administered 2019-04-20 – 2019-04-21 (×2): 100 mg via ORAL
  Filled 2019-04-20 (×2): qty 1

## 2019-04-20 MED ORDER — INSULIN ASPART 100 UNIT/ML ~~LOC~~ SOLN
0.0000 [IU] | Freq: Three times a day (TID) | SUBCUTANEOUS | Status: DC
  Administered 2019-04-20: 2 [IU] via SUBCUTANEOUS

## 2019-04-20 MED ORDER — POLYETHYLENE GLYCOL 3350 17 G PO PACK: 17.0000 g | PACK | Freq: Every day | ORAL | Status: DC | PRN

## 2019-04-20 MED ORDER — ACETAMINOPHEN 500 MG PO TABS
1000.0000 mg | ORAL_TABLET | Freq: Once | ORAL | Status: DC
  Filled 2019-04-20: qty 2

## 2019-04-20 MED ORDER — FOLIC ACID 1 MG PO TABS
1.0000 mg | ORAL_TABLET | Freq: Every morning | ORAL | Status: DC
  Administered 2019-04-21: 1 mg via ORAL
  Filled 2019-04-20: qty 1

## 2019-04-20 MED ORDER — FENTANYL CITRATE (PF) 100 MCG/2ML IJ SOLN: 50.0000 ug | Freq: Once | INTRAMUSCULAR | Status: AC

## 2019-04-20 MED ORDER — BUPIVACAINE LIPOSOME 1.3 % IJ SUSP
INTRAMUSCULAR | Status: DC | PRN
  Administered 2019-04-20: 10 mL

## 2019-04-20 MED ORDER — CEFAZOLIN SODIUM-DEXTROSE 2-4 GM/100ML-% IV SOLN
2.0000 g | INTRAVENOUS | Status: AC
  Administered 2019-04-20: 2 g via INTRAVENOUS
  Filled 2019-04-20: qty 100

## 2019-04-20 MED ORDER — OXYCODONE HCL 5 MG/5ML PO SOLN: 5.0000 mg | Freq: Once | ORAL | Status: DC | PRN

## 2019-04-20 MED ORDER — TOPIRAMATE 25 MG PO TABS
25.0000 mg | ORAL_TABLET | Freq: Two times a day (BID) | ORAL | Status: DC
  Administered 2019-04-20 – 2019-04-21 (×2): 25 mg via ORAL
  Filled 2019-04-20 (×2): qty 1

## 2019-04-20 MED ORDER — PHENYTOIN 50 MG PO CHEW
200.0000 mg | CHEWABLE_TABLET | Freq: Two times a day (BID) | ORAL | Status: DC
  Administered 2019-04-20 – 2019-04-21 (×2): 200 mg via ORAL
  Filled 2019-04-20 (×3): qty 4

## 2019-04-20 MED ORDER — LIDOCAINE 2% (20 MG/ML) 5 ML SYRINGE
INTRAMUSCULAR | Status: DC | PRN
  Administered 2019-04-20: 40 mg via INTRAVENOUS

## 2019-04-20 SURGICAL SUPPLY — 51 items
BIT DRILL 3.2 (BIT) ×2
BIT DRILL 3.2XCALB NS DISP (BIT) ×1 IMPLANT
BIT DRILL CALIBRATED 2.7 (BIT) ×1 IMPLANT
BIT DRL 3.2XCALB NS DISP (BIT) ×1
CLSR STERI-STRIP ANTIMIC 1/2X4 (GAUZE/BANDAGES/DRESSINGS) ×1 IMPLANT
COVER SURGICAL LIGHT HANDLE (MISCELLANEOUS) ×2 IMPLANT
DRAPE C-ARM 42X72 X-RAY (DRAPES) ×2 IMPLANT
DRAPE IMP U-DRAPE 54X76 (DRAPES) ×2 IMPLANT
DRAPE INCISE IOBAN 66X45 STRL (DRAPES) ×2 IMPLANT
DRAPE U-SHAPE 47X51 STRL (DRAPES) ×2 IMPLANT
DRSG AQUACEL AG ADV 3.5X10 (GAUZE/BANDAGES/DRESSINGS) ×2 IMPLANT
DURAPREP 26ML APPLICATOR (WOUND CARE) ×2 IMPLANT
ELECT REM PT RETURN 9FT ADLT (ELECTROSURGICAL) ×2
ELECTRODE REM PT RTRN 9FT ADLT (ELECTROSURGICAL) IMPLANT
GLOVE BIOGEL PI ORTHO PRO SZ8 (GLOVE) ×1
GLOVE ORTHO TXT STRL SZ7.5 (GLOVE) ×4 IMPLANT
GLOVE PI ORTHO PRO STRL SZ8 (GLOVE) ×1 IMPLANT
GLOVE SURG ORTHO 8.0 STRL STRW (GLOVE) ×4 IMPLANT
GOWN STRL REUS W/ TWL LRG LVL3 (GOWN DISPOSABLE) IMPLANT
GOWN STRL REUS W/TWL LRG LVL3 (GOWN DISPOSABLE)
K-WIRE 2X5 SS THRDED S3 (WIRE) ×6
KIT BASIN OR (CUSTOM PROCEDURE TRAY) ×2 IMPLANT
KIT TURNOVER KIT B (KITS) ×2 IMPLANT
KWIRE 2X5 SS THRDED S3 (WIRE) IMPLANT
MANIFOLD NEPTUNE II (INSTRUMENTS) ×2 IMPLANT
NS IRRIG 1000ML POUR BTL (IV SOLUTION) ×2 IMPLANT
PACK SHOULDER (CUSTOM PROCEDURE TRAY) ×2 IMPLANT
PEG LOCKING 3.2MMX44 (Peg) ×1 IMPLANT
PEG LOCKING 3.2X36 (Screw) ×2 IMPLANT
PEG LOCKING 3.2X40 (Peg) ×2 IMPLANT
PEG LOCKING 3.2X42 (Screw) ×2 IMPLANT
PEG LOCKING 3.2X48 (Peg) ×1 IMPLANT
PLATE PROX HUM LO R 4H 83 (Plate) ×2 IMPLANT
SCREW LOCK CORT STAR 3.5X24 (Screw) ×4 IMPLANT
SCREW LP NL T15 3.5X24 (Screw) ×2 IMPLANT
SCREW PEG LOCK 3.2X30MM (Screw) ×1 IMPLANT
SLING ARM FOAM STRAP SML (SOFTGOODS) ×1 IMPLANT
SPONGE LAP 4X18 RFD (DISPOSABLE) IMPLANT
SUCTION FRAZIER HANDLE 10FR (MISCELLANEOUS) ×2
SUCTION TUBE FRAZIER 10FR DISP (MISCELLANEOUS) ×1 IMPLANT
SUPPORT WRAP ARM LG (MISCELLANEOUS) ×2 IMPLANT
SUT FIBERWIRE #2 38 T-5 BLUE (SUTURE) ×2
SUT VIC AB 0 CT1 27 (SUTURE) ×2
SUT VIC AB 0 CT1 27XBRD ANBCTR (SUTURE) ×1 IMPLANT
SUT VIC AB 2-0 CT1 27 (SUTURE) ×2
SUT VIC AB 2-0 CT1 TAPERPNT 27 (SUTURE) ×1 IMPLANT
SUT VIC AB 3-0 FS2 27 (SUTURE) ×2 IMPLANT
SUTURE FIBERWR #2 38 T-5 BLUE (SUTURE) IMPLANT
SYR BULB IRRIGATION 50ML (SYRINGE) ×2 IMPLANT
TOWEL GREEN STERILE (TOWEL DISPOSABLE) ×2 IMPLANT
TOWEL GREEN STERILE FF (TOWEL DISPOSABLE) ×2 IMPLANT

## 2019-04-20 NOTE — Anesthesia Procedure Notes (Signed)
Anesthesia Regional Block: Interscalene brachial plexus block   Pre-Anesthetic Checklist: ,, timeout performed, Correct Patient, Correct Site, Correct Laterality, Correct Procedure, Correct Position, site marked, Risks and benefits discussed,  Surgical consent,  Pre-op evaluation,  At surgeon's request and post-op pain management  Laterality: Right  Prep: chloraprep       Needles:  Injection technique: Single-shot  Needle Type: Echogenic Stimulator Needle     Needle Length: 9cm  Needle Gauge: 21     Additional Needles:   Procedures:,,,, ultrasound used (permanent image in chart),,,,  Narrative:  Start time: 04/20/2019 7:35 AM End time: 04/20/2019 7:41 AM Injection made incrementally with aspirations every 5 mL.  Performed by: Personally  Anesthesiologist: [name], MD  Additional Notes: Monitors applied. Injection made in 5cc increments. No resistance to injection. Good needle visualization. Patient tolerated procedure well.

## 2019-04-20 NOTE — Progress Notes (Signed)
Patient received from the ER via stretcher.

## 2019-04-20 NOTE — Anesthesia Procedure Notes (Signed)
Procedure Name: Intubation Date/Time: 04/20/2019 8:32 AM Performed by: Trinna Post., CRNA Pre-anesthesia Checklist: Patient identified, Emergency Drugs available, Suction available, Patient being monitored and Timeout performed Patient Re-evaluated:Patient Re-evaluated prior to induction Oxygen Delivery Method: Circle system utilized Preoxygenation: Pre-oxygenation with 100% oxygen Induction Type: IV induction Ventilation: Mask ventilation without difficulty Laryngoscope Size: Mac and 3 Grade View: Grade I Tube type: Oral Tube size: 7.0 mm Number of attempts: 1 Airway Equipment and Method: Stylet Placement Confirmation: ETT inserted through vocal cords under direct vision,  positive ETCO2 and breath sounds checked- equal and bilateral Secured at: 22 cm Tube secured with: Tape Dental Injury: Teeth and Oropharynx as per pre-operative assessment

## 2019-04-20 NOTE — Progress Notes (Signed)
Pt did not take home BP coreg 6.25mg  this am. BP 133/44. Pt not given pre-op tylenol 1000 mg due to elevated AST/ALT. Per Dr. Christella Hartigan, okay to hold both.

## 2019-04-20 NOTE — Consult Note (Signed)
Medical Consultation   Erica [name]  [id number]  DOB: [birth year]  DOA: 04/20/2019  PCP: Scotty Court, DO   Outpatient Specialists: Sandi Mealy - orthopedics; Fields - vascular; Ree Edman - urology    Requesting physician: Orthopedics PA  Reason for consultation: Medical management - Humerus fracture s/p ORIF.  Lethargic post-operatively.  Has ESRD, needs HD tomorrow.  History of Present Illness: Erica [name] is an 64 y.o. female  with medical history significant of h/o alcohol-induced pancreatitis; seizures; HTN; HLD; DM: ESRD on HD; and diastolic CHF presenting with R proximal humerus fracture that occurred from a fall on 2/20.  She was scheduled for elective repair today.  She reports that she was walking with a walker at her facility and she let go and fell and fell on 2/20.  They put a splint on it and she was sent to ortho as an outpatient.  She had the surgery today and is feeling pretty good.  Her hand is still numb.  She was due for HD today.  She is not feeling particularly SOB at this time.    Review of Systems:  ROS As per HPI otherwise 10 point review of systems negative.    Past Medical History: Past Medical History:  Diagnosis Date  . Alcohol-induced pancreatitis   . Chronic diarrhea   . Depression   . Diabetes mellitus    fasting blood sugar 110-120s  . Diastolic CHF ([place])   . DKA (diabetic ketoacidoses) ([place])   . Gastroparesis   . GERD (gastroesophageal reflux disease)   . Heart murmur   . History of kidney stones   . Hyperlipidemia   . Hypertension   . Hypokalemia   . Muscle spasm   . Neuropathic pain   . Neuropathy    Hx: of  . Pyelonephritis   . Seizures ([place])   . Vitamin B12 deficiency   . Vitamin D deficiency     Past Surgical History: Past Surgical History:  Procedure Laterality Date  . AV FISTULA PLACEMENT Left 12/15/2017   Procedure: INSERTION OF ARTERIOVENOUS (AV) GORE-TEX GRAFT ARM;  Surgeon: Rosetta Posner,  MD;  Location: [place];  Service: Vascular;  Laterality: Left;  . CATARACT EXTRACTION W/PHACO Right 03/14/2015   Procedure: CATARACT EXTRACTION PHACO AND INTRAOCULAR LENS PLACEMENT (IOC);  Surgeon: Baruch Goldmann, MD;  Location: [facility];  Service: Ophthalmology;  Laterality: Right;  CDE:11.13  . CATARACT EXTRACTION W/PHACO Left 04/11/2015   Procedure: CATARACT EXTRACTION PHACO AND INTRAOCULAR LENS PLACEMENT LEFT EYE CDE=9.68;  Surgeon: Baruch Goldmann, MD;  Location: [facility];  Service: Ophthalmology;  Laterality: Left;  . COLONOSCOPY  02/24/2010  . cystoscopy with ureteral stent  Bilateral 10/07/2017   At [place] CV LINE RIGHT  11/17/2017  . IR FLUORO GUIDE CV LINE RIGHT  11/22/2017  . IR REMOVAL TUN CV CATH W/O FL  01/26/2018  . IR US GUIDE VASC ACCESS RIGHT  11/17/2017  . MULTIPLE EXTRACTIONS WITH ALVEOLOPLASTY N/A 06/13/2012   Procedure: MULTIPLE EXTRACION WITH ALVEOLOPLASTY EXTRACT: 18, 19, 20, 21, 22, 24, 25, 27, 28, 29, 30, 31;  Surgeon: Gae Bon, DDS;  Location: [place];  Service: Oral Surgery;  Laterality: N/A;  . TUBAL LIGATION    . URETERAL STENT PLACEMENT  09/2017     Allergies:   Allergies  Allergen Reactions  . Aspirin Palpitations    Listed on [facility]  11/12/18     Social History:  reports that she has never smoked. Her smokeless tobacco use includes snuff. She reports previous alcohol use of about 1.0 standard drinks of alcohol per week. She reports that she does not use drugs.   Family History: Family History  Problem Relation Age of Onset  . Diabetes Sister   . Chronic Renal Failure Neg Hx       Physical Exam: Vitals:   04/20/19 1111 04/20/19 1126 04/20/19 1239 04/20/19 1623  BP: (!) 192/39 (!) 120/42 (!) 129/37 (!) 110/41  Pulse: 73 71 72 79  Resp: 16 16 17 17   Temp: (!) 97 F (36.1 C)  97.8 F (36.6 C) 98.2 F (36.8 C)  TempSrc:   Oral Oral  SpO2: 98% 97% 95% 99%  Weight:      Height:        Constitutional: Alert and awake,  oriented x3, not in any acute distress. Eyes: EOMI, irises appear normal, anicteric sclera,  ENMT: external ears and nose appear normal, normal hearing, Lips appear normal, oropharynx mucosa, tongue, edentulous Neck: neck appears normal, no masses, normal ROM, no thyromegaly, no JVD  CVS: S1-S2 clear, no murmur rubs or gallops, no LE edema, normal pedal pulses  Respiratory:  clear to auscultation bilaterally, no wheezing, rales or rhonchi. Respiratory effort normal. No accessory muscle use.  Abdomen: soft nontender, nondistended Musculoskeletal: : no cyanosis, clubbing or edema noted bilaterally in LE; RUE is in a sling with surgical dressing along anterior shoulder Neuro: Cranial nerves II-XII grossly intact Psych: judgement and insight appear normal, stable mood and affect, mental status Skin: no rashes or lesions or ulcers, no induration or nodules    Data reviewed:  I have personally reviewed the recent labs and imaging studies  Pertinent Labs:   Na++ 131 Glucose 126 BUN 32/Creatinine 4.10 A1c 5.6 COVID pending   Inpatient Medications:   Scheduled Meds: . acetaminophen  500 mg Oral Q6H  . carvedilol  6.25 mg Oral BID  . docusate sodium  100 mg Oral BID  . ferric citrate  420 mg Oral TID WC  . folic acid  1 mg Oral q morning - 10a  . insulin aspart  0-15 Units Subcutaneous TID WC  . levETIRAcetam  1,000 mg Oral q morning - 10a  . phenytoin  200 mg Oral BID  . sodium bicarbonate  650 mg Oral QID  . topiramate  25 mg Oral BID   Continuous Infusions: . 0.45 % NaCl with KCl 20 mEq / L 50 mL/hr at 04/20/19 1318  . [START ON 04/21/2019]  ceFAZolin (ANCEF) IV    . methocarbamol (ROBAXIN) IV       Radiological Exams on Admission: DG Humerus Right  Result Date: 04/20/2019 CLINICAL DATA:  surgery EXAM: DG C-ARM 1-60 MIN; RIGHT HUMERUS - 2+ VIEW CONTRAST:  None. FLUOROSCOPY TIME:  Fluoroscopy Time:  2 minutes 42 seconds Radiation Exposure Index (if provided by the fluoroscopic  device): 11 point 8 mGy COMPARISON:  04/08/2019. FINDINGS: Plate and screw fixation of proximal right humerus. Hardware intact. Anatomic alignment. IMPRESSION: Postsurgical changes right humerus. Electronically Signed   By: Marcello Moores  Register   On: 04/20/2019 11:11   DG C-Arm 1-60 Min  Result Date: 04/20/2019 CLINICAL DATA:  surgery EXAM: DG C-ARM 1-60 MIN; RIGHT HUMERUS - 2+ VIEW CONTRAST:  None. FLUOROSCOPY TIME:  Fluoroscopy Time:  2 minutes 42 seconds Radiation Exposure Index (if provided by the fluoroscopic device): 11 point 8 mGy COMPARISON:  04/08/2019.  FINDINGS: Plate and screw fixation of proximal right humerus. Hardware intact. Anatomic alignment. IMPRESSION: Postsurgical changes right humerus. Electronically Signed   By: Marcello Moores  Register   On: 04/20/2019 11:11    Impression/Recommendations Principal Problem:   Closed fracture of right proximal humerus Active Problems:   Seizure disorder (HCC)   Chronic diastolic congestive heart failure (HCC)   Diabetes mellitus type 2 in nonobese (HCC)   ESRD (end stage renal disease) ([place])   R humerus fracture -Patient from SNF with a fall resulting in R humerus fracture on 2/20 -She was placed on a long-arm cast and had outpatient f/u, but it did not heal well enough and she had ORIF today -She was mildly somnolent post-operatively and so will be monitored overnight  Seizure d/o -Patient with known seizure disorder -She takes Dilantin, Topamax, and Keppra and these have been continued  ESRD on TTS HD -She missed HD today due to surgery -Nephrology prn order set utilized -Nephrology been notified that the patient will require HD tonight or tomorrow, but appears to not need urgent HD at this time -Continue Bicarb, Calcitriol, Sensipar, Auryxia  HTN -Continue Coreg  HLD -Appears to not be taking Lipitor at this time  DM -She appears to no longer be on oral meds or basal insulin with A1c 5.6 -Will cover with very sensitive-scale SSI  for now  Chronic diastolic CHF -Volume management with HD   Thank you for this consultation.  Our [facility] hospitalist team will sign off at this time.  Please reconsult if additional issues arise.   Time Spent: 36 minutes  Karmen Bongo M.D. [facility] 04/20/2019, 4:49 PM

## 2019-04-20 NOTE — Progress Notes (Signed)
Orthopedic Tech Progress Note Patient Details:  Erica [name] 1955-12-21 [id number] Patient unable to use overhead frame. Patient ID: [name], female   DOB: 14-Dec-1955, 65 y.o.   MRN: [id number]   [name] 04/20/2019, 12:54 PM

## 2019-04-20 NOTE — Progress Notes (Signed)
Pre-discharge COVID test ordered.

## 2019-04-20 NOTE — Progress Notes (Signed)
Renal Navigator left message for Admissions Director/Shannon at [facility] to evaluate the possibility for patient to be picked up from OP HD clinic tomorrow after treatment. Renal Navigator received call back from CSW/S. Wicker who will also attempt to reach her. CSW to request updated COVID test to be done in order for patient to return to SNF.  Renal Navigator will follow closely to see if tomorrow's HD treatment can be done at OP HD clinic, which would be ideal.  Alphonzo Cruise, [place] Renal Navigator [phone]

## 2019-04-20 NOTE — Discharge Instructions (Signed)
Diet: As you were doing prior to hospitalization   Shower:  May shower but keep the wounds dry, use an occlusive plastic wrap, NO SOAKING IN TUB.  If the bandage gets wet, change with a clean dry gauze.  If you have a splint on, leave the splint in place and keep the splint dry with a plastic bag.  Dressing:  You may change your dressing 3-5 days after surgery, unless you have a splint.  If you have a splint, then just leave the splint in place and we will change your bandages during your first follow-up appointment.    If you had hand or foot surgery, we will plan to remove your stitches in about 2 weeks in the office.  For all other surgeries, there are sticky tapes (steri-strips) on your wounds and all the stitches are absorbable.  Leave the steri-strips in place when changing your dressings, they will peel off with time, usually 2-3 weeks.  Activity:  Increase activity slowly as tolerated, but follow the weight bearing instructions below.  The rules on driving is that you can not be taking narcotics while you drive, and you must feel in control of the vehicle.    Weight Bearing:   Non-weight bearing, right shoulder. Continue to use sling until follow-up visit with Dr. Mardelle Matte  To prevent constipation: you may use a stool softener such as -  Colace (over the counter) 100 mg by mouth twice a day  Drink plenty of fluids (prune juice may be helpful) and high fiber foods Miralax (over the counter) for constipation as needed.    Itching:  If you experience itching with your medications, try taking only a single pain pill, or even half a pain pill at a time.  You may take up to 10 pain pills per day, and you can also use benadryl over the counter for itching or also to help with sleep.   Precautions:  If you experience chest pain or shortness of breath - call 911 immediately for transfer to the hospital emergency department!!  If you develop a fever greater that 101 F, purulent drainage from wound,  increased redness or drainage from wound, or calf pain -- Call the office at [phone]                                                Follow- Up Appointment:  Please call for an appointment to be seen in 2 weeks [place] - [phone]

## 2019-04-20 NOTE — Progress Notes (Signed)
Patient was admitted from PACU due to lethargy. We'll plan to keep her overnight for observation. Patient has history of stroke in 2019, seizures, DM Type II, ESRD with Tue/Thurs/Sat dialysis schedule. Hemodialysis coordinator was contacted regarding dialysis scheduling.   Patient examined in room. Dressings intact. Patient seated in bed eating lunch. AA x 2. Plan to discharge back to [facility] tomorrow if no acute events overnight.

## 2019-04-20 NOTE — Transfer of Care (Signed)
Immediate Anesthesia Transfer of Care Note  Patient: Erica [name]  Procedure(s) Performed: OPEN REDUCTION INTERNAL FIXATION (ORIF) PROXIMAL HUMERUS FRACTURE (Right )  Patient Location: PACU  Anesthesia Type:General and Regional  Level of Consciousness: awake, alert  and oriented  Airway & Oxygen Therapy: Patient Spontanous Breathing and Patient connected to face mask oxygen  Post-op Assessment: Report given to RN and Post -op Vital signs reviewed and stable  Post vital signs: Reviewed and stable  Last Vitals:  Vitals Value Taken Time  BP 187/48 04/20/19 1112  Temp    Pulse 72 04/20/19 1114  Resp 18 04/20/19 1114  SpO2 95 % 04/20/19 1114  Vitals shown include unvalidated device data.  Last Pain:  Vitals:   04/20/19 1111  TempSrc:   PainSc: (P) Asleep      Patients Stated Pain Goal: 3 (46/50/35 4656)  Complications: No apparent anesthesia complications

## 2019-04-20 NOTE — Clinical Social Work Note (Signed)
Plan is to discharge back to [facility]. CSW left message for [facility] admissions coordinator. Waiting on return call.   COVID has requested. Will need to be dialyzed tomorrow either inpatient or at her outpatient clinic. Will need to work out specific details with renal navigator and admission coordinator.

## 2019-04-20 NOTE — Progress Notes (Signed)
Orthopedic Tech Progress Note Patient Details:  Erica [name] [birth year] [id number]  Ortho Devices Type of Ortho Device: Sling immobilizer Ortho Device/Splint Location: LUE Ortho Device/Splint Interventions: Ordered, Application   Post Interventions Patient Tolerated: Well Instructions Provided: Care of device, Adjustment of device   Janit Pagan 04/20/2019, 2:15 PM

## 2019-04-20 NOTE — Anesthesia Postprocedure Evaluation (Signed)
Anesthesia Post Note  Patient: Erica [name]  Procedure(s) Performed: OPEN REDUCTION INTERNAL FIXATION (ORIF) PROXIMAL HUMERUS FRACTURE (Right )     Patient location during evaluation: PACU Anesthesia Type: Regional and General Level of consciousness: awake and alert Pain management: pain level controlled Vital Signs Assessment: post-procedure vital signs reviewed and stable Respiratory status: spontaneous breathing, nonlabored ventilation and respiratory function stable Cardiovascular status: blood pressure returned to baseline and stable Postop Assessment: no apparent nausea or vomiting Anesthetic complications: no    Last Vitals:  Vitals:   04/20/19 1126 04/20/19 1239  BP: (!) 120/42 (!) 129/37  Pulse: 71 72  Resp: 16 17  Temp:  36.6 C  SpO2: 97% 95%    Last Pain:  Vitals:   04/20/19 1239  TempSrc: Oral  PainSc:                  [name]

## 2019-04-20 NOTE — Plan of Care (Signed)

## 2019-04-20 NOTE — H&P (Signed)
PREOPERATIVE H&P  Chief Complaint: Right shoulder pain  HPI: Erica [name] is a 64 y.o. female who presents for preoperative history and physical with a diagnosis of right proximal humerus fracture. Symptoms are rated as moderate to severe, and have been worsening.  This is significantly impairing activities of daily living.  She has elected for surgical management.   Injury occurred February 20 after a fall.  Obvious deformity.  Attempted to use a hanging arm cast, but still with significant deformity.  She is elected for surgical management.  Past Medical History:  Diagnosis Date  . Alcohol-induced pancreatitis   . Chronic diarrhea   . Depression   . Diabetes mellitus    fasting blood sugar 110-120s  . Diastolic CHF ([place])   . DKA (diabetic ketoacidoses) ([place])   . Gastroparesis   . GERD (gastroesophageal reflux disease)   . Heart murmur   . History of kidney stones   . Hyperlipidemia   . Hypertension   . Hypokalemia   . Muscle spasm   . Neuropathic pain   . Neuropathy    Hx: of  . Pyelonephritis   . Seizures ([place])   . Vitamin B12 deficiency   . Vitamin D deficiency    Past Surgical History:  Procedure Laterality Date  . AV FISTULA PLACEMENT Left 12/15/2017   Procedure: INSERTION OF ARTERIOVENOUS (AV) GORE-TEX GRAFT ARM;  Surgeon: Rosetta Posner, MD;  Location: [place];  Service: Vascular;  Laterality: Left;  . CATARACT EXTRACTION W/PHACO Right 03/14/2015   Procedure: CATARACT EXTRACTION PHACO AND INTRAOCULAR LENS PLACEMENT (IOC);  Surgeon: Baruch Goldmann, MD;  Location: [facility];  Service: Ophthalmology;  Laterality: Right;  CDE:11.13  . CATARACT EXTRACTION W/PHACO Left 04/11/2015   Procedure: CATARACT EXTRACTION PHACO AND INTRAOCULAR LENS PLACEMENT LEFT EYE CDE=9.68;  Surgeon: Baruch Goldmann, MD;  Location[facility];  Service: Ophthalmology;  Laterality: Left;  . COLONOSCOPY  02/24/2010  . cystoscopy with ureteral stent  Bilateral 10/07/2017   At [place] CV LINE RIGHT  11/17/2017  . IR FLUORO GUIDE CV LINE RIGHT  11/22/2017  . IR REMOVAL TUN CV CATH W/O FL  01/26/2018  . IR US GUIDE VASC ACCESS RIGHT  11/17/2017  . MULTIPLE EXTRACTIONS WITH ALVEOLOPLASTY N/A 06/13/2012   Procedure: MULTIPLE EXTRACION WITH ALVEOLOPLASTY EXTRACT: 18, 19, 20, 21, 22, 24, 25, 27, 28, 29, 30, 31;  Surgeon: Gae Bon, DDS;  Location: [place];  Service: Oral Surgery;  Laterality: N/A;  . TUBAL LIGATION    . URETERAL STENT PLACEMENT  09/2017   Social History   Socioeconomic History  . Marital status: Legally Separated    Spouse name: Not on file  . Number of children: Not on file  . Years of education: Not on file  . Highest education level: Not on file  Occupational History  . Not on file  Tobacco Use  . Smoking status: Never Smoker  . Smokeless tobacco: Current User    Types: Snuff  Substance and Sexual Activity  . Alcohol use: Not Currently    Alcohol/week: 1.0 standard drinks    Types: 1 Cans of beer per week  . Drug use: No  . Sexual activity: Not Currently  Other Topics Concern  . Not on file  Social History Narrative  . Not on file   Social Determinants of Health   Financial Resource Strain:   . Difficulty of Paying Living Expenses: Not on file  Food Insecurity:   .  Worried About Charity fundraiser in the Last Year: Not on file  . Ran Out of Food in the Last Year: Not on file  Transportation Needs:   . Lack of Transportation (Medical): Not on file  . Lack of Transportation (Non-Medical): Not on file  Physical Activity:   . Days of Exercise per Week: Not on file  . Minutes of Exercise per Session: Not on file  Stress:   . Feeling of Stress : Not on file  Social Connections:   . Frequency of Communication with Friends and Family: Not on file  . Frequency of Social Gatherings with Friends and Family: Not on file  . Attends Religious Services: Not on file  . Active Member of Clubs or Organizations: Not on file  . Attends  Archivist Meetings: Not on file  . Marital Status: Not on file   Family History  Problem Relation Age of Onset  . Diabetes Sister   . Chronic Renal Failure Neg Hx    Allergies  Allergen Reactions  . Aspirin Palpitations    Listed on [facility] 11/12/18   Prior to Admission medications   Medication Sig Start Date End Date Taking? Authorizing Provider  calcitRIOL (ROCALTROL) 0.25 MCG capsule Take 1 capsule (0.25 mcg total) by mouth Every Tuesday,Thursday,and Saturday with dialysis. 12/18/17  Yes Love, Ivan Anchors, PA-C  carvedilol (COREG) 6.25 MG tablet Take 1 tablet (6.25 mg total) by mouth 2 (two) times daily. 11/15/18  Yes Buriev, Arie Sabina, MD  cefpodoxime (VANTIN) 200 MG tablet Take 200 mg by mouth See admin instructions. Take 200 mg once a day on Tue, Thurs, and Sat for UTI   Yes [provider]  cinacalcet (SENSIPAR) 30 MG tablet Take 30 mg by mouth every evening.   Yes [provider]  ferric citrate (AURYXIA) 1 GM 210 MG(Fe) tablet Take 2 tablets (420 mg total) by mouth 3 (three) times daily with meals. 11/15/18  Yes Buriev, Arie Sabina, MD  folic acid (FOLVITE) 1 MG tablet Take 1 mg by mouth every morning.   Yes [provider]  insulin lispro (HUMALOG) 100 UNIT/ML injection Inject 2-10 Units into the skin 4 (four) times daily. Per sliding scale: CBG 151-200 2 units, 201-250 4 units, 251-300 6 units, 301-350 8 units, 351-400 10 units,   Yes [provider]  levETIRAcetam (KEPPRA) 1000 MG tablet Take 1,000 mg by mouth every morning.   Yes [provider]  Melatonin 3 MG CAPS Take 3 mg by mouth at bedtime.   Yes [provider]  oxycodone (OXY-IR) 5 MG capsule Take 5 mg by mouth every 8 (eight) hours as needed for pain.   Yes [provider]  phenytoin (DILANTIN INFATABS) 50 MG tablet Chew 200 mg by mouth 2 (two) times daily.    Yes [provider]  senna (SENOKOT) 8.6 MG TABS tablet Take 8.6 mg by mouth in the morning  and at bedtime.   Yes [provider]  sodium bicarbonate 650 MG tablet Take 650 mg by mouth 4 (four) times daily. For indigestion - midnight, 6am, noon and 6pm   Yes [provider]  topiramate (TOPAMAX) 25 MG tablet Take 1 tablet (25 mg total) by mouth 2 (two) times daily. 11/29/17 11/12/19 Yes Arrien, Jimmy Picket, MD  Vitamin D, Ergocalciferol, (DRISDOL) 1.25 MG (50000 UT) CAPS capsule Take 50,000 Units by mouth every Friday.   Yes [provider]  acetaminophen (TYLENOL) 325 MG tablet Take 650 mg by  mouth every 4 (four) hours as needed for moderate pain.    [provider]  albuterol (PROAIR HFA) 108 (90 Base) MCG/ACT inhaler Inhale 2 puffs into the lungs every 4 (four) hours as needed for wheezing or shortness of breath.    [provider]  carboxymethylcellulose (ARTIFICIAL TEARS) 1 % ophthalmic solution Place 1 drop into both eyes daily as needed (dry eyes).    [provider]  dextrose (GLUTOSE) 40 % GEL Take 1 Tube by mouth every 10 (ten) minutes as needed for low blood sugar (unitl blood sugar is above 70).    [provider]  Emollient (CETAPHIL) cream Apply 1 application topically every 12 (twelve) hours as needed (dry skin).    [provider]  loperamide (IMODIUM) 2 MG capsule Take 4 mg by mouth daily as needed for diarrhea or loose stools.    [provider]  LORazepam (ATIVAN) 2 MG/ML concentrated solution Take 2 mg by mouth every 6 (six) hours as needed for seizure.    [provider]  ondansetron (ZOFRAN ODT) 4 MG disintegrating tablet Take 1 tablet (4 mg total) by mouth every 8 (eight) hours as needed for nausea or vomiting. 02/02/19   Recardo Evangelist, PA-C     Positive ROS: All other systems have been reviewed and were otherwise negative with the exception of those mentioned in the HPI and as above.  Physical Exam: General: Alert, no acute distress Cardiovascular: No pedal  edema Respiratory: No cyanosis, no use of accessory musculature GI: No organomegaly, abdomen is soft and non-tender Skin: No lesions in the area of chief complaint Neurologic: Sensation intact distally Psychiatric: Patient is competent for consent with normal mood and affect Lymphatic: No axillary or cervical lymphadenopathy  MUSCULOSKELETAL: Right hand has sensation intact throughout, positive ecchymosis and swelling and deformity over the right shoulder, all fingers flex extend and abduct.  Assessment: Right proximal humerus fracture with displacement, with multiple coexisting risk factors as indicated above.   Plan: Plan for Procedure(s): OPEN REDUCTION INTERNAL FIXATION (ORIF) PROXIMAL HUMERUS FRACTURE  The risks benefits and alternatives were discussed with the patient including but not limited to the risks of nonoperative treatment, versus surgical intervention including infection, bleeding, nerve injury,  blood clots, cardiopulmonary complications, morbidity, mortality, among others, and they were willing to proceed.    Patient's anticipated LOS is less than 2 midnights, meeting these requirements: - Younger than 91 - Lives within 1 hour of care - Has a competent adult at home to recover with post-op recover - NO history of  - Chronic pain requiring opiods  - Diabetes  - Coronary Artery Disease  - Heart failure  - Heart attack  - Stroke  - DVT/VTE  - Cardiac arrhythmia  - Respiratory Failure/COPD  - Renal failure  - Anemia  - Advanced Liver disease        Johnny Bridge, MD Cell [phone]   04/20/2019 8:14 AM

## 2019-04-20 NOTE — Op Note (Signed)
04/20/2019  10:37 AM  PATIENT:  Erica [name]    PRE-OPERATIVE DIAGNOSIS:  right proximal humerus fracture  POST-OPERATIVE DIAGNOSIS:  Same  PROCEDURE:  OPEN REDUCTION INTERNAL FIXATION (ORIF) PROXIMAL HUMERUS FRACTURE  SURGEON:  Johnny Bridge, MD  PHYSICIAN ASSISTANT: Merlene Pulling, PA-C, present and scrubbed throughout the case, critical for completion in a timely fashion, and for retraction, instrumentation, and closure.  ANESTHESIA:   General with regional block  ESTIMATED BLOOD LOSS: 100 ml  PREOPERATIVE INDICATIONS:  Erica [name] is a  64 y.o. female with a diagnosis of right proximal humerus fracture who elected for surgical management.    The risks benefits and alternatives were discussed with the patient including but not limited to the risks of nonoperative treatment, versus surgical intervention including infection, bleeding, nerve injury, malunion, nonunion, the need for revision surgery, hardware prominence, hardware failure, the need for hardware removal, blood clots, cardiopulmonary complications, conversion to arthroplasty, morbidity, mortality, among others, and they were willing to proceed.  Predicted outcome is good, although there will be at least a six to nine month expected recovery.   OPERATIVE IMPLANTS: Biomet ALPS proximal humerus locking plate. With four distal screws (2 locking, 2 non-locking).  OPERATIVE FINDINGS: Displaced proximal humerus fracture, 2 part  UNIQUE ASPECTS OF THE CASE:  I had to mobilize down the shaft, and released a some of the pec to fit the plate best.  The fracture reduced anatomically.    OPERATIVE PROCEDURE: The patient was brought to the operating room and placed in the supine position. General anesthesia was administered. IV antibiotics were given. She was placed in the beach chair position. All bony prominences were padded. The upper extremity was prepped and draped in usual sterile fashion. Deltopectoral incision was  performed.  I exposed the fracture site, and placed deep retractors. I did not tenotomize the biceps tendon. This was left in place. I elevated a small portion of the deltoid off of the shaft, in order to gain access for the plate. I placed a subscapularis stitche, and then reduced the head onto the shaft. This was maintained in anatomic position.  I applied the plate and secured it into the sliding hole first. I confirmed position of the reduction and the plate with C-arm, and I placed a total of 2 guidewires into the appropriate position in the head and was also able to use a clamp. I was satisfied that the plate was distal appropriately, and then secured the plate proximally with smooth pegs, taking care to prevent penetration into the arch articular surface, using C-arm, as well as manual feel using a hand drill.  I then secured the plate distally using another cortical screw. I then passed the FiberWire sutures from the subscapularis and supraspinatus through the plate and secured the tuberosities. I swapped the inferior anterior and posterior pegs back and forth to minimize risk of being bicortical.  Once complete fixation and reduction of been achieved, took final C-arm pictures, and irrigated the wounds copiously, and repaired the portion of the pec with vicryle, and the deltopectoral interval with Vicryl followed by Vicryl for the subcutaneous tissue with Monocryl and Steri-Strips for the skin. She was placed in a sling. She had a preoperative regional block as well. She tolerated the procedure well with no complications.

## 2019-04-20 NOTE — Progress Notes (Signed)
  [facility] Progress Note   Assessment/ Plan:   Center:[facility]--> orders from last hospitalization here, HD unit closed currently to get updated orders EDW 43.5kg, Time: 3.75 hours, 2K/ 2.5 Ca, BFR 400, DFR 600, Dialyzer: [place] Revacir 200, LUE AVG Heparin 1000 unit load, 500 units/hour (stop 60 min before end of HD)   1. R humerus fracture: s/p ORIF with ortho today.   2.ESRD TTS [facility].  Will do HD off schedule either here or there depending on transport.  No heparin in setting of recent surgery.  Orders written for 2nd shift here in the event that she can't dialyze as OP.  No volume or uremic issues at present.  She is on IVFs with K in them- I will stop.  Avoid NSAIDs, fleets enemas, mag citrate, aluminum-based antacids. 3. 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 Nutrition: renal diet and vitamins 6. Hypertension: on carvedilol 6.25 mg BID 7.  Metabolic acidosis: on sodium bicarb as OP- will order here, TCo2 26 8.  Dispo: pending  Subjective:    Pt is a 31F with ESRD on HD TTS who underwent ORIF of R humerus today, sustained injury 2/20 after a fall.  We are asked to manage ESRD and provide HD if needed while in the hospital.  Pt is to be discharged back to facility [facility]) tomorrow.  She dialyzes at [facility].  Other PMH includes EtOH induced pancreatitis, HTN, HLD, seizures, dCHF, DM.    Pt currently not in any distress.  Last HD was Tuesday.  No pain yet.  No complaints.  Renal navigator has already spoken to [facility]- they have an extra appt tomorrow; no word yet whether or not SNF can transport her there and back.   Objective:   BP (!) 110/41 (BP Location: Right Leg)   Pulse 79   Temp 98.2 F (36.8 C) (Oral)   Resp 17   Ht 4\' 9"  (1.448 m)   Wt 43.5 kg   SpO2 99%   BMI 20.77 kg/m   Physical Exam: Gen: NAD, lying in bed CVS: RRR no m/r/g Resp: clear bilaterally no  c/w/r Abd: soft nontender Ext: no LE edema, R arm in sling with sterile dressing ACCESS: LUE loop AVG  Labs: BMET Recent Labs  Lab 04/20/19 0716  NA 131*  K 4.0  CL 93*  GLUCOSE 126*  BUN 32*  CREATININE 4.10*   CBC Recent Labs  Lab 04/20/19 0716  HGB 10.2*  HCT 30.0*      Medications:    . acetaminophen  500 mg Oral Q6H  . carvedilol  6.25 mg Oral BID  . [START ON 04/21/2019] Chlorhexidine Gluconate Cloth  6 each Topical Q0600  . docusate sodium  100 mg Oral BID  . ferric citrate  420 mg Oral TID WC  . folic acid  1 mg Oral q morning - 10a  . insulin aspart  0-6 Units Subcutaneous TID WC  . levETIRAcetam  1,000 mg Oral q morning - 10a  . phenytoin  200 mg Oral BID  . sodium bicarbonate  650 mg Oral QID  . topiramate  25 mg Oral BID     [name], MD 04/20/2019, 5:45 PM

## 2019-04-20 NOTE — Progress Notes (Signed)
Renal Navigator is awaiting call back from CSW/S. Wicker regarding discharge plan. She can be dialyzed at her OP HD clinic/[facility] if she can get there at noon tomorrow, 04/21/19. She is a long term care patient at [facility] and Renal Navigator needs to know if she can be transported home from HD if discharged straight to OP HD clinic. Renal Navigator to follow closely.  Alphonzo Cruise, [place] Renal Navigator [phone]

## 2019-04-21 DIAGNOSIS — S42201A Unspecified fracture of upper end of right humerus, initial encounter for closed fracture: Secondary | ICD-10-CM | POA: Diagnosis not present

## 2019-04-21 LAB — BASIC METABOLIC PANEL
Anion gap: 14 (ref 5–15)
BUN: 42 mg/dL — ABNORMAL HIGH (ref 8–23)
CO2: 24 mmol/L (ref 22–32)
Calcium: 7.7 mg/dL — ABNORMAL LOW (ref 8.9–10.3)
Chloride: 93 mmol/L — ABNORMAL LOW (ref 98–111)
Creatinine, Ser: 5.33 mg/dL — ABNORMAL HIGH (ref 0.44–1.00)
GFR calc Af Amer: 9 mL/min — ABNORMAL LOW (ref >60)
GFR calc non Af Amer: 8 mL/min — ABNORMAL LOW (ref >60)
Glucose, Bld: 140 mg/dL — ABNORMAL HIGH (ref 70–99)
Potassium: 4.3 mmol/L (ref 3.5–5.1)
Sodium: 131 mmol/L — ABNORMAL LOW (ref 135–145)

## 2019-04-21 LAB — CBC
HCT: 23.3 % — ABNORMAL LOW (ref 36.0–46.0)
Hemoglobin: 8 g/dL — ABNORMAL LOW (ref 12.0–15.0)
MCH: 35.4 pg — ABNORMAL HIGH (ref 26.0–34.0)
MCHC: 34.3 g/dL (ref 30.0–36.0)
MCV: 103.1 fL — ABNORMAL HIGH (ref 80.0–100.0)
Platelets: 124 10*3/uL — ABNORMAL LOW (ref 150–400)
RBC: 2.26 MIL/uL — ABNORMAL LOW (ref 3.87–5.11)
RDW: 13.1 % (ref 11.5–15.5)
WBC: 8.8 10*3/uL (ref 4.0–10.5)
nRBC: 0 % (ref 0.0–0.2)

## 2019-04-21 LAB — SARS CORONAVIRUS 2 (TAT 6-24 HRS): SARS Coronavirus 2: NEGATIVE

## 2019-04-21 LAB — GLUCOSE, CAPILLARY: Glucose-Capillary: 144 mg/dL — ABNORMAL HIGH (ref 70–99)

## 2019-04-21 MED ORDER — OXYCODONE HCL 5 MG PO TABS: 5.0000 mg | ORAL_TABLET | Freq: Four times a day (QID) | ORAL | 0 refills | Status: AC | PRN

## 2019-04-21 NOTE — Discharge Summary (Signed)
Discharge Summary  Patient ID: Erica [name] MRN: [id number] DOB/AGE: 65-Apr-1957 65 y.o.  Admit date: 04/20/2019 Discharge date: 04/21/2019  Admission Diagnoses:  Closed fracture of right proximal humerus  Discharge Diagnoses:  Principal Problem:   Closed fracture of right proximal humerus Active Problems:   Seizure disorder (HCC)   Chronic diastolic congestive heart failure (HCC)   Diabetes mellitus type 2 in nonobese (HCC)   ESRD (end stage renal disease) ([place])   Surgery, elective   Past Medical History:  Diagnosis Date  . Alcohol-induced pancreatitis   . Chronic diarrhea   . Depression   . Diabetes mellitus    fasting blood sugar 110-120s  . Diastolic CHF ([place])   . DKA (diabetic ketoacidoses) ([place])   . Gastroparesis   . GERD (gastroesophageal reflux disease)   . Heart murmur   . History of kidney stones   . Hyperlipidemia   . Hypertension   . Hypokalemia   . Muscle spasm   . Neuropathic pain   . Neuropathy    Hx: of  . Pyelonephritis   . Seizures ([place])   . Vitamin B12 deficiency   . Vitamin D deficiency     Surgeries: Procedure(s): OPEN REDUCTION INTERNAL FIXATION (ORIF) PROXIMAL HUMERUS FRACTURE on 04/20/2019   Consultants (if any): Treatment Team:  [name], MD  Discharged Condition: Improved  Hospital Course: Erica [name] is an 64 y.o. female who was admitted 04/20/2019 with a diagnosis of Closed fracture of right proximal humerus and went to the operating room on 04/20/2019 and underwent the above named procedures.    She was given perioperative antibiotics:  Anti-infectives (From admission, onward)   Start     Dose/Rate Route Frequency Ordered Stop   04/21/19 1200  ceFAZolin (ANCEF) IVPB 1 g/50 mL premix     1 g 100 mL/hr over 30 Minutes Intravenous Every 24 hours 04/20/19 1240 04/24/19 1159   04/20/19 0700  ceFAZolin (ANCEF) IVPB 2g/100 mL premix     2 g 200 mL/hr over 30 Minutes Intravenous On call to O.R. 04/20/19 9702 04/20/19 0905     .  She was given sequential compression devices and early ambulation for DVT prophylaxis.  She benefited maximally from the hospital stay and there were no complications.    Recent vital signs:  Vitals:   04/21/19 0320 04/21/19 0751  BP: (!) 114/45 (!) 133/37  Pulse: 79 80  Resp: 16 18  Temp: (!) 97.5 F (36.4 C) 99.1 F (37.3 C)  SpO2: 96% 94%    Recent laboratory studies:  Lab Results  Component Value Date   HGB 8.0 (L) 04/21/2019   HGB 10.2 (L) 04/20/2019   HGB 13.0 02/02/2019   Lab Results  Component Value Date   WBC 8.8 04/21/2019   PLT 124 (L) 04/21/2019   Lab Results  Component Value Date   INR 1.33 12/15/2017   Lab Results  Component Value Date   NA 131 (L) 04/21/2019   K 4.3 04/21/2019   CL 93 (L) 04/21/2019   CO2 24 04/21/2019   BUN 42 (H) 04/21/2019   CREATININE 5.33 (H) 04/21/2019   GLUCOSE 140 (H) 04/21/2019    Discharge Medications:   Allergies as of 04/21/2019      Reactions   Aspirin Palpitations   Listed on [facility] 11/12/18      Medication List    STOP taking these medications   oxycodone 5 MG capsule Commonly known as: OXY-IR Replaced by: oxyCODONE 5 MG immediate release tablet  TAKE these medications   acetaminophen 325 MG tablet Commonly known as: TYLENOL Take 650 mg by mouth every 4 (four) hours as needed for moderate pain.   Artificial Tears 1 % ophthalmic solution Generic drug: carboxymethylcellulose Place 1 drop into both eyes daily as needed (dry eyes).   calcitRIOL 0.25 MCG capsule Commonly known as: ROCALTROL Take 1 capsule (0.25 mcg total) by mouth Every Tuesday,Thursday,and Saturday with dialysis.   carvedilol 6.25 MG tablet Commonly known as: COREG Take 1 tablet (6.25 mg total) by mouth 2 (two) times daily.   cefpodoxime 200 MG tablet Commonly known as: VANTIN Take 200 mg by mouth See admin instructions. Take 200 mg once a day on Tue, Thurs, and Sat for UTI   cetaphil cream Apply 1 application topically  every 12 (twelve) hours as needed (dry skin).   cinacalcet 30 MG tablet Commonly known as: SENSIPAR Take 30 mg by mouth every evening.   dextrose 40 % Gel Commonly known as: GLUTOSE Take 1 Tube by mouth every 10 (ten) minutes as needed for low blood sugar (unitl blood sugar is above 70).   Dilantin Infatabs 50 MG tablet Generic drug: phenytoin Chew 200 mg by mouth 2 (two) times daily.   ferric citrate 1 GM 210 MG(Fe) tablet Commonly known as: AURYXIA Take 2 tablets (420 mg total) by mouth 3 (three) times daily with meals.   folic acid 1 MG tablet Commonly known as: FOLVITE Take 1 mg by mouth every morning.   insulin lispro 100 UNIT/ML injection Commonly known as: HUMALOG Inject 2-10 Units into the skin 4 (four) times daily. Per sliding scale: CBG 151-200 2 units, 201-250 4 units, 251-300 6 units, 301-350 8 units, 351-400 10 units,   levETIRAcetam 1000 MG tablet Commonly known as: KEPPRA Take 1,000 mg by mouth every morning.   loperamide 2 MG capsule Commonly known as: IMODIUM Take 4 mg by mouth daily as needed for diarrhea or loose stools.   LORazepam 2 MG/ML concentrated solution Commonly known as: ATIVAN Take 2 mg by mouth every 6 (six) hours as needed for seizure.   Melatonin 3 MG Caps Take 3 mg by mouth at bedtime.   ondansetron 4 MG disintegrating tablet Commonly known as: Zofran ODT Take 1 tablet (4 mg total) by mouth every 8 (eight) hours as needed for nausea or vomiting.   oxyCODONE 5 MG immediate release tablet Commonly known as: Roxicodone Take 1 tablet (5 mg total) by mouth every 6 (six) hours as needed for up to 7 days for severe pain. Replaces: oxycodone 5 MG capsule   ProAir HFA 108 (90 Base) MCG/ACT inhaler Generic drug: albuterol Inhale 2 puffs into the lungs every 4 (four) hours as needed for wheezing or shortness of breath.   senna 8.6 MG Tabs tablet Commonly known as: SENOKOT Take 8.6 mg by mouth in the morning and at bedtime.   sodium  bicarbonate 650 MG tablet Take 650 mg by mouth 4 (four) times daily. For indigestion - midnight, 6am, noon and 6pm   topiramate 25 MG tablet Commonly known as: TOPAMAX Take 1 tablet (25 mg total) by mouth 2 (two) times daily.   Vitamin D (Ergocalciferol) 1.25 MG (50000 UNIT) Caps capsule Commonly known as: DRISDOL Take 50,000 Units by mouth every Friday.       Diagnostic Studies: DG Chest Portable 1 View  Result Date: 04/08/2019 CLINICAL DATA:  64 year old female with fall. EXAM: PORTABLE CHEST 1 VIEW COMPARISON:  Chest radiograph dated 04/06/2019. FINDINGS: Mild cardiomegaly. No  significant congestion or edema. No focal consolidation, pleural effusion, or pneumothorax. Displaced and angulated fracture of the proximal right humeral diaphysis. IMPRESSION: 1. No acute cardiopulmonary process. 2. Right humeral fracture. Electronically Signed   By: Anner Crete M.D.   On: 04/08/2019 19:55   DG Shoulder Right Portable  Result Date: 04/08/2019 CLINICAL DATA:  64 year old female with fall and trauma to the right shoulder. EXAM: PORTABLE RIGHT SHOULDER COMPARISON:  Chest radiograph dated 02/02/2019. FINDINGS: There is a displaced fracture of the proximal right humeral diaphysis. There is proximal migration of the humeral shaft. The bones are osteopenic. There is no dislocation. The soft tissues are unremarkable. IMPRESSION: Displaced fracture of the proximal humeral diaphysis. Electronically Signed   By: Anner Crete M.D.   On: 04/08/2019 19:35   DG Humerus Right  Result Date: 04/20/2019 CLINICAL DATA:  surgery EXAM: DG C-ARM 1-60 MIN; RIGHT HUMERUS - 2+ VIEW CONTRAST:  None. FLUOROSCOPY TIME:  Fluoroscopy Time:  2 minutes 42 seconds Radiation Exposure Index (if provided by the fluoroscopic device): 11 point 8 mGy COMPARISON:  04/08/2019. FINDINGS: Plate and screw fixation of proximal right humerus. Hardware intact. Anatomic alignment. IMPRESSION: Postsurgical changes right humerus.  Electronically Signed   By: Marcello Moores  Register   On: 04/20/2019 11:11   DG C-Arm 1-60 Min  Result Date: 04/20/2019 CLINICAL DATA:  surgery EXAM: DG C-ARM 1-60 MIN; RIGHT HUMERUS - 2+ VIEW CONTRAST:  None. FLUOROSCOPY TIME:  Fluoroscopy Time:  2 minutes 42 seconds Radiation Exposure Index (if provided by the fluoroscopic device): 11 point 8 mGy COMPARISON:  04/08/2019. FINDINGS: Plate and screw fixation of proximal right humerus. Hardware intact. Anatomic alignment. IMPRESSION: Postsurgical changes right humerus. Electronically Signed   By: Marcello Moores  Register   On: 04/20/2019 11:11    Disposition: Discharge disposition: 01-Home or Self Care         Follow-up Information    Marchia Bond, MD. Schedule an appointment as soon as possible for a visit in 2 weeks.   Specialty: Orthopedic Surgery Contact information: [address] [place] [address] [phone]            Signed: Jola Baptist 04/21/2019, 8:51 AM

## 2019-04-21 NOTE — Evaluation (Signed)
Occupational Therapy Evaluation Patient Details Name: Erica [name] MRN: [id number] DOB: [birth year] Today's Date: 04/21/2019    History of Present Illness 64 y o female s/p fall resulting in R proximal humerus fx.  s/pORIF.  PMH:  DM, CHF, chronic diarrhea, HTN, on dialysis   Clinical Impression   Pt was admitted for the above sx.  She verbalizes understanding of all education and handouts provided.  Will follow in acute setting focusing on guiding caregivers to safely assist her following her shoulder precautions and encourage independence in distal ROM exercises.    Follow Up Recommendations  SNF    Equipment Recommendations  3 in 1 bedside commode(has w/c)    Recommendations for Other Services  PT consult     Precautions / Restrictions Precautions Precautions: Shoulder;Fall Type of Shoulder Precautions: sling on except for bathing, dressing and exercise.  No shoulder ROM, may move elbwo, wrist, and fingers Shoulder Interventions: Shoulder sling/immobilizer Precaution Booklet Issued: Yes (comment) Restrictions Weight Bearing Restrictions: Yes Other Position/Activity Restrictions: NWB R UE      Mobility Bed Mobility Overal bed mobility: Needs Assistance Bed Mobility: Supine to Sit;Sit to Supine     Supine to sit: Min assist Sit to supine: Mod assist   General bed mobility comments: use of rail, HOB raised  Transfers Overall transfer level: Needs assistance Equipment used: 1 person hand held assist Transfers: Sit to/from [facility] Sit to Stand: Min assist;Mod assist Stand pivot transfers: Min assist;Mod assist       General transfer comment: min to mod A to stand and pivot. Did better going towards L side; posterior lean at times    Balance Overall balance assessment: History of Falls                                         ADL either performed or assessed with clinical judgement   ADL Overall ADL's : Needs  assistance/impaired Eating/Feeding: Set up Eating/Feeding Details (indicate cue type and reason): with non dominant hand Grooming: Minimal assistance Grooming Details (indicate cue type and reason): wtih non dominant hand Upper Body Bathing: Moderate assistance   Lower Body Bathing: Maximal assistance   Upper Body Dressing : Maximal assistance   Lower Body Dressing: Total assistance   Toilet Transfer: Moderate assistance;BSC;RW;Stand-pivot   Toileting- Clothing Manipulation and Hygiene: Total assistance;Sit to/from stand         General ADL Comments: reviewed shoulder protocol, reapplied sling, worked on distal exercises and got up to commode and donned panties and pad. Pt tends to lean posteriorly in standing     Vision         Perception     Praxis      Pertinent Vitals/Pain Pain Assessment: No/denies pain     Hand Dominance Right   Extremity/Trunk Assessment Upper Extremity Assessment Upper Extremity Assessment: (able to move R fingers, wrist, supinate and partial elbow)           Communication Communication Communication: No difficulties   Cognition Arousal/Alertness: Awake/alert Behavior During Therapy: WFL for tasks assessed/performed                                   General Comments: appears wfls for OT eval   General Comments  will provide extended velfoam thumb loop. Pt is in a small sling which  comes between wrist and knuckles. Med would be too large.  Thumb loop as it is, would pull on thumb    Exercises Exercises: (distal ROM, RUE)   Shoulder Instructions      Home Living Family/patient expects to be discharged to:: Skilled nursing facility                                 Additional Comments: from [facility]      Prior Functioning/Environment          Comments: pt reports that sometimes she had help for adls and she had assistance to walk with walker and get up to w/c        OT Problem List:  Decreased strength;Decreased activity tolerance;Impaired balance (sitting and/or standing);Impaired UE functional use;Decreased knowledge of use of DME or AE      OT Treatment/Interventions:      OT Goals(Current goals can be found in the care plan section) Acute Rehab OT Goals Patient Stated Goal: none stated OT Goal Formulation: All assessment and education complete, DC therapy ADL Goals Additional ADL Goal #1: pt will verbally guide caregiver through ADL sequence/adaptations and positioning RUE in sling and bed Additional ADL Goal #2: Pt will be independent with distal ROM exercises, as allowed  OT Frequency:     Barriers to D/C:            Co-evaluation              AM-PAC OT "6 Clicks" Daily Activity     Outcome Measure Help from another person eating meals?: A Little Help from another person taking care of personal grooming?: A Little Help from another person toileting, which includes using toliet, bedpan, or urinal?: Total Help from another person bathing (including washing, rinsing, drying)?: A Lot Help from another person to put on and taking off regular upper body clothing?: A Lot Help from another person to put on and taking off regular lower body clothing?: A Lot 6 Click Score: 13   End of Session    Activity Tolerance: Patient tolerated treatment well Patient left: in bed;with call bell/phone within reach;with bed alarm set  OT Visit Diagnosis: Muscle weakness (generalized) (M62.81)                Time: 4142-3953 OT Time Calculation (min): 27 min Charges:  OT General Charges $OT Visit: 1 Visit OT Evaluation $OT Eval Moderate Complexity: 1 Mod  Erica [name] S, OTR/L Acute Rehabilitation Services 04/21/2019  Erica [name] 04/21/2019, 10:29 AM

## 2019-04-21 NOTE — Progress Notes (Signed)
Renal Navigator attempted again to call Erica [name] at [facility] but she was not available at this time. Renal Navigator called back and spoke to Erica [name] with transportation. She stated that it would not be a problem for the SNF to arrange [facility] to pick up patient from OP HD clinic/[facility] when she has completed treatment today.  Renal Navigator confirmed this plan with CSW/Erica [name] who will complete discharge plan with SNF and arrange PTAR transport to OP HD clinic from hospital. Bedside RN notified.  Renal Navigator spoke with patient to inform her of plan. She is smiling this morning and states she is not in pain and agreeable to plan. No further needs identified. Patient will NOT have inpt HD today. She will have HD treatment at her OP HD clinic and then return to [facility]. Renal Navigator appreciates team coordination in care of this patient.  Alphonzo Cruise, [place] Renal Navigator [phone]

## 2019-04-21 NOTE — TOC Transition Note (Signed)
Transition of Care [facility]) - CM/SW Discharge Note   Patient Details  Name: Erica [name] MRN: [id number] Date of Birth: Apr 21, 1955  [facility] CM/SW Contact:  Atilano Median, LCSW Phone Number: 04/21/2019, 9:06 AM   Clinical Narrative:     Discharged back to SNF. Return coordinated with [name]. Patient aware and agreeable to this plan. Number to call report [phone] given to unit RN Caren Griffins. Patient will be transported to [facility] by [facility] then SNF will pickup from HD clinic. DC summary sent via Epic. No other needs at this time. Case closed to this CSW.   Final next level of care: Skilled Nursing Facility Barriers to Discharge: Barriers Resolved   Patient Goals and CMS Choice        Discharge Placement   Existing PASRR number confirmed : 04/21/19          Patient chooses bed at: [facility] Patient to be transferred to facility by: [place] Name of family member notified: patient Patient and family notified of of transfer: 04/21/19  Discharge Plan and Services                                     Social Determinants of Health (SDOH) Interventions     Readmission Risk Interventions No flowsheet data found.

## 2019-04-21 NOTE — Progress Notes (Signed)
Report was given to [facility] Loss adjuster, chartered) at [facility].  She was made aware of the patient going to her dialysis treatment clinic today.  PTAR has been scheduled for 11 am. The patient and nursing staff is aware that the patient will then be transported via PTAR to nursing facility.

## 2019-04-21 NOTE — Progress Notes (Signed)
Subjective: 1 Day Post-Op s/p Procedure(s): OPEN REDUCTION INTERNAL FIXATION (ORIF) PROXIMAL HUMERUS FRACTURE   Patient is alert, oriented,laying comfortably in bed. She reports pain in right shoulder to be moderate. Patient denies chest pain, shortness of breath, nausea, vomiting, or fatigue.   Objective:  PE: VITALS:   Vitals:   04/20/19 1900 04/20/19 1954 04/20/19 2351 04/21/19 0320  BP: 114/79 100/62 (!) 120/43 (!) 114/45  Pulse: 97 78 77 79  Resp: 18 16 17 16   Temp: (!) 97.4 F (36.3 C) (!) 97 F (36.1 C) 97.8 F (36.6 C) (!) 97.5 F (36.4 C)  TempSrc: Oral   Oral  SpO2: 99% 100% 98% 96%  Weight:      Height:       General: in no acute distress, laying comfortably in bed Resp: no use of accessory musculature Abd: soft, non-tender MSK: RUE in sling, surgical dressing intact with scant drainage. Able to flex, extend, and abduct all fingers of right hand. Distal sensation intact. 2+ radial pulse RUE.   LABS  Results for orders placed or performed during the hospital encounter of 04/20/19 (from the past 24 hour(s))  Glucose, capillary     Status: Abnormal   Collection Time: 04/20/19 11:11 AM  Result Value Ref Range   Glucose-Capillary 129 (H) 70 - 99 mg/dL  Hemoglobin A1c     Status: None   Collection Time: 04/20/19  2:31 PM  Result Value Ref Range   Hgb A1c MFr Bld 5.6 4.8 - 5.6 %   Mean Plasma Glucose 114.02 mg/dL  Glucose, capillary     Status: Abnormal   Collection Time: 04/20/19  4:20 PM  Result Value Ref Range   Glucose-Capillary 230 (H) 70 - 99 mg/dL  SARS CORONAVIRUS 2 (TAT 6-24 HRS) Nasopharyngeal Nasopharyngeal Swab     Status: None   Collection Time: 04/20/19  4:26 PM   Specimen: Nasopharyngeal Swab  Result Value Ref Range   SARS Coronavirus 2 NEGATIVE NEGATIVE  Glucose, capillary     Status: Abnormal   Collection Time: 04/20/19  7:54 PM  Result Value Ref Range   Glucose-Capillary 261 (H) 70 - 99 mg/dL  CBC     Status: Abnormal   Collection  Time: 04/21/19  5:00 AM  Result Value Ref Range   WBC 8.8 4.0 - 10.5 K/uL   RBC 2.26 (L) 3.87 - 5.11 MIL/uL   Hemoglobin 8.0 (L) 12.0 - 15.0 g/dL   HCT 23.3 (L) 36.0 - 46.0 %   MCV 103.1 (H) 80.0 - 100.0 fL   MCH 35.4 (H) 26.0 - 34.0 pg   MCHC 34.3 30.0 - 36.0 g/dL   RDW 13.1 11.5 - 15.5 %   Platelets 124 (L) 150 - 400 K/uL   nRBC 0.0 0.0 - 0.2 %  Glucose, capillary     Status: Abnormal   Collection Time: 04/21/19  6:41 AM  Result Value Ref Range   Glucose-Capillary 144 (H) 70 - 99 mg/dL    DG Humerus Right  Result Date: 04/20/2019 CLINICAL DATA:  surgery EXAM: DG C-ARM 1-60 MIN; RIGHT HUMERUS - 2+ VIEW CONTRAST:  None. FLUOROSCOPY TIME:  Fluoroscopy Time:  2 minutes 42 seconds Radiation Exposure Index (if provided by the fluoroscopic device): 11 point 8 mGy COMPARISON:  04/08/2019. FINDINGS: Plate and screw fixation of proximal right humerus. Hardware intact. Anatomic alignment. IMPRESSION: Postsurgical changes right humerus. Electronically Signed   By: [place]   On: 04/20/2019 11:11   DG C-Arm 1-60  Min  Result Date: 04/20/2019 CLINICAL DATA:  surgery EXAM: DG C-ARM 1-60 MIN; RIGHT HUMERUS - 2+ VIEW CONTRAST:  None. FLUOROSCOPY TIME:  Fluoroscopy Time:  2 minutes 42 seconds Radiation Exposure Index (if provided by the fluoroscopic device): 11 point 8 mGy COMPARISON:  04/08/2019. FINDINGS: Plate and screw fixation of proximal right humerus. Hardware intact. Anatomic alignment. IMPRESSION: Postsurgical changes right humerus. Electronically Signed   By: [place]   On: 04/20/2019 11:11    Assessment/Plan: Principal Problem:   Closed fracture of right proximal humerus Active Problems:   Seizure disorder (HCC)   Chronic diastolic congestive heart failure (HCC)   Diabetes mellitus type 2 in nonobese (HCC)   ESRD (end stage renal disease) (HCC)   Surgery, elective  1 Day Post-Op s/p Procedure(s): OPEN REDUCTION INTERNAL FIXATION (ORIF) PROXIMAL HUMERUS FRACTURE  No  acute events overnight. Patient ok for discharge home this morning if she has transportation to outpatient dialysis this afternoon. If not, we will keep her inpatient for dialysis here this afternoon. Will wait to hear from [facility] team regarding ability to be discharged this morning.   Weightbearing: NWB RUE Insicional and dressing care: Aquacel dressing starting to bubble up on edges, dressing changed to sterile mepilex Orthopedic device(s): RUE in sling, patient attempting to move right shoulder and counseled to continue to rest right shoulder in sling until follow-up visit VTE prophylaxis: Scd's Pain control: Continue PRN pain control regimen Follow - up plan: Follow-up with Dr. Mardelle Matte in 2 weeks.   Contact information:   Weekdays 8-5 [address], [place] [phone] A fter hours and holidays please check [email or web address] for group call information for [facility]  Ventura Bruns 04/21/2019, 7:38 AM

## 2019-04-21 NOTE — Progress Notes (Signed)
[facility] Progress Note     Assessment/ Plan:   Center:[facility]--> orders from last hospitalization here, HD unit closed currently to get updated orders EDW 43.5kg, Time: 3.75 hours, 2K/ 2.5 Ca, BFR 400, DFR 600, Dialyzer: [place] Revacir 200, LUE AVG Heparin 1000 unit load, 500 units/hour (stop 60 min before end of HD)  1. R humerus fracture: s/p ORIF with ortho today.   2.ESRD TTS [facility]  Planning HD this afternoon  off schedule and then again Sat if she's still here. No heparin in setting of recent surgery.   No volume or uremic issues at present.    Avoid NSAIDs, fleets enemas, mag citrate, aluminum-based antacids. 3. 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 Nutrition: renal diet and vitamins 6. Hypertension: on carvedilol 6.25 mg BID 7.  Metabolic acidosis: on sodium bicarb as OP- will order here, TCo2 26 8.  Dispo: pending  Subjective:   C/o h/a and hip pain. Denies n/v/dyspnea   Objective:   BP (!) 114/45 (BP Location: Left Leg)   Pulse 79   Temp (!) 97.5 F (36.4 C) (Oral)   Resp 16   Ht 4\' 9"  (1.448 m)   Wt 43.5 kg   SpO2 96%   BMI 20.77 kg/m   Intake/Output Summary (Last 24 hours) at 04/21/2019 0737 Last data filed at 04/21/2019 8341 Gross per 24 hour  Intake 859.97 ml  Output 75 ml  Net 784.97 ml   Weight change:   Physical Exam: Gen: NAD, lying in bed CVS: RRR no m/r/g Resp: clear bilaterally no c/w/r Abd: soft nontender Ext: no LE edema, R arm in sling with sterile dressing ACCESS: LUE loop AVG +thrill  Imaging: DG Humerus Right  Result Date: 04/20/2019 CLINICAL DATA:  surgery EXAM: DG C-ARM 1-60 MIN; RIGHT HUMERUS - 2+ VIEW CONTRAST:  None. FLUOROSCOPY TIME:  Fluoroscopy Time:  2 minutes 42 seconds Radiation Exposure Index (if provided by the fluoroscopic device): 11 point 8 mGy COMPARISON:  04/08/2019. FINDINGS: Plate and screw fixation of proximal right  humerus. Hardware intact. Anatomic alignment. IMPRESSION: Postsurgical changes right humerus. Electronically Signed   By: Marcello Moores  Register   On: 04/20/2019 11:11   DG C-Arm 1-60 Min  Result Date: 04/20/2019 CLINICAL DATA:  surgery EXAM: DG C-ARM 1-60 MIN; RIGHT HUMERUS - 2+ VIEW CONTRAST:  None. FLUOROSCOPY TIME:  Fluoroscopy Time:  2 minutes 42 seconds Radiation Exposure Index (if provided by the fluoroscopic device): 11 point 8 mGy COMPARISON:  04/08/2019. FINDINGS: Plate and screw fixation of proximal right humerus. Hardware intact. Anatomic alignment. IMPRESSION: Postsurgical changes right humerus. Electronically Signed   By: Marcello Moores  Register   On: 04/20/2019 11:11    Labs: BMET Recent Labs  Lab 04/20/19 0716  NA 131*  K 4.0  CL 93*  GLUCOSE 126*  BUN 32*  CREATININE 4.10*   CBC Recent Labs  Lab 04/20/19 0716 04/21/19 0500  WBC  --  8.8  HGB 10.2* 8.0*  HCT 30.0* 23.3*  MCV  --  103.1*  PLT  --  124*    Medications:    . carvedilol  6.25 mg Oral BID  . Chlorhexidine Gluconate Cloth  6 each Topical Q0600  . docusate sodium  100 mg Oral BID  . ferric citrate  420 mg Oral TID WC  . folic acid  1 mg Oral q morning - 10a  . insulin aspart  0-6 Units Subcutaneous TID WC  .  levETIRAcetam  1,000 mg Oral q morning - 10a  . phenytoin  200 mg Oral BID  . sodium bicarbonate  650 mg Oral QID  . topiramate  25 mg Oral BID      Otelia Santee, MD 04/21/2019, 7:37 AM

## 2019-04-25 ENCOUNTER — Encounter: Payer: Self-pay | Admitting: *Deleted

## 2019-05-24 ENCOUNTER — Emergency Department (HOSPITAL_COMMUNITY)
Admission: EM | Admit: 2019-05-24 | Discharge: 2019-05-24 | Disposition: A | Discharge status: Home / Self Care | Payer: Medicaid Other | Attending: Emergency Medicine | Admitting: Emergency Medicine

## 2019-05-24 ENCOUNTER — Other Ambulatory Visit: Payer: Self-pay

## 2019-05-24 ENCOUNTER — Encounter (HOSPITAL_COMMUNITY): Payer: Self-pay | Admitting: Emergency Medicine

## 2019-05-24 ENCOUNTER — Emergency Department (HOSPITAL_COMMUNITY): Payer: Medicaid Other

## 2019-05-24 DIAGNOSIS — E860 Dehydration: Secondary | ICD-10-CM

## 2019-05-24 DIAGNOSIS — E1122 Type 2 diabetes mellitus with diabetic chronic kidney disease: Secondary | ICD-10-CM | POA: Diagnosis not present

## 2019-05-24 DIAGNOSIS — R531 Weakness: Secondary | ICD-10-CM

## 2019-05-24 DIAGNOSIS — I252 Old myocardial infarction: Secondary | ICD-10-CM | POA: Insufficient documentation

## 2019-05-24 DIAGNOSIS — I132 Hypertensive heart and chronic kidney disease with heart failure and with stage 5 chronic kidney disease, or end stage renal disease: Secondary | ICD-10-CM | POA: Insufficient documentation

## 2019-05-24 DIAGNOSIS — Z992 Dependence on renal dialysis: Secondary | ICD-10-CM | POA: Diagnosis not present

## 2019-05-24 DIAGNOSIS — Z8673 Personal history of transient ischemic attack (TIA), and cerebral infarction without residual deficits: Secondary | ICD-10-CM | POA: Diagnosis not present

## 2019-05-24 DIAGNOSIS — N186 End stage renal disease: Secondary | ICD-10-CM | POA: Diagnosis not present

## 2019-05-24 DIAGNOSIS — Z794 Long term (current) use of insulin: Secondary | ICD-10-CM | POA: Diagnosis not present

## 2019-05-24 DIAGNOSIS — I5032 Chronic diastolic (congestive) heart failure: Secondary | ICD-10-CM | POA: Diagnosis not present

## 2019-05-24 LAB — CBC WITH DIFFERENTIAL/PLATELET
Abs Immature Granulocytes: 0.01 10*3/uL (ref 0.00–0.07)
Basophils Absolute: 0 10*3/uL (ref 0.0–0.1)
Basophils Relative: 0 %
Eosinophils Absolute: 0.2 10*3/uL (ref 0.0–0.5)
Eosinophils Relative: 3 %
HCT: 33.8 % — ABNORMAL LOW (ref 36.0–46.0)
Hemoglobin: 11.2 g/dL — ABNORMAL LOW (ref 12.0–15.0)
Immature Granulocytes: 0 %
Lymphocytes Relative: 28 %
Lymphs Abs: 1.7 10*3/uL (ref 0.7–4.0)
MCH: 36 pg — ABNORMAL HIGH (ref 26.0–34.0)
MCHC: 33.1 g/dL (ref 30.0–36.0)
MCV: 108.7 fL — ABNORMAL HIGH (ref 80.0–100.0)
Monocytes Absolute: 0.5 10*3/uL (ref 0.1–1.0)
Monocytes Relative: 9 %
Neutro Abs: 3.6 10*3/uL (ref 1.7–7.7)
Neutrophils Relative %: 60 %
Platelets: 142 10*3/uL — ABNORMAL LOW (ref 150–400)
RBC: 3.11 MIL/uL — ABNORMAL LOW (ref 3.87–5.11)
RDW: 12.5 % (ref 11.5–15.5)
WBC: 6.1 10*3/uL (ref 4.0–10.5)
nRBC: 0 % (ref 0.0–0.2)

## 2019-05-24 LAB — COMPREHENSIVE METABOLIC PANEL
ALT: 65 U/L — ABNORMAL HIGH (ref 0–44)
AST: 94 U/L — ABNORMAL HIGH (ref 15–41)
Albumin: 3.1 g/dL — ABNORMAL LOW (ref 3.5–5.0)
Alkaline Phosphatase: 136 U/L — ABNORMAL HIGH (ref 38–126)
Anion gap: 12 (ref 5–15)
BUN: 17 mg/dL (ref 8–23)
CO2: 27 mmol/L (ref 22–32)
Calcium: 8.1 mg/dL — ABNORMAL LOW (ref 8.9–10.3)
Chloride: 97 mmol/L — ABNORMAL LOW (ref 98–111)
Creatinine, Ser: 3.36 mg/dL — ABNORMAL HIGH (ref 0.44–1.00)
GFR calc Af Amer: 16 mL/min — ABNORMAL LOW (ref >60)
GFR calc non Af Amer: 14 mL/min — ABNORMAL LOW (ref >60)
Glucose, Bld: 234 mg/dL — ABNORMAL HIGH (ref 70–99)
Potassium: 3.1 mmol/L — ABNORMAL LOW (ref 3.5–5.1)
Sodium: 136 mmol/L (ref 135–145)
Total Bilirubin: 0.4 mg/dL (ref 0.3–1.2)
Total Protein: 6.2 g/dL — ABNORMAL LOW (ref 6.5–8.1)

## 2019-05-24 LAB — AMMONIA: Ammonia: 54 umol/L — ABNORMAL HIGH (ref 9–35)

## 2019-05-24 LAB — TROPONIN I (HIGH SENSITIVITY): Troponin I (High Sensitivity): 19 ng/L — ABNORMAL HIGH (ref <18)

## 2019-05-24 LAB — MAGNESIUM: Magnesium: 1.9 mg/dL (ref 1.7–2.4)

## 2019-05-24 MED ORDER — LACTATED RINGERS IV BOLUS
500.0000 mL | Freq: Once | INTRAVENOUS | Status: AC
  Administered 2019-05-24: 500 mL via INTRAVENOUS

## 2019-05-24 NOTE — ED Provider Notes (Signed)
Emergency Department Provider Note   I have reviewed the triage vital signs and the nursing notes.   HISTORY  Chief Complaint Weakness   HPI Erica [name] is a 64 y.o. female who presents the emerge department today secondary to weakness.  Patient states that she is not had any trauma or anything recently but does felt this way after dialysis today.  It was a normal dialysis status.  She is been on dialysis since August..   No recent illnesses.  No focal deficits just generalized weakness.  Rarely makes urine but no changes in that.  No chest pain, back pain, cough, fever or other associated symptoms.  She does state her ankles are little bit more edematous than normal.  No other associated or modifying symptoms.    Past Medical History:  Diagnosis Date   Alcohol-induced pancreatitis    Chronic diarrhea    Depression    Diabetes mellitus    fasting blood sugar 500-938H   Diastolic CHF (HCC)    DKA (diabetic ketoacidoses) (HCC)    Gastroparesis    GERD (gastroesophageal reflux disease)    Heart murmur    History of kidney stones    Hyperlipidemia    Hypertension    Hypokalemia    Muscle spasm    Neuropathic pain    Neuropathy    Hx: of   Pyelonephritis    Seizures ([place])    Vitamin B12 deficiency    Vitamin D deficiency     Patient Active Problem List   Diagnosis Date Noted   Closed fracture of right proximal humerus 04/20/2019   Surgery, elective    Seizure ([place]) 11/12/2018   History of hydronephrosis --stents in place 12/08/2017   Stroke ([place]) 11/29/2017   Seizures ([place])    Hydronephrosis    Recurrent UTI    Diabetes mellitus type 2 in nonobese ([place])    ESRD (end stage renal disease) ([place])    Anemia of chronic disease    Chronic diastolic congestive heart failure ([place])    Essential hypertension    PICC (peripherally inserted central catheter) in place    Acute pyelonephritis 11/12/2017   Uncontrolled type 2  diabetes mellitus with hyperglycemia, with long-term current use of insulin ([place]) 11/10/2017   Renal failure 11/08/2017   Seizure disorder ([place]) 11/08/2017   Orthostatic hypotension 07/25/2014   Moderate protein malnutrition ([place]) 09/15/2013   Aspiration pneumonia ([place]) 09/15/2013   Acute respiratory failure requiring reintubation ([place]) 09/11/2013   probable Seizures due to metabolic disorder 82/99/3716   Lactic acidosis 03/19/2013   Abdominal pain 03/19/2013   Rotavirus infection 10/29/2012   Type II or unspecified type diabetes mellitus without mention of complication, uncontrolled 10/29/2012   Protein-calorie malnutrition, severe ([place]) 10/27/2012   NSTEMI (non-ST elevated myocardial infarction) ([place]) 10/26/2012   Fever, unspecified 10/26/2012   Hypotension 96/78/9381   Metabolic acidosis 01/75/1025   Chronic diarrhea 10/25/2012   Tobacco abuse 10/25/2012   DKA (diabetic ketoacidoses) ([place]) 09/09/2012   Dehydration 09/09/2012   DKA, type 2 ([place]) 05/20/2012   Abnormal LFTs 05/20/2012   Heart murmur, systolic 85/27/7824   Hypoglycemia 23/53/6144   Metabolic encephalopathy 31/54/0086   Alcohol abuse 07/08/2011   Hypokalemia 07/08/2011   Nausea & vomiting 07/08/2011   H/O chronic pancreatitis 07/08/2011    Past Surgical History:  Procedure Laterality Date   AV FISTULA PLACEMENT Left 12/15/2017   Procedure: INSERTION OF ARTERIOVENOUS (AV) GORE-TEX GRAFT ARM;  Surgeon: Rosetta Posner, MD;  Location: [facility];  Service: Vascular;  Laterality: Left;   CATARACT EXTRACTION W/PHACO Right 03/14/2015   Procedure: CATARACT EXTRACTION PHACO AND INTRAOCULAR LENS PLACEMENT (IOC);  Surgeon: Baruch Goldmann, MD;  Location: [facility];  Service: Ophthalmology;  Laterality: Right;  CDE:11.13   CATARACT EXTRACTION W/PHACO Left 04/11/2015   Procedure: CATARACT EXTRACTION PHACO AND INTRAOCULAR LENS PLACEMENT LEFT EYE CDE=9.68;  Surgeon: Baruch Goldmann, MD;  Location: [facility];   Service: Ophthalmology;  Laterality: Left;   COLONOSCOPY  02/24/2010   cystoscopy with ureteral stent  Bilateral 10/07/2017   At [place] CV LINE RIGHT  11/17/2017   IR FLUORO GUIDE CV LINE RIGHT  11/22/2017   IR REMOVAL TUN CV CATH W/O FL  01/26/2018   IR US GUIDE VASC ACCESS RIGHT  11/17/2017   MULTIPLE EXTRACTIONS WITH ALVEOLOPLASTY N/A 06/13/2012   Procedure: MULTIPLE EXTRACION WITH ALVEOLOPLASTY EXTRACT: 18, 19, 20, 21, 22, 24, 25, 27, 28, 29, 30, 31;  Surgeon: Gae Bon, DDS;  Location: [facility];  Service: Oral Surgery;  Laterality: N/A;   ORIF HUMERUS FRACTURE Right 04/20/2019   Procedure: OPEN REDUCTION INTERNAL FIXATION (ORIF) PROXIMAL HUMERUS FRACTURE;  Surgeon: Marchia Bond, MD;  Location: [facility];  Service: Orthopedics;  Laterality: Right;   TUBAL LIGATION     URETERAL STENT PLACEMENT  09/2017    Current Outpatient Rx   Order #: [id number] Class: Historical Med   Order #: [id number] Class: Historical Med   Order #: [id number] Class: No Print   Order #: [id number] Class: Historical Med   Order #: [id number] Class: Print   Order #: [id number] Class: Historical Med   Order #: [id number] Class: Historical Med   Order #: [id number] Class: Historical Med   Order #: [id number] Class: Historical Med   Order #: [id number] Class: No Print   Order #: [id number] Class: Historical Med   Order #: [id number] Class: Historical Med   Order #: [id number] Class: Historical Med   Order #: [id number] Class: Historical Med   Order #: [id number] Class: Historical Med   Order #: [id number] Class: Historical Med   Order #: [id number] Class: Print   Order #: [id number] Class: Historical Med   Order #: [id number] Class: Historical Med   Order #: [id number] Class: Historical Med   Order #: [id number] Class: Normal   Order #: [id number] Class: Historical Med    Allergies Aspirin  Family History  Problem Relation Age of Onset   Diabetes Sister    Chronic Renal Failure Neg Hx      Social History Social History   Tobacco Use   Smoking status: Never Smoker   Smokeless tobacco: Current User    Types: Snuff  Substance Use Topics   Alcohol use: Not Currently    Alcohol/week: 1.0 standard drinks    Types: 1 Cans of beer per week   Drug use: No    Review of Systems  All other systems negative except as documented in the HPI. All pertinent positives and negatives as reviewed in the HPI. ____________________________________________   PHYSICAL EXAM:  VITAL SIGNS: ED Triage Vitals  Enc Vitals Group     BP 05/24/19 0010 (!) 164/72     Pulse Rate 05/24/19 0010 80     Resp 05/24/19 0015 (!) 30     Temp --      Temp src --      SpO2 05/24/19 0010 99 %     Weight 05/24/19 0012 96 lb (43.5 kg)     Height 05/24/19 0012 5' (1.524 m)    Constitutional: Alert and oriented. Well appearing and in no acute  distress. Eyes: Conjunctivae are normal. PERRL. EOMI. Head: Atraumatic. Nose: No congestion/rhinnorhea. Mouth/Throat: Mucous membranes are moist.  Oropharynx non-erythematous. Neck: No stridor.  No meningeal signs.   Cardiovascular: Normal rate, regular rhythm. Good peripheral circulation. Grossly normal heart sounds.   Respiratory: Normal respiratory effort.  No retractions. Lungs CTAB. Gastrointestinal: Soft and nontender. No distention.  Musculoskeletal: No lower extremity tenderness nor edema. No gross deformities of extremities. Neurologic:  Normal speech and language. No gross focal neurologic deficits are appreciated.  Skin:  Skin is warm, dry and intact. No rash noted.  ____________________________________________   LABS (all labs ordered are listed, but only abnormal results are displayed)  Labs Reviewed  CBC WITH DIFFERENTIAL/PLATELET - Abnormal; Notable for the following components:      Result Value   RBC 3.11 (*)    Hemoglobin 11.2 (*)    HCT 33.8 (*)    MCV 108.7 (*)    MCH 36.0 (*)    Platelets 142 (*)    All other components  within normal limits  COMPREHENSIVE METABOLIC PANEL - Abnormal; Notable for the following components:   Potassium 3.1 (*)    Chloride 97 (*)    Glucose, Bld 234 (*)    Creatinine, Ser 3.36 (*)    Calcium 8.1 (*)    Total Protein 6.2 (*)    Albumin 3.1 (*)    AST 94 (*)    ALT 65 (*)    Alkaline Phosphatase 136 (*)    GFR calc non Af Amer 14 (*)    GFR calc Af Amer 16 (*)    All other components within normal limits  AMMONIA - Abnormal; Notable for the following components:   Ammonia 54 (*)    All other components within normal limits  TROPONIN I (HIGH SENSITIVITY) - Abnormal; Notable for the following components:   Troponin I (High Sensitivity) 19 (*)    All other components within normal limits  MAGNESIUM  URINALYSIS, ROUTINE W REFLEX MICROSCOPIC   ____________________________________________  EKG   EKG Interpretation  Date/Time:  Wednesday May 24 2019 00:17:20 EDT Ventricular Rate:  79 PR Interval:    QRS Duration: 88 QT Interval:  446 QTC Calculation: 512 R Axis:   -5 Text Interpretation: Sinus rhythm Probable left atrial enlargement Probable anterior infarct, age indeterminate Prolonged QT interval Baseline wander in lead(s) V3 No significant change since last tracing Confirmed by Merrily Pew [phone]) on 05/24/2019 12:53:21 AM       ____________________________________________  RADIOLOGY  DG Chest Portable 1 View  Result Date: 05/24/2019 CLINICAL DATA:  Weakness. EXAM: PORTABLE CHEST 1 VIEW COMPARISON:  04/08/2019 FINDINGS: Cardiac silhouette is enlarged but stable from prior study. Aortic calcifications are noted. There is no pneumothorax. No large pleural effusion. No focal infiltrate. There is some atelectasis at the left lung base. IMPRESSION: No active disease. Electronically Signed   By: Constance Holster M.D.   On: 05/24/2019 01:56    ____________________________________________   PROCEDURES  Procedure(s) performed:    Procedures   ____________________________________________   INITIAL IMPRESSION / ASSESSMENT AND PLAN / ED COURSE  Generalized weakness without readily apparent cause. Will do broad workup. Started after dialysis, could be related to hypovolemia?   Workup as above. Improved after fluids. No focal weakness to suggest stroke. No indications for admission. No indications for further imaging. Stable for dc to care of daughter.   Pertinent labs & imaging results that were available during my care of the patient were reviewed by me and considered  in my medical decision making (see chart for details).   A medical screening exam was performed and I feel the patient has had an appropriate workup for their chief complaint at this time and likelihood of emergent condition existing is low. They have been counseled on decision, discharge, follow up and which symptoms necessitate immediate return to the emergency department. They or their family verbally stated understanding and agreement with plan and discharged in stable condition.   ____________________________________________  FINAL CLINICAL IMPRESSION(S) / ED DIAGNOSES  Final diagnoses:  Weakness  Dehydration     MEDICATIONS GIVEN DURING THIS VISIT:  Medications  lactated ringers bolus 500 mL (0 mLs Intravenous Stopped 05/24/19 0306)     NEW OUTPATIENT MEDICATIONS STARTED DURING THIS VISIT:  New Prescriptions   No medications on file    Note:  This note was prepared with assistance of Dragon voice recognition software. Occasional wrong-word or sound-a-like substitutions may have occurred due to the inherent limitations of voice recognition software.   Makynzi Eastland, Corene Cornea, MD 05/24/19 [phone]

## 2019-05-24 NOTE — ED Triage Notes (Signed)
Pt arrived by [facility] for weakness, when ems arrived pt daughter stated pt was "not acting right, and is sluggish." pt had dialysis today and daughter noticed her this way after dialysis.

## 2019-06-04 ENCOUNTER — Emergency Department (HOSPITAL_COMMUNITY)
Admission: EM | Admit: 2019-06-04 | Discharge: 2019-06-04 | Disposition: A | Discharge status: Home / Self Care | Payer: Medicaid Other | Attending: Emergency Medicine | Admitting: Emergency Medicine

## 2019-06-04 ENCOUNTER — Encounter (HOSPITAL_COMMUNITY): Payer: Self-pay | Admitting: Emergency Medicine

## 2019-06-04 ENCOUNTER — Other Ambulatory Visit: Payer: Self-pay

## 2019-06-04 ENCOUNTER — Emergency Department (HOSPITAL_COMMUNITY): Payer: Medicaid Other

## 2019-06-04 DIAGNOSIS — E1122 Type 2 diabetes mellitus with diabetic chronic kidney disease: Secondary | ICD-10-CM | POA: Insufficient documentation

## 2019-06-04 DIAGNOSIS — Z79899 Other long term (current) drug therapy: Secondary | ICD-10-CM | POA: Diagnosis not present

## 2019-06-04 DIAGNOSIS — M5442 Lumbago with sciatica, left side: Secondary | ICD-10-CM | POA: Diagnosis not present

## 2019-06-04 DIAGNOSIS — I5032 Chronic diastolic (congestive) heart failure: Secondary | ICD-10-CM | POA: Diagnosis not present

## 2019-06-04 DIAGNOSIS — I132 Hypertensive heart and chronic kidney disease with heart failure and with stage 5 chronic kidney disease, or end stage renal disease: Secondary | ICD-10-CM | POA: Insufficient documentation

## 2019-06-04 DIAGNOSIS — N186 End stage renal disease: Secondary | ICD-10-CM | POA: Insufficient documentation

## 2019-06-04 DIAGNOSIS — Z794 Long term (current) use of insulin: Secondary | ICD-10-CM | POA: Diagnosis not present

## 2019-06-04 DIAGNOSIS — G8929 Other chronic pain: Secondary | ICD-10-CM | POA: Insufficient documentation

## 2019-06-04 DIAGNOSIS — Z992 Dependence on renal dialysis: Secondary | ICD-10-CM | POA: Diagnosis not present

## 2019-06-04 DIAGNOSIS — R252 Cramp and spasm: Secondary | ICD-10-CM | POA: Diagnosis present

## 2019-06-04 LAB — CBC
HCT: 36.7 % (ref 36.0–46.0)
Hemoglobin: 11.6 g/dL — ABNORMAL LOW (ref 12.0–15.0)
MCH: 35 pg — ABNORMAL HIGH (ref 26.0–34.0)
MCHC: 31.6 g/dL (ref 30.0–36.0)
MCV: 110.9 fL — ABNORMAL HIGH (ref 80.0–100.0)
Platelets: 167 10*3/uL (ref 150–400)
RBC: 3.31 MIL/uL — ABNORMAL LOW (ref 3.87–5.11)
RDW: 12.1 % (ref 11.5–15.5)
WBC: 8.1 10*3/uL (ref 4.0–10.5)
nRBC: 0 % (ref 0.0–0.2)

## 2019-06-04 LAB — BASIC METABOLIC PANEL
Anion gap: 17 — ABNORMAL HIGH (ref 5–15)
BUN: 56 mg/dL — ABNORMAL HIGH (ref 8–23)
CO2: 21 mmol/L — ABNORMAL LOW (ref 22–32)
Calcium: 7.9 mg/dL — ABNORMAL LOW (ref 8.9–10.3)
Chloride: 96 mmol/L — ABNORMAL LOW (ref 98–111)
Creatinine, Ser: 5.35 mg/dL — ABNORMAL HIGH (ref 0.44–1.00)
GFR calc Af Amer: 9 mL/min — ABNORMAL LOW (ref >60)
GFR calc non Af Amer: 8 mL/min — ABNORMAL LOW (ref >60)
Glucose, Bld: 119 mg/dL — ABNORMAL HIGH (ref 70–99)
Potassium: 4.9 mmol/L (ref 3.5–5.1)
Sodium: 134 mmol/L — ABNORMAL LOW (ref 135–145)

## 2019-06-04 LAB — MAGNESIUM: Magnesium: 2.4 mg/dL (ref 1.7–2.4)

## 2019-06-04 LAB — PHOSPHORUS: Phosphorus: 9.4 mg/dL — ABNORMAL HIGH (ref 2.5–4.6)

## 2019-06-04 NOTE — ED Provider Notes (Signed)
[facility] EMERGENCY DEPARTMENT Provider Note   CSN: [id number] Arrival date & time: 06/04/19  1844     History No chief complaint on file.   Erica [name] is a 64 y.o. female w/ ESRD on dialysis Tues, Thurs, Sat, presenting to the ED with left leg spasms.  The patient reports her left leg was "shaking hard" at home earlier, and she was having back pain radiating down her left leg with this.  She cannot provide much additional history, and tells me "My memory is really bad, you'll have to call my daughter."  I did speak to her daughter Erica [name] and her son Toribio [name] together on the phone.  They tell me that their mother missed her dialysis on Saturday (yesterday) because she was feeling unwell. Specifically, she was complaining of pain and shaking in her left leg and her lower back.  This has been an ongoing issue for her.  Erica [name] tells me she was concerned her mother had a shaking episode this evening and wondered if this was a seizure.  There was no LOC involved.  The patient has been compliant with her Infitab and her Keppra at home, and has a history of seizure prior to this.    The children report no other specific concerns.  They deny fevers, chills, or patient complaints of SOB at home.  She has not missed any other dialysis.  Per chart review, the patient has been seen in the ED for weakness before, most recently on 05/24/19, when she was given fluids and discharged back home.  HPI     Past Medical History:  Diagnosis Date  . Alcohol-induced pancreatitis   . Chronic diarrhea   . Depression   . Diabetes mellitus    fasting blood sugar 110-120s  . Diastolic CHF ([place])   . DKA (diabetic ketoacidoses) ([place])   . Gastroparesis   . GERD (gastroesophageal reflux disease)   . Heart murmur   . History of kidney stones   . Hyperlipidemia   . Hypertension   . Hypokalemia   . Muscle spasm   . Neuropathic pain   . Neuropathy    Hx: of  . Pyelonephritis   . Seizures  ([place])   . Vitamin B12 deficiency   . Vitamin D deficiency     Patient Active Problem List   Diagnosis Date Noted  . Closed fracture of right proximal humerus 04/20/2019  . Surgery, elective   . Seizure ([place]) 11/12/2018  . History of hydronephrosis --stents in place 12/08/2017  . Stroke ([place]) 11/29/2017  . Seizures ([place])   . Hydronephrosis   . Recurrent UTI   . Diabetes mellitus type 2 in nonobese (HCC)   . ESRD (end stage renal disease) ([place])   . Anemia of chronic disease   . Chronic diastolic congestive heart failure ([place])   . Essential hypertension   . PICC (peripherally inserted central catheter) in place   . Acute pyelonephritis 11/12/2017  . Uncontrolled type 2 diabetes mellitus with hyperglycemia, with long-term current use of insulin ([place]) 11/10/2017  . Renal failure 11/08/2017  . Seizure disorder ([place]) 11/08/2017  . Orthostatic hypotension 07/25/2014  . Moderate protein malnutrition ([place]) 09/15/2013  . Aspiration pneumonia ([place]) 09/15/2013  . Acute respiratory failure requiring reintubation ([place]) 09/11/2013  . probable Seizures due to metabolic disorder 81/85/6314  . Lactic acidosis 03/19/2013  . Abdominal pain 03/19/2013  . Rotavirus infection 10/29/2012  . Type II or unspecified type diabetes mellitus without mention of complication,  uncontrolled 10/29/2012  . Protein-calorie malnutrition, severe ([place]) 10/27/2012  . NSTEMI (non-ST elevated myocardial infarction) ([place]) 10/26/2012  . Fever, unspecified 10/26/2012  . Hypotension 10/25/2012  . Metabolic acidosis 89/38/1017  . Chronic diarrhea 10/25/2012  . Tobacco abuse 10/25/2012  . DKA (diabetic ketoacidoses) ([place]) 09/09/2012  . Dehydration 09/09/2012  . DKA, type 2 ([place]) 05/20/2012  . Abnormal LFTs 05/20/2012  . Heart murmur, systolic 51/03/5850  . Hypoglycemia 07/08/2011  . Metabolic encephalopathy 77/82/4235  . Alcohol abuse 07/08/2011  . Hypokalemia 07/08/2011  . Nausea & vomiting 07/08/2011  . H/O  chronic pancreatitis 07/08/2011    Past Surgical History:  Procedure Laterality Date  . AV FISTULA PLACEMENT Left 12/15/2017   Procedure: INSERTION OF ARTERIOVENOUS (AV) GORE-TEX GRAFT ARM;  Surgeon: Rosetta Posner, MD;  Location: [place];  Service: Vascular;  Laterality: Left;  . CATARACT EXTRACTION W/PHACO Right 03/14/2015   Procedure: CATARACT EXTRACTION PHACO AND INTRAOCULAR LENS PLACEMENT (IOC);  Surgeon: Baruch Goldmann, MD;  Location: [facility];  Service: Ophthalmology;  Laterality: Right;  CDE:11.13  . CATARACT EXTRACTION W/PHACO Left 04/11/2015   Procedure: CATARACT EXTRACTION PHACO AND INTRAOCULAR LENS PLACEMENT LEFT EYE CDE=9.68;  Surgeon: Baruch Goldmann, MD;  Location: [facility];  Service: Ophthalmology;  Laterality: Left;  . COLONOSCOPY  02/24/2010  . cystoscopy with ureteral stent  Bilateral 10/07/2017   At [place] CV LINE RIGHT  11/17/2017  . IR FLUORO GUIDE CV LINE RIGHT  11/22/2017  . IR REMOVAL TUN CV CATH W/O FL  01/26/2018  . IR US GUIDE VASC ACCESS RIGHT  11/17/2017  . MULTIPLE EXTRACTIONS WITH ALVEOLOPLASTY N/A 06/13/2012   Procedure: MULTIPLE EXTRACION WITH ALVEOLOPLASTY EXTRACT: 18, 19, 20, 21, 22, 24, 25, 27, 28, 29, 30, 31;  Surgeon: Gae Bon, DDS;  Location: [facility];  Service: Oral Surgery;  Laterality: N/A;  . ORIF HUMERUS FRACTURE Right 04/20/2019   Procedure: OPEN REDUCTION INTERNAL FIXATION (ORIF) PROXIMAL HUMERUS FRACTURE;  Surgeon: Marchia Bond, MD;  Location: [facility];  Service: Orthopedics;  Laterality: Right;  . TUBAL LIGATION    . URETERAL STENT PLACEMENT  09/2017     OB History    Gravida      Para      Term      Preterm      AB      Living  2     SAB      TAB      Ectopic      Multiple      Live Births              Family History  Problem Relation Age of Onset  . Diabetes Sister   . Chronic Renal Failure Neg Hx     Social History   Tobacco Use  . Smoking status: Never Smoker  . Smokeless tobacco:  Current User    Types: Snuff  Substance Use Topics  . Alcohol use: Not Currently    Alcohol/week: 1.0 standard drinks    Types: 1 Cans of beer per week  . Drug use: No    Home Medications Prior to Admission medications   Medication Sig Start Date End Date Taking? Authorizing Provider  acetaminophen (TYLENOL) 325 MG tablet Take 650 mg by mouth every 4 (four) hours as needed for moderate pain.   Yes [provider]  albuterol (PROAIR HFA) 108 (90 Base) MCG/ACT inhaler Inhale 2 puffs into the lungs every 4 (four) hours as needed for  wheezing or shortness of breath.   Yes [provider]  carboxymethylcellulose (ARTIFICIAL TEARS) 1 % ophthalmic solution Place 1 drop into both eyes daily as needed (dry eyes).   Yes [provider]  carvedilol (COREG) 6.25 MG tablet Take 1 tablet (6.25 mg total) by mouth 2 (two) times daily. 11/15/18  Yes Buriev, Arie Sabina, MD  cinacalcet (SENSIPAR) 30 MG tablet Take 30 mg by mouth every evening.   Yes [provider]  dextrose (GLUTOSE) 40 % GEL Take 1 Tube by mouth every 10 (ten) minutes as needed for low blood sugar (unitl blood sugar is above 70).   Yes [provider]  Emollient (CETAPHIL) cream Apply 1 application topically every 12 (twelve) hours as needed (dry skin).   Yes [provider]  ferric citrate (AURYXIA) 1 GM 210 MG(Fe) tablet Take 2 tablets (420 mg total) by mouth 3 (three) times daily with meals. 11/15/18  Yes Buriev, Arie Sabina, MD  folic acid (FOLVITE) 1 MG tablet Take 1 mg by mouth every morning.   Yes [provider]  insulin lispro (HUMALOG) 100 UNIT/ML injection Inject 2-10 Units into the skin 4 (four) times daily. Per sliding scale: CBG 151-200 2 units, 201-250 4 units, 251-300 6 units, 301-350 8 units, 351-400 10 units,   Yes [provider]  levETIRAcetam (KEPPRA) 1000 MG tablet Take 1,000 mg by mouth every morning.   Yes [provider]  loperamide (IMODIUM) 2  MG capsule Take 4 mg by mouth daily as needed for diarrhea or loose stools.   Yes [provider]  phenytoin (DILANTIN INFATABS) 50 MG tablet Chew 200 mg by mouth 2 (two) times daily.    Yes [provider]  senna (SENOKOT) 8.6 MG TABS tablet Take 8.6 mg by mouth in the morning and at bedtime.   Yes [provider]  sodium bicarbonate 650 MG tablet Take 650 mg by mouth 4 (four) times daily. For indigestion - midnight, 6am, noon and 6pm   Yes [provider]  topiramate (TOPAMAX) 25 MG tablet Take 1 tablet (25 mg total) by mouth 2 (two) times daily. 11/29/17 11/12/19 Yes Arrien, Jimmy [place], MD  Vitamin D, Ergocalciferol, (DRISDOL) 1.25 MG (50000 UT) CAPS capsule Take 50,000 Units by mouth every Friday.   Yes [provider]  calcitRIOL (ROCALTROL) 0.25 MCG capsule Take 1 capsule (0.25 mcg total) by mouth Every Tuesday,Thursday,and Saturday with dialysis. 12/18/17   Love, Ivan Anchors, PA-C  LORazepam (ATIVAN) 2 MG/ML concentrated solution Take 2 mg by mouth every 6 (six) hours as needed for seizure.    [provider]    Allergies    Aspirin  Review of Systems   Review of Systems  Constitutional: Negative for chills and fever.  HENT: Negative for ear pain and sore throat.   Eyes: Negative for pain and visual disturbance.  Respiratory: Negative for cough and shortness of breath.   Cardiovascular: Negative for chest pain and palpitations.  Gastrointestinal: Negative for abdominal pain and vomiting.  Genitourinary: Negative for dysuria.  Musculoskeletal: Positive for arthralgias, back pain and myalgias.  Skin: Negative for color change and rash.  Neurological: Negative for syncope and headaches.  All other systems reviewed and are negative.   Physical Exam Updated Vital Signs BP (!) 169/89 (BP Location: Right Arm)   Pulse 82   Temp 98.4 F (36.9 C) (Oral)   Resp 17   Ht 5' (1.524 m)   Wt 43.5 kg   SpO2 95%  BMI 18.75 kg/m    Physical Exam Vitals and nursing note reviewed.  Constitutional:      General: She is not in acute distress.    Appearance: She is well-developed.  HENT:     Head: Normocephalic and atraumatic.  Eyes:     Conjunctiva/sclera: Conjunctivae normal.  Cardiovascular:     Rate and Rhythm: Normal rate and regular rhythm.     Pulses: Normal pulses.  Pulmonary:     Effort: Pulmonary effort is normal. No respiratory distress.     Breath sounds: Normal breath sounds.     Comments: 98% on room air Abdominal:     Palpations: Abdomen is soft.     Tenderness: There is no abdominal tenderness.  Musculoskeletal:     Cervical back: Neck supple.  Skin:    General: Skin is warm and dry.  Neurological:     General: No focal deficit present.     Mental Status: She is alert and oriented to person, place, and time.     Sensory: No sensory deficit.     Comments: Full ROM of the lower extremities, negative straight leg test     ED Results / Procedures / Treatments   Labs (all labs ordered are listed, but only abnormal results are displayed) Labs Reviewed  BASIC METABOLIC PANEL - Abnormal; Notable for the following components:      Result Value   Sodium 134 (*)    Chloride 96 (*)    CO2 21 (*)    Glucose, Bld 119 (*)    BUN 56 (*)    Creatinine, Ser 5.35 (*)    Calcium 7.9 (*)    GFR calc non Af Amer 8 (*)    GFR calc Af Amer 9 (*)    Anion gap 17 (*)    All other components within normal limits  CBC - Abnormal; Notable for the following components:   RBC 3.31 (*)    Hemoglobin 11.6 (*)    MCV 110.9 (*)    MCH 35.0 (*)    All other components within normal limits  PHOSPHORUS - Abnormal; Notable for the following components:   Phosphorus 9.4 (*)    All other components within normal limits  MAGNESIUM    EKG EKG Interpretation  Date/Time:  Sunday June 04 2019 18:58:07 EDT Ventricular Rate:  79 PR Interval:    QRS Duration: 78 QT Interval:  448 QTC Calculation: 514 R  Axis:   -16 Text Interpretation: Sinus rhythm Borderline left axis deviation Prolonged QT interval Baseline wander in lead(s) II III aVF No STEMI Confirmed by Octaviano Glow [id number]) on 06/04/2019 7:48:05 PM   Radiology DG Chest Portable 1 View  Result Date: 06/04/2019 CLINICAL DATA:  Weakness and muscle spasms in LEFT leg. Missed dialysis yesterday. EXAM: PORTABLE CHEST 1 VIEW COMPARISON:  Chest x-rays dated 05/24/2019 and 04/08/2019. FINDINGS: Borderline cardiomegaly is stable. Lungs are clear. No pleural effusion or pneumothorax is seen. Fixation hardware at the proximal RIGHT humerus. Osseous structures about the chest are otherwise unremarkable. IMPRESSION: No active disease. No evidence of pneumonia or pulmonary edema. Electronically Signed   By: Franki Cabot M.D.   On: 06/04/2019 20:04    Procedures Procedures (including critical care time)  Medications Ordered in ED Medications - No data to display  ED Course  I have reviewed the triage vital signs and the nursing notes.  Pertinent labs & imaging results that were available during my care of the patient were reviewed by me  and considered in my medical decision making (see chart for details).  64 yo female on dialysis here with left leg spasms and pain radiating down the back of her left leg at home.  This sounds like sciatica to me.  She does ambulate "just a bit" with a walker, but is otherwise mostly wheelchair bound.  She has a benign neurological exam otherwise, and no evidence of cord compression.  From the history reported by her daughter, I am not convinced the patient had a seizure at home, with no LOC.  It is possible there was a breakthrough episode, but she is already on double AED, and would have to discuss this with her neurologist.  There is no suggestion of status epilepticus.  No indication today for emergent dialysis based on her bloodwork and physical exam.  However, I strongly advised to the patient, her son and  daughter that she attends her next dialysis session.  I personally ordered and reviewed her lab tests, as well as her xray imaging, which does not show significant pulmonary edema. I reviewed her prior medical records including her ED visit on 05/24/19 Additional history was provided by her son and daughter by phone.  Clinical Course as of Jun 04 1200  Sun Jun 04, 2019  2313 Reassessed patient and she still has no pain or low back spasms.  I discussed with the patient's son and daughter by phone her work-up.  I believe based on her labs should be okay waiting till Tuesday for neck scheduled dialysis appointment.  She does not examine volume overloaded and her potassium is fine.  Discussed possibility of having sciatica with pain in her back that radiates down her leg when she is having her spasms.  I said there is no easy fix for this.   [MT]  2313  Advised to follow-up with her doctor about this issue.  They are in agreement this is a will come pick her up.   [MT]    Clinical Course User Index [MT] Shiquan Mathieu, Carola Rhine, MD   Final Clinical Impression(s) / ED Diagnoses Final diagnoses:  Chronic left-sided low back pain with left-sided sciatica    Rx / DC Orders ED Discharge Orders    None       Wyvonnia Dusky, MD 06/05/19 1202

## 2019-06-04 NOTE — ED Notes (Signed)
Updated pts daughter via phone at this time.

## 2019-06-04 NOTE — ED Triage Notes (Addendum)
Weakness and muscle spasms in her LT leg.  Dialysis pt missed dialysis yesterday. cbg [id number]

## 2019-07-08 DIAGNOSIS — D631 Anemia in chronic kidney disease: Secondary | ICD-10-CM

## 2019-07-09 DIAGNOSIS — E871 Hypo-osmolality and hyponatremia: Secondary | ICD-10-CM | POA: Insufficient documentation

## 2019-07-14 ENCOUNTER — Encounter (HOSPITAL_COMMUNITY): Payer: Self-pay | Admitting: *Deleted

## 2019-07-14 ENCOUNTER — Emergency Department (HOSPITAL_COMMUNITY)
Admission: EM | Admit: 2019-07-14 | Discharge: 2019-07-15 | Disposition: A | Discharge status: Home / Self Care | Payer: Medicaid Other | Attending: Emergency Medicine | Admitting: Emergency Medicine

## 2019-07-14 DIAGNOSIS — R197 Diarrhea, unspecified: Secondary | ICD-10-CM | POA: Insufficient documentation

## 2019-07-14 DIAGNOSIS — F17228 Nicotine dependence, chewing tobacco, with other nicotine-induced disorders: Secondary | ICD-10-CM | POA: Diagnosis not present

## 2019-07-14 DIAGNOSIS — N186 End stage renal disease: Secondary | ICD-10-CM | POA: Diagnosis not present

## 2019-07-14 DIAGNOSIS — E1140 Type 2 diabetes mellitus with diabetic neuropathy, unspecified: Secondary | ICD-10-CM | POA: Diagnosis not present

## 2019-07-14 DIAGNOSIS — I132 Hypertensive heart and chronic kidney disease with heart failure and with stage 5 chronic kidney disease, or end stage renal disease: Secondary | ICD-10-CM | POA: Diagnosis not present

## 2019-07-14 DIAGNOSIS — Z79899 Other long term (current) drug therapy: Secondary | ICD-10-CM | POA: Diagnosis not present

## 2019-07-14 DIAGNOSIS — E1122 Type 2 diabetes mellitus with diabetic chronic kidney disease: Secondary | ICD-10-CM | POA: Diagnosis not present

## 2019-07-14 DIAGNOSIS — Z992 Dependence on renal dialysis: Secondary | ICD-10-CM | POA: Diagnosis not present

## 2019-07-14 DIAGNOSIS — Z794 Long term (current) use of insulin: Secondary | ICD-10-CM | POA: Insufficient documentation

## 2019-07-14 DIAGNOSIS — I5032 Chronic diastolic (congestive) heart failure: Secondary | ICD-10-CM | POA: Insufficient documentation

## 2019-07-14 LAB — CBC
HCT: 39 % (ref 36.0–46.0)
Hemoglobin: 12.4 g/dL (ref 12.0–15.0)
MCH: 33.5 pg (ref 26.0–34.0)
MCHC: 31.8 g/dL (ref 30.0–36.0)
MCV: 105.4 fL — ABNORMAL HIGH (ref 80.0–100.0)
Platelets: 215 10*3/uL (ref 150–400)
RBC: 3.7 MIL/uL — ABNORMAL LOW (ref 3.87–5.11)
RDW: 12.1 % (ref 11.5–15.5)
WBC: 7.4 10*3/uL (ref 4.0–10.5)
nRBC: 0 % (ref 0.0–0.2)

## 2019-07-14 LAB — COMPREHENSIVE METABOLIC PANEL
ALT: 28 U/L (ref 0–44)
AST: 31 U/L (ref 15–41)
Albumin: 3.8 g/dL (ref 3.5–5.0)
Alkaline Phosphatase: 158 U/L — ABNORMAL HIGH (ref 38–126)
Anion gap: 9 (ref 5–15)
BUN: 16 mg/dL (ref 8–23)
CO2: 30 mmol/L (ref 22–32)
Calcium: 8.3 mg/dL — ABNORMAL LOW (ref 8.9–10.3)
Chloride: 100 mmol/L (ref 98–111)
Creatinine, Ser: 2.61 mg/dL — ABNORMAL HIGH (ref 0.44–1.00)
GFR calc Af Amer: 22 mL/min — ABNORMAL LOW (ref >60)
GFR calc non Af Amer: 19 mL/min — ABNORMAL LOW (ref >60)
Glucose, Bld: 269 mg/dL — ABNORMAL HIGH (ref 70–99)
Potassium: 2.8 mmol/L — ABNORMAL LOW (ref 3.5–5.1)
Sodium: 139 mmol/L (ref 135–145)
Total Bilirubin: 0.5 mg/dL (ref 0.3–1.2)
Total Protein: 7.7 g/dL (ref 6.5–8.1)

## 2019-07-14 LAB — LIPASE, BLOOD: Lipase: 13 U/L (ref 11–51)

## 2019-07-14 LAB — CBG MONITORING, ED: Glucose-Capillary: 180 mg/dL — ABNORMAL HIGH (ref 70–99)

## 2019-07-14 MED ORDER — POTASSIUM CHLORIDE 20 MEQ PO PACK
20.0000 meq | PACK | Freq: Once | ORAL | Status: AC
  Administered 2019-07-14: 20 meq via ORAL
  Filled 2019-07-14: qty 1

## 2019-07-14 NOTE — Discharge Instructions (Addendum)
Return to the emergency room if start having fevers or abdominal pain. Follow-up with dialysis as planned. You can take over-the-counter Imodium as needed for diarrhea.

## 2019-07-14 NOTE — ED Provider Notes (Signed)
[facility] EMERGENCY DEPARTMENT Provider Note   CSN: [id number] Arrival date & time: 07/14/19  1349     History Chief Complaint  Patient presents with  . Diarrhea    Erica [name] is a 64 y.o. female.  HPI   Pt states she started to have diarrhea yesterday.  She had about 5 episodes that started after dialysis.  Pt states since she has been waiting here it has stopped.  No further episodes.  No blood in stool.  No vomiting.  No fevers.     Past Medical History:  Diagnosis Date  . Alcohol-induced pancreatitis   . Chronic diarrhea   . Depression   . Diabetes mellitus    fasting blood sugar 110-120s  . Diastolic CHF ([place])   . DKA (diabetic ketoacidoses) ([place])   . Gastroparesis   . GERD (gastroesophageal reflux disease)   . Heart murmur   . History of kidney stones   . Hyperlipidemia   . Hypertension   . Hypokalemia   . Muscle spasm   . Neuropathic pain   . Neuropathy    Hx: of  . Pyelonephritis   . Seizures ([place])   . Vitamin B12 deficiency   . Vitamin D deficiency     Patient Active Problem List   Diagnosis Date Noted  . Closed fracture of right proximal humerus 04/20/2019  . Surgery, elective   . Seizure ([place]) 11/12/2018  . History of hydronephrosis --stents in place 12/08/2017  . Stroke ([place]) 11/29/2017  . Seizures ([place])   . Hydronephrosis   . Recurrent UTI   . Diabetes mellitus type 2 in nonobese (HCC)   . ESRD (end stage renal disease) ([place])   . Anemia of chronic disease   . Chronic diastolic congestive heart failure ([place])   . Essential hypertension   . PICC (peripherally inserted central catheter) in place   . Acute pyelonephritis 11/12/2017  . Uncontrolled type 2 diabetes mellitus with hyperglycemia, with long-term current use of insulin ([place]) 11/10/2017  . Renal failure 11/08/2017  . Seizure disorder ([place]) 11/08/2017  . Orthostatic hypotension 07/25/2014  . Moderate protein malnutrition ([place]) 09/15/2013  . Aspiration pneumonia ([place])  09/15/2013  . Acute respiratory failure requiring reintubation ([place]) 09/11/2013  . probable Seizures due to metabolic disorder 32/35/5732  . Lactic acidosis 03/19/2013  . Abdominal pain 03/19/2013  . Rotavirus infection 10/29/2012  . Type II or unspecified type diabetes mellitus without mention of complication, uncontrolled 10/29/2012  . Protein-calorie malnutrition, severe ([place]) 10/27/2012  . NSTEMI (non-ST elevated myocardial infarction) ([place]) 10/26/2012  . Fever, unspecified 10/26/2012  . Hypotension 10/25/2012  . Metabolic acidosis 20/25/4270  . Chronic diarrhea 10/25/2012  . Tobacco abuse 10/25/2012  . DKA (diabetic ketoacidoses) ([place]) 09/09/2012  . Dehydration 09/09/2012  . DKA, type 2 ([place]) 05/20/2012  . Abnormal LFTs 05/20/2012  . Heart murmur, systolic 62/37/6283  . Hypoglycemia 07/08/2011  . Metabolic encephalopathy 15/17/6160  . Alcohol abuse 07/08/2011  . Hypokalemia 07/08/2011  . Nausea & vomiting 07/08/2011  . H/O chronic pancreatitis 07/08/2011    Past Surgical History:  Procedure Laterality Date  . AV FISTULA PLACEMENT Left 12/15/2017   Procedure: INSERTION OF ARTERIOVENOUS (AV) GORE-TEX GRAFT ARM;  Surgeon: Rosetta Posner, MD;  Location: [facility];  Service: Vascular;  Laterality: Left;  . CATARACT EXTRACTION W/PHACO Right 03/14/2015   Procedure: CATARACT EXTRACTION PHACO AND INTRAOCULAR LENS PLACEMENT (IOC);  Surgeon: Baruch Goldmann, MD;  Location: [facility];  Service: Ophthalmology;  Laterality: Right;  CDE:11.13  .  CATARACT EXTRACTION W/PHACO Left 04/11/2015   Procedure: CATARACT EXTRACTION PHACO AND INTRAOCULAR LENS PLACEMENT LEFT EYE CDE=9.68;  Surgeon: Baruch Goldmann, MD;  Location: [facility];  Service: Ophthalmology;  Laterality: Left;  . COLONOSCOPY  02/24/2010  . cystoscopy with ureteral stent  Bilateral 10/07/2017   At [place] CV LINE RIGHT  11/17/2017  . IR FLUORO GUIDE CV LINE RIGHT  11/22/2017  . IR REMOVAL TUN CV CATH W/O FL   01/26/2018  . IR US GUIDE VASC ACCESS RIGHT  11/17/2017  . MULTIPLE EXTRACTIONS WITH ALVEOLOPLASTY N/A 06/13/2012   Procedure: MULTIPLE EXTRACION WITH ALVEOLOPLASTY EXTRACT: 18, 19, 20, 21, 22, 24, 25, 27, 28, 29, 30, 31;  Surgeon: Gae Bon, DDS;  Location: [facility];  Service: Oral Surgery;  Laterality: N/A;  . ORIF HUMERUS FRACTURE Right 04/20/2019   Procedure: OPEN REDUCTION INTERNAL FIXATION (ORIF) PROXIMAL HUMERUS FRACTURE;  Surgeon: Marchia Bond, MD;  Location: [facility];  Service: Orthopedics;  Laterality: Right;  . TUBAL LIGATION    . URETERAL STENT PLACEMENT  09/2017     OB History    Gravida      Para      Term      Preterm      AB      Living  2     SAB      TAB      Ectopic      Multiple      Live Births              Family History  Problem Relation Age of Onset  . Diabetes Sister   . Chronic Renal Failure Neg Hx     Social History   Tobacco Use  . Smoking status: Never Smoker  . Smokeless tobacco: Current User    Types: Snuff  Substance Use Topics  . Alcohol use: Not Currently    Alcohol/week: 1.0 standard drinks    Types: 1 Cans of beer per week  . Drug use: No    Home Medications Prior to Admission medications   Medication Sig Start Date End Date Taking? Authorizing Provider  acetaminophen (TYLENOL) 325 MG tablet Take 650 mg by mouth every 4 (four) hours as needed for moderate pain.    [provider]  albuterol (PROAIR HFA) 108 (90 Base) MCG/ACT inhaler Inhale 2 puffs into the lungs every 4 (four) hours as needed for wheezing or shortness of breath.    [provider]  calcitRIOL (ROCALTROL) 0.25 MCG capsule Take 1 capsule (0.25 mcg total) by mouth Every Tuesday,Thursday,and Saturday with dialysis. 12/18/17   Love, Ivan Anchors, PA-C  carboxymethylcellulose (ARTIFICIAL TEARS) 1 % ophthalmic solution Place 1 drop into both eyes daily as needed (dry eyes).    [provider]  carvedilol (COREG) 6.25 MG tablet Take 1  tablet (6.25 mg total) by mouth 2 (two) times daily. 11/15/18   Kinnie Feil, MD  cinacalcet (SENSIPAR) 30 MG tablet Take 30 mg by mouth every evening.    [provider]  dextrose (GLUTOSE) 40 % GEL Take 1 Tube by mouth every 10 (ten) minutes as needed for low blood sugar (unitl blood sugar is above 70).    [provider]  Emollient (CETAPHIL) cream Apply 1 application topically every 12 (twelve) hours as needed (dry skin).    [provider]  ferric citrate (AURYXIA) 1 GM 210 MG(Fe) tablet Take 2 tablets (420 mg total) by mouth 3 (three) times  daily with meals. 11/15/18   Kinnie Feil, MD  folic acid (FOLVITE) 1 MG tablet Take 1 mg by mouth every morning.    [provider]  insulin lispro (HUMALOG) 100 UNIT/ML injection Inject 2-10 Units into the skin 4 (four) times daily. Per sliding scale: CBG 151-200 2 units, 201-250 4 units, 251-300 6 units, 301-350 8 units, 351-400 10 units,    [provider]  levETIRAcetam (KEPPRA) 1000 MG tablet Take 1,000 mg by mouth every morning.    [provider]  loperamide (IMODIUM) 2 MG capsule Take 4 mg by mouth daily as needed for diarrhea or loose stools.    [provider]  LORazepam (ATIVAN) 2 MG/ML concentrated solution Take 2 mg by mouth every 6 (six) hours as needed for seizure.    [provider]  phenytoin (DILANTIN INFATABS) 50 MG tablet Chew 200 mg by mouth 2 (two) times daily.     [provider]  senna (SENOKOT) 8.6 MG TABS tablet Take 8.6 mg by mouth in the morning and at bedtime.    [provider]  sodium bicarbonate 650 MG tablet Take 650 mg by mouth 4 (four) times daily. For indigestion - midnight, 6am, noon and 6pm    [provider]  topiramate (TOPAMAX) 25 MG tablet Take 1 tablet (25 mg total) by mouth 2 (two) times daily. 11/29/17 11/12/19  Arrien, Jimmy Picket, MD  Vitamin D, Ergocalciferol, (DRISDOL) 1.25 MG (50000 UT) CAPS capsule  Take 50,000 Units by mouth every Friday.    [provider]    Allergies    Aspirin  Review of Systems   Review of Systems  All other systems reviewed and are negative.   Physical Exam Updated Vital Signs BP (!) 193/73   Pulse 73   Temp (!) 97.1 F (36.2 C)   Resp 16   SpO2 99%   Physical Exam Vitals and nursing note reviewed.  Constitutional:      General: She is not in acute distress.    Appearance: She is well-developed.  HENT:     Head: Normocephalic and atraumatic.     Right Ear: External ear normal.     Left Ear: External ear normal.  Eyes:     General: No scleral icterus.       Right eye: No discharge.        Left eye: No discharge.     Conjunctiva/sclera: Conjunctivae normal.  Neck:     Trachea: No tracheal deviation.  Cardiovascular:     Rate and Rhythm: Normal rate and regular rhythm.  Pulmonary:     Effort: Pulmonary effort is normal. No respiratory distress.     Breath sounds: Normal breath sounds. No stridor. No wheezing or rales.  Abdominal:     General: Bowel sounds are normal. There is no distension.     Palpations: Abdomen is soft.     Tenderness: There is no abdominal tenderness. There is no guarding or rebound.  Musculoskeletal:        General: No tenderness.     Cervical back: Neck supple.  Skin:    General: Skin is warm and dry.     Findings: No rash.  Neurological:     Mental Status: She is alert.     Cranial Nerves: No cranial nerve deficit (no facial droop, extraocular movements intact, no slurred speech).     Sensory: No sensory deficit.     Motor: No abnormal muscle tone or seizure activity.  Coordination: Coordination normal.     ED Results / Procedures / Treatments   Labs (all labs ordered are listed, but only abnormal results are displayed) Labs Reviewed  COMPREHENSIVE METABOLIC PANEL - Abnormal; Notable for the following components:      Result Value   Potassium 2.8 (*)    Glucose, Bld 269 (*)    Creatinine,  Ser 2.61 (*)    Calcium 8.3 (*)    Alkaline Phosphatase 158 (*)    GFR calc non Af Amer 19 (*)    GFR calc Af Amer 22 (*)    All other components within normal limits  CBC - Abnormal; Notable for the following components:   RBC 3.70 (*)    MCV 105.4 (*)    All other components within normal limits  CBG MONITORING, ED - Abnormal; Notable for the following components:   Glucose-Capillary 180 (*)    All other components within normal limits  LIPASE, BLOOD    EKG None  Radiology No results found.  Procedures Procedures (including critical care time)  Medications Ordered in ED Medications  potassium chloride (KLOR-CON) packet 20 mEq (20 mEq Oral Given 07/14/19 2110)    ED Course  I have reviewed the triage vital signs and the nursing notes.  Pertinent labs & imaging results that were available during my care of the patient were reviewed by me and considered in my medical decision making (see chart for details).  Clinical Course as of Jul 13 2309  Fri Jul 14, 2019  2042 Labs reviewed.  Hypokalemia noted.   [JK]  2043 Creatinine decreased from previous values.  CBC unremarkable.   [AV]  4098 Patient has been able to drink fluids. No recurrent vomiting or diarrhea. She is feeling well and ready for discharge.   [JK]    Clinical Course User Index [JK] Dorie Rank, MD   MDM Rules/Calculators/A&P                      Patient presents ED for evaluation of diarrhea. ED work-up is reassuring. No signs to suggest colitis, diverticulitis or obstruction. Patient's abdominal exam is benign. She was having diarrhea and was concerned about possible dehydration. Patient has been able to orally rehydrate without difficulty. Electrolytes did show hypokalemia. She was given an oral dose of potassium but will avoid any aggressive potassium replacement considering her chronic renal failure. Patient was feeling better after treatment and feels ready for discharge. Final Clinical Impression(s) /  ED Diagnoses Final diagnoses:  Diarrhea, unspecified type    Rx / DC Orders ED Discharge Orders    None       Dorie Rank, MD 07/14/19 2311

## 2019-07-14 NOTE — ED Triage Notes (Signed)
States after dialysis yesterday she started to have diarrhea and feels weak

## 2019-08-01 ENCOUNTER — Other Ambulatory Visit: Payer: Self-pay

## 2019-08-01 ENCOUNTER — Emergency Department (HOSPITAL_COMMUNITY)
Admission: EM | Admit: 2019-08-01 | Discharge: 2019-08-01 | Disposition: A | Discharge status: Home / Self Care | Payer: Medicaid Other | Attending: Emergency Medicine | Admitting: Emergency Medicine

## 2019-08-01 ENCOUNTER — Encounter (HOSPITAL_COMMUNITY): Payer: Self-pay | Admitting: Emergency Medicine

## 2019-08-01 DIAGNOSIS — N189 Chronic kidney disease, unspecified: Secondary | ICD-10-CM | POA: Insufficient documentation

## 2019-08-01 DIAGNOSIS — E1110 Type 2 diabetes mellitus with ketoacidosis without coma: Secondary | ICD-10-CM | POA: Diagnosis not present

## 2019-08-01 DIAGNOSIS — E1165 Type 2 diabetes mellitus with hyperglycemia: Secondary | ICD-10-CM | POA: Insufficient documentation

## 2019-08-01 DIAGNOSIS — Z79899 Other long term (current) drug therapy: Secondary | ICD-10-CM | POA: Insufficient documentation

## 2019-08-01 DIAGNOSIS — E11649 Type 2 diabetes mellitus with hypoglycemia without coma: Secondary | ICD-10-CM | POA: Diagnosis not present

## 2019-08-01 DIAGNOSIS — R197 Diarrhea, unspecified: Secondary | ICD-10-CM | POA: Diagnosis present

## 2019-08-01 DIAGNOSIS — I130 Hypertensive heart and chronic kidney disease with heart failure and stage 1 through stage 4 chronic kidney disease, or unspecified chronic kidney disease: Secondary | ICD-10-CM | POA: Insufficient documentation

## 2019-08-01 DIAGNOSIS — I5032 Chronic diastolic (congestive) heart failure: Secondary | ICD-10-CM | POA: Insufficient documentation

## 2019-08-01 DIAGNOSIS — Z992 Dependence on renal dialysis: Secondary | ICD-10-CM | POA: Insufficient documentation

## 2019-08-01 DIAGNOSIS — N186 End stage renal disease: Secondary | ICD-10-CM | POA: Insufficient documentation

## 2019-08-01 LAB — COMPREHENSIVE METABOLIC PANEL
ALT: 122 U/L — ABNORMAL HIGH (ref 0–44)
AST: 72 U/L — ABNORMAL HIGH (ref 15–41)
Albumin: 3.4 g/dL — ABNORMAL LOW (ref 3.5–5.0)
Alkaline Phosphatase: 175 U/L — ABNORMAL HIGH (ref 38–126)
Anion gap: 17 — ABNORMAL HIGH (ref 5–15)
BUN: 44 mg/dL — ABNORMAL HIGH (ref 8–23)
CO2: 21 mmol/L — ABNORMAL LOW (ref 22–32)
Calcium: 8.3 mg/dL — ABNORMAL LOW (ref 8.9–10.3)
Chloride: 97 mmol/L — ABNORMAL LOW (ref 98–111)
Creatinine, Ser: 4.76 mg/dL — ABNORMAL HIGH (ref 0.44–1.00)
GFR calc Af Amer: 10 mL/min — ABNORMAL LOW (ref >60)
GFR calc non Af Amer: 9 mL/min — ABNORMAL LOW (ref >60)
Glucose, Bld: 180 mg/dL — ABNORMAL HIGH (ref 70–99)
Potassium: 4 mmol/L (ref 3.5–5.1)
Sodium: 135 mmol/L (ref 135–145)
Total Bilirubin: 0.6 mg/dL (ref 0.3–1.2)
Total Protein: 6.5 g/dL (ref 6.5–8.1)

## 2019-08-01 LAB — CBC WITH DIFFERENTIAL/PLATELET
Abs Immature Granulocytes: 0.01 10*3/uL (ref 0.00–0.07)
Basophils Absolute: 0 10*3/uL (ref 0.0–0.1)
Basophils Relative: 0 %
Eosinophils Absolute: 0.2 10*3/uL (ref 0.0–0.5)
Eosinophils Relative: 3 %
HCT: 30.1 % — ABNORMAL LOW (ref 36.0–46.0)
Hemoglobin: 9.5 g/dL — ABNORMAL LOW (ref 12.0–15.0)
Immature Granulocytes: 0 %
Lymphocytes Relative: 22 %
Lymphs Abs: 1.3 10*3/uL (ref 0.7–4.0)
MCH: 33.5 pg (ref 26.0–34.0)
MCHC: 31.6 g/dL (ref 30.0–36.0)
MCV: 106 fL — ABNORMAL HIGH (ref 80.0–100.0)
Monocytes Absolute: 0.5 10*3/uL (ref 0.1–1.0)
Monocytes Relative: 8 %
Neutro Abs: 3.9 10*3/uL (ref 1.7–7.7)
Neutrophils Relative %: 67 %
Platelets: 125 10*3/uL — ABNORMAL LOW (ref 150–400)
RBC: 2.84 MIL/uL — ABNORMAL LOW (ref 3.87–5.11)
RDW: 12.4 % (ref 11.5–15.5)
WBC: 5.8 10*3/uL (ref 4.0–10.5)
nRBC: 0 % (ref 0.0–0.2)

## 2019-08-01 LAB — PHOSPHORUS: Phosphorus: 5.7 mg/dL — ABNORMAL HIGH (ref 2.5–4.6)

## 2019-08-01 LAB — MAGNESIUM: Magnesium: 2.3 mg/dL (ref 1.7–2.4)

## 2019-08-01 LAB — LIPASE, BLOOD: Lipase: 12 U/L (ref 11–51)

## 2019-08-01 NOTE — ED Triage Notes (Signed)
Pt has diarrhea for last 2 days.  Dialysis held today d/t possible hydration.  CBG 202.

## 2019-08-01 NOTE — ED Notes (Signed)
Lab at bedside

## 2019-08-01 NOTE — ED Provider Notes (Signed)
[facility] EMERGENCY DEPARTMENT Provider Note   CSN: [id number] Arrival date & time: 08/01/19  1320     History Chief Complaint  Patient presents with  . Diarrhea    Erica [name] is a 64 y.o. female with PMH significant for IDDM, ESRD, and chronic diarrhea who presents the ED with a 2-day history of diarrhea.  Patient was brought to the ER via EMS from her dialysis in [place], [place].  She states that she gets dialyzed T/Th/Sa however her dialysis was not initiated today out of concern for possible dehydration.  She states that she has had roughly 5 episodes of nonbloody loose stools each of the past 2 days is also complaining of fatigue symptoms.  She also endorses some mild epigastric discomfort.  She denies any fevers or chills, nausea or vomiting, chest pain or shortness of breath, new numbness or weakness, melena, hematochezia, or other symptoms.  She only makes a little bit of urine occasionally.  Patient could not tell me who her nephrologist was but that she is dialyzed in [place], [place].    HPI     Past Medical History:  Diagnosis Date  . Alcohol-induced pancreatitis   . Chronic diarrhea   . Depression   . Diabetes mellitus    fasting blood sugar 110-120s  . Diastolic CHF ([place])   . DKA (diabetic ketoacidoses) ([place])   . Gastroparesis   . GERD (gastroesophageal reflux disease)   . Heart murmur   . History of kidney stones   . Hyperlipidemia   . Hypertension   . Hypokalemia   . Muscle spasm   . Neuropathic pain   . Neuropathy    Hx: of  . Pyelonephritis   . Seizures ([place])   . Vitamin B12 deficiency   . Vitamin D deficiency     Patient Active Problem List   Diagnosis Date Noted  . Closed fracture of right proximal humerus 04/20/2019  . Surgery, elective   . Seizure ([place]) 11/12/2018  . History of hydronephrosis --stents in place 12/08/2017  . Stroke ([place]) 11/29/2017  . Seizures ([place])   . Hydronephrosis   . Recurrent UTI   . Diabetes mellitus type 2 in nonobese  (HCC)   . ESRD (end stage renal disease) ([place])   . Anemia of chronic disease   . Chronic diastolic congestive heart failure ([place])   . Essential hypertension   . PICC (peripherally inserted central catheter) in place   . Acute pyelonephritis 11/12/2017  . Uncontrolled type 2 diabetes mellitus with hyperglycemia, with long-term current use of insulin ([place]) 11/10/2017  . Renal failure 11/08/2017  . Seizure disorder ([place]) 11/08/2017  . Orthostatic hypotension 07/25/2014  . Moderate protein malnutrition ([place]) 09/15/2013  . Aspiration pneumonia ([place]) 09/15/2013  . Acute respiratory failure requiring reintubation ([place]) 09/11/2013  . probable Seizures due to metabolic disorder 85/63/1497  . Lactic acidosis 03/19/2013  . Abdominal pain 03/19/2013  . Rotavirus infection 10/29/2012  . Type II or unspecified type diabetes mellitus without mention of complication, uncontrolled 10/29/2012  . Protein-calorie malnutrition, severe ([place]) 10/27/2012  . NSTEMI (non-ST elevated myocardial infarction) ([place]) 10/26/2012  . Fever, unspecified 10/26/2012  . Hypotension 10/25/2012  . Metabolic acidosis 02/63/7858  . Chronic diarrhea 10/25/2012  . Tobacco abuse 10/25/2012  . DKA (diabetic ketoacidoses) ([place]) 09/09/2012  . Dehydration 09/09/2012  . DKA, type 2 ([place]) 05/20/2012  . Abnormal LFTs 05/20/2012  . Heart murmur, systolic 85/03/7739  . Hypoglycemia 07/08/2011  . Metabolic encephalopathy 28/78/6767  . Alcohol  abuse 07/08/2011  . Hypokalemia 07/08/2011  . Nausea & vomiting 07/08/2011  . H/O chronic pancreatitis 07/08/2011    Past Surgical History:  Procedure Laterality Date  . AV FISTULA PLACEMENT Left 12/15/2017   Procedure: INSERTION OF ARTERIOVENOUS (AV) GORE-TEX GRAFT ARM;  Surgeon: Rosetta Posner, MD;  Location: [facility];  Service: Vascular;  Laterality: Left;  . CATARACT EXTRACTION W/PHACO Right 03/14/2015   Procedure: CATARACT EXTRACTION PHACO AND INTRAOCULAR LENS PLACEMENT (IOC);  Surgeon:  Baruch Goldmann, MD;  Location: [facility];  Service: Ophthalmology;  Laterality: Right;  CDE:11.13  . CATARACT EXTRACTION W/PHACO Left 04/11/2015   Procedure: CATARACT EXTRACTION PHACO AND INTRAOCULAR LENS PLACEMENT LEFT EYE CDE=9.68;  Surgeon: Baruch Goldmann, MD;  Location: [facility];  Service: Ophthalmology;  Laterality: Left;  . COLONOSCOPY  02/24/2010  . cystoscopy with ureteral stent  Bilateral 10/07/2017   At [place] CV LINE RIGHT  11/17/2017  . IR FLUORO GUIDE CV LINE RIGHT  11/22/2017  . IR REMOVAL TUN CV CATH W/O FL  01/26/2018  . IR US GUIDE VASC ACCESS RIGHT  11/17/2017  . MULTIPLE EXTRACTIONS WITH ALVEOLOPLASTY N/A 06/13/2012   Procedure: MULTIPLE EXTRACION WITH ALVEOLOPLASTY EXTRACT: 18, 19, 20, 21, 22, 24, 25, 27, 28, 29, 30, 31;  Surgeon: Gae Bon, DDS;  Location: [facility];  Service: Oral Surgery;  Laterality: N/A;  . ORIF HUMERUS FRACTURE Right 04/20/2019   Procedure: OPEN REDUCTION INTERNAL FIXATION (ORIF) PROXIMAL HUMERUS FRACTURE;  Surgeon: Marchia Bond, MD;  Location: [facility];  Service: Orthopedics;  Laterality: Right;  . TUBAL LIGATION    . URETERAL STENT PLACEMENT  09/2017     OB History    Gravida      Para      Term      Preterm      AB      Living  2     SAB      TAB      Ectopic      Multiple      Live Births              Family History  Problem Relation Age of Onset  . Diabetes Sister   . Chronic Renal Failure Neg Hx     Social History   Tobacco Use  . Smoking status: Never Smoker  . Smokeless tobacco: Current User    Types: Snuff  Vaping Use  . Vaping Use: Never used  Substance Use Topics  . Alcohol use: Not Currently    Alcohol/week: 1.0 standard drink    Types: 1 Cans of beer per week  . Drug use: No    Home Medications Prior to Admission medications   Medication Sig Start Date End Date Taking? Authorizing Provider  acetaminophen (TYLENOL) 325 MG tablet Take 650 mg by mouth every 4 (four) hours as  needed for moderate pain.   Yes [provider]  albuterol (PROAIR HFA) 108 (90 Base) MCG/ACT inhaler Inhale 2 puffs into the lungs every 4 (four) hours as needed for wheezing or shortness of breath.   Yes [provider]  calcitRIOL (ROCALTROL) 0.25 MCG capsule Take 1 capsule (0.25 mcg total) by mouth Every Tuesday,Thursday,and Saturday with dialysis. 12/18/17  Yes Love, Ivan Anchors, PA-C  carboxymethylcellulose (ARTIFICIAL TEARS) 1 % ophthalmic solution Place 1 drop into both eyes daily as needed (dry eyes).   Yes [provider]  carvedilol (COREG) 6.25 MG tablet Take 1 tablet (6.25 mg total)  by mouth 2 (two) times daily. 11/15/18  Yes Buriev, Arie Sabina, MD  cinacalcet (SENSIPAR) 30 MG tablet Take 30 mg by mouth every evening.   Yes [provider]  dextrose (GLUTOSE) 40 % GEL Take 1 Tube by mouth every 10 (ten) minutes as needed for low blood sugar (unitl blood sugar is above 70).   Yes [provider]  Emollient (CETAPHIL) cream Apply 1 application topically every 12 (twelve) hours as needed (dry skin).   Yes [provider]  ferric citrate (AURYXIA) 1 GM 210 MG(Fe) tablet Take 2 tablets (420 mg total) by mouth 3 (three) times daily with meals. 11/15/18  Yes Buriev, Arie Sabina, MD  folic acid (FOLVITE) 1 MG tablet Take 1 mg by mouth every morning.   Yes [provider]  insulin lispro (HUMALOG) 100 UNIT/ML injection Inject 2-10 Units into the skin 4 (four) times daily. Per sliding scale: CBG 151-200 2 units, 201-250 4 units, 251-300 6 units, 301-350 8 units, 351-400 10 units,   Yes [provider]  levETIRAcetam (KEPPRA) 1000 MG tablet Take 1,000 mg by mouth every morning.   Yes [provider]  loperamide (IMODIUM) 2 MG capsule Take 4 mg by mouth daily as needed for diarrhea or loose stools.   Yes [provider]  NIFEdipine (PROCARDIA-XL/NIFEDICAL-XL) 30 MG 24 hr tablet Take 30 mg by mouth every morning. 07/10/19   Yes [provider]  phenytoin (DILANTIN INFATABS) 50 MG tablet Chew 200 mg by mouth 2 (two) times daily.    Yes [provider]  rOPINIRole (REQUIP) 1 MG tablet Take 1 mg by mouth 3 (three) times daily.   Yes [provider]  senna (SENOKOT) 8.6 MG TABS tablet Take 8.6 mg by mouth in the morning and at bedtime.   Yes [provider]  sodium bicarbonate 650 MG tablet Take 650 mg by mouth 4 (four) times daily. For indigestion - midnight, 6am, noon and 6pm   Yes [provider]  topiramate (TOPAMAX) 25 MG tablet Take 1 tablet (25 mg total) by mouth 2 (two) times daily. 11/29/17 11/12/19 Yes Arrien, Jimmy Picket, MD  Vitamin D, Ergocalciferol, (DRISDOL) 1.25 MG (50000 UT) CAPS capsule Take 50,000 Units by mouth every Friday.   Yes [provider]  LORazepam (ATIVAN) 2 MG/ML concentrated solution Take 2 mg by mouth every 6 (six) hours as needed for seizure. Patient not taking: Reported on 08/01/2019    [provider]    Allergies    Aspirin  Review of Systems   Review of Systems  All other systems reviewed and are negative.   Physical Exam Updated Vital Signs BP (!) 188/75   Pulse 76   Temp 98.3 F (36.8 C) (Oral)   Resp 15   Ht 4\' 11"  (1.499 m)   Wt 43.5 kg   SpO2 95%   BMI 19.39 kg/m   Physical Exam Vitals and nursing note reviewed. Exam conducted with a chaperone present.  Constitutional:      Comments: Weak appearing.  HENT:     Head: Normocephalic and atraumatic.  Eyes:     General: No scleral icterus.    Conjunctiva/sclera: Conjunctivae normal.  Cardiovascular:     Rate and Rhythm: Normal rate and regular rhythm.     Pulses: Normal pulses.  Pulmonary:     Effort: Pulmonary effort is normal. No respiratory distress.     Breath sounds: Normal breath sounds.  Abdominal:     Comments: Soft, nondistended.  Mild TTP in epigastrium.  No TTP elsewhere.  No overlying skin changes.  Musculoskeletal:      Cervical back: Normal range of motion. No rigidity.  Skin:    General: Skin is dry.     Capillary Refill: Capillary refill takes less than 2 seconds.  Neurological:     Mental Status: She is alert and oriented to person, place, and time.     GCS: GCS eye subscore is 4. GCS verbal subscore is 5. GCS motor subscore is 6.  Psychiatric:        Mood and Affect: Mood normal.        Behavior: Behavior normal.        Thought Content: Thought content normal.     ED Results / Procedures / Treatments   Labs (all labs ordered are listed, but only abnormal results are displayed) Labs Reviewed  CBC WITH DIFFERENTIAL/PLATELET - Abnormal; Notable for the following components:      Result Value   RBC 2.84 (*)    Hemoglobin 9.5 (*)    HCT 30.1 (*)    MCV 106.0 (*)    Platelets 125 (*)    All other components within normal limits  COMPREHENSIVE METABOLIC PANEL - Abnormal; Notable for the following components:   Chloride 97 (*)    CO2 21 (*)    Glucose, Bld 180 (*)    BUN 44 (*)    Creatinine, Ser 4.76 (*)    Calcium 8.3 (*)    Albumin 3.4 (*)    AST 72 (*)    ALT 122 (*)    Alkaline Phosphatase 175 (*)    GFR calc non Af Amer 9 (*)    GFR calc Af Amer 10 (*)    Anion gap 17 (*)    All other components within normal limits  PHOSPHORUS - Abnormal; Notable for the following components:   Phosphorus 5.7 (*)    All other components within normal limits  MAGNESIUM  LIPASE, BLOOD    EKG EKG Interpretation  Date/Time:  Tuesday August 01 2019 14:30:21 EDT Ventricular Rate:  76 PR Interval:    QRS Duration: 76 QT Interval:  437 QTC Calculation: 492 R Axis:   12 Text Interpretation: Sinus rhythm Borderline low voltage, extremity leads Borderline prolonged QT interval No acute changes No significant change since last tracing Confirmed by Varney Biles [phone]) on 08/01/2019 2:39:36 PM   Radiology No results found.  Procedures Procedures (including critical care time)  Medications  Ordered in ED Medications - No data to display  ED Course  I have reviewed the triage vital signs and the nursing notes.  Pertinent labs & imaging results that were available during my care of the patient were reviewed by me and considered in my medical decision making (see chart for details).    MDM Rules/Calculators/A&P                          I reviewed patient's laboratory work-up which was largely unremarkable.   Labs: CBC with differential: No leukocytosis concerning for infection.  Mild anemia with hemoglobin at 9.5, however not significantly lower than baseline.  She denies any melena. CMP: Transaminitis, CKD, and mildly elevated anion gap consistent with her baseline.  No significant electrolyte derangement. Magnesium: WNL. Lipase: WNL. Phosphorus: Elevated, but improved from baseline.  EKG: No significant change from prior tracings.  On my examination, she is not in any acute distress.  Her physical exam  is largely benign I do not feel as though CT imaging warranted at this time.  Will have secretary call [facility] to reschedule patient for her regular dialysis.  Do not feel as though emergent dialysis is warranted.  While they were concerned about dehydration, she is neither tachycardic nor hypotensive here in the ED.  She is hemodynamically stable and appearing to be in no acute distress.  Obviously do not want to resuscitate aggressively with IVF given her ESRD.    The secretary called and spoke with the dialysis center who stated that they would not be able to emergently work her in today as they are gone for the day, but would be able to accommodate her between 11:45 AM and noon tomorrow if she is able to find her way there.  I discussed this with the patient and she is agreeable to the plan.  She has not had any episodes of loose stools while here in the ED.  Encouraging her to continue replacing fluids loss through diarrhea with p.o. hydration.  She also may  continue with loperamide.  Discussed with Dr. Kathrynn Humble who personally evaluated patient and is in agreement with assessment plan.'  Strict ED return precautions discussed.  All of the evaluation and work-up results were discussed with the patient and any family at bedside. They were provided opportunity to ask any additional questions and have none at this time. They have expressed understanding of verbal discharge instructions as well as return precautions and are agreeable to the plan.    Final Clinical Impression(s) / ED Diagnoses Final diagnoses:  ESRD (end stage renal disease) on dialysis ([place])  Diarrhea, unspecified type    Rx / DC Orders ED Discharge Orders    None       Corena Herter, PA-C 08/01/19 1650    Varney Biles, MD 08/02/19 1011

## 2019-08-01 NOTE — Discharge Instructions (Signed)
Please go to your [facility] center tomorrow between 11:45 AM and noon so that they can work you in for your dialysis.  Your laboratory work-up today is reassuring.  Please continue to replace fluids lost through diarrhea with oral hydration.  Continue take your loperamide, as needed.  Return to the ED or seek immediate medical attention should you experience any new or worsening symptoms.

## 2019-08-10 ENCOUNTER — Emergency Department (HOSPITAL_COMMUNITY)
Admission: EM | Admit: 2019-08-10 | Discharge: 2019-08-10 | Disposition: A | Discharge status: Home / Self Care | Payer: Medicaid Other | Attending: Emergency Medicine | Admitting: Emergency Medicine

## 2019-08-10 ENCOUNTER — Emergency Department (HOSPITAL_COMMUNITY): Payer: Medicaid Other

## 2019-08-10 ENCOUNTER — Encounter (HOSPITAL_COMMUNITY): Payer: Self-pay

## 2019-08-10 DIAGNOSIS — I132 Hypertensive heart and chronic kidney disease with heart failure and with stage 5 chronic kidney disease, or end stage renal disease: Secondary | ICD-10-CM | POA: Insufficient documentation

## 2019-08-10 DIAGNOSIS — Z992 Dependence on renal dialysis: Secondary | ICD-10-CM | POA: Diagnosis not present

## 2019-08-10 DIAGNOSIS — N186 End stage renal disease: Secondary | ICD-10-CM | POA: Insufficient documentation

## 2019-08-10 DIAGNOSIS — E1165 Type 2 diabetes mellitus with hyperglycemia: Secondary | ICD-10-CM | POA: Insufficient documentation

## 2019-08-10 DIAGNOSIS — Z79899 Other long term (current) drug therapy: Secondary | ICD-10-CM | POA: Insufficient documentation

## 2019-08-10 DIAGNOSIS — I5030 Unspecified diastolic (congestive) heart failure: Secondary | ICD-10-CM | POA: Insufficient documentation

## 2019-08-10 DIAGNOSIS — D649 Anemia, unspecified: Secondary | ICD-10-CM | POA: Insufficient documentation

## 2019-08-10 DIAGNOSIS — Z794 Long term (current) use of insulin: Secondary | ICD-10-CM | POA: Insufficient documentation

## 2019-08-10 DIAGNOSIS — K5900 Constipation, unspecified: Secondary | ICD-10-CM | POA: Diagnosis not present

## 2019-08-10 DIAGNOSIS — R1084 Generalized abdominal pain: Secondary | ICD-10-CM | POA: Insufficient documentation

## 2019-08-10 DIAGNOSIS — R109 Unspecified abdominal pain: Secondary | ICD-10-CM | POA: Diagnosis present

## 2019-08-10 LAB — COMPREHENSIVE METABOLIC PANEL
ALT: 64 U/L — ABNORMAL HIGH (ref 0–44)
AST: 40 U/L (ref 15–41)
Albumin: 3.4 g/dL — ABNORMAL LOW (ref 3.5–5.0)
Alkaline Phosphatase: 206 U/L — ABNORMAL HIGH (ref 38–126)
Anion gap: 14 (ref 5–15)
BUN: 43 mg/dL — ABNORMAL HIGH (ref 8–23)
CO2: 27 mmol/L (ref 22–32)
Calcium: 8.7 mg/dL — ABNORMAL LOW (ref 8.9–10.3)
Chloride: 93 mmol/L — ABNORMAL LOW (ref 98–111)
Creatinine, Ser: 3.54 mg/dL — ABNORMAL HIGH (ref 0.44–1.00)
GFR calc Af Amer: 15 mL/min — ABNORMAL LOW (ref >60)
GFR calc non Af Amer: 13 mL/min — ABNORMAL LOW (ref >60)
Glucose, Bld: 172 mg/dL — ABNORMAL HIGH (ref 70–99)
Potassium: 3.3 mmol/L — ABNORMAL LOW (ref 3.5–5.1)
Sodium: 134 mmol/L — ABNORMAL LOW (ref 135–145)
Total Bilirubin: 0.8 mg/dL (ref 0.3–1.2)
Total Protein: 6.6 g/dL (ref 6.5–8.1)

## 2019-08-10 LAB — CBC
HCT: 29.1 % — ABNORMAL LOW (ref 36.0–46.0)
Hemoglobin: 8.9 g/dL — ABNORMAL LOW (ref 12.0–15.0)
MCH: 32.6 pg (ref 26.0–34.0)
MCHC: 30.6 g/dL (ref 30.0–36.0)
MCV: 106.6 fL — ABNORMAL HIGH (ref 80.0–100.0)
Platelets: 167 10*3/uL (ref 150–400)
RBC: 2.73 MIL/uL — ABNORMAL LOW (ref 3.87–5.11)
RDW: 12.2 % (ref 11.5–15.5)
WBC: 5.9 10*3/uL (ref 4.0–10.5)
nRBC: 0 % (ref 0.0–0.2)

## 2019-08-10 LAB — LIPASE, BLOOD: Lipase: 12 U/L (ref 11–51)

## 2019-08-10 LAB — TROPONIN I (HIGH SENSITIVITY)
Troponin I (High Sensitivity): 61 ng/L — ABNORMAL HIGH (ref <18)
Troponin I (High Sensitivity): 55 ng/L — ABNORMAL HIGH (ref <18)

## 2019-08-10 MED ORDER — POLYETHYLENE GLYCOL 3350 17 G PO PACK: 17.0000 g | PACK | Freq: Every day | ORAL | 0 refills | Status: DC

## 2019-08-10 NOTE — ED Notes (Signed)
Pt trying to call for  A ride.

## 2019-08-10 NOTE — ED Provider Notes (Signed)
Erica [name] Provider Note   CSN: [id number] Arrival date & time: 08/10/19  5732     History Chief Complaint  Patient presents with  . Weakness    Erica [name] is a 64 y.o. female.  64 year old female with past with history of end-stage renal disease (attends dialysis Tuesday, Thursday, Saturday), diabetes, pancreatitis and additional history as listed below brought in by EMS from home with nausea and vomiting with abdominal pain.  Patient states that she ate grits last night and about 30 minutes later began vomiting.  Patient was able to sleep all night and then awoke this morning with additional vomiting which prompted her to call 911.  Patient reports right-sided abdominal pain, aching in nature, constant, has not taken anything for her pain, nothing makes her pain worse.  Patient states that she is constipated, states her stools are dark for an unknown amount of time, no history of prior GI bleed, does not take NSAIDs.  Patient reports making small amount of urine, denies dysuria.  Last dialysis was on Tuesday, did complete the full session without difficulty and is scheduled to go back today.        Past Medical History:  Diagnosis Date  . Alcohol-induced pancreatitis   . Chronic diarrhea   . Depression   . Diabetes mellitus    fasting blood sugar 110-120s  . Diastolic CHF ([place])   . DKA (diabetic ketoacidoses) ([place])   . Gastroparesis   . GERD (gastroesophageal reflux disease)   . Heart murmur   . History of kidney stones   . Hyperlipidemia   . Hypertension   . Hypokalemia   . Muscle spasm   . Neuropathic pain   . Neuropathy    Hx: of  . Pyelonephritis   . Seizures ([place])   . Vitamin B12 deficiency   . Vitamin D deficiency     Patient Active Problem List   Diagnosis Date Noted  . Closed fracture of right proximal humerus 04/20/2019  . Surgery, elective   . Seizure ([place]) 11/12/2018  . History of hydronephrosis --stents in place  12/08/2017  . Stroke ([place]) 11/29/2017  . Seizures ([place])   . Hydronephrosis   . Recurrent UTI   . Diabetes mellitus type 2 in nonobese (HCC)   . ESRD (end stage renal disease) ([place])   . Anemia of chronic disease   . Chronic diastolic congestive heart failure ([place])   . Essential hypertension   . PICC (peripherally inserted central catheter) in place   . Acute pyelonephritis 11/12/2017  . Uncontrolled type 2 diabetes mellitus with hyperglycemia, with long-term current use of insulin ([place]) 11/10/2017  . Renal failure 11/08/2017  . Seizure disorder ([place]) 11/08/2017  . Orthostatic hypotension 07/25/2014  . Moderate protein malnutrition ([place]) 09/15/2013  . Aspiration pneumonia ([place]) 09/15/2013  . Acute respiratory failure requiring reintubation ([place]) 09/11/2013  . probable Seizures due to metabolic disorder 20/25/4270  . Lactic acidosis 03/19/2013  . Abdominal pain 03/19/2013  . Rotavirus infection 10/29/2012  . Type II or unspecified type diabetes mellitus without mention of complication, uncontrolled 10/29/2012  . Protein-calorie malnutrition, severe ([place]) 10/27/2012  . NSTEMI (non-ST elevated myocardial infarction) ([place]) 10/26/2012  . Fever, unspecified 10/26/2012  . Hypotension 10/25/2012  . Metabolic acidosis 62/37/6283  . Chronic diarrhea 10/25/2012  . Tobacco abuse 10/25/2012  . DKA (diabetic ketoacidoses) ([place]) 09/09/2012  . Dehydration 09/09/2012  . DKA, type 2 ([place]) 05/20/2012  . Abnormal LFTs 05/20/2012  . Heart murmur, systolic 15/17/6160  .  Hypoglycemia 07/08/2011  . Metabolic encephalopathy 14/43/1540  . Alcohol abuse 07/08/2011  . Hypokalemia 07/08/2011  . Nausea & vomiting 07/08/2011  . H/O chronic pancreatitis 07/08/2011    Past Surgical History:  Procedure Laterality Date  . AV FISTULA PLACEMENT Left 12/15/2017   Procedure: INSERTION OF ARTERIOVENOUS (AV) GORE-TEX GRAFT ARM;  Surgeon: Rosetta Posner, MD;  Location: [facility];  Service: Vascular;  Laterality: Left;   . CATARACT EXTRACTION W/PHACO Right 03/14/2015   Procedure: CATARACT EXTRACTION PHACO AND INTRAOCULAR LENS PLACEMENT (IOC);  Surgeon: Baruch Goldmann, MD;  Location: [facility];  Service: Ophthalmology;  Laterality: Right;  CDE:11.13  . CATARACT EXTRACTION W/PHACO Left 04/11/2015   Procedure: CATARACT EXTRACTION PHACO AND INTRAOCULAR LENS PLACEMENT LEFT EYE CDE=9.68;  Surgeon: Baruch Goldmann, MD;  Location: [facility];  Service: Ophthalmology;  Laterality: Left;  . COLONOSCOPY  02/24/2010  . cystoscopy with ureteral stent  Bilateral 10/07/2017   At [place] CV LINE RIGHT  11/17/2017  . IR FLUORO GUIDE CV LINE RIGHT  11/22/2017  . IR REMOVAL TUN CV CATH W/O FL  01/26/2018  . IR US GUIDE VASC ACCESS RIGHT  11/17/2017  . MULTIPLE EXTRACTIONS WITH ALVEOLOPLASTY N/A 06/13/2012   Procedure: MULTIPLE EXTRACION WITH ALVEOLOPLASTY EXTRACT: 18, 19, 20, 21, 22, 24, 25, 27, 28, 29, 30, 31;  Surgeon: Gae Bon, DDS;  Location: [facility];  Service: Oral Surgery;  Laterality: N/A;  . ORIF HUMERUS FRACTURE Right 04/20/2019   Procedure: OPEN REDUCTION INTERNAL FIXATION (ORIF) PROXIMAL HUMERUS FRACTURE;  Surgeon: Marchia Bond, MD;  Location: [facility];  Service: Orthopedics;  Laterality: Right;  . TUBAL LIGATION    . URETERAL STENT PLACEMENT  09/2017     OB History    Gravida      Para      Term      Preterm      AB      Living  2     SAB      TAB      Ectopic      Multiple      Live Births              Family History  Problem Relation Age of Onset  . Diabetes Sister   . Chronic Renal Failure Neg Hx     Social History   Tobacco Use  . Smoking status: Never Smoker  . Smokeless tobacco: Current User    Types: Snuff  Vaping Use  . Vaping Use: Never used  Substance Use Topics  . Alcohol use: Not Currently    Alcohol/week: 1.0 standard drink    Types: 1 Cans of beer per week  . Drug use: No    Home Medications Prior to Admission medications   Medication Sig  Start Date End Date Taking? Authorizing Provider  acetaminophen (TYLENOL) 325 MG tablet Take 650 mg by mouth every 4 (four) hours as needed for moderate pain.   Yes [provider]  calcitRIOL (ROCALTROL) 0.25 MCG capsule Take 1 capsule (0.25 mcg total) by mouth Every Tuesday,Thursday,and Saturday with dialysis. 12/18/17  Yes Love, Ivan Anchors, PA-C  carboxymethylcellulose (ARTIFICIAL TEARS) 1 % ophthalmic solution Place 1 drop into both eyes daily as needed (dry eyes).   Yes [provider]  carvedilol (COREG) 6.25 MG tablet Take 1 tablet (6.25 mg total) by mouth 2 (two) times daily. 11/15/18  Yes Buriev, Arie Sabina, MD  cinacalcet (SENSIPAR) 30 MG tablet Take 30 mg by  mouth every evening.   Yes [provider]  dextrose (GLUTOSE) 40 % GEL Take 1 Tube by mouth every 10 (ten) minutes as needed for low blood sugar (unitl blood sugar is above 70).   Yes [provider]  Emollient (CETAPHIL) cream Apply 1 application topically every 12 (twelve) hours as needed (dry skin).   Yes [provider]  ferric citrate (AURYXIA) 1 GM 210 MG(Fe) tablet Take 2 tablets (420 mg total) by mouth 3 (three) times daily with meals. 11/15/18  Yes Buriev, Arie Sabina, MD  folic acid (FOLVITE) 1 MG tablet Take 1 mg by mouth every morning.   Yes [provider]  insulin lispro (HUMALOG) 100 UNIT/ML injection Inject 2-10 Units into the skin 4 (four) times daily. Per sliding scale: CBG 151-200 2 units, 201-250 4 units, 251-300 6 units, 301-350 8 units, 351-400 10 units,   Yes [provider]  levETIRAcetam (KEPPRA) 1000 MG tablet Take 1,000 mg by mouth every morning.   Yes [provider]  loperamide (IMODIUM) 2 MG capsule Take 4 mg by mouth daily as needed for diarrhea or loose stools.   Yes [provider]  NIFEdipine (PROCARDIA-XL/NIFEDICAL-XL) 30 MG 24 hr tablet Take 30 mg by mouth every morning. 07/10/19  Yes [provider]  phenytoin (DILANTIN  INFATABS) 50 MG tablet Chew 200 mg by mouth 2 (two) times daily.    Yes [provider]  rOPINIRole (REQUIP) 1 MG tablet Take 1 mg by mouth 3 (three) times daily.   Yes [provider]  senna (SENOKOT) 8.6 MG TABS tablet Take 8.6 mg by mouth in the morning and at bedtime.   Yes [provider]  sodium bicarbonate 650 MG tablet Take 650 mg by mouth 4 (four) times daily. For indigestion - midnight, 6am, noon and 6pm   Yes [provider]  topiramate (TOPAMAX) 25 MG tablet Take 1 tablet (25 mg total) by mouth 2 (two) times daily. 11/29/17 11/12/19 Yes Arrien, Jimmy Picket, MD  Vitamin D, Ergocalciferol, (DRISDOL) 1.25 MG (50000 UT) CAPS capsule Take 50,000 Units by mouth every Friday.   Yes [provider]  albuterol (PROAIR HFA) 108 (90 Base) MCG/ACT inhaler Inhale 2 puffs into the lungs every 4 (four) hours as needed for wheezing or shortness of breath. Patient not taking: Reported on 08/10/2019    [provider]  LORazepam (ATIVAN) 2 MG/ML concentrated solution Take 2 mg by mouth every 6 (six) hours as needed for seizure.  Patient not taking: Reported on 08/10/2019    [provider]  Oxycodone HCl 10 MG TABS SMARTSIG:1 Tablet(s) By Mouth Every 12 Hours Patient not taking: Reported on 08/10/2019 08/09/19   [provider]  polyethylene glycol (MIRALAX) 17 g packet Take 17 g by mouth daily. 08/10/19   Tacy Learn, PA-C    Allergies    Aspirin  Review of Systems   Review of Systems  Constitutional: Negative for chills, diaphoresis and fever.  Respiratory: Negative for shortness of breath.   Cardiovascular: Negative for chest pain.  Gastrointestinal: Positive for abdominal pain, constipation, nausea and vomiting. Negative for diarrhea.  Genitourinary: Negative for dysuria.  Musculoskeletal: Negative for arthralgias and myalgias.  Skin: Negative for rash and wound.  Allergic/Immunologic: Positive for immunocompromised  state.  Neurological: Positive for weakness.  All other systems reviewed and are negative.   Physical Exam Updated Vital Signs BP (!) 183/69   Pulse 73   Temp 98.4 F (36.9 C) (Oral)  Resp 13   Wt 43.5 kg   SpO2 98%   BMI 19.37 kg/m   Physical Exam Vitals and nursing note reviewed.  Constitutional:      General: She is not in acute distress.    Appearance: She is well-developed. She is not diaphoretic.  HENT:     Head: Normocephalic and atraumatic.  Cardiovascular:     Rate and Rhythm: Normal rate and regular rhythm.     Pulses: Normal pulses.     Heart sounds: Normal heart sounds.  Pulmonary:     Effort: Pulmonary effort is normal.     Breath sounds: Normal breath sounds.  Abdominal:     Palpations: Abdomen is soft.     Tenderness: There is abdominal tenderness in the right upper quadrant and right lower quadrant. There is no right CVA tenderness or left CVA tenderness.  Genitourinary:    Rectum: Guaiac result negative.     Comments: Stool soft and brown, hemoccult negative Musculoskeletal:     Right lower leg: Edema present.     Left lower leg: Edema present.     Comments: Mild pitting edema to bilateral lower extremities extends to knees  Skin:    General: Skin is warm and dry.  Neurological:     Mental Status: She is alert and oriented to person, place, and time.  Psychiatric:        Behavior: Behavior normal.     ED Results / Procedures / Treatments   Labs (all labs ordered are listed, but only abnormal results are displayed) Labs Reviewed  CBC - Abnormal; Notable for the following components:      Result Value   RBC 2.73 (*)    Hemoglobin 8.9 (*)    HCT 29.1 (*)    MCV 106.6 (*)    All other components within normal limits  COMPREHENSIVE METABOLIC PANEL - Abnormal; Notable for the following components:   Sodium 134 (*)    Potassium 3.3 (*)    Chloride 93 (*)    Glucose, Bld 172 (*)    BUN 43 (*)    Creatinine, Ser 3.54 (*)    Calcium 8.7 (*)     Albumin 3.4 (*)    ALT 64 (*)    Alkaline Phosphatase 206 (*)    GFR calc non Af Amer 13 (*)    GFR calc Af Amer 15 (*)    All other components within normal limits  TROPONIN I (HIGH SENSITIVITY) - Abnormal; Notable for the following components:   Troponin I (High Sensitivity) 61 (*)    All other components within normal limits  TROPONIN I (HIGH SENSITIVITY) - Abnormal; Notable for the following components:   Troponin I (High Sensitivity) 55 (*)    All other components within normal limits  LIPASE, BLOOD  URINALYSIS, ROUTINE W REFLEX MICROSCOPIC  POC OCCULT BLOOD, ED    EKG EKG Interpretation  Date/Time:  Thursday August 10 2019 10:34:44 EDT Ventricular Rate:  74 PR Interval:    QRS Duration: 81 QT Interval:  494 QTC Calculation: 545 R Axis:   -20 Text Interpretation: Sinus rhythm Borderline left axis deviation Borderline low voltage, extremity leads Minimal ST depression, inferior leads Prolonged QT interval Baseline wander in lead(s) II aVR aVF V2 V3 Since last tracing QT has lengthened Otherwise no significant change Confirmed by Daleen Bo [phone]) on 08/10/2019 1:28:11 PM   Radiology CT ABDOMEN PELVIS WO CONTRAST  Result Date: 08/10/2019 CLINICAL DATA:  Generalized abdominal pain, nausea and vomiting.  Hypertensive. EXAM: CT ABDOMEN AND PELVIS WITHOUT CONTRAST TECHNIQUE: Multidetector CT imaging of the abdomen and pelvis was performed following the standard protocol without IV contrast. COMPARISON:  11/09/2017 FINDINGS: Lower chest: Smooth septal thickening in the visualized lung bases with small bilateral pleural effusions. Heart is enlarged. There is decreased attenuation of the intravascular compartment, indicative of anemia. No pericardial effusion. Distal esophagus is grossly unremarkable. Hepatobiliary: 1.7 cm low-attenuation lesion in the dome of the liver is difficult to further characterize without post-contrast imaging. Mild periportal edema. Difficult to exclude  biliary ductal dilatation. Gallbladder appears to be absent. Pancreas: Calcifications are seen throughout an atrophic pancreas. Spleen: Negative. Adrenals/Urinary Tract: Adrenal glands are unremarkable. Stones are seen in the kidneys bilaterally. Proximal and mid ureters are decompressed. Distal ureters and bladder are obscured by streak artifact from a right hip arthroplasty. Stomach/Bowel: Stomach, small bowel and appendix are unremarkable. A fair amount of stool is seen in the colon, indicative of constipation. Large amount of stool in the rectum suggests fecal impaction. Vascular/Lymphatic: Atherosclerotic calcification of the aorta without aneurysm. Retroperitoneal lymph nodes measure up to 13 mm in the left periaortic station as before. Reproductive: Uterus is visualized. No definite adnexal mass although streak artifact from a right hip arthroplasty largely obscures the inferior anatomic pelvis. Other: Diffuse body wall edema. Haziness throughout the intraperitoneal and retroperitoneal fat. Small ascites. Musculoskeletal: Right hip arthroplasty. Degenerative changes in the spine. Thoracolumbar compression fractures are unchanged. IMPRESSION: 1. Poor resolution due to lack of IV contrast and diffuse edema. No definitive acute findings to explain the patient's given symptoms. Stool throughout the colon with a large amount of stool in the rectum, findings indicative of constipation and fecal impaction. 2. Congestive heart failure. 3. Small ascites. 4. Chronic calcific pancreatitis. 5. Bilateral renal stones. 6.  Aortic atherosclerosis (ICD10-I70.0). Electronically Signed   By: Lorin Picket M.D.   On: 08/10/2019 14:38    Procedures Procedures (including critical care time)  Medications Ordered in ED Medications - No data to display  ED Course  I have reviewed the triage vital signs and the nursing notes.  Pertinent labs & imaging results that were available during my care of the patient were  reviewed by me and considered in my medical decision making (see chart for details).  Clinical Course as of Aug 10 1638  Thu Aug 09, 1584  9310 64 year old female with history of dialysis presents for nausea and vomiting onset last night with right side abdominal pain.  On exam to have right-sided abdominal tenderness, appears to feel unwell today, states that she has been constipated with dark stools for an unknown amount of time. Review of labs, CBC with hemoglobin of 8.9, not significantly changed from last check 2 weeks ago however a month ago hemoglobin was 12.  Hemoccult is negative.  CMP without significant changes from baseline, does have potassium of 3.3 today.  Lipase within normal limits, troponin ordered per triage protocol and is 55 without complaints of chest pain in this patient without acute ischemic changes, suspect this is due to her renal disease.   [LM]  1610 CT without acute findings to explain patient's symptoms.  Patient has tolerated p.o. contrast for CT study. Case discussed with Dr. Eulis Foster, ER attending who has seen the patient.  Plan is to discharge back to her nursing facility with prescription for MiraLAX to assist with her constipation. Patient was moved to hallway bed, appears to be feeling much better, tolerating p.o. fluids without difficulty.   [LM]  Clinical Course User Index [LM] Roque Lias   MDM Rules/Calculators/A&P                          Final Clinical Impression(s) / ED Diagnoses Final diagnoses:  Generalized abdominal pain  Constipation, unspecified constipation type  Anemia, unspecified type    Rx / DC Orders ED Discharge Orders         Ordered    polyethylene glycol (MIRALAX) 17 g packet  Daily     Discontinue  Reprint     08/10/19 1535           Tacy Learn, PA-C 08/10/19 1640    Daleen Bo, MD 08/10/19 1709

## 2019-08-10 NOTE — ED Provider Notes (Signed)
  Face-to-face evaluation   History: She presents for evaluation of malaise.  She is a dialysis patient.  Physical exam: Elderly.  No respiratory distress.  Abdomen soft nontender.  Oral mucous membranes are moist.  She told me that  Medical screening examination/treatment/procedure(s) were conducted as a shared visit with non-physician practitioner(s) and myself.  I personally evaluated the patient during the encounter    Daleen Bo, MD 08/10/19 [phone]

## 2019-08-10 NOTE — ED Triage Notes (Signed)
Pt is a dialysis patient and did not have it today due to not feeling well. She came today due to nausea and vomiting. Pt hypertensive with EMS.

## 2019-08-10 NOTE — ED Notes (Signed)
Pt. With ct.

## 2019-08-10 NOTE — Discharge Instructions (Signed)
Take Miralax daily for constipation. Follow up with your doctor. Return to the ER for new or worsening symptoms.

## 2019-09-27 ENCOUNTER — Encounter (HOSPITAL_COMMUNITY): Payer: Self-pay

## 2019-09-27 ENCOUNTER — Inpatient Hospital Stay (HOSPITAL_COMMUNITY)
Admission: EM | Admit: 2019-09-27 | Discharge: 2019-09-29 | DRG: 640 | Disposition: A | Discharge status: Home / Self Care | Payer: Medicaid Other | Attending: Internal Medicine | Admitting: Internal Medicine

## 2019-09-27 ENCOUNTER — Other Ambulatory Visit: Payer: Self-pay

## 2019-09-27 ENCOUNTER — Emergency Department (HOSPITAL_COMMUNITY): Payer: Medicaid Other

## 2019-09-27 DIAGNOSIS — E8889 Other specified metabolic disorders: Secondary | ICD-10-CM | POA: Diagnosis present

## 2019-09-27 DIAGNOSIS — I132 Hypertensive heart and chronic kidney disease with heart failure and with stage 5 chronic kidney disease, or end stage renal disease: Secondary | ICD-10-CM | POA: Diagnosis present

## 2019-09-27 DIAGNOSIS — E538 Deficiency of other specified B group vitamins: Secondary | ICD-10-CM | POA: Diagnosis present

## 2019-09-27 DIAGNOSIS — K861 Other chronic pancreatitis: Secondary | ICD-10-CM | POA: Diagnosis present

## 2019-09-27 DIAGNOSIS — R531 Weakness: Secondary | ICD-10-CM

## 2019-09-27 DIAGNOSIS — D631 Anemia in chronic kidney disease: Secondary | ICD-10-CM | POA: Diagnosis present

## 2019-09-27 DIAGNOSIS — E871 Hypo-osmolality and hyponatremia: Secondary | ICD-10-CM | POA: Diagnosis not present

## 2019-09-27 DIAGNOSIS — F1021 Alcohol dependence, in remission: Secondary | ICD-10-CM | POA: Diagnosis present

## 2019-09-27 DIAGNOSIS — Z79899 Other long term (current) drug therapy: Secondary | ICD-10-CM

## 2019-09-27 DIAGNOSIS — Z8673 Personal history of transient ischemic attack (TIA), and cerebral infarction without residual deficits: Secondary | ICD-10-CM

## 2019-09-27 DIAGNOSIS — E1143 Type 2 diabetes mellitus with diabetic autonomic (poly)neuropathy: Secondary | ICD-10-CM | POA: Diagnosis present

## 2019-09-27 DIAGNOSIS — Z20822 Contact with and (suspected) exposure to covid-19: Secondary | ICD-10-CM | POA: Diagnosis present

## 2019-09-27 DIAGNOSIS — Z720 Tobacco use: Secondary | ICD-10-CM

## 2019-09-27 DIAGNOSIS — E1122 Type 2 diabetes mellitus with diabetic chronic kidney disease: Secondary | ICD-10-CM | POA: Diagnosis present

## 2019-09-27 DIAGNOSIS — N12 Tubulo-interstitial nephritis, not specified as acute or chronic: Secondary | ICD-10-CM | POA: Diagnosis present

## 2019-09-27 DIAGNOSIS — E869 Volume depletion, unspecified: Secondary | ICD-10-CM | POA: Diagnosis present

## 2019-09-27 DIAGNOSIS — Z833 Family history of diabetes mellitus: Secondary | ICD-10-CM

## 2019-09-27 DIAGNOSIS — K3184 Gastroparesis: Secondary | ICD-10-CM | POA: Diagnosis present

## 2019-09-27 DIAGNOSIS — Z992 Dependence on renal dialysis: Secondary | ICD-10-CM

## 2019-09-27 DIAGNOSIS — Z87442 Personal history of urinary calculi: Secondary | ICD-10-CM

## 2019-09-27 DIAGNOSIS — K219 Gastro-esophageal reflux disease without esophagitis: Secondary | ICD-10-CM | POA: Diagnosis present

## 2019-09-27 DIAGNOSIS — F329 Major depressive disorder, single episode, unspecified: Secondary | ICD-10-CM | POA: Diagnosis present

## 2019-09-27 DIAGNOSIS — Z79891 Long term (current) use of opiate analgesic: Secondary | ICD-10-CM

## 2019-09-27 DIAGNOSIS — Z886 Allergy status to analgesic agent status: Secondary | ICD-10-CM

## 2019-09-27 DIAGNOSIS — Z794 Long term (current) use of insulin: Secondary | ICD-10-CM

## 2019-09-27 DIAGNOSIS — R112 Nausea with vomiting, unspecified: Secondary | ICD-10-CM

## 2019-09-27 DIAGNOSIS — I5032 Chronic diastolic (congestive) heart failure: Secondary | ICD-10-CM | POA: Diagnosis present

## 2019-09-27 DIAGNOSIS — E785 Hyperlipidemia, unspecified: Secondary | ICD-10-CM | POA: Diagnosis present

## 2019-09-27 DIAGNOSIS — Z8719 Personal history of other diseases of the digestive system: Secondary | ICD-10-CM

## 2019-09-27 DIAGNOSIS — G40909 Epilepsy, unspecified, not intractable, without status epilepticus: Secondary | ICD-10-CM | POA: Diagnosis present

## 2019-09-27 DIAGNOSIS — E559 Vitamin D deficiency, unspecified: Secondary | ICD-10-CM | POA: Diagnosis present

## 2019-09-27 DIAGNOSIS — N186 End stage renal disease: Secondary | ICD-10-CM | POA: Diagnosis present

## 2019-09-27 DIAGNOSIS — N2581 Secondary hyperparathyroidism of renal origin: Secondary | ICD-10-CM | POA: Diagnosis present

## 2019-09-27 LAB — CBC WITH DIFFERENTIAL/PLATELET
Abs Immature Granulocytes: 0.02 10*3/uL (ref 0.00–0.07)
Basophils Absolute: 0 10*3/uL (ref 0.0–0.1)
Basophils Relative: 0 %
Eosinophils Absolute: 0.1 10*3/uL (ref 0.0–0.5)
Eosinophils Relative: 1 %
HCT: 33.5 % — ABNORMAL LOW (ref 36.0–46.0)
Hemoglobin: 11 g/dL — ABNORMAL LOW (ref 12.0–15.0)
Immature Granulocytes: 0 %
Lymphocytes Relative: 14 %
Lymphs Abs: 1.2 10*3/uL (ref 0.7–4.0)
MCH: 34.1 pg — ABNORMAL HIGH (ref 26.0–34.0)
MCHC: 32.8 g/dL (ref 30.0–36.0)
MCV: 103.7 fL — ABNORMAL HIGH (ref 80.0–100.0)
Monocytes Absolute: 0.9 10*3/uL (ref 0.1–1.0)
Monocytes Relative: 10 %
Neutro Abs: 6.8 10*3/uL (ref 1.7–7.7)
Neutrophils Relative %: 75 %
Platelets: 174 10*3/uL (ref 150–400)
RBC: 3.23 MIL/uL — ABNORMAL LOW (ref 3.87–5.11)
RDW: 12 % (ref 11.5–15.5)
WBC: 9.1 10*3/uL (ref 4.0–10.5)
nRBC: 0 % (ref 0.0–0.2)

## 2019-09-27 LAB — COMPREHENSIVE METABOLIC PANEL
ALT: 30 U/L (ref 0–44)
AST: 27 U/L (ref 15–41)
Albumin: 3.9 g/dL (ref 3.5–5.0)
Alkaline Phosphatase: 115 U/L (ref 38–126)
Anion gap: 14 (ref 5–15)
BUN: 43 mg/dL — ABNORMAL HIGH (ref 8–23)
CO2: 21 mmol/L — ABNORMAL LOW (ref 22–32)
Calcium: 8 mg/dL — ABNORMAL LOW (ref 8.9–10.3)
Chloride: 86 mmol/L — ABNORMAL LOW (ref 98–111)
Creatinine, Ser: 5.64 mg/dL — ABNORMAL HIGH (ref 0.44–1.00)
GFR calc Af Amer: 9 mL/min — ABNORMAL LOW (ref >60)
GFR calc non Af Amer: 7 mL/min — ABNORMAL LOW (ref >60)
Glucose, Bld: 124 mg/dL — ABNORMAL HIGH (ref 70–99)
Potassium: 4 mmol/L (ref 3.5–5.1)
Sodium: 121 mmol/L — ABNORMAL LOW (ref 135–145)
Total Bilirubin: 0.9 mg/dL (ref 0.3–1.2)
Total Protein: 7.5 g/dL (ref 6.5–8.1)

## 2019-09-27 LAB — LIPASE, BLOOD: Lipase: 15 U/L (ref 11–51)

## 2019-09-27 LAB — URINALYSIS, ROUTINE W REFLEX MICROSCOPIC
Bilirubin Urine: NEGATIVE
Glucose, UA: 50 mg/dL — AB
Ketones, ur: NEGATIVE mg/dL
Nitrite: NEGATIVE
Protein, ur: >=300 mg/dL — AB
Specific Gravity, Urine: 1.013 (ref 1.005–1.030)
WBC, UA: >50 WBC/hpf — ABNORMAL HIGH (ref 0–5)
pH: 7 (ref 5.0–8.0)

## 2019-09-27 LAB — ETHANOL: Alcohol, Ethyl (B): <10 mg/dL (ref <10)

## 2019-09-27 LAB — SARS CORONAVIRUS 2 BY RT PCR (HOSPITAL ORDER, PERFORMED IN ~~LOC~~ HOSPITAL LAB): SARS Coronavirus 2: NEGATIVE

## 2019-09-27 LAB — CBG MONITORING, ED: Glucose-Capillary: 133 mg/dL — ABNORMAL HIGH (ref 70–99)

## 2019-09-27 MED ORDER — ONDANSETRON HCL 4 MG PO TABS
4.0000 mg | ORAL_TABLET | Freq: Four times a day (QID) | ORAL | Status: DC | PRN
  Administered 2019-09-27 – 2019-09-28 (×2): 4 mg via ORAL
  Filled 2019-09-27 (×2): qty 1

## 2019-09-27 MED ORDER — ACETAMINOPHEN 650 MG RE SUPP: 650.0000 mg | Freq: Four times a day (QID) | RECTAL | Status: DC | PRN

## 2019-09-27 MED ORDER — SODIUM CHLORIDE 0.9 % IV BOLUS
250.0000 mL | Freq: Once | INTRAVENOUS | Status: AC
  Administered 2019-09-27: 250 mL via INTRAVENOUS

## 2019-09-27 MED ORDER — ROPINIROLE HCL 1 MG PO TABS
1.0000 mg | ORAL_TABLET | Freq: Three times a day (TID) | ORAL | Status: DC
  Administered 2019-09-27 – 2019-09-29 (×5): 1 mg via ORAL
  Filled 2019-09-27 (×5): qty 1

## 2019-09-27 MED ORDER — LEVETIRACETAM 500 MG PO TABS
1000.0000 mg | ORAL_TABLET | Freq: Every morning | ORAL | Status: DC
  Administered 2019-09-28 – 2019-09-29 (×2): 1000 mg via ORAL
  Filled 2019-09-27: qty 2
  Filled 2019-09-27: qty 4

## 2019-09-27 MED ORDER — ACETAMINOPHEN 325 MG PO TABS: 650.0000 mg | ORAL_TABLET | Freq: Four times a day (QID) | ORAL | Status: DC | PRN

## 2019-09-27 MED ORDER — MORPHINE SULFATE (PF) 2 MG/ML IV SOLN
2.0000 mg | Freq: Once | INTRAVENOUS | Status: AC
  Administered 2019-09-27: 2 mg via INTRAVENOUS
  Filled 2019-09-27: qty 1

## 2019-09-27 MED ORDER — SODIUM CHLORIDE 0.9 % IV SOLN: INTRAVENOUS | Status: DC

## 2019-09-27 MED ORDER — FOLIC ACID 1 MG PO TABS
1.0000 mg | ORAL_TABLET | Freq: Every morning | ORAL | Status: DC
  Administered 2019-09-28 – 2019-09-29 (×2): 1 mg via ORAL
  Filled 2019-09-27 (×2): qty 1

## 2019-09-27 MED ORDER — PHENYTOIN 50 MG PO CHEW
200.0000 mg | CHEWABLE_TABLET | Freq: Two times a day (BID) | ORAL | Status: DC
  Administered 2019-09-27 – 2019-09-29 (×4): 200 mg via ORAL
  Filled 2019-09-27 (×4): qty 4

## 2019-09-27 MED ORDER — TOPIRAMATE 25 MG PO TABS
25.0000 mg | ORAL_TABLET | Freq: Two times a day (BID) | ORAL | Status: DC
  Administered 2019-09-27 – 2019-09-29 (×4): 25 mg via ORAL
  Filled 2019-09-27 (×4): qty 1

## 2019-09-27 MED ORDER — THIAMINE HCL 100 MG PO TABS
100.0000 mg | ORAL_TABLET | Freq: Every day | ORAL | Status: DC
  Administered 2019-09-28 – 2019-09-29 (×2): 100 mg via ORAL
  Filled 2019-09-27 (×2): qty 1

## 2019-09-27 MED ORDER — NIFEDIPINE ER OSMOTIC RELEASE 30 MG PO TB24
30.0000 mg | ORAL_TABLET | Freq: Every morning | ORAL | Status: DC
  Administered 2019-09-28 – 2019-09-29 (×2): 30 mg via ORAL
  Filled 2019-09-27 (×2): qty 1

## 2019-09-27 MED ORDER — ONDANSETRON HCL 4 MG/2ML IJ SOLN
4.0000 mg | Freq: Once | INTRAMUSCULAR | Status: AC
  Administered 2019-09-27: 4 mg via INTRAVENOUS
  Filled 2019-09-27: qty 2

## 2019-09-27 MED ORDER — CARVEDILOL 3.125 MG PO TABS
6.2500 mg | ORAL_TABLET | Freq: Two times a day (BID) | ORAL | Status: DC
  Administered 2019-09-27 – 2019-09-29 (×3): 6.25 mg via ORAL
  Filled 2019-09-27 (×2): qty 2
  Filled 2019-09-27: qty 1
  Filled 2019-09-27 (×2): qty 2

## 2019-09-27 MED ORDER — SODIUM CHLORIDE 0.9 % IV SOLN: 1.0000 g | Freq: Once | INTRAVENOUS | Status: DC

## 2019-09-27 MED ORDER — OXYCODONE HCL 5 MG PO TABS
5.0000 mg | ORAL_TABLET | Freq: Four times a day (QID) | ORAL | Status: AC | PRN
  Administered 2019-09-28: 5 mg via ORAL
  Filled 2019-09-27: qty 1

## 2019-09-27 MED ORDER — POLYETHYLENE GLYCOL 3350 17 G PO PACK: 17.0000 g | PACK | Freq: Every day | ORAL | Status: DC | PRN

## 2019-09-27 MED ORDER — CINACALCET HCL 30 MG PO TABS
30.0000 mg | ORAL_TABLET | Freq: Every evening | ORAL | Status: DC
  Administered 2019-09-27 – 2019-09-28 (×2): 30 mg via ORAL
  Filled 2019-09-27 (×2): qty 1

## 2019-09-27 MED ORDER — ONDANSETRON HCL 4 MG/2ML IJ SOLN: 4.0000 mg | Freq: Four times a day (QID) | INTRAMUSCULAR | Status: DC | PRN

## 2019-09-27 MED ORDER — SODIUM CHLORIDE 0.9 % IV SOLN
1.0000 g | INTRAVENOUS | Status: DC
  Administered 2019-09-28: 1 g via INTRAVENOUS
  Filled 2019-09-27: qty 10

## 2019-09-27 MED ORDER — HEPARIN SODIUM (PORCINE) 5000 UNIT/ML IJ SOLN
5000.0000 [IU] | Freq: Three times a day (TID) | INTRAMUSCULAR | Status: DC
  Administered 2019-09-27 – 2019-09-29 (×4): 5000 [IU] via SUBCUTANEOUS
  Filled 2019-09-27 (×4): qty 1

## 2019-09-27 NOTE — Progress Notes (Signed)
During report the ED RN told me that there had been several attempts at placing a PIV that were unsuccessful. She stated Multiple RNs had attempted and failed, the [facility] had attempted and failed, and attempts at placing an IV via ultrasound had failed. She said the MD was made aware and that vasuclar team would be coming in the morning to place IV access.

## 2019-09-27 NOTE — ED Notes (Signed)
Pt is a hard stick 

## 2019-09-27 NOTE — ED Provider Notes (Signed)
[facility] EMERGENCY DEPARTMENT Provider Note   CSN: [id number] Arrival date & time: 09/27/19  1249     History Chief Complaint  Patient presents with  . Weakness    Erica [name] is a 64 y.o. female presenting for evaluation of generalized weakness, nausea, vomiting.  Patient states last night she felt upper abdominal discomfort, nausea, vomiting, generalized weakness.  Patient states she is a dialysis patient, goes Tuesday, Thursday, Saturday, but did not go yesterday due to chronic leg pain.  Her leg pain is no different than normal.  Patient states she has not been able to eat any solid foods today.  She has not had much to drink.  She did vomit once today.  She has had a total of 5 episodes of emesis since last night.  She denies blood in her emesis.  She has fevers, chills, chest pain, shortness of breath, cough, abnormal bowel movements.  She occasionally produces some urine, states she had mild dysuria last night when she last passed urine.  She denies alcohol use, states she quit several years ago.  Additional history obtained from chart review.  Patient with a history of ESRD on dialysis, diabetes, CHF, GERD, hypertension, hyperlipidemia.  Seizure, stroke, chronic anemia  HPI     Past Medical History:  Diagnosis Date  . Alcohol-induced pancreatitis   . Chronic diarrhea   . Depression   . Diabetes mellitus    fasting blood sugar 110-120s  . Diastolic CHF ([place])   . DKA (diabetic ketoacidoses) ([place])   . Gastroparesis   . GERD (gastroesophageal reflux disease)   . Heart murmur   . History of kidney stones   . Hyperlipidemia   . Hypertension   . Hypokalemia   . Muscle spasm   . Neuropathic pain   . Neuropathy    Hx: of  . Pyelonephritis   . Seizures ([place])   . Vitamin B12 deficiency   . Vitamin D deficiency     Patient Active Problem List   Diagnosis Date Noted  . Closed fracture of right proximal humerus 04/20/2019  . Surgery, elective   . Seizure  ([place]) 11/12/2018  . History of hydronephrosis --stents in place 12/08/2017  . Stroke ([place]) 11/29/2017  . Seizures ([place])   . Hydronephrosis   . Recurrent UTI   . Diabetes mellitus type 2 in nonobese (HCC)   . ESRD (end stage renal disease) ([place])   . Anemia of chronic disease   . Chronic diastolic congestive heart failure ([place])   . Essential hypertension   . PICC (peripherally inserted central catheter) in place   . Acute pyelonephritis 11/12/2017  . Uncontrolled type 2 diabetes mellitus with hyperglycemia, with long-term current use of insulin ([place]) 11/10/2017  . Renal failure 11/08/2017  . Seizure disorder ([place]) 11/08/2017  . Orthostatic hypotension 07/25/2014  . Moderate protein malnutrition ([place]) 09/15/2013  . Aspiration pneumonia ([place]) 09/15/2013  . Acute respiratory failure requiring reintubation ([place]) 09/11/2013  . probable Seizures due to metabolic disorder 53/97/6734  . Lactic acidosis 03/19/2013  . Abdominal pain 03/19/2013  . Rotavirus infection 10/29/2012  . Type II or unspecified type diabetes mellitus without mention of complication, uncontrolled 10/29/2012  . Protein-calorie malnutrition, severe ([place]) 10/27/2012  . NSTEMI (non-ST elevated myocardial infarction) ([place]) 10/26/2012  . Fever, unspecified 10/26/2012  . Hypotension 10/25/2012  . Metabolic acidosis 19/37/9024  . Chronic diarrhea 10/25/2012  . Tobacco abuse 10/25/2012  . DKA (diabetic ketoacidoses) ([place]) 09/09/2012  . Dehydration 09/09/2012  . DKA,  type 2 ([place]) 05/20/2012  . Abnormal LFTs 05/20/2012  . Heart murmur, systolic 73/71/0626  . Hypoglycemia 07/08/2011  . Metabolic encephalopathy 94/85/4627  . Alcohol abuse 07/08/2011  . Hypokalemia 07/08/2011  . Nausea & vomiting 07/08/2011  . H/O chronic pancreatitis 07/08/2011    Past Surgical History:  Procedure Laterality Date  . AV FISTULA PLACEMENT Left 12/15/2017   Procedure: INSERTION OF ARTERIOVENOUS (AV) GORE-TEX GRAFT ARM;  Surgeon: Rosetta Posner, MD;  Location: [place];  Service: Vascular;  Laterality: Left;  . CATARACT EXTRACTION W/PHACO Right 03/14/2015   Procedure: CATARACT EXTRACTION PHACO AND INTRAOCULAR LENS PLACEMENT (IOC);  Surgeon: Baruch Goldmann, MD;  Location: [facility];  Service: Ophthalmology;  Laterality: Right;  CDE:11.13  . CATARACT EXTRACTION W/PHACO Left 04/11/2015   Procedure: CATARACT EXTRACTION PHACO AND INTRAOCULAR LENS PLACEMENT LEFT EYE CDE=9.68;  Surgeon: Baruch Goldmann, MD;  Location: [facility];  Service: Ophthalmology;  Laterality: Left;  . COLONOSCOPY  02/24/2010  . cystoscopy with ureteral stent  Bilateral 10/07/2017   At [place] CV LINE RIGHT  11/17/2017  . IR FLUORO GUIDE CV LINE RIGHT  11/22/2017  . IR REMOVAL TUN CV CATH W/O FL  01/26/2018  . IR US GUIDE VASC ACCESS RIGHT  11/17/2017  . MULTIPLE EXTRACTIONS WITH ALVEOLOPLASTY N/A 06/13/2012   Procedure: MULTIPLE EXTRACION WITH ALVEOLOPLASTY EXTRACT: 18, 19, 20, 21, 22, 24, 25, 27, 28, 29, 30, 31;  Surgeon: Gae Bon, DDS;  Location: [place];  Service: Oral Surgery;  Laterality: N/A;  . ORIF HUMERUS FRACTURE Right 04/20/2019   Procedure: OPEN REDUCTION INTERNAL FIXATION (ORIF) PROXIMAL HUMERUS FRACTURE;  Surgeon: Marchia Bond, MD;  Location: [place];  Service: Orthopedics;  Laterality: Right;  . TUBAL LIGATION    . URETERAL STENT PLACEMENT  09/2017     OB History    Gravida      Para      Term      Preterm      AB      Living  2     SAB      TAB      Ectopic      Multiple      Live Births              Family History  Problem Relation Age of Onset  . Diabetes Sister   . Chronic Renal Failure Neg Hx     Social History   Tobacco Use  . Smoking status: Never Smoker  . Smokeless tobacco: Current User    Types: Snuff  Vaping Use  . Vaping Use: Never used  Substance Use Topics  . Alcohol use: Not Currently    Alcohol/week: 1.0 standard drink    Types: 1 Cans of beer per week  . Drug use: No     Home Medications Prior to Admission medications   Medication Sig Start Date End Date Taking? Authorizing Provider  acetaminophen (TYLENOL) 325 MG tablet Take 650 mg by mouth every 4 (four) hours as needed for moderate pain.   Yes [provider]  carvedilol (COREG) 6.25 MG tablet Take 1 tablet (6.25 mg total) by mouth 2 (two) times daily. 11/15/18  Yes Buriev, Arie Sabina, MD  cinacalcet (SENSIPAR) 30 MG tablet Take 30 mg by mouth every evening.   Yes [provider]  Emollient (CETAPHIL) cream Apply 1 application topically every 12 (twelve) hours as needed (dry skin).   Yes [provider]  ferric citrate (  AURYXIA) 1 GM 210 MG(Fe) tablet Take 2 tablets (420 mg total) by mouth 3 (three) times daily with meals. 11/15/18  Yes Buriev, Arie Sabina, MD  folic acid (FOLVITE) 1 MG tablet Take 1 mg by mouth every morning.   Yes [provider]  insulin lispro (HUMALOG) 100 UNIT/ML injection Inject 2-10 Units into the skin 4 (four) times daily. Per sliding scale: CBG 151-200 2 units, 201-250 4 units, 251-300 6 units, 301-350 8 units, 351-400 10 units,   Yes [provider]  levETIRAcetam (KEPPRA) 1000 MG tablet Take 1,000 mg by mouth every morning.   Yes [provider]  NIFEdipine (PROCARDIA-XL/NIFEDICAL-XL) 30 MG 24 hr tablet Take 30 mg by mouth every morning. 07/10/19  Yes [provider]  ondansetron (ZOFRAN) 4 MG tablet Take 4 mg by mouth every 8 (eight) hours as needed for nausea/vomiting. 09/14/19  Yes [provider]  Oxycodone HCl 10 MG TABS Take 1 mg by mouth daily as needed (pain).  08/09/19  Yes [provider]  phenytoin (DILANTIN INFATABS) 50 MG tablet Chew 200 mg by mouth 2 (two) times daily.    Yes [provider]  rOPINIRole (REQUIP) 1 MG tablet Take 1 mg by mouth 3 (three) times daily.   Yes [provider]  senna (SENOKOT) 8.6 MG TABS tablet Take 8.6 mg by mouth in the morning and at bedtime.    Yes [provider]  sodium bicarbonate 650 MG tablet Take 650 mg by mouth 4 (four) times daily. For indigestion - midnight, 6am, noon and 6pm   Yes [provider]  topiramate (TOPAMAX) 25 MG tablet Take 1 tablet (25 mg total) by mouth 2 (two) times daily. 11/29/17 11/12/19 Yes Arrien, Jimmy Picket, MD  albuterol [facility] HFA) 108 [phone] Base) MCG/ACT inhaler Inhale 2 puffs into the lungs every 4 (four) hours as needed for wheezing or shortness of breath. Patient not taking: Reported on 08/10/2019    [provider]  calcitRIOL (ROCALTROL) 0.25 MCG capsule Take 1 capsule (0.25 mcg total) by mouth Every Tuesday,Thursday,and Saturday with dialysis. 12/18/17   Love, Ivan Anchors, PA-C  carboxymethylcellulose (ARTIFICIAL TEARS) 1 % ophthalmic solution Place 1 drop into both eyes daily as needed (dry eyes).    [provider]  dextrose (GLUTOSE) 40 % GEL Take 1 Tube by mouth every 10 (ten) minutes as needed for low blood sugar (unitl blood sugar is above 70).    [provider]  loperamide (IMODIUM) 2 MG capsule Take 4 mg by mouth daily as needed for diarrhea or loose stools.    [provider]  polyethylene glycol (MIRALAX) 17 g packet Take 17 g by mouth daily. 08/10/19   Tacy Learn, PA-C  Vitamin D, Ergocalciferol, (DRISDOL) 1.25 MG (50000 UT) CAPS capsule Take 50,000 Units by mouth every Friday.    [provider]    Allergies    Aspirin  Review of Systems   Review of Systems  Gastrointestinal: Positive for nausea and vomiting.  Genitourinary: Positive for dysuria.  Neurological: Positive for weakness.  All other systems reviewed and are negative.   Physical Exam Updated Vital Signs BP (!) 156/75   Pulse 76   Temp 98.7 F (37.1 C) (Oral)   Resp 14   SpO2 98%   Physical Exam Vitals and nursing note reviewed.  Constitutional:      General: She is not in acute distress.    Appearance: She is well-developed.     Comments:  Appears  chronically ill, nontoxic, exam  HENT:     Head: Normocephalic and atraumatic.  Eyes:     Extraocular Movements: Extraocular movements intact.     Conjunctiva/sclera: Conjunctivae normal.     Pupils: Pupils are equal, round, and reactive to light.  Cardiovascular:     Rate and Rhythm: Normal rate and regular rhythm.     Pulses: Normal pulses.     Comments: Fistula with good thrill Pulmonary:     Effort: Pulmonary effort is normal. No respiratory distress.     Breath sounds: Normal breath sounds. No wheezing.     Comments: Clear lung sounds Abdominal:     General: There is no distension.     Palpations: Abdomen is soft. There is no mass.     Tenderness: There is abdominal tenderness. There is no rebound.     Comments: Tenderness palpation of epigastric abdomen.  No rigidity, guarding or distention.  Negative rebound.  No peritonitis.  Musculoskeletal:        General: Normal range of motion.     Cervical back: Normal range of motion and neck supple.     Right lower leg: No edema.     Left lower leg: No edema.     Comments: No appreciable leg swelling or edema  Skin:    General: Skin is warm and dry.     Capillary Refill: Capillary refill takes less than 2 seconds.  Neurological:     Mental Status: She is alert and oriented to person, place, and time.     ED Results / Procedures / Treatments   Labs (all labs ordered are listed, but only abnormal results are displayed) Labs Reviewed  CBC WITH DIFFERENTIAL/PLATELET - Abnormal; Notable for the following components:      Result Value   RBC 3.23 (*)    Hemoglobin 11.0 (*)    HCT 33.5 (*)    MCV 103.7 (*)    MCH 34.1 (*)    All other components within normal limits  COMPREHENSIVE METABOLIC PANEL - Abnormal; Notable for the following components:   Sodium 121 (*)    Chloride 86 (*)    CO2 21 (*)    Glucose, Bld 124 (*)    BUN 43 (*)    Creatinine, Ser 5.64 (*)    Calcium 8.0 (*)    GFR calc non Af Amer 7 (*)    GFR  calc Af Amer 9 (*)    All other components within normal limits  CBG MONITORING, ED - Abnormal; Notable for the following components:   Glucose-Capillary 133 (*)    All other components within normal limits  URINE CULTURE  SARS CORONAVIRUS 2 BY RT PCR (HOSPITAL ORDER, [place] [facility])  LIPASE, BLOOD  URINALYSIS, ROUTINE W REFLEX MICROSCOPIC    EKG EKG Interpretation  Date/Time:  Wednesday September 27 2019 13:26:35 EDT Ventricular Rate:  93 PR Interval:    QRS Duration: 76 QT Interval:  374 QTC Calculation: 466 R Axis:   -12 Text Interpretation: Sinus rhythm Low voltage, extremity leads No significant change since 09/27/2019 Confirmed by Veryl Speak [id number]) on 09/27/2019 2:45:15 PM   Radiology DG Chest 2 View  Result Date: 09/27/2019 CLINICAL DATA:  Shortness of breath, weakness. Additional provided: Dialysis patient with nausea and vomiting for 2 days, missed dialysis this week due to nausea and vomiting. EXAM: CHEST - 2 VIEW COMPARISON:  Prior chest radiographs 07/08/2019 and earlier. FINDINGS: Unchanged cardiomegaly. No appreciable airspace consolidation or pulmonary  edema. No evidence of pleural effusion or pneumothorax. No acute bony abnormality identified. Sequela of prior ORIF of the proximal right humerus, incompletely imaged. Overlying cardiac monitoring leads. IMPRESSION: Unchanged cardiomegaly. No appreciable airspace consolidation or pulmonary edema. Electronically Signed   By: Kellie Simmering DO   On: 09/27/2019 14:56    Procedures Procedures (including critical care time)  Medications Ordered in ED Medications  sodium chloride 0.9 % bolus 250 mL (250 mLs Intravenous New Bag/Given 09/27/19 1541)  ondansetron (ZOFRAN) injection 4 mg (4 mg Intravenous Given 09/27/19 1541)  morphine 2 MG/ML injection 2 mg (2 mg Intravenous Given 09/27/19 1545)    ED Course  I have reviewed the triage vital signs and the nursing notes.  Pertinent labs & imaging results  that were available during my care of the patient were reviewed by me and considered in my medical decision making (see chart for details).    MDM Rules/Calculators/A&P                          Patient presented for evaluation of weakness, nausea, vomiting, epigastric abdominal pain.  On exam, patient appears chronically ill, but not acutely toxic today.  She has mild epigastric tenderness.  She missed dialysis yesterday, but does not appear fluid overloaded, no pitting edema or respiratory symptoms.  Will obtain labs, chest x-ray, urine.patient given Zofran and a small 250 cc bolus of NS.  Labs show hyponatremia at 121.  This is acutely different from last month and was 135.  BUN is elevated, favor dehydration in the setting of nausea and vomiting, despite missing yesterday's dialysis session.  Urine shows many bacteria, greater than 50 white cells, moderate leuks, likely UTI.  This may also be contributing to dehydration.  Chest x-ray viewed interpreted by me, shows cardiomegaly, but no obvious infection, pneumothorax, or effusion.  Agree patient would benefit from admission due to hyponatremia for gentle rehydration.  Will call for admission.  Discussed with Dr. Denton Brick from [facility] hospitalist service, requesting nephrology consult prior to admission.  Discussed with Dr. Arty Baumgartner from nephrology who recommends gentle hydration with normal saline with plan for dialysis tomorrow.  Discussed with Dr. Denton Brick, patient to be admitted.  Final Clinical Impression(s) / ED Diagnoses Final diagnoses:  Hyponatremia  Generalized weakness  Non-intractable vomiting with nausea, unspecified vomiting type    Rx / DC Orders ED Discharge Orders    None       Franchot Heidelberg, PA-C 09/27/19 1645    Veryl Speak, MD 09/28/19 1427

## 2019-09-27 NOTE — ED Notes (Signed)
Aug 7th was last day dialysis was completed. Bruit and thrill present to left upper arm.

## 2019-09-27 NOTE — ED Triage Notes (Signed)
EMS reports generalized weakness, n/v.  Pt missed dialysis yesterday due to leg pain.  VSS per ems.

## 2019-09-27 NOTE — H&P (Signed)
History and Physical    Erica [name] SNK:[id number] DOB: 03-03-1955 DOA: 09/27/2019  PCP: Scotty Court, DO   Patient coming from: Home  I have personally briefly reviewed patient's old medical records in [place]  Chief Complaint: Nausea vomiting weakness.  HPI: Erica [name] is a 64 y.o. female with medical history significant for ESRD, diabetes mellitus, seizures, diastolic CHF, hypertension and stroke. Patient presented to the ED today with complaints of onset of nausea vomiting generalized weakness that started about 5 AM this morning.  Patient reports about 4-5 episodes of vomiting today, nonbloody.  Also poor p.o. intake over the past few days.  She reports diffuse mild abdominal pain.  No loose stools.  No diarrhea.  Patient has completed her Covid vaccine. She reports chronic dysuria since she started dialysis in 2019.  She makes very little amount of urine. Patient's HD schedule is Tuesday Thursday Saturday.  Her last dialysis session was on Saturday - 8/7.  She missed yesterday's session because she was feeling poorly.  She tells me she is usually compliant with her HD sessions.  ED Course: Stable vitals.  Sodium 121.  BUN 43.  WBC 9.1.  Two-view chest x-ray without acute abnormality.  UA shows moderate leukocytes, greater than 50 WBC and many bacteria.  250 mill bolus given IV ceftriaxone ordered. EDP to nephrology, patient to be dialyzed in the morning, give gentle IV fluids.  Review of Systems: As per HPI all other systems reviewed and negative.  Past Medical History:  Diagnosis Date  . Alcohol-induced pancreatitis   . Chronic diarrhea   . Depression   . Diabetes mellitus    fasting blood sugar 110-120s  . Diastolic CHF ([place])   . DKA (diabetic ketoacidoses) ([place])   . Gastroparesis   . GERD (gastroesophageal reflux disease)   . Heart murmur   . History of kidney stones   . Hyperlipidemia   . Hypertension   . Hypokalemia   . Muscle spasm   .  Neuropathic pain   . Neuropathy    Hx: of  . Pyelonephritis   . Seizures ([place])   . Vitamin B12 deficiency   . Vitamin D deficiency     Past Surgical History:  Procedure Laterality Date  . AV FISTULA PLACEMENT Left 12/15/2017   Procedure: INSERTION OF ARTERIOVENOUS (AV) GORE-TEX GRAFT ARM;  Surgeon: Rosetta Posner, MD;  Location: [place];  Service: Vascular;  Laterality: Left;  . CATARACT EXTRACTION W/PHACO Right 03/14/2015   Procedure: CATARACT EXTRACTION PHACO AND INTRAOCULAR LENS PLACEMENT (IOC);  Surgeon: Baruch Goldmann, MD;  Location: [facility];  Service: Ophthalmology;  Laterality: Right;  CDE:11.13  . CATARACT EXTRACTION W/PHACO Left 04/11/2015   Procedure: CATARACT EXTRACTION PHACO AND INTRAOCULAR LENS PLACEMENT LEFT EYE CDE=9.68;  Surgeon: Baruch Goldmann, MD;  Location: [facility];  Service: Ophthalmology;  Laterality: Left;  . COLONOSCOPY  02/24/2010  . cystoscopy with ureteral stent  Bilateral 10/07/2017   At [place] CV LINE RIGHT  11/17/2017  . IR FLUORO GUIDE CV LINE RIGHT  11/22/2017  . IR REMOVAL TUN CV CATH W/O FL  01/26/2018  . IR US GUIDE VASC ACCESS RIGHT  11/17/2017  . MULTIPLE EXTRACTIONS WITH ALVEOLOPLASTY N/A 06/13/2012   Procedure: MULTIPLE EXTRACION WITH ALVEOLOPLASTY EXTRACT: 18, 19, 20, 21, 22, 24, 25, 27, 28, 29, 30, 31;  Surgeon: Gae Bon, DDS;  Location: [place];  Service: Oral Surgery;  Laterality: N/A;  . ORIF  HUMERUS FRACTURE Right 04/20/2019   Procedure: OPEN REDUCTION INTERNAL FIXATION (ORIF) PROXIMAL HUMERUS FRACTURE;  Surgeon: Marchia Bond, MD;  Location: [place];  Service: Orthopedics;  Laterality: Right;  . TUBAL LIGATION    . URETERAL STENT PLACEMENT  09/2017     reports that she has never smoked. Her smokeless tobacco use includes snuff. She reports previous alcohol use of about 1.0 standard drink of alcohol per week. She reports that she does not use drugs.  Allergies  Allergen Reactions  . Aspirin Palpitations    Listed on  [facility] 11/12/18    Family History  Problem Relation Age of Onset  . Diabetes Sister   . Chronic Renal Failure Neg Hx     Prior to Admission medications   Medication Sig Start Date End Date Taking? Authorizing Provider  acetaminophen (TYLENOL) 325 MG tablet Take 650 mg by mouth every 4 (four) hours as needed for moderate pain.   Yes [provider]  carvedilol (COREG) 6.25 MG tablet Take 1 tablet (6.25 mg total) by mouth 2 (two) times daily. 11/15/18  Yes Buriev, Arie Sabina, MD  cinacalcet (SENSIPAR) 30 MG tablet Take 30 mg by mouth every evening.   Yes [provider]  Emollient (CETAPHIL) cream Apply 1 application topically every 12 (twelve) hours as needed (dry skin).   Yes [provider]  ferric citrate (AURYXIA) 1 GM 210 MG(Fe) tablet Take 2 tablets (420 mg total) by mouth 3 (three) times daily with meals. 11/15/18  Yes Buriev, Arie Sabina, MD  folic acid (FOLVITE) 1 MG tablet Take 1 mg by mouth every morning.   Yes [provider]  insulin lispro (HUMALOG) 100 UNIT/ML injection Inject 2-10 Units into the skin 4 (four) times daily. Per sliding scale: CBG 151-200 2 units, 201-250 4 units, 251-300 6 units, 301-350 8 units, 351-400 10 units,   Yes [provider]  levETIRAcetam (KEPPRA) 1000 MG tablet Take 1,000 mg by mouth every morning.   Yes [provider]  NIFEdipine (PROCARDIA-XL/NIFEDICAL-XL) 30 MG 24 hr tablet Take 30 mg by mouth every morning. 07/10/19  Yes [provider]  ondansetron (ZOFRAN) 4 MG tablet Take 4 mg by mouth every 8 (eight) hours as needed for nausea/vomiting. 09/14/19  Yes [provider]  Oxycodone HCl 10 MG TABS Take 1 mg by mouth daily as needed (pain).  08/09/19  Yes [provider]  phenytoin (DILANTIN INFATABS) 50 MG tablet Chew 200 mg by mouth 2 (two) times daily.    Yes [provider]  rOPINIRole (REQUIP) 1 MG tablet Take 1 mg by mouth 3 (three) times daily.   Yes [provider]  senna (SENOKOT) 8.6 MG TABS tablet Take 8.6 mg by mouth in the morning and at bedtime.   Yes [provider]  sodium bicarbonate 650 MG tablet Take 650 mg by mouth 4 (four) times daily. For indigestion - midnight, 6am, noon and 6pm   Yes [provider]  topiramate (TOPAMAX) 25 MG tablet Take 1 tablet (25 mg total) by mouth 2 (two) times daily. 11/29/17 11/12/19 Yes Arrien, Jimmy Picket, MD  albuterol [facility] HFA) [phone] Base) MCG/ACT inhaler Inhale 2 puffs into the lungs every 4 (four) hours as needed for wheezing or shortness of breath. Patient not taking: Reported on 08/10/2019    [provider]  calcitRIOL (ROCALTROL) 0.25 MCG capsule Take 1 capsule (0.25 mcg total) by mouth Every Tuesday,Thursday,and Saturday with dialysis. 12/18/17   Bary Leriche, PA-C  carboxymethylcellulose (ARTIFICIAL TEARS) 1 % ophthalmic solution Place 1 drop into both eyes daily as needed (dry eyes).    [provider]  dextrose (GLUTOSE) 40 % GEL Take 1 Tube by mouth every 10 (ten) minutes as needed for low blood sugar (unitl blood sugar is above 70).    [provider]  loperamide (IMODIUM) 2 MG capsule Take 4 mg by mouth daily as needed for diarrhea or loose stools.    [provider]  polyethylene glycol (MIRALAX) 17 g packet Take 17 g by mouth daily. 08/10/19   Tacy Learn, PA-C  Vitamin D, Ergocalciferol, (DRISDOL) 1.25 MG (50000 UT) CAPS capsule Take 50,000 Units by mouth every Friday.    [provider]    Physical Exam: Vitals:   09/27/19 1533 09/27/19 1602 09/27/19 1617  BP: (!) 156/75 (!) 169/74   Pulse: 76 76 72  Resp: 14 12 12   Temp: 59.5 F (37.1 C)    TempSrc: Oral    SpO2: 98% 97% 96%    Constitutional: NAD, calm, comfortable Vitals:   09/27/19 1533 09/27/19 1602 09/27/19 1617  BP: (!) 156/75 (!) 169/74   Pulse: 76 76 72  Resp: 14 12 12   Temp: 98.7 F (37.1 C)    TempSrc: Oral    SpO2: 98% 97% 96%    Eyes: PERRL, lids and conjunctivae normal ENMT: Mucous membranes are dry, with some dark coating from snuff that patient takes. Neck: normal, supple, no masses, no thyromegaly Respiratory: clear to auscultation bilaterally, no wheezing, no crackles. Normal respiratory effort. No accessory muscle use.  Cardiovascular: Regular rate and rhythm, no murmurs / rubs / gallops. No extremity edema. 2+ pedal pulses.  Abdomen: no tenderness, no masses palpated. No hepatosplenomegaly. Bowel sounds positive.  Musculoskeletal: no clubbing / cyanosis. No joint deformity upper and lower extremities. Good ROM, no contractures. Normal muscle tone.  Skin: no rashes, lesions, ulcers. No induration Neurologic: 5/5 strength all extremities, no apparent cranial nerve abnormality. Psychiatric: Normal judgment and insight. Alert and oriented x 3. Normal mood.   Labs on Admission: I have personally reviewed following labs and imaging studies  CBC: Recent Labs  Lab 09/27/19 1406  WBC 9.1  NEUTROABS 6.8  HGB 11.0*  HCT 33.5*  MCV 103.7*  PLT 638   Basic Metabolic Panel: Recent Labs  Lab 09/27/19 1406  NA 121*  K 4.0  CL 86*  CO2 21*  GLUCOSE 124*  BUN 43*  CREATININE 5.64*  CALCIUM 8.0*   GFR: CrCl cannot be calculated (Unknown ideal weight.). Liver Function Tests: Recent Labs  Lab 09/27/19 1406  AST 27  ALT 30  ALKPHOS 115  BILITOT 0.9  PROT 7.5  ALBUMIN 3.9   Recent Labs  Lab 09/27/19 1406  LIPASE 15   CBG: Recent Labs  Lab 09/27/19 1305  GLUCAP 133*   Urine analysis:    Component Value Date/Time   COLORURINE YELLOW 09/27/2019 1310   APPEARANCEUR TURBID (A) 09/27/2019 1310   LABSPEC 1.013 09/27/2019 1310   PHURINE 7.0 09/27/2019 1310   GLUCOSEU 50 (A) 09/27/2019 1310   HGBUR SMALL (A) 09/27/2019 1310   BILIRUBINUR NEGATIVE 09/27/2019 1310   KETONESUR NEGATIVE 09/27/2019 1310   PROTEINUR >=300 (A) 09/27/2019 1310   UROBILINOGEN 0.2 07/25/2014 2309   NITRITE NEGATIVE  09/27/2019 1310   LEUKOCYTESUR MODERATE (A) 09/27/2019 1310    Radiological Exams on Admission: DG Chest 2 View  Result Date: 09/27/2019 CLINICAL DATA:  Shortness of breath, weakness. Additional provided: Dialysis  patient with nausea and vomiting for 2 days, missed dialysis this week due to nausea and vomiting. EXAM: CHEST - 2 VIEW COMPARISON:  Prior chest radiographs 07/08/2019 and earlier. FINDINGS: Unchanged cardiomegaly. No appreciable airspace consolidation or pulmonary edema. No evidence of pleural effusion or pneumothorax. No acute bony abnormality identified. Sequela of prior ORIF of the proximal right humerus, incompletely imaged. Overlying cardiac monitoring leads. IMPRESSION: Unchanged cardiomegaly. No appreciable airspace consolidation or pulmonary edema. Electronically Signed   By: Kellie Simmering DO   On: 09/27/2019 14:56    EKG: Independently reviewed.  Sinus rhythm, QTC 466.  No significant change from prior.  Assessment/Plan Principal Problem:   Hyponatremia Active Problems:   H/O chronic pancreatitis   Seizure disorder (HCC)   ESRD (end stage renal disease) ([place])   Hyponatremia-121, baseline mid 130s.  Likely from vomiting and poor p.o. intake.  Quit drinking alcohol several years ago.  - 268mls N/s bolus given, continue N/s 50cc/hr x 20hrs -Renal function panel in the morning  Vomiting, generalized weakness-reports mild diffuse abdominal pain, abdominal exam is benign.  Afebrile.  No leukocytosis.  Liver enzymes, lipase normal.  Vomiting may be related to her history of chronic pancreatitis.  UA showed suggesting UTI, but patient makes very little amount of urine and reports chronic dysuria, she may be colonized. - will manage vomiting conservatively for now, Zofran as needed -IV fluids -Clear liquid diet, advance as tolerated -IV ceftriaxone 1 g daily -Follow-up urine cultures  ESRD-Tuesday Thursday Saturday schedule.  Missed last HD session yesterday.  Potassium 4.   No sign of volume overload.  -EDP talked to Nephrology, to see tomorrow of dialysis, gentle for now. -Resume cinacalcet  Diabetes mellitus-random glucose 124. - SSI-ultrasensitive - Hgba1c  Hypertension-elevated. -Resume Coreg, nifedipine  History of seizures -Resume Topamax, Keppra, phenytoin  History of alcohol abuse-reports no alcohol intake in several years. -Blood alcohol level -Thiamine folate.  DVT prophylaxis: Heparin Code Status: Full code Family Communication: None at bedside Disposition Plan: 1 to 2 days Consults called: Nephrology Admission status: Observation, telemetry.   Bethena Roys MD [facility]  09/27/2019, 6:39 PM

## 2019-09-27 NOTE — ED Notes (Signed)
SWOT and EDP informed pt IV has infiltrated and needs another ultrasound or a line put in for medications. EDP aware of the need for a line.

## 2019-09-27 NOTE — ED Notes (Signed)
Pt IV site infiltrated, ice applied.

## 2019-09-28 DIAGNOSIS — E1122 Type 2 diabetes mellitus with diabetic chronic kidney disease: Secondary | ICD-10-CM | POA: Diagnosis present

## 2019-09-28 DIAGNOSIS — N2581 Secondary hyperparathyroidism of renal origin: Secondary | ICD-10-CM | POA: Diagnosis present

## 2019-09-28 DIAGNOSIS — K219 Gastro-esophageal reflux disease without esophagitis: Secondary | ICD-10-CM | POA: Diagnosis present

## 2019-09-28 DIAGNOSIS — N186 End stage renal disease: Secondary | ICD-10-CM | POA: Diagnosis present

## 2019-09-28 DIAGNOSIS — I5032 Chronic diastolic (congestive) heart failure: Secondary | ICD-10-CM | POA: Diagnosis present

## 2019-09-28 DIAGNOSIS — G40909 Epilepsy, unspecified, not intractable, without status epilepticus: Secondary | ICD-10-CM | POA: Diagnosis present

## 2019-09-28 DIAGNOSIS — Z8673 Personal history of transient ischemic attack (TIA), and cerebral infarction without residual deficits: Secondary | ICD-10-CM | POA: Diagnosis not present

## 2019-09-28 DIAGNOSIS — R531 Weakness: Secondary | ICD-10-CM | POA: Diagnosis present

## 2019-09-28 DIAGNOSIS — E869 Volume depletion, unspecified: Secondary | ICD-10-CM | POA: Diagnosis present

## 2019-09-28 DIAGNOSIS — K3184 Gastroparesis: Secondary | ICD-10-CM | POA: Diagnosis present

## 2019-09-28 DIAGNOSIS — E785 Hyperlipidemia, unspecified: Secondary | ICD-10-CM | POA: Diagnosis present

## 2019-09-28 DIAGNOSIS — Z79899 Other long term (current) drug therapy: Secondary | ICD-10-CM | POA: Diagnosis not present

## 2019-09-28 DIAGNOSIS — K861 Other chronic pancreatitis: Secondary | ICD-10-CM | POA: Diagnosis present

## 2019-09-28 DIAGNOSIS — Z833 Family history of diabetes mellitus: Secondary | ICD-10-CM | POA: Diagnosis not present

## 2019-09-28 DIAGNOSIS — I132 Hypertensive heart and chronic kidney disease with heart failure and with stage 5 chronic kidney disease, or end stage renal disease: Secondary | ICD-10-CM | POA: Diagnosis present

## 2019-09-28 DIAGNOSIS — E1143 Type 2 diabetes mellitus with diabetic autonomic (poly)neuropathy: Secondary | ICD-10-CM | POA: Diagnosis present

## 2019-09-28 DIAGNOSIS — Z886 Allergy status to analgesic agent status: Secondary | ICD-10-CM | POA: Diagnosis not present

## 2019-09-28 DIAGNOSIS — E871 Hypo-osmolality and hyponatremia: Principal | ICD-10-CM

## 2019-09-28 DIAGNOSIS — E538 Deficiency of other specified B group vitamins: Secondary | ICD-10-CM | POA: Diagnosis present

## 2019-09-28 DIAGNOSIS — Z720 Tobacco use: Secondary | ICD-10-CM | POA: Diagnosis not present

## 2019-09-28 DIAGNOSIS — E559 Vitamin D deficiency, unspecified: Secondary | ICD-10-CM | POA: Diagnosis present

## 2019-09-28 DIAGNOSIS — F329 Major depressive disorder, single episode, unspecified: Secondary | ICD-10-CM | POA: Diagnosis present

## 2019-09-28 DIAGNOSIS — N12 Tubulo-interstitial nephritis, not specified as acute or chronic: Secondary | ICD-10-CM | POA: Diagnosis present

## 2019-09-28 DIAGNOSIS — Z87442 Personal history of urinary calculi: Secondary | ICD-10-CM | POA: Diagnosis not present

## 2019-09-28 DIAGNOSIS — Z20822 Contact with and (suspected) exposure to covid-19: Secondary | ICD-10-CM | POA: Diagnosis present

## 2019-09-28 LAB — HEMOGLOBIN A1C
Hgb A1c MFr Bld: 6.2 % — ABNORMAL HIGH (ref 4.8–5.6)
Mean Plasma Glucose: 131.24 mg/dL

## 2019-09-28 LAB — GLUCOSE, CAPILLARY
Glucose-Capillary: 315 mg/dL — ABNORMAL HIGH (ref 70–99)
Glucose-Capillary: 179 mg/dL — ABNORMAL HIGH (ref 70–99)

## 2019-09-28 LAB — RENAL FUNCTION PANEL
Albumin: 3 g/dL — ABNORMAL LOW (ref 3.5–5.0)
Anion gap: 14 (ref 5–15)
BUN: 48 mg/dL — ABNORMAL HIGH (ref 8–23)
CO2: 23 mmol/L (ref 22–32)
Calcium: 7.9 mg/dL — ABNORMAL LOW (ref 8.9–10.3)
Chloride: 92 mmol/L — ABNORMAL LOW (ref 98–111)
Creatinine, Ser: 6.21 mg/dL — ABNORMAL HIGH (ref 0.44–1.00)
GFR calc Af Amer: 8 mL/min — ABNORMAL LOW (ref >60)
GFR calc non Af Amer: 7 mL/min — ABNORMAL LOW (ref >60)
Glucose, Bld: 136 mg/dL — ABNORMAL HIGH (ref 70–99)
Phosphorus: 6.1 mg/dL — ABNORMAL HIGH (ref 2.5–4.6)
Potassium: 4.1 mmol/L (ref 3.5–5.1)
Sodium: 129 mmol/L — ABNORMAL LOW (ref 135–145)

## 2019-09-28 LAB — URINE CULTURE: Culture: NO GROWTH

## 2019-09-28 MED ORDER — LIDOCAINE HCL (PF) 1 % IJ SOLN: 5.0000 mL | INTRAMUSCULAR | Status: DC | PRN

## 2019-09-28 MED ORDER — LIDOCAINE-PRILOCAINE 2.5-2.5 % EX CREA: 1.0000 "application " | TOPICAL_CREAM | CUTANEOUS | Status: DC | PRN

## 2019-09-28 MED ORDER — INSULIN ASPART 100 UNIT/ML ~~LOC~~ SOLN
0.0000 [IU] | Freq: Three times a day (TID) | SUBCUTANEOUS | Status: DC
  Administered 2019-09-28: 4 [IU] via SUBCUTANEOUS
  Administered 2019-09-29: 1 [IU] via SUBCUTANEOUS

## 2019-09-28 MED ORDER — SODIUM CHLORIDE 0.9 % IV SOLN: 100.0000 mL | INTRAVENOUS | Status: DC | PRN

## 2019-09-28 MED ORDER — PENTAFLUOROPROP-TETRAFLUOROETH EX AERO: 1.0000 "application " | INHALATION_SPRAY | CUTANEOUS | Status: DC | PRN

## 2019-09-28 MED ORDER — CHLORHEXIDINE GLUCONATE CLOTH 2 % EX PADS
6.0000 | MEDICATED_PAD | Freq: Every day | CUTANEOUS | Status: DC
  Administered 2019-09-29: 6 via TOPICAL

## 2019-09-28 NOTE — Consult Note (Signed)
[facility] Renal Consultation Note    Indication for Consultation:  Management of ESRD/hemodialysis, anemia, hypertension/volume, hyponatremia and secondary hyperparathyroidism. PCP:  HPI: Erica [name] is a 64 y.o. female with a history of ESRD on dialysis at [facility] (TTS), BM, CHF, HTN, seizure disorder, and remote alcohol induced pancreatitis, who presented to the emergency department because of nausea, vomiting, weakness.  Initially the patient says that her symptoms started yesterday.  She was not having diarrhea but having multiple episodes of emesis.  However, the patient tells me now that some of the symptoms including poor p.o. intake and nausea have been going on for 4 to 5 days.  She also has had some diffuse abdominal pain.  Because of some of the symptoms she missed dialysis on Tuesday.  Her last full dialysis session was on Saturday.  She says that dialysis has been going well without significant issues.    On admission she was found to have a sodium of 121.  It was felt that she was likely dehydrated with poor p.o. intake and was given fluids.  Her sodium was 129 today.  On interview today the patient states that she feels much better.  She denies significant nausea or vomiting since admission.  Past Medical History:  Diagnosis Date  . Alcohol-induced pancreatitis   . Chronic diarrhea   . Depression   . Diabetes mellitus    fasting blood sugar 110-120s  . Diastolic CHF ([place])   . DKA (diabetic ketoacidoses) ([place])   . Gastroparesis   . GERD (gastroesophageal reflux disease)   . Heart murmur   . History of kidney stones   . Hyperlipidemia   . Hypertension   . Hypokalemia   . Muscle spasm   . Neuropathic pain   . Neuropathy    Hx: of  . Pyelonephritis   . Seizures ([place])   . Vitamin B12 deficiency   . Vitamin D deficiency    Past Surgical History:  Procedure Laterality Date  . AV FISTULA PLACEMENT Left 12/15/2017   Procedure: INSERTION OF  ARTERIOVENOUS (AV) GORE-TEX GRAFT ARM;  Surgeon: Rosetta Posner, MD;  Location: [place];  Service: Vascular;  Laterality: Left;  . CATARACT EXTRACTION W/PHACO Right 03/14/2015   Procedure: CATARACT EXTRACTION PHACO AND INTRAOCULAR LENS PLACEMENT (IOC);  Surgeon: Baruch Goldmann, MD;  Location[facility];  Service: Ophthalmology;  Laterality: Right;  CDE:11.13  . CATARACT EXTRACTION W/PHACO Left 04/11/2015   Procedure: CATARACT EXTRACTION PHACO AND INTRAOCULAR LENS PLACEMENT LEFT EYE CDE=9.68;  Surgeon: Baruch Goldmann, MD;  Location[facility];  Service: Ophthalmology;  Laterality: Left;  . COLONOSCOPY  02/24/2010  . cystoscopy with ureteral stent  Bilateral 10/07/2017   At [place] CV LINE RIGHT  11/17/2017  . IR FLUORO GUIDE CV LINE RIGHT  11/22/2017  . IR REMOVAL TUN CV CATH W/O FL  01/26/2018  . IR US GUIDE VASC ACCESS RIGHT  11/17/2017  . MULTIPLE EXTRACTIONS WITH ALVEOLOPLASTY N/A 06/13/2012   Procedure: MULTIPLE EXTRACION WITH ALVEOLOPLASTY EXTRACT: 18, 19, 20, 21, 22, 24, 25, 27, 28, 29, 30, 31;  Surgeon: Gae Bon, DDS;  Location: [place];  Service: Oral Surgery;  Laterality: N/A;  . ORIF HUMERUS FRACTURE Right 04/20/2019   Procedure: OPEN REDUCTION INTERNAL FIXATION (ORIF) PROXIMAL HUMERUS FRACTURE;  Surgeon: Marchia Bond, MD;  Location: [place];  Service: Orthopedics;  Laterality: Right;  . TUBAL LIGATION    . URETERAL STENT PLACEMENT  09/2017  Family History  Problem Relation Age of Onset  . Diabetes Sister   . Chronic Renal Failure Neg Hx    Social History:  reports that she has never smoked. Her smokeless tobacco use includes snuff. She reports previous alcohol use of about 1.0 standard drink of alcohol per week. She reports that she does not use drugs.  ROS: As per HPI otherwise negative.  Physical Exam: Vitals:   09/27/19 2000 09/27/19 2218 09/27/19 2303 09/28/19 0500  BP: (!) 165/71 (!) 154/84 (!) 153/68 (!) 179/64  Pulse:  86 72 66  Resp: 13 15 14 12    Temp:  98.2 F (36.8 C) 98.3 F (36.8 C) 97.8 F (36.6 C)  TempSrc:  Oral Oral Oral  SpO2: 96% 95% 99% 100%  Height:   4\' 11"  (1.499 m)      GEN: wdwn, sitting in bed, nad ENT: no nasal discharge, mmm EYES: no scleral icterus, eomi CV: normal rate, no murmurs PULM: no iwob, bilateral chest rise ABD: NABS, non-distended SKIN: no rashes or jaundice EXT: no edema, warm and well perfused Dialysis Access: LUE AVF + bruit  Allergies  Allergen Reactions  . Aspirin Palpitations    Listed on [facility] 11/12/18   Prior to Admission medications   Medication Sig Start Date End Date Taking? Authorizing Provider  acetaminophen (TYLENOL) 325 MG tablet Take 1-2 tablets (325-650 mg total) by mouth every 4 (four) hours as needed for mild pain. 12/16/17   Love, Ivan Anchors, PA-C  calcitRIOL (ROCALTROL) 0.25 MCG capsule Take 1 capsule (0.25 mcg total) by mouth Every Tuesday,Thursday,and Saturday with dialysis. 12/18/17   Love, Ivan Anchors, PA-C  carvedilol (COREG) 12.5 MG tablet Take 1 tablet (12.5 mg total) by mouth 2 (two) times daily with a meal. 12/16/17   Love, Ivan Anchors, PA-C  Chlorhexidine Gluconate Cloth 2 % PADS Apply 6 each topically daily at 6 (six) AM. 12/17/17   Love, Ivan Anchors, PA-C  ferric citrate (AURYXIA) 1 GM 210 MG(Fe) tablet Take 2 tablets (420 mg total) by mouth 3 (three) times daily with meals. 12/16/17   Love, Ivan Anchors, PA-C  insulin aspart (NOVOLOG) 100 UNIT/ML injection Inject 0-9 Units into the skin 3 (three) times daily with meals. For glucose 121-150 use one unit, for 151-200 use 2 units, for 201 to 250 use 3 units, for 251-300 use 5 units, for 301-350 use 7 units, for 351 or greater use 9 unnits. 11/29/17 12/29/17  Arrien, Jimmy Picket, MD  insulin glargine (LANTUS) 100 UNIT/ML injection Inject 0.12 mLs (12 Units total) into the skin daily. 12/17/17   Love, Ivan Anchors, PA-C  ondansetron (ZOFRAN ODT) 4 MG disintegrating tablet Take 1 tablet (4 mg total) by mouth every 8 (eight) hours as  needed for nausea. 10/18/18   Noemi Chapel, MD  phenytoin (DILANTIN) 125 MG/5ML suspension Take 6 mLs (150 mg total) by mouth 2 (two) times daily. 12/16/17   Love, Ivan Anchors, PA-C  topiramate (TOPAMAX) 25 MG tablet Take 1 tablet (25 mg total) by mouth 2 (two) times daily. 11/29/17 12/29/17  Arrien, Jimmy Picket, MD  traZODone (DESYREL) 50 MG tablet Take 0.5-1 tablets (25-50 mg total) by mouth at bedtime as needed for sleep. 12/16/17   Bary Leriche, PA-C   Current Facility-Administered Medications  Medication Dose Route Frequency Provider Last Rate Last Admin  . acetaminophen (TYLENOL) tablet 650 mg  650 mg Oral Q6H PRN Emokpae, Ejiroghene E, MD       Or  . acetaminophen (TYLENOL) suppository 650  mg  650 mg Rectal Q6H PRN Emokpae, Ejiroghene E, MD      . carvedilol (COREG) tablet 6.25 mg  6.25 mg Oral BID Emokpae, Ejiroghene E, MD   6.25 mg at 09/27/19 2022  . cefTRIAXone (ROCEPHIN) 1 g in sodium chloride 0.9 % 100 mL IVPB  1 g Intravenous Once Emokpae, Ejiroghene E, MD      . cefTRIAXone (ROCEPHIN) 1 g in sodium chloride 0.9 % 100 mL IVPB  1 g Intravenous Q24H Emokpae, Ejiroghene E, MD      . Chlorhexidine Gluconate Cloth 2 % PADS 6 each  6 each Topical Q0600 Reesa Chew, MD      . cinacalcet (SENSIPAR) tablet 30 mg  30 mg Oral QPM Emokpae, Ejiroghene E, MD   30 mg at 09/27/19 2339  . folic acid (FOLVITE) tablet 1 mg  1 mg Oral q morning - 10a Emokpae, Ejiroghene E, MD   1 mg at 09/28/19 0858  . heparin injection 5,000 Units  5,000 Units Subcutaneous Q8H Emokpae, Ejiroghene E, MD   5,000 Units at 09/27/19 2339  . insulin aspart (novoLOG) injection 0-6 Units  0-6 Units Subcutaneous TID WC Shah, Pratik D, DO      . levETIRAcetam (KEPPRA) tablet 1,000 mg  1,000 mg Oral q morning - 10a Emokpae, Ejiroghene E, MD   1,000 mg at 09/28/19 0858  . NIFEdipine (PROCARDIA-XL/NIFEDICAL-XL) 24 hr tablet 30 mg  30 mg Oral q morning - 10a Emokpae, Ejiroghene E, MD   30 mg at 09/28/19 0858  . ondansetron  (ZOFRAN) tablet 4 mg  4 mg Oral Q6H PRN Emokpae, Ejiroghene E, MD   4 mg at 09/27/19 2022   Or  . ondansetron (ZOFRAN) injection 4 mg  4 mg Intravenous Q6H PRN Emokpae, Ejiroghene E, MD      . phenytoin (DILANTIN) chewable tablet 200 mg  200 mg Oral BID Emokpae, Ejiroghene E, MD   200 mg at 09/28/19 0859  . polyethylene glycol (MIRALAX / GLYCOLAX) packet 17 g  17 g Oral Daily PRN Emokpae, Ejiroghene E, MD      . rOPINIRole (REQUIP) tablet 1 mg  1 mg Oral TID Emokpae, Ejiroghene E, MD   1 mg at 09/28/19 0858  . thiamine tablet 100 mg  100 mg Oral Daily Emokpae, Ejiroghene E, MD   100 mg at 09/28/19 0858  . topiramate (TOPAMAX) tablet 25 mg  25 mg Oral BID Emokpae, Ejiroghene E, MD   25 mg at 09/28/19 0857   Labs: Basic Metabolic Panel: Recent Labs  Lab 09/27/19 1406 09/28/19 0525  NA 121* 129*  K 4.0 4.1  CL 86* 92*  CO2 21* 23  GLUCOSE 124* 136*  BUN 43* 48*  CREATININE 5.64* 6.21*  CALCIUM 8.0* 7.9*  PHOS  --  6.1*   CBC: Recent Labs  Lab 09/27/19 1406  WBC 9.1  NEUTROABS 6.8  HGB 11.0*  HCT 33.5*  MCV 103.7*  PLT 174    Dialysis Orders: Center: [facility]  on TTS- last 10/24/2019. EDW 43.5kg, Time: 3.75 hours, 2K/ 2.5 Ca, BFR 400, DFR 600,  Dialyzer: [place] Revacir 200, LUE AVF Heparin 1000 unit load, 500 units/hour (stop 60 min before end of HD)  Assessment/Plan: 1. Hyponatremia: Likely associated with poor p.o. intake resolved with hydration.  Sodium increased by 8 points in the past 24 hours.  We will use a low sodium dialysate (130) to prevent significant increase above this.  Repeat sodium at 2:00 today.  If sodium  remains at 130 when she completes dialysis she is likely stable for discharge. 2.  ESRD:  TTS [place], last HD was 10/24/2019.  Missed dialysis on Tuesday because of her symptoms of nausea.  We will plan to dialyze today full session removing 2 L and using low sodium as above. 3.  Hypertension/volume: Hypertensive at this time.  Plan for 2 L UF as above.   Continue home medications for blood pressure. 4.  Anemia: Hemoglobin 11. No indication for ESA at this time.  5.  Metabolic bone disease: Calcium 7.9 but corrects to near normal when accounting for albumin.  Phosphorus 6.1.  Can hold ferric citrate and resume on discharge.  Continue home Sensipar and calcitriol.  No need to continue home bicarb at this time.

## 2019-09-28 NOTE — Procedures (Signed)
     HEMODIALYSIS TREATMENT NOTE:   3.75 hour heparin-free treatment completed via LUE AVG (15g ante/retrograde).  Base sodium lowered to 130 for hyponatremia as ordered.  Goal met: 2 liters removed without interruption in UF.  All blood was returned and hemostasis was achieved in 15 minutes.  No changes from pre-HD assessment.   , RN 

## 2019-09-28 NOTE — Progress Notes (Signed)
PROGRESS NOTE    Erica [name]  DXA:[id number] DOB: [birth year] DOA: 09/27/2019 PCP: Scotty Court, DO   Brief Narrative:  Per HPI: Erica [name] is a 64 y.o. female with medical history significant for ESRD, diabetes mellitus, seizures, diastolic CHF, hypertension and stroke. Patient presented to the ED today with complaints of onset of nausea vomiting generalized weakness that started about 5 AM this morning.  Patient reports about 4-5 episodes of vomiting today, nonbloody.  Also poor p.o. intake over the past few days.  She reports diffuse mild abdominal pain.  No loose stools.  No diarrhea.  Patient has completed her Covid vaccine. She reports chronic dysuria since she started dialysis in 2019.  She makes very little amount of urine. Patient's HD schedule is Tuesday Thursday Saturday.  Her last dialysis session was on Saturday - 8/7.  She missed yesterday's session because she was feeling poorly.  She tells me she is usually compliant with her HD sessions.  8/12: Hyponatremia is much improved after normal saline administration.  She denies any further nausea or vomiting and has been tolerating her diet.  Urine culture currently pending and she will remain on Rocephin which will be given with hemodialysis today.  Assessment & Plan:   Principal Problem:   Hyponatremia Active Problems:   H/O chronic pancreatitis   Seizure disorder (HCC)   ESRD (end stage renal disease) (HCC)   Hyponatremia-improved -Secondary to volume depletion with poor p.o. intake and vomiting noted -Plan for discharge if stable in a.m. and above 130 after hemodialysis -Hold further IV normal saline  Generalized weakness with vomiting likely secondary to above versus UTI -Continue on Rocephin with urine culture pending -Likely much of this is colonization in the setting of chronic symptoms -We will consider oral medication at time of discharge  ESRD on HD TTS -Missed hemodialysis this past  Tuesday -Plans for hemodialysis in 2 L ultrafiltration today  Hypertension -Noted elevation with plans to continue Coreg and nifedipine -2 L ultrafiltration with hemodialysis today which should be helpful  History of seizures -Resume Topamax, Keppra, and phenytoin  History of type 2 diabetes -Continue sensitive SSI -Hemoglobin A1c 6.2%   DVT prophylaxis: Heparin Code Status: Full code Family Communication: Discussed with son [name] on phone 8/12 Disposition Plan:   Status is: Observation  The patient will require care spanning > 2 midnights and should be moved to inpatient because: IV treatments appropriate due to intensity of illness or inability to take PO and Inpatient level of care appropriate due to severity of illness  Dispo: The patient is from: Home              Anticipated d/c is to: Home              Anticipated d/c date is: 1 day              Patient currently is not medically stable to d/c.  Consultants:   Nephrology  Procedures:   See below  Antimicrobials:  Anti-infectives (From admission, onward)   Start     Dose/Rate Route Frequency Ordered Stop   09/28/19 1700  cefTRIAXone (ROCEPHIN) 1 g in sodium chloride 0.9 % 100 mL IVPB     Discontinue     1 g 200 mL/hr over 30 Minutes Intravenous Every 24 hours 09/27/19 2247     09/27/19 1700  cefTRIAXone (ROCEPHIN) 1 g in sodium chloride 0.9 % 100 mL IVPB     Discontinue  1 g 200 mL/hr over 30 Minutes Intravenous  Once 09/27/19 1646         Subjective: Patient seen and evaluated today with no new acute complaints or concerns. No acute concerns or events noted overnight.  She is tolerating her breakfast without any further nausea or vomiting.  Objective: Vitals:   09/27/19 2000 09/27/19 2218 09/27/19 2303 09/28/19 0500  BP: (!) 165/71 (!) 154/84 (!) 153/68 (!) 179/64  Pulse:  86 72 66  Resp: 13 15 14 12   Temp:  98.2 F (36.8 C) 98.3 F (36.8 C) 97.8 F (36.6 C)  TempSrc:  Oral Oral Oral    SpO2: 96% 95% 99% 100%  Height:   4\' 11"  (1.499 m)    No intake or output data in the 24 hours ending 09/28/19 1335 There were no vitals filed for this visit.  Examination:  General exam: Appears calm and comfortable  Respiratory system: Clear to auscultation. Respiratory effort normal. Cardiovascular system: S1 & S2 heard, RRR.  Gastrointestinal system: Abdomen is soft Central nervous system: Alert and awake Extremities: No edema Skin: No rashes, lesions or ulcers Psychiatry: Judgement and insight appear normal. Mood & affect appropriate.     Data Reviewed: I have personally reviewed following labs and imaging studies  CBC: Recent Labs  Lab 09/27/19 1406  WBC 9.1  NEUTROABS 6.8  HGB 11.0*  HCT 33.5*  MCV 103.7*  PLT 250   Basic Metabolic Panel: Recent Labs  Lab 09/27/19 1406 09/28/19 0525  NA 121* 129*  K 4.0 4.1  CL 86* 92*  CO2 21* 23  GLUCOSE 124* 136*  BUN 43* 48*  CREATININE 5.64* 6.21*  CALCIUM 8.0* 7.9*  PHOS  --  6.1*   GFR: CrCl cannot be calculated (Unknown ideal weight.). Liver Function Tests: Recent Labs  Lab 09/27/19 1406 09/28/19 0525  AST 27  --   ALT 30  --   ALKPHOS 115  --   BILITOT 0.9  --   PROT 7.5  --   ALBUMIN 3.9 3.0*   Recent Labs  Lab 09/27/19 1406  LIPASE 15   No results for input(s): AMMONIA in the last 168 hours. Coagulation Profile: No results for input(s): INR, PROTIME in the last 168 hours. Cardiac Enzymes: No results for input(s): CKTOTAL, CKMB, CKMBINDEX, TROPONINI in the last 168 hours. BNP (last 3 results) No results for input(s): PROBNP in the last 8760 hours. HbA1C: Recent Labs    09/28/19 0525  HGBA1C 6.2*   CBG: Recent Labs  Lab 09/27/19 1305  GLUCAP 133*   Lipid Profile: No results for input(s): CHOL, HDL, LDLCALC, TRIG, CHOLHDL, LDLDIRECT in the last 72 hours. Thyroid Function Tests: No results for input(s): TSH, T4TOTAL, FREET4, T3FREE, THYROIDAB in the last 72 hours. Anemia  Panel: No results for input(s): VITAMINB12, FOLATE, FERRITIN, TIBC, IRON, RETICCTPCT in the last 72 hours. Sepsis Labs: No results for input(s): PROCALCITON, LATICACIDVEN in the last 168 hours.  Recent Results (from the past 240 hour(s))  Urine culture     Status: None   Collection Time: 09/27/19  1:11 PM   Specimen: Urine, Clean Catch  Result Value Ref Range Status   Specimen Description   Final    URINE, CLEAN CATCH Performed at [facility], [address] [place], [place] [address]    Special Requests   Final    NONE Performed at [facility], [address] [place],  [address]    Culture   Final    NO  GROWTH Performed at [facility], [place] [address] [place], [place] [address]    Report Status 09/28/2019 FINAL  Final  SARS Coronavirus 2 by RT PCR (hospital order, performed in [facility]  hospital lab) Nasopharyngeal Nasopharyngeal Swab     Status: None   Collection Time: 09/27/19  3:26 PM   Specimen: Nasopharyngeal Swab  Result Value Ref Range Status   SARS Coronavirus 2 NEGATIVE NEGATIVE Final    Comment: (NOTE) SARS-CoV-2 target nucleic acids are NOT DETECTED.  The SARS-CoV-2 RNA is generally detectable in upper and lower respiratory specimens during the acute phase of infection. The lowest concentration of SARS-CoV-2 viral copies this assay can detect is 250 copies / mL. A negative result does not preclude SARS-CoV-2 infection and should not be used as the sole basis for treatment or other patient management decisions.  A negative result may occur with improper specimen collection / handling, submission of specimen other than nasopharyngeal swab, presence of viral mutation(s) within the areas targeted by this assay, and inadequate number of viral copies (<250 copies / mL). A negative result must be combined with clinical observations, patient history, and epidemiological information.  Fact Sheet for Patients:    StrictlyIdeas.no  Fact Sheet for Healthcare Providers: BankingDealers.co.za  This test is not yet approved or  cleared by the [place] FDA and has been authorized for detection and/or diagnosis of SARS-CoV-2 by FDA under an Emergency Use Authorization (EUA).  This EUA will remain in effect (meaning this test can be used) for the duration of the COVID-19 declaration under Section 564(b)(1) of the Act, 21 U.S.C. section 360bbb-3(b)(1), unless the authorization is terminated or revoked sooner.  Performed at [facility], [address] [place], [place] [address]          Radiology Studies: DG Chest 2 View  Result Date: 09/27/2019 CLINICAL DATA:  Shortness of breath, weakness. Additional provided: Dialysis patient with nausea and vomiting for 2 days, missed dialysis this week due to nausea and vomiting. EXAM: CHEST - 2 VIEW COMPARISON:  Prior chest radiographs 07/08/2019 and earlier. FINDINGS: Unchanged cardiomegaly. No appreciable airspace consolidation or pulmonary edema. No evidence of pleural effusion or pneumothorax. No acute bony abnormality identified. Sequela of prior ORIF of the proximal right humerus, incompletely imaged. Overlying cardiac monitoring leads. IMPRESSION: Unchanged cardiomegaly. No appreciable airspace consolidation or pulmonary edema. Electronically Signed   By: Kellie Simmering DO   On: 09/27/2019 14:56        Scheduled Meds: . carvedilol  6.25 mg Oral BID  . Chlorhexidine Gluconate Cloth  6 each Topical Q0600  . cinacalcet  30 mg Oral QPM  . folic acid  1 mg Oral q morning - 10a  . heparin  5,000 Units Subcutaneous Q8H  . insulin aspart  0-6 Units Subcutaneous TID WC  . levETIRAcetam  1,000 mg Oral q morning - 10a  . NIFEdipine  30 mg Oral q morning - 10a  . phenytoin  200 mg Oral BID  . rOPINIRole  1 mg Oral TID  . thiamine  100 mg Oral Daily  . topiramate  25 mg Oral BID   Continuous Infusions: .  cefTRIAXone (ROCEPHIN)  IV    . cefTRIAXone (ROCEPHIN)  IV       LOS: 0 days    Time spent: 35 minutes    Jazzlynn Rawe Darleen Crocker, DO [facility]  If 7PM-7AM, please contact night-coverage [email or web address] 09/28/2019, 1:35 PM

## 2019-09-29 LAB — BASIC METABOLIC PANEL
Anion gap: 13 (ref 5–15)
BUN: 23 mg/dL (ref 8–23)
CO2: 24 mmol/L (ref 22–32)
Calcium: 7.7 mg/dL — ABNORMAL LOW (ref 8.9–10.3)
Chloride: 92 mmol/L — ABNORMAL LOW (ref 98–111)
Creatinine, Ser: 3.85 mg/dL — ABNORMAL HIGH (ref 0.44–1.00)
GFR calc Af Amer: 14 mL/min — ABNORMAL LOW (ref >60)
GFR calc non Af Amer: 12 mL/min — ABNORMAL LOW (ref >60)
Glucose, Bld: 132 mg/dL — ABNORMAL HIGH (ref 70–99)
Potassium: 3.7 mmol/L (ref 3.5–5.1)
Sodium: 129 mmol/L — ABNORMAL LOW (ref 135–145)

## 2019-09-29 LAB — MAGNESIUM: Magnesium: 2.1 mg/dL (ref 1.7–2.4)

## 2019-09-29 LAB — HEPATITIS B SURFACE ANTIGEN: Hepatitis B Surface Ag: NONREACTIVE

## 2019-09-29 LAB — CBC
HCT: 30.8 % — ABNORMAL LOW (ref 36.0–46.0)
Hemoglobin: 10.1 g/dL — ABNORMAL LOW (ref 12.0–15.0)
MCH: 33.7 pg (ref 26.0–34.0)
MCHC: 32.8 g/dL (ref 30.0–36.0)
MCV: 102.7 fL — ABNORMAL HIGH (ref 80.0–100.0)
Platelets: 125 10*3/uL — ABNORMAL LOW (ref 150–400)
RBC: 3 MIL/uL — ABNORMAL LOW (ref 3.87–5.11)
RDW: 12 % (ref 11.5–15.5)
WBC: 5.5 10*3/uL (ref 4.0–10.5)
nRBC: 0 % (ref 0.0–0.2)

## 2019-09-29 LAB — GLUCOSE, CAPILLARY
Glucose-Capillary: 118 mg/dL — ABNORMAL HIGH (ref 70–99)
Glucose-Capillary: 162 mg/dL — ABNORMAL HIGH (ref 70–99)

## 2019-09-29 MED ORDER — ONDANSETRON HCL 4 MG PO TABS: 4.0000 mg | ORAL_TABLET | Freq: Four times a day (QID) | ORAL | 0 refills | Status: DC | PRN

## 2019-09-29 MED ORDER — FOSFOMYCIN TROMETHAMINE 3 G PO PACK
3.0000 g | PACK | Freq: Once | ORAL | Status: AC
  Administered 2019-09-29: 3 g via ORAL
  Filled 2019-09-29: qty 3

## 2019-09-29 MED ORDER — PHENYTOIN 50 MG PO CHEW: 300.0000 mg | CHEWABLE_TABLET | Freq: Every day | ORAL | 0 refills | Status: DC

## 2019-09-29 NOTE — Discharge Summary (Addendum)
Physician Discharge Summary  Erica [name] JQG:[id number] DOB: 1955/12/29 DOA: 09/27/2019  PCP: Scotty Court, DO  Admit date: 09/27/2019  Discharge date: 09/29/2019  Admitted From:Home  Disposition:  Home  Recommendations for Outpatient Follow-up:  1. Follow up with PCP in 1-2 weeks 2. Continue hemodialysis as routinely scheduled TTS, next dialysis session 8/14 3. Home bicarbonate discontinued per nephrology recommendation, continue other medications as prescribed 4. Zofran prescribed as needed for any ongoing nausea or vomiting issues.  Patient is tolerating diet at this point with no further symptoms of vomiting reported.  Continues to have very mild nausea.  Home Health: None  Equipment/Devices: None  Discharge Condition: Stable  CODE STATUS: Full  Diet recommendation: Renal diet/carb modified  Brief/Interim Summary:Per HPI: Erica B Reynoldsis a 64 y.o.femalewith medical history significant forESRD, diabetes mellitus, seizures, diastolic CHF, hypertension and stroke. Patient presented to the ED today with complaints of onset of nausea vomiting generalized weakness that started about 5 AM this morning. Patient reports about 4-5 episodes of vomiting today, nonbloody. Also poor p.o. intake over the past few days. She reports diffuse mild abdominal pain. No loose stools. No diarrhea. Patient has completed her Covid vaccine. She reports chronic dysuria since she started dialysis in 2019. She makes very little amount of urine. Patient's HD schedule is Tuesday Thursday Saturday. Her last dialysis session was on Saturday - 8/7.She missed yesterday's session because she was feeling poorly. She tells me she is usually compliant with her HD sessions.  -Patient was admitted with generalized weakness along with some nausea and vomiting in the setting of UTI as well as some hyponatremia.  8/12: Hyponatremia is much improved after normal saline administration.  She  denies any further nausea or vomiting and has been tolerating her diet.  Urine culture currently pending and she will remain on Rocephin which will be given with hemodialysis today.  8/13: Hyponatremia remained stable at 129 and patient symptoms have improved considerably.  Urine culture with no growth noted.  She will receive 1 dose of fosfomycin today prior to discharge.  She denies any significant urinary symptoms and is tolerating diet.  She has received hemodialysis on 8/12 and has been seen by nephrology today with no other concerns noted.  Plans are to continue dialysis as scheduled on 8/14.  She is stable for discharge with no other acute events noted throughout the course of this admission.  Discharge Diagnoses:  Principal Problem:   Hyponatremia Active Problems:   H/O chronic pancreatitis   Seizure disorder (HCC)   ESRD (end stage renal disease) ([place])  Principal discharge diagnosis: Generalized weakness with nausea and vomiting in the setting of UTI and hyponatremia.  Discharge Instructions  Discharge Instructions    Diet - low sodium heart healthy   Complete by: As directed    Increase activity slowly   Complete by: As directed      Allergies as of 09/29/2019      Reactions   Aspirin Palpitations   Listed on [facility] 11/12/18      Medication List    STOP taking these medications   sodium bicarbonate 650 MG tablet     TAKE these medications   acetaminophen 325 MG tablet Commonly known as: TYLENOL Take 650 mg by mouth every 4 (four) hours as needed for moderate pain.   Artificial Tears 1 % ophthalmic solution Generic drug: carboxymethylcellulose Place 1 drop into both eyes daily as needed (dry eyes).   calcitRIOL 0.25 MCG capsule Commonly known as: ROCALTROL  Take 1 capsule (0.25 mcg total) by mouth Every Tuesday,Thursday,and Saturday with dialysis.   carvedilol 6.25 MG tablet Commonly known as: COREG Take 1 tablet (6.25 mg total) by mouth 2 (two) times daily.    cetaphil cream Apply 1 application topically every 12 (twelve) hours as needed (dry skin).   cinacalcet 30 MG tablet Commonly known as: SENSIPAR Take 30 mg by mouth every evening.   dextrose 40 % Gel Commonly known as: GLUTOSE Take 1 Tube by mouth every 10 (ten) minutes as needed for low blood sugar (unitl blood sugar is above 70).   ferric citrate 1 GM 210 MG(Fe) tablet Commonly known as: AURYXIA Take 2 tablets (420 mg total) by mouth 3 (three) times daily with meals.   folic acid 1 MG tablet Commonly known as: FOLVITE Take 1 mg by mouth every morning.   insulin lispro 100 UNIT/ML injection Commonly known as: HUMALOG Inject 2-10 Units into the skin 4 (four) times daily. Per sliding scale: CBG 151-200 2 units, 201-250 4 units, 251-300 6 units, 301-350 8 units, 351-400 10 units,   levETIRAcetam 1000 MG tablet Commonly known as: KEPPRA Take 1,000 mg by mouth every morning.   loperamide 2 MG capsule Commonly known as: IMODIUM Take 4 mg by mouth daily as needed for diarrhea or loose stools.   NIFEdipine 30 MG 24 hr tablet Commonly known as: PROCARDIA-XL/NIFEDICAL-XL Take 30 mg by mouth every morning.   ondansetron 4 MG tablet Commonly known as: ZOFRAN Take 1 tablet (4 mg total) by mouth every 6 (six) hours as needed for nausea. What changed:   when to take this  reasons to take this   Oxycodone HCl 10 MG Tabs Take 1 mg by mouth daily as needed (pain).   phenytoin 50 MG tablet Commonly known as: Dilantin Infatabs Chew 6 tablets (300 mg total) by mouth daily. What changed:   how much to take  when to take this   polyethylene glycol 17 g packet Commonly known as: MiraLax Take 17 g by mouth daily.   ProAir HFA 108 (90 Base) MCG/ACT inhaler Generic drug: albuterol Inhale 2 puffs into the lungs every 4 (four) hours as needed for wheezing or shortness of breath.   rOPINIRole 1 MG tablet Commonly known as: REQUIP Take 1 mg by mouth 3 (three) times daily.    senna 8.6 MG Tabs tablet Commonly known as: SENOKOT Take 8.6 mg by mouth in the morning and at bedtime.   topiramate 25 MG tablet Commonly known as: TOPAMAX Take 1 tablet (25 mg total) by mouth 2 (two) times daily.   Vitamin D (Ergocalciferol) 1.25 MG (50000 UNIT) Caps capsule Commonly known as: DRISDOL Take 50,000 Units by mouth every Friday.       Follow-up Information    Octavio Graves P, DO Follow up in 1 week(s).   Contact information: [address] [place] [address] [phone]              Allergies  Allergen Reactions  . Aspirin Palpitations    Listed on [facility] 11/12/18    Consultations:  Nephrology   Procedures/Studies: DG Chest 2 View  Result Date: 09/27/2019 CLINICAL DATA:  Shortness of breath, weakness. Additional provided: Dialysis patient with nausea and vomiting for 2 days, missed dialysis this week due to nausea and vomiting. EXAM: CHEST - 2 VIEW COMPARISON:  Prior chest radiographs 07/08/2019 and earlier. FINDINGS: Unchanged cardiomegaly. No appreciable airspace consolidation or pulmonary edema. No evidence of pleural effusion or pneumothorax. No  acute bony abnormality identified. Sequela of prior ORIF of the proximal right humerus, incompletely imaged. Overlying cardiac monitoring leads. IMPRESSION: Unchanged cardiomegaly. No appreciable airspace consolidation or pulmonary edema. Electronically Signed   By: Kellie Simmering DO   On: 09/27/2019 14:56    Discharge Exam: Vitals:   09/29/19 0418 09/29/19 0904  BP: (!) 168/61 (!) 181/72  Pulse: 73 72  Resp: 16 16  Temp: 98.3 F (36.8 C)   SpO2: 97% 100%   Vitals:   09/29/19 0240 09/29/19 0418 09/29/19 0439 09/29/19 0904  BP: (!) 167/71 (!) 168/61  (!) 181/72  Pulse: 74 73  72  Resp: 18 16  16   Temp: 98.4 F (36.9 C) 98.3 F (36.8 C)    TempSrc: Oral Oral    SpO2: 98% 97%  100%  Weight:   46.8 kg   Height:        General: Pt is alert, awake, not in acute distress Cardiovascular:  RRR, S1/S2 +, no rubs, no gallops Respiratory: CTA bilaterally, no wheezing, no rhonchi Abdominal: Soft, NT, ND, bowel sounds + Extremities: no edema, no cyanosis    The results of significant diagnostics from this hospitalization (including imaging, microbiology, ancillary and laboratory) are listed below for reference.     Microbiology: Recent Results (from the past 240 hour(s))  Urine culture     Status: None   Collection Time: 09/27/19  1:11 PM   Specimen: Urine, Clean Catch  Result Value Ref Range Status   Specimen Description   Final    URINE, CLEAN CATCH Performed at [facility], [address], [place], [place] [address]    Special Requests   Final    NONE Performed at [facility], [address] [place], [place] [address]    Culture   Final    NO GROWTH Performed at [facility], [place] [address], [place], [place] [address]    Report Status 09/28/2019 FINAL  Final  SARS Coronavirus 2 by RT PCR (hospital order, performed in [facility] hospital lab) Nasopharyngeal Nasopharyngeal Swab     Status: None   Collection Time: 09/27/19  3:26 PM   Specimen: Nasopharyngeal Swab  Result Value Ref Range Status   SARS Coronavirus 2 NEGATIVE NEGATIVE Final    Comment: (NOTE) SARS-CoV-2 target nucleic acids are NOT DETECTED.  The SARS-CoV-2 RNA is generally detectable in upper and lower respiratory specimens during the acute phase of infection. The lowest concentration of SARS-CoV-2 viral copies this assay can detect is 250 copies / mL. A negative result does not preclude SARS-CoV-2 infection and should not be used as the sole basis for treatment or other patient management decisions.  A negative result may occur with improper specimen collection / handling, submission of specimen other than nasopharyngeal swab, presence of viral mutation(s) within the areas targeted by this assay, and inadequate number of viral copies (<250 copies / mL). A negative result must be combined  with clinical observations, patient history, and epidemiological information.  Fact Sheet for Patients:   StrictlyIdeas.no  Fact Sheet for Healthcare Providers: BankingDealers.co.za  This test is not yet approved or  cleared by the [place] FDA and has been authorized for detection and/or diagnosis of SARS-CoV-2 by FDA under an Emergency Use Authorization (EUA).  This EUA will remain in effect (meaning this test can be used) for the duration of the COVID-19 declaration under Section 564(b)(1) of the Act, 21 U.S.C. section 360bbb-3(b)(1), unless the authorization is terminated or revoked sooner.  Performed  at [facility], [address] [place], [place] [address]      Labs: BNP (last 3 results) No results for input(s): BNP in the last 8760 hours. Basic Metabolic Panel: Recent Labs  Lab 09/27/19 1406 09/28/19 0525 09/29/19 0544  NA 121* 129* 129*  K 4.0 4.1 3.7  CL 86* 92* 92*  CO2 21* 23 24  GLUCOSE 124* 136* 132*  BUN 43* 48* 23  CREATININE 5.64* 6.21* 3.85*  CALCIUM 8.0* 7.9* 7.7*  MG  --   --  2.1  PHOS  --  6.1*  --    Liver Function Tests: Recent Labs  Lab 09/27/19 1406 09/28/19 0525  AST 27  --   ALT 30  --   ALKPHOS 115  --   BILITOT 0.9  --   PROT 7.5  --   ALBUMIN 3.9 3.0*   Recent Labs  Lab 09/27/19 1406  LIPASE 15   No results for input(s): AMMONIA in the last 168 hours. CBC: Recent Labs  Lab 09/27/19 1406 09/29/19 0544  WBC 9.1 5.5  NEUTROABS 6.8  --   HGB 11.0* 10.1*  HCT 33.5* 30.8*  MCV 103.7* 102.7*  PLT 174 125*   Cardiac Enzymes: No results for input(s): CKTOTAL, CKMB, CKMBINDEX, TROPONINI in the last 168 hours. BNP: Invalid input(s): POCBNP CBG: Recent Labs  Lab 09/27/19 1305 09/28/19 1631 09/28/19 2045 09/29/19 0746 09/29/19 1152  GLUCAP 133* 315* 179* 118* 162*   D-Dimer No results for input(s): DDIMER in the last 72 hours. Hgb A1c Recent Labs     09/28/19 0525  HGBA1C 6.2*   Lipid Profile No results for input(s): CHOL, HDL, LDLCALC, TRIG, CHOLHDL, LDLDIRECT in the last 72 hours. Thyroid function studies No results for input(s): TSH, T4TOTAL, T3FREE, THYROIDAB in the last 72 hours.  Invalid input(s): FREET3 Anemia work up No results for input(s): VITAMINB12, FOLATE, FERRITIN, TIBC, IRON, RETICCTPCT in the last 72 hours. Urinalysis    Component Value Date/Time   COLORURINE YELLOW 09/27/2019 1310   APPEARANCEUR TURBID (A) 09/27/2019 1310   LABSPEC 1.013 09/27/2019 1310   PHURINE 7.0 09/27/2019 1310   GLUCOSEU 50 (A) 09/27/2019 1310   HGBUR SMALL (A) 09/27/2019 1310   BILIRUBINUR NEGATIVE 09/27/2019 1310   KETONESUR NEGATIVE 09/27/2019 1310   PROTEINUR >=300 (A) 09/27/2019 1310   UROBILINOGEN 0.2 07/25/2014 2309   NITRITE NEGATIVE 09/27/2019 1310   LEUKOCYTESUR MODERATE (A) 09/27/2019 1310   Sepsis Labs Invalid input(s): PROCALCITONIN,  WBC,  LACTICIDVEN Microbiology Recent Results (from the past 240 hour(s))  Urine culture     Status: None   Collection Time: 09/27/19  1:11 PM   Specimen: Urine, Clean Catch  Result Value Ref Range Status   Specimen Description   Final    URINE, CLEAN CATCH Performed at [facility], [address], [place], [place] [address]    Special Requests   Final    NONE Performed at [facility], [address] [place], [place] [address]    Culture   Final    NO GROWTH Performed at [facility], [place] [address], [place], [place] [address]    Report Status 09/28/2019 FINAL  Final  SARS Coronavirus 2 by RT PCR (hospital order, performed in [facility] hospital lab) Nasopharyngeal Nasopharyngeal Swab     Status: None   Collection Time: 09/27/19  3:26 PM   Specimen: Nasopharyngeal Swab  Result Value Ref Range Status   SARS Coronavirus 2 NEGATIVE NEGATIVE Final    Comment: (  NOTE) SARS-CoV-2 target nucleic acids are NOT DETECTED.  The SARS-CoV-2 RNA is generally detectable in upper  and lower respiratory specimens during the acute phase of infection. The lowest concentration of SARS-CoV-2 viral copies this assay can detect is 250 copies / mL. A negative result does not preclude SARS-CoV-2 infection and should not be used as the sole basis for treatment or other patient management decisions.  A negative result may occur with improper specimen collection / handling, submission of specimen other than nasopharyngeal swab, presence of viral mutation(s) within the areas targeted by this assay, and inadequate number of viral copies (<250 copies / mL). A negative result must be combined with clinical observations, patient history, and epidemiological information.  Fact Sheet for Patients:   StrictlyIdeas.no  Fact Sheet for Healthcare Providers: BankingDealers.co.za  This test is not yet approved or  cleared by the [place] FDA and has been authorized for detection and/or diagnosis of SARS-CoV-2 by FDA under an Emergency Use Authorization (EUA).  This EUA will remain in effect (meaning this test can be used) for the duration of the COVID-19 declaration under Section 564(b)(1) of the Act, 21 U.S.C. section 360bbb-3(b)(1), unless the authorization is terminated or revoked sooner.  Performed at [facility], [address], [place], [place] [address]      Time coordinating discharge: 35 minutes  SIGNED:   Rodena Goldmann, DO [facility] 09/29/2019, 12:55 PM  If 7PM-7AM, please contact night-coverage [email or web address]

## 2019-09-29 NOTE — Progress Notes (Signed)
Nephrology Follow-Up Consult note   Assessment/Recommendations: Erica [name] is a 64 y.o. female with a history of ESRD on dialysis at [facility] (TTS), BM, CHF, HTN, seizure disorder, and remote alcohol induced pancreatitis, who presented to the emergency department because of nausea, vomiting, weakness found to have hyponatremia  1. Hyponatremia: Likely associated with poor p.o. intake resolved with hydration.  Na stable at 129. Will continue to improve with outpt dialysis tomorrow. Okay for discharge. 2.  ESRD:  TTS [place], last HD was 10/24/2019.  Missed dialysis on Tuesday because of her symptoms of nausea.  S/p dialysis w/ 2L UF tolerated well. 3.  Hypertension/volume: Hypertensive but improved with volume removal. Continue volume removal at outpt dialysis 4.  Anemia: Hemoglobin 10. No indication for ESA at this time.  5. Metabolic bone disease: Hypocalcemia but corrects to near normal when accounting for albumin.  Resume binder on DC.  Continue home Sensipar and calcitriol.  No need to continue home bicarb at this time.   Recommendations conveyed to primary service.    [facility] 09/29/2019 8:50 AM  ___________________________________________________________  CC: ESRD  Interval History/Subjective: Patient feels well today. Slightly tired. No nausea or vomiting. Denies chest pain. Na stable at 129. Tolerated dialysis.    Medications:  Current Facility-Administered Medications  Medication Dose Route Frequency Provider Last Rate Last Admin  . 0.9 %  sodium chloride infusion  100 mL Intravenous PRN Reesa Chew, MD      . 0.9 %  sodium chloride infusion  100 mL Intravenous PRN Reesa Chew, MD      . acetaminophen (TYLENOL) tablet 650 mg  650 mg Oral Q6H PRN Emokpae, Ejiroghene E, MD       Or  . acetaminophen (TYLENOL) suppository 650 mg  650 mg Rectal Q6H PRN Emokpae, Ejiroghene E, MD      . carvedilol (COREG) tablet 6.25 mg  6.25 mg  Oral BID Emokpae, Ejiroghene E, MD   6.25 mg at 09/28/19 2352  . cefTRIAXone (ROCEPHIN) 1 g in sodium chloride 0.9 % 100 mL IVPB  1 g Intravenous Once Emokpae, Ejiroghene E, MD      . cefTRIAXone (ROCEPHIN) 1 g in sodium chloride 0.9 % 100 mL IVPB  1 g Intravenous Q24H Emokpae, Ejiroghene E, MD 200 mL/hr at 09/28/19 2030 1 g at 09/28/19 2030  . Chlorhexidine Gluconate Cloth 2 % PADS 6 each  6 each Topical Q0600 Reesa Chew, MD   6 each at 09/29/19 0440  . cinacalcet (SENSIPAR) tablet 30 mg  30 mg Oral QPM Emokpae, Ejiroghene E, MD   30 mg at 09/28/19 1711  . folic acid (FOLVITE) tablet 1 mg  1 mg Oral q morning - 10a Emokpae, Ejiroghene E, MD   1 mg at 09/28/19 0858  . heparin injection 5,000 Units  5,000 Units Subcutaneous Q8H Emokpae, Ejiroghene E, MD   5,000 Units at 09/29/19 0535  . insulin aspart (novoLOG) injection 0-6 Units  0-6 Units Subcutaneous TID WC Shah, Pratik D, DO   4 Units at 09/28/19 1711  . levETIRAcetam (KEPPRA) tablet 1,000 mg  1,000 mg Oral q morning - 10a Emokpae, Ejiroghene E, MD   1,000 mg at 09/28/19 0858  . lidocaine (PF) (XYLOCAINE) 1 % injection 5 mL  5 mL Intradermal PRN Reesa Chew, MD      . lidocaine-prilocaine (EMLA) cream 1 application  1 application Topical PRN Reesa Chew, MD      .  NIFEdipine (PROCARDIA-XL/NIFEDICAL-XL) 24 hr tablet 30 mg  30 mg Oral q morning - 10a Emokpae, Ejiroghene E, MD   30 mg at 09/28/19 0858  . ondansetron (ZOFRAN) tablet 4 mg  4 mg Oral Q6H PRN Emokpae, Ejiroghene E, MD   4 mg at 09/28/19 1222   Or  . ondansetron (ZOFRAN) injection 4 mg  4 mg Intravenous Q6H PRN Emokpae, Ejiroghene E, MD      . pentafluoroprop-tetrafluoroeth (GEBAUERS) aerosol 1 application  1 application Topical PRN Reesa Chew, MD      . phenytoin (DILANTIN) chewable tablet 200 mg  200 mg Oral BID Emokpae, Ejiroghene E, MD   200 mg at 09/28/19 2352  . polyethylene glycol (MIRALAX / GLYCOLAX) packet 17 g  17 g Oral Daily PRN Emokpae, Ejiroghene  E, MD      . rOPINIRole (REQUIP) tablet 1 mg  1 mg Oral TID Emokpae, Ejiroghene E, MD   1 mg at 09/28/19 2352  . thiamine tablet 100 mg  100 mg Oral Daily Emokpae, Ejiroghene E, MD   100 mg at 09/28/19 0858  . topiramate (TOPAMAX) tablet 25 mg  25 mg Oral BID Emokpae, Ejiroghene E, MD   25 mg at 09/28/19 2352      Review of Systems: 10 systems reviewed and negative except per interval history/subjective  Physical Exam: Vitals:   09/29/19 0240 09/29/19 0418  BP: (!) 167/71 (!) 168/61  Pulse: 74 73  Resp: 18 16  Temp: 98.4 F (36.9 C) 98.3 F (36.8 C)  SpO2: 98% 97%   No intake/output data recorded.  Intake/Output Summary (Last 24 hours) at 09/29/2019 0850 Last data filed at 09/29/2019 0300 Gross per 24 hour  Intake 220 ml  Output 2045 ml  Net -1825 ml   Constitutional: well-appearing, no acute distress ENMT: ears and nose without scars or lesions, MMM CV: normal rate, no edema Respiratory: bilateral chest rise, normal work of breathing Gastrointestinal: soft, non-tender, no palpable masses or hernias Skin: no visible lesions or rashes Psych: alert, judgement/insight appropriate, appropriate mood and affect   Test Results I personally reviewed new and old clinical labs and radiology tests Lab Results  Component Value Date   NA 129 (L) 09/29/2019   K 3.7 09/29/2019   CL 92 (L) 09/29/2019   CO2 24 09/29/2019   BUN 23 09/29/2019   CREATININE 3.85 (H) 09/29/2019   CALCIUM 7.7 (L) 09/29/2019   ALBUMIN 3.0 (L) 09/28/2019   PHOS 6.1 (H) 09/28/2019

## 2019-09-29 NOTE — Progress Notes (Signed)
Notified by telemetry "pt had period of Second degree heart block, type II" Strip reviewed, vitals obtained. MD notified. Patient resting in bed at this time. Patient Sinus Rhythm on the monitor, heart rate 74.

## 2019-09-29 NOTE — Progress Notes (Signed)
Notified by telemetry pt had 15 beat run of Vtach. Vitals obtained. MD notified.

## 2019-10-24 ENCOUNTER — Encounter (HOSPITAL_COMMUNITY): Payer: Self-pay | Admitting: *Deleted

## 2019-10-24 ENCOUNTER — Emergency Department (HOSPITAL_COMMUNITY)
Admission: EM | Admit: 2019-10-24 | Discharge: 2019-10-25 | Disposition: A | Discharge status: Home / Self Care | Payer: Medicare Other | Attending: Emergency Medicine | Admitting: Emergency Medicine

## 2019-10-24 ENCOUNTER — Other Ambulatory Visit: Payer: Self-pay

## 2019-10-24 ENCOUNTER — Emergency Department (HOSPITAL_COMMUNITY): Payer: Medicare Other

## 2019-10-24 DIAGNOSIS — Z20822 Contact with and (suspected) exposure to covid-19: Secondary | ICD-10-CM | POA: Insufficient documentation

## 2019-10-24 DIAGNOSIS — E1122 Type 2 diabetes mellitus with diabetic chronic kidney disease: Secondary | ICD-10-CM | POA: Insufficient documentation

## 2019-10-24 DIAGNOSIS — I120 Hypertensive chronic kidney disease with stage 5 chronic kidney disease or end stage renal disease: Secondary | ICD-10-CM | POA: Insufficient documentation

## 2019-10-24 DIAGNOSIS — I5030 Unspecified diastolic (congestive) heart failure: Secondary | ICD-10-CM | POA: Diagnosis not present

## 2019-10-24 DIAGNOSIS — M549 Dorsalgia, unspecified: Secondary | ICD-10-CM | POA: Insufficient documentation

## 2019-10-24 DIAGNOSIS — R110 Nausea: Secondary | ICD-10-CM | POA: Diagnosis present

## 2019-10-24 DIAGNOSIS — R0602 Shortness of breath: Secondary | ICD-10-CM | POA: Diagnosis not present

## 2019-10-24 DIAGNOSIS — N186 End stage renal disease: Secondary | ICD-10-CM | POA: Diagnosis not present

## 2019-10-24 LAB — COMPREHENSIVE METABOLIC PANEL
ALT: 25 U/L (ref 0–44)
AST: 21 U/L (ref 15–41)
Albumin: 4 g/dL (ref 3.5–5.0)
Alkaline Phosphatase: 174 U/L — ABNORMAL HIGH (ref 38–126)
Anion gap: 18 — ABNORMAL HIGH (ref 5–15)
BUN: 64 mg/dL — ABNORMAL HIGH (ref 8–23)
CO2: 26 mmol/L (ref 22–32)
Calcium: 8.8 mg/dL — ABNORMAL LOW (ref 8.9–10.3)
Chloride: 90 mmol/L — ABNORMAL LOW (ref 98–111)
Creatinine, Ser: 5.93 mg/dL — ABNORMAL HIGH (ref 0.44–1.00)
GFR calc Af Amer: 8 mL/min — ABNORMAL LOW (ref >60)
GFR calc non Af Amer: 7 mL/min — ABNORMAL LOW (ref >60)
Glucose, Bld: 328 mg/dL — ABNORMAL HIGH (ref 70–99)
Potassium: 4 mmol/L (ref 3.5–5.1)
Sodium: 134 mmol/L — ABNORMAL LOW (ref 135–145)
Total Bilirubin: 1.1 mg/dL (ref 0.3–1.2)
Total Protein: 7.7 g/dL (ref 6.5–8.1)

## 2019-10-24 LAB — CBC
HCT: 38 % (ref 36.0–46.0)
Hemoglobin: 12.3 g/dL (ref 12.0–15.0)
MCH: 33.7 pg (ref 26.0–34.0)
MCHC: 32.4 g/dL (ref 30.0–36.0)
MCV: 104.1 fL — ABNORMAL HIGH (ref 80.0–100.0)
Platelets: 124 10*3/uL — ABNORMAL LOW (ref 150–400)
RBC: 3.65 MIL/uL — ABNORMAL LOW (ref 3.87–5.11)
RDW: 12.1 % (ref 11.5–15.5)
WBC: 7.4 10*3/uL (ref 4.0–10.5)
nRBC: 0 % (ref 0.0–0.2)

## 2019-10-24 LAB — BETA-HYDROXYBUTYRIC ACID: Beta-Hydroxybutyric Acid: 0.15 mmol/L (ref 0.05–0.27)

## 2019-10-24 MED ORDER — ONDANSETRON 4 MG PO TBDP
4.0000 mg | ORAL_TABLET | Freq: Once | ORAL | Status: AC
  Administered 2019-10-24: 4 mg via ORAL
  Filled 2019-10-24: qty 1

## 2019-10-24 NOTE — ED Triage Notes (Signed)
C/o nausea onset 0600 today, also states she missed her dialysis appointment today due to schedule change

## 2019-10-24 NOTE — ED Provider Notes (Signed)
Emergency Department Provider Note   I have reviewed the triage vital signs and the nursing notes.   HISTORY  Chief Complaint Nausea   HPI Erica [name] is a 64 y.o. female with PMH reviewed below including ESRD presents to the ED with nausea and back pain starting yesterday evening. Patient reports missing HD this AM due to a transportation schedule change. She goes to [facility] in [place]. She has had nausea throughout the day but denies abdominal pain or diarrhea. She occasionally makes urine but not consistently. Denies any fever, chills, CP. She notes some more chronic SOB symptoms that are intermittent and not worse today. Back pain is in the lower back and non-radiating. Symptoms are worse with movement. Patient has been compliant with home medications including insulin. She reports getting Zofran in triage and is feeling somewhat better.   Past Medical History:  Diagnosis Date  . Alcohol-induced pancreatitis   . Chronic diarrhea   . Depression   . Diabetes mellitus    fasting blood sugar 110-120s  . Diastolic CHF ([place])   . DKA (diabetic ketoacidoses) ([place])   . Gastroparesis   . GERD (gastroesophageal reflux disease)   . Heart murmur   . History of kidney stones   . Hyperlipidemia   . Hypertension   . Hypokalemia   . Muscle spasm   . Neuropathic pain   . Neuropathy    Hx: of  . Pyelonephritis   . Seizures ([place])   . Vitamin B12 deficiency   . Vitamin D deficiency     Patient Active Problem List   Diagnosis Date Noted  . Hyponatremia 09/27/2019  . Closed fracture of right proximal humerus 04/20/2019  . Surgery, elective   . Seizure ([place]) 11/12/2018  . History of hydronephrosis --stents in place 12/08/2017  . Stroke ([place]) 11/29/2017  . Seizures ([place])   . Hydronephrosis   . Recurrent UTI   . Diabetes mellitus type 2 in nonobese (H[facility])   . ESRD (end stage renal disease) ([place])   . Anemia of chronic disease   . Chronic diastolic congestive heart failure ([place])    . Essential hypertension   . PICC (peripherally inserted central catheter) in place   . Acute pyelonephritis 11/12/2017  . Uncontrolled type 2 diabetes mellitus with hyperglycemia, with Karry Barrilleaux-term current use of insulin ([place]) 11/10/2017  . Renal failure 11/08/2017  . Seizure disorder ([place]) 11/08/2017  . Orthostatic hypotension 07/25/2014  . Moderate protein malnutrition ([place]) 09/15/2013  . Aspiration pneumonia ([place]) 09/15/2013  . Acute respiratory failure requiring reintubation ([place]) 09/11/2013  . probable Seizures due to metabolic disorder 47/42/5956  . Lactic acidosis 03/19/2013  . Abdominal pain 03/19/2013  . Rotavirus infection 10/29/2012  . Type II or unspecified type diabetes mellitus without mention of complication, uncontrolled 10/29/2012  . Protein-calorie malnutrition, severe ([place]) 10/27/2012  . NSTEMI (non-ST elevated myocardial infarction) ([place]) 10/26/2012  . Fever, unspecified 10/26/2012  . Hypotension 10/25/2012  . Metabolic acidosis 38/75/6433  . Chronic diarrhea 10/25/2012  . Tobacco abuse 10/25/2012  . DKA (diabetic ketoacidoses) ([place]) 09/09/2012  . Dehydration 09/09/2012  . DKA, type 2 ([place]) 05/20/2012  . Abnormal LFTs 05/20/2012  . Heart murmur, systolic 29/51/8841  . Hypoglycemia 07/08/2011  . Metabolic encephalopathy 66/07/3014  . Alcohol abuse 07/08/2011  . Hypokalemia 07/08/2011  . Nausea & vomiting 07/08/2011  . H/O chronic pancreatitis 07/08/2011    Past Surgical History:  Procedure Laterality Date  . AV FISTULA PLACEMENT Left 12/15/2017   Procedure: INSERTION OF ARTERIOVENOUS (  AV) GORE-TEX GRAFT ARM;  Surgeon: Rosetta Posner, MD;  Location: [facility];  Service: Vascular;  Laterality: Left;  . CATARACT EXTRACTION W/PHACO Right 03/14/2015   Procedure: CATARACT EXTRACTION PHACO AND INTRAOCULAR LENS PLACEMENT (IOC);  Surgeon: Baruch Goldmann, MD;  Location: [facility];  Service: Ophthalmology;  Laterality: Right;  CDE:11.13  . CATARACT EXTRACTION W/PHACO Left  04/11/2015   Procedure: CATARACT EXTRACTION PHACO AND INTRAOCULAR LENS PLACEMENT LEFT EYE CDE=9.68;  Surgeon: Baruch Goldmann, MD;  Location: [facility];  Service: Ophthalmology;  Laterality: Left;  . COLONOSCOPY  02/24/2010  . cystoscopy with ureteral stent  Bilateral 10/07/2017   At [facility] CV LINE RIGHT  11/17/2017  . IR FLUORO GUIDE CV LINE RIGHT  11/22/2017  . IR REMOVAL TUN CV CATH W/O FL  01/26/2018  . IR US GUIDE VASC ACCESS RIGHT  11/17/2017  . MULTIPLE EXTRACTIONS WITH ALVEOLOPLASTY N/A 06/13/2012   Procedure: MULTIPLE EXTRACION WITH ALVEOLOPLASTY EXTRACT: 18, 19, 20, 21, 22, 24, 25, 27, 28, 29, 30, 31;  Surgeon: Gae Bon, DDS;  Location: [facility];  Service: Oral Surgery;  Laterality: N/A;  . ORIF HUMERUS FRACTURE Right 04/20/2019   Procedure: OPEN REDUCTION INTERNAL FIXATION (ORIF) PROXIMAL HUMERUS FRACTURE;  Surgeon: Marchia Bond, MD;  Location: [facility];  Service: Orthopedics;  Laterality: Right;  . TUBAL LIGATION    . URETERAL STENT PLACEMENT  09/2017    Allergies Aspirin  Family History  Problem Relation Age of Onset  . Diabetes Sister   . Chronic Renal Failure Neg Hx     Social History Social History   Tobacco Use  . Smoking status: Never Smoker  . Smokeless tobacco: Current User    Types: Snuff  Vaping Use  . Vaping Use: Never used  Substance Use Topics  . Alcohol use: Not Currently    Alcohol/week: 1.0 standard drink    Types: 1 Cans of beer per week  . Drug use: No    Review of Systems  Constitutional: No fever/chills Eyes: No visual changes. ENT: No sore throat. Cardiovascular: Denies chest pain. Respiratory: Denies shortness of breath. Gastrointestinal: No abdominal pain.  Positive nausea, no vomiting.  No diarrhea.  No constipation. Genitourinary: Negative for dysuria. Musculoskeletal: Positive for back pain. Skin: Negative for rash. Neurological: Negative for headaches, focal weakness or numbness.  10-point ROS otherwise  negative.  ____________________________________________   PHYSICAL EXAM:  VITAL SIGNS: ED Triage Vitals [10/24/19 1558]  Enc Vitals Group     BP (!) 171/66     Pulse Rate 72     Resp 16     Temp 98.4 F (36.9 C)     Temp Source Oral     SpO2 94 %     Weight 98 lb (44.5 kg)     Height 5' (1.524 m)   Constitutional: Alert and oriented. Well appearing and in no acute distress. Eyes: Conjunctivae are normal.  Head: Atraumatic. Nose: No congestion/rhinnorhea. Mouth/Throat: Mucous membranes are moist.  Oropharynx non-erythematous. Neck: No stridor.   Cardiovascular: Normal rate, regular rhythm. Good peripheral circulation. Grossly normal heart sounds. Well appearing HD fistula in the RUE.  Respiratory: Normal respiratory effort.  No retractions. Lungs CTAB. Gastrointestinal: Soft and nontender. No distention.  Musculoskeletal: No gross deformities of extremities. Neurologic:  Normal speech and language. No gross focal neurologic deficits are appreciated.  Skin:  Skin is warm, dry and intact. No rash noted.  ____________________________________________   LABS (all labs ordered are listed, but  only abnormal results are displayed)  Labs Reviewed  COMPREHENSIVE METABOLIC PANEL - Abnormal; Notable for the following components:      Result Value   Sodium 134 (*)    Chloride 90 (*)    Glucose, Bld 328 (*)    BUN 64 (*)    Creatinine, Ser 5.93 (*)    Calcium 8.8 (*)    Alkaline Phosphatase 174 (*)    GFR calc non Af Amer 7 (*)    GFR calc Af Amer 8 (*)    Anion gap 18 (*)    All other components within normal limits  CBC - Abnormal; Notable for the following components:   RBC 3.65 (*)    MCV 104.1 (*)    Platelets 124 (*)    All other components within normal limits  BLOOD GAS, VENOUS - Abnormal; Notable for the following components:   pCO2, Ven 42.6 (*)    pO2, Ven 50.7 (*)    All other components within normal limits  SARS CORONAVIRUS 2 BY RT PCR (HOSPITAL ORDER,  [facility])  BETA-HYDROXYBUTYRIC ACID   ____________________________________________  EKG   EKG Interpretation  Date/Time:  Tuesday October 24 2019 23:43:58 EDT Ventricular Rate:  68 PR Interval:    QRS Duration: 83 QT Interval:  474 QTC Calculation: 505 R Axis:   -9 Text Interpretation: Sinus rhythm Anteroseptal infarct, age indeterminate Prolonged QT interval Baseline wander in lead(s) I II aVR aVL V1 No STEMI Confirmed by Nanda Quinton [id number]) on 10/24/2019 11:58:51 PM       ____________________________________________  RADIOLOGY  DG Chest 2 View  Result Date: 10/24/2019 CLINICAL DATA:  Shortness of breath and nausea. EXAM: CHEST - 2 VIEW COMPARISON:  09/27/2019 FINDINGS: Stable chronic cardiomegaly. Unchanged mediastinal contours. No pulmonary edema or acute airspace disease. No pleural effusion or pneumothorax. There are stable chronic compression deformities in the lower thoracic and upper lumbar spine. No acute osseous abnormalities are seen. IMPRESSION: Stable chronic cardiomegaly without acute abnormality. Electronically Signed   By: Keith Rake M.D.   On: 10/24/2019 17:21    ____________________________________________   PROCEDURES  Procedure(s) performed:   Procedures  None  ____________________________________________   INITIAL IMPRESSION / ASSESSMENT AND PLAN / ED COURSE  Pertinent labs & imaging results that were available during my care of the patient were reviewed by me and considered in my medical decision making (see chart for details).   Patient presents to the ED with nausea and back pain. Missed HD today. Labs from triage reviewed with normal potassium. BUN to 64. Patient is not clinically uremic but nausea could be related. No AMS. Glucose elevated with anion gap. Normal CO2. Will send VBG and beta hydroxybutyrate to further evaluate for DKA. Will PO challenge. CXR with no acute findings. Patient's SOB symptoms appear  more chronic and intermittent. COVID test will also be obtained. Not seeing a clear indication for emergent HD at this time.    VBG without acidosis and ketones are normal. Patient feeling much better. She is awake, alert, and eating/drinking the ED without difficulty. Discussed need for HD tomorrow. Patient will call first thing to be worked into the schedule. Her son can assist with this and with driving her. Discussed strict ED return precautions. No AMS. No sign of hypertensive emergency. No indication for emergent HD here.  ____________________________________________  FINAL CLINICAL IMPRESSION(S) / ED DIAGNOSES  Final diagnoses:  Nausea     MEDICATIONS GIVEN DURING THIS VISIT:  Medications  ondansetron (ZOFRAN-ODT) disintegrating tablet 4 mg (4 mg Oral Given 10/24/19 2324)     NEW OUTPATIENT MEDICATIONS STARTED DURING THIS VISIT:  New Prescriptions   ONDANSETRON (ZOFRAN ODT) 4 MG DISINTEGRATING TABLET    Take 1 tablet (4 mg total) by mouth every 8 (eight) hours as needed.    Note:  This document was prepared using Dragon voice recognition software and may include unintentional dictation errors.  Nanda Quinton, MD, [facility] Emergency Medicine    Nariyah Osias, [facility], MD 10/25/19 [phone]

## 2019-10-25 DIAGNOSIS — R110 Nausea: Secondary | ICD-10-CM | POA: Diagnosis not present

## 2019-10-25 LAB — BLOOD GAS, VENOUS
Acid-Base Excess: 1.8 mmol/L (ref 0.0–2.0)
Bicarbonate: 25.6 mmol/L (ref 20.0–28.0)
Drawn by: 1528
FIO2: 99
O2 Saturation: 84.6 %
Patient temperature: 36.4
pCO2, Ven: 42.6 mmHg — ABNORMAL LOW (ref 44.0–60.0)
pH, Ven: 7.403 (ref 7.250–7.430)
pO2, Ven: 50.7 mmHg — ABNORMAL HIGH (ref 32.0–45.0)

## 2019-10-25 LAB — SARS CORONAVIRUS 2 BY RT PCR (HOSPITAL ORDER, PERFORMED IN ~~LOC~~ HOSPITAL LAB): SARS Coronavirus 2: NEGATIVE

## 2019-10-25 MED ORDER — ONDANSETRON 4 MG PO TBDP: 4.0000 mg | ORAL_TABLET | Freq: Three times a day (TID) | ORAL | 0 refills | Status: DC | PRN

## 2019-10-25 NOTE — Discharge Instructions (Signed)
You were seen in the ED today with nausea. Your symptoms are well controlled here but you will need dialysis tomorrow. Please call your dialysis center in the morning to see when they can fit you in. Return to the ED with any new or suddenly worsening symptoms.

## 2019-11-03 IMAGING — CT CT RENAL STONE PROTOCOL
2 of 4 series · 14 of 46 positions shown, 16 images · non-contrast
Comparison: CT of the abdomen pelvis dated 12/07/2012

CLINICAL DATA: 61-year-old female with UTI. History of kidney
stones. Displacement of indwelling urethral stent.

EXAM:
CT ABDOMEN AND PELVIS WITHOUT CONTRAST
TECHNIQUE: Multidetector CT imaging of the abdomen and pelvis was performed
following the standard protocol without IV contrast.

[Series 2: axial st · axial · 0.60mm/px · z∈[+811,+1181]mm · 11 of 90 slices shown, 13 images]
[im 8/90  soft-tissue]
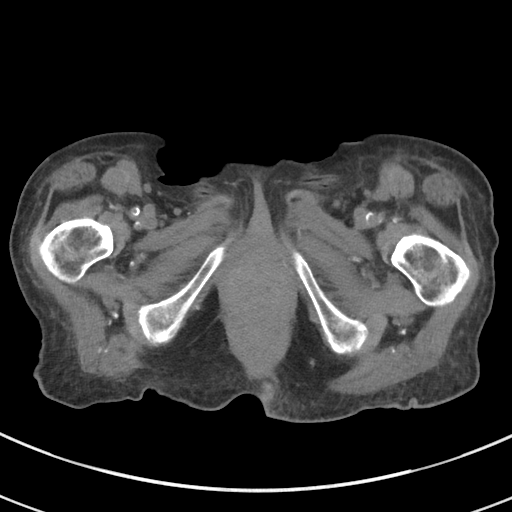
[im 8/90  bone]
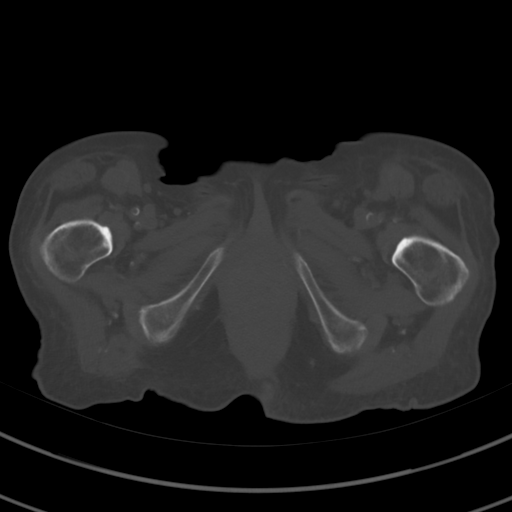
[im 15/90  soft-tissue]
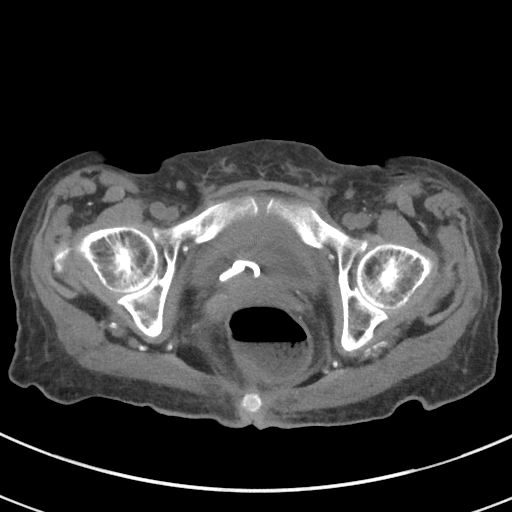
[im 23/90  soft-tissue]
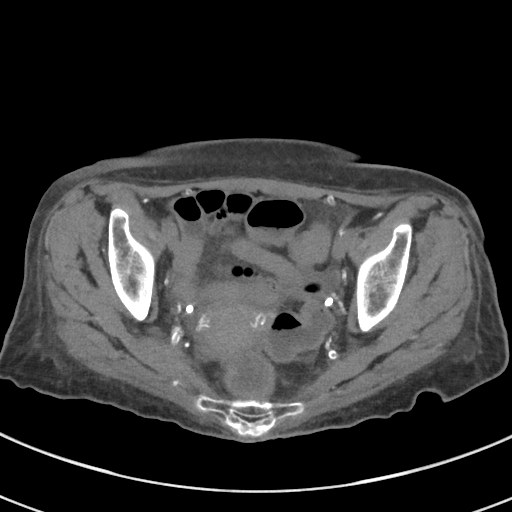
[im 30/90  soft-tissue]
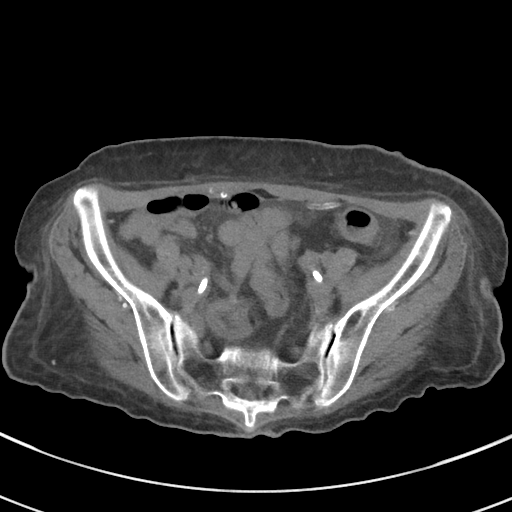
[im 38/90  soft-tissue]
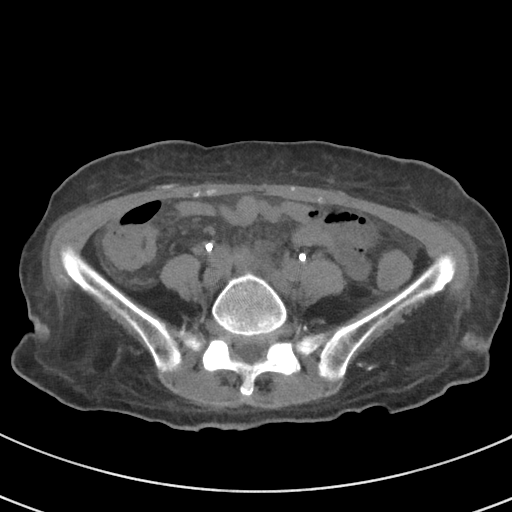
[im 45/90  soft-tissue]
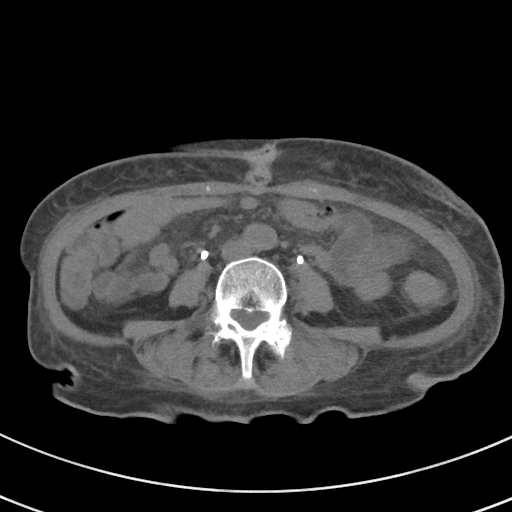
[im 52/90  soft-tissue]
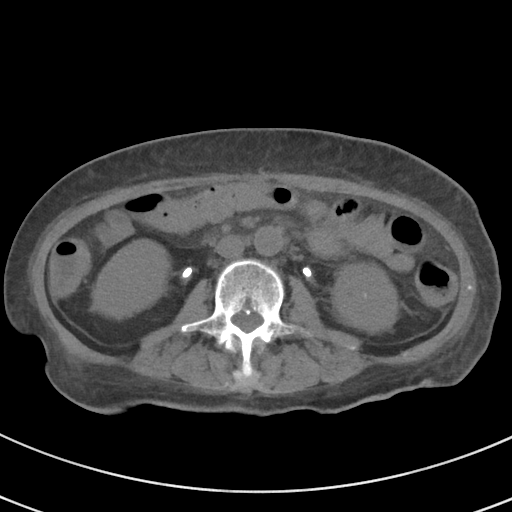
[im 60/90  soft-tissue]
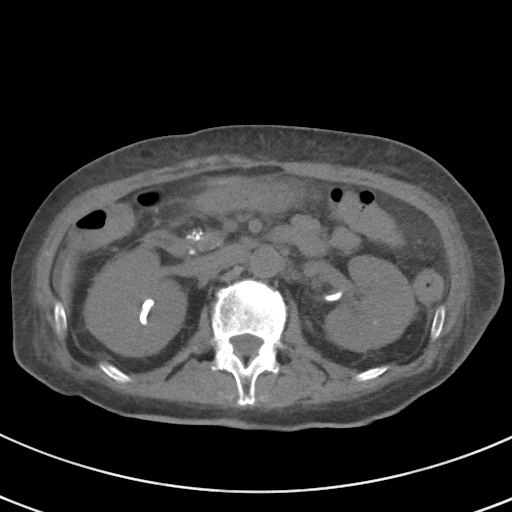
[im 67/90  soft-tissue]
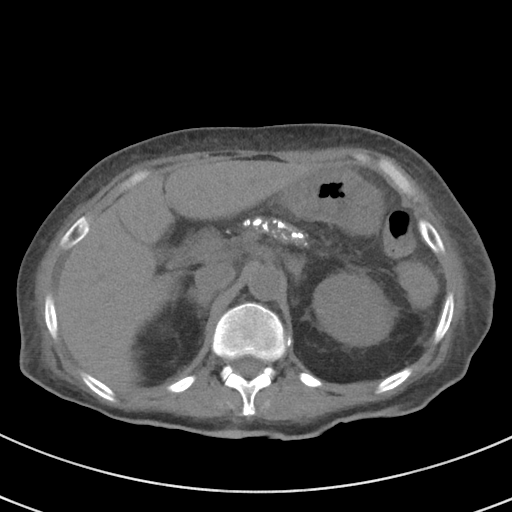
[im 67/90  bone]
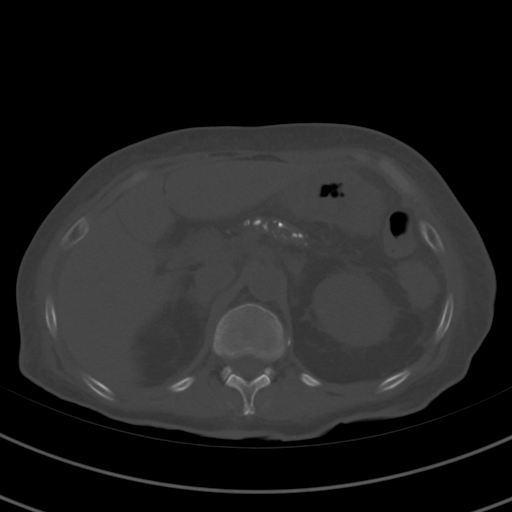
[im 75/90  soft-tissue]
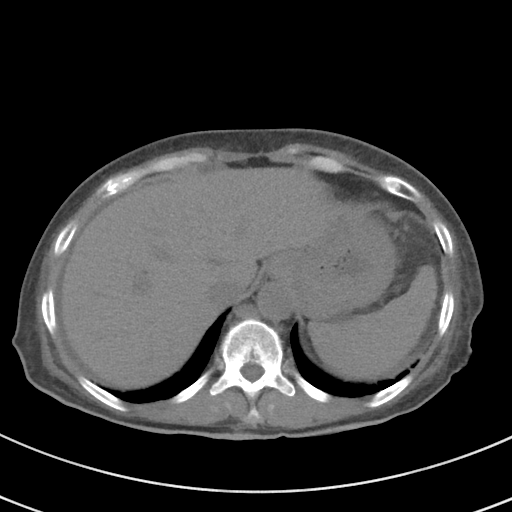
[im 82/90  soft-tissue]
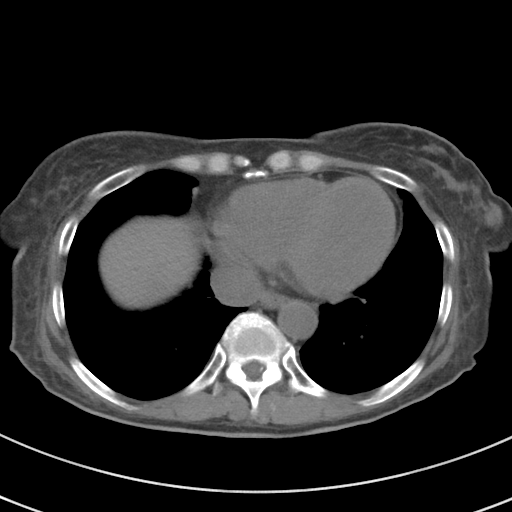

[Series 5: coronal st · coronal · 0.62mm/px · 3 of 65 slices shown]
[im 22/65  soft-tissue]
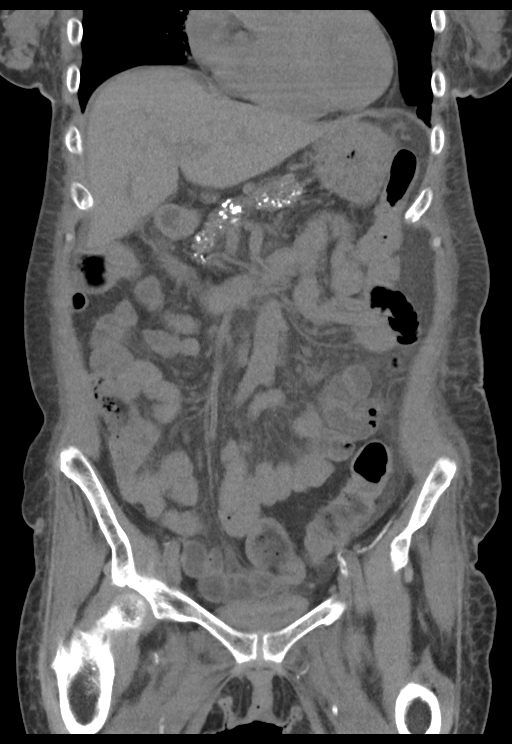
[im 29/65  soft-tissue]
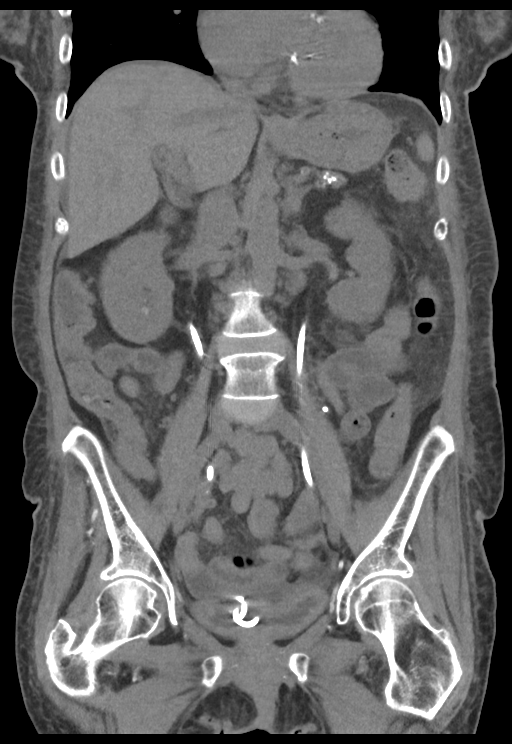
[im 36/65  soft-tissue]
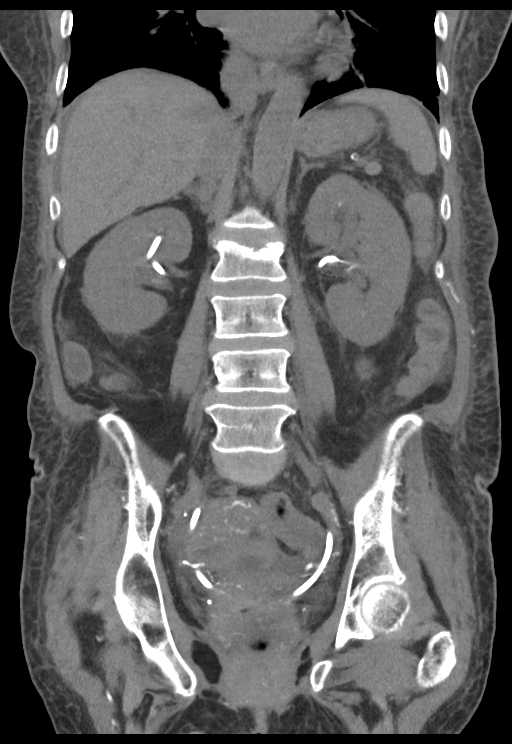

[14 of 46 positions shown; findings below may reference images not displayed]

FINDINGS: Evaluation of this exam is limited in the absence of intravenous
contrast.

Lower chest: Bibasilar linear atelectasis/scarring. There is
calcification of the mitral annulus. There is hypoattenuation of the
cardiac blood pool suggestive of a degree of anemia. Clinical
correlation is recommended.

No intra-abdominal free air. Small ascites and diffuse mesenteric
edema.

Hepatobiliary: The liver is unremarkable. There is mild intrahepatic
biliary ductal dilatation as seen on the prior CT. The gallbladder
is not visualized, likely surgically absent.

Pancreas: The pancreas is atrophic with coarse calcifications likely
sequela of chronic pancreatitis.

Spleen: Normal in size without focal abnormality.

Adrenals/Urinary Tract: The adrenal glands are unremarkable. Small
faint hypodensities within the renal collecting systems on likely
represent small nonobstructing calculi versus residual excreted
contrast from recent IV administration. Clinical correlation is
recommended. Bilateral pigtail ureteral stents noted. The proximal
tip of the right ureteral stent is in the right renal upper pole
collecting system and the proximal tip of the left ureteral stent is
in the left renal pelvis. The distal ends of the stents are within
the urinary bladder. There is no hydronephrosis on either side.

Stomach/Bowel: There is diffuse thickened appearance of the wall of
the stomach as well as thickened appearance of the colon, likely
related to ascites. Gastritis or colitis are considered less likely.
Clinical correlation is recommended. There is no bowel obstruction.
The appendix is normal.

Vascular/Lymphatic: The abdominal aorta and IVC are grossly
unremarkable on this noncontrast CT. No portal venous gas.
Top-normal left retroperitoneal lymph nodes.

Reproductive: Vascular calcification of the peripheral uterus noted.
No pelvic mass.

Other: Diffuse subcutaneous edema and anasarca.

Musculoskeletal: There is degenerative changes of the spine. Grade 1
L4-L5 anterolisthesis. There is compression fracture of the superior
endplate of the L2, age indeterminate but new since the prior CT of
7948. Compression fracture of the inferior endplate of T12 and T10.
The T12 compression fracture is new since the prior CT. There is no
retropulsed fragment.
IMPRESSION: 1. Bilateral ureteral stents in place as described. No
hydronephrosis.
2. Probable small bilateral renal calculi versus residual contrast
from recent study.
3. Small ascites, diffuse edema, and anasarca of indeterminate
etiology. Clinical correlation is recommended.
4. Thickened stomach and colon, likely related to ascites.

## 2019-11-13 ENCOUNTER — Other Ambulatory Visit: Payer: Self-pay

## 2019-11-13 ENCOUNTER — Encounter (HOSPITAL_COMMUNITY): Payer: Self-pay | Admitting: Emergency Medicine

## 2019-11-13 ENCOUNTER — Emergency Department (HOSPITAL_COMMUNITY): Payer: Medicare Other

## 2019-11-13 ENCOUNTER — Inpatient Hospital Stay (HOSPITAL_COMMUNITY)
Admission: EM | Admit: 2019-11-13 | Discharge: 2019-11-19 | DRG: 193 | Disposition: A | Discharge status: Skilled Nursing Facility | Payer: Medicare Other | Attending: Family Medicine | Admitting: Family Medicine

## 2019-11-13 DIAGNOSIS — Z794 Long term (current) use of insulin: Secondary | ICD-10-CM

## 2019-11-13 DIAGNOSIS — E119 Type 2 diabetes mellitus without complications: Secondary | ICD-10-CM | POA: Diagnosis not present

## 2019-11-13 DIAGNOSIS — I639 Cerebral infarction, unspecified: Secondary | ICD-10-CM | POA: Diagnosis not present

## 2019-11-13 DIAGNOSIS — D631 Anemia in chronic kidney disease: Secondary | ICD-10-CM | POA: Diagnosis present

## 2019-11-13 DIAGNOSIS — E871 Hypo-osmolality and hyponatremia: Secondary | ICD-10-CM | POA: Diagnosis present

## 2019-11-13 DIAGNOSIS — I5030 Unspecified diastolic (congestive) heart failure: Secondary | ICD-10-CM | POA: Diagnosis not present

## 2019-11-13 DIAGNOSIS — E860 Dehydration: Secondary | ICD-10-CM

## 2019-11-13 DIAGNOSIS — R0689 Other abnormalities of breathing: Secondary | ICD-10-CM | POA: Diagnosis not present

## 2019-11-13 DIAGNOSIS — K3184 Gastroparesis: Secondary | ICD-10-CM | POA: Diagnosis present

## 2019-11-13 DIAGNOSIS — E1143 Type 2 diabetes mellitus with diabetic autonomic (poly)neuropathy: Secondary | ICD-10-CM | POA: Diagnosis present

## 2019-11-13 DIAGNOSIS — N186 End stage renal disease: Secondary | ICD-10-CM | POA: Diagnosis present

## 2019-11-13 DIAGNOSIS — E875 Hyperkalemia: Secondary | ICD-10-CM

## 2019-11-13 DIAGNOSIS — Y95 Nosocomial condition: Secondary | ICD-10-CM | POA: Diagnosis present

## 2019-11-13 DIAGNOSIS — I5032 Chronic diastolic (congestive) heart failure: Secondary | ICD-10-CM

## 2019-11-13 DIAGNOSIS — Z720 Tobacco use: Secondary | ICD-10-CM | POA: Diagnosis not present

## 2019-11-13 DIAGNOSIS — R531 Weakness: Secondary | ICD-10-CM | POA: Diagnosis not present

## 2019-11-13 DIAGNOSIS — Z681 Body mass index (BMI) 19 or less, adult: Secondary | ICD-10-CM

## 2019-11-13 DIAGNOSIS — E785 Hyperlipidemia, unspecified: Secondary | ICD-10-CM | POA: Diagnosis present

## 2019-11-13 DIAGNOSIS — I132 Hypertensive heart and chronic kidney disease with heart failure and with stage 5 chronic kidney disease, or end stage renal disease: Secondary | ICD-10-CM | POA: Diagnosis not present

## 2019-11-13 DIAGNOSIS — R569 Unspecified convulsions: Secondary | ICD-10-CM

## 2019-11-13 DIAGNOSIS — J189 Pneumonia, unspecified organism: Principal | ICD-10-CM | POA: Diagnosis present

## 2019-11-13 DIAGNOSIS — Z992 Dependence on renal dialysis: Secondary | ICD-10-CM

## 2019-11-13 DIAGNOSIS — G40909 Epilepsy, unspecified, not intractable, without status epilepticus: Secondary | ICD-10-CM | POA: Diagnosis present

## 2019-11-13 DIAGNOSIS — Z833 Family history of diabetes mellitus: Secondary | ICD-10-CM

## 2019-11-13 DIAGNOSIS — E8889 Other specified metabolic disorders: Secondary | ICD-10-CM | POA: Diagnosis present

## 2019-11-13 DIAGNOSIS — R0600 Dyspnea, unspecified: Secondary | ICD-10-CM

## 2019-11-13 DIAGNOSIS — E1165 Type 2 diabetes mellitus with hyperglycemia: Secondary | ICD-10-CM | POA: Diagnosis not present

## 2019-11-13 DIAGNOSIS — J440 Chronic obstructive pulmonary disease with acute lower respiratory infection: Secondary | ICD-10-CM | POA: Diagnosis present

## 2019-11-13 DIAGNOSIS — D539 Nutritional anemia, unspecified: Secondary | ICD-10-CM | POA: Diagnosis present

## 2019-11-13 DIAGNOSIS — R112 Nausea with vomiting, unspecified: Secondary | ICD-10-CM

## 2019-11-13 DIAGNOSIS — F17200 Nicotine dependence, unspecified, uncomplicated: Secondary | ICD-10-CM | POA: Diagnosis not present

## 2019-11-13 DIAGNOSIS — E1122 Type 2 diabetes mellitus with diabetic chronic kidney disease: Secondary | ICD-10-CM | POA: Diagnosis present

## 2019-11-13 DIAGNOSIS — R627 Adult failure to thrive: Secondary | ICD-10-CM | POA: Diagnosis present

## 2019-11-13 DIAGNOSIS — R519 Headache, unspecified: Secondary | ICD-10-CM | POA: Diagnosis present

## 2019-11-13 DIAGNOSIS — Z515 Encounter for palliative care: Secondary | ICD-10-CM | POA: Diagnosis not present

## 2019-11-13 DIAGNOSIS — Z7189 Other specified counseling: Secondary | ICD-10-CM | POA: Diagnosis not present

## 2019-11-13 DIAGNOSIS — Z20822 Contact with and (suspected) exposure to covid-19: Secondary | ICD-10-CM | POA: Diagnosis present

## 2019-11-13 DIAGNOSIS — Z79899 Other long term (current) drug therapy: Secondary | ICD-10-CM

## 2019-11-13 DIAGNOSIS — F0390 Unspecified dementia without behavioral disturbance: Secondary | ICD-10-CM | POA: Diagnosis present

## 2019-11-13 DIAGNOSIS — N2581 Secondary hyperparathyroidism of renal origin: Secondary | ICD-10-CM | POA: Diagnosis present

## 2019-11-13 DIAGNOSIS — Z8673 Personal history of transient ischemic attack (TIA), and cerebral infarction without residual deficits: Secondary | ICD-10-CM

## 2019-11-13 LAB — CBC
HCT: 34.8 % — ABNORMAL LOW (ref 36.0–46.0)
Hemoglobin: 10.5 g/dL — ABNORMAL LOW (ref 12.0–15.0)
MCH: 34.2 pg — ABNORMAL HIGH (ref 26.0–34.0)
MCHC: 30.2 g/dL (ref 30.0–36.0)
MCV: 113.4 fL — ABNORMAL HIGH (ref 80.0–100.0)
Platelets: 172 10*3/uL (ref 150–400)
RBC: 3.07 MIL/uL — ABNORMAL LOW (ref 3.87–5.11)
RDW: 11.9 % (ref 11.5–15.5)
WBC: 8.7 10*3/uL (ref 4.0–10.5)
nRBC: 0 % (ref 0.0–0.2)

## 2019-11-13 LAB — BASIC METABOLIC PANEL
Anion gap: 11 (ref 5–15)
BUN: 36 mg/dL — ABNORMAL HIGH (ref 8–23)
CO2: 27 mmol/L (ref 22–32)
Calcium: 9 mg/dL (ref 8.9–10.3)
Chloride: 96 mmol/L — ABNORMAL LOW (ref 98–111)
Creatinine, Ser: 4.18 mg/dL — ABNORMAL HIGH (ref 0.44–1.00)
GFR calc Af Amer: 12 mL/min — ABNORMAL LOW (ref >60)
GFR calc non Af Amer: 11 mL/min — ABNORMAL LOW (ref >60)
Glucose, Bld: 133 mg/dL — ABNORMAL HIGH (ref 70–99)
Potassium: 6.5 mmol/L (ref 3.5–5.1)
Sodium: 134 mmol/L — ABNORMAL LOW (ref 135–145)

## 2019-11-13 LAB — CBG MONITORING, ED: Glucose-Capillary: 133 mg/dL — ABNORMAL HIGH (ref 70–99)

## 2019-11-13 MED ORDER — CARBOXYMETHYLCELLULOSE SODIUM 1 % OP SOLN: 1.0000 [drp] | Freq: Every day | OPHTHALMIC | Status: DC | PRN

## 2019-11-13 MED ORDER — SODIUM CHLORIDE 0.9 % IV SOLN: 8.0000 mg/h | INTRAVENOUS | Status: DC

## 2019-11-13 MED ORDER — FOLIC ACID 1 MG PO TABS
1.0000 mg | ORAL_TABLET | Freq: Every morning | ORAL | Status: DC
  Administered 2019-11-14 – 2019-11-19 (×6): 1 mg via ORAL
  Filled 2019-11-13 (×6): qty 1

## 2019-11-13 MED ORDER — ALBUTEROL SULFATE HFA 108 (90 BASE) MCG/ACT IN AERS
4.0000 | INHALATION_SPRAY | Freq: Once | RESPIRATORY_TRACT | Status: AC
  Administered 2019-11-13: 4 via RESPIRATORY_TRACT
  Filled 2019-11-13: qty 6.7

## 2019-11-13 MED ORDER — CINACALCET HCL 30 MG PO TABS
30.0000 mg | ORAL_TABLET | Freq: Every evening | ORAL | Status: DC
  Administered 2019-11-14 – 2019-11-18 (×5): 30 mg via ORAL
  Filled 2019-11-13 (×5): qty 1

## 2019-11-13 MED ORDER — INSULIN ASPART 100 UNIT/ML IV SOLN
5.0000 [IU] | Freq: Once | INTRAVENOUS | Status: AC
  Administered 2019-11-13: 5 [IU] via INTRAVENOUS

## 2019-11-13 MED ORDER — VITAMIN D (ERGOCALCIFEROL) 1.25 MG (50000 UNIT) PO CAPS
50000.0000 [IU] | ORAL_CAPSULE | ORAL | Status: DC
  Administered 2019-11-17: 50000 [IU] via ORAL
  Filled 2019-11-13 (×2): qty 1

## 2019-11-13 MED ORDER — DEXTROSE 50 % IV SOLN
1.0000 | Freq: Once | INTRAVENOUS | Status: AC
  Administered 2019-11-13: 50 mL via INTRAVENOUS
  Filled 2019-11-13: qty 50

## 2019-11-13 MED ORDER — NIFEDIPINE ER OSMOTIC RELEASE 30 MG PO TB24
30.0000 mg | ORAL_TABLET | Freq: Every morning | ORAL | Status: DC
  Administered 2019-11-14 – 2019-11-19 (×6): 30 mg via ORAL
  Filled 2019-11-13 (×6): qty 1

## 2019-11-13 MED ORDER — HEPARIN SODIUM (PORCINE) 5000 UNIT/ML IJ SOLN
5000.0000 [IU] | Freq: Three times a day (TID) | INTRAMUSCULAR | Status: DC
  Administered 2019-11-14 – 2019-11-19 (×16): 5000 [IU] via SUBCUTANEOUS
  Filled 2019-11-13 (×16): qty 1

## 2019-11-13 MED ORDER — VANCOMYCIN HCL 750 MG/150ML IV SOLN
750.0000 mg | Freq: Once | INTRAVENOUS | Status: AC
  Administered 2019-11-14: 750 mg via INTRAVENOUS
  Filled 2019-11-13 (×2): qty 150

## 2019-11-13 MED ORDER — ACETAMINOPHEN 325 MG PO TABS
650.0000 mg | ORAL_TABLET | ORAL | Status: DC | PRN
  Administered 2019-11-15 – 2019-11-18 (×3): 650 mg via ORAL
  Filled 2019-11-13 (×3): qty 2

## 2019-11-13 MED ORDER — SODIUM CHLORIDE 0.9 % IV SOLN
1.0000 g | Freq: Once | INTRAVENOUS | Status: AC
  Administered 2019-11-14: 1 g via INTRAVENOUS
  Filled 2019-11-13 (×2): qty 1

## 2019-11-13 MED ORDER — CALCIUM GLUCONATE-NACL 1-0.675 GM/50ML-% IV SOLN
1.0000 g | Freq: Once | INTRAVENOUS | Status: AC
  Administered 2019-11-14: 1000 mg via INTRAVENOUS
  Filled 2019-11-13: qty 50

## 2019-11-13 MED ORDER — FERRIC CITRATE 1 GM 210 MG(FE) PO TABS
420.0000 mg | ORAL_TABLET | Freq: Three times a day (TID) | ORAL | Status: DC
  Administered 2019-11-14 – 2019-11-18 (×10): 420 mg via ORAL
  Filled 2019-11-13 (×19): qty 2

## 2019-11-13 MED ORDER — POLYETHYLENE GLYCOL 3350 17 G PO PACK
17.0000 g | PACK | Freq: Every day | ORAL | Status: DC
  Administered 2019-11-14 – 2019-11-16 (×3): 17 g via ORAL
  Filled 2019-11-13 (×3): qty 1

## 2019-11-13 MED ORDER — LEVETIRACETAM 500 MG PO TABS
1000.0000 mg | ORAL_TABLET | Freq: Every morning | ORAL | Status: DC
  Administered 2019-11-14 – 2019-11-19 (×6): 1000 mg via ORAL
  Filled 2019-11-13 (×7): qty 2

## 2019-11-13 MED ORDER — ALBUTEROL SULFATE (2.5 MG/3ML) 0.083% IN NEBU: 10.0000 mg | INHALATION_SOLUTION | Freq: Once | RESPIRATORY_TRACT | Status: DC

## 2019-11-13 MED ORDER — TOPIRAMATE 25 MG PO TABS
25.0000 mg | ORAL_TABLET | Freq: Two times a day (BID) | ORAL | Status: DC
  Administered 2019-11-14 – 2019-11-19 (×12): 25 mg via ORAL
  Filled 2019-11-13 (×12): qty 1

## 2019-11-13 MED ORDER — CALCITRIOL 0.25 MCG PO CAPS
0.2500 ug | ORAL_CAPSULE | ORAL | Status: DC
  Administered 2019-11-14 – 2019-11-18 (×3): 0.25 ug via ORAL
  Filled 2019-11-13 (×4): qty 1

## 2019-11-13 MED ORDER — SODIUM CHLORIDE 0.9 % IV SOLN
80.0000 mg | Freq: Once | INTRAVENOUS | Status: AC
  Administered 2019-11-14: 80 mg via INTRAVENOUS
  Filled 2019-11-13: qty 80

## 2019-11-13 MED ORDER — ROPINIROLE HCL 1 MG PO TABS
1.0000 mg | ORAL_TABLET | Freq: Three times a day (TID) | ORAL | Status: DC
  Administered 2019-11-14 – 2019-11-19 (×16): 1 mg via ORAL
  Filled 2019-11-13 (×18): qty 1

## 2019-11-13 MED ORDER — SODIUM CHLORIDE 0.9 % IV BOLUS
500.0000 mL | Freq: Once | INTRAVENOUS | Status: AC
  Administered 2019-11-13: 500 mL via INTRAVENOUS

## 2019-11-13 MED ORDER — ALBUTEROL SULFATE HFA 108 (90 BASE) MCG/ACT IN AERS: 2.0000 | INHALATION_SPRAY | Freq: Four times a day (QID) | RESPIRATORY_TRACT | Status: DC

## 2019-11-13 MED ORDER — ALBUTEROL SULFATE HFA 108 (90 BASE) MCG/ACT IN AERS: 2.0000 | INHALATION_SPRAY | RESPIRATORY_TRACT | Status: DC | PRN

## 2019-11-13 MED ORDER — ONDANSETRON HCL 4 MG PO TABS
4.0000 mg | ORAL_TABLET | Freq: Four times a day (QID) | ORAL | Status: DC | PRN
  Administered 2019-11-15: 4 mg via ORAL
  Filled 2019-11-13: qty 1

## 2019-11-13 MED ORDER — PANTOPRAZOLE SODIUM 40 MG IV SOLR
40.0000 mg | Freq: Two times a day (BID) | INTRAVENOUS | Status: DC
  Administered 2019-11-17 – 2019-11-19 (×4): 40 mg via INTRAVENOUS
  Filled 2019-11-13 (×5): qty 40

## 2019-11-13 MED ORDER — ONDANSETRON 4 MG PO TBDP: 4.0000 mg | ORAL_TABLET | Freq: Three times a day (TID) | ORAL | Status: DC | PRN

## 2019-11-13 MED ORDER — SODIUM ZIRCONIUM CYCLOSILICATE 5 G PO PACK
5.0000 g | PACK | Freq: Once | ORAL | Status: AC
  Administered 2019-11-13: 5 g via ORAL
  Filled 2019-11-13: qty 1

## 2019-11-13 MED ORDER — METOCLOPRAMIDE HCL 5 MG/ML IJ SOLN
5.0000 mg | Freq: Once | INTRAMUSCULAR | Status: AC
  Administered 2019-11-14: 5 mg via INTRAVENOUS
  Filled 2019-11-13: qty 2

## 2019-11-13 MED ORDER — LOPERAMIDE HCL 2 MG PO CAPS
4.0000 mg | ORAL_CAPSULE | Freq: Every day | ORAL | Status: DC | PRN
  Filled 2019-11-13 (×2): qty 2

## 2019-11-13 MED ORDER — CARVEDILOL 3.125 MG PO TABS
6.2500 mg | ORAL_TABLET | Freq: Two times a day (BID) | ORAL | Status: DC
  Administered 2019-11-14 – 2019-11-19 (×12): 6.25 mg via ORAL
  Filled 2019-11-13 (×13): qty 2

## 2019-11-13 MED ORDER — PHENYTOIN 50 MG PO CHEW
300.0000 mg | CHEWABLE_TABLET | Freq: Every day | ORAL | Status: DC
  Administered 2019-11-14 – 2019-11-19 (×6): 300 mg via ORAL
  Filled 2019-11-13 (×6): qty 6

## 2019-11-13 NOTE — H&P (Signed)
History and Physical  Erica [name] IOX:[id number] DOB: [birth year] DOA: 11/13/2019  Referring physician: Carmin Muskrat, MD PCP: [name], NP  Patient coming from: Home  Chief Complaint: Generalized weakness  HPI: Erica [name] is a 64 y.o. female with medical history significant for seizures, ESRD on HD on TTS, type II DM, COPD, diastolic CHF, stroke and hypertension who presents to the emergency department due to worsening weakness within last 3 days.  Patient was unable to provide details about her illness, history was obtained from daughter and son (by phone) as well as from ED physician and ED chart.  Per report, patient has about 2 week history of complaining of intermittent weakness and occasional vomiting which appears to be worse after dialysis, patient was reported to present with worsening weakness after dialysis on Saturday (9/25), so she presented to her PCP today who noted that she was dehydrated and advised her to go to the ED for rehydration.  Patient lives with son, ambulates with a walker, she does not smoke, but she dips tobacco.  She had an episode of vomiting while in the ED which was tested for occult blood, but was negative.  She denies fever, chills, cough, worsening shortness of breath or any sick contact.  ED Course:  In the emergency department, BP was 173/78, but other vitals are within normal range.  Work-up in the ED showed macrocytic anemia, hyperkalemia, BUN to creatinine 36/4.18 and hyperglycemia.  Chest x-ray was suggestive of mild left basilar atelectasis and/or infiltrate and small left pleural effusion.  She was suspected to have HCAP (recently admitted from 8/11-8/13 due to UTI, nausea and vomiting) and was empirically started on IV vancomycin and cefepime.  Calcium gluconate was given to stabilize the heart due to hyperkalemia, she was also treated with IV insulin and D50, as well as with Ventolin.  Hospitalist was asked to admit.  For further  evaluation and management.  Review of Systems: Constitutional: Negative for chills and fever.  HENT: Negative for ear pain and sore throat.   Eyes: Negative for pain and visual disturbance.  Respiratory: Negative for cough, chest tightness and shortness of breath.   Cardiovascular: Negative for chest pain and palpitations.  Gastrointestinal: Positive for nausea and vomiting.    Endocrine: Negative for polyphagia and polyuria.  Genitourinary: Negative for decreased urine volume, dysuria, enuresis Musculoskeletal: Negative for arthralgias and back pain.  Skin: Negative for color change and rash.  Allergic/Immunologic: Negative for immunocompromised state.  Neurological: Positive for weakness.  Negative for tremors, syncope, speech difficulty, light-headedness and headaches.  Hematological: Does not bruise/bleed easily.  All other systems reviewed and are negative    Past Medical History:  Diagnosis Date   Alcohol-induced pancreatitis    Chronic diarrhea    Depression    Diabetes mellitus    fasting blood sugar 268-341D   Diastolic CHF (HCC)    DKA (diabetic ketoacidoses) (HCC)    Gastroparesis    GERD (gastroesophageal reflux disease)    Heart murmur    History of kidney stones    Hyperlipidemia    Hypertension    Hypokalemia    Muscle spasm    Neuropathic pain    Neuropathy    Hx: of   Pyelonephritis    Seizures ([place])    Vitamin B12 deficiency    Vitamin D deficiency    Past Surgical History:  Procedure Laterality Date   AV FISTULA PLACEMENT Left 12/15/2017   Procedure: INSERTION OF ARTERIOVENOUS (AV) GORE-TEX GRAFT  ARM;  Surgeon: Rosetta Posner, MD;  Location: [facility] OR;  Service: Vascular;  Laterality: Left;   CATARACT EXTRACTION W/PHACO Right 03/14/2015   Procedure: CATARACT EXTRACTION PHACO AND INTRAOCULAR LENS PLACEMENT ([place]);  Surgeon: Baruch Goldmann, MD;  Location: [facility];  Service: Ophthalmology;  Laterality: Right;  CDE:11.13   CATARACT  EXTRACTION W/PHACO Left 04/11/2015   Procedure: CATARACT EXTRACTION PHACO AND INTRAOCULAR LENS PLACEMENT LEFT EYE CDE=9.68;  Surgeon: Baruch Goldmann, MD;  Location: [facility];  Service: Ophthalmology;  Laterality: Left;   COLONOSCOPY  02/24/2010   cystoscopy with ureteral stent  Bilateral 10/07/2017   At [place] CV LINE RIGHT  11/17/2017   IR FLUORO GUIDE CV LINE RIGHT  11/22/2017   IR REMOVAL TUN CV CATH W/O FL  01/26/2018   IR US GUIDE VASC ACCESS RIGHT  11/17/2017   MULTIPLE EXTRACTIONS WITH ALVEOLOPLASTY N/A 06/13/2012   Procedure: MULTIPLE EXTRACION WITH ALVEOLOPLASTY EXTRACT: 18, 19, 20, 21, 22, 24, 25, 27, 28, 29, 30, 31;  Surgeon: Gae Bon, DDS;  Location: [place];  Service: Oral Surgery;  Laterality: N/A;   ORIF HUMERUS FRACTURE Right 04/20/2019   Procedure: OPEN REDUCTION INTERNAL FIXATION (ORIF) PROXIMAL HUMERUS FRACTURE;  Surgeon: Marchia Bond, MD;  Location: [place];  Service: Orthopedics;  Laterality: Right;   TUBAL LIGATION     URETERAL STENT PLACEMENT  09/2017    Social History:  reports that she has never smoked. Her smokeless tobacco use includes snuff. She reports previous alcohol use of about 1.0 standard drink of alcohol per week. She reports that she does not use drugs.   Allergies  Allergen Reactions   Aspirin Palpitations    Listed on [facility] 11/12/18    Family History  Problem Relation Age of Onset   Diabetes Sister    Chronic Renal Failure Neg Hx    Prior to Admission medications   Medication Sig Start Date End Date Taking? Authorizing Provider  acetaminophen (TYLENOL) 325 MG tablet Take 650 mg by mouth every 4 (four) hours as needed for moderate pain.   Yes [provider]  albuterol (PROAIR HFA) 108 (90 Base) MCG/ACT inhaler Inhale 2 puffs into the lungs every 4 (four) hours as needed for wheezing or shortness of breath.    Yes [provider]  calcitRIOL (ROCALTROL) 0.25 MCG capsule Take 1 capsule (0.25 mcg  total) by mouth Every Tuesday,Thursday,and Saturday with dialysis. 12/18/17  Yes Love, Ivan Anchors, PA-C  carboxymethylcellulose (ARTIFICIAL TEARS) 1 % ophthalmic solution Place 1 drop into both eyes daily as needed (dry eyes).   Yes [provider]  carvedilol (COREG) 6.25 MG tablet Take 1 tablet (6.25 mg total) by mouth 2 (two) times daily. 11/15/18  Yes Buriev, Arie Sabina, MD  Emollient (CETAPHIL) cream Apply 1 application topically every 12 (twelve) hours as needed (dry skin).   Yes [provider]  folic acid (FOLVITE) 1 MG tablet Take 1 mg by mouth every morning.   Yes [provider]  insulin lispro (HUMALOG) 100 UNIT/ML injection Inject 2-10 Units into the skin 4 (four) times daily. Per sliding scale: CBG 151-200 2 units, 201-250 4 units, 251-300 6 units, 301-350 8 units, 351-400 10 units,   Yes [provider]  levETIRAcetam (KEPPRA) 1000 MG tablet Take 1,000 mg by mouth every morning.   Yes [provider]  loperamide (IMODIUM) 2 MG capsule Take 4 mg by mouth daily as needed for diarrhea or loose stools.   Yes  [provider]  NIFEdipine (PROCARDIA-XL/NIFEDICAL-XL) 30 MG 24 hr tablet Take 30 mg by mouth every morning. 07/10/19  Yes [provider]  ondansetron (ZOFRAN ODT) 4 MG disintegrating tablet Take 1 tablet (4 mg total) by mouth every 8 (eight) hours as needed. 10/25/19  Yes Long, Wonda Olds, MD  ondansetron (ZOFRAN) 4 MG tablet Take 1 tablet (4 mg total) by mouth every 6 (six) hours as needed for nausea. 09/29/19  Yes Shah, Pratik D, DO  Oxycodone HCl 10 MG TABS Take 1 mg by mouth daily as needed (pain).  08/09/19  Yes [provider]  polyethylene glycol (MIRALAX) 17 g packet Take 17 g by mouth daily. 08/10/19  Yes Tacy Learn, PA-C  rOPINIRole (REQUIP) 1 MG tablet Take 1 mg by mouth 3 (three) times daily.   Yes [provider]  senna (SENOKOT) 8.6 MG TABS tablet Take 8.6 mg by mouth in the morning and at bedtime.    Yes [provider]  Vitamin D, Ergocalciferol, (DRISDOL) 1.25 MG (50000 UT) CAPS capsule Take 50,000 Units by mouth every Friday.   Yes [provider]  cinacalcet (SENSIPAR) 30 MG tablet Take 30 mg by mouth every evening.    [provider]  dextrose (GLUTOSE) 40 % GEL Take 1 Tube by mouth every 10 (ten) minutes as needed for low blood sugar (unitl blood sugar is above 70).    [provider]  ferric citrate (AURYXIA) 1 GM 210 MG(Fe) tablet Take 2 tablets (420 mg total) by mouth 3 (three) times daily with meals. 11/15/18   Kinnie Feil, MD  phenytoin (DILANTIN INFATABS) 50 MG tablet Chew 6 tablets (300 mg total) by mouth daily. 09/29/19 10/29/19  Manuella Ghazi, Pratik D, DO  topiramate (TOPAMAX) 25 MG tablet Take 1 tablet (25 mg total) by mouth 2 (two) times daily. 11/29/17 11/12/19  Arrien, Jimmy Picket, MD    Physical Exam: BP (!) 149/74    Pulse 78    Temp 97.6 F (36.4 C) (Oral)    Resp 15    Ht 5' (1.524 m)    Wt 44 kg    SpO2 94%    BMI 18.94 kg/m    General: 64 y.o. year-old female ill appearing, but in no acute distress.  Alert and oriented x3.  HEENT: Dry mucous membrane.  NCAT, EOMI  Cardiovascular: Regular rate and rhythm with no rubs or gallops.  No thyromegaly or JVD noted.  2/4 pulses in all 4 extremities.  Respiratory: Clear to auscultation with no wheezes or rales. Good inspiratory effort.  Abdomen: Soft nontender nondistended with normal bowel sounds x4 quadrants.  Muskuloskeletal: No cyanosis, +2 LE edema to midshin noted bilaterally  Neuro: CN II-XII intact, strength, sensation, reflexes  Skin: No ulcerative lesions noted or rashes  Psychiatry: Judgement and insight appear normal. Mood is appropriate for condition and setting          Labs on Admission:  Basic Metabolic Panel: Recent Labs  Lab 11/13/19 1743  NA 134*  K 6.5*  CL 96*  CO2 27  GLUCOSE 133*  BUN 36*  CREATININE 4.18*  CALCIUM 9.0   Liver Function  Tests: No results for input(s): AST, ALT, ALKPHOS, BILITOT, PROT, ALBUMIN in the last 168 hours. No results for input(s): LIPASE, AMYLASE in the last 168 hours. No results for input(s): AMMONIA in the last 168 hours. CBC: Recent Labs  Lab 11/13/19 1743  WBC 8.7  HGB 10.5*  HCT 34.8*  MCV 113.4*  PLT 172   Cardiac Enzymes: No results for input(s): CKTOTAL, CKMB, CKMBINDEX, TROPONINI in the last 168 hours.  BNP (last 3 results) No results for input(s): BNP in the last 8760 hours.  ProBNP (last 3 results) No results for input(s): PROBNP in the last 8760 hours.  CBG: Recent Labs  Lab 11/13/19 1631  GLUCAP 133*    Radiological Exams on Admission: DG Chest Port 1 View  Result Date: 11/13/2019 CLINICAL DATA:  Weakness and diarrhea x2 days. EXAM: PORTABLE CHEST 1 VIEW COMPARISON:  October 24, 2019 FINDINGS: Mild diffuse chronic appearing increased lung markings are seen. Mild atelectasis and/or infiltrate is noted within the left lung base. There is a small left pleural effusion. No pneumothorax is identified. The cardiac silhouette is mildly enlarged and unchanged in size. Radiopaque fixation pins are seen within the right humeral head. IMPRESSION: 1. Mild left basilar atelectasis and/or infiltrate. 2. Small left pleural effusion. Electronically Signed   By: Virgina Norfolk M.D.   On: 11/13/2019 22:21    EKG: I independently viewed the EKG done and my findings are as followed: Normal sinus rhythm at a rate of 76 bpm  Assessment/Plan Present on Admission:  Hyperkalemia  Nausea & vomiting  Dehydration  Chronic diastolic congestive heart failure (HCC)  ESRD (end stage renal disease) (HCC)  Stroke (HCC)  Tobacco abuse  Principal Problem:   Hyperkalemia Active Problems:   Nausea & vomiting   Dehydration   Tobacco abuse   Chronic diastolic congestive heart failure (HCC)   Stroke (HCC)   Seizures (HCC)   Diabetes mellitus type 2 in nonobese (HCC)   ESRD (end  stage renal disease) (HCC)   Generalized weakness   HCAP (healthcare-associated pneumonia)  Generalized weakness possibly secondary to multifactorial including dehydration, hyperkalemia K+ 6.5, calcium gluconate was given IV insulin IV D50,albuterol treatment and Lokelma were given Gentle hydration with IV NS 500cc was given in the ED. Nephrology will be consulted so that patient can have dialysis in the morning  Nausea and vomiting Continue IV Zofran 4 mg every 6 hours as needed  ESRD on HD (TTS) K 6.5.  Nephrology consult placed so that patient can have dialysis in the morning Continue Sensipar and vitamin D  Presumed HCAP POA Chest x-ray was suggestive of mild left basilar atelectasis and/or infiltrate and small left pleural effusion. Patient denies cough, fever, chills, shortness of breath She was empirically started on IV vancomycin and cefepime, we shall continue with same at this time with plan to de-escalate based on procalcitonin level, blood culture and sputum culture Blood culture, sputum culture and urinalysis pending PORT/PSI of 104 points  indicating 8.2-9.3% mortality Continue Mucinex, incentive spirometry, flutter valve   Type 2 diabetes mellitus Continue insulin sliding scale and hypoglycemia protocol  Hypertension Continue Coreg and nifedipine  Seizures Continue Keppra, phenytoin and Topamax  Tobacco abuse Patient counseled on tobacco abuse cessation and she acknowledged understanding discussion.  DVT prophylaxis: Heparin   Code Status: Full code  Family Communication: Son [name]) and daughter Kerry Fort- [phone]); all questions were answered to satisfaction  Disposition Plan:  Patient is from:                        home Anticipated DC to:                   SNF or family members home Anticipated DC date:  2-3 days Anticipated DC barriers:          Patient unstable for discharge at this time due to generalized weakness  and electrolyte abnormalities and need for dialysis in the morning, she is also currently receiving IV antibiotics for presumed HCAP POA  Consults called: Nephrology  Admission status: Inpatient    Bernadette Hoit MD [facility]  If 7PM-7AM, please contact night-coverage [email or web address] Password [email or web address]  11/14/2019, 1:26 AM

## 2019-11-13 NOTE — ED Provider Notes (Addendum)
[facility] EMERGENCY DEPARTMENT Provider Note   CSN: [id number] Arrival date & time: 11/13/19  1607     History Chief Complaint  Patient presents with  . Weakness    Erica [name] is a 64 y.o. female.  HPI    Patient with multiple medical issues including end-stage renal disease, on dialysis presents with 3 days of weakness Patient's time course is unclear, but it seems as though for past 2 days at least she has had generalized weakness, nausea.  She may have had vomiting or diarrhea, though she gives different comes to me and nursing staff. No report of fever, no chest pain, no dyspnea. Symptoms began after her last dialysis session.  Today, with persistent symptoms she spoke with her physician and was sent here for evaluation. No relief with anything.  No focal pain. Past Medical History:  Diagnosis Date  . Alcohol-induced pancreatitis   . Chronic diarrhea   . Depression   . Diabetes mellitus    fasting blood sugar 110-120s  . Diastolic CHF ([place])   . DKA (diabetic ketoacidoses) ([place])   . Gastroparesis   . GERD (gastroesophageal reflux disease)   . Heart murmur   . History of kidney stones   . Hyperlipidemia   . Hypertension   . Hypokalemia   . Muscle spasm   . Neuropathic pain   . Neuropathy    Hx: of  . Pyelonephritis   . Seizures ([place])   . Vitamin B12 deficiency   . Vitamin D deficiency     Patient Active Problem List   Diagnosis Date Noted  . Hyponatremia 09/27/2019  . Closed fracture of right proximal humerus 04/20/2019  . Surgery, elective   . Seizure ([place]) 11/12/2018  . History of hydronephrosis --stents in place 12/08/2017  . Stroke ([place]) 11/29/2017  . Seizures ([place])   . Hydronephrosis   . Recurrent UTI   . Diabetes mellitus type 2 in nonobese (HCC)   . ESRD (end stage renal disease) ([place])   . Anemia of chronic disease   . Chronic diastolic congestive heart failure ([place])   . Essential hypertension   . PICC (peripherally inserted central  catheter) in place   . Acute pyelonephritis 11/12/2017  . Uncontrolled type 2 diabetes mellitus with hyperglycemia, with long-term current use of insulin ([place]) 11/10/2017  . Renal failure 11/08/2017  . Seizure disorder ([place]) 11/08/2017  . Orthostatic hypotension 07/25/2014  . Moderate protein malnutrition ([place]) 09/15/2013  . Aspiration pneumonia ([place]) 09/15/2013  . Acute respiratory failure requiring reintubation ([place]) 09/11/2013  . probable Seizures due to metabolic disorder 81/82/9937  . Lactic acidosis 03/19/2013  . Abdominal pain 03/19/2013  . Rotavirus infection 10/29/2012  . Type II or unspecified type diabetes mellitus without mention of complication, uncontrolled 10/29/2012  . Protein-calorie malnutrition, severe ([place]) 10/27/2012  . NSTEMI (non-ST elevated myocardial infarction) ([place]) 10/26/2012  . Fever, unspecified 10/26/2012  . Hypotension 10/25/2012  . Metabolic acidosis 16/96/7893  . Chronic diarrhea 10/25/2012  . Tobacco abuse 10/25/2012  . DKA (diabetic ketoacidoses) ([place]) 09/09/2012  . Dehydration 09/09/2012  . DKA, type 2 ([place]) 05/20/2012  . Abnormal LFTs 05/20/2012  . Heart murmur, systolic 81/02/7508  . Hypoglycemia 07/08/2011  . Metabolic encephalopathy 25/85/2778  . Alcohol abuse 07/08/2011  . Hypokalemia 07/08/2011  . Nausea & vomiting 07/08/2011  . H/O chronic pancreatitis 07/08/2011    Past Surgical History:  Procedure Laterality Date  . AV FISTULA PLACEMENT Left 12/15/2017   Procedure: INSERTION OF ARTERIOVENOUS (AV) GORE-TEX GRAFT  ARM;  Surgeon: Rosetta Posner, MD;  Location: [facility];  Service: Vascular;  Laterality: Left;  . CATARACT EXTRACTION W/PHACO Right 03/14/2015   Procedure: CATARACT EXTRACTION PHACO AND INTRAOCULAR LENS PLACEMENT (IOC);  Surgeon: Baruch Goldmann, MD;  Location: [facility];  Service: Ophthalmology;  Laterality: Right;  CDE:11.13  . CATARACT EXTRACTION W/PHACO Left 04/11/2015   Procedure: CATARACT EXTRACTION PHACO AND INTRAOCULAR LENS  PLACEMENT LEFT EYE CDE=9.68;  Surgeon: Baruch Goldmann, MD;  Location: [facility];  Service: Ophthalmology;  Laterality: Left;  . COLONOSCOPY  02/24/2010  . cystoscopy with ureteral stent  Bilateral 10/07/2017   At [place] CV LINE RIGHT  11/17/2017  . IR FLUORO GUIDE CV LINE RIGHT  11/22/2017  . IR REMOVAL TUN CV CATH W/O FL  01/26/2018  . IR US GUIDE VASC ACCESS RIGHT  11/17/2017  . MULTIPLE EXTRACTIONS WITH ALVEOLOPLASTY N/A 06/13/2012   Procedure: MULTIPLE EXTRACION WITH ALVEOLOPLASTY EXTRACT: 18, 19, 20, 21, 22, 24, 25, 27, 28, 29, 30, 31;  Surgeon: Gae Bon, DDS;  Location: [facility];  Service: Oral Surgery;  Laterality: N/A;  . ORIF HUMERUS FRACTURE Right 04/20/2019   Procedure: OPEN REDUCTION INTERNAL FIXATION (ORIF) PROXIMAL HUMERUS FRACTURE;  Surgeon: Marchia Bond, MD;  Location: [facility];  Service: Orthopedics;  Laterality: Right;  . TUBAL LIGATION    . URETERAL STENT PLACEMENT  09/2017     OB History    Gravida      Para      Term      Preterm      AB      Living  2     SAB      TAB      Ectopic      Multiple      Live Births              Family History  Problem Relation Age of Onset  . Diabetes Sister   . Chronic Renal Failure Neg Hx     Social History   Tobacco Use  . Smoking status: Never Smoker  . Smokeless tobacco: Current User    Types: Snuff  Vaping Use  . Vaping Use: Never used  Substance Use Topics  . Alcohol use: Not Currently    Alcohol/week: 1.0 standard drink    Types: 1 Cans of beer per week  . Drug use: No    Home Medications Prior to Admission medications   Medication Sig Start Date End Date Taking? Authorizing Provider  acetaminophen (TYLENOL) 325 MG tablet Take 650 mg by mouth every 4 (four) hours as needed for moderate pain.   Yes [provider]  albuterol (PROAIR HFA) 108 (90 Base) MCG/ACT inhaler Inhale 2 puffs into the lungs every 4 (four) hours as needed for wheezing or shortness of  breath.    Yes [provider]  calcitRIOL (ROCALTROL) 0.25 MCG capsule Take 1 capsule (0.25 mcg total) by mouth Every Tuesday,Thursday,and Saturday with dialysis. 12/18/17  Yes Love, Ivan Anchors, PA-C  carboxymethylcellulose (ARTIFICIAL TEARS) 1 % ophthalmic solution Place 1 drop into both eyes daily as needed (dry eyes).   Yes [provider]  carvedilol (COREG) 6.25 MG tablet Take 1 tablet (6.25 mg total) by mouth 2 (two) times daily. 11/15/18  Yes Buriev, Arie Sabina, MD  Emollient (CETAPHIL) cream Apply 1 application topically every 12 (twelve) hours as needed (dry skin).   Yes [provider]  folic acid (FOLVITE) 1 MG tablet Take  1 mg by mouth every morning.   Yes [provider]  insulin lispro (HUMALOG) 100 UNIT/ML injection Inject 2-10 Units into the skin 4 (four) times daily. Per sliding scale: CBG 151-200 2 units, 201-250 4 units, 251-300 6 units, 301-350 8 units, 351-400 10 units,   Yes [provider]  levETIRAcetam (KEPPRA) 1000 MG tablet Take 1,000 mg by mouth every morning.   Yes [provider]  loperamide (IMODIUM) 2 MG capsule Take 4 mg by mouth daily as needed for diarrhea or loose stools.   Yes [provider]  NIFEdipine (PROCARDIA-XL/NIFEDICAL-XL) 30 MG 24 hr tablet Take 30 mg by mouth every morning. 07/10/19  Yes [provider]  ondansetron (ZOFRAN ODT) 4 MG disintegrating tablet Take 1 tablet (4 mg total) by mouth every 8 (eight) hours as needed. 10/25/19  Yes Long, Wonda Olds, MD  ondansetron (ZOFRAN) 4 MG tablet Take 1 tablet (4 mg total) by mouth every 6 (six) hours as needed for nausea. 09/29/19  Yes Shah, Pratik D, DO  Oxycodone HCl 10 MG TABS Take 1 mg by mouth daily as needed (pain).  08/09/19  Yes [provider]  polyethylene glycol (MIRALAX) 17 g packet Take 17 g by mouth daily. 08/10/19  Yes Tacy Learn, PA-C  rOPINIRole (REQUIP) 1 MG tablet Take 1 mg by mouth 3 (three) times daily.   Yes  [provider]  senna (SENOKOT) 8.6 MG TABS tablet Take 8.6 mg by mouth in the morning and at bedtime.   Yes [provider]  Vitamin D, Ergocalciferol, (DRISDOL) 1.25 MG (50000 UT) CAPS capsule Take 50,000 Units by mouth every Friday.   Yes [provider]  cinacalcet (SENSIPAR) 30 MG tablet Take 30 mg by mouth every evening.    [provider]  dextrose (GLUTOSE) 40 % GEL Take 1 Tube by mouth every 10 (ten) minutes as needed for low blood sugar (unitl blood sugar is above 70).    [provider]  ferric citrate (AURYXIA) 1 GM 210 MG(Fe) tablet Take 2 tablets (420 mg total) by mouth 3 (three) times daily with meals. 11/15/18   Kinnie Feil, MD  phenytoin (DILANTIN INFATABS) 50 MG tablet Chew 6 tablets (300 mg total) by mouth daily. 09/29/19 10/29/19  Manuella Ghazi, Pratik D, DO  topiramate (TOPAMAX) 25 MG tablet Take 1 tablet (25 mg total) by mouth 2 (two) times daily. 11/29/17 11/12/19  Arrien, Jimmy Picket, MD    Allergies    Aspirin  Review of Systems   Review of Systems  Constitutional:       Per HPI, otherwise negative  HENT:       Per HPI, otherwise negative  Respiratory:       Per HPI, otherwise negative  Cardiovascular:       Per HPI, otherwise negative  Gastrointestinal: Positive for nausea. Negative for abdominal pain and vomiting.  Endocrine:       Negative aside from HPI  Genitourinary:       Neg aside from HPI   Musculoskeletal:       Per HPI, otherwise negative  Skin: Negative.   Allergic/Immunologic: Positive for immunocompromised state.  Neurological: Positive for weakness. Negative for syncope.    Physical Exam Updated Vital Signs BP (!) 173/78 (BP Location: Right Arm)   Pulse 73   Temp 97.6 F (36.4 C) (Oral)   Resp 18   Ht 5' (1.524 m)   Wt 44 kg   SpO2 92%   BMI 18.94  kg/m   Physical Exam Vitals and nursing note reviewed.  Constitutional:      Appearance: She is well-developed. She is ill-appearing.    HENT:     Head: Normocephalic and atraumatic.  Eyes:     Conjunctiva/sclera: Conjunctivae normal.  Cardiovascular:     Rate and Rhythm: Normal rate and regular rhythm.  Pulmonary:     Effort: Pulmonary effort is normal. No respiratory distress.     Breath sounds: Normal breath sounds. No stridor.  Abdominal:     General: There is no distension.     Tenderness: There is no abdominal tenderness. There is no guarding.  Skin:    General: Skin is warm and dry.  Neurological:     Mental Status: She is alert and oriented to person, place, and time.     Cranial Nerves: No cranial nerve deficit.     Motor: Atrophy present.      ED Results / Procedures / Treatments   Labs (all labs ordered are listed, but only abnormal results are displayed) Labs Reviewed  BASIC METABOLIC PANEL - Abnormal; Notable for the following components:      Result Value   Sodium 134 (*)    Potassium 6.5 (*)    Chloride 96 (*)    Glucose, Bld 133 (*)    BUN 36 (*)    Creatinine, Ser 4.18 (*)    GFR calc non Af Amer 11 (*)    GFR calc Af Amer 12 (*)    All other components within normal limits  CBC - Abnormal; Notable for the following components:   RBC 3.07 (*)    Hemoglobin 10.5 (*)    HCT 34.8 (*)    MCV 113.4 (*)    MCH 34.2 (*)    All other components within normal limits  CBG MONITORING, ED - Abnormal; Notable for the following components:   Glucose-Capillary 133 (*)    All other components within normal limits  RESPIRATORY PANEL BY RT PCR (FLU A&B, COVID)  URINALYSIS, ROUTINE W REFLEX MICROSCOPIC    EKG EKG Interpretation  Date/Time:  Monday November 13 2019 16:34:14 EDT Ventricular Rate:  76 PR Interval:  162 QRS Duration: 68 QT Interval:  406 QTC Calculation: 456 R Axis:   -26 Text Interpretation: Normal sinus rhythm Low voltage QRS Borderline ECG Confirmed by Carmin Muskrat [phone]) on 11/13/2019 4:44:24 PM   Radiology DG Chest Port 1 View  Result Date: 11/13/2019 CLINICAL  DATA:  Weakness and diarrhea x2 days. EXAM: PORTABLE CHEST 1 VIEW COMPARISON:  October 24, 2019 FINDINGS: Mild diffuse chronic appearing increased lung markings are seen. Mild atelectasis and/or infiltrate is noted within the left lung base. There is a small left pleural effusion. No pneumothorax is identified. The cardiac silhouette is mildly enlarged and unchanged in size. Radiopaque fixation pins are seen within the right humeral head. IMPRESSION: 1. Mild left basilar atelectasis and/or infiltrate. 2. Small left pleural effusion. Electronically Signed   By: Virgina Norfolk M.D.   On: 11/13/2019 22:21    Procedures Procedures (including critical care time)  Medications Ordered in ED Medications  insulin aspart (novoLOG) injection 5 Units (has no administration in time range)    And  dextrose 50 % solution 50 mL (has no administration in time range)  sodium zirconium cyclosilicate (LOKELMA) packet 5 g (has no administration in time range)  sodium chloride 0.9 % bolus 500 mL (has no administration in time range)  albuterol (VENTOLIN HFA) 108 (90 Base) MCG/ACT  inhaler 4 puff (has no administration in time range)    ED Course  I have reviewed the triage vital signs and the nursing notes.  Pertinent labs & imaging results that were available during my care of the patient were reviewed by me and considered in my medical decision making (see chart for details).     Initial labs notable for elevated potassium, 6.5. EKG without substantial changes, but given this substantial abnormality, patient has received albuterol, will receive insulin, dextrose, Lokelma, fluids.  10:40 PM Patient in similar condition, now on discussing her findings including abnormal x-ray suggesting pneumonia, patient notes that she has had a mild cough as well. With consideration of her cough, pneumonia, immunocompromise status she will start antibiotics per Covid test has been sent. Given the commendation of  pneumonia, as above with hyperkalemia she will require admission for ongoing resuscitation, meds as above.   10:51 PM Patient has had an episode of vomiting, dark material. With consideration of bleed, gastric occult ordered, Protonix ordered.  11:00 PM Gastric hemoccult negative.  No additional emesis MDM Rules/Calculators/A&P MDM Number of Diagnoses or Management Options HCAP (healthcare-associated pneumonia): new, needed workup Hyperkalemia: new, needed workup   Amount and/or Complexity of Data Reviewed Clinical lab tests: reviewed Tests in the radiology section of CPT: reviewed Tests in the medicine section of CPT: reviewed Discussion of test results with the performing providers: yes Decide to obtain previous medical records or to obtain history from someone other than the patient: yes Obtain history from someone other than the patient: yes Review and summarize past medical records: yes Discuss the patient with other providers: yes Independent visualization of images, tracings, or specimens: yes  Risk of Complications, Morbidity, and/or Mortality Presenting problems: high Diagnostic procedures: high Management options: high  Critical Care Total time providing critical care: 30-74 minutes (35)  Patient Progress Patient progress: stable   Final Clinical Impression(s) / ED Diagnoses Final diagnoses:  HCAP (healthcare-associated pneumonia)  Hyperkalemia     Carmin Muskrat, MD 11/13/19 2241    Carmin Muskrat, MD 11/13/19 2252    Carmin Muskrat, MD 11/13/19 2300

## 2019-11-13 NOTE — Progress Notes (Addendum)
Pharmacy Antibiotic Note  Erica [name] is a 64 y.o. female admitted on 11/13/2019 with pneumonia.  Pharmacy has been consulted for vancomycin and cefepime dosing.  ESRD on HD. CXR suggests pneumonia. Noted low weight 44kg.   Plan: Vancomycin 750mg  once then 250mg  post-HD  -F/u inpatient HD schedule (was MWF outpatient) and order dose  Cefepime 1g Daily post-HD Monitor cultures, clinical status, renal fx, vanc levels Narrow abx as able and f/u duration    Height: 5' (152.4 cm) Weight: 44 kg (97 lb) IBW/kg (Calculated) : 45.5  Temp (24hrs), Avg:98.2 F (36.8 C), Min:97.6 F (36.4 C), Max:98.7 F (37.1 C)  Recent Labs  Lab 11/13/19 1743  WBC 8.7  CREATININE 4.18*    Estimated Creatinine Clearance: 9.4 mL/min (A) (by C-G formula based on SCr of 4.18 mg/dL (H)).    Allergies  Allergen Reactions  . Aspirin Palpitations    Listed on [facility] 11/12/18   Antimicrobials this admission: Vanc 9/27 >>  Cefe 9/27 >>  Microbiology results: Orders pending   11/14/2018 MRSA neg  Thank you for allowing pharmacy to be a part of this patient's care.  Benetta Spar, PharmD, BCPS, BCCP Clinical Pharmacist  Please check AMION for all [place] phone numbers After 10:00 PM, call [place] [phone]

## 2019-11-13 NOTE — ED Notes (Signed)
Gastric occult blood sample is negative.

## 2019-11-13 NOTE — ED Notes (Signed)
Date and time results received: 11/13/19 1853  Test: Potassium Critical Value: 6.5  Name of Provider Notified: Dr. Vanita Panda  Orders Received? Or Actions Taken?: EDP requesting pt be in room if possible.

## 2019-11-13 NOTE — ED Notes (Signed)
Gave pt OJ per hospitalist recommendation.

## 2019-11-13 NOTE — ED Triage Notes (Signed)
Pt reports generalized weakness, diarrhea x2 days. Pt reports lower back pain. Last dialysis treatment on Saturday. cbg with EMS 189. Pt alert and oriented.

## 2019-11-14 ENCOUNTER — Other Ambulatory Visit: Payer: Self-pay

## 2019-11-14 DIAGNOSIS — R531 Weakness: Secondary | ICD-10-CM

## 2019-11-14 DIAGNOSIS — J189 Pneumonia, unspecified organism: Secondary | ICD-10-CM

## 2019-11-14 LAB — CBC
HCT: 27.3 % — ABNORMAL LOW (ref 36.0–46.0)
Hemoglobin: 8.3 g/dL — ABNORMAL LOW (ref 12.0–15.0)
MCH: 34.2 pg — ABNORMAL HIGH (ref 26.0–34.0)
MCHC: 30.4 g/dL (ref 30.0–36.0)
MCV: 112.3 fL — ABNORMAL HIGH (ref 80.0–100.0)
Platelets: 164 10*3/uL (ref 150–400)
RBC: 2.43 MIL/uL — ABNORMAL LOW (ref 3.87–5.11)
RDW: 11.9 % (ref 11.5–15.5)
WBC: 7.2 10*3/uL (ref 4.0–10.5)
nRBC: 0 % (ref 0.0–0.2)

## 2019-11-14 LAB — CBG MONITORING, ED
Glucose-Capillary: 87 mg/dL (ref 70–99)
Glucose-Capillary: 89 mg/dL (ref 70–99)
Glucose-Capillary: 133 mg/dL — ABNORMAL HIGH (ref 70–99)
Glucose-Capillary: 106 mg/dL — ABNORMAL HIGH (ref 70–99)

## 2019-11-14 LAB — IRON AND TIBC
Iron: 47 ug/dL (ref 28–170)
Saturation Ratios: 17 % (ref 10.4–31.8)
TIBC: 271 ug/dL (ref 250–450)
UIBC: 224 ug/dL

## 2019-11-14 LAB — COMPREHENSIVE METABOLIC PANEL
ALT: 48 U/L — ABNORMAL HIGH (ref 0–44)
AST: 41 U/L (ref 15–41)
Albumin: 3.3 g/dL — ABNORMAL LOW (ref 3.5–5.0)
Alkaline Phosphatase: 198 U/L — ABNORMAL HIGH (ref 38–126)
Anion gap: 11 (ref 5–15)
BUN: 40 mg/dL — ABNORMAL HIGH (ref 8–23)
CO2: 25 mmol/L (ref 22–32)
Calcium: 8.4 mg/dL — ABNORMAL LOW (ref 8.9–10.3)
Chloride: 98 mmol/L (ref 98–111)
Creatinine, Ser: 4.48 mg/dL — ABNORMAL HIGH (ref 0.44–1.00)
GFR calc Af Amer: 11 mL/min — ABNORMAL LOW (ref >60)
GFR calc non Af Amer: 10 mL/min — ABNORMAL LOW (ref >60)
Glucose, Bld: 94 mg/dL (ref 70–99)
Potassium: 6.2 mmol/L — ABNORMAL HIGH (ref 3.5–5.1)
Sodium: 134 mmol/L — ABNORMAL LOW (ref 135–145)
Total Bilirubin: 1.3 mg/dL — ABNORMAL HIGH (ref 0.3–1.2)
Total Protein: 6.7 g/dL (ref 6.5–8.1)

## 2019-11-14 LAB — PHOSPHORUS: Phosphorus: 4 mg/dL (ref 2.5–4.6)

## 2019-11-14 LAB — PROTIME-INR
INR: 1.2 (ref 0.8–1.2)
Prothrombin Time: 14.4 seconds (ref 11.4–15.2)

## 2019-11-14 LAB — RESPIRATORY PANEL BY RT PCR (FLU A&B, COVID)
Influenza A by PCR: NEGATIVE
Influenza B by PCR: NEGATIVE
SARS Coronavirus 2 by RT PCR: NEGATIVE

## 2019-11-14 LAB — GLUCOSE, CAPILLARY: Glucose-Capillary: 145 mg/dL — ABNORMAL HIGH (ref 70–99)

## 2019-11-14 LAB — HIV ANTIBODY (ROUTINE TESTING W REFLEX): HIV Screen 4th Generation wRfx: NONREACTIVE

## 2019-11-14 LAB — PROCALCITONIN: Procalcitonin: 0.38 ng/mL

## 2019-11-14 LAB — FERRITIN: Ferritin: 801 ng/mL — ABNORMAL HIGH (ref 11–307)

## 2019-11-14 LAB — MAGNESIUM: Magnesium: 2.6 mg/dL — ABNORMAL HIGH (ref 1.7–2.4)

## 2019-11-14 LAB — APTT: aPTT: 29 seconds (ref 24–36)

## 2019-11-14 MED ORDER — INSULIN ASPART 100 UNIT/ML ~~LOC~~ SOLN
0.0000 [IU] | Freq: Three times a day (TID) | SUBCUTANEOUS | Status: DC
  Administered 2019-11-17: 2 [IU] via SUBCUTANEOUS
  Administered 2019-11-17: 3 [IU] via SUBCUTANEOUS
  Administered 2019-11-18: 1 [IU] via SUBCUTANEOUS
  Administered 2019-11-18: 3 [IU] via SUBCUTANEOUS
  Administered 2019-11-19 (×2): 1 [IU] via SUBCUTANEOUS

## 2019-11-14 MED ORDER — VANCOMYCIN HCL 500 MG/100ML IV SOLN
500.0000 mg | INTRAVENOUS | Status: DC
  Administered 2019-11-14: 500 mg via INTRAVENOUS
  Filled 2019-11-14: qty 100

## 2019-11-14 MED ORDER — INFLUENZA VAC SPLIT QUAD 0.5 ML IM SUSY
0.5000 mL | PREFILLED_SYRINGE | INTRAMUSCULAR | Status: DC
  Filled 2019-11-14: qty 0.5

## 2019-11-14 MED ORDER — SODIUM CHLORIDE 0.9 % IV SOLN
1.0000 g | INTRAVENOUS | Status: DC
  Filled 2019-11-14 (×3): qty 1

## 2019-11-14 MED ORDER — NEPRO/CARBSTEADY PO LIQD
237.0000 mL | Freq: Two times a day (BID) | ORAL | Status: DC
  Administered 2019-11-15 – 2019-11-19 (×9): 237 mL via ORAL

## 2019-11-14 MED ORDER — DM-GUAIFENESIN ER 30-600 MG PO TB12
1.0000 | ORAL_TABLET | Freq: Two times a day (BID) | ORAL | Status: DC
  Administered 2019-11-14 – 2019-11-19 (×11): 1 via ORAL
  Filled 2019-11-14 (×12): qty 1

## 2019-11-14 MED ORDER — PNEUMOCOCCAL VAC POLYVALENT 25 MCG/0.5ML IJ INJ: 0.5000 mL | INJECTION | INTRAMUSCULAR | Status: DC

## 2019-11-14 MED ORDER — INSULIN ASPART 100 UNIT/ML ~~LOC~~ SOLN
0.0000 [IU] | Freq: Every day | SUBCUTANEOUS | Status: DC
  Administered 2019-11-16: 2 [IU] via SUBCUTANEOUS

## 2019-11-14 MED ORDER — POLYVINYL ALCOHOL 1.4 % OP SOLN
1.0000 [drp] | OPHTHALMIC | Status: DC | PRN
  Filled 2019-11-14: qty 15

## 2019-11-14 MED ORDER — CHLORHEXIDINE GLUCONATE CLOTH 2 % EX PADS
6.0000 | MEDICATED_PAD | Freq: Every day | CUTANEOUS | Status: DC
  Administered 2019-11-15 – 2019-11-19 (×5): 6 via TOPICAL

## 2019-11-14 MED ORDER — DARBEPOETIN ALFA 60 MCG/0.3ML IJ SOSY
60.0000 ug | PREFILLED_SYRINGE | INTRAMUSCULAR | Status: DC
  Administered 2019-11-14: 60 ug via INTRAVENOUS
  Filled 2019-11-14: qty 0.3

## 2019-11-14 MED ORDER — SODIUM CHLORIDE 0.9 % IV SOLN: INTRAVENOUS | Status: DC | PRN

## 2019-11-14 NOTE — TOC Initial Note (Signed)
Transition of Care [facility]) - Initial/Assessment Note    Patient Details  Name: Erica [name] MRN: [id number] Date of Birth: 07/03/1955  Transition of Care [employer]) CM/SW Contact:    Salome Arnt, LCSW Phone Number: 11/14/2019, 8:59 AM  Clinical Narrative:  Pt admitted due to hyperkalemia. Assessment completed due to high risk readmission score. Pt indicates she is feeling very nauseous this morning. She lives with her son, Erica [name]. Pt states that while Erica [name] is at work her daughter comes to stay with her during the day. Children are very supportive and assist pt with most things she said. She ambulates with a walker at baseline. Pt is on dialysis at [facility] on TTS schedule. She uses [facility] for transportation to dialysis. Pt plans to return home when medically stable. TOC will continue to follow.                  Expected Discharge Plan: Home/Self Care Barriers to Discharge: Continued Medical Work up   Patient Goals and CMS Choice Patient states their goals for this hospitalization and ongoing recovery are:: return home      Expected Discharge Plan and Services Expected Discharge Plan: Home/Self Care In-house Referral: Clinical Social Work     Living arrangements for the past 2 months: Apartment                                      Prior Living Arrangements/Services Living arrangements for the past 2 months: Apartment Lives with:: Adult Children Patient language and need for interpreter reviewed:: Yes Do you feel safe going back to the place where you live?: Yes      Need for Family Participation in Patient Care: Yes (Comment) Care giver support system in place?: Yes (comment) Current home services: DME (walker, cane, wheelchair, 3N1) Criminal Activity/Legal Involvement Pertinent to Current Situation/Hospitalization: No - Comment as needed  Activities of Daily Living      Permission Sought/Granted                  Emotional Assessment    Attitude/Demeanor/Rapport: Other (comment) (Appropriate) Affect (typically observed): Appropriate Orientation: : Oriented to Self, Oriented to Place, Oriented to  Time, Oriented to Situation Alcohol / Substance Use: Not Applicable Psych Involvement: No (comment)  Admission diagnosis:  Hyperkalemia [E87.5] Patient Active Problem List   Diagnosis Date Noted  . Generalized weakness 11/14/2019  . HCAP (healthcare-associated pneumonia) 11/14/2019  . Hyperkalemia 11/13/2019  . Hyponatremia 09/27/2019  . Closed fracture of right proximal humerus 04/20/2019  . Surgery, elective   . Seizure ([place]) 11/12/2018  . History of hydronephrosis --stents in place 12/08/2017  . Stroke ([place]) 11/29/2017  . Seizures ([place])   . Hydronephrosis   . Recurrent UTI   . Diabetes mellitus type 2 in nonobese (HCC)   . ESRD (end stage renal disease) ([place])   . Anemia of chronic disease   . Chronic diastolic congestive heart failure ([place])   . Essential hypertension   . PICC (peripherally inserted central catheter) in place   . Acute pyelonephritis 11/12/2017  . Uncontrolled type 2 diabetes mellitus with hyperglycemia, with long-term current use of insulin ([place]) 11/10/2017  . Renal failure 11/08/2017  . Seizure disorder ([place]) 11/08/2017  . Orthostatic hypotension 07/25/2014  . Moderate protein malnutrition ([place]) 09/15/2013  . Aspiration pneumonia ([place]) 09/15/2013  . Acute respiratory failure requiring reintubation ([place]) 09/11/2013  . probable Seizures due  to metabolic disorder 54/49/2010  . Lactic acidosis 03/19/2013  . Abdominal pain 03/19/2013  . Rotavirus infection 10/29/2012  . Type II or unspecified type diabetes mellitus without mention of complication, uncontrolled 10/29/2012  . Protein-calorie malnutrition, severe ([place]) 10/27/2012  . NSTEMI (non-ST elevated myocardial infarction) ([place]) 10/26/2012  . Fever, unspecified 10/26/2012  . Hypotension 10/25/2012  . Metabolic acidosis 08/27/1973  . Chronic  diarrhea 10/25/2012  . Tobacco abuse 10/25/2012  . DKA (diabetic ketoacidoses) ([place]) 09/09/2012  . Dehydration 09/09/2012  . DKA, type 2 ([place]) 05/20/2012  . Abnormal LFTs 05/20/2012  . Heart murmur, systolic 88/32/5498  . Hypoglycemia 07/08/2011  . Metabolic encephalopathy 26/41/5830  . Alcohol abuse 07/08/2011  . Hypokalemia 07/08/2011  . Nausea & vomiting 07/08/2011  . H/O chronic pancreatitis 07/08/2011   PCP:  [name], NP Pharmacy:   [place], [place] - [address] [place] [address] Phone: [phone] Fax: [place], [place] [address] Phone: [phone] Fax: [phone]  [id number] - [place], [place] [address] Phone: [phone] Fax: [phone]     Social Determinants of Health (SDOH) Interventions    Readmission Risk Interventions Readmission Risk Prevention Plan 11/14/2019  Transportation Screening Complete  Medication Review ([place]) Complete  HRI or [place] Complete  SW Recovery Care/Counseling Consult Complete  [place] Not Applicable  Some recent data might be hidden

## 2019-11-14 NOTE — Consult Note (Addendum)
[facility] Renal Consultation Note    Indication for Consultation:  Management of ESRD/hemodialysis, anemia, hypertension/volume, hyponatremia and secondary hyperparathyroidism.  HPI: Erica [name] is a 64 y.o. female with a history of ESRD on dialysis at [facility] (TTS), BM, CHF, HTN, seizure disorder, and remote alcohol induced pancreatitis, who presented to the emergency department because of ongoing weakness.  The patient has been hospitalized for similar symptoms in the past that were vague and mild.  In the past they have also been associated with poor p.o. intake, nausea, vomiting, abdominal pain. Yesterday she presented with inability to relate any of her symptoms.  Based on call from ED provider to sign it sounds as though she has had intermittent weakness and vomiting that seems to be worse after dialysis for the past couple weeks.  Patient also states that she does have some mild abdominal pain and back pain.  She went to her PCP yesterday who felt she was dehydrated and recommended she go to the emergency department.  After arrival of the patient appeared chronically ill but relatively well with elevated blood pressure.  Potassium was also noted to be elevated and was treated medically.  She was admitted for further management.  Looks like she also went to the emergency department on 11/07/2019 for a seizure that occurred during dialysis.  She recovered in the emergency department at [facility] and it was recommended that she follow-up with her neurologist.  Past Medical History:  Diagnosis Date  . Alcohol-induced pancreatitis   . Chronic diarrhea   . Depression   . Diabetes mellitus    fasting blood sugar 110-120s  . Diastolic CHF ([place])   . DKA (diabetic ketoacidoses) ([place])   . Gastroparesis   . GERD (gastroesophageal reflux disease)   . Heart murmur   . History of kidney stones   . Hyperlipidemia   . Hypertension   . Hypokalemia   . Muscle spasm   .  Neuropathic pain   . Neuropathy    Hx: of  . Pyelonephritis   . Seizures ([place])   . Vitamin B12 deficiency   . Vitamin D deficiency    Past Surgical History:  Procedure Laterality Date  . AV FISTULA PLACEMENT Left 12/15/2017   Procedure: INSERTION OF ARTERIOVENOUS (AV) GORE-TEX GRAFT ARM;  Surgeon: Rosetta Posner, MD;  Location: [place];  Service: Vascular;  Laterality: Left;  . CATARACT EXTRACTION W/PHACO Right 03/14/2015   Procedure: CATARACT EXTRACTION PHACO AND INTRAOCULAR LENS PLACEMENT (IOC);  Surgeon: Baruch Goldmann, MD;  Location: [facility];  Service: Ophthalmology;  Laterality: Right;  CDE:11.13  . CATARACT EXTRACTION W/PHACO Left 04/11/2015   Procedure: CATARACT EXTRACTION PHACO AND INTRAOCULAR LENS PLACEMENT LEFT EYE CDE=9.68;  Surgeon: Baruch Goldmann, MD;  Location: [facility];  Service: Ophthalmology;  Laterality: Left;  . COLONOSCOPY  02/24/2010  . cystoscopy with ureteral stent  Bilateral 10/07/2017   At [place] CV LINE RIGHT  11/17/2017  . IR FLUORO GUIDE CV LINE RIGHT  11/22/2017  . IR REMOVAL TUN CV CATH W/O FL  01/26/2018  . IR US GUIDE VASC ACCESS RIGHT  11/17/2017  . MULTIPLE EXTRACTIONS WITH ALVEOLOPLASTY N/A 06/13/2012   Procedure: MULTIPLE EXTRACION WITH ALVEOLOPLASTY EXTRACT: 18, 19, 20, 21, 22, 24, 25, 27, 28, 29, 30, 31;  Surgeon: Gae Bon, DDS;  Location: [place];  Service: Oral Surgery;  Laterality: N/A;  . ORIF HUMERUS FRACTURE Right 04/20/2019   Procedure: OPEN REDUCTION INTERNAL FIXATION (  ORIF) PROXIMAL HUMERUS FRACTURE;  Surgeon: Marchia Bond, MD;  Location: [place];  Service: Orthopedics;  Laterality: Right;  . TUBAL LIGATION    . URETERAL STENT PLACEMENT  09/2017   Family History  Problem Relation Age of Onset  . Diabetes Sister   . Chronic Renal Failure Neg Hx    Social History:  reports that she has never smoked. Her smokeless tobacco use includes snuff. She reports previous alcohol use of about 1.0 standard drink of alcohol per  week. She reports that she does not use drugs.  ROS: As per HPI otherwise negative.  Physical Exam: Vitals:   11/14/19 0447 11/14/19 0700 11/14/19 0704 11/14/19 0753  BP: (!) 149/80 (!) 175/84  (!) 146/86  Pulse: 79 73  81  Resp: 16 20  16   Temp: 98 F (36.7 C)     TempSrc: Oral     SpO2: 98% (!) 87% 99% 98%  Weight:      Height:         GEN: Thin, frail, chronically ill-appearing ENT: no nasal discharge, mmm EYES: no scleral icterus, eomi CV: normal rate, no murmurs PULM: no iwob, bilateral chest rise ABD: NABS, non-distended SKIN: no rashes or jaundice EXT: no edema, warm and well perfused Dialysis Access: LUE AVG + bruit  Allergies  Allergen Reactions  . Aspirin Palpitations    Listed on [facility] 11/12/18   Prior to Admission medications   Medication Sig Start Date End Date Taking? Authorizing Provider  acetaminophen (TYLENOL) 325 MG tablet Take 1-2 tablets (325-650 mg total) by mouth every 4 (four) hours as needed for mild pain. 12/16/17   Love, Ivan Anchors, PA-C  calcitRIOL (ROCALTROL) 0.25 MCG capsule Take 1 capsule (0.25 mcg total) by mouth Every Tuesday,Thursday,and Saturday with dialysis. 12/18/17   Love, Ivan Anchors, PA-C  carvedilol (COREG) 12.5 MG tablet Take 1 tablet (12.5 mg total) by mouth 2 (two) times daily with a meal. 12/16/17   Love, Ivan Anchors, PA-C  Chlorhexidine Gluconate Cloth 2 % PADS Apply 6 each topically daily at 6 (six) AM. 12/17/17   Love, Ivan Anchors, PA-C  ferric citrate (AURYXIA) 1 GM 210 MG(Fe) tablet Take 2 tablets (420 mg total) by mouth 3 (three) times daily with meals. 12/16/17   Love, Ivan Anchors, PA-C  insulin aspart (NOVOLOG) 100 UNIT/ML injection Inject 0-9 Units into the skin 3 (three) times daily with meals. For glucose 121-150 use one unit, for 151-200 use 2 units, for 201 to 250 use 3 units, for 251-300 use 5 units, for 301-350 use 7 units, for 351 or greater use 9 unnits. 11/29/17 12/29/17  Arrien, Jimmy Picket, MD  insulin glargine (LANTUS) 100  UNIT/ML injection Inject 0.12 mLs (12 Units total) into the skin daily. 12/17/17   Love, Ivan Anchors, PA-C  ondansetron (ZOFRAN ODT) 4 MG disintegrating tablet Take 1 tablet (4 mg total) by mouth every 8 (eight) hours as needed for nausea. 10/18/18   Noemi Chapel, MD  phenytoin (DILANTIN) 125 MG/5ML suspension Take 6 mLs (150 mg total) by mouth 2 (two) times daily. 12/16/17   Love, Ivan Anchors, PA-C  topiramate (TOPAMAX) 25 MG tablet Take 1 tablet (25 mg total) by mouth 2 (two) times daily. 11/29/17 12/29/17  Arrien, Jimmy Picket, MD  traZODone (DESYREL) 50 MG tablet Take 0.5-1 tablets (25-50 mg total) by mouth at bedtime as needed for sleep. 12/16/17   Bary Leriche, PA-C   Current Facility-Administered Medications  Medication Dose Route Frequency Provider Last Rate Last Admin  .  acetaminophen (TYLENOL) tablet 650 mg  650 mg Oral Q4H PRN Adefeso, Oladapo, DO      . albuterol (VENTOLIN HFA) 108 (90 Base) MCG/ACT inhaler 2 puff  2 puff Inhalation Q4H PRN Adefeso, Oladapo, DO      . calcitRIOL (ROCALTROL) capsule 0.25 mcg  0.25 mcg Oral Q T,Th,Sa-HD Adefeso, Oladapo, DO      . carvedilol (COREG) tablet 6.25 mg  6.25 mg Oral BID Adefeso, Oladapo, DO   6.25 mg at 11/14/19 0206  . ceFEPIme (MAXIPIME) 1 g in sodium chloride 0.9 % 100 mL IVPB  1 g Intravenous Q24H [place]e Jude, [facility]      . Chlorhexidine Gluconate Cloth 2 % PADS 6 each  6 each Topical Q0600 Reesa Chew, MD      . cinacalcet (SENSIPAR) tablet 30 mg  30 mg Oral QPM Adefeso, Oladapo, DO      . dextromethorphan-guaiFENesin (MUCINEX DM) 30-600 MG per 12 hr tablet 1 tablet  1 tablet Oral BID Adefeso, Oladapo, DO      . ferric citrate (AURYXIA) tablet 420 mg  420 mg Oral TID WC Adefeso, Oladapo, DO      . folic acid (FOLVITE) tablet 1 mg  1 mg Oral q morning - 10a Adefeso, Oladapo, DO      . heparin injection 5,000 Units  5,000 Units Subcutaneous Q8H Adefeso, Oladapo, DO   5,000 Units at 11/14/19 0541  . insulin aspart (novoLOG) injection 0-5  Units  0-5 Units Subcutaneous QHS Adefeso, Oladapo, DO      . insulin aspart (novoLOG) injection 0-6 Units  0-6 Units Subcutaneous TID WC Adefeso, Oladapo, DO      . levETIRAcetam (KEPPRA) tablet 1,000 mg  1,000 mg Oral q morning - 10a Adefeso, Oladapo, DO      . loperamide (IMODIUM) capsule 4 mg  4 mg Oral Daily PRN Adefeso, Oladapo, DO      . NIFEdipine (PROCARDIA-XL/NIFEDICAL-XL) 24 hr tablet 30 mg  30 mg Oral q morning - 10a Adefeso, Oladapo, DO      . ondansetron (ZOFRAN) tablet 4 mg  4 mg Oral Q6H PRN Adefeso, Oladapo, DO      . ondansetron (ZOFRAN-ODT) disintegrating tablet 4 mg  4 mg Oral Q8H PRN Adefeso, Oladapo, DO      . [START ON 11/17/2019] pantoprazole (PROTONIX) injection 40 mg  40 mg Intravenous Q12H Adefeso, Oladapo, DO      . phenytoin (DILANTIN) chewable tablet 300 mg  300 mg Oral Daily Adefeso, Oladapo, DO      . polyethylene glycol (MIRALAX / GLYCOLAX) packet 17 g  17 g Oral Daily Adefeso, Oladapo, DO      . polyvinyl alcohol (LIQUIFILM TEARS) 1.4 % ophthalmic solution 1 drop  1 drop Both Eyes PRN Emokpae, Courage, MD      . rOPINIRole (REQUIP) tablet 1 mg  1 mg Oral TID Adefeso, Oladapo, DO      . topiramate (TOPAMAX) tablet 25 mg  25 mg Oral BID Adefeso, Oladapo, DO   25 mg at 11/14/19 0057  . [START ON 11/17/2019] Vitamin D (Ergocalciferol) (DRISDOL) capsule 50,000 Units  50,000 Units Oral Q Woody Seller, Oladapo, DO       Current Outpatient Medications  Medication Sig Dispense Refill  . acetaminophen (TYLENOL) 325 MG tablet Take 650 mg by mouth every 4 (four) hours as needed for moderate pain.    Marland Kitchen albuterol (PROAIR HFA) 108 (90 Base) MCG/ACT inhaler Inhale 2 puffs into the lungs every 4 (four)  hours as needed for wheezing or shortness of breath.     . calcitRIOL (ROCALTROL) 0.25 MCG capsule Take 1 capsule (0.25 mcg total) by mouth Every Tuesday,Thursday,and Saturday with dialysis.    . carboxymethylcellulose (ARTIFICIAL TEARS) 1 % ophthalmic solution Place 1 drop into both  eyes daily as needed (dry eyes).    . carvedilol (COREG) 6.25 MG tablet Take 1 tablet (6.25 mg total) by mouth 2 (two) times daily. 60 tablet 0  . Emollient (CETAPHIL) cream Apply 1 application topically every 12 (twelve) hours as needed (dry skin).    . folic acid (FOLVITE) 1 MG tablet Take 1 mg by mouth every morning.    . insulin lispro (HUMALOG) 100 UNIT/ML injection Inject 2-10 Units into the skin 4 (four) times daily. Per sliding scale: CBG 151-200 2 units, 201-250 4 units, 251-300 6 units, 301-350 8 units, 351-400 10 units,    . levETIRAcetam (KEPPRA) 1000 MG tablet Take 1,000 mg by mouth every morning.    . loperamide (IMODIUM) 2 MG capsule Take 4 mg by mouth daily as needed for diarrhea or loose stools.    Marland Kitchen NIFEdipine (PROCARDIA-XL/NIFEDICAL-XL) 30 MG 24 hr tablet Take 30 mg by mouth every morning.    . ondansetron (ZOFRAN ODT) 4 MG disintegrating tablet Take 1 tablet (4 mg total) by mouth every 8 (eight) hours as needed. 20 tablet 0  . ondansetron (ZOFRAN) 4 MG tablet Take 1 tablet (4 mg total) by mouth every 6 (six) hours as needed for nausea. 20 tablet 0  . Oxycodone HCl 10 MG TABS Take 1 mg by mouth daily as needed (pain).     . polyethylene glycol (MIRALAX) 17 g packet Take 17 g by mouth daily. 14 each 0  . rOPINIRole (REQUIP) 1 MG tablet Take 1 mg by mouth 3 (three) times daily.    Marland Kitchen senna (SENOKOT) 8.6 MG TABS tablet Take 8.6 mg by mouth in the morning and at bedtime.    . Vitamin D, Ergocalciferol, (DRISDOL) 1.25 MG (50000 UT) CAPS capsule Take 50,000 Units by mouth every Friday.    . cinacalcet (SENSIPAR) 30 MG tablet Take 30 mg by mouth every evening.    Marland Kitchen dextrose (GLUTOSE) 40 % GEL Take 1 Tube by mouth every 10 (ten) minutes as needed for low blood sugar (unitl blood sugar is above 70).    . ferric citrate (AURYXIA) 1 GM 210 MG(Fe) tablet Take 2 tablets (420 mg total) by mouth 3 (three) times daily with meals. 270 tablet   . phenytoin (DILANTIN INFATABS) 50 MG tablet Chew 6  tablets (300 mg total) by mouth daily. 180 tablet 0  . topiramate (TOPAMAX) 25 MG tablet Take 1 tablet (25 mg total) by mouth 2 (two) times daily. 60 tablet 0   Labs: Basic Metabolic Panel: Recent Labs  Lab 11/13/19 1743 11/14/19 0540  NA 134* 134*  K 6.5* 6.2*  CL 96* 98  CO2 27 25  GLUCOSE 133* 94  BUN 36* 40*  CREATININE 4.18* 4.48*  CALCIUM 9.0 8.4*  PHOS  --  4.0   CBC: Recent Labs  Lab 11/13/19 1743 11/14/19 0540  WBC 8.7 7.2  HGB 10.5* 8.3*  HCT 34.8* 27.3*  MCV 113.4* 112.3*  PLT 172 164    Dialysis Orders: Center: [facility]  on TTS- last 11/11/2019. EDW 43.5kg, Time: 3.75 hours, 2K/ 2.5 Ca, BFR 400, DFR 600,  Dialyzer: [place] Revacir 200, LUE AVF Heparin 1000 unit load, 500 units/hour (stop 60 min before end  of HD)  Assessment/Plan: 1. Hyperkalemia: Treated with calcium gluconate and insulin as well as hydration of 500 cc.  Likely simply associated with high potassium intake over the weekend extended.  Without dialysis.  We will plan for dialysis today 2.  ESRD:  TTS [place], last HD was 11/11/2019.  Plan for full session of dialysis today.  Prescription as above 3.  Hypertension/volume: Hypertensive. Will dialyze to 44kg slightly above EDW.  Continue home blood pressure medications 4.  Anemia: Hemoglobin 8.3. Will obtain iron studies. 5.  Metabolic bone disease: Calcium and phos at goal. Continue home ferric citrate, sensipar, and calcitriol. 6. Pneumonia: receiving cefepime per primary for possible pneumonia 7. Seizures: history of recent break through. Continue home antiepileptics and f/u w/ neuro 8. Failure to thrive: Multiple hospitalizations for weakness and nonspecific symptoms including vomiting.  May benefit from palliative care involvement soon.    I reviewed outside records for this patient as they pertain to the current hospitalization.  I personally reviewed the chest x-ray from 11/13/2019 which demonstrated small left pleural effusion and mild left  base atelectasis.

## 2019-11-14 NOTE — Progress Notes (Signed)
Patient Demographics:    Erica [name], is a 64 y.o. female, DOB - 1955-05-20, WUX:[id number]  Admit date - 11/13/2019   Admitting Physician Bernadette Hoit, DO  Outpatient Primary MD for the patient is [name], NP  LOS - 1   Chief Complaint  Patient presents with  . Weakness        Subjective:    Erica [name] today has no fevers, no emesis,  No chest pain,   -Complains of fatigue, malaise shortness of breath and cough  Assessment  & Plan :    Principal Problem:   Hyperkalemia Active Problems:   Nausea & vomiting   Dehydration   Tobacco abuse   Chronic diastolic congestive heart failure (HCC)   Stroke (HCC)   Seizures (HCC)   Diabetes mellitus type 2 in nonobese (HCC)   ESRD (end stage renal disease) (HCC)   Generalized weakness   HCAP (healthcare-associated pneumonia)  Brief summary 64 y.o. female with a history of ESRD on dialysis at [facility] (TTS), BM, CHF, HTN, seizure disorder, and remote alcohol induced pancreatitis admitted on 11/13/2019 with fatigue malaise, cough and hyperkalemia   A/p 1)Hyperkalemia--- patient received hyperkalemia cocktail including calcium gluconate, insulin--- nephrology consult for hemodialysis requested  2)ESRD--usually gets HD on TTS schedule , last HD session was 11/11/2019, plan for full HD session on 11/14/2019  3)HFpEF--patient with history of chronic diastolic dysfunction CHF--- reviewed hemodialysis sessions to address volume status  4)Possible PNA-- POA--patient received Vanco and cefepime, no significant hypoxia -Continue bronchodilators and mucolytics -Repeat chest x-ray after hemodialysis  5)DM2-recent A1c 6.2, reflecting excellent diabetic control PTA -Use Novolog/Humalog Sliding scale insulin with Accu-Cheks/Fingersticks as ordered   6)HTN--okay to continue nifedipine and Coreg  7)H/o SZ--- resume Dilantin, Topamax and  Keppra  Disposition/Need for in-Hospital Stay- patient unable to be discharged at this time due to --hyperkalemia requiring hemodialysis and presumed pneumonia requiring IV antibiotics*  Status is: Inpatient  Remains inpatient appropriate because:hyperkalemia requiring hemodialysis and presumed pneumonia requiring IV antibiotics*   Disposition: The patient is from: Home              Anticipated d/c is to: Home              Anticipated d/c date is: 2 days              Patient currently is not medically stable to d/c. Barriers: Not Clinically Stable- hyperkalemia requiring hemodialysis and presumed pneumonia requiring IV antibiotics*  Code Status : full  Family Communication:    (patient is alert, awake and coherent)   Consults  :  nephrology  DVT Prophylaxis  :   - Heparin - SCDs  Lab Results  Component Value Date   PLT 164 11/14/2019    Inpatient Medications  Scheduled Meds: . calcitRIOL  0.25 mcg Oral Q T,Th,Sa-HD  . carvedilol  6.25 mg Oral BID  . Chlorhexidine Gluconate Cloth  6 each Topical Q0600  . cinacalcet  30 mg Oral QPM  . darbepoetin (ARANESP) injection - DIALYSIS  60 mcg Intravenous Q Tue-HD  . dextromethorphan-guaiFENesin  1 tablet Oral BID  . ferric citrate  420 mg Oral TID WC  . folic acid  1 mg Oral q morning - 10a  .  heparin injection (subcutaneous)  5,000 Units Subcutaneous Q8H  . insulin aspart  0-5 Units Subcutaneous QHS  . insulin aspart  0-6 Units Subcutaneous TID WC  . levETIRAcetam  1,000 mg Oral q morning - 10a  . NIFEdipine  30 mg Oral q morning - 10a  . [START ON 11/17/2019] pantoprazole  40 mg Intravenous Q12H  . phenytoin  300 mg Oral Daily  . polyethylene glycol  17 g Oral Daily  . rOPINIRole  1 mg Oral TID  . topiramate  25 mg Oral BID  . [START ON 11/17/2019] Vitamin D (Ergocalciferol)  50,000 Units Oral Q Fri   Continuous Infusions: . ceFEPime (MAXIPIME) IV    . vancomycin     PRN Meds:.acetaminophen, albuterol, loperamide,  ondansetron, ondansetron, polyvinyl alcohol    Anti-infectives (From admission, onward)   Start     Dose/Rate Route Frequency Ordered Stop   11/14/19 2300  ceFEPIme (MAXIPIME) 1 g in sodium chloride 0.9 % 100 mL IVPB        1 g 200 mL/hr over 30 Minutes Intravenous Every 24 hours 11/14/19 0058     11/14/19 1800  vancomycin (VANCOREADY) IVPB 500 mg/100 mL        500 mg 100 mL/hr over 60 Minutes Intravenous Every T-Th-Sa (Hemodialysis) 11/14/19 1236     11/13/19 2300  vancomycin (VANCOREADY) IVPB 750 mg/150 mL        750 mg 150 mL/hr over 60 Minutes Intravenous  Once 11/13/19 2247 11/14/19 0646   11/13/19 2245  ceFEPIme (MAXIPIME) 1 g in sodium chloride 0.9 % 100 mL IVPB        1 g 200 mL/hr over 30 Minutes Intravenous  Once 11/13/19 2237 11/14/19 0140        Objective:   Vitals:   11/14/19 1530 11/14/19 1600 11/14/19 1630 11/14/19 1700  BP: (!) 198/81 (!) 201/81 (!) 200/83   Pulse: 74 74 76 76  Resp: 17 20 18    Temp:      TempSrc:      SpO2:      Weight:      Height:        Wt Readings from Last 3 Encounters:  11/13/19 44 kg  10/24/19 44.5 kg  09/29/19 46.8 kg     Intake/Output Summary (Last 24 hours) at 11/14/2019 1747 Last data filed at 11/14/2019 0646 Gross per 24 hour  Intake 900 ml  Output --  Net 900 ml     Physical Exam  Gen:- Awake Alert, chronically ill-appearing HEENT:- [place].AT, No sclera icterus Neck-Supple Neck,No JVD,.  Lungs-breath sounds with scattered rhonchi  CV- S1, S2 normal, regular  Abd-  +ve B.Sounds, Abd Soft, No tenderness,    Extremity/Skin:- No  edema, pedal pulses present  Psych-affect is appropriate, oriented x3 Neuro-generalized weakness, no new focal deficits, no tremors MSK-left upper extremity AV graft with positive bruit And thrill   Data Review:   Micro Results Recent Results (from the past 240 hour(s))  Respiratory Panel by RT PCR (Flu A&B, Covid) - Nasopharyngeal Swab     Status: None   Collection Time: 11/13/19  10:38 PM   Specimen: Nasopharyngeal Swab  Result Value Ref Range Status   SARS Coronavirus 2 by RT PCR NEGATIVE NEGATIVE Final    Comment: (NOTE) SARS-CoV-2 target nucleic acids are NOT DETECTED.  The SARS-CoV-2 RNA is generally detectable in upper respiratoy specimens during the acute phase of infection. The lowest concentration of SARS-CoV-2 viral copies this assay can detect is 131  copies/mL. A negative result does not preclude SARS-Cov-2 infection and should not be used as the sole basis for treatment or other patient management decisions. A negative result may occur with  improper specimen collection/handling, submission of specimen other than nasopharyngeal swab, presence of viral mutation(s) within the areas targeted by this assay, and inadequate number of viral copies (<131 copies/mL). A negative result must be combined with clinical observations, patient history, and epidemiological information. The expected result is Negative.  Fact Sheet for Patients:  PinkCheek.be  Fact Sheet for Healthcare Providers:  GravelBags.it  This test is no t yet approved or cleared by the [place] FDA and  has been authorized for detection and/or diagnosis of SARS-CoV-2 by FDA under an Emergency Use Authorization (EUA). This EUA will remain  in effect (meaning this test can be used) for the duration of the COVID-19 declaration under Section 564(b)(1) of the Act, 21 U.S.C. section 360bbb-3(b)(1), unless the authorization is terminated or revoked sooner.     Influenza A by PCR NEGATIVE NEGATIVE Final   Influenza B by PCR NEGATIVE NEGATIVE Final    Comment: (NOTE) The Xpert Xpress SARS-CoV-2/FLU/RSV assay is intended as an aid in  the diagnosis of influenza from Nasopharyngeal swab specimens and  should not be used as a sole basis for treatment. Nasal washings and  aspirates are unacceptable for Xpert Xpress SARS-CoV-2/FLU/RSV   testing.  Fact Sheet for Patients: PinkCheek.be  Fact Sheet for Healthcare Providers: GravelBags.it  This test is not yet approved or cleared by the [place] FDA and  has been authorized for detection and/or diagnosis of SARS-CoV-2 by  FDA under an Emergency Use Authorization (EUA). This EUA will remain  in effect (meaning this test can be used) for the duration of the  Covid-19 declaration under Section 564(b)(1) of the Act, 21  U.S.C. section 360bbb-3(b)(1), unless the authorization is  terminated or revoked. Performed at [facility], [address], [place], [place] [address]   Culture, blood (Routine X 2) w Reflex to ID Panel     Status: None (Preliminary result)   Collection Time: 11/14/19  5:40 AM   Specimen: BLOOD  Result Value Ref Range Status   Specimen Description BLOOD SITE NOT SPECIFIED  Final   Special Requests   Final    BOTTLES DRAWN AEROBIC AND ANAEROBIC Blood Culture adequate volume Performed at [facility], [address], [place], [place] [address]    Culture PENDING  Incomplete   Report Status PENDING  Incomplete    Radiology Reports DG Chest 2 View  Result Date: 10/24/2019 CLINICAL DATA:  Shortness of breath and nausea. EXAM: CHEST - 2 VIEW COMPARISON:  09/27/2019 FINDINGS: Stable chronic cardiomegaly. Unchanged mediastinal contours. No pulmonary edema or acute airspace disease. No pleural effusion or pneumothorax. There are stable chronic compression deformities in the lower thoracic and upper lumbar spine. No acute osseous abnormalities are seen. IMPRESSION: Stable chronic cardiomegaly without acute abnormality. Electronically Signed   By: Keith Rake M.D.   On: 10/24/2019 17:21   DG Chest Port 1 View  Result Date: 11/13/2019 CLINICAL DATA:  Weakness and diarrhea x2 days. EXAM: PORTABLE CHEST 1 VIEW COMPARISON:  October 24, 2019 FINDINGS: Mild diffuse chronic appearing increased lung  markings are seen. Mild atelectasis and/or infiltrate is noted within the left lung base. There is a small left pleural effusion. No pneumothorax is identified. The cardiac silhouette is mildly enlarged and unchanged in size. Radiopaque fixation pins are seen within the right humeral head. IMPRESSION:  1. Mild left basilar atelectasis and/or infiltrate. 2. Small left pleural effusion. Electronically Signed   By: Virgina Norfolk M.D.   On: 11/13/2019 22:21     CBC Recent Labs  Lab 11/13/19 1743 11/14/19 0540  WBC 8.7 7.2  HGB 10.5* 8.3*  HCT 34.8* 27.3*  PLT 172 164  MCV 113.4* 112.3*  MCH 34.2* 34.2*  MCHC 30.2 30.4  RDW 11.9 11.9    Chemistries  Recent Labs  Lab 11/13/19 1743 11/14/19 0540  NA 134* 134*  K 6.5* 6.2*  CL 96* 98  CO2 27 25  GLUCOSE 133* 94  BUN 36* 40*  CREATININE 4.18* 4.48*  CALCIUM 9.0 8.4*  MG  --  2.6*  AST  --  41  ALT  --  48*  ALKPHOS  --  198*  BILITOT  --  1.3*   ------------------------------------------------------------------------------------------------------------------ No results for input(s): CHOL, HDL, LDLCALC, TRIG, CHOLHDL, LDLDIRECT in the last 72 hours.  Lab Results  Component Value Date   HGBA1C 6.2 (H) 09/28/2019   ------------------------------------------------------------------------------------------------------------------ No results for input(s): TSH, T4TOTAL, T3FREE, THYROIDAB in the last 72 hours.  Invalid input(s): FREET3 ------------------------------------------------------------------------------------------------------------------ Recent Labs    11/14/19 0953  FERRITIN 801*  TIBC 271  IRON 47    Coagulation profile Recent Labs  Lab 11/14/19 0540  INR 1.2    No results for input(s): DDIMER in the last 72 hours.  Cardiac Enzymes No results for input(s): CKMB, TROPONINI, MYOGLOBIN in the last 168 hours.  Invalid input(s):  CK ------------------------------------------------------------------------------------------------------------------ No results found for: BNP   Roxan Hockey M.D on 11/14/2019 at 5:47 PM  Go to [email or web address] - for contact info  [facility] - Office  [phone]

## 2019-11-14 NOTE — ED Notes (Signed)
Pt incont of stool, pt cleaned.

## 2019-11-15 ENCOUNTER — Inpatient Hospital Stay (HOSPITAL_COMMUNITY): Payer: Medicare Other

## 2019-11-15 DIAGNOSIS — R0600 Dyspnea, unspecified: Secondary | ICD-10-CM

## 2019-11-15 DIAGNOSIS — R0689 Other abnormalities of breathing: Secondary | ICD-10-CM

## 2019-11-15 LAB — GLUCOSE, CAPILLARY
Glucose-Capillary: 103 mg/dL — ABNORMAL HIGH (ref 70–99)
Glucose-Capillary: 99 mg/dL (ref 70–99)
Glucose-Capillary: 147 mg/dL — ABNORMAL HIGH (ref 70–99)
Glucose-Capillary: 114 mg/dL — ABNORMAL HIGH (ref 70–99)

## 2019-11-15 LAB — RENAL FUNCTION PANEL
Albumin: 3.4 g/dL — ABNORMAL LOW (ref 3.5–5.0)
Anion gap: 12 (ref 5–15)
BUN: 19 mg/dL (ref 8–23)
CO2: 26 mmol/L (ref 22–32)
Calcium: 8.2 mg/dL — ABNORMAL LOW (ref 8.9–10.3)
Chloride: 95 mmol/L — ABNORMAL LOW (ref 98–111)
Creatinine, Ser: 2.83 mg/dL — ABNORMAL HIGH (ref 0.44–1.00)
GFR calc Af Amer: 19 mL/min — ABNORMAL LOW (ref >60)
GFR calc non Af Amer: 17 mL/min — ABNORMAL LOW (ref >60)
Glucose, Bld: 108 mg/dL — ABNORMAL HIGH (ref 70–99)
Phosphorus: 3.2 mg/dL (ref 2.5–4.6)
Potassium: 4.8 mmol/L (ref 3.5–5.1)
Sodium: 133 mmol/L — ABNORMAL LOW (ref 135–145)

## 2019-11-15 MED ORDER — LOSARTAN POTASSIUM 50 MG PO TABS
25.0000 mg | ORAL_TABLET | Freq: Every day | ORAL | Status: DC
  Administered 2019-11-15 – 2019-11-19 (×5): 25 mg via ORAL
  Filled 2019-11-15 (×5): qty 1

## 2019-11-15 NOTE — Progress Notes (Addendum)
Initial Nutrition Assessment  DOCUMENTATION CODES:      INTERVENTION:  Nepro Shake po BID daily, each supplement provides 425 kcal and 19 grams protein  Limit high potassium / high sodium foods  Fluid goal 1200 ml daily  NUTRITION DIAGNOSIS:   Increased nutrient needs related to chronic illness (ESRD-HD) as evidenced by estimated needs.   GOAL:  Patient will meet greater than or equal to 90% of their needs    MONITOR:  Supplement acceptance, PO intake, Weight trends, Skin, I & O's, Labs    REASON FOR ASSESSMENT:   Malnutrition Screening Tool    ASSESSMENT: Patient is a 64 yo female with history of DM-2, chronic diarrhea, GERD, Vitamin D and Vitamin B-12 deficiency, CHF, HTN and ESRD- HD. Dialysis yesterday Net UF 2.4 liters. Patient presents with weakness, hyperkalemia, presumed pneumonia.  Patient appetite is poor 25% of breakfast this morning. Patient says the smell of foods bothers her and she is also complaining of vision difficulty. Able to feed herself. She has eaten very little today. She is starting on a Glucerna Shake and says she likes these. We talked about her trailing the Nepro which would provide significantly more nutritional value and are tailored for patients on HD.  Medications reviewed and include: Rocaltrol, Ferric citrate  Intake/Output Summary (Last 24 hours) at 11/15/2019 1359 Last data filed at 11/15/2019 0800 Gross per 24 hour  Intake 120 ml  Output 2401 ml  Net -2281 ml   Weight: 44-47 kg the past 6 months. EDW:   Labs: BMP Latest Ref Rng & Units 11/15/2019 11/14/2019 11/13/2019  Glucose 70 - 99 mg/dL 108(H) 94 133(H)  BUN 8 - 23 mg/dL 19 40(H) 36(H)  Creatinine 0.44 - 1.00 mg/dL 2.83(H) 4.48(H) 4.18(H)  Sodium 135 - 145 mmol/L 133(L) 134(L) 134(L)  Potassium 3.5 - 5.1 mmol/L 4.8 6.2(H) 6.5(HH)  Chloride 98 - 111 mmol/L 95(L) 98 96(L)  CO2 22 - 32 mmol/L 26 25 27   Calcium 8.9 - 10.3 mg/dL 8.2(L) 8.4(L) 9.0     NUTRITION - FOCUSED PHYSICAL  EXAM:  Unable to complete Nutrition-Focused physical exam at this time.     Diet Order:   Diet Order            Diet renal with fluid restriction Fluid restriction: 1200 mL Fluid; Room service appropriate? Yes; Fluid consistency: Thin  Diet effective now                EDUCATION NEEDS:  No education needs have been identified at this time   Skin:  Skin Assessment: Reviewed RN Assessment  Last BM:  9/28  Height:   Ht Readings from Last 1 Encounters:  11/13/19 5' (1.524 m)    Weight:   Wt Readings from Last 1 Encounters:  11/13/19 44 kg    Ideal Body Weight:   45 kg  BMI:  Body mass index is 18.94 kg/m.  Estimated Nutritional Needs:   Kcal:  1452-1540  Protein:  57-62 gr  Fluid:  1000 ml plus urine OP  Colman Cater MS,RD,CSG,LDN Pager: Shea Evans

## 2019-11-15 NOTE — Progress Notes (Signed)
[facility]  PROGRESS NOTE  Erica [name] MHD:[id number] DOB: 1955-07-31 DOA: 11/13/2019 PCP: [name], NP   Brief HPI:   64 year old female with history of ESRD on hemodialysis TTS, congestive heart failure, hypertension, seizure disorder, remote alcohol induced pancreatitis was admitted on 11/13/2019 due to fatigue, malaise, cough and hyperkalemia.    Subjective   Patient seen and examined, she does not have IV access.  Complains of nausea this morning.   Assessment/Plan:     1. Hyperkalemia-resolved, patient presents with hyperkalemia, received calcium gluconate, insulin.  Underwent hemodialysis yesterday.  This morning potassium is 4.8. 2. Recurrent nausea-patient has had history of recurrent nausea, she does not have IV access at this time.  Will give Zofran 4 mg p.o. every 6 hours as needed.  Will try to obtain IV access.  Unclear etiology for nausea.  Likely antibiotic induced.  Will discontinue IV antibiotics as patient has very low probability for pneumonia. 3. ?  Pneumonia-patient was empirically started on vancomycin and cefepime for?  Pneumonia.  Chest x-ray this morning only shows mild interstitial edema.  She continues to be afebrile, procalcitonin 0.38, WBC 7.2, currently O2 sats 94% room air.  Chest x-ray done this morning only shows mild interstitial edema.  Will discontinue IV antibiotics as I doubt she has pneumonia. 4. HFpEF-patient has history of contrast leak CHF, chest x-ray shows mild interstitial edema.  Hemodialysis as per nephrology. 5. Diabetes mellitus type 2-continue sliding scale insulin with NovoLog. 6. Hypertension-continue nifedipine, Coreg. 7. History of seizure disorder-continue Dilantin, Topamax, Keppra.   COVID-19 Labs  Recent Labs    11/14/19 0953  FERRITIN 801*    Lab Results  Component Value Date   SARSCOV2NAA NEGATIVE 11/13/2019   SARSCOV2NAA NEGATIVE 10/25/2019   [place] NEGATIVE 09/27/2019   [place]  NEGATIVE 04/20/2019     Scheduled medications:   . calcitRIOL  0.25 mcg Oral Q T,Th,Sa-HD  . carvedilol  6.25 mg Oral BID  . Chlorhexidine Gluconate Cloth  6 each Topical Q0600  . cinacalcet  30 mg Oral QPM  . darbepoetin (ARANESP) injection - DIALYSIS  60 mcg Intravenous Q Tue-HD  . dextromethorphan-guaiFENesin  1 tablet Oral BID  . feeding supplement (NEPRO CARB STEADY)  237 mL Oral BID BM  . ferric citrate  420 mg Oral TID WC  . folic acid  1 mg Oral q morning - 10a  . heparin injection (subcutaneous)  5,000 Units Subcutaneous Q8H  . influenza vac split quadrivalent PF  0.5 mL Intramuscular Tomorrow-1000  . insulin aspart  0-5 Units Subcutaneous QHS  . insulin aspart  0-6 Units Subcutaneous TID WC  . levETIRAcetam  1,000 mg Oral q morning - 10a  . losartan  25 mg Oral Daily  . NIFEdipine  30 mg Oral q morning - 10a  . [START ON 11/17/2019] pantoprazole  40 mg Intravenous Q12H  . phenytoin  300 mg Oral Daily  . pneumococcal 23 valent vaccine  0.5 mL Intramuscular Tomorrow-1000  . polyethylene glycol  17 g Oral Daily  . rOPINIRole  1 mg Oral TID  . topiramate  25 mg Oral BID  . [START ON 11/17/2019] Vitamin D (Ergocalciferol)  50,000 Units Oral Q Fri         CBG: Recent Labs  Lab 11/14/19 1141 11/14/19 1705 11/14/19 2140 11/15/19 0730 11/15/19 1125  GLUCAP 133* 106* 145* 103* 99    SpO2: 94 % O2 Flow Rate (L/min): 2 L/min    CBC: Recent Labs  Lab 11/13/19  1743 11/14/19 0540  WBC 8.7 7.2  HGB 10.5* 8.3*  HCT 34.8* 27.3*  MCV 113.4* 112.3*  PLT 172 935    Basic Metabolic Panel: Recent Labs  Lab 11/13/19 1743 11/14/19 0540 11/15/19 0827  NA 134* 134* 133*  K 6.5* 6.2* 4.8  CL 96* 98 95*  CO2 27 25 26   GLUCOSE 133* 94 108*  BUN 36* 40* 19  CREATININE 4.18* 4.48* 2.83*  CALCIUM 9.0 8.4* 8.2*  MG  --  2.6*  --   PHOS  --  4.0 3.2     Liver Function Tests: Recent Labs  Lab 11/14/19 0540 11/15/19 0827  AST 41  --   ALT 48*  --   ALKPHOS  198*  --   BILITOT 1.3*  --   PROT 6.7  --   ALBUMIN 3.3* 3.4*     Antibiotics: Anti-infectives (From admission, onward)   Start     Dose/Rate Route Frequency Ordered Stop   11/14/19 2300  ceFEPIme (MAXIPIME) 1 g in sodium chloride 0.9 % 100 mL IVPB  Status:  Discontinued        1 g 200 mL/hr over 30 Minutes Intravenous Every 24 hours 11/14/19 0058 11/15/19 0859   11/14/19 1800  vancomycin (VANCOREADY) IVPB 500 mg/100 mL  Status:  Discontinued        500 mg 100 mL/hr over 60 Minutes Intravenous Every T-Th-Sa (Hemodialysis) 11/14/19 1236 11/15/19 0859   11/13/19 2300  vancomycin (VANCOREADY) IVPB 750 mg/150 mL        750 mg 150 mL/hr over 60 Minutes Intravenous  Once 11/13/19 2247 11/14/19 0646   11/13/19 2245  ceFEPIme (MAXIPIME) 1 g in sodium chloride 0.9 % 100 mL IVPB        1 g 200 mL/hr over 30 Minutes Intravenous  Once 11/13/19 2237 11/14/19 0140       DVT prophylaxis: Heparin  Code Status: Full code  Family Communication: No family at bedside    Status is: Inpatient  Dispo: The patient is from: Home              Anticipated d/c is to: Home              Anticipated d/c date is: 11/16/2019              Patient currently not medically stable for discharge  Barrier to discharge-patient having nausea, ongoing hemodialysis, IV antibiotics for pneumonia       Consultants:  Nephrology  Procedures:     Objective   Vitals:   11/14/19 2026 11/15/19 0109 11/15/19 0552 11/15/19 1054  BP: (!) 183/78 (!) 179/75 (!) 176/77 (!) 159/101  Pulse: 81 82 75 78  Resp: 17 20 20 16   Temp: 99 F (37.2 C) 99.2 F (37.3 C) 98.4 F (36.9 C)   TempSrc: Oral     SpO2: 100% 95% 98% 94%  Weight:      Height:        Intake/Output Summary (Last 24 hours) at 11/15/2019 1137 Last data filed at 11/15/2019 0500 Gross per 24 hour  Intake --  Output 2401 ml  Net -2401 ml    09/27 1901 - 09/29 0700 In: 900  Out: 2401   Filed Weights   11/13/19 1620  Weight: 44 kg     Physical Examination:    General: Appears in no acute distress   Cardiovascular: S1-S2, regular, no murmur auscultated  Respiratory: Clear to auscultation bilaterally, no wheezing or crackles auscultated  Abdomen: Abdomen is soft, nontender, no organomegaly  Extremities: No edema in the lower extremities  Neurologic: Alert, oriented x3, no focal deficit noted    Data Reviewed:   Recent Results (from the past 240 hour(s))  Respiratory Panel by RT PCR (Flu A&B, Covid) - Nasopharyngeal Swab     Status: None   Collection Time: 11/13/19 10:38 PM   Specimen: Nasopharyngeal Swab  Result Value Ref Range Status   SARS Coronavirus 2 by RT PCR NEGATIVE NEGATIVE Final    Comment: (NOTE) SARS-CoV-2 target nucleic acids are NOT DETECTED.  The SARS-CoV-2 RNA is generally detectable in upper respiratoy specimens during the acute phase of infection. The lowest concentration of SARS-CoV-2 viral copies this assay can detect is 131 copies/mL. A negative result does not preclude SARS-Cov-2 infection and should not be used as the sole basis for treatment or other patient management decisions. A negative result may occur with  improper specimen collection/handling, submission of specimen other than nasopharyngeal swab, presence of viral mutation(s) within the areas targeted by this assay, and inadequate number of viral copies (<131 copies/mL). A negative result must be combined with clinical observations, patient history, and epidemiological information. The expected result is Negative.  Fact Sheet for Patients:  PinkCheek.be  Fact Sheet for Healthcare Providers:  GravelBags.it  This test is no t yet approved or cleared by the [place] FDA and  has been authorized for detection and/or diagnosis of SARS-CoV-2 by FDA under an Emergency Use Authorization (EUA). This EUA will remain  in effect (meaning this test can be used)  for the duration of the COVID-19 declaration under Section 564(b)(1) of the Act, 21 U.S.C. section 360bbb-3(b)(1), unless the authorization is terminated or revoked sooner.     Influenza A by PCR NEGATIVE NEGATIVE Final   Influenza B by PCR NEGATIVE NEGATIVE Final    Comment: (NOTE) The Xpert Xpress SARS-CoV-2/FLU/RSV assay is intended as an aid in  the diagnosis of influenza from Nasopharyngeal swab specimens and  should not be used as a sole basis for treatment. Nasal washings and  aspirates are unacceptable for Xpert Xpress SARS-CoV-2/FLU/RSV  testing.  Fact Sheet for Patients: PinkCheek.be  Fact Sheet for Healthcare Providers: GravelBags.it  This test is not yet approved or cleared by the [place] FDA and  has been authorized for detection and/or diagnosis of SARS-CoV-2 by  FDA under an Emergency Use Authorization (EUA). This EUA will remain  in effect (meaning this test can be used) for the duration of the  Covid-19 declaration under Section 564(b)(1) of the Act, 21  U.S.C. section 360bbb-3(b)(1), unless the authorization is  terminated or revoked. Performed at [facility], [address] [place], [place] [address]   Culture, blood (Routine X 2) w Reflex to ID Panel     Status: None (Preliminary result)   Collection Time: 11/14/19  3:05 AM   Specimen: BLOOD  Result Value Ref Range Status   Specimen Description BLOOD RIGHT ANTECUBITAL  Final   Special Requests   Final    BOTTLES DRAWN AEROBIC AND ANAEROBIC Blood Culture adequate volume   Culture   Final    NO GROWTH 1 DAY Performed at [facility], [address], [place], [place] [address]    Report Status PENDING  Incomplete  Culture, blood (Routine X 2) w Reflex to ID Panel     Status: None (Preliminary result)   Collection Time: 11/14/19  5:40 AM   Specimen: BLOOD  Result Value Ref Range Status  Specimen Description BLOOD SITE NOT SPECIFIED  Final    Special Requests   Final    BOTTLES DRAWN AEROBIC AND ANAEROBIC Blood Culture adequate volume   Culture   Final    NO GROWTH 1 DAY Performed at [facility], [address] [place], [place] [address]    Report Status PENDING  Incomplete       Studies:  DG Chest 2 View  Result Date: 11/15/2019 CLINICAL DATA:  Shortness of breath EXAM: CHEST - 2 VIEW COMPARISON:  11/13/2019 FINDINGS: Cardiac shadow is enlarged but stable. Lungs are well aerated bilaterally. Improved aeration in the left base is noted. Small effusions are seen bilaterally. Mild interstitial edema is noted as well. No bony abnormality is seen. IMPRESSION: Mild interstitial edema. Electronically Signed   By: Inez Catalina M.D.   On: 11/15/2019 10:37   DG Chest Port 1 View  Result Date: 11/13/2019 CLINICAL DATA:  Weakness and diarrhea x2 days. EXAM: PORTABLE CHEST 1 VIEW COMPARISON:  October 24, 2019 FINDINGS: Mild diffuse chronic appearing increased lung markings are seen. Mild atelectasis and/or infiltrate is noted within the left lung base. There is a small left pleural effusion. No pneumothorax is identified. The cardiac silhouette is mildly enlarged and unchanged in size. Radiopaque fixation pins are seen within the right humeral head. IMPRESSION: 1. Mild left basilar atelectasis and/or infiltrate. 2. Small left pleural effusion. Electronically Signed   By: Virgina Norfolk M.D.   On: 11/13/2019 22:21       Oswald Hillock   [facility] If 7PM-7AM, please contact night-coverage at [email or web address], Office  [phone]   11/15/2019, 11:37 AM  LOS: 2 days

## 2019-11-15 NOTE — Progress Notes (Signed)
Pt's IV site occluded, removed IV with site WNL. 2 nurses attempted multiple sticks without access. Mid-level paged and instructed nurse to hold off on IV tonight and re-evaluate in the AM. Will continue to monitor.

## 2019-11-15 NOTE — Progress Notes (Signed)
Pt has complained of nausea after each meal today, although takes her oral supplement shake and vanilla wafers without difficulty. Denies any c/o pain. Has had 3 loose stools this shift.

## 2019-11-15 NOTE — Progress Notes (Signed)
IV attempted X2 without success, primary RN notified,

## 2019-11-15 NOTE — Progress Notes (Signed)
Nephrology Follow-Up Consult note   Assessment/Recommendations: Erica [name] is a/an 64 y.o. female with a past medical history significant for ESRD on dialysis at [facility] (TTS), BM, CHF, HTN, seizure disorder, and remote alcohol induced pancreatitis, who presented to the emergency department because of ongoing weakness.   Dialysis Orders: [facility]: [facility]  on [facility]- last 11/11/2019. EDW 43.5kg, Time: 3.75 hours, 2K/ 2.5 Ca, BFR 400, DFR 600,  Dialyzer: [place] Revacir 200, LUE AVF Heparin 1000 unit load, 500 units/hour (stop 60 min before end of HD)  Assessment/Plan: 1. Hyperkalemia: Now s/p HD. Labs this morning to ensure improvement. 2.  ESRD:  TTS [place]. Tolerated HD yesterday with no issues. Continue per schedule. 3.  Hypertension/volume: Hypertensive and appears euvolemic but may need to challenge EDW slightly. Will continue coreg and procardia but add losartan 25mg  daily. Go back down to EDW of 43.5kg tomorrow 4.  Anemia: Hemoglobin 8.3. Ferritin 801 and iron sat 17. Start aranesp 92mcg weekly. 5.  Metabolic bone disease: Calcium and phos at goal. Continue home ferric citrate, sensipar, and calcitriol. 6. Pneumonia: was receiving cefepime and vanc lost IV access abx on hold. Mgmt per primary 7. Seizures: history of recent break through. Continue home antiepileptics and f/u w/ neuro 8. Failure to thrive: Multiple hospitalizations for weakness and nonspecific symptoms including vomiting.  May benefit from palliative care involvement soon.    Recommendations conveyed to primary service.    [facility] 11/15/2019 9:11 AM  ___________________________________________________________  CC: ESRD  Interval History/Subjective: Patient feels well today without complaints. States HD went well yesterday. 2.4L UF. HTN overnight. Today [birth year] her birthday.    Medications:  Current Facility-Administered Medications  Medication Dose Route Frequency  Provider Last Rate Last Admin  . 0.9 %  sodium chloride infusion   Intravenous PRN Emokpae, Courage, MD      . acetaminophen (TYLENOL) tablet 650 mg  650 mg Oral Q4H PRN Adefeso, Oladapo, DO   650 mg at 11/15/19 0543  . albuterol (VENTOLIN HFA) 108 (90 Base) MCG/ACT inhaler 2 puff  2 puff Inhalation Q4H PRN Adefeso, Oladapo, DO      . calcitRIOL (ROCALTROL) capsule 0.25 mcg  0.25 mcg Oral Q T,Th,Sa-HD Adefeso, Oladapo, DO   0.25 mcg at 11/14/19 1212  . carvedilol (COREG) tablet 6.25 mg  6.25 mg Oral BID Adefeso, Oladapo, DO   6.25 mg at 11/14/19 2228  . Chlorhexidine Gluconate Cloth 2 % PADS 6 each  6 each Topical Q0600 Reesa Chew, MD   6 each at 11/15/19 0543  . cinacalcet (SENSIPAR) tablet 30 mg  30 mg Oral QPM Adefeso, Oladapo, DO   30 mg at 11/14/19 1857  . Darbepoetin Alfa (ARANESP) injection 60 mcg  60 mcg Intravenous Q Tue-HD Reesa Chew, MD   60 mcg at 11/14/19 1748  . dextromethorphan-guaiFENesin (MUCINEX DM) 30-600 MG per 12 hr tablet 1 tablet  1 tablet Oral BID Adefeso, Oladapo, DO   1 tablet at 11/14/19 2228  . feeding supplement (NEPRO CARB STEADY) liquid 237 mL  237 mL Oral BID BM Emokpae, Courage, MD      . ferric citrate (AURYXIA) tablet 420 mg  420 mg Oral TID WC Adefeso, Oladapo, DO   420 mg at 11/14/19 1856  . folic acid (FOLVITE) tablet 1 mg  1 mg Oral q morning - 10a Adefeso, Oladapo, DO   1 mg at 11/14/19 1008  . heparin injection 5,000 Units  5,000 Units Subcutaneous Q8H Adefeso,  Oladapo, DO   5,000 Units at 11/15/19 0543  . influenza vac split quadrivalent PF (FLUARIX) injection 0.5 mL  0.5 mL Intramuscular Tomorrow-1000 Emokpae, Courage, MD      . insulin aspart (novoLOG) injection 0-5 Units  0-5 Units Subcutaneous QHS Adefeso, Oladapo, DO      . insulin aspart (novoLOG) injection 0-6 Units  0-6 Units Subcutaneous TID WC Adefeso, Oladapo, DO      . levETIRAcetam (KEPPRA) tablet 1,000 mg  1,000 mg Oral q morning - 10a Adefeso, Oladapo, DO   1,000 mg at 11/14/19  1009  . loperamide (IMODIUM) capsule 4 mg  4 mg Oral Daily PRN Adefeso, Oladapo, DO      . losartan (COZAAR) tablet 25 mg  25 mg Oral Daily Reesa Chew, MD      . NIFEdipine (PROCARDIA-XL/NIFEDICAL-XL) 24 hr tablet 30 mg  30 mg Oral q morning - 10a Adefeso, Oladapo, DO   30 mg at 11/14/19 1009  . ondansetron (ZOFRAN) tablet 4 mg  4 mg Oral Q6H PRN Adefeso, Oladapo, DO   4 mg at 11/15/19 0857  . ondansetron (ZOFRAN-ODT) disintegrating tablet 4 mg  4 mg Oral Q8H PRN Adefeso, Oladapo, DO      . [START ON 11/17/2019] pantoprazole (PROTONIX) injection 40 mg  40 mg Intravenous Q12H Adefeso, Oladapo, DO      . phenytoin (DILANTIN) chewable tablet 300 mg  300 mg Oral Daily Adefeso, Oladapo, DO   300 mg at 11/14/19 1857  . pneumococcal 23 valent vaccine (PNEUMOVAX-23) injection 0.5 mL  0.5 mL Intramuscular Tomorrow-1000 Emokpae, Courage, MD      . polyethylene glycol (MIRALAX / GLYCOLAX) packet 17 g  17 g Oral Daily Adefeso, Oladapo, DO   17 g at 11/14/19 1005  . polyvinyl alcohol (LIQUIFILM TEARS) 1.4 % ophthalmic solution 1 drop  1 drop Both Eyes PRN Emokpae, Courage, MD      . rOPINIRole (REQUIP) tablet 1 mg  1 mg Oral TID Adefeso, Oladapo, DO   1 mg at 11/14/19 2228  . topiramate (TOPAMAX) tablet 25 mg  25 mg Oral BID Adefeso, Oladapo, DO   25 mg at 11/14/19 2228  . [START ON 11/17/2019] Vitamin D (Ergocalciferol) (DRISDOL) capsule 50,000 Units  50,000 Units Oral Q Fri Adefeso, Oladapo, DO          Review of Systems: 10 systems reviewed and negative except per interval history/subjective  Physical Exam: Vitals:   11/15/19 0109 11/15/19 0552  BP: (!) 179/75 (!) 176/77  Pulse: 82 75  Resp: 20 20  Temp: 99.2 F (37.3 C) 98.4 F (36.9 C)  SpO2: 95% 98%   No intake/output data recorded.  Intake/Output Summary (Last 24 hours) at 11/15/2019 0911 Last data filed at 11/15/2019 0500 Gross per 24 hour  Intake --  Output 2401 ml  Net -2401 ml   GEN: Thin, frail, chronically  ill-appearing ENT: no nasal discharge, mmm EYES: no scleral icterus, eomi CV: normal rate PULM: no iwob, bilateral chest rise ABD: NABS, non-distended SKIN: no rashes or jaundice EXT: no edema, warm and well perfused Dialysis Access: LUE AVG + bruit   Test Results I personally reviewed new and old clinical labs and radiology tests Lab Results  Component Value Date   NA 133 (L) 11/15/2019   K 4.8 11/15/2019   CL 95 (L) 11/15/2019   CO2 26 11/15/2019   BUN 19 11/15/2019   CREATININE 2.83 (H) 11/15/2019   CALCIUM 8.2 (L) 11/15/2019   ALBUMIN 3.4 (L)  11/15/2019   PHOS 3.2 11/15/2019

## 2019-11-16 DIAGNOSIS — Z7189 Other specified counseling: Secondary | ICD-10-CM

## 2019-11-16 DIAGNOSIS — Z515 Encounter for palliative care: Secondary | ICD-10-CM

## 2019-11-16 LAB — GLUCOSE, CAPILLARY
Glucose-Capillary: 90 mg/dL (ref 70–99)
Glucose-Capillary: 77 mg/dL (ref 70–99)
Glucose-Capillary: 110 mg/dL — ABNORMAL HIGH (ref 70–99)
Glucose-Capillary: 234 mg/dL — ABNORMAL HIGH (ref 70–99)

## 2019-11-16 LAB — RENAL FUNCTION PANEL
Albumin: 3.6 g/dL (ref 3.5–5.0)
Anion gap: 12 (ref 5–15)
BUN: 25 mg/dL — ABNORMAL HIGH (ref 8–23)
CO2: 25 mmol/L (ref 22–32)
Calcium: 8.3 mg/dL — ABNORMAL LOW (ref 8.9–10.3)
Chloride: 95 mmol/L — ABNORMAL LOW (ref 98–111)
Creatinine, Ser: 3.68 mg/dL — ABNORMAL HIGH (ref 0.44–1.00)
GFR calc Af Amer: 14 mL/min — ABNORMAL LOW (ref >60)
GFR calc non Af Amer: 12 mL/min — ABNORMAL LOW (ref >60)
Glucose, Bld: 89 mg/dL (ref 70–99)
Phosphorus: 3.5 mg/dL (ref 2.5–4.6)
Potassium: 5.3 mmol/L — ABNORMAL HIGH (ref 3.5–5.1)
Sodium: 132 mmol/L — ABNORMAL LOW (ref 135–145)

## 2019-11-16 LAB — BASIC METABOLIC PANEL
Anion gap: 13 (ref 5–15)
BUN: 25 mg/dL — ABNORMAL HIGH (ref 8–23)
CO2: 25 mmol/L (ref 22–32)
Calcium: 8.2 mg/dL — ABNORMAL LOW (ref 8.9–10.3)
Chloride: 94 mmol/L — ABNORMAL LOW (ref 98–111)
Creatinine, Ser: 3.74 mg/dL — ABNORMAL HIGH (ref 0.44–1.00)
GFR calc Af Amer: 14 mL/min — ABNORMAL LOW (ref >60)
GFR calc non Af Amer: 12 mL/min — ABNORMAL LOW (ref >60)
Glucose, Bld: 88 mg/dL (ref 70–99)
Potassium: 5.4 mmol/L — ABNORMAL HIGH (ref 3.5–5.1)
Sodium: 132 mmol/L — ABNORMAL LOW (ref 135–145)

## 2019-11-16 LAB — LIPASE, BLOOD: Lipase: 18 U/L (ref 11–51)

## 2019-11-16 LAB — CBC
HCT: 29.6 % — ABNORMAL LOW (ref 36.0–46.0)
Hemoglobin: 9.4 g/dL — ABNORMAL LOW (ref 12.0–15.0)
MCH: 34.3 pg — ABNORMAL HIGH (ref 26.0–34.0)
MCHC: 31.8 g/dL (ref 30.0–36.0)
MCV: 108 fL — ABNORMAL HIGH (ref 80.0–100.0)
Platelets: 152 10*3/uL (ref 150–400)
RBC: 2.74 MIL/uL — ABNORMAL LOW (ref 3.87–5.11)
RDW: 12.1 % (ref 11.5–15.5)
WBC: 5.2 10*3/uL (ref 4.0–10.5)
nRBC: 0 % (ref 0.0–0.2)

## 2019-11-16 LAB — HEPATITIS B SURFACE ANTIGEN: Hepatitis B Surface Ag: NONREACTIVE

## 2019-11-16 MED ORDER — HYDRALAZINE HCL 25 MG PO TABS
25.0000 mg | ORAL_TABLET | Freq: Three times a day (TID) | ORAL | Status: DC
  Administered 2019-11-16 – 2019-11-19 (×9): 25 mg via ORAL
  Filled 2019-11-16 (×10): qty 1

## 2019-11-16 MED ORDER — METOCLOPRAMIDE HCL 10 MG PO TABS
5.0000 mg | ORAL_TABLET | Freq: Three times a day (TID) | ORAL | Status: DC
  Administered 2019-11-16 – 2019-11-19 (×11): 5 mg via ORAL
  Filled 2019-11-16 (×12): qty 1

## 2019-11-16 MED ORDER — HEPARIN SODIUM (PORCINE) 1000 UNIT/ML DIALYSIS: 1000.0000 [IU] | INTRAMUSCULAR | Status: DC | PRN

## 2019-11-16 NOTE — Consult Note (Signed)
Consultation Note Date: 11/16/2019   Patient Name: Erica [name]  DOB: 04-20-1955  MRN: [id number]  Age / Sex: 64 y.o., female  PCP: [name], NP Referring Physician: Oswald Hillock, MD  Reason for Consultation: Establishing goals of care  HPI/Patient Profile: 64 y.o. female  with past medical history of seizures, stroke,  ESRD on HD TTS, diabetes, COPD, diastolic CHF, hypertension admitted on 11/13/2019 with generalized weakness with dehydration, hyperkalemia, nausea/vomiting, HCAP.   Clinical Assessment and Goals of Care: I met today with Erica [name] who is currently beginning dialysis and awaiting to be cleaned (BM). She denies issues with constipation at home. She shares that she has had nausea and tells me that this has been happening for past week and at another time tells me this has been happening for a month. She appears to be awake and alert and oriented somewhat to situation but poor historia. She gives me permission to call her son, Erica [name]. She lives with her son. Her daughter and another family member assist in her personal care but she can ambulate and not dependent on all ADLs but needs some assistance.   I spoke with Erica [name] who shares that his mother has not had notable weight loss or worsening functional status. She has been sleeping more. She does have constipation at times followed by diarrhea. He has noticed that she has many complaints on dialysis days and often does not want to go but family can encourage her. She has been on dialysis since 2019 he believes. He does report that she mentions especially pill feeling like they get stuck. He does also note that nausea/vomiting often triggered by intake. He also reports that some days she is fine and eats/drinks okay and other days the nausea is worse and unable to have intake.   We also discussed goals of care and Erica [name] reports that his  mother desires to be full code and they are hopeful that symptoms can improve. We discussed concerning signs of decline if she continues with complications such as sleeping all the time, weakness, weight loss with poor intake. Encouraged ongoing goals of care conversation as her health changes and evolves. He agrees for outpatient palliative to follow.   SYMPTOMS: Nausea/vomiting etiology unknown. Possible gastroparesis or ischemic bowel. Will consult SLP given concerns with swallowing. Possible stricture? Reflux?. Consider GI involvement. Agree with Reglan and PPI. May consider scheduled ondansetron if this seems helpful.   Primary Decision Maker Adult children (son Erica [name] and daughter Erica [name])    SUMMARY OF RECOMMENDATIONS   - Full code - Outpatient palliative to follow  Code Status/Advance Care Planning:  Full code   Symptom Management:   See above discussion  Palliative Prophylaxis:   Aspiration, Bowel Regimen, Delirium Protocol, Frequent Pain Assessment and Oral Care  Additional Recommendations (Limitations, Scope, Preferences):  Full Scope Treatment  Prognosis:   Unable to determine  Discharge Planning: Home with Palliative Services      Primary Diagnoses: Present on Admission: . Hyperkalemia . Nausea & vomiting .  Dehydration . Chronic diastolic congestive heart failure ([place]) . ESRD (end stage renal disease) ([place]) . Stroke ([place]) . Tobacco abuse   I have reviewed the medical record, interviewed the patient and family, and examined the patient. The following aspects are pertinent.  Past Medical History:  Diagnosis Date  . Alcohol-induced pancreatitis   . Chronic diarrhea   . Depression   . Diabetes mellitus    fasting blood sugar 110-120s  . Diastolic CHF ([place])   . DKA (diabetic ketoacidoses) ([place])   . Gastroparesis   . GERD (gastroesophageal reflux disease)   . Heart murmur   . History of kidney stones   . Hyperlipidemia   . Hypertension   .  Hypokalemia   . Muscle spasm   . Neuropathic pain   . Neuropathy    Hx: of  . Pyelonephritis   . Seizures ([place])   . Vitamin B12 deficiency   . Vitamin D deficiency    Social History   Socioeconomic History  . Marital status: Legally Separated    Spouse name: Not on file  . Number of children: Not on file  . Years of education: Not on file  . Highest education level: Not on file  Occupational History  . Not on file  Tobacco Use  . Smoking status: Never Smoker  . Smokeless tobacco: Current User    Types: Snuff  Vaping Use  . Vaping Use: Never used  Substance and Sexual Activity  . Alcohol use: Not Currently    Alcohol/week: 1.0 standard drink    Types: 1 Cans of beer per week  . Drug use: No  . Sexual activity: Not Currently  Other Topics Concern  . Not on file  Social History Narrative  . Not on file   Social Determinants of Health   Financial Resource Strain:   . Difficulty of Paying Living Expenses: Not on file  Food Insecurity:   . Worried About Charity fundraiser in the Last Year: Not on file  . Ran Out of Food in the Last Year: Not on file  Transportation Needs:   . Lack of Transportation (Medical): Not on file  . Lack of Transportation (Non-Medical): Not on file  Physical Activity:   . Days of Exercise per Week: Not on file  . Minutes of Exercise per Session: Not on file  Stress:   . Feeling of Stress : Not on file  Social Connections:   . Frequency of Communication with Friends and Family: Not on file  . Frequency of Social Gatherings with Friends and Family: Not on file  . Attends Religious Services: Not on file  . Active Member of Clubs or Organizations: Not on file  . Attends Archivist Meetings: Not on file  . Marital Status: Not on file   Family History  Problem Relation Age of Onset  . Diabetes Sister   . Chronic Renal Failure Neg Hx    Scheduled Meds: . calcitRIOL  0.25 mcg Oral Q T,Th,Sa-HD  . carvedilol  6.25 mg Oral BID  .  Chlorhexidine Gluconate Cloth  6 each Topical Q0600  . cinacalcet  30 mg Oral QPM  . darbepoetin (ARANESP) injection - DIALYSIS  60 mcg Intravenous Q Tue-HD  . dextromethorphan-guaiFENesin  1 tablet Oral BID  . feeding supplement (NEPRO CARB STEADY)  237 mL Oral BID BM  . ferric citrate  420 mg Oral TID WC  . folic acid  1 mg Oral q morning - 10a  .  heparin injection (subcutaneous)  5,000 Units Subcutaneous Q8H  . hydrALAZINE  25 mg Oral Q8H  . influenza vac split quadrivalent PF  0.5 mL Intramuscular Tomorrow-1000  . insulin aspart  0-5 Units Subcutaneous QHS  . insulin aspart  0-6 Units Subcutaneous TID WC  . levETIRAcetam  1,000 mg Oral q morning - 10a  . losartan  25 mg Oral Daily  . metoCLOPramide  5 mg Oral TID AC & HS  . NIFEdipine  30 mg Oral q morning - 10a  . [START ON 11/17/2019] pantoprazole  40 mg Intravenous Q12H  . phenytoin  300 mg Oral Daily  . pneumococcal 23 valent vaccine  0.5 mL Intramuscular Tomorrow-1000  . polyethylene glycol  17 g Oral Daily  . rOPINIRole  1 mg Oral TID  . topiramate  25 mg Oral BID  . [START ON 11/17/2019] Vitamin D (Ergocalciferol)  50,000 Units Oral Q Fri   Continuous Infusions: . sodium chloride     PRN Meds:.sodium chloride, acetaminophen, albuterol, loperamide, ondansetron, polyvinyl alcohol Allergies  Allergen Reactions  . Aspirin Palpitations    Listed on [facility] 11/12/18   Review of Systems  Constitutional: Positive for appetite change and fatigue. Negative for activity change.  Gastrointestinal: Positive for diarrhea and nausea.    Physical Exam Vitals and nursing note reviewed.  Constitutional:      Appearance: She is ill-appearing.     Comments: Thin, frail  Cardiovascular:     Rate and Rhythm: Normal rate.  Pulmonary:     Effort: Pulmonary effort is normal. No tachypnea, accessory muscle usage or respiratory distress.  Abdominal:     General: Abdomen is flat.     Palpations: Abdomen is soft.     Tenderness: There is no  abdominal tenderness.  Neurological:     Mental Status: She is alert and oriented to person, place, and time.     Comments: Forgetful, poor historian     Vital Signs: BP (!) 196/82 (BP Location: Right Arm)   Pulse 75   Temp 98 F (36.7 C) (Oral)   Resp 18   Ht 5' (1.524 m)   Wt 49.5 kg   SpO2 98%   BMI 21.31 kg/m  Pain Scale: 0-10   Pain Score: 0-No pain   SpO2: SpO2: 98 % O2 Device:SpO2: 98 % O2 Flow Rate: .O2 Flow Rate (L/min): 2 L/min  IO: Intake/output summary:   Intake/Output Summary (Last 24 hours) at 11/16/2019 1449 Last data filed at 11/15/2019 1847 Gross per 24 hour  Intake 240 ml  Output --  Net 240 ml    LBM: Last BM Date: 11/16/19 Baseline Weight: Weight: 44 kg Most recent weight: Weight: 49.5 kg     Palliative Assessment/Data:     Time In: 1500 Time Out: 1600 Time Total: 60 min Greater than 50%  of this time was spent counseling and coordinating care related to the above assessment and plan.  Signed by: Vinie Sill, NP Palliative Medicine Team Pager # [phone] (M-F 8a-5p) Team Phone # [phone] (Nights/Weekends)

## 2019-11-16 NOTE — Procedures (Signed)
     HEMODIALYSIS TREATMENT NOTE:   3.75 hour heparin-free dialysis completed via LUE loop AVG (15g/retrograde flow confirmed).  Goal met: 3.5 liters removed without interruption in UF.  All blood was returned and hemostasis was achieved in 30 minutes; prolonged bleeding from venous limb.  No changes from pre-dialysis assessment.  Rockwell Alexandria, RN

## 2019-11-16 NOTE — Progress Notes (Signed)
[facility]  PROGRESS NOTE  Erica [name] GDJ:[id number] DOB: 1955-09-22 DOA: 11/13/2019 PCP: [name], NP   Brief HPI:   64 year old female with history of ESRD on hemodialysis TTS, congestive heart failure, hypertension, seizure disorder, remote alcohol induced pancreatitis was admitted on 11/13/2019 due to fatigue, malaise, cough and hyperkalemia.    Subjective   Patient seen and examined, still has intermittent nausea.   Assessment/Plan:     1. Hyperkalemia-resolved, patient presents with hyperkalemia, received calcium gluconate, insulin.  Underwent hemodialysis yesterday.  This morning potassium is 4.8. 2. Recurrent nausea-?  Gastroparesis, patient has had history of recurrent nausea, she does not have IV access at this time.  Will give Zofran 4 mg p.o. every 6 hours as needed.  Will try to obtain IV access.  Unclear etiology for nausea.  Likely antibiotic induced.  Antibiotics were discontinued.  Will start Reglan 5 mg p.o. 4 times a day.   3. ?  Pneumonia-patient was empirically started on vancomycin and cefepime for?  Pneumonia.  Chest x-ray this morning only shows mild interstitial edema.  She continues to be afebrile, procalcitonin 0.38, WBC 7.2, currently O2 sats 94% room air.  Chest x-ray done this morning only shows mild interstitial edema.  IV antibiotics were discontinued.   4. HFpEF-patient has history of contrast leak CHF, chest x-ray shows mild interstitial edema.  Hemodialysis as per nephrology. 5. Diabetes mellitus type 2-continue sliding scale insulin with NovoLog. 6. Hypertension-continue nifedipine, Coreg.  Will add hydralazine 25 mg 3 times daily.  If blood pressure does not improve, will increase the dose of nifedipine XL to 60 mg daily. 7. History of seizure disorder-continue Dilantin, Topamax, Keppra.  Goals of care-discussed with nephrology Dr. Joylene Grapes, he called and discussed with patient's son regarding Perative care consult.  Patient son  agrees for palliative care consultation.  Will order consult as per his recommendation.   COVID-19 Labs  Recent Labs    11/14/19 0953  FERRITIN 801*    Lab Results  Component Value Date   SARSCOV2NAA NEGATIVE 11/13/2019   SARSCOV2NAA NEGATIVE 10/25/2019   [place] NEGATIVE 09/27/2019   [place] NEGATIVE 04/20/2019     Scheduled medications:   . calcitRIOL  0.25 mcg Oral Q T,Th,Sa-HD  . carvedilol  6.25 mg Oral BID  . Chlorhexidine Gluconate Cloth  6 each Topical Q0600  . cinacalcet  30 mg Oral QPM  . darbepoetin (ARANESP) injection - DIALYSIS  60 mcg Intravenous Q Tue-HD  . dextromethorphan-guaiFENesin  1 tablet Oral BID  . feeding supplement (NEPRO CARB STEADY)  237 mL Oral BID BM  . ferric citrate  420 mg Oral TID WC  . folic acid  1 mg Oral q morning - 10a  . heparin injection (subcutaneous)  5,000 Units Subcutaneous Q8H  . hydrALAZINE  25 mg Oral Q8H  . influenza vac split quadrivalent PF  0.5 mL Intramuscular Tomorrow-1000  . insulin aspart  0-5 Units Subcutaneous QHS  . insulin aspart  0-6 Units Subcutaneous TID WC  . levETIRAcetam  1,000 mg Oral q morning - 10a  . losartan  25 mg Oral Daily  . metoCLOPramide  5 mg Oral TID AC & HS  . NIFEdipine  30 mg Oral q morning - 10a  . [START ON 11/17/2019] pantoprazole  40 mg Intravenous Q12H  . phenytoin  300 mg Oral Daily  . pneumococcal 23 valent vaccine  0.5 mL Intramuscular Tomorrow-1000  . polyethylene glycol  17 g Oral Daily  . rOPINIRole  1  mg Oral TID  . topiramate  25 mg Oral BID  . [START ON 11/17/2019] Vitamin D (Ergocalciferol)  50,000 Units Oral Q Fri         CBG: Recent Labs  Lab 11/15/19 1125 11/15/19 1651 11/15/19 2108 11/16/19 0754 11/16/19 1136  GLUCAP 99 147* 114* 90 77    SpO2: 98 % O2 Flow Rate (L/min): 2 L/min    CBC: Recent Labs  Lab 11/13/19 1743 11/14/19 0540 11/16/19 0941  WBC 8.7 7.2 5.2  HGB 10.5* 8.3* 9.4*  HCT 34.8* 27.3* 29.6*  MCV 113.4* 112.3* 108.0*  PLT  172 164 620    Basic Metabolic Panel: Recent Labs  Lab 11/13/19 1743 11/14/19 0540 11/15/19 0827 11/16/19 0941  NA 134* 134* 133* 132*  132*  K 6.5* 6.2* 4.8 5.3*  5.4*  CL 96* 98 95* 95*  94*  CO2 27 25 26 25  25   GLUCOSE 133* 94 108* 89  88  BUN 36* 40* 19 25*  25*  CREATININE 4.18* 4.48* 2.83* 3.68*  3.74*  CALCIUM 9.0 8.4* 8.2* 8.3*  8.2*  MG  --  2.6*  --   --   PHOS  --  4.0 3.2 3.5     Liver Function Tests: Recent Labs  Lab 11/14/19 0540 11/15/19 0827 11/16/19 0941  AST 41  --   --   ALT 48*  --   --   ALKPHOS 198*  --   --   BILITOT 1.3*  --   --   PROT 6.7  --   --   ALBUMIN 3.3* 3.4* 3.6     Antibiotics: Anti-infectives (From admission, onward)   Start     Dose/Rate Route Frequency Ordered Stop   11/14/19 2300  ceFEPIme (MAXIPIME) 1 g in sodium chloride 0.9 % 100 mL IVPB  Status:  Discontinued        1 g 200 mL/hr over 30 Minutes Intravenous Every 24 hours 11/14/19 0058 11/15/19 0859   11/14/19 1800  vancomycin (VANCOREADY) IVPB 500 mg/100 mL  Status:  Discontinued        500 mg 100 mL/hr over 60 Minutes Intravenous Every T-Th-Sa (Hemodialysis) 11/14/19 1236 11/15/19 0859   11/13/19 2300  vancomycin (VANCOREADY) IVPB 750 mg/150 mL        750 mg 150 mL/hr over 60 Minutes Intravenous  Once 11/13/19 2247 11/14/19 0646   11/13/19 2245  ceFEPIme (MAXIPIME) 1 g in sodium chloride 0.9 % 100 mL IVPB        1 g 200 mL/hr over 30 Minutes Intravenous  Once 11/13/19 2237 11/14/19 0140       DVT prophylaxis: Heparin  Code Status: Full code  Family Communication: No family at bedside    Status is: Inpatient  Dispo: The patient is from: Home              Anticipated d/c is to: Home              Anticipated d/c date is: 11/16/2019              Patient currently not medically stable for discharge  Barrier to discharge-patient having nausea, ongoing hemodialysis, IV antibiotics for pneumonia        Consultants:  Nephrology  Procedures:     Objective   Vitals:   11/16/19 0546 11/16/19 0800 11/16/19 0844 11/16/19 1042  BP: (!) 187/81 (!) 196/89  (!) 196/82  Pulse: 75 78  75  Resp: 20   18  Temp: 98.7 F (37.1 C)  98 F (36.7 C)   TempSrc: Oral  Oral   SpO2: 96% 96%  98%  Weight: 49.5 kg     Height:        Intake/Output Summary (Last 24 hours) at 11/16/2019 1250 Last data filed at 11/15/2019 1847 Gross per 24 hour  Intake 240 ml  Output --  Net 240 ml    09/28 1901 - 09/30 0700 In: 480 [P.O.:480] Out: 1   Filed Weights   11/13/19 1620 11/16/19 0546  Weight: 44 kg 49.5 kg    Physical Examination:   General-appears in no acute distress Heart-S1-S2, regular, no murmur auscultated Lungs-clear to auscultation bilaterally, no wheezing or crackles auscultated Abdomen-soft, nontender, no organomegaly Extremities-no edema in the lower extremities Neuro-alert, oriented x3, no focal deficit noted   Data Reviewed:   Recent Results (from the past 240 hour(s))  Respiratory Panel by RT PCR (Flu A&B, Covid) - Nasopharyngeal Swab     Status: None   Collection Time: 11/13/19 10:38 PM   Specimen: Nasopharyngeal Swab  Result Value Ref Range Status   SARS Coronavirus 2 by RT PCR NEGATIVE NEGATIVE Final    Comment: (NOTE) SARS-CoV-2 target nucleic acids are NOT DETECTED.  The SARS-CoV-2 RNA is generally detectable in upper respiratoy specimens during the acute phase of infection. The lowest concentration of SARS-CoV-2 viral copies this assay can detect is 131 copies/mL. A negative result does not preclude SARS-Cov-2 infection and should not be used as the sole basis for treatment or other patient management decisions. A negative result may occur with  improper specimen collection/handling, submission of specimen other than nasopharyngeal swab, presence of viral mutation(s) within the areas targeted by this assay, and inadequate number of viral copies (<131  copies/mL). A negative result must be combined with clinical observations, patient history, and epidemiological information. The expected result is Negative.  Fact Sheet for Patients:  PinkCheek.be  Fact Sheet for Healthcare Providers:  GravelBags.it  This test is no t yet approved or cleared by the [place] FDA and  has been authorized for detection and/or diagnosis of SARS-CoV-2 by FDA under an Emergency Use Authorization (EUA). This EUA will remain  in effect (meaning this test can be used) for the duration of the COVID-19 declaration under Section 564(b)(1) of the Act, 21 U.S.C. section 360bbb-3(b)(1), unless the authorization is terminated or revoked sooner.     Influenza A by PCR NEGATIVE NEGATIVE Final   Influenza B by PCR NEGATIVE NEGATIVE Final    Comment: (NOTE) The Xpert Xpress SARS-CoV-2/FLU/RSV assay is intended as an aid in  the diagnosis of influenza from Nasopharyngeal swab specimens and  should not be used as a sole basis for treatment. Nasal washings and  aspirates are unacceptable for Xpert Xpress SARS-CoV-2/FLU/RSV  testing.  Fact Sheet for Patients: PinkCheek.be  Fact Sheet for Healthcare Providers: GravelBags.it  This test is not yet approved or cleared by the [place] FDA and  has been authorized for detection and/or diagnosis of SARS-CoV-2 by  FDA under an Emergency Use Authorization (EUA). This EUA will remain  in effect (meaning this test can be used) for the duration of the  Covid-19 declaration under Section 564(b)(1) of the Act, 21  U.S.C. section 360bbb-3(b)(1), unless the authorization is  terminated or revoked. Performed at [facility], [address] [place], [place] [address]   Culture, blood (Routine X 2) w Reflex to ID Panel     Status: None (Preliminary result)  Collection Time: 11/14/19  3:05 AM   Specimen:  BLOOD  Result Value Ref Range Status   Specimen Description BLOOD RIGHT ANTECUBITAL  Final   Special Requests   Final    BOTTLES DRAWN AEROBIC AND ANAEROBIC Blood Culture adequate volume   Culture   Final    NO GROWTH 2 DAYS Performed at [facility], [name], [place], [place] [address]    Report Status PENDING  Incomplete  Culture, blood (Routine X 2) w Reflex to ID Panel     Status: None (Preliminary result)   Collection Time: 11/14/19  5:40 AM   Specimen: BLOOD  Result Value Ref Range Status   Specimen Description BLOOD SITE NOT SPECIFIED  Final   Special Requests   Final    BOTTLES DRAWN AEROBIC AND ANAEROBIC Blood Culture adequate volume   Culture   Final    NO GROWTH 2 DAYS Performed at [facility], [address], [place], [place] [address]    Report Status PENDING  Incomplete       Studies:  DG Chest 2 View  Result Date: 11/15/2019 CLINICAL DATA:  Shortness of breath EXAM: CHEST - 2 VIEW COMPARISON:  11/13/2019 FINDINGS: Cardiac shadow is enlarged but stable. Lungs are well aerated bilaterally. Improved aeration in the left base is noted. Small effusions are seen bilaterally. Mild interstitial edema is noted as well. No bony abnormality is seen. IMPRESSION: Mild interstitial edema. Electronically Signed   By: Inez Catalina M.D.   On: 11/15/2019 10:37       [place]   [facility] If 7PM-7AM, please contact night-coverage at [email or web address], Office  [phone]   11/16/2019, 12:50 PM  LOS: 3 days

## 2019-11-16 NOTE — Progress Notes (Signed)
Pt c/o "trouble breathing".  Upon assessment, pt A&O, denies c/o chest pain. Skin warm and dry, chest CTA. SaO2 98% on room air, resp even and nonlaboared. B/P is elevated, MD [place] notified and order received for po antihypertensive. Pt updated on med addition and agreeable. Lab in to draw blood but unsuccessful.

## 2019-11-16 NOTE — Progress Notes (Signed)
Dialysis completed. Pt states she wants to try to eat once fistula site is deaccessed. Alerrt and oriented, no c/o nausea at present.

## 2019-11-16 NOTE — Progress Notes (Signed)
Dialysis remains in progress. Pt has had 3 loose, watery stools since lunchtime. Antidiarrheal medication given. Pt denies any other c/o.

## 2019-11-16 NOTE — Care Management Important Message (Signed)
Important Message  Patient Details  Name: Erica [name] MRN: [id number] Date of Birth: [birth year]   Medicare Important Message Given:  Yes     Tommy Medal 11/16/2019, 11:52 AM

## 2019-11-16 NOTE — Progress Notes (Addendum)
Nephrology Follow-Up Consult note   Assessment/Recommendations: Erica [name] is a/an 64 y.o. female with a past medical history significant for ESRD on dialysis at [facility] (TTS), BM, CHF, HTN, seizure disorder, and remote alcohol induced pancreatitis, who presented to the emergency department because of ongoing weakness.   Dialysis Orders: Center: [facility]  on [facility]- last 11/11/2019. EDW 43.5kg, Time: 3.75 hours, 2K/ 2.5 Ca, BFR 400, DFR 600,  Dialyzer: [place] Revacir 200, LUE AVF Heparin 1000 unit load, 500 units/hour (stop 60 min before end of HD)  Assessment/Plan: 1.  ESRD:  [facility] [place].  Continue dialysis today per schedule 2.  Hypertension/volume: Hypertensive and appears relatively euvolemic but weight is significantly increased.  Will try to reach EDW or remove 4 L of ultrafiltration with dialysis today as able.  1.2 L fluid restriction daily.  EDW 43.5kg and consider challenging dry weight as able.  Continue Coreg, Procardia, losartan and titrate as needed. 3.  Anemia: Hemoglobin 8.3. Ferritin 801 and iron sat 17.  Aranesp 60 MCG started weekly 4.  Metabolic bone disease: Calcium and phos at goal. Continue home ferric citrate, sensipar, and calcitriol. 5. Pneumonia?:  Initial opacity on chest x-ray concerning for this diagnosis but unlikely based on further work-up.  No longer on antibiotics 6. Seizures: history of recent break through. Continue home antiepileptics and f/u w/ neuro 7. Failure to thrive: Multiple hospitalizations for weakness and nonspecific symptoms including vomiting.  Some the symptoms may represent advancement of chronic medical issues including possible intra-abdominal ischemia.  Discussed the case with the patient and her son.  The patient has dementia and had a difficult time answering most of the questions but did states she was open to meeting with palliative care.  I discussed the palliative care may be able to help with symptom management and the son is  open to having them see his mother.  Hopefully this can lead to further goals of care conversations in the future.    Recommendations conveyed to primary service.    [facility] 11/16/2019 9:29 AM  ___________________________________________________________  CC: ESRD  Interval History/Subjective: Patient with persistent hypertension overnight.  Intermittently described trouble breathing but oxygen saturations were fine.  Antibiotics stopped due to low suspicion for pneumonia.  Patient describes ongoing nausea.  She also describes poor appetite.  Asked patient what she thinks about recent frequent hospitalizations and ongoing symptoms.  She says that she does not remember much of this.  I discussed the patient's case with her son regarding frequent hospitalization and ongoing symptoms despite being in the hospital frequently.  We discussed the involvement of palliative care   Medications:  Current Facility-Administered Medications  Medication Dose Route Frequency Provider Last Rate Last Admin  . 0.9 %  sodium chloride infusion   Intravenous PRN Emokpae, Courage, MD      . acetaminophen (TYLENOL) tablet 650 mg  650 mg Oral Q4H PRN Adefeso, Oladapo, DO   650 mg at 11/15/19 0543  . albuterol (VENTOLIN HFA) 108 (90 Base) MCG/ACT inhaler 2 puff  2 puff Inhalation Q4H PRN Adefeso, Oladapo, DO      . calcitRIOL (ROCALTROL) capsule 0.25 mcg  0.25 mcg Oral Q T,Th,Sa-HD Adefeso, Oladapo, DO   0.25 mcg at 11/14/19 1212  . carvedilol (COREG) tablet 6.25 mg  6.25 mg Oral BID Adefeso, Oladapo, DO   6.25 mg at 11/15/19 2144  . Chlorhexidine Gluconate Cloth 2 % PADS 6 each  6 each Topical Q0600 Reesa Chew,  MD   6 each at 11/15/19 0543  . cinacalcet (SENSIPAR) tablet 30 mg  30 mg Oral QPM Adefeso, Oladapo, DO   30 mg at 11/15/19 1848  . Darbepoetin Alfa (ARANESP) injection 60 mcg  60 mcg Intravenous Q Tue-HD Reesa Chew, MD   60 mcg at 11/14/19 1748  .  dextromethorphan-guaiFENesin (MUCINEX DM) 30-600 MG per 12 hr tablet 1 tablet  1 tablet Oral BID Adefeso, Oladapo, DO   1 tablet at 11/15/19 2144  . feeding supplement (NEPRO CARB STEADY) liquid 237 mL  237 mL Oral BID BM Emokpae, Courage, MD   237 mL at 11/15/19 1446  . ferric citrate (AURYXIA) tablet 420 mg  420 mg Oral TID WC Adefeso, Oladapo, DO   420 mg at 11/16/19 0834  . folic acid (FOLVITE) tablet 1 mg  1 mg Oral q morning - 10a Adefeso, Oladapo, DO   1 mg at 11/15/19 1105  . heparin injection 5,000 Units  5,000 Units Subcutaneous Q8H Adefeso, Oladapo, DO   5,000 Units at 11/16/19 0508  . hydrALAZINE (APRESOLINE) tablet 25 mg  25 mg Oral Q8H Oswald Hillock, MD   25 mg at 11/16/19 8032  . influenza vac split quadrivalent PF (FLUARIX) injection 0.5 mL  0.5 mL Intramuscular Tomorrow-1000 Emokpae, Courage, MD      . insulin aspart (novoLOG) injection 0-5 Units  0-5 Units Subcutaneous QHS Adefeso, Oladapo, DO      . insulin aspart (novoLOG) injection 0-6 Units  0-6 Units Subcutaneous TID WC Adefeso, Oladapo, DO      . levETIRAcetam (KEPPRA) tablet 1,000 mg  1,000 mg Oral q morning - 10a Adefeso, Oladapo, DO   1,000 mg at 11/15/19 1105  . loperamide (IMODIUM) capsule 4 mg  4 mg Oral Daily PRN Adefeso, Oladapo, DO      . losartan (COZAAR) tablet 25 mg  25 mg Oral Daily Reesa Chew, MD   25 mg at 11/15/19 1110  . metoCLOPramide (REGLAN) tablet 5 mg  5 mg Oral TID AC & HS Darrick Meigs, Marge Duncans, MD      . NIFEdipine (PROCARDIA-XL/NIFEDICAL-XL) 24 hr tablet 30 mg  30 mg Oral q morning - 10a Adefeso, Oladapo, DO   30 mg at 11/15/19 1110  . ondansetron (ZOFRAN) tablet 4 mg  4 mg Oral Q6H PRN Adefeso, Oladapo, DO   4 mg at 11/15/19 0857  . [START ON 11/17/2019] pantoprazole (PROTONIX) injection 40 mg  40 mg Intravenous Q12H Adefeso, Oladapo, DO      . phenytoin (DILANTIN) chewable tablet 300 mg  300 mg Oral Daily Adefeso, Oladapo, DO   300 mg at 11/15/19 1108  . pneumococcal 23 valent vaccine (PNEUMOVAX-23)  injection 0.5 mL  0.5 mL Intramuscular Tomorrow-1000 Emokpae, Courage, MD      . polyethylene glycol (MIRALAX / GLYCOLAX) packet 17 g  17 g Oral Daily Adefeso, Oladapo, DO   17 g at 11/15/19 1102  . polyvinyl alcohol (LIQUIFILM TEARS) 1.4 % ophthalmic solution 1 drop  1 drop Both Eyes PRN Emokpae, Courage, MD      . rOPINIRole (REQUIP) tablet 1 mg  1 mg Oral TID Adefeso, Oladapo, DO   1 mg at 11/15/19 2144  . topiramate (TOPAMAX) tablet 25 mg  25 mg Oral BID Adefeso, Oladapo, DO   25 mg at 11/15/19 2145  . [START ON 11/17/2019] Vitamin D (Ergocalciferol) (DRISDOL) capsule 50,000 Units  50,000 Units Oral Q Woody Seller, Oladapo, DO  Review of Systems: 10 systems reviewed and negative except per interval history/subjective  Physical Exam: Vitals:   11/16/19 0800 11/16/19 0844  BP: (!) 196/89   Pulse: 78   Resp:    Temp:  98 F (36.7 C)  SpO2: 96%    No intake/output data recorded.  Intake/Output Summary (Last 24 hours) at 11/16/2019 0929 Last data filed at 11/15/2019 1847 Gross per 24 hour  Intake 360 ml  Output --  Net 360 ml   GEN: Thin, frail, chronically ill-appearing ENT: no nasal discharge, mmm EYES: no scleral icterus, eomi CV: normal rate PULM: no iwob, bilateral chest rise ABD: NABS, non-distended SKIN: no rashes or jaundice EXT: no edema, warm and well perfused Dialysis Access: LUE AVG + bruit   Test Results I personally reviewed new and old clinical labs and radiology tests Lab Results  Component Value Date   NA 133 (L) 11/15/2019   K 4.8 11/15/2019   CL 95 (L) 11/15/2019   CO2 26 11/15/2019   BUN 19 11/15/2019   CREATININE 2.83 (H) 11/15/2019   CALCIUM 8.2 (L) 11/15/2019   ALBUMIN 3.4 (L) 11/15/2019   PHOS 3.2 11/15/2019

## 2019-11-17 LAB — GLUCOSE, CAPILLARY
Glucose-Capillary: 123 mg/dL — ABNORMAL HIGH (ref 70–99)
Glucose-Capillary: 219 mg/dL — ABNORMAL HIGH (ref 70–99)
Glucose-Capillary: 274 mg/dL — ABNORMAL HIGH (ref 70–99)
Glucose-Capillary: 72 mg/dL (ref 70–99)
Glucose-Capillary: 160 mg/dL — ABNORMAL HIGH (ref 70–99)

## 2019-11-17 LAB — BASIC METABOLIC PANEL
Anion gap: 11 (ref 5–15)
BUN: 12 mg/dL (ref 8–23)
CO2: 28 mmol/L (ref 22–32)
Calcium: 8.5 mg/dL — ABNORMAL LOW (ref 8.9–10.3)
Chloride: 93 mmol/L — ABNORMAL LOW (ref 98–111)
Creatinine, Ser: 2.46 mg/dL — ABNORMAL HIGH (ref 0.44–1.00)
GFR calc Af Amer: 23 mL/min — ABNORMAL LOW (ref >60)
GFR calc non Af Amer: 20 mL/min — ABNORMAL LOW (ref >60)
Glucose, Bld: 189 mg/dL — ABNORMAL HIGH (ref 70–99)
Potassium: 3.4 mmol/L — ABNORMAL LOW (ref 3.5–5.1)
Sodium: 132 mmol/L — ABNORMAL LOW (ref 135–145)

## 2019-11-17 NOTE — Evaluation (Signed)
Physical Therapy Evaluation Patient Details Name: ANAKAREN CAMPION MRN: [id number] DOB: [birth year] Today's Date: 11/17/2019   History of Present Illness  DYMOND GUTT is a 64 y.o. female with medical history significant for seizures, ESRD on HD on TTS, type II DM, COPD, diastolic CHF, stroke and hypertension who presents to the emergency department due to worsening weakness within last 3 days.  Patient was unable to provide details about her illness, history was obtained from daughter and son (by phone) as well as from ED physician and ED chart.  Per report, patient has about 2 week history of complaining of intermittent weakness and occasional vomiting which appears to be worse after dialysis, patient was reported to present with worsening weakness after dialysis on Saturday (9/25), so she presented to her PCP today who noted that she was dehydrated and advised her to go to the ED for rehydration.  Patient lives with son, ambulates with a walker, she does not smoke, but she dips tobacco.  She had an episode of vomiting while in the ED which was tested for occult blood, but was negative.  She denies fever, chills, cough, worsening shortness of breath or any sick contact.    Clinical Impression  Patient limited for functional mobility as stated below secondary to BLE weakness, fatigue and poor standing balance. Patient able to transfer to seated EOB without assist. She demonstrates good sitting tolerance and sitting balance without assist at EOB. Patient transfers to standing with RW requiring min/mod assist secondary to weakness. Her bilateral LE begin trembling upon initial transition to standing. She is limited to several small, shuffled steps at bedside to ambulate to chair with RW secondary to weakness and fatigue with LE trembling throughout requiring mod/max assist. Patient ended session seated in chair. Patient will benefit from continued physical therapy in hospital and recommended venue  below to increase strength, balance, endurance for safe ADLs and gait.     Follow Up Recommendations SNF;Supervision/Assistance - 24 hour;Supervision for mobility/OOB    Equipment Recommendations  None recommended by PT    Recommendations for Other Services       Precautions / Restrictions Precautions Precautions: Fall Restrictions Weight Bearing Restrictions: No      Mobility  Bed Mobility Overal bed mobility: Modified Independent             General bed mobility comments: slighly slow, labored transition to seated EOB  Transfers Overall transfer level: Needs assistance Equipment used: Rolling walker (2 wheeled) Transfers: Sit to/from [facility] Sit to Stand: Min assist;Mod assist Stand pivot transfers: Min assist;Mod assist       General transfer comment: transfer to standing with RW, LE trembling with initial transfer, physical assist for weakness  Ambulation/Gait Ambulation/Gait assistance: Mod assist;Max assist Gait Distance (Feet): 3 Feet Assistive device: Rolling walker (2 wheeled) Gait Pattern/deviations: Shuffle;Decreased step length - right;Decreased step length - left Gait velocity: decreased   General Gait Details: slow, labored unsteady gait with use of RW, BLE trembling throughout, cueing for sequencing and physical assist for weakness/balance  Stairs            Wheelchair Mobility    Modified Rankin (Stroke Patients Only)       Balance Overall balance assessment: Needs assistance   Sitting balance-Leahy Scale: Good Sitting balance - Comments: seated EOB   Standing balance support: Bilateral upper extremity supported Standing balance-Leahy Scale: Poor Standing balance comment: with RW  Pertinent Vitals/Pain Pain Assessment: No/denies pain    Home Living Family/patient expects to be discharged to:: Private residence Living Arrangements: Children Available Help at  Discharge: Family;Available PRN/intermittently Type of Home: Apartment Home Access: Level entry     Home Layout: One level Home Equipment: Walker - 2 wheels;Cane - single point;Bedside commode      Prior Function Level of Independence: Needs assistance   Gait / Transfers Assistance Needed: Patient states household ambulator with RW  ADL's / Homemaking Assistance Needed: Patient able to complete most basic ADL, family assists PRN        Hand Dominance        Extremity/Trunk Assessment   Upper Extremity Assessment Upper Extremity Assessment: Generalized weakness    Lower Extremity Assessment Lower Extremity Assessment: Generalized weakness    Cervical / Trunk Assessment Cervical / Trunk Assessment: Normal  Communication   Communication: No difficulties  Cognition Arousal/Alertness: Awake/alert Behavior During Therapy: WFL for tasks assessed/performed Overall Cognitive Status: Within Functional Limits for tasks assessed                                        General Comments      Exercises     Assessment/Plan    PT Assessment Patient needs continued PT services  PT Problem List Decreased strength;Decreased activity tolerance;Decreased balance;Decreased mobility       PT Treatment Interventions DME instruction;Balance training;Gait training;Neuromuscular re-education;Stair training;Functional mobility training;Patient/family education;Therapeutic activities;Therapeutic exercise    PT Goals (Current goals can be found in the Care Plan section)  Acute Rehab PT Goals Patient Stated Goal: Return home PT Goal Formulation: With patient Time For Goal Achievement: 12/01/19 Potential to Achieve Goals: Fair    Frequency Min 3X/week   Barriers to discharge        Co-evaluation               AM-PAC PT "6 Clicks" Mobility  Outcome Measure Help needed turning from your back to your side while in a flat bed without using bedrails?:  None Help needed moving from lying on your back to sitting on the side of a flat bed without using bedrails?: A Little Help needed moving to and from a bed to a chair (including a wheelchair)?: A Lot Help needed standing up from a chair using your arms (e.g., wheelchair or bedside chair)?: A Lot Help needed to walk in hospital room?: A Lot Help needed climbing 3-5 steps with a railing? : Total 6 Click Score: 14    End of Session Equipment Utilized During Treatment: Gait belt Activity Tolerance: Patient limited by fatigue Patient left: in chair;with call bell/phone within reach;with chair alarm set Nurse Communication: Mobility status PT Visit Diagnosis: Unsteadiness on feet (R26.81);Other abnormalities of gait and mobility (R26.89);Muscle weakness (generalized) (M62.81)    Time: 7078-6754 PT Time Calculation (min) (ACUTE ONLY): 17 min   Charges:   PT Evaluation $PT Eval Low Complexity: 1 Low PT Treatments $Therapeutic Activity: 8-22 mins        8:56 AM, 11/17/19 Mearl Latin PT, DPT Physical Therapist at [facility]

## 2019-11-17 NOTE — TOC Progression Note (Signed)
Transition of Care [facility]) - Progression Note    Patient Details  Name: Erica [name] MRN: [id number] Date of Birth: 07-09-1955  Transition of Care [facility]) CM/SW [place], [place] Phone Number: 11/17/2019, 1:33 PM  Clinical Narrative:    TOC spoke with pt to inform her of PT recommendation of SNF for rehab. Pt acceptable to referral being made to local facilities. CSW completed Fl2 and has referred pt out. CSW called pts son [name] to update on PT recommendation and pts information being sent out. TOC to update pt and family when bed offers made. TOC to follow    Expected Discharge Plan: Home/Self Care Barriers to Discharge: Continued Medical Work up  Expected Discharge Plan and Services Expected Discharge Plan: Home/Self Care In-house Referral: Clinical Social Work     Living arrangements for the past 2 months: Apartment                                       Social Determinants of Health (SDOH) Interventions    Readmission Risk Interventions Readmission Risk Prevention Plan 11/15/2019 11/14/2019  Transportation Screening - Complete  Medication Review [employer]) - Complete  PCP or Specialist appointment within 3-5 days of discharge Complete -  [place] or [facility] - Complete  SW Recovery Care/Counseling Consult - Complete  [place] - Not Applicable  Some recent data might be hidden

## 2019-11-17 NOTE — Progress Notes (Signed)
Pt ate a small amount of chicken, all of her apple crisp dessert and a nepro. No c/o n/v, asleep at this time.

## 2019-11-17 NOTE — NC FL2 (Signed)
[place] MEDICAID FL2 LEVEL OF CARE SCREENING TOOL     IDENTIFICATION  Patient Name: Erica [name] Birthdate: [birth year] Sex: female Admission Date (Current Location): 11/13/2019  [facility] and [place] Number:  [employer] and Address:  [place] [address] [place]      Provider Number: [id number]  Attending Physician Name and Address:  Oswald Hillock, MD  Relative Name and Phone Number:  [name] [phone]    Current Level of Care: Hospital Recommended Level of Care: [facility] Prior Approval Number:    Date Approved/Denied:   PASRR Number: [id number]  Discharge Plan: SNF    Current Diagnoses: Patient Active Problem List   Diagnosis Date Noted  . Palliative care by specialist   . Generalized weakness 11/14/2019  . HCAP (healthcare-associated pneumonia) 11/14/2019  . Hyperkalemia 11/13/2019  . Hyponatremia 09/27/2019  . Closed fracture of right proximal humerus 04/20/2019  . Surgery, elective   . Seizure ([place]) 11/12/2018  . History of hydronephrosis --stents in place 12/08/2017  . Stroke ([place]) 11/29/2017  . Seizures ([place])   . Hydronephrosis   . Recurrent UTI   . Diabetes mellitus type 2 in nonobese (HCC)   . ESRD (end stage renal disease) ([place])   . Anemia of chronic disease   . Chronic diastolic congestive heart failure ([place])   . Essential hypertension   . PICC (peripherally inserted central catheter) in place   . Acute pyelonephritis 11/12/2017  . Uncontrolled type 2 diabetes mellitus with hyperglycemia, with long-term current use of insulin ([place]) 11/10/2017  . Renal failure 11/08/2017  . Seizure disorder ([place]) 11/08/2017  . Orthostatic hypotension 07/25/2014  . Moderate protein malnutrition ([place]) 09/15/2013  . Aspiration pneumonia ([place]) 09/15/2013  . Acute respiratory failure requiring reintubation ([place]) 09/11/2013  . probable Seizures due to metabolic disorder 01/75/1025  . Lactic  acidosis 03/19/2013  . Abdominal pain 03/19/2013  . Rotavirus infection 10/29/2012  . Type II or unspecified type diabetes mellitus without mention of complication, uncontrolled 10/29/2012  . Protein-calorie malnutrition, severe ([place]) 10/27/2012  . NSTEMI (non-ST elevated myocardial infarction) ([place]) 10/26/2012  . Fever, unspecified 10/26/2012  . Hypotension 10/25/2012  . Metabolic acidosis 85/27/7824  . Chronic diarrhea 10/25/2012  . Tobacco abuse 10/25/2012  . DKA (diabetic ketoacidoses) 09/09/2012  . Dehydration 09/09/2012  . DKA, type 2 ([place]) 05/20/2012  . Abnormal LFTs 05/20/2012  . Heart murmur, systolic 23/53/6144  . Hypoglycemia 07/08/2011  . Metabolic encephalopathy 31/54/0086  . Alcohol abuse 07/08/2011  . Hypokalemia 07/08/2011  . Nausea & vomiting 07/08/2011  . H/O chronic pancreatitis 07/08/2011    Orientation RESPIRATION BLADDER Height & Weight     Time, Self, Situation, Place  Normal Continent Weight: 103 lb 2.8 oz (46.8 kg) Height:  5' (152.4 cm)  BEHAVIORAL SYMPTOMS/MOOD NEUROLOGICAL BOWEL NUTRITION STATUS      Continent Diet (Diet renal with fluid restriction Fluid restriction: 1200 mL Fluid. See d/c summary.)  AMBULATORY STATUS COMMUNICATION OF NEEDS Skin   Extensive Assist Verbally Normal                       Personal Care Assistance Level of Assistance  Bathing, Feeding, Dressing Bathing Assistance: Maximum assistance Feeding assistance: Limited assistance Dressing Assistance: Maximum assistance     Functional Limitations Info  Sight, Hearing, Speech Sight Info: Impaired Hearing Info: Adequate Speech Info: Adequate    SPECIAL CARE FACTORS FREQUENCY  PT (By licensed PT), OT (By licensed OT)  PT Frequency: 5 times weekly OT Frequency: 5 times weekly            Contractures Contractures Info: Not present    Additional Factors Info  Code Status, Allergies Code Status Info: Full Allergies Info: Aspirin           Current  Medications (11/17/2019):  This is the current hospital active medication list Current Facility-Administered Medications  Medication Dose Route Frequency Provider Last Rate Last Admin  . 0.9 %  sodium chloride infusion   Intravenous PRN Emokpae, Courage, MD      . acetaminophen (TYLENOL) tablet 650 mg  650 mg Oral Q4H PRN Adefeso, Oladapo, DO   650 mg at 11/15/19 0543  . albuterol (VENTOLIN HFA) 108 (90 Base) MCG/ACT inhaler 2 puff  2 puff Inhalation Q4H PRN Adefeso, Oladapo, DO      . calcitRIOL (ROCALTROL) capsule 0.25 mcg  0.25 mcg Oral Q T,Th,Sa-HD Adefeso, Oladapo, DO   0.25 mcg at 11/16/19 1116  . carvedilol (COREG) tablet 6.25 mg  6.25 mg Oral BID Adefeso, Oladapo, DO   6.25 mg at 11/17/19 1043  . Chlorhexidine Gluconate Cloth 2 % PADS 6 each  6 each Topical Q0600 Reesa Chew, MD   6 each at 11/17/19 0530  . cinacalcet (SENSIPAR) tablet 30 mg  30 mg Oral QPM Adefeso, Oladapo, DO   30 mg at 11/16/19 1836  . Darbepoetin Alfa (ARANESP) injection 60 mcg  60 mcg Intravenous Q Tue-HD Reesa Chew, MD   60 mcg at 11/14/19 1748  . dextromethorphan-guaiFENesin (MUCINEX DM) 30-600 MG per 12 hr tablet 1 tablet  1 tablet Oral BID Adefeso, Oladapo, DO   1 tablet at 11/17/19 1044  . feeding supplement (NEPRO CARB STEADY) liquid 237 mL  237 mL Oral BID BM Emokpae, Courage, MD   237 mL at 11/17/19 1045  . ferric citrate (AURYXIA) tablet 420 mg  420 mg Oral TID WC Adefeso, Oladapo, DO   420 mg at 11/17/19 1103  . folic acid (FOLVITE) tablet 1 mg  1 mg Oral q morning - 10a Adefeso, Oladapo, DO   1 mg at 11/17/19 1045  . heparin injection 1,000 Units  1,000 Units Dialysis PRN Reesa Chew, MD      . heparin injection 5,000 Units  5,000 Units Subcutaneous Q8H Adefeso, Oladapo, DO   5,000 Units at 11/17/19 0523  . hydrALAZINE (APRESOLINE) tablet 25 mg  25 mg Oral Q8H Oswald Hillock, MD   25 mg at 11/17/19 0524  . influenza vac split quadrivalent PF (FLUARIX) injection 0.5 mL  0.5 mL Intramuscular  Tomorrow-1000 Emokpae, Courage, MD      . insulin aspart (novoLOG) injection 0-5 Units  0-5 Units Subcutaneous QHS Adefeso, Oladapo, DO   2 Units at 11/16/19 2236  . insulin aspart (novoLOG) injection 0-6 Units  0-6 Units Subcutaneous TID WC Adefeso, Oladapo, DO   3 Units at 11/17/19 1246  . levETIRAcetam (KEPPRA) tablet 1,000 mg  1,000 mg Oral q morning - 10a Adefeso, Oladapo, DO   1,000 mg at 11/17/19 1045  . loperamide (IMODIUM) capsule 4 mg  4 mg Oral Daily PRN Adefeso, Oladapo, DO      . losartan (COZAAR) tablet 25 mg  25 mg Oral Daily Reesa Chew, MD   25 mg at 11/17/19 1046  . metoCLOPramide (REGLAN) tablet 5 mg  5 mg Oral TID AC & HS Oswald Hillock, MD   5 mg at 11/17/19 1046  . NIFEdipine (PROCARDIA-XL/NIFEDICAL-XL)  24 hr tablet 30 mg  30 mg Oral q morning - 10a Adefeso, Oladapo, DO   30 mg at 11/17/19 1047  . ondansetron (ZOFRAN) tablet 4 mg  4 mg Oral Q6H PRN Adefeso, Oladapo, DO   4 mg at 11/15/19 0857  . pantoprazole (PROTONIX) injection 40 mg  40 mg Intravenous Q12H Adefeso, Oladapo, DO      . phenytoin (DILANTIN) chewable tablet 300 mg  300 mg Oral Daily Adefeso, Oladapo, DO   300 mg at 11/17/19 1049  . pneumococcal 23 valent vaccine (PNEUMOVAX-23) injection 0.5 mL  0.5 mL Intramuscular Tomorrow-1000 Emokpae, Courage, MD      . polyethylene glycol (MIRALAX / GLYCOLAX) packet 17 g  17 g Oral Daily Adefeso, Oladapo, DO   17 g at 11/16/19 1101  . polyvinyl alcohol (LIQUIFILM TEARS) 1.4 % ophthalmic solution 1 drop  1 drop Both Eyes PRN Emokpae, Courage, MD      . rOPINIRole (REQUIP) tablet 1 mg  1 mg Oral TID Adefeso, Oladapo, DO   1 mg at 11/17/19 1048  . topiramate (TOPAMAX) tablet 25 mg  25 mg Oral BID Adefeso, Oladapo, DO   25 mg at 11/17/19 1048  . Vitamin D (Ergocalciferol) (DRISDOL) capsule 50,000 Units  50,000 Units Oral Q Fri Adefeso, Oladapo, DO   50,000 Units at 11/17/19 1049     Discharge Medications: Please see discharge summary for a list of discharge  medications.  Relevant Imaging Results:  Relevant Lab Results:   Additional Information [phone]. Dialysis patient - TTS [address], [place]

## 2019-11-17 NOTE — Care Management Important Message (Signed)
Important Message  Patient Details  Name: Erica [name] MRN: [id number] Date of Birth: 10-27-1955   Medicare Important Message Given:  Yes     Tommy Medal 11/17/2019, 1:11 PM

## 2019-11-17 NOTE — Progress Notes (Signed)
Nephrology Follow-Up Consult note   Assessment/Recommendations: Erica [name] is a/an 64 y.o. female with a past medical history significant for ESRD on dialysis at [facility] (TTS), BM, CHF, HTN, seizure disorder, and remote alcohol induced pancreatitis, who presented to the emergency department because of ongoing weakness.   Dialysis Orders: Center: [facility]  on [facility]- last 11/11/2019. EDW 43.5kg, Time: 3.75 hours, 2K/ 2.5 Ca, BFR 400, DFR 600,  Dialyzer: [place]ro Revacir 200, LUE AVF Heparin 1000 unit load, 500 units/hour (stop 60 min before end of HD)  Assessment/Plan: 1.  ESRD:  [facility] [place].  Continue dialysis per schedule 2.  Hypertension/volume:  Hypertension improved with volume removal on dialysis.  EDW estimated to be 43.5 kg.  Continue to dialyze to EDW and challenge if needed.  Continue Coreg, Procardia, losartan and titrate as needed. 3.  Anemia: Hemoglobin 9.4. Ferritin 801 and iron sat 17.  Aranesp 60 MCG started weekly this admission 4.  Metabolic bone disease: Calcium and phos at goal. Continue home ferric citrate, sensipar, and calcitriol. 5. Seizures: history of recent break through. Continue home antiepileptics and f/u w/ neuro 6. Failure to thrive:  Multiple hospitalizations for weakness, nausea, poor appetite.  Greatly appreciate palliative care's involvement.  Likely to follow outpatient as well    Recommendations conveyed to primary service.    [facility] 11/17/2019 8:16 AM  ___________________________________________________________  CC: ESRD  Interval History/Subjective: Patient is tired and has difficulty answering questions.  Says that her left arm around her graft feels a little swollen and tender.  Otherwise denies any significant complaints.  Tolerated dialysis yesterday with 3.5 L removed.   Medications:  Current Facility-Administered Medications  Medication Dose Route Frequency Provider Last Rate Last Admin  .  0.9 %  sodium chloride infusion   Intravenous PRN Emokpae, Courage, MD      . acetaminophen (TYLENOL) tablet 650 mg  650 mg Oral Q4H PRN Adefeso, Oladapo, DO   650 mg at 11/15/19 0543  . albuterol (VENTOLIN HFA) 108 (90 Base) MCG/ACT inhaler 2 puff  2 puff Inhalation Q4H PRN Adefeso, Oladapo, DO      . calcitRIOL (ROCALTROL) capsule 0.25 mcg  0.25 mcg Oral Q T,Th,Sa-HD Adefeso, Oladapo, DO   0.25 mcg at 11/16/19 1116  . carvedilol (COREG) tablet 6.25 mg  6.25 mg Oral BID Adefeso, Oladapo, DO   6.25 mg at 11/16/19 2236  . Chlorhexidine Gluconate Cloth 2 % PADS 6 each  6 each Topical Q0600 Reesa Chew, MD   6 each at 11/17/19 0530  . cinacalcet (SENSIPAR) tablet 30 mg  30 mg Oral QPM Adefeso, Oladapo, DO   30 mg at 11/16/19 1836  . Darbepoetin Alfa (ARANESP) injection 60 mcg  60 mcg Intravenous Q Tue-HD Reesa Chew, MD   60 mcg at 11/14/19 1748  . dextromethorphan-guaiFENesin (MUCINEX DM) 30-600 MG per 12 hr tablet 1 tablet  1 tablet Oral BID Adefeso, Oladapo, DO   1 tablet at 11/16/19 2234  . feeding supplement (NEPRO CARB STEADY) liquid 237 mL  237 mL Oral BID BM Emokpae, Courage, MD   237 mL at 11/16/19 1700  . ferric citrate (AURYXIA) tablet 420 mg  420 mg Oral TID WC Adefeso, Oladapo, DO   420 mg at 11/16/19 1838  . folic acid (FOLVITE) tablet 1 mg  1 mg Oral q morning - 10a Adefeso, Oladapo, DO   1 mg at 11/16/19 1111  . heparin injection 1,000 Units  1,000 Units Dialysis  PRN Reesa Chew, MD      . heparin injection 5,000 Units  5,000 Units Subcutaneous Q8H Adefeso, Oladapo, DO   5,000 Units at 11/17/19 0523  . hydrALAZINE (APRESOLINE) tablet 25 mg  25 mg Oral Q8H Oswald Hillock, MD   25 mg at 11/17/19 0524  . influenza vac split quadrivalent PF (FLUARIX) injection 0.5 mL  0.5 mL Intramuscular Tomorrow-1000 Emokpae, Courage, MD      . insulin aspart (novoLOG) injection 0-5 Units  0-5 Units Subcutaneous QHS Adefeso, Oladapo, DO   2 Units at 11/16/19 2236  . insulin aspart  (novoLOG) injection 0-6 Units  0-6 Units Subcutaneous TID WC Adefeso, Oladapo, DO      . levETIRAcetam (KEPPRA) tablet 1,000 mg  1,000 mg Oral q morning - 10a Adefeso, Oladapo, DO   1,000 mg at 11/16/19 1111  . loperamide (IMODIUM) capsule 4 mg  4 mg Oral Daily PRN Adefeso, Oladapo, DO      . losartan (COZAAR) tablet 25 mg  25 mg Oral Daily Reesa Chew, MD   25 mg at 11/16/19 1111  . metoCLOPramide (REGLAN) tablet 5 mg  5 mg Oral TID AC & HS Oswald Hillock, MD   5 mg at 11/16/19 2237  . NIFEdipine (PROCARDIA-XL/NIFEDICAL-XL) 24 hr tablet 30 mg  30 mg Oral q morning - 10a Adefeso, Oladapo, DO   30 mg at 11/16/19 1112  . ondansetron (ZOFRAN) tablet 4 mg  4 mg Oral Q6H PRN Adefeso, Oladapo, DO   4 mg at 11/15/19 0857  . pantoprazole (PROTONIX) injection 40 mg  40 mg Intravenous Q12H Adefeso, Oladapo, DO      . phenytoin (DILANTIN) chewable tablet 300 mg  300 mg Oral Daily Adefeso, Oladapo, DO   300 mg at 11/16/19 1112  . pneumococcal 23 valent vaccine (PNEUMOVAX-23) injection 0.5 mL  0.5 mL Intramuscular Tomorrow-1000 Emokpae, Courage, MD      . polyethylene glycol (MIRALAX / GLYCOLAX) packet 17 g  17 g Oral Daily Adefeso, Oladapo, DO   17 g at 11/16/19 1101  . polyvinyl alcohol (LIQUIFILM TEARS) 1.4 % ophthalmic solution 1 drop  1 drop Both Eyes PRN Emokpae, Courage, MD      . rOPINIRole (REQUIP) tablet 1 mg  1 mg Oral TID Adefeso, Oladapo, DO   1 mg at 11/16/19 2234  . topiramate (TOPAMAX) tablet 25 mg  25 mg Oral BID Adefeso, Oladapo, DO   25 mg at 11/16/19 2233  . Vitamin D (Ergocalciferol) (DRISDOL) capsule 50,000 Units  50,000 Units Oral Q Fri Adefeso, Oladapo, DO          Review of Systems: 10 systems reviewed and negative except per interval history/subjective  Physical Exam: Vitals:   11/16/19 2037 11/17/19 0524  BP: (!) 147/68 (!) 147/61  Pulse: (!) 103 71  Resp: 20 18  Temp: 99 F (37.2 C) 98.5 F (36.9 C)  SpO2: 99% 100%   No intake/output data recorded.  Intake/Output  Summary (Last 24 hours) at 11/17/2019 0816 Last data filed at 11/16/2019 1830 Gross per 24 hour  Intake 120 ml  Output 3700 ml  Net -3580 ml   GEN: Thin, frail, chronically ill-appearing ENT: no nasal discharge, mmm EYES: no scleral icterus, eomi CV: normal rate PULM: no iwob, bilateral chest rise ABD: NABS, non-distended SKIN: no rashes or jaundice EXT: no edema, warm and well perfused Dialysis Access: LUE AVG + bruit and no edema   Test Results I personally reviewed new and old clinical  labs and radiology tests Lab Results  Component Value Date   NA 132 (L) 11/16/2019   NA 132 (L) 11/16/2019   K 5.4 (H) 11/16/2019   K 5.3 (H) 11/16/2019   CL 94 (L) 11/16/2019   CL 95 (L) 11/16/2019   CO2 25 11/16/2019   CO2 25 11/16/2019   BUN 25 (H) 11/16/2019   BUN 25 (H) 11/16/2019   CREATININE 3.74 (H) 11/16/2019   CREATININE 3.68 (H) 11/16/2019   CALCIUM 8.2 (L) 11/16/2019   CALCIUM 8.3 (L) 11/16/2019   ALBUMIN 3.6 11/16/2019   PHOS 3.5 11/16/2019

## 2019-11-17 NOTE — Evaluation (Signed)
Clinical/Bedside Swallow Evaluation Patient Details  Name: Erica [name] MRN: [id number] Date of Birth: [birth year]  Today's Date: 11/17/2019 Time: SLP Start Time (ACUTE ONLY): 0930 SLP Stop Time (ACUTE ONLY): 0956 SLP Time Calculation (min) (ACUTE ONLY): 26 min  Past Medical History:  Past Medical History:  Diagnosis Date   Alcohol-induced pancreatitis    Chronic diarrhea    Depression    Diabetes mellitus    fasting blood sugar 258-527P   Diastolic CHF (HCC)    DKA (diabetic ketoacidoses) (HCC)    Gastroparesis    GERD (gastroesophageal reflux disease)    Heart murmur    History of kidney stones    Hyperlipidemia    Hypertension    Hypokalemia    Muscle spasm    Neuropathic pain    Neuropathy    Hx: of   Pyelonephritis    Seizures ([place])    Vitamin B12 deficiency    Vitamin D deficiency    Past Surgical History:  Past Surgical History:  Procedure Laterality Date   AV FISTULA PLACEMENT Left 12/15/2017   Procedure: INSERTION OF ARTERIOVENOUS (AV) GORE-TEX GRAFT ARM;  Surgeon: Rosetta Posner, MD;  Location: [facility] OR;  Service: Vascular;  Laterality: Left;   CATARACT EXTRACTION W/PHACO Right 03/14/2015   Procedure: CATARACT EXTRACTION PHACO AND INTRAOCULAR LENS PLACEMENT ([place]);  Surgeon: Baruch Goldmann, MD;  Location: [facility] ORS;  Service: Ophthalmology;  Laterality: Right;  CDE:11.13   CATARACT EXTRACTION W/PHACO Left 04/11/2015   Procedure: CATARACT EXTRACTION PHACO AND INTRAOCULAR LENS PLACEMENT LEFT EYE CDE=9.68;  Surgeon: Baruch Goldmann, MD;  Location: [facility] ORS;  Service: Ophthalmology;  Laterality: Left;   COLONOSCOPY  02/24/2010   cystoscopy with ureteral stent  Bilateral 10/07/2017   At [place] CV LINE RIGHT  11/17/2017   IR FLUORO GUIDE CV LINE RIGHT  11/22/2017   IR REMOVAL TUN CV CATH W/O FL  01/26/2018   IR US GUIDE VASC ACCESS RIGHT  11/17/2017   MULTIPLE EXTRACTIONS WITH ALVEOLOPLASTY N/A 06/13/2012   Procedure:  MULTIPLE EXTRACION WITH ALVEOLOPLASTY EXTRACT: 18, 19, 20, 21, 22, 24, 25, 27, 28, 29, 30, 31;  Surgeon: Gae Bon, DDS;  Location: [place];  Service: Oral Surgery;  Laterality: N/A;   ORIF HUMERUS FRACTURE Right 04/20/2019   Procedure: OPEN REDUCTION INTERNAL FIXATION (ORIF) PROXIMAL HUMERUS FRACTURE;  Surgeon: Marchia Bond, MD;  Location: [place];  Service: Orthopedics;  Laterality: Right;   TUBAL LIGATION     URETERAL STENT PLACEMENT  09/2017   HPI:  Erica [name] is a/an 64 y.o. female with a past medical history significant for ESRD on dialysis at [facility] (TTS), BM, CHF, HTN, seizure disorder, and remote alcohol induced pancreatitis, who presented to the emergency department because of ongoing weakness. Pt reports pills get "stuck"; BSE ordered.   Assessment / Plan / Recommendation Clinical Impression  Clinical swallowing evaluation completed while Pt was sitting upright in the chair; Pt consumed thin liquids, puree and regular textures without overt s/sx of oropharyngeal dysphagia. Pt is edentulous and reports she eats "whatever she wants" at home and that it just takes her a "long time to chew food up". Pt does have hx of being on thickened liquids (2 years ago per chart review) however, she has been tolerating regular/thin diet for a long time now. Pt reports globus sensation when she takes her pills, further reporting she often tries to swallow 4-5 at a time whole with liquid. SLP provided education  for Pt to take pills whole with applesauce ONE at a time, or two (if they are small) at a time and to follow administration with liquid wash. SLP reviewed universal aspiration precautions. There are no further ST needs noted at this time, thank you for this consult. ST to sign off.   SLP Visit Diagnosis: Dysphagia, unspecified (R13.10)    Aspiration Risk  Mild aspiration risk    Diet Recommendation Regular;Thin liquid   Liquid Administration via: Cup;Straw Medication  Administration: Whole meds with puree Supervision: Patient able to self feed;Intermittent supervision to cue for compensatory strategies Compensations: Minimize environmental distractions;Slow rate;Small sips/bites;Follow solids with liquid Postural Changes: Seated upright at 90 degrees;Remain upright for at least 30 minutes after po intake    Other  Recommendations Oral Care Recommendations: Oral care BID     Swallow Study   General Date of Onset: 11/13/19 HPI: Erica [name] is a/an 64 y.o. female with a past medical history significant for ESRD on dialysis at [facility] (TTS), BM, CHF, HTN, seizure disorder, and remote alcohol induced pancreatitis, who presented to the emergency department because of ongoing weakness. Pt reports pills get "stuck"; BSE ordered. Type of Study: Bedside Swallow Evaluation Previous Swallow Assessment: BSE 2019 Diet Prior to this Study: Regular;Thin liquids Temperature Spikes Noted: No Respiratory Status: Room air History of Recent Intubation: No Behavior/Cognition: Alert;Cooperative;Pleasant mood Oral Cavity Assessment: Within Functional Limits Oral Care Completed by SLP: Recent completion by staff Oral Cavity - Dentition: Edentulous Vision: Functional for self-feeding Self-Feeding Abilities: Able to feed self Patient Positioning: Upright in bed Baseline Vocal Quality: Normal Volitional Cough: Strong Volitional Swallow: Able to elicit    Oral/Motor/Sensory Function Overall Oral Motor/Sensory Function: Within functional limits   Ice Chips Ice chips: Within functional limits   Thin Liquid Thin Liquid: Within functional limits    Nectar Thick Nectar Thick Liquid: Not tested   Honey Thick Honey Thick Liquid: Not tested   Puree Puree: Within functional limits   Solid     Solid: Within functional limits     Erica [name] H. Roddie Mc, [place] Speech Language Pathologist  Wende Bushy 11/17/2019,10:00 AM

## 2019-11-17 NOTE — Progress Notes (Signed)
[facility]  PROGRESS NOTE  Erica [name]:[id number] DOB: 1955/09/29 DOA: 11/13/2019 PCP: [name], NP   Brief HPI:   64 year old female with history of ESRD on hemodialysis [facility], congestive heart failure, hypertension, seizure disorder, remote alcohol induced pancreatitis was admitted on 11/13/2019 due to fatigue, malaise, cough and hyperkalemia.    Subjective   Patient seen and examined, nausea improved after starting Reglan.   Assessment/Plan:     1. Hyperkalemia-resolved, patient presents with hyperkalemia, received calcium gluconate, insulin.  Underwent hemodialysis yesterday.  This morning potassium is 4.8. 2. Recurrent nausea-?  Gastroparesis, patient has had history of recurrent nausea, she does not have IV access at this time.  Improved after she was started on Reglan 5 mg 4 times a day.  Continue as needed Zofran.   3. Pneumonia-patient was empirically started on vancomycin and cefepime for?  Pneumonia.  Chest x-ray  showed mild interstitial edema.  She continues to be afebrile, procalcitonin 0.38, WBC 7.2, currently O2 sats 94% room air.   IV antibiotics were discontinued.   4. HFpEF-patient has history of contrast leak CHF, chest x-ray shows mild interstitial edema.  Hemodialysis as per nephrology. 5. Diabetes mellitus type 2-continue sliding scale insulin with NovoLog. 6. Hypertension-continue nifedipine, Coreg, hydralazine.  Blood pressure is well controlled. 7. History of seizure disorder-continue Dilantin, Topamax, Keppra.  Goals of care-palliative care consult was obtained, patient is full code.  Full scope of care and outpatient palliative care referral.     COVID-19 Labs  Recent Labs    11/14/19 0953  FERRITIN 801*    Lab Results  Component Value Date   SARSCOV2NAA NEGATIVE 11/13/2019   SARSCOV2NAA NEGATIVE 10/25/2019   [place] NEGATIVE 09/27/2019   [place] NEGATIVE 04/20/2019     Scheduled medications:   . calcitRIOL   0.25 mcg Oral Q T,Th,Sa-HD  . carvedilol  6.25 mg Oral BID  . Chlorhexidine Gluconate Cloth  6 each Topical Q0600  . cinacalcet  30 mg Oral QPM  . darbepoetin (ARANESP) injection - DIALYSIS  60 mcg Intravenous Q Tue-HD  . dextromethorphan-guaiFENesin  1 tablet Oral BID  . feeding supplement (NEPRO CARB STEADY)  237 mL Oral BID BM  . ferric citrate  420 mg Oral TID WC  . folic acid  1 mg Oral q morning - 10a  . heparin injection (subcutaneous)  5,000 Units Subcutaneous Q8H  . hydrALAZINE  25 mg Oral Q8H  . influenza vac split quadrivalent PF  0.5 mL Intramuscular Tomorrow-1000  . insulin aspart  0-5 Units Subcutaneous QHS  . insulin aspart  0-6 Units Subcutaneous TID WC  . levETIRAcetam  1,000 mg Oral q morning - 10a  . losartan  25 mg Oral Daily  . metoCLOPramide  5 mg Oral TID AC & HS  . NIFEdipine  30 mg Oral q morning - 10a  . pantoprazole  40 mg Intravenous Q12H  . phenytoin  300 mg Oral Daily  . pneumococcal 23 valent vaccine  0.5 mL Intramuscular Tomorrow-1000  . polyethylene glycol  17 g Oral Daily  . rOPINIRole  1 mg Oral TID  . topiramate  25 mg Oral BID  . Vitamin D (Ergocalciferol)  50,000 Units Oral Q Fri         CBG: Recent Labs  Lab 11/16/19 1136 11/16/19 1639 11/16/19 2207 11/17/19 0310 11/17/19 0758  GLUCAP 77 110* 234* 123* 219*    SpO2: 100 % O2 Flow Rate (L/min): 2 L/min    CBC: Recent Labs  Lab 11/13/19 1743 11/14/19 0540 11/16/19 0941  WBC 8.7 7.2 5.2  HGB 10.5* 8.3* 9.4*  HCT 34.8* 27.3* 29.6*  MCV 113.4* 112.3* 108.0*  PLT 172 164 580    Basic Metabolic Panel: Recent Labs  Lab 11/13/19 1743 11/14/19 0540 11/15/19 0827 11/16/19 0941 11/17/19 0853  NA 134* 134* 133* 132*  132* 132*  K 6.5* 6.2* 4.8 5.3*  5.4* 3.4*  CL 96* 98 95* 95*  94* 93*  CO2 27 25 26 25  25 28   GLUCOSE 133* 94 108* 89  88 189*  BUN 36* 40* 19 25*  25* 12  CREATININE 4.18* 4.48* 2.83* 3.68*  3.74* 2.46*  CALCIUM 9.0 8.4* 8.2* 8.3*  8.2* 8.5*   MG  --  2.6*  --   --   --   PHOS  --  4.0 3.2 3.5  --      Liver Function Tests: Recent Labs  Lab 11/14/19 0540 11/15/19 0827 11/16/19 0941  AST 41  --   --   ALT 48*  --   --   ALKPHOS 198*  --   --   BILITOT 1.3*  --   --   PROT 6.7  --   --   ALBUMIN 3.3* 3.4* 3.6     Antibiotics: Anti-infectives (From admission, onward)   Start     Dose/Rate Route Frequency Ordered Stop   11/14/19 2300  ceFEPIme (MAXIPIME) 1 g in sodium chloride 0.9 % 100 mL IVPB  Status:  Discontinued        1 g 200 mL/hr over 30 Minutes Intravenous Every 24 hours 11/14/19 0058 11/15/19 0859   11/14/19 1800  vancomycin (VANCOREADY) IVPB 500 mg/100 mL  Status:  Discontinued        500 mg 100 mL/hr over 60 Minutes Intravenous Every T-Th-Sa (Hemodialysis) 11/14/19 1236 11/15/19 0859   11/13/19 2300  vancomycin (VANCOREADY) IVPB 750 mg/150 mL        750 mg 150 mL/hr over 60 Minutes Intravenous  Once 11/13/19 2247 11/14/19 0646   11/13/19 2245  ceFEPIme (MAXIPIME) 1 g in sodium chloride 0.9 % 100 mL IVPB        1 g 200 mL/hr over 30 Minutes Intravenous  Once 11/13/19 2237 11/14/19 0140       DVT prophylaxis: Heparin  Code Status: Full code  Family Communication: No family at bedside    Status is: Inpatient  Dispo: The patient is from: Home              Anticipated d/c is to: Home              Anticipated d/c date is: 11/20/2019              Patient currently not medically stable for discharge  Barrier to discharge-patient having nausea, ongoing hemodialysis, IV antibiotics for pneumonia       Consultants:  Nephrology  Procedures:     Objective   Vitals:   11/16/19 1830 11/16/19 1910 11/16/19 2037 11/17/19 0524  BP: (!) 162/80 (!) 158/81 (!) 147/68 (!) 147/61  Pulse:  79 (!) 103 71  Resp:  18 20 18   Temp:  98.8 F (37.1 C) 99 F (37.2 C) 98.5 F (36.9 C)  TempSrc:  Oral Oral Oral  SpO2:   99% 100%  Weight:      Height:        Intake/Output Summary (Last 24 hours)  at 11/17/2019 0936 Last data filed at 11/16/2019 1830  Gross per 24 hour  Intake 120 ml  Output 3700 ml  Net -3580 ml    09/29 1901 - 10/01 0700 In: 240 [P.O.:240] Out: 3700   Filed Weights   11/13/19 1620 11/16/19 0546 11/16/19 1435  Weight: 44 kg 49.5 kg 46.8 kg    Physical Examination:   General-appears in no acute distress Heart-S1-S2, regular, no murmur auscultated Lungs-clear to auscultation bilaterally, no wheezing or crackles auscultated Abdomen-soft, nontender, no organomegaly Extremities-no edema in the lower extremities Neuro-alert, oriented x3, no focal deficit noted   Data Reviewed:   Recent Results (from the past 240 hour(s))  Respiratory Panel by RT PCR (Flu A&B, Covid) - Nasopharyngeal Swab     Status: None   Collection Time: 11/13/19 10:38 PM   Specimen: Nasopharyngeal Swab  Result Value Ref Range Status   SARS Coronavirus 2 by RT PCR NEGATIVE NEGATIVE Final    Comment: (NOTE) SARS-CoV-2 target nucleic acids are NOT DETECTED.  The SARS-CoV-2 RNA is generally detectable in upper respiratoy specimens during the acute phase of infection. The lowest concentration of SARS-CoV-2 viral copies this assay can detect is 131 copies/mL. A negative result does not preclude SARS-Cov-2 infection and should not be used as the sole basis for treatment or other patient management decisions. A negative result may occur with  improper specimen collection/handling, submission of specimen other than nasopharyngeal swab, presence of viral mutation(s) within the areas targeted by this assay, and inadequate number of viral copies (<131 copies/mL). A negative result must be combined with clinical observations, patient history, and epidemiological information. The expected result is Negative.  Fact Sheet for Patients:  PinkCheek.be  Fact Sheet for Healthcare Providers:  GravelBags.it  This test is no t yet approved  or cleared by the [place] FDA and  has been authorized for detection and/or diagnosis of SARS-CoV-2 by FDA under an Emergency Use Authorization (EUA). This EUA will remain  in effect (meaning this test can be used) for the duration of the COVID-19 declaration under Section 564(b)(1) of the Act, 21 U.S.C. section 360bbb-3(b)(1), unless the authorization is terminated or revoked sooner.     Influenza A by PCR NEGATIVE NEGATIVE Final   Influenza B by PCR NEGATIVE NEGATIVE Final    Comment: (NOTE) The Xpert Xpress SARS-CoV-2/FLU/RSV assay is intended as an aid in  the diagnosis of influenza from Nasopharyngeal swab specimens and  should not be used as a sole basis for treatment. Nasal washings and  aspirates are unacceptable for Xpert Xpress SARS-CoV-2/FLU/RSV  testing.  Fact Sheet for Patients: PinkCheek.be  Fact Sheet for Healthcare Providers: GravelBags.it  This test is not yet approved or cleared by the [place] FDA and  has been authorized for detection and/or diagnosis of SARS-CoV-2 by  FDA under an Emergency Use Authorization (EUA). This EUA will remain  in effect (meaning this test can be used) for the duration of the  Covid-19 declaration under Section 564(b)(1) of the Act, 21  U.S.C. section 360bbb-3(b)(1), unless the authorization is  terminated or revoked. Performed at [facility], [address] [place], [place] [address]   Culture, blood (Routine X 2) w Reflex to ID Panel     Status: None (Preliminary result)   Collection Time: 11/14/19  3:05 AM   Specimen: BLOOD  Result Value Ref Range Status   Specimen Description BLOOD RIGHT ANTECUBITAL  Final   Special Requests   Final    BOTTLES DRAWN AEROBIC AND ANAEROBIC Blood Culture adequate volume   Culture  Final    NO GROWTH 3 DAYS Performed at [facility], [address], [place], [place] [address]    Report Status PENDING  Incomplete  Culture,  blood (Routine X 2) w Reflex to ID Panel     Status: None (Preliminary result)   Collection Time: 11/14/19  5:40 AM   Specimen: BLOOD  Result Value Ref Range Status   Specimen Description BLOOD SITE NOT SPECIFIED  Final   Special Requests   Final    BOTTLES DRAWN AEROBIC AND ANAEROBIC Blood Culture adequate volume   Culture   Final    NO GROWTH 3 DAYS Performed at [facility], [address], [place], [place] [address]    Report Status PENDING  Incomplete       Studies:  DG Chest 2 View  Result Date: 11/15/2019 CLINICAL DATA:  Shortness of breath EXAM: CHEST - 2 VIEW COMPARISON:  11/13/2019 FINDINGS: Cardiac shadow is enlarged but stable. Lungs are well aerated bilaterally. Improved aeration in the left base is noted. Small effusions are seen bilaterally. Mild interstitial edema is noted as well. No bony abnormality is seen. IMPRESSION: Mild interstitial edema. Electronically Signed   By: Inez Catalina M.D.   On: 11/15/2019 10:37    [place]   [facility] If 7PM-7AM, please contact night-coverage at [email or web address], Office  [phone]   11/17/2019, 9:36 AM  LOS: 4 days

## 2019-11-17 NOTE — Plan of Care (Signed)
  Problem: Acute Rehab PT Goals(only PT should resolve) Goal: Patient Will Transfer Sit To/From Stand Outcome: Progressing Flowsheets (Taken 11/17/2019 0857) Patient will transfer sit to/from stand: with min guard assist Goal: Pt Will Transfer Bed To Chair/Chair To Bed Outcome: Progressing Flowsheets (Taken 11/17/2019 0857) Pt will Transfer Bed to Chair/Chair to Bed: with min assist Goal: Pt Will Ambulate Outcome: Progressing Flowsheets (Taken 11/17/2019 0857) Pt will Ambulate:  25 feet  with minimal assist Goal: Pt/caregiver will Perform Home Exercise Program Outcome: Progressing Flowsheets (Taken 11/17/2019 0857) Pt/caregiver will Perform Home Exercise Program:  For increased strengthening  For improved balance  Independently  8:58 AM, 11/17/19 Mearl Latin PT, DPT Physical Therapist at [facility]

## 2019-11-17 NOTE — Progress Notes (Signed)
Son called to check on pt during night. Advised of what she ate for dinner, CBG 234 and that she received novolog. He was concerned of this, states that she is real sensitive to insulin. Advised I would monitor closely for s/s and re-check around 3am. Blood sugar 123 at this time. Pt awake, alert and watching tv.

## 2019-11-18 LAB — RENAL FUNCTION PANEL
Albumin: 3.2 g/dL — ABNORMAL LOW (ref 3.5–5.0)
Anion gap: 10 (ref 5–15)
BUN: 17 mg/dL (ref 8–23)
CO2: 26 mmol/L (ref 22–32)
Calcium: 7.9 mg/dL — ABNORMAL LOW (ref 8.9–10.3)
Chloride: 96 mmol/L — ABNORMAL LOW (ref 98–111)
Creatinine, Ser: 3.44 mg/dL — ABNORMAL HIGH (ref 0.44–1.00)
GFR calc Af Amer: 15 mL/min — ABNORMAL LOW (ref >60)
GFR calc non Af Amer: 13 mL/min — ABNORMAL LOW (ref >60)
Glucose, Bld: 154 mg/dL — ABNORMAL HIGH (ref 70–99)
Phosphorus: 2.2 mg/dL — ABNORMAL LOW (ref 2.5–4.6)
Potassium: 3.9 mmol/L (ref 3.5–5.1)
Sodium: 132 mmol/L — ABNORMAL LOW (ref 135–145)

## 2019-11-18 LAB — CBC
HCT: 30.2 % — ABNORMAL LOW (ref 36.0–46.0)
Hemoglobin: 9.8 g/dL — ABNORMAL LOW (ref 12.0–15.0)
MCH: 34.5 pg — ABNORMAL HIGH (ref 26.0–34.0)
MCHC: 32.5 g/dL (ref 30.0–36.0)
MCV: 106.3 fL — ABNORMAL HIGH (ref 80.0–100.0)
Platelets: 160 10*3/uL (ref 150–400)
RBC: 2.84 MIL/uL — ABNORMAL LOW (ref 3.87–5.11)
RDW: 11.9 % (ref 11.5–15.5)
WBC: 5.4 10*3/uL (ref 4.0–10.5)
nRBC: 0 % (ref 0.0–0.2)

## 2019-11-18 LAB — RESPIRATORY PANEL BY RT PCR (FLU A&B, COVID)
Influenza A by PCR: NEGATIVE
Influenza B by PCR: NEGATIVE
SARS Coronavirus 2 by RT PCR: NEGATIVE

## 2019-11-18 LAB — GLUCOSE, CAPILLARY
Glucose-Capillary: 150 mg/dL — ABNORMAL HIGH (ref 70–99)
Glucose-Capillary: 146 mg/dL — ABNORMAL HIGH (ref 70–99)
Glucose-Capillary: 266 mg/dL — ABNORMAL HIGH (ref 70–99)
Glucose-Capillary: 171 mg/dL — ABNORMAL HIGH (ref 70–99)
Glucose-Capillary: 92 mg/dL (ref 70–99)

## 2019-11-18 MED ORDER — FERRIC CITRATE 1 GM 210 MG(FE) PO TABS: 210.0000 mg | ORAL_TABLET | Freq: Three times a day (TID) | ORAL | Status: DC

## 2019-11-18 MED ORDER — FERRIC CITRATE 1 GM 210 MG(FE) PO TABS
210.0000 mg | ORAL_TABLET | Freq: Every day | ORAL | Status: DC
  Administered 2019-11-18: 210 mg via ORAL
  Filled 2019-11-18 (×3): qty 1

## 2019-11-18 NOTE — Progress Notes (Signed)
Nephrology Follow-Up Consult note   Assessment/Recommendations: INIOLUWA [name] is a/an 64 y.o. female with a past medical history significant for ESRD on dialysis at [facility] (TTS), BM, CHF, HTN, seizure disorder, and remote alcohol induced pancreatitis, who presented to the emergency department because of ongoing weakness.   Dialysis Orders: Center: [facility]  on [facility]- last 11/11/2019. EDW 43.5kg, Time: 3.75 hours, 2K/ 2.5 Ca, BFR 400, DFR 600,  Dialyzer: [place] Revacir 200, LUE AVF Heparin 1000 unit load, 500 units/hour (stop 60 min before end of HD)  Assessment/Plan: 1.  ESRD:  [facility]; moved from 10/2 to 10/3 due to patient volume 2.  Hypertension/volume:  acceptable on current regimen; note last post weight of 42 kg per charting  3. Anemia of CKD Aranesp 60 MCG started weekly this admission 4.  Metabolic bone disease: Continue home sensipar and calcitriol.  Will reduce ferric citrate to 1 tab with dinner as pt with hypophosphatemia on current regimen, FTT; may need to adjust outpatient 5. Seizures: history of recent break through. Continue home antiepileptics and f/u w/ neuro 6. Failure to thrive:  Multiple hospitalizations for weakness, nausea, poor appetite.  Greatly appreciate palliative care's involvement.  Likely to follow outpatient as well  dispo - per report for SNF tomorrow; HD Erica AM before discharge  ___________________________________________________________  CC: ESRD  Interval History/Subjective:   Last HD on 9/30 with 3.7 kg removed.  She is to be discharged to a SNF tomorrow per nursing.  Spoke with HD RN and we moved her tx from today to tomorrow AM due to patient volumes. Pt denies any current issues with her graft   Review of systems:  Denies shortness of breath or chest pain  Denies n/v   Medications:  Current Facility-Administered Medications  Medication Dose Route Frequency Provider Last Rate Last Admin  . 0.9 %  sodium chloride infusion    Intravenous PRN Emokpae, Courage, MD      . acetaminophen (TYLENOL) tablet 650 mg  650 mg Oral Q4H PRN Adefeso, Oladapo, DO   650 mg at 11/18/19 1459  . albuterol (VENTOLIN HFA) 108 (90 Base) MCG/ACT inhaler 2 puff  2 puff Inhalation Q4H PRN Adefeso, Oladapo, DO      . calcitRIOL (ROCALTROL) capsule 0.25 mcg  0.25 mcg Oral Q T,Th,Sa-HD Adefeso, Oladapo, DO   0.25 mcg at 11/18/19 1144  . carvedilol (COREG) tablet 6.25 mg  6.25 mg Oral BID Adefeso, Oladapo, DO   6.25 mg at 11/18/19 1145  . Chlorhexidine Gluconate Cloth 2 % PADS 6 each  6 each Topical Q0600 Reesa Chew, MD   6 each at 11/18/19 [phone]  . cinacalcet (SENSIPAR) tablet 30 mg  30 mg Oral QPM Adefeso, Oladapo, DO   30 mg at 11/17/19 1730  . Darbepoetin Alfa (ARANESP) injection 60 mcg  60 mcg Intravenous Q Tue-HD Reesa Chew, MD   60 mcg at 11/14/19 1748  . dextromethorphan-guaiFENesin (MUCINEX DM) 30-600 MG per 12 hr tablet 1 tablet  1 tablet Oral BID Adefeso, Oladapo, DO   1 tablet at 11/18/19 1144  . feeding supplement (NEPRO CARB STEADY) liquid 237 mL  237 mL Oral BID BM Emokpae, Courage, MD   237 mL at 11/18/19 1443  . ferric citrate (AURYXIA) tablet 420 mg  420 mg Oral TID WC Adefeso, Oladapo, DO   420 mg at 11/18/19 1143  . folic acid (FOLVITE) tablet 1 mg  1 mg Oral q morning - 10a Adefeso, Oladapo, DO  1 mg at 11/18/19 1145  . heparin injection 1,000 Units  1,000 Units Dialysis PRN Reesa Chew, MD      . heparin injection 5,000 Units  5,000 Units Subcutaneous Q8H Adefeso, Oladapo, DO   5,000 Units at 11/18/19 1502  . hydrALAZINE (APRESOLINE) tablet 25 mg  25 mg Oral Q8H Oswald Hillock, MD   25 mg at 11/18/19 1500  . influenza vac split quadrivalent PF (FLUARIX) injection 0.5 mL  0.5 mL Intramuscular Tomorrow-1000 Emokpae, Courage, MD      . insulin aspart (novoLOG) injection 0-5 Units  0-5 Units Subcutaneous QHS Adefeso, Oladapo, DO   2 Units at 11/16/19 2236  . insulin aspart (novoLOG) injection 0-6 Units  0-6 Units  Subcutaneous TID WC Adefeso, Oladapo, DO   3 Units at 11/18/19 1224  . levETIRAcetam (KEPPRA) tablet 1,000 mg  1,000 mg Oral q morning - 10a Adefeso, Oladapo, DO   1,000 mg at 11/18/19 1145  . loperamide (IMODIUM) capsule 4 mg  4 mg Oral Daily PRN Adefeso, Oladapo, DO      . losartan (COZAAR) tablet 25 mg  25 mg Oral Daily Reesa Chew, MD   25 mg at 11/18/19 1145  . metoCLOPramide (REGLAN) tablet 5 mg  5 mg Oral TID AC & HS Oswald Hillock, MD   5 mg at 11/18/19 1146  . NIFEdipine (PROCARDIA-XL/NIFEDICAL-XL) 24 hr tablet 30 mg  30 mg Oral q morning - 10a Adefeso, Oladapo, DO   30 mg at 11/18/19 1146  . ondansetron (ZOFRAN) tablet 4 mg  4 mg Oral Q6H PRN Adefeso, Oladapo, DO   4 mg at 11/15/19 0857  . pantoprazole (PROTONIX) injection 40 mg  40 mg Intravenous Q12H Adefeso, Oladapo, DO   40 mg at 11/18/19 1147  . phenytoin (DILANTIN) chewable tablet 300 mg  300 mg Oral Daily Adefeso, Oladapo, DO   300 mg at 11/18/19 1144  . pneumococcal 23 valent vaccine (PNEUMOVAX-23) injection 0.5 mL  0.5 mL Intramuscular Tomorrow-1000 Emokpae, Courage, MD      . polyethylene glycol (MIRALAX / GLYCOLAX) packet 17 g  17 g Oral Daily Adefeso, Oladapo, DO   17 g at 11/16/19 1101  . polyvinyl alcohol (LIQUIFILM TEARS) 1.4 % ophthalmic solution 1 drop  1 drop Both Eyes PRN Emokpae, Courage, MD      . rOPINIRole (REQUIP) tablet 1 mg  1 mg Oral TID Adefeso, Oladapo, DO   1 mg at 11/18/19 1146  . topiramate (TOPAMAX) tablet 25 mg  25 mg Oral BID Adefeso, Oladapo, DO   25 mg at 11/18/19 1145  . Vitamin D (Ergocalciferol) (DRISDOL) capsule 50,000 Units  50,000 Units Oral Q Woody Seller, Oladapo, DO   50,000 Units at 11/17/19 1049     Physical Exam: Vitals:   11/18/19 0535 11/18/19 1300  BP: (!) 155/64 (!) 151/79  Pulse: 74 77  Resp: 16 18  Temp: 98.7 F (37.1 C) 98.2 F (36.8 C)  SpO2: 100% 100%   Total I/O Erica: 360 [P.O.:360] Out: -   Intake/Output Summary (Last 24 hours) at 11/18/2019 1709 Last data filed  at 11/18/2019 1300 Gross per 24 hour  Intake 600 ml  Output --  Net 600 ml   GEN: elderly female Erica bed Erica NAD ENT: no nasal discharge, mmm EYES: no scleral icterus, eomi CV: S1S2 no rub PULM: clear anteriorly and unlabored on room air  ABD: soft/nt non-distended EXT: no edema, warm and well perfused Dialysis Access: LUE AVG + bruit and  no edema   Lab Results  Component Value Date   NA 132 (L) 11/18/2019   K 3.9 11/18/2019   CL 96 (L) 11/18/2019   CO2 26 11/18/2019   BUN 17 11/18/2019   CREATININE 3.44 (H) 11/18/2019   CALCIUM 7.9 (L) 11/18/2019   ALBUMIN 3.2 (L) 11/18/2019   PHOS 2.2 (L) 11/18/2019   Claudia Desanctis, MD 5:13 PM 11/18/2019

## 2019-11-18 NOTE — Progress Notes (Signed)
Received call from telemetry at approximately 12:45 stating that patient's p wave has changed. I assessed strip and then notified Dr. Darrick Meigs. He ordered and EKG and reviewed it. No further instructions at this time. Patient asymptomatic.

## 2019-11-18 NOTE — Progress Notes (Signed)
[facility]  PROGRESS NOTE  Erica [name] YTK:[id number] DOB: 12-09-1955 DOA: 11/13/2019 PCP: [name], NP   Brief HPI:   64 year old female with history of ESRD on hemodialysis TTS, congestive heart failure, hypertension, seizure disorder, remote alcohol induced pancreatitis was admitted on 11/13/2019 due to fatigue, malaise, cough and hyperkalemia.    Subjective   Patient seen and examined, denies any complaints.  Nausea vomiting has improved with Reglan.   Assessment/Plan:     1. Hyperkalemia-resolved, patient presents with hyperkalemia, received calcium gluconate, insulin.  Underwent hemodialysis yesterday.  This morning potassium is 4.8. 2. Recurrent nausea-?  Gastroparesis, patient has had history of recurrent nausea, she does not have IV access at this time.  Improved after she was started on Reglan 5 mg 4 times a day.  Continue as needed Zofran.   3. Pneumonia-patient was empirically started on vancomycin and cefepime for?  Pneumonia.  Chest x-ray  showed mild interstitial edema.  She continues to be afebrile, procalcitonin 0.38, WBC 7.2, currently O2 sats 94% room air.   IV antibiotics were discontinued.   4. HFpEF-patient has history of contrast leak CHF, chest x-ray shows mild interstitial edema.  Hemodialysis as per nephrology. 5. Diabetes mellitus type 2-continue sliding scale insulin with NovoLog. 6. Hypertension-continue nifedipine, Coreg, hydralazine.  Blood pressure is well controlled. 7. History of seizure disorder-continue Dilantin, Topamax, Keppra.  Goals of care-palliative care consult was obtained, patient is full code.  Full scope of care and outpatient palliative care referral.     COVID-19 Labs  No results for input(s): DDIMER, FERRITIN, LDH, CRP in the last 72 hours.  Lab Results  Component Value Date   SARSCOV2NAA NEGATIVE 11/13/2019   SARSCOV2NAA NEGATIVE 10/25/2019   [place] NEGATIVE 09/27/2019   [place] NEGATIVE 04/20/2019      Scheduled medications:   . calcitRIOL  0.25 mcg Oral Q T,Th,Sa-HD  . carvedilol  6.25 mg Oral BID  . Chlorhexidine Gluconate Cloth  6 each Topical Q0600  . cinacalcet  30 mg Oral QPM  . darbepoetin (ARANESP) injection - DIALYSIS  60 mcg Intravenous Q Tue-HD  . dextromethorphan-guaiFENesin  1 tablet Oral BID  . feeding supplement (NEPRO CARB STEADY)  237 mL Oral BID BM  . ferric citrate  420 mg Oral TID WC  . folic acid  1 mg Oral q morning - 10a  . heparin injection (subcutaneous)  5,000 Units Subcutaneous Q8H  . hydrALAZINE  25 mg Oral Q8H  . influenza vac split quadrivalent PF  0.5 mL Intramuscular Tomorrow-1000  . insulin aspart  0-5 Units Subcutaneous QHS  . insulin aspart  0-6 Units Subcutaneous TID WC  . levETIRAcetam  1,000 mg Oral q morning - 10a  . losartan  25 mg Oral Daily  . metoCLOPramide  5 mg Oral TID AC & HS  . NIFEdipine  30 mg Oral q morning - 10a  . pantoprazole  40 mg Intravenous Q12H  . phenytoin  300 mg Oral Daily  . pneumococcal 23 valent vaccine  0.5 mL Intramuscular Tomorrow-1000  . polyethylene glycol  17 g Oral Daily  . rOPINIRole  1 mg Oral TID  . topiramate  25 mg Oral BID  . Vitamin D (Ergocalciferol)  50,000 Units Oral Q Fri         CBG: Recent Labs  Lab 11/17/19 1622 11/17/19 2038 11/18/19 0148 11/18/19 0750 11/18/19 1124  GLUCAP 72 160* 150* 146* 266*    SpO2: 100 % O2 Flow Rate (L/min): 2 L/min  CBC: Recent Labs  Lab 11/13/19 1743 11/14/19 0540 11/16/19 0941 11/18/19 0742  WBC 8.7 7.2 5.2 5.4  HGB 10.5* 8.3* 9.4* 9.8*  HCT 34.8* 27.3* 29.6* 30.2*  MCV 113.4* 112.3* 108.0* 106.3*  PLT 172 164 152 478    Basic Metabolic Panel: Recent Labs  Lab 11/14/19 0540 11/15/19 0827 11/16/19 0941 11/17/19 0853 11/18/19 0742  NA 134* 133* 132*  132* 132* 132*  K 6.2* 4.8 5.3*  5.4* 3.4* 3.9  CL 98 95* 95*  94* 93* 96*  CO2 25 26 25  25 28 26   GLUCOSE 94 108* 89  88 189* 154*  BUN 40* 19 25*  25* 12 17   CREATININE 4.48* 2.83* 3.68*  3.74* 2.46* 3.44*  CALCIUM 8.4* 8.2* 8.3*  8.2* 8.5* 7.9*  MG 2.6*  --   --   --   --   PHOS 4.0 3.2 3.5  --  2.2*     Liver Function Tests: Recent Labs  Lab 11/14/19 0540 11/15/19 0827 11/16/19 0941 11/18/19 0742  AST 41  --   --   --   ALT 48*  --   --   --   ALKPHOS 198*  --   --   --   BILITOT 1.3*  --   --   --   PROT 6.7  --   --   --   ALBUMIN 3.3* 3.4* 3.6 3.2*     Antibiotics: Anti-infectives (From admission, onward)   Start     Dose/Rate Route Frequency Ordered Stop   11/14/19 2300  ceFEPIme (MAXIPIME) 1 g in sodium chloride 0.9 % 100 mL IVPB  Status:  Discontinued        1 g 200 mL/hr over 30 Minutes Intravenous Every 24 hours 11/14/19 0058 11/15/19 0859   11/14/19 1800  vancomycin (VANCOREADY) IVPB 500 mg/100 mL  Status:  Discontinued        500 mg 100 mL/hr over 60 Minutes Intravenous Every T-Th-Sa (Hemodialysis) 11/14/19 1236 11/15/19 0859   11/13/19 2300  vancomycin (VANCOREADY) IVPB 750 mg/150 mL        750 mg 150 mL/hr over 60 Minutes Intravenous  Once 11/13/19 2247 11/14/19 0646   11/13/19 2245  ceFEPIme (MAXIPIME) 1 g in sodium chloride 0.9 % 100 mL IVPB        1 g 200 mL/hr over 30 Minutes Intravenous  Once 11/13/19 2237 11/14/19 0140       DVT prophylaxis: Heparin  Code Status: Full code  Family Communication: No family at bedside    Status is: Inpatient  Dispo: The patient is from: Home              Anticipated d/c is to: Home              Anticipated d/c date is: 11/20/2019              Patient currently not medically stable for discharge  Barrier to discharge-patient having nausea, ongoing hemodialysis, IV antibiotics for pneumonia       Consultants:  Nephrology  Procedures:     Objective   Vitals:   11/17/19 2040 11/18/19 0535 11/18/19 0537 11/18/19 1300  BP: (!) 176/76 (!) 155/64  (!) 151/79  Pulse: 79 74  77  Resp: 17 16  18   Temp: 98.7 F (37.1 C) 98.7 F (37.1 C)  98.2 F  (36.8 C)  TempSrc: Oral Axillary  Oral  SpO2: 100% 100%  100%  Weight:  42 kg   Height:        Intake/Output Summary (Last 24 hours) at 11/18/2019 1511 Last data filed at 11/18/2019 1300 Gross per 24 hour  Intake 600 ml  Output --  Net 600 ml    09/30 1901 - 10/02 0700 In: 600 [P.O.:600] Out: -   Filed Weights   11/16/19 0546 11/16/19 1435 11/18/19 0537  Weight: 49.5 kg 46.8 kg 42 kg    Physical Examination:   General-appears in no acute distress Heart-S1-S2, regular, no murmur auscultated Lungs-clear to auscultation bilaterally, no wheezing or crackles auscultated Abdomen-soft, nontender, no organomegaly Extremities-no edema in the lower extremities Neuro-alert, oriented x3, no focal deficit noted   Data Reviewed:   Recent Results (from the past 240 hour(s))  Respiratory Panel by RT PCR (Flu A&B, Covid) - Nasopharyngeal Swab     Status: None   Collection Time: 11/13/19 10:38 PM   Specimen: Nasopharyngeal Swab  Result Value Ref Range Status   SARS Coronavirus 2 by RT PCR NEGATIVE NEGATIVE Final    Comment: (NOTE) SARS-CoV-2 target nucleic acids are NOT DETECTED.  The SARS-CoV-2 RNA is generally detectable in upper respiratoy specimens during the acute phase of infection. The lowest concentration of SARS-CoV-2 viral copies this assay can detect is 131 copies/mL. A negative result does not preclude SARS-Cov-2 infection and should not be used as the sole basis for treatment or other patient management decisions. A negative result may occur with  improper specimen collection/handling, submission of specimen other than nasopharyngeal swab, presence of viral mutation(s) within the areas targeted by this assay, and inadequate number of viral copies (<131 copies/mL). A negative result must be combined with clinical observations, patient history, and epidemiological information. The expected result is Negative.  Fact Sheet for Patients:   PinkCheek.be  Fact Sheet for Healthcare Providers:  GravelBags.it  This test is no t yet approved or cleared by the [place] FDA and  has been authorized for detection and/or diagnosis of SARS-CoV-2 by FDA under an Emergency Use Authorization (EUA). This EUA will remain  in effect (meaning this test can be used) for the duration of the COVID-19 declaration under Section 564(b)(1) of the Act, 21 U.S.C. section 360bbb-3(b)(1), unless the authorization is terminated or revoked sooner.     Influenza A by PCR NEGATIVE NEGATIVE Final   Influenza B by PCR NEGATIVE NEGATIVE Final    Comment: (NOTE) The Xpert Xpress SARS-CoV-2/FLU/RSV assay is intended as an aid in  the diagnosis of influenza from Nasopharyngeal swab specimens and  should not be used as a sole basis for treatment. Nasal washings and  aspirates are unacceptable for Xpert Xpress SARS-CoV-2/FLU/RSV  testing.  Fact Sheet for Patients: PinkCheek.be  Fact Sheet for Healthcare Providers: GravelBags.it  This test is not yet approved or cleared by the [place] FDA and  has been authorized for detection and/or diagnosis of SARS-CoV-2 by  FDA under an Emergency Use Authorization (EUA). This EUA will remain  in effect (meaning this test can be used) for the duration of the  Covid-19 declaration under Section 564(b)(1) of the Act, 21  U.S.C. section 360bbb-3(b)(1), unless the authorization is  terminated or revoked. Performed at [facility], [address], [place], [place] [address]   Culture, blood (Routine X 2) w Reflex to ID Panel     Status: None (Preliminary result)   Collection Time: 11/14/19  3:05 AM   Specimen: BLOOD  Result Value Ref Range Status   Specimen Description BLOOD RIGHT ANTECUBITAL  Final   Special Requests   Final    BOTTLES DRAWN AEROBIC AND ANAEROBIC Blood Culture adequate volume    Culture   Final    NO GROWTH 4 DAYS Performed at [facility], [address], [place], [place] [address]    Report Status PENDING  Incomplete  Culture, blood (Routine X 2) w Reflex to ID Panel     Status: None (Preliminary result)   Collection Time: 11/14/19  5:40 AM   Specimen: BLOOD  Result Value Ref Range Status   Specimen Description BLOOD SITE NOT SPECIFIED  Final   Special Requests   Final    BOTTLES DRAWN AEROBIC AND ANAEROBIC Blood Culture adequate volume   Culture   Final    NO GROWTH 4 DAYS Performed at [facility], [address] [place], [place] [address]    Report Status PENDING  Incomplete       Studies:  No results found.  Oswald Hillock   [facility] If 7PM-7AM, please contact night-coverage at [email or web address], Office  [phone]   11/18/2019, 3:11 PM  LOS: 5 days

## 2019-11-18 NOTE — Procedures (Signed)
     NEPHROLOGY NURSING NOTE:  After discussion with Dr. Royce Macadamia, pt's dialysis treatment has been rescheduled to tomorrow owing to high HD patient census.  Rockwell Alexandria, RN

## 2019-11-18 NOTE — TOC Progression Note (Signed)
Transition of Care [facility]) - Progression Note    Patient Details  Name: KETINA MARS MRN: [id number] Date of Birth: 1955-09-23  [facility]) CM/SW Contact  Natasha Bence, LCSW Phone Number: 11/18/2019, 1:09 PM  Clinical Narrative:    Patient's family agreeable to [facility] bed offer. Patient's dialysis treatment was moved to Sunday 11/19/19. Patient not able to discharge until dialysis treatment. CSW confirmed patient's Covid vaccination with [facility]. Melissa with [facility] agreeable to take patient on 11/19/19. TOC to follow   Expected Discharge Plan: Home/Self Care Barriers to Discharge: Continued Medical Work up  Expected Discharge Plan and Services Expected Discharge Plan: Home/Self Care In-house Referral: Clinical Social Work     Living arrangements for the past 2 months: Apartment                                       Social Determinants of Health (SDOH) Interventions    Readmission Risk Interventions Readmission Risk Prevention Plan 11/15/2019 11/14/2019  Transportation Screening - Complete  Medication Review Press photographer) - Complete  PCP or Specialist appointment within 3-5 days of discharge Complete -  [place] or [place] - Complete  SW Recovery Care/Counseling Consult - Complete  [place] - Not Applicable  Some recent data might be hidden

## 2019-11-19 ENCOUNTER — Encounter (HOSPITAL_COMMUNITY): Payer: Self-pay

## 2019-11-19 ENCOUNTER — Emergency Department (HOSPITAL_COMMUNITY)
Admission: EM | Admit: 2019-11-19 | Discharge: 2019-11-20 | Disposition: A | Discharge status: Home / Self Care | Payer: Medicare Other | Attending: Emergency Medicine | Admitting: Emergency Medicine

## 2019-11-19 DIAGNOSIS — Z79899 Other long term (current) drug therapy: Secondary | ICD-10-CM | POA: Insufficient documentation

## 2019-11-19 DIAGNOSIS — I132 Hypertensive heart and chronic kidney disease with heart failure and with stage 5 chronic kidney disease, or end stage renal disease: Secondary | ICD-10-CM | POA: Diagnosis not present

## 2019-11-19 DIAGNOSIS — N186 End stage renal disease: Secondary | ICD-10-CM | POA: Insufficient documentation

## 2019-11-19 DIAGNOSIS — I10 Essential (primary) hypertension: Secondary | ICD-10-CM

## 2019-11-19 DIAGNOSIS — I5030 Unspecified diastolic (congestive) heart failure: Secondary | ICD-10-CM | POA: Insufficient documentation

## 2019-11-19 DIAGNOSIS — Z794 Long term (current) use of insulin: Secondary | ICD-10-CM | POA: Insufficient documentation

## 2019-11-19 DIAGNOSIS — F17200 Nicotine dependence, unspecified, uncomplicated: Secondary | ICD-10-CM | POA: Insufficient documentation

## 2019-11-19 DIAGNOSIS — E1165 Type 2 diabetes mellitus with hyperglycemia: Secondary | ICD-10-CM | POA: Insufficient documentation

## 2019-11-19 LAB — CULTURE, BLOOD (ROUTINE X 2)
Culture: NO GROWTH
Culture: NO GROWTH
Special Requests: ADEQUATE
Special Requests: ADEQUATE

## 2019-11-19 LAB — GLUCOSE, CAPILLARY
Glucose-Capillary: 183 mg/dL — ABNORMAL HIGH (ref 70–99)
Glucose-Capillary: 197 mg/dL — ABNORMAL HIGH (ref 70–99)

## 2019-11-19 MED ORDER — PANTOPRAZOLE SODIUM 40 MG PO TBEC: 40.0000 mg | DELAYED_RELEASE_TABLET | Freq: Every day | ORAL | 1 refills | Status: AC

## 2019-11-19 MED ORDER — NIFEDIPINE ER OSMOTIC RELEASE 30 MG PO TB24: 60.0000 mg | ORAL_TABLET | Freq: Every morning | ORAL | Status: DC

## 2019-11-19 MED ORDER — NIFEDIPINE ER 60 MG PO TB24: 60.0000 mg | ORAL_TABLET | Freq: Every morning | ORAL | Status: DC

## 2019-11-19 MED ORDER — METOCLOPRAMIDE HCL 5 MG PO TABS: 5.0000 mg | ORAL_TABLET | Freq: Four times a day (QID) | ORAL | Status: DC | PRN

## 2019-11-19 MED ORDER — LOSARTAN POTASSIUM 25 MG PO TABS: 25.0000 mg | ORAL_TABLET | Freq: Every day | ORAL | Status: DC

## 2019-11-19 MED ORDER — HYDRALAZINE HCL 25 MG PO TABS: 25.0000 mg | ORAL_TABLET | Freq: Three times a day (TID) | ORAL | Status: DC

## 2019-11-19 NOTE — ED Triage Notes (Signed)
Pt discharged from 300 and when EMS got in truck with pt and checked 191/89 which is to high for them to transport . ED nurse rechecked and got 189/74. Pt to be checked back in ED

## 2019-11-19 NOTE — Procedures (Signed)
     HEMODIALYSIS TREATMENT NOTE:  3.75 hour low-heparin HD completed via LUE loop AVG (15g/antegrade flow).  Goal met: 3 liters removed without interruption in UF.  All blood was returned and hemostasis was achieved in 15 minutes.  At EDW.  No changes from pre-dialysis assessment.   Rockwell Alexandria, RN

## 2019-11-19 NOTE — Discharge Summary (Signed)
Physician Discharge Summary  KIMBER FRITTS TIW:[id number] DOB: 1955/02/18 DOA: 11/13/2019  PCP: [name], NP  Admit date: 11/13/2019 Discharge date: 11/19/2019  Time spent: 50 minutes  Recommendations for Outpatient Follow-up:  1. Patient to be discharged to skilled nursing facility for rehab  2. Patient will need outpatient palliative care follow-up  Discharge Diagnoses:  Principal Problem:   Hyperkalemia Active Problems:   Nausea & vomiting   Dehydration   Tobacco abuse   Chronic diastolic congestive heart failure (HCC)   Stroke (HCC)   Seizures (HCC)   Diabetes mellitus type 2 in nonobese (HCC)   ESRD (end stage renal disease) (HCC)   Generalized weakness   HCAP (healthcare-associated pneumonia)   Palliative care by specialist   Discharge Condition: Stable  Diet recommendation: Carb modified diet  Filed Weights   11/16/19 0546 11/16/19 1435 11/18/19 0537  Weight: 49.5 kg 46.8 kg 42 kg    History of present illness:  64 year old female with history of ESRD on hemodialysis TTS, congestive heart failure, hypertension, seizure disorder, remote alcohol induced pancreatitis was admitted on 11/13/2019 due to fatigue, malaise, cough and hyperkalemia.   Hospital Course:   1. Hyperkalemia-resolved, patient presents with hyperkalemia, received calcium gluconate, insulin.  Underwent hemodialysis yesterday.  This morning potassium is 4.8. 2. Recurrent nausea-?  Gastroparesis, patient has had history of recurrent nausea, she does not have IV access at this time.  Improved after she was started on Reglan 5 mg 4 times a day.  Will discharge on Reglan 5 mg every 6 hours as needed for refractory nausea and vomiting. 3. Pneumonia-patient was empirically started on vancomycin and cefepime for?  Pneumonia.  Chest x-ray  showed mild interstitial edema.  She continues to be afebrile, procalcitonin 0.38, WBC 7.2, currently O2 sats 94% room air.   IV antibiotics were discontinued.    4. HFpEF-patient has history of contrast leak CHF, chest x-ray shows mild interstitial edema.  Hemodialysis as per nephrology. 5. Diabetes mellitus type 2-continue sliding scale insulin with NovoLog. 6. Hypertension-continue nifedipine, Coreg, losartan, hydralazine.  Blood pressure is still mildly elevated.  Will change the dose of nifedipine XL to 60 mg daily. 7. History of seizure disorder-continue Dilantin, Topamax, Keppra.  Goals of care-palliative care consult was obtained, patient is full code.  Full scope of care and outpatient palliative care referral.      Procedures:    Consultations: Nephrology  Discharge Exam: Vitals:   11/19/19 0900 11/19/19 1100  BP: (!) 178/89 (!) 173/66  Pulse:  66  Resp:    Temp:    SpO2:      General: Appears in no acute distress Cardiovascular: S1-S2, regular, no murmur auscultated Respiratory: Clear to auscultation bilaterally  Discharge Instructions   Discharge Instructions    Diet - low sodium heart healthy   Complete by: As directed    Increase activity slowly   Complete by: As directed      Allergies as of 11/19/2019      Reactions   Aspirin Palpitations   Listed on [facility] 11/12/18      Medication List    STOP taking these medications   ondansetron 4 MG disintegrating tablet Commonly known as: Zofran ODT   ondansetron 4 MG tablet Commonly known as: ZOFRAN   Oxycodone HCl 10 MG Tabs     TAKE these medications   acetaminophen 325 MG tablet Commonly known as: TYLENOL Take 650 mg by mouth every 4 (four) hours as needed for moderate pain.  Artificial Tears 1 % ophthalmic solution Generic drug: carboxymethylcellulose Place 1 drop into both eyes daily as needed (dry eyes).   calcitRIOL 0.25 MCG capsule Commonly known as: ROCALTROL Take 1 capsule (0.25 mcg total) by mouth Every Tuesday,Thursday,and Saturday with dialysis.   carvedilol 6.25 MG tablet Commonly known as: COREG Take 1 tablet (6.25 mg total) by mouth  2 (two) times daily.   cetaphil cream Apply 1 application topically every 12 (twelve) hours as needed (dry skin).   cinacalcet 30 MG tablet Commonly known as: SENSIPAR Take 30 mg by mouth every evening.   dextrose 40 % Gel Commonly known as: GLUTOSE Take 1 Tube by mouth every 10 (ten) minutes as needed for low blood sugar (unitl blood sugar is above 70).   ferric citrate 1 GM 210 MG(Fe) tablet Commonly known as: AURYXIA Take 2 tablets (420 mg total) by mouth 3 (three) times daily with meals.   folic acid 1 MG tablet Commonly known as: FOLVITE Take 1 mg by mouth every morning.   hydrALAZINE 25 MG tablet Commonly known as: APRESOLINE Take 1 tablet (25 mg total) by mouth every 8 (eight) hours.   insulin lispro 100 UNIT/ML injection Commonly known as: HUMALOG Inject 2-10 Units into the skin 4 (four) times daily. Per sliding scale: CBG 151-200 2 units, 201-250 4 units, 251-300 6 units, 301-350 8 units, 351-400 10 units,   levETIRAcetam 1000 MG tablet Commonly known as: KEPPRA Take 1,000 mg by mouth every morning.   loperamide 2 MG capsule Commonly known as: IMODIUM Take 4 mg by mouth daily as needed for diarrhea or loose stools.   losartan 25 MG tablet Commonly known as: COZAAR Take 1 tablet (25 mg total) by mouth daily. Start taking on: November 20, 2019   metoCLOPramide 5 MG tablet Commonly known as: REGLAN Take 1 tablet (5 mg total) by mouth every 6 (six) hours as needed for nausea, vomiting or refractory nausea / vomiting.   NIFEdipine 60 MG 24 hr tablet Commonly known as: ADALAT CC Take 1 tablet (60 mg total) by mouth every morning. Start taking on: November 20, 2019 What changed:   medication strength  how much to take   pantoprazole 40 MG tablet Commonly known as: Protonix Take 1 tablet (40 mg total) by mouth daily.   phenytoin 50 MG tablet Commonly known as: Dilantin Infatabs Chew 6 tablets (300 mg total) by mouth daily.   polyethylene glycol 17 g  packet Commonly known as: MiraLax Take 17 g by mouth daily.   ProAir HFA 108 (90 Base) MCG/ACT inhaler Generic drug: albuterol Inhale 2 puffs into the lungs every 4 (four) hours as needed for wheezing or shortness of breath.   rOPINIRole 1 MG tablet Commonly known as: REQUIP Take 1 mg by mouth 3 (three) times daily.   senna 8.6 MG Tabs tablet Commonly known as: SENOKOT Take 8.6 mg by mouth in the morning and at bedtime.   topiramate 25 MG tablet Commonly known as: TOPAMAX Take 1 tablet (25 mg total) by mouth 2 (two) times daily.   Vitamin D (Ergocalciferol) 1.25 MG (50000 UNIT) Caps capsule Commonly known as: DRISDOL Take 50,000 Units by mouth every Friday.      Allergies  Allergen Reactions  . Aspirin Palpitations    Listed on [facility] 11/12/18    Follow-up Information    [name], NP. Go on 11/24/2019.   Specialty: Internal Medicine Why: 9:30 appointment Contact information: [address] [place] [address]  [phone]                The results of significant diagnostics from this hospitalization (including imaging, microbiology, ancillary and laboratory) are listed below for reference.    Significant Diagnostic Studies: DG Chest 2 View  Result Date: 11/15/2019 CLINICAL DATA:  Shortness of breath EXAM: CHEST - 2 VIEW COMPARISON:  11/13/2019 FINDINGS: Cardiac shadow is enlarged but stable. Lungs are well aerated bilaterally. Improved aeration in the left base is noted. Small effusions are seen bilaterally. Mild interstitial edema is noted as well. No bony abnormality is seen. IMPRESSION: Mild interstitial edema. Electronically Signed   By: Inez Catalina M.D.   On: 11/15/2019 10:37   DG Chest 2 View  Result Date: 10/24/2019 CLINICAL DATA:  Shortness of breath and nausea. EXAM: CHEST - 2 VIEW COMPARISON:  09/27/2019 FINDINGS: Stable chronic cardiomegaly. Unchanged mediastinal contours. No pulmonary edema or acute airspace disease. No pleural effusion or  pneumothorax. There are stable chronic compression deformities in the lower thoracic and upper lumbar spine. No acute osseous abnormalities are seen. IMPRESSION: Stable chronic cardiomegaly without acute abnormality. Electronically Signed   By: Keith Rake M.D.   On: 10/24/2019 17:21   DG Chest Port 1 View  Result Date: 11/13/2019 CLINICAL DATA:  Weakness and diarrhea x2 days. EXAM: PORTABLE CHEST 1 VIEW COMPARISON:  October 24, 2019 FINDINGS: Mild diffuse chronic appearing increased lung markings are seen. Mild atelectasis and/or infiltrate is noted within the left lung base. There is a small left pleural effusion. No pneumothorax is identified. The cardiac silhouette is mildly enlarged and unchanged in size. Radiopaque fixation pins are seen within the right humeral head. IMPRESSION: 1. Mild left basilar atelectasis and/or infiltrate. 2. Small left pleural effusion. Electronically Signed   By: Virgina Norfolk M.D.   On: 11/13/2019 22:21    Microbiology: Recent Results (from the past 240 hour(s))  Respiratory Panel by RT PCR (Flu A&B, Covid) - Nasopharyngeal Swab     Status: None   Collection Time: 11/13/19 10:38 PM   Specimen: Nasopharyngeal Swab  Result Value Ref Range Status   SARS Coronavirus 2 by RT PCR NEGATIVE NEGATIVE Final    Comment: (NOTE) SARS-CoV-2 target nucleic acids are NOT DETECTED.  The SARS-CoV-2 RNA is generally detectable in upper respiratoy specimens during the acute phase of infection. The lowest concentration of SARS-CoV-2 viral copies this assay can detect is 131 copies/mL. A negative result does not preclude SARS-Cov-2 infection and should not be used as the sole basis for treatment or other patient management decisions. A negative result may occur with  improper specimen collection/handling, submission of specimen other than nasopharyngeal swab, presence of viral mutation(s) within the areas targeted by this assay, and inadequate number of viral  copies (<131 copies/mL). A negative result must be combined with clinical observations, patient history, and epidemiological information. The expected result is Negative.  Fact Sheet for Patients:  PinkCheek.be  Fact Sheet for Healthcare Providers:  GravelBags.it  This test is no t yet approved or cleared by the [place] FDA and  has been authorized for detection and/or diagnosis of SARS-CoV-2 by FDA under an Emergency Use Authorization (EUA). This EUA will remain  in effect (meaning this test can be used) for the duration of the COVID-19 declaration under Section 564(b)(1) of the Act, 21 U.S.C. section 360bbb-3(b)(1), unless the authorization is terminated or revoked sooner.     Influenza A by PCR NEGATIVE NEGATIVE Final   Influenza B by PCR NEGATIVE NEGATIVE  Final    Comment: (NOTE) The Xpert Xpress SARS-CoV-2/FLU/RSV assay is intended as an aid in  the diagnosis of influenza from Nasopharyngeal swab specimens and  should not be used as a sole basis for treatment. Nasal washings and  aspirates are unacceptable for Xpert Xpress SARS-CoV-2/FLU/RSV  testing.  Fact Sheet for Patients: PinkCheek.be  Fact Sheet for Healthcare Providers: GravelBags.it  This test is not yet approved or cleared by the [place] FDA and  has been authorized for detection and/or diagnosis of SARS-CoV-2 by  FDA under an Emergency Use Authorization (EUA). This EUA will remain  in effect (meaning this test can be used) for the duration of the  Covid-19 declaration under Section 564(b)(1) of the Act, 21  U.S.C. section 360bbb-3(b)(1), unless the authorization is  terminated or revoked. Performed at [facility], [address] [place], [place] [address]   Culture, blood (Routine X 2) w Reflex to ID Panel     Status: None   Collection Time: 11/14/19  3:05 AM   Specimen: BLOOD   Result Value Ref Range Status   Specimen Description BLOOD RIGHT ANTECUBITAL  Final   Special Requests   Final    BOTTLES DRAWN AEROBIC AND ANAEROBIC Blood Culture adequate volume   Culture   Final    NO GROWTH 5 DAYS Performed at [facility], [address] [place], [place] [address]    Report Status 11/19/2019 FINAL  Final  Culture, blood (Routine X 2) w Reflex to ID Panel     Status: None   Collection Time: 11/14/19  5:40 AM   Specimen: BLOOD  Result Value Ref Range Status   Specimen Description BLOOD SITE NOT SPECIFIED  Final   Special Requests   Final    BOTTLES DRAWN AEROBIC AND ANAEROBIC Blood Culture adequate volume   Culture   Final    NO GROWTH 5 DAYS Performed at [facility], [address] [place],  [address]    Report Status 11/19/2019 FINAL  Final  Respiratory Panel by RT PCR (Flu A&B, Covid) - Nasopharyngeal Swab     Status: None   Collection Time: 11/18/19  3:04 PM   Specimen: Nasopharyngeal Swab  Result Value Ref Range Status   SARS Coronavirus 2 by RT PCR NEGATIVE NEGATIVE Final    Comment: (NOTE) SARS-CoV-2 target nucleic acids are NOT DETECTED.  The SARS-CoV-2 RNA is generally detectable in upper respiratoy specimens during the acute phase of infection. The lowest concentration of SARS-CoV-2 viral copies this assay can detect is 131 copies/mL. A negative result does not preclude SARS-Cov-2 infection and should not be used as the sole basis for treatment or other patient management decisions. A negative result may occur with  improper specimen collection/handling, submission of specimen other than nasopharyngeal swab, presence of viral mutation(s) within the areas targeted by this assay, and inadequate number of viral copies (<131 copies/mL). A negative result must be combined with clinical observations, patient history, and epidemiological information. The expected result is Negative.  Fact Sheet for Patients:   PinkCheek.be  Fact Sheet for Healthcare Providers:  GravelBags.it  This test is no t yet approved or cleared by the [place] FDA and  has been authorized for detection and/or diagnosis of SARS-CoV-2 by FDA under an Emergency Use Authorization (EUA). This EUA will remain  in effect (meaning this test can be used) for the duration of the COVID-19 declaration under Section 564(b)(1) of the Act, 21 U.S.C. section 360bbb-3(b)(1), unless the authorization is terminated or  revoked sooner.     Influenza A by PCR NEGATIVE NEGATIVE Final   Influenza B by PCR NEGATIVE NEGATIVE Final    Comment: (NOTE) The Xpert Xpress SARS-CoV-2/FLU/RSV assay is intended as an aid in  the diagnosis of influenza from Nasopharyngeal swab specimens and  should not be used as a sole basis for treatment. Nasal washings and  aspirates are unacceptable for Xpert Xpress SARS-CoV-2/FLU/RSV  testing.  Fact Sheet for Patients: PinkCheek.be  Fact Sheet for Healthcare Providers: GravelBags.it  This test is not yet approved or cleared by the [place] FDA and  has been authorized for detection and/or diagnosis of SARS-CoV-2 by  FDA under an Emergency Use Authorization (EUA). This EUA will remain  in effect (meaning this test can be used) for the duration of the  Covid-19 declaration under Section 564(b)(1) of the Act, 21  U.S.C. section 360bbb-3(b)(1), unless the authorization is  terminated or revoked. Performed at [facility], [address], [place], [place] [address]      Labs: Basic Metabolic Panel: Recent Labs  Lab 11/14/19 0540 11/15/19 0827 11/16/19 0941 11/17/19 0853 11/18/19 0742  NA 134* 133* 132*  132* 132* 132*  K 6.2* 4.8 5.3*  5.4* 3.4* 3.9  CL 98 95* 95*  94* 93* 96*  CO2 25 26 25  25 28 26   GLUCOSE 94 108* 89  88 189* 154*  BUN 40* 19 25*  25* 12 17   CREATININE 4.48* 2.83* 3.68*  3.74* 2.46* 3.44*  CALCIUM 8.4* 8.2* 8.3*  8.2* 8.5* 7.9*  MG 2.6*  --   --   --   --   PHOS 4.0 3.2 3.5  --  2.2*   Liver Function Tests: Recent Labs  Lab 11/14/19 0540 11/15/19 0827 11/16/19 0941 11/18/19 0742  AST 41  --   --   --   ALT 48*  --   --   --   ALKPHOS 198*  --   --   --   BILITOT 1.3*  --   --   --   PROT 6.7  --   --   --   ALBUMIN 3.3* 3.4* 3.6 3.2*   Recent Labs  Lab 11/16/19 1611  LIPASE 18   No results for input(s): AMMONIA in the last 168 hours. CBC: Recent Labs  Lab 11/13/19 1743 11/14/19 0540 11/16/19 0941 11/18/19 0742  WBC 8.7 7.2 5.2 5.4  HGB 10.5* 8.3* 9.4* 9.8*  HCT 34.8* 27.3* 29.6* 30.2*  MCV 113.4* 112.3* 108.0* 106.3*  PLT 172 164 152 160   Cardiac Enzymes: No results for input(s): CKTOTAL, CKMB, CKMBINDEX, TROPONINI in the last 168 hours. BNP: BNP (last 3 results) No results for input(s): BNP in the last 8760 hours.  ProBNP (last 3 results) No results for input(s): PROBNP in the last 8760 hours.  CBG: Recent Labs  Lab 11/18/19 0750 11/18/19 1124 11/18/19 1555 11/18/19 2056 11/19/19 0802  GLUCAP 146* 266* 171* 92 183*       Signed:  Oswald Hillock MD.  [facility] 11/19/2019, 11:06 AM

## 2019-11-19 NOTE — Discharge Instructions (Addendum)
Your blood pressure medications have been changed according to your discharge information from earlier today.  Your blood pressure is still mildly elevated but not critically high.  Be sure to take your blood pressure medication as the doctor has prescribed.  You have an appointment to see your primary care provider on Friday.  Be sure to keep this appointment.

## 2019-11-19 NOTE — ED Notes (Signed)
Pt provided with a cup of juice per request at this time.

## 2019-11-19 NOTE — ED Provider Notes (Signed)
[facility] EMERGENCY DEPARTMENT Provider Note   CSN: [id number] Arrival date & time: 11/19/19  1826     History Chief Complaint  Patient presents with  . Hypertension    Erica [name] is a 64 y.o. female.  HPI      Erica [name] is a 64 y.o. female with past medical history of diabetes, diastolic CHF, alcohol induced pancreatitis, hypertension, end-stage renal disease and on hemodialysis and seizures.  She brought to the Emergency Department after discharge from the floor earlier today.  She was hospitalized for hyperkalemia and received calcium gluconate and insulin, she underwent hemodialysis yesterday and her potassium prior to discharge this morning was 4.8.  She was to be discharged to [facility] skilled nursing facility and she was noted to be mildly hypertensive at time of discharge.  it was reported to me that [facility] would not transport patient to [facility] due to her systolic blood pressure being greater than 180.  Patient has history of hypertension.  Her medications at time of discharge earlier today have been changed.  Her nifedipine dose has been changed to 60 mg daily and she has been prescribed losartan 25 mg daily, and Coreg 6.25 mg twice daily.  She reports having some mild headache, but denies chest pain, shortness of breath, nausea or vomiting, dizziness or visual changes.  No focal weakness.  Past Medical History:  Diagnosis Date  . Alcohol-induced pancreatitis   . Chronic diarrhea   . Depression   . Diabetes mellitus    fasting blood sugar 110-120s  . Diastolic CHF ([place])   . DKA (diabetic ketoacidoses)   . Gastroparesis   . GERD (gastroesophageal reflux disease)   . Heart murmur   . History of kidney stones   . Hyperlipidemia   . Hypertension   . Hypokalemia   . Muscle spasm   . Neuropathic pain   . Neuropathy    Hx: of  . Pyelonephritis   . Seizures ([place])   . Vitamin B12 deficiency   . Vitamin D deficiency     Patient  Active Problem List   Diagnosis Date Noted  . Palliative care by specialist   . Generalized weakness 11/14/2019  . HCAP (healthcare-associated pneumonia) 11/14/2019  . Hyperkalemia 11/13/2019  . Hyponatremia 09/27/2019  . Closed fracture of right proximal humerus 04/20/2019  . Surgery, elective   . Seizure ([place]) 11/12/2018  . History of hydronephrosis --stents in place 12/08/2017  . Stroke ([place]) 11/29/2017  . Seizures ([place])   . Hydronephrosis   . Recurrent UTI   . Diabetes mellitus type 2 in nonobese (HCC)   . ESRD (end stage renal disease) ([place])   . Anemia of chronic disease   . Chronic diastolic congestive heart failure ([place])   . Essential hypertension   . PICC (peripherally inserted central catheter) in place   . Acute pyelonephritis 11/12/2017  . Uncontrolled type 2 diabetes mellitus with hyperglycemia, with long-term current use of insulin ([place]) 11/10/2017  . Renal failure 11/08/2017  . Seizure disorder ([place]) 11/08/2017  . Orthostatic hypotension 07/25/2014  . Moderate protein malnutrition ([place]) 09/15/2013  . Aspiration pneumonia ([place]) 09/15/2013  . Acute respiratory failure requiring reintubation ([place]) 09/11/2013  . probable Seizures due to metabolic disorder 38/25/0539  . Lactic acidosis 03/19/2013  . Abdominal pain 03/19/2013  . Rotavirus infection 10/29/2012  . Type II or unspecified type diabetes mellitus without mention of complication, uncontrolled 10/29/2012  . Protein-calorie malnutrition, severe ([place]) 10/27/2012  . NSTEMI (non-ST  elevated myocardial infarction) ([place]) 10/26/2012  . Fever, unspecified 10/26/2012  . Hypotension 10/25/2012  . Metabolic acidosis 52/84/1324  . Chronic diarrhea 10/25/2012  . Tobacco abuse 10/25/2012  . DKA (diabetic ketoacidoses) 09/09/2012  . Dehydration 09/09/2012  . DKA, type 2 ([place]) 05/20/2012  . Abnormal LFTs 05/20/2012  . Heart murmur, systolic 40/11/2723  . Hypoglycemia 07/08/2011  . Metabolic encephalopathy 36/64/4034    . Alcohol abuse 07/08/2011  . Hypokalemia 07/08/2011  . Nausea & vomiting 07/08/2011  . H/O chronic pancreatitis 07/08/2011    Past Surgical History:  Procedure Laterality Date  . AV FISTULA PLACEMENT Left 12/15/2017   Procedure: INSERTION OF ARTERIOVENOUS (AV) GORE-TEX GRAFT ARM;  Surgeon: Rosetta Posner, MD;  Location: [facility];  Service: Vascular;  Laterality: Left;  . CATARACT EXTRACTION W/PHACO Right 03/14/2015   Procedure: CATARACT EXTRACTION PHACO AND INTRAOCULAR LENS PLACEMENT (IOC);  Surgeon: Baruch Goldmann, MD;  Location: [facility];  Service: Ophthalmology;  Laterality: Right;  CDE:11.13  . CATARACT EXTRACTION W/PHACO Left 04/11/2015   Procedure: CATARACT EXTRACTION PHACO AND INTRAOCULAR LENS PLACEMENT LEFT EYE CDE=9.68;  Surgeon: Baruch Goldmann, MD;  Location: [facility];  Service: Ophthalmology;  Laterality: Left;  . COLONOSCOPY  02/24/2010  . cystoscopy with ureteral stent  Bilateral 10/07/2017   At [place] CV LINE RIGHT  11/17/2017  . IR FLUORO GUIDE CV LINE RIGHT  11/22/2017  . IR REMOVAL TUN CV CATH W/O FL  01/26/2018  . IR US GUIDE VASC ACCESS RIGHT  11/17/2017  . MULTIPLE EXTRACTIONS WITH ALVEOLOPLASTY N/A 06/13/2012   Procedure: MULTIPLE EXTRACION WITH ALVEOLOPLASTY EXTRACT: 18, 19, 20, 21, 22, 24, 25, 27, 28, 29, 30, 31;  Surgeon: Gae Bon, DDS;  Location: [facility];  Service: Oral Surgery;  Laterality: N/A;  . ORIF HUMERUS FRACTURE Right 04/20/2019   Procedure: OPEN REDUCTION INTERNAL FIXATION (ORIF) PROXIMAL HUMERUS FRACTURE;  Surgeon: Marchia Bond, MD;  Location: [facility];  Service: Orthopedics;  Laterality: Right;  . TUBAL LIGATION    . URETERAL STENT PLACEMENT  09/2017     OB History    Gravida      Para      Term      Preterm      AB      Living  2     SAB      TAB      Ectopic      Multiple      Live Births              Family History  Problem Relation Age of Onset  . Diabetes Sister   . Chronic Renal Failure Neg Hx      Social History   Tobacco Use  . Smoking status: Never Smoker  . Smokeless tobacco: Current User    Types: Snuff  Vaping Use  . Vaping Use: Never used  Substance Use Topics  . Alcohol use: Not Currently    Alcohol/week: 1.0 standard drink    Types: 1 Cans of beer per week  . Drug use: No    Home Medications Prior to Admission medications   Medication Sig Start Date End Date Taking? Authorizing Provider  acetaminophen (TYLENOL) 325 MG tablet Take 650 mg by mouth every 4 (four) hours as needed for moderate pain.    [provider]  albuterol (PROAIR HFA) 108 (90 Base) MCG/ACT inhaler Inhale 2 puffs into the lungs every 4 (four) hours as needed for wheezing or shortness of  breath.     [provider]  calcitRIOL (ROCALTROL) 0.25 MCG capsule Take 1 capsule (0.25 mcg total) by mouth Every Tuesday,Thursday,and Saturday with dialysis. 12/18/17   Love, Ivan Anchors, PA-C  carboxymethylcellulose (ARTIFICIAL TEARS) 1 % ophthalmic solution Place 1 drop into both eyes daily as needed (dry eyes).    [provider]  carvedilol (COREG) 6.25 MG tablet Take 1 tablet (6.25 mg total) by mouth 2 (two) times daily. 11/15/18   Kinnie Feil, MD  cinacalcet (SENSIPAR) 30 MG tablet Take 30 mg by mouth every evening.    [provider]  dextrose (GLUTOSE) 40 % GEL Take 1 Tube by mouth every 10 (ten) minutes as needed for low blood sugar (unitl blood sugar is above 70).    [provider]  Emollient (CETAPHIL) cream Apply 1 application topically every 12 (twelve) hours as needed (dry skin).    [provider]  ferric citrate (AURYXIA) 1 GM 210 MG(Fe) tablet Take 2 tablets (420 mg total) by mouth 3 (three) times daily with meals. 11/15/18   Kinnie Feil, MD  folic acid (FOLVITE) 1 MG tablet Take 1 mg by mouth every morning.    [provider]  hydrALAZINE (APRESOLINE) 25 MG tablet Take 1 tablet (25 mg total) by mouth every 8 (eight) hours.  11/19/19   Oswald Hillock, MD  insulin lispro (HUMALOG) 100 UNIT/ML injection Inject 2-10 Units into the skin 4 (four) times daily. Per sliding scale: CBG 151-200 2 units, 201-250 4 units, 251-300 6 units, 301-350 8 units, 351-400 10 units,    [provider]  levETIRAcetam (KEPPRA) 1000 MG tablet Take 1,000 mg by mouth every morning.    [provider]  loperamide (IMODIUM) 2 MG capsule Take 4 mg by mouth daily as needed for diarrhea or loose stools.    [provider]  losartan (COZAAR) 25 MG tablet Take 1 tablet (25 mg total) by mouth daily. 11/20/19   Oswald Hillock, MD  metoCLOPramide (REGLAN) 5 MG tablet Take 1 tablet (5 mg total) by mouth every 6 (six) hours as needed for nausea, vomiting or refractory nausea / vomiting. 11/19/19   Oswald Hillock, MD  NIFEdipine (ADALAT CC) 60 MG 24 hr tablet Take 1 tablet (60 mg total) by mouth every morning. 11/20/19   Oswald Hillock, MD  pantoprazole (PROTONIX) 40 MG tablet Take 1 tablet (40 mg total) by mouth daily. 11/19/19 01/18/20  Oswald Hillock, MD  phenytoin (DILANTIN INFATABS) 50 MG tablet Chew 6 tablets (300 mg total) by mouth daily. 09/29/19 10/29/19  Manuella Ghazi, Pratik D, DO  polyethylene glycol (MIRALAX) 17 g packet Take 17 g by mouth daily. 08/10/19   Tacy Learn, PA-C  rOPINIRole (REQUIP) 1 MG tablet Take 1 mg by mouth 3 (three) times daily.    [provider]  senna (SENOKOT) 8.6 MG TABS tablet Take 8.6 mg by mouth in the morning and at bedtime.    [provider]  topiramate (TOPAMAX) 25 MG tablet Take 1 tablet (25 mg total) by mouth 2 (two) times daily. 11/29/17 11/12/19  Arrien, Jimmy Picket, MD  Vitamin D, Ergocalciferol, (DRISDOL) 1.25 MG (50000 UT) CAPS capsule Take 50,000 Units by mouth every Friday.    [provider]    Allergies    Aspirin  Review of Systems   Review of Systems  Constitutional: Negative for chills, fatigue and fever.  HENT: Negative for trouble swallowing.    Respiratory: Negative for shortness  of breath and wheezing.   Cardiovascular: Negative for chest pain, palpitations and leg swelling.  Gastrointestinal: Negative for abdominal pain, nausea and vomiting.  Musculoskeletal: Negative for myalgias, neck pain and neck stiffness.  Skin: Negative for rash.  Neurological: Positive for headaches. Negative for dizziness, syncope, speech difficulty, weakness and numbness.  Hematological: Does not bruise/bleed easily.    Physical Exam Updated Vital Signs BP (!) 166/57 (BP Location: Right Arm)   Pulse 80   Temp 99.5 F (37.5 C) (Oral)   Resp 18   SpO2 99%   Physical Exam Vitals and nursing note reviewed.  Constitutional:      Appearance: Normal appearance. She is not toxic-appearing.     Comments: Elderly appearing 64 year old female.  Nontoxic-appearing.  HENT:     Head: Normocephalic.     Mouth/Throat:     Mouth: Mucous membranes are moist.  Eyes:     Extraocular Movements: Extraocular movements intact.     Conjunctiva/sclera: Conjunctivae normal.     Pupils: Pupils are equal, round, and reactive to light.  Neck:     Thyroid: No thyromegaly.     Meningeal: Kernig's sign absent.  Cardiovascular:     Rate and Rhythm: Normal rate and regular rhythm.     Pulses: Normal pulses.  Pulmonary:     Effort: Pulmonary effort is normal.     Breath sounds: Normal breath sounds. No wheezing.  Abdominal:     Palpations: Abdomen is soft.     Tenderness: There is no abdominal tenderness. There is no guarding or rebound.  Musculoskeletal:        General: Normal range of motion.     Cervical back: Normal range of motion and neck supple.  Skin:    General: Skin is warm.     Capillary Refill: Capillary refill takes less than 2 seconds.     Findings: No rash.  Neurological:     General: No focal deficit present.     Mental Status: She is alert and oriented to person, place, and time.     GCS: GCS eye subscore is 4. GCS verbal subscore is 5. GCS  motor subscore is 6.     Sensory: Sensation is intact.     Motor: Motor function is intact.     Comments: CN II through XII appear grossly intact.  Speech clear.  No pronator drift.     ED Results / Procedures / Treatments   Labs (all labs ordered are listed, but only abnormal results are displayed) Labs Reviewed - No data to display  EKG None  Radiology No results found.  Procedures Procedures (including critical care time)  Medications Ordered in ED Medications - No data to display  ED Course  I have reviewed the triage vital signs and the nursing notes.  Pertinent labs & imaging results that were available during my care of the patient were reviewed by me and considered in my medical decision making (see chart for details).    MDM Rules/Calculators/A&P                          Patient discharged from this hospital earlier today has known history of hypertension.  And it was reported to me by ED nursing staff that she was brought here because EMS would not transport to [facility] due to her systolic blood pressure being greater than 180.  Currently, blood pressure is 166/57.  On exam she is without focal neuro deficits.  She complains of mild frontal headache but no meningeal signs on exam.  Her blood pressure medications or recently changed and new medications added.  I feel that she is appropriate for discharge to SNF with new antihypertensive medication regimen in place.  She has PCP follow-up later this week.     Final Clinical Impression(s) / ED Diagnoses Final diagnoses:  Hypertension, unspecified type    Rx / DC Orders ED Discharge Orders    None       Kem Parkinson, PA-C 11/19/19 2058    Noemi Chapel, MD 11/21/19 [phone]

## 2019-11-19 NOTE — TOC Transition Note (Signed)
Transition of Care [facility]) - CM/SW Discharge Note   Patient Details  Name: Erica [name] MRN: [id number] Date of Birth: Nov 25, 1955  Transition of Care [facility]) CM/SW Contact:  Natasha Bence, LCSW Phone Number: 11/19/2019, 11:37 AM   Clinical Narrative:    CSW received notification of patient's readiness for discharge. CSW contacted Melissa with [facility] to confirm bed availability and to notify them of patient's palliative consult. Melissa with [facility] reported that they would set up the palliative consult with the order provided by MD and confirmed bed availability. CSW completed med necessity form and called EMS. Nurse to call report. TOC signing off.    Final next level of care: [facility] Barriers to Discharge: Barriers Resolved   Patient Goals and CMS Choice Patient states their goals for this hospitalization and ongoing recovery are:: Discharge to SNF   Choice offered to / list presented to : Patient, Adult Children  Discharge Placement                Patient to be transferred to facility by: [facility] Name of family member notified: [name] Patient and family notified of of transfer: 11/19/19  Discharge Plan and Services In-house Referral: Clinical Social Work                                   Social Determinants of Health (SDOH) Interventions     Readmission Risk Interventions Readmission Risk Prevention Plan 11/19/2019 11/15/2019 11/14/2019  Transportation Screening Complete - Complete  Medication Review [employer]) Complete - Complete  PCP or Specialist appointment within 3-5 days of discharge Complete Complete -  HRI or Home Care Consult Complete - Complete  SW Recovery Care/Counseling Consult Complete - Complete  Palliative Care Screening Complete - Not Applicable  Skilled Nursing Facility Complete - Not Applicable  Some recent data might be hidden

## 2020-03-20 ENCOUNTER — Emergency Department (HOSPITAL_COMMUNITY)
Admission: EM | Admit: 2020-03-20 | Discharge: 2020-03-21 | Disposition: A | Discharge status: Home / Self Care | Payer: Medicare Other | Attending: Emergency Medicine | Admitting: Emergency Medicine

## 2020-03-20 ENCOUNTER — Encounter (HOSPITAL_COMMUNITY): Payer: Self-pay

## 2020-03-20 ENCOUNTER — Emergency Department (HOSPITAL_COMMUNITY): Payer: Medicare Other

## 2020-03-20 ENCOUNTER — Other Ambulatory Visit: Payer: Self-pay

## 2020-03-20 DIAGNOSIS — N186 End stage renal disease: Secondary | ICD-10-CM | POA: Diagnosis not present

## 2020-03-20 DIAGNOSIS — I5032 Chronic diastolic (congestive) heart failure: Secondary | ICD-10-CM | POA: Insufficient documentation

## 2020-03-20 DIAGNOSIS — I132 Hypertensive heart and chronic kidney disease with heart failure and with stage 5 chronic kidney disease, or end stage renal disease: Secondary | ICD-10-CM | POA: Insufficient documentation

## 2020-03-20 DIAGNOSIS — R197 Diarrhea, unspecified: Secondary | ICD-10-CM | POA: Diagnosis present

## 2020-03-20 DIAGNOSIS — E1140 Type 2 diabetes mellitus with diabetic neuropathy, unspecified: Secondary | ICD-10-CM | POA: Diagnosis not present

## 2020-03-20 DIAGNOSIS — Z794 Long term (current) use of insulin: Secondary | ICD-10-CM | POA: Insufficient documentation

## 2020-03-20 DIAGNOSIS — R002 Palpitations: Secondary | ICD-10-CM | POA: Diagnosis not present

## 2020-03-20 DIAGNOSIS — Z79899 Other long term (current) drug therapy: Secondary | ICD-10-CM | POA: Diagnosis not present

## 2020-03-20 DIAGNOSIS — K219 Gastro-esophageal reflux disease without esophagitis: Secondary | ICD-10-CM | POA: Diagnosis not present

## 2020-03-20 DIAGNOSIS — Z992 Dependence on renal dialysis: Secondary | ICD-10-CM | POA: Insufficient documentation

## 2020-03-20 DIAGNOSIS — R109 Unspecified abdominal pain: Secondary | ICD-10-CM

## 2020-03-20 DIAGNOSIS — R1013 Epigastric pain: Secondary | ICD-10-CM | POA: Insufficient documentation

## 2020-03-20 DIAGNOSIS — E1122 Type 2 diabetes mellitus with diabetic chronic kidney disease: Secondary | ICD-10-CM | POA: Insufficient documentation

## 2020-03-20 MED ORDER — ONDANSETRON HCL 4 MG/2ML IJ SOLN
4.0000 mg | Freq: Once | INTRAMUSCULAR | Status: DC
  Filled 2020-03-20: qty 2

## 2020-03-20 MED ORDER — SODIUM CHLORIDE 0.9 % IV BOLUS: 250.0000 mL | Freq: Once | INTRAVENOUS | Status: DC

## 2020-03-20 NOTE — ED Provider Notes (Signed)
[facility] EMERGENCY DEPARTMENT Provider Note   CSN: [id number] Arrival date & time: 03/20/20  1843     History Chief Complaint  Patient presents with   Abdominal Pain    Erica [name] is a 65 y.o. female.  Patient is a 65 year old female with past medical history of diabetes, hypertension, end-stage renal disease on hemodialysis.  She presents today for evaluation of diarrhea.  This has been ongoing for the past several days.  It has been nonbloody.  Patient describes discomfort to her upper abdomen that has been constant.  She denies any vomiting.  She denies fevers or chills.  Patient dialyzes Tuesdays, Thursdays, and Saturdays.  She tells me she went yesterday and had a full session.  She denies ill contacts.  The history is provided by the patient.  Abdominal Pain Pain location:  Epigastric Pain quality: cramping   Pain radiates to:  Does not radiate Pain severity:  Moderate Onset quality:  Gradual Duration:  3 days Timing:  Constant Progression:  Worsening Chronicity:  New Relieved by:  Nothing Worsened by:  Movement and palpation Ineffective treatments:  None tried Associated symptoms: diarrhea   Associated symptoms: no cough, no fever, no hematochezia and no shortness of breath        Past Medical History:  Diagnosis Date   Alcohol-induced pancreatitis    Chronic diarrhea    Depression    Diabetes mellitus    fasting blood sugar [id number]   Diastolic CHF (HCC)    DKA (diabetic ketoacidoses)    Gastroparesis    GERD (gastroesophageal reflux disease)    Heart murmur    History of kidney stones    Hyperlipidemia    Hypertension    Hypokalemia    Muscle spasm    Neuropathic pain    Neuropathy    Hx: of   Pyelonephritis    Seizures ([place])    Vitamin B12 deficiency    Vitamin D deficiency     Patient Active Problem List   Diagnosis Date Noted   Palliative care by specialist    Generalized weakness 11/14/2019   HCAP  (healthcare-associated pneumonia) 11/14/2019   Hyperkalemia 11/13/2019   Hyponatremia 09/27/2019   Closed fracture of right proximal humerus 04/20/2019   Surgery, elective    Seizure ([place]) 11/12/2018   History of hydronephrosis --stents in place 12/08/2017   Stroke ([place]) 11/29/2017   Seizures ([place])    Hydronephrosis    Recurrent UTI    Diabetes mellitus type 2 in nonobese ([place])    ESRD (end stage renal disease) ([place])    Anemia of chronic disease    Chronic diastolic congestive heart failure ([place])    Essential hypertension    PICC (peripherally inserted central catheter) in place    Acute pyelonephritis 11/12/2017   Uncontrolled type 2 diabetes mellitus with hyperglycemia, with long-term current use of insulin ([place]) 11/10/2017   Renal failure 11/08/2017   Seizure disorder ([place]) 11/08/2017   Orthostatic hypotension 07/25/2014   Moderate protein malnutrition ([place]) 09/15/2013   Aspiration pneumonia ([place]) 09/15/2013   Acute respiratory failure requiring reintubation ([place]) 09/11/2013   probable Seizures due to metabolic disorder [id number]   Lactic acidosis 03/19/2013   Abdominal pain 03/19/2013   Rotavirus infection 10/29/2012   Type II or unspecified type diabetes mellitus without mention of complication, uncontrolled 10/29/2012   Protein-calorie malnutrition, severe ([place]) 10/27/2012   NSTEMI (non-ST elevated myocardial infarction) ([place]) 10/26/2012   Fever, unspecified 10/26/2012   Hypotension [id number]   Metabolic acidosis  10/25/2012   Chronic diarrhea 10/25/2012   Tobacco abuse 10/25/2012   DKA (diabetic ketoacidoses) 09/09/2012   Dehydration 09/09/2012   DKA, type 2 ([place]) 05/20/2012   Abnormal LFTs 05/20/2012   Heart murmur, systolic [id number]   Hypoglycemia [id number]   Metabolic encephalopathy [id number]   Alcohol abuse 07/08/2011   Hypokalemia 07/08/2011   Nausea & vomiting 07/08/2011   H/O chronic pancreatitis  07/08/2011    Past Surgical History:  Procedure Laterality Date   AV FISTULA PLACEMENT Left 12/15/2017   Procedure: INSERTION OF ARTERIOVENOUS (AV) GORE-TEX GRAFT ARM;  Surgeon: Rosetta Posner, MD;  Location: [facility] OR;  Service: Vascular;  Laterality: Left;   CATARACT EXTRACTION W/PHACO Right 03/14/2015   Procedure: CATARACT EXTRACTION PHACO AND INTRAOCULAR LENS PLACEMENT ([place]);  Surgeon: Baruch Goldmann, MD;  Location: [facility] ORS;  Service: Ophthalmology;  Laterality: Right;  CDE:11.13   CATARACT EXTRACTION W/PHACO Left 04/11/2015   Procedure: CATARACT EXTRACTION PHACO AND INTRAOCULAR LENS PLACEMENT LEFT EYE CDE=9.68;  Surgeon: Baruch Goldmann, MD;  Location: [facility] ORS;  Service: Ophthalmology;  Laterality: Left;   COLONOSCOPY  02/24/2010   cystoscopy with ureteral stent  Bilateral 10/07/2017   At [place] CV LINE RIGHT  11/17/2017   IR FLUORO GUIDE CV LINE RIGHT  11/22/2017   IR REMOVAL TUN CV CATH W/O FL  01/26/2018   IR US GUIDE VASC ACCESS RIGHT  11/17/2017   MULTIPLE EXTRACTIONS WITH ALVEOLOPLASTY N/A 06/13/2012   Procedure: MULTIPLE EXTRACION WITH ALVEOLOPLASTY EXTRACT: 18, 19, 20, 21, 22, 24, 25, 27, 28, 29, 30, 31;  Surgeon: Gae Bon, DDS;  Location: [place];  Service: Oral Surgery;  Laterality: N/A;   ORIF HUMERUS FRACTURE Right 04/20/2019   Procedure: OPEN REDUCTION INTERNAL FIXATION (ORIF) PROXIMAL HUMERUS FRACTURE;  Surgeon: Marchia Bond, MD;  Location: [facility];  Service: Orthopedics;  Laterality: Right;   TUBAL LIGATION     URETERAL STENT PLACEMENT  09/2017     OB History    Gravida      Para      Term      Preterm      AB      Living  2     SAB      IAB      Ectopic      Multiple      Live Births              Family History  Problem Relation Age of Onset   Diabetes Sister    Chronic Renal Failure Neg Hx     Social History   Tobacco Use   Smoking status: Never Smoker   Smokeless tobacco: Current User    Types:  Snuff  Vaping Use   Vaping Use: Never used  Substance Use Topics   Alcohol use: Not Currently    Alcohol/week: 1.0 standard drink    Types: 1 Cans of beer per week   Drug use: No    Home Medications Prior to Admission medications   Medication Sig Start Date End Date Taking? Authorizing Provider  acetaminophen (TYLENOL) 325 MG tablet Take 650 mg by mouth every 4 (four) hours as needed for moderate pain.    [provider]  albuterol (PROAIR HFA) 108 (90 Base) MCG/ACT inhaler Inhale 2 puffs into the lungs every 4 (four) hours as needed for wheezing or shortness of breath.     [provider]  calcitRIOL (ROCALTROL) 0.25 MCG capsule Take 1 capsule (0.25 mcg  total) by mouth Every Tuesday,Thursday,and Saturday with dialysis. 12/18/17   Love, Ivan Anchors, PA-C  carboxymethylcellulose (ARTIFICIAL TEARS) 1 % ophthalmic solution Place 1 drop into both eyes daily as needed (dry eyes).    [provider]  carvedilol (COREG) 6.25 MG tablet Take 1 tablet (6.25 mg total) by mouth 2 (two) times daily. 11/15/18   Kinnie Feil, MD  cinacalcet (SENSIPAR) 30 MG tablet Take 30 mg by mouth every evening.    [provider]  dextrose (GLUTOSE) 40 % GEL Take 1 Tube by mouth every 10 (ten) minutes as needed for low blood sugar (unitl blood sugar is above 70).    [provider]  Emollient (CETAPHIL) cream Apply 1 application topically every 12 (twelve) hours as needed (dry skin).    [provider]  ferric citrate (AURYXIA) 1 GM 210 MG(Fe) tablet Take 2 tablets (420 mg total) by mouth 3 (three) times daily with meals. 11/15/18   Kinnie Feil, MD  folic acid (FOLVITE) 1 MG tablet Take 1 mg by mouth every morning.    [provider]  hydrALAZINE (APRESOLINE) 25 MG tablet Take 1 tablet (25 mg total) by mouth every 8 (eight) hours. 11/19/19   Oswald Hillock, MD  insulin lispro (HUMALOG) 100 UNIT/ML injection Inject 2-10 Units into the skin 4 (four)  times daily. Per sliding scale: CBG 151-200 2 units, 201-250 4 units, 251-300 6 units, 301-350 8 units, 351-400 10 units,    [provider]  levETIRAcetam (KEPPRA) 1000 MG tablet Take 1,000 mg by mouth every morning.    [provider]  loperamide (IMODIUM) 2 MG capsule Take 4 mg by mouth daily as needed for diarrhea or loose stools.    [provider]  losartan (COZAAR) 25 MG tablet Take 1 tablet (25 mg total) by mouth daily. 11/20/19   Oswald Hillock, MD  metoCLOPramide (REGLAN) 5 MG tablet Take 1 tablet (5 mg total) by mouth every 6 (six) hours as needed for nausea, vomiting or refractory nausea / vomiting. 11/19/19   Oswald Hillock, MD  NIFEdipine (ADALAT CC) 60 MG 24 hr tablet Take 1 tablet (60 mg total) by mouth every morning. 11/20/19   Oswald Hillock, MD  pantoprazole (PROTONIX) 40 MG tablet Take 1 tablet (40 mg total) by mouth daily. 11/19/19 01/18/20  Oswald Hillock, MD  phenytoin (DILANTIN INFATABS) 50 MG tablet Chew 6 tablets (300 mg total) by mouth daily. 09/29/19 10/29/19  Manuella Ghazi, Pratik D, DO  polyethylene glycol (MIRALAX) 17 g packet Take 17 g by mouth daily. 08/10/19   Tacy Learn, PA-C  rOPINIRole (REQUIP) 1 MG tablet Take 1 mg by mouth 3 (three) times daily.    [provider]  senna (SENOKOT) 8.6 MG TABS tablet Take 8.6 mg by mouth in the morning and at bedtime.    [provider]  topiramate (TOPAMAX) 25 MG tablet Take 1 tablet (25 mg total) by mouth 2 (two) times daily. 11/29/17 11/12/19  Arrien, Jimmy Picket, MD  Vitamin D, Ergocalciferol, (DRISDOL) 1.25 MG (50000 UT) CAPS capsule Take 50,000 Units by mouth every Friday.    [provider]    Allergies    Aspirin  Review of Systems   Review of Systems  Constitutional: Negative for fever.  Respiratory: Negative for cough and shortness of breath.   Gastrointestinal: Positive for abdominal pain and diarrhea. Negative for hematochezia.  All other systems reviewed and are  negative.   Physical Exam Updated  Vital Signs BP (!) 143/62    Pulse 70    Temp 98.5 F (36.9 C) (Oral)    Resp 16    Ht '4\' 11"'$  (1.499 m)    Wt 45.4 kg    SpO2 99%    BMI 20.20 kg/m   Physical Exam Vitals and nursing note reviewed.  Constitutional:      General: She is not in acute distress.    Appearance: She is well-developed and well-nourished. She is not diaphoretic.  HENT:     Head: Normocephalic and atraumatic.  Cardiovascular:     Rate and Rhythm: Normal rate and regular rhythm.     Heart sounds: No murmur heard. No friction rub. No gallop.   Pulmonary:     Effort: Pulmonary effort is normal. No respiratory distress.     Breath sounds: Normal breath sounds. No wheezing.  Abdominal:     General: Bowel sounds are normal. There is no distension.     Palpations: Abdomen is soft.     Tenderness: There is abdominal tenderness in the epigastric area. There is no right CVA tenderness, left CVA tenderness, guarding or rebound.  Musculoskeletal:        General: Normal range of motion.     Cervical back: Normal range of motion and neck supple.  Skin:    General: Skin is warm and dry.  Neurological:     Mental Status: She is alert and oriented to person, place, and time.     ED Results / Procedures / Treatments   Labs (all labs ordered are listed, but only abnormal results are displayed) Labs Reviewed  COMPREHENSIVE METABOLIC PANEL  LIPASE, BLOOD  CBC WITH DIFFERENTIAL/PLATELET    EKG None  Radiology No results found.  Procedures Procedures   Medications Ordered in ED Medications  sodium chloride 0.9 % bolus 250 mL (has no administration in time range)  ondansetron (ZOFRAN) injection 4 mg (has no administration in time range)    ED Course  I have reviewed the triage vital signs and the nursing notes.  Pertinent labs & imaging results that were available during my care of the patient were reviewed by me and considered in my medical decision making (see chart  for details).    MDM Rules/Calculators/A&P  Patient presenting here with complaints of abdominal pain and loose stools for the past several days.  She does have mild tenderness over the abdomen, but there are no peritoneal signs.  Noncontrast CT scan was obtained showing no acute abnormality.  Her laboratory studies are essentially unremarkable as well.  At this point, I suspect a viral etiology.  I see no indication for admission or surgical intervention.  Patient to be discharged with Zofran and is advised to attend her dialysis tomorrow as scheduled.  Final Clinical Impression(s) / ED Diagnoses Final diagnoses:  None    Rx / DC Orders ED Discharge Orders    None       Veryl Speak, MD 03/21/20 [phone]

## 2020-03-20 NOTE — ED Triage Notes (Signed)
Pt to er, pt states that she is having diarrhea and abd pain since Monday.  States that it is a constant pain.  Pt states that she also feels a little "swimmy head" pt talking in full sentences, resps even and unlabored, pt answering questions appropriately.  Pt has a hot chocolate to drink in the waiting room

## 2020-03-21 DIAGNOSIS — R197 Diarrhea, unspecified: Secondary | ICD-10-CM | POA: Diagnosis not present

## 2020-03-21 LAB — COMPREHENSIVE METABOLIC PANEL
ALT: 39 U/L (ref 0–44)
AST: 43 U/L — ABNORMAL HIGH (ref 15–41)
Albumin: 2.8 g/dL — ABNORMAL LOW (ref 3.5–5.0)
Alkaline Phosphatase: 253 U/L — ABNORMAL HIGH (ref 38–126)
Anion gap: 10 (ref 5–15)
BUN: 27 mg/dL — ABNORMAL HIGH (ref 8–23)
CO2: 26 mmol/L (ref 22–32)
Calcium: 8.5 mg/dL — ABNORMAL LOW (ref 8.9–10.3)
Chloride: 99 mmol/L (ref 98–111)
Creatinine, Ser: 4.68 mg/dL — ABNORMAL HIGH (ref 0.44–1.00)
GFR, Estimated: 10 mL/min — ABNORMAL LOW (ref >60)
Glucose, Bld: 137 mg/dL — ABNORMAL HIGH (ref 70–99)
Potassium: 3.8 mmol/L (ref 3.5–5.1)
Sodium: 135 mmol/L (ref 135–145)
Total Bilirubin: 0.6 mg/dL (ref 0.3–1.2)
Total Protein: 6 g/dL — ABNORMAL LOW (ref 6.5–8.1)

## 2020-03-21 LAB — LIPASE, BLOOD: Lipase: 16 U/L (ref 11–51)

## 2020-03-21 LAB — CBC WITH DIFFERENTIAL/PLATELET
Abs Immature Granulocytes: 0.01 K/uL (ref 0.00–0.07)
Basophils Absolute: 0 K/uL (ref 0.0–0.1)
Basophils Relative: 0 %
Eosinophils Absolute: 0.1 K/uL (ref 0.0–0.5)
Eosinophils Relative: 1 %
HCT: 23.1 % — ABNORMAL LOW (ref 36.0–46.0)
Hemoglobin: 7.4 g/dL — ABNORMAL LOW (ref 12.0–15.0)
Immature Granulocytes: 0 %
Lymphocytes Relative: 42 %
Lymphs Abs: 1.7 K/uL (ref 0.7–4.0)
MCH: 35.2 pg — ABNORMAL HIGH (ref 26.0–34.0)
MCHC: 32 g/dL (ref 30.0–36.0)
MCV: 110 fL — ABNORMAL HIGH (ref 80.0–100.0)
Monocytes Absolute: 0.5 K/uL (ref 0.1–1.0)
Monocytes Relative: 12 %
Neutro Abs: 1.8 K/uL (ref 1.7–7.7)
Neutrophils Relative %: 45 %
Platelets: 118 K/uL — ABNORMAL LOW (ref 150–400)
RBC: 2.1 MIL/uL — ABNORMAL LOW (ref 3.87–5.11)
RDW: 12.5 % (ref 11.5–15.5)
WBC: 4.1 K/uL (ref 4.0–10.5)
nRBC: 0 % (ref 0.0–0.2)

## 2020-03-21 MED ORDER — ONDANSETRON 8 MG PO TBDP: ORAL_TABLET | ORAL | 0 refills | Status: DC

## 2020-03-21 MED ORDER — ONDANSETRON 8 MG PO TBDP
8.0000 mg | ORAL_TABLET | Freq: Once | ORAL | Status: AC
  Administered 2020-03-21: 8 mg via ORAL
  Filled 2020-03-21: qty 1

## 2020-03-21 NOTE — Discharge Instructions (Addendum)
Take Zofran as prescribed as needed for nausea.  Follow-up with dialysis tomorrow as scheduled, and return to the ER if you develop severe abdominal pain, bloody stools, high fever, or other new and concerning symptoms.

## 2020-03-21 NOTE — ED Notes (Signed)
Contacted pts son for update per pt request

## 2020-05-03 ENCOUNTER — Encounter (HOSPITAL_COMMUNITY): Payer: Self-pay | Admitting: *Deleted

## 2020-05-03 ENCOUNTER — Emergency Department (HOSPITAL_COMMUNITY): Payer: Medicare Other

## 2020-05-03 ENCOUNTER — Emergency Department (HOSPITAL_COMMUNITY)
Admission: EM | Admit: 2020-05-03 | Discharge: 2020-05-03 | Disposition: A | Discharge status: Home / Self Care | Payer: Medicare Other | Attending: Emergency Medicine | Admitting: Emergency Medicine

## 2020-05-03 ENCOUNTER — Other Ambulatory Visit: Payer: Self-pay

## 2020-05-03 DIAGNOSIS — Z992 Dependence on renal dialysis: Secondary | ICD-10-CM | POA: Diagnosis not present

## 2020-05-03 DIAGNOSIS — E1122 Type 2 diabetes mellitus with diabetic chronic kidney disease: Secondary | ICD-10-CM | POA: Insufficient documentation

## 2020-05-03 DIAGNOSIS — Z794 Long term (current) use of insulin: Secondary | ICD-10-CM | POA: Insufficient documentation

## 2020-05-03 DIAGNOSIS — Z79899 Other long term (current) drug therapy: Secondary | ICD-10-CM | POA: Insufficient documentation

## 2020-05-03 DIAGNOSIS — Y92090 Kitchen in other non-institutional residence as the place of occurrence of the external cause: Secondary | ICD-10-CM | POA: Insufficient documentation

## 2020-05-03 DIAGNOSIS — E1110 Type 2 diabetes mellitus with ketoacidosis without coma: Secondary | ICD-10-CM | POA: Insufficient documentation

## 2020-05-03 DIAGNOSIS — E1140 Type 2 diabetes mellitus with diabetic neuropathy, unspecified: Secondary | ICD-10-CM | POA: Diagnosis not present

## 2020-05-03 DIAGNOSIS — S3992XA Unspecified injury of lower back, initial encounter: Secondary | ICD-10-CM | POA: Diagnosis present

## 2020-05-03 DIAGNOSIS — I132 Hypertensive heart and chronic kidney disease with heart failure and with stage 5 chronic kidney disease, or end stage renal disease: Secondary | ICD-10-CM | POA: Diagnosis not present

## 2020-05-03 DIAGNOSIS — Z8673 Personal history of transient ischemic attack (TIA), and cerebral infarction without residual deficits: Secondary | ICD-10-CM | POA: Insufficient documentation

## 2020-05-03 DIAGNOSIS — I5032 Chronic diastolic (congestive) heart failure: Secondary | ICD-10-CM | POA: Diagnosis not present

## 2020-05-03 DIAGNOSIS — W19XXXA Unspecified fall, initial encounter: Secondary | ICD-10-CM | POA: Insufficient documentation

## 2020-05-03 DIAGNOSIS — N186 End stage renal disease: Secondary | ICD-10-CM | POA: Insufficient documentation

## 2020-05-03 DIAGNOSIS — S300XXA Contusion of lower back and pelvis, initial encounter: Secondary | ICD-10-CM | POA: Diagnosis not present

## 2020-05-03 HISTORY — DX: Dependence on renal dialysis: Z99.2

## 2020-05-03 LAB — COMPREHENSIVE METABOLIC PANEL
ALT: 35 U/L (ref 0–44)
AST: 51 U/L — ABNORMAL HIGH (ref 15–41)
Albumin: 2.8 g/dL — ABNORMAL LOW (ref 3.5–5.0)
Alkaline Phosphatase: 126 U/L (ref 38–126)
Anion gap: 11 (ref 5–15)
BUN: 21 mg/dL (ref 8–23)
CO2: 26 mmol/L (ref 22–32)
Calcium: 8.8 mg/dL — ABNORMAL LOW (ref 8.9–10.3)
Chloride: 100 mmol/L (ref 98–111)
Creatinine, Ser: 2.73 mg/dL — ABNORMAL HIGH (ref 0.44–1.00)
GFR, Estimated: 19 mL/min — ABNORMAL LOW (ref >60)
Glucose, Bld: 300 mg/dL — ABNORMAL HIGH (ref 70–99)
Potassium: 3.5 mmol/L (ref 3.5–5.1)
Sodium: 137 mmol/L (ref 135–145)
Total Bilirubin: 0.3 mg/dL (ref 0.3–1.2)
Total Protein: 6.3 g/dL — ABNORMAL LOW (ref 6.5–8.1)

## 2020-05-03 LAB — CBC
HCT: 33.2 % — ABNORMAL LOW (ref 36.0–46.0)
Hemoglobin: 10.3 g/dL — ABNORMAL LOW (ref 12.0–15.0)
MCH: 35.8 pg — ABNORMAL HIGH (ref 26.0–34.0)
MCHC: 31 g/dL (ref 30.0–36.0)
MCV: 115.3 fL — ABNORMAL HIGH (ref 80.0–100.0)
Platelets: 122 10*3/uL — ABNORMAL LOW (ref 150–400)
RBC: 2.88 MIL/uL — ABNORMAL LOW (ref 3.87–5.11)
RDW: 13.1 % (ref 11.5–15.5)
WBC: 6.2 10*3/uL (ref 4.0–10.5)
nRBC: 0 % (ref 0.0–0.2)

## 2020-05-03 LAB — PHENYTOIN LEVEL, TOTAL: Phenytoin Lvl: 4.4 ug/mL — ABNORMAL LOW (ref 10.0–20.0)

## 2020-05-03 MED ORDER — DICLOFENAC SODIUM 1 % EX GEL: 4.0000 g | Freq: Four times a day (QID) | CUTANEOUS | 1 refills | Status: AC

## 2020-05-03 NOTE — ED Provider Notes (Signed)
[facility] EMERGENCY DEPARTMENT Provider Note   CSN: [id number] Arrival date & time: 05/03/20  1434     History Chief Complaint  Patient presents with  . Fall    Erica [name] is a 65 y.o. female.  HPI   This patient is a 65 year old female with a known history of end-stage renal disease on dialysis Tuesdays Thursdays and Saturdays, she last dialyzed yesterday.  History of congestive heart failure with persistent mild edema of the legs, history of diabetes with gastroparesis, history of seizures on Keppra and Dilantin and a history of hypertension and hyperlipidemia.  The patient walks with a walker at baseline.  The patient reports to me that she had a fall that prompted her visit.  Her chief complaint is having pain in her tailbone.  This occurred at approximately 3-1/2 hours ago, she was in the kitchen, using her walker, she tried to open one of the cabinets and when she took her hand off of her walker she lost her balance falling backwards onto her bottom.  She reports having acute onset of pain, she was able to scoot across the floor to get her phone and call her son who came home from work and called the rescue squad to come pick her up.  Vital signs have been unremarkable, she has no complaints of head injury neck pain numbness or weakness, she has no chest pain coughing or shortness of breath, she has no abdomen pain, she has no urinary symptoms as she does not make any significant amount of urine.  She has had some loose stools recently.  There has been normal appetite, she states that she chews tobacco and has continued to do that all morning.  No pain medications given prehospital  Past Medical History:  Diagnosis Date  . Alcohol-induced pancreatitis   . Chronic diarrhea   . Depression   . Diabetes mellitus    fasting blood sugar 110-120s  . Dialysis patient ([place])   . Diastolic CHF ([place])   . DKA (diabetic ketoacidoses)   . Gastroparesis   . GERD (gastroesophageal  reflux disease)   . Heart murmur   . History of kidney stones   . Hyperlipidemia   . Hypertension   . Hypokalemia   . Muscle spasm   . Neuropathic pain   . Neuropathy    Hx: of  . Pyelonephritis   . Seizures ([place])   . Vitamin B12 deficiency   . Vitamin D deficiency     Patient Active Problem List   Diagnosis Date Noted  . Palliative care by specialist   . Generalized weakness 11/14/2019  . HCAP (healthcare-associated pneumonia) 11/14/2019  . Hyperkalemia 11/13/2019  . Hyponatremia 09/27/2019  . Closed fracture of right proximal humerus 04/20/2019  . Surgery, elective   . Seizure ([place]) 11/12/2018  . History of hydronephrosis --stents in place 12/08/2017  . Stroke ([place]) 11/29/2017  . Seizures ([place])   . Hydronephrosis   . Recurrent UTI   . Diabetes mellitus type 2 in nonobese (HCC)   . ESRD (end stage renal disease) ([place])   . Anemia of chronic disease   . Chronic diastolic congestive heart failure ([place])   . Essential hypertension   . PICC (peripherally inserted central catheter) in place   . Acute pyelonephritis 11/12/2017  . Uncontrolled type 2 diabetes mellitus with hyperglycemia, with long-term current use of insulin ([place]) 11/10/2017  . Renal failure 11/08/2017  . Seizure disorder ([place]) 11/08/2017  . Orthostatic hypotension 07/25/2014  .  Moderate protein malnutrition ([place]) 09/15/2013  . Aspiration pneumonia ([place]) 09/15/2013  . Acute respiratory failure requiring reintubation ([place]) 09/11/2013  . probable Seizures due to metabolic disorder [id number]  . Lactic acidosis 03/19/2013  . Abdominal pain 03/19/2013  . Rotavirus infection 10/29/2012  . Type II or unspecified type diabetes mellitus without mention of complication, uncontrolled 10/29/2012  . Protein-calorie malnutrition, severe ([place]) 10/27/2012  . NSTEMI (non-ST elevated myocardial infarction) ([place]) 10/26/2012  . Fever, unspecified 10/26/2012  . Hypotension 10/25/2012  . Metabolic acidosis [id number]  .  Chronic diarrhea 10/25/2012  . Tobacco abuse 10/25/2012  . DKA (diabetic ketoacidoses) 09/09/2012  . Dehydration 09/09/2012  . DKA, type 2 ([place]) 05/20/2012  . Abnormal LFTs 05/20/2012  . Heart murmur, systolic [id number]  . Hypoglycemia 07/08/2011  . Metabolic encephalopathy [id number]  . Alcohol abuse 07/08/2011  . Hypokalemia 07/08/2011  . Nausea & vomiting 07/08/2011  . H/O chronic pancreatitis 07/08/2011    Past Surgical History:  Procedure Laterality Date  . AV FISTULA PLACEMENT Left 12/15/2017   Procedure: INSERTION OF ARTERIOVENOUS (AV) GORE-TEX GRAFT ARM;  Surgeon: Rosetta Posner, MD;  Location: [place];  Service: Vascular;  Laterality: Left;  . CATARACT EXTRACTION W/PHACO Right 03/14/2015   Procedure: CATARACT EXTRACTION PHACO AND INTRAOCULAR LENS PLACEMENT (IOC);  Surgeon: Baruch Goldmann, MD;  Location: [facility];  Service: Ophthalmology;  Laterality: Right;  CDE:11.13  . CATARACT EXTRACTION W/PHACO Left 04/11/2015   Procedure: CATARACT EXTRACTION PHACO AND INTRAOCULAR LENS PLACEMENT LEFT EYE CDE=9.68;  Surgeon: Baruch Goldmann, MD;  Location: [facility];  Service: Ophthalmology;  Laterality: Left;  . COLONOSCOPY  02/24/2010  . cystoscopy with ureteral stent  Bilateral 10/07/2017   At [place] CV LINE RIGHT  11/17/2017  . IR FLUORO GUIDE CV LINE RIGHT  11/22/2017  . IR REMOVAL TUN CV CATH W/O FL  01/26/2018  . IR US GUIDE VASC ACCESS RIGHT  11/17/2017  . MULTIPLE EXTRACTIONS WITH ALVEOLOPLASTY N/A 06/13/2012   Procedure: MULTIPLE EXTRACION WITH ALVEOLOPLASTY EXTRACT: 18, 19, 20, 21, 22, 24, 25, 27, 28, 29, 30, 31;  Surgeon: Gae Bon, DDS;  Location: [place];  Service: Oral Surgery;  Laterality: N/A;  . ORIF HUMERUS FRACTURE Right 04/20/2019   Procedure: OPEN REDUCTION INTERNAL FIXATION (ORIF) PROXIMAL HUMERUS FRACTURE;  Surgeon: Marchia Bond, MD;  Location: [place];  Service: Orthopedics;  Laterality: Right;  . TUBAL LIGATION    . URETERAL STENT PLACEMENT   09/2017     OB History    Gravida      Para      Term      Preterm      AB      Living  2     SAB      IAB      Ectopic      Multiple      Live Births              Family History  Problem Relation Age of Onset  . Diabetes Sister   . Chronic Renal Failure Neg Hx     Social History   Tobacco Use  . Smoking status: Never Smoker  . Smokeless tobacco: Current User    Types: Snuff  Vaping Use  . Vaping Use: Never used  Substance Use Topics  . Alcohol use: Not Currently    Alcohol/week: 1.0 standard drink    Types: 1 Cans of beer per week  . Drug use: No  Home Medications Prior to Admission medications   Medication Sig Start Date End Date Taking? Authorizing Provider  diclofenac Sodium (VOLTAREN) 1 % GEL Apply 4 g topically 4 (four) times daily for 7 days. 05/03/20 05/10/20 Yes Noemi Chapel, MD  acetaminophen (TYLENOL) 325 MG tablet Take 650 mg by mouth every 4 (four) hours as needed for moderate pain.    [provider]  albuterol (PROAIR HFA) 108 (90 Base) MCG/ACT inhaler Inhale 2 puffs into the lungs every 4 (four) hours as needed for wheezing or shortness of breath.     [provider]  calcitRIOL (ROCALTROL) 0.25 MCG capsule Take 1 capsule (0.25 mcg total) by mouth Every Tuesday,Thursday,and Saturday with dialysis. 12/18/17   Love, Ivan Anchors, PA-C  carboxymethylcellulose (ARTIFICIAL TEARS) 1 % ophthalmic solution Place 1 drop into both eyes daily as needed (dry eyes).    [provider]  carvedilol (COREG) 6.25 MG tablet Take 1 tablet (6.25 mg total) by mouth 2 (two) times daily. 11/15/18   Kinnie Feil, MD  cinacalcet (SENSIPAR) 30 MG tablet Take 30 mg by mouth every evening.    [provider]  dextrose (GLUTOSE) 40 % GEL Take 1 Tube by mouth every 10 (ten) minutes as needed for low blood sugar (unitl blood sugar is above 70).    [provider]  Emollient (CETAPHIL) cream Apply 1 application topically  every 12 (twelve) hours as needed (dry skin).    [provider]  ferric citrate (AURYXIA) 1 GM 210 MG(Fe) tablet Take 2 tablets (420 mg total) by mouth 3 (three) times daily with meals. 11/15/18   Kinnie Feil, MD  folic acid (FOLVITE) 1 MG tablet Take 1 mg by mouth every morning.    [provider]  hydrALAZINE (APRESOLINE) 25 MG tablet Take 1 tablet (25 mg total) by mouth every 8 (eight) hours. 11/19/19   Oswald Hillock, MD  insulin lispro (HUMALOG) 100 UNIT/ML injection Inject 2-10 Units into the skin 4 (four) times daily. Per sliding scale: CBG 151-200 2 units, 201-250 4 units, 251-300 6 units, 301-350 8 units, 351-400 10 units,    [provider]  levETIRAcetam (KEPPRA) 1000 MG tablet Take 1,000 mg by mouth every morning.    [provider]  loperamide (IMODIUM) 2 MG capsule Take 4 mg by mouth daily as needed for diarrhea or loose stools.    [provider]  losartan (COZAAR) 25 MG tablet Take 1 tablet (25 mg total) by mouth daily. 11/20/19   Oswald Hillock, MD  metoCLOPramide (REGLAN) 5 MG tablet Take 1 tablet (5 mg total) by mouth every 6 (six) hours as needed for nausea, vomiting or refractory nausea / vomiting. 11/19/19   Oswald Hillock, MD  NIFEdipine (ADALAT CC) 60 MG 24 hr tablet Take 1 tablet (60 mg total) by mouth every morning. 11/20/19   Oswald Hillock, MD  ondansetron (ZOFRAN ODT) 8 MG disintegrating tablet '8mg'$  ODT q4 hours prn nausea 03/21/20   Veryl Speak, MD  pantoprazole (PROTONIX) 40 MG tablet Take 1 tablet (40 mg total) by mouth daily. 11/19/19 01/18/20  Oswald Hillock, MD  phenytoin (DILANTIN INFATABS) 50 MG tablet Chew 6 tablets (300 mg total) by mouth daily. 09/29/19 10/29/19  Manuella Ghazi, Pratik D, DO  polyethylene glycol (MIRALAX) 17 g packet Take 17 g by mouth daily. 08/10/19   Tacy Learn, PA-C  rOPINIRole (REQUIP) 1 MG tablet Take 1 mg by mouth 3 (three) times daily.    [provider]  senna (SENOKOT) 8.6 MG TABS tablet Take 8.6  mg by mouth in the morning and at bedtime.    [provider]  topiramate (TOPAMAX) 25 MG tablet Take 1 tablet (25 mg total) by mouth 2 (two) times daily. 11/29/17 11/12/19  Arrien, Jimmy Picket, MD  Vitamin D, Ergocalciferol, (DRISDOL) 1.25 MG (50000 UT) CAPS capsule Take 50,000 Units by mouth every Friday.    [provider]    Allergies    Aspirin  Review of Systems   Review of Systems  All other systems reviewed and are negative.   Physical Exam Updated Vital Signs BP (!) 179/68   Pulse 74   Temp 98 F (36.7 C) (Oral)   Resp 18   Ht 1.499 m ('4\' 11"'$ )   Wt 49 kg   SpO2 99%   BMI 21.81 kg/m   Physical Exam Vitals and nursing note reviewed.  Constitutional:      General: She is not in acute distress.    Appearance: She is well-developed.  HENT:     Head: Normocephalic and atraumatic.     Mouth/Throat:     Pharynx: No oropharyngeal exudate.     Comments: Tongue is covered in chewing tobacco, pharynx otherwise clear Eyes:     General: No scleral icterus.       Right eye: No discharge.        Left eye: No discharge.     Conjunctiva/sclera: Conjunctivae normal.     Pupils: Pupils are equal, round, and reactive to light.  Neck:     Thyroid: No thyromegaly.     Vascular: No JVD.  Cardiovascular:     Rate and Rhythm: Normal rate and regular rhythm.     Heart sounds: Murmur heard.  No friction rub. No gallop.      Comments: Fistula present in the left upper extremity with a normal thrill, there is no overlying redness or warmth or tenderness. Pulmonary:     Effort: Pulmonary effort is normal. No respiratory distress.     Breath sounds: Normal breath sounds. No wheezing or rales.  Abdominal:     General: Bowel sounds are normal. There is no distension.     Palpations: Abdomen is soft. There is no mass.     Tenderness: There is no abdominal tenderness.  Musculoskeletal:        General: Tenderness present. Normal range of motion.     Cervical back:  Normal range of motion and neck supple.     Right lower leg: Edema present.     Left lower leg: Edema present.     Comments: There is symmetrical 1+ pitting edema from the mid tibia through the foot bilaterally.  She has tenderness to palpation over the sacrum and coccygeal bones of her back, there is no tenderness over the pelvic bones or the hips, she has no tenderness over the lumbar spine, she does appear to have some scoliosis clinically.  Lymphadenopathy:     Cervical: No cervical adenopathy.  Skin:    General: Skin is warm and dry.     Findings: No erythema or rash.  Neurological:     Mental Status: She is alert.     Coordination: Coordination normal.     Comments: Normal strength and sensation in all 4 extremities, she is able to straight leg raise bilaterally, she is able to move both arms with normal grips bilaterally, her mental status is awake and alert and answers all my questions appropriately,  she is oriented to location date of birth 8 name and circumstances  Psychiatric:        Behavior: Behavior normal.     ED Results / Procedures / Treatments   Labs (all labs ordered are listed, but only abnormal results are displayed) Labs Reviewed  PHENYTOIN LEVEL, TOTAL - Abnormal; Notable for the following components:      Result Value   Phenytoin Lvl 4.4 (*)    All other components within normal limits  COMPREHENSIVE METABOLIC PANEL - Abnormal; Notable for the following components:   Glucose, Bld 300 (*)    Creatinine, Ser 2.73 (*)    Calcium 8.8 (*)    Total Protein 6.3 (*)    Albumin 2.8 (*)    AST 51 (*)    GFR, Estimated 19 (*)    All other components within normal limits  CBC - Abnormal; Notable for the following components:   RBC 2.88 (*)    Hemoglobin 10.3 (*)    HCT 33.2 (*)    MCV 115.3 (*)    MCH 35.8 (*)    Platelets 122 (*)    All other components within normal limits    EKG EKG Interpretation  Date/Time:  Friday May 03 2020 15:30:03  EDT Ventricular Rate:  71 PR Interval:    QRS Duration: 70 QT Interval:  364 QTC Calculation: 395 R Axis:   8 Text Interpretation: Normal sinus rhythm Low voltage QRS Nonspecific ST and T wave abnormality Abnormal ECG Confirmed by Noemi Chapel [phone]) on 05/03/2020 3:50:42 PM   Radiology DG Lumbar Spine Complete  Result Date: 05/03/2020 CLINICAL DATA:  Status post fall today. Low back pain. Initial encounter. EXAM: LUMBAR SPINE - COMPLETE 4+ VIEW COMPARISON:  Plain films lumbar spine 12/12/2018 and CT abdomen and pelvis 03/21/2020. FINDINGS: The patient has remote inferior endplate compression fractures of T10 and T12 and remote superior endplate compression fracture of L2. No new fracture is identified. Trace anterolisthesis L4 on L5 is unchanged. Bones are osteopenic. Paraspinous structures demonstrate chronic calcific pancreatitis. IMPRESSION: No acute abnormality. Remote T10, T12 and L2 fractures. Osteopenia. Electronically Signed   By: Inge Rise M.D.   On: 05/03/2020 16:34   DG Pelvis 1-2 Views  Result Date: 05/03/2020 CLINICAL DATA:  Pain after a fall today.  Initial encounter. EXAM: PELVIS - 1-2 VIEW COMPARISON:  None. FINDINGS: There is no evidence of pelvic fracture or diastasis. Bones are osteopenic. Right hip arthroplasty noted. Extensive atherosclerosis is seen. No pelvic bone lesions are seen. IMPRESSION: No acute abnormality. Osteopenia. Atherosclerosis. Electronically Signed   By: Inge Rise M.D.   On: 05/03/2020 16:28   DG Sacrum/Coccyx  Result Date: 05/03/2020 CLINICAL DATA:  Status post fall today.  Pain.  Initial encounter. EXAM: SACRUM AND COCCYX - 2+ VIEW COMPARISON:  CT abdomen and pelvis 03/21/2020. FINDINGS: There is no evidence of fracture or other focal bone lesions. Bones are osteopenic. IMPRESSION: No acute finding. Electronically Signed   By: Inge Rise M.D.   On: 05/03/2020 16:30    Procedures Procedures   Medications Ordered in  ED Medications - No data to display  ED Course  I have reviewed the triage vital signs and the nursing notes.  Pertinent labs & imaging results that were available during my care of the patient were reviewed by me and considered in my medical decision making (see chart for details).  Clinical Course as of 05/04/20 1647  Fri May 03, 2020  1647 Testing is normal -  no fractures (new), but has several old vetrebral fractures - labs reassuring - no seizures here, pt will be given some pain medicine for home -  [BM]    Clinical Course User Index [BM] Noemi Chapel, MD   MDM Rules/Calculators/A&P                          This patient has had a mechanical fall, she does not think she had a seizure and she remembers everything that happened.  At this time the patient does not appear to be postictal, she does not appear to be altered at all, she is following commands but has pain in her back that is limiting much of her movement.  At this time I will obtain imaging of her lumbar sacral and coccygeal spines, check Dilantin level and basic labs, she thinks that she went to dialysis yesterday will confirm that she is not hyperkalemic, the patient is agreeable  ambulated prior to d/c   Final Clinical Impression(s) / ED Diagnoses Final diagnoses:  Contusion of sacrum, initial encounter  Fall, initial encounter    Rx / DC Orders ED Discharge Orders         Ordered    diclofenac Sodium (VOLTAREN) 1 % GEL  4 times daily        05/03/20 1653           Noemi Chapel, MD 05/04/20 1647

## 2020-05-03 NOTE — Discharge Instructions (Signed)
Your testing shows no new broken bones in your back It shows that a long time ago you have had some broken bones in your back (not your tailbone) Your tailbone xrays are normal  Your blood work is reassuring  Apply the topical voltaren gel to the areas that hurt every 6 hours as needed  See your doctor in 2-3 days for a recheck

## 2020-05-03 NOTE — ED Notes (Signed)
Pt ambulated approx 20 ft with walker.

## 2020-05-03 NOTE — ED Triage Notes (Addendum)
Pt brought in by [facility] with c/o fall this morning. EMS reports pt fell on her bottom and has "tailbone" pain. Denies hitting her head. Pt is diabetic but hasn't taken her insulin this morning, CBG 362 for EMS. EMS reports pt started getting confused on her way here to the hospital. Pt alert to self, place and situation, disoriented to time only slightly.

## 2020-06-07 ENCOUNTER — Other Ambulatory Visit: Payer: Self-pay

## 2020-06-07 ENCOUNTER — Inpatient Hospital Stay (HOSPITAL_COMMUNITY)
Admission: EM | Admit: 2020-06-07 | Discharge: 2020-06-12 | DRG: 551 | Disposition: A | Discharge status: Skilled Nursing Facility | Payer: Medicare Other | Attending: Internal Medicine | Admitting: Internal Medicine

## 2020-06-07 ENCOUNTER — Encounter (HOSPITAL_COMMUNITY): Payer: Self-pay | Admitting: *Deleted

## 2020-06-07 ENCOUNTER — Emergency Department (HOSPITAL_COMMUNITY): Payer: Medicare Other

## 2020-06-07 DIAGNOSIS — R739 Hyperglycemia, unspecified: Secondary | ICD-10-CM

## 2020-06-07 DIAGNOSIS — I132 Hypertensive heart and chronic kidney disease with heart failure and with stage 5 chronic kidney disease, or end stage renal disease: Secondary | ICD-10-CM | POA: Diagnosis present

## 2020-06-07 DIAGNOSIS — Z833 Family history of diabetes mellitus: Secondary | ICD-10-CM

## 2020-06-07 DIAGNOSIS — J189 Pneumonia, unspecified organism: Secondary | ICD-10-CM | POA: Diagnosis present

## 2020-06-07 DIAGNOSIS — N186 End stage renal disease: Secondary | ICD-10-CM | POA: Diagnosis present

## 2020-06-07 DIAGNOSIS — L8990 Pressure ulcer of unspecified site, unspecified stage: Secondary | ICD-10-CM | POA: Insufficient documentation

## 2020-06-07 DIAGNOSIS — S3210XA Unspecified fracture of sacrum, initial encounter for closed fracture: Secondary | ICD-10-CM | POA: Diagnosis not present

## 2020-06-07 DIAGNOSIS — Z9115 Patient's noncompliance with renal dialysis: Secondary | ICD-10-CM

## 2020-06-07 DIAGNOSIS — D696 Thrombocytopenia, unspecified: Secondary | ICD-10-CM

## 2020-06-07 DIAGNOSIS — Z8673 Personal history of transient ischemic attack (TIA), and cerebral infarction without residual deficits: Secondary | ICD-10-CM

## 2020-06-07 DIAGNOSIS — E8770 Fluid overload, unspecified: Secondary | ICD-10-CM | POA: Diagnosis present

## 2020-06-07 DIAGNOSIS — E1142 Type 2 diabetes mellitus with diabetic polyneuropathy: Secondary | ICD-10-CM | POA: Diagnosis present

## 2020-06-07 DIAGNOSIS — L89151 Pressure ulcer of sacral region, stage 1: Secondary | ICD-10-CM | POA: Diagnosis present

## 2020-06-07 DIAGNOSIS — R188 Other ascites: Secondary | ICD-10-CM | POA: Diagnosis present

## 2020-06-07 DIAGNOSIS — Y92009 Unspecified place in unspecified non-institutional (private) residence as the place of occurrence of the external cause: Secondary | ICD-10-CM

## 2020-06-07 DIAGNOSIS — W19XXXA Unspecified fall, initial encounter: Secondary | ICD-10-CM

## 2020-06-07 DIAGNOSIS — Z992 Dependence on renal dialysis: Secondary | ICD-10-CM

## 2020-06-07 DIAGNOSIS — Z79899 Other long term (current) drug therapy: Secondary | ICD-10-CM

## 2020-06-07 DIAGNOSIS — N2581 Secondary hyperparathyroidism of renal origin: Secondary | ICD-10-CM | POA: Diagnosis present

## 2020-06-07 DIAGNOSIS — Z886 Allergy status to analgesic agent status: Secondary | ICD-10-CM

## 2020-06-07 DIAGNOSIS — E1165 Type 2 diabetes mellitus with hyperglycemia: Secondary | ICD-10-CM | POA: Diagnosis present

## 2020-06-07 DIAGNOSIS — I160 Hypertensive urgency: Secondary | ICD-10-CM

## 2020-06-07 DIAGNOSIS — S300XXA Contusion of lower back and pelvis, initial encounter: Secondary | ICD-10-CM | POA: Diagnosis present

## 2020-06-07 DIAGNOSIS — Z635 Disruption of family by separation and divorce: Secondary | ICD-10-CM

## 2020-06-07 DIAGNOSIS — E1122 Type 2 diabetes mellitus with diabetic chronic kidney disease: Secondary | ICD-10-CM | POA: Diagnosis present

## 2020-06-07 DIAGNOSIS — R718 Other abnormality of red blood cells: Secondary | ICD-10-CM

## 2020-06-07 DIAGNOSIS — I5032 Chronic diastolic (congestive) heart failure: Secondary | ICD-10-CM | POA: Diagnosis present

## 2020-06-07 DIAGNOSIS — L8996 Pressure-induced deep tissue damage of unspecified site: Secondary | ICD-10-CM | POA: Insufficient documentation

## 2020-06-07 DIAGNOSIS — J440 Chronic obstructive pulmonary disease with acute lower respiratory infection: Secondary | ICD-10-CM | POA: Diagnosis present

## 2020-06-07 DIAGNOSIS — G40909 Epilepsy, unspecified, not intractable, without status epilepticus: Secondary | ICD-10-CM | POA: Diagnosis present

## 2020-06-07 DIAGNOSIS — Z794 Long term (current) use of insulin: Secondary | ICD-10-CM

## 2020-06-07 DIAGNOSIS — K219 Gastro-esophageal reflux disease without esophagitis: Secondary | ICD-10-CM | POA: Diagnosis present

## 2020-06-07 DIAGNOSIS — F32A Depression, unspecified: Secondary | ICD-10-CM | POA: Diagnosis present

## 2020-06-07 DIAGNOSIS — I5030 Unspecified diastolic (congestive) heart failure: Secondary | ICD-10-CM

## 2020-06-07 DIAGNOSIS — D631 Anemia in chronic kidney disease: Secondary | ICD-10-CM | POA: Diagnosis present

## 2020-06-07 DIAGNOSIS — W010XXA Fall on same level from slipping, tripping and stumbling without subsequent striking against object, initial encounter: Secondary | ICD-10-CM | POA: Diagnosis present

## 2020-06-07 DIAGNOSIS — I10 Essential (primary) hypertension: Secondary | ICD-10-CM | POA: Diagnosis present

## 2020-06-07 DIAGNOSIS — Z20822 Contact with and (suspected) exposure to covid-19: Secondary | ICD-10-CM | POA: Diagnosis present

## 2020-06-07 LAB — CBC WITH DIFFERENTIAL/PLATELET
Abs Immature Granulocytes: 0.02 10*3/uL (ref 0.00–0.07)
Basophils Absolute: 0 10*3/uL (ref 0.0–0.1)
Basophils Relative: 0 %
Eosinophils Absolute: 0.1 10*3/uL (ref 0.0–0.5)
Eosinophils Relative: 2 %
HCT: 38.3 % (ref 36.0–46.0)
Hemoglobin: 12.1 g/dL (ref 12.0–15.0)
Immature Granulocytes: 0 %
Lymphocytes Relative: 14 %
Lymphs Abs: 0.8 10*3/uL (ref 0.7–4.0)
MCH: 34.6 pg — ABNORMAL HIGH (ref 26.0–34.0)
MCHC: 31.6 g/dL (ref 30.0–36.0)
MCV: 109.4 fL — ABNORMAL HIGH (ref 80.0–100.0)
Monocytes Absolute: 0.5 10*3/uL (ref 0.1–1.0)
Monocytes Relative: 10 %
Neutro Abs: 3.9 10*3/uL (ref 1.7–7.7)
Neutrophils Relative %: 74 %
Platelets: 115 10*3/uL — ABNORMAL LOW (ref 150–400)
RBC: 3.5 MIL/uL — ABNORMAL LOW (ref 3.87–5.11)
RDW: 12.7 % (ref 11.5–15.5)
WBC: 5.4 10*3/uL (ref 4.0–10.5)
nRBC: 0 % (ref 0.0–0.2)

## 2020-06-07 LAB — BASIC METABOLIC PANEL
Anion gap: 13 (ref 5–15)
BUN: 68 mg/dL — ABNORMAL HIGH (ref 8–23)
CO2: 22 mmol/L (ref 22–32)
Calcium: 8.9 mg/dL (ref 8.9–10.3)
Chloride: 95 mmol/L — ABNORMAL LOW (ref 98–111)
Creatinine, Ser: 5.63 mg/dL — ABNORMAL HIGH (ref 0.44–1.00)
GFR, Estimated: 8 mL/min — ABNORMAL LOW (ref >60)
Glucose, Bld: 554 mg/dL (ref 70–99)
Potassium: 4.9 mmol/L (ref 3.5–5.1)
Sodium: 130 mmol/L — ABNORMAL LOW (ref 135–145)

## 2020-06-07 LAB — CBG MONITORING, ED
Glucose-Capillary: 251 mg/dL — ABNORMAL HIGH (ref 70–99)
Glucose-Capillary: 536 mg/dL (ref 70–99)

## 2020-06-07 MED ORDER — ACETAMINOPHEN 325 MG PO TABS
650.0000 mg | ORAL_TABLET | Freq: Once | ORAL | Status: AC
  Administered 2020-06-07: 650 mg via ORAL
  Filled 2020-06-07: qty 2

## 2020-06-07 MED ORDER — METOCLOPRAMIDE HCL 10 MG PO TABS: 5.0000 mg | ORAL_TABLET | Freq: Four times a day (QID) | ORAL | Status: DC | PRN

## 2020-06-07 MED ORDER — ROPINIROLE HCL 1 MG PO TABS
1.0000 mg | ORAL_TABLET | Freq: Three times a day (TID) | ORAL | Status: DC
  Administered 2020-06-08 – 2020-06-11 (×12): 1 mg via ORAL
  Filled 2020-06-07 (×18): qty 1

## 2020-06-07 MED ORDER — INSULIN ASPART 100 UNIT/ML ~~LOC~~ SOLN
0.0000 [IU] | Freq: Three times a day (TID) | SUBCUTANEOUS | Status: DC
Start: 2020-06-08 — End: 2020-06-12
  Administered 2020-06-09: 1 [IU] via SUBCUTANEOUS
  Administered 2020-06-10 (×3): 4 [IU] via SUBCUTANEOUS
  Administered 2020-06-11: 1 [IU] via SUBCUTANEOUS

## 2020-06-07 MED ORDER — INSULIN ASPART 100 UNIT/ML ~~LOC~~ SOLN
15.0000 [IU] | Freq: Once | SUBCUTANEOUS | Status: AC
  Administered 2020-06-07: 15 [IU] via SUBCUTANEOUS
  Filled 2020-06-07: qty 1

## 2020-06-07 MED ORDER — LEVETIRACETAM 500 MG PO TABS
1000.0000 mg | ORAL_TABLET | Freq: Every morning | ORAL | Status: DC
  Administered 2020-06-08 – 2020-06-11 (×4): 1000 mg via ORAL
  Filled 2020-06-07 (×5): qty 2

## 2020-06-07 MED ORDER — HYDRALAZINE HCL 25 MG PO TABS
25.0000 mg | ORAL_TABLET | Freq: Three times a day (TID) | ORAL | Status: DC
  Administered 2020-06-08 – 2020-06-12 (×13): 25 mg via ORAL
  Filled 2020-06-07 (×13): qty 1

## 2020-06-07 MED ORDER — CARVEDILOL 3.125 MG PO TABS
6.2500 mg | ORAL_TABLET | Freq: Two times a day (BID) | ORAL | Status: DC
  Administered 2020-06-08 – 2020-06-11 (×8): 6.25 mg via ORAL
  Filled 2020-06-07 (×8): qty 2

## 2020-06-07 MED ORDER — FOLIC ACID 1 MG PO TABS
1.0000 mg | ORAL_TABLET | Freq: Every morning | ORAL | Status: DC
  Administered 2020-06-08 – 2020-06-11 (×4): 1 mg via ORAL
  Filled 2020-06-07 (×4): qty 1

## 2020-06-07 MED ORDER — CALCITRIOL 0.25 MCG PO CAPS
0.2500 ug | ORAL_CAPSULE | ORAL | Status: DC
  Administered 2020-06-08 – 2020-06-11 (×2): 0.25 ug via ORAL
  Filled 2020-06-07 (×2): qty 1

## 2020-06-07 MED ORDER — POLYETHYLENE GLYCOL 3350 17 G PO PACK
17.0000 g | PACK | Freq: Every day | ORAL | Status: DC
  Administered 2020-06-09 – 2020-06-11 (×2): 17 g via ORAL
  Filled 2020-06-07 (×3): qty 1

## 2020-06-07 MED ORDER — ALBUTEROL SULFATE HFA 108 (90 BASE) MCG/ACT IN AERS: 2.0000 | INHALATION_SPRAY | RESPIRATORY_TRACT | Status: DC | PRN

## 2020-06-07 MED ORDER — INSULIN ASPART 100 UNIT/ML IV SOLN: 5.0000 [IU] | Freq: Once | INTRAVENOUS | Status: DC

## 2020-06-07 MED ORDER — ONDANSETRON 4 MG PO TBDP
4.0000 mg | ORAL_TABLET | Freq: Three times a day (TID) | ORAL | Status: DC | PRN
  Administered 2020-06-09: 4 mg via ORAL
  Filled 2020-06-07: qty 1

## 2020-06-07 MED ORDER — FERRIC CITRATE 1 GM 210 MG(FE) PO TABS
420.0000 mg | ORAL_TABLET | Freq: Three times a day (TID) | ORAL | Status: DC
  Administered 2020-06-08 – 2020-06-11 (×11): 420 mg via ORAL
  Filled 2020-06-07 (×20): qty 2

## 2020-06-07 MED ORDER — LOSARTAN POTASSIUM 50 MG PO TABS
25.0000 mg | ORAL_TABLET | Freq: Every day | ORAL | Status: DC
  Administered 2020-06-09 – 2020-06-11 (×3): 25 mg via ORAL
  Filled 2020-06-07 (×3): qty 1

## 2020-06-07 MED ORDER — POLYVINYL ALCOHOL 1.4 % OP SOLN
1.0000 [drp] | Freq: Two times a day (BID) | OPHTHALMIC | Status: DC
  Administered 2020-06-08 – 2020-06-11 (×9): 1 [drp] via OPHTHALMIC
  Filled 2020-06-07 (×2): qty 15

## 2020-06-07 MED ORDER — NIFEDIPINE ER OSMOTIC RELEASE 30 MG PO TB24
60.0000 mg | ORAL_TABLET | Freq: Every morning | ORAL | Status: DC
  Administered 2020-06-10 – 2020-06-11 (×2): 60 mg via ORAL
  Filled 2020-06-07 (×3): qty 2

## 2020-06-07 MED ORDER — ACETAMINOPHEN 325 MG PO TABS: 650.0000 mg | ORAL_TABLET | ORAL | Status: DC | PRN

## 2020-06-07 MED ORDER — INSULIN ASPART 100 UNIT/ML ~~LOC~~ SOLN
2.0000 [IU] | Freq: Three times a day (TID) | SUBCUTANEOUS | Status: DC
  Administered 2020-06-08 – 2020-06-09 (×4): 2 [IU] via SUBCUTANEOUS

## 2020-06-07 NOTE — ED Notes (Signed)
Pt to CT

## 2020-06-07 NOTE — ED Provider Notes (Signed)
[facility] EMERGENCY DEPARTMENT Provider Note   CSN: [id number] Arrival date & time: 06/07/20  1621     History Chief Complaint  Patient presents with  . Fall    Erica [name] is a 65 y.o. female.  Presents with fall today.  She states that she lost her balance and fell prior to arrival.  She states that she uses a walker typically but it slipped out from her.  Denies head injury or loss of consciousness.  Complaining of left-sided headache and left hip pain.  Stating that she is having difficulty getting up.  Otherwise denies fevers vomiting cough or diarrhea.  She is due for dialysis 2 days ago but missed it.  She is due for dialysis tomorrow.        Past Medical History:  Diagnosis Date  . Alcohol-induced pancreatitis   . Chronic diarrhea   . Depression   . Diabetes mellitus    fasting blood sugar 110-120s  . Dialysis patient ([place])   . Diastolic CHF ([place])   . DKA (diabetic ketoacidoses)   . Gastroparesis   . GERD (gastroesophageal reflux disease)   . Heart murmur   . History of kidney stones   . Hyperlipidemia   . Hypertension   . Hypokalemia   . Muscle spasm   . Neuropathic pain   . Neuropathy    Hx: of  . Pyelonephritis   . Seizures ([place])   . Vitamin B12 deficiency   . Vitamin D deficiency     Patient Active Problem List   Diagnosis Date Noted  . Palliative care by specialist   . Generalized weakness 11/14/2019  . HCAP (healthcare-associated pneumonia) 11/14/2019  . Hyperkalemia 11/13/2019  . Hyponatremia 09/27/2019  . Closed fracture of right proximal humerus 04/20/2019  . Surgery, elective   . Seizure ([place]) 11/12/2018  . History of hydronephrosis --stents in place 12/08/2017  . Stroke ([place]) 11/29/2017  . Seizures ([place])   . Hydronephrosis   . Recurrent UTI   . Diabetes mellitus type 2 in nonobese (HCC)   . ESRD (end stage renal disease) ([place])   . Anemia of chronic disease   . Chronic diastolic congestive heart failure ([place])   .  Essential hypertension   . PICC (peripherally inserted central catheter) in place   . Acute pyelonephritis 11/12/2017  . Uncontrolled type 2 diabetes mellitus with hyperglycemia, with long-term current use of insulin ([place]) 11/10/2017  . Renal failure 11/08/2017  . Seizure disorder ([place]) 11/08/2017  . Orthostatic hypotension 07/25/2014  . Moderate protein malnutrition ([place]) 09/15/2013  . Aspiration pneumonia ([place]) 09/15/2013  . Acute respiratory failure requiring reintubation ([place]) 09/11/2013  . probable Seizures due to metabolic disorder [id number]  . Lactic acidosis 03/19/2013  . Abdominal pain 03/19/2013  . Rotavirus infection 10/29/2012  . Type II or unspecified type diabetes mellitus without mention of complication, uncontrolled 10/29/2012  . Protein-calorie malnutrition, severe ([place]) 10/27/2012  . NSTEMI (non-ST elevated myocardial infarction) ([place]) 10/26/2012  . Fever, unspecified 10/26/2012  . Hypotension 10/25/2012  . Metabolic acidosis [id number]  . Chronic diarrhea 10/25/2012  . Tobacco abuse 10/25/2012  . DKA (diabetic ketoacidoses) 09/09/2012  . Dehydration 09/09/2012  . DKA, type 2 ([place]) 05/20/2012  . Abnormal LFTs 05/20/2012  . Heart murmur, systolic [id number]  . Hypoglycemia 07/08/2011  . Metabolic encephalopathy [id number]  . Alcohol abuse 07/08/2011  . Hypokalemia 07/08/2011  . Nausea & vomiting 07/08/2011  . H/O chronic pancreatitis 07/08/2011    Past Surgical History:  Procedure Laterality  Date  . AV FISTULA PLACEMENT Left 12/15/2017   Procedure: INSERTION OF ARTERIOVENOUS (AV) GORE-TEX GRAFT ARM;  Surgeon: Rosetta Posner, MD;  Location: [place];  Service: Vascular;  Laterality: Left;  . CATARACT EXTRACTION W/PHACO Right 03/14/2015   Procedure: CATARACT EXTRACTION PHACO AND INTRAOCULAR LENS PLACEMENT (IOC);  Surgeon: Baruch Goldmann, MD;  Location: [facility];  Service: Ophthalmology;  Laterality: Right;  CDE:11.13  . CATARACT EXTRACTION W/PHACO Left 04/11/2015    Procedure: CATARACT EXTRACTION PHACO AND INTRAOCULAR LENS PLACEMENT LEFT EYE CDE=9.68;  Surgeon: Baruch Goldmann, MD;  Location: [facility];  Service: Ophthalmology;  Laterality: Left;  . COLONOSCOPY  02/24/2010  . cystoscopy with ureteral stent  Bilateral 10/07/2017   At [place] CV LINE RIGHT  11/17/2017  . IR FLUORO GUIDE CV LINE RIGHT  11/22/2017  . IR REMOVAL TUN CV CATH W/O FL  01/26/2018  . IR US GUIDE VASC ACCESS RIGHT  11/17/2017  . MULTIPLE EXTRACTIONS WITH ALVEOLOPLASTY N/A 06/13/2012   Procedure: MULTIPLE EXTRACION WITH ALVEOLOPLASTY EXTRACT: 18, 19, 20, 21, 22, 24, 25, 27, 28, 29, 30, 31;  Surgeon: Gae Bon, DDS;  Location: [place];  Service: Oral Surgery;  Laterality: N/A;  . ORIF HUMERUS FRACTURE Right 04/20/2019   Procedure: OPEN REDUCTION INTERNAL FIXATION (ORIF) PROXIMAL HUMERUS FRACTURE;  Surgeon: Marchia Bond, MD;  Location: [place];  Service: Orthopedics;  Laterality: Right;  . TUBAL LIGATION    . URETERAL STENT PLACEMENT  09/2017     OB History    Gravida      Para      Term      Preterm      AB      Living  2     SAB      IAB      Ectopic      Multiple      Live Births              Family History  Problem Relation Age of Onset  . Diabetes Sister   . Chronic Renal Failure Neg Hx     Social History   Tobacco Use  . Smoking status: Never Smoker  . Smokeless tobacco: Current User    Types: Snuff  Vaping Use  . Vaping Use: Never used  Substance Use Topics  . Alcohol use: Not Currently    Alcohol/week: 1.0 standard drink    Types: 1 Cans of beer per week  . Drug use: No    Home Medications Prior to Admission medications   Medication Sig Start Date End Date Taking? Authorizing Provider  acetaminophen (TYLENOL) 325 MG tablet Take 650 mg by mouth every 4 (four) hours as needed for moderate pain.    [provider]  albuterol (PROAIR HFA) 108 (90 Base) MCG/ACT inhaler Inhale 2 puffs into the lungs every 4  (four) hours as needed for wheezing or shortness of breath.     [provider]  calcitRIOL (ROCALTROL) 0.25 MCG capsule Take 1 capsule (0.25 mcg total) by mouth Every Tuesday,Thursday,and Saturday with dialysis. 12/18/17   Love, Ivan Anchors, PA-C  carboxymethylcellulose (ARTIFICIAL TEARS) 1 % ophthalmic solution Place 1 drop into both eyes daily as needed (dry eyes).    [provider]  carvedilol (COREG) 6.25 MG tablet Take 1 tablet (6.25 mg total) by mouth 2 (two) times daily. 11/15/18   Kinnie Feil, MD  cinacalcet (SENSIPAR) 30 MG tablet Take 30 mg by mouth every evening.  [provider]  dextrose (GLUTOSE) 40 % GEL Take 1 Tube by mouth every 10 (ten) minutes as needed for low blood sugar (unitl blood sugar is above 70).    [provider]  Emollient (CETAPHIL) cream Apply 1 application topically every 12 (twelve) hours as needed (dry skin).    [provider]  ferric citrate (AURYXIA) 1 GM 210 MG(Fe) tablet Take 2 tablets (420 mg total) by mouth 3 (three) times daily with meals. 11/15/18   Kinnie Feil, MD  folic acid (FOLVITE) 1 MG tablet Take 1 mg by mouth every morning.    [provider]  hydrALAZINE (APRESOLINE) 25 MG tablet Take 1 tablet (25 mg total) by mouth every 8 (eight) hours. 11/19/19   Oswald Hillock, MD  insulin lispro (HUMALOG) 100 UNIT/ML injection Inject 2-10 Units into the skin 4 (four) times daily. Per sliding scale: CBG 151-200 2 units, 201-250 4 units, 251-300 6 units, 301-350 8 units, 351-400 10 units,    [provider]  levETIRAcetam (KEPPRA) 1000 MG tablet Take 1,000 mg by mouth every morning.    [provider]  loperamide (IMODIUM) 2 MG capsule Take 4 mg by mouth daily as needed for diarrhea or loose stools.    [provider]  losartan (COZAAR) 25 MG tablet Take 1 tablet (25 mg total) by mouth daily. 11/20/19   Oswald Hillock, MD  metoCLOPramide (REGLAN) 5 MG tablet Take 1 tablet (5 mg  total) by mouth every 6 (six) hours as needed for nausea, vomiting or refractory nausea / vomiting. 11/19/19   Oswald Hillock, MD  NIFEdipine (ADALAT CC) 60 MG 24 hr tablet Take 1 tablet (60 mg total) by mouth every morning. 11/20/19   Oswald Hillock, MD  ondansetron (ZOFRAN ODT) 8 MG disintegrating tablet '8mg'$  ODT q4 hours prn nausea 03/21/20   Veryl Speak, MD  pantoprazole (PROTONIX) 40 MG tablet Take 1 tablet (40 mg total) by mouth daily. 11/19/19 01/18/20  Oswald Hillock, MD  phenytoin (DILANTIN INFATABS) 50 MG tablet Chew 6 tablets (300 mg total) by mouth daily. 09/29/19 10/29/19  Manuella Ghazi, Pratik D, DO  polyethylene glycol (MIRALAX) 17 g packet Take 17 g by mouth daily. 08/10/19   Tacy Learn, PA-C  rOPINIRole (REQUIP) 1 MG tablet Take 1 mg by mouth 3 (three) times daily.    [provider]  senna (SENOKOT) 8.6 MG TABS tablet Take 8.6 mg by mouth in the morning and at bedtime.    [provider]  topiramate (TOPAMAX) 25 MG tablet Take 1 tablet (25 mg total) by mouth 2 (two) times daily. 11/29/17 11/12/19  Arrien, Jimmy Picket, MD  Vitamin D, Ergocalciferol, (DRISDOL) 1.25 MG (50000 UT) CAPS capsule Take 50,000 Units by mouth every Friday.    [provider]    Allergies    Aspirin  Review of Systems   Review of Systems  Constitutional: Negative for fever.  HENT: Negative for ear pain.   Eyes: Negative for pain.  Respiratory: Negative for cough.   Cardiovascular: Negative for chest pain.  Gastrointestinal: Negative for abdominal pain.  Genitourinary: Negative for flank pain.  Musculoskeletal: Negative for back pain.  Skin: Negative for rash.  Neurological: Positive for headaches.    Physical Exam Updated Vital Signs BP (!) 161/65   Pulse 81   Temp 99.3 F (37.4 C) (Oral)   Resp 16   Ht '4\' 11"'$  (1.499 m)   Wt 49 kg   SpO2 96%  BMI 21.81 kg/m   Physical Exam Constitutional:      General: She is not in acute distress.    Appearance: Normal appearance.   HENT:     Head: Normocephalic.     Nose: Nose normal.  Eyes:     Extraocular Movements: Extraocular movements intact.  Cardiovascular:     Rate and Rhythm: Normal rate.  Pulmonary:     Effort: Pulmonary effort is normal.  Musculoskeletal:        General: Normal range of motion.     Cervical back: Normal range of motion.  Neurological:     General: No focal deficit present.     Mental Status: She is alert and oriented to person, place, and time. Mental status is at baseline.     Cranial Nerves: No cranial nerve deficit.     Motor: No weakness.     ED Results / Procedures / Treatments   Labs (all labs ordered are listed, but only abnormal results are displayed) Labs Reviewed  CBC WITH DIFFERENTIAL/PLATELET - Abnormal; Notable for the following components:      Result Value   RBC 3.50 (*)    MCV 109.4 (*)    MCH 34.6 (*)    Platelets 115 (*)    All other components within normal limits  BASIC METABOLIC PANEL - Abnormal; Notable for the following components:   Sodium 130 (*)    Chloride 95 (*)    Glucose, Bld 554 (*)    BUN 68 (*)    Creatinine, Ser 5.63 (*)    GFR, Estimated 8 (*)    All other components within normal limits    EKG EKG Interpretation  Date/Time:  Friday June 07 2020 16:49:43 EDT Ventricular Rate:  77 PR Interval:  169 QRS Duration: 87 QT Interval:  410 QTC Calculation: 464 R Axis:   11 Text Interpretation: Sinus rhythm Low voltage, extremity and precordial leads Confirmed by Thamas Jaegers ([id number]) on 06/07/2020 4:50:59 PM   Radiology CT Head Wo Contrast  Result Date: 06/07/2020 CLINICAL DATA:  Fall with head trauma EXAM: CT HEAD WITHOUT CONTRAST TECHNIQUE: Contiguous axial images were obtained from the base of the skull through the vertex without intravenous contrast. COMPARISON:  Head CT December 12, 2018 FINDINGS: Brain: No evidence of acute large vascular territory infarction, hemorrhage, hydrocephalus, extra-axial collection or mass lesion/mass  effect. Moderate age related cortical atrophy. Similar mild burden of chronic ischemic white matter disease. Vascular: No hyperdense vessel. Atherosclerotic calcifications of the internal carotid arteries at the skull base. Skull: Hyperostosis frontalis. Negative for fracture or focal lesion. Sinuses/Orbits: The paranasal sinuses and mastoid air cells are predominantly clear. Other: None IMPRESSION: 1. No acute intracranial findings. 2. Similar moderate age related cortical atrophy and mild chronic ischemic white matter disease. Electronically Signed   By: Dahlia Bailiff MD   On: 06/07/2020 17:55   CT PELVIS WO CONTRAST  Result Date: 06/07/2020 CLINICAL DATA:  Pelvic pain after fall EXAM: CT PELVIS WITHOUT CONTRAST TECHNIQUE: Multidetector CT imaging of the pelvis was performed following the standard protocol without intravenous contrast. COMPARISON:  05/03/2020, 03/21/2020 FINDINGS: Urinary Tract: Urinary bladder obscured by metallic streak artifact from patient's hip prosthesis. Bowel: Large rectal stool ball. No bowel obstruction or evidence of acute bowel inflammation. Vascular/Lymphatic: Extensive vascular calcifications. No pelvic or inguinal lymphadenopathy. Reproductive:  No acute abnormality. Other: Moderate volume ascites seen within the pelvis. Diffuse anasarca. Musculoskeletal: Subacute transversely oriented nondisplaced fracture through the sacrum at the S4 level (series 6,  images 10 9-112), new from prior CT. SI joints and pubic symphysis intact without diastasis. Left hip is intact without fracture or dislocation. Partially visualized right hip arthroplasty hardware without apparent complication. IMPRESSION: 1. Subacute appearing transversely oriented nondisplaced fracture through the sacrum at the S4 level, new from prior CT. 2. Moderate volume ascites within the pelvis. Diffuse anasarca. 3. Large rectal stool ball. Electronically Signed   By: Davina Poke D.O.   On: 06/07/2020 18:13     Procedures .Critical Care E&M Performed by: Luna Fuse, MD  Critical care provider statement:    Critical care time (minutes):  30   Critical care time was exclusive of:  Separately billable procedures and treating other patients   Critical care was necessary to treat or prevent imminent or life-threatening deterioration of the following conditions:  Metabolic crisis and endocrine crisis After initial E/M assessment, critical care services were subsequently performed that were exclusive of separately billable procedures or treatment.       Medications Ordered in ED Medications  insulin aspart (novoLOG) injection 5 Units (has no administration in time range)  acetaminophen (TYLENOL) tablet 650 mg (650 mg Oral Given 06/07/20 1656)    ED Course  I have reviewed the triage vital signs and the nursing notes.  Pertinent labs & imaging results that were available during my care of the patient were reviewed by me and considered in my medical decision making (see chart for details).    MDM Rules/Calculators/A&P                          Labs were sent showing hyperglycemia.  Patient given IV insulin with improvement of blood sugars.  No evidence of DKA noted.  CT scan of the brain shows no acute pathology.  CT of the pelvis shows subacute sacral fracture.  Patient unable to weight-bear secondary to pain.  Given her generalized weakness and sacral fracture, social worker consulted for nursing home placement.   Final Clinical Impression(s) / ED Diagnoses Final diagnoses:  Hyperglycemia  Closed fracture of sacrum, unspecified portion of sacrum, initial encounter [facility])    Rx / [place] Orders ED Discharge Orders    None       Luna Fuse, MD 06/07/20 1930

## 2020-06-07 NOTE — TOC Initial Note (Signed)
Transition of Care [facility]) - Initial/Assessment Note    Patient Details  Name: Erica [name] MRN: [id number] Date of Birth: [birth year]  Transition of [facility]) CM/SW Contact:    Iona Beard, [place] Phone Number: 06/07/2020, 7:31 PM  Clinical Narrative:                 [facility] consulted for SNF placement. CSW met with pt in ED to complete assessment. Pt states that her son lives with her. Pt states that she has a potty chair in her room to use as she needs assistance with ADLs. Pt states that her niece will assist with bathing. Pt states that she has not had [facility] services in the past. Pt has a cane, rollator, 3N1, and wheelchair to use when needed. Pt states she would be agreeable to SNF if recommended. Pt has been to [facility] for SNF in the past, states she would be interested in this again. CSW to start Fl2 and send out pt referral after PT has made recommendation. TOC to follow.   Expected Discharge Plan: Skilled Nursing Facility Barriers to Discharge: SNF Pending bed offer   Patient Goals and CMS Choice Patient states their goals for this hospitalization and ongoing recovery are:: Go to SNF if recommeneded CMS Medicare.gov Compare Post Acute Care list provided to:: Patient Choice offered to / list presented to : Patient  Expected Discharge Plan and Services Expected Discharge Plan: [place] In-house Referral: Clinical Social Work Discharge Planning Services: CM Consult Post Acute Care Choice: [place] Living arrangements for the past 2 months: Apartment                                      Prior Living Arrangements/Services Living arrangements for the past 2 months: Apartment Lives with:: Adult Children Patient language and need for interpreter reviewed:: Yes Do you feel safe going back to the place where you live?: Yes      Need for Family Participation in Patient Care: Yes (Comment) Care giver support system in place?: Yes  (comment) Current home services: DME (rollator, cane, wheelchair, 3N1) Criminal Activity/Legal Involvement Pertinent to Current Situation/Hospitalization: No - Comment as needed  Activities of Daily Living      Permission Sought/Granted                  Emotional Assessment Appearance:: Appears stated age Attitude/Demeanor/Rapport: Engaged Affect (typically observed): Accepting Orientation: : Oriented to Self,Oriented to Place,Oriented to  Time,Oriented to Situation Alcohol / Substance Use: Not Applicable Psych Involvement: No (comment)  Admission diagnosis:  Hip Pain Patient Active Problem List   Diagnosis Date Noted  . Palliative care by specialist   . Generalized weakness 11/14/2019  . HCAP (healthcare-associated pneumonia) 11/14/2019  . Hyperkalemia 11/13/2019  . Hyponatremia 09/27/2019  . Closed fracture of right proximal humerus 04/20/2019  . Surgery, elective   . Seizure ([place]) 11/12/2018  . History of hydronephrosis --stents in place 12/08/2017  . Stroke ([place]) 11/29/2017  . Seizures ([place])   . Hydronephrosis   . Recurrent UTI   . Diabetes mellitus type 2 in nonobese (HCC)   . ESRD (end stage renal disease) ([place])   . Anemia of chronic disease   . Chronic diastolic congestive heart failure ([place])   . Essential hypertension   . PICC (peripherally inserted central catheter) in place   . Acute pyelonephritis 11/12/2017  . Uncontrolled  type 2 diabetes mellitus with hyperglycemia, with long-term current use of insulin ([place]) 11/10/2017  . Renal failure 11/08/2017  . Seizure disorder ([place]) 11/08/2017  . Orthostatic hypotension 07/25/2014  . Moderate protein malnutrition ([place]) 09/15/2013  . Aspiration pneumonia ([place]) 09/15/2013  . Acute respiratory failure requiring reintubation ([place]) 09/11/2013  . probable Seizures due to metabolic disorder 93/26/7124  . Lactic acidosis 03/19/2013  . Abdominal pain 03/19/2013  . Rotavirus infection 10/29/2012  . Type II or  unspecified type diabetes mellitus without mention of complication, uncontrolled 10/29/2012  . Protein-calorie malnutrition, severe ([place]) 10/27/2012  . NSTEMI (non-ST elevated myocardial infarction) ([place]) 10/26/2012  . Fever, unspecified 10/26/2012  . Hypotension 10/25/2012  . Metabolic acidosis 58/10/9831  . Chronic diarrhea 10/25/2012  . Tobacco abuse 10/25/2012  . DKA (diabetic ketoacidoses) 09/09/2012  . Dehydration 09/09/2012  . DKA, type 2 ([place]) 05/20/2012  . Abnormal LFTs 05/20/2012  . Heart murmur, systolic 82/50/5397  . Hypoglycemia 07/08/2011  . Metabolic encephalopathy 67/34/1937  . Alcohol abuse 07/08/2011  . Hypokalemia 07/08/2011  . Nausea & vomiting 07/08/2011  . H/O chronic pancreatitis 07/08/2011   PCP:  [name], NP Pharmacy:   [place], [place] - [address] [place] [address] Phone: [phone] Fax: [place], [place] [place] [address] Phone: [phone] Fax: [phone]  [id number] [address]Phone: [phone]: [phone]    Social Determinants of Health (SDOH) Interventions    Readmission Risk Interventions Readmission Risk Prevention Plan 11/19/2019 11/15/2019 11/14/2019  Transportation Screening Complete - Complete  Medication Review (RN Care Manager) Complete - Complete  PCP or Specialist appointment within 3-5 days of discharge Complete Complete -  [facility] Consult Complete - Complete  SW Recovery Care/Counseling Consult Complete - Complete  Palliative Care Screening Complete - Not Applicable  Skilled Nursing Facility Complete - Not Applicable  Some recent data might be hidden

## 2020-06-07 NOTE — ED Notes (Signed)
Pt put on monitor

## 2020-06-07 NOTE — ED Notes (Signed)
Attempted to ambulate 2 person assist - patient was not able to stand without being unsteady, unable to walk without extensive assistance. Patient also had incontinent bowel episode. Reports she lives at home and her son helps her sometimes and hopes he would be able to help her if she went home. MD notified

## 2020-06-07 NOTE — ED Triage Notes (Addendum)
Pt brought in by [facility] with c/o fall today at home when "my walker got out from under me" because she was dizzy. Pt c/o left knee pain and continued dizziness. Pt is on dialysis but reports she missed her dialysis session yesterday. Last dialysis was Tuesday.

## 2020-06-07 NOTE — ED Notes (Signed)
Unable to obtain IV access MD notified

## 2020-06-08 ENCOUNTER — Emergency Department (HOSPITAL_COMMUNITY): Payer: Medicare Other

## 2020-06-08 DIAGNOSIS — I10 Essential (primary) hypertension: Secondary | ICD-10-CM | POA: Diagnosis not present

## 2020-06-08 DIAGNOSIS — Z8673 Personal history of transient ischemic attack (TIA), and cerebral infarction without residual deficits: Secondary | ICD-10-CM | POA: Diagnosis not present

## 2020-06-08 DIAGNOSIS — I160 Hypertensive urgency: Secondary | ICD-10-CM | POA: Diagnosis present

## 2020-06-08 DIAGNOSIS — Z79899 Other long term (current) drug therapy: Secondary | ICD-10-CM | POA: Diagnosis not present

## 2020-06-08 DIAGNOSIS — Z794 Long term (current) use of insulin: Secondary | ICD-10-CM | POA: Diagnosis not present

## 2020-06-08 DIAGNOSIS — W010XXA Fall on same level from slipping, tripping and stumbling without subsequent striking against object, initial encounter: Secondary | ICD-10-CM | POA: Diagnosis present

## 2020-06-08 DIAGNOSIS — W19XXXA Unspecified fall, initial encounter: Secondary | ICD-10-CM | POA: Diagnosis not present

## 2020-06-08 DIAGNOSIS — S3210XA Unspecified fracture of sacrum, initial encounter for closed fracture: Secondary | ICD-10-CM | POA: Diagnosis present

## 2020-06-08 DIAGNOSIS — Z886 Allergy status to analgesic agent status: Secondary | ICD-10-CM | POA: Diagnosis not present

## 2020-06-08 DIAGNOSIS — J440 Chronic obstructive pulmonary disease with acute lower respiratory infection: Secondary | ICD-10-CM | POA: Diagnosis present

## 2020-06-08 DIAGNOSIS — Z20822 Contact with and (suspected) exposure to covid-19: Secondary | ICD-10-CM | POA: Diagnosis present

## 2020-06-08 DIAGNOSIS — E1165 Type 2 diabetes mellitus with hyperglycemia: Secondary | ICD-10-CM | POA: Diagnosis present

## 2020-06-08 DIAGNOSIS — E1122 Type 2 diabetes mellitus with diabetic chronic kidney disease: Secondary | ICD-10-CM | POA: Diagnosis present

## 2020-06-08 DIAGNOSIS — D696 Thrombocytopenia, unspecified: Secondary | ICD-10-CM

## 2020-06-08 DIAGNOSIS — N2581 Secondary hyperparathyroidism of renal origin: Secondary | ICD-10-CM | POA: Diagnosis present

## 2020-06-08 DIAGNOSIS — I5030 Unspecified diastolic (congestive) heart failure: Secondary | ICD-10-CM

## 2020-06-08 DIAGNOSIS — E8770 Fluid overload, unspecified: Secondary | ICD-10-CM | POA: Diagnosis present

## 2020-06-08 DIAGNOSIS — G40909 Epilepsy, unspecified, not intractable, without status epilepticus: Secondary | ICD-10-CM | POA: Diagnosis present

## 2020-06-08 DIAGNOSIS — Z9115 Patient's noncompliance with renal dialysis: Secondary | ICD-10-CM | POA: Diagnosis not present

## 2020-06-08 DIAGNOSIS — J189 Pneumonia, unspecified organism: Secondary | ICD-10-CM | POA: Diagnosis present

## 2020-06-08 DIAGNOSIS — R718 Other abnormality of red blood cells: Secondary | ICD-10-CM

## 2020-06-08 DIAGNOSIS — I132 Hypertensive heart and chronic kidney disease with heart failure and with stage 5 chronic kidney disease, or end stage renal disease: Secondary | ICD-10-CM | POA: Diagnosis present

## 2020-06-08 DIAGNOSIS — I5032 Chronic diastolic (congestive) heart failure: Secondary | ICD-10-CM | POA: Diagnosis present

## 2020-06-08 DIAGNOSIS — F32A Depression, unspecified: Secondary | ICD-10-CM | POA: Diagnosis present

## 2020-06-08 DIAGNOSIS — N186 End stage renal disease: Secondary | ICD-10-CM

## 2020-06-08 DIAGNOSIS — Y92009 Unspecified place in unspecified non-institutional (private) residence as the place of occurrence of the external cause: Secondary | ICD-10-CM | POA: Diagnosis not present

## 2020-06-08 DIAGNOSIS — R188 Other ascites: Secondary | ICD-10-CM | POA: Diagnosis present

## 2020-06-08 DIAGNOSIS — K219 Gastro-esophageal reflux disease without esophagitis: Secondary | ICD-10-CM | POA: Diagnosis present

## 2020-06-08 DIAGNOSIS — E1142 Type 2 diabetes mellitus with diabetic polyneuropathy: Secondary | ICD-10-CM | POA: Diagnosis present

## 2020-06-08 DIAGNOSIS — Z992 Dependence on renal dialysis: Secondary | ICD-10-CM | POA: Diagnosis not present

## 2020-06-08 LAB — CBC
HCT: 38.5 % (ref 36.0–46.0)
Hemoglobin: 12.2 g/dL (ref 12.0–15.0)
MCH: 33.9 pg (ref 26.0–34.0)
MCHC: 31.7 g/dL (ref 30.0–36.0)
MCV: 106.9 fL — ABNORMAL HIGH (ref 80.0–100.0)
Platelets: 112 10*3/uL — ABNORMAL LOW (ref 150–400)
RBC: 3.6 MIL/uL — ABNORMAL LOW (ref 3.87–5.11)
RDW: 12.7 % (ref 11.5–15.5)
WBC: 7.1 10*3/uL (ref 4.0–10.5)
nRBC: 0 % (ref 0.0–0.2)

## 2020-06-08 LAB — PHOSPHORUS: Phosphorus: 5.7 mg/dL — ABNORMAL HIGH (ref 2.5–4.6)

## 2020-06-08 LAB — COMPREHENSIVE METABOLIC PANEL
ALT: 229 U/L — ABNORMAL HIGH (ref 0–44)
AST: 299 U/L — ABNORMAL HIGH (ref 15–41)
Albumin: 2.9 g/dL — ABNORMAL LOW (ref 3.5–5.0)
Alkaline Phosphatase: 221 U/L — ABNORMAL HIGH (ref 38–126)
Anion gap: 14 (ref 5–15)
BUN: 76 mg/dL — ABNORMAL HIGH (ref 8–23)
CO2: 22 mmol/L (ref 22–32)
Calcium: 9.1 mg/dL (ref 8.9–10.3)
Chloride: 98 mmol/L (ref 98–111)
Creatinine, Ser: 5.89 mg/dL — ABNORMAL HIGH (ref 0.44–1.00)
GFR, Estimated: 7 mL/min — ABNORMAL LOW (ref >60)
Glucose, Bld: 85 mg/dL (ref 70–99)
Potassium: 4.5 mmol/L (ref 3.5–5.1)
Sodium: 134 mmol/L — ABNORMAL LOW (ref 135–145)
Total Bilirubin: 0.7 mg/dL (ref 0.3–1.2)
Total Protein: 6.7 g/dL (ref 6.5–8.1)

## 2020-06-08 LAB — HEPATITIS B SURFACE ANTIGEN: Hepatitis B Surface Ag: NONREACTIVE

## 2020-06-08 LAB — APTT: aPTT: 28 seconds (ref 24–36)

## 2020-06-08 LAB — LACTIC ACID, PLASMA
Lactic Acid, Venous: 1.5 mmol/L (ref 0.5–1.9)
Lactic Acid, Venous: 1 mmol/L (ref 0.5–1.9)

## 2020-06-08 LAB — GLUCOSE, CAPILLARY
Glucose-Capillary: 113 mg/dL — ABNORMAL HIGH (ref 70–99)
Glucose-Capillary: 126 mg/dL — ABNORMAL HIGH (ref 70–99)
Glucose-Capillary: 61 mg/dL — ABNORMAL LOW (ref 70–99)
Glucose-Capillary: 73 mg/dL (ref 70–99)
Glucose-Capillary: 89 mg/dL (ref 70–99)

## 2020-06-08 LAB — RESP PANEL BY RT-PCR (FLU A&B, COVID) ARPGX2
Influenza A by PCR: NEGATIVE
Influenza B by PCR: NEGATIVE
SARS Coronavirus 2 by RT PCR: NEGATIVE

## 2020-06-08 LAB — PROTIME-INR
INR: 1.2 (ref 0.8–1.2)
Prothrombin Time: 15.2 seconds (ref 11.4–15.2)

## 2020-06-08 LAB — FOLATE: Folate: 60.5 ng/mL (ref >5.9)

## 2020-06-08 LAB — VITAMIN B12: Vitamin B-12: 1134 pg/mL — ABNORMAL HIGH (ref 180–914)

## 2020-06-08 LAB — MAGNESIUM: Magnesium: 2.5 mg/dL — ABNORMAL HIGH (ref 1.7–2.4)

## 2020-06-08 LAB — CBG MONITORING, ED: Glucose-Capillary: 81 mg/dL (ref 70–99)

## 2020-06-08 LAB — MRSA PCR SCREENING: MRSA by PCR: NEGATIVE

## 2020-06-08 LAB — PROCALCITONIN: Procalcitonin: 2.18 ng/mL

## 2020-06-08 MED ORDER — LIDOCAINE-PRILOCAINE 2.5-2.5 % EX CREA: 1.0000 "application " | TOPICAL_CREAM | CUTANEOUS | Status: DC | PRN

## 2020-06-08 MED ORDER — CHLORHEXIDINE GLUCONATE CLOTH 2 % EX PADS
6.0000 | MEDICATED_PAD | Freq: Every day | CUTANEOUS | Status: DC
  Administered 2020-06-08 – 2020-06-11 (×4): 6 via TOPICAL

## 2020-06-08 MED ORDER — HEPARIN SODIUM (PORCINE) 5000 UNIT/ML IJ SOLN
5000.0000 [IU] | Freq: Three times a day (TID) | INTRAMUSCULAR | Status: DC
  Administered 2020-06-09 – 2020-06-12 (×9): 5000 [IU] via SUBCUTANEOUS
  Filled 2020-06-08 (×11): qty 1

## 2020-06-08 MED ORDER — HYDRALAZINE HCL 20 MG/ML IJ SOLN
10.0000 mg | Freq: Once | INTRAMUSCULAR | Status: AC
  Administered 2020-06-08: 10 mg via INTRAVENOUS
  Filled 2020-06-08: qty 1

## 2020-06-08 MED ORDER — HYDROCODONE-ACETAMINOPHEN 5-325 MG PO TABS
1.0000 | ORAL_TABLET | Freq: Four times a day (QID) | ORAL | Status: DC | PRN
  Administered 2020-06-08 – 2020-06-10 (×4): 1 via ORAL
  Filled 2020-06-08 (×4): qty 1

## 2020-06-08 MED ORDER — SODIUM CHLORIDE 0.9 % IV SOLN: 100.0000 mL | INTRAVENOUS | Status: DC | PRN

## 2020-06-08 MED ORDER — AZITHROMYCIN 250 MG PO TABS
500.0000 mg | ORAL_TABLET | Freq: Every day | ORAL | Status: AC
  Administered 2020-06-08: 500 mg via ORAL
  Filled 2020-06-08: qty 2

## 2020-06-08 MED ORDER — CHLORHEXIDINE GLUCONATE CLOTH 2 % EX PADS
6.0000 | MEDICATED_PAD | Freq: Every day | CUTANEOUS | Status: DC
  Administered 2020-06-08 – 2020-06-12 (×5): 6 via TOPICAL

## 2020-06-08 MED ORDER — LIDOCAINE HCL (PF) 1 % IJ SOLN: 5.0000 mL | INTRAMUSCULAR | Status: DC | PRN

## 2020-06-08 MED ORDER — AZITHROMYCIN 250 MG PO TABS
250.0000 mg | ORAL_TABLET | Freq: Every day | ORAL | Status: DC
  Administered 2020-06-09 – 2020-06-11 (×3): 250 mg via ORAL
  Filled 2020-06-08 (×3): qty 1

## 2020-06-08 MED ORDER — SODIUM CHLORIDE 0.9 % IV SOLN
1.0000 g | INTRAVENOUS | Status: DC
  Administered 2020-06-08 – 2020-06-10 (×3): 1 g via INTRAVENOUS
  Filled 2020-06-08 (×3): qty 10

## 2020-06-08 MED ORDER — PENTAFLUOROPROP-TETRAFLUOROETH EX AERO: 1.0000 "application " | INHALATION_SPRAY | CUTANEOUS | Status: DC | PRN

## 2020-06-08 NOTE — ED Provider Notes (Signed)
Dialysis patient holding in the ED for placement after having a fall and suffering a subacute sacral fracture.  Found to have hyperglycemia without DKA.  Missed dialysis 2 days ago and is due tomorrow.  Temperature of 99 noted. Rectal temp 99.1.  Lactate is normal.  Blood culture sent.  Chest x-ray concerning for multifocal infiltrates versus edema.  Patient unable to give a urine sample.  States she urinates less than once a day.  Remains generally weak with pain unable to walk or ambulate.  Patient with new sacral fracture as well as hyperglycemia and need for dialysis. Will request admission to the hospital. D/w Dr. Josephine Cables.    Ezequiel Essex, MD 06/08/20 [phone]

## 2020-06-08 NOTE — ED Notes (Signed)
Pt PIV initiated, blood work and covid swab obtained and sent to lab. VS updated, pt reports she is too weak to ambulate, pt reports she does not make urine often, approx once per week and that she went this morning, brief dry no signs of urine or stool. Pt repositioned in bed, warm blankets applied, on cardiac monitoring, call bell and personal belongings in reach. NAD noted. Will continue to monitor. Dr. Chauncey Cruel. [place] of inability to collect urine or ambulate pt at this time.

## 2020-06-08 NOTE — Progress Notes (Signed)
Patient transferred to med surg. Patient was found to have Snuff in her purse, and a spit cup at bedside. Education on use of tobacco products and policy during hospitalization provided. Patient verbalized understanding.   All belongings sent with patient up to unit

## 2020-06-08 NOTE — Procedures (Signed)
   HEMODIALYSIS TREATMENT NOTE:  Uneventful 3 hour heparin-free HD completed via left upper arm loop AVG. Goal met: 2.5 liters removed without interruption in ultrafiltration.  All blood was returned and hemostasis was achieved in 15 minutes.  Rockwell Alexandria, RN

## 2020-06-08 NOTE — Consult Note (Signed)
Referring Provider: No ref. provider found Primary Care Physician:  [name], NP Primary Nephrologist:     Reason for Consultation: Medical management end-stage renal disease, management of anemia maintenance euvolemia management secondary hyperparathyroidism.  HPI: Interval History: This is a 65 year old end-stage renal disease a dialysis patient Tuesday Thursday Saturday dialysis.  She dialyzes at [facility] she has a history of diastolic dysfunction history of stroke and hypertension she also has diabetes mellitus type 2 and COPD.  She was admitted 06/08/2020 with a transversely oriented nondisplaced fracture through the sacrum.  [facility] on TTS- last 11/11/2019. EDW 43.5kg, Time: 3.75 hours, 2K/ 2.5 Ca, BFR 400, DFR 600, Dialyzer: [place] Revacir 200, LUE AVF Heparin 1000 unit load, 500 units 1 mg 3 times daily hour (stop 60 min before end of HD  Blood pressure 195/70 pulse 80 temperature 97.8 O2 sats 98% room air  Sodium 134 potassium 4.5 chloride 98 CO2 22 BUN 76 creatinine 5.89 glucose 85 calcium 9.1 phosphorus 5.7 magnesium 2.5 albumin 2.9  Medications: Calcitrol 0.25 mcg with dialysis.  Carvedilol 6.25 mg twice daily Auryxia 420 mg 3 times daily with meals, folic acid 1 daily, hydralazine 25 mg every 8 hours insulin sliding scale, Keppra 1 g daily, losartan 25 mg daily nifedipine 60 mg daily, Requip 1 mg 3 times a daily  Past Medical History:  Diagnosis Date  . Alcohol-induced pancreatitis   . Chronic diarrhea   . Depression   . Diabetes mellitus    fasting blood sugar 110-120s  . Dialysis patient ([place])   . Diastolic CHF ([place])   . DKA (diabetic ketoacidoses)   . Gastroparesis   . GERD (gastroesophageal reflux disease)   . Heart murmur   . History of kidney stones   . Hyperlipidemia   . Hypertension   . Hypokalemia   . Muscle spasm   . Neuropathic pain   . Neuropathy    Hx: of  . Pyelonephritis   . Seizures ([place])   . Vitamin B12 deficiency   . Vitamin D  deficiency     Past Surgical History:  Procedure Laterality Date  . AV FISTULA PLACEMENT Left 12/15/2017   Procedure: INSERTION OF ARTERIOVENOUS (AV) GORE-TEX GRAFT ARM;  Surgeon: Rosetta Posner, MD;  Location: [place];  Service: Vascular;  Laterality: Left;  . CATARACT EXTRACTION W/PHACO Right 03/14/2015   Procedure: CATARACT EXTRACTION PHACO AND INTRAOCULAR LENS PLACEMENT (IOC);  Surgeon: Baruch Goldmann, MD;  Location: [facility];  Service: Ophthalmology;  Laterality: Right;  CDE:11.13  . CATARACT EXTRACTION W/PHACO Left 04/11/2015   Procedure: CATARACT EXTRACTION PHACO AND INTRAOCULAR LENS PLACEMENT LEFT EYE CDE=9.68;  Surgeon: Baruch Goldmann, MD;  Location: [facility];  Service: Ophthalmology;  Laterality: Left;  . COLONOSCOPY  02/24/2010  . cystoscopy with ureteral stent  Bilateral 10/07/2017   At [place] CV LINE RIGHT  11/17/2017  . IR FLUORO GUIDE CV LINE RIGHT  11/22/2017  . IR REMOVAL TUN CV CATH W/O FL  01/26/2018  . IR US GUIDE VASC ACCESS RIGHT  11/17/2017  . MULTIPLE EXTRACTIONS WITH ALVEOLOPLASTY N/A 06/13/2012   Procedure: MULTIPLE EXTRACION WITH ALVEOLOPLASTY EXTRACT: 18, 19, 20, 21, 22, 24, 25, 27, 28, 29, 30, 31;  Surgeon: Gae Bon, DDS;  Location: [place];  Service: Oral Surgery;  Laterality: N/A;  . ORIF HUMERUS FRACTURE Right 04/20/2019   Procedure: OPEN REDUCTION INTERNAL FIXATION (ORIF) PROXIMAL HUMERUS FRACTURE;  Surgeon: Marchia Bond, MD;  Location: [place];  Service: Orthopedics;  Laterality: Right;  . TUBAL LIGATION    . URETERAL STENT PLACEMENT  09/2017    Prior to Admission medications   Medication Sig Start Date End Date Taking? Authorizing Provider  acetaminophen (TYLENOL) 325 MG tablet Take 650 mg by mouth every 4 (four) hours as needed for moderate pain.    [provider]  albuterol (PROAIR HFA) 108 (90 Base) MCG/ACT inhaler Inhale 2 puffs into the lungs every 4 (four) hours as needed for wheezing or shortness of breath.      [provider]  calcitRIOL (ROCALTROL) 0.25 MCG capsule Take 1 capsule (0.25 mcg total) by mouth Every Tuesday,Thursday,and Saturday with dialysis. 12/18/17   Love, Ivan Anchors, PA-C  carboxymethylcellulose (ARTIFICIAL TEARS) 1 % ophthalmic solution Place 1 drop into both eyes daily as needed (dry eyes).    [provider]  carvedilol (COREG) 6.25 MG tablet Take 1 tablet (6.25 mg total) by mouth 2 (two) times daily. 11/15/18   Kinnie Feil, MD  cinacalcet (SENSIPAR) 30 MG tablet Take 30 mg by mouth every evening.    [provider]  dextrose (GLUTOSE) 40 % GEL Take 1 Tube by mouth every 10 (ten) minutes as needed for low blood sugar (unitl blood sugar is above 70).    [provider]  Emollient (CETAPHIL) cream Apply 1 application topically every 12 (twelve) hours as needed (dry skin).    [provider]  ferric citrate (AURYXIA) 1 GM 210 MG(Fe) tablet Take 2 tablets (420 mg total) by mouth 3 (three) times daily with meals. 11/15/18   Kinnie Feil, MD  folic acid (FOLVITE) 1 MG tablet Take 1 mg by mouth every morning.    [provider]  hydrALAZINE (APRESOLINE) 25 MG tablet Take 1 tablet (25 mg total) by mouth every 8 (eight) hours. 11/19/19   Oswald Hillock, MD  insulin lispro (HUMALOG) 100 UNIT/ML injection Inject 2-10 Units into the skin 4 (four) times daily. Per sliding scale: CBG 151-200 2 units, 201-250 4 units, 251-300 6 units, 301-350 8 units, 351-400 10 units,    [provider]  levETIRAcetam (KEPPRA) 1000 MG tablet Take 1,000 mg by mouth every morning.    [provider]  loperamide (IMODIUM) 2 MG capsule Take 4 mg by mouth daily as needed for diarrhea or loose stools.    [provider]  losartan (COZAAR) 25 MG tablet Take 1 tablet (25 mg total) by mouth daily. 11/20/19   Oswald Hillock, MD  metoCLOPramide (REGLAN) 5 MG tablet Take 1 tablet (5 mg total) by mouth every 6 (six) hours as needed for nausea,  vomiting or refractory nausea / vomiting. 11/19/19   Oswald Hillock, MD  NIFEdipine (ADALAT CC) 60 MG 24 hr tablet Take 1 tablet (60 mg total) by mouth every morning. 11/20/19   Oswald Hillock, MD  ondansetron (ZOFRAN ODT) 8 MG disintegrating tablet '8mg'$  ODT q4 hours prn nausea 03/21/20   Veryl Speak, MD  pantoprazole (PROTONIX) 40 MG tablet Take 1 tablet (40 mg total) by mouth daily. 11/19/19 01/18/20  Oswald Hillock, MD  phenytoin (DILANTIN INFATABS) 50 MG tablet Chew 6 tablets (300 mg total) by mouth daily. 09/29/19 10/29/19  Manuella Ghazi, Pratik D, DO  polyethylene glycol (MIRALAX) 17 g packet Take 17 g by mouth daily. 08/10/19   Tacy Learn, PA-C  rOPINIRole (REQUIP) 1 MG tablet Take 1 mg by mouth 3 (three) times daily.    [provider]  senna (  SENOKOT) 8.6 MG TABS tablet Take 8.6 mg by mouth in the morning and at bedtime.    [provider]  topiramate (TOPAMAX) 25 MG tablet Take 1 tablet (25 mg total) by mouth 2 (two) times daily. 11/29/17 11/12/19  Arrien, Jimmy Picket, MD  Vitamin D, Ergocalciferol, (DRISDOL) 1.25 MG (50000 UT) CAPS capsule Take 50,000 Units by mouth every Friday.    [provider]    Current Facility-Administered Medications  Medication Dose Route Frequency Provider Last Rate Last Admin  . acetaminophen (TYLENOL) tablet 650 mg  650 mg Oral Q4H PRN Adefeso, Oladapo, DO      . albuterol (VENTOLIN HFA) 108 (90 Base) MCG/ACT inhaler 2 puff  2 puff Inhalation Q4H PRN Adefeso, Oladapo, DO      . calcitRIOL (ROCALTROL) capsule 0.25 mcg  0.25 mcg Oral Q T,Th,Sa-HD Adefeso, Oladapo, DO      . carvedilol (COREG) tablet 6.25 mg  6.25 mg Oral BID Adefeso, Oladapo, DO   6.25 mg at 06/08/20 0047  . Chlorhexidine Gluconate Cloth 2 % PADS 6 each  6 each Topical Q0600 Adefeso, Oladapo, DO   6 each at 06/08/20 [phone]  . ferric citrate (AURYXIA) tablet 420 mg  420 mg Oral TID WC Adefeso, Oladapo, DO      . folic acid (FOLVITE) tablet 1 mg  1 mg Oral q morning Adefeso,  Oladapo, DO      . heparin injection 5,000 Units  5,000 Units Subcutaneous Q8H Adefeso, Oladapo, DO      . hydrALAZINE (APRESOLINE) tablet 25 mg  25 mg Oral Q8H Adefeso, Oladapo, DO   25 mg at 06/08/20 0617  . HYDROcodone-acetaminophen (NORCO/VICODIN) 5-325 MG per tablet 1 tablet  1 tablet Oral Q6H PRN Adefeso, Oladapo, DO      . insulin aspart (novoLOG) injection 0-6 Units  0-6 Units Subcutaneous TID WC Adefeso, Oladapo, DO      . insulin aspart (novoLOG) injection 2 Units  2 Units Subcutaneous TID WC Adefeso, Oladapo, DO      . levETIRAcetam (KEPPRA) tablet 1,000 mg  1,000 mg Oral q morning Adefeso, Oladapo, DO      . losartan (COZAAR) tablet 25 mg  25 mg Oral Daily Adefeso, Oladapo, DO      . metoCLOPramide (REGLAN) tablet 5 mg  5 mg Oral Q6H PRN Adefeso, Oladapo, DO      . NIFEdipine (PROCARDIA-XL/NIFEDICAL-XL) 24 hr tablet 60 mg  60 mg Oral q morning Adefeso, Oladapo, DO      . ondansetron (ZOFRAN-ODT) disintegrating tablet 4 mg  4 mg Oral Q8H PRN Adefeso, Oladapo, DO      . polyethylene glycol (MIRALAX / GLYCOLAX) packet 17 g  17 g Oral Daily Adefeso, Oladapo, DO      . polyvinyl alcohol (LIQUIFILM TEARS) 1.4 % ophthalmic solution 1 drop  1 drop Both Eyes BID Adefeso, Oladapo, DO   1 drop at 06/08/20 0047  . rOPINIRole (REQUIP) tablet 1 mg  1 mg Oral TID Adefeso, Oladapo, DO   1 mg at 06/08/20 0047    Allergies as of 06/07/2020 - Review Complete 06/07/2020  Allergen Reaction Noted  . Aspirin Palpitations 10/29/2010    Family History  Problem Relation Age of Onset  . Diabetes Sister   . Chronic Renal Failure Neg Hx     Social History   Socioeconomic History  . Marital status: Legally Separated    Spouse name: Not on file  . Number of children: Not on file  . Years of  education: Not on file  . Highest education level: Not on file  Occupational History  . Not on file  Tobacco Use  . Smoking status: Never Smoker  . Smokeless tobacco: Current User    Types: Snuff  Vaping Use   . Vaping Use: Never used  Substance and Sexual Activity  . Alcohol use: Not Currently    Alcohol/week: 1.0 standard drink    Types: 1 Cans of beer per week  . Drug use: No  . Sexual activity: Not Currently  Other Topics Concern  . Not on file  Social History Narrative  . Not on file   Social Determinants of Health   Financial Resource Strain: Not on file  Food Insecurity: Not on file  Transportation Needs: Not on file  Physical Activity: Not on file  Stress: Not on file  Social Connections: Not on file  Intimate Partner Violence: Not on file    Review of Systems: Gen: Denies any fever, chills, sweats, anorexia, fatigue, weakness, malaise, weight loss, and sleep disorder HEENT: No visual complaints, No history of Retinopathy. Normal external appearance No Epistaxis or Sore throat. No sinusitis.   CV: Denies chest pain, angina, palpitations, syncope, orthopnea, PND, peripheral edema, and claudication. Resp: Denies dyspnea at rest, dyspnea with exercise, cough, sputum, wheezing, coughing up blood, and pleurisy. GI: Denies vomiting blood, jaundice, and fecal incontinence.   Denies dysphagia or odynophagia. GU : Denies urinary burning, blood in urine, urinary frequency, urinary hesitancy, nocturnal urination, and urinary incontinence.  No renal calculi. MS: Denies joint pain, limitation of movement, and swelling, stiffness, low back pain, extremity pain. Denies muscle weakness, cramps, atrophy.  No use of non steroidal antiinflammatory drugs. Derm: Denies rash, itching, dry skin, hives, moles, warts, or unhealing ulcers.  Psych: Denies depression, anxiety, memory loss, suicidal ideation, hallucinations, paranoia, and confusion. Heme: Denies bruising, bleeding, and enlarged lymph nodes. Neuro: No headache.  No diplopia. No dysarthria.  No dysphasia.  No history of CVA.  No Seizures. No paresthesias.  No weakness. Endocrine  Diabetic.  No Thyroid disease.  No Adrenal  disease.  Physical Exam: Vital signs in last 24 hours: Temp:  [97.8 F (36.6 C)-99.3 F (37.4 C)] 99.2 F (37.3 C) (04/23 0721) Pulse Rate:  [76-85] 79 (04/23 0721) Resp:  [11-21] 14 (04/23 0721) BP: (146-208)/(56-151) 195/70 (04/23 0700) SpO2:  [94 %-99 %] 98 % (04/23 0721) Weight:  [47.8 kg-49 kg] 47.8 kg (04/23 0616) Last BM Date: 06/07/20 General:   Chronically ill-appearing   Head:  Normocephalic and atraumatic. Eyes:  Sclera clear, no icterus.   Conjunctiva pink. Ears:  Normal auditory acuity. Nose:  No deformity, discharge,  or lesions. Mouth:  No deformity or lesions, dentition normal. Neck:  Supple; no masses or thyromegaly. JVP not elevated Lungs:  Clear throughout to auscultation.   No wheezes, crackles, or rhonchi. No acute distress. Heart:  Regular rate and rhythm; no murmurs, clicks, rubs,  or gallops. Abdomen:  Soft, nontender and nondistended. No masses, hepatosplenomegaly or hernias noted. Normal bowel sounds, without guarding, and without rebound.   Msk:  Symmetrical without gross deformities. Normal posture.  Left AV fistula Pulses:  No carotid, renal, femoral bruits. DP and PT symmetrical and equal Extremities:  Without clubbing or edema. Neurologic:  Alert and  oriented x4;  grossly normal neurologically. Skin:  Intact without significant lesions or rashes.    Intake/Output from previous day: No intake/output data recorded. Intake/Output this shift: No intake/output data recorded.  Lab Results: Recent Labs  06/07/20 1744 06/08/20 0630  WBC 5.4 7.1  HGB 12.1 12.2  HCT 38.3 38.5  PLT 115* 112*   BMET Recent Labs    06/07/20 1744 06/08/20 0630  NA 130* 134*  K 4.9 4.5  CL 95* 98  CO2 22 22  GLUCOSE 554* 85  BUN 68* 76*  CREATININE 5.63* 5.89*  CALCIUM 8.9 9.1  PHOS  --  5.7*   LFT Recent Labs    06/08/20 0630  PROT 6.7  ALBUMIN 2.9*  AST 299*  ALT 229*  ALKPHOS 221*  BILITOT 0.7   PT/INR Recent Labs    06/08/20 0630   LABPROT 15.2  INR 1.2   Hepatitis Panel No results for input(s): HEPBSAG, HCVAB, HEPAIGM, HEPBIGM in the last 72 hours.  Studies/Results: CT Head Wo Contrast  Result Date: 06/07/2020 CLINICAL DATA:  Fall with head trauma EXAM: CT HEAD WITHOUT CONTRAST TECHNIQUE: Contiguous axial images were obtained from the base of the skull through the vertex without intravenous contrast. COMPARISON:  Head CT December 12, 2018 FINDINGS: Brain: No evidence of acute large vascular territory infarction, hemorrhage, hydrocephalus, extra-axial collection or mass lesion/mass effect. Moderate age related cortical atrophy. Similar mild burden of chronic ischemic white matter disease. Vascular: No hyperdense vessel. Atherosclerotic calcifications of the internal carotid arteries at the skull base. Skull: Hyperostosis frontalis. Negative for fracture or focal lesion. Sinuses/Orbits: The paranasal sinuses and mastoid air cells are predominantly clear. Other: None IMPRESSION: 1. No acute intracranial findings. 2. Similar moderate age related cortical atrophy and mild chronic ischemic white matter disease. Electronically Signed   By: Dahlia Bailiff MD   On: 06/07/2020 17:55   CT PELVIS WO CONTRAST  Result Date: 06/07/2020 CLINICAL DATA:  Pelvic pain after fall EXAM: CT PELVIS WITHOUT CONTRAST TECHNIQUE: Multidetector CT imaging of the pelvis was performed following the standard protocol without intravenous contrast. COMPARISON:  05/03/2020, 03/21/2020 FINDINGS: Urinary Tract: Urinary bladder obscured by metallic streak artifact from patient's hip prosthesis. Bowel: Large rectal stool ball. No bowel obstruction or evidence of acute bowel inflammation. Vascular/Lymphatic: Extensive vascular calcifications. No pelvic or inguinal lymphadenopathy. Reproductive:  No acute abnormality. Other: Moderate volume ascites seen within the pelvis. Diffuse anasarca. Musculoskeletal: Subacute transversely oriented nondisplaced fracture through  the sacrum at the S4 level (series 6, images 10 9-112), new from prior CT. SI joints and pubic symphysis intact without diastasis. Left hip is intact without fracture or dislocation. Partially visualized right hip arthroplasty hardware without apparent complication. IMPRESSION: 1. Subacute appearing transversely oriented nondisplaced fracture through the sacrum at the S4 level, new from prior CT. 2. Moderate volume ascites within the pelvis. Diffuse anasarca. 3. Large rectal stool ball. Electronically Signed   By: Davina Poke D.O.   On: 06/07/2020 18:13   DG Chest Portable 1 View  Result Date: 06/08/2020 CLINICAL DATA:  Fever, wheezing, shortness of breath EXAM: PORTABLE CHEST 1 VIEW COMPARISON:  11/15/2019 FINDINGS: Mild cardiac enlargement. Shallow inspiration. Patchy perihilar infiltrates with peribronchial thickening. Possibly due to airways disease or multifocal pneumonia. No pleural effusions. No pneumothorax. Mediastinal contours appear intact. Postoperative changes in the right shoulder. IMPRESSION: Patchy perihilar infiltrates with peribronchial thickening suggesting airways disease or multifocal pneumonia. Electronically Signed   By: Lucienne Capers M.D.   On: 06/08/2020 01:38    Assessment/Plan:  ESRD-Tuesday Thursday Saturday dialysis  [facility] clinic.    we will plan dialysis 06/08/2020  ANEMIA-does not appear to be an issue at this time  MBD-continue binders for phosphorus control of Calcitrol  for PTH control  HTN/VOL-continue antihypertensive medications we will dialyze to dry weight  ACCESS-AV fistula  Diabetes: Per primary service  Sacral fracture pain control   LOS: 0 Sherril Croon '@TODAY''@7'$ :45 AM

## 2020-06-08 NOTE — Plan of Care (Signed)
  Problem: Acute Rehab PT Goals(only PT should resolve) Goal: Pt Will Go Supine/Side To Sit Outcome: Progressing Flowsheets (Taken 06/08/2020 1143) Pt will go Supine/Side to Sit:  with min guard assist  with minimal assist Goal: Patient Will Transfer Sit To/From Stand Outcome: Progressing Flowsheets (Taken 06/08/2020 1143) Patient will transfer sit to/from stand: with minimal assist Goal: Pt Will Transfer Bed To Chair/Chair To Bed Outcome: Progressing Flowsheets (Taken 06/08/2020 1143) Pt will Transfer Bed to Chair/Chair to Bed: with min assist Goal: Pt Will Ambulate Outcome: Progressing Flowsheets (Taken 06/08/2020 1143) Pt will Ambulate:  25 feet  with minimal assist  with moderate assist  with rolling walker   11:44 AM, 06/08/20 Lonell Grandchild, MPT Physical Therapist with [facility] [phone] office [phone] mobile phone

## 2020-06-08 NOTE — ED Notes (Signed)
Pt up to [facility], large BM, returned to bed. Unsteady gait with 1 assist noted. Call bell and personal belongings within reach. Cardiac monitoring and pulse ox in place. BGL obtained, wnl.  Side rails up x 2, bed locked and low, warm blankets provided, denies further needs at this time. Will continue to monitor.

## 2020-06-08 NOTE — H&P (Addendum)
History and Physical  ALEASE [name] [id number] DOB: May 05, 1955 DOA: 06/07/2020  Referring physician: Ezequiel Essex, MD PCP: [name], NP  Patient coming from: Home  Chief Complaint: Fall  HPI: Erica [name] is a 65 y.o. female with medical history significant for seizures, ESRD on HD on TTS, type II DM, COPD, diastolic CHF, stroke and hypertension who presents to the emergency department due to fall sustained yesterday.  Patient states that she was walking with a walker on the sidewalk near her home when the walker slipped out from her hand and she lost her balance and fell.  She complained of left hip pain and required assistance to be able to get up.  Patient states that she missed her last 2 HD sessions (Tuesday- 4/19 and positive-4/21).  She denies chest pain, shortness of breath, fever or chills. Patient was seen in the ED on 3/18 due to sacral contusion status post accidental fall at home she was discharged home from the ED with some pain medicine   ED Course:  In the emergency department, she was hemodynamically stable except for elevated BP at 182/76.  Work-up in the ED showed elevated MCV (100.4 and thrombocytopenia, BUN/creatinine was 68/5.63, CBG 554.  Influenza A, B and SARS coronavirus 2 was negative Chest x-ray showed patchy perihilar infiltrates with peribronchial thickening suggesting airway disease or multifocal pneumonia CT pelvis without contrast showed subacute appearing transversely oriented nondisplaced fracture through the sacrum at the S4 level. CT of head without contrast showed no acute intracranial findings. She was treated with Tylenol, insulin was given due to hyperglycemia Since patient has missed 2 sessions of HD, it was decided to admit patient for HD and continue with PT/OT eval and recommendation pending possibility of being able to discharge patient to SNF.   Review of Systems: Constitutional: Negative for chills and fever.  HENT:  Negative for ear pain and sore throat.   Eyes: Negative for pain and visual disturbance.  Respiratory: Negative for cough, chest tightness and shortness of breath.   Cardiovascular: Negative for chest pain and palpitations.  Gastrointestinal: Negative for abdominal pain and vomiting.  Endocrine: Negative for polyphagia and polyuria.  Genitourinary: Negative for decreased urine volume, dysuria, enuresis Musculoskeletal: Positive for hip pain. Skin: Negative for color change and rash.  Allergic/Immunologic: Negative for immunocompromised state.  Neurological: Negative for tremors, syncope, speech difficulty Hematological: Does not bruise/bleed easily.  All other systems reviewed and are negative   Past Medical History:  Diagnosis Date  . Alcohol-induced pancreatitis   . Chronic diarrhea   . Depression   . Diabetes mellitus    fasting blood sugar 110-120s  . Dialysis patient ([place])   . Diastolic CHF ([place])   . DKA (diabetic ketoacidoses)   . Gastroparesis   . GERD (gastroesophageal reflux disease)   . Heart murmur   . History of kidney stones   . Hyperlipidemia   . Hypertension   . Hypokalemia   . Muscle spasm   . Neuropathic pain   . Neuropathy    Hx: of  . Pyelonephritis   . Seizures ([place])   . Vitamin B12 deficiency   . Vitamin D deficiency    Past Surgical History:  Procedure Laterality Date  . AV FISTULA PLACEMENT Left 12/15/2017   Procedure: INSERTION OF ARTERIOVENOUS (AV) GORE-TEX GRAFT ARM;  Surgeon: Rosetta Posner, MD;  Location: [place];  Service: Vascular;  Laterality: Left;  . CATARACT EXTRACTION W/PHACO Right 03/14/2015   Procedure: CATARACT EXTRACTION PHACO  AND INTRAOCULAR LENS PLACEMENT (IOC);  Surgeon: Baruch Goldmann, MD;  Location: [facility];  Service: Ophthalmology;  Laterality: Right;  CDE:11.13  . CATARACT EXTRACTION W/PHACO Left 04/11/2015   Procedure: CATARACT EXTRACTION PHACO AND INTRAOCULAR LENS PLACEMENT LEFT EYE CDE=9.68;  Surgeon: Baruch Goldmann, MD;   Location: [facility];  Service: Ophthalmology;  Laterality: Left;  . COLONOSCOPY  02/24/2010  . cystoscopy with ureteral stent  Bilateral 10/07/2017   At [facility] CV LINE RIGHT  11/17/2017  . IR FLUORO GUIDE CV LINE RIGHT  11/22/2017  . IR REMOVAL TUN CV CATH W/O FL  01/26/2018  . IR US GUIDE VASC ACCESS RIGHT  11/17/2017  . MULTIPLE EXTRACTIONS WITH ALVEOLOPLASTY N/A 06/13/2012   Procedure: MULTIPLE EXTRACION WITH ALVEOLOPLASTY EXTRACT: 18, 19, 20, 21, 22, 24, 25, 27, 28, 29, 30, 31;  Surgeon: Gae Bon, DDS;  Location: [facility];  Service: Oral Surgery;  Laterality: N/A;  . ORIF HUMERUS FRACTURE Right 04/20/2019   Procedure: OPEN REDUCTION INTERNAL FIXATION (ORIF) PROXIMAL HUMERUS FRACTURE;  Surgeon: Marchia Bond, MD;  Location: [facility];  Service: Orthopedics;  Laterality: Right;  . TUBAL LIGATION    . URETERAL STENT PLACEMENT  09/2017    Social History:  reports that she has never smoked. Her smokeless tobacco use includes snuff. She reports previous alcohol use of about 1.0 standard drink of alcohol per week. She reports that she does not use drugs.   Allergies  Allergen Reactions  . Aspirin Palpitations    Listed on [facility] 11/12/18    Family History  Problem Relation Age of Onset  . Diabetes Sister   . Chronic Renal Failure Neg Hx      Prior to Admission medications   Medication Sig Start Date End Date Taking? Authorizing Provider  acetaminophen (TYLENOL) 325 MG tablet Take 650 mg by mouth every 4 (four) hours as needed for moderate pain.    [provider]  albuterol (PROAIR HFA) 108 (90 Base) MCG/ACT inhaler Inhale 2 puffs into the lungs every 4 (four) hours as needed for wheezing or shortness of breath.     [provider]  calcitRIOL (ROCALTROL) 0.25 MCG capsule Take 1 capsule (0.25 mcg total) by mouth Every Tuesday,Thursday,and Saturday with dialysis. 12/18/17   Love, Ivan Anchors, PA-C  carboxymethylcellulose (ARTIFICIAL TEARS) 1 % ophthalmic  solution Place 1 drop into both eyes daily as needed (dry eyes).    [provider]  carvedilol (COREG) 6.25 MG tablet Take 1 tablet (6.25 mg total) by mouth 2 (two) times daily. 11/15/18   Kinnie Feil, MD  cinacalcet (SENSIPAR) 30 MG tablet Take 30 mg by mouth every evening.    [provider]  dextrose (GLUTOSE) 40 % GEL Take 1 Tube by mouth every 10 (ten) minutes as needed for low blood sugar (unitl blood sugar is above 70).    [provider]  Emollient (CETAPHIL) cream Apply 1 application topically every 12 (twelve) hours as needed (dry skin).    [provider]  ferric citrate (AURYXIA) 1 GM 210 MG(Fe) tablet Take 2 tablets (420 mg total) by mouth 3 (three) times daily with meals. 11/15/18   Kinnie Feil, MD  folic acid (FOLVITE) 1 MG tablet Take 1 mg by mouth every morning.    [provider]  hydrALAZINE (APRESOLINE) 25 MG tablet Take 1 tablet (25 mg total) by mouth every 8 (eight) hours. 11/19/19   Oswald Hillock, MD  insulin lispro (HUMALOG)  100 UNIT/ML injection Inject 2-10 Units into the skin 4 (four) times daily. Per sliding scale: CBG 151-200 2 units, 201-250 4 units, 251-300 6 units, 301-350 8 units, 351-400 10 units,    [provider]  levETIRAcetam (KEPPRA) 1000 MG tablet Take 1,000 mg by mouth every morning.    [provider]  loperamide (IMODIUM) 2 MG capsule Take 4 mg by mouth daily as needed for diarrhea or loose stools.    [provider]  losartan (COZAAR) 25 MG tablet Take 1 tablet (25 mg total) by mouth daily. 11/20/19   Oswald Hillock, MD  metoCLOPramide (REGLAN) 5 MG tablet Take 1 tablet (5 mg total) by mouth every 6 (six) hours as needed for nausea, vomiting or refractory nausea / vomiting. 11/19/19   Oswald Hillock, MD  NIFEdipine (ADALAT CC) 60 MG 24 hr tablet Take 1 tablet (60 mg total) by mouth every morning. 11/20/19   Oswald Hillock, MD  ondansetron (ZOFRAN ODT) 8 MG disintegrating tablet '8mg'$   ODT q4 hours prn nausea 03/21/20   Veryl Speak, MD  pantoprazole (PROTONIX) 40 MG tablet Take 1 tablet (40 mg total) by mouth daily. 11/19/19 01/18/20  Oswald Hillock, MD  phenytoin (DILANTIN INFATABS) 50 MG tablet Chew 6 tablets (300 mg total) by mouth daily. 09/29/19 10/29/19  Manuella Ghazi, Pratik D, DO  polyethylene glycol (MIRALAX) 17 g packet Take 17 g by mouth daily. 08/10/19   Tacy Learn, PA-C  rOPINIRole (REQUIP) 1 MG tablet Take 1 mg by mouth 3 (three) times daily.    [provider]  senna (SENOKOT) 8.6 MG TABS tablet Take 8.6 mg by mouth in the morning and at bedtime.    [provider]  topiramate (TOPAMAX) 25 MG tablet Take 1 tablet (25 mg total) by mouth 2 (two) times daily. 11/29/17 11/12/19  Arrien, Jimmy Picket, MD  Vitamin D, Ergocalciferol, (DRISDOL) 1.25 MG (50000 UT) CAPS capsule Take 50,000 Units by mouth every Friday.    [provider]    Physical Exam: BP (!) 208/78   Pulse 78   Temp 97.8 F (36.6 C) (Axillary)   Resp 17   Ht '4\' 11"'$  (1.499 m)   Wt 47.8 kg   SpO2 99%   BMI 21.28 kg/m   . General: 65 y.o. year-old female well developed in no acute distress.  Alert and oriented x3. Marland Kitchen HEENT: NCAT, EOMI . Neck: Supple, trachea medial . Cardiovascular: Regular rate and rhythm with no rubs or gallops.  No thyromegaly or JVD noted.  2/4 pulses in all 4 extremities. Marland Kitchen Respiratory: Clear to auscultation with no wheezes or rales. Good inspiratory effort. . Abdomen: Soft nontender nondistended with normal bowel sounds x4 quadrants. . Muskuloskeletal: No cyanosis, clubbing or edema noted bilaterally . Neuro: CN II-XII intact, sensation, reflexes intact . Skin: No ulcerative lesions noted or rashes . Psychiatry: Mood is appropriate for condition and setting          Labs on Admission:  Basic Metabolic Panel: Recent Labs  Lab 06/07/20 1744  NA 130*  K 4.9  CL 95*  CO2 22  GLUCOSE 554*  BUN 68*  CREATININE 5.63*  CALCIUM 8.9   Liver  Function Tests: No results for input(s): AST, ALT, ALKPHOS, BILITOT, PROT, ALBUMIN in the last 168 hours. No results for input(s): LIPASE, AMYLASE in the last 168 hours. No results for input(s): AMMONIA in the last 168 hours. CBC: Recent Labs  Lab 06/07/20 1744  WBC 5.4  NEUTROABS  3.9  HGB 12.1  HCT 38.3  MCV 109.4*  PLT 115*   Cardiac Enzymes: No results for input(s): CKTOTAL, CKMB, CKMBINDEX, TROPONINI in the last 168 hours.  BNP (last 3 results) No results for input(s): BNP in the last 8760 hours.  ProBNP (last 3 results) No results for input(s): PROBNP in the last 8760 hours.  CBG: Recent Labs  Lab 06/07/20 2023 06/07/20 2339 06/08/20 0454  GLUCAP 536* 251* 81    Radiological Exams on Admission: CT Head Wo Contrast  Result Date: 06/07/2020 CLINICAL DATA:  Fall with head trauma EXAM: CT HEAD WITHOUT CONTRAST TECHNIQUE: Contiguous axial images were obtained from the base of the skull through the vertex without intravenous contrast. COMPARISON:  Head CT December 12, 2018 FINDINGS: Brain: No evidence of acute large vascular territory infarction, hemorrhage, hydrocephalus, extra-axial collection or mass lesion/mass effect. Moderate age related cortical atrophy. Similar mild burden of chronic ischemic white matter disease. Vascular: No hyperdense vessel. Atherosclerotic calcifications of the internal carotid arteries at the skull base. Skull: Hyperostosis frontalis. Negative for fracture or focal lesion. Sinuses/Orbits: The paranasal sinuses and mastoid air cells are predominantly clear. Other: None IMPRESSION: 1. No acute intracranial findings. 2. Similar moderate age related cortical atrophy and mild chronic ischemic white matter disease. Electronically Signed   By: Dahlia Bailiff MD   On: 06/07/2020 17:55   CT PELVIS WO CONTRAST  Result Date: 06/07/2020 CLINICAL DATA:  Pelvic pain after fall EXAM: CT PELVIS WITHOUT CONTRAST TECHNIQUE: Multidetector CT imaging of the pelvis was  performed following the standard protocol without intravenous contrast. COMPARISON:  05/03/2020, 03/21/2020 FINDINGS: Urinary Tract: Urinary bladder obscured by metallic streak artifact from patient's hip prosthesis. Bowel: Large rectal stool ball. No bowel obstruction or evidence of acute bowel inflammation. Vascular/Lymphatic: Extensive vascular calcifications. No pelvic or inguinal lymphadenopathy. Reproductive:  No acute abnormality. Other: Moderate volume ascites seen within the pelvis. Diffuse anasarca. Musculoskeletal: Subacute transversely oriented nondisplaced fracture through the sacrum at the S4 level (series 6, images 10 9-112), new from prior CT. SI joints and pubic symphysis intact without diastasis. Left hip is intact without fracture or dislocation. Partially visualized right hip arthroplasty hardware without apparent complication. IMPRESSION: 1. Subacute appearing transversely oriented nondisplaced fracture through the sacrum at the S4 level, new from prior CT. 2. Moderate volume ascites within the pelvis. Diffuse anasarca. 3. Large rectal stool ball. Electronically Signed   By: Davina Poke D.O.   On: 06/07/2020 18:13   DG Chest Portable 1 View  Result Date: 06/08/2020 CLINICAL DATA:  Fever, wheezing, shortness of breath EXAM: PORTABLE CHEST 1 VIEW COMPARISON:  11/15/2019 FINDINGS: Mild cardiac enlargement. Shallow inspiration. Patchy perihilar infiltrates with peribronchial thickening. Possibly due to airways disease or multifocal pneumonia. No pleural effusions. No pneumothorax. Mediastinal contours appear intact. Postoperative changes in the right shoulder. IMPRESSION: Patchy perihilar infiltrates with peribronchial thickening suggesting airways disease or multifocal pneumonia. Electronically Signed   By: Lucienne Capers M.D.   On: 06/08/2020 01:38    EKG: I independently viewed the EKG done and my findings are as followed: Normal sinus rhythm at a rate of 78  bpm  Assessment/Plan Present on Admission: . Sacral fracture ([place]) . Essential hypertension . ESRD (end stage renal disease) ([place])  Active Problems:   Seizure disorder ([place])   Uncontrolled type 2 diabetes mellitus with hyperglycemia, with long-term current use of insulin ([facility])   Essential hypertension   ESRD (end stage renal disease) ([place])   Sacral fracture ([facility])   Thrombocytopenia ([place])  Hypertensive urgency   (HFpEF) heart failure with preserved ejection fraction ([facility])   Fall   Elevated MCV  Sacral fracture secondary to accidental fall CT pelvis showed subacute nondisplaced sacral fracture Continue fall precaution and neurochecks Continue PT/OT eval and treat Continue Tylenol as needed for mild pain Continue Norco 5-325 mg every 6 hours as needed for moderate/severe pain  ESRD on HD (TTS) CT pelvis showed moderate volume ascites within the pelvis.  Diffuse anasarca This may be due to patient missing 2 sessions of HD Nephrology consult placed for HD maintenance while hospitalized Continue fluid restriction  Hypertensive urgency Essential hypertension (uncontrolled) IV hydralazine 10 Mg x1 was given Continue Coreg, hydralazine, Cozaar, Procardia  History of HFpEF Echocardiogram done in October 2019 showed LVEF of 60 to 65% with grade 1 diastolic dysfunction Continue total input/output, daily weights and fluid restriction Continue Cardiac diet  HD will help with fluid overload  Hyperglycemia secondary to T2DM ISS and hypoglycemic protocol  Elevated MCV Folate level and B12 will be checked  Seizure disorder Continue Keppra  Thrombocytopenia Platelets 115 no sign of bleeding Continue to monitor platelet levels with morning labs  Questionable pneumonia Chest x-ray was suggestive of airways disease or multifocal pneumonia Patient denies cough, fever, chills or any respiratory symptoms.  WBC was 5.4 Procalcitonin will be checked prior to starting  antibiotics   DVT prophylaxis: Heparin subcu  Code Status: Full code  Family Communication: None at bedside  Disposition Plan:  Patient is from:                        home Anticipated DC to:                   SNF or family members home Anticipated DC date:               2-3 days Anticipated DC barriers:          Patient requires inpatient management due to sacral fracture requiring PT/OT eval and treat and to continue with maintenance HD  Consults called: Nephrology  Admission status: Inpatient    Bernadette Hoit MD [facility]  06/08/2020, 7:04 AM

## 2020-06-08 NOTE — Progress Notes (Signed)
Erica [name] is a 65 y.o. female with medical history significant for seizures, ESRD on HD on TTS, type IIDM,COPD, diastolic CHF, stroke and hypertension who presents to the emergency department due to fall sustained yesterday.  Patient was admitted with a nondisplaced sacral fracture secondary to her accidental fall as well as volume overload with hypertensive urgency in the setting of missed hemodialysis sessions.  PT recommending SNF on discharge.  Nephrology following for hemodialysis. She is also noted to have multifocal pneumonia with elevated procalcitonin for which she will be started on antibiotics.  Sacral fracture nondisplaced secondary to accidental fall -PT evaluation with recommendation for SNF -Pain medications as needed for management  ESRD on HD TTS with missed hemodialysis sessions -Moderate volume ascites within the pelvis as well as diffuse anasarca -Hemodialysis per nephrology  Hypertensive urgency -Likely secondary to volume overload and missed hemodialysis sessions -Continue home antihypertensive medications -Hemodialysis should resolve  Multifocal pneumonia -Minimal symptoms attributed to this, but procalcitonin 2.18 -Started on Rocephin and azithromycin  History of HFpEF -2D echocardiogram 11/2017 with LVEF 60-65% and grade 1 diastolic dysfunction -Continue to monitor daily weights  Hyperglycemia secondary to type 2 diabetes -SSI  Elevated MCV -B12 and folate levels within normal limits  History of seizure disorder -Continue Keppra  Thrombocytopenia -Currently stable, continue to monitor  Total care time: 35 minutes

## 2020-06-08 NOTE — Plan of Care (Signed)
Discussed admission questions with patient and some teach back observed.  Problem: Education: Goal: Knowledge of General Education information will improve Description: Including pain rating scale, medication(s)/side effects and non-pharmacologic comfort measures Outcome: Progressing

## 2020-06-08 NOTE — Progress Notes (Signed)
Pt had incontinent episode at this time of stool and 1 emesis of snuff tobacco and undigested food. Pt educated about snuff use in hospital and cleansed/bathed at this time. Sacrum patch replaced as patient has stage 1 pressure ulcer upon arrival to unit from ICU. See assessment tab. Pt educated to this unit and how to call for help at this time. Callbell within reach.

## 2020-06-08 NOTE — Evaluation (Signed)
Physical Therapy Evaluation Patient Details Name: Erica [name] MRN: [id number] DOB: 05/15/1955 Today's Date: 06/08/2020   History of Present Illness  Erica [name] is a 65 y.o. female with medical history significant for seizures, ESRD on HD on TTS, type II DM, COPD, diastolic CHF, stroke and hypertension who presents to the emergency department due to fall sustained yesterday.  Patient states that she was walking with a walker on the sidewalk near her home when the walker slipped out from her hand and she lost her balance and fell.  She complained of left hip pain and required assistance to be able to get up.  Patient states that she missed her last 2 HD sessions (Tuesday- 4/19 and positive-4/21).  She denies chest pain, shortness of breath, fever or chills.  Patient was seen in the ED on 3/18 due to sacral contusion status post accidental fall at home she was discharged home from the ED with some pain medicine    Clinical Impression  Patient demonstrates slow labored movement for sitting up at bedside with c/o increasing sacral pain, has to support self with BUE to reduce pain, limited to a few slow labored unsteady steps at bedside due to generalized weakness, fatigue and sacral pain.  Patient tolerated sitting up in chair after therapy - RN aware.  Patient will benefit from continued physical therapy in hospital and recommended venue below to increase strength, balance, endurance for safe ADLs and gait.      Follow Up Recommendations SNF    Equipment Recommendations  None recommended by PT    Recommendations for Other Services       Precautions / Restrictions Precautions Precautions: Fall Restrictions Weight Bearing Restrictions: No      Mobility  Bed Mobility Overal bed mobility: Needs Assistance Bed Mobility: Supine to Sit     Supine to sit: Min assist     General bed mobility comments: increased time, labored movement    Transfers Overall transfer level:  Needs assistance Equipment used: Rolling walker (2 wheeled) Transfers: Sit to/from [facility] Sit to Stand: Mod assist Stand pivot transfers: Mod assist       General transfer comment: slow labored movement, unsteady  Ambulation/Gait Ambulation/Gait assistance: Mod assist Gait Distance (Feet): 5 Feet Assistive device: Rolling walker (2 wheeled) Gait Pattern/deviations: Decreased step length - right;Decreased step length - left;Decreased stride length;Wide base of support Gait velocity: decreased   General Gait Details: limited to 5-6 slow labored unsteady steps mostly due to c/o increase sacral pain  Stairs            Wheelchair Mobility    Modified Rankin (Stroke Patients Only)       Balance Overall balance assessment: Needs assistance Sitting-balance support: Feet supported;No upper extremity supported Sitting balance-Leahy Scale: Fair Sitting balance - Comments: fair without BUE support, fair/good with BUE support   Standing balance support: During functional activity;Bilateral upper extremity supported Standing balance-Leahy Scale: Poor Standing balance comment: fair/poor using RW                             Pertinent Vitals/Pain Pain Assessment: 0-10 Pain Score: 8  Pain Location: sacral area with  movement Pain Descriptors / Indicators: Aching;Sore;Grimacing Pain Intervention(s): Limited activity within patient's tolerance;Monitored during session;Repositioned    Home Living Family/patient expects to be discharged to:: Private residence Living Arrangements: Children Available Help at Discharge: Family;Available PRN/intermittently Type of Home: Apartment Home Access: Level entry  Home Layout: One level Home Equipment: [place] - 4 wheels;Cane - single point;Wheelchair - manual      Prior Function Level of Independence: Needs assistance   Gait / Transfers 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  ADL's /  Homemaking Assistance Needed: Patient able to complete most basic ADL, family assists PRN        Hand Dominance   Dominant Hand: Right    Extremity/Trunk Assessment   Upper Extremity Assessment Upper Extremity Assessment: Generalized weakness    Lower Extremity Assessment Lower Extremity Assessment: Generalized weakness    Cervical / Trunk Assessment Cervical / Trunk Assessment: Normal  Communication   Communication: No difficulties  Cognition Arousal/Alertness: Awake/alert Behavior During Therapy: WFL for tasks assessed/performed Overall Cognitive Status: Within Functional Limits for tasks assessed                                        General Comments      Exercises     Assessment/Plan    PT Assessment Patient needs continued PT services  PT Problem List Decreased strength;Decreased activity tolerance;Decreased balance;Decreased mobility       PT Treatment Interventions DME instruction;Gait training;Stair training;Functional mobility training;Therapeutic activities;Therapeutic exercise;Patient/family education;Balance training    PT Goals (Current goals can be found in the Care Plan section)  Acute Rehab PT Goals Patient Stated Goal: return home after rehab PT Goal Formulation: With patient Time For Goal Achievement: 06/22/20 Potential to Achieve Goals: Good    Frequency Min 3X/week   Barriers to discharge        Co-evaluation               AM-PAC PT "6 Clicks" Mobility  Outcome Measure Help needed turning from your back to your side while in a flat bed without using bedrails?: A Little Help needed moving from lying on your back to sitting on the side of a flat bed without using bedrails?: A Little Help needed moving to and from a bed to a chair (including a wheelchair)?: A Lot Help needed standing up from a chair using your arms (e.g., wheelchair or bedside chair)?: A Lot Help needed to walk in hospital room?: A Lot Help  needed climbing 3-5 steps with a railing? : A Lot 6 Click Score: 14    End of Session   Activity Tolerance: Patient tolerated treatment well;Patient limited by fatigue;Patient limited by pain Patient left: in chair;with call bell/phone within reach Nurse Communication: Mobility status PT Visit Diagnosis: Unsteadiness on feet (R26.81);Other abnormalities of gait and mobility (R26.89);Muscle weakness (generalized) (M62.81)    Time: [id number] PT Time Calculation (min) (ACUTE ONLY): 31 min   Charges:   PT Evaluation $PT Eval Moderate Complexity: 1 Mod PT Treatments $Therapeutic Activity: 23-37 mins        11:41 AM, 06/08/20 Lonell Grandchild, MPT Physical Therapist with [facility] 336 [phone] office [phone] mobile phone

## 2020-06-08 NOTE — ED Notes (Signed)
Secure messaged Dr. Cloyd Stagers r/t pt elevating BP, most recent 182/86 MAP 109, pt received 6.'25mg'$  coreg and '25mg'$  hydralazine at 0047. Awaiting any orders.

## 2020-06-08 NOTE — Progress Notes (Signed)
Patients fsbs was 61, 15 grams carb given at this time. Dialysis just completed. After 15 minutes, fsbs was 73. Patient eating dinner at this time. No distress noted.

## 2020-06-09 DIAGNOSIS — L8990 Pressure ulcer of unspecified site, unspecified stage: Secondary | ICD-10-CM | POA: Insufficient documentation

## 2020-06-09 DIAGNOSIS — L8996 Pressure-induced deep tissue damage of unspecified site: Secondary | ICD-10-CM | POA: Insufficient documentation

## 2020-06-09 LAB — BASIC METABOLIC PANEL
Anion gap: 14 (ref 5–15)
BUN: 37 mg/dL — ABNORMAL HIGH (ref 8–23)
CO2: 23 mmol/L (ref 22–32)
Calcium: 8.6 mg/dL — ABNORMAL LOW (ref 8.9–10.3)
Chloride: 93 mmol/L — ABNORMAL LOW (ref 98–111)
Creatinine, Ser: 4.15 mg/dL — ABNORMAL HIGH (ref 0.44–1.00)
GFR, Estimated: 11 mL/min — ABNORMAL LOW (ref >60)
Glucose, Bld: 100 mg/dL — ABNORMAL HIGH (ref 70–99)
Potassium: 4.1 mmol/L (ref 3.5–5.1)
Sodium: 130 mmol/L — ABNORMAL LOW (ref 135–145)

## 2020-06-09 LAB — CBC
HCT: 40.3 % (ref 36.0–46.0)
Hemoglobin: 13.1 g/dL (ref 12.0–15.0)
MCH: 34.5 pg — ABNORMAL HIGH (ref 26.0–34.0)
MCHC: 32.5 g/dL (ref 30.0–36.0)
MCV: 106.1 fL — ABNORMAL HIGH (ref 80.0–100.0)
Platelets: 108 10*3/uL — ABNORMAL LOW (ref 150–400)
RBC: 3.8 MIL/uL — ABNORMAL LOW (ref 3.87–5.11)
RDW: 12.5 % (ref 11.5–15.5)
WBC: 5.5 10*3/uL (ref 4.0–10.5)
nRBC: 0 % (ref 0.0–0.2)

## 2020-06-09 LAB — GLUCOSE, CAPILLARY
Glucose-Capillary: 134 mg/dL — ABNORMAL HIGH (ref 70–99)
Glucose-Capillary: 168 mg/dL — ABNORMAL HIGH (ref 70–99)
Glucose-Capillary: 33 mg/dL — CL (ref 70–99)
Glucose-Capillary: 81 mg/dL (ref 70–99)
Glucose-Capillary: 128 mg/dL — ABNORMAL HIGH (ref 70–99)

## 2020-06-09 LAB — MAGNESIUM: Magnesium: 2.1 mg/dL (ref 1.7–2.4)

## 2020-06-09 LAB — PROCALCITONIN: Procalcitonin: 2.4 ng/mL

## 2020-06-09 MED ORDER — HYDROMORPHONE HCL 1 MG/ML IJ SOLN: 0.5000 mg | INTRAMUSCULAR | Status: DC | PRN

## 2020-06-09 NOTE — Progress Notes (Signed)
PROGRESS NOTE    Erica [name]  [id number] DOB: 02-04-1956 DOA: 06/07/2020 PCP: [name], NP   Brief Narrative:   Erica Oms Reynoldsis a 65 y.o.femalewith medical history significant forseizures, ESRD on HD on TTS, type IIDM,COPD, diastolic CHF, stroke and hypertension who presents to the emergency department due tofall sustained yesterday.  Patient was admitted with a nondisplaced sacral fracture secondary to her accidental fall as well as volume overload with hypertensive urgency in the setting of missed hemodialysis sessions.  PT recommending SNF on discharge.  She has undergone hemodialysis on 4/23 and has been started on Rocephin and Azithromycin for multifocal pneumonia.  Assessment & Plan:   Active Problems:   Seizure disorder ([place])   Uncontrolled type 2 diabetes mellitus with hyperglycemia, with long-term current use of insulin (HCC)   Essential hypertension   ESRD (end stage renal disease) (HCC)   Sacral fracture (HCC)   Thrombocytopenia (HCC)   Hypertensive urgency   (HFpEF) heart failure with preserved ejection fraction (HCC)   Fall   Elevated MCV   Pressure injury of skin   Sacral fracture nondisplaced secondary to accidental fall -PT evaluation with recommendation for SNF -Pain medications as needed for management  ESRD on HD TTS with missed hemodialysis sessions -Moderate volume ascites within the pelvis as well as diffuse anasarca -Hemodialysis per nephrology  Hypertensive urgency-resolved -Likely secondary to volume overload and missed hemodialysis sessions -Continue home antihypertensive medications  Multifocal pneumonia -Minimal symptoms attributed to this, but procalcitonin 2.18 -Started on Rocephin and azithromycin  History of HFpEF -2D echocardiogram 11/2017 with LVEF 60-65% and grade 1 diastolic dysfunction -Continue to monitor daily weights  Hyperglycemia secondary to type 2 diabetes-improved -SSI  Elevated  MCV-stable -B12 and folate levels within normal limits  History of seizure disorder -Continue Keppra  Thrombocytopenia -Currently stable, continue to monitor on heparin   DVT prophylaxis:Heparin Code Status: Full Family Communication: Discussed with son on phone 4/24 Disposition Plan:  Status is: Inpatient  Remains inpatient appropriate because:IV treatments appropriate due to intensity of illness or inability to take PO and Inpatient level of care appropriate due to severity of illness   Dispo: The patient is from: Home              Anticipated d/c is to: SNF              Patient currently is not medically stable to d/c.   Difficult to place patient No  Skin Assessment:  I have examined the patient's skin and I agree with the wound assessment as performed by the wound care RN as outlined below:  Pressure Injury 06/08/20 Sacrum Mid Stage 1 -  Intact skin with non-blanchable redness of a localized area usually over a bony prominence. Non Blanchable Stage 1 Pressure Injury (Active)  06/08/20 1112  Location: Sacrum  Location Orientation: Mid  Staging: Stage 1 -  Intact skin with non-blanchable redness of a localized area usually over a bony prominence.  Wound Description (Comments): Non Blanchable Stage 1 Pressure Injury  Present on Admission: Yes    Consultants:   Nephrology  Procedures:   See below  Antimicrobials:  Anti-infectives (From admission, onward)   Start     Dose/Rate Route Frequency Ordered Stop   06/09/20 1000  azithromycin (ZITHROMAX) tablet 250 mg       "Followed by" Linked Group Details   250 mg Oral Daily 06/08/20 1310 06/13/20 0959   06/08/20 1400  cefTRIAXone (ROCEPHIN) 1 g in sodium chloride 0.9 %  100 mL IVPB        1 g 200 mL/hr over 30 Minutes Intravenous Every 24 hours 06/08/20 1310     06/08/20 1400  azithromycin (ZITHROMAX) tablet 500 mg       "Followed by" Linked Group Details   500 mg Oral Daily 06/08/20 1310 06/08/20 1645        Subjective: Patient seen and evaluated today with no new acute complaints or concerns. No acute concerns or events noted overnight.  She is having moderate pain to her low back and would like some pain medications for this.  Objective: Vitals:   06/08/20 1650 06/08/20 2333 06/09/20 0548 06/09/20 0912  BP: 128/62 (!) 126/54 (!) 128/58 (!) 120/56  Pulse: 74 78 88 73  Resp: '18 18 19   '$ Temp: 98.2 F (36.8 C) 99.7 F (37.6 C) 98.6 F (37 C)   TempSrc: Oral Oral Oral   SpO2:  98% 99% 98%  Weight:   49.8 kg   Height:        Intake/Output Summary (Last 24 hours) at 06/09/2020 1106 Last data filed at 06/09/2020 0700 Gross per 24 hour  Intake 300 ml  Output 2643 ml  Net -2343 ml   Filed Weights   06/08/20 1100 06/08/20 1325 06/09/20 0548  Weight: 48.6 kg 48.6 kg 49.8 kg    Examination:  General exam: Appears calm and comfortable  Respiratory system: Clear to auscultation. Respiratory effort normal. Cardiovascular system: S1 & S2 heard, RRR.  Gastrointestinal system: Abdomen is soft Central nervous system: Alert and awake Extremities: No edema Skin: No significant lesions noted Psychiatry: Flat affect.    Data Reviewed: I have personally reviewed following labs and imaging studies  CBC: Recent Labs  Lab 06/07/20 1744 06/08/20 0630 06/09/20 0502  WBC 5.4 7.1 5.5  NEUTROABS 3.9  --   --   HGB 12.1 12.2 13.1  HCT 38.3 38.5 40.3  MCV 109.4* 106.9* 106.1*  PLT 115* 112* 123XX123*   Basic Metabolic Panel: Recent Labs  Lab 06/07/20 1744 06/08/20 0630 06/09/20 0502  NA 130* 134* 130*  K 4.9 4.5 4.1  CL 95* 98 93*  CO2 '22 22 23  '$ GLUCOSE 554* 85 100*  BUN 68* 76* 37*  CREATININE 5.63* 5.89* 4.15*  CALCIUM 8.9 9.1 8.6*  MG  --  2.5* 2.1  PHOS  --  5.7*  --    GFR: Estimated Creatinine Clearance: 9.2 mL/min (A) (by C-G formula based on SCr of 4.15 mg/dL (H)). Liver Function Tests: Recent Labs  Lab 06/08/20 0630  AST 299*  ALT 229*  ALKPHOS 221*  BILITOT  0.7  PROT 6.7  ALBUMIN 2.9*   No results for input(s): LIPASE, AMYLASE in the last 168 hours. No results for input(s): AMMONIA in the last 168 hours. Coagulation Profile: Recent Labs  Lab 06/08/20 0630  INR 1.2   Cardiac Enzymes: No results for input(s): CKTOTAL, CKMB, CKMBINDEX, TROPONINI in the last 168 hours. BNP (last 3 results) No results for input(s): PROBNP in the last 8760 hours. HbA1C: No results for input(s): HGBA1C in the last 72 hours. CBG: Recent Labs  Lab 06/08/20 1111 06/08/20 1641 06/08/20 1714 06/08/20 2337 06/09/20 0808  GLUCAP 126* 61* 73 113* 134*   Lipid Profile: No results for input(s): CHOL, HDL, LDLCALC, TRIG, CHOLHDL, LDLDIRECT in the last 72 hours. Thyroid Function Tests: No results for input(s): TSH, T4TOTAL, FREET4, T3FREE, THYROIDAB in the last 72 hours. Anemia Panel: Recent Labs    06/08/20 0754  VITAMINB12 1,134*  FOLATE 60.5   Sepsis Labs: Recent Labs  Lab 06/08/20 0047 06/08/20 0313 06/08/20 0700 06/09/20 0502  PROCALCITON  --   --  2.18 2.40  LATICACIDVEN 1.5 1.0  --   --     Recent Results (from the past 240 hour(s))  Blood culture (routine x 2)     Status: None (Preliminary result)   Collection Time: 06/08/20 12:45 AM   Specimen: Right Antecubital; Blood  Result Value Ref Range Status   Specimen Description RIGHT ANTECUBITAL  Final   Special Requests   Final    BOTTLES DRAWN AEROBIC AND ANAEROBIC Blood Culture adequate volume   Culture   Final    NO GROWTH 1 DAY Performed at [facility], [address], [place], [place] [address]    Report Status PENDING  Incomplete  Blood culture (routine x 2)     Status: None (Preliminary result)   Collection Time: 06/08/20 12:57 AM   Specimen: BLOOD RIGHT HAND  Result Value Ref Range Status   Specimen Description BLOOD RIGHT HAND  Final   Special Requests   Final    BOTTLES DRAWN AEROBIC AND ANAEROBIC Blood Culture adequate volume   Culture   Final    NO GROWTH 1  DAY Performed at [facility], [address], [place], [place] [address]    Report Status PENDING  Incomplete  Resp Panel by RT-PCR (Flu A&B, Covid) Nasopharyngeal Swab     Status: None   Collection Time: 06/08/20  3:13 AM   Specimen: Nasopharyngeal Swab; Nasopharyngeal(NP) swabs in vial transport medium  Result Value Ref Range Status   SARS Coronavirus 2 by RT PCR NEGATIVE NEGATIVE Final    Comment: (NOTE) SARS-CoV-2 target nucleic acids are NOT DETECTED.  The SARS-CoV-2 RNA is generally detectable in upper respiratory specimens during the acute phase of infection. The lowest concentration of SARS-CoV-2 viral copies this assay can detect is 138 copies/mL. A negative result does not preclude SARS-Cov-2 infection and should not be used as the sole basis for treatment or other patient management decisions. A negative result may occur with  improper specimen collection/handling, submission of specimen other than nasopharyngeal swab, presence of viral mutation(s) within the areas targeted by this assay, and inadequate number of viral copies(<138 copies/mL). A negative result must be combined with clinical observations, patient history, and epidemiological information. The expected result is Negative.  Fact Sheet for Patients:  EntrepreneurPulse.com.au  Fact Sheet for Healthcare Providers:  IncredibleEmployment.be  This test is no t yet approved or cleared by the [place] FDA and  has been authorized for detection and/or diagnosis of SARS-CoV-2 by FDA under an Emergency Use Authorization (EUA). This EUA will remain  in effect (meaning this test can be used) for the duration of the COVID-19 declaration under Section 564(b)(1) of the Act, 21 U.S.C.section 360bbb-3(b)(1), unless the authorization is terminated  or revoked sooner.       Influenza A by PCR NEGATIVE NEGATIVE Final   Influenza B by PCR NEGATIVE NEGATIVE Final    Comment:  (NOTE) The Xpert Xpress SARS-CoV-2/FLU/RSV plus assay is intended as an aid in the diagnosis of influenza from Nasopharyngeal swab specimens and should not be used as a sole basis for treatment. Nasal washings and aspirates are unacceptable for Xpert Xpress SARS-CoV-2/FLU/RSV testing.  Fact Sheet for Patients: EntrepreneurPulse.com.au  Fact Sheet for Healthcare Providers: IncredibleEmployment.be  This test is not yet approved or cleared by the [place] FDA and has been authorized for detection  and/or diagnosis of SARS-CoV-2 by FDA under an Emergency Use Authorization (EUA). This EUA will remain in effect (meaning this test can be used) for the duration of the COVID-19 declaration under Section 564(b)(1) of the Act, 21 U.S.C. section 360bbb-3(b)(1), unless the authorization is terminated or revoked.  Performed at [facility], [address] [place], [place] [address]   MRSA PCR Screening     Status: None   Collection Time: 06/08/20  5:55 AM   Specimen: Nasal Mucosa; Nasopharyngeal  Result Value Ref Range Status   MRSA by PCR NEGATIVE NEGATIVE Final    Comment:        The GeneXpert MRSA Assay (FDA approved for NASAL specimens only), is one component of a comprehensive MRSA colonization surveillance program. It is not intended to diagnose MRSA infection nor to guide or monitor treatment for MRSA infections. Performed at [facility], [address] [place],  [address]          Radiology Studies: CT Head Wo Contrast  Result Date: 06/07/2020 CLINICAL DATA:  Fall with head trauma EXAM: CT HEAD WITHOUT CONTRAST TECHNIQUE: Contiguous axial images were obtained from the base of the skull through the vertex without intravenous contrast. COMPARISON:  Head CT December 12, 2018 FINDINGS: Brain: No evidence of acute large vascular territory infarction, hemorrhage, hydrocephalus, extra-axial collection or mass lesion/mass effect.  Moderate age related cortical atrophy. Similar mild burden of chronic ischemic white matter disease. Vascular: No hyperdense vessel. Atherosclerotic calcifications of the internal carotid arteries at the skull base. Skull: Hyperostosis frontalis. Negative for fracture or focal lesion. Sinuses/Orbits: The paranasal sinuses and mastoid air cells are predominantly clear. Other: None IMPRESSION: 1. No acute intracranial findings. 2. Similar moderate age related cortical atrophy and mild chronic ischemic white matter disease. Electronically Signed   By: Dahlia Bailiff MD   On: 06/07/2020 17:55   CT PELVIS WO CONTRAST  Result Date: 06/07/2020 CLINICAL DATA:  Pelvic pain after fall EXAM: CT PELVIS WITHOUT CONTRAST TECHNIQUE: Multidetector CT imaging of the pelvis was performed following the standard protocol without intravenous contrast. COMPARISON:  05/03/2020, 03/21/2020 FINDINGS: Urinary Tract: Urinary bladder obscured by metallic streak artifact from patient's hip prosthesis. Bowel: Large rectal stool ball. No bowel obstruction or evidence of acute bowel inflammation. Vascular/Lymphatic: Extensive vascular calcifications. No pelvic or inguinal lymphadenopathy. Reproductive:  No acute abnormality. Other: Moderate volume ascites seen within the pelvis. Diffuse anasarca. Musculoskeletal: Subacute transversely oriented nondisplaced fracture through the sacrum at the S4 level (series 6, images 10 9-112), new from prior CT. SI joints and pubic symphysis intact without diastasis. Left hip is intact without fracture or dislocation. Partially visualized right hip arthroplasty hardware without apparent complication. IMPRESSION: 1. Subacute appearing transversely oriented nondisplaced fracture through the sacrum at the S4 level, new from prior CT. 2. Moderate volume ascites within the pelvis. Diffuse anasarca. 3. Large rectal stool ball. Electronically Signed   By: Davina Poke D.O.   On: 06/07/2020 18:13   DG Chest  Portable 1 View  Result Date: 06/08/2020 CLINICAL DATA:  Fever, wheezing, shortness of breath EXAM: PORTABLE CHEST 1 VIEW COMPARISON:  11/15/2019 FINDINGS: Mild cardiac enlargement. Shallow inspiration. Patchy perihilar infiltrates with peribronchial thickening. Possibly due to airways disease or multifocal pneumonia. No pleural effusions. No pneumothorax. Mediastinal contours appear intact. Postoperative changes in the right shoulder. IMPRESSION: Patchy perihilar infiltrates with peribronchial thickening suggesting airways disease or multifocal pneumonia. Electronically Signed   By: Lucienne Capers M.D.   On: 06/08/2020 01:38  Scheduled Meds: . azithromycin  250 mg Oral Daily  . calcitRIOL  0.25 mcg Oral Q T,Th,Sa-HD  . carvedilol  6.25 mg Oral BID  . Chlorhexidine Gluconate Cloth  6 each Topical Q0600  . Chlorhexidine Gluconate Cloth  6 each Topical Q0600  . ferric citrate  420 mg Oral TID WC  . folic acid  1 mg Oral q morning  . heparin  5,000 Units Subcutaneous Q8H  . hydrALAZINE  25 mg Oral Q8H  . insulin aspart  0-6 Units Subcutaneous TID WC  . insulin aspart  2 Units Subcutaneous TID WC  . levETIRAcetam  1,000 mg Oral q morning  . losartan  25 mg Oral Daily  . NIFEdipine  60 mg Oral q morning  . polyethylene glycol  17 g Oral Daily  . polyvinyl alcohol  1 drop Both Eyes BID  . rOPINIRole  1 mg Oral TID   Continuous Infusions: . sodium chloride    . sodium chloride    . cefTRIAXone (ROCEPHIN)  IV 1 g (06/08/20 1422)     LOS: 1 day    Time spent: 35 minutes    Vuk Skillern D Manuella Ghazi, DO [facility]  If 7PM-7AM, please contact night-coverage [email or web address] 06/09/2020, 11:06 AM

## 2020-06-09 NOTE — NC FL2 (Signed)
[place] MEDICAID FL2 LEVEL OF CARE SCREENING TOOL     IDENTIFICATION  Patient Name: Erica [name] Birthdate: [birth year] Sex: female Admission Date (Current Location): 06/07/2020  [facility] and [place] Number:  [employer] and Address:  [facility] [address], [place]      Provider Number: [id number]  Attending Physician Name and Address:  Rodena Goldmann, DO  Relative Name and Phone Number:  Lorenda Hatchet)   [phone]    Current Level of Care: Hospital Recommended Level of Care: [place] Prior Approval Number:    Date Approved/Denied:   PASRR Number: [id number]  Discharge Plan: SNF    Current Diagnoses: Patient Active Problem List   Diagnosis Date Noted  . Pressure injury of skin 06/09/2020  . Sacral fracture ([place]) 06/08/2020  . Thrombocytopenia ([place]) 06/08/2020  . Hypertensive urgency 06/08/2020  . (HFpEF) heart failure with preserved ejection fraction ([place]) 06/08/2020  . Fall 06/08/2020  . Elevated MCV 06/08/2020  . Palliative care by specialist   . Generalized weakness 11/14/2019  . HCAP (healthcare-associated pneumonia) 11/14/2019  . Hyperkalemia 11/13/2019  . Hyponatremia 09/27/2019  . Closed fracture of right proximal humerus 04/20/2019  . Surgery, elective   . Seizure ([place]) 11/12/2018  . History of hydronephrosis --stents in place 12/08/2017  . Stroke ([place]) 11/29/2017  . Seizures ([place])   . Hydronephrosis   . Recurrent UTI   . Diabetes mellitus type 2 in nonobese (HCC)   . ESRD (end stage renal disease) ([place])   . Anemia of chronic disease   . Chronic diastolic congestive heart failure ([place])   . Essential hypertension   . PICC (peripherally inserted central catheter) in place   . Acute pyelonephritis 11/12/2017  . Uncontrolled type 2 diabetes mellitus with hyperglycemia, with long-term current use of insulin ([place]) 11/10/2017  . Renal failure 11/08/2017  . Seizure disorder ([place])  11/08/2017  . Orthostatic hypotension 07/25/2014  . Moderate protein malnutrition ([place]) 09/15/2013  . Aspiration pneumonia ([place]) 09/15/2013  . Acute respiratory failure requiring reintubation ([place]) 09/11/2013  . probable Seizures due to metabolic disorder [id number]  . Lactic acidosis 03/19/2013  . Abdominal pain 03/19/2013  . Rotavirus infection 10/29/2012  . Type II or unspecified type diabetes mellitus without mention of complication, uncontrolled 10/29/2012  . Protein-calorie malnutrition, severe ([place]) 10/27/2012  . NSTEMI (non-ST elevated myocardial infarction) ([place]) 10/26/2012  . Fever, unspecified 10/26/2012  . Hypotension 10/25/2012  . Metabolic acidosis [id number]  . Chronic diarrhea 10/25/2012  . Tobacco abuse 10/25/2012  . DKA (diabetic ketoacidoses) 09/09/2012  . Dehydration 09/09/2012  . DKA, type 2 ([place]) 05/20/2012  . Abnormal LFTs 05/20/2012  . Heart murmur, systolic [id number]  . Hypoglycemia 07/08/2011  . Metabolic encephalopathy [id number]  . Alcohol abuse 07/08/2011  . Hypokalemia 07/08/2011  . Nausea & vomiting 07/08/2011  . H/O chronic pancreatitis 07/08/2011    Orientation RESPIRATION BLADDER Height & Weight     Self,Time,Situation,Place  Normal Incontinent Weight: 109 lb 12.6 oz (49.8 kg) Height:  '4\' 11"'$  (149.9 cm)  BEHAVIORAL SYMPTOMS/MOOD NEUROLOGICAL BOWEL NUTRITION STATUS      Continent Diet (Regular diet)  AMBULATORY STATUS COMMUNICATION OF NEEDS Skin   Extensive Assist Verbally Normal                       Personal Care Assistance Level of Assistance  Bathing,Feeding,Dressing,Total care Bathing Assistance: Maximum assistance Feeding assistance: Independent Dressing Assistance: Maximum assistance Total Care Assistance: Maximum assistance  Functional Limitations Info  Sight,Hearing,Speech Sight Info: Adequate Hearing Info: Adequate Speech Info: Adequate    SPECIAL CARE FACTORS FREQUENCY  PT (By licensed PT),OT (By licensed OT)      PT Frequency: 5x OT Frequency: 5x            Contractures Contractures Info: Not present    Additional Factors Info  Code Status,Allergies Code Status Info: FULL Allergies Info: Aspirin           Current Medications (06/09/2020):  This is the current hospital active medication list Current Facility-Administered Medications  Medication Dose Route Frequency Provider Last Rate Last Admin  . 0.9 %  sodium chloride infusion  100 mL Intravenous PRN Edrick Oh, MD      . 0.9 %  sodium chloride infusion  100 mL Intravenous PRN Edrick Oh, MD      . acetaminophen (TYLENOL) tablet 650 mg  650 mg Oral Q4H PRN Adefeso, Oladapo, DO      . albuterol (VENTOLIN HFA) 108 (90 Base) MCG/ACT inhaler 2 puff  2 puff Inhalation Q4H PRN Adefeso, Oladapo, DO      . azithromycin (ZITHROMAX) tablet 250 mg  250 mg Oral Daily Manuella Ghazi, Pratik D, DO   250 mg at 06/09/20 0906  . calcitRIOL (ROCALTROL) capsule 0.25 mcg  0.25 mcg Oral Q T,Th,Sa-HD Adefeso, Oladapo, DO   0.25 mcg at 06/08/20 1051  . carvedilol (COREG) tablet 6.25 mg  6.25 mg Oral BID Adefeso, Oladapo, DO   6.25 mg at 06/09/20 0907  . cefTRIAXone (ROCEPHIN) 1 g in sodium chloride 0.9 % 100 mL IVPB  1 g Intravenous Q24H Manuella Ghazi, Pratik D, DO 200 mL/hr at 06/08/20 1422 1 g at 06/08/20 1422  . Chlorhexidine Gluconate Cloth 2 % PADS 6 each  6 each Topical Q0600 Adefeso, Oladapo, DO   6 each at 06/08/20 [phone]  . Chlorhexidine Gluconate Cloth 2 % PADS 6 each  6 each Topical Q0600 Edrick Oh, MD   6 each at 06/09/20 0630  . ferric citrate (AURYXIA) tablet 420 mg  420 mg Oral TID WC Adefeso, Oladapo, DO   420 mg at 06/08/20 1153  . folic acid (FOLVITE) tablet 1 mg  1 mg Oral q morning Adefeso, Oladapo, DO   1 mg at 06/09/20 0906  . heparin injection 5,000 Units  5,000 Units Subcutaneous Q8H Adefeso, Oladapo, DO      . hydrALAZINE (APRESOLINE) tablet 25 mg  25 mg Oral Q8H Adefeso, Oladapo, DO   25 mg at 06/09/20 0630  . HYDROcodone-acetaminophen  (NORCO/VICODIN) 5-325 MG per tablet 1 tablet  1 tablet Oral Q6H PRN Adefeso, Oladapo, DO   1 tablet at 06/08/20 1045  . HYDROmorphone (DILAUDID) injection 0.5 mg  0.5 mg Intravenous Q3H PRN Manuella Ghazi, Pratik D, DO      . insulin aspart (novoLOG) injection 0-6 Units  0-6 Units Subcutaneous TID WC Adefeso, Oladapo, DO      . insulin aspart (novoLOG) injection 2 Units  2 Units Subcutaneous TID WC Adefeso, Oladapo, DO   2 Units at 06/09/20 0920  . levETIRAcetam (KEPPRA) tablet 1,000 mg  1,000 mg Oral q morning Adefeso, Oladapo, DO   1,000 mg at 06/09/20 0906  . lidocaine (PF) (XYLOCAINE) 1 % injection 5 mL  5 mL Intradermal PRN Edrick Oh, MD      . lidocaine-prilocaine (EMLA) cream 1 application  1 application Topical PRN Edrick Oh, MD      . losartan (COZAAR) tablet 25 mg  25 mg Oral Daily  Bernadette Hoit, DO   25 mg at 06/09/20 0906  . metoCLOPramide (REGLAN) tablet 5 mg  5 mg Oral Q6H PRN Adefeso, Oladapo, DO      . NIFEdipine (PROCARDIA-XL/NIFEDICAL-XL) 24 hr tablet 60 mg  60 mg Oral q morning Adefeso, Oladapo, DO      . ondansetron (ZOFRAN-ODT) disintegrating tablet 4 mg  4 mg Oral Q8H PRN Adefeso, Oladapo, DO      . pentafluoroprop-tetrafluoroeth (GEBAUERS) aerosol 1 application  1 application Topical PRN Edrick Oh, MD      . polyethylene glycol (MIRALAX / GLYCOLAX) packet 17 g  17 g Oral Daily Adefeso, Oladapo, DO   17 g at 06/09/20 0919  . polyvinyl alcohol (LIQUIFILM TEARS) 1.4 % ophthalmic solution 1 drop  1 drop Both Eyes BID Adefeso, Oladapo, DO   1 drop at 06/08/20 2144  . rOPINIRole (REQUIP) tablet 1 mg  1 mg Oral TID Adefeso, Oladapo, DO   1 mg at 06/09/20 [id number]     Discharge Medications: Please see discharge summary for a list of discharge medications.  Relevant Imaging Results:  Relevant Lab Results:   Additional Information [phone]. Dialysis patient - TTS [place], LCSW

## 2020-06-09 NOTE — Progress Notes (Signed)
Ultrafiltration 2.5 L   06/08/2020  Blood pressure 120/56 pulse 73 temperature 98.6 O2 sats 98% room air  Sodium 130 potassium 4.1 chloride 93 CO2 23 BUN 37 creatinine 4.18 glucose 100 calcium 8.6 magnesium 2.1 phosphorus 5.7 hemoglobin 13.1  Patient will be seen 06/10/2020

## 2020-06-09 NOTE — Progress Notes (Signed)
CRITICAL VALUE S  CRITICAL VALUE: Glucose 33 - Juice with sugar given and glucose came up to 80.  DATE & TIME NOTIFIED: 4/24 @ 1730  MD NOTIFIED: MD Manuella Ghazi  TIME OF NOTIFICATION: [id number]  RESPONSE: Adjusted novolog insulin.

## 2020-06-10 LAB — BASIC METABOLIC PANEL
Anion gap: 16 — ABNORMAL HIGH (ref 5–15)
BUN: 48 mg/dL — ABNORMAL HIGH (ref 8–23)
CO2: 21 mmol/L — ABNORMAL LOW (ref 22–32)
Calcium: 8.8 mg/dL — ABNORMAL LOW (ref 8.9–10.3)
Chloride: 93 mmol/L — ABNORMAL LOW (ref 98–111)
Creatinine, Ser: 4.99 mg/dL — ABNORMAL HIGH (ref 0.44–1.00)
GFR, Estimated: 9 mL/min — ABNORMAL LOW (ref >60)
Glucose, Bld: 351 mg/dL — ABNORMAL HIGH (ref 70–99)
Potassium: 4.7 mmol/L (ref 3.5–5.1)
Sodium: 130 mmol/L — ABNORMAL LOW (ref 135–145)

## 2020-06-10 LAB — GLUCOSE, CAPILLARY
Glucose-Capillary: 324 mg/dL — ABNORMAL HIGH (ref 70–99)
Glucose-Capillary: 344 mg/dL — ABNORMAL HIGH (ref 70–99)
Glucose-Capillary: 304 mg/dL — ABNORMAL HIGH (ref 70–99)
Glucose-Capillary: 237 mg/dL — ABNORMAL HIGH (ref 70–99)

## 2020-06-10 LAB — MAGNESIUM: Magnesium: 2.5 mg/dL — ABNORMAL HIGH (ref 1.7–2.4)

## 2020-06-10 MED ORDER — CEFDINIR 300 MG PO CAPS: 300.0000 mg | ORAL_CAPSULE | Freq: Every day | ORAL | 0 refills | Status: AC

## 2020-06-10 MED ORDER — HYDROCODONE-ACETAMINOPHEN 5-325 MG PO TABS: 1.0000 | ORAL_TABLET | ORAL | 0 refills | Status: DC | PRN

## 2020-06-10 MED ORDER — CEFDINIR 300 MG PO CAPS
300.0000 mg | ORAL_CAPSULE | Freq: Every day | ORAL | Status: DC
  Administered 2020-06-10 – 2020-06-11 (×2): 300 mg via ORAL
  Filled 2020-06-10 (×2): qty 1

## 2020-06-10 MED ORDER — AZITHROMYCIN 250 MG PO TABS: 250.0000 mg | ORAL_TABLET | Freq: Every day | ORAL | 0 refills | Status: AC

## 2020-06-10 MED ORDER — CEFDINIR 300 MG PO CAPS: 300.0000 mg | ORAL_CAPSULE | Freq: Two times a day (BID) | ORAL | 0 refills | Status: DC

## 2020-06-10 NOTE — Progress Notes (Signed)
Pt experienced an assisted fall att. Getting up with x1 assist to the [facility], one leg buckled beneath her and she slowly fell to the ground. She was then assisted back up by 2 staff members and sat on the commode. No injuries or skin tears, is not c/o the same pain she was in prior to the fall. VSS. On call notified. Family to be notified. Will continue to monitor.

## 2020-06-10 NOTE — TOC Progression Note (Signed)
Transition of Care [facility]) - Progression Note    Patient Details  Name: Erica [name] MRN: [id number] Date of Birth: 15-Oct-1955  [facility]) CM/SW Contact  Shade Flood, LCSW Phone Number: 06/10/2020, 3:46 PM  Clinical Narrative:     TOC following. Spoke with Melissa at [facility] several times today regarding pt's referral and was ultimatley informed that they cannot accept her at this time as they are having their floors replaced and they don't have any bed availability. Updated pt's son and discussed alternative options. Referred out and awaiting response. Pt will need to have her HD here tomorrow and then hopefully dc after that. Updated MD, RN, and [facility]. Will follow up in AM.  Expected Discharge Plan: [place] Barriers to Discharge: SNF Pending bed offer  Expected Discharge Plan and Services Expected Discharge Plan: [place] In-house Referral: Clinical Social Work Discharge Planning Services: CM Consult Post Acute Care Choice: [place] Living arrangements for the past 2 months: Apartment Expected Discharge Date: 06/10/20                                     Social Determinants of Health (SDOH) Interventions    Readmission Risk Interventions Readmission Risk Prevention Plan 11/19/2019 11/15/2019 11/14/2019  Transportation Screening Complete - Complete  Medication Review Press photographer) Complete - Complete  PCP or Specialist appointment within 3-5 days of discharge Complete Complete -  [place] or Home Care Consult Complete - Complete  SW Recovery Care/Counseling Consult Complete - Complete  Palliative Care Screening Complete - Not Applicable  Skilled Nursing Facility Complete - Not Applicable  Some recent data might be hidden

## 2020-06-10 NOTE — Progress Notes (Signed)
Patient ID: Erica [name], female   DOB: [birth year], 65 y.o.   MRN: [id number]  [facility] Progress Note    Subjective:    Sitting up eating breakfast.  Has some discomfort but appears comfortable.   Objective:   BP 125/60 (BP Location: Left Leg)   Pulse 76   Temp 98.7 F (37.1 C) (Oral)   Resp 18   Ht '4\' 11"'$  (1.499 m)   Wt 47.5 kg   SpO2 98%   BMI 21.15 kg/m   Intake/Output: I/O last 3 completed shifts: In: 700 [P.O.:500; IV Piggyback:200] Out: -    Intake/Output this shift:  No intake/output data recorded. Weight change: -1.1 kg  Physical Exam: Gen: NAD CVS: RRR Resp: CTA  Abd: +BS, soft, NT/ND Ext: no edema, LUE AVF +T/B  Labs: BMET Recent Labs  Lab 06/07/20 1744 06/08/20 0630 06/09/20 0502 06/10/20 0621  NA 130* 134* 130* 130*  K 4.9 4.5 4.1 4.7  CL 95* 98 93* 93*  CO2 '22 22 23 '$ 21*  GLUCOSE 554* 85 100* 351*  BUN 68* 76* 37* 48*  CREATININE 5.63* 5.89* 4.15* 4.99*  ALBUMIN  --  2.9*  --   --   CALCIUM 8.9 9.1 8.6* 8.8*  PHOS  --  5.7*  --   --    CBC Recent Labs  Lab 06/07/20 1744 06/08/20 0630 06/09/20 0502  WBC 5.4 7.1 5.5  NEUTROABS 3.9  --   --   HGB 12.1 12.2 13.1  HCT 38.3 38.5 40.3  MCV 109.4* 106.9* 106.1*  PLT 115* 112* 108*      Medications:    . azithromycin  250 mg Oral Daily  . calcitRIOL  0.25 mcg Oral Q T,Th,Sa-HD  . carvedilol  6.25 mg Oral BID  . Chlorhexidine Gluconate Cloth  6 each Topical Q0600  . Chlorhexidine Gluconate Cloth  6 each Topical Q0600  . ferric citrate  420 mg Oral TID WC  . folic acid  1 mg Oral q morning  . heparin  5,000 Units Subcutaneous Q8H  . hydrALAZINE  25 mg Oral Q8H  . insulin aspart  0-6 Units Subcutaneous TID WC  . levETIRAcetam  1,000 mg Oral q morning  . losartan  25 mg Oral Daily  . NIFEdipine  60 mg Oral q morning  . polyethylene glycol  17 g Oral Daily  . polyvinyl alcohol  1 drop Both Eyes BID  . rOPINIRole  1 mg Oral TID   OUTPATIENT DIALYSIS:  [place]  [place] on TTS EDW 43.5kg, Time: 3.75 hours, 2K/ 2.5 Ca, BFR 400, DFR 600, Dialyzer: [place] Revacir 200, LUE AVF Heparin 1000 unit load, 500 units/hr (stop 60 min before end of HD)  Assessment/ Plan:   1. Sacral fracture, nondisplaced s/p mechanical fall - PT recommending SND placement.    2. ESRD - continue with TTS schedule 3. 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 Nutrition:renal diet 6. Hypertension: improved with HD and UF. 7. Disposition :  Pending SNF placement.  Ok for outpatient HD as she can sit in a chair without issue at this time.   Donetta Potts, MD [place] Pager [phone] 06/10/2020, 9:10 AM

## 2020-06-10 NOTE — Evaluation (Signed)
Occupational Therapy Evaluation Patient Details Name: Erica [name] MRN: [id number] DOB: [birth year] Today's Date: 06/10/2020    History of Present Illness Erica [name] is a 65 y.o. female with medical history significant for seizures, ESRD on HD on TTS, type II DM, COPD, diastolic CHF, stroke and hypertension who presents to the emergency department due to fall sustained yesterday.  Patient states that she was walking with a walker on the sidewalk near her home when the walker slipped out from her hand and she lost her balance and fell.  She complained of left hip pain and required assistance to be able to get up.  Patient states that she missed her last 2 HD sessions (Tuesday- 4/19 and positive-4/21).  She denies chest pain, shortness of breath, fever or chills.  Patient was seen in the ED on 3/18 due to sacral contusion status post accidental fall at home she was discharged home from the ED with some pain medicine   Clinical Impression   Pt agreeable to OT evaluation this date. Pt reporting 9/10 L leg and sacral pain. Pt requires extended time and Min A for sit to stand from EOB and functional transfers from EOB to bedside commode. Pt presenting with B UE generalized weakness. Pt will benefit from continued OT in the hospital and recommended venue below to increase strength, balance, and endurance for safe ADL's.     Follow Up Recommendations  SNF    Equipment Recommendations  None recommended by OT           Precautions / Restrictions Precautions Precautions: Fall Restrictions Weight Bearing Restrictions: No      Mobility Bed Mobility Overal bed mobility: Needs Assistance Bed Mobility: Supine to Sit;Sit to Supine     Supine to sit: Modified independent (Device/Increase time) Sit to supine: Modified independent (Device/Increase time)   General bed mobility comments: slow labored movement.    Transfers Overall transfer level: Needs assistance Equipment used:  Rolling walker (2 wheeled) Transfers: Sit to/from [facility] Sit to Stand: Min assist Stand pivot transfers: Min assist       General transfer comment: slow labored movement, unsteady    Balance Overall balance assessment: Needs assistance Sitting-balance support: Feet supported;No upper extremity supported Sitting balance-Leahy Scale: Good Sitting balance - Comments: Good seated at EOB with feet supported.   Standing balance support: During functional activity;Bilateral upper extremity supported Standing balance-Leahy Scale: Poor Standing balance comment: fair/poor using RW                           ADL either performed or assessed with clinical judgement   ADL Overall ADL's : Needs assistance/impaired                         Toilet Transfer: Minimal assistance;RW;Stand-pivot;BSC Toilet Transfer Details (indicate cue type and reason): EOB to bedside commode; slow labored movement.                 Vision Baseline Vision/History:  (Pt reports she needs glasses.) Patient Visual Report: Blurring of vision (Pt reports having blurred vision at a distance at baseline.)                  Pertinent Vitals/Pain Pain Assessment: 0-10 Pain Score: 9  Pain Location: From L foot to L sacral area. Pain Descriptors / Indicators: Grimacing Pain Intervention(s): Limited activity within patient's tolerance;Monitored during session     Hand  Dominance Right   Extremity/Trunk Assessment Upper Extremity Assessment Upper Extremity Assessment: Generalized weakness   Lower Extremity Assessment Lower Extremity Assessment: Defer to PT evaluation   Cervical / Trunk Assessment Cervical / Trunk Assessment: Normal   Communication Communication Communication: No difficulties   Cognition Arousal/Alertness: Awake/alert Behavior During Therapy: WFL for tasks assessed/performed Overall Cognitive Status: Within Functional Limits for tasks assessed                                                       Home Living Family/patient expects to be discharged to:: Private residence Living Arrangements: Children Available Help at Discharge: Family;Available PRN/intermittently Type of Home: Apartment Home Access: Level entry     Home Layout: One level     Bathroom Shower/Tub: Teacher, early years/pre: Standard Bathroom Accessibility: No   Home Equipment: Walker - 4 wheels;Cane - single point;Wheelchair - [employer] - 2 wheels          Prior Functioning/Environment Level of Independence: Needs assistance  Gait / Transfers 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 ADL's / Homemaking Assistance Needed: Patient able to complete most basic ADL, family assists PRN            OT Problem List: Decreased strength;Decreased range of motion;Decreased activity tolerance;Impaired balance (sitting and/or standing);Pain      OT Treatment/Interventions: Self-care/ADL training;Therapeutic exercise;Therapeutic activities;Balance training;Patient/family education    OT Goals(Current goals can be found in the care plan section) Acute Rehab OT Goals Patient Stated Goal: return home OT Goal Formulation: With patient Time For Goal Achievement: 06/24/20 Potential to Achieve Goals: Good  OT Frequency: Min 2X/week                                              End of Session Equipment Utilized During Treatment: Gait belt;Rolling walker  Activity Tolerance: Patient limited by pain Patient left: in bed;with call bell/phone within reach;with bed alarm set;with nursing/sitter in room  OT Visit Diagnosis: Unsteadiness on feet (R26.81);Repeated falls (R29.6);Muscle weakness (generalized) (M62.81);History of falling (Z91.81);Pain Pain - Right/Left: Left Pain - part of body: Leg (and sacral area)                Time: [id number] OT Time Calculation (min): 16 min Charges:  OT General  Charges $OT Visit: 1 Visit OT Evaluation $OT Eval Low Complexity: 1 Low  Kendy Haston OT, MOT  Larey Seat 06/10/2020, 12:14 PM

## 2020-06-10 NOTE — Care Management Important Message (Signed)
Important Message  Patient Details  Name: Erica [name] MRN: [id number] Date of Birth: [birth year]   Medicare Important Message Given:  Yes     Tommy Medal 06/10/2020, 12:22 PM

## 2020-06-10 NOTE — Plan of Care (Signed)
  Problem: Acute Rehab OT Goals (only OT should resolve) Goal: Pt. Will Perform Grooming Flowsheets (Taken 06/10/2020 1216) Pt Will Perform Grooming:  with supervision  standing Goal: Pt. Will Perform Lower Body Dressing Flowsheets (Taken 06/10/2020 1216) Pt Will Perform Lower Body Dressing:  with supervision  sitting/lateral leans  with adaptive equipment  sit to/from stand Goal: Pt. Will Transfer To Toilet Flowsheets (Taken 06/10/2020 1216) Pt Will Transfer to Toilet:  with modified independence  with supervision  stand pivot transfer  grab bars  ambulating  bedside commode Goal: Pt/Caregiver Will Perform Home Exercise Program Flowsheets (Taken 06/10/2020 1216) Pt/caregiver will Perform Home Exercise Program:  Increased ROM  Increased strength  Both right and left upper extremity  Independently  Evan Mackie OT, MOT

## 2020-06-10 NOTE — Discharge Summary (Addendum)
Physician Discharge Summary  MADELEY ZAPALAC [id number] DOB: 11-30-1955 DOA: 06/07/2020  PCP: [name], NP  Admit date: 06/07/2020  Discharge date: 06/10/2020  Admitted From:Home  Disposition:  SNF  Recommendations for Outpatient Follow-up:  1. Follow up with PCP in 1-2 weeks 2. Follow-up with routine hemodialysis Tuesday, Thursday, Saturday. Next session on 4/26. 3. Continue home pain medications from management of symptoms related to sacral fracture as prescribed 4. Finish course of antibiotics with azithromycin and Omnicef as prescribed for multifocal pneumonia 5. Continue other home medications as prior  Home Health: None  Equipment/Devices: None  Discharge Condition:Stable  CODE STATUS: Full  Diet recommendation: Heart Healthy/carb modified  Brief/Interim Summary: Kailin B Reynoldsis a 65 y.o.femalewith medical history significant forseizures, ESRD on HD on TTS, type IIDM,COPD, diastolic CHF, stroke and hypertension who presents to the emergency department due tofall sustained yesterday.Patient was admitted with a nondisplaced sacral fracture secondary to her accidental fall as well as volume overload with hypertensive urgency in the setting of missed hemodialysis sessions. PT recommending SNF on discharge.  She has undergone hemodialysis on 4/23 and has been started on Rocephin and Azithromycin for multifocal pneumonia also noted on her initial CXR.  She is currently in stable condition for discharge and does not appear to be volume overloaded and her blood pressures are controlled today nephrology plans to continue routine hemodialysis starting back on 4/26.  She is in stable condition to discharge to rehab facility and continue pain medications as prescribed.  Discharge Diagnoses:  Active Problems:   Seizure disorder ([place])   Uncontrolled type 2 diabetes mellitus with hyperglycemia, with long-term current use of insulin (HCC)   Essential  hypertension   ESRD (end stage renal disease) (HCC)   Sacral fracture (HCC)   Thrombocytopenia (HCC)   Hypertensive urgency   (HFpEF) heart failure with preserved ejection fraction (HCC)   Fall   Elevated MCV   Pressure injury of skin  Principal discharge diagnosis: Sacral fracture, nondisplaced secondary to accidental fall.  Discharge Instructions  Discharge Instructions    Diet - low sodium heart healthy   Complete by: As directed    Increase activity slowly   Complete by: As directed    No wound care   Complete by: As directed      Allergies as of 06/10/2020      Reactions   Aspirin Palpitations   Listed on [facility] 11/12/18      Medication List    STOP taking these medications   dextrose 40 % Gel Commonly known as: GLUTOSE   ondansetron 8 MG disintegrating tablet Commonly known as: Zofran ODT   phenytoin 50 MG tablet Commonly known as: Dilantin Infatabs   senna 8.6 MG Tabs tablet Commonly known as: SENOKOT   topiramate 25 MG tablet Commonly known as: TOPAMAX     TAKE these medications   acetaminophen 325 MG tablet Commonly known as: TYLENOL Take 650 mg by mouth every 4 (four) hours as needed for moderate pain.   albuterol 108 (90 Base) MCG/ACT inhaler Commonly known as: VENTOLIN HFA Inhale 2 puffs into the lungs every 4 (four) hours as needed for wheezing or shortness of breath.   Artificial Tears 1 % ophthalmic solution Generic drug: carboxymethylcellulose Place 1 drop into both eyes daily as needed (dry eyes).   azithromycin 250 MG tablet Commonly known as: ZITHROMAX Take 1 tablet (250 mg total) by mouth daily for 2 days.   calcitRIOL 0.25 MCG capsule Commonly known as: ROCALTROL Take 1  capsule (0.25 mcg total) by mouth Every Tuesday,Thursday,and Saturday with dialysis.   carvedilol 6.25 MG tablet Commonly known as: COREG Take 1 tablet (6.25 mg total) by mouth 2 (two) times daily.   cefdinir 300 MG capsule Commonly known as: OMNICEF Take 1  capsule (300 mg total) by mouth daily for 4 days.   cetaphil cream Apply 1 application topically every 12 (twelve) hours as needed (dry skin).   cinacalcet 30 MG tablet Commonly known as: SENSIPAR Take 30 mg by mouth every evening.   ferric citrate 1 GM 210 MG(Fe) tablet Commonly known as: AURYXIA Take 2 tablets (420 mg total) by mouth 3 (three) times daily with meals.   folic acid 1 MG tablet Commonly known as: FOLVITE Take 1 mg by mouth every morning.   hydrALAZINE 25 MG tablet Commonly known as: APRESOLINE Take 1 tablet (25 mg total) by mouth every 8 (eight) hours.   HYDROcodone-acetaminophen 5-325 MG tablet Commonly known as: NORCO/VICODIN Take 1 tablet by mouth every 4 (four) hours as needed for moderate pain or severe pain.   insulin lispro 100 UNIT/ML injection Commonly known as: HUMALOG Inject 2-10 Units into the skin 4 (four) times daily. Per sliding scale: CBG 151-200 2 units, 201-250 4 units, 251-300 6 units, 301-350 8 units, 351-400 10 units,   levETIRAcetam 1000 MG tablet Commonly known as: KEPPRA Take 1,000 mg by mouth every morning.   loperamide 2 MG capsule Commonly known as: IMODIUM Take 4 mg by mouth daily as needed for diarrhea or loose stools.   losartan 25 MG tablet Commonly known as: COZAAR Take 1 tablet (25 mg total) by mouth daily.   metoCLOPramide 5 MG tablet Commonly known as: REGLAN Take 1 tablet (5 mg total) by mouth every 6 (six) hours as needed for nausea, vomiting or refractory nausea / vomiting.   NIFEdipine 60 MG 24 hr tablet Commonly known as: ADALAT CC Take 1 tablet (60 mg total) by mouth every morning.   pantoprazole 40 MG tablet Commonly known as: Protonix Take 1 tablet (40 mg total) by mouth daily.   polyethylene glycol 17 g packet Commonly known as: MiraLax Take 17 g by mouth daily.   rOPINIRole 1 MG tablet Commonly known as: REQUIP Take 1 mg by mouth 3 (three) times daily.   Vitamin D (Ergocalciferol) 1.25 MG (50000  UNIT) Caps capsule Commonly known as: DRISDOL Take 50,000 Units by mouth every Friday.       Follow-up Information    [name], NP. Schedule an appointment as soon as possible for a visit in 1 week(s).   Specialty: Internal Medicine Contact information: [address] [place] [address] [phone]              Allergies  Allergen Reactions  . Aspirin Palpitations    Listed on [facility] 11/12/18    Consultations:  Nephrology   Procedures/Studies: CT Head Wo Contrast  Result Date: 06/07/2020 CLINICAL DATA:  Fall with head trauma EXAM: CT HEAD WITHOUT CONTRAST TECHNIQUE: Contiguous axial images were obtained from the base of the skull through the vertex without intravenous contrast. COMPARISON:  Head CT December 12, 2018 FINDINGS: Brain: No evidence of acute large vascular territory infarction, hemorrhage, hydrocephalus, extra-axial collection or mass lesion/mass effect. Moderate age related cortical atrophy. Similar mild burden of chronic ischemic white matter disease. Vascular: No hyperdense vessel. Atherosclerotic calcifications of the internal carotid arteries at the skull base. Skull: Hyperostosis frontalis. Negative for fracture or focal lesion. Sinuses/Orbits: The  paranasal sinuses and mastoid air cells are predominantly clear. Other: None IMPRESSION: 1. No acute intracranial findings. 2. Similar moderate age related cortical atrophy and mild chronic ischemic white matter disease. Electronically Signed   By: Dahlia Bailiff MD   On: 06/07/2020 17:55   CT PELVIS WO CONTRAST  Result Date: 06/07/2020 CLINICAL DATA:  Pelvic pain after fall EXAM: CT PELVIS WITHOUT CONTRAST TECHNIQUE: Multidetector CT imaging of the pelvis was performed following the standard protocol without intravenous contrast. COMPARISON:  05/03/2020, 03/21/2020 FINDINGS: Urinary Tract: Urinary bladder obscured by metallic streak artifact from patient's hip prosthesis. Bowel: Large rectal stool ball. No  bowel obstruction or evidence of acute bowel inflammation. Vascular/Lymphatic: Extensive vascular calcifications. No pelvic or inguinal lymphadenopathy. Reproductive:  No acute abnormality. Other: Moderate volume ascites seen within the pelvis. Diffuse anasarca. Musculoskeletal: Subacute transversely oriented nondisplaced fracture through the sacrum at the S4 level (series 6, images 10 9-112), new from prior CT. SI joints and pubic symphysis intact without diastasis. Left hip is intact without fracture or dislocation. Partially visualized right hip arthroplasty hardware without apparent complication. IMPRESSION: 1. Subacute appearing transversely oriented nondisplaced fracture through the sacrum at the S4 level, new from prior CT. 2. Moderate volume ascites within the pelvis. Diffuse anasarca. 3. Large rectal stool ball. Electronically Signed   By: Davina Poke D.O.   On: 06/07/2020 18:13   DG Chest Portable 1 View  Result Date: 06/08/2020 CLINICAL DATA:  Fever, wheezing, shortness of breath EXAM: PORTABLE CHEST 1 VIEW COMPARISON:  11/15/2019 FINDINGS: Mild cardiac enlargement. Shallow inspiration. Patchy perihilar infiltrates with peribronchial thickening. Possibly due to airways disease or multifocal pneumonia. No pleural effusions. No pneumothorax. Mediastinal contours appear intact. Postoperative changes in the right shoulder. IMPRESSION: Patchy perihilar infiltrates with peribronchial thickening suggesting airways disease or multifocal pneumonia. Electronically Signed   By: Lucienne Capers M.D.   On: 06/08/2020 01:38     Discharge Exam: Vitals:   06/09/20 2110 06/10/20 0546  BP: (!) 125/23 125/60  Pulse: 67 76  Resp: 20 18  Temp: 97.7 F (36.5 C) 98.7 F (37.1 C)  SpO2: 100% 98%   Vitals:   06/09/20 1407 06/09/20 2110 06/10/20 0546 06/10/20 0606  BP: (!) 166/75 (!) 125/23 125/60   Pulse: 78 67 76   Resp:  20 18   Temp:  97.7 F (36.5 C) 98.7 F (37.1 C)   TempSrc:   Oral   SpO2:  100% 100% 98%   Weight:    47.5 kg  Height:        General: Pt is alert, awake, not in acute distress Cardiovascular: RRR, S1/S2 +, no rubs, no gallops Respiratory: CTA bilaterally, no wheezing, no rhonchi Abdominal: Soft, NT, ND, bowel sounds + Extremities: no edema, no cyanosis    The results of significant diagnostics from this hospitalization (including imaging, microbiology, ancillary and laboratory) are listed below for reference.     Microbiology: Recent Results (from the past 240 hour(s))  Blood culture (routine x 2)     Status: None (Preliminary result)   Collection Time: 06/08/20 12:45 AM   Specimen: Right Antecubital; Blood  Result Value Ref Range Status   Specimen Description RIGHT ANTECUBITAL  Final   Special Requests   Final    BOTTLES DRAWN AEROBIC AND ANAEROBIC Blood Culture adequate volume   Culture   Final    NO GROWTH 1 DAY Performed at [facility], [address], [place], [place] [address]    Report Status PENDING  Incomplete  Blood culture (routine x 2)     Status: None (Preliminary result)   Collection Time: 06/08/20 12:57 AM   Specimen: BLOOD RIGHT HAND  Result Value Ref Range Status   Specimen Description BLOOD RIGHT HAND  Final   Special Requests   Final    BOTTLES DRAWN AEROBIC AND ANAEROBIC Blood Culture adequate volume   Culture   Final    NO GROWTH 1 DAY Performed at [facility], [address], [place], [place] [address]    Report Status PENDING  Incomplete  Resp Panel by RT-PCR (Flu A&B, Covid) Nasopharyngeal Swab     Status: None   Collection Time: 06/08/20  3:13 AM   Specimen: Nasopharyngeal Swab; Nasopharyngeal(NP) swabs in vial transport medium  Result Value Ref Range Status   SARS Coronavirus 2 by RT PCR NEGATIVE NEGATIVE Final    Comment: (NOTE) SARS-CoV-2 target nucleic acids are NOT DETECTED.  The SARS-CoV-2 RNA is generally detectable in upper respiratory specimens during the acute phase of infection. The  lowest concentration of SARS-CoV-2 viral copies this assay can detect is 138 copies/mL. A negative result does not preclude SARS-Cov-2 infection and should not be used as the sole basis for treatment or other patient management decisions. A negative result may occur with  improper specimen collection/handling, submission of specimen other than nasopharyngeal swab, presence of viral mutation(s) within the areas targeted by this assay, and inadequate number of viral copies(<138 copies/mL). A negative result must be combined with clinical observations, patient history, and epidemiological information. The expected result is Negative.  Fact Sheet for Patients:  EntrepreneurPulse.com.au  Fact Sheet for Healthcare Providers:  IncredibleEmployment.be  This test is no t yet approved or cleared by the [place] FDA and  has been authorized for detection and/or diagnosis of SARS-CoV-2 by FDA under an Emergency Use Authorization (EUA). This EUA will remain  in effect (meaning this test can be used) for the duration of the COVID-19 declaration under Section 564(b)(1) of the Act, 21 U.S.C.section 360bbb-3(b)(1), unless the authorization is terminated  or revoked sooner.       Influenza A by PCR NEGATIVE NEGATIVE Final   Influenza B by PCR NEGATIVE NEGATIVE Final    Comment: (NOTE) The Xpert Xpress SARS-CoV-2/FLU/RSV plus assay is intended as an aid in the diagnosis of influenza from Nasopharyngeal swab specimens and should not be used as a sole basis for treatment. Nasal washings and aspirates are unacceptable for Xpert Xpress SARS-CoV-2/FLU/RSV testing.  Fact Sheet for Patients: EntrepreneurPulse.com.au  Fact Sheet for Healthcare Providers: IncredibleEmployment.be  This test is not yet approved or cleared by the [place] FDA and has been authorized for detection and/or diagnosis of SARS-CoV-2 by FDA under  an Emergency Use Authorization (EUA). This EUA will remain in effect (meaning this test can be used) for the duration of the COVID-19 declaration under Section 564(b)(1) of the Act, 21 U.S.C. section 360bbb-3(b)(1), unless the authorization is terminated or revoked.  Performed at [facility], [address] [place],  [address]   MRSA PCR Screening     Status: None   Collection Time: 06/08/20  5:55 AM   Specimen: Nasal Mucosa; Nasopharyngeal  Result Value Ref Range Status   MRSA by PCR NEGATIVE NEGATIVE Final    Comment:        The GeneXpert MRSA Assay (FDA approved for NASAL specimens only), is one component of a comprehensive MRSA colonization surveillance program. It is not intended to diagnose MRSA infection nor to guide or monitor treatment  for MRSA infections. Performed at [facility], [address] [place], [place] [address]      Labs: BNP (last 3 results) No results for input(s): BNP in the last 8760 hours. Basic Metabolic Panel: Recent Labs  Lab 06/07/20 1744 06/08/20 0630 06/09/20 0502 06/10/20 0621  NA 130* 134* 130* 130*  K 4.9 4.5 4.1 4.7  CL 95* 98 93* 93*  CO2 '22 22 23 '$ 21*  GLUCOSE 554* 85 100* 351*  BUN 68* 76* 37* 48*  CREATININE 5.63* 5.89* 4.15* 4.99*  CALCIUM 8.9 9.1 8.6* 8.8*  MG  --  2.5* 2.1 2.5*  PHOS  --  5.7*  --   --    Liver Function Tests: Recent Labs  Lab 06/08/20 0630  AST 299*  ALT 229*  ALKPHOS 221*  BILITOT 0.7  PROT 6.7  ALBUMIN 2.9*   No results for input(s): LIPASE, AMYLASE in the last 168 hours. No results for input(s): AMMONIA in the last 168 hours. CBC: Recent Labs  Lab 06/07/20 1744 06/08/20 0630 06/09/20 0502  WBC 5.4 7.1 5.5  NEUTROABS 3.9  --   --   HGB 12.1 12.2 13.1  HCT 38.3 38.5 40.3  MCV 109.4* 106.9* 106.1*  PLT 115* 112* 108*   Cardiac Enzymes: No results for input(s): CKTOTAL, CKMB, CKMBINDEX, TROPONINI in the last 168 hours. BNP: Invalid input(s): POCBNP CBG: Recent Labs  Lab  06/09/20 1653 06/09/20 1728 06/09/20 1837 06/10/20 0801 06/10/20 1120  GLUCAP 33* 81 128* 324* 344*   D-Dimer No results for input(s): DDIMER in the last 72 hours. Hgb A1c No results for input(s): HGBA1C in the last 72 hours. Lipid Profile No results for input(s): CHOL, HDL, LDLCALC, TRIG, CHOLHDL, LDLDIRECT in the last 72 hours. Thyroid function studies No results for input(s): TSH, T4TOTAL, T3FREE, THYROIDAB in the last 72 hours.  Invalid input(s): FREET3 Anemia work up Recent Labs    06/08/20 0754  VITAMINB12 1,134*  FOLATE 60.5   Urinalysis    Component Value Date/Time   COLORURINE YELLOW 09/27/2019 1310   APPEARANCEUR TURBID (A) 09/27/2019 1310   LABSPEC 1.013 09/27/2019 1310   PHURINE 7.0 09/27/2019 1310   GLUCOSEU 50 (A) 09/27/2019 1310   HGBUR SMALL (A) 09/27/2019 1310   BILIRUBINUR NEGATIVE 09/27/2019 1310   KETONESUR NEGATIVE 09/27/2019 1310   PROTEINUR >=300 (A) 09/27/2019 1310   UROBILINOGEN 0.2 07/25/2014 2309   NITRITE NEGATIVE 09/27/2019 1310   LEUKOCYTESUR MODERATE (A) 09/27/2019 1310   Sepsis Labs Invalid input(s): PROCALCITONIN,  WBC,  LACTICIDVEN Microbiology Recent Results (from the past 240 hour(s))  Blood culture (routine x 2)     Status: None (Preliminary result)   Collection Time: 06/08/20 12:45 AM   Specimen: Right Antecubital; Blood  Result Value Ref Range Status   Specimen Description RIGHT ANTECUBITAL  Final   Special Requests   Final    BOTTLES DRAWN AEROBIC AND ANAEROBIC Blood Culture adequate volume   Culture   Final    NO GROWTH 1 DAY Performed at [facility], [address], [place], [place] [address]    Report Status PENDING  Incomplete  Blood culture (routine x 2)     Status: None (Preliminary result)   Collection Time: 06/08/20 12:57 AM   Specimen: BLOOD RIGHT HAND  Result Value Ref Range Status   Specimen Description BLOOD RIGHT HAND  Final   Special Requests   Final    BOTTLES DRAWN AEROBIC AND ANAEROBIC Blood  Culture adequate volume   Culture  Final    NO GROWTH 1 DAY Performed at [facility], [address] [place], [place] [address]    Report Status PENDING  Incomplete  Resp Panel by RT-PCR (Flu A&B, Covid) Nasopharyngeal Swab     Status: None   Collection Time: 06/08/20  3:13 AM   Specimen: Nasopharyngeal Swab; Nasopharyngeal(NP) swabs in vial transport medium  Result Value Ref Range Status   SARS Coronavirus 2 by RT PCR NEGATIVE NEGATIVE Final    Comment: (NOTE) SARS-CoV-2 target nucleic acids are NOT DETECTED.  The SARS-CoV-2 RNA is generally detectable in upper respiratory specimens during the acute phase of infection. The lowest concentration of SARS-CoV-2 viral copies this assay can detect is 138 copies/mL. A negative result does not preclude SARS-Cov-2 infection and should not be used as the sole basis for treatment or other patient management decisions. A negative result may occur with  improper specimen collection/handling, submission of specimen other than nasopharyngeal swab, presence of viral mutation(s) within the areas targeted by this assay, and inadequate number of viral copies(<138 copies/mL). A negative result must be combined with clinical observations, patient history, and epidemiological information. The expected result is Negative.  Fact Sheet for Patients:  EntrepreneurPulse.com.au  Fact Sheet for Healthcare Providers:  IncredibleEmployment.be  This test is no t yet approved or cleared by the [place] FDA and  has been authorized for detection and/or diagnosis of SARS-CoV-2 by FDA under an Emergency Use Authorization (EUA). This EUA will remain  in effect (meaning this test can be used) for the duration of the COVID-19 declaration under Section 564(b)(1) of the Act, 21 U.S.C.section 360bbb-3(b)(1), unless the authorization is terminated  or revoked sooner.       Influenza A by PCR NEGATIVE NEGATIVE Final    Influenza B by PCR NEGATIVE NEGATIVE Final    Comment: (NOTE) The Xpert Xpress SARS-CoV-2/FLU/RSV plus assay is intended as an aid in the diagnosis of influenza from Nasopharyngeal swab specimens and should not be used as a sole basis for treatment. Nasal washings and aspirates are unacceptable for Xpert Xpress SARS-CoV-2/FLU/RSV testing.  Fact Sheet for Patients: EntrepreneurPulse.com.au  Fact Sheet for Healthcare Providers: IncredibleEmployment.be  This test is not yet approved or cleared by the [place] FDA and has been authorized for detection and/or diagnosis of SARS-CoV-2 by FDA under an Emergency Use Authorization (EUA). This EUA will remain in effect (meaning this test can be used) for the duration of the COVID-19 declaration under Section 564(b)(1) of the Act, 21 U.S.C. section 360bbb-3(b)(1), unless the authorization is terminated or revoked.  Performed at [facility], [address], [place], [place] [address]   MRSA PCR Screening     Status: None   Collection Time: 06/08/20  5:55 AM   Specimen: Nasal Mucosa; Nasopharyngeal  Result Value Ref Range Status   MRSA by PCR NEGATIVE NEGATIVE Final    Comment:        The GeneXpert MRSA Assay (FDA approved for NASAL specimens only), is one component of a comprehensive MRSA colonization surveillance program. It is not intended to diagnose MRSA infection nor to guide or monitor treatment for MRSA infections. Performed at [facility], [address] [place], [place] [address]      Time coordinating discharge: 35 minutes  SIGNED:   Rodena Goldmann, DO [facility] 06/10/2020, 12:41 PM  If 7PM-7AM, please contact night-coverage [email or web address]

## 2020-06-11 LAB — GLUCOSE, CAPILLARY
Glucose-Capillary: 172 mg/dL — ABNORMAL HIGH (ref 70–99)
Glucose-Capillary: 118 mg/dL — ABNORMAL HIGH (ref 70–99)
Glucose-Capillary: 149 mg/dL — ABNORMAL HIGH (ref 70–99)
Glucose-Capillary: 259 mg/dL — ABNORMAL HIGH (ref 70–99)

## 2020-06-11 LAB — RESP PANEL BY RT-PCR (FLU A&B, COVID) ARPGX2
Influenza A by PCR: NEGATIVE
Influenza B by PCR: NEGATIVE
SARS Coronavirus 2 by RT PCR: NEGATIVE

## 2020-06-11 NOTE — TOC Transition Note (Signed)
Transition of Care [facility]) - CM/SW Discharge Note   Patient Details  Name: Erica [name] MRN: [id number] Date of Birth: [birth year]  [facility]) CM/SW Contact:  Shade Flood, LCSW Phone Number: 06/11/2020, 2:19 PM   Clinical Narrative:     Pt stable for dc today per MD. Reviewed bed offer with pt and son and they agree to bed at [facility]. Pt can admit there today. DC clinical sent electronically. RN to call report. Pt will transport with EMS.  There are no other TOC needs for dc.  Final next level of care: Skilled Nursing Facility Barriers to Discharge: Barriers Resolved   Patient Goals and CMS Choice Patient states their goals for this hospitalization and ongoing recovery are:: Go to SNF if recommeneded CMS Medicare.gov Compare Post Acute Care list provided to:: Patient Choice offered to / list presented to : Patient  Discharge Placement              Patient chooses bed at: [facility] Patient to be transferred to facility by: EMS Name of family member notified: [name] Patient and family notified of of transfer: 06/11/20  Discharge Plan and Services In-house Referral: Clinical Social Work Discharge Planning Services: CM Consult Post Acute Care Choice: [place]                               Social Determinants of Health (SDOH) Interventions     Readmission Risk Interventions Readmission Risk Prevention Plan 06/11/2020 11/19/2019 11/15/2019  Transportation Screening Complete Complete -  Medication Review Press photographer) Complete Complete -  PCP or Specialist appointment within 3-5 days of discharge - Complete Complete  HRI or Home Care Consult Complete Complete -  SW Recovery Care/Counseling Consult Complete Complete -  Palliative Care Screening Not Applicable Complete -  Skilled Nursing Facility Complete Complete -  Some recent data might be hidden

## 2020-06-11 NOTE — Progress Notes (Signed)
Patient ID: Erica [name], female   DOB: 1955-03-30, 65 y.o.   MRN: [id number]  [facility] Progress Note    Subjective:   Was open for d/c but had a fall last night when using the bedside commode. She felt like legs gave out. No other events, patient not offering complaints at this time.   Objective:   BP (!) 145/62 (BP Location: Right Arm)   Pulse 84   Temp 97.6 F (36.4 C) (Oral)   Resp 16   Ht '4\' 11"'$  (1.499 m)   Wt 47.6 kg   SpO2 97%   BMI 21.20 kg/m   Intake/Output: I/O last 3 completed shifts: In: [id number] [P.O.:1070; IV Piggyback:100] Out: -    Intake/Output this shift:  No intake/output data recorded. Weight change: 0.1 kg  Physical Exam: Gen: NAD CVS: RRR Resp: CTA  Abd: +BS, soft, NT/ND Ext: no edema, LUE AVF +T/B  Labs: BMET Recent Labs  Lab 06/07/20 1744 06/08/20 0630 06/09/20 0502 06/10/20 0621  NA 130* 134* 130* 130*  K 4.9 4.5 4.1 4.7  CL 95* 98 93* 93*  CO2 '22 22 23 '$ 21*  GLUCOSE 554* 85 100* 351*  BUN 68* 76* 37* 48*  CREATININE 5.63* 5.89* 4.15* 4.99*  ALBUMIN  --  2.9*  --   --   CALCIUM 8.9 9.1 8.6* 8.8*  PHOS  --  5.7*  --   --    CBC Recent Labs  Lab 06/07/20 1744 06/08/20 0630 06/09/20 0502  WBC 5.4 7.1 5.5  NEUTROABS 3.9  --   --   HGB 12.1 12.2 13.1  HCT 38.3 38.5 40.3  MCV 109.4* 106.9* 106.1*  PLT 115* 112* 108*      Medications:    . azithromycin  250 mg Oral Daily  . calcitRIOL  0.25 mcg Oral Q T,Th,Sa-HD  . carvedilol  6.25 mg Oral BID  . cefdinir  300 mg Oral Daily  . Chlorhexidine Gluconate Cloth  6 each Topical Q0600  . Chlorhexidine Gluconate Cloth  6 each Topical Q0600  . ferric citrate  420 mg Oral TID WC  . folic acid  1 mg Oral q morning  . heparin  5,000 Units Subcutaneous Q8H  . hydrALAZINE  25 mg Oral Q8H  . insulin aspart  0-6 Units Subcutaneous TID WC  . levETIRAcetam  1,000 mg Oral q morning  . losartan  25 mg Oral Daily  . NIFEdipine  60 mg Oral q morning  . polyethylene  glycol  17 g Oral Daily  . polyvinyl alcohol  1 drop Both Eyes BID  . rOPINIRole  1 mg Oral TID   OUTPATIENT DIALYSIS:  [facility] on TTS EDW 43.5kg, Time: 3.75 hours, 2K/ 2.5 Ca, BFR 400, DFR 600, Dialyzer: [place] Revacir 200, LUE AVF Heparin 1000 unit load, 500 units/hr (stop 60 min before end of HD)  Assessment/ Plan:   1. Sacral fracture, nondisplaced s/p mechanical fall - PT recommending SNF placement.    2. ESRD - continue with TTS schedule 3. 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 Nutrition:renal diet 6. Hypertension: improved with HD and UF. 7. Disposition :  Pending SNF.  Ok for outpatient HD as she was able to sit in a chair without issue  Gean Quint, MD [place] 06/11/2020, 8:24 AM

## 2020-06-11 NOTE — Progress Notes (Signed)
Called facility to see if they would receive patient tonight.  Facility stated they do not take new admissions after 9pm.  Patient will have stay tonight until transportation can be arranged tomorrow.

## 2020-06-11 NOTE — Progress Notes (Signed)
Patient seen and evaluated this morning and appears to be in stable condition for discharge after hemodialysis scheduled for today.  She did have a fall last night, but does not appear to have sustained any injuries from this.  Please refer to discharge summary dictated 06/10/2020 for full details regarding clinical course.  Total care time: 20 minutes.

## 2020-06-12 NOTE — Progress Notes (Signed)
Pt discharged via stretcher by [facility] for transport to [facility]. Discharge packet received by EMS personnel.

## 2020-06-12 NOTE — Progress Notes (Signed)
Patient was to be discharged to SNF yesterday, but ended up staying in the hospital overnight. Patient had discharged this morning prior to me physically seeing the patient. Review of records indicate that vitals were stable overnight. Blood sugars noted to be stable over last 24 hours. No significant events from overnight were documented by nursing staff.   Please refer to DC summary done by Dr. Manuella Ghazi on 06/10/20 regarding further details on hospital course and DC meds. It does not appear that patient needed any changes to her discharge meds or plan of care.  [employer]

## 2020-06-13 LAB — CULTURE, BLOOD (ROUTINE X 2)
Culture: NO GROWTH
Culture: NO GROWTH
Special Requests: ADEQUATE
Special Requests: ADEQUATE

## 2020-06-15 ENCOUNTER — Other Ambulatory Visit: Payer: Self-pay

## 2020-06-15 ENCOUNTER — Emergency Department (HOSPITAL_COMMUNITY)
Admission: EM | Admit: 2020-06-15 | Discharge: 2020-06-15 | Disposition: A | Discharge status: Home / Self Care | Payer: Medicare Other | Attending: Emergency Medicine | Admitting: Emergency Medicine

## 2020-06-15 ENCOUNTER — Encounter (HOSPITAL_COMMUNITY): Payer: Self-pay

## 2020-06-15 DIAGNOSIS — R112 Nausea with vomiting, unspecified: Secondary | ICD-10-CM | POA: Diagnosis not present

## 2020-06-15 DIAGNOSIS — N186 End stage renal disease: Secondary | ICD-10-CM | POA: Diagnosis not present

## 2020-06-15 DIAGNOSIS — R140 Abdominal distension (gaseous): Secondary | ICD-10-CM | POA: Insufficient documentation

## 2020-06-15 DIAGNOSIS — E1122 Type 2 diabetes mellitus with diabetic chronic kidney disease: Secondary | ICD-10-CM | POA: Insufficient documentation

## 2020-06-15 DIAGNOSIS — R109 Unspecified abdominal pain: Secondary | ICD-10-CM | POA: Diagnosis present

## 2020-06-15 DIAGNOSIS — Z992 Dependence on renal dialysis: Secondary | ICD-10-CM | POA: Insufficient documentation

## 2020-06-15 DIAGNOSIS — R1084 Generalized abdominal pain: Secondary | ICD-10-CM | POA: Diagnosis not present

## 2020-06-15 DIAGNOSIS — Z794 Long term (current) use of insulin: Secondary | ICD-10-CM | POA: Diagnosis not present

## 2020-06-15 DIAGNOSIS — I5032 Chronic diastolic (congestive) heart failure: Secondary | ICD-10-CM | POA: Diagnosis not present

## 2020-06-15 DIAGNOSIS — I132 Hypertensive heart and chronic kidney disease with heart failure and with stage 5 chronic kidney disease, or end stage renal disease: Secondary | ICD-10-CM | POA: Insufficient documentation

## 2020-06-15 DIAGNOSIS — Z79899 Other long term (current) drug therapy: Secondary | ICD-10-CM | POA: Insufficient documentation

## 2020-06-15 DIAGNOSIS — E1140 Type 2 diabetes mellitus with diabetic neuropathy, unspecified: Secondary | ICD-10-CM | POA: Insufficient documentation

## 2020-06-15 LAB — CBC
HCT: 41.6 % (ref 36.0–46.0)
Hemoglobin: 13 g/dL (ref 12.0–15.0)
MCH: 33.8 pg (ref 26.0–34.0)
MCHC: 31.3 g/dL (ref 30.0–36.0)
MCV: 108.1 fL — ABNORMAL HIGH (ref 80.0–100.0)
Platelets: 152 10*3/uL (ref 150–400)
RBC: 3.85 MIL/uL — ABNORMAL LOW (ref 3.87–5.11)
RDW: 12.1 % (ref 11.5–15.5)
WBC: 6 10*3/uL (ref 4.0–10.5)
nRBC: 0 % (ref 0.0–0.2)

## 2020-06-15 LAB — COMPREHENSIVE METABOLIC PANEL
ALT: 114 U/L — ABNORMAL HIGH (ref 0–44)
AST: 60 U/L — ABNORMAL HIGH (ref 15–41)
Albumin: 3.3 g/dL — ABNORMAL LOW (ref 3.5–5.0)
Alkaline Phosphatase: 243 U/L — ABNORMAL HIGH (ref 38–126)
Anion gap: 12 (ref 5–15)
BUN: 31 mg/dL — ABNORMAL HIGH (ref 8–23)
CO2: 25 mmol/L (ref 22–32)
Calcium: 8.9 mg/dL (ref 8.9–10.3)
Chloride: 93 mmol/L — ABNORMAL LOW (ref 98–111)
Creatinine, Ser: 4.2 mg/dL — ABNORMAL HIGH (ref 0.44–1.00)
GFR, Estimated: 11 mL/min — ABNORMAL LOW (ref >60)
Glucose, Bld: 107 mg/dL — ABNORMAL HIGH (ref 70–99)
Potassium: 5.4 mmol/L — ABNORMAL HIGH (ref 3.5–5.1)
Sodium: 130 mmol/L — ABNORMAL LOW (ref 135–145)
Total Bilirubin: 0.7 mg/dL (ref 0.3–1.2)
Total Protein: 7 g/dL (ref 6.5–8.1)

## 2020-06-15 LAB — CBG MONITORING, ED: Glucose-Capillary: 111 mg/dL — ABNORMAL HIGH (ref 70–99)

## 2020-06-15 LAB — LIPASE, BLOOD: Lipase: 16 U/L (ref 11–51)

## 2020-06-15 MED ORDER — OXYCODONE-ACETAMINOPHEN 5-325 MG PO TABS
1.0000 | ORAL_TABLET | Freq: Once | ORAL | Status: AC
Start: 2020-06-15 — End: 2020-06-15
  Administered 2020-06-15: 1 via ORAL
  Filled 2020-06-15: qty 1

## 2020-06-15 MED ORDER — ONDANSETRON HCL 4 MG/2ML IJ SOLN: 4.0000 mg | Freq: Once | INTRAMUSCULAR | Status: DC

## 2020-06-15 MED ORDER — METOCLOPRAMIDE HCL 5 MG/ML IJ SOLN
10.0000 mg | Freq: Once | INTRAMUSCULAR | Status: AC
  Administered 2020-06-15: 10 mg via INTRAMUSCULAR

## 2020-06-15 MED ORDER — ONDANSETRON 8 MG PO TBDP
8.0000 mg | ORAL_TABLET | Freq: Once | ORAL | Status: AC
  Administered 2020-06-15: 8 mg via ORAL
  Filled 2020-06-15: qty 1

## 2020-06-15 MED ORDER — METOCLOPRAMIDE HCL 5 MG/ML IJ SOLN: 10.0000 mg | Freq: Once | INTRAMUSCULAR | Status: DC

## 2020-06-15 MED ORDER — SODIUM ZIRCONIUM CYCLOSILICATE 5 G PO PACK
10.0000 g | PACK | ORAL | Status: AC
  Administered 2020-06-15: 10 g via ORAL
  Filled 2020-06-15: qty 2

## 2020-06-15 NOTE — ED Provider Notes (Addendum)
[facility] Provider Note   CSN: [id number] Arrival date & time: 06/15/20  0540     History Chief Complaint  Patient presents with  . Abdominal Pain    Erica [name] is a 65 y.o. female.  Patient presents to the emergency department for evaluation of abdominal pain with nausea and vomiting.  Symptoms reportedly began overnight.  She comes to the ER by ambulance.  She presents from skilled nursing facility which she recently was admitted to.        Past Medical History:  Diagnosis Date  . Alcohol-induced pancreatitis   . Chronic diarrhea   . Depression   . Diabetes mellitus    fasting blood sugar 110-120s  . Dialysis patient ([place])   . Diastolic CHF ([place])   . DKA (diabetic ketoacidoses)   . Gastroparesis   . GERD (gastroesophageal reflux disease)   . Heart murmur   . History of kidney stones   . Hyperlipidemia   . Hypertension   . Hypokalemia   . Muscle spasm   . Neuropathic pain   . Neuropathy    Hx: of  . Pyelonephritis   . Seizures ([place])   . Vitamin B12 deficiency   . Vitamin D deficiency     Patient Active Problem List   Diagnosis Date Noted  . Pressure injury of skin 06/09/2020  . Sacral fracture ([place]) 06/08/2020  . Thrombocytopenia ([place]) 06/08/2020  . Hypertensive urgency 06/08/2020  . (HFpEF) heart failure with preserved ejection fraction ([place]) 06/08/2020  . Fall 06/08/2020  . Elevated MCV 06/08/2020  . Palliative care by specialist   . Generalized weakness 11/14/2019  . HCAP (healthcare-associated pneumonia) 11/14/2019  . Hyperkalemia 11/13/2019  . Hyponatremia 09/27/2019  . Closed fracture of right proximal humerus 04/20/2019  . Surgery, elective   . Seizure ([place]) 11/12/2018  . History of hydronephrosis --stents in place 12/08/2017  . Stroke ([place]) 11/29/2017  . Seizures ([place])   . Hydronephrosis   . Recurrent UTI   . Diabetes mellitus type 2 in nonobese (HCC)   . ESRD (end stage renal disease) ([place])   . Anemia  of chronic disease   . Chronic diastolic congestive heart failure ([place])   . Essential hypertension   . PICC (peripherally inserted central catheter) in place   . Acute pyelonephritis 11/12/2017  . Uncontrolled type 2 diabetes mellitus with hyperglycemia, with long-term current use of insulin ([place]) 11/10/2017  . Renal failure 11/08/2017  . Seizure disorder ([place]) 11/08/2017  . Orthostatic hypotension 07/25/2014  . Moderate protein malnutrition ([place]) 09/15/2013  . Aspiration pneumonia ([place]) 09/15/2013  . Acute respiratory failure requiring reintubation ([place]) 09/11/2013  . probable Seizures due to metabolic disorder [id number]  . Lactic acidosis 03/19/2013  . Abdominal pain 03/19/2013  . Rotavirus infection 10/29/2012  . Type II or unspecified type diabetes mellitus without mention of complication, uncontrolled 10/29/2012  . Protein-calorie malnutrition, severe ([place]) 10/27/2012  . NSTEMI (non-ST elevated myocardial infarction) ([place]) 10/26/2012  . Fever, unspecified 10/26/2012  . Hypotension 10/25/2012  . Metabolic acidosis [id number]  . Chronic diarrhea 10/25/2012  . Tobacco abuse 10/25/2012  . DKA (diabetic ketoacidoses) 09/09/2012  . Dehydration 09/09/2012  . DKA, type 2 ([place]) 05/20/2012  . Abnormal LFTs 05/20/2012  . Heart murmur, systolic [id number]  . Hypoglycemia 07/08/2011  . Metabolic encephalopathy [id number]  . Alcohol abuse 07/08/2011  . Hypokalemia 07/08/2011  . Nausea & vomiting 07/08/2011  . H/O chronic pancreatitis 07/08/2011    Past Surgical History:  Procedure Laterality  Date  . AV FISTULA PLACEMENT Left 12/15/2017   Procedure: INSERTION OF ARTERIOVENOUS (AV) GORE-TEX GRAFT ARM;  Surgeon: Rosetta Posner, MD;  Location: [facility];  Service: Vascular;  Laterality: Left;  . CATARACT EXTRACTION W/PHACO Right 03/14/2015   Procedure: CATARACT EXTRACTION PHACO AND INTRAOCULAR LENS PLACEMENT (IOC);  Surgeon: Baruch Goldmann, MD;  Location: [facility];  Service: Ophthalmology;   Laterality: Right;  CDE:11.13  . CATARACT EXTRACTION W/PHACO Left 04/11/2015   Procedure: CATARACT EXTRACTION PHACO AND INTRAOCULAR LENS PLACEMENT LEFT EYE CDE=9.68;  Surgeon: Baruch Goldmann, MD;  Location: [facility];  Service: Ophthalmology;  Laterality: Left;  . COLONOSCOPY  02/24/2010  . cystoscopy with ureteral stent  Bilateral 10/07/2017   At [facility] CV LINE RIGHT  11/17/2017  . IR FLUORO GUIDE CV LINE RIGHT  11/22/2017  . IR REMOVAL TUN CV CATH W/O FL  01/26/2018  . IR US GUIDE VASC ACCESS RIGHT  11/17/2017  . MULTIPLE EXTRACTIONS WITH ALVEOLOPLASTY N/A 06/13/2012   Procedure: MULTIPLE EXTRACION WITH ALVEOLOPLASTY EXTRACT: 18, 19, 20, 21, 22, 24, 25, 27, 28, 29, 30, 31;  Surgeon: Gae Bon, DDS;  Location: [facility];  Service: Oral Surgery;  Laterality: N/A;  . ORIF HUMERUS FRACTURE Right 04/20/2019   Procedure: OPEN REDUCTION INTERNAL FIXATION (ORIF) PROXIMAL HUMERUS FRACTURE;  Surgeon: Marchia Bond, MD;  Location: [facility];  Service: Orthopedics;  Laterality: Right;  . TUBAL LIGATION    . URETERAL STENT PLACEMENT  09/2017     OB History    Gravida      Para      Term      Preterm      AB      Living  2     SAB      IAB      Ectopic      Multiple      Live Births              Family History  Problem Relation Age of Onset  . Diabetes Sister   . Chronic Renal Failure Neg Hx     Social History   Tobacco Use  . Smoking status: Never Smoker  . Smokeless tobacco: Current User    Types: Snuff  Vaping Use  . Vaping Use: Never used  Substance Use Topics  . Alcohol use: Not Currently    Alcohol/week: 1.0 standard drink    Types: 1 Cans of beer per week  . Drug use: No    Home Medications Prior to Admission medications   Medication Sig Start Date End Date Taking? Authorizing Provider  acetaminophen (TYLENOL) 325 MG tablet Take 650 mg by mouth every 4 (four) hours as needed for moderate pain.    [provider]  albuterol  (VENTOLIN HFA) 108 (90 Base) MCG/ACT inhaler Inhale 2 puffs into the lungs every 4 (four) hours as needed for wheezing or shortness of breath.     [provider]  calcitRIOL (ROCALTROL) 0.25 MCG capsule Take 1 capsule (0.25 mcg total) by mouth Every Tuesday,Thursday,and Saturday with dialysis. 12/18/17   Love, Ivan Anchors, PA-C  carboxymethylcellulose (ARTIFICIAL TEARS) 1 % ophthalmic solution Place 1 drop into both eyes daily as needed (dry eyes). Patient not taking: Reported on 06/08/2020    [provider]  carvedilol (COREG) 6.25 MG tablet Take 1 tablet (6.25 mg total) by mouth 2 (two) times daily. 11/15/18   Kinnie Feil, MD  cinacalcet (SENSIPAR) 30 MG tablet Take  30 mg by mouth every evening.    [provider]  Emollient (CETAPHIL) cream Apply 1 application topically every 12 (twelve) hours as needed (dry skin). Patient not taking: Reported on 06/08/2020    [provider]  ferric citrate (AURYXIA) 1 GM 210 MG(Fe) tablet Take 2 tablets (420 mg total) by mouth 3 (three) times daily with meals. 11/15/18   Kinnie Feil, MD  folic acid (FOLVITE) 1 MG tablet Take 1 mg by mouth every morning.    [provider]  hydrALAZINE (APRESOLINE) 25 MG tablet Take 1 tablet (25 mg total) by mouth every 8 (eight) hours. 11/19/19   Oswald Hillock, MD  HYDROcodone-acetaminophen (NORCO/VICODIN) 5-325 MG tablet Take 1 tablet by mouth every 4 (four) hours as needed for moderate pain or severe pain. 06/10/20   Manuella Ghazi, Pratik D, DO  insulin lispro (HUMALOG) 100 UNIT/ML injection Inject 2-10 Units into the skin 4 (four) times daily. Per sliding scale: CBG 151-200 2 units, 201-250 4 units, 251-300 6 units, 301-350 8 units, 351-400 10 units,    [provider]  levETIRAcetam (KEPPRA) 1000 MG tablet Take 1,000 mg by mouth every morning.    [provider]  loperamide (IMODIUM) 2 MG capsule Take 4 mg by mouth daily as needed for diarrhea or loose stools.     [provider]  losartan (COZAAR) 25 MG tablet Take 1 tablet (25 mg total) by mouth daily. 11/20/19   Oswald Hillock, MD  metoCLOPramide (REGLAN) 5 MG tablet Take 1 tablet (5 mg total) by mouth every 6 (six) hours as needed for nausea, vomiting or refractory nausea / vomiting. 11/19/19   Oswald Hillock, MD  NIFEdipine (ADALAT CC) 60 MG 24 hr tablet Take 1 tablet (60 mg total) by mouth every morning. 11/20/19   Oswald Hillock, MD  pantoprazole (PROTONIX) 40 MG tablet Take 1 tablet (40 mg total) by mouth daily. 11/19/19 01/18/20  Oswald Hillock, MD  polyethylene glycol (MIRALAX) 17 g packet Take 17 g by mouth daily. Patient not taking: Reported on 06/08/2020 08/10/19   Suella Broad A, PA-C  rOPINIRole (REQUIP) 1 MG tablet Take 1 mg by mouth 3 (three) times daily.    [provider]  Vitamin D, Ergocalciferol, (DRISDOL) 1.25 MG (50000 UT) CAPS capsule Take 50,000 Units by mouth every Friday.    [provider]    Allergies    Aspirin  Review of Systems   Review of Systems  Gastrointestinal: Positive for abdominal pain, nausea and vomiting.  All other systems reviewed and are negative.   Physical Exam Updated Vital Signs BP (!) 187/82 (BP Location: Right Arm)   Pulse 74   Temp 97.9 F (36.6 C) (Oral)   Resp (!) 21   Ht '4\' 11"'$  (1.499 m)   Wt 47.1 kg   SpO2 100%   BMI 20.97 kg/m   Physical Exam Vitals and nursing note reviewed.  Constitutional:      General: She is not in acute distress.    Appearance: Normal appearance. She is well-developed.  HENT:     Head: Normocephalic and atraumatic.     Right Ear: Hearing normal.     Left Ear: Hearing normal.     Nose: Nose normal.  Eyes:     Conjunctiva/sclera: Conjunctivae normal.     Pupils: Pupils are equal, round, and reactive to light.  Cardiovascular:     Rate and Rhythm: Regular rhythm.     Heart sounds: S1 normal  and S2 normal. No murmur heard. No friction rub. No gallop.   Pulmonary:     Effort:  Pulmonary effort is normal. No respiratory distress.     Breath sounds: Normal breath sounds.  Chest:     Chest wall: No tenderness.  Abdominal:     General: Bowel sounds are normal. There is distension.     Palpations: Abdomen is soft.     Tenderness: There is generalized abdominal tenderness. There is no guarding or rebound. Negative signs include Murphy's sign and McBurney's sign.     Hernia: No hernia is present.  Musculoskeletal:        General: Normal range of motion.     Cervical back: Normal range of motion and neck supple.  Skin:    General: Skin is warm and dry.     Findings: No rash.  Neurological:     Mental Status: She is alert and oriented to person, place, and time.     GCS: GCS eye subscore is 4. GCS verbal subscore is 5. GCS motor subscore is 6.     Cranial Nerves: No cranial nerve deficit.     Sensory: No sensory deficit.     Coordination: Coordination normal.  Psychiatric:        Speech: Speech normal.        Behavior: Behavior normal.        Thought Content: Thought content normal.     ED Results / Procedures / Treatments   Labs (all labs ordered are listed, but only abnormal results are displayed) Labs Reviewed  COMPREHENSIVE METABOLIC PANEL - Abnormal; Notable for the following components:      Result Value   Sodium 130 (*)    Potassium 5.4 (*)    Chloride 93 (*)    Glucose, Bld 107 (*)    BUN 31 (*)    Creatinine, Ser 4.20 (*)    Albumin 3.3 (*)    AST 60 (*)    ALT 114 (*)    Alkaline Phosphatase 243 (*)    GFR, Estimated 11 (*)    All other components within normal limits  CBC - Abnormal; Notable for the following components:   RBC 3.85 (*)    MCV 108.1 (*)    All other components within normal limits  CBG MONITORING, ED - Abnormal; Notable for the following components:   Glucose-Capillary 111 (*)    All other components within normal limits  LIPASE, BLOOD  URINALYSIS, ROUTINE W REFLEX MICROSCOPIC    EKG EKG  Interpretation  Date/Time:  Saturday June 15 2020 06:30:27 EDT Ventricular Rate:  83 PR Interval:  194 QRS Duration: 66 QT Interval:  384 QTC Calculation: 451 R Axis:   -26 Text Interpretation: Normal sinus rhythm Low voltage QRS Septal infarct , age undetermined Abnormal ECG Confirmed by Orpah Greek [phone]) on 06/15/2020 7:09:47 AM   Radiology No results found.  Procedures Procedures   Medications Ordered in ED Medications  metoCLOPramide (REGLAN) injection 10 mg (has no administration in time range)  oxyCODONE-acetaminophen (PERCOCET/ROXICET) 5-325 MG per tablet 1 tablet (has no administration in time range)  ondansetron (ZOFRAN-ODT) disintegrating tablet 8 mg (has no administration in time range)  sodium zirconium cyclosilicate (LOKELMA) packet 10 g (has no administration in time range)    ED Course  I have reviewed the triage vital signs and the nursing notes.  Pertinent labs & imaging results that were available during my care of the patient were reviewed by me and considered  in my medical decision making (see chart for details).    MDM Rules/Calculators/A&P                          Patient presents to the emergency department for evaluation from skilled nursing center.  She presents with complaints of abdominal pain, nausea and vomiting.  Patient with history of chronic abdominal pain, recurrent episodes of nausea and vomiting secondary to gastroparesis.   Patient has a reassuring work-up.  Upon recheck she is sleeping comfortably.  She indicates that her abdominal pain is improved.  She has not had any vomiting here.  Her only abnormality on work-up was mild elevations of LFTs but below her baseline and a slight hyperkalemia.  Patient requires dialysis today, as today is her day for dialysis.  She does not require hospitalization for urgent dialysis.  Final Clinical Impression(s) / ED Diagnoses Final diagnoses:  Generalized abdominal pain    Rx / DC  Orders ED Discharge Orders    None       Mccauley Diehl, Gwenyth Allegra, MD 06/15/20 [id number]    Orpah Greek, MD 06/15/20 [phone]

## 2020-06-15 NOTE — Discharge Instructions (Addendum)
You must arrange for patient to get her dialysis today

## 2020-06-15 NOTE — ED Triage Notes (Signed)
BIB EMS c/o abd pain and nausea since 0400.

## 2020-06-15 NOTE — ED Notes (Signed)
Pt unable to sign MSE waiver d/t weakness.

## 2020-07-17 ENCOUNTER — Emergency Department (HOSPITAL_COMMUNITY): Payer: Medicare Other

## 2020-07-17 ENCOUNTER — Other Ambulatory Visit: Payer: Self-pay

## 2020-07-17 ENCOUNTER — Emergency Department (HOSPITAL_COMMUNITY)
Admission: EM | Admit: 2020-07-17 | Discharge: 2020-07-18 | Disposition: A | Discharge status: Home / Self Care | Payer: Medicare Other | Attending: Emergency Medicine | Admitting: Emergency Medicine

## 2020-07-17 ENCOUNTER — Encounter (HOSPITAL_COMMUNITY): Payer: Self-pay | Admitting: *Deleted

## 2020-07-17 DIAGNOSIS — R109 Unspecified abdominal pain: Secondary | ICD-10-CM | POA: Diagnosis not present

## 2020-07-17 DIAGNOSIS — I5032 Chronic diastolic (congestive) heart failure: Secondary | ICD-10-CM | POA: Diagnosis not present

## 2020-07-17 DIAGNOSIS — I132 Hypertensive heart and chronic kidney disease with heart failure and with stage 5 chronic kidney disease, or end stage renal disease: Secondary | ICD-10-CM | POA: Diagnosis not present

## 2020-07-17 DIAGNOSIS — N186 End stage renal disease: Secondary | ICD-10-CM | POA: Insufficient documentation

## 2020-07-17 DIAGNOSIS — Z20822 Contact with and (suspected) exposure to covid-19: Secondary | ICD-10-CM | POA: Insufficient documentation

## 2020-07-17 DIAGNOSIS — Z992 Dependence on renal dialysis: Secondary | ICD-10-CM | POA: Diagnosis not present

## 2020-07-17 DIAGNOSIS — E1140 Type 2 diabetes mellitus with diabetic neuropathy, unspecified: Secondary | ICD-10-CM | POA: Diagnosis not present

## 2020-07-17 DIAGNOSIS — Z794 Long term (current) use of insulin: Secondary | ICD-10-CM | POA: Insufficient documentation

## 2020-07-17 DIAGNOSIS — Z79899 Other long term (current) drug therapy: Secondary | ICD-10-CM | POA: Insufficient documentation

## 2020-07-17 DIAGNOSIS — F17200 Nicotine dependence, unspecified, uncomplicated: Secondary | ICD-10-CM | POA: Insufficient documentation

## 2020-07-17 DIAGNOSIS — R4182 Altered mental status, unspecified: Secondary | ICD-10-CM | POA: Diagnosis present

## 2020-07-17 DIAGNOSIS — E1122 Type 2 diabetes mellitus with diabetic chronic kidney disease: Secondary | ICD-10-CM | POA: Insufficient documentation

## 2020-07-17 LAB — CBC WITH DIFFERENTIAL/PLATELET
Abs Immature Granulocytes: 0.02 10*3/uL (ref 0.00–0.07)
Basophils Absolute: 0 10*3/uL (ref 0.0–0.1)
Basophils Relative: 0 %
Eosinophils Absolute: 0.2 10*3/uL (ref 0.0–0.5)
Eosinophils Relative: 3 %
HCT: 33.8 % — ABNORMAL LOW (ref 36.0–46.0)
Hemoglobin: 10.5 g/dL — ABNORMAL LOW (ref 12.0–15.0)
Immature Granulocytes: 0 %
Lymphocytes Relative: 14 %
Lymphs Abs: 0.9 10*3/uL (ref 0.7–4.0)
MCH: 34.5 pg — ABNORMAL HIGH (ref 26.0–34.0)
MCHC: 31.1 g/dL (ref 30.0–36.0)
MCV: 111.2 fL — ABNORMAL HIGH (ref 80.0–100.0)
Monocytes Absolute: 0.7 10*3/uL (ref 0.1–1.0)
Monocytes Relative: 11 %
Neutro Abs: 4.9 10*3/uL (ref 1.7–7.7)
Neutrophils Relative %: 72 %
Platelets: 149 10*3/uL — ABNORMAL LOW (ref 150–400)
RBC: 3.04 MIL/uL — ABNORMAL LOW (ref 3.87–5.11)
RDW: 13.3 % (ref 11.5–15.5)
WBC: 6.8 10*3/uL (ref 4.0–10.5)
nRBC: 0 % (ref 0.0–0.2)

## 2020-07-17 LAB — COMPREHENSIVE METABOLIC PANEL
ALT: 94 U/L — ABNORMAL HIGH (ref 0–44)
AST: 60 U/L — ABNORMAL HIGH (ref 15–41)
Albumin: 3.1 g/dL — ABNORMAL LOW (ref 3.5–5.0)
Alkaline Phosphatase: 267 U/L — ABNORMAL HIGH (ref 38–126)
Anion gap: 11 (ref 5–15)
BUN: 75 mg/dL — ABNORMAL HIGH (ref 8–23)
CO2: 19 mmol/L — ABNORMAL LOW (ref 22–32)
Calcium: 8.8 mg/dL — ABNORMAL LOW (ref 8.9–10.3)
Chloride: 100 mmol/L (ref 98–111)
Creatinine, Ser: 6.06 mg/dL — ABNORMAL HIGH (ref 0.44–1.00)
GFR, Estimated: 7 mL/min — ABNORMAL LOW (ref >60)
Glucose, Bld: 121 mg/dL — ABNORMAL HIGH (ref 70–99)
Potassium: 5.2 mmol/L — ABNORMAL HIGH (ref 3.5–5.1)
Sodium: 130 mmol/L — ABNORMAL LOW (ref 135–145)
Total Bilirubin: 0.9 mg/dL (ref 0.3–1.2)
Total Protein: 6.7 g/dL (ref 6.5–8.1)

## 2020-07-17 LAB — AMMONIA: Ammonia: 67 umol/L — ABNORMAL HIGH (ref 9–35)

## 2020-07-17 LAB — RESP PANEL BY RT-PCR (FLU A&B, COVID) ARPGX2
Influenza A by PCR: NEGATIVE
Influenza B by PCR: NEGATIVE
SARS Coronavirus 2 by RT PCR: NEGATIVE

## 2020-07-17 LAB — LIPASE, BLOOD: Lipase: 17 U/L (ref 11–51)

## 2020-07-17 LAB — ETHANOL: Alcohol, Ethyl (B): <10 mg/dL (ref <10)

## 2020-07-17 LAB — TSH: TSH: 1.186 u[IU]/mL (ref 0.350–4.500)

## 2020-07-17 NOTE — ED Notes (Signed)
Two RNs attempted to obtain IV access and were unsuccessful. MD made aware.
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
## 2020-07-17 NOTE — Discharge Instructions (Addendum)
Not require emergent dialysis tonight.  But it is important that you get dialysis done tomorrow.  Or your family gets the transport service to get you there.  Today's work-up without any significant abnormalities.  No evidence of any alcohol in your system.  Your family was worried about that.

## 2020-07-17 NOTE — ED Notes (Signed)
Spoke to pt son, he stated he will arrange a ride home for pt.

## 2020-07-17 NOTE — ED Notes (Signed)
Patient transported to CT 

## 2020-07-24 ENCOUNTER — Emergency Department (HOSPITAL_COMMUNITY)
Admission: EM | Admit: 2020-07-24 | Discharge: 2020-07-24 | Disposition: A | Discharge status: Home / Self Care | Payer: Medicare Other | Attending: Emergency Medicine | Admitting: Emergency Medicine

## 2020-07-24 ENCOUNTER — Other Ambulatory Visit: Payer: Self-pay

## 2020-07-24 DIAGNOSIS — Z79899 Other long term (current) drug therapy: Secondary | ICD-10-CM | POA: Insufficient documentation

## 2020-07-24 DIAGNOSIS — I132 Hypertensive heart and chronic kidney disease with heart failure and with stage 5 chronic kidney disease, or end stage renal disease: Secondary | ICD-10-CM | POA: Diagnosis not present

## 2020-07-24 DIAGNOSIS — N186 End stage renal disease: Secondary | ICD-10-CM | POA: Diagnosis not present

## 2020-07-24 DIAGNOSIS — Z794 Long term (current) use of insulin: Secondary | ICD-10-CM | POA: Diagnosis not present

## 2020-07-24 DIAGNOSIS — E1110 Type 2 diabetes mellitus with ketoacidosis without coma: Secondary | ICD-10-CM | POA: Diagnosis not present

## 2020-07-24 DIAGNOSIS — E1165 Type 2 diabetes mellitus with hyperglycemia: Secondary | ICD-10-CM | POA: Insufficient documentation

## 2020-07-24 DIAGNOSIS — Z992 Dependence on renal dialysis: Secondary | ICD-10-CM | POA: Insufficient documentation

## 2020-07-24 DIAGNOSIS — I5032 Chronic diastolic (congestive) heart failure: Secondary | ICD-10-CM | POA: Insufficient documentation

## 2020-07-24 DIAGNOSIS — N184 Chronic kidney disease, stage 4 (severe): Secondary | ICD-10-CM

## 2020-07-24 DIAGNOSIS — R531 Weakness: Secondary | ICD-10-CM | POA: Diagnosis present

## 2020-07-24 LAB — BASIC METABOLIC PANEL
Anion gap: 11 (ref 5–15)
BUN: 50 mg/dL — ABNORMAL HIGH (ref 8–23)
CO2: 24 mmol/L (ref 22–32)
Calcium: 8.8 mg/dL — ABNORMAL LOW (ref 8.9–10.3)
Chloride: 97 mmol/L — ABNORMAL LOW (ref 98–111)
Creatinine, Ser: 5.15 mg/dL — ABNORMAL HIGH (ref 0.44–1.00)
GFR, Estimated: 9 mL/min — ABNORMAL LOW (ref >60)
Glucose, Bld: 275 mg/dL — ABNORMAL HIGH (ref 70–99)
Potassium: 3.6 mmol/L (ref 3.5–5.1)
Sodium: 132 mmol/L — ABNORMAL LOW (ref 135–145)

## 2020-07-24 MED ORDER — OXYCODONE-ACETAMINOPHEN 5-325 MG PO TABS: 1.0000 | ORAL_TABLET | Freq: Four times a day (QID) | ORAL | 0 refills | Status: DC | PRN

## 2020-07-24 MED ORDER — OXYCODONE-ACETAMINOPHEN 5-325 MG PO TABS
1.0000 | ORAL_TABLET | Freq: Once | ORAL | Status: AC
Start: 2020-07-24 — End: 2020-07-24
  Administered 2020-07-24: 1 via ORAL
  Filled 2020-07-24: qty 1

## 2020-07-24 NOTE — ED Provider Notes (Signed)
[facility] EMERGENCY DEPARTMENT Provider Note   CSN: [id number] Arrival date & time: 07/24/20  1915     History Chief Complaint  Patient presents with  . Pain    Erica [name] is a 65 y.o. female.  Patient complains of just aching all over.  She is a dialysis patient and she went yesterday  The history is provided by the patient and medical records.  Weakness Severity:  Mild Onset quality:  Gradual Timing:  Intermittent Progression:  Waxing and waning Chronicity:  Recurrent Context: not alcohol use   Relieved by:  Nothing Associated symptoms: arthralgias   Associated symptoms: no abdominal pain, no chest pain, no cough, no diarrhea, no frequency, no headaches and no seizures        Past Medical History:  Diagnosis Date  . Alcohol-induced pancreatitis   . Chronic diarrhea   . Depression   . Diabetes mellitus    fasting blood sugar 110-120s  . Dialysis patient ([place])   . Diastolic CHF ([place])   . DKA (diabetic ketoacidoses)   . Gastroparesis   . GERD (gastroesophageal reflux disease)   . Heart murmur   . History of kidney stones   . Hyperlipidemia   . Hypertension   . Hypokalemia   . Muscle spasm   . Neuropathic pain   . Neuropathy    Hx: of  . Pyelonephritis   . Seizures ([place])   . Vitamin B12 deficiency   . Vitamin D deficiency     Patient Active Problem List   Diagnosis Date Noted  . Pressure injury of skin 06/09/2020  . Sacral fracture ([place]) 06/08/2020  . Thrombocytopenia ([place]) 06/08/2020  . Hypertensive urgency 06/08/2020  . (HFpEF) heart failure with preserved ejection fraction ([employer]) 06/08/2020  . Fall 06/08/2020  . Elevated MCV 06/08/2020  . Palliative care by specialist   . Generalized weakness 11/14/2019  . HCAP (healthcare-associated pneumonia) 11/14/2019  . Hyperkalemia 11/13/2019  . Hyponatremia 09/27/2019  . Closed fracture of right proximal humerus 04/20/2019  . Surgery, elective   . Seizure ([place]) 11/12/2018  . History of  hydronephrosis --stents in place 12/08/2017  . Stroke ([place]) 11/29/2017  . Seizures ([place])   . Hydronephrosis   . Recurrent UTI   . Diabetes mellitus type 2 in nonobese (HCC)   . ESRD (end stage renal disease) ([place])   . Anemia of chronic disease   . Chronic diastolic congestive heart failure ([place])   . Essential hypertension   . PICC (peripherally inserted central catheter) in place   . Acute pyelonephritis 11/12/2017  . Uncontrolled type 2 diabetes mellitus with hyperglycemia, with long-term current use of insulin ([place]) 11/10/2017  . Renal failure 11/08/2017  . Seizure disorder ([place]) 11/08/2017  . Orthostatic hypotension 07/25/2014  . Moderate protein malnutrition ([place]) 09/15/2013  . Aspiration pneumonia ([place]) 09/15/2013  . Acute respiratory failure requiring reintubation ([place]) 09/11/2013  . probable Seizures due to metabolic disorder [id number]  . Lactic acidosis 03/19/2013  . Abdominal pain 03/19/2013  . Rotavirus infection 10/29/2012  . Type II or unspecified type diabetes mellitus without mention of complication, uncontrolled 10/29/2012  . Protein-calorie malnutrition, severe ([place]) 10/27/2012  . NSTEMI (non-ST elevated myocardial infarction) ([place]) 10/26/2012  . Fever, unspecified 10/26/2012  . Hypotension 10/25/2012  . Metabolic acidosis XX123456  . Chronic diarrhea 10/25/2012  . Tobacco abuse 10/25/2012  . DKA (diabetic ketoacidoses) 09/09/2012  . Dehydration 09/09/2012  . DKA, type 2 ([place]) 05/20/2012  . Abnormal LFTs 05/20/2012  . Heart murmur, systolic  05/19/2012  . Hypoglycemia 07/08/2011  . Metabolic encephalopathy XX123456  . Alcohol abuse 07/08/2011  . Hypokalemia 07/08/2011  . Nausea & vomiting 07/08/2011  . H/O chronic pancreatitis 07/08/2011    Past Surgical History:  Procedure Laterality Date  . AV FISTULA PLACEMENT Left 12/15/2017   Procedure: INSERTION OF ARTERIOVENOUS (AV) GORE-TEX GRAFT ARM;  Surgeon: Rosetta Posner, MD;  Location: [place];  Service:  Vascular;  Laterality: Left;  . CATARACT EXTRACTION W/PHACO Right 03/14/2015   Procedure: CATARACT EXTRACTION PHACO AND INTRAOCULAR LENS PLACEMENT (IOC);  Surgeon: Baruch Goldmann, MD;  Location: [facility];  Service: Ophthalmology;  Laterality: Right;  CDE:11.13  . CATARACT EXTRACTION W/PHACO Left 04/11/2015   Procedure: CATARACT EXTRACTION PHACO AND INTRAOCULAR LENS PLACEMENT LEFT EYE CDE=9.68;  Surgeon: Baruch Goldmann, MD;  Location: [facility];  Service: Ophthalmology;  Laterality: Left;  . COLONOSCOPY  02/24/2010  . cystoscopy with ureteral stent  Bilateral 10/07/2017   At [place] CV LINE RIGHT  11/17/2017  . IR FLUORO GUIDE CV LINE RIGHT  11/22/2017  . IR REMOVAL TUN CV CATH W/O FL  01/26/2018  . IR US GUIDE VASC ACCESS RIGHT  11/17/2017  . MULTIPLE EXTRACTIONS WITH ALVEOLOPLASTY N/A 06/13/2012   Procedure: MULTIPLE EXTRACION WITH ALVEOLOPLASTY EXTRACT: 18, 19, 20, 21, 22, 24, 25, 27, 28, 29, 30, 31;  Surgeon: Gae Bon, DDS;  Location: [place];  Service: Oral Surgery;  Laterality: N/A;  . ORIF HUMERUS FRACTURE Right 04/20/2019   Procedure: OPEN REDUCTION INTERNAL FIXATION (ORIF) PROXIMAL HUMERUS FRACTURE;  Surgeon: Marchia Bond, MD;  Location: [place];  Service: Orthopedics;  Laterality: Right;  . TUBAL LIGATION    . URETERAL STENT PLACEMENT  09/2017     OB History    Gravida      Para      Term      Preterm      AB      Living  2     SAB      IAB      Ectopic      Multiple      Live Births              Family History  Problem Relation Age of Onset  . Diabetes Sister   . Chronic Renal Failure Neg Hx     Social History   Tobacco Use  . Smoking status: Never Smoker  . Smokeless tobacco: Current User    Types: Snuff  Vaping Use  . Vaping Use: Never used  Substance Use Topics  . Alcohol use: Not Currently    Alcohol/week: 1.0 standard drink    Types: 1 Cans of beer per week  . Drug use: No    Home Medications Prior to Admission  medications   Medication Sig Start Date End Date Taking? Authorizing Provider  acetaminophen (TYLENOL) 325 MG tablet Take 650 mg by mouth every 4 (four) hours as needed for moderate pain.    [provider]  albuterol (VENTOLIN HFA) 108 (90 Base) MCG/ACT inhaler Inhale 2 puffs into the lungs every 4 (four) hours as needed for wheezing or shortness of breath.     [provider]  calcitRIOL (ROCALTROL) 0.25 MCG capsule Take 1 capsule (0.25 mcg total) by mouth Every Tuesday,Thursday,and Saturday with dialysis. 12/18/17   Love, Ivan Anchors, PA-C  carboxymethylcellulose (ARTIFICIAL TEARS) 1 % ophthalmic solution Place 1 drop into both eyes daily as needed (dry eyes). Patient not taking: Reported  on 06/08/2020    [provider]  carvedilol (COREG) 6.25 MG tablet Take 1 tablet (6.25 mg total) by mouth 2 (two) times daily. 11/15/18   Kinnie Feil, MD  cinacalcet (SENSIPAR) 30 MG tablet Take 30 mg by mouth every evening.    [provider]  Emollient (CETAPHIL) cream Apply 1 application topically every 12 (twelve) hours as needed (dry skin). Patient not taking: Reported on 06/08/2020    [provider]  ferric citrate (AURYXIA) 1 GM 210 MG(Fe) tablet Take 2 tablets (420 mg total) by mouth 3 (three) times daily with meals. 11/15/18   Kinnie Feil, MD  folic acid (FOLVITE) 1 MG tablet Take 1 mg by mouth every morning.    [provider]  hydrALAZINE (APRESOLINE) 25 MG tablet Take 1 tablet (25 mg total) by mouth every 8 (eight) hours. 11/19/19   Oswald Hillock, MD  HYDROcodone-acetaminophen (NORCO/VICODIN) 5-325 MG tablet Take 1 tablet by mouth every 4 (four) hours as needed for moderate pain or severe pain. 06/10/20   Manuella Ghazi, Pratik D, DO  insulin lispro (HUMALOG) 100 UNIT/ML injection Inject 2-10 Units into the skin 4 (four) times daily. Per sliding scale: CBG 151-200 2 units, 201-250 4 units, 251-300 6 units, 301-350 8 units, 351-400 10 units,    [provider]  levETIRAcetam (KEPPRA) 1000 MG tablet Take 1,000 mg by mouth every morning.    [provider]  loperamide (IMODIUM) 2 MG capsule Take 4 mg by mouth daily as needed for diarrhea or loose stools.    [provider]  losartan (COZAAR) 25 MG tablet Take 1 tablet (25 mg total) by mouth daily. 11/20/19   Oswald Hillock, MD  metoCLOPramide (REGLAN) 5 MG tablet Take 1 tablet (5 mg total) by mouth every 6 (six) hours as needed for nausea, vomiting or refractory nausea / vomiting. 11/19/19   Oswald Hillock, MD  NIFEdipine (ADALAT CC) 60 MG 24 hr tablet Take 1 tablet (60 mg total) by mouth every morning. 11/20/19   Oswald Hillock, MD  NIFEdipine (PROCARDIA XL/NIFEDICAL XL) 60 MG 24 hr tablet Take 60 mg by mouth daily. 06/14/20   [provider]  NOVOLOG FLEXPEN 100 UNIT/ML FlexPen Inject into the skin. 07/16/20   [provider]  oxyCODONE-acetaminophen (PERCOCET) 5-325 MG tablet Take 1 tablet by mouth every 6 (six) hours as needed. 07/24/20   Milton Ferguson, MD  pantoprazole (PROTONIX) 40 MG tablet Take 1 tablet (40 mg total) by mouth daily. 11/19/19 01/18/20  Oswald Hillock, MD  polyethylene glycol (MIRALAX) 17 g packet Take 17 g by mouth daily. Patient not taking: Reported on 06/08/2020 08/10/19   Tacy Learn, PA-C  promethazine (PHENERGAN) 25 MG tablet Take 25-50 mg by mouth every 8 (eight) hours as needed. 07/16/20   [provider]  rOPINIRole (REQUIP) 1 MG tablet Take 1 mg by mouth 3 (three) times daily.    [provider]  sertraline (ZOLOFT) 50 MG tablet Take 1 tablet by mouth daily. 07/16/20   [provider]  Vitamin D, Ergocalciferol, (DRISDOL) 1.25 MG (50000 UT) CAPS capsule Take 50,000 Units by mouth every Friday.    [provider]    Allergies    Aspirin  Review of Systems   Review of Systems  Constitutional: Negative for appetite change and fatigue.  HENT: Negative for congestion, ear discharge and sinus  pressure.   Eyes: Negative for discharge.  Respiratory: Negative for cough.   Cardiovascular:  Negative for chest pain.  Gastrointestinal: Negative for abdominal pain and diarrhea.  Genitourinary: Negative for frequency and hematuria.  Musculoskeletal: Positive for arthralgias. Negative for back pain.  Skin: Negative for rash.  Neurological: Positive for weakness. Negative for seizures and headaches.  Psychiatric/Behavioral: Negative for hallucinations.    Physical Exam Updated Vital Signs BP (!) 162/63 (BP Location: Right Arm)   Pulse 74   Temp 98.1 F (36.7 C) (Oral)   Resp 14   Ht 5' (1.524 m)   Wt 36.3 kg   SpO2 99%   BMI 15.62 kg/m   Physical Exam Vitals and nursing note reviewed.  Constitutional:      Appearance: She is well-developed.  HENT:     Head: Normocephalic.     Mouth/Throat:     Mouth: Mucous membranes are moist.  Eyes:     General: No scleral icterus.    Conjunctiva/sclera: Conjunctivae normal.  Neck:     Thyroid: No thyromegaly.  Cardiovascular:     Rate and Rhythm: Normal rate and regular rhythm.     Heart sounds: No murmur heard. No friction rub. No gallop.   Pulmonary:     Breath sounds: No stridor. No wheezing or rales.  Chest:     Chest wall: No tenderness.  Abdominal:     General: There is no distension.     Tenderness: There is no abdominal tenderness. There is no rebound.  Musculoskeletal:        General: Normal range of motion.     Cervical back: Neck supple.  Lymphadenopathy:     Cervical: No cervical adenopathy.  Skin:    Findings: No erythema or rash.  Neurological:     Mental Status: She is oriented to person, place, and time.     Motor: No abnormal muscle tone.     Coordination: Coordination normal.  Psychiatric:        Behavior: Behavior normal.     ED Results / Procedures / Treatments   Labs (all labs ordered are listed, but only abnormal results are displayed) Labs Reviewed  BASIC METABOLIC PANEL - Abnormal;  Notable for the following components:      Result Value   Sodium 132 (*)    Chloride 97 (*)    Glucose, Bld 275 (*)    BUN 50 (*)    Creatinine, Ser 5.15 (*)    Calcium 8.8 (*)    GFR, Estimated 9 (*)    All other components within normal limits    EKG None  Radiology No results found.  Procedures Procedures   Medications Ordered in ED Medications  oxyCODONE-acetaminophen (PERCOCET/ROXICET) 5-325 MG per tablet 1 tablet (1 tablet Oral Given 07/24/20 2052)    ED Course  I have reviewed the triage vital signs and the nursing notes.  Pertinent labs & imaging results that were available during my care of the patient were reviewed by me and considered in my medical decision making (see chart for details).    MDM Rules/Calculators/A&P                          Patient with mildly elevated glucose and renal failure which she gets dialysis for.  She will be given some pain medicine and follow-up with her doctor Final Clinical Impression(s) / ED Diagnoses Final diagnoses:  Chronic renal failure, stage 4 (severe) ([place])    Rx / DC Orders ED Discharge Orders  Ordered    oxyCODONE-acetaminophen (PERCOCET) 5-325 MG tablet  Every 6 hours PRN,   Status:  Discontinued        07/24/20 2140    oxyCODONE-acetaminophen (PERCOCET) 5-325 MG tablet  Every 6 hours PRN        07/24/20 2141           Milton Ferguson, MD 07/24/20 2147

## 2020-07-24 NOTE — ED Notes (Signed)
Son calvin otw to get pt

## 2020-07-24 NOTE — ED Triage Notes (Signed)
[facility] from home with cc of pain all over. --- legs, belly, chest. Had dialsys yesterday

## 2020-07-24 NOTE — Discharge Instructions (Signed)
Follow-up with your family doctor next week for recheck. 

## 2020-07-31 ENCOUNTER — Emergency Department (HOSPITAL_COMMUNITY): Payer: Medicare Other

## 2020-07-31 ENCOUNTER — Emergency Department (HOSPITAL_COMMUNITY)
Admission: EM | Admit: 2020-07-31 | Discharge: 2020-07-31 | Disposition: A | Discharge status: Home / Self Care | Payer: Medicare Other | Attending: Emergency Medicine | Admitting: Emergency Medicine

## 2020-07-31 ENCOUNTER — Other Ambulatory Visit: Payer: Self-pay

## 2020-07-31 ENCOUNTER — Encounter (HOSPITAL_COMMUNITY): Payer: Self-pay

## 2020-07-31 DIAGNOSIS — W19XXXA Unspecified fall, initial encounter: Secondary | ICD-10-CM | POA: Insufficient documentation

## 2020-07-31 DIAGNOSIS — S0990XA Unspecified injury of head, initial encounter: Secondary | ICD-10-CM

## 2020-07-31 DIAGNOSIS — Y92009 Unspecified place in unspecified non-institutional (private) residence as the place of occurrence of the external cause: Secondary | ICD-10-CM | POA: Diagnosis not present

## 2020-07-31 DIAGNOSIS — F17220 Nicotine dependence, chewing tobacco, uncomplicated: Secondary | ICD-10-CM | POA: Diagnosis not present

## 2020-07-31 DIAGNOSIS — Z992 Dependence on renal dialysis: Secondary | ICD-10-CM | POA: Diagnosis not present

## 2020-07-31 DIAGNOSIS — I132 Hypertensive heart and chronic kidney disease with heart failure and with stage 5 chronic kidney disease, or end stage renal disease: Secondary | ICD-10-CM | POA: Diagnosis not present

## 2020-07-31 DIAGNOSIS — M25511 Pain in right shoulder: Secondary | ICD-10-CM | POA: Diagnosis not present

## 2020-07-31 DIAGNOSIS — I5032 Chronic diastolic (congestive) heart failure: Secondary | ICD-10-CM | POA: Insufficient documentation

## 2020-07-31 DIAGNOSIS — N186 End stage renal disease: Secondary | ICD-10-CM | POA: Insufficient documentation

## 2020-07-31 NOTE — ED Provider Notes (Signed)
Emergency Department Provider Note   I have reviewed the triage vital signs and the nursing notes.   HISTORY  Chief Complaint Fall   HPI Erica [name] is a 65 y.o. female presents to the emergency department for evaluation after mechanical fall.  Patient uses a walker and states that she went to reach for something and the walker slid out from under her and she fell to the ground.  She is complaining of pain mainly in the hip and arm.  Also having some headache.  She denies any syncope neck pain, chest pain, shortness of breath. No numbness/tingling.   Past Medical History:  Diagnosis Date   Alcohol-induced pancreatitis    Chronic diarrhea    Depression    Diabetes mellitus    fasting blood sugar 110-120s   Dialysis patient ([place])    Diastolic CHF ([place])    DKA (diabetic ketoacidoses)    Gastroparesis    GERD (gastroesophageal reflux disease)    Heart murmur    History of kidney stones    Hyperlipidemia    Hypertension    Hypokalemia    Muscle spasm    Neuropathic pain    Neuropathy    Hx: of   Pyelonephritis    Seizures ([place])    Vitamin B12 deficiency    Vitamin D deficiency     Patient Active Problem List   Diagnosis Date Noted   Pressure injury of skin 06/09/2020   Sacral fracture ([place]) 06/08/2020   Thrombocytopenia ([place]) 06/08/2020   Hypertensive urgency 06/08/2020   (HFpEF) heart failure with preserved ejection fraction ([place]) 06/08/2020   Fall 06/08/2020   Elevated MCV 06/08/2020   Palliative care by specialist    Generalized weakness 11/14/2019   HCAP (healthcare-associated pneumonia) 11/14/2019   Hyperkalemia 11/13/2019   Hyponatremia 09/27/2019   Closed fracture of right proximal humerus 04/20/2019   Surgery, elective    Seizure ([place]) 11/12/2018   History of hydronephrosis --stents in place 12/08/2017   Stroke ([place]) 11/29/2017   Seizures ([place])    Hydronephrosis    Recurrent UTI    Diabetes mellitus type 2 in nonobese ([place])    ESRD (end  stage renal disease) ([place])    Anemia of chronic disease    Chronic diastolic congestive heart failure ([place])    Essential hypertension    PICC (peripherally inserted central catheter) in place    Acute pyelonephritis 11/12/2017   Uncontrolled type 2 diabetes mellitus with hyperglycemia, with Octavious Zidek-term current use of insulin ([place]) 11/10/2017   Renal failure 11/08/2017   Seizure disorder ([place]) 11/08/2017   Orthostatic hypotension 07/25/2014   Moderate protein malnutrition ([place]) 09/15/2013   Aspiration pneumonia ([place]) 09/15/2013   Acute respiratory failure requiring reintubation ([place]) 09/11/2013   probable Seizures due to metabolic disorder XX123456   Lactic acidosis 03/19/2013   Abdominal pain 03/19/2013   Rotavirus infection 10/29/2012   Type II or unspecified type diabetes mellitus without mention of complication, uncontrolled 10/29/2012   Protein-calorie malnutrition, severe ([place]) 10/27/2012   NSTEMI (non-ST elevated myocardial infarction) ([place]) 10/26/2012   Fever, unspecified 10/26/2012   Hypotension XX123456   Metabolic acidosis XX123456   Chronic diarrhea 10/25/2012   Tobacco abuse 10/25/2012   DKA (diabetic ketoacidoses) 09/09/2012   Dehydration 09/09/2012   DKA, type 2 ([place]) 05/20/2012   Abnormal LFTs 05/20/2012   Heart murmur, systolic [id number]   Hypoglycemia XX123456   Metabolic encephalopathy XX123456   Alcohol abuse 07/08/2011   Hypokalemia 07/08/2011   Nausea & vomiting 07/08/2011  H/O chronic pancreatitis 07/08/2011    Past Surgical History:  Procedure Laterality Date   AV FISTULA PLACEMENT Left 12/15/2017   Procedure: INSERTION OF ARTERIOVENOUS (AV) GORE-TEX GRAFT ARM;  Surgeon: Rosetta Posner, MD;  Location: [place];  Service: Vascular;  Laterality: Left;   CATARACT EXTRACTION W/PHACO Right 03/14/2015   Procedure: CATARACT EXTRACTION PHACO AND INTRAOCULAR LENS PLACEMENT ([place]);  Surgeon: Baruch Goldmann, MD;  [facility];  Service: Ophthalmology;   Laterality: Right;  CDE:11.13   CATARACT EXTRACTION W/PHACO Left 04/11/2015   Procedure: CATARACT EXTRACTION PHACO AND INTRAOCULAR LENS PLACEMENT LEFT EYE CDE=9.68;  Surgeon: Baruch Goldmann, MD;  [facility];  Service: Ophthalmology;  Laterality: Left;   COLONOSCOPY  02/24/2010   cystoscopy with ureteral stent  Bilateral 10/07/2017   At [place] CV LINE RIGHT  11/17/2017   IR FLUORO GUIDE CV LINE RIGHT  11/22/2017   IR REMOVAL TUN CV CATH W/O FL  01/26/2018   IR US GUIDE VASC ACCESS RIGHT  11/17/2017   MULTIPLE EXTRACTIONS WITH ALVEOLOPLASTY N/A 06/13/2012   Procedure: MULTIPLE EXTRACION WITH ALVEOLOPLASTY EXTRACT: 18, 19, 20, 21, 22, 24, 25, 27, 28, 29, 30, 31;  Surgeon: Gae Bon, DDS;  Location: [place];  Service: Oral Surgery;  Laterality: N/A;   ORIF HUMERUS FRACTURE Right 04/20/2019   Procedure: OPEN REDUCTION INTERNAL FIXATION (ORIF) PROXIMAL HUMERUS FRACTURE;  Surgeon: Marchia Bond, MD;  Location: [facility];  Service: Orthopedics;  Laterality: Right;   TUBAL LIGATION     URETERAL STENT PLACEMENT  09/2017    Allergies Aspirin  Family History  Problem Relation Age of Onset   Diabetes Sister    Chronic Renal Failure Neg Hx     Social History Social History   Tobacco Use   Smoking status: Never   Smokeless tobacco: Current    Types: Snuff  Vaping Use   Vaping Use: Never used  Substance Use Topics   Alcohol use: Not Currently    Alcohol/week: 1.0 standard drink    Types: 1 Cans of beer per week   Drug use: No    Review of Systems  Constitutional: No fever/chills Eyes: No visual changes. ENT: No sore throat. Cardiovascular: Denies chest pain. Respiratory: Denies shortness of breath. Gastrointestinal: No abdominal pain.  No nausea, no vomiting.  No diarrhea.  No constipation. Genitourinary: Negative for dysuria. Musculoskeletal: Pain in the right shoulder.  Skin: Negative for rash. Neurological: Negative for focal weakness or numbness.  Positive HA.   10-point ROS otherwise negative.  ____________________________________________   PHYSICAL EXAM:  VITAL SIGNS: ED Triage Vitals  Enc Vitals Group     BP 07/31/20 1753 (!) 197/69     Pulse Rate 07/31/20 1753 81     Resp 07/31/20 1753 18     Temp 07/31/20 1753 98.2 F (36.8 C)     Temp Source 07/31/20 1753 Oral     SpO2 07/31/20 1753 98 %     Weight 07/31/20 1752 80 lb (36.3 kg)     Height 07/31/20 1752 5' (1.524 m)   Constitutional: Alert and oriented. Well appearing and in no acute distress. Eyes: Conjunctivae are normal. PERRL. EOMI. Head: Atraumatic. Nose: No congestion/rhinnorhea. Mouth/Throat: Mucous membranes are moist.  Neck: No stridor.  C collar in place.  Cardiovascular: Normal rate, regular rhythm. Good peripheral circulation. Grossly normal heart sounds.   Respiratory: Normal respiratory effort.  No retractions. Lungs CTAB. Gastrointestinal: Soft and nontender. No distention.  Musculoskeletal: Pain  in the right shoulder with diminished range of motion.  No deformity. Neurologic:  Normal speech and language. No gross focal neurologic deficits are appreciated.  Skin:  Skin is warm, dry and intact. No rash noted.  ____________________________________________  RADIOLOGY  CT imaging of the head and cervical spine reviewed.  Plain films of the right shoulder reviewed.  ____________________________________________   PROCEDURES  Procedure(s) performed:   Procedures  None  ____________________________________________   INITIAL IMPRESSION / ASSESSMENT AND PLAN / ED COURSE  Pertinent labs & imaging results that were available during my care of the patient were reviewed by me and considered in my medical decision making (see chart for details).   Patient presents to the emergency department after mechanical fall at home.  She is awake and alert.  Vital signs are within normal limits other than elevated blood pressure which is chronic for her.  CT  imaging of the head and cervical spine obtained and reviewed showing no acute findings.  Right shoulder x-ray with some chronic changes but no fracture/dislocation.  Plan for discharge home with supportive care and PCP follow-up.   ____________________________________________  FINAL CLINICAL IMPRESSION(S) / ED DIAGNOSES  Final diagnoses:  Fall, initial encounter  Injury of head, initial encounter  Acute pain of right shoulder    Note:  This document was prepared using Dragon voice recognition software and may include unintentional dictation errors.  Nanda Quinton, MD, [facility]    Orazio Weller, [facility], MD 08/03/20 [phone]

## 2020-07-31 NOTE — ED Notes (Signed)
Patient transported to X-ray 

## 2020-07-31 NOTE — Discharge Instructions (Addendum)
You were seen today in the emergency department after a fall.  The x-ray of your shoulder and CT scans of your head and neck were normal.  Did not show any broken bones or bleeding.  Please take Tylenol as needed for pain.  Please continue your dialysis and follow-up with your primary care doctor.

## 2020-07-31 NOTE — ED Notes (Signed)
Patient had bowel movement on herself.

## 2020-07-31 NOTE — ED Triage Notes (Signed)
Pt to er via ems, per ems pt fell and was down for about 20 minutes before she could get to the phone and call 911.  Pt states that she is here because she walks with a walker and it went ahead of her and she fell.  Pt denies loc, pt c/o head and shoulder and hip pain.

## 2020-07-31 NOTE — ED Notes (Signed)
Pt unable to sign discharge papers due to hurt right shoulder/arm

## 2020-07-31 NOTE — ED Notes (Signed)
Patient eating snack at this time.

## 2020-08-15 ENCOUNTER — Emergency Department (HOSPITAL_COMMUNITY): Payer: Medicare Other

## 2020-08-15 ENCOUNTER — Inpatient Hospital Stay (HOSPITAL_COMMUNITY)
Admission: EM | Admit: 2020-08-15 | Discharge: 2020-08-21 | DRG: 070 | Disposition: A | Discharge status: Skilled Nursing Facility | Payer: Medicare Other | Attending: Family Medicine | Admitting: Family Medicine

## 2020-08-15 ENCOUNTER — Other Ambulatory Visit: Payer: Self-pay

## 2020-08-15 ENCOUNTER — Encounter (HOSPITAL_COMMUNITY): Payer: Self-pay

## 2020-08-15 DIAGNOSIS — E1165 Type 2 diabetes mellitus with hyperglycemia: Secondary | ICD-10-CM | POA: Diagnosis present

## 2020-08-15 DIAGNOSIS — E1140 Type 2 diabetes mellitus with diabetic neuropathy, unspecified: Secondary | ICD-10-CM | POA: Diagnosis present

## 2020-08-15 DIAGNOSIS — Z794 Long term (current) use of insulin: Secondary | ICD-10-CM

## 2020-08-15 DIAGNOSIS — Z833 Family history of diabetes mellitus: Secondary | ICD-10-CM

## 2020-08-15 DIAGNOSIS — N2581 Secondary hyperparathyroidism of renal origin: Secondary | ICD-10-CM | POA: Diagnosis present

## 2020-08-15 DIAGNOSIS — Z79899 Other long term (current) drug therapy: Secondary | ICD-10-CM

## 2020-08-15 DIAGNOSIS — N186 End stage renal disease: Secondary | ICD-10-CM | POA: Diagnosis not present

## 2020-08-15 DIAGNOSIS — R4182 Altered mental status, unspecified: Secondary | ICD-10-CM

## 2020-08-15 DIAGNOSIS — E871 Hypo-osmolality and hyponatremia: Secondary | ICD-10-CM | POA: Diagnosis present

## 2020-08-15 DIAGNOSIS — K3184 Gastroparesis: Secondary | ICD-10-CM | POA: Diagnosis present

## 2020-08-15 DIAGNOSIS — I2510 Atherosclerotic heart disease of native coronary artery without angina pectoris: Secondary | ICD-10-CM | POA: Diagnosis present

## 2020-08-15 DIAGNOSIS — R7401 Elevation of levels of liver transaminase levels: Secondary | ICD-10-CM | POA: Diagnosis present

## 2020-08-15 DIAGNOSIS — G9341 Metabolic encephalopathy: Secondary | ICD-10-CM | POA: Diagnosis not present

## 2020-08-15 DIAGNOSIS — R9431 Abnormal electrocardiogram [ECG] [EKG]: Secondary | ICD-10-CM | POA: Diagnosis present

## 2020-08-15 DIAGNOSIS — Z8719 Personal history of other diseases of the digestive system: Secondary | ICD-10-CM | POA: Diagnosis not present

## 2020-08-15 DIAGNOSIS — I132 Hypertensive heart and chronic kidney disease with heart failure and with stage 5 chronic kidney disease, or end stage renal disease: Secondary | ICD-10-CM | POA: Diagnosis present

## 2020-08-15 DIAGNOSIS — Z20822 Contact with and (suspected) exposure to covid-19: Secondary | ICD-10-CM | POA: Diagnosis present

## 2020-08-15 DIAGNOSIS — K760 Fatty (change of) liver, not elsewhere classified: Secondary | ICD-10-CM | POA: Diagnosis present

## 2020-08-15 DIAGNOSIS — K861 Other chronic pancreatitis: Secondary | ICD-10-CM | POA: Diagnosis present

## 2020-08-15 DIAGNOSIS — I5032 Chronic diastolic (congestive) heart failure: Secondary | ICD-10-CM | POA: Diagnosis present

## 2020-08-15 DIAGNOSIS — E1143 Type 2 diabetes mellitus with diabetic autonomic (poly)neuropathy: Secondary | ICD-10-CM | POA: Diagnosis present

## 2020-08-15 DIAGNOSIS — R197 Diarrhea, unspecified: Secondary | ICD-10-CM

## 2020-08-15 DIAGNOSIS — E785 Hyperlipidemia, unspecified: Secondary | ICD-10-CM | POA: Diagnosis present

## 2020-08-15 DIAGNOSIS — E1122 Type 2 diabetes mellitus with diabetic chronic kidney disease: Secondary | ICD-10-CM | POA: Diagnosis present

## 2020-08-15 DIAGNOSIS — K529 Noninfective gastroenteritis and colitis, unspecified: Secondary | ICD-10-CM | POA: Diagnosis not present

## 2020-08-15 DIAGNOSIS — F32A Depression, unspecified: Secondary | ICD-10-CM | POA: Diagnosis present

## 2020-08-15 DIAGNOSIS — E7220 Disorder of urea cycle metabolism, unspecified: Secondary | ICD-10-CM | POA: Diagnosis present

## 2020-08-15 DIAGNOSIS — Z8673 Personal history of transient ischemic attack (TIA), and cerebral infarction without residual deficits: Secondary | ICD-10-CM

## 2020-08-15 DIAGNOSIS — K219 Gastro-esophageal reflux disease without esophagitis: Secondary | ICD-10-CM | POA: Diagnosis present

## 2020-08-15 DIAGNOSIS — K859 Acute pancreatitis without necrosis or infection, unspecified: Secondary | ICD-10-CM | POA: Diagnosis present

## 2020-08-15 DIAGNOSIS — I10 Essential (primary) hypertension: Secondary | ICD-10-CM | POA: Diagnosis present

## 2020-08-15 DIAGNOSIS — Z720 Tobacco use: Secondary | ICD-10-CM

## 2020-08-15 DIAGNOSIS — E860 Dehydration: Secondary | ICD-10-CM

## 2020-08-15 DIAGNOSIS — Z841 Family history of disorders of kidney and ureter: Secondary | ICD-10-CM

## 2020-08-15 DIAGNOSIS — E875 Hyperkalemia: Secondary | ICD-10-CM | POA: Diagnosis present

## 2020-08-15 DIAGNOSIS — Z992 Dependence on renal dialysis: Secondary | ICD-10-CM

## 2020-08-15 DIAGNOSIS — Z886 Allergy status to analgesic agent status: Secondary | ICD-10-CM

## 2020-08-15 DIAGNOSIS — M898X9 Other specified disorders of bone, unspecified site: Secondary | ICD-10-CM | POA: Diagnosis present

## 2020-08-15 DIAGNOSIS — G40909 Epilepsy, unspecified, not intractable, without status epilepticus: Secondary | ICD-10-CM | POA: Diagnosis present

## 2020-08-15 LAB — CBC WITH DIFFERENTIAL/PLATELET
Abs Immature Granulocytes: 0.03 10*3/uL (ref 0.00–0.07)
Basophils Absolute: 0 10*3/uL (ref 0.0–0.1)
Basophils Relative: 0 %
Eosinophils Absolute: 0.2 10*3/uL (ref 0.0–0.5)
Eosinophils Relative: 3 %
HCT: 30.6 % — ABNORMAL LOW (ref 36.0–46.0)
Hemoglobin: 10 g/dL — ABNORMAL LOW (ref 12.0–15.0)
Immature Granulocytes: 0 %
Lymphocytes Relative: 12 %
Lymphs Abs: 1 10*3/uL (ref 0.7–4.0)
MCH: 35.5 pg — ABNORMAL HIGH (ref 26.0–34.0)
MCHC: 32.7 g/dL (ref 30.0–36.0)
MCV: 108.5 fL — ABNORMAL HIGH (ref 80.0–100.0)
Monocytes Absolute: 0.6 10*3/uL (ref 0.1–1.0)
Monocytes Relative: 8 %
Neutro Abs: 6.5 10*3/uL (ref 1.7–7.7)
Neutrophils Relative %: 77 %
Platelets: 159 10*3/uL (ref 150–400)
RBC: 2.82 MIL/uL — ABNORMAL LOW (ref 3.87–5.11)
RDW: 12.9 % (ref 11.5–15.5)
WBC: 8.3 10*3/uL (ref 4.0–10.5)
nRBC: 0 % (ref 0.0–0.2)

## 2020-08-15 LAB — GLUCOSE, CAPILLARY: Glucose-Capillary: 211 mg/dL — ABNORMAL HIGH (ref 70–99)

## 2020-08-15 LAB — COMPREHENSIVE METABOLIC PANEL
ALT: 65 U/L — ABNORMAL HIGH (ref 0–44)
AST: 42 U/L — ABNORMAL HIGH (ref 15–41)
Albumin: 3.3 g/dL — ABNORMAL LOW (ref 3.5–5.0)
Alkaline Phosphatase: 473 U/L — ABNORMAL HIGH (ref 38–126)
Anion gap: 18 — ABNORMAL HIGH (ref 5–15)
BUN: 70 mg/dL — ABNORMAL HIGH (ref 8–23)
CO2: 14 mmol/L — ABNORMAL LOW (ref 22–32)
Calcium: 8.1 mg/dL — ABNORMAL LOW (ref 8.9–10.3)
Chloride: 94 mmol/L — ABNORMAL LOW (ref 98–111)
Creatinine, Ser: 6.17 mg/dL — ABNORMAL HIGH (ref 0.44–1.00)
GFR, Estimated: 7 mL/min — ABNORMAL LOW (ref >60)
Glucose, Bld: 229 mg/dL — ABNORMAL HIGH (ref 70–99)
Potassium: 4.1 mmol/L (ref 3.5–5.1)
Sodium: 126 mmol/L — ABNORMAL LOW (ref 135–145)
Total Bilirubin: 0.7 mg/dL (ref 0.3–1.2)
Total Protein: 7 g/dL (ref 6.5–8.1)

## 2020-08-15 LAB — BLOOD GAS, VENOUS
Acid-base deficit: 5.2 mmol/L — ABNORMAL HIGH (ref 0.0–2.0)
Bicarbonate: 19.1 mmol/L — ABNORMAL LOW (ref 20.0–28.0)
FIO2: 21
O2 Saturation: 66.9 %
Patient temperature: 36.5
pCO2, Ven: 43.2 mmHg — ABNORMAL LOW (ref 44.0–60.0)
pH, Ven: 7.291 (ref 7.250–7.430)
pO2, Ven: 41.1 mmHg (ref 32.0–45.0)

## 2020-08-15 LAB — C DIFFICILE QUICK SCREEN W PCR REFLEX
C Diff antigen: NEGATIVE
C Diff interpretation: NOT DETECTED
C Diff toxin: NEGATIVE

## 2020-08-15 LAB — RESP PANEL BY RT-PCR (FLU A&B, COVID) ARPGX2
Influenza A by PCR: NEGATIVE
Influenza B by PCR: NEGATIVE
SARS Coronavirus 2 by RT PCR: NEGATIVE

## 2020-08-15 LAB — LIPASE, BLOOD: Lipase: 16 U/L (ref 11–51)

## 2020-08-15 LAB — LACTIC ACID, PLASMA
Lactic Acid, Venous: 1.8 mmol/L (ref 0.5–1.9)
Lactic Acid, Venous: 1.9 mmol/L (ref 0.5–1.9)

## 2020-08-15 LAB — ETHANOL: Alcohol, Ethyl (B): <10 mg/dL (ref <10)

## 2020-08-15 LAB — MAGNESIUM: Magnesium: 2.2 mg/dL (ref 1.7–2.4)

## 2020-08-15 LAB — AMMONIA: Ammonia: 45 umol/L — ABNORMAL HIGH (ref 9–35)

## 2020-08-15 MED ORDER — PANTOPRAZOLE SODIUM 40 MG PO TBEC
40.0000 mg | DELAYED_RELEASE_TABLET | Freq: Every day | ORAL | Status: DC
  Administered 2020-08-16 – 2020-08-21 (×6): 40 mg via ORAL
  Filled 2020-08-15 (×6): qty 1

## 2020-08-15 MED ORDER — ONDANSETRON HCL 4 MG PO TABS
4.0000 mg | ORAL_TABLET | Freq: Four times a day (QID) | ORAL | Status: DC | PRN
  Administered 2020-08-19: 4 mg via ORAL
  Filled 2020-08-15: qty 1

## 2020-08-15 MED ORDER — INSULIN ASPART 100 UNIT/ML IJ SOLN
0.0000 [IU] | Freq: Every day | INTRAMUSCULAR | Status: DC
  Administered 2020-08-15: 2 [IU] via SUBCUTANEOUS
  Administered 2020-08-17: 3 [IU] via SUBCUTANEOUS
  Administered 2020-08-19: 5 [IU] via SUBCUTANEOUS

## 2020-08-15 MED ORDER — HYDRALAZINE HCL 25 MG PO TABS
25.0000 mg | ORAL_TABLET | Freq: Three times a day (TID) | ORAL | Status: DC
  Administered 2020-08-16 – 2020-08-21 (×10): 25 mg via ORAL
  Filled 2020-08-15 (×13): qty 1

## 2020-08-15 MED ORDER — ONDANSETRON HCL 4 MG/2ML IJ SOLN
4.0000 mg | Freq: Four times a day (QID) | INTRAMUSCULAR | Status: DC | PRN
  Filled 2020-08-15: qty 2

## 2020-08-15 MED ORDER — ACETAMINOPHEN 325 MG PO TABS
650.0000 mg | ORAL_TABLET | Freq: Four times a day (QID) | ORAL | Status: DC | PRN
  Administered 2020-08-16: 650 mg via ORAL
  Filled 2020-08-15: qty 2

## 2020-08-15 MED ORDER — LEVETIRACETAM 500 MG PO TABS
1000.0000 mg | ORAL_TABLET | Freq: Every morning | ORAL | Status: DC
  Administered 2020-08-16 – 2020-08-21 (×6): 1000 mg via ORAL
  Filled 2020-08-15 (×6): qty 2

## 2020-08-15 MED ORDER — ACETAMINOPHEN 650 MG RE SUPP: 650.0000 mg | Freq: Four times a day (QID) | RECTAL | Status: DC | PRN

## 2020-08-15 MED ORDER — HYDRALAZINE HCL 20 MG/ML IJ SOLN: 10.0000 mg | INTRAMUSCULAR | Status: DC | PRN

## 2020-08-15 MED ORDER — CINACALCET HCL 30 MG PO TABS
30.0000 mg | ORAL_TABLET | Freq: Every evening | ORAL | Status: DC
  Administered 2020-08-17 – 2020-08-20 (×4): 30 mg via ORAL
  Filled 2020-08-15 (×6): qty 1

## 2020-08-15 MED ORDER — NIFEDIPINE ER OSMOTIC RELEASE 30 MG PO TB24
60.0000 mg | ORAL_TABLET | Freq: Every morning | ORAL | Status: DC
  Administered 2020-08-16 – 2020-08-21 (×5): 60 mg via ORAL
  Filled 2020-08-15 (×5): qty 2

## 2020-08-15 MED ORDER — HEPARIN SODIUM (PORCINE) 5000 UNIT/ML IJ SOLN
5000.0000 [IU] | Freq: Three times a day (TID) | INTRAMUSCULAR | Status: DC
  Administered 2020-08-15 – 2020-08-21 (×16): 5000 [IU] via SUBCUTANEOUS
  Filled 2020-08-15 (×17): qty 1

## 2020-08-15 MED ORDER — CHLORHEXIDINE GLUCONATE CLOTH 2 % EX PADS
6.0000 | MEDICATED_PAD | Freq: Every day | CUTANEOUS | Status: DC
  Administered 2020-08-16 – 2020-08-21 (×6): 6 via TOPICAL

## 2020-08-15 MED ORDER — LOSARTAN POTASSIUM 50 MG PO TABS
25.0000 mg | ORAL_TABLET | Freq: Every day | ORAL | Status: DC
  Administered 2020-08-16 – 2020-08-21 (×5): 25 mg via ORAL
  Filled 2020-08-15 (×6): qty 1

## 2020-08-15 MED ORDER — POLYETHYLENE GLYCOL 3350 17 G PO PACK: 17.0000 g | PACK | Freq: Every day | ORAL | Status: DC | PRN

## 2020-08-15 MED ORDER — INSULIN ASPART 100 UNIT/ML IJ SOLN
0.0000 [IU] | Freq: Three times a day (TID) | INTRAMUSCULAR | Status: DC
  Administered 2020-08-16: 1 [IU] via SUBCUTANEOUS
  Administered 2020-08-17: 2 [IU] via SUBCUTANEOUS
  Administered 2020-08-18 (×2): 1 [IU] via SUBCUTANEOUS
  Administered 2020-08-18: 3 [IU] via SUBCUTANEOUS
  Administered 2020-08-19: 4 [IU] via SUBCUTANEOUS
  Administered 2020-08-19: 3 [IU] via SUBCUTANEOUS
  Administered 2020-08-19: 1 [IU] via SUBCUTANEOUS
  Administered 2020-08-20: 0 [IU] via SUBCUTANEOUS
  Administered 2020-08-20: 3 [IU] via SUBCUTANEOUS
  Administered 2020-08-21: 2 [IU] via SUBCUTANEOUS

## 2020-08-15 MED ORDER — SODIUM CHLORIDE 0.9 % IV SOLN: INTRAVENOUS | Status: AC

## 2020-08-15 MED ORDER — CARVEDILOL 3.125 MG PO TABS
6.2500 mg | ORAL_TABLET | Freq: Two times a day (BID) | ORAL | Status: DC
  Administered 2020-08-16 – 2020-08-21 (×9): 6.25 mg via ORAL
  Filled 2020-08-15 (×10): qty 2

## 2020-08-15 MED ORDER — LACTATED RINGERS IV BOLUS
250.0000 mL | Freq: Once | INTRAVENOUS | Status: AC
  Administered 2020-08-15: 250 mL via INTRAVENOUS

## 2020-08-15 MED ORDER — LORAZEPAM 0.5 MG PO TABS
0.5000 mg | ORAL_TABLET | Freq: Once | ORAL | Status: DC
  Filled 2020-08-15: qty 1

## 2020-08-15 MED ORDER — CALCITRIOL 0.25 MCG PO CAPS
0.2500 ug | ORAL_CAPSULE | ORAL | Status: DC
  Administered 2020-08-17: 0.25 ug via ORAL
  Filled 2020-08-15: qty 1

## 2020-08-15 NOTE — ED Notes (Signed)
Lab obtained blood samples and cultures, rt at bedside for abg, x ray at bedside

## 2020-08-15 NOTE — ED Notes (Signed)
Attempt to give report x1. Floor will call back

## 2020-08-15 NOTE — ED Provider Notes (Signed)
[facility] EMERGENCY DEPARTMENT Provider Note   CSN: [id number] Arrival date & time: 08/15/20  1253     History Chief Complaint  Patient presents with   Abdominal Pain    Erica [name] is a 65 y.o. female with extensive past medical history as detailed below, pertinence include HFpEF, ESRD on HD Tuesday Thursday Saturday, DM, chronic diarrhea, alcohol induced pancreatitis, stroke, seizures, who presents today for evaluation of diarrhea.  Level 5 caveat for altered mental status.  Patient has reportedly been sick over the past approximately 2 days with vomiting and diarrhea.  Patient states that her stomach hurts.  She last had dialysis on Saturday.  She missed her Tuesday session as she was not feeling well and they would not dialyze her.  Sugar was in the 200s with ems.   HPI     Past Medical History:  Diagnosis Date   Alcohol-induced pancreatitis    Chronic diarrhea    Depression    Diabetes mellitus    fasting blood sugar 110-120s   Dialysis patient ([place])    Diastolic CHF ([place])    DKA (diabetic ketoacidoses)    Gastroparesis    GERD (gastroesophageal reflux disease)    Heart murmur    History of kidney stones    Hyperlipidemia    Hypertension    Hypokalemia    Muscle spasm    Neuropathic pain    Neuropathy    Hx: of   Pyelonephritis    Seizures ([place])    Vitamin B12 deficiency    Vitamin D deficiency     Patient Active Problem List   Diagnosis Date Noted   Pressure injury of skin 06/09/2020   Sacral fracture ([place]) 06/08/2020   Thrombocytopenia ([place]) 06/08/2020   Hypertensive urgency 06/08/2020   (HFpEF) heart failure with preserved ejection fraction ([place]) 06/08/2020   Fall 06/08/2020   Elevated MCV 06/08/2020   Palliative care by specialist    Generalized weakness 11/14/2019   HCAP (healthcare-associated pneumonia) 11/14/2019   Hyperkalemia 11/13/2019   Hyponatremia 09/27/2019   Closed fracture of right proximal humerus 04/20/2019   Surgery,  elective    Seizure ([place]) 11/12/2018   History of hydronephrosis --stents in place 12/08/2017   Stroke ([place]) 11/29/2017   Seizures ([place])    Hydronephrosis    Recurrent UTI    Diabetes mellitus type 2 in nonobese (HCC)    ESRD (end stage renal disease) (HCC)    Anemia of chronic disease    Chronic diastolic congestive heart failure ([place])    Essential hypertension    PICC (peripherally inserted central catheter) in place    Acute pyelonephritis 11/12/2017   Uncontrolled type 2 diabetes mellitus with hyperglycemia, with long-term current use of insulin ([place]) 11/10/2017   Renal failure 11/08/2017   Seizure disorder ([place]) 11/08/2017   Orthostatic hypotension 07/25/2014   Moderate protein malnutrition ([place]) 09/15/2013   Aspiration pneumonia ([place]) 09/15/2013   Acute respiratory failure requiring reintubation ([place]) 09/11/2013   probable Seizures due to metabolic disorder 54/65/6812   Lactic acidosis 03/19/2013   Abdominal pain 03/19/2013   Rotavirus infection 10/29/2012   Type II or unspecified type diabetes mellitus without mention of complication, uncontrolled 10/29/2012   Protein-calorie malnutrition, severe ([place]) 10/27/2012   NSTEMI (non-ST elevated myocardial infarction) ([place]) 10/26/2012   Fever, unspecified 10/26/2012   Hypotension 75/17/0017   Metabolic acidosis 49/44/9675   Chronic diarrhea 10/25/2012   Tobacco abuse 10/25/2012   DKA (diabetic ketoacidoses) 09/09/2012   Dehydration 09/09/2012   DKA,  type 2 ([place]) 05/20/2012   Abnormal LFTs 05/20/2012   Heart murmur, systolic 93/81/0175   Hypoglycemia 12/10/8525   Metabolic encephalopathy 78/24/2353   Alcohol abuse 07/08/2011   Hypokalemia 07/08/2011   Nausea & vomiting 07/08/2011   H/O chronic pancreatitis 07/08/2011    Past Surgical History:  Procedure Laterality Date   AV FISTULA PLACEMENT Left 12/15/2017   Procedure: INSERTION OF ARTERIOVENOUS (AV) GORE-TEX GRAFT ARM;  Surgeon: Rosetta Posner, MD;  Location: [facility] OR;   Service: Vascular;  Laterality: Left;   CATARACT EXTRACTION W/PHACO Right 03/14/2015   Procedure: CATARACT EXTRACTION PHACO AND INTRAOCULAR LENS PLACEMENT ([place]);  Surgeon: Baruch Goldmann, MD;  Location: [facility] ORS;  Service: Ophthalmology;  Laterality: Right;  CDE:11.13   CATARACT EXTRACTION W/PHACO Left 04/11/2015   Procedure: CATARACT EXTRACTION PHACO AND INTRAOCULAR LENS PLACEMENT LEFT EYE CDE=9.68;  Surgeon: Baruch Goldmann, MD;  Location: [facility];  Service: Ophthalmology;  Laterality: Left;   COLONOSCOPY  02/24/2010   cystoscopy with ureteral stent  Bilateral 10/07/2017   At [place] CV LINE RIGHT  11/17/2017   IR FLUORO GUIDE CV LINE RIGHT  11/22/2017   IR REMOVAL TUN CV CATH W/O FL  01/26/2018   IR US GUIDE VASC ACCESS RIGHT  11/17/2017   MULTIPLE EXTRACTIONS WITH ALVEOLOPLASTY N/A 06/13/2012   Procedure: MULTIPLE EXTRACION WITH ALVEOLOPLASTY EXTRACT: 18, 19, 20, 21, 22, 24, 25, 27, 28, 29, 30, 31;  Surgeon: Gae Bon, DDS;  Location: [place];  Service: Oral Surgery;  Laterality: N/A;   ORIF HUMERUS FRACTURE Right 04/20/2019   Procedure: OPEN REDUCTION INTERNAL FIXATION (ORIF) PROXIMAL HUMERUS FRACTURE;  Surgeon: Marchia Bond, MD;  Location: [place];  Service: Orthopedics;  Laterality: Right;   TUBAL LIGATION     URETERAL STENT PLACEMENT  09/2017     OB History     Gravida      Para      Term      Preterm      AB      Living  2      SAB      IAB      Ectopic      Multiple      Live Births              Family History  Problem Relation Age of Onset   Diabetes Sister    Chronic Renal Failure Neg Hx     Social History   Tobacco Use   Smoking status: Never   Smokeless tobacco: Current    Types: Snuff  Vaping Use   Vaping Use: Never used  Substance Use Topics   Alcohol use: Not Currently    Alcohol/week: 1.0 standard drink    Types: 1 Cans of beer per week   Drug use: No    Home Medications Prior to Admission medications    Medication Sig Start Date End Date Taking? Authorizing Provider  acetaminophen (TYLENOL) 325 MG tablet Take 650 mg by mouth every 4 (four) hours as needed for moderate pain.    [provider]  albuterol (VENTOLIN HFA) 108 (90 Base) MCG/ACT inhaler Inhale 2 puffs into the lungs every 4 (four) hours as needed for wheezing or shortness of breath.     [provider]  calcitRIOL (ROCALTROL) 0.25 MCG capsule Take 1 capsule (0.25 mcg total) by mouth Every Tuesday,Thursday,and Saturday with dialysis. 12/18/17   Love, Ivan Anchors, PA-C  carboxymethylcellulose (ARTIFICIAL TEARS) 1 % ophthalmic solution Place  1 drop into both eyes daily as needed (dry eyes). Patient not taking: Reported on 06/08/2020    [provider]  carvedilol (COREG) 6.25 MG tablet Take 1 tablet (6.25 mg total) by mouth 2 (two) times daily. 11/15/18   Kinnie Feil, MD  cinacalcet (SENSIPAR) 30 MG tablet Take 30 mg by mouth every evening.    [provider]  Emollient (CETAPHIL) cream Apply 1 application topically every 12 (twelve) hours as needed (dry skin). Patient not taking: Reported on 06/08/2020    [provider]  ferric citrate (AURYXIA) 1 GM 210 MG(Fe) tablet Take 2 tablets (420 mg total) by mouth 3 (three) times daily with meals. 11/15/18   Kinnie Feil, MD  folic acid (FOLVITE) 1 MG tablet Take 1 mg by mouth every morning.    [provider]  hydrALAZINE (APRESOLINE) 25 MG tablet Take 1 tablet (25 mg total) by mouth every 8 (eight) hours. 11/19/19   Oswald Hillock, MD  HYDROcodone-acetaminophen (NORCO/VICODIN) 5-325 MG tablet Take 1 tablet by mouth every 4 (four) hours as needed for moderate pain or severe pain. 06/10/20   Manuella Ghazi, Pratik D, DO  insulin lispro (HUMALOG) 100 UNIT/ML injection Inject 2-10 Units into the skin 4 (four) times daily. Per sliding scale: CBG 151-200 2 units, 201-250 4 units, 251-300 6 units, 301-350 8 units, 351-400 10 units,    [provider]   levETIRAcetam (KEPPRA) 1000 MG tablet Take 1,000 mg by mouth every morning.    [provider]  loperamide (IMODIUM) 2 MG capsule Take 4 mg by mouth daily as needed for diarrhea or loose stools.    [provider]  losartan (COZAAR) 25 MG tablet Take 1 tablet (25 mg total) by mouth daily. 11/20/19   Oswald Hillock, MD  metoCLOPramide (REGLAN) 5 MG tablet Take 1 tablet (5 mg total) by mouth every 6 (six) hours as needed for nausea, vomiting or refractory nausea / vomiting. 11/19/19   Oswald Hillock, MD  NIFEdipine (ADALAT CC) 60 MG 24 hr tablet Take 1 tablet (60 mg total) by mouth every morning. 11/20/19   Oswald Hillock, MD  NIFEdipine (PROCARDIA XL/NIFEDICAL XL) 60 MG 24 hr tablet Take 60 mg by mouth daily. 06/14/20   [provider]  NOVOLOG FLEXPEN 100 UNIT/ML FlexPen Inject into the skin. 07/16/20   [provider]  oxyCODONE-acetaminophen (PERCOCET) 5-325 MG tablet Take 1 tablet by mouth every 6 (six) hours as needed. 07/24/20   Milton Ferguson, MD  pantoprazole (PROTONIX) 40 MG tablet Take 1 tablet (40 mg total) by mouth daily. 11/19/19 01/18/20  Oswald Hillock, MD  polyethylene glycol (MIRALAX) 17 g packet Take 17 g by mouth daily. Patient not taking: Reported on 06/08/2020 08/10/19   Tacy Learn, PA-C  promethazine (PHENERGAN) 25 MG tablet Take 25-50 mg by mouth every 8 (eight) hours as needed. 07/16/20   [provider]  rOPINIRole (REQUIP) 1 MG tablet Take 1 mg by mouth 3 (three) times daily.    [provider]  sertraline (ZOLOFT) 50 MG tablet Take 1 tablet by mouth daily. 07/16/20   [provider]  Vitamin D, Ergocalciferol, (DRISDOL) 1.25 MG (50000 UT) CAPS capsule Take 50,000 Units by mouth every Friday.    [provider]    Allergies    Aspirin  Review of Systems   Review of Systems  Unable to perform ROS: Mental status change   Physical Exam Updated Vital Signs BP (!) 192/80 (BP  Location: Left Arm)   Pulse 73    Temp 98.2 F (36.8 C) (Oral)   Resp 19   Ht 5' (1.524 m)   Wt 42.2 kg   SpO2 100%   BMI 18.16 kg/m   Physical Exam Vitals and nursing note reviewed.  Constitutional:      General: She is not in acute distress.    Appearance: She is ill-appearing.     Comments: Chronically ill-appearing, frail appearing  HENT:     Head: Atraumatic.  Eyes:     Conjunctiva/sclera: Conjunctivae normal.  Cardiovascular:     Rate and Rhythm: Normal rate.  Pulmonary:     Effort: Pulmonary effort is normal. No respiratory distress.  Abdominal:     General: There is no distension.     Palpations: Abdomen is soft.     Tenderness: There is generalized abdominal tenderness (Patient grimaces with palpation of abdomen).  Musculoskeletal:     Cervical back: Normal range of motion and neck supple.     Comments: No obvious acute injury  Skin:    General: Skin is warm.  Neurological:     GCS: GCS eye subscore is 3. GCS verbal subscore is 4. GCS motor subscore is 6.     Comments: Patient's eyes are closed.  She opens them when instructed to however quickly closes them again.  She is able to follow simple commands.  Speech is difficult to understand and is only 1-2 words. Moves all 4 extremities spontaneously.  Psychiatric:     Comments: Unable to assess secondary to AMS    ED Results / Procedures / Treatments   Labs (all labs ordered are listed, but only abnormal results are displayed) Labs Reviewed  COMPREHENSIVE METABOLIC PANEL - Abnormal; Notable for the following components:      Result Value   Sodium 126 (*)    Chloride 94 (*)    CO2 14 (*)    Glucose, Bld 229 (*)    BUN 70 (*)    Creatinine, Ser 6.17 (*)    Calcium 8.1 (*)    Albumin 3.3 (*)    AST 42 (*)    ALT 65 (*)    Alkaline Phosphatase 473 (*)    GFR, Estimated 7 (*)    Anion gap 18 (*)    All other components within normal limits  CBC WITH DIFFERENTIAL/PLATELET - Abnormal; Notable for the following components:   RBC 2.82 (*)     Hemoglobin 10.0 (*)    HCT 30.6 (*)    MCV 108.5 (*)    MCH 35.5 (*)    All other components within normal limits  AMMONIA - Abnormal; Notable for the following components:   Ammonia 45 (*)    All other components within normal limits  BLOOD GAS, VENOUS - Abnormal; Notable for the following components:   pCO2, Ven 43.2 (*)    Bicarbonate 19.1 (*)    Acid-base deficit 5.2 (*)    All other components within normal limits  GLUCOSE, CAPILLARY - Abnormal; Notable for the following components:   Glucose-Capillary 211 (*)    All other components within normal limits  CULTURE, BLOOD (ROUTINE X 2)  CULTURE, BLOOD (ROUTINE X 2)  RESP PANEL BY RT-PCR (FLU A&B, COVID) ARPGX2  C DIFFICILE QUICK SCREEN W PCR REFLEX    GASTROINTESTINAL PANEL BY PCR, STOOL (REPLACES STOOL CULTURE)  LIPASE, BLOOD  LACTIC ACID, PLASMA  LACTIC ACID, PLASMA  MAGNESIUM  ETHANOL  HEMOGLOBIN A1C  CBC  BASIC METABOLIC PANEL    EKG None  Radiology CT Abdomen Pelvis Wo Contrast  Result Date: 08/15/2020 CLINICAL DATA:  Right-sided chest and abdominal pain with diarrhea, initial encounter EXAM: CT CHEST, ABDOMEN AND PELVIS WITHOUT CONTRAST TECHNIQUE: Multidetector CT imaging of the chest, abdomen and pelvis was performed following the standard protocol without IV contrast. COMPARISON:  Chest x-ray from earlier in the same day, 03/21/2020. FINDINGS: CT CHEST FINDINGS Cardiovascular: Limited due to lack of IV contrast. Atherosclerotic calcifications are noted. No cardiac enlargement is seen. No significant coronary calcifications are noted. Pulmonary artery shows no significant enlargement. Mediastinum/Nodes: Thoracic inlet is within normal limits. Scattered small hilar and mediastinal lymph nodes are noted likely reactive in nature. No sizable adenopathy is seen. The esophagus as visualized is within normal limits. Lungs/Pleura: Lungs are well aerated bilaterally. No focal infiltrate or sizable effusion is seen. A few  calcified granulomas are noted. Musculoskeletal: Chronic compression fractures of T10 and T12 are seen. No rib abnormality is noted. CT ABDOMEN PELVIS FINDINGS Hepatobiliary: No focal liver abnormality is seen. No biliary ductal dilatation is seen. The gallbladder is not well visualized. Pancreas: Diffuse calcifications are noted consistent with chronic pancreatitis. Spleen: Normal in size without focal abnormality. Adrenals/Urinary Tract: Adrenal glands are within normal limits. Kidneys demonstrate scattered small nonobstructing stones in the lower poles bilaterally. No ureteral dilatation is seen. The bladder is partially distended but obscured by scatter artifact Stomach/Bowel: Mild diverticular change of the colon is noted. The colon is predominately decompressed. No small bowel obstructive changes are seen. Stomach is decompressed. Vascular/Lymphatic: Aortic atherosclerosis. No enlarged abdominal or pelvic lymph nodes. Reproductive: Uterus is diffusely calcified similar to that seen on prior exam. Other: Mild ascites is noted somewhat less than that seen on the prior exam. No definitive hernia is seen. Mild changes of anasarca are again noted. Musculoskeletal: Right hip prosthesis is noted. L2 chronic compression deformity is seen. Degenerative changes of lumbar spine are noted. IMPRESSION: CT of the chest: Changes of prior granulomatous disease. CT of the abdomen and pelvis: Mild ascites stable from the prior exam. Some mild changes of anasarca are noted as well. Diverticulosis without diverticulitis. Chronic changes similar to that noted on the prior exam. Aortic Atherosclerosis (ICD10-I70.0). Electronically Signed   By: Inez Catalina M.D.   On: 08/15/2020 16:18   CT Head Wo Contrast  Result Date: 08/15/2020 CLINICAL DATA:  65 year old female with altered mental status. EXAM: CT HEAD WITHOUT CONTRAST TECHNIQUE: Contiguous axial images were obtained from the base of the skull through the vertex without  intravenous contrast. COMPARISON:  Head CT dated 07/31/2020. FINDINGS: Brain: Mild age-related atrophy and chronic microvascular ischemic changes. There is no acute intracranial hemorrhage. No mass effect or midline shift no extra-axial fluid collection. Vascular: No hyperdense vessel or unexpected calcification. Skull: Normal. Negative for fracture or focal lesion. Sinuses/Orbits: No acute finding. Other: None IMPRESSION: 1. No acute intracranial pathology. 2. Mild age-related atrophy and chronic microvascular ischemic changes. Electronically Signed   By: Anner Crete M.D.   On: 08/15/2020 16:13   CT Chest Wo Contrast  Result Date: 08/15/2020 CLINICAL DATA:  Right-sided chest and abdominal pain with diarrhea, initial encounter EXAM: CT CHEST, ABDOMEN AND PELVIS WITHOUT CONTRAST TECHNIQUE: Multidetector CT imaging of the chest, abdomen and pelvis was performed following the standard protocol without IV contrast. COMPARISON:  Chest x-ray from earlier in the same day, 03/21/2020. FINDINGS: CT CHEST FINDINGS Cardiovascular: Limited due to lack of IV contrast. Atherosclerotic calcifications are noted. No cardiac  enlargement is seen. No significant coronary calcifications are noted. Pulmonary artery shows no significant enlargement. Mediastinum/Nodes: Thoracic inlet is within normal limits. Scattered small hilar and mediastinal lymph nodes are noted likely reactive in nature. No sizable adenopathy is seen. The esophagus as visualized is within normal limits. Lungs/Pleura: Lungs are well aerated bilaterally. No focal infiltrate or sizable effusion is seen. A few calcified granulomas are noted. Musculoskeletal: Chronic compression fractures of T10 and T12 are seen. No rib abnormality is noted. CT ABDOMEN PELVIS FINDINGS Hepatobiliary: No focal liver abnormality is seen. No biliary ductal dilatation is seen. The gallbladder is not well visualized. Pancreas: Diffuse calcifications are noted consistent with chronic  pancreatitis. Spleen: Normal in size without focal abnormality. Adrenals/Urinary Tract: Adrenal glands are within normal limits. Kidneys demonstrate scattered small nonobstructing stones in the lower poles bilaterally. No ureteral dilatation is seen. The bladder is partially distended but obscured by scatter artifact Stomach/Bowel: Mild diverticular change of the colon is noted. The colon is predominately decompressed. No small bowel obstructive changes are seen. Stomach is decompressed. Vascular/Lymphatic: Aortic atherosclerosis. No enlarged abdominal or pelvic lymph nodes. Reproductive: Uterus is diffusely calcified similar to that seen on prior exam. Other: Mild ascites is noted somewhat less than that seen on the prior exam. No definitive hernia is seen. Mild changes of anasarca are again noted. Musculoskeletal: Right hip prosthesis is noted. L2 chronic compression deformity is seen. Degenerative changes of lumbar spine are noted. IMPRESSION: CT of the chest: Changes of prior granulomatous disease. CT of the abdomen and pelvis: Mild ascites stable from the prior exam. Some mild changes of anasarca are noted as well. Diverticulosis without diverticulitis. Chronic changes similar to that noted on the prior exam. Aortic Atherosclerosis (ICD10-I70.0). Electronically Signed   By: Inez Catalina M.D.   On: 08/15/2020 16:18   DG Chest Portable 1 View  Result Date: 08/15/2020 CLINICAL DATA:  Abdominal pain and diarrhea EXAM: PORTABLE CHEST 1 VIEW COMPARISON:  07/17/2020 FINDINGS: Mild elevation right hemidiaphragm with mild right lower lobe atelectasis unchanged. Mild left lower lobe atelectasis has developed since the prior study Heart size upper normal. Negative for heart failure. No significant pleural effusion. IMPRESSION: Mild bibasilar atelectasis. Electronically Signed   By: Franchot Gallo M.D.   On: 08/15/2020 14:38    Procedures Procedures   Medications Ordered in ED Medications                                               lactated ringers bolus 250 mL (0 mLs Intravenous Stopped 08/15/20 1518)    ED Course  I have reviewed the triage vital signs and the nursing notes.  Pertinent labs & imaging results that were available during my care of the patient were reviewed by me and considered in my medical decision making (see chart for details).  Clinical Course as of 08/16/20 0036  Thu Aug 15, 2020  1343 I spoke with nephrology who will dialyze patient tomorrow.  [EH]  1354 Lab was able to draw blood.  Canceled ABG, ordered VBG.  [EH]    Clinical Course User Index [EH] Ollen Gross   MDM Rules/Calculators/A&P                          Patient is a 65 year old woman who presents today for evaluation of altered mental status  and diarrhea.  She missed her last 2 dialysis sessions.  Patient is altered on exam. She is not hypotensive.  She is given to 50 cc of IV fluid given that she appears dry despite missing 2 sessions of dialysis, after which her mental status improved slightly however she remained very tired appearing.  Blood cultures were obtained.  Her labs show anemia with a hemoglobin of 10, white count is not elevated.  CMP has multiple electrolyte derangements, however despite that her potassium is surprisingly normal at 4.1.  She is hyponatremic with a sodium of 126, her chloride is low at 94, CO2 is low at 14 with a glucose of 299.  Aten is elevated at 6.16 with a BUN of 70.  She also has mild transaminitis and elevated alk phos.  She has an anion gap.  Lactic acid is not elevated, magnesium is normal.  Given that she has had diarrhea C. difficile screen and GI panel by PCR stool are both ordered. Given her altered mental status and slightly abnormal chest x-ray CT scans were obtained of the chest, abdomen, pelvis and head.  These did not show acute abnormalities or cause for her symptoms today.  Patient will require admission. I spoke with on-call  nephrology who will dialyze patient tomorrow.  I feel this is reasonable and she does not require emergent dialysis tonight given that she does not clinically appear fluid overloaded and is not significantly hyperkalemic. I spoke with hospitalist who will see patient for admission.  The patient appears reasonably stabilized for admission considering the current resources, flow, and capabilities available in the ED at this time, and I doubt any other [facility] requiring further screening and/or treatment in the ED prior to admission assuming timely admission and bed placement.  Note: Portions of this report may have been transcribed using voice recognition software. Every effort was made to ensure accuracy; however, inadvertent computerized transcription errors may be present  Final Clinical Impression(s) / ED Diagnoses Final diagnoses:  None    Rx / DC Orders ED Discharge Orders     None        Lorin Glass, PA-C 08/16/20 0040    Margette Fast, MD 08/16/20 [phone]

## 2020-08-15 NOTE — ED Triage Notes (Signed)
Pt to er room number 6 via ems, per ems pt is here for abd pain and diarrhea, states that pt get sat tues and Thursday dialysis, states that she last dialyzed on Saturday.  Pt has had bm on ems stretched, cleaned pt and changed her into a gown.

## 2020-08-15 NOTE — ED Notes (Signed)
Pt in bed, difficulty obtaining iv access, pt has  L arm restriction, veins are small and flat due to probable dehydration.  PA at bedside and aware of difficulty obtaining blood and iv access.  Number 24 gauge iv placed R hand on 4th attempt.

## 2020-08-15 NOTE — H&P (Addendum)
History and Physical    Erica [name] [id number] DOB: 13-Nov-1955 DOA: 08/15/2020  PCP: [name], NP   Patient coming from: Home  I have personally briefly reviewed patient's old medical records in [place]  Chief Complaint: Vomiting, Diarrhea  HPI: Erica [name] is a 65 y.o. female with medical history significant for ESRD- T Th sat, diastolic CHF, , HTN, diabetes mellitus, alcoholic pancreatitis. On my evaluation patient is awake alert and oriented x 4 and able to give me a history.  Patient presented to the ED with complaints of vomiting, epigastric abdominal pain and diarrhea, of 2 days duration.  She reports vomitus is clear, and she has had 3 episodes today.  She reports multiple loose stools, and reports she is unable to control her bowel movements.  Since last HD was Saturday-5 days ago.  She did not go because she was feeling ill.  Denies recent antibiotic prescription. Patient also reports she fell while she was using her walker about a week ago.  Since then she has had problems with vision in her left eye.  Patient reports she makes a little amount of urine.  ED Course:  Noted to be somnolent, but this improved after hydration.  Blood pressure systolic [id number] to [id number].  Temperature 97.7.  WBC 8.3.  Sodium 126.  Lactic acid 1.8. Head Ct without acute abnormality.  Chest and abdominal CT shows changes of prior granulomatous disease, and mild stable ascites. EDP talked to nephrologist, patient to be dialyzed in the morning.  Review of Systems: As per HPI all other systems reviewed and negative.  Past Medical History:  Diagnosis Date   Alcohol-induced pancreatitis    Chronic diarrhea    Depression    Diabetes mellitus    fasting blood sugar 110-120s   Dialysis patient ([place])    Diastolic CHF ([place])    DKA (diabetic ketoacidoses)    Gastroparesis    GERD (gastroesophageal reflux disease)    Heart murmur    History of kidney stones    Hyperlipidemia     Hypertension    Hypokalemia    Muscle spasm    Neuropathic pain    Neuropathy    Hx: of   Pyelonephritis    Seizures ([place])    Vitamin B12 deficiency    Vitamin D deficiency     Past Surgical History:  Procedure Laterality Date   AV FISTULA PLACEMENT Left 12/15/2017   Procedure: INSERTION OF ARTERIOVENOUS (AV) GORE-TEX GRAFT ARM;  Surgeon: Rosetta Posner, MD;  Location: [facility];  Service: Vascular;  Laterality: Left;   CATARACT EXTRACTION W/PHACO Right 03/14/2015   Procedure: CATARACT EXTRACTION PHACO AND INTRAOCULAR LENS PLACEMENT ([place]);  Surgeon: Baruch Goldmann, MD;  Location: [facility];  Service: Ophthalmology;  Laterality: Right;  CDE:11.13   CATARACT EXTRACTION W/PHACO Left 04/11/2015   Procedure: CATARACT EXTRACTION PHACO AND INTRAOCULAR LENS PLACEMENT LEFT EYE CDE=9.68;  Surgeon: Baruch Goldmann, MD;  Location: [facility];  Service: Ophthalmology;  Laterality: Left;   COLONOSCOPY  02/24/2010   cystoscopy with ureteral stent  Bilateral 10/07/2017   At [place] CV LINE RIGHT  11/17/2017   IR FLUORO GUIDE CV LINE RIGHT  11/22/2017   IR REMOVAL TUN CV CATH W/O FL  01/26/2018   IR US GUIDE VASC ACCESS RIGHT  11/17/2017   MULTIPLE EXTRACTIONS WITH ALVEOLOPLASTY N/A 06/13/2012   Procedure: MULTIPLE EXTRACION WITH ALVEOLOPLASTY EXTRACT: 18, 19, 20, 21, 22, 24, 25, 27, 28,  29, 30, 31;  Surgeon: Gae Bon, DDS;  Location: [facility];  Service: Oral Surgery;  Laterality: N/A;   ORIF HUMERUS FRACTURE Right 04/20/2019   Procedure: OPEN REDUCTION INTERNAL FIXATION (ORIF) PROXIMAL HUMERUS FRACTURE;  Surgeon: Marchia Bond, MD;  Location: [facility];  Service: Orthopedics;  Laterality: Right;   TUBAL LIGATION     URETERAL STENT PLACEMENT  09/2017     reports that she has never smoked. Her smokeless tobacco use includes snuff. She reports previous alcohol use of about 1.0 standard drink of alcohol per week. She reports that she does not use drugs.  Allergies  Allergen Reactions    Aspirin Palpitations    Listed on [facility] 11/12/18    Family History  Problem Relation Age of Onset   Diabetes Sister    Chronic Renal Failure Neg Hx    Prior to Admission medications   Medication Sig Start Date End Date Taking? Authorizing Provider  acetaminophen (TYLENOL) 325 MG tablet Take 650 mg by mouth every 4 (four) hours as needed for moderate pain.    [provider]  albuterol (VENTOLIN HFA) 108 (90 Base) MCG/ACT inhaler Inhale 2 puffs into the lungs every 4 (four) hours as needed for wheezing or shortness of breath.     [provider]  calcitRIOL (ROCALTROL) 0.25 MCG capsule Take 1 capsule (0.25 mcg total) by mouth Every Tuesday,Thursday,and Saturday with dialysis. 12/18/17   Love, Ivan Anchors, PA-C  carboxymethylcellulose (ARTIFICIAL TEARS) 1 % ophthalmic solution Place 1 drop into both eyes daily as needed (dry eyes). Patient not taking: Reported on 06/08/2020    [provider]  carvedilol (COREG) 6.25 MG tablet Take 1 tablet (6.25 mg total) by mouth 2 (two) times daily. 11/15/18   Kinnie Feil, MD  cinacalcet (SENSIPAR) 30 MG tablet Take 30 mg by mouth every evening.    [provider]  Emollient (CETAPHIL) cream Apply 1 application topically every 12 (twelve) hours as needed (dry skin). Patient not taking: Reported on 06/08/2020    [provider]  ferric citrate (AURYXIA) 1 GM 210 MG(Fe) tablet Take 2 tablets (420 mg total) by mouth 3 (three) times daily with meals. 11/15/18   Kinnie Feil, MD  folic acid (FOLVITE) 1 MG tablet Take 1 mg by mouth every morning.    [provider]  hydrALAZINE (APRESOLINE) 25 MG tablet Take 1 tablet (25 mg total) by mouth every 8 (eight) hours. 11/19/19   Oswald Hillock, MD  HYDROcodone-acetaminophen (NORCO/VICODIN) 5-325 MG tablet Take 1 tablet by mouth every 4 (four) hours as needed for moderate pain or severe pain. 06/10/20   Manuella Ghazi, Pratik D, DO  insulin lispro (HUMALOG) 100 UNIT/ML injection  Inject 2-10 Units into the skin 4 (four) times daily. Per sliding scale: CBG 151-200 2 units, 201-250 4 units, 251-300 6 units, 301-350 8 units, 351-400 10 units,    [provider]  levETIRAcetam (KEPPRA) 1000 MG tablet Take 1,000 mg by mouth every morning.    [provider]  loperamide (IMODIUM) 2 MG capsule Take 4 mg by mouth daily as needed for diarrhea or loose stools.    [provider]  losartan (COZAAR) 25 MG tablet Take 1 tablet (25 mg total) by mouth daily. 11/20/19   Oswald Hillock, MD  metoCLOPramide (REGLAN) 5 MG tablet Take 1 tablet (5 mg total) by mouth every 6 (six) hours as needed for nausea, vomiting or refractory nausea / vomiting. 11/19/19   Oswald Hillock, MD  NIFEdipine (ADALAT CC) 60 MG 24 hr tablet Take 1 tablet (60 mg total) by mouth every morning. 11/20/19   Oswald Hillock, MD  NIFEdipine (PROCARDIA XL/NIFEDICAL XL) 60 MG 24 hr tablet Take 60 mg by mouth daily. 06/14/20   [provider]  NOVOLOG FLEXPEN 100 UNIT/ML FlexPen Inject into the skin. 07/16/20   [provider]  oxyCODONE-acetaminophen (PERCOCET) 5-325 MG tablet Take 1 tablet by mouth every 6 (six) hours as needed. 07/24/20   Milton Ferguson, MD  pantoprazole (PROTONIX) 40 MG tablet Take 1 tablet (40 mg total) by mouth daily. 11/19/19 01/18/20  Oswald Hillock, MD  polyethylene glycol (MIRALAX) 17 g packet Take 17 g by mouth daily. Patient not taking: Reported on 06/08/2020 08/10/19   Tacy Learn, PA-C  promethazine (PHENERGAN) 25 MG tablet Take 25-50 mg by mouth every 8 (eight) hours as needed. 07/16/20   [provider]  rOPINIRole (REQUIP) 1 MG tablet Take 1 mg by mouth 3 (three) times daily.    [provider]  sertraline (ZOLOFT) 50 MG tablet Take 1 tablet by mouth daily. 07/16/20   [provider]  Vitamin D, Ergocalciferol, (DRISDOL) 1.25 MG (50000 UT) CAPS capsule Take 50,000 Units by mouth every Friday.    [provider]    Physical  Exam: Vitals:   08/15/20 1313 08/15/20 1330 08/15/20 1400 08/15/20 1518  BP:  (!) 192/81 (!) 185/74 (!) 186/82  Pulse:  67 74 77  Resp:  '14 19 16  '$ Temp:    97.7 F (36.5 C)  TempSrc:    Oral  SpO2:  98% 100% 98%  Weight: 42.2 kg     Height: 5' (1.524 m)       Constitutional: NAD, calm, comfortable Vitals:   08/15/20 1313 08/15/20 1330 08/15/20 1400 08/15/20 1518  BP:  (!) 192/81 (!) 185/74 (!) 186/82  Pulse:  67 74 77  Resp:  '14 19 16  '$ Temp:    97.7 F (36.5 C)  TempSrc:    Oral  SpO2:  98% 100% 98%  Weight: 42.2 kg     Height: 5' (1.524 m)      Eyes: PERRL, lids and conjunctivae normal ENMT: Mucous membranes are dry, with dark brown coating on tongue, and reports this is normal from her the ' tobacco snuff" she takes.  Neck: normal, supple, no masses, no thyromegaly Respiratory: clear to auscultation bilaterally, no wheezing, no crackles. Normal respiratory effort. No accessory muscle use.  Cardiovascular: Regular rate and rhythm, no murmurs / rubs / gallops. No extremity edema. 2+ pedal pulses. .  Abdomen: no tenderness, no masses palpated. No hepatosplenomegaly. Bowel sounds positive.  Musculoskeletal: no clubbing / cyanosis. No joint deformity upper and lower extremities. Good ROM, no contractures. Normal muscle tone.  Skin: no rashes, lesions, ulcers. No induration Neurologic: Appears to have peripheral visual defects involving lateral side of the left eye, she is unable to finger count currently on the right side or see objects clearly.  Speech clear and fluent.  5/5 strength in all extremities sensation intact.  No facial asymmetry. Psychiatric: Normal judgment and insight. Alert and oriented x 3. Normal mood.   Labs on Admission: I have personally reviewed following labs and imaging studies  CBC: Recent Labs  Lab 08/15/20 1355  WBC 8.3  NEUTROABS 6.5  HGB 10.0*  HCT 30.6*  MCV 108.5*  PLT [id number]   Basic Metabolic Panel: Recent Labs  Lab 08/15/20 1355  08/15/20 1357  NA 126*  --  K 4.1  --   CL 94*  --   CO2 14*  --   GLUCOSE 229*  --   BUN 70*  --   CREATININE 6.17*  --   CALCIUM 8.1*  --   MG  --  2.2   GFR: Estimated Creatinine Clearance: 6.1 mL/min (A) (by C-G formula based on SCr of 6.17 mg/dL (H)). Liver Function Tests: Recent Labs  Lab 08/15/20 1355  AST 42*  ALT 65*  ALKPHOS 473*  BILITOT 0.7  PROT 7.0  ALBUMIN 3.3*   Recent Labs  Lab 08/15/20 1355  LIPASE 16   Recent Labs  Lab 08/15/20 1342  AMMONIA 45*   Radiological Exams on Admission: CT Abdomen Pelvis Wo Contrast  Result Date: 08/15/2020 CLINICAL DATA:  Right-sided chest and abdominal pain with diarrhea, initial encounter EXAM: CT CHEST, ABDOMEN AND PELVIS WITHOUT CONTRAST TECHNIQUE: Multidetector CT imaging of the chest, abdomen and pelvis was performed following the standard protocol without IV contrast. COMPARISON:  Chest x-ray from earlier in the same day, 03/21/2020. FINDINGS: CT CHEST FINDINGS Cardiovascular: Limited due to lack of IV contrast. Atherosclerotic calcifications are noted. No cardiac enlargement is seen. No significant coronary calcifications are noted. Pulmonary artery shows no significant enlargement. Mediastinum/Nodes: Thoracic inlet is within normal limits. Scattered small hilar and mediastinal lymph nodes are noted likely reactive in nature. No sizable adenopathy is seen. The esophagus as visualized is within normal limits. Lungs/Pleura: Lungs are well aerated bilaterally. No focal infiltrate or sizable effusion is seen. A few calcified granulomas are noted. Musculoskeletal: Chronic compression fractures of T10 and T12 are seen. No rib abnormality is noted. CT ABDOMEN PELVIS FINDINGS Hepatobiliary: No focal liver abnormality is seen. No biliary ductal dilatation is seen. The gallbladder is not well visualized. Pancreas: Diffuse calcifications are noted consistent with chronic pancreatitis. Spleen: Normal in size without focal abnormality.  Adrenals/Urinary Tract: Adrenal glands are within normal limits. Kidneys demonstrate scattered small nonobstructing stones in the lower poles bilaterally. No ureteral dilatation is seen. The bladder is partially distended but obscured by scatter artifact Stomach/Bowel: Mild diverticular change of the colon is noted. The colon is predominately decompressed. No small bowel obstructive changes are seen. Stomach is decompressed. Vascular/Lymphatic: Aortic atherosclerosis. No enlarged abdominal or pelvic lymph nodes. Reproductive: Uterus is diffusely calcified similar to that seen on prior exam. Other: Mild ascites is noted somewhat less than that seen on the prior exam. No definitive hernia is seen. Mild changes of anasarca are again noted. Musculoskeletal: Right hip prosthesis is noted. L2 chronic compression deformity is seen. Degenerative changes of lumbar spine are noted. IMPRESSION: CT of the chest: Changes of prior granulomatous disease. CT of the abdomen and pelvis: Mild ascites stable from the prior exam. Some mild changes of anasarca are noted as well. Diverticulosis without diverticulitis. Chronic changes similar to that noted on the prior exam. Aortic Atherosclerosis (ICD10-I70.0). Electronically Signed   By: Inez Catalina M.D.   On: 08/15/2020 16:18   CT Head Wo Contrast  Result Date: 08/15/2020 CLINICAL DATA:  65 year old female with altered mental status. EXAM: CT HEAD WITHOUT CONTRAST TECHNIQUE: Contiguous axial images were obtained from the base of the skull through the vertex without intravenous contrast. COMPARISON:  Head CT dated 07/31/2020. FINDINGS: Brain: Mild age-related atrophy and chronic microvascular ischemic changes. There is no acute intracranial hemorrhage. No mass effect or midline shift no extra-axial fluid collection. Vascular: No hyperdense vessel or unexpected calcification. Skull: Normal. Negative for fracture or focal lesion. Sinuses/Orbits: No acute  finding. Other: None  IMPRESSION: 1. No acute intracranial pathology. 2. Mild age-related atrophy and chronic microvascular ischemic changes. Electronically Signed   By: Anner Crete M.D.   On: 08/15/2020 16:13   CT Chest Wo Contrast  Result Date: 08/15/2020 CLINICAL DATA:  Right-sided chest and abdominal pain with diarrhea, initial encounter EXAM: CT CHEST, ABDOMEN AND PELVIS WITHOUT CONTRAST TECHNIQUE: Multidetector CT imaging of the chest, abdomen and pelvis was performed following the standard protocol without IV contrast. COMPARISON:  Chest x-ray from earlier in the same day, 03/21/2020. FINDINGS: CT CHEST FINDINGS Cardiovascular: Limited due to lack of IV contrast. Atherosclerotic calcifications are noted. No cardiac enlargement is seen. No significant coronary calcifications are noted. Pulmonary artery shows no significant enlargement. Mediastinum/Nodes: Thoracic inlet is within normal limits. Scattered small hilar and mediastinal lymph nodes are noted likely reactive in nature. No sizable adenopathy is seen. The esophagus as visualized is within normal limits. Lungs/Pleura: Lungs are well aerated bilaterally. No focal infiltrate or sizable effusion is seen. A few calcified granulomas are noted. Musculoskeletal: Chronic compression fractures of T10 and T12 are seen. No rib abnormality is noted. CT ABDOMEN PELVIS FINDINGS Hepatobiliary: No focal liver abnormality is seen. No biliary ductal dilatation is seen. The gallbladder is not well visualized. Pancreas: Diffuse calcifications are noted consistent with chronic pancreatitis. Spleen: Normal in size without focal abnormality. Adrenals/Urinary Tract: Adrenal glands are within normal limits. Kidneys demonstrate scattered small nonobstructing stones in the lower poles bilaterally. No ureteral dilatation is seen. The bladder is partially distended but obscured by scatter artifact Stomach/Bowel: Mild diverticular change of the colon is noted. The colon is predominately  decompressed. No small bowel obstructive changes are seen. Stomach is decompressed. Vascular/Lymphatic: Aortic atherosclerosis. No enlarged abdominal or pelvic lymph nodes. Reproductive: Uterus is diffusely calcified similar to that seen on prior exam. Other: Mild ascites is noted somewhat less than that seen on the prior exam. No definitive hernia is seen. Mild changes of anasarca are again noted. Musculoskeletal: Right hip prosthesis is noted. L2 chronic compression deformity is seen. Degenerative changes of lumbar spine are noted. IMPRESSION: CT of the chest: Changes of prior granulomatous disease. CT of the abdomen and pelvis: Mild ascites stable from the prior exam. Some mild changes of anasarca are noted as well. Diverticulosis without diverticulitis. Chronic changes similar to that noted on the prior exam. Aortic Atherosclerosis (ICD10-I70.0). Electronically Signed   By: Inez Catalina M.D.   On: 08/15/2020 16:18   DG Chest Portable 1 View  Result Date: 08/15/2020 CLINICAL DATA:  Abdominal pain and diarrhea EXAM: PORTABLE CHEST 1 VIEW COMPARISON:  07/17/2020 FINDINGS: Mild elevation right hemidiaphragm with mild right lower lobe atelectasis unchanged. Mild left lower lobe atelectasis has developed since the prior study Heart size upper normal. Negative for heart failure. No significant pleural effusion. IMPRESSION: Mild bibasilar atelectasis. Electronically Signed   By: Franchot Gallo M.D.   On: 08/15/2020 14:38    EKG: Independently reviewed.  Sinus rhythm rate 77.  QTc 510.  Baseline wander.  No significant change from prior.  Assessment/Plan Principal Problem:   Metabolic encephalopathy Active Problems:   H/O chronic pancreatitis   Dehydration   Chronic diastolic congestive heart failure (HCC)   Essential hypertension   ESRD (end stage renal disease) (HCC)   Acute metabolic encephalopathy-somnolent in the ED-improving, presenting with abdominal pain vomiting diarrhea.  She appears  dehydrated.  Altered mental status likely secondary to dehydration and 2 missed HD sessions.  Head CT without acute abnormality.  She reports changes to her peripheral left vision since fall 1 week ago..  No other focal neurologic deficits appreciated. -Hydrate -HD in a.m. -Obtain brain MRI without contrast -250 mill bolus given.  Continue N/s 50cc/hr x 15hrs  Gastroenteritis-likely etiology for vomiting and diarrhea versus related to chronic pancreatitis.  Denies recent antibiotic prescription. -Hydrate -Stool C. Difficile, GI pathogen panel ordered - Bowel rest with clear liq diet, advance as tolerated  Hyponatremia sodium 126, likely from euvolemia, dehydration. -Gentle hydration  Prolonged QTC- 510.  Potassium 4.1.  Medications include sertraline. -Check magnesium  ESRD-schedule Tuesday Thursday Saturday.  Last HD 5 days ago.  Does not appear volume overloaded at this time, blood pressure elevated up to 190s.  Potassium 4.1. - EDP talked to nephrology, plans for HD in a.m.  History of chronic pancreatitis-May be contributing to patient's symptoms.   Lipase stable at 16, CT unremarkable.  Chronic diastolic CHF-stable and compensated.  Requiring IV fluids for now.  Last echo 2019 EF 60 to 65%, G1DD. - Per HD  DM-random glucose 229. - SSI- [place] - Hgba1c  Chronic liver enzyme elevation- CT unremarkable for liver pathology.  HTN- elevated bp - Resume hydralazine, carvedilol, lorsatan, Nifedipine - PRN hydrala '10mg'$   Seizures - Resume keppra  DVT prophylaxis: Heparin Code Status: Full code) Family Communication: None at bedside. Disposition Plan:  ~ 1 - 2 days Consults called: Nephrology Admission status: Obs, tele   Bethena Roys MD [facility]  08/15/2020, 8:08 PM

## 2020-08-15 NOTE — ED Notes (Signed)
Pt in bed with eyes closed, pt arouses easily to verbal stim, pt states that she is dry and has not had a bm and doesn't need to be cleaned at this time.  Pt remains on monitor, resps even and unlabored

## 2020-08-16 ENCOUNTER — Observation Stay (HOSPITAL_COMMUNITY): Payer: Medicare Other

## 2020-08-16 DIAGNOSIS — F32A Depression, unspecified: Secondary | ICD-10-CM | POA: Diagnosis present

## 2020-08-16 DIAGNOSIS — K760 Fatty (change of) liver, not elsewhere classified: Secondary | ICD-10-CM | POA: Diagnosis present

## 2020-08-16 DIAGNOSIS — E1165 Type 2 diabetes mellitus with hyperglycemia: Secondary | ICD-10-CM | POA: Diagnosis present

## 2020-08-16 DIAGNOSIS — I5032 Chronic diastolic (congestive) heart failure: Secondary | ICD-10-CM

## 2020-08-16 DIAGNOSIS — R197 Diarrhea, unspecified: Secondary | ICD-10-CM | POA: Diagnosis not present

## 2020-08-16 DIAGNOSIS — E785 Hyperlipidemia, unspecified: Secondary | ICD-10-CM | POA: Diagnosis present

## 2020-08-16 DIAGNOSIS — I2510 Atherosclerotic heart disease of native coronary artery without angina pectoris: Secondary | ICD-10-CM | POA: Diagnosis present

## 2020-08-16 DIAGNOSIS — Z20822 Contact with and (suspected) exposure to covid-19: Secondary | ICD-10-CM | POA: Diagnosis present

## 2020-08-16 DIAGNOSIS — E871 Hypo-osmolality and hyponatremia: Secondary | ICD-10-CM | POA: Diagnosis present

## 2020-08-16 DIAGNOSIS — E1122 Type 2 diabetes mellitus with diabetic chronic kidney disease: Secondary | ICD-10-CM | POA: Diagnosis present

## 2020-08-16 DIAGNOSIS — R4182 Altered mental status, unspecified: Secondary | ICD-10-CM | POA: Diagnosis not present

## 2020-08-16 DIAGNOSIS — I10 Essential (primary) hypertension: Secondary | ICD-10-CM

## 2020-08-16 DIAGNOSIS — N2581 Secondary hyperparathyroidism of renal origin: Secondary | ICD-10-CM | POA: Diagnosis present

## 2020-08-16 DIAGNOSIS — K3184 Gastroparesis: Secondary | ICD-10-CM | POA: Diagnosis present

## 2020-08-16 DIAGNOSIS — G9341 Metabolic encephalopathy: Secondary | ICD-10-CM

## 2020-08-16 DIAGNOSIS — N186 End stage renal disease: Secondary | ICD-10-CM | POA: Diagnosis present

## 2020-08-16 DIAGNOSIS — E7220 Disorder of urea cycle metabolism, unspecified: Secondary | ICD-10-CM | POA: Diagnosis present

## 2020-08-16 DIAGNOSIS — K861 Other chronic pancreatitis: Secondary | ICD-10-CM | POA: Diagnosis present

## 2020-08-16 DIAGNOSIS — K859 Acute pancreatitis without necrosis or infection, unspecified: Secondary | ICD-10-CM | POA: Diagnosis present

## 2020-08-16 DIAGNOSIS — I132 Hypertensive heart and chronic kidney disease with heart failure and with stage 5 chronic kidney disease, or end stage renal disease: Secondary | ICD-10-CM | POA: Diagnosis present

## 2020-08-16 DIAGNOSIS — K529 Noninfective gastroenteritis and colitis, unspecified: Secondary | ICD-10-CM | POA: Diagnosis present

## 2020-08-16 DIAGNOSIS — G40909 Epilepsy, unspecified, not intractable, without status epilepticus: Secondary | ICD-10-CM | POA: Diagnosis present

## 2020-08-16 DIAGNOSIS — E860 Dehydration: Secondary | ICD-10-CM | POA: Diagnosis present

## 2020-08-16 DIAGNOSIS — R7401 Elevation of levels of liver transaminase levels: Secondary | ICD-10-CM | POA: Diagnosis present

## 2020-08-16 DIAGNOSIS — E1143 Type 2 diabetes mellitus with diabetic autonomic (poly)neuropathy: Secondary | ICD-10-CM | POA: Diagnosis present

## 2020-08-16 DIAGNOSIS — E1140 Type 2 diabetes mellitus with diabetic neuropathy, unspecified: Secondary | ICD-10-CM | POA: Diagnosis present

## 2020-08-16 DIAGNOSIS — Z992 Dependence on renal dialysis: Secondary | ICD-10-CM | POA: Diagnosis not present

## 2020-08-16 LAB — BASIC METABOLIC PANEL
Anion gap: 15 (ref 5–15)
BUN: 73 mg/dL — ABNORMAL HIGH (ref 8–23)
CO2: 19 mmol/L — ABNORMAL LOW (ref 22–32)
Calcium: 8.7 mg/dL — ABNORMAL LOW (ref 8.9–10.3)
Chloride: 95 mmol/L — ABNORMAL LOW (ref 98–111)
Creatinine, Ser: 6.6 mg/dL — ABNORMAL HIGH (ref 0.44–1.00)
GFR, Estimated: 6 mL/min — ABNORMAL LOW (ref >60)
Glucose, Bld: 97 mg/dL (ref 70–99)
Potassium: 4.1 mmol/L (ref 3.5–5.1)
Sodium: 129 mmol/L — ABNORMAL LOW (ref 135–145)

## 2020-08-16 LAB — GASTROINTESTINAL PANEL BY PCR, STOOL (REPLACES STOOL CULTURE)

## 2020-08-16 LAB — CBC
HCT: 28.4 % — ABNORMAL LOW (ref 36.0–46.0)
Hemoglobin: 9.2 g/dL — ABNORMAL LOW (ref 12.0–15.0)
MCH: 35.2 pg — ABNORMAL HIGH (ref 26.0–34.0)
MCHC: 32.4 g/dL (ref 30.0–36.0)
MCV: 108.8 fL — ABNORMAL HIGH (ref 80.0–100.0)
Platelets: 155 10*3/uL (ref 150–400)
RBC: 2.61 MIL/uL — ABNORMAL LOW (ref 3.87–5.11)
RDW: 12.8 % (ref 11.5–15.5)
WBC: 6.7 10*3/uL (ref 4.0–10.5)
nRBC: 0 % (ref 0.0–0.2)

## 2020-08-16 LAB — TSH: TSH: 2.298 u[IU]/mL (ref 0.350–4.500)

## 2020-08-16 LAB — VITAMIN B12: Vitamin B-12: 860 pg/mL (ref 180–914)

## 2020-08-16 LAB — FOLATE: Folate: 54.6 ng/mL (ref >5.9)

## 2020-08-16 LAB — T4, FREE: Free T4: 0.95 ng/dL (ref 0.61–1.12)

## 2020-08-16 LAB — GLUCOSE, CAPILLARY
Glucose-Capillary: 101 mg/dL — ABNORMAL HIGH (ref 70–99)
Glucose-Capillary: 172 mg/dL — ABNORMAL HIGH (ref 70–99)
Glucose-Capillary: 181 mg/dL — ABNORMAL HIGH (ref 70–99)

## 2020-08-16 MED ORDER — SODIUM CHLORIDE 0.9 % IV SOLN: 100.0000 mL | INTRAVENOUS | Status: DC | PRN

## 2020-08-16 MED ORDER — DARBEPOETIN ALFA 25 MCG/0.42ML IJ SOSY
25.0000 ug | PREFILLED_SYRINGE | Freq: Once | INTRAMUSCULAR | Status: AC
  Administered 2020-08-16: 25 ug via SUBCUTANEOUS
  Filled 2020-08-16: qty 0.42

## 2020-08-16 MED ORDER — PENTAFLUOROPROP-TETRAFLUOROETH EX AERO: 1.0000 "application " | INHALATION_SPRAY | CUTANEOUS | Status: DC | PRN

## 2020-08-16 MED ORDER — LIDOCAINE HCL (PF) 1 % IJ SOLN: 5.0000 mL | INTRAMUSCULAR | Status: DC | PRN

## 2020-08-16 MED ORDER — HEPARIN SODIUM (PORCINE) 1000 UNIT/ML IJ SOLN
INTRAMUSCULAR | Status: AC
  Administered 2020-08-16: 800 [IU] via INTRAVENOUS_CENTRAL
  Filled 2020-08-16: qty 1

## 2020-08-16 MED ORDER — HEPARIN SODIUM (PORCINE) 1000 UNIT/ML DIALYSIS: 20.0000 [IU]/kg | INTRAMUSCULAR | Status: DC | PRN

## 2020-08-16 MED ORDER — LIDOCAINE-PRILOCAINE 2.5-2.5 % EX CREA: 1.0000 "application " | TOPICAL_CREAM | CUTANEOUS | Status: DC | PRN

## 2020-08-16 NOTE — Procedures (Signed)
   HEMODIALYSIS TREATMENT NOTE:  Uneventful 3.75 hour low-heparin HD completed via left upper arm loop AVG.  Goal met: 1 liter removed without interruption in UF.  All blood was returned and hemostasis was achieved in 15 minutes.  No changes from pre-HD assessment.  Rockwell Alexandria, RN

## 2020-08-16 NOTE — Plan of Care (Addendum)
  Problem: Acute Rehab PT Goals(only PT should resolve) Goal: Patient Will Transfer Sit To/From Stand Outcome: Progressing Flowsheets (Taken 08/16/2020 1156) Patient will transfer sit to/from stand: with modified independence Goal: Pt Will Transfer Bed To Chair/Chair To Bed Outcome: Progressing Flowsheets (Taken 08/16/2020 1156) Pt will Transfer Bed to Chair/Chair to Bed:  with supervision  with modified independence Goal: Pt Will Perform Standing Balance Or Pre-Gait Outcome: Progressing Flowsheets (Taken 08/16/2020 1156) Pt will perform standing balance or pre-gait:  1-2 min  with Modified Independent  with bilateral UE support Goal: Pt Will Ambulate Outcome: Progressing Flowsheets (Taken 08/16/2020 1156) Pt will Ambulate:  50 feet  with modified independence  with rolling walker   11:57 AM, 08/16/20 Sinclair Ship SPT   12:04 PM, 08/16/20 Lonell Grandchild, MPT Physical Therapist with [facility] [phone] office [phone] mobile phone

## 2020-08-16 NOTE — TOC Initial Note (Signed)
Transition of Care [facility]) - Initial/Assessment Note    Patient Details  Name: Erica [name] MRN: [id number] Date of Birth: Jan 04, 1956  Transition of Care [facility]) CM/SW Contact:    Boneta Lucks, RN Phone Number: 08/16/2020, 1:27 PM  Clinical Narrative:     Patient admitted in OBS for metabolic encephalopathy.  Patient lives at home with her son. When he is not home, her aunt and cousin is with her. PT is recommending SNF if family can not assist.  Per son, Otilio Jefferson will continue to care for her at home. She is active with home health, he is unable of the name of the company.               Expected Discharge Plan: [place] Barriers to Discharge: Continued Medical Work up  Patient Goals and CMS Choice Patient states their goals for this hospitalization and ongoing recovery are:: to return home. CMS Medicare.gov Compare Post Acute Care list provided to:: Patient Choice offered to / list presented to : Patient  Expected Discharge Plan and Services Expected Discharge Plan: [place]      Prior Living Arrangements/Services   Lives with:: Adult Children Patient language and need for interpreter reviewed:: Yes        Need for Family Participation in Patient Care: Yes (Comment) Care giver support system in place?: Yes (comment)   Criminal Activity/Legal Involvement Pertinent to Current Situation/Hospitalization: No - Comment as needed  Activities of Daily Living Home Assistive Devices/Equipment: Walker (specify type) ADL Screening (condition at time of admission) Patient's cognitive ability adequate to safely complete daily activities?: Yes Is the patient deaf or have difficulty hearing?: No Does the patient have difficulty seeing, even when wearing glasses/contacts?: No Does the patient have difficulty concentrating, remembering, or making decisions?: Yes Patient able to express need for assistance with ADLs?: Yes Does the patient have  difficulty dressing or bathing?: No Independently performs ADLs?: Yes (appropriate for developmental age) Does the patient have difficulty walking or climbing stairs?: Yes Weakness of Legs: Both Weakness of Arms/Hands: None  Permission Sought/Granted      Emotional Assessment     Alcohol / Substance Use: Not Applicable Psych Involvement: No (comment)  Admission diagnosis:  AMS (altered mental status) [R41.82] Patient Active Problem List   Diagnosis Date Noted   Acute metabolic encephalopathy 123456   Diarrhea    Pressure injury of skin 06/09/2020   Sacral fracture ([place]) 06/08/2020   Thrombocytopenia ([place]) 06/08/2020   Hypertensive urgency 06/08/2020   (HFpEF) heart failure with preserved ejection fraction ([place]) 06/08/2020   Fall 06/08/2020   Elevated MCV 06/08/2020   Palliative care by specialist    Generalized weakness 11/14/2019   HCAP (healthcare-associated pneumonia) 11/14/2019   Hyperkalemia 11/13/2019   Hyponatremia 09/27/2019   Closed fracture of right proximal humerus 04/20/2019   Surgery, elective    Seizure ([place]) 11/12/2018   History of hydronephrosis --stents in place 12/08/2017   Stroke ([place]) 11/29/2017   Seizures ([place])    Hydronephrosis    Recurrent UTI    Diabetes mellitus type 2 in nonobese ([place])    ESRD (end stage renal disease) ([place])    Anemia of chronic disease    Chronic diastolic congestive heart failure ([place])    Essential hypertension    PICC (peripherally inserted central catheter) in place    Acute pyelonephritis 11/12/2017   Uncontrolled type 2 diabetes mellitus with hyperglycemia, with long-term current use of insulin ([place]) 11/10/2017  Renal failure 11/08/2017   Seizure disorder ([place]) 11/08/2017   Orthostatic hypotension 07/25/2014   Moderate protein malnutrition ([place]) 09/15/2013   Aspiration pneumonia ([place]) 09/15/2013   Acute respiratory failure requiring reintubation ([place]) 09/11/2013   probable Seizures due to metabolic disorder  [id number]   Lactic acidosis 03/19/2013   Abdominal pain 03/19/2013   Rotavirus infection 10/29/2012   Type II or unspecified type diabetes mellitus without mention of complication, uncontrolled 10/29/2012   Protein-calorie malnutrition, severe ([place]) 10/27/2012   NSTEMI (non-ST elevated myocardial infarction) ([place]) 10/26/2012   Fever, unspecified 10/26/2012   Hypotension [id number]   Metabolic acidosis [id number]   Chronic diarrhea 10/25/2012   Tobacco abuse 10/25/2012   DKA (diabetic ketoacidoses) 09/09/2012   Dehydration 09/09/2012   DKA, type 2 ([place]) 05/20/2012   Abnormal LFTs 05/20/2012   Heart murmur, systolic [id number]   Hypoglycemia [id number]   Metabolic encephalopathy [id number]   Alcohol abuse 07/08/2011   Hypokalemia 07/08/2011   Nausea & vomiting 07/08/2011   H/O chronic pancreatitis 07/08/2011   PCP:  [name], NP Pharmacy:   [facility] [facility], [place] [address] Phone: [phone] Fax: [place], [place] [address] Phone: [phone] Fax: [phone]   Readmission Risk Interventions Readmission Risk Prevention Plan 06/11/2020 11/19/2019 11/15/2019  Transportation Screening Complete Complete -  Medication Review (RN Care Manager) Complete Complete -  PCP or Specialist appointment within 3-5 days of discharge - Complete Complete  HRI or Home Care Consult Complete Complete -  SW Recovery Care/Counseling Consult Complete Complete -  Palliative Care Screening Not Applicable Complete -  Skilled Nursing Facility Complete Complete -  Some recent data might be hidden

## 2020-08-16 NOTE — Progress Notes (Signed)
PROGRESS NOTE  Erica [name] [id number] DOB: Jan 18, 1956 DOA: 08/15/2020 PCP: [name], NP  Brief History:  65 year old female with a history of ESRD (TTS), diabetes mellitus type 2, coronary disease, hypertension, hyperlipidemia, B12 deficiency, seizure disorder presenting with 1 week history of upper abdominal pain with associated nausea, vomiting, and diarrhea.  The patient is a difficult historian.  She has some delayed responses and frequently contradicts historical events during the same conversation.  Nevertheless, the patient has been complaining of the above symptoms which have been worsening over the past 2 days.  She has had decreased oral intake and increasing generalized weakness.  Because of the above symptoms, the patient has missed dialysis.  Her last dialysis was on Saturday, 08/10/2020.  She denies any fevers, chills, chest pain, headache, hematemesis, hematochezia, melena.  She states that eating does make her abdominal pain a little worse.  She has had some dyspnea on exertion but denies any chest pain.  She denies any alcohol use or illicit drug use.  She denies any recent antibiotics.  The patient lives with her son, but she states that he has been feeling fine. In the emergency department, the patient was afebrile hemodynamically stable with oxygen saturation 90-100% on room air.  BMP showed a sodium 126, potassium 4.1, bicarbonate 14, serum creatinine 6.17.  WBC 8.3, hemoglobin 10.0, platelets 159,000.  Assessment/Plan: Acute metabolic encephalopathy -Multifactorial including uremia, dehydration, and possible infectious process -Currently alert and oriented x 2 but with delayed responses -Ammonia 45 -Check [id number] -Folic acid -Follow-up MRI brain -TSH  Intractable nausea, vomiting, diarrhea -C. difficile negative -Stool pathogen panel -Start Protonix twice daily -GI consult if no improvement -CT abd--no bowel obstruction or inflammation;  neg  pancreatic stranding  Hyponatremia -Secondary to ESRD and missed HD  ESRD -Nephrology consulted -Plan for HD 08/16/2020 -Continue calcitriol and Sensipar  Chronic diastolic CHF -Appears clinically euvolemic -Fluid removal by dialysis -08/15/20 CT chest--no infiltrates nor pleural effusion  Transaminasemia -This appears to be chronic and likely due to hepatic steatosis -Monitor clinically -Repeat LFTs  Seizure disorder -Continue Keppra  Diabetes mellitus type 2 -Hemoglobin A1c -NovoLog sliding scale  Essential hypertension -Continue carvedilol, hydralazine, losartan, nifedipine  History of chronic pancreatitis-May be contributing to patient's symptoms.    -Lipase stable at 16 -CT abd--pancreatic calcifications      Status is: Observation  The patient will need to be admitted as she will stay>2 midnights due to intolerant to po intake and continued confusion  Dispo: The patient is from: Home              Anticipated d/c is to: Home              Patient currently is not medically stable to d/c.   Difficult to place patient No        Family Communication:   no Family at bedside  Consultants:  renal  Code Status:  FULL  DVT Prophylaxis:  [place] Heparin    Procedures: As Listed in Progress Note Above  Antibiotics: None    Subjective: Patient continues to be nauseous and does not want to eat.  She complains of upper abd pain.  Denies f/c,cp, sob, vomiting.  Had loose stool last night.  No hematochezia or melena  Objective: Vitals:   08/15/20 1518 08/15/20 2056 08/16/20 0144 08/16/20 0439  BP: (!) 186/82 (!) 192/80 (!) 174/69 (!) 178/77  Pulse: 77 73 72 70  Resp:  $'16 19 18 18  'P$ Temp: 97.7 F (36.5 C) 98.2 F (36.8 C) 98.4 F (36.9 C) 98.3 F (36.8 C)  TempSrc: Oral Oral Oral   SpO2: 98% 100% 100% 100%  Weight:      Height:        Intake/Output Summary (Last 24 hours) at 08/16/2020 0845 Last data filed at 08/16/2020 [id number] Gross per 24 hour  Intake  253.78 ml  Output 0 ml  Net 253.78 ml   Weight change:  Exam:  General:  Pt is alert, follows commands appropriately, not in acute distress; slow/delayed responses HEENT: No icterus, No thrush, No neck mass, [place]/AT Cardiovascular: RRR, S1/S2, no rubs, no gallops Respiratory: bibasilar crackles. No wheeze Abdomen: Soft/+BS, non tender, non distended, no guarding Extremities: No edema, No lymphangitis, No petechiae, No rashes, no synovitis   Data Reviewed: I have personally reviewed following labs and imaging studies Basic Metabolic Panel: Recent Labs  Lab 08/15/20 1355 08/15/20 1357 08/16/20 0621  NA 126*  --  129*  K 4.1  --  4.1  CL 94*  --  95*  CO2 14*  --  19*  GLUCOSE 229*  --  97  BUN 70*  --  73*  CREATININE 6.17*  --  6.60*  CALCIUM 8.1*  --  8.7*  MG  --  2.2  --    Liver Function Tests: Recent Labs  Lab 08/15/20 1355  AST 42*  ALT 65*  ALKPHOS 473*  BILITOT 0.7  PROT 7.0  ALBUMIN 3.3*   Recent Labs  Lab 08/15/20 1355  LIPASE 16   Recent Labs  Lab 08/15/20 1342  AMMONIA 45*   Coagulation Profile: No results for input(s): INR, PROTIME in the last 168 hours. CBC: Recent Labs  Lab 08/15/20 1355 08/16/20 0500  WBC 8.3 6.7  NEUTROABS 6.5  --   HGB 10.0* 9.2*  HCT 30.6* 28.4*  MCV 108.5* 108.8*  PLT 159 155   Cardiac Enzymes: No results for input(s): CKTOTAL, CKMB, CKMBINDEX, TROPONINI in the last 168 hours. BNP: Invalid input(s): POCBNP CBG: Recent Labs  Lab 08/15/20 2134 08/16/20 0758  GLUCAP 211* 101*   HbA1C: No results for input(s): HGBA1C in the last 72 hours. Urine analysis:    Component Value Date/Time   COLORURINE YELLOW 09/27/2019 1310   APPEARANCEUR TURBID (A) 09/27/2019 1310   LABSPEC 1.013 09/27/2019 1310   PHURINE 7.0 09/27/2019 1310   GLUCOSEU 50 (A) 09/27/2019 1310   HGBUR SMALL (A) 09/27/2019 1310   BILIRUBINUR NEGATIVE 09/27/2019 1310   KETONESUR NEGATIVE 09/27/2019 1310   PROTEINUR >=300 (A) 09/27/2019  1310   UROBILINOGEN 0.2 07/25/2014 2309   NITRITE NEGATIVE 09/27/2019 1310   LEUKOCYTESUR MODERATE (A) 09/27/2019 1310   Sepsis Labs: '@LABRCNTIP'$ (procalcitonin:4,lacticidven:4) ) Recent Results (from the past 240 hour(s))  Resp Panel by RT-PCR (Flu A&B, Covid) Nasopharyngeal Swab     Status: None   Collection Time: 08/15/20  1:37 PM   Specimen: Nasopharyngeal Swab; Nasopharyngeal(NP) swabs in vial transport medium  Result Value Ref Range Status   SARS Coronavirus 2 by RT PCR NEGATIVE NEGATIVE Final    Comment: (NOTE) SARS-CoV-2 target nucleic acids are NOT DETECTED.  The SARS-CoV-2 RNA is generally detectable in upper respiratory specimens during the acute phase of infection. The lowest concentration of SARS-CoV-2 viral copies this assay can detect is 138 copies/mL. A negative result does not preclude SARS-Cov-2 infection and should not be used as the sole basis for treatment or other patient management decisions. A negative  result may occur with  improper specimen collection/handling, submission of specimen other than nasopharyngeal swab, presence of viral mutation(s) within the areas targeted by this assay, and inadequate number of viral copies(<138 copies/mL). A negative result must be combined with clinical observations, patient history, and epidemiological information. The expected result is Negative.  Fact Sheet for Patients:  EntrepreneurPulse.com.au  Fact Sheet for Healthcare Providers:  IncredibleEmployment.be  This test is no t yet approved or cleared by the [place] FDA and  has been authorized for detection and/or diagnosis of SARS-CoV-2 by FDA under an Emergency Use Authorization (EUA). This EUA will remain  in effect (meaning this test can be used) for the duration of the COVID-19 declaration under Section 564(b)(1) of the Act, 21 U.S.C.section 360bbb-3(b)(1), unless the authorization is terminated  or revoked sooner.        Influenza A by PCR NEGATIVE NEGATIVE Final   Influenza B by PCR NEGATIVE NEGATIVE Final    Comment: (NOTE) The Xpert Xpress SARS-CoV-2/FLU/RSV plus assay is intended as an aid in the diagnosis of influenza from Nasopharyngeal swab specimens and should not be used as a sole basis for treatment. Nasal washings and aspirates are unacceptable for Xpert Xpress SARS-CoV-2/FLU/RSV testing.  Fact Sheet for Patients: EntrepreneurPulse.com.au  Fact Sheet for Healthcare Providers: IncredibleEmployment.be  This test is not yet approved or cleared by the [place] FDA and has been authorized for detection and/or diagnosis of SARS-CoV-2 by FDA under an Emergency Use Authorization (EUA). This EUA will remain in effect (meaning this test can be used) for the duration of the COVID-19 declaration under Section 564(b)(1) of the Act, 21 U.S.C. section 360bbb-3(b)(1), unless the authorization is terminated or revoked.  Performed at [facility], [address], [place], [place] [address]   Culture, blood (routine x 2)     Status: None (Preliminary result)   Collection Time: 08/15/20  1:56 PM   Specimen: BLOOD RIGHT ARM  Result Value Ref Range Status   Specimen Description   Final    BLOOD RIGHT ARM BOTTLES DRAWN AEROBIC AND ANAEROBIC   Special Requests Blood Culture adequate volume  Final   Culture   Final    NO GROWTH < 12 HOURS Performed at [facility], [address] [place], [place] [address]    Report Status PENDING  Incomplete  Culture, blood (routine x 2)     Status: None (Preliminary result)   Collection Time: 08/15/20  1:57 PM   Specimen: BLOOD RIGHT HAND  Result Value Ref Range Status   Specimen Description   Final    BLOOD RIGHT HAND BOTTLES DRAWN AEROBIC AND ANAEROBIC   Special Requests Blood Culture adequate volume  Final   Culture   Final    NO GROWTH < 12 HOURS Performed at [facility], [address] [place], [place]  [address]    Report Status PENDING  Incomplete  C Difficile Quick Screen w PCR reflex     Status: None   Collection Time: 08/15/20  8:40 PM   Specimen: Stool  Result Value Ref Range Status   C Diff antigen NEGATIVE NEGATIVE Final   C Diff toxin NEGATIVE NEGATIVE Final   C Diff interpretation No C. difficile detected.  Final    Comment: Performed at [facility], [address], [place], [place] [address]     Scheduled Meds:  [START ON 08/17/2020] calcitRIOL  0.25 mcg Oral Q T,Th,Sa-HD   carvedilol  6.25 mg Oral BID   Chlorhexidine Gluconate Cloth  6  each Topical Q0600   cinacalcet  30 mg Oral QPM   heparin  5,000 Units Subcutaneous Q8H   hydrALAZINE  25 mg Oral Q8H   insulin aspart  0-5 Units Subcutaneous QHS   insulin aspart  0-6 Units Subcutaneous TID WC   levETIRAcetam  1,000 mg Oral q morning   LORazepam  0.5 mg Oral Once   losartan  25 mg Oral Daily   NIFEdipine  60 mg Oral q morning   pantoprazole  40 mg Oral Daily   Continuous Infusions:  sodium chloride 50 mL/hr at 08/15/20 2252    Procedures/Studies: CT Abdomen Pelvis Wo Contrast  Result Date: 08/15/2020 CLINICAL DATA:  Right-sided chest and abdominal pain with diarrhea, initial encounter EXAM: CT CHEST, ABDOMEN AND PELVIS WITHOUT CONTRAST TECHNIQUE: Multidetector CT imaging of the chest, abdomen and pelvis was performed following the standard protocol without IV contrast. COMPARISON:  Chest x-ray from earlier in the same day, 03/21/2020. FINDINGS: CT CHEST FINDINGS Cardiovascular: Limited due to lack of IV contrast. Atherosclerotic calcifications are noted. No cardiac enlargement is seen. No significant coronary calcifications are noted. Pulmonary artery shows no significant enlargement. Mediastinum/Nodes: Thoracic inlet is within normal limits. Scattered small hilar and mediastinal lymph nodes are noted likely reactive in nature. No sizable adenopathy is seen. The esophagus as visualized is within normal limits. Lungs/Pleura:  Lungs are well aerated bilaterally. No focal infiltrate or sizable effusion is seen. A few calcified granulomas are noted. Musculoskeletal: Chronic compression fractures of T10 and T12 are seen. No rib abnormality is noted. CT ABDOMEN PELVIS FINDINGS Hepatobiliary: No focal liver abnormality is seen. No biliary ductal dilatation is seen. The gallbladder is not well visualized. Pancreas: Diffuse calcifications are noted consistent with chronic pancreatitis. Spleen: Normal in size without focal abnormality. Adrenals/Urinary Tract: Adrenal glands are within normal limits. Kidneys demonstrate scattered small nonobstructing stones in the lower poles bilaterally. No ureteral dilatation is seen. The bladder is partially distended but obscured by scatter artifact Stomach/Bowel: Mild diverticular change of the colon is noted. The colon is predominately decompressed. No small bowel obstructive changes are seen. Stomach is decompressed. Vascular/Lymphatic: Aortic atherosclerosis. No enlarged abdominal or pelvic lymph nodes. Reproductive: Uterus is diffusely calcified similar to that seen on prior exam. Other: Mild ascites is noted somewhat less than that seen on the prior exam. No definitive hernia is seen. Mild changes of anasarca are again noted. Musculoskeletal: Right hip prosthesis is noted. L2 chronic compression deformity is seen. Degenerative changes of lumbar spine are noted. IMPRESSION: CT of the chest: Changes of prior granulomatous disease. CT of the abdomen and pelvis: Mild ascites stable from the prior exam. Some mild changes of anasarca are noted as well. Diverticulosis without diverticulitis. Chronic changes similar to that noted on the prior exam. Aortic Atherosclerosis (ICD10-I70.0). Electronically Signed   By: Inez Catalina M.D.   On: 08/15/2020 16:18   DG Shoulder Right  Result Date: 07/31/2020 CLINICAL DATA:  Fall EXAM: RIGHT SHOULDER - 2+ VIEW COMPARISON:  None. FINDINGS: Postoperative changes in the  proximal right humerus with plate and screw fixation device. No acute fracture, subluxation or dislocation. Mild degenerative changes in the [facility] joint. Glenohumeral joint is maintained. IMPRESSION: No acute bony abnormality. Electronically Signed   By: Rolm Baptise M.D.   On: 07/31/2020 20:50   CT Head Wo Contrast  Result Date: 08/15/2020 CLINICAL DATA:  65 year old female with altered mental status. EXAM: CT HEAD WITHOUT CONTRAST TECHNIQUE: Contiguous axial images were obtained from the base of the skull  through the vertex without intravenous contrast. COMPARISON:  Head CT dated 07/31/2020. FINDINGS: Brain: Mild age-related atrophy and chronic microvascular ischemic changes. There is no acute intracranial hemorrhage. No mass effect or midline shift no extra-axial fluid collection. Vascular: No hyperdense vessel or unexpected calcification. Skull: Normal. Negative for fracture or focal lesion. Sinuses/Orbits: No acute finding. Other: None IMPRESSION: 1. No acute intracranial pathology. 2. Mild age-related atrophy and chronic microvascular ischemic changes. Electronically Signed   By: Anner Crete M.D.   On: 08/15/2020 16:13   CT Head Wo Contrast  Result Date: 07/31/2020 CLINICAL DATA:  [place] tonight with trauma to the head and neck EXAM: CT HEAD WITHOUT CONTRAST CT CERVICAL SPINE WITHOUT CONTRAST TECHNIQUE: Multidetector CT imaging of the head and cervical spine was performed following the standard protocol without intravenous contrast. Multiplanar CT image reconstructions of the cervical spine were also generated. COMPARISON:  07/17/2020 FINDINGS: CT HEAD FINDINGS Brain: Generalized atrophy. Mild chronic small-vessel ischemic changes of the white matter. No cortical or large vessel territory infarction. No mass lesion, hemorrhage, hydrocephalus or extra-axial collection. Vascular: There is atherosclerotic calcification of the major vessels at the base of the brain. Skull: Negative Sinuses/Orbits:  Clear/normal Other: None CT CERVICAL SPINE FINDINGS Alignment: Straightening of the normal cervical lordosis. Skull base and vertebrae: No fracture or focal bone lesion. Soft tissues and spinal canal: No soft tissue neck lesion seen. Disc levels: Foramen magnum is widely patent. No significant finding at C1-2. C2-3: Bulging of the disc. No facet arthropathy. No compressive stenosis. C3-4: Chronic central disc herniation which is partially calcified. This effaces the ventral subarachnoid space but does not visibly compress the cord. No apparent foraminal encroachment. C4-5: Chronic central disc herniation which is partially calcified. Effacement of the ventral subarachnoid space but no compression of the cord. Mild bony foraminal narrowing on the right. C5-6: Annular calcification and bulging slightly more prominent towards the left. No compressive central canal stenosis. Proximal foraminal narrowing on the left. C6-7: Normal interspace. C7-T1: Normal interspace. Upper chest: Negative Other: None IMPRESSION: Head CT: No acute or traumatic finding. Mild atrophy and chronic small-vessel change white matter. Cervical spine CT: No acute or traumatic finding. Chronic disc herniations at C3-4 and C4-5 that effaces the subarachnoid space but do not visibly compress the cord. Mild right foraminal narrowing at C4-5. Degenerative spondylosis at C5-6 with proximal foraminal narrowing on the left. Electronically Signed   By: Nelson Chimes M.D.   On: 07/31/2020 20:43   CT Head Wo Contrast  Result Date: 07/17/2020 CLINICAL DATA:  Mental status change EXAM: CT HEAD WITHOUT CONTRAST TECHNIQUE: Contiguous axial images were obtained from the base of the skull through the vertex without intravenous contrast. COMPARISON:  CT head 06/07/2020 FINDINGS: Brain: Mild cerebral atrophy. Negative for hydrocephalus. Mild white matter hypodensity bilaterally is chronic and unchanged. Negative for acute infarct, hemorrhage, mass. Vascular:  Negative for hyperdense vessel Skull: Negative Sinuses/Orbits: Paranasal sinuses clear. Bilateral cataract extraction Other: None IMPRESSION: No acute abnormality Mild atrophy and mild white matter changes consistent with chronic microvascular ischemia. Electronically Signed   By: Franchot Gallo M.D.   On: 07/17/2020 21:23   CT Chest Wo Contrast  Result Date: 08/15/2020 CLINICAL DATA:  Right-sided chest and abdominal pain with diarrhea, initial encounter EXAM: CT CHEST, ABDOMEN AND PELVIS WITHOUT CONTRAST TECHNIQUE: Multidetector CT imaging of the chest, abdomen and pelvis was performed following the standard protocol without IV contrast. COMPARISON:  Chest x-ray from earlier in the same day, 03/21/2020. FINDINGS: CT CHEST FINDINGS  Cardiovascular: Limited due to lack of IV contrast. Atherosclerotic calcifications are noted. No cardiac enlargement is seen. No significant coronary calcifications are noted. Pulmonary artery shows no significant enlargement. Mediastinum/Nodes: Thoracic inlet is within normal limits. Scattered small hilar and mediastinal lymph nodes are noted likely reactive in nature. No sizable adenopathy is seen. The esophagus as visualized is within normal limits. Lungs/Pleura: Lungs are well aerated bilaterally. No focal infiltrate or sizable effusion is seen. A few calcified granulomas are noted. Musculoskeletal: Chronic compression fractures of T10 and T12 are seen. No rib abnormality is noted. CT ABDOMEN PELVIS FINDINGS Hepatobiliary: No focal liver abnormality is seen. No biliary ductal dilatation is seen. The gallbladder is not well visualized. Pancreas: Diffuse calcifications are noted consistent with chronic pancreatitis. Spleen: Normal in size without focal abnormality. Adrenals/Urinary Tract: Adrenal glands are within normal limits. Kidneys demonstrate scattered small nonobstructing stones in the lower poles bilaterally. No ureteral dilatation is seen. The bladder is partially distended  but obscured by scatter artifact Stomach/Bowel: Mild diverticular change of the colon is noted. The colon is predominately decompressed. No small bowel obstructive changes are seen. Stomach is decompressed. Vascular/Lymphatic: Aortic atherosclerosis. No enlarged abdominal or pelvic lymph nodes. Reproductive: Uterus is diffusely calcified similar to that seen on prior exam. Other: Mild ascites is noted somewhat less than that seen on the prior exam. No definitive hernia is seen. Mild changes of anasarca are again noted. Musculoskeletal: Right hip prosthesis is noted. L2 chronic compression deformity is seen. Degenerative changes of lumbar spine are noted. IMPRESSION: CT of the chest: Changes of prior granulomatous disease. CT of the abdomen and pelvis: Mild ascites stable from the prior exam. Some mild changes of anasarca are noted as well. Diverticulosis without diverticulitis. Chronic changes similar to that noted on the prior exam. Aortic Atherosclerosis (ICD10-I70.0). Electronically Signed   By: Inez Catalina M.D.   On: 08/15/2020 16:18   CT Cervical Spine Wo Contrast  Result Date: 07/31/2020 CLINICAL DATA:  [place] tonight with trauma to the head and neck EXAM: CT HEAD WITHOUT CONTRAST CT CERVICAL SPINE WITHOUT CONTRAST TECHNIQUE: Multidetector CT imaging of the head and cervical spine was performed following the standard protocol without intravenous contrast. Multiplanar CT image reconstructions of the cervical spine were also generated. COMPARISON:  07/17/2020 FINDINGS: CT HEAD FINDINGS Brain: Generalized atrophy. Mild chronic small-vessel ischemic changes of the white matter. No cortical or large vessel territory infarction. No mass lesion, hemorrhage, hydrocephalus or extra-axial collection. Vascular: There is atherosclerotic calcification of the major vessels at the base of the brain. Skull: Negative Sinuses/Orbits: Clear/normal Other: None CT CERVICAL SPINE FINDINGS Alignment: Straightening of the normal  cervical lordosis. Skull base and vertebrae: No fracture or focal bone lesion. Soft tissues and spinal canal: No soft tissue neck lesion seen. Disc levels: Foramen magnum is widely patent. No significant finding at C1-2. C2-3: Bulging of the disc. No facet arthropathy. No compressive stenosis. C3-4: Chronic central disc herniation which is partially calcified. This effaces the ventral subarachnoid space but does not visibly compress the cord. No apparent foraminal encroachment. C4-5: Chronic central disc herniation which is partially calcified. Effacement of the ventral subarachnoid space but no compression of the cord. Mild bony foraminal narrowing on the right. C5-6: Annular calcification and bulging slightly more prominent towards the left. No compressive central canal stenosis. Proximal foraminal narrowing on the left. C6-7: Normal interspace. C7-T1: Normal interspace. Upper chest: Negative Other: None IMPRESSION: Head CT: No acute or traumatic finding. Mild atrophy and chronic small-vessel change white  matter. Cervical spine CT: No acute or traumatic finding. Chronic disc herniations at C3-4 and C4-5 that effaces the subarachnoid space but do not visibly compress the cord. Mild right foraminal narrowing at C4-5. Degenerative spondylosis at C5-6 with proximal foraminal narrowing on the left. Electronically Signed   By: Nelson Chimes M.D.   On: 07/31/2020 20:43   DG Chest Portable 1 View  Result Date: 08/15/2020 CLINICAL DATA:  Abdominal pain and diarrhea EXAM: PORTABLE CHEST 1 VIEW COMPARISON:  07/17/2020 FINDINGS: Mild elevation right hemidiaphragm with mild right lower lobe atelectasis unchanged. Mild left lower lobe atelectasis has developed since the prior study Heart size upper normal. Negative for heart failure. No significant pleural effusion. IMPRESSION: Mild bibasilar atelectasis. Electronically Signed   By: Franchot Gallo M.D.   On: 08/15/2020 14:38   DG Chest Port 1 View  Result Date:  07/17/2020 CLINICAL DATA:  Altered mental status EXAM: PORTABLE CHEST 1 VIEW COMPARISON:  06/08/2020 FINDINGS: Heart is borderline in size. Lungs clear. No effusions or edema. No acute bony abnormality. IMPRESSION: No active disease. Electronically Signed   By: Rolm Baptise M.D.   On: 07/17/2020 20:53    Orson Eva, DO  [facility]  If 7PM-7AM, please contact night-coverage [email or web address] Password [email or web address] 08/16/2020, 8:45 AM   LOS: 0 days

## 2020-08-16 NOTE — Consult Note (Signed)
[facility] Renal Consultation Note    Indication for Consultation:  Management of ESRD/hemodialysis; anemia, hypertension/volume and secondary hyperparathyroidism  HPI: Erica [name] is a 64 y.o. female with a PMH significant for DM, HTN, diastolic CHF, chronic pancreatitis, and ESRD on HD TTS at [facility] who presented to [facility] ED with a 3 day history of diarrhea and abdominal pain.  She describes it as "pure liquid" but denies any nausea, vomiting, hematochezia, melena, or BRBPR.  Her last HD session was 08/10/20 and didn't go yesterday because of the diarrhea.  We were consulted to provide HD during her hospitalization.   Per ED notes, she had some AMS and was somnolent in the ED, however she is awake and alert this morning.   Past Medical History:  Diagnosis Date   Alcohol-induced pancreatitis    Chronic diarrhea    Depression    Diabetes mellitus    fasting blood sugar 110-120s   Dialysis patient ([place])    Diastolic CHF ([place])    DKA (diabetic ketoacidoses)    Gastroparesis    GERD (gastroesophageal reflux disease)    Heart murmur    History of kidney stones    Hyperlipidemia    Hypertension    Hypokalemia    Muscle spasm    Neuropathic pain    Neuropathy    Hx: of   Pyelonephritis    Seizures ([place])    Vitamin B12 deficiency    Vitamin D deficiency    Past Surgical History:  Procedure Laterality Date   AV FISTULA PLACEMENT Left 12/15/2017   Procedure: INSERTION OF ARTERIOVENOUS (AV) GORE-TEX GRAFT ARM;  Surgeon: Rosetta Posner, MD;  Location: [facility] OR;  Service: Vascular;  Laterality: Left;   CATARACT EXTRACTION W/PHACO Right 03/14/2015   Procedure: CATARACT EXTRACTION PHACO AND INTRAOCULAR LENS PLACEMENT ([place]);  Surgeon: Baruch Goldmann, MD;  Location: [facility] ORS;  Service: Ophthalmology;  Laterality: Right;  CDE:11.13   CATARACT EXTRACTION W/PHACO Left 04/11/2015   Procedure: CATARACT EXTRACTION PHACO AND INTRAOCULAR LENS PLACEMENT LEFT EYE CDE=9.68;  Surgeon:  Baruch Goldmann, MD;  Location: [facility] OR[facility];  Service: Ophthalmology;  Laterality: Left;   COLONOSCOPY  02/24/2010   cystoscopy with ureteral stent  Bilateral 10/07/2017   At [place] CV LINE RIGHT  11/17/2017   IR FLUORO GUIDE CV LINE RIGHT  11/22/2017   IR REMOVAL TUN CV CATH W/O FL  01/26/2018   IR US GUIDE VASC ACCESS RIGHT  11/17/2017   MULTIPLE EXTRACTIONS WITH ALVEOLOPLASTY N/A 06/13/2012   Procedure: MULTIPLE EXTRACION WITH ALVEOLOPLASTY EXTRACT: 18, 19, 20, 21, 22, 24, 25, 27, 28, 29, 30, 31;  Surgeon: Gae Bon, DDS;  Location: [place];  Service: Oral Surgery;  Laterality: N/A;   ORIF HUMERUS FRACTURE Right 04/20/2019   Procedure: OPEN REDUCTION INTERNAL FIXATION (ORIF) PROXIMAL HUMERUS FRACTURE;  Surgeon: Marchia Bond, MD;  Location: [place];  Service: Orthopedics;  Laterality: Right;   TUBAL LIGATION     URETERAL STENT PLACEMENT  09/2017   Family History:   Family History  Problem Relation Age of Onset   Diabetes Sister    Chronic Renal Failure Neg Hx    Social History:  reports that she has never smoked. Her smokeless tobacco use includes snuff. She reports previous alcohol use of about 1.0 standard drink of alcohol per week. She reports that she does not use drugs. Allergies  Allergen Reactions   Aspirin Palpitations    Listed on [facility] 11/12/18  Prior to Admission medications   Medication Sig Start Date End Date Taking? Authorizing Provider  acetaminophen (TYLENOL) 325 MG tablet Take 650 mg by mouth every 4 (four) hours as needed for moderate pain.    [provider]  albuterol (VENTOLIN HFA) 108 (90 Base) MCG/ACT inhaler Inhale 2 puffs into the lungs every 4 (four) hours as needed for wheezing or shortness of breath.     [provider]  calcitRIOL (ROCALTROL) 0.25 MCG capsule Take 1 capsule (0.25 mcg total) by mouth Every Tuesday,Thursday,and Saturday with dialysis. 12/18/17   Love, Ivan Anchors, PA-C  carboxymethylcellulose (ARTIFICIAL  TEARS) 1 % ophthalmic solution Place 1 drop into both eyes daily as needed (dry eyes). Patient not taking: Reported on 06/08/2020    [provider]  carvedilol (COREG) 6.25 MG tablet Take 1 tablet (6.25 mg total) by mouth 2 (two) times daily. 11/15/18   Kinnie Feil, MD  cinacalcet (SENSIPAR) 30 MG tablet Take 30 mg by mouth every evening.    [provider]  Emollient (CETAPHIL) cream Apply 1 application topically every 12 (twelve) hours as needed (dry skin). Patient not taking: Reported on 06/08/2020    [provider]  ferric citrate (AURYXIA) 1 GM 210 MG(Fe) tablet Take 2 tablets (420 mg total) by mouth 3 (three) times daily with meals. 11/15/18   Kinnie Feil, MD  folic acid (FOLVITE) 1 MG tablet Take 1 mg by mouth every morning.    [provider]  hydrALAZINE (APRESOLINE) 25 MG tablet Take 1 tablet (25 mg total) by mouth every 8 (eight) hours. 11/19/19   Oswald Hillock, MD  HYDROcodone-acetaminophen (NORCO/VICODIN) 5-325 MG tablet Take 1 tablet by mouth every 4 (four) hours as needed for moderate pain or severe pain. 06/10/20   Manuella Ghazi, Pratik D, DO  insulin lispro (HUMALOG) 100 UNIT/ML injection Inject 2-10 Units into the skin 4 (four) times daily. Per sliding scale: CBG 151-200 2 units, 201-250 4 units, 251-300 6 units, 301-350 8 units, 351-400 10 units,    [provider]  levETIRAcetam (KEPPRA) 1000 MG tablet Take 1,000 mg by mouth every morning.    [provider]  loperamide (IMODIUM) 2 MG capsule Take 4 mg by mouth daily as needed for diarrhea or loose stools.    [provider]  losartan (COZAAR) 25 MG tablet Take 1 tablet (25 mg total) by mouth daily. 11/20/19   Oswald Hillock, MD  metoCLOPramide (REGLAN) 5 MG tablet Take 1 tablet (5 mg total) by mouth every 6 (six) hours as needed for nausea, vomiting or refractory nausea / vomiting. 11/19/19   Oswald Hillock, MD  NIFEdipine (ADALAT CC) 60 MG 24 hr tablet Take 1 tablet (60 mg  total) by mouth every morning. 11/20/19   Oswald Hillock, MD  NIFEdipine (PROCARDIA XL/NIFEDICAL XL) 60 MG 24 hr tablet Take 60 mg by mouth daily. 06/14/20   [provider]  NOVOLOG FLEXPEN 100 UNIT/ML FlexPen Inject into the skin. 07/16/20   [provider]  oxyCODONE-acetaminophen (PERCOCET) 5-325 MG tablet Take 1 tablet by mouth every 6 (six) hours as needed. 07/24/20   Milton Ferguson, MD  pantoprazole (PROTONIX) 40 MG tablet Take 1 tablet (40 mg total) by mouth daily. 11/19/19 01/18/20  Oswald Hillock, MD  polyethylene glycol (MIRALAX) 17 g packet Take 17 g by mouth daily. Patient not taking: Reported on 06/08/2020 08/10/19   Tacy Learn, PA-C  promethazine (PHENERGAN) 25 MG tablet Take 25-50 mg by mouth  every 8 (eight) hours as needed. 07/16/20   [provider]  rOPINIRole (REQUIP) 1 MG tablet Take 1 mg by mouth 3 (three) times daily.    [provider]  sertraline (ZOLOFT) 50 MG tablet Take 1 tablet by mouth daily. 07/16/20   [provider]  Vitamin D, Ergocalciferol, (DRISDOL) 1.25 MG (50000 UT) CAPS capsule Take 50,000 Units by mouth every Friday.    [provider]   Current Facility-Administered Medications  Medication Dose Route Frequency Provider Last Rate Last Admin   0.9 %  sodium chloride infusion   Intravenous Continuous Emokpae, Ejiroghene E, MD 50 mL/hr at 08/15/20 2252 New Bag at 08/15/20 2252   acetaminophen (TYLENOL) tablet 650 mg  650 mg Oral Q6H PRN Emokpae, Ejiroghene E, MD       Or   acetaminophen (TYLENOL) suppository 650 mg  650 mg Rectal Q6H PRN Emokpae, Ejiroghene E, MD       [START ON 08/17/2020] calcitRIOL (ROCALTROL) capsule 0.25 mcg  0.25 mcg Oral Q T,Th,Sa-HD Emokpae, Ejiroghene E, MD       carvedilol (COREG) tablet 6.25 mg  6.25 mg Oral BID Emokpae, Ejiroghene E, MD   6.25 mg at 08/16/20 0850   Chlorhexidine Gluconate Cloth 2 % PADS 6 each  6 each Topical Q0600 Gean Quint, MD   6 each at 08/16/20 [id number]   cinacalcet  (SENSIPAR) tablet 30 mg  30 mg Oral QPM Emokpae, Ejiroghene E, MD       heparin injection 5,000 Units  5,000 Units Subcutaneous Q8H Emokpae, Ejiroghene E, MD   5,000 Units at 08/16/20 [id number]   hydrALAZINE (APRESOLINE) injection 10 mg  10 mg Intravenous Q4H PRN Emokpae, Ejiroghene E, MD       hydrALAZINE (APRESOLINE) tablet 25 mg  25 mg Oral Q8H Emokpae, Ejiroghene E, MD       insulin aspart (novoLOG) injection 0-5 Units  0-5 Units Subcutaneous QHS Emokpae, Ejiroghene E, MD   2 Units at 08/15/20 2253   insulin aspart (novoLOG) injection 0-6 Units  0-6 Units Subcutaneous TID WC Emokpae, Ejiroghene E, MD       levETIRAcetam (KEPPRA) tablet 1,000 mg  1,000 mg Oral q morning Emokpae, Ejiroghene E, MD   1,000 mg at 08/16/20 0857   LORazepam (ATIVAN) tablet 0.5 mg  0.5 mg Oral Once Emokpae, Ejiroghene E, MD       losartan (COZAAR) tablet 25 mg  25 mg Oral Daily Emokpae, Ejiroghene E, MD   25 mg at 08/16/20 0850   NIFEdipine (PROCARDIA-XL/NIFEDICAL-XL) 24 hr tablet 60 mg  60 mg Oral q morning Emokpae, Ejiroghene E, MD   60 mg at 08/16/20 0858   ondansetron (ZOFRAN) tablet 4 mg  4 mg Oral Q6H PRN Emokpae, Ejiroghene E, MD       Or   ondansetron (ZOFRAN) injection 4 mg  4 mg Intravenous Q6H PRN Emokpae, Ejiroghene E, MD       pantoprazole (PROTONIX) EC tablet 40 mg  40 mg Oral Daily Emokpae, Ejiroghene E, MD   40 mg at 08/16/20 0851   polyethylene glycol (MIRALAX / GLYCOLAX) packet 17 g  17 g Oral Daily PRN Emokpae, Ejiroghene E, MD       Labs: Basic Metabolic Panel: Recent Labs  Lab 08/15/20 1355 08/16/20 0621  NA 126* 129*  K 4.1 4.1  CL 94* 95*  CO2 14* 19*  GLUCOSE 229* 97  BUN 70* 73*  CREATININE 6.17* 6.60*  CALCIUM 8.1* 8.7*   Liver Function Tests:  Recent Labs  Lab 08/15/20 1355  AST 42*  ALT 65*  ALKPHOS 473*  BILITOT 0.7  PROT 7.0  ALBUMIN 3.3*   Recent Labs  Lab 08/15/20 1355  LIPASE 16   Recent Labs  Lab 08/15/20 1342  AMMONIA 45*   CBC: Recent Labs  Lab  08/15/20 1355 08/16/20 0500  WBC 8.3 6.7  NEUTROABS 6.5  --   HGB 10.0* 9.2*  HCT 30.6* 28.4*  MCV 108.5* 108.8*  PLT 159 155   Cardiac Enzymes: No results for input(s): CKTOTAL, CKMB, CKMBINDEX, TROPONINI in the last 168 hours. CBG: Recent Labs  Lab 08/15/20 2134 08/16/20 0758  GLUCAP 211* 101*   Iron Studies: No results for input(s): IRON, TIBC, TRANSFERRIN, FERRITIN in the last 72 hours. Studies/Results: CT Abdomen Pelvis Wo Contrast  Result Date: 08/15/2020 CLINICAL DATA:  Right-sided chest and abdominal pain with diarrhea, initial encounter EXAM: CT CHEST, ABDOMEN AND PELVIS WITHOUT CONTRAST TECHNIQUE: Multidetector CT imaging of the chest, abdomen and pelvis was performed following the standard protocol without IV contrast. COMPARISON:  Chest x-ray from earlier in the same day, 03/21/2020. FINDINGS: CT CHEST FINDINGS Cardiovascular: Limited due to lack of IV contrast. Atherosclerotic calcifications are noted. No cardiac enlargement is seen. No significant coronary calcifications are noted. Pulmonary artery shows no significant enlargement. Mediastinum/Nodes: Thoracic inlet is within normal limits. Scattered small hilar and mediastinal lymph nodes are noted likely reactive in nature. No sizable adenopathy is seen. The esophagus as visualized is within normal limits. Lungs/Pleura: Lungs are well aerated bilaterally. No focal infiltrate or sizable effusion is seen. A few calcified granulomas are noted. Musculoskeletal: Chronic compression fractures of T10 and T12 are seen. No rib abnormality is noted. CT ABDOMEN PELVIS FINDINGS Hepatobiliary: No focal liver abnormality is seen. No biliary ductal dilatation is seen. The gallbladder is not well visualized. Pancreas: Diffuse calcifications are noted consistent with chronic pancreatitis. Spleen: Normal in size without focal abnormality. Adrenals/Urinary Tract: Adrenal glands are within normal limits. Kidneys demonstrate scattered small  nonobstructing stones in the lower poles bilaterally. No ureteral dilatation is seen. The bladder is partially distended but obscured by scatter artifact Stomach/Bowel: Mild diverticular change of the colon is noted. The colon is predominately decompressed. No small bowel obstructive changes are seen. Stomach is decompressed. Vascular/Lymphatic: Aortic atherosclerosis. No enlarged abdominal or pelvic lymph nodes. Reproductive: Uterus is diffusely calcified similar to that seen on prior exam. Other: Mild ascites is noted somewhat less than that seen on the prior exam. No definitive hernia is seen. Mild changes of anasarca are again noted. Musculoskeletal: Right hip prosthesis is noted. L2 chronic compression deformity is seen. Degenerative changes of lumbar spine are noted. IMPRESSION: CT of the chest: Changes of prior granulomatous disease. CT of the abdomen and pelvis: Mild ascites stable from the prior exam. Some mild changes of anasarca are noted as well. Diverticulosis without diverticulitis. Chronic changes similar to that noted on the prior exam. Aortic Atherosclerosis (ICD10-I70.0). Electronically Signed   By: Inez Catalina M.D.   On: 08/15/2020 16:18   CT Head Wo Contrast  Result Date: 08/15/2020 CLINICAL DATA:  65 year old female with altered mental status. EXAM: CT HEAD WITHOUT CONTRAST TECHNIQUE: Contiguous axial images were obtained from the base of the skull through the vertex without intravenous contrast. COMPARISON:  Head CT dated 07/31/2020. FINDINGS: Brain: Mild age-related atrophy and chronic microvascular ischemic changes. There is no acute intracranial hemorrhage. No mass effect or midline shift no extra-axial fluid collection. Vascular: No hyperdense vessel or unexpected calcification.  Skull: Normal. Negative for fracture or focal lesion. Sinuses/Orbits: No acute finding. Other: None IMPRESSION: 1. No acute intracranial pathology. 2. Mild age-related atrophy and chronic microvascular ischemic  changes. Electronically Signed   By: Anner Crete M.D.   On: 08/15/2020 16:13   CT Chest Wo Contrast  Result Date: 08/15/2020 CLINICAL DATA:  Right-sided chest and abdominal pain with diarrhea, initial encounter EXAM: CT CHEST, ABDOMEN AND PELVIS WITHOUT CONTRAST TECHNIQUE: Multidetector CT imaging of the chest, abdomen and pelvis was performed following the standard protocol without IV contrast. COMPARISON:  Chest x-ray from earlier in the same day, 03/21/2020. FINDINGS: CT CHEST FINDINGS Cardiovascular: Limited due to lack of IV contrast. Atherosclerotic calcifications are noted. No cardiac enlargement is seen. No significant coronary calcifications are noted. Pulmonary artery shows no significant enlargement. Mediastinum/Nodes: Thoracic inlet is within normal limits. Scattered small hilar and mediastinal lymph nodes are noted likely reactive in nature. No sizable adenopathy is seen. The esophagus as visualized is within normal limits. Lungs/Pleura: Lungs are well aerated bilaterally. No focal infiltrate or sizable effusion is seen. A few calcified granulomas are noted. Musculoskeletal: Chronic compression fractures of T10 and T12 are seen. No rib abnormality is noted. CT ABDOMEN PELVIS FINDINGS Hepatobiliary: No focal liver abnormality is seen. No biliary ductal dilatation is seen. The gallbladder is not well visualized. Pancreas: Diffuse calcifications are noted consistent with chronic pancreatitis. Spleen: Normal in size without focal abnormality. Adrenals/Urinary Tract: Adrenal glands are within normal limits. Kidneys demonstrate scattered small nonobstructing stones in the lower poles bilaterally. No ureteral dilatation is seen. The bladder is partially distended but obscured by scatter artifact Stomach/Bowel: Mild diverticular change of the colon is noted. The colon is predominately decompressed. No small bowel obstructive changes are seen. Stomach is decompressed. Vascular/Lymphatic: Aortic  atherosclerosis. No enlarged abdominal or pelvic lymph nodes. Reproductive: Uterus is diffusely calcified similar to that seen on prior exam. Other: Mild ascites is noted somewhat less than that seen on the prior exam. No definitive hernia is seen. Mild changes of anasarca are again noted. Musculoskeletal: Right hip prosthesis is noted. L2 chronic compression deformity is seen. Degenerative changes of lumbar spine are noted. IMPRESSION: CT of the chest: Changes of prior granulomatous disease. CT of the abdomen and pelvis: Mild ascites stable from the prior exam. Some mild changes of anasarca are noted as well. Diverticulosis without diverticulitis. Chronic changes similar to that noted on the prior exam. Aortic Atherosclerosis (ICD10-I70.0). Electronically Signed   By: Inez Catalina M.D.   On: 08/15/2020 16:18   DG Chest Portable 1 View  Result Date: 08/15/2020 CLINICAL DATA:  Abdominal pain and diarrhea EXAM: PORTABLE CHEST 1 VIEW COMPARISON:  07/17/2020 FINDINGS: Mild elevation right hemidiaphragm with mild right lower lobe atelectasis unchanged. Mild left lower lobe atelectasis has developed since the prior study Heart size upper normal. Negative for heart failure. No significant pleural effusion. IMPRESSION: Mild bibasilar atelectasis. Electronically Signed   By: Franchot Gallo M.D.   On: 08/15/2020 14:38    ROS: Pertinent items are noted in HPI. Physical Exam: Vitals:   08/15/20 2056 08/16/20 0144 08/16/20 0439 08/16/20 0908  BP: (!) 192/80 (!) 174/69 (!) 178/77 (!) 181/67  Pulse: 73 72 70 71  Resp: '19 18 18 16  '$ Temp: 98.2 F (36.8 C) 98.4 F (36.9 C) 98.3 F (36.8 C) 98.7 F (37.1 C)  TempSrc: Oral Oral  Oral  SpO2: 100% 100% 100% 100%  Weight:      Height:  Weight change:   Intake/Output Summary (Last 24 hours) at 08/16/2020 0945 Last data filed at 08/16/2020 [id number] Gross per 24 hour  Intake 253.78 ml  Output 0 ml  Net 253.78 ml   BP (!) 181/67 (BP Location: Right Arm)    Pulse 71   Temp 98.7 F (37.1 C) (Oral)   Resp 16   Ht 5' (1.524 m)   Wt 42.2 kg   SpO2 100%   BMI 18.16 kg/m  General appearance: alert, cooperative, and no distress Head: Normocephalic, without obvious abnormality, atraumatic Resp: clear to auscultation bilaterally Cardio: regular rate and rhythm, S1, S2 normal, no murmur, click, rub or gallop GI: soft, non-tender; bowel sounds normal; no masses,  no organomegaly Extremities: extremities normal, atraumatic, no cyanosis or edema and LUE AVG +T/B Dialysis Access: LUE AVG  OUTPATIENT DIALYSIS:  [facility]  on TTS EDW 42.5kg, Time: 3.75 hours, 2K/ 3 Ca, BFR 400, DFR 600,  Dialyzer: [place] Revacir 200, LUE AVG Heparin 1000 unit load, 500 units/hr (stop 60 min before end of HD)  Assessment/Plan:  Gastroenteritis - GI pathogen panel pending, C diff negative.   Continue with supportive care.  ESRD -  off schedule and plan for HD today and if still here again tomorrow.   Hypertension/volume  - likely dehydrated by history so will limit UF with HD.  Resume home bp meds and follow.   Anemia  - Will dose with ESA and follow.  Metabolic bone disease -  continue with home meds and renal diet  Nutrition - renal diet, carb modified.  Donetta Potts, MD [place] Pager [phone] 08/16/2020, 9:45 AM

## 2020-08-16 NOTE — Evaluation (Addendum)
Physical Therapy Evaluation Patient Details Name: Erica [name] MRN: [id number] DOB: 17-Oct-1955 Today's Date: 08/16/2020   History of Present Illness  Erica [name] is a 65 y.o. female with medical history significant for ESRD- T Th sat, diastolic CHF, , HTN, diabetes mellitus, alcoholic pancreatitis.  On my evaluation patient is awake alert and oriented x 4 and able to give me a history.  Patient presented to the ED with complaints of vomiting, epigastric abdominal pain and diarrhea, of 2 days duration.  She reports vomitus is clear, and she has had 3 episodes today.  She reports multiple loose stools, and reports she is unable to control her bowel movements.  Since last HD was Saturday-5 days ago.  She did not go because she was feeling ill.  Denies recent antibiotic prescription.  Patient also reports she fell while she was using her walker about a week ago.  Since then she has had problems with vision in her left eye.  Patient reports she makes a little amount of urine.   Clinical Impression  Patient presents in bed and agreeable to therapy. Patient demonstrates modified independence for bed mobility and transfer to sitting at EOB. Patient was able to follow directions and reposition herself without assistance. Patient ambulated with RW for 20 with min guard, patient was noted to have shuffling like gait pattern and some unsteadiness during SL stance phases of gait. Patient returned to chair with chair alarm on - RN notified. Patient will benefit from continued physical therapy in hospital and recommended venue below to increase strength, balance, endurance for safe ADLs and gait.     Follow Up Recommendations SNF;Supervision for mobility/OOB;Supervision - Intermittent;Other (comment) (SNF unless family can manage min assist with transfers and ADLs)    Equipment Recommendations  None recommended by PT    Recommendations for Other Services       Precautions / Restrictions  Precautions Precautions: Fall Restrictions Weight Bearing Restrictions: No      Mobility  Bed Mobility Overal bed mobility: Modified Independent             General bed mobility comments: increased time and labored movement Patient Response: Cooperative  Transfers Overall transfer level: Modified independent Equipment used: Rolling walker (2 wheeled)                Ambulation/Gait Ambulation/Gait assistance: Min assist;Min guard Gait Distance (Feet): 20 Feet Assistive device: Rolling walker (2 wheeled) Gait Pattern/deviations: Decreased step length - left;Decreased step length - right;Decreased stride length;Narrow base of support Gait velocity: decreased   General Gait Details: shuffling like gait pattern, increased time and labored movement  Stairs            Wheelchair Mobility    Modified Rankin (Stroke Patients Only)       Balance Overall balance assessment: Needs assistance Sitting-balance support: Feet supported Sitting balance-Leahy Scale: Good Sitting balance - Comments: good/fair at EOB   Standing balance support: Bilateral upper extremity supported;During functional activity Standing balance-Leahy Scale: Fair Standing balance comment: fair/poor with RW                             Pertinent Vitals/Pain Pain Assessment: No/denies pain    Home Living Family/patient expects to be discharged to:: Private residence Living Arrangements: Children Available Help at Discharge: Family;Available PRN/intermittently Type of Home: Apartment Home Access: Level entry     Home Layout: One level Home Equipment: Walker - 4 wheels;Cane -  single point;Wheelchair - [employer] - 2 wheels      Prior Function Level of Independence: Needs assistance   Gait / Transfers 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  ADL's / Homemaking Assistance Needed: Patient able to complete most basic ADL, family assists PRN  Comments:  patient reports household ambulator wiht rollator     Hand Dominance   Dominant Hand: Right    Extremity/Trunk Assessment   Upper Extremity Assessment Upper Extremity Assessment: Generalized weakness    Lower Extremity Assessment Lower Extremity Assessment: Generalized weakness    Cervical / Trunk Assessment Cervical / Trunk Assessment: Normal  Communication   Communication: No difficulties  Cognition Arousal/Alertness: Awake/alert Behavior During Therapy: WFL for tasks assessed/performed Overall Cognitive Status: Within Functional Limits for tasks assessed                                        General Comments      Exercises     Assessment/Plan    PT Assessment Patient needs continued PT services  PT Problem List Decreased strength;Decreased activity tolerance;Decreased balance;Decreased mobility;Decreased coordination       PT Treatment Interventions Gait training;Functional mobility training;Therapeutic activities;Therapeutic exercise;Balance training;Patient/family education    PT Goals (Current goals can be found in the Care Plan section)  Acute Rehab PT Goals Patient Stated Goal: home PT Goal Formulation: With patient Time For Goal Achievement: 08/30/20 Potential to Achieve Goals: Good    Frequency Min 3X/week   Barriers to discharge        Co-evaluation               AM-PAC PT "6 Clicks" Mobility  Outcome Measure Help needed turning from your back to your side while in a flat bed without using bedrails?: None Help needed moving from lying on your back to sitting on the side of a flat bed without using bedrails?: None Help needed moving to and from a bed to a chair (including a wheelchair)?: A Little Help needed standing up from a chair using your arms (e.g., wheelchair or bedside chair)?: A Little Help needed to walk in hospital room?: A Little Help needed climbing 3-5 steps with a railing? : A Lot 6 Click Score: 19     End of Session   Activity Tolerance: Patient tolerated treatment well;Patient limited by fatigue Patient left: in chair;with call bell/phone within reach;with chair alarm set Nurse Communication: Mobility status PT Visit Diagnosis: Unsteadiness on feet (R26.81);Other abnormalities of gait and mobility (R26.89);Muscle weakness (generalized) (M62.81)    Time: [id number] PT Time Calculation (min) (ACUTE ONLY): 15 min   Charges:   PT Evaluation $PT Eval Low Complexity: 1 Low PT Treatments $Therapeutic Activity: 8-22 mins      12:09 PM, 08/16/20 Ludwin Flahive Cousler SPT  12:09 PM, 08/16/20 Lonell Grandchild, MPT Physical Therapist with [facility] [phone] office [phone] mobile phone

## 2020-08-17 LAB — COMPREHENSIVE METABOLIC PANEL
ALT: 45 U/L — ABNORMAL HIGH (ref 0–44)
AST: 36 U/L (ref 15–41)
Albumin: 2.9 g/dL — ABNORMAL LOW (ref 3.5–5.0)
Alkaline Phosphatase: 421 U/L — ABNORMAL HIGH (ref 38–126)
Anion gap: 11 (ref 5–15)
BUN: 22 mg/dL (ref 8–23)
CO2: 25 mmol/L (ref 22–32)
Calcium: 8 mg/dL — ABNORMAL LOW (ref 8.9–10.3)
Chloride: 92 mmol/L — ABNORMAL LOW (ref 98–111)
Creatinine, Ser: 3.14 mg/dL — ABNORMAL HIGH (ref 0.44–1.00)
GFR, Estimated: 16 mL/min — ABNORMAL LOW (ref >60)
Glucose, Bld: 240 mg/dL — ABNORMAL HIGH (ref 70–99)
Potassium: 4.1 mmol/L (ref 3.5–5.1)
Sodium: 128 mmol/L — ABNORMAL LOW (ref 135–145)
Total Bilirubin: 0.8 mg/dL (ref 0.3–1.2)
Total Protein: 6.4 g/dL — ABNORMAL LOW (ref 6.5–8.1)

## 2020-08-17 LAB — AMMONIA: Ammonia: 75 umol/L — ABNORMAL HIGH (ref 9–35)

## 2020-08-17 LAB — CBC
HCT: 29.2 % — ABNORMAL LOW (ref 36.0–46.0)
Hemoglobin: 9.5 g/dL — ABNORMAL LOW (ref 12.0–15.0)
MCH: 34.9 pg — ABNORMAL HIGH (ref 26.0–34.0)
MCHC: 32.5 g/dL (ref 30.0–36.0)
MCV: 107.4 fL — ABNORMAL HIGH (ref 80.0–100.0)
Platelets: 158 10*3/uL (ref 150–400)
RBC: 2.72 MIL/uL — ABNORMAL LOW (ref 3.87–5.11)
RDW: 12.8 % (ref 11.5–15.5)
WBC: 4.9 10*3/uL (ref 4.0–10.5)
nRBC: 0 % (ref 0.0–0.2)

## 2020-08-17 LAB — GLUCOSE, CAPILLARY
Glucose-Capillary: 212 mg/dL — ABNORMAL HIGH (ref 70–99)
Glucose-Capillary: 131 mg/dL — ABNORMAL HIGH (ref 70–99)
Glucose-Capillary: 108 mg/dL — ABNORMAL HIGH (ref 70–99)
Glucose-Capillary: 260 mg/dL — ABNORMAL HIGH (ref 70–99)

## 2020-08-17 LAB — LIPASE, BLOOD: Lipase: 16 U/L (ref 11–51)

## 2020-08-17 LAB — HEMOGLOBIN A1C
Hgb A1c MFr Bld: 9 % — ABNORMAL HIGH (ref 4.8–5.6)
Mean Plasma Glucose: 212 mg/dL

## 2020-08-17 LAB — HEPATITIS B SURFACE ANTIGEN: Hepatitis B Surface Ag: NONREACTIVE

## 2020-08-17 NOTE — NC FL2 (Signed)
[place] MEDICAID FL2 LEVEL OF CARE SCREENING TOOL     IDENTIFICATION  Patient Name: Erica [name] Birthdate: [birth year] Sex: female Admission Date (Current Location): 08/15/2020  [facility] and [place] Number:  [employer] and Address:  [facility] [address], [place]      Provider Number: [id number]  Attending Physician Name and Address:  Orson Eva, MD  Relative Name and Phone Number:  Lorenda Hatchet)   [phone]    Current Level of Care: SNF Recommended Level of Care: [place] Prior Approval Number:    Date Approved/Denied: 05/08/19 PASRR Number: [id number]  Discharge Plan: SNF    Current Diagnoses: Patient Active Problem List   Diagnosis Date Noted   Acute metabolic encephalopathy 123456   Diarrhea    Pressure injury of skin 06/09/2020   Sacral fracture ([place]) 06/08/2020   Thrombocytopenia ([place]) 06/08/2020   Hypertensive urgency 06/08/2020   (HFpEF) heart failure with preserved ejection fraction ([place]) 06/08/2020   Fall 06/08/2020   Elevated MCV 06/08/2020   Palliative care by specialist    Generalized weakness 11/14/2019   HCAP (healthcare-associated pneumonia) 11/14/2019   Hyperkalemia 11/13/2019   Hyponatremia 09/27/2019   Closed fracture of right proximal humerus 04/20/2019   Surgery, elective    Seizure ([place]) 11/12/2018   History of hydronephrosis --stents in place 12/08/2017   Stroke ([place]) 11/29/2017   Seizures ([place])    Hydronephrosis    Recurrent UTI    Diabetes mellitus type 2 in nonobese ([place])    ESRD (end stage renal disease) ([place])    Anemia of chronic disease    Chronic diastolic congestive heart failure ([place])    Essential hypertension    PICC (peripherally inserted central catheter) in place    Acute pyelonephritis 11/12/2017   Uncontrolled type 2 diabetes mellitus with hyperglycemia, with long-term current use of insulin ([place]) 11/10/2017   Renal failure 11/08/2017    Seizure disorder ([place]) 11/08/2017   Orthostatic hypotension 07/25/2014   Moderate protein malnutrition (HCC) 09/15/2013   Aspiration pneumonia ([place]) 09/15/2013   Acute respiratory failure requiring reintubation ([place]) 09/11/2013   probable Seizures due to metabolic disorder [id number]   Lactic acidosis 03/19/2013   Abdominal pain 03/19/2013   Rotavirus infection 10/29/2012   Type II or unspecified type diabetes mellitus without mention of complication, uncontrolled 10/29/2012   Protein-calorie malnutrition, severe ([place]) 10/27/2012   NSTEMI (non-ST elevated myocardial infarction) ([place]) 10/26/2012   Fever, unspecified 10/26/2012   Hypotension XX123456   Metabolic acidosis XX123456   Chronic diarrhea 10/25/2012   Tobacco abuse 10/25/2012   DKA (diabetic ketoacidoses) 09/09/2012   Dehydration 09/09/2012   DKA, type 2 ([place]) 05/20/2012   Abnormal LFTs 05/20/2012   Heart murmur, systolic [id number]   Hypoglycemia XX123456   Metabolic encephalopathy XX123456   Alcohol abuse 07/08/2011   Hypokalemia 07/08/2011   Nausea & vomiting 07/08/2011   H/O chronic pancreatitis 07/08/2011    Orientation RESPIRATION BLADDER Height & Weight     Self, Time, Situation, Place  Normal Continent Weight: 107 lb 9.4 oz (48.8 kg) Height:  5' (152.4 cm)  BEHAVIORAL SYMPTOMS/MOOD NEUROLOGICAL BOWEL NUTRITION STATUS      Continent Diet  AMBULATORY STATUS COMMUNICATION OF NEEDS Skin   Limited Assist Verbally Normal                       Personal Care Assistance Level of Assistance  Bathing, Feeding, Dressing Bathing Assistance: Limited assistance Feeding assistance: Maximum assistance  Dressing Assistance: Limited assistance     Functional Limitations Info  Sight, Hearing, Speech Sight Info: Impaired Hearing Info: Adequate Speech Info: Adequate    SPECIAL CARE FACTORS FREQUENCY  PT (By licensed PT)     PT Frequency: 5x              Contractures Contractures Info: Not present     Additional Factors Info  Code Status, Allergies Code Status Info: Full Allergies Info: Aspirin           Current Medications (08/17/2020):  This is the current hospital active medication list Current Facility-Administered Medications  Medication Dose Route Frequency Provider Last Rate Last Admin   acetaminophen (TYLENOL) tablet 650 mg  650 mg Oral Q6H PRN Emokpae, Ejiroghene E, MD   650 mg at 08/16/20 2202   Or   acetaminophen (TYLENOL) suppository 650 mg  650 mg Rectal Q6H PRN Emokpae, Ejiroghene E, MD       calcitRIOL (ROCALTROL) capsule 0.25 mcg  0.25 mcg Oral Q T,Th,Sa-HD Emokpae, Ejiroghene E, MD   0.25 mcg at 08/17/20 1300   carvedilol (COREG) tablet 6.25 mg  6.25 mg Oral BID Emokpae, Ejiroghene E, MD   6.25 mg at 08/16/20 0850   Chlorhexidine Gluconate Cloth 2 % PADS 6 each  6 each Topical Q0600 Gean Quint, MD   6 each at 08/17/20 0529   cinacalcet (SENSIPAR) tablet 30 mg  30 mg Oral QPM Emokpae, Ejiroghene E, MD       heparin injection 5,000 Units  5,000 Units Subcutaneous Q8H Emokpae, Ejiroghene E, MD   5,000 Units at 08/17/20 0528   hydrALAZINE (APRESOLINE) injection 10 mg  10 mg Intravenous Q4H PRN Emokpae, Ejiroghene E, MD       hydrALAZINE (APRESOLINE) tablet 25 mg  25 mg Oral Q8H Emokpae, Ejiroghene E, MD   25 mg at 08/16/20 2200   insulin aspart (novoLOG) injection 0-5 Units  0-5 Units Subcutaneous QHS Emokpae, Ejiroghene E, MD   2 Units at 08/15/20 2253   insulin aspart (novoLOG) injection 0-6 Units  0-6 Units Subcutaneous TID WC Emokpae, Ejiroghene E, MD   2 Units at 08/17/20 0847   levETIRAcetam (KEPPRA) tablet 1,000 mg  1,000 mg Oral q morning Emokpae, Ejiroghene E, MD   1,000 mg at 08/17/20 0844   LORazepam (ATIVAN) tablet 0.5 mg  0.5 mg Oral Once Emokpae, Ejiroghene E, MD       losartan (COZAAR) tablet 25 mg  25 mg Oral Daily Emokpae, Ejiroghene E, MD   25 mg at 08/16/20 0850   NIFEdipine (PROCARDIA-XL/NIFEDICAL-XL) 24 hr tablet 60 mg  60 mg Oral q morning  Emokpae, Ejiroghene E, MD   60 mg at 08/16/20 0858   ondansetron (ZOFRAN) tablet 4 mg  4 mg Oral Q6H PRN Emokpae, Ejiroghene E, MD       Or   ondansetron (ZOFRAN) injection 4 mg  4 mg Intravenous Q6H PRN Emokpae, Ejiroghene E, MD       pantoprazole (PROTONIX) EC tablet 40 mg  40 mg Oral Daily Emokpae, Ejiroghene E, MD   40 mg at 08/17/20 0845   polyethylene glycol (MIRALAX / GLYCOLAX) packet 17 g  17 g Oral Daily PRN Emokpae, Ejiroghene E, MD         Discharge Medications: Please see discharge summary for a list of discharge medications.  Relevant Imaging Results:  Relevant Lab Results:   Additional Information Pt SSN: [id number]  Natasha Bence, LCSW

## 2020-08-17 NOTE — Progress Notes (Addendum)
PROGRESS NOTE  Erica [name] [id number] DOB: 01-19-1956 DOA: 08/15/2020 PCP: [name], NP   Brief History:  65 year old female with a history of ESRD (TTS), diabetes mellitus type 2, coronary disease, hypertension, hyperlipidemia, B12 deficiency, seizure disorder presenting with 1 week history of upper abdominal pain with associated nausea, vomiting, and diarrhea.  The patient is a difficult historian.  She has some delayed responses and frequently contradicts historical events during the same conversation.  Nevertheless, the patient has been complaining of the above symptoms which have been worsening over the past 2 days.  She has had decreased oral intake and increasing generalized weakness.  Because of the above symptoms, the patient has missed dialysis.  Her last dialysis was on Saturday, 08/10/2020.  She denies any fevers, chills, chest pain, headache, hematemesis, hematochezia, melena.  She states that eating does make her abdominal pain a little worse.  She has had some dyspnea on exertion but denies any chest pain.  She denies any alcohol use or illicit drug use.  She denies any recent antibiotics.  The patient lives with her son, but she states that he has been feeling fine. In the emergency department, the patient was afebrile hemodynamically stable with oxygen saturation 90-100% on room air.  BMP showed a sodium 126, potassium 4.1, bicarbonate 14, serum creatinine 6.17.  WBC 8.3, hemoglobin 10.0, platelets 159,000.   Assessment/Plan: Acute metabolic encephalopathy -Multifactorial including uremia, dehydration, and possible infectious process -Currently alert and oriented x 2 but with delayed responses -Ammonia 45 -Check [id number] -Folic [id number] -Follow-up MRI brain---no acute findings -TSH--2.298   Intractable nausea, vomiting, diarrhea -C. difficile negative -Stool pathogen panel--neg -continue Protonix daily -GI consult if no improvement -CT abd--no  bowel obstruction or inflammation;  neg pancreatic stranding -08/17/20--n/v much improved -diet advanced  Hyperammonemia -uncleared clinical significance as mental status is improving   Hyponatremia -Secondary to ESRD and missed HD   ESRD -Nephrology consulted -Plan for HD 08/16/2020 -Continue calcitriol and Sensipar   Chronic diastolic CHF -Appears clinically euvolemic -Fluid removal by dialysis -08/15/20 CT chest--no infiltrates nor pleural effusion   Transaminasemia -This appears to be chronic and likely due to hepatic steatosis -Monitor clinically -Repeat LFTs--stable   Seizure disorder -Continue Keppra   Diabetes mellitus type 2, uncontrolled with hyperglycemia -Hemoglobin A1c--9.0 -NovoLog sliding scale   Essential hypertension -Continue carvedilol, hydralazine, losartan, nifedipine   History of chronic pancreatitis-May be contributing to patient's symptoms.    -Lipase stable at 16 -CT abd--pancreatic calcifications         Status is: Inpatient   The patient will need to be admitted as she will stay>2 midnights due to intolerant to po intake and continued confusion   Dispo: The patient is from: Home              Anticipated d/c is to: Home              Patient currently is not medically stable to d/c.              Difficult to place patient No               Family Communication:  son updated 7/2   Consultants:  renal   Code Status:  FULL   DVT Prophylaxis:  [place] Heparin     Procedures: As Listed in Progress Note Above   Antibiotics: None      Subjective: Patient states n/v/d are improved.  Denies  f/c, cp, sob, abd pain.  Objective: Vitals:   08/17/20 1400 08/17/20 1430 08/17/20 1450 08/17/20 1505  BP: (!) 146/66 (!) 145/64 (!) 144/61 (!) 149/63  Pulse: 76 77 76 77  Resp: '14 12 14 16  '$ Temp:    98.2 F (36.8 C)  TempSrc:    Oral  SpO2:      Weight:      Height:        Intake/Output Summary (Last 24 hours) at 08/17/2020 1712 Last  data filed at 08/17/2020 1450 Gross per 24 hour  Intake 480 ml  Output 1523 ml  Net -1043 ml   Weight change: -0.084 kg Exam:  General:  Pt is alert, follows commands appropriately, not in acute distress HEENT: No icterus, No thrush, No neck mass, [place]/AT Cardiovascular: RRR, S1/S2, no rubs, no gallops Respiratory: bibasilar crackles. No wheeze Abdomen: Soft/+BS, non tender, non distended, no guarding Extremities: No edema, No lymphangitis, No petechiae, No rashes, no synovitis   Data Reviewed: I have personally reviewed following labs and imaging studies Basic Metabolic Panel: Recent Labs  Lab 08/15/20 1355 08/15/20 1357 08/16/20 0621 08/17/20 0700  NA 126*  --  129* 128*  K 4.1  --  4.1 4.1  CL 94*  --  95* 92*  CO2 14*  --  19* 25  GLUCOSE 229*  --  97 240*  BUN 70*  --  73* 22  CREATININE 6.17*  --  6.60* 3.14*  CALCIUM 8.1*  --  8.7* 8.0*  MG  --  2.2  --   --    Liver Function Tests: Recent Labs  Lab 08/15/20 1355 08/17/20 0700  AST 42* 36  ALT 65* 45*  ALKPHOS 473* 421*  BILITOT 0.7 0.8  PROT 7.0 6.4*  ALBUMIN 3.3* 2.9*   Recent Labs  Lab 08/15/20 1355 08/17/20 0700  LIPASE 16 16   Recent Labs  Lab 08/15/20 1342 08/17/20 1145  AMMONIA 45* 75*   Coagulation Profile: No results for input(s): INR, PROTIME in the last 168 hours. CBC: Recent Labs  Lab 08/15/20 1355 08/16/20 0500 08/17/20 0700  WBC 8.3 6.7 4.9  NEUTROABS 6.5  --   --   HGB 10.0* 9.2* 9.5*  HCT 30.6* 28.4* 29.2*  MCV 108.5* 108.8* 107.4*  PLT 159 155 158   Cardiac Enzymes: No results for input(s): CKTOTAL, CKMB, CKMBINDEX, TROPONINI in the last 168 hours. BNP: Invalid input(s): POCBNP CBG: Recent Labs  Lab 08/16/20 1107 08/16/20 2133 08/17/20 0740 08/17/20 1116 08/17/20 1615  GLUCAP 172* 181* 212* 131* 108*   HbA1C: Recent Labs    08/16/20 0500  HGBA1C 9.0*   Urine analysis:    Component Value Date/Time   COLORURINE YELLOW 09/27/2019 1310   APPEARANCEUR  TURBID (A) 09/27/2019 1310   LABSPEC 1.013 09/27/2019 1310   PHURINE 7.0 09/27/2019 1310   GLUCOSEU 50 (A) 09/27/2019 1310   HGBUR SMALL (A) 09/27/2019 1310   BILIRUBINUR NEGATIVE 09/27/2019 1310   KETONESUR NEGATIVE 09/27/2019 1310   PROTEINUR >=300 (A) 09/27/2019 1310   UROBILINOGEN 0.2 07/25/2014 2309   NITRITE NEGATIVE 09/27/2019 1310   LEUKOCYTESUR MODERATE (A) 09/27/2019 1310   Sepsis Labs: '@LABRCNTIP'$ (procalcitonin:4,lacticidven:4) ) Recent Results (from the past 240 hour(s))  Resp Panel by RT-PCR (Flu A&B, Covid) Nasopharyngeal Swab     Status: None   Collection Time: 08/15/20  1:37 PM   Specimen: Nasopharyngeal Swab; Nasopharyngeal(NP) swabs in vial transport medium  Result Value Ref Range Status   SARS Coronavirus 2 by  RT PCR NEGATIVE NEGATIVE Final    Comment: (NOTE) SARS-CoV-2 target nucleic acids are NOT DETECTED.  The SARS-CoV-2 RNA is generally detectable in upper respiratory specimens during the acute phase of infection. The lowest concentration of SARS-CoV-2 viral copies this assay can detect is 138 copies/mL. A negative result does not preclude SARS-Cov-2 infection and should not be used as the sole basis for treatment or other patient management decisions. A negative result may occur with  improper specimen collection/handling, submission of specimen other than nasopharyngeal swab, presence of viral mutation(s) within the areas targeted by this assay, and inadequate number of viral copies(<138 copies/mL). A negative result must be combined with clinical observations, patient history, and epidemiological information. The expected result is Negative.  Fact Sheet for Patients:  EntrepreneurPulse.com.au  Fact Sheet for Healthcare Providers:  IncredibleEmployment.be  This test is no t yet approved or cleared by the [place] FDA and  has been authorized for detection and/or diagnosis of SARS-CoV-2 by FDA under an  Emergency Use Authorization (EUA). This EUA will remain  in effect (meaning this test can be used) for the duration of the COVID-19 declaration under Section 564(b)(1) of the Act, 21 U.S.C.section 360bbb-3(b)(1), unless the authorization is terminated  or revoked sooner.       Influenza A by PCR NEGATIVE NEGATIVE Final   Influenza B by PCR NEGATIVE NEGATIVE Final    Comment: (NOTE) The Xpert Xpress SARS-CoV-2/FLU/RSV plus assay is intended as an aid in the diagnosis of influenza from Nasopharyngeal swab specimens and should not be used as a sole basis for treatment. Nasal washings and aspirates are unacceptable for Xpert Xpress SARS-CoV-2/FLU/RSV testing.  Fact Sheet for Patients: EntrepreneurPulse.com.au  Fact Sheet for Healthcare Providers: IncredibleEmployment.be  This test is not yet approved or cleared by the [place] FDA and has been authorized for detection and/or diagnosis of SARS-CoV-2 by FDA under an Emergency Use Authorization (EUA). This EUA will remain in effect (meaning this test can be used) for the duration of the COVID-19 declaration under Section 564(b)(1) of the Act, 21 U.S.C. section 360bbb-3(b)(1), unless the authorization is terminated or revoked.  Performed at [facility], [address], [place], [place] [address]   Culture, blood (routine x 2)     Status: None (Preliminary result)   Collection Time: 08/15/20  1:56 PM   Specimen: BLOOD RIGHT ARM  Result Value Ref Range Status   Specimen Description   Final    BLOOD RIGHT ARM BOTTLES DRAWN AEROBIC AND ANAEROBIC   Special Requests Blood Culture adequate volume  Final   Culture   Final    NO GROWTH 2 DAYS Performed at [facility], [address], [place], [place] [address]    Report Status PENDING  Incomplete  Culture, blood (routine x 2)     Status: None (Preliminary result)   Collection Time: 08/15/20  1:57 PM   Specimen: BLOOD RIGHT HAND  Result Value Ref  Range Status   Specimen Description   Final    BLOOD RIGHT HAND BOTTLES DRAWN AEROBIC AND ANAEROBIC   Special Requests Blood Culture adequate volume  Final   Culture   Final    NO GROWTH 2 DAYS Performed at [facility], [name] [place], [place] [address]    Report Status PENDING  Incomplete  Gastrointestinal Panel by PCR , Stool     Status: None   Collection Time: 08/15/20  8:40 PM   Specimen: Stool  Result Value Ref Range Status   Campylobacter  species NOT DETECTED NOT DETECTED Final   Plesimonas shigelloides NOT DETECTED NOT DETECTED Final   Salmonella species NOT DETECTED NOT DETECTED Final   Yersinia enterocolitica NOT DETECTED NOT DETECTED Final   Vibrio species NOT DETECTED NOT DETECTED Final   Vibrio cholerae NOT DETECTED NOT DETECTED Final   Enteroaggregative E coli (EAEC) NOT DETECTED NOT DETECTED Final   Enteropathogenic E coli (EPEC) NOT DETECTED NOT DETECTED Final   Enterotoxigenic E coli (ETEC) NOT DETECTED NOT DETECTED Final   Shiga like toxin producing E coli (STEC) NOT DETECTED NOT DETECTED Final   Shigella/Enteroinvasive E coli (EIEC) NOT DETECTED NOT DETECTED Final   Cryptosporidium NOT DETECTED NOT DETECTED Final   Cyclospora cayetanensis NOT DETECTED NOT DETECTED Final   Entamoeba histolytica NOT DETECTED NOT DETECTED Final   Giardia lamblia NOT DETECTED NOT DETECTED Final   Adenovirus F40/41 NOT DETECTED NOT DETECTED Final   Astrovirus NOT DETECTED NOT DETECTED Final   Norovirus GI/GII NOT DETECTED NOT DETECTED Final   Rotavirus A NOT DETECTED NOT DETECTED Final   Sapovirus (I, II, IV, and V) NOT DETECTED NOT DETECTED Final    Comment: Performed at [facility], [address] [place], [place] [address]  C Difficile Quick Screen w PCR reflex     Status: None   Collection Time: 08/15/20  8:40 PM   Specimen: Stool  Result Value Ref Range Status   C Diff antigen NEGATIVE NEGATIVE Final   C Diff toxin NEGATIVE NEGATIVE Final   C Diff  interpretation No C. difficile detected.  Final    Comment: Performed at [facility], [address], [place], [place] [address]     Scheduled Meds:  calcitRIOL  0.25 mcg Oral Q T,Th,Sa-HD   carvedilol  6.25 mg Oral BID   Chlorhexidine Gluconate Cloth  6 each Topical Q0600   cinacalcet  30 mg Oral QPM   heparin  5,000 Units Subcutaneous Q8H   hydrALAZINE  25 mg Oral Q8H   insulin aspart  0-5 Units Subcutaneous QHS   insulin aspart  0-6 Units Subcutaneous TID WC   levETIRAcetam  1,000 mg Oral q morning   LORazepam  0.5 mg Oral Once   losartan  25 mg Oral Daily   NIFEdipine  60 mg Oral q morning   pantoprazole  40 mg Oral Daily   Continuous Infusions:  Procedures/Studies: CT Abdomen Pelvis Wo Contrast  Result Date: 08/15/2020 CLINICAL DATA:  Right-sided chest and abdominal pain with diarrhea, initial encounter EXAM: CT CHEST, ABDOMEN AND PELVIS WITHOUT CONTRAST TECHNIQUE: Multidetector CT imaging of the chest, abdomen and pelvis was performed following the standard protocol without IV contrast. COMPARISON:  Chest x-ray from earlier in the same day, 03/21/2020. FINDINGS: CT CHEST FINDINGS Cardiovascular: Limited due to lack of IV contrast. Atherosclerotic calcifications are noted. No cardiac enlargement is seen. No significant coronary calcifications are noted. Pulmonary artery shows no significant enlargement. Mediastinum/Nodes: Thoracic inlet is within normal limits. Scattered small hilar and mediastinal lymph nodes are noted likely reactive in nature. No sizable adenopathy is seen. The esophagus as visualized is within normal limits. Lungs/Pleura: Lungs are well aerated bilaterally. No focal infiltrate or sizable effusion is seen. A few calcified granulomas are noted. Musculoskeletal: Chronic compression fractures of T10 and T12 are seen. No rib abnormality is noted. CT ABDOMEN PELVIS FINDINGS Hepatobiliary: No focal liver abnormality is seen. No biliary ductal dilatation is seen. The  gallbladder is not well visualized. Pancreas: Diffuse calcifications are noted consistent with chronic pancreatitis. Spleen:  Normal in size without focal abnormality. Adrenals/Urinary Tract: Adrenal glands are within normal limits. Kidneys demonstrate scattered small nonobstructing stones in the lower poles bilaterally. No ureteral dilatation is seen. The bladder is partially distended but obscured by scatter artifact Stomach/Bowel: Mild diverticular change of the colon is noted. The colon is predominately decompressed. No small bowel obstructive changes are seen. Stomach is decompressed. Vascular/Lymphatic: Aortic atherosclerosis. No enlarged abdominal or pelvic lymph nodes. Reproductive: Uterus is diffusely calcified similar to that seen on prior exam. Other: Mild ascites is noted somewhat less than that seen on the prior exam. No definitive hernia is seen. Mild changes of anasarca are again noted. Musculoskeletal: Right hip prosthesis is noted. L2 chronic compression deformity is seen. Degenerative changes of lumbar spine are noted. IMPRESSION: CT of the chest: Changes of prior granulomatous disease. CT of the abdomen and pelvis: Mild ascites stable from the prior exam. Some mild changes of anasarca are noted as well. Diverticulosis without diverticulitis. Chronic changes similar to that noted on the prior exam. Aortic Atherosclerosis (ICD10-I70.0). Electronically Signed   By: Inez Catalina M.D.   On: 08/15/2020 16:18   DG Shoulder Right  Result Date: 07/31/2020 CLINICAL DATA:  Fall EXAM: RIGHT SHOULDER - 2+ VIEW COMPARISON:  None. FINDINGS: Postoperative changes in the proximal right humerus with plate and screw fixation device. No acute fracture, subluxation or dislocation. Mild degenerative changes in the [facility] joint. Glenohumeral joint is maintained. IMPRESSION: No acute bony abnormality. Electronically Signed   By: Rolm Baptise M.D.   On: 07/31/2020 20:50   CT Head Wo Contrast  Result Date:  08/15/2020 CLINICAL DATA:  65 year old female with altered mental status. EXAM: CT HEAD WITHOUT CONTRAST TECHNIQUE: Contiguous axial images were obtained from the base of the skull through the vertex without intravenous contrast. COMPARISON:  Head CT dated 07/31/2020. FINDINGS: Brain: Mild age-related atrophy and chronic microvascular ischemic changes. There is no acute intracranial hemorrhage. No mass effect or midline shift no extra-axial fluid collection. Vascular: No hyperdense vessel or unexpected calcification. Skull: Normal. Negative for fracture or focal lesion. Sinuses/Orbits: No acute finding. Other: None IMPRESSION: 1. No acute intracranial pathology. 2. Mild age-related atrophy and chronic microvascular ischemic changes. Electronically Signed   By: Anner Crete M.D.   On: 08/15/2020 16:13   CT Head Wo Contrast  Result Date: 07/31/2020 CLINICAL DATA:  [place] tonight with trauma to the head and neck EXAM: CT HEAD WITHOUT CONTRAST CT CERVICAL SPINE WITHOUT CONTRAST TECHNIQUE: Multidetector CT imaging of the head and cervical spine was performed following the standard protocol without intravenous contrast. Multiplanar CT image reconstructions of the cervical spine were also generated. COMPARISON:  07/17/2020 FINDINGS: CT HEAD FINDINGS Brain: Generalized atrophy. Mild chronic small-vessel ischemic changes of the white matter. No cortical or large vessel territory infarction. No mass lesion, hemorrhage, hydrocephalus or extra-axial collection. Vascular: There is atherosclerotic calcification of the major vessels at the base of the brain. Skull: Negative Sinuses/Orbits: Clear/normal Other: None CT CERVICAL SPINE FINDINGS Alignment: Straightening of the normal cervical lordosis. Skull base and vertebrae: No fracture or focal bone lesion. Soft tissues and spinal canal: No soft tissue neck lesion seen. Disc levels: Foramen magnum is widely patent. No significant finding at C1-2. C2-3: Bulging of the disc. No  facet arthropathy. No compressive stenosis. C3-4: Chronic central disc herniation which is partially calcified. This effaces the ventral subarachnoid space but does not visibly compress the cord. No apparent foraminal encroachment. C4-5: Chronic central disc herniation which is partially calcified. Effacement of  the ventral subarachnoid space but no compression of the cord. Mild bony foraminal narrowing on the right. C5-6: Annular calcification and bulging slightly more prominent towards the left. No compressive central canal stenosis. Proximal foraminal narrowing on the left. C6-7: Normal interspace. C7-T1: Normal interspace. Upper chest: Negative Other: None IMPRESSION: Head CT: No acute or traumatic finding. Mild atrophy and chronic small-vessel change white matter. Cervical spine CT: No acute or traumatic finding. Chronic disc herniations at C3-4 and C4-5 that effaces the subarachnoid space but do not visibly compress the cord. Mild right foraminal narrowing at C4-5. Degenerative spondylosis at C5-6 with proximal foraminal narrowing on the left. Electronically Signed   By: Nelson Chimes M.D.   On: 07/31/2020 20:43   CT Chest Wo Contrast  Result Date: 08/15/2020 CLINICAL DATA:  Right-sided chest and abdominal pain with diarrhea, initial encounter EXAM: CT CHEST, ABDOMEN AND PELVIS WITHOUT CONTRAST TECHNIQUE: Multidetector CT imaging of the chest, abdomen and pelvis was performed following the standard protocol without IV contrast. COMPARISON:  Chest x-ray from earlier in the same day, 03/21/2020. FINDINGS: CT CHEST FINDINGS Cardiovascular: Limited due to lack of IV contrast. Atherosclerotic calcifications are noted. No cardiac enlargement is seen. No significant coronary calcifications are noted. Pulmonary artery shows no significant enlargement. Mediastinum/Nodes: Thoracic inlet is within normal limits. Scattered small hilar and mediastinal lymph nodes are noted likely reactive in nature. No sizable  adenopathy is seen. The esophagus as visualized is within normal limits. Lungs/Pleura: Lungs are well aerated bilaterally. No focal infiltrate or sizable effusion is seen. A few calcified granulomas are noted. Musculoskeletal: Chronic compression fractures of T10 and T12 are seen. No rib abnormality is noted. CT ABDOMEN PELVIS FINDINGS Hepatobiliary: No focal liver abnormality is seen. No biliary ductal dilatation is seen. The gallbladder is not well visualized. Pancreas: Diffuse calcifications are noted consistent with chronic pancreatitis. Spleen: Normal in size without focal abnormality. Adrenals/Urinary Tract: Adrenal glands are within normal limits. Kidneys demonstrate scattered small nonobstructing stones in the lower poles bilaterally. No ureteral dilatation is seen. The bladder is partially distended but obscured by scatter artifact Stomach/Bowel: Mild diverticular change of the colon is noted. The colon is predominately decompressed. No small bowel obstructive changes are seen. Stomach is decompressed. Vascular/Lymphatic: Aortic atherosclerosis. No enlarged abdominal or pelvic lymph nodes. Reproductive: Uterus is diffusely calcified similar to that seen on prior exam. Other: Mild ascites is noted somewhat less than that seen on the prior exam. No definitive hernia is seen. Mild changes of anasarca are again noted. Musculoskeletal: Right hip prosthesis is noted. L2 chronic compression deformity is seen. Degenerative changes of lumbar spine are noted. IMPRESSION: CT of the chest: Changes of prior granulomatous disease. CT of the abdomen and pelvis: Mild ascites stable from the prior exam. Some mild changes of anasarca are noted as well. Diverticulosis without diverticulitis. Chronic changes similar to that noted on the prior exam. Aortic Atherosclerosis (ICD10-I70.0). Electronically Signed   By: Inez Catalina M.D.   On: 08/15/2020 16:18   CT Cervical Spine Wo Contrast  Result Date: 07/31/2020 CLINICAL  DATA:  [place] tonight with trauma to the head and neck EXAM: CT HEAD WITHOUT CONTRAST CT CERVICAL SPINE WITHOUT CONTRAST TECHNIQUE: Multidetector CT imaging of the head and cervical spine was performed following the standard protocol without intravenous contrast. Multiplanar CT image reconstructions of the cervical spine were also generated. COMPARISON:  07/17/2020 FINDINGS: CT HEAD FINDINGS Brain: Generalized atrophy. Mild chronic small-vessel ischemic changes of the white matter. No cortical or large vessel  territory infarction. No mass lesion, hemorrhage, hydrocephalus or extra-axial collection. Vascular: There is atherosclerotic calcification of the major vessels at the base of the brain. Skull: Negative Sinuses/Orbits: Clear/normal Other: None CT CERVICAL SPINE FINDINGS Alignment: Straightening of the normal cervical lordosis. Skull base and vertebrae: No fracture or focal bone lesion. Soft tissues and spinal canal: No soft tissue neck lesion seen. Disc levels: Foramen magnum is widely patent. No significant finding at C1-2. C2-3: Bulging of the disc. No facet arthropathy. No compressive stenosis. C3-4: Chronic central disc herniation which is partially calcified. This effaces the ventral subarachnoid space but does not visibly compress the cord. No apparent foraminal encroachment. C4-5: Chronic central disc herniation which is partially calcified. Effacement of the ventral subarachnoid space but no compression of the cord. Mild bony foraminal narrowing on the right. C5-6: Annular calcification and bulging slightly more prominent towards the left. No compressive central canal stenosis. Proximal foraminal narrowing on the left. C6-7: Normal interspace. C7-T1: Normal interspace. Upper chest: Negative Other: None IMPRESSION: Head CT: No acute or traumatic finding. Mild atrophy and chronic small-vessel change white matter. Cervical spine CT: No acute or traumatic finding. Chronic disc herniations at C3-4 and C4-5  that effaces the subarachnoid space but do not visibly compress the cord. Mild right foraminal narrowing at C4-5. Degenerative spondylosis at C5-6 with proximal foraminal narrowing on the left. Electronically Signed   By: Nelson Chimes M.D.   On: 07/31/2020 20:43   MR BRAIN WO CONTRAST  Result Date: 08/16/2020 CLINICAL DATA:  Change to peripheral vision of left eye. EXAM: MRI HEAD WITHOUT CONTRAST TECHNIQUE: Multiplanar, multiecho pulse sequences of the brain and surrounding structures were obtained without intravenous contrast. COMPARISON:  Head CT August 15, 2020. FINDINGS: Brain: No acute infarction, hemorrhage, hydrocephalus, extra-axial collection or mass lesion. Scattered foci of T2 hyperintensity are seen within the white matter the cerebral hemispheres and within the pons, nonspecific, most likely related to chronic small vessel ischemia. 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 Vascular: Normal flow voids. Skull and upper cervical spine: Normal marrow signal noting hyperostosis frontalis interna. Sinuses/Orbits: Bilateral lens surgery. Paranasal sinuses are clear. Other: Mild bilateral mastoid effusion. IMPRESSION: 1. No acute intracranial abnormality. 2. Mild chronic microvascular ischemic changes of the white matter. 3. 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 Electronically Signed   By: Pedro Earls M.D.   On: 08/16/2020 10:46   DG Chest Portable 1 View  Result Date: 08/15/2020 CLINICAL DATA:  Abdominal pain and diarrhea EXAM: PORTABLE CHEST 1 VIEW COMPARISON:  07/17/2020 FINDINGS: Mild elevation right hemidiaphragm with mild right lower lobe atelectasis unchanged. Mild left lower lobe atelectasis has developed since the prior study Heart size upper normal. Negative for heart failure. No significant pleural effusion. IMPRESSION: Mild bibasilar atelectasis. Electronically Signed   By: Franchot Gallo M.D.   On: 08/15/2020 14:38    Orson Eva,  DO  [facility]  If 7PM-7AM, please contact night-coverage [email or web address] Password [email or web address] 08/17/2020, 5:12 PM   LOS: 1 day

## 2020-08-17 NOTE — Plan of Care (Signed)
  Problem: Education: Goal: Knowledge of General Education information will improve Description Including pain rating scale, medication(s)/side effects and non-pharmacologic comfort measures Outcome: Progressing   Problem: Health Behavior/Discharge Planning: Goal: Ability to manage health-related needs will improve Outcome: Progressing   

## 2020-08-17 NOTE — Procedures (Signed)
   HEMODIALYSIS TREATMENT NOTE:  Uneventful 3 hour heparin-free HD completed via left upper arm loop graft. Goal met: 500cc removed without interruption in ultrafiltration.  All blood was returned.  Hemostasis was achieved in 15 minutes.  No changes from pre-HD assessment.  Rockwell Alexandria, RN

## 2020-08-17 NOTE — Progress Notes (Signed)
[facility] NEPHROLOGY PROGRESS NOTE  Assessment/ Plan: Pt is a 65 y.o. yo female with history of DM, HTN, diastolic CHF, chronic pancreatitis, ESRD on HD TTS at [facility] presented with diarrhea, abdominal pain seen as a consultation for the management of ESRD.  OUTPATIENT DIALYSIS:  [facility]  on TTS EDW 42.5kg, Time: 3.75 hours, 2K/ 3 Ca, BFR 400, DFR 600,  Dialyzer: Gambro Revacir 200, LUE AVG Heparin 1000 unit load, 500 units/hr (stop 60 min before end of HD)  #Gastroenteritis: C differential negative, pending GI pathogen.  Patient reports clinically feeling better.  Continue supportive management.  # ESRD: Received IV scheduled dialysis yesterday and tolerated well.  She is getting regular HD today.  Continue TTS schedule.  Plan for next dialysis on 7/5.  # Anemia: Continue ESA.  Monitor hemoglobin.  # Secondary hyperparathyroidism: Currently on calcitriol and Cinacalcet.  Monitor calcium and phosphorus.  # HTN/volume: On losartan.  UF as tolerated by HD.  #Hyponatremia, hypervolemic: Managed with dialysis.  Discussed with the dialysis nurse and primary team.  Subjective: Seen and examined in dialysis unit.  She reports feeling better.  Denies nausea vomiting chest pain shortness of breath.  She is alert awake and oriented. Objective Vital signs in last 24 hours: Vitals:   08/17/20 0417 08/17/20 1135 08/17/20 1150 08/17/20 1200  BP: (!) 122/48 129/67 130/65 140/67  Pulse: 75 75 75 74  Resp: '16 16 12 14  '$ Temp: 98.4 F (36.9 C) 98.2 F (36.8 C)    TempSrc: Oral Oral    SpO2: 100% 100%    Weight:  48.8 kg    Height:       Weight change: -0.084 kg  Intake/Output Summary (Last 24 hours) at 08/17/2020 1240 Last data filed at 08/17/2020 0900 Gross per 24 hour  Intake 960 ml  Output 1002 ml  Net -42 ml       Labs: Basic Metabolic Panel: Recent Labs  Lab 08/15/20 1355 08/16/20 0621 08/17/20 0700  NA 126* 129* 128*  K 4.1 4.1 4.1  CL 94* 95*  92*  CO2 14* 19* 25  GLUCOSE 229* 97 240*  BUN 70* 73* 22  CREATININE 6.17* 6.60* 3.14*  CALCIUM 8.1* 8.7* 8.0*   Liver Function Tests: Recent Labs  Lab 08/15/20 1355 08/17/20 0700  AST 42* 36  ALT 65* 45*  ALKPHOS 473* 421*  BILITOT 0.7 0.8  PROT 7.0 6.4*  ALBUMIN 3.3* 2.9*   Recent Labs  Lab 08/15/20 1355 08/17/20 0700  LIPASE 16 16   Recent Labs  Lab 08/15/20 1342  AMMONIA 45*   CBC: Recent Labs  Lab 08/15/20 1355 08/16/20 0500 08/17/20 0700  WBC 8.3 6.7 4.9  NEUTROABS 6.5  --   --   HGB 10.0* 9.2* 9.5*  HCT 30.6* 28.4* 29.2*  MCV 108.5* 108.8* 107.4*  PLT 159 155 158   Cardiac Enzymes: No results for input(s): CKTOTAL, CKMB, CKMBINDEX, TROPONINI in the last 168 hours. CBG: Recent Labs  Lab 08/16/20 0758 08/16/20 1107 08/16/20 2133 08/17/20 0740 08/17/20 1116  GLUCAP 101* 172* 181* 212* 131*    Iron Studies: No results for input(s): IRON, TIBC, TRANSFERRIN, FERRITIN in the last 72 hours. Studies/Results: CT Abdomen Pelvis Wo Contrast  Result Date: 08/15/2020 CLINICAL DATA:  Right-sided chest and abdominal pain with diarrhea, initial encounter EXAM: CT CHEST, ABDOMEN AND PELVIS WITHOUT CONTRAST TECHNIQUE: Multidetector CT imaging of the chest, abdomen and pelvis was performed following the standard protocol without IV contrast. COMPARISON:  Chest x-ray from earlier in the same day, 03/21/2020. FINDINGS: CT CHEST FINDINGS Cardiovascular: Limited due to lack of IV contrast. Atherosclerotic calcifications are noted. No cardiac enlargement is seen. No significant coronary calcifications are noted. Pulmonary artery shows no significant enlargement. Mediastinum/Nodes: Thoracic inlet is within normal limits. Scattered small hilar and mediastinal lymph nodes are noted likely reactive in nature. No sizable adenopathy is seen. The esophagus as visualized is within normal limits. Lungs/Pleura: Lungs are well aerated bilaterally. No focal infiltrate or sizable  effusion is seen. A few calcified granulomas are noted. Musculoskeletal: Chronic compression fractures of T10 and T12 are seen. No rib abnormality is noted. CT ABDOMEN PELVIS FINDINGS Hepatobiliary: No focal liver abnormality is seen. No biliary ductal dilatation is seen. The gallbladder is not well visualized. Pancreas: Diffuse calcifications are noted consistent with chronic pancreatitis. Spleen: Normal in size without focal abnormality. Adrenals/Urinary Tract: Adrenal glands are within normal limits. Kidneys demonstrate scattered small nonobstructing stones in the lower poles bilaterally. No ureteral dilatation is seen. The bladder is partially distended but obscured by scatter artifact Stomach/Bowel: Mild diverticular change of the colon is noted. The colon is predominately decompressed. No small bowel obstructive changes are seen. Stomach is decompressed. Vascular/Lymphatic: Aortic atherosclerosis. No enlarged abdominal or pelvic lymph nodes. Reproductive: Uterus is diffusely calcified similar to that seen on prior exam. Other: Mild ascites is noted somewhat less than that seen on the prior exam. No definitive hernia is seen. Mild changes of anasarca are again noted. Musculoskeletal: Right hip prosthesis is noted. L2 chronic compression deformity is seen. Degenerative changes of lumbar spine are noted. IMPRESSION: CT of the chest: Changes of prior granulomatous disease. CT of the abdomen and pelvis: Mild ascites stable from the prior exam. Some mild changes of anasarca are noted as well. Diverticulosis without diverticulitis. Chronic changes similar to that noted on the prior exam. Aortic Atherosclerosis (ICD10-I70.0). Electronically Signed   By: Inez Catalina M.D.   On: 08/15/2020 16:18   CT Head Wo Contrast  Result Date: 08/15/2020 CLINICAL DATA:  65 year old female with altered mental status. EXAM: CT HEAD WITHOUT CONTRAST TECHNIQUE: Contiguous axial images were obtained from the base of the skull through  the vertex without intravenous contrast. COMPARISON:  Head CT dated 07/31/2020. FINDINGS: Brain: Mild age-related atrophy and chronic microvascular ischemic changes. There is no acute intracranial hemorrhage. No mass effect or midline shift no extra-axial fluid collection. Vascular: No hyperdense vessel or unexpected calcification. Skull: Normal. Negative for fracture or focal lesion. Sinuses/Orbits: No acute finding. Other: None IMPRESSION: 1. No acute intracranial pathology. 2. Mild age-related atrophy and chronic microvascular ischemic changes. Electronically Signed   By: Anner Crete M.D.   On: 08/15/2020 16:13   CT Chest Wo Contrast  Result Date: 08/15/2020 CLINICAL DATA:  Right-sided chest and abdominal pain with diarrhea, initial encounter EXAM: CT CHEST, ABDOMEN AND PELVIS WITHOUT CONTRAST TECHNIQUE: Multidetector CT imaging of the chest, abdomen and pelvis was performed following the standard protocol without IV contrast. COMPARISON:  Chest x-ray from earlier in the same day, 03/21/2020. FINDINGS: CT CHEST FINDINGS Cardiovascular: Limited due to lack of IV contrast. Atherosclerotic calcifications are noted. No cardiac enlargement is seen. No significant coronary calcifications are noted. Pulmonary artery shows no significant enlargement. Mediastinum/Nodes: Thoracic inlet is within normal limits. Scattered small hilar and mediastinal lymph nodes are noted likely reactive in nature. No sizable adenopathy is seen. The esophagus as visualized is within normal limits. Lungs/Pleura: Lungs are well aerated bilaterally. No focal infiltrate or sizable effusion  is seen. A few calcified granulomas are noted. Musculoskeletal: Chronic compression fractures of T10 and T12 are seen. No rib abnormality is noted. CT ABDOMEN PELVIS FINDINGS Hepatobiliary: No focal liver abnormality is seen. No biliary ductal dilatation is seen. The gallbladder is not well visualized. Pancreas: Diffuse calcifications are noted  consistent with chronic pancreatitis. Spleen: Normal in size without focal abnormality. Adrenals/Urinary Tract: Adrenal glands are within normal limits. Kidneys demonstrate scattered small nonobstructing stones in the lower poles bilaterally. No ureteral dilatation is seen. The bladder is partially distended but obscured by scatter artifact Stomach/Bowel: Mild diverticular change of the colon is noted. The colon is predominately decompressed. No small bowel obstructive changes are seen. Stomach is decompressed. Vascular/Lymphatic: Aortic atherosclerosis. No enlarged abdominal or pelvic lymph nodes. Reproductive: Uterus is diffusely calcified similar to that seen on prior exam. Other: Mild ascites is noted somewhat less than that seen on the prior exam. No definitive hernia is seen. Mild changes of anasarca are again noted. Musculoskeletal: Right hip prosthesis is noted. L2 chronic compression deformity is seen. Degenerative changes of lumbar spine are noted. IMPRESSION: CT of the chest: Changes of prior granulomatous disease. CT of the abdomen and pelvis: Mild ascites stable from the prior exam. Some mild changes of anasarca are noted as well. Diverticulosis without diverticulitis. Chronic changes similar to that noted on the prior exam. Aortic Atherosclerosis (ICD10-I70.0). Electronically Signed   By: Inez Catalina M.D.   On: 08/15/2020 16:18   MR BRAIN WO CONTRAST  Result Date: 08/16/2020 CLINICAL DATA:  Change to peripheral vision of left eye. EXAM: MRI HEAD WITHOUT CONTRAST TECHNIQUE: Multiplanar, multiecho pulse sequences of the brain and surrounding structures were obtained without intravenous contrast. COMPARISON:  Head CT August 15, 2020. FINDINGS: Brain: No acute infarction, hemorrhage, hydrocephalus, extra-axial collection or mass lesion. Scattered foci of T2 hyperintensity are seen within the white matter the cerebral hemispheres and within the pons, nonspecific, most likely related to chronic small  vessel ischemia. 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 Vascular: Normal flow voids. Skull and upper cervical spine: Normal marrow signal noting hyperostosis frontalis interna. Sinuses/Orbits: Bilateral lens surgery. Paranasal sinuses are clear. Other: Mild bilateral mastoid effusion. IMPRESSION: 1. No acute intracranial abnormality. 2. Mild chronic microvascular ischemic changes of the white matter. 3. 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 Electronically Signed   By: Pedro Earls M.D.   On: 08/16/2020 10:46   DG Chest Portable 1 View  Result Date: 08/15/2020 CLINICAL DATA:  Abdominal pain and diarrhea EXAM: PORTABLE CHEST 1 VIEW COMPARISON:  07/17/2020 FINDINGS: Mild elevation right hemidiaphragm with mild right lower lobe atelectasis unchanged. Mild left lower lobe atelectasis has developed since the prior study Heart size upper normal. Negative for heart failure. No significant pleural effusion. IMPRESSION: Mild bibasilar atelectasis. Electronically Signed   By: Franchot Gallo M.D.   On: 08/15/2020 14:38    Medications: Infusions:   Scheduled Medications:  calcitRIOL  0.25 mcg Oral Q T,Th,Sa-HD   carvedilol  6.25 mg Oral BID   Chlorhexidine Gluconate Cloth  6 each Topical Q0600   cinacalcet  30 mg Oral QPM   heparin  5,000 Units Subcutaneous Q8H   hydrALAZINE  25 mg Oral Q8H   insulin aspart  0-5 Units Subcutaneous QHS   insulin aspart  0-6 Units Subcutaneous TID WC   levETIRAcetam  1,000 mg Oral q morning   LORazepam  0.5 mg Oral Once   losartan  25 mg Oral Daily  NIFEdipine  60 mg Oral q morning   pantoprazole  40 mg Oral Daily    have reviewed scheduled and prn medications.  Physical Exam: General:NAD, comfortable Heart:RRR, s1s2 nl Lungs:clear b/l, no crackle Abdomen:soft, Non-tender, non-distended Extremities:No edema Dialysis Access: Left AV graft.  Erica [name] Erica [name] 08/17/2020,12:40 PM  LOS: 1 day

## 2020-08-17 NOTE — TOC Progression Note (Signed)
Transition of Care [facility]) - Progression Note    Patient Details  Name: Erica [name] MRN: [id number] Date of Birth: 1955/03/03  [facility] of [facility]) CM/SW Contact  Natasha Bence, LCSW Phone Number: 08/17/2020, 3:29 PM  Clinical Narrative:    Patient and family now agreeable to SNF. Patient's son requested referral to [facility]. CSW referred patient to [facility]. CSW also LVM Melissa with [facility] admissions. TOC to follow.   Expected Discharge Plan:  [place]end Barriers to Discharge: Continued Medical Work up  Expected Discharge Plan and Services Expected Discharge Plan: [place]                                               Social Determinants of Health (SDOH) Interventions    Readmission Risk Interventions Readmission Risk Prevention Plan 06/11/2020 11/19/2019 11/15/2019  Transportation Screening Complete Complete -  Medication Review [employer] photographer) Complete Complete -  PCP or Specialist appointment within 3-5 days of discharge - Complete Complete  HRI or Home Care Consult Complete Complete -  SW Recovery Care/Counseling Consult Complete Complete -  Palliative Care Screening Not Applicable Complete -  Skilled Nursing Facility Complete Complete -  Some recent data might be hidden

## 2020-08-18 LAB — GLUCOSE, CAPILLARY
Glucose-Capillary: 181 mg/dL — ABNORMAL HIGH (ref 70–99)
Glucose-Capillary: 251 mg/dL — ABNORMAL HIGH (ref 70–99)
Glucose-Capillary: 191 mg/dL — ABNORMAL HIGH (ref 70–99)
Glucose-Capillary: 197 mg/dL — ABNORMAL HIGH (ref 70–99)

## 2020-08-18 NOTE — Progress Notes (Signed)
PROGRESS NOTE  Erica [name] [id number] DOB: [birth year] DOA: 08/15/2020 PCP: [name], NP  Brief History:  65 year old female with a history of ESRD (TTS), diabetes mellitus type 2, coronary disease, hypertension, hyperlipidemia, B12 deficiency, seizure disorder presenting with 1 week history of upper abdominal pain with associated nausea, vomiting, and diarrhea.  The patient is a difficult historian.  She has some delayed responses and frequently contradicts historical events during the same conversation.  Nevertheless, the patient has been complaining of the above symptoms which have been worsening over the past 2 days.  She has had decreased oral intake and increasing generalized weakness.  Because of the above symptoms, the patient has missed dialysis.  Her last dialysis was on Saturday, 08/10/2020.  She denies any fevers, chills, chest pain, headache, hematemesis, hematochezia, melena.  She states that eating does make her abdominal pain a little worse.  She has had some dyspnea on exertion but denies any chest pain.  She denies any alcohol use or illicit drug use.  She denies any recent antibiotics.  The patient lives with her son, but she states that he has been feeling fine. In the emergency department, the patient was afebrile hemodynamically stable with oxygen saturation 90-100% on room air.  BMP showed a sodium 126, potassium 4.1, bicarbonate 14, serum creatinine 6.17.  WBC 8.3, hemoglobin 10.0, platelets 159,000.   Assessment/Plan: Acute metabolic encephalopathy -Multifactorial including uremia, dehydration, and possible infectious process -Ammonia 45 -Check [id number] -Folic [id number] -CT brain neg -Follow-up MRI brain---no acute findings -TSH--2.298 -7/2 and 7/3--mental status improved   Intractable nausea, vomiting, diarrhea -due to gastroenteritis -C. difficile negative -Stool pathogen panel--neg -continue Protonix daily -GI consult if no  improvement -CT abd--no bowel obstruction or inflammation;  neg pancreatic stranding -08/17/20--n/v much improved -diet advanced which patient is tolerating   Hyperammonemia -uncleared clinical significance as mental status is improving   Hyponatremia -Secondary to ESRD and missed HD   ESRD -Nephrology consulted -Plan for HD 08/16/2020 -Continue calcitriol and Sensipar   Chronic diastolic CHF -Appears clinically euvolemic -Fluid removal by dialysis -08/15/20 CT chest--no infiltrates nor pleural effusion   Transaminasemia -This appears to be chronic and likely due to hepatic steatosis -Monitor clinically -Repeat LFTs--stable -no abd pain   Seizure disorder -Continue Keppra   Diabetes mellitus type 2, uncontrolled with hyperglycemia -Hemoglobin A1c--9.0 -NovoLog sliding scale   Essential hypertension -Continue carvedilol, hydralazine, losartan, nifedipine   History of chronic pancreatitis-May be contributing to patient's symptoms.    -Lipase stable at 16 -CT abd--pancreatic calcifications         Status is: Inpatient   The patient will need to be admitted as she will stay>2 midnights due to intolerant to po intake and continued confusion   Dispo: The patient is from: Home              Anticipated d/c is to: Home              Patient currently is not medically stable to d/c.              Difficult to place patient No               Family Communication:  son updated 7/2   Consultants:  renal   Code Status:  FULL   DVT Prophylaxis:  [place] Heparin     Procedures: As Listed in Progress Note Above   Antibiotics: None   Subjective: Patient denies  fevers, chills, headache, chest pain, dyspnea, nausea, vomiting, diarrhea, abdominal pain, dysuria, hematuria, hematochezia, and melena.   Objective: Vitals:   08/17/20 1505 08/17/20 2031 08/18/20 0536 08/18/20 1443  BP: (!) 149/63 (!) 154/65 (!) 168/65 (!) 124/55  Pulse: 77 88 81 70  Resp: '16 18 18   '$ Temp:  98.2 F (36.8 C) 98.7 F (37.1 C) 98.8 F (37.1 C) 98.9 F (37.2 C)  TempSrc: Oral Oral Oral Oral  SpO2:  100% 100% 100%  Weight:      Height:        Intake/Output Summary (Last 24 hours) at 08/18/2020 1628 Last data filed at 08/18/2020 [id number] Gross per 24 hour  Intake 680 ml  Output --  Net 680 ml   Weight change: 6.7 kg Exam:  General:  Pt is alert, follows commands appropriately, not in acute distress HEENT: No icterus, No thrush, No neck mass, [place]/AT Cardiovascular: RRR, S1/S2, no rubs, no gallops Respiratory: bibasilar rales. No wheeze Abdomen: Soft/+BS, non tender, non distended, no guarding Extremities: No edema, No lymphangitis, No petechiae, No rashes, no synovitis   Data Reviewed: I have personally reviewed following labs and imaging studies Basic Metabolic Panel: Recent Labs  Lab 08/15/20 1355 08/15/20 1357 08/16/20 0621 08/17/20 0700  NA 126*  --  129* 128*  K 4.1  --  4.1 4.1  CL 94*  --  95* 92*  CO2 14*  --  19* 25  GLUCOSE 229*  --  97 240*  BUN 70*  --  73* 22  CREATININE 6.17*  --  6.60* 3.14*  CALCIUM 8.1*  --  8.7* 8.0*  MG  --  2.2  --   --    Liver Function Tests: Recent Labs  Lab 08/15/20 1355 08/17/20 0700  AST 42* 36  ALT 65* 45*  ALKPHOS 473* 421*  BILITOT 0.7 0.8  PROT 7.0 6.4*  ALBUMIN 3.3* 2.9*   Recent Labs  Lab 08/15/20 1355 08/17/20 0700  LIPASE 16 16   Recent Labs  Lab 08/15/20 1342 08/17/20 1145  AMMONIA 45* 75*   Coagulation Profile: No results for input(s): INR, PROTIME in the last 168 hours. CBC: Recent Labs  Lab 08/15/20 1355 08/16/20 0500 08/17/20 0700  WBC 8.3 6.7 4.9  NEUTROABS 6.5  --   --   HGB 10.0* 9.2* 9.5*  HCT 30.6* 28.4* 29.2*  MCV 108.5* 108.8* 107.4*  PLT 159 155 158   Cardiac Enzymes: No results for input(s): CKTOTAL, CKMB, CKMBINDEX, TROPONINI in the last 168 hours. BNP: Invalid input(s): POCBNP CBG: Recent Labs  Lab 08/17/20 1615 08/17/20 2034 08/18/20 0725 08/18/20 1059  08/18/20 1600  GLUCAP 108* 260* 181* 251* 191*   HbA1C: Recent Labs    08/16/20 0500  HGBA1C 9.0*   Urine analysis:    Component Value Date/Time   COLORURINE YELLOW 09/27/2019 1310   APPEARANCEUR TURBID (A) 09/27/2019 1310   LABSPEC 1.013 09/27/2019 1310   PHURINE 7.0 09/27/2019 1310   GLUCOSEU 50 (A) 09/27/2019 1310   HGBUR SMALL (A) 09/27/2019 1310   BILIRUBINUR NEGATIVE 09/27/2019 1310   KETONESUR NEGATIVE 09/27/2019 1310   PROTEINUR >=300 (A) 09/27/2019 1310   UROBILINOGEN 0.2 07/25/2014 2309   NITRITE NEGATIVE 09/27/2019 1310   LEUKOCYTESUR MODERATE (A) 09/27/2019 1310   Sepsis Labs: '@LABRCNTIP'$ (procalcitonin:4,lacticidven:4) ) Recent Results (from the past 240 hour(s))  Resp Panel by RT-PCR (Flu A&B, Covid) Nasopharyngeal Swab     Status: None   Collection Time: 08/15/20  1:37 PM   Specimen:  Nasopharyngeal Swab; Nasopharyngeal(NP) swabs in vial transport medium  Result Value Ref Range Status   SARS Coronavirus 2 by RT PCR NEGATIVE NEGATIVE Final    Comment: (NOTE) SARS-CoV-2 target nucleic acids are NOT DETECTED.  The SARS-CoV-2 RNA is generally detectable in upper respiratory specimens during the acute phase of infection. The lowest concentration of SARS-CoV-2 viral copies this assay can detect is 138 copies/mL. A negative result does not preclude SARS-Cov-2 infection and should not be used as the sole basis for treatment or other patient management decisions. A negative result may occur with  improper specimen collection/handling, submission of specimen other than nasopharyngeal swab, presence of viral mutation(s) within the areas targeted by this assay, and inadequate number of viral copies(<138 copies/mL). A negative result must be combined with clinical observations, patient history, and epidemiological information. The expected result is Negative.  Fact Sheet for Patients:  EntrepreneurPulse.com.au  Fact Sheet for Healthcare Providers:   IncredibleEmployment.be  This test is no t yet approved or cleared by the [place] FDA and  has been authorized for detection and/or diagnosis of SARS-CoV-2 by FDA under an Emergency Use Authorization (EUA). This EUA will remain  in effect (meaning this test can be used) for the duration of the COVID-19 declaration under Section 564(b)(1) of the Act, 21 U.S.C.section 360bbb-3(b)(1), unless the authorization is terminated  or revoked sooner.       Influenza A by PCR NEGATIVE NEGATIVE Final   Influenza B by PCR NEGATIVE NEGATIVE Final    Comment: (NOTE) The Xpert Xpress SARS-CoV-2/FLU/RSV plus assay is intended as an aid in the diagnosis of influenza from Nasopharyngeal swab specimens and should not be used as a sole basis for treatment. Nasal washings and aspirates are unacceptable for Xpert Xpress SARS-CoV-2/FLU/RSV testing.  Fact Sheet for Patients: EntrepreneurPulse.com.au  Fact Sheet for Healthcare Providers: IncredibleEmployment.be  This test is not yet approved or cleared by the [place] FDA and has been authorized for detection and/or diagnosis of SARS-CoV-2 by FDA under an Emergency Use Authorization (EUA). This EUA will remain in effect (meaning this test can be used) for the duration of the COVID-19 declaration under Section 564(b)(1) of the Act, 21 U.S.C. section 360bbb-3(b)(1), unless the authorization is terminated or revoked.  Performed at [facility], [address], [place], [place] [address]   Culture, blood (routine x 2)     Status: None (Preliminary result)   Collection Time: 08/15/20  1:56 PM   Specimen: BLOOD RIGHT ARM  Result Value Ref Range Status   Specimen Description   Final    BLOOD RIGHT ARM BOTTLES DRAWN AEROBIC AND ANAEROBIC   Special Requests Blood Culture adequate volume  Final   Culture   Final    NO GROWTH 3 DAYS Performed at [facility], [address], [place], [place]  [address]    Report Status PENDING  Incomplete  Culture, blood (routine x 2)     Status: None (Preliminary result)   Collection Time: 08/15/20  1:57 PM   Specimen: BLOOD RIGHT HAND  Result Value Ref Range Status   Specimen Description   Final    BLOOD RIGHT HAND BOTTLES DRAWN AEROBIC AND ANAEROBIC   Special Requests Blood Culture adequate volume  Final   Culture   Final    NO GROWTH 3 DAYS Performed at [facility], [address], [place], [place] [address]    Report Status PENDING  Incomplete  Gastrointestinal Panel by PCR , Stool     Status: None  Collection Time: 08/15/20  8:40 PM   Specimen: Stool  Result Value Ref Range Status   Campylobacter species NOT DETECTED NOT DETECTED Final   Plesimonas shigelloides NOT DETECTED NOT DETECTED Final   Salmonella species NOT DETECTED NOT DETECTED Final   Yersinia enterocolitica NOT DETECTED NOT DETECTED Final   Vibrio species NOT DETECTED NOT DETECTED Final   Vibrio cholerae NOT DETECTED NOT DETECTED Final   Enteroaggregative E coli (EAEC) NOT DETECTED NOT DETECTED Final   Enteropathogenic E coli (EPEC) NOT DETECTED NOT DETECTED Final   Enterotoxigenic E coli (ETEC) NOT DETECTED NOT DETECTED Final   Shiga like toxin producing E coli (STEC) NOT DETECTED NOT DETECTED Final   Shigella/Enteroinvasive E coli (EIEC) NOT DETECTED NOT DETECTED Final   Cryptosporidium NOT DETECTED NOT DETECTED Final   Cyclospora cayetanensis NOT DETECTED NOT DETECTED Final   Entamoeba histolytica NOT DETECTED NOT DETECTED Final   Giardia lamblia NOT DETECTED NOT DETECTED Final   Adenovirus F40/41 NOT DETECTED NOT DETECTED Final   Astrovirus NOT DETECTED NOT DETECTED Final   Norovirus GI/GII NOT DETECTED NOT DETECTED Final   Rotavirus A NOT DETECTED NOT DETECTED Final   Sapovirus (I, II, IV, and V) NOT DETECTED NOT DETECTED Final    Comment: Performed at [facility], [address] [place], [place] [address]  C Difficile Quick Screen w PCR reflex      Status: None   Collection Time: 08/15/20  8:40 PM   Specimen: Stool  Result Value Ref Range Status   C Diff antigen NEGATIVE NEGATIVE Final   C Diff toxin NEGATIVE NEGATIVE Final   C Diff interpretation No C. difficile detected.  Final    Comment: Performed at [facility], [address] [place], [place] [address]     Scheduled Meds:  calcitRIOL  0.25 mcg Oral Q T,Th,Sa-HD   carvedilol  6.25 mg Oral BID   Chlorhexidine Gluconate Cloth  6 each Topical Q0600   cinacalcet  30 mg Oral QPM   heparin  5,000 Units Subcutaneous Q8H   hydrALAZINE  25 mg Oral Q8H   insulin aspart  0-5 Units Subcutaneous QHS   insulin aspart  0-6 Units Subcutaneous TID WC   levETIRAcetam  1,000 mg Oral q morning   LORazepam  0.5 mg Oral Once   losartan  25 mg Oral Daily   NIFEdipine  60 mg Oral q morning   pantoprazole  40 mg Oral Daily   Continuous Infusions:  Procedures/Studies: CT Abdomen Pelvis Wo Contrast  Result Date: 08/15/2020 CLINICAL DATA:  Right-sided chest and abdominal pain with diarrhea, initial encounter EXAM: CT CHEST, ABDOMEN AND PELVIS WITHOUT CONTRAST TECHNIQUE: Multidetector CT imaging of the chest, abdomen and pelvis was performed following the standard protocol without IV contrast. COMPARISON:  Chest x-ray from earlier in the same day, 03/21/2020. FINDINGS: CT CHEST FINDINGS Cardiovascular: Limited due to lack of IV contrast. Atherosclerotic calcifications are noted. No cardiac enlargement is seen. No significant coronary calcifications are noted. Pulmonary artery shows no significant enlargement. Mediastinum/Nodes: Thoracic inlet is within normal limits. Scattered small hilar and mediastinal lymph nodes are noted likely reactive in nature. No sizable adenopathy is seen. The esophagus as visualized is within normal limits. Lungs/Pleura: Lungs are well aerated bilaterally. No focal infiltrate or sizable effusion is seen. A few calcified granulomas are noted. Musculoskeletal: Chronic  compression fractures of T10 and T12 are seen. No rib abnormality is noted. CT ABDOMEN PELVIS FINDINGS Hepatobiliary: No focal liver abnormality is seen. No biliary ductal  dilatation is seen. The gallbladder is not well visualized. Pancreas: Diffuse calcifications are noted consistent with chronic pancreatitis. Spleen: Normal in size without focal abnormality. Adrenals/Urinary Tract: Adrenal glands are within normal limits. Kidneys demonstrate scattered small nonobstructing stones in the lower poles bilaterally. No ureteral dilatation is seen. The bladder is partially distended but obscured by scatter artifact Stomach/Bowel: Mild diverticular change of the colon is noted. The colon is predominately decompressed. No small bowel obstructive changes are seen. Stomach is decompressed. Vascular/Lymphatic: Aortic atherosclerosis. No enlarged abdominal or pelvic lymph nodes. Reproductive: Uterus is diffusely calcified similar to that seen on prior exam. Other: Mild ascites is noted somewhat less than that seen on the prior exam. No definitive hernia is seen. Mild changes of anasarca are again noted. Musculoskeletal: Right hip prosthesis is noted. L2 chronic compression deformity is seen. Degenerative changes of lumbar spine are noted. IMPRESSION: CT of the chest: Changes of prior granulomatous disease. CT of the abdomen and pelvis: Mild ascites stable from the prior exam. Some mild changes of anasarca are noted as well. Diverticulosis without diverticulitis. Chronic changes similar to that noted on the prior exam. Aortic Atherosclerosis (ICD10-I70.0). Electronically Signed   By: Inez Catalina M.D.   On: 08/15/2020 16:18   DG Shoulder Right  Result Date: 07/31/2020 CLINICAL DATA:  Fall EXAM: RIGHT SHOULDER - 2+ VIEW COMPARISON:  None. FINDINGS: Postoperative changes in the proximal right humerus with plate and screw fixation device. No acute fracture, subluxation or dislocation. Mild degenerative changes in the [facility] joint.  Glenohumeral joint is maintained. IMPRESSION: No acute bony abnormality. Electronically Signed   By: Rolm Baptise M.D.   On: 07/31/2020 20:50   CT Head Wo Contrast  Result Date: 08/15/2020 CLINICAL DATA:  65 year old female with altered mental status. EXAM: CT HEAD WITHOUT CONTRAST TECHNIQUE: Contiguous axial images were obtained from the base of the skull through the vertex without intravenous contrast. COMPARISON:  Head CT dated 07/31/2020. FINDINGS: Brain: Mild age-related atrophy and chronic microvascular ischemic changes. There is no acute intracranial hemorrhage. No mass effect or midline shift no extra-axial fluid collection. Vascular: No hyperdense vessel or unexpected calcification. Skull: Normal. Negative for fracture or focal lesion. Sinuses/Orbits: No acute finding. Other: None IMPRESSION: 1. No acute intracranial pathology. 2. Mild age-related atrophy and chronic microvascular ischemic changes. Electronically Signed   By: Anner Crete M.D.   On: 08/15/2020 16:13   CT Head Wo Contrast  Result Date: 07/31/2020 CLINICAL DATA:  [place] tonight with trauma to the head and neck EXAM: CT HEAD WITHOUT CONTRAST CT CERVICAL SPINE WITHOUT CONTRAST TECHNIQUE: Multidetector CT imaging of the head and cervical spine was performed following the standard protocol without intravenous contrast. Multiplanar CT image reconstructions of the cervical spine were also generated. COMPARISON:  07/17/2020 FINDINGS: CT HEAD FINDINGS Brain: Generalized atrophy. Mild chronic small-vessel ischemic changes of the white matter. No cortical or large vessel territory infarction. No mass lesion, hemorrhage, hydrocephalus or extra-axial collection. Vascular: There is atherosclerotic calcification of the major vessels at the base of the brain. Skull: Negative Sinuses/Orbits: Clear/normal Other: None CT CERVICAL SPINE FINDINGS Alignment: Straightening of the normal cervical lordosis. Skull base and vertebrae: No fracture or focal  bone lesion. Soft tissues and spinal canal: No soft tissue neck lesion seen. Disc levels: Foramen magnum is widely patent. No significant finding at C1-2. C2-3: Bulging of the disc. No facet arthropathy. No compressive stenosis. C3-4: Chronic central disc herniation which is partially calcified. This effaces the ventral subarachnoid space but does not  visibly compress the cord. No apparent foraminal encroachment. C4-5: Chronic central disc herniation which is partially calcified. Effacement of the ventral subarachnoid space but no compression of the cord. Mild bony foraminal narrowing on the right. C5-6: Annular calcification and bulging slightly more prominent towards the left. No compressive central canal stenosis. Proximal foraminal narrowing on the left. C6-7: Normal interspace. C7-T1: Normal interspace. Upper chest: Negative Other: None IMPRESSION: Head CT: No acute or traumatic finding. Mild atrophy and chronic small-vessel change white matter. Cervical spine CT: No acute or traumatic finding. Chronic disc herniations at C3-4 and C4-5 that effaces the subarachnoid space but do not visibly compress the cord. Mild right foraminal narrowing at C4-5. Degenerative spondylosis at C5-6 with proximal foraminal narrowing on the left. Electronically Signed   By: Nelson Chimes M.D.   On: 07/31/2020 20:43   CT Chest Wo Contrast  Result Date: 08/15/2020 CLINICAL DATA:  Right-sided chest and abdominal pain with diarrhea, initial encounter EXAM: CT CHEST, ABDOMEN AND PELVIS WITHOUT CONTRAST TECHNIQUE: Multidetector CT imaging of the chest, abdomen and pelvis was performed following the standard protocol without IV contrast. COMPARISON:  Chest x-ray from earlier in the same day, 03/21/2020. FINDINGS: CT CHEST FINDINGS Cardiovascular: Limited due to lack of IV contrast. Atherosclerotic calcifications are noted. No cardiac enlargement is seen. No significant coronary calcifications are noted. Pulmonary artery shows no  significant enlargement. Mediastinum/Nodes: Thoracic inlet is within normal limits. Scattered small hilar and mediastinal lymph nodes are noted likely reactive in nature. No sizable adenopathy is seen. The esophagus as visualized is within normal limits. Lungs/Pleura: Lungs are well aerated bilaterally. No focal infiltrate or sizable effusion is seen. A few calcified granulomas are noted. Musculoskeletal: Chronic compression fractures of T10 and T12 are seen. No rib abnormality is noted. CT ABDOMEN PELVIS FINDINGS Hepatobiliary: No focal liver abnormality is seen. No biliary ductal dilatation is seen. The gallbladder is not well visualized. Pancreas: Diffuse calcifications are noted consistent with chronic pancreatitis. Spleen: Normal in size without focal abnormality. Adrenals/Urinary Tract: Adrenal glands are within normal limits. Kidneys demonstrate scattered small nonobstructing stones in the lower poles bilaterally. No ureteral dilatation is seen. The bladder is partially distended but obscured by scatter artifact Stomach/Bowel: Mild diverticular change of the colon is noted. The colon is predominately decompressed. No small bowel obstructive changes are seen. Stomach is decompressed. Vascular/Lymphatic: Aortic atherosclerosis. No enlarged abdominal or pelvic lymph nodes. Reproductive: Uterus is diffusely calcified similar to that seen on prior exam. Other: Mild ascites is noted somewhat less than that seen on the prior exam. No definitive hernia is seen. Mild changes of anasarca are again noted. Musculoskeletal: Right hip prosthesis is noted. L2 chronic compression deformity is seen. Degenerative changes of lumbar spine are noted. IMPRESSION: CT of the chest: Changes of prior granulomatous disease. CT of the abdomen and pelvis: Mild ascites stable from the prior exam. Some mild changes of anasarca are noted as well. Diverticulosis without diverticulitis. Chronic changes similar to that noted on the prior exam.  Aortic Atherosclerosis (ICD10-I70.0). Electronically Signed   By: Inez Catalina M.D.   On: 08/15/2020 16:18   CT Cervical Spine Wo Contrast  Result Date: 07/31/2020 CLINICAL DATA:  [place] tonight with trauma to the head and neck EXAM: CT HEAD WITHOUT CONTRAST CT CERVICAL SPINE WITHOUT CONTRAST TECHNIQUE: Multidetector CT imaging of the head and cervical spine was performed following the standard protocol without intravenous contrast. Multiplanar CT image reconstructions of the cervical spine were also generated. COMPARISON:  07/17/2020 FINDINGS: CT  HEAD FINDINGS Brain: Generalized atrophy. Mild chronic small-vessel ischemic changes of the white matter. No cortical or large vessel territory infarction. No mass lesion, hemorrhage, hydrocephalus or extra-axial collection. Vascular: There is atherosclerotic calcification of the major vessels at the base of the brain. Skull: Negative Sinuses/Orbits: Clear/normal Other: None CT CERVICAL SPINE FINDINGS Alignment: Straightening of the normal cervical lordosis. Skull base and vertebrae: No fracture or focal bone lesion. Soft tissues and spinal canal: No soft tissue neck lesion seen. Disc levels: Foramen magnum is widely patent. No significant finding at C1-2. C2-3: Bulging of the disc. No facet arthropathy. No compressive stenosis. C3-4: Chronic central disc herniation which is partially calcified. This effaces the ventral subarachnoid space but does not visibly compress the cord. No apparent foraminal encroachment. C4-5: Chronic central disc herniation which is partially calcified. Effacement of the ventral subarachnoid space but no compression of the cord. Mild bony foraminal narrowing on the right. C5-6: Annular calcification and bulging slightly more prominent towards the left. No compressive central canal stenosis. Proximal foraminal narrowing on the left. C6-7: Normal interspace. C7-T1: Normal interspace. Upper chest: Negative Other: None IMPRESSION: Head CT: No acute  or traumatic finding. Mild atrophy and chronic small-vessel change white matter. Cervical spine CT: No acute or traumatic finding. Chronic disc herniations at C3-4 and C4-5 that effaces the subarachnoid space but do not visibly compress the cord. Mild right foraminal narrowing at C4-5. Degenerative spondylosis at C5-6 with proximal foraminal narrowing on the left. Electronically Signed   By: Nelson Chimes M.D.   On: 07/31/2020 20:43   MR BRAIN WO CONTRAST  Result Date: 08/16/2020 CLINICAL DATA:  Change to peripheral vision of left eye. EXAM: MRI HEAD WITHOUT CONTRAST TECHNIQUE: Multiplanar, multiecho pulse sequences of the brain and surrounding structures were obtained without intravenous contrast. COMPARISON:  Head CT August 15, 2020. FINDINGS: Brain: No acute infarction, hemorrhage, hydrocephalus, extra-axial collection or mass lesion. Scattered foci of T2 hyperintensity are seen within the white matter the cerebral hemispheres and within the pons, nonspecific, most likely related to chronic small vessel ischemia. 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 Vascular: Normal flow voids. Skull and upper cervical spine: Normal marrow signal noting hyperostosis frontalis interna. Sinuses/Orbits: Bilateral lens surgery. Paranasal sinuses are clear. Other: Mild bilateral mastoid effusion. IMPRESSION: 1. No acute intracranial abnormality. 2. Mild chronic microvascular ischemic changes of the white matter. 3. 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 Electronically Signed   By: Pedro Earls M.D.   On: 08/16/2020 10:46   DG Chest Portable 1 View  Result Date: 08/15/2020 CLINICAL DATA:  Abdominal pain and diarrhea EXAM: PORTABLE CHEST 1 VIEW COMPARISON:  07/17/2020 FINDINGS: Mild elevation right hemidiaphragm with mild right lower lobe atelectasis unchanged. Mild left lower lobe atelectasis has developed since the prior study Heart size upper normal. Negative for heart  failure. No significant pleural effusion. IMPRESSION: Mild bibasilar atelectasis. Electronically Signed   By: Franchot Gallo M.D.   On: 08/15/2020 14:38    Orson Eva, DO  [facility]  If 7PM-7AM, please contact night-coverage [email or web address] Password [email or web address] 08/18/2020, 4:28 PM   LOS: 2 days

## 2020-08-18 NOTE — Plan of Care (Signed)
  Problem: Education: Goal: Knowledge of General Education information will improve Description: Including pain rating scale, medication(s)/side effects and non-pharmacologic comfort measures Outcome: Progressing   Problem: Clinical Measurements: Goal: Will remain free from infection Outcome: Progressing   

## 2020-08-19 LAB — GLUCOSE, CAPILLARY
Glucose-Capillary: 169 mg/dL — ABNORMAL HIGH (ref 70–99)
Glucose-Capillary: 337 mg/dL — ABNORMAL HIGH (ref 70–99)
Glucose-Capillary: 257 mg/dL — ABNORMAL HIGH (ref 70–99)

## 2020-08-19 MED ORDER — CHLORHEXIDINE GLUCONATE CLOTH 2 % EX PADS
6.0000 | MEDICATED_PAD | Freq: Every day | CUTANEOUS | Status: DC
  Administered 2020-08-19 – 2020-08-21 (×3): 6 via TOPICAL

## 2020-08-19 MED ORDER — LOPERAMIDE HCL 2 MG PO CAPS
2.0000 mg | ORAL_CAPSULE | ORAL | Status: DC | PRN
  Administered 2020-08-19: 2 mg via ORAL
  Filled 2020-08-19: qty 1

## 2020-08-19 NOTE — Progress Notes (Signed)
Physical Therapy Treatment Patient Details Name: Erica [name] MRN: [id number] DOB: [birth year] Today's Date: 08/19/2020    History of Present Illness Erica [name] is a 65 y.o. female with medical history significant for ESRD- T Th sat, diastolic CHF, , HTN, diabetes mellitus, alcoholic pancreatitis.  On my evaluation patient is awake alert and oriented x 4 and able to give me a history.  Patient presented to the ED with complaints of vomiting, epigastric abdominal pain and diarrhea, of 2 days duration.  She reports vomitus is clear, and she has had 3 episodes today.  She reports multiple loose stools, and reports she is unable to control her bowel movements.  Since last HD was Saturday-5 days ago.  She did not go because she was feeling ill.  Denies recent antibiotic prescription.  Patient also reports she fell while she was using her walker about a week ago.  Since then she has had problems with vision in her left eye.  Patient reports she makes a little amount of urine.    PT Comments    Pt sitting in chair upon therapist interest and willing to participate.  Pt able to complete transfers and gait training with min guard, cueing for handplacement to assist with standing and to stand within walker.  Pt c/o fatigue following gait.  EOS pt left in chair with call bell within reach and chair alarm set.    Follow Up Recommendations  SNF;Supervision for mobility/OOB;Supervision - Intermittent;Other (comment)     Equipment Recommendations  None recommended by PT    Recommendations for Other Services       Precautions / Restrictions Precautions Precautions: Fall    Mobility  Bed Mobility                    Transfers Overall transfer level: Modified independent Equipment used: Rolling walker (2 wheeled)             General transfer comment: cueing for handplacement  Ambulation/Gait Ambulation/Gait assistance: Min guard Gait Distance (Feet): 24 Feet Assistive  device: Rolling walker (2 wheeled) Gait Pattern/deviations: Decreased step length - left;Decreased step length - right;Decreased stride length;Narrow base of support Gait velocity: decreased   General Gait Details: shuffling like gait pattern, increased time and labored movement   Stairs             Wheelchair Mobility    Modified Rankin (Stroke Patients Only)       Balance                                            Cognition Arousal/Alertness: Awake/alert Behavior During Therapy: WFL for tasks assessed/performed Overall Cognitive Status: Within Functional Limits for tasks assessed                                        Exercises      General Comments        Pertinent Vitals/Pain      Home Living                      Prior Function            PT Goals (current goals can now be found in the care plan section)  Frequency    Min 3X/week      PT Plan      Co-evaluation              AM-PAC PT "6 Clicks" Mobility   Outcome Measure  Help needed turning from your back to your side while in a flat bed without using bedrails?: None Help needed moving from lying on your back to sitting on the side of a flat bed without using bedrails?: None Help needed moving to and from a bed to a chair (including a wheelchair)?: A Little Help needed standing up from a chair using your arms (e.g., wheelchair or bedside chair)?: A Little Help needed to walk in hospital room?: A Little Help needed climbing 3-5 steps with a railing? : A Lot 6 Click Score: 19    End of Session Equipment Utilized During Treatment: Gait belt Activity Tolerance: Patient tolerated treatment well;Patient limited by fatigue Patient left: in chair;with call bell/phone within reach;with chair alarm set Nurse Communication: Mobility status PT Visit Diagnosis: Unsteadiness on feet (R26.81);Other abnormalities of gait and mobility  (R26.89);Muscle weakness (generalized) (M62.81)     Time: [id number] PT Time Calculation (min) (ACUTE ONLY): 15 min  Charges:  $Therapeutic Exercise: 8-22 mins                     Ihor Austin, LPTA/CLT; CBIS [phone]    Aldona Lento 08/19/2020, 2:35 PM

## 2020-08-19 NOTE — Progress Notes (Signed)
PROGRESS NOTE  Erica [name] [id number] DOB: [birth year] DOA: 08/15/2020 PCP: [name], NP   Brief History:  65 year old female with a history of ESRD (TTS), diabetes mellitus type 2, coronary disease, hypertension, hyperlipidemia, B12 deficiency, seizure disorder presenting with 1 week history of upper abdominal pain with associated nausea, vomiting, and diarrhea.  The patient is a difficult historian.  She has some delayed responses and frequently contradicts historical events during the same conversation.  Nevertheless, the patient has been complaining of the above symptoms which have been worsening over the past 2 days.  She has had decreased oral intake and increasing generalized weakness.  Because of the above symptoms, the patient has missed dialysis.  Her last dialysis was on Saturday, 08/10/2020.  She denies any fevers, chills, chest pain, headache, hematemesis, hematochezia, melena.  She states that eating does make her abdominal pain a little worse.  She has had some dyspnea on exertion but denies any chest pain.  She denies any alcohol use or illicit drug use.  She denies any recent antibiotics.  The patient lives with her son, but she states that he has been feeling fine. In the emergency department, the patient was afebrile hemodynamically stable with oxygen saturation 90-100% on room air.  BMP showed a sodium 126, potassium 4.1, bicarbonate 14, serum creatinine 6.17.  WBC 8.3, hemoglobin 10.0, platelets 159,000.   Assessment/Plan: Acute metabolic encephalopathy -Multifactorial including uremia, dehydration, and possible infectious process -Ammonia 45 -Check [id number] -Folic [id number] -CT brain neg -Follow-up MRI brain---no acute findings -TSH--2.298 -7/3 and 7/4--mental status improved   Intractable nausea, vomiting, diarrhea -due to gastroenteritis -C. difficile negative -Stool pathogen panel--neg -continue Protonix daily -GI consult if no  improvement -CT abd--no bowel obstruction or inflammation;  neg pancreatic stranding -08/17/20--n/v much improved -diet advanced which patient is tolerating -loperamide prn   Hyperammonemia -uncleared clinical significance as mental status is improving   Hyponatremia -Secondary to ESRD and missed HD   ESRD -Nephrology consulted -Had HD 08/16/2020 and 08/17/20 -plan HD 7/5 -Continue calcitriol and Sensipar   Chronic diastolic CHF -Appears clinically euvolemic -Fluid removal by dialysis -08/15/20 CT chest--no infiltrates nor pleural effusion   Transaminasemia -This appears to be chronic and likely due to hepatic steatosis -Monitor clinically -Repeat LFTs--stable -no abd pain   Seizure disorder -Continue Keppra   Diabetes mellitus type 2, uncontrolled with hyperglycemia -Hemoglobin A1c--9.0 -NovoLog sliding scale   Essential hypertension -Continue carvedilol, hydralazine, losartan, nifedipine   History of chronic pancreatitis-May be contributing to patient's symptoms.    -Lipase stable at 16 -CT abd--pancreatic calcifications         Status is: Inpatient   The patient will need to be admitted as she will stay>2 midnights due to intolerant to po intake and continued confusion   Dispo: The patient is from: Home              Anticipated d/c is to: Home              Patient currently is not medically stable to d/c.              Difficult to place patient No               Family Communication:  son updated 7/2   Consultants:  renal   Code Status:  FULL   DVT Prophylaxis:  [place] Heparin     Procedures: As Listed in Progress Note Above  Antibiotics: None  Subjective: Patient denies fevers, chills, headache, chest pain, dyspnea, nausea, vomiting, diarrhea, abdominal pain, dysuria, hematuria, hematochezia, and melena.   Objective: Vitals:   08/18/20 2133 08/19/20 0422 08/19/20 0901 08/19/20 1351  BP: (!) 130/57 (!) 169/75 (!) 150/67 (!) 151/71  Pulse: 76  83  79  Resp: 18 18    Temp: 98.3 F (36.8 C) 98 F (36.7 C)  (!) 97.4 F (36.3 C)  TempSrc: Oral Oral  Oral  SpO2: 100% 100%  100%  Weight:      Height:        Intake/Output Summary (Last 24 hours) at 08/19/2020 1517 Last data filed at 08/19/2020 1250 Gross per 24 hour  Intake 1280 ml  Output 2 ml  Net 1278 ml   Weight change:  Exam:  General:  Pt is alert, follows commands appropriately, not in acute distress HEENT: No icterus, No thrush, No neck mass, [place]/AT Cardiovascular: RRR, S1/S2, no rubs, no gallops Respiratory: CTA bilaterally, no wheezing, no crackles, no rhonchi Abdomen: Soft/+BS, non tender, non distended, no guarding Extremities: No edema, No lymphangitis, No petechiae, No rashes, no synovitis   Data Reviewed: I have personally reviewed following labs and imaging studies Basic Metabolic Panel: Recent Labs  Lab 08/15/20 1355 08/15/20 1357 08/16/20 0621 08/17/20 0700  NA 126*  --  129* 128*  K 4.1  --  4.1 4.1  CL 94*  --  95* 92*  CO2 14*  --  19* 25  GLUCOSE 229*  --  97 240*  BUN 70*  --  73* 22  CREATININE 6.17*  --  6.60* 3.14*  CALCIUM 8.1*  --  8.7* 8.0*  MG  --  2.2  --   --    Liver Function Tests: Recent Labs  Lab 08/15/20 1355 08/17/20 0700  AST 42* 36  ALT 65* 45*  ALKPHOS 473* 421*  BILITOT 0.7 0.8  PROT 7.0 6.4*  ALBUMIN 3.3* 2.9*   Recent Labs  Lab 08/15/20 1355 08/17/20 0700  LIPASE 16 16   Recent Labs  Lab 08/15/20 1342 08/17/20 1145  AMMONIA 45* 75*   Coagulation Profile: No results for input(s): INR, PROTIME in the last 168 hours. CBC: Recent Labs  Lab 08/15/20 1355 08/16/20 0500 08/17/20 0700  WBC 8.3 6.7 4.9  NEUTROABS 6.5  --   --   HGB 10.0* 9.2* 9.5*  HCT 30.6* 28.4* 29.2*  MCV 108.5* 108.8* 107.4*  PLT 159 155 158   Cardiac Enzymes: No results for input(s): CKTOTAL, CKMB, CKMBINDEX, TROPONINI in the last 168 hours. BNP: Invalid input(s): POCBNP CBG: Recent Labs  Lab 08/18/20 1059  08/18/20 1600 08/18/20 2128 08/19/20 0728 08/19/20 1106  GLUCAP 251* 191* 197* 169* 337*   HbA1C: No results for input(s): HGBA1C in the last 72 hours. Urine analysis:    Component Value Date/Time   COLORURINE YELLOW 09/27/2019 1310   APPEARANCEUR TURBID (A) 09/27/2019 1310   LABSPEC 1.013 09/27/2019 1310   PHURINE 7.0 09/27/2019 1310   GLUCOSEU 50 (A) 09/27/2019 1310   HGBUR SMALL (A) 09/27/2019 1310   BILIRUBINUR NEGATIVE 09/27/2019 1310   KETONESUR NEGATIVE 09/27/2019 1310   PROTEINUR >=300 (A) 09/27/2019 1310   UROBILINOGEN 0.2 07/25/2014 2309   NITRITE NEGATIVE 09/27/2019 1310   LEUKOCYTESUR MODERATE (A) 09/27/2019 1310   Sepsis Labs: '@LABRCNTIP'$ (procalcitonin:4,lacticidven:4) ) Recent Results (from the past 240 hour(s))  Resp Panel by RT-PCR (Flu A&B, Covid) Nasopharyngeal Swab     Status: None   Collection Time: 08/15/20  1:37 PM   Specimen: Nasopharyngeal Swab; Nasopharyngeal(NP) swabs in vial transport medium  Result Value Ref Range Status   SARS Coronavirus 2 by RT PCR NEGATIVE NEGATIVE Final    Comment: (NOTE) SARS-CoV-2 target nucleic acids are NOT DETECTED.  The SARS-CoV-2 RNA is generally detectable in upper respiratory specimens during the acute phase of infection. The lowest concentration of SARS-CoV-2 viral copies this assay can detect is 138 copies/mL. A negative result does not preclude SARS-Cov-2 infection and should not be used as the sole basis for treatment or other patient management decisions. A negative result may occur with  improper specimen collection/handling, submission of specimen other than nasopharyngeal swab, presence of viral mutation(s) within the areas targeted by this assay, and inadequate number of viral copies(<138 copies/mL). A negative result must be combined with clinical observations, patient history, and epidemiological information. The expected result is Negative.  Fact Sheet for Patients:   EntrepreneurPulse.com.au  Fact Sheet for Healthcare Providers:  IncredibleEmployment.be  This test is no t yet approved or cleared by the [place] FDA and  has been authorized for detection and/or diagnosis of SARS-CoV-2 by FDA under an Emergency Use Authorization (EUA). This EUA will remain  in effect (meaning this test can be used) for the duration of the COVID-19 declaration under Section 564(b)(1) of the Act, 21 U.S.C.section 360bbb-3(b)(1), unless the authorization is terminated  or revoked sooner.       Influenza A by PCR NEGATIVE NEGATIVE Final   Influenza B by PCR NEGATIVE NEGATIVE Final    Comment: (NOTE) The Xpert Xpress SARS-CoV-2/FLU/RSV plus assay is intended as an aid in the diagnosis of influenza from Nasopharyngeal swab specimens and should not be used as a sole basis for treatment. Nasal washings and aspirates are unacceptable for Xpert Xpress SARS-CoV-2/FLU/RSV testing.  Fact Sheet for Patients: EntrepreneurPulse.com.au  Fact Sheet for Healthcare Providers: IncredibleEmployment.be  This test is not yet approved or cleared by the [place] FDA and has been authorized for detection and/or diagnosis of SARS-CoV-2 by FDA under an Emergency Use Authorization (EUA). This EUA will remain in effect (meaning this test can be used) for the duration of the COVID-19 declaration under Section 564(b)(1) of the Act, 21 U.S.C. section 360bbb-3(b)(1), unless the authorization is terminated or revoked.  Performed at [facility], [address] [place], [place] [address]   Culture, blood (routine x 2)     Status: None (Preliminary result)   Collection Time: 08/15/20  1:56 PM   Specimen: BLOOD RIGHT ARM  Result Value Ref Range Status   Specimen Description   Final    BLOOD RIGHT ARM BOTTLES DRAWN AEROBIC AND ANAEROBIC   Special Requests Blood Culture adequate volume  Final   Culture   Final     NO GROWTH 3 DAYS Performed at [facility], [address] [place], [place] [address]    Report Status PENDING  Incomplete  Culture, blood (routine x 2)     Status: None (Preliminary result)   Collection Time: 08/15/20  1:57 PM   Specimen: BLOOD RIGHT HAND  Result Value Ref Range Status   Specimen Description   Final    BLOOD RIGHT HAND BOTTLES DRAWN AEROBIC AND ANAEROBIC   Special Requests Blood Culture adequate volume  Final   Culture   Final    NO GROWTH 3 DAYS Performed at [facility], [address], [place], [place] [address]    Report Status PENDING  Incomplete  Gastrointestinal Panel by PCR , Stool  Status: None   Collection Time: 08/15/20  8:40 PM   Specimen: Stool  Result Value Ref Range Status   Campylobacter species NOT DETECTED NOT DETECTED Final   Plesimonas shigelloides NOT DETECTED NOT DETECTED Final   Salmonella species NOT DETECTED NOT DETECTED Final   Yersinia enterocolitica NOT DETECTED NOT DETECTED Final   Vibrio species NOT DETECTED NOT DETECTED Final   Vibrio cholerae NOT DETECTED NOT DETECTED Final   Enteroaggregative E coli (EAEC) NOT DETECTED NOT DETECTED Final   Enteropathogenic E coli (EPEC) NOT DETECTED NOT DETECTED Final   Enterotoxigenic E coli (ETEC) NOT DETECTED NOT DETECTED Final   Shiga like toxin producing E coli (STEC) NOT DETECTED NOT DETECTED Final   Shigella/Enteroinvasive E coli (EIEC) NOT DETECTED NOT DETECTED Final   Cryptosporidium NOT DETECTED NOT DETECTED Final   Cyclospora cayetanensis NOT DETECTED NOT DETECTED Final   Entamoeba histolytica NOT DETECTED NOT DETECTED Final   Giardia lamblia NOT DETECTED NOT DETECTED Final   Adenovirus F40/41 NOT DETECTED NOT DETECTED Final   Astrovirus NOT DETECTED NOT DETECTED Final   Norovirus GI/GII NOT DETECTED NOT DETECTED Final   Rotavirus A NOT DETECTED NOT DETECTED Final   Sapovirus (I, II, IV, and V) NOT DETECTED NOT DETECTED Final    Comment: Performed at [facility],  [address] [place], [place] [address]  C Difficile Quick Screen w PCR reflex     Status: None   Collection Time: 08/15/20  8:40 PM   Specimen: Stool  Result Value Ref Range Status   C Diff antigen NEGATIVE NEGATIVE Final   C Diff toxin NEGATIVE NEGATIVE Final   C Diff interpretation No C. difficile detected.  Final    Comment: Performed at [facility], [address] [place], [place] [address]     Scheduled Meds:  calcitRIOL  0.25 mcg Oral Q T,Th,Sa-HD   carvedilol  6.25 mg Oral BID   Chlorhexidine Gluconate Cloth  6 each Topical Q0600   Chlorhexidine Gluconate Cloth  6 each Topical Q0600   cinacalcet  30 mg Oral QPM   heparin  5,000 Units Subcutaneous Q8H   hydrALAZINE  25 mg Oral Q8H   insulin aspart  0-5 Units Subcutaneous QHS   insulin aspart  0-6 Units Subcutaneous TID WC   levETIRAcetam  1,000 mg Oral q morning   LORazepam  0.5 mg Oral Once   losartan  25 mg Oral Daily   NIFEdipine  60 mg Oral q morning   pantoprazole  40 mg Oral Daily   Continuous Infusions:  Procedures/Studies: CT Abdomen Pelvis Wo Contrast  Result Date: 08/15/2020 CLINICAL DATA:  Right-sided chest and abdominal pain with diarrhea, initial encounter EXAM: CT CHEST, ABDOMEN AND PELVIS WITHOUT CONTRAST TECHNIQUE: Multidetector CT imaging of the chest, abdomen and pelvis was performed following the standard protocol without IV contrast. COMPARISON:  Chest x-ray from earlier in the same day, 03/21/2020. FINDINGS: CT CHEST FINDINGS Cardiovascular: Limited due to lack of IV contrast. Atherosclerotic calcifications are noted. No cardiac enlargement is seen. No significant coronary calcifications are noted. Pulmonary artery shows no significant enlargement. Mediastinum/Nodes: Thoracic inlet is within normal limits. Scattered small hilar and mediastinal lymph nodes are noted likely reactive in nature. No sizable adenopathy is seen. The esophagus as visualized is within normal limits. Lungs/Pleura: Lungs are well  aerated bilaterally. No focal infiltrate or sizable effusion is seen. A few calcified granulomas are noted. Musculoskeletal: Chronic compression fractures of T10 and T12 are seen. No rib abnormality is noted.  CT ABDOMEN PELVIS FINDINGS Hepatobiliary: No focal liver abnormality is seen. No biliary ductal dilatation is seen. The gallbladder is not well visualized. Pancreas: Diffuse calcifications are noted consistent with chronic pancreatitis. Spleen: Normal in size without focal abnormality. Adrenals/Urinary Tract: Adrenal glands are within normal limits. Kidneys demonstrate scattered small nonobstructing stones in the lower poles bilaterally. No ureteral dilatation is seen. The bladder is partially distended but obscured by scatter artifact Stomach/Bowel: Mild diverticular change of the colon is noted. The colon is predominately decompressed. No small bowel obstructive changes are seen. Stomach is decompressed. Vascular/Lymphatic: Aortic atherosclerosis. No enlarged abdominal or pelvic lymph nodes. Reproductive: Uterus is diffusely calcified similar to that seen on prior exam. Other: Mild ascites is noted somewhat less than that seen on the prior exam. No definitive hernia is seen. Mild changes of anasarca are again noted. Musculoskeletal: Right hip prosthesis is noted. L2 chronic compression deformity is seen. Degenerative changes of lumbar spine are noted. IMPRESSION: CT of the chest: Changes of prior granulomatous disease. CT of the abdomen and pelvis: Mild ascites stable from the prior exam. Some mild changes of anasarca are noted as well. Diverticulosis without diverticulitis. Chronic changes similar to that noted on the prior exam. Aortic Atherosclerosis (ICD10-I70.0). Electronically Signed   By: Inez Catalina M.D.   On: 08/15/2020 16:18   DG Shoulder Right  Result Date: 07/31/2020 CLINICAL DATA:  Fall EXAM: RIGHT SHOULDER - 2+ VIEW COMPARISON:  None. FINDINGS: Postoperative changes in the proximal right  humerus with plate and screw fixation device. No acute fracture, subluxation or dislocation. Mild degenerative changes in the [facility] joint. Glenohumeral joint is maintained. IMPRESSION: No acute bony abnormality. Electronically Signed   By: Rolm Baptise M.D.   On: 07/31/2020 20:50   CT Head Wo Contrast  Result Date: 08/15/2020 CLINICAL DATA:  65 year old female with altered mental status. EXAM: CT HEAD WITHOUT CONTRAST TECHNIQUE: Contiguous axial images were obtained from the base of the skull through the vertex without intravenous contrast. COMPARISON:  Head CT dated 07/31/2020. FINDINGS: Brain: Mild age-related atrophy and chronic microvascular ischemic changes. There is no acute intracranial hemorrhage. No mass effect or midline shift no extra-axial fluid collection. Vascular: No hyperdense vessel or unexpected calcification. Skull: Normal. Negative for fracture or focal lesion. Sinuses/Orbits: No acute finding. Other: None IMPRESSION: 1. No acute intracranial pathology. 2. Mild age-related atrophy and chronic microvascular ischemic changes. Electronically Signed   By: Anner Crete M.D.   On: 08/15/2020 16:13   CT Head Wo Contrast  Result Date: 07/31/2020 CLINICAL DATA:  [place] tonight with trauma to the head and neck EXAM: CT HEAD WITHOUT CONTRAST CT CERVICAL SPINE WITHOUT CONTRAST TECHNIQUE: Multidetector CT imaging of the head and cervical spine was performed following the standard protocol without intravenous contrast. Multiplanar CT image reconstructions of the cervical spine were also generated. COMPARISON:  07/17/2020 FINDINGS: CT HEAD FINDINGS Brain: Generalized atrophy. Mild chronic small-vessel ischemic changes of the white matter. No cortical or large vessel territory infarction. No mass lesion, hemorrhage, hydrocephalus or extra-axial collection. Vascular: There is atherosclerotic calcification of the major vessels at the base of the brain. Skull: Negative Sinuses/Orbits: Clear/normal Other:  None CT CERVICAL SPINE FINDINGS Alignment: Straightening of the normal cervical lordosis. Skull base and vertebrae: No fracture or focal bone lesion. Soft tissues and spinal canal: No soft tissue neck lesion seen. Disc levels: Foramen magnum is widely patent. No significant finding at C1-2. C2-3: Bulging of the disc. No facet arthropathy. No compressive stenosis. C3-4: Chronic central disc  herniation which is partially calcified. This effaces the ventral subarachnoid space but does not visibly compress the cord. No apparent foraminal encroachment. C4-5: Chronic central disc herniation which is partially calcified. Effacement of the ventral subarachnoid space but no compression of the cord. Mild bony foraminal narrowing on the right. C5-6: Annular calcification and bulging slightly more prominent towards the left. No compressive central canal stenosis. Proximal foraminal narrowing on the left. C6-7: Normal interspace. C7-T1: Normal interspace. Upper chest: Negative Other: None IMPRESSION: Head CT: No acute or traumatic finding. Mild atrophy and chronic small-vessel change white matter. Cervical spine CT: No acute or traumatic finding. Chronic disc herniations at C3-4 and C4-5 that effaces the subarachnoid space but do not visibly compress the cord. Mild right foraminal narrowing at C4-5. Degenerative spondylosis at C5-6 with proximal foraminal narrowing on the left. Electronically Signed   By: Nelson Chimes M.D.   On: 07/31/2020 20:43   CT Chest Wo Contrast  Result Date: 08/15/2020 CLINICAL DATA:  Right-sided chest and abdominal pain with diarrhea, initial encounter EXAM: CT CHEST, ABDOMEN AND PELVIS WITHOUT CONTRAST TECHNIQUE: Multidetector CT imaging of the chest, abdomen and pelvis was performed following the standard protocol without IV contrast. COMPARISON:  Chest x-ray from earlier in the same day, 03/21/2020. FINDINGS: CT CHEST FINDINGS Cardiovascular: Limited due to lack of IV contrast. Atherosclerotic  calcifications are noted. No cardiac enlargement is seen. No significant coronary calcifications are noted. Pulmonary artery shows no significant enlargement. Mediastinum/Nodes: Thoracic inlet is within normal limits. Scattered small hilar and mediastinal lymph nodes are noted likely reactive in nature. No sizable adenopathy is seen. The esophagus as visualized is within normal limits. Lungs/Pleura: Lungs are well aerated bilaterally. No focal infiltrate or sizable effusion is seen. A few calcified granulomas are noted. Musculoskeletal: Chronic compression fractures of T10 and T12 are seen. No rib abnormality is noted. CT ABDOMEN PELVIS FINDINGS Hepatobiliary: No focal liver abnormality is seen. No biliary ductal dilatation is seen. The gallbladder is not well visualized. Pancreas: Diffuse calcifications are noted consistent with chronic pancreatitis. Spleen: Normal in size without focal abnormality. Adrenals/Urinary Tract: Adrenal glands are within normal limits. Kidneys demonstrate scattered small nonobstructing stones in the lower poles bilaterally. No ureteral dilatation is seen. The bladder is partially distended but obscured by scatter artifact Stomach/Bowel: Mild diverticular change of the colon is noted. The colon is predominately decompressed. No small bowel obstructive changes are seen. Stomach is decompressed. Vascular/Lymphatic: Aortic atherosclerosis. No enlarged abdominal or pelvic lymph nodes. Reproductive: Uterus is diffusely calcified similar to that seen on prior exam. Other: Mild ascites is noted somewhat less than that seen on the prior exam. No definitive hernia is seen. Mild changes of anasarca are again noted. Musculoskeletal: Right hip prosthesis is noted. L2 chronic compression deformity is seen. Degenerative changes of lumbar spine are noted. IMPRESSION: CT of the chest: Changes of prior granulomatous disease. CT of the abdomen and pelvis: Mild ascites stable from the prior exam. Some mild  changes of anasarca are noted as well. Diverticulosis without diverticulitis. Chronic changes similar to that noted on the prior exam. Aortic Atherosclerosis (ICD10-I70.0). Electronically Signed   By: Inez Catalina M.D.   On: 08/15/2020 16:18   CT Cervical Spine Wo Contrast  Result Date: 07/31/2020 CLINICAL DATA:  [place] tonight with trauma to the head and neck EXAM: CT HEAD WITHOUT CONTRAST CT CERVICAL SPINE WITHOUT CONTRAST TECHNIQUE: Multidetector CT imaging of the head and cervical spine was performed following the standard protocol without intravenous contrast. Multiplanar CT  image reconstructions of the cervical spine were also generated. COMPARISON:  07/17/2020 FINDINGS: CT HEAD FINDINGS Brain: Generalized atrophy. Mild chronic small-vessel ischemic changes of the white matter. No cortical or large vessel territory infarction. No mass lesion, hemorrhage, hydrocephalus or extra-axial collection. Vascular: There is atherosclerotic calcification of the major vessels at the base of the brain. Skull: Negative Sinuses/Orbits: Clear/normal Other: None CT CERVICAL SPINE FINDINGS Alignment: Straightening of the normal cervical lordosis. Skull base and vertebrae: No fracture or focal bone lesion. Soft tissues and spinal canal: No soft tissue neck lesion seen. Disc levels: Foramen magnum is widely patent. No significant finding at C1-2. C2-3: Bulging of the disc. No facet arthropathy. No compressive stenosis. C3-4: Chronic central disc herniation which is partially calcified. This effaces the ventral subarachnoid space but does not visibly compress the cord. No apparent foraminal encroachment. C4-5: Chronic central disc herniation which is partially calcified. Effacement of the ventral subarachnoid space but no compression of the cord. Mild bony foraminal narrowing on the right. C5-6: Annular calcification and bulging slightly more prominent towards the left. No compressive central canal stenosis. Proximal foraminal  narrowing on the left. C6-7: Normal interspace. C7-T1: Normal interspace. Upper chest: Negative Other: None IMPRESSION: Head CT: No acute or traumatic finding. Mild atrophy and chronic small-vessel change white matter. Cervical spine CT: No acute or traumatic finding. Chronic disc herniations at C3-4 and C4-5 that effaces the subarachnoid space but do not visibly compress the cord. Mild right foraminal narrowing at C4-5. Degenerative spondylosis at C5-6 with proximal foraminal narrowing on the left. Electronically Signed   By: Nelson Chimes M.D.   On: 07/31/2020 20:43   MR BRAIN WO CONTRAST  Result Date: 08/16/2020 CLINICAL DATA:  Change to peripheral vision of left eye. EXAM: MRI HEAD WITHOUT CONTRAST TECHNIQUE: Multiplanar, multiecho pulse sequences of the brain and surrounding structures were obtained without intravenous contrast. COMPARISON:  Head CT August 15, 2020. FINDINGS: Brain: No acute infarction, hemorrhage, hydrocephalus, extra-axial collection or mass lesion. Scattered foci of T2 hyperintensity are seen within the white matter the cerebral hemispheres and within the pons, nonspecific, most likely related to chronic small vessel ischemia. 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 Vascular: Normal flow voids. Skull and upper cervical spine: Normal marrow signal noting hyperostosis frontalis interna. Sinuses/Orbits: Bilateral lens surgery. Paranasal sinuses are clear. Other: Mild bilateral mastoid effusion. IMPRESSION: 1. No acute intracranial abnormality. 2. Mild chronic microvascular ischemic changes of the white matter. 3. 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 Electronically Signed   By: Pedro Earls M.D.   On: 08/16/2020 10:46   DG Chest Portable 1 View  Result Date: 08/15/2020 CLINICAL DATA:  Abdominal pain and diarrhea EXAM: PORTABLE CHEST 1 VIEW COMPARISON:  07/17/2020 FINDINGS: Mild elevation right hemidiaphragm with mild right lower lobe  atelectasis unchanged. Mild left lower lobe atelectasis has developed since the prior study Heart size upper normal. Negative for heart failure. No significant pleural effusion. IMPRESSION: Mild bibasilar atelectasis. Electronically Signed   By: Franchot Gallo M.D.   On: 08/15/2020 14:38    Orson Eva, DO  [facility]  If 7PM-7AM, please contact night-coverage [email or web address] Password [email or web address] 08/19/2020, 3:17 PM   LOS: 3 days

## 2020-08-19 NOTE — Progress Notes (Signed)
[facility] NEPHROLOGY PROGRESS NOTE  Assessment/ Plan: Pt is a 65 y.o. yo female with history of DM, HTN, diastolic CHF, chronic pancreatitis, ESRD on HD TTS at [facility] presented with diarrhea, abdominal pain seen as a consultation for the management of ESRD.  OUTPATIENT DIALYSIS:  [facility]  on TTS EDW 42.5kg, Time: 3.75 hours, 2K/ 3 Ca, BFR 400, DFR 600,  Dialyzer: Gambro Revacir 200, LUE AVG Heparin 1000 unit load, 500 units/hr (stop 60 min before end of HD)  #Gastroenteritis: C differential and GI pathogen negative.  Clinically improved. Continue supportive management.  # ESRD: last  HD 7/2 and tolerated well. Plan for next HD tomorrow as per TTS schedule.    # Anemia: Continue ESA.  Monitor hemoglobin.  # Secondary hyperparathyroidism: Currently on calcitriol and Cinacalcet.  Monitor calcium and phosphorus.  # HTN/volume: On losartan.  UF as tolerated by HD.  #Hyponatremia, hypervolemic: Managed with dialysis.  Discussed with the dialysis nurse and primary team.  Subjective: Seen and examined in dialysis unit.  She reports feeling better.  Denies nausea vomiting chest pain shortness of breath.  She is alert awake and oriented. Objective Vital signs in last 24 hours: Vitals:   08/18/20 1443 08/18/20 2133 08/19/20 0422 08/19/20 0901  BP: (!) 124/55 (!) 130/57 (!) 169/75 (!) 150/67  Pulse: 70 76 83   Resp:  18 18   Temp: 98.9 F (37.2 C) 98.3 F (36.8 C) 98 F (36.7 C)   TempSrc: Oral Oral Oral   SpO2: 100% 100% 100%   Weight:      Height:       Weight change:   Intake/Output Summary (Last 24 hours) at 08/19/2020 1014 Last data filed at 08/19/2020 0854 Gross per 24 hour  Intake 1720 ml  Output 2 ml  Net 1718 ml        Labs: Basic Metabolic Panel: Recent Labs  Lab 08/15/20 1355 08/16/20 0621 08/17/20 0700  NA 126* 129* 128*  K 4.1 4.1 4.1  CL 94* 95* 92*  CO2 14* 19* 25  GLUCOSE 229* 97 240*  BUN 70* 73* 22  CREATININE 6.17* 6.60*  3.14*  CALCIUM 8.1* 8.7* 8.0*    Liver Function Tests: Recent Labs  Lab 08/15/20 1355 08/17/20 0700  AST 42* 36  ALT 65* 45*  ALKPHOS 473* 421*  BILITOT 0.7 0.8  PROT 7.0 6.4*  ALBUMIN 3.3* 2.9*    Recent Labs  Lab 08/15/20 1355 08/17/20 0700  LIPASE 16 16    Recent Labs  Lab 08/15/20 1342 08/17/20 1145  AMMONIA 45* 75*    CBC: Recent Labs  Lab 08/15/20 1355 08/16/20 0500 08/17/20 0700  WBC 8.3 6.7 4.9  NEUTROABS 6.5  --   --   HGB 10.0* 9.2* 9.5*  HCT 30.6* 28.4* 29.2*  MCV 108.5* 108.8* 107.4*  PLT 159 155 158    Cardiac Enzymes: No results for input(s): CKTOTAL, CKMB, CKMBINDEX, TROPONINI in the last 168 hours. CBG: Recent Labs  Lab 08/18/20 0725 08/18/20 1059 08/18/20 1600 08/18/20 2128 08/19/20 0728  GLUCAP 181* 251* 191* 197* 169*     Iron Studies: No results for input(s): IRON, TIBC, TRANSFERRIN, FERRITIN in the last 72 hours. Studies/Results: No results found.  Medications: Infusions:   Scheduled Medications:  calcitRIOL  0.25 mcg Oral Q T,Th,Sa-HD   carvedilol  6.25 mg Oral BID   Chlorhexidine Gluconate Cloth  6 each Topical Q0600   cinacalcet  30 mg Oral QPM   heparin  5,000 Units Subcutaneous Q8H   hydrALAZINE  25 mg Oral Q8H   insulin aspart  0-5 Units Subcutaneous QHS   insulin aspart  0-6 Units Subcutaneous TID WC   levETIRAcetam  1,000 mg Oral q morning   LORazepam  0.5 mg Oral Once   losartan  25 mg Oral Daily   NIFEdipine  60 mg Oral q morning   pantoprazole  40 mg Oral Daily    have reviewed scheduled and prn medications.  Physical Exam: General:NAD, comfortable Heart:RRR, s1s2 nl Lungs:clear b/l, no crackle Abdomen:soft, Non-tender, non-distended Extremities:No edema Dialysis Access: Left AV graft.  [name] [name] 08/19/2020,10:14 AM  LOS: 3 days

## 2020-08-20 DIAGNOSIS — R4182 Altered mental status, unspecified: Secondary | ICD-10-CM

## 2020-08-20 LAB — RENAL FUNCTION PANEL
Albumin: 3.2 g/dL — ABNORMAL LOW (ref 3.5–5.0)
Anion gap: 8 (ref 5–15)
BUN: 29 mg/dL — ABNORMAL HIGH (ref 8–23)
CO2: 19 mmol/L — ABNORMAL LOW (ref 22–32)
Calcium: 7.7 mg/dL — ABNORMAL LOW (ref 8.9–10.3)
Chloride: 94 mmol/L — ABNORMAL LOW (ref 98–111)
Creatinine, Ser: 4.35 mg/dL — ABNORMAL HIGH (ref 0.44–1.00)
GFR, Estimated: 11 mL/min — ABNORMAL LOW (ref >60)
Glucose, Bld: 130 mg/dL — ABNORMAL HIGH (ref 70–99)
Phosphorus: 3 mg/dL (ref 2.5–4.6)
Potassium: >7.5 mmol/L (ref 3.5–5.1)
Sodium: 121 mmol/L — ABNORMAL LOW (ref 135–145)

## 2020-08-20 LAB — BASIC METABOLIC PANEL
Anion gap: 8 (ref 5–15)
BUN: 28 mg/dL — ABNORMAL HIGH (ref 8–23)
CO2: 18 mmol/L — ABNORMAL LOW (ref 22–32)
Calcium: 7.5 mg/dL — ABNORMAL LOW (ref 8.9–10.3)
Chloride: 95 mmol/L — ABNORMAL LOW (ref 98–111)
Creatinine, Ser: 3.97 mg/dL — ABNORMAL HIGH (ref 0.44–1.00)
GFR, Estimated: 12 mL/min — ABNORMAL LOW (ref >60)
Glucose, Bld: 214 mg/dL — ABNORMAL HIGH (ref 70–99)
Potassium: 7.5 mmol/L (ref 3.5–5.1)
Sodium: 121 mmol/L — ABNORMAL LOW (ref 135–145)

## 2020-08-20 LAB — CBC
HCT: 30.4 % — ABNORMAL LOW (ref 36.0–46.0)
Hemoglobin: 9.6 g/dL — ABNORMAL LOW (ref 12.0–15.0)
MCH: 34.4 pg — ABNORMAL HIGH (ref 26.0–34.0)
MCHC: 31.6 g/dL (ref 30.0–36.0)
MCV: 109 fL — ABNORMAL HIGH (ref 80.0–100.0)
Platelets: 199 10*3/uL (ref 150–400)
RBC: 2.79 MIL/uL — ABNORMAL LOW (ref 3.87–5.11)
RDW: 12.5 % (ref 11.5–15.5)
WBC: 7.9 10*3/uL (ref 4.0–10.5)
nRBC: 0.3 % — ABNORMAL HIGH (ref 0.0–0.2)

## 2020-08-20 LAB — POTASSIUM: Potassium: 7 mmol/L (ref 3.5–5.1)

## 2020-08-20 LAB — GLUCOSE, CAPILLARY
Glucose-Capillary: 358 mg/dL — ABNORMAL HIGH (ref 70–99)
Glucose-Capillary: 128 mg/dL — ABNORMAL HIGH (ref 70–99)
Glucose-Capillary: 411 mg/dL — ABNORMAL HIGH (ref 70–99)
Glucose-Capillary: 289 mg/dL — ABNORMAL HIGH (ref 70–99)
Glucose-Capillary: 72 mg/dL (ref 70–99)
Glucose-Capillary: 198 mg/dL — ABNORMAL HIGH (ref 70–99)

## 2020-08-20 MED ORDER — DEXTROSE 50 % IV SOLN
1.0000 | Freq: Once | INTRAVENOUS | Status: AC
  Administered 2020-08-20: 50 mL via INTRAVENOUS
  Filled 2020-08-20: qty 50

## 2020-08-20 MED ORDER — INSULIN ASPART 100 UNIT/ML IV SOLN
5.0000 [IU] | Freq: Once | INTRAVENOUS | Status: AC
  Administered 2020-08-20: 5 [IU] via INTRAVENOUS

## 2020-08-20 MED ORDER — SODIUM ZIRCONIUM CYCLOSILICATE 10 G PO PACK
10.0000 g | PACK | Freq: Once | ORAL | Status: AC
  Administered 2020-08-20: 10 g via ORAL
  Filled 2020-08-20: qty 1

## 2020-08-20 MED ORDER — HEPARIN SODIUM (PORCINE) 1000 UNIT/ML DIALYSIS: 20.0000 [IU]/kg | INTRAMUSCULAR | Status: DC | PRN

## 2020-08-20 MED ORDER — SODIUM BICARBONATE 8.4 % IV SOLN
INTRAVENOUS | Status: AC
  Filled 2020-08-20: qty 50

## 2020-08-20 MED ORDER — CALCIUM GLUCONATE-NACL 1-0.675 GM/50ML-% IV SOLN
1.0000 g | Freq: Once | INTRAVENOUS | Status: AC
  Administered 2020-08-20: 1000 mg via INTRAVENOUS
  Filled 2020-08-20: qty 50

## 2020-08-20 MED ORDER — SODIUM BICARBONATE 8.4 % IV SOLN
50.0000 meq | Freq: Once | INTRAVENOUS | Status: AC
  Administered 2020-08-20: 50 meq via INTRAVENOUS

## 2020-08-20 NOTE — Procedures (Signed)
   HEMODIALYSIS TREATMENT NOTE:  Uneventful 3.5 hour low-heparin HD completed via left upper arm AVG (15g). 1K dialysate used for first hour of HD for hyperkalemia.   Goal met: 1 liter removed without interruption in ultrafiltration.  All blood was returned and hemostasis was achieved in 15 minutes.  No changes from pre-HD assessment.  Rockwell Alexandria, RN

## 2020-08-20 NOTE — TOC Transition Note (Signed)
Transition of Care [facility]) - CM/SW Discharge Note   Patient Details  Name: Erica [name] MRN: [id number] Date of Birth: 08-Feb-1956  [facility] CM/SW Contact:  Boneta Lucks, RN Phone Number: 08/20/2020, 11:25 AM   Clinical Narrative:  Patient is medically ready to transport to [facility]. Confirm with Melissa they are ready. Patient has 11am chair time TTSat.  TOC updated her son, [name]. RN to call report. TOC will call EMS when ready.  Final next level of care: Skilled Nursing Facility Barriers to Discharge: Barriers Resolved  Patient Goals and CMS Choice Patient states their goals for this hospitalization and ongoing recovery are:: to go to SNF CMS Medicare.gov Compare Post Acute Care list provided to:: Patient Represenative (must comment) Choice offered to / list presented to : Adult Children  Discharge Placement              Patient chooses bed at:  [facility]) Patient to be transferred to facility by: EMS Name of family member notified: [name] - son Patient and family notified of of transfer: 08/20/20  Discharge Plan and Services    Readmission Risk Interventions Readmission Risk Prevention Plan 08/20/2020 06/11/2020 11/19/2019  Transportation Screening Complete Complete Complete  Medication Review Press photographer) Complete Complete Complete  PCP or Specialist appointment within 3-5 days of discharge Complete - Complete  HRI or Home Care Consult Complete Complete Complete  SW Recovery Care/Counseling Consult Complete Complete Complete  Palliative Care Screening Not Applicable Not Applicable Complete  Skilled Nursing Facility Complete Complete Complete  Some recent data might be hidden

## 2020-08-20 NOTE — Progress Notes (Signed)
CRITICAL VALUE STICKER  CRITICAL VALUE: Potassium 7.5  RECEIVER (on-site recipient of call): Nayanna Seaborn Josaphine Shimamoto  DATE & TIME NOTIFIED: 7/5 @ 9:30  MESSENGER (representative from lab):  MD NOTIFIED: TAT  TIME OF NOTIFICATION: 0945  RESPONSE: New orders

## 2020-08-20 NOTE — Discharge Summary (Signed)
Physician Discharge Summary  Erica [name] [id number] DOB: 22-Aug-1955 DOA: 08/15/2020  PCP: [name], NP  Admit date: 08/15/2020 Discharge date: 08/20/2020  Admitted From: Home Disposition:  SNF  Recommendations for Outpatient Follow-up:  Follow up with PCP in 1-2 weeks Please obtain BMP/CBC in one week  Discharge Condition: Stable CODE STATUS: FULL Diet recommendation:  Renal   Brief/Interim Summary: 65 year old female with a history of ESRD (TTS), diabetes mellitus type 2, coronary disease, hypertension, hyperlipidemia, B12 deficiency, seizure disorder presenting with 1 week history of upper abdominal pain with associated nausea, vomiting, and diarrhea.  The patient is a difficult historian.  She has some delayed responses and frequently contradicts historical events during the same conversation.  Nevertheless, the patient has been complaining of the above symptoms which have been worsening over the past 2 days.  She has had decreased oral intake and increasing generalized weakness.  Because of the above symptoms, the patient has missed dialysis.  Her last dialysis was on Saturday, 08/10/2020.  She denies any fevers, chills, chest pain, headache, hematemesis, hematochezia, melena.  She states that eating does make her abdominal pain a little worse.  She has had some dyspnea on exertion but denies any chest pain.  She denies any alcohol use or illicit drug use.  She denies any recent antibiotics.  The patient lives with her son, but she states that he has been feeling fine. In the emergency department, the patient was afebrile hemodynamically stable with oxygen saturation 90-100% on room air.  BMP showed a sodium 126, potassium 4.1, bicarbonate 14, serum creatinine 6.17.  WBC 8.3, hemoglobin 10.0, platelets 159,000.  Discharge Diagnoses:   Acute metabolic encephalopathy -Multifactorial including uremia, dehydration, and possible infectious process -Ammonia 45 -Check  [id number] -Folic [id number] -CT brain neg -Follow-up MRI brain---no acute findings -TSH--2.298 -7/3 and 7/4--mental status improved   Intractable nausea, vomiting, diarrhea -due to gastroenteritis, possible flare of chronic pancreatitis -initially placed on bowel rest -C. difficile negative -Stool pathogen panel--neg -continue Protonix daily -GI consult if no improvement -CT abd--no bowel obstruction or inflammation;  neg pancreatic stranding -08/17/20--n/v much improved -diet advanced which patient is tolerating -loperamide prn -diarrhea may also be component of pancreatic insufficiency   Hyperammonemia -uncleared clinical significance as mental status is improving   Hyponatremia -Secondary to ESRD and missed HD   ESRD -Nephrology consulted -Had HD 08/16/2020 and 08/17/20 -HD 7/5 -Continue calcitriol and Sensipar   Chronic diastolic CHF -Appears clinically euvolemic -Fluid removal by dialysis -08/15/20 CT chest--no infiltrates nor pleural effusion   Transaminasemia -This appears to be chronic and likely due to hepatic steatosis -Monitor clinically -Repeat LFTs--stable -no abd pain   Seizure disorder -Continue Keppra   Diabetes mellitus type 2, uncontrolled with hyperglycemia -Hemoglobin A1c--9.0 -NovoLog sliding scale   Essential hypertension -Continue carvedilol, hydralazine, nifedipine -d/c losartan due to hyperkalemia   History of chronic pancreatitis-May be contributing to patient's symptoms.    -Lipase stable at 16 -CT abd--pancreatic calcifications  Hyperkalemia -?spurious -pt received HD on 08/20/20 for K prior to d/c -d/c losartan   Discharge Instructions   Allergies as of 08/20/2020       Reactions   Aspirin Palpitations   Listed on [facility] 11/12/18        Medication List     STOP taking these medications    HYDROcodone-acetaminophen 5-325 MG tablet Commonly known as: NORCO/VICODIN   losartan 25 MG tablet Commonly known as: COZAAR    metoCLOPramide 5 MG tablet Commonly known as: REGLAN  oxyCODONE-acetaminophen 5-325 MG tablet Commonly known as: Percocet   polyethylene glycol 17 g packet Commonly known as: MiraLax   rOPINIRole 1 MG tablet Commonly known as: REQUIP       TAKE these medications    acetaminophen 325 MG tablet Commonly known as: TYLENOL Take 650 mg by mouth every 4 (four) hours as needed for moderate pain.   albuterol 108 (90 Base) MCG/ACT inhaler Commonly known as: VENTOLIN HFA Inhale 2 puffs into the lungs every 4 (four) hours as needed for wheezing or shortness of breath.   calcitRIOL 0.25 MCG capsule Commonly known as: ROCALTROL Take 1 capsule (0.25 mcg total) by mouth Every Tuesday,Thursday,and Saturday with dialysis.   carvedilol 6.25 MG tablet Commonly known as: COREG Take 1 tablet (6.25 mg total) by mouth 2 (two) times daily.   cinacalcet 30 MG tablet Commonly known as: SENSIPAR Take 30 mg by mouth every evening.   diclofenac Sodium 1 % Gel Commonly known as: VOLTAREN Apply 1 application topically daily as needed for mild pain.   ferric citrate 1 GM 210 MG(Fe) tablet Commonly known as: AURYXIA Take 2 tablets (420 mg total) by mouth 3 (three) times daily with meals.   folic acid 1 MG tablet Commonly known as: FOLVITE Take 1 mg by mouth every morning.   hydrALAZINE 25 MG tablet Commonly known as: APRESOLINE Take 1 tablet (25 mg total) by mouth every 8 (eight) hours.   levETIRAcetam 1000 MG tablet Commonly known as: KEPPRA Take 1,000 mg by mouth every morning.   loperamide 2 MG capsule Commonly known as: IMODIUM Take 4 mg by mouth daily as needed for diarrhea or loose stools.   NIFEdipine 60 MG 24 hr tablet Commonly known as: PROCARDIA XL/NIFEDICAL XL Take 60 mg by mouth daily. What changed: Another medication with the same name was removed. Continue taking this medication, and follow the directions you see here.   NovoLOG FlexPen 100 UNIT/ML FlexPen Generic  drug: insulin aspart Inject into the skin See admin instructions. PER SLIDING SCALE IF 151-200=2 U; 201-250=4 U; 251-300=6 U; 301-350=8 U; 351-400=10 U. IF LESS THAN 70 OR OVER 350, NOTIFY DOCTOR   pantoprazole 40 MG tablet Commonly known as: Protonix Take 1 tablet (40 mg total) by mouth daily.   promethazine 25 MG tablet Commonly known as: PHENERGAN Take 25-50 mg by mouth every 8 (eight) hours as needed for nausea or vomiting.   sertraline 50 MG tablet Commonly known as: ZOLOFT Take 50 mg by mouth daily.   Vitamin D (Ergocalciferol) 1.25 MG (50000 UNIT) Caps capsule Commonly known as: DRISDOL Take 50,000 Units by mouth every Friday.        Contact information for after-discharge care     Destination     [facility] .   Service: Skilled Nursing Contact information: [place] [address] [phone]                    Allergies  Allergen Reactions   Aspirin Palpitations    Listed on [facility] 11/12/18    Consultations: renal   Procedures/Studies: CT Abdomen Pelvis Wo Contrast  Result Date: 08/15/2020 CLINICAL DATA:  Right-sided chest and abdominal pain with diarrhea, initial encounter EXAM: CT CHEST, ABDOMEN AND PELVIS WITHOUT CONTRAST TECHNIQUE: Multidetector CT imaging of the chest, abdomen and pelvis was performed following the standard protocol without IV contrast. COMPARISON:  Chest x-ray from earlier in the same day, 03/21/2020. FINDINGS: CT CHEST FINDINGS Cardiovascular: Limited due to lack  of IV contrast. Atherosclerotic calcifications are noted. No cardiac enlargement is seen. No significant coronary calcifications are noted. Pulmonary artery shows no significant enlargement. Mediastinum/Nodes: Thoracic inlet is within normal limits. Scattered small hilar and mediastinal lymph nodes are noted likely reactive in nature. No sizable adenopathy is seen. The esophagus as visualized is within normal limits. Lungs/Pleura: Lungs  are well aerated bilaterally. No focal infiltrate or sizable effusion is seen. A few calcified granulomas are noted. Musculoskeletal: Chronic compression fractures of T10 and T12 are seen. No rib abnormality is noted. CT ABDOMEN PELVIS FINDINGS Hepatobiliary: No focal liver abnormality is seen. No biliary ductal dilatation is seen. The gallbladder is not well visualized. Pancreas: Diffuse calcifications are noted consistent with chronic pancreatitis. Spleen: Normal in size without focal abnormality. Adrenals/Urinary Tract: Adrenal glands are within normal limits. Kidneys demonstrate scattered small nonobstructing stones in the lower poles bilaterally. No ureteral dilatation is seen. The bladder is partially distended but obscured by scatter artifact Stomach/Bowel: Mild diverticular change of the colon is noted. The colon is predominately decompressed. No small bowel obstructive changes are seen. Stomach is decompressed. Vascular/Lymphatic: Aortic atherosclerosis. No enlarged abdominal or pelvic lymph nodes. Reproductive: Uterus is diffusely calcified similar to that seen on prior exam. Other: Mild ascites is noted somewhat less than that seen on the prior exam. No definitive hernia is seen. Mild changes of anasarca are again noted. Musculoskeletal: Right hip prosthesis is noted. L2 chronic compression deformity is seen. Degenerative changes of lumbar spine are noted. IMPRESSION: CT of the chest: Changes of prior granulomatous disease. CT of the abdomen and pelvis: Mild ascites stable from the prior exam. Some mild changes of anasarca are noted as well. Diverticulosis without diverticulitis. Chronic changes similar to that noted on the prior exam. Aortic Atherosclerosis (ICD10-I70.0). Electronically Signed   By: Inez Catalina M.D.   On: 08/15/2020 16:18   DG Shoulder Right  Result Date: 07/31/2020 CLINICAL DATA:  Fall EXAM: RIGHT SHOULDER - 2+ VIEW COMPARISON:  None. FINDINGS: Postoperative changes in the proximal  right humerus with plate and screw fixation device. No acute fracture, subluxation or dislocation. Mild degenerative changes in the [facility] joint. Glenohumeral joint is maintained. IMPRESSION: No acute bony abnormality. Electronically Signed   By: Rolm Baptise M.D.   On: 07/31/2020 20:50   CT Head Wo Contrast  Result Date: 08/15/2020 CLINICAL DATA:  65 year old female with altered mental status. EXAM: CT HEAD WITHOUT CONTRAST TECHNIQUE: Contiguous axial images were obtained from the base of the skull through the vertex without intravenous contrast. COMPARISON:  Head CT dated 07/31/2020. FINDINGS: Brain: Mild age-related atrophy and chronic microvascular ischemic changes. There is no acute intracranial hemorrhage. No mass effect or midline shift no extra-axial fluid collection. Vascular: No hyperdense vessel or unexpected calcification. Skull: Normal. Negative for fracture or focal lesion. Sinuses/Orbits: No acute finding. Other: None IMPRESSION: 1. No acute intracranial pathology. 2. Mild age-related atrophy and chronic microvascular ischemic changes. Electronically Signed   By: Anner Crete M.D.   On: 08/15/2020 16:13   CT Head Wo Contrast  Result Date: 07/31/2020 CLINICAL DATA:  [place] tonight with trauma to the head and neck EXAM: CT HEAD WITHOUT CONTRAST CT CERVICAL SPINE WITHOUT CONTRAST TECHNIQUE: Multidetector CT imaging of the head and cervical spine was performed following the standard protocol without intravenous contrast. Multiplanar CT image reconstructions of the cervical spine were also generated. COMPARISON:  07/17/2020 FINDINGS: CT HEAD FINDINGS Brain: Generalized atrophy. Mild chronic small-vessel ischemic changes of the white matter. No cortical or  large vessel territory infarction. No mass lesion, hemorrhage, hydrocephalus or extra-axial collection. Vascular: There is atherosclerotic calcification of the major vessels at the base of the brain. Skull: Negative Sinuses/Orbits: Clear/normal  Other: None CT CERVICAL SPINE FINDINGS Alignment: Straightening of the normal cervical lordosis. Skull base and vertebrae: No fracture or focal bone lesion. Soft tissues and spinal canal: No soft tissue neck lesion seen. Disc levels: Foramen magnum is widely patent. No significant finding at C1-2. C2-3: Bulging of the disc. No facet arthropathy. No compressive stenosis. C3-4: Chronic central disc herniation which is partially calcified. This effaces the ventral subarachnoid space but does not visibly compress the cord. No apparent foraminal encroachment. C4-5: Chronic central disc herniation which is partially calcified. Effacement of the ventral subarachnoid space but no compression of the cord. Mild bony foraminal narrowing on the right. C5-6: Annular calcification and bulging slightly more prominent towards the left. No compressive central canal stenosis. Proximal foraminal narrowing on the left. C6-7: Normal interspace. C7-T1: Normal interspace. Upper chest: Negative Other: None IMPRESSION: Head CT: No acute or traumatic finding. Mild atrophy and chronic small-vessel change white matter. Cervical spine CT: No acute or traumatic finding. Chronic disc herniations at C3-4 and C4-5 that effaces the subarachnoid space but do not visibly compress the cord. Mild right foraminal narrowing at C4-5. Degenerative spondylosis at C5-6 with proximal foraminal narrowing on the left. Electronically Signed   By: Nelson Chimes M.D.   On: 07/31/2020 20:43   CT Chest Wo Contrast  Result Date: 08/15/2020 CLINICAL DATA:  Right-sided chest and abdominal pain with diarrhea, initial encounter EXAM: CT CHEST, ABDOMEN AND PELVIS WITHOUT CONTRAST TECHNIQUE: Multidetector CT imaging of the chest, abdomen and pelvis was performed following the standard protocol without IV contrast. COMPARISON:  Chest x-ray from earlier in the same day, 03/21/2020. FINDINGS: CT CHEST FINDINGS Cardiovascular: Limited due to lack of IV contrast.  Atherosclerotic calcifications are noted. No cardiac enlargement is seen. No significant coronary calcifications are noted. Pulmonary artery shows no significant enlargement. Mediastinum/Nodes: Thoracic inlet is within normal limits. Scattered small hilar and mediastinal lymph nodes are noted likely reactive in nature. No sizable adenopathy is seen. The esophagus as visualized is within normal limits. Lungs/Pleura: Lungs are well aerated bilaterally. No focal infiltrate or sizable effusion is seen. A few calcified granulomas are noted. Musculoskeletal: Chronic compression fractures of T10 and T12 are seen. No rib abnormality is noted. CT ABDOMEN PELVIS FINDINGS Hepatobiliary: No focal liver abnormality is seen. No biliary ductal dilatation is seen. The gallbladder is not well visualized. Pancreas: Diffuse calcifications are noted consistent with chronic pancreatitis. Spleen: Normal in size without focal abnormality. Adrenals/Urinary Tract: Adrenal glands are within normal limits. Kidneys demonstrate scattered small nonobstructing stones in the lower poles bilaterally. No ureteral dilatation is seen. The bladder is partially distended but obscured by scatter artifact Stomach/Bowel: Mild diverticular change of the colon is noted. The colon is predominately decompressed. No small bowel obstructive changes are seen. Stomach is decompressed. Vascular/Lymphatic: Aortic atherosclerosis. No enlarged abdominal or pelvic lymph nodes. Reproductive: Uterus is diffusely calcified similar to that seen on prior exam. Other: Mild ascites is noted somewhat less than that seen on the prior exam. No definitive hernia is seen. Mild changes of anasarca are again noted. Musculoskeletal: Right hip prosthesis is noted. L2 chronic compression deformity is seen. Degenerative changes of lumbar spine are noted. IMPRESSION: CT of the chest: Changes of prior granulomatous disease. CT of the abdomen and pelvis: Mild ascites stable from the prior  exam.  Some mild changes of anasarca are noted as well. Diverticulosis without diverticulitis. Chronic changes similar to that noted on the prior exam. Aortic Atherosclerosis (ICD10-I70.0). Electronically Signed   By: Inez Catalina M.D.   On: 08/15/2020 16:18   CT Cervical Spine Wo Contrast  Result Date: 07/31/2020 CLINICAL DATA:  [place] tonight with trauma to the head and neck EXAM: CT HEAD WITHOUT CONTRAST CT CERVICAL SPINE WITHOUT CONTRAST TECHNIQUE: Multidetector CT imaging of the head and cervical spine was performed following the standard protocol without intravenous contrast. Multiplanar CT image reconstructions of the cervical spine were also generated. COMPARISON:  07/17/2020 FINDINGS: CT HEAD FINDINGS Brain: Generalized atrophy. Mild chronic small-vessel ischemic changes of the white matter. No cortical or large vessel territory infarction. No mass lesion, hemorrhage, hydrocephalus or extra-axial collection. Vascular: There is atherosclerotic calcification of the major vessels at the base of the brain. Skull: Negative Sinuses/Orbits: Clear/normal Other: None CT CERVICAL SPINE FINDINGS Alignment: Straightening of the normal cervical lordosis. Skull base and vertebrae: No fracture or focal bone lesion. Soft tissues and spinal canal: No soft tissue neck lesion seen. Disc levels: Foramen magnum is widely patent. No significant finding at C1-2. C2-3: Bulging of the disc. No facet arthropathy. No compressive stenosis. C3-4: Chronic central disc herniation which is partially calcified. This effaces the ventral subarachnoid space but does not visibly compress the cord. No apparent foraminal encroachment. C4-5: Chronic central disc herniation which is partially calcified. Effacement of the ventral subarachnoid space but no compression of the cord. Mild bony foraminal narrowing on the right. C5-6: Annular calcification and bulging slightly more prominent towards the left. No compressive central canal stenosis.  Proximal foraminal narrowing on the left. C6-7: Normal interspace. C7-T1: Normal interspace. Upper chest: Negative Other: None IMPRESSION: Head CT: No acute or traumatic finding. Mild atrophy and chronic small-vessel change white matter. Cervical spine CT: No acute or traumatic finding. Chronic disc herniations at C3-4 and C4-5 that effaces the subarachnoid space but do not visibly compress the cord. Mild right foraminal narrowing at C4-5. Degenerative spondylosis at C5-6 with proximal foraminal narrowing on the left. Electronically Signed   By: Nelson Chimes M.D.   On: 07/31/2020 20:43   MR BRAIN WO CONTRAST  Result Date: 08/16/2020 CLINICAL DATA:  Change to peripheral vision of left eye. EXAM: MRI HEAD WITHOUT CONTRAST TECHNIQUE: Multiplanar, multiecho pulse sequences of the brain and surrounding structures were obtained without intravenous contrast. COMPARISON:  Head CT August 15, 2020. FINDINGS: Brain: No acute infarction, hemorrhage, hydrocephalus, extra-axial collection or mass lesion. Scattered foci of T2 hyperintensity are seen within the white matter the cerebral hemispheres and within the pons, nonspecific, most likely related to chronic small vessel ischemia. 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 Vascular: Normal flow voids. Skull and upper cervical spine: Normal marrow signal noting hyperostosis frontalis interna. Sinuses/Orbits: Bilateral lens surgery. Paranasal sinuses are clear. Other: Mild bilateral mastoid effusion. IMPRESSION: 1. No acute intracranial abnormality. 2. Mild chronic microvascular ischemic changes of the white matter. 3. 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 Electronically Signed   By: Pedro Earls M.D.   On: 08/16/2020 10:46   DG Chest Portable 1 View  Result Date: 08/15/2020 CLINICAL DATA:  Abdominal pain and diarrhea EXAM: PORTABLE CHEST 1 VIEW COMPARISON:  07/17/2020 FINDINGS: Mild elevation right hemidiaphragm with  mild right lower lobe atelectasis unchanged. Mild left lower lobe atelectasis has developed since the prior study Heart size upper normal. Negative for heart failure. No  significant pleural effusion. IMPRESSION: Mild bibasilar atelectasis. Electronically Signed   By: Franchot Gallo M.D.   On: 08/15/2020 14:38        Discharge Exam: Vitals:   08/20/20 0540 08/20/20 0918  BP: 118/68 (!) 159/70  Pulse: 77 72  Resp: 15   Temp: 98 F (36.7 C)   SpO2: 100% 100%   Vitals:   08/19/20 2031 08/19/20 2208 08/20/20 0540 08/20/20 0918  BP: (!) 96/50 (!) 92/58 118/68 (!) 159/70  Pulse: 72 70 77 72  Resp:   15   Temp: 98.4 F (36.9 C)  98 F (36.7 C)   TempSrc: Oral  Oral   SpO2: 100%  100% 100%  Weight:      Height:        General: Pt is alert, awake, not in acute distress Cardiovascular: RRR, S1/S2 +, no rubs, no gallops Respiratory: CTA bilaterally, no wheezing, no rhonchi Abdominal: Soft, NT, ND, bowel sounds + Extremities: no edema, no cyanosis   The results of significant diagnostics from this hospitalization (including imaging, microbiology, ancillary and laboratory) are listed below for reference.    Significant Diagnostic Studies: CT Abdomen Pelvis Wo Contrast  Result Date: 08/15/2020 CLINICAL DATA:  Right-sided chest and abdominal pain with diarrhea, initial encounter EXAM: CT CHEST, ABDOMEN AND PELVIS WITHOUT CONTRAST TECHNIQUE: Multidetector CT imaging of the chest, abdomen and pelvis was performed following the standard protocol without IV contrast. COMPARISON:  Chest x-ray from earlier in the same day, 03/21/2020. FINDINGS: CT CHEST FINDINGS Cardiovascular: Limited due to lack of IV contrast. Atherosclerotic calcifications are noted. No cardiac enlargement is seen. No significant coronary calcifications are noted. Pulmonary artery shows no significant enlargement. Mediastinum/Nodes: Thoracic inlet is within normal limits. Scattered small hilar and mediastinal lymph nodes are  noted likely reactive in nature. No sizable adenopathy is seen. The esophagus as visualized is within normal limits. Lungs/Pleura: Lungs are well aerated bilaterally. No focal infiltrate or sizable effusion is seen. A few calcified granulomas are noted. Musculoskeletal: Chronic compression fractures of T10 and T12 are seen. No rib abnormality is noted. CT ABDOMEN PELVIS FINDINGS Hepatobiliary: No focal liver abnormality is seen. No biliary ductal dilatation is seen. The gallbladder is not well visualized. Pancreas: Diffuse calcifications are noted consistent with chronic pancreatitis. Spleen: Normal in size without focal abnormality. Adrenals/Urinary Tract: Adrenal glands are within normal limits. Kidneys demonstrate scattered small nonobstructing stones in the lower poles bilaterally. No ureteral dilatation is seen. The bladder is partially distended but obscured by scatter artifact Stomach/Bowel: Mild diverticular change of the colon is noted. The colon is predominately decompressed. No small bowel obstructive changes are seen. Stomach is decompressed. Vascular/Lymphatic: Aortic atherosclerosis. No enlarged abdominal or pelvic lymph nodes. Reproductive: Uterus is diffusely calcified similar to that seen on prior exam. Other: Mild ascites is noted somewhat less than that seen on the prior exam. No definitive hernia is seen. Mild changes of anasarca are again noted. Musculoskeletal: Right hip prosthesis is noted. L2 chronic compression deformity is seen. Degenerative changes of lumbar spine are noted. IMPRESSION: CT of the chest: Changes of prior granulomatous disease. CT of the abdomen and pelvis: Mild ascites stable from the prior exam. Some mild changes of anasarca are noted as well. Diverticulosis without diverticulitis. Chronic changes similar to that noted on the prior exam. Aortic Atherosclerosis (ICD10-I70.0). Electronically Signed   By: Inez Catalina M.D.   On: 08/15/2020 16:18   DG Shoulder  Right  Result Date: 07/31/2020 CLINICAL DATA:  Fall EXAM: RIGHT  SHOULDER - 2+ VIEW COMPARISON:  None. FINDINGS: Postoperative changes in the proximal right humerus with plate and screw fixation device. No acute fracture, subluxation or dislocation. Mild degenerative changes in the [facility] joint. Glenohumeral joint is maintained. IMPRESSION: No acute bony abnormality. Electronically Signed   By: Rolm Baptise M.D.   On: 07/31/2020 20:50   CT Head Wo Contrast  Result Date: 08/15/2020 CLINICAL DATA:  65 year old female with altered mental status. EXAM: CT HEAD WITHOUT CONTRAST TECHNIQUE: Contiguous axial images were obtained from the base of the skull through the vertex without intravenous contrast. COMPARISON:  Head CT dated 07/31/2020. FINDINGS: Brain: Mild age-related atrophy and chronic microvascular ischemic changes. There is no acute intracranial hemorrhage. No mass effect or midline shift no extra-axial fluid collection. Vascular: No hyperdense vessel or unexpected calcification. Skull: Normal. Negative for fracture or focal lesion. Sinuses/Orbits: No acute finding. Other: None IMPRESSION: 1. No acute intracranial pathology. 2. Mild age-related atrophy and chronic microvascular ischemic changes. Electronically Signed   By: Anner Crete M.D.   On: 08/15/2020 16:13   CT Head Wo Contrast  Result Date: 07/31/2020 CLINICAL DATA:  [place] tonight with trauma to the head and neck EXAM: CT HEAD WITHOUT CONTRAST CT CERVICAL SPINE WITHOUT CONTRAST TECHNIQUE: Multidetector CT imaging of the head and cervical spine was performed following the standard protocol without intravenous contrast. Multiplanar CT image reconstructions of the cervical spine were also generated. COMPARISON:  07/17/2020 FINDINGS: CT HEAD FINDINGS Brain: Generalized atrophy. Mild chronic small-vessel ischemic changes of the white matter. No cortical or large vessel territory infarction. No mass lesion, hemorrhage, hydrocephalus or extra-axial  collection. Vascular: There is atherosclerotic calcification of the major vessels at the base of the brain. Skull: Negative Sinuses/Orbits: Clear/normal Other: None CT CERVICAL SPINE FINDINGS Alignment: Straightening of the normal cervical lordosis. Skull base and vertebrae: No fracture or focal bone lesion. Soft tissues and spinal canal: No soft tissue neck lesion seen. Disc levels: Foramen magnum is widely patent. No significant finding at C1-2. C2-3: Bulging of the disc. No facet arthropathy. No compressive stenosis. C3-4: Chronic central disc herniation which is partially calcified. This effaces the ventral subarachnoid space but does not visibly compress the cord. No apparent foraminal encroachment. C4-5: Chronic central disc herniation which is partially calcified. Effacement of the ventral subarachnoid space but no compression of the cord. Mild bony foraminal narrowing on the right. C5-6: Annular calcification and bulging slightly more prominent towards the left. No compressive central canal stenosis. Proximal foraminal narrowing on the left. C6-7: Normal interspace. C7-T1: Normal interspace. Upper chest: Negative Other: None IMPRESSION: Head CT: No acute or traumatic finding. Mild atrophy and chronic small-vessel change white matter. Cervical spine CT: No acute or traumatic finding. Chronic disc herniations at C3-4 and C4-5 that effaces the subarachnoid space but do not visibly compress the cord. Mild right foraminal narrowing at C4-5. Degenerative spondylosis at C5-6 with proximal foraminal narrowing on the left. Electronically Signed   By: Nelson Chimes M.D.   On: 07/31/2020 20:43   CT Chest Wo Contrast  Result Date: 08/15/2020 CLINICAL DATA:  Right-sided chest and abdominal pain with diarrhea, initial encounter EXAM: CT CHEST, ABDOMEN AND PELVIS WITHOUT CONTRAST TECHNIQUE: Multidetector CT imaging of the chest, abdomen and pelvis was performed following the standard protocol without IV contrast.  COMPARISON:  Chest x-ray from earlier in the same day, 03/21/2020. FINDINGS: CT CHEST FINDINGS Cardiovascular: Limited due to lack of IV contrast. Atherosclerotic calcifications are noted. No cardiac enlargement is seen. No significant coronary  calcifications are noted. Pulmonary artery shows no significant enlargement. Mediastinum/Nodes: Thoracic inlet is within normal limits. Scattered small hilar and mediastinal lymph nodes are noted likely reactive in nature. No sizable adenopathy is seen. The esophagus as visualized is within normal limits. Lungs/Pleura: Lungs are well aerated bilaterally. No focal infiltrate or sizable effusion is seen. A few calcified granulomas are noted. Musculoskeletal: Chronic compression fractures of T10 and T12 are seen. No rib abnormality is noted. CT ABDOMEN PELVIS FINDINGS Hepatobiliary: No focal liver abnormality is seen. No biliary ductal dilatation is seen. The gallbladder is not well visualized. Pancreas: Diffuse calcifications are noted consistent with chronic pancreatitis. Spleen: Normal in size without focal abnormality. Adrenals/Urinary Tract: Adrenal glands are within normal limits. Kidneys demonstrate scattered small nonobstructing stones in the lower poles bilaterally. No ureteral dilatation is seen. The bladder is partially distended but obscured by scatter artifact Stomach/Bowel: Mild diverticular change of the colon is noted. The colon is predominately decompressed. No small bowel obstructive changes are seen. Stomach is decompressed. Vascular/Lymphatic: Aortic atherosclerosis. No enlarged abdominal or pelvic lymph nodes. Reproductive: Uterus is diffusely calcified similar to that seen on prior exam. Other: Mild ascites is noted somewhat less than that seen on the prior exam. No definitive hernia is seen. Mild changes of anasarca are again noted. Musculoskeletal: Right hip prosthesis is noted. L2 chronic compression deformity is seen. Degenerative changes of lumbar  spine are noted. IMPRESSION: CT of the chest: Changes of prior granulomatous disease. CT of the abdomen and pelvis: Mild ascites stable from the prior exam. Some mild changes of anasarca are noted as well. Diverticulosis without diverticulitis. Chronic changes similar to that noted on the prior exam. Aortic Atherosclerosis (ICD10-I70.0). Electronically Signed   By: Inez Catalina M.D.   On: 08/15/2020 16:18   CT Cervical Spine Wo Contrast  Result Date: 07/31/2020 CLINICAL DATA:  [place] tonight with trauma to the head and neck EXAM: CT HEAD WITHOUT CONTRAST CT CERVICAL SPINE WITHOUT CONTRAST TECHNIQUE: Multidetector CT imaging of the head and cervical spine was performed following the standard protocol without intravenous contrast. Multiplanar CT image reconstructions of the cervical spine were also generated. COMPARISON:  07/17/2020 FINDINGS: CT HEAD FINDINGS Brain: Generalized atrophy. Mild chronic small-vessel ischemic changes of the white matter. No cortical or large vessel territory infarction. No mass lesion, hemorrhage, hydrocephalus or extra-axial collection. Vascular: There is atherosclerotic calcification of the major vessels at the base of the brain. Skull: Negative Sinuses/Orbits: Clear/normal Other: None CT CERVICAL SPINE FINDINGS Alignment: Straightening of the normal cervical lordosis. Skull base and vertebrae: No fracture or focal bone lesion. Soft tissues and spinal canal: No soft tissue neck lesion seen. Disc levels: Foramen magnum is widely patent. No significant finding at C1-2. C2-3: Bulging of the disc. No facet arthropathy. No compressive stenosis. C3-4: Chronic central disc herniation which is partially calcified. This effaces the ventral subarachnoid space but does not visibly compress the cord. No apparent foraminal encroachment. C4-5: Chronic central disc herniation which is partially calcified. Effacement of the ventral subarachnoid space but no compression of the cord. Mild bony foraminal  narrowing on the right. C5-6: Annular calcification and bulging slightly more prominent towards the left. No compressive central canal stenosis. Proximal foraminal narrowing on the left. C6-7: Normal interspace. C7-T1: Normal interspace. Upper chest: Negative Other: None IMPRESSION: Head CT: No acute or traumatic finding. Mild atrophy and chronic small-vessel change white matter. Cervical spine CT: No acute or traumatic finding. Chronic disc herniations at C3-4 and C4-5 that effaces the subarachnoid  space but do not visibly compress the cord. Mild right foraminal narrowing at C4-5. Degenerative spondylosis at C5-6 with proximal foraminal narrowing on the left. Electronically Signed   By: Nelson Chimes M.D.   On: 07/31/2020 20:43   MR BRAIN WO CONTRAST  Result Date: 08/16/2020 CLINICAL DATA:  Change to peripheral vision of left eye. EXAM: MRI HEAD WITHOUT CONTRAST TECHNIQUE: Multiplanar, multiecho pulse sequences of the brain and surrounding structures were obtained without intravenous contrast. COMPARISON:  Head CT August 15, 2020. FINDINGS: Brain: No acute infarction, hemorrhage, hydrocephalus, extra-axial collection or mass lesion. Scattered foci of T2 hyperintensity are seen within the white matter the cerebral hemispheres and within the pons, nonspecific, most likely related to chronic small vessel ischemia. 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 Vascular: Normal flow voids. Skull and upper cervical spine: Normal marrow signal noting hyperostosis frontalis interna. Sinuses/Orbits: Bilateral lens surgery. Paranasal sinuses are clear. Other: Mild bilateral mastoid effusion. IMPRESSION: 1. No acute intracranial abnormality. 2. Mild chronic microvascular ischemic changes of the white matter. 3. 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 Electronically Signed   By: Pedro Earls M.D.   On: 08/16/2020 10:46   DG Chest Portable 1 View  Result Date:  08/15/2020 CLINICAL DATA:  Abdominal pain and diarrhea EXAM: PORTABLE CHEST 1 VIEW COMPARISON:  07/17/2020 FINDINGS: Mild elevation right hemidiaphragm with mild right lower lobe atelectasis unchanged. Mild left lower lobe atelectasis has developed since the prior study Heart size upper normal. Negative for heart failure. No significant pleural effusion. IMPRESSION: Mild bibasilar atelectasis. Electronically Signed   By: Franchot Gallo M.D.   On: 08/15/2020 14:38    Microbiology: Recent Results (from the past 240 hour(s))  Resp Panel by RT-PCR (Flu A&B, Covid) Nasopharyngeal Swab     Status: None   Collection Time: 08/15/20  1:37 PM   Specimen: Nasopharyngeal Swab; Nasopharyngeal(NP) swabs in vial transport medium  Result Value Ref Range Status   SARS Coronavirus 2 by RT PCR NEGATIVE NEGATIVE Final    Comment: (NOTE) SARS-CoV-2 target nucleic acids are NOT DETECTED.  The SARS-CoV-2 RNA is generally detectable in upper respiratory specimens during the acute phase of infection. The lowest concentration of SARS-CoV-2 viral copies this assay can detect is 138 copies/mL. A negative result does not preclude SARS-Cov-2 infection and should not be used as the sole basis for treatment or other patient management decisions. A negative result may occur with  improper specimen collection/handling, submission of specimen other than nasopharyngeal swab, presence of viral mutation(s) within the areas targeted by this assay, and inadequate number of viral copies(<138 copies/mL). A negative result must be combined with clinical observations, patient history, and epidemiological information. The expected result is Negative.  Fact Sheet for Patients:  EntrepreneurPulse.com.au  Fact Sheet for Healthcare Providers:  IncredibleEmployment.be  This test is no t yet approved or cleared by the [place] FDA and  has been authorized for detection and/or diagnosis of  SARS-CoV-2 by FDA under an Emergency Use Authorization (EUA). This EUA will remain  in effect (meaning this test can be used) for the duration of the COVID-19 declaration under Section 564(b)(1) of the Act, 21 U.S.C.section 360bbb-3(b)(1), unless the authorization is terminated  or revoked sooner.       Influenza A by PCR NEGATIVE NEGATIVE Final   Influenza B by PCR NEGATIVE NEGATIVE Final    Comment: (NOTE) The Xpert Xpress SARS-CoV-2/FLU/RSV plus assay is intended as an aid in the diagnosis of  influenza from Nasopharyngeal swab specimens and should not be used as a sole basis for treatment. Nasal washings and aspirates are unacceptable for Xpert Xpress SARS-CoV-2/FLU/RSV testing.  Fact Sheet for Patients: EntrepreneurPulse.com.au  Fact Sheet for Healthcare Providers: IncredibleEmployment.be  This test is not yet approved or cleared by the [place] FDA and has been authorized for detection and/or diagnosis of SARS-CoV-2 by FDA under an Emergency Use Authorization (EUA). This EUA will remain in effect (meaning this test can be used) for the duration of the COVID-19 declaration under Section 564(b)(1) of the Act, 21 U.S.C. section 360bbb-3(b)(1), unless the authorization is terminated or revoked.  Performed at [facility], [address], [place], [place] [address]   Culture, blood (routine x 2)     Status: None (Preliminary result)   Collection Time: 08/15/20  1:56 PM   Specimen: BLOOD RIGHT ARM  Result Value Ref Range Status   Specimen Description   Final    BLOOD RIGHT ARM BOTTLES DRAWN AEROBIC AND ANAEROBIC   Special Requests Blood Culture adequate volume  Final   Culture   Final    NO GROWTH 3 DAYS Performed at [facility], [address], [place], [place] [address]    Report Status PENDING  Incomplete  Culture, blood (routine x 2)     Status: None (Preliminary result)   Collection Time: 08/15/20  1:57 PM   Specimen: BLOOD  RIGHT HAND  Result Value Ref Range Status   Specimen Description   Final    BLOOD RIGHT HAND BOTTLES DRAWN AEROBIC AND ANAEROBIC   Special Requests Blood Culture adequate volume  Final   Culture   Final    NO GROWTH 3 DAYS Performed at [facility], [address], [place], [place] [address]    Report Status PENDING  Incomplete  Gastrointestinal Panel by PCR , Stool     Status: None   Collection Time: 08/15/20  8:40 PM   Specimen: Stool  Result Value Ref Range Status   Campylobacter species NOT DETECTED NOT DETECTED Final   Plesimonas shigelloides NOT DETECTED NOT DETECTED Final   Salmonella species NOT DETECTED NOT DETECTED Final   Yersinia enterocolitica NOT DETECTED NOT DETECTED Final   Vibrio species NOT DETECTED NOT DETECTED Final   Vibrio cholerae NOT DETECTED NOT DETECTED Final   Enteroaggregative E coli (EAEC) NOT DETECTED NOT DETECTED Final   Enteropathogenic E coli (EPEC) NOT DETECTED NOT DETECTED Final   Enterotoxigenic E coli (ETEC) NOT DETECTED NOT DETECTED Final   Shiga like toxin producing E coli (STEC) NOT DETECTED NOT DETECTED Final   Shigella/Enteroinvasive E coli (EIEC) NOT DETECTED NOT DETECTED Final   Cryptosporidium NOT DETECTED NOT DETECTED Final   Cyclospora cayetanensis NOT DETECTED NOT DETECTED Final   Entamoeba histolytica NOT DETECTED NOT DETECTED Final   Giardia lamblia NOT DETECTED NOT DETECTED Final   Adenovirus F40/41 NOT DETECTED NOT DETECTED Final   Astrovirus NOT DETECTED NOT DETECTED Final   Norovirus GI/GII NOT DETECTED NOT DETECTED Final   Rotavirus A NOT DETECTED NOT DETECTED Final   Sapovirus (I, II, IV, and V) NOT DETECTED NOT DETECTED Final    Comment: Performed at [facility], [address] [place], [place] [address]  C Difficile Quick Screen w PCR reflex     Status: None   Collection Time: 08/15/20  8:40 PM   Specimen: Stool  Result Value Ref Range Status   C Diff antigen NEGATIVE NEGATIVE Final   C Diff toxin NEGATIVE  NEGATIVE  Final   C Diff interpretation No C. difficile detected.  Final    Comment: Performed at [facility], [address], [place],  [address]     Labs: Basic Metabolic Panel: Recent Labs  Lab 08/15/20 1355 08/15/20 1357 08/16/20 0621 08/17/20 0700 08/20/20 0902 08/20/20 1035  NA 126*  --  129* 128* 121* 121*  K 4.1  --  4.1 4.1 >7.5* 7.5*  CL 94*  --  95* 92* 94* 95*  CO2 14*  --  19* 25 19* 18*  GLUCOSE 229*  --  97 240* 130* 214*  BUN 70*  --  73* 22 29* 28*  CREATININE 6.17*  --  6.60* 3.14* 4.35* 3.97*  CALCIUM 8.1*  --  8.7* 8.0* 7.7* 7.5*  MG  --  2.2  --   --   --   --   PHOS  --   --   --   --  3.0  --    Liver Function Tests: Recent Labs  Lab 08/15/20 1355 08/17/20 0700 08/20/20 0902  AST 42* 36  --   ALT 65* 45*  --   ALKPHOS 473* 421*  --   BILITOT 0.7 0.8  --   PROT 7.0 6.4*  --   ALBUMIN 3.3* 2.9* 3.2*   Recent Labs  Lab 08/15/20 1355 08/17/20 0700  LIPASE 16 16   Recent Labs  Lab 08/15/20 1342 08/17/20 1145  AMMONIA 45* 75*   CBC: Recent Labs  Lab 08/15/20 1355 08/16/20 0500 08/17/20 0700 08/20/20 0618  WBC 8.3 6.7 4.9 7.9  NEUTROABS 6.5  --   --   --   HGB 10.0* 9.2* 9.5* 9.6*  HCT 30.6* 28.4* 29.2* 30.4*  MCV 108.5* 108.8* 107.4* 109.0*  PLT 159 155 158 199   Cardiac Enzymes: No results for input(s): CKTOTAL, CKMB, CKMBINDEX, TROPONINI in the last 168 hours. BNP: Invalid input(s): POCBNP CBG: Recent Labs  Lab 08/19/20 1603 08/19/20 2031 08/20/20 0912 08/20/20 1112 08/20/20 1141  GLUCAP 257* 358* 128* 411* 289*    Time coordinating discharge:  36 minutes  Signed:  Orson Eva, DO [facility] Pager: [phone] 08/20/2020, 1:32 PM

## 2020-08-20 NOTE — Progress Notes (Signed)
CRITICAL VALUE STICKER  CRITICAL VALUE: Potassium 7.0  RECEIVER (on-site recipient of call): Cline Draheim E  DATE & TIME NOTIFIED: 7/5 @ 1458  MESSENGER (representative from lab):  MD NOTIFIED: TAT  TIME OF NOTIFICATION: [id number]  RESPONSE: Patient going to HD

## 2020-08-20 NOTE — Progress Notes (Signed)
[facility] NEPHROLOGY PROGRESS NOTE  Assessment/ Plan: Pt is a 65 y.o. yo female with history of DM, HTN, diastolic CHF, chronic pancreatitis, ESRD on HD TTS at [facility] presented with diarrhea, abdominal pain seen as a consultation for the management of ESRD.  OUTPATIENT DIALYSIS:  [facility]  on TTS EDW 42.5kg, Time: 3.75 hours, 2K/ 3 Ca, BFR 400, DFR 600,  Dialyzer: [place] Revacir 200, LUE AVG Heparin 1000 unit load, 500 units/hr (stop 60 min before end of HD)  #Gastroenteritis: C differential and GI pathogen negative.  Clinically improved. Continue supportive management.  # ESRD: last  HD 7/2 and tolerated well. Plan for next HD today as per TTS schedule.    # Anemia: Continue ESA.  Monitor hemoglobin.  # Secondary hyperparathyroidism: Currently on calcitriol and Cinacalcet.  Monitor calcium and phosphorus.  # HTN/volume: On losartan.  UF as tolerated by HD.  #Hyponatremia, hypervolemic: Managed with dialysis.  #Disposition: PT recommending SNF.  Ok to discharge from renal perspective.  Discussed with the dialysis nurse and primary team.  Subjective: Seen and examined.  She feels good without any complaint.  Denies nausea vomiting chest pain shortness of breath.  No diarrhea. Objective Vital signs in last 24 hours: Vitals:   08/19/20 1351 08/19/20 2031 08/19/20 2208 08/20/20 0540  BP: (!) 151/71 (!) 96/50 (!) 92/58 118/68  Pulse: 79 72 70 77  Resp:    15  Temp: (!) 97.4 F (36.3 C) 98.4 F (36.9 C)  98 F (36.7 C)  TempSrc: Oral Oral  Oral  SpO2: 100% 100%  100%  Weight:      Height:       Weight change:   Intake/Output Summary (Last 24 hours) at 08/20/2020 0846 Last data filed at 08/19/2020 1816 Gross per 24 hour  Intake 960 ml  Output --  Net 960 ml        Labs: Basic Metabolic Panel: Recent Labs  Lab 08/15/20 1355 08/16/20 0621 08/17/20 0700  NA 126* 129* 128*  K 4.1 4.1 4.1  CL 94* 95* 92*  CO2 14* 19* 25  GLUCOSE 229* 97  240*  BUN 70* 73* 22  CREATININE 6.17* 6.60* 3.14*  CALCIUM 8.1* 8.7* 8.0*    Liver Function Tests: Recent Labs  Lab 08/15/20 1355 08/17/20 0700  AST 42* 36  ALT 65* 45*  ALKPHOS 473* 421*  BILITOT 0.7 0.8  PROT 7.0 6.4*  ALBUMIN 3.3* 2.9*    Recent Labs  Lab 08/15/20 1355 08/17/20 0700  LIPASE 16 16    Recent Labs  Lab 08/15/20 1342 08/17/20 1145  AMMONIA 45* 75*    CBC: Recent Labs  Lab 08/15/20 1355 08/16/20 0500 08/17/20 0700 08/20/20 0618  WBC 8.3 6.7 4.9 7.9  NEUTROABS 6.5  --   --   --   HGB 10.0* 9.2* 9.5* 9.6*  HCT 30.6* 28.4* 29.2* 30.4*  MCV 108.5* 108.8* 107.4* 109.0*  PLT 159 155 158 199    Cardiac Enzymes: No results for input(s): CKTOTAL, CKMB, CKMBINDEX, TROPONINI in the last 168 hours. CBG: Recent Labs  Lab 08/18/20 1600 08/18/20 2128 08/19/20 0728 08/19/20 1106 08/19/20 1603  GLUCAP 191* 197* 169* 337* 257*     Iron Studies: No results for input(s): IRON, TIBC, TRANSFERRIN, FERRITIN in the last 72 hours. Studies/Results: No results found.  Medications: Infusions:   Scheduled Medications:  calcitRIOL  0.25 mcg Oral Q T,Th,Sa-HD   carvedilol  6.25 mg Oral BID   Chlorhexidine Gluconate Cloth  6 each Topical Q0600   Chlorhexidine Gluconate Cloth  6 each Topical Q0600   cinacalcet  30 mg Oral QPM   heparin  5,000 Units Subcutaneous Q8H   hydrALAZINE  25 mg Oral Q8H   insulin aspart  0-5 Units Subcutaneous QHS   insulin aspart  0-6 Units Subcutaneous TID WC   levETIRAcetam  1,000 mg Oral q morning   LORazepam  0.5 mg Oral Once   losartan  25 mg Oral Daily   NIFEdipine  60 mg Oral q morning   pantoprazole  40 mg Oral Daily    have reviewed scheduled and prn medications.  Physical Exam: General: Lying on bed comfortable, not in distress Heart:RRR, s1s2 nl Lungs: Clear b/l, no crackle Abdomen:soft, Non-tender, non-distended Extremities:No edema Dialysis Access: Left AV graft.  Renisha Cockrum Prasad Aivah Putman 08/20/2020,8:46  AM  LOS: 4 days

## 2020-08-21 LAB — BASIC METABOLIC PANEL
Anion gap: 10 (ref 5–15)
BUN: 12 mg/dL (ref 8–23)
CO2: 27 mmol/L (ref 22–32)
Calcium: 7.6 mg/dL — ABNORMAL LOW (ref 8.9–10.3)
Chloride: 94 mmol/L — ABNORMAL LOW (ref 98–111)
Creatinine, Ser: 2.46 mg/dL — ABNORMAL HIGH (ref 0.44–1.00)
GFR, Estimated: 21 mL/min — ABNORMAL LOW (ref >60)
Glucose, Bld: 220 mg/dL — ABNORMAL HIGH (ref 70–99)
Potassium: 4.3 mmol/L (ref 3.5–5.1)
Sodium: 131 mmol/L — ABNORMAL LOW (ref 135–145)

## 2020-08-21 LAB — CULTURE, BLOOD (ROUTINE X 2)
Culture: NO GROWTH
Culture: NO GROWTH
Special Requests: ADEQUATE
Special Requests: ADEQUATE

## 2020-08-21 LAB — SARS CORONAVIRUS 2 BY RT PCR (HOSPITAL ORDER, PERFORMED IN ~~LOC~~ HOSPITAL LAB): SARS Coronavirus 2: NEGATIVE

## 2020-08-21 LAB — GLUCOSE, CAPILLARY
Glucose-Capillary: 227 mg/dL — ABNORMAL HIGH (ref 70–99)
Glucose-Capillary: 113 mg/dL — ABNORMAL HIGH (ref 70–99)

## 2020-08-21 MED ORDER — CHLORHEXIDINE GLUCONATE CLOTH 2 % EX PADS
6.0000 | MEDICATED_PAD | Freq: Every day | CUTANEOUS | Status: DC
  Administered 2020-08-21: 6 via TOPICAL

## 2020-08-21 NOTE — TOC Progression Note (Signed)
Transition of Care [facility]) - Progression Note    Patient Details  Name: AZAYAH GENS MRN: [id number] Date of Birth: [birth year]  Transition of Care [facility]) CM/SW Contact  Boneta Lucks, RN Phone Number: 08/21/2020, 11:56 AM  Clinical Narrative:   No transportation yesterday. Patient will go to [facility] today. Transported by [facility] today. RN called report. Clinicals sent in the Hub, facesheet and medical necessity printed on 300.  Expected Discharge Plan: [place] Barriers to Discharge: Barriers Resolved  Expected Discharge Plan and Services Expected Discharge Plan: [place]    Expected Discharge Date: 08/20/20                Readmission Risk Interventions Readmission Risk Prevention Plan 08/20/2020 06/11/2020 11/19/2019  Transportation Screening Complete Complete Complete  Medication Review [employer] photographer) Complete Complete Complete  PCP or Specialist appointment within 3-5 days of discharge Complete - Complete  HRI or Home Care Consult Complete Complete Complete  SW Recovery Care/Counseling Consult Complete Complete Complete  Palliative Care Screening Not Applicable Not Applicable Complete  Skilled Nursing Facility Complete Complete Complete  Some recent data might be hidden

## 2020-08-21 NOTE — Progress Notes (Signed)
[facility] ROUNDING NOTE   Subjective:   Interval History:  Pt is a 65 y.o. yo female with history of DM, HTN, diastolic CHF, chronic pancreatitis, ESRD on HD TTS at [facility] presented with diarrhea, abdominal pain seen as a consultation for the management of ESRD.  Next dialysis 7/7  -Diagnosed with  Gastroenteritis with negative pathogen work up  BP 146/68  sats 100 %  room air    T 99     last dialysis  UF 1 L  7/5  Coreg, sensipar, hydralazine, insulin,  keppra, cozaar, procardia, protonix,   Na 131  K 4.3  Cl 94  CO2 27  Glc 220  Cr 2.46   Ca 7.6    MRI  negative   microvascular ischemic changes  Objective:  Vital signs in last 24 hours:  Temp:  [97.9 F (36.6 C)-99 F (37.2 C)] 99 F (37.2 C) (07/06 0447) Pulse Rate:  [70-92] 85 (07/06 0612) Resp:  [16-81] 17 (07/06 0447) BP: (120-167)/(54-77) 146/68 (07/06 0819) SpO2:  [100 %] 100 % (07/06 0447)  Weight change:  Filed Weights   08/15/20 1313 08/16/20 1435 08/17/20 1135  Weight: 42.2 kg 42.1 kg 48.8 kg    Intake/Output: I/O last 3 completed shifts: In: 1360 [P.O.:1360] Out: 1054 [Other:1054]   Intake/Output this shift:  No intake/output data recorded.  CVS- RRR  no murmurs RS- CTA no rales ABD- BS present soft non-distended EXT- no edema  Left Graft   Basic Metabolic Panel: Recent Labs  Lab 08/15/20 1357 08/16/20 0621 08/17/20 0700 08/20/20 0902 08/20/20 1035 08/20/20 1420 08/21/20 0813  NA  --  129* 128* 121* 121*  --  131*  K  --  4.1 4.1 >7.5* 7.5* 7.0* 4.3  CL  --  95* 92* 94* 95*  --  94*  CO2  --  19* 25 19* 18*  --  27  GLUCOSE  --  97 240* 130* 214*  --  220*  BUN  --  73* 22 29* 28*  --  12  CREATININE  --  6.60* 3.14* 4.35* 3.97*  --  2.46*  CALCIUM  --  8.7* 8.0* 7.7* 7.5*  --  7.6*  MG 2.2  --   --   --   --   --   --   PHOS  --   --   --  3.0  --   --   --     Liver Function Tests: Recent Labs  Lab 08/15/20 1355 08/17/20 0700 08/20/20 0902  AST 42* 36   --   ALT 65* 45*  --   ALKPHOS 473* 421*  --   BILITOT 0.7 0.8  --   PROT 7.0 6.4*  --   ALBUMIN 3.3* 2.9* 3.2*   Recent Labs  Lab 08/15/20 1355 08/17/20 0700  LIPASE 16 16   Recent Labs  Lab 08/15/20 1342 08/17/20 1145  AMMONIA 45* 75*    CBC: Recent Labs  Lab 08/15/20 1355 08/16/20 0500 08/17/20 0700 08/20/20 0618  WBC 8.3 6.7 4.9 7.9  NEUTROABS 6.5  --   --   --   HGB 10.0* 9.2* 9.5* 9.6*  HCT 30.6* 28.4* 29.2* 30.4*  MCV 108.5* 108.8* 107.4* 109.0*  PLT 159 155 158 199    Cardiac Enzymes: No results for input(s): CKTOTAL, CKMB, CKMBINDEX, TROPONINI in the last 168 hours.  BNP: Invalid input(s): POCBNP  CBG: Recent Labs  Lab 08/20/20 1112 08/20/20 1141 08/20/20 1839 08/20/20  2146 08/21/20 0736  GLUCAP 411* 289* 72 198* 227*    Microbiology: Results for orders placed or performed during the hospital encounter of 08/15/20  Resp Panel by RT-PCR (Flu A&B, Covid) Nasopharyngeal Swab     Status: None   Collection Time: 08/15/20  1:37 PM   Specimen: Nasopharyngeal Swab; Nasopharyngeal(NP) swabs in vial transport medium  Result Value Ref Range Status   SARS Coronavirus 2 by RT PCR NEGATIVE NEGATIVE Final    Comment: (NOTE) SARS-CoV-2 target nucleic acids are NOT DETECTED.  The SARS-CoV-2 RNA is generally detectable in upper respiratory specimens during the acute phase of infection. The lowest concentration of SARS-CoV-2 viral copies this assay can detect is 138 copies/mL. A negative result does not preclude SARS-Cov-2 infection and should not be used as the sole basis for treatment or other patient management decisions. A negative result may occur with  improper specimen collection/handling, submission of specimen other than nasopharyngeal swab, presence of viral mutation(s) within the areas targeted by this assay, and inadequate number of viral copies(<138 copies/mL). A negative result must be combined with clinical observations, patient history,  and epidemiological information. The expected result is Negative.  Fact Sheet for Patients:  EntrepreneurPulse.com.au  Fact Sheet for Healthcare Providers:  IncredibleEmployment.be  This test is no t yet approved or cleared by the [place] FDA and  has been authorized for detection and/or diagnosis of SARS-CoV-2 by FDA under an Emergency Use Authorization (EUA). This EUA will remain  in effect (meaning this test can be used) for the duration of the COVID-19 declaration under Section 564(b)(1) of the Act, 21 U.S.C.section 360bbb-3(b)(1), unless the authorization is terminated  or revoked sooner.       Influenza A by PCR NEGATIVE NEGATIVE Final   Influenza B by PCR NEGATIVE NEGATIVE Final    Comment: (NOTE) The Xpert Xpress SARS-CoV-2/FLU/RSV plus assay is intended as an aid in the diagnosis of influenza from Nasopharyngeal swab specimens and should not be used as a sole basis for treatment. Nasal washings and aspirates are unacceptable for Xpert Xpress SARS-CoV-2/FLU/RSV testing.  Fact Sheet for Patients: EntrepreneurPulse.com.au  Fact Sheet for Healthcare Providers: IncredibleEmployment.be  This test is not yet approved or cleared by the [place] FDA and has been authorized for detection and/or diagnosis of SARS-CoV-2 by FDA under an Emergency Use Authorization (EUA). This EUA will remain in effect (meaning this test can be used) for the duration of the COVID-19 declaration under Section 564(b)(1) of the Act, 21 U.S.C. section 360bbb-3(b)(1), unless the authorization is terminated or revoked.  Performed at [facility], [address] [place], [place] [address]   Culture, blood (routine x 2)     Status: None   Collection Time: 08/15/20  1:56 PM   Specimen: BLOOD RIGHT ARM  Result Value Ref Range Status   Specimen Description   Final    BLOOD RIGHT ARM BOTTLES DRAWN AEROBIC AND ANAEROBIC    Special Requests Blood Culture adequate volume  Final   Culture   Final    NO GROWTH 6 DAYS Performed at [facility], [address] [place], [place] [address]    Report Status 08/21/2020 FINAL  Final  Culture, blood (routine x 2)     Status: None   Collection Time: 08/15/20  1:57 PM   Specimen: BLOOD RIGHT HAND  Result Value Ref Range Status   Specimen Description   Final    BLOOD RIGHT HAND BOTTLES DRAWN AEROBIC AND ANAEROBIC   Special Requests Blood Culture  adequate volume  Final   Culture   Final    NO GROWTH 6 DAYS Performed at [facility], [address] [place], [place] [address]    Report Status 08/21/2020 FINAL  Final  Gastrointestinal Panel by PCR , Stool     Status: None   Collection Time: 08/15/20  8:40 PM   Specimen: Stool  Result Value Ref Range Status   Campylobacter species NOT DETECTED NOT DETECTED Final   Plesimonas shigelloides NOT DETECTED NOT DETECTED Final   Salmonella species NOT DETECTED NOT DETECTED Final   Yersinia enterocolitica NOT DETECTED NOT DETECTED Final   Vibrio species NOT DETECTED NOT DETECTED Final   Vibrio cholerae NOT DETECTED NOT DETECTED Final   Enteroaggregative E coli (EAEC) NOT DETECTED NOT DETECTED Final   Enteropathogenic E coli (EPEC) NOT DETECTED NOT DETECTED Final   Enterotoxigenic E coli (ETEC) NOT DETECTED NOT DETECTED Final   Shiga like toxin producing E coli (STEC) NOT DETECTED NOT DETECTED Final   Shigella/Enteroinvasive E coli (EIEC) NOT DETECTED NOT DETECTED Final   Cryptosporidium NOT DETECTED NOT DETECTED Final   Cyclospora cayetanensis NOT DETECTED NOT DETECTED Final   Entamoeba histolytica NOT DETECTED NOT DETECTED Final   Giardia lamblia NOT DETECTED NOT DETECTED Final   Adenovirus F40/41 NOT DETECTED NOT DETECTED Final   Astrovirus NOT DETECTED NOT DETECTED Final   Norovirus GI/GII NOT DETECTED NOT DETECTED Final   Rotavirus A NOT DETECTED NOT DETECTED Final   Sapovirus (I, II, IV, and V) NOT DETECTED NOT  DETECTED Final    Comment: Performed at [facility], [address] [place], [place] [address]  C Difficile Quick Screen w PCR reflex     Status: None   Collection Time: 08/15/20  8:40 PM   Specimen: Stool  Result Value Ref Range Status   C Diff antigen NEGATIVE NEGATIVE Final   C Diff toxin NEGATIVE NEGATIVE Final   C Diff interpretation No C. difficile detected.  Final    Comment: Performed at [facility], [address], [place], [place] [address]    Coagulation Studies: No results for input(s): LABPROT, INR in the last 72 hours.  Urinalysis: No results for input(s): COLORURINE, LABSPEC, PHURINE, GLUCOSEU, HGBUR, BILIRUBINUR, KETONESUR, PROTEINUR, UROBILINOGEN, NITRITE, LEUKOCYTESUR in the last 72 hours.  Invalid input(s): APPERANCEUR    Imaging: No results found.   Medications:     calcitRIOL  0.25 mcg Oral Q T,Th,Sa-HD   carvedilol  6.25 mg Oral BID   Chlorhexidine Gluconate Cloth  6 each Topical Q0600   Chlorhexidine Gluconate Cloth  6 each Topical Q0600   cinacalcet  30 mg Oral QPM   heparin  5,000 Units Subcutaneous Q8H   hydrALAZINE  25 mg Oral Q8H   insulin aspart  0-5 Units Subcutaneous QHS   insulin aspart  0-6 Units Subcutaneous TID WC   levETIRAcetam  1,000 mg Oral q morning   LORazepam  0.5 mg Oral Once   losartan  25 mg Oral Daily   NIFEdipine  60 mg Oral q morning   pantoprazole  40 mg Oral Daily   acetaminophen **OR** acetaminophen, hydrALAZINE, loperamide, ondansetron **OR** ondansetron (ZOFRAN) IV, polyethylene glycol  Assessment/ Plan:  OUTPATIENT DIALYSIS:  [facility]  on TTS EDW 42.5kg, Time: 3.75 hours, 2K/ 3 Ca, BFR 400, DFR 600,  Dialyzer: [place] Revacir 200, LUE AVG. Heparin 1000 unit load, 500 units/hr (stop 60 min before end of HD)   Gastroenteritis: C differential and GI pathogen negative.  Clinically improved.  Continue supportive management.  2.   ESRD: last  HD 7/2 and tolerated well. Plan for next HD today as per TTS  schedule.    3.   Anemia: Continue ESA.  Monitor hemoglobin.   4.    Secondary hyperparathyroidism: Currently on calcitriol and Cinacalcet.  Monitor calcium and phosphorus.   5.    HTN/volume: On losartan.  UF as tolerated by HD.  6.    Hyponatremia, hypervolemic: Managed with dialysis.  7.    Disposition: PT recommending SNF.  Ok to discharge from renal perspective.     LOS: [place] '@TODAY''@9'$ :45 AM

## 2020-08-21 NOTE — Care Management Important Message (Signed)
Important Message  Patient Details  Name: Erica [name] MRN: [id number] Date of Birth: 29-Aug-1955   Medicare Important Message Given:  Yes     Tommy Medal 08/21/2020, 12:35 PM

## 2020-08-21 NOTE — Progress Notes (Signed)
08/21/2020 9:57 AM  Pt was seen and examined and reviewed with progression team.   Pt is stable to discharge home today.  Please see full dictated DC summary from Dr. Carles Collet on 08/20/20.    Murvin Natal MD How to contact the [facility] Attending or Consulting provider [place] or covering provider during after hours [place], for this patient?  Check the care team in [facility] and look for a) attending/consulting [facility] provider listed and b) the [facility] team listed Log into [email or web address] and use [place]'s universal password to access. If you do not have the password, please contact the hospital operator. Locate the [facility] provider you are looking for under [facility] and page to a number that you can be directly reached. If you still have difficulty reaching the provider, please page the [facility] (Director on Call) for the Hospitalists listed on amion for assistance.

## 2020-09-17 ENCOUNTER — Emergency Department (HOSPITAL_COMMUNITY): Payer: Medicare Other

## 2020-09-17 ENCOUNTER — Inpatient Hospital Stay (HOSPITAL_COMMUNITY)
Admission: EM | Admit: 2020-09-17 | Discharge: 2020-09-24 | DRG: 638 | Disposition: A | Discharge status: Skilled Nursing Facility | Payer: Medicare Other | Source: Skilled Nursing Facility | Attending: Family Medicine | Admitting: Family Medicine

## 2020-09-17 ENCOUNTER — Other Ambulatory Visit: Payer: Self-pay

## 2020-09-17 ENCOUNTER — Encounter (HOSPITAL_COMMUNITY): Payer: Self-pay | Admitting: Internal Medicine

## 2020-09-17 DIAGNOSIS — E538 Deficiency of other specified B group vitamins: Secondary | ICD-10-CM | POA: Diagnosis present

## 2020-09-17 DIAGNOSIS — K3184 Gastroparesis: Secondary | ICD-10-CM | POA: Diagnosis present

## 2020-09-17 DIAGNOSIS — I252 Old myocardial infarction: Secondary | ICD-10-CM

## 2020-09-17 DIAGNOSIS — I2510 Atherosclerotic heart disease of native coronary artery without angina pectoris: Secondary | ICD-10-CM

## 2020-09-17 DIAGNOSIS — R680 Hypothermia, not associated with low environmental temperature: Secondary | ICD-10-CM | POA: Diagnosis not present

## 2020-09-17 DIAGNOSIS — R109 Unspecified abdominal pain: Secondary | ICD-10-CM

## 2020-09-17 DIAGNOSIS — R4182 Altered mental status, unspecified: Secondary | ICD-10-CM | POA: Diagnosis not present

## 2020-09-17 DIAGNOSIS — F17290 Nicotine dependence, other tobacco product, uncomplicated: Secondary | ICD-10-CM | POA: Diagnosis present

## 2020-09-17 DIAGNOSIS — I959 Hypotension, unspecified: Secondary | ICD-10-CM | POA: Diagnosis present

## 2020-09-17 DIAGNOSIS — R112 Nausea with vomiting, unspecified: Secondary | ICD-10-CM | POA: Diagnosis present

## 2020-09-17 DIAGNOSIS — E559 Vitamin D deficiency, unspecified: Secondary | ICD-10-CM | POA: Diagnosis present

## 2020-09-17 DIAGNOSIS — R040 Epistaxis: Secondary | ICD-10-CM | POA: Diagnosis not present

## 2020-09-17 DIAGNOSIS — I132 Hypertensive heart and chronic kidney disease with heart failure and with stage 5 chronic kidney disease, or end stage renal disease: Secondary | ICD-10-CM | POA: Diagnosis present

## 2020-09-17 DIAGNOSIS — E872 Acidosis, unspecified: Secondary | ICD-10-CM | POA: Diagnosis present

## 2020-09-17 DIAGNOSIS — E162 Hypoglycemia, unspecified: Secondary | ICD-10-CM | POA: Diagnosis not present

## 2020-09-17 DIAGNOSIS — E119 Type 2 diabetes mellitus without complications: Secondary | ICD-10-CM

## 2020-09-17 DIAGNOSIS — E875 Hyperkalemia: Secondary | ICD-10-CM | POA: Diagnosis present

## 2020-09-17 DIAGNOSIS — N186 End stage renal disease: Secondary | ICD-10-CM | POA: Diagnosis present

## 2020-09-17 DIAGNOSIS — R64 Cachexia: Secondary | ICD-10-CM | POA: Diagnosis present

## 2020-09-17 DIAGNOSIS — J690 Pneumonitis due to inhalation of food and vomit: Secondary | ICD-10-CM

## 2020-09-17 DIAGNOSIS — I10 Essential (primary) hypertension: Secondary | ICD-10-CM | POA: Diagnosis present

## 2020-09-17 DIAGNOSIS — D631 Anemia in chronic kidney disease: Secondary | ICD-10-CM | POA: Diagnosis present

## 2020-09-17 DIAGNOSIS — E871 Hypo-osmolality and hyponatremia: Secondary | ICD-10-CM | POA: Diagnosis present

## 2020-09-17 DIAGNOSIS — E11649 Type 2 diabetes mellitus with hypoglycemia without coma: Secondary | ICD-10-CM | POA: Diagnosis not present

## 2020-09-17 DIAGNOSIS — Z886 Allergy status to analgesic agent status: Secondary | ICD-10-CM

## 2020-09-17 DIAGNOSIS — K219 Gastro-esophageal reflux disease without esophagitis: Secondary | ICD-10-CM | POA: Diagnosis present

## 2020-09-17 DIAGNOSIS — E785 Hyperlipidemia, unspecified: Secondary | ICD-10-CM | POA: Diagnosis present

## 2020-09-17 DIAGNOSIS — Z794 Long term (current) use of insulin: Secondary | ICD-10-CM

## 2020-09-17 DIAGNOSIS — Z681 Body mass index (BMI) 19 or less, adult: Secondary | ICD-10-CM

## 2020-09-17 DIAGNOSIS — Z20822 Contact with and (suspected) exposure to covid-19: Secondary | ICD-10-CM | POA: Diagnosis present

## 2020-09-17 DIAGNOSIS — Z992 Dependence on renal dialysis: Secondary | ICD-10-CM

## 2020-09-17 DIAGNOSIS — Z720 Tobacco use: Secondary | ICD-10-CM | POA: Diagnosis present

## 2020-09-17 DIAGNOSIS — I5032 Chronic diastolic (congestive) heart failure: Secondary | ICD-10-CM | POA: Diagnosis present

## 2020-09-17 DIAGNOSIS — D62 Acute posthemorrhagic anemia: Secondary | ICD-10-CM | POA: Diagnosis not present

## 2020-09-17 DIAGNOSIS — Z833 Family history of diabetes mellitus: Secondary | ICD-10-CM

## 2020-09-17 DIAGNOSIS — E1122 Type 2 diabetes mellitus with diabetic chronic kidney disease: Secondary | ICD-10-CM | POA: Diagnosis present

## 2020-09-17 DIAGNOSIS — R718 Other abnormality of red blood cells: Secondary | ICD-10-CM | POA: Diagnosis present

## 2020-09-17 DIAGNOSIS — E1143 Type 2 diabetes mellitus with diabetic autonomic (poly)neuropathy: Secondary | ICD-10-CM | POA: Diagnosis present

## 2020-09-17 DIAGNOSIS — Z79899 Other long term (current) drug therapy: Secondary | ICD-10-CM

## 2020-09-17 DIAGNOSIS — N2581 Secondary hyperparathyroidism of renal origin: Secondary | ICD-10-CM | POA: Diagnosis present

## 2020-09-17 DIAGNOSIS — T82898A Other specified complication of vascular prosthetic devices, implants and grafts, initial encounter: Secondary | ICD-10-CM

## 2020-09-17 DIAGNOSIS — R54 Age-related physical debility: Secondary | ICD-10-CM | POA: Diagnosis present

## 2020-09-17 DIAGNOSIS — T383X5A Adverse effect of insulin and oral hypoglycemic [antidiabetic] drugs, initial encounter: Secondary | ICD-10-CM | POA: Diagnosis present

## 2020-09-17 DIAGNOSIS — Z8673 Personal history of transient ischemic attack (TIA), and cerebral infarction without residual deficits: Secondary | ICD-10-CM

## 2020-09-17 HISTORY — DX: Atherosclerotic heart disease of native coronary artery without angina pectoris: I25.10

## 2020-09-17 LAB — RESP PANEL BY RT-PCR (FLU A&B, COVID) ARPGX2
Influenza A by PCR: NEGATIVE
Influenza B by PCR: NEGATIVE
SARS Coronavirus 2 by RT PCR: NEGATIVE

## 2020-09-17 LAB — CBG MONITORING, ED
Glucose-Capillary: 146 mg/dL — ABNORMAL HIGH (ref 70–99)
Glucose-Capillary: 39 mg/dL — CL (ref 70–99)
Glucose-Capillary: 115 mg/dL — ABNORMAL HIGH (ref 70–99)
Glucose-Capillary: 126 mg/dL — ABNORMAL HIGH (ref 70–99)
Glucose-Capillary: 65 mg/dL — ABNORMAL LOW (ref 70–99)
Glucose-Capillary: 114 mg/dL — ABNORMAL HIGH (ref 70–99)
Glucose-Capillary: 138 mg/dL — ABNORMAL HIGH (ref 70–99)

## 2020-09-17 LAB — BLOOD GAS, VENOUS
Acid-base deficit: 17.9 mmol/L — ABNORMAL HIGH (ref 0.0–2.0)
Bicarbonate: 10.3 mmol/L — ABNORMAL LOW (ref 20.0–28.0)
FIO2: 21
O2 Saturation: 73.4 %
Patient temperature: 36.4
pCO2, Ven: 35.8 mmHg — ABNORMAL LOW (ref 44.0–60.0)
pH, Ven: 7.08 — CL (ref 7.250–7.430)
pO2, Ven: 44.6 mmHg (ref 32.0–45.0)

## 2020-09-17 LAB — BASIC METABOLIC PANEL
Anion gap: 24 — ABNORMAL HIGH (ref 5–15)
BUN: 29 mg/dL — ABNORMAL HIGH (ref 8–23)
CO2: 12 mmol/L — ABNORMAL LOW (ref 22–32)
Calcium: 9.5 mg/dL (ref 8.9–10.3)
Chloride: 95 mmol/L — ABNORMAL LOW (ref 98–111)
Creatinine, Ser: 5.09 mg/dL — ABNORMAL HIGH (ref 0.44–1.00)
GFR, Estimated: 9 mL/min — ABNORMAL LOW (ref >60)
Glucose, Bld: 74 mg/dL (ref 70–99)
Potassium: 4.9 mmol/L (ref 3.5–5.1)
Sodium: 131 mmol/L — ABNORMAL LOW (ref 135–145)

## 2020-09-17 LAB — COMPREHENSIVE METABOLIC PANEL
ALT: 21 U/L (ref 0–44)
AST: 39 U/L (ref 15–41)
Albumin: 4.2 g/dL (ref 3.5–5.0)
Alkaline Phosphatase: 189 U/L — ABNORMAL HIGH (ref 38–126)
Anion gap: 29 — ABNORMAL HIGH (ref 5–15)
BUN: 27 mg/dL — ABNORMAL HIGH (ref 8–23)
CO2: 9 mmol/L — ABNORMAL LOW (ref 22–32)
Calcium: 9.9 mg/dL (ref 8.9–10.3)
Chloride: 92 mmol/L — ABNORMAL LOW (ref 98–111)
Creatinine, Ser: 5.02 mg/dL — ABNORMAL HIGH (ref 0.44–1.00)
GFR, Estimated: 9 mL/min — ABNORMAL LOW (ref >60)
Glucose, Bld: 42 mg/dL — CL (ref 70–99)
Potassium: 5.4 mmol/L — ABNORMAL HIGH (ref 3.5–5.1)
Sodium: 130 mmol/L — ABNORMAL LOW (ref 135–145)
Total Bilirubin: 1.2 mg/dL (ref 0.3–1.2)
Total Protein: 8.2 g/dL — ABNORMAL HIGH (ref 6.5–8.1)

## 2020-09-17 LAB — CBC WITH DIFFERENTIAL/PLATELET
Abs Immature Granulocytes: 0.05 10*3/uL (ref 0.00–0.07)
Basophils Absolute: 0 10*3/uL (ref 0.0–0.1)
Basophils Relative: 0 %
Eosinophils Absolute: 0.1 10*3/uL (ref 0.0–0.5)
Eosinophils Relative: 1 %
HCT: 44 % (ref 36.0–46.0)
Hemoglobin: 13.1 g/dL (ref 12.0–15.0)
Immature Granulocytes: 1 %
Lymphocytes Relative: 24 %
Lymphs Abs: 2.2 10*3/uL (ref 0.7–4.0)
MCH: 35.7 pg — ABNORMAL HIGH (ref 26.0–34.0)
MCHC: 29.8 g/dL — ABNORMAL LOW (ref 30.0–36.0)
MCV: 119.9 fL — ABNORMAL HIGH (ref 80.0–100.0)
Monocytes Absolute: 1 10*3/uL (ref 0.1–1.0)
Monocytes Relative: 10 %
Neutro Abs: 5.9 10*3/uL (ref 1.7–7.7)
Neutrophils Relative %: 64 %
Platelets: 266 10*3/uL (ref 150–400)
RBC: 3.67 MIL/uL — ABNORMAL LOW (ref 3.87–5.11)
RDW: 14.6 % (ref 11.5–15.5)
WBC: 9.2 10*3/uL (ref 4.0–10.5)
nRBC: 0 % (ref 0.0–0.2)

## 2020-09-17 LAB — LACTIC ACID, PLASMA: Lactic Acid, Venous: >11 mmol/L (ref 0.5–1.9)

## 2020-09-17 LAB — BETA-HYDROXYBUTYRIC ACID: Beta-Hydroxybutyric Acid: 6.97 mmol/L — ABNORMAL HIGH (ref 0.05–0.27)

## 2020-09-17 MED ORDER — SODIUM CHLORIDE 0.9 % IV SOLN: 250.0000 mL | INTRAVENOUS | Status: DC | PRN

## 2020-09-17 MED ORDER — SODIUM CHLORIDE 0.9% FLUSH: 3.0000 mL | Freq: Two times a day (BID) | INTRAVENOUS | Status: DC

## 2020-09-17 MED ORDER — DEXTROSE 10 % IV SOLN: INTRAVENOUS | Status: DC

## 2020-09-17 MED ORDER — ONDANSETRON HCL 4 MG/2ML IJ SOLN
4.0000 mg | Freq: Once | INTRAMUSCULAR | Status: AC
  Administered 2020-09-17: 4 mg via INTRAVENOUS
  Filled 2020-09-17: qty 2

## 2020-09-17 MED ORDER — DEXTROSE-NACL 5-0.9 % IV SOLN: INTRAVENOUS | Status: AC

## 2020-09-17 MED ORDER — SODIUM CHLORIDE 0.9 % IV BOLUS
500.0000 mL | Freq: Once | INTRAVENOUS | Status: AC
  Administered 2020-09-17: 500 mL via INTRAVENOUS

## 2020-09-17 MED ORDER — CARVEDILOL 3.125 MG PO TABS
6.2500 mg | ORAL_TABLET | Freq: Two times a day (BID) | ORAL | Status: DC
  Administered 2020-09-17 – 2020-09-18 (×2): 6.25 mg via ORAL
  Filled 2020-09-17 (×4): qty 2

## 2020-09-17 MED ORDER — SODIUM BICARBONATE 8.4 % IV SOLN
100.0000 meq | Freq: Once | INTRAVENOUS | Status: AC
  Administered 2020-09-17: 100 meq via INTRAVENOUS

## 2020-09-17 MED ORDER — LEVETIRACETAM 500 MG PO TABS
1000.0000 mg | ORAL_TABLET | Freq: Every morning | ORAL | Status: DC
  Administered 2020-09-18 – 2020-09-23 (×6): 1000 mg via ORAL
  Filled 2020-09-17 (×7): qty 2

## 2020-09-17 MED ORDER — DEXTROSE 50 % IV SOLN: 25.0000 g | INTRAVENOUS | Status: DC | PRN

## 2020-09-17 MED ORDER — GLUCOSE 40 % PO GEL
1.0000 | Freq: Once | ORAL | Status: AC
  Administered 2020-09-17: 31 g via ORAL
  Filled 2020-09-17: qty 1

## 2020-09-17 MED ORDER — DEXTROSE 50 % IV SOLN
50.0000 mL | Freq: Once | INTRAVENOUS | Status: AC
  Administered 2020-09-17: 50 mL via INTRAVENOUS
  Filled 2020-09-17: qty 50

## 2020-09-17 MED ORDER — SODIUM BICARBONATE 8.4 % IV SOLN
INTRAVENOUS | Status: AC
  Filled 2020-09-17: qty 50

## 2020-09-17 MED ORDER — CINACALCET HCL 30 MG PO TABS
30.0000 mg | ORAL_TABLET | Freq: Every evening | ORAL | Status: DC
  Administered 2020-09-20 – 2020-09-23 (×3): 30 mg via ORAL
  Filled 2020-09-17 (×10): qty 1

## 2020-09-17 MED ORDER — FERRIC CITRATE 1 GM 210 MG(FE) PO TABS
420.0000 mg | ORAL_TABLET | Freq: Three times a day (TID) | ORAL | Status: DC
  Administered 2020-09-20 – 2020-09-24 (×9): 420 mg via ORAL
  Filled 2020-09-17 (×20): qty 2

## 2020-09-17 MED ORDER — SODIUM CHLORIDE 0.9% FLUSH: 3.0000 mL | INTRAVENOUS | Status: DC | PRN

## 2020-09-17 MED ORDER — CALCITRIOL 0.25 MCG PO CAPS
0.2500 ug | ORAL_CAPSULE | ORAL | Status: DC
  Administered 2020-09-21: 0.25 ug via ORAL
  Filled 2020-09-17: qty 1

## 2020-09-17 MED ORDER — SERTRALINE HCL 50 MG PO TABS
50.0000 mg | ORAL_TABLET | Freq: Every day | ORAL | Status: DC
  Administered 2020-09-19 – 2020-09-23 (×5): 50 mg via ORAL
  Filled 2020-09-17 (×7): qty 1

## 2020-09-17 MED ORDER — GLUCOSE 40 % PO GEL
1.0000 | ORAL | Status: DC | PRN
Start: 2020-09-17 — End: 2020-09-24

## 2020-09-17 MED ORDER — PANTOPRAZOLE SODIUM 40 MG PO TBEC
40.0000 mg | DELAYED_RELEASE_TABLET | Freq: Every day | ORAL | Status: DC
  Administered 2020-09-19 – 2020-09-23 (×5): 40 mg via ORAL
  Filled 2020-09-17 (×7): qty 1

## 2020-09-17 MED ORDER — PANTOPRAZOLE SODIUM 40 MG PO TBEC: 40.0000 mg | DELAYED_RELEASE_TABLET | Freq: Every day | ORAL | Status: DC

## 2020-09-17 MED ORDER — HYDRALAZINE HCL 25 MG PO TABS
25.0000 mg | ORAL_TABLET | Freq: Three times a day (TID) | ORAL | Status: DC
  Filled 2020-09-17: qty 1

## 2020-09-17 MED ORDER — ALBUTEROL SULFATE HFA 108 (90 BASE) MCG/ACT IN AERS: 2.0000 | INHALATION_SPRAY | RESPIRATORY_TRACT | Status: DC | PRN

## 2020-09-17 MED ORDER — GLUCOSE 40 % PO GEL
1.0000 | Freq: Once | ORAL | Status: DC
  Filled 2020-09-17: qty 1

## 2020-09-17 MED ORDER — ENOXAPARIN SODIUM 40 MG/0.4ML IJ SOSY: 40.0000 mg | PREFILLED_SYRINGE | INTRAMUSCULAR | Status: DC

## 2020-09-17 MED ORDER — DEXTROSE-NACL 5-0.9 % IV SOLN: INTRAVENOUS | Status: DC

## 2020-09-17 MED ORDER — NIFEDIPINE ER OSMOTIC RELEASE 30 MG PO TB24
60.0000 mg | ORAL_TABLET | Freq: Every day | ORAL | Status: DC
  Administered 2020-09-18 – 2020-09-19 (×2): 60 mg via ORAL
  Filled 2020-09-17 (×2): qty 2

## 2020-09-17 MED ORDER — ENOXAPARIN SODIUM 30 MG/0.3ML IJ SOSY
30.0000 mg | PREFILLED_SYRINGE | INTRAMUSCULAR | Status: DC
  Administered 2020-09-17 – 2020-09-23 (×6): 30 mg via SUBCUTANEOUS
  Filled 2020-09-17 (×7): qty 0.3

## 2020-09-17 MED ORDER — FOLIC ACID 1 MG PO TABS
1.0000 mg | ORAL_TABLET | Freq: Every morning | ORAL | Status: DC
  Administered 2020-09-20 – 2020-09-23 (×4): 1 mg via ORAL
  Filled 2020-09-17 (×5): qty 1

## 2020-09-17 MED ORDER — DEXTROSE 50 % IV SOLN
25.0000 g | Freq: Once | INTRAVENOUS | Status: AC
  Administered 2020-09-17: 25 g via INTRAVENOUS
  Filled 2020-09-17: qty 50

## 2020-09-17 NOTE — ED Notes (Signed)
CBG: 115 

## 2020-09-17 NOTE — ED Notes (Signed)
Date and time results received: 09/17/20 .2020  Test: Venous pH Critical Value: 7.080  Name of Provider Notified: Dr. Olevia Bowens  Orders Received? Or Actions Taken?: Acknowledged

## 2020-09-17 NOTE — ED Provider Notes (Signed)
[facility] Provider Note   CSN: [id number] Arrival date & time: 09/17/20  1429     History Chief Complaint  Patient presents with   Altered Mental Status    Erica [name] is a 65 y.o. female.  HPI  Patient presents with hypoglycemia.  She is nauseated and vomiting, so she is stopped eating.  She has been getting all of her medications as prescribed at the nursing home including daily insulin.  States that she has been feeling significantly weaker the last 2 days, her last dialysis was on Saturday she felt too weak to go to dialysis today so she was brought to the ED.  She does not have any pain anywhere, but she does states she feels weak.  She had an episode of emesis while in the room talking with me.  Patient was admitted to the hospital 08/15/2020 for nausea and vomiting that was intractable.  She was discharged after period of observation and work-up after she passed p.o. challenge.  She states she has been feeling nauseated since then.  Past Medical History:  Diagnosis Date   Alcohol-induced pancreatitis    Chronic diarrhea    Depression    Diabetes mellitus    fasting blood sugar 110-120s   Dialysis patient ([place])    Diastolic CHF ([place])    DKA (diabetic ketoacidoses)    Gastroparesis    GERD (gastroesophageal reflux disease)    Heart murmur    History of kidney stones    Hyperlipidemia    Hypertension    Hypokalemia    Muscle spasm    Neuropathic pain    Neuropathy    Hx: of   Pyelonephritis    Seizures ([place])    Vitamin B12 deficiency    Vitamin D deficiency     Patient Active Problem List   Diagnosis Date Noted   Altered mental status    Acute metabolic encephalopathy 123456   Diarrhea    Pressure injury of skin 06/09/2020   Sacral fracture ([place]) 06/08/2020   Thrombocytopenia ([place]) 06/08/2020   Hypertensive urgency 06/08/2020   (HFpEF) heart failure with preserved ejection fraction ([place]) 06/08/2020   Fall 06/08/2020    Elevated MCV 06/08/2020   Palliative care by specialist    Generalized weakness 11/14/2019   HCAP (healthcare-associated pneumonia) 11/14/2019   Hyperkalemia 11/13/2019   Hyponatremia 09/27/2019   Closed fracture of right proximal humerus 04/20/2019   Surgery, elective    Seizure ([place]) 11/12/2018   History of hydronephrosis --stents in place 12/08/2017   Stroke ([place]) 11/29/2017   Seizures ([place])    Hydronephrosis    Recurrent UTI    Diabetes mellitus type 2 in nonobese (HCC)    ESRD (end stage renal disease) (HCC)    Anemia of chronic disease    Chronic diastolic congestive heart failure ([place])    Essential hypertension    PICC (peripherally inserted central catheter) in place    Acute pyelonephritis 11/12/2017   Uncontrolled type 2 diabetes mellitus with hyperglycemia, with long-term current use of insulin ([place]) 11/10/2017   Renal failure 11/08/2017   Seizure disorder ([place]) 11/08/2017   Orthostatic hypotension 07/25/2014   Moderate protein malnutrition ([place]) 09/15/2013   Aspiration pneumonia ([place]) 09/15/2013   Acute respiratory failure requiring reintubation ([place]) 09/11/2013   probable Seizures due to metabolic disorder [id number]   Lactic acidosis 03/19/2013   Abdominal pain 03/19/2013   Rotavirus infection 10/29/2012   Type II or unspecified type diabetes mellitus without mention of complication,  uncontrolled 10/29/2012   Protein-calorie malnutrition, severe ([place]) 10/27/2012   NSTEMI (non-ST elevated myocardial infarction) ([place]) 10/26/2012   Fever, unspecified 10/26/2012   Hypotension [id number]   Metabolic acidosis XX123456   Chronic diarrhea 10/25/2012   Tobacco abuse 10/25/2012   DKA (diabetic ketoacidoses) 09/09/2012   Dehydration 09/09/2012   DKA, type 2 ([place]) 05/20/2012   Abnormal LFTs 05/20/2012   Heart murmur, systolic [id number]   Hypoglycemia XX123456   Metabolic encephalopathy XX123456   Alcohol abuse 07/08/2011   Hypokalemia 07/08/2011   Nausea &  vomiting 07/08/2011   H/O chronic pancreatitis 07/08/2011    Past Surgical History:  Procedure Laterality Date   AV FISTULA PLACEMENT Left 12/15/2017   Procedure: INSERTION OF ARTERIOVENOUS (AV) GORE-TEX GRAFT ARM;  Surgeon: Rosetta Posner, MD;  Location: [facility];  Service: Vascular;  Laterality: Left;   CATARACT EXTRACTION W/PHACO Right 03/14/2015   Procedure: CATARACT EXTRACTION PHACO AND INTRAOCULAR LENS PLACEMENT ([place]);  Surgeon: Baruch Goldmann, MD;  Location: [facility];  Service: Ophthalmology;  Laterality: Right;  CDE:11.13   CATARACT EXTRACTION W/PHACO Left 04/11/2015   Procedure: CATARACT EXTRACTION PHACO AND INTRAOCULAR LENS PLACEMENT LEFT EYE CDE=9.68;  Surgeon: Baruch Goldmann, MD;  Location: [facility];  Service: Ophthalmology;  Laterality: Left;   COLONOSCOPY  02/24/2010   cystoscopy with ureteral stent  Bilateral 10/07/2017   At [facility] CV LINE RIGHT  11/17/2017   IR FLUORO GUIDE CV LINE RIGHT  11/22/2017   IR REMOVAL TUN CV CATH W/O FL  01/26/2018   IR US GUIDE VASC ACCESS RIGHT  11/17/2017   MULTIPLE EXTRACTIONS WITH ALVEOLOPLASTY N/A 06/13/2012   Procedure: MULTIPLE EXTRACION WITH ALVEOLOPLASTY EXTRACT: 18, 19, 20, 21, 22, 24, 25, 27, 28, 29, 30, 31;  Surgeon: Gae Bon, DDS;  Location: [facility];  Service: Oral Surgery;  Laterality: N/A;   ORIF HUMERUS FRACTURE Right 04/20/2019   Procedure: OPEN REDUCTION INTERNAL FIXATION (ORIF) PROXIMAL HUMERUS FRACTURE;  Surgeon: Marchia Bond, MD;  Location: [facility];  Service: Orthopedics;  Laterality: Right;   TUBAL LIGATION     URETERAL STENT PLACEMENT  09/2017     OB History     Gravida      Para      Term      Preterm      AB      Living  2      SAB      IAB      Ectopic      Multiple      Live Births              Family History  Problem Relation Age of Onset   Diabetes Sister    Chronic Renal Failure Neg Hx     Social History   Tobacco Use   Smoking status: Never   Smokeless  tobacco: Current    Types: Snuff  Vaping Use   Vaping Use: Never used  Substance Use Topics   Alcohol use: Not Currently    Alcohol/week: 1.0 standard drink    Types: 1 Cans of beer per week   Drug use: No    Home Medications Prior to Admission medications   Medication Sig Start Date End Date Taking? Authorizing Provider  acetaminophen (TYLENOL) 325 MG tablet Take 650 mg by mouth every 4 (four) hours as needed for moderate pain.    [provider]  albuterol (VENTOLIN HFA) 108 (90 Base) MCG/ACT inhaler Inhale 2 puffs into the  lungs every 4 (four) hours as needed for wheezing or shortness of breath.     [provider]  calcitRIOL (ROCALTROL) 0.25 MCG capsule Take 1 capsule (0.25 mcg total) by mouth Every Tuesday,Thursday,and Saturday with dialysis. 12/18/17   Love, Ivan Anchors, PA-C  carvedilol (COREG) 6.25 MG tablet Take 1 tablet (6.25 mg total) by mouth 2 (two) times daily. 11/15/18   Kinnie Feil, MD  cinacalcet (SENSIPAR) 30 MG tablet Take 30 mg by mouth every evening.    [provider]  diclofenac Sodium (VOLTAREN) 1 % GEL Apply 1 application topically daily as needed for mild pain. 08/16/20   [provider]  ferric citrate (AURYXIA) 1 GM 210 MG(Fe) tablet Take 2 tablets (420 mg total) by mouth 3 (three) times daily with meals. 11/15/18   Kinnie Feil, MD  folic acid (FOLVITE) 1 MG tablet Take 1 mg by mouth every morning.    [provider]  hydrALAZINE (APRESOLINE) 25 MG tablet Take 1 tablet (25 mg total) by mouth every 8 (eight) hours. 11/19/19   Oswald Hillock, MD  levETIRAcetam (KEPPRA) 1000 MG tablet Take 1,000 mg by mouth every morning.    [provider]  loperamide (IMODIUM) 2 MG capsule Take 4 mg by mouth daily as needed for diarrhea or loose stools.    [provider]  NIFEdipine (PROCARDIA XL/NIFEDICAL XL) 60 MG 24 hr tablet Take 60 mg by mouth daily. 06/14/20   [provider]  NOVOLOG FLEXPEN 100  UNIT/ML FlexPen Inject into the skin See admin instructions. PER SLIDING SCALE IF 151-200=2 U; 201-250=4 U; 251-300=6 U; 301-350=8 U; 351-400=10 U. IF LESS THAN 70 OR OVER 350, NOTIFY DOCTOR 07/16/20   [provider]  pantoprazole (PROTONIX) 40 MG tablet Take 1 tablet (40 mg total) by mouth daily. 11/19/19 10/26/20  Oswald Hillock, MD  promethazine (PHENERGAN) 25 MG tablet Take 25-50 mg by mouth every 8 (eight) hours as needed for nausea or vomiting. 07/16/20   [provider]  sertraline (ZOLOFT) 50 MG tablet Take 50 mg by mouth daily. 07/16/20   [provider]  Vitamin D, Ergocalciferol, (DRISDOL) 1.25 MG (50000 UT) CAPS capsule Take 50,000 Units by mouth every Friday.    [provider]    Allergies    Aspirin  Review of Systems   Review of Systems  Constitutional:  Positive for appetite change and fever. Negative for chills.  HENT:  Negative for ear pain and sore throat.   Eyes:  Negative for pain and visual disturbance.  Respiratory:  Negative for cough and shortness of breath.   Cardiovascular:  Negative for chest pain and palpitations.  Gastrointestinal:  Positive for nausea and vomiting. Negative for abdominal pain.  Genitourinary:  Negative for dysuria and hematuria.  Musculoskeletal:  Negative for arthralgias and back pain.  Skin:  Negative for color change and rash.  Neurological:  Positive for weakness. Negative for seizures and syncope.  All other systems reviewed and are negative.  Physical Exam Updated Vital Signs BP (!) 105/54   Pulse 80   Temp (!) 97.5 F (36.4 C) (Oral)   Resp 16   Ht '4\' 10"'$  (1.473 m)   Wt 48 kg   SpO2 97%   BMI 22.12 kg/m   Physical Exam Vitals and nursing note reviewed. Exam conducted with a chaperone present.  Constitutional:      Appearance: Normal appearance.     Comments: Frail-appearing, chronically ill.  HENT:  Head: Normocephalic and atraumatic.  Eyes:     General: No scleral icterus.        Right eye: No discharge.        Left eye: No discharge.     Extraocular Movements: Extraocular movements intact.     Pupils: Pupils are equal, round, and reactive to light.  Cardiovascular:     Rate and Rhythm: Normal rate and regular rhythm.     Pulses: Normal pulses.     Heart sounds: Normal heart sounds. No murmur heard.   No friction rub. No gallop.  Pulmonary:     Effort: Pulmonary effort is normal. No respiratory distress.     Breath sounds: Normal breath sounds.  Abdominal:     General: Abdomen is flat. Bowel sounds are normal. There is no distension.     Palpations: Abdomen is soft.     Tenderness: There is no abdominal tenderness.  Skin:    General: Skin is warm and dry.     Coloration: Skin is not jaundiced.  Neurological:     Mental Status: She is alert. She is disoriented.     Coordination: Coordination normal.     Comments: Patient was confused, very weak.  Patient unwilling or unable to follow commands to check cranial nerves.  Grip strength is equal bilaterally.   ED Results / Procedures / Treatments   Labs (all labs ordered are listed, but only abnormal results are displayed) Labs Reviewed  CBG MONITORING, ED - Abnormal; Notable for the following components:      Result Value   Glucose-Capillary 39 (*)    All other components within normal limits  COMPREHENSIVE METABOLIC PANEL  URINALYSIS, ROUTINE W REFLEX MICROSCOPIC  CBC WITH DIFFERENTIAL/PLATELET  CBG MONITORING, ED  CBG MONITORING, ED    EKG None  Radiology No results found.  Procedures Procedures   Medications Ordered in ED Medications  dextrose 50 % solution 50 mL (has no administration in time range)  ondansetron (ZOFRAN) injection 4 mg (has no administration in time range)  sodium chloride flush (NS) 0.9 % injection 3 mL (has no administration in time range)  sodium chloride flush (NS) 0.9 % injection 3 mL (has no administration in time range)  0.9 %  sodium chloride infusion (has no  administration in time range)    ED Course  I have reviewed the triage vital signs and the nursing notes.  Pertinent labs & imaging results that were available during my care of the patient were reviewed by me and considered in my medical decision making (see chart for details).  Clinical Course as of 09/17/20 1848  Tue Sep 17, 2020  1609 CBC with Differential(!) No leukocytosis, no anemia [HS]  1615 DG Chest Port 1 View No acute changes [HS]  1827 Patient has not had any episodes of emesis since being given the Zofran.  She is passing a p.o. challenge.  Blood pressure is a bit soft, will give fluid bolus.  Patient overall seems less disoriented and tired than she did during initial evaluation. [HS]    Clinical Course User Index [HS] Sherrill Raring, PA-C   MDM Rules/Calculators/A&P                           Patient presents with weakness.  She is hypoglycemic to 39, will initiate dextrose and D50.  Will check labs given patient has dialysis today.  We will continue to monitor CBG and work-up for possible electrolyte  derangement as well as other causes for her nausea and vomiting.  Patient has been having nausea and vomiting at home while still getting her insulin, I suspect this is what caused her significant hypoglycemia today.  The pressures have been soft during stay so far, will give her fluid bolus.  She is weak, missing dialysis as well as having continued nausea and vomiting.    Patient is hypotensive in the room, fluid bolus has not been started yet.  Will reevaluate, she is a bit more alert than she was during initial intake.  Sugar recheck was about 126 in the room.  Patient admitted to Dr. Olevia Bowens.   Final Clinical Impression(s) / ED Diagnoses Final diagnoses:  None    Rx / DC Orders ED Discharge Orders     None        Sherrill Raring, Hershal Coria 09/17/20 1849    Isla Pence, MD 09/18/20 [phone]

## 2020-09-17 NOTE — ED Notes (Signed)
Provider at bedside

## 2020-09-17 NOTE — ED Triage Notes (Signed)
Per EMS, called out for AMS and CBG 141 per EMS CBG 61 and patient refused dialysis today due to not wanting to good. Patient alert and following commands. Patient sleepish in triage with pinpoint pupils.

## 2020-09-17 NOTE — ED Notes (Signed)
Pt given juice and pudding to eat as she did not eat her meal tray. CBG-65

## 2020-09-17 NOTE — ED Notes (Signed)
Date and time results received: 09/17/20 9:10 PM  Test: Lactic Acid Critical Value: > 11.0  Name of Provider Notified: Dr.Ortiz  Orders Received? Or Actions Taken?:

## 2020-09-17 NOTE — H&P (Addendum)
History and Physical    Erica [name] [id number] DOB: 06/25/1955 DOA: 09/17/2020  PCP: [name], NP   Patient coming from: [facility].  I have personally briefly reviewed patient's old medical records in [place]  Chief Complaint: Altered mental status.  HPI: Erica [name] is a 65 y.o. female with medical history significant of alcohol induced pancreatitis, alcohol abuse, history of CAD/NSTEMI, depression, type 2 diabetes, ESRD on dialysis (TTS) diastolic dysfunction, history of DKA, gastroparesis, GERD, heart murmur, nephrolithiasis, hyperlipidemia, hypertension, hypokalemia, muscle spasms, neuropathic pain and neuropathy, history of seizures who is coming to the emergency department via EMS due to altered mental status in the setting of hypoglycemia of 39 mg/dL.  She responded to dextrose.  Her mental status has improved.  She is now oriented to name, place, situation and oriented to time and date, except that she thought today was Wednesday.  She also had 2 episodes of emesis.  She denied headache, chest pain, dyspnea, abdominal or back pain at this time.  No orthopnea or PND.   She has had chills, but denied fever.  No rhinorrhea, sore throat, wheezing or hemoptysis. She occasionally gets lower extremity edema.  Denied diarrhea, melena or hematochezia.  She occasionally gets constipated.  She does not urinate much regularly.  No dysuria, frequency or hematuria.  ED Course: Initial vital signs were temperature 97.5 F, pulse 80, respirations 22, BP 105/54 mmHg and O2 sat 97% on room air.  EMS gave dextrose.  She received a 500 mL NS bolus  Lab work: CBC showed a white count of 9.2, hemoglobin 13.1 g/dL with an MCV of 119.9 fL and platelets 266.  Initial CBG was 39 mg/dL.  CMP showed a sodium of 130 30, potassium 5.4, chloride 92 and CO2 9 mmol/L.  Anion gap was 29.  Glucose was 42, BUN 27 and creatinine 5.02 mg/dL.  Total protein was 8.2 g/dL and alkaline  phosphatase 189 units/L.  The rest of the LFTs were normal.  Venous blood gas showed a pH of 7.00, PCO2 of 35.9 and PO2 of 44.6 mmHg.  Bicarbonate was 10.3 and acid base deficit of 17.9 mmol/L.  Lactic acid is more than 11 and beta hydroxybutyric acid was 6.97 mmol/L.  Repeat BMP showed improved CO2 at 12 mmol/help with an improved anion gap down to 24.  Imaging: Her portable chest radiograph showed cardiomegaly with vascular congestion similar to prior film.  Please see image and full radiology report for further detail.  Review of Systems: As per HPI otherwise all other systems reviewed and are negative.  Past Medical History:  Diagnosis Date   Alcohol-induced pancreatitis    CAD (coronary artery disease) 09/17/2020   Chronic diarrhea    Depression    Diabetes mellitus    fasting blood sugar 110-120s   Dialysis patient ([place])    Diastolic CHF ([place])    DKA (diabetic ketoacidoses)    Gastroparesis    GERD (gastroesophageal reflux disease)    Heart murmur    History of kidney stones    Hyperlipidemia    Hypertension    Hypokalemia    Muscle spasm    Neuropathic pain    Neuropathy    Hx: of   Pyelonephritis    Seizures ([place])    Vitamin B12 deficiency    Vitamin D deficiency     Past Surgical History:  Procedure Laterality Date   AV FISTULA PLACEMENT Left 12/15/2017   Procedure: INSERTION OF ARTERIOVENOUS (AV) GORE-TEX  GRAFT ARM;  Surgeon: Rosetta Posner, MD;  Location: [facility] OR;  Service: Vascular;  Laterality: Left;   CATARACT EXTRACTION W/PHACO Right 03/14/2015   Procedure: CATARACT EXTRACTION PHACO AND INTRAOCULAR LENS PLACEMENT ([place]);  Surgeon: Baruch Goldmann, MD;  Location: [facility] ORS;  Service: Ophthalmology;  Laterality: Right;  CDE:11.13   CATARACT EXTRACTION W/PHACO Left 04/11/2015   Procedure: CATARACT EXTRACTION PHACO AND INTRAOCULAR LENS PLACEMENT LEFT EYE CDE=9.68;  Surgeon: Baruch Goldmann, MD;  Location: [facility] OR[facility];  Service: Ophthalmology;  Laterality: Left;   COLONOSCOPY   02/24/2010   cystoscopy with ureteral stent  Bilateral 10/07/2017   At [place] CV LINE RIGHT  11/17/2017   IR FLUORO GUIDE CV LINE RIGHT  11/22/2017   IR REMOVAL TUN CV CATH W/O FL  01/26/2018   IR US GUIDE VASC ACCESS RIGHT  11/17/2017   MULTIPLE EXTRACTIONS WITH ALVEOLOPLASTY N/A 06/13/2012   Procedure: MULTIPLE EXTRACION WITH ALVEOLOPLASTY EXTRACT: 18, 19, 20, 21, 22, 24, 25, 27, 28, 29, 30, 31;  Surgeon: Gae Bon, DDS;  Location: [place];  Service: Oral Surgery;  Laterality: N/A;   ORIF HUMERUS FRACTURE Right 04/20/2019   Procedure: OPEN REDUCTION INTERNAL FIXATION (ORIF) PROXIMAL HUMERUS FRACTURE;  Surgeon: Marchia Bond, MD;  Location: [place];  Service: Orthopedics;  Laterality: Right;   TUBAL LIGATION     URETERAL STENT PLACEMENT  09/2017    Social History  reports that she has never smoked. Her smokeless tobacco use includes snuff. She reports previous alcohol use of about 1.0 standard drink of alcohol per week. She reports that she does not use drugs.  Allergies  Allergen Reactions   Aspirin Palpitations and Other (See Comments)    Listed on [facility] 11/12/18    Family History  Problem Relation Age of Onset   Diabetes Sister    Chronic Renal Failure Neg Hx    Prior to Admission medications   Medication Sig Start Date End Date Taking? Authorizing Provider  acetaminophen (TYLENOL) 325 MG tablet Take 650 mg by mouth every 4 (four) hours as needed for moderate pain.   Yes [provider]  albuterol (VENTOLIN HFA) 108 (90 Base) MCG/ACT inhaler Inhale 2 puffs into the lungs every 4 (four) hours as needed for wheezing or shortness of breath.    Yes [provider]  calcitRIOL (ROCALTROL) 0.25 MCG capsule Take 1 capsule (0.25 mcg total) by mouth Every Tuesday,Thursday,and Saturday with dialysis. 12/18/17  Yes Love, Ivan Anchors, PA-C  carvedilol (COREG) 6.25 MG tablet Take 1 tablet (6.25 mg total) by mouth 2 (two) times daily. 11/15/18  Yes Buriev,  Arie Sabina, MD  cinacalcet (SENSIPAR) 30 MG tablet Take 30 mg by mouth every evening.   Yes [provider]  ferric citrate (AURYXIA) 1 GM 210 MG(Fe) tablet Take 2 tablets (420 mg total) by mouth 3 (three) times daily with meals. 11/15/18  Yes Buriev, Arie Sabina, MD  folic acid (FOLVITE) 1 MG tablet Take 1 mg by mouth every morning.   Yes [provider]  hydrALAZINE (APRESOLINE) 25 MG tablet Take 1 tablet (25 mg total) by mouth every 8 (eight) hours. 11/19/19  Yes Oswald Hillock, MD  levETIRAcetam (KEPPRA) 1000 MG tablet Take 1,000 mg by mouth every morning.   Yes [provider]  loperamide (IMODIUM) 2 MG capsule Take 4 mg by mouth daily as needed for diarrhea or loose stools.   Yes [provider]  metFORMIN (GLUMETZA) 500 MG (MOD) 24 hr  tablet Take 500 mg by mouth daily with breakfast.   Yes [provider]  NIFEdipine (PROCARDIA XL/NIFEDICAL XL) 60 MG 24 hr tablet Take 60 mg by mouth daily. 06/14/20  Yes [provider]  pantoprazole (PROTONIX) 40 MG tablet Take 1 tablet (40 mg total) by mouth daily. 11/19/19 10/26/20 Yes Oswald Hillock, MD  promethazine (PHENERGAN) 25 MG tablet Take 25 mg by mouth every 8 (eight) hours as needed for nausea or vomiting. 07/16/20  Yes [provider]  sertraline (ZOLOFT) 50 MG tablet Take 50 mg by mouth daily. 07/16/20  Yes [provider]  diclofenac Sodium (VOLTAREN) 1 % GEL Apply 1 application topically daily as needed for mild pain. Patient not taking: No sig reported 08/16/20   [provider]  NOVOLOG FLEXPEN 100 UNIT/ML FlexPen Inject into the skin See admin instructions. PER SLIDING SCALE IF 151-200=2 U; 201-250=4 U; 251-300=6 U; 301-350=8 U; 351-400=10 U. IF LESS THAN 70 OR OVER 350, NOTIFY DOCTOR Patient not taking: No sig reported 07/16/20   [provider]  Vitamin D, Ergocalciferol, (DRISDOL) 1.25 MG (50000 UT) CAPS capsule Take 50,000 Units by mouth every Friday. Patient  not taking: No sig reported    [provider]    Physical Exam: Vitals:   09/17/20 1730 09/17/20 1800 09/17/20 1830 09/17/20 1930  BP: (!) 109/47 (!) 84/38 (!) 96/42 (!) 110/54  Pulse: 84 74  74  Resp: '19 17 13 13  '$ Temp:      TempSrc:      SpO2: 97% 100%  100%  Weight:      Height:        Constitutional: Chronically ill-appearing, looks under nourished, but in NA. Eyes: PERRL, lids and conjunctivae normal ENMT: Mucous membranes are moist.  Lips are dry.  Posterior pharynx clear of any exudate or lesions. Neck: normal, supple, no masses, no thyromegaly Respiratory: clear to auscultation bilaterally, no wheezing, no crackles. Normal respiratory effort. No accessory muscle use.  Cardiovascular: Regular rate and rhythm, no murmurs / rubs / gallops. No extremity edema. 2+ pedal pulses. No carotid bruits.  Left wrist AV graft with good thrill. Abdomen: no tenderness, no masses palpated. No hepatosplenomegaly. Bowel sounds positive.  Musculoskeletal: Moderate generalized weakness.  No clubbing / cyanosis. Good ROM, no contractures. Normal muscle tone.  Skin: no rashes, lesions, ulcers on very limited dermatological examination. Neurologic: CN 2-12 grossly intact. Sensation intact, DTR normal. Strength 5/5 in all 4.  Psychiatric: Normal judgment and insight. Alert and oriented x 3. Normal mood.   Labs on Admission: I have personally reviewed following labs and imaging studies  CBC: Recent Labs  Lab 09/17/20 1505  WBC 9.2  NEUTROABS 5.9  HGB 13.1  HCT 44.0  MCV 119.9*  PLT 123456    Basic Metabolic Panel: Recent Labs  Lab 09/17/20 1505  NA 130*  K 5.4*  CL 92*  CO2 9*  GLUCOSE 42*  BUN 27*  CREATININE 5.02*  CALCIUM 9.9    GFR: Estimated Creatinine Clearance: 7.2 mL/min (A) (by C-G formula based on SCr of 5.02 mg/dL (H)).  Liver Function Tests: Recent Labs  Lab 09/17/20 1505  AST 39  ALT 21  ALKPHOS 189*  BILITOT 1.2  PROT 8.2*  ALBUMIN 4.2    Radiological Exams on Admission: DG Chest Port 1 View  Result Date: 09/17/2020 CLINICAL DATA:  Weakness and altered mental status. Missed dialysis. EXAM: PORTABLE CHEST 1 VIEW COMPARISON:  August 15, 2020. FINDINGS: Trachea is midline. Cardiomediastinal contours  with cardiac enlargement as before. Fullness of central pulmonary vasculature. Hilar structures with stable appearance. No lobar consolidative changes. No sign of pleural effusion. EKG leads projecting over the chest. No acute skeletal process on limited assessment. IMPRESSION: Cardiomegaly with vascular congestion. No change from the prior study. Electronically Signed   By: Zetta Bills M.D.   On: 09/17/2020 15:47    11/17/2017   -------------------------------------------------------------------  LV EF: 60% -   65%   -------------------------------------------------------------------  History:   PMH:  Stroke 434.91.  Risk factors:  Hypertension.  Diabetes mellitus.   -------------------------------------------------------------------  Study Conclusions   - Left ventricle: The cavity size was normal. Moderate focal basal    septal hypertrophy. Mild hypertrophy of the rest of the    ventricle. No LV outflow tract gradient noted. Systolic function    was normal. The estimated ejection fraction was in the range of    60% to 65%. Wall motion was normal; there were no regional wall    motion abnormalities. Doppler parameters are consistent with    abnormal left ventricular relaxation (grade 1 diastolic    dysfunction).  - Aortic valve: There was no stenosis. There was trivial    regurgitation.  - Mitral valve: There was chordal systolic anterior motion but not    valvular SAM. Moderately calcified annulus. There was trivial    regurgitation.  - Right ventricle: The cavity size was normal. Systolic function    was normal.  - Right atrium: Catheter noted in RA.  - Tricuspid valve: Peak RV-RA gradient (S): 47 mm Hg.  - Pulmonary  arteries: PA peak pressure: 50 mm Hg (S).  - Inferior vena cava: The vessel was normal in size. The    respirophasic diameter changes were in the normal range (>= 50%),    consistent with normal central venous pressure.   Impressions:   - Normal LV size with moderate asymmetric basal septal hypertrophy,    mild hypertrophy of the rest of the ventricle. No LVOT gradient.    There was chordal but not mitral valve SAM. Normal RV size and    systolic function. No significant valvular abnormalities.    Differential is LVH due to HTN versus a variant of hypertrophic    cardiomyopathy.   EKG: Independently reviewed.  Vent. rate 79 BPM PR interval 102 ms QRS duration 79 ms QT/QTcB 411/472 ms P-R-T axes -61 -19 57 Sinus or ectopic atrial rhythm Short PR interval Borderline left axis deviation Anteroseptal infarct, age indeterminate  Assessment/Plan Principal Problem:   Altered mental status In the setting of   Hypoglycemia Observation/stepdown. Continue dextrose 10% at 50 mL/h infusion. Encourage oral carbohydrate intake. Glucose gel every hour as needed. Dextrose 50% as needed. Has responded to glucose, but is acidotic. Sensipar 30 mg hourly CBG checks.  Active Problems:     Diabetes mellitus type 2 in nonobese (HCC) Discontinue metformin (ESRD is/lactic acidosis). Hold insulin administration.    Metabolic acidosis Secondary to lactic acidosis, ESRD and ketosis. Discontinue metformin 500 mg daily.. Discussed with nephrology on-call (Dr. Justin Mend). Nephrology suggested aggressive treatment of hypoglycemia. Acidosis is likely from depleted glycogen stores/ketosis. Follow-up BMP and venous blood gas every 4 hours. Nephrology will be seeing her in the morning.    ESRD (end stage renal disease) ([place]) As above. Continue Sensipar 30 mg p.o. daily.   Hyponatremia Dilutional. Patient has been drinking some, but not eating much.    Hyperkalemia In the setting of metabolic  acidosis. Continue acidosis treatment. Continue cardio  monitoring. Follow-up potassium level.     Nausea & vomiting Secondary to acidosis. Correct acidosis. Antiemetics as needed.    Essential hypertension Continue nifedipine 60 mg p.o. daily. Continue hydralazine 25 mg every 8 hours. Continue carvedilol 6.25 mg p.o. twice daily. Monitor blood pressure and heart rate.  Elevated MCV Continue folic acid supplementation. Begin vitamin B12 supplementation.    CAD (coronary artery disease) Not on aspirin or statin. Continue carvedilol 6.25 mg p.o. daily.    Tobacco abuse Uses smokeless tobacco. Staff to provide tobacco cessation information.   DVT prophylaxis: Lovenox SQ. Code Status:   Full code. Family Communication:   Disposition Plan:   Patient is from:  [facility].  Anticipated DC to:  [facility].  Anticipated DC date:  09/18/2020.  Anticipated DC barriers: Clinical status. Consults called:  Discussed with nephrology on call (Dr. Justin Mend). Admission status:  Observation/telemetry.   Severity of Illness: High severity after presenting with altered mental status in the setting of hypoglycemia.  Hypoglycemia still persisting after several hours in the emergency department so the patient will be admitted for close observation, supplemental dextrose and close CBG monitoring.  Reubin Milan MD [facility]  How to contact the [facility] Attending or Consulting provider [place] or covering provider during after hours [place], for this patient?   Check the care team in [facility] and look for a) attending/consulting [facility] provider listed and b) the [facility] team listed Log into [email or web address] and use [place]'s universal password to access. If you do not have the password, please contact the hospital operator. Locate the [facility] provider you are looking for under [facility] and page to a number that you can be directly reached. If you still have difficulty reaching the  provider, please page the [facility] (Director on Call) for the Hospitalists listed on amion for assistance.  09/17/2020, 7:49 PM   This document was prepared using Dragon voice recognition software and may contain some unintended transcription errors.

## 2020-09-17 NOTE — ED Triage Notes (Signed)
Given 30g glucagon and Cbg 67

## 2020-09-18 DIAGNOSIS — Z20822 Contact with and (suspected) exposure to covid-19: Secondary | ICD-10-CM | POA: Diagnosis present

## 2020-09-18 DIAGNOSIS — N186 End stage renal disease: Secondary | ICD-10-CM | POA: Diagnosis present

## 2020-09-18 DIAGNOSIS — E119 Type 2 diabetes mellitus without complications: Secondary | ICD-10-CM

## 2020-09-18 DIAGNOSIS — E162 Hypoglycemia, unspecified: Secondary | ICD-10-CM | POA: Diagnosis not present

## 2020-09-18 DIAGNOSIS — Z7189 Other specified counseling: Secondary | ICD-10-CM | POA: Diagnosis not present

## 2020-09-18 DIAGNOSIS — Z992 Dependence on renal dialysis: Secondary | ICD-10-CM | POA: Diagnosis not present

## 2020-09-18 DIAGNOSIS — K219 Gastro-esophageal reflux disease without esophagitis: Secondary | ICD-10-CM | POA: Diagnosis present

## 2020-09-18 DIAGNOSIS — I959 Hypotension, unspecified: Secondary | ICD-10-CM | POA: Diagnosis present

## 2020-09-18 DIAGNOSIS — E11649 Type 2 diabetes mellitus with hypoglycemia without coma: Secondary | ICD-10-CM | POA: Diagnosis present

## 2020-09-18 DIAGNOSIS — E559 Vitamin D deficiency, unspecified: Secondary | ICD-10-CM | POA: Diagnosis present

## 2020-09-18 DIAGNOSIS — E1143 Type 2 diabetes mellitus with diabetic autonomic (poly)neuropathy: Secondary | ICD-10-CM | POA: Diagnosis present

## 2020-09-18 DIAGNOSIS — R112 Nausea with vomiting, unspecified: Secondary | ICD-10-CM

## 2020-09-18 DIAGNOSIS — I252 Old myocardial infarction: Secondary | ICD-10-CM | POA: Diagnosis not present

## 2020-09-18 DIAGNOSIS — R718 Other abnormality of red blood cells: Secondary | ICD-10-CM

## 2020-09-18 DIAGNOSIS — T68XXXA Hypothermia, initial encounter: Secondary | ICD-10-CM | POA: Diagnosis not present

## 2020-09-18 DIAGNOSIS — R54 Age-related physical debility: Secondary | ICD-10-CM | POA: Diagnosis present

## 2020-09-18 DIAGNOSIS — Z681 Body mass index (BMI) 19 or less, adult: Secondary | ICD-10-CM | POA: Diagnosis not present

## 2020-09-18 DIAGNOSIS — E785 Hyperlipidemia, unspecified: Secondary | ICD-10-CM | POA: Diagnosis present

## 2020-09-18 DIAGNOSIS — E538 Deficiency of other specified B group vitamins: Secondary | ICD-10-CM | POA: Diagnosis present

## 2020-09-18 DIAGNOSIS — I5032 Chronic diastolic (congestive) heart failure: Secondary | ICD-10-CM | POA: Diagnosis present

## 2020-09-18 DIAGNOSIS — E871 Hypo-osmolality and hyponatremia: Secondary | ICD-10-CM | POA: Diagnosis present

## 2020-09-18 DIAGNOSIS — Z515 Encounter for palliative care: Secondary | ICD-10-CM | POA: Diagnosis not present

## 2020-09-18 DIAGNOSIS — E1122 Type 2 diabetes mellitus with diabetic chronic kidney disease: Secondary | ICD-10-CM | POA: Diagnosis present

## 2020-09-18 DIAGNOSIS — I10 Essential (primary) hypertension: Secondary | ICD-10-CM

## 2020-09-18 DIAGNOSIS — R64 Cachexia: Secondary | ICD-10-CM | POA: Diagnosis present

## 2020-09-18 DIAGNOSIS — K3184 Gastroparesis: Secondary | ICD-10-CM | POA: Diagnosis present

## 2020-09-18 DIAGNOSIS — D631 Anemia in chronic kidney disease: Secondary | ICD-10-CM | POA: Diagnosis present

## 2020-09-18 DIAGNOSIS — E872 Acidosis: Secondary | ICD-10-CM | POA: Diagnosis present

## 2020-09-18 DIAGNOSIS — Z833 Family history of diabetes mellitus: Secondary | ICD-10-CM | POA: Diagnosis not present

## 2020-09-18 DIAGNOSIS — R4182 Altered mental status, unspecified: Secondary | ICD-10-CM | POA: Diagnosis present

## 2020-09-18 DIAGNOSIS — I132 Hypertensive heart and chronic kidney disease with heart failure and with stage 5 chronic kidney disease, or end stage renal disease: Secondary | ICD-10-CM | POA: Diagnosis present

## 2020-09-18 DIAGNOSIS — D62 Acute posthemorrhagic anemia: Secondary | ICD-10-CM | POA: Diagnosis not present

## 2020-09-18 DIAGNOSIS — Z720 Tobacco use: Secondary | ICD-10-CM

## 2020-09-18 DIAGNOSIS — E875 Hyperkalemia: Secondary | ICD-10-CM

## 2020-09-18 DIAGNOSIS — R400 Somnolence: Secondary | ICD-10-CM | POA: Diagnosis not present

## 2020-09-18 LAB — BETA-HYDROXYBUTYRIC ACID
Beta-Hydroxybutyric Acid: 6.91 mmol/L — ABNORMAL HIGH (ref 0.05–0.27)
Beta-Hydroxybutyric Acid: 6.33 mmol/L — ABNORMAL HIGH (ref 0.05–0.27)

## 2020-09-18 LAB — CBC
HCT: 33.7 % — ABNORMAL LOW (ref 36.0–46.0)
HCT: 36.4 % (ref 36.0–46.0)
Hemoglobin: 10.3 g/dL — ABNORMAL LOW (ref 12.0–15.0)
Hemoglobin: 11.2 g/dL — ABNORMAL LOW (ref 12.0–15.0)
MCH: 35.5 pg — ABNORMAL HIGH (ref 26.0–34.0)
MCH: 35.2 pg — ABNORMAL HIGH (ref 26.0–34.0)
MCHC: 30.6 g/dL (ref 30.0–36.0)
MCHC: 30.8 g/dL (ref 30.0–36.0)
MCV: 116.2 fL — ABNORMAL HIGH (ref 80.0–100.0)
MCV: 114.5 fL — ABNORMAL HIGH (ref 80.0–100.0)
Platelets: 211 10*3/uL (ref 150–400)
Platelets: 187 10*3/uL (ref 150–400)
RBC: 2.9 MIL/uL — ABNORMAL LOW (ref 3.87–5.11)
RBC: 3.18 MIL/uL — ABNORMAL LOW (ref 3.87–5.11)
RDW: 14.6 % (ref 11.5–15.5)
RDW: 14.6 % (ref 11.5–15.5)
WBC: 7.7 10*3/uL (ref 4.0–10.5)
WBC: 9 10*3/uL (ref 4.0–10.5)
nRBC: 0 % (ref 0.0–0.2)
nRBC: 0 % (ref 0.0–0.2)

## 2020-09-18 LAB — CBG MONITORING, ED
Glucose-Capillary: 108 mg/dL — ABNORMAL HIGH (ref 70–99)
Glucose-Capillary: 123 mg/dL — ABNORMAL HIGH (ref 70–99)
Glucose-Capillary: 128 mg/dL — ABNORMAL HIGH (ref 70–99)
Glucose-Capillary: 128 mg/dL — ABNORMAL HIGH (ref 70–99)
Glucose-Capillary: 105 mg/dL — ABNORMAL HIGH (ref 70–99)
Glucose-Capillary: 145 mg/dL — ABNORMAL HIGH (ref 70–99)
Glucose-Capillary: 139 mg/dL — ABNORMAL HIGH (ref 70–99)
Glucose-Capillary: 114 mg/dL — ABNORMAL HIGH (ref 70–99)

## 2020-09-18 LAB — COMPREHENSIVE METABOLIC PANEL
ALT: 16 U/L (ref 0–44)
AST: 24 U/L (ref 15–41)
Albumin: 3.5 g/dL (ref 3.5–5.0)
Alkaline Phosphatase: 147 U/L — ABNORMAL HIGH (ref 38–126)
Anion gap: 26 — ABNORMAL HIGH (ref 5–15)
BUN: 31 mg/dL — ABNORMAL HIGH (ref 8–23)
CO2: 15 mmol/L — ABNORMAL LOW (ref 22–32)
Calcium: 9.4 mg/dL (ref 8.9–10.3)
Chloride: 93 mmol/L — ABNORMAL LOW (ref 98–111)
Creatinine, Ser: 5.34 mg/dL — ABNORMAL HIGH (ref 0.44–1.00)
GFR, Estimated: 8 mL/min — ABNORMAL LOW (ref >60)
Glucose, Bld: 127 mg/dL — ABNORMAL HIGH (ref 70–99)
Potassium: 4.5 mmol/L (ref 3.5–5.1)
Sodium: 134 mmol/L — ABNORMAL LOW (ref 135–145)
Total Bilirubin: 1.1 mg/dL (ref 0.3–1.2)
Total Protein: 6.5 g/dL (ref 6.5–8.1)

## 2020-09-18 LAB — BASIC METABOLIC PANEL
Anion gap: 25 — ABNORMAL HIGH (ref 5–15)
Anion gap: 25 — ABNORMAL HIGH (ref 5–15)
BUN: 29 mg/dL — ABNORMAL HIGH (ref 8–23)
BUN: 33 mg/dL — ABNORMAL HIGH (ref 8–23)
CO2: 11 mmol/L — ABNORMAL LOW (ref 22–32)
CO2: 14 mmol/L — ABNORMAL LOW (ref 22–32)
Calcium: 8.9 mg/dL (ref 8.9–10.3)
Calcium: 9.3 mg/dL (ref 8.9–10.3)
Chloride: 93 mmol/L — ABNORMAL LOW (ref 98–111)
Chloride: 93 mmol/L — ABNORMAL LOW (ref 98–111)
Creatinine, Ser: 5.32 mg/dL — ABNORMAL HIGH (ref 0.44–1.00)
Creatinine, Ser: 5.91 mg/dL — ABNORMAL HIGH (ref 0.44–1.00)
GFR, Estimated: 7 mL/min — ABNORMAL LOW (ref >60)
GFR, Estimated: 8 mL/min — ABNORMAL LOW (ref >60)
Glucose, Bld: 141 mg/dL — ABNORMAL HIGH (ref 70–99)
Glucose, Bld: 83 mg/dL (ref 70–99)
Potassium: 4.7 mmol/L (ref 3.5–5.1)
Potassium: 5 mmol/L (ref 3.5–5.1)
Sodium: 129 mmol/L — ABNORMAL LOW (ref 135–145)
Sodium: 132 mmol/L — ABNORMAL LOW (ref 135–145)

## 2020-09-18 LAB — RENAL FUNCTION PANEL
Albumin: 3.6 g/dL (ref 3.5–5.0)
Anion gap: 22 — ABNORMAL HIGH (ref 5–15)
BUN: 32 mg/dL — ABNORMAL HIGH (ref 8–23)
CO2: 16 mmol/L — ABNORMAL LOW (ref 22–32)
Calcium: 9.3 mg/dL (ref 8.9–10.3)
Chloride: 91 mmol/L — ABNORMAL LOW (ref 98–111)
Creatinine, Ser: 5.94 mg/dL — ABNORMAL HIGH (ref 0.44–1.00)
GFR, Estimated: 7 mL/min — ABNORMAL LOW (ref >60)
Glucose, Bld: 120 mg/dL — ABNORMAL HIGH (ref 70–99)
Phosphorus: 3.6 mg/dL (ref 2.5–4.6)
Potassium: 4.3 mmol/L (ref 3.5–5.1)
Sodium: 129 mmol/L — ABNORMAL LOW (ref 135–145)

## 2020-09-18 LAB — MRSA NEXT GEN BY PCR, NASAL: MRSA by PCR Next Gen: NOT DETECTED

## 2020-09-18 LAB — LACTIC ACID, PLASMA
Lactic Acid, Venous: >11 mmol/L (ref 0.5–1.9)
Lactic Acid, Venous: >11 mmol/L (ref 0.5–1.9)

## 2020-09-18 MED ORDER — PENTAFLUOROPROP-TETRAFLUOROETH EX AERO
1.0000 "application " | INHALATION_SPRAY | CUTANEOUS | Status: DC | PRN
  Filled 2020-09-18: qty 30

## 2020-09-18 MED ORDER — ALTEPLASE 2 MG IJ SOLR
2.0000 mg | Freq: Once | INTRAMUSCULAR | Status: DC | PRN
  Filled 2020-09-18: qty 2

## 2020-09-18 MED ORDER — LIDOCAINE HCL (PF) 1 % IJ SOLN: 5.0000 mL | INTRAMUSCULAR | Status: DC | PRN

## 2020-09-18 MED ORDER — INSULIN REGULAR(HUMAN) IN NACL 100-0.9 UT/100ML-% IV SOLN
0.5000 [IU]/h | INTRAVENOUS | Status: AC
  Administered 2020-09-19: 0.5 [IU]/h via INTRAVENOUS
  Filled 2020-09-18 (×2): qty 100

## 2020-09-18 MED ORDER — CHLORHEXIDINE GLUCONATE CLOTH 2 % EX PADS
6.0000 | MEDICATED_PAD | Freq: Every day | CUTANEOUS | Status: DC
  Administered 2020-09-19: 6 via TOPICAL

## 2020-09-18 MED ORDER — INSULIN GLARGINE-YFGN 100 UNIT/ML ~~LOC~~ SOLN
3.0000 [IU] | Freq: Once | SUBCUTANEOUS | Status: DC
  Filled 2020-09-18: qty 0.03

## 2020-09-18 MED ORDER — SODIUM CHLORIDE 0.9 % IV SOLN: 100.0000 mL | INTRAVENOUS | Status: DC | PRN

## 2020-09-18 MED ORDER — GLUCOSE 40 % PO GEL
1.0000 | ORAL | Status: DC
  Administered 2020-09-19 (×4): 31 g via ORAL
  Filled 2020-09-18 (×3): qty 1
  Filled 2020-09-18: qty 9
  Filled 2020-09-18: qty 1

## 2020-09-18 MED ORDER — HEPARIN SODIUM (PORCINE) 1000 UNIT/ML DIALYSIS: 1000.0000 [IU] | INTRAMUSCULAR | Status: DC | PRN

## 2020-09-18 MED ORDER — LIDOCAINE-PRILOCAINE 2.5-2.5 % EX CREA: 1.0000 "application " | TOPICAL_CREAM | CUTANEOUS | Status: DC | PRN

## 2020-09-18 NOTE — ED Notes (Signed)
Pt cleaned of bowls. Clean purwick placed

## 2020-09-18 NOTE — Progress Notes (Signed)
PROGRESS NOTE   Erica [name]  [id number] DOB: 11-23-1955 DOA: 09/17/2020 PCP: [name], NP   Chief Complaint  Patient presents with   Altered Mental Status   Level of care: Stepdown  Brief Admission History:  65 y.o. female with medical history significant of alcohol induced pancreatitis, alcohol abuse, history of CAD/NSTEMI, depression, type 2 diabetes, ESRD on dialysis (TTS) diastolic dysfunction, history of DKA, gastroparesis, GERD, heart murmur, nephrolithiasis, hyperlipidemia, hypertension, hypokalemia, muscle spasms, neuropathic pain and neuropathy, history of seizures who is coming to the emergency department via EMS due to altered mental status in the setting of hypoglycemia of 39 mg/dL.  She responded to dextrose.  Her mental status has improved.  She is now oriented to name, place, situation and oriented to time and date, except that she thought today was Wednesday.  She also had 2 episodes of emesis.  She denied headache, chest pain, dyspnea, abdominal or back pain at this time.  No orthopnea or PND.   She has had chills, but denied fever.  No rhinorrhea, sore throat, wheezing or hemoptysis. She occasionally gets lower extremity edema.  Denied diarrhea, melena or hematochezia.  She occasionally gets constipated.  She does not urinate much regularly.  No dysuria, frequency or hematuria.  Assessment & Plan:   Principal Problem:   Hypoglycemia Active Problems:   Nausea & vomiting   Metabolic acidosis   Tobacco abuse   Lactic acidosis   Essential hypertension   Diabetes mellitus type 2 in nonobese (HCC)   ESRD (end stage renal disease) (HCC)   Hyponatremia   Hyperkalemia   Elevated MCV   Altered mental status   CAD (coronary artery disease)   Severe lactic acidosis - severe lactic acid elevation likely secondary to metformin.  Not sure how she was started on this.  She was not discharged on this when she was here last month.  Likely was started at the  nursing facility.  With her chronic kidney disease she has developed a severe lactic acidosis secondary to metformin use.  This has been discontinued.  She is being treated supportively with IV fluid hydration and plan to follow lactic acid levels.  Hypoglycemia-patient has no glycemic reserves due to her severely emaciated state.  She has been started on a dextrose infusion.  Continue to follow.  Type 1 diabetes mellitus-patient has very high ketone levels.  Ketones likely will not come down without insulin administration.  I have ordered for low-dose IV insulin infusion at 0.5 units/h with hourly CBG testing to clear ketones.  Continue dextrose infusion I will increase the infusion to 100 cc/h.  Goal to keep blood glucose greater than 100 and to avoid hypoglycemia while clearing ketones.  End-stage renal disease-appreciate nephrology consultation and they are planning for hemodialysis therapy on 09/19/2020.  Hyperkalemia-secondary to metabolic acidosis this has been treated and resolved we will continue to monitor closely until acidosis has resolved.  Nausea and vomiting-this is resolved.  Essential hypertension-patient having some soft blood pressures with temporarily hold home BP meds until improved.  Tobacco-reported by history unable to counsel patient at this time due to current condition   DVT prophylaxis: Enoxaparin Code Status: Full Family Communication: son updated 09/18/20 telephone Disposition: anticipate return to SNF when medically stabilized Status is: Inpatient  Remains inpatient appropriate because:IV treatments appropriate due to intensity of illness or inability to take PO and Inpatient level of care appropriate due to severity of illness  Dispo: The patient is from: SNF  Anticipated d/c is to: SNF              Patient currently is not medically stable to d/c.   Difficult to place patient No   Consultants:  Nephrology   Procedures:  N/a    Antimicrobials:  N/a    Subjective: Pt able to speak but remains confused, no specific complaints.    Objective: Vitals:   09/18/20 0930 09/18/20 1000 09/18/20 1030 09/18/20 1100  BP: (!) 111/50 (!) 111/50 117/73 (!) 86/60  Pulse:  78 90 90  Resp: '14 19 19 15  '$ Temp:      TempSrc:      SpO2:  99% 99% 98%  Weight:      Height:        Intake/Output Summary (Last 24 hours) at 09/18/2020 1320 Last data filed at 09/17/2020 1947 Gross per 24 hour  Intake 500 ml  Output --  Net 500 ml   Filed Weights   09/17/20 1441  Weight: 48 kg   Examination:  General exam: chronically ill appearing female, lying in bed arousable, oriented to person only, Appears calm and comfortable.  Pt appears emaciated and grossly underweight.  Respiratory system: Clear to auscultation. Respiratory effort normal. Cardiovascular system: normal S1 & S2 heard. No JVD, murmurs, rubs, gallops or clicks. No pedal edema. Gastrointestinal system: Abdomen is nondistended, soft and nontender. No organomegaly or masses felt. Normal bowel sounds heard. Central nervous system: Alert and oriented. No focal neurological deficits. Extremities: Symmetric 5 x 5 power. Skin: No rashes, lesions or ulcers Psychiatry: Judgement and insight appear poor. Mood & affect flat.    Data Reviewed: I have personally reviewed following labs and imaging studies  CBC: Recent Labs  Lab 09/17/20 1505 09/18/20 0411  WBC 9.2 7.7  NEUTROABS 5.9  --   HGB 13.1 10.3*  HCT 44.0 33.7*  MCV 119.9* 116.2*  PLT 266 123456    Basic Metabolic Panel: Recent Labs  Lab 09/17/20 1505 09/17/20 1958 09/17/20 2345 09/18/20 0411  NA 130* 131* 132* 134*  K 5.4* 4.9 4.7 4.5  CL 92* 95* 93* 93*  CO2 9* 12* 14* 15*  GLUCOSE 42* 74 83 127*  BUN 27* 29* 29* 31*  CREATININE 5.02* 5.09* 5.32* 5.34*  CALCIUM 9.9 9.5 9.3 9.4    GFR: Estimated Creatinine Clearance: 6.8 mL/min (A) (by C-G formula based on SCr of 5.34 mg/dL (H)).  Liver Function  Tests: Recent Labs  Lab 09/17/20 1505 09/18/20 0411  AST 39 24  ALT 21 16  ALKPHOS 189* 147*  BILITOT 1.2 1.1  PROT 8.2* 6.5  ALBUMIN 4.2 3.5    CBG: Recent Labs  Lab 09/18/20 0155 09/18/20 0405 09/18/20 0718 09/18/20 1019 09/18/20 1230  GLUCAP 108* 123* 128* 128* 105*    Recent Results (from the past 240 hour(s))  Resp Panel by RT-PCR (Flu A&B, Covid) Nasopharyngeal Swab     Status: None   Collection Time: 09/17/20  6:01 PM   Specimen: Nasopharyngeal Swab; Nasopharyngeal(NP) swabs in vial transport medium  Result Value Ref Range Status   SARS Coronavirus 2 by RT PCR NEGATIVE NEGATIVE Final    Comment: (NOTE) SARS-CoV-2 target nucleic acids are NOT DETECTED.  The SARS-CoV-2 RNA is generally detectable in upper respiratory specimens during the acute phase of infection. The lowest concentration of SARS-CoV-2 viral copies this assay can detect is 138 copies/mL. A negative result does not preclude SARS-Cov-2 infection and should not be used as the sole basis for treatment or  other patient management decisions. A negative result may occur with  improper specimen collection/handling, submission of specimen other than nasopharyngeal swab, presence of viral mutation(s) within the areas targeted by this assay, and inadequate number of viral copies(<138 copies/mL). A negative result must be combined with clinical observations, patient history, and epidemiological information. The expected result is Negative.  Fact Sheet for Patients:  EntrepreneurPulse.com.au  Fact Sheet for Healthcare Providers:  IncredibleEmployment.be  This test is no t yet approved or cleared by the [place] FDA and  has been authorized for detection and/or diagnosis of SARS-CoV-2 by FDA under an Emergency Use Authorization (EUA). This EUA will remain  in effect (meaning this test can be used) for the duration of the COVID-19 declaration under Section 564(b)(1)  of the Act, 21 U.S.C.section 360bbb-3(b)(1), unless the authorization is terminated  or revoked sooner.       Influenza A by PCR NEGATIVE NEGATIVE Final   Influenza B by PCR NEGATIVE NEGATIVE Final    Comment: (NOTE) The Xpert Xpress SARS-CoV-2/FLU/RSV plus assay is intended as an aid in the diagnosis of influenza from Nasopharyngeal swab specimens and should not be used as a sole basis for treatment. Nasal washings and aspirates are unacceptable for Xpert Xpress SARS-CoV-2/FLU/RSV testing.  Fact Sheet for Patients: EntrepreneurPulse.com.au  Fact Sheet for Healthcare Providers: IncredibleEmployment.be  This test is not yet approved or cleared by the [place] FDA and has been authorized for detection and/or diagnosis of SARS-CoV-2 by FDA under an Emergency Use Authorization (EUA). This EUA will remain in effect (meaning this test can be used) for the duration of the COVID-19 declaration under Section 564(b)(1) of the Act, 21 U.S.C. section 360bbb-3(b)(1), unless the authorization is terminated or revoked.  Performed at [facility], [address] [place], [place] [address]      Radiology Studies: [facility] Chest [facility] 1 View  Result Date: 09/17/2020 CLINICAL DATA:  Weakness and altered mental status. Missed dialysis. EXAM: PORTABLE CHEST 1 VIEW COMPARISON:  August 15, 2020. FINDINGS: Trachea is midline. Cardiomediastinal contours with cardiac enlargement as before. Fullness of central pulmonary vasculature. Hilar structures with stable appearance. No lobar consolidative changes. No sign of pleural effusion. EKG leads projecting over the chest. No acute skeletal process on limited assessment. IMPRESSION: Cardiomegaly with vascular congestion. No change from the prior study. Electronically Signed   By: Zetta Bills M.D.   On: 09/17/2020 15:47    Scheduled Meds:  [START ON 09/19/2020] calcitRIOL  0.25 mcg Oral Q T,Th,Sa-HD   carvedilol  6.25 mg Oral  BID   [START ON 09/19/2020] Chlorhexidine Gluconate Cloth  6 each Topical Q0600   cinacalcet  30 mg Oral QPM   enoxaparin (LOVENOX) injection  30 mg Subcutaneous Q24H   ferric citrate  420 mg Oral TID WC   folic acid  1 mg Oral q morning   hydrALAZINE  25 mg Oral Q8H   levETIRAcetam  1,000 mg Oral q morning   NIFEdipine  60 mg Oral Daily   pantoprazole  40 mg Oral Daily   sertraline  50 mg Oral Daily   Continuous Infusions:  dextrose 75 mL/hr at 09/18/20 1232   insulin       LOS: 0 days   Critical Care Procedure Note Authorized and Performed by: Murvin Natal MD  Total Critical Care time:  45 mins  Due to a high probability of clinically significant, life threatening deterioration, the patient required my highest level of preparedness to intervene emergently and I personally spent  this critical care time directly and personally managing the patient.  This critical care time included obtaining a history; examining the patient, pulse oximetry; ordering and review of studies; arranging urgent treatment with development of a management plan; evaluation of patient's response of treatment; frequent reassessment; and discussions with other providers.  This critical care time was performed to assess and manage the high probability of imminent and life threatening deterioration that could result in multi-organ failure.  It was exclusive of separately billable procedures and treating other patients and teaching time.    Irwin Brakeman, MD How to contact the [facility] Attending or Consulting provider [place] or covering provider during after hours [place], for this patient?  Check the care team in [facility] and look for a) attending/consulting TR[facility] provider listed and b) the [facility] team listed Log into [email or web address] and use [place]'s universal password to access. If you do not have the password, please contact the hospital operator. Locate the [facility] provider you are looking for under [facility] and page to a  number that you can be directly reached. If you still have difficulty reaching the provider, please page the [facility] (Director on Call) for the Hospitalists listed on amion for assistance.  09/18/2020, 1:20 PM

## 2020-09-18 NOTE — ED Notes (Signed)
This nurse attempted an IV without success.

## 2020-09-18 NOTE — Progress Notes (Signed)
High inpatient HD census today. Will move patient's HD from today to tomorrow (runs TTS anyway). Volume status and K stable, Bicarb improving today.  Gean Quint, MD [facility]

## 2020-09-18 NOTE — NC FL2 (Signed)
[place] MEDICAID FL2 LEVEL OF CARE SCREENING TOOL     IDENTIFICATION  Patient Name: Erica [name] Birthdate: 1955-05-29 Sex: female Admission Date (Current Location): 09/17/2020  [facility] and [place] Number:  [employer] and Address:  [place] [address], [place]      Provider Number: [id number]  Attending Physician Name and Address:  Murlean Iba, MD  Relative Name and Phone Number:       Current Level of Care: Hospital Recommended Level of Care: [place] Prior Approval Number:    Date Approved/Denied:   PASRR Number:    Discharge Plan: SNF    Current Diagnoses: Patient Active Problem List   Diagnosis Date Noted   CAD (coronary artery disease) 09/17/2020   Altered mental status    Acute metabolic encephalopathy 123456   Diarrhea    Pressure injury of skin 06/09/2020   Sacral fracture ([place]) 06/08/2020   Thrombocytopenia ([place]) 06/08/2020   Hypertensive urgency 06/08/2020   (HFpEF) heart failure with preserved ejection fraction ([place]) 06/08/2020   Fall 06/08/2020   Elevated MCV 06/08/2020   Palliative care by specialist    Generalized weakness 11/14/2019   HCAP (healthcare-associated pneumonia) 11/14/2019   Hyperkalemia 11/13/2019   Hyponatremia 09/27/2019   Closed fracture of right proximal humerus 04/20/2019   Surgery, elective    Seizure ([place]) 11/12/2018   History of hydronephrosis --stents in place 12/08/2017   Stroke ([place]) 11/29/2017   Seizures ([place])    Hydronephrosis    Recurrent UTI    Diabetes mellitus type 2 in nonobese ([place])    ESRD (end stage renal disease) ([place])    Anemia of chronic disease    Chronic diastolic congestive heart failure ([place])    Essential hypertension    PICC (peripherally inserted central catheter) in place    Acute pyelonephritis 11/12/2017   Uncontrolled type 2 diabetes mellitus with hyperglycemia, with long-term current use of insulin ([place]) 11/10/2017    Renal failure 11/08/2017   Seizure disorder ([place]) 11/08/2017   Orthostatic hypotension 07/25/2014   Moderate protein malnutrition ([place]) 09/15/2013   Aspiration pneumonia ([place]) 09/15/2013   Acute respiratory failure requiring reintubation ([place]) 09/11/2013   probable Seizures due to metabolic disorder [id number]   Lactic acidosis 03/19/2013   Abdominal pain 03/19/2013   Rotavirus infection 10/29/2012   Type II or unspecified type diabetes mellitus without mention of complication, uncontrolled 10/29/2012   Protein-calorie malnutrition, severe ([place]) 10/27/2012   NSTEMI (non-ST elevated myocardial infarction) ([place]) 10/26/2012   Fever, unspecified 10/26/2012   Hypotension [id number]   Metabolic acidosis [id number]   Chronic diarrhea 10/25/2012   Tobacco abuse 10/25/2012   DKA (diabetic ketoacidoses) 09/09/2012   Dehydration 09/09/2012   DKA, type 2 ([place]) 05/20/2012   Abnormal LFTs 05/20/2012   Heart murmur, systolic [id number]   Hypoglycemia XX123456   Metabolic encephalopathy XX123456   Alcohol abuse 07/08/2011   Hypokalemia 07/08/2011   Nausea & vomiting 07/08/2011   H/O chronic pancreatitis 07/08/2011    Orientation RESPIRATION BLADDER Height & Weight     Self, Situation, Place, Time  Normal Incontinent Weight: 105 lb 13.1 oz (48 kg) Height:  '4\' 10"'$  (147.3 cm)  BEHAVIORAL SYMPTOMS/MOOD NEUROLOGICAL BOWEL NUTRITION STATUS      Continent Diet (see dc summary)  AMBULATORY STATUS COMMUNICATION OF NEEDS Skin   Extensive Assist Verbally Normal                       Personal  Care Assistance Level of Assistance  Bathing, Feeding, Dressing Bathing Assistance: Maximum assistance Feeding assistance: Independent Dressing Assistance: Maximum assistance     Functional Limitations Info  Sight, Hearing, Speech Sight Info: Adequate Hearing Info: Adequate Speech Info: Adequate    SPECIAL CARE FACTORS FREQUENCY  PT (By licensed PT)     PT Frequency: 3-5 x week               Contractures Contractures Info: Not present    Additional Factors Info  Code Status, Allergies Code Status Info: Full Allergies Info: Aspirin           Current Medications (09/18/2020):  This is the current hospital active medication list Current Facility-Administered Medications  Medication Dose Route Frequency Provider Last Rate Last Admin   albuterol (VENTOLIN HFA) 108 (90 Base) MCG/ACT inhaler 2 puff  2 puff Inhalation Q4H PRN Reubin Milan, MD       [START ON 09/19/2020] calcitRIOL (ROCALTROL) capsule 0.25 mcg  0.25 mcg Oral Q T,Th,Sa-HD Reubin Milan, MD       carvedilol (COREG) tablet 6.25 mg  6.25 mg Oral BID Reubin Milan, MD   6.25 mg at 09/18/20 1021   cinacalcet (SENSIPAR) tablet 30 mg  30 mg Oral QPM Reubin Milan, MD       dextrose (GLUTOSE) 40 % oral gel 31 g  1 Tube Oral Q1H PRN Reubin Milan, MD       dextrose 10 % infusion   Intravenous Continuous Reubin Milan, MD 50 mL/hr at 09/18/20 0403 Infusion Verify at 09/18/20 0403   dextrose 50 % solution 25 g  25 g Intravenous PRN Reubin Milan, MD       enoxaparin (LOVENOX) injection 30 mg  30 mg Subcutaneous Q24H Reubin Milan, MD   30 mg at 09/17/20 2201   ferric citrate (AURYXIA) tablet 420 mg  420 mg Oral TID WC Reubin Milan, MD       folic acid (FOLVITE) tablet 1 mg  1 mg Oral q morning Reubin Milan, MD       hydrALAZINE (APRESOLINE) tablet 25 mg  25 mg Oral Q8H Reubin Milan, MD       levETIRAcetam (KEPPRA) tablet 1,000 mg  1,000 mg Oral q morning Reubin Milan, MD   1,000 mg at 09/18/20 1021   NIFEdipine (PROCARDIA-XL/NIFEDICAL-XL) 24 hr tablet 60 mg  60 mg Oral Daily Reubin Milan, MD   60 mg at 09/18/20 1021   pantoprazole (PROTONIX) EC tablet 40 mg  40 mg Oral Daily Reubin Milan, MD       sertraline (ZOLOFT) tablet 50 mg  50 mg Oral Daily Reubin Milan, MD       Current Outpatient Medications  Medication Sig  Dispense Refill   acetaminophen (TYLENOL) 325 MG tablet Take 650 mg by mouth every 4 (four) hours as needed for moderate pain.     albuterol (VENTOLIN HFA) 108 (90 Base) MCG/ACT inhaler Inhale 2 puffs into the lungs every 4 (four) hours as needed for wheezing or shortness of breath.      calcitRIOL (ROCALTROL) 0.25 MCG capsule Take 1 capsule (0.25 mcg total) by mouth Every Tuesday,Thursday,and Saturday with dialysis.     carvedilol (COREG) 6.25 MG tablet Take 1 tablet (6.25 mg total) by mouth 2 (two) times daily. 60 tablet 0   cinacalcet (SENSIPAR) 30 MG tablet Take 30 mg by mouth every evening.     ferric citrate (  AURYXIA) 1 GM 210 MG(Fe) tablet Take 2 tablets (420 mg total) by mouth 3 (three) times daily with meals. [id number] tablet    folic acid (FOLVITE) 1 MG tablet Take 1 mg by mouth every morning.     hydrALAZINE (APRESOLINE) 25 MG tablet Take 1 tablet (25 mg total) by mouth every 8 (eight) hours.     levETIRAcetam (KEPPRA) 1000 MG tablet Take 1,000 mg by mouth every morning.     loperamide (IMODIUM) 2 MG capsule Take 4 mg by mouth daily as needed for diarrhea or loose stools.     metFORMIN (GLUMETZA) 500 MG (MOD) 24 hr tablet Take 500 mg by mouth daily with breakfast.     NIFEdipine (PROCARDIA XL/NIFEDICAL XL) 60 MG 24 hr tablet Take 60 mg by mouth daily.     pantoprazole (PROTONIX) 40 MG tablet Take 1 tablet (40 mg total) by mouth daily. 30 tablet 1   promethazine (PHENERGAN) 25 MG tablet Take 25 mg by mouth every 8 (eight) hours as needed for nausea or vomiting.     sertraline (ZOLOFT) 50 MG tablet Take 50 mg by mouth daily.     diclofenac Sodium (VOLTAREN) 1 % GEL Apply 1 application topically daily as needed for mild pain. (Patient not taking: No sig reported)     NOVOLOG FLEXPEN 100 UNIT/ML FlexPen Inject into the skin See admin instructions. PER SLIDING SCALE IF 151-200=2 U; 201-250=4 U; 251-300=6 U; 301-350=8 U; 351-400=10 U. IF LESS THAN 70 OR OVER 350, NOTIFY DOCTOR (Patient not taking: No  sig reported)     Vitamin D, Ergocalciferol, (DRISDOL) 1.25 MG (50000 UT) CAPS capsule Take 50,000 Units by mouth every Friday. (Patient not taking: No sig reported)       Discharge Medications: Please see discharge summary for a list of discharge medications.  Relevant Imaging Results:  Relevant Lab Results:   Additional Information SSN: [id number], LCSW

## 2020-09-18 NOTE — TOC Initial Note (Signed)
Transition of Care [facility]) - Initial/Assessment Note    Patient Details  Name: Erica [name] MRN: [id number] Date of Birth: 1955/05/24  Transition of Care [facility]) CM/SW Contact:    Shade Flood, LCSW Phone Number: 09/18/2020, 11:00 AM  Clinical Narrative:                  Pt admitted from [facility]. Spoke with Melissa at [facility] who states that pt can return at dc. MD anticipating pt will remain hospitalized for at least 2-3 more days.  Contacted pt's son, [name], to update on pt's status. [name] confirms that he would like for pt to return to [facility] at [employer].   TOC will follow and assist with dc planning.  Expected Discharge Plan: Skilled Nursing Facility Barriers to Discharge: Continued Medical Work up   Patient Goals and CMS Choice        Expected Discharge Plan and Services Expected Discharge Plan: [place] In-house Referral: Clinical Social Work     Living arrangements for the past 2 months: [place]                                      Prior Living Arrangements/Services Living arrangements for the past 2 months: [place] Lives with:: Facility Resident Patient language and need for interpreter reviewed:: Yes Do you feel safe going back to the place where you live?: Yes      Need for Family Participation in Patient Care: No (Comment) Care giver support system in place?: Yes (comment)   Criminal Activity/Legal Involvement Pertinent to Current Situation/Hospitalization: No - Comment as needed  Activities of Daily Living      Permission Sought/Granted                  Emotional Assessment       Orientation: : Oriented to Self Alcohol / Substance Use: Not Applicable Psych Involvement: No (comment)  Admission diagnosis:  Hypoglycemia [E16.2] Lactic acidosis [E87.2] Patient Active Problem List   Diagnosis Date Noted   CAD (coronary artery disease) 09/17/2020   Altered  mental status    Acute metabolic encephalopathy 123456   Diarrhea    Pressure injury of skin 06/09/2020   Sacral fracture ([place]) 06/08/2020   Thrombocytopenia ([place]) 06/08/2020   Hypertensive urgency 06/08/2020   (HFpEF) heart failure with preserved ejection fraction ([place]) 06/08/2020   Fall 06/08/2020   Elevated MCV 06/08/2020   Palliative care by specialist    Generalized weakness 11/14/2019   HCAP (healthcare-associated pneumonia) 11/14/2019   Hyperkalemia 11/13/2019   Hyponatremia 09/27/2019   Closed fracture of right proximal humerus 04/20/2019   Surgery, elective    Seizure ([place]) 11/12/2018   History of hydronephrosis --stents in place 12/08/2017   Stroke ([place]) 11/29/2017   Seizures ([place])    Hydronephrosis    Recurrent UTI    Diabetes mellitus type 2 in nonobese ([place])    ESRD (end stage renal disease) ([place])    Anemia of chronic disease    Chronic diastolic congestive heart failure ([place])    Essential hypertension    PICC (peripherally inserted central catheter) in place    Acute pyelonephritis 11/12/2017   Uncontrolled type 2 diabetes mellitus with hyperglycemia, with long-term current use of insulin ([place]) 11/10/2017   Renal failure 11/08/2017   Seizure disorder ([place]) 11/08/2017   Orthostatic hypotension 07/25/2014   Moderate protein malnutrition (  [place]) 09/15/2013   Aspiration pneumonia ([place]) 09/15/2013   Acute respiratory failure requiring reintubation ([place]) 09/11/2013   probable Seizures due to metabolic disorder [id number]   Lactic acidosis 03/19/2013   Abdominal pain 03/19/2013   Rotavirus infection 10/29/2012   Type II or unspecified type diabetes mellitus without mention of complication, uncontrolled 10/29/2012   Protein-calorie malnutrition, severe ([place]) 10/27/2012   NSTEMI (non-ST elevated myocardial infarction) ([place]) 10/26/2012   Fever, unspecified 10/26/2012   Hypotension [id number]   Metabolic acidosis [id number]   Chronic diarrhea 10/25/2012   Tobacco  abuse 10/25/2012   DKA (diabetic ketoacidoses) 09/09/2012   Dehydration 09/09/2012   DKA, type 2 ([place]) 05/20/2012   Abnormal LFTs 05/20/2012   Heart murmur, systolic [id number]   Hypoglycemia XX123456   Metabolic encephalopathy XX123456   Alcohol abuse 07/08/2011   Hypokalemia 07/08/2011   Nausea & vomiting 07/08/2011   H/O chronic pancreatitis 07/08/2011   PCP:  [name], NP Pharmacy:   [facility] [place] [address] Phone: [phone] Fax: [place], [place] [address] Phone: [phone] Fax: [place], [place] - [address] [place] [address] [place] [address] Phone: [phone] Fax: [phone]     Social Determinants of Health (SDOH) Interventions    Readmission Risk Interventions Readmission Risk Prevention Plan 08/20/2020 06/11/2020 11/19/2019  Transportation Screening Complete Complete Complete  Medication Review Press photographer) Complete Complete Complete  PCP or Specialist appointment within 3-5 days of discharge Complete - Complete  HRI or Home Care Consult Complete Complete Complete  SW Recovery Care/Counseling Consult Complete Complete Complete  Palliative Care Screening Not Applicable Not Applicable Complete  Skilled Nursing Facility Complete Complete Complete  Some recent data might be hidden

## 2020-09-18 NOTE — ED Notes (Signed)
Per Dr. Lavell Islam until IV access is obtained give pt. Soda with sugar. Continue with glucose checks and administer gel glucose.

## 2020-09-18 NOTE — Consult Note (Signed)
ESRD Consult Note  Requesting provider: Murlean Iba, MD  Outpatient dialysis unit: [facility] Outpatient dialysis schedule: TTS  Assessment/Recommendations:   ESRD:  -per previous admission outpatient orders are:EDW 42.5kg, Time: 3.75 hours, 2K/ 3 Ca, BFR 400, DFR 600,  Dialyzer: [place] Revacir 200, LUE AVG. Heparin 1000 unit load, 500 units/hr (stop 60 min before end of HD) -will obtain outpatient records -HD today (missed yesterday) -next HD either tomorrow or Friday, depends on inpatient HD census which is currently high  AMS, hypoglycemia, h/o DM2 -on d10 -per primary  Volume/ hypertension: EDW 42.5kg. Attempt to achieve EDW as tolerated. Continue home anti-HTNs  Anemia of Chronic Kidney Disease: Hemoglobin 10.3. monitor for now, will obtain outpatient records  Secondary Hyperparathyroidism/Hyperphosphatemia: c/w home meds and calcitriol   Metabolic acidosis, lactic acidosis -lactate persistently elevated, bicarb improving  Hyperkalemia -resolved, secondary to acidosis likely  Vascular access: LUE AVG   # Additional recommendations: - Dose all meds for creatinine clearance < 10 ml/min  - Unless absolutely necessary, no MRIs with gadolinium.  - Implement save arm precautions.  Prefer needle sticks in the dorsum of the hands or wrists.  No blood pressure measurements in arm. - If blood transfusion is requested during hemodialysis sessions, please alert [place] prior to the session.  - If a hemodialysis catheter line culture is requested, please alert [place] as only hemodialysis nurses are able to collect those specimens.   Recommendations were discussed with the primary team.   History of Present Illness: Erica [name] is a/an 65 y.o. female with a past medical history of ESRD, history of alcohol abuse and alcohol induced pancreatitis, history of CAD, DM 2, hypertension, diastolic dysfunction, depression, history of DKA, gastroparesis, GERD, history of kidney  stones, hyperlipidemia, seizure disorder who presents with altered mental status in setting of hypoglycemia, was found to have a blood glucose of 39.  She did respond to dextrose and was oriented thereafter.  She was found to have profound acidosis and lactic acidosis as well.  Her potassium was 5.4 on presentation and bicarb was 9.  Both of which have been improving. She offers no complaints currently other than feeling cold.   Medications:  Current Facility-Administered Medications  Medication Dose Route Frequency Provider Last Rate Last Admin   albuterol (VENTOLIN HFA) 108 (90 Base) MCG/ACT inhaler 2 puff  2 puff Inhalation Q4H PRN Reubin Milan, MD       [START ON 09/19/2020] calcitRIOL (ROCALTROL) capsule 0.25 mcg  0.25 mcg Oral Q T,Th,Sa-HD Reubin Milan, MD       carvedilol (COREG) tablet 6.25 mg  6.25 mg Oral BID Reubin Milan, MD   6.25 mg at 09/17/20 2200   cinacalcet (SENSIPAR) tablet 30 mg  30 mg Oral QPM Reubin Milan, MD       dextrose (GLUTOSE) 40 % oral gel 31 g  1 Tube Oral Q1H PRN Reubin Milan, MD       dextrose 10 % infusion   Intravenous Continuous Reubin Milan, MD 50 mL/hr at 09/18/20 0403 Infusion Verify at 09/18/20 0403   dextrose 50 % solution 25 g  25 g Intravenous PRN Reubin Milan, MD       enoxaparin (LOVENOX) injection 30 mg  30 mg Subcutaneous Q24H Reubin Milan, MD   30 mg at 09/17/20 2201   ferric citrate (AURYXIA) tablet 420 mg  420 mg Oral TID WC Reubin Milan, MD       folic acid (  FOLVITE) tablet 1 mg  1 mg Oral q morning Reubin Milan, MD       hydrALAZINE (APRESOLINE) tablet 25 mg  25 mg Oral Q8H Reubin Milan, MD       levETIRAcetam (KEPPRA) tablet 1,000 mg  1,000 mg Oral q morning Reubin Milan, MD       NIFEdipine (PROCARDIA-XL/NIFEDICAL-XL) 24 hr tablet 60 mg  60 mg Oral Daily Reubin Milan, MD       pantoprazole (PROTONIX) EC tablet 40 mg  40 mg Oral Daily Reubin Milan, MD        sertraline (ZOLOFT) tablet 50 mg  50 mg Oral Daily Reubin Milan, MD       sodium bicarbonate 1 mEq/mL injection            Current Outpatient Medications  Medication Sig Dispense Refill   acetaminophen (TYLENOL) 325 MG tablet Take 650 mg by mouth every 4 (four) hours as needed for moderate pain.     albuterol (VENTOLIN HFA) 108 (90 Base) MCG/ACT inhaler Inhale 2 puffs into the lungs every 4 (four) hours as needed for wheezing or shortness of breath.      calcitRIOL (ROCALTROL) 0.25 MCG capsule Take 1 capsule (0.25 mcg total) by mouth Every Tuesday,Thursday,and Saturday with dialysis.     carvedilol (COREG) 6.25 MG tablet Take 1 tablet (6.25 mg total) by mouth 2 (two) times daily. 60 tablet 0   cinacalcet (SENSIPAR) 30 MG tablet Take 30 mg by mouth every evening.     ferric citrate (AURYXIA) 1 GM 210 MG(Fe) tablet Take 2 tablets (420 mg total) by mouth 3 (three) times daily with meals. [id number] tablet    folic acid (FOLVITE) 1 MG tablet Take 1 mg by mouth every morning.     hydrALAZINE (APRESOLINE) 25 MG tablet Take 1 tablet (25 mg total) by mouth every 8 (eight) hours.     levETIRAcetam (KEPPRA) 1000 MG tablet Take 1,000 mg by mouth every morning.     loperamide (IMODIUM) 2 MG capsule Take 4 mg by mouth daily as needed for diarrhea or loose stools.     metFORMIN (GLUMETZA) 500 MG (MOD) 24 hr tablet Take 500 mg by mouth daily with breakfast.     NIFEdipine (PROCARDIA XL/NIFEDICAL XL) 60 MG 24 hr tablet Take 60 mg by mouth daily.     pantoprazole (PROTONIX) 40 MG tablet Take 1 tablet (40 mg total) by mouth daily. 30 tablet 1   promethazine (PHENERGAN) 25 MG tablet Take 25 mg by mouth every 8 (eight) hours as needed for nausea or vomiting.     sertraline (ZOLOFT) 50 MG tablet Take 50 mg by mouth daily.     diclofenac Sodium (VOLTAREN) 1 % GEL Apply 1 application topically daily as needed for mild pain. (Patient not taking: No sig reported)     NOVOLOG FLEXPEN 100 UNIT/ML FlexPen Inject  into the skin See admin instructions. PER SLIDING SCALE IF 151-200=2 U; 201-250=4 U; 251-300=6 U; 301-350=8 U; 351-400=10 U. IF LESS THAN 70 OR OVER 350, NOTIFY DOCTOR (Patient not taking: No sig reported)     Vitamin D, Ergocalciferol, (DRISDOL) 1.25 MG (50000 UT) CAPS capsule Take 50,000 Units by mouth every Friday. (Patient not taking: No sig reported)       ALLERGIES Aspirin  MEDICAL HISTORY Past Medical History:  Diagnosis Date   Alcohol-induced pancreatitis    CAD (coronary artery disease) 09/17/2020   Chronic diarrhea    Depression  Diabetes mellitus    fasting blood sugar 110-120s   Dialysis patient ([place])    Diastolic CHF ([place])    DKA (diabetic ketoacidoses)    Gastroparesis    GERD (gastroesophageal reflux disease)    Heart murmur    History of kidney stones    Hyperlipidemia    Hypertension    Hypokalemia    Muscle spasm    Neuropathic pain    Neuropathy    Hx: of   Pyelonephritis    Seizures (HCC)    Vitamin B12 deficiency    Vitamin D deficiency      SOCIAL HISTORY Social History   Socioeconomic History   Marital status: Legally Separated    Spouse name: Not on file   Number of children: Not on file   Years of education: Not on file   Highest education level: Not on file  Occupational History   Not on file  Tobacco Use   Smoking status: Never   Smokeless tobacco: Current    Types: Snuff  Vaping Use   Vaping Use: Never used  Substance and Sexual Activity   Alcohol use: Not Currently    Alcohol/week: 1.0 standard drink    Types: 1 Cans of beer per week   Drug use: No   Sexual activity: Not Currently  Other Topics Concern   Not on file  Social History Narrative   Not on file   Social Determinants of Health   Financial Resource Strain: Not on file  Food Insecurity: Not on file  Transportation Needs: Not on file  Physical Activity: Not on file  Stress: Not on file  Social Connections: Not on file  Intimate Partner Violence: Not on file      FAMILY HISTORY Family History  Problem Relation Age of Onset   Diabetes Sister    Chronic Renal Failure Neg Hx      Review of Systems: 12 systems were reviewed and negative except per HPI  Physical Exam: Vitals:   09/18/20 0300 09/18/20 0530  BP: (!) 121/58 (!) 112/47  Pulse: 92 81  Resp: (!) 21 (!) 21  Temp:    SpO2: 100% 99%   No intake/output data recorded.  Intake/Output Summary (Last 24 hours) at 09/18/2020 0734 Last data filed at 09/17/2020 1947 Gross per 24 hour  Intake 500 ml  Output --  Net 500 ml   General: nad, thin HEENT: anicteric sclera, MMM CV: normal rate, no murmurs, no edema Lungs: bilateral chest rise, normal wob Abd: soft, non-tender, non-distended Skin: no visible lesions or rashes Neuro: normal speech, no gross focal deficits  HD access: lue avg +b/t  Test Results Reviewed Lab Results  Component Value Date   NA 134 (L) 09/18/2020   K 4.5 09/18/2020   CL 93 (L) 09/18/2020   CO2 15 (L) 09/18/2020   BUN 31 (H) 09/18/2020   CREATININE 5.34 (H) 09/18/2020   CALCIUM 9.4 09/18/2020   ALBUMIN 3.5 09/18/2020   PHOS 3.0 08/20/2020    I have reviewed relevant outside healthcare records

## 2020-09-19 ENCOUNTER — Encounter (HOSPITAL_COMMUNITY): Payer: Self-pay | Admitting: Family Medicine

## 2020-09-19 ENCOUNTER — Inpatient Hospital Stay (HOSPITAL_COMMUNITY): Payer: Medicare Other

## 2020-09-19 DIAGNOSIS — E162 Hypoglycemia, unspecified: Secondary | ICD-10-CM | POA: Diagnosis not present

## 2020-09-19 DIAGNOSIS — Z515 Encounter for palliative care: Secondary | ICD-10-CM | POA: Diagnosis not present

## 2020-09-19 DIAGNOSIS — T68XXXA Hypothermia, initial encounter: Secondary | ICD-10-CM

## 2020-09-19 DIAGNOSIS — Z7189 Other specified counseling: Secondary | ICD-10-CM

## 2020-09-19 DIAGNOSIS — N186 End stage renal disease: Secondary | ICD-10-CM

## 2020-09-19 DIAGNOSIS — R4182 Altered mental status, unspecified: Secondary | ICD-10-CM

## 2020-09-19 DIAGNOSIS — R400 Somnolence: Secondary | ICD-10-CM | POA: Diagnosis not present

## 2020-09-19 DIAGNOSIS — E119 Type 2 diabetes mellitus without complications: Secondary | ICD-10-CM | POA: Diagnosis not present

## 2020-09-19 DIAGNOSIS — R718 Other abnormality of red blood cells: Secondary | ICD-10-CM | POA: Diagnosis not present

## 2020-09-19 DIAGNOSIS — E871 Hypo-osmolality and hyponatremia: Secondary | ICD-10-CM

## 2020-09-19 LAB — GLUCOSE, CAPILLARY
Glucose-Capillary: 115 mg/dL — ABNORMAL HIGH (ref 70–99)
Glucose-Capillary: 134 mg/dL — ABNORMAL HIGH (ref 70–99)
Glucose-Capillary: 141 mg/dL — ABNORMAL HIGH (ref 70–99)
Glucose-Capillary: 153 mg/dL — ABNORMAL HIGH (ref 70–99)
Glucose-Capillary: 162 mg/dL — ABNORMAL HIGH (ref 70–99)
Glucose-Capillary: 170 mg/dL — ABNORMAL HIGH (ref 70–99)
Glucose-Capillary: 173 mg/dL — ABNORMAL HIGH (ref 70–99)
Glucose-Capillary: 191 mg/dL — ABNORMAL HIGH (ref 70–99)
Glucose-Capillary: 204 mg/dL — ABNORMAL HIGH (ref 70–99)
Glucose-Capillary: 215 mg/dL — ABNORMAL HIGH (ref 70–99)
Glucose-Capillary: 225 mg/dL — ABNORMAL HIGH (ref 70–99)
Glucose-Capillary: 74 mg/dL (ref 70–99)
Glucose-Capillary: 81 mg/dL (ref 70–99)
Glucose-Capillary: 95 mg/dL (ref 70–99)

## 2020-09-19 LAB — RENAL FUNCTION PANEL
Albumin: 3.3 g/dL — ABNORMAL LOW (ref 3.5–5.0)
Anion gap: 17 — ABNORMAL HIGH (ref 5–15)
BUN: 34 mg/dL — ABNORMAL HIGH (ref 8–23)
CO2: 18 mmol/L — ABNORMAL LOW (ref 22–32)
Calcium: 8.4 mg/dL — ABNORMAL LOW (ref 8.9–10.3)
Chloride: 89 mmol/L — ABNORMAL LOW (ref 98–111)
Creatinine, Ser: 6.44 mg/dL — ABNORMAL HIGH (ref 0.44–1.00)
GFR, Estimated: 7 mL/min — ABNORMAL LOW (ref >60)
Glucose, Bld: 144 mg/dL — ABNORMAL HIGH (ref 70–99)
Phosphorus: 2.7 mg/dL (ref 2.5–4.6)
Potassium: 3.6 mmol/L (ref 3.5–5.1)
Sodium: 124 mmol/L — ABNORMAL LOW (ref 135–145)

## 2020-09-19 LAB — BETA-HYDROXYBUTYRIC ACID: Beta-Hydroxybutyric Acid: 1.98 mmol/L — ABNORMAL HIGH (ref 0.05–0.27)

## 2020-09-19 LAB — CBC
HCT: 31.8 % — ABNORMAL LOW (ref 36.0–46.0)
Hemoglobin: 10.1 g/dL — ABNORMAL LOW (ref 12.0–15.0)
MCH: 34.9 pg — ABNORMAL HIGH (ref 26.0–34.0)
MCHC: 31.8 g/dL (ref 30.0–36.0)
MCV: 110 fL — ABNORMAL HIGH (ref 80.0–100.0)
Platelets: 141 10*3/uL — ABNORMAL LOW (ref 150–400)
RBC: 2.89 MIL/uL — ABNORMAL LOW (ref 3.87–5.11)
RDW: 14.7 % (ref 11.5–15.5)
WBC: 6.9 10*3/uL (ref 4.0–10.5)
nRBC: 0.3 % — ABNORMAL HIGH (ref 0.0–0.2)

## 2020-09-19 LAB — C DIFFICILE QUICK SCREEN W PCR REFLEX
C Diff antigen: NEGATIVE
C Diff interpretation: NOT DETECTED
C Diff toxin: NEGATIVE

## 2020-09-19 LAB — MAGNESIUM: Magnesium: 1.7 mg/dL (ref 1.7–2.4)

## 2020-09-19 LAB — LACTIC ACID, PLASMA: Lactic Acid, Venous: 9.9 mmol/L (ref 0.5–1.9)

## 2020-09-19 MED ORDER — SODIUM CHLORIDE 0.9% FLUSH
10.0000 mL | Freq: Two times a day (BID) | INTRAVENOUS | Status: DC
  Administered 2020-09-19 – 2020-09-23 (×7): 10 mL

## 2020-09-19 MED ORDER — ALBUMIN HUMAN 25 % IV SOLN
12.5000 g | INTRAVENOUS | Status: AC
  Administered 2020-09-19: 12.5 g via INTRAVENOUS

## 2020-09-19 MED ORDER — SODIUM CHLORIDE 0.9% FLUSH
10.0000 mL | INTRAVENOUS | Status: DC | PRN
  Administered 2020-09-22: 10 mL

## 2020-09-19 MED ORDER — LACTATED RINGERS IV BOLUS
500.0000 mL | Freq: Once | INTRAVENOUS | Status: AC
  Administered 2020-09-19: 500 mL via INTRAVENOUS

## 2020-09-19 NOTE — Progress Notes (Signed)
PROGRESS NOTE   Erica [name]  [id number] DOB: [birth year] DOA: 09/17/2020 PCP: Erica Sill, NP   Chief Complaint  Patient presents with   Altered Mental Status   Level of care: Stepdown  Brief Admission History:  65 y.o. female with medical history significant of alcohol induced pancreatitis, alcohol abuse, history of CAD/NSTEMI, depression, type 2 diabetes, ESRD on dialysis (TTS) diastolic dysfunction, history of DKA, gastroparesis, GERD, heart murmur, nephrolithiasis, hyperlipidemia, hypertension, hypokalemia, muscle spasms, neuropathic pain and neuropathy, history of seizures who is coming to the emergency department via EMS due to altered mental status in the setting of hypoglycemia of 39 mg/dL.  She responded to dextrose.  Her mental status has improved.  She is now oriented to name, place, situation and oriented to time and date, except that she thought today was Wednesday.  She also had 2 episodes of emesis.  She denied headache, chest pain, dyspnea, abdominal or back pain at this time.  No orthopnea or PND.   She has had chills, but denied fever.  No rhinorrhea, sore throat, wheezing or hemoptysis. She occasionally gets lower extremity edema.  Denied diarrhea, melena or hematochezia.  She occasionally gets constipated.  She does not urinate much regularly.  No dysuria, frequency or hematuria.  Assessment & Plan:   Principal Problem:   Hypoglycemia Active Problems:   Nausea & vomiting   Metabolic acidosis   Tobacco abuse   Lactic acidosis   Essential hypertension   Diabetes mellitus type 2 in nonobese (HCC)   ESRD (end stage renal disease) (HCC)   Hyponatremia   Hyperkalemia   Elevated MCV   Altered mental status   CAD (coronary artery disease)  Severe lactic acidosis - severe lactic acid elevation likely secondary to metformin.  Not sure how she was started on this.  She was not discharged on this when she was here last month.  Likely was started at the nursing  facility.  With her chronic kidney disease she has developed a severe lactic acidosis secondary to metformin use.  This has been discontinued.  She is being treated supportively with IV fluid hydration and plan to follow lactic acid levels.  Severe metabolic acidosis - Pt seems to have starvation ketosis from not enough insulin.  She is only 44 Kg.  She has been started on low dose IV insulin infusion with dextrose infusion to help clear ketones.  Unfortunately they have not been able to get her labs and she needs a central line placed. I have consulted with surgery and critical care team for assistance with line placement.  This patient is critically ill and mortality risk is very high.  I have asked for a palliative medicine consultation for goals of care.    Hypotension and hypothermia - central line placement requested, Warming blanket ordered.  Critical care MD consultation requested.  Full sepsis work up ordered.  Repeated lactic acid level pending.    Hypoglycemia-patient has no glycemic reserves due to her severely emaciated state.  She has been started on a dextrose infusion.  Continue to follow.  Type 1 diabetes mellitus-patient has very high ketone levels.  Ketones likely will not come down without insulin administration.  I have ordered for low-dose IV insulin infusion at 0.5 units/h with hourly CBG testing to clear ketones.  Continue dextrose infusion I will increase the infusion to 100 cc/h.  Goal to keep blood glucose greater than 100 and to avoid hypoglycemia while clearing ketones.  End-stage renal disease-appreciate nephrology consultation  and they are planning for hemodialysis therapy on 09/19/2020.  Due to her severe low BPs she may need transfer to [facility] for CR[facility].  Defer to nephrology team.    Hyperkalemia-secondary to metabolic acidosis this has been treated and resolved we will continue to monitor closely until acidosis has resolved.  Nausea and vomiting-this is resolved.  Essential  hypertension-patient having some soft blood pressures with temporarily hold home BP meds until improved.  Tobacco-reported by history unable to counsel patient at this time due to current condition  DVT prophylaxis: Enoxaparin Code Status: Full Family Communication: son updated 09/18/20 telephone Disposition: anticipate return to SNF when medically stabilized Status is: Inpatient  Remains inpatient appropriate because:IV treatments appropriate due to intensity of illness or inability to take PO and Inpatient level of care appropriate due to severity of illness  Dispo: The patient is from: SNF              Anticipated d/c is to: SNF              Patient currently is not medically stable to d/c.   Difficult to place patient No   Consultants:  Nephrology   Procedures:  Central line placement 09/19/20   Antimicrobials:  N/a    Subjective: Pt somnolent but arousable. Confused.     Objective: Vitals:   09/19/20 0700 09/19/20 0800 09/19/20 0836 09/19/20 0853  BP: (!) 98/42 (!) 71/45    Pulse: 62 71  74  Resp: (!) 21 (!) 9    Temp:   (!) 93.5 F (34.2 C) (!) 94 F (34.4 C)  TempSrc:   Rectal Rectal  SpO2: 99% 98%  99%  Weight:      Height:        Intake/Output Summary (Last 24 hours) at 09/19/2020 1158 Last data filed at 09/19/2020 [id number] Gross per 24 hour  Intake 2502.08 ml  Output --  Net 2502.08 ml   Filed Weights   09/17/20 1441 09/19/20 0203  Weight: 48 kg 43.6 kg   Examination:  General exam: chronically ill appearing female, lying in bed arousable, oriented to person only, Appears calm and comfortable.  Pt appears emaciated and grossly underweight.  Respiratory system: Clear to auscultation. Respiratory effort normal. Cardiovascular system: normal S1 & S2 heard. No JVD, murmurs, rubs, gallops or clicks. No pedal edema. Gastrointestinal system: Abdomen is nondistended, soft and nontender. No organomegaly or masses felt. Normal bowel sounds heard. Central nervous  system: Alert and oriented. No focal neurological deficits. Extremities: Symmetric 5 x 5 power. Skin: No rashes, lesions or ulcers Psychiatry: Judgement and insight appear poor. Mood & affect flat.    Data Reviewed: I have personally reviewed following labs and imaging studies  CBC: Recent Labs  Lab 09/17/20 1505 09/18/20 0411 09/18/20 2205  WBC 9.2 7.7 9.0  NEUTROABS 5.9  --   --   HGB 13.1 10.3* 11.2*  HCT 44.0 33.7* 36.4  MCV 119.9* 116.2* 114.5*  PLT 266 211 [id number]    Basic Metabolic Panel: Recent Labs  Lab 09/17/20 1958 09/17/20 2345 09/18/20 0411 09/18/20 1840 09/18/20 2205  NA 131* 132* 134* 129* 129*  K 4.9 4.7 4.5 5.0 4.3  CL 95* 93* 93* 93* 91*  CO2 12* 14* 15* 11* 16*  GLUCOSE 74 83 127* 141* 120*  BUN 29* 29* 31* 33* 32*  CREATININE 5.09* 5.32* 5.34* 5.91* 5.94*  CALCIUM 9.5 9.3 9.4 8.9 9.3  PHOS  --   --   --   --  3.6    GFR: Estimated Creatinine Clearance: 6.5 mL/min (A) (by C-G formula based on SCr of 5.94 mg/dL (H)).  Liver Function Tests: Recent Labs  Lab 09/17/20 1505 09/18/20 0411 09/18/20 2205  AST 39 24  --   ALT 21 16  --   ALKPHOS 189* 147*  --   BILITOT 1.2 1.1  --   PROT 8.2* 6.5  --   ALBUMIN 4.2 3.5 3.6    CBG: Recent Labs  Lab 09/19/20 0602 09/19/20 0703 09/19/20 0833 09/19/20 0927 09/19/20 1033  GLUCAP 191* 153* 170* 225* 204*    Recent Results (from the past 240 hour(s))  Resp Panel by RT-PCR (Flu A&B, Covid) Nasopharyngeal Swab     Status: None   Collection Time: 09/17/20  6:01 PM   Specimen: Nasopharyngeal Swab; Nasopharyngeal(NP) swabs in vial transport medium  Result Value Ref Range Status   SARS Coronavirus 2 by RT PCR NEGATIVE NEGATIVE Final    Comment: (NOTE) SARS-CoV-2 target nucleic acids are NOT DETECTED.  The SARS-CoV-2 RNA is generally detectable in upper respiratory specimens during the acute phase of infection. The lowest concentration of SARS-CoV-2 viral copies this assay can detect is 138  copies/mL. A negative result does not preclude SARS-Cov-2 infection and should not be used as the sole basis for treatment or other patient management decisions. A negative result may occur with  improper specimen collection/handling, submission of specimen other than nasopharyngeal swab, presence of viral mutation(s) within the areas targeted by this assay, and inadequate number of viral copies(<138 copies/mL). A negative result must be combined with clinical observations, patient history, and epidemiological information. The expected result is Negative.  Fact Sheet for Patients:  EntrepreneurPulse.com.au  Fact Sheet for Healthcare Providers:  IncredibleEmployment.be  This test is no t yet approved or cleared by the [place] FDA and  has been authorized for detection and/or diagnosis of SARS-CoV-2 by FDA under an Emergency Use Authorization (EUA). This EUA will remain  in effect (meaning this test can be used) for the duration of the COVID-19 declaration under Section 564(b)(1) of the Act, 21 U.S.C.section 360bbb-3(b)(1), unless the authorization is terminated  or revoked sooner.       Influenza A by PCR NEGATIVE NEGATIVE Final   Influenza B by PCR NEGATIVE NEGATIVE Final    Comment: (NOTE) The Xpert Xpress SARS-CoV-2/FLU/RSV plus assay is intended as an aid in the diagnosis of influenza from Nasopharyngeal swab specimens and should not be used as a sole basis for treatment. Nasal washings and aspirates are unacceptable for Xpert Xpress SARS-CoV-2/FLU/RSV testing.  Fact Sheet for Patients: EntrepreneurPulse.com.au  Fact Sheet for Healthcare Providers: IncredibleEmployment.be  This test is not yet approved or cleared by the [place] FDA and has been authorized for detection and/or diagnosis of SARS-CoV-2 by FDA under an Emergency Use Authorization (EUA). This EUA will remain in effect (meaning  this test can be used) for the duration of the COVID-19 declaration under Section 564(b)(1) of the Act, 21 U.S.C. section 360bbb-3(b)(1), unless the authorization is terminated or revoked.  Performed at [facility], [address] [place], [place] [address]   MRSA Next Gen by PCR, Nasal     Status: None   Collection Time: 09/18/20  8:48 PM   Specimen: Nasal Mucosa; Nasal Swab  Result Value Ref Range Status   MRSA by PCR Next Gen NOT DETECTED NOT DETECTED Final    Comment: (NOTE) The GeneXpert MRSA Assay (FDA approved for NASAL specimens only), is one component of  a comprehensive MRSA colonization surveillance program. It is not intended to diagnose MRSA infection nor to guide or monitor treatment for MRSA infections. Test performance is not FDA approved in patients less than 41 years old. Performed at [facility], [address], [place], [place] [address]      Radiology Studies: [facility] 1 View  Result Date: 09/17/2020 CLINICAL DATA:  Weakness and altered mental status. Missed dialysis. EXAM: PORTABLE CHEST 1 VIEW COMPARISON:  August 15, 2020. FINDINGS: Trachea is midline. Cardiomediastinal contours with cardiac enlargement as before. Fullness of central pulmonary vasculature. Hilar structures with stable appearance. No lobar consolidative changes. No sign of pleural effusion. EKG leads projecting over the chest. No acute skeletal process on limited assessment. IMPRESSION: Cardiomegaly with vascular congestion. No change from the prior study. Electronically Signed   By: Zetta Bills M.D.   On: 09/17/2020 15:47    Scheduled Meds:  calcitRIOL  0.25 mcg Oral Q T,Th,Sa-HD   Chlorhexidine Gluconate Cloth  6 each Topical Q0600   cinacalcet  30 mg Oral QPM   enoxaparin (LOVENOX) injection  30 mg Subcutaneous Q24H   ferric citrate  420 mg Oral TID WC   folic acid  1 mg Oral q morning   levETIRAcetam  1,000 mg Oral q morning   pantoprazole  40 mg Oral Daily   sertraline  50 mg Oral  Daily   Continuous Infusions:  sodium chloride     sodium chloride     dextrose 100 mL/hr at 09/19/20 0648   insulin 0.5 Units/hr (09/19/20 0648)     LOS: 1 day   Critical Care Procedure Note Authorized and Performed by: Murvin Natal MD  Total Critical Care time:  40 mins  Due to a high probability of clinically significant, life threatening deterioration, the patient required my highest level of preparedness to intervene emergently and I personally spent this critical care time directly and personally managing the patient.  This critical care time included obtaining a history; examining the patient, pulse oximetry; ordering and review of studies; arranging urgent treatment with development of a management plan; evaluation of patient's response of treatment; frequent reassessment; and discussions with other providers.  This critical care time was performed to assess and manage the high probability of imminent and life threatening deterioration that could result in multi-organ failure.  It was exclusive of separately billable procedures and treating other patients and teaching time.    Irwin Brakeman, MD How to contact the [facility] Attending or Consulting provider [place] or covering provider during after hours [place], for this patient?  Check the care team in [facility] and look for a) attending/consulting [facility] provider listed and b) the [facility] team listed Log into [email or web address] and use [place]'s universal password to access. If you do not have the password, please contact the hospital operator. Locate the [facility] provider you are looking for under [facility] and page to a number that you can be directly reached. If you still have difficulty reaching the provider, please page the [facility] (Director on Call) for the Hospitalists listed on amion for assistance.  09/19/2020, 11:59 AM

## 2020-09-19 NOTE — CV Procedure (Signed)
[facility] night shift femoral CVC placement note.  Under aseptic conditions, after localizing anatomical landmarks and confirming with [place], a right femoral CVC was attempted.  We were able to cannulized the right femoral vein easily, but we were unable to advance the guiding wire and the rest of the procedure was canceled.  There was no significant blood loss.  Hemostasis obtained with pressure of the area for several minutes.  Tennis Must, MD.

## 2020-09-19 NOTE — Consult Note (Signed)
Consultation Note Date: 09/19/2020   Patient Name: Erica [name]  DOB: Dec 06, 1955  MRN: [id number]  Age / Sex: 65 y.o., female  PCP: [name], NP Referring Physician: Murlean Iba, MD  Reason for Consultation: Establishing goals of care  HPI/Patient Profile: 65 y.o. female  with past medical history of alcohol abuse with alcohol induced pancreatitis, history of CAD/NSTEMI, DM2, ESRD on HD, diastolic dysfunction, gastroparesis, GERD, heart murmur, HTN/HLD, kidney stones, neuropathic pain and neuropathy, history of seizure disorder, depression, admitted on 09/17/2020 with hypoglycemia with CBG of 39.   Clinical Assessment and Goals of Care: I have reviewed medical records including EPIC notes, labs and imaging, received report from RN, assessed the patient.  Erica [name] is lying quietly in bed.  She appears acutely/chronically ill, very frail.  She has temporal wasting.  She is alert, oriented to self only.  I do believe that she can make her basic needs known.  There is no family at bedside at this time.  Conference with attending and bedside nursing staff related to patient condition, needs.    Call to son, [name], to discuss diagnosis prognosis, [place], EOL wishes, disposition and options. I introduced Palliative Medicine as specialized medical care for people living with serious illness. It focuses on providing relief from the symptoms and stress of a serious illness. The goal is to improve quality of life for both the patient and the family.  We discussed a brief life review of the patient.  [name] shares that Erica [name] moved into his home about 4 or 5 years ago.  He shares that he has lots of family support, his girlfriend is a Quarry manager and his niece who lives next-door, is also a Quarry manager.  He shares that Erica [name] has been on hemodialysis for about 3 years now, but has had diabetes for many  years.  We then focused on their current illness and the treatment plan including, but not limited to, medications, specialty consults, attempts at central line.  We talked about time for outcomes.  Discussed the importance of continued conversation with family and the medical providers regarding overall plan of care and treatment options, ensuring decisions are within the context of the patient's values and GOCs. Questions and concerns were addressed.  The family was encouraged to call with questions or concerns.  PMT will continue to support holistically.  Conference with attending, bedside nursing staff, transition of care team. PMT to continue to follow  HCPOA  NEXT OF KIN -son, [name] is healthcare surrogate.  Erica [name] has a daughter, but she is not involved in [name] care and per [name] is "an addict".    SUMMARY OF RECOMMENDATIONS   At this point full scope/full code Time for outcomes Continue goals of care discussions   Code Status/Advance Care Planning: Full code  Symptom Management:  Per hospitalist, no additional needs at this time.  Palliative Prophylaxis:  Frequent Pain Assessment, Oral Care, and Turn Reposition  Additional Recommendations (Limitations, Scope, Preferences): Full Scope Treatment  Psycho-social/Spiritual:  Desire for further Chaplaincy support:no Additional Recommendations: Caregiving  Support/Resources  Prognosis:  Unable to determine, based on outcomes.  Guarded at this point  Discharge Planning:  To be determined, based on outcomes.  Possible return to [facility].       Primary Diagnoses: Present on Admission:  Hypoglycemia  Altered mental status  Nausea & vomiting  Metabolic acidosis  Tobacco abuse  ESRD (end stage renal disease) (HCC)  Elevated MCV  Hyponatremia  Essential hypertension  Hyperkalemia  CAD (coronary artery disease)  Lactic acidosis   I have reviewed the medical record, interviewed the patient  and family, and examined the patient. The following aspects are pertinent.  Past Medical History:  Diagnosis Date   Alcohol-induced pancreatitis    CAD (coronary artery disease) 09/17/2020   Chronic diarrhea    Depression    Diabetes mellitus    fasting blood sugar 110-120s   Dialysis patient ([place])    Diastolic CHF ([place])    DKA (diabetic ketoacidoses)    Gastroparesis    GERD (gastroesophageal reflux disease)    Heart murmur    History of kidney stones    Hyperlipidemia    Hypertension    Hypokalemia    Muscle spasm    Neuropathic pain    Neuropathy    Hx: of   Pyelonephritis    Seizures (HCC)    Vitamin B12 deficiency    Vitamin D deficiency    Social History   Socioeconomic History   Marital status: Legally Separated    Spouse name: Not on file   Number of children: Not on file   Years of education: Not on file   Highest education level: Not on file  Occupational History   Not on file  Tobacco Use   Smoking status: Never   Smokeless tobacco: Current    Types: Snuff  Vaping Use   Vaping Use: Never used  Substance and Sexual Activity   Alcohol use: Not Currently    Alcohol/week: 1.0 standard drink    Types: 1 Cans of beer per week   Drug use: No   Sexual activity: Not Currently  Other Topics Concern   Not on file  Social History Narrative   Not on file   Social Determinants of Health   Financial Resource Strain: Not on file  Food Insecurity: Not on file  Transportation Needs: Not on file  Physical Activity: Not on file  Stress: Not on file  Social Connections: Not on file   Family History  Problem Relation Age of Onset   Diabetes Sister    Chronic Renal Failure Neg Hx    Scheduled Meds:  calcitRIOL  0.25 mcg Oral Q T,Th,Sa-HD   Chlorhexidine Gluconate Cloth  6 each Topical Q0600   cinacalcet  30 mg Oral QPM   enoxaparin (LOVENOX) injection  30 mg Subcutaneous Q24H   ferric citrate  420 mg Oral TID WC   folic acid  1 mg Oral q morning    levETIRAcetam  1,000 mg Oral q morning   pantoprazole  40 mg Oral Daily   sertraline  50 mg Oral Daily   Continuous Infusions:  sodium chloride     sodium chloride     dextrose 100 mL/hr at 09/19/20 1456   insulin 0.5 Units/hr (09/19/20 1456)   PRN Meds:.sodium chloride, sodium chloride, albuterol, alteplase, dextrose, dextrose, heparin, lidocaine (PF), lidocaine-prilocaine, pentafluoroprop-tetrafluoroeth Medications Prior to Admission:  Prior to Admission medications   Medication Sig Start Date End  Date Taking? Authorizing Provider  acetaminophen (TYLENOL) 325 MG tablet Take 650 mg by mouth every 4 (four) hours as needed for moderate pain.   Yes [provider]  albuterol (VENTOLIN HFA) 108 (90 Base) MCG/ACT inhaler Inhale 2 puffs into the lungs every 4 (four) hours as needed for wheezing or shortness of breath.    Yes [provider]  calcitRIOL (ROCALTROL) 0.25 MCG capsule Take 1 capsule (0.25 mcg total) by mouth Every Tuesday,Thursday,and Saturday with dialysis. 12/18/17  Yes Love, Ivan Anchors, PA-C  carvedilol (COREG) 6.25 MG tablet Take 1 tablet (6.25 mg total) by mouth 2 (two) times daily. 11/15/18  Yes Buriev, Arie Sabina, MD  cinacalcet (SENSIPAR) 30 MG tablet Take 30 mg by mouth every evening.   Yes [provider]  ferric citrate (AURYXIA) 1 GM 210 MG(Fe) tablet Take 2 tablets (420 mg total) by mouth 3 (three) times daily with meals. 11/15/18  Yes Buriev, Arie Sabina, MD  folic acid (FOLVITE) 1 MG tablet Take 1 mg by mouth every morning.   Yes [provider]  hydrALAZINE (APRESOLINE) 25 MG tablet Take 1 tablet (25 mg total) by mouth every 8 (eight) hours. 11/19/19  Yes Oswald Hillock, MD  levETIRAcetam (KEPPRA) 1000 MG tablet Take 1,000 mg by mouth every morning.   Yes [provider]  loperamide (IMODIUM) 2 MG capsule Take 4 mg by mouth daily as needed for diarrhea or loose stools.   Yes [provider]  metFORMIN (GLUMETZA) 500 MG (MOD)  24 hr tablet Take 500 mg by mouth daily with breakfast.   Yes [provider]  NIFEdipine (PROCARDIA XL/NIFEDICAL XL) 60 MG 24 hr tablet Take 60 mg by mouth daily. 06/14/20  Yes [provider]  pantoprazole (PROTONIX) 40 MG tablet Take 1 tablet (40 mg total) by mouth daily. 11/19/19 10/26/20 Yes Oswald Hillock, MD  promethazine (PHENERGAN) 25 MG tablet Take 25 mg by mouth every 8 (eight) hours as needed for nausea or vomiting. 07/16/20  Yes [provider]  sertraline (ZOLOFT) 50 MG tablet Take 50 mg by mouth daily. 07/16/20  Yes [provider]  diclofenac Sodium (VOLTAREN) 1 % GEL Apply 1 application topically daily as needed for mild pain. Patient not taking: No sig reported 08/16/20   [provider]  NOVOLOG FLEXPEN 100 UNIT/ML FlexPen Inject into the skin See admin instructions. PER SLIDING SCALE IF 151-200=2 U; 201-250=4 U; 251-300=6 U; 301-350=8 U; 351-400=10 U. IF LESS THAN 70 OR OVER 350, NOTIFY DOCTOR Patient not taking: No sig reported 07/16/20   [provider]  Vitamin D, Ergocalciferol, (DRISDOL) 1.25 MG (50000 UT) CAPS capsule Take 50,000 Units by mouth every Friday. Patient not taking: No sig reported    [provider]   Allergies  Allergen Reactions   Aspirin Palpitations and Other (See Comments)    Listed on [facility] 11/12/18   Review of Systems  Unable to perform ROS: Acuity of condition   Physical Exam Vitals and nursing note reviewed.  Constitutional:      General: She is not in acute distress.    Appearance: She is ill-appearing.  HENT:     Mouth/Throat:     Mouth: Mucous membranes are moist.  Cardiovascular:     Rate and Rhythm: Normal rate.  Pulmonary:     Effort: Pulmonary effort is normal. No respiratory distress.  Skin:    General: Skin is warm and dry.  Neurological:     Mental Status: She is alert.  Comments: Oriented to self only  Psychiatric:     Comments: Calm and cooperative    Vital Signs:  BP (!) 103/47   Pulse 77   Temp (!) 94.9 F (34.9 C) (Rectal)   Resp 18   Ht 5' (1.524 m)   Wt 43.6 kg   SpO2 100%   BMI 18.77 kg/m  Pain Scale: CPOT POSS *See Group Information*: 1-Acceptable,Awake and alert Pain Score: 10-Worst pain ever   SpO2: SpO2: 100 % O2 Device:SpO2: 100 % O2 Flow Rate: .O2 Flow Rate (L/min): 0 L/min  IO: Intake/output summary:  Intake/Output Summary (Last 24 hours) at 09/19/2020 1556 Last data filed at 09/19/2020 1456 Gross per 24 hour  Intake 3312.67 ml  Output --  Net 3312.67 ml    LBM: Last BM Date: 09/19/20 Baseline Weight: Weight: 48 kg Most recent weight: Weight: 43.6 kg     Palliative Assessment/Data:   Flowsheet Rows    Flowsheet Row Most Recent Value  Intake Tab   Referral Department Hospitalist  Unit at Time of Referral Intermediate Care Unit  Palliative Care Primary Diagnosis Nephrology  Date Notified 09/19/20  Palliative Care Type New Palliative care  Reason for referral Clarify Goals of Care  Date of Admission 09/17/20  Date first seen by Palliative Care 09/19/20  # of days Palliative referral response time 0 Day(s)  # of days IP prior to Palliative referral 2  Clinical Assessment   Palliative Performance Scale Score 20%  Pain Max last 24 hours Not able to report  Pain Min Last 24 hours Not able to report  Dyspnea Max Last 24 Hours Not able to report  Dyspnea Min Last 24 hours Not able to report  Psychosocial & Spiritual Assessment   Palliative Care Outcomes        Time In: 1000 Time Out: 1050 Time Total: 50 minutes  Greater than 50%  of this time was spent counseling and coordinating care related to the above assessment and plan.  Signed by: Drue Novel, NP   Please contact Palliative Medicine Team phone at [phone] for questions and concerns.  For individual provider: See Shea Evans

## 2020-09-19 NOTE — Progress Notes (Signed)
Dr. Arnoldo Morale notified that patient needed central line catheter placed.

## 2020-09-19 NOTE — Progress Notes (Signed)
Spoke with pt's son over the phone.  He states his mother was in good state of health prior to this admission.  He feels she needs more time, but she always has bounced back.  He is agreeable to do therapies to get her better, including central line placement.  Chesley Mires, MD [place] Pager - [phone] 09/19/2020, 1:34 PM

## 2020-09-19 NOTE — Progress Notes (Signed)
[facility] Progress Note    Assessment/ Plan:   ESRD:  -per previous admission outpatient orders are:EDW 42.5kg, Time: 3.75 hours, 2K/ 3 Ca, BFR 400, DFR 600,  Dialyzer: [place] Revacir 200, LUE AVG. Heparin 1000 unit load, 500 units/hr (stop 60 min before end of HD) -will obtain outpatient records -HD today but will be challenging due to no labs, hypotension. Will keep net even but if not improving then will not able to run on HD and may need to be transferred to [facility] for potential CRRT (depending on palliative care's assessment)  Hypothermia -sepsis? -not on abx currently -warming blanket - per primary  Hypotension Hold anti-HTN's, HD will be challenging to perform, UF goal today: net even to 1L (if possible)   AMS, hypoglycemia, h/o DM2 -BG improved -per primary   Volume/ h/o hypertension: EDW 42.5kg. Attempt to achieve EDW as tolerated.   Anemia of Chronic Kidney Disease: Hemoglobin 10.3. monitor for now, will obtain outpatient records   Secondary Hyperparathyroidism/Hyperphosphatemia: c/w home meds and calcitriol   Anion Gap Metabolic acidosis, lactic acidosis -lactate persistently elevated, bicarb improving   Hyperkalemia -resolved, secondary to acidosis likely   Vascular access: LUE AVG   # Additional recommendations: - Dose all meds for creatinine clearance < 10 ml/min  - Unless absolutely necessary, no MRIs with gadolinium.  - Implement save arm precautions.  Prefer needle sticks in the dorsum of the hands or wrists.  No blood pressure measurements in arm. - If blood transfusion is requested during hemodialysis sessions, please alert [place] prior to the session. - If a hemodialysis catheter line culture is requested, please alert [place] as only hemodialysis nurses are able to collect those specimens.  Gean Quint, MD [facility]  Subjective:   Hypothermic and hypotensive now. No adequate IV access, no labs today. Unable to place central  line last night, orders for central line placement. Palliative consulted per staff   Objective:   BP (!) 71/45   Pulse 74   Temp (!) 94 F (34.4 C) (Rectal)   Resp (!) 9   Ht 5' (1.524 m)   Wt 43.6 kg   SpO2 99%   BMI 18.77 kg/m   Intake/Output Summary (Last 24 hours) at 09/19/2020 0932 Last data filed at 09/19/2020 [id number] Gross per 24 hour  Intake 2502.08 ml  Output --  Net 2502.08 ml   Weight change: -4.4 kg  Physical Exam: Gen:ill appearing, frail CVS:s1s2, rrr Resp:cta bl [id number] Ext:no edema, iv access rle Neuro: lethargic but arousable, pocketing pill in her mouth (being addressed by staff currently) HD access: lue avg +b/t  Imaging: DG Chest Port 1 View  Result Date: 09/17/2020 CLINICAL DATA:  Weakness and altered mental status. Missed dialysis. EXAM: PORTABLE CHEST 1 VIEW COMPARISON:  August 15, 2020. FINDINGS: Trachea is midline. Cardiomediastinal contours with cardiac enlargement as before. Fullness of central pulmonary vasculature. Hilar structures with stable appearance. No lobar consolidative changes. No sign of pleural effusion. EKG leads projecting over the chest. No acute skeletal process on limited assessment. IMPRESSION: Cardiomegaly with vascular congestion. No change from the prior study. Electronically Signed   By: Zetta Bills M.D.   On: 09/17/2020 15:47    Labs: BMET Recent Labs  Lab 09/17/20 1505 09/17/20 1958 09/17/20 2345 09/18/20 0411 09/18/20 1840 09/18/20 2205  NA 130* 131* 132* 134* 129* 129*  K 5.4* 4.9 4.7 4.5 5.0 4.3  CL 92* 95* 93* 93* 93* 91*  CO2 9* 12* 14* 15* 11* 16*  GLUCOSE 42* 74 83 127* 141* 120*  BUN 27* 29* 29* 31* 33* 32*  CREATININE 5.02* 5.09* 5.32* 5.34* 5.91* 5.94*  CALCIUM 9.9 9.5 9.3 9.4 8.9 9.3  PHOS  --   --   --   --   --  3.6   CBC Recent Labs  Lab 09/17/20 1505 09/18/20 0411 09/18/20 2205  WBC 9.2 7.7 9.0  NEUTROABS 5.9  --   --   HGB 13.1 10.3* 11.2*  HCT 44.0 33.7* 36.4  MCV 119.9* 116.2* 114.5*   PLT 266 211 187    Medications:     calcitRIOL  0.25 mcg Oral Q T,Th,Sa-HD   carvedilol  6.25 mg Oral BID   Chlorhexidine Gluconate Cloth  6 each Topical Q0600   cinacalcet  30 mg Oral QPM   enoxaparin (LOVENOX) injection  30 mg Subcutaneous Q24H   ferric citrate  420 mg Oral TID WC   folic acid  1 mg Oral q morning   hydrALAZINE  25 mg Oral Q8H   levETIRAcetam  1,000 mg Oral q morning   NIFEdipine  60 mg Oral Daily   pantoprazole  40 mg Oral Daily   sertraline  50 mg Oral Daily      Gean Quint, MD [facility] 09/19/2020, 9:32 AM

## 2020-09-19 NOTE — Progress Notes (Signed)
CRITICAL VALUE STICKER  CRITICAL VALUE: lactic acid 9.9   RECEIVER (on-site recipient of call): ashley shelton, rn  DATE & TIME NOTIFIED: 09/19/20 1839  MESSENGER (representative from lab):    MD NOTIFIED:   [place]: [id number]  RESPONSE:

## 2020-09-19 NOTE — Progress Notes (Addendum)
TRH night shift.  The nursing staff tried multiple times to obtain a peripheral IV unsuccessfully.  I ordered PICC line insertion for the morning.  I tried to place a right femoral CVC, was able to cannulized the right femoral vein, but I was unable to pass the guiding wire due to resistance on the site.  Given the urgency of obtaining IV access, I ordered the nursing staff to place a line in her LLE for a few hours until the PICC line team is able to place a more advanced line.  Tennis Must, MD.  [id number] the staff reports of the patient's blood pressure was 82/36 mmHg.  LR 250 mL bolus ordered.

## 2020-09-19 NOTE — Progress Notes (Signed)
PICC team notified this RN that patient is unable to have a PICC line placed due to being a dialysis patient. Fem line placement last night was unsuccessful. Order placed for central line. Dr. Wynetta Emery made aware.

## 2020-09-19 NOTE — Progress Notes (Addendum)
Dr Wynetta Emery stated in secure chat that Dr Gean Quint Nephrologist said it is okay to insert Midline for patient.

## 2020-09-19 NOTE — Progress Notes (Signed)
Spoke with Caryl Pina RN about PICC placement. Patient is  actively receiving  dialysis and recommendations are to place an IJ or [place] central line. We use the veins in the upper arms that are used to place fistulas therefor we have to have nephrology approval in order to place a PICC line. And can not place it if there are old grafts or fistulas in the arm.RN to notify MD.

## 2020-09-19 NOTE — Progress Notes (Signed)
Dr. Wynetta Emery notified of patient's rectal temp of 93.5. Warming blanket ordered. And applied.

## 2020-09-19 NOTE — Progress Notes (Signed)
Attempted to place Lt IJ and then Lt subclavian central venous catheter using ultrasound guidance.  Once entering vessel with needle, unable to advance guidewire.  Suspect she has multiple areas of thrombosis in setting of ESRD with prior access placements.  This was same issue when femoral central line attempted last night.  Will f/u chest xray to make sure no complications after attempted central line placement.  Likely will need to have IR place central venous catheter or check if renal will permit midline PICC placement.  Chesley Mires, MD [place] Pager - [phone] 09/19/2020, 2:25 PM

## 2020-09-19 NOTE — Consult Note (Signed)
[facility]   Patient name: Erica [name] Admit date: 09/17/2020  DOB: October 14, 1955 LOS: 1  MRN: [id number] Consult date: 09/19/20  Referring provider: Dr. Wynetta Emery, [facility]: Altered mental status    History:  65 yo female brought to ER with altered mental status.  Apparently had refused outpt dialysis.  CBG by EMS 61 and then 39 in ER.  She was started on dextrose infusion.  She developed lactic acidosis, hypothermia and hypotension.  Difficulty with IV access.  Pt confused and not able to provide additional information.  Past medical history:  ETOH induced chronic pancreatitis, CAD, Depression, DM type 2, ESRD on HD, Chronic diastolic CHF, Gastroparesis, HLD, HTN, Neuropathy, Seizures  Significant events:  8/02 Admit 8/03 Nephrology consulted  Studies:    Micro:    Lines:     Antibiotics:    Consults:  Nephrology Palliative care    Interim history:    Vital signs:  BP (!) 71/45   Pulse 74   Temp (!) 94.9 F (34.9 C) (Rectal)   Resp (!) 9   Ht 5' (1.524 m)   Wt 43.6 kg   SpO2 99%   BMI 18.77 kg/m   Intake/output:  I/O last 3 completed shifts: In: 3002.1 [I.V.:2002.1; IV Piggyback:1000] Out: -    Physical exam:   General - cachectic Eyes - pupils reactive ENT - no sinus tenderness, no stridor Cardiac - regular rate/rhythm, no murmur Chest - equal breath sounds b/l, no wheezing or rales Abdomen - soft, non tender, + bowel sounds Extremities - decreased muscle bulk, AV fistula in Lt arm Skin - no rashes Neuro - moves extremities, follows simple commands  Best practice:   DVT - lovenox SUP - Protonix Nutrition - Renal/carb modified Mobility - bed rest   Assessment/plan:   Hypotension, hypothermia, altered mental status with lactic acidosis in setting of hypoglycemia. - difficult IV access - will d/w family to get consent for central line placement - continue dextrose in IV fluids -  will likely need pressor to keep MAP > 60, SBP > 90 - check TSH, cortisol - f/u lactic acid level  ESRD. - HD per nephrology - might need CRRT  Diabetes poorly controlled. - per primary team  Hx of seizures. - continue keppra  Hx of depression. - continue zoloft  Severe protein calorie malnutrition in setting of chronic alcohol induced pancreatitis. - continue diet  Goals of care. - palliative care consulted   Resolved hospital problems:    Goals of care/Family discussions:  Code status: full  Labs:   CMP Latest Ref Rng & Units 09/18/2020 09/18/2020 09/18/2020  Glucose 70 - 99 mg/dL 120(H) 141(H) 127(H)  BUN 8 - 23 mg/dL 32(H) 33(H) 31(H)  Creatinine 0.44 - 1.00 mg/dL 5.94(H) 5.91(H) 5.34(H)  Sodium 135 - 145 mmol/L 129(L) 129(L) 134(L)  Potassium 3.5 - 5.1 mmol/L 4.3 5.0 4.5  Chloride 98 - 111 mmol/L 91(L) 93(L) 93(L)  CO2 22 - 32 mmol/L 16(L) 11(L) 15(L)  Calcium 8.9 - 10.3 mg/dL 9.3 8.9 9.4  Total Protein 6.5 - 8.1 g/dL - - 6.5  Total Bilirubin 0.3 - 1.2 mg/dL - - 1.1  Alkaline Phos 38 - 126 U/L - - 147(H)  AST 15 - 41 U/L - - 24  ALT 0 - 44 U/L - - 16    CBC Latest Ref Rng & Units 09/18/2020 09/18/2020 09/17/2020  WBC 4.0 - 10.5 K/uL 9.0 7.7 9.2  Hemoglobin 12.0 - 15.0 g/dL  11.2(L) 10.3(L) 13.1  Hematocrit 36.0 - 46.0 % 36.4 33.7(L) 44.0  Platelets 150 - 400 K/uL 187 211 266    ABG    Component Value Date/Time   PHART 7.272 (L) 11/09/2017 1030   PCO2ART 21.0 (L) 11/09/2017 1030   PO2ART 82.8 (L) 11/09/2017 1030   HCO3 10.3 (L) 09/17/2020 1958   TCO2 26 04/20/2019 0716   ACIDBASEDEF 17.9 (H) 09/17/2020 1958   O2SAT 73.4 09/17/2020 1958    CBG (last 3)  Recent Labs    09/19/20 0927 09/19/20 1033 09/19/20 1210  GLUCAP 225* 204* 215*     Past surgical history:  She  has a past surgical history that includes Colonoscopy (02/24/2010); Tubal ligation; Multiple extractions with alveoloplasty (N/A, 06/13/2012); Cataract extraction w/PHACO (Right,  03/14/2015); Cataract extraction w/PHACO (Left, 04/11/2015); Ureteral stent placement (09/2017); cystoscopy with ureteral stent  (Bilateral, 10/07/2017); IR US Guide Vasc Access Right (11/17/2017); IR Fluoro Guide CV Line Right (11/17/2017); IR Fluoro Guide CV Line Right (11/22/2017); AV fistula placement (Left, 12/15/2017); IR Removal Tun Cv Cath W/O FL (01/26/2018); and ORIF humerus fracture (Right, 04/20/2019).  Social history:  She  reports that she has never smoked. Her smokeless tobacco use includes snuff. She reports previous alcohol use of about 1.0 standard drink of alcohol per week. She reports that she does not use drugs.   Review of systems:  Unable to obtain.  Family history:  Her family history includes Diabetes in her sister.    Medications:   No current facility-administered medications on file prior to encounter.   Current Outpatient Medications on File Prior to Encounter  Medication Sig   acetaminophen (TYLENOL) 325 MG tablet Take 650 mg by mouth every 4 (four) hours as needed for moderate pain.   albuterol (VENTOLIN HFA) 108 (90 Base) MCG/ACT inhaler Inhale 2 puffs into the lungs every 4 (four) hours as needed for wheezing or shortness of breath.    calcitRIOL (ROCALTROL) 0.25 MCG capsule Take 1 capsule (0.25 mcg total) by mouth Every Tuesday,Thursday,and Saturday with dialysis.   carvedilol (COREG) 6.25 MG tablet Take 1 tablet (6.25 mg total) by mouth 2 (two) times daily.   cinacalcet (SENSIPAR) 30 MG tablet Take 30 mg by mouth every evening.   ferric citrate (AURYXIA) 1 GM 210 MG(Fe) tablet Take 2 tablets (420 mg total) by mouth 3 (three) times daily with meals.   folic acid (FOLVITE) 1 MG tablet Take 1 mg by mouth every morning.   hydrALAZINE (APRESOLINE) 25 MG tablet Take 1 tablet (25 mg total) by mouth every 8 (eight) hours.   levETIRAcetam (KEPPRA) 1000 MG tablet Take 1,000 mg by mouth every morning.   loperamide (IMODIUM) 2 MG capsule Take 4 mg by mouth daily as needed for  diarrhea or loose stools.   metFORMIN (GLUMETZA) 500 MG (MOD) 24 hr tablet Take 500 mg by mouth daily with breakfast.   NIFEdipine (PROCARDIA XL/NIFEDICAL XL) 60 MG 24 hr tablet Take 60 mg by mouth daily.   pantoprazole (PROTONIX) 40 MG tablet Take 1 tablet (40 mg total) by mouth daily.   promethazine (PHENERGAN) 25 MG tablet Take 25 mg by mouth every 8 (eight) hours as needed for nausea or vomiting.   sertraline (ZOLOFT) 50 MG tablet Take 50 mg by mouth daily.   diclofenac Sodium (VOLTAREN) 1 % GEL Apply 1 application topically daily as needed for mild pain. (Patient not taking: No sig reported)   NOVOLOG FLEXPEN 100 UNIT/ML FlexPen Inject into the skin See admin instructions. PER  SLIDING SCALE IF 151-200=2 U; 201-250=4 U; 251-300=6 U; 301-350=8 U; 351-400=10 U. IF LESS THAN 70 OR OVER 350, NOTIFY DOCTOR (Patient not taking: No sig reported)   Vitamin D, Ergocalciferol, (DRISDOL) 1.25 MG (50000 UT) CAPS capsule Take 50,000 Units by mouth every Friday. (Patient not taking: No sig reported)     Critical care time: 39 minutes  Chesley Mires, MD [place] Pager - [phone] 09/19/2020, 1:26 PM

## 2020-09-20 DIAGNOSIS — E119 Type 2 diabetes mellitus without complications: Secondary | ICD-10-CM | POA: Diagnosis not present

## 2020-09-20 DIAGNOSIS — E872 Acidosis: Secondary | ICD-10-CM

## 2020-09-20 DIAGNOSIS — E162 Hypoglycemia, unspecified: Secondary | ICD-10-CM | POA: Diagnosis not present

## 2020-09-20 DIAGNOSIS — N186 End stage renal disease: Secondary | ICD-10-CM | POA: Diagnosis not present

## 2020-09-20 DIAGNOSIS — R400 Somnolence: Secondary | ICD-10-CM | POA: Diagnosis not present

## 2020-09-20 LAB — CBC
HCT: 30.8 % — ABNORMAL LOW (ref 36.0–46.0)
Hemoglobin: 10.1 g/dL — ABNORMAL LOW (ref 12.0–15.0)
MCH: 35.1 pg — ABNORMAL HIGH (ref 26.0–34.0)
MCHC: 32.8 g/dL (ref 30.0–36.0)
MCV: 106.9 fL — ABNORMAL HIGH (ref 80.0–100.0)
Platelets: 121 10*3/uL — ABNORMAL LOW (ref 150–400)
RBC: 2.88 MIL/uL — ABNORMAL LOW (ref 3.87–5.11)
RDW: 14.4 % (ref 11.5–15.5)
WBC: 6.2 10*3/uL (ref 4.0–10.5)
nRBC: 0.5 % — ABNORMAL HIGH (ref 0.0–0.2)

## 2020-09-20 LAB — RENAL FUNCTION PANEL
Albumin: 3.6 g/dL (ref 3.5–5.0)
Anion gap: 13 (ref 5–15)
BUN: 12 mg/dL (ref 8–23)
CO2: 26 mmol/L (ref 22–32)
Calcium: 8.4 mg/dL — ABNORMAL LOW (ref 8.9–10.3)
Chloride: 89 mmol/L — ABNORMAL LOW (ref 98–111)
Creatinine, Ser: 2.73 mg/dL — ABNORMAL HIGH (ref 0.44–1.00)
GFR, Estimated: 19 mL/min — ABNORMAL LOW (ref >60)
Glucose, Bld: 37 mg/dL — CL (ref 70–99)
Phosphorus: 1.4 mg/dL — ABNORMAL LOW (ref 2.5–4.6)
Potassium: 2.9 mmol/L — ABNORMAL LOW (ref 3.5–5.1)
Sodium: 128 mmol/L — ABNORMAL LOW (ref 135–145)

## 2020-09-20 LAB — MAGNESIUM: Magnesium: 1.9 mg/dL (ref 1.7–2.4)

## 2020-09-20 LAB — GLUCOSE, CAPILLARY
Glucose-Capillary: 111 mg/dL — ABNORMAL HIGH (ref 70–99)
Glucose-Capillary: 158 mg/dL — ABNORMAL HIGH (ref 70–99)
Glucose-Capillary: 207 mg/dL — ABNORMAL HIGH (ref 70–99)
Glucose-Capillary: 212 mg/dL — ABNORMAL HIGH (ref 70–99)
Glucose-Capillary: 51 mg/dL — ABNORMAL LOW (ref 70–99)
Glucose-Capillary: 71 mg/dL (ref 70–99)
Glucose-Capillary: 92 mg/dL (ref 70–99)
Glucose-Capillary: 95 mg/dL (ref 70–99)
Glucose-Capillary: 99 mg/dL (ref 70–99)

## 2020-09-20 LAB — BETA-HYDROXYBUTYRIC ACID: Beta-Hydroxybutyric Acid: 0.35 mmol/L — ABNORMAL HIGH (ref 0.05–0.27)

## 2020-09-20 LAB — HEPATITIS B SURFACE ANTIGEN: Hepatitis B Surface Ag: NONREACTIVE

## 2020-09-20 LAB — CORTISOL: Cortisol, Plasma: 22 ug/dL

## 2020-09-20 MED ORDER — POTASSIUM PHOSPHATES 15 MMOLE/5ML IV SOLN
10.0000 mmol | Freq: Once | INTRAVENOUS | Status: AC
  Administered 2020-09-20: 10 mmol via INTRAVENOUS
  Filled 2020-09-20: qty 3.33

## 2020-09-20 MED ORDER — CHLORHEXIDINE GLUCONATE CLOTH 2 % EX PADS
6.0000 | MEDICATED_PAD | Freq: Every day | CUTANEOUS | Status: DC
  Administered 2020-09-20 – 2020-09-23 (×4): 6 via TOPICAL

## 2020-09-20 MED ORDER — HYDRALAZINE HCL 25 MG PO TABS
25.0000 mg | ORAL_TABLET | Freq: Three times a day (TID) | ORAL | Status: DC
  Administered 2020-09-20 – 2020-09-21 (×4): 25 mg via ORAL
  Filled 2020-09-20 (×4): qty 1

## 2020-09-20 MED ORDER — CARVEDILOL 3.125 MG PO TABS
6.2500 mg | ORAL_TABLET | Freq: Two times a day (BID) | ORAL | Status: DC
  Administered 2020-09-20 – 2020-09-22 (×4): 6.25 mg via ORAL
  Filled 2020-09-20 (×6): qty 2

## 2020-09-20 NOTE — Progress Notes (Signed)
PROGRESS NOTE   TERELL LUBRANO  [id number] DOB: [birth year] DOA: 09/17/2020 PCP: [name], NP   Chief Complaint  Patient presents with   Altered Mental Status   Level of care: Stepdown  Brief Admission History:  65 y.o. female with medical history significant of alcohol induced pancreatitis, alcohol abuse, history of CAD/NSTEMI, depression, type 2 diabetes, ESRD on dialysis (TTS) diastolic dysfunction, history of DKA, gastroparesis, GERD, heart murmur, nephrolithiasis, hyperlipidemia, hypertension, hypokalemia, muscle spasms, neuropathic pain and neuropathy, history of seizures who is coming to the emergency department via EMS due to altered mental status in the setting of hypoglycemia of 39 mg/dL.  She responded to dextrose.  Her mental status has improved.  She is now oriented to name, place, situation and oriented to time and date, except that she thought today was Wednesday.  She also had 2 episodes of emesis.  She denied headache, chest pain, dyspnea, abdominal or back pain at this time.  No orthopnea or PND.   She has had chills, but denied fever.  No rhinorrhea, sore throat, wheezing or hemoptysis. She occasionally gets lower extremity edema.  Denied diarrhea, melena or hematochezia.  She occasionally gets constipated.  She does not urinate much regularly.  No dysuria, frequency or hematuria.  Assessment & Plan:   Principal Problem:   Hypoglycemia Active Problems:   Nausea & vomiting   Metabolic acidosis   Tobacco abuse   Lactic acidosis   Essential hypertension   Diabetes mellitus type 2 in nonobese (HCC)   ESRD (end stage renal disease) (HCC)   Hyponatremia   Hyperkalemia   Elevated MCV   Altered mental status   CAD (coronary artery disease)  Severe lactic acidosis - severe lactic acid elevation likely secondary to metformin.  Not sure how she was started on this.  She was not discharged on this when she was here last month.  Likely was started at the nursing  facility.  With her chronic kidney disease she has developed a severe lactic acidosis secondary to metformin use.  This has been discontinued.  She is being treated supportively with IV fluid hydration and plan to follow lactic acid levels.  Severe metabolic acidosis/Starvation ketosis - Pt seems to have starvation ketosis from not enough insulin.  She is only 44 Kg.  She has been started on low dose IV insulin infusion with dextrose infusion to help clear ketones.  Fortunately her ketones have improved and we have discontinued IV insulin infusion.  Continue lower rate dextrose infusion for now.    This patient is critically ill and mortality risk is very high.  I have asked for a palliative medicine consultation for goals of care.    Hypotension and hypothermia - central line placement completed. Hypothermia has resolved.  Critical care MD consultation requested.  Full sepsis work up ordered.   Hypoglycemia-Follow up on cortisol levels and likely will need to start solu-cortef.  Patient has no glycemic reserves due to her severely emaciated state.   Continue dextrose infusion and frequent CBG monitoring.  IV insulin stopped on 09/20/20 after ketones cleared.  Continue to follow.  Type 1 diabetes mellitus-patient presented with very high ketone levels.  Ketones cleared with IV dextrose and IV insulin administration.  Continue dextrose infusion.    End-stage renal disease-appreciate nephrology consultation and they are planning for hemodialysis therapy on 09/19/2020 and 09/21/20.  BPs have improved and will not require CRRT.     Hyperkalemia-secondary to metabolic acidosis this has been treated and  resolved we will continue to monitor closely until acidosis has resolved.  Nausea and vomiting-this is resolved.  Essential hypertension-BP has now rebounded and we have restarted carvedilol and hydralazine for BP control.  Tobacco-counseled on complete cessation   DVT prophylaxis: Enoxaparin Code Status:  Full Family Communication: son updated 09/18/20, 09/20/20 telephone Disposition: anticipate return to SNF when medically stabilized Status is: Inpatient  Remains inpatient appropriate because:IV treatments appropriate due to intensity of illness or inability to take PO and Inpatient level of care appropriate due to severity of illness  Dispo: The patient is from: SNF              Anticipated d/c is to: SNF              Patient currently is not medically stable to d/c.   Difficult to place patient No   Consultants:  Nephrology   Procedures:  Central line placement 09/19/20   Antimicrobials:  N/a    Subjective: Pt awake, alert and eating small amount of breakfast.      Objective: Vitals:   09/20/20 0600 09/20/20 0700 09/20/20 1000 09/20/20 1300  BP:  (!) 149/65 (!) 173/75 (!) 198/87  Pulse:  84 80   Resp: '16 17  16  '$ Temp:      TempSrc:      SpO2:  96% 100%   Weight:      Height:        Intake/Output Summary (Last 24 hours) at 09/20/2020 1334 Last data filed at 09/20/2020 0530 Gross per 24 hour  Intake 2271.81 ml  Output 11 ml  Net 2260.81 ml   Filed Weights   09/17/20 1441 09/19/20 0203 09/19/20 2215  Weight: 48 kg 43.6 kg 43.8 kg   Examination:  General exam: chronically ill appearing female, lying in bed arousable, oriented to person only, Appears calm and comfortable.  Pt appears emaciated and grossly underweight.  Respiratory system: Clear to auscultation. Respiratory effort normal. Cardiovascular system: normal S1 & S2 heard. No JVD, murmurs, rubs, gallops or clicks. No pedal edema. Gastrointestinal system: Abdomen is nondistended, soft and nontender. No organomegaly or masses felt. Normal bowel sounds heard. Central nervous system: Alert and oriented. No focal neurological deficits. Extremities: Symmetric 5 x 5 power. Skin: No rashes, lesions or ulcers Psychiatry: Judgement and insight appear poor. Mood & affect flat.    Data Reviewed: I have personally reviewed  following labs and imaging studies  CBC: Recent Labs  Lab 09/17/20 1505 09/18/20 0411 09/18/20 2205 09/19/20 1810 09/20/20 0407  WBC 9.2 7.7 9.0 6.9 6.2  NEUTROABS 5.9  --   --   --   --   HGB 13.1 10.3* 11.2* 10.1* 10.1*  HCT 44.0 33.7* 36.4 31.8* 30.8*  MCV 119.9* 116.2* 114.5* 110.0* 106.9*  PLT 266 211 187 141* 121*    Basic Metabolic Panel: Recent Labs  Lab 09/18/20 0411 09/18/20 1840 09/18/20 2205 09/19/20 1810 09/20/20 0407  NA 134* 129* 129* 124* 128*  K 4.5 5.0 4.3 3.6 2.9*  CL 93* 93* 91* 89* 89*  CO2 15* 11* 16* 18* 26  GLUCOSE 127* 141* 120* 144* 37*  BUN 31* 33* 32* 34* 12  CREATININE 5.34* 5.91* 5.94* 6.44* 2.73*  CALCIUM 9.4 8.9 9.3 8.4* 8.4*  MG  --   --   --  1.7 1.9  PHOS  --   --  3.6 2.7 1.4*    GFR: Estimated Creatinine Clearance: 14.2 mL/min (A) (by C-G formula based on SCr of  2.73 mg/dL (H)).  Liver Function Tests: Recent Labs  Lab 09/17/20 1505 09/18/20 0411 09/18/20 2205 09/19/20 1810 09/20/20 0407  AST 39 24  --   --   --   ALT 21 16  --   --   --   ALKPHOS 189* 147*  --   --   --   BILITOT 1.2 1.1  --   --   --   PROT 8.2* 6.5  --   --   --   ALBUMIN 4.2 3.5 3.6 3.3* 3.6    CBG: Recent Labs  Lab 09/20/20 0443 09/20/20 0528 09/20/20 0625 09/20/20 0745 09/20/20 0952  GLUCAP 51* 95 111* 99 92    Recent Results (from the past 240 hour(s))  Resp Panel by RT-PCR (Flu A&B, Covid) Nasopharyngeal Swab     Status: None   Collection Time: 09/17/20  6:01 PM   Specimen: Nasopharyngeal Swab; Nasopharyngeal(NP) swabs in vial transport medium  Result Value Ref Range Status   SARS Coronavirus 2 by RT PCR NEGATIVE NEGATIVE Final    Comment: (NOTE) SARS-CoV-2 target nucleic acids are NOT DETECTED.  The SARS-CoV-2 RNA is generally detectable in upper respiratory specimens during the acute phase of infection. The lowest concentration of SARS-CoV-2 viral copies this assay can detect is 138 copies/mL. A negative result does not  preclude SARS-Cov-2 infection and should not be used as the sole basis for treatment or other patient management decisions. A negative result may occur with  improper specimen collection/handling, submission of specimen other than nasopharyngeal swab, presence of viral mutation(s) within the areas targeted by this assay, and inadequate number of viral copies(<138 copies/mL). A negative result must be combined with clinical observations, patient history, and epidemiological information. The expected result is Negative.  Fact Sheet for Patients:  EntrepreneurPulse.com.au  Fact Sheet for Healthcare Providers:  IncredibleEmployment.be  This test is no t yet approved or cleared by the [place] FDA and  has been authorized for detection and/or diagnosis of SARS-CoV-2 by FDA under an Emergency Use Authorization (EUA). This EUA will remain  in effect (meaning this test can be used) for the duration of the COVID-19 declaration under Section 564(b)(1) of the Act, 21 U.S.C.section 360bbb-3(b)(1), unless the authorization is terminated  or revoked sooner.       Influenza A by PCR NEGATIVE NEGATIVE Final   Influenza B by PCR NEGATIVE NEGATIVE Final    Comment: (NOTE) The Xpert Xpress SARS-CoV-2/FLU/RSV plus assay is intended as an aid in the diagnosis of influenza from Nasopharyngeal swab specimens and should not be used as a sole basis for treatment. Nasal washings and aspirates are unacceptable for Xpert Xpress SARS-CoV-2/FLU/RSV testing.  Fact Sheet for Patients: EntrepreneurPulse.com.au  Fact Sheet for Healthcare Providers: IncredibleEmployment.be  This test is not yet approved or cleared by the [place] FDA and has been authorized for detection and/or diagnosis of SARS-CoV-2 by FDA under an Emergency Use Authorization (EUA). This EUA will remain in effect (meaning this test can be used) for the  duration of the COVID-19 declaration under Section 564(b)(1) of the Act, 21 U.S.C. section 360bbb-3(b)(1), unless the authorization is terminated or revoked.  Performed at [facility], [address], [place], [place] [address]   MRSA Next Gen by PCR, Nasal     Status: None   Collection Time: 09/18/20  8:48 PM   Specimen: Nasal Mucosa; Nasal Swab  Result Value Ref Range Status   MRSA by PCR Next Gen NOT DETECTED NOT DETECTED Final  Comment: (NOTE) The GeneXpert MRSA Assay (FDA approved for NASAL specimens only), is one component of a comprehensive MRSA colonization surveillance program. It is not intended to diagnose MRSA infection nor to guide or monitor treatment for MRSA infections. Test performance is not FDA approved in patients less than 40 years old. Performed at [facility], [address] [place], [place] [address]   C Difficile Quick Screen w PCR reflex     Status: None   Collection Time: 09/19/20  6:41 PM   Specimen: STOOL  Result Value Ref Range Status   C Diff antigen NEGATIVE NEGATIVE Final   C Diff toxin NEGATIVE NEGATIVE Final   C Diff interpretation No C. difficile detected.  Final    Comment: Performed at [facility], [address] [place], [place] [address]     Radiology Studies: [facility] Chest [facility] 1 View  Result Date: 09/19/2020 CLINICAL DATA:  Multiple central line attempts EXAM: PORTABLE CHEST 1 VIEW COMPARISON:  09/17/2020 FINDINGS: Mild cardiomegaly. No focal opacity or pleural effusion. No definitive pneumothorax. IMPRESSION: No active disease.  Mild cardiomegaly Electronically Signed   By: Donavan Foil M.D.   On: 09/19/2020 16:38    Scheduled Meds:  calcitRIOL  0.25 mcg Oral Q T,Th,Sa-HD   carvedilol  6.25 mg Oral BID WC   Chlorhexidine Gluconate Cloth  6 each Topical Q0600   cinacalcet  30 mg Oral QPM   enoxaparin (LOVENOX) injection  30 mg Subcutaneous Q24H   ferric citrate  420 mg Oral TID WC   folic acid  1 mg Oral q morning   hydrALAZINE  25  mg Oral Q8H   levETIRAcetam  1,000 mg Oral q morning   pantoprazole  40 mg Oral Daily   sertraline  50 mg Oral Daily   sodium chloride flush  10-40 mL Intracatheter Q12H   Continuous Infusions:  sodium chloride     sodium chloride     dextrose 65 mL/hr at 09/20/20 1313   potassium PHOSPHATE IVPB (in mmol) 10 mmol (09/20/20 1239)     LOS: 2 days   Critical Care Procedure Note Authorized and Performed by: Murvin Natal MD  Total Critical Care time:  43 mins  Due to a high probability of clinically significant, life threatening deterioration, the patient required my highest level of preparedness to intervene emergently and I personally spent this critical care time directly and personally managing the patient.  This critical care time included obtaining a history; examining the patient, pulse oximetry; ordering and review of studies; arranging urgent treatment with development of a management plan; evaluation of patient's response of treatment; frequent reassessment; and discussions with other providers.  This critical care time was performed to assess and manage the high probability of imminent and life threatening deterioration that could result in multi-organ failure.  It was exclusive of separately billable procedures and treating other patients and teaching time.    Irwin Brakeman, MD How to contact the [facility] Attending or Consulting provider [place] or covering provider during after hours [place], for this patient?  Check the care team in [facility] and look for a) attending/consulting [facility] provider listed and b) the [facility] team listed Log into [email or web address] and use [place]'s universal password to access. If you do not have the password, please contact the hospital operator. Locate the [facility] provider you are looking for under [facility] and page to a number that you can be directly reached. If you still have difficulty reaching the provider, please  page the [facility] (Director on Call) for the  Hospitalists listed on amion for assistance.  09/20/2020, 1:34 PM

## 2020-09-20 NOTE — Progress Notes (Signed)
Patient hypoglycemic CBG 51. Patient given two orange juices. Repeat CBG 95.

## 2020-09-20 NOTE — Progress Notes (Signed)
[facility] Progress Note    Assessment/ Plan:   ESRD:  -per previous admission outpatient orders are:EDW 42.5kg, Time: 3.75 hours, 2K/ 3 Ca, BFR 400, DFR 600,  Dialyzer: Gambro Revacir 200, LUE AVG. Heparin 1000 unit load, 500 units/hr (stop 60 min before end of HD) -will obtain outpatient records -next HD tomorrow, euvolemic on exam, will try for 1L UF tomorrow  Hypothermia -sepsis? -not on abx currently -warming blanket - per primary  Hypotension -improved, BP stable now   AMS, hypoglycemia, h/o DM1 -still having issues with hypoglycemia, per primary   Volume/ h/o hypertension: EDW 42.5kg.   Anemia of Chronic Kidney Disease: Hemoglobin 10.1-stable. monitor for now   Secondary Hyperparathyroidism/Hyperphosphatemia: c/w home meds and calcitriol   Anion Gap Metabolic acidosis, lactic acidosis -lactate persistently elevated--slightly better, bicarb improving -secondary to metformin. In the context of hypoglycemia and elevated lactate--would recommend ruling out a liver injury as well -also w/ possible starvation ketosis   Hyperkalemia -resolved, secondary to acidosis likely   Vascular access: LUE AVG   # Additional recommendations: - Dose all meds for creatinine clearance < 10 ml/min  - Unless absolutely necessary, no MRIs with gadolinium.  - Implement save arm precautions.  Prefer needle sticks in the dorsum of the hands or wrists.  No blood pressure measurements in arm. - If blood transfusion is requested during hemodialysis sessions, please alert [place] prior to the session. - If a hemodialysis catheter line culture is requested, please alert [place] as only hemodialysis nurses are able to collect those specimens.  Gean Quint, MD [facility]  Subjective:   No acute events, BP is better. Labile BP with HD overnight, kept net even/no UF   Objective:   BP (!) 173/75 (BP Location: Right Leg)   Pulse 80   Temp 97.8 F (36.6 C)   Resp 17    Ht 5' (1.524 m)   Wt 43.8 kg   SpO2 100%   BMI 18.86 kg/m   Intake/Output Summary (Last 24 hours) at 09/20/2020 1034 Last data filed at 09/20/2020 0530 Gross per 24 hour  Intake 2271.81 ml  Output 11 ml  Net 2260.81 ml   Weight change: 0.2 kg  Physical Exam: Gen:ill appearing, frail CVS:s1s2, rrr Resp:cta bl [id number] Ext:no edema, iv access rle Neuro: awake, moves all ext spontaneously HD access: lue avg +b/t  Imaging: DG Chest Port 1 View  Result Date: 09/19/2020 CLINICAL DATA:  Multiple central line attempts EXAM: PORTABLE CHEST 1 VIEW COMPARISON:  09/17/2020 FINDINGS: Mild cardiomegaly. No focal opacity or pleural effusion. No definitive pneumothorax. IMPRESSION: No active disease.  Mild cardiomegaly Electronically Signed   By: Donavan Foil M.D.   On: 09/19/2020 16:38    Labs: BMET Recent Labs  Lab 09/17/20 1958 09/17/20 2345 09/18/20 0411 09/18/20 1840 09/18/20 2205 09/19/20 1810 09/20/20 0407  NA 131* 132* 134* 129* 129* 124* 128*  K 4.9 4.7 4.5 5.0 4.3 3.6 2.9*  CL 95* 93* 93* 93* 91* 89* 89*  CO2 12* 14* 15* 11* 16* 18* 26  GLUCOSE 74 83 127* 141* 120* 144* 37*  BUN 29* 29* 31* 33* 32* 34* 12  CREATININE 5.09* 5.32* 5.34* 5.91* 5.94* 6.44* 2.73*  CALCIUM 9.5 9.3 9.4 8.9 9.3 8.4* 8.4*  PHOS  --   --   --   --  3.6 2.7 1.4*   CBC Recent Labs  Lab 09/17/20 1505 09/18/20 0411 09/18/20 2205 09/19/20 1810 09/20/20 0407  WBC 9.2 7.7 9.0 6.9 6.2  NEUTROABS 5.9  --   --   --   --   HGB 13.1 10.3* 11.2* 10.1* 10.1*  HCT 44.0 33.7* 36.4 31.8* 30.8*  MCV 119.9* 116.2* 114.5* 110.0* 106.9*  PLT 266 211 187 141* 121*    Medications:     calcitRIOL  0.25 mcg Oral Q T,Th,Sa-HD   Chlorhexidine Gluconate Cloth  6 each Topical Q0600   cinacalcet  30 mg Oral QPM   enoxaparin (LOVENOX) injection  30 mg Subcutaneous Q24H   ferric citrate  420 mg Oral TID WC   folic acid  1 mg Oral q morning   levETIRAcetam  1,000 mg Oral q morning   pantoprazole  40 mg Oral  Daily   sertraline  50 mg Oral Daily   sodium chloride flush  10-40 mL Intracatheter Q12H      Gean Quint, MD [facility] 09/20/2020, 10:34 AM

## 2020-09-20 NOTE — Consult Note (Signed)
[facility]   Patient name: Erica [name] Admit date: 09/17/2020  DOB: [birth year] LOS: 2  MRN: [id number] Consult date: 09/19/20  Referring provider: Dr. Wynetta Emery, [facility] CC: Altered mental status    History:  65 yo female brought to ER with altered mental status.  Apparently had refused outpt dialysis.  CBG by EMS 61 and then 39 in ER.  She was started on dextrose infusion.  She developed lactic acidosis, hypothermia and hypotension.  Difficulty with IV access.  Pt confused and not able to provide additional information.  Past medical history:  ETOH induced chronic pancreatitis, CAD, Depression, DM type 2, ESRD on HD, Chronic diastolic CHF, Gastroparesis, HLD, HTN, Neuropathy, Seizures  Significant events:  8/02 Admit 8/03 Nephrology consulted 8/04 unable to place CVL; PICC line placed but then lost function  Studies:    Micro:    Lines:     Antibiotics:    Consults:  Nephrology Palliative care    Interim history:  Off insulin gtt.  Temperature in normal range and blood pressure improved.  Vital signs:  BP (!) 198/87   Pulse 80   Temp 97.8 F (36.6 C)   Resp 16   Ht 5' (1.524 m)   Wt 43.8 kg   SpO2 100%   BMI 18.86 kg/m   Intake/output:  I/O last 3 completed shifts: In: 4773.9 [I.V.:4273.9; IV Piggyback:500] Out: 11 [Other:11]   Physical exam:   General - cachectic Eyes - pupils reactive ENT - no sinus tenderness, no stridor Cardiac - regular rate/rhythm, no murmur Chest - equal breath sounds b/l, no wheezing or rales Abdomen - soft, non tender, + bowel sounds Extremities - no cyanosis, clubbing, or edema Skin - no rashes Neuro - normal strength, moves extremities, follows commands    Best practice:   DVT - lovenox SUP - Protonix Nutrition - Renal/carb modified Mobility - bed rest   Assessment/plan:   Lactic acidosis in setting of hypoglycemia. - optimize hemodynamics and  maintain blood sugar levels  ESRD. - iHD per nephrology  Difficulty IV access. - has thrombosed vessels in multiple locations - my need IR or vascular surgery to assess if additional access needed  Diabetes poorly controlled with episodes of hypoglycemia. - per primary team  Hx of seizures. - continue keppra  Hx of depression. - continue zoloft  Severe protein calorie malnutrition in setting of chronic alcohol induced pancreatitis. - continue diet  Goals of care. - palliative care consulted; family wishes to continue aggressive therapies  PCCM will sign off.  Please call if additional help needed.  Resolved hospital problems:  Hypotension, Hypothermia, Altered mental status  Goals of care/Family discussions:  Code status: full  Labs:   CMP Latest Ref Rng & Units 09/20/2020 09/19/2020 09/18/2020  Glucose 70 - 99 mg/dL 37(LL) 144(H) 120(H)  BUN 8 - 23 mg/dL 12 34(H) 32(H)  Creatinine 0.44 - 1.00 mg/dL 2.73(H) 6.44(H) 5.94(H)  Sodium 135 - 145 mmol/L 128(L) 124(L) 129(L)  Potassium 3.5 - 5.1 mmol/L 2.9(L) 3.6 4.3  Chloride 98 - 111 mmol/L 89(L) 89(L) 91(L)  CO2 22 - 32 mmol/L 26 18(L) 16(L)  Calcium 8.9 - 10.3 mg/dL 8.4(L) 8.4(L) 9.3  Total Protein 6.5 - 8.1 g/dL - - -  Total Bilirubin 0.3 - 1.2 mg/dL - - -  Alkaline Phos 38 - 126 U/L - - -  AST 15 - 41 U/L - - -  ALT 0 - 44 U/L - - -  CBC Latest Ref Rng & Units 09/20/2020 09/19/2020 09/18/2020  WBC 4.0 - 10.5 K/uL 6.2 6.9 9.0  Hemoglobin 12.0 - 15.0 g/dL 10.1(L) 10.1(L) 11.2(L)  Hematocrit 36.0 - 46.0 % 30.8(L) 31.8(L) 36.4  Platelets 150 - 400 K/uL 121(L) 141(L) 187    ABG    Component Value Date/Time   PHART 7.272 (L) 11/09/2017 1030   PCO2ART 21.0 (L) 11/09/2017 1030   PO2ART 82.8 (L) 11/09/2017 1030   HCO3 10.3 (L) 09/17/2020 1958   TCO2 26 04/20/2019 0716   ACIDBASEDEF 17.9 (H) 09/17/2020 1958   O2SAT 73.4 09/17/2020 1958    CBG (last 3)  Recent Labs    09/20/20 0625 09/20/20 0745 09/20/20 0952   GLUCAP 111* 99 92    Signature:  Chesley Mires, MD [place] Pager - [phone] 09/20/2020, 2:26 PM

## 2020-09-20 NOTE — Progress Notes (Signed)
TRH night shift stepdown coverage note.  The nursing staff reported that the patient's blood glucose has been trending up. Her last 2 measurements have been over 200 mg/dL.  She still showed positive beta hydroxybutyric acid level this morning so we will continue the continuous low-dose insulin infusion to avoid recrudescence of ketosis, but will decrease dextrose 10% rate from 65 mL/h to 45 mL/h to target her blood glucose level between 120-180 mg/dL.  Tennis Must, MD

## 2020-09-20 NOTE — Procedures (Signed)
   HEMODIALYSIS TREATMENT NOTE:  Labile blood pressures but also challenging cuff placement / questionable readings.  Kept even / no UF--appears euvolemic.  Albumin 12.5 given once.  3 hour session completed.  All blood was returned and hemostasis was achieved in 10 minutes.   Rockwell Alexandria, RN

## 2020-09-21 DIAGNOSIS — E162 Hypoglycemia, unspecified: Secondary | ICD-10-CM | POA: Diagnosis not present

## 2020-09-21 DIAGNOSIS — E872 Acidosis: Secondary | ICD-10-CM | POA: Diagnosis not present

## 2020-09-21 LAB — RENAL FUNCTION PANEL
Albumin: 3.3 g/dL — ABNORMAL LOW (ref 3.5–5.0)
Anion gap: 11 (ref 5–15)
BUN: 13 mg/dL (ref 8–23)
CO2: 25 mmol/L (ref 22–32)
Calcium: 7.9 mg/dL — ABNORMAL LOW (ref 8.9–10.3)
Chloride: 85 mmol/L — ABNORMAL LOW (ref 98–111)
Creatinine, Ser: 3.51 mg/dL — ABNORMAL HIGH (ref 0.44–1.00)
GFR, Estimated: 14 mL/min — ABNORMAL LOW (ref >60)
Glucose, Bld: 160 mg/dL — ABNORMAL HIGH (ref 70–99)
Phosphorus: 2.2 mg/dL — ABNORMAL LOW (ref 2.5–4.6)
Potassium: 3.3 mmol/L — ABNORMAL LOW (ref 3.5–5.1)
Sodium: 121 mmol/L — ABNORMAL LOW (ref 135–145)

## 2020-09-21 LAB — CBC
HCT: 28.8 % — ABNORMAL LOW (ref 36.0–46.0)
Hemoglobin: 9.6 g/dL — ABNORMAL LOW (ref 12.0–15.0)
MCH: 35.4 pg — ABNORMAL HIGH (ref 26.0–34.0)
MCHC: 33.3 g/dL (ref 30.0–36.0)
MCV: 106.3 fL — ABNORMAL HIGH (ref 80.0–100.0)
Platelets: 95 10*3/uL — ABNORMAL LOW (ref 150–400)
RBC: 2.71 MIL/uL — ABNORMAL LOW (ref 3.87–5.11)
RDW: 14.2 % (ref 11.5–15.5)
WBC: 4.5 10*3/uL (ref 4.0–10.5)
nRBC: 0 % (ref 0.0–0.2)

## 2020-09-21 LAB — GLUCOSE, CAPILLARY
Glucose-Capillary: 158 mg/dL — ABNORMAL HIGH (ref 70–99)
Glucose-Capillary: 165 mg/dL — ABNORMAL HIGH (ref 70–99)
Glucose-Capillary: 169 mg/dL — ABNORMAL HIGH (ref 70–99)
Glucose-Capillary: 141 mg/dL — ABNORMAL HIGH (ref 70–99)

## 2020-09-21 LAB — MAGNESIUM: Magnesium: 1.4 mg/dL — ABNORMAL LOW (ref 1.7–2.4)

## 2020-09-21 LAB — CORTISOL-AM, BLOOD: Cortisol - AM: 16.3 ug/dL (ref 6.7–22.6)

## 2020-09-21 MED ORDER — INSULIN ASPART 100 UNIT/ML IJ SOLN
0.0000 [IU] | Freq: Three times a day (TID) | INTRAMUSCULAR | Status: DC
  Administered 2020-09-21: 1 [IU] via SUBCUTANEOUS

## 2020-09-21 MED ORDER — INSULIN GLARGINE-YFGN 100 UNIT/ML ~~LOC~~ SOLN
2.0000 [IU] | Freq: Every day | SUBCUTANEOUS | Status: DC
  Administered 2020-09-21 – 2020-09-23 (×3): 2 [IU] via SUBCUTANEOUS
  Filled 2020-09-21 (×5): qty 0.02

## 2020-09-21 MED ORDER — HYDRALAZINE HCL 25 MG PO TABS
50.0000 mg | ORAL_TABLET | Freq: Three times a day (TID) | ORAL | Status: DC
  Administered 2020-09-22 – 2020-09-24 (×8): 50 mg via ORAL
  Filled 2020-09-21 (×8): qty 2

## 2020-09-21 NOTE — Progress Notes (Signed)
Alerted to pt bedside by another RN.  Family at pt bedside and gave the pt snuff/smokeless tobacco orally.  RN stated the pt was unresponsive and the RN suctioned the pts mouth.  Pt became responsive and began coughing up snuff.  Oral care provided to pt and vital signs unchanged.  MD notified.

## 2020-09-21 NOTE — Progress Notes (Signed)
PROGRESS NOTE   Erica [name]  [id number] DOB: 1955-07-08 DOA: 09/17/2020 PCP: [name], NP   Chief Complaint  Patient presents with   Altered Mental Status   Level of care: Telemetry  Brief Admission History:  65 y.o. female with medical history significant of alcohol induced pancreatitis, alcohol abuse, history of CAD/NSTEMI, depression, type 2 diabetes, ESRD on dialysis (TTS) diastolic dysfunction, history of DKA, gastroparesis, GERD, heart murmur, nephrolithiasis, hyperlipidemia, hypertension, hypokalemia, muscle spasms, neuropathic pain and neuropathy, history of seizures who is coming to the emergency department via EMS due to altered mental status in the setting of hypoglycemia of 39 mg/dL.  She responded to dextrose.  Her mental status has improved.  She is now oriented to name, place, situation and oriented to time and date, except that she thought today was Wednesday.  She also had 2 episodes of emesis.  She denied headache, chest pain, dyspnea, abdominal or back pain at this time.  No orthopnea or PND.   She has had chills, but denied fever.  No rhinorrhea, sore throat, wheezing or hemoptysis. She occasionally gets lower extremity edema.  Denied diarrhea, melena or hematochezia.  She occasionally gets constipated.  She does not urinate much regularly.  No dysuria, frequency or hematuria.  Assessment & Plan:   Principal Problem:   Hypoglycemia Active Problems:   Nausea & vomiting   Metabolic acidosis   Tobacco abuse   Lactic acidosis   Essential hypertension   Diabetes mellitus type 2 in nonobese (HCC)   ESRD (end stage renal disease) (HCC)   Hyponatremia   Hyperkalemia   Elevated MCV   Altered mental status   CAD (coronary artery disease)  Severe lactic acidosis - severe lactic acid elevation likely secondary to metformin.  Not sure how she was started on this.  She was not discharged on this when she was here last month.  Likely was started at the nursing  facility.  With her chronic kidney disease she has developed a severe lactic acidosis secondary to metformin use.  This has been discontinued.  She is being treated supportively with IV fluid hydration and plan to follow lactic acid levels.  Severe metabolic acidosis/Starvation ketosis - Pt seems to have starvation ketosis from not enough insulin.  She is only 44 Kg.  She was started on low dose IV insulin infusion with dextrose infusion to help clear ketones.  Fortunately her ketones have improved and we have discontinued IV insulin infusion.  She is now on subcutaneous insulin.  Lantus 2 units daily plus SSI coverage as she is very insulin sensitive. Reduced and stop IV dextrose as she is eating well now.    Hypotension and hypothermia - central line placement completed. Hypothermia has resolved.  Critical care MD consultation requested.  Full sepsis work up ordered.   Hypoglycemia-Cortisol levels were good with both tests.  Patient has no glycemic reserves due to her severely emaciated state.  Pt is very sensitive to insulin and only very low doses would be given.  Ideally she should have an insulin pump but this would have to be arranged outpatient.  Type 1 diabetes mellitus-patient presented with very high ketone levels.  Ketones cleared with IV dextrose and IV insulin administration.  Ketosis has resolved.     End-stage renal disease-appreciate nephrology consultation and they are planning for hemodialysis therapy on 09/19/2020 and 09/21/20.  BPs have improved and will not require CRRT.     Hyperkalemia-secondary to metabolic acidosis this has been treated  and resolved we will continue to monitor closely until acidosis has resolved.  Nausea and vomiting-this is resolved.  Essential hypertension-BP has now rebounded and we have restarted carvedilol and hydralazine for BP control. Increased hydralazine to 50 mg TID.   Tobacco-counseled on complete cessation   DVT prophylaxis: Enoxaparin Code  Status: Full Family Communication: son updated 09/18/20, 09/20/20 telephone Disposition: anticipate return to SNF when medically stabilized Status is: Inpatient  Remains inpatient appropriate because:IV treatments appropriate due to intensity of illness or inability to take PO and Inpatient level of care appropriate due to severity of illness  Dispo: The patient is from: SNF              Anticipated d/c is to: SNF              Patient currently is not medically stable to d/c.   Difficult to place patient No   Consultants:  Nephrology   Procedures:  Central line placement 09/19/20   Antimicrobials:  N/a    Subjective: Pt eating and drinking much better today.     Objective: Vitals:   09/21/20 0510 09/21/20 0600 09/21/20 0900 09/21/20 1200  BP: (!) 179/79 (!) 182/62 (!) 161/63 (!) 178/83  Pulse:    81  Resp:  '15 13 11  '$ Temp:   98 F (36.7 C)   TempSrc:   Oral   SpO2:   100% 99%  Weight:      Height:        Intake/Output Summary (Last 24 hours) at 09/21/2020 1252 Last data filed at 09/21/2020 1201 Gross per 24 hour  Intake 2353.6 ml  Output --  Net 2353.6 ml   Filed Weights   09/17/20 1441 09/19/20 0203 09/19/20 2215  Weight: 48 kg 43.6 kg 43.8 kg   Examination:  General exam: chronically ill appearing female, lying in bed arousable, oriented x2, Appears calm and comfortable.  Pt appears emaciated and grossly underweight. Edentulous. Respiratory system: Clear to auscultation. Respiratory effort normal. Cardiovascular system: normal S1 & S2 heard. No JVD, murmurs, rubs, gallops or clicks. No pedal edema. Gastrointestinal system: Abdomen is nondistended, soft and nontender. No organomegaly or masses felt. Normal bowel sounds heard. Central nervous system: Alert and oriented. No focal neurological deficits. Extremities: Symmetric 5 x 5 power. Skin: No rashes, lesions or ulcers Psychiatry: Judgement and insight appear poor. Mood & affect flat.    Data Reviewed: I have  personally reviewed following labs and imaging studies  CBC: Recent Labs  Lab 09/17/20 1505 09/18/20 0411 09/18/20 2205 09/19/20 1810 09/20/20 0407 09/21/20 0442  WBC 9.2 7.7 9.0 6.9 6.2 4.5  NEUTROABS 5.9  --   --   --   --   --   HGB 13.1 10.3* 11.2* 10.1* 10.1* 9.6*  HCT 44.0 33.7* 36.4 31.8* 30.8* 28.8*  MCV 119.9* 116.2* 114.5* 110.0* 106.9* 106.3*  PLT 266 211 187 141* 121* 95*    Basic Metabolic Panel: Recent Labs  Lab 09/18/20 1840 09/18/20 2205 09/19/20 1810 09/20/20 0407 09/21/20 0442  NA 129* 129* 124* 128* 121*  K 5.0 4.3 3.6 2.9* 3.3*  CL 93* 91* 89* 89* 85*  CO2 11* 16* 18* 26 25  GLUCOSE 141* 120* 144* 37* 160*  BUN 33* 32* 34* 12 13  CREATININE 5.91* 5.94* 6.44* 2.73* 3.51*  CALCIUM 8.9 9.3 8.4* 8.4* 7.9*  MG  --   --  1.7 1.9 1.4*  PHOS  --  3.6 2.7 1.4* 2.2*    GFR: Estimated  Creatinine Clearance: 11 mL/min (A) (by C-G formula based on SCr of 3.51 mg/dL (H)).  Liver Function Tests: Recent Labs  Lab 09/17/20 1505 09/18/20 0411 09/18/20 2205 09/19/20 1810 09/20/20 0407 09/21/20 0442  AST 39 24  --   --   --   --   ALT 21 16  --   --   --   --   ALKPHOS 189* 147*  --   --   --   --   BILITOT 1.2 1.1  --   --   --   --   PROT 8.2* 6.5  --   --   --   --   ALBUMIN 4.2 3.5 3.6 3.3* 3.6 3.3*    CBG: Recent Labs  Lab 09/20/20 1705 09/20/20 2104 09/21/20 0229 09/21/20 0752 09/21/20 1137  GLUCAP 207* 212* 158* 165* 169*    Recent Results (from the past 240 hour(s))  Resp Panel by RT-PCR (Flu A&B, Covid) Nasopharyngeal Swab     Status: None   Collection Time: 09/17/20  6:01 PM   Specimen: Nasopharyngeal Swab; Nasopharyngeal(NP) swabs in vial transport medium  Result Value Ref Range Status   SARS Coronavirus 2 by RT PCR NEGATIVE NEGATIVE Final    Comment: (NOTE) SARS-CoV-2 target nucleic acids are NOT DETECTED.  The SARS-CoV-2 RNA is generally detectable in upper respiratory specimens during the acute phase of infection. The  lowest concentration of SARS-CoV-2 viral copies this assay can detect is 138 copies/mL. A negative result does not preclude SARS-Cov-2 infection and should not be used as the sole basis for treatment or other patient management decisions. A negative result may occur with  improper specimen collection/handling, submission of specimen other than nasopharyngeal swab, presence of viral mutation(s) within the areas targeted by this assay, and inadequate number of viral copies(<138 copies/mL). A negative result must be combined with clinical observations, patient history, and epidemiological information. The expected result is Negative.  Fact Sheet for Patients:  EntrepreneurPulse.com.au  Fact Sheet for Healthcare Providers:  IncredibleEmployment.be  This test is no t yet approved or cleared by the [place] FDA and  has been authorized for detection and/or diagnosis of SARS-CoV-2 by FDA under an Emergency Use Authorization (EUA). This EUA will remain  in effect (meaning this test can be used) for the duration of the COVID-19 declaration under Section 564(b)(1) of the Act, 21 U.S.C.section 360bbb-3(b)(1), unless the authorization is terminated  or revoked sooner.       Influenza A by PCR NEGATIVE NEGATIVE Final   Influenza B by PCR NEGATIVE NEGATIVE Final    Comment: (NOTE) The Xpert Xpress SARS-CoV-2/FLU/RSV plus assay is intended as an aid in the diagnosis of influenza from Nasopharyngeal swab specimens and should not be used as a sole basis for treatment. Nasal washings and aspirates are unacceptable for Xpert Xpress SARS-CoV-2/FLU/RSV testing.  Fact Sheet for Patients: EntrepreneurPulse.com.au  Fact Sheet for Healthcare Providers: IncredibleEmployment.be  This test is not yet approved or cleared by the [place] FDA and has been authorized for detection and/or diagnosis of SARS-CoV-2 by FDA under  an Emergency Use Authorization (EUA). This EUA will remain in effect (meaning this test can be used) for the duration of the COVID-19 declaration under Section 564(b)(1) of the Act, 21 U.S.C. section 360bbb-3(b)(1), unless the authorization is terminated or revoked.  Performed at [facility], [address], [place], [place] [address]   MRSA Next Gen by PCR, Nasal     Status: None   Collection Time:  09/18/20  8:48 PM   Specimen: Nasal Mucosa; Nasal Swab  Result Value Ref Range Status   MRSA by PCR Next Gen NOT DETECTED NOT DETECTED Final    Comment: (NOTE) The GeneXpert MRSA Assay (FDA approved for NASAL specimens only), is one component of a comprehensive MRSA colonization surveillance program. It is not intended to diagnose MRSA infection nor to guide or monitor treatment for MRSA infections. Test performance is not FDA approved in patients less than 73 years old. Performed at [facility], [address] [place], [place] [address]   C Difficile Quick Screen w PCR reflex     Status: None   Collection Time: 09/19/20  6:41 PM   Specimen: STOOL  Result Value Ref Range Status   C Diff antigen NEGATIVE NEGATIVE Final   C Diff toxin NEGATIVE NEGATIVE Final   C Diff interpretation No C. difficile detected.  Final    Comment: Performed at [facility] [place], [address], [place], [place] [address]     Radiology Studies: No results found.  Scheduled Meds:  calcitRIOL  0.25 mcg Oral Q T,Th,Sa-HD   carvedilol  6.25 mg Oral BID WC   Chlorhexidine Gluconate Cloth  6 each Topical Q0600   cinacalcet  30 mg Oral QPM   enoxaparin (LOVENOX) injection  30 mg Subcutaneous Q24H   ferric citrate  420 mg Oral TID WC   folic acid  1 mg Oral q morning   hydrALAZINE  50 mg Oral Q8H   insulin aspart  0-6 Units Subcutaneous TID WC   insulin glargine-yfgn  2 Units Subcutaneous Daily   levETIRAcetam  1,000 mg Oral q morning   pantoprazole  40 mg Oral Daily   sertraline  50 mg Oral Daily   sodium  chloride flush  10-40 mL Intracatheter Q12H   Continuous Infusions:  sodium chloride     sodium chloride     dextrose 45 mL/hr at 09/21/20 1201    LOS: 3 days   Total time: 36 mins   Hung Rhinesmith, MD How to contact the [facility] Attending or Consulting provider [place] or covering provider during after hours [place], for this patient?  Check the care team in [facility] and look for a) attending/consulting [facility] provider listed and b) the [facility] team listed Log into [email or web address] and use [place]'s universal password to access. If you do not have the password, please contact the hospital operator. Locate the [facility] provider you are looking for under [facility] and page to a number that you can be directly reached. If you still have difficulty reaching the provider, please page the [facility] (Director on Call) for the Hospitalists listed on amion for assistance.  09/21/2020, 12:52 PM

## 2020-09-21 NOTE — Procedures (Signed)
   HEMODIALYSIS TREATMENT NOTE:  3.5 hour heparin-free HD completed.  Goal met: 1.7 liters removed.  Pt lethargic - slept through most of HD session. All blood was returned.  Prolonged bleeding from venous limb of loop AVG; hemostasis was achieved in 30 minutes.  Rockwell Alexandria, RN

## 2020-09-22 ENCOUNTER — Inpatient Hospital Stay (HOSPITAL_COMMUNITY): Payer: Medicare Other

## 2020-09-22 DIAGNOSIS — E162 Hypoglycemia, unspecified: Secondary | ICD-10-CM | POA: Diagnosis not present

## 2020-09-22 DIAGNOSIS — E872 Acidosis: Secondary | ICD-10-CM | POA: Diagnosis not present

## 2020-09-22 LAB — RENAL FUNCTION PANEL
Albumin: 3.1 g/dL — ABNORMAL LOW (ref 3.5–5.0)
Anion gap: 10 (ref 5–15)
BUN: 9 mg/dL (ref 8–23)
CO2: 24 mmol/L (ref 22–32)
Calcium: 7.8 mg/dL — ABNORMAL LOW (ref 8.9–10.3)
Chloride: 89 mmol/L — ABNORMAL LOW (ref 98–111)
Creatinine, Ser: 2.78 mg/dL — ABNORMAL HIGH (ref 0.44–1.00)
GFR, Estimated: 18 mL/min — ABNORMAL LOW (ref >60)
Glucose, Bld: 131 mg/dL — ABNORMAL HIGH (ref 70–99)
Phosphorus: 1.5 mg/dL — ABNORMAL LOW (ref 2.5–4.6)
Potassium: 3.1 mmol/L — ABNORMAL LOW (ref 3.5–5.1)
Sodium: 123 mmol/L — ABNORMAL LOW (ref 135–145)

## 2020-09-22 LAB — GLUCOSE, CAPILLARY
Glucose-Capillary: 145 mg/dL — ABNORMAL HIGH (ref 70–99)
Glucose-Capillary: 134 mg/dL — ABNORMAL HIGH (ref 70–99)
Glucose-Capillary: 134 mg/dL — ABNORMAL HIGH (ref 70–99)
Glucose-Capillary: 127 mg/dL — ABNORMAL HIGH (ref 70–99)
Glucose-Capillary: 141 mg/dL — ABNORMAL HIGH (ref 70–99)

## 2020-09-22 LAB — MAGNESIUM: Magnesium: 1.8 mg/dL (ref 1.7–2.4)

## 2020-09-22 MED ORDER — MAGNESIUM SULFATE 2 GM/50ML IV SOLN
2.0000 g | Freq: Once | INTRAVENOUS | Status: AC
  Administered 2020-09-22: 2 g via INTRAVENOUS
  Filled 2020-09-22: qty 50

## 2020-09-22 NOTE — Progress Notes (Signed)
Patient had nose bleed, minimal amount of bleeding. MD Wynetta Emery aware.

## 2020-09-22 NOTE — Progress Notes (Signed)
Blood sugar 145

## 2020-09-22 NOTE — Progress Notes (Signed)
PROGRESS NOTE   Erica [name]  [id number] DOB: 1955-09-22 DOA: 09/17/2020 PCP: [name], NP   Chief Complaint  Patient presents with   Altered Mental Status   Level of care: Telemetry  Brief Admission History:  65 y.o. female with medical history significant of alcohol induced pancreatitis, alcohol abuse, history of CAD/NSTEMI, depression, type 2 diabetes, ESRD on dialysis (TTS) diastolic dysfunction, history of DKA, gastroparesis, GERD, heart murmur, nephrolithiasis, hyperlipidemia, hypertension, hypokalemia, muscle spasms, neuropathic pain and neuropathy, history of seizures who is coming to the emergency department via EMS due to altered mental status in the setting of hypoglycemia of 39 mg/dL.  She responded to dextrose.  Her mental status has improved.  She is now oriented to name, place, situation and oriented to time and date, except that she thought today was Wednesday.  She also had 2 episodes of emesis.  She denied headache, chest pain, dyspnea, abdominal or back pain at this time.  No orthopnea or PND.   She has had chills, but denied fever.  No rhinorrhea, sore throat, wheezing or hemoptysis. She occasionally gets lower extremity edema.  Denied diarrhea, melena or hematochezia.  She occasionally gets constipated.  She does not urinate much regularly.  No dysuria, frequency or hematuria.  Assessment & Plan:   Principal Problem:   Hypoglycemia Active Problems:   Nausea & vomiting   Metabolic acidosis   Tobacco abuse   Lactic acidosis   Essential hypertension   Diabetes mellitus type 2 in nonobese (HCC)   ESRD (end stage renal disease) (HCC)   Hyponatremia   Hyperkalemia   Elevated MCV   Altered mental status   CAD (coronary artery disease)  Severe lactic acidosis - severe lactic acid elevation likely secondary to metformin.  Not sure how she was started on this.  She was not discharged on this when she was here last month.  Likely was started at the nursing  facility.  With her chronic kidney disease she has developed a severe lactic acidosis secondary to metformin use.  This has been discontinued.  She is being treated supportively with IV fluid hydration and plan to follow lactic acid levels.  Severe metabolic acidosis/Starvation ketosis - Pt seems to have starvation ketosis from not enough insulin.  She is only 44 Kg.  She was started on low dose IV insulin infusion with dextrose infusion to help clear ketones.  Fortunately her ketones have improved and we have discontinued IV insulin infusion.  She is now on subcutaneous insulin.  Lantus 2 units daily plus SSI coverage as she is very insulin sensitive.  Stop IV dextrose as she is eating well now.    Hypotension and hypothermia - central line placement completed. Hypothermia has resolved.  Critical care MD consultation requested.  Sepsis Ruled Out.     Hypoglycemia-Cortisol levels were good with both tests.  Patient has no glycemic reserves due to her severely emaciated state.  Pt is very sensitive to insulin and only very low doses would be given.  Ideally she should have an insulin pump but this would have to be arranged outpatient.  Type 1 diabetes mellitus-patient presented with very high ketone levels.  Ketones cleared with IV dextrose and IV insulin administration.  Ketosis has resolved.     End-stage renal disease-appreciate nephrology consultation and they are planning for hemodialysis therapy on 09/19/2020 and 09/21/20.  BPs have improved and will not require CRRT.     Hyperkalemia-secondary to metabolic acidosis this has been treated and  resolved we will continue to monitor closely until acidosis has resolved.  Nausea and vomiting-this is resolved.  Essential hypertension-BP has now rebounded and we have restarted carvedilol and hydralazine for BP control. Increased hydralazine to 50 mg TID.   Tobacco-counseled on complete cessation.  Unfortunately a family member brought the patient chewing  tobacco on 09/21/2020 and she nearly aspirated on it.  I have made strict instructions to not allow family members to bring tobacco to patient while in the hospital.  Abdominal discomfort - Pt c/o of abdominal fullness.  Checking portable abdominal xray.    DVT prophylaxis: Enoxaparin Code Status: Full Family Communication: son updated 09/18/20, 09/20/20 telephone Disposition: anticipate return to SNF when medically stabilized Status is: Inpatient  Remains inpatient appropriate because:IV treatments appropriate due to intensity of illness or inability to take PO and Inpatient level of care appropriate due to severity of illness  Dispo: The patient is from: SNF              Anticipated d/c is to: SNF              Patient currently is not medically stable to d/c.   Difficult to place patient No   Consultants:  Nephrology   Procedures:  Central line placement 09/19/20   Antimicrobials:  N/a    Subjective: Pt without specific complaints.     Objective: Vitals:   09/22/20 0106 09/22/20 0547 09/22/20 0548 09/22/20 0939  BP: (!) 157/70  (!) 147/82 (!) 143/76  Pulse: 74 65  83  Resp: 20 18    Temp: 98.7 F (37.1 C) 99.1 F (37.3 C)    TempSrc:  Oral    SpO2: 100% 95%    Weight:      Height:        Intake/Output Summary (Last 24 hours) at 09/22/2020 1242 Last data filed at 09/22/2020 0947 Gross per 24 hour  Intake 892.36 ml  Output 1855 ml  Net -962.64 ml   Filed Weights   09/19/20 0203 09/19/20 2215 09/21/20 1600  Weight: 43.6 kg 43.8 kg 49.9 kg   Examination:  General exam: chronically ill appearing female, lying in bed arousable, oriented x2, Appears calm and comfortable.  Pt appears emaciated and grossly underweight. Edentulous. Respiratory system: Clear to auscultation. Respiratory effort normal. Cardiovascular system: normal S1 & S2 heard. No JVD, murmurs, rubs, gallops or clicks. No pedal edema. Gastrointestinal system: Abdomen is nondistended, soft and nontender. No  organomegaly or masses felt. Normal bowel sounds heard. Central nervous system: Alert and oriented. No focal neurological deficits. Extremities: Symmetric 5 x 5 power. Skin: No rashes, lesions or ulcers Psychiatry: Judgement and insight appear poor. Mood & affect flat.    Data Reviewed: I have personally reviewed following labs and imaging studies  CBC: Recent Labs  Lab 09/17/20 1505 09/18/20 0411 09/18/20 2205 09/19/20 1810 09/20/20 0407 09/21/20 0442  WBC 9.2 7.7 9.0 6.9 6.2 4.5  NEUTROABS 5.9  --   --   --   --   --   HGB 13.1 10.3* 11.2* 10.1* 10.1* 9.6*  HCT 44.0 33.7* 36.4 31.8* 30.8* 28.8*  MCV 119.9* 116.2* 114.5* 110.0* 106.9* 106.3*  PLT 266 211 187 141* 121* 95*    Basic Metabolic Panel: Recent Labs  Lab 09/18/20 2205 09/19/20 1810 09/20/20 0407 09/21/20 0442 09/22/20 0610  NA 129* 124* 128* 121* 123*  K 4.3 3.6 2.9* 3.3* 3.1*  CL 91* 89* 89* 85* 89*  CO2 16* 18* 26 25 24  GLUCOSE 120* 144* 37* 160* 131*  BUN 32* 34* '12 13 9  '$ CREATININE 5.94* 6.44* 2.73* 3.51* 2.78*  CALCIUM 9.3 8.4* 8.4* 7.9* 7.8*  MG  --  1.7 1.9 1.4* 1.8  PHOS 3.6 2.7 1.4* 2.2* 1.5*    GFR: Estimated Creatinine Clearance: 14.5 mL/min (A) (by C-G formula based on SCr of 2.78 mg/dL (H)).  Liver Function Tests: Recent Labs  Lab 09/17/20 1505 09/18/20 0411 09/18/20 2205 09/19/20 1810 09/20/20 0407 09/21/20 0442 09/22/20 0610  AST 39 24  --   --   --   --   --   ALT 21 16  --   --   --   --   --   ALKPHOS 189* 147*  --   --   --   --   --   BILITOT 1.2 1.1  --   --   --   --   --   PROT 8.2* 6.5  --   --   --   --   --   ALBUMIN 4.2 3.5 3.6 3.3* 3.6 3.3* 3.1*    CBG: Recent Labs  Lab 09/21/20 1137 09/21/20 1719 09/22/20 0508 09/22/20 0726 09/22/20 1125  GLUCAP 169* 141* 145* 134* 134*    Recent Results (from the past 240 hour(s))  Resp Panel by RT-PCR (Flu A&B, Covid) Nasopharyngeal Swab     Status: None   Collection Time: 09/17/20  6:01 PM   Specimen:  Nasopharyngeal Swab; Nasopharyngeal(NP) swabs in vial transport medium  Result Value Ref Range Status   SARS Coronavirus 2 by RT PCR NEGATIVE NEGATIVE Final    Comment: (NOTE) SARS-CoV-2 target nucleic acids are NOT DETECTED.  The SARS-CoV-2 RNA is generally detectable in upper respiratory specimens during the acute phase of infection. The lowest concentration of SARS-CoV-2 viral copies this assay can detect is 138 copies/mL. A negative result does not preclude SARS-Cov-2 infection and should not be used as the sole basis for treatment or other patient management decisions. A negative result may occur with  improper specimen collection/handling, submission of specimen other than nasopharyngeal swab, presence of viral mutation(s) within the areas targeted by this assay, and inadequate number of viral copies(<138 copies/mL). A negative result must be combined with clinical observations, patient history, and epidemiological information. The expected result is Negative.  Fact Sheet for Patients:  EntrepreneurPulse.com.au  Fact Sheet for Healthcare Providers:  IncredibleEmployment.be  This test is no t yet approved or cleared by the [place] FDA and  has been authorized for detection and/or diagnosis of SARS-CoV-2 by FDA under an Emergency Use Authorization (EUA). This EUA will remain  in effect (meaning this test can be used) for the duration of the COVID-19 declaration under Section 564(b)(1) of the Act, 21 U.S.C.section 360bbb-3(b)(1), unless the authorization is terminated  or revoked sooner.       Influenza A by PCR NEGATIVE NEGATIVE Final   Influenza B by PCR NEGATIVE NEGATIVE Final    Comment: (NOTE) The Xpert Xpress SARS-CoV-2/FLU/RSV plus assay is intended as an aid in the diagnosis of influenza from Nasopharyngeal swab specimens and should not be used as a sole basis for treatment. Nasal washings and aspirates are unacceptable for  Xpert Xpress SARS-CoV-2/FLU/RSV testing.  Fact Sheet for Patients: EntrepreneurPulse.com.au  Fact Sheet for Healthcare Providers: IncredibleEmployment.be  This test is not yet approved or cleared by the [place] FDA and has been authorized for detection and/or diagnosis of SARS-CoV-2 by FDA under an Emergency Use Authorization (EUA).  This EUA will remain in effect (meaning this test can be used) for the duration of the COVID-19 declaration under Section 564(b)(1) of the Act, 21 U.S.C. section 360bbb-3(b)(1), unless the authorization is terminated or revoked.  Performed at [facility], [address] [place], [place] [address]   MRSA Next Gen by PCR, Nasal     Status: None   Collection Time: 09/18/20  8:48 PM   Specimen: Nasal Mucosa; Nasal Swab  Result Value Ref Range Status   MRSA by PCR Next Gen NOT DETECTED NOT DETECTED Final    Comment: (NOTE) The GeneXpert MRSA Assay (FDA approved for NASAL specimens only), is one component of a comprehensive MRSA colonization surveillance program. It is not intended to diagnose MRSA infection nor to guide or monitor treatment for MRSA infections. Test performance is not FDA approved in patients less than 19 years old. Performed at [facility], [address] [place], [place] [address]   C Difficile Quick Screen w PCR reflex     Status: None   Collection Time: 09/19/20  6:41 PM   Specimen: STOOL  Result Value Ref Range Status   C Diff antigen NEGATIVE NEGATIVE Final   C Diff toxin NEGATIVE NEGATIVE Final   C Diff interpretation No C. difficile detected.  Final    Comment: Performed at [facility], [address] [place], [place] [address]     Radiology Studies: DG CHEST PORT 1 VIEW  Result Date: 09/22/2020 CLINICAL DATA:  Aspiration pneumonia, diabetes, hypertension EXAM: PORTABLE CHEST 1 VIEW COMPARISON:  09/19/2020 FINDINGS: Lungs are clear. No pneumothorax or pleural effusion. Cardiac size  is mildly enlarged, stable. Pulmonary vascularity is normal. Right humeral ORIF has been performed. IMPRESSION: No active disease.  Stable cardiomegaly. Electronically Signed   By: Fidela Salisbury MD   On: 09/22/2020 12:21    Scheduled Meds:  calcitRIOL  0.25 mcg Oral Q T,Th,Sa-HD   carvedilol  6.25 mg Oral BID WC   Chlorhexidine Gluconate Cloth  6 each Topical Q0600   cinacalcet  30 mg Oral QPM   enoxaparin (LOVENOX) injection  30 mg Subcutaneous Q24H   ferric citrate  420 mg Oral TID WC   folic acid  1 mg Oral q morning   hydrALAZINE  50 mg Oral Q8H   insulin aspart  0-6 Units Subcutaneous TID WC   insulin glargine-yfgn  2 Units Subcutaneous Daily   levETIRAcetam  1,000 mg Oral q morning   pantoprazole  40 mg Oral Daily   sertraline  50 mg Oral Daily   sodium chloride flush  10-40 mL Intracatheter Q12H   Continuous Infusions:  sodium chloride     sodium chloride      LOS: 4 days   Total time: 34 mins   Malasha Kleppe, MD How to contact the [facility] Attending or Consulting provider [place] or covering provider during after hours [place], for this patient?  Check the care team in [facility] and look for a) attending/consulting TRH provider listed and b) the [facility] team listed Log into [email or web address] and use [place]'s universal password to access. If you do not have the password, please contact the hospital operator. Locate the [facility] provider you are looking for under [facility] and page to a number that you can be directly reached. If you still have difficulty reaching the provider, please page the [facility] (Director on Call) for the Hospitalists listed on amion for assistance.  09/22/2020, 12:42 PM

## 2020-09-23 DIAGNOSIS — E162 Hypoglycemia, unspecified: Secondary | ICD-10-CM | POA: Diagnosis not present

## 2020-09-23 DIAGNOSIS — E872 Acidosis: Secondary | ICD-10-CM | POA: Diagnosis not present

## 2020-09-23 LAB — RENAL FUNCTION PANEL
Albumin: 3.4 g/dL — ABNORMAL LOW (ref 3.5–5.0)
Anion gap: 9 (ref 5–15)
BUN: 14 mg/dL (ref 8–23)
CO2: 24 mmol/L (ref 22–32)
Calcium: 7.9 mg/dL — ABNORMAL LOW (ref 8.9–10.3)
Chloride: 88 mmol/L — ABNORMAL LOW (ref 98–111)
Creatinine, Ser: 3.5 mg/dL — ABNORMAL HIGH (ref 0.44–1.00)
GFR, Estimated: 14 mL/min — ABNORMAL LOW (ref >60)
Glucose, Bld: 115 mg/dL — ABNORMAL HIGH (ref 70–99)
Phosphorus: 1.7 mg/dL — ABNORMAL LOW (ref 2.5–4.6)
Potassium: 3.3 mmol/L — ABNORMAL LOW (ref 3.5–5.1)
Sodium: 121 mmol/L — ABNORMAL LOW (ref 135–145)

## 2020-09-23 LAB — CBC
HCT: 30.3 % — ABNORMAL LOW (ref 36.0–46.0)
Hemoglobin: 10.2 g/dL — ABNORMAL LOW (ref 12.0–15.0)
MCH: 35.2 pg — ABNORMAL HIGH (ref 26.0–34.0)
MCHC: 33.7 g/dL (ref 30.0–36.0)
MCV: 104.5 fL — ABNORMAL HIGH (ref 80.0–100.0)
Platelets: 96 10*3/uL — ABNORMAL LOW (ref 150–400)
RBC: 2.9 MIL/uL — ABNORMAL LOW (ref 3.87–5.11)
RDW: 13.4 % (ref 11.5–15.5)
WBC: 4.2 10*3/uL (ref 4.0–10.5)
nRBC: 0 % (ref 0.0–0.2)

## 2020-09-23 LAB — GLUCOSE, CAPILLARY
Glucose-Capillary: 121 mg/dL — ABNORMAL HIGH (ref 70–99)
Glucose-Capillary: 134 mg/dL — ABNORMAL HIGH (ref 70–99)
Glucose-Capillary: 129 mg/dL — ABNORMAL HIGH (ref 70–99)
Glucose-Capillary: 82 mg/dL (ref 70–99)
Glucose-Capillary: 73 mg/dL (ref 70–99)

## 2020-09-23 LAB — RESP PANEL BY RT-PCR (FLU A&B, COVID) ARPGX2
Influenza A by PCR: NEGATIVE
Influenza B by PCR: NEGATIVE
SARS Coronavirus 2 by RT PCR: NEGATIVE

## 2020-09-23 LAB — MAGNESIUM: Magnesium: 2.1 mg/dL (ref 1.7–2.4)

## 2020-09-23 MED ORDER — DARBEPOETIN ALFA 40 MCG/0.4ML IJ SOSY
40.0000 ug | PREFILLED_SYRINGE | INTRAMUSCULAR | Status: DC
  Filled 2020-09-23: qty 0.4

## 2020-09-23 MED ORDER — CARVEDILOL 12.5 MG PO TABS
12.5000 mg | ORAL_TABLET | Freq: Two times a day (BID) | ORAL | Status: DC
  Administered 2020-09-23 – 2020-09-24 (×3): 12.5 mg via ORAL
  Filled 2020-09-23 (×3): qty 1

## 2020-09-23 MED ORDER — OXYMETAZOLINE HCL 0.05 % NA SOLN
2.0000 | Freq: Two times a day (BID) | NASAL | Status: DC | PRN
  Administered 2020-09-23: 2 via NASAL
  Filled 2020-09-23: qty 30

## 2020-09-23 NOTE — TOC Progression Note (Signed)
Transition of Care [facility]) - Progression Note    Patient Details  Name: Erica [name] MRN: [id number] Date of Birth: 01/04/1956  [facility]) CM/SW Contact  Shade Flood, LCSW Phone Number: 09/23/2020, 11:11 AM  Clinical Narrative:     TOC following. Per MD, anticipating dc tomorrow. Updated Melissa at [facility]. AC ordered covid test in anticipation of dc tomorrow.  Will follow up in AM.  Expected Discharge Plan: [place] Barriers to Discharge: Continued Medical Work up  Expected Discharge Plan and Services Expected Discharge Plan: [place] In-house Referral: Clinical Social Work     Living arrangements for the past 2 months: [place] Expected Discharge Date: 09/23/20                                     Social Determinants of Health (SDOH) Interventions    Readmission Risk Interventions Readmission Risk Prevention Plan 08/20/2020 06/11/2020 11/19/2019  Transportation Screening Complete Complete Complete  Medication Review Press photographer) Complete Complete Complete  PCP or Specialist appointment within 3-5 days of discharge Complete - Complete  HRI or Home Care Consult Complete Complete Complete  SW Recovery Care/Counseling Consult Complete Complete Complete  Palliative Care Screening Not Applicable Not Applicable Complete  Skilled Nursing Facility Complete Complete Complete  Some recent data might be hidden

## 2020-09-23 NOTE — Progress Notes (Signed)
Pt was found this am by nurse tech with large amount of blood on the bed and pillow. Dr. Olevia Bowens notified of most recent vital. Pt  states she has some pain on the left side of face.  Pt was cleaned up and gauze placed inside nostril. Awaiting orders. Awaiting orders

## 2020-09-23 NOTE — Progress Notes (Addendum)
Patient ID: Erica [name], female   DOB: 1955/09/25, 65 y.o.   MRN: [id number] S: Feels well, no complaints but apparently had a nose bleed this morning. O:BP (!) 149/103 (BP Location: Right Arm)   Pulse 79   Temp 98 F (36.7 C) (Oral)   Resp 18   Ht 5' (1.524 m)   Wt 49.9 kg   SpO2 100%   BMI 21.48 kg/m   Intake/Output Summary (Last 24 hours) at 09/23/2020 0911 Last data filed at 09/22/2020 2215 Gross per 24 hour  Intake 369.98 ml  Output --  Net 369.98 ml   Intake/Output: I/O last 3 completed shifts: In: 743.2 [P.O.:300; I.V.:403.3; IV Piggyback:40] Out: [id number] [Urine:100; [id number]  Intake/Output this shift:  No intake/output data recorded. Weight change:  Gen: NAD CVS: RRR Resp: CTA Abd: +BS, soft, NT/ND Ext: no edema, LAVG +T/B  Recent Labs  Lab 09/17/20 1505 09/17/20 1958 09/18/20 0411 09/18/20 1840 09/18/20 2205 09/19/20 1810 09/20/20 0407 09/21/20 0442 09/22/20 0610 09/23/20 0143  NA 130*   < > 134* 129* 129* 124* 128* 121* 123* 121*  K 5.4*   < > 4.5 5.0 4.3 3.6 2.9* 3.3* 3.1* 3.3*  CL 92*   < > 93* 93* 91* 89* 89* 85* 89* 88*  CO2 9*   < > 15* 11* 16* 18* '26 25 24 24  '$ GLUCOSE 42*   < > 127* 141* 120* 144* 37* 160* 131* 115*  BUN 27*   < > 31* 33* 32* 34* '12 13 9 14  '$ CREATININE 5.02*   < > 5.34* 5.91* 5.94* 6.44* 2.73* 3.51* 2.78* 3.50*  ALBUMIN 4.2  --  3.5  --  3.6 3.3* 3.6 3.3* 3.1* 3.4*  CALCIUM 9.9   < > 9.4 8.9 9.3 8.4* 8.4* 7.9* 7.8* 7.9*  PHOS  --   --   --   --  3.6 2.7 1.4* 2.2* 1.5* 1.7*  AST 39  --  24  --   --   --   --   --   --   --   ALT 21  --  16  --   --   --   --   --   --   --    < > = values in this interval not displayed.   Liver Function Tests: Recent Labs  Lab 09/17/20 1505 09/18/20 0411 09/18/20 2205 09/21/20 0442 09/22/20 0610 09/23/20 0143  AST 39 24  --   --   --   --   ALT 21 16  --   --   --   --   ALKPHOS 189* 147*  --   --   --   --   BILITOT 1.2 1.1  --   --   --   --   PROT 8.2* 6.5  --   --   --   --    ALBUMIN 4.2 3.5   < > 3.3* 3.1* 3.4*   < > = values in this interval not displayed.   No results for input(s): LIPASE, AMYLASE in the last 168 hours. No results for input(s): AMMONIA in the last 168 hours. CBC: Recent Labs  Lab 09/17/20 1505 09/18/20 0411 09/18/20 2205 09/19/20 1810 09/20/20 0407 09/21/20 0442  WBC 9.2 7.7 9.0 6.9 6.2 4.5  NEUTROABS 5.9  --   --   --   --   --   HGB 13.1 10.3* 11.2* 10.1* 10.1* 9.6*  HCT 44.0 33.7* 36.4 31.8*  30.8* 28.8*  MCV 119.9* 116.2* 114.5* 110.0* 106.9* 106.3*  PLT 266 211 187 141* 121* 95*   Cardiac Enzymes: No results for input(s): CKTOTAL, CKMB, CKMBINDEX, TROPONINI in the last 168 hours. CBG: Recent Labs  Lab 09/22/20 0726 09/22/20 1125 09/22/20 1616 09/22/20 2024 09/23/20 0248  GLUCAP 134* 134* 127* 141* 121*    Iron Studies: No results for input(s): IRON, TIBC, TRANSFERRIN, FERRITIN in the last 72 hours. Studies/Results: DG CHEST PORT 1 VIEW  Result Date: 09/22/2020 CLINICAL DATA:  Aspiration pneumonia, diabetes, hypertension EXAM: PORTABLE CHEST 1 VIEW COMPARISON:  09/19/2020 FINDINGS: Lungs are clear. No pneumothorax or pleural effusion. Cardiac size is mildly enlarged, stable. Pulmonary vascularity is normal. Right humeral ORIF has been performed. IMPRESSION: No active disease.  Stable cardiomegaly. Electronically Signed   By: Fidela Salisbury MD   On: 09/22/2020 12:21   DG Abd Portable 1V  Result Date: 09/22/2020 CLINICAL DATA:  Abdominal pain since this morning. EXAM: PORTABLE ABDOMEN - 1 VIEW COMPARISON:  9 chest x-ray on 10/28/2018, reported CT of the abdomen and pelvis on 08/15/2020. Images are not available. FINDINGS: Bowel gas pattern is nonobstructive. Radiopaque tablet overlies the LEFT UPPER QUADRANT. There is stable elevation of the RIGHT hemidiaphragm. Suspect persistent ascites, best seen along the LATERAL aspect of the liver. Coarse calcifications overlie the expected contour of the pancreas, consistent with  chronic pancreatitis. Remote RIGHT hip arthroplasty. IMPRESSION: Ascites.  Nonobstructive bowel gas pattern. Electronically Signed   By: Nolon Nations M.D.   On: 09/22/2020 13:33    calcitRIOL  0.25 mcg Oral Q T,Th,Sa-HD   carvedilol  12.5 mg Oral BID WC   Chlorhexidine Gluconate Cloth  6 each Topical Q0600   cinacalcet  30 mg Oral QPM   enoxaparin (LOVENOX) injection  30 mg Subcutaneous Q24H   ferric citrate  420 mg Oral TID WC   folic acid  1 mg Oral q morning   hydrALAZINE  50 mg Oral Q8H   insulin aspart  0-6 Units Subcutaneous TID WC   insulin glargine-yfgn  2 Units Subcutaneous Daily   levETIRAcetam  1,000 mg Oral q morning   pantoprazole  40 mg Oral Daily   sertraline  50 mg Oral Daily   sodium chloride flush  10-40 mL Intracatheter Q12H    BMET    Component Value Date/Time   NA 121 (L) 09/23/2020 0143   K 3.3 (L) 09/23/2020 0143   CL 88 (L) 09/23/2020 0143   CO2 24 09/23/2020 0143   GLUCOSE 115 (H) 09/23/2020 0143   BUN 14 09/23/2020 0143   CREATININE 3.50 (H) 09/23/2020 0143   CALCIUM 7.9 (L) 09/23/2020 0143   GFRNONAA 14 (L) 09/23/2020 0143   GFRAA 15 (L) 11/18/2019 0742   CBC    Component Value Date/Time   WBC 4.5 09/21/2020 0442   RBC 2.71 (L) 09/21/2020 0442   HGB 9.6 (L) 09/21/2020 0442   HCT 28.8 (L) 09/21/2020 0442   PLT 95 (L) 09/21/2020 0442   MCV 106.3 (H) 09/21/2020 0442   MCH 35.4 (H) 09/21/2020 0442   MCHC 33.3 09/21/2020 0442   RDW 14.2 09/21/2020 0442   LYMPHSABS 2.2 09/17/2020 1505   MONOABS 1.0 09/17/2020 1505   EOSABS 0.1 09/17/2020 1505   BASOSABS 0.0 09/17/2020 1505   outpatient orders are:EDW 42.5kg, Time: 3.75 hours, 2K/ 3 Ca, BFR 400, DFR 600,  Dialyzer: [place] Revacir 200, LUE AVG. Heparin 1000 unit load, 500 units/hr (stop 60 min before end of HD) -will obtain  outpatient records  Assessment/Plan:  ESRD - continue with TTS schedule Lactic acidosis - possibly due to metformin (although should not be given to anyone on dialysis).   Improved with HD and IVF's. Hypotension and hypothermia - improved Hypoglycemia - due to severe malnutrition Hyponatremia - persists despite HD.  Plan for HD tomorrow with UF and follow sodium level.  cortisol and tsh normal. ABLA - Hgb dropped from 13.1 on 09/17/20 down to 9.6 on 09/21/20.  Recheck today after nosebleed and will give ESA with HD tomorrow.   Donetta Potts, MD [employer] [phone]

## 2020-09-23 NOTE — Progress Notes (Signed)
TRH night shift.  The nursing staff reported that the patient was having epistaxis from her left nostril.  See previous progress note for further details.  Her blood pressure was also elevated.  Antihypertensives will be given earlier and we will try oxymetazoline spray on the left nostril.  Tennis Must, MD.

## 2020-09-23 NOTE — Progress Notes (Signed)
PROGRESS NOTE   CARESSA HERZBERG  [id number] DOB: [birth year] DOA: 09/17/2020 PCP: [name], NP   Chief Complaint  Patient presents with   Altered Mental Status   Level of care: Telemetry  Brief Admission History:  65 y.o. female with medical history significant of alcohol induced pancreatitis, alcohol abuse, history of CAD/NSTEMI, depression, type 2 diabetes, ESRD on dialysis (TTS) diastolic dysfunction, history of DKA, gastroparesis, GERD, heart murmur, nephrolithiasis, hyperlipidemia, hypertension, hypokalemia, muscle spasms, neuropathic pain and neuropathy, history of seizures who is coming to the emergency department via EMS due to altered mental status in the setting of hypoglycemia of 39 mg/dL.  She responded to dextrose.  Her mental status has improved.  She is now oriented to name, place, situation and oriented to time and date, except that she thought today was Wednesday.  She also had 2 episodes of emesis.  She denied headache, chest pain, dyspnea, abdominal or back pain at this time.  No orthopnea or PND.   She has had chills, but denied fever.  No rhinorrhea, sore throat, wheezing or hemoptysis. She occasionally gets lower extremity edema.  Denied diarrhea, melena or hematochezia.  She occasionally gets constipated.  She does not urinate much regularly.  No dysuria, frequency or hematuria.  Assessment & Plan:   Principal Problem:   Hypoglycemia Active Problems:   Nausea & vomiting   Metabolic acidosis   Tobacco abuse   Lactic acidosis   Essential hypertension   Diabetes mellitus type 2 in nonobese (HCC)   ESRD (end stage renal disease) (HCC)   Hyponatremia   Hyperkalemia   Elevated MCV   Altered mental status   CAD (coronary artery disease)  Severe lactic acidosis - severe lactic acid elevation likely secondary to metformin.  Not sure how she was started on this.  She was not discharged on this when she was here last month.  Likely was started at the nursing  facility.  With her chronic kidney disease she has developed a severe lactic acidosis secondary to metformin use.  This has been discontinued.  She is being treated supportively with IV fluid hydration and plan to follow lactic acid levels.  Severe metabolic acidosis/Starvation ketosis - Pt seems to have starvation ketosis from not enough insulin.  She is only 44 Kg.  She was started on low dose IV insulin infusion with dextrose infusion to help clear ketones.  Fortunately her ketones have improved and we have discontinued IV insulin infusion.  She is now on subcutaneous insulin.  Lantus 2 units daily plus SSI coverage as she is very insulin sensitive.  Stopped IV dextrose as she is eating well now.    Hypotension and hypothermia - central line placement completed. Hypothermia has resolved.  Critical care MD consultation requested.  Sepsis Ruled Out.     Hypoglycemia-Cortisol levels were good with both tests.  Patient has no glycemic reserves due to her severely emaciated state.  Pt is very sensitive to insulin and only very low doses would be given.  Ideally she should have an insulin pump but this would have to be arranged outpatient.  Hyponatremia - volume removal with hemodialysis.  Plan for HD treatment 09/23/20.   Type 1 diabetes mellitus-patient presented with very high ketone levels.  Ketones cleared with IV dextrose and IV insulin administration.  Ketosis has resolved.   Continue Lantus 2 units daily.  SSI very sensitive.  CBG (last 3)  Recent Labs    09/23/20 0248 09/23/20 1009 09/23/20 1106  GLUCAP 121* 134* 129*   End-stage renal disease-appreciate nephrology consultation and they are planning for hemodialysis therapy on 09/19/2020 and 09/21/20.  BPs have improved and will not require CRRT.     Hyperkalemia-secondary to metabolic acidosis this has been treated and resolved we will continue to monitor closely until acidosis has resolved.  Nausea and vomiting-this is resolved.  Essential  hypertension-BP has now rebounded and we have restarted carvedilol and hydralazine for BP control. Increased hydralazine to 50 mg TID.  Increased carvedilol to 12.5 mg BID.    Tobacco-counseled on complete cessation.  Unfortunately a family member brought the patient chewing tobacco on 09/21/2020 and she nearly aspirated on it.  I have made strict instructions to not allow family members to bring tobacco to patient while in the hospital.  Abdominal discomfort -  abdominal xray did not show any acute findings.   Epistaxis - treated with afrin nasal spray and pressure.  Seemed to have resolved at this time.       DVT prophylaxis: Enoxaparin Code Status: Full Family Communication: son updated 09/18/20, 09/20/20 telephone Disposition: anticipate return to SNF when medically stabilized Status is: Inpatient  Remains inpatient appropriate because:IV treatments appropriate due to intensity of illness or inability to take PO and Inpatient level of care appropriate due to severity of illness  Dispo: The patient is from: SNF              Anticipated d/c is to: SNF              Patient currently is not medically stable to d/c.   Difficult to place patient No   Consultants:  Nephrology   Procedures:  Central line placement 09/19/20   Antimicrobials:  N/a    Subjective: Pt without specific complaints.     Objective: Vitals:   09/22/20 1703 09/22/20 2020 09/23/20 0532 09/23/20 1010  BP: 126/80 (!) 160/73 (!) 149/103 (!) 170/67  Pulse: 74 70 79 74  Resp:  16 18   Temp:  98.3 F (36.8 C) 98 F (36.7 C)   TempSrc:  Oral Oral   SpO2:  100% 100%   Weight:      Height:        Intake/Output Summary (Last 24 hours) at 09/23/2020 1110 Last data filed at 09/22/2020 2215 Gross per 24 hour  Intake 119.98 ml  Output --  Net 119.98 ml   Filed Weights   09/19/20 0203 09/19/20 2215 09/21/20 1600  Weight: 43.6 kg 43.8 kg 49.9 kg   Examination:  General exam: chronically ill appearing female, lying  in bed arousable, oriented x2, Appears calm and comfortable.  Pt appears emaciated and grossly underweight. Edentulous. Respiratory system: Clear to auscultation. Respiratory effort normal. Cardiovascular system: normal S1 & S2 heard. No JVD, murmurs, rubs, gallops or clicks. No pedal edema. Gastrointestinal system: Abdomen is nondistended, soft and nontender. No organomegaly or masses felt. Normal bowel sounds heard. Central nervous system: Alert and oriented. No focal neurological deficits. Extremities: Symmetric 5 x 5 power. Skin: No rashes, lesions or ulcers Psychiatry: Judgement and insight appear poor. Mood & affect flat.    Data Reviewed: I have personally reviewed following labs and imaging studies  CBC: Recent Labs  Lab 09/17/20 1505 09/18/20 0411 09/18/20 2205 09/19/20 1810 09/20/20 0407 09/21/20 0442  WBC 9.2 7.7 9.0 6.9 6.2 4.5  NEUTROABS 5.9  --   --   --   --   --   HGB 13.1 10.3* 11.2* 10.1* 10.1* 9.6*  HCT 44.0 33.7* 36.4 31.8* 30.8* 28.8*  MCV 119.9* 116.2* 114.5* 110.0* 106.9* 106.3*  PLT 266 211 187 141* 121* 95*    Basic Metabolic Panel: Recent Labs  Lab 09/19/20 1810 09/20/20 0407 09/21/20 0442 09/22/20 0610 09/23/20 0143  NA 124* 128* 121* 123* 121*  K 3.6 2.9* 3.3* 3.1* 3.3*  CL 89* 89* 85* 89* 88*  CO2 18* '26 25 24 24  '$ GLUCOSE 144* 37* 160* 131* 115*  BUN 34* '12 13 9 14  '$ CREATININE 6.44* 2.73* 3.51* 2.78* 3.50*  CALCIUM 8.4* 8.4* 7.9* 7.8* 7.9*  MG 1.7 1.9 1.4* 1.8 2.1  PHOS 2.7 1.4* 2.2* 1.5* 1.7*    GFR: Estimated Creatinine Clearance: 11.5 mL/min (A) (by C-G formula based on SCr of 3.5 mg/dL (H)).  Liver Function Tests: Recent Labs  Lab 09/17/20 1505 09/18/20 0411 09/18/20 2205 09/19/20 1810 09/20/20 0407 09/21/20 0442 09/22/20 0610 09/23/20 0143  AST 39 24  --   --   --   --   --   --   ALT 21 16  --   --   --   --   --   --   ALKPHOS 189* 147*  --   --   --   --   --   --   BILITOT 1.2 1.1  --   --   --   --   --   --    PROT 8.2* 6.5  --   --   --   --   --   --   ALBUMIN 4.2 3.5   < > 3.3* 3.6 3.3* 3.1* 3.4*   < > = values in this interval not displayed.    CBG: Recent Labs  Lab 09/22/20 1616 09/22/20 2024 09/23/20 0248 09/23/20 1009 09/23/20 1106  GLUCAP 127* 141* 121* 134* 129*    Recent Results (from the past 240 hour(s))  Resp Panel by RT-PCR (Flu A&B, Covid) Nasopharyngeal Swab     Status: None   Collection Time: 09/17/20  6:01 PM   Specimen: Nasopharyngeal Swab; Nasopharyngeal(NP) swabs in vial transport medium  Result Value Ref Range Status   SARS Coronavirus 2 by RT PCR NEGATIVE NEGATIVE Final    Comment: (NOTE) SARS-CoV-2 target nucleic acids are NOT DETECTED.  The SARS-CoV-2 RNA is generally detectable in upper respiratory specimens during the acute phase of infection. The lowest concentration of SARS-CoV-2 viral copies this assay can detect is 138 copies/mL. A negative result does not preclude SARS-Cov-2 infection and should not be used as the sole basis for treatment or other patient management decisions. A negative result may occur with  improper specimen collection/handling, submission of specimen other than nasopharyngeal swab, presence of viral mutation(s) within the areas targeted by this assay, and inadequate number of viral copies(<138 copies/mL). A negative result must be combined with clinical observations, patient history, and epidemiological information. The expected result is Negative.  Fact Sheet for Patients:  EntrepreneurPulse.com.au  Fact Sheet for Healthcare Providers:  IncredibleEmployment.be  This test is no t yet approved or cleared by the [place] FDA and  has been authorized for detection and/or diagnosis of SARS-CoV-2 by FDA under an Emergency Use Authorization (EUA). This EUA will remain  in effect (meaning this test can be used) for the duration of the COVID-19 declaration under Section 564(b)(1) of the  Act, 21 U.S.C.section 360bbb-3(b)(1), unless the authorization is terminated  or revoked sooner.       Influenza A by PCR NEGATIVE NEGATIVE  Final   Influenza B by PCR NEGATIVE NEGATIVE Final    Comment: (NOTE) The Xpert Xpress SARS-CoV-2/FLU/RSV plus assay is intended as an aid in the diagnosis of influenza from Nasopharyngeal swab specimens and should not be used as a sole basis for treatment. Nasal washings and aspirates are unacceptable for Xpert Xpress SARS-CoV-2/FLU/RSV testing.  Fact Sheet for Patients: EntrepreneurPulse.com.au  Fact Sheet for Healthcare Providers: IncredibleEmployment.be  This test is not yet approved or cleared by the [place] FDA and has been authorized for detection and/or diagnosis of SARS-CoV-2 by FDA under an Emergency Use Authorization (EUA). This EUA will remain in effect (meaning this test can be used) for the duration of the COVID-19 declaration under Section 564(b)(1) of the Act, 21 U.S.C. section 360bbb-3(b)(1), unless the authorization is terminated or revoked.  Performed at [facility], [address], [place], [place] [address]   MRSA Next Gen by PCR, Nasal     Status: None   Collection Time: 09/18/20  8:48 PM   Specimen: Nasal Mucosa; Nasal Swab  Result Value Ref Range Status   MRSA by PCR Next Gen NOT DETECTED NOT DETECTED Final    Comment: (NOTE) The GeneXpert MRSA Assay (FDA approved for NASAL specimens only), is one component of a comprehensive MRSA colonization surveillance program. It is not intended to diagnose MRSA infection nor to guide or monitor treatment for MRSA infections. Test performance is not FDA approved in patients less than 83 years old. Performed at [facility], [address], [place], [place] [address]   C Difficile Quick Screen w PCR reflex     Status: None   Collection Time: 09/19/20  6:41 PM   Specimen: STOOL  Result Value Ref Range Status   C Diff antigen  NEGATIVE NEGATIVE Final   C Diff toxin NEGATIVE NEGATIVE Final   C Diff interpretation No C. difficile detected.  Final    Comment: Performed at [facility], [address], [place], [place] [address]     Radiology Studies: DG CHEST PORT 1 VIEW  Result Date: 09/22/2020 CLINICAL DATA:  Aspiration pneumonia, diabetes, hypertension EXAM: PORTABLE CHEST 1 VIEW COMPARISON:  09/19/2020 FINDINGS: Lungs are clear. No pneumothorax or pleural effusion. Cardiac size is mildly enlarged, stable. Pulmonary vascularity is normal. Right humeral ORIF has been performed. IMPRESSION: No active disease.  Stable cardiomegaly. Electronically Signed   By: Fidela Salisbury MD   On: 09/22/2020 12:21   DG Abd Portable 1V  Result Date: 09/22/2020 CLINICAL DATA:  Abdominal pain since this morning. EXAM: PORTABLE ABDOMEN - 1 VIEW COMPARISON:  9 chest x-ray on 10/28/2018, reported CT of the abdomen and pelvis on 08/15/2020. Images are not available. FINDINGS: Bowel gas pattern is nonobstructive. Radiopaque tablet overlies the LEFT UPPER QUADRANT. There is stable elevation of the RIGHT hemidiaphragm. Suspect persistent ascites, best seen along the LATERAL aspect of the liver. Coarse calcifications overlie the expected contour of the pancreas, consistent with chronic pancreatitis. Remote RIGHT hip arthroplasty. IMPRESSION: Ascites.  Nonobstructive bowel gas pattern. Electronically Signed   By: Nolon Nations M.D.   On: 09/22/2020 13:33    Scheduled Meds:  calcitRIOL  0.25 mcg Oral Q T,Th,Sa-HD   carvedilol  12.5 mg Oral BID WC   Chlorhexidine Gluconate Cloth  6 each Topical Q0600   cinacalcet  30 mg Oral QPM   [START ON 09/24/2020] darbepoetin (ARANESP) injection - DIALYSIS  40 mcg Intravenous Q Tue-HD   enoxaparin (LOVENOX) injection  30 mg Subcutaneous Q24H   ferric citrate  420 mg Oral TID WC   folic acid  1 mg Oral q morning   hydrALAZINE  50 mg Oral Q8H   insulin aspart  0-6 Units Subcutaneous TID WC   insulin  glargine-yfgn  2 Units Subcutaneous Daily   levETIRAcetam  1,000 mg Oral q morning   pantoprazole  40 mg Oral Daily   sertraline  50 mg Oral Daily   sodium chloride flush  10-40 mL Intracatheter Q12H   Continuous Infusions:  sodium chloride     sodium chloride      LOS: 5 days   Total time: 35 mins   Esai Stecklein, MD How to contact the [facility] Attending or Consulting provider [place] or covering provider during after hours [place], for this patient?  Check the care team in Operating Room Services and look for a) attending/consulting TRH provider listed and b) the [facility] team listed Log into [email or web address] and use [place]'s universal password to access. If you do not have the password, please contact the hospital operator. Locate the [facility] provider you are looking for under [facility] and page to a number that you can be directly reached. If you still have difficulty reaching the provider, please page the [facility] (Director on Call) for the Hospitalists listed on amion for assistance.  09/23/2020, 11:10 AM

## 2020-09-23 NOTE — Progress Notes (Signed)
epistaxis from her left nostril.Her blood pressure was also elevated.  oxymetazoline spray on the left nostril given 0605. Will continue to monitor. Will provide on coming nurse update in shift report.

## 2020-09-24 DIAGNOSIS — R718 Other abnormality of red blood cells: Secondary | ICD-10-CM | POA: Diagnosis not present

## 2020-09-24 DIAGNOSIS — E162 Hypoglycemia, unspecified: Secondary | ICD-10-CM | POA: Diagnosis not present

## 2020-09-24 DIAGNOSIS — R400 Somnolence: Secondary | ICD-10-CM | POA: Diagnosis not present

## 2020-09-24 DIAGNOSIS — N186 End stage renal disease: Secondary | ICD-10-CM | POA: Diagnosis not present

## 2020-09-24 LAB — RENAL FUNCTION PANEL
Albumin: 3.3 g/dL — ABNORMAL LOW (ref 3.5–5.0)
Anion gap: 12 (ref 5–15)
BUN: 20 mg/dL (ref 8–23)
CO2: 21 mmol/L — ABNORMAL LOW (ref 22–32)
Calcium: 7.9 mg/dL — ABNORMAL LOW (ref 8.9–10.3)
Chloride: 86 mmol/L — ABNORMAL LOW (ref 98–111)
Creatinine, Ser: 4.12 mg/dL — ABNORMAL HIGH (ref 0.44–1.00)
GFR, Estimated: 11 mL/min — ABNORMAL LOW (ref >60)
Glucose, Bld: 72 mg/dL (ref 70–99)
Phosphorus: 1.9 mg/dL — ABNORMAL LOW (ref 2.5–4.6)
Potassium: 4 mmol/L (ref 3.5–5.1)
Sodium: 119 mmol/L — CL (ref 135–145)

## 2020-09-24 LAB — CBC
HCT: 29 % — ABNORMAL LOW (ref 36.0–46.0)
Hemoglobin: 10.1 g/dL — ABNORMAL LOW (ref 12.0–15.0)
MCH: 35.4 pg — ABNORMAL HIGH (ref 26.0–34.0)
MCHC: 34.8 g/dL (ref 30.0–36.0)
MCV: 101.8 fL — ABNORMAL HIGH (ref 80.0–100.0)
Platelets: 110 10*3/uL — ABNORMAL LOW (ref 150–400)
RBC: 2.85 MIL/uL — ABNORMAL LOW (ref 3.87–5.11)
RDW: 13.1 % (ref 11.5–15.5)
WBC: 4.4 10*3/uL (ref 4.0–10.5)
nRBC: 0 % (ref 0.0–0.2)

## 2020-09-24 LAB — GLUCOSE, CAPILLARY
Glucose-Capillary: 88 mg/dL (ref 70–99)
Glucose-Capillary: 72 mg/dL (ref 70–99)
Glucose-Capillary: 76 mg/dL (ref 70–99)
Glucose-Capillary: 152 mg/dL — ABNORMAL HIGH (ref 70–99)

## 2020-09-24 LAB — CREATININE, SERUM
Creatinine, Ser: 4.14 mg/dL — ABNORMAL HIGH (ref 0.44–1.00)
GFR, Estimated: 11 mL/min — ABNORMAL LOW (ref >60)

## 2020-09-24 MED ORDER — DARBEPOETIN ALFA 40 MCG/0.4ML IJ SOSY
PREFILLED_SYRINGE | INTRAMUSCULAR | Status: AC
  Administered 2020-09-24: 40 ug via INTRAVENOUS
  Filled 2020-09-24: qty 0.4

## 2020-09-24 MED ORDER — INSULIN GLARGINE-YFGN 100 UNIT/ML ~~LOC~~ SOLN
1.0000 [IU] | Freq: Every day | SUBCUTANEOUS | Status: DC
  Filled 2020-09-24 (×2): qty 0.01

## 2020-09-24 MED ORDER — INSULIN GLARGINE-YFGN 100 UNIT/ML ~~LOC~~ SOLN
1.0000 [IU] | Freq: Every day | SUBCUTANEOUS | 11 refills | Status: DC
Start: 2020-09-24 — End: 2020-11-25

## 2020-09-24 MED ORDER — CARVEDILOL 12.5 MG PO TABS: 12.5000 mg | ORAL_TABLET | Freq: Two times a day (BID) | ORAL | Status: DC

## 2020-09-24 MED ORDER — NEPRO/CARBSTEADY PO LIQD
ORAL | Status: AC
  Filled 2020-09-24: qty 237

## 2020-09-24 MED ORDER — HYDRALAZINE HCL 25 MG PO TABS: 50.0000 mg | ORAL_TABLET | Freq: Three times a day (TID) | ORAL | Status: DC

## 2020-09-24 NOTE — TOC Transition Note (Signed)
Transition of Care [facility]) - CM/SW Discharge Note   Patient Details  Name: GRETEL VINK MRN: [id number] Date of Birth: [birth year]  Transition of Care [facility]) CM/SW Contact:  Shade Flood, LCSW Phone Number: 09/24/2020, 2:09 PM   Clinical Narrative:     Pt stable for dc back to [facility] today per MD. Dominica Severin at [facility]. DC clinical sent electronically. RN to call report. Pt on EMS list for transport. If EMS has availability, they will get her this evening. If not, they will get her in the AM. Updated RN.  Notified pt's son. No other TOC needs for dc.  Final next level of care: Skilled Nursing Facility Barriers to Discharge: Barriers Resolved   Patient Goals and CMS Choice        Discharge Placement                       Discharge Plan and Services In-house Referral: Clinical Social Work                                   Social Determinants of Health (SDOH) Interventions     Readmission Risk Interventions Readmission Risk Prevention Plan 08/20/2020 06/11/2020 11/19/2019  Transportation Screening Complete Complete Complete  Medication Review Press photographer) Complete Complete Complete  PCP or Specialist appointment within 3-5 days of discharge Complete - Complete  HRI or Home Care Consult Complete Complete Complete  SW Recovery Care/Counseling Consult Complete Complete Complete  Palliative Care Screening Not Applicable Not Applicable Complete  Skilled Nursing Facility Complete Complete Complete  Some recent data might be hidden

## 2020-09-24 NOTE — Procedures (Signed)
    HEMODIALYSIS TREATMENT NOTE:  3.5 hour heparin-free treatment completed via left upper arm AVG (15g/retrograde flow).  Base Na setting lowered from 138 to 130 mEq/L (lowest possible setting) due to hyponatremia.  Goal met: 2 liters removed.  Hemodynamically stable throughout session; did not require Albumin as anticipated.  All blood was returned and hemostasis was achieved in 15 minutes.   Rockwell Alexandria, RN

## 2020-09-24 NOTE — Progress Notes (Signed)
Nsg Discharge Note  Admit Date:  09/17/2020 Discharge date: 09/24/2020   Erica [name] to be D/C'd Skilled nursing facility per MD order.  AVS completed.  Copy for chart, and copy for patient signed, and dated. Patient/caregiver able to verbalize understanding.  Discharge Medication: Allergies as of 09/24/2020       Reactions   Aspirin Palpitations, Other (See Comments)   Listed on [facility] 11/12/18        Medication List     STOP taking these medications    diclofenac Sodium 1 % Gel Commonly known as: VOLTAREN   metFORMIN 500 MG (MOD) 24 hr tablet Commonly known as: GLUMETZA   NIFEdipine 60 MG 24 hr tablet Commonly known as: PROCARDIA XL/NIFEDICAL XL   NovoLOG FlexPen 100 UNIT/ML FlexPen Generic drug: insulin aspart   sertraline 50 MG tablet Commonly known as: ZOLOFT   Vitamin D (Ergocalciferol) 1.25 MG (50000 UNIT) Caps capsule Commonly known as: DRISDOL       TAKE these medications    acetaminophen 325 MG tablet Commonly known as: TYLENOL Take 650 mg by mouth every 4 (four) hours as needed for moderate pain.   albuterol 108 (90 Base) MCG/ACT inhaler Commonly known as: VENTOLIN HFA Inhale 2 puffs into the lungs every 4 (four) hours as needed for wheezing or shortness of breath.   calcitRIOL 0.25 MCG capsule Commonly known as: ROCALTROL Take 1 capsule (0.25 mcg total) by mouth Every Tuesday,Thursday,and Saturday with dialysis.   carvedilol 12.5 MG tablet Commonly known as: COREG Take 1 tablet (12.5 mg total) by mouth 2 (two) times daily. What changed:  medication strength how much to take   cinacalcet 30 MG tablet Commonly known as: SENSIPAR Take 30 mg by mouth every evening.   ferric citrate 1 GM 210 MG(Fe) tablet Commonly known as: AURYXIA Take 2 tablets (420 mg total) by mouth 3 (three) times daily with meals.   folic acid 1 MG tablet Commonly known as: FOLVITE Take 1 mg by mouth every morning.   hydrALAZINE 25 MG tablet Commonly known as:  APRESOLINE Take 2 tablets (50 mg total) by mouth every 8 (eight) hours. What changed: how much to take   insulin glargine-yfgn 100 UNIT/ML injection Commonly known as: SEMGLEE Inject 0.01 mLs (1 Units total) into the skin daily.   levETIRAcetam 1000 MG tablet Commonly known as: KEPPRA Take 1,000 mg by mouth every morning.   loperamide 2 MG capsule Commonly known as: IMODIUM Take 4 mg by mouth daily as needed for diarrhea or loose stools.   pantoprazole 40 MG tablet Commonly known as: Protonix Take 1 tablet (40 mg total) by mouth daily.   promethazine 25 MG tablet Commonly known as: PHENERGAN Take 25 mg by mouth every 8 (eight) hours as needed for nausea or vomiting.        Discharge Assessment: Vitals:   09/24/20 1430 09/24/20 1531  BP: 133/67 120/63  Pulse: 68 67  Resp: 14 20  Temp: 97.9 F (36.6 C) 97.8 F (36.6 C)  SpO2: 97% 98%   Skin clean, dry and intact without evidence of skin break down, no evidence of skin tears noted. IV catheter discontinued intact. Site without signs and symptoms of complications - no redness or edema noted at insertion site, patient denies c/o pain - only slight tenderness at site.  Dressing with slight pressure applied.  D/c Instructions-Education: Discharge instructions given to patient/family with verbalized understanding. D/c education completed with patient/family including follow up instructions, medication list, d/c activities limitations  if indicated, with other d/c instructions as indicated by MD - patient able to verbalize understanding, all questions fully answered. Patient instructed to return to ED, call 911, or call MD for any changes in condition.  Patient escorted via [place], and D/C home via private auto.  Dorcas Mcmurray, LPN [id number] PM

## 2020-09-24 NOTE — Discharge Summary (Signed)
Physician Discharge Summary  Erica [name] [id number] DOB: November 02, 1955 DOA: 09/17/2020  PCP: [name], NP  Admit date: 09/17/2020 Discharge date: 09/24/2020  Admitted From: Disposition:   Recommendations for Outpatient Follow-up:  Resume regular hemodialysis scheduled PLEASE DO NOT GIVE PATIENT METFORMIN Please check renal function panel in 1 week to follow sodium levels DC zoloft for now due to low sodium levels.  Please check blood sugar 5 times per day  Outpatient palliative medicine consultation recommended    Discharge Condition: STABLE   CODE STATUS: FULL DIET: renal diet recommended    Brief Hospitalization Summary: Please see all hospital notes, images, labs for full details of the hospitalization. ADMISSION HPI: Erica [name] is a 65 y.o. female with medical history significant of alcohol induced pancreatitis, alcohol abuse, history of CAD/NSTEMI, depression, type 2 diabetes, ESRD on dialysis (TTS) diastolic dysfunction, history of DKA, gastroparesis, GERD, heart murmur, nephrolithiasis, hyperlipidemia, hypertension, hypokalemia, muscle spasms, neuropathic pain and neuropathy, history of seizures who is coming to the emergency department via EMS due to altered mental status in the setting of hypoglycemia of 39 mg/dL.  She responded to dextrose.  Her mental status has improved.  She is now oriented to name, place, situation and oriented to time and date, except that she thought today was Wednesday.  She also had 2 episodes of emesis.  She denied headache, chest pain, dyspnea, abdominal or back pain at this time.  No orthopnea or PND.   She has had chills, but denied fever.  No rhinorrhea, sore throat, wheezing or hemoptysis. She occasionally gets lower extremity edema.  Denied diarrhea, melena or hematochezia.  She occasionally gets constipated.  She does not urinate much regularly.  No dysuria, frequency or hematuria.   ED Course: Initial vital signs were  temperature 97.5 F, pulse 80, respirations 22, BP 105/54 mmHg and O2 sat 97% on room air.  EMS gave dextrose.  She received a 500 mL NS bolus   Lab work: CBC showed a white count of 9.2, hemoglobin 13.1 g/dL with an MCV of 119.9 fL and platelets 266.  Initial CBG was 39 mg/dL.  CMP showed a sodium of 130 30, potassium 5.4, chloride 92 and CO2 9 mmol/L.  Anion gap was 29.  Glucose was 42, BUN 27 and creatinine 5.02 mg/dL.  Total protein was 8.2 g/dL and alkaline phosphatase 189 units/L.  The rest of the LFTs were normal.  Venous blood gas showed a pH of 7.00, PCO2 of 35.9 and PO2 of 44.6 mmHg.  Bicarbonate was 10.3 and acid base deficit of 17.9 mmol/L.  Lactic acid is more than 11 and beta hydroxybutyric acid was 6.97 mmol/L.  Repeat BMP showed improved CO2 at 12 mmol/help with an improved anion gap down to 24.  Imaging: Her portable chest radiograph showed cardiomegaly with vascular congestion similar to prior film.  Please see image and full radiology report for further detail.  HOSPITAL COURSE BY PROBLEM    Severe metformin induced lactic acidosis - severe lactic acid elevation likely secondary to metformin.  Not sure how she was started on this.  She was not discharged on this when she was here last month.  Likely was started at the nursing facility.  With her chronic kidney disease she has developed a severe lactic acidosis secondary to metformin use.  This has been discontinued.  She was treated supportively with IV fluid hydration and plan to follow lactic acid levels.   Severe metabolic acidosis/Starvation ketosis - Pt seems to  have starvation ketosis from not enough insulin.  She is only 44 Kg.  She was started on low dose IV insulin infusion with dextrose infusion to help clear ketones.  Fortunately her ketones have improved and we have discontinued IV insulin infusion.  She is now on subcutaneous insulin.  Lantus 2 units daily plus SSI coverage as she is very insulin sensitive.  Stopped IV  dextrose as she is eating well now.     Hypotension and hypothermia - central line placement completed. Hypothermia has resolved.  Critical care MD consultation requested.  Sepsis Ruled Out.      Hypoglycemia-Cortisol levels were good with both tests.  Patient has no glycemic reserves due to her severely emaciated state.  Pt is very sensitive to insulin and only very low doses would be given.  Ideally she should have an insulin pump but this would have to be arranged outpatient.   Hyponatremia - ASYMPTOMATIC.  volume removal with hemodialysis.  Plan for HD treatment 09/24/20.  Discussed with Dr. Marval Regal.  Ok to discharge. She is asymptomatic.  Hold Zoloft for now.  Outpatient follow up with renal function panel in 1 week to monitor sodium level.    Type 1 diabetes mellitus-patient presented with very high ketone levels.  Ketones cleared with IV dextrose and IV insulin administration.  Ketosis has resolved.   Continue Lantus 1 units daily. CBG (last 3)  Recent Labs    09/24/20 0334 09/24/20 0600 09/24/20 0731  GLUCAP 88 72 76     End-stage renal disease-appreciate nephrology consultation and they are planning for hemodialysis therapy on 09/19/2020 and 09/21/20.  BPs have improved and will not require CRRT.      Hyperkalemia-secondary to metabolic acidosis this has been treated and resolved we will continue to monitor closely until acidosis has resolved.   Nausea and vomiting-this is resolved.   Essential hypertension-BP has now rebounded and we have restarted carvedilol and hydralazine for BP control. Increased hydralazine to 50 mg TID.  Increased carvedilol to 12.5 mg BID.     Tobacco-counseled on complete cessation.  Unfortunately a family member brought the patient chewing tobacco on 09/21/2020 and she nearly aspirated on it.  I have made strict instructions to not allow family members to bring tobacco to patient while in the hospital.   Abdominal discomfort -  abdominal xray did not show  any acute findings.   Epistaxis - treated with afrin nasal spray and pressure.  Seemed to have resolved at this time.        DVT prophylaxis: Enoxaparin Code Status: Full Family Communication: son updated 09/18/20, 09/20/20 telephone Disposition: anticipate return to SNF when medically stabilized Status is: Inpatient   Remains inpatient appropriate because:IV treatments appropriate due to intensity of illness or inability to take PO and Inpatient level of care appropriate due to severity of illness   Dispo: The patient is from: SNF              Anticipated d/c is to: SNF              Patient currently is not medically stable to d/c.              Difficult to place patient No   Consultants:  Nephrology   Procedures:  Central line placement 09/19/20   Discharge Diagnoses:  Principal Problem:   Hypoglycemia Active Problems:   Nausea & vomiting   Metabolic acidosis   Tobacco abuse   Lactic acidosis   Essential hypertension  Diabetes mellitus type 2 in nonobese (HCC)   ESRD (end stage renal disease) (HCC)   Hyponatremia   Hyperkalemia   Elevated MCV   Altered mental status   CAD (coronary artery disease)   Discharge Instructions:  Allergies as of 09/24/2020       Reactions   Aspirin Palpitations, Other (See Comments)   Listed on [facility] 11/12/18        Medication List     STOP taking these medications    diclofenac Sodium 1 % Gel Commonly known as: VOLTAREN   metFORMIN 500 MG (MOD) 24 hr tablet Commonly known as: GLUMETZA   NIFEdipine 60 MG 24 hr tablet Commonly known as: PROCARDIA XL/NIFEDICAL XL   NovoLOG FlexPen 100 UNIT/ML FlexPen Generic drug: insulin aspart   sertraline 50 MG tablet Commonly known as: ZOLOFT   Vitamin D (Ergocalciferol) 1.25 MG (50000 UNIT) Caps capsule Commonly known as: DRISDOL       TAKE these medications    acetaminophen 325 MG tablet Commonly known as: TYLENOL Take 650 mg by mouth every 4 (four) hours as needed for moderate  pain.   albuterol 108 (90 Base) MCG/ACT inhaler Commonly known as: VENTOLIN HFA Inhale 2 puffs into the lungs every 4 (four) hours as needed for wheezing or shortness of breath.   calcitRIOL 0.25 MCG capsule Commonly known as: ROCALTROL Take 1 capsule (0.25 mcg total) by mouth Every Tuesday,Thursday,and Saturday with dialysis.   carvedilol 12.5 MG tablet Commonly known as: COREG Take 1 tablet (12.5 mg total) by mouth 2 (two) times daily. What changed:  medication strength how much to take   cinacalcet 30 MG tablet Commonly known as: SENSIPAR Take 30 mg by mouth every evening.   ferric citrate 1 GM 210 MG(Fe) tablet Commonly known as: AURYXIA Take 2 tablets (420 mg total) by mouth 3 (three) times daily with meals.   folic acid 1 MG tablet Commonly known as: FOLVITE Take 1 mg by mouth every morning.   hydrALAZINE 25 MG tablet Commonly known as: APRESOLINE Take 2 tablets (50 mg total) by mouth every 8 (eight) hours. What changed: how much to take   insulin glargine-yfgn 100 UNIT/ML injection Commonly known as: SEMGLEE Inject 0.01 mLs (1 Units total) into the skin daily.   levETIRAcetam 1000 MG tablet Commonly known as: KEPPRA Take 1,000 mg by mouth every morning.   loperamide 2 MG capsule Commonly known as: IMODIUM Take 4 mg by mouth daily as needed for diarrhea or loose stools.   pantoprazole 40 MG tablet Commonly known as: Protonix Take 1 tablet (40 mg total) by mouth daily.   promethazine 25 MG tablet Commonly known as: PHENERGAN Take 25 mg by mouth every 8 (eight) hours as needed for nausea or vomiting.        Allergies  Allergen Reactions   Aspirin Palpitations and Other (See Comments)    Listed on [facility] 11/12/18   Allergies as of 09/24/2020       Reactions   Aspirin Palpitations, Other (See Comments)   Listed on [facility] 11/12/18        Medication List     STOP taking these medications    diclofenac Sodium 1 % Gel Commonly known as: VOLTAREN    metFORMIN 500 MG (MOD) 24 hr tablet Commonly known as: GLUMETZA   NIFEdipine 60 MG 24 hr tablet Commonly known as: PROCARDIA XL/NIFEDICAL XL   NovoLOG FlexPen 100 UNIT/ML FlexPen Generic drug: insulin aspart   sertraline 50 MG tablet Commonly known  as: ZOLOFT   Vitamin D (Ergocalciferol) 1.25 MG (50000 UNIT) Caps capsule Commonly known as: DRISDOL       TAKE these medications    acetaminophen 325 MG tablet Commonly known as: TYLENOL Take 650 mg by mouth every 4 (four) hours as needed for moderate pain.   albuterol 108 (90 Base) MCG/ACT inhaler Commonly known as: VENTOLIN HFA Inhale 2 puffs into the lungs every 4 (four) hours as needed for wheezing or shortness of breath.   calcitRIOL 0.25 MCG capsule Commonly known as: ROCALTROL Take 1 capsule (0.25 mcg total) by mouth Every Tuesday,Thursday,and Saturday with dialysis.   carvedilol 12.5 MG tablet Commonly known as: COREG Take 1 tablet (12.5 mg total) by mouth 2 (two) times daily. What changed:  medication strength how much to take   cinacalcet 30 MG tablet Commonly known as: SENSIPAR Take 30 mg by mouth every evening.   ferric citrate 1 GM 210 MG(Fe) tablet Commonly known as: AURYXIA Take 2 tablets (420 mg total) by mouth 3 (three) times daily with meals.   folic acid 1 MG tablet Commonly known as: FOLVITE Take 1 mg by mouth every morning.   hydrALAZINE 25 MG tablet Commonly known as: APRESOLINE Take 2 tablets (50 mg total) by mouth every 8 (eight) hours. What changed: how much to take   insulin glargine-yfgn 100 UNIT/ML injection Commonly known as: SEMGLEE Inject 0.01 mLs (1 Units total) into the skin daily.   levETIRAcetam 1000 MG tablet Commonly known as: KEPPRA Take 1,000 mg by mouth every morning.   loperamide 2 MG capsule Commonly known as: IMODIUM Take 4 mg by mouth daily as needed for diarrhea or loose stools.   pantoprazole 40 MG tablet Commonly known as: Protonix Take 1 tablet (40 mg  total) by mouth daily.   promethazine 25 MG tablet Commonly known as: PHENERGAN Take 25 mg by mouth every 8 (eight) hours as needed for nausea or vomiting.        Procedures/Studies: DG CHEST PORT 1 VIEW  Result Date: 09/22/2020 CLINICAL DATA:  Aspiration pneumonia, diabetes, hypertension EXAM: PORTABLE CHEST 1 VIEW COMPARISON:  09/19/2020 FINDINGS: Lungs are clear. No pneumothorax or pleural effusion. Cardiac size is mildly enlarged, stable. Pulmonary vascularity is normal. Right humeral ORIF has been performed. IMPRESSION: No active disease.  Stable cardiomegaly. Electronically Signed   By: Fidela Salisbury MD   On: 09/22/2020 12:21   DG Chest Port 1 View  Result Date: 09/19/2020 CLINICAL DATA:  Multiple central line attempts EXAM: PORTABLE CHEST 1 VIEW COMPARISON:  09/17/2020 FINDINGS: Mild cardiomegaly. No focal opacity or pleural effusion. No definitive pneumothorax. IMPRESSION: No active disease.  Mild cardiomegaly Electronically Signed   By: Donavan Foil M.D.   On: 09/19/2020 16:38   DG Chest Port 1 View  Result Date: 09/17/2020 CLINICAL DATA:  Weakness and altered mental status. Missed dialysis. EXAM: PORTABLE CHEST 1 VIEW COMPARISON:  August 15, 2020. FINDINGS: Trachea is midline. Cardiomediastinal contours with cardiac enlargement as before. Fullness of central pulmonary vasculature. Hilar structures with stable appearance. No lobar consolidative changes. No sign of pleural effusion. EKG leads projecting over the chest. No acute skeletal process on limited assessment. IMPRESSION: Cardiomegaly with vascular congestion. No change from the prior study. Electronically Signed   By: Zetta Bills M.D.   On: 09/17/2020 15:47   DG Abd Portable 1V  Result Date: 09/22/2020 CLINICAL DATA:  Abdominal pain since this morning. EXAM: PORTABLE ABDOMEN - 1 VIEW COMPARISON:  9 chest x-ray on 10/28/2018, reported  CT of the abdomen and pelvis on 08/15/2020. Images are not available. FINDINGS: Bowel gas  pattern is nonobstructive. Radiopaque tablet overlies the LEFT UPPER QUADRANT. There is stable elevation of the RIGHT hemidiaphragm. Suspect persistent ascites, best seen along the LATERAL aspect of the liver. Coarse calcifications overlie the expected contour of the pancreas, consistent with chronic pancreatitis. Remote RIGHT hip arthroplasty. IMPRESSION: Ascites.  Nonobstructive bowel gas pattern. Electronically Signed   By: Nolon Nations M.D.   On: 09/22/2020 13:33     Subjective: Pt without any specific complaints.   Discharge Exam: Vitals:   09/24/20 1215 09/24/20 1230  BP: 112/63 (!) 103/59  Pulse: 70 63  Resp: 14 16  Temp:    SpO2:     Vitals:   09/24/20 1145 09/24/20 1200 09/24/20 1215 09/24/20 1230  BP: (!) 113/55 124/60 112/63 (!) 103/59  Pulse: 62 64 70 63  Resp: '11 12 14 16  '$ Temp:      TempSrc:      SpO2:      Weight:      Height:       General exam: chronically ill appearing female, lying in bed arousable, oriented x2, Appears calm and comfortable.  Pt appears emaciated and grossly underweight. Edentulous. Respiratory system: Clear to auscultation. Respiratory effort normal. Cardiovascular system: normal S1 & S2 heard. No JVD, murmurs, rubs, gallops or clicks. No pedal edema. Gastrointestinal system: Abdomen is nondistended, soft and nontender. No organomegaly or masses felt. Normal bowel sounds heard. Central nervous system: Alert and oriented. No focal neurological deficits. Extremities: Symmetric 5 x 5 power. Skin: No rashes, lesions or ulcers Psychiatry: Judgement and insight appear poor. Mood & affect flat.   The results of significant diagnostics from this hospitalization (including imaging, microbiology, ancillary and laboratory) are listed below for reference.     Microbiology: Recent Results (from the past 240 hour(s))  Resp Panel by RT-PCR (Flu A&B, Covid) Nasopharyngeal Swab     Status: None   Collection Time: 09/17/20  6:01 PM   Specimen:  Nasopharyngeal Swab; Nasopharyngeal(NP) swabs in vial transport medium  Result Value Ref Range Status   SARS Coronavirus 2 by RT PCR NEGATIVE NEGATIVE Final    Comment: (NOTE) SARS-CoV-2 target nucleic acids are NOT DETECTED.  The SARS-CoV-2 RNA is generally detectable in upper respiratory specimens during the acute phase of infection. The lowest concentration of SARS-CoV-2 viral copies this assay can detect is 138 copies/mL. A negative result does not preclude SARS-Cov-2 infection and should not be used as the sole basis for treatment or other patient management decisions. A negative result may occur with  improper specimen collection/handling, submission of specimen other than nasopharyngeal swab, presence of viral mutation(s) within the areas targeted by this assay, and inadequate number of viral copies(<138 copies/mL). A negative result must be combined with clinical observations, patient history, and epidemiological information. The expected result is Negative.  Fact Sheet for Patients:  EntrepreneurPulse.com.au  Fact Sheet for Healthcare Providers:  IncredibleEmployment.be  This test is no t yet approved or cleared by the [place] FDA and  has been authorized for detection and/or diagnosis of SARS-CoV-2 by FDA under an Emergency Use Authorization (EUA). This EUA will remain  in effect (meaning this test can be used) for the duration of the COVID-19 declaration under Section 564(b)(1) of the Act, 21 U.S.C.section 360bbb-3(b)(1), unless the authorization is terminated  or revoked sooner.       Influenza A by PCR NEGATIVE NEGATIVE Final   Influenza B  by PCR NEGATIVE NEGATIVE Final    Comment: (NOTE) The Xpert Xpress SARS-CoV-2/FLU/RSV plus assay is intended as an aid in the diagnosis of influenza from Nasopharyngeal swab specimens and should not be used as a sole basis for treatment. Nasal washings and aspirates are unacceptable for  Xpert Xpress SARS-CoV-2/FLU/RSV testing.  Fact Sheet for Patients: EntrepreneurPulse.com.au  Fact Sheet for Healthcare Providers: IncredibleEmployment.be  This test is not yet approved or cleared by the [place] FDA and has been authorized for detection and/or diagnosis of SARS-CoV-2 by FDA under an Emergency Use Authorization (EUA). This EUA will remain in effect (meaning this test can be used) for the duration of the COVID-19 declaration under Section 564(b)(1) of the Act, 21 U.S.C. section 360bbb-3(b)(1), unless the authorization is terminated or revoked.  Performed at [facility], [address] [place], [place] [address]   MRSA Next Gen by PCR, Nasal     Status: None   Collection Time: 09/18/20  8:48 PM   Specimen: Nasal Mucosa; Nasal Swab  Result Value Ref Range Status   MRSA by PCR Next Gen NOT DETECTED NOT DETECTED Final    Comment: (NOTE) The GeneXpert MRSA Assay (FDA approved for NASAL specimens only), is one component of a comprehensive MRSA colonization surveillance program. It is not intended to diagnose MRSA infection nor to guide or monitor treatment for MRSA infections. Test performance is not FDA approved in patients less than 65 years old. Performed at [facility], [address], [place], [place] [address]   C Difficile Quick Screen w PCR reflex     Status: None   Collection Time: 09/19/20  6:41 PM   Specimen: STOOL  Result Value Ref Range Status   C Diff antigen NEGATIVE NEGATIVE Final   C Diff toxin NEGATIVE NEGATIVE Final   C Diff interpretation No C. difficile detected.  Final    Comment: Performed at [facility], [address] [place], [place] [address]  Resp Panel by RT-PCR (Flu A&B, Covid) Nasopharyngeal Swab     Status: None   Collection Time: 09/23/20 12:03 PM   Specimen: Nasopharyngeal Swab; Nasopharyngeal(NP) swabs in vial transport medium  Result Value Ref Range Status   SARS Coronavirus 2 by RT PCR  NEGATIVE NEGATIVE Final    Comment: (NOTE) SARS-CoV-2 target nucleic acids are NOT DETECTED.  The SARS-CoV-2 RNA is generally detectable in upper respiratory specimens during the acute phase of infection. The lowest concentration of SARS-CoV-2 viral copies this assay can detect is 138 copies/mL. A negative result does not preclude SARS-Cov-2 infection and should not be used as the sole basis for treatment or other patient management decisions. A negative result may occur with  improper specimen collection/handling, submission of specimen other than nasopharyngeal swab, presence of viral mutation(s) within the areas targeted by this assay, and inadequate number of viral copies(<138 copies/mL). A negative result must be combined with clinical observations, patient history, and epidemiological information. The expected result is Negative.  Fact Sheet for Patients:  EntrepreneurPulse.com.au  Fact Sheet for Healthcare Providers:  IncredibleEmployment.be  This test is no t yet approved or cleared by the [place] FDA and  has been authorized for detection and/or diagnosis of SARS-CoV-2 by FDA under an Emergency Use Authorization (EUA). This EUA will remain  in effect (meaning this test can be used) for the duration of the COVID-19 declaration under Section 564(b)(1) of the Act, 21 U.S.C.section 360bbb-3(b)(1), unless the authorization is terminated  or revoked sooner.  Influenza A by PCR NEGATIVE NEGATIVE Final   Influenza B by PCR NEGATIVE NEGATIVE Final    Comment: (NOTE) The Xpert Xpress SARS-CoV-2/FLU/RSV plus assay is intended as an aid in the diagnosis of influenza from Nasopharyngeal swab specimens and should not be used as a sole basis for treatment. Nasal washings and aspirates are unacceptable for Xpert Xpress SARS-CoV-2/FLU/RSV testing.  Fact Sheet for Patients: EntrepreneurPulse.com.au  Fact Sheet for  Healthcare Providers: IncredibleEmployment.be  This test is not yet approved or cleared by the [place] FDA and has been authorized for detection and/or diagnosis of SARS-CoV-2 by FDA under an Emergency Use Authorization (EUA). This EUA will remain in effect (meaning this test can be used) for the duration of the COVID-19 declaration under Section 564(b)(1) of the Act, 21 U.S.C. section 360bbb-3(b)(1), unless the authorization is terminated or revoked.  Performed at [facility], [address] [place], [place] [address]      Labs: BNP (last 3 results) No results for input(s): BNP in the last 8760 hours. Basic Metabolic Panel: Recent Labs  Lab 09/19/20 1810 09/20/20 0407 09/21/20 0442 09/22/20 0610 09/23/20 0143 09/24/20 0317  NA 124* 128* 121* 123* 121* 119*  K 3.6 2.9* 3.3* 3.1* 3.3* 4.0  CL 89* 89* 85* 89* 88* 86*  CO2 18* '26 25 24 24 '$ 21*  GLUCOSE 144* 37* 160* 131* 115* 72  BUN 34* '12 13 9 14 20  '$ CREATININE 6.44* 2.73* 3.51* 2.78* 3.50* 4.12*  4.14*  CALCIUM 8.4* 8.4* 7.9* 7.8* 7.9* 7.9*  MG 1.7 1.9 1.4* 1.8 2.1  --   PHOS 2.7 1.4* 2.2* 1.5* 1.7* 1.9*   Liver Function Tests: Recent Labs  Lab 09/17/20 1505 09/18/20 0411 09/18/20 2205 09/20/20 0407 09/21/20 0442 09/22/20 0610 09/23/20 0143 09/24/20 0317  AST 39 24  --   --   --   --   --   --   ALT 21 16  --   --   --   --   --   --   ALKPHOS 189* 147*  --   --   --   --   --   --   BILITOT 1.2 1.1  --   --   --   --   --   --   PROT 8.2* 6.5  --   --   --   --   --   --   ALBUMIN 4.2 3.5   < > 3.6 3.3* 3.1* 3.4* 3.3*   < > = values in this interval not displayed.   No results for input(s): LIPASE, AMYLASE in the last 168 hours. No results for input(s): AMMONIA in the last 168 hours. CBC: Recent Labs  Lab 09/17/20 1505 09/18/20 0411 09/19/20 1810 09/20/20 0407 09/21/20 0442 09/23/20 0143 09/24/20 0317  WBC 9.2   < > 6.9 6.2 4.5 4.2 4.4  NEUTROABS 5.9  --   --   --   --    --   --   HGB 13.1   < > 10.1* 10.1* 9.6* 10.2* 10.1*  HCT 44.0   < > 31.8* 30.8* 28.8* 30.3* 29.0*  MCV 119.9*   < > 110.0* 106.9* 106.3* 104.5* 101.8*  PLT 266   < > 141* 121* 95* 96* 110*   < > = values in this interval not displayed.   Cardiac Enzymes: No results for input(s): CKTOTAL, CKMB, CKMBINDEX, TROPONINI in the last 168 hours. BNP: Invalid input(s): POCBNP CBG: Recent Labs  Lab  09/23/20 1608 09/23/20 2035 09/24/20 0334 09/24/20 0600 09/24/20 0731  GLUCAP 82 73 88 72 76   D-Dimer No results for input(s): DDIMER in the last 72 hours. Hgb A1c No results for input(s): HGBA1C in the last 72 hours. Lipid Profile No results for input(s): CHOL, HDL, LDLCALC, TRIG, CHOLHDL, LDLDIRECT in the last 72 hours. Thyroid function studies No results for input(s): TSH, T4TOTAL, T3FREE, THYROIDAB in the last 72 hours.  Invalid input(s): FREET3 Anemia work up No results for input(s): VITAMINB12, FOLATE, FERRITIN, TIBC, IRON, RETICCTPCT in the last 72 hours. Urinalysis    Component Value Date/Time   COLORURINE YELLOW 09/27/2019 1310   APPEARANCEUR TURBID (A) 09/27/2019 1310   LABSPEC 1.013 09/27/2019 1310   PHURINE 7.0 09/27/2019 1310   GLUCOSEU 50 (A) 09/27/2019 1310   HGBUR SMALL (A) 09/27/2019 1310   BILIRUBINUR NEGATIVE 09/27/2019 1310   KETONESUR NEGATIVE 09/27/2019 1310   PROTEINUR >=300 (A) 09/27/2019 1310   UROBILINOGEN 0.2 07/25/2014 2309   NITRITE NEGATIVE 09/27/2019 1310   LEUKOCYTESUR MODERATE (A) 09/27/2019 1310   Sepsis Labs Invalid input(s): PROCALCITONIN,  WBC,  LACTICIDVEN Microbiology Recent Results (from the past 240 hour(s))  Resp Panel by RT-PCR (Flu A&B, Covid) Nasopharyngeal Swab     Status: None   Collection Time: 09/17/20  6:01 PM   Specimen: Nasopharyngeal Swab; Nasopharyngeal(NP) swabs in vial transport medium  Result Value Ref Range Status   SARS Coronavirus 2 by RT PCR NEGATIVE NEGATIVE Final    Comment: (NOTE) SARS-CoV-2 target nucleic  acids are NOT DETECTED.  The SARS-CoV-2 RNA is generally detectable in upper respiratory specimens during the acute phase of infection. The lowest concentration of SARS-CoV-2 viral copies this assay can detect is 138 copies/mL. A negative result does not preclude SARS-Cov-2 infection and should not be used as the sole basis for treatment or other patient management decisions. A negative result may occur with  improper specimen collection/handling, submission of specimen other than nasopharyngeal swab, presence of viral mutation(s) within the areas targeted by this assay, and inadequate number of viral copies(<138 copies/mL). A negative result must be combined with clinical observations, patient history, and epidemiological information. The expected result is Negative.  Fact Sheet for Patients:  EntrepreneurPulse.com.au  Fact Sheet for Healthcare Providers:  IncredibleEmployment.be  This test is no t yet approved or cleared by the [place] FDA and  has been authorized for detection and/or diagnosis of SARS-CoV-2 by FDA under an Emergency Use Authorization (EUA). This EUA will remain  in effect (meaning this test can be used) for the duration of the COVID-19 declaration under Section 564(b)(1) of the Act, 21 U.S.C.section 360bbb-3(b)(1), unless the authorization is terminated  or revoked sooner.       Influenza A by PCR NEGATIVE NEGATIVE Final   Influenza B by PCR NEGATIVE NEGATIVE Final    Comment: (NOTE) The Xpert Xpress SARS-CoV-2/FLU/RSV plus assay is intended as an aid in the diagnosis of influenza from Nasopharyngeal swab specimens and should not be used as a sole basis for treatment. Nasal washings and aspirates are unacceptable for Xpert Xpress SARS-CoV-2/FLU/RSV testing.  Fact Sheet for Patients: EntrepreneurPulse.com.au  Fact Sheet for Healthcare Providers: IncredibleEmployment.be  This  test is not yet approved or cleared by the [place] FDA and has been authorized for detection and/or diagnosis of SARS-CoV-2 by FDA under an Emergency Use Authorization (EUA). This EUA will remain in effect (meaning this test can be used) for the duration of the COVID-19 declaration under Section 564(b)(1) of the  Act, 21 U.S.C. section 360bbb-3(b)(1), unless the authorization is terminated or revoked.  Performed at [facility], [address], [place], [place] [address]   MRSA Next Gen by PCR, Nasal     Status: None   Collection Time: 09/18/20  8:48 PM   Specimen: Nasal Mucosa; Nasal Swab  Result Value Ref Range Status   MRSA by PCR Next Gen NOT DETECTED NOT DETECTED Final    Comment: (NOTE) The GeneXpert MRSA Assay (FDA approved for NASAL specimens only), is one component of a comprehensive MRSA colonization surveillance program. It is not intended to diagnose MRSA infection nor to guide or monitor treatment for MRSA infections. Test performance is not FDA approved in patients less than 68 years old. Performed at [facility], [address] [place], [place] [address]   C Difficile Quick Screen w PCR reflex     Status: None   Collection Time: 09/19/20  6:41 PM   Specimen: STOOL  Result Value Ref Range Status   C Diff antigen NEGATIVE NEGATIVE Final   C Diff toxin NEGATIVE NEGATIVE Final   C Diff interpretation No C. difficile detected.  Final    Comment: Performed at [facility], [address], [place], [place] [address]  Resp Panel by RT-PCR (Flu A&B, Covid) Nasopharyngeal Swab     Status: None   Collection Time: 09/23/20 12:03 PM   Specimen: Nasopharyngeal Swab; Nasopharyngeal(NP) swabs in vial transport medium  Result Value Ref Range Status   SARS Coronavirus 2 by RT PCR NEGATIVE NEGATIVE Final    Comment: (NOTE) SARS-CoV-2 target nucleic acids are NOT DETECTED.  The SARS-CoV-2 RNA is generally detectable in upper respiratory specimens during the acute phase of  infection. The lowest concentration of SARS-CoV-2 viral copies this assay can detect is 138 copies/mL. A negative result does not preclude SARS-Cov-2 infection and should not be used as the sole basis for treatment or other patient management decisions. A negative result may occur with  improper specimen collection/handling, submission of specimen other than nasopharyngeal swab, presence of viral mutation(s) within the areas targeted by this assay, and inadequate number of viral copies(<138 copies/mL). A negative result must be combined with clinical observations, patient history, and epidemiological information. The expected result is Negative.  Fact Sheet for Patients:  EntrepreneurPulse.com.au  Fact Sheet for Healthcare Providers:  IncredibleEmployment.be  This test is no t yet approved or cleared by the [place] FDA and  has been authorized for detection and/or diagnosis of SARS-CoV-2 by FDA under an Emergency Use Authorization (EUA). This EUA will remain  in effect (meaning this test can be used) for the duration of the COVID-19 declaration under Section 564(b)(1) of the Act, 21 U.S.C.section 360bbb-3(b)(1), unless the authorization is terminated  or revoked sooner.       Influenza A by PCR NEGATIVE NEGATIVE Final   Influenza B by PCR NEGATIVE NEGATIVE Final    Comment: (NOTE) The Xpert Xpress SARS-CoV-2/FLU/RSV plus assay is intended as an aid in the diagnosis of influenza from Nasopharyngeal swab specimens and should not be used as a sole basis for treatment. Nasal washings and aspirates are unacceptable for Xpert Xpress SARS-CoV-2/FLU/RSV testing.  Fact Sheet for Patients: EntrepreneurPulse.com.au  Fact Sheet for Healthcare Providers: IncredibleEmployment.be  This test is not yet approved or cleared by the [place] FDA and has been authorized for detection and/or diagnosis of SARS-CoV-2  by FDA under an Emergency Use Authorization (EUA). This EUA will remain in effect (meaning this test can be used) for  the duration of the COVID-19 declaration under Section 564(b)(1) of the Act, 21 U.S.C. section 360bbb-3(b)(1), unless the authorization is terminated or revoked.  Performed at [facility], [address], [place], [place] [address]    Time coordinating discharge: 38 mins   SIGNED:  Irwin Brakeman, MD  [facility] 09/24/2020, 1:21 PM How to contact the [facility] Attending or Consulting provider [place] or covering provider during after hours [place], for this patient?  Check the care team in [facility] and look for a) attending/consulting [facility] provider listed and b) the [facility] team listed Log into [email or web address] and use [place]'s universal password to access. If you do not have the password, please contact the hospital operator. Locate the [facility] provider you are looking for under [facility] and page to a number that you can be directly reached. If you still have difficulty reaching the provider, please page the [facility] (Director on Call) for the Hospitalists listed on amion for assistance.

## 2020-09-24 NOTE — Progress Notes (Signed)
Patient discharged to [facility] facility. Report called and given to [facility] LPN. VSS. Transported by [facility] to awaiting facility.

## 2020-09-24 NOTE — Procedures (Signed)
I was present at this dialysis session. I have reviewed the session itself and made appropriate changes.   Vital signs in last 24 hours:  Temp:  [97.2 F (36.2 C)-97.9 F (36.6 C)] 97.2 F (36.2 C) (08/09 0602) Pulse Rate:  [64-76] 76 (08/09 0900) Resp:  [17-18] 17 (08/09 0602) BP: (149-171)/(64-117) 149/117 (08/09 0900) SpO2:  [100 %] 100 % (08/09 0602) Weight change:  Filed Weights   09/19/20 0203 09/19/20 2215 09/21/20 1600  Weight: 43.6 kg 43.8 kg 49.9 kg    Recent Labs  Lab 09/24/20 0317  NA 119*  K 4.0  CL 86*  CO2 21*  GLUCOSE 72  BUN 20  CREATININE 4.12*  4.14*  CALCIUM 7.9*  PHOS 1.9*    Recent Labs  Lab 09/17/20 1505 09/18/20 0411 09/21/20 0442 09/23/20 0143 09/24/20 0317  WBC 9.2   < > 4.5 4.2 4.4  NEUTROABS 5.9  --   --   --   --   HGB 13.1   < > 9.6* 10.2* 10.1*  HCT 44.0   < > 28.8* 30.3* 29.0*  MCV 119.9*   < > 106.3* 104.5* 101.8*  PLT 266   < > 95* 96* 110*   < > = values in this interval not displayed.    Scheduled Meds:  calcitRIOL  0.25 mcg Oral Q T,Th,Sa-HD   carvedilol  12.5 mg Oral BID WC   Chlorhexidine Gluconate Cloth  6 each Topical Q0600   cinacalcet  30 mg Oral QPM   darbepoetin (ARANESP) injection - DIALYSIS  40 mcg Intravenous Q Tue-HD   enoxaparin (LOVENOX) injection  30 mg Subcutaneous Q24H   ferric citrate  420 mg Oral TID WC   folic acid  1 mg Oral q morning   hydrALAZINE  50 mg Oral Q8H   insulin aspart  0-6 Units Subcutaneous TID WC   insulin glargine-yfgn  1 Units Subcutaneous Daily   levETIRAcetam  1,000 mg Oral q morning   pantoprazole  40 mg Oral Daily   sertraline  50 mg Oral Daily   sodium chloride flush  10-40 mL Intracatheter Q12H   Continuous Infusions:  sodium chloride     sodium chloride     PRN Meds:.sodium chloride, sodium chloride, albuterol, alteplase, dextrose, dextrose, heparin, lidocaine (PF), lidocaine-prilocaine, oxymetazoline, pentafluoroprop-tetrafluoroeth, sodium chloride flush   Donetta Potts,  MD 09/24/2020, 10:52 AM

## 2020-09-24 NOTE — Discharge Instructions (Addendum)
Please resume regular hemodialysis schedule.  Please check renal function panel in 1 week to follow up sodium levels.   Please monitor CBG 4-5 times per day  PLEASE DO NOT GIVE PATIENT METFORMIN     IMPORTANT INFORMATION: PAY CLOSE ATTENTION   PHYSICIAN DISCHARGE INSTRUCTIONS  Follow with Primary care provider  [name], NP  and other consultants as instructed by your Hospitalist Physician  [place], WORSEN OR NEW PROBLEM DEVELOPS   Please note: You were cared for by a hospitalist during your hospital stay. Every effort will be made to forward records to your primary care provider.  You can request that your primary care provider send for your hospital records if they have not received them.  Once you are discharged, your primary care physician will handle any further medical issues. Please note that NO REFILLS for any discharge medications will be authorized once you are discharged, as it is imperative that you return to your primary care physician (or establish a relationship with a primary care physician if you do not have one) for your post hospital discharge needs so that they can reassess your need for medications and monitor your lab values.  Please get a complete blood count and chemistry panel checked by your Primary MD at your next visit, and again as instructed by your Primary MD.  Get Medicines reviewed and adjusted: Please take all your medications with you for your next visit with your Primary MD  Laboratory/radiological data: Please request your Primary MD to go over all hospital tests and procedure/radiological results at the follow up, please ask your primary care provider to get all Hospital records sent to his/her office.  In some cases, they will be blood work, cultures and biopsy results pending at the time of your discharge. Please request that your primary care provider follow up on these  results.  If you are diabetic, please bring your blood sugar readings with you to your follow up appointment with primary care.    Please call and make your follow up appointments as soon as possible.    Also Note the following: If you experience worsening of your admission symptoms, develop shortness of breath, life threatening emergency, suicidal or homicidal thoughts you must seek medical attention immediately by calling 911 or calling your MD immediately  if symptoms less severe.  You must read complete instructions/literature along with all the possible adverse reactions/side effects for all the Medicines you take and that have been prescribed to you. Take any new Medicines after you have completely understood and accpet all the possible adverse reactions/side effects.   Do not drive when taking Pain medications or sleeping medications (Benzodiazepines)  Do not take more than prescribed Pain, Sleep and Anxiety Medications. It is not advisable to combine anxiety,sleep and pain medications without talking with your primary care practitioner  Special Instructions: If you have smoked or chewed Tobacco  in the last 2 yrs please stop smoking, stop any regular Alcohol  and or any Recreational drug use.  Wear Seat belts while driving.  Do not drive if taking any narcotic, mind altering or controlled substances or recreational drugs or alcohol.

## 2020-09-24 NOTE — Progress Notes (Signed)
   09/24/20 0541 Provider Notification Provider Name/Title Dr. Clearence Ped Date Provider Notified 09/24/20 Time Provider Notified [phone] Notification Type Page Notification Reason Critical result Test performed and critical result sodium 119 Date Critical Result Received 09/24/20 Time Critical Result Received [phone]

## 2020-10-03 ENCOUNTER — Emergency Department (HOSPITAL_COMMUNITY)
Admission: EM | Admit: 2020-10-03 | Discharge: 2020-10-03 | Disposition: A | Discharge status: Home / Self Care | Payer: Medicare Other | Attending: Emergency Medicine | Admitting: Emergency Medicine

## 2020-10-03 ENCOUNTER — Encounter (HOSPITAL_COMMUNITY): Payer: Self-pay | Admitting: Emergency Medicine

## 2020-10-03 ENCOUNTER — Other Ambulatory Visit: Payer: Self-pay

## 2020-10-03 DIAGNOSIS — I132 Hypertensive heart and chronic kidney disease with heart failure and with stage 5 chronic kidney disease, or end stage renal disease: Secondary | ICD-10-CM | POA: Diagnosis not present

## 2020-10-03 DIAGNOSIS — Z794 Long term (current) use of insulin: Secondary | ICD-10-CM | POA: Diagnosis not present

## 2020-10-03 DIAGNOSIS — E1140 Type 2 diabetes mellitus with diabetic neuropathy, unspecified: Secondary | ICD-10-CM | POA: Insufficient documentation

## 2020-10-03 DIAGNOSIS — R410 Disorientation, unspecified: Secondary | ICD-10-CM | POA: Insufficient documentation

## 2020-10-03 DIAGNOSIS — R4182 Altered mental status, unspecified: Secondary | ICD-10-CM | POA: Diagnosis present

## 2020-10-03 DIAGNOSIS — E1110 Type 2 diabetes mellitus with ketoacidosis without coma: Secondary | ICD-10-CM | POA: Insufficient documentation

## 2020-10-03 DIAGNOSIS — E1122 Type 2 diabetes mellitus with diabetic chronic kidney disease: Secondary | ICD-10-CM | POA: Insufficient documentation

## 2020-10-03 DIAGNOSIS — I5032 Chronic diastolic (congestive) heart failure: Secondary | ICD-10-CM | POA: Insufficient documentation

## 2020-10-03 DIAGNOSIS — Z79899 Other long term (current) drug therapy: Secondary | ICD-10-CM | POA: Diagnosis not present

## 2020-10-03 DIAGNOSIS — I2510 Atherosclerotic heart disease of native coronary artery without angina pectoris: Secondary | ICD-10-CM | POA: Insufficient documentation

## 2020-10-03 DIAGNOSIS — N186 End stage renal disease: Secondary | ICD-10-CM | POA: Diagnosis not present

## 2020-10-03 DIAGNOSIS — F17200 Nicotine dependence, unspecified, uncomplicated: Secondary | ICD-10-CM | POA: Insufficient documentation

## 2020-10-03 DIAGNOSIS — Z992 Dependence on renal dialysis: Secondary | ICD-10-CM | POA: Diagnosis not present

## 2020-10-03 LAB — BASIC METABOLIC PANEL
Anion gap: 8 (ref 5–15)
BUN: 37 mg/dL — ABNORMAL HIGH (ref 8–23)
CO2: 24 mmol/L (ref 22–32)
Calcium: 9.7 mg/dL (ref 8.9–10.3)
Chloride: 99 mmol/L (ref 98–111)
Creatinine, Ser: 4.97 mg/dL — ABNORMAL HIGH (ref 0.44–1.00)
GFR, Estimated: 9 mL/min — ABNORMAL LOW (ref >60)
Glucose, Bld: 201 mg/dL — ABNORMAL HIGH (ref 70–99)
Potassium: 3.8 mmol/L (ref 3.5–5.1)
Sodium: 131 mmol/L — ABNORMAL LOW (ref 135–145)

## 2020-10-03 LAB — CBC WITH DIFFERENTIAL/PLATELET
Abs Immature Granulocytes: 0.03 10*3/uL (ref 0.00–0.07)
Basophils Absolute: 0 10*3/uL (ref 0.0–0.1)
Basophils Relative: 0 %
Eosinophils Absolute: 0.1 10*3/uL (ref 0.0–0.5)
Eosinophils Relative: 1 %
HCT: 31.7 % — ABNORMAL LOW (ref 36.0–46.0)
Hemoglobin: 10.3 g/dL — ABNORMAL LOW (ref 12.0–15.0)
Immature Granulocytes: 0 %
Lymphocytes Relative: 15 %
Lymphs Abs: 1.1 10*3/uL (ref 0.7–4.0)
MCH: 35.4 pg — ABNORMAL HIGH (ref 26.0–34.0)
MCHC: 32.5 g/dL (ref 30.0–36.0)
MCV: 108.9 fL — ABNORMAL HIGH (ref 80.0–100.0)
Monocytes Absolute: 0.7 10*3/uL (ref 0.1–1.0)
Monocytes Relative: 10 %
Neutro Abs: 5.6 10*3/uL (ref 1.7–7.7)
Neutrophils Relative %: 74 %
Platelets: 122 10*3/uL — ABNORMAL LOW (ref 150–400)
RBC: 2.91 MIL/uL — ABNORMAL LOW (ref 3.87–5.11)
RDW: 14.6 % (ref 11.5–15.5)
WBC: 7.5 10*3/uL (ref 4.0–10.5)
nRBC: 0 % (ref 0.0–0.2)

## 2020-10-03 LAB — CBG MONITORING, ED: Glucose-Capillary: 184 mg/dL — ABNORMAL HIGH (ref 70–99)

## 2020-10-03 NOTE — ED Provider Notes (Signed)
[facility] Provider Note   CSN: [id number] Arrival date & time: 10/03/20  1039     History No chief complaint on file.   Erica [name] is a 65 y.o. female.  MorningPatient presents ER chief complaint of altered mental status missing dialysis today.  Reportedly went to dialysis but they found that she was difficult to arouse and sent her to the ER.  Upon arrival here the patient does not appear to have any complaints.  She is currently awake although she is sleepy.  She states that she is not sure why she feels so sleepy.  She denies any headache or chest pain abdominal pain.  Ports no fevers or cough or vomiting.      Past Medical History:  Diagnosis Date   Alcohol-induced pancreatitis    CAD (coronary artery disease) 09/17/2020   Chronic diarrhea    Depression    Diabetes mellitus    fasting blood sugar 110-120s   Dialysis patient ([facility])    Diastolic CHF ([place])    DKA (diabetic ketoacidoses)    Gastroparesis    GERD (gastroesophageal reflux disease)    Heart murmur    History of kidney stones    Hyperlipidemia    Hypertension    Hypokalemia    Muscle spasm    Neuropathic pain    Neuropathy    Hx: of   Pyelonephritis    Seizures ([place])    Vitamin B12 deficiency    Vitamin D deficiency     Patient Active Problem List   Diagnosis Date Noted   CAD (coronary artery disease) 09/17/2020   Altered mental status    Acute metabolic encephalopathy 123456   Diarrhea    Pressure injury of skin 06/09/2020   Sacral fracture ([place]) 06/08/2020   Thrombocytopenia ([place]) 06/08/2020   Hypertensive urgency 06/08/2020   (HFpEF) heart failure with preserved ejection fraction ([place]) 06/08/2020   Fall 06/08/2020   Elevated MCV 06/08/2020   Palliative care by specialist    Generalized weakness 11/14/2019   HCAP (healthcare-associated pneumonia) 11/14/2019   Hyperkalemia 11/13/2019   Hyponatremia 09/27/2019   Closed fracture of right proximal humerus  04/20/2019   Surgery, elective    Seizure ([place]) 11/12/2018   History of hydronephrosis --stents in place 12/08/2017   Stroke ([place]) 11/29/2017   Seizures ([place])    Hydronephrosis    Recurrent UTI    Diabetes mellitus type 2 in nonobese (HCC)    ESRD (end stage renal disease) ([place])    Anemia of chronic disease    Chronic diastolic congestive heart failure ([place])    Essential hypertension    PICC (peripherally inserted central catheter) in place    Acute pyelonephritis 11/12/2017   Uncontrolled type 2 diabetes mellitus with hyperglycemia, with long-term current use of insulin ([place]) 11/10/2017   Renal failure 11/08/2017   Seizure disorder ([place]) 11/08/2017   Orthostatic hypotension 07/25/2014   Moderate protein malnutrition ([place]) 09/15/2013   Aspiration pneumonia ([place]) 09/15/2013   Acute respiratory failure requiring reintubation ([place]) 09/11/2013   probable Seizures due to metabolic disorder [id number]   Lactic acidosis 03/19/2013   Abdominal pain 03/19/2013   Rotavirus infection 10/29/2012   Type II or unspecified type diabetes mellitus without mention of complication, uncontrolled 10/29/2012   Protein-calorie malnutrition, severe ([place]) 10/27/2012   NSTEMI (non-ST elevated myocardial infarction) ([place]) 10/26/2012   Fever, unspecified 10/26/2012   Hypotension XX123456   Metabolic acidosis XX123456   Chronic diarrhea 10/25/2012   Tobacco abuse 10/25/2012  DKA (diabetic ketoacidoses) 09/09/2012   Dehydration 09/09/2012   DKA, type 2 ([place]) 05/20/2012   Abnormal LFTs 05/20/2012   Heart murmur, systolic [id number]   Hypoglycemia XX123456   Metabolic encephalopathy XX123456   Alcohol abuse 07/08/2011   Hypokalemia 07/08/2011   Nausea & vomiting 07/08/2011   H/O chronic pancreatitis 07/08/2011    Past Surgical History:  Procedure Laterality Date   AV FISTULA PLACEMENT Left 12/15/2017   Procedure: INSERTION OF ARTERIOVENOUS (AV) GORE-TEX GRAFT ARM;  Surgeon: Rosetta Posner,  MD;  Location: [facility];  Service: Vascular;  Laterality: Left;   CATARACT EXTRACTION W/PHACO Right 03/14/2015   Procedure: CATARACT EXTRACTION PHACO AND INTRAOCULAR LENS PLACEMENT ([place]);  Surgeon: Baruch Goldmann, MD;  Location: [facility];  Service: Ophthalmology;  Laterality: Right;  CDE:11.13   CATARACT EXTRACTION W/PHACO Left 04/11/2015   Procedure: CATARACT EXTRACTION PHACO AND INTRAOCULAR LENS PLACEMENT LEFT EYE CDE=9.68;  Surgeon: Baruch Goldmann, MD;  Location: [facility];  Service: Ophthalmology;  Laterality: Left;   COLONOSCOPY  02/24/2010   cystoscopy with ureteral stent  Bilateral 10/07/2017   At [facility] CV LINE RIGHT  11/17/2017   IR FLUORO GUIDE CV LINE RIGHT  11/22/2017   IR REMOVAL TUN CV CATH W/O FL  01/26/2018   IR US GUIDE VASC ACCESS RIGHT  11/17/2017   MULTIPLE EXTRACTIONS WITH ALVEOLOPLASTY N/A 06/13/2012   Procedure: MULTIPLE EXTRACION WITH ALVEOLOPLASTY EXTRACT: 18, 19, 20, 21, 22, 24, 25, 27, 28, 29, 30, 31;  Surgeon: Gae Bon, DDS;  Location: [facility];  Service: Oral Surgery;  Laterality: N/A;   ORIF HUMERUS FRACTURE Right 04/20/2019   Procedure: OPEN REDUCTION INTERNAL FIXATION (ORIF) PROXIMAL HUMERUS FRACTURE;  Surgeon: Marchia Bond, MD;  Location: [facility];  Service: Orthopedics;  Laterality: Right;   TUBAL LIGATION     URETERAL STENT PLACEMENT  09/2017     OB History     Gravida      Para      Term      Preterm      AB      Living  2      SAB      IAB      Ectopic      Multiple      Live Births              Family History  Problem Relation Age of Onset   Diabetes Sister    Chronic Renal Failure Neg Hx     Social History   Tobacco Use   Smoking status: Never   Smokeless tobacco: Current    Types: Snuff  Vaping Use   Vaping Use: Never used  Substance Use Topics   Alcohol use: Not Currently    Alcohol/week: 1.0 standard drink    Types: 1 Cans of beer per week   Drug use: No    Home Medications Prior to  Admission medications   Medication Sig Start Date End Date Taking? Authorizing Provider  acetaminophen (TYLENOL) 325 MG tablet Take 650 mg by mouth every 4 (four) hours as needed for moderate pain.   Yes [provider]  albuterol (VENTOLIN HFA) 108 (90 Base) MCG/ACT inhaler Inhale 2 puffs into the lungs every 4 (four) hours as needed for wheezing or shortness of breath.    Yes [provider]  calcitRIOL (ROCALTROL) 0.25 MCG capsule Take 1 capsule (0.25 mcg total) by mouth Every Tuesday,Thursday,and Saturday with dialysis. 12/18/17  Yes Love, Olin Hauser  S, PA-C  carvedilol (COREG) 12.5 MG tablet Take 1 tablet (12.5 mg total) by mouth 2 (two) times daily. 09/24/20  Yes Johnson, Clanford L, MD  cinacalcet (SENSIPAR) 30 MG tablet Take 30 mg by mouth every evening.   Yes [provider]  ferric citrate (AURYXIA) 1 GM 210 MG(Fe) tablet Take 2 tablets (420 mg total) by mouth 3 (three) times daily with meals. 11/15/18  Yes Buriev, Arie Sabina, MD  folic acid (FOLVITE) 1 MG tablet Take 1 mg by mouth every morning.   Yes [provider]  hydrALAZINE (APRESOLINE) 25 MG tablet Take 2 tablets (50 mg total) by mouth every 8 (eight) hours. 09/24/20  Yes Johnson, Clanford L, MD  insulin glargine-yfgn (SEMGLEE) 100 UNIT/ML injection Inject 0.01 mLs (1 Units total) into the skin daily. 09/24/20  Yes Johnson, Clanford L, MD  insulin lispro (HUMALOG) 100 UNIT/ML injection Inject 1-3 Units into the skin in the morning, at noon, in the evening, and at bedtime. Per sliding scale   Yes [provider]  levETIRAcetam (KEPPRA) 1000 MG tablet Take 1,000 mg by mouth every morning.   Yes [provider]  loperamide (IMODIUM) 2 MG capsule Take 4 mg by mouth daily as needed for diarrhea or loose stools.   Yes [provider]  pantoprazole (PROTONIX) 40 MG tablet Take 1 tablet (40 mg total) by mouth daily. 11/19/19 10/26/20 Yes Oswald Hillock, MD  promethazine (PHENERGAN) 25 MG tablet  Take 25 mg by mouth every 8 (eight) hours as needed for nausea or vomiting. 07/16/20  Yes [provider]    Allergies    Aspirin  Review of Systems   Review of Systems  Constitutional:  Negative for fever.  HENT:  Negative for ear pain.   Eyes:  Negative for pain.  Respiratory:  Negative for cough.   Cardiovascular:  Negative for chest pain.  Gastrointestinal:  Negative for abdominal pain.  Genitourinary:  Negative for flank pain.  Musculoskeletal:  Negative for back pain.  Skin:  Negative for rash.  Neurological:  Negative for headaches.   Physical Exam Updated Vital Signs BP (!) 176/87   Pulse 73   Temp 97.8 F (36.6 C) (Oral)   Resp 19   Ht 5' (1.524 m)   Wt 46.1 kg   SpO2 99%   BMI 19.85 kg/m   Physical Exam Constitutional:      General: She is not in acute distress.    Appearance: Normal appearance.  HENT:     Head: Normocephalic.     Nose: Nose normal.  Eyes:     Extraocular Movements: Extraocular movements intact.  Cardiovascular:     Rate and Rhythm: Normal rate.  Pulmonary:     Effort: Pulmonary effort is normal.  Musculoskeletal:        General: Normal range of motion.     Cervical back: Normal range of motion.  Neurological:     Mental Status: She is alert.     Comments: Is awake, sleepy but wakes up and will answer questions.  Appears to be moving all extremities.  No focal neurodeficit noted.    ED Results / Procedures / Treatments   Labs (all labs ordered are listed, but only abnormal results are displayed) Labs Reviewed  CBC WITH DIFFERENTIAL/PLATELET - Abnormal; Notable for the following components:      Result Value   RBC 2.91 (*)    Hemoglobin 10.3 (*)    HCT 31.7 (*)    MCV 108.9 (*)  MCH 35.4 (*)    Platelets 122 (*)    All other components within normal limits  BASIC METABOLIC PANEL - Abnormal; Notable for the following components:   Sodium 131 (*)    Glucose, Bld 201 (*)    BUN 37 (*)    Creatinine, Ser 4.97 (*)     GFR, Estimated 9 (*)    All other components within normal limits  CBG MONITORING, ED - Abnormal; Notable for the following components:   Glucose-Capillary 184 (*)    All other components within normal limits    EKG None  Radiology No results found.  Procedures Procedures   Medications Ordered in ED Medications - No data to display  ED Course  I have reviewed the triage vital signs and the nursing notes.  Pertinent labs & imaging results that were available during my care of the patient were reviewed by me and considered in my medical decision making (see chart for details).    MDM Rules/Calculators/A&P                           Reviewed patient's records show multiple visits for altered mental status with unclear cause.  Patient appears to have presented again for similar reasons.  Etiology of her periodic episodes is unclear.  However she is awake alert, answers all my questions, can tell me the history of where she lives and her dialysis history.  She will be discharged back to her facility to follow-up with dialysis.  Labs see otherwise unremarkable.  Vital signs are normal persistently tachycardic consistent with her history of dialysis dependency.   Final Clinical Impression(s) / ED Diagnoses Final diagnoses:  Confusion    Rx / DC Orders ED Discharge Orders     None        Luna Fuse, MD 10/03/20 1418

## 2020-10-03 NOTE — ED Notes (Addendum)
Pt discharged via EMS back to [facility]. Attempted to call report to [phone]. No staff answered the phone no VM available to leave message.

## 2020-10-03 NOTE — ED Triage Notes (Signed)
Pt from [facility] via EMS for altered mental status. Due for dialysis today but unable to be dialyzed d/t AMS and difficult to stay awake. Responsive to voice but falls asleep quickly.

## 2020-11-25 ENCOUNTER — Ambulatory Visit (INDEPENDENT_AMBULATORY_CARE_PROVIDER_SITE_OTHER): Payer: Medicare Other | Admitting: "Endocrinology

## 2020-11-25 ENCOUNTER — Other Ambulatory Visit: Payer: Self-pay

## 2020-11-25 ENCOUNTER — Encounter: Payer: Self-pay | Admitting: "Endocrinology

## 2020-11-25 VITALS — BP 126/64 | HR 72 | Wt 99.0 lb

## 2020-11-25 DIAGNOSIS — E1159 Type 2 diabetes mellitus with other circulatory complications: Secondary | ICD-10-CM

## 2020-11-25 DIAGNOSIS — E1142 Type 2 diabetes mellitus with diabetic polyneuropathy: Secondary | ICD-10-CM

## 2020-11-25 DIAGNOSIS — N186 End stage renal disease: Secondary | ICD-10-CM | POA: Diagnosis not present

## 2020-11-25 DIAGNOSIS — Z992 Dependence on renal dialysis: Secondary | ICD-10-CM | POA: Diagnosis not present

## 2020-11-25 DIAGNOSIS — E1122 Type 2 diabetes mellitus with diabetic chronic kidney disease: Secondary | ICD-10-CM | POA: Diagnosis not present

## 2020-11-25 DIAGNOSIS — I120 Hypertensive chronic kidney disease with stage 5 chronic kidney disease or end stage renal disease: Secondary | ICD-10-CM

## 2020-11-25 DIAGNOSIS — E782 Mixed hyperlipidemia: Secondary | ICD-10-CM

## 2020-11-25 LAB — POCT GLYCOSYLATED HEMOGLOBIN (HGB A1C): HbA1c, POC (controlled diabetic range): 8.1 % — AB (ref 0.0–7.0)

## 2020-11-25 NOTE — Progress Notes (Signed)
Endocrinology Consult Note       11/25/2020, 6:27 PM   Subjective:    Patient ID: Erica [name], female    DOB: [birth year].  Erica [name] is being seen in consultation for management of currently uncontrolled symptomatic diabetes requested by  System, Provider Not In.   Past Medical History:  Diagnosis Date   Alcohol-induced pancreatitis    CAD (coronary artery disease) 09/17/2020   Chronic diarrhea    Depression    Diabetes mellitus    fasting blood sugar 110-120s   Dialysis patient ([place])    Diastolic CHF ([place])    DKA (diabetic ketoacidoses)    Gastroparesis    GERD (gastroesophageal reflux disease)    Heart murmur    History of kidney stones    Hyperlipidemia    Hypertension    Hypokalemia    Muscle spasm    Neuropathic pain    Neuropathy    Hx: of   Pyelonephritis    Seizures ([place])    Vitamin B12 deficiency    Vitamin D deficiency     Past Surgical History:  Procedure Laterality Date   AV FISTULA PLACEMENT Left 12/15/2017   Procedure: INSERTION OF ARTERIOVENOUS (AV) GORE-TEX GRAFT ARM;  Surgeon: Rosetta Posner, MD;  Location: [facility];  Service: Vascular;  Laterality: Left;   CATARACT EXTRACTION W/PHACO Right 03/14/2015   Procedure: CATARACT EXTRACTION PHACO AND INTRAOCULAR LENS PLACEMENT ([place]);  Surgeon: Baruch Goldmann, MD;  Location: [facility];  Service: Ophthalmology;  Laterality: Right;  CDE:11.13   CATARACT EXTRACTION W/PHACO Left 04/11/2015   Procedure: CATARACT EXTRACTION PHACO AND INTRAOCULAR LENS PLACEMENT LEFT EYE CDE=9.68;  Surgeon: Baruch Goldmann, MD;  Location: [facility];  Service: Ophthalmology;  Laterality: Left;   COLONOSCOPY  02/24/2010   cystoscopy with ureteral stent  Bilateral 10/07/2017   At [facility] CV LINE RIGHT  11/17/2017   IR FLUORO GUIDE CV LINE RIGHT  11/22/2017   IR REMOVAL TUN CV CATH W/O FL  01/26/2018   IR US GUIDE VASC ACCESS  RIGHT  11/17/2017   MULTIPLE EXTRACTIONS WITH ALVEOLOPLASTY N/A 06/13/2012   Procedure: MULTIPLE EXTRACION WITH ALVEOLOPLASTY EXTRACT: 18, 19, 20, 21, 22, 24, 25, 27, 28, 29, 30, 31;  Surgeon: Gae Bon, DDS;  Location: [facility];  Service: Oral Surgery;  Laterality: N/A;   ORIF HUMERUS FRACTURE Right 04/20/2019   Procedure: OPEN REDUCTION INTERNAL FIXATION (ORIF) PROXIMAL HUMERUS FRACTURE;  Surgeon: Marchia Bond, MD;  Location: [facility];  Service: Orthopedics;  Laterality: Right;   TUBAL LIGATION     URETERAL STENT PLACEMENT  09/2017    Social History   Socioeconomic History   Marital status: Legally Separated    Spouse name: Not on file   Number of children: Not on file   Years of education: Not on file   Highest education level: Not on file  Occupational History   Not on file  Tobacco Use   Smoking status: Never   Smokeless tobacco: Current    Types: Snuff  Vaping Use   Vaping Use: Never used  Substance and Sexual Activity  Alcohol use: Not Currently    Alcohol/week: 1.0 standard drink    Types: 1 Cans of beer per week   Drug use: No   Sexual activity: Not Currently  Other Topics Concern   Not on file  Social History Narrative   Not on file   Social Determinants of Health   Financial Resource Strain: Not on file  Food Insecurity: Not on file  Transportation Needs: Not on file  Physical Activity: Not on file  Stress: Not on file  Social Connections: Not on file    Family History  Problem Relation Age of Onset   Diabetes Sister    Chronic Renal Failure Neg Hx     Outpatient Encounter Medications as of 11/25/2020  Medication Sig   traZODone (DESYREL) 100 MG tablet Take 100 mg by mouth at bedtime.   acetaminophen (TYLENOL) 325 MG tablet Take 650 mg by mouth every 4 (four) hours as needed for moderate pain.   albuterol (VENTOLIN HFA) 108 (90 Base) MCG/ACT inhaler Inhale 2 puffs into the lungs every 4 (four) hours as needed for wheezing or shortness of breath.     calcitRIOL (ROCALTROL) 0.25 MCG capsule Take 1 capsule (0.25 mcg total) by mouth Every Tuesday,Thursday,and Saturday with dialysis.   carvedilol (COREG) 12.5 MG tablet Take 1 tablet (12.5 mg total) by mouth 2 (two) times daily.   cinacalcet (SENSIPAR) 30 MG tablet Take 30 mg by mouth every evening.   ferric citrate (AURYXIA) 1 GM 210 MG(Fe) tablet Take 2 tablets (420 mg total) by mouth 3 (three) times daily with meals.   folic acid (FOLVITE) 1 MG tablet Take 1 mg by mouth every morning.   hydrALAZINE (APRESOLINE) 25 MG tablet Take 2 tablets (50 mg total) by mouth every 8 (eight) hours.   insulin lispro (HUMALOG) 100 UNIT/ML injection Inject 4-7 Units into the skin in the morning, at noon, in the evening, and at bedtime. Per sliding scale   LEVEMIR 100 UNIT/ML injection Inject 10 Units into the skin at bedtime.   levETIRAcetam (KEPPRA) 1000 MG tablet Take 500 mg by mouth at bedtime.   loperamide (IMODIUM) 2 MG capsule Take 4 mg by mouth daily as needed for diarrhea or loose stools.   pantoprazole (PROTONIX) 40 MG tablet Take 1 tablet (40 mg total) by mouth daily.   promethazine (PHENERGAN) 25 MG tablet Take 25 mg by mouth every 8 (eight) hours as needed for nausea or vomiting.   [DISCONTINUED] insulin glargine-yfgn (SEMGLEE) 100 UNIT/ML injection Inject 0.01 mLs (1 Units total) into the skin daily. (Patient not taking: Reported on 11/25/2020)   [DISCONTINUED] TRULICITY 1.5 0000000 SOPN Inject 1.5 mg into the skin once a week.   No facility-administered encounter medications on file as of 11/25/2020.    ALLERGIES: Allergies  Allergen Reactions   Aspirin Palpitations and Other (See Comments)    Listed on [facility] 11/12/18    VACCINATION STATUS: Immunization History  Administered Date(s) Administered   Influenza,inj,Quad PF,6+ Mos 11/09/2017   PPD Test 11/12/2017   Pneumococcal Polysaccharide-23 05/20/2012    Diabetes She presents for her initial diabetic visit. 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 Her disease course has been worsening. There are no hypoglycemic associated symptoms. Pertinent negatives for hypoglycemia include no confusion, headaches, pallor or seizures. Associated symptoms include blurred vision, fatigue and polydipsia. Pertinent negatives for diabetes include no chest pain, no polyphagia and no polyuria. There are no hypoglycemic complications. Symptoms are  worsening. Diabetic complications include heart disease, nephropathy, peripheral neuropathy and retinopathy. Risk factors for coronary artery disease include diabetes mellitus, post-menopausal, sedentary lifestyle and tobacco exposure. Current diabetic treatments: She is currently on Levemir, and Humalog.  Her medication list also includes Semglee, Trulicity. Her weight is fluctuating minimally. She is following a generally unhealthy diet. She has not had a previous visit with a dietitian. She never (Patient is wheelchair-bound due to deconditioning, neuropathic pain, and muscle spasms.) participates in exercise. Her breakfast blood glucose range is generally >200 mg/dl. Her lunch blood glucose range is generally >200 mg/dl. Her dinner blood glucose range is generally >200 mg/dl. Her bedtime blood glucose range is generally >200 mg/dl. Her overall blood glucose range is >200 mg/dl. (She is accompanied by her driver.  Her glucose logs from the nursing home are significant for persistent severe hyperglycemia.  No hypoglycemia was appreciated.  Her recent A1c of 9%, today at point-of-care was 8.1%.)    Review of Systems  Constitutional:  Positive for fatigue. Negative for chills, fever and unexpected weight change.  HENT:  Negative for trouble swallowing and voice change.   Eyes:  Positive for blurred vision. Negative for visual disturbance.  Respiratory:  Negative for cough, shortness of breath and wheezing.   Cardiovascular:  Negative for chest  pain, palpitations and leg swelling.  Gastrointestinal:  Negative for diarrhea, nausea and vomiting.  Endocrine: Positive for polydipsia. Negative for cold intolerance, heat intolerance, polyphagia and polyuria.  Musculoskeletal:  Positive for arthralgias and gait problem. Negative for myalgias.  Skin:  Negative for color change, pallor, rash and wound.  Neurological:  Negative for seizures and headaches.  Psychiatric/Behavioral:  Negative for confusion and suicidal ideas.    Objective:    Vitals with BMI 11/25/2020 10/03/2020 10/03/2020  Height - - -  Weight 99 lbs - -  BMI - - -  Systolic [id number] 0000000 0000000  Diastolic 64 87 76  Pulse 72 73 73    BP 126/64   Pulse 72   Wt 99 lb (44.9 kg)   BMI 19.33 kg/m   Wt Readings from Last 3 Encounters:  11/25/20 99 lb (44.9 kg)  10/03/20 101 lb 10.1 oz (46.1 kg)  09/24/20 101 lb 10.1 oz (46.1 kg)     Physical Exam Constitutional:      Appearance: She is well-developed.  HENT:     Head: Normocephalic and atraumatic.  Neck:     Thyroid: No thyromegaly.     Trachea: No tracheal deviation.  Pulmonary:     Effort: Pulmonary effort is normal.  Abdominal:     Tenderness: There is no abdominal tenderness. There is no guarding.  Musculoskeletal:     Cervical back: Normal range of motion and neck supple.     Comments: Patient is wheelchair-bound due to deconditioning, muscular spasms as well as neuropathic pain.  Skin:    General: Skin is warm and dry.     Coloration: Skin is not pale.     Findings: No erythema or rash.  Neurological:     Mental Status: She is alert and oriented to person, place, and time.     Cranial Nerves: No cranial nerve deficit.     Coordination: Coordination normal.     Deep Tendon Reflexes: Reflexes are normal and symmetric.  Psychiatric:     Comments: Patient with possible cognitive decline.  She is not comprehensive about her diabetes history.  Her driver is not aware of her medical history.  CMP ( most  recent) CMP     Component Value Date/Time   NA 131 (L) 10/03/2020 1155   K 3.8 10/03/2020 1155   CL 99 10/03/2020 1155   CO2 24 10/03/2020 1155   GLUCOSE 201 (H) 10/03/2020 1155   BUN 37 (H) 10/03/2020 1155   CREATININE 4.97 (H) 10/03/2020 1155   CALCIUM 9.7 10/03/2020 1155   PROT 6.5 09/18/2020 0411   ALBUMIN 3.3 (L) 09/24/2020 0317   AST 24 09/18/2020 0411   ALT 16 09/18/2020 0411   ALKPHOS 147 (H) 09/18/2020 0411   BILITOT 1.1 09/18/2020 0411   GFRNONAA 9 (L) 10/03/2020 1155   GFRAA 15 (L) 11/18/2019 0742     Diabetic Labs (most recent): Lab Results  Component Value Date   HGBA1C 8.1 (A) 11/25/2020   HGBA1C 9.0 (H) 08/16/2020   HGBA1C 6.2 (H) 09/28/2019     Lipid Panel ( most recent) Lipid Panel     Component Value Date/Time   CHOL 86 11/18/2017 0434   TRIG 85 11/18/2017 0434   HDL 29 (L) 11/18/2017 0434   CHOLHDL 3.0 11/18/2017 0434   VLDL 17 11/18/2017 0434   LDLCALC 40 11/18/2017 0434          Assessment & Plan:   1. 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  Diabetes for at least 5 years.  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  Her point-of-care A1c is 8.1%, slightly improving from 9% during previous measurement.    - I had a long discussion with her about the  pathology behind her diabetes and its  complications. -her diabetes is complicated by end-stage renal disease on dialysis, peripheral neuropathy, CHF, deconditioning, previous history of alcohol /smoking, and she remains at a high risk for more acute and chronic complications which include CAD, CVA, CKD, retinopathy, and neuropathy. These are all discussed in detail with her.  - I have counseled her on diet  and weight management  by adopting a carbohydrate restricted/protein rich diet. Patient is encouraged to switch to  unprocessed or minimally processed      complex starch and increased protein intake (animal or plant source), fruits, and vegetables. -  she is advised to stick to a routine mealtimes to eat 3 meals  a day and avoid unnecessary snacks ( to snack only to correct hypoglycemia).   - she acknowledges that there is a room for improvement in her food and drink choices. - Suggestion is made for her to avoid simple carbohydrates  from her diet including Cakes, Sweet Desserts, Ice Cream, Soda (diet and regular), Sweet Tea, Candies, Chips, Cookies, Store Bought Juices, Alcohol in Excess of  1-2 drinks a day, Artificial Sweeteners,  Coffee Creamer, and "Sugar-free" Products. This will help patient to have more stable blood glucose profile and potentially avoid unintended weight gain.  - she will be scheduled with Jearld Fenton, RDN, CDE for diabetes education.  - I have approached her with the following individualized plan to manage  her diabetes and patient agrees:   -This patient will benefit from appropriate nutritional rehabitation.  In light of her diabetes likely due to pancreatic failure, she will continue to need intensive treatment with basal/bolus insulin in order for her to achieve and maintain control of diabetes to target.  -Accordingly, I advised her to discontinue tried Trulicity, will discontinue Semglee. -I advised to adjust her Levemir at 10  units nightly, and adjust her Humalog to 4-7 units Premeal only when her Premeal blood glucose readings are above 90 mg per DL.  For this to happen, she will have to continue monitoring blood glucose 4 times a day-before meals and at bedtime.  - she is encouraged to call clinic for blood glucose levels less than 70 or above 300 mg /dl. -She is not a candidate for incretin therapy, SGLT2 inhibitors, nor metformin. - Specific targets for  A1c;  LDL, HDL,  and Triglycerides were discussed with the patient.  2) Blood Pressure /Hypertension:  her blood pressure is  controlled to target.   she is  advised to continue her current medications including carvedilol 12.5 mg p.o. twice daily with breakfast and supper. 3) Lipids/Hyperlipidemia: She does not have recent lipid panel.  Review of her previous lipid panel showed LDL at 40.  She is not on statins, and she is not a good candidate for statins given her heavy alcohol history.    4)  Weight/Diet:  Body mass index is 19.33 kg/m.  -She is not a candidate for weight loss.  She would benefit from nutritional rehabitation with appropriate supplements and meal replacements when she has poor appetite.   She cannot exercise optimally due to her deconditioning.    5) Chronic Care/Health Maintenance:  -she  is not  on ACEI/ARB and Statin medications , see above, is encouraged to initiate and continue to follow up with Ophthalmology, Dentist,  Podiatrist at least yearly or according to recommendations, and advised to   stay away from smoking. I have recommended yearly flu vaccine and pneumonia vaccine at least every 5 years; moderate intensity exercise for up to 150 minutes weekly; and  sleep for at least 7 hours a day.  - she is  advised to maintain close follow up with System, Provider Not In for primary care needs, as well as her other providers for optimal and coordinated care.   I spent 66 minutes in the care of the patient today including review of labs from [place], Lipids, Thyroid Function, Hematology (current and previous including abstractions from other facilities); face-to-face time discussing  her blood glucose readings/logs, discussing hypoglycemia and hyperglycemia episodes and symptoms, medications doses, her options of short and long term treatment based on the latest standards of care / guidelines;  discussion about incorporating lifestyle medicine;  and documenting the encounter.     Please refer to Patient Instructions for Blood Glucose Monitoring and Insulin/Medications Dosing Guide"  in media tab for additional information. Please   also refer to " Patient Self Inventory" in the Media  tab for reviewed elements of pertinent patient history.  Erica [name] participated in the discussions, expressed understanding, and voiced agreement with the above plans.  All questions were answered to her satisfaction. she is encouraged to contact clinic should she have any questions or concerns prior to her return visit.   Follow up plan: - Return in about 2 weeks (around 12/09/2020) for F/U with Meter and Logs Only - no Labs.  Glade Lloyd, MD [facility] [address] [place], [place] [address] Phone: [phone]  Fax: [phone]    11/25/2020, 6:27 PM  This note was partially dictated with voice recognition software. Similar sounding words can be transcribed inadequately or may not  be corrected upon review.

## 2020-12-11 ENCOUNTER — Encounter: Payer: Self-pay | Admitting: "Endocrinology

## 2020-12-11 ENCOUNTER — Other Ambulatory Visit: Payer: Self-pay

## 2020-12-11 ENCOUNTER — Ambulatory Visit (INDEPENDENT_AMBULATORY_CARE_PROVIDER_SITE_OTHER): Payer: Medicare Other | Admitting: "Endocrinology

## 2020-12-11 VITALS — BP 100/48 | HR 72 | Wt 100.2 lb

## 2020-12-11 DIAGNOSIS — E1122 Type 2 diabetes mellitus with diabetic chronic kidney disease: Secondary | ICD-10-CM | POA: Diagnosis not present

## 2020-12-11 DIAGNOSIS — N186 End stage renal disease: Secondary | ICD-10-CM

## 2020-12-11 NOTE — Progress Notes (Signed)
11/25/2020, 6:27 PM  Endocrinology follow-up note   Subjective:    Patient ID: Erica [name], female    DOB: 05-08-1955.  Erica [name] is being seen in follow-up after she was seen in consultation for management of currently uncontrolled symptomatic diabetes requested by  System, Provider Not In.   Past Medical History:  Diagnosis Date   Alcohol-induced pancreatitis    CAD (coronary artery disease) 09/17/2020   Chronic diarrhea    Depression    Diabetes mellitus    fasting blood sugar 110-120s   Dialysis patient ([place])    Diastolic CHF ([place])    DKA (diabetic ketoacidoses)    Gastroparesis    GERD (gastroesophageal reflux disease)    Heart murmur    History of kidney stones    Hyperlipidemia    Hypertension    Hypokalemia    Muscle spasm    Neuropathic pain    Neuropathy    Hx: of   Pyelonephritis    Seizures ([place])    Vitamin B12 deficiency    Vitamin D deficiency     Past Surgical History:  Procedure Laterality Date   AV FISTULA PLACEMENT Left 12/15/2017   Procedure: INSERTION OF ARTERIOVENOUS (AV) GORE-TEX GRAFT ARM;  Surgeon: Rosetta Posner, MD;  Location: [facility];  Service: Vascular;  Laterality: Left;   CATARACT EXTRACTION W/PHACO Right 03/14/2015   Procedure: CATARACT EXTRACTION PHACO AND INTRAOCULAR LENS PLACEMENT ([place]);  Surgeon: Baruch Goldmann, MD;  Location: [facility];  Service: Ophthalmology;  Laterality: Right;  CDE:11.13   CATARACT EXTRACTION W/PHACO Left 04/11/2015   Procedure: CATARACT EXTRACTION PHACO AND INTRAOCULAR LENS PLACEMENT LEFT EYE CDE=9.68;  Surgeon: Baruch Goldmann, MD;  Location: [facility];  Service: Ophthalmology;  Laterality: Left;   COLONOSCOPY  02/24/2010   cystoscopy with ureteral stent  Bilateral 10/07/2017   At [facility] CV LINE RIGHT  11/17/2017   IR FLUORO GUIDE CV LINE RIGHT  11/22/2017   IR REMOVAL TUN CV CATH W/O FL  01/26/2018   IR US GUIDE VASC ACCESS RIGHT  11/17/2017   MULTIPLE  EXTRACTIONS WITH ALVEOLOPLASTY N/A 06/13/2012   Procedure: MULTIPLE EXTRACION WITH ALVEOLOPLASTY EXTRACT: 18, 19, 20, 21, 22, 24, 25, 27, 28, 29, 30, 31;  Surgeon: Gae Bon, DDS;  Location: [facility];  Service: Oral Surgery;  Laterality: N/A;   ORIF HUMERUS FRACTURE Right 04/20/2019   Procedure: OPEN REDUCTION INTERNAL FIXATION (ORIF) PROXIMAL HUMERUS FRACTURE;  Surgeon: Marchia Bond, MD;  Location: [facility];  Service: Orthopedics;  Laterality: Right;   TUBAL LIGATION     URETERAL STENT PLACEMENT  09/2017    Social History   Socioeconomic History   Marital status: Legally Separated    Spouse name: Not on file   Number of children: Not on file   Years of education: Not on file   Highest education level: Not on file  Occupational History   Not on file  Tobacco Use   Smoking status: Never   Smokeless tobacco: Current    Types: Snuff  Vaping Use   Vaping Use: Never used  Substance and Sexual Activity   Alcohol use: Not Currently    Alcohol/week: 1.0 standard drink    Types: 1 Cans of beer per week   Drug use: No   Sexual activity: Not Currently  Other Topics Concern   Not on file  Social History Narrative   Not on file   Social Determinants  of Health   Financial Resource Strain: Not on file  Food Insecurity: Not on file  Transportation Needs: Not on file  Physical Activity: Not on file  Stress: Not on file  Social Connections: Not on file    Family History  Problem Relation Age of Onset   Diabetes Sister    Chronic Renal Failure Neg Hx     Outpatient Encounter Medications as of 11/25/2020  Medication Sig   traZODone (DESYREL) 100 MG tablet Take 100 mg by mouth at bedtime.   acetaminophen (TYLENOL) 325 MG tablet Take 650 mg by mouth every 4 (four) hours as needed for moderate pain.   albuterol (VENTOLIN HFA) 108 (90 Base) MCG/ACT inhaler Inhale 2 puffs into the lungs every 4 (four) hours as needed for wheezing or shortness of breath.    calcitRIOL (ROCALTROL) 0.25 MCG  capsule Take 1 capsule (0.25 mcg total) by mouth Every Tuesday,Thursday,and Saturday with dialysis.   carvedilol (COREG) 12.5 MG tablet Take 1 tablet (12.5 mg total) by mouth 2 (two) times daily.   cinacalcet (SENSIPAR) 30 MG tablet Take 30 mg by mouth every evening.   ferric citrate (AURYXIA) 1 GM 210 MG(Fe) tablet Take 2 tablets (420 mg total) by mouth 3 (three) times daily with meals.   folic acid (FOLVITE) 1 MG tablet Take 1 mg by mouth every morning.   hydrALAZINE (APRESOLINE) 25 MG tablet Take 2 tablets (50 mg total) by mouth every 8 (eight) hours.   insulin lispro (HUMALOG) 100 UNIT/ML injection Inject 4-7 Units into the skin in the morning, at noon, in the evening, and at bedtime. Per sliding scale   LEVEMIR 100 UNIT/ML injection Inject 10 Units into the skin at bedtime.   levETIRAcetam (KEPPRA) 1000 MG tablet Take 500 mg by mouth at bedtime.   loperamide (IMODIUM) 2 MG capsule Take 4 mg by mouth daily as needed for diarrhea or loose stools.   pantoprazole (PROTONIX) 40 MG tablet Take 1 tablet (40 mg total) by mouth daily.   promethazine (PHENERGAN) 25 MG tablet Take 25 mg by mouth every 8 (eight) hours as needed for nausea or vomiting.   [DISCONTINUED] insulin glargine-yfgn (SEMGLEE) 100 UNIT/ML injection Inject 0.01 mLs (1 Units total) into the skin daily. (Patient not taking: Reported on 11/25/2020)   [DISCONTINUED] TRULICITY 1.5 TK/3.5WS SOPN Inject 1.5 mg into the skin once a week.   No facility-administered encounter medications on file as of 11/25/2020.    ALLERGIES: Allergies  Allergen Reactions   Aspirin Palpitations and Other (See Comments)    Listed on [facility] 11/12/18    VACCINATION STATUS: Immunization History  Administered Date(s) Administered   Influenza,inj,Quad PF,6+ Mos 11/09/2017   PPD Test 11/12/2017   Pneumococcal Polysaccharide-23 05/20/2012    Diabetes She presents for her follow-up diabetic visit. 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 Her disease course has been worsening. There are no hypoglycemic associated symptoms. Pertinent negatives for hypoglycemia include no confusion, headaches, pallor or seizures. Associated symptoms include blurred vision, fatigue and polydipsia. Pertinent negatives for diabetes include no chest pain, no polyphagia and no polyuria. There are no hypoglycemic complications. Symptoms are worsening. Diabetic complications include heart disease, nephropathy, peripheral neuropathy and retinopathy. Risk factors for coronary artery disease include diabetes mellitus, post-menopausal, sedentary lifestyle and tobacco exposure. Current diabetic treatments: She is currently on Levemir, and Humalog.  Her medication list also includes Semglee, Trulicity. Her weight is fluctuating minimally. She is following a generally  unhealthy diet. She has not had a previous visit with a dietitian. She never (Patient is wheelchair-bound due to deconditioning, neuropathic pain, and muscle spasms.) participates in exercise. Her home blood glucose trend is increasing steadily. Her breakfast blood glucose range is generally >200 mg/dl. Her lunch blood glucose range is generally >200 mg/dl. Her dinner blood glucose range is generally >200 mg/dl. Her bedtime blood glucose range is generally >200 mg/dl. Her overall blood glucose range is >200 mg/dl. (She is accompanied by her driver.  Her glucose logs from her nursing home are still showing significant hypoglycemia.  No documented hypoglycemia.  Her recent A1c was 8.1% at point-of-care during her last visit.  )    Review of Systems  Constitutional:  Positive for fatigue. Negative for chills, fever and unexpected weight change.  HENT:  Negative for trouble swallowing and voice change.   Eyes:  Positive for blurred vision. Negative for visual disturbance.  Respiratory:  Negative for cough, shortness of breath and wheezing.   Cardiovascular:   Negative for chest pain, palpitations and leg swelling.  Gastrointestinal:  Negative for diarrhea, nausea and vomiting.  Endocrine: Positive for polydipsia. Negative for cold intolerance, heat intolerance, polyphagia and polyuria.  Musculoskeletal:  Positive for arthralgias and gait problem. Negative for myalgias.  Skin:  Negative for color change, pallor, rash and wound.  Neurological:  Negative for seizures and headaches.  Psychiatric/Behavioral:  Negative for confusion and suicidal ideas.    Objective:    Vitals with BMI 11/25/2020 10/03/2020 10/03/2020  Height - - -  Weight 99 lbs - -  BMI - - -  Systolic 700 174 944  Diastolic 64 87 76  Pulse 72 73 73    BP 126/64   Pulse 72   Wt 99 lb (44.9 kg)   BMI 19.33 kg/m   Wt Readings from Last 3 Encounters:  11/25/20 99 lb (44.9 kg)  10/03/20 101 lb 10.1 oz (46.1 kg)  09/24/20 101 lb 10.1 oz (46.1 kg)     Physical Exam Constitutional:      Appearance: She is well-developed.  HENT:     Head: Normocephalic and atraumatic.  Neck:     Thyroid: No thyromegaly.     Trachea: No tracheal deviation.  Pulmonary:     Effort: Pulmonary effort is normal.  Abdominal:     Tenderness: There is no abdominal tenderness. There is no guarding.  Musculoskeletal:     Cervical back: Normal range of motion and neck supple.     Comments: Patient is wheelchair-bound due to deconditioning, muscular spasms as well as neuropathic pain.  Skin:    General: Skin is warm and dry.     Coloration: Skin is not pale.     Findings: No erythema or rash.  Neurological:     Mental Status: She is alert and oriented to person, place, and time.     Cranial Nerves: No cranial nerve deficit.     Coordination: Coordination normal.     Deep Tendon Reflexes: Reflexes are normal and symmetric.  Psychiatric:     Comments: Patient with possible cognitive decline.  She is not comprehensive about her diabetes history.  Her driver is not aware of her medical history.      CMP ( most recent) CMP     Component Value Date/Time   NA 131 (L) 10/03/2020 1155   K 3.8 10/03/2020 1155   CL 99 10/03/2020 1155   CO2 24 10/03/2020 1155   GLUCOSE 201 (H) 10/03/2020 1155  BUN 37 (H) 10/03/2020 1155   CREATININE 4.97 (H) 10/03/2020 1155   CALCIUM 9.7 10/03/2020 1155   PROT 6.5 09/18/2020 0411   ALBUMIN 3.3 (L) 09/24/2020 0317   AST 24 09/18/2020 0411   ALT 16 09/18/2020 0411   ALKPHOS 147 (H) 09/18/2020 0411   BILITOT 1.1 09/18/2020 0411   GFRNONAA 9 (L) 10/03/2020 1155   GFRAA 15 (L) 11/18/2019 0742     Diabetic Labs (most recent): Lab Results  Component Value Date   HGBA1C 8.1 (A) 11/25/2020   HGBA1C 9.0 (H) 08/16/2020   HGBA1C 6.2 (H) 09/28/2019     Lipid Panel ( most recent) Lipid Panel     Component Value Date/Time   CHOL 86 11/18/2017 0434   TRIG 85 11/18/2017 0434   HDL 29 (L) 11/18/2017 0434   CHOLHDL 3.0 11/18/2017 0434   VLDL 17 11/18/2017 0434   LDLCALC 40 11/18/2017 0434          Assessment & Plan:   1. 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  Diabetes for at least 5 years.  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 She is accompanied by her driver.  Her glucose logs from her nursing home are still showing significant hypoglycemia.  No documented hypoglycemia.  Her recent A1c was 8.1% at point-of-care during her last visit.  - I had a long discussion with her about the  pathology behind her diabetes and its  complications. -her diabetes is complicated by end-stage renal disease on dialysis, peripheral neuropathy, CHF, deconditioning, previous history of alcohol /smoking, and she remains at a high risk for more acute and chronic complications which include CAD, CVA, CKD, retinopathy, and neuropathy. These are all discussed in detail with her.  - I have counseled her on diet  and weight  management  by adopting a carbohydrate restricted/protein rich diet. Patient is encouraged to switch to  unprocessed or minimally processed     complex starch and increased protein intake (animal or plant source), fruits, and vegetables. -  she is advised to stick to a routine mealtimes to eat 3 meals  a day and avoid unnecessary snacks ( to snack only to correct hypoglycemia).   - Suggestion is made for her to avoid simple carbohydrates  from her diet including Cakes, Sweet Desserts, Ice Cream, Soda (diet and regular), Sweet Tea, Candies, Chips, Cookies, Store Bought Juices, Alcohol in Excess of  1-2 drinks a day, Artificial Sweeteners,  Coffee Creamer, and "Sugar-free" Products, Lemonade. This will help patient to have more stable blood glucose profile and potentially avoid unintended weight gain.  - she will be scheduled with Jearld Fenton, RDN, CDE for diabetes education.  - I have approached her with the following individualized plan to manage  her diabetes and patient agrees:   -This patient will benefit from appropriate nutritional rehabitation.  In light of her diabetes likely due to pancreatic failure, she will continue to need intensive treatment with basal/bolus insulin in order for her to achieve and maintain control of diabetes to target.   -Accordingly, she is advised to increase Levemir to 14 units nightly, adjust her Humalog to 4-7  units Premeal only when her Premeal blood glucose readings are above 90 mg per DL.  This needs continuous monitoring of blood glucose 4 times a day-before meals and at bedtime.  -She is not a suitable candidate for GLP-1 receptor agonists, SGLT2  inhibitors nor metformin. - she is encouraged to call clinic for blood glucose levels less than 70 or above 300 mg /dl.  - Specific targets for  A1c;  LDL, HDL,  and Triglycerides were discussed with the patient.  2) Blood Pressure /Hypertension:  her blood pressure is  controlled to target.   she is advised to  continue her current medications including carvedilol 12.5 mg p.o. twice daily with breakfast and supper.   3) Lipids/Hyperlipidemia: She does not have recent lipid panel.  Review of her previous lipid panel showed LDL at 40.  She is not on statins, and she is not a good candidate for statins given her heavy alcohol history.    4)  Weight/Diet:  Body mass index is 19.33 kg/m.  -She is not a candidate for weight loss.  She would benefit from nutritional rehabitation with appropriate supplements and meal replacements when she has poor appetite.   She cannot exercise optimally due to her deconditioning.    5) Chronic Care/Health Maintenance:  -she  is not  on ACEI/ARB and Statin medications , see above, is encouraged to initiate and continue to follow up with Ophthalmology, Dentist,  Podiatrist at least yearly or according to recommendations, and advised to   stay away from smoking. I have recommended yearly flu vaccine and pneumonia vaccine at least every 5 years; moderate intensity exercise for up to 150 minutes weekly; and  sleep for at least 7 hours a day.  - she is  advised to maintain close follow up with System, Provider Not In for primary care needs, as well as her other providers for optimal and coordinated care.    I spent 35 minutes in the care of the patient today including review of labs from [facility], Lipids, Thyroid Function, Hematology (current and previous including abstractions from other facilities); face-to-face time discussing  her blood glucose readings/logs, discussing hypoglycemia and hyperglycemia episodes and symptoms, medications doses, her options of short and long term treatment based on the latest standards of care / guidelines;  discussion about incorporating lifestyle medicine;  and documenting the encounter.    Please refer to Patient Instructions for Blood Glucose Monitoring and Insulin/Medications Dosing Guide"  in media tab for additional information. Please  also  refer to " Patient Self Inventory" in the Media  tab for reviewed elements of pertinent patient history.  Erica [name] participated in the discussions, expressed understanding, and voiced agreement with the above plans.  All questions were answered to her satisfaction. she is encouraged to contact clinic should she have any questions or concerns prior to her return visit.   Follow up plan: - Return in about 2 weeks (around 12/09/2020) for F/U with Meter and Logs Only - no Labs.  Glade Lloyd, MD [facility] [address] [place], [place] [address] Phone: [phone]  Fax: [phone]    11/25/2020, 6:27 PM  This note was partially dictated with voice recognition software. Similar sounding words can be transcribed inadequately or may not  be corrected upon review.

## 2021-01-12 ENCOUNTER — Emergency Department (HOSPITAL_COMMUNITY)
Admission: EM | Admit: 2021-01-12 | Discharge: 2021-01-12 | Disposition: A | Discharge status: Home / Self Care | Payer: Medicare Other | Attending: Emergency Medicine | Admitting: Emergency Medicine

## 2021-01-12 ENCOUNTER — Encounter (HOSPITAL_COMMUNITY): Payer: Self-pay | Admitting: Emergency Medicine

## 2021-01-12 ENCOUNTER — Other Ambulatory Visit: Payer: Self-pay

## 2021-01-12 ENCOUNTER — Emergency Department (HOSPITAL_COMMUNITY): Payer: Medicare Other

## 2021-01-12 DIAGNOSIS — N186 End stage renal disease: Secondary | ICD-10-CM | POA: Diagnosis not present

## 2021-01-12 DIAGNOSIS — Z992 Dependence on renal dialysis: Secondary | ICD-10-CM | POA: Diagnosis not present

## 2021-01-12 DIAGNOSIS — S62336A Displaced fracture of neck of fifth metacarpal bone, right hand, initial encounter for closed fracture: Secondary | ICD-10-CM | POA: Diagnosis not present

## 2021-01-12 DIAGNOSIS — F17220 Nicotine dependence, chewing tobacco, uncomplicated: Secondary | ICD-10-CM | POA: Insufficient documentation

## 2021-01-12 DIAGNOSIS — L299 Pruritus, unspecified: Secondary | ICD-10-CM | POA: Insufficient documentation

## 2021-01-12 DIAGNOSIS — I2510 Atherosclerotic heart disease of native coronary artery without angina pectoris: Secondary | ICD-10-CM | POA: Diagnosis not present

## 2021-01-12 DIAGNOSIS — I5032 Chronic diastolic (congestive) heart failure: Secondary | ICD-10-CM | POA: Insufficient documentation

## 2021-01-12 DIAGNOSIS — D631 Anemia in chronic kidney disease: Secondary | ICD-10-CM | POA: Insufficient documentation

## 2021-01-12 DIAGNOSIS — E1140 Type 2 diabetes mellitus with diabetic neuropathy, unspecified: Secondary | ICD-10-CM | POA: Insufficient documentation

## 2021-01-12 DIAGNOSIS — E1122 Type 2 diabetes mellitus with diabetic chronic kidney disease: Secondary | ICD-10-CM | POA: Diagnosis not present

## 2021-01-12 DIAGNOSIS — Z79899 Other long term (current) drug therapy: Secondary | ICD-10-CM | POA: Diagnosis not present

## 2021-01-12 DIAGNOSIS — S6991XA Unspecified injury of right wrist, hand and finger(s), initial encounter: Secondary | ICD-10-CM | POA: Diagnosis present

## 2021-01-12 DIAGNOSIS — X58XXXA Exposure to other specified factors, initial encounter: Secondary | ICD-10-CM | POA: Insufficient documentation

## 2021-01-12 DIAGNOSIS — I132 Hypertensive heart and chronic kidney disease with heart failure and with stage 5 chronic kidney disease, or end stage renal disease: Secondary | ICD-10-CM | POA: Diagnosis not present

## 2021-01-12 DIAGNOSIS — Z794 Long term (current) use of insulin: Secondary | ICD-10-CM | POA: Insufficient documentation

## 2021-01-12 LAB — COMPREHENSIVE METABOLIC PANEL
ALT: 49 U/L — ABNORMAL HIGH (ref 0–44)
AST: 56 U/L — ABNORMAL HIGH (ref 15–41)
Albumin: 3.5 g/dL (ref 3.5–5.0)
Alkaline Phosphatase: 220 U/L — ABNORMAL HIGH (ref 38–126)
Anion gap: 12 (ref 5–15)
BUN: 45 mg/dL — ABNORMAL HIGH (ref 8–23)
CO2: 22 mmol/L (ref 22–32)
Calcium: 8.9 mg/dL (ref 8.9–10.3)
Chloride: 94 mmol/L — ABNORMAL LOW (ref 98–111)
Creatinine, Ser: 5.32 mg/dL — ABNORMAL HIGH (ref 0.44–1.00)
GFR, Estimated: 8 mL/min — ABNORMAL LOW (ref >60)
Glucose, Bld: 228 mg/dL — ABNORMAL HIGH (ref 70–99)
Potassium: 4.4 mmol/L (ref 3.5–5.1)
Sodium: 128 mmol/L — ABNORMAL LOW (ref 135–145)
Total Bilirubin: 0.6 mg/dL (ref 0.3–1.2)
Total Protein: 6.5 g/dL (ref 6.5–8.1)

## 2021-01-12 LAB — CBC WITH DIFFERENTIAL/PLATELET
Abs Immature Granulocytes: 0.01 10*3/uL (ref 0.00–0.07)
Basophils Absolute: 0 10*3/uL (ref 0.0–0.1)
Basophils Relative: 0 %
Eosinophils Absolute: 0.3 10*3/uL (ref 0.0–0.5)
Eosinophils Relative: 4 %
HCT: 28.2 % — ABNORMAL LOW (ref 36.0–46.0)
Hemoglobin: 9 g/dL — ABNORMAL LOW (ref 12.0–15.0)
Immature Granulocytes: 0 %
Lymphocytes Relative: 22 %
Lymphs Abs: 1.5 10*3/uL (ref 0.7–4.0)
MCH: 35.7 pg — ABNORMAL HIGH (ref 26.0–34.0)
MCHC: 31.9 g/dL (ref 30.0–36.0)
MCV: 111.9 fL — ABNORMAL HIGH (ref 80.0–100.0)
Monocytes Absolute: 1 10*3/uL (ref 0.1–1.0)
Monocytes Relative: 14 %
Neutro Abs: 4.3 10*3/uL (ref 1.7–7.7)
Neutrophils Relative %: 60 %
Platelets: 129 10*3/uL — ABNORMAL LOW (ref 150–400)
RBC: 2.52 MIL/uL — ABNORMAL LOW (ref 3.87–5.11)
RDW: 12.8 % (ref 11.5–15.5)
WBC: 7.1 10*3/uL (ref 4.0–10.5)
nRBC: 0 % (ref 0.0–0.2)

## 2021-01-12 MED ORDER — DIPHENHYDRAMINE HCL 50 MG/ML IJ SOLN
25.0000 mg | Freq: Once | INTRAMUSCULAR | Status: DC
  Filled 2021-01-12: qty 1

## 2021-01-12 MED ORDER — HYDROCODONE-ACETAMINOPHEN 5-325 MG PO TABS
1.0000 | ORAL_TABLET | Freq: Once | ORAL | Status: AC
  Administered 2021-01-12: 19:00:00 1 via ORAL
  Filled 2021-01-12: qty 1

## 2021-01-12 MED ORDER — HYDROCODONE-ACETAMINOPHEN 5-325 MG PO TABS: 1.0000 | ORAL_TABLET | ORAL | 0 refills | Status: DC | PRN

## 2021-01-12 MED ORDER — DIPHENHYDRAMINE HCL 25 MG PO CAPS
25.0000 mg | ORAL_CAPSULE | Freq: Once | ORAL | Status: AC
  Administered 2021-01-12: 17:00:00 25 mg via ORAL
  Filled 2021-01-12: qty 1

## 2021-01-12 NOTE — ED Triage Notes (Signed)
Pt arrived via EMS for R finger fracture

## 2021-01-12 NOTE — ED Notes (Signed)
Pt sitting in bed on phone

## 2021-01-12 NOTE — ED Notes (Signed)
MSE at bedside during triage

## 2021-01-12 NOTE — ED Provider Notes (Signed)
[facility] EMERGENCY DEPARTMENT Provider Note   CSN: [id number] Arrival date & time: 01/12/21  1450     History No chief complaint on file.   Erica [name] is a 65 y.o. female.  Pt presents to the ED today with right hand pain.  Pt said her hand and her body have been so itchy that she's been pulling at her right hand and now it hurts.  Pt denies any trauma.  Pt denies any n/v. No f/c.  Pt is a dialysis patient.  She goes Tu, Th, and Sat.  She did a complete treatment yesterday.      Past Medical History:  Diagnosis Date   Alcohol-induced pancreatitis    CAD (coronary artery disease) 09/17/2020   Chronic diarrhea    Depression    Diabetes mellitus    fasting blood sugar 110-120s   Dialysis patient ([place])    Diastolic CHF ([place])    DKA (diabetic ketoacidoses)    Gastroparesis    GERD (gastroesophageal reflux disease)    Heart murmur    History of kidney stones    Hyperlipidemia    Hypertension    Hypokalemia    Muscle spasm    Neuropathic pain    Neuropathy    Hx: of   Pyelonephritis    Seizures ([place])    Vitamin B12 deficiency    Vitamin D deficiency     Patient Active Problem List   Diagnosis Date Noted   CAD (coronary artery disease) 09/17/2020   Altered mental status    Acute metabolic encephalopathy 44/04/4740   Diarrhea    Pressure injury of skin 06/09/2020   Sacral fracture ([place]) 06/08/2020   Thrombocytopenia ([place]) 06/08/2020   Hypertensive urgency 06/08/2020   (HFpEF) heart failure with preserved ejection fraction ([place]) 06/08/2020   Fall 06/08/2020   Elevated MCV 06/08/2020   Palliative care by specialist    Generalized weakness 11/14/2019   HCAP (healthcare-associated pneumonia) 11/14/2019   Hyperkalemia 11/13/2019   Hyponatremia 09/27/2019   Closed fracture of right proximal humerus 04/20/2019   Surgery, elective    Seizure ([place]) 11/12/2018   History of hydronephrosis --stents in place 12/08/2017   Stroke ([place]) 11/29/2017   Seizures  ([place])    Hydronephrosis    Recurrent UTI    Diabetes mellitus type 2 in nonobese (HCC)    ESRD (end stage renal disease) ([place])    Anemia of chronic disease    Chronic diastolic congestive heart failure ([place])    Essential hypertension    PICC (peripherally inserted central catheter) in place    Acute pyelonephritis 11/12/2017   Uncontrolled type 2 diabetes mellitus with hyperglycemia, with long-term current use of insulin ([place]) 11/10/2017   Renal failure 11/08/2017   Seizure disorder ([place]) 11/08/2017   Orthostatic hypotension 07/25/2014   Moderate protein malnutrition ([place]) 09/15/2013   Aspiration pneumonia ([place]) 09/15/2013   Acute respiratory failure requiring reintubation ([place]) 09/11/2013   probable Seizures due to metabolic disorder 59/56/3875   Lactic acidosis 03/19/2013   Abdominal pain 03/19/2013   Rotavirus infection 10/29/2012   Diabetes mellitus with end-stage renal disease ([place]) 10/29/2012   Protein-calorie malnutrition, severe ([place]) 10/27/2012   NSTEMI (non-ST elevated myocardial infarction) ([place]) 10/26/2012   Fever, unspecified 10/26/2012   Hypotension 64/33/2951   Metabolic acidosis 88/41/6606   Chronic diarrhea 10/25/2012   Tobacco abuse 10/25/2012   DKA (diabetic ketoacidoses) 09/09/2012   Dehydration 09/09/2012   DKA, type 2 ([place]) 05/20/2012   Abnormal LFTs 05/20/2012   Heart  murmur, systolic 16/11/9602   Hypoglycemia 54/10/8117   Metabolic encephalopathy 14/78/2956   Alcohol abuse 07/08/2011   Hypokalemia 07/08/2011   Nausea & vomiting 07/08/2011   H/O chronic pancreatitis 07/08/2011    Past Surgical History:  Procedure Laterality Date   AV FISTULA PLACEMENT Left 12/15/2017   Procedure: INSERTION OF ARTERIOVENOUS (AV) GORE-TEX GRAFT ARM;  Surgeon: Rosetta Posner, MD;  Location: [facility] OR;  Service: Vascular;  Laterality: Left;   CATARACT EXTRACTION W/PHACO Right 03/14/2015   Procedure: CATARACT EXTRACTION PHACO AND INTRAOCULAR LENS PLACEMENT ([place]);  Surgeon:  Baruch Goldmann, MD;  Location: [facility];  Service: Ophthalmology;  Laterality: Right;  CDE:11.13   CATARACT EXTRACTION W/PHACO Left 04/11/2015   Procedure: CATARACT EXTRACTION PHACO AND INTRAOCULAR LENS PLACEMENT LEFT EYE CDE=9.68;  Surgeon: Baruch Goldmann, MD;  Location: [facility];  Service: Ophthalmology;  Laterality: Left;   COLONOSCOPY  02/24/2010   cystoscopy with ureteral stent  Bilateral 10/07/2017   At [place] CV LINE RIGHT  11/17/2017   IR FLUORO GUIDE CV LINE RIGHT  11/22/2017   IR REMOVAL TUN CV CATH W/O FL  01/26/2018   IR US GUIDE VASC ACCESS RIGHT  11/17/2017   MULTIPLE EXTRACTIONS WITH ALVEOLOPLASTY N/A 06/13/2012   Procedure: MULTIPLE EXTRACION WITH ALVEOLOPLASTY EXTRACT: 18, 19, 20, 21, 22, 24, 25, 27, 28, 29, 30, 31;  Surgeon: Gae Bon, DDS;  Location: [place];  Service: Oral Surgery;  Laterality: N/A;   ORIF HUMERUS FRACTURE Right 04/20/2019   Procedure: OPEN REDUCTION INTERNAL FIXATION (ORIF) PROXIMAL HUMERUS FRACTURE;  Surgeon: Marchia Bond, MD;  Location: [place];  Service: Orthopedics;  Laterality: Right;   TUBAL LIGATION     URETERAL STENT PLACEMENT  09/2017     OB History     Gravida      Para      Term      Preterm      AB      Living  2      SAB      IAB      Ectopic      Multiple      Live Births              Family History  Problem Relation Age of Onset   Diabetes Sister    Chronic Renal Failure Neg Hx     Social History   Tobacco Use   Smoking status: Never   Smokeless tobacco: Current    Types: Snuff  Vaping Use   Vaping Use: Never used  Substance Use Topics   Alcohol use: Not Currently    Alcohol/week: 1.0 standard drink    Types: 1 Cans of beer per week   Drug use: No    Home Medications Prior to Admission medications   Medication Sig Start Date End Date Taking? Authorizing Provider  acetaminophen (TYLENOL) 325 MG tablet Take 650 mg by mouth every 4 (four) hours as needed for moderate pain.    Yes [provider]  albuterol (VENTOLIN HFA) 108 (90 Base) MCG/ACT inhaler Inhale 2 puffs into the lungs every 4 (four) hours as needed for wheezing or shortness of breath.    Yes [provider]  alum & mag hydroxide-simeth (MAALOX/MYLANTA) 200-200-20 MG/5ML suspension Take 30 mLs by mouth with breakfast, with lunch, and with evening meal.   Yes [provider]  bacitracin 500 UNIT/GM ointment Apply 1 application topically See admin instructions. Apply 1 application to raised areas on  back of head topically twice a day for infection   Yes [provider]  Biotin 10 MG TABS Take 10 mg by mouth daily.   Yes [provider]  carvedilol (COREG) 12.5 MG tablet Take 1 tablet (12.5 mg total) by mouth 2 (two) times daily. 09/24/20  Yes Johnson, Clanford L, MD  ferric citrate (AURYXIA) 1 GM 210 MG(Fe) tablet Take 2 tablets (420 mg total) by mouth 3 (three) times daily with meals. 11/15/18  Yes Buriev, Arie Sabina, MD  folic acid (FOLVITE) 1 MG tablet Take 1 mg by mouth every morning.   Yes [provider]  HYDROcodone-acetaminophen (NORCO/VICODIN) 5-325 MG tablet Take 1 tablet by mouth every 4 (four) hours as needed. 01/12/21  Yes Isla Pence, MD  hydrOXYzine (ATARAX/VISTARIL) 25 MG tablet Take 25 mg by mouth every 12 (twelve) hours as needed. 12/27/20  Yes [provider]  insulin aspart (NOVOLOG) 100 UNIT/ML injection Inject 1-3 Units into the skin See admin instructions. Inject per sliding scale  If BS 151-250= 1 unit 251- 350= 2 units 351-400= 3 units > 400= 3 units Give subcutaneous 4 times a day   Yes [provider]  LEVEMIR 100 UNIT/ML injection Inject 6 Units into the skin daily. 11/14/20  Yes [provider]  levETIRAcetam (KEPPRA) 500 MG tablet Take 500 mg by mouth at bedtime.   Yes [provider]  loperamide (IMODIUM) 2 MG capsule Take 4 mg by mouth daily as needed for diarrhea or loose stools.   Yes  [provider]  NOVOLOG 100 UNIT/ML injection Inject 2 Units into the skin See admin instructions. Inject 2 units subcutaneously before meals, Hold if BS is <150 mg/dl 12/30/20  Yes [provider]  pantoprazole (PROTONIX) 40 MG tablet Take 1 tablet (40 mg total) by mouth daily. 11/19/19 01/12/21 Yes Oswald Hillock, MD  traZODone (DESYREL) 150 MG tablet Take 150 mg by mouth at bedtime.   Yes [provider]  TRULICITY 0.71 QR/9.7JO SOPN Inject 0.75 mg into the skin See admin instructions. Inject 0.75 mg once a week on Thursday 01/01/21  Yes [provider]  calcitRIOL (ROCALTROL) 0.25 MCG capsule Take 1 capsule (0.25 mcg total) by mouth Every Tuesday,Thursday,and Saturday with dialysis. Patient not taking: Reported on 01/12/2021 12/18/17   Love, Ivan Anchors, PA-C  cinacalcet (SENSIPAR) 30 MG tablet Take 30 mg by mouth every evening. Patient not taking: Reported on 01/12/2021    [provider]  insulin lispro (HUMALOG) 100 UNIT/ML injection Inject 4-7 Units into the skin in the morning, at noon, in the evening, and at bedtime. Per sliding scale Patient not taking: Reported on 01/12/2021    [provider]  promethazine (PHENERGAN) 25 MG tablet Take 25 mg by mouth every 8 (eight) hours as needed for nausea or vomiting. Patient not taking: Reported on 01/12/2021 07/16/20   [provider]    Allergies    Aspirin and Metformin and related  Review of Systems   Review of Systems  Musculoskeletal:        Right hand pain  Skin:        itching  All other systems reviewed and are negative.  Physical Exam Updated Vital Signs BP 138/61   Pulse 74   Ht 5' (1.524 m)   Wt 45.5 kg   SpO2 100%   BMI 19.59 kg/m   Physical Exam Vitals and nursing note reviewed.  Constitutional:      Appearance: Normal appearance.  HENT:  Head: Normocephalic and atraumatic.     Right Ear: External ear normal.     Left Ear: External ear normal.      Nose: Nose normal.     Mouth/Throat:     Mouth: Mucous membranes are dry.  Eyes:     Extraocular Movements: Extraocular movements intact.     Conjunctiva/sclera: Conjunctivae normal.     Pupils: Pupils are equal, round, and reactive to light.  Cardiovascular:     Rate and Rhythm: Normal rate and regular rhythm.     Pulses: Normal pulses.     Heart sounds: Normal heart sounds.  Pulmonary:     Effort: Pulmonary effort is normal.     Breath sounds: Normal breath sounds.  Abdominal:     General: Abdomen is flat. Bowel sounds are normal.     Palpations: Abdomen is soft.  Musculoskeletal:       Hands:     Cervical back: Normal range of motion and neck supple.  Skin:    General: Skin is dry.     Capillary Refill: Capillary refill takes less than 2 seconds.  Neurological:     Mental Status: She is alert and oriented to person, place, and time.  Psychiatric:        Mood and Affect: Mood normal.        Behavior: Behavior normal.    ED Results / Procedures / Treatments   Labs (all labs ordered are listed, but only abnormal results are displayed) Labs Reviewed  COMPREHENSIVE METABOLIC PANEL - Abnormal; Notable for the following components:      Result Value   Sodium 128 (*)    Chloride 94 (*)    Glucose, Bld 228 (*)    BUN 45 (*)    Creatinine, Ser 5.32 (*)    AST 56 (*)    ALT 49 (*)    Alkaline Phosphatase 220 (*)    GFR, Estimated 8 (*)    All other components within normal limits  CBC WITH DIFFERENTIAL/PLATELET - Abnormal; Notable for the following components:   RBC 2.52 (*)    Hemoglobin 9.0 (*)    HCT 28.2 (*)    MCV 111.9 (*)    MCH 35.7 (*)    Platelets 129 (*)    All other components within normal limits    EKG None  Radiology DG Hand Complete Right  Result Date: 01/12/2021 CLINICAL DATA:  Right lateral hand pain. EXAM: RIGHT HAND - COMPLETE 3+ VIEW COMPARISON:  None. FINDINGS: Diffusely decreased bone density. Acute minimally displaced fracture of the  fifth metacarpal neck which extends into the metacarpal body. There is no evidence of arthropathy or other focal bone abnormality. Associated lateral hand subcutaneus soft tissue edema. Diffuse vascular calcifications. IMPRESSION: 1. Acute minimally displaced fracture of the fifth metacarpal neck which extends into the metacarpal body. 2. Diffusely decreased bone density. 3. Diffuse vascular calcifications. Electronically Signed   By: Iven Finn M.D.   On: 01/12/2021 15:36    Procedures .Ortho Injury Treatment  Date/Time: 01/12/2021 5:37 PM Performed by: Isla Pence, MD Authorized by: Isla Pence, MD   Consent:    Consent obtained:  Verbal   Risks discussed:  Fracture   Alternatives discussed:  No treatmentInjury location: hand Location details: right hand Injury type: fracture Pre-procedure neurovascular assessment: neurovascularly intact Manipulation performed: no Immobilization: splint Splint type: ulnar gutter Splint Applied by: ED Tech Supplies used: cotton padding, elastic bandage and Ortho-Glass Post-procedure neurovascular assessment: post-procedure neurovascularly intact  Medications Ordered in ED Medications  diphenhydrAMINE (BENADRYL) capsule 25 mg (25 mg Oral Given 01/12/21 1639)    ED Course  I have reviewed the triage vital signs and the nursing notes.  Pertinent labs & imaging results that were available during my care of the patient were reviewed by me and considered in my medical decision making (see chart for details).    MDM Rules/Calculators/A&P                           Pt has a 5th mt fx.  She is placed in an ulna gutter splint.  She is to f/u with Dr. Amedeo Kinsman.  Pruritis is likely due to ESRD.  Pt is to return if worse.  Final Clinical Impression(s) / ED Diagnoses Final diagnoses:  Closed displaced fracture of neck of fifth metacarpal bone of right hand, initial encounter  ESRD on hemodialysis ([place])  Pruritus    Rx / DC Orders ED  Discharge Orders          Ordered    HYDROcodone-acetaminophen (NORCO/VICODIN) 5-325 MG tablet  Every 4 hours PRN        01/12/21 1736             Isla Pence, MD 01/12/21 1738

## 2021-01-12 NOTE — ED Notes (Signed)
[facility] called to set up transportation at this time.

## 2021-01-21 ENCOUNTER — Ambulatory Visit: Payer: Medicare Other | Admitting: Orthopedic Surgery

## 2021-01-24 ENCOUNTER — Ambulatory Visit (INDEPENDENT_AMBULATORY_CARE_PROVIDER_SITE_OTHER): Payer: Medicare Other | Admitting: Orthopedic Surgery

## 2021-01-24 ENCOUNTER — Encounter: Payer: Self-pay | Admitting: Orthopedic Surgery

## 2021-01-24 ENCOUNTER — Ambulatory Visit: Payer: Medicare Other

## 2021-01-24 ENCOUNTER — Other Ambulatory Visit: Payer: Self-pay

## 2021-01-24 VITALS — Ht 60.0 in | Wt 100.0 lb

## 2021-01-24 DIAGNOSIS — S62336A Displaced fracture of neck of fifth metacarpal bone, right hand, initial encounter for closed fracture: Secondary | ICD-10-CM

## 2021-01-24 NOTE — Progress Notes (Signed)
New Patient Visit  Assessment: Erica [name] is a 65 y.o. female with the following: 1. Closed displaced fracture of neck of fifth metacarpal bone of right hand, initial encounter  Plan: Patient sustained a fall, injuring her right fifth metacarpal approximately 3 weeks ago.  Repeat x-rays today demonstrates excellent overall alignment.  This is amenable to nonoperative management.  Upon removal of the splint, she continues to have pain at the site of the fracture.  We will plan to transition her to an ulnar gutter cast, and see her back in approximately 2-3 weeks.   Cast application - right ulnar gutter cast   Verbal consent was obtained and the correct extremity was identified. A well padded, appropriately molded ulnar gutter cast was applied to the right arm Fingers remained warm and well perfused.   There were no sharp edges Patient tolerated the procedure well Cast care instructions were provided    Follow-up: Return in about 2 weeks (around 02/07/2021).  Subjective:  Chief Complaint  Patient presents with   Fracture    Rt hand DOI    History of Present Illness: Erica [name] is a 65 y.o. female who presents for evaluation of right hand pain.  She fell approximately 3 weeks ago, and sustained an injury to her right hand.  She was seen in the emergency department, and x-rays demonstrated a minimally displaced fracture of the fifth metacarpal.  She was placed in a splint.  Since then, she is doing well.  She continues to have pain in the right side of her hand.  She does have some baseline numbness associated with neuropathy.  She is a resident at [facility].   Review of Systems: No fevers or chills Bilateral hand numbness No chest pain No shortness of breath No bowel or bladder dysfunction No GI distress No headaches   Medical History:  Past Medical History:  Diagnosis Date   Alcohol-induced pancreatitis    CAD (coronary artery disease) 09/17/2020   Chronic  diarrhea    Depression    Diabetes mellitus    fasting blood sugar 110-120s   Dialysis patient ([place])    Diastolic CHF ([place])    DKA (diabetic ketoacidoses)    Gastroparesis    GERD (gastroesophageal reflux disease)    Heart murmur    History of kidney stones    Hyperlipidemia    Hypertension    Hypokalemia    Muscle spasm    Neuropathic pain    Neuropathy    Hx: of   Pyelonephritis    Seizures ([place])    Vitamin B12 deficiency    Vitamin D deficiency     Past Surgical History:  Procedure Laterality Date   AV FISTULA PLACEMENT Left 12/15/2017   Procedure: INSERTION OF ARTERIOVENOUS (AV) GORE-TEX GRAFT ARM;  Surgeon: Rosetta Posner, MD;  [facility];  Service: Vascular;  Laterality: Left;   CATARACT EXTRACTION W/PHACO Right 03/14/2015   Procedure: CATARACT EXTRACTION PHACO AND INTRAOCULAR LENS PLACEMENT ([place]);  Surgeon: Baruch Goldmann, MD;  Location: [facility];  Service: Ophthalmology;  Laterality: Right;  CDE:11.13   CATARACT EXTRACTION W/PHACO Left 04/11/2015   Procedure: CATARACT EXTRACTION PHACO AND INTRAOCULAR LENS PLACEMENT LEFT EYE CDE=9.68;  Surgeon: Baruch Goldmann, MD;  Location: [facility];  Service: Ophthalmology;  Laterality: Left;   COLONOSCOPY  02/24/2010   cystoscopy with ureteral stent  Bilateral 10/07/2017   At [place] CV LINE RIGHT  11/17/2017   IR FLUORO GUIDE CV  LINE RIGHT  11/22/2017   IR REMOVAL TUN CV CATH W/O FL  01/26/2018   IR US GUIDE VASC ACCESS RIGHT  11/17/2017   MULTIPLE EXTRACTIONS WITH ALVEOLOPLASTY N/A 06/13/2012   Procedure: MULTIPLE EXTRACION WITH ALVEOLOPLASTY EXTRACT: 18, 19, 20, 21, 22, 24, 25, 27, 28, 29, 30, 31;  Surgeon: Gae Bon, DDS;  Location[place];  Service: Oral Surgery;  Laterality: N/A;   ORIF HUMERUS FRACTURE Right 04/20/2019   Procedure: OPEN REDUCTION INTERNAL FIXATION (ORIF) PROXIMAL HUMERUS FRACTURE;  Surgeon: Marchia Bond, MD;  Location: [place];  Service: Orthopedics;  Laterality: Right;   TUBAL  LIGATION     URETERAL STENT PLACEMENT  09/2017    Family History  Problem Relation Age of Onset   Diabetes Sister    Chronic Renal Failure Neg Hx    Social History   Tobacco Use   Smoking status: Never   Smokeless tobacco: Current    Types: Snuff  Vaping Use   Vaping Use: Never used  Substance Use Topics   Alcohol use: Not Currently    Alcohol/week: 1.0 standard drink    Types: 1 Cans of beer per week   Drug use: No    Allergies  Allergen Reactions   Aspirin Palpitations and Other (See Comments)    Listed on [facility] 11/12/18   Metformin And Related     No outpatient medications have been marked as taking for the 01/24/21 encounter (Office Visit) with Mordecai Rasmussen, MD.    Objective: Ht 5' (1.524 m)   Wt 100 lb (45.4 kg)   BMI 19.53 kg/m   Physical Exam:  General: Elderly female., Alert and oriented., and Seated in a wheelchair.  Evaluation of the right hand demonstrates bruising on the ulnar aspect of the hand.  She is tenderness to palpation along the fifth metacarpal.  Minimal range of motion due to pain.  Fingers are warm and well-perfused.  Decreased sensation to all fingers.    IMAGING: I personally ordered and reviewed the following images  X-rays of the right hand were obtained in clinic today, compared to previous x-rays.  She has a mildly angulated distal fifth metacarpal fracture.  No acute dislocation.  No other injuries are noted.  Overall bone quality is poor.  Impression: Right fifth metacarpal fracture in acceptable alignment.   New Medications:  No orders of the defined types were placed in this encounter.     Mordecai Rasmussen, MD  01/24/2021 9:55 AM

## 2021-01-24 NOTE — Patient Instructions (Signed)
General Cast Instructions  1.  You were placed in a cast in clinic today.  Please keep the cast material clean, dry and intact.  Please do not use anything to itch the under the cast.  If it gets itchy, you can consider taking benadryl, or similar medication.  If the cast material gets wet, place it on a towel and use a hair dryer on a low setting. 2.  Tylenol or Ibuprofen/Naproxen as needed.   3.  Recommend elevating your extremity as much as possible to help with swelling. 4.  F/u 2 weeks, cast off and repeat XR  

## 2021-02-07 ENCOUNTER — Ambulatory Visit: Payer: Medicare Other

## 2021-02-07 ENCOUNTER — Encounter: Payer: Self-pay | Admitting: Orthopedic Surgery

## 2021-02-07 ENCOUNTER — Ambulatory Visit (INDEPENDENT_AMBULATORY_CARE_PROVIDER_SITE_OTHER): Payer: Medicare Other | Admitting: Orthopedic Surgery

## 2021-02-07 ENCOUNTER — Other Ambulatory Visit: Payer: Self-pay

## 2021-02-07 DIAGNOSIS — S62336A Displaced fracture of neck of fifth metacarpal bone, right hand, initial encounter for closed fracture: Secondary | ICD-10-CM

## 2021-02-07 DIAGNOSIS — S62336D Displaced fracture of neck of fifth metacarpal bone, right hand, subsequent encounter for fracture with routine healing: Secondary | ICD-10-CM | POA: Diagnosis not present

## 2021-02-07 NOTE — Progress Notes (Signed)
Orthopaedic Postop Note  Assessment: Erica [name] is a 65 y.o. female s/p right 5th metacarpal shaft fracture  DOI: 01/12/21  Plan: XR stable No pain Can almost make a full fist ROM as tolerated Avoid power gripping x 2-3 weeks No lifting greater than a coffee cup for 2-3 weeks Follow up as needed  Follow-up: Return if symptoms worsen or fail to improve. XR at next visit: As needed  Subjective:  Chief Complaint  Patient presents with   fracture care    DOI 01/12/21 RT hand Closed displaced fracture of neck of fifth metacarpal bone of right hand    History of Present Illness: Erica [name] is a 65 y.o. female who presents following the above stated injury.  She sustained an injury to her right hand approximately 4 weeks ago.  She was seen in clinic and placed in a cast 2 weeks ago.  She is tolerated the cast well.  Pain is controlled.  No issues since the last visit.  Review of Systems: No fevers or chills No numbness or tingling No Chest Pain No shortness of breath   Objective: There were no vitals taken for this visit.  Physical Exam:  Alert and oriented.  No acute distress.  Seated in a wheelchair.  Upon removal of the cast, there is no skin breakdown.  Mild swelling over the ulnar border of the hand is appreciated.  No tenderness to palpation in this area.  No bruising is appreciated.  She is almost able to make a full fist.  She has good grip strength with the radial aspect of her hand.  Fingers are warm and well-perfused.  IMAGING: I personally ordered and reviewed the following images:  X-rays of the right hand were obtained in clinic today.  There is a mildly angulated fracture of the shaft of the fifth metacarpal.  Overall alignment remains unchanged.  No interval displacement.  There is some interval consolidation of the fracture site.  Impression: Healing fifth metacarpal fracture in acceptable alignment.  Erica Rasmussen,  MD 02/07/2021 9:48 AM

## 2021-03-13 ENCOUNTER — Ambulatory Visit: Payer: Medicare Other | Admitting: "Endocrinology

## 2021-06-06 ENCOUNTER — Emergency Department (HOSPITAL_COMMUNITY)
Admission: EM | Admit: 2021-06-06 | Discharge: 2021-06-07 | Disposition: A | Discharge status: Home / Self Care | Payer: Medicare Other | Attending: Emergency Medicine | Admitting: Emergency Medicine

## 2021-06-06 ENCOUNTER — Encounter (HOSPITAL_COMMUNITY): Payer: Self-pay

## 2021-06-06 ENCOUNTER — Other Ambulatory Visit: Payer: Self-pay

## 2021-06-06 DIAGNOSIS — I10 Essential (primary) hypertension: Secondary | ICD-10-CM

## 2021-06-06 DIAGNOSIS — E1122 Type 2 diabetes mellitus with diabetic chronic kidney disease: Secondary | ICD-10-CM | POA: Diagnosis not present

## 2021-06-06 DIAGNOSIS — Z79899 Other long term (current) drug therapy: Secondary | ICD-10-CM | POA: Diagnosis not present

## 2021-06-06 DIAGNOSIS — I120 Hypertensive chronic kidney disease with stage 5 chronic kidney disease or end stage renal disease: Secondary | ICD-10-CM | POA: Diagnosis not present

## 2021-06-06 DIAGNOSIS — Z992 Dependence on renal dialysis: Secondary | ICD-10-CM | POA: Insufficient documentation

## 2021-06-06 DIAGNOSIS — N186 End stage renal disease: Secondary | ICD-10-CM | POA: Diagnosis not present

## 2021-06-06 DIAGNOSIS — I2510 Atherosclerotic heart disease of native coronary artery without angina pectoris: Secondary | ICD-10-CM | POA: Diagnosis not present

## 2021-06-06 DIAGNOSIS — R519 Headache, unspecified: Secondary | ICD-10-CM

## 2021-06-06 DIAGNOSIS — Z794 Long term (current) use of insulin: Secondary | ICD-10-CM | POA: Insufficient documentation

## 2021-06-06 DIAGNOSIS — D631 Anemia in chronic kidney disease: Secondary | ICD-10-CM | POA: Insufficient documentation

## 2021-06-06 NOTE — ED Triage Notes (Signed)
Pt brought in by [facility] for c/o H/A, generalized weakness, and HTN (195/76). Pt is dialysis pt and last HD was yesterday. ?

## 2021-06-06 NOTE — ED Triage Notes (Signed)
Pt reports missing Dialysis appointment on Tuesday.  ?

## 2021-06-07 ENCOUNTER — Emergency Department (HOSPITAL_COMMUNITY): Payer: Medicare Other

## 2021-06-07 DIAGNOSIS — I120 Hypertensive chronic kidney disease with stage 5 chronic kidney disease or end stage renal disease: Secondary | ICD-10-CM | POA: Diagnosis not present

## 2021-06-07 LAB — CBC WITH DIFFERENTIAL/PLATELET
Abs Immature Granulocytes: 0.01 10*3/uL (ref 0.00–0.07)
Basophils Absolute: 0 10*3/uL (ref 0.0–0.1)
Basophils Relative: 1 %
Eosinophils Absolute: 0.1 10*3/uL (ref 0.0–0.5)
Eosinophils Relative: 3 %
HCT: 37.7 % (ref 36.0–46.0)
Hemoglobin: 11.8 g/dL — ABNORMAL LOW (ref 12.0–15.0)
Immature Granulocytes: 0 %
Lymphocytes Relative: 38 %
Lymphs Abs: 1.7 10*3/uL (ref 0.7–4.0)
MCH: 31.6 pg (ref 26.0–34.0)
MCHC: 31.3 g/dL (ref 30.0–36.0)
MCV: 101.1 fL — ABNORMAL HIGH (ref 80.0–100.0)
Monocytes Absolute: 0.6 10*3/uL (ref 0.1–1.0)
Monocytes Relative: 13 %
Neutro Abs: 2.1 10*3/uL (ref 1.7–7.7)
Neutrophils Relative %: 45 %
Platelets: 120 10*3/uL — ABNORMAL LOW (ref 150–400)
RBC: 3.73 MIL/uL — ABNORMAL LOW (ref 3.87–5.11)
RDW: 14 % (ref 11.5–15.5)
WBC: 4.6 10*3/uL (ref 4.0–10.5)
nRBC: 0 % (ref 0.0–0.2)

## 2021-06-07 LAB — BASIC METABOLIC PANEL
Anion gap: 8 (ref 5–15)
BUN: 16 mg/dL (ref 8–23)
CO2: 29 mmol/L (ref 22–32)
Calcium: 9.2 mg/dL (ref 8.9–10.3)
Chloride: 99 mmol/L (ref 98–111)
Creatinine, Ser: 4.77 mg/dL — ABNORMAL HIGH (ref 0.44–1.00)
GFR, Estimated: 10 mL/min — ABNORMAL LOW (ref >60)
Glucose, Bld: 161 mg/dL — ABNORMAL HIGH (ref 70–99)
Potassium: 4.3 mmol/L (ref 3.5–5.1)
Sodium: 136 mmol/L (ref 135–145)

## 2021-06-07 LAB — MAGNESIUM: Magnesium: 2 mg/dL (ref 1.7–2.4)

## 2021-06-07 MED ORDER — LACTATED RINGERS IV BOLUS
500.0000 mL | Freq: Once | INTRAVENOUS | Status: AC
  Administered 2021-06-07: 500 mL via INTRAVENOUS

## 2021-06-07 MED ORDER — DEXAMETHASONE SODIUM PHOSPHATE 10 MG/ML IJ SOLN
10.0000 mg | Freq: Once | INTRAMUSCULAR | Status: AC
  Administered 2021-06-07: 10 mg via INTRAVENOUS
  Filled 2021-06-07: qty 1

## 2021-06-07 MED ORDER — PROCHLORPERAZINE EDISYLATE 10 MG/2ML IJ SOLN
10.0000 mg | Freq: Once | INTRAMUSCULAR | Status: AC
  Administered 2021-06-07: 10 mg via INTRAVENOUS
  Filled 2021-06-07: qty 2

## 2021-06-07 NOTE — ED Notes (Signed)
RN called Pts son to arrange for transportation for Pt to be discharged home so she can make her Dialysis appt this morning per Pt's request. Pts son [name] agreed to find someone to come pick her up.  ?

## 2021-06-07 NOTE — ED Provider Notes (Addendum)
[facility] EMERGENCY DEPARTMENT Provider Note   CSN: [id number] Arrival date & time: 06/06/21  2039     History  Chief Complaint  Patient presents with   Migraine    Erica [name] is a 66 y.o. female.  The history is provided by the patient.  Migraine She has history of hypertension, diabetes, coronary artery disease, end-stage renal disease on hemodialysis and comes in because of a global headache for the last 5 days.  She has taken acetaminophen for her headache without relief.  There has been some nausea but no vomiting.  She has not noticed any visual disturbance.  She complains of feeling generally weak, but she denies focal weakness, numbness, tingling.  She denies photophobia or phonophobia.  Last dialysis was yesterday and she reportedly missed her dialysis session 3 days ago.   Home Medications Prior to Admission medications   Medication Sig Start Date End Date Taking? Authorizing Provider  acetaminophen (TYLENOL) 325 MG tablet Take 650 mg by mouth every 4 (four) hours as needed for moderate pain.    [provider]  albuterol (VENTOLIN HFA) 108 (90 Base) MCG/ACT inhaler Inhale 2 puffs into the lungs every 4 (four) hours as needed for wheezing or shortness of breath.     [provider]  alum & mag hydroxide-simeth (MAALOX/MYLANTA) 200-200-20 MG/5ML suspension Take 30 mLs by mouth with breakfast, with lunch, and with evening meal.    [provider]  bacitracin 500 UNIT/GM ointment Apply 1 application topically See admin instructions. Apply 1 application to raised areas on back of head topically twice a day for infection    [provider]  Biotin 10 MG TABS Take 10 mg by mouth daily.    [provider]  calcitRIOL (ROCALTROL) 0.25 MCG capsule Take 1 capsule (0.25 mcg total) by mouth Every Tuesday,Thursday,and Saturday with dialysis. 12/18/17   Love, Evlyn Kanner, PA-C  carvedilol (COREG) 12.5 MG tablet Take 1 tablet (12.5 mg total)  by mouth 2 (two) times daily. 09/24/20   Johnson, Clanford L, MD  cinacalcet (SENSIPAR) 30 MG tablet Take 30 mg by mouth every evening.    [provider]  ferric citrate (AURYXIA) 1 GM 210 MG(Fe) tablet Take 2 tablets (420 mg total) by mouth 3 (three) times daily with meals. 11/15/18   Esperanza Sheets, MD  folic acid (FOLVITE) 1 MG tablet Take 1 mg by mouth every morning.    [provider]  HYDROcodone-acetaminophen (NORCO/VICODIN) 5-325 MG tablet Take 1 tablet by mouth every 4 (four) hours as needed. 01/12/21   Jacalyn Lefevre, MD  hydrOXYzine (ATARAX/VISTARIL) 25 MG tablet Take 25 mg by mouth every 12 (twelve) hours as needed. 12/27/20   [provider]  insulin aspart (NOVOLOG) 100 UNIT/ML injection Inject 1-3 Units into the skin See admin instructions. Inject per sliding scale  If BS 151-250= 1 unit 251- 350= 2 units 351-400= 3 units > 400= 3 units Give subcutaneous 4 times a day    [provider]  insulin lispro (HUMALOG) 100 UNIT/ML injection Inject 4-7 Units into the skin in the morning, at noon, in the evening, and at bedtime. Per sliding scale    [provider]  LEVEMIR 100 UNIT/ML injection Inject 6 Units into the skin daily. 11/14/20   [provider]  levETIRAcetam (KEPPRA) 500 MG tablet Take 500 mg by mouth at bedtime.    [provider]  loperamide (IMODIUM) 2 MG capsule Take 4 mg by mouth daily as needed  for diarrhea or loose stools.    [provider]  NOVOLOG 100 UNIT/ML injection Inject 2 Units into the skin See admin instructions. Inject 2 units subcutaneously before meals, Hold if BS is <150 mg/dl 25/95/63   [provider]  pantoprazole (PROTONIX) 40 MG tablet Take 1 tablet (40 mg total) by mouth daily. 11/19/19 01/12/21  Meredeth Ide, MD  promethazine (PHENERGAN) 25 MG tablet Take 25 mg by mouth every 8 (eight) hours as needed for nausea or vomiting. 07/16/20   [provider]   traZODone (DESYREL) 150 MG tablet Take 150 mg by mouth at bedtime.    [provider]  TRULICITY 0.75 MG/0.5ML SOPN Inject 0.75 mg into the skin See admin instructions. Inject 0.75 mg once a week on Thursday 01/01/21   [provider]      Allergies    Aspirin and Metformin and related    Review of Systems   Review of Systems  All other systems reviewed and are negative.  Physical Exam Updated Vital Signs BP (!) 174/84 (BP Location: Right Arm)   Pulse 71   Temp 97.7 F (36.5 C) (Oral)   Resp 18   Ht 5' (1.524 m)   Wt 40.4 kg   SpO2 99%   BMI 17.38 kg/m  Physical Exam Vitals and nursing note reviewed.  66 year old female, resting comfortably and in no acute distress. Vital signs are significant for elevated blood pressure. Oxygen saturation is 99%, which is normal. Head is normocephalic and atraumatic. PERRLA, EOMI. Oropharynx is clear.  Fundi are poorly visualized. Neck is nontender and supple without adenopathy or JVD. Back is nontender and there is no CVA tenderness. Lungs are clear without rales, wheezes, or rhonchi. Chest is nontender. Heart has regular rate and rhythm with 2-3/6 systolic ejection murmur heard along the left sternal border. Abdomen is soft, flat, nontender without masses or hepatosplenomegaly and peristalsis is normoactive. Extremities have no cyanosis or edema, full range of motion is present.  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 Skin is warm and dry without rash. Neurologic: Mental status is normal, cranial nerves are intact, strength is 5/5 in all 4 extremities.  ED Results / Procedures / Treatments   Labs (all labs ordered are listed, but only abnormal results are displayed) Labs Reviewed  BASIC METABOLIC PANEL - Abnormal; Notable for the following components:      Result Value   Glucose, Bld 161 (*)    Creatinine, Ser 4.77 (*)    GFR, Estimated 10 (*)    All other components within normal limits   CBC WITH DIFFERENTIAL/PLATELET - Abnormal; Notable for the following components:   RBC 3.73 (*)    Hemoglobin 11.8 (*)    MCV 101.1 (*)    Platelets 120 (*)    All other components within normal limits  MAGNESIUM   Imaging studies CT Head Wo Contrast  Result Date: 06/07/2021 CLINICAL DATA:  Headache EXAM: CT HEAD WITHOUT CONTRAST TECHNIQUE: Contiguous axial images were obtained from the base of the skull through the vertex without intravenous contrast. RADIATION DOSE REDUCTION: This exam was performed according to the departmental dose-optimization program which includes automated exposure control, adjustment of the mA and/or kV according to patient size and/or use of iterative reconstruction technique. COMPARISON:  08/15/2020 FINDINGS: Brain: There is no mass, hemorrhage or extra-axial collection. There is generalized atrophy without lobar predilection. Hypodensity of the white matter is most commonly associated with chronic microvascular  disease. Vascular: Atherosclerotic calcification of the internal carotid arteries at the skull base. No abnormal hyperdensity of the major intracranial arteries or dural venous sinuses. Skull: The visualized skull base, calvarium and extracranial soft tissues are normal. Sinuses/Orbits: No fluid levels or advanced mucosal thickening of the visualized paranasal sinuses. No mastoid or middle ear effusion. The orbits are normal. IMPRESSION: 1. No acute intracranial abnormality. 2. Chronic microvascular ischemia and generalized atrophy. Electronically Signed   By: Deatra Robinson M.D.   On: 06/07/2021 02:52     Procedures Procedures    Medications Ordered in ED Medications  dexamethasone (DECADRON) injection 10 mg (has no administration in time range)  lactated ringers bolus 500 mL (500 mLs Intravenous New Bag/Given 06/07/21 0059)  prochlorperazine (COMPAZINE) injection 10 mg (10 mg Intravenous Given 06/07/21 0059)    ED Course/ Medical Decision Making/ A&P                            Medical Decision Making Amount and/or Complexity of Data Reviewed Labs: ordered. Radiology: ordered.  Risk Prescription drug management.   Headache and dialysis patient, normal neurologic exam.  Differential diagnosis includes tension headache, migraine variant, subarachnoid hemorrhage,, meningitis.  With headache present for 5 days and not showing any evidence of fever or toxicity, doubt subarachnoid hemorrhage or meningitis.  She will be sent for CT of the head and she will be given a migraine cocktail of lactated Ringer solution and prochlorperazine.  We will also check electrolytes and magnesium levels.  Old records are reviewed, and I see no prior ED visits for headaches  Following above-noted treatment, headache has completely resolved  Labs showed no evidence hyperkalemia or hypermagnesemia.  Chronic anemia is present which is improved compared with baseline, presumably secondary to end-stage renal disease.  CT shows no acute process.  I have independently viewed the images, and agree with the radiologist's interpretation.  She is given a dose of dexamethasone to try to prevent a rebound headache.  She is discharged with instructions to go for her scheduled dialysis session later today.  Final Clinical Impression(s) / ED Diagnoses Final diagnoses:  Bad headache  Elevated blood pressure reading with diagnosis of hypertension  End-stage renal disease on hemodialysis (HCC)  Anemia associated with chronic renal failure    Rx / DC Orders ED Discharge Orders     None         Dione Booze, MD 06/07/21 1610    Dione Booze, MD 06/07/21 [phone]

## 2021-06-07 NOTE — Discharge Instructions (Signed)
Make sure to go for your dialysis session in the morning, as scheduled. ?

## 2021-06-09 ENCOUNTER — Emergency Department (HOSPITAL_COMMUNITY): Payer: Medicare Other

## 2021-06-09 ENCOUNTER — Other Ambulatory Visit: Payer: Self-pay

## 2021-06-09 ENCOUNTER — Observation Stay (HOSPITAL_COMMUNITY)
Admission: EM | Admit: 2021-06-09 | Discharge: 2021-06-11 | Disposition: A | Discharge status: Skilled Nursing Facility | Payer: Medicare Other | Attending: Internal Medicine | Admitting: Internal Medicine

## 2021-06-09 ENCOUNTER — Encounter (HOSPITAL_COMMUNITY): Payer: Self-pay | Admitting: *Deleted

## 2021-06-09 DIAGNOSIS — Y92009 Unspecified place in unspecified non-institutional (private) residence as the place of occurrence of the external cause: Secondary | ICD-10-CM | POA: Insufficient documentation

## 2021-06-09 DIAGNOSIS — M25551 Pain in right hip: Secondary | ICD-10-CM | POA: Diagnosis not present

## 2021-06-09 DIAGNOSIS — I5030 Unspecified diastolic (congestive) heart failure: Secondary | ICD-10-CM | POA: Insufficient documentation

## 2021-06-09 DIAGNOSIS — I2510 Atherosclerotic heart disease of native coronary artery without angina pectoris: Secondary | ICD-10-CM | POA: Insufficient documentation

## 2021-06-09 DIAGNOSIS — I132 Hypertensive heart and chronic kidney disease with heart failure and with stage 5 chronic kidney disease, or end stage renal disease: Secondary | ICD-10-CM | POA: Insufficient documentation

## 2021-06-09 DIAGNOSIS — E1140 Type 2 diabetes mellitus with diabetic neuropathy, unspecified: Secondary | ICD-10-CM | POA: Insufficient documentation

## 2021-06-09 DIAGNOSIS — N186 End stage renal disease: Secondary | ICD-10-CM | POA: Insufficient documentation

## 2021-06-09 DIAGNOSIS — Z7985 Long-term (current) use of injectable non-insulin antidiabetic drugs: Secondary | ICD-10-CM | POA: Diagnosis not present

## 2021-06-09 DIAGNOSIS — Z794 Long term (current) use of insulin: Secondary | ICD-10-CM | POA: Diagnosis not present

## 2021-06-09 DIAGNOSIS — G40909 Epilepsy, unspecified, not intractable, without status epilepticus: Secondary | ICD-10-CM

## 2021-06-09 DIAGNOSIS — S79911A Unspecified injury of right hip, initial encounter: Principal | ICD-10-CM | POA: Insufficient documentation

## 2021-06-09 DIAGNOSIS — L89151 Pressure ulcer of sacral region, stage 1: Secondary | ICD-10-CM | POA: Diagnosis not present

## 2021-06-09 DIAGNOSIS — E1165 Type 2 diabetes mellitus with hyperglycemia: Secondary | ICD-10-CM | POA: Diagnosis not present

## 2021-06-09 DIAGNOSIS — Z992 Dependence on renal dialysis: Secondary | ICD-10-CM | POA: Diagnosis not present

## 2021-06-09 DIAGNOSIS — Z960 Presence of urogenital implants: Secondary | ICD-10-CM | POA: Diagnosis not present

## 2021-06-09 DIAGNOSIS — W19XXXA Unspecified fall, initial encounter: Secondary | ICD-10-CM | POA: Diagnosis present

## 2021-06-09 DIAGNOSIS — W1839XA Other fall on same level, initial encounter: Secondary | ICD-10-CM | POA: Insufficient documentation

## 2021-06-09 LAB — CBC WITH DIFFERENTIAL/PLATELET
Abs Immature Granulocytes: 0.06 10*3/uL (ref 0.00–0.07)
Basophils Absolute: 0 10*3/uL (ref 0.0–0.1)
Basophils Relative: 0 %
Eosinophils Absolute: 0.1 10*3/uL (ref 0.0–0.5)
Eosinophils Relative: 1 %
HCT: 31.1 % — ABNORMAL LOW (ref 36.0–46.0)
Hemoglobin: 9.5 g/dL — ABNORMAL LOW (ref 12.0–15.0)
Immature Granulocytes: 1 %
Lymphocytes Relative: 23 %
Lymphs Abs: 1.4 10*3/uL (ref 0.7–4.0)
MCH: 31 pg (ref 26.0–34.0)
MCHC: 30.5 g/dL (ref 30.0–36.0)
MCV: 101.6 fL — ABNORMAL HIGH (ref 80.0–100.0)
Monocytes Absolute: 0.6 10*3/uL (ref 0.1–1.0)
Monocytes Relative: 10 %
Neutro Abs: 3.8 10*3/uL (ref 1.7–7.7)
Neutrophils Relative %: 65 %
Platelets: 126 10*3/uL — ABNORMAL LOW (ref 150–400)
RBC: 3.06 MIL/uL — ABNORMAL LOW (ref 3.87–5.11)
RDW: 13.9 % (ref 11.5–15.5)
WBC: 5.9 10*3/uL (ref 4.0–10.5)
nRBC: 0 % (ref 0.0–0.2)

## 2021-06-09 LAB — BASIC METABOLIC PANEL
Anion gap: 10 (ref 5–15)
BUN: 34 mg/dL — ABNORMAL HIGH (ref 8–23)
CO2: 21 mmol/L — ABNORMAL LOW (ref 22–32)
Calcium: 8.4 mg/dL — ABNORMAL LOW (ref 8.9–10.3)
Chloride: 107 mmol/L (ref 98–111)
Creatinine, Ser: 5.97 mg/dL — ABNORMAL HIGH (ref 0.44–1.00)
GFR, Estimated: 7 mL/min — ABNORMAL LOW (ref >60)
Glucose, Bld: 158 mg/dL — ABNORMAL HIGH (ref 70–99)
Potassium: 4.1 mmol/L (ref 3.5–5.1)
Sodium: 138 mmol/L (ref 135–145)

## 2021-06-09 MED ORDER — HYDROCODONE-ACETAMINOPHEN 5-325 MG PO TABS
1.0000 | ORAL_TABLET | Freq: Once | ORAL | Status: AC
  Administered 2021-06-09: 1 via ORAL
  Filled 2021-06-09: qty 1

## 2021-06-09 NOTE — ED Triage Notes (Signed)
Pt states that she was getting up last night when she got dizzy and fell on the floor, c/o right hip pain, positive pedal pulse noted, able to move toes, foot,  ?

## 2021-06-09 NOTE — ED Notes (Signed)
Patient transported to CT 

## 2021-06-09 NOTE — ED Notes (Signed)
Patient speaking with son at this time.  ?

## 2021-06-09 NOTE — ED Provider Notes (Signed)
?[facility] ?Provider Note ? ? ?CSN: [id number] ?Arrival date & time: 06/09/21  1510 ? ?  ? ?History ? ?Chief Complaint  ?Patient presents with  ? Fall  ? ? ?Erica [name] is a 66 y.o. female. ? ?66 year old female with past medical history ESRD on dialysis, CAD presents today for evaluation of head pain following fall.  Patient states last night she got up to get some water when the walker slid out from underneath her and she fell on her right side onto her hip.  She states she has ambulated minimally since the incident and it causes her significant pain.  Denies other injuries during the fall.  Denies head trauma.  She is not on anticoagulation.  She denies chest pain, shortness of breath, palpitations surrounding the incident.  She denies loss of consciousness. ? ?The history is provided by the patient. No language interpreter was used.  ? ?  ? ?Home Medications ?Prior to Admission medications   ?Medication Sig Start Date End Date Taking? Authorizing Provider  ?acetaminophen (TYLENOL) 325 MG tablet Take 650 mg by mouth every 4 (four) hours as needed for moderate pain.    [provider]  ?albuterol (VENTOLIN HFA) 108 (90 Base) MCG/ACT inhaler Inhale 2 puffs into the lungs every 4 (four) hours as needed for wheezing or shortness of breath.     [provider]  ?alum & mag hydroxide-simeth (MAALOX/MYLANTA) 200-200-20 MG/5ML suspension Take 30 mLs by mouth with breakfast, with lunch, and with evening meal.    [provider]  ?bacitracin 500 UNIT/GM ointment Apply 1 application topically See admin instructions. Apply 1 application to raised areas on back of head topically twice a day for infection    [provider]  ?Biotin 10 MG TABS Take 10 mg by mouth daily.    [provider]  ?calcitRIOL (ROCALTROL) 0.25 MCG capsule Take 1 capsule (0.25 mcg total) by mouth Every Tuesday,Thursday,and Saturday with dialysis. 12/18/17   Love, Ivan Anchors, PA-C   ?carvedilol (COREG) 12.5 MG tablet Take 1 tablet (12.5 mg total) by mouth 2 (two) times daily. 09/24/20   Johnson, Clanford L, MD  ?cinacalcet (SENSIPAR) 30 MG tablet Take 30 mg by mouth every evening.    [provider]  ?ferric citrate (AURYXIA) 1 GM 210 MG(Fe) tablet Take 2 tablets (420 mg total) by mouth 3 (three) times daily with meals. 11/15/18   Kinnie Feil, MD  ?folic acid (FOLVITE) 1 MG tablet Take 1 mg by mouth every morning.    [provider]  ?HYDROcodone-acetaminophen (NORCO/VICODIN) 5-325 MG tablet Take 1 tablet by mouth every 4 (four) hours as needed. 01/12/21   Isla Pence, MD  ?hydrOXYzine (ATARAX/VISTARIL) 25 MG tablet Take 25 mg by mouth every 12 (twelve) hours as needed. 12/27/20   [provider]  ?insulin aspart (NOVOLOG) 100 UNIT/ML injection Inject 1-3 Units into the skin See admin instructions. Inject per sliding scale  ?If BS 151-250= 1 unit ?251- 350= 2 units ?351-400= 3 units ?> 400= 3 units ?Give subcutaneous 4 times a day    [provider]  ?insulin lispro (HUMALOG) 100 UNIT/ML injection Inject 4-7 Units into the skin in the morning, at noon, in the evening, and at bedtime. Per sliding scale    [provider]  ?LEVEMIR 100 UNIT/ML injection Inject 6 Units into the skin daily. 11/14/20   [provider]  ?levETIRAcetam (KEPPRA) 500 MG tablet Take 500 mg by mouth at bedtime.  [provider]  ?loperamide (IMODIUM) 2 MG capsule Take 4 mg by mouth daily as needed for diarrhea or loose stools.    [provider]  ?NOVOLOG 100 UNIT/ML injection Inject 2 Units into the skin See admin instructions. Inject 2 units subcutaneously before meals, Hold if BS is <150 mg/dl 12/30/20   [provider]  ?pantoprazole (PROTONIX) 40 MG tablet Take 1 tablet (40 mg total) by mouth daily. 11/19/19 01/12/21  Oswald Hillock, MD  ?promethazine (PHENERGAN) 25 MG tablet Take 25 mg by mouth every 8 (eight) hours as needed for  nausea or vomiting. 07/16/20   [provider]  ?traZODone (DESYREL) 150 MG tablet Take 150 mg by mouth at bedtime.    [provider]  ?TRULICITY 4.49 QP/5.9FM SOPN Inject 0.75 mg into the skin See admin instructions. Inject 0.75 mg once a week on Thursday 01/01/21   [provider]  ?   ? ?Allergies    ?Aspirin and Metformin and related   ? ?Review of Systems   ?Review of Systems  ?Constitutional:  Negative for chills and fever.  ?Cardiovascular:  Negative for palpitations and leg swelling.  ?Gastrointestinal:  Negative for nausea.  ?Neurological:  Positive for light-headedness. Negative for syncope.  ?All other systems reviewed and are negative. ? ?Physical Exam ?Updated Vital Signs ?BP (!) 181/73   Pulse 79   Temp 97.9 ?F (36.6 ?C) (Oral)   Resp 16   Ht 5' (1.524 m)   Wt 40.8 kg   SpO2 99%   BMI 17.58 kg/m?  ?Physical Exam ?Vitals and nursing note reviewed.  ?Constitutional:   ?   General: She is not in acute distress. ?   Appearance: Normal appearance. She is not ill-appearing.  ?HENT:  ?   Head: Normocephalic and atraumatic.  ?   Nose: Nose normal.  ?Eyes:  ?   General: No scleral icterus. ?   Extraocular Movements: Extraocular movements intact.  ?   Conjunctiva/sclera: Conjunctivae normal.  ?Cardiovascular:  ?   Rate and Rhythm: Normal rate and regular rhythm.  ?   Pulses: Normal pulses.  ?   Heart sounds: Normal heart sounds.  ?Pulmonary:  ?   Effort: Pulmonary effort is normal. No respiratory distress.  ?   Breath sounds: Normal breath sounds. No wheezing or rales.  ?Abdominal:  ?   General: There is no distension.  ?   Tenderness: There is no abdominal tenderness.  ?Musculoskeletal:     ?   General: Normal range of motion.  ?   Cervical back: Normal range of motion.  ?   Right lower leg: No edema.  ?   Left lower leg: No edema.  ?   Comments: Patient with limited range of motion in the right lower extremity secondary to pain.  Full passive range of motion in left lower  extremity.  Tenderness to palpation of the lateral and posterior right hip.  Palpation of the left hip produces pain in the right hip.  Cervical, thoracic, lumbar spine without tenderness to palpation.  2+ DP pulse present bilaterally.  ?Skin: ?   General: Skin is warm and dry.  ?Neurological:  ?   General: No focal deficit present.  ?   Mental Status: She is alert. Mental status is at baseline.  ? ? ?ED Results / Procedures / Treatments   ?Labs ?(all labs ordered are listed, but only abnormal results are displayed) ?Labs Reviewed  ?CBC WITH DIFFERENTIAL/PLATELET  ?BASIC METABOLIC PANEL  ? ? ?  EKG ?None ? ?Radiology ?No results found. ? ?Procedures ?Procedures  ? ? ?Medications Ordered in ED ?Medications - No data to display ? ?ED Course/ Medical Decision Making/ A&P ?Clinical Course as of 06/09/21 1941  ?Mon Jun 09, 2021  ?1822 X-ray negative for acute fracture.  Given pain with range of motion and tender to palpation over the right hip will obtain CT imaging to evaluate for occult fracture.  Will provide pain medication. [AA]  ?  ?Clinical Course User Index ?Maudie Mercury, PA-C  ? ?                        ?Medical Decision Making ?Amount and/or Complexity of Data Reviewed ?Labs: ordered. ?Radiology: ordered. ? ?Risk ?Prescription drug management. ? ? ?Medical Decision Making / ED Course ? ? ?This patient presents to the ED for concern of fall, hip pain, this involves an extensive number of treatment options, and is a complaint that carries with it a high risk of complications and morbidity.  The differential diagnosis includes acute hip fracture, muscle strain, hip dislocation ? ?MDM: ?66 year old female presents today for evaluation of right hip pain following a fall that occurred last night.  She has minimally ambulated since the time of the incident.  Point tenderness present over right lateral and posterior hip.  Will evaluate with x-ray, basic blood work. ?X-rays of bilateral hips negative for acute  fracture.  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  Passive range of motion results in significant pain of the right hip. ?CT of the hip without occult fracture.  Discussed with nurse to ambulate patient with walker.

## 2021-06-09 NOTE — ED Provider Notes (Signed)
?  Patient signed out to me by Evlyn Courier, PA-C pending completion of work-up. ? ?Patient was initially evaluated for hip pain secondary to fall.  Patient was noted to have pain that worsens with attempted ambulation and she is not wanting to bear weight to the right leg.  Uses walker at baseline ? ?CT of the hip without evidence of fracture or dislocation.  We attempted to ambulate patient in the department.  She was able to make very small steps using a walker.  She was unable to apply full weight to the right leg due to pain. ? ?Attempted to dispo patient home, several calls made to patient's son who is her caregiver.  No answer on multiple attempts.  Patient is scheduled to have her dialysis tomorrow.  Serum creatinine this evening is near 6. ? ?Discussed findings with [facility] hospitalist, Dr. Myna Hidalgo who is agreeable to admit patient for observation ?  ?Kem Parkinson, PA-C ?06/10/21 0001 ? ?  ?Godfrey Pick, MD ?06/11/21 [phone] ? ?

## 2021-06-09 NOTE — ED Notes (Signed)
Attempted to call pts son multiple times to update him on his mother and her plan for discharge. No answer. Will continue to try. ?

## 2021-06-09 NOTE — ED Notes (Signed)
Pt ambulated in hallway with walker unassisted. Pt walking very slowly and has a lot of soreness. Pt steps stronger with her right foot and then her left leg is weaker following behind the right. PA made aware and to assess and talk with patient. ?

## 2021-06-10 ENCOUNTER — Encounter (HOSPITAL_COMMUNITY): Payer: Self-pay | Admitting: Family Medicine

## 2021-06-10 DIAGNOSIS — M25551 Pain in right hip: Secondary | ICD-10-CM | POA: Insufficient documentation

## 2021-06-10 DIAGNOSIS — S79911A Unspecified injury of right hip, initial encounter: Secondary | ICD-10-CM | POA: Diagnosis not present

## 2021-06-10 DIAGNOSIS — W19XXXA Unspecified fall, initial encounter: Secondary | ICD-10-CM | POA: Diagnosis not present

## 2021-06-10 DIAGNOSIS — G40909 Epilepsy, unspecified, not intractable, without status epilepticus: Secondary | ICD-10-CM | POA: Diagnosis not present

## 2021-06-10 DIAGNOSIS — N186 End stage renal disease: Secondary | ICD-10-CM | POA: Diagnosis not present

## 2021-06-10 DIAGNOSIS — E1165 Type 2 diabetes mellitus with hyperglycemia: Secondary | ICD-10-CM | POA: Diagnosis not present

## 2021-06-10 LAB — GLUCOSE, CAPILLARY
Glucose-Capillary: 122 mg/dL — ABNORMAL HIGH (ref 70–99)
Glucose-Capillary: 249 mg/dL — ABNORMAL HIGH (ref 70–99)
Glucose-Capillary: 88 mg/dL (ref 70–99)
Glucose-Capillary: 113 mg/dL — ABNORMAL HIGH (ref 70–99)

## 2021-06-10 MED ORDER — CARVEDILOL 12.5 MG PO TABS
12.5000 mg | ORAL_TABLET | Freq: Two times a day (BID) | ORAL | Status: DC
  Administered 2021-06-10 – 2021-06-11 (×3): 12.5 mg via ORAL
  Filled 2021-06-10 (×3): qty 1

## 2021-06-10 MED ORDER — AMLODIPINE BESYLATE 5 MG PO TABS
5.0000 mg | ORAL_TABLET | Freq: Every day | ORAL | Status: DC
  Administered 2021-06-10 – 2021-06-11 (×2): 5 mg via ORAL
  Filled 2021-06-10 (×2): qty 1

## 2021-06-10 MED ORDER — ACETAMINOPHEN 650 MG RE SUPP: 650.0000 mg | Freq: Four times a day (QID) | RECTAL | Status: DC | PRN

## 2021-06-10 MED ORDER — OXYCODONE HCL 5 MG PO TABS
5.0000 mg | ORAL_TABLET | ORAL | Status: DC | PRN
  Administered 2021-06-10 – 2021-06-11 (×4): 5 mg via ORAL
  Filled 2021-06-10 (×4): qty 1

## 2021-06-10 MED ORDER — HYDRALAZINE HCL 20 MG/ML IJ SOLN
5.0000 mg | Freq: Four times a day (QID) | INTRAMUSCULAR | Status: DC | PRN
  Filled 2021-06-10: qty 1

## 2021-06-10 MED ORDER — ACETAMINOPHEN 325 MG PO TABS: 650.0000 mg | ORAL_TABLET | Freq: Four times a day (QID) | ORAL | Status: DC | PRN

## 2021-06-10 MED ORDER — HEPARIN SODIUM (PORCINE) 5000 UNIT/ML IJ SOLN
5000.0000 [IU] | Freq: Three times a day (TID) | INTRAMUSCULAR | Status: DC
  Administered 2021-06-10 – 2021-06-11 (×5): 5000 [IU] via SUBCUTANEOUS
  Filled 2021-06-10 (×5): qty 1

## 2021-06-10 MED ORDER — INSULIN ASPART 100 UNIT/ML IJ SOLN
0.0000 [IU] | Freq: Three times a day (TID) | INTRAMUSCULAR | Status: DC
  Administered 2021-06-10: 2 [IU] via SUBCUTANEOUS

## 2021-06-10 MED ORDER — LEVETIRACETAM 500 MG PO TABS
500.0000 mg | ORAL_TABLET | Freq: Every day | ORAL | Status: DC
  Administered 2021-06-10 (×2): 500 mg via ORAL
  Filled 2021-06-10 (×2): qty 1

## 2021-06-10 NOTE — H&P (Signed)
?History and Physical  ? ? ?KARLENA LUEBKE XQJ:[id number] DOB: 01-30-1956 DOA: 06/09/2021 ? ?PCP: System, Provider Not In  ? ?Patient coming from: Home  ? ?Chief Complaint: Fall, right hip pain  ? ?HPI: Erica [name] is a pleasant 66 y.o. female with medical history significant for type 2 diabetes mellitus, CAD, alcoholism in remission, and ESRD on hemodialysis, now presenting to the emergency department with right hip pain after fall.  The patient reports that she was in her usual state of health when she was going to the sink in her home for some water and her walker slid out from under her and she fell onto her right hip.  She has been experiencing pain at the right hip, worse when trying to bear weight.  She denies losing consciousness and denies any other injury.  She denies any new focal numbness or weakness.  She lives with her son and normally ambulates around her home with a walker.  She reports completing dialysis on 06/07/2021. ? ?ED Course: Upon arrival to the ED, patient is found to be afebrile and saturating well on room air with normal heart rate and elevated blood pressures.  Plain films of the bilateral hips and pelvis were negative for acute fracture or dislocation and CT, while technically difficult, was negative for fracture or dislocation as well.  Patient was provided analgesics in the ED and was able to bear some weight, but was having a lot of difficulty ambulating due to the pain and hospitalists were consulted for admission. ? ?Review of Systems:  ?All other systems reviewed and apart from HPI, are negative. ? ?Past Medical History:  ?Diagnosis Date  ? Alcohol-induced pancreatitis   ? CAD (coronary artery disease) 09/17/2020  ? Chronic diarrhea   ? Depression   ? Diabetes mellitus   ? fasting blood sugar 110-120s  ? Dialysis patient [place])   ? Diastolic CHF ([place])   ? DKA (diabetic ketoacidoses)   ? Gastroparesis   ? GERD (gastroesophageal reflux disease)   ? Heart murmur   ? History of  kidney stones   ? Hyperlipidemia   ? Hypertension   ? Hypokalemia   ? Muscle spasm   ? Neuropathic pain   ? Neuropathy   ? Hx: of  ? Pyelonephritis   ? Seizures ([place])   ? Vitamin B12 deficiency   ? Vitamin D deficiency   ? ? ?Past Surgical History:  ?Procedure Laterality Date  ? AV FISTULA PLACEMENT Left 12/15/2017  ? Procedure: INSERTION OF ARTERIOVENOUS (AV) GORE-TEX GRAFT ARM;  Surgeon: Rosetta Posner, MD;  Location: [facility];  Service: Vascular;  Laterality: Left;  ? CATARACT EXTRACTION W/PHACO Right 03/14/2015  ? Procedure: CATARACT EXTRACTION PHACO AND INTRAOCULAR LENS PLACEMENT (IOC);  Surgeon: Baruch Goldmann, MD;  Location: [facility];  Service: Ophthalmology;  Laterality: Right;  CDE:11.13  ? CATARACT EXTRACTION W/PHACO Left 04/11/2015  ? Procedure: CATARACT EXTRACTION PHACO AND INTRAOCULAR LENS PLACEMENT LEFT EYE CDE=9.68;  Surgeon: Baruch Goldmann, MD;  Location: [facility];  Service: Ophthalmology;  Laterality: Left;  ? COLONOSCOPY  02/24/2010  ? cystoscopy with ureteral stent  Bilateral 10/07/2017  ? At [facility] CV LINE RIGHT  11/17/2017  ? IR FLUORO GUIDE CV LINE RIGHT  11/22/2017  ? IR REMOVAL TUN CV CATH W/O FL  01/26/2018  ? IR US GUIDE VASC ACCESS RIGHT  11/17/2017  ? MULTIPLE EXTRACTIONS WITH ALVEOLOPLASTY N/A 06/13/2012  ? Procedure: MULTIPLE EXTRACION WITH ALVEOLOPLASTY EXTRACT:  18, 19, 20, 21, 22, 24, 25, 27, 28, 29, 30, 31;  Surgeon: Gae Bon, DDS;  Location: [facility];  Service: Oral Surgery;  Laterality: N/A;  ? ORIF HUMERUS FRACTURE Right 04/20/2019  ? Procedure: OPEN REDUCTION INTERNAL FIXATION (ORIF) PROXIMAL HUMERUS FRACTURE;  Surgeon: Marchia Bond, MD;  Location: [facility];  Service: Orthopedics;  Laterality: Right;  ? TUBAL LIGATION    ? URETERAL STENT PLACEMENT  09/2017  ? ? ?Social History:  ? reports that she has never smoked. Her smokeless tobacco use includes snuff. She reports that she does not currently use alcohol after a past usage of about 1.0 standard drink per week.  She reports that she does not use drugs. ? ?Allergies  ?Allergen Reactions  ? Aspirin Palpitations and Other (See Comments)  ?  Listed on [facility] 11/12/18  ? Metformin And Related   ? ? ?Family History  ?Problem Relation Age of Onset  ? Diabetes Sister   ? Chronic Renal Failure Neg Hx   ? ? ? ?Prior to Admission medications   ?Medication Sig Start Date End Date Taking? Authorizing Provider  ?acetaminophen (TYLENOL) 325 MG tablet Take 650 mg by mouth every 4 (four) hours as needed for moderate pain.    [provider]  ?albuterol (VENTOLIN HFA) 108 (90 Base) MCG/ACT inhaler Inhale 2 puffs into the lungs every 4 (four) hours as needed for wheezing or shortness of breath.     [provider]  ?alum & mag hydroxide-simeth (MAALOX/MYLANTA) 200-200-20 MG/5ML suspension Take 30 mLs by mouth with breakfast, with lunch, and with evening meal.    [provider]  ?bacitracin 500 UNIT/GM ointment Apply 1 application topically See admin instructions. Apply 1 application to raised areas on back of head topically twice a day for infection    [provider]  ?Biotin 10 MG TABS Take 10 mg by mouth daily.    [provider]  ?calcitRIOL (ROCALTROL) 0.25 MCG capsule Take 1 capsule (0.25 mcg total) by mouth Every Tuesday,Thursday,and Saturday with dialysis. 12/18/17   Love, Ivan Anchors, PA-C  ?carvedilol (COREG) 12.5 MG tablet Take 1 tablet (12.5 mg total) by mouth 2 (two) times daily. 09/24/20   Johnson, Clanford L, MD  ?cinacalcet (SENSIPAR) 30 MG tablet Take 30 mg by mouth every evening.    [provider]  ?ferric citrate (AURYXIA) 1 GM 210 MG(Fe) tablet Take 2 tablets (420 mg total) by mouth 3 (three) times daily with meals. 11/15/18   Kinnie Feil, MD  ?folic acid (FOLVITE) 1 MG tablet Take 1 mg by mouth every morning.    [provider]  ?HYDROcodone-acetaminophen (NORCO/VICODIN) 5-325 MG tablet Take 1 tablet by mouth every 4 (four) hours as needed. 01/12/21   Isla Pence, MD  ?hydrOXYzine (ATARAX/VISTARIL) 25 MG tablet Take 25 mg by mouth every 12 (twelve) hours as needed. 12/27/20   [provider]  ?insulin aspart (NOVOLOG) 100 UNIT/ML injection Inject 1-3 Units into the skin See admin instructions. Inject per sliding scale  ?If BS 151-250= 1 unit ?251- 350= 2 units ?351-400= 3 units ?> 400= 3 units ?Give subcutaneous 4 times a day    [provider]  ?insulin lispro (HUMALOG) 100 UNIT/ML injection Inject 4-7 Units into the skin in the morning, at noon, in the evening, and at bedtime. Per sliding scale    [provider]  ?LEVEMIR 100 UNIT/ML injection Inject 6 Units into the skin daily. 11/14/20   [provider]  ?levETIRAcetam (  KEPPRA) 500 MG tablet Take 500 mg by mouth at bedtime.    [provider]  ?loperamide (IMODIUM) 2 MG capsule Take 4 mg by mouth daily as needed for diarrhea or loose stools.    [provider]  ?NOVOLOG 100 UNIT/ML injection Inject 2 Units into the skin See admin instructions. Inject 2 units subcutaneously before meals, Hold if BS is <150 mg/dl 12/30/20   [provider]  ?pantoprazole (PROTONIX) 40 MG tablet Take 1 tablet (40 mg total) by mouth daily. 11/19/19 01/12/21  Oswald Hillock, MD  ?promethazine (PHENERGAN) 25 MG tablet Take 25 mg by mouth every 8 (eight) hours as needed for nausea or vomiting. 07/16/20   [provider]  ?traZODone (DESYREL) 150 MG tablet Take 150 mg by mouth at bedtime.    [provider]  ?TRULICITY 0.25 [place]/7.0WC SOPN Inject 0.75 mg into the skin See admin instructions. Inject 0.75 mg once a week on Thursday 01/01/21   [provider]  ? ? ?Physical Exam: ?Vitals:  ? 06/09/21 1532 06/09/21 1534 06/09/21 1817  ?BP: (!) 181/73  (!) 195/74  ?Pulse: 79  85  ?Resp: 16  18  ?Temp: 97.9 ?F (36.6 ?C)    ?TempSrc: Oral    ?SpO2: 99%  100%  ?Weight:  40.8 kg   ?Height:  5' (1.524 m)   ? ? ?Constitutional: NAD, calm  ?Eyes: PERTLA, lids and  conjunctivae normal ?ENMT: Mucous membranes are moist. Posterior pharynx clear of any exudate or lesions.   ?Neck: supple, no masses  ?Respiratory: no wheezing, no crackles. No accessory muscle use.  ?Cardiov

## 2021-06-10 NOTE — NC FL2 (Signed)
?[place] MEDICAID FL2 LEVEL OF CARE SCREENING TOOL  ?  ? ?IDENTIFICATION  ?Patient Name: ?Erica [name] Birthdate: 25-Apr-1955 Sex: female Admission Date (Current Location): ?06/09/2021  ?[place] and [place] Number: [place] ?[id number] T Facility and Address:  ?[facility] [address], [place] ?     Provider Number: ?[id number]  ?Attending Physician Name and Address:  ?Little Ishikawa, MD ? Relative Name and Phone Number:  [name] (Son)   [phone] ?   ?Current Level of Care: ?Hospital (observation) Recommended Level of Care: ?[place] Prior Approval Number: ?  ? ?Date Approved/Denied: ?  PASRR Number: ?[id number] H ? ?Discharge Plan: ?Home ?  ? ?Current Diagnoses: ?Patient Active Problem List  ? Diagnosis Date Noted  ? Right hip pain   ? CAD (coronary artery disease) 09/17/2020  ? Altered mental status   ? Acute metabolic encephalopathy 15/17/6160  ? Diarrhea   ? Pressure injury of skin 06/09/2020  ? Sacral fracture ([place]) 06/08/2020  ? Thrombocytopenia ([place]) 06/08/2020  ? Hypertensive urgency 06/08/2020  ? (HFpEF) heart failure with preserved ejection fraction ([place]) 06/08/2020  ? Fall 06/08/2020  ? Elevated MCV 06/08/2020  ? Palliative care by specialist   ? Generalized weakness 11/14/2019  ? HCAP (healthcare-associated pneumonia) 11/14/2019  ? Hyperkalemia 11/13/2019  ? Hyponatremia 09/27/2019  ? Closed fracture of right proximal humerus 04/20/2019  ? Surgery, elective   ? Seizure ([place]) 11/12/2018  ? History of hydronephrosis --stents in place 12/08/2017  ? Stroke ([place]) 11/29/2017  ? Seizures ([place])   ? Hydronephrosis   ? Recurrent UTI   ? Diabetes mellitus type 2 in nonobese [facility])   ? ESRD (end stage renal disease) ([place])   ? Anemia of chronic disease   ? Chronic diastolic congestive heart failure ([place])   ? Essential hypertension   ? PICC (peripherally inserted central catheter) in place   ? Acute pyelonephritis 11/12/2017  ? Uncontrolled type 2  diabetes mellitus with hyperglycemia, with long-term current use of insulin ([place]) 11/10/2017  ? Renal failure 11/08/2017  ? Seizure disorder ([place]) 11/08/2017  ? Orthostatic hypotension 07/25/2014  ? Moderate protein malnutrition ([place]) 09/15/2013  ? Aspiration pneumonia ([place]) 09/15/2013  ? Acute respiratory failure requiring reintubation ([place]) 09/11/2013  ? probable Seizures due to metabolic disorder 73/71/0626  ? Lactic acidosis 03/19/2013  ? Abdominal pain 03/19/2013  ? Rotavirus infection 10/29/2012  ? Diabetes mellitus with end-stage renal disease ([place]) 10/29/2012  ? Protein-calorie malnutrition, severe ([place]) 10/27/2012  ? NSTEMI (non-ST elevated myocardial infarction) ([place]) 10/26/2012  ? Fever, unspecified 10/26/2012  ? Hypotension 10/25/2012  ? Metabolic acidosis 94/85/4627  ? Chronic diarrhea 10/25/2012  ? Tobacco abuse 10/25/2012  ? DKA (diabetic ketoacidoses) 09/09/2012  ? Dehydration 09/09/2012  ? DKA, type 2 ([place]) 05/20/2012  ? Abnormal LFTs 05/20/2012  ? Heart murmur, systolic 03/50/0938  ? Hypoglycemia 07/08/2011  ? Metabolic encephalopathy 18/29/9371  ? Alcohol abuse 07/08/2011  ? Hypokalemia 07/08/2011  ? Nausea & vomiting 07/08/2011  ? H/O chronic pancreatitis 07/08/2011  ? ? ?Orientation RESPIRATION BLADDER Height & Weight   ?  ?Self, Time, Situation, Place ? Normal Continent Weight: 98 lb 1.7 oz (44.5 kg) ?Height:  5' (152.4 cm)  ?BEHAVIORAL SYMPTOMS/MOOD NEUROLOGICAL BOWEL NUTRITION STATUS  ?    Continent Diet  ?AMBULATORY STATUS COMMUNICATION OF NEEDS Skin   ?Limited Assist Verbally Normal ?  ?  ?  ?    ?     ?     ? ? ?Personal Care  Assistance Level of Assistance  ?Bathing, Dressing, Feeding Bathing Assistance: Limited assistance ?Feeding assistance: Independent ?Dressing Assistance: Limited assistance ?   ? ?Functional Limitations Info  ?Sight, Hearing, Speech Sight Info: Adequate ?Hearing Info: Adequate ?Speech Info: Adequate  ? ? ?SPECIAL CARE FACTORS FREQUENCY  ?PT (By licensed PT), OT (By  licensed OT)   ?  ?PT Frequency: 5x/week ?OT Frequency: 3x/week ?  ?  ?  ?   ? ? ?Contractures Contractures Info: Not present  ? ? ?Additional Factors Info  ?Code Status, Allergies, Psychotropic Code Status Info: Full Code ?Allergies Info: Aspirin, Metformin and related ?Psychotropic Info: Trazadone ?  ?  ?   ? ?Current Medications (06/10/2021):  This is the current hospital active medication list ?Current Facility-Administered Medications  ?Medication Dose Route Frequency Provider Last Rate Last Admin  ? acetaminophen (TYLENOL) tablet 650 mg  650 mg Oral Q6H PRN Opyd, Ilene Qua, MD      ? Or  ? acetaminophen (TYLENOL) suppository 650 mg  650 mg Rectal Q6H PRN Opyd, Ilene Qua, MD      ? amLODipine (NORVASC) tablet 5 mg  5 mg Oral Daily Little Ishikawa, MD   5 mg at 06/10/21 1213  ? carvedilol (COREG) tablet 12.5 mg  12.5 mg Oral BID Little Ishikawa, MD   12.5 mg at 06/10/21 1213  ? heparin injection 5,000 Units  5,000 Units Subcutaneous Q8H Vianne Bulls, MD   5,000 Units at 06/10/21 0531  ? insulin aspart (novoLOG) injection 0-6 Units  0-6 Units Subcutaneous TID WC Opyd, Ilene Qua, MD   2 Units at 06/10/21 1213  ? levETIRAcetam (KEPPRA) tablet 500 mg  500 mg Oral QHS Opyd, Ilene Qua, MD   500 mg at 06/10/21 0308  ? oxyCODONE (Oxy IR/ROXICODONE) immediate release tablet 5 mg  5 mg Oral Q4H PRN Opyd, Ilene Qua, MD   5 mg at 06/10/21 0308  ? ? ? ?Discharge Medications: ?Please see discharge summary for a list of discharge medications. ? ?Relevant Imaging Results: ? ?Relevant Lab Results: ? ? ?Additional Information ?SSN: [id number] ? ?Devetta Hagenow D, LCSW ? ? ? ? ?

## 2021-06-10 NOTE — Evaluation (Signed)
Physical Therapy Evaluation ?Patient Details ?Name: Erica [name] ?MRN: [id number] ?DOB: 1955/02/20 ?Today's Date: 06/10/2021 ? ?History of Present Illness ? Upon arrival to the ED, patient is found to be afebrile and saturating well on room air with normal heart rate and elevated blood pressures.  Plain films of the bilateral hips and pelvis were negative for acute fracture or dislocation and CT, while technically difficult, was negative for fracture or dislocation as well.  Patient was provided analgesics in the ED and was able to bear some weight, but was having a lot of difficulty ambulating due to the pain and hospitalists were consulted for admission. ?  ?Clinical Impression ? Patient limited to a few steps at bedside mostly due to generalized weakness and increasing right hip with movement, unsteady on feet and unable to attempt walking away from bedside.  Patient tolerated sitting up in chair after therapy.   Patient will benefit from continued skilled physical therapy in hospital and recommended venue below to increase strength, balance, endurance for safe ADLs and gait.  ?   ? ?Recommendations for follow up therapy are one component of a multi-disciplinary discharge planning process, led by the attending physician.  Recommendations may be updated based on patient status, additional functional criteria and insurance authorization. ? ?Follow Up Recommendations Skilled nursing-short term rehab (<3 hours/day) ? ?  ?Assistance Recommended at Discharge Set up Supervision/Assistance  ?Patient can return home with the following ? A little help with bathing/dressing/bathroom;A lot of help with walking and/or transfers;Help with stairs or ramp for entrance;Assistance with cooking/housework ? ?  ?Equipment Recommendations None recommended by PT  ?Recommendations for Other Services ?    ?  ?Functional Status Assessment Patient has had a recent decline in their functional status and demonstrates the ability to make  significant improvements in function in a reasonable and predictable amount of time.  ? ?  ?Precautions / Restrictions Precautions ?Precautions: Fall ?Restrictions ?Weight Bearing Restrictions: No  ? ?  ? ?Mobility ? Bed Mobility ?Overal bed mobility: Needs Assistance ?Bed Mobility: Supine to Sit ?  ?  ?Supine to sit: Min assist ?  ?  ?General bed mobility comments: as per OT notes ?  ? ?Transfers ?Overall transfer level: Needs assistance ?Equipment used: Rolling walker (2 wheels) ?Transfers: Sit to/from Stand, Bed to chair/wheelchair/BSC ?Sit to Stand: Min guard, Min assist ?  ?Step pivot transfers: Min guard, Min assist ?  ?  ?  ?General transfer comment: as per OT notes ?  ? ?Ambulation/Gait ?Ambulation/Gait assistance: Mod assist ?Gait Distance (Feet): 6 Feet ?Assistive device: Rolling walker (2 wheels) ?Gait Pattern/deviations: Decreased step length - right, Decreased step length - left, Decreased stride length ?Gait velocity: decreased ?  ?  ?General Gait Details: limited to a few slow labored steps at bedside before having to sit due to right hip pain and generalized weakness ? ?Stairs ?  ?  ?  ?  ?  ? ?Wheelchair Mobility ?  ? ?Modified Rankin (Stroke Patients Only) ?  ? ?  ? ?Balance Overall balance assessment: Needs assistance ?Sitting-balance support: Feet supported, No upper extremity supported ?Sitting balance-Leahy Scale: Fair ?Sitting balance - Comments: fair to good seated at EOB ?  ?Standing balance support: Bilateral upper extremity supported, During functional activity, Reliant on assistive device for balance ?Standing balance-Leahy Scale: Fair ?Standing balance comment: poor to fair with RW ?  ?  ?  ?  ?  ?  ?  ?  ?  ?  ?  ?   ? ? ? ?  Pertinent Vitals/Pain Pain Assessment ?Pain Assessment: 0-10 ?Pain Score: 9  ?Pain Location: R hip ?Pain Descriptors / Indicators: Sharp ?Pain Intervention(s): Limited activity within patient's tolerance, Monitored during session, Repositioned  ? ? ?Home Living  Family/patient expects to be discharged to:: Private residence ?Living Arrangements: Children ?Available Help at Discharge: Family;Available PRN/intermittently ?Type of Home: Apartment ?Home Access: Level entry ?  ?  ?  ?Home Layout: One level ?Home Equipment: Rolling Walker (2 wheels);BSC/3in1;Wheelchair - manual ?   ?  ?Prior Function Prior Level of Function : Needs assist ?  ?  ?  ?Physical Assist : Mobility (physical);ADLs (physical) ?Mobility (physical): Transfers;Gait;Stairs;Bed mobility ?  ?Mobility Comments: [place] ambulator with RW. ?ADLs Comments: Pt reports assist for all ADL's but grooming and eating. Pt assisted by family for IADL's as well. ?  ? ? ?Hand Dominance  ? Dominant Hand: Right ? ?  ?Extremity/Trunk Assessment  ? Upper Extremity Assessment ?Upper Extremity Assessment: Defer to OT evaluation ?  ? ?Lower Extremity Assessment ?Lower Extremity Assessment: Generalized weakness;RLE deficits/detail ?RLE Deficits / Details: grossly 3+/5 ?RLE: Unable to fully assess due to pain ?RLE Sensation: WNL ?RLE Coordination: WNL ?  ? ?Cervical / Trunk Assessment ?Cervical / Trunk Assessment: Normal  ?Communication  ? Communication: No difficulties  ?Cognition Arousal/Alertness: Awake/alert ?Behavior During Therapy: [facility] for tasks assessed/performed ?Overall Cognitive Status: Within Functional Limits for tasks assessed ?  ?  ?  ?  ?  ?  ?  ?  ?  ?  ?  ?  ?  ?  ?  ?  ?  ?  ?  ? ?  ?General Comments   ? ?  ?Exercises    ? ?Assessment/Plan  ?  ?PT Assessment Patient needs continued PT services  ?PT Problem List Decreased strength;Decreased activity tolerance;Decreased balance;Decreased mobility ? ?   ?  ?PT Treatment Interventions DME instruction;Gait training;Stair training;Functional mobility training;Therapeutic activities;Therapeutic exercise;Patient/family education;Balance training   ? ?PT Goals (Current goals can be found in the Care Plan section)  ?Acute Rehab PT Goals ?Patient Stated Goal: return home  after rehab ?PT Goal Formulation: With patient ?Time For Goal Achievement: 06/24/21 ?Potential to Achieve Goals: Good ? ?  ?Frequency Min 3X/week ?  ? ? ?Co-evaluation PT/OT/SLP Co-Evaluation/Treatment: Yes ?Reason for Co-Treatment: To address functional/ADL transfers ?PT goals addressed during session: Mobility/safety with mobility;Balance;Proper use of DME ?  ?  ? ? ?  ?AM-PAC PT "6 Clicks" Mobility  ?Outcome Measure   ?  ?  ?  ?  ?  ?  ? ?  ?End of Session   ?Activity Tolerance: Patient tolerated treatment well;Patient limited by fatigue ?Patient left: in chair;with call bell/phone within reach ?Nurse Communication: Mobility status ?PT Visit Diagnosis: Unsteadiness on feet (R26.81);Other abnormalities of gait and mobility (R26.89);Muscle weakness (generalized) (M62.81) ?  ? ?Time: [id number] ?PT Time Calculation (min) (ACUTE ONLY): 17 min ? ? ?Charges:   PT Evaluation ?$PT Eval Low Complexity: 1 Low ?PT Treatments ?$Therapeutic Activity: 8-22 mins ?  ?   ? ? ?1:57 PM, 06/10/21 ?Lonell Grandchild, MPT ?Physical Therapist with [place] ?[facility] ?[phone] office ?[phone] mobile phone ? ? ?

## 2021-06-10 NOTE — Evaluation (Signed)
Occupational Therapy Evaluation ?Patient Details ?Name: Erica [name] ?MRN: [id number] ?DOB: July 26, 1955 ?Today's Date: 06/10/2021 ? ? ?History of Present Illness Upon arrival to the ED, patient is found to be afebrile and saturating well on room air with normal heart rate and elevated blood pressures.  Plain films of the bilateral hips and pelvis were negative for acute fracture or dislocation and CT, while technically difficult, was negative for fracture or dislocation as well.  Patient was provided analgesics in the ED and was able to bear some weight, but was having a lot of difficulty ambulating due to the pain and hospitalists were consulted for admission. (Per MD note)  ? ?Clinical Impression ?  ?Pt agreeable to OT and PT co-evaluation. Pt reporting needing for assist for ADL's at baseline. Pt reports that son is not present 24/7 and that she is home alone at times. Pt requires min A for bed mobility at this time with min G to min A for standing and stand pivot transfers. Pt reported being in too much pain to attempt anything more that bed to chair transfer today. Pt demonstrates weak B UE with MMT testing. Pt a fall risk if returning home without assist for transfers at this time. Pt will benefit from continued OT in the hospital and recommended venue below to increase strength, balance, and endurance for safe ADL's.  ? ?  ?   ? ?Recommendations for follow up therapy are one component of a multi-disciplinary discharge planning process, led by the attending physician.  Recommendations may be updated based on patient status, additional functional criteria and insurance authorization.  ? ?Follow Up Recommendations ? Skilled nursing-short term rehab (<3 hours/day)  ?  ?Assistance Recommended at Discharge Intermittent Supervision/Assistance  ?Patient can return home with the following A little help with walking and/or transfers;A lot of help with bathing/dressing/bathroom;Assistance with cooking/housework;Assist  for transportation;Help with stairs or ramp for entrance ? ?  ?Functional Status Assessment ? Patient has had a recent decline in their functional status and demonstrates the ability to make significant improvements in function in a reasonable and predictable amount of time.  ?Equipment Recommendations ? None recommended by OT  ?  ?Recommendations for Other Services Other (comment) (Optometrist evaluation due to pt reports of increased L eye blurry vision since November.) ? ? ?  ?Precautions / Restrictions Precautions ?Precautions: Fall ?Restrictions ?Weight Bearing Restrictions: No (Simultaneous filing. User may not have seen previous data.)  ? ?  ? ?Mobility Bed Mobility ?Overal bed mobility: Needs Assistance ?Bed Mobility: Supine to Sit ?  ?  ?Supine to sit: Min assist ?  ?  ?General bed mobility comments: Slow labored movement to sit at EOB. ?  ? ?Transfers ?Overall transfer level: Needs assistance ?Equipment used: Rolling walker (2 wheels) ?Transfers: Sit to/from Stand, Bed to chair/wheelchair/BSC ?Sit to Stand: Min guard, Min assist ?  ?  ?Step pivot transfers: Min guard, Min assist ?  ?  ?General transfer comment: Slow labored movement with use of RW. ?  ? ?  ?Balance Overall balance assessment: Needs assistance ?Sitting-balance support: No upper extremity supported, Feet supported ?Sitting balance-Leahy Scale: Fair ?Sitting balance - Comments: fair to good seated at EOB ?  ?Standing balance support: Bilateral upper extremity supported, During functional activity, Reliant on assistive device for balance ?Standing balance-Leahy Scale: Fair ?Standing balance comment: poor to fair with RW ?  ?  ?  ?  ?  ?  ?  ?  ?  ?  ?  ?   ? ?  ADL either performed or assessed with clinical judgement  ? ?ADL Overall ADL's : Needs assistance/impaired ?  ?  ?Grooming: Standing;Min guard;Minimal assistance ?  ?  ?  ?Lower Body Bathing: Maximal assistance;Sitting/lateral leans ?  ?Upper Body Dressing : Sitting;Set up ?  ?Lower Body  Dressing: Maximal assistance;Sitting/lateral leans ?  ?Toilet Transfer: Min guard;Minimal assistance;Rolling walker (2 wheels);Stand-pivot ?Toilet Transfer Details (indicate cue type and reason): Simulated via EOB to chair transfer. ?Toileting- Water quality scientist and Hygiene: Min guard;Minimal assistance;Sit to/from stand ?  ?  ?  ?Functional mobility during ADLs: Min guard;Minimal assistance;Rolling walker (2 wheels) ?   ? ? ? ?Vision Baseline Vision/History: 1 Wears glasses ?Ability to See in Adequate Light: 1 Impaired ?Patient Visual Report: Blurring of vision (Pt reports that blurry vision in L eye has gotten worse ever since November of last year.) ?Vision Assessment?: Yes ?Tracking/Visual Pursuits: Able to track stimulus in all quads without difficulty ?Additional Comments: Pt unable to read numbers on board ~ 8 feet away.  ?   ?   ?  ?   ?  ? ?Pertinent Vitals/Pain Pain Assessment ?Pain Assessment: 0-10 ?Pain Score: 9  ?Pain Location: R hip ?Pain Descriptors / Indicators: Sharp ?Pain Intervention(s): Limited activity within patient's tolerance, Repositioned, Monitored during session  ? ? ? ?Hand Dominance Right ?  ?Extremity/Trunk Assessment Upper Extremity Assessment ?Upper Extremity Assessment: Generalized weakness ?  ?Lower Extremity Assessment ?Lower Extremity Assessment: Defer to PT evaluation ?  ?Cervical / Trunk Assessment ?Cervical / Trunk Assessment: Normal ?  ?Communication Communication ?Communication: No difficulties ?  ?Cognition Arousal/Alertness: Awake/alert ?Behavior During Therapy: [facility] for tasks assessed/performed ?Overall Cognitive Status: Within Functional Limits for tasks assessed ?  ?  ?  ?  ?  ?  ?  ?  ?  ?  ?  ?  ?  ?  ?  ?  ?  ?  ?  ?   ? ?  ?   ?  ?    ? ? ?Home Living Family/patient expects to be discharged to:: Private residence ?Living Arrangements: Children ?Available Help at Discharge: Family;Available PRN/intermittently ?Type of Home: Apartment ?Home Access: Level entry ?  ?   ?Home Layout: One level ?  ?  ?Bathroom Shower/Tub: Tub/shower unit ?  ?Bathroom Toilet: Standard ?Bathroom Accessibility: No ?  ?Home Equipment: Rolling Walker (2 wheels);BSC/3in1;Wheelchair - manual ?  ?  ?  ? ?  ?Prior Functioning/Environment Prior Level of Function : Needs assist ?  ?  ?  ?Physical Assist : Mobility (physical);ADLs (physical) ?Mobility (physical): Transfers;Gait;Stairs ?ADLs (physical): Bathing;Dressing;Toileting;IADLs ?Mobility Comments: [place] ambulator with RW. ?ADLs Comments: Pt reports assist for all ADL's but grooming and eating. Pt assisted by family for IADL's as well. ?  ? ?  ?  ?OT Problem List: Decreased strength;Decreased activity tolerance;Impaired balance (sitting and/or standing);Pain ?  ?   ?OT Treatment/Interventions: Self-care/ADL training;Therapeutic exercise;Therapeutic activities;Patient/family education;Balance training;Visual/perceptual remediation/compensation  ?  ?OT Goals(Current goals can be found in the care plan section) Acute Rehab OT Goals ?Patient Stated Goal: Pt open to rehab. ?OT Goal Formulation: With patient ?Time For Goal Achievement: 06/24/21 ?Potential to Achieve Goals: Fair  ?OT Frequency: Min 2X/week ?  ? ?Co-evaluation PT/OT/SLP Co-Evaluation/Treatment: Yes ?Reason for Co-Treatment: To address functional/ADL transfers ?  ?OT goals addressed during session: ADL's and self-care ?  ? ?  ?AM-PAC OT "6 Clicks" Daily Activity     ?Outcome Measure Help from another person eating meals?: None ?Help from another person taking care of  personal grooming?: A Little ?Help from another person toileting, which includes using toliet, bedpan, or urinal?: A Little ?Help from another person bathing (including washing, rinsing, drying)?: A Lot ?Help from another person to put on and taking off regular upper body clothing?: None ?Help from another person to put on and taking off regular lower body clothing?: A Lot ?6 Click Score: 18 ?  ?End of Session Equipment Utilized  During Treatment: Rolling walker (2 wheels) ?Nurse Communication: Mobility status ? ?Activity Tolerance: Patient tolerated treatment well ?Patient left: in chair;with call bell/phone within reach ? ?O

## 2021-06-10 NOTE — Plan of Care (Signed)
?  Problem: Acute Rehab PT Goals(only PT should resolve) ?Goal: Pt Will Go Supine/Side To Sit ?Outcome: Progressing ?Flowsheets (Taken 06/10/2021 1400) ?Pt will go Supine/Side to Sit: ? with supervision ? with min guard assist ?Goal: Patient Will Transfer Sit To/From Stand ?Outcome: Progressing ?Flowsheets (Taken 06/10/2021 1400) ?Patient will transfer sit to/from stand: with min guard assist ?Goal: Pt Will Transfer Bed To Chair/Chair To Bed ?Outcome: Progressing ?Flowsheets (Taken 06/10/2021 1400) ?Pt will Transfer Bed to Chair/Chair to Bed: ? min guard assist ? with min assist ?Goal: Pt Will Ambulate ?Outcome: Progressing ?Flowsheets (Taken 06/10/2021 1400) ?Pt will Ambulate: ? 25 feet ? with minimal assist ? with rolling walker ?  ?2:02 PM, 06/10/21 ?Lonell Grandchild, MPT ?Physical Therapist with [place] ?[facility] ?[phone] office ?[phone] mobile phone ? ?

## 2021-06-10 NOTE — Progress Notes (Signed)
IV access could not be obtained despite multiple attempts, including with US guidance. She does not require IV fluids or IV medications at this time. She is okay to go to the floor without IV.  ?

## 2021-06-10 NOTE — ED Notes (Signed)
This RN has stuck patient multiple times to try and obtain access with no succuess. Another RN attempted and used ultrasound with no success either. Nothing else seen on ultrasound. Trying to obtain access before sending to floor. Hospitalist made aware of this and awaiting orders. ?

## 2021-06-10 NOTE — TOC Initial Note (Signed)
Transition of Care (TOC) - Initial/Assessment Note  ? ? ?Patient Details  ?Name: Erica [name] ?MRN: [id number] ?Date of Birth: 1955/07/10 ? ?Transition of Care (TOC) CM/SW Contact:    ?Reo Portela D, LCSW ?Phone Number: ?06/10/2021, 12:59 PM ? ?Clinical Narrative:                 ?Patient from home with son. Uses a walker at baseline. PT recommends SNF. Patient is agreeable to SNF. Discussed SNF choices. Referral made to SNF of choice.  ? ?Expected Discharge Plan: [place] ?Barriers to Discharge: Continued Medical Work up ? ? ?Patient Goals and CMS Choice ?Patient states their goals for this hospitalization and ongoing recovery are:: SNF then home ?  ?Choice offered to / list presented to : Patient ? ?Expected Discharge Plan and Services ?Expected Discharge Plan: [place] ?  ?  ?Post Acute Care Choice: [place] ?Living arrangements for the past 2 months: Apartment ?                ?  ?  ?  ?  ?  ?  ?  ?  ?  ?  ? ?Prior Living Arrangements/Services ?Living arrangements for the past 2 months: Apartment ?Lives with:: Adult Children ?Patient language and need for interpreter reviewed:: Yes ?Do you feel safe going back to the place where you live?: Yes      ?Need for Family Participation in Patient Care: Yes (Comment) ?Care giver support system in place?: Yes (comment) ?Current home services: DME (rollator, cane, 3n1, w/c) ?Criminal Activity/Legal Involvement Pertinent to Current Situation/Hospitalization: No - Comment as needed ? ?Activities of Daily Living ?Home Assistive Devices/Equipment: Gilford Rile (specify type) (Front wheeled) ?ADL Screening (condition at time of admission) ?Patient's cognitive ability adequate to safely complete daily activities?: Yes ?Is the patient deaf or have difficulty hearing?: No ?Does the patient have difficulty seeing, even when wearing glasses/contacts?: No ?Does the patient have difficulty concentrating, remembering, or making  decisions?: No ?Patient able to express need for assistance with ADLs?: Yes ?Does the patient have difficulty dressing or bathing?: Yes ?Independently performs ADLs?: No ?Communication: Independent ?Dressing (OT): Needs assistance ?Is this a change from baseline?: Pre-admission baseline ?Walks in Home: Independent with device (comment) ?Does the patient have difficulty walking or climbing stairs?: Yes ?Weakness of Legs: Both ?Weakness of Arms/Hands: None ? ?Permission Sought/Granted ?  ?  ?   ?   ?   ?   ? ?Emotional Assessment ?Appearance:: Appears stated age ?  ?Affect (typically observed): Appropriate ?Orientation: : Oriented to Self, Oriented to  Time, Oriented to Place, Oriented to Situation ?Alcohol / Substance Use: Not Applicable ?Psych Involvement: No (comment) ? ?Admission diagnosis:  Fall [W19.XXXA] ?Right hip pain [M25.551] ?Fall, initial encounter [W19.XXXA] ?Patient Active Problem List  ? Diagnosis Date Noted  ? Right hip pain   ? CAD (coronary artery disease) 09/17/2020  ? Altered mental status   ? Acute metabolic encephalopathy 44/02/270  ? Diarrhea   ? Pressure injury of skin 06/09/2020  ? Sacral fracture ([place]) 06/08/2020  ? Thrombocytopenia ([place]) 06/08/2020  ? Hypertensive urgency 06/08/2020  ? (HFpEF) heart failure with preserved ejection fraction ([place]) 06/08/2020  ? Fall 06/08/2020  ? Elevated MCV 06/08/2020  ? Palliative care by specialist   ? Generalized weakness 11/14/2019  ? HCAP (healthcare-associated pneumonia) 11/14/2019  ? Hyperkalemia 11/13/2019  ? Hyponatremia 09/27/2019  ? Closed fracture of right proximal humerus 04/20/2019  ? Surgery, elective   ? Seizure (  [place]) 11/12/2018  ? History of hydronephrosis --stents in place 12/08/2017  ? Stroke ([place]) 11/29/2017  ? Seizures ([place]   ? Hydronephrosis   ? Recurrent UTI   ? Diabetes mellitus type 2 in nonobese [facility])   ? ESRD (end stage renal disease) ([place])   ? Anemia of chronic disease   ? Chronic diastolic congestive heart failure ([place])   ?  Essential hypertension   ? PICC (peripherally inserted central catheter) in place   ? Acute pyelonephritis 11/12/2017  ? Uncontrolled type 2 diabetes mellitus with hyperglycemia, with long-term current use of insulin ([place]) 11/10/2017  ? Renal failure 11/08/2017  ? Seizure disorder ([place]) 11/08/2017  ? Orthostatic hypotension 07/25/2014  ? Moderate protein malnutrition ([place]) 09/15/2013  ? Aspiration pneumonia ([place]) 09/15/2013  ? Acute respiratory failure requiring reintubation ([place]) 09/11/2013  ? probable Seizures due to metabolic disorder 01/75/1025  ? Lactic acidosis 03/19/2013  ? Abdominal pain 03/19/2013  ? Rotavirus infection 10/29/2012  ? Diabetes mellitus with end-stage renal disease ([place]) 10/29/2012  ? Protein-calorie malnutrition, severe ([place]) 10/27/2012  ? NSTEMI (non-ST elevated myocardial infarction) ([place]) 10/26/2012  ? Fever, unspecified 10/26/2012  ? Hypotension 10/25/2012  ? Metabolic acidosis 85/27/7824  ? Chronic diarrhea 10/25/2012  ? Tobacco abuse 10/25/2012  ? DKA (diabetic ketoacidoses) 09/09/2012  ? Dehydration 09/09/2012  ? DKA, type 2 ([place]) 05/20/2012  ? Abnormal LFTs 05/20/2012  ? Heart murmur, systolic 23/53/6144  ? Hypoglycemia 07/08/2011  ? Metabolic encephalopathy 31/54/0086  ? Alcohol abuse 07/08/2011  ? Hypokalemia 07/08/2011  ? Nausea & vomiting 07/08/2011  ? H/O chronic pancreatitis 07/08/2011  ? ?PCP:  System, Provider Not In ?Pharmacy:   ?[facility] [place] ?[place] [address] ?Phone: [phone] Fax: [phone] ? ?[facility] - [place], [place] - [address] ?[place] ?[address] ?[place] [address] ?Phone: [phone] Fax: [phone] ? ?[facility], [place] ?[address] ?[place] [address] ?Phone: [phone] Fax: [phone] ? ? ? ? ?Social Determinants of Health (SDOH) Interventions ?  ? ?Readmission Risk  Interventions ? ?  08/20/2020  ? 11:24 AM 06/11/2020  ? 12:31 PM 11/19/2019  ? 11:36 AM  ?Readmission Risk Prevention Plan  ?Transportation Screening Complete Complete Complete  ?Medication Review Press photographer) Complete Complete Complete  ?PCP or Specialist appointment within 3-5 days of discharge Complete  Complete  ?[place] or Home Care Consult Complete Complete Complete  ?SW Recovery Care/Counseling Consult Complete Complete Complete  ?Palliative Care Screening Not Applicable Not Applicable Complete  ?Skilled Nursing Facility Complete Complete Complete  ? ? ? ?

## 2021-06-10 NOTE — Plan of Care (Signed)
?  Problem: Acute Rehab OT Goals (only OT should resolve) ?Goal: Pt. Will Perform Grooming ?Flowsheets (Taken 06/10/2021 1003) ?Pt Will Perform Grooming: ? with modified independence ? standing ?Goal: Pt. Will Perform Lower Body Dressing ?Flowsheets (Taken 06/10/2021 1003) ?Pt Will Perform Lower Body Dressing: ? with modified independence ? sitting/lateral leans ? with adaptive equipment ?Goal: Pt. Will Transfer To Toilet ?Flowsheets (Taken 06/10/2021 1003) ?Pt Will Transfer to Toilet: ? with modified independence ? stand pivot transfer ?Goal: Pt. Will Perform Toileting-Clothing Manipulation ?Flowsheets (Taken 06/10/2021 1003) ?Pt Will Perform Toileting - Clothing Manipulation and hygiene: ? with modified independence ? sit to/from stand ?Goal: Pt/Caregiver Will Perform Home Exercise Program ?Flowsheets (Taken 06/10/2021 1003) ?Pt/caregiver will Perform Home Exercise Program: ? Increased strength ? Both right and left upper extremity ? Independently ? Leandrew Keech OT, MOT ? ?

## 2021-06-10 NOTE — Progress Notes (Signed)
?PROGRESS NOTE ? ? ? ?Erica [name]  VZD:[id number] DOB: Apr 21, 1955 DOA: 06/09/2021 ?PCP: System, Provider Not In ? ? ?Brief Narrative:  ?Erica [name] is a pleasant 66 y.o. female with medical history significant for type 2 diabetes mellitus, CAD, alcoholism in remission, and ESRD on hemodialysis, now presenting to the emergency department with right hip pain after fall.  She reports slip and fall while using her walker in the kitchen without loss of consciousness other injury presyncopal episode or prodrome.  Imaging unremarkable for fracture, PT OT to evaluate with plan for either discharge home with home health physical therapy versus SNF pending further evaluation. ? ?Assessment & Plan: ?  ?Principal Problem: ?  Fall ?Active Problems: ?  Seizure disorder ([place]) ?  Uncontrolled type 2 diabetes mellitus with hyperglycemia, with long-term current use of insulin ([place]) ?  ESRD (end stage renal disease) ([place]) ? ? ?Acute mechanical fall; right hip pain, fracture ruled out ?- Pt presents with right hip pain after a fall at home  ?- Plain films and CT negative for fractures or dislocation but pt having difficulty ambulating d/t pain  ?-Pain currently well controlled ?-PT OT to follow, awaiting recommendations for possible discharge home with home health PT versus SNF. ?  ?ESRD  ?- Last dialyzed 4/22 without incident per pt  ?- No indication for urgent dialysis at this time- nephrology notified, if remains inpatient will likely need dialysis in the next 24 to 48 hours ?- Restrict fluids, renally-dose medications  ?  ?IDDM  ?-A1c 8.1 October last year, repeat pending ?- Continue sliding scale, hypoglycemic protocol ?  ?Hypertension, essential, uncontrolled  ?-Resume home carvedilol, add amlodipine -titrate as appropriate ?-Hydralazine as needed ? ?Seizure disorder  ?- Continue Keppra  ?  ?DVT prophylaxis: sq heparin  ?Code Status: Full  ?Family Communication: None present ? ?Status is: Observation ? ?Dispo: The  patient is from: Home ?             Anticipated d/c is to: To be determined ?             Anticipated d/c date is: 24 to 48 hours ?             Patient currently not medically stable for discharge ? ?Consultants:  ?Nephrology ? ?Procedures:  ?None ? ?Antimicrobials:  ?None ? ?Subjective: ?No acute issues or events overnight ? ?Objective: ?Vitals:  ? 06/10/21 0045 06/10/21 0132 06/10/21 0133 06/10/21 0554  ?BP: (!) 182/78  (!) 195/86 (!) 184/72  ?Pulse: 81  83 77  ?Resp: 18  19 18   ?Temp: 98.3 ?F (36.8 ?C)  97.7 ?F (36.5 ?C) 98.1 ?F (36.7 ?C)  ?TempSrc:      ?SpO2: 98% 97% 97% 100%  ?Weight:   44.5 kg   ?Height:   5' (1.524 m)   ? ?No intake or output data in the 24 hours ending 06/10/21 0718 ?Filed Weights  ? 06/09/21 1534 06/10/21 0133  ?Weight: 40.8 kg 44.5 kg  ? ? ?Examination: ? ?General exam: Appears calm and comfortable  ?Respiratory system: Clear to auscultation. Respiratory effort normal. ?Cardiovascular system: S1 & S2 heard, RRR. No JVD, murmurs, rubs, gallops or clicks. No pedal edema. ?Gastrointestinal system: Abdomen is nondistended, soft and nontender. No organomegaly or masses felt. Normal bowel sounds heard. ?Central nervous system: Alert and oriented. No focal neurological deficits. ?Extremities: Symmetric 5 x 5 power. ?Skin: No rashes, lesions or ulcers ?Psychiatry: Judgement and insight appear normal. Mood & affect appropriate.  ? ? ? ?  Data Reviewed: I have personally reviewed following labs and imaging studies ? ?CBC: ?Recent Labs  ?Lab 06/07/21 ?0100 06/09/21 ?1716  ?WBC 4.6 5.9  ?NEUTROABS 2.1 3.8  ?HGB 11.8* 9.5*  ?HCT 37.7 31.1*  ?MCV 101.1* 101.6*  ?PLT 120* 126*  ? ?Basic Metabolic Panel: ?Recent Labs  ?Lab 06/07/21 ?0100 06/09/21 ?1716  ?NA 136 138  ?K 4.3 4.1  ?CL 99 107  ?CO2 29 21*  ?GLUCOSE 161* 158*  ?BUN 16 34*  ?CREATININE 4.77* 5.97*  ?CALCIUM 9.2 8.4*  ?MG 2.0  --   ? ?GFR: ?Estimated Creatinine Clearance: 6.5 mL/min (A) (by C-G formula based on SCr of 5.97 mg/dL (H)). ?Liver  Function Tests: ?No results for input(s): AST, ALT, ALKPHOS, BILITOT, PROT, ALBUMIN in the last 168 hours. ?No results for input(s): LIPASE, AMYLASE in the last 168 hours. ?No results for input(s): AMMONIA in the last 168 hours. ?Coagulation Profile: ?No results for input(s): INR, PROTIME in the last 168 hours. ?Cardiac Enzymes: ?No results for input(s): CKTOTAL, CKMB, CKMBINDEX, TROPONINI in the last 168 hours. ?BNP (last 3 results) ?No results for input(s): PROBNP in the last 8760 hours. ?HbA1C: ?No results for input(s): HGBA1C in the last 72 hours. ?CBG: ?Recent Labs  ?Lab 06/10/21 ?0714  ?GLUCAP 122*  ? ?Lipid Profile: ?No results for input(s): CHOL, HDL, LDLCALC, TRIG, CHOLHDL, LDLDIRECT in the last 72 hours. ?Thyroid Function Tests: ?No results for input(s): TSH, T4TOTAL, FREET4, T3FREE, THYROIDAB in the last 72 hours. ?Anemia Panel: ?No results for input(s): VITAMINB12, FOLATE, FERRITIN, TIBC, IRON, RETICCTPCT in the last 72 hours. ?Sepsis Labs: ?No results for input(s): PROCALCITON, LATICACIDVEN in the last 168 hours. ? ?No results found for this or any previous visit (from the past 240 hour(s)).  ? ? ? ? ? ?Radiology Studies: ?CT Hip Right Wo Contrast ? ?Result Date: 06/09/2021 ?CLINICAL DATA:  Trauma, fall EXAM: CT OF THE RIGHT HIP WITHOUT CONTRAST TECHNIQUE: Multidetector CT imaging of the right hip was performed according to the standard protocol. Multiplanar CT image reconstructions were also generated. RADIATION DOSE REDUCTION: This exam was performed according to the departmental dose-optimization program which includes automated exposure control, adjustment of the mA and/or kV according to patient size and/or use of iterative reconstruction technique. COMPARISON:  Radiographs done earlier today FINDINGS: There is previous right hip arthroplasty. Beam hardening artifacts caused by surgical hardware limit evaluation of adjacent structures. There is no free fluid in the pelvic cavity. There is mild  diffuse wall thickening in the rectum. Extensive calcifications are seen in the arcuate arteries in the uterus. No definite recent displaced fractures are seen. Extensive arterial calcifications are seen in the soft tissues. IMPRESSION: Technically difficult examination due to severe beam hardening artifacts caused by surgical hardware used for right hip arthroplasty. As far as seen, no recent displaced fracture is seen. There is no dislocation in the right hip arthroplasty. There is no free fluid in the pelvic cavity. Extensive arterial calcifications are seen. Electronically Signed   By: Elmer Picker M.D.   On: 06/09/2021 18:29  ? ?DG HIPS BILAT WITH PELVIS MIN 5 VIEWS ? ?Result Date: 06/09/2021 ?CLINICAL DATA:  Pt states that she was getting up last night when she got dizzy and fell on the floor, c/o right hip pain Hx of sx on right [id number] EXAM: DG HIP (WITH OR WITHOUT PELVIS) 5+V BILAT COMPARISON:  None. FINDINGS: There is no evidence of hip fracture or dislocation. There is no evidence of arthropathy or other focal bone abnormality.  Unipolar RIGHT hip arthroplasty intact. Vascular calcifications noted. IMPRESSION: No evidence of fracture or dislocation. Unipolar RIGHT hip arthroplasty intact. Electronically Signed   By: Suzy Bouchard M.D.   On: 06/09/2021 16:34   ? ?Scheduled Meds: ? heparin  5,000 Units Subcutaneous Q8H  ? insulin aspart  0-6 Units Subcutaneous TID WC  ? levETIRAcetam  500 mg Oral QHS  ? ? LOS: 0 days  ? ?Time spent: 32min ? ?Little Ishikawa, DO ?[facility] ? ?If 7PM-7AM, please contact night-coverage ?[email or web address] ? ?06/10/2021, 7:18 AM   ? ?  ?

## 2021-06-11 DIAGNOSIS — W19XXXD Unspecified fall, subsequent encounter: Secondary | ICD-10-CM | POA: Diagnosis not present

## 2021-06-11 DIAGNOSIS — G40909 Epilepsy, unspecified, not intractable, without status epilepticus: Secondary | ICD-10-CM | POA: Diagnosis not present

## 2021-06-11 DIAGNOSIS — S79911A Unspecified injury of right hip, initial encounter: Secondary | ICD-10-CM | POA: Diagnosis not present

## 2021-06-11 DIAGNOSIS — N186 End stage renal disease: Secondary | ICD-10-CM | POA: Diagnosis not present

## 2021-06-11 DIAGNOSIS — M25551 Pain in right hip: Secondary | ICD-10-CM | POA: Diagnosis not present

## 2021-06-11 LAB — GLUCOSE, CAPILLARY
Glucose-Capillary: 91 mg/dL (ref 70–99)
Glucose-Capillary: 179 mg/dL — ABNORMAL HIGH (ref 70–99)

## 2021-06-11 MED ORDER — OXYCODONE HCL 5 MG PO TABS: 5.0000 mg | ORAL_TABLET | Freq: Four times a day (QID) | ORAL | 0 refills | Status: DC | PRN

## 2021-06-11 NOTE — Care Management Obs Status (Signed)
MEDICARE OBSERVATION STATUS NOTIFICATION ? ? ?Patient Details  ?Name: Erica [name] ?MRN: [id number] ?Date of Birth: 07-21-1955 ? ? ?Medicare Observation Status Notification Given:  Yes ? ? ? ?Tommy Medal ?06/11/2021, 11:09 AM ?

## 2021-06-11 NOTE — Patient Instructions (Signed)
Repeat all exercises 10-15 times, 1-2 times per day. ? ?1) Shoulder Protraction ? ? ? ?Begin with elbows by your side, slowly "punch" straight out in front of you.   ? ? ? ?2) Shoulder Flexion ? ?Supine:     Standing:  ?      ? ?Begin with arms at your side with thumbs pointed up, slowly raise both arms up and forward towards overhead.  ? ? ? ? ? ? ? ? ? ? ? ? ? ?3) Horizontal abduction/adduction ? ?Supine: ? ? ?Standing: ? ?       ? ? ?Begin with arms straight out in front of you, bring out to the side in at "T" shape. Keep arms straight entire time.  ? ? ? ? ? ? ? ? ? ? ? ? ? ? ? ?4) Internal & External Rotation ? ? Supine:     Standing: ?   ? ?Stand with elbows at the side and elbows bent 90 degrees. Move your forearms away from your body, then bring back inward toward the body.   ? ? ?5) Shoulder Abduction ? ?Supine:     Standing:  ? ? ?  ? ?Lying on your back begin with your arms flat on the table next to your side. Slowly move your arms out to the side so that they go overhead, in a jumping jack or snow angel movement.  ? ? ?6) X to V arms (cheerleader move):  ?Begin with arms straight down, crossed in front of body in an "X". Keeping arms crossed, lift arms straight up overhead. Then spread arms apart into a "V" shape.  ?Bring back together into x and lower down to starting position.  ? ? ? ? ? ?

## 2021-06-11 NOTE — Discharge Summary (Signed)
?Physician Discharge Summary ?  ?Patient: Erica [name] MRN: [id number] DOB: 1955-08-23  ?Admit date:     06/09/2021  ?Discharge date: 06/11/21  ?Discharge Physician: Barton Dubois  ? ?PCP: System, Provider Not In  ? ?Recommendations at discharge:  ?Repeat CBC to follow hemoglobin trend/stability ?Physical rehabilitation/conditioning as per the skilled nursing facility protocol. ?Resume outpatient hemodialysis as previously scheduled ?(Next treatment 06/12/2021). ? ?Discharge Diagnoses: ?Principal Problem: ?  Fall ?Active Problems: ?  Seizure disorder ([place]) ?  Uncontrolled type 2 diabetes mellitus with hyperglycemia, with long-term current use of insulin ([place]) ?  ESRD (end stage renal disease) ([place]) ?Stage I sacral pressure injury ([place]). ? ?Hospital Course: ?As per H&P written by Dr. Myna Hidalgo for 06/09/21 ?Erica [name] is a pleasant 66 y.o. female with medical history significant for type 2 diabetes mellitus, CAD, alcoholism in remission, and ESRD on hemodialysis, now presenting to the emergency department with right hip pain after fall.  The patient reports that she was in her usual state of health when she was going to the sink in her home for some water and her walker slid out from under her and she fell onto her right hip.  She has been experiencing pain at the right hip, worse when trying to bear weight.  She denies losing consciousness and denies any other injury.  She denies any new focal numbness or weakness.  She lives with her son and normally ambulates around her home with a walker.  She reports completing dialysis on 06/07/2021. ?  ?ED Course: Upon arrival to the ED, patient is found to be afebrile and saturating well on room air with normal heart rate and elevated blood pressures.  Plain films of the bilateral hips and pelvis were negative for acute fracture or dislocation and CT, while technically difficult, was negative for fracture or dislocation as well.  Patient was provided analgesics in the ED  and was able to bear some weight, but was having a lot of difficulty ambulating due to the pain and hospitalists were consulted for admission. ? ?Assessment and Plan: ?* Fall ?-Mechanical fall with contusion/bruise to right hip ?-No fracture or surgical intervention. ?-Continue as needed analgesics and physical rehabilitation ?-Patient found in need of inpatient rehabilitation at skilled nursing facility prior to to have safe discharge home. ?-Continue supportive care and maintain adequate hydration. ? ?ESRD (end stage renal disease) ([place]) ?- Continue hemodialysis as per prior to admission schedule (Tuesday-Thursday-Saturday). ?-Next treatment 06/12/2021. ? ?Uncontrolled type 2 diabetes mellitus with hyperglycemia, with long-term current use of insulin ([place]) ?-Continue modified carbohydrate diet and resume home insulin therapy. ?-Continue to follow CBGs and further adjust hypoglycemia regimen as needed. ? ?Seizure disorder ([place]) ?-No seizure activity appreciated throughout hospitalization. ?-Continue Keppra. ? ?Stage I pressure injury: ?-Localizing her sacral wound ?-No signs of superimposed infection ?-Present at time of admission ?-Continue constant repositioning and local care/prevention measures. ? ?Consultants: Nephrology curbside. ?Procedures performed: See below for x-ray reports. ?Disposition: Skilled nursing facility ?Diet recommendation:  ?Renal and modified carbohydrates diet. ? ?DISCHARGE MEDICATION: ?Allergies as of 06/11/2021   ? ?   Reactions  ? Aspirin Palpitations, Other (See Comments)  ? Listed on [facility] 11/12/18  ? Metformin And Related   ? ?  ? ?  ?Medication List  ?  ? ?STOP taking these medications   ? ?HYDROcodone-acetaminophen 5-325 MG tablet ?Commonly known as: NORCO/VICODIN ?  ? ?  ? ?TAKE these medications   ? ?acetaminophen 325 MG tablet ?Commonly known as: TYLENOL ?Take  650 mg by mouth every 4 (four) hours as needed for moderate pain. ?  ?albuterol 108 (90 Base) MCG/ACT inhaler ?Commonly known  as: VENTOLIN HFA ?Inhale 2 puffs into the lungs every 4 (four) hours as needed for wheezing or shortness of breath. ?  ?Biotin 10 MG Tabs ?Take 10 mg by mouth daily. ?  ?calcitRIOL 0.25 MCG capsule ?Commonly known as: ROCALTROL ?Take 1 capsule (0.25 mcg total) by mouth Every Tuesday,Thursday,and Saturday with dialysis. ?  ?carvedilol 12.5 MG tablet ?Commonly known as: COREG ?Take 1 tablet (12.5 mg total) by mouth 2 (two) times daily. ?  ?ferric citrate 1 GM 210 MG(Fe) tablet ?Commonly known as: AURYXIA ?Take 2 tablets (420 mg total) by mouth 3 (three) times daily with meals. ?  ?folic acid 1 MG tablet ?Commonly known as: FOLVITE ?Take 1 mg by mouth every morning. ?  ?hydrALAZINE 50 MG tablet ?Commonly known as: APRESOLINE ?Take 50 mg by mouth every 8 (eight) hours. ?  ?hydrOXYzine 25 MG tablet ?Commonly known as: ATARAX ?Take 25 mg by mouth every 12 (twelve) hours as needed. ?  ?insulin aspart 100 UNIT/ML injection ?Commonly known as: novoLOG ?Inject 1-3 Units into the skin See admin instructions. Inject per sliding scale  ?If BS 151-250= 1 unit ?251- 350= 2 units ?351-400= 3 units ?> 400= 3 units ?Give subcutaneous 4 times a day ?  ?Levemir 100 UNIT/ML injection ?Generic drug: insulin detemir ?Inject 6 Units into the skin daily. ?  ?levETIRAcetam 500 MG tablet ?Commonly known as: KEPPRA ?Take 500 mg by mouth at bedtime. ?  ?oxyCODONE 5 MG immediate release tablet ?Commonly known as: Oxy IR/ROXICODONE ?Take 1 tablet (5 mg total) by mouth every 6 (six) hours as needed for severe pain. ?  ?pantoprazole 40 MG tablet ?Commonly known as: Protonix ?Take 1 tablet (40 mg total) by mouth daily. ?  ?traZODone 150 MG tablet ?Commonly known as: DESYREL ?Take 150 mg by mouth at bedtime. ?  ? ?  ? ? Contact information for follow-up providers   ? ? Carole Civil, MD. Schedule an appointment as soon as possible for a visit .   ?Specialties: Orthopedic Surgery, Radiology ?Contact information: ?[address] ?[place] [address] ?[phone] ? ? ?  ?  ? ?  ?  ? ? Contact information for after-discharge care   ? ? Destination   ? ? [facility] .   ?Service: Skilled Nursing ?Contact information: ?[place] ?[phone] ? ?  ?  ? ?  ?  ? ?  ?  ? ?  ? ?Discharge Exam: ?Filed Weights  ? 06/09/21 1534 06/10/21 0133  ?Weight: 40.8 kg 44.5 kg  ? ?General exam: Alert, awake, oriented x 3; no chest pain, no nausea, no vomiting.  Good saturation on room air. ?Respiratory system: Clear to auscultation. Respiratory effort normal.  No using accessory muscle. ?Cardiovascular system:RRR.  No rubs or gallops; no JVD on exam. ?Gastrointestinal system: Abdomen is nondistended, soft and nontender. No organomegaly or masses felt. Normal bowel sounds heard. ?Central nervous system: Alert and oriented. No focal neurological deficits. ?Extremities: No cyanosis, clubbing or edema. ?Skin: No petechiae. ?Psychiatry: Judgement and insight appear normal. Mood & affect appropriate.  ? ? ?Condition at discharge: Stable and improved. ? ?The results of significant diagnostics from this hospitalization (including imaging, microbiology, ancillary and laboratory) are listed below for reference.  ? ?Imaging Studies: ?CT Head Wo Contrast ? ?Result Date: 06/07/2021 ?CLINICAL DATA:  Headache EXAM: CT HEAD WITHOUT CONTRAST TECHNIQUE: Contiguous axial images were obtained from the base of the skull through the vertex without intravenous contrast. RADIATION DOSE REDUCTION: This exam was performed according to the departmental dose-optimization program which includes automated exposure control, adjustment of the mA and/or kV according to patient size and/or use of iterative reconstruction technique. COMPARISON:  08/15/2020 FINDINGS: Brain: There is no mass, hemorrhage or extra-axial collection. There is generalized atrophy without lobar predilection. Hypodensity of the white matter is most commonly  associated with chronic microvascular disease. Vascular: Atherosclerotic calcification of the internal carotid arteries at the skull base. No abnormal hyperdensity of the major intracranial arteries or dural venous sinuses.

## 2021-06-11 NOTE — TOC Transition Note (Signed)
Transition of Care (TOC) - CM/SW Discharge Note ? ? ?Patient Details  ?Name: Erica [name] ?MRN: [id number] ?Date of Birth: 01-19-1956 ? ?Transition of Care (TOC) CM/SW Contact:  ?Johnica Armwood D, LCSW ?Phone Number: ?06/11/2021, 3:32 PM ? ? ?Clinical Narrative:    ?Discharge clinicals sent to facility. RN to call report. EMS to provide transportation. TOC Signing off.  ? ? ?Final next level of care: [place] ?Barriers to Discharge: No Barriers Identified ? ? ?Patient Goals and CMS Choice ?Patient states their goals for this hospitalization and ongoing recovery are:: SNF then home ?  ?Choice offered to / list presented to : Patient ? ?Discharge Placement ?  ?           ?Patient chooses bed at: [facility] ?Patient to be transferred to facility by: [facility] ?Name of family member notified: son's vm is full, dtr., number is not working ?Patient and family notified of of transfer: 06/11/21 ? ?Discharge Plan and Services ?  ?  ?Post Acute Care Choice: [place]          ?  ?  ?  ?  ?  ?  ?  ?  ?  ?  ? ?Social Determinants of Health (SDOH) Interventions ?  ? ? ?Readmission Risk Interventions ? ?  08/20/2020  ? 11:24 AM 06/11/2020  ? 12:31 PM 11/19/2019  ? 11:36 AM  ?Readmission Risk Prevention Plan  ?Transportation Screening Complete Complete Complete  ?Medication Review Press photographer) Complete Complete Complete  ?PCP or Specialist appointment within 3-5 days of discharge Complete  Complete  ?[place] or Home Care Consult Complete Complete Complete  ?SW Recovery Care/Counseling Consult Complete Complete Complete  ?Palliative Care Screening Not Applicable Not Applicable Complete  ?Skilled Nursing Facility Complete Complete Complete  ? ? ? ? ? ?

## 2021-06-11 NOTE — Assessment & Plan Note (Signed)
-  No seizure activity appreciated throughout hospitalization. ?-Continue Keppra. ?

## 2021-06-11 NOTE — Progress Notes (Signed)
Occupational Therapy Treatment ?Patient Details ?Name: Erica [name] ?MRN: [id number] ?DOB: [birth year] ?Today's Date: 06/11/2021 ? ? ?History of present illness Upon arrival to the ED, patient is found to be afebrile and saturating well on room air with normal heart rate and elevated blood pressures.  Plain films of the bilateral hips and pelvis were negative for acute fracture or dislocation and CT, while technically difficult, was negative for fracture or dislocation as well.  Patient was provided analgesics in the ED and was able to bear some weight, but was having a lot of difficulty ambulating due to the pain and hospitalists were consulted for admission. ?  ?OT comments ? Pt agreeable to OT treatment. Pt slightly more unsteady in standing today with need for moderate assist for transfer to chair and verbal cuing to push up through walker using B UE. Pt note to struggle to bear weight at times through lower extremities. Pt completed seated UE strengthening exercises well and was provided with HEP to complete in room. Pt will benefit from continued OT in the hospital and recommended venue below to increase strength, balance, and endurance for safe ADL's.  ? ?  ? ?Recommendations for follow up therapy are one component of a multi-disciplinary discharge planning process, led by the attending physician.  Recommendations may be updated based on patient status, additional functional criteria and insurance authorization. ?   ?Follow Up Recommendations ? Skilled nursing-short term rehab (<3 hours/day)  ?  ?Assistance Recommended at Discharge Intermittent Supervision/Assistance  ?Patient can return home with the following ? A little help with walking and/or transfers;A lot of help with bathing/dressing/bathroom;Assistance with cooking/housework;Assist for transportation;Help with stairs or ramp for entrance ?  ?Equipment Recommendations ? None recommended by OT  ?  ?Recommendations for Other Services Other (comment)  (optometrist evaluation) ? ?  ?Precautions / Restrictions Precautions ?Precautions: Fall ?Restrictions ?Weight Bearing Restrictions: No  ? ? ?  ? ?Mobility Bed Mobility ?Overal bed mobility: Needs Assistance ?Bed Mobility: Supine to Sit ?  ?  ?Supine to sit: Min assist ?  ?  ?General bed mobility comments: labored movement; need for min A. ?  ? ?Transfers ?Overall transfer level: Needs assistance ?Equipment used: Rolling walker (2 wheels) ?Transfers: Sit to/from Stand, Bed to chair/wheelchair/BSC ?Sit to Stand: Min assist, Mod assist ?  ?  ?Step pivot transfers: Mod assist ?  ?  ?General transfer comment: Slow labored movement with difficulty bearing weight through R LE. Cuting needing for proper use of RW. ?  ?  ?Balance Overall balance assessment: Needs assistance ?Sitting-balance support: Feet supported, No upper extremity supported ?Sitting balance-Leahy Scale: Fair ?Sitting balance - Comments: fair to good seated at EOB ?  ?Standing balance support: Bilateral upper extremity supported, During functional activity, Reliant on assistive device for balance ?Standing balance-Leahy Scale: Fair ?Standing balance comment: poor to fair with RW ?  ?  ?  ?  ?  ?  ?  ?  ?  ?  ?  ?   ? ? ? ? ?Cognition Arousal/Alertness: Awake/alert ?Behavior During Therapy: [facility] for tasks assessed/performed ?Overall Cognitive Status: Within Functional Limits for tasks assessed ?  ?  ?  ?  ?  ?  ?  ?  ?  ?  ?  ?  ?  ?  ?  ?  ?  ?  ?  ?   ?Exercises Exercises: General Upper Extremity ?General Exercises - Upper Extremity ?Shoulder Flexion: AROM, 10 reps, Both, Seated ?Shoulder ABduction: AROM, Both, 10  reps, Seated (x10 external and internal rotation) ?Shoulder Horizontal ABduction: AROM, Both, 10 reps, Seated (x10 protraction ; x10 x to v arms.) ? ?  ?   ? ? ?  ?    ? ? ?Pertinent Vitals/ Pain       Pain Assessment ?Pain Assessment: 0-10 ?Pain Score: 9  ?Pain Location: R hip ?Pain Descriptors / Indicators: Heaviness, Sharp ?Pain  Intervention(s): Limited activity within patient's tolerance, Monitored during session, Repositioned ? ?   ?  ?  ?  ?  ?  ?  ?  ?  ?  ?  ?  ?  ?  ?  ?  ?  ?  ?  ? ?  ?    ?  ?  ?  ?   ? ?Frequency ? Min 2X/week  ? ? ? ? ?  ?Progress Toward Goals ? ?OT Goals(current goals can now be found in the care plan section) ? Progress towards OT goals: Progressing toward goals ? ?Acute Rehab OT Goals ?Patient Stated Goal: Open to rehab. ?OT Goal Formulation: With patient ?Time For Goal Achievement: 06/24/21 ?Potential to Achieve Goals: Fair ?ADL Goals ?Pt Will Perform Grooming: with modified independence;standing ?Pt Will Perform Lower Body Dressing: with modified independence;sitting/lateral leans;with adaptive equipment ?Pt Will Transfer to Toilet: with modified independence;stand pivot transfer ?Pt Will Perform Toileting - Clothing Manipulation and hygiene: with modified independence;sit to/from stand ?Pt/caregiver will Perform Home Exercise Program: Increased strength;Both right and left upper extremity;Independently  ?Plan Discharge plan remains appropriate   ? ?   ?  ?  ?  ?  ? ?  ?   ?  ?  ?  ?  ?  ?  ? ?  ?End of Session Equipment Utilized During Treatment: Rolling walker (2 wheels) ? ?OT Visit Diagnosis: Unsteadiness on feet (R26.81);Other abnormalities of gait and mobility (R26.89);History of falling (Z91.81);Muscle weakness (generalized) (M62.81);Pain ?Pain - Right/Left: Right ?Pain - part of body: Hip ?  ?Activity Tolerance Patient tolerated treatment well ?  ?Patient Left in chair;with call bell/phone within reach;with nursing/sitter in room ?  ?Nurse Communication Other (comment) (NT present to observe mobility) ?  ? ?   ? ?Time: 4888-9169 ?OT Time Calculation (min): 20 min ? ?Charges: OT General Charges ?$OT Visit: 1 Visit ?OT Treatments ?$Therapeutic Exercise: 8-22 mins ? ?Larey Seat OT, MOT ? ?Larey Seat ?06/11/2021, 9:59 AM ?

## 2021-06-11 NOTE — Assessment & Plan Note (Signed)
-  Continue modified carbohydrate diet and resume home insulin therapy. ?-Continue to follow CBGs and further adjust hypoglycemia regimen as needed. ?

## 2021-06-11 NOTE — Plan of Care (Signed)

## 2021-06-11 NOTE — Assessment & Plan Note (Signed)
-   Continue hemodialysis as per prior to admission schedule (Tuesday-Thursday-Saturday). ?-Next treatment 06/12/2021. ?

## 2021-06-11 NOTE — Assessment & Plan Note (Signed)
-  Mechanical fall with contusion/bruise to right hip ?-No fracture or surgical intervention. ?-Continue as needed analgesics and physical rehabilitation ?-Patient found in need of inpatient rehabilitation at skilled nursing facility prior to to have safe discharge home. ?-Continue supportive care and maintain adequate hydration. ?

## 2021-06-19 ENCOUNTER — Inpatient Hospital Stay (HOSPITAL_COMMUNITY)
Admission: EM | Admit: 2021-06-19 | Discharge: 2021-06-22 | DRG: 637 | Disposition: A | Discharge status: Skilled Nursing Facility | Payer: Medicare Other | Source: Skilled Nursing Facility | Attending: Internal Medicine | Admitting: Internal Medicine

## 2021-06-19 ENCOUNTER — Other Ambulatory Visit: Payer: Self-pay

## 2021-06-19 ENCOUNTER — Encounter (HOSPITAL_COMMUNITY): Payer: Self-pay | Admitting: *Deleted

## 2021-06-19 DIAGNOSIS — Z9841 Cataract extraction status, right eye: Secondary | ICD-10-CM

## 2021-06-19 DIAGNOSIS — Z79899 Other long term (current) drug therapy: Secondary | ICD-10-CM

## 2021-06-19 DIAGNOSIS — T68XXXA Hypothermia, initial encounter: Secondary | ICD-10-CM | POA: Diagnosis present

## 2021-06-19 DIAGNOSIS — N186 End stage renal disease: Secondary | ICD-10-CM | POA: Diagnosis present

## 2021-06-19 DIAGNOSIS — L89322 Pressure ulcer of left buttock, stage 2: Secondary | ICD-10-CM | POA: Diagnosis present

## 2021-06-19 DIAGNOSIS — G9341 Metabolic encephalopathy: Secondary | ICD-10-CM | POA: Diagnosis present

## 2021-06-19 DIAGNOSIS — R9431 Abnormal electrocardiogram [ECG] [EKG]: Secondary | ICD-10-CM

## 2021-06-19 DIAGNOSIS — I4891 Unspecified atrial fibrillation: Secondary | ICD-10-CM

## 2021-06-19 DIAGNOSIS — Z992 Dependence on renal dialysis: Secondary | ICD-10-CM

## 2021-06-19 DIAGNOSIS — D696 Thrombocytopenia, unspecified: Secondary | ICD-10-CM | POA: Diagnosis present

## 2021-06-19 DIAGNOSIS — E1143 Type 2 diabetes mellitus with diabetic autonomic (poly)neuropathy: Secondary | ICD-10-CM | POA: Diagnosis present

## 2021-06-19 DIAGNOSIS — Y92230 Patient room in hospital as the place of occurrence of the external cause: Secondary | ICD-10-CM | POA: Diagnosis not present

## 2021-06-19 DIAGNOSIS — I10 Essential (primary) hypertension: Secondary | ICD-10-CM | POA: Diagnosis not present

## 2021-06-19 DIAGNOSIS — D649 Anemia, unspecified: Secondary | ICD-10-CM | POA: Diagnosis present

## 2021-06-19 DIAGNOSIS — Z9842 Cataract extraction status, left eye: Secondary | ICD-10-CM

## 2021-06-19 DIAGNOSIS — E559 Vitamin D deficiency, unspecified: Secondary | ICD-10-CM | POA: Diagnosis present

## 2021-06-19 DIAGNOSIS — W07XXXA Fall from chair, initial encounter: Secondary | ICD-10-CM | POA: Diagnosis not present

## 2021-06-19 DIAGNOSIS — E11649 Type 2 diabetes mellitus with hypoglycemia without coma: Principal | ICD-10-CM | POA: Diagnosis present

## 2021-06-19 DIAGNOSIS — E1122 Type 2 diabetes mellitus with diabetic chronic kidney disease: Secondary | ICD-10-CM | POA: Diagnosis present

## 2021-06-19 DIAGNOSIS — L89151 Pressure ulcer of sacral region, stage 1: Secondary | ICD-10-CM | POA: Diagnosis present

## 2021-06-19 DIAGNOSIS — G40909 Epilepsy, unspecified, not intractable, without status epilepticus: Secondary | ICD-10-CM

## 2021-06-19 DIAGNOSIS — Z961 Presence of intraocular lens: Secondary | ICD-10-CM | POA: Diagnosis present

## 2021-06-19 DIAGNOSIS — Z96641 Presence of right artificial hip joint: Secondary | ICD-10-CM | POA: Diagnosis present

## 2021-06-19 DIAGNOSIS — Z888 Allergy status to other drugs, medicaments and biological substances status: Secondary | ICD-10-CM

## 2021-06-19 DIAGNOSIS — I2510 Atherosclerotic heart disease of native coronary artery without angina pectoris: Secondary | ICD-10-CM | POA: Diagnosis present

## 2021-06-19 DIAGNOSIS — K219 Gastro-esophageal reflux disease without esophagitis: Secondary | ICD-10-CM | POA: Diagnosis present

## 2021-06-19 DIAGNOSIS — N2581 Secondary hyperparathyroidism of renal origin: Secondary | ICD-10-CM | POA: Diagnosis present

## 2021-06-19 DIAGNOSIS — E785 Hyperlipidemia, unspecified: Secondary | ICD-10-CM | POA: Diagnosis present

## 2021-06-19 DIAGNOSIS — E1142 Type 2 diabetes mellitus with diabetic polyneuropathy: Secondary | ICD-10-CM | POA: Diagnosis present

## 2021-06-19 DIAGNOSIS — E162 Hypoglycemia, unspecified: Secondary | ICD-10-CM | POA: Diagnosis present

## 2021-06-19 DIAGNOSIS — X31XXXA Exposure to excessive natural cold, initial encounter: Secondary | ICD-10-CM

## 2021-06-19 DIAGNOSIS — Z794 Long term (current) use of insulin: Secondary | ICD-10-CM

## 2021-06-19 DIAGNOSIS — T68XXXD Hypothermia, subsequent encounter: Secondary | ICD-10-CM | POA: Diagnosis not present

## 2021-06-19 DIAGNOSIS — E871 Hypo-osmolality and hyponatremia: Secondary | ICD-10-CM | POA: Diagnosis present

## 2021-06-19 DIAGNOSIS — Z833 Family history of diabetes mellitus: Secondary | ICD-10-CM | POA: Diagnosis not present

## 2021-06-19 DIAGNOSIS — I132 Hypertensive heart and chronic kidney disease with heart failure and with stage 5 chronic kidney disease, or end stage renal disease: Secondary | ICD-10-CM | POA: Diagnosis present

## 2021-06-19 DIAGNOSIS — I5032 Chronic diastolic (congestive) heart failure: Secondary | ICD-10-CM | POA: Diagnosis present

## 2021-06-19 LAB — COMPREHENSIVE METABOLIC PANEL
ALT: 19 U/L (ref 0–44)
AST: 27 U/L (ref 15–41)
Albumin: 3.1 g/dL — ABNORMAL LOW (ref 3.5–5.0)
Alkaline Phosphatase: 137 U/L — ABNORMAL HIGH (ref 38–126)
Anion gap: 8 (ref 5–15)
BUN: 32 mg/dL — ABNORMAL HIGH (ref 8–23)
CO2: 27 mmol/L (ref 22–32)
Calcium: 8.9 mg/dL (ref 8.9–10.3)
Chloride: 99 mmol/L (ref 98–111)
Creatinine, Ser: 5.82 mg/dL — ABNORMAL HIGH (ref 0.44–1.00)
GFR, Estimated: 8 mL/min — ABNORMAL LOW (ref >60)
Glucose, Bld: 94 mg/dL (ref 70–99)
Potassium: 3.6 mmol/L (ref 3.5–5.1)
Sodium: 134 mmol/L — ABNORMAL LOW (ref 135–145)
Total Bilirubin: 0.7 mg/dL (ref 0.3–1.2)
Total Protein: 6.2 g/dL — ABNORMAL LOW (ref 6.5–8.1)

## 2021-06-19 LAB — GLUCOSE, CAPILLARY
Glucose-Capillary: 93 mg/dL (ref 70–99)
Glucose-Capillary: 115 mg/dL — ABNORMAL HIGH (ref 70–99)
Glucose-Capillary: 198 mg/dL — ABNORMAL HIGH (ref 70–99)
Glucose-Capillary: 191 mg/dL — ABNORMAL HIGH (ref 70–99)

## 2021-06-19 LAB — CBC WITH DIFFERENTIAL/PLATELET
Abs Immature Granulocytes: 0.02 10*3/uL (ref 0.00–0.07)
Basophils Absolute: 0 10*3/uL (ref 0.0–0.1)
Basophils Relative: 0 %
Eosinophils Absolute: 0.1 10*3/uL (ref 0.0–0.5)
Eosinophils Relative: 1 %
HCT: 33.4 % — ABNORMAL LOW (ref 36.0–46.0)
Hemoglobin: 10.8 g/dL — ABNORMAL LOW (ref 12.0–15.0)
Immature Granulocytes: 0 %
Lymphocytes Relative: 19 %
Lymphs Abs: 1.3 10*3/uL (ref 0.7–4.0)
MCH: 32 pg (ref 26.0–34.0)
MCHC: 32.3 g/dL (ref 30.0–36.0)
MCV: 99.1 fL (ref 80.0–100.0)
Monocytes Absolute: 0.7 10*3/uL (ref 0.1–1.0)
Monocytes Relative: 11 %
Neutro Abs: 4.7 10*3/uL (ref 1.7–7.7)
Neutrophils Relative %: 69 %
Platelets: 150 10*3/uL (ref 150–400)
RBC: 3.37 MIL/uL — ABNORMAL LOW (ref 3.87–5.11)
RDW: 14.9 % (ref 11.5–15.5)
WBC: 6.9 10*3/uL (ref 4.0–10.5)
nRBC: 0 % (ref 0.0–0.2)

## 2021-06-19 LAB — HEMOGLOBIN A1C
Hgb A1c MFr Bld: 6.4 % — ABNORMAL HIGH (ref 4.8–5.6)
Mean Plasma Glucose: 136.98 mg/dL

## 2021-06-19 LAB — CBG MONITORING, ED
Glucose-Capillary: 74 mg/dL (ref 70–99)
Glucose-Capillary: 245 mg/dL — ABNORMAL HIGH (ref 70–99)
Glucose-Capillary: 77 mg/dL (ref 70–99)
Glucose-Capillary: 139 mg/dL — ABNORMAL HIGH (ref 70–99)
Glucose-Capillary: 161 mg/dL — ABNORMAL HIGH (ref 70–99)
Glucose-Capillary: 103 mg/dL — ABNORMAL HIGH (ref 70–99)
Glucose-Capillary: 70 mg/dL (ref 70–99)

## 2021-06-19 LAB — TSH: TSH: 3.383 u[IU]/mL (ref 0.350–4.500)

## 2021-06-19 LAB — PROCALCITONIN: Procalcitonin: 0.11 ng/mL

## 2021-06-19 LAB — MRSA NEXT GEN BY PCR, NASAL: MRSA by PCR Next Gen: NOT DETECTED

## 2021-06-19 LAB — CORTISOL: Cortisol, Plasma: 25 ug/dL

## 2021-06-19 MED ORDER — ACETAMINOPHEN 325 MG PO TABS
650.0000 mg | ORAL_TABLET | Freq: Four times a day (QID) | ORAL | Status: DC | PRN
  Administered 2021-06-20 – 2021-06-21 (×2): 650 mg via ORAL
  Filled 2021-06-19 (×3): qty 2

## 2021-06-19 MED ORDER — LEVETIRACETAM 500 MG PO TABS
500.0000 mg | ORAL_TABLET | Freq: Every day | ORAL | Status: DC
  Administered 2021-06-19 – 2021-06-21 (×3): 500 mg via ORAL
  Filled 2021-06-19 (×3): qty 1

## 2021-06-19 MED ORDER — CALCITRIOL 0.25 MCG PO CAPS
0.2500 ug | ORAL_CAPSULE | ORAL | Status: DC
  Administered 2021-06-21: 0.25 ug via ORAL
  Filled 2021-06-19: qty 1

## 2021-06-19 MED ORDER — DEXTROSE 50 % IV SOLN
1.0000 | Freq: Once | INTRAVENOUS | Status: AC
  Administered 2021-06-19: 50 mL via INTRAVENOUS
  Filled 2021-06-19: qty 50

## 2021-06-19 MED ORDER — ACETAMINOPHEN 650 MG RE SUPP: 650.0000 mg | Freq: Four times a day (QID) | RECTAL | Status: DC | PRN

## 2021-06-19 MED ORDER — DEXTROSE 10 % IV SOLN: INTRAVENOUS | Status: DC

## 2021-06-19 MED ORDER — PANTOPRAZOLE SODIUM 40 MG PO TBEC
40.0000 mg | DELAYED_RELEASE_TABLET | Freq: Every day | ORAL | Status: DC
  Administered 2021-06-20 – 2021-06-22 (×3): 40 mg via ORAL
  Filled 2021-06-19 (×3): qty 1

## 2021-06-19 MED ORDER — FERRIC CITRATE 1 GM 210 MG(FE) PO TABS
420.0000 mg | ORAL_TABLET | Freq: Three times a day (TID) | ORAL | Status: DC
  Administered 2021-06-19 – 2021-06-21 (×6): 420 mg via ORAL
  Filled 2021-06-19 (×12): qty 2

## 2021-06-19 MED ORDER — HYDROXYZINE HCL 25 MG PO TABS: 25.0000 mg | ORAL_TABLET | Freq: Two times a day (BID) | ORAL | Status: DC | PRN

## 2021-06-19 MED ORDER — SODIUM CHLORIDE 0.9 % IV SOLN
INTRAVENOUS | Status: DC
Start: 2021-06-19 — End: 2021-06-19

## 2021-06-19 MED ORDER — CHLORHEXIDINE GLUCONATE CLOTH 2 % EX PADS
6.0000 | MEDICATED_PAD | Freq: Every day | CUTANEOUS | Status: DC
  Administered 2021-06-20: 6 via TOPICAL

## 2021-06-19 MED ORDER — ONDANSETRON HCL 4 MG/2ML IJ SOLN: 4.0000 mg | Freq: Four times a day (QID) | INTRAMUSCULAR | Status: DC | PRN

## 2021-06-19 MED ORDER — ZINC SULFATE 220 (50 ZN) MG PO CAPS
220.0000 mg | ORAL_CAPSULE | Freq: Every day | ORAL | Status: DC
  Administered 2021-06-19 – 2021-06-22 (×4): 220 mg via ORAL
  Filled 2021-06-19 (×4): qty 1

## 2021-06-19 MED ORDER — ASCORBIC ACID 500 MG PO TABS
500.0000 mg | ORAL_TABLET | Freq: Two times a day (BID) | ORAL | Status: DC
  Administered 2021-06-19 – 2021-06-22 (×7): 500 mg via ORAL
  Filled 2021-06-19 (×7): qty 1

## 2021-06-19 MED ORDER — ONDANSETRON HCL 4 MG PO TABS
4.0000 mg | ORAL_TABLET | Freq: Four times a day (QID) | ORAL | Status: DC | PRN
Start: 2021-06-19 — End: 2021-06-22

## 2021-06-19 MED ORDER — HYDRALAZINE HCL 20 MG/ML IJ SOLN
10.0000 mg | Freq: Four times a day (QID) | INTRAMUSCULAR | Status: DC | PRN
  Administered 2021-06-19 – 2021-06-21 (×3): 10 mg via INTRAVENOUS
  Filled 2021-06-19 (×3): qty 1

## 2021-06-19 MED ORDER — HEPARIN SODIUM (PORCINE) 5000 UNIT/ML IJ SOLN
5000.0000 [IU] | Freq: Three times a day (TID) | INTRAMUSCULAR | Status: DC
  Administered 2021-06-19 – 2021-06-22 (×9): 5000 [IU] via SUBCUTANEOUS
  Filled 2021-06-19 (×9): qty 1

## 2021-06-19 MED ORDER — FOLIC ACID 1 MG PO TABS
1.0000 mg | ORAL_TABLET | Freq: Every morning | ORAL | Status: DC
  Administered 2021-06-19 – 2021-06-22 (×4): 1 mg via ORAL
  Filled 2021-06-19 (×4): qty 1

## 2021-06-19 MED ORDER — TRAZODONE HCL 50 MG PO TABS
150.0000 mg | ORAL_TABLET | Freq: Every day | ORAL | Status: DC
Start: 2021-06-19 — End: 2021-06-22
  Administered 2021-06-19 – 2021-06-21 (×3): 150 mg via ORAL
  Filled 2021-06-19 (×3): qty 3

## 2021-06-19 MED ORDER — PRO-STAT 64 PO LIQD
30.0000 mL | Freq: Every morning | ORAL | Status: DC
  Administered 2021-06-20: 30 mL via ORAL

## 2021-06-19 NOTE — Hospital Course (Addendum)
66 year old female with a history of ESRD (TTS), diabetes mellitus type 2, coronary disease, hypertension, hyperlipidemia, seizure disorder presenting from [facility] secondary to altered mental status.  Apparently, the facility checked the patient's glucose and it was 39.  The patient was given oral glucose and glucagon.  Upon EMS arrival, CBG was 49 which improved to 82 after oral glucose again.  In the ED, the patient had CBG 77 upon arrival.  The patient herself does not know why she is in the hospital, but is awake and alert and conversant.  She knows that she is in the hospital and that she normally resides at [facility].  She denies any fevers, headache, chest pain, shortness breath, cough, hemoptysis, nausea, vomiting, diarrhea, abdominal pain.  She does not make any urine.  The patient also recognizes that she usually has dialysis on Tuesday, Thursday, and Saturdays. ?In the ED, the patient was initially hypothermic with a temperature of 88.1 ?F.  She is hemodynamically stable with oxygen saturation 100% on room air.  BMP showed sodium 134, potassium 3.6, bicarbonate 27, serum creatinine 5.2.  LFTs were unremarkable.  WBC 6.9, hemoglobin 10.8, platelets 150,000.  EKG showed artifactual baseline with nonspecific ST changes.  The patient was given D50 and started on IV fluids.  The patient was admitted for further evaluation and treatment of her altered mental status and hypoglycemia. ? ?She was started on D10 drip temporarily with improvement of her CBGs and mental status.  She was placed on warming blanket which was removed when she was no longer hypothermic.  Am cortisol and TSH were unrevealing.  Infection work up was unrevealing.  Her D10W was discontinued and her CBGs remained stable without any further hypoglycemia.  Her levemir was discontinued altogether given her variable po intake and hypoglycemia. She will be kept on sliding scale.  Her BP remained stable and she was started back on coreg and  a lower dose of hydralazine.  She had HD on 06/20/21 and she remained stable on RA.  She is stable to go back to her SNF with her usual dialysis schedule. ?

## 2021-06-19 NOTE — Assessment & Plan Note (Addendum)
Presented with temperature 88.1 ?F ?Continue warming blanket ?A.m. cortisol--10.2 ?TSH 3.383 ?Check PCT--0.11 ?Blood cultures x2 sets--neg to date ?5/5 temp improved, remains hemodynamically stable ?

## 2021-06-19 NOTE — ED Notes (Signed)
Multiple unsuccessful attempts to obtain second set of blood cultures ?

## 2021-06-19 NOTE — Assessment & Plan Note (Signed)
This has been chronic dating back to March 2021 ?Monitor for signs of bleeding ?

## 2021-06-19 NOTE — Assessment & Plan Note (Signed)
Continue Keppra.

## 2021-06-19 NOTE — Progress Notes (Signed)
IV noted left side of neck when admitted to unit from the ED.  ?

## 2021-06-19 NOTE — ED Provider Notes (Signed)
?[facility] ?Provider Note ? ? ?CSN: [id number] ?Arrival date & time: 06/19/21  3614 ? ?  ? ?History ? ?Chief Complaint  ?Patient presents with  ? Hypoglycemia  ? ? ?Erica [name] is a 66 y.o. female. ? ?HPI ?She presents by EMS for evaluation of altered mental status and hypoglycemia.  EMS reports that blood sugar at facility was 39, following which she was treated with oral glucose and glucagon.  EMS did not give any treatment during transport. ?  ? ?Home Medications ?Prior to Admission medications   ?Medication Sig Start Date End Date Taking? Authorizing Provider  ?acetaminophen (TYLENOL) 325 MG tablet Take 650 mg by mouth every 4 (four) hours as needed for moderate pain.   Yes [provider]  ?albuterol (VENTOLIN HFA) 108 (90 Base) MCG/ACT inhaler Inhale 2 puffs into the lungs every 4 (four) hours as needed for wheezing or shortness of breath.    Yes [provider]  ?Amino Acids-Protein Hydrolys (FEEDING SUPPLEMENT, PRO-STAT 64,) LIQD Take 30 mLs by mouth in the morning.   Yes [provider]  ?Biotin 10 MG TABS Take 10 mg by mouth daily.   Yes [provider]  ?calcitRIOL (ROCALTROL) 0.25 MCG capsule Take 1 capsule (0.25 mcg total) by mouth Every Tuesday,Thursday,and Saturday with dialysis. 12/18/17  Yes Love, Ivan Anchors, PA-C  ?carvedilol (COREG) 12.5 MG tablet Take 1 tablet (12.5 mg total) by mouth 2 (two) times daily. 09/24/20  Yes Johnson, Clanford L, MD  ?ferric citrate (AURYXIA) 1 GM 210 MG(Fe) tablet Take 2 tablets (420 mg total) by mouth 3 (three) times daily with meals. 11/15/18  Yes Buriev, Arie Sabina, MD  ?folic acid (FOLVITE) 1 MG tablet Take 1 mg by mouth every morning.   Yes [provider]  ?hydrALAZINE (APRESOLINE) 50 MG tablet Take 50 mg by mouth every 8 (eight) hours. 06/03/21  Yes [provider]  ?hydrOXYzine (ATARAX/VISTARIL) 25 MG tablet Take 25 mg by mouth every 12 (twelve) hours as needed for itching. 12/27/20  Yes  [provider]  ?insulin aspart (NOVOLOG) 100 UNIT/ML injection Inject 1-3 Units into the skin See admin instructions. Inject per sliding scale  ?If BS 151-250= 1 unit ?251- 350= 2 units ?351-400= 3 units ?401-500= 3 units ?Give subcutaneous at bedtime   Yes [provider]  ?LEVEMIR 100 UNIT/ML injection Inject 6 Units into the skin daily. 11/14/20  Yes [provider]  ?levETIRAcetam (KEPPRA) 500 MG tablet Take 500 mg by mouth at bedtime.   Yes [provider]  ?lidocaine-prilocaine (EMLA) cream Apply 1 application. topically as directed. Apply to left arm fistula every tues, thurs, and sat for hemodialysis   Yes [provider]  ?Multiple Vitamin (MULTIVITAMIN ADULT PO) Take 1 tablet by mouth daily.   Yes [provider]  ?oxyCODONE (OXY IR/ROXICODONE) 5 MG immediate release tablet Take 1 tablet (5 mg total) by mouth every 6 (six) hours as needed for severe pain. 06/11/21  Yes Barton Dubois, MD  ?pantoprazole (PROTONIX) 40 MG tablet Take 1 tablet (40 mg total) by mouth daily. 11/19/19 06/19/21 Yes Oswald Hillock, MD  ?traZODone (DESYREL) 150 MG tablet Take 150 mg by mouth at bedtime.   Yes [provider]  ?vitamin C (ASCORBIC ACID) 500 MG tablet Take 500 mg by mouth 2 (two) times daily.   Yes [provider]  ?zinc sulfate 220 (50 Zn) MG capsule Take 220 mg by mouth daily.   Yes [provider]  ?   ? ?  Allergies    ?Aspirin and Metformin and related   ? ?Review of Systems   ?Review of Systems ? ?Physical Exam ?Updated Vital Signs ?BP (!) 144/35   Pulse 84   Temp (!) 95.4 ?F (35.2 ?C) (Rectal)   Resp 12   Ht 5' (1.524 m)   Wt 44.5 kg   SpO2 100%   BMI 19.16 kg/m?  ?Physical Exam ?Vitals and nursing note reviewed.  ?Constitutional:   ?   General: She is in acute distress.  ?   Appearance: She is well-developed. She is ill-appearing. She is not toxic-appearing or diaphoretic.  ?HENT:  ?   Head: Normocephalic and atraumatic.  ?    Right Ear: External ear normal.  ?   Left Ear: External ear normal.  ?Eyes:  ?   Conjunctiva/sclera: Conjunctivae normal.  ?   Pupils: Pupils are equal, round, and reactive to light.  ?Neck:  ?   Trachea: Phonation normal.  ?Cardiovascular:  ?   Rate and Rhythm: Normal rate and regular rhythm.  ?   Heart sounds: Normal heart sounds.  ?Pulmonary:  ?   Effort: Pulmonary effort is normal.  ?   Breath sounds: Normal breath sounds.  ?Abdominal:  ?   General: There is no distension.  ?   Palpations: Abdomen is soft.  ?   Tenderness: There is no abdominal tenderness.  ?Musculoskeletal:     ?   General: Normal range of motion.  ?   Cervical back: Normal range of motion and neck supple.  ?Skin: ?   General: Skin is warm and dry.  ?Neurological:  ?   Mental Status: She is alert.  ?   Cranial Nerves: No cranial nerve deficit.  ?   Motor: No abnormal muscle tone.  ?   Comments: No facial asymmetry or abnormal muscle tone.  Responsive but does not follow commands.  Verbalizes answer to question quickly.  Speech is difficult to understand.  ?Psychiatric:  ?   Comments: Somewhat lethargic but responsive  ? ? ?ED Results / Procedures / Treatments   ?Labs ?(all labs ordered are listed, but only abnormal results are displayed) ?Labs Reviewed  ?COMPREHENSIVE METABOLIC PANEL - Abnormal; Notable for the following components:  ?    Result Value  ? Sodium 134 (*)   ? BUN 32 (*)   ? Creatinine, Ser 5.82 (*)   ? Total Protein 6.2 (*)   ? Albumin 3.1 (*)   ? Alkaline Phosphatase 137 (*)   ? GFR, Estimated 8 (*)   ? All other components within normal limits  ?CBC WITH DIFFERENTIAL/PLATELET - Abnormal; Notable for the following components:  ? RBC 3.37 (*)   ? Hemoglobin 10.8 (*)   ? HCT 33.4 (*)   ? All other components within normal limits  ?CBG MONITORING, ED - Abnormal; Notable for the following components:  ? Glucose-Capillary 245 (*)   ? All other components within normal limits  ?CBG MONITORING, ED - Abnormal; Notable for the following  components:  ? Glucose-Capillary 161 (*)   ? All other components within normal limits  ?CBG MONITORING, ED - Abnormal; Notable for the following components:  ? Glucose-Capillary 139 (*)   ? All other components within normal limits  ?CBG MONITORING, ED - Abnormal; Notable for the following components:  ? Glucose-Capillary 103 (*)   ? All other components within normal limits  ?CULTURE, BLOOD (ROUTINE X 2)  ?CULTURE, BLOOD (ROUTINE X 2)  ?PROCALCITONIN  ?TSH  ?HEMOGLOBIN A1C  ?  CORTISOL  ?CBG MONITORING, ED  ?CBG MONITORING, ED  ? ? ?EKG ?EKG Interpretation ? ?Date/Time:  Thursday Jun 19 2021 09:03:37 EDT ?Ventricular Rate:  96 ?PR Interval:    ?QRS Duration: 126 ?QT Interval:  430 ?QTC Calculation: 487 ?R Axis:   59 ?Text Interpretation: Atrial fibrillation Artifact Nonspecific intraventricular conduction delay Anteroseptal infarct, age indeterminate Baseline wander in lead(s) V1 V5 Since last tracing now in? Atrial fibrillation Otherwise no significant change Confirmed by Daleen Bo [phone]) on 06/19/2021 9:08:16 AM ? ?Radiology ?No results found. ? ?Procedures ?Procedures  ? ? ?Medications Ordered in ED ?Medications  ?0.9 %  sodium chloride infusion ( Intravenous New Bag/Given 06/19/21 1007)  ?dextrose 50 % solution 50 mL (50 mLs Intravenous Given 06/19/21 0942)  ? ? ?ED Course/ Medical Decision Making/ A&P ?  ?    CHA2DS2-VASc Score: 6 ?                   ?Medical Decision Making ?Presenting with hypoglycemia and altered mental status.  She is found to be hypoglycemic on arrival and was treated with warming blanket, followed by D50 after IV access obtained. ? ?Problems Addressed: ?Atrial fibrillation, unspecified type [facility]): acute illness or injury ?   Details: Apparently new onset ?Hypoglycemia: acute illness or injury ?   Details: Apparently recurrent ?Hypothermia, initial encounter: acute illness or injury ?   Details: Likely due to hypoglycemia.  No overt signs for acute infection or sepsis. ? ?Amount and/or  Complexity of Data Reviewed ?Independent Historian: caregiver ?   Details: Patient son by telephone, discussed things with nurse.  He states that the patient has typically hard to treat hypoglycemia. ?Labs: ordered.

## 2021-06-19 NOTE — ED Triage Notes (Signed)
Pt brought in by Center for hypoglycemia; staff reported to ems pt's cbg was 39 and pt was given oral glucose; cbg with ems was 49 and more glucose given; cbg 82 with ems; pt only responsive to verbal stimuli ?

## 2021-06-19 NOTE — Assessment & Plan Note (Addendum)
Holding hydralazine and coreg secondary to soft blood pressure ?--restart coreg 5/5 ?--restart hydralazine at lower dose ?

## 2021-06-19 NOTE — Assessment & Plan Note (Addendum)
Patient last received Levemir on the evening of 06/18/2021 ?Discontinue Levemir altogether ?Patient has been having poor oral intake ?Check A1c--6.4 ?Start D10W temporarily>>discontinue am 5/5 as CBGs no longer low ?Monitor CBGs off D10 W>>no further hypoglycemia ?Allow liberal glycemic control at this point ?

## 2021-06-19 NOTE — Assessment & Plan Note (Addendum)
Nephrology consult for maintenance dialysis ?She dialyzes Tuesday, Thursday, Saturday ?--received HD 06/20/21 ?--remains 100% on RA ?

## 2021-06-19 NOTE — Assessment & Plan Note (Addendum)
Holding Levemir in the setting of hypoglycemia>>will not restart ?Follow CBGs ?

## 2021-06-19 NOTE — Assessment & Plan Note (Addendum)
Automated read = afib ?-personally reviewed>>artifact makes difficult interpretation ?-repeat EKG when not shivering ?-remain on tele>>no afib ?5/5 repeat EKG--sinus, no STT change ?

## 2021-06-19 NOTE — Assessment & Plan Note (Addendum)
Multifactorial including hypoglycemia, hypothermia, possible infectious process, ?Minimize hypnotic medications ?5/5 mental status back to baseline ?

## 2021-06-19 NOTE — Assessment & Plan Note (Signed)
Secondary to ESRD ?Correction with dialysis ?

## 2021-06-19 NOTE — Progress Notes (Signed)
Pt asking about personal belongings. Pt arrived to unit with no belongings present. This RN called and spoke with Nurse at [facility] and confirmed that patient's belonging bags are in her room. Notified patient who is accepting of information.  ?

## 2021-06-20 DIAGNOSIS — E162 Hypoglycemia, unspecified: Secondary | ICD-10-CM

## 2021-06-20 DIAGNOSIS — T68XXXD Hypothermia, subsequent encounter: Secondary | ICD-10-CM

## 2021-06-20 LAB — GLUCOSE, CAPILLARY
Glucose-Capillary: 137 mg/dL — ABNORMAL HIGH (ref 70–99)
Glucose-Capillary: 151 mg/dL — ABNORMAL HIGH (ref 70–99)
Glucose-Capillary: 157 mg/dL — ABNORMAL HIGH (ref 70–99)
Glucose-Capillary: 158 mg/dL — ABNORMAL HIGH (ref 70–99)
Glucose-Capillary: 168 mg/dL — ABNORMAL HIGH (ref 70–99)
Glucose-Capillary: 173 mg/dL — ABNORMAL HIGH (ref 70–99)
Glucose-Capillary: 192 mg/dL — ABNORMAL HIGH (ref 70–99)
Glucose-Capillary: 195 mg/dL — ABNORMAL HIGH (ref 70–99)
Glucose-Capillary: 198 mg/dL — ABNORMAL HIGH (ref 70–99)
Glucose-Capillary: 294 mg/dL — ABNORMAL HIGH (ref 70–99)

## 2021-06-20 LAB — RENAL FUNCTION PANEL
Albumin: 2.5 g/dL — ABNORMAL LOW (ref 3.5–5.0)
Albumin: 2.8 g/dL — ABNORMAL LOW (ref 3.5–5.0)
Anion gap: 11 (ref 5–15)
Anion gap: 11 (ref 5–15)
BUN: 33 mg/dL — ABNORMAL HIGH (ref 8–23)
BUN: 33 mg/dL — ABNORMAL HIGH (ref 8–23)
CO2: 25 mmol/L (ref 22–32)
CO2: 24 mmol/L (ref 22–32)
Calcium: 8.4 mg/dL — ABNORMAL LOW (ref 8.9–10.3)
Calcium: 8.5 mg/dL — ABNORMAL LOW (ref 8.9–10.3)
Chloride: 96 mmol/L — ABNORMAL LOW (ref 98–111)
Chloride: 94 mmol/L — ABNORMAL LOW (ref 98–111)
Creatinine, Ser: 6.29 mg/dL — ABNORMAL HIGH (ref 0.44–1.00)
Creatinine, Ser: 6.64 mg/dL — ABNORMAL HIGH (ref 0.44–1.00)
GFR, Estimated: 7 mL/min — ABNORMAL LOW (ref >60)
GFR, Estimated: 6 mL/min — ABNORMAL LOW (ref >60)
Glucose, Bld: 167 mg/dL — ABNORMAL HIGH (ref 70–99)
Glucose, Bld: 236 mg/dL — ABNORMAL HIGH (ref 70–99)
Phosphorus: 3.1 mg/dL (ref 2.5–4.6)
Phosphorus: 3.5 mg/dL (ref 2.5–4.6)
Potassium: 3.9 mmol/L (ref 3.5–5.1)
Potassium: 3.9 mmol/L (ref 3.5–5.1)
Sodium: 132 mmol/L — ABNORMAL LOW (ref 135–145)
Sodium: 129 mmol/L — ABNORMAL LOW (ref 135–145)

## 2021-06-20 LAB — CBC
HCT: 28.3 % — ABNORMAL LOW (ref 36.0–46.0)
Hemoglobin: 8.9 g/dL — ABNORMAL LOW (ref 12.0–15.0)
MCH: 31.7 pg (ref 26.0–34.0)
MCHC: 31.4 g/dL (ref 30.0–36.0)
MCV: 100.7 fL — ABNORMAL HIGH (ref 80.0–100.0)
Platelets: 117 10*3/uL — ABNORMAL LOW (ref 150–400)
RBC: 2.81 MIL/uL — ABNORMAL LOW (ref 3.87–5.11)
RDW: 15.4 % (ref 11.5–15.5)
WBC: 5.1 10*3/uL (ref 4.0–10.5)
nRBC: 0 % (ref 0.0–0.2)

## 2021-06-20 LAB — IRON AND TIBC
Iron: 40 ug/dL (ref 28–170)
Saturation Ratios: 28 % (ref 10.4–31.8)
TIBC: 144 ug/dL — ABNORMAL LOW (ref 250–450)
UIBC: 104 ug/dL

## 2021-06-20 LAB — HIV ANTIBODY (ROUTINE TESTING W REFLEX): HIV Screen 4th Generation wRfx: NONREACTIVE

## 2021-06-20 LAB — FERRITIN: Ferritin: 1008 ng/mL — ABNORMAL HIGH (ref 11–307)

## 2021-06-20 LAB — CORTISOL-AM, BLOOD: Cortisol - AM: 10.2 ug/dL (ref 6.7–22.6)

## 2021-06-20 LAB — HEPATITIS B SURFACE ANTIGEN: Hepatitis B Surface Ag: NONREACTIVE

## 2021-06-20 LAB — HEPATITIS B SURFACE ANTIBODY,QUALITATIVE: Hep B S Ab: REACTIVE — AB

## 2021-06-20 MED ORDER — CHLORHEXIDINE GLUCONATE CLOTH 2 % EX PADS
6.0000 | MEDICATED_PAD | Freq: Every day | CUTANEOUS | Status: DC
  Administered 2021-06-21 – 2021-06-22 (×2): 6 via TOPICAL

## 2021-06-20 MED ORDER — LIDOCAINE-PRILOCAINE 2.5-2.5 % EX CREA: 1.0000 "application " | TOPICAL_CREAM | CUTANEOUS | Status: DC | PRN

## 2021-06-20 MED ORDER — ALTEPLASE 2 MG IJ SOLR
2.0000 mg | Freq: Once | INTRAMUSCULAR | Status: DC | PRN
  Filled 2021-06-20: qty 2

## 2021-06-20 MED ORDER — SODIUM CHLORIDE 0.9 % IV SOLN: 100.0000 mL | INTRAVENOUS | Status: DC | PRN

## 2021-06-20 MED ORDER — LIDOCAINE HCL (PF) 1 % IJ SOLN: 5.0000 mL | INTRAMUSCULAR | Status: DC | PRN

## 2021-06-20 MED ORDER — PENTAFLUOROPROP-TETRAFLUOROETH EX AERO
1.0000 "application " | INHALATION_SPRAY | CUTANEOUS | Status: DC | PRN
  Filled 2021-06-20: qty 30

## 2021-06-20 MED ORDER — CARVEDILOL 12.5 MG PO TABS
12.5000 mg | ORAL_TABLET | Freq: Two times a day (BID) | ORAL | Status: DC
  Administered 2021-06-20 – 2021-06-22 (×4): 12.5 mg via ORAL
  Filled 2021-06-20 (×4): qty 1

## 2021-06-20 MED ORDER — HEPARIN SODIUM (PORCINE) 1000 UNIT/ML DIALYSIS: 1000.0000 [IU] | INTRAMUSCULAR | Status: DC | PRN

## 2021-06-20 MED ORDER — HEPARIN SODIUM (PORCINE) 1000 UNIT/ML DIALYSIS: 20.0000 [IU]/kg | INTRAMUSCULAR | Status: DC | PRN

## 2021-06-20 NOTE — TOC Initial Note (Signed)
Transition of Care (TOC) - Initial/Assessment Note  ? ? ?Patient Details  ?Name: Erica [name] ?MRN: [id number] ?Date of Birth: 10-16-1955 ? ?Transition of Care (TOC) CM/SW Contact:    ?Shade Flood, LCSW ?Phone Number: ?06/20/2021, 8:12 AM ? ?Clinical Narrative:                 ? ?Pt admitted from [facility] rehab. Pt was discharged to [facility] from [facility] about a week ago. Anticipating pt will return to [facility] to continue rehab when stable for dc. TOC will follow and assist as needed with dc planning. ? ?Expected Discharge Plan: [place] ?Barriers to Discharge: Continued Medical Work up ? ? ?Patient Goals and CMS Choice ?Patient states their goals for this hospitalization and ongoing recovery are:: rehab ?  ?Choice offered to / list presented to : Patient ? ?Expected Discharge Plan and Services ?Expected Discharge Plan: [place] ?In-house Referral: Clinical Social Work ?  ?Post Acute Care Choice: Resumption of Svcs/PTA Provider ?Living arrangements for the past 2 months: [place], [place] ?                ?  ?  ?  ?  ?  ?  ?  ?  ?  ?  ? ?Prior Living Arrangements/Services ?Living arrangements for the past 2 months: [place], [place] ?Lives with:: Adult Children ?Patient language and need for interpreter reviewed:: Yes ?Do you feel safe going back to the place where you live?: Yes      ?Need for Family Participation in Patient Care: Yes (Comment) ?Care giver support system in place?: Yes (comment) ?  ?Criminal Activity/Legal Involvement Pertinent to Current Situation/Hospitalization: No - Comment as needed ? ?Activities of Daily Living ?Home Assistive Devices/Equipment: Gilford Rile (specify type) ?ADL Screening (condition at time of admission) ?Patient's cognitive ability adequate to safely complete daily activities?: Yes ?Is the patient deaf or have difficulty hearing?: No ?Does the patient have difficulty seeing, even when  wearing glasses/contacts?: No ?Does the patient have difficulty concentrating, remembering, or making decisions?: No ?Patient able to express need for assistance with ADLs?: Yes ?Does the patient have difficulty dressing or bathing?: Yes ?Independently performs ADLs?: Yes (appropriate for developmental age) ?Communication: Independent ?Dressing (OT): Needs assistance ?Is this a change from baseline?: Pre-admission baseline ?Walks in Home: Independent with device (comment) ?Does the patient have difficulty walking or climbing stairs?: Yes ?Weakness of Legs: Both ?Weakness of Arms/Hands: Both ? ?Permission Sought/Granted ?  ?  ?   ?   ?   ?   ? ?Emotional Assessment ?  ?  ?  ?Orientation: : Oriented to Self, Oriented to Place, Oriented to  Time, Oriented to Situation ?Alcohol / Substance Use: Not Applicable ?Psych Involvement: No (comment) ? ?Admission diagnosis:  Hypoglycemia [E16.2] ?Hypothermia, initial encounter [T68.XXXA] ?Acute metabolic encephalopathy [D98.33] ?Atrial fibrillation, unspecified type ([place]) [I48.91] ?Cellulitis of right lower extremity [L03.115] ?Patient Active Problem List  ? Diagnosis Date Noted  ? Cellulitis of right lower extremity 06/20/2021  ? Hypothermia 06/19/2021  ? Abnormal EKG 06/19/2021  ? Right hip pain   ? CAD (coronary artery disease) 09/17/2020  ? Altered mental status   ? Acute metabolic encephalopathy 82/50/5397  ? Diarrhea   ? Pressure injury of skin 06/09/2020  ? Sacral fracture ([place]) 06/08/2020  ? Thrombocytopenia ([place]) 06/08/2020  ? Hypertensive urgency 06/08/2020  ? (HFpEF) heart failure with preserved ejection fraction ([place]) 06/08/2020  ? Fall 06/08/2020  ? Elevated  MCV 06/08/2020  ? Palliative care by specialist   ? Generalized weakness 11/14/2019  ? HCAP (healthcare-associated pneumonia) 11/14/2019  ? Hyperkalemia 11/13/2019  ? Hyponatremia 09/27/2019  ? Closed fracture of right proximal humerus 04/20/2019  ? Surgery, elective   ? Seizure ([place]) 11/12/2018  ? History of  hydronephrosis --stents in place 12/08/2017  ? Stroke ([place]) 11/29/2017  ? Seizures ([place])   ? Hydronephrosis   ? Recurrent UTI   ? Diabetes mellitus type 2 in nonobese [facility])   ? ESRD (end stage renal disease) ([place])   ? Anemia of chronic disease   ? Chronic diastolic congestive heart failure ([place])   ? Essential hypertension   ? PICC (peripherally inserted central catheter) in place   ? Acute pyelonephritis 11/12/2017  ? Uncontrolled type 2 diabetes mellitus with hyperglycemia, with long-term current use of insulin ([place]) 11/10/2017  ? Renal failure 11/08/2017  ? Seizure disorder ([place]) 11/08/2017  ? Orthostatic hypotension 07/25/2014  ? Moderate protein malnutrition ([place]) 09/15/2013  ? Aspiration pneumonia ([place]) 09/15/2013  ? Acute respiratory failure requiring reintubation ([place]) 09/11/2013  ? probable Seizures due to metabolic disorder 16/11/9602  ? Lactic acidosis 03/19/2013  ? Abdominal pain 03/19/2013  ? Rotavirus infection 10/29/2012  ? Diabetes mellitus with end-stage renal disease ([place]) 10/29/2012  ? Protein-calorie malnutrition, severe ([place]) 10/27/2012  ? NSTEMI (non-ST elevated myocardial infarction) ([place]) 10/26/2012  ? Fever, unspecified 10/26/2012  ? Hypotension 10/25/2012  ? Metabolic acidosis 54/10/8117  ? Chronic diarrhea 10/25/2012  ? Tobacco abuse 10/25/2012  ? DKA (diabetic ketoacidoses) 09/09/2012  ? Dehydration 09/09/2012  ? DKA, type 2 ([place]) 05/20/2012  ? Abnormal LFTs 05/20/2012  ? Heart murmur, systolic 14/78/2956  ? Hypoglycemia 07/08/2011  ? Metabolic encephalopathy 21/30/8657  ? Alcohol abuse 07/08/2011  ? Hypokalemia 07/08/2011  ? Nausea & vomiting 07/08/2011  ? H/O chronic pancreatitis 07/08/2011  ? ?PCP:  System, Provider Not In ?Pharmacy:   ?[facility] [place], [place] ?[place] [address] ?Phone: [phone] Fax: [phone] ? ?[facility] - [place], [place] - [address] ?[place] ?[address] ?[place] [address] ?Phone: [phone] Fax: [phone] ? ?[place], [place] ?[address] ?[place] [address] ?Phone: [phone] Fax: [phone] ? ? ? ? ?Social Determinants of Health (SDOH) Interventions ?  ? ?Readmission Risk Interventions ? ?  08/20/2020  ? 11:24 AM 06/11/2020  ? 12:31 PM 11/19/2019  ? 11:36 AM  ?Readmission Risk Prevention Plan  ?Transportation Screening Complete Complete Complete  ?Medication Review Press photographer) Complete Complete Complete  ?PCP or Specialist appointment within 3-5 days of discharge Complete  Complete  ?[place] or Home Care Consult Complete Complete Complete  ?SW Recovery Care/Counseling Consult Complete Complete Complete  ?Palliative Care Screening Not Applicable Not Applicable Complete  ?Skilled Nursing Facility Complete Complete Complete  ? ? ? ?

## 2021-06-20 NOTE — Progress Notes (Signed)
?  ?       ?PROGRESS NOTE ? ?Erica [name] DVV:[id number] DOB: [birth year] DOA: 06/19/2021 ?PCP: System, Provider Not In ? ?Brief History:  ?66 year old female with a history of ESRD (TTS), diabetes mellitus type 2, coronary disease, hypertension, hyperlipidemia, seizure disorder presenting from [facility] secondary to altered mental status.  Apparently, the facility checked the patient's glucose and it was 39.  The patient was given oral glucose and glucagon.  Upon EMS arrival, CBG was 49 which improved to 82 after oral glucose again.  In the ED, the patient had CBG 77 upon arrival.  The patient herself does not know why she is in the hospital, but is awake and alert and conversant.  She knows that she is in the hospital and that she normally resides at [facility].  She denies any fevers, headache, chest pain, shortness breath, cough, hemoptysis, nausea, vomiting, diarrhea, abdominal pain.  She does not make any urine.  The patient also recognizes that she usually has dialysis on Tuesday, Thursday, and Saturdays. ?In the ED, the patient was initially hypothermic with a temperature of 88.1 ?F.  She is hemodynamically stable with oxygen saturation 100% on room air.  BMP showed sodium 134, potassium 3.6, bicarbonate 27, serum creatinine 5.2.  LFTs were unremarkable.  WBC 6.9, hemoglobin 10.8, platelets 150,000.  EKG showed artifactual baseline with nonspecific ST changes.  The patient was given D50 and started on IV fluids.  The patient was admitted for further evaluation and treatment of her altered mental status and hypoglycemia.  ? ? ? ?Assessment and Plan: ?* Acute metabolic encephalopathy ?Multifactorial including hypoglycemia, hypothermia, possible infectious process, ?Minimize hypnotic medications ?5/5 mental status back to baseline ? ?Abnormal EKG ?Automated read = afib ?-personally reviewed>>artifact makes difficult interpretation ?-repeat EKG when not shivering ?-remain on tele ? ?Hypothermia ?Presented  with temperature 88.1 ?F ?Continue warming blanket ?A.m. cortisol--10.2 ?TSH 3.383 ?Check PCT--0.11 ?Blood cultures x2 sets--neg to date ?5/5 temp improved, remains hemodynamically stable ? ?Thrombocytopenia ([place]) ?This has been chronic dating back to March 2021 ?Monitor for signs of bleeding ? ?Hyponatremia ?Secondary to ESRD ?Correction with dialysis ? ?ESRD (end stage renal disease) ([place]) ?Nephrology consult for maintenance dialysis ?She dialyzes Tuesday, Thursday, Saturday ?--received HD 06/20/21 ? ?Essential hypertension ?Holding hydralazine and coreg secondary to soft blood pressure ?--restart coreg 5/5 ? ?Seizure disorder ([place]) ?Continue Keppra ? ?Diabetes mellitus with end-stage renal disease ([place]) ?Holding Levemir in the setting of hypoglycemia ?Follow CBGs ? ?Hypoglycemia ?Patient last received Levemir on the evening of 06/18/2021 ?Discontinue Levemir ?Patient has been having poor oral intake ?Check A1c--6.4 ?Start D10W temporarily>>discontinue am 5/5 as CBGs no longer low ?Monitor CBGs off D10 W ?Allow liberal glycemic control at this point ? ? ? ?Family Communication:  no  Family at bedside ? ?Consultants:  renal ? ?Code Status:  FULL  ? ?DVT Prophylaxis:  [place] Heparin  ? ? ?Procedures: ?As Listed in Progress Note Above ? ?Antibiotics: ?None ? ?RN Pressure Injury Documentation: ?Pressure Injury 06/08/20 Sacrum Mid Stage 1 -  Intact skin with non-blanchable redness of a localized area usually over a bony prominence. Non Blanchable Stage 1 Pressure Injury (Active)  ?06/08/20 1112  ?Location: Sacrum  ?Location Orientation: Mid  ?Staging: Stage 1 -  Intact skin with non-blanchable redness of a localized area usually over a bony prominence.  ?Wound Description (Comments): Non Blanchable Stage 1 Pressure Injury  ?Present on Admission: Yes  ?   ?Pressure Injury 06/19/21 Buttocks Left;Mid;Medial Stage 2 -  Partial thickness loss of dermis presenting as a shallow open injury with a red, pink wound bed without slough.  small opening (Active)  ?06/19/21 1415  ?Location: Buttocks  ?Location Orientation: Left;Mid;Medial  ?Staging: Stage 2 -  Partial thickness loss of dermis presenting as a shallow open injury with a red, pink wound bed without slough.  ?Wound Description (Comments): small opening  ?Present on Admission: Yes  ?Dressing Type Foam - Lift dressing to assess site every shift 06/20/21 0740  ? ? ? ? ? ? ?Subjective: ?Patient denies fevers, chills, headache, chest pain, dyspnea, nausea, vomiting, diarrhea, abdominal pain, dysuria, hematuria, hematochezia, and melena. ? ? ?Objective: ?Vitals:  ? 06/20/21 1515 06/20/21 1600 06/20/21 1610 06/20/21 1700  ?BP:   (!) 156/69 (!) 172/80  ?Pulse:  (!) 106 99 98  ?Resp:  17 14 19   ?Temp:      ?TempSrc:      ?SpO2:  99% 100% 100%  ?Weight: 47.5 kg     ?Height:      ? ? ?Intake/Output Summary (Last 24 hours) at 06/20/2021 1804 ?Last data filed at 06/20/2021 1700 ?Gross per 24 hour  ?Intake 1307.17 ml  ?Output 3000 ml  ?Net -1692.83 ml  ? ?Weight change:  ?Exam: ? ?General:  Pt is alert, follows commands appropriately, not in acute distress ?HEENT: No icterus, No thrush, No neck mass, [place]/AT ?Cardiovascular: RRR, S1/S2, no rubs, no gallops ?Respiratory: CTA bilaterally, no wheezing, no crackles, no rhonchi ?Abdomen: Soft/+BS, non tender, non distended, no guarding ?Extremities: No edema, No lymphangitis, No petechiae, No rashes, no synovitis ? ? ?Data Reviewed: ?I have personally reviewed following labs and imaging studies ?Basic Metabolic Panel: ?Recent Labs  ?Lab 06/19/21 ?6503 06/20/21 ?0434 06/20/21 ?0845  ?NA 134* 132* 129*  ?K 3.6 3.9 3.9  ?CL 99 96* 94*  ?CO2 27 25 24   ?GLUCOSE 94 167* 236*  ?BUN 32* 33* 33*  ?CREATININE 5.82* 6.29* 6.64*  ?CALCIUM 8.9 8.4* 8.5*  ?PHOS  --  3.1 3.5  ? ?Liver Function Tests: ?Recent Labs  ?Lab 06/19/21 ?5465 06/20/21 ?0434 06/20/21 ?0845  ?AST 27  --   --   ?ALT 19  --   --   ?ALKPHOS 137*  --   --   ?BILITOT 0.7  --   --   ?PROT 6.2*  --   --    ?ALBUMIN 3.1* 2.5* 2.8*  ? ?No results for input(s): LIPASE, AMYLASE in the last 168 hours. ?No results for input(s): AMMONIA in the last 168 hours. ?Coagulation Profile: ?No results for input(s): INR, PROTIME in the last 168 hours. ?CBC: ?Recent Labs  ?Lab 06/19/21 ?6812 06/20/21 ?0845  ?WBC 6.9 5.1  ?NEUTROABS 4.7  --   ?HGB 10.8* 8.9*  ?HCT 33.4* 28.3*  ?MCV 99.1 100.7*  ?PLT 150 117*  ? ?Cardiac Enzymes: ?No results for input(s): CKTOTAL, CKMB, CKMBINDEX, TROPONINI in the last 168 hours. ?BNP: ?Invalid input(s): POCBNP ?CBG: ?Recent Labs  ?Lab 06/20/21 ?7517 06/20/21 ?0816 06/20/21 ?1007 06/20/21 ?1152 06/20/21 ?1525  ?GLUCAP 157* 168* 195* 192* 137*  ? ?HbA1C: ?Recent Labs  ?  06/19/21 ?0017  ?HGBA1C 6.4*  ? ?Urine analysis: ?   ?Component Value Date/Time  ? COLORURINE YELLOW 09/27/2019 1310  ? APPEARANCEUR TURBID (A) 09/27/2019 1310  ? LABSPEC 1.013 09/27/2019 1310  ? PHURINE 7.0 09/27/2019 1310  ? GLUCOSEU 50 (A) 09/27/2019 1310  ? HGBUR SMALL (A) 09/27/2019 1310  ? [place] NEGATIVE 09/27/2019 1310  ? [place] NEGATIVE 09/27/2019 1310  ?  PROTEINUR >=300 (A) 09/27/2019 1310  ? UROBILINOGEN 0.2 07/25/2014 2309  ? NITRITE NEGATIVE 09/27/2019 1310  ? LEUKOCYTESUR MODERATE (A) 09/27/2019 1310  ? ?Sepsis Labs: ?@LABRCNTIP (procalcitonin:4,lacticidven:4) ?) ?Recent Results (from the past 240 hour(s))  ?Culture, blood (Routine X 2) w Reflex to ID Panel     Status: None (Preliminary result)  ? Collection Time: 06/19/21  9:08 AM  ? Specimen: Right Antecubital; Blood  ?Result Value Ref Range Status  ? Specimen Description RIGHT ANTECUBITAL  Final  ? Special Requests   Final  ?  BOTTLES DRAWN AEROBIC AND ANAEROBIC Blood Culture adequate volume  ? Culture   Final  ?  NO GROWTH < 24 HOURS ?Performed at [facility], [address] [place], [place] [address] ?  ? Report Status PENDING  Incomplete  ?MRSA Next Gen by PCR, Nasal     Status: None  ? Collection Time: 06/19/21  1:54 PM  ? Specimen: Nasal Mucosa; Nasal Swab   ?Result Value Ref Range Status  ? MRSA by PCR Next Gen NOT DETECTED NOT DETECTED Final  ?  Comment: (NOTE) ?The GeneXpert MRSA Assay (FDA approved for NASAL specimens only), ?is one component of a comprehensi

## 2021-06-20 NOTE — H&P (Signed)
?History and Physical  ? ? ?Patient: Erica [name] SNK:[id number] DOB: February 02, 1956 ?DOA: 06/19/2021 ?DOS: the patient was seen and examined on 06/19/2021 ?PCP: System, Provider Not In  ?Patient coming from: SNF ? ?Chief Complaint:  ?Chief Complaint  ?Patient presents with  ? Hypoglycemia  ? ?HPI: Erica [name] is a 66 year old female with a history of ESRD (TTS), diabetes mellitus type 2, coronary disease, hypertension, hyperlipidemia, seizure disorder presenting from [facility] secondary to altered mental status.  Apparently, the facility checked the patient's glucose and it was 39.  The patient was given oral glucose and glucagon.  Upon EMS arrival, CBG was 49 which improved to 82 after oral glucose again.  In the ED, the patient had CBG 77 upon arrival.  The patient herself does not know why she is in the hospital, but is awake and alert and conversant.  She knows that she is in the hospital and that she normally resides at [facility].  She denies any fevers, headache, chest pain, shortness breath, cough, hemoptysis, nausea, vomiting, diarrhea, abdominal pain.  She does not make any urine.  The patient also recognizes that she usually has dialysis on Tuesday, Thursday, and Saturdays. ?In the ED, the patient was initially hypothermic with a temperature of 88.1 ?F.  She is hemodynamically stable with oxygen saturation 100% on room air.  BMP showed sodium 134, potassium 3.6, bicarbonate 27, serum creatinine 5.2.  LFTs were unremarkable.  WBC 6.9, hemoglobin 10.8, platelets 150,000.  EKG showed artifactual baseline with nonspecific ST changes.  The patient was given D50 and started on IV fluids.  The patient was admitted for further evaluation and treatment of her altered mental status and hypoglycemia. ? ? ?Review of Systems: As mentioned in the history of present illness. All other systems reviewed and are negative. ?Past Medical History:  ?Diagnosis Date  ? Alcohol-induced pancreatitis   ? CAD (coronary  artery disease) 09/17/2020  ? Chronic diarrhea   ? Depression   ? Diabetes mellitus   ? fasting blood sugar 110-120s  ? Dialysis patient [facility])   ? Diastolic CHF ([place])   ? DKA (diabetic ketoacidoses)   ? Gastroparesis   ? GERD (gastroesophageal reflux disease)   ? Heart murmur   ? History of kidney stones   ? Hyperlipidemia   ? Hypertension   ? Hypokalemia   ? Muscle spasm   ? Neuropathic pain   ? Neuropathy   ? Hx: of  ? Pyelonephritis   ? Seizures ([place])   ? Vitamin B12 deficiency   ? Vitamin D deficiency   ? ?Past Surgical History:  ?Procedure Laterality Date  ? AV FISTULA PLACEMENT Left 12/15/2017  ? Procedure: INSERTION OF ARTERIOVENOUS (AV) GORE-TEX GRAFT ARM;  Surgeon: Rosetta Posner, MD;  Location: [place];  Service: Vascular;  Laterality: Left;  ? CATARACT EXTRACTION W/PHACO Right 03/14/2015  ? Procedure: CATARACT EXTRACTION PHACO AND INTRAOCULAR LENS PLACEMENT (IOC);  Surgeon: Baruch Goldmann, MD;  Location: [facility];  Service: Ophthalmology;  Laterality: Right;  CDE:11.13  ? CATARACT EXTRACTION W/PHACO Left 04/11/2015  ? Procedure: CATARACT EXTRACTION PHACO AND INTRAOCULAR LENS PLACEMENT LEFT EYE CDE=9.68;  Surgeon: Baruch Goldmann, MD;  Location: [facility];  Service: Ophthalmology;  Laterality: Left;  ? COLONOSCOPY  02/24/2010  ? cystoscopy with ureteral stent  Bilateral 10/07/2017  ? At [place] CV LINE RIGHT  11/17/2017  ? IR FLUORO GUIDE CV LINE RIGHT  11/22/2017  ? IR REMOVAL TUN CV  CATH W/O FL  01/26/2018  ? IR US GUIDE VASC ACCESS RIGHT  11/17/2017  ? MULTIPLE EXTRACTIONS WITH ALVEOLOPLASTY N/A 06/13/2012  ? Procedure: MULTIPLE EXTRACION WITH ALVEOLOPLASTY EXTRACT: 18, 19, 20, 21, 22, 24, 25, 27, 28, 29, 30, 31;  Surgeon: Gae Bon, DDS;  Location: [facility];  Service: Oral Surgery;  Laterality: N/A;  ? ORIF HUMERUS FRACTURE Right 04/20/2019  ? Procedure: OPEN REDUCTION INTERNAL FIXATION (ORIF) PROXIMAL HUMERUS FRACTURE;  Surgeon: Marchia Bond, MD;  Location: [facility];  Service: Orthopedics;   Laterality: Right;  ? TUBAL LIGATION    ? URETERAL STENT PLACEMENT  09/2017  ? ?Social History:  reports that she has never smoked. Her smokeless tobacco use includes snuff. She reports that she does not currently use alcohol after a past usage of about 1.0 standard drink per week. She reports that she does not use drugs. ? ?Allergies  ?Allergen Reactions  ? Aspirin Palpitations and Other (See Comments)  ?  Listed on [facility] 11/12/18  ? Metformin And Related   ? ? ?Family History  ?Problem Relation Age of Onset  ? Diabetes Sister   ? Chronic Renal Failure Neg Hx   ? ? ?Prior to Admission medications   ?Medication Sig Start Date End Date Taking? Authorizing Provider  ?acetaminophen (TYLENOL) 325 MG tablet Take 650 mg by mouth every 4 (four) hours as needed for moderate pain.   Yes [provider]  ?albuterol (VENTOLIN HFA) 108 (90 Base) MCG/ACT inhaler Inhale 2 puffs into the lungs every 4 (four) hours as needed for wheezing or shortness of breath.    Yes [provider]  ?Amino Acids-Protein Hydrolys (FEEDING SUPPLEMENT, PRO-STAT 64,) LIQD Take 30 mLs by mouth in the morning.   Yes [provider]  ?Biotin 10 MG TABS Take 10 mg by mouth daily.   Yes [provider]  ?calcitRIOL (ROCALTROL) 0.25 MCG capsule Take 1 capsule (0.25 mcg total) by mouth Every Tuesday,Thursday,and Saturday with dialysis. 12/18/17  Yes Love, Ivan Anchors, PA-C  ?carvedilol (COREG) 12.5 MG tablet Take 1 tablet (12.5 mg total) by mouth 2 (two) times daily. 09/24/20  Yes Johnson, Clanford L, MD  ?ferric citrate (AURYXIA) 1 GM 210 MG(Fe) tablet Take 2 tablets (420 mg total) by mouth 3 (three) times daily with meals. 11/15/18  Yes Buriev, Arie Sabina, MD  ?folic acid (FOLVITE) 1 MG tablet Take 1 mg by mouth every morning.   Yes [provider]  ?hydrALAZINE (APRESOLINE) 50 MG tablet Take 50 mg by mouth every 8 (eight) hours. 06/03/21  Yes [provider]  ?hydrOXYzine (ATARAX/VISTARIL) 25 MG tablet Take 25 mg  by mouth every 12 (twelve) hours as needed for itching. 12/27/20  Yes [provider]  ?insulin aspart (NOVOLOG) 100 UNIT/ML injection Inject 1-3 Units into the skin See admin instructions. Inject per sliding scale  ?If BS 151-250= 1 unit ?251- 350= 2 units ?351-400= 3 units ?401-500= 3 units ?Give subcutaneous at bedtime   Yes [provider]  ?LEVEMIR 100 UNIT/ML injection Inject 6 Units into the skin daily. 11/14/20  Yes [provider]  ?levETIRAcetam (KEPPRA) 500 MG tablet Take 500 mg by mouth at bedtime.   Yes [provider]  ?lidocaine-prilocaine (EMLA) cream Apply 1 application. topically as directed. Apply to left arm fistula every tues, thurs, and sat for hemodialysis   Yes [provider]  ?Multiple Vitamin (MULTIVITAMIN ADULT PO) Take 1 tablet by mouth daily.   Yes [provider]  ?oxyCODONE (OXY  IR/ROXICODONE) 5 MG immediate release tablet Take 1 tablet (5 mg total) by mouth every 6 (six) hours as needed for severe pain. 06/11/21  Yes Barton Dubois, MD  ?pantoprazole (PROTONIX) 40 MG tablet Take 1 tablet (40 mg total) by mouth daily. 11/19/19 06/19/21 Yes Oswald Hillock, MD  ?traZODone (DESYREL) 150 MG tablet Take 150 mg by mouth at bedtime.   Yes [provider]  ?vitamin C (ASCORBIC ACID) 500 MG tablet Take 500 mg by mouth 2 (two) times daily.   Yes [provider]  ?zinc sulfate 220 (50 Zn) MG capsule Take 220 mg by mouth daily.   Yes [provider]  ? ? ?Physical Exam: ?Vitals:  ? 06/20/21 1315 06/20/21 1330 06/20/21 1345 06/20/21 1400  ?BP: (!) 152/70 (!) 139/109 (!) 150/55 (!) 159/59  ?Pulse: 90 89 86 84  ?Resp: 18 19 18 13   ?Temp:      ?TempSrc:      ?SpO2: 100% 100% 100% 100%  ?Weight:      ?Height:      ? ?GENERAL:  A&O x 3, NAD, well developed, cooperative, follows commands ?HEENT: [place]/AT, No thrush, No icterus, No oral ulcers ?Neck:  No neck mass, No meningismus, soft, supple ?CV: RRR, no S3, no S4, no rub, no  JVD ?Lungs:  CTA, no wheeze, no rhonchi, good air movement ?Abd: soft/NT +BS, nondistended ?Ext: No edema, no lymphangitis, no cyanosis, no rashes ?Neuro:  CN II-XII intact, strength 4/5 in RUE, RLE, strengt

## 2021-06-20 NOTE — Consult Note (Signed)
[facility] ?Renal Consultation Note  ?  ?Indication for Consultation:  Management of ESRD/hemodialysis; anemia, hypertension/volume and secondary hyperparathyroidism ? ?HPI: Erica [name] is a 66 y.o. female with a PMH significant for HTN, DM, CAD, h/o Etoh abuse (now in remission), and ESRD on HD TTS at [facility], who was brought via EMS from SNF with AMS.  Per EMS she was hypoglycemic at 38 and treated with oral glucose and glucagon.  In the ED, she was awake but did not follow commands.  Repeat glucose was ranging 103-245.  She was admitted for further evaluation and we were consulted to provide dialysis during her hospitalization.  She is more awake and alert this morning.  She knows she is at [facility] but doesn't remember what happened yesterday. ? ?She denies any N/V/D, chest pain, or SOB.  ? ?Past Medical History:  ?Diagnosis Date  ? Alcohol-induced pancreatitis   ? CAD (coronary artery disease) 09/17/2020  ? Chronic diarrhea   ? Depression   ? Diabetes mellitus   ? fasting blood sugar 110-120s  ? Dialysis patient [facility])   ? Diastolic CHF ([place])   ? DKA (diabetic ketoacidoses)   ? Gastroparesis   ? GERD (gastroesophageal reflux disease)   ? Heart murmur   ? History of kidney stones   ? Hyperlipidemia   ? Hypertension   ? Hypokalemia   ? Muscle spasm   ? Neuropathic pain   ? Neuropathy   ? Hx: of  ? Pyelonephritis   ? Seizures ([place])   ? Vitamin B12 deficiency   ? Vitamin D deficiency   ? ?Past Surgical History:  ?Procedure Laterality Date  ? AV FISTULA PLACEMENT Left 12/15/2017  ? Procedure: INSERTION OF ARTERIOVENOUS (AV) GORE-TEX GRAFT ARM;  Surgeon: Rosetta Posner, MD;  Location: [place];  Service: Vascular;  Laterality: Left;  ? CATARACT EXTRACTION W/PHACO Right 03/14/2015  ? Procedure: CATARACT EXTRACTION PHACO AND INTRAOCULAR LENS PLACEMENT (IOC);  Surgeon: Baruch Goldmann, MD;  Location: [facility];  Service: Ophthalmology;  Laterality: Right;  CDE:11.13  ? CATARACT EXTRACTION  W/PHACO Left 04/11/2015  ? Procedure: CATARACT EXTRACTION PHACO AND INTRAOCULAR LENS PLACEMENT LEFT EYE CDE=9.68;  Surgeon: Baruch Goldmann, MD;  Location: [facility];  Service: Ophthalmology;  Laterality: Left;  ? COLONOSCOPY  02/24/2010  ? cystoscopy with ureteral stent  Bilateral 10/07/2017  ? At [place] CV LINE RIGHT  11/17/2017  ? IR FLUORO GUIDE CV LINE RIGHT  11/22/2017  ? IR REMOVAL TUN CV CATH W/O FL  01/26/2018  ? IR US GUIDE VASC ACCESS RIGHT  11/17/2017  ? MULTIPLE EXTRACTIONS WITH ALVEOLOPLASTY N/A 06/13/2012  ? Procedure: MULTIPLE EXTRACION WITH ALVEOLOPLASTY EXTRACT: 18, 19, 20, 21, 22, 24, 25, 27, 28, 29, 30, 31;  Surgeon: Gae Bon, DDS;  Location: [place];  Service: Oral Surgery;  Laterality: N/A;  ? ORIF HUMERUS FRACTURE Right 04/20/2019  ? Procedure: OPEN REDUCTION INTERNAL FIXATION (ORIF) PROXIMAL HUMERUS FRACTURE;  Surgeon: Marchia Bond, MD;  Location: [place];  Service: Orthopedics;  Laterality: Right;  ? TUBAL LIGATION    ? URETERAL STENT PLACEMENT  09/2017  ? ?Family History:   ?Family History  ?Problem Relation Age of Onset  ? Diabetes Sister   ? Chronic Renal Failure Neg Hx   ? ?Social History: ? reports that she has never smoked. Her smokeless tobacco use includes snuff. She reports that she does not currently use alcohol after a past usage of  about 1.0 standard drink per week. She reports that she does not use drugs. ?Allergies  ?Allergen Reactions  ? Aspirin Palpitations and Other (See Comments)  ?  Listed on [facility] 11/12/18  ? Metformin And Related   ? ?Prior to Admission medications   ?Medication Sig Start Date End Date Taking? Authorizing Provider  ?acetaminophen (TYLENOL) 325 MG tablet Take 650 mg by mouth every 4 (four) hours as needed for moderate pain.   Yes [provider]  ?albuterol (VENTOLIN HFA) 108 (90 Base) MCG/ACT inhaler Inhale 2 puffs into the lungs every 4 (four) hours as needed for wheezing or shortness of breath.    Yes [provider]  ?Amino Acids-Protein Hydrolys (FEEDING SUPPLEMENT, PRO-STAT 64,) LIQD Take 30 mLs by mouth in the morning.   Yes [provider]  ?Biotin 10 MG TABS Take 10 mg by mouth daily.   Yes [provider]  ?calcitRIOL (ROCALTROL) 0.25 MCG capsule Take 1 capsule (0.25 mcg total) by mouth Every Tuesday,Thursday,and Saturday with dialysis. 12/18/17  Yes Love, Ivan Anchors, PA-C  ?carvedilol (COREG) 12.5 MG tablet Take 1 tablet (12.5 mg total) by mouth 2 (two) times daily. 09/24/20  Yes Johnson, Clanford L, MD  ?ferric citrate (AURYXIA) 1 GM 210 MG(Fe) tablet Take 2 tablets (420 mg total) by mouth 3 (three) times daily with meals. 11/15/18  Yes Buriev, Arie Sabina, MD  ?folic acid (FOLVITE) 1 MG tablet Take 1 mg by mouth every morning.   Yes [provider]  ?hydrALAZINE (APRESOLINE) 50 MG tablet Take 50 mg by mouth every 8 (eight) hours. 06/03/21  Yes [provider]  ?hydrOXYzine (ATARAX/VISTARIL) 25 MG tablet Take 25 mg by mouth every 12 (twelve) hours as needed for itching. 12/27/20  Yes [provider]  ?insulin aspart (NOVOLOG) 100 UNIT/ML injection Inject 1-3 Units into the skin See admin instructions. Inject per sliding scale  ?If BS 151-250= 1 unit ?251- 350= 2 units ?351-400= 3 units ?401-500= 3 units ?Give subcutaneous at bedtime   Yes [provider]  ?LEVEMIR 100 UNIT/ML injection Inject 6 Units into the skin daily. 11/14/20  Yes [provider]  ?levETIRAcetam (KEPPRA) 500 MG tablet Take 500 mg by mouth at bedtime.   Yes [provider]  ?lidocaine-prilocaine (EMLA) cream Apply 1 application. topically as directed. Apply to left arm fistula every tues, thurs, and sat for hemodialysis   Yes [provider]  ?Multiple Vitamin (MULTIVITAMIN ADULT PO) Take 1 tablet by mouth daily.   Yes [provider]  ?oxyCODONE (OXY IR/ROXICODONE) 5 MG immediate release tablet Take 1 tablet (5 mg total) by mouth every 6 (six) hours as needed for  severe pain. 06/11/21  Yes Barton Dubois, MD  ?pantoprazole (PROTONIX) 40 MG tablet Take 1 tablet (40 mg total) by mouth daily. 11/19/19 06/19/21 Yes Oswald Hillock, MD  ?traZODone (DESYREL) 150 MG tablet Take 150 mg by mouth at bedtime.   Yes [provider]  ?vitamin C (ASCORBIC ACID) 500 MG tablet Take 500 mg by mouth 2 (two) times daily.   Yes [provider]  ?zinc sulfate 220 (50 Zn) MG capsule Take 220 mg by mouth daily.   Yes [provider]  ? ?Current Facility-Administered Medications  ?Medication Dose Route Frequency Provider Last Rate Last Admin  ? 0.9 %  sodium chloride infusion  100 mL Intravenous PRN Donato Heinz, MD      ? 0.9 %  sodium chloride infusion  100 mL Intravenous PRN Donato Heinz,  MD      ? acetaminophen (TYLENOL) tablet 650 mg  650 mg Oral Q6H PRN Tat, David, MD      ? Or  ? acetaminophen (TYLENOL) suppository 650 mg  650 mg Rectal Q6H PRN Tat, Shanon Brow, MD      ? alteplase (CATHFLO ACTIVASE) injection 2 mg  2 mg Intracatheter Once PRN Donato Heinz, MD      ? ascorbic acid (VITAMIN C) tablet 500 mg  500 mg Oral BID Orson Eva, MD   500 mg at 06/20/21 3154  ? [START ON 06/21/2021] calcitRIOL (ROCALTROL) capsule 0.25 mcg  0.25 mcg Oral Q T,Th,Sa-HD Tat, Shanon Brow, MD      ? Chlorhexidine Gluconate Cloth 2 % PADS 6 each  6 each Topical Q0600 Tat, David, MD      ? Chlorhexidine Gluconate Cloth 2 % PADS 6 each  6 each Topical Q0600 Donato Heinz, MD      ? dextrose 10 % infusion   Intravenous Continuous Adefeso, Oladapo, DO 30 mL/hr at 06/20/21 0600 Infusion Verify at 06/20/21 0600  ? feeding supplement (PRO-STAT 64) liquid 30 mL  30 mL Oral q AM Tat, David, MD   30 mL at 06/20/21 0800  ? ferric citrate (AURYXIA) tablet 420 mg  420 mg Oral TID WC Orson Eva, MD   420 mg at 06/20/21 0847  ? folic acid (FOLVITE) tablet 1 mg  1 mg Oral q morning Tat, David, MD   1 mg at 06/20/21 0086  ? heparin injection 1,000 Units  1,000 Units Dialysis PRN Donato Heinz, MD      ? heparin injection 1,000 Units  20 Units/kg Dialysis PRN Donato Heinz, MD      ? heparin injection 5,000 Units  5,000 Units Subcutaneous Franco Collet, MD   5,000 Units at 06/20/21 7619  ? hydr

## 2021-06-20 NOTE — Progress Notes (Signed)
Hyperglycemic , BS 294,  no coverage ; paged oncall to inform ?

## 2021-06-20 NOTE — Care Management Important Message (Signed)
Important Message ? ?Patient Details  ?Name: Erica [name] ?MRN: [id number] ?Date of Birth: 10/24/1955 ? ? ?Medicare Important Message Given:  Yes ? ? ? ? ?Tommy Medal ?06/20/2021, 12:36 PM ?

## 2021-06-20 NOTE — NC FL2 (Signed)
?[place] MEDICAID FL2 LEVEL OF CARE SCREENING TOOL  ?  ? ?IDENTIFICATION  ?Patient Name: ?Erica [name] Birthdate: [birth year] Sex: female Admission Date (Current Location): ?06/19/2021  ?[place] and [place] Number: ? [place] and Address:  ?[facility] [address] [place] ?     Provider Number: ?[id number]  ?Attending Physician Name and Address:  ?Orson Eva, MD ? Relative Name and Phone Number:  ?  ?   ?Current Level of Care: ?Hospital Recommended Level of Care: ?[place] Prior Approval Number: ?  ? ?Date Approved/Denied: ?  PASRR Number: ?  ? ?Discharge Plan: ?SNF ?  ? ?Current Diagnoses: ?Patient Active Problem List  ? Diagnosis Date Noted  ? Cellulitis of right lower extremity 06/20/2021  ? Hypothermia 06/19/2021  ? Abnormal EKG 06/19/2021  ? Right hip pain   ? CAD (coronary artery disease) 09/17/2020  ? Altered mental status   ? Acute metabolic encephalopathy 24/26/8341  ? Diarrhea   ? Pressure injury of skin 06/09/2020  ? Sacral fracture ([place]) 06/08/2020  ? Thrombocytopenia ([place]) 06/08/2020  ? Hypertensive urgency 06/08/2020  ? (HFpEF) heart failure with preserved ejection fraction ([place]) 06/08/2020  ? Fall 06/08/2020  ? Elevated MCV 06/08/2020  ? Palliative care by specialist   ? Generalized weakness 11/14/2019  ? HCAP (healthcare-associated pneumonia) 11/14/2019  ? Hyperkalemia 11/13/2019  ? Hyponatremia 09/27/2019  ? Closed fracture of right proximal humerus 04/20/2019  ? Surgery, elective   ? Seizure ([place]) 11/12/2018  ? History of hydronephrosis --stents in place 12/08/2017  ? Stroke ([place]) 11/29/2017  ? Seizures ([place])   ? Hydronephrosis   ? Recurrent UTI   ? Diabetes mellitus type 2 in nonobese [facility])   ? ESRD (end stage renal disease) ([place])   ? Anemia of chronic disease   ? Chronic diastolic congestive heart failure ([place])   ? Essential hypertension   ? PICC (peripherally inserted central catheter) in place   ? Acute pyelonephritis 11/12/2017  ?  Uncontrolled type 2 diabetes mellitus with hyperglycemia, with long-term current use of insulin ([place]) 11/10/2017  ? Renal failure 11/08/2017  ? Seizure disorder ([place]) 11/08/2017  ? Orthostatic hypotension 07/25/2014  ? Moderate protein malnutrition ([place]) 09/15/2013  ? Aspiration pneumonia ([place]) 09/15/2013  ? Acute respiratory failure requiring reintubation ([place]) 09/11/2013  ? probable Seizures due to metabolic disorder 96/22/2979  ? Lactic acidosis 03/19/2013  ? Abdominal pain 03/19/2013  ? Rotavirus infection 10/29/2012  ? Diabetes mellitus with end-stage renal disease ([place]) 10/29/2012  ? Protein-calorie malnutrition, severe ([place]) 10/27/2012  ? NSTEMI (non-ST elevated myocardial infarction) ([place]) 10/26/2012  ? Fever, unspecified 10/26/2012  ? Hypotension 10/25/2012  ? Metabolic acidosis 89/21/1941  ? Chronic diarrhea 10/25/2012  ? Tobacco abuse 10/25/2012  ? DKA (diabetic ketoacidoses) 09/09/2012  ? Dehydration 09/09/2012  ? DKA, type 2 ([place]) 05/20/2012  ? Abnormal LFTs 05/20/2012  ? Heart murmur, systolic 74/09/1446  ? Hypoglycemia 07/08/2011  ? Metabolic encephalopathy 18/56/3149  ? Alcohol abuse 07/08/2011  ? Hypokalemia 07/08/2011  ? Nausea & vomiting 07/08/2011  ? H/O chronic pancreatitis 07/08/2011  ? ? ?Orientation RESPIRATION BLADDER Height & Weight   ?  ?Self, Time, Situation, Place ? Normal Continent Weight: 111 lb 5.3 oz (50.5 kg) ?Height:  4\' 9"  (144.8 cm)  ?BEHAVIORAL SYMPTOMS/MOOD NEUROLOGICAL BOWEL NUTRITION STATUS  ?    Continent Diet (see dc summary)  ?AMBULATORY STATUS COMMUNICATION OF NEEDS Skin   ?Extensive Assist Verbally Normal ?  ?  ?  ?    ?     ?     ? ? ?  Personal Care Assistance Level of Assistance  ?  Bathing Assistance: Limited assistance ?Feeding assistance: Independent ?Dressing Assistance: Limited assistance ?   ? ?Functional Limitations Info  ?  Sight Info: Adequate ?Hearing Info: Adequate ?Speech Info: Adequate  ? ? ?SPECIAL CARE FACTORS FREQUENCY  ?    ?  ?PT Frequency: 5x week ?OT  Frequency: 3x week ?  ?  ?  ?   ? ? ?Contractures Contractures Info: Not present  ? ? ?Additional Factors Info  ?  Code Status Info: Full ?Allergies Info: Aspirin, Metformin and related ?Psychotropic Info: Trazadone ?  ?  ?   ? ?Current Medications (06/20/2021):  This is the current hospital active medication list ?Current Facility-Administered Medications  ?Medication Dose Route Frequency Provider Last Rate Last Admin  ? acetaminophen (TYLENOL) tablet 650 mg  650 mg Oral Q6H PRN Tat, David, MD      ? Or  ? acetaminophen (TYLENOL) suppository 650 mg  650 mg Rectal Q6H PRN Tat, Shanon Brow, MD      ? ascorbic acid (VITAMIN C) tablet 500 mg  500 mg Oral BID Orson Eva, MD   500 mg at 06/19/21 2143  ? [START ON 06/21/2021] calcitRIOL (ROCALTROL) capsule 0.25 mcg  0.25 mcg Oral Q T,Th,Sa-HD Tat, Shanon Brow, MD      ? Chlorhexidine Gluconate Cloth 2 % PADS 6 each  6 each Topical Q0600 Tat, David, MD      ? Chlorhexidine Gluconate Cloth 2 % PADS 6 each  6 each Topical Q0600 Donato Heinz, MD      ? dextrose 10 % infusion   Intravenous Continuous Adefeso, Oladapo, DO 30 mL/hr at 06/20/21 0600 Infusion Verify at 06/20/21 0600  ? feeding supplement (PRO-STAT 64) liquid 30 mL  30 mL Oral q AM Tat, David, MD   30 mL at 06/20/21 0800  ? ferric citrate (AURYXIA) tablet 420 mg  420 mg Oral TID WC Orson Eva, MD   420 mg at 06/19/21 1735  ? folic acid (FOLVITE) tablet 1 mg  1 mg Oral q morning Tat, David, MD   1 mg at 06/19/21 1431  ? heparin injection 5,000 Units  5,000 Units Subcutaneous Franco Collet, MD   5,000 Units at 06/20/21 7517  ? hydrALAZINE (APRESOLINE) injection 10 mg  10 mg Intravenous Q6H PRN Adefeso, Oladapo, DO   10 mg at 06/19/21 2344  ? hydrOXYzine (ATARAX) tablet 25 mg  25 mg Oral Q12H PRN Tat, Shanon Brow, MD      ? levETIRAcetam (KEPPRA) tablet 500 mg  500 mg Oral [name], MD   500 mg at 06/19/21 2143  ? ondansetron (ZOFRAN) tablet 4 mg  4 mg Oral Q6H PRN Tat, David, MD      ? Or  ? ondansetron (ZOFRAN) injection 4  mg  4 mg Intravenous Q6H PRN Tat, Shanon Brow, MD      ? pantoprazole (PROTONIX) EC tablet 40 mg  40 mg Oral Daily Tat, David, MD      ? traZODone (DESYREL) tablet 150 mg  150 mg Oral [name], MD   150 mg at 06/19/21 2143  ? zinc sulfate capsule 220 mg  220 mg Oral Daily Tat, David, MD   220 mg at 06/19/21 1432  ? ? ? ?Discharge Medications: ?Please see discharge summary for a list of discharge medications. ? ?Relevant Imaging Results: ? ?Relevant Lab Results: ? ? ?Additional Information ?SSN: [id number] ? ?Shade Flood, LCSW ? ? ? ? ?

## 2021-06-21 ENCOUNTER — Inpatient Hospital Stay (HOSPITAL_COMMUNITY): Payer: Medicare Other

## 2021-06-21 DIAGNOSIS — I10 Essential (primary) hypertension: Secondary | ICD-10-CM

## 2021-06-21 LAB — HEPATITIS B SURFACE ANTIBODY, QUANTITATIVE: Hep B S AB Quant (Post): 181.6 m[IU]/mL (ref >9.9)

## 2021-06-21 LAB — GLUCOSE, CAPILLARY
Glucose-Capillary: 102 mg/dL — ABNORMAL HIGH (ref 70–99)
Glucose-Capillary: 107 mg/dL — ABNORMAL HIGH (ref 70–99)
Glucose-Capillary: 126 mg/dL — ABNORMAL HIGH (ref 70–99)
Glucose-Capillary: 150 mg/dL — ABNORMAL HIGH (ref 70–99)
Glucose-Capillary: 186 mg/dL — ABNORMAL HIGH (ref 70–99)
Glucose-Capillary: 194 mg/dL — ABNORMAL HIGH (ref 70–99)
Glucose-Capillary: 61 mg/dL — ABNORMAL LOW (ref 70–99)

## 2021-06-21 MED ORDER — HYDRALAZINE HCL 50 MG PO TABS: 25.0000 mg | ORAL_TABLET | Freq: Three times a day (TID) | ORAL | Status: DC

## 2021-06-21 MED ORDER — INSULIN ASPART 100 UNIT/ML IJ SOLN
0.0000 [IU] | Freq: Three times a day (TID) | INTRAMUSCULAR | Status: DC
  Administered 2021-06-21 (×2): 2 [IU] via SUBCUTANEOUS

## 2021-06-21 MED ORDER — INSULIN ASPART 100 UNIT/ML IJ SOLN
0.0000 [IU] | Freq: Every day | INTRAMUSCULAR | Status: DC
  Administered 2021-06-21: 3 [IU] via SUBCUTANEOUS

## 2021-06-21 MED ORDER — OXYCODONE HCL 5 MG PO TABS: 5.0000 mg | ORAL_TABLET | Freq: Four times a day (QID) | ORAL | 0 refills | Status: DC | PRN

## 2021-06-21 NOTE — Progress Notes (Signed)
Loud crash noted and then patient called out for help at approximately 1300. Came in to room and she was in front of the bedside chair in the floor, stated that she tried to go to the bathroom, asked her about using her call bell and she stated that she forgot and was trying to get to bathroom before she had an incontinent episode. She has been complaining of right hip pain on and off, but not states that she has new pain as she hit her head. Dr. Carles Collet was notified immediately and he has come and seen the patient. No changes in mental status, noted at her baseline while talking to her at bedside.  ?

## 2021-06-21 NOTE — Progress Notes (Signed)
Responded to nursing call:  pt fell getting up from chair without asking for help ?I was notified by secure chat at 1:03pm by nurse ?I came to bedside to to evaluate patient at 1:05 pm ? ? ?Subjective: ?Patient is lucid and communicative, mental status unchanged from earlier this am.  She states she fell onto her buttock and thinks she may have it her head while trying to get out of chair without assistance present.  Pt denies LOC.  Denies cp, sob, visual changes, focal weakness, n/v, dizziness.  She complains of right hip pain, but states this is unchanged from whence she fell back on 06/09/21.  She denies hitting her right hip ? ?Vitals:  ? 06/21/21 0800 06/21/21 0900 06/21/21 1102 06/21/21 1112  ?BP: (!) 134/103  (!) 122/107   ?Pulse: 74  79   ?Resp: 19  16   ?Temp:  97.6 ?F (36.4 ?C)  97.7 ?F (36.5 ?C)  ?TempSrc:  Oral  Oral  ?SpO2: 100%  93%   ?Weight:      ?Height:      ? ?CV--RRR ?Lung-fine bibasilar crackles ?Abd--soft+BS/NT ?Ext--no CCE/  R-hip--no edema, no ecchymosis, no tenderness to palpation ? ? ?Assessment/Plan: ?Fall ?-CT brain ?-xray right hip ? ? ? ? ?Orson Eva, DO ?[facility] ? ?

## 2021-06-21 NOTE — TOC Transition Note (Addendum)
Transition of Care (TOC) - CM/SW Discharge Note ? ? ?Patient Details  ?Name: Erica [name] ?MRN: [id number] ?Date of Birth: [birth year] ? ?Transition of Care [facility]) CM/SW Contact:  ?Joanne Chars, LCSW ?Phone Number: ?06/21/2021, 11:21 AM ? ? ?Clinical Narrative:   Pt discharging to [facility].  RN call [phone] for report.  ? ?1100: CSW spoke with Melissa at [facility] and she is expecting pt.  She asked DC summary be faxed to [phone]. ? ?1135: spoke to charge RN, will fax DC summary.  ? ?1155: [facility] called for transport ? ?1320: per RN, pt fell, needs xray, [facility] transport cancelled ? ?Final next level of care: [place] ?Barriers to Discharge: Barriers Resolved ? ? ?Patient Goals and CMS Choice ?Patient states their goals for this hospitalization and ongoing recovery are:: rehab ?  ?Choice offered to / list presented to : Patient ? ?Discharge Placement ?  ?           ?Patient chooses bed at:  [facility]) ?Patient to be transferred to facility by: [facility] ?Name of family member notified: son [name] ?Patient and family notified of of transfer: 06/21/21 ? ?Discharge Plan and Services ?In-house Referral: Clinical Social Work ?  ?Post Acute Care Choice: Resumption of Svcs/PTA Provider          ?  ?  ?  ?  ?  ?  ?  ?  ?  ?  ? ?Social Determinants of Health (SDOH) Interventions ?  ? ? ?Readmission Risk Interventions ? ?  08/20/2020  ? 11:24 AM 06/11/2020  ? 12:31 PM 11/19/2019  ? 11:36 AM  ?Readmission Risk Prevention Plan  ?Transportation Screening Complete Complete Complete  ?Medication Review Press photographer) Complete Complete Complete  ?PCP or Specialist appointment within 3-5 days of discharge Complete  Complete  ?[place] or Home Care Consult Complete Complete Complete  ?SW Recovery Care/Counseling Consult Complete Complete Complete  ?Palliative Care Screening Not Applicable Not Applicable Complete  ?Skilled Nursing Facility Complete Complete Complete  ? ? ? ? ? ?

## 2021-06-21 NOTE — Progress Notes (Signed)
Was the fall witnessed: No ? ?Patient condition before and after the fall: same, no changes in mental status or physical conditon ? ?Patient's reaction to the fall: CT scan of head and right hip. Had right hip pain previous but stated that she git her head during fall ? ?Name of the doctor that was notified including date and time: Dr, Tat ? ?Any interventions and vital signs: done and documented ? ?

## 2021-06-21 NOTE — Progress Notes (Signed)
Patient resting in bed currently, back from CT. In bed, no changes in mental status. Neuro checks stable. ?

## 2021-06-21 NOTE — Progress Notes (Signed)
Patient son, [name] was notified of the patient's fall today. Was notified of negative results on CT scan. Xrays of hip not available yet. Would like to be called when those results return.  ?

## 2021-06-21 NOTE — Progress Notes (Signed)
CT brain--neg for acute findings ?Xray right hip--Right hip arthroplasty without periprosthetic or acute fracture. ? ?Remains medically stable to return to SNF ?

## 2021-06-21 NOTE — Discharge Summary (Signed)
?Physician Discharge Summary ?  ?Patient: Erica [name] MRN: [id number] DOB: 11-18-1955  ?Admit date:     06/19/2021  ?Discharge date: 06/21/21  ?Discharge Physician: Shanon Brow Olie Dibert  ? ?PCP: System, Provider Not In  ? ?Recommendations at discharge:  ? Please follow up with primary care provider within 1-2 weeks ? Please repeat BMP and CBC in one week ? ? ?Hospital Course: ?66 year old female with a history of ESRD (TTS), diabetes mellitus type 2, coronary disease, hypertension, hyperlipidemia, seizure disorder presenting from [facility] secondary to altered mental status.  Apparently, the facility checked the patient's glucose and it was 39.  The patient was given oral glucose and glucagon.  Upon EMS arrival, CBG was 49 which improved to 82 after oral glucose again.  In the ED, the patient had CBG 77 upon arrival.  The patient herself does not know why she is in the hospital, but is awake and alert and conversant.  She knows that she is in the hospital and that she normally resides at [facility].  She denies any fevers, headache, chest pain, shortness breath, cough, hemoptysis, nausea, vomiting, diarrhea, abdominal pain.  She does not make any urine.  The patient also recognizes that she usually has dialysis on Tuesday, Thursday, and Saturdays. ?In the ED, the patient was initially hypothermic with a temperature of 88.1 ?F.  She is hemodynamically stable with oxygen saturation 100% on room air.  BMP showed sodium 134, potassium 3.6, bicarbonate 27, serum creatinine 5.2.  LFTs were unremarkable.  WBC 6.9, hemoglobin 10.8, platelets 150,000.  EKG showed artifactual baseline with nonspecific ST changes.  The patient was given D50 and started on IV fluids.  The patient was admitted for further evaluation and treatment of her altered mental status and hypoglycemia. ? ?She was started on D10 drip temporarily with improvement of her CBGs and mental status.  She was placed on warming blanket which was removed when she was  no longer hypothermic.  Am cortisol and TSH were unrevealing.  Infection work up was unrevealing.  Her D10W was discontinued and her CBGs remained stable without any further hypoglycemia.  Her levemir was discontinued altogether given her variable po intake and hypoglycemia. She will be kept on sliding scale.  Her BP remained stable and she was started back on coreg and a lower dose of hydralazine.  She had HD on 06/20/21 and she remained stable on RA.  She is stable to go back to her SNF with her usual dialysis schedule. ? ?Assessment and Plan: ?* Acute metabolic encephalopathy ?Multifactorial including hypoglycemia, hypothermia, possible infectious process, ?Minimize hypnotic medications ?5/5 mental status back to baseline ? ?Abnormal EKG ?Automated read = afib ?-personally reviewed>>artifact makes difficult interpretation ?-repeat EKG when not shivering ?-remain on tele>>no afib ?5/5 repeat EKG--sinus, no STT change ? ?Hypothermia ?Presented with temperature 88.1 ?F ?Continue warming blanket ?A.m. cortisol--10.2 ?TSH 3.383 ?Check PCT--0.11 ?Blood cultures x2 sets--neg to date ?5/5 temp improved, remains hemodynamically stable ? ?Thrombocytopenia ([place]) ?This has been chronic dating back to March 2021 ?Monitor for signs of bleeding ? ?Hyponatremia ?Secondary to ESRD ?Correction with dialysis ? ?ESRD (end stage renal disease) ([place]) ?Nephrology consult for maintenance dialysis ?She dialyzes Tuesday, Thursday, Saturday ?--received HD 06/20/21 ?--remains 100% on RA ? ?Essential hypertension ?Holding hydralazine and coreg secondary to soft blood pressure ?--restart coreg 5/5 ?--restart hydralazine at lower dose ? ?Seizure disorder ([place]) ?Continue Keppra ? ?Diabetes mellitus with end-stage renal disease ([place]) ?Holding Levemir in the setting of hypoglycemia>>will not restart ?Follow  CBGs ? ?Hypoglycemia ?Patient last received Levemir on the evening of 06/18/2021 ?Discontinue Levemir altogether ?Patient has been having poor oral  intake ?Check A1c--6.4 ?Start D10W temporarily>>discontinue am 5/5 as CBGs no longer low ?Monitor CBGs off D10 W>>no further hypoglycemia ?Allow liberal glycemic control at this point ? ? ? ? ?  ? ? ?Consultants: renal ?Procedures performed: none  ?Disposition: Skilled nursing facility ?Diet recommendation:  ?Renal diet ?DISCHARGE MEDICATION: ?Allergies as of 06/21/2021   ? ?   Reactions  ? Aspirin Palpitations, Other (See Comments)  ? Listed on [facility] 11/12/18  ? Metformin And Related   ? ?  ? ?  ?Medication List  ?  ? ?STOP taking these medications   ? ?Levemir 100 UNIT/ML injection ?Generic drug: insulin detemir ?  ? ?  ? ?TAKE these medications   ? ?acetaminophen 325 MG tablet ?Commonly known as: TYLENOL ?Take 650 mg by mouth every 4 (four) hours as needed for moderate pain. ?  ?albuterol 108 (90 Base) MCG/ACT inhaler ?Commonly known as: VENTOLIN HFA ?Inhale 2 puffs into the lungs every 4 (four) hours as needed for wheezing or shortness of breath. ?  ?Biotin 10 MG Tabs ?Take 10 mg by mouth daily. ?  ?calcitRIOL 0.25 MCG capsule ?Commonly known as: ROCALTROL ?Take 1 capsule (0.25 mcg total) by mouth Every Tuesday,Thursday,and Saturday with dialysis. ?  ?carvedilol 12.5 MG tablet ?Commonly known as: COREG ?Take 1 tablet (12.5 mg total) by mouth 2 (two) times daily. ?  ?feeding supplement (PRO-STAT 64) Liqd ?Take 30 mLs by mouth in the morning. ?  ?ferric citrate 1 GM 210 MG(Fe) tablet ?Commonly known as: AURYXIA ?Take 2 tablets (420 mg total) by mouth 3 (three) times daily with meals. ?  ?folic acid 1 MG tablet ?Commonly known as: FOLVITE ?Take 1 mg by mouth every morning. ?  ?hydrALAZINE 50 MG tablet ?Commonly known as: APRESOLINE ?Take 0.5 tablets (25 mg total) by mouth every 8 (eight) hours. ?What changed: how much to take ?  ?hydrOXYzine 25 MG tablet ?Commonly known as: ATARAX ?Take 25 mg by mouth every 12 (twelve) hours as needed for itching. ?  ?insulin aspart 100 UNIT/ML injection ?Commonly known as:  novoLOG ?Inject 1-3 Units into the skin See admin instructions. Inject per sliding scale  ?If BS 151-250= 1 unit ?251- 350= 2 units ?351-400= 3 units ?401-500= 3 units ?Give subcutaneous at bedtime ?  ?levETIRAcetam 500 MG tablet ?Commonly known as: KEPPRA ?Take 500 mg by mouth at bedtime. ?  ?lidocaine-prilocaine cream ?Commonly known as: EMLA ?Apply 1 application. topically as directed. Apply to left arm fistula every tues, thurs, and sat for hemodialysis ?  ?MULTIVITAMIN ADULT PO ?Take 1 tablet by mouth daily. ?  ?oxyCODONE 5 MG immediate release tablet ?Commonly known as: Oxy IR/ROXICODONE ?Take 1 tablet (5 mg total) by mouth every 6 (six) hours as needed for severe pain. ?  ?pantoprazole 40 MG tablet ?Commonly known as: Protonix ?Take 1 tablet (40 mg total) by mouth daily. ?  ?traZODone 150 MG tablet ?Commonly known as: DESYREL ?Take 150 mg by mouth at bedtime. ?  ?vitamin C 500 MG tablet ?Commonly known as: ASCORBIC ACID ?Take 500 mg by mouth 2 (two) times daily. ?  ?zinc sulfate 220 (50 Zn) MG capsule ?Take 220 mg by mouth daily. ?  ? ?  ? ? ?Discharge Exam: ?Filed Weights  ? 06/20/21 0414 06/20/21 1100 06/20/21 1515  ?Weight: 50.5 kg 50.5 kg 47.5 kg  ? ?HEENT:  [place]/AT, No thrush,  no icterus ?CV:  RRR, no rub, no S3, no S4 ?Lung:  CTA, no wheeze, no rhonchi ?Abd:  soft/+BS, NT ?Ext:  No edema, no lymphangitis, no synovitis, no rash ? ? ?Condition at discharge: stable ? ?The results of significant diagnostics from this hospitalization (including imaging, microbiology, ancillary and laboratory) are listed below for reference.  ? ?Imaging Studies: ?CT Head Wo Contrast ? ?Result Date: 06/07/2021 ?CLINICAL DATA:  Headache EXAM: CT HEAD WITHOUT CONTRAST TECHNIQUE: Contiguous axial images were obtained from the base of the skull through the vertex without intravenous contrast. RADIATION DOSE REDUCTION: This exam was performed according to the departmental dose-optimization program which includes automated exposure  control, adjustment of the mA and/or kV according to patient size and/or use of iterative reconstruction technique. COMPARISON:  08/15/2020 FINDINGS: Brain: There is no mass, hemorrhage or extra-axial collection. T

## 2021-06-22 DIAGNOSIS — I4891 Unspecified atrial fibrillation: Secondary | ICD-10-CM

## 2021-06-22 LAB — GLUCOSE, CAPILLARY
Glucose-Capillary: 171 mg/dL — ABNORMAL HIGH (ref 70–99)
Glucose-Capillary: 120 mg/dL — ABNORMAL HIGH (ref 70–99)

## 2021-06-22 MED ORDER — PROSOURCE PLUS PO LIQD: 30.0000 mL | Freq: Every morning | ORAL | Status: DC

## 2021-06-22 NOTE — Progress Notes (Signed)
Pt oriented to self and time. Reoriented to place. No complaints of pain at this present time. EMS present at bedside to take back to SNF. Pt informed. IV removed by EMS.  ?

## 2021-06-22 NOTE — Discharge Summary (Signed)
?Physician Discharge Summary ?  ?Patient: Erica [name] MRN: [id number] DOB: 12/30/1955  ?Admit date:     06/19/2021  ?Discharge date: 06/22/21  ?Discharge Physician: Shanon Brow Rashelle Ireland  ? ?PCP: System, Provider Not In  ? ?Recommendations at discharge:  ? Please follow up with primary care provider within 1-2 weeks ? Please repeat BMP and CBC in one week ? ? ? ? ?Hospital Course: ?66 year old female with a history of ESRD (TTS), diabetes mellitus type 2, coronary disease, hypertension, hyperlipidemia, seizure disorder presenting from [facility] secondary to altered mental status.  Apparently, the facility checked the patient's glucose and it was 39.  The patient was given oral glucose and glucagon.  Upon EMS arrival, CBG was 49 which improved to 82 after oral glucose again.  In the ED, the patient had CBG 77 upon arrival.  The patient herself does not know why she is in the hospital, but is awake and alert and conversant.  She knows that she is in the hospital and that she normally resides at [facility].  She denies any fevers, headache, chest pain, shortness breath, cough, hemoptysis, nausea, vomiting, diarrhea, abdominal pain.  She does not make any urine.  The patient also recognizes that she usually has dialysis on Tuesday, Thursday, and Saturdays. ?In the ED, the patient was initially hypothermic with a temperature of 88.1 ?F.  She is hemodynamically stable with oxygen saturation 100% on room air.  BMP showed sodium 134, potassium 3.6, bicarbonate 27, serum creatinine 5.2.  LFTs were unremarkable.  WBC 6.9, hemoglobin 10.8, platelets 150,000.  EKG showed artifactual baseline with nonspecific ST changes.  The patient was given D50 and started on IV fluids.  The patient was admitted for further evaluation and treatment of her altered mental status and hypoglycemia. ? ?She was started on D10 drip temporarily with improvement of her CBGs and mental status.  She was placed on warming blanket which was removed when she  was no longer hypothermic.  Am cortisol and TSH were unrevealing.  Infection work up was unrevealing.  Her D10W was discontinued and her CBGs remained stable without any further hypoglycemia.  Her levemir was discontinued altogether given her variable po intake and hypoglycemia. She will be kept on sliding scale.  Her BP remained stable and she was started back on coreg and a lower dose of hydralazine.  She had HD on 06/20/21 and she remained stable on RA.  She is stable to go back to her SNF with her usual dialysis schedule. ? ?Assessment and Plan: ?* Acute metabolic encephalopathy ?Multifactorial including hypoglycemia, hypothermia, possible infectious process, ?Minimize hypnotic medications ?5/5 mental status back to baseline ? ?Abnormal EKG ?Automated read = afib ?-personally reviewed>>artifact makes difficult interpretation ?-repeat EKG when not shivering ?-remain on tele>>no afib ?5/5 repeat EKG--sinus, no STT change ? ?Hypothermia ?Presented with temperature 88.1 ?F ?Continue warming blanket ?A.m. cortisol--10.2 ?TSH 3.383 ?Check PCT--0.11 ?Blood cultures x2 sets--neg to date ?5/5 temp improved, remains hemodynamically stable ? ?Thrombocytopenia ([place]) ?This has been chronic dating back to March 2021 ?Monitor for signs of bleeding ? ?Hyponatremia ?Secondary to ESRD ?Correction with dialysis ? ?ESRD (end stage renal disease) ([place]) ?Nephrology consult for maintenance dialysis ?She dialyzes Tuesday, Thursday, Saturday ?--received HD 06/20/21 ?--remains 100% on RA ? ?Essential hypertension ?Holding hydralazine and coreg secondary to soft blood pressure ?--restart coreg 5/5 ?--restart hydralazine at lower dose ? ?Seizure disorder ([place]) ?Continue Keppra ? ?Diabetes mellitus with end-stage renal disease ([place]) ?Holding Levemir in the setting of hypoglycemia>>will not  restart ?Follow CBGs ? ?Hypoglycemia ?Patient last received Levemir on the evening of 06/18/2021 ?Discontinue Levemir altogether ?Patient has been having poor  oral intake ?Check A1c--6.4 ?Start D10W temporarily>>discontinue am 5/5 as CBGs no longer low ?Monitor CBGs off D10 W>>no further hypoglycemia ?Allow liberal glycemic control at this point ? ?Fall ?-pt had fall 5/6 after she was d/c.  Discharge delayed due to fall and need for radiographic studies ?-CT brain neg ?-xray right hip--no fracture or dislocation ? ? ? ?  ? ? ?Consultants: renal ?Procedures performed: none  ?Disposition: Nursing home ?Diet recommendation:  ?Renal diet ?DISCHARGE MEDICATION: ?Allergies as of 06/22/2021   ? ?   Reactions  ? Aspirin Palpitations, Other (See Comments)  ? Listed on [facility] 11/12/18  ? Metformin And Related   ? ?  ? ?  ?Medication List  ?  ? ?STOP taking these medications   ? ?Levemir 100 UNIT/ML injection ?Generic drug: insulin detemir ?  ? ?  ? ?TAKE these medications   ? ?acetaminophen 325 MG tablet ?Commonly known as: TYLENOL ?Take 650 mg by mouth every 4 (four) hours as needed for moderate pain. ?  ?albuterol 108 (90 Base) MCG/ACT inhaler ?Commonly known as: VENTOLIN HFA ?Inhale 2 puffs into the lungs every 4 (four) hours as needed for wheezing or shortness of breath. ?  ?Biotin 10 MG Tabs ?Take 10 mg by mouth daily. ?  ?calcitRIOL 0.25 MCG capsule ?Commonly known as: ROCALTROL ?Take 1 capsule (0.25 mcg total) by mouth Every Tuesday,Thursday,and Saturday with dialysis. ?  ?carvedilol 12.5 MG tablet ?Commonly known as: COREG ?Take 1 tablet (12.5 mg total) by mouth 2 (two) times daily. ?  ?feeding supplement (PRO-STAT 64) Liqd ?Take 30 mLs by mouth in the morning. ?  ?ferric citrate 1 GM 210 MG(Fe) tablet ?Commonly known as: AURYXIA ?Take 2 tablets (420 mg total) by mouth 3 (three) times daily with meals. ?  ?folic acid 1 MG tablet ?Commonly known as: FOLVITE ?Take 1 mg by mouth every morning. ?  ?hydrALAZINE 50 MG tablet ?Commonly known as: APRESOLINE ?Take 0.5 tablets (25 mg total) by mouth every 8 (eight) hours. ?What changed: how much to take ?  ?hydrOXYzine 25 MG  tablet ?Commonly known as: ATARAX ?Take 25 mg by mouth every 12 (twelve) hours as needed for itching. ?  ?insulin aspart 100 UNIT/ML injection ?Commonly known as: novoLOG ?Inject 1-3 Units into the skin See admin instructions. Inject per sliding scale  ?If BS 151-250= 1 unit ?251- 350= 2 units ?351-400= 3 units ?401-500= 3 units ?Give subcutaneous at bedtime ?  ?levETIRAcetam 500 MG tablet ?Commonly known as: KEPPRA ?Take 500 mg by mouth at bedtime. ?  ?lidocaine-prilocaine cream ?Commonly known as: EMLA ?Apply 1 application. topically as directed. Apply to left arm fistula every tues, thurs, and sat for hemodialysis ?  ?MULTIVITAMIN ADULT PO ?Take 1 tablet by mouth daily. ?  ?oxyCODONE 5 MG immediate release tablet ?Commonly known as: Oxy IR/ROXICODONE ?Take 1 tablet (5 mg total) by mouth every 6 (six) hours as needed for severe pain. ?  ?pantoprazole 40 MG tablet ?Commonly known as: Protonix ?Take 1 tablet (40 mg total) by mouth daily. ?  ?traZODone 150 MG tablet ?Commonly known as: DESYREL ?Take 150 mg by mouth at bedtime. ?  ?vitamin C 500 MG tablet ?Commonly known as: ASCORBIC ACID ?Take 500 mg by mouth 2 (two) times daily. ?  ?zinc sulfate 220 (50 Zn) MG capsule ?Take 220 mg by mouth daily. ?  ? ?  ? ? ?  Discharge Exam: ?Filed Weights  ? 06/20/21 0414 06/20/21 1100 06/20/21 1515  ?Weight: 50.5 kg 50.5 kg 47.5 kg  ? ?HEENT:  [place]/AT, No thrush, no icterus ?CV:  RRR, no rub, no S3, no S4 ?Lung:  CTA, no wheeze, no rhonchi ?Abd:  soft/+BS, NT ?Ext:  No edema, no lymphangitis, no synovitis, no rash ? ? ?Condition at discharge: stable ? ?The results of significant diagnostics from this hospitalization (including imaging, microbiology, ancillary and laboratory) are listed below for reference.  ? ?Imaging Studies: ?CT HEAD WO CONTRAST (5MM) ? ?Result Date: 06/21/2021 ?CLINICAL DATA:  Fall.  Blunt head trauma. EXAM: CT HEAD WITHOUT CONTRAST TECHNIQUE: Contiguous axial images were obtained from the base of the skull through  the vertex without intravenous contrast. RADIATION DOSE REDUCTION: This exam was performed according to the departmental dose-optimization program which includes automated exposure control, adjustment of the mA an

## 2021-06-23 ENCOUNTER — Encounter: Payer: Self-pay | Admitting: Orthopedic Surgery

## 2021-06-23 ENCOUNTER — Ambulatory Visit (INDEPENDENT_AMBULATORY_CARE_PROVIDER_SITE_OTHER): Payer: Medicare Other | Admitting: Orthopedic Surgery

## 2021-06-23 VITALS — BP 181/79 | HR 74 | Ht 59.0 in

## 2021-06-23 DIAGNOSIS — S7001XA Contusion of right hip, initial encounter: Secondary | ICD-10-CM

## 2021-06-23 NOTE — Progress Notes (Signed)
Chief Complaint  ?Patient presents with  ? Hip Pain  ?  Right DOI 06/11/21- fall  ? ? ?HPI: 66 year old female had a bipolar replacement done elsewhere has frequent hypoglycemic episodes has coronary artery disease presents after fall a to 12 days ago with negative work-up including x-rays and CT scan.  Complains of right hip pain ? ?Apparently she has been slow to regain her previous fall gait ? ?Past Medical History:  ?Diagnosis Date  ? Alcohol-induced pancreatitis   ? CAD (coronary artery disease) 09/17/2020  ? Chronic diarrhea   ? Depression   ? Diabetes mellitus   ? fasting blood sugar 110-120s  ? Dialysis patient [facility])   ? Diastolic CHF ([place])   ? DKA (diabetic ketoacidoses)   ? Gastroparesis   ? GERD (gastroesophageal reflux disease)   ? Heart murmur   ? History of kidney stones   ? Hyperlipidemia   ? Hypertension   ? Hypokalemia   ? Muscle spasm   ? Neuropathic pain   ? Neuropathy   ? Hx: of  ? Pyelonephritis   ? Seizures ([place])   ? Vitamin B12 deficiency   ? Vitamin D deficiency   ? ? ?BP (!) 181/79   Pulse 74   Ht 4\' 11"  (1.499 m)   BMI 21.15 kg/m?  ? ? ?General appearance: Well-developed well-nourished no gross deformities ? ?Cardiovascular normal pulse and perfusion normal color without edema ? ?Neurologically no sensation loss or deficits or pathologic reflexes ? ?Psychological: Awake alert  ? ?Skin no lacerations or ulcerations no nodularity no palpable masses, no erythema or nodularity ? ?Musculoskeletal: Weak hip flexion compared right to left normal passive motion in both hips leg lengths are equal mild tenderness right greater trochanter ? ? ?Interpretation of images right hip ?Imaging plain films right hip cemented prosthesis right hip no fracture ? ?CT scan also shows no fracture ? ?A/P ? ?Encounter Diagnosis  ?Name Primary?  ? Contusion of right hip, initial encounter Yes  ? ?No follow-up necessary ? ?She can be full weightbearing advance as tolerated no restrictions ? ?Contusion right hip ?

## 2021-06-24 LAB — CULTURE, BLOOD (ROUTINE X 2)
Culture: NO GROWTH
Special Requests: ADEQUATE

## 2021-07-08 ENCOUNTER — Encounter (HOSPITAL_COMMUNITY): Payer: Self-pay | Admitting: Emergency Medicine

## 2021-07-08 ENCOUNTER — Other Ambulatory Visit: Payer: Self-pay

## 2021-07-08 ENCOUNTER — Emergency Department (HOSPITAL_COMMUNITY)
Admission: EM | Admit: 2021-07-08 | Discharge: 2021-07-08 | Disposition: A | Discharge status: Skilled Nursing Facility | Payer: Medicare Other | Attending: Emergency Medicine | Admitting: Emergency Medicine

## 2021-07-08 DIAGNOSIS — Z794 Long term (current) use of insulin: Secondary | ICD-10-CM | POA: Insufficient documentation

## 2021-07-08 DIAGNOSIS — N2889 Other specified disorders of kidney and ureter: Secondary | ICD-10-CM | POA: Insufficient documentation

## 2021-07-08 DIAGNOSIS — R569 Unspecified convulsions: Secondary | ICD-10-CM

## 2021-07-08 DIAGNOSIS — G40909 Epilepsy, unspecified, not intractable, without status epilepticus: Secondary | ICD-10-CM | POA: Diagnosis not present

## 2021-07-08 DIAGNOSIS — R739 Hyperglycemia, unspecified: Secondary | ICD-10-CM | POA: Diagnosis not present

## 2021-07-08 LAB — CBC WITH DIFFERENTIAL/PLATELET
Abs Immature Granulocytes: 0.02 10*3/uL (ref 0.00–0.07)
Basophils Absolute: 0 10*3/uL (ref 0.0–0.1)
Basophils Relative: 0 %
Eosinophils Absolute: 0.1 10*3/uL (ref 0.0–0.5)
Eosinophils Relative: 3 %
HCT: 38.4 % (ref 36.0–46.0)
Hemoglobin: 12 g/dL (ref 12.0–15.0)
Immature Granulocytes: 0 %
Lymphocytes Relative: 23 %
Lymphs Abs: 1.1 10*3/uL (ref 0.7–4.0)
MCH: 31.4 pg (ref 26.0–34.0)
MCHC: 31.3 g/dL (ref 30.0–36.0)
MCV: 100.5 fL — ABNORMAL HIGH (ref 80.0–100.0)
Monocytes Absolute: 0.4 10*3/uL (ref 0.1–1.0)
Monocytes Relative: 8 %
Neutro Abs: 3.2 10*3/uL (ref 1.7–7.7)
Neutrophils Relative %: 66 %
Platelets: 120 10*3/uL — ABNORMAL LOW (ref 150–400)
RBC: 3.82 MIL/uL — ABNORMAL LOW (ref 3.87–5.11)
RDW: 13.3 % (ref 11.5–15.5)
WBC: 4.8 10*3/uL (ref 4.0–10.5)
nRBC: 0 % (ref 0.0–0.2)

## 2021-07-08 LAB — BASIC METABOLIC PANEL
Anion gap: 12 (ref 5–15)
BUN: 26 mg/dL — ABNORMAL HIGH (ref 8–23)
CO2: 25 mmol/L (ref 22–32)
Calcium: 9.3 mg/dL (ref 8.9–10.3)
Chloride: 101 mmol/L (ref 98–111)
Creatinine, Ser: 5.44 mg/dL — ABNORMAL HIGH (ref 0.44–1.00)
GFR, Estimated: 8 mL/min — ABNORMAL LOW (ref >60)
Glucose, Bld: 200 mg/dL — ABNORMAL HIGH (ref 70–99)
Potassium: 3.7 mmol/L (ref 3.5–5.1)
Sodium: 138 mmol/L (ref 135–145)

## 2021-07-08 MED ORDER — SODIUM CHLORIDE 0.9 % IV BOLUS: 500.0000 mL | Freq: Once | INTRAVENOUS | Status: DC

## 2021-07-08 MED ORDER — LEVETIRACETAM 500 MG PO TABS
500.0000 mg | ORAL_TABLET | Freq: Two times a day (BID) | ORAL | 0 refills | Status: DC
Start: 2021-07-08 — End: 2021-10-19

## 2021-07-08 MED ORDER — SODIUM CHLORIDE 0.9 % IV SOLN: INTRAVENOUS | Status: DC

## 2021-07-08 MED ORDER — LEVETIRACETAM IN NACL 1000 MG/100ML IV SOLN: 1000.0000 mg | Freq: Once | INTRAVENOUS | Status: DC

## 2021-07-08 MED ORDER — LEVETIRACETAM 500 MG PO TABS
500.0000 mg | ORAL_TABLET | Freq: Once | ORAL | Status: AC
  Administered 2021-07-08: 500 mg via ORAL
  Filled 2021-07-08: qty 1

## 2021-07-08 NOTE — Discharge Instructions (Signed)
Take Keppra twice a day for 3 days then resume once a day in the evening as usual.  Follow-up with your doctor as needed for problems

## 2021-07-08 NOTE — ED Notes (Signed)
Notified [facility] of patient needing transportation back to [facility].

## 2021-07-08 NOTE — ED Triage Notes (Signed)
Pt arrived via [facility] from [facility] c/o seizure activity. Hx seizures, on dialysis, renal failure Per EMS, [place] called EMS for seizure; However, could not tell EMS much about seizure activity

## 2021-07-08 NOTE — ED Provider Notes (Signed)
[facility] EMERGENCY DEPARTMENT Provider Note   CSN: [id number] Arrival date & time: 07/08/21  1727     History {Add pertinent medical, surgical, social history, OB history to HPI:1} No chief complaint on file.   Erica [name] is a 66 y.o. female.  HPI Patient resides at a nursing care facility is completely unable to give any history.  Report to EMS that the patient has a seizure disorder and had a seizure today.  No additional information was available by the transporting unit or paperwork with the patient    Home Medications Prior to Admission medications   Medication Sig Start Date End Date Taking? Authorizing Provider  acetaminophen (TYLENOL) 325 MG tablet Take 650 mg by mouth every 4 (four) hours as needed for moderate pain.    [provider]  albuterol (VENTOLIN HFA) 108 (90 Base) MCG/ACT inhaler Inhale 2 puffs into the lungs every 4 (four) hours as needed for wheezing or shortness of breath.     [provider]  Amino Acids-Protein Hydrolys (FEEDING SUPPLEMENT, PRO-STAT 64,) LIQD Take 30 mLs by mouth in the morning.    [provider]  Biotin 10 MG TABS Take 10 mg by mouth daily.    [provider]  calcitRIOL (ROCALTROL) 0.25 MCG capsule Take 1 capsule (0.25 mcg total) by mouth Every Tuesday,Thursday,and Saturday with dialysis. 12/18/17   Love, Ivan Anchors, PA-C  carvedilol (COREG) 12.5 MG tablet Take 1 tablet (12.5 mg total) by mouth 2 (two) times daily. 09/24/20   Johnson, Clanford L, MD  ferric citrate (AURYXIA) 1 GM 210 MG(Fe) tablet Take 2 tablets (420 mg total) by mouth 3 (three) times daily with meals. 11/15/18   Kinnie Feil, MD  folic acid (FOLVITE) 1 MG tablet Take 1 mg by mouth every morning.    [provider]  hydrALAZINE (APRESOLINE) 50 MG tablet Take 0.5 tablets (25 mg total) by mouth every 8 (eight) hours. 06/21/21   Orson Eva, MD  hydrOXYzine (ATARAX/VISTARIL) 25 MG tablet Take 25 mg by mouth every 12 (twelve)  hours as needed for itching. 12/27/20   [provider]  insulin aspart (NOVOLOG) 100 UNIT/ML injection Inject 1-3 Units into the skin See admin instructions. Inject per sliding scale  If BS 151-250= 1 unit 251- 350= 2 units 351-400= 3 units 401-500= 3 units Give subcutaneous at bedtime    [provider]  levETIRAcetam (KEPPRA) 500 MG tablet Take 500 mg by mouth at bedtime.    [provider]  lidocaine-prilocaine (EMLA) cream Apply 1 application. topically as directed. Apply to left arm fistula every tues, thurs, and sat for hemodialysis    [provider]  Multiple Vitamin (MULTIVITAMIN ADULT PO) Take 1 tablet by mouth daily.    [provider]  oxyCODONE (OXY IR/ROXICODONE) 5 MG immediate release tablet Take 1 tablet (5 mg total) by mouth every 6 (six) hours as needed for severe pain. 06/21/21   Orson Eva, MD  pantoprazole (PROTONIX) 40 MG tablet Take 1 tablet (40 mg total) by mouth daily. 11/19/19 06/19/21  Oswald Hillock, MD  traZODone (DESYREL) 150 MG tablet Take 150 mg by mouth at bedtime.    [provider]  vitamin C (ASCORBIC ACID) 500 MG tablet Take 500 mg by mouth 2 (two) times daily.    [provider]  zinc sulfate 220 (50 Zn) MG capsule Take 220 mg by mouth daily.    [provider]      Allergies  Aspirin and Metformin and related    Review of Systems   Review of Systems  Physical Exam Updated Vital Signs BP (!) 176/65 (BP Location: Right Arm)   Pulse 70   Temp (!) 97.4 F (36.3 C) (Oral)   Resp 17   Ht 4\' 11"  (1.499 m)   Wt 47.5 kg   SpO2 100%   BMI 21.15 kg/m  Physical Exam Vitals and nursing note reviewed.  Constitutional:      General: She is not in acute distress.    Appearance: She is well-developed. She is not ill-appearing, toxic-appearing or diaphoretic.     Comments: Appears under nourished  HENT:     Head: Normocephalic and atraumatic.     Right Ear: External ear normal.      Left Ear: External ear normal.  Eyes:     Conjunctiva/sclera: Conjunctivae normal.     Pupils: Pupils are equal, round, and reactive to light.  Neck:     Trachea: Phonation normal.  Cardiovascular:     Rate and Rhythm: Normal rate and regular rhythm.     Heart sounds: Normal heart sounds.  Pulmonary:     Effort: Pulmonary effort is normal.     Breath sounds: Normal breath sounds.  Abdominal:     General: There is no distension.     Palpations: Abdomen is soft.     Tenderness: There is no abdominal tenderness.  Musculoskeletal:        General: Normal range of motion.     Cervical back: Normal range of motion and neck supple.     Comments: Moves all extremities on command and able to elevate them off the stretcher easily.  Skin:    General: Skin is warm and dry.  Neurological:     Mental Status: She is alert and oriented to person, place, and time.     Cranial Nerves: No cranial nerve deficit.     Sensory: No sensory deficit.     Motor: No abnormal muscle tone.     Coordination: Coordination normal.  Psychiatric:        Mood and Affect: Mood normal.        Behavior: Behavior normal.    ED Results / Procedures / Treatments   Labs (all labs ordered are listed, but only abnormal results are displayed) Labs Reviewed - No data to display  EKG None  Radiology No results found.  Procedures Procedures  {Document cardiac monitor, telemetry assessment procedure when appropriate:1}  Medications Ordered in ED Medications - No data to display  ED Course/ Medical Decision Making/ A&P                           Medical Decision Making Patient presenting for evaluation of seizure.  She needs evaluation for problems that can lead to seizure.  She will be given a dose of Keppra, during this observation and assessment.   ***  {Document critical care time when appropriate:1} {Document review of labs and clinical decision tools ie heart score, Chads2Vasc2 etc:1}  {Document your  independent review of radiology images, and any outside records:1} {Document your discussion with family members, caretakers, and with consultants:1} {Document social determinants of health affecting pt's care:1} {Document your decision making why or why not admission, treatments were needed:1} Final Clinical Impression(s) / ED Diagnoses Final diagnoses:  None    Rx / DC Orders ED Discharge Orders     None

## 2021-08-21 ENCOUNTER — Emergency Department (HOSPITAL_COMMUNITY)
Admission: EM | Admit: 2021-08-21 | Discharge: 2021-08-22 | Disposition: A | Discharge status: Skilled Nursing Facility | Payer: Medicare Other | Attending: Emergency Medicine | Admitting: Emergency Medicine

## 2021-08-21 ENCOUNTER — Encounter (HOSPITAL_COMMUNITY): Payer: Self-pay | Admitting: Emergency Medicine

## 2021-08-21 ENCOUNTER — Emergency Department (HOSPITAL_COMMUNITY): Payer: Medicare Other

## 2021-08-21 ENCOUNTER — Other Ambulatory Visit: Payer: Self-pay

## 2021-08-21 DIAGNOSIS — R519 Headache, unspecified: Secondary | ICD-10-CM | POA: Insufficient documentation

## 2021-08-21 DIAGNOSIS — I2510 Atherosclerotic heart disease of native coronary artery without angina pectoris: Secondary | ICD-10-CM | POA: Insufficient documentation

## 2021-08-21 DIAGNOSIS — R4182 Altered mental status, unspecified: Secondary | ICD-10-CM | POA: Diagnosis present

## 2021-08-21 DIAGNOSIS — I110 Hypertensive heart disease with heart failure: Secondary | ICD-10-CM | POA: Insufficient documentation

## 2021-08-21 DIAGNOSIS — E119 Type 2 diabetes mellitus without complications: Secondary | ICD-10-CM | POA: Diagnosis not present

## 2021-08-21 DIAGNOSIS — I509 Heart failure, unspecified: Secondary | ICD-10-CM | POA: Diagnosis not present

## 2021-08-21 DIAGNOSIS — Z79899 Other long term (current) drug therapy: Secondary | ICD-10-CM | POA: Insufficient documentation

## 2021-08-21 DIAGNOSIS — Z794 Long term (current) use of insulin: Secondary | ICD-10-CM | POA: Insufficient documentation

## 2021-08-21 DIAGNOSIS — R5383 Other fatigue: Secondary | ICD-10-CM | POA: Diagnosis not present

## 2021-08-21 DIAGNOSIS — E875 Hyperkalemia: Secondary | ICD-10-CM | POA: Insufficient documentation

## 2021-08-21 LAB — CBC
HCT: 36.9 % (ref 36.0–46.0)
Hemoglobin: 11.5 g/dL — ABNORMAL LOW (ref 12.0–15.0)
MCH: 31.2 pg (ref 26.0–34.0)
MCHC: 31.2 g/dL (ref 30.0–36.0)
MCV: 100 fL (ref 80.0–100.0)
Platelets: 112 10*3/uL — ABNORMAL LOW (ref 150–400)
RBC: 3.69 MIL/uL — ABNORMAL LOW (ref 3.87–5.11)
RDW: 14.3 % (ref 11.5–15.5)
WBC: 5.4 10*3/uL (ref 4.0–10.5)
nRBC: 0 % (ref 0.0–0.2)

## 2021-08-21 LAB — I-STAT CHEM 8, ED
BUN: 63 mg/dL — ABNORMAL HIGH (ref 8–23)
Calcium, Ion: 1.02 mmol/L — ABNORMAL LOW (ref 1.15–1.40)
Chloride: 104 mmol/L (ref 98–111)
Creatinine, Ser: 7.2 mg/dL — ABNORMAL HIGH (ref 0.44–1.00)
Glucose, Bld: 185 mg/dL — ABNORMAL HIGH (ref 70–99)
HCT: 49 % — ABNORMAL HIGH (ref 36.0–46.0)
Hemoglobin: 16.7 g/dL — ABNORMAL HIGH (ref 12.0–15.0)
Potassium: 6.7 mmol/L (ref 3.5–5.1)
Sodium: 126 mmol/L — ABNORMAL LOW (ref 135–145)
TCO2: 20 mmol/L — ABNORMAL LOW (ref 22–32)

## 2021-08-21 LAB — RENAL FUNCTION PANEL
Albumin: 3.1 g/dL — ABNORMAL LOW (ref 3.5–5.0)
Anion gap: 9 (ref 5–15)
BUN: 57 mg/dL — ABNORMAL HIGH (ref 8–23)
CO2: 24 mmol/L (ref 22–32)
Calcium: 9.1 mg/dL (ref 8.9–10.3)
Chloride: 97 mmol/L — ABNORMAL LOW (ref 98–111)
Creatinine, Ser: 6.65 mg/dL — ABNORMAL HIGH (ref 0.44–1.00)
GFR, Estimated: 6 mL/min — ABNORMAL LOW (ref >60)
Glucose, Bld: 178 mg/dL — ABNORMAL HIGH (ref 70–99)
Phosphorus: 2.5 mg/dL (ref 2.5–4.6)
Potassium: 6.7 mmol/L (ref 3.5–5.1)
Sodium: 130 mmol/L — ABNORMAL LOW (ref 135–145)

## 2021-08-21 LAB — CBG MONITORING, ED: Glucose-Capillary: 183 mg/dL — ABNORMAL HIGH (ref 70–99)

## 2021-08-21 MED ORDER — SODIUM ZIRCONIUM CYCLOSILICATE 5 G PO PACK
10.0000 g | PACK | Freq: Once | ORAL | Status: AC
  Administered 2021-08-21: 10 g via ORAL
  Filled 2021-08-21: qty 2

## 2021-08-21 MED ORDER — PENTAFLUOROPROP-TETRAFLUOROETH EX AERO: 1.0000 | INHALATION_SPRAY | CUTANEOUS | Status: DC | PRN

## 2021-08-21 MED ORDER — CALCIUM GLUCONATE 10 % IV SOLN
1.0000 g | Freq: Once | INTRAVENOUS | Status: AC
  Administered 2021-08-21: 1 g via INTRAVENOUS
  Filled 2021-08-21: qty 10

## 2021-08-21 MED ORDER — LIDOCAINE HCL (PF) 1 % IJ SOLN: 5.0000 mL | INTRAMUSCULAR | Status: DC | PRN

## 2021-08-21 MED ORDER — ALTEPLASE 2 MG IJ SOLR
2.0000 mg | Freq: Once | INTRAMUSCULAR | Status: DC | PRN
  Filled 2021-08-21: qty 2

## 2021-08-21 MED ORDER — SODIUM BICARBONATE 8.4 % IV SOLN
INTRAVENOUS | Status: AC
  Filled 2021-08-21: qty 50

## 2021-08-21 MED ORDER — ANTICOAGULANT SODIUM CITRATE 4% (200MG/5ML) IV SOLN
5.0000 mL | Status: DC | PRN
  Filled 2021-08-21: qty 5

## 2021-08-21 MED ORDER — SODIUM BICARBONATE 8.4 % IV SOLN
50.0000 meq | Freq: Once | INTRAVENOUS | Status: AC
  Administered 2021-08-21: 50 meq via INTRAVENOUS

## 2021-08-21 MED ORDER — LIDOCAINE-PRILOCAINE 2.5-2.5 % EX CREA: 1.0000 | TOPICAL_CREAM | CUTANEOUS | Status: DC | PRN

## 2021-08-21 MED ORDER — HEPARIN SODIUM (PORCINE) 1000 UNIT/ML DIALYSIS: 1000.0000 [IU] | INTRAMUSCULAR | Status: DC | PRN

## 2021-08-21 MED ORDER — KETOROLAC TROMETHAMINE 30 MG/ML IJ SOLN
30.0000 mg | Freq: Once | INTRAMUSCULAR | Status: AC
  Administered 2021-08-21: 30 mg via INTRAMUSCULAR
  Filled 2021-08-21: qty 1

## 2021-08-21 MED ORDER — CHLORHEXIDINE GLUCONATE CLOTH 2 % EX PADS: 6.0000 | MEDICATED_PAD | Freq: Every day | CUTANEOUS | Status: DC

## 2021-08-21 NOTE — Procedures (Signed)
   HEMODIALYSIS TREATMENT NOTE:   3 hour heparin-free session completed using left upper arm loop AVG (15g/retrograde flow). Difficult cannulation; had bleeding from old cannulation sites.  Pressure dressing had been applied prior to arrival to [facility].  Max Qb 300 d/t high venous pressures.  Orders modified after d/w Dr. Hollie Salk.  2 hours on 1K bath, then 2K.  Goal met: 1.5 liters removed.  All blood was returned and hemostasis was achieved in 20 minutes.  Pt was transferred back to ED to await disposition.  Report given to [place], [place].  Rockwell Alexandria, RN

## 2021-08-21 NOTE — ED Provider Notes (Signed)
[facility] Provider Note   CSN: [id number] Arrival date & time: 08/21/21  1229     History  Chief Complaint  Patient presents with   Altered Mental Status    Erica [name] is a 66 y.o. female.  Patient was over at dialysis today and they stated she had a headache and was acting a little bit more sleepy than usual.  So she was sent over to the emergency department for evaluation.  Patient has a history of diabetes seizures EtOH hypertension heart failure coronary artery disease and kidney failure.  The history is provided by the patient and medical records. No language interpreter was used.  Altered Mental Status Presenting symptoms: no disorientation   Severity:  Mild Most recent episode:  Today Episode history:  Continuous Timing:  Intermittent Progression:  Improving Chronicity:  New Context: not alcohol use   Associated symptoms: headaches   Associated symptoms: no abdominal pain, no hallucinations, no rash and no seizures        Home Medications Prior to Admission medications   Medication Sig Start Date End Date Taking? Authorizing Provider  acetaminophen (TYLENOL) 325 MG tablet Take 650 mg by mouth every 4 (four) hours as needed for moderate pain.    [provider]  albuterol (VENTOLIN HFA) 108 (90 Base) MCG/ACT inhaler Inhale 2 puffs into the lungs every 4 (four) hours as needed for wheezing or shortness of breath.     [provider]  Amino Acids-Protein Hydrolys (FEEDING SUPPLEMENT, PRO-STAT 64,) LIQD Take 30 mLs by mouth in the morning.    [provider]  Biotin 10 MG TABS Take 10 mg by mouth daily.    [provider]  calcitRIOL (ROCALTROL) 0.25 MCG capsule Take 1 capsule (0.25 mcg total) by mouth Every Tuesday,Thursday,and Saturday with dialysis. 12/18/17   Love, Ivan Anchors, PA-C  carvedilol (COREG) 12.5 MG tablet Take 1 tablet (12.5 mg total) by mouth 2 (two) times daily. 09/24/20   Johnson, Clanford L,  MD  ferric citrate (AURYXIA) 1 GM 210 MG(Fe) tablet Take 2 tablets (420 mg total) by mouth 3 (three) times daily with meals. 11/15/18   Kinnie Feil, MD  folic acid (FOLVITE) 1 MG tablet Take 1 mg by mouth every morning.    [provider]  hydrALAZINE (APRESOLINE) 50 MG tablet Take 0.5 tablets (25 mg total) by mouth every 8 (eight) hours. 06/21/21   Orson Eva, MD  hydrOXYzine (ATARAX/VISTARIL) 25 MG tablet Take 25 mg by mouth every 12 (twelve) hours as needed for itching. 12/27/20   [provider]  insulin aspart (NOVOLOG) 100 UNIT/ML injection Inject 1-3 Units into the skin See admin instructions. Inject per sliding scale  If BS 151-250= 1 unit 251- 350= 2 units 351-400= 3 units 401-500= 3 units Give subcutaneous at bedtime    [provider]  levETIRAcetam (KEPPRA) 500 MG tablet Take 500 mg by mouth at bedtime.    [provider]  levETIRAcetam (KEPPRA) 500 MG tablet Take 1 tablet (500 mg total) by mouth 2 (two) times daily. 07/08/21   Daleen Bo, MD  lidocaine-prilocaine (EMLA) cream Apply 1 application. topically as directed. Apply to left arm fistula every tues, thurs, and sat for hemodialysis    [provider]  Multiple Vitamin (MULTIVITAMIN ADULT PO) Take 1 tablet by mouth daily.    [provider]  oxyCODONE (OXY IR/ROXICODONE) 5 MG immediate release tablet Take 1 tablet (5 mg total) by mouth every 6 (six) hours  as needed for severe pain. 06/21/21   Orson Eva, MD  pantoprazole (PROTONIX) 40 MG tablet Take 1 tablet (40 mg total) by mouth daily. 11/19/19 06/19/21  Oswald Hillock, MD  traZODone (DESYREL) 150 MG tablet Take 150 mg by mouth at bedtime.    [provider]  vitamin C (ASCORBIC ACID) 500 MG tablet Take 500 mg by mouth 2 (two) times daily.    [provider]  zinc sulfate 220 (50 Zn) MG capsule Take 220 mg by mouth daily.    [provider]      Allergies    Aspirin and Metformin and related     Review of Systems   Review of Systems  Constitutional:  Negative for appetite change and fatigue.  HENT:  Negative for congestion, ear discharge and sinus pressure.   Eyes:  Negative for discharge.  Respiratory:  Negative for cough.   Cardiovascular:  Negative for chest pain.  Gastrointestinal:  Negative for abdominal pain and diarrhea.  Genitourinary:  Negative for frequency and hematuria.  Musculoskeletal:  Negative for back pain.  Skin:  Negative for rash.  Neurological:  Positive for headaches. Negative for seizures.  Psychiatric/Behavioral:  Negative for hallucinations.     Physical Exam Updated Vital Signs BP (!) 189/78   Pulse 67   Temp 97.6 F (36.4 C) (Oral)   Resp 11   Ht 4\' 11"  (1.499 m)   Wt 47.5 kg   SpO2 99%   BMI 21.15 kg/m  Physical Exam Vitals and nursing note reviewed.  Constitutional:      Appearance: She is well-developed.     Comments: Mildly lethargic  HENT:     Head: Normocephalic.     Nose: Nose normal.  Eyes:     General: No scleral icterus.    Conjunctiva/sclera: Conjunctivae normal.  Neck:     Thyroid: No thyromegaly.  Cardiovascular:     Rate and Rhythm: Normal rate and regular rhythm.     Heart sounds: No murmur heard.    No friction rub. No gallop.  Pulmonary:     Breath sounds: No stridor. No wheezing or rales.  Chest:     Chest wall: No tenderness.  Abdominal:     General: There is no distension.     Tenderness: There is no abdominal tenderness. There is no rebound.  Musculoskeletal:        General: Normal range of motion.     Cervical back: Neck supple.  Lymphadenopathy:     Cervical: No cervical adenopathy.  Skin:    Findings: No erythema or rash.  Neurological:     Mental Status: She is oriented to person, place, and time.     Motor: No abnormal muscle tone.     Coordination: Coordination normal.  Psychiatric:        Behavior: Behavior normal.     ED Results / Procedures / Treatments   Labs (all labs ordered  are listed, but only abnormal results are displayed) Labs Reviewed  CBG MONITORING, ED - Abnormal; Notable for the following components:      Result Value   Glucose-Capillary 183 (*)    All other components within normal limits  I-STAT CHEM 8, ED - Abnormal; Notable for the following components:   Sodium 126 (*)    Potassium 6.7 (*)    BUN 63 (*)    Creatinine, Ser 7.20 (*)    Glucose, Bld 185 (*)    Calcium, Ion 1.02 (*)  TCO2 20 (*)    Hemoglobin 16.7 (*)    HCT 49.0 (*)    All other components within normal limits  RENAL FUNCTION PANEL  CBC  HEPATITIS B SURFACE ANTIGEN  HEPATITIS B SURFACE ANTIBODY,QUALITATIVE  HEPATITIS B SURFACE ANTIBODY, QUANTITATIVE  HEPATITIS B CORE ANTIBODY, TOTAL  HEPATITIS C ANTIBODY    EKG None  Radiology CT Head Wo Contrast  Result Date: 08/21/2021 CLINICAL DATA:  Hypertensive with headaches today during dialysis. EXAM: CT HEAD WITHOUT CONTRAST TECHNIQUE: Contiguous axial images were obtained from the base of the skull through the vertex without intravenous contrast. RADIATION DOSE REDUCTION: This exam was performed according to the departmental dose-optimization program which includes automated exposure control, adjustment of the mA and/or kV according to patient size and/or use of iterative reconstruction technique. COMPARISON:  07/11/2021 FINDINGS: Brain: Stable age advanced cerebral atrophy, ventriculomegaly and periventricular white matter disease. No extra-axial fluid collections are identified. No CT findings for acute hemispheric infarction or intracranial hemorrhage. No mass lesions. The brainstem and cerebellum are normal. Vascular: Stable vascular calcifications. No aneurysm or hyperdense vessels. Skull: No skull fracture or bone lesions. Stable hyperostosis frontalis interna. Sinuses/Orbits: The paranasal sinuses and mastoid air cells are clear. The globes are intact. Other: No scalp lesions or scalp hematoma. IMPRESSION: 1. Stable age  advanced cerebral atrophy, ventriculomegaly and periventricular white matter disease. 2. No acute intracranial findings or mass lesions. Electronically Signed   By: Marijo Sanes M.D.   On: 08/21/2021 13:34    Procedures Procedures    Medications Ordered in ED Medications  sodium bicarbonate injection 50 mEq (has no administration in time range)  calcium gluconate inj 10% (1 g) URGENT USE ONLY! (has no administration in time range)  Chlorhexidine Gluconate Cloth 2 % PADS 6 each (has no administration in time range)  pentafluoroprop-tetrafluoroeth (GEBAUERS) aerosol 1 Application (has no administration in time range)  lidocaine (PF) (XYLOCAINE) 1 % injection 5 mL (has no administration in time range)  lidocaine-prilocaine (EMLA) cream 1 Application (has no administration in time range)  heparin injection 1,000 Units (has no administration in time range)  anticoagulant sodium citrate solution 5 mL (has no administration in time range)  alteplase (CATHFLO ACTIVASE) injection 2 mg (has no administration in time range)  ketorolac (TORADOL) 30 MG/ML injection 30 mg (30 mg Intramuscular Given 08/21/21 1320)  sodium zirconium cyclosilicate (LOKELMA) packet 10 g (10 g Oral Given 08/21/21 1546)    ED Course/ Medical Decision Making/ A&P  CRITICAL CARE Performed by: Milton Ferguson Total critical care time: 40 minutes Critical care time was exclusive of separately billable procedures and treating other patients. Critical care was necessary to treat or prevent imminent or life-threatening deterioration. Critical care was time spent personally by me on the following activities: development of treatment plan with patient and/or surrogate as well as nursing, discussions with consultants, evaluation of patient's response to treatment, examination of patient, obtaining history from patient or surrogate, ordering and performing treatments and interventions, ordering and review of laboratory studies, ordering and  review of radiographic studies, pulse oximetry and re-evaluation of patient's condition.    Patient was given Toradol for headache and she felt much better.  CT head was negative.  Chemistry shows a patient to have an elevated potassium of 6.7 but her EKG shows no T wave changes.  I spoke with Dr. Hollie Salk nephrology and she recommended treating the patient with [facility], calcium, bicarb and she will arrange for the patient to get a short dialysis procedure today.  After the dialysis she can go home                         Medical Decision Making Amount and/or Complexity of Data Reviewed Radiology: ordered. ECG/medicine tests: ordered.  Risk Prescription drug management.  This patient presents to the ED for concern of headache, this involves an extensive number of treatment options, and is a complaint that carries with it a high risk of complications and morbidity.  The differential diagnosis includes bad headache, cerebral hemorrhage   Co morbidities that complicate the patient evaluation  Kidney failure   Additional history obtained:  Additional history obtained from patient External records from outside source obtained and reviewed including hospital records   Lab Tests:  I Ordered, and personally interpreted labs.  The pertinent results include: Labs show potassium 6.7   Imaging Studies ordered:  I ordered imaging studies including CT head I independently visualized and interpreted imaging which showed negative I agree with the radiologist interpretation   Cardiac Monitoring: / EKG:  The patient was maintained on a cardiac monitor.  I personally viewed and interpreted the cardiac monitored which showed an underlying rhythm of: Normal sinus rhythm   Consultations Obtained:  I requested consultation with the nephrologist,  and discussed lab and imaging findings as well as pertinent plan - they recommend: Dialysis   Problem List / ED Course / Critical interventions /  Medication management  Hyperkalemia and renal failure I ordered medication including Lokelma, calcium, bicarb Reevaluation of the patient after these medicines showed that the patient improved I have reviewed the patients home medicines and have made adjustments as needed   Social Determinants of Health:  Dialysis patient   Test / Admission - Considered:  No other test considered  Patient with a headache and hyperkalemia.  Headache improved with Toradol and she will get dialysis for the elevated potassium        Final Clinical Impression(s) / ED Diagnoses Final diagnoses:  None    Rx / DC Orders ED Discharge Orders     None         Milton Ferguson, MD 08/22/21 1811

## 2021-08-21 NOTE — ED Triage Notes (Signed)
Pt BIB [facility] from [facility] for AMS. Pt is was found to be hypertensive with a headache. CBG was 222.  Pt is alert to everything except time.  The Dialysis Nurse said the pt normally has a Tuesday, Saturday, Thursday schedule.  The pt missed dialysis on Tuesday and was found to be 5 kg over her last weight. It is unknown if she completed her treatment today. She was noted to be weak and not as communicative today by [facility].

## 2021-08-21 NOTE — ED Notes (Signed)
Pt to dialysis.

## 2021-08-21 NOTE — Discharge Instructions (Signed)
Follow-up for your next dialysis treatment as usual

## 2021-08-22 LAB — HEPATITIS B SURFACE ANTIGEN: Hepatitis B Surface Ag: NONREACTIVE

## 2021-08-22 LAB — HEPATITIS B SURFACE ANTIBODY,QUALITATIVE: Hep B S Ab: REACTIVE — AB

## 2021-08-22 LAB — HEPATITIS C ANTIBODY: HCV Ab: NONREACTIVE

## 2021-08-22 LAB — HEPATITIS B CORE ANTIBODY, TOTAL: Hep B Core Total Ab: NONREACTIVE

## 2021-08-22 LAB — HEPATITIS B SURFACE ANTIBODY, QUANTITATIVE: Hep B S AB Quant (Post): 173.3 m[IU]/mL (ref >9.9)

## 2021-08-24 ENCOUNTER — Emergency Department (HOSPITAL_COMMUNITY)
Admission: EM | Admit: 2021-08-24 | Discharge: 2021-08-24 | Disposition: A | Discharge status: Home / Self Care | Payer: Medicare Other | Attending: Emergency Medicine | Admitting: Emergency Medicine

## 2021-08-24 ENCOUNTER — Encounter (HOSPITAL_COMMUNITY): Payer: Self-pay | Admitting: Emergency Medicine

## 2021-08-24 ENCOUNTER — Emergency Department (HOSPITAL_COMMUNITY): Payer: Medicare Other

## 2021-08-24 DIAGNOSIS — I161 Hypertensive emergency: Secondary | ICD-10-CM

## 2021-08-24 DIAGNOSIS — M79602 Pain in left arm: Secondary | ICD-10-CM | POA: Diagnosis present

## 2021-08-24 DIAGNOSIS — Z79899 Other long term (current) drug therapy: Secondary | ICD-10-CM | POA: Diagnosis not present

## 2021-08-24 DIAGNOSIS — Z992 Dependence on renal dialysis: Secondary | ICD-10-CM | POA: Diagnosis not present

## 2021-08-24 DIAGNOSIS — I120 Hypertensive chronic kidney disease with stage 5 chronic kidney disease or end stage renal disease: Secondary | ICD-10-CM | POA: Diagnosis not present

## 2021-08-24 DIAGNOSIS — Z794 Long term (current) use of insulin: Secondary | ICD-10-CM | POA: Diagnosis not present

## 2021-08-24 DIAGNOSIS — N186 End stage renal disease: Secondary | ICD-10-CM

## 2021-08-24 LAB — I-STAT CHEM 8, ED
BUN: 60 mg/dL — ABNORMAL HIGH (ref 8–23)
Calcium, Ion: 1 mmol/L — ABNORMAL LOW (ref 1.15–1.40)
Chloride: 94 mmol/L — ABNORMAL LOW (ref 98–111)
Creatinine, Ser: 7.3 mg/dL — ABNORMAL HIGH (ref 0.44–1.00)
Glucose, Bld: 147 mg/dL — ABNORMAL HIGH (ref 70–99)
HCT: 38 % (ref 36.0–46.0)
Hemoglobin: 12.9 g/dL (ref 12.0–15.0)
Potassium: 5.5 mmol/L — ABNORMAL HIGH (ref 3.5–5.1)
Sodium: 127 mmol/L — ABNORMAL LOW (ref 135–145)
TCO2: 27 mmol/L (ref 22–32)

## 2021-08-24 MED ORDER — HYDRALAZINE HCL 25 MG PO TABS
50.0000 mg | ORAL_TABLET | Freq: Once | ORAL | Status: AC
Start: 2021-08-24 — End: 2021-08-24
  Administered 2021-08-24: 50 mg via ORAL
  Filled 2021-08-24: qty 2

## 2021-08-24 MED ORDER — SODIUM ZIRCONIUM CYCLOSILICATE 5 G PO PACK
10.0000 g | PACK | Freq: Once | ORAL | Status: AC
  Administered 2021-08-24: 10 g via ORAL
  Filled 2021-08-24: qty 2

## 2021-08-24 MED ORDER — HYDRALAZINE HCL 25 MG PO TABS
50.0000 mg | ORAL_TABLET | Freq: Once | ORAL | Status: AC
  Administered 2021-08-24: 50 mg via ORAL
  Filled 2021-08-24: qty 2

## 2021-08-24 MED ORDER — CARVEDILOL 12.5 MG PO TABS: 12.5000 mg | ORAL_TABLET | Freq: Two times a day (BID) | ORAL | Status: DC

## 2021-08-24 MED ORDER — ACETAMINOPHEN 500 MG PO TABS
500.0000 mg | ORAL_TABLET | Freq: Once | ORAL | Status: AC
  Administered 2021-08-24: 500 mg via ORAL
  Filled 2021-08-24: qty 1

## 2021-08-24 NOTE — ED Provider Notes (Signed)
[facility] EMERGENCY DEPARTMENT Provider Note   CSN: [id number] Arrival date & time: 08/24/21  2012     History {Add pertinent medical, surgical, social history, OB history to HPI:1} Chief Complaint  Patient presents with   Arm Pain    Erica [name] is a 66 y.o. female.  HPI     66 year old female comes in with chief complaint of arm pain.  Patient complains of left arm pain near her dialysis site   Home Medications Prior to Admission medications   Medication Sig Start Date End Date Taking? Authorizing Provider  acetaminophen (TYLENOL) 325 MG tablet Take 650 mg by mouth every 4 (four) hours as needed for moderate pain.    [provider]  albuterol (VENTOLIN HFA) 108 (90 Base) MCG/ACT inhaler Inhale 2 puffs into the lungs every 4 (four) hours as needed for wheezing or shortness of breath.     [provider]  Amino Acids-Protein Hydrolys (FEEDING SUPPLEMENT, PRO-STAT 64,) LIQD Take 30 mLs by mouth in the morning.    [provider]  Biotin 10 MG TABS Take 10 mg by mouth daily.    [provider]  calcitRIOL (ROCALTROL) 0.25 MCG capsule Take 1 capsule (0.25 mcg total) by mouth Every Tuesday,Thursday,and Saturday with dialysis. 12/18/17   Love, Ivan Anchors, PA-C  carvedilol (COREG) 12.5 MG tablet Take 1 tablet (12.5 mg total) by mouth 2 (two) times daily. 09/24/20   Johnson, Clanford L, MD  ferric citrate (AURYXIA) 1 GM 210 MG(Fe) tablet Take 2 tablets (420 mg total) by mouth 3 (three) times daily with meals. 11/15/18   Kinnie Feil, MD  folic acid (FOLVITE) 1 MG tablet Take 1 mg by mouth every morning.    [provider]  hydrALAZINE (APRESOLINE) 50 MG tablet Take 0.5 tablets (25 mg total) by mouth every 8 (eight) hours. 06/21/21   Orson Eva, MD  hydrOXYzine (ATARAX/VISTARIL) 25 MG tablet Take 25 mg by mouth every 12 (twelve) hours as needed for itching. 12/27/20   [provider]  insulin aspart (NOVOLOG) 100 UNIT/ML  injection Inject 1-3 Units into the skin See admin instructions. Inject per sliding scale  If BS 151-250= 1 unit 251- 350= 2 units 351-400= 3 units 401-500= 3 units Give subcutaneous at bedtime    [provider]  levETIRAcetam (KEPPRA) 500 MG tablet Take 500 mg by mouth at bedtime.    [provider]  levETIRAcetam (KEPPRA) 500 MG tablet Take 1 tablet (500 mg total) by mouth 2 (two) times daily. 07/08/21   Daleen Bo, MD  lidocaine-prilocaine (EMLA) cream Apply 1 application. topically as directed. Apply to left arm fistula every tues, thurs, and sat for hemodialysis    [provider]  Multiple Vitamin (MULTIVITAMIN ADULT PO) Take 1 tablet by mouth daily.    [provider]  oxyCODONE (OXY IR/ROXICODONE) 5 MG immediate release tablet Take 1 tablet (5 mg total) by mouth every 6 (six) hours as needed for severe pain. 06/21/21   Orson Eva, MD  pantoprazole (PROTONIX) 40 MG tablet Take 1 tablet (40 mg total) by mouth daily. 11/19/19 06/19/21  Oswald Hillock, MD  traZODone (DESYREL) 150 MG tablet Take 150 mg by mouth at bedtime.    [provider]  vitamin C (ASCORBIC ACID) 500 MG tablet Take 500 mg by mouth 2 (two) times daily.    [provider]  zinc sulfate 220 (50 Zn) MG capsule Take 220 mg by mouth daily.    [provider]      Allergies    Aspirin and Metformin and related    Review of Systems   Review of Systems  Physical Exam Updated Vital Signs BP (!) 210/91   Pulse 76   Temp (!) 97.5 F (36.4 C) (Oral)   Resp 16   Ht 4\' 11"  (1.499 m)   Wt 47.5 kg   SpO2 100%   BMI 21.15 kg/m  Physical Exam  ED Results / Procedures / Treatments   Labs (all labs ordered are listed, but only abnormal results are displayed) Labs Reviewed  I-STAT CHEM 8, ED - Abnormal; Notable for the following components:      Result Value   Sodium 127 (*)    Potassium 5.5 (*)    Chloride 94 (*)    BUN 60 (*)    Creatinine, Ser 7.30 (*)     Glucose, Bld 147 (*)    Calcium, Ion 1.00 (*)    All other components within normal limits    EKG EKG Interpretation  Date/Time:  Sunday August 24 2021 20:31:14 EDT Ventricular Rate:  74 PR Interval:  161 QRS Duration: 89 QT Interval:  435 QTC Calculation: 483 R Axis:   106 Text Interpretation: Sinus rhythm Right axis deviation Low voltage, extremity and precordial leads Anteroseptal infarct, old No acute changes No significant change since last tracing Confirmed by Varney Biles [phone]) on 08/24/2021 9:14:35 PM  Radiology DG Chest Port 1 View  Result Date: 08/24/2021 CLINICAL DATA:  Missed dialysis session with shortness of breath and arm pain, initial encounter EXAM: PORTABLE CHEST 1 VIEW COMPARISON:  09/22/2020 FINDINGS: Check shadow is within normal limits. The lungs are well aerated bilaterally. No focal infiltrate or effusion is seen. No findings of pulmonary edema are noted. Postsurgical changes of the right humerus are seen. IMPRESSION: No acute abnormality noted. Electronically Signed   By: Inez Catalina M.D.   On: 08/24/2021 21:38    Procedures Procedures  {Document cardiac monitor, telemetry assessment procedure when appropriate:1}  Medications Ordered in ED Medications  carvedilol (COREG) tablet 12.5 mg (has no administration in time range)  sodium zirconium cyclosilicate (LOKELMA) packet 10 g (has no administration in time range)  hydrALAZINE (APRESOLINE) tablet 50 mg (has no administration in time range)  carvedilol (COREG) tablet 12.5 mg (has no administration in time range)  hydrALAZINE (APRESOLINE) tablet 50 mg (50 mg Oral Given 08/24/21 2151)  acetaminophen (TYLENOL) tablet 500 mg (500 mg Oral Given 08/24/21 2303)    ED Course/ Medical Decision Making/ A&P                           Medical Decision Making Amount and/or Complexity of Data Reviewed Radiology: ordered.  Risk OTC drugs. Prescription drug management.    Final Clinical Impression(s) / ED  Diagnoses Final diagnoses:  Left arm pain  ESRD (end stage renal disease) on dialysis [facility])  Hypertensive emergency    Rx / DC Orders ED Discharge Orders     None

## 2021-08-24 NOTE — ED Triage Notes (Signed)
Pt BIB [facility] from [facility]. Pt c/o left arm pain near her dialysis fistula, hx of chronic pain. Per facility pt missed her Saturday dialysis, pt was seen here on Thursday and received dialysis here.

## 2021-08-24 NOTE — Discharge Instructions (Addendum)
You are seen in the ER for arm pain.  We recommend that he follow-up with vascular surgery, by calling the number provided to set up an appointment for them to assess you.  Your dialysis team can also assess the graft if there is a concern.  Take Tylenol for pain control.  Ensure that you get your dialysis as soon as possible.

## 2021-09-22 ENCOUNTER — Encounter (HOSPITAL_COMMUNITY): Payer: Self-pay

## 2021-09-22 ENCOUNTER — Emergency Department (HOSPITAL_COMMUNITY)
Admission: EM | Admit: 2021-09-22 | Discharge: 2021-09-23 | Disposition: A | Discharge status: Home / Self Care | Payer: Medicare Other | Attending: Emergency Medicine | Admitting: Emergency Medicine

## 2021-09-22 ENCOUNTER — Other Ambulatory Visit: Payer: Self-pay

## 2021-09-22 DIAGNOSIS — Z794 Long term (current) use of insulin: Secondary | ICD-10-CM | POA: Insufficient documentation

## 2021-09-22 DIAGNOSIS — R112 Nausea with vomiting, unspecified: Secondary | ICD-10-CM | POA: Insufficient documentation

## 2021-09-22 DIAGNOSIS — N186 End stage renal disease: Secondary | ICD-10-CM | POA: Diagnosis not present

## 2021-09-22 DIAGNOSIS — I2510 Atherosclerotic heart disease of native coronary artery without angina pectoris: Secondary | ICD-10-CM | POA: Insufficient documentation

## 2021-09-22 DIAGNOSIS — E119 Type 2 diabetes mellitus without complications: Secondary | ICD-10-CM | POA: Insufficient documentation

## 2021-09-22 DIAGNOSIS — Z992 Dependence on renal dialysis: Secondary | ICD-10-CM | POA: Diagnosis not present

## 2021-09-22 MED ORDER — ONDANSETRON HCL 4 MG/2ML IJ SOLN: 4.0000 mg | Freq: Once | INTRAMUSCULAR | Status: DC

## 2021-09-22 NOTE — ED Triage Notes (Signed)
Pt arrived from home via [facility] with complaints of nausea and vomiting X3 that started today. Pt missed dialysis Saturday

## 2021-09-22 NOTE — ED Provider Notes (Signed)
[facility] Provider Note   CSN: [id number] Arrival date & time: 09/22/21  2321     History  No chief complaint on file.   Erica [name] is a 66 y.o. female.  Patient is a 66 year old female with extensive past medical history including diabetes, coronary artery disease end-stage renal disease on hemodialysis, atrial fibrillation.  Patient presenting today with complaints of vomiting.  This started earlier this evening.  Patient states she did not feel well on Saturday, so skipped her dialysis session.  She denies to me she is having any chest pain or difficulty breathing.  She denies any fevers or chills.  She denies diarrhea.  She denies ill contacts.  There are no alleviating or aggravating factors.  The history is provided by the patient.       Home Medications Prior to Admission medications   Medication Sig Start Date End Date Taking? Authorizing Provider  acetaminophen (TYLENOL) 325 MG tablet Take 650 mg by mouth every 4 (four) hours as needed for moderate pain.    [provider]  albuterol (VENTOLIN HFA) 108 (90 Base) MCG/ACT inhaler Inhale 2 puffs into the lungs every 4 (four) hours as needed for wheezing or shortness of breath.     [provider]  Amino Acids-Protein Hydrolys (FEEDING SUPPLEMENT, PRO-STAT 64,) LIQD Take 30 mLs by mouth in the morning.    [provider]  Biotin 10 MG TABS Take 10 mg by mouth daily.    [provider]  calcitRIOL (ROCALTROL) 0.25 MCG capsule Take 1 capsule (0.25 mcg total) by mouth Every Tuesday,Thursday,and Saturday with dialysis. 12/18/17   Love, Ivan Anchors, PA-C  carvedilol (COREG) 12.5 MG tablet Take 1 tablet (12.5 mg total) by mouth 2 (two) times daily. 09/24/20   Johnson, Clanford L, MD  ferric citrate (AURYXIA) 1 GM 210 MG(Fe) tablet Take 2 tablets (420 mg total) by mouth 3 (three) times daily with meals. 11/15/18   Kinnie Feil, MD  folic acid (FOLVITE) 1 MG tablet Take 1 mg  by mouth every morning.    [provider]  hydrALAZINE (APRESOLINE) 50 MG tablet Take 0.5 tablets (25 mg total) by mouth every 8 (eight) hours. 06/21/21   Orson Eva, MD  hydrOXYzine (ATARAX/VISTARIL) 25 MG tablet Take 25 mg by mouth every 12 (twelve) hours as needed for itching. 12/27/20   [provider]  insulin aspart (NOVOLOG) 100 UNIT/ML injection Inject 1-3 Units into the skin See admin instructions. Inject per sliding scale  If BS 151-250= 1 unit 251- 350= 2 units 351-400= 3 units 401-500= 3 units Give subcutaneous at bedtime    [provider]  levETIRAcetam (KEPPRA) 500 MG tablet Take 500 mg by mouth at bedtime.    [provider]  levETIRAcetam (KEPPRA) 500 MG tablet Take 1 tablet (500 mg total) by mouth 2 (two) times daily. 07/08/21   Daleen Bo, MD  lidocaine-prilocaine (EMLA) cream Apply 1 application. topically as directed. Apply to left arm fistula every tues, thurs, and sat for hemodialysis    [provider]  Multiple Vitamin (MULTIVITAMIN ADULT PO) Take 1 tablet by mouth daily.    [provider]  oxyCODONE (OXY IR/ROXICODONE) 5 MG immediate release tablet Take 1 tablet (5 mg total) by mouth every 6 (six) hours as needed for severe pain. 06/21/21   Orson Eva, MD  pantoprazole (PROTONIX) 40 MG tablet Take 1 tablet (40 mg total) by mouth daily. 11/19/19 06/19/21  Oswald Hillock, MD  traZODone (DESYREL) 150 MG tablet Take 150 mg by mouth at bedtime.    [provider]  vitamin C (ASCORBIC ACID) 500 MG tablet Take 500 mg by mouth 2 (two) times daily.    [provider]  zinc sulfate 220 (50 Zn) MG capsule Take 220 mg by mouth daily.    [provider]      Allergies    Aspirin and Metformin and related    Review of Systems   Review of Systems  All other systems reviewed and are negative.   Physical Exam Updated Vital Signs BP (!) 188/81   Pulse 76   Temp 97.8 F (36.6 C)   Resp 17   Ht 4'  11" (1.499 m)   Wt 47.5 kg   SpO2 96%   BMI 21.15 kg/m  Physical Exam Vitals and nursing note reviewed.  Constitutional:      General: She is not in acute distress.    Appearance: She is well-developed. She is not diaphoretic.  HENT:     Head: Normocephalic and atraumatic.  Cardiovascular:     Rate and Rhythm: Normal rate and regular rhythm.     Heart sounds: No murmur heard.    No friction rub. No gallop.  Pulmonary:     Effort: Pulmonary effort is normal. No respiratory distress.     Breath sounds: Normal breath sounds. No wheezing.  Abdominal:     General: Bowel sounds are normal. There is no distension.     Palpations: Abdomen is soft.     Tenderness: There is no abdominal tenderness.  Musculoskeletal:        General: Normal range of motion.     Cervical back: Normal range of motion and neck supple.  Skin:    General: Skin is warm and dry.  Neurological:     General: No focal deficit present.     Mental Status: She is alert and oriented to person, place, and time.     ED Results / Procedures / Treatments   Labs (all labs ordered are listed, but only abnormal results are displayed) Labs Reviewed - No data to display  EKG None  Radiology No results found.  Procedures Procedures  {Document cardiac monitor, telemetry assessment procedure when appropriate:1}  Medications Ordered in ED Medications  ondansetron (ZOFRAN) injection 4 mg (has no administration in time range)    ED Course/ Medical Decision Making/ A&P                           Medical Decision Making Amount and/or Complexity of Data Reviewed Labs: ordered.  Risk Prescription drug management.   ***  {Document critical care time when appropriate:1} {Document review of labs and clinical decision tools ie heart score, Chads2Vasc2 etc:1}  {Document your independent review of radiology images, and any outside records:1} {Document your discussion with family members, caretakers, and with  consultants:1} {Document social determinants of health affecting pt's care:1} {Document your decision making why or why not admission, treatments were needed:1} Final Clinical Impression(s) / ED Diagnoses Final diagnoses:  None    Rx / DC Orders ED Discharge Orders     None

## 2021-09-23 ENCOUNTER — Encounter (HOSPITAL_COMMUNITY): Payer: Self-pay

## 2021-09-23 DIAGNOSIS — R112 Nausea with vomiting, unspecified: Secondary | ICD-10-CM | POA: Diagnosis not present

## 2021-09-23 LAB — LIPASE, BLOOD: Lipase: 18 U/L (ref 11–51)

## 2021-09-23 LAB — CBC WITH DIFFERENTIAL/PLATELET
Abs Immature Granulocytes: 0.01 10*3/uL (ref 0.00–0.07)
Basophils Absolute: 0 10*3/uL (ref 0.0–0.1)
Basophils Relative: 1 %
Eosinophils Absolute: 0.2 10*3/uL (ref 0.0–0.5)
Eosinophils Relative: 4 %
HCT: 35.8 % — ABNORMAL LOW (ref 36.0–46.0)
Hemoglobin: 11.2 g/dL — ABNORMAL LOW (ref 12.0–15.0)
Immature Granulocytes: 0 %
Lymphocytes Relative: 28 %
Lymphs Abs: 1.2 10*3/uL (ref 0.7–4.0)
MCH: 31.5 pg (ref 26.0–34.0)
MCHC: 31.3 g/dL (ref 30.0–36.0)
MCV: 100.6 fL — ABNORMAL HIGH (ref 80.0–100.0)
Monocytes Absolute: 0.4 10*3/uL (ref 0.1–1.0)
Monocytes Relative: 8 %
Neutro Abs: 2.6 10*3/uL (ref 1.7–7.7)
Neutrophils Relative %: 59 %
Platelets: 102 10*3/uL — ABNORMAL LOW (ref 150–400)
RBC: 3.56 MIL/uL — ABNORMAL LOW (ref 3.87–5.11)
RDW: 13.6 % (ref 11.5–15.5)
WBC: 4.3 10*3/uL (ref 4.0–10.5)
nRBC: 0 % (ref 0.0–0.2)

## 2021-09-23 LAB — COMPREHENSIVE METABOLIC PANEL
ALT: 27 U/L (ref 0–44)
AST: 29 U/L (ref 15–41)
Albumin: 3.4 g/dL — ABNORMAL LOW (ref 3.5–5.0)
Alkaline Phosphatase: 184 U/L — ABNORMAL HIGH (ref 38–126)
Anion gap: 9 (ref 5–15)
BUN: 33 mg/dL — ABNORMAL HIGH (ref 8–23)
CO2: 22 mmol/L (ref 22–32)
Calcium: 8.6 mg/dL — ABNORMAL LOW (ref 8.9–10.3)
Chloride: 105 mmol/L (ref 98–111)
Creatinine, Ser: 6.85 mg/dL — ABNORMAL HIGH (ref 0.44–1.00)
GFR, Estimated: 6 mL/min — ABNORMAL LOW (ref >60)
Glucose, Bld: 99 mg/dL (ref 70–99)
Potassium: 4.1 mmol/L (ref 3.5–5.1)
Sodium: 136 mmol/L (ref 135–145)
Total Bilirubin: 1 mg/dL (ref 0.3–1.2)
Total Protein: 6.6 g/dL (ref 6.5–8.1)

## 2021-09-23 MED ORDER — ONDANSETRON 8 MG PO TBDP: ORAL_TABLET | ORAL | 0 refills | Status: DC

## 2021-09-23 MED ORDER — CLONIDINE HCL 0.1 MG PO TABS
0.1000 mg | ORAL_TABLET | Freq: Once | ORAL | Status: AC
  Administered 2021-09-23: 0.1 mg via ORAL
  Filled 2021-09-23: qty 1

## 2021-09-23 MED ORDER — ONDANSETRON 4 MG PO TBDP
4.0000 mg | ORAL_TABLET | Freq: Once | ORAL | Status: AC
  Administered 2021-09-23: 4 mg via ORAL
  Filled 2021-09-23: qty 1

## 2021-09-23 NOTE — ED Notes (Signed)
Pt bp trending high, MD notified.

## 2021-09-23 NOTE — ED Notes (Signed)
Contacted pt son to pick up pt for discharge, son states he is unable to pick up pt. He explains [facility] normally comes to transport pt to dialysis for 10am appointment. Will try to arrange this in am.

## 2021-09-23 NOTE — ED Notes (Signed)
Attempted IV X2 access with no success

## 2021-09-23 NOTE — Discharge Instructions (Signed)
Begin taking Zofran as prescribed.  Go to your dialysis session as scheduled.  Return to the emergency department if your symptoms significantly worsen or change.

## 2021-10-12 ENCOUNTER — Emergency Department (HOSPITAL_COMMUNITY): Payer: Medicare Other

## 2021-10-12 ENCOUNTER — Inpatient Hospital Stay (HOSPITAL_COMMUNITY)
Admission: EM | Admit: 2021-10-12 | Discharge: 2021-10-19 | DRG: 391 | Disposition: A | Discharge status: Skilled Nursing Facility | Payer: Medicare Other | Attending: Internal Medicine | Admitting: Internal Medicine

## 2021-10-12 ENCOUNTER — Encounter (HOSPITAL_COMMUNITY): Payer: Self-pay

## 2021-10-12 ENCOUNTER — Other Ambulatory Visit: Payer: Self-pay

## 2021-10-12 DIAGNOSIS — E875 Hyperkalemia: Secondary | ICD-10-CM | POA: Diagnosis not present

## 2021-10-12 DIAGNOSIS — Z79899 Other long term (current) drug therapy: Secondary | ICD-10-CM

## 2021-10-12 DIAGNOSIS — M898X9 Other specified disorders of bone, unspecified site: Secondary | ICD-10-CM | POA: Diagnosis present

## 2021-10-12 DIAGNOSIS — Z20822 Contact with and (suspected) exposure to covid-19: Secondary | ICD-10-CM | POA: Diagnosis present

## 2021-10-12 DIAGNOSIS — Z87442 Personal history of urinary calculi: Secondary | ICD-10-CM

## 2021-10-12 DIAGNOSIS — R778 Other specified abnormalities of plasma proteins: Secondary | ICD-10-CM | POA: Diagnosis not present

## 2021-10-12 DIAGNOSIS — E876 Hypokalemia: Secondary | ICD-10-CM | POA: Diagnosis present

## 2021-10-12 DIAGNOSIS — D539 Nutritional anemia, unspecified: Secondary | ICD-10-CM | POA: Diagnosis present

## 2021-10-12 DIAGNOSIS — F17290 Nicotine dependence, other tobacco product, uncomplicated: Secondary | ICD-10-CM | POA: Diagnosis present

## 2021-10-12 DIAGNOSIS — R1030 Lower abdominal pain, unspecified: Secondary | ICD-10-CM | POA: Diagnosis not present

## 2021-10-12 DIAGNOSIS — N2581 Secondary hyperparathyroidism of renal origin: Secondary | ICD-10-CM | POA: Diagnosis present

## 2021-10-12 DIAGNOSIS — D62 Acute posthemorrhagic anemia: Secondary | ICD-10-CM | POA: Diagnosis present

## 2021-10-12 DIAGNOSIS — I132 Hypertensive heart and chronic kidney disease with heart failure and with stage 5 chronic kidney disease, or end stage renal disease: Secondary | ICD-10-CM | POA: Diagnosis present

## 2021-10-12 DIAGNOSIS — G40909 Epilepsy, unspecified, not intractable, without status epilepticus: Secondary | ICD-10-CM

## 2021-10-12 DIAGNOSIS — I10 Essential (primary) hypertension: Secondary | ICD-10-CM | POA: Diagnosis present

## 2021-10-12 DIAGNOSIS — N186 End stage renal disease: Secondary | ICD-10-CM | POA: Diagnosis present

## 2021-10-12 DIAGNOSIS — R1310 Dysphagia, unspecified: Secondary | ICD-10-CM | POA: Diagnosis present

## 2021-10-12 DIAGNOSIS — R7989 Other specified abnormal findings of blood chemistry: Secondary | ICD-10-CM

## 2021-10-12 DIAGNOSIS — E871 Hypo-osmolality and hyponatremia: Secondary | ICD-10-CM | POA: Diagnosis present

## 2021-10-12 DIAGNOSIS — R64 Cachexia: Secondary | ICD-10-CM | POA: Diagnosis present

## 2021-10-12 DIAGNOSIS — E1142 Type 2 diabetes mellitus with diabetic polyneuropathy: Secondary | ICD-10-CM | POA: Diagnosis present

## 2021-10-12 DIAGNOSIS — K219 Gastro-esophageal reflux disease without esophagitis: Secondary | ICD-10-CM | POA: Diagnosis not present

## 2021-10-12 DIAGNOSIS — K529 Noninfective gastroenteritis and colitis, unspecified: Secondary | ICD-10-CM | POA: Diagnosis present

## 2021-10-12 DIAGNOSIS — E1165 Type 2 diabetes mellitus with hyperglycemia: Secondary | ICD-10-CM | POA: Diagnosis present

## 2021-10-12 DIAGNOSIS — I248 Other forms of acute ischemic heart disease: Secondary | ICD-10-CM | POA: Diagnosis present

## 2021-10-12 DIAGNOSIS — E782 Mixed hyperlipidemia: Secondary | ICD-10-CM | POA: Diagnosis present

## 2021-10-12 DIAGNOSIS — I5032 Chronic diastolic (congestive) heart failure: Secondary | ICD-10-CM | POA: Diagnosis present

## 2021-10-12 DIAGNOSIS — Z9841 Cataract extraction status, right eye: Secondary | ICD-10-CM

## 2021-10-12 DIAGNOSIS — Z681 Body mass index (BMI) 19 or less, adult: Secondary | ICD-10-CM | POA: Diagnosis not present

## 2021-10-12 DIAGNOSIS — Z961 Presence of intraocular lens: Secondary | ICD-10-CM | POA: Diagnosis present

## 2021-10-12 DIAGNOSIS — F32A Depression, unspecified: Secondary | ICD-10-CM | POA: Diagnosis present

## 2021-10-12 DIAGNOSIS — Z992 Dependence on renal dialysis: Secondary | ICD-10-CM

## 2021-10-12 DIAGNOSIS — D696 Thrombocytopenia, unspecified: Secondary | ICD-10-CM | POA: Diagnosis present

## 2021-10-12 DIAGNOSIS — F1011 Alcohol abuse, in remission: Secondary | ICD-10-CM | POA: Diagnosis present

## 2021-10-12 DIAGNOSIS — D649 Anemia, unspecified: Secondary | ICD-10-CM | POA: Diagnosis not present

## 2021-10-12 DIAGNOSIS — R112 Nausea with vomiting, unspecified: Secondary | ICD-10-CM | POA: Diagnosis present

## 2021-10-12 DIAGNOSIS — R188 Other ascites: Secondary | ICD-10-CM | POA: Diagnosis present

## 2021-10-12 DIAGNOSIS — E1122 Type 2 diabetes mellitus with diabetic chronic kidney disease: Secondary | ICD-10-CM | POA: Diagnosis present

## 2021-10-12 DIAGNOSIS — K2970 Gastritis, unspecified, without bleeding: Secondary | ICD-10-CM | POA: Diagnosis present

## 2021-10-12 DIAGNOSIS — Z888 Allergy status to other drugs, medicaments and biological substances status: Secondary | ICD-10-CM

## 2021-10-12 DIAGNOSIS — Z794 Long term (current) use of insulin: Secondary | ICD-10-CM

## 2021-10-12 DIAGNOSIS — K922 Gastrointestinal hemorrhage, unspecified: Secondary | ICD-10-CM | POA: Diagnosis not present

## 2021-10-12 DIAGNOSIS — Z9842 Cataract extraction status, left eye: Secondary | ICD-10-CM

## 2021-10-12 DIAGNOSIS — I2510 Atherosclerotic heart disease of native coronary artery without angina pectoris: Secondary | ICD-10-CM | POA: Diagnosis present

## 2021-10-12 DIAGNOSIS — E538 Deficiency of other specified B group vitamins: Secondary | ICD-10-CM | POA: Diagnosis present

## 2021-10-12 DIAGNOSIS — Z833 Family history of diabetes mellitus: Secondary | ICD-10-CM

## 2021-10-12 DIAGNOSIS — Z9049 Acquired absence of other specified parts of digestive tract: Secondary | ICD-10-CM

## 2021-10-12 DIAGNOSIS — Z886 Allergy status to analgesic agent status: Secondary | ICD-10-CM

## 2021-10-12 LAB — CBC WITH DIFFERENTIAL/PLATELET
Abs Immature Granulocytes: 0.01 10*3/uL (ref 0.00–0.07)
Basophils Absolute: 0 10*3/uL (ref 0.0–0.1)
Basophils Relative: 0 %
Eosinophils Absolute: 0.1 10*3/uL (ref 0.0–0.5)
Eosinophils Relative: 3 %
HCT: 29.6 % — ABNORMAL LOW (ref 36.0–46.0)
Hemoglobin: 9.3 g/dL — ABNORMAL LOW (ref 12.0–15.0)
Immature Granulocytes: 0 %
Lymphocytes Relative: 27 %
Lymphs Abs: 1.4 10*3/uL (ref 0.7–4.0)
MCH: 32 pg (ref 26.0–34.0)
MCHC: 31.4 g/dL (ref 30.0–36.0)
MCV: 101.7 fL — ABNORMAL HIGH (ref 80.0–100.0)
Monocytes Absolute: 0.4 10*3/uL (ref 0.1–1.0)
Monocytes Relative: 8 %
Neutro Abs: 3.2 10*3/uL (ref 1.7–7.7)
Neutrophils Relative %: 62 %
Platelets: 114 10*3/uL — ABNORMAL LOW (ref 150–400)
RBC: 2.91 MIL/uL — ABNORMAL LOW (ref 3.87–5.11)
RDW: 13.8 % (ref 11.5–15.5)
WBC: 5.2 10*3/uL (ref 4.0–10.5)
nRBC: 0 % (ref 0.0–0.2)

## 2021-10-12 LAB — CBG MONITORING, ED: Glucose-Capillary: 146 mg/dL — ABNORMAL HIGH (ref 70–99)

## 2021-10-12 LAB — COMPREHENSIVE METABOLIC PANEL
ALT: 35 U/L (ref 0–44)
AST: 31 U/L (ref 15–41)
Albumin: 3.2 g/dL — ABNORMAL LOW (ref 3.5–5.0)
Alkaline Phosphatase: 253 U/L — ABNORMAL HIGH (ref 38–126)
Anion gap: 9 (ref 5–15)
BUN: 35 mg/dL — ABNORMAL HIGH (ref 8–23)
CO2: 26 mmol/L (ref 22–32)
Calcium: 10.3 mg/dL (ref 8.9–10.3)
Chloride: 98 mmol/L (ref 98–111)
Creatinine, Ser: 5.73 mg/dL — ABNORMAL HIGH (ref 0.44–1.00)
GFR, Estimated: 8 mL/min — ABNORMAL LOW (ref >60)
Glucose, Bld: 170 mg/dL — ABNORMAL HIGH (ref 70–99)
Potassium: 5.3 mmol/L — ABNORMAL HIGH (ref 3.5–5.1)
Sodium: 133 mmol/L — ABNORMAL LOW (ref 135–145)
Total Bilirubin: 0.9 mg/dL (ref 0.3–1.2)
Total Protein: 6.4 g/dL — ABNORMAL LOW (ref 6.5–8.1)

## 2021-10-12 LAB — TROPONIN I (HIGH SENSITIVITY)
Troponin I (High Sensitivity): 19 ng/L — ABNORMAL HIGH (ref <18)
Troponin I (High Sensitivity): 15 ng/L (ref <18)

## 2021-10-12 LAB — TYPE AND SCREEN
ABO/RH(D): B POS
Antibody Screen: NEGATIVE

## 2021-10-12 LAB — LIPASE, BLOOD: Lipase: 19 U/L (ref 11–51)

## 2021-10-12 LAB — ETHANOL: Alcohol, Ethyl (B): <10 mg/dL (ref <10)

## 2021-10-12 LAB — POC OCCULT BLOOD, ED: Fecal Occult Bld: POSITIVE — AB

## 2021-10-12 MED ORDER — ONDANSETRON HCL 4 MG/2ML IJ SOLN
4.0000 mg | Freq: Once | INTRAMUSCULAR | Status: AC
  Administered 2021-10-12: 4 mg via INTRAVENOUS
  Filled 2021-10-12: qty 2

## 2021-10-12 MED ORDER — SODIUM ZIRCONIUM CYCLOSILICATE 5 G PO PACK
10.0000 g | PACK | Freq: Once | ORAL | Status: AC
Start: 2021-10-12 — End: 2021-10-12
  Administered 2021-10-12: 10 g via ORAL
  Filled 2021-10-12: qty 2

## 2021-10-12 MED ORDER — FENTANYL CITRATE PF 50 MCG/ML IJ SOSY
50.0000 ug | PREFILLED_SYRINGE | Freq: Once | INTRAMUSCULAR | Status: AC
  Administered 2021-10-12: 50 ug via INTRAVENOUS
  Filled 2021-10-12: qty 1

## 2021-10-12 MED ORDER — PIPERACILLIN-TAZOBACTAM IN DEX 2-0.25 GM/50ML IV SOLN
2.2500 g | Freq: Three times a day (TID) | INTRAVENOUS | Status: DC
  Filled 2021-10-12 (×5): qty 50

## 2021-10-12 MED ORDER — PIPERACILLIN-TAZOBACTAM 3.375 G IVPB 30 MIN
3.3750 g | Freq: Once | INTRAVENOUS | Status: AC
Start: 2021-10-12 — End: 2021-10-12
  Administered 2021-10-12: 3.375 g via INTRAVENOUS
  Filled 2021-10-12: qty 50

## 2021-10-12 NOTE — ED Provider Notes (Signed)
[facility] EMERGENCY DEPARTMENT Provider Note   CSN: [id number] Arrival date & time: 10/12/21  1755     History  Chief Complaint  Patient presents with   Emesis   Headache   Fatigue    CECILY LAWHORNE is a 66 y.o. female.  HPI 66 year old female presents with vomiting and not feeling well. She missed dialysis yesterday due to not feeling well. Patient tells me she vomited yesterday. Her son states over the phone that she refused to go to dialysis due to not feeling well. Vomited this morning after breakfast. No known fevers. She has chronic confusion. Patient is complaining of a headache. She also endorses abdominal pain. No chest pain/dyspnea. Son states she complained of back pain when EMS picked her up.   Home Medications Prior to Admission medications   Medication Sig Start Date End Date Taking? Authorizing Provider  acetaminophen (TYLENOL) 325 MG tablet Take 650 mg by mouth every 4 (four) hours as needed for moderate pain.    [provider]  albuterol (VENTOLIN HFA) 108 (90 Base) MCG/ACT inhaler Inhale 2 puffs into the lungs every 4 (four) hours as needed for wheezing or shortness of breath.     [provider]  Amino Acids-Protein Hydrolys (FEEDING SUPPLEMENT, PRO-STAT 64,) LIQD Take 30 mLs by mouth in the morning.    [provider]  Biotin 10 MG TABS Take 10 mg by mouth daily.    [provider]  calcitRIOL (ROCALTROL) 0.25 MCG capsule Take 1 capsule (0.25 mcg total) by mouth Every Tuesday,Thursday,and Saturday with dialysis. 12/18/17   Love, Ivan Anchors, PA-C  carvedilol (COREG) 12.5 MG tablet Take 1 tablet (12.5 mg total) by mouth 2 (two) times daily. 09/24/20   Johnson, Clanford L, MD  ferric citrate (AURYXIA) 1 GM 210 MG(Fe) tablet Take 2 tablets (420 mg total) by mouth 3 (three) times daily with meals. 11/15/18   Kinnie Feil, MD  folic acid (FOLVITE) 1 MG tablet Take 1 mg by mouth every morning.    [provider]   hydrALAZINE (APRESOLINE) 50 MG tablet Take 0.5 tablets (25 mg total) by mouth every 8 (eight) hours. 06/21/21   Orson Eva, MD  hydrOXYzine (ATARAX/VISTARIL) 25 MG tablet Take 25 mg by mouth every 12 (twelve) hours as needed for itching. 12/27/20   [provider]  insulin aspart (NOVOLOG) 100 UNIT/ML injection Inject 1-3 Units into the skin See admin instructions. Inject per sliding scale  If BS 151-250= 1 unit 251- 350= 2 units 351-400= 3 units 401-500= 3 units Give subcutaneous at bedtime    [provider]  levETIRAcetam (KEPPRA) 500 MG tablet Take 500 mg by mouth at bedtime.    [provider]  levETIRAcetam (KEPPRA) 500 MG tablet Take 1 tablet (500 mg total) by mouth 2 (two) times daily. 07/08/21   Daleen Bo, MD  lidocaine-prilocaine (EMLA) cream Apply 1 application. topically as directed. Apply to left arm fistula every tues, thurs, and sat for hemodialysis    [provider]  Multiple Vitamin (MULTIVITAMIN ADULT PO) Take 1 tablet by mouth daily.    [provider]  ondansetron (ZOFRAN-ODT) 8 MG disintegrating tablet 8mg  ODT q4 hours prn nausea 09/23/21   Veryl Speak, MD  oxyCODONE (OXY IR/ROXICODONE) 5 MG immediate release tablet Take 1 tablet (5 mg total) by mouth every 6 (six) hours as needed for severe pain. 06/21/21   Orson Eva, MD  pantoprazole (PROTONIX) 40 MG tablet Take 1 tablet (40 mg  total) by mouth daily. 11/19/19 06/19/21  Oswald Hillock, MD  traZODone (DESYREL) 150 MG tablet Take 150 mg by mouth at bedtime.    [provider]  vitamin C (ASCORBIC ACID) 500 MG tablet Take 500 mg by mouth 2 (two) times daily.    [provider]  zinc sulfate 220 (50 Zn) MG capsule Take 220 mg by mouth daily.    [provider]      Allergies    Aspirin and Metformin and related    Review of Systems   Review of Systems  Physical Exam Updated Vital Signs BP (!) 171/70   Pulse 65   Temp 98.1 F (36.7 C) (Oral)    Resp 13   SpO2 96%  Physical Exam Vitals and nursing note reviewed. Exam conducted with a chaperone present.  Constitutional:      Appearance: She is well-developed.  HENT:     Head: Normocephalic and atraumatic.  Eyes:     Pupils: Pupils are equal, round, and reactive to light.  Cardiovascular:     Rate and Rhythm: Normal rate and regular rhythm.     Heart sounds: Normal heart sounds.  Pulmonary:     Effort: Pulmonary effort is normal.     Breath sounds: Normal breath sounds.  Abdominal:     Palpations: Abdomen is soft.     Tenderness: There is abdominal tenderness in the right upper quadrant, epigastric area and left upper quadrant.  Genitourinary:    Rectum: Guaiac result positive.     Comments: Gross blood or melena on rectal exam but is heme positive Musculoskeletal:     Cervical back: No rigidity.  Skin:    General: Skin is warm and dry.  Neurological:     Mental Status: She is alert. She is disoriented.     Comments: Equal strength in both upper extremities.  Mild weakness to the left lower extremity.  More significant weakness to the right lower extremity though is hard to tell if this is from right hip pain versus weakness.  Patient is having a hard time herself differentiating.     ED Results / Procedures / Treatments   Labs (all labs ordered are listed, but only abnormal results are displayed) Labs Reviewed  COMPREHENSIVE METABOLIC PANEL - Abnormal; Notable for the following components:      Result Value   Sodium 133 (*)    Potassium 5.3 (*)    Glucose, Bld 170 (*)    BUN 35 (*)    Creatinine, Ser 5.73 (*)    Total Protein 6.4 (*)    Albumin 3.2 (*)    Alkaline Phosphatase 253 (*)    GFR, Estimated 8 (*)    All other components within normal limits  CBC WITH DIFFERENTIAL/PLATELET - Abnormal; Notable for the following components:   RBC 2.91 (*)    Hemoglobin 9.3 (*)    HCT 29.6 (*)    MCV 101.7 (*)    Platelets 114 (*)    All other components within  normal limits  CBG MONITORING, ED - Abnormal; Notable for the following components:   Glucose-Capillary 146 (*)    All other components within normal limits  POC OCCULT BLOOD, ED - Abnormal; Notable for the following components:   Fecal Occult Bld POSITIVE (*)    All other components within normal limits  TROPONIN I (HIGH SENSITIVITY) - Abnormal; Notable for the following components:   Troponin I (High Sensitivity) 19 (*)    All other  components within normal limits  RESP PANEL BY RT-PCR (FLU A&B, COVID) ARPGX2  ETHANOL  LIPASE, BLOOD  URINALYSIS, ROUTINE W REFLEX MICROSCOPIC  TYPE AND SCREEN  TROPONIN I (HIGH SENSITIVITY)    EKG EKG Interpretation  Date/Time:  Sunday October 12 2021 18:10:59 EDT Ventricular Rate:  66 PR Interval:  174 QRS Duration: 78 QT Interval:  407 QTC Calculation: 427 R Axis:   -4 Text Interpretation: Sinus rhythm Borderline low voltage, extremity leads Confirmed by Sherwood Gambler [phone]) on 10/12/2021 10:39:24 PM  Radiology DG Chest Portable 1 View  Result Date: 10/12/2021 CLINICAL DATA:  Nausea and vomiting. EXAM: PORTABLE CHEST 1 VIEW COMPARISON:  August 24, 2021 FINDINGS: The cardiac silhouette is borderline in size. There is no evidence of acute infiltrate, pleural effusion or pneumothorax. The visualized skeletal structures are unremarkable. IMPRESSION: No active cardiopulmonary disease. Electronically Signed   By: Virgina Norfolk M.D.   On: 10/12/2021 19:04   CT ABDOMEN PELVIS WO CONTRAST  Result Date: 10/12/2021 CLINICAL DATA:  Nausea and vomiting. EXAM: CT ABDOMEN AND PELVIS WITHOUT CONTRAST TECHNIQUE: Multidetector CT imaging of the abdomen and pelvis was performed following the standard protocol without IV contrast. RADIATION DOSE REDUCTION: This exam was performed according to the departmental dose-optimization program which includes automated exposure control, adjustment of the mA and/or kV according to patient size and/or use of iterative  reconstruction technique. COMPARISON:  CT abdomen and pelvis 08/15/2020. FINDINGS: Lower chest: No acute abnormality.  Heart is enlarged. Hepatobiliary: No focal liver abnormality is seen. Status post cholecystectomy. No biliary dilatation. Pancreas: Pancreas is diffusely atrophic with coarse calcifications compatible with chronic pancreatitis, unchanged. No acute inflammatory changes. Spleen: Normal in size without focal abnormality. Adrenals/Urinary Tract: There is some punctate nonobstructing bilateral renal calculi. Prominent renal vascular calcifications are again seen. There is no hydronephrosis. No ureteral calculi are seen. The adrenal glands are within normal limits. The bladder is under distended and not well evaluated. Stomach/Bowel: There is rectosigmoid wall thickening with mild surrounding inflammation. No dilated bowel loops, pneumatosis or free air. There is questionable wall thickening of the ascending colon. Small bowel is within normal limits. The stomach is decompressed. Gastric wall thickening not excluded. Vascular/Lymphatic: No significant vascular findings are present. No enlarged abdominal or pelvic lymph nodes. Extensive peripheral vascular calcifications are present. Reproductive: Uterus and bilateral adnexa are unremarkable. Other: Small volume ascites.  Body wall edema.  No focal hernia. Musculoskeletal: The bones are osteopenic. Chronic compression deformities of T10, T12 and L2 appear similar to the prior study. Right hip arthroplasty is similar to the prior study. IMPRESSION: 1. Rectosigmoid wall thickening and inflammation compatible with proctocolitis. 2. Questionable ascending colitis. 3. Questionable gastritis. 4. Small volume ascites and body wall edema. 5. Nonobstructing bilateral renal calculi. 6.  Aortic Atherosclerosis (ICD10-I70.0). Electronically Signed   By: Ronney Asters M.D.   On: 10/12/2021 19:03   CT Head Wo Contrast  Result Date: 10/12/2021 CLINICAL DATA:  Mental  status change EXAM: CT HEAD WITHOUT CONTRAST TECHNIQUE: Contiguous axial images were obtained from the base of the skull through the vertex without intravenous contrast. RADIATION DOSE REDUCTION: This exam was performed according to the departmental dose-optimization program which includes automated exposure control, adjustment of the mA and/or kV according to patient size and/or use of iterative reconstruction technique. COMPARISON:  Head CT dated August 21, 2021 FINDINGS: Brain: No evidence of acute infarction, hemorrhage, hydrocephalus, extra-axial collection or mass lesion/mass effect. Vascular: No hyperdense vessel or unexpected calcification. Skull: Normal. Negative for fracture  or focal lesion. Sinuses/Orbits: No acute finding. Other: None. IMPRESSION: No acute intracranial abnormality. Electronically Signed   By: Yetta Glassman M.D.   On: 10/12/2021 18:57    Procedures Procedures    Medications Ordered in ED Medications  sodium zirconium cyclosilicate (LOKELMA) packet 10 g (has no administration in time range)  ondansetron (ZOFRAN) injection 4 mg (4 mg Intravenous Given 10/12/21 1958)  fentaNYL (SUBLIMAZE) injection 50 mcg (50 mcg Intravenous Given 10/12/21 1959)    ED Course/ Medical Decision Making/ A&P                           Medical Decision Making Amount and/or Complexity of Data Reviewed Independent Historian:     Details: Son, who she lives with Labs: ordered.    Details: Mild hyperkalemia at 5.3.  Hemoglobin of 9.3 is lower than most recent of 11 a couple weeks ago Radiology: ordered and independent interpretation performed.    Details: CT head without head bleed.  CT abdomen and pelvis with proctocolitis. ECG/medicine tests: independent interpretation performed.    Details: No signs of hyperkalemia  Risk Prescription drug management. Decision regarding hospitalization.   Patient feels better after some fentanyl and Zofran here.  She is hypertensive though moderately  so.  Her hemoglobin is lower than normal as above and with her colitis I did a rectal exam and there is some heme positive stool.  At this point I think is reasonable to keep her overnight and get serial CBCs.  She will need dialysis as she missed yesterday's dialysis but not emergently.  I will give her a dose of Lokelma.  Otherwise, I discussed with Dr. Josephine Cables for admission.  Updated the son over the phone.        Final Clinical Impression(s) / ED Diagnoses Final diagnoses:  Colitis    Rx / DC Orders ED Discharge Orders     None         Sherwood Gambler, MD 10/12/21 2240

## 2021-10-12 NOTE — H&P (Signed)
History and Physical    Patient: Erica [name]:[id number] DOB: 13-Jun-1955 DOA: 10/12/2021 DOS: the patient was seen and examined on 10/13/2021 PCP: System, Provider Not In  Patient coming from: Home  Chief Complaint:  Chief Complaint  Patient presents with   Emesis   Headache   Fatigue   HPI: Erica [name] is a 66 y.o. female with medical history significant of T2DM, hypertension, hyperlipidemia, ESRD on HD (TTS) seizure disorder, CAD who presents to the emergency department via EMS due to nausea and vomiting which started yesterday, she vomited again this morning after breakfast.  Vomitus was nonbloody and she denies abdominal pain.  Patient has chronic confusion, patient missed her dialysis yesterday due to not feeling well.  She denies chest pain, shortness of breath, fever, chills, diarrhea.   ED Course:  In the emergency department, BP on arrival was 183/71 and old of vital signs were within normal range.  Work-up in the ED showed macrocytic anemia and thrombocytopenia, BMP showed hyponatremia, hyperkalemia, BUNs/creatinine 35/5.73, EGFR 8, CBG 170, ALP 253.  FOBT was positive, lipase 19, troponin x2- 19 > 15, alcohol less than 10. Chest x-ray showed no acute cardiopulmonary disease CT abdomen and pelvis without contrast showed rectosigmoid wall thickening and inflammation compatible with proctocolitis with questionable ascending colitis and questionable gastritis.  Small volume ascites and body wall edema and nonobstructing bilateral renal calculi also noted. CT head without contrast showed no acute intracranial abnormality Patient was treated with IV fentanyl and, Zofran was given, Lokelma was given due to hyperkalemia and she was empirically started on Zosyn due to presumed colitis. Hospitalist was asked to admit patient for further evaluation and management.  Review of Systems: Review of systems as noted in the HPI. All other systems reviewed and are  negative.   Past Medical History:  Diagnosis Date   Alcohol-induced pancreatitis    CAD (coronary artery disease) 09/17/2020   Chronic diarrhea    Depression    Diabetes mellitus    fasting blood sugar 110-120s   Dialysis patient ([place])    Diastolic CHF ([facility])    DKA (diabetic ketoacidoses)    Gastroparesis    GERD (gastroesophageal reflux disease)    Heart murmur    History of kidney stones    Hyperlipidemia    Hypertension    Hypokalemia    Muscle spasm    Neuropathic pain    Neuropathy    Hx: of   Pyelonephritis    Seizures ([place])    Vitamin B12 deficiency    Vitamin D deficiency    Past Surgical History:  Procedure Laterality Date   AV FISTULA PLACEMENT Left 12/15/2017   Procedure: INSERTION OF ARTERIOVENOUS (AV) GORE-TEX GRAFT ARM;  Surgeon: Rosetta Posner, MD;  Location: [facility] OR;  Service: Vascular;  Laterality: Left;   CATARACT EXTRACTION W/PHACO Right 03/14/2015   Procedure: CATARACT EXTRACTION PHACO AND INTRAOCULAR LENS PLACEMENT ([place]);  Surgeon: Baruch Goldmann, MD;  Location: [facility];  Service: Ophthalmology;  Laterality: Right;  CDE:11.13   CATARACT EXTRACTION W/PHACO Left 04/11/2015   Procedure: CATARACT EXTRACTION PHACO AND INTRAOCULAR LENS PLACEMENT LEFT EYE CDE=9.68;  Surgeon: Baruch Goldmann, MD;  Location: [facility];  Service: Ophthalmology;  Laterality: Left;   COLONOSCOPY  02/24/2010   cystoscopy with ureteral stent  Bilateral 10/07/2017   At [facility] CV LINE RIGHT  11/17/2017   IR FLUORO GUIDE CV LINE RIGHT  11/22/2017   IR REMOVAL TUN CV CATH W/O  FL  01/26/2018   IR US GUIDE VASC ACCESS RIGHT  11/17/2017   MULTIPLE EXTRACTIONS WITH ALVEOLOPLASTY N/A 06/13/2012   Procedure: MULTIPLE EXTRACION WITH ALVEOLOPLASTY EXTRACT: 18, 19, 20, 21, 22, 24, 25, 27, 28, 29, 30, 31;  Surgeon: Gae Bon, DDS;  Location: [facility];  Service: Oral Surgery;  Laterality: N/A;   ORIF HUMERUS FRACTURE Right 04/20/2019   Procedure: OPEN REDUCTION INTERNAL FIXATION  (ORIF) PROXIMAL HUMERUS FRACTURE;  Surgeon: Marchia Bond, MD;  Location: [facility];  Service: Orthopedics;  Laterality: Right;   TUBAL LIGATION     URETERAL STENT PLACEMENT  09/2017    Social History:  reports that she has never smoked. Her smokeless tobacco use includes snuff. She reports that she does not currently use alcohol after a past usage of about 1.0 standard drink of alcohol per week. She reports that she does not use drugs.   Allergies  Allergen Reactions   Aspirin Palpitations and Other (See Comments)    Listed on [facility] 11/12/18   Metformin And Related     Family History  Problem Relation Age of Onset   Diabetes Sister    Chronic Renal Failure Neg Hx      Prior to Admission medications   Medication Sig Start Date End Date Taking? Authorizing Provider  acetaminophen (TYLENOL) 325 MG tablet Take 650 mg by mouth every 4 (four) hours as needed for moderate pain.    [provider]  albuterol (VENTOLIN HFA) 108 (90 Base) MCG/ACT inhaler Inhale 2 puffs into the lungs every 4 (four) hours as needed for wheezing or shortness of breath.     [provider]  Amino Acids-Protein Hydrolys (FEEDING SUPPLEMENT, PRO-STAT 64,) LIQD Take 30 mLs by mouth in the morning.    [provider]  Biotin 10 MG TABS Take 10 mg by mouth daily.    [provider]  calcitRIOL (ROCALTROL) 0.25 MCG capsule Take 1 capsule (0.25 mcg total) by mouth Every Tuesday,Thursday,and Saturday with dialysis. 12/18/17   Love, Ivan Anchors, PA-C  carvedilol (COREG) 12.5 MG tablet Take 1 tablet (12.5 mg total) by mouth 2 (two) times daily. 09/24/20   Johnson, Clanford L, MD  ferric citrate (AURYXIA) 1 GM 210 MG(Fe) tablet Take 2 tablets (420 mg total) by mouth 3 (three) times daily with meals. 11/15/18   Kinnie Feil, MD  folic acid (FOLVITE) 1 MG tablet Take 1 mg by mouth every morning.    [provider]  hydrALAZINE (APRESOLINE) 50 MG tablet Take 0.5 tablets (25 mg total) by  mouth every 8 (eight) hours. 06/21/21   Orson Eva, MD  hydrOXYzine (ATARAX/VISTARIL) 25 MG tablet Take 25 mg by mouth every 12 (twelve) hours as needed for itching. 12/27/20   [provider]  insulin aspart (NOVOLOG) 100 UNIT/ML injection Inject 1-3 Units into the skin See admin instructions. Inject per sliding scale  If BS 151-250= 1 unit 251- 350= 2 units 351-400= 3 units 401-500= 3 units Give subcutaneous at bedtime    [provider]  levETIRAcetam (KEPPRA) 500 MG tablet Take 500 mg by mouth at bedtime.    [provider]  levETIRAcetam (KEPPRA) 500 MG tablet Take 1 tablet (500 mg total) by mouth 2 (two) times daily. 07/08/21   Daleen Bo, MD  lidocaine-prilocaine (EMLA) cream Apply 1 application. topically as directed. Apply to left arm fistula every tues, thurs, and sat for hemodialysis    [provider]  Multiple Vitamin (MULTIVITAMIN ADULT PO) Take 1 tablet  by mouth daily.    [provider]  ondansetron (ZOFRAN-ODT) 8 MG disintegrating tablet 14m ODT q4 hours prn nausea 09/23/21   DVeryl Speak MD  oxyCODONE (OXY IR/ROXICODONE) 5 MG immediate release tablet Take 1 tablet (5 mg total) by mouth every 6 (six) hours as needed for severe pain. 06/21/21   TOrson Eva MD  pantoprazole (PROTONIX) 40 MG tablet Take 1 tablet (40 mg total) by mouth daily. 11/19/19 06/19/21  LOswald Hillock MD  traZODone (DESYREL) 150 MG tablet Take 150 mg by mouth at bedtime.    [provider]  vitamin C (ASCORBIC ACID) 500 MG tablet Take 500 mg by mouth 2 (two) times daily.    [provider]  zinc sulfate 220 (50 Zn) MG capsule Take 220 mg by mouth daily.    [provider]    Physical Exam: BP (!) 159/66   Pulse 63   Temp 98.1 F (36.7 C) (Oral)   Resp 11   SpO2 97%   General: 66y.o. year-old female well developed well nourished in no acute distress.   HEENT: NCAT, EOMI Neck: Supple, trachea medial Cardiovascular: Regular rate and  rhythm with no rubs or gallops.  No thyromegaly or JVD noted.  2/4 pulses in all 4 extremities. Respiratory: Clear to auscultation with no wheezes or rales. Good inspiratory effort. Abdomen: Soft, tender to palpation of upper quadrants.  Nondistended with normal bowel sounds x4 quadrants. Muskuloskeletal: No cyanosis or clubbing.  Noted bilateral pedal edema Neuro: Alert but disoriented.  Sensation, reflexes intact Skin: No ulcerative lesions noted or rashes Psychiatry: Mood is appropriate for condition and setting          Labs on Admission:  Basic Metabolic Panel: Recent Labs  Lab 10/12/21 2001  NA 133*  K 5.3*  CL 98  CO2 26  GLUCOSE 170*  BUN 35*  CREATININE 5.73*  CALCIUM 10.3   Liver Function Tests: Recent Labs  Lab 10/12/21 2001  AST 31  ALT 35  ALKPHOS 253*  BILITOT 0.9  PROT 6.4*  ALBUMIN 3.2*   Recent Labs  Lab 10/12/21 2001  LIPASE 19   No results for input(s): "AMMONIA" in the last 168 hours. CBC: Recent Labs  Lab 10/12/21 2001  WBC 5.2  NEUTROABS 3.2  HGB 9.3*  HCT 29.6*  MCV 101.7*  PLT 114*   Cardiac Enzymes: No results for input(s): "CKTOTAL", "CKMB", "CKMBINDEX", "TROPONINI" in the last 168 hours.  BNP (last 3 results) No results for input(s): "BNP" in the last 8760 hours.  ProBNP (last 3 results) No results for input(s): "PROBNP" in the last 8760 hours.  CBG: Recent Labs  Lab 10/12/21 1853  GLUCAP 146*    Radiological Exams on Admission: DG Chest Portable 1 View  Result Date: 10/12/2021 CLINICAL DATA:  Nausea and vomiting. EXAM: PORTABLE CHEST 1 VIEW COMPARISON:  August 24, 2021 FINDINGS: The cardiac silhouette is borderline in size. There is no evidence of acute infiltrate, pleural effusion or pneumothorax. The visualized skeletal structures are unremarkable. IMPRESSION: No active cardiopulmonary disease. Electronically Signed   By: TVirgina NorfolkM.D.   On: 10/12/2021 19:04   CT ABDOMEN PELVIS WO CONTRAST  Result Date:  10/12/2021 CLINICAL DATA:  Nausea and vomiting. EXAM: CT ABDOMEN AND PELVIS WITHOUT CONTRAST TECHNIQUE: Multidetector CT imaging of the abdomen and pelvis was performed following the standard protocol without IV contrast. RADIATION DOSE REDUCTION: This exam was performed according to the departmental dose-optimization program which includes automated exposure control, adjustment  of the mA and/or kV according to patient size and/or use of iterative reconstruction technique. COMPARISON:  CT abdomen and pelvis 08/15/2020. FINDINGS: Lower chest: No acute abnormality.  Heart is enlarged. Hepatobiliary: No focal liver abnormality is seen. Status post cholecystectomy. No biliary dilatation. Pancreas: Pancreas is diffusely atrophic with coarse calcifications compatible with chronic pancreatitis, unchanged. No acute inflammatory changes. Spleen: Normal in size without focal abnormality. Adrenals/Urinary Tract: There is some punctate nonobstructing bilateral renal calculi. Prominent renal vascular calcifications are again seen. There is no hydronephrosis. No ureteral calculi are seen. The adrenal glands are within normal limits. The bladder is under distended and not well evaluated. Stomach/Bowel: There is rectosigmoid wall thickening with mild surrounding inflammation. No dilated bowel loops, pneumatosis or free air. There is questionable wall thickening of the ascending colon. Small bowel is within normal limits. The stomach is decompressed. Gastric wall thickening not excluded. Vascular/Lymphatic: No significant vascular findings are present. No enlarged abdominal or pelvic lymph nodes. Extensive peripheral vascular calcifications are present. Reproductive: Uterus and bilateral adnexa are unremarkable. Other: Small volume ascites.  Body wall edema.  No focal hernia. Musculoskeletal: The bones are osteopenic. Chronic compression deformities of T10, T12 and L2 appear similar to the prior study. Right hip arthroplasty is  similar to the prior study. IMPRESSION: 1. Rectosigmoid wall thickening and inflammation compatible with proctocolitis. 2. Questionable ascending colitis. 3. Questionable gastritis. 4. Small volume ascites and body wall edema. 5. Nonobstructing bilateral renal calculi. 6.  Aortic Atherosclerosis (ICD10-I70.0). Electronically Signed   By: Ronney Asters M.D.   On: 10/12/2021 19:03   CT Head Wo Contrast  Result Date: 10/12/2021 CLINICAL DATA:  Mental status change EXAM: CT HEAD WITHOUT CONTRAST TECHNIQUE: Contiguous axial images were obtained from the base of the skull through the vertex without intravenous contrast. RADIATION DOSE REDUCTION: This exam was performed according to the departmental dose-optimization program which includes automated exposure control, adjustment of the mA and/or kV according to patient size and/or use of iterative reconstruction technique. COMPARISON:  Head CT dated August 21, 2021 FINDINGS: Brain: No evidence of acute infarction, hemorrhage, hydrocephalus, extra-axial collection or mass lesion/mass effect. Vascular: No hyperdense vessel or unexpected calcification. Skull: Normal. Negative for fracture or focal lesion. Sinuses/Orbits: No acute finding. Other: None. IMPRESSION: No acute intracranial abnormality. Electronically Signed   By: Yetta Glassman M.D.   On: 10/12/2021 18:57    EKG: I independently viewed the EKG done and my findings are as followed: Normal sinus rhythm at rate of 66 bpm  Assessment/Plan Present on Admission:  Colitis  Nausea & vomiting  Essential hypertension  Hyperkalemia  Hyponatremia  Thrombocytopenia (HCC)  Principal Problem:   Colitis Active Problems:   Nausea & vomiting   Seizure disorder (HCC)   Essential hypertension   ESRD on dialysis (HCC)   Hyponatremia   Hyperkalemia   Thrombocytopenia (HCC)   Macrocytic anemia   GI bleed   Elevated troponin   GERD (gastroesophageal reflux disease)  Nausea and vomiting in the setting of  presumed colitis/gastritis Continue IV Zosyn 2.25 g q.8h Continue IV morphine 2 mg q.4h p.r.n. for moderate to severe pain Continue IV Protonix 40 mg daily Continue IV Zofran p.r.n. Continue clear liquid diet with plan to advance diet as tolerated Obtain blood culture x2  GI bleed H/H= 9.3/29.6, this was 11.2/35.8 on 09/23/2021 Hemoccult was positive Gastroenterologist  will be consulted in the morning  Hyponatremia Na 133, this will be corrected in dialysis  Hyperkalemia K+ 5.3, no EKG changes Lokelma  was given Further correction in dialysis  ESRD on HD (TTS) Patient missed yesterday's session Continue calcitriol Nephrology will be consulted for possible dialysis today  Chronic thrombocytopenia Platelets 114, continue to monitor platelet levels  Elevated troponin possibly secondary to type II demand ischemia Troponin x 2- 19 > 15, no EKG changes Patient denies any chest pain  Type 2 diabetes with hyperglycemia Continue ISS and hypoglycemia protocol  Essential hypertension Continue Coreg and hydralazine  Mixed hyperlipidemia No antihyperlipidemic medication noted on patient's med rec We shall await updated med rec  Seizure disorder Continue Keppra  GERD Continue Protonix  Macrocytic anemia MCV 101.7 Folate and vitamin B12 levels will be checked Continue folic acid  Order home meds: Ventolin as needed  DVT prophylaxis: Heparin subcu  Code Status: Full code  Consults: Gastroenterology, nephrology  Family Communication: None at bedside  Severity of Illness: The appropriate patient status for this patient is INPATIENT. Inpatient status is judged to be reasonable and necessary in order to provide the required intensity of service to ensure the patient's safety. The patient's presenting symptoms, physical exam findings, and initial radiographic and laboratory data in the context of their chronic comorbidities is felt to place them at high risk for further  clinical deterioration. Furthermore, it is not anticipated that the patient will be medically stable for discharge from the hospital within 2 midnights of admission.   * I certify that at the point of admission it is my clinical judgment that the patient will require inpatient hospital care spanning beyond 2 midnights from the point of admission due to high intensity of service, high risk for further deterioration and high frequency of surveillance required.*  Author: Bernadette Hoit, DO 10/13/2021 2:37 AM  For on call review [email or web address].

## 2021-10-12 NOTE — ED Triage Notes (Signed)
Patient  presents to ED via [facility] with complaints of nausea and vomiting clear fluids that started this afternoon around 1530. Patient is alert and disoriented x 2. Oriented to person only. Patient is not aware of time, place or situation. Son reports she missed dialysis yesterday. C/o  headache, fatigue and  swelling in lower extremities. No acute distress noted.

## 2021-10-13 ENCOUNTER — Encounter (HOSPITAL_COMMUNITY): Payer: Self-pay | Admitting: Internal Medicine

## 2021-10-13 DIAGNOSIS — K219 Gastro-esophageal reflux disease without esophagitis: Secondary | ICD-10-CM

## 2021-10-13 DIAGNOSIS — R1030 Lower abdominal pain, unspecified: Secondary | ICD-10-CM

## 2021-10-13 DIAGNOSIS — R112 Nausea with vomiting, unspecified: Secondary | ICD-10-CM | POA: Diagnosis not present

## 2021-10-13 DIAGNOSIS — D539 Nutritional anemia, unspecified: Secondary | ICD-10-CM

## 2021-10-13 DIAGNOSIS — N186 End stage renal disease: Secondary | ICD-10-CM | POA: Diagnosis not present

## 2021-10-13 DIAGNOSIS — K529 Noninfective gastroenteritis and colitis, unspecified: Secondary | ICD-10-CM | POA: Diagnosis not present

## 2021-10-13 DIAGNOSIS — I10 Essential (primary) hypertension: Secondary | ICD-10-CM | POA: Diagnosis not present

## 2021-10-13 DIAGNOSIS — R778 Other specified abnormalities of plasma proteins: Secondary | ICD-10-CM

## 2021-10-13 DIAGNOSIS — K922 Gastrointestinal hemorrhage, unspecified: Secondary | ICD-10-CM

## 2021-10-13 LAB — GLUCOSE, CAPILLARY
Glucose-Capillary: 126 mg/dL — ABNORMAL HIGH (ref 70–99)
Glucose-Capillary: 150 mg/dL — ABNORMAL HIGH (ref 70–99)
Glucose-Capillary: 220 mg/dL — ABNORMAL HIGH (ref 70–99)

## 2021-10-13 LAB — IRON AND TIBC
Iron: 39 ug/dL (ref 28–170)
Saturation Ratios: 30 % (ref 10.4–31.8)
TIBC: 131 ug/dL — ABNORMAL LOW (ref 250–450)
UIBC: 92 ug/dL

## 2021-10-13 LAB — CBC
HCT: 27.2 % — ABNORMAL LOW (ref 36.0–46.0)
Hemoglobin: 8.4 g/dL — ABNORMAL LOW (ref 12.0–15.0)
MCH: 31.5 pg (ref 26.0–34.0)
MCHC: 30.9 g/dL (ref 30.0–36.0)
MCV: 101.9 fL — ABNORMAL HIGH (ref 80.0–100.0)
Platelets: 113 10*3/uL — ABNORMAL LOW (ref 150–400)
RBC: 2.67 MIL/uL — ABNORMAL LOW (ref 3.87–5.11)
RDW: 13.9 % (ref 11.5–15.5)
WBC: 4.2 10*3/uL (ref 4.0–10.5)
nRBC: 0 % (ref 0.0–0.2)

## 2021-10-13 LAB — COMPREHENSIVE METABOLIC PANEL
ALT: 29 U/L (ref 0–44)
AST: 26 U/L (ref 15–41)
Albumin: 2.6 g/dL — ABNORMAL LOW (ref 3.5–5.0)
Alkaline Phosphatase: 215 U/L — ABNORMAL HIGH (ref 38–126)
Anion gap: 7 (ref 5–15)
BUN: 38 mg/dL — ABNORMAL HIGH (ref 8–23)
CO2: 27 mmol/L (ref 22–32)
Calcium: 9.7 mg/dL (ref 8.9–10.3)
Chloride: 101 mmol/L (ref 98–111)
Creatinine, Ser: 6.18 mg/dL — ABNORMAL HIGH (ref 0.44–1.00)
GFR, Estimated: 7 mL/min — ABNORMAL LOW (ref >60)
Glucose, Bld: 136 mg/dL — ABNORMAL HIGH (ref 70–99)
Potassium: 5.2 mmol/L — ABNORMAL HIGH (ref 3.5–5.1)
Sodium: 135 mmol/L (ref 135–145)
Total Bilirubin: 0.9 mg/dL (ref 0.3–1.2)
Total Protein: 5.3 g/dL — ABNORMAL LOW (ref 6.5–8.1)

## 2021-10-13 LAB — CBG MONITORING, ED: Glucose-Capillary: 138 mg/dL — ABNORMAL HIGH (ref 70–99)

## 2021-10-13 LAB — PHOSPHORUS: Phosphorus: 1.5 mg/dL — ABNORMAL LOW (ref 2.5–4.6)

## 2021-10-13 LAB — FOLATE: Folate: >40 ng/mL (ref >5.9)

## 2021-10-13 LAB — VITAMIN B12: Vitamin B-12: 1598 pg/mL — ABNORMAL HIGH (ref 180–914)

## 2021-10-13 LAB — APTT: aPTT: 27 seconds (ref 24–36)

## 2021-10-13 LAB — MAGNESIUM: Magnesium: 2.6 mg/dL — ABNORMAL HIGH (ref 1.7–2.4)

## 2021-10-13 LAB — FERRITIN: Ferritin: 1582 ng/mL — ABNORMAL HIGH (ref 11–307)

## 2021-10-13 MED ORDER — PNEUMOCOCCAL 20-VAL CONJ VACC 0.5 ML IM SUSY
0.5000 mL | PREFILLED_SYRINGE | INTRAMUSCULAR | Status: DC | PRN
  Filled 2021-10-13: qty 0.5

## 2021-10-13 MED ORDER — BOOST / RESOURCE BREEZE PO LIQD CUSTOM
1.0000 | Freq: Three times a day (TID) | ORAL | Status: DC
  Administered 2021-10-13 – 2021-10-19 (×17): 1 via ORAL

## 2021-10-13 MED ORDER — CALCITRIOL 0.25 MCG PO CAPS
0.2500 ug | ORAL_CAPSULE | ORAL | Status: DC
  Administered 2021-10-18: 0.25 ug via ORAL
  Filled 2021-10-13: qty 1

## 2021-10-13 MED ORDER — LEVETIRACETAM 500 MG PO TABS
500.0000 mg | ORAL_TABLET | Freq: Every day | ORAL | Status: DC
  Administered 2021-10-14 – 2021-10-18 (×5): 500 mg via ORAL
  Filled 2021-10-13 (×5): qty 1

## 2021-10-13 MED ORDER — SODIUM ZIRCONIUM CYCLOSILICATE 10 G PO PACK
10.0000 g | PACK | Freq: Two times a day (BID) | ORAL | Status: AC
Start: 2021-10-13 — End: 2021-10-13
  Administered 2021-10-13 (×2): 10 g via ORAL
  Filled 2021-10-13: qty 1
  Filled 2021-10-13: qty 2

## 2021-10-13 MED ORDER — ACETAMINOPHEN 325 MG PO TABS: 650.0000 mg | ORAL_TABLET | Freq: Four times a day (QID) | ORAL | Status: DC | PRN

## 2021-10-13 MED ORDER — ALBUTEROL SULFATE (2.5 MG/3ML) 0.083% IN NEBU
2.5000 mg | INHALATION_SOLUTION | RESPIRATORY_TRACT | Status: DC | PRN
Start: 2021-10-13 — End: 2021-10-19

## 2021-10-13 MED ORDER — LEVETIRACETAM 500 MG PO TABS
500.0000 mg | ORAL_TABLET | Freq: Two times a day (BID) | ORAL | Status: DC
  Administered 2021-10-13: 500 mg via ORAL
  Filled 2021-10-13: qty 1

## 2021-10-13 MED ORDER — NICOTINE 14 MG/24HR TD PT24
14.0000 mg | MEDICATED_PATCH | Freq: Every day | TRANSDERMAL | Status: DC
  Administered 2021-10-13 – 2021-10-19 (×6): 14 mg via TRANSDERMAL
  Filled 2021-10-13 (×7): qty 1

## 2021-10-13 MED ORDER — INSULIN ASPART 100 UNIT/ML IJ SOLN
0.0000 [IU] | Freq: Every day | INTRAMUSCULAR | Status: DC
  Administered 2021-10-13: 2 [IU] via SUBCUTANEOUS
  Administered 2021-10-18: 4 [IU] via SUBCUTANEOUS

## 2021-10-13 MED ORDER — ONDANSETRON HCL 4 MG/2ML IJ SOLN
4.0000 mg | Freq: Four times a day (QID) | INTRAMUSCULAR | Status: DC | PRN
  Administered 2021-10-13: 4 mg via INTRAVENOUS
  Filled 2021-10-13: qty 2

## 2021-10-13 MED ORDER — PANTOPRAZOLE SODIUM 40 MG IV SOLR
40.0000 mg | INTRAVENOUS | Status: DC
Start: 2021-10-13 — End: 2021-10-13

## 2021-10-13 MED ORDER — ALBUTEROL SULFATE HFA 108 (90 BASE) MCG/ACT IN AERS: 2.0000 | INHALATION_SPRAY | RESPIRATORY_TRACT | Status: DC | PRN

## 2021-10-13 MED ORDER — ONDANSETRON HCL 4 MG PO TABS: 4.0000 mg | ORAL_TABLET | Freq: Four times a day (QID) | ORAL | Status: DC | PRN

## 2021-10-13 MED ORDER — INSULIN ASPART 100 UNIT/ML IJ SOLN
0.0000 [IU] | Freq: Three times a day (TID) | INTRAMUSCULAR | Status: DC
  Administered 2021-10-14 – 2021-10-15 (×2): 1 [IU] via SUBCUTANEOUS
  Administered 2021-10-15: 3 [IU] via SUBCUTANEOUS
  Administered 2021-10-15 – 2021-10-19 (×7): 1 [IU] via SUBCUTANEOUS

## 2021-10-13 MED ORDER — MORPHINE SULFATE (PF) 2 MG/ML IV SOLN: 2.0000 mg | INTRAVENOUS | Status: DC | PRN

## 2021-10-13 MED ORDER — ACETAMINOPHEN 650 MG RE SUPP: 650.0000 mg | Freq: Four times a day (QID) | RECTAL | Status: DC | PRN

## 2021-10-13 MED ORDER — PANTOPRAZOLE SODIUM 40 MG IV SOLR
40.0000 mg | Freq: Two times a day (BID) | INTRAVENOUS | Status: DC
  Administered 2021-10-13 – 2021-10-19 (×13): 40 mg via INTRAVENOUS
  Filled 2021-10-13 (×14): qty 10

## 2021-10-13 MED ORDER — HYDRALAZINE HCL 25 MG PO TABS
25.0000 mg | ORAL_TABLET | Freq: Three times a day (TID) | ORAL | Status: DC
  Administered 2021-10-13 – 2021-10-14 (×4): 25 mg via ORAL
  Filled 2021-10-13 (×4): qty 1

## 2021-10-13 MED ORDER — FOLIC ACID 1 MG PO TABS
1.0000 mg | ORAL_TABLET | Freq: Every morning | ORAL | Status: DC
  Filled 2021-10-13: qty 1

## 2021-10-13 MED ORDER — HEPARIN SODIUM (PORCINE) 5000 UNIT/ML IJ SOLN
5000.0000 [IU] | Freq: Three times a day (TID) | INTRAMUSCULAR | Status: DC
  Administered 2021-10-13: 5000 [IU] via SUBCUTANEOUS
  Filled 2021-10-13: qty 1

## 2021-10-13 MED ORDER — SODIUM CHLORIDE 0.9 % IV SOLN
2.2500 g | Freq: Three times a day (TID) | INTRAVENOUS | Status: DC
  Administered 2021-10-13 – 2021-10-19 (×18): 2.25 g via INTRAVENOUS
  Filled 2021-10-13: qty 10
  Filled 2021-10-13: qty 2.25
  Filled 2021-10-13 (×21): qty 10

## 2021-10-13 MED ORDER — CARVEDILOL 12.5 MG PO TABS
12.5000 mg | ORAL_TABLET | Freq: Two times a day (BID) | ORAL | Status: DC
  Administered 2021-10-13 – 2021-10-17 (×8): 12.5 mg via ORAL
  Filled 2021-10-13 (×9): qty 1

## 2021-10-13 NOTE — Plan of Care (Signed)
  Problem: Acute Rehab OT Goals (only OT should resolve) Goal: Pt. Will Perform Grooming Flowsheets (Taken 10/13/2021 1537) Pt Will Perform Grooming:  with min guard assist  sitting Goal: Pt. Will Perform Upper Body Bathing Flowsheets (Taken 10/13/2021 1537) Pt Will Perform Upper Body Bathing:  with min guard assist  sitting Goal: Pt. Will Perform Upper Body Dressing Flowsheets (Taken 10/13/2021 1537) Pt Will Perform Upper Body Dressing:  with min guard assist  sitting Goal: Pt. Will Perform Lower Body Dressing Flowsheets (Taken 10/13/2021 1537) Pt Will Perform Lower Body Dressing:  with mod assist  sitting/lateral leans Goal: Pt. Will Transfer To Toilet Flowsheets (Taken 10/13/2021 1537) Pt Will Transfer to Toilet:  with min guard assist  squat pivot transfer Goal: Pt/Caregiver Will Perform Home Exercise Program Flowsheets (Taken 10/13/2021 1537) Pt/caregiver will Perform Home Exercise Program:  Increased strength  Both right and left upper extremity  Independently  Raymie Trani OT, MOT

## 2021-10-13 NOTE — Progress Notes (Signed)
PROGRESS NOTE    Erica [name]  DPO:[id number] DOB: 04-29-1955 DOA: 10/12/2021 PCP: System, Provider Not In    Chief Complaint  Patient presents with   Emesis   Headache   Fatigue    Brief Narrative:  Erica [name] is a 66 y.o. female with medical history significant of T2DM, hypertension, hyperlipidemia, ESRD on HD (TTS) seizure disorder, CAD who presents to the emergency department via EMS due to nausea and vomiting which started  the day before the admission.  Patient has chronic confusion, patient missed her dialysis  on Saturday, due to not feeling well. On arrival to ED, she was found be anemic.  CT abdomen and pelvis without contrast showed rectosigmoid wall thickening and inflammation compatible with proctocolitis with questionable ascending colitis and questionable gastritis.  Small volume ascites and body wall edema and nonobstructing bilateral renal calculi also noted. CT head without contrast showed no acute intracranial abnormality Patient was treated with IV fentanyl and, Zofran was given, Lokelma was given due to hyperkalemia and she was empirically started on Zosyn due to presumed colitis. Hospitalist was asked to admit patient for further evaluation and management.  Assessment & Plan:   Principal Problem:   Colitis Active Problems:   Nausea & vomiting   Seizure disorder (HCC)   Essential hypertension   ESRD on dialysis (HCC)   Hyponatremia   Hyperkalemia   Thrombocytopenia (HCC)   Macrocytic anemia   GI bleed   Elevated troponin   GERD (gastroesophageal reflux disease)   Nausea and vomiting, intermittent diarrhea probably from colitis and possible gastritis.  Symptomatic  management with IV PPI, IV pain control, IV zofran.  Empirically on IV zosyn for colitis.  Started on clears and GI consulted to see if she needs colonoscopy.  Blood cultures done and pending.    Acute anemia of blood loss/  Suspect GI blood loss as her stool for occult  blood is positive.  GI consulted .  Transfuse to keep hemoglobin greater than 7. Baseline hemoglobin between 9 to 10.  Hemoglobin 8.4 this morning.  Get anemia panel.    Hyperkalemia:  From ESRD and missing HD. No EKG changes, no chest pain.  On Lokelma.    ESRD on TTHS Missed sat HD due to not feeling well.  Nephrology on board.  Plan for HD today.    Mild Elevated troponins from type II demand ischemia EKG doe snot show any ischemic changes.  Pt denies any chest pain.    Seizure disorder  Resume Keppra.    GErd:  Resume PPI.    Macrocytic anemia:  Get anemia panel.    Type 2 MD,  Controlled CBG's CBG (last 3)  Recent Labs    10/12/21 1853 10/13/21 0759  GLUCAP 146* 138*   Resume SSI.  Last A1c is 6.4%. non insulin dependent.   Mild thrombocytopenia:  Monitor.    DVT prophylaxis: SCD'S Code Status: full code.  Family Communication: (none at bedside).  Disposition:   Status is: Inpatient Remains inpatient appropriate because: HD,    Level of care: Med-Surg Consultants:  Gastroenterology Nephrology.  Procedures: HD  Antimicrobials:none.    Subjective: Nauseated, no vomiting today or since admission.  No diarrhea today.  Feeling sob.   Objective: Vitals:   10/13/21 0430 10/13/21 0500 10/13/21 0804 10/13/21 0842  BP: (!) 144/63 (!) 165/66 (!) 157/55   Pulse: 65 64 70   Resp: 13 (!) 8 17   Temp: 97.8 F (36.6 C)   97.6  F (36.4 C)  TempSrc: Oral   Oral  SpO2: 99% 98% 98%    No intake or output data in the 24 hours ending 10/13/21 0942 There were no vitals filed for this visit.  Examination:  General exam: Cachetic looking lady , no distress noted.  Respiratory system: Clear to auscultation. Respiratory effort normal. Cardiovascular system: S1 & S2 heard, RRR. No JVD,No pedal edema. Gastrointestinal system: Abdomen is nondistended, soft and nontender.  Normal bowel sounds heard. Central nervous system: Alert and oriented. No  focal neurological deficits. Extremities: Symmetric 5 x 5 power. Skin: No rashes, lesions or ulcers Psychiatry: Mood & affect appropriate.     Data Reviewed: I have personally reviewed following labs and imaging studies  CBC: Recent Labs  Lab 10/12/21 2001 10/13/21 0517  WBC 5.2 4.2  NEUTROABS 3.2  --   HGB 9.3* 8.4*  HCT 29.6* 27.2*  MCV 101.7* 101.9*  PLT 114* 113*    Basic Metabolic Panel: Recent Labs  Lab 10/12/21 2001 10/13/21 0517  NA 133* 135  K 5.3* 5.2*  CL 98 101  CO2 26 27  GLUCOSE 170* 136*  BUN 35* 38*  CREATININE 5.73* 6.18*  CALCIUM 10.3 9.7  MG  --  2.6*  PHOS  --  1.5*    GFR: CrCl cannot be calculated (Unknown ideal weight.).  Liver Function Tests: Recent Labs  Lab 10/12/21 2001 10/13/21 0517  AST 31 26  ALT 35 29  ALKPHOS 253* 215*  BILITOT 0.9 0.9  PROT 6.4* 5.3*  ALBUMIN 3.2* 2.6*    CBG: Recent Labs  Lab 10/12/21 1853 10/13/21 0759  GLUCAP 146* 138*     No results found for this or any previous visit (from the past 240 hour(s)).       Radiology Studies: DG Chest Portable 1 View  Result Date: 10/12/2021 CLINICAL DATA:  Nausea and vomiting. EXAM: PORTABLE CHEST 1 VIEW COMPARISON:  August 24, 2021 FINDINGS: The cardiac silhouette is borderline in size. There is no evidence of acute infiltrate, pleural effusion or pneumothorax. The visualized skeletal structures are unremarkable. IMPRESSION: No active cardiopulmonary disease. Electronically Signed   By: Virgina Norfolk M.D.   On: 10/12/2021 19:04   CT ABDOMEN PELVIS WO CONTRAST  Result Date: 10/12/2021 CLINICAL DATA:  Nausea and vomiting. EXAM: CT ABDOMEN AND PELVIS WITHOUT CONTRAST TECHNIQUE: Multidetector CT imaging of the abdomen and pelvis was performed following the standard protocol without IV contrast. RADIATION DOSE REDUCTION: This exam was performed according to the departmental dose-optimization program which includes automated exposure control, adjustment of the  mA and/or kV according to patient size and/or use of iterative reconstruction technique. COMPARISON:  CT abdomen and pelvis 08/15/2020. FINDINGS: Lower chest: No acute abnormality.  Heart is enlarged. Hepatobiliary: No focal liver abnormality is seen. Status post cholecystectomy. No biliary dilatation. Pancreas: Pancreas is diffusely atrophic with coarse calcifications compatible with chronic pancreatitis, unchanged. No acute inflammatory changes. Spleen: Normal in size without focal abnormality. Adrenals/Urinary Tract: There is some punctate nonobstructing bilateral renal calculi. Prominent renal vascular calcifications are again seen. There is no hydronephrosis. No ureteral calculi are seen. The adrenal glands are within normal limits. The bladder is under distended and not well evaluated. Stomach/Bowel: There is rectosigmoid wall thickening with mild surrounding inflammation. No dilated bowel loops, pneumatosis or free air. There is questionable wall thickening of the ascending colon. Small bowel is within normal limits. The stomach is decompressed. Gastric wall thickening not excluded. Vascular/Lymphatic: No significant vascular findings are present. No  enlarged abdominal or pelvic lymph nodes. Extensive peripheral vascular calcifications are present. Reproductive: Uterus and bilateral adnexa are unremarkable. Other: Small volume ascites.  Body wall edema.  No focal hernia. Musculoskeletal: The bones are osteopenic. Chronic compression deformities of T10, T12 and L2 appear similar to the prior study. Right hip arthroplasty is similar to the prior study. IMPRESSION: 1. Rectosigmoid wall thickening and inflammation compatible with proctocolitis. 2. Questionable ascending colitis. 3. Questionable gastritis. 4. Small volume ascites and body wall edema. 5. Nonobstructing bilateral renal calculi. 6.  Aortic Atherosclerosis (ICD10-I70.0). Electronically Signed   By: Ronney Asters M.D.   On: 10/12/2021 19:03   CT Head  Wo Contrast  Result Date: 10/12/2021 CLINICAL DATA:  Mental status change EXAM: CT HEAD WITHOUT CONTRAST TECHNIQUE: Contiguous axial images were obtained from the base of the skull through the vertex without intravenous contrast. RADIATION DOSE REDUCTION: This exam was performed according to the departmental dose-optimization program which includes automated exposure control, adjustment of the mA and/or kV according to patient size and/or use of iterative reconstruction technique. COMPARISON:  Head CT dated August 21, 2021 FINDINGS: Brain: No evidence of acute infarction, hemorrhage, hydrocephalus, extra-axial collection or mass lesion/mass effect. Vascular: No hyperdense vessel or unexpected calcification. Skull: Normal. Negative for fracture or focal lesion. Sinuses/Orbits: No acute finding. Other: None. IMPRESSION: No acute intracranial abnormality. Electronically Signed   By: Yetta Glassman M.D.   On: 10/12/2021 18:57        Scheduled Meds:  [START ON 10/14/2021] calcitRIOL  0.25 mcg Oral Q T,Th,Sa-HD   carvedilol  12.5 mg Oral BID   folic acid  1 mg Oral q morning   heparin  5,000 Units Subcutaneous Q8H   hydrALAZINE  25 mg Oral Q8H   insulin aspart  0-5 Units Subcutaneous QHS   insulin aspart  0-6 Units Subcutaneous TID WC   levETIRAcetam  500 mg Oral BID   nicotine  14 mg Transdermal Daily   pantoprazole (PROTONIX) IV  40 mg Intravenous Q12H   sodium zirconium cyclosilicate  10 g Oral BID   Continuous Infusions:  piperacillin-tazobactam (ZOSYN)  IV 2.25 g (10/13/21 0851)     LOS: 1 day    Time spent: 38 minutes     Hosie Poisson, MD [facility]   To contact the attending provider between 7A-7P or the covering provider during after hours 7P-7A, please log into the web site [email or web address] and access using universal [place] password for that web site. If you do not have the password, please call the hospital operator.  10/13/2021, 9:42 AM

## 2021-10-13 NOTE — ED Notes (Signed)
Pt given word search book, per request

## 2021-10-13 NOTE — Evaluation (Signed)
Physical Therapy Evaluation Patient Details Name: Erica [name] MRN: [id number] DOB: 1956/01/19 Today's Date: 10/13/2021  History of Present Illness  Erica [name] is a 66 y.o. female with medical history significant of T2DM, hypertension, hyperlipidemia, ESRD on HD (TTS) seizure disorder, CAD who presents to the emergency department via EMS due to nausea and vomiting which started yesterday, she vomited again this morning after breakfast.  Vomitus was nonbloody and she denies abdominal pain.  Patient has chronic confusion, patient missed her dialysis yesterday due to not feeling well.  She denies chest pain, shortness of breath, fever, chills, diarrhea.   Clinical Impression  Patient limited for functional mobility as stated below secondary to BLE weakness, fatigue and poor standing balance. Patient requires min assist to pull to seated EOB. She demonstrates good sitting balance and fair sitting tolerance. She requires mod assist and use of RW to transfer to standing and fatigues quickly with standing. She is assisted back to bed at end of session. Patient will benefit from continued physical therapy in hospital and recommended venue below to increase strength, balance, endurance for safe ADLs and gait.        Recommendations for follow up therapy are one component of a multi-disciplinary discharge planning process, led by the attending physician.  Recommendations may be updated based on patient status, additional functional criteria and insurance authorization.  Follow Up Recommendations Skilled nursing-short term rehab (<3 hours/day) Can patient physically be transported by private vehicle: No    Assistance Recommended at Discharge Frequent or constant Supervision/Assistance  Patient can return home with the following  A lot of help with bathing/dressing/bathroom;A lot of help with walking and/or transfers;Assistance with cooking/housework;Assist for transportation    Equipment  Recommendations None recommended by PT  Recommendations for Other Services       Functional Status Assessment Patient has had a recent decline in their functional status and demonstrates the ability to make significant improvements in function in a reasonable and predictable amount of time.     Precautions / Restrictions Precautions Precautions: Fall Restrictions Weight Bearing Restrictions: No      Mobility  Bed Mobility Overal bed mobility: Needs Assistance Bed Mobility: Supine to Sit, Sit to Supine     Supine to sit: Min assist, HOB elevated Sit to supine: Mod assist   General bed mobility comments: assist to pull to seated EOB and assist for BLE back into bed    Transfers Overall transfer level: Needs assistance Equipment used: Rolling walker (2 wheels) Transfers: Sit to/from Stand Sit to Stand: Mod assist, From elevated surface           General transfer comment: labored, quick fatigue with standing    Ambulation/Gait                  Stairs            Wheelchair Mobility    Modified Rankin (Stroke Patients Only)       Balance Overall balance assessment: Needs assistance Sitting-balance support: Bilateral upper extremity supported, Feet unsupported Sitting balance-Leahy Scale: Good Sitting balance - Comments: seated EOB   Standing balance support: Bilateral upper extremity supported, Reliant on assistive device for balance Standing balance-Leahy Scale: Poor                               Pertinent Vitals/Pain Pain Assessment Pain Assessment: No/denies pain    Home Living Family/patient expects to be discharged to::  Private residence Living Arrangements: Children Available Help at Discharge: Family;Available PRN/intermittently Type of Home: Apartment Home Access: Level entry       Home Layout: One level Home Equipment: Conservation officer, nature (2 wheels);BSC/3in1;Wheelchair - manual      Prior Function Prior Level of  Function : Needs assist             Mobility Comments: [place]ous[place] ambulator with RW and assist from son, mostly uses wheel chair for mobility ADLs Comments: Pt reports assist for all ADL's but grooming and eating. Pt assisted by family for IADL's as well.     Hand Dominance        Extremity/Trunk Assessment   Upper Extremity Assessment Upper Extremity Assessment: Defer to OT evaluation    Lower Extremity Assessment Lower Extremity Assessment: Generalized weakness       Communication   Communication: No difficulties  Cognition Arousal/Alertness: Awake/alert Behavior During Therapy: WFL for tasks assessed/performed Overall Cognitive Status: Within Functional Limits for tasks assessed                                          General Comments      Exercises     Assessment/Plan    PT Assessment Patient needs continued PT services  PT Problem List Decreased strength;Decreased mobility;Decreased activity tolerance;Decreased balance       PT Treatment Interventions Therapeutic exercise;DME instruction;Gait training;Balance training;Stair training;Neuromuscular re-education;Functional mobility training;Therapeutic activities;Patient/family education    PT Goals (Current goals can be found in the Care Plan section)  Acute Rehab PT Goals Patient Stated Goal: get stronger PT Goal Formulation: With patient Time For Goal Achievement: 10/27/21 Potential to Achieve Goals: Fair    Frequency Min 3X/week     Co-evaluation               AM-PAC PT "6 Clicks" Mobility  Outcome Measure Help needed turning from your back to your side while in a flat bed without using bedrails?: None Help needed moving from lying on your back to sitting on the side of a flat bed without using bedrails?: A Little Help needed moving to and from a bed to a chair (including a wheelchair)?: A Lot Help needed standing up from a chair using your arms (e.g., wheelchair or  bedside chair)?: A Lot Help needed to walk in hospital room?: A Lot Help needed climbing 3-5 steps with a railing? : Total 6 Click Score: 14    End of Session Equipment Utilized During Treatment: Gait belt Activity Tolerance: Patient limited by fatigue Patient left: in bed;with call bell/phone within reach;with bed alarm set Nurse Communication: Mobility status PT Visit Diagnosis: Unsteadiness on feet (R26.81);Other abnormalities of gait and mobility (R26.89);Muscle weakness (generalized) (M62.81)    Time: 1315-1330 PT Time Calculation (min) (ACUTE ONLY): 15 min   Charges:   PT Evaluation $PT Eval Low Complexity: 1 Low PT Treatments $Therapeutic Activity: 8-22 mins        2:10 PM, 10/13/21 Mearl Latin PT, DPT Physical Therapist at [facility]

## 2021-10-13 NOTE — Plan of Care (Signed)
  Problem: Acute Rehab PT Goals(only PT should resolve) Goal: Pt Will Go Supine/Side To Sit Outcome: Progressing Flowsheets (Taken 10/13/2021 1412) Pt will go Supine/Side to Sit: with min guard assist Goal: Pt Will Go Sit To Supine/Side Outcome: Progressing Flowsheets (Taken 10/13/2021 1412) Pt will go Sit to Supine/Side: with min guard assist Goal: Patient Will Transfer Sit To/From Stand Outcome: Progressing Flowsheets (Taken 10/13/2021 1412) Patient will transfer sit to/from stand:  with min guard assist  with minimal assist Goal: Pt Will Transfer Bed To Chair/Chair To Bed Outcome: Progressing Flowsheets (Taken 10/13/2021 1412) Pt will Transfer Bed to Chair/Chair to Bed:  min guard assist  with min assist Goal: Pt/caregiver will Perform Home Exercise Program Outcome: Progressing Flowsheets (Taken 10/13/2021 1412) Pt/caregiver will Perform Home Exercise Program:  For increased strengthening  For improved balance  Independently  2:12 PM, 10/13/21 Mearl Latin PT, DPT Physical Therapist at [facility]

## 2021-10-13 NOTE — Consult Note (Signed)
Gastroenterology Consult   Referring Provider: Dr. Bernadette Hoit Primary Care Physician:  System, Provider Not In Primary Gastroenterologist:  Dr. Abbey Chatters  Patient ID: Erica [name]; [id number]; 09-11-1955   Admit date: 10/12/2021  LOS: 1 day   Date of Consultation: 10/13/2021  Reason for Consultation:  Heme positive stool, anemia, colitis   History of Present Illness   Erica [name] is a 66 y.o. year old female with history of DM, HTN, hyperlipidemia, ESRD on dialysis, history of chronic pancreatitis, seizures, CAD, presenting to the ED yesterday with acute N/V, abdominal pain, and diarrhea. CT abd/pelvis without contrast showed proctocolitis, questionable ascending colitis, possible gastritis. Hgb 9.3 on admission, down to 8.4 this morning. Previously 11.2 several weeks ago. Heme positive stool. Iron studies in process.  Patient is a difficult historian. Son lives with her. Notes N/V for past 3 days, intermittent abdominal pain. Associated diarrhea. Last BM yesterday.  No overt GI bleeding. No pain currently. Pain located upper abdomen. No prior colonoscopy/EGD. No sick contacts. Poor appetite. No taste. Notes weight loss. Sometimes solid food dysphagia. Intermittent GERD.   Last colonoscopy by Dr. Laural Golden in 2012 with fair prep, limited exam. Two small polyps removed. Tubular adenoma.   Dialysis Tues/Thurs/Saturday.   Dec 2022 Weight: 45.5 kg Today: 42.4 kg.      Past Medical History:  Diagnosis Date   Alcohol-induced pancreatitis    CAD (coronary artery disease) 09/17/2020   Chronic diarrhea    Depression    Diabetes mellitus    fasting blood sugar 110-120s   Dialysis patient ([place])    Diastolic CHF ([place])    DKA (diabetic ketoacidoses)    Gastroparesis    GERD (gastroesophageal reflux disease)    Heart murmur    History of kidney stones    Hyperlipidemia    Hypertension    Hypokalemia    Muscle spasm    Neuropathic pain    Neuropathy    Hx: of    Pyelonephritis    Seizures ([place])    Vitamin B12 deficiency    Vitamin D deficiency     Past Surgical History:  Procedure Laterality Date   AV FISTULA PLACEMENT Left 12/15/2017   Procedure: INSERTION OF ARTERIOVENOUS (AV) GORE-TEX GRAFT ARM;  Surgeon: Rosetta Posner, MD;  Location: [facility];  Service: Vascular;  Laterality: Left;   CATARACT EXTRACTION W/PHACO Right 03/14/2015   Procedure: CATARACT EXTRACTION PHACO AND INTRAOCULAR LENS PLACEMENT ([place]);  Surgeon: Baruch Goldmann, MD;  Location: [facility];  Service: Ophthalmology;  Laterality: Right;  CDE:11.13   CATARACT EXTRACTION W/PHACO Left 04/11/2015   Procedure: CATARACT EXTRACTION PHACO AND INTRAOCULAR LENS PLACEMENT LEFT EYE CDE=9.68;  Surgeon: Baruch Goldmann, MD;  Location: [facility];  Service: Ophthalmology;  Laterality: Left;   COLONOSCOPY  02/24/2010   cystoscopy with ureteral stent  Bilateral 10/07/2017   At [place] CV LINE RIGHT  11/17/2017   IR FLUORO GUIDE CV LINE RIGHT  11/22/2017   IR REMOVAL TUN CV CATH W/O FL  01/26/2018   IR US GUIDE VASC ACCESS RIGHT  11/17/2017   MULTIPLE EXTRACTIONS WITH ALVEOLOPLASTY N/A 06/13/2012   Procedure: MULTIPLE EXTRACION WITH ALVEOLOPLASTY EXTRACT: 18, 19, 20, 21, 22, 24, 25, 27, 28, 29, 30, 31;  Surgeon: Gae Bon, DDS;  Location: [facility];  Service: Oral Surgery;  Laterality: N/A;   ORIF HUMERUS FRACTURE Right 04/20/2019   Procedure: OPEN REDUCTION INTERNAL FIXATION (ORIF) PROXIMAL HUMERUS FRACTURE;  Surgeon: Marchia Bond, MD;  Location: [facility];  Service: Orthopedics;  Laterality: Right;   TUBAL LIGATION     URETERAL STENT PLACEMENT  09/2017    Prior to Admission medications   Medication Sig Start Date End Date Taking? Authorizing Provider  albuterol (VENTOLIN HFA) 108 (90 Base) MCG/ACT inhaler Inhale 2 puffs into the lungs every 4 (four) hours as needed for wheezing or shortness of breath.    Yes [provider]  Biotin 10 MG TABS Take 10 mg by mouth daily.   Yes  [provider]  calcitRIOL (ROCALTROL) 0.25 MCG capsule Take 1 capsule (0.25 mcg total) by mouth Every Tuesday,Thursday,and Saturday with dialysis. 12/18/17  Yes Love, Ivan Anchors, PA-C  carvedilol (COREG) 12.5 MG tablet Take 1 tablet (12.5 mg total) by mouth 2 (two) times daily. 09/24/20  Yes Johnson, Clanford L, MD  ferric citrate (AURYXIA) 1 GM 210 MG(Fe) tablet Take 2 tablets (420 mg total) by mouth 3 (three) times daily with meals. 11/15/18  Yes Buriev, Arie Sabina, MD  folic acid (FOLVITE) 1 MG tablet Take 1 mg by mouth every morning.   Yes [provider]  hydrALAZINE (APRESOLINE) 50 MG tablet Take 0.5 tablets (25 mg total) by mouth every 8 (eight) hours. 06/21/21  Yes Tat, Shanon Brow, MD  insulin aspart (NOVOLOG) 100 UNIT/ML injection Inject 1-3 Units into the skin See admin instructions. Inject per sliding scale  If BS 151-250= 1 unit 251- 350= 2 units 351-400= 3 units 401-500= 3 units Give subcutaneous at bedtime   Yes [provider]  levETIRAcetam (KEPPRA) 500 MG tablet Take 1 tablet (500 mg total) by mouth 2 (two) times daily. Patient taking differently: Take 500 mg by mouth daily. 07/08/21  Yes Daleen Bo, MD  Multiple Vitamin (MULTIVITAMIN ADULT PO) Take 1 tablet by mouth daily.   Yes [provider]  ondansetron (ZOFRAN-ODT) 8 MG disintegrating tablet 27m ODT q4 hours prn nausea Patient taking differently: Take 4 mg by mouth every 8 (eight) hours as needed for nausea or vomiting. 09/23/21  Yes Delo, DNathaneil Canary MD  oxyCODONE (OXY IR/ROXICODONE) 5 MG immediate release tablet Take 1 tablet (5 mg total) by mouth every 6 (six) hours as needed for severe pain. 06/21/21  Yes Tat, DShanon Brow MD  pantoprazole (PROTONIX) 40 MG tablet Take 1 tablet (40 mg total) by mouth daily. 11/19/19 10/13/21 Yes LOswald Hillock MD  traZODone (DESYREL) 150 MG tablet Take 150 mg by mouth at bedtime.   Yes [provider]  vitamin C (ASCORBIC ACID) 500 MG tablet Take 500 mg by mouth 2  (two) times daily.   Yes [provider]  zinc sulfate 220 (50 Zn) MG capsule Take 220 mg by mouth daily.   Yes [provider]  lidocaine-prilocaine (EMLA) cream Apply 1 application. topically as directed. Apply to left arm fistula every tues, thurs, and sat for hemodialysis    [provider]    Current Facility-Administered Medications  Medication Dose Route Frequency Provider Last Rate Last Admin   acetaminophen (TYLENOL) tablet 650 mg  650 mg Oral Q6H PRN Adefeso, Oladapo, DO       Or   acetaminophen (TYLENOL) suppository 650 mg  650 mg Rectal Q6H PRN Adefeso, Oladapo, DO       albuterol (PROVENTIL) (2.5 MG/3ML) 0.083% nebulizer solution 2.5 mg  2.5 mg Nebulization Q4H PRN AHosie Poisson MD       [START ON 10/14/2021] calcitRIOL (ROCALTROL) capsule 0.25 mcg  0.25 mcg Oral Q T,Th,Sa-HD Adefeso, Oladapo, DO  carvedilol (COREG) tablet 12.5 mg  12.5 mg Oral BID Adefeso, Oladapo, DO   12.5 mg at 10/13/21 0953   feeding supplement (BOOST / RESOURCE BREEZE) liquid 1 Container  1 Container Oral TID BM Hosie Poisson, MD   1 Container at 10/13/21 1418   hydrALAZINE (APRESOLINE) tablet 25 mg  25 mg Oral Q8H Adefeso, Oladapo, DO   25 mg at 10/13/21 1418   insulin aspart (novoLOG) injection 0-5 Units  0-5 Units Subcutaneous QHS Adefeso, Oladapo, DO       insulin aspart (novoLOG) injection 0-6 Units  0-6 Units Subcutaneous TID WC Adefeso, Oladapo, DO       [START ON 10/14/2021] levETIRAcetam (KEPPRA) tablet 500 mg  500 mg Oral QHS Hosie Poisson, MD       morphine (PF) 2 MG/ML injection 2 mg  2 mg Intravenous Q4H PRN Adefeso, Oladapo, DO       nicotine (NICODERM CQ - dosed in mg/24 hours) patch 14 mg  14 mg Transdermal Daily Hosie Poisson, MD   14 mg at 10/13/21 1106   ondansetron (ZOFRAN) tablet 4 mg  4 mg Oral Q6H PRN Adefeso, Oladapo, DO       Or   ondansetron (ZOFRAN) injection 4 mg  4 mg Intravenous Q6H PRN Adefeso, Oladapo, DO   4 mg at 10/13/21 0334   pantoprazole  (PROTONIX) injection 40 mg  40 mg Intravenous Q12H Adefeso, Oladapo, DO   40 mg at 10/13/21 0953   piperacillin-tazobactam (ZOSYN) 2.25 g in sodium chloride 0.9 % 50 mL IVPB  2.25 g Intravenous Q8H Hosie Poisson, MD 100 mL/hr at 10/13/21 9024 Restarted at 10/13/21 0973   pneumococcal 20-valent conjugate vaccine (PREVNAR 20) injection 0.5 mL  0.5 mL Intramuscular Prior to discharge Hosie Poisson, MD       sodium zirconium cyclosilicate (LOKELMA) packet 10 g  10 g Oral BID Hosie Poisson, MD   10 g at 10/13/21 1106    Allergies as of 10/12/2021 - Review Complete 10/12/2021  Allergen Reaction Noted   Aspirin Palpitations and Other (See Comments) 10/29/2010   Metformin and related  01/12/2021    Family History  Problem Relation Age of Onset   Diabetes Sister    Chronic Renal Failure Neg Hx    Colon cancer Neg Hx    Colon polyps Neg Hx     Social History   Socioeconomic History   Marital status: Legally Separated    Spouse name: Not on file   Number of children: Not on file   Years of education: Not on file   Highest education level: Not on file  Occupational History   Not on file  Tobacco Use   Smoking status: Never   Smokeless tobacco: Current    Types: Snuff  Vaping Use   Vaping Use: Never used  Substance and Sexual Activity   Alcohol use: Not Currently    Alcohol/week: 1.0 standard drink of alcohol    Types: 1 Cans of beer per week   Drug use: No   Sexual activity: Not Currently  Other Topics Concern   Not on file  Social History Narrative   Not on file   Social Determinants of Health   Financial Resource Strain: Not on file  Food Insecurity: Not on file  Transportation Needs: Not on file  Physical Activity: Not on file  Stress: Not on file  Social Connections: Not on file  Intimate Partner Violence: Not on file     Review of Systems  Gen: see HPI CV: Denies chest pain, heart palpitations, syncope, edema  Resp: Denies shortness of breath with rest, cough,  wheezing, coughing up blood, and pleurisy. GI: see HPI MS: Denies joint pain, limitation of movement, swelling, cramps, and atrophy.  Derm: Denies rash, itching, dry skin, hives. Psych: Denies depression, anxiety, memory loss, hallucinations, and confusion. Heme: Denies bruising or bleeding Neuro:  Denies any headaches, dizziness, paresthesias, shaking  Physical Exam   Vital Signs in last 24 hours: Temp:  [97.6 F (36.4 C)-98.1 F (36.7 C)] 97.7 F (36.5 C) (08/28 1416) Pulse Rate:  [62-70] 65 (08/28 1416) Resp:  [8-18] 18 (08/28 1416) BP: (144-184)/(55-127) 175/74 (08/28 1416) SpO2:  [96 %-100 %] 100 % (08/28 1416) Weight:  [42.4 kg] 42.4 kg (08/28 1201)    General:   Alert,  thin, chronically ill-appearing Head:  Normocephalic and atraumatic. Eyes:  Sclera clear, no icterus.   Conjunctiva pink. Ears:  Normal auditory acuity. Lungs:  Clear throughout to auscultation.   Heart:  S1 S2 present with systolic murmur Abdomen:  Soft, mild TTP epigastric and nondistended. No masses, hepatosplenomegaly or hernias noted. Normal bowel sounds, without guarding, and without rebound.   Msk:  Symmetrical without gross deformities. Normal posture. Extremities:  Without edema. Neurologic:  Alert and  oriented x4. Skin:  Intact without significant lesions or rashes. Psych:  Alert and cooperative. Normal mood and affect.  Intake/Output from previous day: No intake/output data recorded. Intake/Output this shift: No intake/output data recorded.    Labs/Studies   Recent Labs Recent Labs    10/12/21 2001 10/13/21 0517  WBC 5.2 4.2  HGB 9.3* 8.4*  HCT 29.6* 27.2*  PLT 114* 113*   BMET Recent Labs    10/12/21 2001 10/13/21 0517  NA 133* 135  K 5.3* 5.2*  CL 98 101  CO2 26 27  GLUCOSE 170* 136*  BUN 35* 38*  CREATININE 5.73* 6.18*  CALCIUM 10.3 9.7   LFT Recent Labs    10/12/21 2001 10/13/21 0517  PROT 6.4* 5.3*  ALBUMIN 3.2* 2.6*  AST 31 26  ALT 35 29  ALKPHOS  253* 215*  BILITOT 0.9 0.9     Radiology/Studies DG Chest Portable 1 View  Result Date: 10/12/2021 CLINICAL DATA:  Nausea and vomiting. EXAM: PORTABLE CHEST 1 VIEW COMPARISON:  August 24, 2021 FINDINGS: The cardiac silhouette is borderline in size. There is no evidence of acute infiltrate, pleural effusion or pneumothorax. The visualized skeletal structures are unremarkable. IMPRESSION: No active cardiopulmonary disease. Electronically Signed   By: Virgina Norfolk M.D.   On: 10/12/2021 19:04   CT ABDOMEN PELVIS WO CONTRAST  Result Date: 10/12/2021 CLINICAL DATA:  Nausea and vomiting. EXAM: CT ABDOMEN AND PELVIS WITHOUT CONTRAST TECHNIQUE: Multidetector CT imaging of the abdomen and pelvis was performed following the standard protocol without IV contrast. RADIATION DOSE REDUCTION: This exam was performed according to the departmental dose-optimization program which includes automated exposure control, adjustment of the mA and/or kV according to patient size and/or use of iterative reconstruction technique. COMPARISON:  CT abdomen and pelvis 08/15/2020. FINDINGS: Lower chest: No acute abnormality.  Heart is enlarged. Hepatobiliary: No focal liver abnormality is seen. Status post cholecystectomy. No biliary dilatation. Pancreas: Pancreas is diffusely atrophic with coarse calcifications compatible with chronic pancreatitis, unchanged. No acute inflammatory changes. Spleen: Normal in size without focal abnormality. Adrenals/Urinary Tract: There is some punctate nonobstructing bilateral renal calculi. Prominent renal vascular calcifications are again seen. There is no hydronephrosis. No ureteral calculi are seen. The  adrenal glands are within normal limits. The bladder is under distended and not well evaluated. Stomach/Bowel: There is rectosigmoid wall thickening with mild surrounding inflammation. No dilated bowel loops, pneumatosis or free air. There is questionable wall thickening of the ascending colon.  Small bowel is within normal limits. The stomach is decompressed. Gastric wall thickening not excluded. Vascular/Lymphatic: No significant vascular findings are present. No enlarged abdominal or pelvic lymph nodes. Extensive peripheral vascular calcifications are present. Reproductive: Uterus and bilateral adnexa are unremarkable. Other: Small volume ascites.  Body wall edema.  No focal hernia. Musculoskeletal: The bones are osteopenic. Chronic compression deformities of T10, T12 and L2 appear similar to the prior study. Right hip arthroplasty is similar to the prior study. IMPRESSION: 1. Rectosigmoid wall thickening and inflammation compatible with proctocolitis. 2. Questionable ascending colitis. 3. Questionable gastritis. 4. Small volume ascites and body wall edema. 5. Nonobstructing bilateral renal calculi. 6.  Aortic Atherosclerosis (ICD10-I70.0). Electronically Signed   By: Ronney Asters M.D.   On: 10/12/2021 19:03   CT Head Wo Contrast  Result Date: 10/12/2021 CLINICAL DATA:  Mental status change EXAM: CT HEAD WITHOUT CONTRAST TECHNIQUE: Contiguous axial images were obtained from the base of the skull through the vertex without intravenous contrast. RADIATION DOSE REDUCTION: This exam was performed according to the departmental dose-optimization program which includes automated exposure control, adjustment of the mA and/or kV according to patient size and/or use of iterative reconstruction technique. COMPARISON:  Head CT dated August 21, 2021 FINDINGS: Brain: No evidence of acute infarction, hemorrhage, hydrocephalus, extra-axial collection or mass lesion/mass effect. Vascular: No hyperdense vessel or unexpected calcification. Skull: Normal. Negative for fracture or focal lesion. Sinuses/Orbits: No acute finding. Other: None. IMPRESSION: No acute intracranial abnormality. Electronically Signed   By: Yetta Glassman M.D.   On: 10/12/2021 18:57     Assessment   Erica [name] is a 66 y.o. year old  female  with a history of DM, HTN, hyperlipidemia, ESRD on dialysis, history of chronic pancreatitis, seizures, CAD, presenting to the ED yesterday with acute N/V, abdominal pain, and diarrhea. CT abd/pelvis without contrast showed proctocolitis, questionable ascending colitis, possible gastritis. Hgb 9.3 on admission, down to 8.4 this morning. Previously 11.2 several weeks ago. Heme positive stool. Iron studies in process.GI consulted due to heme positive stool and anemia.  Anemia: with heme positive stool. Hgb down to 8.4 this morning from 9.3 yesterday and previously in 11 range several weeks ago. However, she is without any overt GI bleeding. Recommend supportive measures and outpatient colonoscopy/EGD. If evidence for melena or transfusion dependent anemia, can pursue inpatient.  Colitis: suspect acute gastroenteritis as reports acute onset of N/V/diarrhea. Recommend stool studies if any further diarrhea, although this seems to have abated. Will need outpatient colonoscopy in 6 weeks for direct visualization. Last colonoscopy in 2012 with adenoma and fair prep.   Solid food dysphagia: difficult historian. Can pursue outpatient EGD/dilation unless unable to advance diet while inpatient.   Weight loss: unintentional. Will need colonoscopy/EGD/dilation as outpatient. Does not have food aversion but does note poor appetite, loss of taste. No postprandial abdominal pain.   Isolated elevated alk phos: no dilated CBD. Follow clinically for now; further evaluation if remains persistently elevated.      Plan / Recommendations    Clear liquids, advance to soft diet as tolerated Add iron studies Empiric antibiotics Stool studies if any recurrent diarrhea Follow H/H Outpatient colonoscopy/EGD/dilation     10/13/2021, 3:06 PM  Annitta Needs, PhD, ANP-BC [facility]  Gastroenterology

## 2021-10-13 NOTE — Evaluation (Signed)
Occupational Therapy Evaluation Patient Details Name: Erica [name] MRN: [id number] DOB: 1955-09-14 Today's Date: 10/13/2021   History of Present Illness Erica [name] is a 66 y.o. female with medical history significant of T2DM, hypertension, hyperlipidemia, ESRD on HD (TTS) seizure disorder, CAD who presents to the emergency department via EMS due to nausea and vomiting which started yesterday, she vomited again this morning after breakfast.  Vomitus was nonbloody and she denies abdominal pain.  Patient has chronic confusion, patient missed her dialysis yesterday due to not feeling well.  She denies chest pain, shortness of breath, fever, chills, diarrhea.   Clinical Impression    Pt agreeable to OT evaluation. Pt is assisted by family at baseline for most ADL's. She reports ability to transfer with RW and assist from family at baseline. Today pt is very weak and only able to stand for a couple seconds before returning to the bed. Pt B UE is very weak with slow labored sequential finger touching indicating poor fine motor skills. Pt was able to scoot up in bed while supine with verbal cuing and extended time. Overall pt shows poor endurance and strength at this time. Pt will benefit from continued OT in the hospital and recommended venue below to increase strength, balance, and endurance for safe ADL's.        Recommendations for follow up therapy are one component of a multi-disciplinary discharge planning process, led by the attending physician.  Recommendations may be updated based on patient status, additional functional criteria and insurance authorization.   Follow Up Recommendations  Skilled nursing-short term rehab (<3 hours/day)    Assistance Recommended at Discharge Intermittent Supervision/Assistance  Patient can return home with the following A lot of help with walking and/or transfers;A lot of help with bathing/dressing/bathroom;Assistance with cooking/housework;Assist  for transportation;Help with stairs or ramp for entrance    Functional Status Assessment  Patient has had a recent decline in their functional status and/or demonstrates limited ability to make significant improvements in function in a reasonable and predictable amount of time  Equipment Recommendations  None recommended by OT    Recommendations for Other Services       Precautions / Restrictions Precautions Precautions: Fall Restrictions Weight Bearing Restrictions: No      Mobility Bed Mobility Overal bed mobility: Needs Assistance Bed Mobility: Supine to Sit, Sit to Supine     Supine to sit: Min assist Sit to supine: Mod assist   General bed mobility comments: assist to pull to seated EOB and assist for BLE back into bed    Transfers Overall transfer level: Needs assistance Equipment used: Rolling walker (2 wheels) Transfers: Sit to/from Stand Sit to Stand: Mod assist           General transfer comment: labored movement and very quick to fatigue and return to bed      Balance Overall balance assessment: Needs assistance     Sitting balance - Comments: seated EOB fair to good                                   ADL either performed or assessed with clinical judgement   ADL Overall ADL's : Needs assistance/impaired     Grooming: Minimal assistance;Sitting;Min guard   Upper Body Bathing: Moderate assistance;Minimal assistance;Sitting   Lower Body Bathing: Maximal assistance;Sitting/lateral leans   Upper Body Dressing : Minimal assistance;Moderate assistance   Lower Body Dressing: Maximal assistance;Sitting/lateral leans  Toilet Transfer: Moderate assistance;Rolling walker (2 wheels);Maximal assistance Toilet Transfer Details (indicate cue type and reason): Partially simualted via sit to stand with RW.         Functional mobility during ADLs: Moderate assistance;Maximal assistance;Rolling walker (2 wheels) General ADL Comments: Pt  very weak and only able to stand for a few second before returning to the bed.     Vision Baseline Vision/History: 1 Wears glasses Ability to See in Adequate Light: 1 Impaired Patient Visual Report: Other (comment) (Pt reports her vision is blurry but agreed this could be because she does not have her glasses.) Vision Assessment?: No apparent visual deficits Additional Comments: other than baseline report of blurry vision                Pertinent Vitals/Pain Pain Assessment Pain Assessment: 0-10 Pain Score: 9  Pain Location: head Pain Descriptors / Indicators: Throbbing Pain Intervention(s): Limited activity within patient's tolerance, Monitored during session, Repositioned     Hand Dominance Right   Extremity/Trunk Assessment Upper Extremity Assessment Upper Extremity Assessment: Generalized weakness   Lower Extremity Assessment Lower Extremity Assessment: Defer to PT evaluation   Cervical / Trunk Assessment Cervical / Trunk Assessment: Normal   Communication Communication Communication: No difficulties   Cognition Arousal/Alertness: Awake/alert Behavior During Therapy: WFL for tasks assessed/performed Overall Cognitive Status: Within Functional Limits for tasks assessed                                                        Home Living Family/patient expects to be discharged to:: Private residence Living Arrangements: Children Available Help at Discharge: Family;Available PRN/intermittently Type of Home: Apartment Home Access: Level entry     Home Layout: One level     Bathroom Shower/Tub: Teacher, early years/pre: Standard Bathroom Accessibility: Yes   Home Equipment: Conservation officer, nature (2 wheels);BSC/3in1;Wheelchair - manual   Additional Comments: Taken via PT note      Prior Functioning/Environment Prior Level of Function : Needs assist       Physical Assist : Mobility (physical);ADLs (physical) Mobility  (physical): Transfers;Gait;Stairs;Bed mobility ADLs (physical): Bathing;Dressing;Toileting;IADLs;Grooming Mobility Comments: [place] ambulator with RW and assist from son, mostly uses wheel chair for mobility (per PT) ADLs Comments: Pt reports assist needed from family for all ADL's but feeding. Pt reported to PT that she was assisted for IADL's as well.        OT Problem List: Decreased strength;Decreased activity tolerance;Impaired balance (sitting and/or standing)      OT Treatment/Interventions: Self-care/ADL training;Therapeutic exercise;Therapeutic activities;Patient/family education;Balance training    OT Goals(Current goals can be found in the care plan section) Acute Rehab OT Goals Patient Stated Goal: Open to rehab. OT Goal Formulation: With patient Time For Goal Achievement: 10/27/21 Potential to Achieve Goals: Fair  OT Frequency: Min 2X/week                                   End of Session Equipment Utilized During Treatment: Rolling walker (2 wheels);Gait belt Nurse Communication: Other (comment) (notified pt has a headache)  Activity Tolerance: Patient tolerated treatment well Patient left: with call bell/phone within reach;in bed;with bed alarm set  OT Visit Diagnosis: Unsteadiness on feet (R26.81);Other abnormalities of gait and mobility (R26.89);Muscle weakness (generalized) (M62.81)  Time: 1975-8832 OT Time Calculation (min): 17 min Charges:  OT General Charges $OT Visit: 1 Visit OT Evaluation $OT Eval Low Complexity: 1 Low  Ulric Salzman OT, MOT  Larey Seat 10/13/2021, 3:31 PM

## 2021-10-13 NOTE — ED Notes (Signed)
Readjusted and dressed IV line for comfort and patentency.

## 2021-10-14 DIAGNOSIS — D649 Anemia, unspecified: Secondary | ICD-10-CM

## 2021-10-14 DIAGNOSIS — I10 Essential (primary) hypertension: Secondary | ICD-10-CM | POA: Diagnosis not present

## 2021-10-14 DIAGNOSIS — K529 Noninfective gastroenteritis and colitis, unspecified: Secondary | ICD-10-CM | POA: Diagnosis not present

## 2021-10-14 DIAGNOSIS — R778 Other specified abnormalities of plasma proteins: Secondary | ICD-10-CM | POA: Diagnosis not present

## 2021-10-14 DIAGNOSIS — K922 Gastrointestinal hemorrhage, unspecified: Secondary | ICD-10-CM | POA: Diagnosis not present

## 2021-10-14 DIAGNOSIS — N186 End stage renal disease: Secondary | ICD-10-CM | POA: Diagnosis not present

## 2021-10-14 LAB — CBC
HCT: 27.7 % — ABNORMAL LOW (ref 36.0–46.0)
Hemoglobin: 8.4 g/dL — ABNORMAL LOW (ref 12.0–15.0)
MCH: 32.2 pg (ref 26.0–34.0)
MCHC: 30.3 g/dL (ref 30.0–36.0)
MCV: 106.1 fL — ABNORMAL HIGH (ref 80.0–100.0)
Platelets: 104 10*3/uL — ABNORMAL LOW (ref 150–400)
RBC: 2.61 MIL/uL — ABNORMAL LOW (ref 3.87–5.11)
RDW: 14.8 % (ref 11.5–15.5)
WBC: 3.6 10*3/uL — ABNORMAL LOW (ref 4.0–10.5)
nRBC: 0 % (ref 0.0–0.2)

## 2021-10-14 LAB — HEPATITIS B SURFACE ANTIGEN
Hepatitis B Surface Ag: NONREACTIVE
Hepatitis B Surface Ag: NONREACTIVE

## 2021-10-14 LAB — GLUCOSE, CAPILLARY
Glucose-Capillary: 81 mg/dL (ref 70–99)
Glucose-Capillary: 130 mg/dL — ABNORMAL HIGH (ref 70–99)
Glucose-Capillary: 164 mg/dL — ABNORMAL HIGH (ref 70–99)
Glucose-Capillary: 138 mg/dL — ABNORMAL HIGH (ref 70–99)

## 2021-10-14 LAB — COMPREHENSIVE METABOLIC PANEL
ALT: 28 U/L (ref 0–44)
AST: 24 U/L (ref 15–41)
Albumin: 2.5 g/dL — ABNORMAL LOW (ref 3.5–5.0)
Alkaline Phosphatase: 205 U/L — ABNORMAL HIGH (ref 38–126)
Anion gap: 10 (ref 5–15)
BUN: 42 mg/dL — ABNORMAL HIGH (ref 8–23)
CO2: 23 mmol/L (ref 22–32)
Calcium: 9.2 mg/dL (ref 8.9–10.3)
Chloride: 102 mmol/L (ref 98–111)
Creatinine, Ser: 6.54 mg/dL — ABNORMAL HIGH (ref 0.44–1.00)
GFR, Estimated: 7 mL/min — ABNORMAL LOW (ref >60)
Glucose, Bld: 73 mg/dL (ref 70–99)
Potassium: 4.8 mmol/L (ref 3.5–5.1)
Sodium: 135 mmol/L (ref 135–145)
Total Bilirubin: 0.8 mg/dL (ref 0.3–1.2)
Total Protein: 5.3 g/dL — ABNORMAL LOW (ref 6.5–8.1)

## 2021-10-14 LAB — HEPATITIS C ANTIBODY: HCV Ab: NONREACTIVE

## 2021-10-14 LAB — MRSA NEXT GEN BY PCR, NASAL: MRSA by PCR Next Gen: NOT DETECTED

## 2021-10-14 LAB — C DIFFICILE QUICK SCREEN W PCR REFLEX
C Diff antigen: NEGATIVE
C Diff interpretation: NOT DETECTED
C Diff toxin: NEGATIVE

## 2021-10-14 LAB — HEPATITIS B SURFACE ANTIBODY,QUALITATIVE: Hep B S Ab: REACTIVE — AB

## 2021-10-14 LAB — RESP PANEL BY RT-PCR (FLU A&B, COVID) ARPGX2
Influenza A by PCR: NEGATIVE
Influenza B by PCR: NEGATIVE
SARS Coronavirus 2 by RT PCR: NEGATIVE

## 2021-10-14 LAB — HEPATITIS B CORE ANTIBODY, TOTAL: Hep B Core Total Ab: NONREACTIVE

## 2021-10-14 MED ORDER — RENA-VITE PO TABS
1.0000 | ORAL_TABLET | Freq: Every day | ORAL | Status: DC
  Administered 2021-10-14 – 2021-10-18 (×5): 1 via ORAL
  Filled 2021-10-14 (×5): qty 1

## 2021-10-14 MED ORDER — LIDOCAINE HCL (PF) 1 % IJ SOLN: 5.0000 mL | INTRAMUSCULAR | Status: DC | PRN

## 2021-10-14 MED ORDER — PROSOURCE PLUS PO LIQD
30.0000 mL | Freq: Two times a day (BID) | ORAL | Status: DC
  Administered 2021-10-14 – 2021-10-19 (×9): 30 mL via ORAL
  Filled 2021-10-14 (×10): qty 30

## 2021-10-14 MED ORDER — CALCITRIOL 0.25 MCG PO CAPS
ORAL_CAPSULE | ORAL | Status: AC
  Administered 2021-10-14: 0.25 ug via ORAL
  Filled 2021-10-14: qty 1

## 2021-10-14 MED ORDER — LIDOCAINE-PRILOCAINE 2.5-2.5 % EX CREA: 1.0000 | TOPICAL_CREAM | CUTANEOUS | Status: DC | PRN

## 2021-10-14 MED ORDER — CHLORHEXIDINE GLUCONATE CLOTH 2 % EX PADS
6.0000 | MEDICATED_PAD | Freq: Every day | CUTANEOUS | Status: DC
  Administered 2021-10-14 – 2021-10-19 (×5): 6 via TOPICAL

## 2021-10-14 MED ORDER — PENTAFLUOROPROP-TETRAFLUOROETH EX AERO: 1.0000 | INHALATION_SPRAY | CUTANEOUS | Status: DC | PRN

## 2021-10-14 MED ORDER — HYDRALAZINE HCL 25 MG PO TABS
50.0000 mg | ORAL_TABLET | Freq: Three times a day (TID) | ORAL | Status: DC
  Administered 2021-10-14 – 2021-10-19 (×13): 50 mg via ORAL
  Filled 2021-10-14 (×13): qty 2

## 2021-10-14 NOTE — NC FL2 (Signed)
[place] MEDICAID FL2 LEVEL OF CARE SCREENING TOOL     IDENTIFICATION  Patient Name: Erica [name] Birthdate: Jun 29, 1955 Sex: female Admission Date (Current Location): 10/12/2021  [facility] and [place] Number:  [employer] and Address:  [place] [address] [place]      Provider Number: [id number]  Attending Physician Name and Address:  Hosie Poisson, MD  Relative Name and Phone Number:  Lorenda Hatchet)   [phone]    Current Level of Care: Hospital Recommended Level of Care: [place] Prior Approval Number:    Date Approved/Denied:   PASRR Number: [id number]  Discharge Plan: SNF    Current Diagnoses: Patient Active Problem List   Diagnosis Date Noted   Macrocytic anemia 10/13/2021   GI bleed 10/13/2021   Elevated troponin 10/13/2021   GERD (gastroesophageal reflux disease) 10/13/2021   Colitis 10/12/2021   Atrial fibrillation ([place])    Hypothermia 06/19/2021   Abnormal EKG 06/19/2021   Right hip pain    CAD (coronary artery disease) 09/17/2020   Altered mental status    Acute metabolic encephalopathy 02/63/7858   Diarrhea    Pressure injury of skin 06/09/2020   Sacral fracture ([place]) 06/08/2020   Thrombocytopenia ([place]) 06/08/2020   Hypertensive urgency 06/08/2020   (HFpEF) heart failure with preserved ejection fraction ([place]) 06/08/2020   Fall 06/08/2020   Elevated MCV 06/08/2020   Palliative care by specialist    Generalized weakness 11/14/2019   HCAP (healthcare-associated pneumonia) 11/14/2019   Hyperkalemia 11/13/2019   Hyponatremia 09/27/2019   Closed fracture of right proximal humerus 04/20/2019   Surgery, elective    Seizure ([place]) 11/12/2018   History of hydronephrosis --stents in place 12/08/2017   Stroke ([place]) 11/29/2017   Seizures ([place])    Hydronephrosis    Recurrent UTI    Diabetes mellitus type 2 in nonobese [facility])    ESRD on dialysis ([place])    Anemia    Chronic diastolic  congestive heart failure ([place])    Essential hypertension    PICC (peripherally inserted central catheter) in place    Acute pyelonephritis 11/12/2017   Uncontrolled type 2 diabetes mellitus with hyperglycemia, with long-term current use of insulin ([place]) 11/10/2017   Renal failure 11/08/2017   Seizure disorder ([place]) 11/08/2017   Orthostatic hypotension 07/25/2014   Moderate protein malnutrition ([place]) 09/15/2013   Aspiration pneumonia ([place]) 09/15/2013   Acute respiratory failure requiring reintubation ([place]) 09/11/2013   probable Seizures due to metabolic disorder 85/03/7739   Lactic acidosis 03/19/2013   Abdominal pain 03/19/2013   Rotavirus infection 10/29/2012   Diabetes mellitus with end-stage renal disease ([place]) 10/29/2012   Protein-calorie malnutrition, severe ([place]) 10/27/2012   NSTEMI (non-ST elevated myocardial infarction) ([place]) 10/26/2012   Fever, unspecified 10/26/2012   Hypotension 28/78/6767   Metabolic acidosis 20/94/7096   Chronic diarrhea 10/25/2012   Tobacco abuse 10/25/2012   DKA (diabetic ketoacidoses) 09/09/2012   Dehydration 09/09/2012   DKA, type 2 ([place]) 05/20/2012   Abnormal LFTs 05/20/2012   Heart murmur, systolic 28/36/6294   Hypoglycemia 76/54/6503   Metabolic encephalopathy 54/65/6812   Alcohol abuse 07/08/2011   Hypokalemia 07/08/2011   Nausea & vomiting 07/08/2011   H/O chronic pancreatitis 07/08/2011    Orientation RESPIRATION BLADDER Height & Weight     Self, Time, Situation, Place  Normal Continent Weight: 92 lb 13 oz (42.1 kg) Height:  4\' 11"  (149.9 cm)  BEHAVIORAL SYMPTOMS/MOOD NEUROLOGICAL BOWEL NUTRITION STATUS      Continent Diet (see  d/c summary)  AMBULATORY STATUS COMMUNICATION OF NEEDS Skin   Extensive Assist Verbally Normal                       Personal Care Assistance Level of Assistance  Bathing, Dressing, Feeding Bathing Assistance: Limited assistance Feeding assistance: Independent Dressing Assistance: Limited  assistance     Functional Limitations Info  Sight, Hearing, Speech Sight Info: Adequate Hearing Info: Adequate      SPECIAL CARE FACTORS FREQUENCY  PT (By licensed PT), OT (By licensed OT)     PT Frequency: 5x/week OT Frequency: 3x/week            Contractures Contractures Info: Not present    Additional Factors Info  Code Status, Allergies Code Status Info: Full Code Allergies Info: Aspirin, Metforin and related           Current Medications (10/14/2021):  This is the current hospital active medication list Current Facility-Administered Medications  Medication Dose Route Frequency Provider Last Rate Last Admin   (feeding supplement) PROSource Plus liquid 30 mL  30 mL Oral BID BM Hosie Poisson, MD       acetaminophen (TYLENOL) tablet 650 mg  650 mg Oral Q6H PRN Adefeso, Oladapo, DO       Or   acetaminophen (TYLENOL) suppository 650 mg  650 mg Rectal Q6H PRN Adefeso, Oladapo, DO       albuterol (PROVENTIL) (2.5 MG/3ML) 0.083% nebulizer solution 2.5 mg  2.5 mg Nebulization Q4H PRN Hosie Poisson, MD       calcitRIOL (ROCALTROL) 0.25 MCG capsule            calcitRIOL (ROCALTROL) capsule 0.25 mcg  0.25 mcg Oral Q T,Th,Sa-HD Adefeso, Oladapo, DO       carvedilol (COREG) tablet 12.5 mg  12.5 mg Oral BID Adefeso, Oladapo, DO   12.5 mg at 10/13/21 2116   Chlorhexidine Gluconate Cloth 2 % PADS 6 each  6 each Topical Q0600 Donato Heinz, MD       feeding supplement (BOOST / RESOURCE BREEZE) liquid 1 Container  1 Container Oral TID BM Hosie Poisson, MD   1 Container at 10/14/21 0926   hydrALAZINE (APRESOLINE) tablet 25 mg  25 mg Oral Q8H Adefeso, Oladapo, DO   25 mg at 10/14/21 0424   insulin aspart (novoLOG) injection 0-5 Units  0-5 Units Subcutaneous QHS Adefeso, Oladapo, DO   2 Units at 10/13/21 2123   insulin aspart (novoLOG) injection 0-6 Units  0-6 Units Subcutaneous TID WC Adefeso, Oladapo, DO       levETIRAcetam (KEPPRA) tablet 500 mg  500 mg Oral QHS Hosie Poisson, MD        lidocaine (PF) (XYLOCAINE) 1 % injection 5 mL  5 mL Intradermal PRN Donato Heinz, MD       lidocaine-prilocaine (EMLA) cream 1 Application  1 Application Topical PRN Donato Heinz, MD       morphine (PF) 2 MG/ML injection 2 mg  2 mg Intravenous Q4H PRN Adefeso, Oladapo, DO       multivitamin (RENA-VIT) tablet 1 tablet  1 tablet Oral QHS Hosie Poisson, MD       nicotine (NICODERM CQ - dosed in mg/24 hours) patch 14 mg  14 mg Transdermal Daily Hosie Poisson, MD   14 mg at 10/14/21 0851   ondansetron (ZOFRAN) tablet 4 mg  4 mg Oral Q6H PRN Adefeso, Oladapo, DO       Or   ondansetron (ZOFRAN) injection 4 mg  4  mg Intravenous Q6H PRN Adefeso, Oladapo, DO   4 mg at 10/13/21 0334   pantoprazole (PROTONIX) injection 40 mg  40 mg Intravenous Q12H Adefeso, Oladapo, DO   40 mg at 10/14/21 0847   pentafluoroprop-tetrafluoroeth (GEBAUERS) aerosol 1 Application  1 Application Topical PRN Donato Heinz, MD       piperacillin-tazobactam (ZOSYN) 2.25 g in sodium chloride 0.9 % 50 mL IVPB  2.25 g Intravenous Q8H Hosie Poisson, MD 100 mL/hr at 10/14/21 0854 2.25 g at 10/14/21 0854   pneumococcal 20-valent conjugate vaccine (PREVNAR 20) injection 0.5 mL  0.5 mL Intramuscular Prior to discharge Hosie Poisson, MD         Discharge Medications: Please see discharge summary for a list of discharge medications.  Relevant Imaging Results:  Relevant Lab Results:   Additional Information SSN: [id number]  Lamesha Tibbits, Clydene Pugh, LCSW

## 2021-10-14 NOTE — Procedures (Signed)
   HEMODIALYSIS TREATMENT NOTE:   Uneventful 3.5 hour heparin-free treatment completed using left upper arm loop AVG (15g/retrograde flow). Goal met: 2 liters removed without interruption in UF.  All blood was returned and hemostasis was achieved in 20 minutes. No changes from pre-HD assessment.  Hand-off given to Rise Patience, LPN.  Rockwell Alexandria, RN

## 2021-10-14 NOTE — Progress Notes (Addendum)
Subjective: Feels a little better, but "not too well". Continues with constant upper abdominal pain. Worsened by eating when she was at home, but not worsened by clear liquids here. Burning sensation. Hasn't required pain medications since admission. Reports everything she eats comes right through her like water. About 3 watery BMs yesterday. Not sure if she had blood in her stool or black stool. Reports stools were "normal" prior to acute illness. Continues with intermittent nausea when she wakes up as she feels a little dizzy. No association with eating at this point. No vomiting. No history of reflux. Denies NSAID use.   Patient had a BM while I was in the room which was brown with mucous.   Objective: Vital signs in last 24 hours: Temp:  [97.5 F (36.4 C)-97.9 F (36.6 C)] 97.5 F (36.4 C) (08/29 0459) Pulse Rate:  [62-68] 64 (08/29 0459) Resp:  [16-20] 16 (08/29 0459) BP: (149-182)/(56-76) 171/69 (08/29 0459) SpO2:  [100 %] 100 % (08/29 0459) Weight:  [42.4 kg] 42.4 kg (08/28 1201)   General:   Alert and oriented, pleasant, NAD. Head:  Normocephalic and atraumatic. Eyes:  No icterus, sclera clear. Conjuctiva pink.  Abdomen:  Bowel sounds present, soft,  non-distended. Mild generalized TTP, primarily in the RLQ, RUQ, epigastric area, LUQ, and periumbilical area. No HSM or hernias noted. No rebound or guarding. No masses appreciated  Msk:  Symmetrical without gross deformities. Normal posture. Extremities:  Without edema. Neurologic:  Alert and  oriented x4;  grossly normal neurologically. Skin:  Warm and dry, intact without significant lesions.  Psych: Normal mood and affect.  Intake/Output from previous day: 08/28 0701 - 08/29 0700 In: 1472.2 [P.O.:480; IV Piggyback:992.2] Out: -  Intake/Output this shift: No intake/output data recorded.  Lab Results: Recent Labs    10/12/21 2001 10/13/21 0517 10/14/21 0744  WBC 5.2 4.2 3.6*  HGB 9.3* 8.4* 8.4*  HCT 29.6* 27.2*  27.7*  PLT 114* 113* 104*   BMET Recent Labs    10/12/21 2001 10/13/21 0517 10/14/21 0744  NA 133* 135 135  K 5.3* 5.2* 4.8  CL 98 101 102  CO2 _0 GLUCOSE 170* 136* 73  BUN 35* 38* 42*  CREATININE 5.73* 6.18* 6.54*  CALCIUM 10.3 9.7 9.2   LFT Recent Labs    10/12/21 2001 10/13/21 0517 10/14/21 0744  PROT 6.4* 5.3* 5.3*  ALBUMIN 3.2* 2.6* 2.5*  AST _1 ALT 35 29 28  ALKPHOS 253* 215* 205*  BILITOT 0.9 0.9 0.8    Hepatitis Panel Recent Labs    10/13/21 0517  HEPBSAG NON REACTIVE    Studies/Results: DG Chest Portable 1 View  Result Date: 10/12/2021 CLINICAL DATA:  Nausea and vomiting. EXAM: PORTABLE CHEST 1 VIEW COMPARISON:  August 24, 2021 FINDINGS: The cardiac silhouette is borderline in size. There is no evidence of acute infiltrate, pleural effusion or pneumothorax. The visualized skeletal structures are unremarkable. IMPRESSION: No active cardiopulmonary disease. Electronically Signed   By: Virgina Norfolk M.D.   On: 10/12/2021 19:04   CT ABDOMEN PELVIS WO CONTRAST  Result Date: 10/12/2021 CLINICAL DATA:  Nausea and vomiting. EXAM: CT ABDOMEN AND PELVIS WITHOUT CONTRAST TECHNIQUE: Multidetector CT imaging of the abdomen and pelvis was performed following the standard protocol without IV contrast. RADIATION DOSE REDUCTION: This exam was performed according to the departmental dose-optimization program which includes automated exposure control, adjustment of the mA and/or kV according to patient size and/or use of iterative reconstruction technique.  COMPARISON:  CT abdomen and pelvis 08/15/2020. FINDINGS: Lower chest: No acute abnormality.  Heart is enlarged. Hepatobiliary: No focal liver abnormality is seen. Status post cholecystectomy. No biliary dilatation. Pancreas: Pancreas is diffusely atrophic with coarse calcifications compatible with chronic pancreatitis, unchanged. No acute inflammatory changes. Spleen: Normal in size without focal abnormality.  Adrenals/Urinary Tract: There is some punctate nonobstructing bilateral renal calculi. Prominent renal vascular calcifications are again seen. There is no hydronephrosis. No ureteral calculi are seen. The adrenal glands are within normal limits. The bladder is under distended and not well evaluated. Stomach/Bowel: There is rectosigmoid wall thickening with mild surrounding inflammation. No dilated bowel loops, pneumatosis or free air. There is questionable wall thickening of the ascending colon. Small bowel is within normal limits. The stomach is decompressed. Gastric wall thickening not excluded. Vascular/Lymphatic: No significant vascular findings are present. No enlarged abdominal or pelvic lymph nodes. Extensive peripheral vascular calcifications are present. Reproductive: Uterus and bilateral adnexa are unremarkable. Other: Small volume ascites.  Body wall edema.  No focal hernia. Musculoskeletal: The bones are osteopenic. Chronic compression deformities of T10, T12 and L2 appear similar to the prior study. Right hip arthroplasty is similar to the prior study. IMPRESSION: 1. Rectosigmoid wall thickening and inflammation compatible with proctocolitis. 2. Questionable ascending colitis. 3. Questionable gastritis. 4. Small volume ascites and body wall edema. 5. Nonobstructing bilateral renal calculi. 6.  Aortic Atherosclerosis (ICD10-I70.0). Electronically Signed   By: Ronney Asters M.D.   On: 10/12/2021 19:03   CT Head Wo Contrast  Result Date: 10/12/2021 CLINICAL DATA:  Mental status change EXAM: CT HEAD WITHOUT CONTRAST TECHNIQUE: Contiguous axial images were obtained from the base of the skull through the vertex without intravenous contrast. RADIATION DOSE REDUCTION: This exam was performed according to the departmental dose-optimization program which includes automated exposure control, adjustment of the mA and/or kV according to patient size and/or use of iterative reconstruction technique. COMPARISON:   Head CT dated August 21, 2021 FINDINGS: Brain: No evidence of acute infarction, hemorrhage, hydrocephalus, extra-axial collection or mass lesion/mass effect. Vascular: No hyperdense vessel or unexpected calcification. Skull: Normal. Negative for fracture or focal lesion. Sinuses/Orbits: No acute finding. Other: None. IMPRESSION: No acute intracranial abnormality. Electronically Signed   By: Yetta Glassman M.D.   On: 10/12/2021 18:57    Assessment: 66 y.o. year old female  with a history of DM, HTN, hyperlipidemia, ESRD on dialysis, chronic anemia, history of chronic pancreatitis, seizures, CAD, presenting to the ED yesterday with acute N/V, abdominal pain, and diarrhea. CT abd/pelvis without contrast showed proctocolitis, questionable ascending colitis, possible gastritis. Hgb 9.3 on admission, previously 11.2 several weeks ago. Heme positive stool. GI consulted due to heme positive stool and anemia.  Anemia with heme positive stool:  Hemoglobin 9.3 on admission, down to 8.4 yesterday, stable 8.4 today.  Previously in the 11 range several weeks ago. No overt GI bleeding.  No iron deficiency, B12, or folate deficiency. Heme positive stool may be related to colitis, but can't rule out upper GI source. Denies NSAIDs. We have recommended supportive measures for now, close monitoring, and outpatient colonoscopy/EGD unless she were to develop evidence of melena or transfusion dependent anemia.   Colitis:  CT abd/pelvis without contrast showed proctocolitis, questionable ascending colitis. Etiology unclear, but suspect acute gastroenteritis due to acute onset nausea, vomiting, diarrhea.  She is currently on empiric antibiotics.  Clinically, she continues with watery diarrhea and diffuse abdominal pain without much change since admission though she hasn't required pain medication since  day of admission. Tolerating clear liquid diet well. I have ordered stool studies today. She will need outpatient colonoscopy in  about 6 weeks or so for direct visualization unless she doesn't improve as clinically expected.  Last colonoscopy in 2012 with adenoma and fair prep.  Possible Gastritis:  Noted on CT without contrast. She is reporting upper abdominal pain and nausea though unclear if this is secondary to colitis vs possible gastritis. Denies history of reflux or NSAIDs. Currently on PPI BID. Planning on EGD in the outpatient setting.    Solid food dysphagia: Reports of solid food dysphagia but overall difficult historian.  Recommend outpatient EGD/dilation unless unable to advance diet.   Weight loss: Unintentional 7 lb weight loss over the last 8 months. Does not have food aversion but does note poor appetite, loss of taste. No chronic history of postprandial abdominal pain, but reports new onset postprandial pain in the setting of her current acute illness. She is tolerating clear liquids well at this time. Will need colonoscopy/EGD/dilation as outpatient.   Isolated elevated alk phos: S/p cholecystectomy with no biliary dilation. Trending down this admission. This is a chronic finding and could be secondary to ESRD/diabetes/fatty liver or other. Will check GGT, AMA, IgM.    Plan: C diff and GI pathogen panel.  GGT, AMA, IgM Will give 1 more day of clear liquids, likely advance tomorrow.  Continue empiric antibiotics.  Continue to monitor H&H.  Transfuse as needed. Outpatient colonoscopy/EGD/dilation unless patient does not improve clinically, is unable to advance her diet, develops overt GI bleeding or transfusion dependent anemia.   LOS: 2 days    10/14/2021, 9:40 AM   Aliene Altes, PA-C [facility]

## 2021-10-14 NOTE — Consult Note (Signed)
[name] Renal Consultation Note    Indication for Consultation:  Management of ESRD/hemodialysis; anemia, hypertension/volume and secondary hyperparathyroidism  HPI: Erica [name] is a 66 y.o. female with a PMH significant for HTN, DM, CAD, HLD, seizure disorder, h/o Etoh abuse (now in remission), chronic pancreatitis, and ESRD on HD TTS at [facility], who was brought via EMS with 2 day history of nausea and vomiting.  She missed her dialysis on 8/27 due to confusion and malaise.  In th ED, VSS, afebrile.  She did have abdominal tenderness on exam and guaiac + stool.  Labs notable for Na 133, K 5.3, gluc 170, Hgb 9.3, plt 114.  CT of abdomen showed colitis.  She was admitted for further workup and we were consulted to provide dialysis during her hospitalization.   Past Medical History:  Diagnosis Date   Alcohol-induced pancreatitis    CAD (coronary artery disease) 09/17/2020   Chronic diarrhea    Depression    Diabetes mellitus    fasting blood sugar 110-120s   Dialysis patient ([name])    Diastolic CHF ([name])    DKA (diabetic ketoacidoses)    Gastroparesis    GERD (gastroesophageal reflux disease)    Heart murmur    History of kidney stones    Hyperlipidemia    Hypertension    Hypokalemia    Muscle spasm    Neuropathic pain    Neuropathy    Hx: of   Pyelonephritis    Seizures ([name])    Vitamin B12 deficiency    Vitamin D deficiency    Past Surgical History:  Procedure Laterality Date   AV FISTULA PLACEMENT Left 12/15/2017   Procedure: INSERTION OF ARTERIOVENOUS (AV) GORE-TEX GRAFT ARM;  Surgeon: Rosetta Posner, MD;  Location: [facility];  Service: Vascular;  Laterality: Left;   CATARACT EXTRACTION W/PHACO Right 03/14/2015   Procedure: CATARACT EXTRACTION PHACO AND INTRAOCULAR LENS PLACEMENT ([place]);  Surgeon: Baruch Goldmann, MD;  Location: [facility];  Service: Ophthalmology;  Laterality: Right;  CDE:11.13   CATARACT EXTRACTION W/PHACO Left 04/11/2015   Procedure:  CATARACT EXTRACTION PHACO AND INTRAOCULAR LENS PLACEMENT LEFT EYE CDE=9.68;  Surgeon: Baruch Goldmann, MD;  Location: [facility];  Service: Ophthalmology;  Laterality: Left;   COLONOSCOPY  02/24/2010   fair prep, limited exam. Two small polyps removed. Tubular adenoma   cystoscopy with ureteral stent  Bilateral 10/07/2017   At [place] CV LINE RIGHT  11/17/2017   IR FLUORO GUIDE CV LINE RIGHT  11/22/2017   IR REMOVAL TUN CV CATH W/O FL  01/26/2018   IR US GUIDE VASC ACCESS RIGHT  11/17/2017   MULTIPLE EXTRACTIONS WITH ALVEOLOPLASTY N/A 06/13/2012   Procedure: MULTIPLE EXTRACION WITH ALVEOLOPLASTY EXTRACT: 18, 19, 20, 21, 22, 24, 25, 27, 28, 29, 30, 31;  Surgeon: Gae Bon, DDS;  Location: [facility];  Service: Oral Surgery;  Laterality: N/A;   ORIF HUMERUS FRACTURE Right 04/20/2019   Procedure: OPEN REDUCTION INTERNAL FIXATION (ORIF) PROXIMAL HUMERUS FRACTURE;  Surgeon: Marchia Bond, MD;  Location: [name];  Service: Orthopedics;  Laterality: Right;   TUBAL LIGATION     URETERAL STENT PLACEMENT  09/2017   Family History:   Family History  Problem Relation Age of Onset   Diabetes Sister    Chronic Renal Failure Neg Hx    Colon cancer Neg Hx    Colon polyps Neg Hx    Social History:  reports that she has never smoked. Her  smokeless tobacco use includes snuff. She reports that she does not currently use alcohol after a past usage of about 1.0 standard drink of alcohol per week. She reports that she does not use drugs. Allergies  Allergen Reactions   Aspirin Palpitations and Other (See Comments)    Listed on [facility] 11/12/18   Metformin And Related    Prior to Admission medications   Medication Sig Start Date End Date Taking? Authorizing Provider  albuterol (VENTOLIN HFA) 108 (90 Base) MCG/ACT inhaler Inhale 2 puffs into the lungs every 4 (four) hours as needed for wheezing or shortness of breath.    Yes [provider]  Biotin 10 MG TABS Take 10 mg by mouth  daily.   Yes [provider]  calcitRIOL (ROCALTROL) 0.25 MCG capsule Take 1 capsule (0.25 mcg total) by mouth Every Tuesday,Thursday,and Saturday with dialysis. 12/18/17  Yes [place], Ivan Anchors, PA-C  carvedilol (COREG) 12.5 MG tablet Take 1 tablet (12.5 mg total) by mouth 2 (two) times daily. 09/24/20  Yes Johnson, Clanford L, MD  ferric citrate (AURYXIA) 1 GM 210 MG(Fe) tablet Take 2 tablets (420 mg total) by mouth 3 (three) times daily with meals. 11/15/18  Yes Buriev, Arie Sabina, MD  folic acid (FOLVITE) 1 MG tablet Take 1 mg by mouth every morning.   Yes [provider]  hydrALAZINE (APRESOLINE) 50 MG tablet Take 0.5 tablets (25 mg total) by mouth every 8 (eight) hours. 06/21/21  Yes Tat, Shanon Brow, MD  insulin aspart (NOVOLOG) 100 UNIT/ML injection Inject 1-3 Units into the skin See admin instructions. Inject per sliding scale  If BS 151-250= 1 unit 251- 350= 2 units 351-400= 3 units 401-500= 3 units Give subcutaneous at bedtime   Yes [provider]  levETIRAcetam (KEPPRA) 500 MG tablet Take 1 tablet (500 mg total) by mouth 2 (two) times daily. Patient taking differently: Take 500 mg by mouth daily. 07/08/21  Yes Daleen Bo, MD  Multiple Vitamin (MULTIVITAMIN ADULT PO) Take 1 tablet by mouth daily.   Yes [provider]  ondansetron (ZOFRAN-ODT) 8 MG disintegrating tablet 8mg  ODT q4 hours prn nausea Patient taking differently: Take 4 mg by mouth every 8 (eight) hours as needed for nausea or vomiting. 09/23/21  Yes Delo, Nathaneil Canary, MD  oxyCODONE (OXY IR/ROXICODONE) 5 MG immediate release tablet Take 1 tablet (5 mg total) by mouth every 6 (six) hours as needed for severe pain. 06/21/21  Yes Tat, Shanon Brow, MD  pantoprazole (PROTONIX) 40 MG tablet Take 1 tablet (40 mg total) by mouth daily. 11/19/19 10/13/21 Yes Oswald Hillock, MD  traZODone (DESYREL) 150 MG tablet Take 150 mg by mouth at bedtime.   Yes [provider]  vitamin C (ASCORBIC ACID) 500 MG tablet Take 500 mg  by mouth 2 (two) times daily.   Yes [provider]  zinc sulfate 220 (50 Zn) MG capsule Take 220 mg by mouth daily.   Yes [provider]  lidocaine-prilocaine (EMLA) cream Apply 1 application. topically as directed. Apply to left arm fistula every tues, thurs, and sat for hemodialysis    [provider]   Current Facility-Administered Medications  Medication Dose Route Frequency Provider Last Rate Last Admin   acetaminophen (TYLENOL) tablet 650 mg  650 mg Oral Q6H PRN Adefeso, Oladapo, DO       Or   acetaminophen (TYLENOL) suppository 650 mg  650 mg Rectal Q6H PRN Adefeso, Oladapo, DO       albuterol (PROVENTIL) (2.5 MG/3ML) 0.083% nebulizer solution  2.5 mg  2.5 mg Nebulization Q4H PRN Hosie Poisson, MD       calcitRIOL (ROCALTROL) capsule 0.25 mcg  0.25 mcg Oral Q T,Th,Sa-HD Adefeso, Oladapo, DO       carvedilol (COREG) tablet 12.5 mg  12.5 mg Oral BID Adefeso, Oladapo, DO   12.5 mg at 10/13/21 2116   Chlorhexidine Gluconate Cloth 2 % PADS 6 each  6 each Topical Q0600 Donato Heinz, MD       feeding supplement (BOOST / RESOURCE BREEZE) liquid 1 Container  1 Container Oral TID BM Hosie Poisson, MD   1 Container at 10/14/21 0926   hydrALAZINE (APRESOLINE) tablet 25 mg  25 mg Oral Q8H Adefeso, Oladapo, DO   25 mg at 10/14/21 0424   insulin aspart (novoLOG) injection 0-5 Units  0-5 Units Subcutaneous QHS Adefeso, Oladapo, DO   2 Units at 10/13/21 2123   insulin aspart (novoLOG) injection 0-6 Units  0-6 Units Subcutaneous TID WC Adefeso, Oladapo, DO       levETIRAcetam (KEPPRA) tablet 500 mg  500 mg Oral QHS Hosie Poisson, MD       morphine (PF) 2 MG/ML injection 2 mg  2 mg Intravenous Q4H PRN Adefeso, Oladapo, DO       nicotine (NICODERM CQ - dosed in mg/24 hours) patch 14 mg  14 mg Transdermal Daily Hosie Poisson, MD   14 mg at 10/14/21 0851   ondansetron (ZOFRAN) tablet 4 mg  4 mg Oral Q6H PRN Adefeso, Oladapo, DO       Or   ondansetron (ZOFRAN) injection 4 mg   4 mg Intravenous Q6H PRN Adefeso, Oladapo, DO   4 mg at 10/13/21 0334   pantoprazole (PROTONIX) injection 40 mg  40 mg Intravenous Q12H Adefeso, Oladapo, DO   40 mg at 10/14/21 0847   piperacillin-tazobactam (ZOSYN) 2.25 g in sodium chloride 0.9 % 50 mL IVPB  2.25 g Intravenous Q8H Hosie Poisson, MD 100 mL/hr at 10/14/21 0854 2.25 g at 10/14/21 0854   pneumococcal 20-valent conjugate vaccine (PREVNAR 20) injection 0.5 mL  0.5 mL Intramuscular Prior to discharge Hosie Poisson, MD       Labs: Basic Metabolic Panel: Recent Labs  Lab 10/12/21 2001 10/13/21 0517 10/14/21 0744  NA 133* 135 135  K 5.3* 5.2* 4.8  CL 98 101 102  CO2 26 27 23   GLUCOSE 170* 136* 73  BUN 35* 38* 42*  CREATININE 5.73* 6.18* 6.54*  CALCIUM 10.3 9.7 9.2  PHOS  --  1.5*  --    Liver Function Tests: Recent Labs  Lab 10/12/21 2001 10/13/21 0517 10/14/21 0744  AST 31 26 24   ALT 35 29 28  ALKPHOS 253* 215* 205*  BILITOT 0.9 0.9 0.8  PROT 6.4* 5.3* 5.3*  ALBUMIN 3.2* 2.6* 2.5*   Recent Labs  Lab 10/12/21 2001  LIPASE 19   No results for input(s): "AMMONIA" in the last 168 hours. CBC: Recent Labs  Lab 10/12/21 2001 10/13/21 0517 10/14/21 0744  WBC 5.2 4.2 3.6*  NEUTROABS 3.2  --   --   HGB 9.3* 8.4* 8.4*  HCT 29.6* 27.2* 27.7*  MCV 101.7* 101.9* 106.1*  PLT 114* 113* 104*   Cardiac Enzymes: No results for input(s): "CKTOTAL", "CKMB", "CKMBINDEX", "TROPONINI" in the last 168 hours. CBG: Recent Labs  Lab 10/13/21 0759 10/13/21 1239 10/13/21 1706 10/13/21 2029 10/14/21 0737  GLUCAP 138* 126* 150* 220* 81   Iron Studies:  Recent Labs    10/13/21 1359  IRON 39  TIBC 131*  FERRITIN 1,582*   Studies/Results: DG Chest Portable 1 View  Result Date: 10/12/2021 CLINICAL DATA:  Nausea and vomiting. EXAM: PORTABLE CHEST 1 VIEW COMPARISON:  August 24, 2021 FINDINGS: The cardiac silhouette is borderline in size. There is no evidence of acute infiltrate, pleural effusion or pneumothorax. The  visualized skeletal structures are unremarkable. IMPRESSION: No active cardiopulmonary disease. Electronically Signed   By: Virgina Norfolk M.D.   On: 10/12/2021 19:04   CT ABDOMEN PELVIS WO CONTRAST  Result Date: 10/12/2021 CLINICAL DATA:  Nausea and vomiting. EXAM: CT ABDOMEN AND PELVIS WITHOUT CONTRAST TECHNIQUE: Multidetector CT imaging of the abdomen and pelvis was performed following the standard protocol without IV contrast. RADIATION DOSE REDUCTION: This exam was performed according to the departmental dose-optimization program which includes automated exposure control, adjustment of the mA and/or kV according to patient size and/or use of iterative reconstruction technique. COMPARISON:  CT abdomen and pelvis 08/15/2020. FINDINGS: Lower chest: No acute abnormality.  Heart is enlarged. Hepatobiliary: No focal liver abnormality is seen. Status post cholecystectomy. No biliary dilatation. Pancreas: Pancreas is diffusely atrophic with coarse calcifications compatible with chronic pancreatitis, unchanged. No acute inflammatory changes. Spleen: Normal in size without focal abnormality. Adrenals/Urinary Tract: There is some punctate nonobstructing bilateral renal calculi. Prominent renal vascular calcifications are again seen. There is no hydronephrosis. No ureteral calculi are seen. The adrenal glands are within normal limits. The bladder is under distended and not well evaluated. Stomach/Bowel: There is rectosigmoid wall thickening with mild surrounding inflammation. No dilated bowel loops, pneumatosis or free air. There is questionable wall thickening of the ascending colon. Small bowel is within normal limits. The stomach is decompressed. Gastric wall thickening not excluded. Vascular/Lymphatic: No significant vascular findings are present. No enlarged abdominal or pelvic lymph nodes. Extensive peripheral vascular calcifications are present. Reproductive: Uterus and bilateral adnexa are unremarkable.  Other: Small volume ascites.  Body wall edema.  No focal hernia. Musculoskeletal: The bones are osteopenic. Chronic compression deformities of T10, T12 and L2 appear similar to the prior study. Right hip arthroplasty is similar to the prior study. IMPRESSION: 1. Rectosigmoid wall thickening and inflammation compatible with proctocolitis. 2. Questionable ascending colitis. 3. Questionable gastritis. 4. Small volume ascites and body wall edema. 5. Nonobstructing bilateral renal calculi. 6.  Aortic Atherosclerosis (ICD10-I70.0). Electronically Signed   By: Ronney Asters M.D.   On: 10/12/2021 19:03   CT Head Wo Contrast  Result Date: 10/12/2021 CLINICAL DATA:  Mental status change EXAM: CT HEAD WITHOUT CONTRAST TECHNIQUE: Contiguous axial images were obtained from the base of the skull through the vertex without intravenous contrast. RADIATION DOSE REDUCTION: This exam was performed according to the departmental dose-optimization program which includes automated exposure control, adjustment of the mA and/or kV according to patient size and/or use of iterative reconstruction technique. COMPARISON:  Head CT dated August 21, 2021 FINDINGS: Brain: No evidence of acute infarction, hemorrhage, hydrocephalus, extra-axial collection or mass lesion/mass effect. Vascular: No hyperdense vessel or unexpected calcification. Skull: Normal. Negative for fracture or focal lesion. Sinuses/Orbits: No acute finding. Other: None. IMPRESSION: No acute intracranial abnormality. Electronically Signed   By: Yetta Glassman M.D.   On: 10/12/2021 18:57    ROS: Pertinent items are noted in HPI. Physical Exam: Vitals:   10/13/21 1701 10/13/21 2017 10/14/21 0003 10/14/21 0459  BP: (!) 182/76 (!) 173/62 (!) 156/67 (!) 171/69  Pulse: 68 66 64 64  Resp: 20 20 16 16   Temp: 97.7 F (36.5 C) 97.9 F (36.6 C)  (!)  97.5 F (36.4 C)  TempSrc: Oral   Oral  SpO2: 100% 100% 100% 100%  Weight:      Height:          Weight change:    Intake/Output Summary (Last 24 hours) at 10/14/2021 1015 Last data filed at 10/14/2021 0300 Gross per 24 hour  Intake 1472.19 ml  Output --  Net 1472.19 ml   BP (!) 171/69   Pulse 64   Temp (!) 97.5 F (36.4 C) (Oral)   Resp 16   Ht 4\' 11"  (1.499 m)   Wt 42.4 kg   SpO2 100%   BMI 18.88 kg/m  General appearance: no distress and slowed mentation Head: Normocephalic, without obvious abnormality, atraumatic Resp: clear to auscultation bilaterally Cardio: regular rate and rhythm, S1, S2 normal, no murmur, click, rub or gallop GI: +BS, soft, + diffuse tenderness to palpation, no rebound Extremities: extremities normal, atraumatic, no cyanosis or edema and 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 Dialysis Access: LUE AVG  Dialysis Orders: Center: [facility] on TTS . EDW 41 Kg HD Bath 2K/3Ca  Time 3:45 Heparin 1000 units. Access LUE AVG BFR 400 DFR 500    Calcitriol 0.25 mcg po/HD Micera 50 mcg IV every 2 weeks  Assessment/Plan:  Colitis - started on IV zosyn, PPI, and zofran  ESRD -  Plan for HD today, no heparin.  Hypertension/volume  - stable  Anemia  - likely due to ABLA and guaiac + stool.  Gi consulted.  Hbg dropped slightly to 8.4.  transfuse for Hgb <7  Metabolic bone disease -  continue with home meds when able to take po  Nutrition - advance diet per primary  Donetta Potts, MD [place] 10/14/2021, 10:15 AM

## 2021-10-14 NOTE — Progress Notes (Signed)
Initial Nutrition Assessment  DOCUMENTATION CODES:      INTERVENTION:  Boost Breeze po TID, each supplement provides 250 kcal and 9 grams of protein   ProSource 30 ml TID (each 30 ml provides 100 kcal, 15 gr protein)   Provide Nepro when diet advanced to full liquids (patient likes vanilla)  NUTRITION DIAGNOSIS:   Increased nutrient needs related to chronic illness (ESRD-HD) as evidenced by estimated needs.   GOAL:  Patient will meet greater than or equal to 90% of their needs  MONITOR:  PO intake, Supplement acceptance, Labs, Skin, Weight trends  REASON FOR ASSESSMENT:   Malnutrition Screening Tool    ASSESSMENT: Patient is a 66 yo female with hx of chronic diarrhea, Chronic pancreatitis, vitamin D and B-12 deficiency, CHF, DM, alcohol use, gastroparesis, CAD and ESRD/HD. Presents with nausea and vomiting. CT-findings colitis.   Patient tolerated clear liquids for lunch today. Usual intake 2 meals daily and drinks vanilla nepro. Her son assist with providing meals. Able to feed herself at baseline. Ambulates with wheelchair at home. At risk for malnutrition and micronutrient deficiency.  Poor dentition. Recommend soft foods. Home diet is regular. Patient reports no problem staying within fluid intake goal.   EDW- patient unable to provide. Weight history reviewed.   Medications:calcitriol, insulin, keppra, protonix.      Latest Ref Rng & Units 10/14/2021    7:44 AM 10/13/2021    5:17 AM 10/12/2021    8:01 PM  BMP  Glucose 70 - 99 mg/dL 73  136  170   BUN 8 - 23 mg/dL 42  38  35   Creatinine 0.44 - 1.00 mg/dL 6.54  6.18  5.73   Sodium 135 - 145 mmol/L 135  135  133   Potassium 3.5 - 5.1 mmol/L 4.8  5.2  5.3   Chloride 98 - 111 mmol/L 102  101  98   CO2 22 - 32 mmol/L 23  27  26    Calcium 8.9 - 10.3 mg/dL 9.2  9.7  10.3       NUTRITION - FOCUSED PHYSICAL EXAM: deferred  Diet Order:   Diet Order             Diet clear liquid Room service appropriate? Yes;  Fluid consistency: Thin  Diet effective now                   EDUCATION NEEDS:  Education needs have been addressed  Skin:  Skin Assessment: Reviewed RN Assessment (AV fistula -left upper arm)  Last BM:  unknown  Height:   Ht Readings from Last 1 Encounters:  10/13/21 4\' 11"  (1.499 m)    Weight:   Wt Readings from Last 1 Encounters:  10/14/21 42.1 kg    Ideal Body Weight:   45 kg  BMI:  Body mass index is 18.75 kg/m.  Estimated Nutritional Needs:   Kcal:  1300-1500  Protein:  65-70 gr  Fluid:  1200 ml daily  Colman Cater MS,RD,CSG,LDN Contact: Shea Evans

## 2021-10-14 NOTE — Progress Notes (Signed)
PROGRESS NOTE    Erica [name]  TIR:[id number] DOB: 01/26/1956 DOA: 10/12/2021 PCP: System, Provider Not In    Chief Complaint  Patient presents with   Emesis   Headache   Fatigue    Brief Narrative:  Erica [name] is a 66 y.o. female with medical history significant of T2DM, hypertension, hyperlipidemia, ESRD on HD (TTS) seizure disorder, CAD who presents to the emergency department via EMS due to nausea and vomiting which started  the day before the admission.  Patient has chronic confusion, patient missed her dialysis  on Saturday, due to not feeling well. On arrival to ED, she was found be anemic.  CT abdomen and pelvis without contrast showed rectosigmoid wall thickening and inflammation compatible with proctocolitis with questionable ascending colitis and questionable gastritis.  Small volume ascites and body wall edema and nonobstructing bilateral renal calculi also noted. CT head without contrast showed no acute intracranial abnormality Patient was treated with IV fentanyl and, Zofran was given, Lokelma was given due to hyperkalemia and she was empirically started on Zosyn due to presumed colitis. Hospitalist was asked to admit patient for further evaluation and management. GI consulted for colitis and hemoccult positive stools and acute blood loss anemia and recommendations to monitor for now.    Assessment & Plan:   Principal Problem:   Colitis Active Problems:   Nausea & vomiting   Seizure disorder (HCC)   Essential hypertension   ESRD on dialysis (HCC)   Hyponatremia   Hyperkalemia   Thrombocytopenia (HCC)   Macrocytic anemia   GI bleed   Elevated troponin   GERD (gastroesophageal reflux disease)   Nausea and vomiting, intermittent diarrhea probably from colitis and possible gastritis.  Symptomatic  management with IV PPI, IV pain control, IV zofran.  Empirically on IV zosyn for colitis.  Started on clears , advance as tolerated. GI consulted  recommendations to monitor for further rectal bleeding, and if no bleeding, will need outpatient EGD and colonoscopy in 4 to 6 weeks.  Blood cultures done and pending.  She reports nausea, no vomiting, mild generalized abdominal pain, 2 loose bowel movements.  C diff  pcr is negative. GI pathogen pcr is pending.    Acute anemia of blood loss Suspect GI blood loss as she was found to have hemoccult positive stools.  GI consulted and recommendations given.  Transfuse to keep hemoglobin greater than 7. Baseline hemoglobin between 9 to 10.  Hemoglobin 8.4 this morning.     Hyperkalemia:  Resolved.  From ESRD and missing HD. No EKG changes, no chest pain.  On Lokelma.    ESRD on TTHS Missed sat HD due to not feeling well.  Nephrology on board.     Mild Elevated troponins from type II demand ischemia EKG doe snot show any ischemic changes.  Pt denies any chest pain.    Seizure disorder  Resume Keppra.    GERD Resume PPI.    Macrocytic anemia:  Anemia panel showing iron levels at 39, ferritin at 1582, folate is >40 and vit b 12 is 1598.    Mild thrombocytopenia:  Continue to monitor.    Type 2 MD,  Controlled CBG's CBG (last 3)  Recent Labs    10/13/21 1706 10/13/21 2029 10/14/21 0737  GLUCAP 150* 220* 81    Resume SSI.  Last A1c is 6.4%. non insulin dependent.      DVT prophylaxis: SCD'S Code Status: full code.  Family Communication: (none at bedside).  Disposition:  Status is: Inpatient Remains inpatient appropriate because: IV antibiotics and SNF on discharge.    Level of care: Med-Surg Consultants:  Gastroenterology Nephrology.  Procedures: HD  Antimicrobials:none.    Subjective: Nauseated, no vomiting. Generalized abdominal pain.   Objective: Vitals:   10/13/21 1701 10/13/21 2017 10/14/21 0003 10/14/21 0459  BP: (!) 182/76 (!) 173/62 (!) 156/67 (!) 171/69  Pulse: 68 66 64 64  Resp: 20 20 16 16   Temp: 97.7 F (36.5 C) 97.9 F  (36.6 C)  (!) 97.5 F (36.4 C)  TempSrc: Oral   Oral  SpO2: 100% 100% 100% 100%  Weight:      Height:        Intake/Output Summary (Last 24 hours) at 10/14/2021 1151 Last data filed at 10/14/2021 0300 Gross per 24 hour  Intake 1472.19 ml  Output --  Net 1472.19 ml   Filed Weights   10/13/21 1201  Weight: 42.4 kg    Examination:  General exam: Appears calm and comfortable  Respiratory system: Clear to auscultation. Respiratory effort normal. Cardiovascular system: S1 & S2 heard, RRR. No JVD, No pedal edema. Gastrointestinal system: Abdomen is soft, tender generalized. Bowel sounds wnl.  Central nervous system: Alert and oriented. No focal neurological deficits. Extremities: Symmetric 5 x 5 power. Skin: No rashes seen.  Psychiatry:  Mood & affect appropriate.      Data Reviewed: I have personally reviewed following labs and imaging studies  CBC: Recent Labs  Lab 10/12/21 2001 10/13/21 0517 10/14/21 0744  WBC 5.2 4.2 3.6*  NEUTROABS 3.2  --   --   HGB 9.3* 8.4* 8.4*  HCT 29.6* 27.2* 27.7*  MCV 101.7* 101.9* 106.1*  PLT 114* 113* 104*     Basic Metabolic Panel: Recent Labs  Lab 10/12/21 2001 10/13/21 0517 10/14/21 0744  NA 133* 135 135  K 5.3* 5.2* 4.8  CL 98 101 102  CO2 26 27 23   GLUCOSE 170* 136* 73  BUN 35* 38* 42*  CREATININE 5.73* 6.18* 6.54*  CALCIUM 10.3 9.7 9.2  MG  --  2.6*  --   PHOS  --  1.5*  --      GFR: Estimated Creatinine Clearance: 5.7 mL/min (A) (by C-G formula based on SCr of 6.54 mg/dL (H)).  Liver Function Tests: Recent Labs  Lab 10/12/21 2001 10/13/21 0517 10/14/21 0744  AST 31 26 24   ALT 35 29 28  ALKPHOS 253* 215* 205*  BILITOT 0.9 0.9 0.8  PROT 6.4* 5.3* 5.3*  ALBUMIN 3.2* 2.6* 2.5*     CBG: Recent Labs  Lab 10/13/21 0759 10/13/21 1239 10/13/21 1706 10/13/21 2029 10/14/21 0737  GLUCAP 138* 126* 150* 220* 81      Recent Results (from the past 240 hour(s))  Resp Panel by RT-PCR (Flu A&B, Covid)  Anterior Nasal Swab     Status: None   Collection Time: 10/14/21  1:30 AM   Specimen: Anterior Nasal Swab  Result Value Ref Range Status   SARS Coronavirus 2 by RT PCR NEGATIVE NEGATIVE Final    Comment: (NOTE) SARS-CoV-2 target nucleic acids are NOT DETECTED.  The SARS-CoV-2 RNA is generally detectable in upper respiratory specimens during the acute phase of infection. The lowest concentration of SARS-CoV-2 viral copies this assay can detect is 138 copies/mL. A negative result does not preclude SARS-Cov-2 infection and should not be used as the sole basis for treatment or other patient management decisions. A negative result may occur with  improper specimen collection/handling, submission of specimen other  than nasopharyngeal swab, presence of viral mutation(s) within the areas targeted by this assay, and inadequate number of viral copies(<138 copies/mL). A negative result must be combined with clinical observations, patient history, and epidemiological information. The expected result is Negative.  Fact Sheet for Patients:  EntrepreneurPulse.com.au  Fact Sheet for Healthcare Providers:  IncredibleEmployment.be  This test is no t yet approved or cleared by the [place] FDA and  has been authorized for detection and/or diagnosis of SARS-CoV-2 by FDA under an Emergency Use Authorization (EUA). This EUA will remain  in effect (meaning this test can be used) for the duration of the COVID-19 declaration under Section 564(b)(1) of the Act, 21 U.S.C.section 360bbb-3(b)(1), unless the authorization is terminated  or revoked sooner.       Influenza A by PCR NEGATIVE NEGATIVE Final   Influenza B by PCR NEGATIVE NEGATIVE Final    Comment: (NOTE) The Xpert Xpress SARS-CoV-2/FLU/RSV plus assay is intended as an aid in the diagnosis of influenza from Nasopharyngeal swab specimens and should not be used as a sole basis for treatment. Nasal washings  and aspirates are unacceptable for Xpert Xpress SARS-CoV-2/FLU/RSV testing.  Fact Sheet for Patients: EntrepreneurPulse.com.au  Fact Sheet for Healthcare Providers: IncredibleEmployment.be  This test is not yet approved or cleared by the [place] FDA and has been authorized for detection and/or diagnosis of SARS-CoV-2 by FDA under an Emergency Use Authorization (EUA). This EUA will remain in effect (meaning this test can be used) for the duration of the COVID-19 declaration under Section 564(b)(1) of the Act, 21 U.S.C. section 360bbb-3(b)(1), unless the authorization is terminated or revoked.  Performed at [facility], [address], [place], [place] [address]   MRSA Next Gen by PCR, Nasal     Status: None   Collection Time: 10/14/21  1:30 AM   Specimen: Nasal Mucosa; Nasal Swab  Result Value Ref Range Status   MRSA by PCR Next Gen NOT DETECTED NOT DETECTED Final    Comment: (NOTE) The GeneXpert MRSA Assay (FDA approved for NASAL specimens only), is one component of a comprehensive MRSA colonization surveillance program. It is not intended to diagnose MRSA infection nor to guide or monitor treatment for MRSA infections. Test performance is not FDA approved in patients less than 58 years old. Performed at [facility], [address], [place], [place] [address]          Radiology Studies: DG Chest Portable 1 View  Result Date: 10/12/2021 CLINICAL DATA:  Nausea and vomiting. EXAM: PORTABLE CHEST 1 VIEW COMPARISON:  August 24, 2021 FINDINGS: The cardiac silhouette is borderline in size. There is no evidence of acute infiltrate, pleural effusion or pneumothorax. The visualized skeletal structures are unremarkable. IMPRESSION: No active cardiopulmonary disease. Electronically Signed   By: Virgina Norfolk M.D.   On: 10/12/2021 19:04   CT ABDOMEN PELVIS WO CONTRAST  Result Date: 10/12/2021 CLINICAL DATA:  Nausea and vomiting. EXAM: CT  ABDOMEN AND PELVIS WITHOUT CONTRAST TECHNIQUE: Multidetector CT imaging of the abdomen and pelvis was performed following the standard protocol without IV contrast. RADIATION DOSE REDUCTION: This exam was performed according to the departmental dose-optimization program which includes automated exposure control, adjustment of the mA and/or kV according to patient size and/or use of iterative reconstruction technique. COMPARISON:  CT abdomen and pelvis 08/15/2020. FINDINGS: Lower chest: No acute abnormality.  Heart is enlarged. Hepatobiliary: No focal liver abnormality is seen. Status post cholecystectomy. No biliary dilatation. Pancreas: Pancreas is diffusely atrophic with coarse calcifications compatible with  chronic pancreatitis, unchanged. No acute inflammatory changes. Spleen: Normal in size without focal abnormality. Adrenals/Urinary Tract: There is some punctate nonobstructing bilateral renal calculi. Prominent renal vascular calcifications are again seen. There is no hydronephrosis. No ureteral calculi are seen. The adrenal glands are within normal limits. The bladder is under distended and not well evaluated. Stomach/Bowel: There is rectosigmoid wall thickening with mild surrounding inflammation. No dilated bowel loops, pneumatosis or free air. There is questionable wall thickening of the ascending colon. Small bowel is within normal limits. The stomach is decompressed. Gastric wall thickening not excluded. Vascular/Lymphatic: No significant vascular findings are present. No enlarged abdominal or pelvic lymph nodes. Extensive peripheral vascular calcifications are present. Reproductive: Uterus and bilateral adnexa are unremarkable. Other: Small volume ascites.  Body wall edema.  No focal hernia. Musculoskeletal: The bones are osteopenic. Chronic compression deformities of T10, T12 and L2 appear similar to the prior study. Right hip arthroplasty is similar to the prior study. IMPRESSION: 1. Rectosigmoid wall  thickening and inflammation compatible with proctocolitis. 2. Questionable ascending colitis. 3. Questionable gastritis. 4. Small volume ascites and body wall edema. 5. Nonobstructing bilateral renal calculi. 6.  Aortic Atherosclerosis (ICD10-I70.0). Electronically Signed   By: Ronney Asters M.D.   On: 10/12/2021 19:03   CT Head Wo Contrast  Result Date: 10/12/2021 CLINICAL DATA:  Mental status change EXAM: CT HEAD WITHOUT CONTRAST TECHNIQUE: Contiguous axial images were obtained from the base of the skull through the vertex without intravenous contrast. RADIATION DOSE REDUCTION: This exam was performed according to the departmental dose-optimization program which includes automated exposure control, adjustment of the mA and/or kV according to patient size and/or use of iterative reconstruction technique. COMPARISON:  Head CT dated August 21, 2021 FINDINGS: Brain: No evidence of acute infarction, hemorrhage, hydrocephalus, extra-axial collection or mass lesion/mass effect. Vascular: No hyperdense vessel or unexpected calcification. Skull: Normal. Negative for fracture or focal lesion. Sinuses/Orbits: No acute finding. Other: None. IMPRESSION: No acute intracranial abnormality. Electronically Signed   By: Yetta Glassman M.D.   On: 10/12/2021 18:57        Scheduled Meds:  calcitRIOL  0.25 mcg Oral Q T,Th,Sa-HD   carvedilol  12.5 mg Oral BID   Chlorhexidine Gluconate Cloth  6 each Topical Q0600   feeding supplement  1 Container Oral TID BM   hydrALAZINE  25 mg Oral Q8H   insulin aspart  0-5 Units Subcutaneous QHS   insulin aspart  0-6 Units Subcutaneous TID WC   levETIRAcetam  500 mg Oral QHS   nicotine  14 mg Transdermal Daily   pantoprazole (PROTONIX) IV  40 mg Intravenous Q12H   Continuous Infusions:  piperacillin-tazobactam (ZOSYN)  IV 2.25 g (10/14/21 0854)     LOS: 2 days    Time spent:35 minutes.     Hosie Poisson, MD [facility]   To contact the attending provider  between 7A-7P or the covering provider during after hours 7P-7A, please log into the web site [email or web address] and access using universal [place] password for that web site. If you do not have the password, please call the hospital operator.  10/14/2021, 11:51 AM

## 2021-10-14 NOTE — TOC Initial Note (Signed)
Transition of Care [facility]) - Initial/Assessment Note    Patient Details  Name: Erica [name] MRN: [id number] Date of Birth: [birth year]  Transition of Care [facility]) CM/SW Contact:    Ihor Gully, LCSW Phone Number: 10/14/2021, 4:58 PM  Clinical Narrative:                 Patient from home with son. Uses wc, son helps with transfers. On HD at [facility] TTS schedule. Uses transportation van to HD. PT recommends SNF. Patient is agreeable to SNF. Referral sent to facility of choice.   Expected Discharge Plan: Skilled Nursing Facility Barriers to Discharge: Continued Medical Work up   Patient Goals and CMS Choice        Expected Discharge Plan and Services Expected Discharge Plan: [place] Acute Care Choice: [place] arrangements for the past 2 months: Single Family Home                                      Prior Living Arrangements/Services Living arrangements for the past 2 months: Single Family Home Lives with:: Adult Children Patient language and need for interpreter reviewed:: Yes Do you feel safe going back to the place where you live?: Yes      Need for Family Participation in Patient Care: Yes (Comment) Care giver support system in place?: Yes (comment) Current home services: DME (rollator, cane, wc, 3n1) Criminal Activity/Legal Involvement Pertinent to Current Situation/Hospitalization: No - Comment as needed  Activities of Daily Living Home Assistive Devices/Equipment: Wheelchair, Bedside commode/3-in-1 ADL Screening (condition at time of admission) Patient's cognitive ability adequate to safely complete daily activities?: Yes Is the patient deaf or have difficulty hearing?: No Does the patient have difficulty seeing, even when wearing glasses/contacts?: No Does the patient have difficulty concentrating, remembering, or making decisions?: No Patient able to express need for assistance with ADLs?:  Yes Does the patient have difficulty dressing or bathing?: Yes Independently performs ADLs?: No Communication: Independent Dressing (OT): Needs assistance Is this a change from baseline?: Pre-admission baseline Grooming: Needs assistance Is this a change from baseline?: Pre-admission baseline Feeding: Independent Bathing: Needs assistance Is this a change from baseline?: Pre-admission baseline Toileting: Needs assistance Is this a change from baseline?: Pre-admission baseline In/Out Bed: Needs assistance Is this a change from baseline?: Pre-admission baseline Walks in Home: Needs assistance Is this a change from baseline?: Pre-admission baseline Does the patient have difficulty walking or climbing stairs?: Yes Weakness of Legs: Both Weakness of Arms/Hands: Both  Permission Sought/Granted                  Emotional Assessment     Affect (typically observed): Appropriate Orientation: : Oriented to Self, Oriented to Place, Oriented to  Time, Oriented to Situation Alcohol / Substance Use: Not Applicable Psych Involvement: No (comment)  Admission diagnosis:  Colitis [K52.9] Patient Active Problem List   Diagnosis Date Noted   Macrocytic anemia 10/13/2021   GI bleed 10/13/2021   Elevated troponin 10/13/2021   GERD (gastroesophageal reflux disease) 10/13/2021   Colitis 10/12/2021   Atrial fibrillation ([place])    Hypothermia 06/19/2021   Abnormal EKG 06/19/2021   Right hip pain    CAD (coronary artery disease) 09/17/2020   Altered mental status    Acute metabolic encephalopathy 02/63/7858   Diarrhea    Pressure injury of skin 06/09/2020  Sacral fracture ([place]) 06/08/2020   Thrombocytopenia ([place]) 06/08/2020   Hypertensive urgency 06/08/2020   (HFpEF) heart failure with preserved ejection fraction ([place]) 06/08/2020   Fall 06/08/2020   Elevated MCV 06/08/2020   Palliative care by specialist    Generalized weakness 11/14/2019   HCAP (healthcare-associated pneumonia)  11/14/2019   Hyperkalemia 11/13/2019   Hyponatremia 09/27/2019   Closed fracture of right proximal humerus 04/20/2019   Surgery, elective    Seizure ([place]) 11/12/2018   History of hydronephrosis --stents in place 12/08/2017   Stroke ([place]) 11/29/2017   Seizures ([place])    Hydronephrosis    Recurrent UTI    Diabetes mellitus type 2 in nonobese [facility])    ESRD on dialysis ([place])    Anemia    Chronic diastolic congestive heart failure ([place])    Essential hypertension    PICC (peripherally inserted central catheter) in place    Acute pyelonephritis 11/12/2017   Uncontrolled type 2 diabetes mellitus with hyperglycemia, with long-term current use of insulin ([place]) 11/10/2017   Renal failure 11/08/2017   Seizure disorder ([place]) 11/08/2017   Orthostatic hypotension 07/25/2014   Moderate protein malnutrition (HCC) 09/15/2013   Aspiration pneumonia ([place]) 09/15/2013   Acute respiratory failure requiring reintubation ([place]) 09/11/2013   probable Seizures due to metabolic disorder 11/57/2620   Lactic acidosis 03/19/2013   Abdominal pain 03/19/2013   Rotavirus infection 10/29/2012   Diabetes mellitus with end-stage renal disease ([place]) 10/29/2012   Protein-calorie malnutrition, severe ([place]) 10/27/2012   NSTEMI (non-ST elevated myocardial infarction) ([place]) 10/26/2012   Fever, unspecified 10/26/2012   Hypotension 35/59/7416   Metabolic acidosis 38/45/3646   Chronic diarrhea 10/25/2012   Tobacco abuse 10/25/2012   DKA (diabetic ketoacidoses) 09/09/2012   Dehydration 09/09/2012   DKA, type 2 ([place]) 05/20/2012   Abnormal LFTs 05/20/2012   Heart murmur, systolic 80/32/1224   Hypoglycemia 82/50/0370   Metabolic encephalopathy 48/88/9169   Alcohol abuse 07/08/2011   Hypokalemia 07/08/2011   Nausea & vomiting 07/08/2011   H/O chronic pancreatitis 07/08/2011   PCP:  System, Provider Not In Pharmacy:   [place] [place]  [address] Phone: [phone] Fax: [place], [place] - [address] [place] [address] Phone: [phone] Fax: [place], [place] - [address] [place] [address] Phone: [phone] Fax: [phone]     Social Determinants of Health (SDOH) Interventions    Readmission Risk Interventions    08/20/2020   11:24 AM 06/11/2020   12:31 PM 11/19/2019   11:36 AM  Readmission Risk Prevention Plan  Transportation Screening Complete Complete Complete  Medication Review Press photographer) Complete Complete Complete  PCP or Specialist appointment within 3-5 days of discharge Complete  Complete  HRI or Home Care Consult Complete Complete Complete  SW Recovery Care/Counseling Consult Complete Complete Complete  Palliative Care Screening Not Applicable Not Applicable Complete  Skilled Nursing Facility Complete Complete Complete

## 2021-10-15 DIAGNOSIS — I10 Essential (primary) hypertension: Secondary | ICD-10-CM | POA: Diagnosis not present

## 2021-10-15 DIAGNOSIS — R778 Other specified abnormalities of plasma proteins: Secondary | ICD-10-CM | POA: Diagnosis not present

## 2021-10-15 DIAGNOSIS — N186 End stage renal disease: Secondary | ICD-10-CM | POA: Diagnosis not present

## 2021-10-15 DIAGNOSIS — K529 Noninfective gastroenteritis and colitis, unspecified: Secondary | ICD-10-CM | POA: Diagnosis not present

## 2021-10-15 LAB — CBC WITH DIFFERENTIAL/PLATELET
Abs Immature Granulocytes: 0.01 10*3/uL (ref 0.00–0.07)
Basophils Absolute: 0 10*3/uL (ref 0.0–0.1)
Basophils Relative: 1 %
Eosinophils Absolute: 0.1 10*3/uL (ref 0.0–0.5)
Eosinophils Relative: 3 %
HCT: 27.6 % — ABNORMAL LOW (ref 36.0–46.0)
Hemoglobin: 8.3 g/dL — ABNORMAL LOW (ref 12.0–15.0)
Immature Granulocytes: 0 %
Lymphocytes Relative: 30 %
Lymphs Abs: 1.1 10*3/uL (ref 0.7–4.0)
MCH: 31.6 pg (ref 26.0–34.0)
MCHC: 30.1 g/dL (ref 30.0–36.0)
MCV: 104.9 fL — ABNORMAL HIGH (ref 80.0–100.0)
Monocytes Absolute: 0.6 10*3/uL (ref 0.1–1.0)
Monocytes Relative: 15 %
Neutro Abs: 1.8 10*3/uL (ref 1.7–7.7)
Neutrophils Relative %: 51 %
Platelets: 110 10*3/uL — ABNORMAL LOW (ref 150–400)
RBC: 2.63 MIL/uL — ABNORMAL LOW (ref 3.87–5.11)
RDW: 15.2 % (ref 11.5–15.5)
WBC: 3.6 10*3/uL — ABNORMAL LOW (ref 4.0–10.5)
nRBC: 0 % (ref 0.0–0.2)

## 2021-10-15 LAB — COMPREHENSIVE METABOLIC PANEL
ALT: 27 U/L (ref 0–44)
AST: 23 U/L (ref 15–41)
Albumin: 2.5 g/dL — ABNORMAL LOW (ref 3.5–5.0)
Alkaline Phosphatase: 192 U/L — ABNORMAL HIGH (ref 38–126)
Anion gap: 8 (ref 5–15)
BUN: 18 mg/dL (ref 8–23)
CO2: 26 mmol/L (ref 22–32)
Calcium: 8 mg/dL — ABNORMAL LOW (ref 8.9–10.3)
Chloride: 106 mmol/L (ref 98–111)
Creatinine, Ser: 3.53 mg/dL — ABNORMAL HIGH (ref 0.44–1.00)
GFR, Estimated: 14 mL/min — ABNORMAL LOW (ref >60)
Glucose, Bld: 246 mg/dL — ABNORMAL HIGH (ref 70–99)
Potassium: 3.7 mmol/L (ref 3.5–5.1)
Sodium: 140 mmol/L (ref 135–145)
Total Bilirubin: 0.7 mg/dL (ref 0.3–1.2)
Total Protein: 5.2 g/dL — ABNORMAL LOW (ref 6.5–8.1)

## 2021-10-15 LAB — GLUCOSE, CAPILLARY
Glucose-Capillary: 254 mg/dL — ABNORMAL HIGH (ref 70–99)
Glucose-Capillary: 234 mg/dL — ABNORMAL HIGH (ref 70–99)
Glucose-Capillary: 187 mg/dL — ABNORMAL HIGH (ref 70–99)
Glucose-Capillary: 176 mg/dL — ABNORMAL HIGH (ref 70–99)
Glucose-Capillary: 131 mg/dL — ABNORMAL HIGH (ref 70–99)

## 2021-10-15 LAB — GASTROINTESTINAL PANEL BY PCR, STOOL (REPLACES STOOL CULTURE)

## 2021-10-15 LAB — PHOSPHORUS: Phosphorus: 2.1 mg/dL — ABNORMAL LOW (ref 2.5–4.6)

## 2021-10-15 LAB — HEPATITIS B SURFACE ANTIBODY, QUANTITATIVE: Hep B S AB Quant (Post): 141.6 m[IU]/mL (ref >9.9)

## 2021-10-15 LAB — GAMMA GT: GGT: 91 U/L — ABNORMAL HIGH (ref 7–50)

## 2021-10-15 LAB — MAGNESIUM: Magnesium: 2.1 mg/dL (ref 1.7–2.4)

## 2021-10-15 MED ORDER — HEPARIN SODIUM (PORCINE) 5000 UNIT/ML IJ SOLN
5000.0000 [IU] | Freq: Three times a day (TID) | INTRAMUSCULAR | Status: DC
  Administered 2021-10-15 – 2021-10-19 (×10): 5000 [IU] via SUBCUTANEOUS
  Filled 2021-10-15 (×11): qty 1

## 2021-10-15 NOTE — Progress Notes (Signed)
Physical Therapy Treatment Patient Details Name: Erica [name] MRN: [id number] DOB: 1955/02/23 Today's Date: 10/15/2021   History of Present Illness Erica [name] is a 66 y.o. female with medical history significant of T2DM, hypertension, hyperlipidemia, ESRD on HD (TTS) seizure disorder, CAD who presents to the emergency department via EMS due to nausea and vomiting which started yesterday, she vomited again this morning after breakfast.  Vomitus was nonbloody and she denies abdominal pain.  Patient has chronic confusion, patient missed her dialysis yesterday due to not feeling well.  She denies chest pain, shortness of breath, fever, chills, diarrhea.    PT Comments    Pt in bed, willing to work with therapy today.  Overall labored, weak movement.  Min assist and cues to transfer to edge of bed.  Noted loose BM in bed/on patient.  Total assist to complete hygiene while standing.  Pt with min assist to stand and noted weakness with gait, mod assist for safety using walker.  Pt able to make 4 steps over to recliner and positioned for comfort.  Noted fatigue following minimal activity.    Recommendations for follow up therapy are one component of a multi-disciplinary discharge planning process, led by the attending physician.  Recommendations may be updated based on patient status, additional functional criteria and insurance authorization.  Follow Up Recommendations  Skilled nursing-short term rehab (<3 hours/day) Can patient physically be transported by private vehicle: No   Assistance Recommended at Discharge Frequent or constant Supervision/Assistance  Patient can return home with the following A lot of help with bathing/dressing/bathroom;A lot of help with walking and/or transfers;Assistance with cooking/housework;Assist for transportation   Equipment Recommendations  None recommended by PT    Recommendations for Other Services       Precautions / Restrictions  Precautions Precautions: Fall Restrictions Weight Bearing Restrictions: No     Mobility  Bed Mobility Overal bed mobility: Needs Assistance Bed Mobility: Supine to Sit, Sit to Supine     Supine to sit: Min assist Sit to supine: Mod assist   General bed mobility comments: assist to pull to seated EOB and assist for BLE back into bed    Transfers Overall transfer level: Needs assistance Equipment used: Rolling walker (2 wheels) Transfers: Sit to/from Stand Sit to Stand: Mod assist           General transfer comment: labored movement and very quick to fatigue and return to bed    Ambulation/Gait Ambulation/Gait assistance: Mod assist Gait Distance (Feet): 4 Feet Assistive device: Rolling walker (2 wheels) Gait Pattern/deviations: Trunk flexed, Narrow base of support, Decreased stride length                  Cognition Arousal/Alertness: Awake/alert Behavior During Therapy: WFL for tasks assessed/performed Overall Cognitive Status: Within Functional Limits for tasks assessed                                                 Pertinent Vitals/Pain Pain Assessment Pain Assessment: No/denies pain     PT Goals (current goals can now be found in the care plan section) Acute Rehab PT Goals Patient Stated Goal: get stronger PT Goal Formulation: With patient Time For Goal Achievement: 10/27/21 Potential to Achieve Goals: Fair Progress towards PT goals: Progressing toward goals    Frequency    Min 3X/week  AM-PAC PT "6 Clicks" Mobility   Outcome Measure  Help needed turning from your back to your side while in a flat bed without using bedrails?: None Help needed moving from lying on your back to sitting on the side of a flat bed without using bedrails?: A Little Help needed moving to and from a bed to a chair (including a wheelchair)?: A Lot Help needed standing up from a chair using your arms (e.g., wheelchair or bedside  chair)?: A Lot Help needed to walk in hospital room?: A Lot Help needed climbing 3-5 steps with a railing? : Total 6 Click Score: 14    End of Session Equipment Utilized During Treatment: Gait belt Activity Tolerance: Patient limited by fatigue Patient left: in chair;with call bell/phone within reach Nurse Communication: Mobility status PT Visit Diagnosis: Unsteadiness on feet (R26.81);Other abnormalities of gait and mobility (R26.89);Muscle weakness (generalized) (M62.81)     Time: 1747-1595 PT Time Calculation (min) (ACUTE ONLY): 25 min  Charges:  $Gait Training: 8-22 mins $Therapeutic Exercise: 8-22 mins                     Teena Irani, PTA/CLT [place] Ph: [phone]  Teena Irani 10/15/2021, 10:16 AM

## 2021-10-15 NOTE — Progress Notes (Signed)
Erica [name] NIO:[id number] DOB: 02-Mar-1955 DOA: 10/12/2021 PCP: System, Provider Not In   Subj: 66 y.o. BF PMHx DM type II, HTN, HLD, ESRD on HD (TTS) seizure disorder, CAD    Presents to the emergency department via EMS due to nausea and vomiting which started  the day before the admission.  Patient has chronic confusion, patient missed her dialysis  on Saturday, due to not feeling well. On arrival to ED, she was found be anemic.  CT abdomen and pelvis without contrast showed rectosigmoid wall thickening and inflammation compatible with proctocolitis with questionable ascending colitis and questionable gastritis.  Small volume ascites and body wall edema and nonobstructing bilateral renal calculi also noted. CT head without contrast showed no acute intracranial abnormality Patient was treated with IV fentanyl and, Zofran was given, Lokelma was given due to hyperkalemia and she was empirically started on Zosyn due to presumed colitis. Hospitalist was asked to admit patient for further evaluation and management. GI consulted for colitis and hemoccult positive stools and acute blood loss anemia and recommendations to monitor for now.    Obj: 8/30 A/O x4.  Sitting in the chair comfortably.  States negative postprandial abdominal pain   Objective: VITAL SIGNS: Temp: 97.2 F (36.2 C) (08/30 0341) BP: 140/62 (08/30 0852) Pulse Rate: 83 (08/30 0852) SPO2; FIO2:   Intake/Output Summary (Last 24 hours) at 10/15/2021 1323 Last data filed at 10/15/2021 0721 Gross per 24 hour  Intake 480 ml  Output 2000 ml  Net -1520 ml     Exam: General: A/O x4, No acute respiratory distress, cachectic Lungs: Clear to auscultation bilaterally without wheezes or crackles Cardiovascular: Regular rate and rhythm without murmur gallop or rub normal S1 and S2 Abdomen: Nontender, nondistended, soft, bowel sounds positive, no rebound, no ascites, no appreciable mass Extremities: No significant cyanosis,  clubbing, or edema bilateral lower extremities Skin: Negative rashes, lesions, ulcers Psychiatric:  Negative depression, negative anxiety, negative fatigue, negative mania  Central nervous system:  Cranial nerves II through XII intact, tongue/uvula midline, all extremities muscle strength 5/5, sensation intact throughout, negative dysarthria, negative expressive aphasia, negative receptive aphasia.    Mobility Assessment (last 72 hours)     Mobility Assessment     Row Name 10/15/21 1014 10/14/21 1000 10/13/21 1403 10/13/21 1230     Does patient have an order for bedrest or is patient medically unstable -- No - Continue assessment -- No - Continue assessment    What is the highest level of mobility based on the progressive mobility assessment? Level 3 (Stands with assist) - Balance while standing  and cannot march in place Level 3 (Stands with assist) - Balance while standing  and cannot march in place Level 3 (Stands with assist) - Balance while standing  and cannot march in place Level 2 (Chairfast) - Balance while sitting on edge of bed and cannot stand    Is the above level different from baseline mobility prior to current illness? -- Yes - Recommend PT order -- Yes - Recommend PT order               DVT prophylaxis: Subcu heparin Code Status: Full Family Communication:  Status is: Inpatient    Dispo: The patient is from: Home              Anticipated d/c is to: Home              Anticipated d/c date is: 2 days  Patient currently is not medically stable to d/c.      Consultants:     Cultures   Antimicrobials: Anti-infectives (From admission, onward)    Start     Dose/Rate Route Frequency Ordered Stop   10/13/21 0900  piperacillin-tazobactam (ZOSYN) 2.25 g in sodium chloride 0.9 % 50 mL IVPB        2.25 g 100 mL/hr over 30 Minutes Intravenous Every 8 hours 10/13/21 0807     10/13/21 0600  piperacillin-tazobactam (ZOSYN) IVPB 2.25 g  Status:   Discontinued        2.25 g 100 mL/hr over 30 Minutes Intravenous Every 8 hours 10/12/21 2249 10/13/21 0806   10/12/21 2300  piperacillin-tazobactam (ZOSYN) IVPB 3.375 g        3.375 g 100 mL/hr over 30 Minutes Intravenous  Once 10/12/21 2250 10/12/21 2340        A/P  Nausea and vomiting, intermittent diarrhea probably from colitis and possible gastritis.  Symptomatic  management with IV PPI, IV pain control, IV zofran.  Empirically on IV zosyn for colitis.  Started on clears , advance as tolerated. GI consulted recommendations to monitor for further rectal bleeding, and if no bleeding, will need outpatient EGD and colonoscopy in 4 to 6 weeks.  Blood cultures done and pending.  She reports nausea, no vomiting, mild generalized abdominal pain, 2 loose bowel movements.  C diff  pcr is negative. GI pathogen pcr is pending.      Acute anemia of blood loss Suspect GI blood loss as she was found to have hemoccult positive stools.  GI consulted and recommendations given.  -Transfuse for hemoglobin <7 Lab Results  Component Value Date   HGB 8.3 (L) 10/15/2021   HGB 8.4 (L) 10/14/2021   HGB 8.4 (L) 10/13/2021   HGB 9.3 (L) 10/12/2021   HGB 11.2 (L) 09/23/2021      Hyperkalemia:  -Secondary to missing ESRD - Resolved.   ESRD HD T/TH/Sat -Missed sat HD due to not feeling well.  -Nephrology on board.    Mild Elevated troponins from type II demand ischemia EKG doe snot show any ischemic changes.  Pt denies any chest pain.      Seizure disorder  -Keppra 500 mg daily     GERD Resume PPI.      Macrocytic anemia:  -Anemia panel showing iron levels at 39, ferritin at 1582, folate is >40 and vit b 12 is 1598.    Mild Thrombocytopenia:  Continue to monitor.      DM type II controlled with Hyperglycemia on long-term insulin -8/30 A1c pending - 8/30 lipid panel pending -        Care during the described time interval was provided by me .  I have reviewed this  patient's available data, including medical history, events of note, physical examination, and all test results as part of my evaluation.

## 2021-10-15 NOTE — Progress Notes (Signed)
Subjective: Patient denies any abdominal pain, she is having some nausea but was able to tolerate liquid diet, she has requested another bowl of chicken broth as she states she is hungry. She endorses 2 episodes of liquid stools this morning.   Objective: Vital signs in last 24 hours: Temp:  [97.2 F (36.2 C)-98.4 F (36.9 C)] 97.2 F (36.2 C) (08/30 0341) Pulse Rate:  [63-83] 83 (08/30 0852) Resp:  [16-20] 16 (08/30 0341) BP: (117-169)/(50-75) 140/62 (08/30 0852) SpO2:  [98 %-100 %] 98 % (08/30 0341) Weight:  [40 kg-42.1 kg] 40 kg (08/29 1640) Last BM Date :  (PTA) General:   Alert and oriented, pleasant Head:  Normocephalic and atraumatic. Eyes:  No icterus, sclera clear. Conjuctiva pink.  Mouth:  Without lesions, mucosa pink and moist.  Heart:  S1, S2 present, no murmurs noted.  Lungs: Clear to auscultation bilaterally, without wheezing, rales, or rhonchi.  Abdomen:  Bowel sounds present, soft, non-distended. Abdomen is diffusely tender. No HSM or hernias noted. No rebound or guarding. No masses appreciated  Msk:  Symmetrical without gross deformities. Normal posture. Pulses:  Normal pulses noted. Extremities:  Without clubbing or edema. Neurologic:  Alert and  oriented x4;  grossly normal neurologically. Skin:  Warm and dry, intact without significant lesions.  Psych:  Alert and cooperative. Normal mood and affect.  Intake/Output from previous day: 08/29 0701 - 08/30 0700 In: 480 [P.O.:480] Out: 2000  Intake/Output this shift: Total I/O In: 240 [P.O.:240] Out: -   Lab Results: Recent Labs    10/13/21 0517 10/14/21 0744 10/15/21 0748  WBC 4.2 3.6* 3.6*  HGB 8.4* 8.4* 8.3*  HCT 27.2* 27.7* 27.6*  PLT 113* 104* 110*   BMET Recent Labs    10/13/21 0517 10/14/21 0744 10/15/21 0748  NA 135 135 140  K 5.2* 4.8 3.7  CL 101 102 106  CO2 27 23 26   GLUCOSE 136* 73 246*  BUN 38* 42* 18  CREATININE 6.18* 6.54* 3.53*  CALCIUM 9.7 9.2 8.0*   LFT Recent Labs     10/13/21 0517 10/14/21 0744 10/15/21 0748  PROT 5.3* 5.3* 5.2*  ALBUMIN 2.6* 2.5* 2.5*  AST 26 24 23   ALT 29 28 27   ALKPHOS 215* 205* 192*  BILITOT 0.9 0.8 0.7   PT/INR No results for input(s): "LABPROT", "INR" in the last 72 hours. Hepatitis Panel Recent Labs    10/14/21 1144  HEPBSAG NON REACTIVE  HCVAB NON REACTIVE    Assessment: Erica [name] is a 66 year old female  with a history of DM, HTN, hyperlipidemia, ESRD on dialysis, chronic anemia, history of chronic pancreatitis, seizures, CAD, presenting to the ED yesterday with acute N/V, abdominal pain, and diarrhea. CT abd/pelvis without contrast showed proctocolitis, questionable ascending colitis, possible gastritis. Hgb 9.3 on admission, previously 11.2 several weeks ago. Heme positive stool. GI consulted due to heme positive stool and anemia.  Anemia: heme positive stool. Hgb 9.3 on admission, 8.3 today. Baseline around 11. No overt GI bleeding.  Iron, B12 and folate WNL.Plan for outpatient EGD/Colonoscopy unless overt bleeding or significant decline in hgb occurs.   Colitis:CT abd/pelvis without contrast showed proctocolitis, questionable ascending colitis. Likely acute gastroenteritis as she had n/v/d. Currently on empiric antibiotics. Tolerating clear liquids, will try to advance to soft diet today. Plan for outpatient colonoscopy in about 6-8 weeks. Continues with diarrhea. C diff testing negative. GI pathogen panel in process  Dysphagia: solid food dysphagia, will need EGD as above, with possible dilation at  that time depending on findings. Plan for endoscopic evaluation as outpatient.  Weight loss:  unintentional, around 7lbs. notes poor appetite and loss of taste. Tolerating clear liquids. No post prandial abdominal pain. Further evaluation will be done with outpatient EGD/colonoscopy as above.   Elevated alkaline phosphatase: Hep B serologies and Hep C Ab non reactive no CBD dilation. GGT 91, IgM, AMA in  process  Plan: Follow for GI path results Follow IgM and AMA results Advance to soft diet today Continue empiric abx Monitor h&h and for overt GI bleeding Outpatient EGD/Colonoscopy unless overt GI bleeding or transfusion dependent anemia occurs   LOS: 3 days    10/15/2021, 9:06 AM   Lalaine Overstreet L. Alver Sorrow, MSN, APRN, AGNP-C Adult-Gerontology Nurse Practitioner [facility]

## 2021-10-15 NOTE — Progress Notes (Signed)
Patient ID: Erica [name], female   DOB: 05-29-1955, 66 y.o.   MRN: [id number] S: Feels a little better today.  Tolerated HD well yesterday with 2L UF. O:BP (!) 117/50 (BP Location: Right Arm)   Pulse 78   Temp (!) 97.2 F (36.2 C)   Resp 16   Ht 4\' 11"  (1.499 m)   Wt 40 kg   SpO2 98%   BMI 17.81 kg/m   Intake/Output Summary (Last 24 hours) at 10/15/2021 0841 Last data filed at 10/15/2021 0721 Gross per 24 hour  Intake 720 ml  Output 2000 ml  Net -1280 ml   Intake/Output: I/O last 3 completed shifts: In: 847.2 [P.O.:480; IV Piggyback:367.2] Out: 2000 [Other:2000]  Intake/Output this shift:  Total I/O In: 240 [P.O.:240] Out: -  Weight change: -0.3 kg Gen:NAD CVS: RRR Resp: CTA Abd: +BS, soft, +tenderness to palpation, no guarding/rebound Ext: no edema, LUE AVG +T/B, aneurysmal changes noted.  Recent Labs  Lab 10/12/21 2001 10/13/21 0517 10/14/21 0744  NA 133* 135 135  K 5.3* 5.2* 4.8  CL 98 101 102  CO2 26 27 23   GLUCOSE 170* 136* 73  BUN 35* 38* 42*  CREATININE 5.73* 6.18* 6.54*  ALBUMIN 3.2* 2.6* 2.5*  CALCIUM 10.3 9.7 9.2  PHOS  --  1.5*  --   AST 31 26 24   ALT 35 29 28   Liver Function Tests: Recent Labs  Lab 10/12/21 2001 10/13/21 0517 10/14/21 0744  AST 31 26 24   ALT 35 29 28  ALKPHOS 253* 215* 205*  BILITOT 0.9 0.9 0.8  PROT 6.4* 5.3* 5.3*  ALBUMIN 3.2* 2.6* 2.5*   Recent Labs  Lab 10/12/21 2001  LIPASE 19   No results for input(s): "AMMONIA" in the last 168 hours. CBC: Recent Labs  Lab 10/12/21 2001 10/13/21 0517 10/14/21 0744 10/15/21 0748  WBC 5.2 4.2 3.6* 3.6*  NEUTROABS 3.2  --   --  1.8  HGB 9.3* 8.4* 8.4* 8.3*  HCT 29.6* 27.2* 27.7* 27.6*  MCV 101.7* 101.9* 106.1* 104.9*  PLT 114* 113* 104* 110*   Cardiac Enzymes: No results for input(s): "CKTOTAL", "CKMB", "CKMBINDEX", "TROPONINI" in the last 168 hours. CBG: Recent Labs  Lab 10/14/21 0737 10/14/21 1125 10/14/21 1743 10/14/21 2151 10/15/21 0733  GLUCAP 81  130* 164* 138* 254*    Iron Studies:  Recent Labs    10/13/21 1359  IRON 39  TIBC 131*  FERRITIN 1,582*   Studies/Results: No results found.  (feeding supplement) PROSource Plus  30 mL Oral BID BM   calcitRIOL  0.25 mcg Oral Q T,Th,Sa-HD   carvedilol  12.5 mg Oral BID   Chlorhexidine Gluconate Cloth  6 each Topical Q0600   feeding supplement  1 Container Oral TID BM   hydrALAZINE  50 mg Oral Q8H   insulin aspart  0-5 Units Subcutaneous QHS   insulin aspart  0-6 Units Subcutaneous TID WC   levETIRAcetam  500 mg Oral QHS   multivitamin  1 tablet Oral QHS   nicotine  14 mg Transdermal Daily   pantoprazole (PROTONIX) IV  40 mg Intravenous Q12H    BMET    Component Value Date/Time   NA 135 10/14/2021 0744   K 4.8 10/14/2021 0744   CL 102 10/14/2021 0744   CO2 23 10/14/2021 0744   GLUCOSE 73 10/14/2021 0744   BUN 42 (H) 10/14/2021 0744   CREATININE 6.54 (H) 10/14/2021 0744   CALCIUM 9.2 10/14/2021 0744   GFRNONAA 7 (L) 10/14/2021  [place] 15 (L) 11/18/2019 0742   CBC    Component Value Date/Time   WBC 3.6 (L) 10/15/2021 0748   RBC 2.63 (L) 10/15/2021 0748   HGB 8.3 (L) 10/15/2021 0748   HCT 27.6 (L) 10/15/2021 0748   PLT 110 (L) 10/15/2021 0748   MCV 104.9 (H) 10/15/2021 0748   MCH 31.6 10/15/2021 0748   MCHC 30.1 10/15/2021 0748   RDW 15.2 10/15/2021 0748   LYMPHSABS 1.1 10/15/2021 0748   MONOABS 0.6 10/15/2021 0748   EOSABS 0.1 10/15/2021 0748   BASOSABS 0.0 10/15/2021 0748    Dialysis Orders: Center: [facility] on TTS . EDW 41 Kg HD Bath 2K/3Ca  Time 3:45 Heparin 1000 units. Access LUE AVG BFR 400 DFR 500    Calcitriol 0.25 mcg po/HD Mic[place] 50 mcg IV every 2 weeks   Assessment/Plan:  Colitis - started on IV zosyn, PPI, and zofran  ESRD -  Continue HD on TTS schedule, no heparin.  Hypertension/volume  - stable  Anemia  - likely due to ABLA and guaiac + stool.  Gi consulted and plan for EGD and colonoscopy in 4-6 weeks.  Hbg dropped slightly to 8.3.   transfuse for Hgb <7  Metabolic bone disease -  continue with home meds when able to take po  Nutrition - advance diet per primary  Donetta Potts, MD [facility]

## 2021-10-15 NOTE — Progress Notes (Signed)
Pt with ESRD. Heparin was ordered for DVT px.  Heparin 5000 units TID Rx signs off  Onnie Boer, PharmD, [place], AAHIVP, CPP Infectious Disease Pharmacist 10/15/2021 6:09 PM

## 2021-10-15 NOTE — Patient Instructions (Signed)
Repeat all exercises 10-15 times, 1-2 times per day.  1) Shoulder Protraction    Begin with elbows by your side, slowly "punch" straight out in front of you.      2) Shoulder Flexion  Supine:     Standing:         Begin with arms at your side with thumbs pointed up, slowly raise both arms up and forward towards overhead.               3) Horizontal abduction/adduction  Supine:   Standing:           Begin with arms straight out in front of you, bring out to the side in at "T" shape. Keep arms straight entire time.                 4) Internal & External Rotation   Supine:     Standing:     Stand with elbows at the side and elbows bent 90 degrees. Move your forearms away from your body, then bring back inward toward the body.     5) Shoulder Abduction  Supine:     Standing:       Lying on your back begin with your arms flat on the table next to your side. Slowly move your arms out to the side so that they go overhead, in a jumping jack or snow angel movement.    6) X to V arms (cheerleader move):  Begin with arms straight down, crossed in front of body in an "X". Keeping arms crossed, lift arms straight up overhead. Then spread arms apart into a "V" shape.  Bring back together into x and lower down to starting position.

## 2021-10-15 NOTE — Progress Notes (Signed)
Occupational Therapy Treatment Patient Details Name: Erica [name] MRN: [id number] DOB: October 30, 1955 Today's Date: 10/15/2021   History of present illness Erica [name] is a 66 y.o. female with medical history significant of T2DM, hypertension, hyperlipidemia, ESRD on HD (TTS) seizure disorder, CAD who presents to the emergency department via EMS due to nausea and vomiting which started yesterday, she vomited again this morning after breakfast.  Vomitus was nonbloody and she denies abdominal pain.  Patient has chronic confusion, patient missed her dialysis yesterday due to not feeling well.  She denies chest pain, shortness of breath, fever, chills, diarrhea.   OT comments  Pt agreeable to OT evaluation. Pt participated well in B UE strengthening seated in chair. Pt given handout including exercise of A/ROM completed today.  Pt completed 2 reps of sit to stand prior to the realization that pt had a bowel movement. NT came into room and assisted with peri-care while pt stood with PT. Diaper applied due to pt not knowing when she goes to the bathroom. Pt demonstrates improved endurance from evaluation as seen by 2 reps in a row of sit to stand with min G assist until 3rd rep for peri-care that pt required min A. Pt left in chair with new gown and call bell within reach. Pt will benefit from continued OT in the hospital and recommended venue below to increase strength, balance, and endurance for safe ADL's.    Recommendations for follow up therapy are one component of a multi-disciplinary discharge planning process, led by the attending physician.  Recommendations may be updated based on patient status, additional functional criteria and insurance authorization.    Follow Up Recommendations  Skilled nursing-short term rehab (<3 hours/day)    Assistance Recommended at Discharge Intermittent Supervision/Assistance  Patient can return home with the following  A lot of help with walking and/or  transfers;A lot of help with bathing/dressing/bathroom;Assistance with cooking/housework;Assist for transportation;Help with stairs or ramp for entrance   Equipment Recommendations  None recommended by OT    Recommendations for Other Services      Precautions / Restrictions Precautions Precautions: Fall Restrictions Weight Bearing Restrictions: No       Mobility Bed Mobility                    Transfers Overall transfer level: Needs assistance Equipment used: Rolling walker (2 wheels) Transfers: Sit to/from Stand Sit to Stand: Min guard, Min assist           General transfer comment: labored movement with need for more physical assist to boost form chair on last (3rd) attempt of sit to stand.     Balance Overall balance assessment: Needs assistance Sitting-balance support: Bilateral upper extremity supported, Feet unsupported Sitting balance-Leahy Scale: Good Sitting balance - Comments: seated in chair   Standing balance support: Bilateral upper extremity supported, Reliant on assistive device for balance Standing balance-Leahy Scale: Poor Standing balance comment: using RW                           ADL either performed or assessed with clinical judgement   ADL Overall ADL's : Needs assistance/impaired                 Upper Body Dressing : Set up;Sitting Upper Body Dressing Details (indicate cue type and reason): Pt assited due to IV but likely at level of set up under normal circumstances for upper body dressing seated in the chair.  Toileting- Clothing Manipulation and Hygiene: Maximal assistance;Sit to/from stand Toileting - Clothing Manipulation Details (indicate cue type and reason): Pt had a bowel movement while standing and required max A for peri-care and to don pull ups while standing with RW.              Cognition Arousal/Alertness: Awake/alert Behavior During Therapy: WFL for tasks assessed/performed Overall  Cognitive Status: Within Functional Limits for tasks assessed                                          Exercises Exercises: General Upper Extremity, Other exercises General Exercises - Upper Extremity Shoulder Flexion: AROM, 10 reps, Both, Seated (x10 protraction same position) Shoulder ABduction: AROM, 10 reps, Both, Seated (x10 internal and external rotation same position.) Shoulder Horizontal ABduction: AROM, Both, 10 reps, Seated Other Exercises Other Exercises: x10 x to v arms seated in chair with B UE. Other Exercises: x3 reps of sit to stand in today with min G to min A using RW.                 Pertinent Vitals/ Pain       Pain Assessment Pain Assessment: No/denies pain                                                          Frequency  Min 2X/week        Progress Toward Goals  OT Goals(current goals can now be found in the care plan section)  Progress towards OT goals: Progressing toward goals  Acute Rehab OT Goals Patient Stated Goal: open to rehab OT Goal Formulation: With patient Time For Goal Achievement: 10/27/21 Potential to Achieve Goals: Fair ADL Goals Pt Will Perform Grooming: with min guard assist;sitting Pt Will Perform Upper Body Bathing: with min guard assist;sitting Pt Will Perform Upper Body Dressing: with min guard assist;sitting Pt Will Perform Lower Body Dressing: with mod assist;sitting/lateral leans Pt Will Transfer to Toilet: with min guard assist;squat pivot transfer Pt/caregiver will Perform Home Exercise Program: Increased strength;Both right and left upper extremity;Independently  Plan Discharge plan remains appropriate                                    End of Session Equipment Utilized During Treatment: Rolling walker (2 wheels);Gait belt  OT Visit Diagnosis: Unsteadiness on feet (R26.81);Other abnormalities of gait and mobility (R26.89);Muscle weakness (generalized)  (M62.81)   Activity Tolerance Patient tolerated treatment well   Patient Left with call bell/phone within reach;in chair   Nurse Communication Other (comment) (NT assisted with peri-care of pt. Notified that pt was asking for more chicken broth.)        Time: 3662-9476 OT Time Calculation (min): 26 min  Charges: OT General Charges $OT Visit: 1 Visit OT Treatments $Self Care/Home Management : 8-22 mins $Therapeutic Exercise: 8-22 mins  Doloris Servantes OT, MOT  Larey Seat 10/15/2021, 10:33 AM

## 2021-10-16 DIAGNOSIS — R778 Other specified abnormalities of plasma proteins: Secondary | ICD-10-CM | POA: Diagnosis not present

## 2021-10-16 DIAGNOSIS — D649 Anemia, unspecified: Secondary | ICD-10-CM | POA: Diagnosis not present

## 2021-10-16 DIAGNOSIS — I10 Essential (primary) hypertension: Secondary | ICD-10-CM | POA: Diagnosis not present

## 2021-10-16 DIAGNOSIS — K529 Noninfective gastroenteritis and colitis, unspecified: Secondary | ICD-10-CM | POA: Diagnosis not present

## 2021-10-16 DIAGNOSIS — D696 Thrombocytopenia, unspecified: Secondary | ICD-10-CM | POA: Diagnosis not present

## 2021-10-16 DIAGNOSIS — N186 End stage renal disease: Secondary | ICD-10-CM | POA: Diagnosis not present

## 2021-10-16 LAB — GLUCOSE, CAPILLARY
Glucose-Capillary: 167 mg/dL — ABNORMAL HIGH (ref 70–99)
Glucose-Capillary: 199 mg/dL — ABNORMAL HIGH (ref 70–99)
Glucose-Capillary: 155 mg/dL — ABNORMAL HIGH (ref 70–99)

## 2021-10-16 LAB — MITOCHONDRIAL ANTIBODIES: Mitochondrial M2 Ab, IgG: <20 Units (ref 0.0–20.0)

## 2021-10-16 LAB — LIPID PANEL
Cholesterol: 119 mg/dL (ref 0–200)
HDL: 59 mg/dL (ref >40)
LDL Cholesterol: 53 mg/dL (ref 0–99)
Total CHOL/HDL Ratio: 2 RATIO
Triglycerides: 37 mg/dL (ref <150)
VLDL: 7 mg/dL (ref 0–40)

## 2021-10-16 LAB — HEMOGLOBIN A1C
Hgb A1c MFr Bld: 5.5 % (ref 4.8–5.6)
Mean Plasma Glucose: 111.15 mg/dL

## 2021-10-16 LAB — IGM: IgM (Immunoglobulin M), Srm: 51 mg/dL (ref 26–217)

## 2021-10-16 MED ORDER — LOPERAMIDE HCL 2 MG PO CAPS
2.0000 mg | ORAL_CAPSULE | Freq: Four times a day (QID) | ORAL | Status: DC
  Administered 2021-10-16 – 2021-10-19 (×11): 2 mg via ORAL
  Filled 2021-10-16 (×11): qty 1

## 2021-10-16 NOTE — Progress Notes (Signed)
Erica [name] IWP:[id number] DOB: 06/14/1955 DOA: 10/12/2021 PCP: System, Provider Not In   Subj: 66 y.o. BF PMHx DM type II, HTN, HLD, ESRD on HD (TTS) seizure disorder, CAD    Presents to the emergency department via EMS due to nausea and vomiting which started  the day before the admission.  Patient has chronic confusion, patient missed her dialysis  on Saturday, due to not feeling well. On arrival to ED, she was found be anemic.  CT abdomen and pelvis without contrast showed rectosigmoid wall thickening and inflammation compatible with proctocolitis with questionable ascending colitis and questionable gastritis.  Small volume ascites and body wall edema and nonobstructing bilateral renal calculi also noted. CT head without contrast showed no acute intracranial abnormality Patient was treated with IV fentanyl and, Zofran was given, Lokelma was given due to hyperkalemia and she was empirically started on Zosyn due to presumed colitis. Hospitalist was asked to admit patient for further evaluation and management. GI consulted for colitis and hemoccult positive stools and acute blood loss anemia and recommendations to monitor for now.    Obj: 8/31 A/O x4, sitting in chair comfortably.  Negative postprandial abdominal pain.  Negative nausea.  Negative vomiting.  Positive diarrhea    Objective: VITAL SIGNS: Temp: 98.6 F (37 C) (08/31 0547) Temp Source: Oral (08/31 0547) BP: 160/64 (08/31 1400) Pulse Rate: 78 (08/31 1043) SPO2; FIO2:   Intake/Output Summary (Last 24 hours) at 10/16/2021 1449 Last data filed at 10/16/2021 0900 Gross per 24 hour  Intake 720 ml  Output --  Net 720 ml      Exam: General: A/O x4, No acute respiratory distress, cachectic Lungs: Clear to auscultation bilaterally without wheezes or crackles Cardiovascular: Regular rate and rhythm without murmur gallop or rub normal S1 and S2 Abdomen: Nontender, nondistended, soft, bowel sounds positive, no  rebound, no ascites, no appreciable mass Extremities: No significant cyanosis, clubbing, or edema bilateral lower extremities Skin: Negative rashes, lesions, ulcers Psychiatric:  Negative depression, negative anxiety, negative fatigue, negative mania  Central nervous system:  Cranial nerves II through XII intact, tongue/uvula midline, all extremities muscle strength 5/5, sensation intact throughout, negative dysarthria, negative expressive aphasia, negative receptive aphasia.    Mobility Assessment (last 72 hours)     Mobility Assessment     Row Name 10/15/21 1014 10/14/21 1000         Does patient have an order for bedrest or is patient medically unstable -- No - Continue assessment      What is the highest level of mobility based on the progressive mobility assessment? Level 3 (Stands with assist) - Balance while standing  and cannot march in place Level 3 (Stands with assist) - Balance while standing  and cannot march in place      Is the above level different from baseline mobility prior to current illness? -- Yes - Recommend PT order                 DVT prophylaxis: Subcu heparin Code Status: Full Family Communication:  Status is: Inpatient    Dispo: The patient is from: Home              Anticipated d/c is to: Home              Anticipated d/c date is: 2 days              Patient currently is not medically stable to d/c.   Procedures/Significant events 8/27 positive fecal occult  blood 8/29 GI panel negative 8/29 C. difficile antigen negative 8/29 C. difficile toxin negative    Consultants:     Cultures   Antimicrobials: Anti-infectives (From admission, onward)    Start     Ordered Stop   10/13/21 0900  piperacillin-tazobactam (ZOSYN) 2.25 g in sodium chloride 0.9 % 50 mL IVPB        10/13/21 0807     10/13/21 0600  piperacillin-tazobactam (ZOSYN) IVPB 2.25 g  Status:  Discontinued        10/12/21 2249 10/13/21 0806   10/12/21 2300   piperacillin-tazobactam (ZOSYN) IVPB 3.375 g        10/12/21 2250 10/12/21 2340        A/P  Nausea and vomiting, intermittent diarrhea probably from colitis and possible gastritis.  -Symptomatic  management with IV PPI, IV pain control, IV zofran.  -Complete course ABX 10 to 14 days .  -Blood cultures done and pending.    She reports nausea, no vomiting, mild generalized abdominal pain, 2 loose bowel movements.  C diff  pcr is negative. GI pathogen pcr is pending.  -8/31 per GI recommend  Complete course of antibiotics. F/U IgM and AMA results.  Outpatient colonoscopy and esophagogastroduodenoscopy in 6-8 weeks. -8/31 discussed case with nephrology patient not able to consume current diet very well changed to Dysphagia 3, 1500 ml fluid restriction. - 8/31 all cultures negative, patient continues to have diarrhea - 8/31 Imodium 2 mg QID   Acute anemia of blood loss Suspect GI blood loss as she was found to have hemoccult positive stools.  GI consulted and recommendations given.  -Transfuse for hemoglobin <7 Lab Results  Component Value Date   HGB 8.3 (L) 10/15/2021   HGB 8.4 (L) 10/14/2021   HGB 8.4 (L) 10/13/2021   HGB 9.3 (L) 10/12/2021   HGB 11.2 (L) 09/23/2021  -8/31 on 8/27 fecal occult positive for blood, hemoglobin stable since then see above.  Continue to monitor.     ESRD HD T/TH/Sat -Missed sat HD due to not feeling well.  -Nephrology on board.    Mild Elevated troponins from type II demand ischemia -EKG doe snot show any ischemic changes.  -Pt denies any chest pain.    Seizure disorder  -Keppra 500 mg daily     GERD Resume PPI.      Macrocytic anemia:  -Anemia panel showing iron levels at 39, ferritin at 1582, folate is >40 and vit b 12 is 1598.    Mild Thrombocytopenia:  -Continue to monitor.      DM type II controlled with Hyperglycemia on long-term insulin -8/31 Hemoglobin A1c= 5.5 - 8/31 LDL=53 within goal -Very sensitive SSI CBG (last 3)   Recent Labs    10/15/21 1627 10/15/21 2312 10/16/21 0748  GLUCAP 176* 131* 167*     Hyperkalemia:  -Secondary to missing ESRD - Resolved.   Hypocalcemia -Calcium goal> 8.9 -8/31 corrected calcium =9.2 no action required at this time.     Hypophosphatemia -Phosphorus goal> 2.5        Care during the described time interval was provided by me .  I have reviewed this patient's available data, including medical history, events of note, physical examination, and all test results as part of my evaluation.

## 2021-10-16 NOTE — Procedures (Signed)
   HEMODIALYSIS TREATMENT NOTE:   Uneventful 3.5 hour heparin-free treatment completed. Goal challenge for HTN was successful with 3L removed.  All blood was returned.  No changes from pre-HD assessment.  Pt is edentulous and struggling with main course selections; mechanical soft / chopped meats / ?renal friendly options d/w Dr. Sherral Hammers.  Hand-off given to Nigel Sloop, LPN.   Rockwell Alexandria, RN

## 2021-10-16 NOTE — Progress Notes (Signed)
Pt off unit

## 2021-10-16 NOTE — Progress Notes (Signed)
Patient ID: Erica [name], female   DOB: 1955/12/28, 66 y.o.   MRN: [id number]   S: Feels a little better today still -   has real food for breakfast but she is hesitant to try   O:BP (!) 154/61 (BP Location: Right Arm)   Pulse 79   Temp 98.6 F (37 C) (Oral)   Resp 18   Ht 4\' 11"  (1.499 m)   Wt 40 kg   SpO2 100%   BMI 17.81 kg/m   Intake/Output Summary (Last 24 hours) at 10/16/2021 0843 Last data filed at 10/15/2021 1700 Gross per 24 hour  Intake 1800 ml  Output --  Net 1800 ml   Intake/Output: I/O last 3 completed shifts: In: 2040 [P.O.:2040] Out: -   Intake/Output this shift:  No intake/output data recorded. Weight change:  Gen:NAD CVS: RRR Resp: CTA Abd: +BS, soft, +tenderness to palpation, no guarding/rebound-  better Ext: no edema, LUE AVG +T/B, aneurysmal changes noted.  Recent Labs  Lab 10/12/21 2001 10/13/21 0517 10/14/21 0744 10/15/21 0748  NA 133* 135 135 140  K 5.3* 5.2* 4.8 3.7  CL 98 101 102 106  CO2 26 27 23 26   GLUCOSE 170* 136* 73 246*  BUN 35* 38* 42* 18  CREATININE 5.73* 6.18* 6.54* 3.53*  ALBUMIN 3.2* 2.6* 2.5* 2.5*  CALCIUM 10.3 9.7 9.2 8.0*  PHOS  --  1.5*  --  2.1*  AST 31 26 24 23   ALT 35 29 28 27    Liver Function Tests: Recent Labs  Lab 10/13/21 0517 10/14/21 0744 10/15/21 0748  AST 26 24 23   ALT 29 28 27   ALKPHOS 215* 205* 192*  BILITOT 0.9 0.8 0.7  PROT 5.3* 5.3* 5.2*  ALBUMIN 2.6* 2.5* 2.5*   Recent Labs  Lab 10/12/21 2001  LIPASE 19   No results for input(s): "AMMONIA" in the last 168 hours. CBC: Recent Labs  Lab 10/12/21 2001 10/13/21 0517 10/14/21 0744 10/15/21 0748  WBC 5.2 4.2 3.6* 3.6*  NEUTROABS 3.2  --   --  1.8  HGB 9.3* 8.4* 8.4* 8.3*  HCT 29.6* 27.2* 27.7* 27.6*  MCV 101.7* 101.9* 106.1* 104.9*  PLT 114* 113* 104* 110*   Cardiac Enzymes: No results for input(s): "CKTOTAL", "CKMB", "CKMBINDEX", "TROPONINI" in the last 168 hours. CBG: Recent Labs  Lab 10/15/21 1132 10/15/21 1249  10/15/21 1627 10/15/21 2312 10/16/21 0748  GLUCAP 234* 187* 176* 131* 167*    Iron Studies:  Recent Labs    10/13/21 1359  IRON 39  TIBC 131*  FERRITIN 1,582*   Studies/Results: No results found.  (feeding supplement) PROSource Plus  30 mL Oral BID BM   calcitRIOL  0.25 mcg Oral Q T,Th,Sa-HD   carvedilol  12.5 mg Oral BID   Chlorhexidine Gluconate Cloth  6 each Topical Q0600   feeding supplement  1 Container Oral TID BM   heparin  5,000 Units Subcutaneous Q8H   hydrALAZINE  50 mg Oral Q8H   insulin aspart  0-5 Units Subcutaneous QHS   insulin aspart  0-6 Units Subcutaneous TID WC   levETIRAcetam  500 mg Oral QHS   multivitamin  1 tablet Oral QHS   nicotine  14 mg Transdermal Daily   pantoprazole (PROTONIX) IV  40 mg Intravenous Q12H    BMET    Component Value Date/Time   NA 140 10/15/2021 0748   K 3.7 10/15/2021 0748   CL 106 10/15/2021 0748   CO2 26 10/15/2021 0748   GLUCOSE 246 (H)  10/15/2021 0748   BUN 18 10/15/2021 0748   CREATININE 3.53 (H) 10/15/2021 0748   CALCIUM 8.0 (L) 10/15/2021 0748   GFRNONAA 14 (L) 10/15/2021 0748   GFRAA 15 (L) 11/18/2019 0742   CBC    Component Value Date/Time   WBC 3.6 (L) 10/15/2021 0748   RBC 2.63 (L) 10/15/2021 0748   HGB 8.3 (L) 10/15/2021 0748   HCT 27.6 (L) 10/15/2021 0748   PLT 110 (L) 10/15/2021 0748   MCV 104.9 (H) 10/15/2021 0748   MCH 31.6 10/15/2021 0748   MCHC 30.1 10/15/2021 0748   RDW 15.2 10/15/2021 0748   LYMPHSABS 1.1 10/15/2021 0748   MONOABS 0.6 10/15/2021 0748   EOSABS 0.1 10/15/2021 0748   BASOSABS 0.0 10/15/2021 0748    Dialysis Orders: Center: [facility] on TTS . EDW 41 Kg HD Bath 2K/3Ca  Time 3:45 Heparin 1000 units. Access LUE AVG BFR 400 DFR 500    Calcitriol 0.25 mcg po/HD Micera 50 mcg IV every 2 weeks   Assessment/Plan:  Colitis - started on IV zosyn, PPI, and zofran-  advancing diet  ESRD -  Continue HD on TTS schedule, no heparin.  Hypertension/volume  - stable  Anemia  - likely  due to ABLA and guaiac + stool.  Gi consulted and plan for EGD and colonoscopy in 4-6 weeks.  Hbg dropped slightly to 8.3.  transfuse for Hgb <7-  give ESA  Metabolic bone disease -  continue with home meds when able to take po-  calcitriol-  phos is low-  no binder needed   Nutrition - advance diet per primary  [place]

## 2021-10-16 NOTE — Progress Notes (Signed)
Gastroenterology Progress Note   Referring Provider: No ref. provider found Primary Care Physician:  System, Provider Not In Primary Gastroenterologist:  Elon Alas. Abbey Chatters, DO   Patient ID: Erica [name]; [id number]; 1955-12-02   Subjective:    Does not like breakfast so asked for broth. No N/V. Denies abdominal pain this morning. Stools slowing down and per nursing, little more consistency. No melena, brbpr.  Objective:   Vital signs in last 24 hours: Temp:  [98 F (36.7 C)-98.6 F (37 C)] 98.6 F (37 C) (08/31 0547) Pulse Rate:  [77-83] 79 (08/31 0547) Resp:  [18-20] 18 (08/31 0547) BP: (133-154)/(50-70) 154/61 (08/31 0547) SpO2:  [100 %] 100 % (08/31 0547) Last BM Date : 10/15/21 General:   Alert,  Well-developed, well-nourished, pleasant and cooperative in NAD Head:  Normocephalic and atraumatic. Eyes:  Sclera clear, no icterus.  Abdomen:  Soft, nontender and nondistended.  Normal bowel sounds, without guarding, and without rebound.   Extremities:  Without clubbing, deformity or edema. Neurologic:  Alert and  oriented x4;  grossly normal neurologically. Skin:  Intact without significant lesions or rashes. Psych:  Alert and cooperative. Normal mood and affect.  Intake/Output from previous day: 08/30 0701 - 08/31 0700 In: 2040 [P.O.:2040] Out: -  Intake/Output this shift: No intake/output data recorded.  Lab Results: CBC Recent Labs    10/14/21 0744 10/15/21 0748  WBC 3.6* 3.6*  HGB 8.4* 8.3*  HCT 27.7* 27.6*  MCV 106.1* 104.9*  PLT 104* 110*   BMET Recent Labs    10/14/21 0744 10/15/21 0748  NA 135 140  K 4.8 3.7  CL 102 106  CO2 23 26  GLUCOSE 73 246*  BUN 42* 18  CREATININE 6.54* 3.53*  CALCIUM 9.2 8.0*   LFTs Recent Labs    10/14/21 0744 10/15/21 0748  BILITOT 0.8 0.7  ALKPHOS 205* 192*  AST 24 23  ALT 28 27  PROT 5.3* 5.2*  ALBUMIN 2.5* 2.5*   No results for input(s): "LIPASE" in the last 72 hours. PT/INR No results for  input(s): "LABPROT", "INR" in the last 72 hours.     Lab Results  Component Value Date   IRON 39 10/13/2021   TIBC 131 (L) 10/13/2021   FERRITIN 1,582 (H) 10/13/2021   Lab Results  Component Value Date   VITAMINB12 1,598 (H) 10/13/2021   Lab Results  Component Value Date   FOLATE >40.0 10/13/2021     Imaging Studies: DG Chest Portable 1 View  Result Date: 10/12/2021 CLINICAL DATA:  Nausea and vomiting. EXAM: PORTABLE CHEST 1 VIEW COMPARISON:  August 24, 2021 FINDINGS: The cardiac silhouette is borderline in size. There is no evidence of acute infiltrate, pleural effusion or pneumothorax. The visualized skeletal structures are unremarkable. IMPRESSION: No active cardiopulmonary disease. Electronically Signed   By: Virgina Norfolk M.D.   On: 10/12/2021 19:04   CT ABDOMEN PELVIS WO CONTRAST  Result Date: 10/12/2021 CLINICAL DATA:  Nausea and vomiting. EXAM: CT ABDOMEN AND PELVIS WITHOUT CONTRAST TECHNIQUE: Multidetector CT imaging of the abdomen and pelvis was performed following the standard protocol without IV contrast. RADIATION DOSE REDUCTION: This exam was performed according to the departmental dose-optimization program which includes automated exposure control, adjustment of the mA and/or kV according to patient size and/or use of iterative reconstruction technique. COMPARISON:  CT abdomen and pelvis 08/15/2020. FINDINGS: Lower chest: No acute abnormality.  Heart is enlarged. Hepatobiliary: No focal liver abnormality is seen. Status post cholecystectomy. No biliary dilatation. Pancreas: Pancreas is  diffusely atrophic with coarse calcifications compatible with chronic pancreatitis, unchanged. No acute inflammatory changes. Spleen: Normal in size without focal abnormality. Adrenals/Urinary Tract: There is some punctate nonobstructing bilateral renal calculi. Prominent renal vascular calcifications are again seen. There is no hydronephrosis. No ureteral calculi are seen. The adrenal glands  are within normal limits. The bladder is under distended and not well evaluated. Stomach/Bowel: There is rectosigmoid wall thickening with mild surrounding inflammation. No dilated bowel loops, pneumatosis or free air. There is questionable wall thickening of the ascending colon. Small bowel is within normal limits. The stomach is decompressed. Gastric wall thickening not excluded. Vascular/Lymphatic: No significant vascular findings are present. No enlarged abdominal or pelvic lymph nodes. Extensive peripheral vascular calcifications are present. Reproductive: Uterus and bilateral adnexa are unremarkable. Other: Small volume ascites.  Body wall edema.  No focal hernia. Musculoskeletal: The bones are osteopenic. Chronic compression deformities of T10, T12 and L2 appear similar to the prior study. Right hip arthroplasty is similar to the prior study. IMPRESSION: 1. Rectosigmoid wall thickening and inflammation compatible with proctocolitis. 2. Questionable ascending colitis. 3. Questionable gastritis. 4. Small volume ascites and body wall edema. 5. Nonobstructing bilateral renal calculi. 6.  Aortic Atherosclerosis (ICD10-I70.0). Electronically Signed   By: Ronney Asters M.D.   On: 10/12/2021 19:03   CT Head Wo Contrast  Result Date: 10/12/2021 CLINICAL DATA:  Mental status change EXAM: CT HEAD WITHOUT CONTRAST TECHNIQUE: Contiguous axial images were obtained from the base of the skull through the vertex without intravenous contrast. RADIATION DOSE REDUCTION: This exam was performed according to the departmental dose-optimization program which includes automated exposure control, adjustment of the mA and/or kV according to patient size and/or use of iterative reconstruction technique. COMPARISON:  Head CT dated August 21, 2021 FINDINGS: Brain: No evidence of acute infarction, hemorrhage, hydrocephalus, extra-axial collection or mass lesion/mass effect. Vascular: No hyperdense vessel or unexpected calcification.  Skull: Normal. Negative for fracture or focal lesion. Sinuses/Orbits: No acute finding. Other: None. IMPRESSION: No acute intracranial abnormality. Electronically Signed   By: Yetta Glassman M.D.   On: 10/12/2021 18:57  [2 weeks]  Assessment:   66 year old female with history of diabetes, hypertension, hyperlipidemia, end-stage renal disease on dialysis, chronic anemia, history of chronic pancreatitis, CAD presenting to the ED this admission with acute nausea/vomiting, abdominal pain, diarrhea.  CT without contrast showed proctocolitis, questionable ascending colitis, possible gastritis.  Hemoglobin on admission was 9.3, previously 11.2 several weeks ago.  Stool heme positive.  GI consulted due to Hemoccult positive stool and anemia.  Anemia: Baseline around 11.  Hemoglobin 9.3 on admission, stool heme positive.  No overt GI bleeding.  No evidence of iron/B12/folate deficiency.  Plans for outpatient colonoscopy/EGD in 6 to 8 weeks.   Colitis: Likely acute gastroenteritis.  Currently on empiric antibiotics.  Resolving. N/V has subsided and she is tolerating diet. Per nursing stools less frequency and with improved consistency. Four [place] 6 stools C. difficile negative.  GI pathogen panel negative.  Only two stools since 7am yesterday. Plans for outpatient colonoscopy in 6 to 8 weeks.  Dysphagia: Solid food dysphagia, plan for EGD with esophageal dilation as an outpatient.  Weight loss: Unintentional, 7 pounds.  No food aversions. Notes poor appetite and loss of taste.  Elevated alkaline phosphatase: Chronically elevated, currently 192.  GGT 91 high.  She is immune to hepatitis B.  Hepatitis C antibody negative.  No evidence of iron overload.  IgM and mitochondrial antibodies pending.  Plan:   Complete course of  antibiotics. F/U IgM and AMA results.  Plans for outpatient colonoscopy and esophagogastroduodenoscopy in 6-8 weeks.   LOS: 4 days   Laureen Ochs. Bernarda Caffey [facility]  [facility] [phone] 8/31/20238:14 AM

## 2021-10-17 ENCOUNTER — Telehealth: Payer: Self-pay | Admitting: Gastroenterology

## 2021-10-17 DIAGNOSIS — K529 Noninfective gastroenteritis and colitis, unspecified: Secondary | ICD-10-CM | POA: Diagnosis not present

## 2021-10-17 DIAGNOSIS — N186 End stage renal disease: Secondary | ICD-10-CM | POA: Diagnosis not present

## 2021-10-17 DIAGNOSIS — R778 Other specified abnormalities of plasma proteins: Secondary | ICD-10-CM | POA: Diagnosis not present

## 2021-10-17 DIAGNOSIS — I10 Essential (primary) hypertension: Secondary | ICD-10-CM | POA: Diagnosis present

## 2021-10-17 LAB — COMPREHENSIVE METABOLIC PANEL
ALT: 24 U/L (ref 0–44)
AST: 22 U/L (ref 15–41)
Albumin: 2.3 g/dL — ABNORMAL LOW (ref 3.5–5.0)
Alkaline Phosphatase: 177 U/L — ABNORMAL HIGH (ref 38–126)
Anion gap: 7 (ref 5–15)
BUN: 14 mg/dL (ref 8–23)
CO2: 27 mmol/L (ref 22–32)
Calcium: 8 mg/dL — ABNORMAL LOW (ref 8.9–10.3)
Chloride: 108 mmol/L (ref 98–111)
Creatinine, Ser: 2.6 mg/dL — ABNORMAL HIGH (ref 0.44–1.00)
GFR, Estimated: 20 mL/min — ABNORMAL LOW (ref >60)
Glucose, Bld: 153 mg/dL — ABNORMAL HIGH (ref 70–99)
Potassium: 2.9 mmol/L — ABNORMAL LOW (ref 3.5–5.1)
Sodium: 142 mmol/L (ref 135–145)
Total Bilirubin: 0.7 mg/dL (ref 0.3–1.2)
Total Protein: 4.9 g/dL — ABNORMAL LOW (ref 6.5–8.1)

## 2021-10-17 LAB — GLUCOSE, CAPILLARY
Glucose-Capillary: 139 mg/dL — ABNORMAL HIGH (ref 70–99)
Glucose-Capillary: 142 mg/dL — ABNORMAL HIGH (ref 70–99)
Glucose-Capillary: 177 mg/dL — ABNORMAL HIGH (ref 70–99)
Glucose-Capillary: 129 mg/dL — ABNORMAL HIGH (ref 70–99)

## 2021-10-17 LAB — MAGNESIUM: Magnesium: 1.8 mg/dL (ref 1.7–2.4)

## 2021-10-17 LAB — PHOSPHORUS: Phosphorus: 2.5 mg/dL (ref 2.5–4.6)

## 2021-10-17 LAB — CBC WITH DIFFERENTIAL/PLATELET
Abs Immature Granulocytes: 0.01 10*3/uL (ref 0.00–0.07)
Basophils Absolute: 0 10*3/uL (ref 0.0–0.1)
Basophils Relative: 1 %
Eosinophils Absolute: 0.1 10*3/uL (ref 0.0–0.5)
Eosinophils Relative: 3 %
HCT: 25.1 % — ABNORMAL LOW (ref 36.0–46.0)
Hemoglobin: 7.7 g/dL — ABNORMAL LOW (ref 12.0–15.0)
Immature Granulocytes: 0 %
Lymphocytes Relative: 30 %
Lymphs Abs: 1.1 10*3/uL (ref 0.7–4.0)
MCH: 32 pg (ref 26.0–34.0)
MCHC: 30.7 g/dL (ref 30.0–36.0)
MCV: 104.1 fL — ABNORMAL HIGH (ref 80.0–100.0)
Monocytes Absolute: 0.5 10*3/uL (ref 0.1–1.0)
Monocytes Relative: 13 %
Neutro Abs: 1.9 10*3/uL (ref 1.7–7.7)
Neutrophils Relative %: 53 %
Platelets: 89 10*3/uL — ABNORMAL LOW (ref 150–400)
RBC: 2.41 MIL/uL — ABNORMAL LOW (ref 3.87–5.11)
RDW: 15.4 % (ref 11.5–15.5)
WBC: 3.6 10*3/uL — ABNORMAL LOW (ref 4.0–10.5)
nRBC: 0 % (ref 0.0–0.2)

## 2021-10-17 MED ORDER — POTASSIUM CHLORIDE 20 MEQ PO PACK
20.0000 meq | PACK | Freq: Two times a day (BID) | ORAL | Status: AC
Start: 2021-10-17 — End: 2021-10-17
  Administered 2021-10-17 (×2): 20 meq via ORAL
  Filled 2021-10-17 (×2): qty 1

## 2021-10-17 MED ORDER — CHLORHEXIDINE GLUCONATE CLOTH 2 % EX PADS
6.0000 | MEDICATED_PAD | Freq: Every day | CUTANEOUS | Status: DC
  Administered 2021-10-17 – 2021-10-19 (×3): 6 via TOPICAL

## 2021-10-17 MED ORDER — CARVEDILOL 12.5 MG PO TABS
25.0000 mg | ORAL_TABLET | Freq: Two times a day (BID) | ORAL | Status: DC
  Administered 2021-10-17 – 2021-10-19 (×4): 25 mg via ORAL
  Filled 2021-10-17 (×4): qty 2

## 2021-10-17 MED ORDER — DARBEPOETIN ALFA 200 MCG/0.4ML IJ SOSY
200.0000 ug | PREFILLED_SYRINGE | INTRAMUSCULAR | Status: DC
Start: 2021-10-18 — End: 2021-10-19
  Filled 2021-10-17: qty 0.4

## 2021-10-17 NOTE — Telephone Encounter (Signed)
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 Please arrange hospital follow for patient to discuss anemia, colitis, elevated alk phos, dysphagia and recent weight loss.  Schedule with any APP.

## 2021-10-17 NOTE — Telephone Encounter (Signed)
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 Please arrange hospital follow for patient to discuss anemia, colitis, elevated alk phos, dysphagia and recent weight loss in 3-4 weeks.    Schedule with any APP.

## 2021-10-17 NOTE — Progress Notes (Signed)
Erica [name] KYH:[id number] DOB: 14-Oct-1955 DOA: 10/12/2021 PCP: System, Provider Not In   Subj: 66 y.o. BF PMHx DM type II, HTN, HLD, ESRD on HD (TTS) seizure disorder, CAD    Presents to the emergency department via EMS due to nausea and vomiting which started  the day before the admission.  Patient has chronic confusion, patient missed her dialysis  on Saturday, due to not feeling well. On arrival to ED, she was found be anemic.  CT abdomen and pelvis without contrast showed rectosigmoid wall thickening and inflammation compatible with proctocolitis with questionable ascending colitis and questionable gastritis.  Small volume ascites and body wall edema and nonobstructing bilateral renal calculi also noted. CT head without contrast showed no acute intracranial abnormality Patient was treated with IV fentanyl and, Zofran was given, Lokelma was given due to hyperkalemia and she was empirically started on Zosyn due to presumed colitis. Hospitalist was asked to admit patient for further evaluation and management. GI consulted for colitis and hemoccult positive stools and acute blood loss anemia and recommendations to monitor for now.    Obj: 9/1 afebrile overnight A/O x4, joking with staff significantly improved.  Negative nausea, negative vomiting.  Negative diarrhea.    Objective: VITAL SIGNS: Temp: 97.6 F (36.4 C) (09/01 1352) Temp Source: Oral (09/01 1352) BP: 152/64 (09/01 1352) Pulse Rate: 77 (09/01 1352) SPO2; FIO2:   Intake/Output Summary (Last 24 hours) at 10/17/2021 1623 Last data filed at 10/17/2021 1200 Gross per 24 hour  Intake 1539.5 ml  Output --  Net 1539.5 ml     Exam: General: A/O x4, No acute respiratory distress, cachectic Lungs: Clear to auscultation bilaterally without wheezes or crackles Cardiovascular: Regular rate and rhythm without murmur gallop or rub normal S1 and S2 Abdomen: Nontender, nondistended, soft, bowel sounds positive, no rebound, no  ascites, no appreciable mass Extremities: No significant cyanosis, clubbing, or edema bilateral lower extremities Skin: Negative rashes, lesions, ulcers Psychiatric:  Negative depression, negative anxiety, negative fatigue, negative mania  Central nervous system:  Cranial nerves II through XII intact, tongue/uvula midline, all extremities muscle strength 5/5, sensation intact throughout, negative dysarthria, negative expressive aphasia, negative receptive aphasia.    Mobility Assessment (last 72 hours)     Mobility Assessment     Row Name 10/17/21 1000 10/16/21 2300 10/15/21 1014       Does patient have an order for bedrest or is patient medically unstable No - Continue assessment No - Continue assessment --     What is the highest level of mobility based on the progressive mobility assessment? Level 3 (Stands with assist) - Balance while standing  and cannot march in place Level 3 (Stands with assist) - Balance while standing  and cannot march in place Level 3 (Stands with assist) - Balance while standing  and cannot march in place     Is the above level different from baseline mobility prior to current illness? Yes - Recommend PT order Yes - Recommend PT order --                DVT prophylaxis: Subcu heparin Code Status: Full Family Communication:  Status is: Inpatient    Dispo: The patient is from: Home              Anticipated d/c is to: Home              Anticipated d/c date is: 2 days  Patient currently is not medically stable to d/c.   Procedures/Significant events 8/27 positive fecal occult blood 8/29 GI panel negative 8/29 C. difficile antigen negative 8/29 C. difficile toxin negative 8/29 SARS coronavirus negative    Consultants:     Cultures 8/27 positive fecal occult blood 8/29 MRSA by PCR negative 8/29 C. difficile antigen negative 8/29 C. difficile toxin negative   Antimicrobials: Anti-infectives (From admission, onward)     Start     Ordered Stop   10/13/21 0900  piperacillin-tazobactam (ZOSYN) 2.25 g in sodium chloride 0.9 % 50 mL IVPB        10/13/21 0807     10/13/21 0600  piperacillin-tazobactam (ZOSYN) IVPB 2.25 g  Status:  Discontinued        10/12/21 2249 10/13/21 0806   10/12/21 2300  piperacillin-tazobactam (ZOSYN) IVPB 3.375 g        10/12/21 2250 10/12/21 2340        A/P  Nausea and vomiting, intermittent diarrhea probably from colitis and possible gastritis.  -Symptomatic  management with IV PPI, IV pain control, IV zofran.  -Complete course ABX 10 to 14 days .  -Blood cultures: Not drawn -8/27 mitochondrial M2 antibody, IgG negative -Abdominal symptoms resolved -C diff  pcr is negative.   -8/31 per GI recommend  Complete course of antibiotics. F/U IgM and AMA results.  Outpatient colonoscopy and esophagogastroduodenoscopy in 6-8 weeks. -8/31 discussed case with nephrology patient not able to consume current diet very well changed to Dysphagia 3, 1500 ml fluid restriction. - 8/31 all cultures negative, patient continues to have diarrhea - 8/31 Imodium 2 mg QID -9/1 in A.m. change patient's antibiotics to p.o.   Acute anemia of blood loss Suspect GI blood loss as she was found to have hemoccult positive stools.  GI consulted and recommendations given.  -Transfuse for hemoglobin <7 Lab Results  Component Value Date   HGB 7.7 (L) 10/17/2021   HGB 8.3 (L) 10/15/2021   HGB 8.4 (L) 10/14/2021   HGB 8.4 (L) 10/13/2021   HGB 9.3 (L) 10/12/2021  -8/31 on 8/27 fecal occult positive for blood, hemoglobin stable since then see above.  Continue to monitor.     ESRD HD T/TH/Sat -Missed sat HD due to not feeling well.  -Nephrology on board.    Mild Elevated troponins from type II demand ischemia -EKG doe snot show any ischemic changes.  -Pt denies any chest pain.    Seizure disorder  -Keppra 500 mg daily     GERD Resume PPI.      Macrocytic anemia:  -Anemia panel showing iron  levels at 39, ferritin at 1582, folate is >40 and vit b 12 is 1598.    Mild Thrombocytopenia:  -Continue to monitor.      DM type II controlled with Hyperglycemia on long-term insulin -8/31 Hemoglobin A1c= 5.5 - 8/31 LDL=53 within goal -Very sensitive SSI CBG (last 3)  Recent Labs    10/16/21 2153 10/17/21 0740 10/17/21 1200  GLUCAP 155* 139* 142*    Essential HTN - 9/1 increase Coreg 25 mg BID - Hydralazine 50 mg TID  Hyperkalemia:  -Secondary to missing ESRD - Resolved.   Hypocalcemia -Calcium goal> 8.9 -8/31 corrected calcium =9.2 no action required at this time.     Hypophosphatemia -Phosphorus goal> 2.5        Care during the described time interval was provided by me .  I have reviewed this patient's available data, including medical history, events of note, physical examination,  and all test results as part of my evaluation.

## 2021-10-17 NOTE — Progress Notes (Signed)
Gastroenterology Progress Note   Referring Provider: No ref. provider found Primary Care Physician:  System, Provider Not In Primary Gastroenterologist:  Dr. Abbey Chatters  Patient ID: Erica [name]; [id number]; 12-14-1955    Subjective   Feeling well today. Denies N/V, abdominal pain. She reports only about 2 stools yesterday and has not had a stool this morning.    Objective   Vital signs in last 24 hours Temp:  [97.6 F (36.4 C)-98.4 F (36.9 C)] 97.6 F (36.4 C) (09/01 1352) Pulse Rate:  [77-86] 77 (09/01 1352) Resp:  [15-18] 17 (09/01 1352) BP: (133-158)/(59-72) 152/64 (09/01 1352) SpO2:  [99 %-100 %] 99 % (09/01 1352) Last BM Date : 10/16/21  Physical Exam General:   Alert and oriented, pleasant Head:  Normocephalic and atraumatic. Eyes:  No icterus, sclera clear. Conjuctiva pink.  Mouth:  Without lesions, mucosa pink and moist.  Neck:  Supple, without thyromegaly or masses.  Heart:  S1, S2 present, no murmurs noted.  Lungs: Clear to auscultation bilaterally, without wheezing, rales, or rhonchi.  Abdomen:  Bowel sounds present, soft, non-tender, non-distended. No HSM or hernias noted. No rebound or guarding. No masses appreciated  Msk:  Symmetrical without gross deformities. Normal posture. Pulses:  Normal pulses noted. Extremities:  Without clubbing or edema. Neurologic:  Alert and  oriented x4;  grossly normal neurologically. Skin:  Warm and dry, intact without significant lesions.  Cervical Nodes:  No significant cervical adenopathy. Psych:  Alert and cooperative. Normal mood and affect.  Intake/Output from previous day: 08/31 0701 - 09/01 0700 In: 120 [P.O.:120] Out: 3000  Intake/Output this shift: Total I/O In: 1059.5 [P.O.:620; IV Piggyback:439.5] Out: -   Lab Results  Recent Labs    10/15/21 0748 10/17/21 0444  WBC 3.6* 3.6*  HGB 8.3* 7.7*  HCT 27.6* 25.1*  PLT 110* 89*   BMET Recent Labs    10/15/21 0748 10/17/21 0444  NA 140 142  K  3.7 2.9*  CL 106 108  CO2 26 27  GLUCOSE 246* 153*  BUN 18 14  CREATININE 3.53* 2.60*  CALCIUM 8.0* 8.0*   LFT Recent Labs    10/15/21 0748 10/17/21 0444  PROT 5.2* 4.9*  ALBUMIN 2.5* 2.3*  AST 23 22  ALT 27 24  ALKPHOS 192* 177*  BILITOT 0.7 0.7   PT/INR No results for input(s): "LABPROT", "INR" in the last 72 hours. Hepatitis Panel No results for input(s): "HEPBSAG", "HCVAB", "HEPAIGM", "HEPBIGM" in the last 72 hours.  C-Diff PCR negative  Studies/Results DG Chest Portable 1 View  Result Date: 10/12/2021 CLINICAL DATA:  Nausea and vomiting. EXAM: PORTABLE CHEST 1 VIEW COMPARISON:  August 24, 2021 FINDINGS: The cardiac silhouette is borderline in size. There is no evidence of acute infiltrate, pleural effusion or pneumothorax. The visualized skeletal structures are unremarkable. IMPRESSION: No active cardiopulmonary disease. Electronically Signed   By: Virgina Norfolk M.D.   On: 10/12/2021 19:04   CT ABDOMEN PELVIS WO CONTRAST  Result Date: 10/12/2021 CLINICAL DATA:  Nausea and vomiting. EXAM: CT ABDOMEN AND PELVIS WITHOUT CONTRAST TECHNIQUE: Multidetector CT imaging of the abdomen and pelvis was performed following the standard protocol without IV contrast. RADIATION DOSE REDUCTION: This exam was performed according to the departmental dose-optimization program which includes automated exposure control, adjustment of the mA and/or kV according to patient size and/or use of iterative reconstruction technique. COMPARISON:  CT abdomen and pelvis 08/15/2020. FINDINGS: Lower chest: No acute abnormality.  Heart is enlarged. Hepatobiliary: No focal liver abnormality is  seen. Status post cholecystectomy. No biliary dilatation. Pancreas: Pancreas is diffusely atrophic with coarse calcifications compatible with chronic pancreatitis, unchanged. No acute inflammatory changes. Spleen: Normal in size without focal abnormality. Adrenals/Urinary Tract: There is some punctate nonobstructing  bilateral renal calculi. Prominent renal vascular calcifications are again seen. There is no hydronephrosis. No ureteral calculi are seen. The adrenal glands are within normal limits. The bladder is under distended and not well evaluated. Stomach/Bowel: There is rectosigmoid wall thickening with mild surrounding inflammation. No dilated bowel loops, pneumatosis or free air. There is questionable wall thickening of the ascending colon. Small bowel is within normal limits. The stomach is decompressed. Gastric wall thickening not excluded. Vascular/Lymphatic: No significant vascular findings are present. No enlarged abdominal or pelvic lymph nodes. Extensive peripheral vascular calcifications are present. Reproductive: Uterus and bilateral adnexa are unremarkable. Other: Small volume ascites.  Body wall edema.  No focal hernia. Musculoskeletal: The bones are osteopenic. Chronic compression deformities of T10, T12 and L2 appear similar to the prior study. Right hip arthroplasty is similar to the prior study. IMPRESSION: 1. Rectosigmoid wall thickening and inflammation compatible with proctocolitis. 2. Questionable ascending colitis. 3. Questionable gastritis. 4. Small volume ascites and body wall edema. 5. Nonobstructing bilateral renal calculi. 6.  Aortic Atherosclerosis (ICD10-I70.0). Electronically Signed   By: Ronney Asters M.D.   On: 10/12/2021 19:03   CT Head Wo Contrast  Result Date: 10/12/2021 CLINICAL DATA:  Mental status change EXAM: CT HEAD WITHOUT CONTRAST TECHNIQUE: Contiguous axial images were obtained from the base of the skull through the vertex without intravenous contrast. RADIATION DOSE REDUCTION: This exam was performed according to the departmental dose-optimization program which includes automated exposure control, adjustment of the mA and/or kV according to patient size and/or use of iterative reconstruction technique. COMPARISON:  Head CT dated August 21, 2021 FINDINGS: Brain: No evidence of  acute infarction, hemorrhage, hydrocephalus, extra-axial collection or mass lesion/mass effect. Vascular: No hyperdense vessel or unexpected calcification. Skull: Normal. Negative for fracture or focal lesion. Sinuses/Orbits: No acute finding. Other: None. IMPRESSION: No acute intracranial abnormality. Electronically Signed   By: Yetta Glassman M.D.   On: 10/12/2021 18:57    Assessment  66 y.o. female with a history of diabetes, HTN, HLD, ESRD on dialysis, chronic anemia, history of chronic pancreatitis, CAD who presented to the ED with acute nausea/vomiting, abdominal pain, diarrhea.  CT without contrast showed proctocolitis, questionable ascending colitis, possible gastritis.  Hemoglobin on admission 9.3, previously 11.2 several weeks ago.  Stools also heme positive.  GI consulted due to Hemoccult positive stool and anemia.  Anemia: Baseline around 11. Hgb 9.3 on admission with heme positive stool. No overt GI bleeding. Iron/B12/folate normal. ESRD may be contributing factor as well. Last colonoscopy in 2012 with fair prep and limited exam with 2 small tubular adenomas removed. Plan for EGD and colonoscopy outpatient in 6-8 weeks.  Colitis: Likely acute gastroenteritis. Continue with empiric antibiotics. N/V resolved. Tolerating diet. C. Diff and GI pathogen panel negative. Stools have been improving. Abdominal pain resolved. No bowel movement today, only 2 yesterday. As stated above plan for colonoscopy outpatient in 6-8 weeks.   Dysphagia: Reported solid food dysphagia, EGD with possible dilation on outpatient basis. Reports no issues with breakfast or lunch today on dysphagia 3 diet.   Weight loss: Reported unintentional 7 lb weight loss. Denied food aversion. Reports poor appetite and loss of taste previously. Has been eating 90% of meals and likes the broth.   Elevated alkaline phosphatase: Chronically elevated,  currently 177 (192). Was 253 on admission. GGT 91 (H). Immune to Hep B. Negative  for hepatitis C. No evidence of iron overload and IgM and mitochondrial antibodies negative. CT with no focal liver abnormality and no biliary ductal dilation as well as no acute inflammatory changes of the pancreas, coarse calcifications remain. Continue to monitor.   Plan / Recommendations  Complete antibiotics Continue dysphagia 3 diet Outpatient EGD/ED and colonoscopy in 6-8 weeks.  Hospital follow up in 3-4 weeks, patient requests to send a letter and call her son [name] to notify him of appointment.     LOS: 5 days    10/17/2021, 2:07 PM   Venetia Night, MSN, FNP-BC, AGACNP-BC [facility]

## 2021-10-17 NOTE — Care Management Important Message (Signed)
Important Message  Patient Details  Name: Erica [name] MRN: [id number] Date of Birth: 1956/02/06   Medicare Important Message Given:  Yes     Tommy Medal 10/17/2021, 12:40 PM

## 2021-10-17 NOTE — Progress Notes (Addendum)
Patient ID: Erica [name], female   DOB: [birth year], 66 y.o.   MRN: [id number]   S: Feels a little better today still she says but still hasnt eaten any real food it seems-   HD yesterday removed 3 liters-  tolerated well    O:BP (!) 146/64 (BP Location: Right Arm)   Pulse 77   Temp 98.1 F (36.7 C) (Oral)   Resp 18   Ht 4\' 11"  (1.499 m)   Wt 41.3 kg   SpO2 100%   BMI 18.39 kg/m   Intake/Output Summary (Last 24 hours) at 10/17/2021 0750 Last data filed at 10/16/2021 1455 Gross per 24 hour  Intake 120 ml  Output 3000 ml  Net -2880 ml   Intake/Output: I/O last 3 completed shifts: In: 120 [P.O.:120] Out: 3000 [Other:3000]  Intake/Output this shift:  No intake/output data recorded. Weight change:  Gen:NAD CVS: RRR Resp: CTA Abd: +BS, soft, +tenderness to palpation, no guarding/rebound-  better Ext: no edema, LUE AVG +T/B, aneurysmal changes noted.  Recent Labs  Lab 10/12/21 2001 10/13/21 0517 10/14/21 0744 10/15/21 0748 10/17/21 0444  NA 133* 135 135 140 142  K 5.3* 5.2* 4.8 3.7 2.9*  CL 98 101 102 106 108  CO2 26 27 23 26 27   GLUCOSE 170* 136* 73 246* 153*  BUN 35* 38* 42* 18 14  CREATININE 5.73* 6.18* 6.54* 3.53* 2.60*  ALBUMIN 3.2* 2.6* 2.5* 2.5* 2.3*  CALCIUM 10.3 9.7 9.2 8.0* 8.0*  PHOS  --  1.5*  --  2.1* 2.5  AST 31 26 24 23 22   ALT 35 29 28 27 24    Liver Function Tests: Recent Labs  Lab 10/14/21 0744 10/15/21 0748 10/17/21 0444  AST 24 23 22   ALT 28 27 24   ALKPHOS 205* 192* 177*  BILITOT 0.8 0.7 0.7  PROT 5.3* 5.2* 4.9*  ALBUMIN 2.5* 2.5* 2.3*   Recent Labs  Lab 10/12/21 2001  LIPASE 19   No results for input(s): "AMMONIA" in the last 168 hours. CBC: Recent Labs  Lab 10/12/21 2001 10/13/21 0517 10/14/21 0744 10/15/21 0748 10/17/21 0444  WBC 5.2 4.2 3.6* 3.6* 3.6*  NEUTROABS 3.2  --   --  1.8 1.9  HGB 9.3* 8.4* 8.4* 8.3* 7.7*  HCT 29.6* 27.2* 27.7* 27.6* 25.1*  MCV 101.7* 101.9* 106.1* 104.9* 104.1*  PLT 114* 113* 104* 110*  89*   Cardiac Enzymes: No results for input(s): "CKTOTAL", "CKMB", "CKMBINDEX", "TROPONINI" in the last 168 hours. CBG: Recent Labs  Lab 10/15/21 2312 10/16/21 0748 10/16/21 1654 10/16/21 2153 10/17/21 0740  GLUCAP 131* 167* 199* 155* 139*    Iron Studies:  No results for input(s): "IRON", "TIBC", "TRANSFERRIN", "FERRITIN" in the last 72 hours.  Studies/Results: No results found.  (feeding supplement) PROSource Plus  30 mL Oral BID BM   calcitRIOL  0.25 mcg Oral Q T,Th,Sa-HD   carvedilol  12.5 mg Oral BID   Chlorhexidine Gluconate Cloth  6 each Topical Q0600   feeding supplement  1 Container Oral TID BM   heparin  5,000 Units Subcutaneous Q8H   hydrALAZINE  50 mg Oral Q8H   insulin aspart  0-5 Units Subcutaneous QHS   insulin aspart  0-6 Units Subcutaneous TID WC   levETIRAcetam  500 mg Oral QHS   loperamide  2 mg Oral QID   multivitamin  1 tablet Oral QHS   nicotine  14 mg Transdermal Daily   pantoprazole (PROTONIX) IV  40 mg Intravenous Q12H    BMET  Component Value Date/Time   NA 142 10/17/2021 0444   K 2.9 (L) 10/17/2021 0444   CL 108 10/17/2021 0444   CO2 27 10/17/2021 0444   GLUCOSE 153 (H) 10/17/2021 0444   BUN 14 10/17/2021 0444   CREATININE 2.60 (H) 10/17/2021 0444   CALCIUM 8.0 (L) 10/17/2021 0444   GFRNONAA 20 (L) 10/17/2021 0444   GFRAA 15 (L) 11/18/2019 0742   CBC    Component Value Date/Time   WBC 3.6 (L) 10/17/2021 0444   RBC 2.41 (L) 10/17/2021 0444   HGB 7.7 (L) 10/17/2021 0444   HCT 25.1 (L) 10/17/2021 0444   PLT 89 (L) 10/17/2021 0444   MCV 104.1 (H) 10/17/2021 0444   MCH 32.0 10/17/2021 0444   MCHC 30.7 10/17/2021 0444   RDW 15.4 10/17/2021 0444   LYMPHSABS 1.1 10/17/2021 0444   MONOABS 0.5 10/17/2021 0444   EOSABS 0.1 10/17/2021 0444   BASOSABS 0.0 10/17/2021 0444    Dialysis Orders: Center: [facility] on TTS . EDW 41 Kg HD Bath 2K/3Ca  Time 3:45 Heparin 1000 units. Access LUE AVG BFR 400 DFR 500    Calcitriol 0.25 mcg  po/HD Micera 50 mcg IV every 2 weeks   Assessment/Plan:  Colitis - started on IV zosyn, PPI, and zofran-  advancing diet-  it seems to be going slow  ESRD -  Continue HD on TTS schedule, no heparin. Plan for next HD on 9/2 if she is still here-  if discharged can go to her OP unit   Hypertension/volume  - stable-  on coreg and hydralazine  Anemia  - likely due to ABLA and guaiac + stool.  Gi consulted and plan for EGD and colonoscopy in 4-6 weeks.  Hbg dropped slightly to 8.3.  transfuse for Hgb <7-  giving ESA  Metabolic bone disease -  continue with home meds when able to take po-  calcitriol-  phos is low-  no binder needed   Nutrition - advance diet per primary Hypokalemia-  checked at least 12 hours after HD-  will give a little K and run tomorrow on a higher k bath   Patient will not be physically seen over the weekend-  to revisit on Monday -  HD ordered for 9/2 as above.  Call with any questions  [facility]

## 2021-10-18 DIAGNOSIS — N186 End stage renal disease: Secondary | ICD-10-CM | POA: Diagnosis not present

## 2021-10-18 DIAGNOSIS — I10 Essential (primary) hypertension: Secondary | ICD-10-CM | POA: Diagnosis not present

## 2021-10-18 DIAGNOSIS — K529 Noninfective gastroenteritis and colitis, unspecified: Secondary | ICD-10-CM | POA: Diagnosis not present

## 2021-10-18 DIAGNOSIS — R778 Other specified abnormalities of plasma proteins: Secondary | ICD-10-CM | POA: Diagnosis not present

## 2021-10-18 LAB — CBC WITH DIFFERENTIAL/PLATELET
Abs Immature Granulocytes: 0.01 10*3/uL (ref 0.00–0.07)
Basophils Absolute: 0 10*3/uL (ref 0.0–0.1)
Basophils Relative: 1 %
Eosinophils Absolute: 0.1 10*3/uL (ref 0.0–0.5)
Eosinophils Relative: 4 %
HCT: 26.2 % — ABNORMAL LOW (ref 36.0–46.0)
Hemoglobin: 8 g/dL — ABNORMAL LOW (ref 12.0–15.0)
Immature Granulocytes: 0 %
Lymphocytes Relative: 36 %
Lymphs Abs: 1.3 10*3/uL (ref 0.7–4.0)
MCH: 31.9 pg (ref 26.0–34.0)
MCHC: 30.5 g/dL (ref 30.0–36.0)
MCV: 104.4 fL — ABNORMAL HIGH (ref 80.0–100.0)
Monocytes Absolute: 0.3 10*3/uL (ref 0.1–1.0)
Monocytes Relative: 9 %
Neutro Abs: 1.9 10*3/uL (ref 1.7–7.7)
Neutrophils Relative %: 50 %
Platelets: 84 10*3/uL — ABNORMAL LOW (ref 150–400)
RBC: 2.51 MIL/uL — ABNORMAL LOW (ref 3.87–5.11)
RDW: 15.3 % (ref 11.5–15.5)
WBC: 3.7 10*3/uL — ABNORMAL LOW (ref 4.0–10.5)
nRBC: 0 % (ref 0.0–0.2)

## 2021-10-18 LAB — COMPREHENSIVE METABOLIC PANEL
ALT: 24 U/L (ref 0–44)
AST: 20 U/L (ref 15–41)
Albumin: 2.2 g/dL — ABNORMAL LOW (ref 3.5–5.0)
Alkaline Phosphatase: 161 U/L — ABNORMAL HIGH (ref 38–126)
Anion gap: 5 (ref 5–15)
BUN: 20 mg/dL (ref 8–23)
CO2: 27 mmol/L (ref 22–32)
Calcium: 8.1 mg/dL — ABNORMAL LOW (ref 8.9–10.3)
Chloride: 110 mmol/L (ref 98–111)
Creatinine, Ser: 3.68 mg/dL — ABNORMAL HIGH (ref 0.44–1.00)
GFR, Estimated: 13 mL/min — ABNORMAL LOW (ref >60)
Glucose, Bld: 137 mg/dL — ABNORMAL HIGH (ref 70–99)
Potassium: 3.9 mmol/L (ref 3.5–5.1)
Sodium: 142 mmol/L (ref 135–145)
Total Bilirubin: 0.7 mg/dL (ref 0.3–1.2)
Total Protein: 4.8 g/dL — ABNORMAL LOW (ref 6.5–8.1)

## 2021-10-18 LAB — GLUCOSE, CAPILLARY
Glucose-Capillary: 151 mg/dL — ABNORMAL HIGH (ref 70–99)
Glucose-Capillary: 194 mg/dL — ABNORMAL HIGH (ref 70–99)
Glucose-Capillary: 197 mg/dL — ABNORMAL HIGH (ref 70–99)
Glucose-Capillary: 318 mg/dL — ABNORMAL HIGH (ref 70–99)

## 2021-10-18 LAB — MAGNESIUM: Magnesium: 1.8 mg/dL (ref 1.7–2.4)

## 2021-10-18 LAB — PHOSPHORUS: Phosphorus: 3.7 mg/dL (ref 2.5–4.6)

## 2021-10-18 MED ORDER — DARBEPOETIN ALFA 200 MCG/0.4ML IJ SOSY
PREFILLED_SYRINGE | INTRAMUSCULAR | Status: AC
  Administered 2021-10-18: 200 ug via INTRAVENOUS
  Filled 2021-10-18: qty 0.4

## 2021-10-18 NOTE — Progress Notes (Signed)
Erica [name] YJE:[id number] DOB: Sep 08, 1955 DOA: 10/12/2021 PCP: System, Provider Not In   Subj: 66 y.o. BF PMHx DM type II, HTN, HLD, ESRD on HD (TTS) seizure disorder, CAD    Presents to the emergency department via EMS due to nausea and vomiting which started  the day before the admission.  Patient has chronic confusion, patient missed her dialysis  on Saturday, due to not feeling well. On arrival to ED, she was found be anemic.  CT abdomen and pelvis without contrast showed rectosigmoid wall thickening and inflammation compatible with proctocolitis with questionable ascending colitis and questionable gastritis.  Small volume ascites and body wall edema and nonobstructing bilateral renal calculi also noted. CT head without contrast showed no acute intracranial abnormality Patient was treated with IV fentanyl and, Zofran was given, Lokelma was given due to hyperkalemia and she was empirically started on Zosyn due to presumed colitis. Hospitalist was asked to admit patient for further evaluation and management. GI consulted for colitis and hemoccult positive stools and acute blood loss anemia and recommendations to monitor for now.    Obj: 9/2 afebrile overnight, A/O x4.  Negative nausea.  Negative vomiting.  Negative diarrhea.  Negative abdominal pain.   Objective: VITAL SIGNS: Temp: 98.1 F (36.7 C) (09/02 1345) Temp Source: Oral (09/02 1345) BP: 160/83 (09/02 1700) Pulse Rate: 77 (09/02 1700) SPO2; FIO2:   Intake/Output Summary (Last 24 hours) at 10/18/2021 1717 Last data filed at 10/18/2021 1509 Gross per 24 hour  Intake 1622.08 ml  Output --  Net 1622.08 ml      Exam: General: A/O x4, No acute respiratory distress, cachectic Lungs: Clear to auscultation bilaterally without wheezes or crackles Cardiovascular: Regular rate and rhythm without murmur gallop or rub normal S1 and S2 Abdomen: Nontender, nondistended, soft, bowel sounds positive, no rebound, no ascites, no  appreciable mass Extremities: No significant cyanosis, clubbing, or edema bilateral lower extremities Skin: Negative rashes, lesions, ulcers Psychiatric:  Negative depression, negative anxiety, negative fatigue, negative mania  Central nervous system:  Cranial nerves II through XII intact, tongue/uvula midline, all extremities muscle strength 5/5, sensation intact throughout, negative dysarthria, negative expressive aphasia, negative receptive aphasia.    Mobility Assessment (last 72 hours)     Mobility Assessment     Row Name 10/17/21 1930 10/17/21 1000 10/16/21 2300       Does patient have an order for bedrest or is patient medically unstable No - Continue assessment No - Continue assessment No - Continue assessment     What is the highest level of mobility based on the progressive mobility assessment? Level 3 (Stands with assist) - Balance while standing  and cannot march in place Level 3 (Stands with assist) - Balance while standing  and cannot march in place Level 3 (Stands with assist) - Balance while standing  and cannot march in place     Is the above level different from baseline mobility prior to current illness? -- Yes - Recommend PT order Yes - Recommend PT order                DVT prophylaxis: Subcu heparin Code Status: Full Family Communication:  Status is: Inpatient    Dispo: The patient is from: Home              Anticipated d/c is to: Home              Anticipated d/c date is: 2 days  Patient currently is not medically stable to d/c.   Procedures/Significant events 8/27 positive fecal occult blood 8/29 GI panel negative 8/29 C. difficile antigen negative 8/29 C. difficile toxin negative 8/29 SARS coronavirus negative    Consultants:     Cultures 8/27 positive fecal occult blood 8/29 MRSA by PCR negative 8/29 C. difficile antigen negative 8/29 C. difficile toxin negative   Antimicrobials: Anti-infectives (From admission, onward)     Start     Ordered Stop   10/13/21 0900  piperacillin-tazobactam (ZOSYN) 2.25 g in sodium chloride 0.9 % 50 mL IVPB        10/13/21 0807     10/13/21 0600  piperacillin-tazobactam (ZOSYN) IVPB 2.25 g  Status:  Discontinued        10/12/21 2249 10/13/21 0806   10/12/21 2300  piperacillin-tazobactam (ZOSYN) IVPB 3.375 g        10/12/21 2250 10/12/21 2340        A/P  Nausea and vomiting, intermittent diarrhea probably from colitis and possible gastritis.  -Symptomatic  management with IV PPI, IV pain control, IV zofran.  -Complete course ABX 10 to 14 days .  -Blood cultures: Not drawn -8/27 mitochondrial M2 antibody, IgG negative -Abdominal symptoms resolved -C diff  pcr is negative.   -8/31 per GI recommend  Complete course of antibiotics. F/U IgM and AMA results.  Outpatient colonoscopy and esophagogastroduodenoscopy in 6-8 weeks. -8/31 discussed case with nephrology patient not able to consume current diet very well changed to Dysphagia 3, 1500 ml fluid restriction. - 8/31 all cultures negative, patient continues to have diarrhea - 8/31 Imodium 2 mg QID -9/2 since patient does not have SNF yet hold on changing ABX to p.o.   Acute anemia of blood loss Suspect GI blood loss as she was found to have hemoccult positive stools.  GI consulted and recommendations given.  -Transfuse for hemoglobin <7 Lab Results  Component Value Date   HGB 8.0 (L) 10/18/2021   HGB 7.7 (L) 10/17/2021   HGB 8.3 (L) 10/15/2021   HGB 8.4 (L) 10/14/2021   HGB 8.4 (L) 10/13/2021  -8/31 on 8/27 fecal occult positive for blood, hemoglobin stable since then see above.  Continue to monitor.     ESRD HD T/TH/Sat -Missed sat HD due to not feeling well.  -Nephrology on board.    Mild Elevated troponins from type II demand ischemia -EKG doe snot show any ischemic changes.  -Pt denies any chest pain.    Seizure disorder  -Keppra 500 mg daily     GERD Resume PPI.      Macrocytic anemia:   -Anemia panel showing iron levels at 39, ferritin at 1582, folate is >40 and vit b 12 is 1598.    Mild Thrombocytopenia:  -Continue to monitor.      DM type II controlled with Hyperglycemia on long-term insulin -8/31 Hemoglobin A1c= 5.5 - 8/31 LDL=53 within goal -Very sensitive SSI CBG (last 3)  Recent Labs    10/18/21 0723 10/18/21 1116 10/18/21 1609  GLUCAP 151* 194* 197*     Essential HTN - 9/1 increase Coreg 25 mg BID - Hydralazine 50 mg TID -9/2 patient scheduled for HD today, if BP still elevated in a.m. will adjust medication.  Hyperkalemia:  -Secondary to missing ESRD - Resolved.   Hypocalcemia -Calcium goal> 8.9 -8/31 corrected calcium =9.2 no action required at this time.     Hypophosphatemia -Phosphorus goal> 2.5        Care during the described time  interval was provided by me .  I have reviewed this patient's available data, including medical history, events of note, physical examination, and all test results as part of my evaluation.

## 2021-10-18 NOTE — Plan of Care (Signed)
  Problem: Clinical Measurements: Goal: Respiratory complications will improve Outcome: Progressing   Problem: Nutrition: Goal: Adequate nutrition will be maintained Outcome: Progressing   Problem: Coping: Goal: Level of anxiety will decrease Outcome: Progressing   

## 2021-10-18 NOTE — Procedures (Signed)
   HEMODIALYSIS TREATMENT NOTE:   Uneventful 3.5 hour heparin-free treatment completed using left upper arm AVG (15g/retrograde flow). Goal met; 2 liters removed without interruption in UF. All blood was returned. No changes from pre-HD assessment.  Hand-off given to Nigel Sloop, LPN.   Rockwell Alexandria, RN

## 2021-10-19 DIAGNOSIS — R778 Other specified abnormalities of plasma proteins: Secondary | ICD-10-CM | POA: Diagnosis not present

## 2021-10-19 DIAGNOSIS — I10 Essential (primary) hypertension: Secondary | ICD-10-CM | POA: Diagnosis not present

## 2021-10-19 DIAGNOSIS — K529 Noninfective gastroenteritis and colitis, unspecified: Secondary | ICD-10-CM | POA: Diagnosis not present

## 2021-10-19 DIAGNOSIS — N186 End stage renal disease: Secondary | ICD-10-CM | POA: Diagnosis not present

## 2021-10-19 LAB — CBC WITH DIFFERENTIAL/PLATELET
Abs Immature Granulocytes: 0.01 10*3/uL (ref 0.00–0.07)
Basophils Absolute: 0 10*3/uL (ref 0.0–0.1)
Basophils Relative: 1 %
Eosinophils Absolute: 0.1 10*3/uL (ref 0.0–0.5)
Eosinophils Relative: 3 %
HCT: 28 % — ABNORMAL LOW (ref 36.0–46.0)
Hemoglobin: 8.4 g/dL — ABNORMAL LOW (ref 12.0–15.0)
Immature Granulocytes: 0 %
Lymphocytes Relative: 34 %
Lymphs Abs: 1.4 10*3/uL (ref 0.7–4.0)
MCH: 31.7 pg (ref 26.0–34.0)
MCHC: 30 g/dL (ref 30.0–36.0)
MCV: 105.7 fL — ABNORMAL HIGH (ref 80.0–100.0)
Monocytes Absolute: 0.4 10*3/uL (ref 0.1–1.0)
Monocytes Relative: 11 %
Neutro Abs: 2.1 10*3/uL (ref 1.7–7.7)
Neutrophils Relative %: 51 %
Platelets: 80 10*3/uL — ABNORMAL LOW (ref 150–400)
RBC: 2.65 MIL/uL — ABNORMAL LOW (ref 3.87–5.11)
RDW: 14.9 % (ref 11.5–15.5)
WBC: 4 10*3/uL (ref 4.0–10.5)
nRBC: 0 % (ref 0.0–0.2)

## 2021-10-19 LAB — COMPREHENSIVE METABOLIC PANEL
ALT: 28 U/L (ref 0–44)
AST: 40 U/L (ref 15–41)
Albumin: 2.2 g/dL — ABNORMAL LOW (ref 3.5–5.0)
Alkaline Phosphatase: 159 U/L — ABNORMAL HIGH (ref 38–126)
Anion gap: 5 (ref 5–15)
BUN: 14 mg/dL (ref 8–23)
CO2: 28 mmol/L (ref 22–32)
Calcium: 7.8 mg/dL — ABNORMAL LOW (ref 8.9–10.3)
Chloride: 107 mmol/L (ref 98–111)
Creatinine, Ser: 2.35 mg/dL — ABNORMAL HIGH (ref 0.44–1.00)
GFR, Estimated: 22 mL/min — ABNORMAL LOW (ref >60)
Glucose, Bld: 204 mg/dL — ABNORMAL HIGH (ref 70–99)
Potassium: 3.6 mmol/L (ref 3.5–5.1)
Sodium: 140 mmol/L (ref 135–145)
Total Bilirubin: 0.7 mg/dL (ref 0.3–1.2)
Total Protein: 4.8 g/dL — ABNORMAL LOW (ref 6.5–8.1)

## 2021-10-19 LAB — PHOSPHORUS: Phosphorus: 2.6 mg/dL (ref 2.5–4.6)

## 2021-10-19 LAB — MAGNESIUM: Magnesium: 1.7 mg/dL (ref 1.7–2.4)

## 2021-10-19 LAB — GLUCOSE, CAPILLARY
Glucose-Capillary: 188 mg/dL — ABNORMAL HIGH (ref 70–99)
Glucose-Capillary: 194 mg/dL — ABNORMAL HIGH (ref 70–99)

## 2021-10-19 MED ORDER — CARVEDILOL 25 MG PO TABS: 25.0000 mg | ORAL_TABLET | Freq: Two times a day (BID) | ORAL | 0 refills | Status: DC

## 2021-10-19 MED ORDER — ACETAMINOPHEN 325 MG PO TABS: 650.0000 mg | ORAL_TABLET | Freq: Four times a day (QID) | ORAL | 0 refills | Status: AC | PRN

## 2021-10-19 MED ORDER — HYDRALAZINE HCL 50 MG PO TABS: 50.0000 mg | ORAL_TABLET | Freq: Three times a day (TID) | ORAL | 0 refills | Status: DC

## 2021-10-19 MED ORDER — DARBEPOETIN ALFA 200 MCG/0.4ML IJ SOSY: 200.0000 ug | PREFILLED_SYRINGE | INTRAMUSCULAR | 0 refills | Status: DC

## 2021-10-19 MED ORDER — NICOTINE 14 MG/24HR TD PT24: 14.0000 mg | MEDICATED_PATCH | Freq: Every day | TRANSDERMAL | 0 refills | Status: DC

## 2021-10-19 MED ORDER — SODIUM CHLORIDE 0.9 % IV SOLN: 2.2500 g | Freq: Three times a day (TID) | INTRAVENOUS | 0 refills | Status: DC

## 2021-10-19 MED ORDER — LEVETIRACETAM 500 MG PO TABS: 500.0000 mg | ORAL_TABLET | Freq: Every day | ORAL | 0 refills | Status: DC

## 2021-10-19 MED ORDER — PNEUMOCOCCAL 20-VAL CONJ VACC 0.5 ML IM SUSY: 0.5000 mL | PREFILLED_SYRINGE | Freq: Once | INTRAMUSCULAR | 0 refills | Status: AC

## 2021-10-19 NOTE — Progress Notes (Signed)
Nsg Discharge Note  Admit Date:  10/12/2021 Discharge date: 10/19/2021   Erica [name] to be D/C'd Nursing Home per MD order.  AVS completed.   Patient/caregiver able to verbalize understanding.  Discharge Medication: Allergies as of 10/19/2021       Reactions   Aspirin Palpitations, Other (See Comments)   Listed on [facility] 11/12/18   Metformin And Related         Medication List     TAKE these medications    acetaminophen 325 MG tablet Commonly known as: TYLENOL Take 2 tablets (650 mg total) by mouth every 6 (six) hours as needed for mild pain (or Fever >/= 101).   albuterol 108 (90 Base) MCG/ACT inhaler Commonly known as: VENTOLIN HFA Inhale 2 puffs into the lungs every 4 (four) hours as needed for wheezing or shortness of breath.   ascorbic acid 500 MG tablet Commonly known as: VITAMIN C Take 500 mg by mouth 2 (two) times daily.   Biotin 10 MG Tabs Take 10 mg by mouth daily.   calcitRIOL 0.25 MCG capsule Commonly known as: ROCALTROL Take 1 capsule (0.25 mcg total) by mouth Every Tuesday,Thursday,and Saturday with dialysis.   carvedilol 25 MG tablet Commonly known as: COREG Take 1 tablet (25 mg total) by mouth 2 (two) times daily. What changed:  medication strength how much to take   Darbepoetin Alfa 200 MCG/0.4ML Sosy injection Commonly known as: ARANESP Inject 0.4 mLs (200 mcg total) into the vein every Saturday with hemodialysis. Start taking on: October 25, 2021   ferric citrate 1 GM 210 MG(Fe) tablet Commonly known as: AURYXIA Take 2 tablets (420 mg total) by mouth 3 (three) times daily with meals.   folic acid 1 MG tablet Commonly known as: FOLVITE Take 1 mg by mouth every morning.   hydrALAZINE 50 MG tablet Commonly known as: APRESOLINE Take 1 tablet (50 mg total) by mouth every 8 (eight) hours. What changed: how much to take   insulin aspart 100 UNIT/ML injection Commonly known as: novoLOG Inject 1-3 Units into the skin See admin  instructions. Inject per sliding scale  If BS 151-250= 1 unit 251- 350= 2 units 351-400= 3 units 401-500= 3 units Give subcutaneous at bedtime   levETIRAcetam 500 MG tablet Commonly known as: Keppra Take 1 tablet (500 mg total) by mouth at bedtime. What changed: when to take this   lidocaine-prilocaine cream Commonly known as: EMLA Apply 1 application. topically as directed. Apply to left arm fistula every tues, thurs, and sat for hemodialysis   MULTIVITAMIN ADULT PO Take 1 tablet by mouth daily.   nicotine 14 mg/24hr patch Commonly known as: NICODERM CQ - dosed in mg/24 hours Place 1 patch (14 mg total) onto the skin daily. Start taking on: October 20, 2021   ondansetron 8 MG disintegrating tablet Commonly known as: ZOFRAN-ODT 8mg  ODT q4 hours prn nausea What changed:  how much to take how to take this when to take this reasons to take this additional instructions   oxyCODONE 5 MG immediate release tablet Commonly known as: Oxy IR/ROXICODONE Take 1 tablet (5 mg total) by mouth every 6 (six) hours as needed for severe pain.   pantoprazole 40 MG tablet Commonly known as: Protonix Take 1 tablet (40 mg total) by mouth daily.   piperacillin-tazobactam 2.25 g in sodium chloride 0.9 % 50 mL Inject 2.25 g into the vein every 8 (eight) hours. Administer at hemodialysis   pneumococcal 20-valent conjugate vaccine 0.5 ML injection Commonly known  as: PREVNAR 20 Inject 0.5 mLs into the muscle once for 1 dose.   traZODone 150 MG tablet Commonly known as: DESYREL Take 150 mg by mouth at bedtime.   zinc sulfate 220 (50 Zn) MG capsule Take 220 mg by mouth daily.        Discharge Assessment: Vitals:   10/18/21 2018 10/19/21 0549  BP: (!) 139/58 (!) 123/54  Pulse: 81 81  Resp: 16 16  Temp: 98.1 F (36.7 C) 97.9 F (36.6 C)  SpO2: 100% 100%   Skin clean, dry and intact without evidence of skin break down, no evidence of skin tears noted. IV catheter discontinued  intact. Site without signs and symptoms of complications - no redness or edema noted at insertion site, patient denies c/o pain - only slight tenderness at site.  Dressing with slight pressure applied.  D/c Instructions-Education: Discharge instructions given to patient/family with verbalized understanding. D/c education completed with patient/family including follow up instructions, medication list, d/c activities limitations if indicated, with other d/c instructions as indicated by MD - patient able to verbalize understanding, all questions fully answered. Patient instructed to return to ED, call 911, or call MD for any changes in condition.  Patient escorted via [place], and D/C home via private auto.  Kathie Rhodes, RN 10/19/2021 9:25 AM

## 2021-10-19 NOTE — TOC Transition Note (Addendum)
Transition of Care [facility]) - CM/SW Discharge Note   Patient Details  Name: Erica [name] MRN: [id number] Date of Birth: 1955/06/28  [facility] CM/SW Contact:  Joanne Chars, LCSW Phone Number: 10/19/2021, 10:08 AM   Clinical Narrative:   Pt discharging to [facility], room 126A.  RN call [phone] for report.      Final next level of care: Skilled Nursing Facility Barriers to Discharge: Barriers Resolved   Patient Goals and CMS Choice        Discharge Placement              Patient chooses bed at:  [facility]) Patient to be transferred to facility by: [place] Name of family member notified: son CAlvin Patient and family notified of of transfer: 10/19/21  Discharge Plan and Services     Post Acute Care Choice: [place]                               Social Determinants of Health (SDOH) Interventions     Readmission Risk Interventions    08/20/2020   11:24 AM 06/11/2020   12:31 PM 11/19/2019   11:36 AM  Readmission Risk Prevention Plan  Transportation Screening Complete Complete Complete  Medication Review [employer]) Complete Complete Complete  PCP or Specialist appointment within 3-5 days of discharge Complete  Complete  HRI or Home Care Consult Complete Complete Complete  SW Recovery Care/Counseling Consult Complete Complete Complete  Palliative Care Screening Not Applicable Not Applicable Complete  Skilled Nursing Facility Complete Complete Complete

## 2021-10-19 NOTE — Discharge Summary (Signed)
Physician Discharge Summary  NEYTIRI ASCHE OHY:[id number] DOB: 09-10-1955 DOA: 10/12/2021  PCP: System, Provider Not In  Admit date: 10/12/2021 Discharge date: 10/19/2021  Time spent: 35 minutes  Recommendations for Outpatient Follow-up:   Nausea and vomiting, intermittent diarrhea probably from colitis and possible gastritis.  -Symptomatic  management with IV PPI, IV pain control, IV zofran.  -Complete course ABX 10 to 14 days .  -Blood cultures: Not drawn -8/27 mitochondrial M2 antibody, IgG negative -Abdominal symptoms resolved -C diff  pcr is negative.   -8/31 per GI recommend             Complete course of antibiotics. F/U IgM and AMA results.  Outpatient colonoscopy and esophagogastroduodenoscopy in 6-8 weeks. -8/31 discussed case with nephrology patient not able to consume current diet very well changed to Dysphagia 3, 1500 ml fluid restriction. - 8/31 all cultures negative, patient continues to have diarrhea - 8/31 Imodium 2 mg QID -9/2 since patient does not have SNF yet hold on changing ABX to p.o.   Acute anemia of blood loss Suspect GI blood loss as she was found to have hemoccult positive stools.  GI consulted and recommendations given.  -Transfuse for hemoglobin <7 Lab Results  Component Value Date   HGB 8.4 (L) 10/19/2021   HGB 8.0 (L) 10/18/2021   HGB 7.7 (L) 10/17/2021   HGB 8.3 (L) 10/15/2021   HGB 8.4 (L) 10/14/2021  -8/27 fecal occult positive -9/3 stable  ESRD HD T/TH/Sat -Missed sat HD due to not feeling well.  -Nephrology on board.  -Complete antibiotics at HD   Mild Elevated troponins from type II demand ischemia -EKG doe snot show any ischemic changes.  -Pt denies any chest pain.  -Resolved   Seizure disorder  -Keppra 500 mg daily     GERD Resume PPI.      Macrocytic anemia:  -Anemia panel showing iron levels at 39, ferritin at 1582, folate is >40 and vit b 12 is 1598.    Mild Thrombocytopenia:  -Continue to monitor.      DM  type II controlled with Hyperglycemia on long-term insulin -8/31 Hemoglobin A1c= 5.5 - 8/31 LDL=53 within goal   Essential HTN - 9/1 increase Coreg 25 mg BID - Hydralazine 50 mg TID -9/2 patient scheduled for HD today, if BP still elevated in a.m. will adjust medication.   Hyperkalemia:  -Secondary to missing ESRD - Resolved.    Hypocalcemia -Calcium goal> 8.9 -8/31 corrected calcium =9.2 no action required at this time.       Hypophosphatemia -Phosphorus goal> 2.5     Discharge Diagnoses:  Principal Problem:   Colitis Active Problems:   Nausea & vomiting   Seizure disorder (HCC)   Essential hypertension   ESRD on dialysis (HCC)   Anemia   Hyponatremia   Hyperkalemia   Thrombocytopenia (HCC)   Macrocytic anemia   GI bleed   Elevated troponin   GERD (gastroesophageal reflux disease)   Benign essential HTN   Discharge Condition: Stable  Diet recommendation: Heart healthy/carb modified  Filed Weights   10/16/21 1035 10/18/21 1335 10/18/21 1836  Weight: 41.3 kg 43.1 kg 41 kg    History of present illness:  66 y.o. BF PMHx DM type II, HTN, HLD, ESRD on HD (TTS) seizure disorder, CAD      Presents to the emergency department via EMS due to nausea and vomiting which started  the day before the admission.  Patient has chronic confusion, patient missed her dialysis  on  Saturday, due to not feeling well. On arrival to ED, she was found be anemic.  CT abdomen and pelvis without contrast showed rectosigmoid wall thickening and inflammation compatible with proctocolitis with questionable ascending colitis and questionable gastritis.  Small volume ascites and body wall edema and nonobstructing bilateral renal calculi also noted. CT head without contrast showed no acute intracranial abnormality Patient was treated with IV fentanyl and, Zofran was given, Lokelma was given due to hyperkalemia and she was empirically started on Zosyn due to presumed colitis. Hospitalist  was asked to admit patient for further evaluation and management. GI consulted for colitis and hemoccult positive stools and acute blood loss anemia and recommendations to monitor for now.       Hospital Course:  See above  Procedures: 8/27 positive fecal occult blood      Cultures  8/29 MRSA by PCR negative 8/29 GI panel negative 8/29 C. difficile antigen negative 8/29 C. difficile toxin negative 8/29 SARS coronavirus negative  Antibiotics Anti-infectives (From admission, onward)    Start     Ordered Stop   Discharge       10/13/21 0600  piperacillin-tazobactam (ZOSYN) IVPB 2.25 g  Status:  Discontinued        10/12/21 2249 10/13/21 0806   10/12/21 2300  piperacillin-tazobactam (ZOSYN) IVPB 3.375 g        10/12/21 2250 10/12/21 2340         Discharge Exam: Vitals:   10/18/21 1836 10/18/21 1850 10/18/21 2018 10/19/21 0549  BP:  (!) 151/59 (!) 139/58 (!) 123/54  Pulse:  81 81 81  Resp:  18 16 16   Temp:  98.5 F (36.9 C) 98.1 F (36.7 C) 97.9 F (36.6 C)  TempSrc:  Oral Oral Oral  SpO2:  100% 100% 100%  Weight: 41 kg     Height:        General: A/O x4, No acute respiratory distress, cachectic Lungs: Clear to auscultation bilaterally without wheezes or crackles Cardiovascular: Regular rate and rhythm without murmur gallop or rub normal S1 and S2 Abdomen: Nontender, nondistended, soft, bowel sounds positive, no rebound, no ascites, no appreciable mass   Discharge Instructions   Allergies as of 10/19/2021       Reactions   Aspirin Palpitations, Other (See Comments)   Listed on [facility] 11/12/18   Metformin And Related         Medication List     TAKE these medications    acetaminophen 325 MG tablet Commonly known as: TYLENOL Take 2 tablets (650 mg total) by mouth every 6 (six) hours as needed for mild pain (or Fever >/= 101).   albuterol 108 (90 Base) MCG/ACT inhaler Commonly known as: VENTOLIN HFA Inhale 2 puffs into the lungs every 4 (four) hours  as needed for wheezing or shortness of breath.   ascorbic acid 500 MG tablet Commonly known as: VITAMIN C Take 500 mg by mouth 2 (two) times daily.   Biotin 10 MG Tabs Take 10 mg by mouth daily.   calcitRIOL 0.25 MCG capsule Commonly known as: ROCALTROL Take 1 capsule (0.25 mcg total) by mouth Every Tuesday,Thursday,and Saturday with dialysis.   carvedilol 25 MG tablet Commonly known as: COREG Take 1 tablet (25 mg total) by mouth 2 (two) times daily. What changed:  medication strength how much to take   Darbepoetin Alfa 200 MCG/0.4ML Sosy injection Commonly known as: ARANESP Inject 0.4 mLs (200 mcg total) into the vein every Saturday with hemodialysis. Start taking on: October 25, 2021   ferric citrate 1 GM 210 MG(Fe) tablet Commonly known as: AURYXIA Take 2 tablets (420 mg total) by mouth 3 (three) times daily with meals.   folic acid 1 MG tablet Commonly known as: FOLVITE Take 1 mg by mouth every morning.   hydrALAZINE 50 MG tablet Commonly known as: APRESOLINE Take 1 tablet (50 mg total) by mouth every 8 (eight) hours. What changed: how much to take   insulin aspart 100 UNIT/ML injection Commonly known as: novoLOG Inject 1-3 Units into the skin See admin instructions. Inject per sliding scale  If BS 151-250= 1 unit 251- 350= 2 units 351-400= 3 units 401-500= 3 units Give subcutaneous at bedtime   levETIRAcetam 500 MG tablet Commonly known as: Keppra Take 1 tablet (500 mg total) by mouth at bedtime. What changed: when to take this   lidocaine-prilocaine cream Commonly known as: EMLA Apply 1 application. topically as directed. Apply to left arm fistula every tues, thurs, and sat for hemodialysis   MULTIVITAMIN ADULT PO Take 1 tablet by mouth daily.   nicotine 14 mg/24hr patch Commonly known as: NICODERM CQ - dosed in mg/24 hours Place 1 patch (14 mg total) onto the skin daily. Start taking on: October 20, 2021   ondansetron 8 MG disintegrating  tablet Commonly known as: ZOFRAN-ODT 8mg  ODT q4 hours prn nausea What changed:  how much to take how to take this when to take this reasons to take this additional instructions   oxyCODONE 5 MG immediate release tablet Commonly known as: Oxy IR/ROXICODONE Take 1 tablet (5 mg total) by mouth every 6 (six) hours as needed for severe pain.   pantoprazole 40 MG tablet Commonly known as: Protonix Take 1 tablet (40 mg total) by mouth daily.   piperacillin-tazobactam 2.25 g in sodium chloride 0.9 % 50 mL Inject 2.25 g into the vein every 8 (eight) hours. Administer at hemodialysis   pneumococcal 20-valent conjugate vaccine 0.5 ML injection Commonly known as: PREVNAR 20 Inject 0.5 mLs into the muscle once for 1 dose.   traZODone 150 MG tablet Commonly known as: DESYREL Take 150 mg by mouth at bedtime.   zinc sulfate 220 (50 Zn) MG capsule Take 220 mg by mouth daily.       Allergies  Allergen Reactions   Aspirin Palpitations and Other (See Comments)    Listed on [facility] 11/12/18   Metformin And Related     Contact information for after-discharge care     Destination     [facility] .   Service: Skilled Nursing Contact information: [place] [phone]                      The results of significant diagnostics from this hospitalization (including imaging, microbiology, ancillary and laboratory) are listed below for reference.    Significant Diagnostic Studies: DG Chest Portable 1 View  Result Date: 10/12/2021 CLINICAL DATA:  Nausea and vomiting. EXAM: PORTABLE CHEST 1 VIEW COMPARISON:  August 24, 2021 FINDINGS: The cardiac silhouette is borderline in size. There is no evidence of acute infiltrate, pleural effusion or pneumothorax. The visualized skeletal structures are unremarkable. IMPRESSION: No active cardiopulmonary disease. Electronically Signed   By: Virgina Norfolk M.D.   On: 10/12/2021 19:04   CT  ABDOMEN PELVIS WO CONTRAST  Result Date: 10/12/2021 CLINICAL DATA:  Nausea and vomiting. EXAM: CT ABDOMEN AND PELVIS WITHOUT CONTRAST TECHNIQUE: Multidetector CT imaging of the abdomen and pelvis was  performed following the standard protocol without IV contrast. RADIATION DOSE REDUCTION: This exam was performed according to the departmental dose-optimization program which includes automated exposure control, adjustment of the mA and/or kV according to patient size and/or use of iterative reconstruction technique. COMPARISON:  CT abdomen and pelvis 08/15/2020. FINDINGS: Lower chest: No acute abnormality.  Heart is enlarged. Hepatobiliary: No focal liver abnormality is seen. Status post cholecystectomy. No biliary dilatation. Pancreas: Pancreas is diffusely atrophic with coarse calcifications compatible with chronic pancreatitis, unchanged. No acute inflammatory changes. Spleen: Normal in size without focal abnormality. Adrenals/Urinary Tract: There is some punctate nonobstructing bilateral renal calculi. Prominent renal vascular calcifications are again seen. There is no hydronephrosis. No ureteral calculi are seen. The adrenal glands are within normal limits. The bladder is under distended and not well evaluated. Stomach/Bowel: There is rectosigmoid wall thickening with mild surrounding inflammation. No dilated bowel loops, pneumatosis or free air. There is questionable wall thickening of the ascending colon. Small bowel is within normal limits. The stomach is decompressed. Gastric wall thickening not excluded. Vascular/Lymphatic: No significant vascular findings are present. No enlarged abdominal or pelvic lymph nodes. Extensive peripheral vascular calcifications are present. Reproductive: Uterus and bilateral adnexa are unremarkable. Other: Small volume ascites.  Body wall edema.  No focal hernia. Musculoskeletal: The bones are osteopenic. Chronic compression deformities of T10, T12 and L2 appear similar to the  prior study. Right hip arthroplasty is similar to the prior study. IMPRESSION: 1. Rectosigmoid wall thickening and inflammation compatible with proctocolitis. 2. Questionable ascending colitis. 3. Questionable gastritis. 4. Small volume ascites and body wall edema. 5. Nonobstructing bilateral renal calculi. 6.  Aortic Atherosclerosis (ICD10-I70.0). Electronically Signed   By: Ronney Asters M.D.   On: 10/12/2021 19:03   CT Head Wo Contrast  Result Date: 10/12/2021 CLINICAL DATA:  Mental status change EXAM: CT HEAD WITHOUT CONTRAST TECHNIQUE: Contiguous axial images were obtained from the base of the skull through the vertex without intravenous contrast. RADIATION DOSE REDUCTION: This exam was performed according to the departmental dose-optimization program which includes automated exposure control, adjustment of the mA and/or kV according to patient size and/or use of iterative reconstruction technique. COMPARISON:  Head CT dated August 21, 2021 FINDINGS: Brain: No evidence of acute infarction, hemorrhage, hydrocephalus, extra-axial collection or mass lesion/mass effect. Vascular: No hyperdense vessel or unexpected calcification. Skull: Normal. Negative for fracture or focal lesion. Sinuses/Orbits: No acute finding. Other: None. IMPRESSION: No acute intracranial abnormality. Electronically Signed   By: Yetta Glassman M.D.   On: 10/12/2021 18:57    Microbiology: Recent Results (from the past 240 hour(s))  Resp Panel by RT-PCR (Flu A&B, Covid) Anterior Nasal Swab     Status: None   Collection Time: 10/14/21  1:30 AM   Specimen: Anterior Nasal Swab  Result Value Ref Range Status   SARS Coronavirus 2 by RT PCR NEGATIVE NEGATIVE Final    Comment: (NOTE) SARS-CoV-2 target nucleic acids are NOT DETECTED.  The SARS-CoV-2 RNA is generally detectable in upper respiratory specimens during the acute phase of infection. The lowest concentration of SARS-CoV-2 viral copies this assay can detect is 138 copies/mL.  A negative result does not preclude SARS-Cov-2 infection and should not be used as the sole basis for treatment or other patient management decisions. A negative result may occur with  improper specimen collection/handling, submission of specimen other than nasopharyngeal swab, presence of viral mutation(s) within the areas targeted by this assay, and inadequate number of viral copies(<138 copies/mL). A negative result must be  combined with clinical observations, patient history, and epidemiological information. The expected result is Negative.  Fact Sheet for Patients:  EntrepreneurPulse.com.au  Fact Sheet for Healthcare Providers:  IncredibleEmployment.be  This test is no t yet approved or cleared by the [place] FDA and  has been authorized for detection and/or diagnosis of SARS-CoV-2 by FDA under an Emergency Use Authorization (EUA). This EUA will remain  in effect (meaning this test can be used) for the duration of the COVID-19 declaration under Section 564(b)(1) of the Act, 21 U.S.C.section 360bbb-3(b)(1), unless the authorization is terminated  or revoked sooner.       Influenza A by PCR NEGATIVE NEGATIVE Final   Influenza B by PCR NEGATIVE NEGATIVE Final    Comment: (NOTE) The Xpert Xpress SARS-CoV-2/FLU/RSV plus assay is intended as an aid in the diagnosis of influenza from Nasopharyngeal swab specimens and should not be used as a sole basis for treatment. Nasal washings and aspirates are unacceptable for Xpert Xpress SARS-CoV-2/FLU/RSV testing.  Fact Sheet for Patients: EntrepreneurPulse.com.au  Fact Sheet for Healthcare Providers: IncredibleEmployment.be  This test is not yet approved or cleared by the [place] FDA and has been authorized for detection and/or diagnosis of SARS-CoV-2 by FDA under an Emergency Use Authorization (EUA). This EUA will remain in effect (meaning this test  can be used) for the duration of the COVID-19 declaration under Section 564(b)(1) of the Act, 21 U.S.C. section 360bbb-3(b)(1), unless the authorization is terminated or revoked.  Performed at [facility], [address], [place], [place] [address]   MRSA Next Gen by PCR, Nasal     Status: None   Collection Time: 10/14/21  1:30 AM   Specimen: Nasal Mucosa; Nasal Swab  Result Value Ref Range Status   MRSA by PCR Next Gen NOT DETECTED NOT DETECTED Final    Comment: (NOTE) The GeneXpert MRSA Assay (FDA approved for NASAL specimens only), is one component of a comprehensive MRSA colonization surveillance program. It is not intended to diagnose MRSA infection nor to guide or monitor treatment for MRSA infections. Test performance is not FDA approved in patients less than 23 years old. Performed at [facility], [address], [place], [place] [address]   C Difficile Quick Screen w PCR reflex     Status: None   Collection Time: 10/14/21 12:17 PM   Specimen: Stool  Result Value Ref Range Status   C Diff antigen NEGATIVE NEGATIVE Final   C Diff toxin NEGATIVE NEGATIVE Final   C Diff interpretation No C. difficile detected.  Final    Comment: Performed at [id number], [address], [place],  [address]  Gastrointestinal Panel by PCR , Stool     Status: None   Collection Time: 10/14/21 12:17 PM   Specimen: Stool  Result Value Ref Range Status   Campylobacter species NOT DETECTED NOT DETECTED Final   Plesimonas shigelloides NOT DETECTED NOT DETECTED Final   Salmonella species NOT DETECTED NOT DETECTED Final   Yersinia enterocolitica NOT DETECTED NOT DETECTED Final   Vibrio species NOT DETECTED NOT DETECTED Final   Vibrio cholerae NOT DETECTED NOT DETECTED Final   Enteroaggregative E coli (EAEC) NOT DETECTED NOT DETECTED Final   Enteropathogenic E coli (EPEC) NOT DETECTED NOT DETECTED Final   Enterotoxigenic E coli (ETEC) NOT DETECTED NOT DETECTED Final   Shiga like toxin  producing E coli (STEC) NOT DETECTED NOT DETECTED Final   Shigella/Enteroinvasive E coli (EIEC) NOT DETECTED NOT DETECTED Final   Cryptosporidium NOT DETECTED NOT  DETECTED Final   Cyclospora cayetanensis NOT DETECTED NOT DETECTED Final   Entamoeba histolytica NOT DETECTED NOT DETECTED Final   Giardia lamblia NOT DETECTED NOT DETECTED Final   Adenovirus F40/41 NOT DETECTED NOT DETECTED Final   Astrovirus NOT DETECTED NOT DETECTED Final   Norovirus GI/GII NOT DETECTED NOT DETECTED Final   Rotavirus A NOT DETECTED NOT DETECTED Final   Sapovirus (I, II, IV, and V) NOT DETECTED NOT DETECTED Final    Comment: Performed at [facility], [address] [place], [place] [address]     Labs: Basic Metabolic Panel: Recent Labs  Lab 10/13/21 0517 10/14/21 0744 10/15/21 0748 10/17/21 0444 10/18/21 0528 10/19/21 0502  NA 135 135 140 142 142 140  K 5.2* 4.8 3.7 2.9* 3.9 3.6  CL 101 102 106 108 110 107  CO2 27 23 26 27 27 28   GLUCOSE 136* 73 246* 153* 137* 204*  BUN 38* 42* 18 14 20 14   CREATININE 6.18* 6.54* 3.53* 2.60* 3.68* 2.35*  CALCIUM 9.7 9.2 8.0* 8.0* 8.1* 7.8*  MG 2.6*  --  2.1 1.8 1.8 1.7  PHOS 1.5*  --  2.1* 2.5 3.7 2.6   Liver Function Tests: Recent Labs  Lab 10/14/21 0744 10/15/21 0748 10/17/21 0444 10/18/21 0528 10/19/21 0502  AST 24 23 22 20  40  ALT 28 27 24 24 28   ALKPHOS 205* 192* 177* 161* 159*  BILITOT 0.8 0.7 0.7 0.7 0.7  PROT 5.3* 5.2* 4.9* 4.8* 4.8*  ALBUMIN 2.5* 2.5* 2.3* 2.2* 2.2*   Recent Labs  Lab 10/12/21 2001  LIPASE 19   No results for input(s): "AMMONIA" in the last 168 hours. CBC: Recent Labs  Lab 10/12/21 2001 10/13/21 0517 10/14/21 0744 10/15/21 0748 10/17/21 0444 10/18/21 0528 10/19/21 0502  WBC 5.2   < > 3.6* 3.6* 3.6* 3.7* 4.0  NEUTROABS 3.2  --   --  1.8 1.9 1.9 2.1  HGB 9.3*   < > 8.4* 8.3* 7.7* 8.0* 8.4*  HCT 29.6*   < > 27.7* 27.6* 25.1* 26.2* 28.0*  MCV 101.7*   < > 106.1* 104.9* 104.1* 104.4* 105.7*  PLT 114*    < > 104* 110* 89* 84* 80*   < > = values in this interval not displayed.   Cardiac Enzymes: No results for input(s): "CKTOTAL", "CKMB", "CKMBINDEX", "TROPONINI" in the last 168 hours. BNP: BNP (last 3 results) No results for input(s): "BNP" in the last 8760 hours.  ProBNP (last 3 results) No results for input(s): "PROBNP" in the last 8760 hours.  CBG: Recent Labs  Lab 10/18/21 0723 10/18/21 1116 10/18/21 1609 10/18/21 2119 10/19/21 0714  GLUCAP 151* 194* 197* 318* 188*       Signed:  Dia Crawford, MD [facility]

## 2021-10-19 NOTE — TOC Progression Note (Signed)
Transition of Care [facility]) - Progression Note    Patient Details  Name: Erica [name] MRN: [id number] Date of Birth: 04-06-1955  Transition of Care [facility]) CM/SW Contact  Joanne Chars, LCSW Phone Number: 10/19/2021, 8:47 AM  Clinical Narrative:   CSW informed by [facility] that they can accept pt for admission tomorrow.      Expected Discharge Plan: [place] Barriers to Discharge: Continued Medical Work up  Expected Discharge Plan and Services Expected Discharge Plan: [place] Choice: [place] arrangements for the past 2 months: Single Family Home                                       Social Determinants of Health (SDOH) Interventions    Readmission Risk Interventions    08/20/2020   11:24 AM 06/11/2020   12:31 PM 11/19/2019   11:36 AM  Readmission Risk Prevention Plan  Transportation Screening Complete Complete Complete  Medication Review Press photographer) Complete Complete Complete  PCP or Specialist appointment within 3-5 days of discharge Complete  Complete  HRI or Home Care Consult Complete Complete Complete  SW Recovery Care/Counseling Consult Complete Complete Complete  Palliative Care Screening Not Applicable Not Applicable Complete  Skilled Nursing Facility Complete Complete Complete

## 2021-10-22 NOTE — Telephone Encounter (Signed)
Scheduled with [facility] for 9/25 @ 130pm

## 2021-10-23 NOTE — Telephone Encounter (Signed)
I scheduled this patient with [facility] for 9/25 @ 1:30

## 2021-10-29 ENCOUNTER — Encounter: Payer: Self-pay | Admitting: Vascular Surgery

## 2021-10-29 ENCOUNTER — Ambulatory Visit (INDEPENDENT_AMBULATORY_CARE_PROVIDER_SITE_OTHER): Payer: Medicare Other | Admitting: Vascular Surgery

## 2021-10-29 VITALS — BP 198/82 | HR 75 | Temp 97.0°F | Ht 59.0 in

## 2021-10-29 DIAGNOSIS — N186 End stage renal disease: Secondary | ICD-10-CM | POA: Diagnosis not present

## 2021-10-29 DIAGNOSIS — Z992 Dependence on renal dialysis: Secondary | ICD-10-CM | POA: Diagnosis not present

## 2021-10-29 NOTE — H&P (View-Only) (Signed)
Vascular and Vein Specialist of [place]  Patient name: Erica [name] MRN: [id number] DOB: 1955-12-30 Sex: female  REASON FOR CONSULT: Evaluation of left upper arm AV Gore-Tex graft with aneurysmal change  HPI: Erica [name] is a 66 y.o. female, who is here today for evaluation of her left upper arm AV graft.  She was referred by the hemodialysis center.  She has had progressive aneurysmal change and thinning of the left upper arm arterial portion of her graft.  This graft was initially placed by myself in 2019.  He had a small brachial artery and a high bifurcation.  I elected to place an upper arm loop.  The graft initially was mainly used in the more lateral portion which has become quite aneurysmal.  The dialysis team is now using the medial portion of the loop successfully.  She currently undergoes dialysis on Tuesday Thursday and Saturday.  Past Medical History:  Diagnosis Date   Alcohol-induced pancreatitis    CAD (coronary artery disease) 09/17/2020   Chronic diarrhea    Depression    Diabetes mellitus    fasting blood sugar 110-120s   Dialysis patient ([place])    Diastolic CHF ([place])    DKA (diabetic ketoacidoses)    Gastroparesis    GERD (gastroesophageal reflux disease)    Heart murmur    History of kidney stones    Hyperlipidemia    Hypertension    Hypokalemia    Muscle spasm    Neuropathic pain    Neuropathy    Hx: of   Pyelonephritis    Seizures (HCC)    Vitamin B12 deficiency    Vitamin D deficiency     Family History  Problem Relation Age of Onset   Diabetes Sister    Chronic Renal Failure Neg Hx    Colon cancer Neg Hx    Colon polyps Neg Hx     SOCIAL HISTORY: Social History   Socioeconomic History   Marital status: Legally Separated    Spouse name: Not on file   Number of children: Not on file   Years of education: Not on file   Highest education level: Not on file  Occupational History   Not on file   Tobacco Use   Smoking status: Never   Smokeless tobacco: Current    Types: Snuff  Vaping Use   Vaping Use: Never used  Substance and Sexual Activity   Alcohol use: Not Currently    Alcohol/week: 1.0 standard drink of alcohol    Types: 1 Cans of beer per week   Drug use: No   Sexual activity: Not Currently  Other Topics Concern   Not on file  Social History Narrative   Not on file   Social Determinants of Health   Financial Resource Strain: Not on file  Food Insecurity: Not on file  Transportation Needs: Not on file  Physical Activity: Not on file  Stress: Not on file  Social Connections: Not on file  Intimate Partner Violence: Not on file    Allergies  Allergen Reactions   Aspirin Palpitations and Other (See Comments)    Listed on [facility] 11/12/18   Metformin And Related     Current Outpatient Medications  Medication Sig Dispense Refill   acetaminophen (TYLENOL) 325 MG tablet Take 2 tablets (650 mg total) by mouth every 6 (six) hours as needed for mild pain (or Fever >/= 101). 30 tablet 0   albuterol (VENTOLIN HFA) 108 (90 Base) MCG/ACT inhaler Inhale  2 puffs into the lungs every 4 (four) hours as needed for wheezing or shortness of breath.      Biotin 10 MG TABS Take 10 mg by mouth daily.     calcitRIOL (ROCALTROL) 0.25 MCG capsule Take 1 capsule (0.25 mcg total) by mouth Every Tuesday,Thursday,and Saturday with dialysis.     carvedilol (COREG) 25 MG tablet Take 1 tablet (25 mg total) by mouth 2 (two) times daily. 60 tablet 0   Darbepoetin Alfa (ARANESP) 200 MCG/0.4ML SOSY injection Inject 0.4 mLs (200 mcg total) into the vein every Saturday with hemodialysis. 1.68 mL 0   ferric citrate (AURYXIA) 1 GM 210 MG(Fe) tablet Take 2 tablets (420 mg total) by mouth 3 (three) times daily with meals. 010 tablet    folic acid (FOLVITE) 1 MG tablet Take 1 mg by mouth every morning.     hydrALAZINE (APRESOLINE) 50 MG tablet Take 1 tablet (50 mg total) by mouth every 8 (eight) hours. 90  tablet 0   insulin aspart (NOVOLOG) 100 UNIT/ML injection Inject 1-3 Units into the skin See admin instructions. Inject per sliding scale  If BS 151-250= 1 unit 251- 350= 2 units 351-400= 3 units 401-500= 3 units Give subcutaneous at bedtime     levETIRAcetam (KEPPRA) 500 MG tablet Take 1 tablet (500 mg total) by mouth at bedtime. 30 tablet 0   lidocaine-prilocaine (EMLA) cream Apply 1 application. topically as directed. Apply to left arm fistula every tues, thurs, and sat for hemodialysis     Multiple Vitamin (MULTIVITAMIN ADULT PO) Take 1 tablet by mouth daily.     nicotine (NICODERM CQ - DOSED IN MG/24 HOURS) 14 mg/24hr patch Place 1 patch (14 mg total) onto the skin daily. 28 patch 0   ondansetron (ZOFRAN-ODT) 8 MG disintegrating tablet 8mg  ODT q4 hours prn nausea (Patient taking differently: Take 4 mg by mouth every 8 (eight) hours as needed for nausea or vomiting.) 10 tablet 0   oxyCODONE (OXY IR/ROXICODONE) 5 MG immediate release tablet Take 1 tablet (5 mg total) by mouth every 6 (six) hours as needed for severe pain. 10 tablet 0   piperacillin-tazobactam 2.25 g in sodium chloride 0.9 % 50 mL Inject 2.25 g into the vein every 8 (eight) hours. Administer at hemodialysis 27 g 0   traZODone (DESYREL) 150 MG tablet Take 150 mg by mouth at bedtime.     vitamin C (ASCORBIC ACID) 500 MG tablet Take 500 mg by mouth 2 (two) times daily.     zinc sulfate 220 (50 Zn) MG capsule Take 220 mg by mouth daily.     pantoprazole (PROTONIX) 40 MG tablet Take 1 tablet (40 mg total) by mouth daily. 30 tablet 1   No current facility-administered medications for this visit.    REVIEW OF SYSTEMS:  [X]  denotes positive finding, [ ]  denotes negative finding Cardiac  Comments:  Chest pain or chest pressure:    Shortness of breath upon exertion:    Short of breath when lying flat:    Irregular heart rhythm:        Vascular    Pain in calf, thigh, or hip brought on by ambulation:    Pain in feet at night  that wakes you up from your sleep:     Blood clot in your veins:    Leg swelling:         Pulmonary    Oxygen at home:    Productive cough:     Wheezing:  Neurologic    Sudden weakness in arms or legs:     Sudden numbness in arms or legs:     Sudden onset of difficulty speaking or slurred speech:    Temporary loss of vision in one eye:     Problems with dizziness:         Gastrointestinal    Blood in stool:     Vomited blood:         Genitourinary    Burning when urinating:     Blood in urine:        Psychiatric    Major depression:         Hematologic    Bleeding problems:    Problems with blood clotting too easily:        Skin    Rashes or ulcers:        Constitutional    Fever or chills:      PHYSICAL EXAM: Vitals:   10/29/21 1438  BP: (!) 198/82  Pulse: 75  Temp: (!) 97 F (36.1 C)  SpO2: 98%  Height: 4\' 11"  (1.499 m)    GENERAL: The patient is a well-nourished female, in no acute distress. The vital signs are documented above. CARDIOVASCULAR: Left upper arm AV loop graft.  She does have 1-2+ left radial pulse.  There is aneurysmal degeneration over the entire prior access area over the lateral aspect of her loop and an area on the most proximal portion of this with extreme thinning of the skin. PULMONARY: There is good air exchange  MUSCULOSKELETAL: There are no major deformities or cyanosis. NEUROLOGIC: No focal weakness or paresthesias are detected. SKIN: There are no ulcers or rashes noted. PSYCHIATRIC: The patient has a normal affect.  DATA:  None  MEDICAL ISSUES: I discussed options with the patient.  Concern regarding the severe thinning of skin over the aneurysmal portion of her graft.  I would recommend operative revision.  I explained that we would replace the lateral half of her loop graft.  This would allow her to continue the medial segment for 1 month and then could use the entire portion of the graft following this.  We will  coordinate this at [facility] for 11/04/2021 as an outpatient.  We will coordinate changing her dialysis day from Tuesday.   Rosetta Posner, MD FACS [facility] Vein Specialists of [facility] [phone] Pager [phone]  Note: Portions of this report may have been transcribed using voice recognition software.  Every effort has been made to ensure accuracy; however, inadvertent computerized transcription errors may still be present.

## 2021-10-29 NOTE — Progress Notes (Signed)
Vascular and Vein Specialist of [place]  Patient name: Erica [name] MRN: [id number] DOB: 1955/12/17 Sex: female  REASON FOR CONSULT: Evaluation of left upper arm AV Gore-Tex graft with aneurysmal change  HPI: Erica [name] is a 66 y.o. female, who is here today for evaluation of her left upper arm AV graft.  She was referred by the hemodialysis center.  She has had progressive aneurysmal change and thinning of the left upper arm arterial portion of her graft.  This graft was initially placed by myself in 2019.  He had a small brachial artery and a high bifurcation.  I elected to place an upper arm loop.  The graft initially was mainly used in the more lateral portion which has become quite aneurysmal.  The dialysis team is now using the medial portion of the loop successfully.  She currently undergoes dialysis on Tuesday Thursday and Saturday.  Past Medical History:  Diagnosis Date   Alcohol-induced pancreatitis    CAD (coronary artery disease) 09/17/2020   Chronic diarrhea    Depression    Diabetes mellitus    fasting blood sugar 110-120s   Dialysis patient ([place])    Diastolic CHF ([place])    DKA (diabetic ketoacidoses)    Gastroparesis    GERD (gastroesophageal reflux disease)    Heart murmur    History of kidney stones    Hyperlipidemia    Hypertension    Hypokalemia    Muscle spasm    Neuropathic pain    Neuropathy    Hx: of   Pyelonephritis    Seizures (HCC)    Vitamin B12 deficiency    Vitamin D deficiency     Family History  Problem Relation Age of Onset   Diabetes Sister    Chronic Renal Failure Neg Hx    Colon cancer Neg Hx    Colon polyps Neg Hx     SOCIAL HISTORY: Social History   Socioeconomic History   Marital status: Legally Separated    Spouse name: Not on file   Number of children: Not on file   Years of education: Not on file   Highest education level: Not on file  Occupational History   Not on file   Tobacco Use   Smoking status: Never   Smokeless tobacco: Current    Types: Snuff  Vaping Use   Vaping Use: Never used  Substance and Sexual Activity   Alcohol use: Not Currently    Alcohol/week: 1.0 standard drink of alcohol    Types: 1 Cans of beer per week   Drug use: No   Sexual activity: Not Currently  Other Topics Concern   Not on file  Social History Narrative   Not on file   Social Determinants of Health   Financial Resource Strain: Not on file  Food Insecurity: Not on file  Transportation Needs: Not on file  Physical Activity: Not on file  Stress: Not on file  Social Connections: Not on file  Intimate Partner Violence: Not on file    Allergies  Allergen Reactions   Aspirin Palpitations and Other (See Comments)    Listed on [facility] 11/12/18   Metformin And Related     Current Outpatient Medications  Medication Sig Dispense Refill   acetaminophen (TYLENOL) 325 MG tablet Take 2 tablets (650 mg total) by mouth every 6 (six) hours as needed for mild pain (or Fever >/= 101). 30 tablet 0   albuterol (VENTOLIN HFA) 108 (90 Base) MCG/ACT inhaler Inhale  2 puffs into the lungs every 4 (four) hours as needed for wheezing or shortness of breath.      Biotin 10 MG TABS Take 10 mg by mouth daily.     calcitRIOL (ROCALTROL) 0.25 MCG capsule Take 1 capsule (0.25 mcg total) by mouth Every Tuesday,Thursday,and Saturday with dialysis.     carvedilol (COREG) 25 MG tablet Take 1 tablet (25 mg total) by mouth 2 (two) times daily. 60 tablet 0   Darbepoetin Alfa (ARANESP) 200 MCG/0.4ML SOSY injection Inject 0.4 mLs (200 mcg total) into the vein every Saturday with hemodialysis. 1.68 mL 0   ferric citrate (AURYXIA) 1 GM 210 MG(Fe) tablet Take 2 tablets (420 mg total) by mouth 3 (three) times daily with meals. 267 tablet    folic acid (FOLVITE) 1 MG tablet Take 1 mg by mouth every morning.     hydrALAZINE (APRESOLINE) 50 MG tablet Take 1 tablet (50 mg total) by mouth every 8 (eight) hours. 90  tablet 0   insulin aspart (NOVOLOG) 100 UNIT/ML injection Inject 1-3 Units into the skin See admin instructions. Inject per sliding scale  If BS 151-250= 1 unit 251- 350= 2 units 351-400= 3 units 401-500= 3 units Give subcutaneous at bedtime     levETIRAcetam (KEPPRA) 500 MG tablet Take 1 tablet (500 mg total) by mouth at bedtime. 30 tablet 0   lidocaine-prilocaine (EMLA) cream Apply 1 application. topically as directed. Apply to left arm fistula every tues, thurs, and sat for hemodialysis     Multiple Vitamin (MULTIVITAMIN ADULT PO) Take 1 tablet by mouth daily.     nicotine (NICODERM CQ - DOSED IN MG/24 HOURS) 14 mg/24hr patch Place 1 patch (14 mg total) onto the skin daily. 28 patch 0   ondansetron (ZOFRAN-ODT) 8 MG disintegrating tablet 8mg  ODT q4 hours prn nausea (Patient taking differently: Take 4 mg by mouth every 8 (eight) hours as needed for nausea or vomiting.) 10 tablet 0   oxyCODONE (OXY IR/ROXICODONE) 5 MG immediate release tablet Take 1 tablet (5 mg total) by mouth every 6 (six) hours as needed for severe pain. 10 tablet 0   piperacillin-tazobactam 2.25 g in sodium chloride 0.9 % 50 mL Inject 2.25 g into the vein every 8 (eight) hours. Administer at hemodialysis 27 g 0   traZODone (DESYREL) 150 MG tablet Take 150 mg by mouth at bedtime.     vitamin C (ASCORBIC ACID) 500 MG tablet Take 500 mg by mouth 2 (two) times daily.     zinc sulfate 220 (50 Zn) MG capsule Take 220 mg by mouth daily.     pantoprazole (PROTONIX) 40 MG tablet Take 1 tablet (40 mg total) by mouth daily. 30 tablet 1   No current facility-administered medications for this visit.    REVIEW OF SYSTEMS:  [X]  denotes positive finding, [ ]  denotes negative finding Cardiac  Comments:  Chest pain or chest pressure:    Shortness of breath upon exertion:    Short of breath when lying flat:    Irregular heart rhythm:        Vascular    Pain in calf, thigh, or hip brought on by ambulation:    Pain in feet at night  that wakes you up from your sleep:     Blood clot in your veins:    Leg swelling:         Pulmonary    Oxygen at home:    Productive cough:     Wheezing:  Neurologic    Sudden weakness in arms or legs:     Sudden numbness in arms or legs:     Sudden onset of difficulty speaking or slurred speech:    Temporary loss of vision in one eye:     Problems with dizziness:         Gastrointestinal    Blood in stool:     Vomited blood:         Genitourinary    Burning when urinating:     Blood in urine:        Psychiatric    Major depression:         Hematologic    Bleeding problems:    Problems with blood clotting too easily:        Skin    Rashes or ulcers:        Constitutional    Fever or chills:      PHYSICAL EXAM: Vitals:   10/29/21 1438  BP: (!) 198/82  Pulse: 75  Temp: (!) 97 F (36.1 C)  SpO2: 98%  Height: 4\' 11"  (1.499 m)    GENERAL: The patient is a well-nourished female, in no acute distress. The vital signs are documented above. CARDIOVASCULAR: Left upper arm AV loop graft.  She does have 1-2+ left radial pulse.  There is aneurysmal degeneration over the entire prior access area over the lateral aspect of her loop and an area on the most proximal portion of this with extreme thinning of the skin. PULMONARY: There is good air exchange  MUSCULOSKELETAL: There are no major deformities or cyanosis. NEUROLOGIC: No focal weakness or paresthesias are detected. SKIN: There are no ulcers or rashes noted. PSYCHIATRIC: The patient has a normal affect.  DATA:  None  MEDICAL ISSUES: I discussed options with the patient.  Concern regarding the severe thinning of skin over the aneurysmal portion of her graft.  I would recommend operative revision.  I explained that we would replace the lateral half of her loop graft.  This would allow her to continue the medial segment for 1 month and then could use the entire portion of the graft following this.  We will  coordinate this at [facility] for 11/04/2021 as an outpatient.  We will coordinate changing her dialysis day from Tuesday.   Rosetta Posner, MD FACS Vascular and Vein Specialists of [facility] [phone] Pager [phone]  Note: Portions of this report may have been transcribed using voice recognition software.  Every effort has been made to ensure accuracy; however, inadvertent computerized transcription errors may still be present.

## 2021-10-30 ENCOUNTER — Other Ambulatory Visit: Payer: Self-pay

## 2021-10-30 DIAGNOSIS — N186 End stage renal disease: Secondary | ICD-10-CM

## 2021-11-03 ENCOUNTER — Encounter (HOSPITAL_COMMUNITY)
Admission: RE | Admit: 2021-11-03 | Discharge: 2021-11-03 | Disposition: A | Discharge status: Home / Self Care | Payer: Medicare Other | Source: Ambulatory Visit | Attending: Vascular Surgery | Admitting: Vascular Surgery

## 2021-11-03 ENCOUNTER — Encounter (HOSPITAL_COMMUNITY): Payer: Self-pay

## 2021-11-03 ENCOUNTER — Other Ambulatory Visit: Payer: Self-pay

## 2021-11-03 NOTE — Pre-Procedure Instructions (Signed)
Spoke with Belinda @ [facility] to do pre-op phone call. She verbalizes understanding of all pre-op instructions. She also states that patient is able and cognitive to sign her own consent.

## 2021-11-03 NOTE — Pre-Procedure Instructions (Signed)
Left voicemail for Belinda @ [facility] to call us back about patients surgery for tomorrow.

## 2021-11-04 ENCOUNTER — Ambulatory Visit (HOSPITAL_COMMUNITY): Payer: Medicare Other | Admitting: Anesthesiology

## 2021-11-04 ENCOUNTER — Encounter (HOSPITAL_COMMUNITY)
Admission: RE | Disposition: A | Discharge status: Home / Self Care | Payer: Self-pay | Source: Home / Self Care | Attending: Vascular Surgery

## 2021-11-04 ENCOUNTER — Other Ambulatory Visit: Payer: Self-pay

## 2021-11-04 ENCOUNTER — Ambulatory Visit (HOSPITAL_COMMUNITY)
Admission: RE | Admit: 2021-11-04 | Discharge: 2021-11-04 | Disposition: A | Discharge status: Home / Self Care | Payer: Medicare Other | Attending: Vascular Surgery | Admitting: Vascular Surgery

## 2021-11-04 ENCOUNTER — Encounter (HOSPITAL_COMMUNITY): Payer: Self-pay | Admitting: Vascular Surgery

## 2021-11-04 ENCOUNTER — Ambulatory Visit (HOSPITAL_BASED_OUTPATIENT_CLINIC_OR_DEPARTMENT_OTHER): Payer: Medicare Other | Admitting: Anesthesiology

## 2021-11-04 DIAGNOSIS — N185 Chronic kidney disease, stage 5: Secondary | ICD-10-CM | POA: Diagnosis not present

## 2021-11-04 DIAGNOSIS — T82590A Other mechanical complication of surgically created arteriovenous fistula, initial encounter: Secondary | ICD-10-CM | POA: Diagnosis not present

## 2021-11-04 DIAGNOSIS — I132 Hypertensive heart and chronic kidney disease with heart failure and with stage 5 chronic kidney disease, or end stage renal disease: Secondary | ICD-10-CM

## 2021-11-04 DIAGNOSIS — N186 End stage renal disease: Secondary | ICD-10-CM | POA: Diagnosis present

## 2021-11-04 DIAGNOSIS — Z992 Dependence on renal dialysis: Secondary | ICD-10-CM | POA: Insufficient documentation

## 2021-11-04 DIAGNOSIS — I2510 Atherosclerotic heart disease of native coronary artery without angina pectoris: Secondary | ICD-10-CM | POA: Diagnosis not present

## 2021-11-04 DIAGNOSIS — F17220 Nicotine dependence, chewing tobacco, uncomplicated: Secondary | ICD-10-CM | POA: Insufficient documentation

## 2021-11-04 DIAGNOSIS — F32A Depression, unspecified: Secondary | ICD-10-CM | POA: Insufficient documentation

## 2021-11-04 DIAGNOSIS — I5032 Chronic diastolic (congestive) heart failure: Secondary | ICD-10-CM

## 2021-11-04 DIAGNOSIS — I252 Old myocardial infarction: Secondary | ICD-10-CM

## 2021-11-04 DIAGNOSIS — E1122 Type 2 diabetes mellitus with diabetic chronic kidney disease: Secondary | ICD-10-CM | POA: Insufficient documentation

## 2021-11-04 DIAGNOSIS — I509 Heart failure, unspecified: Secondary | ICD-10-CM | POA: Insufficient documentation

## 2021-11-04 DIAGNOSIS — K219 Gastro-esophageal reflux disease without esophagitis: Secondary | ICD-10-CM | POA: Diagnosis not present

## 2021-11-04 HISTORY — PX: REVISION OF ARTERIOVENOUS GORETEX GRAFT: SHX6073

## 2021-11-04 LAB — POCT I-STAT, CHEM 8
BUN: 27 mg/dL — ABNORMAL HIGH (ref 8–23)
Calcium, Ion: 1.04 mmol/L — ABNORMAL LOW (ref 1.15–1.40)
Chloride: 106 mmol/L (ref 98–111)
Creatinine, Ser: 2.7 mg/dL — ABNORMAL HIGH (ref 0.44–1.00)
Glucose, Bld: 129 mg/dL — ABNORMAL HIGH (ref 70–99)
HCT: 34 % — ABNORMAL LOW (ref 36.0–46.0)
Hemoglobin: 11.6 g/dL — ABNORMAL LOW (ref 12.0–15.0)
Potassium: 3.3 mmol/L — ABNORMAL LOW (ref 3.5–5.1)
Sodium: 143 mmol/L (ref 135–145)
TCO2: 28 mmol/L (ref 22–32)

## 2021-11-04 SURGERY — REVISION OF ARTERIOVENOUS GORETEX GRAFT
Anesthesia: General | Site: Arm Lower | Laterality: Left

## 2021-11-04 MED ORDER — ORAL CARE MOUTH RINSE: 15.0000 mL | Freq: Once | OROMUCOSAL | Status: AC

## 2021-11-04 MED ORDER — LIDOCAINE HCL (CARDIAC) PF 100 MG/5ML IV SOSY
PREFILLED_SYRINGE | INTRAVENOUS | Status: DC | PRN
  Administered 2021-11-04: 50 mg via INTRAVENOUS

## 2021-11-04 MED ORDER — CHLORHEXIDINE GLUCONATE 4 % EX LIQD: 60.0000 mL | Freq: Once | CUTANEOUS | Status: DC

## 2021-11-04 MED ORDER — LIDOCAINE-EPINEPHRINE 0.5 %-1:200000 IJ SOLN
INTRAMUSCULAR | Status: AC
  Filled 2021-11-04: qty 1

## 2021-11-04 MED ORDER — CHLORHEXIDINE GLUCONATE 0.12 % MT SOLN
OROMUCOSAL | Status: AC
  Filled 2021-11-04: qty 15

## 2021-11-04 MED ORDER — PHENYLEPHRINE HCL (PRESSORS) 10 MG/ML IV SOLN
INTRAVENOUS | Status: AC
  Filled 2021-11-04: qty 1

## 2021-11-04 MED ORDER — PHENYLEPHRINE HCL-NACL 20-0.9 MG/250ML-% IV SOLN
INTRAVENOUS | Status: DC | PRN
  Administered 2021-11-04: 50 ug/min via INTRAVENOUS

## 2021-11-04 MED ORDER — OXYCODONE-ACETAMINOPHEN 5-325 MG PO TABS: 1.0000 | ORAL_TABLET | Freq: Four times a day (QID) | ORAL | 0 refills | Status: DC | PRN

## 2021-11-04 MED ORDER — HEPARIN SODIUM (PORCINE) 1000 UNIT/ML IJ SOLN
INTRAMUSCULAR | Status: AC
  Filled 2021-11-04: qty 6

## 2021-11-04 MED ORDER — SODIUM CHLORIDE 0.9 % IV SOLN
INTRAVENOUS | Status: DC
  Administered 2021-11-04: 1000 mL via INTRAVENOUS

## 2021-11-04 MED ORDER — PROPOFOL 500 MG/50ML IV EMUL
INTRAVENOUS | Status: DC | PRN
  Administered 2021-11-04: 100 ug/kg/min via INTRAVENOUS

## 2021-11-04 MED ORDER — HEMOSTATIC AGENTS (NO CHARGE) OPTIME
TOPICAL | Status: DC | PRN
  Administered 2021-11-04: 1 via TOPICAL

## 2021-11-04 MED ORDER — HEPARIN 6000 UNIT IRRIGATION SOLUTION
Status: DC | PRN
  Administered 2021-11-04: 1

## 2021-11-04 MED ORDER — LIDOCAINE HCL (PF) 2 % IJ SOLN
INTRAMUSCULAR | Status: AC
  Filled 2021-11-04: qty 5

## 2021-11-04 MED ORDER — FENTANYL CITRATE (PF) 100 MCG/2ML IJ SOLN
INTRAMUSCULAR | Status: AC
  Filled 2021-11-04: qty 2

## 2021-11-04 MED ORDER — FENTANYL CITRATE (PF) 100 MCG/2ML IJ SOLN
INTRAMUSCULAR | Status: DC | PRN
  Administered 2021-11-04 (×2): 25 ug via INTRAVENOUS

## 2021-11-04 MED ORDER — CHLORHEXIDINE GLUCONATE 0.12 % MT SOLN
15.0000 mL | Freq: Once | OROMUCOSAL | Status: AC
  Administered 2021-11-04: 15 mL via OROMUCOSAL

## 2021-11-04 MED ORDER — CEFAZOLIN SODIUM-DEXTROSE 2-4 GM/100ML-% IV SOLN
2.0000 g | INTRAVENOUS | Status: AC
  Administered 2021-11-04: 2 g via INTRAVENOUS
  Filled 2021-11-04: qty 100

## 2021-11-04 MED ORDER — LACTATED RINGERS IV SOLN: INTRAVENOUS | Status: DC

## 2021-11-04 MED ORDER — SODIUM CHLORIDE 0.9 % IV SOLN: INTRAVENOUS | Status: DC

## 2021-11-04 MED ORDER — LIDOCAINE-EPINEPHRINE 0.5 %-1:200000 IJ SOLN
INTRAMUSCULAR | Status: DC | PRN
  Administered 2021-11-04: 9 mL

## 2021-11-04 MED ORDER — 0.9 % SODIUM CHLORIDE (POUR BTL) OPTIME
TOPICAL | Status: DC | PRN
  Administered 2021-11-04: 1000 mL

## 2021-11-04 SURGICAL SUPPLY — 35 items
ADH SKN CLS APL DERMABOND .7 (GAUZE/BANDAGES/DRESSINGS) ×1
BAG HAMPER (MISCELLANEOUS) ×1 IMPLANT
CANNULA VESSEL 3MM 2 BLNT TIP (CANNULA) ×1 IMPLANT
CLIP LIGATING EXTRA MED SLVR (CLIP) ×1 IMPLANT
CLIP LIGATING EXTRA SM BLUE (MISCELLANEOUS) ×1 IMPLANT
COVER LIGHT HANDLE STERIS (MISCELLANEOUS) ×2 IMPLANT
COVER MAYO STAND XLG (MISCELLANEOUS) IMPLANT
DECANTER SPIKE VIAL GLASS SM (MISCELLANEOUS) ×1 IMPLANT
DERMABOND ADVANCED .7 DNX12 (GAUZE/BANDAGES/DRESSINGS) ×1 IMPLANT
ELECT REM PT RETURN 9FT ADLT (ELECTROSURGICAL) ×1
ELECTRODE REM PT RTRN 9FT ADLT (ELECTROSURGICAL) ×1 IMPLANT
GAUZE SPONGE 4X4 12PLY STRL (GAUZE/BANDAGES/DRESSINGS) ×1 IMPLANT
GLOVE BIOGEL PI IND STRL 7.0 (GLOVE) ×2 IMPLANT
GLOVE SURG MICRO LTX SZ7.5 (GLOVE) ×1 IMPLANT
GOWN STRL REUS W/TWL LRG LVL3 (GOWN DISPOSABLE) ×5 IMPLANT
GRAFT GORETEX STRT 4-7X45 (Vascular Products) IMPLANT
HEMOSTAT SNOW SURGICEL 2X4 (HEMOSTASIS) IMPLANT
IV NS 500ML (IV SOLUTION) ×1
IV NS 500ML BAXH (IV SOLUTION) ×2 IMPLANT
KIT BLADEGUARD II DBL (SET/KITS/TRAYS/PACK) ×1 IMPLANT
KIT TURNOVER KIT A (KITS) ×1 IMPLANT
MANIFOLD NEPTUNE II (INSTRUMENTS) ×1 IMPLANT
MARKER SKIN DUAL TIP RULER LAB (MISCELLANEOUS) ×2 IMPLANT
NDL HYPO 25GX1X1/2 BEV (NEEDLE) ×1 IMPLANT
NEEDLE HYPO 25GX1X1/2 BEV (NEEDLE) ×1 IMPLANT
NS IRRIG 1000ML POUR BTL (IV SOLUTION) ×1 IMPLANT
PACK CV ACCESS (CUSTOM PROCEDURE TRAY) ×1 IMPLANT
PAD ARMBOARD 7.5X6 YLW CONV (MISCELLANEOUS) ×2 IMPLANT
SET BASIN LINEN APH (SET/KITS/TRAYS/PACK) ×1 IMPLANT
SPONGE T-LAP 18X18 ~~LOC~~+RFID (SPONGE) IMPLANT
STOCKINETTE 4X48 STRL (DRAPES) IMPLANT
SUT PROLENE 6 0 CC (SUTURE) ×2 IMPLANT
SUT VIC AB 3-0 SH 27 (SUTURE) ×2
SUT VIC AB 3-0 SH 27X BRD (SUTURE) ×2 IMPLANT
UNDERPAD 30X36 HEAVY ABSORB (UNDERPADS AND DIAPERS) ×1 IMPLANT

## 2021-11-04 NOTE — Interval H&P Note (Signed)
History and Physical Interval Note:  11/04/2021 11:57 AM  Erica [name]  has presented today for surgery, with the diagnosis of End Stage Renal Disease .  The various methods of treatment have been discussed with the patient and family. After consideration of risks, benefits and other options for treatment, the patient has consented to  Procedure(s): REVISION OF LEFT UPPER ARM ARTERIOVENOUS GORETEX GRAFT WITH REPLACEMENT OF LATERAL ARTERIAL HALF OF GRAFT (Left) as a surgical intervention.  The patient's history has been reviewed, patient examined, no change in status, stable for surgery.  I have reviewed the patient's chart and labs.  Questions were answered to the patient's satisfaction.     Curt Jews

## 2021-11-04 NOTE — Op Note (Signed)
    OPERATIVE REPORT  DATE OF SURGERY: 11/04/2021  PATIENT: Erica [name], 66 y.o. female MRN: [id number]  DOB: July 24, 1955  PRE-OPERATIVE DIAGNOSIS: End-stage renal disease with degeneration arterial limb of left upper arm AV Gore-Tex graft  POST-OPERATIVE DIAGNOSIS:  Same  PROCEDURE: Revision with replacement of left limb arterial limb upper arm graft  SURGEON:  Erica [name], M.D.  PHYSICIAN ASSISTANT: Erica Royals, RN, FA  The assistant was needed for exposure and to expedite the case  ANESTHESIA: Local with sedation  EBL: per anesthesia record  Total I/O In: 500 [I.V.:400; IV Piggyback:100] Out: -   BLOOD ADMINISTERED: none  DRAINS: none  SPECIMEN: none  COUNTS CORRECT:  YES  PATIENT DISPOSITION:  PACU - hemodynamically stable  PROCEDURE DETAILS: Patient was taken operating placed supine position where the area of the left arm strep draped you sterile fashion.  Patient had an upper arm loop graft.  The lateral arterial limb had degenerated and was aneurysmal with thinning skin.  Using local anesthesia incision was made at the apex of the loop graft and the graft was encircled with a vessel loop.  Second incision was made near the axilla over the arterial limb prior to the arterial anastomosis.  The graft was occluded proximally and distally in each of these incisions and the graft was transected.  The graft was flushed with heparinized saline and reoccluded.  A new tunnel was created lateral to the old arterial limb and a 7 mm graft was brought through the tunnel.  The graft was sewn into and to the old arterial limb near the axilla with a running 6-0 Prolene suture.  Anastomosis was tested and found to be adequate.  The graft was flushed with heparinized saline and reoccluded.  Next the graft was cut to the appropriate length and was sewn into in at the apex of the loop to the Gore-Tex graft again with end-to-end anastomosis with 6-0 Prolene suture.  Clamps were removed  and good flow was noted through the graft.  There was needle hole bleeding this was controlled with several additional 6-0 Prolene sutures were irrigated with saline.  The stasis obtained after cautery.  The wounds were closed with 3-0 Vicryl in the subcutaneous and subcuticular tissue.  Sterile dressing was applied and the patient was transferred to the recovery room in stable condition   Erica [name], M.D., [facility] 11/04/2021 3:04 PM  Note: Portions of this report may have been transcribed using voice recognition software.  Every effort has been made to ensure accuracy; however, inadvertent computerized transcription errors may still be present.

## 2021-11-04 NOTE — Transfer of Care (Signed)
Immediate Anesthesia Transfer of Care Note  Patient: Erica [name]  Procedure(s) Performed: REVISION OF LEFT UPPER ARM ARTERIOVENOUS GORETEX GRAFT WITH REPLACEMENT OF LATERAL ARTERIAL HALF OF GRAFT (Left: Arm Lower)  Patient Location: PACU and Short Stay  Anesthesia Type:General  Level of Consciousness: awake, alert  and patient cooperative  Airway & Oxygen Therapy: Patient Spontanous Breathing  Post-op Assessment: Report given to RN, Post -op Vital signs reviewed and stable and Patient moving all extremities X 4  Post vital signs: Reviewed and stable  Last Vitals:  Vitals Value Taken Time  BP 115/48   Temp    Pulse 69   Resp 20   SpO2 18     Last Pain:  Vitals:   11/04/21 1116  TempSrc: Oral  PainSc: 0-No pain      Patients Stated Pain Goal: 4 (56/15/37 9432)  Complications: No notable events documented.

## 2021-11-04 NOTE — Discharge Instructions (Signed)
[facility]  Discharge Instructions  AV Fistula or Graft Surgery for Dialysis Access  Please refer to the following instructions for your post-procedure care. Your surgeon or physician assistant will discuss any changes with you.  Activity  You may drive the day following your surgery, if you are comfortable and no longer taking prescription pain medication. Resume full activity as the soreness in your incision resolves.  Bathing/Showering  You may shower after you go home. Keep your incision dry for 48 hours. Do not soak in a bathtub, hot tub, or swim until the incision heals completely. You may not shower if you have a hemodialysis catheter.  Incision Care  Clean your incision with mild soap and water after 48 hours. Pat the area dry with a clean towel. You do not need a bandage unless otherwise instructed. Do not apply any ointments or creams to your incision. You may have skin glue on your incision. Do not peel it off. It will come off on its own in about one week. Your arm may swell a bit after surgery. To reduce swelling use pillows to elevate your arm so it is above your heart. Your doctor will tell you if you need to lightly wrap your arm with an ACE bandage.  Diet  Resume your normal diet. There are not special food restrictions following this procedure. In order to heal from your surgery, it is CRITICAL to get adequate nutrition. Your body requires vitamins, minerals, and protein. Vegetables are the best source of vitamins and minerals. Vegetables also provide the perfect balance of protein. Processed food has little nutritional value, so try to avoid this.  Medications  Resume taking all of your medications. If your incision is causing pain, you may take over-the counter pain relievers such as acetaminophen (Tylenol). If you were prescribed a stronger pain medication, please be aware these medications can cause nausea and constipation. Prevent  nausea by taking the medication with a snack or meal. Avoid constipation by drinking plenty of fluids and eating foods with high amount of fiber, such as fruits, vegetables, and grains.  Do not take Tylenol if you are taking prescription pain medications.  Follow up Your surgeon may want to see you in the office following your access surgery. If so, this will be arranged at the time of your surgery.  Please call us immediately for any of the following conditions:  Increased pain, redness, drainage (pus) from your incision site Fever of 101 degrees or higher Severe or worsening pain at your incision site Hand pain or numbness.  Reduce your risk of vascular disease:  Stop smoking. If you would like help, call [facility] at 1-800-QUIT-NOW (779)716-7668) or [place] [facility] [place] your cholesterol Maintain a desired weight Control your diabetes Keep your blood pressure down  Dialysis  It will take several weeks to several months for your new dialysis access to be ready for use. Your surgeon will determine when it is okay to use it. Your nephrologist will continue to direct your dialysis. You can continue to use your Permcath until your new access is ready for use.   11/04/2021 Erica [name] [id number] 12-24-1955  Surgeon(s): Adelayde Minney, Arvilla Meres, MD  Procedure(s): REVISION OF LEFT UPPER ARM ARTERIOVENOUS GORETEX GRAFT WITH REPLACEMENT OF LATERAL ARTERIAL HALF OF GRAFT   May stick graft immediately   May stick graft on designated area only:    Do not stick graft for 4 weeks    If  you have any questions, please call the office at [phone].

## 2021-11-04 NOTE — Anesthesia Preprocedure Evaluation (Signed)
Anesthesia Evaluation  Patient identified by MRN, date of birth, ID band Patient awake    Reviewed: Allergy & Precautions, H&P , NPO status , Patient's Chart, lab work & pertinent test results, reviewed documented beta blocker date and time   Airway Mallampati: II  TM Distance: >3 FB Neck ROM: full    Dental no notable dental hx. (+) Missing, Poor Dentition   Pulmonary neg pulmonary ROS,    Pulmonary exam normal breath sounds clear to auscultation       Cardiovascular Exercise Tolerance: Good hypertension, + CAD, + Past MI and +CHF   Rhythm:regular Rate:Normal     Neuro/Psych Seizures -,  PSYCHIATRIC DISORDERS Depression    GI/Hepatic Neg liver ROS, GERD  Medicated,  Endo/Other  negative endocrine ROSdiabetes, Type 2  Renal/GU ESRF and DialysisRenal disease  negative genitourinary   Musculoskeletal   Abdominal   Peds  Hematology  (+) Blood dyscrasia, anemia ,   Anesthesia Other Findings Left ventricle: The cavity size was normal. Moderate focal basal  septal hypertrophy. Mild hypertrophy of the rest of the  ventricle. No LV outflow tract gradient noted. Systolic function  was normal. The estimated ejection fraction was in the range of  60% to 65%. Wall motion was normal; there were no regional wall  motion abnormalities. Doppler parameters are consistent with  abnormal left ventricular relaxation (grade 1 diastolic  dysfunction).  - Aortic valve: There was no stenosis. There was trivial  regurgitation.  - Mitral valve: There was chordal systolic anterior motion but not  valvular SAM. Moderately calcified annulus. There was trivial  regurgitation.  - Right ventricle: The cavity size was normal. Systolic function  was normal.  - Right atrium: Catheter noted in RA.  - Tricuspid valve: Peak RV-RA gradient (S): 47 mm Hg.  - Pulmonary arteries: PA peak pressure: 50 mm Hg (S).  - Inferior vena  cava: The vessel was normal in size. The  respirophasic diameter changes were in the normal range (>= 50%),  consistent with normal central venous pressure.   Impressions:   - Normal LV size with moderate asymmetric basal septal hypertrophy,  mild hypertrophy of the rest of the ventricle. No LVOT gradient.  There was chordal but not mitral valve SAM. Normal RV size and  systolic function. No significant valvular abnormalities.  Differential is LVH due to HTN versus a variant of hypertrophic  cardiomyopathy.   Reproductive/Obstetrics negative OB ROS                             Anesthesia Physical Anesthesia Plan  ASA: 4  Anesthesia Plan: General   Post-op Pain Management:    Induction:   PONV Risk Score and Plan: Ondansetron  Airway Management Planned:   Additional Equipment:   Intra-op Plan:   Post-operative Plan:   Informed Consent: I have reviewed the patients History and Physical, chart, labs and discussed the procedure including the risks, benefits and alternatives for the proposed anesthesia with the patient or authorized representative who has indicated his/her understanding and acceptance.     Dental Advisory Given  Plan Discussed with: CRNA  Anesthesia Plan Comments:         Anesthesia Quick Evaluation

## 2021-11-05 ENCOUNTER — Telehealth: Payer: Self-pay | Admitting: Vascular Surgery

## 2021-11-05 NOTE — Anesthesia Postprocedure Evaluation (Signed)
Anesthesia Post Note  Patient: Erica [name]  Procedure(s) Performed: REVISION OF LEFT UPPER ARM ARTERIOVENOUS GORETEX GRAFT WITH REPLACEMENT OF LATERAL ARTERIAL HALF OF GRAFT (Left: Arm Lower)  Patient location during evaluation: Phase II Anesthesia Type: General Level of consciousness: awake Pain management: pain level controlled Vital Signs Assessment: post-procedure vital signs reviewed and stable Respiratory status: spontaneous breathing and respiratory function stable Cardiovascular status: blood pressure returned to baseline and stable Postop Assessment: no headache and no apparent nausea or vomiting Anesthetic complications: no Comments: Late entry   No notable events documented.   Last Vitals:  Vitals:   11/04/21 1116 11/04/21 1515  BP: 138/61 (!) 117/58  Pulse: 76 70  Resp: 12 18  Temp: 36.7 C (!) 36.4 C  SpO2: 99% 97%    Last Pain:  Vitals:   11/04/21 1515  TempSrc: Oral  PainSc: 0-No pain                 Louann Sjogren

## 2021-11-05 NOTE — Telephone Encounter (Signed)
-----   Message from Rosetta Posner, MD sent at 11/04/2021  3:04 PM EDT -----  Revision of left upper arm AV Gore-Tex graft with replacement of arterial limb.  Tourist information centre manager.  I need to see her in the office in 1 month.  Does not need duplex

## 2021-11-09 NOTE — Progress Notes (Signed)
GI Office Note    Referring Provider: No ref. provider found Primary Care Physician:  System, Provider Not In  Primary Gastroenterologist: Elon Alas. Abbey Chatters, DO   Chief Complaint   Chief Complaint  Patient presents with   Colonoscopy    No longer having diarrhea    History of Present Illness   Erica [name] is a 66 y.o. female presenting today for hospital follow up. Seen last month when she presented with N/V, abd pain, and diarrhea. CT without contrast showed proctocolitis, questionable ascending colitis, possible gastritis.  Hemoglobin on admission 9.3, previously 11.2 several weeks ago. Stools also heme positive. Last colonoscopy in 2012 with fair prep and 2 tubular adenomas removed. She was treated empirically with antibiotics. Plan fo EGD/colonoscopy in 6-8 weeks. She also complained of solid food dysphagia. She reported 7 pound weight loss, poor appetite.   She was noted to have chronic mild elevation of alk phos. GGT high at 91. Immune to Hep B. Negative for Hep C. No iron overload. IgM and AMA negative. CT with no focal liver abnormality and no biliary ductal dilation. Findings c/w chronic pancreatitis. Noncontrast study.   Her GI path panel and Cdiff were all negative.   Today:  Diarrhea better but still having two loose stools at nighttime. No melean, brbpr reported. Some lower abdominal pain. Not a fan of the food provided. Appetite poor. Nausea every morning associated with head spinning and headache. Lasts for few hours. No heartburn. Pills want to stop in chest with swallowing. Sometimes with food too.     Medications   Current Outpatient Medications  Medication Sig Dispense Refill   acetaminophen (TYLENOL) 325 MG tablet Take 2 tablets (650 mg total) by mouth every 6 (six) hours as needed for mild pain (or Fever >/= 101). 30 tablet 0   albuterol (VENTOLIN HFA) 108 (90 Base) MCG/ACT inhaler Inhale 2 puffs into the lungs every 4 (four) hours as needed for  wheezing or shortness of breath.      Biotin 10 MG TABS Take 10 mg by mouth daily at 12 noon.     calcitRIOL (ROCALTROL) 0.25 MCG capsule Take 1 capsule (0.25 mcg total) by mouth Every Tuesday,Thursday,and Saturday with dialysis.     carvedilol (COREG) 25 MG tablet Take 1 tablet (25 mg total) by mouth 2 (two) times daily. 60 tablet 0   ferric citrate (AURYXIA) 1 GM 210 MG(Fe) tablet Take 2 tablets (420 mg total) by mouth 3 (three) times daily with meals. 162 tablet    folic acid (FOLVITE) 1 MG tablet Take 1 mg by mouth daily at 12 noon.     hydrALAZINE (APRESOLINE) 50 MG tablet Take 1 tablet (50 mg total) by mouth every 8 (eight) hours. 90 tablet 0   Infant Care Products (JOHNSONS BABY SHAMPOO) SHAM Place 1 Application into the right eye in the morning and at bedtime. For 2 days (9/26-9/27)     insulin aspart (NOVOLOG) 100 UNIT/ML injection Inject 1-3 Units into the skin See admin instructions. Inject per sliding scale  If BS 151-250= 1 unit 251- 350= 2 units 351-400= 3 units 401-500= 3 units Give subcutaneous at bedtime     levETIRAcetam (KEPPRA) 500 MG tablet Take 1 tablet (500 mg total) by mouth at bedtime. 30 tablet 0   lidocaine-prilocaine (EMLA) cream Apply 1 application. topically as directed. Apply to left arm fistula every tues, thurs, and sat for hemodialysis     mirtazapine (REMERON) 15 MG tablet Take 15  mg by mouth at bedtime.     Multiple Vitamin (MULTIVITAMIN ADULT PO) Take 1 tablet by mouth daily at 12 noon.     oxyCODONE-acetaminophen (PERCOCET) 5-325 MG tablet Take 1 tablet by mouth every 6 (six) hours as needed for severe pain. 8 tablet 0   pantoprazole (PROTONIX) 40 MG tablet Take 1 tablet (40 mg total) by mouth daily. (Patient taking differently: Take 40 mg by mouth daily at 12 noon.) 30 tablet 1   traZODone (DESYREL) 150 MG tablet Take 150 mg by mouth at bedtime.     vitamin C (ASCORBIC ACID) 500 MG tablet Take 500 mg by mouth 2 (two) times daily.     zinc sulfate 220 (50  Zn) MG capsule Take 220 mg by mouth daily at 12 noon.     Darbepoetin Alfa (ARANESP) 200 MCG/0.4ML SOSY injection Inject 0.4 mLs (200 mcg total) into the vein every Saturday with hemodialysis. 1.68 mL 0   ofloxacin (OCUFLOX) 0.3 % ophthalmic solution Place 1 drop into the right eye 4 (four) times daily.     No current facility-administered medications for this visit.    Allergies   Allergies as of 11/10/2021 - Review Complete 11/04/2021  Allergen Reaction Noted   Aspirin Palpitations and Other (See Comments) 10/29/2010   Metformin and related  01/12/2021     Past Medical History   Past Medical History:  Diagnosis Date   Alcohol-induced pancreatitis    CAD (coronary artery disease) 09/17/2020   Chronic diarrhea    Depression    Diabetes mellitus    fasting blood sugar 110-120s   Dialysis patient ([place])    Diastolic CHF ([place])    DKA (diabetic ketoacidoses)    Gastroparesis    GERD (gastroesophageal reflux disease)    Heart murmur    History of kidney stones    Hyperlipidemia    Hypertension    Hypokalemia    Muscle spasm    Neuropathic pain    Neuropathy    Hx: of   Pyelonephritis    Seizures ([place])    Vitamin B12 deficiency    Vitamin D deficiency     Past Surgical History   Past Surgical History:  Procedure Laterality Date   AV FISTULA PLACEMENT Left 12/15/2017   Procedure: INSERTION OF ARTERIOVENOUS (AV) GORE-TEX GRAFT ARM;  Surgeon: Rosetta Posner, MD;  Location: [place];  Service: Vascular;  Laterality: Left;   CATARACT EXTRACTION W/PHACO Right 03/14/2015   Procedure: CATARACT EXTRACTION PHACO AND INTRAOCULAR LENS PLACEMENT ([place]);  Surgeon: Baruch Goldmann, MD;  Location: [facility];  Service: Ophthalmology;  Laterality: Right;  CDE:11.13   CATARACT EXTRACTION W/PHACO Left 04/11/2015   Procedure: CATARACT EXTRACTION PHACO AND INTRAOCULAR LENS PLACEMENT LEFT EYE CDE=9.68;  Surgeon: Baruch Goldmann, MD;  Location: [facility];  Service: Ophthalmology;  Laterality: Left;    COLONOSCOPY  02/24/2010   fair prep, limited exam. Two small polyps removed. Tubular adenoma   cystoscopy with ureteral stent  Bilateral 10/07/2017   At [place] CV LINE RIGHT  11/17/2017   IR FLUORO GUIDE CV LINE RIGHT  11/22/2017   IR REMOVAL TUN CV CATH W/O FL  01/26/2018   IR US GUIDE VASC ACCESS RIGHT  11/17/2017   MULTIPLE EXTRACTIONS WITH ALVEOLOPLASTY N/A 06/13/2012   Procedure: MULTIPLE EXTRACION WITH ALVEOLOPLASTY EXTRACT: 18, 19, 20, 21, 22, 24, 25, 27, 28, 29, 30, 31;  Surgeon: Gae Bon, DDS;  Location: [facility];  Service: Oral Surgery;  Laterality: N/A;   ORIF HUMERUS FRACTURE Right 04/20/2019   Procedure: OPEN REDUCTION INTERNAL FIXATION (ORIF) PROXIMAL HUMERUS FRACTURE;  Surgeon: Marchia Bond, MD;  Location: [place];  Service: Orthopedics;  Laterality: Right;   TUBAL LIGATION     URETERAL STENT PLACEMENT  09/2017    Past Family History   Family History  Problem Relation Age of Onset   Diabetes Sister    Chronic Renal Failure Neg Hx    Colon cancer Neg Hx    Colon polyps Neg Hx     Past Social History   Social History   Socioeconomic History   Marital status: Legally Separated    Spouse name: Not on file   Number of children: Not on file   Years of education: Not on file   Highest education level: Not on file  Occupational History   Not on file  Tobacco Use   Smoking status: Never   Smokeless tobacco: Current    Types: Snuff  Vaping Use   Vaping Use: Never used  Substance and Sexual Activity   Alcohol use: Not Currently    Alcohol/week: 1.0 standard drink of alcohol    Types: 1 Cans of beer per week   Drug use: No   Sexual activity: Not Currently  Other Topics Concern   Not on file  Social History Narrative   Not on file   Social Determinants of Health   Financial Resource Strain: Not on file  Food Insecurity: Not on file  Transportation Needs: Not on file  Physical Activity: Not on file  Stress: Not on file   Social Connections: Not on file  Intimate Partner Violence: Not on file    Review of Systems   General: Negative for anorexia, fever, chills, fatigue, weakness. ENT: Negative for hoarseness, difficulty swallowing , nasal congestion. CV: Negative for chest pain, angina, palpitations, dyspnea on exertion, peripheral edema.  Respiratory: Negative for dyspnea at rest, dyspnea on exertion, cough, sputum, wheezing.  GI: See history of present illness. GU:  Negative for dysuria, hematuria, urinary incontinence, urinary frequency, nocturnal urination.  Endo: Negative for unusual weight change.     Physical Exam   BP (!) 189/74 (BP Location: Right Arm, Patient Position: Sitting, Cuff Size: Normal)   Pulse 73   Temp (!) 97.5 F (36.4 C) (Temporal)   Ht _0  (1.422 m)   Wt 99 lb (44.9 kg)   SpO2 96%   BMI 22.20 kg/m    General: elderly female in NAD, appears older than stated age.   Eyes: No icterus. Mouth: Oropharyngeal mucosa moist and pink , no lesions erythema or exudate. Lungs: Clear to auscultation bilaterally.  Heart: Regular rate and rhythm, no murmurs rubs or gallops.  Abdomen: Bowel sounds are normal, nontender, nondistended, no hepatosplenomegaly or masses,  no abdominal bruits or hernia , no rebound or guarding.  Rectal: not performed  Extremities: No lower extremity edema. No clubbing or deformities. Neuro: Alert and oriented x 4   Skin: Warm and dry, no jaundice.   Psych: Alert and cooperative, normal mood and affect.  Labs   Lab Results  Component Value Date   CREATININE 2.70 (H) 11/04/2021   BUN 27 (H) 11/04/2021   NA 143 11/04/2021   K 3.3 (L) 11/04/2021   CL 106 11/04/2021   CO2 28 10/19/2021   Lab Results  Component Value Date   ALT 28 10/19/2021   AST 40 10/19/2021   GGT 91 (H) 10/14/2021   ALKPHOS 159 (H)  10/19/2021   BILITOT 0.7 10/19/2021   Lab Results  Component Value Date   WBC 4.0 10/19/2021   HGB 11.6 (L) 11/04/2021   HCT 34.0 (L)  11/04/2021   MCV 105.7 (H) 10/19/2021   PLT 80 (L) 10/19/2021   Lab Results  Component Value Date   IRON 39 10/13/2021   TIBC 131 (L) 10/13/2021   FERRITIN 1,582 (H) 10/13/2021   Lab Results  Component Value Date   VITAMINB12 1,598 (H) 10/13/2021   Lab Results  Component Value Date   FOLATE >40.0 10/13/2021   Lab Results  Component Value Date   HGBA1C 5.5 10/16/2021    Imaging Studies   DG Chest Portable 1 View  Result Date: 10/12/2021 CLINICAL DATA:  Nausea and vomiting. EXAM: PORTABLE CHEST 1 VIEW COMPARISON:  August 24, 2021 FINDINGS: The cardiac silhouette is borderline in size. There is no evidence of acute infiltrate, pleural effusion or pneumothorax. The visualized skeletal structures are unremarkable. IMPRESSION: No active cardiopulmonary disease. Electronically Signed   By: Virgina Norfolk M.D.   On: 10/12/2021 19:04   CT ABDOMEN PELVIS WO CONTRAST  Result Date: 10/12/2021 CLINICAL DATA:  Nausea and vomiting. EXAM: CT ABDOMEN AND PELVIS WITHOUT CONTRAST TECHNIQUE: Multidetector CT imaging of the abdomen and pelvis was performed following the standard protocol without IV contrast. RADIATION DOSE REDUCTION: This exam was performed according to the departmental dose-optimization program which includes automated exposure control, adjustment of the mA and/or kV according to patient size and/or use of iterative reconstruction technique. COMPARISON:  CT abdomen and pelvis 08/15/2020. FINDINGS: Lower chest: No acute abnormality.  Heart is enlarged. Hepatobiliary: No focal liver abnormality is seen. Status post cholecystectomy. No biliary dilatation. Pancreas: Pancreas is diffusely atrophic with coarse calcifications compatible with chronic pancreatitis, unchanged. No acute inflammatory changes. Spleen: Normal in size without focal abnormality. Adrenals/Urinary Tract: There is some punctate nonobstructing bilateral renal calculi. Prominent renal vascular calcifications are again seen.  There is no hydronephrosis. No ureteral calculi are seen. The adrenal glands are within normal limits. The bladder is under distended and not well evaluated. Stomach/Bowel: There is rectosigmoid wall thickening with mild surrounding inflammation. No dilated bowel loops, pneumatosis or free air. There is questionable wall thickening of the ascending colon. Small bowel is within normal limits. The stomach is decompressed. Gastric wall thickening not excluded. Vascular/Lymphatic: No significant vascular findings are present. No enlarged abdominal or pelvic lymph nodes. Extensive peripheral vascular calcifications are present. Reproductive: Uterus and bilateral adnexa are unremarkable. Other: Small volume ascites.  Body wall edema.  No focal hernia. Musculoskeletal: The bones are osteopenic. Chronic compression deformities of T10, T12 and L2 appear similar to the prior study. Right hip arthroplasty is similar to the prior study. IMPRESSION: 1. Rectosigmoid wall thickening and inflammation compatible with proctocolitis. 2. Questionable ascending colitis. 3. Questionable gastritis. 4. Small volume ascites and body wall edema. 5. Nonobstructing bilateral renal calculi. 6.  Aortic Atherosclerosis (ICD10-I70.0). Electronically Signed   By: Ronney Asters M.D.   On: 10/12/2021 19:03   CT Head Wo Contrast  Result Date: 10/12/2021 CLINICAL DATA:  Mental status change EXAM: CT HEAD WITHOUT CONTRAST TECHNIQUE: Contiguous axial images were obtained from the base of the skull through the vertex without intravenous contrast. RADIATION DOSE REDUCTION: This exam was performed according to the departmental dose-optimization program which includes automated exposure control, adjustment of the mA and/or kV according to patient size and/or use of iterative reconstruction technique. COMPARISON:  Head CT dated August 21, 2021 FINDINGS: Brain: No  evidence of acute infarction, hemorrhage, hydrocephalus, extra-axial collection or mass  lesion/mass effect. Vascular: No hyperdense vessel or unexpected calcification. Skull: Normal. Negative for fracture or focal lesion. Sinuses/Orbits: No acute finding. Other: None. IMPRESSION: No acute intracranial abnormality. Electronically Signed   By: Yetta Glassman M.D.   On: 10/12/2021 18:57    Assessment   Diarrhea: Improved since admission but still having a couple loose stools per day.  CT with questionable ascending colitis.  Noted rectosigmoid wall thickening and inflammation.  Stool studies were negative.  Possibly superimposed infectious process accounting for CT findings.  Cannot exclude IBD.  May have an element of EPI from chronic pancreatitis.  Dysphagia: Reports solid food/pill dysphagia.  No prior upper endoscopy.  Poor appetite/nausea: Previously reported 7 pound unintentional weight loss.  Does not like food provided to her usually.  Likely multifactorial.  Anemia: Likely multifactorial in the setting of end-stage renal disease.  No evidence of B12, folate deficiency during recent hospitalization.  Serum iron normal.  Ferritin elevated.  History of tubular adenomas removed from the colon in the past, overdue for surveillance.  Isolated elevated alkaline phosphatase: Mildly elevated GGT of 91.  Immune to hepatitis B.  IgM and mitochondrial antibodies negative.  CT with no focal liver abnormality, no biliary ductal dilatation.  Query related to renal disease. Given alkphos<50% elevated, would monitoring alk phos for now.  PLAN   Colonoscopy/upper endoscopy with possible esophageal dilation. ASA 3.  I have discussed the risks, alternatives, benefits with regards to but not limited to the risk of reaction to medication, bleeding, infection, perforation and the patient is agreeable to proceed. Written consent to be obtained.    Laureen Ochs. Bobby Rumpf, [place], [facility]

## 2021-11-10 ENCOUNTER — Ambulatory Visit (INDEPENDENT_AMBULATORY_CARE_PROVIDER_SITE_OTHER): Payer: Medicare Other | Admitting: Gastroenterology

## 2021-11-10 ENCOUNTER — Encounter: Payer: Self-pay | Admitting: Gastroenterology

## 2021-11-10 VITALS — BP 179/72 | HR 73 | Temp 97.5°F | Ht <= 58 in | Wt 99.0 lb

## 2021-11-10 DIAGNOSIS — K529 Noninfective gastroenteritis and colitis, unspecified: Secondary | ICD-10-CM

## 2021-11-10 DIAGNOSIS — R630 Anorexia: Secondary | ICD-10-CM | POA: Diagnosis not present

## 2021-11-10 DIAGNOSIS — Z86010 Personal history of colonic polyps: Secondary | ICD-10-CM | POA: Diagnosis not present

## 2021-11-10 DIAGNOSIS — R1319 Other dysphagia: Secondary | ICD-10-CM | POA: Diagnosis not present

## 2021-11-10 DIAGNOSIS — Z860101 Personal history of adenomatous and serrated colon polyps: Secondary | ICD-10-CM | POA: Insufficient documentation

## 2021-11-10 NOTE — Patient Instructions (Signed)
Colonoscopy and upper endoscopy to be scheduled. See separate instructions.  

## 2021-11-11 ENCOUNTER — Encounter: Payer: Self-pay | Admitting: *Deleted

## 2021-11-11 ENCOUNTER — Encounter (HOSPITAL_COMMUNITY): Payer: Self-pay | Admitting: Vascular Surgery

## 2021-11-11 ENCOUNTER — Telehealth: Payer: Self-pay | Admitting: *Deleted

## 2021-11-11 MED ORDER — PEG 3350-KCL-NA BICARB-NACL 420 G PO SOLR: 4000.0000 mL | Freq: Once | ORAL | 0 refills | Status: AC

## 2021-11-11 NOTE — Telephone Encounter (Signed)
Pt's appointment, instructions and pre-op appt were faxed to Christene Slates at [facility]

## 2021-12-03 ENCOUNTER — Encounter: Payer: Self-pay | Admitting: Vascular Surgery

## 2021-12-03 ENCOUNTER — Ambulatory Visit (INDEPENDENT_AMBULATORY_CARE_PROVIDER_SITE_OTHER): Payer: Medicare Other | Admitting: Vascular Surgery

## 2021-12-03 VITALS — BP 206/84 | HR 74 | Temp 97.7°F | Resp 98 | Ht <= 58 in

## 2021-12-03 DIAGNOSIS — N186 End stage renal disease: Secondary | ICD-10-CM

## 2021-12-03 DIAGNOSIS — Z992 Dependence on renal dialysis: Secondary | ICD-10-CM

## 2021-12-03 NOTE — Progress Notes (Signed)
[facility] [place]  Patient name: Erica [name] MRN: [id number] DOB: [birth year] Sex: female  REASON FOR VISIT: Follow-up revision left upper arm AV Gore-Tex graft on 11/04/2021  HPI: Erica [name] is a 66 y.o. female here today for follow-up.  She had a left upper arm loop graft placed several years ago by myself.  She had developed degeneration of the lateral arterial half of her loop.  She was taken the operating room on 11/04/2021 and had replacement of the lateral one half of her loop.  She is here today for follow-up.  She continues to have use of the medial venous half of her loop successfully.  She has dialysis at [facility].  Current Outpatient Medications  Medication Sig Dispense Refill   acetaminophen (TYLENOL) 325 MG tablet Take 2 tablets (650 mg total) by mouth every 6 (six) hours as needed for mild pain (or Fever >/= 101). 30 tablet 0   albuterol (VENTOLIN HFA) 108 (90 Base) MCG/ACT inhaler Inhale 2 puffs into the lungs every 4 (four) hours as needed for wheezing or shortness of breath.      Biotin 10 MG TABS Take 10 mg by mouth daily at 12 noon.     calcitRIOL (ROCALTROL) 0.25 MCG capsule Take 1 capsule (0.25 mcg total) by mouth Every Tuesday,Thursday,and Saturday with dialysis.     carvedilol (COREG) 25 MG tablet Take 1 tablet (25 mg total) by mouth 2 (two) times daily. 60 tablet 0   Darbepoetin Alfa (ARANESP) 200 MCG/0.4ML SOSY injection Inject 0.4 mLs (200 mcg total) into the vein every Saturday with hemodialysis. 1.68 mL 0   ferric citrate (AURYXIA) 1 GM 210 MG(Fe) tablet Take 2 tablets (420 mg total) by mouth 3 (three) times daily with meals. 219 tablet    folic acid (FOLVITE) 1 MG tablet Take 1 mg by mouth daily at 12 noon.     hydrALAZINE (APRESOLINE) 50 MG tablet Take 1 tablet (50 mg total) by mouth every 8 (eight) hours. 90 tablet 0   Infant Care Products (JOHNSONS BABY SHAMPOO) SHAM Place 1 Application  into the right eye in the morning and at bedtime. For 2 days (9/26-9/27)     insulin aspart (NOVOLOG) 100 UNIT/ML injection Inject 1-3 Units into the skin See admin instructions. Inject per sliding scale  If BS 151-250= 1 unit 251- 350= 2 units 351-400= 3 units 401-500= 3 units Give subcutaneous at bedtime     levETIRAcetam (KEPPRA) 500 MG tablet Take 1 tablet (500 mg total) by mouth at bedtime. 30 tablet 0   lidocaine-prilocaine (EMLA) cream Apply 1 application. topically as directed. Apply to left arm fistula every tues, thurs, and sat for hemodialysis     mirtazapine (REMERON) 15 MG tablet Take 15 mg by mouth at bedtime.     Multiple Vitamin (MULTIVITAMIN ADULT PO) Take 1 tablet by mouth daily at 12 noon.     ofloxacin (OCUFLOX) 0.3 % ophthalmic solution Place 1 drop into the right eye 4 (four) times daily.     oxyCODONE-acetaminophen (PERCOCET) 5-325 MG tablet Take 1 tablet by mouth every 6 (six) hours as needed for severe pain. 8 tablet 0   pantoprazole (PROTONIX) 40 MG tablet Take 1 tablet (40 mg total) by mouth daily. (Patient taking differently: Take 40 mg by mouth daily at 12 noon.) 30 tablet 1   vitamin C (ASCORBIC ACID) 500 MG tablet Take 500 mg by mouth 2 (two) times daily.     zinc sulfate  220 (50 Zn) MG capsule Take 220 mg by mouth daily at 12 noon.     traZODone (DESYREL) 150 MG tablet Take 150 mg by mouth at bedtime. (Patient not taking: Reported on 12/03/2021)     No current facility-administered medications for this visit.     PHYSICAL EXAM: Vitals:   12/03/21 0933  BP: (!) 206/84  Pulse: 74  Resp: (!) 98  Temp: 97.7 F (36.5 C)  Height: 4\' 8"  (1.422 m)    GENERAL: The patient is a well-nourished female, in no acute distress. The vital signs are documented above. Excellent healing of her graft.  Excellent thrill and bruit in her graft.  MEDICAL ISSUES: Stable overall.  She is safe to begin using her entire graft.  The more lateral aspect is her new arterial  segment and this can be accessed at any time   Rosetta Posner, MD FACS [facility] [place] Office Tel [phone]  Note: Portions of this report may have been transcribed using voice recognition software.  Every effort has been made to ensure accuracy; however, inadvertent computerized transcription errors may still be present.

## 2021-12-11 ENCOUNTER — Encounter (HOSPITAL_COMMUNITY): Payer: Self-pay

## 2021-12-11 ENCOUNTER — Encounter (HOSPITAL_COMMUNITY)
Admission: RE | Admit: 2021-12-11 | Discharge: 2021-12-11 | Disposition: A | Discharge status: Home / Self Care | Payer: Medicare Other | Source: Ambulatory Visit | Attending: Internal Medicine | Admitting: Internal Medicine

## 2021-12-11 VITALS — Wt 98.6 lb

## 2021-12-11 DIAGNOSIS — D649 Anemia, unspecified: Secondary | ICD-10-CM

## 2021-12-11 DIAGNOSIS — E1122 Type 2 diabetes mellitus with diabetic chronic kidney disease: Secondary | ICD-10-CM

## 2021-12-11 HISTORY — DX: Chronic kidney disease, unspecified: N18.9

## 2021-12-14 ENCOUNTER — Encounter (HOSPITAL_COMMUNITY): Payer: Self-pay | Admitting: Anesthesiology

## 2021-12-15 ENCOUNTER — Encounter (HOSPITAL_COMMUNITY): Payer: Self-pay

## 2021-12-15 ENCOUNTER — Emergency Department (HOSPITAL_COMMUNITY): Payer: Medicare Other

## 2021-12-15 ENCOUNTER — Ambulatory Visit (HOSPITAL_COMMUNITY)
Admission: RE | Admit: 2021-12-15 | Discharge: 2021-12-15 | Disposition: A | Discharge status: Skilled Nursing Facility | Payer: Medicare Other | Source: Home / Self Care | Attending: Internal Medicine | Admitting: Internal Medicine

## 2021-12-15 ENCOUNTER — Other Ambulatory Visit: Payer: Self-pay

## 2021-12-15 ENCOUNTER — Inpatient Hospital Stay (HOSPITAL_COMMUNITY)
Admission: EM | Admit: 2021-12-15 | Discharge: 2021-12-31 | DRG: 100 | Disposition: A | Discharge status: Other Acute Inpatient Hospital | Payer: Medicare Other | Source: Skilled Nursing Facility | Attending: Internal Medicine | Admitting: Internal Medicine

## 2021-12-15 ENCOUNTER — Inpatient Hospital Stay (HOSPITAL_COMMUNITY): Payer: Medicare Other

## 2021-12-15 ENCOUNTER — Encounter (HOSPITAL_COMMUNITY)
Admission: RE | Disposition: A | Discharge status: Skilled Nursing Facility | Payer: Self-pay | Source: Home / Self Care | Attending: Internal Medicine

## 2021-12-15 DIAGNOSIS — D696 Thrombocytopenia, unspecified: Secondary | ICD-10-CM | POA: Diagnosis present

## 2021-12-15 DIAGNOSIS — E875 Hyperkalemia: Secondary | ICD-10-CM | POA: Diagnosis not present

## 2021-12-15 DIAGNOSIS — Z86010 Personal history of colonic polyps: Secondary | ICD-10-CM | POA: Insufficient documentation

## 2021-12-15 DIAGNOSIS — E1122 Type 2 diabetes mellitus with diabetic chronic kidney disease: Secondary | ICD-10-CM | POA: Diagnosis present

## 2021-12-15 DIAGNOSIS — G40211 Localization-related (focal) (partial) symptomatic epilepsy and epileptic syndromes with complex partial seizures, intractable, with status epilepticus: Secondary | ICD-10-CM | POA: Diagnosis not present

## 2021-12-15 DIAGNOSIS — R400 Somnolence: Secondary | ICD-10-CM

## 2021-12-15 DIAGNOSIS — R531 Weakness: Secondary | ICD-10-CM | POA: Diagnosis not present

## 2021-12-15 DIAGNOSIS — F32A Depression, unspecified: Secondary | ICD-10-CM | POA: Diagnosis present

## 2021-12-15 DIAGNOSIS — T426X5A Adverse effect of other antiepileptic and sedative-hypnotic drugs, initial encounter: Secondary | ICD-10-CM | POA: Diagnosis not present

## 2021-12-15 DIAGNOSIS — E11649 Type 2 diabetes mellitus with hypoglycemia without coma: Secondary | ICD-10-CM | POA: Diagnosis present

## 2021-12-15 DIAGNOSIS — R4701 Aphasia: Secondary | ICD-10-CM | POA: Diagnosis present

## 2021-12-15 DIAGNOSIS — Z794 Long term (current) use of insulin: Secondary | ICD-10-CM

## 2021-12-15 DIAGNOSIS — D62 Acute posthemorrhagic anemia: Secondary | ICD-10-CM | POA: Diagnosis not present

## 2021-12-15 DIAGNOSIS — E785 Hyperlipidemia, unspecified: Secondary | ICD-10-CM | POA: Diagnosis present

## 2021-12-15 DIAGNOSIS — G40101 Localization-related (focal) (partial) symptomatic epilepsy and epileptic syndromes with simple partial seizures, not intractable, with status epilepticus: Principal | ICD-10-CM | POA: Diagnosis present

## 2021-12-15 DIAGNOSIS — M7989 Other specified soft tissue disorders: Secondary | ICD-10-CM | POA: Diagnosis not present

## 2021-12-15 DIAGNOSIS — T45615A Adverse effect of thrombolytic drugs, initial encounter: Secondary | ICD-10-CM | POA: Diagnosis not present

## 2021-12-15 DIAGNOSIS — R1312 Dysphagia, oropharyngeal phase: Secondary | ICD-10-CM | POA: Diagnosis present

## 2021-12-15 DIAGNOSIS — R34 Anuria and oliguria: Secondary | ICD-10-CM | POA: Diagnosis present

## 2021-12-15 DIAGNOSIS — L89159 Pressure ulcer of sacral region, unspecified stage: Secondary | ICD-10-CM | POA: Diagnosis not present

## 2021-12-15 DIAGNOSIS — Z833 Family history of diabetes mellitus: Secondary | ICD-10-CM

## 2021-12-15 DIAGNOSIS — R54 Age-related physical debility: Secondary | ICD-10-CM | POA: Diagnosis present

## 2021-12-15 DIAGNOSIS — I5032 Chronic diastolic (congestive) heart failure: Secondary | ICD-10-CM | POA: Diagnosis present

## 2021-12-15 DIAGNOSIS — Z6821 Body mass index (BMI) 21.0-21.9, adult: Secondary | ICD-10-CM

## 2021-12-15 DIAGNOSIS — K861 Other chronic pancreatitis: Secondary | ICD-10-CM | POA: Diagnosis present

## 2021-12-15 DIAGNOSIS — I132 Hypertensive heart and chronic kidney disease with heart failure and with stage 5 chronic kidney disease, or end stage renal disease: Secondary | ICD-10-CM | POA: Diagnosis present

## 2021-12-15 DIAGNOSIS — Z87442 Personal history of urinary calculi: Secondary | ICD-10-CM

## 2021-12-15 DIAGNOSIS — Z5309 Procedure and treatment not carried out because of other contraindication: Secondary | ICD-10-CM | POA: Diagnosis present

## 2021-12-15 DIAGNOSIS — K529 Noninfective gastroenteritis and colitis, unspecified: Secondary | ICD-10-CM | POA: Insufficient documentation

## 2021-12-15 DIAGNOSIS — R4182 Altered mental status, unspecified: Secondary | ICD-10-CM | POA: Diagnosis present

## 2021-12-15 DIAGNOSIS — E538 Deficiency of other specified B group vitamins: Secondary | ICD-10-CM | POA: Diagnosis present

## 2021-12-15 DIAGNOSIS — G40901 Epilepsy, unspecified, not intractable, with status epilepticus: Secondary | ICD-10-CM | POA: Diagnosis present

## 2021-12-15 DIAGNOSIS — N186 End stage renal disease: Secondary | ICD-10-CM | POA: Diagnosis present

## 2021-12-15 DIAGNOSIS — J9601 Acute respiratory failure with hypoxia: Secondary | ICD-10-CM | POA: Diagnosis not present

## 2021-12-15 DIAGNOSIS — R64 Cachexia: Secondary | ICD-10-CM | POA: Diagnosis present

## 2021-12-15 DIAGNOSIS — T82838A Hemorrhage of vascular prosthetic devices, implants and grafts, initial encounter: Secondary | ICD-10-CM | POA: Diagnosis not present

## 2021-12-15 DIAGNOSIS — J9600 Acute respiratory failure, unspecified whether with hypoxia or hypercapnia: Secondary | ICD-10-CM | POA: Diagnosis not present

## 2021-12-15 DIAGNOSIS — D631 Anemia in chronic kidney disease: Secondary | ICD-10-CM | POA: Diagnosis present

## 2021-12-15 DIAGNOSIS — K219 Gastro-esophageal reflux disease without esophagitis: Secondary | ICD-10-CM

## 2021-12-15 DIAGNOSIS — N2581 Secondary hyperparathyroidism of renal origin: Secondary | ICD-10-CM | POA: Diagnosis present

## 2021-12-15 DIAGNOSIS — Z8673 Personal history of transient ischemic attack (TIA), and cerebral infarction without residual deficits: Secondary | ICD-10-CM

## 2021-12-15 DIAGNOSIS — R011 Cardiac murmur, unspecified: Secondary | ICD-10-CM | POA: Diagnosis present

## 2021-12-15 DIAGNOSIS — J181 Lobar pneumonia, unspecified organism: Secondary | ICD-10-CM | POA: Diagnosis not present

## 2021-12-15 DIAGNOSIS — I2510 Atherosclerotic heart disease of native coronary artery without angina pectoris: Secondary | ICD-10-CM | POA: Diagnosis present

## 2021-12-15 DIAGNOSIS — Z888 Allergy status to other drugs, medicaments and biological substances status: Secondary | ICD-10-CM

## 2021-12-15 DIAGNOSIS — F17290 Nicotine dependence, other tobacco product, uncomplicated: Secondary | ICD-10-CM | POA: Diagnosis present

## 2021-12-15 DIAGNOSIS — Z87898 Personal history of other specified conditions: Secondary | ICD-10-CM | POA: Diagnosis not present

## 2021-12-15 DIAGNOSIS — R1310 Dysphagia, unspecified: Secondary | ICD-10-CM | POA: Insufficient documentation

## 2021-12-15 DIAGNOSIS — D649 Anemia, unspecified: Secondary | ICD-10-CM

## 2021-12-15 DIAGNOSIS — R578 Other shock: Secondary | ICD-10-CM | POA: Diagnosis not present

## 2021-12-15 DIAGNOSIS — R404 Transient alteration of awareness: Secondary | ICD-10-CM | POA: Diagnosis not present

## 2021-12-15 DIAGNOSIS — E1142 Type 2 diabetes mellitus with diabetic polyneuropathy: Secondary | ICD-10-CM | POA: Diagnosis present

## 2021-12-15 DIAGNOSIS — E871 Hypo-osmolality and hyponatremia: Secondary | ICD-10-CM | POA: Diagnosis present

## 2021-12-15 DIAGNOSIS — Z886 Allergy status to analgesic agent status: Secondary | ICD-10-CM

## 2021-12-15 DIAGNOSIS — Z992 Dependence on renal dialysis: Secondary | ICD-10-CM

## 2021-12-15 DIAGNOSIS — E43 Unspecified severe protein-calorie malnutrition: Secondary | ICD-10-CM | POA: Diagnosis present

## 2021-12-15 DIAGNOSIS — G8191 Hemiplegia, unspecified affecting right dominant side: Secondary | ICD-10-CM | POA: Diagnosis present

## 2021-12-15 DIAGNOSIS — Z789 Other specified health status: Secondary | ICD-10-CM | POA: Diagnosis not present

## 2021-12-15 DIAGNOSIS — K3184 Gastroparesis: Secondary | ICD-10-CM | POA: Diagnosis present

## 2021-12-15 DIAGNOSIS — E559 Vitamin D deficiency, unspecified: Secondary | ICD-10-CM | POA: Diagnosis present

## 2021-12-15 DIAGNOSIS — J440 Chronic obstructive pulmonary disease with acute lower respiratory infection: Secondary | ICD-10-CM | POA: Diagnosis not present

## 2021-12-15 DIAGNOSIS — L89151 Pressure ulcer of sacral region, stage 1: Secondary | ICD-10-CM | POA: Diagnosis not present

## 2021-12-15 DIAGNOSIS — R627 Adult failure to thrive: Secondary | ICD-10-CM | POA: Diagnosis present

## 2021-12-15 DIAGNOSIS — R569 Unspecified convulsions: Secondary | ICD-10-CM | POA: Diagnosis not present

## 2021-12-15 DIAGNOSIS — E1165 Type 2 diabetes mellitus with hyperglycemia: Secondary | ICD-10-CM | POA: Diagnosis not present

## 2021-12-15 DIAGNOSIS — M898X9 Other specified disorders of bone, unspecified site: Secondary | ICD-10-CM | POA: Diagnosis present

## 2021-12-15 DIAGNOSIS — E876 Hypokalemia: Secondary | ICD-10-CM | POA: Diagnosis not present

## 2021-12-15 DIAGNOSIS — Z515 Encounter for palliative care: Secondary | ICD-10-CM | POA: Diagnosis not present

## 2021-12-15 DIAGNOSIS — Z79899 Other long term (current) drug therapy: Secondary | ICD-10-CM

## 2021-12-15 DIAGNOSIS — E1143 Type 2 diabetes mellitus with diabetic autonomic (poly)neuropathy: Secondary | ICD-10-CM | POA: Diagnosis present

## 2021-12-15 DIAGNOSIS — R1319 Other dysphagia: Secondary | ICD-10-CM

## 2021-12-15 DIAGNOSIS — H518 Other specified disorders of binocular movement: Secondary | ICD-10-CM | POA: Diagnosis present

## 2021-12-15 LAB — DIFFERENTIAL
Abs Immature Granulocytes: 0.01 10*3/uL (ref 0.00–0.07)
Basophils Absolute: 0 10*3/uL (ref 0.0–0.1)
Basophils Relative: 1 %
Eosinophils Absolute: 0.1 10*3/uL (ref 0.0–0.5)
Eosinophils Relative: 4 %
Immature Granulocytes: 0 %
Lymphocytes Relative: 27 %
Lymphs Abs: 0.9 10*3/uL (ref 0.7–4.0)
Monocytes Absolute: 0.4 10*3/uL (ref 0.1–1.0)
Monocytes Relative: 12 %
Neutro Abs: 2 10*3/uL (ref 1.7–7.7)
Neutrophils Relative %: 56 %

## 2021-12-15 LAB — I-STAT CHEM 8, ED
BUN: 50 mg/dL — ABNORMAL HIGH (ref 8–23)
Calcium, Ion: 1.16 mmol/L (ref 1.15–1.40)
Chloride: 96 mmol/L — ABNORMAL LOW (ref 98–111)
Creatinine, Ser: 4.4 mg/dL — ABNORMAL HIGH (ref 0.44–1.00)
Glucose, Bld: 130 mg/dL — ABNORMAL HIGH (ref 70–99)
HCT: 44 % (ref 36.0–46.0)
Hemoglobin: 15 g/dL (ref 12.0–15.0)
Potassium: 7.1 mmol/L (ref 3.5–5.1)
Sodium: 121 mmol/L — ABNORMAL LOW (ref 135–145)
TCO2: 22 mmol/L (ref 22–32)

## 2021-12-15 LAB — PROTIME-INR
INR: 1.1 (ref 0.8–1.2)
Prothrombin Time: 13.7 seconds (ref 11.4–15.2)

## 2021-12-15 LAB — RENAL FUNCTION PANEL
Albumin: 3.5 g/dL (ref 3.5–5.0)
Anion gap: 18 — ABNORMAL HIGH (ref 5–15)
BUN: 55 mg/dL — ABNORMAL HIGH (ref 8–23)
CO2: 20 mmol/L — ABNORMAL LOW (ref 22–32)
Calcium: 10 mg/dL (ref 8.9–10.3)
Chloride: 88 mmol/L — ABNORMAL LOW (ref 98–111)
Creatinine, Ser: 4.16 mg/dL — ABNORMAL HIGH (ref 0.44–1.00)
GFR, Estimated: 11 mL/min — ABNORMAL LOW (ref >60)
Glucose, Bld: 71 mg/dL (ref 70–99)
Phosphorus: 4.3 mg/dL (ref 2.5–4.6)
Potassium: 7.2 mmol/L (ref 3.5–5.1)
Sodium: 126 mmol/L — ABNORMAL LOW (ref 135–145)

## 2021-12-15 LAB — CBC
HCT: 39 % (ref 36.0–46.0)
Hemoglobin: 12.3 g/dL (ref 12.0–15.0)
MCH: 31.5 pg (ref 26.0–34.0)
MCHC: 31.5 g/dL (ref 30.0–36.0)
MCV: 100 fL (ref 80.0–100.0)
Platelets: 112 10*3/uL — ABNORMAL LOW (ref 150–400)
RBC: 3.9 MIL/uL (ref 3.87–5.11)
RDW: 13.8 % (ref 11.5–15.5)
WBC: 3.5 10*3/uL — ABNORMAL LOW (ref 4.0–10.5)
nRBC: 0 % (ref 0.0–0.2)

## 2021-12-15 LAB — BASIC METABOLIC PANEL
Anion gap: 11 (ref 5–15)
BUN: 55 mg/dL — ABNORMAL HIGH (ref 8–23)
CO2: 23 mmol/L (ref 22–32)
Calcium: 9.5 mg/dL (ref 8.9–10.3)
Chloride: 90 mmol/L — ABNORMAL LOW (ref 98–111)
Creatinine, Ser: 4.12 mg/dL — ABNORMAL HIGH (ref 0.44–1.00)
GFR, Estimated: 11 mL/min — ABNORMAL LOW (ref >60)
Glucose, Bld: 116 mg/dL — ABNORMAL HIGH (ref 70–99)
Potassium: 6.2 mmol/L — ABNORMAL HIGH (ref 3.5–5.1)
Sodium: 124 mmol/L — ABNORMAL LOW (ref 135–145)

## 2021-12-15 LAB — COMPREHENSIVE METABOLIC PANEL
ALT: 45 U/L — ABNORMAL HIGH (ref 0–44)
AST: 41 U/L (ref 15–41)
Albumin: 4.4 g/dL (ref 3.5–5.0)
Alkaline Phosphatase: 545 U/L — ABNORMAL HIGH (ref 38–126)
Anion gap: 13 (ref 5–15)
BUN: 55 mg/dL — ABNORMAL HIGH (ref 8–23)
CO2: 21 mmol/L — ABNORMAL LOW (ref 22–32)
Calcium: 9.8 mg/dL (ref 8.9–10.3)
Chloride: 89 mmol/L — ABNORMAL LOW (ref 98–111)
Creatinine, Ser: 3.79 mg/dL — ABNORMAL HIGH (ref 0.44–1.00)
GFR, Estimated: 12 mL/min — ABNORMAL LOW (ref >60)
Glucose, Bld: 131 mg/dL — ABNORMAL HIGH (ref 70–99)
Potassium: 7.2 mmol/L (ref 3.5–5.1)
Sodium: 123 mmol/L — ABNORMAL LOW (ref 135–145)
Total Bilirubin: 0.8 mg/dL (ref 0.3–1.2)
Total Protein: 9 g/dL — ABNORMAL HIGH (ref 6.5–8.1)

## 2021-12-15 LAB — POCT I-STAT, CHEM 8
BUN: 52 mg/dL — ABNORMAL HIGH (ref 8–23)
Calcium, Ion: 0.84 mmol/L — CL (ref 1.15–1.40)
Chloride: 99 mmol/L (ref 98–111)
Creatinine, Ser: 4.1 mg/dL — ABNORMAL HIGH (ref 0.44–1.00)
Glucose, Bld: 127 mg/dL — ABNORMAL HIGH (ref 70–99)
HCT: 44 % (ref 36.0–46.0)
Hemoglobin: 15 g/dL (ref 12.0–15.0)
Potassium: 6.5 mmol/L (ref 3.5–5.1)
Sodium: 119 mmol/L — CL (ref 135–145)
TCO2: 17 mmol/L — ABNORMAL LOW (ref 22–32)

## 2021-12-15 LAB — APTT: aPTT: 28 seconds (ref 24–36)

## 2021-12-15 LAB — GLUCOSE, CAPILLARY
Glucose-Capillary: 131 mg/dL — ABNORMAL HIGH (ref 70–99)
Glucose-Capillary: 71 mg/dL (ref 70–99)
Glucose-Capillary: 85 mg/dL (ref 70–99)

## 2021-12-15 LAB — CBG MONITORING, ED
Glucose-Capillary: 175 mg/dL — ABNORMAL HIGH (ref 70–99)
Glucose-Capillary: 39 mg/dL — CL (ref 70–99)
Glucose-Capillary: 118 mg/dL — ABNORMAL HIGH (ref 70–99)

## 2021-12-15 LAB — MRSA NEXT GEN BY PCR, NASAL: MRSA by PCR Next Gen: NOT DETECTED

## 2021-12-15 LAB — HEPATITIS B SURFACE ANTIGEN: Hepatitis B Surface Ag: NONREACTIVE

## 2021-12-15 LAB — ETHANOL: Alcohol, Ethyl (B): <10 mg/dL (ref <10)

## 2021-12-15 SURGERY — COLONOSCOPY WITH PROPOFOL
Anesthesia: Monitor Anesthesia Care

## 2021-12-15 MED ORDER — ONDANSETRON HCL 4 MG/2ML IJ SOLN: 4.0000 mg | Freq: Four times a day (QID) | INTRAMUSCULAR | Status: DC | PRN

## 2021-12-15 MED ORDER — ALBUTEROL SULFATE (2.5 MG/3ML) 0.083% IN NEBU
15.0000 mg | INHALATION_SOLUTION | Freq: Once | RESPIRATORY_TRACT | Status: AC
  Administered 2021-12-15: 15 mg via RESPIRATORY_TRACT
  Filled 2021-12-15 (×2): qty 18

## 2021-12-15 MED ORDER — POLYETHYLENE GLYCOL 3350 17 G PO PACK: 17.0000 g | PACK | Freq: Every day | ORAL | Status: DC | PRN

## 2021-12-15 MED ORDER — LACTATED RINGERS IV SOLN: INTRAVENOUS | Status: DC

## 2021-12-15 MED ORDER — INSULIN ASPART 100 UNIT/ML IJ SOLN
0.0000 [IU] | INTRAMUSCULAR | Status: DC
  Administered 2021-12-15: 2 [IU] via SUBCUTANEOUS
  Filled 2021-12-15: qty 1

## 2021-12-15 MED ORDER — DEXTROSE 50 % IV SOLN
INTRAVENOUS | Status: AC
  Filled 2021-12-15: qty 50

## 2021-12-15 MED ORDER — DEXTROSE 50 % IV SOLN
1.0000 | Freq: Once | INTRAVENOUS | Status: AC
  Administered 2021-12-16: 50 mL via INTRAVENOUS
  Filled 2021-12-15: qty 50

## 2021-12-15 MED ORDER — DOCUSATE SODIUM 100 MG PO CAPS: 100.0000 mg | ORAL_CAPSULE | Freq: Two times a day (BID) | ORAL | Status: DC | PRN

## 2021-12-15 MED ORDER — SODIUM POLYSTYRENE SULFONATE 15 GM/60ML PO SUSP
60.0000 g | Freq: Once | ORAL | Status: DC
  Filled 2021-12-15: qty 240

## 2021-12-15 MED ORDER — ACETAMINOPHEN 325 MG PO TABS
650.0000 mg | ORAL_TABLET | ORAL | Status: DC | PRN
  Administered 2021-12-19: 650 mg via ORAL
  Filled 2021-12-15: qty 2

## 2021-12-15 MED ORDER — IOHEXOL 350 MG/ML SOLN
100.0000 mL | Freq: Once | INTRAVENOUS | Status: AC | PRN
  Administered 2021-12-15: 100 mL via INTRAVENOUS

## 2021-12-15 MED ORDER — HYDRALAZINE HCL 20 MG/ML IJ SOLN
10.0000 mg | INTRAMUSCULAR | Status: DC | PRN
  Administered 2021-12-16: 10 mg via INTRAVENOUS
  Filled 2021-12-15: qty 1

## 2021-12-15 MED ORDER — INSULIN ASPART 100 UNIT/ML IJ SOLN
0.0000 [IU] | INTRAMUSCULAR | Status: DC
  Administered 2021-12-16 – 2021-12-18 (×8): 1 [IU] via SUBCUTANEOUS
  Administered 2021-12-19: 2 [IU] via SUBCUTANEOUS
  Administered 2021-12-19: 1 [IU] via SUBCUTANEOUS
  Administered 2021-12-19: 3 [IU] via SUBCUTANEOUS
  Administered 2021-12-19: 2 [IU] via SUBCUTANEOUS
  Administered 2021-12-20 (×2): 1 [IU] via SUBCUTANEOUS
  Administered 2021-12-20: 2 [IU] via SUBCUTANEOUS
  Administered 2021-12-20: 1 [IU] via SUBCUTANEOUS
  Administered 2021-12-20: 3 [IU] via SUBCUTANEOUS
  Administered 2021-12-21: 2 [IU] via SUBCUTANEOUS
  Administered 2021-12-21 (×2): 3 [IU] via SUBCUTANEOUS

## 2021-12-15 MED ORDER — ALBUTEROL SULFATE (2.5 MG/3ML) 0.083% IN NEBU: 10.0000 mg | INHALATION_SOLUTION | Freq: Once | RESPIRATORY_TRACT | Status: DC

## 2021-12-15 MED ORDER — SODIUM BICARBONATE 8.4 % IV SOLN
100.0000 meq | Freq: Once | INTRAVENOUS | Status: AC
  Administered 2021-12-16: 100 meq via INTRAVENOUS
  Filled 2021-12-15: qty 50

## 2021-12-15 MED ORDER — SODIUM POLYSTYRENE SULFONATE 15 GM/60ML PO SUSP: 60.0000 g | Freq: Once | ORAL | Status: DC

## 2021-12-15 MED ORDER — CALCIUM GLUCONATE-NACL 1-0.675 GM/50ML-% IV SOLN
1.0000 g | Freq: Once | INTRAVENOUS | Status: AC
  Administered 2021-12-15: 1000 mg via INTRAVENOUS
  Filled 2021-12-15: qty 50

## 2021-12-15 MED ORDER — CALCIUM GLUCONATE 10 % IV SOLN
1.0000 g | Freq: Once | INTRAVENOUS | Status: AC
  Administered 2021-12-15: 1 g via INTRAVENOUS
  Filled 2021-12-15: qty 10

## 2021-12-15 MED ORDER — LORAZEPAM 2 MG/ML IJ SOLN
0.5000 mg | Freq: Once | INTRAMUSCULAR | Status: AC
  Administered 2021-12-15: 0.5 mg via INTRAVENOUS

## 2021-12-15 MED ORDER — LORAZEPAM 2 MG/ML IJ SOLN
INTRAMUSCULAR | Status: AC
  Filled 2021-12-15: qty 1

## 2021-12-15 MED ORDER — DEXTROSE 10 % IV SOLN: INTRAVENOUS | Status: DC

## 2021-12-15 MED ORDER — TENECTEPLASE FOR STROKE: 0.2500 mg/kg | PACK | Freq: Once | INTRAVENOUS | Status: AC

## 2021-12-15 MED ORDER — SODIUM BICARBONATE 8.4 % IV SOLN
50.0000 meq | Freq: Once | INTRAVENOUS | Status: AC
  Administered 2021-12-15: 50 meq via INTRAVENOUS
  Filled 2021-12-15: qty 50

## 2021-12-15 MED ORDER — INSULIN ASPART 100 UNIT/ML IV SOLN
5.0000 [IU] | Freq: Once | INTRAVENOUS | Status: AC
  Administered 2021-12-16: 5 [IU] via INTRAVENOUS

## 2021-12-15 MED ORDER — CALCIUM GLUCONATE 10 % IV SOLN
1.0000 g | Freq: Once | INTRAVENOUS | Status: AC
  Administered 2021-12-16: 1 g via INTRAVENOUS
  Filled 2021-12-15: qty 10

## 2021-12-15 MED ORDER — SODIUM POLYSTYRENE SULFONATE 15 GM/60ML PO SUSP: 30.0000 g | Freq: Once | ORAL | Status: DC

## 2021-12-15 MED ORDER — CHLORHEXIDINE GLUCONATE CLOTH 2 % EX PADS
6.0000 | MEDICATED_PAD | Freq: Every day | CUTANEOUS | Status: DC
  Administered 2021-12-15 – 2021-12-20 (×6): 6 via TOPICAL

## 2021-12-15 MED ORDER — LEVETIRACETAM IN NACL 500 MG/100ML IV SOLN
500.0000 mg | Freq: Two times a day (BID) | INTRAVENOUS | Status: DC
  Administered 2021-12-15 – 2021-12-17 (×4): 500 mg via INTRAVENOUS
  Filled 2021-12-15 (×3): qty 100

## 2021-12-15 MED ORDER — DEXTROSE 50 % IV SOLN
INTRAVENOUS | Status: AC
  Administered 2021-12-15: 50 mL via INTRAVENOUS
  Filled 2021-12-15: qty 50

## 2021-12-15 MED ORDER — TENECTEPLASE FOR STROKE
PACK | INTRAVENOUS | Status: AC
  Administered 2021-12-15: 12 mg via INTRAVENOUS
  Filled 2021-12-15: qty 10

## 2021-12-15 MED ORDER — CLEVIDIPINE BUTYRATE 0.5 MG/ML IV EMUL
0.0000 mg/h | INTRAVENOUS | Status: DC
  Filled 2021-12-15: qty 50

## 2021-12-15 MED ORDER — OXYCODONE-ACETAMINOPHEN 5-325 MG PO TABS: 1.0000 | ORAL_TABLET | Freq: Once | ORAL | Status: DC

## 2021-12-15 MED ORDER — DEXTROSE 50 % IV SOLN
1.0000 | Freq: Once | INTRAVENOUS | Status: AC
  Filled 2021-12-15: qty 50

## 2021-12-15 MED ORDER — INSULIN ASPART 100 UNIT/ML IV SOLN
5.0000 [IU] | Freq: Once | INTRAVENOUS | Status: AC
  Administered 2021-12-15: 5 [IU] via INTRAVENOUS

## 2021-12-15 NOTE — ED Notes (Signed)
Pt arrived back to room from CT.

## 2021-12-15 NOTE — H&P (Addendum)
NAME:  Erica [name], MRN:  [id number], DOB:  Sep 14, 1955, LOS: 0 ADMISSION DATE:  12/15/2021, CONSULTATION DATE:  12/15/2021  REFERRING MD:  Ova Freshwater, ED PA , CHIEF COMPLAINT: Altered mental status  History of Present Illness:  66 year old dialysis patient brought in from nursing home for altered mental status in the form of unresponsiveness.  She was LK W around 11 AM.  She was to have an endoscopy which was canceled when the members found snuff in her mouth.  On arrival to ED, she had left-sided gaze, right side was flaccid.  Head CT was negative and no LVO. TNKase was administered at 1349 Labs showed hyponatremia and K of 7.2, PCCM consulted for admission, transfer to [facility] planned Per nursing, at her baseline she is not able to walk due to deconditioning but is able to move all 4 extremities  Pertinent  Medical History  ESRD on HD, left arm AV fistula Hypertension Seizure disorder, on Keppra Diabetes type 2, on insulin    Significant Hospital Events: Including procedures, antibiotic start and stop dates in addition to other pertinent events     Interim History / Subjective:  Acutely ill-appearing Afebrile  Objective   Blood pressure 134/74, pulse 66, resp. rate (!) 21, weight 49.4 kg, SpO2 98 %.       No intake or output data in the 24 hours ending 12/15/21 1511 Filed Weights   12/15/21 1345  Weight: 49.4 kg    Examination: General: Acute on chronically ill-appearing, no distress HENT: Mild pallor, no icterus, coated tongue Lungs: Clear breath sounds bilateral, no accessory muscle use Cardiovascular: S1-S2 regular, no murmur Abdomen: Soft, nontender, no guarding Extremities: No edema, no deformity, left arm AV fistula with good thrill Neuro: Does not follow commands, awake, left gaze preference, more movement of right upper arm than on arrival, does not move right lower extremity, good movement of left upper extremity and lower extremity   Labs show  hyponatremia, hyperkalemia, mild leukopenia, stable hemoglobin EKG shows mildly prolonged QT, no hyperkalemic T waves  Resolved Hospital Problem list     Assessment & Plan:  Altered mental status with left gaze preference and right hemiplegia , possibly postictal versus acute CVA -head CT negative, CT angio negative for LVO, status post TNK by neurology  -Admit to neuro ICU at [facility] for neurochecks -Code stroke orders per neurology -Maintain blood pressure 180/105 or lower -Swallow eval depending on progress -MRI planned at some point -Obtain EEG, loaded with IV Keppra  ESRD on HD Hyperkalemia  -Receiving insulin, D50, calcium and bicarb -Renal to be contacted by ED -will need dialysis    Best Practice (right click and "Reselect all SmartList Selections" daily)   Diet/type: NPO DVT prophylaxis: SCD GI prophylaxis: H2B Lines: N/A Foley:  N/A Code Status:  full code Last date of multidisciplinary goals of care discussion [NA]  Labs   CBC: Recent Labs  Lab 12/15/21 0833 12/15/21 1325 12/15/21 1349  WBC  --  3.5*  --   NEUTROABS  --  2.0  --   HGB 15.0 12.3 15.0  HCT 44.0 39.0 44.0  MCV  --  100.0  --   PLT  --  112*  --     Basic Metabolic Panel: Recent Labs  Lab 12/15/21 0833 12/15/21 1325 12/15/21 1349  NA 119* 123* 121*  K 6.5* 7.2* 7.1*  CL 99 89* 96*  CO2  --  21*  --   GLUCOSE 127* 131* 130*  BUN 52*  55* 50*  CREATININE 4.10* 3.79* 4.40*  CALCIUM  --  9.8  --    GFR: Estimated Creatinine Clearance: 8.1 mL/min (A) (by C-G formula based on SCr of 4.4 mg/dL (H)). Recent Labs  Lab 12/15/21 1325  WBC 3.5*    Liver Function Tests: Recent Labs  Lab 12/15/21 1325  AST 41  ALT 45*  ALKPHOS 545*  BILITOT 0.8  PROT 9.0*  ALBUMIN 4.4   No results for input(s): "LIPASE", "AMYLASE" in the last 168 hours. No results for input(s): "AMMONIA" in the last 168 hours.  ABG    Component Value Date/Time   PHART 7.272 (L) 11/09/2017 1030   PCO2ART  21.0 (L) 11/09/2017 1030   PO2ART 82.8 (L) 11/09/2017 1030   HCO3 10.3 (L) 09/17/2020 1958   TCO2 22 12/15/2021 1349   ACIDBASEDEF 17.9 (H) 09/17/2020 1958   O2SAT 73.4 09/17/2020 1958     Coagulation Profile: Recent Labs  Lab 12/15/21 1325  INR 1.1    Cardiac Enzymes: No results for input(s): "CKTOTAL", "CKMB", "CKMBINDEX", "TROPONINI" in the last 168 hours.  HbA1C: HbA1c, POC (controlled diabetic range)  Date/Time Value Ref Range Status  11/25/2020 02:40 PM 8.1 (A) 0.0 - 7.0 % Final   Hgb A1c MFr Bld  Date/Time Value Ref Range Status  10/16/2021 05:08 AM 5.5 4.8 - 5.6 % Final    Comment:    (NOTE) Pre diabetes:          5.7%-6.4%  Diabetes:              >6.4%  Glycemic control for   <7.0% adults with diabetes   06/19/2021 08:55 AM 6.4 (H) 4.8 - 5.6 % Final    Comment:    (NOTE) Pre diabetes:          5.7%-6.4%  Diabetes:              >6.4%  Glycemic control for   <7.0% adults with diabetes     CBG: Recent Labs  Lab 12/15/21 0856  GLUCAP 131*    Review of Systems:   Unable to obtain  Past Medical History:  She,  has a past medical history of Alcohol-induced pancreatitis, CAD (coronary artery disease) (09/17/2020), Chronic diarrhea, Chronic kidney disease, Depression, Diabetes mellitus, Dialysis patient ([place]), Diastolic CHF ([place]), DKA (diabetic ketoacidoses), Gastroparesis, GERD (gastroesophageal reflux disease), Heart murmur, History of kidney stones, Hyperlipidemia, Hypertension, Hypokalemia, Muscle spasm, Neuropathic pain, Neuropathy, Pyelonephritis, Seizures ([place]), Vitamin B12 deficiency, and Vitamin D deficiency.   Surgical History:   Past Surgical History:  Procedure Laterality Date   AV FISTULA PLACEMENT Left 12/15/2017   Procedure: INSERTION OF ARTERIOVENOUS (AV) GORE-TEX GRAFT ARM;  Surgeon: Rosetta Posner, MD;  Location: [place];  Service: Vascular;  Laterality: Left;   CATARACT EXTRACTION W/PHACO Right 03/14/2015   Procedure: CATARACT  EXTRACTION PHACO AND INTRAOCULAR LENS PLACEMENT ([place]);  Surgeon: Baruch Goldmann, MD;  Location: [facility];  Service: Ophthalmology;  Laterality: Right;  CDE:11.13   CATARACT EXTRACTION W/PHACO Left 04/11/2015   Procedure: CATARACT EXTRACTION PHACO AND INTRAOCULAR LENS PLACEMENT LEFT EYE CDE=9.68;  Surgeon: Baruch Goldmann, MD;  Location: [facility];  Service: Ophthalmology;  Laterality: Left;   COLONOSCOPY  02/24/2010   fair prep, limited exam. Two small polyps removed. Tubular adenoma   cystoscopy with ureteral stent  Bilateral 10/07/2017   At [place] CV LINE RIGHT  11/17/2017   IR FLUORO GUIDE CV LINE RIGHT  11/22/2017   IR REMOVAL  TUN CV CATH W/O FL  01/26/2018   IR US GUIDE VASC ACCESS RIGHT  11/17/2017   MULTIPLE EXTRACTIONS WITH ALVEOLOPLASTY N/A 06/13/2012   Procedure: MULTIPLE EXTRACION WITH ALVEOLOPLASTY EXTRACT: 18, 19, 20, 21, 22, 24, 25, 27, 28, 29, 30, 31;  Surgeon: Gae Bon, DDS;  Location: [place];  Service: Oral Surgery;  Laterality: N/A;   ORIF HUMERUS FRACTURE Right 04/20/2019   Procedure: OPEN REDUCTION INTERNAL FIXATION (ORIF) PROXIMAL HUMERUS FRACTURE;  Surgeon: Marchia Bond, MD;  Location: [place];  Service: Orthopedics;  Laterality: Right;   REVISION OF ARTERIOVENOUS GORETEX GRAFT Left 11/04/2021   Procedure: REVISION OF LEFT UPPER ARM ARTERIOVENOUS GORETEX GRAFT WITH REPLACEMENT OF LATERAL ARTERIAL HALF OF GRAFT;  Surgeon: Rosetta Posner, MD;  Location: [facility];  Service: Vascular;  Laterality: Left;   TUBAL LIGATION     URETERAL STENT PLACEMENT  09/2017     Social History:   reports that she has never smoked. Her smokeless tobacco use includes snuff. She reports that she does not currently use alcohol after a past usage of about 1.0 standard drink of alcohol per week. She reports that she does not use drugs.   Family History:  Her family history includes Diabetes in her sister. There is no history of Chronic Renal Failure, Colon cancer, or Colon  polyps.   Allergies Allergies  Allergen Reactions   Aspirin Palpitations and Other (See Comments)    Listed on [facility] 11/12/18   Metformin And Related      Home Medications  Prior to Admission medications   Medication Sig Start Date End Date Taking? Authorizing Provider  acetaminophen (TYLENOL) 325 MG tablet Take 2 tablets (650 mg total) by mouth every 6 (six) hours as needed for mild pain (or Fever >/= 101). 10/19/21  Yes Allie Bossier, MD  albuterol (VENTOLIN HFA) 108 (90 Base) MCG/ACT inhaler Inhale 2 puffs into the lungs every 4 (four) hours as needed for wheezing or shortness of breath.    Yes [provider]  Biotin 10 MG TABS Take 10 mg by mouth daily at 12 noon.   Yes [provider]  calcitRIOL (ROCALTROL) 0.25 MCG capsule Take 1 capsule (0.25 mcg total) by mouth Every Tuesday,Thursday,and Saturday with dialysis. 12/18/17  Yes Love, Ivan Anchors, PA-C  carvedilol (COREG) 25 MG tablet Take 1 tablet (25 mg total) by mouth 2 (two) times daily. 10/19/21  Yes Allie Bossier, MD  ferric citrate (AURYXIA) 1 GM 210 MG(Fe) tablet Take 2 tablets (420 mg total) by mouth 3 (three) times daily with meals. 11/15/18  Yes Buriev, Arie Sabina, MD  folic acid (FOLVITE) 1 MG tablet Take 1 mg by mouth daily at 12 noon.   Yes [provider]  hydrALAZINE (APRESOLINE) 50 MG tablet Take 1 tablet (50 mg total) by mouth every 8 (eight) hours. 10/19/21  Yes Allie Bossier, MD  insulin aspart (NOVOLOG) 100 UNIT/ML injection Inject 1-3 Units into the skin See admin instructions. Inject per sliding scale at 2200 If BS 151-250= 1 unit 251- 350= 2 units 351-400= 3 units 401-500= 3 units Give subcutaneous at bedtime   Yes [provider]  levETIRAcetam (KEPPRA) 500 MG tablet Take 1 tablet (500 mg total) by mouth at bedtime. 10/19/21  Yes Allie Bossier, MD  lidocaine-prilocaine (EMLA) cream Apply 1 application. topically as directed. Apply to left arm fistula every tues, thurs, and sat for  hemodialysis   Yes [provider]  mirtazapine (REMERON) 30 MG tablet  Take 30 mg by mouth at bedtime.   Yes [provider]  Multiple Vitamin (MULTIVITAMIN ADULT PO) Take 1 tablet by mouth daily at 12 noon.   Yes [provider]  pantoprazole (PROTONIX) 40 MG tablet Take 1 tablet (40 mg total) by mouth daily. Patient taking differently: Take 40 mg by mouth daily at 12 noon. 11/19/19 12/15/21 Yes Oswald Hillock, MD  vitamin C (ASCORBIC ACID) 500 MG tablet Take 500 mg by mouth 2 (two) times daily.   Yes [provider]  zinc sulfate 220 (50 Zn) MG capsule Take 220 mg by mouth daily at 12 noon.   Yes [provider]  Darbepoetin Alfa (ARANESP) 200 MCG/0.4ML SOSY injection Inject 0.4 mLs (200 mcg total) into the vein every Saturday with hemodialysis. 10/25/21   Allie Bossier, MD     Critical care time: 56m       Kara Mead MD. [facility]. [place] [facility] Pager : [phone]  If no response to pager , please call [phone] until 7 pm After 7:00 pm call [place]  [phone]   12/15/2021

## 2021-12-15 NOTE — ED Notes (Signed)
[facility] contacted to set up transportation for ED to ED transfer to [facility]. Dr. Regenia Skeeter accepting.

## 2021-12-15 NOTE — ED Provider Notes (Cosign Needed Addendum)
[facility] EMERGENCY DEPARTMENT Provider Note   CSN: [id number] Arrival date & time: 12/15/21  1322  An emergency department physician performed an initial assessment on this suspected stroke patient at 1324.  History Chief Complaint  Patient presents with   Code Stroke    Erica [name] is a 65 y.o. female.  HPI Patient is a 66 year old female with past medical history significant for seizures on Keppra, hypertension, DKA, Saturday Tuesday Thursday dialysis, DM 2, HLD, gastroparesis, CHF, CAD  Patient presented emergency room today brought in by EMS with right-sided weakness of upper and lower extremity, left gaze deviation and aphasia.  Last seen well approximately noon after she returned back to [facility] and rehab center.  She was returning from a colonoscopy appointment however she did not actually have a colonoscopy done.  This was canceled because she had had copious amounts of stuff in her mouth.  According to facility, patient was normal at 12:00 noon and asked to go to sleep.  Soon after this CNA checked on her and found that she was not on responding to verbal stimulus and was gazing left.  RN evaluated and then nurse practitioner ultimately leading to 911 call and patient transported by EMS to ER.  She received a full dialysis session on Saturday and is compliant with her Tuesday Thursday Saturday dialysis schedule.  Seems that at her baseline she is able to talk and move her upper extremities without difficulty.  She can also move her lower extremity but cannot walk due to deconditioning during long hospital stay  Level 5 caveat patient is nonverbal at this time.  According to ability baseline for patient is talking and using her upper extremities normally.  Seems that she was deconditioned during her last hospital admission and is in rehab to complete physical therapy and hopefully walk again.     Home Medications Prior to Admission medications    Medication Sig Start Date End Date Taking? Authorizing Provider  acetaminophen (TYLENOL) 325 MG tablet Take 2 tablets (650 mg total) by mouth every 6 (six) hours as needed for mild pain (or Fever >/= 101). 10/19/21  Yes Allie Bossier, MD  albuterol (VENTOLIN HFA) 108 (90 Base) MCG/ACT inhaler Inhale 2 puffs into the lungs every 4 (four) hours as needed for wheezing or shortness of breath.    Yes [provider]  Biotin 10 MG TABS Take 10 mg by mouth daily at 12 noon.   Yes [provider]  calcitRIOL (ROCALTROL) 0.25 MCG capsule Take 1 capsule (0.25 mcg total) by mouth Every Tuesday,Thursday,and Saturday with dialysis. 12/18/17  Yes Love, Ivan Anchors, PA-C  carvedilol (COREG) 25 MG tablet Take 1 tablet (25 mg total) by mouth 2 (two) times daily. 10/19/21  Yes Allie Bossier, MD  ferric citrate (AURYXIA) 1 GM 210 MG(Fe) tablet Take 2 tablets (420 mg total) by mouth 3 (three) times daily with meals. 11/15/18  Yes Buriev, Arie Sabina, MD  folic acid (FOLVITE) 1 MG tablet Take 1 mg by mouth daily at 12 noon.   Yes [provider]  hydrALAZINE (APRESOLINE) 50 MG tablet Take 1 tablet (50 mg total) by mouth every 8 (eight) hours. 10/19/21  Yes Allie Bossier, MD  insulin aspart (NOVOLOG) 100 UNIT/ML injection Inject 1-3 Units into the skin See admin instructions. Inject per sliding scale at 2200 If BS 151-250= 1 unit 251- 350= 2 units 351-400= 3 units 401-500= 3 units Give subcutaneous at bedtime   Yes [provider]  levETIRAcetam (KEPPRA) 500 MG tablet Take 1 tablet (500 mg total) by mouth at bedtime. 10/19/21  Yes Allie Bossier, MD  lidocaine-prilocaine (EMLA) cream Apply 1 application. topically as directed. Apply to left arm fistula every tues, thurs, and sat for hemodialysis   Yes [provider]  mirtazapine (REMERON) 30 MG tablet Take 30 mg by mouth at bedtime.   Yes [provider]  Multiple Vitamin (MULTIVITAMIN ADULT PO) Take 1 tablet by mouth  daily at 12 noon.   Yes [provider]  pantoprazole (PROTONIX) 40 MG tablet Take 1 tablet (40 mg total) by mouth daily. Patient taking differently: Take 40 mg by mouth daily at 12 noon. 11/19/19 12/15/21 Yes Oswald Hillock, MD  vitamin C (ASCORBIC ACID) 500 MG tablet Take 500 mg by mouth 2 (two) times daily.   Yes [provider]  zinc sulfate 220 (50 Zn) MG capsule Take 220 mg by mouth daily at 12 noon.   Yes [provider]  Darbepoetin Alfa (ARANESP) 200 MCG/0.4ML SOSY injection Inject 0.4 mLs (200 mcg total) into the vein every Saturday with hemodialysis. 10/25/21   Allie Bossier, MD      Allergies    Aspirin and Metformin and related    Review of Systems   Review of Systems  Physical Exam Updated Vital Signs BP (!) 138/59   Pulse 74   Temp 98.1 F (36.7 C) (Oral)   Resp 17   Wt 49.4 kg   SpO2 100%   BMI 24.42 kg/m  Physical Exam Vitals and nursing note reviewed.  Constitutional:      Comments: 66 year old female appears somewhat older than stated age  HENT:     Head: Normocephalic and atraumatic.     Nose: Nose normal.  Eyes:     General: No scleral icterus. Cardiovascular:     Rate and Rhythm: Normal rate and regular rhythm.     Pulses: Normal pulses.     Heart sounds: Normal heart sounds.     Comments: Fistula left upper extremity with thrill Pulmonary:     Effort: Pulmonary effort is normal. No respiratory distress.     Breath sounds: No wheezing.  Abdominal:     Palpations: Abdomen is soft.     Tenderness: There is no abdominal tenderness.  Musculoskeletal:     Cervical back: Normal range of motion.     Right lower leg: No edema.     Left lower leg: No edema.  Skin:    General: Skin is warm and dry.     Capillary Refill: Capillary refill takes less than 2 seconds.     Comments: Left upper extremity fistula  Neurological:     Comments: Aphasic Follows commands with general weakness on left side and flaccid right upper  extremity.  Left lower extremity is weak.  There is left leg drift.  Left gaze deviation  Psychiatric:     Comments: Unable to assess     ED Results / Procedures / Treatments   Labs (all labs ordered are listed, but only abnormal results are displayed) Labs Reviewed  CBC - Abnormal; Notable for the following components:      Result Value   WBC 3.5 (*)    Platelets 112 (*)    All other components within normal limits  COMPREHENSIVE METABOLIC PANEL - Abnormal; Notable for the following components:   Sodium 123 (*)    Potassium 7.2 (*)    Chloride 89 (*)  CO2 21 (*)    Glucose, Bld 131 (*)    BUN 55 (*)    Creatinine, Ser 3.79 (*)    Total Protein 9.0 (*)    ALT 45 (*)    Alkaline Phosphatase 545 (*)    GFR, Estimated 12 (*)    All other components within normal limits  BASIC METABOLIC PANEL - Abnormal; Notable for the following components:   Sodium 124 (*)    Potassium 6.2 (*)    Chloride 90 (*)    Glucose, Bld 116 (*)    BUN 55 (*)    Creatinine, Ser 4.12 (*)    GFR, Estimated 11 (*)    All other components within normal limits  I-STAT CHEM 8, ED - Abnormal; Notable for the following components:   Sodium 121 (*)    Potassium 7.1 (*)    Chloride 96 (*)    BUN 50 (*)    Creatinine, Ser 4.40 (*)    Glucose, Bld 130 (*)    All other components within normal limits  CBG MONITORING, ED - Abnormal; Notable for the following components:   Glucose-Capillary 175 (*)    All other components within normal limits  ETHANOL  PROTIME-INR  APTT  DIFFERENTIAL  RAPID URINE DRUG SCREEN, HOSP PERFORMED  URINALYSIS, ROUTINE W REFLEX MICROSCOPIC  HEPATITIS B SURFACE ANTIGEN  HEPATITIS B SURFACE ANTIBODY, QUANTITATIVE  CBC  BASIC METABOLIC PANEL    EKG None  Radiology CT ANGIO HEAD NECK W WO CM W PERF (CODE STROKE)  Result Date: 12/15/2021 CLINICAL DATA:  Neuro deficit, acute, stroke suspected EXAM: CT ANGIOGRAPHY HEAD AND NECK CT PERFUSION BRAIN TECHNIQUE: Multidetector  CT imaging of the head and neck was performed using the standard protocol during bolus administration of intravenous contrast. Multiplanar CT image reconstructions and MIPs were obtained to evaluate the vascular anatomy. Carotid stenosis measurements (when applicable) are obtained utilizing NASCET criteria, using the distal internal carotid diameter as the denominator. Multiphase CT imaging of the brain was performed following IV bolus contrast injection. Subsequent parametric perfusion maps were calculated using RAPID software. RADIATION DOSE REDUCTION: This exam was performed according to the departmental dose-optimization program which includes automated exposure control, adjustment of the mA and/or kV according to patient size and/or use of iterative reconstruction technique. CONTRAST:  175mL OMNIPAQUE IOHEXOL 350 MG/ML SOLN COMPARISON:  None Available. FINDINGS: CTA NECK FINDINGS Aortic arch: Standard branching. Imaged portion shows no evidence of aneurysm or dissection. No significant stenosis of the major arch vessel origins. Right carotid system: No evidence of dissection, stenosis (50% or greater), or occlusion. Left carotid system: No evidence of dissection, stenosis (50% or greater), or occlusion. Vertebral arteries: Right dominant. No evidence of dissection, stenosis (50% or greater), or occlusion. Skeleton: Multilevel degenerative change including central disc protrusion at C3-C4. Other neck: No acute findings. Upper chest: Visualized lung apices are clear. Review of the MIP images confirms the above findings CTA HEAD FINDINGS Anterior circulation: Bilateral intracranial ICAs, MCAs, and ACAs are patent without proximal hemodynamically significant stenosis. Posterior circulation: Bilateral intradural vertebral arteries, basilar artery and bilateral posterior cerebral arteries are patent without proximal hemodynamically significant stenosis. Venous sinuses: As permitted by contrast timing, patent. CT  Brain Perfusion Findings: CBF (<30%) Volume: 70mL Perfusion (Tmax>6.0s) volume: 41mL Mismatch Volume: 60mL Infarction Location:None IMPRESSION: 1. No emergent large vessel occlusion or proximal hemodynamically significant stenosis. 2. Normal CT perfusion. Findings discussed with Dr. Leonel Ramsay via telephone 2:17 p.m. Electronically Signed   By: Jamesetta So.D.  On: 12/15/2021 14:23   CT HEAD CODE STROKE WO CONTRAST  Result Date: 12/15/2021 CLINICAL DATA:  Code stroke. Neuro deficit, acute, stroke suspected a series call [phone] EXAM: CT HEAD WITHOUT CONTRAST TECHNIQUE: Contiguous axial images were obtained from the base of the skull through the vertex without intravenous contrast. RADIATION DOSE REDUCTION: This exam was performed according to the departmental dose-optimization program which includes automated exposure control, adjustment of the mA and/or kV according to patient size and/or use of iterative reconstruction technique. COMPARISON:  CT head 10/12/2021. FINDINGS: Brain: No evidence of acute infarction, hemorrhage, hydrocephalus, extra-axial collection or mass lesion/mass effect. Patchy white matter hypodensities, nonspecific but compatible with chronic microvascular ischemic disease. Vascular: No hyperdense vessel identified. Skull: No acute fracture. Sinuses/Orbits: Visualized sinuses are clear. No acute orbital findings. Other: No mastoid effusions. ASPECTS [facility] Stroke Program Early CT Score) total score (0-10 with 10 being normal): 10. IMPRESSION: 1. No evidence of acute intracranial abnormality. 2. ASPECTS is 10. Code stroke imaging results were communicated on 12/15/2021 at 1:36 pm to provider Sharlett Iles via telephone, who verbally acknowledged these results. Electronically Signed   By: Margaretha Sheffield M.D.   On: 12/15/2021 13:37    Procedures .Critical Care  Performed by: Tedd Sias, PA Authorized by: Tedd Sias, PA   Critical care provider statement:     Critical care time (minutes):  35   Critical care time was exclusive of:  Separately billable procedures and treating other patients and teaching time   Critical care was necessary to treat or prevent imminent or life-threatening deterioration of the following conditions:  Metabolic crisis (stroke)   Critical care was time spent personally by me on the following activities:  Development of treatment plan with patient or surrogate, review of old charts, re-evaluation of patient's condition, pulse oximetry, ordering and review of radiographic studies, ordering and review of laboratory studies, ordering and performing treatments and interventions, obtaining history from patient or surrogate, examination of patient and evaluation of patient's response to treatment   Care discussed with: admitting provider   Ultrasound ED Peripheral IV (Provider)  Date/Time: 12/15/2021 6:08 PM  Performed by: Tedd Sias, PA Authorized by: Tedd Sias, PA   Procedure details:    Skin Prep: chlorhexidine gluconate     Location:  Right AC   Angiocath:  20 G   Bedside Ultrasound Guided: Yes     Images: not archived     Patient tolerated procedure without complications: Yes     Dressing applied: Yes       Medications Ordered in ED Medications  clevidipine (CLEVIPREX) infusion 0.5 mg/mL ( Intravenous Not Given 12/15/21 1346)  docusate sodium (COLACE) capsule 100 mg (has no administration in time range)  polyethylene glycol (MIRALAX / GLYCOLAX) packet 17 g (has no administration in time range)  ondansetron (ZOFRAN) injection 4 mg (has no administration in time range)  acetaminophen (TYLENOL) tablet 650 mg (has no administration in time range)  levETIRAcetam (KEPPRA) IVPB 500 mg/100 mL premix (500 mg Intravenous New Bag/Given 12/15/21 1549)  insulin aspart (novoLOG) injection 0-9 Units (2 Units Subcutaneous Given 12/15/21 1554)  hydrALAZINE (APRESOLINE) injection 10-40 mg (has no administration in time  range)  sodium polystyrene (KAYEXALATE) 15 GM/60ML suspension 60 g (0 g Rectal Hold 12/15/21 1732)  Chlorhexidine Gluconate Cloth 2 % PADS 6 each (has no administration in time range)  tenecteplase (TNKASE) injection for Stroke 12 mg (12 mg Intravenous Given 12/15/21 1349)  calcium gluconate 1 g/ 50 mL sodium  chloride IVPB (0 mg Intravenous Stopped 12/15/21 1522)  iohexol (OMNIPAQUE) 350 MG/ML injection 100 mL (100 mLs Intravenous Contrast Given 12/15/21 1358)  LORazepam (ATIVAN) injection 0.5 mg (0.5 mg Intravenous Given 12/15/21 1415)  insulin aspart (novoLOG) injection 5 Units (5 Units Intravenous Given 12/15/21 1509)    And  dextrose 50 % solution 50 mL (50 mLs Intravenous Given 12/15/21 1509)  calcium gluconate inj 10% (1 g) URGENT USE ONLY! (1 g Intravenous Given 12/15/21 1522)  albuterol (PROVENTIL) (2.5 MG/3ML) 0.083% nebulizer solution 15 mg (15 mg Nebulization Given 12/15/21 1536)  sodium bicarbonate injection 50 mEq (50 mEq Intravenous Given 12/15/21 1510)    ED Course/ Medical Decision Making/ A&P Clinical Course as of 12/15/21 1808  Mon Dec 15, 2021  1358 20G in [place] by [place] [WF]  1425 Saturday full dialysis (T/Th/Sat).  [WF]  9326 123 7.2 [WF]  [place] saw --> if no room by 5pm.   [WF]  [place] Dr. Hollie Salk of nephrology [WF]  [phone] Initiated ED-To-ED transfer [WF]  1708 ED to ED transfer to [facility]; [place] accepting. MICU bed ordered. Neurology and nephrology aware of pt (please update when they arrive) [WF]    Clinical Course User Index [WF] Tedd Sias, PA                           Medical Decision Making Amount and/or Complexity of Data Reviewed Labs: ordered. Radiology: ordered.  Risk OTC drugs. Prescription drug management. Decision regarding hospitalization.  This patient presents to the ED for concern of aphasia, limb weakness, this involves a number of treatment options, and is a complaint that carries with it a high risk of complications and  morbidity. A differential diagnosis was considered for the patient's symptoms which is discussed below:   Intracranial hemorrhage, Todd's paralysis, complex migraine, hypoglycemia   Co morbidities: Discussed in HPI   Brief History:  Patient is a 66 year old female with past medical history significant for seizures on Keppra, hypertension, DKA, Saturday Tuesday Thursday dialysis, DM 2, HLD, gastroparesis, CHF, CAD  Patient presented emergency room today brought in by EMS with right-sided weakness of upper and lower extremity, left gaze deviation and aphasia.  Last seen well approximately noon after she returned back to [facility] and rehab center.  She was returning from a colonoscopy appointment however she did not actually have a colonoscopy done.  This was canceled because she had had copious amounts of stuff in her mouth.  According to facility, patient was normal at 12:00 noon and asked to go to sleep.  Soon after this CNA checked on her and found that she was not on responding to verbal stimulus and was gazing left.  RN evaluated and then nurse practitioner ultimately leading to 911 call and patient transported by EMS to ER.  She received a full dialysis session on Saturday and is compliant with her Tuesday Thursday Saturday dialysis schedule.  Seems that at her baseline she is able to talk and move her upper extremities without difficulty.  She can also move her lower extremity but cannot walk due to deconditioning during long hospital stay  Level 5 caveat patient is nonverbal at this time.  According to ability baseline for patient is talking and using her upper extremities normally.  Seems that she was deconditioned during her last hospital admission and is in rehab to complete physical therapy and hopefully walk again.    EMR reviewed including pt PMHx,  past surgical history and past visits to ER.   See HPI for more details   Lab Tests:   I ordered and  independently interpreted labs. Labs notable for potassium elevated at 7.1 on i-STAT confirmed to be elevated at 7.2 with CMP.  Significantly hyponatremic with sodium of 123   Imaging Studies:  Personally ordered CT head code stroke which was without intracranial hemorrhage.  Discussed with Dr. Leonel Ramsay who is teleneurology today.  Patient is within TNK window.  She has symptoms concerning for large vessel occlusion.  She has right hemibody weakness and left gaze preference and has expressive aphasia.  Patient received TNK and underwent CT angio head and neck which does not show any large vessel occlusion.  She will ultimately need MRI  Cardiac Monitoring:  The patient was maintained on a cardiac monitor.  NSR EKG non-ischemic   Medicines ordered:  I ordered medication including TNK for stroke, also ordered calcium gluconate x2, sodium bicarb, insulin, dextrose and albuterol nebulized solution to treat patient's hyperkalemia Reevaluation of the patient after these medicines showed that the patient stayed the same I have reviewed the patients home medicines and have made adjustments as needed   Critical Interventions:     Consults/Attending Physician   I requested consultation with Dr. Leonel Ramsay,  and discussed lab and imaging findings as well as pertinent plan - they recommend: TNK administration, recommend transfer to [facility].  Initially we hope to send patient to ICU directly however no beds available.  Patient is now being transported by [facility] Dr. Verner Chol is excepting physician at [facility], ER.  Discussed with Dr. Hollie Salk of nephrology service who made recommendations for additional treatments for hyperkalemia.  We will provide patient with intrarectal Kayexalate.  Discussed with Dr. Elsworth Soho intensivist who agrees with ICU admission.  Reevaluation:  After the interventions noted above I re-evaluated patient and found that they have :stayed the same   On  reassessment patient now seems to be moving her right upper extremity some.  Still aphasic and with left gaze preference.   Social Determinants of Health:      Problem List / ED Course:  Strokelike symptoms -MRI pending did receive TNK Hyperkalemia of 7.2 has received insulin and glucose, sodium bicarb, calcium gluconate, albuterol nebulizer.  Nephrology aware patient Dr. Hollie Salk. Dialysis patient Prior to transfer to [facility] patient did have bleeding at her fistula site to left upper arm seems that she may have picked at this area.  No bystander report.  Combat gauze and Coban with fistula clamp applied.  Bleeding controlled. Dr. Doren Custard briefly discussed with Vascular surgery.   Nephrology, critical care, vascular surgery, neurology all aware of patient. ED to ED transfer to [facility].  Dispostion:  After consideration of the diagnostic results and the patients response to treatment, I feel that the patent would benefit from    Final Clinical Impression(s) / ED Diagnoses Final diagnoses:  None    Rx / DC Orders ED Discharge Orders     None         Tedd Sias, [place] 12/15/21 1808    Tedd Sias, [place] 12/15/21 1810    Fransico Meadow, MD 12/18/21 1114

## 2021-12-15 NOTE — ED Notes (Addendum)
Dr. Philip Aspen performing hemoccult at this time which was negative.

## 2021-12-15 NOTE — ED Notes (Signed)
Pt taken back to CT for Angio at this time with Lura Em, RN at bedside.

## 2021-12-15 NOTE — ED Notes (Signed)
Called to patients room by RT due to patient dialysis fistula bleeding. Pressure applied at this time. Dr. Doren Custard at bedside.

## 2021-12-15 NOTE — Progress Notes (Signed)
TELESTROKE RN    1325: Code stroke cart activated. Patient presently in CT at this time. Patient history and report provided to this RN by  ED RN. Patient arrived via EMS at 1322. LKW 1130 per SNF. Patient being assessed for left sided visual gaze. Patient's right arm and right leg are also flaccid.   1328: Dr. Leonel Ramsay paged at this time. 1330: Dr. Leonel Ramsay on cart to assess patient.  1331: Patient returned from [place]. Dr. Leonel Ramsay performing neuro exam. 1348: After calling patient's son, Dr. Leonel Ramsay made the decision to initiate TNK. TNK ordered by Dr. Leonel Ramsay. Dosing verified with nursing staff.   1349: TNK administered.  1421: Imaging negative. DR.Kirkpatrick logged off cart.   Berenice Bouton Telestroke RN

## 2021-12-15 NOTE — ED Notes (Signed)
Vascular MD Unk Lightning at bedside to assess fistula; removed clamp & dressing. Bleeding controlled, 2 ace wraps appled to L arm. CC PA Stephanie at bedside for assessment; this RN advised that pt still appropriate for ICU, probable HD as scheduled

## 2021-12-15 NOTE — Anesthesia Preprocedure Evaluation (Deleted)
Anesthesia Evaluation  Patient identified by MRN, date of birth, ID band Patient awake    Reviewed: Allergy & Precautions, NPO status , Patient's Chart, lab work & pertinent test results, reviewed documented beta blocker date and time   Airway        Dental  (+) Dental Advisory Given   Pulmonary pneumonia,    Pulmonary exam normal breath sounds clear to auscultation       Cardiovascular hypertension, Pt. on medications and Pt. on home beta blockers + CAD, + Past MI and +CHF  Normal cardiovascular exam+ Valvular Problems/Murmurs  Rhythm:Regular Rate:Normal  Left ventricle: The cavity size was normal. Moderate focal basal septal hypertrophy. Mild hypertrophy of the rest of the ventricle. No LV outflow tract gradient noted. Systolic function  was normal. The estimated ejection fraction was in the range of 60% to 65%. Wall motion was normal; there were no regional wall  motion abnormalities. Doppler parameters are consistent with  abnormal left ventricular relaxation (grade 1 diastolic  dysfunction).  - Aortic valve: There was no stenosis. There was trivial  regurgitation.  - Mitral valve: There was chordal systolic anterior motion but not  valvular SAM. Moderately calcified annulus. There was trivial  regurgitation.  - Right ventricle: The cavity size was normal. Systolic function  was normal.  - Right atrium: Catheter noted in RA.  - Tricuspid valve: Peak RV-RA gradient (S): 47 mm Hg.  - Pulmonary arteries: PA peak pressure: 50 mm Hg (S).  - Inferior vena cava: The vessel was normal in size. The  respirophasic diameter changes were in the normal range (>= 50%),  consistent with normal central venous pressure.    Neuro/Psych Seizures -,  PSYCHIATRIC DISORDERS Depression CVA    GI/Hepatic Neg liver ROS, GERD  Medicated,  Endo/Other  diabetes, Type 2, Oral Hypoglycemic Agents, Insulin Dependent   Renal/GU ESRF and DialysisRenal disease  negative genitourinary   Musculoskeletal negative musculoskeletal ROS (+)   Abdominal   Peds negative pediatric ROS (+)  Hematology  (+) Blood dyscrasia, anemia ,   Anesthesia Other Findings   Reproductive/Obstetrics negative OB ROS                             Anesthesia Physical Anesthesia Plan  ASA: 3  Anesthesia Plan: General   Post-op Pain Management: Minimal or no pain anticipated   Induction: Intravenous  PONV Risk Score and Plan: Propofol infusion  Airway Management Planned: Nasal Cannula and Natural Airway  Additional Equipment:   Intra-op Plan:   Post-operative Plan:   Informed Consent: I have reviewed the patients History and Physical, chart, labs and discussed the procedure including the risks, benefits and alternatives for the proposed anesthesia with the patient or authorized representative who has indicated his/her understanding and acceptance.       Plan Discussed with: CRNA and Surgeon  Anesthesia Plan Comments:         Anesthesia Quick Evaluation

## 2021-12-15 NOTE — ED Notes (Signed)
TNK administered at 1349.

## 2021-12-15 NOTE — ED Notes (Signed)
Per Dr. Leonel Ramsay he wants BP no lower at 180/105.

## 2021-12-15 NOTE — ED Notes (Signed)
Pt dialysis fistula clamped with 2 clamps and wrapped with gauze and coban at this time. Pulses present distal to the site.

## 2021-12-15 NOTE — Progress Notes (Signed)
Upon arrival to short stay patient had a mouth full of snuff, MD notified. Procedure cancelled today in the possibility of the patient swallowing contents of the snuff. Nursing home notified and discharged per caregiver and transportation.

## 2021-12-15 NOTE — Progress Notes (Signed)
eLink Physician-Brief Progress Note Patient Name: BRITANY CALLICOTT DOB: August 29, 1955 MRN: [id number]   Date of Service  12/15/2021  HPI/Events of Note  Hyperkalemia - K+ = 7.2.   eICU Interventions  Plan: Calcium gluconate 1 gm IV now. NaHCO3 100 meq IV now. D50 1 amp IV now. Novolog insulin 5 units IV now. Kayexalate enema 60 mg PR now. PCCM ground team notified that patient will need HD access soon rather than later.  Bedside nurse asked to notify Nephrology of K+ result in the event that they want to change the K+ in the HD bath.      Intervention Category Major Interventions: Electrolyte abnormality - evaluation and management  Takasha Vetere Eugene 12/15/2021, 11:25 PM

## 2021-12-15 NOTE — Consult Note (Signed)
Hospital Consult    Reason for Consult:  Bleeding AVF Requesting Physician:  ED MRN #:  [id number]  History of Present Illness: This is a 66 y.o. female well-known to the vascular surgery service having undergone revision of the left arm AV graft on 11/04/2021.  Unfortunately, she presented to the [facility], ED and a code stroke was called.  Tnk was given.  This led to hemorrhage from her AV graft which was controlled with a graft plan.  She was transferred to [facility] for further care.  Vascular surgery was called when the clamps were removed and bleeding was appreciated from the access site.  Manual pressure was held. On arrival, the dressing was removed, and hemostasis had been achieved with manual pressure.  There was no need for suture.  Patient had dense deficits   Past Medical History:  Diagnosis Date   Alcohol-induced pancreatitis    CAD (coronary artery disease) 09/17/2020   Chronic diarrhea    Chronic kidney disease    Depression    Diabetes mellitus    fasting blood sugar 110-120s   Dialysis patient ([place])    Diastolic CHF ([place])    DKA (diabetic ketoacidoses)    Gastroparesis    GERD (gastroesophageal reflux disease)    Heart murmur    History of kidney stones    Hyperlipidemia    Hypertension    Hypokalemia    Muscle spasm    Neuropathic pain    Neuropathy    Hx: of   Pyelonephritis    Seizures ([place])    Vitamin B12 deficiency    Vitamin D deficiency     Past Surgical History:  Procedure Laterality Date   AV FISTULA PLACEMENT Left 12/15/2017   Procedure: INSERTION OF ARTERIOVENOUS (AV) GORE-TEX GRAFT ARM;  Surgeon: Rosetta Posner, MD;  Location: [place];  Service: Vascular;  Laterality: Left;   CATARACT EXTRACTION W/PHACO Right 03/14/2015   Procedure: CATARACT EXTRACTION PHACO AND INTRAOCULAR LENS PLACEMENT ([place]);  Surgeon: Baruch Goldmann, MD;  Location: [facility];  Service: Ophthalmology;  Laterality: Right;  CDE:11.13   CATARACT EXTRACTION W/PHACO Left  04/11/2015   Procedure: CATARACT EXTRACTION PHACO AND INTRAOCULAR LENS PLACEMENT LEFT EYE CDE=9.68;  Surgeon: Baruch Goldmann, MD;  Location: [facility];  Service: Ophthalmology;  Laterality: Left;   COLONOSCOPY  02/24/2010   fair prep, limited exam. Two small polyps removed. Tubular adenoma   cystoscopy with ureteral stent  Bilateral 10/07/2017   At [place] CV LINE RIGHT  11/17/2017   IR FLUORO GUIDE CV LINE RIGHT  11/22/2017   IR REMOVAL TUN CV CATH W/O FL  01/26/2018   IR US GUIDE VASC ACCESS RIGHT  11/17/2017   MULTIPLE EXTRACTIONS WITH ALVEOLOPLASTY N/A 06/13/2012   Procedure: MULTIPLE EXTRACION WITH ALVEOLOPLASTY EXTRACT: 18, 19, 20, 21, 22, 24, 25, 27, 28, 29, 30, 31;  Surgeon: Gae Bon, DDS;  Location: [place];  Service: Oral Surgery;  Laterality: N/A;   ORIF HUMERUS FRACTURE Right 04/20/2019   Procedure: OPEN REDUCTION INTERNAL FIXATION (ORIF) PROXIMAL HUMERUS FRACTURE;  Surgeon: Marchia Bond, MD;  Location: [place];  Service: Orthopedics;  Laterality: Right;   REVISION OF ARTERIOVENOUS GORETEX GRAFT Left 11/04/2021   Procedure: REVISION OF LEFT UPPER ARM ARTERIOVENOUS GORETEX GRAFT WITH REPLACEMENT OF LATERAL ARTERIAL HALF OF GRAFT;  Surgeon: Rosetta Posner, MD;  Location: [facility];  Service: Vascular;  Laterality: Left;   TUBAL LIGATION     URETERAL STENT PLACEMENT  09/2017    Allergies  Allergen Reactions   Aspirin Palpitations and Other (See Comments)    Listed on [facility] 11/12/18   Metformin And Related     Prior to Admission medications   Medication Sig Start Date End Date Taking? Authorizing Provider  acetaminophen (TYLENOL) 325 MG tablet Take 2 tablets (650 mg total) by mouth every 6 (six) hours as needed for mild pain (or Fever >/= 101). 10/19/21  Yes Allie Bossier, MD  albuterol (VENTOLIN HFA) 108 (90 Base) MCG/ACT inhaler Inhale 2 puffs into the lungs every 4 (four) hours as needed for wheezing or shortness of breath.    Yes [provider]   Biotin 10 MG TABS Take 10 mg by mouth daily at 12 noon.   Yes [provider]  calcitRIOL (ROCALTROL) 0.25 MCG capsule Take 1 capsule (0.25 mcg total) by mouth Every Tuesday,Thursday,and Saturday with dialysis. 12/18/17  Yes Love, Ivan Anchors, PA-C  carvedilol (COREG) 25 MG tablet Take 1 tablet (25 mg total) by mouth 2 (two) times daily. 10/19/21  Yes Allie Bossier, MD  ferric citrate (AURYXIA) 1 GM 210 MG(Fe) tablet Take 2 tablets (420 mg total) by mouth 3 (three) times daily with meals. 11/15/18  Yes Buriev, Arie Sabina, MD  folic acid (FOLVITE) 1 MG tablet Take 1 mg by mouth daily at 12 noon.   Yes [provider]  hydrALAZINE (APRESOLINE) 50 MG tablet Take 1 tablet (50 mg total) by mouth every 8 (eight) hours. 10/19/21  Yes Allie Bossier, MD  insulin aspart (NOVOLOG) 100 UNIT/ML injection Inject 1-3 Units into the skin See admin instructions. Inject per sliding scale at 2200 If BS 151-250= 1 unit 251- 350= 2 units 351-400= 3 units 401-500= 3 units Give subcutaneous at bedtime   Yes [provider]  levETIRAcetam (KEPPRA) 500 MG tablet Take 1 tablet (500 mg total) by mouth at bedtime. 10/19/21  Yes Allie Bossier, MD  lidocaine-prilocaine (EMLA) cream Apply 1 application. topically as directed. Apply to left arm fistula every tues, thurs, and sat for hemodialysis   Yes [provider]  mirtazapine (REMERON) 30 MG tablet Take 30 mg by mouth at bedtime.   Yes [provider]  Multiple Vitamin (MULTIVITAMIN ADULT PO) Take 1 tablet by mouth daily at 12 noon.   Yes [provider]  pantoprazole (PROTONIX) 40 MG tablet Take 1 tablet (40 mg total) by mouth daily. Patient taking differently: Take 40 mg by mouth daily at 12 noon. 11/19/19 12/15/21 Yes Oswald Hillock, MD  vitamin C (ASCORBIC ACID) 500 MG tablet Take 500 mg by mouth 2 (two) times daily.   Yes [provider]  zinc sulfate 220 (50 Zn) MG capsule Take 220 mg by mouth daily at 12 noon.    Yes [provider]  Darbepoetin Alfa (ARANESP) 200 MCG/0.4ML SOSY injection Inject 0.4 mLs (200 mcg total) into the vein every Saturday with hemodialysis. 10/25/21   Allie Bossier, MD    Social History   Socioeconomic History   Marital status: Legally Separated    Spouse name: Not on file   Number of children: Not on file   Years of education: Not on file   Highest education level: Not on file  Occupational History   Not on file  Tobacco Use   Smoking status: Never   Smokeless tobacco: Current    Types: Snuff  Vaping Use   Vaping Use: Never used  Substance and Sexual Activity  Alcohol use: Not Currently    Alcohol/week: 1.0 standard drink of alcohol    Types: 1 Cans of beer per week   Drug use: No   Sexual activity: Not Currently  Other Topics Concern   Not on file  Social History Narrative   Not on file   Social Determinants of Health   Financial Resource Strain: Not on file  Food Insecurity: Not on file  Transportation Needs: Not on file  Physical Activity: Not on file  Stress: Not on file  Social Connections: Not on file  Intimate Partner Violence: Not on file   Family History  Problem Relation Age of Onset   Diabetes Sister    Chronic Renal Failure Neg Hx    Colon cancer Neg Hx    Colon polyps Neg Hx     ROS: Otherwise negative unless mentioned in HPI  Physical Examination  Vitals:   12/15/21 2023 12/15/21 2045  BP: (!) 154/80 108/80  Pulse: 80 81  Resp: (!) 21 (!) 22  Temp:    SpO2: 99% 97%   Body mass index is 24.42 kg/m.  General: No distress Gait: Not observed HENT: normocephalic Pulmonary: normal non-labored breathing Cardiac: regular and due to the Abdomen:  soft  Skin: without rashes Vascular Exam/Pulses: Pulsatile fistula with nice thrill more centrally Extremities: without ischemic changes, without Gangrene , without cellulitis; without open wounds;  Musculoskeletal: no muscle wasting or atrophy  Neurologic: Dense  neurodeficits, left-sided gaze Psychiatric:  The pt has Normal affect. Lymph:  Unremarkable  CBC    Component Value Date/Time   WBC 3.5 (L) 12/15/2021 1325   RBC 3.90 12/15/2021 1325   HGB 15.0 12/15/2021 1349   HCT 44.0 12/15/2021 1349   PLT 112 (L) 12/15/2021 1325   MCV 100.0 12/15/2021 1325   MCH 31.5 12/15/2021 1325   MCHC 31.5 12/15/2021 1325   RDW 13.8 12/15/2021 1325   LYMPHSABS 0.9 12/15/2021 1325   MONOABS 0.4 12/15/2021 1325   EOSABS 0.1 12/15/2021 1325   BASOSABS 0.0 12/15/2021 1325    BMET    Component Value Date/Time   NA 124 (L) 12/15/2021 1615   K 6.2 (H) 12/15/2021 1615   CL 90 (L) 12/15/2021 1615   CO2 23 12/15/2021 1615   GLUCOSE 116 (H) 12/15/2021 1615   BUN 55 (H) 12/15/2021 1615   CREATININE 4.12 (H) 12/15/2021 1615   CALCIUM 9.5 12/15/2021 1615   GFRNONAA 11 (L) 12/15/2021 1615   GFRAA 15 (L) 11/18/2019 0742    COAGS: Lab Results  Component Value Date   INR 1.1 12/15/2021   INR 1.2 06/08/2020   INR 1.2 11/14/2019    ASSESSMENT/PLAN: This is a 66 y.o. female who is currently being treated at [facility] for stroke.  Tnk was administered this afternoon resulting in hemorrhage from her left-sided AV graft.  No bleeding issues prior.   On exam, there was no bleeding.  Recommend using the fistula tomorrow for dialysis.  Should prolonged bleeding occur, we will discuss the role of TDC, fistulogram.    Cassandria Santee MD [facility] [phone] 12/15/2021  9:35 PM

## 2021-12-15 NOTE — ED Triage Notes (Addendum)
Pt brought to ED by [facility] from [facility] with c/o AMS. LKW [id number] by nursing staff at the facility when she arrived back from Short Stay. Pt was supposed to have an endoscopy but because she arrived with snuff in her mouth they canceled the endoscopy. Nursing home reports pt was completely unresponsive. When pt arrives to ED, she has a left sided gaze and her right side is flaccid.

## 2021-12-15 NOTE — ED Notes (Addendum)
This RN & EDP went in to assess fistula bleeding; coban & 2 clamps removed, blood coming from site; clamp reapplied & this RN held pressure until bleeding controlled. CBG assessed, D50 emergently given. EDP at bedside calling vascular surgery for repair of graft; TNK reassessment for 2015 unable to be completed

## 2021-12-15 NOTE — Consult Note (Signed)
Reason for Consult: To manage dialysis and dialysis related needs Referring Physician: Lennan [name] is an 66 y.o. female with past medical history significant for HTN, DM, CAD, chronic pancreatitis, sz disorder and ESRD- TTS at [facility]-  s/p hosp in late August /early September at [facility] for colitis.  She was brought to endoscopy this AM-, sent back to NH when procedure cancelled.   found to have focal neurologic sxms so sent to the ER at [facility].  On arrival to the ER she had a left sided gaze and right sided weakness-  code stroke was called-  neurology made decision to administer TNK-  given at 1349.  Her initial labs showed K of 7.2-  given temporizing measures-  plan was for transfer to [facility] for neuro checks but also needing semi emergent HD.  Not able to tell when last HD treatment was.  After given the TNK-  she was noted to have spontaneous bleeding from her AVG-  now calling vascular to stop it.  With medical treatment her K is down to 6.2  Dialysis Orders: Center: [facility] on TTS -  as of September 2023 EDW 41 Kg HD Bath 2K/3Ca  Time 3:45 Heparin 1000 units. Access LUE AVG BFR 400 DFR 500    Calcitriol 0.25 mcg po/HD Micera 50 mcg IV every 2 weeks  Past Medical History:  Diagnosis Date   Alcohol-induced pancreatitis    CAD (coronary artery disease) 09/17/2020   Chronic diarrhea    Chronic kidney disease    Depression    Diabetes mellitus    fasting blood sugar 110-120s   Dialysis patient ([place])    Diastolic CHF ([place])    DKA (diabetic ketoacidoses)    Gastroparesis    GERD (gastroesophageal reflux disease)    Heart murmur    History of kidney stones    Hyperlipidemia    Hypertension    Hypokalemia    Muscle spasm    Neuropathic pain    Neuropathy    Hx: of   Pyelonephritis    Seizures ([place])    Vitamin B12 deficiency    Vitamin D deficiency     Past Surgical History:  Procedure Laterality Date   AV FISTULA PLACEMENT Left 12/15/2017    Procedure: INSERTION OF ARTERIOVENOUS (AV) GORE-TEX GRAFT ARM;  Surgeon: Rosetta Posner, MD;  Location: [place];  Service: Vascular;  Laterality: Left;   CATARACT EXTRACTION W/PHACO Right 03/14/2015   Procedure: CATARACT EXTRACTION PHACO AND INTRAOCULAR LENS PLACEMENT ([place]);  Surgeon: Baruch Goldmann, MD;  Location: [facility];  Service: Ophthalmology;  Laterality: Right;  CDE:11.13   CATARACT EXTRACTION W/PHACO Left 04/11/2015   Procedure: CATARACT EXTRACTION PHACO AND INTRAOCULAR LENS PLACEMENT LEFT EYE CDE=9.68;  Surgeon: Baruch Goldmann, MD;  Location: [facility];  Service: Ophthalmology;  Laterality: Left;   COLONOSCOPY  02/24/2010   fair prep, limited exam. Two small polyps removed. Tubular adenoma   cystoscopy with ureteral stent  Bilateral 10/07/2017   At [place] CV LINE RIGHT  11/17/2017   IR FLUORO GUIDE CV LINE RIGHT  11/22/2017   IR REMOVAL TUN CV CATH W/O FL  01/26/2018   IR US GUIDE VASC ACCESS RIGHT  11/17/2017   MULTIPLE EXTRACTIONS WITH ALVEOLOPLASTY N/A 06/13/2012   Procedure: MULTIPLE EXTRACION WITH ALVEOLOPLASTY EXTRACT: 18, 19, 20, 21, 22, 24, 25, 27, 28, 29, 30, 31;  Surgeon: Gae Bon, DDS;  Location: [place];  Service:  Oral Surgery;  Laterality: N/A;   ORIF HUMERUS FRACTURE Right 04/20/2019   Procedure: OPEN REDUCTION INTERNAL FIXATION (ORIF) PROXIMAL HUMERUS FRACTURE;  Surgeon: Marchia Bond, MD;  Location: [place];  Service: Orthopedics;  Laterality: Right;   REVISION OF ARTERIOVENOUS GORETEX GRAFT Left 11/04/2021   Procedure: REVISION OF LEFT UPPER ARM ARTERIOVENOUS GORETEX GRAFT WITH REPLACEMENT OF LATERAL ARTERIAL HALF OF GRAFT;  Surgeon: Rosetta Posner, MD;  Location: [facility];  Service: Vascular;  Laterality: Left;   TUBAL LIGATION     URETERAL STENT PLACEMENT  09/2017    Family History  Problem Relation Age of Onset   Diabetes Sister    Chronic Renal Failure Neg Hx    Colon cancer Neg Hx    Colon polyps Neg Hx     Social History:  reports that  she has never smoked. Her smokeless tobacco use includes snuff. She reports that she does not currently use alcohol after a past usage of about 1.0 standard drink of alcohol per week. She reports that she does not use drugs.  Allergies:  Allergies  Allergen Reactions   Aspirin Palpitations and Other (See Comments)    Listed on [facility] 11/12/18   Metformin And Related     Medications: I have reviewed the patient's current medications.  Results for orders placed or performed during the hospital encounter of 12/15/21 (from the past 48 hour(s))  Protime-INR     Status: None   Collection Time: 12/15/21  1:25 PM  Result Value Ref Range   Prothrombin Time 13.7 11.4 - 15.2 seconds   INR 1.1 0.8 - 1.2    Comment: (NOTE) INR goal varies based on device and disease states. Performed at [facility], [address], [place], [place] [address]   APTT     Status: None   Collection Time: 12/15/21  1:25 PM  Result Value Ref Range   aPTT 28 24 - 36 seconds    Comment: Performed at [facility], [address] [place], [place] [address]  CBC     Status: Abnormal   Collection Time: 12/15/21  1:25 PM  Result Value Ref Range   WBC 3.5 (L) 4.0 - 10.5 K/uL   RBC 3.90 3.87 - 5.11 MIL/uL   Hemoglobin 12.3 12.0 - 15.0 g/dL   HCT 39.0 36.0 - 46.0 %   MCV 100.0 80.0 - 100.0 fL   MCH 31.5 26.0 - 34.0 pg   MCHC 31.5 30.0 - 36.0 g/dL   RDW 13.8 11.5 - 15.5 %   Platelets 112 (L) 150 - 400 K/uL   nRBC 0.0 0.0 - 0.2 %    Comment: Performed at [facility], [address] [place], [place] [address]  Differential     Status: None   Collection Time: 12/15/21  1:25 PM  Result Value Ref Range   Neutrophils Relative % 56 %   Neutro Abs 2.0 1.7 - 7.7 K/uL   Lymphocytes Relative 27 %   Lymphs Abs 0.9 0.7 - 4.0 K/uL   Monocytes Relative 12 %   Monocytes Absolute 0.4 0.1 - 1.0 K/uL   Eosinophils Relative 4 %   Eosinophils Absolute 0.1 0.0 - 0.5 K/uL   Basophils Relative 1 %   Basophils Absolute 0.0 0.0 - 0.1 K/uL    Immature Granulocytes 0 %   Abs Immature Granulocytes 0.01 0.00 - 0.07 K/uL    Comment: Performed at [facility] [place] [facility], [address] [place], [place] [address]  Comprehensive metabolic panel  Status: Abnormal   Collection Time: 12/15/21  1:25 PM  Result Value Ref Range   Sodium 123 (L) 135 - 145 mmol/L   Potassium 7.2 (HH) 3.5 - 5.1 mmol/L    Comment: CRITICAL RESULT CALLED TO, READ BACK BY AND VERIFIED WITH: M DOSS RN [id number] [phone] K FORSYTH NO VISIBLE HEMOLYSIS    Chloride 89 (L) 98 - 111 mmol/L   CO2 21 (L) 22 - 32 mmol/L   Glucose, Bld 131 (H) 70 - 99 mg/dL    Comment: Glucose reference range applies only to samples taken after fasting for at least 8 hours.   BUN 55 (H) 8 - 23 mg/dL   Creatinine, Ser 3.79 (H) 0.44 - 1.00 mg/dL   Calcium 9.8 8.9 - 10.3 mg/dL   Total Protein 9.0 (H) 6.5 - 8.1 g/dL   Albumin 4.4 3.5 - 5.0 g/dL   AST 41 15 - 41 U/L   ALT 45 (H) 0 - 44 U/L   Alkaline Phosphatase 545 (H) 38 - 126 U/L   Total Bilirubin 0.8 0.3 - 1.2 mg/dL   GFR, Estimated 12 (L) >60 mL/min    Comment: (NOTE) Calculated using the CKD-EPI Creatinine Equation (2021)    Anion gap 13 5 - 15    Comment: Performed at [facility], [address], [place], [place] [address]  Ethanol     Status: None   Collection Time: 12/15/21  1:46 PM  Result Value Ref Range   Alcohol, Ethyl (B) <10 <10 mg/dL    Comment: (NOTE) Lowest detectable limit for serum alcohol is 10 mg/dL.  For medical purposes only. Performed at [facility], [address] [place], [place] [address]   Ginger Carne 8, ED     Status: Abnormal   Collection Time: 12/15/21  1:49 PM  Result Value Ref Range   Sodium 121 (L) 135 - 145 mmol/L   Potassium 7.1 (HH) 3.5 - 5.1 mmol/L   Chloride 96 (L) 98 - 111 mmol/L   BUN 50 (H) 8 - 23 mg/dL   Creatinine, Ser 4.40 (H) 0.44 - 1.00 mg/dL   Glucose, Bld 130 (H) 70 - 99 mg/dL    Comment: Glucose reference range applies only to samples taken after fasting for at least 8 hours.    Calcium, Ion 1.16 1.15 - 1.40 mmol/L   TCO2 22 22 - 32 mmol/L   Hemoglobin 15.0 12.0 - 15.0 g/dL   HCT 44.0 36.0 - 46.0 %   Comment NOTIFIED PHYSICIAN   CBG monitoring, ED     Status: Abnormal   Collection Time: 12/15/21  3:53 PM  Result Value Ref Range   Glucose-Capillary 175 (H) 70 - 99 mg/dL    Comment: Glucose reference range applies only to samples taken after fasting for at least 8 hours.  Basic metabolic panel     Status: Abnormal   Collection Time: 12/15/21  4:15 PM  Result Value Ref Range   Sodium 124 (L) 135 - 145 mmol/L   Potassium 6.2 (H) 3.5 - 5.1 mmol/L   Chloride 90 (L) 98 - 111 mmol/L   CO2 23 22 - 32 mmol/L   Glucose, Bld 116 (H) 70 - 99 mg/dL    Comment: Glucose reference range applies only to samples taken after fasting for at least 8 hours.   BUN 55 (H) 8 - 23 mg/dL   Creatinine, Ser 4.12 (H) 0.44 - 1.00 mg/dL   Calcium 9.5 8.9 - 10.3 mg/dL   GFR, Estimated 11 (L) >  60 mL/min    Comment: (NOTE) Calculated using the CKD-EPI Creatinine Equation (2021)    Anion gap 11 5 - 15    Comment: Performed at [facility], [address] [place], [place] [address]  CBG monitoring, ED     Status: Abnormal   Collection Time: 12/15/21  8:03 PM  Result Value Ref Range   Glucose-Capillary 39 (LL) 70 - 99 mg/dL    Comment: Glucose reference range applies only to samples taken after fasting for at least 8 hours.  CBG monitoring, ED     Status: Abnormal   Collection Time: 12/15/21  8:34 PM  Result Value Ref Range   Glucose-Capillary 118 (H) 70 - 99 mg/dL    Comment: Glucose reference range applies only to samples taken after fasting for at least 8 hours.    CT ANGIO HEAD NECK W WO CM W PERF (CODE STROKE)  Result Date: 12/15/2021 CLINICAL DATA:  Neuro deficit, acute, stroke suspected EXAM: CT ANGIOGRAPHY HEAD AND NECK CT PERFUSION BRAIN TECHNIQUE: Multidetector CT imaging of the head and neck was performed using the standard protocol during bolus administration of intravenous  contrast. Multiplanar CT image reconstructions and MIPs were obtained to evaluate the vascular anatomy. Carotid stenosis measurements (when applicable) are obtained utilizing NASCET criteria, using the distal internal carotid diameter as the denominator. Multiphase CT imaging of the brain was performed following IV bolus contrast injection. Subsequent parametric perfusion maps were calculated using RAPID software. RADIATION DOSE REDUCTION: This exam was performed according to the departmental dose-optimization program which includes automated exposure control, adjustment of the mA and/or kV according to patient size and/or use of iterative reconstruction technique. CONTRAST:  170mL OMNIPAQUE IOHEXOL 350 MG/ML SOLN COMPARISON:  None Available. FINDINGS: CTA NECK FINDINGS Aortic arch: Standard branching. Imaged portion shows no evidence of aneurysm or dissection. No significant stenosis of the major arch vessel origins. Right carotid system: No evidence of dissection, stenosis (50% or greater), or occlusion. Left carotid system: No evidence of dissection, stenosis (50% or greater), or occlusion. Vertebral arteries: Right dominant. No evidence of dissection, stenosis (50% or greater), or occlusion. Skeleton: Multilevel degenerative change including central disc protrusion at C3-C4. Other neck: No acute findings. Upper chest: Visualized lung apices are clear. Review of the MIP images confirms the above findings CTA HEAD FINDINGS Anterior circulation: Bilateral intracranial ICAs, MCAs, and ACAs are patent without proximal hemodynamically significant stenosis. Posterior circulation: Bilateral intradural vertebral arteries, basilar artery and bilateral posterior cerebral arteries are patent without proximal hemodynamically significant stenosis. Venous sinuses: As permitted by contrast timing, patent. CT Brain Perfusion Findings: CBF (<30%) Volume: 75mL Perfusion (Tmax>6.0s) volume: 78mL Mismatch Volume: 40mL Infarction  Location:None IMPRESSION: 1. No emergent large vessel occlusion or proximal hemodynamically significant stenosis. 2. Normal CT perfusion. Findings discussed with Dr. Leonel Ramsay via telephone 2:17 p.m. Electronically Signed   By: Margaretha Sheffield M.D.   On: 12/15/2021 14:23   CT HEAD CODE STROKE WO CONTRAST  Result Date: 12/15/2021 CLINICAL DATA:  Code stroke. Neuro deficit, acute, stroke suspected a series call [phone] EXAM: CT HEAD WITHOUT CONTRAST TECHNIQUE: Contiguous axial images were obtained from the base of the skull through the vertex without intravenous contrast. RADIATION DOSE REDUCTION: This exam was performed according to the departmental dose-optimization program which includes automated exposure control, adjustment of the mA and/or kV according to patient size and/or use of iterative reconstruction technique. COMPARISON:  CT head 10/12/2021. FINDINGS: Brain: No evidence of acute infarction, hemorrhage, hydrocephalus, extra-axial collection or mass lesion/mass effect.  Patchy white matter hypodensities, nonspecific but compatible with chronic microvascular ischemic disease. Vascular: No hyperdense vessel identified. Skull: No acute fracture. Sinuses/Orbits: Visualized sinuses are clear. No acute orbital findings. Other: No mastoid effusions. ASPECTS [facility] Stroke Program Early CT Score) total score (0-10 with 10 being normal): 10. IMPRESSION: 1. No evidence of acute intracranial abnormality. 2. ASPECTS is 10. Code stroke imaging results were communicated on 12/15/2021 at 1:36 pm to provider Sharlett Iles via telephone, who verbally acknowledged these results. Electronically Signed   By: Margaretha Sheffield M.D.   On: 12/15/2021 13:37    ROS: really unable to obtain Blood pressure (!) 154/80, pulse 80, temperature 98 F (36.7 C), resp. rate (!) 21, weight 49.4 kg, SpO2 99 %. General appearance: appears older than stated age, mild distress, and slowed mentation Eyes: conjunctivae/corneas  clear. PERRL, EOM's intact. Fundi benign., lateral gaze Resp: diminished breath sounds bilaterally Cardio: regular rate and rhythm, S1, S2 normal, no murmur, click, rub or gallop GI: soft, non-tender; bowel sounds normal; no masses,  no organomegaly Extremities: edema 1+ Neurologic: Motor: as noted-  lateral gaze and right sided weakness Left AVG-  clamps in place-  vascular to see  Assessment/Plan: 66 year old BF with multiple medical issues including ESRD-  now admitted with code stroke s/p TNK 1 new CVA- with lateral gaze and right sided weakness-  s/p TNK per neurology-  neuro checks ongoing  2 ESRD: normally TTS at [facility]- unclear when had last HD.  In the ED potassium was found to be 7.2-  got calcium/insulin/d50/bicarb/albuterol and most recent K is 6.2.  Had planned on HD tonight but now with complication of bleeding from AVG s/p TNK-  really not comfortable using AVG. Have talked to vascular-  they think it would be OK to attempt to use AVG in the AM-  then if it bleeds or there is issue- they will be primed to do a shuntogram and maybe place a temporary line 3 Hypertension: is not an issue right now-  likely would prefer permissive hypertension  4. Anemia of ESRD: hgb actually looks good -  anticipate it will go down with time  5. Metabolic Bone Disease: continue calcitriol , resume auryxia once eating  6.  Bleeding AVG -  as above  7. Hyperkalemia-  has responded to short term treatment and rectal kaexylate-  for recheck tonight-  cont with medicinal treatment-  hoping for HD early in the AM    [place] 12/15/2021, 9:01 PM

## 2021-12-15 NOTE — Progress Notes (Signed)
EEG complete - results pending 

## 2021-12-15 NOTE — Consult Note (Addendum)
[facility] Telemedicine Consult   Requesting Provider: Tonette Bihari Consult Participants: Bedside nurses, Ova Freshwater, PA   This consult was provided via telemedicine with 2-way video and audio communication. The patient/family was informed that care would be provided in this way and agreed to receive care in this manner.    Chief Complaint: In bed,   HPI: 66 year old female with a history of diabetes, CHF, hypertension, hyperlipidemia, neuropathy, seizures who was scheduled for colonoscopy today.  They found her to have a "mouthful of snuff" and therefore did not do the procedure.  She was brought back to her nursing home where she is currently undergoing rehabilitation and was normal on arrival.  Last known well was reported at 1130 and then she was subsequently found with right-sided weakness and leftward gaze.  No signs of seizure activity.  She was brought in to [facility] as a code stroke and on my exam, she had right hemiplegia with left-sided gaze.  There was some concern for heme positive stools with acute blood loss previously, but this was more than 3 weeks ago and her current hemoglobin is 15.  She has not had any major surgeries within the past 2 weeks, and is not on any anticoagulants.  I discussed the risks, benefits, and alternatives of IV tenecteplase with the son.  Advised him that given the degree of symptoms that she was exhibiting I would strongly recommend going ahead with tenecteplase.  He agreed with proceeding.  LKW: 11:30 AM tpa given?:  Yes IR Thrombectomy? No lvo Modified Rankin Scale: 4-Needs assistance to walk and tend to bodily needs Time of teleneurologist evaluation: 13:30  Exam: Vitals:   12/15/21 1325  BP: (!) 189/98  Pulse: 65  SpO2: 98%    General: In bed, NAD  1A: Level of Consciousness - 0 1B: Ask Month and Age - 2 1C: 'Blink Eyes' & 'Squeeze Hands' - 2 2: Test Horizontal Extraocular Movements - 2 3: Test Visual Fields - 2 4: Test  Facial Palsy - 1 5A: Test Left Arm Motor Drift - 1 5B: Test Right Arm Motor Drift - 4 6A: Test Left Leg Motor Drift - 2 6B: Test Right Leg Motor Drift - 0 7: Test Limb Ataxia - 0 8: Test Sensation - 2 9: Test Language/Aphasia- 3 10: Test Dysarthria - 2 11: Test Extinction/Inattention - 0 NIHSS score: 22   Imaging Reviewed: CT head - negative.   Labs reviewed in epic and pertinent values follow: Results for orders placed or performed during the hospital encounter of 12/15/21 (from the past 24 hour(s))  I-stat chem 8, ED     Status: Abnormal   Collection Time: 12/15/21  1:49 PM  Result Value Ref Range   Sodium 121 (L) 135 - 145 mmol/L   Potassium 7.1 (HH) 3.5 - 5.1 mmol/L   Chloride 96 (L) 98 - 111 mmol/L   BUN 50 (H) 8 - 23 mg/dL   Creatinine, Ser 4.40 (H) 0.44 - 1.00 mg/dL   Glucose, Bld 130 (H) 70 - 99 mg/dL   Calcium, Ion 1.16 1.15 - 1.40 mmol/L   TCO2 22 22 - 32 mmol/L   Hemoglobin 15.0 12.0 - 15.0 g/dL   HCT 44.0 36.0 - 46.0 %   Comment NOTIFIED PHYSICIAN       Assessment: 66 yo F with right hemiplegia with leftward gaze and aphasia.  This is most consistent with a large vessel occlusion.  She does have some electrolyte abnormalities, which could be slightly confounding the  picture, but I do not think that her hyponatremia would be at all likely to give her the focal findings that we are seeing.  Given the acute presentation, and the fact that she has extensive risk factors for acute ischemic stroke I did favor proceeding with tenecteplase given that she did not have any contraindicating conditions.  Her modified Rankin is a four, because she has become deconditioned with lower extremity weakness, however she is cognitively intact and therefore if she is a candidate for thrombectomy, I do think it would be reasonable to discuss this with family and consider proceeding.  Recommendations:  1) IV tenecteplase 0.25 mg/kg 2) stat CT angiogram head and neck with consideration  of thrombectomy if positive. 3) no antiplatelets or anticoagulants for 24 hours 4) maintain BP less than 180/105 for 24 hours 5) correction of electrolytes per ED/CCM 6) transfer to [facility] for post-TNK monitoring 7) continue home keppra. , if MRI is negative, needs EEG 8) Stroke team to follow after arrival to [facility]  This patient is critically ill and at significant risk of neurological worsening, death and care requires constant monitoring of vital signs, hemodynamics,respiratory and cardiac monitoring, neurological assessment, discussion with family, other specialists and medical decision making of high complexity. I spent 45 minutes of neurocritical care time  in the care of  this patient. This was time spent independent of any time provided by nurse practitioner or PA.  Roland Rack, MD [facility] [phone]  If 7pm- 7am, please page neurology on call as listed in [place]. 12/15/2021  2:06 PM

## 2021-12-15 NOTE — Progress Notes (Signed)
eLink Physician-Brief Progress Note Patient Name: Erica [name] DOB: 1955/11/20 MRN: [id number]   Date of Service  12/15/2021  HPI/Events of Note  Hypoglycemia - Blood glucose = 39 --> given D50 --> 118 --> 71.  eICU Interventions  Plan: D10W to run IV at 40 mL/hour.      Intervention Category Major Interventions: Other:  Lysle Dingwall 12/15/2021, 9:57 PM

## 2021-12-15 NOTE — ED Provider Notes (Signed)
I discussed case with Dr. Virl Cagey.  He recommends releasing the clamp is been about the 3- 4 hours and seeing if she is still bleeding.  Unfortunately when removing the proximal clamp it immediately squirted blood.  The distal clamp was removed with no bleeding.  Pressure was applied.  Dr. Virl Cagey made aware and will come help treat.  She also was hypoglycemic into the 30s.  Given D50.   Sherwood Gambler, MD 12/15/21 2229

## 2021-12-15 NOTE — ED Notes (Signed)
Report received from previous RN. This RN went in to assess pt & found pt covered in multiple blankets & sheets, tangled in monitoring cords, with foot hanging out in between bed rungs. This RN corrected aforementioned issues, ensured clean linens were on & under pt, & performed NIH/ post-TNK assessment. ULE assessed, no bleeding at this time, clamps in place. Pt maintaining airway, in NAD at this time.

## 2021-12-15 NOTE — Progress Notes (Addendum)
PCCM Interval Progress Note  Patient previously admitted by PCCM (Dr. Elsworth Soho) at [facility]; ED-to-ED transfer initiated in the setting of need for higher level of care (Neurology/Stroke services, HD).  Briefly, patient is a 66 year old woman with multiple comorbidities (including HTN, HLD, CHF, T2DM with peripheral neuropathy, ESRD on HD (TTS via LUE AVF) who presented to [facility] from [facility]F for scheduled EGD; this was cancelled as chewing tobacco was found in patient's mouth at the time of assessment. Shortly after this, patient had L-sided gaze deviation with concomitant R-sided weakness and concern for stroke. Code Stroke was called. CT Head NAICA, TNK was administered at 1349. CTA Head/Neck negative for LVO. While in ED, post-TNK administration patient's LUE AVF dialysis access began to bleed. Pressure was applied and clamps placed. Patient was transferred to [facility] for higher level of care.   PCCM ground team called to ED to reassess patient after [facility] ED arrival. On my arrival, patient reportedly had expanding hematoma at LUE AVF access site. [facility] ED RN applied circumferential pressure which allowed for hemostasis. Dr. Virl Cagey ([facility]) was present at bedside; fortunately no urgent surgical intervention was needed. Ace bandage x 2 were applied for compression and the access site remained hemostatic. Due to recent TNK administration and active bleeding, temporary dialysis access was recommended (discussed with son, [name], and consent obtained via phone - please see procedure note dated 10/31AM).  Physical Examination: General: Chronically ill-appearing middle-aged woman in NAD. Appears mildly uncomfortable. Minimally responsive. HEENT: Carpenter/AT, anicteric sclera, PERRL 2.64mm, moist mucous membranes. Leftward gaze deviation. Neuro: Stuporous.  Opens eyes to voice/name, otherwise nonresponsive. Withdraws to pain in all 4 extremities, L > R. Not following commands. Unilateral R-sided neglect noted. Generalized  weakness.+Corneal, +Cough, and +Gag. Does not blink to threat. CV: RRR, no m/g/r. PULM: Breathing even and unlabored on RA. Lung fields CTAB. GI: Soft, nontender, nondistended. Normoactive bowel sounds. Extremities: No LE edema noted. Skin: Warm/dry, no rashes.  Patient transferred to 3M11. R femoral HD line placed (as above). Nephrology updated re: access and HD orders in place. Last K 7.2, chemically shifted with HD to start ASAP. Patient's son, [name], updated via phone.  The patient is critically ill with multiple organ system failure and requires high complexity decision making for assessment and support, frequent evaluation and titration of therapies, advanced monitoring, review of radiographic studies and interpretation of complex data.   Critical Care Time devoted to patient care services, exclusive of separately billable procedures, described in this note is 37 minutes.  Lestine Mount, PA-C [facility] 12/16/21 1:29 AM  Please see [email or web address] for pager details.  From 7A-7P if no response, please call [phone] After hours, please call ELink [phone]

## 2021-12-15 NOTE — ED Notes (Addendum)
Pt brought in from [facility] after receiving TNK for left side gaze. Has a bleeding shunt left upper arm. Dialysis clamps currently on shunt. From SNF [facility]. Pt originally came in for an endoscopy this AM but unable to complete due to chewing tobacco in mouth. Unable to complete NIH per paramedics due to pt non-verbal and not able to follow instructions.

## 2021-12-15 NOTE — ED Notes (Addendum)
Pati Gallo, PA at bedside attempted IV [place].

## 2021-12-15 NOTE — ED Notes (Signed)
Beeped and Called CT and Lab for CODE STROKE

## 2021-12-16 ENCOUNTER — Telehealth: Payer: Self-pay | Admitting: Internal Medicine

## 2021-12-16 ENCOUNTER — Other Ambulatory Visit (HOSPITAL_COMMUNITY): Payer: Medicare Other

## 2021-12-16 ENCOUNTER — Inpatient Hospital Stay (HOSPITAL_COMMUNITY): Payer: Medicare Other

## 2021-12-16 DIAGNOSIS — Z87898 Personal history of other specified conditions: Secondary | ICD-10-CM | POA: Diagnosis not present

## 2021-12-16 DIAGNOSIS — R4182 Altered mental status, unspecified: Secondary | ICD-10-CM | POA: Diagnosis not present

## 2021-12-16 DIAGNOSIS — R569 Unspecified convulsions: Secondary | ICD-10-CM

## 2021-12-16 DIAGNOSIS — N186 End stage renal disease: Secondary | ICD-10-CM

## 2021-12-16 LAB — CBC
HCT: 29.2 % — ABNORMAL LOW (ref 36.0–46.0)
HCT: 28.9 % — ABNORMAL LOW (ref 36.0–46.0)
Hemoglobin: 9.3 g/dL — ABNORMAL LOW (ref 12.0–15.0)
Hemoglobin: 9.5 g/dL — ABNORMAL LOW (ref 12.0–15.0)
MCH: 32 pg (ref 26.0–34.0)
MCH: 32.1 pg (ref 26.0–34.0)
MCHC: 31.8 g/dL (ref 30.0–36.0)
MCHC: 32.9 g/dL (ref 30.0–36.0)
MCV: 100.3 fL — ABNORMAL HIGH (ref 80.0–100.0)
MCV: 97.6 fL (ref 80.0–100.0)
Platelets: 124 10*3/uL — ABNORMAL LOW (ref 150–400)
Platelets: 130 10*3/uL — ABNORMAL LOW (ref 150–400)
RBC: 2.91 MIL/uL — ABNORMAL LOW (ref 3.87–5.11)
RBC: 2.96 MIL/uL — ABNORMAL LOW (ref 3.87–5.11)
RDW: 13.7 % (ref 11.5–15.5)
RDW: 13.8 % (ref 11.5–15.5)
WBC: 8.8 10*3/uL (ref 4.0–10.5)
WBC: 9 10*3/uL (ref 4.0–10.5)
nRBC: 0 % (ref 0.0–0.2)
nRBC: 0 % (ref 0.0–0.2)

## 2021-12-16 LAB — BASIC METABOLIC PANEL
Anion gap: 17 — ABNORMAL HIGH (ref 5–15)
BUN: 17 mg/dL (ref 8–23)
CO2: 24 mmol/L (ref 22–32)
Calcium: 9.3 mg/dL (ref 8.9–10.3)
Chloride: 91 mmol/L — ABNORMAL LOW (ref 98–111)
Creatinine, Ser: 2.11 mg/dL — ABNORMAL HIGH (ref 0.44–1.00)
GFR, Estimated: 25 mL/min — ABNORMAL LOW (ref >60)
Glucose, Bld: 120 mg/dL — ABNORMAL HIGH (ref 70–99)
Potassium: 3.6 mmol/L (ref 3.5–5.1)
Sodium: 132 mmol/L — ABNORMAL LOW (ref 135–145)

## 2021-12-16 LAB — HEPATITIS B SURFACE ANTIBODY, QUANTITATIVE: Hep B S AB Quant (Post): 221.8 m[IU]/mL (ref >9.9)

## 2021-12-16 LAB — RENAL FUNCTION PANEL
Albumin: 3.3 g/dL — ABNORMAL LOW (ref 3.5–5.0)
Anion gap: 18 — ABNORMAL HIGH (ref 5–15)
BUN: 57 mg/dL — ABNORMAL HIGH (ref 8–23)
CO2: 21 mmol/L — ABNORMAL LOW (ref 22–32)
Calcium: 10.1 mg/dL (ref 8.9–10.3)
Chloride: 89 mmol/L — ABNORMAL LOW (ref 98–111)
Creatinine, Ser: 4.45 mg/dL — ABNORMAL HIGH (ref 0.44–1.00)
GFR, Estimated: 10 mL/min — ABNORMAL LOW (ref >60)
Glucose, Bld: 107 mg/dL — ABNORMAL HIGH (ref 70–99)
Phosphorus: 4 mg/dL (ref 2.5–4.6)
Potassium: 6.1 mmol/L — ABNORMAL HIGH (ref 3.5–5.1)
Sodium: 128 mmol/L — ABNORMAL LOW (ref 135–145)

## 2021-12-16 LAB — GLUCOSE, CAPILLARY
Glucose-Capillary: 86 mg/dL (ref 70–99)
Glucose-Capillary: 104 mg/dL — ABNORMAL HIGH (ref 70–99)
Glucose-Capillary: 154 mg/dL — ABNORMAL HIGH (ref 70–99)
Glucose-Capillary: 154 mg/dL — ABNORMAL HIGH (ref 70–99)
Glucose-Capillary: 54 mg/dL — ABNORMAL LOW (ref 70–99)
Glucose-Capillary: 128 mg/dL — ABNORMAL HIGH (ref 70–99)
Glucose-Capillary: 149 mg/dL — ABNORMAL HIGH (ref 70–99)

## 2021-12-16 MED ORDER — SODIUM CHLORIDE 0.9% FLUSH
10.0000 mL | Freq: Two times a day (BID) | INTRAVENOUS | Status: DC
  Administered 2021-12-16 – 2021-12-31 (×23): 10 mL

## 2021-12-16 MED ORDER — SODIUM CHLORIDE 0.9% FLUSH: 10.0000 mL | INTRAVENOUS | Status: DC | PRN

## 2021-12-16 MED ORDER — HEPARIN SODIUM (PORCINE) 5000 UNIT/ML IJ SOLN
5000.0000 [IU] | Freq: Two times a day (BID) | INTRAMUSCULAR | Status: DC
  Administered 2021-12-16 – 2021-12-24 (×16): 5000 [IU] via SUBCUTANEOUS
  Filled 2021-12-16 (×16): qty 1

## 2021-12-16 MED ORDER — ORAL CARE MOUTH RINSE: 15.0000 mL | OROMUCOSAL | Status: DC | PRN

## 2021-12-16 MED ORDER — ALTEPLASE 2 MG IJ SOLR: 2.0000 mg | Freq: Once | INTRAMUSCULAR | Status: DC | PRN

## 2021-12-16 MED ORDER — PENTAFLUOROPROP-TETRAFLUOROETH EX AERO: 1.0000 | INHALATION_SPRAY | CUTANEOUS | Status: DC | PRN

## 2021-12-16 MED ORDER — DEXTROSE 10 % IV SOLN: INTRAVENOUS | Status: DC

## 2021-12-16 MED ORDER — LIDOCAINE-PRILOCAINE 2.5-2.5 % EX CREA: 1.0000 | TOPICAL_CREAM | CUTANEOUS | Status: DC | PRN

## 2021-12-16 MED ORDER — DEXTROSE 50 % IV SOLN
INTRAVENOUS | Status: AC
  Administered 2021-12-16: 25 mL via INTRAVENOUS
  Filled 2021-12-16: qty 50

## 2021-12-16 MED ORDER — LIDOCAINE HCL (PF) 1 % IJ SOLN: 5.0000 mL | INTRAMUSCULAR | Status: DC | PRN

## 2021-12-16 MED ORDER — ANTICOAGULANT SODIUM CITRATE 4% (200MG/5ML) IV SOLN: 5.0000 mL | Status: DC | PRN

## 2021-12-16 MED ORDER — HEPARIN SODIUM (PORCINE) 1000 UNIT/ML DIALYSIS: 1000.0000 [IU] | INTRAMUSCULAR | Status: DC | PRN

## 2021-12-16 MED ORDER — SODIUM CHLORIDE 0.9 % IV SOLN: INTRAVENOUS | Status: DC | PRN

## 2021-12-16 NOTE — Procedures (Signed)
Patient Name: Erica [name]  MRN: [id number]  Epilepsy Attending: [name]  Referring Physician/Provider: Rigoberto Noel, MD  Date: 12/16/2021 Duration: 23.25 mins  Patient history: 66 yo F with right hemiplegia with leftward gaze and aphasia.EEG to evaluate for seizure  Level of alertness: lethargic   AEDs during EEG study: LEV  Technical aspects: This EEG study was done with scalp electrodes positioned according to the 10-20 International system of electrode placement. Electrical activity was reviewed with band pass filter of 1-70Hz , sensitivity of 7 uV/mm, display speed of 72mm/sec with a 60Hz  notched filter applied as appropriate. EEG data were recorded continuously and digitally stored.  Video monitoring was available and reviewed as appropriate.  Description: EEG showed continuous generalized 3 to 6 Hz theta-delta slowing. Hyperventilation and photic stimulation were not performed.     Of note, study was technically difficult due to significant myogenic artifact  ABNORMALITY - Continuous slow, generalized  IMPRESSION: This study is suggestive of moderate to severe diffuse encephalopathy, nonspecific etiology. No seizures or epileptiform discharges were seen throughout the recording.  Jamesen Stahnke Barbra Sarks

## 2021-12-16 NOTE — Progress Notes (Signed)
Initial Nutrition Assessment  DOCUMENTATION CODES:   Severe malnutrition in context of chronic illness  INTERVENTION:   Initiate tube feeds via Cortrak: - Start Osmolite 1.2 @ 20 ml/hr and advance by 10 ml q 8 hours to goal rate of 55 ml/hr (1320 ml/day)  Tube feeding regimen at goal rate provides 1584 kcal, 73 grams of protein, and 1082 ml of H2O.   Monitor magnesium, potassium, and phosphorus BID for at least 3 days, MD to replete as needed, as pt is at risk for refeeding syndrome given severe malnutrition.  - MVI with minerals daily per tube  - Checking zinc, thiamine, vitamin C, vitamin A, vitamin D, vitamin E, and vitamin K labs  NUTRITION DIAGNOSIS:   Severe Malnutrition related to chronic illness (ESRD, chronic pancreatitis, CHF) as evidenced by severe fat depletion, severe muscle depletion.  GOAL:   Patient will meet greater than or equal to 90% of their needs  MONITOR:   Diet advancement, Labs, Weight trends, TF tolerance, I & O's  REASON FOR ASSESSMENT:   Consult Enteral/tube feeding initiation and management  ASSESSMENT:   66 year old female who presented to the ED from SNF on 10/30 as a Code Stroke. PMH of ESRD on HD, gastroparesis, chronic pancreatitis, T2DM, CHF, HTN, HLD, neuropathy, seizures. Pt received TNK.  11/01 - Cortrak placed (tip gastric)  Discussed pt with RN and during ICU rounds. MRI brain without any evidence of acute stroke. Per RN, pt likely with seizures. Consult received for enteral nutrition initiation and management. Last HD on 10/31 with 200 ml net UF.  Pt unable to provide diet or weight history at this time. Current weight is above EDW. Reviewed weight history in chart. Pt with a 2.3 kg weight loss since 09/22/21. This is a 4.8% weight loss in 3 months which is not quite clinically significant for timeframe. However, pt's current weight is above EDW and pt meets criteria for severe malnutrition based on NFPE.  Reviewed RD note from  previous admission in August 2023 when pt was admitted with colitis. Pt with a history of vitamin deficiencies per RD note. Noted vitamin B12 was 1598 (H) and folate was >40.0 (WNL) on 10/13/21. Pt at high risk for vitamin/mineral deficiencies given severe malnutrition and hx of chronic pancreatitis. RD to check vitamin/mineral labs and CRP.  Will start tube feeds at trickle rate and slowly advance to goal as pt is at risk of refeeding syndrome.  Admit weight: 49.4 kg Current weight: 45.2 kg EDW: 41 kg  Medications reviewed and include: SSI q 4 hours, IV keppra  Labs reviewed: sodium 133, potassium 2.8 (pt received IV KCl 10 mEq x 2), creatinine 3.05, hemoglobin 9.5, platelets 130 CBG's: 54-172 x 24 hours  Vitamin/Mineral Profile: Thiamine B1: pending Vitamin A: pending Vitamin D: pending Vitamin E: pending Vitamin K: pending Vitamin C: pending Zinc: pending CRP: pending  Stool: 280 ml via rectal tube + 3 unmeasured occurrences x 24 hours I/O's: -650 ml since admit  NUTRITION - FOCUSED PHYSICAL EXAM:  Flowsheet Row Most Recent Value  Orbital Region Severe depletion  Upper Arm Region Severe depletion  Thoracic and Lumbar Region Moderate depletion  Buccal Region Severe depletion  Temple Region Severe depletion  Clavicle Bone Region Severe depletion  Clavicle and Acromion Bone Region Severe depletion  Scapular Bone Region Severe depletion  Dorsal Hand Severe depletion  Patellar Region Severe depletion  Anterior Thigh Region Severe depletion  Posterior Calf Region Severe depletion  Edema (RD Assessment) None  Hair Reviewed  Eyes Reviewed  Mouth Reviewed  Skin Reviewed  Nails Reviewed       Diet Order:   Diet Order             Diet NPO time specified  Diet effective now                   EDUCATION NEEDS:   Not appropriate for education at this time  Skin:  Skin Assessment: Reviewed RN Assessment  Last BM:  12/17/21 rectal tube  Height:   Ht Readings  from Last 1 Encounters:  12/15/21 4\' 8"  (1.422 m)    Weight:   Wt Readings from Last 1 Encounters:  12/17/21 45.2 kg    BMI:  Body mass index is 22.34 kg/m.  Estimated Nutritional Needs:   Kcal:  1500-1700  Protein:  70-85 grams  Fluid:  >1.5 L    Gustavus Bryant, MS, RD, LDN Inpatient Clinical Dietitian Please see AMiON for contact information.

## 2021-12-16 NOTE — Progress Notes (Addendum)
STROKE TEAM PROGRESS NOTE   INTERVAL HISTORY Patient seen in bed s/p MRI. MRI negative for stroke but left cortical ribboning concerning for seizure on my read. Patient does not follow commands, does not talk. Left gaze persist. Moves left side slightly but right side does not respond to noxious.  Vitals:   12/16/21 0600 12/16/21 0700 12/16/21 0732 12/16/21 0800  BP: (!) 145/57 (!) 136/49 (!) 152/54 (!) 165/49  Pulse: 95 99 (!) 101 (!) 102  Resp: 18 17 19  (!) 21  Temp:   99.5 F (37.5 C)   TempSrc:   Axillary   SpO2: 100% 100% 100% 100%  Weight:       CBC:  Recent Labs  Lab 12/15/21 1325 12/15/21 1349 12/16/21 0213  WBC 3.5*  --  8.8  NEUTROABS 2.0  --   --   HGB 12.3 15.0 9.3*  HCT 39.0 44.0 29.2*  MCV 100.0  --  100.3*  PLT 112*  --  947*   Basic Metabolic Panel:  Recent Labs  Lab 12/15/21 2140 12/16/21 0212  NA 126* 128*  K 7.2* 6.1*  CL 88* 89*  CO2 20* 21*  GLUCOSE 71 107*  BUN 55* 57*  CREATININE 4.16* 4.45*  CALCIUM 10.0 10.1  PHOS 4.3 4.0   Lipid Panel: No results for input(s): "CHOL", "TRIG", "HDL", "CHOLHDL", "VLDL", "LDLCALC" in the last 168 hours. HgbA1c: No results for input(s): "HGBA1C" in the last 168 hours. Urine Drug Screen: No results for input(s): "LABOPIA", "COCAINSCRNUR", "LABBENZ", "AMPHETMU", "THCU", "LABBARB" in the last 168 hours.  Alcohol Level  Recent Labs  Lab 12/15/21 1346  ETH <10    IMAGING past 24 hours EEG adult  Result Date: 12/16/2021 Erica Havens, MD     12/16/2021  8:42 AM Patient Name: Erica [name] MRN: [id number] Epilepsy Attending: Lora [name] Referring Physician/Provider: Rigoberto Noel, MD Date: 12/16/2021 Duration: 23.25 mins Patient history: 66 yo F with right hemiplegia with leftward gaze and aphasia.EEG to evaluate for seizure Level of alertness: lethargic AEDs during EEG study: LEV Technical aspects: This EEG study was done with scalp electrodes positioned according to the 10-20 International  system of electrode placement. Electrical activity was reviewed with band pass filter of 1-70Hz , sensitivity of 7 uV/mm, display speed of 53mm/sec with a 60Hz  notched filter applied as appropriate. EEG data were recorded continuously and digitally stored.  Video monitoring was available and reviewed as appropriate. Description: EEG showed continuous generalized 3 to 6 Hz theta-delta slowing. Hyperventilation and photic stimulation were not performed.   Of note, study was technically difficult due to significant myogenic artifact ABNORMALITY - Continuous slow, generalized IMPRESSION: This study is suggestive of moderate to severe diffuse encephalopathy, nonspecific etiology. No seizures or epileptiform discharges were seen throughout the recording. Erica [name]   CT ANGIO HEAD NECK W WO CM W PERF (CODE STROKE)  Result Date: 12/15/2021 CLINICAL DATA:  Neuro deficit, acute, stroke suspected EXAM: CT ANGIOGRAPHY HEAD AND NECK CT PERFUSION BRAIN TECHNIQUE: Multidetector CT imaging of the head and neck was performed using the standard protocol during bolus administration of intravenous contrast. Multiplanar CT image reconstructions and MIPs were obtained to evaluate the vascular anatomy. Carotid stenosis measurements (when applicable) are obtained utilizing NASCET criteria, using the distal internal carotid diameter as the denominator. Multiphase CT imaging of the brain was performed following IV bolus contrast injection. Subsequent parametric perfusion maps were calculated using RAPID software. RADIATION DOSE REDUCTION: This exam was performed according to the departmental  dose-optimization program which includes automated exposure control, adjustment of the mA and/or kV according to patient size and/or use of iterative reconstruction technique. CONTRAST:  139mL OMNIPAQUE IOHEXOL 350 MG/ML SOLN COMPARISON:  None Available. FINDINGS: CTA NECK FINDINGS Aortic arch: Standard branching. Imaged portion shows no  evidence of aneurysm or dissection. No significant stenosis of the major arch vessel origins. Right carotid system: No evidence of dissection, stenosis (50% or greater), or occlusion. Left carotid system: No evidence of dissection, stenosis (50% or greater), or occlusion. Vertebral arteries: Right dominant. No evidence of dissection, stenosis (50% or greater), or occlusion. Skeleton: Multilevel degenerative change including central disc protrusion at C3-C4. Other neck: No acute findings. Upper chest: Visualized lung apices are clear. Review of the MIP images confirms the above findings CTA HEAD FINDINGS Anterior circulation: Bilateral intracranial ICAs, MCAs, and ACAs are patent without proximal hemodynamically significant stenosis. Posterior circulation: Bilateral intradural vertebral arteries, basilar artery and bilateral posterior cerebral arteries are patent without proximal hemodynamically significant stenosis. Venous sinuses: As permitted by contrast timing, patent. CT Brain Perfusion Findings: CBF (<30%) Volume: 56mL Perfusion (Tmax>6.0s) volume: 37mL Mismatch Volume: 46mL Infarction Location:None IMPRESSION: 1. No emergent large vessel occlusion or proximal hemodynamically significant stenosis. 2. Normal CT perfusion. Findings discussed with Dr. Leonel Ramsay via telephone 2:17 p.m. Electronically Signed   By: Margaretha Sheffield M.D.   On: 12/15/2021 14:23   CT HEAD CODE STROKE WO CONTRAST  Result Date: 12/15/2021 CLINICAL DATA:  Code stroke. Neuro deficit, acute, stroke suspected a series call [phone] EXAM: CT HEAD WITHOUT CONTRAST TECHNIQUE: Contiguous axial images were obtained from the base of the skull through the vertex without intravenous contrast. RADIATION DOSE REDUCTION: This exam was performed according to the departmental dose-optimization program which includes automated exposure control, adjustment of the mA and/or kV according to patient size and/or use of iterative reconstruction  technique. COMPARISON:  CT head 10/12/2021. FINDINGS: Brain: No evidence of acute infarction, hemorrhage, hydrocephalus, extra-axial collection or mass lesion/mass effect. Patchy white matter hypodensities, nonspecific but compatible with chronic microvascular ischemic disease. Vascular: No hyperdense vessel identified. Skull: No acute fracture. Sinuses/Orbits: Visualized sinuses are clear. No acute orbital findings. Other: No mastoid effusions. ASPECTS [facility] Stroke Program Early CT Score) total score (0-10 with 10 being normal): 10. IMPRESSION: 1. No evidence of acute intracranial abnormality. 2. ASPECTS is 10. Code stroke imaging results were communicated on 12/15/2021 at 1:36 pm to provider Sharlett Iles via telephone, who verbally acknowledged these results. Electronically Signed   By: Margaretha Sheffield M.D.   On: 12/15/2021 13:37    PHYSICAL EXAM  Physical Exam  Constitutional: Appears deconditioned Eyes: Normal external eye and conjunctiva. HENT: Normocephalic, no lesions, without obvious abnormality.   Musculoskeletal-no joint tenderness, deformity or swelling Cardiovascular: Normal rate and regular rhythm.  Respiratory: Effort normal, non-labored breathing saturations WNL GI: Soft.  No distension. There is no tenderness.  Skin: WDI   Neuro:  Mental Status: Eyes closed, but open on voice. Patient does not follow commands and does not speak.   Cranial Nerves: left gaze, does not cross midline. Blinking to visual threat on the left but not on the right. face appears symmetric Motor/sensory: Able to move left side slightly to noxious stim/ in LUE/ LLE. Purposeful movement noted in left extremities. Patient does not respond to noxious stimulation RUE/RLE.  Sensory: Pinprick and light touch no response on the right Cerebellar: UTA Gait: UTA    ASSESSMENT/PLAN  66 year old female with a history of diabetes, CHF, hypertension, hyperlipidemia, neuropathy, seizures who  was scheduled for  colonoscopy today.  They found her to have a "mouthful of snuff" and therefore did not do the procedure.  She was brought back to her nursing home where she is currently undergoing rehabilitation and was normal on arrival.  Last known well was reported at 1130 and then she was subsequently found with right-sided weakness and leftward gaze.  No signs of seizure activity.  She was brought in to [facility] as a code stroke and on my exam, she had right hemiplegia with left-sided gaze.   There was some concern for heme positive stools with acute blood loss previously, but this was more than 3 weeks ago and her current hemoglobin is 15.  She has not had any major surgeries within the past 2 weeks, and is not on any anticoagulants.   I discussed the risks, benefits, and alternatives of IV tenecteplase with the son.  Advised him that given the degree of symptoms that she was exhibiting I would strongly recommend going ahead with tenecteplase.  He agreed with proceeding.  Possible left brain seizure with prolonged postictal state, ? Infection with fever lowered seizure threshold Code Stroke CT head No evidence of acute intracranial abnormality. ASPECTS is 10. CTA head & neck 1. No emergent large vessel occlusion or proximal hemodynamically significant stenosis. CT perfusion: Normal CT perfusion. MRI no infarct but left brain cortical ribboning concerning for seizure 2D Echo pending LDL 53 HgbA1c 5.5 VTE prophylaxis - heparin subq No antithrombotic prior to admission, now on No antithrombotic given no stroke.  Therapy recommendations:  pending Disposition:  pending   Hx of seizure Admitted in 10/2017 and 10/2018 for seizure/breakthrough seizures At last discharge in 10/2018, she was put on dilantin 150 bid, tompamax 25 bid, and keppra 1g daily with additional 500mg  post dialysis However, current admission home meds only has keppra 500mg  Qhs on the list which confirmed by son [name] over the  phone Currently on keppra 500mg  IV bid EEG showed moderate to severe diffuse encephalopathy, nonspecific etiology. Will discuss with Dr. Hortense Ramal in am  Fever  Tmax 101.8 CXR unremarkable CCM on board  Hypertension Home meds:  carvedilol 25mg , hydralazine Stable Long-term BP goal normotensive  Diabetes type II Controlled Home meds:  insulin aspart HgbA1c 5.5, goal < 7.0 CBGs SSI  Other Stroke Risk Factors Advanced Age >/= 79  ESRD on HD  Hospital day # 1  This patient is critically ill due to likely seizure with prolonged postictal state, history of seizure, fever and at significant risk of neurological worsening, death form status epilepticus, sepsis, respiratory failure. This patient's care requires constant monitoring of vital signs, hemodynamics, respiratory and cardiac monitoring, review of multiple databases, neurological assessment, discussion with family, other specialists and medical decision making of high complexity. I spent 45 minutes of neurocritical care time in the care of this patient. I had long discussion with son over the phone, updated pt current condition, treatment plan and potential prognosis, and answered all the questions.  He expressed understanding and appreciation.   Rosalin Hawking, MD PhD Stroke Neurology 12/16/2021 7:17 PM    To contact Stroke Continuity provider, please refer to [email or web address] After hours, contact General Neurology

## 2021-12-16 NOTE — Progress Notes (Signed)
eLink Physician-Brief Progress Note Patient Name: Erica [name] DOB: 23-Jan-1956 MRN: [id number]   Date of Service  12/16/2021  HPI/Events of Note  Hypoglycemia - Blood glucose = 54. NPO now tube feeding until tomorrow.   eICU Interventions  Plan: D10W IV infusion at 40 mL/hour.     Intervention Category Major Interventions: Other:  Lysle Dingwall 12/16/2021, 7:39 PM

## 2021-12-16 NOTE — Progress Notes (Signed)
  Progress Note    12/16/2021 7:40 AM * No surgery found *  Subjective: no further bleeding overnight   Vitals:   12/16/21 0700 12/16/21 0732  BP: (!) 136/49 (!) 152/54  Pulse: 99 (!) 101  Resp: 17 19  Temp:  99.5 F (37.5 C)  SpO2: 100% 100%   Physical Exam: Cardiac:  regular Lungs:  non labored Extremities:  Left AVG dressings removed.- Dry. No evidence of bleeding. Graft with good thrill Neurologic: lateral gaze, slow mentation, aphasic  CBC    Component Value Date/Time   WBC 8.8 12/16/2021 0213   RBC 2.91 (L) 12/16/2021 0213   HGB 9.3 (L) 12/16/2021 0213   HCT 29.2 (L) 12/16/2021 0213   PLT 124 (L) 12/16/2021 0213   MCV 100.3 (H) 12/16/2021 0213   MCH 32.0 12/16/2021 0213   MCHC 31.8 12/16/2021 0213   RDW 13.7 12/16/2021 0213   LYMPHSABS 0.9 12/15/2021 1325   MONOABS 0.4 12/15/2021 1325   EOSABS 0.1 12/15/2021 1325   BASOSABS 0.0 12/15/2021 1325    BMET    Component Value Date/Time   NA 128 (L) 12/16/2021 0212   K 6.1 (H) 12/16/2021 0212   CL 89 (L) 12/16/2021 0212   CO2 21 (L) 12/16/2021 0212   GLUCOSE 107 (H) 12/16/2021 0212   BUN 57 (H) 12/16/2021 0212   CREATININE 4.45 (H) 12/16/2021 0212   CALCIUM 10.1 12/16/2021 0212   GFRNONAA 10 (L) 12/16/2021 0212   GFRAA 15 (L) 11/18/2019 0742    INR    Component Value Date/Time   INR 1.1 12/15/2021 1325     Intake/Output Summary (Last 24 hours) at 12/16/2021 0740 Last data filed at 12/16/2021 0555 Gross per 24 hour  Intake 223.3 ml  Output 2000 ml  Net -1776.7 ml     Assessment/Plan:  66 y.o. female who had bleeding from her left AV graft after TNK administration for stroke. There was no bleeding on exam yesterday. On re examination today dressings c/d/I. ACE wraps removed and no evidence of any further bleeding. No dressing replaced. Okay to use fistula for dialysis. If any further issues with bleeding may need further evaluation. Currently has femoral temporary catheter to dialyze. Vascular  will remain available as needed   Karoline Caldwell, PA-C [facility] [phone] 12/16/2021 7:40 AM

## 2021-12-16 NOTE — Progress Notes (Signed)
Spoke with [facility] NP and MRI staff. Will get 24 hour post TNK MRI around 1-130 PM this afternoon. MRI staff stated they will call and send transport

## 2021-12-16 NOTE — Progress Notes (Signed)
[name] Progress Note   Assessment/ Plan:   Assessment/Plan: 66 year old BF with multiple medical issues including ESRD-  now admitted with code stroke s/p TNK 1 new CVA- with lateral gaze and right sided weakness-  s/p TNK per neurology-  neuro checks ongoing  2 ESRD: normally TTS at [facility]- unclear when had last HD.  In the ED potassium was found to be 7.2-  got calcium/insulin/d50/bicarb/albuterol and most recent K is 6.2.   - HD overnight, will do HD tomorrow through AVG - VVS had been consulted, if further issues on HD tomorrow will do shuntogram 3 Hypertension: is not an issue right now-  likely would prefer permissive hypertension  4. Anemia of ESRD: hgb actually looks good -  anticipate it will go down with time  5. Metabolic Bone Disease: continue calcitriol , resume auryxia once eating  6.  Bleeding AVG -  as above  7. Hyperkalemia-  has responded to short term treatment and rectal kaexylate-  for recheck tonight-  cont with medicinal treatment-  hoping for HD early in the AM     Subjective:    Seen in room.  HD yesterday through a R fem catheter.  Sleeping this AM.  No further bleeding from AVG.     Objective:   BP (!) 167/51   Pulse (!) 109   Temp (!) 100.5 F (38.1 C) (Oral)   Resp 18   Wt 44.5 kg   SpO2 100%   BMI 21.99 kg/m   Intake/Output Summary (Last 24 hours) at 12/16/2021 1149 Last data filed at 12/16/2021 1000 Gross per 24 hour  Intake 673.99 ml  Output 2130 ml  Net -1456.01 ml   Weight change:   Physical Exam: Gen:NAD, sleeping and easily arousable CVS: RRR Resp: clear Abd: soft Ext: R sided weakness ACCESS: R fem catheter c/d/I, L AVG + T/B, no further bleeding  Imaging: DG Chest Port 1 View  Result Date: 12/16/2021 CLINICAL DATA:  Altered mental status EXAM: PORTABLE CHEST 1 VIEW COMPARISON:  Radiograph 10/12/2021 FINDINGS: Mildly enlarged cardiac silhouette. There is no focal airspace consolidation. There is no  pleural effusion or pneumothorax. Prior right proximal humerus fixation. Bilateral shoulder degenerative changes. Thoracic spondylosis. IMPRESSION: No evidence of acute cardiopulmonary disease. Unchanged mild cardiomegaly. Electronically Signed   By: Maurine Simmering M.D.   On: 12/16/2021 10:13   EEG adult  Result Date: 12/16/2021 [name], MD     12/16/2021  8:42 AM Patient Name: [name] MRN: [id number] Epilepsy Attending: [name] Referring Physician/Provider: Rigoberto Noel, MD Date: 12/16/2021 Duration: 23.25 mins Patient history: 66 yo F with right hemiplegia with leftward gaze and aphasia.EEG to evaluate for seizure Level of alertness: lethargic AEDs during EEG study: LEV Technical aspects: This EEG study was done with scalp electrodes positioned according to the 10-20 International system of electrode placement. Electrical activity was reviewed with band pass filter of 1-70Hz , sensitivity of 7 uV/mm, display speed of 43mm/sec with a 60Hz  notched filter applied as appropriate. EEG data were recorded continuously and digitally stored.  Video monitoring was available and reviewed as appropriate. Description: EEG showed continuous generalized 3 to 6 Hz theta-delta slowing. Hyperventilation and photic stimulation were not performed.   Of note, study was technically difficult due to significant myogenic artifact ABNORMALITY - Continuous slow, generalized IMPRESSION: This study is suggestive of moderate to severe diffuse encephalopathy, nonspecific etiology. No seizures or epileptiform discharges were seen throughout the recording. Priyanka O  Yadav   CT ANGIO HEAD NECK W WO CM W PERF (CODE STROKE)  Result Date: 12/15/2021 CLINICAL DATA:  Neuro deficit, acute, stroke suspected EXAM: CT ANGIOGRAPHY HEAD AND NECK CT PERFUSION BRAIN TECHNIQUE: Multidetector CT imaging of the head and neck was performed using the standard protocol during bolus administration of intravenous contrast.  Multiplanar CT image reconstructions and MIPs were obtained to evaluate the vascular anatomy. Carotid stenosis measurements (when applicable) are obtained utilizing NASCET criteria, using the distal internal carotid diameter as the denominator. Multiphase CT imaging of the brain was performed following IV bolus contrast injection. Subsequent parametric perfusion maps were calculated using RAPID software. RADIATION DOSE REDUCTION: This exam was performed according to the departmental dose-optimization program which includes automated exposure control, adjustment of the mA and/or kV according to patient size and/or use of iterative reconstruction technique. CONTRAST:  172mL OMNIPAQUE IOHEXOL 350 MG/ML SOLN COMPARISON:  None Available. FINDINGS: CTA NECK FINDINGS Aortic arch: Standard branching. Imaged portion shows no evidence of aneurysm or dissection. No significant stenosis of the major arch vessel origins. Right carotid system: No evidence of dissection, stenosis (50% or greater), or occlusion. Left carotid system: No evidence of dissection, stenosis (50% or greater), or occlusion. Vertebral arteries: Right dominant. No evidence of dissection, stenosis (50% or greater), or occlusion. Skeleton: Multilevel degenerative change including central disc protrusion at C3-C4. Other neck: No acute findings. Upper chest: Visualized lung apices are clear. Review of the MIP images confirms the above findings CTA HEAD FINDINGS Anterior circulation: Bilateral intracranial ICAs, MCAs, and ACAs are patent without proximal hemodynamically significant stenosis. Posterior circulation: Bilateral intradural vertebral arteries, basilar artery and bilateral posterior cerebral arteries are patent without proximal hemodynamically significant stenosis. Venous sinuses: As permitted by contrast timing, patent. CT Brain Perfusion Findings: CBF (<30%) Volume: 4mL Perfusion (Tmax>6.0s) volume: 78mL Mismatch Volume: 41mL Infarction Location:None  IMPRESSION: 1. No emergent large vessel occlusion or proximal hemodynamically significant stenosis. 2. Normal CT perfusion. Findings discussed with Dr. Leonel Ramsay via telephone 2:17 p.m. Electronically Signed   By: Margaretha Sheffield M.D.   On: 12/15/2021 14:23   CT HEAD CODE STROKE WO CONTRAST  Result Date: 12/15/2021 CLINICAL DATA:  Code stroke. Neuro deficit, acute, stroke suspected a series call [phone] EXAM: CT HEAD WITHOUT CONTRAST TECHNIQUE: Contiguous axial images were obtained from the base of the skull through the vertex without intravenous contrast. RADIATION DOSE REDUCTION: This exam was performed according to the departmental dose-optimization program which includes automated exposure control, adjustment of the mA and/or kV according to patient size and/or use of iterative reconstruction technique. COMPARISON:  CT head 10/12/2021. FINDINGS: Brain: No evidence of acute infarction, hemorrhage, hydrocephalus, extra-axial collection or mass lesion/mass effect. Patchy white matter hypodensities, nonspecific but compatible with chronic microvascular ischemic disease. Vascular: No hyperdense vessel identified. Skull: No acute fracture. Sinuses/Orbits: Visualized sinuses are clear. No acute orbital findings. Other: No mastoid effusions. ASPECTS [facility] Stroke Program Early CT Score) total score (0-10 with 10 being normal): 10. IMPRESSION: 1. No evidence of acute intracranial abnormality. 2. ASPECTS is 10. Code stroke imaging results were communicated on 12/15/2021 at 1:36 pm to provider Sharlett Iles via telephone, who verbally acknowledged these results. Electronically Signed   By: Margaretha Sheffield M.D.   On: 12/15/2021 13:37    Labs: BMET Recent Labs  Lab 12/15/21 9163 12/15/21 1325 12/15/21 1349 12/15/21 1615 12/15/21 2140 12/16/21 0212 12/16/21 0905  NA 119* 123* 121* 124* 126* 128* 132*  K 6.5* 7.2* 7.1* 6.2* 7.2* 6.1* 3.6  CL 99  89* 96* 90* 88* 89* 91*  CO2  --  21*  --  23 20* 21*  24  GLUCOSE 127* 131* 130* 116* 71 107* 120*  BUN 52* 55* 50* 55* 55* 57* 17  CREATININE 4.10* 3.79* 4.40* 4.12* 4.16* 4.45* 2.11*  CALCIUM  --  9.8  --  9.5 10.0 10.1 9.3  PHOS  --   --   --   --  4.3 4.0  --    CBC Recent Labs  Lab 12/15/21 1325 12/15/21 1349 12/16/21 0213 12/16/21 0905  WBC 3.5*  --  8.8 9.0  NEUTROABS 2.0  --   --   --   HGB 12.3 15.0 9.3* 9.5*  HCT 39.0 44.0 29.2* 28.9*  MCV 100.0  --  100.3* 97.6  PLT 112*  --  124* 130*    Medications:     Chlorhexidine Gluconate Cloth  6 each Topical Q0600   insulin aspart  0-6 Units Subcutaneous Q4H   sodium chloride flush  10-40 mL Intracatheter Q12H    [name], MD 12/16/2021, 11:49 AM

## 2021-12-16 NOTE — H&P (Signed)
Patients procedures cancelled as she presented with mouth full of Snuff

## 2021-12-16 NOTE — Telephone Encounter (Signed)
Spoke with Belinda at [facility], pt is currently admitted to hospital and needs to reschedule TCS/EGD +/- dil with Dr.Carver. Told her would give her a call once get December's schedule.

## 2021-12-16 NOTE — Progress Notes (Addendum)
Dr. Lamonte Sakai made aware of pt's HTN in the 160s-170s and that the PRN hydralazine order was conflicting. As of now, keep pt below SBP 180 until neurology comes to bedside for clarification.

## 2021-12-16 NOTE — Progress Notes (Signed)
Pt receives out-pt HD at [facility] on TTS. Pt arrives at 6:15 am for 6:30 am chair time. Will assist as needed.   Melven Sartorius Renal Navigator [phone]

## 2021-12-16 NOTE — Progress Notes (Signed)
NAME:  Erica [name], MRN:  [id number], DOB:  [birth year], LOS: 1 ADMISSION DATE:  12/15/2021, CONSULTATION DATE:  12/16/2021  REFERRING MD:  Ova Freshwater, ED PA , CHIEF COMPLAINT: Altered mental status  History of Present Illness:  66 year old female with hx DM, CHF, HTN, seizures, ESRD on HD brought in from nursing home with AMS, R sided weakness and leftward gaze.  She was last known well around 11 AM when she was brought back to SNF from colonoscopy (which was not preformed as she was found to have snuff in her mouth).  On arrival to ED, she had left-sided gaze, right side was flaccid.  Head CT was negative and no LVO. TNKase was administered at 1349 Labs showed hyponatremia and K of 7.2, PCCM consulted for admission, transfer to [facility] planned Per nursing, at her baseline she is not able to walk due to deconditioning but is able to move all 4 extremities  Pertinent  Medical History  ESRD on HD, left arm AV fistula Hypertension Seizure disorder, on Keppra Diabetes type 2, on insulin  Significant Hospital Events: Including procedures, antibiotic start and stop dates in addition to other pertinent events   R fem HD cath 10/31>>> CTA head/neck 10/30>>> 1. No emergent large vessel occlusion or proximal hemodynamically significant stenosis. 2. Normal CT perfusion. EEG 10/31>>> This study is suggestive of moderate to severe diffuse encephalopathy, nonspecific etiology. No seizures or epileptiform discharges were seen throughout the recording.  Interim History / Subjective:  Admitted overnight Had bleeding from L AV graft after TNK administration for ?stroke - resolved.  K trending down but remains hyperkalemic 7.2->6.1 Had urgent HD overnight - 2L off   Objective   Blood pressure (!) 165/49, pulse (!) 102, temperature 99.5 F (37.5 C), temperature source Axillary, resp. rate (!) 21, weight 44.5 kg, SpO2 100 %.        Intake/Output Summary (Last 24 hours) at 12/16/2021 0853 Last data  filed at 12/16/2021 0800 Gross per 24 hour  Intake 596.66 ml  Output 2080 ml  Net -1483.34 ml   Filed Weights   12/15/21 1345 12/15/21 2147 12/16/21 0500  Weight: 49.4 kg 46.5 kg 44.5 kg    Examination: General: Acute on chronically ill-appearing, no distress HENT: mm moist, leftward gaze Lungs: resps even non labored, few scattered rhonchi  Cardiovascular: S1-S2 regular, no murmur Abdomen: Soft, nontender, no guarding Extremities: No edema, no deformity, left arm AV fistula with good thrill, no bleeding  Neuro: eyes open, does not follow commands, leftward gaze, moves head, does not follow commands.    Resolved Hospital Problem list     Assessment & Plan:  Altered mental status with left gaze preference and right hemiplegia , possibly postictal versus acute CVA -head CT negative, CT angio negative for LVO, status post TNK by neurology 10/30 PLAN -  Stroke team to continue to follow  BP control  Correct electrolytes as below  Continue home keppra  MRI pending - needs EEG if MRI neg  Swallow eval once mental status allows    ESRD on HD Hyperkalemia Urgent HD 10/31 2L off  PLAN -  Stat f/u chem  Renal following  ?further HD   Hypoglycemia  Hx DM PLAN -  Continue D10 for now  Recheck labs - transition off D10 if Na remains low   HTN  PLAN -  Holding home carvedilol  PRN hydralazine to keep SBP <180   Anemia  AV graft bleeding post TNK - resolved  PLAN -  F/u CBC  Holding ASA for now   Best Practice (right click and "Reselect all SmartList Selections" daily)   Diet/type: NPO DVT prophylaxis: SCD GI prophylaxis: H2B Lines: Dialysis Catheter Foley:  N/A Code Status:  full code Last date of multidisciplinary goals of care discussion [NA]  Labs   CBC: Recent Labs  Lab 12/15/21 0833 12/15/21 1325 12/15/21 1349 12/16/21 0213  WBC  --  3.5*  --  8.8  NEUTROABS  --  2.0  --   --   HGB 15.0 12.3 15.0 9.3*  HCT 44.0 39.0 44.0 29.2*  MCV  --  100.0   --  100.3*  PLT  --  112*  --  124*    Basic Metabolic Panel: Recent Labs  Lab 12/15/21 1325 12/15/21 1349 12/15/21 1615 12/15/21 2140 12/16/21 0212  NA 123* 121* 124* 126* 128*  K 7.2* 7.1* 6.2* 7.2* 6.1*  CL 89* 96* 90* 88* 89*  CO2 21*  --  23 20* 21*  GLUCOSE 131* 130* 116* 71 107*  BUN 55* 50* 55* 55* 57*  CREATININE 3.79* 4.40* 4.12* 4.16* 4.45*  CALCIUM 9.8  --  9.5 10.0 10.1  PHOS  --   --   --  4.3 4.0   GFR: Estimated Creatinine Clearance: 7.7 mL/min (A) (by C-G formula based on SCr of 4.45 mg/dL (H)). Recent Labs  Lab 12/15/21 1325 12/16/21 0213  WBC 3.5* 8.8    Liver Function Tests: Recent Labs  Lab 12/15/21 1325 12/15/21 2140 12/16/21 0212  AST 41  --   --   ALT 45*  --   --   ALKPHOS 545*  --   --   BILITOT 0.8  --   --   PROT 9.0*  --   --   ALBUMIN 4.4 3.5 3.3*   No results for input(s): "LIPASE", "AMYLASE" in the last 168 hours. No results for input(s): "AMMONIA" in the last 168 hours.  ABG    Component Value Date/Time   PHART 7.272 (L) 11/09/2017 1030   PCO2ART 21.0 (L) 11/09/2017 1030   PO2ART 82.8 (L) 11/09/2017 1030   HCO3 10.3 (L) 09/17/2020 1958   TCO2 22 12/15/2021 1349   ACIDBASEDEF 17.9 (H) 09/17/2020 1958   O2SAT 73.4 09/17/2020 1958     Coagulation Profile: Recent Labs  Lab 12/15/21 1325  INR 1.1    Cardiac Enzymes: No results for input(s): "CKTOTAL", "CKMB", "CKMBINDEX", "TROPONINI" in the last 168 hours.  HbA1C: HbA1c, POC (controlled diabetic range)  Date/Time Value Ref Range Status  11/25/2020 02:40 PM 8.1 (A) 0.0 - 7.0 % Final   Hgb A1c MFr Bld  Date/Time Value Ref Range Status  10/16/2021 05:08 AM 5.5 4.8 - 5.6 % Final    Comment:    (NOTE) Pre diabetes:          5.7%-6.4%  Diabetes:              >6.4%  Glycemic control for   <7.0% adults with diabetes   06/19/2021 08:55 AM 6.4 (H) 4.8 - 5.6 % Final    Comment:    (NOTE) Pre diabetes:          5.7%-6.4%  Diabetes:               >6.4%  Glycemic control for   <7.0% adults with diabetes     CBG: Recent Labs  Lab 12/15/21 2034 12/15/21 2137 12/15/21 2334 12/16/21 0327 12/16/21 0729  GLUCAP 118* 71 85 86 104*  Critical care time: 40min    Katy Medard Decuir, NP Pulmonary/Critical Care Medicine  12/16/2021  8:53 AM    12/16/2021

## 2021-12-16 NOTE — Procedures (Signed)
Central Venous Catheter Insertion Procedure Note  Erica [name]  [id number]  [birth year]  Date:12/16/21  Time:1:10 AM   Provider Performing:Willye Javier Jerilynn Mages Ayesha Rumpf   Procedure: Insertion of Non-tunneled Central Venous Catheter(36556)with US guidance (53005)    Indication(s) Difficult access and Hemodialysis  Consent Risks of the procedure as well as the alternatives and risks of each were explained to the patient and/or caregiver.  Consent for the procedure was obtained and is signed in the bedside chart. Verbal consent obtained by telephone from patient's son, [name].  Anesthesia Topical only with 1% lidocaine   Timeout Verified patient identification, verified procedure, site/side was marked, verified correct patient position, special equipment/implants available, medications/allergies/relevant history reviewed, required imaging and test results available.  Sterile Technique Maximal sterile technique including full sterile barrier drape, hand hygiene, sterile gown, sterile gloves, mask, hair covering, sterile ultrasound probe cover (if used).  Procedure Description Area of catheter insertion was cleaned with chlorhexidine and draped in sterile fashion.   With real-time ultrasound guidance a HD catheter was placed into the right femoral vein.  Nonpulsatile blood flow and easy flushing noted in all ports.  The catheter was sutured in place and sterile dressing applied.  Complications/Tolerance None; patient tolerated the procedure well. Chest x-ray is not ordered for femoral cannulation.  EBL Minimal  Specimen(s) None  Lestine Mount, PA-C [facility] 12/16/21 1:11 AM  Please see [email or web address] for pager details.  From 7A-7P if no response, please call [phone] After hours, please call ELink [phone]

## 2021-12-16 NOTE — Telephone Encounter (Signed)
Belinda from [facility] needs to speak with Tammy R about patient being rescheduled for her procedure. [phone]

## 2021-12-17 ENCOUNTER — Inpatient Hospital Stay (HOSPITAL_COMMUNITY): Payer: Medicare Other

## 2021-12-17 DIAGNOSIS — R569 Unspecified convulsions: Secondary | ICD-10-CM | POA: Diagnosis not present

## 2021-12-17 DIAGNOSIS — R4182 Altered mental status, unspecified: Secondary | ICD-10-CM | POA: Diagnosis not present

## 2021-12-17 LAB — GLUCOSE, CAPILLARY
Glucose-Capillary: 157 mg/dL — ABNORMAL HIGH (ref 70–99)
Glucose-Capillary: 172 mg/dL — ABNORMAL HIGH (ref 70–99)
Glucose-Capillary: 157 mg/dL — ABNORMAL HIGH (ref 70–99)
Glucose-Capillary: 82 mg/dL (ref 70–99)
Glucose-Capillary: 109 mg/dL — ABNORMAL HIGH (ref 70–99)
Glucose-Capillary: 133 mg/dL — ABNORMAL HIGH (ref 70–99)

## 2021-12-17 LAB — BASIC METABOLIC PANEL
Anion gap: 18 — ABNORMAL HIGH (ref 5–15)
BUN: 22 mg/dL (ref 8–23)
CO2: 22 mmol/L (ref 22–32)
Calcium: 8.8 mg/dL — ABNORMAL LOW (ref 8.9–10.3)
Chloride: 93 mmol/L — ABNORMAL LOW (ref 98–111)
Creatinine, Ser: 3.05 mg/dL — ABNORMAL HIGH (ref 0.44–1.00)
GFR, Estimated: 16 mL/min — ABNORMAL LOW (ref >60)
Glucose, Bld: 178 mg/dL — ABNORMAL HIGH (ref 70–99)
Potassium: 2.8 mmol/L — ABNORMAL LOW (ref 3.5–5.1)
Sodium: 133 mmol/L — ABNORMAL LOW (ref 135–145)

## 2021-12-17 LAB — CBC
HCT: 27.2 % — ABNORMAL LOW (ref 36.0–46.0)
Hemoglobin: 8.5 g/dL — ABNORMAL LOW (ref 12.0–15.0)
MCH: 31.8 pg (ref 26.0–34.0)
MCHC: 31.3 g/dL (ref 30.0–36.0)
MCV: 101.9 fL — ABNORMAL HIGH (ref 80.0–100.0)
Platelets: 106 10*3/uL — ABNORMAL LOW (ref 150–400)
RBC: 2.67 MIL/uL — ABNORMAL LOW (ref 3.87–5.11)
RDW: 13.7 % (ref 11.5–15.5)
WBC: 19.3 10*3/uL — ABNORMAL HIGH (ref 4.0–10.5)
nRBC: 0 % (ref 0.0–0.2)

## 2021-12-17 LAB — PHOSPHORUS: Phosphorus: 3.9 mg/dL (ref 2.5–4.6)

## 2021-12-17 LAB — MAGNESIUM: Magnesium: 1.7 mg/dL (ref 1.7–2.4)

## 2021-12-17 LAB — CBG MONITORING, ED: Glucose-Capillary: 121 mg/dL — ABNORMAL HIGH (ref 70–99)

## 2021-12-17 MED ORDER — SODIUM CHLORIDE 0.9 % IV SOLN
250.0000 mg | INTRAVENOUS | Status: DC
  Filled 2021-12-17: qty 2.5

## 2021-12-17 MED ORDER — LEVETIRACETAM IN NACL 500 MG/100ML IV SOLN
500.0000 mg | Freq: Every day | INTRAVENOUS | Status: DC
  Administered 2021-12-18 – 2021-12-27 (×10): 500 mg via INTRAVENOUS
  Filled 2021-12-17 (×10): qty 100

## 2021-12-17 MED ORDER — SODIUM CHLORIDE 0.9 % IV SOLN
200.0000 mg | Freq: Once | INTRAVENOUS | Status: AC
  Administered 2021-12-17: 200 mg via INTRAVENOUS
  Filled 2021-12-17: qty 20

## 2021-12-17 MED ORDER — PANTOPRAZOLE SODIUM 40 MG IV SOLR
40.0000 mg | Freq: Every day | INTRAVENOUS | Status: DC
  Administered 2021-12-17 – 2021-12-27 (×11): 40 mg via INTRAVENOUS
  Filled 2021-12-17 (×11): qty 10

## 2021-12-17 MED ORDER — OSMOLITE 1.2 CAL PO LIQD
1000.0000 mL | ORAL | Status: DC
  Administered 2021-12-17: 1000 mL
  Filled 2021-12-17 (×2): qty 1000

## 2021-12-17 MED ORDER — SODIUM CHLORIDE 0.9 % IV SOLN
100.0000 mg | Freq: Two times a day (BID) | INTRAVENOUS | Status: DC
  Administered 2021-12-18: 100 mg via INTRAVENOUS
  Filled 2021-12-17 (×2): qty 10

## 2021-12-17 MED ORDER — ORAL CARE MOUTH RINSE
15.0000 mL | OROMUCOSAL | Status: DC
  Administered 2021-12-18 (×3): 15 mL via OROMUCOSAL

## 2021-12-17 MED ORDER — LORAZEPAM 2 MG/ML IJ SOLN: 2.0000 mg | INTRAMUSCULAR | Status: DC | PRN

## 2021-12-17 MED ORDER — FOLIC ACID 1 MG PO TABS
1.0000 mg | ORAL_TABLET | Freq: Every day | ORAL | Status: DC
  Administered 2021-12-18 – 2021-12-31 (×14): 1 mg
  Filled 2021-12-17 (×14): qty 1

## 2021-12-17 MED ORDER — ORAL CARE MOUTH RINSE: 15.0000 mL | OROMUCOSAL | Status: DC | PRN

## 2021-12-17 MED ORDER — FERRIC CITRATE 1 GM 210 MG(FE) PO TABS: 420.0000 mg | ORAL_TABLET | Freq: Three times a day (TID) | ORAL | Status: DC

## 2021-12-17 MED ORDER — CARVEDILOL 25 MG PO TABS: 25.0000 mg | ORAL_TABLET | Freq: Two times a day (BID) | ORAL | Status: DC

## 2021-12-17 MED ORDER — HYDRALAZINE HCL 50 MG PO TABS
50.0000 mg | ORAL_TABLET | Freq: Three times a day (TID) | ORAL | Status: DC
  Administered 2021-12-17 – 2021-12-18 (×3): 50 mg
  Filled 2021-12-17 (×3): qty 1

## 2021-12-17 MED ORDER — CARVEDILOL 25 MG PO TABS
25.0000 mg | ORAL_TABLET | Freq: Two times a day (BID) | ORAL | Status: DC
  Administered 2021-12-18: 25 mg
  Filled 2021-12-17: qty 1

## 2021-12-17 MED ORDER — POTASSIUM CHLORIDE 10 MEQ/100ML IV SOLN
10.0000 meq | INTRAVENOUS | Status: AC
  Administered 2021-12-17 (×2): 10 meq via INTRAVENOUS
  Filled 2021-12-17 (×2): qty 100

## 2021-12-17 MED ORDER — ADULT MULTIVITAMIN W/MINERALS CH
1.0000 | ORAL_TABLET | Freq: Every day | ORAL | Status: DC
  Administered 2021-12-17: 1
  Filled 2021-12-17: qty 1

## 2021-12-17 NOTE — Procedures (Signed)
Cortrak  Person Inserting Tube:  Keno Caraway, RD Tube Type:  Cortrak - 43 inches Tube Size:  10 Tube Location:  Left nare Secured by: Bridle Technique Used to Measure Tube Placement:  Marking at nare/corner of mouth Cortrak Secured At:  67 cm Procedure Comments:  Cortrak Tube Team Note:  Consult received to place a Cortrak feeding tube.   X-ray is required, abdominal x-ray has been ordered by the Cortrak team. Please confirm tube placement before using the Cortrak tube.   If the tube becomes dislodged please keep the tube and contact the Cortrak team at [email or web address] for replacement.  If after hours and replacement cannot be delayed, place a NG tube and confirm placement with an abdominal x-ray.   Cate Keanen Dohse MS, RDN, LDN, CNSC Registered Dietitian 3 Clinical Nutrition RD Pager and On-Call Pager Number Located in Amion       

## 2021-12-17 NOTE — Progress Notes (Signed)
NAME:  Erica [name], MRN:  [id number], DOB:  1956-02-09, LOS: 2 ADMISSION DATE:  12/15/2021, CONSULTATION DATE:  12/17/2021  REFERRING MD:  Ova Freshwater, ED PA , CHIEF COMPLAINT: Altered mental status  History of Present Illness:  66 year old female with hx DM, CHF, HTN, seizures, ESRD on HD brought in from nursing home with AMS, R sided weakness and leftward gaze.  She was last known well around 11 AM when she was brought back to SNF from colonoscopy (which was not preformed as she was found to have snuff in her mouth).  On arrival to ED, she had left-sided gaze, right side was flaccid.  Head CT was negative and no LVO. TNKase was administered at 1349 Labs showed hyponatremia and K of 7.2, PCCM consulted for admission, transfer to [facility] planned Per nursing, at her baseline she is not able to walk due to deconditioning but is able to move all 4 extremities  Pertinent  Medical History  ESRD on HD, left arm AV fistula Hypertension Seizure disorder, on Keppra Diabetes type 2, on insulin  Significant Hospital Events: Including procedures, antibiotic start and stop dates in addition to other pertinent events   R fem HD cath 10/31>>> CTA head/neck 10/30>>> 1. No emergent large vessel occlusion or proximal hemodynamically significant stenosis. 2. Normal CT perfusion. EEG 10/31>>> This study is suggestive of moderate to severe diffuse encephalopathy, nonspecific etiology. No seizures or epileptiform discharges were seen throughout the recording. MRI brain 10/31 >, no evidence for acute stroke, left cortical ribbon, question consistent with seizure EEG 10/31 > moderate diffuse encephalopathy, nonspecific.  No seizure activity, no epileptiform discharges  Interim History / Subjective:   Hypokalemia, hypoglycemia reported overnight.  D10 started 40 cc/h I/O- 860   Objective   Blood pressure (!) 146/56, pulse 89, temperature 99.1 F (37.3 C), temperature source Oral, resp. rate 15, weight 45.2  kg, SpO2 100 %.        Intake/Output Summary (Last 24 hours) at 12/17/2021 0820 Last data filed at 12/17/2021 0600 Gross per 24 hour  Intake 815.34 ml  Output 200 ml  Net 615.34 ml   Filed Weights   12/15/21 2147 12/16/21 0500 12/17/21 0500  Weight: 46.5 kg 44.5 kg 45.2 kg    Examination: General: Thin chronically ill woman.  No distress HENT: Oropharynx clear, large tongue.  Gaze preference appears to be improved Lungs: Clear bilaterally.  No crackles or wheezes Cardiovascular: Regular, distant, no murmur Abdomen: Nondistended, positive bowel sounds Extremities: No edema.  Left upper extremity AV fistula with good pulse, good thrill but with some blistering and question increased edema Neuro: Opens eyes.  Gaze preference appears to be improved.  Not moving her right upper or lower extremity.  Attempted to phonate and answer questions but unintelligible.   Resolved Hospital Problem list     Assessment & Plan:  Altered mental status with left gaze preference and right hemiplegia , possibly postictal versus acute CVA -head CT negative, CT angio negative for LVO, status post TNK by neurology 10/30.  MRI brain without any evidence of acute stroke 10/31 PLAN -  -Appreciate neurology/stroke team input -Blood pressure control, SBP <180 -Correct electrolytes and avoid any sedating medications -Continue home Keppra -Will need swallowing evaluation when able to participate  ESRD on HD Hyperkalemia-now with hypokalemia PLAN -  -Appreciate nephrology input -Replace potassium -HD as per nephrology schedule  Hypoglycemia  Hx DM PLAN -  -Continue D10 for now -Place core track for enteral nutrition until she can  pass swallowing evaluation -Sign scale insulin available  HTN  PLAN -  -Restart enteral blood pressure regimen when core track in place -Hydralazine ordered as needed, goal SBP < 180  Anemia  AV graft bleeding post TNK - resolved  PLAN -  -Following CBC -Should be  able to restart aspirin on 11/1   Best Practice (right click and "Reselect all SmartList Selections" daily)   Diet/type: NPO DVT prophylaxis: SCD GI prophylaxis: H2B Lines: Dialysis Catheter Foley:  N/A Code Status:  full code Last date of multidisciplinary goals of care discussion [NA]  Labs   CBC: Recent Labs  Lab 12/15/21 0833 12/15/21 1325 12/15/21 1349 12/16/21 0213 12/16/21 0905  WBC  --  3.5*  --  8.8 9.0  NEUTROABS  --  2.0  --   --   --   HGB 15.0 12.3 15.0 9.3* 9.5*  HCT 44.0 39.0 44.0 29.2* 28.9*  MCV  --  100.0  --  100.3* 97.6  PLT  --  112*  --  124* 130*    Basic Metabolic Panel: Recent Labs  Lab 12/15/21 1615 12/15/21 2140 12/16/21 0212 12/16/21 0905 12/17/21 0409  NA 124* 126* 128* 132* 133*  K 6.2* 7.2* 6.1* 3.6 2.8*  CL 90* 88* 89* 91* 93*  CO2 23 20* 21* 24 22  GLUCOSE 116* 71 107* 120* 178*  BUN 55* 55* 57* 17 22  CREATININE 4.12* 4.16* 4.45* 2.11* 3.05*  CALCIUM 9.5 10.0 10.1 9.3 8.8*  PHOS  --  4.3 4.0  --   --    GFR: Estimated Creatinine Clearance: 11.3 mL/min (A) (by C-G formula based on SCr of 3.05 mg/dL (H)). Recent Labs  Lab 12/15/21 1325 12/16/21 0213 12/16/21 0905  WBC 3.5* 8.8 9.0    Liver Function Tests: Recent Labs  Lab 12/15/21 1325 12/15/21 2140 12/16/21 0212  AST 41  --   --   ALT 45*  --   --   ALKPHOS 545*  --   --   BILITOT 0.8  --   --   PROT 9.0*  --   --   ALBUMIN 4.4 3.5 3.3*   No results for input(s): "LIPASE", "AMYLASE" in the last 168 hours. No results for input(s): "AMMONIA" in the last 168 hours.  ABG    Component Value Date/Time   PHART 7.272 (L) 11/09/2017 1030   PCO2ART 21.0 (L) 11/09/2017 1030   PO2ART 82.8 (L) 11/09/2017 1030   HCO3 10.3 (L) 09/17/2020 1958   TCO2 22 12/15/2021 1349   ACIDBASEDEF 17.9 (H) 09/17/2020 1958   O2SAT 73.4 09/17/2020 1958     Coagulation Profile: Recent Labs  Lab 12/15/21 1325  INR 1.1    Cardiac Enzymes: No results for input(s): "CKTOTAL",  "CKMB", "CKMBINDEX", "TROPONINI" in the last 168 hours.  HbA1C: HbA1c, POC (controlled diabetic range)  Date/Time Value Ref Range Status  11/25/2020 02:40 PM 8.1 (A) 0.0 - 7.0 % Final   Hgb A1c MFr Bld  Date/Time Value Ref Range Status  10/16/2021 05:08 AM 5.5 4.8 - 5.6 % Final    Comment:    (NOTE) Pre diabetes:          5.7%-6.4%  Diabetes:              >6.4%  Glycemic control for   <7.0% adults with diabetes   06/19/2021 08:55 AM 6.4 (H) 4.8 - 5.6 % Final    Comment:    (NOTE) Pre diabetes:  5.7%-6.4%  Diabetes:              >6.4%  Glycemic control for   <7.0% adults with diabetes     CBG: Recent Labs  Lab 12/16/21 1925 12/16/21 2005 12/16/21 2319 12/17/21 0332 12/17/21 0722  GLUCAP 54* 128* 149* 157* 172*     Critical care time: 31 minutes     Baltazar Apo, MD, PhD 12/17/2021, 8:21 AM [facility] [phone] or if no answer before 7:00PM call [phone] For any issues after 7:00PM please call eLink [phone]

## 2021-12-17 NOTE — Progress Notes (Signed)
eLink Physician-Brief Progress Note Patient Name: Erica [name] DOB: 14-Jun-1955 MRN: [id number]   Date of Service  12/17/2021  HPI/Events of Note  Hypokalemia - K+ = 2.8. Patient is ESRD on HD.  eICU Interventions  Defer K+ management to Nephrology service.      Intervention Category Major Interventions: Electrolyte abnormality - evaluation and management  [place] Val[place] 12/17/2021, 6:13 AM

## 2021-12-17 NOTE — Progress Notes (Signed)
Notified by [facility] monitor of generalized discharges as well as staring and head jerking noted on video EEG.  We will add Vimpat per Dr. Karolee Stamps plan.  We will continue to monitor  -- Amie Portland, MD Neurologist [facility] Pager: [phone]

## 2021-12-17 NOTE — Progress Notes (Signed)
LTM EEG running - no initial skin breakdown - push button tested. [facility] notified  

## 2021-12-17 NOTE — TOC Progression Note (Signed)
Transition of Care [facility]) - Initial/Assessment Note    Patient Details  Name: Erica [name] MRN: [id number] Date of Birth: November 03, 1955  Transition of Care [facility]) CM/SW Contact:    Milinda Antis, [place] Phone Number: 12/17/2021, 3:00 PM  Clinical Narrative:                 LCSW contacted [facility] and was informed that the patient was ST at the facility.  The facility is willing to accept the patient back.  An FL2 will be needed prior to admitting back to the facility.  TOC will continue to follow.         Patient Goals and CMS Choice        Expected Discharge Plan and Services                                                Prior Living Arrangements/Services                       Activities of Daily Living      Permission Sought/Granted                  Emotional Assessment              Admission diagnosis:  Altered mental status [R41.82] Patient Active Problem List   Diagnosis Date Noted   Esophageal dysphagia 11/10/2021   Poor appetite 11/10/2021   H/O adenomatous polyp of colon 11/10/2021   Benign essential HTN 10/17/2021   Macrocytic anemia 10/13/2021   GI bleed 10/13/2021   Elevated troponin 10/13/2021   GERD (gastroesophageal reflux disease) 10/13/2021   Colitis 10/12/2021   Atrial fibrillation ([place])    Hypothermia 06/19/2021   Abnormal EKG 06/19/2021   Right hip pain    CAD (coronary artery disease) 09/17/2020   Altered mental status    Acute metabolic encephalopathy 03/47/4259   Diarrhea    Pressure injury of skin 06/09/2020   Sacral fracture ([place]) 06/08/2020   Thrombocytopenia ([place]) 06/08/2020   Hypertensive urgency 06/08/2020   (HFpEF) heart failure with preserved ejection fraction ([place]) 06/08/2020   Fall 06/08/2020   Elevated MCV 06/08/2020   Palliative care by specialist    Generalized weakness 11/14/2019   HCAP (healthcare-associated pneumonia) 11/14/2019   Hyperkalemia 11/13/2019   Hyponatremia  09/27/2019   Closed fracture of right proximal humerus 04/20/2019   Surgery, elective    Seizure ([place]) 11/12/2018   History of hydronephrosis --stents in place 12/08/2017   Stroke ([place]) 11/29/2017   Seizures ([place])    Hydronephrosis    Recurrent UTI    Diabetes mellitus type 2 in nonobese (HCC)    ESRD (end stage renal disease) ([place])    Anemia    Chronic diastolic congestive heart failure ([place])    Essential hypertension    PICC (peripherally inserted central catheter) in place    Acute pyelonephritis 11/12/2017   Uncontrolled type 2 diabetes mellitus with hyperglycemia, with long-term current use of insulin ([place]) 11/10/2017   Renal failure 11/08/2017   Seizure disorder ([place]) 11/08/2017   Orthostatic hypotension 07/25/2014   Moderate protein malnutrition ([place]) 09/15/2013   Aspiration pneumonia ([place]) 09/15/2013   Acute respiratory failure requiring reintubation ([place]) 09/11/2013   probable Seizures due to metabolic disorder 56/38/7564   Lactic acidosis 03/19/2013   Abdominal pain 03/19/2013  Rotavirus infection 10/29/2012   Diabetes mellitus with end-stage renal disease ([place]) 10/29/2012   Protein-calorie malnutrition, severe ([place]) 10/27/2012   NSTEMI (non-ST elevated myocardial infarction) ([place]) 10/26/2012   Fever, unspecified 10/26/2012   Hypotension 29/47/6546   Metabolic acidosis 50/35/4656   Chronic diarrhea 10/25/2012   Tobacco abuse 10/25/2012   DKA (diabetic ketoacidoses) 09/09/2012   Dehydration 09/09/2012   DKA, type 2 ([place]) 05/20/2012   Abnormal LFTs 05/20/2012   Heart murmur, systolic 81/27/5170   Hypoglycemia 01/74/9449   Metabolic encephalopathy 67/59/1638   Alcohol abuse 07/08/2011   Hypokalemia 07/08/2011   Nausea & vomiting 07/08/2011   H/O chronic pancreatitis 07/08/2011   PCP:  System, Provider Not In Pharmacy:   [place], [place] [address] Phone: [phone] Fax:  [place], [place] - [address] [place] [address] [place] [address] Phone: [phone] Fax: [place], [place] - [address] [place] [address] Phone: [phone] Fax: [phone]     Social Determinants of Health (SDOH) Interventions    Readmission Risk Interventions    08/20/2020   11:24 AM 06/11/2020   12:31 PM 11/19/2019   11:36 AM  Readmission Risk Prevention Plan  Transportation Screening Complete Complete Complete  Medication Review (RN Care Manager) Complete Complete Complete  PCP or Specialist appointment within 3-5 days of discharge Complete  Complete  HRI or Home Care Consult Complete Complete Complete  SW Recovery Care/Counseling Consult Complete Complete Complete  Palliative Care Screening Not Applicable Not Applicable Complete  Skilled Nursing Facility Complete Complete Complete

## 2021-12-17 NOTE — Progress Notes (Signed)
Left arm fistula with small area of epidermolysis Recommend popping the blister and placing a small piece of xeroform over it, with dry dressing.  Can use the fistula away from this site if needed.    Broadus John MD

## 2021-12-17 NOTE — Progress Notes (Addendum)
Subjective: No clinical seizures overnight.  Per RN patient is more awake today and able to look around the room but still not following commands.  ROS: Unable to obtain due to poor mental status  Examination  Vital signs in last 24 hours: Temp:  [98.2 F (36.8 C)-101.8 F (38.8 C)] 99.1 F (37.3 C) (11/01 0724) Pulse Rate:  [89-110] 100 (11/01 1000) Resp:  [14-25] 15 (11/01 1000) BP: (118-176)/(40-128) 155/43 (11/01 1000) SpO2:  [98 %-100 %] 100 % (11/01 1000) Weight:  [45.2 kg] 45.2 kg (11/01 0500)  General: lying in bed, NAD Neuro: Opens eyes to repeated tactile stimuli and attempted to follow commands but not consistently, PERRLA, no forced gaze deviation, moving all 4 extremities with right hemiparesis  Basic Metabolic Panel: Recent Labs  Lab 12/15/21 1615 12/15/21 2140 12/16/21 0212 12/16/21 0905 12/17/21 0409  NA 124* 126* 128* 132* 133*  K 6.2* 7.2* 6.1* 3.6 2.8*  CL 90* 88* 89* 91* 93*  CO2 23 20* 21* 24 22  GLUCOSE 116* 71 107* 120* 178*  BUN 55* 55* 57* 17 22  CREATININE 4.12* 4.16* 4.45* 2.11* 3.05*  CALCIUM 9.5 10.0 10.1 9.3 8.8*  PHOS  --  4.3 4.0  --   --     CBC: Recent Labs  Lab 12/15/21 0833 12/15/21 1325 12/15/21 1349 12/16/21 0213 12/16/21 0905  WBC  --  3.5*  --  8.8 9.0  NEUTROABS  --  2.0  --   --   --   HGB 15.0 12.3 15.0 9.3* 9.5*  HCT 44.0 39.0 44.0 29.2* 28.9*  MCV  --  100.0  --  100.3* 97.6  PLT  --  112*  --  124* 130*     Coagulation Studies: Recent Labs    12/15/21 1325  LABPROT 13.7  INR 1.1    Imaging MRI brain without contrast 12/16/2021: No evidence of acute intracranial abnormality.      ASSESSMENT AND PLAN: 66 year old female with history of epilepsy who presented with breakthrough seizures in the setting of hyponatremia (sodium 119).  MRI brain showed restricted diffusion/cortical reddening and left parieto-occipital region  Epilepsy with breakthrough seizure -Etiology: Hyponatremia with sodium 119.  No  evidence of infection (no leukocytosis, afebrile) - Its unclear what patient's AED dosing is. She was last seen by our team in 2020 and at that time was discharged on Dilantin 150 mg twice daily, Topamax 25 mg twice daily and Keppra 1000mg  daily with additional 500 mg postdialysis.  However during current admission it appears that patient was only on Keppra 500 mg daily  Recommendations -Continue Keppra 500 mg daily with additional dose of 250 mg after dialysis -We will order LTM EEG as patient is still aphasic which could be postictal versus due to intermittent seizures -If any further seizures, will add Vimpat as patient is already on maximal renally adjusted dose of Keppra -Continue seizure precautions -As needed IV Ativan 2 mg for clinical seizure-like activity  I have spent a total of  38  minutes with the patient reviewing hospital notes,  test results, labs and examining the patient as well as establishing an assessment and plan that was discussed personally with the patient.  > 50% of time was spent in direct patient care.   Zeb Comfort Epilepsy [facility] For questions after 5pm please refer to AMION to reach the Neurologist on call

## 2021-12-17 NOTE — Progress Notes (Signed)
This rn paged and informed Dr [name] pt with dialysis orders to keep sbp greater than 140. Pt maintaining sbps 100s-low 140s. Will keep sbp greater than 100 and change uf removal from 2-2.5 to 1 liter as pt tolerates per md instructions.

## 2021-12-17 NOTE — Progress Notes (Signed)
[name] Progress Note   Assessment/ Plan:   Assessment/Plan: 66 year old BF with multiple medical issues including ESRD-  now admitted with code stroke s/p TNK 1 new CVA- with lateral gaze and right sided weakness-  s/p TNK per neurology-  neuro checks ongoing  2 ESRD: normally TTS at [facility]  - HD 10/30, will do HD tomorrow through AVG - VVS had been consulted, OK to use AVG away from areas of epidermolysis 3 Hypertension: is not an issue right now-  likely would prefer permissive hypertension  4. Anemia of ESRD: hgb actually looks good -  anticipate it will go down with time  5. Metabolic Bone Disease: continue calcitriol , resume auryxia once eating  6.  Bleeding AVG -  as above  7. Hyperkalemia-  resolved 8.  Dispo: needs to get pall care c/s    Subjective:    Seen in room.  Sleeping, arousable.  No needs expressed.   Objective:   BP (!) 160/46   Pulse 94   Temp 98.8 F (37.1 C) (Oral)   Resp 16   Wt 45.2 kg   SpO2 100%   BMI 22.34 kg/m   Intake/Output Summary (Last 24 hours) at 12/17/2021 1333 Last data filed at 12/17/2021 1200 Gross per 24 hour  Intake 1075.72 ml  Output 320 ml  Net 755.72 ml   Weight change: -4.2 kg  Physical Exam: Gen:NAD, sleeping and easily arousable CVS: RRR Resp: clear Abd: soft Ext: R sided weakness ACCESS: R fem catheter c/d/I, L AVG + T/B, no further bleeding.  + two blisters on AVG- see vascular note  Imaging: DG Abd Portable 1V  Result Date: 12/17/2021 CLINICAL DATA:  Feeding tube placement EXAM: PORTABLE ABDOMEN - 1 VIEW COMPARISON:  09/22/2020 FINDINGS: Weighted tip feeding tube is been advanced to the level of the pylorus . Stomach is decompressed. Bowel gas pattern unremarkable. Right femoral catheter extends up to the L5 level. Right hip arthroplasty component partially visualized. IMPRESSION: Feeding tube advanced to the pylorus . Electronically Signed   By: Lucrezia Europe M.D.   On: 12/17/2021 11:54   MR  BRAIN WO CONTRAST  Result Date: 12/16/2021 CLINICAL DATA:  Neuro deficit, acute, stroke suspected EXAM: MRI HEAD WITHOUT CONTRAST TECHNIQUE: Multiplanar, multiecho pulse sequences of the brain and surrounding structures were obtained without intravenous contrast. COMPARISON:  CT head December 15, 2021. MRI head August 16, 2020. FINDINGS: Brain: No acute infarction, hemorrhage, hydrocephalus, extra-axial collection or mass lesion. Mild for age scattered T2/FLAIR hyperintensities in the white matter, nonspecific but compatible with chronic microvascular ischemic disease. Remote left parietal infarct. Vascular: Major arterial flow voids are maintained skull base. Skull and upper cervical spine: Normal marrow signal. Sinuses/Orbits: Clear sinuses.  No acute orbital findings. Other: No mastoid effusions. IMPRESSION: No evidence of acute intracranial abnormality. Electronically Signed   By: Margaretha Sheffield M.D.   On: 12/16/2021 14:27   DG Chest Port 1 View  Result Date: 12/16/2021 CLINICAL DATA:  Altered mental status EXAM: PORTABLE CHEST 1 VIEW COMPARISON:  Radiograph 10/12/2021 FINDINGS: Mildly enlarged cardiac silhouette. There is no focal airspace consolidation. There is no pleural effusion or pneumothorax. Prior right proximal humerus fixation. Bilateral shoulder degenerative changes. Thoracic spondylosis. IMPRESSION: No evidence of acute cardiopulmonary disease. Unchanged mild cardiomegaly. Electronically Signed   By: Maurine Simmering M.D.   On: 12/16/2021 10:13   EEG adult  Result Date: 12/16/2021 [name], MD     12/16/2021  8:42 AM Patient  Name: Erica [name] MRN: [id number] Epilepsy Attending: [name] Referring Physician/Provider: Rigoberto Noel, MD Date: 12/16/2021 Duration: 23.25 mins Patient history: 66 yo F with right hemiplegia with leftward gaze and aphasia.EEG to evaluate for seizure Level of alertness: lethargic AEDs during EEG study: LEV Technical aspects: This EEG study was  done with scalp electrodes positioned according to the 10-20 International system of electrode placement. Electrical activity was reviewed with band pass filter of 1-70Hz , sensitivity of 7 uV/mm, display speed of 63mm/sec with a 60Hz  notched filter applied as appropriate. EEG data were recorded continuously and digitally stored.  Video monitoring was available and reviewed as appropriate. Description: EEG showed continuous generalized 3 to 6 Hz theta-delta slowing. Hyperventilation and photic stimulation were not performed.   Of note, study was technically difficult due to significant myogenic artifact ABNORMALITY - Continuous slow, generalized IMPRESSION: This study is suggestive of moderate to severe diffuse encephalopathy, nonspecific etiology. No seizures or epileptiform discharges were seen throughout the recording. Priyanka Barbra Sarks   CT ANGIO HEAD NECK W WO CM W PERF (CODE STROKE)  Result Date: 12/15/2021 CLINICAL DATA:  Neuro deficit, acute, stroke suspected EXAM: CT ANGIOGRAPHY HEAD AND NECK CT PERFUSION BRAIN TECHNIQUE: Multidetector CT imaging of the head and neck was performed using the standard protocol during bolus administration of intravenous contrast. Multiplanar CT image reconstructions and MIPs were obtained to evaluate the vascular anatomy. Carotid stenosis measurements (when applicable) are obtained utilizing NASCET criteria, using the distal internal carotid diameter as the denominator. Multiphase CT imaging of the brain was performed following IV bolus contrast injection. Subsequent parametric perfusion maps were calculated using RAPID software. RADIATION DOSE REDUCTION: This exam was performed according to the departmental dose-optimization program which includes automated exposure control, adjustment of the mA and/or kV according to patient size and/or use of iterative reconstruction technique. CONTRAST:  142mL OMNIPAQUE IOHEXOL 350 MG/ML SOLN COMPARISON:  None Available. FINDINGS: CTA  NECK FINDINGS Aortic arch: Standard branching. Imaged portion shows no evidence of aneurysm or dissection. No significant stenosis of the major arch vessel origins. Right carotid system: No evidence of dissection, stenosis (50% or greater), or occlusion. Left carotid system: No evidence of dissection, stenosis (50% or greater), or occlusion. Vertebral arteries: Right dominant. No evidence of dissection, stenosis (50% or greater), or occlusion. Skeleton: Multilevel degenerative change including central disc protrusion at C3-C4. Other neck: No acute findings. Upper chest: Visualized lung apices are clear. Review of the MIP images confirms the above findings CTA HEAD FINDINGS Anterior circulation: Bilateral intracranial ICAs, MCAs, and ACAs are patent without proximal hemodynamically significant stenosis. Posterior circulation: Bilateral intradural vertebral arteries, basilar artery and bilateral posterior cerebral arteries are patent without proximal hemodynamically significant stenosis. Venous sinuses: As permitted by contrast timing, patent. CT Brain Perfusion Findings: CBF (<30%) Volume: 64mL Perfusion (Tmax>6.0s) volume: 38mL Mismatch Volume: 4mL Infarction Location:None IMPRESSION: 1. No emergent large vessel occlusion or proximal hemodynamically significant stenosis. 2. Normal CT perfusion. Findings discussed with Dr. Leonel Ramsay via telephone 2:17 p.m. Electronically Signed   By: Margaretha Sheffield M.D.   On: 12/15/2021 14:23    Labs: BMET Recent Labs  Lab 12/15/21 1325 12/15/21 1349 12/15/21 1615 12/15/21 2140 12/16/21 0212 12/16/21 0905 12/17/21 0409  NA 123* 121* 124* 126* 128* 132* 133*  K 7.2* 7.1* 6.2* 7.2* 6.1* 3.6 2.8*  CL 89* 96* 90* 88* 89* 91* 93*  CO2 21*  --  23 20* 21* 24 22  GLUCOSE 131* 130* 116* 71 107* 120* 178*  BUN 55* 50* 55* 55* 57* 17 22  CREATININE 3.79* 4.40* 4.12* 4.16* 4.45* 2.11* 3.05*  CALCIUM 9.8  --  9.5 10.0 10.1 9.3 8.8*  PHOS  --   --   --  4.3 4.0  --   --     CBC Recent Labs  Lab 12/15/21 1325 12/15/21 1349 12/16/21 0213 12/16/21 0905  WBC 3.5*  --  8.8 9.0  NEUTROABS 2.0  --   --   --   HGB 12.3 15.0 9.3* 9.5*  HCT 39.0 44.0 29.2* 28.9*  MCV 100.0  --  100.3* 97.6  PLT 112*  --  124* 130*    Medications:     Chlorhexidine Gluconate Cloth  6 each Topical Q0600   heparin injection (subcutaneous)  5,000 Units Subcutaneous Q12H   insulin aspart  0-6 Units Subcutaneous Q4H   multivitamin with minerals  1 tablet Per Tube Daily   sodium chloride flush  10-40 mL Intracatheter Q12H    [name], MD 12/17/2021, 1:33 PM

## 2021-12-18 ENCOUNTER — Inpatient Hospital Stay (HOSPITAL_COMMUNITY): Payer: Medicare Other

## 2021-12-18 DIAGNOSIS — R569 Unspecified convulsions: Secondary | ICD-10-CM | POA: Diagnosis not present

## 2021-12-18 DIAGNOSIS — G40901 Epilepsy, unspecified, not intractable, with status epilepticus: Secondary | ICD-10-CM | POA: Diagnosis present

## 2021-12-18 DIAGNOSIS — G40211 Localization-related (focal) (partial) symptomatic epilepsy and epileptic syndromes with complex partial seizures, intractable, with status epilepticus: Secondary | ICD-10-CM | POA: Diagnosis not present

## 2021-12-18 LAB — BASIC METABOLIC PANEL
Anion gap: 15 (ref 5–15)
Anion gap: 13 (ref 5–15)
BUN: 11 mg/dL (ref 8–23)
BUN: 15 mg/dL (ref 8–23)
CO2: 28 mmol/L (ref 22–32)
CO2: 28 mmol/L (ref 22–32)
Calcium: 9.2 mg/dL (ref 8.9–10.3)
Calcium: 8.8 mg/dL — ABNORMAL LOW (ref 8.9–10.3)
Chloride: 93 mmol/L — ABNORMAL LOW (ref 98–111)
Chloride: 95 mmol/L — ABNORMAL LOW (ref 98–111)
Creatinine, Ser: 1.91 mg/dL — ABNORMAL HIGH (ref 0.44–1.00)
Creatinine, Ser: 2.22 mg/dL — ABNORMAL HIGH (ref 0.44–1.00)
GFR, Estimated: 28 mL/min — ABNORMAL LOW (ref >60)
GFR, Estimated: 24 mL/min — ABNORMAL LOW (ref >60)
Glucose, Bld: 140 mg/dL — ABNORMAL HIGH (ref 70–99)
Glucose, Bld: 148 mg/dL — ABNORMAL HIGH (ref 70–99)
Potassium: 2.7 mmol/L — CL (ref 3.5–5.1)
Potassium: 3.8 mmol/L (ref 3.5–5.1)
Sodium: 136 mmol/L (ref 135–145)
Sodium: 136 mmol/L (ref 135–145)

## 2021-12-18 LAB — POCT I-STAT 7, (LYTES, BLD GAS, ICA,H+H)
Acid-Base Excess: 7 mmol/L — ABNORMAL HIGH (ref 0.0–2.0)
Bicarbonate: 31.2 mmol/L — ABNORMAL HIGH (ref 20.0–28.0)
Calcium, Ion: 1.15 mmol/L (ref 1.15–1.40)
HCT: 27 % — ABNORMAL LOW (ref 36.0–46.0)
Hemoglobin: 9.2 g/dL — ABNORMAL LOW (ref 12.0–15.0)
O2 Saturation: 100 %
Patient temperature: 97.8
Potassium: 3.4 mmol/L — ABNORMAL LOW (ref 3.5–5.1)
Sodium: 132 mmol/L — ABNORMAL LOW (ref 135–145)
TCO2: 33 mmol/L — ABNORMAL HIGH (ref 22–32)
pCO2 arterial: 44.1 mmHg (ref 32–48)
pH, Arterial: 7.457 — ABNORMAL HIGH (ref 7.35–7.45)
pO2, Arterial: 544 mmHg — ABNORMAL HIGH (ref 83–108)

## 2021-12-18 LAB — CBC
HCT: 28.4 % — ABNORMAL LOW (ref 36.0–46.0)
Hemoglobin: 9 g/dL — ABNORMAL LOW (ref 12.0–15.0)
MCH: 31.7 pg (ref 26.0–34.0)
MCHC: 31.7 g/dL (ref 30.0–36.0)
MCV: 100 fL (ref 80.0–100.0)
Platelets: 111 10*3/uL — ABNORMAL LOW (ref 150–400)
RBC: 2.84 MIL/uL — ABNORMAL LOW (ref 3.87–5.11)
RDW: 13.6 % (ref 11.5–15.5)
WBC: 15.9 10*3/uL — ABNORMAL HIGH (ref 4.0–10.5)
nRBC: 0 % (ref 0.0–0.2)

## 2021-12-18 LAB — GLUCOSE, CAPILLARY
Glucose-Capillary: 137 mg/dL — ABNORMAL HIGH (ref 70–99)
Glucose-Capillary: 154 mg/dL — ABNORMAL HIGH (ref 70–99)
Glucose-Capillary: 171 mg/dL — ABNORMAL HIGH (ref 70–99)
Glucose-Capillary: 124 mg/dL — ABNORMAL HIGH (ref 70–99)
Glucose-Capillary: 161 mg/dL — ABNORMAL HIGH (ref 70–99)

## 2021-12-18 LAB — C-REACTIVE PROTEIN: CRP: 11.5 mg/dL — ABNORMAL HIGH (ref <1.0)

## 2021-12-18 LAB — MAGNESIUM
Magnesium: 1.6 mg/dL — ABNORMAL LOW (ref 1.7–2.4)
Magnesium: 2.1 mg/dL (ref 1.7–2.4)

## 2021-12-18 LAB — VITAMIN D 25 HYDROXY (VIT D DEFICIENCY, FRACTURES): Vit D, 25-Hydroxy: 37.28 ng/mL (ref 30–100)

## 2021-12-18 LAB — PHOSPHORUS
Phosphorus: 2.6 mg/dL (ref 2.5–4.6)
Phosphorus: 3 mg/dL (ref 2.5–4.6)

## 2021-12-18 MED ORDER — NYSTATIN 100000 UNIT/ML MT SUSP
5.0000 mL | Freq: Four times a day (QID) | OROMUCOSAL | Status: DC
  Administered 2021-12-18 – 2021-12-23 (×24): 500000 [IU] via ORAL
  Filled 2021-12-18 (×25): qty 5

## 2021-12-18 MED ORDER — MIDAZOLAM HCL 2 MG/2ML IJ SOLN
INTRAMUSCULAR | Status: AC
  Filled 2021-12-18: qty 2

## 2021-12-18 MED ORDER — PHENYTOIN SODIUM 50 MG/ML IJ SOLN
100.0000 mg | Freq: Three times a day (TID) | INTRAMUSCULAR | Status: DC
  Administered 2021-12-18 – 2021-12-27 (×28): 100 mg via INTRAVENOUS
  Filled 2021-12-18 (×31): qty 2

## 2021-12-18 MED ORDER — ETOMIDATE 2 MG/ML IV SOLN
INTRAVENOUS | Status: AC
  Administered 2021-12-18: 20 mg via INTRAVENOUS
  Filled 2021-12-18: qty 10

## 2021-12-18 MED ORDER — FAMOTIDINE 20 MG PO TABS: 20.0000 mg | ORAL_TABLET | Freq: Two times a day (BID) | ORAL | Status: DC

## 2021-12-18 MED ORDER — SODIUM CHLORIDE 0.9 % IV SOLN
400.0000 mg | Freq: Once | INTRAVENOUS | Status: AC
  Administered 2021-12-18: 400 mg via INTRAVENOUS
  Filled 2021-12-18: qty 3.08

## 2021-12-18 MED ORDER — POTASSIUM CHLORIDE 10 MEQ/100ML IV SOLN
10.0000 meq | INTRAVENOUS | Status: AC
  Administered 2021-12-18 (×2): 10 meq via INTRAVENOUS
  Filled 2021-12-18 (×2): qty 100

## 2021-12-18 MED ORDER — PROPOFOL 1000 MG/100ML IV EMUL
5.0000 ug/kg/min | INTRAVENOUS | Status: DC
  Administered 2021-12-19: 50 ug/kg/min via INTRAVENOUS
  Administered 2021-12-19 (×4): 80 ug/kg/min via INTRAVENOUS
  Administered 2021-12-20: 50 ug/kg/min via INTRAVENOUS
  Administered 2021-12-20: 80 ug/kg/min via INTRAVENOUS
  Filled 2021-12-18 (×8): qty 100

## 2021-12-18 MED ORDER — SODIUM CHLORIDE 0.9 % IV SOLN
150.0000 mg | Freq: Two times a day (BID) | INTRAVENOUS | Status: DC
  Administered 2021-12-18 – 2021-12-27 (×19): 150 mg via INTRAVENOUS
  Filled 2021-12-18 (×20): qty 15

## 2021-12-18 MED ORDER — VITAL AF 1.2 CAL PO LIQD
1000.0000 mL | ORAL | Status: DC
  Administered 2021-12-18 – 2021-12-28 (×13): 1000 mL
  Filled 2021-12-18 (×5): qty 1000

## 2021-12-18 MED ORDER — FENTANYL CITRATE PF 50 MCG/ML IJ SOSY: 25.0000 ug | PREFILLED_SYRINGE | Freq: Once | INTRAMUSCULAR | Status: AC

## 2021-12-18 MED ORDER — FENTANYL CITRATE PF 50 MCG/ML IJ SOSY
PREFILLED_SYRINGE | INTRAMUSCULAR | Status: AC
  Administered 2021-12-18: 25 ug via INTRAVENOUS
  Filled 2021-12-18: qty 2

## 2021-12-18 MED ORDER — DOCUSATE SODIUM 50 MG/5ML PO LIQD
100.0000 mg | Freq: Two times a day (BID) | ORAL | Status: DC
  Administered 2021-12-18 – 2021-12-22 (×6): 100 mg
  Filled 2021-12-18 (×8): qty 10

## 2021-12-18 MED ORDER — SODIUM CHLORIDE 0.9 % IV SOLN
250.0000 mg | Freq: Once | INTRAVENOUS | Status: AC
  Administered 2021-12-18: 250 mg via INTRAVENOUS
  Filled 2021-12-18: qty 2.5

## 2021-12-18 MED ORDER — NOREPINEPHRINE 4 MG/250ML-% IV SOLN
0.0000 ug/min | INTRAVENOUS | Status: DC
  Administered 2021-12-18: 2 ug/min via INTRAVENOUS
  Administered 2021-12-19: 8 ug/min via INTRAVENOUS
  Administered 2021-12-19: 14 ug/min via INTRAVENOUS
  Administered 2021-12-19: 15 ug/min via INTRAVENOUS
  Administered 2021-12-19: 14 ug/min via INTRAVENOUS
  Administered 2021-12-20: 13 ug/min via INTRAVENOUS
  Administered 2021-12-20: 9 ug/min via INTRAVENOUS
  Administered 2021-12-20 (×2): 17 ug/min via INTRAVENOUS
  Administered 2021-12-20: 10 ug/min via INTRAVENOUS
  Administered 2021-12-21: 9 ug/min via INTRAVENOUS
  Filled 2021-12-18 (×10): qty 250

## 2021-12-18 MED ORDER — POLYETHYLENE GLYCOL 3350 17 G PO PACK
17.0000 g | PACK | Freq: Every day | ORAL | Status: DC
  Administered 2021-12-20 – 2021-12-21 (×2): 17 g
  Filled 2021-12-18 (×2): qty 1

## 2021-12-18 MED ORDER — POTASSIUM CHLORIDE 20 MEQ PO PACK
40.0000 meq | PACK | Freq: Once | ORAL | Status: AC
  Administered 2021-12-18: 40 meq
  Filled 2021-12-18: qty 2

## 2021-12-18 MED ORDER — SODIUM CHLORIDE 0.9 % IV SOLN
50.0000 mg | INTRAVENOUS | Status: DC
  Filled 2021-12-18: qty 5

## 2021-12-18 MED ORDER — ORAL CARE MOUTH RINSE
15.0000 mL | OROMUCOSAL | Status: DC
  Administered 2021-12-18 – 2021-12-21 (×30): 15 mL via OROMUCOSAL

## 2021-12-18 MED ORDER — FAMOTIDINE 20 MG PO TABS
10.0000 mg | ORAL_TABLET | Freq: Every day | ORAL | Status: DC
  Administered 2021-12-18 – 2021-12-31 (×14): 10 mg
  Filled 2021-12-18 (×14): qty 1

## 2021-12-18 MED ORDER — SODIUM CHLORIDE 0.9 % IV SOLN
10.0000 mg/kg | Freq: Once | INTRAVENOUS | Status: AC
  Administered 2021-12-18: 430 mg via INTRAVENOUS
  Filled 2021-12-18: qty 8.6

## 2021-12-18 MED ORDER — PHENYLEPHRINE 80 MCG/ML (10ML) SYRINGE FOR IV PUSH (FOR BLOOD PRESSURE SUPPORT): 80.0000 ug | PREFILLED_SYRINGE | Freq: Once | INTRAVENOUS | Status: AC | PRN

## 2021-12-18 MED ORDER — MAGNESIUM SULFATE 2 GM/50ML IV SOLN
2.0000 g | Freq: Once | INTRAVENOUS | Status: AC
  Administered 2021-12-18: 2 g via INTRAVENOUS
  Filled 2021-12-18: qty 50

## 2021-12-18 MED ORDER — ETOMIDATE 2 MG/ML IV SOLN: 20.0000 mg | Freq: Once | INTRAVENOUS | Status: AC

## 2021-12-18 MED ORDER — FENTANYL 2500MCG IN NS 250ML (10MCG/ML) PREMIX INFUSION
25.0000 ug/h | INTRAVENOUS | Status: DC
  Administered 2021-12-18: 25 ug/h via INTRAVENOUS
  Filled 2021-12-18 (×2): qty 250

## 2021-12-18 MED ORDER — FENTANYL BOLUS VIA INFUSION
25.0000 ug | INTRAVENOUS | Status: DC | PRN
  Administered 2021-12-21: 50 ug via INTRAVENOUS

## 2021-12-18 MED ORDER — PHENOBARBITAL NICU INJ SYRINGE 65 MG/ML: 400.0000 mg | INJECTION | Freq: Once | INTRAMUSCULAR | Status: DC

## 2021-12-18 MED ORDER — SODIUM CHLORIDE 0.9 % IV SOLN
50.0000 mg | INTRAVENOUS | Status: DC
  Administered 2021-12-19 – 2021-12-22 (×2): 50 mg via INTRAVENOUS
  Filled 2021-12-18 (×2): qty 5

## 2021-12-18 MED ORDER — ROCURONIUM BROMIDE 10 MG/ML (PF) SYRINGE: 100.0000 mg | PREFILLED_SYRINGE | Freq: Once | INTRAVENOUS | Status: AC

## 2021-12-18 MED ORDER — PROPOFOL 1000 MG/100ML IV EMUL
INTRAVENOUS | Status: AC
  Administered 2021-12-18: 20 ug/kg/min via INTRAVENOUS
  Filled 2021-12-18: qty 100

## 2021-12-18 MED ORDER — SODIUM CHLORIDE 0.9 % IV SOLN
250.0000 mg | INTRAVENOUS | Status: DC
  Administered 2021-12-19 – 2021-12-22 (×2): 250 mg via INTRAVENOUS
  Filled 2021-12-18 (×2): qty 2.5

## 2021-12-18 MED ORDER — ROCURONIUM BROMIDE 10 MG/ML (PF) SYRINGE
PREFILLED_SYRINGE | INTRAVENOUS | Status: AC
  Administered 2021-12-18: 70 mg via INTRAVENOUS
  Filled 2021-12-18: qty 10

## 2021-12-18 MED ORDER — CARVEDILOL 12.5 MG PO TABS
12.5000 mg | ORAL_TABLET | Freq: Two times a day (BID) | ORAL | Status: DC
  Administered 2021-12-18: 12.5 mg
  Filled 2021-12-18: qty 1

## 2021-12-18 MED ORDER — HYDRALAZINE HCL 25 MG PO TABS: 25.0000 mg | ORAL_TABLET | Freq: Three times a day (TID) | ORAL | Status: DC

## 2021-12-18 MED ORDER — RENA-VITE PO TABS
1.0000 | ORAL_TABLET | Freq: Every day | ORAL | Status: DC
  Administered 2021-12-18 – 2021-12-19 (×2): 1 via ORAL
  Filled 2021-12-18 (×2): qty 1

## 2021-12-18 MED ORDER — PHENOBARBITAL NICU INJ SYRINGE 65 MG/ML: 400.0000 mg | INJECTION | INTRAMUSCULAR | Status: DC

## 2021-12-18 MED ORDER — PHENYLEPHRINE 80 MCG/ML (10ML) SYRINGE FOR IV PUSH (FOR BLOOD PRESSURE SUPPORT)
PREFILLED_SYRINGE | INTRAVENOUS | Status: AC
  Administered 2021-12-18: 150 ug via INTRAVENOUS
  Filled 2021-12-18: qty 10

## 2021-12-18 NOTE — Progress Notes (Addendum)
PROGRESS NOTE    Erica [name]  GYJ:[id number] DOB: 02-27-1955 DOA: 12/15/2021 PCP: System, Provider Not In   Brief Narrative: 66 year old past medical history significant for diabetes, CHF, hypertension, facial, ESRD on hemodialysis was brought from nursing home with altered mental status, right-sided weakness and leftward gaze.  She was last known well around 11 AM when she was brought back to a SNF from colonoscopy, which was not performed).  On arrival to the ED she was found to have left-sided gaze, right side flaccid.  CT head negative.  TNKase was administered 1349 he was found to have hyponatremia hyperkalemia CCM admitted patient.  Patient admitted with breakthrough seizure in the setting of hyponatremia and electrolytes abnormality.  She is on continuous EEG, neurology following.    Assessment & Plan:   Principal Problem:   Altered mental status Active Problems:   ESRD (end stage renal disease) ([place])   1-Seizure:, Status Epilepticus.  Patient presented with altered mental status, left gaze preference and right hemiplegia, aphasic. CT head negative, CT angio negative for LVO. Patient received TNK by neurology on 10/30 MRI of the brain without evidence of acute stroke. Initially in setting of hyponatremia.  Patient on continuous EEG: Focal nonconvulsive status epilepticus, left posterior quadrant. Patient was loaded with IV Vimpat last night, neurology will add IV phenytoin Continue with IV Keppra  Hyponatremia:  Presented with sodium 119.  Corrected with HD.  Improved/   Acute metabolic encephalopathy In the setting of a non convulsive status epilepticus. Neurology adding medications for seizure.  She is alert, aphasic.   ESRD on hemodialysis Initial hyperkalemia Nephrology following.   Hypokalemia: replete oral and IV two runs.  Will check Bmet this afternoon.   HTN; on hydralazine.   Hypomagnesemia; replete IV.   Hypoglycemia; resolved, monitor.   Started on tube feeding.   Diabetes: SSI.   AV graft bleeding post TNK Resolved. Monitor hb.   Leukocytosis; trending down.  Severe Malnutrition in setting chronic illness.  Nutrition; on tube feeding.    Pressure Injury 12/17/21 Sacrum Stage 1 -  Intact skin with non-blanchable redness of a localized area usually over a bony prominence. (Active)  12/17/21 1600  Location: Sacrum  Location Orientation:   Staging: Stage 1 -  Intact skin with non-blanchable redness of a localized area usually over a bony prominence.  Wound Description (Comments):   Present on Admission:   Dressing Type Foam - Lift dressing to assess site every shift 12/17/21 2000     Nutrition Problem: Severe Malnutrition Etiology: chronic illness (ESRD, chronic pancreatitis, CHF)    Signs/Symptoms: severe fat depletion, severe muscle depletion    Interventions: Tube feeding, MVI  Estimated body mass index is 21.25 kg/m as calculated from the following:   Height as of an earlier encounter on 12/15/21: 4\' 8"  (1.422 m).   Weight as of this encounter: 43 kg.   DVT prophylaxis: Heparin Code Status: Full code Family Communication: neurology will update family Disposition Plan:  Status is: Inpatient Remains inpatient appropriate because: management of seizure, encephalopathy     Consultants:  Neurology CCM  Procedures:  Continuous EEG  Antimicrobials:    Subjective: She is alert, aphasic, only says "ok"  Objective: Vitals:   12/18/21 0530 12/18/21 0600 12/18/21 0630 12/18/21 0719  BP: (!) 169/60 (!) 174/55 (!) 172/44   Pulse: 87 87 90   Resp: 13 13 13    Temp:    98.4 F (36.9 C)  TempSrc:    Oral  SpO2: 100%  100% 100%   Weight:        Intake/Output Summary (Last 24 hours) at 12/18/2021 0802 Last data filed at 12/18/2021 0600 Gross per 24 hour  Intake 999.24 ml  Output 2175 ml  Net -1175.76 ml   Filed Weights   12/17/21 0500 12/17/21 2208 12/18/21 0214  Weight: 45.2 kg 42.8 kg  43 kg    Examination:  General exam: Appears calm and comfortable  Respiratory system: Clear to auscultation. Respiratory effort normal. Cardiovascular system: S1 & S2 heard, RRR.  Gastrointestinal system: Abdomen is nondistended, soft and nontender. No organomegaly or masses felt. Normal bowel sounds heard. Central nervous system: Alert, does not follows command, aphasic.  Extremities: no edema  Data Reviewed: I have personally reviewed following labs and imaging studies  CBC: Recent Labs  Lab 12/15/21 1325 12/15/21 1349 12/16/21 0213 12/16/21 0905 12/17/21 1740 12/18/21 0358  WBC 3.5*  --  8.8 9.0 19.3* 15.9*  NEUTROABS 2.0  --   --   --   --   --   HGB 12.3 15.0 9.3* 9.5* 8.5* 9.0*  HCT 39.0 44.0 29.2* 28.9* 27.2* 28.4*  MCV 100.0  --  100.3* 97.6 101.9* 100.0  PLT 112*  --  124* 130* 106* 702*   Basic Metabolic Panel: Recent Labs  Lab 12/15/21 2140 12/16/21 0212 12/16/21 0905 12/17/21 0409 12/17/21 1630 12/18/21 0358  NA 126* 128* 132* 133*  --  136  K 7.2* 6.1* 3.6 2.8*  --  2.7*  CL 88* 89* 91* 93*  --  93*  CO2 20* 21* 24 22  --  28  GLUCOSE 71 107* 120* 178*  --  140*  BUN 55* 57* 17 22  --  11  CREATININE 4.16* 4.45* 2.11* 3.05*  --  1.91*  CALCIUM 10.0 10.1 9.3 8.8*  --  9.2  MG  --   --   --   --  1.7 1.6*  PHOS 4.3 4.0  --   --  3.9 2.6   GFR: Estimated Creatinine Clearance: 16.4 mL/min (A) (by C-G formula based on SCr of 1.91 mg/dL (H)). Liver Function Tests: Recent Labs  Lab 12/15/21 1325 12/15/21 2140 12/16/21 0212  AST 41  --   --   ALT 45*  --   --   ALKPHOS 545*  --   --   BILITOT 0.8  --   --   PROT 9.0*  --   --   ALBUMIN 4.4 3.5 3.3*   No results for input(s): "LIPASE", "AMYLASE" in the last 168 hours. No results for input(s): "AMMONIA" in the last 168 hours. Coagulation Profile: Recent Labs  Lab 12/15/21 1325  INR 1.1   Cardiac Enzymes: No results for input(s): "CKTOTAL", "CKMB", "CKMBINDEX", "TROPONINI" in the last 168  hours. BNP (last 3 results) No results for input(s): "PROBNP" in the last 8760 hours. HbA1C: No results for input(s): "HGBA1C" in the last 72 hours. CBG: Recent Labs  Lab 12/17/21 1524 12/17/21 1933 12/17/21 2329 12/18/21 0329 12/18/21 0717  GLUCAP 82 109* 133* 137* 154*   Lipid Profile: No results for input(s): "CHOL", "HDL", "LDLCALC", "TRIG", "CHOLHDL", "LDLDIRECT" in the last 72 hours. Thyroid Function Tests: No results for input(s): "TSH", "T4TOTAL", "FREET4", "T3FREE", "THYROIDAB" in the last 72 hours. Anemia Panel: No results for input(s): "VITAMINB12", "FOLATE", "FERRITIN", "TIBC", "IRON", "RETICCTPCT" in the last 72 hours. Sepsis Labs: No results for input(s): "PROCALCITON", "LATICACIDVEN" in the last 168 hours.  Recent Results (from the past 240 hour(s))  MRSA Next Gen by PCR, Nasal     Status: None   Collection Time: 12/15/21  9:35 PM   Specimen: Nasal Mucosa; Nasal Swab  Result Value Ref Range Status   MRSA by PCR Next Gen NOT DETECTED NOT DETECTED Final    Comment: (NOTE) The GeneXpert MRSA Assay (FDA approved for NASAL specimens only), is one component of a comprehensive MRSA colonization surveillance program. It is not intended to diagnose MRSA infection nor to guide or monitor treatment for MRSA infections. Test performance is not FDA approved in patients less than 63 years old. Performed at [facility], [place] [address], [place], [place] [address]   Culture, blood (Routine X 2) w Reflex to ID Panel     Status: None (Preliminary result)   Collection Time: 12/16/21  6:51 PM   Specimen: BLOOD RIGHT HAND  Result Value Ref Range Status   Specimen Description BLOOD RIGHT HAND  Final   Special Requests   Final    BOTTLES DRAWN AEROBIC ONLY Blood Culture results may not be optimal due to an inadequate volume of blood received in culture bottles   Culture   Final    NO GROWTH 2 DAYS Performed at [facility], [place] [address], [place], [place]  [address]    Report Status PENDING  Incomplete  Culture, blood (Routine X 2) w Reflex to ID Panel     Status: None (Preliminary result)   Collection Time: 12/16/21  6:58 PM   Specimen: BLOOD RIGHT ARM  Result Value Ref Range Status   Specimen Description BLOOD RIGHT ARM  Final   Special Requests   Final    BOTTLES DRAWN AEROBIC ONLY Blood Culture results may not be optimal due to an inadequate volume of blood received in culture bottles   Culture   Final    NO GROWTH 2 DAYS Performed at [facility], [place] [address] [place],  [address]    Report Status PENDING  Incomplete         Radiology Studies: DG Abd Portable 1V  Result Date: 12/17/2021 CLINICAL DATA:  Feeding tube placement EXAM: PORTABLE ABDOMEN - 1 VIEW COMPARISON:  09/22/2020 FINDINGS: Weighted tip feeding tube is been advanced to the level of the pylorus . Stomach is decompressed. Bowel gas pattern unremarkable. Right femoral catheter extends up to the L5 level. Right hip arthroplasty component partially visualized. IMPRESSION: Feeding tube advanced to the pylorus . Electronically Signed   By: Lucrezia Europe M.D.   On: 12/17/2021 11:54   MR BRAIN WO CONTRAST  Result Date: 12/16/2021 CLINICAL DATA:  Neuro deficit, acute, stroke suspected EXAM: MRI HEAD WITHOUT CONTRAST TECHNIQUE: Multiplanar, multiecho pulse sequences of the brain and surrounding structures were obtained without intravenous contrast. COMPARISON:  CT head December 15, 2021. MRI head August 16, 2020. FINDINGS: Brain: No acute infarction, hemorrhage, hydrocephalus, extra-axial collection or mass lesion. Mild for age scattered T2/FLAIR hyperintensities in the white matter, nonspecific but compatible with chronic microvascular ischemic disease. Remote left parietal infarct. Vascular: Major arterial flow voids are maintained skull base. Skull and upper cervical spine: Normal marrow signal. Sinuses/Orbits: Clear sinuses.  No acute orbital findings. Other: No mastoid  effusions. IMPRESSION: No evidence of acute intracranial abnormality. Electronically Signed   By: Margaretha Sheffield M.D.   On: 12/16/2021 14:27   DG Chest Port 1 View  Result Date: 12/16/2021 CLINICAL DATA:  Altered mental status EXAM: PORTABLE CHEST 1 VIEW COMPARISON:  Radiograph 10/12/2021 FINDINGS: Mildly enlarged  cardiac silhouette. There is no focal airspace consolidation. There is no pleural effusion or pneumothorax. Prior right proximal humerus fixation. Bilateral shoulder degenerative changes. Thoracic spondylosis. IMPRESSION: No evidence of acute cardiopulmonary disease. Unchanged mild cardiomegaly. Electronically Signed   By: Maurine Simmering M.D.   On: 12/16/2021 10:13   EEG adult  Result Date: 12/16/2021 [name], MD     12/16/2021  8:42 AM Patient Name: Erica [name] MRN: [id number] Epilepsy Attending: [name] Referring Physician/Provider: Rigoberto Noel, MD Date: 12/16/2021 Duration: 23.25 mins Patient history: 66 yo F with right hemiplegia with leftward gaze and aphasia.EEG to evaluate for seizure Level of alertness: lethargic AEDs during EEG study: LEV Technical aspects: This EEG study was done with scalp electrodes positioned according to the 10-20 International system of electrode placement. Electrical activity was reviewed with band pass filter of 1-70Hz , sensitivity of 7 uV/mm, display speed of 68mm/sec with a 60Hz  notched filter applied as appropriate. EEG data were recorded continuously and digitally stored.  Video monitoring was available and reviewed as appropriate. Description: EEG showed continuous generalized 3 to 6 Hz theta-delta slowing. Hyperventilation and photic stimulation were not performed.   Of note, study was technically difficult due to significant myogenic artifact ABNORMALITY - Continuous slow, generalized IMPRESSION: This study is suggestive of moderate to severe diffuse encephalopathy, nonspecific etiology. No seizures or epileptiform discharges  were seen throughout the recording. Priyanka Barbra Sarks        Scheduled Meds:  carvedilol  25 mg Per Tube BID WC   Chlorhexidine Gluconate Cloth  6 each Topical [id number]   folic acid  1 mg Per Tube Q1200   heparin injection (subcutaneous)  5,000 Units Subcutaneous Q12H   hydrALAZINE  50 mg Per Tube Q8H   insulin aspart  0-6 Units Subcutaneous Q4H   multivitamin  1 tablet Oral QHS   mouth rinse  15 mL Mouth Rinse 4 times per day   pantoprazole (PROTONIX) IV  40 mg Intravenous QHS   sodium chloride flush  10-40 mL Intracatheter Q12H   Continuous Infusions:  sodium chloride 10 mL/hr at 12/18/21 0600   feeding supplement (OSMOLITE 1.2 CAL) 40 mL/hr at 12/18/21 0601   lacosamide (VIMPAT) IV     levETIRAcetam     levETIRAcetam     levETIRAcetam     potassium chloride 10 mEq (12/18/21 0801)     LOS: 3 days    Time spent: 35 minutes.     Elmarie Shiley, MD [facility]   If 7PM-7AM, please contact night-coverage [email or web address]  12/18/2021, 8:02 AM

## 2021-12-18 NOTE — Progress Notes (Signed)
[facility] Progress Note   Assessment/ Plan:   Assessment/Plan: 66 year old BF with multiple medical issues including ESRD-  now admitted with code stroke s/p TNK 1 new CVA- with lateral gaze and right sided weakness-  s/p TNK per neurology-  neuro checks ongoing.  EEG with nonconvulsive status, per neuro 2 ESRD: normally TTS at [place] 10/30, 11/1, will do off schedule until can get back on schedule.  Next HD Friday - VVS had been consulted, OK to use AVG away from areas of epidermolysis 3 Hypertension: is not an issue right now-  likely would prefer permissive hypertension  4. Anemia of ESRD: hgb actually looks good -  anticipate it will go down with time  5. Metabolic Bone Disease: continue calcitriol , resume auryxia once eating  6.  Bleeding AVG -  as above  7. Hyperkalemia-  resolved 8.  Dispo: needs to get pall care c/s    Subjective:    Seen in room.  EEG on at this point.  HD yesterday with 2.2L off,  used AVG.     Objective:   BP 116/63 (BP Location: Right Arm)   Pulse 79   Temp 97.8 F (36.6 C) (Oral)   Resp 12   Wt 43 kg   SpO2 100%   BMI 21.25 kg/m   Intake/Output Summary (Last 24 hours) at 12/18/2021 1226 Last data filed at 12/18/2021 1200 Gross per 24 hour  Intake 1563.24 ml  Output 2075 ml  Net -511.76 ml   Weight change: -2.4 kg  Physical Exam: Gen:NAD, sleeping and easily arousable CVS: RRR Resp: clear Abd: soft Ext: R sided weakness ACCESS: R fem catheter c/d/I, L AVG + T/B, no further bleeding.  + two blisters/ skin tears on AVG- see vascular note  Imaging: Overnight EEG with video  Result Date: 12/18/2021 [name], MD     12/18/2021  9:06 AM Patient Name: Erica [name] MRN: [id number] Epilepsy Attending: [name] Referring Physician/Provider: [name], MD Duration: 12/17/2021 1402 to 11/2/20230900  Patient history: 66 yo F with right hemiplegia with leftward gaze and aphasia.EEG to evaluate  for seizure  Level of alertness: lethargic  AEDs during EEG study: LEV, LCM  Technical aspects: This EEG study was done with scalp electrodes positioned according to the 10-20 International system of electrode placement. Electrical activity was reviewed with band pass filter of 1-70Hz , sensitivity of 7 uV/mm, display speed of 27mm/sec with a 60Hz  notched filter applied as appropriate. EEG data were recorded continuously and digitally stored.  Video monitoring was available and reviewed as appropriate.  Description: EEG showed lateralized periodic discharges at 1 Hz in left posterior quadrant which increased in frequency and morphology and involved left hemisphere as well as vertex region. No clinical signs were seen during this EEG pattern except at around 2125 on 12/17/2021 patient was noted to have right upper extremity jerking.  This EEG pattern was consistent with focal status epilepticus arising from left posterior quadrant which was mostly nonconvulsive except briefly at 2125 on 12/17/2021 when right upper extremity jerking was noted.  EEG also showed continuous generalized 3 to 4 Hz delta and delta slowing. Patient was unhooked from EEG on 12/18/2021 between 0011 to 0424. ABNORMALITY -Focal nonconvulsive status epilepticus, left posterior quadrant - Continuous slow, generalized  IMPRESSION: This study is of electrographic/nonconvulsive status epilepticus arising from left posterior quadrant.  Briefly at 2125 12/17/2021 patient was also noted to have right upper extremity  jerking consistent with focal motor seizure.  Initially the study was suggestive of severe diffuse encephalopathy, nonspecific etiology.  Priyanka Barbra Sarks   DG Abd Portable 1V  Result Date: 12/17/2021 CLINICAL DATA:  Feeding tube placement EXAM: PORTABLE ABDOMEN - 1 VIEW COMPARISON:  09/22/2020 FINDINGS: Weighted tip feeding tube is been advanced to the level of the pylorus . Stomach is decompressed. Bowel gas pattern unremarkable. Right  femoral catheter extends up to the L5 level. Right hip arthroplasty component partially visualized. IMPRESSION: Feeding tube advanced to the pylorus . Electronically Signed   By: Lucrezia Europe M.D.   On: 12/17/2021 11:54   MR BRAIN WO CONTRAST  Result Date: 12/16/2021 CLINICAL DATA:  Neuro deficit, acute, stroke suspected EXAM: MRI HEAD WITHOUT CONTRAST TECHNIQUE: Multiplanar, multiecho pulse sequences of the brain and surrounding structures were obtained without intravenous contrast. COMPARISON:  CT head December 15, 2021. MRI head August 16, 2020. FINDINGS: Brain: No acute infarction, hemorrhage, hydrocephalus, extra-axial collection or mass lesion. Mild for age scattered T2/FLAIR hyperintensities in the white matter, nonspecific but compatible with chronic microvascular ischemic disease. Remote left parietal infarct. Vascular: Major arterial flow voids are maintained skull base. Skull and upper cervical spine: Normal marrow signal. Sinuses/Orbits: Clear sinuses.  No acute orbital findings. Other: No mastoid effusions. IMPRESSION: No evidence of acute intracranial abnormality. Electronically Signed   By: Margaretha Sheffield M.D.   On: 12/16/2021 14:27    Labs: BMET Recent Labs  Lab 12/15/21 1325 12/15/21 1349 12/15/21 1615 12/15/21 2140 12/16/21 0212 12/16/21 0905 12/17/21 0409 12/17/21 1630 12/18/21 0358  NA 123* 121* 124* 126* 128* 132* 133*  --  136  K 7.2* 7.1* 6.2* 7.2* 6.1* 3.6 2.8*  --  2.7*  CL 89* 96* 90* 88* 89* 91* 93*  --  93*  CO2 21*  --  23 20* 21* 24 22  --  28  GLUCOSE 131* 130* 116* 71 107* 120* 178*  --  140*  BUN 55* 50* 55* 55* 57* 17 22  --  11  CREATININE 3.79* 4.40* 4.12* 4.16* 4.45* 2.11* 3.05*  --  1.91*  CALCIUM 9.8  --  9.5 10.0 10.1 9.3 8.8*  --  9.2  PHOS  --   --   --  4.3 4.0  --   --  3.9 2.6   CBC Recent Labs  Lab 12/15/21 1325 12/15/21 1349 12/16/21 0213 12/16/21 0905 12/17/21 1740 12/18/21 0358  WBC 3.5*  --  8.8 9.0 19.3* 15.9*  NEUTROABS 2.0  --    --   --   --   --   HGB 12.3   < > 9.3* 9.5* 8.5* 9.0*  HCT 39.0   < > 29.2* 28.9* 27.2* 28.4*  MCV 100.0  --  100.3* 97.6 101.9* 100.0  PLT 112*  --  124* 130* 106* 111*   < > = values in this interval not displayed.    Medications:     carvedilol  25 mg Per Tube BID WC   Chlorhexidine Gluconate Cloth  6 each Topical Q6834   folic acid  1 mg Per Tube Q1200   heparin injection (subcutaneous)  5,000 Units Subcutaneous Q12H   hydrALAZINE  50 mg Per Tube Q8H   insulin aspart  0-6 Units Subcutaneous Q4H   multivitamin  1 tablet Oral QHS   nystatin  5 mL Oral QID   mouth rinse  15 mL Mouth Rinse 4 times per day   pantoprazole (PROTONIX) IV  40 mg  Intravenous QHS   sodium chloride flush  10-40 mL Intracatheter Q12H    [name], MD 12/18/2021, 12:26 PM

## 2021-12-18 NOTE — Progress Notes (Signed)
Notified by Dr. Hortense Ramal of ongoing PLEDs. Increase propofol from 30-50. We will continue to monitor.  Goal is resolution of PLEDs and burst suppression.  May need increasing propofol further addition of Versed drip. Neurology to follow Bedside RN Abby notified.  -- Amie Portland, MD Neurologist [facility] Pager: [phone]

## 2021-12-18 NOTE — Progress Notes (Signed)
AVG is being accessed successfully.  Please call if any questions or concerns arise.  Vascular to sign off at this time.   Broadus John MD

## 2021-12-18 NOTE — Progress Notes (Signed)
vLTM maintenance   All impedances below 10k  No skin breakdown noted at A1  A2   FP1  FP2  01

## 2021-12-18 NOTE — Progress Notes (Signed)
NAME:  Erica [name], MRN:  [id number], DOB:  1956-02-10, LOS: 3 ADMISSION DATE:  12/15/2021, CONSULTATION DATE:  12/18/2021  REFERRING MD:  Ova Freshwater, ED PA , CHIEF COMPLAINT: Altered mental status  History of Present Illness:  66 year old female with hx DM, CHF, HTN, seizures, ESRD on HD brought in from nursing home with AMS, R sided weakness and leftward gaze.  She was last known well around 11 AM when she was brought back to SNF from colonoscopy (which was not preformed as she was found to have snuff in her mouth).  On arrival to ED, she had left-sided gaze, right side was flaccid.  Head CT was negative and no LVO. TNKase was administered at 1349 Labs showed hyponatremia and K of 7.2, PCCM consulted for admission, transfer to [facility] planned Per nursing, at her baseline she is not able to walk due to deconditioning but is able to move all 4 extremities  Pertinent  Medical History  ESRD on HD, left arm AV fistula Hypertension Seizure disorder, on Keppra Diabetes type 2, on insulin  Significant Hospital Events: Including procedures, antibiotic start and stop dates in addition to other pertinent events   R fem HD cath 10/31>>> CTA head/neck 10/30>>> 1. No emergent large vessel occlusion or proximal hemodynamically significant stenosis. 2. Normal CT perfusion. EEG 10/31>>> This study is suggestive of moderate to severe diffuse encephalopathy, nonspecific etiology. No seizures or epileptiform discharges were seen throughout the recording. MRI brain 10/31 >, no evidence for acute stroke, left cortical ribbon, question consistent with seizure EEG 10/31 > moderate diffuse encephalopathy, nonspecific.  No seizure activity, no epileptiform discharges  Interim History / Subjective:  Ongoing cEEG> remains in NCSE despite fosphenytoin, and phenobarbital load> neuro requesting intubation for burst suppression.  No clinical seizures  Objective   Blood pressure 102/60, pulse 73, temperature 97.8 F  (36.6 C), temperature source Oral, resp. rate 15, height 4\' 8"  (1.422 m), weight 43 kg, SpO2 100 %.    Vent Mode: PRVC FiO2 (%):  [100 %] 100 % Set Rate:  [15 bmp] 15 bmp Vt Set:  [340 mL] 340 mL PEEP:  [5 cmH20] 5 cmH20 Plateau Pressure:  [15 cmH20] 15 cmH20   Intake/Output Summary (Last 24 hours) at 12/18/2021 1606 Last data filed at 12/18/2021 1200 Gross per 24 hour  Intake 1330.35 ml  Output 1925 ml  Net -594.65 ml   Filed Weights   12/17/21 0500 12/17/21 2208 12/18/21 0214  Weight: 45.2 kg 42.8 kg 43 kg    Examination: s/p RSI with rocuronium General:  chronically ill and thin female lying in bed in NAD HEENT: MM pink/moist, ETT, cortrak, pupils 3/ very sluggish, anicteric  Neuro:  s/p sedation/ paralyzed CV: rr, no murmur, right femoral trialysis cath PULM:  MV supported breaths, CTA, no secretions GI: soft, bs+, ND Extremities: warm/dry, no LE edema, loss of muscle mass, LUE AVF +b/t Skin: no rashes   Labs> K 3.8, Mag 2.1, phos 3, WBC 15.9, Hgb 9, plts 111 CBG range 82> 171  Resolved Hospital Problem list   Hyperkalemia/ hypokalemia  Assessment & Plan:  Focal electrographic/ non-convulsive status epilepticus Acute metabolic encephalopathy  Hx Epilepsy  - presented w/ AMS, left gaze preference and right hemiplegia -head CT negative, CT angio negative for LVO, status post TNK by neurology 10/30.  MRI brain without any evidence of acute stroke 10/31.  Also hyponatremic on admit 119 PLAN  - appreciate Neurology assistance - s/p intubation for burst suppression> propofol per Neurology  guidance  - ongoing cEEG - AEDs per Neuro> s/p phenobarbital 400mg  x 1.  Increasing vimpat to 150mg  daily, phenytoin 100mg  q8hrs, keppra 500mg  daily w/ additional 250mg  after dialysis  - ativan prn clinical seizure - Blood pressure control, SBP <180 - Respiratory support as below/ above - Maintain neuro protective measures; goal for eurothermia, euglycemia, eunatermia, normoxia, and  PCO2 goal of 35-40 - Seizure/ aspiration precautions  - defer neurology if needs ASA ppx   Acute respiratory insufficiency related to above  PLAN - s/p intubation for airway protection - full MV support, LTVV 4-8cc/kg IBW with goal Pplat <30 and DP<15  - VAP prevention protocol/ PPI - PAD protocol for sedation> propofol, prn fentanyl > for burst suppression for now rather than RASS goal  - wean FiO2 as able for SpO2 >92%  - daily SAT & SBT when appropriate  - pending post intubation CXR/ ABG - will need SLP post extubation   ESRD on HD Hyponatremia> resolved with iHD Hypokalemia PLAN -  - per Nephrology  - ok per VVS to use AVG away from area of epidermolysis given> given prior bleeding  - s/p KCL replete with repeat K 3.8 - plans for repeat iHD 11/3 - trend renal indices  - replace electrolytes prn   Hypoglycemia  Hx DM PLAN -  - cont SSI sensitive/ renal, monitor for hypoglycemia   HTN  PLAN -  - BP ok for now, but become progressively hypotensive related to sedation/ AEDs.   - will reduce  hydralazine 50> 25mg  q8hrs, coreg 25> 12.5mg  BID with holding parameters  - NE prn if MAP> 65 - prn hydralazine for SBP> 180  Anemia  AV graft bleeding post TNK - resolved, call VS if needs arise PLAN -  - trend CBC, Hgb stable  Severe malnutrition in the setting of chronic illness PLAN- - TF  - PMT consult for overall GOC  Leukocytosis, improving Chronic Thrombocytopenia  PLAN- - trending down WBC, remains afebrile.  Likely reactive.  Monitor off abx - follow BCX> ngtd x 2 days   Best Practice (right click and "Reselect all SmartList Selections" daily)  Diet/type: NPO; TF per cortrak DVT prophylaxis: SCD GI prophylaxis: H2B Lines: Dialysis Catheter Foley:  N/A Code Status:  full code Last date of multidisciplinary goals of care discussion [NA]   Labs   CBC: Recent Labs  Lab 12/15/21 1325 12/15/21 1349 12/16/21 0213 12/16/21 0905 12/17/21 1740  12/18/21 0358  WBC 3.5*  --  8.8 9.0 19.3* 15.9*  NEUTROABS 2.0  --   --   --   --   --   HGB 12.3 15.0 9.3* 9.5* 8.5* 9.0*  HCT 39.0 44.0 29.2* 28.9* 27.2* 28.4*  MCV 100.0  --  100.3* 97.6 101.9* 100.0  PLT 112*  --  124* 130* 106* 111*    Basic Metabolic Panel: Recent Labs  Lab 12/15/21 2140 12/16/21 0212 12/16/21 0905 12/17/21 0409 12/17/21 1630 12/18/21 0358 12/18/21 1428  NA 126* 128* 132* 133*  --  136 136  K 7.2* 6.1* 3.6 2.8*  --  2.7* 3.8  CL 88* 89* 91* 93*  --  93* 95*  CO2 20* 21* 24 22  --  28 28  GLUCOSE 71 107* 120* 178*  --  140* 148*  BUN 55* 57* 17 22  --  11 15  CREATININE 4.16* 4.45* 2.11* 3.05*  --  1.91* 2.22*  CALCIUM 10.0 10.1 9.3 8.8*  --  9.2 8.8*  MG  --   --   --   --  1.7 1.6* 2.1  PHOS 4.3 4.0  --   --  3.9 2.6 3.0   GFR: Estimated Creatinine Clearance: 14.1 mL/min (A) (by C-G formula based on SCr of 2.22 mg/dL (H)). Recent Labs  Lab 12/16/21 0213 12/16/21 0905 12/17/21 1740 12/18/21 0358  WBC 8.8 9.0 19.3* 15.9*    Liver Function Tests: Recent Labs  Lab 12/15/21 1325 12/15/21 2140 12/16/21 0212  AST 41  --   --   ALT 45*  --   --   ALKPHOS 545*  --   --   BILITOT 0.8  --   --   PROT 9.0*  --   --   ALBUMIN 4.4 3.5 3.3*   No results for input(s): "LIPASE", "AMYLASE" in the last 168 hours. No results for input(s): "AMMONIA" in the last 168 hours.  ABG    Component Value Date/Time   PHART 7.272 (L) 11/09/2017 1030   PCO2ART 21.0 (L) 11/09/2017 1030   PO2ART 82.8 (L) 11/09/2017 1030   HCO3 10.3 (L) 09/17/2020 1958   TCO2 22 12/15/2021 1349   ACIDBASEDEF 17.9 (H) 09/17/2020 1958   O2SAT 73.4 09/17/2020 1958     Coagulation Profile: Recent Labs  Lab 12/15/21 1325  INR 1.1    Cardiac Enzymes: No results for input(s): "CKTOTAL", "CKMB", "CKMBINDEX", "TROPONINI" in the last 168 hours.  HbA1C: HbA1c, POC (controlled diabetic range)  Date/Time Value Ref Range Status  11/25/2020 02:40 PM 8.1 (A) 0.0 - 7.0 % Final    Hgb A1c MFr Bld  Date/Time Value Ref Range Status  10/16/2021 05:08 AM 5.5 4.8 - 5.6 % Final    Comment:    (NOTE) Pre diabetes:          5.7%-6.4%  Diabetes:              >6.4%  Glycemic control for   <7.0% adults with diabetes   06/19/2021 08:55 AM 6.4 (H) 4.8 - 5.6 % Final    Comment:    (NOTE) Pre diabetes:          5.7%-6.4%  Diabetes:              >6.4%  Glycemic control for   <7.0% adults with diabetes     CBG: Recent Labs  Lab 12/17/21 1933 12/17/21 2329 12/18/21 0329 12/18/21 0717 12/18/21 1113  GLUCAP 109* 133* 137* 154* 171*     Critical care time: 32 minutes     Kennieth Rad, MSN, AG-ACNP-BC [facility] 12/18/2021, 4:06 PM  See Amion for pager If no response to pager, please call PCCM consult pager After 7:00 pm call Elink

## 2021-12-18 NOTE — Progress Notes (Signed)
[facility] Progress Note Patient Name: Erica [name] DOB: November 12, 1955 MRN: [id number]   Date of Service  12/18/2021  HPI/Events of Note  Notified of K 2.5, Mg 1.6 Not scheduled for dialysis today  eICU Interventions  K 40 meqs per ordered Mag 1 g IVPB Further corrections as per nephrology     Intervention Category Intermediate Interventions: Electrolyte abnormality - evaluation and management  Judd Lien 12/18/2021, 6:22 AM

## 2021-12-18 NOTE — Procedures (Signed)
Intubation Procedure Note  CASONDRA GASCA  [id number]  1955/10/28  Date:12/18/21  Time:3:37 PM   Provider Performing:Navy Belay D Rollene Rotunda    Procedure: Intubation (44628)  Indication(s) Respiratory Failure  Consent Unable to obtain consent due to emergent nature of procedure.   Anesthesia Etomidate and Rocuronium   Time Out Verified patient identification, verified procedure, site/side was marked, verified correct patient position, special equipment/implants available, medications/allergies/relevant history reviewed, required imaging and test results available.   Sterile Technique Usual hand hygeine, masks, and gloves were used   Procedure Description Patient positioned in bed supine.  Sedation given as noted above.  Patient was intubated with endotracheal tube using Glidescope.  View was Grade 1 full glottis .  Number of attempts was 1.  Colorimetric CO2 detector was consistent with tracheal placement.   Complications/Tolerance None; patient tolerated the procedure well. Chest X-ray is ordered to verify placement.   EBL Minimal   Specimen(s) None  JD Rexene Agent [facility] 12/18/2021, 3:38 PM  Please see [email or web address] for pager details.  From 7A-7P if no response, please call [phone]. After hours, please call ELink [phone].

## 2021-12-18 NOTE — Procedures (Addendum)
Patient Name: Erica [name]  MRN: [id number]  Epilepsy Attending: [name]  Referring Physician/Provider: [name], MD  Duration: 12/17/2021 1402 to 12/18/2021 1830   Patient history: 66 yo F with right hemiplegia with leftward gaze and aphasia.EEG to evaluate for seizure   Level of alertness: lethargic    AEDs during EEG study: LEV, LCM, PHT, Phenobarb   Technical aspects: This EEG study was done with scalp electrodes positioned according to the 10-20 International system of electrode placement. Electrical activity was reviewed with band pass filter of 1-70Hz , sensitivity of 7 uV/mm, display speed of 55mm/sec with a 60Hz  notched filter applied as appropriate. EEG data were recorded continuously and digitally stored.  Video monitoring was available and reviewed as appropriate.   Description: EEG showed lateralized periodic discharges at 1 Hz in left posterior quadrant which increased in frequency and morphology and involved left hemisphere as well as vertex region. No clinical signs were seen during this EEG pattern except at around 2125 on 12/17/2021 patient was noted to have right upper extremity jerking.  This EEG pattern was consistent with focal status epilepticus arising from left posterior quadrant which was mostly nonconvulsive except briefly at 2125 on 12/17/2021 when right upper extremity jerking was noted.  EEG also showed continuous generalized 3 to 4 Hz delta and delta slowing.  At around 1536, patient was intubated and started on propofol.  Subsequently, EEG showed continued to show focal nonconvulsive status epilepticus arising from left posterior quadrant  Patient was unhooked from EEG on 12/18/2021 between 0011 to 0424.   ABNORMALITY -Focal nonconvulsive status epilepticus, left posterior quadrant  - Continuous slow, generalized   IMPRESSION: This study showed evidence of electrographic/nonconvulsive status epilepticus arising from left posterior quadrant.   Briefly at 2125 12/17/2021 patient was also noted to have right upper extremity jerking consistent with focal motor seizure. There was also severe diffuse encephalopathy, nonspecific etiology.    [name] Barbra Sarks

## 2021-12-18 NOTE — Progress Notes (Signed)
LTM maint complete - no skin breakdown  under fp1 Fp2 A2 Serviced O2 Pz Cz P8 A2  [facility] monitored, Event button test confirmed by [facility].

## 2021-12-18 NOTE — Progress Notes (Addendum)
Subjective: In non-convulsive status epilepticus.  Vimpat was added without significant change in EEG  ROS: Unable to obtain due to poor mental status  Examination  Vital signs in last 24 hours: Temp:  [97.4 F (36.3 C)-98.4 F (36.9 C)] 97.8 F (36.6 C) (11/02 1115) Pulse Rate:  [77-94] 79 (11/02 1200) Resp:  [10-25] 12 (11/02 1200) BP: (66-178)/(37-149) 116/63 (11/02 1200) SpO2:  [96 %-100 %] 100 % (11/02 1200) Weight:  [42.8 kg-43 kg] 43 kg (11/02 0214)  General: lying in bed, NAD  Neuro: Awake, looks at examiner but does not follow commands, states "aww" with noxious stimulation, PERRLA, no forced gaze deviation, moving all 4 extremities with right hemiparesis   Basic Metabolic Panel: Recent Labs  Lab 12/15/21 2140 12/16/21 0212 12/16/21 0905 12/17/21 0409 12/17/21 1630 12/18/21 0358  NA 126* 128* 132* 133*  --  136  K 7.2* 6.1* 3.6 2.8*  --  2.7*  CL 88* 89* 91* 93*  --  93*  CO2 20* 21* 24 22  --  28  GLUCOSE 71 107* 120* 178*  --  140*  BUN 55* 57* 17 22  --  11  CREATININE 4.16* 4.45* 2.11* 3.05*  --  1.91*  CALCIUM 10.0 10.1 9.3 8.8*  --  9.2  MG  --   --   --   --  1.7 1.6*  PHOS 4.3 4.0  --   --  3.9 2.6    CBC: Recent Labs  Lab 12/15/21 1325 12/15/21 1349 12/16/21 0213 12/16/21 0905 12/17/21 1740 12/18/21 0358  WBC 3.5*  --  8.8 9.0 19.3* 15.9*  NEUTROABS 2.0  --   --   --   --   --   HGB 12.3 15.0 9.3* 9.5* 8.5* 9.0*  HCT 39.0 44.0 29.2* 28.9* 27.2* 28.4*  MCV 100.0  --  100.3* 97.6 101.9* 100.0  PLT 112*  --  124* 130* 106* 111*     Coagulation Studies: Recent Labs    12/15/21 1325  LABPROT 13.7  INR 1.1    Imaging No new brain imaging overnight  ASSESSMENT AND PLAN:66 year old female with history of epilepsy who presented with breakthrough seizures in the setting of hyponatremia (sodium 119).  MRI brain showed restricted diffusion/cortical reddening and left parieto-occipital region  Focal electrographic/nonconvulsive status  epilepticus, refractory Epilepsy with breakthrough seizure -Etiology: Hyponatremia with sodium 119.  No evidence of infection (no leukocytosis, afebrile) - Its unclear what patient's AED dosing is. She was last seen by our team in 2020 and at that time was discharged on Dilantin 150 mg twice daily, Topamax 25 mg twice daily and Keppra 1000mg  daily with additional 500 mg postdialysis.  However during current admission it appears that patient was only on Keppra 500 mg daily   Recommendations -Loaded with IV fosphenytoin at 10 mcg/h this morning.  However, seizures did not resolve.  Will load with phenobarbital 400 mg once -Start phenytoin 100 mg every 8 hours -Increase Vimpat to 150 mg twice daily -Continue Keppra 500 mg daily with additional dose of 250 mg after dialysis -Continue LTM EEG  -Called and spoke with patient's son.  Described that patient has been in refractory status epilepticus. However, because this is focal nonconvulsive status epilepticus, I been trying to slowly increase medications gradually and avoid intubation.  However, if phenobarbital does not work, will likely need to proceed with intubation and burst suppression.  Patient's son in agreement. -Discussed plan with Dr. Lamonte Sakai. -Continue seizure precautions -As needed IV Ativan  2 mg for clinical seizure-like activity  ADDENDUM - Still seizing after phenobarb. Will proceed with intubation and start Propofol @10mch /hr, titrate to burst suppression   CRITICAL CARE Performed by: [name]   Total critical care time: 45 minutes  Critical care time was exclusive of separately billable procedures and treating other patients.  Critical care was necessary to treat or prevent imminent or life-threatening deterioration.  Critical care was time spent personally by me on the following activities: development of treatment plan with patient and/or surrogate as well as nursing, discussions with consultants, evaluation of  patient's response to treatment, examination of patient, obtaining history from patient or surrogate, ordering and performing treatments and interventions, ordering and review of laboratory studies, ordering and review of radiographic studies, pulse oximetry and re-evaluation of patient's condition.     Zeb Comfort Epilepsy [facility] For questions after 5pm please refer to AMION to reach the Neurologist on call

## 2021-12-18 NOTE — Progress Notes (Addendum)
Brief Nutrition Note  RD received consult to evaluate tube feeding regimen as pt having diarrhea. MD asking to add fiber. Pt with history of chronic pancreatitis and chronic diarrhea per review of notes. Pt was having diarrhea requiring rectal tube prior to initiation of tube feeds yesterday. Given history, diarrhea highly unlikely to improve just with addition of fiber. Osmolite 1.2 tube feeds currently infusing at 40 ml/hr (goal rate 55 ml/hr).  Discussed the above with MD and RN. Will try transitioning pt to hydrolyzed, peptide-based tube feeding formula (Vital AF 1.2) and monitor for improvement in diarrhea. If diarrhea does not improve, can consider special ordering Vivonex RTF (only 10% kcal from fat to support tolerance and reduce pancreatic stimulation).  Will adjust tube feeding regimen: - Start Vital AF 1.2 @ 40 ml/hr and advance by 10 ml q 8 hours to goal rate of 55 ml/hr (1320 ml/day)  Tube feeding regimen at goal rate provides 1584 kcal, 99 grams of protein, and 1071 ml of H2O.   Continue to monitor magnesium, potassium, and phosphorus BID for at least 3 days, MD to replete as needed, as pt is at risk for refeeding syndrome given severe malnutrition.   - Renal MVI daily per tube   - Awaiting results of zinc, thiamine, vitamin C, vitamin A, vitamin E, and vitamin K labs  RD will continue to follow pt during admission.   Gustavus Bryant, MS, RD, LDN Inpatient Clinical Dietitian Please see AMiON for contact information.

## 2021-12-19 DIAGNOSIS — G40901 Epilepsy, unspecified, not intractable, with status epilepticus: Secondary | ICD-10-CM | POA: Diagnosis not present

## 2021-12-19 LAB — BASIC METABOLIC PANEL
Anion gap: 10 (ref 5–15)
BUN: 20 mg/dL (ref 8–23)
CO2: 24 mmol/L (ref 22–32)
Calcium: 8.4 mg/dL — ABNORMAL LOW (ref 8.9–10.3)
Chloride: 98 mmol/L (ref 98–111)
Creatinine, Ser: 2.5 mg/dL — ABNORMAL HIGH (ref 0.44–1.00)
GFR, Estimated: 21 mL/min — ABNORMAL LOW (ref >60)
Glucose, Bld: 111 mg/dL — ABNORMAL HIGH (ref 70–99)
Potassium: 3.6 mmol/L (ref 3.5–5.1)
Sodium: 132 mmol/L — ABNORMAL LOW (ref 135–145)

## 2021-12-19 LAB — GLUCOSE, CAPILLARY
Glucose-Capillary: 106 mg/dL — ABNORMAL HIGH (ref 70–99)
Glucose-Capillary: 134 mg/dL — ABNORMAL HIGH (ref 70–99)
Glucose-Capillary: 137 mg/dL — ABNORMAL HIGH (ref 70–99)
Glucose-Capillary: 238 mg/dL — ABNORMAL HIGH (ref 70–99)
Glucose-Capillary: 239 mg/dL — ABNORMAL HIGH (ref 70–99)
Glucose-Capillary: 268 mg/dL — ABNORMAL HIGH (ref 70–99)
Glucose-Capillary: 99 mg/dL (ref 70–99)

## 2021-12-19 LAB — CBC
HCT: 31.5 % — ABNORMAL LOW (ref 36.0–46.0)
Hemoglobin: 9.9 g/dL — ABNORMAL LOW (ref 12.0–15.0)
MCH: 31.4 pg (ref 26.0–34.0)
MCHC: 31.4 g/dL (ref 30.0–36.0)
MCV: 100 fL (ref 80.0–100.0)
Platelets: 162 10*3/uL (ref 150–400)
RBC: 3.15 MIL/uL — ABNORMAL LOW (ref 3.87–5.11)
RDW: 13.6 % (ref 11.5–15.5)
WBC: 13 10*3/uL — ABNORMAL HIGH (ref 4.0–10.5)
nRBC: 0.2 % (ref 0.0–0.2)

## 2021-12-19 LAB — MAGNESIUM
Magnesium: 2.2 mg/dL (ref 1.7–2.4)
Magnesium: 1.7 mg/dL (ref 1.7–2.4)

## 2021-12-19 LAB — PHOSPHORUS
Phosphorus: 3.3 mg/dL (ref 2.5–4.6)
Phosphorus: 1.8 mg/dL — ABNORMAL LOW (ref 2.5–4.6)

## 2021-12-19 LAB — TROPONIN I (HIGH SENSITIVITY)
Troponin I (High Sensitivity): 55 ng/L — ABNORMAL HIGH (ref <18)
Troponin I (High Sensitivity): 30 ng/L — ABNORMAL HIGH (ref <18)

## 2021-12-19 LAB — TRIGLYCERIDES: Triglycerides: 114 mg/dL (ref <150)

## 2021-12-19 MED ORDER — MIDAZOLAM HCL 2 MG/2ML IJ SOLN
10.0000 mg | Freq: Once | INTRAMUSCULAR | Status: AC
  Administered 2021-12-19: 10 mg via INTRAVENOUS
  Filled 2021-12-19: qty 10

## 2021-12-19 MED ORDER — MIDAZOLAM-SODIUM CHLORIDE 100-0.9 MG/100ML-% IV SOLN
10.0000 mg/h | INTRAVENOUS | Status: DC
  Administered 2021-12-19 – 2021-12-20 (×4): 10 mg/h via INTRAVENOUS
  Filled 2021-12-19 (×4): qty 100

## 2021-12-19 NOTE — Progress Notes (Signed)
NAME:  Erica [name], MRN:  [id number], DOB:  05-04-1955, LOS: 4 ADMISSION DATE:  12/15/2021, CONSULTATION DATE:  12/19/2021 REFERRING MD:  Ova Freshwater, ED PA , CHIEF COMPLAINT: Altered mental status  History of Present Illness:  66 year old female with hx DM, CHF, HTN, seizures, ESRD on HD brought in from nursing home with AMS, R sided weakness and leftward gaze 10/30.  She was last known well around 11 AM when she was brought back to SNF from colonoscopy (which was not preformed as she was found to have snuff in her mouth).  On arrival to ED, she had left-sided gaze, right side was flaccid.  Head CT was negative and no LVO. TNKase was administered at 1349 Labs showed hyponatremia and K of 7.2, PCCM consulted for admission, transfer to [facility] planned. Per nursing, at her baseline she is not able to walk due to deconditioning but is able to move all 4 extremities.   Pertinent  Medical History  ESRD on HD, left arm AV fistula Hypertension Seizure disorder, on Keppra Diabetes type 2, on insulin  Significant Hospital Events: Including procedures, antibiotic start and stop dates in addition to other pertinent events   R fem HD cath 10/31>>> CTA head/neck 10/30>>> 1. No emergent large vessel occlusion or proximal hemodynamically significant stenosis. 2. Normal CT perfusion. EEG 10/31>>> This study is suggestive of moderate to severe diffuse encephalopathy, nonspecific etiology. No seizures or epileptiform discharges were seen throughout the recording. MRI brain 10/31 >, no evidence for acute stroke, left cortical ribbon, question consistent with seizure EEG 10/31 > moderate diffuse encephalopathy, nonspecific.  No seizure activity, no epileptiform discharges  Interim History / Subjective:  Ongoing seizure activity on EEG despite escalating AED. Intubated 11/2 for initiation of burst suppression.   Objective   Blood pressure (!) 127/37, pulse 82, temperature (!) 101.7 F (38.7 C), resp. rate 15,  height 4\' 8"  (1.422 m), weight 43 kg, SpO2 97 %.    Vent Mode: PRVC FiO2 (%):  [30 %-100 %] 30 % Set Rate:  [15 bmp] 15 bmp Vt Set:  [340 mL] 340 mL PEEP:  [5 cmH20] 5 cmH20 Plateau Pressure:  [15 cmH20-16 cmH20] 15 cmH20   Intake/Output Summary (Last 24 hours) at 12/19/2021 1319 Last data filed at 12/19/2021 1200 Gross per 24 hour  Intake 2672.94 ml  Output 1900 ml  Net 772.94 ml   Filed Weights   12/17/21 0500 12/17/21 2208 12/18/21 0214  Weight: 45.2 kg 42.8 kg 43 kg   Examination: General:  Ill appearing female, lying in bed  HEENT: pupils 2 mm sluggish  Neuro:  sedated, no motor movement noted with physical stimulation/nailbed pressure  CV: rr, no murmur, right femoral trialysis cath PULM:  clear breath sounds GI: soft, active bowel sounds, non-distended  Extremities: warm/dry, no LE edema, loss of muscle mass, LUE AVF +b/t Skin: no rashes   Resolved Hospital Problem list   Hyperkalemia/ hypokalemia  Assessment & Plan:   Focal electrographic/ non-convulsive status epilepticus Acute metabolic encephalopathy  Hx Epilepsy  - presented w/ AMS, left gaze preference and right hemiplegia -head CT negative, CT angio negative for LVO, status post TNK by neurology 10/30.  MRI brain without any evidence of acute stroke 10/31.  Also hyponatremic on admit 119 - MRI negative 10/31  PLAN  - Neurology following  - s/p intubation for burst suppression> propofol and Versed per Neurology guidance  - ongoing cEEG - AEDs per Neuro> s/p phenobarbital 400mg  x 1.  Vimpat to 150mg   daily (additional 50 after dialysis), phenytoin 100mg  q8hrs, keppra 500mg  daily w/ additional 250mg  after dialysis  - ativan prn clinical seizure - Blood pressure control, SBP <180 - Respiratory support as below/ above - Maintain neuro protective measures; goal for eurothermia, euglycemia, eunatermia, normoxia, and PCO2 goal of 35-40 - Seizure/ aspiration precautions  - defer neurology if needs ASA ppx    Acute respiratory insufficiency related to above  - s/p intubation for airway protection Plan - full MV support, LTVV 4-8cc/kg IBW with goal Pplat <30 and DP<15  - VAP prevention protocol/ PPI - wean FiO2 as able for SpO2 >92%  - Unable to SBT due to burst suppression   ESRD on HD Hyponatremia> resolved with iHD Hypokalemia PLAN  - per Nephrology s/p ihd this AM with 1800 ml removed  - ok per VVS to use AVG away from area of epidermolysis given> given prior bleeding  - trend renal indices  - replace electrolytes prn   Hypoglycemia  Hx DM PLAN -  - cont SSI sensitive/ renal, monitor for hypoglycemia   Hypotension in setting of increased sedation needs H/O HTN  PLAN -  - Cardiac Monitoring - Titrate Levophed for MAP > 65  - Hold Coreg and Hydrazine   Anemia  AV graft bleeding post TNK - resolved, call VS if needs arise PLAN -  - trend CBC, Hgb stable  Severe malnutrition in the setting of chronic illness PLAN- - TF  - PMT consult for overall GOC  Leukocytosis, improving - Cultures 10/31 negative  Chronic Thrombocytopenia  PLAN- - Trend WBC and Fever Curve   Best Practice (right click and "Reselect all SmartList Selections" daily)  Diet/type: NPO; TF per cortrak DVT prophylaxis: SCD GI prophylaxis: H2B Lines: Dialysis Catheter Foley:  N/A Code Status:  full code Last date of multidisciplinary goals of care discussion [NA]  Labs   CBC: Recent Labs  Lab 12/15/21 1325 12/15/21 1349 12/16/21 0213 12/16/21 0905 12/17/21 1740 12/18/21 0358 12/18/21 1645 12/19/21 0550  WBC 3.5*  --  8.8 9.0 19.3* 15.9*  --  13.0*  NEUTROABS 2.0  --   --   --   --   --   --   --   HGB 12.3   < > 9.3* 9.5* 8.5* 9.0* 9.2* 9.9*  HCT 39.0   < > 29.2* 28.9* 27.2* 28.4* 27.0* 31.5*  MCV 100.0  --  100.3* 97.6 101.9* 100.0  --  100.0  PLT 112*  --  124* 130* 106* 111*  --  162   < > = values in this interval not displayed.    Basic Metabolic Panel: Recent Labs  Lab  12/16/21 0212 12/16/21 0905 12/17/21 0409 12/17/21 1630 12/18/21 0358 12/18/21 1428 12/18/21 1645 12/19/21 0339  NA 128* 132* 133*  --  136 136 132* 132*  K 6.1* 3.6 2.8*  --  2.7* 3.8 3.4* 3.6  CL 89* 91* 93*  --  93* 95*  --  98  CO2 21* 24 22  --  28 28  --  24  GLUCOSE 107* 120* 178*  --  140* 148*  --  111*  BUN 57* 17 22  --  11 15  --  20  CREATININE 4.45* 2.11* 3.05*  --  1.91* 2.22*  --  2.50*  CALCIUM 10.1 9.3 8.8*  --  9.2 8.8*  --  8.4*  MG  --   --   --  1.7 1.6* 2.1  --  2.2  PHOS 4.0  --   --  3.9 2.6 3.0  --  3.3   GFR: Estimated Creatinine Clearance: 12.5 mL/min (A) (by C-G formula based on SCr of 2.5 mg/dL (H)). Recent Labs  Lab 12/16/21 0905 12/17/21 1740 12/18/21 0358 12/19/21 0550  WBC 9.0 19.3* 15.9* 13.0*    Liver Function Tests: Recent Labs  Lab 12/15/21 1325 12/15/21 2140 12/16/21 0212  AST 41  --   --   ALT 45*  --   --   ALKPHOS 545*  --   --   BILITOT 0.8  --   --   PROT 9.0*  --   --   ALBUMIN 4.4 3.5 3.3*   No results for input(s): "LIPASE", "AMYLASE" in the last 168 hours. No results for input(s): "AMMONIA" in the last 168 hours.  ABG    Component Value Date/Time   PHART 7.457 (H) 12/18/2021 1645   PCO2ART 44.1 12/18/2021 1645   PO2ART 544 (H) 12/18/2021 1645   HCO3 31.2 (H) 12/18/2021 1645   TCO2 33 (H) 12/18/2021 1645   ACIDBASEDEF 17.9 (H) 09/17/2020 1958   O2SAT 100 12/18/2021 1645     Coagulation Profile: Recent Labs  Lab 12/15/21 1325  INR 1.1    Cardiac Enzymes: No results for input(s): "CKTOTAL", "CKMB", "CKMBINDEX", "TROPONINI" in the last 168 hours.  HbA1C: HbA1c, POC (controlled diabetic range)  Date/Time Value Ref Range Status  11/25/2020 02:40 PM 8.1 (A) 0.0 - 7.0 % Final   Hgb A1c MFr Bld  Date/Time Value Ref Range Status  10/16/2021 05:08 AM 5.5 4.8 - 5.6 % Final    Comment:    (NOTE) Pre diabetes:          5.7%-6.4%  Diabetes:              >6.4%  Glycemic control for   <7.0% adults with  diabetes   06/19/2021 08:55 AM 6.4 (H) 4.8 - 5.6 % Final    Comment:    (NOTE) Pre diabetes:          5.7%-6.4%  Diabetes:              >6.4%  Glycemic control for   <7.0% adults with diabetes     CBG: Recent Labs  Lab 12/18/21 1936 12/19/21 0002 12/19/21 0320 12/19/21 0802 12/19/21 1149  GLUCAP 161* 137* 106* 99 134*     Critical care time: 35 minutes    CRITICAL CARE Performed by: Omar Person   Total critical care time: 35 minutes  Critical care time was exclusive of separately billable procedures and treating other patients.  Critical care was necessary to treat or prevent imminent or life-threatening deterioration.  Critical care was time spent personally by me on the following activities: development of treatment plan with patient and/or surrogate as well as nursing, discussions with consultants, evaluation of patient's response to treatment, examination of patient, obtaining history from patient or surrogate, ordering and performing treatments and interventions, ordering and review of laboratory studies, ordering and review of radiographic studies, pulse oximetry and re-evaluation of patient's condition.

## 2021-12-19 NOTE — Progress Notes (Signed)
vLTM maintenance   All impedances below 10kohms.  No skin breakdown noted at all skin sites 

## 2021-12-19 NOTE — Progress Notes (Signed)
Versed now up to 80.  EEG reviewed-some burst suppression but not ideal at this time. I will add Versed.  -- Amie Portland, MD Neurologist [facility] Pager: [phone]

## 2021-12-19 NOTE — Progress Notes (Signed)
eLink Physician-Brief Progress Note Patient Name: Erica [name] DOB: [birth year] MRN: [id number]   Date of Service  12/19/2021  HPI/Events of Note  RN reports pt temp is 93.6.  Bair hugger applied, asking for order  eICU Interventions  Retail banker ordered Trop 55 in the background of renal insufficiency. Trend troponin and consider anticoagulation if with exponential increase     Intervention Category Minor Interventions: Routine modifications to care plan (e.g. PRN medications for pain, fever)  Shona Needles Heyli Min 12/19/2021, 5:46 AM

## 2021-12-19 NOTE — Procedures (Signed)
Patient Name: Erica [name]  MRN: [id number]  Epilepsy Attending: [name]  Referring Physician/Provider: [name], MD  Duration: 12/18/2021 1830 to 12/19/2021 1830   Patient history: 66 yo F with right hemiplegia with leftward gaze and aphasia.EEG to evaluate for seizure   Level of alertness: comatose    AEDs during EEG study: LEV, LCM, PHT, Phenobarb, Propofol, Versed   Technical aspects: This EEG study was done with scalp electrodes positioned according to the 10-20 International system of electrode placement. Electrical activity was reviewed with band pass filter of 1-70Hz , sensitivity of 7 uV/mm, display speed of 57mm/sec with a 60Hz  notched filter applied as appropriate. EEG data were recorded continuously and digitally stored.  Video monitoring was available and reviewed as appropriate.   Description: At the beginning of the study, EEG showed focal status epilepticus arising from left posterior quadrant. As sedation was gradually increased, EEG showed lateralized periodic discharges with overriding fast activity ( LPD +F) at 1 to 1.5 Hz, which at times appears rhythmic lasting 5 to 9 seconds consistent with brief ictal-interictal rhythmic discharges ( BIRDS).  On 12/20/2018 after around 4:30 AM, Versed infusion was started after which EEG showed burst suppression pattern with bursts of sharply contoured 3 to 7 Hz theta-delta slowing admixed with 15 to 18 Hz sharply contoured activity predominantly in right hemisphere lasting 4 to 6 seconds alternating with EEG suppression lasting about 30 seconds  ABNORMALITY -Focal nonconvulsive status epilepticus, left posterior quadrant  -Burst suppression, generalized  IMPRESSION: This study initially showed evidence of electrographic/nonconvulsive status epilepticus arising from left posterior quadrant. Gradually as medications were adjusted, status epilepticus resolved and EEG was suggestive of profound diffuse encephalopathy, most  likely related to sedation.  Karaline Buresh Barbra Sarks

## 2021-12-19 NOTE — Procedures (Signed)
Patient seen and examined on Hemodialysis. BP (!) 105/47   Pulse 73   Temp 98.6 F (37 C)   Resp 15   Ht 4\' 8"  (1.422 m)   Wt 43 kg   SpO2 100%   BMI 21.25 kg/m   QB 400 mL/ min via AVG, UF goal 2L  Tolerating treatment without complaints at this time.  [name] MD [facility] Pgr [phone] 11:17 AM

## 2021-12-19 NOTE — Progress Notes (Signed)
eLink Physician-Brief Progress Note Patient Name: Erica [name] DOB: 1955/04/28 MRN: [id number]   Date of Service  12/19/2021  HPI/Events of Note  RN reports pt tele monitor ringing out w/STE I, aVL.  EKG obtained & in Epic for your review.  Intubated & sedated, unable to assess for symptoms  eICU Interventions  EKG without STEMI but with non-specific T wave inversions Will obtain troponins     Intervention Category Intermediate Interventions: Other:  Judd Lien 12/19/2021, 3:02 AM

## 2021-12-19 NOTE — Progress Notes (Signed)
[facility] Progress Note   Assessment/ Plan:   Assessment/Plan: 66 year old BF with multiple medical issues including ESRD-  now admitted with code stroke s/p TNK 1 new CVA- with lateral gaze and right sided weakness-  s/p TNK per neurology.  EEG with nonconvulsive status, per neuro 2 ESRD: normally TTS at [facility] 10/30, 11/1, will do off schedule until can get back on schedule.  Hd today.  Now that intubated and on pressors, rec leaving in fem nontunneled HD cath for now in case we need to move to CRRT.  - VVS had been consulted, OK to use AVG away from areas of epidermolysis 4. Anemia of ESRD: hgb actually looks good -  anticipate it will go down with time  5. Metabolic Bone Disease: continue calcitriol , resume auryxia once eating  6.  Bleeding AVG -  as above  7. Hyperkalemia-  resolved 8.  Nonconvulsive status epilepticus: intubated, on propofol for burst suppression 9.  Dispo: needs to get pall care c/s    Subjective:    Had nonconvulsive status with increasing doses of AEDs yesterday.  Intubated and started propofol for burst suppression. Now on pressors.  HD today.     Objective:   BP (!) 105/47   Pulse 73   Temp 98.6 F (37 C)   Resp 15   Ht 4\' 8"  (1.422 m)   Wt 43 kg   SpO2 100%   BMI 21.25 kg/m   Intake/Output Summary (Last 24 hours) at 12/19/2021 1112 Last data filed at 12/19/2021 0800 Gross per 24 hour  Intake 2272.17 ml  Output 100 ml  Net 2172.17 ml   Weight change:   Physical Exam: Gen:NAD, sleeping and easily arousable CVS: RRR Resp: clear Abd: soft Ext: R sided weakness ACCESS: R fem catheter c/d/I, L AVG + T/B, no further bleeding.  + two blisters/ skin tears on AVG but accessed successfully  Imaging: Portable Chest x-ray  Result Date: 12/18/2021 CLINICAL DATA:  Endotracheal tube placement. EXAM: PORTABLE CHEST 1 VIEW COMPARISON:  December 16, 2021. FINDINGS: Stable cardiomediastinal silhouette. Endotracheal tube is in  grossly good position. Feeding tube is seen entering stomach. Lungs are clear. Bony thorax is unremarkable. IMPRESSION: Endotracheal tube in grossly good position. Feeding tube is seen entering stomach. Electronically Signed   By: Marijo Conception M.D.   On: 12/18/2021 16:30   Overnight EEG with video  Result Date: 12/18/2021 [name], MD     12/19/2021  9:31 AM Patient Name: Erica [name] MRN: [id number] Epilepsy Attending: [name] Referring Physician/Provider: [name], MD Duration: 12/17/2021 1402 to 12/18/2021 1830  Patient history: 66 yo F with right hemiplegia with leftward gaze and aphasia.EEG to evaluate for seizure  Level of alertness: lethargic  AEDs during EEG study: LEV, LCM, PHT, Phenobarb  Technical aspects: This EEG study was done with scalp electrodes positioned according to the 10-20 International system of electrode placement. Electrical activity was reviewed with band pass filter of 1-70Hz , sensitivity of 7 uV/mm, display speed of 59mm/sec with a 60Hz  notched filter applied as appropriate. EEG data were recorded continuously and digitally stored.  Video monitoring was available and reviewed as appropriate.  Description: EEG showed lateralized periodic discharges at 1 Hz in left posterior quadrant which increased in frequency and morphology and involved left hemisphere as well as vertex region. No clinical signs were seen during this EEG pattern except at around 2125 on 12/17/2021 patient was noted  to have right upper extremity jerking.  This EEG pattern was consistent with focal status epilepticus arising from left posterior quadrant which was mostly nonconvulsive except briefly at 2125 on 12/17/2021 when right upper extremity jerking was noted.  EEG also showed continuous generalized 3 to 4 Hz delta and delta slowing.  At around 1536, patient was intubated and started on propofol.  Subsequently, EEG showed continued to show focal nonconvulsive status epilepticus  arising from left posterior quadrant Patient was unhooked from EEG on 12/18/2021 between 0011 to 0424. ABNORMALITY -Focal nonconvulsive status epilepticus, left posterior quadrant - Continuous slow, generalized  IMPRESSION: This study showed evidence of electrographic/nonconvulsive status epilepticus arising from left posterior quadrant.  Briefly at 2125 12/17/2021 patient was also noted to have right upper extremity jerking consistent with focal motor seizure. There was also severe diffuse encephalopathy, nonspecific etiology.  Priyanka Barbra Sarks   DG Abd Portable 1V  Result Date: 12/17/2021 CLINICAL DATA:  Feeding tube placement EXAM: PORTABLE ABDOMEN - 1 VIEW COMPARISON:  09/22/2020 FINDINGS: Weighted tip feeding tube is been advanced to the level of the pylorus . Stomach is decompressed. Bowel gas pattern unremarkable. Right femoral catheter extends up to the L5 level. Right hip arthroplasty component partially visualized. IMPRESSION: Feeding tube advanced to the pylorus . Electronically Signed   By: Lucrezia Europe M.D.   On: 12/17/2021 11:54    Labs: BMET Recent Labs  Lab 12/15/21 2140 12/16/21 0212 12/16/21 0905 12/17/21 0409 12/17/21 1630 12/18/21 0358 12/18/21 1428 12/18/21 1645 12/19/21 0339  NA 126* 128* 132* 133*  --  136 136 132* 132*  K 7.2* 6.1* 3.6 2.8*  --  2.7* 3.8 3.4* 3.6  CL 88* 89* 91* 93*  --  93* 95*  --  98  CO2 20* 21* 24 22  --  28 28  --  24  GLUCOSE 71 107* 120* 178*  --  140* 148*  --  111*  BUN 55* 57* 17 22  --  11 15  --  20  CREATININE 4.16* 4.45* 2.11* 3.05*  --  1.91* 2.22*  --  2.50*  CALCIUM 10.0 10.1 9.3 8.8*  --  9.2 8.8*  --  8.4*  PHOS 4.3 4.0  --   --  3.9 2.6 3.0  --  3.3   CBC Recent Labs  Lab 12/15/21 1325 12/15/21 1349 12/16/21 0905 12/17/21 1740 12/18/21 0358 12/18/21 1645 12/19/21 0550  WBC 3.5*   < > 9.0 19.3* 15.9*  --  13.0*  NEUTROABS 2.0  --   --   --   --   --   --   HGB 12.3   < > 9.5* 8.5* 9.0* 9.2* 9.9*  HCT 39.0   < > 28.9*  27.2* 28.4* 27.0* 31.5*  MCV 100.0   < > 97.6 101.9* 100.0  --  100.0  PLT 112*   < > 130* 106* 111*  --  162   < > = values in this interval not displayed.    Medications:     carvedilol  12.5 mg Per Tube BID WC   Chlorhexidine Gluconate Cloth  6 each Topical Q0600   docusate  100 mg Per Tube BID   famotidine  10 mg Per Tube Daily   folic acid  1 mg Per Tube Q1200   heparin injection (subcutaneous)  5,000 Units Subcutaneous Q12H   hydrALAZINE  25 mg Per Tube Q8H   insulin aspart  0-6 Units Subcutaneous Q4H   multivitamin  1 tablet Oral QHS   nystatin  5 mL Oral QID   mouth rinse  15 mL Mouth Rinse Q2H   pantoprazole (PROTONIX) IV  40 mg Intravenous QHS   phenytoin (DILANTIN) IV  100 mg Intravenous Q8H   polyethylene glycol  17 g Per Tube Daily   sodium chloride flush  10-40 mL Intracatheter Q12H    [name], MD 12/19/2021, 11:12 AM

## 2021-12-19 NOTE — Progress Notes (Signed)
Subjective: We were finally able to achieve burst suppression after around 430 this morning after addition of Versed  ROS: unable to obtain due to poor mental status  Examination  Vital signs in last 24 hours: Temp:  [92.8 F (33.8 C)-101.7 F (38.7 C)] 101.7 F (38.7 C) (11/03 1300) Pulse Rate:  [57-83] 82 (11/03 1300) Resp:  [13-23] 15 (11/03 1300) BP: (76-165)/(37-77) 127/37 (11/03 1300) SpO2:  [97 %-100 %] 97 % (11/03 1300) FiO2 (%):  [30 %-100 %] 30 % (11/03 1200)  General: lying in bed, intubated Neuro: On propofol at 80 mcg/h, Versed at 10 ml/h, comatose, does not open eyes noxious stimuli, pupils small and difficult to appreciate any reactivity, corneal reflex absent, gag reflex present, does not withdraw to noxious stimuli in all 4 extremities  Basic Metabolic Panel: Recent Labs  Lab 12/16/21 0212 12/16/21 0905 12/17/21 0409 12/17/21 1630 12/18/21 0358 12/18/21 1428 12/18/21 1645 12/19/21 0339  NA 128* 132* 133*  --  136 136 132* 132*  K 6.1* 3.6 2.8*  --  2.7* 3.8 3.4* 3.6  CL 89* 91* 93*  --  93* 95*  --  98  CO2 21* 24 22  --  28 28  --  24  GLUCOSE 107* 120* 178*  --  140* 148*  --  111*  BUN 57* 17 22  --  11 15  --  20  CREATININE 4.45* 2.11* 3.05*  --  1.91* 2.22*  --  2.50*  CALCIUM 10.1 9.3 8.8*  --  9.2 8.8*  --  8.4*  MG  --   --   --  1.7 1.6* 2.1  --  2.2  PHOS 4.0  --   --  3.9 2.6 3.0  --  3.3    CBC: Recent Labs  Lab 12/15/21 1325 12/15/21 1349 12/16/21 0213 12/16/21 0905 12/17/21 1740 12/18/21 0358 12/18/21 1645 12/19/21 0550  WBC 3.5*  --  8.8 9.0 19.3* 15.9*  --  13.0*  NEUTROABS 2.0  --   --   --   --   --   --   --   HGB 12.3   < > 9.3* 9.5* 8.5* 9.0* 9.2* 9.9*  HCT 39.0   < > 29.2* 28.9* 27.2* 28.4* 27.0* 31.5*  MCV 100.0  --  100.3* 97.6 101.9* 100.0  --  100.0  PLT 112*  --  124* 130* 106* 111*  --  162   < > = values in this interval not displayed.     Coagulation Studies: No results for input(s): "LABPROT", "INR" in  the last 72 hours.  Imaging No new brain imaging overnight  ASSESSMENT AND PLAN: 66 year old female with history of epilepsy who presented with breakthrough seizures in the setting of hyponatremia (sodium 119).  MRI brain showed restricted diffusion/cortical reddening and left parieto-occipital region   Focal electrographic/nonconvulsive status epilepticus, refractory Epilepsy with breakthrough seizure -Etiology: Hyponatremia with sodium 119.  No evidence of infection (no leukocytosis, afebrile) - Its unclear what patient's AED dosing is. She was last seen by our team in 2020 and at that time was discharged on Dilantin 150 mg twice daily, Topamax 25 mg twice daily and Keppra 1000mg  daily with additional 500 mg postdialysis.  However during current admission it appears that patient was only on Keppra 500 mg daily   Recommendations -Continue Keppra 500 mg daily with additional dose of 250 mg after dialysis -Continue Vimpat 150 mg twice daily, phenytoin 100 mg 3 times daily -Continue LTM  EEG while patient is on sedation -Plan to wean off of propofol at 10 mcg/h tomorrow morning if no seizures overnight. -Can likely stop Versed on Sunday  -If seizures recur, can add maintenance phenobarb -Called and spoke with patient's son on 12/18/2021.  Described that patient has been in refractory status epilepticus and plan for intubation if seizures continue. -Discussed plan with Dr. Shearon Stalls. -Continue seizure precautions -As needed IV Ativan 2 mg for clinical seizure-like activity    CRITICAL CARE Performed by: [name]     Total critical care time: 36 minutes   Critical care time was exclusive of separately billable procedures and treating other patients.   Critical care was necessary to treat or prevent imminent or life-threatening deterioration.   Critical care was time spent personally by me on the following activities: development of treatment plan with patient and/or surrogate as well  as nursing, discussions with consultants, evaluation of patient's response to treatment, examination of patient, obtaining history from patient or surrogate, ordering and performing treatments and interventions, ordering and review of laboratory studies, ordering and review of radiographic studies, pulse oximetry and re-evaluation of patient's condition.    Zeb Comfort Epilepsy [facility] For questions after 5pm please refer to AMION to reach the Neurologist on call

## 2021-12-19 NOTE — Progress Notes (Addendum)
This rn arrived to 3M11 to perform HD procedure Pt unresponsive, eeg in progress, bear hugger in place, versed propofol and fentanyl drips infusing  Informed consent signed and in chart.   Treatment initiated: 0826 Treatment completed: 1200  Patient tolerated well.  Pt unresponsive without acute distress.  Hand-off given to patient's nurse.   Access used: graft left arm  Access issues: none  Total UF removed: 1.8 liters Medication(s) given: none Post HD VS: 94/40 MAP 54 HR 78 RR 16 Sat 98 on vent fio2 30% Temp core 98.8  Post HD weight:    [facility]

## 2021-12-20 DIAGNOSIS — R4182 Altered mental status, unspecified: Secondary | ICD-10-CM | POA: Diagnosis not present

## 2021-12-20 DIAGNOSIS — R569 Unspecified convulsions: Secondary | ICD-10-CM | POA: Diagnosis not present

## 2021-12-20 DIAGNOSIS — G40901 Epilepsy, unspecified, not intractable, with status epilepticus: Secondary | ICD-10-CM | POA: Diagnosis not present

## 2021-12-20 LAB — GLUCOSE, CAPILLARY
Glucose-Capillary: 227 mg/dL — ABNORMAL HIGH (ref 70–99)
Glucose-Capillary: 160 mg/dL — ABNORMAL HIGH (ref 70–99)
Glucose-Capillary: 253 mg/dL — ABNORMAL HIGH (ref 70–99)
Glucose-Capillary: 198 mg/dL — ABNORMAL HIGH (ref 70–99)
Glucose-Capillary: 187 mg/dL — ABNORMAL HIGH (ref 70–99)

## 2021-12-20 LAB — CBC
HCT: 33.5 % — ABNORMAL LOW (ref 36.0–46.0)
Hemoglobin: 10.5 g/dL — ABNORMAL LOW (ref 12.0–15.0)
MCH: 31.5 pg (ref 26.0–34.0)
MCHC: 31.3 g/dL (ref 30.0–36.0)
MCV: 100.6 fL — ABNORMAL HIGH (ref 80.0–100.0)
Platelets: 160 10*3/uL (ref 150–400)
RBC: 3.33 MIL/uL — ABNORMAL LOW (ref 3.87–5.11)
RDW: 13.8 % (ref 11.5–15.5)
WBC: 14 10*3/uL — ABNORMAL HIGH (ref 4.0–10.5)
nRBC: 0 % (ref 0.0–0.2)

## 2021-12-20 LAB — BASIC METABOLIC PANEL
Anion gap: 18 — ABNORMAL HIGH (ref 5–15)
BUN: 15 mg/dL (ref 8–23)
CO2: 25 mmol/L (ref 22–32)
Calcium: 8.7 mg/dL — ABNORMAL LOW (ref 8.9–10.3)
Chloride: 89 mmol/L — ABNORMAL LOW (ref 98–111)
Creatinine, Ser: 1.8 mg/dL — ABNORMAL HIGH (ref 0.44–1.00)
GFR, Estimated: 30 mL/min — ABNORMAL LOW (ref >60)
Glucose, Bld: 196 mg/dL — ABNORMAL HIGH (ref 70–99)
Potassium: 3.4 mmol/L — ABNORMAL LOW (ref 3.5–5.1)
Sodium: 132 mmol/L — ABNORMAL LOW (ref 135–145)

## 2021-12-20 LAB — MAGNESIUM: Magnesium: 1.7 mg/dL (ref 1.7–2.4)

## 2021-12-20 LAB — PHOSPHORUS: Phosphorus: 2.1 mg/dL — ABNORMAL LOW (ref 2.5–4.6)

## 2021-12-20 MED ORDER — DOCUSATE SODIUM 50 MG/5ML PO LIQD: 100.0000 mg | Freq: Two times a day (BID) | ORAL | Status: DC | PRN

## 2021-12-20 MED ORDER — ACETAMINOPHEN 325 MG PO TABS
650.0000 mg | ORAL_TABLET | ORAL | Status: DC | PRN
  Administered 2021-12-20 – 2021-12-31 (×9): 650 mg
  Filled 2021-12-20 (×9): qty 2

## 2021-12-20 MED ORDER — CHLORHEXIDINE GLUCONATE CLOTH 2 % EX PADS
6.0000 | MEDICATED_PAD | CUTANEOUS | Status: DC
  Administered 2021-12-21 – 2021-12-28 (×9): 6 via TOPICAL

## 2021-12-20 MED ORDER — RENA-VITE PO TABS
1.0000 | ORAL_TABLET | Freq: Every day | ORAL | Status: DC
  Administered 2021-12-20 – 2021-12-30 (×11): 1
  Filled 2021-12-20 (×12): qty 1

## 2021-12-20 NOTE — Progress Notes (Signed)
[facility] Progress Note   Assessment/ Plan:   Assessment/Plan: 66 year old BF with multiple medical issues including ESRD-  now admitted with code stroke s/p TNK 1 new CVA- with lateral gaze and right sided weakness-  s/p TNK per neurology.  EEG with nonconvulsive status, per neuro 2 ESRD: normally TTS at [facility] 10/30, 11/1, will do off schedule until can get back on schedule.  Hd today.  Now that intubated and on pressors, rec leaving in fem nontunneled HD cath for now in case we need to move to CRRT.  Last HD 11/3 - VVS had been consulted, OK to use AVG away from areas of epidermolysis 4. Anemia of ESRD: hgb actually looks good -  anticipate it will go down with time  5. Metabolic Bone Disease: continue calcitriol , resume auryxia once eating  6.  Bleeding AVG -  as above  7. Hyperkalemia-  resolved 8.  Nonconvulsive status epilepticus: intubated, on propofol for burst suppression 9.  Dispo: needs to get pall care c/s    Subjective:     On Levophed at 37, coming down on propofol so hopefuly coming down on pressors.  Had IHD yesterday, tolerated.      Objective:   BP (!) 139/42   Pulse 71   Temp 97.7 F (36.5 C) (Esophageal)   Resp 16   Ht 4\' 8"  (1.422 m)   Wt 44.5 kg   SpO2 99%   BMI 21.99 kg/m   Intake/Output Summary (Last 24 hours) at 12/20/2021 0859 Last data filed at 12/20/2021 0800 Gross per 24 hour  Intake 3700.91 ml  Output 1800 ml  Net 1900.91 ml   Weight change:   Physical Exam: Gen:NAD, EEG leads in place and intubated CVS: RRR Resp: clear Abd: soft Ext: R sided weakness Neuro: intubated, sedated, EEG leads in place ACCESS: R fem catheter c/d/I, L AVG + T/B, no further bleeding.  + two blisters/ skin tears on AVG but accessed successfully  Imaging: Portable Chest x-ray  Result Date: 12/18/2021 CLINICAL DATA:  Endotracheal tube placement. EXAM: PORTABLE CHEST 1 VIEW COMPARISON:  December 16, 2021. FINDINGS: Stable  cardiomediastinal silhouette. Endotracheal tube is in grossly good position. Feeding tube is seen entering stomach. Lungs are clear. Bony thorax is unremarkable. IMPRESSION: Endotracheal tube in grossly good position. Feeding tube is seen entering stomach. Electronically Signed   By: Marijo Conception M.D.   On: 12/18/2021 16:30    Labs: BMET Recent Labs  Lab 12/16/21 0212 12/16/21 0905 12/17/21 0409 12/17/21 1630 12/18/21 0358 12/18/21 1428 12/18/21 1645 12/19/21 0339 12/19/21 1835 12/20/21 0508  NA 128* 132* 133*  --  136 136 132* 132*  --  132*  K 6.1* 3.6 2.8*  --  2.7* 3.8 3.4* 3.6  --  3.4*  CL 89* 91* 93*  --  93* 95*  --  98  --  89*  CO2 21* 24 22  --  28 28  --  24  --  25  GLUCOSE 107* 120* 178*  --  140* 148*  --  111*  --  196*  BUN 57* 17 22  --  11 15  --  20  --  15  CREATININE 4.45* 2.11* 3.05*  --  1.91* 2.22*  --  2.50*  --  1.80*  CALCIUM 10.1 9.3 8.8*  --  9.2 8.8*  --  8.4*  --  8.7*  PHOS 4.0  --   --  3.9 2.6 3.0  --  3.3 1.8* 2.1*   CBC Recent Labs  Lab 12/15/21 1325 12/15/21 1349 12/17/21 1740 12/18/21 0358 12/18/21 1645 12/19/21 0550 12/20/21 0508  WBC 3.5*   < > 19.3* 15.9*  --  13.0* 14.0*  NEUTROABS 2.0  --   --   --   --   --   --   HGB 12.3   < > 8.5* 9.0* 9.2* 9.9* 10.5*  HCT 39.0   < > 27.2* 28.4* 27.0* 31.5* 33.5*  MCV 100.0   < > 101.9* 100.0  --  100.0 100.6*  PLT 112*   < > 106* 111*  --  162 160   < > = values in this interval not displayed.    Medications:     Chlorhexidine Gluconate Cloth  6 each Topical Q0600   docusate  100 mg Per Tube BID   famotidine  10 mg Per Tube Daily   folic acid  1 mg Per Tube Q1200   heparin injection (subcutaneous)  5,000 Units Subcutaneous Q12H   insulin aspart  0-6 Units Subcutaneous Q4H   multivitamin  1 tablet Oral QHS   nystatin  5 mL Oral QID   mouth rinse  15 mL Mouth Rinse Q2H   pantoprazole (PROTONIX) IV  40 mg Intravenous QHS   phenytoin (DILANTIN) IV  100 mg Intravenous Q8H    polyethylene glycol  17 g Per Tube Daily   sodium chloride flush  10-40 mL Intracatheter Q12H    [name], MD 12/20/2021, 8:59 AM

## 2021-12-20 NOTE — Procedures (Addendum)
Patient Name: Erica [name]  MRN: [id number]  Epilepsy Attending: [name]  Referring Physician/Provider: [name], MD  Duration: 12/19/2021 1830 to 12/20/2021 1830   Patient history: 66 yo F with right hemiplegia with leftward gaze and aphasia.EEG to evaluate for seizure   Level of alertness: comatose    AEDs during EEG study: LEV, LCM, PHT, Phenobarb, Propofol, Versed   Technical aspects: This EEG study was done with scalp electrodes positioned according to the 10-20 International system of electrode placement. Electrical activity was reviewed with band pass filter of 1-70Hz , sensitivity of 7 uV/mm, display speed of 55mm/sec with a 60Hz  notched filter applied as appropriate. EEG data were recorded continuously and digitally stored.  Video monitoring was available and reviewed as appropriate.   Description: EEG showed burst suppression pattern with bursts of sharply contoured 3 to 7 Hz theta-delta slowing admixed with 15 to 18 Hz sharply contoured activity predominantly in right hemisphere lasting 4 to 6 seconds alternating with EEG suppression lasting about 30 seconds. As sedation was weaned, eeg showed near continuous generalized 3-6hz  theta-delta slowing admixed with intermittent 8-9Hz  alpha activity.   ABNORMALITY -Burst suppression, generalized -Continuous slow, generalized   IMPRESSION: This study was initially suggestive of profound diffuse encephalopathy, most likely related to sedation. As sedation was weaned, EEG was suggestive of severe diffuse encephalopathy. No seizures were seen during this study.   Angelissa Supan Barbra Sarks

## 2021-12-20 NOTE — Progress Notes (Addendum)
Subjective: On propofol at a rate of 70 and Versed at a rate of 10.   Objective: Current vital signs: BP (!) 147/43   Pulse 72   Temp (!) 97.3 F (36.3 C)   Resp 16   Ht 4\' 8"  (1.422 m)   Wt 44.5 kg   SpO2 100%   BMI 21.99 kg/m  Vital signs in last 24 hours: Temp:  [93.6 F (34.2 C)-101.7 F (38.7 C)] 97.3 F (36.3 C) (11/04 0732) Pulse Rate:  [68-84] 72 (11/04 0645) Resp:  [15-24] 16 (11/04 0645) BP: (94-166)/(11-56) 147/43 (11/04 0645) SpO2:  [89 %-100 %] 100 % (11/04 0645) FiO2 (%):  [30 %] 30 % (11/04 0459) Weight:  [44.5 kg] 44.5 kg (11/04 0434)  Intake/Output from previous day: 11/03 0701 - 11/04 0700 In: 8101 [I.V.:2129.5; BP/ZW:2585; IV Piggyback:213.5] Out: 1800  Intake/Output this shift: No intake/output data recorded. Nutritional status:  Diet Order             Diet NPO time specified  Diet effective now                   General: Lying in bed, intubated HEENT: Neck is supple  Neuro: On propofol at 70 mcg/kg/min. Comatose/sedated, does not open eyes or otherwise respond to noxious stimuli, pupils small and unreactive, no doll's eye reflex, corneal reflexes absent. Does not withdraw or otherwise react to noxious stimuli in any extremity. Flaccid tone in all 4 extremities   Lab Results: Results for orders placed or performed during the hospital encounter of 12/15/21 (from the past 48 hour(s))  Glucose, capillary     Status: Abnormal   Collection Time: 12/18/21 11:13 AM  Result Value Ref Range   Glucose-Capillary 171 (H) 70 - 99 mg/dL    Comment: Glucose reference range applies only to samples taken after fasting for at least 8 hours.  Magnesium     Status: None   Collection Time: 12/18/21  2:28 PM  Result Value Ref Range   Magnesium 2.1 1.7 - 2.4 mg/dL    Comment: Performed at [facility], [place] [address], [place], [place] [address]  Phosphorus     Status: None   Collection Time: 12/18/21  2:28 PM  Result Value Ref Range   Phosphorus  3.0 2.5 - 4.6 mg/dL    Comment: Performed at [facility] [address], [place], [place] [address]  Basic metabolic panel     Status: Abnormal   Collection Time: 12/18/21  2:28 PM  Result Value Ref Range   Sodium 136 135 - 145 mmol/L   Potassium 3.8 3.5 - 5.1 mmol/L   Chloride 95 (L) 98 - 111 mmol/L   CO2 28 22 - 32 mmol/L   Glucose, Bld 148 (H) 70 - 99 mg/dL    Comment: Glucose reference range applies only to samples taken after fasting for at least 8 hours.   BUN 15 8 - 23 mg/dL   Creatinine, Ser 2.22 (H) 0.44 - 1.00 mg/dL   Calcium 8.8 (L) 8.9 - 10.3 mg/dL   GFR, Estimated 24 (L) >60 mL/min    Comment: (NOTE) Calculated using the CKD-EPI Creatinine Equation (2021)    Anion gap 13 5 - 15    Comment: Performed at [facility] [address] [place], [place] [address]  Glucose, capillary     Status: Abnormal   Collection Time: 12/18/21  4:09 PM  Result Value Ref Range   Glucose-Capillary 124 (H)  70 - 99 mg/dL    Comment: Glucose reference range applies only to samples taken after fasting for at least 8 hours.  I-STAT 7, (LYTES, BLD GAS, ICA, H+H)     Status: Abnormal   Collection Time: 12/18/21  4:45 PM  Result Value Ref Range   pH, Arterial 7.457 (H) 7.35 - 7.45   pCO2 arterial 44.1 32 - 48 mmHg   pO2, Arterial 544 (H) 83 - 108 mmHg   Bicarbonate 31.2 (H) 20.0 - 28.0 mmol/L   TCO2 33 (H) 22 - 32 mmol/L   O2 Saturation 100 %   Acid-Base Excess 7.0 (H) 0.0 - 2.0 mmol/L   Sodium 132 (L) 135 - 145 mmol/L   Potassium 3.4 (L) 3.5 - 5.1 mmol/L   Calcium, Ion 1.15 1.15 - 1.40 mmol/L   HCT 27.0 (L) 36.0 - 46.0 %   Hemoglobin 9.2 (L) 12.0 - 15.0 g/dL   Patient temperature 97.8 F    Collection site RADIAL, ALLEN'S TEST ACCEPTABLE    Drawn by RT    Sample type ARTERIAL   Glucose, capillary     Status: Abnormal   Collection Time: 12/18/21  7:36 PM  Result Value Ref Range   Glucose-Capillary 161 (H) 70 - 99 mg/dL    Comment: Glucose reference range applies only to  samples taken after fasting for at least 8 hours.  Glucose, capillary     Status: Abnormal   Collection Time: 12/19/21 12:02 AM  Result Value Ref Range   Glucose-Capillary 137 (H) 70 - 99 mg/dL    Comment: Glucose reference range applies only to samples taken after fasting for at least 8 hours.  Glucose, capillary     Status: Abnormal   Collection Time: 12/19/21  3:20 AM  Result Value Ref Range   Glucose-Capillary 106 (H) 70 - 99 mg/dL    Comment: Glucose reference range applies only to samples taken after fasting for at least 8 hours.  Magnesium     Status: None   Collection Time: 12/19/21  3:39 AM  Result Value Ref Range   Magnesium 2.2 1.7 - 2.4 mg/dL    Comment: Performed at [facility] [address], [place], [place] [address]  Phosphorus     Status: None   Collection Time: 12/19/21  3:39 AM  Result Value Ref Range   Phosphorus 3.3 2.5 - 4.6 mg/dL    Comment: Performed at [facility] [address], [place], [place] [address]  Basic metabolic panel     Status: Abnormal   Collection Time: 12/19/21  3:39 AM  Result Value Ref Range   Sodium 132 (L) 135 - 145 mmol/L   Potassium 3.6 3.5 - 5.1 mmol/L   Chloride 98 98 - 111 mmol/L   CO2 24 22 - 32 mmol/L   Glucose, Bld 111 (H) 70 - 99 mg/dL    Comment: Glucose reference range applies only to samples taken after fasting for at least 8 hours.   BUN 20 8 - 23 mg/dL   Creatinine, Ser 2.50 (H) 0.44 - 1.00 mg/dL   Calcium 8.4 (L) 8.9 - 10.3 mg/dL   GFR, Estimated 21 (L) >60 mL/min    Comment: (NOTE) Calculated using the CKD-EPI Creatinine Equation (2021)    Anion gap 10 5 - 15    Comment: Performed at [facility] [address], [place], [place] [address]  Triglycerides     Status: None   Collection Time: 12/19/21  3:39  AM  Result Value Ref Range   Triglycerides 114 <150 mg/dL    Comment: Performed at [facility] [address], [place], [place] [address]  Troponin I (High Sensitivity)     Status:  Abnormal   Collection Time: 12/19/21  3:39 AM  Result Value Ref Range   Troponin I (High Sensitivity) 55 (H) <18 ng/L    Comment: (NOTE) Elevated high sensitivity troponin I (hsTnI) values and significant  changes across serial measurements may suggest ACS but many other  chronic and acute conditions are known to elevate hsTnI results.  Refer to the "Links" section for chest pain algorithms and additional  guidance. Performed at [facility], [place] [address], [place], [place] [address]   CBC     Status: Abnormal   Collection Time: 12/19/21  5:50 AM  Result Value Ref Range   WBC 13.0 (H) 4.0 - 10.5 K/uL   RBC 3.15 (L) 3.87 - 5.11 MIL/uL   Hemoglobin 9.9 (L) 12.0 - 15.0 g/dL   HCT 31.5 (L) 36.0 - 46.0 %   MCV 100.0 80.0 - 100.0 fL   MCH 31.4 26.0 - 34.0 pg   MCHC 31.4 30.0 - 36.0 g/dL   RDW 13.6 11.5 - 15.5 %   Platelets 162 150 - 400 K/uL    Comment: REPEATED TO VERIFY   nRBC 0.2 0.0 - 0.2 %    Comment: Performed at [facility], [place] [address] [place], [place] [address]  Troponin I (High Sensitivity)     Status: Abnormal   Collection Time: 12/19/21  5:50 AM  Result Value Ref Range   Troponin I (High Sensitivity) 30 (H) <18 ng/L    Comment: DELTA CHECK NOTED (NOTE) Elevated high sensitivity troponin I (hsTnI) values and significant  changes across serial measurements may suggest ACS but many other  chronic and acute conditions are known to elevate hsTnI results.  Refer to the "Links" section for chest pain algorithms and additional  guidance. Performed at [facility], [place] [address] [place], [place] [address]   Glucose, capillary     Status: None   Collection Time: 12/19/21  8:02 AM  Result Value Ref Range   Glucose-Capillary 99 70 - 99 mg/dL    Comment: Glucose reference range applies only to samples taken after fasting for at least 8 hours.  Glucose, capillary     Status: Abnormal   Collection Time: 12/19/21 11:49 AM  Result Value Ref Range    Glucose-Capillary 134 (H) 70 - 99 mg/dL    Comment: Glucose reference range applies only to samples taken after fasting for at least 8 hours.  Glucose, capillary     Status: Abnormal   Collection Time: 12/19/21  3:51 PM  Result Value Ref Range   Glucose-Capillary 239 (H) 70 - 99 mg/dL    Comment: Glucose reference range applies only to samples taken after fasting for at least 8 hours.  Magnesium     Status: None   Collection Time: 12/19/21  6:35 PM  Result Value Ref Range   Magnesium 1.7 1.7 - 2.4 mg/dL    Comment: Performed at [facility], [place] [address] [place], [place] [address]  Phosphorus     Status: Abnormal   Collection Time: 12/19/21  6:35 PM  Result Value Ref Range   Phosphorus 1.8 (L) 2.5 - 4.6 mg/dL    Comment: Performed at [facility] [address], [place], [place] [address]  Glucose, capillary     Status: Abnormal   Collection Time: 12/19/21  7:51 PM  Result Value Ref Range   Glucose-Capillary 238 (H) 70 - 99 mg/dL    Comment: Glucose reference range applies only to samples taken after fasting for at least 8 hours.  Glucose, capillary     Status: Abnormal   Collection Time: 12/19/21 11:29 PM  Result Value Ref Range   Glucose-Capillary 268 (H) 70 - 99 mg/dL    Comment: Glucose reference range applies only to samples taken after fasting for at least 8 hours.  Glucose, capillary     Status: Abnormal   Collection Time: 12/20/21  3:18 AM  Result Value Ref Range   Glucose-Capillary 227 (H) 70 - 99 mg/dL    Comment: Glucose reference range applies only to samples taken after fasting for at least 8 hours.  Magnesium     Status: None   Collection Time: 12/20/21  5:08 AM  Result Value Ref Range   Magnesium 1.7 1.7 - 2.4 mg/dL    Comment: Performed at [facility], [place] [address] [place], [place] [address]  Phosphorus     Status: Abnormal   Collection Time: 12/20/21  5:08 AM  Result Value Ref Range   Phosphorus 2.1 (L) 2.5 - 4.6 mg/dL    Comment:  Performed at [facility] [address] [place], [place] [address]  Basic metabolic panel     Status: Abnormal   Collection Time: 12/20/21  5:08 AM  Result Value Ref Range   Sodium 132 (L) 135 - 145 mmol/L   Potassium 3.4 (L) 3.5 - 5.1 mmol/L   Chloride 89 (L) 98 - 111 mmol/L   CO2 25 22 - 32 mmol/L   Glucose, Bld 196 (H) 70 - 99 mg/dL    Comment: Glucose reference range applies only to samples taken after fasting for at least 8 hours.   BUN 15 8 - 23 mg/dL   Creatinine, Ser 1.80 (H) 0.44 - 1.00 mg/dL   Calcium 8.7 (L) 8.9 - 10.3 mg/dL   GFR, Estimated 30 (L) >60 mL/min    Comment: (NOTE) Calculated using the CKD-EPI Creatinine Equation (2021)    Anion gap 18 (H) 5 - 15    Comment: Performed at [facility] [address], [place], [place] [address]  CBC     Status: Abnormal   Collection Time: 12/20/21  5:08 AM  Result Value Ref Range   WBC 14.0 (H) 4.0 - 10.5 K/uL   RBC 3.33 (L) 3.87 - 5.11 MIL/uL   Hemoglobin 10.5 (L) 12.0 - 15.0 g/dL   HCT 33.5 (L) 36.0 - 46.0 %   MCV 100.6 (H) 80.0 - 100.0 fL   MCH 31.5 26.0 - 34.0 pg   MCHC 31.3 30.0 - 36.0 g/dL   RDW 13.8 11.5 - 15.5 %   Platelets 160 150 - 400 K/uL   nRBC 0.0 0.0 - 0.2 %    Comment: Performed at [facility], [place] [address], [place],  [address]  Glucose, capillary     Status: Abnormal   Collection Time: 12/20/21  7:31 AM  Result Value Ref Range   Glucose-Capillary 160 (H) 70 - 99 mg/dL    Comment: Glucose reference range applies only to samples taken after fasting for at least 8 hours.    Recent Results (from the past 240 hour(s))  MRSA Next Gen by PCR, Nasal     Status: None  Collection Time: 12/15/21  9:35 PM   Specimen: Nasal Mucosa; Nasal Swab  Result Value Ref Range Status   MRSA by PCR Next Gen NOT DETECTED NOT DETECTED Final    Comment: (NOTE) The GeneXpert MRSA Assay (FDA approved for NASAL specimens only), is one component of a comprehensive MRSA colonization  surveillance program. It is not intended to diagnose MRSA infection nor to guide or monitor treatment for MRSA infections. Test performance is not FDA approved in patients less than 24 years old. Performed at [facility], [place] [address] [place], [place] [address]   Culture, blood (Routine X 2) w Reflex to ID Panel     Status: None (Preliminary result)   Collection Time: 12/16/21  6:51 PM   Specimen: BLOOD RIGHT HAND  Result Value Ref Range Status   Specimen Description BLOOD RIGHT HAND  Final   Special Requests   Final    BOTTLES DRAWN AEROBIC ONLY Blood Culture results may not be optimal due to an inadequate volume of blood received in culture bottles   Culture   Final    NO GROWTH 4 DAYS Performed at [facility], [place] [address] [place], [place] [address]    Report Status PENDING  Incomplete  Culture, blood (Routine X 2) w Reflex to ID Panel     Status: None (Preliminary result)   Collection Time: 12/16/21  6:58 PM   Specimen: BLOOD RIGHT ARM  Result Value Ref Range Status   Specimen Description BLOOD RIGHT ARM  Final   Special Requests   Final    BOTTLES DRAWN AEROBIC ONLY Blood Culture results may not be optimal due to an inadequate volume of blood received in culture bottles   Culture   Final    NO GROWTH 4 DAYS Performed at [facility], [place] [address], [place], [place] [address]    Report Status PENDING  Incomplete    Lipid Panel Recent Labs    12/19/21 0339  TRIG 114    Studies/Results: Portable Chest x-ray  Result Date: 12/18/2021 CLINICAL DATA:  Endotracheal tube placement. EXAM: PORTABLE CHEST 1 VIEW COMPARISON:  December 16, 2021. FINDINGS: Stable cardiomediastinal silhouette. Endotracheal tube is in grossly good position. Feeding tube is seen entering stomach. Lungs are clear. Bony thorax is unremarkable. IMPRESSION: Endotracheal tube in grossly good position. Feeding tube is seen entering stomach. Electronically Signed   By: Marijo Conception  M.D.   On: 12/18/2021 16:30   Overnight EEG with video  Result Date: 12/18/2021 [name], MD     12/19/2021  9:31 AM Patient Name: Erica [name] MRN: [id number] Epilepsy Attending: [name] Referring Physician/Provider: [name], MD Duration: 12/17/2021 1402 to 12/18/2021 1830  Patient history: 66 yo F with right hemiplegia with leftward gaze and aphasia.EEG to evaluate for seizure  Level of alertness: lethargic  AEDs during EEG study: LEV, LCM, PHT, Phenobarb  Technical aspects: This EEG study was done with scalp electrodes positioned according to the 10-20 International system of electrode placement. Electrical activity was reviewed with band pass filter of 1-70Hz , sensitivity of 7 uV/mm, display speed of 41mm/sec with a 60Hz  notched filter applied as appropriate. EEG data were recorded continuously and digitally stored.  Video monitoring was available and reviewed as appropriate.  Description: EEG showed lateralized periodic discharges at 1 Hz in left posterior quadrant which increased in frequency and morphology and involved left hemisphere as well as vertex region. No clinical signs were seen  during this EEG pattern except at around 2125 on 12/17/2021 patient was noted to have right upper extremity jerking.  This EEG pattern was consistent with focal status epilepticus arising from left posterior quadrant which was mostly nonconvulsive except briefly at 2125 on 12/17/2021 when right upper extremity jerking was noted.  EEG also showed continuous generalized 3 to 4 Hz delta and delta slowing.  At around 1536, patient was intubated and started on propofol.  Subsequently, EEG showed continued to show focal nonconvulsive status epilepticus arising from left posterior quadrant Patient was unhooked from EEG on 12/18/2021 between 0011 to 0424. ABNORMALITY -Focal nonconvulsive status epilepticus, left posterior quadrant - Continuous slow, generalized  IMPRESSION: This study showed evidence of  electrographic/nonconvulsive status epilepticus arising from left posterior quadrant.  Briefly at 2125 12/17/2021 patient was also noted to have right upper extremity jerking consistent with focal motor seizure. There was also severe diffuse encephalopathy, nonspecific etiology.  Priyanka Barbra Sarks    Medications: Scheduled:  Chlorhexidine Gluconate Cloth  6 each Topical Q0600   docusate  100 mg Per Tube BID   famotidine  10 mg Per Tube Daily   folic acid  1 mg Per Tube Q1200   heparin injection (subcutaneous)  5,000 Units Subcutaneous Q12H   insulin aspart  0-6 Units Subcutaneous Q4H   multivitamin  1 tablet Oral QHS   nystatin  5 mL Oral QID   mouth rinse  15 mL Mouth Rinse Q2H   pantoprazole (PROTONIX) IV  40 mg Intravenous QHS   phenytoin (DILANTIN) IV  100 mg Intravenous Q8H   polyethylene glycol  17 g Per Tube Daily   sodium chloride flush  10-40 mL Intracatheter Q12H   Continuous:  sodium chloride 10 mL/hr at 12/20/21 0700   feeding supplement (VITAL AF 1.2 CAL) 1,000 mL (12/20/21 0732)   fentaNYL infusion INTRAVENOUS 25 mcg/hr (12/20/21 0700)   lacosamide (VIMPAT) IV Stopped (12/19/21 2256)   lacosamide (VIMPAT) IV Stopped (12/19/21 1822)   levETIRAcetam Stopped (12/19/21 1733)   levETIRAcetam 500 mg (12/19/21 1325)   midazolam 10 mg/hr (12/20/21 0700)   norepinephrine (LEVOPHED) Adult infusion 17 mcg/min (12/20/21 0722)   propofol (DIPRIVAN) infusion 80 mcg/kg/min (12/20/21 0700)    ASSESSMENT: 66 year old female with history of epilepsy who presented with breakthrough seizures in the setting of hyponatremia (sodium 119).  MRI brain showed restricted diffusion/cortical ribboning in the left parieto-occipital region - LTM EEG report for today AM: Burst suppression, generalized. This study was suggestive of profound diffuse encephalopathy, most likely related to sedation. No seizures were seen during this study.  - Its unclear what patient's AED dosing is at home. She was last  seen by our team in 2020 and at that time was discharged on Dilantin 150 mg twice daily, Topamax 25 mg twice daily and Keppra 1000mg  daily with additional 500 mg postdialysis.  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  No evidence of infection (no leukocytosis, afebrile)   Recommendations: -Continue Keppra 500 mg daily with additional dose of 250 mg after dialysis -Continue Vimpat 150 mg twice daily, phenytoin 100 mg 3 times daily -Continue Dilantin at 100 mg IV TID -Dilantin free and total levels ordered for today -Continue LTM EEG while patient is on sedation -Plan to wean off of  propofol today (Saturday) by decreasing the rate in increments of 10 mcg/kg/hr each hour as there were no seizures overnight. Nursing is aware. Should be fully weaned off propofol by about 3 PM -On Versed at a rate of 10   -If seizures recur, can add maintenance phenobarbital -Continue seizure precautions -As needed IV Ativan 2 mg for clinical seizure-like activity  40 minutes spent in the neurological evaluation and management of this critically ill patient.    LOS: 5 days   @Electronically  signed: Dr. Kerney Elbe 12/20/2021  7:52 AM

## 2021-12-20 NOTE — Progress Notes (Signed)
NAME:  Erica [name], MRN:  [id number], DOB:  1955/07/18, LOS: 5 ADMISSION DATE:  12/15/2021, CONSULTATION DATE:  12/19/2021 REFERRING MD:   Ova Freshwater, ED PA, CHIEF COMPLAINT:  Altered Mental Status    History of Present Illness:  66 year old female with hx DM, CHF, HTN, seizures, ESRD on HD brought in from nursing home with AMS, R sided weakness and leftward gaze 10/30.  She was last known well around 11 AM when she was brought back to SNF from colonoscopy (which was not preformed as she was found to have snuff in her mouth).  On arrival to ED, she had left-sided gaze, right side was flaccid.  Head CT was negative and no LVO. TNKase was administered at 1349 Labs showed hyponatremia and K of 7.2, PCCM consulted for admission, transfer to [facility] planned. Per nursing, at her baseline she is not able to walk due to deconditioning but is able to move all 4 extremities.   Pertinent  Medical History  ESRD on HD, left arm AV fistula Hypertension Seizure disorder, on Keppra Diabetes type 2, on insulin  Significant Hospital Events: Including procedures, antibiotic start and stop dates in addition to other pertinent events   CTA head/neck 10/30>>> 1. No emergent large vessel occlusion or proximal hemodynamically significant stenosis. 2. Normal CT perfusion. R fem HD cath 10/31>>> EEG 10/31>>> This study is suggestive of moderate to severe diffuse encephalopathy, nonspecific etiology. No seizures or epileptiform discharges were seen throughout the recording. MRI brain 10/31 >, no evidence for acute stroke, left cortical ribbon, question consistent with seizure EEG 10/31 > moderate diffuse encephalopathy, nonspecific.  No seizure activity, no epileptiform discharges 11/4 - No seizures overnight. Plan to wean off propofol today and continue home AEDs.   Interim History / Subjective:  Ongoing cEEG. No seizures overnight   Objective   Blood pressure (!) 147/43, pulse 72, temperature (!) 97.2 F (36.2  C), resp. rate 16, height 4\' 8"  (1.422 m), weight 44.5 kg, SpO2 100 %.    Vent Mode: PRVC FiO2 (%):  [30 %] 30 % Set Rate:  [15 bmp] 15 bmp Vt Set:  [340 mL] 340 mL PEEP:  [5 cmH20] 5 cmH20 Plateau Pressure:  [15 cmH20-22 cmH20] 16 cmH20   Intake/Output Summary (Last 24 hours) at 12/20/2021 0729 Last data filed at 12/20/2021 0600 Gross per 24 hour  Intake 3674.21 ml  Output 1800 ml  Net 1874.21 ml   Filed Weights   12/17/21 2208 12/18/21 0214 12/20/21 0434  Weight: 42.8 kg 43 kg 44.5 kg    Examination: General: Critically ill appearing female lying in bed on full mechanical vent support  HENT: AT\, pupils 51mm and sluggish, pink and moist mucous membranes.  Lungs: Clear and equal bilateral breath sounds in all lobes  Cardiovascular: Regular rate and rhythm without rubs, murmurs, or gallops  Abdomen: soft, non-distended, and normoactive bowel sounds  Extremities: Warm and dry, 2+ pulses in all four extremities. Left upper arm dialysis fistula with + thrill. Right femoral trialysis cath.  Neuro: Chemically sedated. No movement noted with stimulation or painful stimuli  GU: Voiding   Resolved Hospital Problem list   Anemia   Assessment & Plan:  Focal electrographic\ non-convulsive status epilepticus and acute metabolic encephalopathy with h\o epilepsy.  Plan  - Neurology following  - SBP <180  - Continue AEDs => Keppra 500mg  daily and additional 250mg  after dialysis, Vimpat 150mg  twice daily, Phenytoin 100mg  three times daily.  - Ativan PRN clinical seizure  - Wean  off propofol today  Acute hypoxic respiratory failure Plan - Continue full ventilatory support  - VAP bundle  - Unable to SBT d\t burst suppression  - Wean FiO2 as able for SpO2 >92%  ESRD on HD Plan - Nephrology following - Continue HD; normal schedule Tues, Thur, Sat  - HD off schedule for now until can get back on schedule  - Keep femoral non-tunneled HD cath for now in case need for CRRT    Hypotension in the setting of increased sedation needs (history of HTN) Plan - Titrate levophed for MAP >65  - Hold Coreg and Hydralazine   Fluid and electrolyte imbalance Plan  - BMP in AM - Replace as indicated   Type II Diabetes   Plan - SSI  - Monitor for hypoglycemia   Severe malnutrition in the setting of chronic illness Plan  - Continue TF  - Palliative consult for overall goals of care    Best Practice (right click and "Reselect all SmartList Selections" daily)   Diet/type: tubefeeds DVT prophylaxis: SCD GI prophylaxis: H2B Lines: Dialysis Catheter and yes and it is still needed Foley:  N/A Code Status:  full code Review of Systems:   Unable to obtain d\t critical ill status   Critical care time: see attending note    Audie Box NP Student

## 2021-12-20 NOTE — Progress Notes (Signed)
LTM maint complete - no skin breakdown under:  Fp2 A2 .serviced Fp2 A2 Cz Pz  [facility] monitored, Event button test confirmed by [facility].

## 2021-12-20 NOTE — Progress Notes (Signed)
MB maintenance FZ, A2, O1, P7 electrodes . Impedances are now below 10k ohms

## 2021-12-21 DIAGNOSIS — G40901 Epilepsy, unspecified, not intractable, with status epilepticus: Secondary | ICD-10-CM | POA: Diagnosis not present

## 2021-12-21 DIAGNOSIS — R4182 Altered mental status, unspecified: Secondary | ICD-10-CM | POA: Diagnosis not present

## 2021-12-21 LAB — BASIC METABOLIC PANEL
Anion gap: 14 (ref 5–15)
Anion gap: 12 (ref 5–15)
BUN: 26 mg/dL — ABNORMAL HIGH (ref 8–23)
BUN: 27 mg/dL — ABNORMAL HIGH (ref 8–23)
CO2: 24 mmol/L (ref 22–32)
CO2: 25 mmol/L (ref 22–32)
Calcium: 8.4 mg/dL — ABNORMAL LOW (ref 8.9–10.3)
Calcium: 8.6 mg/dL — ABNORMAL LOW (ref 8.9–10.3)
Chloride: 85 mmol/L — ABNORMAL LOW (ref 98–111)
Chloride: 87 mmol/L — ABNORMAL LOW (ref 98–111)
Creatinine, Ser: 2.39 mg/dL — ABNORMAL HIGH (ref 0.44–1.00)
Creatinine, Ser: 2.46 mg/dL — ABNORMAL HIGH (ref 0.44–1.00)
GFR, Estimated: 22 mL/min — ABNORMAL LOW (ref >60)
GFR, Estimated: 21 mL/min — ABNORMAL LOW (ref >60)
Glucose, Bld: 188 mg/dL — ABNORMAL HIGH (ref 70–99)
Glucose, Bld: 226 mg/dL — ABNORMAL HIGH (ref 70–99)
Potassium: 4.2 mmol/L (ref 3.5–5.1)
Potassium: 4.3 mmol/L (ref 3.5–5.1)
Sodium: 123 mmol/L — ABNORMAL LOW (ref 135–145)
Sodium: 124 mmol/L — ABNORMAL LOW (ref 135–145)

## 2021-12-21 LAB — GLUCOSE, CAPILLARY
Glucose-Capillary: 137 mg/dL — ABNORMAL HIGH (ref 70–99)
Glucose-Capillary: 166 mg/dL — ABNORMAL HIGH (ref 70–99)
Glucose-Capillary: 212 mg/dL — ABNORMAL HIGH (ref 70–99)
Glucose-Capillary: 214 mg/dL — ABNORMAL HIGH (ref 70–99)
Glucose-Capillary: 217 mg/dL — ABNORMAL HIGH (ref 70–99)
Glucose-Capillary: 234 mg/dL — ABNORMAL HIGH (ref 70–99)
Glucose-Capillary: 268 mg/dL — ABNORMAL HIGH (ref 70–99)

## 2021-12-21 LAB — CULTURE, BLOOD (ROUTINE X 2)
Culture: NO GROWTH
Culture: NO GROWTH

## 2021-12-21 LAB — VITAMIN C: Vitamin C: 1.6 mg/dL (ref 0.4–2.0)

## 2021-12-21 LAB — CBC
HCT: 30.2 % — ABNORMAL LOW (ref 36.0–46.0)
Hemoglobin: 9.5 g/dL — ABNORMAL LOW (ref 12.0–15.0)
MCH: 31.6 pg (ref 26.0–34.0)
MCHC: 31.5 g/dL (ref 30.0–36.0)
MCV: 100.3 fL — ABNORMAL HIGH (ref 80.0–100.0)
Platelets: 158 10*3/uL (ref 150–400)
RBC: 3.01 MIL/uL — ABNORMAL LOW (ref 3.87–5.11)
RDW: 13.7 % (ref 11.5–15.5)
WBC: 13.7 10*3/uL — ABNORMAL HIGH (ref 4.0–10.5)
nRBC: 0 % (ref 0.0–0.2)

## 2021-12-21 LAB — VITAMIN B1: Vitamin B1 (Thiamine): 131.2 nmol/L (ref 66.5–200.0)

## 2021-12-21 MED ORDER — ORAL CARE MOUTH RINSE: 15.0000 mL | OROMUCOSAL | Status: DC | PRN

## 2021-12-21 MED ORDER — INSULIN ASPART 100 UNIT/ML IJ SOLN
0.0000 [IU] | INTRAMUSCULAR | Status: DC
  Administered 2021-12-21: 3 [IU] via SUBCUTANEOUS
  Administered 2021-12-21: 2 [IU] via SUBCUTANEOUS
  Administered 2021-12-21: 3 [IU] via SUBCUTANEOUS
  Administered 2021-12-22 (×4): 2 [IU] via SUBCUTANEOUS
  Administered 2021-12-23 – 2021-12-24 (×7): 1 [IU] via SUBCUTANEOUS
  Administered 2021-12-24: 2 [IU] via SUBCUTANEOUS
  Administered 2021-12-24: 1 [IU] via SUBCUTANEOUS
  Administered 2021-12-24: 3 [IU] via SUBCUTANEOUS
  Administered 2021-12-25: 1 [IU] via SUBCUTANEOUS
  Administered 2021-12-25: 3 [IU] via SUBCUTANEOUS
  Administered 2021-12-25: 2 [IU] via SUBCUTANEOUS
  Administered 2021-12-25 – 2021-12-26 (×3): 1 [IU] via SUBCUTANEOUS
  Administered 2021-12-26: 5 [IU] via SUBCUTANEOUS
  Administered 2021-12-26: 1 [IU] via SUBCUTANEOUS
  Administered 2021-12-26: 2 [IU] via SUBCUTANEOUS
  Administered 2021-12-26 (×2): 1 [IU] via SUBCUTANEOUS
  Administered 2021-12-27: 2 [IU] via SUBCUTANEOUS
  Administered 2021-12-27: 1 [IU] via SUBCUTANEOUS
  Administered 2021-12-27: 3 [IU] via SUBCUTANEOUS
  Administered 2021-12-27: 1 [IU] via SUBCUTANEOUS
  Administered 2021-12-28: 2 [IU] via SUBCUTANEOUS
  Administered 2021-12-28: 1 [IU] via SUBCUTANEOUS
  Administered 2021-12-28: 2 [IU] via SUBCUTANEOUS
  Administered 2021-12-28: 3 [IU] via SUBCUTANEOUS
  Administered 2021-12-28 (×2): 1 [IU] via SUBCUTANEOUS
  Administered 2021-12-29 (×4): 2 [IU] via SUBCUTANEOUS
  Administered 2021-12-30: 1 [IU] via SUBCUTANEOUS

## 2021-12-21 MED ORDER — ORAL CARE MOUTH RINSE
15.0000 mL | OROMUCOSAL | Status: DC
  Administered 2021-12-21 (×8): 15 mL via OROMUCOSAL

## 2021-12-21 MED ORDER — ORAL CARE MOUTH RINSE
15.0000 mL | OROMUCOSAL | Status: DC
  Administered 2021-12-21 – 2021-12-27 (×66): 15 mL via OROMUCOSAL

## 2021-12-21 NOTE — Progress Notes (Signed)
EEG maint complete.  ?

## 2021-12-21 NOTE — Progress Notes (Signed)
Subjective: No reactions to external stimuli. Continues to be off midazolam and propofol, which were tapered off yesterday.   Objective: Current vital signs: BP (!) 132/59   Pulse 80   Temp 98.6 F (37 C)   Resp (!) 21   Ht 4\' 8"  (1.422 m)   Wt 44.4 kg   SpO2 99%   BMI 21.95 kg/m  Vital signs in last 24 hours: Temp:  [97 F (36.1 C)-100 F (37.8 C)] 98.6 F (37 C) (11/05 2300) Pulse Rate:  [66-90] 80 (11/05 2300) Resp:  [12-27] 21 (11/05 2300) BP: (99-190)/(33-77) 132/59 (11/05 2300) SpO2:  [88 %-100 %] 99 % (11/05 2300) FiO2 (%):  [30 %] 30 % (11/05 2023) Weight:  [44.4 kg] 44.4 kg (11/05 0308)  Intake/Output from previous day: 11/04 0701 - 11/05 0700 In: 3073.7 [I.V.:1533.6; NG/GT:1460; IV Piggyback:80.1] Out: 130 [Stool:130] Intake/Output this shift: Total I/O In: 324.8 [I.V.:34.8; NG/GT:250; IV Piggyback:40] Out: 40 [Stool:40] Nutritional status:  Diet Order             Diet NPO time specified  Diet effective now                   General: Lying in bed, intubated HEENT: Neck is supple   Neuro: On fentanyl at 25 mcg/hr. Comatose/sedated, does not open eyes or otherwise respond to noxious stimuli, pupils small and unreactive, no doll's eye reflex, corneal reflexes absent. Does not withdraw or otherwise react to noxious stimuli in any extremity. Flaccid tone in all 4 extremities     Lab Results: Results for orders placed or performed during the hospital encounter of 12/15/21 (from the past 48 hour(s))  Glucose, capillary     Status: Abnormal   Collection Time: 12/20/21  3:18 AM  Result Value Ref Range   Glucose-Capillary 227 (H) 70 - 99 mg/dL    Comment: Glucose reference range applies only to samples taken after fasting for at least 8 hours.  Magnesium     Status: None   Collection Time: 12/20/21  5:08 AM  Result Value Ref Range   Magnesium 1.7 1.7 - 2.4 mg/dL    Comment: Performed at [facility], [place] [address], [place], [place] [address]   Phosphorus     Status: Abnormal   Collection Time: 12/20/21  5:08 AM  Result Value Ref Range   Phosphorus 2.1 (L) 2.5 - 4.6 mg/dL    Comment: Performed at [facility] [address] [place], [place] [address]  Basic metabolic panel     Status: Abnormal   Collection Time: 12/20/21  5:08 AM  Result Value Ref Range   Sodium 132 (L) 135 - 145 mmol/L   Potassium 3.4 (L) 3.5 - 5.1 mmol/L   Chloride 89 (L) 98 - 111 mmol/L   CO2 25 22 - 32 mmol/L   Glucose, Bld 196 (H) 70 - 99 mg/dL    Comment: Glucose reference range applies only to samples taken after fasting for at least 8 hours.   BUN 15 8 - 23 mg/dL   Creatinine, Ser 1.80 (H) 0.44 - 1.00 mg/dL   Calcium 8.7 (L) 8.9 - 10.3 mg/dL   GFR, Estimated 30 (L) >60 mL/min    Comment: (NOTE) Calculated using the CKD-EPI Creatinine Equation (2021)    Anion gap 18 (H) 5 - 15    Comment: Performed at [facility] [address] [place], [place] [address]  CBC     Status: Abnormal  Collection Time: 12/20/21  5:08 AM  Result Value Ref Range   WBC 14.0 (H) 4.0 - 10.5 K/uL   RBC 3.33 (L) 3.87 - 5.11 MIL/uL   Hemoglobin 10.5 (L) 12.0 - 15.0 g/dL   HCT 33.5 (L) 36.0 - 46.0 %   MCV 100.6 (H) 80.0 - 100.0 fL   MCH 31.5 26.0 - 34.0 pg   MCHC 31.3 30.0 - 36.0 g/dL   RDW 13.8 11.5 - 15.5 %   Platelets 160 150 - 400 K/uL   nRBC 0.0 0.0 - 0.2 %    Comment: Performed at [facility] [address], [place], [place] [address]  Glucose, capillary     Status: Abnormal   Collection Time: 12/20/21  7:31 AM  Result Value Ref Range   Glucose-Capillary 160 (H) 70 - 99 mg/dL    Comment: Glucose reference range applies only to samples taken after fasting for at least 8 hours.  Glucose, capillary     Status: Abnormal   Collection Time: 12/20/21 11:16 AM  Result Value Ref Range   Glucose-Capillary 253 (H) 70 - 99 mg/dL    Comment: Glucose reference range applies only to samples taken after fasting for at least 8 hours.  Glucose,  capillary     Status: Abnormal   Collection Time: 12/20/21  3:23 PM  Result Value Ref Range   Glucose-Capillary 198 (H) 70 - 99 mg/dL    Comment: Glucose reference range applies only to samples taken after fasting for at least 8 hours.  Glucose, capillary     Status: Abnormal   Collection Time: 12/20/21  7:47 PM  Result Value Ref Range   Glucose-Capillary 187 (H) 70 - 99 mg/dL    Comment: Glucose reference range applies only to samples taken after fasting for at least 8 hours.  Glucose, capillary     Status: Abnormal   Collection Time: 12/21/21 12:00 AM  Result Value Ref Range   Glucose-Capillary 137 (H) 70 - 99 mg/dL    Comment: Glucose reference range applies only to samples taken after fasting for at least 8 hours.  Basic metabolic panel     Status: Abnormal   Collection Time: 12/21/21  2:10 AM  Result Value Ref Range   Sodium 123 (L) 135 - 145 mmol/L    Comment: DELTA CHECK NOTED   Potassium 4.2 3.5 - 5.1 mmol/L   Chloride 85 (L) 98 - 111 mmol/L   CO2 24 22 - 32 mmol/L   Glucose, Bld 188 (H) 70 - 99 mg/dL    Comment: Glucose reference range applies only to samples taken after fasting for at least 8 hours.   BUN 26 (H) 8 - 23 mg/dL   Creatinine, Ser 2.39 (H) 0.44 - 1.00 mg/dL   Calcium 8.4 (L) 8.9 - 10.3 mg/dL   GFR, Estimated 22 (L) >60 mL/min    Comment: (NOTE) Calculated using the CKD-EPI Creatinine Equation (2021)    Anion gap 14 5 - 15    Comment: Performed at [facility] [address], [place], [place] [address]  CBC     Status: Abnormal   Collection Time: 12/21/21  2:10 AM  Result Value Ref Range   WBC 13.7 (H) 4.0 - 10.5 K/uL   RBC 3.01 (L) 3.87 - 5.11 MIL/uL   Hemoglobin 9.5 (L) 12.0 - 15.0 g/dL   HCT 30.2 (L) 36.0 - 46.0 %   MCV 100.3 (H) 80.0 - 100.0 fL   MCH 31.6 26.0 -  34.0 pg   MCHC 31.5 30.0 - 36.0 g/dL   RDW 13.7 11.5 - 15.5 %   Platelets 158 150 - 400 K/uL   nRBC 0.0 0.0 - 0.2 %    Comment: Performed at [facility], [place] [address] [place], [place] [address]  Glucose, capillary     Status: Abnormal   Collection Time: 12/21/21  3:40 AM  Result Value Ref Range   Glucose-Capillary 217 (H) 70 - 99 mg/dL    Comment: Glucose reference range applies only to samples taken after fasting for at least 8 hours.  Basic metabolic panel     Status: Abnormal   Collection Time: 12/21/21  5:12 AM  Result Value Ref Range   Sodium 124 (L) 135 - 145 mmol/L   Potassium 4.3 3.5 - 5.1 mmol/L   Chloride 87 (L) 98 - 111 mmol/L   CO2 25 22 - 32 mmol/L   Glucose, Bld 226 (H) 70 - 99 mg/dL    Comment: Glucose reference range applies only to samples taken after fasting for at least 8 hours.   BUN 27 (H) 8 - 23 mg/dL   Creatinine, Ser 2.46 (H) 0.44 - 1.00 mg/dL   Calcium 8.6 (L) 8.9 - 10.3 mg/dL   GFR, Estimated 21 (L) >60 mL/min    Comment: (NOTE) Calculated using the CKD-EPI Creatinine Equation (2021)    Anion gap 12 5 - 15    Comment: Performed at [facility] [address], [place], [place] [address]  Glucose, capillary     Status: Abnormal   Collection Time: 12/21/21  8:08 AM  Result Value Ref Range   Glucose-Capillary 268 (H) 70 - 99 mg/dL    Comment: Glucose reference range applies only to samples taken after fasting for at least 8 hours.  Glucose, capillary     Status: Abnormal   Collection Time: 12/21/21 11:20 AM  Result Value Ref Range   Glucose-Capillary 234 (H) 70 - 99 mg/dL    Comment: Glucose reference range applies only to samples taken after fasting for at least 8 hours.  Glucose, capillary     Status: Abnormal   Collection Time: 12/21/21  3:53 PM  Result Value Ref Range   Glucose-Capillary 214 (H) 70 - 99 mg/dL    Comment: Glucose reference range applies only to samples taken after fasting for at least 8 hours.  Glucose, capillary     Status: Abnormal   Collection Time: 12/21/21  7:22 PM  Result Value Ref Range   Glucose-Capillary 212 (H) 70 - 99 mg/dL    Comment: Glucose reference range applies only to  samples taken after fasting for at least 8 hours.  Glucose, capillary     Status: Abnormal   Collection Time: 12/21/21 11:24 PM  Result Value Ref Range   Glucose-Capillary 166 (H) 70 - 99 mg/dL    Comment: Glucose reference range applies only to samples taken after fasting for at least 8 hours.    Recent Results (from the past 240 hour(s))  MRSA Next Gen by PCR, Nasal     Status: None   Collection Time: 12/15/21  9:35 PM   Specimen: Nasal Mucosa; Nasal Swab  Result Value Ref Range Status   MRSA by PCR Next Gen NOT DETECTED NOT DETECTED Final    Comment: (NOTE) The GeneXpert MRSA Assay (FDA approved for NASAL specimens only), is one component of a comprehensive MRSA colonization surveillance program. It is not intended to diagnose MRSA infection  nor to guide or monitor treatment for MRSA infections. Test performance is not FDA approved in patients less than 41 years old. Performed at [facility], [place] [address], [place], [place] [address]   Culture, blood (Routine X 2) w Reflex to ID Panel     Status: None   Collection Time: 12/16/21  6:51 PM   Specimen: BLOOD RIGHT HAND  Result Value Ref Range Status   Specimen Description BLOOD RIGHT HAND  Final   Special Requests   Final    BOTTLES DRAWN AEROBIC ONLY Blood Culture results may not be optimal due to an inadequate volume of blood received in culture bottles   Culture   Final    NO GROWTH 5 DAYS Performed at [facility], [place] [address] [place], [place] [address]    Report Status 12/21/2021 FINAL  Final  Culture, blood (Routine X 2) w Reflex to ID Panel     Status: None   Collection Time: 12/16/21  6:58 PM   Specimen: BLOOD RIGHT ARM  Result Value Ref Range Status   Specimen Description BLOOD RIGHT ARM  Final   Special Requests   Final    BOTTLES DRAWN AEROBIC ONLY Blood Culture results may not be optimal due to an inadequate volume of blood received in culture bottles   Culture   Final    NO GROWTH 5  DAYS Performed at [facility], [place] [address], [place], [place] [address]    Report Status 12/21/2021 FINAL  Final    Lipid Panel Recent Labs    12/19/21 0339  TRIG 114    Studies/Results: No results found.  Medications: Scheduled:  Chlorhexidine Gluconate Cloth  6 each Topical Daily   docusate  100 mg Per Tube BID   famotidine  10 mg Per Tube Daily   folic acid  1 mg Per Tube Q1200   heparin injection (subcutaneous)  5,000 Units Subcutaneous Q12H   insulin aspart  0-9 Units Subcutaneous Q4H   multivitamin  1 tablet Per Tube QHS   nystatin  5 mL Oral QID   mouth rinse  15 mL Mouth Rinse Q2H   pantoprazole (PROTONIX) IV  40 mg Intravenous QHS   phenytoin (DILANTIN) IV  100 mg Intravenous Q8H   polyethylene glycol  17 g Per Tube Daily   sodium chloride flush  10-40 mL Intracatheter Q12H   Continuous:  sodium chloride 10 mL/hr at 12/21/21 2300   feeding supplement (VITAL AF 1.2 CAL) 55 mL/hr at 12/21/21 2300   fentaNYL infusion INTRAVENOUS Stopped (12/21/21 1218)   lacosamide (VIMPAT) IV Stopped (12/21/21 2212)   lacosamide (VIMPAT) IV Stopped (12/19/21 1822)   levETIRAcetam Stopped (12/19/21 1733)   levETIRAcetam 500 mg (12/21/21 0902)   midazolam Stopped (12/20/21 1716)   norepinephrine (LEVOPHED) Adult infusion Stopped (12/21/21 1205)   propofol (DIPRIVAN) infusion Stopped (12/20/21 1506)    ASSESSMENT: 66 year old female with history of epilepsy who presented with breakthrough seizures in the setting of hyponatremia (sodium 119).  MRI brain showed restricted diffusion/cortical ribboning in the left parieto-occipital region - LTM EEG report for today AM: Continuous slow, generalized. This study was suggestive of severe diffuse encephalopathy. No seizures or definitely epileptiform discharges were seen during this study. - LTM EEG report for Saturday: Burst suppression, generalized. This study was suggestive of profound diffuse encephalopathy, most likely related  to sedation. No seizures were seen during this study.  - It is unclear what patient's AED dosing is at home.  She was last seen by our team in 2020 and at that time was discharged on Dilantin 150 mg twice daily, Topamax 25 mg twice daily and Keppra 1000mg  daily with additional 500 mg postdialysis.  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  No evidence of infection (no leukocytosis, afebrile)   Recommendations: -Continue Keppra 500 mg daily with additional dose of 250 mg after dialysis -Continue Vimpat 150 mg twice daily, phenytoin 100 mg 3 times daily -Continue Dilantin at 100 mg IV TID -Continue LTM EEG one more day. Most likely can discontinue tomorrow (Monday) if there is no electrographic seizure recurrence while continuing to be off propofol and Versed -If seizures recur, can add maintenance phenobarbital -Continue seizure precautions -As needed IV Ativan 2 mg for clinical seizure-like activity   35 minutes spent in the neurological evaluation and management of this critically ill patient.     LOS: 6 days   @Electronically  signed: Dr. Kerney Elbe 12/21/2021  11:31 PM

## 2021-12-21 NOTE — Progress Notes (Signed)
[facility] Progress Note   Assessment/ Plan:   Assessment/Plan: 66 year old BF with multiple medical issues including ESRD-  now admitted with code stroke s/p TNK 1 new CVA- with lateral gaze and right sided weakness-  s/p TNK per neurology.  EEG with nonconvulsive status, per neuro 2 ESRD: normally TTS at [facility] 10/30, 11/1, will do off schedule until can get back on schedule.  Hd today.  Now that intubated and on pressors, rec leaving in fem nontunneled HD cath for now in case we need to move to CRRT.  Last HD 11/3 - VVS had been consulted, OK to use AVG away from areas of epidermolysis - next HD Monday 11/6, will place orders with check with MD in case levophed requirement increases overnight 4. Anemia of ESRD: hgb actually looks good -  anticipate it will go down with time  5. Metabolic Bone Disease: continue calcitriol , resume auryxia once eating  6.  Bleeding AVG -  as above  7. Hyperkalemia-  resolved 8.  Nonconvulsive status epilepticus: intubated, on propofol for burst suppression 9.  Dispo: needs to get pall care c/s    Subjective:     Levophed coming down at 9 mcg today.  Coming down on propofol.   Objective:   BP (!) 128/46   Pulse 77   Temp 98.6 F (37 C)   Resp 17   Ht 4\' 8"  (1.422 m)   Wt 44.4 kg   SpO2 98%   BMI 21.95 kg/m   Intake/Output Summary (Last 24 hours) at 12/21/2021 0915 Last data filed at 12/21/2021 0800 Gross per 24 hour  Intake 2864.58 ml  Output 100 ml  Net 2764.58 ml   Weight change: -0.1 kg  Physical Exam: Gen:NAD, EEG leads in place and intubated CVS: RRR Resp: clear Abd: soft Ext: R sided weakness Neuro: intubated, sedated, EEG leads in place ACCESS: R fem catheter c/d/I, L AVG + T/B, no further bleeding.  + two blisters/ skin tears on AVG  Imaging: No results found.  Labs: BMET Recent Labs  Lab 12/16/21 0212 12/16/21 0905 12/17/21 0409 12/17/21 1630 12/18/21 0358 12/18/21 1428 12/18/21 1645  12/19/21 0339 12/19/21 1835 12/20/21 0508 12/21/21 0210 12/21/21 0512  NA 128*   < > 133*  --  136 136 132* 132*  --  132* 123* 124*  K 6.1*   < > 2.8*  --  2.7* 3.8 3.4* 3.6  --  3.4* 4.2 4.3  CL 89*   < > 93*  --  93* 95*  --  98  --  89* 85* 87*  CO2 21*   < > 22  --  28 28  --  24  --  25 24 25   GLUCOSE 107*   < > 178*  --  140* 148*  --  111*  --  196* 188* 226*  BUN 57*   < > 22  --  11 15  --  20  --  15 26* 27*  CREATININE 4.45*   < > 3.05*  --  1.91* 2.22*  --  2.50*  --  1.80* 2.39* 2.46*  CALCIUM 10.1   < > 8.8*  --  9.2 8.8*  --  8.4*  --  8.7* 8.4* 8.6*  PHOS 4.0  --   --  3.9 2.6 3.0  --  3.3 1.8* 2.1*  --   --    < > = values in this interval not  displayed.   CBC Recent Labs  Lab 12/15/21 1325 12/15/21 1349 12/18/21 0358 12/18/21 1645 12/19/21 0550 12/20/21 0508 12/21/21 0210  WBC 3.5*   < > 15.9*  --  13.0* 14.0* 13.7*  NEUTROABS 2.0  --   --   --   --   --   --   HGB 12.3   < > 9.0* 9.2* 9.9* 10.5* 9.5*  HCT 39.0   < > 28.4* 27.0* 31.5* 33.5* 30.2*  MCV 100.0   < > 100.0  --  100.0 100.6* 100.3*  PLT 112*   < > 111*  --  162 160 158   < > = values in this interval not displayed.    Medications:     Chlorhexidine Gluconate Cloth  6 each Topical Daily   docusate  100 mg Per Tube BID   famotidine  10 mg Per Tube Daily   folic acid  1 mg Per Tube Q1200   heparin injection (subcutaneous)  5,000 Units Subcutaneous Q12H   insulin aspart  0-6 Units Subcutaneous Q4H   multivitamin  1 tablet Per Tube QHS   nystatin  5 mL Oral QID   mouth rinse  15 mL Mouth Rinse Q2H   pantoprazole (PROTONIX) IV  40 mg Intravenous QHS   phenytoin (DILANTIN) IV  100 mg Intravenous Q8H   polyethylene glycol  17 g Per Tube Daily   sodium chloride flush  10-40 mL Intracatheter Q12H    [name], MD 12/21/2021, 9:15 AM

## 2021-12-21 NOTE — Progress Notes (Signed)
NAME:  Erica [name], MRN:  [id number], DOB:  01-07-1956, LOS: 6 ADMISSION DATE:  12/15/2021, CONSULTATION DATE:  12/19/2021 REFERRING MD:   Ova Freshwater, ED PA, CHIEF COMPLAINT:  Altered Mental Status    History of Present Illness:  66 year old female with hx DM, CHF, HTN, seizures, ESRD on HD brought in from nursing home with AMS, R sided weakness and leftward gaze 10/30.  She was last known well around 11 AM when she was brought back to SNF from colonoscopy (which was not preformed as she was found to have snuff in her mouth).  On arrival to ED, she had left-sided gaze, right side was flaccid.  Head CT was negative and no LVO. TNKase was administered at 1349 Labs showed hyponatremia and K of 7.2, PCCM consulted for admission, transfer [facility] planned. Per nursing, at her baseline she is not able to walk due to deconditioning but is able to move all 4 extremities.   Pertinent  Medical History  ESRD on HD, left arm AV fistula Hypertension Seizure disorder, on Keppra Diabetes type 2, on insulin  Significant Hospital Events: Including procedures, antibiotic start and stop dates in addition to other pertinent events   CTA head/neck 10/30>>> 1. No emergent large vessel occlusion or proximal hemodynamically significant stenosis. 2. Normal CT perfusion. R fem HD cath 10/31>>> EEG 10/31>>> This study is suggestive of moderate to severe diffuse encephalopathy, nonspecific etiology. No seizures or epileptiform discharges were seen throughout the recording. MRI brain 10/31 >, no evidence for acute stroke, left cortical ribbon, question consistent with seizure EEG 10/31 > moderate diffuse encephalopathy, nonspecific.  No seizure activity, no epileptiform discharges 11/4 - No seizures overnight. Plan to wean off propofol today and continue home AEDs.  11/5 - No seizures overnight. Hyponatremic (124) on morning BMP.   Interim History / Subjective:  Ongoing cEEG. Weaning sedation   Objective   Blood  pressure (!) 126/57, pulse 73, temperature 98.8 F (37.1 C), resp. rate 17, height 4\' 8"  (1.422 m), weight 44.4 kg, SpO2 98 %.    Vent Mode: PRVC FiO2 (%):  [30 %] 30 % Set Rate:  [15 bmp] 15 bmp Vt Set:  [340 mL] 340 mL PEEP:  [5 cmH20] 5 cmH20 Plateau Pressure:  [15 cmH20-17 cmH20] 15 cmH20   Intake/Output Summary (Last 24 hours) at 12/21/2021 0735 Last data filed at 12/21/2021 0700 Gross per 24 hour  Intake 3073.65 ml  Output 130 ml  Net 2943.65 ml   Filed Weights   12/18/21 0214 12/20/21 0434 12/21/21 0308  Weight: 43 kg 44.5 kg 44.4 kg    Examination:  General: Critically ill appearing female lying in bed mechanically ventilated  HENT: AT\[place], eyes 26mm and sluggish, pink and moist mucus membranes  Lungs: Clear and equal bilateral breath sounds in all lobes  Cardiovascular: Regular rate and rhythm, S1 and S2, systolic murmur, no rubs or gallops  Abdomen: Flat, soft, non-distended, normoactive bowel sounds.   Extremities: warm and dry, 2+ pulses in all four extremities. AV fistula left upper arm with + thrill.    Neuro: chemically sedated, no response to painful stimuli   GU: voiding   Resolved Hospital Problem list   Anemia   Assessment & Plan:  Focal electrographic\ non-convulsive status epilepticus and acute metabolic encephalopathy with h\o epilepsy.  Plan  - Neurology following  - SBP <180  - Continue AEDs => Keppra 500mg  daily and additional 250mg  after dialysis, Vimpat 150mg  twice daily, Phenytoin 100mg  three times daily.  -  Ativan PRN clinical seizure - Propofol now off    Acute hypoxic respiratory failure Plan - VAP bundle   - Begin weaning vent settings  - Daily SBTs now sedation is weaning   - Wean FiO2 as able for SpO2 > 92%  ESRD on HD Plan - Nephrology following  - Continue HD; normal schedule Tues, Thur, Sat  - HD off schedule for now until get back on schedule  - Keep femoral non-tunneled HD cath for now in case need for CRRT   Hypotension in  the setting of increased sedation needs (history of HTN) Plan - Titrate levophed for MAP >65. Now sedation is weaning this maybe less of a requirement  - Hold Coreg and Hydralazine for now   Fluid and electrolyte imbalance with hyponatremia.  Plan   - BMP in AM  - Replace as indicated   Type II Diabetes   Plan - SSI  - Monitor for hypoglycemia   Severe malnutrition in the setting of chronic illness Plan  - Continue TF  - Palliative consult for overall goals of care   Best Practice (right click and "Reselect all SmartList Selections" daily)   Diet/type: tubefeeds DVT prophylaxis: SCD GI prophylaxis: H2B Lines: Dialysis Catheter and yes and it is still needed Foley:  N/A Code Status:  full code  Critical care time: see attending note    Audie Box NP Student

## 2021-12-21 NOTE — Procedures (Signed)
Patient Name: Erica [name]  MRN: [id number]  Epilepsy Attending: [name]  Referring Physician/Provider: [name], MD  Duration: 12/20/2021 1830 to 12/21/2021 1830   Patient history: 66 yo F with right hemiplegia with leftward gaze and aphasia.EEG to evaluate for seizure   Level of alertness: comatose    AEDs during EEG study: LEV, LCM, PHT  Technical aspects: This EEG study was done with scalp electrodes positioned according to the 10-20 International system of electrode placement. Electrical activity was reviewed with band pass filter of 1-70Hz , sensitivity of 7 uV/mm, display speed of 51mm/sec with a 60Hz  notched filter applied as appropriate. EEG data were recorded continuously and digitally stored.  Video monitoring was available and reviewed as appropriate.   Description: EEG showed near continuous generalized 3-6hz  theta-delta slowing which at times appear sharply contoured with triphasic morphology. There was intermittent 8-10Hz  alpha activity. Brief 1-3 seconds of generalized eeg attenuation was also noted.    ABNORMALITY -Continuous slow, generalized   IMPRESSION: This study was suggestive of severe diffuse encephalopathy. No seizures or definitely epileptiform discharges were seen during this study.  Nikky Duba Barbra Sarks

## 2021-12-22 ENCOUNTER — Encounter: Payer: Self-pay | Admitting: *Deleted

## 2021-12-22 DIAGNOSIS — Z515 Encounter for palliative care: Secondary | ICD-10-CM | POA: Diagnosis not present

## 2021-12-22 DIAGNOSIS — J9600 Acute respiratory failure, unspecified whether with hypoxia or hypercapnia: Secondary | ICD-10-CM

## 2021-12-22 DIAGNOSIS — R4182 Altered mental status, unspecified: Secondary | ICD-10-CM | POA: Diagnosis not present

## 2021-12-22 DIAGNOSIS — N186 End stage renal disease: Secondary | ICD-10-CM | POA: Diagnosis not present

## 2021-12-22 DIAGNOSIS — R569 Unspecified convulsions: Secondary | ICD-10-CM | POA: Diagnosis not present

## 2021-12-22 DIAGNOSIS — G40901 Epilepsy, unspecified, not intractable, with status epilepticus: Secondary | ICD-10-CM | POA: Diagnosis not present

## 2021-12-22 DIAGNOSIS — Z789 Other specified health status: Secondary | ICD-10-CM

## 2021-12-22 LAB — TRIGLYCERIDES: Triglycerides: 66 mg/dL (ref <150)

## 2021-12-22 LAB — GLUCOSE, CAPILLARY
Glucose-Capillary: 107 mg/dL — ABNORMAL HIGH (ref 70–99)
Glucose-Capillary: 154 mg/dL — ABNORMAL HIGH (ref 70–99)
Glucose-Capillary: 167 mg/dL — ABNORMAL HIGH (ref 70–99)
Glucose-Capillary: 179 mg/dL — ABNORMAL HIGH (ref 70–99)
Glucose-Capillary: 188 mg/dL — ABNORMAL HIGH (ref 70–99)
Glucose-Capillary: 98 mg/dL (ref 70–99)

## 2021-12-22 LAB — BASIC METABOLIC PANEL
Anion gap: 16 — ABNORMAL HIGH (ref 5–15)
BUN: 45 mg/dL — ABNORMAL HIGH (ref 8–23)
CO2: 23 mmol/L (ref 22–32)
Calcium: 8.8 mg/dL — ABNORMAL LOW (ref 8.9–10.3)
Chloride: 85 mmol/L — ABNORMAL LOW (ref 98–111)
Creatinine, Ser: 2.96 mg/dL — ABNORMAL HIGH (ref 0.44–1.00)
GFR, Estimated: 17 mL/min — ABNORMAL LOW (ref >60)
Glucose, Bld: 106 mg/dL — ABNORMAL HIGH (ref 70–99)
Potassium: 5 mmol/L (ref 3.5–5.1)
Sodium: 124 mmol/L — ABNORMAL LOW (ref 135–145)

## 2021-12-22 LAB — CBC
HCT: 27.5 % — ABNORMAL LOW (ref 36.0–46.0)
Hemoglobin: 8.9 g/dL — ABNORMAL LOW (ref 12.0–15.0)
MCH: 31.7 pg (ref 26.0–34.0)
MCHC: 32.4 g/dL (ref 30.0–36.0)
MCV: 97.9 fL (ref 80.0–100.0)
Platelets: 127 10*3/uL — ABNORMAL LOW (ref 150–400)
RBC: 2.81 MIL/uL — ABNORMAL LOW (ref 3.87–5.11)
RDW: 13.6 % (ref 11.5–15.5)
WBC: 9.1 10*3/uL (ref 4.0–10.5)
nRBC: 0 % (ref 0.0–0.2)

## 2021-12-22 LAB — VITAMIN A: Vitamin A (Retinoic Acid): 29.1 ug/dL (ref 22.0–69.5)

## 2021-12-22 LAB — PHENYTOIN LEVEL, FREE AND TOTAL
Phenytoin, Free: 1.1 ug/mL (ref 1.0–2.0)
Phenytoin, Total: 5.2 ug/mL — ABNORMAL LOW (ref 10.0–20.0)

## 2021-12-22 LAB — VITAMIN E
Vitamin E (Alpha Tocopherol): 7.2 mg/L — ABNORMAL LOW (ref 9.0–29.0)
Vitamin E(Gamma Tocopherol): 0.7 mg/L (ref 0.5–4.9)

## 2021-12-22 LAB — OSMOLALITY: Osmolality: 285 mOsm/kg (ref 275–295)

## 2021-12-22 MED ORDER — SODIUM CHLORIDE 0.9 % IV SOLN
50.0000 mg | INTRAVENOUS | Status: DC | PRN
  Administered 2021-12-24 – 2021-12-26 (×2): 50 mg via INTRAVENOUS
  Filled 2021-12-22 (×3): qty 5

## 2021-12-22 MED ORDER — VITAMIN E 6.75 MG/0.3ML PO SOLN
400.0000 [IU] | Freq: Every day | ORAL | Status: DC
  Administered 2021-12-22 – 2021-12-31 (×10): 400 [IU]
  Filled 2021-12-22 (×10): qty 8

## 2021-12-22 MED ORDER — SODIUM CHLORIDE 0.9 % IV SOLN
250.0000 mg | INTRAVENOUS | Status: DC | PRN
  Administered 2021-12-24 – 2021-12-26 (×2): 250 mg via INTRAVENOUS
  Filled 2021-12-22 (×2): qty 2.5

## 2021-12-22 NOTE — Progress Notes (Signed)
RN called RT stating pt's ventilator ringing low Ve. RN felt comfortable to place pt back in full ventilatory support mode in previous settings.

## 2021-12-22 NOTE — TOC Progression Note (Signed)
Transition of Care [facility]) - Initial/Assessment Note    Patient Details  Name: Erica [name] MRN: [id number] Date of Birth: 1956/01/13  Transition of Care [facility]) CM/SW Contact:    Milinda Antis, [place] Phone Number: 12/22/2021, 2:32 PM  Clinical Narrative:                 TOC following patient for d/c planning needs.  Palliative is discussing GOC with patient's family.  Lind Covert, MSW, LCSW   Patient Goals and CMS Choice        Expected Discharge Plan and Services                                                Prior Living Arrangements/Services                       Activities of Daily Living      Permission Sought/Granted                  Emotional Assessment              Admission diagnosis:  Altered mental status [R41.82] Patient Active Problem List   Diagnosis Date Noted   Status epilepticus ([place]) 12/18/2021   Esophageal dysphagia 11/10/2021   Poor appetite 11/10/2021   H/O adenomatous polyp of colon 11/10/2021   Benign essential HTN 10/17/2021   Macrocytic anemia 10/13/2021   GI bleed 10/13/2021   Elevated troponin 10/13/2021   GERD (gastroesophageal reflux disease) 10/13/2021   Colitis 10/12/2021   Atrial fibrillation ([place])    Hypothermia 06/19/2021   Abnormal EKG 06/19/2021   Right hip pain    CAD (coronary artery disease) 09/17/2020   Altered mental status    Acute metabolic encephalopathy 18/29/9371   Diarrhea    Pressure injury of skin 06/09/2020   Sacral fracture ([place]) 06/08/2020   Thrombocytopenia ([place]) 06/08/2020   Hypertensive urgency 06/08/2020   (HFpEF) heart failure with preserved ejection fraction ([place]) 06/08/2020   Fall 06/08/2020   Elevated MCV 06/08/2020   Palliative care by specialist    Generalized weakness 11/14/2019   HCAP (healthcare-associated pneumonia) 11/14/2019   Hyperkalemia 11/13/2019   Hyponatremia 09/27/2019   Closed fracture of right proximal humerus 04/20/2019    Surgery, elective    Seizure ([place]) 11/12/2018   History of hydronephrosis --stents in place 12/08/2017   Stroke ([place]) 11/29/2017   Seizures ([place])    Hydronephrosis    Recurrent UTI    Diabetes mellitus type 2 in nonobese (HCC)    ESRD (end stage renal disease) ([place])    Anemia    Chronic diastolic congestive heart failure ([place])    Essential hypertension    PICC (peripherally inserted central catheter) in place    Acute pyelonephritis 11/12/2017   Uncontrolled type 2 diabetes mellitus with hyperglycemia, with long-term current use of insulin ([place]) 11/10/2017   Renal failure 11/08/2017   Seizure disorder ([place]) 11/08/2017   Orthostatic hypotension 07/25/2014   Moderate protein malnutrition ([place]) 09/15/2013   Aspiration pneumonia ([place]) 09/15/2013   Acute respiratory failure requiring reintubation ([place]) 09/11/2013   probable Seizures due to metabolic disorder 69/67/8938   Lactic acidosis 03/19/2013   Abdominal pain 03/19/2013   Rotavirus infection 10/29/2012   Diabetes mellitus with end-stage renal disease ([place]) 10/29/2012   Protein-calorie malnutrition, severe ([place]) 10/27/2012   NSTEMI (non-ST  elevated myocardial infarction) ([place]) 10/26/2012   Fever, unspecified 10/26/2012   Hypotension 21/12/5518   Metabolic acidosis 80/22/3361   Chronic diarrhea 10/25/2012   Tobacco abuse 10/25/2012   DKA (diabetic ketoacidoses) 09/09/2012   Dehydration 09/09/2012   DKA, type 2 ([place]) 05/20/2012   Abnormal LFTs 05/20/2012   Heart murmur, systolic 22/44/9753   Hypoglycemia 00/51/1021   Metabolic encephalopathy 11/73/5670   Alcohol abuse 07/08/2011   Hypokalemia 07/08/2011   Nausea & vomiting 07/08/2011   H/O chronic pancreatitis 07/08/2011   PCP:  System, Provider Not In Pharmacy:   [place] [place] [address] Phone: [phone] Fax: [place], [place] - [address]  [place] [address] [address] Phone: [phone] Fax: [place], [place] - [address] [place] [address] Phone: [phone] Fax: [phone]     Social Determinants of Health (SDOH) Interventions    Readmission Risk Interventions    08/20/2020   11:24 AM 06/11/2020   12:31 PM 11/19/2019   11:36 AM  Readmission Risk Prevention Plan  Transportation Screening Complete Complete Complete  Medication Review (RN Care Manager) Complete Complete Complete  PCP or Specialist appointment within 3-5 days of discharge Complete  Complete  HRI or Home Care Consult Complete Complete Complete  SW Recovery Care/Counseling Consult Complete Complete Complete  Palliative Care Screening Not Applicable Not Applicable Complete  Skilled Nursing Facility Complete Complete Complete

## 2021-12-22 NOTE — Progress Notes (Signed)
[facility] Progress Note   Assessment/ Plan:   Assessment/Plan: 66 year old BF with multiple medical issues including ESRD-  now admitted with code stroke s/p TNK  1 new CVA- with lateral gaze and right sided weakness-  s/p TNK per neurology.  EEG with nonconvulsive status, per neuro 2 ESRD: normally TTS at [facility] 10/30, 11/1, will do off schedule until can get back on schedule.  Now that intubated and on pressors, rec leaving in rt fem nontunneled HD cath for now in case we need to move to CRRT.  Last HD 11/3 and on for today.  LUA AVG very medial with a bruit today but pulsatile; can just use the femoral catheter while she's here. - VVS had been consulted, OK to use AVG away from areas of epidermolysis - next HD Monday 11/6, will place orders with check with MD in case levophed requirement increases overnight 4. Anemia of ESRD: hgb actually looks good -  anticipate it will go down with time  5. Metabolic Bone Disease: continue calcitriol , resume auryxia once eating  6.  Bleeding AVG -  as above  7. Hyperkalemia-  resolved 8.  Nonconvulsive status epilepticus: intubated, on propofol for burst suppression 9.  Dispo: needs to get pall care c/s    Subjective:     Off sedation since 11/5 AM.   Objective:   BP (!) 162/65 (BP Location: Right Arm)   Pulse 81   Temp 98.1 F (36.7 C)   Resp (!) 25   Ht 4\' 8"  (1.422 m)   Wt 49.7 kg   SpO2 98%   BMI 24.56 kg/m   Intake/Output Summary (Last 24 hours) at 12/22/2021 1022 Last data filed at 12/22/2021 0900 Gross per 24 hour  Intake 1600.59 ml  Output 340 ml  Net 1260.59 ml   Weight change: 5.3 kg  Physical Exam: Gen:NAD, EEG leads in place and intubated CVS: RRR Resp: clear Abd: soft Ext: R sided weakness Neuro: intubated, sedated, EEG leads in place ACCESS: R fem temp catheter c/d/I, L AVG + B, no further bleeding.  + two blisters/ skin tears on LUA AVG which is pulsatile  Imaging: No results  found.  Labs: BMET Recent Labs  Lab 12/16/21 0212 12/16/21 0905 12/17/21 1630 12/18/21 0358 12/18/21 1428 12/18/21 1645 12/19/21 0339 12/19/21 1835 12/20/21 0508 12/21/21 0210 12/21/21 0512 12/22/21 0314  NA 128*   < >  --  136 136 132* 132*  --  132* 123* 124* 124*  K 6.1*   < >  --  2.7* 3.8 3.4* 3.6  --  3.4* 4.2 4.3 5.0  CL 89*   < >  --  93* 95*  --  98  --  89* 85* 87* 85*  CO2 21*   < >  --  28 28  --  24  --  25 24 25 23   GLUCOSE 107*   < >  --  140* 148*  --  111*  --  196* 188* 226* 106*  BUN 57*   < >  --  11 15  --  20  --  15 26* 27* 45*  CREATININE 4.45*   < >  --  1.91* 2.22*  --  2.50*  --  1.80* 2.39* 2.46* 2.96*  CALCIUM 10.1   < >  --  9.2 8.8*  --  8.4*  --  8.7* 8.4* 8.6* 8.8*  PHOS 4.0  --  3.9 2.6  3.0  --  3.3 1.8* 2.1*  --   --   --    < > = values in this interval not displayed.   CBC Recent Labs  Lab 12/15/21 1325 12/15/21 1349 12/19/21 0550 12/20/21 0508 12/21/21 0210 12/22/21 0314  WBC 3.5*   < > 13.0* 14.0* 13.7* 9.1  NEUTROABS 2.0  --   --   --   --   --   HGB 12.3   < > 9.9* 10.5* 9.5* 8.9*  HCT 39.0   < > 31.5* 33.5* 30.2* 27.5*  MCV 100.0   < > 100.0 100.6* 100.3* 97.9  PLT 112*   < > 162 160 158 127*   < > = values in this interval not displayed.    Medications:     Chlorhexidine Gluconate Cloth  6 each Topical Daily   docusate  100 mg Per Tube BID   famotidine  10 mg Per Tube Daily   folic acid  1 mg Per Tube Q1200   heparin injection (subcutaneous)  5,000 Units Subcutaneous Q12H   insulin aspart  0-9 Units Subcutaneous Q4H   multivitamin  1 tablet Per Tube QHS   nystatin  5 mL Oral QID   mouth rinse  15 mL Mouth Rinse Q2H   pantoprazole (PROTONIX) IV  40 mg Intravenous QHS   phenytoin (DILANTIN) IV  100 mg Intravenous Q8H   polyethylene glycol  17 g Per Tube Daily   sodium chloride flush  10-40 mL Intracatheter Q12H

## 2021-12-22 NOTE — Progress Notes (Signed)
Nutrition Follow-up  DOCUMENTATION CODES:   Severe malnutrition in context of chronic illness  INTERVENTION:   Continue tube feeds via Cortrak: - Vital AF 1.2 @ 55 ml/hr (1320 ml/day)  Tube feeding regimen provides 1584 kcal, 99 grams of protein, and 1071 ml of H2O.   - Continue renal MVI daily per tube  - Vitamin E solution 400 units daily per tube x 14 days for vitamin E deficiency; recommend recheck vitamin E lab after 14 days  - Awaiting results of zinc and vitamin K labs  NUTRITION DIAGNOSIS:   Severe Malnutrition related to chronic illness (ESRD, chronic pancreatitis, CHF) as evidenced by severe fat depletion, severe muscle depletion.  Ongoing, being addressed via TF  GOAL:   Patient will meet greater than or equal to 90% of their needs  Met via TF  MONITOR:   Diet advancement, Labs, Weight trends, TF tolerance, I & O's  REASON FOR ASSESSMENT:   Consult Enteral/tube feeding initiation and management  ASSESSMENT:   66 year old female who presented to the ED from SNF on 10/30 as a Code Stroke. PMH of ESRD on HD, gastroparesis, chronic pancreatitis, T2DM, CHF, HTN, HLD, neuropathy, seizures. Pt received TNK.  11/01 - Cortrak placed (tip gastric) 11/02 - TF changed to Vital AF 1.2, intubated for burst suppression  Discussed pt with RN. Next iHD planned for today. Pt off all sedation and no longer requiring low dose pressors. Tube feeds infusing at goal rate via Cortrak and pt tolerating well. Per RN, pt with small amount of diarrhea via rectal tube.  Pt with non-pitting edema to BUE.  Admit weight: 49.4 kg Current weight: 49.7 kg EDW: 41 kg  Patient is currently intubated on ventilator support MV: 6.4 L/min Temp (24hrs), Avg:98.8 F (37.1 C), Min:97 F (36.1 C), Max:100 F (37.8 C)  Medications reviewed and include: pepcid, colace, folic acid, SSI q 4 hours, rena-vit, nystatin suspension, IV protonix, IV dilantin, miralax  Vitamin/Mineral  Profile: Thiamine B1: 131.2 (WNL) Vitamin A: 29.1 (WNL) Vitamin D: 37.28 (WNL) Vitamin E (alpha tocopherol): 7.2 (low) Vitamin K: pending Vitamin C: 1.6 (WNL) Zinc: pending CRP: 11.5 (high)  Labs reviewed: sodium 124, chloride 85, phosphorus 2.1 on 12/20/21, hemoglobin 8.9, platelets 127 CBG's: 107-234 x 24 hours  Stool: 340 ml x 24 hours I/O's: +7.0 L since admit  Diet Order:   Diet Order             Diet NPO time specified  Diet effective now                   EDUCATION NEEDS:   Not appropriate for education at this time  Skin:  Skin Assessment: Skin Integrity Issues: Stage I: sacrum  Last BM:  12/22/21 rectal tube  Height:   Ht Readings from Last 1 Encounters:  12/18/21 _0  (1.422 m)    Weight:   Wt Readings from Last 1 Encounters:  12/22/21 49.7 kg    BMI:  Body mass index is 24.56 kg/m.  Estimated Nutritional Needs:   Kcal:  1500-1700  Protein:  70-85 grams  Fluid:  >1.5 L    Gustavus Bryant, MS, RD, LDN Inpatient Clinical Dietitian Please see AMiON for contact information.

## 2021-12-22 NOTE — Progress Notes (Signed)
MB performed maintenance on electrodes. All impedances are below 10k ohms. No skin breakdown noted.  

## 2021-12-22 NOTE — Progress Notes (Signed)
LTM EEG discontinued - no skin breakdown at unhook. [facility] notified 

## 2021-12-22 NOTE — Progress Notes (Signed)
Patient's son [name] updated via phone. [name] states he did not understand that patient was on the ventilator and was educated that she is at this point. Patient also has a niece in the room that is wanting updates. In contacts, only daughter and son are listed. Son, [name] states it is okay to give information to patient's niece that is currently in the room.

## 2021-12-22 NOTE — Progress Notes (Signed)
RT placed pt in CPAP/PS 10/5. Pt tolerated well w/ SVS. RN notified. RT will continue to monitor pt.

## 2021-12-22 NOTE — Progress Notes (Signed)
Subjective: No seizures overnight.  Starting to withdraw to noxious stimuli but still not opening eyes or following commands.  ROS: Unable to obtain due to poor mental status  Examination  Vital signs in last 24 hours: Temp:  [97 F (36.1 C)-100 F (37.8 C)] 97.3 F (36.3 C) (11/06 1300) Pulse Rate:  [69-90] 80 (11/06 1300) Resp:  [15-27] 20 (11/06 1300) BP: (99-164)/(51-77) 153/67 (11/06 1300) SpO2:  [96 %-100 %] 98 % (11/06 1300) FiO2 (%):  [30 %] 30 % (11/06 1300) Weight:  [49.7 kg] 49.7 kg (11/06 0146)  General: lying in bed, intubated Neuro: Does not open eyes to noxious stimuli, does not follow commands, PERRLA, corneal reflex intact, gag reflex intact, withdraws noxious stimuli in all 4 extremities  Basic Metabolic Panel: Recent Labs  Lab 12/18/21 0358 12/18/21 1428 12/18/21 1645 12/19/21 0339 12/19/21 1835 12/20/21 0508 12/21/21 0210 12/21/21 0512 12/22/21 0314  NA 136 136   < > 132*  --  132* 123* 124* 124*  K 2.7* 3.8   < > 3.6  --  3.4* 4.2 4.3 5.0  CL 93* 95*  --  98  --  89* 85* 87* 85*  CO2 28 28  --  24  --  25 24 25 23   GLUCOSE 140* 148*  --  111*  --  196* 188* 226* 106*  BUN 11 15  --  20  --  15 26* 27* 45*  CREATININE 1.91* 2.22*  --  2.50*  --  1.80* 2.39* 2.46* 2.96*  CALCIUM 9.2 8.8*  --  8.4*  --  8.7* 8.4* 8.6* 8.8*  MG 1.6* 2.1  --  2.2 1.7 1.7  --   --   --   PHOS 2.6 3.0  --  3.3 1.8* 2.1*  --   --   --    < > = values in this interval not displayed.    CBC: Recent Labs  Lab 12/18/21 0358 12/18/21 1645 12/19/21 0550 12/20/21 0508 12/21/21 0210 12/22/21 0314  WBC 15.9*  --  13.0* 14.0* 13.7* 9.1  HGB 9.0* 9.2* 9.9* 10.5* 9.5* 8.9*  HCT 28.4* 27.0* 31.5* 33.5* 30.2* 27.5*  MCV 100.0  --  100.0 100.6* 100.3* 97.9  PLT 111*  --  162 160 158 127*     Coagulation Studies: No results for input(s): "LABPROT", "INR" in the last 72 hours.  Imaging No new brain imaging overnight  ASSESSMENT AND PLAN: 66 year old female with history  of epilepsy who presented with breakthrough seizures in the setting of hyponatremia (sodium 119).  MRI brain showed restricted diffusion/cortical reddening and left parieto-occipital region   Focal electrographic/nonconvulsive status epilepticus, refractory Epilepsy with breakthrough seizure -Etiology: Hyponatremia with sodium 119.  No evidence of infection (no leukocytosis, afebrile) - Its unclear what patient's AED dosing is. She was last seen by our team in 2020 and at that time was discharged on Dilantin 150 mg twice daily, Topamax 25 mg twice daily and Keppra 1000mg  daily with additional 500 mg postdialysis.  However during current admission it appears that patient was only on Keppra 500 mg daily   Recommendations -Continue Keppra 500 mg daily with additional dose of 250 mg after dialysis -Continue Vimpat 150 mg twice daily, phenytoin 100 mg 3 times daily -We will check trough phenytoin level tomorrow.  If possible, will try to reduce phenytoin to minimize sedation  -Discontinue LTM EEG as no further seizures overnight -If seizures recur, can add maintenance phenobarb -Sedation was weaned on Saturday  evening.  Plan for hemodialysis today.  Hopefully, after dialysis, patient will have some improvement in mental status. -Continue seizure precautions -As needed IV Ativan 2 mg for clinical seizure-like activity  I have spent a total of 28 minutes with the patient reviewing hospital notes,  test results, labs and examining the patient as well as establishing an assessment and plan.  > 50% of time was spent in direct patient care.   Zeb Comfort Epilepsy [facility] For questions after 5pm please refer to AMION to reach the Neurologist on call

## 2021-12-22 NOTE — Progress Notes (Addendum)
66 year old known seizure disorder, ESRD on dialysis admitted with left gaze deviation and right-sided weakness as a code stroke, received TNK with head CT negative. She developed status epilepticus in the setting of hyponatremia, sodium 119.  MRI brain showed restricted diffusion in the left prior to occipital region. She was placed on propofol and Versed for burst suppression, this was turned off in 11/4. She is now off sedation but remains encephalopathic  On exam -no response to deep painful stimulus, no posturing, does not follow commands, S1-S2 regular, clear breath sounds bilateral, soft nontender abdomen, no edema.  Lab showed mild hyponatremia 124, mild hypokalemia, no leukocytosis  Impression/plan Status epilepticus -remains on LTM EEG, neurology following. Continue Keppra, Vimpat, Dilantin Plan to DC LTM EEG today  ESRD on hemodialysis -HD per renal, if tolerates HD today, we can DC femoral HD catheter.  Acute respiratory failure -related to status. Start continuous breathing trials with goal to extubate once mental status improves. Continue tube feeds.  Pressure injury to sacrum stage 1, not present on admission -documented by nursing on 11/1 Wound care as per protocol  Prognosis related to neuro My independent critical care time was 32 minutes.  Leanna Sato Elsworth Soho MD

## 2021-12-22 NOTE — Consult Note (Signed)
Consultation Note Date: 12/22/2021 at 51  Patient Name: Erica [name]  DOB: 1955-10-13  MRN: [id number]  Age / Sex: 66 y.o., female  PCP: System, Provider Not In Referring Physician: Rigoberto Noel, MD  Reason for Consultation: Establishing goals of care  HPI/Patient Profile: 66 y.o. female  with past medical history of seizure disorder, ESRD, HTN, DM2, COPD, dCHF, gastroparesis, gerd, heart murmur, HLD, kidney stones, neuropathic pain/neuropathy, and depression admitted on 12/15/2021 with unresponsiveness and status epilepticus. Pt is currently intubated and off of sedation with no seizure activity noted on LT EEG. As per neuro note, pt has started to withdraw to noxious stimuli but is not able to follow commands.   PMT was consulted to discuss [place].   Clinical Assessment and Goals of Care: I have reviewed medical records including EPIC notes, labs and imaging, and counselled with dayshift RN.   I assessed the patient (intubated, unable to participate in decision making at this time) and then spoke with patient's son Erica [name] over the phone to discuss diagnosis prognosis, [place], EOL wishes, disposition and options.  I introduced Palliative Medicine as specialized medical care for people living with serious illness. It focuses on providing relief from the symptoms and stress of a serious illness. The goal is to improve quality of life for both the patient and the family.  Brief medical update given. We discussed patient's current illness and what it means in the larger context of patient's on-going co-morbidities. Reviewed that patient remains off of sedation and does not currently follow commands. Reviewed pt is to receive HD today. We discussed that this will hopefully aid in establishing what patient's neuro abilities are currently.   As far as patient's medical status at baseline PTA, Erica [name] says patient  was a resident at [facility] where it "wasn't going well". He shares that there was a outbreak of Covid just prior to patient's transfer/admission to the hospital.     I attempted to elicit values and goals of care important to the patient. Calvin became tearful and took several pauses of silence to gather himself. He shares he has never discussed boundaries of care or the patient's wishes with the patient.   Erica [name] says he has one other sister, Erica [name], who has been incarcerated for several months now.    Watchful waiting discussed. Erica [name] shares he is not sure when he plans to be at the hospital again but would like to speak with PMT again tomorrow.   Discussed with Erica [name] the importance of continued conversation with family and the medical providers regarding overall plan of care and treatment options, ensuring decisions are within the context of the patient's values and GOCs.    Questions and concerns were addressed. PMT contact info given and Erica [name] was encouraged to call with questions or concerns.   Primary Decision Maker NEXT OF KIN  Physical Exam Vitals and nursing note reviewed.  Constitutional:      General: She is not in acute distress.    Appearance: She is not ill-appearing.  HENT:     Head: Normocephalic.     Mouth/Throat:     Mouth: Mucous membranes are moist.  Cardiovascular:     Rate and Rhythm: Normal rate.  Pulmonary:     Comments: MV Musculoskeletal:     Comments: does not move extremities to command  Skin:    General: Skin is warm and dry.     Palliative Assessment/Data: 30%     Thank you for this consult. Palliative medicine will continue to follow and assist holistically.   Time Total: 75 minutes Greater than 50%  of this time was spent counseling and coordinating care related to the above assessment and plan.  Signed by: Jordan Hawks, DNP, FNP-BC Palliative Medicine    Please contact Palliative Medicine Team phone at [phone] for  questions and concerns.  For individual provider: See Shea Evans

## 2021-12-22 NOTE — Progress Notes (Signed)
NAME:  Erica [name], MRN:  [id number], DOB:  Jul 29, 1955, LOS: 7 ADMISSION DATE:  12/15/2021, CONSULTATION DATE:  12/19/2021 REFERRING MD:   Erica [name] - EDP CHIEF COMPLAINT:  Altered Mental Status    History of Present Illness:  66 year old woman who presented to [place] from [facility] for scheduled EGD; this was cancelled as chewing tobacco was found in patient's mouth at the time of assessment. PMHx significant for HTN, HLD, CHF, seizure disorder (on Keppra), T2DM with peripheral neuropathy, ESRD on HD (TTS via LUE AVF).  Shortly after this, patient had L-sided gaze deviation with concomitant R-sided weakness and concern for stroke. Code Stroke was called. CT Head NAICA, TNK was administered at 1349. CTA Head/Neck negative for LVO. While in ED, post-TNK administration patient's LUE AVF dialysis access began to bleed. Pressure was applied and clamps placed. Patient was [facility] to [facility] for higher level of care.    PCCM ground team called to ED to reassess patient after [facility] ED arrival. On arrival, patient reportedly had expanding hematoma at LUE AVF access site. [facility] ED RN applied circumferential pressure which allowed for hemostasis. Dr. Virl [name] (VVS) was present at bedside; fortunately no urgent surgical intervention was needed. Ace bandage x 2 were applied for compression and the access site remained hemostatic. Due to recent TNK administration and active bleeding, temporary dialysis access was recommended (discussed with son, Erica [name], and consent obtained via phone - please see procedure note dated 10/31AM).  Pertinent Medical History:  ESRD on HD, left arm AV fistula Hypertension Seizure disorder, on Keppra Diabetes type 2, on insulin  Significant Hospital Events: Including procedures, antibiotic start and stop dates in addition to other pertinent events   10/30 - CTA Head/Neck 1. No emergent large vessel occlusion or proximal hemodynamically significant stenosis. 2. Normal CT  perfusion. 10/31 - R fem HD cath, EEG suggestive of moderate to severe diffuse encephalopathy, nonspecific etiology. No seizures or epileptiform discharges were seen throughout the recording. MRI Brain no evidence for acute stroke, left cortical ribbon, question consistent with seizure 11/4 - No seizures overnight. Plan to wean off propofol today and continue home AEDs.  11/5 - No seizures overnight. Hyponatremic (124) on morning BMP. 11/6 - Failed PSV/wean due to low Vt. Remains off of sedation since 11/5, minimally responsive. LTM EEG ongoing.   Interim History / Subjective:  No significant events overnight Off of all sedation since 11/5 Unfortunately remains minimally responsive, will give time for sedation to wash out as patient is ESRD Failed PSV/wean attempt 11/6AM due to low Vt Placed back on full support  Objective:  Blood pressure (!) 133/58, pulse 82, temperature 98.4 F (36.9 C), resp. rate (!) 24, height 4\' 8"  (1.422 m), weight 49.7 kg, SpO2 98 %.    Vent Mode: PRVC FiO2 (%):  [30 %] 30 % Set Rate:  [15 bmp] 15 bmp Vt Set:  [340 mL] 340 mL PEEP:  [5 cmH20] 5 cmH20 Plateau Pressure:  [14 cmH20-16 cmH20] 14 cmH20   Intake/Output Summary (Last 24 hours) at 12/22/2021 0944 Last data filed at 12/22/2021 0700 Gross per 24 hour  Intake 1581.69 ml  Output 340 ml  Net 1241.69 ml    Filed Weights   12/20/21 0434 12/21/21 0308 12/22/21 0146  Weight: 44.5 kg 44.4 kg 49.7 kg   Physical Examination: General: Chronically ill-appearing middle-aged woman in NAD. Cachectic. HEENT: [place]/AT, anicteric sclera, pupils equal, round 2.66mm, minimally reactive, moist mucous membranes. ETT in place. EEG leads in place. Neuro:  Minimally responsive  Withdraws to pain in all 4 extremities. Not following commands. No spontaneous movement of extremities noted on exam. +Cough and +Gag  CV: RRR, no m/g/r. PULM: Breathing even and unlabored on vent (PEEP, FiO2 30%). Lung fields CTAB anteriorly. GI:  Soft, nontender, nondistended. Normoactive bowel sounds. Extremities: No LE edema noted. LUE AVF with blister, bandage in place/Xeroform gauze, +thrill Skin: Warm/dry, no rashes.  Resolved Hospital Problem List:   Anemia   Assessment & Plan:  Focal electrographic/non-convulsive status epilepticus and acute metabolic encephalopathy History of epilepsy/seizure disorder - Neurology/Epilepsy following, appreciate recs - LTM EEG - AEDs per Neuro (Keppra 500mg  daily + additional 250mg  after HD; Vimpat 150mg  BID, Phenytoin 100mg  TID) - Ativan PRN for clinical seizure - Propofol/Versed weaned off, no sedation since 11/5  Acute hypoxic respiratory failure - Continue full vent support (4-8cc/kg IBW) - Wean FiO2 for O2 sat > 90% - Daily WUA/SBT as mental status tolerates; failed wean 11/6AM due to low Vt - VAP bundle - Pulmonary hygiene - PAD protocol for sedation: OFF in the setting of neurologic evaluation, last 11/5  ESRD on HD Hyponatremia - Nephro following - iHD per Nephro; normal schedule TTS - Keeping R femoral iHD catheter for now, as fistula with ?blister - Trend BMP - Replete electrolytes as indicated - Monitor I&Os - F/u urine studies (if able to obtain, patient primarily anuric)  Hypotension in the setting of increased sedation needs (history of HTN) - Goal MAP > 65 - Levophed titrated to goal MAP; off at present - Trend WBC, fever curve - Cx negative  Type II Diabetes Hypoglycemia - SSI - CBGs Q4H - Goal CBG 140-180  Severe malnutrition in the setting of chronic illness - TF as ordered - Palliative Care consult for ongoing [place] discussion  Best Practice: (right click and "Reselect all SmartList Selections" daily)   Diet/type: tubefeeds DVT prophylaxis: SCD GI prophylaxis: H2B Lines: Dialysis Catheter and yes and it is still needed Foley:  N/A Code Status:  full code  Critical care time:    The patient is critically ill with multiple organ system failure  and requires high complexity decision making for assessment and support, frequent evaluation and titration of therapies, advanced monitoring, review of radiographic studies and interpretation of complex data.   Critical Care Time devoted to patient care services, exclusive of separately billable procedures, described in this note is 37 minutes.  Lestine Mount, PA-C [facility] 12/22/21 9:45 AM  Please see [email or web address] for pager details.  From 7A-7P if no response, please call [phone] After hours, please call ELink [phone]

## 2021-12-22 NOTE — Procedures (Addendum)
Patient Name: Erica [name]  MRN: [id number]  Epilepsy Attending: [name]  Referring Physician/Provider: [name], MD  Duration: 12/21/2021 1830 to 12/22/2021 1442   Patient history: 66 yo F with right hemiplegia with leftward gaze and aphasia.EEG to evaluate for seizure   Level of alertness: lethargic   AEDs during EEG study: LEV, LCM, PHT  Technical aspects: This EEG study was done with scalp electrodes positioned according to the 10-20 International system of electrode placement. Electrical activity was reviewed with band pass filter of 1-70Hz , sensitivity of 7 uV/mm, display speed of 43mm/sec with a 60Hz  notched filter applied as appropriate. EEG data were recorded continuously and digitally stored.  Video monitoring was available and reviewed as appropriate.   Description: EEG showed continuous generalized 3-6hz  theta-delta slowing.  Generalized periodic discharges with triphasic morphology at 1 Hz were also noted, more prominent when awake/stimulated.  ABNORMALITY -Periodic discharges with triphasic morphology, generalized -Continuous slow, generalized   IMPRESSION: This study showed generalized periodic discharges with triphasic morphology which can be on the ictal-interictal continuum.  However, the morphology, frequency and reactivity to stimulation is more commonly due to toxic-metabolic causes. Additionally this EEG was suggestive of severe diffuse encephalopathy. No seizures or definitely epileptiform discharges were seen during this study.  Jowanda Heeg Barbra Sarks

## 2021-12-23 ENCOUNTER — Inpatient Hospital Stay (HOSPITAL_COMMUNITY): Payer: Medicare Other

## 2021-12-23 DIAGNOSIS — R4182 Altered mental status, unspecified: Secondary | ICD-10-CM | POA: Diagnosis not present

## 2021-12-23 DIAGNOSIS — Z789 Other specified health status: Secondary | ICD-10-CM | POA: Diagnosis not present

## 2021-12-23 DIAGNOSIS — J9600 Acute respiratory failure, unspecified whether with hypoxia or hypercapnia: Secondary | ICD-10-CM | POA: Diagnosis not present

## 2021-12-23 DIAGNOSIS — G40901 Epilepsy, unspecified, not intractable, with status epilepticus: Secondary | ICD-10-CM | POA: Diagnosis not present

## 2021-12-23 DIAGNOSIS — R569 Unspecified convulsions: Secondary | ICD-10-CM | POA: Diagnosis not present

## 2021-12-23 DIAGNOSIS — N186 End stage renal disease: Secondary | ICD-10-CM | POA: Diagnosis not present

## 2021-12-23 LAB — GLUCOSE, CAPILLARY
Glucose-Capillary: 114 mg/dL — ABNORMAL HIGH (ref 70–99)
Glucose-Capillary: 137 mg/dL — ABNORMAL HIGH (ref 70–99)
Glucose-Capillary: 130 mg/dL — ABNORMAL HIGH (ref 70–99)
Glucose-Capillary: 129 mg/dL — ABNORMAL HIGH (ref 70–99)
Glucose-Capillary: 139 mg/dL — ABNORMAL HIGH (ref 70–99)

## 2021-12-23 LAB — CBC
HCT: 25.8 % — ABNORMAL LOW (ref 36.0–46.0)
Hemoglobin: 8.4 g/dL — ABNORMAL LOW (ref 12.0–15.0)
MCH: 31.2 pg (ref 26.0–34.0)
MCHC: 32.6 g/dL (ref 30.0–36.0)
MCV: 95.9 fL (ref 80.0–100.0)
Platelets: 135 10*3/uL — ABNORMAL LOW (ref 150–400)
RBC: 2.69 MIL/uL — ABNORMAL LOW (ref 3.87–5.11)
RDW: 13.9 % (ref 11.5–15.5)
WBC: 9 10*3/uL (ref 4.0–10.5)
nRBC: 0 % (ref 0.0–0.2)

## 2021-12-23 LAB — BASIC METABOLIC PANEL
Anion gap: 16 — ABNORMAL HIGH (ref 5–15)
BUN: 58 mg/dL — ABNORMAL HIGH (ref 8–23)
CO2: 21 mmol/L — ABNORMAL LOW (ref 22–32)
Calcium: 8.7 mg/dL — ABNORMAL LOW (ref 8.9–10.3)
Chloride: 87 mmol/L — ABNORMAL LOW (ref 98–111)
Creatinine, Ser: 3.48 mg/dL — ABNORMAL HIGH (ref 0.44–1.00)
GFR, Estimated: 14 mL/min — ABNORMAL LOW (ref >60)
Glucose, Bld: 118 mg/dL — ABNORMAL HIGH (ref 70–99)
Potassium: 5.1 mmol/L (ref 3.5–5.1)
Sodium: 124 mmol/L — ABNORMAL LOW (ref 135–145)

## 2021-12-23 LAB — ALBUMIN: Albumin: 2.1 g/dL — ABNORMAL LOW (ref 3.5–5.0)

## 2021-12-23 LAB — MAGNESIUM: Magnesium: 1.8 mg/dL (ref 1.7–2.4)

## 2021-12-23 LAB — PHENYTOIN LEVEL, TOTAL: Phenytoin Lvl: 5.7 ug/mL — ABNORMAL LOW (ref 10.0–20.0)

## 2021-12-23 LAB — PHOSPHORUS: Phosphorus: 3.4 mg/dL (ref 2.5–4.6)

## 2021-12-23 NOTE — Progress Notes (Signed)
Subjective: No clinical seizures overnight.  ROS: Unable to obtain due to poor mental status  Examination  Vital signs in last 24 hours: Temp:  [96.8 F (36 C)-99.9 F (37.7 C)] 99.5 F (37.5 C) (11/07 1117) Pulse Rate:  [77-97] 93 (11/07 1000) Resp:  [16-28] 18 (11/07 1000) BP: (132-182)/(59-92) 165/73 (11/07 1000) SpO2:  [92 %-100 %] 98 % (11/07 1000) FiO2 (%):  [30 %] 30 % (11/07 0900) Weight:  [47.7 kg-49.7 kg] 47.7 kg (11/07 0510)  General: lying in bed, intubated Neuro: Does not open eyes to noxious stimuli, does not follow commands, PERRLA, corneal reflex intact, gag reflex intact, withdraws noxious stimuli in all 4 extremities .  Had less response to noxious stimuli in left upper extremity  Basic Metabolic Panel: Recent Labs  Lab 12/18/21 1428 12/18/21 1645 12/19/21 0339 12/19/21 1835 12/20/21 0508 12/21/21 0210 12/21/21 0512 12/22/21 0314 12/23/21 0039  NA 136   < > 132*  --  132* 123* 124* 124* 124*  K 3.8   < > 3.6  --  3.4* 4.2 4.3 5.0 5.1  CL 95*  --  98  --  89* 85* 87* 85* 87*  CO2 28  --  24  --  25 24 25 23  21*  GLUCOSE 148*  --  111*  --  196* 188* 226* 106* 118*  BUN 15  --  20  --  15 26* 27* 45* 58*  CREATININE 2.22*  --  2.50*  --  1.80* 2.39* 2.46* 2.96* 3.48*  CALCIUM 8.8*  --  8.4*  --  8.7* 8.4* 8.6* 8.8* 8.7*  MG 2.1  --  2.2 1.7 1.7  --   --   --  1.8  PHOS 3.0  --  3.3 1.8* 2.1*  --   --   --  3.4   < > = values in this interval not displayed.    CBC: Recent Labs  Lab 12/19/21 0550 12/20/21 0508 12/21/21 0210 12/22/21 0314 12/23/21 0039  WBC 13.0* 14.0* 13.7* 9.1 9.0  HGB 9.9* 10.5* 9.5* 8.9* 8.4*  HCT 31.5* 33.5* 30.2* 27.5* 25.8*  MCV 100.0 100.6* 100.3* 97.9 95.9  PLT 162 160 158 127* 135*     Coagulation Studies: No results for input(s): "LABPROT", "INR" in the last 72 hours.  Imaging No new brain imaging overnight   ASSESSMENT AND PLAN: 66 year old female with history of epilepsy who presented with breakthrough  seizures in the setting of hyponatremia (sodium 119).  MRI brain showed restricted diffusion/cortical reddening and left parieto-occipital region   Focal electrographic/nonconvulsive status epilepticus, refractory Epilepsy with breakthrough seizure -Etiology: Hyponatremia with sodium 119.  No evidence of infection (no leukocytosis, afebrile) - Its unclear what patient's AED dosing is. She was last seen by our team in 2020 and at that time was discharged on Dilantin 150 mg twice daily, Topamax 25 mg twice daily and Keppra 1000mg  daily with additional 500 mg postdialysis.  However during current admission it appears that patient was only on Keppra 500 mg daily   Recommendations -Continue Keppra 500 mg daily with additional dose of 250 mg after dialysis -Continue Vimpat 150 mg twice daily, phenytoin 100 mg 3 times daily -Continue phenytoin 100 mg every 8 hours  discontinue LTM EEG as no further seizures overnight -MRI brain without contrast to look for any acute changes due to status epilepticus -Patient had dialysis overnight.  Plan for dialysis again tomorrow.  Hopefully as sedation is dialyzed out, patient will slowly wake up.  Palliative care has been consulted for goals of care discussion.  Appreciate their help. -Continue seizure precautions -As needed IV Ativan 2 mg for clinical seizure-like activity   I have spent a total of 36 minutes with the patient reviewing hospital notes,  test results, labs and examining the patient as well as establishing an assessment and plan.  > 50% of time was spent in direct patient care.    Zeb Comfort Epilepsy [facility] For questions after 5pm please refer to AMION to reach the Neurologist on call

## 2021-12-23 NOTE — Progress Notes (Signed)
Erica [name] Progress Note   Assessment/ Plan:   Assessment/Plan: 66 year old BF with multiple medical issues including ESRD-  now admitted with code stroke s/p TNK  1 new CVA- with lateral gaze and right sided weakness-  s/p TNK per neurology.  EEG with nonconvulsive status, per neuro 2 ESRD: normally TTS at [facility] 10/30, 11/1, will do off schedule until can get back on schedule.  Now that intubated and on pressors, rec leaving in rt fem nontunneled HD cath for now in case we need to move to CRRT.  Last HD 11/3 and on for today.  LUA AVG very medial with a bruit today but pulsatile; can just use the femoral catheter while she's here. - VVS -> OK to use AVG away from areas of epidermolysis - Next HD Wed; tolerated hd Mon 11/6 w/ 2L net UF. Will use the temp cath and eventually needs angiogram to check VA given the prolonged bleeding. 4. Anemia of ESRD: hgb actually looks good -  anticipate it will go down with time  5. Metabolic Bone Disease: continue calcitriol , resume auryxia once eating  6.  Bleeding AVG -  as above (may have outflow VAS) 7. Hyperkalemia-  resolved 8.  Nonconvulsive status epilepticus: intubated, on propofol for burst suppression 9.  Dispo: needs to get pall care c/s    Subjective:     Prolonged bleeding after needles pulled post hd tx through LUA AVG.   Objective:   BP (!) 177/70   Pulse 97   Temp 99.5 F (37.5 C)   Resp (!) 24   Ht 4\' 8"  (1.422 m)   Wt 47.7 kg   SpO2 98%   BMI 23.58 kg/m   Intake/Output Summary (Last 24 hours) at 12/23/2021 0845 Last data filed at 12/23/2021 0510 Gross per 24 hour  Intake 1595.02 ml  Output 2000 ml  Net -404.98 ml   Weight change: 0 kg  Physical Exam: Gen:NAD, EEG leads in place and intubated CVS: RRR Resp: clear Abd: soft Ext: R sided weakness Neuro: intubated, sedated, EEG leads in place ACCESS: R fem temp catheter c/d/I, L AVG + B, no further bleeding.  + two blisters/ skin tears on  LUA AVG which is pulsatile, pressure bandages in place and no blood  Imaging: No results found.  Labs: BMET Recent Labs  Lab 12/17/21 1630 12/18/21 0358 12/18/21 1428 12/18/21 1645 12/19/21 0339 12/19/21 1835 12/20/21 0508 12/21/21 0210 12/21/21 0512 12/22/21 0314 12/23/21 0039  NA  --  136 136 132* 132*  --  132* 123* 124* 124* 124*  K  --  2.7* 3.8 3.4* 3.6  --  3.4* 4.2 4.3 5.0 5.1  CL  --  93* 95*  --  98  --  89* 85* 87* 85* 87*  CO2  --  28 28  --  24  --  25 24 25 23  21*  GLUCOSE  --  140* 148*  --  111*  --  196* 188* 226* 106* 118*  BUN  --  11 15  --  20  --  15 26* 27* 45* 58*  CREATININE  --  1.91* 2.22*  --  2.50*  --  1.80* 2.39* 2.46* 2.96* 3.48*  CALCIUM  --  9.2 8.8*  --  8.4*  --  8.7* 8.4* 8.6* 8.8* 8.7*  PHOS 3.9 2.6 3.0  --  3.3 1.8* 2.1*  --   --   --  3.4  CBC Recent Labs  Lab 12/20/21 0508 12/21/21 0210 12/22/21 0314 12/23/21 0039  WBC 14.0* 13.7* 9.1 9.0  HGB 10.5* 9.5* 8.9* 8.4*  HCT 33.5* 30.2* 27.5* 25.8*  MCV 100.6* 100.3* 97.9 95.9  PLT 160 158 127* 135*    Medications:     Chlorhexidine Gluconate Cloth  6 each Topical Daily   docusate  100 mg Per Tube BID   famotidine  10 mg Per Tube Daily   folic acid  1 mg Per Tube Q1200   heparin injection (subcutaneous)  5,000 Units Subcutaneous Q12H   insulin aspart  0-9 Units Subcutaneous Q4H   multivitamin  1 tablet Per Tube QHS   nystatin  5 mL Oral QID   mouth rinse  15 mL Mouth Rinse Q2H   pantoprazole (PROTONIX) IV  40 mg Intravenous QHS   phenytoin (DILANTIN) IV  100 mg Intravenous Q8H   polyethylene glycol  17 g Per Tube Daily   sodium chloride flush  10-40 mL Intracatheter Q12H   vitamin E  400 Units Per Tube Daily

## 2021-12-23 NOTE — Progress Notes (Signed)
   12/23/21 0510  Vitals  Temp 98.8 F (37.1 C)  BP (!) 182/92  BP Location Right Arm  BP Method Automatic  Patient Position (if appropriate) Lying  Pulse Rate 91  Pulse Rate Source Monitor  ECG Heart Rate 91  Resp (!) 22  Post Treatment  Dialyzer Clearance Heavily streaked  Duration of HD Treatment -hour(s) 3.5 hour(s)  Liters Processed 78.3  Fluid Removed (mL) 2000 mL  Post-Hemodialysis Comments Long bleeder may due to upper Ven. pressure, as well as pressures all the while. TX fin w/ change off blood lines and change of Ven. needle posotion.  AVG/AVF Arterial Site Held (minutes) 20 minutes  AVG/AVF Venous Site Held (minutes) 20 minutes   TX fin. W/ long bleeding and high Ven pressures all the while.

## 2021-12-23 NOTE — Progress Notes (Signed)
RT placed pt in CPAP/PS 10/5. Pt tolerating well at this time. RT will continue to monitor pt.

## 2021-12-23 NOTE — Progress Notes (Signed)
RT assisted with transport of this pt from 3M11 to MRI and back while on full ventilatory support. Pt tolerated well w/SVS and no complications. RN currently at bedside.

## 2021-12-23 NOTE — Progress Notes (Signed)
                                                     Palliative Care Progress Note, Assessment & Plan   Patient Name: Erica [name]       Date: 12/23/2021 DOB: [birth year]  Age: 66 y.o. MRN#: [id number] Attending Physician: Rigoberto Noel, MD Primary Care Physician: System, Provider Not In Admit Date: 12/15/2021  Reason for Consultation/Follow-up: Establishing goals of care  HPI: 66 y.o. female  with past medical history of seizure disorder, ESRD, HTN, DM2, COPD, dCHF, gastroparesis, gerd, heart murmur, HLD, kidney stones, neuropathic pain/neuropathy, and depression admitted on 12/15/2021 with unresponsiveness and status epilepticus. Pt is currently intubated and off of sedation with no seizure activity noted on LT EEG. As per neuro note, pt has started to withdraw to noxious stimuli but is not able to follow commands.    PMT was consulted to discuss [place].  Summary of counseling/coordination of care: After reviewing the patient's chart and assessing the patient at bedside, I counseled with dayshift RN and Dr. Elsworth Soho. Pt received HD yesterday and continues to have minimal withdrawal of LEs to painful stimulus.   Conferenced with CCM in regards to patient family communication. CCM to speak with son today to give medical update.   CCM plans to continue to let versed washout.   CCM in agreement for PMT to monitor patient peripherally until Friday/Saturday. PMT will reconvene with CCM and pt/family at that time to discuss diagnosis, prognosis, goals and boundaries of care.   Physical Exam Vitals reviewed.  Constitutional:      General: She is not in acute distress.    Appearance: She is not toxic-appearing.  HENT:     Head: Normocephalic.     Mouth/Throat:     Mouth: Mucous membranes are moist.  Cardiovascular:     Rate and Rhythm: Normal rate.      Pulses: Normal pulses.  Pulmonary:     Comments: MV Abdominal:     Palpations: Abdomen is soft.  Musculoskeletal:     Comments: Does not move extremities to command  Skin:    General: Skin is warm and dry.            Total Time 25 minutes  Greater than 50%  of this time was spent counseling and coordinating care related to the above assessment and plan.  Thank you for allowing the Palliative Medicine Team to assist in the care of this patient.  [place] Ilsa Iha, FNP-BC Palliative Medicine Team Team Phone # [phone]

## 2021-12-23 NOTE — Consult Note (Addendum)
[facility] Nurse Consult Note: Reason for Consult: Consult requested for sacrum. Pt is critically ill with multiple systemic factors which can impair healing and she is emaciated.   Wound type: Bilat buttocks and sacrum with patchy areas of dark red-purple Deep tissue pressure injuries; affected area is approx 7X9cm.  Area of loose blistered skin is beginning to evolve into full thickness tissue loss; red and moist underneath where it can be visualized.  Pressure Injury POA: No Plan: Pt is on an air mattress to reduce pressure. Topical treatment orders provided for bedside nurses to perform as follows to promote moist healing: Apply xeroform gauze to sacrum/buttocks wound Q day, then cover with foam dressing. Change foam dressings Q 3 days or PRN soiling. [place] team will assess weekly to determine if a change in the plan of care is indicated at that time.  Thank-you,  Julien Girt MSN, [place], [place], [place], [place]

## 2021-12-23 NOTE — Progress Notes (Addendum)
NAME:  Erica [name], MRN:  [id number], DOB:  1955-03-17, LOS: 8 ADMISSION DATE:  12/15/2021, CONSULTATION DATE:  12/19/2021 REFERRING MD:   Ova Freshwater - EDP CHIEF COMPLAINT:  Altered Mental Status    History of Present Illness:  66 year old woman who presented to [place] from [facility] for scheduled EGD; this was cancelled as chewing tobacco was found in patient's mouth at the time of assessment. PMHx significant for HTN, HLD, CHF, seizure disorder (on Keppra), T2DM with peripheral neuropathy, ESRD on HD (TTS via LUE AVF).  Shortly after this, patient had L-sided gaze deviation with concomitant R-sided weakness and concern for stroke. Code Stroke was called. CT Head NAICA, TNK was administered at 1349. CTA Head/Neck negative for LVO. While in ED, post-TNK administration patient's LUE AVF dialysis access began to bleed. Pressure was applied and clamps placed. Patient was transferred to [facility] for higher level of care.    PCCM ground team called to ED to reassess patient after [facility] ED arrival. On arrival, patient reportedly had expanding hematoma at LUE AVF access site. [facility] ED RN applied circumferential pressure which allowed for hemostasis. Dr. Virl Cagey (VVS) was present at bedside; fortunately no urgent surgical intervention was needed. Ace bandage x 2 were applied for compression and the access site remained hemostatic. Due to recent TNK administration and active bleeding, temporary dialysis access was recommended (discussed with son, [name], and consent obtained via phone - please see procedure note dated 10/31AM).  Pertinent Medical History:  ESRD on HD, left arm AV fistula Hypertension Seizure disorder, on Keppra Diabetes type 2, on insulin  Significant Hospital Events: Including procedures, antibiotic start and stop dates in addition to other pertinent events   10/30 - CTA Head/Neck 1. No emergent large vessel occlusion or proximal hemodynamically significant stenosis. 2. Normal CT  perfusion. 10/31 - R fem HD cath, EEG suggestive of moderate to severe diffuse encephalopathy, nonspecific etiology. No seizures or epileptiform discharges were seen throughout the recording. MRI Brain no evidence for acute stroke, left cortical ribbon, question consistent with seizure 11/4 - No seizures overnight. Plan to wean off propofol today and continue home AEDs.  11/5 - No seizures overnight. Hyponatremic (124) on morning BMP. 11/6 - Failed PSV/wean due to low Vt. Remains off of sedation since 11/5, minimally responsive. LTM EEG ongoing. 11/7 - Tolerating PSV 10/5. LTM EEG off.  Interim History / Subjective:  No significant events overnight LTM EEG discontinued, no further seizure/epileptiform activity Weaning on vent, PSV 10/5 Off sedation since 11/5 Still not very responsive on exam  Objective:  Blood pressure (!) 166/63, pulse 93, temperature 99.5 F (37.5 C), resp. rate (!) 22, height 4\' 8"  (1.422 m), weight 47.7 kg, SpO2 97 %.    Vent Mode: PRVC FiO2 (%):  [30 %] 30 % Set Rate:  [15 bmp] 15 bmp Vt Set:  [340 mL] 340 mL PEEP:  [5 cmH20] 5 cmH20 Pressure Support:  [10 cmH20] 10 cmH20 Plateau Pressure:  [14 cmH20-16 cmH20] 14 cmH20   Intake/Output Summary (Last 24 hours) at 12/23/2021 0723 Last data filed at 12/23/2021 0510 Gross per 24 hour  Intake 1660.01 ml  Output 2000 ml  Net -339.99 ml    Filed Weights   12/23/21 0020 12/23/21 0050 12/23/21 0510  Weight: 49.7 kg 49.7 kg 47.7 kg   Physical Examination: General: Chronically ill-appearing middle aged woman in NAD. HEENT: [place]/AT, anicteric sclera, pupils equal round 26mm and minimally reactive to light, moist mucous membranes. ETT/OGT in place. Neuro: Lethargic.  Does not respond to verbal, tactile or noxious stimuli. Grimaces to pain in all 4 extremities.Not following commands.+Corneal and +Cough, +weak gag CV: RRR, III/VI systolic murmur heard best at LUSB. PULM: Breathing even and unlabored on vent (PSV 10/5, FiO2  30%). Lung fields CTAB. GI: Soft, nontender, nondistended. Normoactive bowel sounds. Extremities: Trace bilateral symmetric LE edema noted. Skin: Warm/dry, no rashes.  Resolved Hospital Problem List:   Anemia   Assessment & Plan:  Focal electrographic/non-convulsive status epilepticus and acute metabolic encephalopathy History of epilepsy/seizure disorder - Neurology/Epilepsy following, appreciate recommendations - LTM EEG discontinued 11/6 - AEDs per Neuro, Keppra/Vimpat/Phenytoin - Ativan PRN for clinical seizure activity - Propofol/Versed off, off all sedation since 11/5  Acute hypoxic respiratory failure - Weaning on vent PSV 10/5, FiO2 30% 11/7AM - Wean FiO2 for O2 sat > 90% - Daily WUA/SBT as mental status tolerates - VAP bundle - Pulmonary hygiene - PAD protocol for sedation: Remains off all sedation since 11/5  ESRD on HD Hyponatremia - Nephrology following, appreciate recs - iHD per Nephro, schedule TTS - Continue R femoral IHD catheter for now, LUE AVF with area of epidermolysis - Trend BMP - Replete electrolytes as indicated - Monitor I&Os  Hypotension in the setting of increased sedation needs (history of HTN) - Goal MAP > 65 - Levophed titrated to goal MAP, remains off at present - Trend WBC, fever curve - Cx data negative  Type II Diabetes Hypoglycemia - SSI - CBGs Q4H - Goal CBG 140-180  Severe malnutrition in the setting of chronic illness - TF as ordered - Palliative care consult for ongoing goals of care discussions  Best Practice: (right click and "Reselect all SmartList Selections" daily)   Diet/type: tubefeeds DVT prophylaxis: SCD GI prophylaxis: H2B Lines: Dialysis Catheter and yes and it is still needed Foley:  N/A Code Status:  full code Last date of multidisciplinary goals of care discussion: Son updated via phone 11/7.  PMT consult in place.  Critical care time:    The patient is critically ill with multiple organ system failure  and requires high complexity decision making for assessment and support, frequent evaluation and titration of therapies, advanced monitoring, review of radiographic studies and interpretation of complex data.   Critical Care Time devoted to patient care services, exclusive of separately billable procedures, described in this note is 35 minutes.  Lestine Mount, PA-C [facility] 12/23/21 7:23 AM  Please see [email or web address] for pager details.  From 7A-7P if no response, please call [phone] After hours, please call ELink [phone]

## 2021-12-24 DIAGNOSIS — J9601 Acute respiratory failure with hypoxia: Secondary | ICD-10-CM

## 2021-12-24 DIAGNOSIS — R569 Unspecified convulsions: Secondary | ICD-10-CM | POA: Diagnosis not present

## 2021-12-24 DIAGNOSIS — N186 End stage renal disease: Secondary | ICD-10-CM | POA: Diagnosis not present

## 2021-12-24 DIAGNOSIS — G40901 Epilepsy, unspecified, not intractable, with status epilepticus: Secondary | ICD-10-CM | POA: Diagnosis not present

## 2021-12-24 DIAGNOSIS — R4182 Altered mental status, unspecified: Secondary | ICD-10-CM | POA: Diagnosis not present

## 2021-12-24 LAB — RENAL FUNCTION PANEL
Albumin: 2.1 g/dL — ABNORMAL LOW (ref 3.5–5.0)
Anion gap: 15 (ref 5–15)
BUN: 52 mg/dL — ABNORMAL HIGH (ref 8–23)
CO2: 24 mmol/L (ref 22–32)
Calcium: 8.8 mg/dL — ABNORMAL LOW (ref 8.9–10.3)
Chloride: 90 mmol/L — ABNORMAL LOW (ref 98–111)
Creatinine, Ser: 2.84 mg/dL — ABNORMAL HIGH (ref 0.44–1.00)
GFR, Estimated: 18 mL/min — ABNORMAL LOW (ref >60)
Glucose, Bld: 184 mg/dL — ABNORMAL HIGH (ref 70–99)
Phosphorus: 3.5 mg/dL (ref 2.5–4.6)
Potassium: 4.3 mmol/L (ref 3.5–5.1)
Sodium: 129 mmol/L — ABNORMAL LOW (ref 135–145)

## 2021-12-24 LAB — CBC
HCT: 26.7 % — ABNORMAL LOW (ref 36.0–46.0)
Hemoglobin: 8.4 g/dL — ABNORMAL LOW (ref 12.0–15.0)
MCH: 31.3 pg (ref 26.0–34.0)
MCHC: 31.5 g/dL (ref 30.0–36.0)
MCV: 99.6 fL (ref 80.0–100.0)
Platelets: 147 10*3/uL — ABNORMAL LOW (ref 150–400)
RBC: 2.68 MIL/uL — ABNORMAL LOW (ref 3.87–5.11)
RDW: 14.3 % (ref 11.5–15.5)
WBC: 13.5 10*3/uL — ABNORMAL HIGH (ref 4.0–10.5)
nRBC: 0 % (ref 0.0–0.2)

## 2021-12-24 LAB — GLUCOSE, CAPILLARY
Glucose-Capillary: 137 mg/dL — ABNORMAL HIGH (ref 70–99)
Glucose-Capillary: 138 mg/dL — ABNORMAL HIGH (ref 70–99)
Glucose-Capillary: 143 mg/dL — ABNORMAL HIGH (ref 70–99)
Glucose-Capillary: 146 mg/dL — ABNORMAL HIGH (ref 70–99)
Glucose-Capillary: 188 mg/dL — ABNORMAL HIGH (ref 70–99)
Glucose-Capillary: 211 mg/dL — ABNORMAL HIGH (ref 70–99)

## 2021-12-24 MED ORDER — HEPARIN SODIUM (PORCINE) 5000 UNIT/ML IJ SOLN
5000.0000 [IU] | Freq: Two times a day (BID) | INTRAMUSCULAR | Status: DC
  Administered 2021-12-25 – 2021-12-31 (×13): 5000 [IU] via SUBCUTANEOUS
  Filled 2021-12-24 (×13): qty 1

## 2021-12-24 MED ORDER — HEPARIN SODIUM (PORCINE) 1000 UNIT/ML IJ SOLN
INTRAMUSCULAR | Status: AC
  Administered 2021-12-24: 4000 [IU]
  Filled 2021-12-24: qty 1

## 2021-12-24 NOTE — Progress Notes (Signed)
HD nurse documented 2.1 mL of heparin in each lumen of HD cath. HD cath lumens labeled with 1.4 mL fill volume. Patient estimated to have received 1.4 mL extra, or 1400 units of extra heparin.   Erica [name], [facility] called, moving sq Heparin dosing for later scheduled time. E-Link notified.

## 2021-12-24 NOTE — Progress Notes (Signed)
NAME:  Erica [name], MRN:  [id number], DOB:  06/07/1955, LOS: 9 ADMISSION DATE:  12/15/2021, CONSULTATION DATE:  12/19/2021 REFERRING MD:   Ova Freshwater - EDP CHIEF COMPLAINT:  Altered Mental Status    History of Present Illness:  66 year old woman who presented to [place] from [facility] for scheduled EGD; this was cancelled as chewing tobacco was found in patient's mouth at the time of assessment. PMHx significant for HTN, HLD, CHF, seizure disorder (on Keppra), T2DM with peripheral neuropathy, ESRD on HD (TTS via LUE AVF).  Shortly after this, patient had L-sided gaze deviation with concomitant R-sided weakness and concern for stroke. Code Stroke was called. CT Head NAICA, TNK was administered at 1349. CTA Head/Neck negative for LVO. While in ED, post-TNK administration patient's LUE AVF dialysis access began to bleed. Pressure was applied and clamps placed. Patient was transferred to [facility] for higher level of care.    PCCM ground team called to ED to reassess patient after [facility] ED arrival. On arrival, patient reportedly had expanding hematoma at LUE AVF access site. [facility] ED RN applied circumferential pressure which allowed for hemostasis. Dr. Virl Cagey (VVS) was present at bedside; fortunately no urgent surgical intervention was needed. Ace bandage x 2 were applied for compression and the access site remained hemostatic. Due to recent TNK administration and active bleeding, temporary dialysis access was recommended (discussed with son, [name], and consent obtained via phone - please see procedure note dated 10/31AM).  Pertinent Medical History:  ESRD on HD, left arm AV fistula Hypertension Seizure disorder, on Keppra Diabetes type 2, on insulin  Significant Hospital Events: Including procedures, antibiotic start and stop dates in addition to other pertinent events   10/30 - CTA Head/Neck 1. No emergent large vessel occlusion or proximal hemodynamically significant stenosis. 2. Normal CT  perfusion. 10/31 - R fem HD cath, EEG suggestive of moderate to severe diffuse encephalopathy, nonspecific etiology. No seizures or epileptiform discharges were seen throughout the recording. MRI Brain no evidence for acute stroke, left cortical ribbon, question consistent with seizure 11/4 - No seizures overnight. Plan to wean off propofol today and continue home AEDs.  11/5 - No seizures overnight. Hyponatremic (124) on morning BMP. 11/6 - Failed PSV/wean due to low Vt. Remains off of sedation since 11/4, minimally responsive. LTM EEG ongoing. 11/7 - Tolerating PSV 10/5. LTM EEG off.  MRI showed increased cortical signal consistent with recent seizure  Interim History / Subjective:   Remains critically ill, intubated Off sedation since 11/4 Low-grade febrile yesterday, now defervesced  Objective:  Blood pressure (!) 162/68, pulse 95, temperature 99.9 F (37.7 C), temperature source Axillary, resp. rate (!) 24, height 4\' 8"  (1.422 m), weight 52.3 kg, SpO2 98 %.    Vent Mode: PRVC FiO2 (%):  [30 %] 30 % Set Rate:  [15 bmp] 15 bmp Vt Set:  [340 mL] 340 mL PEEP:  [5 cmH20] 5 cmH20 Plateau Pressure:  [15 cmH20-17 cmH20] 17 cmH20   Intake/Output Summary (Last 24 hours) at 12/24/2021 0932 Last data filed at 12/24/2021 0900 Gross per 24 hour  Intake 1568.74 ml  Output --  Net 1568.74 ml    Filed Weights   12/23/21 0050 12/23/21 0510 12/24/21 0400  Weight: 49.7 kg 47.7 kg 52.3 kg   Physical Examination: General: Chronically ill-appearing middle aged woman in NAD. HEENT: [place]/AT, anicteric sclera, pupils equal round 65mm and minimally reactive to light, moist mucous membranes. ETT/OGT in place. Neuro: Obtunded, tries to localize with the right hand  to deep pain stimulus, no spontaneous movement, pupils pinpoint, corneal and gag reflex present CV: RRR, III/VI systolic murmur heard best at LUSB. PULM: Clear to auscultation, no accessory muscle use GI: Soft, nontender, nondistended.  Normoactive bowel sounds. Extremities: Trace bilateral symmetric LE edema noted. Skin: Warm/dry, no rashes.  Labs from 11/7 show hyponatremia, no leukocytosis, stable anemia phenytoin level is 4.7  Resolved Hospital Problem List:   Anemia   Assessment & Plan:  Focal electrographic/non-convulsive status epilepticus and acute metabolic encephalopathy History of epilepsy/seizure disorder - Neurology/Epilepsy following, appreciate recommendations - LTM EEG discontinued 11/6 - AEDs per Neuro, Keppra/Vimpat/Phenytoin - Ativan PRN for clinical seizure activity - Propofol/Versed off, off all sedation since 11/4  Acute hypoxic respiratory failure -Continue spontaneous breathing trials but cannot extubate until mental status improves  ESRD on HD Hyponatremia - Nephrology following, appreciate recs - iHD per Nephro, schedule TTS - Continue R femoral IHD catheter for now, LUE AVF not working well, if recurrent fever or leukocytosis we will have to discontinue   Type II Diabetes Hypoglycemia - SSI - CBGs Q4H - Goal CBG 140-180  Severe malnutrition in the setting of chronic illness - TF to goal   Best Practice: (right click and "Reselect all SmartList Selections" daily)   Diet/type: tubefeeds DVT prophylaxis: SCD GI prophylaxis: H2B Lines: Dialysis Catheter and yes and it is still needed Foley:  N/A Code Status:  full code Last date of multidisciplinary goals of care discussion: Son updated via phone 11/7.  PMT following  Critical care time: 29m   The patient is critically ill with multiple organ system failure and requires high complexity decision making for assessment and support, frequent evaluation and titration of therapies, advanced monitoring, review of radiographic studies and interpretation of complex data.   Critical Care Time devoted to patient care services, exclusive of separately billable procedures, described in this note is 35 minutes.  Leanna Sato Elsworth Soho,  [facility] 12/24/21 9:32 AM  Please see [email or web address] for pager details.  From 7A-7P if no response, please call [phone] After hours, please call ELink [phone]

## 2021-12-24 NOTE — Progress Notes (Signed)
Dr Augustin Coupe notified this rn to use pt loop graft for HD procedure today, md states he has drawn and outlined area and direction to insert needles. Md states he plans to remove catheter

## 2021-12-24 NOTE — Progress Notes (Signed)
Subjective: Opening eyes today.  Plan for dialysis today.  ROS: Unable to obtain due to poor mental status  Examination  Vital signs in last 24 hours: Temp:  [98.8 F (37.1 C)-100.6 F (38.1 C)] 99.3 F (37.4 C) (11/08 1143) Pulse Rate:  [91-101] 92 (11/08 1100) Resp:  [20-27] 21 (11/08 1100) BP: (144-172)/(54-80) 160/65 (11/08 1100) SpO2:  [96 %-100 %] 98 % (11/08 1100) FiO2 (%):  [30 %] 30 % (11/08 0800) Weight:  [52.3 kg] 52.3 kg (11/08 0400)  General: lying in bed, intubated Neuro: Opens eyes to noxious stimuli, does not follow commands, PERRLA, corneal reflex intact, gag reflex intact, withdraws noxious stimuli in all 4 extremities   Basic Metabolic Panel: Recent Labs  Lab 12/18/21 1428 12/18/21 1645 12/19/21 0339 12/19/21 1835 12/20/21 0508 12/21/21 0210 12/21/21 0512 12/22/21 0314 12/23/21 0039  NA 136   < > 132*  --  132* 123* 124* 124* 124*  K 3.8   < > 3.6  --  3.4* 4.2 4.3 5.0 5.1  CL 95*  --  98  --  89* 85* 87* 85* 87*  CO2 28  --  24  --  25 24 25 23  21*  GLUCOSE 148*  --  111*  --  196* 188* 226* 106* 118*  BUN 15  --  20  --  15 26* 27* 45* 58*  CREATININE 2.22*  --  2.50*  --  1.80* 2.39* 2.46* 2.96* 3.48*  CALCIUM 8.8*  --  8.4*  --  8.7* 8.4* 8.6* 8.8* 8.7*  MG 2.1  --  2.2 1.7 1.7  --   --   --  1.8  PHOS 3.0  --  3.3 1.8* 2.1*  --   --   --  3.4   < > = values in this interval not displayed.    CBC: Recent Labs  Lab 12/19/21 0550 12/20/21 0508 12/21/21 0210 12/22/21 0314 12/23/21 0039  WBC 13.0* 14.0* 13.7* 9.1 9.0  HGB 9.9* 10.5* 9.5* 8.9* 8.4*  HCT 31.5* 33.5* 30.2* 27.5* 25.8*  MCV 100.0 100.6* 100.3* 97.9 95.9  PLT 162 160 158 127* 135*     Coagulation Studies: No results for input(s): "LABPROT", "INR" in the last 72 hours.  Imaging MRI brain without contrast 12/23/2021: Increased cortical signal on diffusion-weighted sequence and T2 weighted sequences in the left frontoparietal region, new compared to 12/16/2021, favored to  represent sequela of a recent seizure, given the distribution. No other acute intracranial process.   ASSESSMENT AND PLAN: 66 year old female with history of epilepsy who presented with breakthrough seizures in the setting of hyponatremia (sodium 119).  MRI brain showed restricted diffusion/cortical reddening and left parieto-occipital region   Focal electrographic/nonconvulsive status epilepticus, refractory, resolved Epilepsy with breakthrough seizure -Etiology: Hyponatremia with sodium 119.  No evidence of infection (no leukocytosis, afebrile) - Its unclear what patient's AED dosing is. She was last seen by our team in 2020 and at that time was discharged on Dilantin 150 mg twice daily, Topamax 25 mg twice daily and Keppra 1000mg  daily with additional 500 mg postdialysis.  However during current admission it appears that patient was only on Keppra 500 mg daily -MRI changes are likely due to prolonged status epilepticus.   Recommendations -Continue Keppra 500 mg daily with additional dose of 250 mg after dialysis -Continue Vimpat 150 mg twice daily, phenytoin 100 mg 3 times daily -Continue phenytoin 100 mg every 8 hours  -Patient has had some improvement in mental status and  opening eyes.  However given her age, other comorbidities, poor neurological reserve, anticipate prolonged.  For recovery.  Appreciate palliative care team's input with goals of care discussions -Continue seizure precautions -As needed IV Ativan 2 mg for clinical seizure-like activity   I have spent a total of 26 minutes with the patient reviewing hospital notes,  test results, labs and examining the patient as well as establishing an assessment and plan.  > 50% of time was spent in direct patient care.    Zeb Comfort Epilepsy [facility] For questions after 5pm please refer to AMION to reach the Neurologist on call

## 2021-12-24 NOTE — Progress Notes (Addendum)
This rn arrived to 3M11 to perform HD procedure. Pt opens eyes inconsistently to voice and painful stimuli.swelling noted left arm graft. Informed consent signed and in chart.   Treatment initiated: 1223 first treatment, 1605 second treatment Treatment completed: 1.51 minutes first treatment, 1.30 second treatment  Patient tolerated well. Changed HD cartridge twice, changed out HD machine and changed from graft left arm to right femoral catheter d/t blood error alarm  Opens eyes inconsistently, no acute distress.  Hand-off given to patient's nurse.   Access used: graft left arm 1.51 minutes, right fem catheter 1.30 minutes Access issues: yes. Swelling left arm graft   Total UF removed: 0.9 liters first treatment, 1.1  liters second treatment Medication(s) given: none Post HD weight:    [facility]  12/24/21 1735  Vitals  Temp 98.6 F (37 C)  Temp Source Axillary  BP (!) 156/70  MAP (mmHg) 94  ECG Heart Rate 98  Resp (!) 30  Oxygen Therapy  SpO2 95 %  O2 Device Ventilator  FiO2 (%) 30 %  MEWS Score  MEWS Temp 0  MEWS Systolic 0  MEWS Pulse 0  MEWS RR 2  MEWS LOC 1  MEWS Score 3  MEWS Score Color Yellow

## 2021-12-24 NOTE — Progress Notes (Addendum)
This rn called [facility] [phone] regarding frequent blood error alarm. Informed intake person a/v pressures wnl, connections checked, lines checked for kinks, a/v patency checked, a/v lines push and pull with good blood return and no resistance, changed cartridge once and decreased bfr to 350. Per intake person service category; Escalated, Case number; [id number].

## 2021-12-24 NOTE — Progress Notes (Signed)
HD delay; another machine retrieved to set up and prime d/t current machine delivered to bedside for procedure needing a new filter

## 2021-12-24 NOTE — Progress Notes (Signed)
ICU bedside nurse called and spoke to Dr Augustin Coupe regarding blood error alarms and assessment of graft left arm done by two different HD nurses. ICU nurse states md okay with using right femoral catheter for HD procedure today.

## 2021-12-24 NOTE — Progress Notes (Signed)
[facility] Progress Note   Assessment/ Plan:   Assessment/Plan: 66 year old BF with multiple medical issues including ESRD-  now admitted with code stroke s/p TNK  1 new CVA- with lateral gaze and right sided weakness-  s/p TNK per neurology.  EEG with nonconvulsive status, per neuro 2 ESRD: normally TTS at [facility]'; lt upper arm loop with bypass graft in arterial limb placed on 11/04/2021. - HD 10/30, 11/1, will do off schedule until can get back on schedule.  Now that intubated and on pressors, rec leaving in rt fem nontunneled HD cath for now in case we need to move to CRRT but if we are able to use the graft consistently then prob can remove the cath. Doesn't appear that she needs CRRT at this time. Will attempt cannulation of the arterial limb which was cleared by Dr. Donnetta Hutching on 10/18 to cannulate. Will make cannulation much easier. I also used a black marker to show where the arterial limb is + direction of blood flow.  On for HD today.   4. Anemia of ESRD: hgb actually looks good -  anticipate it will go down with time  5. Metabolic Bone Disease: continue calcitriol , resume auryxia once eating  6.  Bleeding AVG -  as above (may have outflow VAS) 7. Hyperkalemia-  resolved 8.  Nonconvulsive status epilepticus: intubated, on propofol for burst suppression 9.  Dispo: needs to get pall care c/s    Subjective:     Prolonged bleeding after needles pulled post hd tx through LUA AVG previously but that was from cannulation of medial venous limb.   Objective:   BP (!) 162/68   Pulse 95   Temp 99.9 F (37.7 C) (Axillary)   Resp (!) 24   Ht 4\' 8"  (1.422 m)   Wt 52.3 kg   SpO2 98%   BMI 25.85 kg/m   Intake/Output Summary (Last 24 hours) at 12/24/2021 0950 Last data filed at 12/24/2021 0900 Gross per 24 hour  Intake 1568.74 ml  Output --  Net 1568.74 ml   Weight change: 2.6 kg  Physical Exam: Gen:NAD, intubated CVS: RRR Resp: clear Abd: soft Ext: R sided  weakness Neuro: intubated  ACCESS: R fem temp catheter c/d/I, Lt loop AVG (arterial limb is lateral) + B, no further bleeding.  + two blisters/ skin tears on medial aspect (venous limb)   Imaging: MR BRAIN WO CONTRAST  Result Date: 12/23/2021 CLINICAL DATA:  Seizure EXAM: MRI HEAD WITHOUT CONTRAST TECHNIQUE: Multiplanar, multiecho pulse sequences of the brain and surrounding structures were obtained without intravenous contrast. COMPARISON:  12/16/2021 FINDINGS: Evaluation is somewhat limited by motion artifact. Brain: Increased cortical signal on diffusion-weighted imaging in the left frontoparietal region without definite ADC correlate (series 5, image 85), which correlates with increased T2 signal (series 11, image 17), new compared to 12/16/2021. No acute hemorrhage, mass, mass effect, or midline shift. No hydrocephalus or extra-axial collection. Scattered T2 hyperintense signal in the periventricular white matter, likely the sequela of mild-to-moderate chronic small vessel ischemic disease. The hippocampi are symmetric in size and signal. No heterotopia or evidence of cortical dysgenesis. Vascular: Normal arterial flow voids. Skull and upper cervical spine: Normal marrow signal. Sinuses/Orbits: No acute finding. Other: Fluid in the bilateral mastoid air cells. IMPRESSION: 1. Increased cortical signal on diffusion-weighted sequence and T2 weighted sequences in the left frontoparietal region, new compared to 12/16/2021, favored to represent sequela of a recent seizure, given the distribution. 2. No other acute  intracranial process. These results will be called to the ordering clinician or representative by the Radiologist Assistant, and communication documented in the PACS or [employer]. Electronically Signed   By: Merilyn Baba M.D.   On: 12/23/2021 20:54    Labs: BMET Recent Labs  Lab 12/17/21 1630 12/18/21 0358 12/18/21 0358 12/18/21 1428 12/18/21 1645 12/19/21 0339 12/19/21 1835  12/20/21 0508 12/21/21 0210 12/21/21 0512 12/22/21 0314 12/23/21 0039  NA  --  136   < > 136 132* 132*  --  132* 123* 124* 124* 124*  K  --  2.7*   < > 3.8 3.4* 3.6  --  3.4* 4.2 4.3 5.0 5.1  CL  --  93*   < > 95*  --  98  --  89* 85* 87* 85* 87*  CO2  --  28   < > 28  --  24  --  25 24 25 23  21*  GLUCOSE  --  140*   < > 148*  --  111*  --  196* 188* 226* 106* 118*  BUN  --  11   < > 15  --  20  --  15 26* 27* 45* 58*  CREATININE  --  1.91*   < > 2.22*  --  2.50*  --  1.80* 2.39* 2.46* 2.96* 3.48*  CALCIUM  --  9.2   < > 8.8*  --  8.4*  --  8.7* 8.4* 8.6* 8.8* 8.7*  PHOS 3.9 2.6  --  3.0  --  3.3 1.8* 2.1*  --   --   --  3.4   < > = values in this interval not displayed.   CBC Recent Labs  Lab 12/20/21 0508 12/21/21 0210 12/22/21 0314 12/23/21 0039  WBC 14.0* 13.7* 9.1 9.0  HGB 10.5* 9.5* 8.9* 8.4*  HCT 33.5* 30.2* 27.5* 25.8*  MCV 100.6* 100.3* 97.9 95.9  PLT 160 158 127* 135*    Medications:     Chlorhexidine Gluconate Cloth  6 each Topical Daily   famotidine  10 mg Per Tube Daily   folic acid  1 mg Per Tube Q1200   heparin injection (subcutaneous)  5,000 Units Subcutaneous Q12H   insulin aspart  0-9 Units Subcutaneous Q4H   multivitamin  1 tablet Per Tube QHS   mouth rinse  15 mL Mouth Rinse Q2H   pantoprazole (PROTONIX) IV  40 mg Intravenous QHS   phenytoin (DILANTIN) IV  100 mg Intravenous Q8H   polyethylene glycol  17 g Per Tube Daily   sodium chloride flush  10-40 mL Intracatheter Q12H   vitamin E  400 Units Per Tube Daily

## 2021-12-24 NOTE — Progress Notes (Signed)
HD nurse to bedside to asst with trouble shooting blood error alarm, nurse assess alarm to be r/t  resistance in pull and flush with venous line. My assessment notes no resistance in pull and flush with venous line, HD lines disconnected in preparation for decannulation. HD tech to bedside prior to decannulation and notes no resistance in pull or push in a/v lines.

## 2021-12-25 ENCOUNTER — Inpatient Hospital Stay (HOSPITAL_COMMUNITY): Payer: Medicare Other

## 2021-12-25 DIAGNOSIS — G40901 Epilepsy, unspecified, not intractable, with status epilepticus: Secondary | ICD-10-CM | POA: Diagnosis not present

## 2021-12-25 DIAGNOSIS — N186 End stage renal disease: Secondary | ICD-10-CM | POA: Diagnosis not present

## 2021-12-25 DIAGNOSIS — J9600 Acute respiratory failure, unspecified whether with hypoxia or hypercapnia: Secondary | ICD-10-CM | POA: Diagnosis not present

## 2021-12-25 LAB — GLUCOSE, CAPILLARY
Glucose-Capillary: 135 mg/dL — ABNORMAL HIGH (ref 70–99)
Glucose-Capillary: 147 mg/dL — ABNORMAL HIGH (ref 70–99)
Glucose-Capillary: 218 mg/dL — ABNORMAL HIGH (ref 70–99)
Glucose-Capillary: 165 mg/dL — ABNORMAL HIGH (ref 70–99)
Glucose-Capillary: 128 mg/dL — ABNORMAL HIGH (ref 70–99)

## 2021-12-25 LAB — CBC WITH DIFFERENTIAL/PLATELET
Abs Immature Granulocytes: 0.09 10*3/uL — ABNORMAL HIGH (ref 0.00–0.07)
Basophils Absolute: 0 10*3/uL (ref 0.0–0.1)
Basophils Relative: 0 %
Eosinophils Absolute: 0.1 10*3/uL (ref 0.0–0.5)
Eosinophils Relative: 1 %
HCT: 23.4 % — ABNORMAL LOW (ref 36.0–46.0)
Hemoglobin: 7.5 g/dL — ABNORMAL LOW (ref 12.0–15.0)
Immature Granulocytes: 1 %
Lymphocytes Relative: 7 %
Lymphs Abs: 1.2 10*3/uL (ref 0.7–4.0)
MCH: 31.8 pg (ref 26.0–34.0)
MCHC: 32.1 g/dL (ref 30.0–36.0)
MCV: 99.2 fL (ref 80.0–100.0)
Monocytes Absolute: 1.5 10*3/uL — ABNORMAL HIGH (ref 0.1–1.0)
Monocytes Relative: 8 %
Neutro Abs: 15.6 10*3/uL — ABNORMAL HIGH (ref 1.7–7.7)
Neutrophils Relative %: 83 %
Platelets: 140 10*3/uL — ABNORMAL LOW (ref 150–400)
RBC: 2.36 MIL/uL — ABNORMAL LOW (ref 3.87–5.11)
RDW: 14.4 % (ref 11.5–15.5)
WBC: 18.6 10*3/uL — ABNORMAL HIGH (ref 4.0–10.5)
nRBC: 0 % (ref 0.0–0.2)

## 2021-12-25 LAB — BASIC METABOLIC PANEL
Anion gap: 16 — ABNORMAL HIGH (ref 5–15)
BUN: 35 mg/dL — ABNORMAL HIGH (ref 8–23)
CO2: 23 mmol/L (ref 22–32)
Calcium: 8.4 mg/dL — ABNORMAL LOW (ref 8.9–10.3)
Chloride: 90 mmol/L — ABNORMAL LOW (ref 98–111)
Creatinine, Ser: 1.85 mg/dL — ABNORMAL HIGH (ref 0.44–1.00)
GFR, Estimated: 30 mL/min — ABNORMAL LOW (ref >60)
Glucose, Bld: 160 mg/dL — ABNORMAL HIGH (ref 70–99)
Potassium: 3.6 mmol/L (ref 3.5–5.1)
Sodium: 129 mmol/L — ABNORMAL LOW (ref 135–145)

## 2021-12-25 LAB — PHOSPHORUS: Phosphorus: 2.6 mg/dL (ref 2.5–4.6)

## 2021-12-25 LAB — MAGNESIUM: Magnesium: 1.7 mg/dL (ref 1.7–2.4)

## 2021-12-25 MED ORDER — VANCOMYCIN HCL 1250 MG/250ML IV SOLN
1250.0000 mg | Freq: Once | INTRAVENOUS | Status: AC
  Administered 2021-12-25: 1250 mg via INTRAVENOUS
  Filled 2021-12-25: qty 250

## 2021-12-25 MED ORDER — PIPERACILLIN-TAZOBACTAM IN DEX 2-0.25 GM/50ML IV SOLN
2.2500 g | Freq: Three times a day (TID) | INTRAVENOUS | Status: DC
  Administered 2021-12-25 – 2021-12-27 (×8): 2.25 g via INTRAVENOUS
  Filled 2021-12-25 (×9): qty 50

## 2021-12-25 MED ORDER — VANCOMYCIN HCL 500 MG/100ML IV SOLN
500.0000 mg | INTRAVENOUS | Status: DC
  Administered 2021-12-26: 500 mg via INTRAVENOUS
  Filled 2021-12-25: qty 100

## 2021-12-25 NOTE — Progress Notes (Signed)
Pharmacy Antibiotic Note  Erica [name] is a 66 y.o. female admitted on 12/15/2021 with AMS, unresponsiveness and Code stroke was called.  Went into status and seizure has resolved.  Now with a fever of 102.9 and leukocytosis.  Pharmacy has been consulted for vancomycin and Zosyn dosing as empiric therapy.  Patient has ESRD on MWF HD currently (TTS schedule outpatient).  WBC up to 18.6.  Plan: Vanc 1250mg  IV x 1, then 500mg  IV qHD MWF Zosyn 2.25gm IV Q8H Monitor HD schedule/tolerance, micro data, vanc level as needed  Height: 4\' 8"  (142.2 cm) Weight: 52.3 kg (115 lb 4.8 oz) IBW/kg (Calculated) : 36.3  Temp (24hrs), Avg:99.8 F (37.7 C), Min:98.5 F (36.9 C), Max:102.9 F (39.4 C)  Recent Labs  Lab 12/21/21 0210 12/21/21 0512 12/22/21 0314 12/23/21 0039 12/24/21 1231 12/25/21 0327 12/25/21 0355  WBC 13.7*  --  9.1 9.0 13.5*  --  18.6*  CREATININE 2.39* 2.46* 2.96* 3.48* 2.84* 1.85*  --     Estimated Creatinine Clearance: 19.9 mL/min (A) (by C-G formula based on SCr of 1.85 mg/dL (H)).    Allergies  Allergen Reactions   Aspirin Palpitations and Other (See Comments)    Listed on [facility] 11/12/18   Metformin And Related    Nystatin 11/2 >> 11/8 Vanc 11/9 >> Zosyn 11/9 >>   10/30 MRSA PCR - negative 10/31 BCx - neg 11/9 BCx -  11/9 TA -   Leoma Folds D. Mina Marble, PharmD, BCPS, [place] 12/25/2021, 8:45 AM

## 2021-12-25 NOTE — Progress Notes (Signed)
NAME:  Erica [name], MRN:  [id number], DOB:  Sep 21, 1955, LOS: 25 ADMISSION DATE:  12/15/2021, CONSULTATION DATE:  12/19/2021 REFERRING MD:   Ova Freshwater - EDP CHIEF COMPLAINT:  Altered Mental Status    History of Present Illness:  66 year old woman who presented to [place] from [facility] SNF for scheduled EGD; this was cancelled as chewing tobacco was found in patient's mouth at the time of assessment. PMHx significant for HTN, HLD, CHF, seizure disorder (on Keppra), T2DM with peripheral neuropathy, ESRD on HD (TTS via LUE AVF).  Shortly after this, patient had L-sided gaze deviation with concomitant R-sided weakness and concern for stroke. Code Stroke was called. CT Head NAICA, TNK was administered at 1349. CTA Head/Neck negative for LVO. While in ED, post-TNK administration patient's LUE AVF dialysis access began to bleed. Pressure was applied and clamps placed. Patient was transferred to [facility] for higher level of care.    PCCM ground team called to ED to reassess patient after [facility] ED arrival. On arrival, patient reportedly had expanding hematoma at LUE AVF access site. [facility] ED RN applied circumferential pressure which allowed for hemostasis. Dr. Virl Cagey (VVS) was present at bedside; fortunately no urgent surgical intervention was needed. Ace bandage x 2 were applied for compression and the access site remained hemostatic. Due to recent TNK administration and active bleeding, temporary dialysis access was recommended (discussed with son, [name], and consent obtained via phone - please see procedure note dated 10/31AM).  Pertinent Medical History:  ESRD on HD, left arm AV fistula Hypertension Seizure disorder, on Keppra Diabetes type 2, on insulin  Significant Hospital Events: Including procedures, antibiotic start and stop dates in addition to other pertinent events   10/30 - CTA Head/Neck 1. No emergent large vessel occlusion or proximal hemodynamically significant stenosis. 2. Normal CT  perfusion. 10/31 - R fem HD cath, EEG suggestive of moderate to severe diffuse encephalopathy, nonspecific etiology. No seizures or epileptiform discharges were seen throughout the recording. MRI Brain no evidence for acute stroke, left cortical ribbon, question consistent with seizure 11/4 - No seizures overnight. Plan to wean off propofol today and continue home AEDs.  11/5 - No seizures overnight. Hyponatremic (124) on morning BMP. 11/6 - Failed PSV/wean due to low Vt. Remains off of sedation since 11/4, minimally responsive. LTM EEG ongoing. 11/7 - Tolerating PSV 10/5. LTM EEG off.  MRI showed increased cortical signal consistent with recent seizure  Interim History / Subjective:   Remains critically ill, intubated Off sedation since 11/4 Febrile 102.9 Starting to wake up  Objective:  Blood pressure (!) 157/71, pulse 96, temperature 99.8 F (37.7 C), temperature source Axillary, resp. rate (!) 21, height 4\' 8"  (1.422 m), weight 52.3 kg, SpO2 100 %.    Vent Mode: PRVC FiO2 (%):  [30 %] 30 % Set Rate:  [15 bmp] 15 bmp Vt Set:  [340 mL] 340 mL PEEP:  [5 cmH20] 5 cmH20 Plateau Pressure:  [14 cmH20-16 cmH20] 16 cmH20   Intake/Output Summary (Last 24 hours) at 12/25/2021 1012 Last data filed at 12/25/2021 0600 Gross per 24 hour  Intake 1258.27 ml  Output 1100.9 ml  Net 157.37 ml    Filed Weights   12/23/21 0050 12/23/21 0510 12/24/21 0400  Weight: 49.7 kg 47.7 kg 52.3 kg   Physical Examination: General: Chronically ill-appearing elderly woman in NAD. HEENT: Edie/AT, anicteric sclera, pupils equal round 61mm and minimally reactive to light, moist mucous membranes. ETT/OGT in place. Neuro: Opens eyes to name, sticks tongue out  to command, unable to move extremities CV: RRR, III/VI systolic murmur heard best at LUSB. PULM: Clear to auscultation, no accessory muscle use GI: Soft, nontender, nondistended. Normoactive bowel sounds. Extremities: Trace bilateral symmetric LE edema  noted. Skin: Warm/dry, no rashes.  Labs show increase leukocytosis, persistent hyponatremia, improved BUN  Resolved Hospital Problem List:   Anemia   Assessment & Plan:  Focal electrographic/non-convulsive status epilepticus and acute metabolic encephalopathy History of epilepsy/seizure disorder Finally starting to wake up since 11/8 and following commands today - Neurology/Epilepsy following, appreciate recommendations - LTM EEG discontinued 11/6 - AEDs per Neuro, Keppra/Vimpat/Phenytoin - Ativan PRN for clinical seizure activity - off all sedation since 11/4  Acute hypoxic respiratory failure -Continue spontaneous breathing trials, getting closer to extubation, would like her to be more awake  ESRD on HD Hyponatremia - Nephrology following, appreciate recs - iHD per Nephro, schedule TTS - dc R femoral IHD catheter for now, LUE AVF not working well, given recurrent fever or leukocytosis   New fever and leukocytosis -Add broad-spectrum antibiotics, DC femoral HD cath.  Type II Diabetes Hypoglycemia - dc SSI - CBGs Q4H - Goal CBG 140-180  Severe malnutrition in the setting of chronic illness - TF to goal   Best Practice: (right click and "Reselect all SmartList Selections" daily)   Diet/type: tubefeeds DVT prophylaxis: SCD GI prophylaxis: H2B Lines: Dialysis Catheter and yes and it is still needed Foley:  N/A Code Status:  full code Last date of multidisciplinary goals of care discussion: Son updated via phone 11/7.  PMT following  Critical care time: 14m   The patient is critically ill with multiple organ system failure and requires high complexity decision making for assessment and support, frequent evaluation and titration of therapies, advanced monitoring, review of radiographic studies and interpretation of complex data.   Critical Care Time devoted to patient care services, exclusive of separately billable procedures, described in this note is 32  minutes.  Leanna Sato Elsworth Soho, MD [facility] 12/25/21 10:12 AM  Please see [email or web address] for pager details.  From 7A-7P if no response, please call [phone] After hours, please call ELink [phone]

## 2021-12-25 NOTE — Progress Notes (Signed)
[facility] Progress Note   Assessment/ Plan:   Assessment/Plan: 66 year old BF with multiple medical issues including ESRD-  now admitted with code stroke s/p TNK  1 new CVA- with lateral gaze and right sided weakness-  s/p TNK per neurology.  EEG with nonconvulsive status, per neuro 2 ESRD: normally TTS at [facility]'; lt upper arm loop with bypass graft in arterial limb placed on 11/04/2021. - HD 10/30, 11/1, will do off schedule until can get back on schedule.    Removing the rt fem temp given fevers and we can try to utilize the AVG again along the medial aspect. No luck accessing the lateral bypass graft on 11/8 (only ran for 1hr 20 min.  No absolute indication for RRT; plan on HD tomorrow. Will attempt to access to medial portion of the graft; can always use the arterial needle in the arterial limb of the graft which is lateral (needle pointed up - cephalad) with venous needle in the venous limb (medial in arm) with needle also pointed up (cephalad)  4. Anemia of ESRD: hgb actually looks good -  anticipate it will go down with time  5. Metabolic Bone Disease: continue calcitriol , resume auryxia once eating  6.  Bleeding AVG -  as above (may have outflow VAS) 7. Hyperkalemia-  resolved 8.  Nonconvulsive status epilepticus: intubated, on propofol for burst suppression 9.  Dispo: needs to get pall care c/s    Subjective:     Prolonged bleeding after needles pulled post hd tx through LUA AVG previously but that was from cannulation of medial venous limb. Infiltration higher in arm but no e/o active bleed   Objective:   BP (!) 161/75   Pulse 92   Temp 99.8 F (37.7 C) (Axillary)   Resp (!) 22   Ht 4\' 8"  (1.422 m)   Wt 52.3 kg   SpO2 100%   BMI 25.85 kg/m   Intake/Output Summary (Last 24 hours) at 12/25/2021 1123 Last data filed at 12/25/2021 1100 Gross per 24 hour  Intake 1683.2 ml  Output 1100.9 ml  Net 582.3 ml   Weight change:   Physical  Exam: Gen:NAD, intubated CVS: RRR Resp: clear Abd: soft Ext: R sided weakness Neuro: intubated  ACCESS: Lt loop AVG (arterial limb is lateral) + B, no further bleeding.  + two blisters/ skin tears on medial aspect (venous limb), infiltration higher in arm but no active bleed  Imaging: DG CHEST PORT 1 VIEW  Result Date: 12/25/2021 CLINICAL DATA:  Respiratory failure EXAM: PORTABLE CHEST 1 VIEW COMPARISON:  Previous studies including the examination of 12/18/2021 FINDINGS: Transverse diameter of heart is slightly increased. There are no signs of pulmonary edema or new focal infiltrates. There is no pleural effusion or pneumothorax. Tip of endotracheal tube is 3.6 cm above the carina. Distal portion of enteric tube is seen in the stomach. IMPRESSION: There are no new infiltrates or signs of pulmonary edema. Electronically Signed   By: Elmer Picker M.D.   On: 12/25/2021 09:30   MR BRAIN WO CONTRAST  Result Date: 12/23/2021 CLINICAL DATA:  Seizure EXAM: MRI HEAD WITHOUT CONTRAST TECHNIQUE: Multiplanar, multiecho pulse sequences of the brain and surrounding structures were obtained without intravenous contrast. COMPARISON:  12/16/2021 FINDINGS: Evaluation is somewhat limited by motion artifact. Brain: Increased cortical signal on diffusion-weighted imaging in the left frontoparietal region without definite ADC correlate (series 5, image 85), which correlates with increased T2 signal (series 11, image 17), new compared  to 12/16/2021. No acute hemorrhage, mass, mass effect, or midline shift. No hydrocephalus or extra-axial collection. Scattered T2 hyperintense signal in the periventricular white matter, likely the sequela of mild-to-moderate chronic small vessel ischemic disease. The hippocampi are symmetric in size and signal. No heterotopia or evidence of cortical dysgenesis. Vascular: Normal arterial flow voids. Skull and upper cervical spine: Normal marrow signal. Sinuses/Orbits: No acute finding.  Other: Fluid in the bilateral mastoid air cells. IMPRESSION: 1. Increased cortical signal on diffusion-weighted sequence and T2 weighted sequences in the left frontoparietal region, new compared to 12/16/2021, favored to represent sequela of a recent seizure, given the distribution. 2. No other acute intracranial process. These results will be called to the ordering clinician or representative by the Radiologist Assistant, and communication documented in the PACS or [employer]. Electronically Signed   By: Merilyn Baba M.D.   On: 12/23/2021 20:54    Labs: BMET Recent Labs  Lab 12/18/21 1428 12/18/21 1645 12/19/21 0339 12/19/21 1835 12/20/21 0508 12/21/21 0210 12/21/21 0512 12/22/21 0314 12/23/21 0039 12/24/21 1231 12/25/21 0327  NA 136   < > 132*  --  132* 123* 124* 124* 124* 129* 129*  K 3.8   < > 3.6  --  3.4* 4.2 4.3 5.0 5.1 4.3 3.6  CL 95*  --  98  --  89* 85* 87* 85* 87* 90* 90*  CO2 28  --  24  --  25 24 25 23  21* 24 23  GLUCOSE 148*  --  111*  --  196* 188* 226* 106* 118* 184* 160*  BUN 15  --  20  --  15 26* 27* 45* 58* 52* 35*  CREATININE 2.22*  --  2.50*  --  1.80* 2.39* 2.46* 2.96* 3.48* 2.84* 1.85*  CALCIUM 8.8*  --  8.4*  --  8.7* 8.4* 8.6* 8.8* 8.7* 8.8* 8.4*  PHOS 3.0  --  3.3 1.8* 2.1*  --   --   --  3.4 3.5 2.6   < > = values in this interval not displayed.   CBC Recent Labs  Lab 12/22/21 0314 12/23/21 0039 12/24/21 1231 12/25/21 0355  WBC 9.1 9.0 13.5* 18.6*  NEUTROABS  --   --   --  15.6*  HGB 8.9* 8.4* 8.4* 7.5*  HCT 27.5* 25.8* 26.7* 23.4*  MCV 97.9 95.9 99.6 99.2  PLT 127* 135* 147* 140*    Medications:     Chlorhexidine Gluconate Cloth  6 each Topical Daily   famotidine  10 mg Per Tube Daily   folic acid  1 mg Per Tube Q1200   heparin injection (subcutaneous)  5,000 Units Subcutaneous Q12H   insulin aspart  0-9 Units Subcutaneous Q4H   multivitamin  1 tablet Per Tube QHS   mouth rinse  15 mL Mouth Rinse Q2H   pantoprazole (PROTONIX) IV   40 mg Intravenous QHS   phenytoin (DILANTIN) IV  100 mg Intravenous Q8H   polyethylene glycol  17 g Per Tube Daily   sodium chloride flush  10-40 mL Intracatheter Q12H   vitamin E  400 Units Per Tube Daily

## 2021-12-25 NOTE — Progress Notes (Signed)
Sputum sample unable to be obtained at this time. Sputum trap placed inline and sample will be sent once obtained.

## 2021-12-25 NOTE — Progress Notes (Signed)
Nutrition Follow-up  DOCUMENTATION CODES:   Severe malnutrition in context of chronic illness  INTERVENTION:   Continue tube feeds via Cortrak: - Vital AF 1.2 @ 55 ml/hr (1320 ml/day)  Tube feeding regimen provides 1584 kcal, 99 grams of protein, and 1071 ml of H2O.   - Continue renal MVI daily per tube  - Vitamin E solution 400 units daily per tube x 14 days for vitamin E deficiency; recommend recheck vitamin E lab after 14 days  - Awaiting results of zinc and vitamin K labs  NUTRITION DIAGNOSIS:   Severe Malnutrition related to chronic illness (ESRD, chronic pancreatitis, CHF) as evidenced by severe fat depletion, severe muscle depletion.  Ongoing, being addressed via TF  GOAL:   Patient will meet greater than or equal to 90% of their needs  Met via TF  MONITOR:   Vent status, Labs, Weight trends, TF tolerance, Skin, I & O's  REASON FOR ASSESSMENT:   Consult Enteral/tube feeding initiation and management  ASSESSMENT:   66 year old female who presented to the ED from SNF on 10/30 as a Code Stroke. PMH of ESRD on HD, gastroparesis, chronic pancreatitis, T2DM, CHF, HTN, HLD, neuropathy, seizures. Pt received TNK.  11/01 - Cortrak placed (tip gastric) 11/02 - TF changed to Vital AF 1.2, intubated for burst suppression  Discussed pt with RN. Last iHD was on 12/24/21 with 1100 ml net UF. Next iHD planned for tomorrow. Tube feeds infusing at goal rate via Cortrak and pt tolerating well. Per PCCM note, pt starting to wake up and getting closer to extubation.  Pt with mild pitting generalized edema, deep pitting edema to BUE, and mild to moderate pitting edema to BLE.  Admit weight: 49.4 kg Current weight: 52.3 kg EDW: 41 kg  Patient remains intubated on ventilator support MV: 7.2 L/min Temp (24hrs), Avg:99.8 F (37.7 C), Min:98.5 F (36.9 C), Max:102.9 F (39.4 C)  Medications reviewed and include: pepcid, folic acid, SSI q 4 hours, rena-vit, IV protonix, IV  dilantin, miralax, vitamin E solution 400 units daily, IV abx  Vitamin/Mineral Profile: Thiamine B1: 131.2 (WNL) Vitamin A: 29.1 (WNL) Vitamin D: 37.28 (WNL) Vitamin E (alpha tocopherol): 7.2 (low) Vitamin K: pending Vitamin C: 1.6 (WNL) Zinc: pending CRP: 11.5 (high)  Labs reviewed: sodium 129, WBC 18.6, hemoglobin 7.5, platelets 140 CBG's: 135-211 x 24 hours  I/O's: +8.4 L since admit  Diet Order:   Diet Order             Diet NPO time specified  Diet effective now                   EDUCATION NEEDS:   Not appropriate for education at this time  Skin:  Skin Assessment: Skin Integrity Issues: DTI: sacrum  Last BM:  12/24/21 medium type 7  Height:   Ht Readings from Last 1 Encounters:  12/18/21 _0  (1.422 m)    Weight:   Wt Readings from Last 1 Encounters:  12/24/21 52.3 kg    BMI:  Body mass index is 25.85 kg/m.  Estimated Nutritional Needs:   Kcal:  1500-1700  Protein:  70-85 grams  Fluid:  >1.5 L    Gustavus Bryant, MS, RD, LDN Inpatient Clinical Dietitian Please see AMiON for contact information.

## 2021-12-25 NOTE — Progress Notes (Signed)
Sputum sample obtained and sent to lab.  

## 2021-12-26 DIAGNOSIS — R569 Unspecified convulsions: Secondary | ICD-10-CM | POA: Diagnosis not present

## 2021-12-26 DIAGNOSIS — R4182 Altered mental status, unspecified: Secondary | ICD-10-CM | POA: Diagnosis not present

## 2021-12-26 LAB — CBC
HCT: 20.7 % — ABNORMAL LOW (ref 36.0–46.0)
Hemoglobin: 6.7 g/dL — CL (ref 12.0–15.0)
MCH: 32.1 pg (ref 26.0–34.0)
MCHC: 32.4 g/dL (ref 30.0–36.0)
MCV: 99 fL (ref 80.0–100.0)
Platelets: 174 10*3/uL (ref 150–400)
RBC: 2.09 MIL/uL — ABNORMAL LOW (ref 3.87–5.11)
RDW: 14.8 % (ref 11.5–15.5)
WBC: 15.3 10*3/uL — ABNORMAL HIGH (ref 4.0–10.5)
nRBC: 0 % (ref 0.0–0.2)

## 2021-12-26 LAB — RENAL FUNCTION PANEL
Albumin: 1.9 g/dL — ABNORMAL LOW (ref 3.5–5.0)
Anion gap: 14 (ref 5–15)
BUN: 59 mg/dL — ABNORMAL HIGH (ref 8–23)
CO2: 22 mmol/L (ref 22–32)
Calcium: 8.6 mg/dL — ABNORMAL LOW (ref 8.9–10.3)
Chloride: 94 mmol/L — ABNORMAL LOW (ref 98–111)
Creatinine, Ser: 2.73 mg/dL — ABNORMAL HIGH (ref 0.44–1.00)
GFR, Estimated: 19 mL/min — ABNORMAL LOW (ref >60)
Glucose, Bld: 154 mg/dL — ABNORMAL HIGH (ref 70–99)
Phosphorus: 3.9 mg/dL (ref 2.5–4.6)
Potassium: 4.2 mmol/L (ref 3.5–5.1)
Sodium: 130 mmol/L — ABNORMAL LOW (ref 135–145)

## 2021-12-26 LAB — GLUCOSE, CAPILLARY
Glucose-Capillary: 135 mg/dL — ABNORMAL HIGH (ref 70–99)
Glucose-Capillary: 142 mg/dL — ABNORMAL HIGH (ref 70–99)
Glucose-Capillary: 145 mg/dL — ABNORMAL HIGH (ref 70–99)
Glucose-Capillary: 150 mg/dL — ABNORMAL HIGH (ref 70–99)
Glucose-Capillary: 168 mg/dL — ABNORMAL HIGH (ref 70–99)
Glucose-Capillary: 262 mg/dL — ABNORMAL HIGH (ref 70–99)
Glucose-Capillary: 98 mg/dL (ref 70–99)

## 2021-12-26 LAB — PREPARE RBC (CROSSMATCH)

## 2021-12-26 LAB — VITAMIN K1, SERUM: VITAMIN K1: <0.1 ng/mL — ABNORMAL LOW (ref 0.10–2.20)

## 2021-12-26 MED ORDER — POLYETHYLENE GLYCOL 3350 17 G PO PACK: 17.0000 g | PACK | Freq: Every day | ORAL | Status: DC | PRN

## 2021-12-26 MED ORDER — SODIUM CHLORIDE 0.9% IV SOLUTION: Freq: Once | INTRAVENOUS | Status: DC

## 2021-12-26 NOTE — Progress Notes (Signed)
[facility] Progress Note   Assessment/ Plan:   Assessment/Plan: 66 year old BF with multiple medical issues including ESRD-  now admitted with code stroke s/p TNK  1 new CVA- with lateral gaze and right sided weakness-  s/p TNK per neurology.  EEG with nonconvulsive status, per neuro 2 ESRD: normally TTS at [facility]'; lt upper arm loop with bypass graft in arterial limb placed on 11/04/2021. - HD 10/30, 11/1, will do off schedule until can get back on schedule.    Removing the rt fem temp given fevers and we can try to utilize the AVG again along the medial aspect. No luck accessing the lateral bypass graft on 11/8 (only ran for 1hr 20 min.  Plan on HD today; I remarked the AVG. Arterial needle in the arterial limb of the graft which is lateral (needle pointed up - cephalad) with venous needle in the venous limb (medial in arm) with needle also pointed up (cephalad). "X" is the cannulation site, arrow is direction of needle.  4. Anemia of ESRD: hgb actually looks good -  anticipate it will go down with time  5. Metabolic Bone Disease: continue calcitriol , resume auryxia once eating  6.  Bleeding AVG -  as above (may have outflow VAS) 7. Hyperkalemia-  resolved 8.  Nonconvulsive status epilepticus: intubated, on propofol for burst suppression 9.  Dispo: needs to get pall care c/s    Subjective:     Prolonged bleeding after needles pulled post hd tx through LUA AVG previously but that was from cannulation of medial venous limb. Infiltration higher in arm but no e/o active bleed. Fever of 101, (rt fem cath rem 11/9)  Much more alert today.   Objective:   BP (!) 157/71   Pulse 90   Temp 98.5 F (36.9 C) (Oral)   Resp 20   Ht 4\' 8"  (1.422 m)   Wt 52.2 kg   SpO2 100%   BMI 25.80 kg/m   Intake/Output Summary (Last 24 hours) at 12/26/2021 7062 Last data filed at 12/26/2021 0600 Gross per 24 hour  Intake 1389.97 ml  Output --  Net 1389.97 ml   Weight change:    Physical Exam: Gen:NAD, intubated, much more alert today CVS: RRR Resp: clear Abd: soft Ext: R sided weakness Neuro: intubated  ACCESS: Lt loop AVG (arterial limb is lateral) + B, no further bleeding.  + two blisters/ skin tears on medial aspect (venous limb), infiltration higher in arm but no active bleed, marked cannulation sites again today  Imaging: DG CHEST PORT 1 VIEW  Result Date: 12/25/2021 CLINICAL DATA:  Respiratory failure EXAM: PORTABLE CHEST 1 VIEW COMPARISON:  Previous studies including the examination of 12/18/2021 FINDINGS: Transverse diameter of heart is slightly increased. There are no signs of pulmonary edema or new focal infiltrates. There is no pleural effusion or pneumothorax. Tip of endotracheal tube is 3.6 cm above the carina. Distal portion of enteric tube is seen in the stomach. IMPRESSION: There are no new infiltrates or signs of pulmonary edema. Electronically Signed   By: Elmer Picker M.D.   On: 12/25/2021 09:30    Labs: BMET Recent Labs  Lab 12/19/21 1835 12/20/21 0508 12/21/21 0210 12/21/21 0512 12/22/21 0314 12/23/21 0039 12/24/21 1231 12/25/21 0327  NA  --  132* 123* 124* 124* 124* 129* 129*  K  --  3.4* 4.2 4.3 5.0 5.1 4.3 3.6  CL  --  89* 85* 87* 85* 87* 90* 90*  CO2  --  25 24 25 23  21* 24 23  GLUCOSE  --  196* 188* 226* 106* 118* 184* 160*  BUN  --  15 26* 27* 45* 58* 52* 35*  CREATININE  --  1.80* 2.39* 2.46* 2.96* 3.48* 2.84* 1.85*  CALCIUM  --  8.7* 8.4* 8.6* 8.8* 8.7* 8.8* 8.4*  PHOS 1.8* 2.1*  --   --   --  3.4 3.5 2.6   CBC Recent Labs  Lab 12/22/21 0314 12/23/21 0039 12/24/21 1231 12/25/21 0355  WBC 9.1 9.0 13.5* 18.6*  NEUTROABS  --   --   --  15.6*  HGB 8.9* 8.4* 8.4* 7.5*  HCT 27.5* 25.8* 26.7* 23.4*  MCV 97.9 95.9 99.6 99.2  PLT 127* 135* 147* 140*    Medications:     Chlorhexidine Gluconate Cloth  6 each Topical Daily   famotidine  10 mg Per Tube Daily   folic acid  1 mg Per Tube Q1200   heparin  injection (subcutaneous)  5,000 Units Subcutaneous Q12H   insulin aspart  0-9 Units Subcutaneous Q4H   multivitamin  1 tablet Per Tube QHS   mouth rinse  15 mL Mouth Rinse Q2H   pantoprazole (PROTONIX) IV  40 mg Intravenous QHS   phenytoin (DILANTIN) IV  100 mg Intravenous Q8H   polyethylene glycol  17 g Per Tube Daily   sodium chloride flush  10-40 mL Intracatheter Q12H   vitamin E  400 Units Per Tube Daily

## 2021-12-26 NOTE — Progress Notes (Incomplete)
Talked extensively with patient's son [name]

## 2021-12-26 NOTE — Progress Notes (Addendum)
2nd assess by vast team RN. Attempted 1 1US guided PIV. No other suitable vasculature seen with [place]. Pt has fistula in left arm. Nada Boozer, RN at bedside is aware of poor vasculature and that pt has been assessed by 2 vast team RN and will notify provider of pt limited access. Will need to assess for central line access

## 2021-12-26 NOTE — Progress Notes (Signed)
NAME:  Erica [name], MRN:  [id number], DOB:  [birth year], LOS: 86 ADMISSION DATE:  12/15/2021, CONSULTATION DATE:  12/19/2021 REFERRING MD:   Ova Freshwater - EDP CHIEF COMPLAINT:  Altered Mental Status    History of Present Illness:  66 year old woman who presented to [place] from [facility] for scheduled EGD; this was cancelled as chewing tobacco was found in patient's mouth at the time of assessment. PMHx significant for HTN, HLD, CHF, seizure disorder (on Keppra), T2DM with peripheral neuropathy, ESRD on HD (TTS via LUE AVF).  Shortly after this, patient had L-sided gaze deviation with concomitant R-sided weakness and concern for stroke. Code Stroke was called. CT Head NAICA, TNK was administered at 1349. CTA Head/Neck negative for LVO. While in ED, post-TNK administration patient's LUE AVF dialysis access began to bleed. Pressure was applied and clamps placed. Patient was transferred to [facility] for higher level of care.    PCCM ground team called to ED to reassess patient after [facility] ED arrival. On arrival, patient reportedly had expanding hematoma at LUE AVF access site. [facility] ED RN applied circumferential pressure which allowed for hemostasis. Dr. Virl Cagey (VVS) was present at bedside; fortunately no urgent surgical intervention was needed. Ace bandage x 2 were applied for compression and the access site remained hemostatic. Due to recent TNK administration and active bleeding, temporary dialysis access was recommended (discussed with son, [name], and consent obtained via phone - please see procedure note dated 10/31AM).  Pertinent Medical History:  ESRD on HD, left arm AV fistula Hypertension Seizure disorder, on Keppra Diabetes type 2, on insulin  Significant Hospital Events: Including procedures, antibiotic start and stop dates in addition to other pertinent events   10/30 - CTA Head/Neck 1. No emergent large vessel occlusion or proximal hemodynamically significant stenosis. 2. Normal CT  perfusion. 10/31 - R fem HD cath, EEG suggestive of moderate to severe diffuse encephalopathy, nonspecific etiology. No seizures or epileptiform discharges were seen throughout the recording. MRI Brain no evidence for acute stroke, left cortical ribbon, question consistent with seizure 11/4 - No seizures overnight. Plan to wean off propofol today and continue home AEDs.  11/5 - No seizures overnight. Hyponatremic (124) on morning BMP. 11/6 - Failed PSV/wean due to low Vt. Remains off of sedation since 11/4, minimally responsive. LTM EEG ongoing. 11/7 - Tolerating PSV 10/5. LTM EEG off.  MRI showed increased cortical signal consistent with recent seizure 11/7 MRI-IMPRESSION:1. Increased cortical signal on diffusion-weighted sequence and T2 weighted sequences in the left frontoparietal region, new compared to 12/16/2021, favored to represent sequela of a recent seizure, given the distribution.    Interim History / Subjective:   Remains critically ill, intubated Attempts to arouse but not really opening eyes, Not following commands Tmax 101.6  Objective:  Blood pressure (!) 157/71, pulse 90, temperature 99.1 F (37.3 C), temperature source Axillary, resp. rate 20, height 4\' 8"  (1.422 m), weight 52.2 kg, SpO2 100 %.    Vent Mode: PRVC FiO2 (%):  [30 %] 30 % Set Rate:  [15 bmp] 15 bmp Vt Set:  [340 mL] 340 mL PEEP:  [5 cmH20] 5 cmH20 Plateau Pressure:  [16 cmH20-24 cmH20] 24 cmH20   Intake/Output Summary (Last 24 hours) at 12/26/2021 0811 Last data filed at 12/26/2021 0600 Gross per 24 hour  Intake 1389.97 ml  Output --  Net 1389.97 ml   Filed Weights   12/23/21 0510 12/24/21 0400 12/26/21 0500  Weight: 47.7 kg 52.3 kg 52.2 kg   Physical Examination:  General: Chronically ill-appearing, HEENT: Moist oral mucosa, endotracheal tube in place Neuro: Appears sedated, does appear to arouse but not opening eyes to command CV: R S1-S2 appreciated with systolic murmur maximal left upper  sternal border PULM: Clear to auscultation bilaterally GI: Bowel sounds appreciated Extremities: Bilateral upper extremity edema Skin: Warm/dry, no rashes.  Labs: Leukocytosis, anemia  Resolved Hospital Problem List:     Assessment & Plan:   Nonconvulsive status epilepticus Metabolic encephalopathy History of epilepsy/seizure disorder -Neurology continues to follow -Antiepileptics per neurology-this includes Keppra/Vimpat/phenytoin -Ativan for seizures -Not on continuous sedatives  Acute hypoxic respiratory failure -Spontaneous breathing trials -Once mental status allows, will plan for extubation  End-stage renal disease on hemodialysis -Appreciate nephrology follow-up -She has a left upper extremity access -Femoral HD catheter removed  Fever and leukocytosis -On antibiotics -Day 2 of vancomycin and Zosyn  Type 2 diabetes Hypoglycemia -Monitor blood sugar every 4 -Goal blood sugar of 140-118  Severe malnutrition in the setting of chronic illness -Tube feed at goal  Tracheal aspirate not showing any organisms Blood cultures 12/25/2021-no result so far  Best Practice: (right click and "Reselect all SmartList Selections" daily)   Diet/type: tubefeeds DVT prophylaxis: SCD GI prophylaxis: H2B Lines: Dialysis Catheter and yes and it is still needed Foley:  N/A Code Status:  full code Last date of multidisciplinary goals of care discussion: Son updated via phone 11/7.  PMT following  The patient is critically ill with multiple organ systems failure and requires high complexity decision making for assessment and support, frequent evaluation and titration of therapies, application of advanced monitoring technologies and extensive interpretation of multiple databases. Critical Care Time devoted to patient care services described in this note independent of APP/resident time (if applicable)  is 31 minutes.   Sherrilyn Rist MD [facility] Personal  pager: See Amion If unanswered, please page CCM On-call: [phone]

## 2021-12-26 NOTE — Progress Notes (Signed)
Received patient in bed. Alert but intubated Informed consent signed and in chart.   Treatment initiated: 1400 Treatment completed: 4035  Patient tolerated well.  Alert, without acute distress.  Hand-off given to patient's nurse.   Access used: LUA AVG Access issues: none  Total UF removed: 3L Medication(s) given: none    [facility]

## 2021-12-26 NOTE — Progress Notes (Signed)
Subjective: Awake, following commands no.  Patient's niece at bedside.  ROS: Unable to obtain due to poor mental status  Examination  Vital signs in last 24 hours: Temp:  [98.5 F (36.9 C)-101.6 F (38.7 C)] 98.5 F (36.9 C) (11/10 0800) Pulse Rate:  [84-100] 85 (11/10 1100) Resp:  [17-27] 21 (11/10 1100) BP: (147-187)/(59-147) 147/72 (11/10 1100) SpO2:  [98 %-100 %] 100 % (11/10 1100) FiO2 (%):  [30 %] 30 % (11/10 0400) Weight:  [52.2 kg] 52.2 kg (11/10 0500)  General: lying in bed, intubated Neuro: Awake, alert, following simple one-step commands, PERRLA, tracking examiner in room, antigravity strength in right upper and right lower extremity, does appear to move left lower extremity and left upper extremity to noxious stimuli  Basic Metabolic Panel: Recent Labs  Lab 12/19/21 1835 12/20/21 0508 12/21/21 0210 12/21/21 0512 12/22/21 0314 12/23/21 0039 12/24/21 1231 12/25/21 0327  NA  --  132*   < > 124* 124* 124* 129* 129*  K  --  3.4*   < > 4.3 5.0 5.1 4.3 3.6  CL  --  89*   < > 87* 85* 87* 90* 90*  CO2  --  25   < > 25 23 21* 24 23  GLUCOSE  --  196*   < > 226* 106* 118* 184* 160*  BUN  --  15   < > 27* 45* 58* 52* 35*  CREATININE  --  1.80*   < > 2.46* 2.96* 3.48* 2.84* 1.85*  CALCIUM  --  8.7*   < > 8.6* 8.8* 8.7* 8.8* 8.4*  MG 1.7 1.7  --   --   --  1.8  --  1.7  PHOS 1.8* 2.1*  --   --   --  3.4 3.5 2.6   < > = values in this interval not displayed.    CBC: Recent Labs  Lab 12/21/21 0210 12/22/21 0314 12/23/21 0039 12/24/21 1231 12/25/21 0355  WBC 13.7* 9.1 9.0 13.5* 18.6*  NEUTROABS  --   --   --   --  15.6*  HGB 9.5* 8.9* 8.4* 8.4* 7.5*  HCT 30.2* 27.5* 25.8* 26.7* 23.4*  MCV 100.3* 97.9 95.9 99.6 99.2  PLT 158 127* 135* 147* 140*    Coagulation Studies: No results for input(s): "LABPROT", "INR" in the last 72 hours.  Imaging No new brain imaging overnight   ASSESSMENT AND PLAN:  66 year old female with history of epilepsy who presented with  breakthrough seizures in the setting of hyponatremia (sodium 119).  MRI brain showed restricted diffusion/cortical reddening and left parieto-occipital region   Focal electrographic/nonconvulsive status epilepticus, refractory, resolved Epilepsy with breakthrough seizure -Etiology: Hyponatremia with sodium 119.  No evidence of infection (no leukocytosis, afebrile) - Its unclear what patient's AED dosing is. She was last seen by our team in 2020 and at that time was discharged on Dilantin 150 mg twice daily, Topamax 25 mg twice daily and Keppra 1000mg  daily with additional 500 mg postdialysis.  However during current admission it appears that patient was only on Keppra 500 mg daily -MRI changes are likely due to prolonged status epilepticus.   Recommendations -Continue Keppra 500 mg daily with additional dose of 250 mg after dialysis -Continue Vimpat 150 mg twice daily, phenytoin 100 mg 3 times daily -Continue phenytoin 100 mg every 8 hours  -Continue seizure precautions -As needed IV Ativan 2 mg for clinical seizure-like activity -Updated niece at bedside -Neurology will sign off.  Please call us for  any further questions.   I have spent a total of 26 minutes with the patient reviewing hospital notes,  test results, labs and examining the patient as well as establishing an assessment and plan.  > 50% of time was spent in direct patient care.    Zeb Comfort [facility] For questions after 5pm please refer to AMION to reach the Neurologist on call

## 2021-12-27 DIAGNOSIS — R4182 Altered mental status, unspecified: Secondary | ICD-10-CM | POA: Diagnosis not present

## 2021-12-27 DIAGNOSIS — Z515 Encounter for palliative care: Secondary | ICD-10-CM | POA: Diagnosis not present

## 2021-12-27 DIAGNOSIS — G40901 Epilepsy, unspecified, not intractable, with status epilepticus: Secondary | ICD-10-CM | POA: Diagnosis not present

## 2021-12-27 DIAGNOSIS — N186 End stage renal disease: Secondary | ICD-10-CM | POA: Diagnosis not present

## 2021-12-27 LAB — COMPREHENSIVE METABOLIC PANEL
ALT: 82 U/L — ABNORMAL HIGH (ref 0–44)
AST: 84 U/L — ABNORMAL HIGH (ref 15–41)
Albumin: 2.1 g/dL — ABNORMAL LOW (ref 3.5–5.0)
Alkaline Phosphatase: 478 U/L — ABNORMAL HIGH (ref 38–126)
Anion gap: 13 (ref 5–15)
BUN: 32 mg/dL — ABNORMAL HIGH (ref 8–23)
CO2: 25 mmol/L (ref 22–32)
Calcium: 8.6 mg/dL — ABNORMAL LOW (ref 8.9–10.3)
Chloride: 94 mmol/L — ABNORMAL LOW (ref 98–111)
Creatinine, Ser: 1.69 mg/dL — ABNORMAL HIGH (ref 0.44–1.00)
GFR, Estimated: 33 mL/min — ABNORMAL LOW (ref >60)
Glucose, Bld: 193 mg/dL — ABNORMAL HIGH (ref 70–99)
Potassium: 4 mmol/L (ref 3.5–5.1)
Sodium: 132 mmol/L — ABNORMAL LOW (ref 135–145)
Total Bilirubin: 2.8 mg/dL — ABNORMAL HIGH (ref 0.3–1.2)
Total Protein: 5.9 g/dL — ABNORMAL LOW (ref 6.5–8.1)

## 2021-12-27 LAB — CBC WITH DIFFERENTIAL/PLATELET
Abs Immature Granulocytes: 0.06 10*3/uL (ref 0.00–0.07)
Basophils Absolute: 0 10*3/uL (ref 0.0–0.1)
Basophils Relative: 0 %
Eosinophils Absolute: 0.2 10*3/uL (ref 0.0–0.5)
Eosinophils Relative: 1 %
HCT: 26.4 % — ABNORMAL LOW (ref 36.0–46.0)
Hemoglobin: 8.4 g/dL — ABNORMAL LOW (ref 12.0–15.0)
Immature Granulocytes: 0 %
Lymphocytes Relative: 6 %
Lymphs Abs: 1 10*3/uL (ref 0.7–4.0)
MCH: 30.3 pg (ref 26.0–34.0)
MCHC: 31.8 g/dL (ref 30.0–36.0)
MCV: 95.3 fL (ref 80.0–100.0)
Monocytes Absolute: 1 10*3/uL (ref 0.1–1.0)
Monocytes Relative: 7 %
Neutro Abs: 12.8 10*3/uL — ABNORMAL HIGH (ref 1.7–7.7)
Neutrophils Relative %: 86 %
Platelets: 192 10*3/uL (ref 150–400)
RBC: 2.77 MIL/uL — ABNORMAL LOW (ref 3.87–5.11)
RDW: 17.5 % — ABNORMAL HIGH (ref 11.5–15.5)
WBC: 15.1 10*3/uL — ABNORMAL HIGH (ref 4.0–10.5)
nRBC: 0.1 % (ref 0.0–0.2)

## 2021-12-27 LAB — TYPE AND SCREEN
ABO/RH(D): B POS
Antibody Screen: NEGATIVE
Unit division: 0

## 2021-12-27 LAB — IRON AND TIBC
Iron: 66 ug/dL (ref 28–170)
Saturation Ratios: 38 % — ABNORMAL HIGH (ref 10.4–31.8)
TIBC: 172 ug/dL — ABNORMAL LOW (ref 250–450)
UIBC: 106 ug/dL

## 2021-12-27 LAB — BPAM RBC
Blood Product Expiration Date: 202311242359
ISSUE DATE / TIME: 202311102051
Unit Type and Rh: 7300

## 2021-12-27 LAB — GLUCOSE, CAPILLARY
Glucose-Capillary: 146 mg/dL — ABNORMAL HIGH (ref 70–99)
Glucose-Capillary: 86 mg/dL (ref 70–99)
Glucose-Capillary: 136 mg/dL — ABNORMAL HIGH (ref 70–99)
Glucose-Capillary: 201 mg/dL — ABNORMAL HIGH (ref 70–99)
Glucose-Capillary: 163 mg/dL — ABNORMAL HIGH (ref 70–99)

## 2021-12-27 LAB — FERRITIN: Ferritin: 3059 ng/mL — ABNORMAL HIGH (ref 11–307)

## 2021-12-27 MED ORDER — ORAL CARE MOUTH RINSE
15.0000 mL | OROMUCOSAL | Status: DC
  Administered 2021-12-27 – 2021-12-31 (×16): 15 mL via OROMUCOSAL

## 2021-12-27 MED ORDER — LEVETIRACETAM 100 MG/ML PO SOLN
250.0000 mg | ORAL | Status: DC | PRN
  Administered 2021-12-30: 250 mg
  Filled 2021-12-27: qty 5

## 2021-12-27 MED ORDER — ORAL CARE MOUTH RINSE: 15.0000 mL | OROMUCOSAL | Status: DC | PRN

## 2021-12-27 MED ORDER — AMOXICILLIN-POT CLAVULANATE 400-57 MG/5ML PO SUSR
500.0000 mg | Freq: Two times a day (BID) | ORAL | Status: DC
  Administered 2021-12-28 – 2021-12-31 (×8): 500 mg
  Filled 2021-12-27 (×9): qty 6.3

## 2021-12-27 MED ORDER — LACOSAMIDE 10 MG/ML PO SOLN
150.0000 mg | Freq: Two times a day (BID) | ORAL | Status: DC
  Administered 2021-12-28 – 2021-12-31 (×8): 150 mg
  Filled 2021-12-27 (×8): qty 15

## 2021-12-27 MED ORDER — LEVETIRACETAM 100 MG/ML PO SOLN
500.0000 mg | Freq: Two times a day (BID) | ORAL | Status: DC
  Administered 2021-12-28 – 2021-12-31 (×8): 500 mg
  Filled 2021-12-27 (×8): qty 5

## 2021-12-27 MED ORDER — PHENYTOIN 125 MG/5ML PO SUSP
100.0000 mg | Freq: Three times a day (TID) | ORAL | Status: DC
  Administered 2021-12-28 – 2021-12-31 (×12): 100 mg
  Filled 2021-12-27 (×14): qty 4

## 2021-12-27 MED ORDER — DARBEPOETIN ALFA 60 MCG/0.3ML IJ SOSY
60.0000 ug | PREFILLED_SYRINGE | Freq: Once | INTRAMUSCULAR | Status: AC
  Administered 2021-12-27: 60 ug via SUBCUTANEOUS
  Filled 2021-12-27: qty 0.3

## 2021-12-27 MED ORDER — LACOSAMIDE 10 MG/ML PO SOLN
50.0000 mg | ORAL | Status: DC | PRN
  Administered 2021-12-30: 50 mg
  Filled 2021-12-27 (×2): qty 5

## 2021-12-27 NOTE — Progress Notes (Signed)
[facility] Progress Note   Davita [facility] 3hr 45 in EDW 45kg 400/500 2/3 bath Calcitriol 0.75 PO TIW Heparin 1000 loading -> 500/hr Mircera 60 q2weeks   Assessment/ Plan:   Assessment/Plan: 66 year old BF with multiple medical issues including ESRD-  now admitted with code stroke s/p TNK  1 new CVA- with lateral gaze and right sided weakness-  s/p TNK per neurology.  EEG with nonconvulsive status, per neuro 2 ESRD: normally TTS at [facility]'; lt upper arm loop with bypass graft in arterial limb placed on 11/04/2021.    Removed the rt fem temp on 11/9 given fevers and we were fortunately able to utilize the AVG again. Arterial limb access on 11/10 successfully.   Able to tolerate a 3h69min treatment on 11/10 with hemostasis within 10 minutes. 3L net UF. Next HD on Monday. MWF while in the hospital and will transition as she's closer to d/c.  4. Anemia of ESRD: hgb actually looks ok (8.4)  -  anticipate it will go down with time. Will check iron panel and also start Mircera 60 to hopefully avoid a blood transfusion 5. Metabolic Bone Disease: continue calcitriol , resume auryxia once eating  6.  Bleeding AVG -  as above (may have outflow VAS) 7. Hyperkalemia-  resolved 8.  Nonconvulsive status epilepticus: intubated, on propofol for burst suppression 9.  Dispo: needs to get pall care c/s    Subjective:     Prolonged bleeding after needles pulled post hd tx through LUA AVG previously but that was from cannulation of medial venous limb but did ok on 11/10. Infiltration higher in arm but no e/o active bleed. Fever of 101 -> rt fem cath rem 11/9.     Objective:   BP (!) 161/84   Pulse 94   Temp 99.1 F (37.3 C) (Axillary)   Resp (!) 25   Ht 4\' 8"  (1.422 m)   Wt 52.9 kg   SpO2 100%   BMI 26.15 kg/m   Intake/Output Summary (Last 24 hours) at 12/27/2021 0800 Last data filed at 12/27/2021 0600 Gross per 24 hour  Intake 2518.02 ml  Output 3200 ml  Net -681.98 ml    Weight change: 0 kg  Physical Exam: Gen:NAD, intubated, arousable and opening eyes CVS: RRR Resp: clear Abd: soft Ext: R sided weakness Neuro: intubated  ACCESS: Lt loop AVG (arterial limb is lateral) + B, no further bleeding.  + two blisters/ skin tears on medial aspect (venous limb), infiltration higher in arm but no active bleed  Imaging: DG CHEST PORT 1 VIEW  Result Date: 12/25/2021 CLINICAL DATA:  Respiratory failure EXAM: PORTABLE CHEST 1 VIEW COMPARISON:  Previous studies including the examination of 12/18/2021 FINDINGS: Transverse diameter of heart is slightly increased. There are no signs of pulmonary edema or new focal infiltrates. There is no pleural effusion or pneumothorax. Tip of endotracheal tube is 3.6 cm above the carina. Distal portion of enteric tube is seen in the stomach. IMPRESSION: There are no new infiltrates or signs of pulmonary edema. Electronically Signed   By: Elmer Picker M.D.   On: 12/25/2021 09:30    Labs: BMET Recent Labs  Lab 12/21/21 0512 12/22/21 0314 12/23/21 0039 12/24/21 1231 12/25/21 0327 12/26/21 1435 12/27/21 0212  NA 124* 124* 124* 129* 129* 130* 132*  K 4.3 5.0 5.1 4.3 3.6 4.2 4.0  CL 87* 85* 87* 90* 90* 94* 94*  CO2 25 23 21* 24 23 22 25   GLUCOSE 226* 106*  118* 184* 160* 154* 193*  BUN 27* 45* 58* 52* 35* 59* 32*  CREATININE 2.46* 2.96* 3.48* 2.84* 1.85* 2.73* 1.69*  CALCIUM 8.6* 8.8* 8.7* 8.8* 8.4* 8.6* 8.6*  PHOS  --   --  3.4 3.5 2.6 3.9  --    CBC Recent Labs  Lab 12/24/21 1231 12/25/21 0355 12/26/21 1435 12/27/21 0212  WBC 13.5* 18.6* 15.3* 15.1*  NEUTROABS  --  15.6*  --  12.8*  HGB 8.4* 7.5* 6.7* 8.4*  HCT 26.7* 23.4* 20.7* 26.4*  MCV 99.6 99.2 99.0 95.3  PLT 147* 140* 174 192    Medications:     sodium chloride   Intravenous Once   Chlorhexidine Gluconate Cloth  6 each Topical Daily   famotidine  10 mg Per Tube Daily   folic acid  1 mg Per Tube Q1200   heparin injection (subcutaneous)  5,000  Units Subcutaneous Q12H   insulin aspart  0-9 Units Subcutaneous Q4H   multivitamin  1 tablet Per Tube QHS   mouth rinse  15 mL Mouth Rinse Q2H   pantoprazole (PROTONIX) IV  40 mg Intravenous QHS   phenytoin (DILANTIN) IV  100 mg Intravenous Q8H   sodium chloride flush  10-40 mL Intracatheter Q12H   vitamin E  400 Units Per Tube Daily

## 2021-12-27 NOTE — Progress Notes (Signed)
NAME:  Erica [name], MRN:  [id number], DOB:  [birth year], LOS: 46 ADMISSION DATE:  12/15/2021, CONSULTATION DATE:  12/19/2021 REFERRING MD:   Ova Freshwater - EDP CHIEF COMPLAINT:  Altered Mental Status    History of Present Illness:  66 year old woman who presented to [place] from [facility] for scheduled EGD; this was cancelled as chewing tobacco was found in patient's mouth at the time of assessment. PMHx significant for HTN, HLD, CHF, seizure disorder (on Keppra), T2DM with peripheral neuropathy, ESRD on HD (TTS via LUE AVF).  Shortly after this, patient had L-sided gaze deviation with concomitant R-sided weakness and concern for stroke. Code Stroke was called. CT Head NAICA, TNK was administered at 1349. CTA Head/Neck negative for LVO. While in ED, post-TNK administration patient's LUE AVF dialysis access began to bleed. Pressure was applied and clamps placed. Patient was transferred to [facility] for higher level of care.    PCCM ground team called to ED to reassess patient after [facility] ED arrival. On arrival, patient reportedly had expanding hematoma at LUE AVF access site. [facility] ED RN applied circumferential pressure which allowed for hemostasis. Dr. Virl Cagey (VVS) was present at bedside; fortunately no urgent surgical intervention was needed. Ace bandage x 2 were applied for compression and the access site remained hemostatic. Due to recent TNK administration and active bleeding, temporary dialysis access was recommended (discussed with son, [name], and consent obtained via phone - please see procedure note dated 10/31AM).  Pertinent Medical History:  ESRD on HD, left arm AV fistula Hypertension Seizure disorder, on Keppra Diabetes type 2, on insulin  Significant Hospital Events: Including procedures, antibiotic start and stop dates in addition to other pertinent events   10/30 - CTA Head/Neck 1. No emergent large vessel occlusion or proximal hemodynamically significant stenosis. 2. Normal CT  perfusion. 10/31 - R fem HD cath, EEG suggestive of moderate to severe diffuse encephalopathy, nonspecific etiology. No seizures or epileptiform discharges were seen throughout the recording. MRI Brain no evidence for acute stroke, left cortical ribbon, question consistent with seizure 11/4 - No seizures overnight. Plan to wean off propofol today and continue home AEDs.  11/5 - No seizures overnight. Hyponatremic (124) on morning BMP. 11/6 - Failed PSV/wean due to low Vt. Remains off of sedation since 11/4, minimally responsive. LTM EEG ongoing. 11/7 - Tolerating PSV 10/5. LTM EEG off.  MRI showed increased cortical signal consistent with recent seizure 11/7 MRI-IMPRESSION:1. Increased cortical signal on diffusion-weighted sequence and T2 weighted sequences in the left frontoparietal region, new compared to 12/16/2021, favored to represent sequela of a recent seizure, given the distribution.    Interim History / Subjective:   Remains critically ill, intubated Arouses easily, follows commands Very weak Tmax 102.1  Objective:  Blood pressure (!) 161/84, pulse 94, temperature 99.1 F (37.3 C), temperature source Axillary, resp. rate (!) 25, height 4\' 8"  (1.422 m), weight 52.9 kg, SpO2 100 %.    Vent Mode: PRVC FiO2 (%):  [30 %-50 %] 30 % Set Rate:  [15 bmp] 15 bmp Vt Set:  [340 mL] 340 mL PEEP:  [5 cmH20] 5 cmH20 Pressure Support:  [8 cmH20-10 cmH20] 8 cmH20 Plateau Pressure:  [15 cmH20-16 cmH20] 15 cmH20   Intake/Output Summary (Last 24 hours) at 12/27/2021 0818 Last data filed at 12/27/2021 0600 Gross per 24 hour  Intake 2518.02 ml  Output 3200 ml  Net -681.98 ml   Filed Weights   12/26/21 0500 12/26/21 1424 12/27/21 0417  Weight: 52.2 kg 52.2 kg  52.9 kg   Physical Examination: General: Chronically ill-appearing HEENT: Moist oral mucosa, endotracheal tube in place Neuro: Very weak, follows commands CV: S1-S2 appreciated with systolic murmur maximal at left upper sternal  border PULM: Clear to auscultation GI: Bowel sounds appreciated Extremities: Bilateral upper extremity edema Skin: Warm/dry, no rashes.  Labs: Leukocytosis, anemia  Resolved Hospital Problem List:     Assessment & Plan:   Nonconvulsive status epilepticus Metabolic encephalopathy History of epilepsy/seizure disorder -Antiepileptics including Keppra, Vimpat, phenytoin -Ativan for breakthrough seizures -Patient is not on continuous sedatives  Acute hypoxic respiratory failure -Continue spontaneous breathing trials -She is on minimal vent support and appears that she is weaning well and we will give her chance at extubation today  End-stage renal disease on hemodialysis -Left upper extremity access  Fever and leukocytosis -Day 3 of vancomycin and Zosyn  Anemia secondary to blood loss -Transfuse 1 unit  Type 2 diabetes Hypoglycemia -Continue to monitor blood sugar closely  Severe malnutrition  Tracheal aspirate not showing any organisms Blood cultures 12/25/2021-no result so far  Best Practice: (right click and "Reselect all SmartList Selections" daily)   Diet/type: tubefeeds DVT prophylaxis: SCD GI prophylaxis: H2B Lines: Dialysis Catheter and yes and it is still needed Foley:  N/A Code Status:  full code Last date of multidisciplinary goals of care discussion: Son updated via phone 11/7.  PMT following  The patient is critically ill with multiple organ systems failure and requires high complexity decision making for assessment and support, frequent evaluation and titration of therapies, application of advanced monitoring technologies and extensive interpretation of multiple databases. Critical Care Time devoted to patient care services described in this note independent of APP/resident time (if applicable)  is 31 minutes.   Sherrilyn Rist MD [place] [facility] Personal pager: See Amion If unanswered, please page CCM On-call: [phone]

## 2021-12-27 NOTE — Procedures (Signed)
Extubation Procedure Note  Patient Details:   Name: Erica [name] DOB: 07/14/1955 MRN: [id number]   Airway Documentation:    Vent end date: (not recorded) Vent end time: (not recorded)   Evaluation  O2 sats: stable throughout Complications: No apparent complications Patient did tolerate procedure well. Bilateral Breath Sounds: Diminished, Clear   Yes  Order placed to extubate patient. Patient extubated as charted at 0930 to 3L [place]. No evidence of stridor or respiratory distress noted.    Levin Bacon 12/27/2021, 10:41 AM

## 2021-12-27 NOTE — Progress Notes (Signed)
eLink Physician-Brief Progress Note Patient Name: Erica [name] DOB: Jul 29, 1955 MRN: [id number]   Date of Service  12/27/2021  HPI/Events of Note  Patient is having copious diarrhea.  eICU Interventions  Flexiseal ordered.        Kerry Kass Velora Horstman 12/27/2021, 4:35 AM

## 2021-12-27 NOTE — Progress Notes (Addendum)
eLink Physician-Brief Progress Note Patient Name: Erica [name] DOB: 12/31/1955 MRN: [id number]   Date of Service  12/27/2021  HPI/Events of Note  No IV access. Patient on multiple AEDs and IV antibiotics  Fever curve/WBC  improving on Vanc/Zosyn.  Cultures neg x 48 h  eICU Interventions  Change dilantin, keppra and vimpat to per tube Consider de-escalating tonight in setting of limited access. If patient clinical worsens will need central access for IV abx administration   11:41 AM : Will de-escalate to Augmentin for overnight. If able to regain IV access this evening or am will reconsider re-broadening antibiotics if clinically indicated   Intervention Category Minor Interventions: Other:  [name] 12/27/2021, 11:20 PM

## 2021-12-27 NOTE — Progress Notes (Signed)
Patient ID: [name], female   DOB: January 28, 1956, 66 y.o.   MRN: [id number]    Progress Note from the Palliative Medicine Team at [facility]   Patient Name: Erica [name]        Date: 12/27/2021 DOB: 1955/08/18  Age: 66 y.o. MRN#: [id number] Attending Physician: Laurin Coder, MD Primary Care Physician: System, Provider Not In Admit Date: 12/15/2021   Medical records reviewed, assessed the patient and discussed with treatment team  66 y.o. female  with past medical history of seizure disorder, ESRD, HTN, DM2, COPD, dCHF, gastroparesis, gerd, heart murmur, HLD, kidney stones, neuropathic pain/neuropathy, and depression admitted on 12/15/2021 with unresponsiveness and status epilepticus.   Significant Hospital events: 10/30 - CTA Head/Neck 1. No emergent large vessel occlusion or proximal hemodynamically significant stenosis. 2. Normal CT perfusion. 10/31 - R fem HD cath, EEG suggestive of moderate to severe diffuse encephalopathy, nonspecific etiology. No seizures or epileptiform discharges were seen throughout the recording. MRI Brain no evidence for acute stroke, left cortical ribbon, question consistent with seizure 11/4 - No seizures overnight. Plan to wean off propofol today and continue home AEDs.  11/5 - No seizures overnight. Hyponatremic (124) on morning BMP. 11/6 - Failed PSV/wean due to low Vt. Remains off of sedation since 11/4, minimally responsive. LTM EEG ongoing. 11/7 - Tolerating PSV 10/5. LTM EEG off.  MRI showed increased cortical signal consistent with recent seizure 11/7 MRI-IMPRESSION:1. Increased cortical signal on diffusion-weighted sequence and T2 weighted sequences in the left frontoparietal region, new compared to 12/16/2021, favored to represent sequela of a recent seizure, given the distribution. 11-11-  successfully extubated today, following simple commands     This NP assessed patient at the bedside for palliative  medicine needs and  emotional support.  I was able to speak with son/Calvin Owens Shark today by telephone along with his fiance Langley Gauss for continued conversation regarding current medical situation.  Education offered on patient's current medical condition specific to her multiple comorbidities; end-stage renal disease,  COPD, CHF, continued , physical functional and cognitive decline, and now seizure.  Education offered on concept specific to adult failure to thrive and the limitations of medical interventions to prolong quality of life when the body fails to thrive.  Education offered on concept specific to advance care planning; CODE STATUS, artificial feeding and hydration.    Education offered today regarding  the importance of continued conversation with family and their  medical providers regarding overall plan of care and treatment options,  ensuring decisions are within the context of the patients values and GOCs.  Agrees to meet with me on Monday at noon for continued conversation regarding neck steps in patient's treatment plan  Questions and concerns addressed   Discussed with Dr Ander Slade  70 minutes    50% of the time was spent in counseling coordination of care.   Wadie Lessen NP  Palliative Medicine Team Team Phone # [phone] Pager [phone]

## 2021-12-28 DIAGNOSIS — R4182 Altered mental status, unspecified: Secondary | ICD-10-CM | POA: Diagnosis not present

## 2021-12-28 LAB — CULTURE, RESPIRATORY W GRAM STAIN: Culture: NORMAL

## 2021-12-28 LAB — GLUCOSE, CAPILLARY
Glucose-Capillary: 118 mg/dL — ABNORMAL HIGH (ref 70–99)
Glucose-Capillary: 123 mg/dL — ABNORMAL HIGH (ref 70–99)
Glucose-Capillary: 125 mg/dL — ABNORMAL HIGH (ref 70–99)
Glucose-Capillary: 133 mg/dL — ABNORMAL HIGH (ref 70–99)
Glucose-Capillary: 159 mg/dL — ABNORMAL HIGH (ref 70–99)
Glucose-Capillary: 171 mg/dL — ABNORMAL HIGH (ref 70–99)
Glucose-Capillary: 202 mg/dL — ABNORMAL HIGH (ref 70–99)

## 2021-12-28 NOTE — Evaluation (Signed)
Physical Therapy Evaluation Patient Details Name: Erica [name] MRN: [id number] DOB: 05-11-1955 Today's Date: 12/28/2021  History of Present Illness  Pt is a 66 y/o female presenting from SNF on 10/30 to [facility] for EGD but procedure cancelled. Shortly after with L gaze and R sided weakness, code stroke- TNK administered and LUE dialysis access began to bleed. Pt transferred to [facility]. Imaging negative for CVA, but reveals seizures. Extubated 11/11. PMH includes: alcohol induced pancreatitis, CAD, CKD and on HD, DM, DKA, neuropathy, seizures, HTN.  Clinical Impression  Pt admitted with above diagnosis and presents to PT with functional limitations due to deficits listed below (See PT problem list). Pt needs skilled PT to maximize independence and safety to allow discharge back to SNF.         Recommendations for follow up therapy are one component of a multi-disciplinary discharge planning process, led by the attending physician.  Recommendations may be updated based on patient status, additional functional criteria and insurance authorization.  Follow Up Recommendations Skilled nursing-short term rehab (<3 hours/day) Can patient physically be transported by private vehicle: No    Assistance Recommended at Discharge Frequent or constant Supervision/Assistance  Patient can return home with the following  Two people to help with walking and/or transfers;Two people to help with bathing/dressing/bathroom;Assistance with feeding;Direct supervision/assist for medications management;Assist for transportation    Equipment Recommendations Other (comment) (To be assessed at next venue)  Recommendations for Other Services       Functional Status Assessment Patient has had a recent decline in their functional status and demonstrates the ability to make significant improvements in function in a reasonable and predictable amount of time.     Precautions / Restrictions Precautions Precautions:  Fall Restrictions Weight Bearing Restrictions: No      Mobility  Bed Mobility Overal bed mobility: Needs Assistance Bed Mobility: Supine to Sit, Sit to Supine, Rolling Rolling: Total assist, +2 for physical assistance, +2 for safety/equipment   Supine to sit: Total assist, +2 for physical assistance, +2 for safety/equipment Sit to supine: Total assist, +2 for physical assistance, +2 for safety/equipment   General bed mobility comments: Assist for all aspects    Transfers                   General transfer comment: deferred    Ambulation/Gait                  Stairs            Wheelchair Mobility    Modified Rankin (Stroke Patients Only)       Balance Overall balance assessment: Needs assistance Sitting-balance support: Feet unsupported, No upper extremity supported Sitting balance-Leahy Scale: Poor Sitting balance - Comments: Sat EOB x 10 minutes with min to mod assist.                                     Pertinent Vitals/Pain Pain Assessment Pain Assessment: Faces Faces Pain Scale: Hurts a little bit Pain Location: lower back Pain Descriptors / Indicators: Discomfort, Grimacing, Guarding Pain Intervention(s): Limited activity within patient's tolerance, Monitored during session, Repositioned    Home Living Family/patient expects to be discharged to:: Skilled nursing facility                        Prior Function Prior Level of Function : Needs assist  Mobility Comments: per chart review in 09/2021. [place] ambulator with RW and assist from son, mostly uses wheel chair for mobility prior to hospital stay in August- has been at [facility] since August. ADLs Comments: per chart review 09/2021- Pt reports assist needed from family for all ADL's but feeding. Has been as SNF since last admission     Hand Dominance   Dominant Hand: Right    Extremity/Trunk Assessment   Upper Extremity Assessment Upper  Extremity Assessment: Defer to OT evaluation RUE Deficits / Details: edema, PROM WFL but grossly 3-/5 MMT RUE Coordination: decreased gross motor;decreased fine motor LUE Deficits / Details: edema, PROM WFL but appears weaker than R UE grossly 2+/5    Lower Extremity Assessment Lower Extremity Assessment: RLE deficits/detail;LLE deficits/detail RLE Deficits / Details: grossly 2+ to 3-/5 LLE Deficits / Details: grossly 2+/5 except ankle 0/5       Communication   Communication: No difficulties  Cognition Arousal/Alertness: Awake/alert Behavior During Therapy: Flat affect Overall Cognitive Status: No family/caregiver present to determine baseline cognitive functioning Area of Impairment: Orientation, Attention, Following commands                 Orientation Level: Disoriented to, Place, Time, Situation Current Attention Level: Focused   Following Commands: Follows one step commands inconsistently, Follows one step commands with increased time       General Comments: pt oriented to self only, following some simple commands (squeeze hands, open mouth) with increased time but unable to follow functional commands to brush teeth or move towards EOB.        General Comments General comments (skin integrity, edema, etc.): VSS on 2L    Exercises     Assessment/Plan    PT Assessment Patient needs continued PT services  PT Problem List Decreased strength;Decreased activity tolerance;Decreased balance;Decreased mobility;Decreased cognition       PT Treatment Interventions DME instruction;Gait training;Functional mobility training;Therapeutic activities;Therapeutic exercise;Balance training;Cognitive remediation;Patient/family education;Neuromuscular re-education    PT Goals (Current goals can be found in the Care Plan section)  Acute Rehab PT Goals Patient Stated Goal: Pt unable PT Goal Formulation: Patient unable to participate in goal setting Time For Goal Achievement:  01/11/22 Potential to Achieve Goals: Fair    Frequency Min 2X/week     Co-evaluation PT/OT/SLP Co-Evaluation/Treatment: Yes Reason for Co-Treatment: For patient/therapist safety           AM-PAC PT "6 Clicks" Mobility  Outcome Measure Help needed turning from your back to your side while in a flat bed without using bedrails?: Total Help needed moving from lying on your back to sitting on the side of a flat bed without using bedrails?: Total Help needed moving to and from a bed to a chair (including a wheelchair)?: Total Help needed standing up from a chair using your arms (e.g., wheelchair or bedside chair)?: Total Help needed to walk in hospital room?: Total Help needed climbing 3-5 steps with a railing? : Total 6 Click Score: 6    End of Session Equipment Utilized During Treatment: Oxygen Activity Tolerance: Patient limited by fatigue Patient left: in bed;with call bell/phone within reach;with bed alarm set   PT Visit Diagnosis: Other abnormalities of gait and mobility (R26.89);Muscle weakness (generalized) (M62.81)    Time: 9417-4081 PT Time Calculation (min) (ACUTE ONLY): 23 min   Charges:   PT Evaluation $PT Eval Moderate Complexity: [place] [facility] [place]  W [place] 12/28/2021, 11:15 AM

## 2021-12-28 NOTE — Progress Notes (Signed)
NAME:  Erica [name], MRN:  [id number], DOB:  31-Dec-1955, LOS: 9 ADMISSION DATE:  12/15/2021, CONSULTATION DATE:  12/19/2021 REFERRING MD:   Ova Freshwater - EDP CHIEF COMPLAINT:  Altered Mental Status    History of Present Illness:  66 year old woman who presented to [place] from [facility] SNF for scheduled EGD; this was cancelled as chewing tobacco was found in patient's mouth at the time of assessment. PMHx significant for HTN, HLD, CHF, seizure disorder (on Keppra), T2DM with peripheral neuropathy, ESRD on HD (TTS via LUE AVF).  Shortly after this, patient had L-sided gaze deviation with concomitant R-sided weakness and concern for stroke. Code Stroke was called. CT Head NAICA, TNK was administered at 1349. CTA Head/Neck negative for LVO. While in ED, post-TNK administration patient's LUE AVF dialysis access began to bleed. Pressure was applied and clamps placed. Patient was transferred to [facility] for higher level of care.    PCCM ground team called to ED to reassess patient after [facility] ED arrival. On arrival, patient reportedly had expanding hematoma at LUE AVF access site. [facility] ED RN applied circumferential pressure which allowed for hemostasis. Dr. Virl Cagey (VVS) was present at bedside; fortunately no urgent surgical intervention was needed. Ace bandage x 2 were applied for compression and the access site remained hemostatic. Due to recent TNK administration and active bleeding, temporary dialysis access was recommended (discussed with son, Erica [name], and consent obtained via phone - please see procedure note dated 10/31AM).  Pertinent Medical History:  ESRD on HD, left arm AV fistula Hypertension Seizure disorder, on Keppra Diabetes type 2, on insulin  Significant Hospital Events: Including procedures, antibiotic start and stop dates in addition to other pertinent events   10/30 - CTA Head/Neck 1. No emergent large vessel occlusion or proximal hemodynamically significant stenosis. 2. Normal CT  perfusion. 10/31 - R fem HD cath, EEG suggestive of moderate to severe diffuse encephalopathy, nonspecific etiology. No seizures or epileptiform discharges were seen throughout the recording. MRI Brain no evidence for acute stroke, left cortical ribbon, question consistent with seizure 11/4 - No seizures overnight. Plan to wean off propofol today and continue home AEDs.  11/5 - No seizures overnight. Hyponatremic (124) on morning BMP. 11/6 - Failed PSV/wean due to low Vt. Remains off of sedation since 11/4, minimally responsive. LTM EEG ongoing. 11/7 - Tolerating PSV 10/5. LTM EEG off.  MRI showed increased cortical signal consistent with recent seizure 11/7 MRI-IMPRESSION:1. Increased cortical signal on diffusion-weighted sequence and T2 weighted sequences in the left frontoparietal region, new compared to 12/16/2021, favored to represent sequela of a recent seizure, given the distribution. 11/11 extubated   Interim History / Subjective:   Remains critically ill, extubated 11/11 Arouses easily, follows commands Very weak  Objective:  Blood pressure (!) 174/84, pulse (!) 112, temperature (!) 102.7 F (39.3 C), temperature source Axillary, resp. rate (!) 37, height 4\' 8"  (1.422 m), weight 51.9 kg, SpO2 98 %.    Vent Mode: PSV;CPAP FiO2 (%):  [30 %-32 %] 32 % PEEP:  [5 cmH20] 5 cmH20 Pressure Support:  [5 cmH20] 5 cmH20   Intake/Output Summary (Last 24 hours) at 12/28/2021 0742 Last data filed at 12/28/2021 0600 Gross per 24 hour  Intake 1405.14 ml  Output 30 ml  Net 1375.14 ml   Filed Weights   12/26/21 1424 12/27/21 0417 12/28/21 0500  Weight: 52.2 kg 52.9 kg 51.9 kg   Physical Examination: General: Chronically ill-appearing HEENT: Moist oral mucosa, endotracheal tube in place Neuro: Very weak, follows  commands CV: S1-S2 appreciated  PULM: Clear to auscultation GI: Bowel sounds appreciated Extremities: Bilateral upper extremity edema Skin: Warm/dry, no rashes.  Labs:  Leukocytosis, anemia  Resolved Hospital Problem List:     Assessment & Plan:   Nonconvulsive status epilepticus Metabolic encephalopathy History of epilepsy/seizure disorder -Antiepileptics including Keppra Vimpat phenytoin -Ativan for breakthrough seizures -[name]  Acute hypoxic respiratory failure -Was able to extubate 11/11 -Continue oxygen supplementation  End-stage renal disease on hemodialysis -Left upper extremity access  Fever/leukocytosis -Did have 3 days of vancomycin/Zosyn -Switched over to Augmentin-not able to obtain IV access at present -Respiratory cultures not showing any organisms -Blood cultures no organism -Monitor closely  Anemia secondary to blood loss -Transfuse 1 unit  Type 2 diabetes Hypoglycemia -Continue to monitor blood sugar  Severe malnutrition -Continue enteral supplementation  Difficult IV access -IV team to attempt access  Appreciate palliative care involvement -Meeting scheduled with family on 12/29/2021   Best Practice: (right click and "Reselect all SmartList Selections" daily)   Diet/type: tubefeeds DVT prophylaxis: SCD GI prophylaxis: H2B Lines: Dialysis Catheter and yes and it is still needed Foley:  N/A Code Status:  full code Last date of multidisciplinary goals of care discussion: Son updated via phone 11/7.  PMT following  The patient is critically ill with multiple organ systems failure and requires high complexity decision making for assessment and support, frequent evaluation and titration of therapies, application of advanced monitoring technologies and extensive interpretation of multiple databases. Critical Care Time devoted to patient care services described in this note independent of APP/resident time (if applicable)  is 31 minutes.   Sherrilyn Rist MD [place] [facility] Personal pager: See Amion If unanswered, please page CCM On-call:  [phone]

## 2021-12-28 NOTE — Progress Notes (Signed)
Assessed pt RUE for PIV.  Edematous with sporadic blisters/open wounds present.  Protonix only PIV med.  Spoke with RN and dr Ander Slade re salvage any veins, change IV to po,allow healing time and notify if any change in status needing PIV.  All in agreement to leave PIV out at this time.

## 2021-12-28 NOTE — Evaluation (Signed)
Occupational Therapy Evaluation Patient Details Name: Erica [name] MRN: [id number] DOB: 13-May-1955 Today's Date: 12/28/2021   History of Present Illness Pt is a 66 y/o female presenting from SNF on 10/30 to [facility] for EGD but procedure cancelled. Shortly after with L gaze and R sided weakness, code stroke- TNK administered and LUE dialysis access began to bleed. Pt transferred to [facility]. Imaging negative for CVA, but reveals seizures. Extubated 11/11. PMH includes: alcohol induced pancreatitis, CAD, CKD and on HD, DM, DKA, neuropathy, seizures, HTN.   Clinical Impression   Patient admitted for above and presents with problem list below, including impaired cognition, decreaed activity tolerance, impaired balance, and generalized weakness.  Patient with BUE edema and L UE appears weaker than L UE, gaze preference to R but able to scan to L side given cueing and increased time. Patient oriented to self only, follows some simple commands with delay, she demonstrates poor awareness, problem solving.  She completes bed mobility with total assist +2, Adls with total assist.  Believe she will benefit from continued OT service acutely and after dc at SNF level to decrease burden of care. Will follow.      Recommendations for follow up therapy are one component of a multi-disciplinary discharge planning process, led by the attending physician.  Recommendations may be updated based on patient status, additional functional criteria and insurance authorization.   Follow Up Recommendations  Skilled nursing-short term rehab (<3 hours/day)    Assistance Recommended at Discharge Frequent or constant Supervision/Assistance  Patient can return home with the following Two people to help with walking and/or transfers;Two people to help with bathing/dressing/bathroom;Assistance with feeding;Direct supervision/assist for medications management;Direct supervision/assist for financial management;Assist for  transportation;Help with stairs or ramp for entrance    Functional Status Assessment  Patient has had a recent decline in their functional status and demonstrates the ability to make significant improvements in function in a reasonable and predictable amount of time.  Equipment Recommendations  Other (comment) (defer)    Recommendations for Other Services       Precautions / Restrictions Precautions Precautions: Fall Restrictions Weight Bearing Restrictions: No      Mobility Bed Mobility Overal bed mobility: Needs Assistance Bed Mobility: Supine to Sit, Sit to Supine, Rolling Rolling: Total assist, +2 for physical assistance, +2 for safety/equipment   Supine to sit: Total assist, +2 for physical assistance, +2 for safety/equipment Sit to supine: Total assist, +2 for physical assistance, +2 for safety/equipment   General bed mobility comments: total assist, no initation to follow commands for transition to EOB    Transfers                   General transfer comment: deferred      Balance Overall balance assessment: Needs assistance Sitting-balance support: Feet unsupported, No upper extremity supported Sitting balance-Leahy Scale: Poor Sitting balance - Comments: relies on external support statically                                   ADL either performed or assessed with clinical judgement   ADL Overall ADL's : Needs assistance/impaired     Grooming: Oral care;Total assistance;Bed level                               Functional mobility during ADLs: Total assistance;+2 for physical assistance;+2 for safety/equipment General ADL  Comments: total assist     Vision   Vision Assessment?: Yes;Vision impaired- to be further tested in functional context Additional Comments: pt with R gaze head turn preference, able to scan to L with increased time but immediately returns to R     Perception     Praxis      Pertinent Vitals/Pain  Pain Assessment Pain Assessment: Faces Faces Pain Scale: Hurts a little bit Pain Location: lower back Pain Descriptors / Indicators: Discomfort, Grimacing, Guarding Pain Intervention(s): Limited activity within patient's tolerance, Monitored during session, Repositioned     Hand Dominance Right   Extremity/Trunk Assessment Upper Extremity Assessment Upper Extremity Assessment: RUE deficits/detail;LUE deficits/detail RUE Deficits / Details: edema, PROM WFL but grossly 3-/5 MMT RUE Coordination: decreased gross motor;decreased fine motor LUE Deficits / Details: edema, PROM WFL but appears weaker than R UE grossly 2+/5   Lower Extremity Assessment Lower Extremity Assessment: Defer to PT evaluation       Communication Communication Communication: No difficulties   Cognition Arousal/Alertness: Awake/alert Behavior During Therapy: Flat affect Overall Cognitive Status: No family/caregiver present to determine baseline cognitive functioning Area of Impairment: Orientation, Attention, Following commands                 Orientation Level: Disoriented to, Place, Time, Situation Current Attention Level: Focused   Following Commands: Follows one step commands inconsistently, Follows one step commands with increased time       General Comments: pt oriented to self only, following some simple commands (squeeze hands, open mouth) with increased time but unable to follow functional commands to brush teeth or move towards EOB.     General Comments       Exercises     Shoulder Instructions      Home Living Family/patient expects to be discharged to:: Skilled nursing facility                                        Prior Functioning/Environment Prior Level of Function : Needs assist             Mobility Comments: per chart review in 09/2021. [place] ambulator with RW and assist from son, mostly uses wheel chair for mobility- has been at [facility]. ADLs  Comments: per chart review 09/2021- Pt reports assist needed from family for all ADL's but feeding. Has been as SNF since last admission        OT Problem List: Decreased strength;Decreased activity tolerance;Decreased range of motion;Impaired balance (sitting and/or standing);Impaired vision/perception;Decreased coordination;Decreased cognition;Decreased safety awareness;Decreased knowledge of use of DME or AE;Decreased knowledge of precautions      OT Treatment/Interventions: Self-care/ADL training;Therapeutic exercise;DME and/or AE instruction;Therapeutic activities;Patient/family education;Balance training;Visual/perceptual remediation/compensation;Cognitive remediation/compensation    OT Goals(Current goals can be found in the care plan section) Acute Rehab OT Goals Patient Stated Goal: none stated OT Goal Formulation: Patient unable to participate in goal setting Time For Goal Achievement: 01/11/22 Potential to Achieve Goals: Fair  OT Frequency: Min 2X/week    Co-evaluation PT/OT/SLP Co-Evaluation/Treatment: Yes Reason for Co-Treatment: For patient/therapist safety;To address functional/ADL transfers;Necessary to address cognition/behavior during functional activity          AM-PAC OT "6 Clicks" Daily Activity     Outcome Measure Help from another person eating meals?: Total Help from another person taking care of personal grooming?: Total Help from another person toileting, which includes using toliet, bedpan, or urinal?: Total Help  from another person bathing (including washing, rinsing, drying)?: Total Help from another person to put on and taking off regular upper body clothing?: Total Help from another person to put on and taking off regular lower body clothing?: Total 6 Click Score: 6   End of Session Equipment Utilized During Treatment: Oxygen Nurse Communication: Mobility status  Activity Tolerance: Patient tolerated treatment well Patient left: in bed;with call  bell/phone within reach;with bed alarm set  OT Visit Diagnosis: Other abnormalities of gait and mobility (R26.89);Muscle weakness (generalized) (M62.81);Pain;Other symptoms and signs involving cognitive function;Other symptoms and signs involving the nervous system (R29.898)                Time: 3868-5488 OT Time Calculation (min): 23 min Charges:  OT General Charges $OT Visit: 1 Visit OT Evaluation $OT Eval Moderate Complexity: 1 Mod  Jolaine Artist, OT Acute Rehabilitation Services Office [phone]   Delight Stare 12/28/2021, 10:52 AM

## 2021-12-28 NOTE — Progress Notes (Signed)
[facility] Progress Note   Erica [name] 3hr 45 in EDW 45kg 400/500 2/3 bath Calcitriol 0.75 PO TIW Heparin 1000 loading -> 500/hr Mircera 60 q2weeks   Assessment/ Plan:   Assessment/Plan: 66 year old BF with multiple medical issues including ESRD-  now admitted with code stroke s/p TNK  1 new CVA- with lateral gaze and right sided weakness-  s/p TNK per neurology.  EEG with nonconvulsive status, per neuro 2 ESRD: normally TTS at [facility]'; lt upper arm loop with bypass graft in arterial limb placed on 11/04/2021.    Removed the rt fem temp on 11/9 given fevers and we were fortunately able to utilize the AVG again. Arterial limb access on 11/10 successfully.   Able to tolerate a 3h88min treatment on 11/10 with hemostasis within 10 minutes. 3L net UF. Next HD on Monday. MWF while in the hospital and will transition as she's closer to d/c.  4. Anemia of ESRD: hgb actually looks ok (8.4)  -  anticipate it will go down with time.No IV  iron with recent fevers and also started Mircera 60 (given 11/11) to hopefully avoid a blood transfusion 5. Metabolic Bone Disease: continue calcitriol , resume auryxia once eating  6.  Bleeding AVG -  as above (may have outflow VAS) 7. Hyperkalemia-  resolved 8.  Nonconvulsive status epilepticus: intubated, on propofol for burst suppression 9.  Dispo: needs to get pall care c/s    Subjective:     Prolonged bleeding after needles pulled post hd tx through LUA AVG previously but that was from cannulation of medial venous limb but did ok on 11/10. Infiltration higher in arm but no e/o active bleed. Fever of 101 -> rt fem cath rem 11/9.  Extubated now    Objective:   BP (!) 174/84   Pulse (!) 112   Temp (!) 97.4 F (36.3 C) (Axillary)   Resp (!) 37   Ht 4\' 8"  (1.422 m)   Wt 51.9 kg   SpO2 98%   BMI 25.65 kg/m   Intake/Output Summary (Last 24 hours) at 12/28/2021 0813 Last data filed at 12/28/2021 0600 Gross per 24 hour  Intake  1350.14 ml  Output 0 ml  Net 1350.14 ml   Weight change: -0.3 kg  Physical Exam: Gen:NAD, extubated, arousable and opening eyes CVS: RRR Resp: clear Abd: soft Ext: R sided weakness Neuro: intubated  ACCESS: Lt loop AVG (arterial limb is lateral) + B, no further bleeding.  + two blisters/ skin tears on medial aspect (venous limb), infiltration higher in arm but no active bleed  Imaging: No results found.  Labs: BMET Recent Labs  Lab 12/22/21 0314 12/23/21 0039 12/24/21 1231 12/25/21 0327 12/26/21 1435 12/27/21 0212  NA 124* 124* 129* 129* 130* 132*  K 5.0 5.1 4.3 3.6 4.2 4.0  CL 85* 87* 90* 90* 94* 94*  CO2 23 21* 24 23 22 25   GLUCOSE 106* 118* 184* 160* 154* 193*  BUN 45* 58* 52* 35* 59* 32*  CREATININE 2.96* 3.48* 2.84* 1.85* 2.73* 1.69*  CALCIUM 8.8* 8.7* 8.8* 8.4* 8.6* 8.6*  PHOS  --  3.4 3.5 2.6 3.9  --    CBC Recent Labs  Lab 12/24/21 1231 12/25/21 0355 12/26/21 1435 12/27/21 0212  WBC 13.5* 18.6* 15.3* 15.1*  NEUTROABS  --  15.6*  --  12.8*  HGB 8.4* 7.5* 6.7* 8.4*  HCT 26.7* 23.4* 20.7* 26.4*  MCV 99.6 99.2 99.0 95.3  PLT 147* 140* 174 192  Medications:     sodium chloride   Intravenous Once   amoxicillin-clavulanate  500 mg Per Tube Q12H   Chlorhexidine Gluconate Cloth  6 each Topical Daily   famotidine  10 mg Per Tube Daily   folic acid  1 mg Per Tube Q1200   heparin injection (subcutaneous)  5,000 Units Subcutaneous Q12H   insulin aspart  0-9 Units Subcutaneous Q4H   lacosamide  150 mg Per Tube BID   levETIRAcetam  500 mg Per Tube BID   multivitamin  1 tablet Per Tube QHS   mouth rinse  15 mL Mouth Rinse 4 times per day   pantoprazole (PROTONIX) IV  40 mg Intravenous QHS   phenytoin  100 mg Per Tube Q8H   sodium chloride flush  10-40 mL Intracatheter Q12H   vitamin E  400 Units Per Tube Daily

## 2021-12-28 NOTE — Evaluation (Signed)
Clinical/Bedside Swallow Evaluation Patient Details  Name: Erica [name] MRN: [id number] Date of Birth: October 22, 1955  Today's Date: 12/28/2021 Time: SLP Start Time (ACUTE ONLY): 0930 SLP Stop Time (ACUTE ONLY): 0950 SLP Time Calculation (min) (ACUTE ONLY): 20 min  Past Medical History:  Past Medical History:  Diagnosis Date   Alcohol-induced pancreatitis    CAD (coronary artery disease) 09/17/2020   Chronic diarrhea    Chronic kidney disease    Depression    Diabetes mellitus    fasting blood sugar 110-120s   Dialysis patient ([place])    Diastolic CHF ([place])    DKA (diabetic ketoacidoses)    Gastroparesis    GERD (gastroesophageal reflux disease)    Heart murmur    History of kidney stones    Hyperlipidemia    Hypertension    Hypokalemia    Muscle spasm    Neuropathic pain    Neuropathy    Hx: of   Pyelonephritis    Seizures ([place])    Vitamin B12 deficiency    Vitamin D deficiency    Past Surgical History:  Past Surgical History:  Procedure Laterality Date   AV FISTULA PLACEMENT Left 12/15/2017   Procedure: INSERTION OF ARTERIOVENOUS (AV) GORE-TEX GRAFT ARM;  Surgeon: Rosetta Posner, MD;  Location: [place];  Service: Vascular;  Laterality: Left;   CATARACT EXTRACTION W/PHACO Right 03/14/2015   Procedure: CATARACT EXTRACTION PHACO AND INTRAOCULAR LENS PLACEMENT ([place]);  Surgeon: Baruch Goldmann, MD;  Location: [facility];  Service: Ophthalmology;  Laterality: Right;  CDE:11.13   CATARACT EXTRACTION W/PHACO Left 04/11/2015   Procedure: CATARACT EXTRACTION PHACO AND INTRAOCULAR LENS PLACEMENT LEFT EYE CDE=9.68;  Surgeon: Baruch Goldmann, MD;  Location: [facility];  Service: Ophthalmology;  Laterality: Left;   COLONOSCOPY  02/24/2010   fair prep, limited exam. Two small polyps removed. Tubular adenoma   cystoscopy with ureteral stent  Bilateral 10/07/2017   At [place] CV LINE RIGHT  11/17/2017   IR FLUORO GUIDE CV LINE RIGHT  11/22/2017   IR REMOVAL TUN CV  CATH W/O FL  01/26/2018   IR US GUIDE VASC ACCESS RIGHT  11/17/2017   MULTIPLE EXTRACTIONS WITH ALVEOLOPLASTY N/A 06/13/2012   Procedure: MULTIPLE EXTRACION WITH ALVEOLOPLASTY EXTRACT: 18, 19, 20, 21, 22, 24, 25, 27, 28, 29, 30, 31;  Surgeon: Gae Bon, DDS;  Location: [place];  Service: Oral Surgery;  Laterality: N/A;   ORIF HUMERUS FRACTURE Right 04/20/2019   Procedure: OPEN REDUCTION INTERNAL FIXATION (ORIF) PROXIMAL HUMERUS FRACTURE;  Surgeon: Marchia Bond, MD;  Location: [place];  Service: Orthopedics;  Laterality: Right;   REVISION OF ARTERIOVENOUS GORETEX GRAFT Left 11/04/2021   Procedure: REVISION OF LEFT UPPER ARM ARTERIOVENOUS GORETEX GRAFT WITH REPLACEMENT OF LATERAL ARTERIAL HALF OF GRAFT;  Surgeon: Rosetta Posner, MD;  Location: [facility];  Service: Vascular;  Laterality: Left;   TUBAL LIGATION     URETERAL STENT PLACEMENT  09/2017   HPI:  66 y.o. female  with past medical history of seizure disorder, ESRD, HTN, DM2, COPD, dCHF, gastroparesis, gerd, heart murmur, HLD, kidney stones, neuropathic pain/neuropathy, and depression admitted on 12/15/2021 with unresponsiveness and status epilepticus.    Assessment / Plan / Recommendation  Clinical Impression  Patient presents with a severe acute reversible dysphagia s/p prolonged intubation characterized by aphonia due to suspected laryngeal/pharyngeal edema and decreased glottal closure.  Multiple swallows noted consistently across po trials. Weak cough response noted with 1/2 tsp amount of thin liquid  indicative of decreased airway protection. Recommend strict NPO. SLP will f/u for po readiness. Will likely require instrumental testing when showing improved vocal quality and ability to better tolerate at least one consistency at bedside. SLP Visit Diagnosis: Dysphagia, oropharyngeal phase (R13.12)    Aspiration Risk  Severe aspiration risk    Diet Recommendation NPO   Medication Administration: Via alternative means    Other   Recommendations Oral Care Recommendations: Oral care QID    Recommendations for follow up therapy are one component of a multi-disciplinary discharge planning process, led by the attending physician.  Recommendations may be updated based on patient status, additional functional criteria and insurance authorization.  Follow up Recommendations Skilled nursing-short term rehab (<3 hours/day)      Assistance Recommended at Discharge Frequent or constant Supervision/Assistance  Functional Status Assessment    Frequency and Duration min 2x/week  2 weeks       Prognosis Prognosis for Safe Diet Advancement: Good Barriers to Reach Goals: Severity of deficits      Swallow Study   General HPI: 66 y.o. female  with past medical history of seizure disorder, ESRD, HTN, DM2, COPD, dCHF, gastroparesis, gerd, heart murmur, HLD, kidney stones, neuropathic pain/neuropathy, and depression admitted on 12/15/2021 with unresponsiveness and status epilepticus. Type of Study: Bedside Swallow Evaluation Diet Prior to this Study: NPO;NG Tube Temperature Spikes Noted: No Respiratory Status: Nasal cannula History of Recent Intubation: Yes Length of Intubations (days): 13 days Date extubated: 12/27/21 Behavior/Cognition: Alert;Cooperative;Pleasant mood;Confused Oral Cavity Assessment: Dried secretions Oral Care Completed by SLP: Recent completion by staff Oral Cavity - Dentition: Edentulous Vision: Functional for self-feeding Self-Feeding Abilities: Total assist Patient Positioning: Upright in bed Baseline Vocal Quality: Aphonic Volitional Cough: Weak Volitional Swallow: Able to elicit    Oral/Motor/Sensory Function Overall Oral Motor/Sensory Function: Generalized oral weakness   Ice Chips Ice chips: Impaired Presentation: Spoon Oral Phase Functional Implications: Prolonged oral transit Pharyngeal Phase Impairments: Multiple swallows   Thin Liquid Thin Liquid: Impaired Presentation: Cup Pharyngeal   Phase Impairments: Multiple swallows;Cough - Immediate    Nectar Thick Nectar Thick Liquid: Not tested   Honey Thick Honey Thick Liquid: Not tested   Puree Puree: Not tested   Solid     Solid: Not tested     Erica Milner MA, CCC-SLP  Erica Dubin Meryl 12/28/2021,9:56 AM

## 2021-12-29 ENCOUNTER — Inpatient Hospital Stay (HOSPITAL_COMMUNITY): Payer: Medicare Other

## 2021-12-29 DIAGNOSIS — G40901 Epilepsy, unspecified, not intractable, with status epilepticus: Secondary | ICD-10-CM | POA: Diagnosis not present

## 2021-12-29 DIAGNOSIS — R404 Transient alteration of awareness: Secondary | ICD-10-CM | POA: Diagnosis not present

## 2021-12-29 DIAGNOSIS — Z515 Encounter for palliative care: Secondary | ICD-10-CM | POA: Diagnosis not present

## 2021-12-29 LAB — CBC WITH DIFFERENTIAL/PLATELET
Abs Immature Granulocytes: 0.09 10*3/uL — ABNORMAL HIGH (ref 0.00–0.07)
Basophils Absolute: 0 10*3/uL (ref 0.0–0.1)
Basophils Relative: 0 %
Eosinophils Absolute: 0.2 10*3/uL (ref 0.0–0.5)
Eosinophils Relative: 1 %
HCT: 24.9 % — ABNORMAL LOW (ref 36.0–46.0)
Hemoglobin: 8.3 g/dL — ABNORMAL LOW (ref 12.0–15.0)
Immature Granulocytes: 1 %
Lymphocytes Relative: 8 %
Lymphs Abs: 1.2 10*3/uL (ref 0.7–4.0)
MCH: 30.6 pg (ref 26.0–34.0)
MCHC: 33.3 g/dL (ref 30.0–36.0)
MCV: 91.9 fL (ref 80.0–100.0)
Monocytes Absolute: 1.5 10*3/uL — ABNORMAL HIGH (ref 0.1–1.0)
Monocytes Relative: 10 %
Neutro Abs: 12.4 10*3/uL — ABNORMAL HIGH (ref 1.7–7.7)
Neutrophils Relative %: 80 %
Platelets: 281 10*3/uL (ref 150–400)
RBC: 2.71 MIL/uL — ABNORMAL LOW (ref 3.87–5.11)
RDW: 18.9 % — ABNORMAL HIGH (ref 11.5–15.5)
WBC: 15.4 10*3/uL — ABNORMAL HIGH (ref 4.0–10.5)
nRBC: 0 % (ref 0.0–0.2)

## 2021-12-29 LAB — LACTIC ACID, PLASMA: Lactic Acid, Venous: 1.1 mmol/L (ref 0.5–1.9)

## 2021-12-29 LAB — BASIC METABOLIC PANEL
Anion gap: 21 — ABNORMAL HIGH (ref 5–15)
BUN: 83 mg/dL — ABNORMAL HIGH (ref 8–23)
CO2: 19 mmol/L — ABNORMAL LOW (ref 22–32)
Calcium: 9.3 mg/dL (ref 8.9–10.3)
Chloride: 88 mmol/L — ABNORMAL LOW (ref 98–111)
Creatinine, Ser: 3.42 mg/dL — ABNORMAL HIGH (ref 0.44–1.00)
GFR, Estimated: 14 mL/min — ABNORMAL LOW (ref >60)
Glucose, Bld: 178 mg/dL — ABNORMAL HIGH (ref 70–99)
Potassium: 5.9 mmol/L — ABNORMAL HIGH (ref 3.5–5.1)
Sodium: 128 mmol/L — ABNORMAL LOW (ref 135–145)

## 2021-12-29 LAB — GLUCOSE, CAPILLARY
Glucose-Capillary: 179 mg/dL — ABNORMAL HIGH (ref 70–99)
Glucose-Capillary: 101 mg/dL — ABNORMAL HIGH (ref 70–99)
Glucose-Capillary: 173 mg/dL — ABNORMAL HIGH (ref 70–99)
Glucose-Capillary: 158 mg/dL — ABNORMAL HIGH (ref 70–99)
Glucose-Capillary: 152 mg/dL — ABNORMAL HIGH (ref 70–99)
Glucose-Capillary: 131 mg/dL — ABNORMAL HIGH (ref 70–99)

## 2021-12-29 LAB — POCT I-STAT 7, (LYTES, BLD GAS, ICA,H+H)
Acid-base deficit: 1 mmol/L (ref 0.0–2.0)
Bicarbonate: 22.1 mmol/L (ref 20.0–28.0)
Calcium, Ion: 1.17 mmol/L (ref 1.15–1.40)
HCT: 27 % — ABNORMAL LOW (ref 36.0–46.0)
Hemoglobin: 9.2 g/dL — ABNORMAL LOW (ref 12.0–15.0)
O2 Saturation: 92 %
Patient temperature: 98.8
Potassium: 6.1 mmol/L — ABNORMAL HIGH (ref 3.5–5.1)
Sodium: 126 mmol/L — ABNORMAL LOW (ref 135–145)
TCO2: 23 mmol/L (ref 22–32)
pCO2 arterial: 31.9 mmHg — ABNORMAL LOW (ref 32–48)
pH, Arterial: 7.448 (ref 7.35–7.45)
pO2, Arterial: 59 mmHg — ABNORMAL LOW (ref 83–108)

## 2021-12-29 LAB — ZINC: Zinc: 62 ug/dL (ref 44–115)

## 2021-12-29 LAB — MAGNESIUM: Magnesium: 1.9 mg/dL (ref 1.7–2.4)

## 2021-12-29 MED ORDER — PROSOURCE TF20 ENFIT COMPATIBL EN LIQD
60.0000 mL | Freq: Every day | ENTERAL | Status: DC
  Administered 2021-12-29 – 2021-12-31 (×3): 60 mL
  Filled 2021-12-29 (×3): qty 60

## 2021-12-29 MED ORDER — MAGNESIUM SULFATE 2 GM/50ML IV SOLN
2.0000 g | Freq: Once | INTRAVENOUS | Status: AC
  Administered 2021-12-29: 2 g via INTRAVENOUS
  Filled 2021-12-29: qty 50

## 2021-12-29 MED ORDER — PENTAFLUOROPROP-TETRAFLUOROETH EX AERO
INHALATION_SPRAY | CUTANEOUS | Status: AC
  Filled 2021-12-29: qty 30

## 2021-12-29 MED ORDER — DARBEPOETIN ALFA 60 MCG/0.3ML IJ SOSY: 60.0000 ug | PREFILLED_SYRINGE | INTRAMUSCULAR | Status: DC

## 2021-12-29 MED ORDER — ONDANSETRON HCL 4 MG PO TABS
4.0000 mg | ORAL_TABLET | Freq: Two times a day (BID) | ORAL | Status: DC | PRN
  Administered 2021-12-29: 4 mg via ORAL
  Filled 2021-12-29: qty 1

## 2021-12-29 MED ORDER — JUVEN PO PACK
1.0000 | PACK | Freq: Two times a day (BID) | ORAL | Status: DC
  Administered 2021-12-29 – 2021-12-31 (×5): 1
  Filled 2021-12-29 (×5): qty 1

## 2021-12-29 MED ORDER — PEPTAMEN 1.5 CAL PO LIQD
1000.0000 mL | ORAL | Status: DC
  Administered 2021-12-29 – 2021-12-31 (×3): 1000 mL
  Filled 2021-12-29 (×5): qty 1000

## 2021-12-29 NOTE — Progress Notes (Addendum)
[facility] NEPHROLOGY PROGRESS NOTE  Assessment/ Plan: Pt is a 66 y.o. yo female with HTN, DM, CHF, seizure disorder, ESRD on HD TTS, admitted with status epilepticus, metabolic encephalopathy.  #Nonconvulsive status epilepticus, per neurology MRI changes are due to prolonged status epilepticus.  Per neurology team.  # ESRD normally TTS at [facility].  Using AV graft for the dialysis.  It seems like the original plan was to do dialysis today.  However because of staffing issues at HD unit and no urgent indication for dialysis, I will postpone her HD to tomorrow.  This will help transition to her outpatient schedule as well.  # Anemia: Iron saturation 38 very high ferritin level. Start erythropoietin with HD.  Monitor hemoglobin.  # Secondary hyperparathyroidism: Calcium phosphorus level acceptable.  Currently n.p.o. and not on binders.  # HTN/volume: BP elevated.  On hydralazine IV as needed.  Volume managed with HD.  #Fever/leukocytosis: Currently on Augmentin, per primary team.  Addendum: Received a message from ICU team that they are concerned about patient's respiratory status and possible volume overload.  Chest x-ray is still pending.  They are requesting if we can do dialysis today instead tomorrow.  I have changed the order back to today and informed dialysis unit.   Subjective: Seen and examined in ICU.  Patient is alert awake but confused.  In room air.  Temperature of 100.6 morning.  No other new event. Objective Vital signs in last 24 hours: Vitals:   12/29/21 0733 12/29/21 0800 12/29/21 0900 12/29/21 1000  BP: (!) 172/81 (!) 155/69 (!) 165/84 (!) 162/74  Pulse: 100 95 99 94  Resp: (!) 33 (!) 32 (!) 38 (!) 32  Temp:      TempSrc:      SpO2: 95% 93% 95% 93%  Weight:      Height:       Weight change:   Intake/Output Summary (Last 24 hours) at 12/29/2021 1114 Last data filed at 12/29/2021 0945 Gross per 24 hour  Intake 1470 ml  Output --  Net 1470  ml       Labs: RENAL PANEL Recent Labs    10/17/21 0444 10/18/21 0528 10/19/21 0502 11/04/21 1218 12/15/21 1325 12/15/21 1349 12/15/21 2140 12/16/21 0212 12/16/21 0905 12/17/21 1630 12/18/21 0358 12/18/21 1428 12/18/21 1645 12/19/21 0339 12/19/21 1835 12/20/21 0508 12/21/21 0210 12/21/21 0512 12/22/21 0314 12/23/21 0039 12/24/21 1231 12/25/21 0327 12/26/21 1435 12/27/21 0212  NA 142 142 140   < > 123*   < > 126* 128*   < >  --  136 136   < > 132*  --  132* 123* 124* 124* 124* 129* 129* 130* 132*  K 2.9* 3.9 3.6   < > 7.2*   < > 7.2* 6.1*   < >  --  2.7* 3.8   < > 3.6  --  3.4* 4.2 4.3 5.0 5.1 4.3 3.6 4.2 4.0  CL 108 110 107   < > 89*   < > 88* 89*   < >  --  93* 95*  --  98  --  89* 85* 87* 85* 87* 90* 90* 94* 94*  CO2 27 27 28   --  21*   < > 20* 21*   < >  --  28 28  --  24  --  25 24 25 23  21* 24 23 22 25   GLUCOSE 153* 137* 204*   < > 131*   < > 71 107*   < >  --  140* 148*  --  111*  --  196* 188* 226* 106* 118* 184* 160* 154* 193*  BUN 14 20 14    < > 55*   < > 55* 57*   < >  --  11 15  --  20  --  15 26* 27* 45* 58* 52* 35* 59* 32*  CREATININE 2.60* 3.68* 2.35*   < > 3.79*   < > 4.16* 4.45*   < >  --  1.91* 2.22*  --  2.50*  --  1.80* 2.39* 2.46* 2.96* 3.48* 2.84* 1.85* 2.73* 1.69*  CALCIUM 8.0* 8.1* 7.8*  --  9.8   < > 10.0 10.1   < >  --  9.2 8.8*  --  8.4*  --  8.7* 8.4* 8.6* 8.8* 8.7* 8.8* 8.4* 8.6* 8.6*  MG 1.8 1.8 1.7  --   --   --   --   --   --  1.7 1.6* 2.1  --  2.2 1.7 1.7  --   --   --  1.8  --  1.7  --   --   PHOS 2.5 3.7 2.6  --   --   --  4.3 4.0  --  3.9 2.6 3.0  --  3.3 1.8* 2.1*  --   --   --  3.4 3.5 2.6 3.9  --   ALBUMIN 2.3* 2.2* 2.2*  --  4.4  --  3.5 3.3*  --   --   --   --   --   --   --   --   --   --   --  2.1* 2.1*  --  1.9* 2.1*   < > = values in this interval not displayed.     Liver Function Tests: Recent Labs  Lab 12/24/21 1231 12/26/21 1435 12/27/21 0212  AST  --   --  84*  ALT  --   --  82*  ALKPHOS  --   --  478*  BILITOT   --   --  2.8*  PROT  --   --  5.9*  ALBUMIN 2.1* 1.9* 2.1*   No results for input(s): "LIPASE", "AMYLASE" in the last 168 hours. No results for input(s): "AMMONIA" in the last 168 hours. CBC: Recent Labs    06/20/21 0434 06/20/21 0845 10/13/21 0517 10/13/21 1359 10/14/21 0744 12/23/21 0039 12/24/21 1231 12/25/21 0355 12/26/21 1435 12/27/21 0212 12/27/21 0925  HGB  --    < > 8.4*  --    < > 8.4* 8.4* 7.5* 6.7* 8.4*  --   MCV  --    < > 101.9*  --    < > 95.9 99.6 99.2 99.0 95.3  --   VITAMINB12  --   --  1,598*  --   --   --   --   --   --   --   --   FOLATE  --   --  >40.0  --   --   --   --   --   --   --   --   FERRITIN 1,008*  --   --  1,582*  --   --   --   --   --   --  3,059*  TIBC 144*  --   --  131*  --   --   --   --   --   --  172*  IRON 40  --   --  39  --   --   --   --   --   --  66   < > = values in this interval not displayed.    Cardiac Enzymes: No results for input(s): "CKTOTAL", "CKMB", "CKMBINDEX", "TROPONINI" in the last 168 hours. CBG: Recent Labs  Lab 12/28/21 1543 12/28/21 1929 12/28/21 2327 12/29/21 0303 12/29/21 0727  GLUCAP 118* 171* 202* 179* 101*    Iron Studies:  Recent Labs    12/27/21 0925  IRON 66  TIBC 172*  FERRITIN 3,059*   Studies/Results: No results found.  Medications: Infusions:  sodium chloride Stopped (12/23/21 2244)   feeding supplement (VITAL AF 1.2 CAL) 55 mL/hr at 12/29/21 0600    Scheduled Medications:  sodium chloride   Intravenous Once   amoxicillin-clavulanate  500 mg Per Tube Q12H   Chlorhexidine Gluconate Cloth  6 each Topical Daily   famotidine  10 mg Per Tube Daily   folic acid  1 mg Per Tube Q1200   heparin injection (subcutaneous)  5,000 Units Subcutaneous Q12H   insulin aspart  0-9 Units Subcutaneous Q4H   lacosamide  150 mg Per Tube BID   levETIRAcetam  500 mg Per Tube BID   multivitamin  1 tablet Per Tube QHS   mouth rinse  15 mL Mouth Rinse 4 times per day   phenytoin  100 mg Per Tube Q8H    sodium chloride flush  10-40 mL Intracatheter Q12H   vitamin E  400 Units Per Tube Daily    have reviewed scheduled and prn medications.  Physical Exam: General:NAD, comfortable, confused female Heart:RRR, s1s2 nl Lungs:clear b/l, no crackle Abdomen:soft, Non-tender, non-distended Extremities:No edema Dialysis Access: Left upper extremity AV graft has dressing on, no bleeding noted.  Erica [name] 12/29/2021,11:14 AM  LOS: 14 days

## 2021-12-29 NOTE — Consult Note (Signed)
[place] Nurse wound follow up Wound type:sacaral Unstageable PI measuring 7cm x 5cm with 40% nonviable tissue in base to left buttock. Right buttock with 3cm x 2cm Unsatageable PI.  NEW: left ear with 3cm x 0.5cm area of eschar noted. Unstageable PI. Measurement:As noted above Wound bed:AS described above Drainage (amount, consistency, odor) Scant serous from left buttock. None from right buttock, none from left ear Periwound:intact, dry Dressing procedure/placement/frequency: Xeroform gauze to bilateral buttock wounds. Change daily. The left ear is to be painted with a povidone-iodine swabstick and allowed to air dry. When dry, this is to be covered with aq silicone foam.  [place] nursing team will follow, seeing every 7-10 days and will remain available to this patient, the nursing and medical teams.    Thank you for inviting [place] to participate in this patient's Plan of Care.  Maudie Flakes, MSN, RN, CNS, [place], Serita Grammes, [employer], [employer] phone:  [phone]

## 2021-12-29 NOTE — Progress Notes (Signed)
Attending:   Seen and examined independently, discussed assessment and formulated care plan with NP Eric Form.  Subjective: 66 y/o female, prolonged hospitalization. Initially elective EGD canceled, then code stroke> tpa> fistula bleeding. No evidence of stroke, but has seizure. Eventually intubated for respiratory failure, extubated 11/11.  More tachypneic this morning  Objective: Vitals:   12/29/21 0800 12/29/21 0900 12/29/21 1000 12/29/21 1149  BP: (!) 155/69 (!) 165/84 (!) 162/74   Pulse: 95 99 94   Resp: (!) 32 (!) 38 (!) 32   Temp:    (!) 100.8 F (38.2 C)  TempSrc:    Oral  SpO2: 93% 95% 93%   Weight:      Height:          Intake/Output Summary (Last 24 hours) at 12/29/2021 1243 Last data filed at 12/29/2021 0945 Gross per 24 hour  Intake 1470 ml  Output --  Net 1470 ml    General:  increased work of breathing in bed HENT: NCAT OP clear PULM: Crackles bases B, normal effort CV: tachy, regular, JVD to angle of jaw GI: BS+, soft, nontender MSK: normal bulk and tone Neuro: awake, non verbal, looks at me when I say her name     CBC    Component Value Date/Time   WBC 15.1 (H) 12/27/2021 0212   RBC 2.77 (L) 12/27/2021 0212   HGB 8.4 (L) 12/27/2021 0212   HCT 26.4 (L) 12/27/2021 0212   PLT 192 12/27/2021 0212   MCV 95.3 12/27/2021 0212   MCH 30.3 12/27/2021 0212   MCHC 31.8 12/27/2021 0212   RDW 17.5 (H) 12/27/2021 0212   LYMPHSABS 1.0 12/27/2021 0212   MONOABS 1.0 12/27/2021 0212   EOSABS 0.2 12/27/2021 0212   BASOSABS 0.0 12/27/2021 0212    BMET    Component Value Date/Time   NA 132 (L) 12/27/2021 0212   K 4.0 12/27/2021 0212   CL 94 (L) 12/27/2021 0212   CO2 25 12/27/2021 0212   GLUCOSE 193 (H) 12/27/2021 0212   BUN 32 (H) 12/27/2021 0212   CREATININE 1.69 (H) 12/27/2021 0212   CALCIUM 8.6 (L) 12/27/2021 0212   GFRNONAA 33 (L) 12/27/2021 0212   GFRAA 15 (L) 11/18/2019 0742    CXR images 11/13 reviewed> hazy infilrate R lung throughout,  low lung volumes  Impression/Plan: Acute respiratory failure with hypoxemia due to pulmonary edema> NPO, ask renal to remove volume with HD sooner rather than later, start oxygen ESRD> as above Seizure > keppra, vimpat, dilantin, prn ativan Fever, concern for aspiration pneumonia> continue augmentin, npo  Goals of care> continue to engage family as overall prognosis is very poor  Rest per NP note.   My cc time 22 minutes  Roselie Awkward, MD [place] PCCM Pager: [phone] Cell: [phone] After 7pm: [phone]

## 2021-12-29 NOTE — Progress Notes (Signed)
Patient ID: Erica [name], female   DOB: 17-Sep-1955, 66 y.o.   MRN: [id number]    Progress Note from the Palliative Medicine Team at [facility]   Patient Name: Erica [name]        Date: 12/29/2021 DOB: 12-08-1955  Age: 66 y.o. MRN#: [id number] Attending Physician: Laurin Coder, MD Primary Care Physician: System, Provider Not In Admit Date: 12/15/2021   Medical records reviewed, assessed the patient and discussed with treatment team  66 y.o. female  with past medical history of seizure disorder, ESRD, HTN, DM2, COPD, dCHF, gastroparesis, gerd, heart murmur, HLD, kidney stones, neuropathic pain/neuropathy, and depression admitted on 12/15/2021 with unresponsiveness and status epilepticus.   Significant Hospital events: 10/30 - CTA Head/Neck 1. No emergent large vessel occlusion or proximal hemodynamically significant stenosis. 2. Normal CT perfusion. 10/31 - R fem HD cath, EEG suggestive of moderate to severe diffuse encephalopathy, nonspecific etiology. No seizures or epileptiform discharges were seen throughout the recording. MRI Brain no evidence for acute stroke, left cortical ribbon, question consistent with seizure 11/4 - No seizures overnight. Plan to wean off propofol today and continue home AEDs.  11/5 - No seizures overnight. Hyponatremic (124) on morning BMP. 11/6 - Failed PSV/wean due to low Vt. Remains off of sedation since 11/4, minimally responsive. LTM EEG ongoing. 11/7 - Tolerating PSV 10/5. LTM EEG off.  MRI showed increased cortical signal consistent with recent seizure 11/7 MRI-IMPRESSION:1. Increased cortical signal on diffusion-weighted sequence and T2 weighted sequences in the left frontoparietal region, new compared to 12/16/2021, favored to represent sequela of a recent seizure, given the distribution. 11-11-  successfully extubated today, following simple commands  Tachypnea,  failed swallow study--     This NP assessed patient at the bedside for  palliative  medicine needs and emotional support.  Patient is lethargic, she is able to follow simple commands.   Unfortunately patient's son [name] did not show for scheduled meeting.  I was able to call and speak to him by phone along with his fiance Langley Gauss for continued conversation regarding current medical situation.  Education offered on patient's current medical condition specific to her multiple comorbidities; end-stage renal disease,  COPD, CHF, continued , physical functional and cognitive decline, and now seizure.  Education offered on concept specific to adult failure to thrive and the limitations of medical interventions to prolong quality of life when the body fails to thrive.  Education offered on concept specific to advance care planning; CODE STATUS, artificial feeding and hydration.    Detailed education offered today on patient's failed swallow study, and again her dependence on hemodialysis.  I expressed my worry for the patient's continued failure to thrive, and the limitations of medical interventions to prolong quality of life when the body does fail to thrive.  We explored what quality of life would need for [name].  Patient's fiance Langley Gauss was very insightful and asked relevant questions as it relates to continued aggressive life prolonging  interventions specific to intubation and feeding tubes, as it relates to quality of life.  Patient's son clearly looks to [facility] for guidance and decisions regarding  his mother's care.  It will be important to include her in ongoing goals of care discussions.  Education offered today regarding  the importance of continued conversation with family and their  medical providers regarding overall plan of care and treatment options,  ensuring decisions are within the context of the patients values and GOCs.   Plan of care -  Full code     -Educated patient/family to consider DNR/DNI status understanding evidenced based  poor outcomes in similar hospitalized patient, as the cause of arrest is likely associated with advanced chronic illness rather than an easily reversible acute cardio-pulmonary event. -Family verbalized decision for all offered and available medical interventions to prolong life.   Questions and concerns addressed   Discussed with Dr Lars Mage and bedside RN  I let family and the attending know that I will be out of the hospital until next Monday.  Another palliative provider is available for questions and concerns.  Call team phone.   70 minutes    50% of the time was spent in counseling coordination of care.   Wadie Lessen NP  Palliative Medicine Team Team Phone # [phone]

## 2021-12-29 NOTE — Progress Notes (Signed)
   12/29/21 2250  Vitals  Temp 98.6 F (37 C)  Temp Source Axillary  BP (!) 178/140  MAP (mmHg) 149  BP Location Right Arm  BP Method Automatic  Patient Position (if appropriate) Lying  Pulse Rate 100  Pulse Rate Source Monitor  ECG Heart Rate (!) 101  Resp (!) 31  Oxygen Therapy  SpO2 100 %  O2 Device Nasal Cannula  O2 Flow Rate (L/min) 2 L/min  Patient Activity (if Appropriate) In bed  Pulse Oximetry Type Continuous  During Treatment Monitoring  Blood Flow Rate (mL/min) 200 mL/min  HD Safety Checks Performed Yes  Intra-Hemodialysis Comments Tx completed  Post Treatment  Dialyzer Clearance Lightly streaked  Duration of HD Treatment -hour(s) 3.75 hour(s)  Hemodialysis Intake (mL) 0 mL  Liters Processed 90  Fluid Removed (mL) 2500 mL  Tolerated HD Treatment Yes  Post-Hemodialysis Comments hd tx completed. no complications.  AVG/AVF Arterial Site Held (minutes) 20 minutes  AVG/AVF Venous Site Held (minutes) 20 minutes  Fistula / Graft Left Upper arm Arteriovenous vein graft  Placement Date/Time: 11/04/21 1405   Placed prior to admission: Yes  Orientation: Left  Access Location: Upper arm  Access Type: (c) Arteriovenous vein graft  Site Condition No complications  Fistula / Graft Assessment Present;Thrill;Bruit  Status Deaccessed  Needle Size 15  Drainage Description None

## 2021-12-29 NOTE — Progress Notes (Addendum)
NAME:  Erica [name], MRN:  [id number], DOB:  08-09-1955, LOS: 59 ADMISSION DATE:  12/15/2021, CONSULTATION DATE:  12/19/2021 REFERRING MD:   Ova Freshwater - EDP CHIEF COMPLAINT:  Altered Mental Status    History of Present Illness:  66 year old woman who presented to [place] from [facility] for scheduled EGD; this was cancelled as chewing tobacco was found in patient's mouth at the time of assessment. PMHx significant for HTN, HLD, CHF, seizure disorder (on Keppra), T2DM with peripheral neuropathy, ESRD on HD (TTS via LUE AVF).  Shortly after this, patient had L-sided gaze deviation with concomitant R-sided weakness and concern for stroke. Code Stroke was called. CT Head NAICA, TNK was administered at 1349. CTA Head/Neck negative for LVO. While in ED, post-TNK administration patient's LUE AVF dialysis access began to bleed. Pressure was applied and clamps placed. Patient was transferred to [facility] for higher level of care.    PCCM ground team called to ED to reassess patient after [facility] ED arrival. On arrival, patient reportedly had expanding hematoma at LUE AVF access site. [facility] ED RN applied circumferential pressure which allowed for hemostasis. Dr. Virl Cagey ([facility]) was present at bedside; fortunately no urgent surgical intervention was needed. Ace bandage x 2 were applied for compression and the access site remained hemostatic. Due to recent TNK administration and active bleeding, temporary dialysis access was recommended (discussed with son, [name], and consent obtained via phone - please see procedure note dated 10/31AM).  Pertinent Medical History:  ESRD on HD, left arm AV fistula Hypertension Seizure disorder, on Keppra Diabetes type 2, on insulin  Significant Hospital Events: Including procedures, antibiotic start and stop dates in addition to other pertinent events   10/30 - CTA Head/Neck 1. No emergent large vessel occlusion or proximal hemodynamically significant stenosis. 2. Normal CT  perfusion. 10/31 - R fem HD cath, EEG suggestive of moderate to severe diffuse encephalopathy, nonspecific etiology. No seizures or epileptiform discharges were seen throughout the recording. MRI Brain no evidence for acute stroke, left cortical ribbon, question consistent with seizure 11/4 - No seizures overnight. Plan to wean off propofol today and continue home AEDs.  11/5 - No seizures overnight. Hyponatremic (124) on morning BMP. 11/6 - Failed PSV/wean due to low Vt. Remains off of sedation since 11/4, minimally responsive. LTM EEG ongoing. 11/7 - Tolerating PSV 10/5. LTM EEG off.  MRI showed increased cortical signal consistent with recent seizure 11/7 MRI-IMPRESSION:1. Increased cortical signal on diffusion-weighted sequence and T2 weighted sequences in the left frontoparietal region, new compared to 12/16/2021, favored to represent sequela of a recent seizure, given the distribution. 11/11 extubated  11/13, tachypnea , Palliative care was supposed to meet with son, he was a no show.    Interim History / Subjective:   Remains critically ill, extubated 11/11 Arouses easily, follows commands Very weak, tachypnea today>> + 12.1 L and + JVD Swallow worse today than 11/12 per speech, ,maintain NPO and TF for now Concern for aspiration >> T Max 100.6 Message from Social Work that patient meets criteria for LTACH  Labs  WBC 15.4, HGB 8.3, Platelets 281 BMET and Mag in process CXR 11/13 with no acute findings , low lung volumes post extubation     Objective:  Blood pressure (!) 162/74, pulse 94, temperature (!) 100.8 F (38.2 C), temperature source Oral, resp. rate (!) 32, height 4\' 8"  (1.422 m), weight 51.9 kg, SpO2 93 %.        Intake/Output Summary (Last 24 hours) at 12/29/2021  [place] filed at 12/29/2021 0945 Gross per 24 hour  Intake 1470 ml  Output --  Net 1470 ml   Filed Weights   12/26/21 1424 12/27/21 0417 12/28/21 0500  Weight: 52.2 kg 52.9 kg 51.9 kg    Physical Examination: General: Chronically ill-appearing, female, mild tachypnea HEENT: Temporal wasting, No LAD, ++ JVD, MM pink and dry Neuro: Very weak, follows commands, weak phonation . Move all extremities, but R>L CV: S1-S2 appreciated , No RMG PULM: Bilateral chest excursion, respirations rapid at 30, few rhonchi and diminished per bases GI: Bowel sounds appreciated Extremities: Bilateral upper extremity edema, LUE HD fistula + bruit and thrill Skin: Warm/dry, no rashes.    Resolved Hospital Problem List:     Assessment & Plan:   Nonconvulsive status epilepticus Metabolic encephalopathy History of epilepsy/seizure disorder -Antiepileptics including Keppra Vimpat phenytoin -Ativan for breakthrough seizures -[name]  Acute hypoxic respiratory failure Was able to extubate 11/11 Tachypnea 11/13 >> + 12 L  - Continue oxygen supplementation - Maintain saturation goals > 94% - CXR now and prn  - Mag now  End-stage renal disease on hemodialysis -Left upper extremity HD access - Trend BMET - Replete electrolytes - HD today 11/13   Fever/leukocytosis -Did have 3 days of vancomycin/Zosyn -Switched over to [place] able to obtain IV access at present -Respiratory cultures not showing any organisms -Blood cultures no organism -Monitor closely  Anemia secondary to blood loss -Trend CBC - Transfuse for HGB < 7  Type 2 diabetes Hypoglycemia -Continue to monitor blood sugar - SSI  Severe malnutrition - Continue enteral supplementation per [place] - TF only as swallow has declined last 24 hours per speech  Difficult IV access -IV team to attempt access  Appreciate palliative care involvement -Meeting scheduled with family on 12/29/2021>> son was a no show   CC APP Time 35 minutes   Best Practice: (right click and "Reselect all SmartList Selections" daily)   Diet/type: tubefeeds DVT prophylaxis:  SCD GI prophylaxis: H2B Lines: Dialysis Catheter and yes and it is still needed Foley:  N/A Code Status:  full code Last date of multidisciplinary goals of care discussion: Son updated via phone 11/7.  PMT following  The patient is critically ill with multiple organ systems failure and requires high complexity decision making for assessment and support, frequent evaluation and titration of therapies, application of advanced monitoring technologies and extensive interpretation of multiple databases. Critical Care Time devoted to patient care services described in this note independent of APP/resident time (if applicable)  is 35 minutes.   Magdalen Spatz, MSN, AGACNP-BC Ali Molina for personal pager PCCM on call pager [phone]  12/29/2021 12:50 PM

## 2021-12-29 NOTE — Progress Notes (Signed)
Nutrition Follow-up  DOCUMENTATION CODES:   Severe malnutrition in context of chronic illness  INTERVENTION:   Continue tube feeds via Cortrak: - Change to Peptamen 1.5 @ 45 ml/hr (1080 ml/day) - PROSource TF20 60 ml daily  Tube feeding regimen provides 1700 kcal, 93 grams of protein, and 829 ml of H2O.  - Continue renal MVI daily per tube  - Continue vitamin E solution 400 units daily per tube x 14 days for vitamin E deficiency; recommend recheck vitamin E lab after 14 days  - Add 1 packet Juven BID per tube, each packet provides 95 calories, 2.5 grams of protein, and 9.8 grams of carbohydrate; also contains L-arginine and L-glutamine, vitamin C, vitamin E, vitamin B-12, zinc, calcium, and calcium Beta-hydroxy-Beta-methylbutyrate to support wound healing  - Awaiting results of zinc lab  NUTRITION DIAGNOSIS:   Severe Malnutrition related to chronic illness (ESRD, chronic pancreatitis, CHF) as evidenced by severe fat depletion, severe muscle depletion.  Ongoing, being addressed via TF  GOAL:   Patient will meet greater than or equal to 90% of their needs  Met via TF  MONITOR:   Vent status, Labs, Weight trends, TF tolerance, Skin, I & O's  REASON FOR ASSESSMENT:   Consult Enteral/tube feeding initiation and management  ASSESSMENT:   66 year old female who presented to the ED from SNF on 10/30 as a Code Stroke. PMH of ESRD on HD, gastroparesis, chronic pancreatitis, T2DM, CHF, HTN, HLD, neuropathy, seizures. Pt received TNK.  11/01 - Cortrak placed (tip gastric) 11/02 - TF changed to Vital AF 1.2, intubated for burst suppression 11/11 - extubated  Discussed pt with RN and during ICU rounds. Pt tolerating tube feeds via Cortrak. Pt remains NPO. Palliative Medicine following.  Pt with mild pitting edema to BUE and BLE and is ~11 kg above EDW. RD to adjust TF regiment to provide less volume. Will also add Juven to aid in wound healing. Pt with new unstageable pressure  injuries to bilateral buttocks and L ear per WOC note.  Admit weight: 49.4 kg Current weight: 51.9 kg EDW: 41 kg  Current TF: Vital AF 1.2 @ 55 ml/hr  Medications reviewed and include: aranesp, pepcid, folic acid, SSI q 4 hours, rena-vit, dilantin per tube q 8 hours, vitamin E 400 units daily x 14 days  Vitamin/Mineral Profile: Thiamine B1: 131.2 (WNL) Vitamin A: 29.1 (WNL) Vitamin D: 37.28 (WNL) Vitamin E (alpha tocopherol): 7.2 (low) Vitamin K: <0.10 (low) Vitamin C: 1.6 (WNL) Zinc: pending CRP: 11.5 (high)  Labs reviewed. CBG's: 101-202 x 24 hours  Diet Order:   Diet Order             Diet NPO time specified  Diet effective now                   EDUCATION NEEDS:   Not appropriate for education at this time  Skin:  Skin Assessment: Skin Integrity Issues: Unstageable: R buttock, L buttock, L ear (per WOC note)  Last BM:  12/29/21 small type 5  Height:   Ht Readings from Last 1 Encounters:  12/18/21 _0  (1.422 m)    Weight:   Wt Readings from Last 1 Encounters:  12/28/21 51.9 kg    BMI:  Body mass index is 25.65 kg/m.  Estimated Nutritional Needs:   Kcal:  1600-1800  Protein:  80-95 grams  Fluid:  1000 ml + UOP    Gustavus Bryant, MS, RD, LDN Inpatient Clinical Dietitian Please see AMiON for contact information.

## 2021-12-29 NOTE — TOC Progression Note (Signed)
Transition of Care [facility]) - Initial/Assessment Note    Patient Details  Name: Erica [name] MRN: [id number] Date of Birth: August 17, 1955  [facility]) CM/SW Contact:    Milinda Antis, [place] Phone Number: 12/29/2021, 12:04 PM  Clinical Narrative:                 TOC following patient for d/c planning needs.   Patient is from [facility] (SNF) and can return when medically ready.  LCSW messaged attending and inquired about possible [facility] consult and is awaiting a response.     Lind Covert, MSW, LCSW  Patient Goals and CMS Choice        Expected Discharge Plan and Services                                                Prior Living Arrangements/Services                       Activities of Daily Living      Permission Sought/Granted                  Emotional Assessment              Admission diagnosis:  Altered mental status [R41.82] Patient Active Problem List   Diagnosis Date Noted   Status epilepticus ([place]) 12/18/2021   Esophageal dysphagia 11/10/2021   Poor appetite 11/10/2021   H/O adenomatous polyp of colon 11/10/2021   Benign essential HTN 10/17/2021   Macrocytic anemia 10/13/2021   GI bleed 10/13/2021   Elevated troponin 10/13/2021   GERD (gastroesophageal reflux disease) 10/13/2021   Colitis 10/12/2021   Atrial fibrillation ([place])    Hypothermia 06/19/2021   Abnormal EKG 06/19/2021   Right hip pain    CAD (coronary artery disease) 09/17/2020   Altered mental status    Acute metabolic encephalopathy 49/70/2637   Diarrhea    Pressure injury of skin 06/09/2020   Sacral fracture ([place]) 06/08/2020   Thrombocytopenia ([place]) 06/08/2020   Hypertensive urgency 06/08/2020   (HFpEF) heart failure with preserved ejection fraction ([place]) 06/08/2020   Fall 06/08/2020   Elevated MCV 06/08/2020   Palliative care by specialist    Generalized weakness 11/14/2019   HCAP (healthcare-associated pneumonia) 11/14/2019    Hyperkalemia 11/13/2019   Hyponatremia 09/27/2019   Closed fracture of right proximal humerus 04/20/2019   Surgery, elective    Seizure ([place]) 11/12/2018   History of hydronephrosis --stents in place 12/08/2017   Stroke ([place]) 11/29/2017   Seizures ([place])    Hydronephrosis    Recurrent UTI    Diabetes mellitus type 2 in nonobese ([place])    ESRD (end stage renal disease) ([place])    Anemia    Chronic diastolic congestive heart failure ([place])    Essential hypertension    PICC (peripherally inserted central catheter) in place    Acute pyelonephritis 11/12/2017   Uncontrolled type 2 diabetes mellitus with hyperglycemia, with long-term current use of insulin ([place]) 11/10/2017   Renal failure 11/08/2017   Seizure disorder ([place]) 11/08/2017   Orthostatic hypotension 07/25/2014   Moderate protein malnutrition ([place]) 09/15/2013   Aspiration pneumonia ([place]) 09/15/2013   Acute respiratory failure requiring reintubation ([place]) 09/11/2013   probable Seizures due to metabolic disorder 85/88/5027   Lactic acidosis 03/19/2013   Abdominal pain 03/19/2013   Rotavirus  infection 10/29/2012   Diabetes mellitus with end-stage renal disease ([place]) 10/29/2012   Protein-calorie malnutrition, severe ([place]) 10/27/2012   NSTEMI (non-ST elevated myocardial infarction) ([place]) 10/26/2012   Fever, unspecified 10/26/2012   Hypotension 16/57/9038   Metabolic acidosis 33/38/3291   Chronic diarrhea 10/25/2012   Tobacco abuse 10/25/2012   DKA (diabetic ketoacidoses) 09/09/2012   Dehydration 09/09/2012   DKA, type 2 ([place]) 05/20/2012   Abnormal LFTs 05/20/2012   Heart murmur, systolic 91/66/0600   Hypoglycemia 45/99/7741   Metabolic encephalopathy 42/39/5320   Alcohol abuse 07/08/2011   Hypokalemia 07/08/2011   Nausea & vomiting 07/08/2011   H/O chronic pancreatitis 07/08/2011   PCP:  System, Provider Not In Pharmacy:   [place], [place]  [address] Phone: [phone] Fax: [place], [place] - [address] [address] Phone: [phone] Fax: [place], [place] - [address] [place] [address] Phone: [phone] Fax: [phone]     Social Determinants of Health (SDOH) Interventions    Readmission Risk Interventions    08/20/2020   11:24 AM 06/11/2020   12:31 PM 11/19/2019   11:36 AM  Readmission Risk Prevention Plan  Transportation Screening Complete Complete Complete  Medication Review (RN Care Manager) Complete Complete Complete  PCP or Specialist appointment within 3-5 days of discharge Complete  Complete  HRI or Home Care Consult Complete Complete Complete  SW Recovery Care/Counseling Consult Complete Complete Complete  Palliative Care Screening Not Applicable Not Applicable Complete  Skilled Nursing Facility Complete Complete Complete

## 2021-12-29 NOTE — Progress Notes (Signed)
Speech Language Pathology Treatment: Dysphagia  Patient Details Name: Erica [name] MRN: [id number] DOB: [birth year] Today's Date: 12/29/2021 Time: 8185-6314 SLP Time Calculation (min) (ACUTE ONLY): 6 min  Assessment / Plan / Recommendation Clinical Impression  Pt presents with worsening swallow function as compared to clinical assessment 11/12.  Vocal quality is seemingly improving.  Pt with hoarse vocal quality, but able to achieve phonation which is improved from yesterday; however, pharyngeal swallow function is seemingly declining.  Pt was reluctant to accept PO trials.  Pt pursed lips in response to thermal stimulation.  Pt given very small amount of water by spoon.  There was some anterior loss. Oral holding/delayed oral transit suspected. Laryngeal elevation was significantly reduced and it is unclear if a pharyngeal swallow reflex was initiated on first trial.  There was immediate weak prolonged cough with desaturation to 82.  With 2nd trial of small amount of water by spoon there was no appreciable attempt to swallow.  Suction was used to clear oral cavity.  No further trials given today 2/2 severity of dysphagia symptoms.  Pt is not appropriate for instrumental assessment at this time. As compared to yesterday's evaluation with presenting symptoms c/w reversible post intubation dysphagia, question whether there is another component to dysphagia or a possible fluctuation in neuro status.    Recommend pt remain NPO with alternate means of nutrition, hydration, and medication.     HPI HPI: 66 y.o. female  with past medical history of seizure disorder, ESRD, HTN, DM2, COPD, dCHF, gastroparesis, gerd, heart murmur, HLD, kidney stones, neuropathic pain/neuropathy, and depression admitted on 12/15/2021 with unresponsiveness and status epilepticus.      SLP Plan  Continue with current plan of care      Recommendations for follow up therapy are one component of a multi-disciplinary  discharge planning process, led by the attending physician.  Recommendations may be updated based on patient status, additional functional criteria and insurance authorization.    Recommendations  Diet recommendations: NPO Medication Administration: Via alternative means                Oral Care Recommendations: Oral care QID Follow Up Recommendations: Skilled nursing-short term rehab (<3 hours/day) Assistance recommended at discharge: Frequent or constant Supervision/Assistance SLP Visit Diagnosis: Dysphagia, oropharyngeal phase (R13.12) Plan: Continue with current plan of care           Celedonio Savage, [place], [place] Office: [phone] 12/29/2021, 10:11 AM

## 2021-12-29 NOTE — Progress Notes (Signed)
Labs reviewed.  Mag 1.9, will give 1 gram bolus K 5.9>, BUN 83>> Creatinine 3.42. Patient getting HD today Gap has gone from 13 to 21 over night  CO2 has gone from 25 to 72 Will check a lactic acid. >> Hopefully HD will help this also ? Source Will attempt Blood cultures x 2  Magdalen Spatz, MSN, AGACNP-BC [place] for personal pager PCCM on call pager [phone]  12/29/2021 3:40 PM

## 2021-12-29 NOTE — Progress Notes (Signed)
[facility] PCCM  Remains tachypneic but no accessory muscle use or paradoxical breathing Answers some questions but remains confused Hemodialysis pending Lactic acid and ABG re-assuring Continue to monitor closely, HD ASAP  Roselie Awkward, MD [place] PCCM Pager: [phone] Cell: [phone] After 7:00 pm call Elink  [phone]

## 2021-12-30 ENCOUNTER — Inpatient Hospital Stay (HOSPITAL_COMMUNITY): Payer: Medicare Other

## 2021-12-30 ENCOUNTER — Encounter (HOSPITAL_COMMUNITY): Payer: Medicare Other

## 2021-12-30 DIAGNOSIS — J181 Lobar pneumonia, unspecified organism: Secondary | ICD-10-CM | POA: Diagnosis not present

## 2021-12-30 DIAGNOSIS — N186 End stage renal disease: Secondary | ICD-10-CM | POA: Diagnosis not present

## 2021-12-30 DIAGNOSIS — G40901 Epilepsy, unspecified, not intractable, with status epilepticus: Secondary | ICD-10-CM | POA: Diagnosis not present

## 2021-12-30 DIAGNOSIS — M7989 Other specified soft tissue disorders: Secondary | ICD-10-CM

## 2021-12-30 LAB — BASIC METABOLIC PANEL
Anion gap: 18 — ABNORMAL HIGH (ref 5–15)
BUN: 46 mg/dL — ABNORMAL HIGH (ref 8–23)
CO2: 26 mmol/L (ref 22–32)
Calcium: 9 mg/dL (ref 8.9–10.3)
Chloride: 90 mmol/L — ABNORMAL LOW (ref 98–111)
Creatinine, Ser: 2.02 mg/dL — ABNORMAL HIGH (ref 0.44–1.00)
GFR, Estimated: 27 mL/min — ABNORMAL LOW (ref >60)
Glucose, Bld: 71 mg/dL (ref 70–99)
Potassium: 4.2 mmol/L (ref 3.5–5.1)
Sodium: 134 mmol/L — ABNORMAL LOW (ref 135–145)

## 2021-12-30 LAB — CBC WITH DIFFERENTIAL/PLATELET
Abs Immature Granulocytes: 0.08 10*3/uL — ABNORMAL HIGH (ref 0.00–0.07)
Basophils Absolute: 0.1 10*3/uL (ref 0.0–0.1)
Basophils Relative: 0 %
Eosinophils Absolute: 0.2 10*3/uL (ref 0.0–0.5)
Eosinophils Relative: 2 %
HCT: 23.9 % — ABNORMAL LOW (ref 36.0–46.0)
Hemoglobin: 8.1 g/dL — ABNORMAL LOW (ref 12.0–15.0)
Immature Granulocytes: 1 %
Lymphocytes Relative: 8 %
Lymphs Abs: 1.1 10*3/uL (ref 0.7–4.0)
MCH: 31.4 pg (ref 26.0–34.0)
MCHC: 33.9 g/dL (ref 30.0–36.0)
MCV: 92.6 fL (ref 80.0–100.0)
Monocytes Absolute: 1.6 10*3/uL — ABNORMAL HIGH (ref 0.1–1.0)
Monocytes Relative: 11 %
Neutro Abs: 11.5 10*3/uL — ABNORMAL HIGH (ref 1.7–7.7)
Neutrophils Relative %: 78 %
Platelets: 279 10*3/uL (ref 150–400)
RBC: 2.58 MIL/uL — ABNORMAL LOW (ref 3.87–5.11)
RDW: 19.1 % — ABNORMAL HIGH (ref 11.5–15.5)
WBC: 14.5 10*3/uL — ABNORMAL HIGH (ref 4.0–10.5)
nRBC: 0 % (ref 0.0–0.2)

## 2021-12-30 LAB — GLUCOSE, CAPILLARY
Glucose-Capillary: 120 mg/dL — ABNORMAL HIGH (ref 70–99)
Glucose-Capillary: 133 mg/dL — ABNORMAL HIGH (ref 70–99)
Glucose-Capillary: 146 mg/dL — ABNORMAL HIGH (ref 70–99)
Glucose-Capillary: 148 mg/dL — ABNORMAL HIGH (ref 70–99)
Glucose-Capillary: 213 mg/dL — ABNORMAL HIGH (ref 70–99)
Glucose-Capillary: 250 mg/dL — ABNORMAL HIGH (ref 70–99)
Glucose-Capillary: 251 mg/dL — ABNORMAL HIGH (ref 70–99)
Glucose-Capillary: 69 mg/dL — ABNORMAL LOW (ref 70–99)

## 2021-12-30 LAB — CULTURE, BLOOD (ROUTINE X 2)
Culture: NO GROWTH
Culture: NO GROWTH
Special Requests: ADEQUATE
Special Requests: ADEQUATE

## 2021-12-30 MED ORDER — CHLORHEXIDINE GLUCONATE CLOTH 2 % EX PADS
6.0000 | MEDICATED_PAD | Freq: Every day | CUTANEOUS | Status: DC
  Administered 2021-12-31: 6 via TOPICAL

## 2021-12-30 MED ORDER — DEXTROSE 50 % IV SOLN
12.5000 g | INTRAVENOUS | Status: AC
  Administered 2021-12-30: 12.5 g via INTRAVENOUS
  Filled 2021-12-30: qty 50

## 2021-12-30 MED ORDER — INSULIN ASPART 100 UNIT/ML IJ SOLN
0.0000 [IU] | INTRAMUSCULAR | Status: DC
  Administered 2021-12-30: 3 [IU] via SUBCUTANEOUS
  Administered 2021-12-30: 2 [IU] via SUBCUTANEOUS
  Administered 2021-12-31: 3 [IU] via SUBCUTANEOUS

## 2021-12-30 NOTE — Progress Notes (Addendum)
NAME:  Erica [name], MRN:  [id number], DOB:  [birth year], LOS: 101 ADMISSION DATE:  12/15/2021, CONSULTATION DATE:  12/19/2021 REFERRING MD:   Ova Freshwater - EDP CHIEF COMPLAINT:  Altered Mental Status    History of Present Illness:  66 year old woman who presented to [place] from [facility] SNF for scheduled EGD; this was cancelled as chewing tobacco was found in patient's mouth at the time of assessment. PMHx significant for HTN, HLD, CHF, seizure disorder (on Keppra), T2DM with peripheral neuropathy, ESRD on HD (TTS via LUE AVF).  Shortly after this, patient had L-sided gaze deviation with concomitant R-sided weakness and concern for stroke. Code Stroke was called. CT Head NAICA, TNK was administered at 1349. CTA Head/Neck negative for LVO. While in ED, post-TNK administration patient's LUE AVF dialysis access began to bleed. Pressure was applied and clamps placed. Patient was transferred to [facility] for higher level of care.    PCCM ground team called to ED to reassess patient after [facility] ED arrival. On arrival, patient reportedly had expanding hematoma at LUE AVF access site. [facility] ED RN applied circumferential pressure which allowed for hemostasis. Dr. Virl Cagey ([facility]) was present at bedside; fortunately no urgent surgical intervention was needed. Ace bandage x 2 were applied for compression and the access site remained hemostatic. Due to recent TNK administration and active bleeding, temporary dialysis access was recommended (discussed with son, Erica [name], and consent obtained via phone - please see procedure note dated 10/31AM).  Pertinent Medical History:  ESRD on HD, left arm AV fistula Hypertension Seizure disorder, on Keppra Diabetes type 2, on insulin  Significant Hospital Events: Including procedures, antibiotic start and stop dates in addition to other pertinent events   10/30 - CTA Head/Neck 1. No emergent large vessel occlusion or proximal hemodynamically significant stenosis. 2. Normal CT  perfusion. 10/31 - R fem HD cath, EEG suggestive of moderate to severe diffuse encephalopathy, nonspecific etiology. No seizures or epileptiform discharges were seen throughout the recording. MRI Brain no evidence for acute stroke, left cortical ribbon, question consistent with seizure 11/4 - No seizures overnight. Plan to wean off propofol today and continue home AEDs.  11/5 - No seizures overnight. Hyponatremic (124) on morning BMP. 11/6 - Failed PSV/wean due to low Vt. Remains off of sedation since 11/4, minimally responsive. LTM EEG ongoing. 11/7 - Tolerating PSV 10/5. LTM EEG off.  MRI showed increased cortical signal consistent with recent seizure 11/7 MRI-IMPRESSION:1. Increased cortical signal on diffusion-weighted sequence and T2 weighted sequences in the left frontoparietal region, new compared to 12/16/2021, favored to represent sequela of a recent seizure, given the distribution. 11/11 extubated  11/13, tachypnea , Palliative care was supposed to meet with son, he was a no show.  11/14, stable no overnight events, meets criteria for LTACH, palliative met with family by phone, continue full code and scope of care   Interim History / Subjective:   No overnight events, stable, dialyzed yesterday for worsening respiratory status.  Improved today    Objective:  Blood pressure (!) 162/80, pulse (!) 104, temperature 98.8 F (37.1 C), temperature source Oral, resp. rate (!) 32, height _0  (1.422 m), weight 50.2 kg, SpO2 100 %.        Intake/Output Summary (Last 24 hours) at 12/30/2021 0741 Last data filed at 12/30/2021 0700 Gross per 24 hour  Intake 1074.75 ml  Output 2500 ml  Net -1425.25 ml    Filed Weights   12/29/21 1840 12/29/21 2317 12/30/21 0500  Weight: 57.3 kg 54.8  kg 50.2 kg     General:  chronically ill-appearing F, resting in bed in no acute distress HEENT: MM pink/moist, sclera anicteric Neuro: eyes open, alert, disoriented, nods to questions, does not  follow commands, not moving LUE  CV: s1s2 rrr, no m/r/g PULM:  clear bilaterally on [place] GI: soft, bsx4 active  Extremities: warm/dry, LUE edema noted Skin: no rashes or lesions    Resolved Hospital Problem List:     Assessment & Plan:   Nonconvulsive status epilepticus Metabolic encephalopathy History of epilepsy/seizure disorder EEG 11/2 with con-convulsive status -continue antiepileptics including Keppra Vimpat phenytoin -Ativan for breakthrough seizures -[name]  Acute hypoxic respiratory failure Was able to extubate 11/11 Tachypnea 11/13 >> + 12 L  -improved respiratory status today and down to 2L [place] after 2.5L off - Continue oxygen supplementation - Maintain saturation goals > 94% - CXR now and prn    End-stage renal disease on hemodialysis -Left upper extremity HD access - Trend BMET - Replete electrolytes - last HD 11/13, appreciate nephrology recs, plan for HD 11/15  Fever/leukocytosis Suspected pneumonia, persistent intermittent fevers, but CXR appears improved today -cultures negative thus far -Did have 3 days of vancomycin/Zosyn -Switched over to Augmentin-not able to obtain IV access at present, plan to complete 7 days tomorrow  -Respiratory cultures no growth thus far -Blood cultures no organism -Monitor closely  Anemia secondary to blood loss -Trend CBC - Transfuse for HGB < 7  Type 2 diabetes Hypoglycemia -Continue to monitor blood sugar - SSI, may need to change scale to very sensistive  Severe malnutrition - Continue enteral supplementation per [place] - TF only as swallow has declined last 24 hours per speech  Difficult IV access -IV team to attempt access  LUE edema Has AV fistula -check venous doppler  Appreciate palliative care involvement -Meeting scheduled with family on 12/29/2021>> son was a no show, met via phone, continue full code and scope of care -meets criteria for LTACH,  stable for transfer out of intensive care today     Best Practice: (right click and "Reselect all SmartList Selections" daily)   Diet/type: tubefeeds DVT prophylaxis: SCD GI prophylaxis: H2B Lines: Dialysis Catheter and yes and it is still needed Foley:  N/A Code Status:  full code Last date of multidisciplinary goals of care discussion: Son updated via phone 11/7.  PMT following    Otilio Carpen Artina Minella, PA-C [facility] See Amion for pager If no response to pager , please call [phone] until 7pm After 7:00 pm call Elink  [phone]

## 2021-12-30 NOTE — Progress Notes (Signed)
[facility] NEPHROLOGY PROGRESS NOTE  Assessment/ Plan: Pt is a 66 y.o. yo female with HTN, DM, CHF, seizure disorder, ESRD on HD TTS, admitted with status epilepticus, metabolic encephalopathy.  #Nonconvulsive status epilepticus, per neurology MRI changes are due to prolonged status epilepticus.  Per neurology team.  # ESRD normally TTS at [facility].  Using AV graft for the dialysis.  Status post HD yesterday for respiratory distress with around 2.5 L ultrafiltration.  The chest x-ray with interval decrease in pulmonary vascular congestion.  Respiratory status has improved, plan for next HD tomorrow.  # Anemia: Iron saturation 38 very high ferritin level. Started erythropoietin with HD.  Monitor hemoglobin.  # Secondary hyperparathyroidism: Calcium phosphorus level acceptable.  Currently n.p.o. and not on binders.  # HTN/volume: BP acceptable.  Volume managed with HD.  #Fever/leukocytosis: Currently on Augmentin, per primary team.  Subjective: Seen and examined in ICU.  Received HD yesterday with around 2.5 L ultrafiltration.  Febrile to 101.4 today.  She is alert but mostly confused.  No new event.  Objective Vital signs in last 24 hours: Vitals:   12/30/21 0500 12/30/21 0548 12/30/21 0600 12/30/21 0725  BP: (!) 162/73  (!) 162/80   Pulse: (!) 106  (!) 104   Resp: (!) 31  (!) 32   Temp:  (!) 101.4 F (38.6 C)  98.8 F (37.1 C)  TempSrc:  Oral  Oral  SpO2: 100%  100%   Weight: 50.2 kg     Height:       Weight change:   Intake/Output Summary (Last 24 hours) at 12/30/2021 0844 Last data filed at 12/30/2021 0700 Gross per 24 hour  Intake 1074.75 ml  Output 2500 ml  Net -1425.25 ml        Labs: RENAL PANEL Recent Labs    10/17/21 0444 10/18/21 0528 10/19/21 0502 11/04/21 1218 12/15/21 1325 12/15/21 1349 12/15/21 2140 12/16/21 0212 12/16/21 0905 12/17/21 1630 12/18/21 0358 12/18/21 1428 12/18/21 1645 12/19/21 0339 12/19/21 1835  12/20/21 0508 12/21/21 0210 12/21/21 0512 12/22/21 0314 12/23/21 0039 12/24/21 1231 12/25/21 0327 12/26/21 1435 12/27/21 0212 12/29/21 1256 12/29/21 1749 12/30/21 0616  NA 142 142 140   < > 123*   < > 126* 128*   < >  --  136 136   < > 132*  --  132* 123* 124* 124* 124* 129* 129* 130* 132* 128* 126* 134*  K 2.9* 3.9 3.6   < > 7.2*   < > 7.2* 6.1*   < >  --  2.7* 3.8   < > 3.6  --  3.4* 4.2 4.3 5.0 5.1 4.3 3.6 4.2 4.0 5.9* 6.1* 4.2  CL 108 110 107   < > 89*   < > 88* 89*   < >  --  93* 95*  --  98  --  89* 85* 87* 85* 87* 90* 90* 94* 94* 88*  --  90*  CO2 27 27 28   --  21*   < > 20* 21*   < >  --  28 28  --  24  --  25 24 25 23  21* 24 23 22 25  19*  --  26  GLUCOSE 153* 137* 204*   < > 131*   < > 71 107*   < >  --  140* 148*  --  111*  --  196* 188* 226* 106* 118* 184* 160* 154* 193* 178*  --  71  BUN 14 20 14    < >  55*   < > 55* 57*   < >  --  11 15  --  20  --  15 26* 27* 45* 58* 52* 35* 59* 32* 83*  --  46*  CREATININE 2.60* 3.68* 2.35*   < > 3.79*   < > 4.16* 4.45*   < >  --  1.91* 2.22*  --  2.50*  --  1.80* 2.39* 2.46* 2.96* 3.48* 2.84* 1.85* 2.73* 1.69* 3.42*  --  2.02*  CALCIUM 8.0* 8.1* 7.8*  --  9.8   < > 10.0 10.1   < >  --  9.2 8.8*  --  8.4*  --  8.7* 8.4* 8.6* 8.8* 8.7* 8.8* 8.4* 8.6* 8.6* 9.3  --  9.0  MG 1.8 1.8 1.7  --   --   --   --   --   --  1.7 1.6* 2.1  --  2.2 1.7 1.7  --   --   --  1.8  --  1.7  --   --  1.9  --   --   PHOS 2.5 3.7 2.6  --   --   --  4.3 4.0  --  3.9 2.6 3.0  --  3.3 1.8* 2.1*  --   --   --  3.4 3.5 2.6 3.9  --   --   --   --   ALBUMIN 2.3* 2.2* 2.2*  --  4.4  --  3.5 3.3*  --   --   --   --   --   --   --   --   --   --   --  2.1* 2.1*  --  1.9* 2.1*  --   --   --    < > = values in this interval not displayed.      Liver Function Tests: Recent Labs  Lab 12/24/21 1231 12/26/21 1435 12/27/21 0212  AST  --   --  84*  ALT  --   --  82*  ALKPHOS  --   --  478*  BILITOT  --   --  2.8*  PROT  --   --  5.9*  ALBUMIN 2.1* 1.9* 2.1*    No  results for input(s): "LIPASE", "AMYLASE" in the last 168 hours. No results for input(s): "AMMONIA" in the last 168 hours. CBC: Recent Labs    06/20/21 0434 06/20/21 0845 10/13/21 0517 10/13/21 1359 10/14/21 0744 12/26/21 1435 12/27/21 0212 12/27/21 0925 12/29/21 1256 12/29/21 1749 12/30/21 0616  HGB  --    < > 8.4*  --    < > 6.7* 8.4*  --  8.3* 9.2* 8.1*  MCV  --    < > 101.9*  --    < > 99.0 95.3  --  91.9  --  92.6  VITAMINB12  --   --  1,598*  --   --   --   --   --   --   --   --   FOLATE  --   --  >40.0  --   --   --   --   --   --   --   --   FERRITIN 1,008*  --   --  1,582*  --   --   --  3,059*  --   --   --   TIBC 144*  --   --  131*  --   --   --  172*  --   --   --   IRON 40  --   --  39  --   --   --  66  --   --   --    < > = values in this interval not displayed.     Cardiac Enzymes: No results for input(s): "CKTOTAL", "CKMB", "CKMBINDEX", "TROPONINI" in the last 168 hours. CBG: Recent Labs  Lab 12/29/21 1936 12/29/21 2315 12/30/21 0325 12/30/21 0724 12/30/21 0824  GLUCAP 152* 131* 120* 69* 133*     Iron Studies:  Recent Labs    12/27/21 0925  IRON 66  TIBC 172*  FERRITIN 3,059*    Studies/Results: DG CHEST PORT 1 VIEW  Result Date: 12/30/2021 CLINICAL DATA:  Dyspnea EXAM: PORTABLE CHEST 1 VIEW COMPARISON:  Previous studies including the examination of 12/29/2021 FINDINGS: Transverse diameter of heart is increased. Calcification is seen in the region of mitral annulus. There is interval decrease in pulmonary vascular congestion. There are no signs of alveolar pulmonary edema. There is improvement in aeration of lower lung fields. There is small residual linear density in medial left lower lung field. There is no pleural effusion or pneumothorax. Enteric tube is noted traversing the esophagus. IMPRESSION: Cardiomegaly. There is interval decrease in pulmonary vascular congestion and improvement in aeration in both lungs. Subsegmental atelectasis is  seen in medial left lower lung field. Electronically Signed   By: Elmer Picker M.D.   On: 12/30/2021 08:15   DG CHEST PORT 1 VIEW  Result Date: 12/29/2021 CLINICAL DATA:  Dyspnea ROVER EXAM: PORTABLE CHEST - 1 VIEW COMPARISON:  12/25/2021 FINDINGS: Patient has been extubated. The feeding tube extends at least as far as the stomach, tip not seen. Lower lung volumes with crowding of perihilar bronchovascular structures. No confluent airspace disease or convincing edema. Heart size and mediastinal contours are within normal limits. No effusion. Right humerus orthopedic hardware. IMPRESSION: Low volumes post extubation. No acute findings. Electronically Signed   By: Lucrezia Europe M.D.   On: 12/29/2021 12:57    Medications: Infusions:  sodium chloride Stopped (12/23/21 2244)   feeding supplement (PEPTAMEN 1.5 CAL) 45 mL/hr at 12/30/21 0700    Scheduled Medications:  sodium chloride   Intravenous Once   amoxicillin-clavulanate  500 mg Per Tube Q12H   Chlorhexidine Gluconate Cloth  6 each Topical Daily   [START ON 01/03/2022] darbepoetin (ARANESP) injection - DIALYSIS  60 mcg Subcutaneous Q Sat-1800   famotidine  10 mg Per Tube Daily   feeding supplement (PROSource TF20)  60 mL Per Tube Daily   folic acid  1 mg Per Tube Q1200   heparin injection (subcutaneous)  5,000 Units Subcutaneous Q12H   insulin aspart  0-9 Units Subcutaneous Q4H   lacosamide  150 mg Per Tube BID   levETIRAcetam  500 mg Per Tube BID   multivitamin  1 tablet Per Tube QHS   nutrition supplement (JUVEN)  1 packet Per Tube BID BM   mouth rinse  15 mL Mouth Rinse 4 times per day   phenytoin  100 mg Per Tube Q8H   sodium chloride flush  10-40 mL Intracatheter Q12H   vitamin E  400 Units Per Tube Daily    have reviewed scheduled and prn medications.  Physical Exam: General: Alert but confused female looks comfortable. Heart:RRR, s1s2 nl Lungs:clear b/l, no crackle Abdomen:soft, Non-tender,  non-distended Extremities:No edema Dialysis Access: Left upper extremity AV graft has dressing on, no bleeding noted.  Lavella Myren Erica [name]  Teigan Sahli 12/30/2021,8:44 AM  LOS: 15 days

## 2021-12-30 NOTE — TOC Progression Note (Signed)
Transition of Care [facility]) - Initial/Assessment Note    Patient Details  Name: Erica [name] MRN: [id number] Date of Birth: 1955/12/29  Transition of Care [facility]) CM/SW Contact:    Milinda Antis, [place] Phone Number: 12/30/2021, 11:09 AM  Clinical Narrative:                 LCSW contacted the patient's son to inquire about willingness for the patient to discharge to an LTACH and present choice.  There was no answer.  LCSW left a VM requesting a returned call.          Patient Goals and CMS Choice        Expected Discharge Plan and Services                                                Prior Living Arrangements/Services                       Activities of Daily Living      Permission Sought/Granted                  Emotional Assessment              Admission diagnosis:  Altered mental status [R41.82] Patient Active Problem List   Diagnosis Date Noted   Status epilepticus ([place]) 12/18/2021   Esophageal dysphagia 11/10/2021   Poor appetite 11/10/2021   H/O adenomatous polyp of colon 11/10/2021   Benign essential HTN 10/17/2021   Macrocytic anemia 10/13/2021   GI bleed 10/13/2021   Elevated troponin 10/13/2021   GERD (gastroesophageal reflux disease) 10/13/2021   Colitis 10/12/2021   Atrial fibrillation ([place])    Hypothermia 06/19/2021   Abnormal EKG 06/19/2021   Right hip pain    CAD (coronary artery disease) 09/17/2020   Altered mental status    Acute metabolic encephalopathy 83/38/2505   Diarrhea    Pressure injury of skin 06/09/2020   Sacral fracture ([place]) 06/08/2020   Thrombocytopenia ([place]) 06/08/2020   Hypertensive urgency 06/08/2020   (HFpEF) heart failure with preserved ejection fraction ([place]) 06/08/2020   Fall 06/08/2020   Elevated MCV 06/08/2020   Palliative care by specialist    Generalized weakness 11/14/2019   HCAP (healthcare-associated pneumonia) 11/14/2019   Hyperkalemia 11/13/2019   Hyponatremia  09/27/2019   Closed fracture of right proximal humerus 04/20/2019   Surgery, elective    Seizure ([place]) 11/12/2018   History of hydronephrosis --stents in place 12/08/2017   Stroke ([place]) 11/29/2017   Seizures ([place])    Hydronephrosis    Recurrent UTI    Diabetes mellitus type 2 in nonobese (HCC)    ESRD (end stage renal disease) ([place])    Anemia    Chronic diastolic congestive heart failure ([place])    Essential hypertension    PICC (peripherally inserted central catheter) in place    Acute pyelonephritis 11/12/2017   Uncontrolled type 2 diabetes mellitus with hyperglycemia, with long-term current use of insulin ([place]) 11/10/2017   Renal failure 11/08/2017   Seizure disorder ([place]) 11/08/2017   Orthostatic hypotension 07/25/2014   Moderate protein malnutrition ([place]) 09/15/2013   Aspiration pneumonia ([place]) 09/15/2013   Acute respiratory failure requiring reintubation ([place]) 09/11/2013   probable Seizures due to metabolic disorder 39/76/7341   Lactic acidosis 03/19/2013   Abdominal pain 03/19/2013   Rotavirus infection 10/29/2012  Diabetes mellitus with end-stage renal disease ([place]) 10/29/2012   Protein-calorie malnutrition, severe ([place]) 10/27/2012   NSTEMI (non-ST elevated myocardial infarction) ([place]) 10/26/2012   Fever, unspecified 10/26/2012   Hypotension 39/04/90   Metabolic acidosis 33/00/7622   Chronic diarrhea 10/25/2012   Tobacco abuse 10/25/2012   DKA (diabetic ketoacidoses) 09/09/2012   Dehydration 09/09/2012   DKA, type 2 ([place]) 05/20/2012   Abnormal LFTs 05/20/2012   Heart murmur, systolic 63/33/5456   Hypoglycemia 25/63/8937   Metabolic encephalopathy 34/28/7681   Alcohol abuse 07/08/2011   Hypokalemia 07/08/2011   Nausea & vomiting 07/08/2011   H/O chronic pancreatitis 07/08/2011   PCP:  System, Provider Not In Pharmacy:   [place], [place] [address] [address] Phone: [phone] Fax:  [place], [place] - [address] [place] [address] [address] Phone: [phone] Fax: [place], [place] - [address] [place] [address] Phone: [phone] Fax: [phone]     Social Determinants of Health (SDOH) Interventions    Readmission Risk Interventions    08/20/2020   11:24 AM 06/11/2020   12:31 PM 11/19/2019   11:36 AM  Readmission Risk Prevention Plan  Transportation Screening Complete Complete Complete  Medication Review (RN Care Manager) Complete Complete Complete  PCP or Specialist appointment within 3-5 days of discharge Complete  Complete  HRI or Home Care Consult Complete Complete Complete  SW Recovery Care/Counseling Consult Complete Complete Complete  Palliative Care Screening Not Applicable Not Applicable Complete  Skilled Nursing Facility Complete Complete Complete

## 2021-12-30 NOTE — Progress Notes (Signed)
Physical Therapy Treatment Patient Details Name: Erica [name] MRN: [id number] DOB: November 18, 1955 Today's Date: 12/30/2021   History of Present Illness Pt is a 66 y/o female presenting from SNF on 10/30 to [facility] for EGD but procedure cancelled. Shortly after with L gaze and R sided weakness, code stroke- TNK administered and LUE dialysis access began to bleed. Pt transferred to [facility]. Imaging negative for CVA, but reveals seizures. Extubated 11/11. PMH includes: alcohol induced pancreatitis, CAD, CKD and on HD, DM, DKA, neuropathy, seizures, HTN.    PT Comments    Pt with pleasant affect with limited functional strength and mobility of left side. Pt very deconditioned with poor trunk control and balance requiring total assist to reposition and transition to sitting<>supine. Pt able to assist with HEP and will continue to attempt but anticipate sit to stand transfers will not be possible to progress to acutely.    88% on RA with return to 2L at 96% HR 97 BP 134/68   Recommendations for follow up therapy are one component of a multi-disciplinary discharge planning process, led by the attending physician.  Recommendations may be updated based on patient status, additional functional criteria and insurance authorization.  Follow Up Recommendations  Skilled nursing-short term rehab (<3 hours/day) Can patient physically be transported by private vehicle: No   Assistance Recommended at Discharge Frequent or constant Supervision/Assistance  Patient can return home with the following Two people to help with walking and/or transfers;Two people to help with bathing/dressing/bathroom;Assistance with feeding;Direct supervision/assist for medications management;Assist for transportation   Equipment Recommendations  None recommended by PT    Recommendations for Other Services       Precautions / Restrictions Precautions Precautions: Fall     Mobility  Bed Mobility Overal bed mobility: Needs  Assistance Bed Mobility: Supine to Sit           General bed mobility comments: total assist with foot egress to transition to sitting and return to supine. Total A to slide toward HOB in trendelenburg. total assist to reposition in semi chair position. Pt with anterior lean in unsupported sitting with total assist for sitting balance. Pt unable to engage trunk for sitting balance or to initiate transition to standing    Transfers                   General transfer comment: unable    Ambulation/Gait                   Stairs             Wheelchair Mobility    Modified Rankin (Stroke Patients Only)       Balance Overall balance assessment: Needs assistance   Sitting balance-Leahy Scale: Zero Sitting balance - Comments: total assist EOB                                    Cognition Arousal/Alertness: Awake/alert Behavior During Therapy: Flat affect Overall Cognitive Status: No family/caregiver present to determine baseline cognitive functioning                     Current Attention Level: Focused   Following Commands: Follows one step commands inconsistently, Follows one step commands with increased time       General Comments: pt oriented to self, following single step commands for moving right side, very delayed with movement on left and limited  Exercises General Exercises - Upper Extremity Shoulder Flexion: AAROM, PROM, Right, Left, Seated, 10 reps (AAROM on RUE, PROM LUE) Elbow Flexion: AAROM, PROM, Right, Left, Seated, 10 reps (PROM LUE) General Exercises - Lower Extremity Short Arc Quad: AROM, PROM, Right, Left, Seated, 10 reps (PROm LLE) Hip Flexion/Marching: AAROM, Both, 10 reps, Seated    General Comments        Pertinent Vitals/Pain Pain Assessment Pain Assessment: Faces Pain Score: 3  Pain Location: back and neck Pain Descriptors / Indicators: Aching Pain Intervention(s): Limited activity  within patient's tolerance, Monitored during session, Repositioned    Home Living                          Prior Function            PT Goals (current goals can now be found in the care plan section) Progress towards PT goals: Not progressing toward goals - comment    Frequency    Min 2X/week      PT Plan Current plan remains appropriate    Co-evaluation              AM-PAC PT "6 Clicks" Mobility   Outcome Measure  Help needed turning from your back to your side while in a flat bed without using bedrails?: Total Help needed moving from lying on your back to sitting on the side of a flat bed without using bedrails?: Total Help needed moving to and from a bed to a chair (including a wheelchair)?: Total Help needed standing up from a chair using your arms (e.g., wheelchair or bedside chair)?: Total Help needed to walk in hospital room?: Total Help needed climbing 3-5 steps with a railing? : Total 6 Click Score: 6    End of Session Equipment Utilized During Treatment: Oxygen Activity Tolerance: Patient limited by fatigue Patient left: in bed;with call bell/phone within reach;with bed alarm set (in semi chair position) Nurse Communication: Mobility status PT Visit Diagnosis: Other abnormalities of gait and mobility (R26.89);Muscle weakness (generalized) (M62.81)     Time: 3016-0109 PT Time Calculation (min) (ACUTE ONLY): 26 min  Charges:  $Therapeutic Exercise: 8-22 mins $Therapeutic Activity: 8-22 mins                     Bayard Males, PT Acute Rehabilitation Services Office: [phone]    Sandy Salaam Blaine Guiffre 12/30/2021, 8:50 AM

## 2021-12-30 NOTE — Progress Notes (Signed)
Update provided to [place] at [facility]   Melven Sartorius Renal Navigator [phone]

## 2021-12-31 ENCOUNTER — Inpatient Hospital Stay
Admission: RE | Admit: 2021-12-31 | Discharge: 2022-01-05 | Payer: Medicare Other | Attending: Internal Medicine | Admitting: Internal Medicine

## 2021-12-31 ENCOUNTER — Other Ambulatory Visit (HOSPITAL_COMMUNITY): Payer: Medicare Other

## 2021-12-31 ENCOUNTER — Other Ambulatory Visit (HOSPITAL_COMMUNITY): Payer: Self-pay

## 2021-12-31 DIAGNOSIS — G40901 Epilepsy, unspecified, not intractable, with status epilepticus: Secondary | ICD-10-CM | POA: Diagnosis not present

## 2021-12-31 DIAGNOSIS — R404 Transient alteration of awareness: Secondary | ICD-10-CM | POA: Diagnosis not present

## 2021-12-31 DIAGNOSIS — L89159 Pressure ulcer of sacral region, unspecified stage: Secondary | ICD-10-CM | POA: Clinically undetermined

## 2021-12-31 DIAGNOSIS — N186 End stage renal disease: Secondary | ICD-10-CM | POA: Diagnosis not present

## 2021-12-31 DIAGNOSIS — J181 Lobar pneumonia, unspecified organism: Secondary | ICD-10-CM | POA: Diagnosis not present

## 2021-12-31 LAB — BASIC METABOLIC PANEL
Anion gap: 16 — ABNORMAL HIGH (ref 5–15)
BUN: 78 mg/dL — ABNORMAL HIGH (ref 8–23)
CO2: 23 mmol/L (ref 22–32)
Calcium: 9.4 mg/dL (ref 8.9–10.3)
Chloride: 92 mmol/L — ABNORMAL LOW (ref 98–111)
Creatinine, Ser: 2.9 mg/dL — ABNORMAL HIGH (ref 0.44–1.00)
GFR, Estimated: 17 mL/min — ABNORMAL LOW (ref >60)
Glucose, Bld: 125 mg/dL — ABNORMAL HIGH (ref 70–99)
Potassium: 5 mmol/L (ref 3.5–5.1)
Sodium: 131 mmol/L — ABNORMAL LOW (ref 135–145)

## 2021-12-31 LAB — CBC WITH DIFFERENTIAL/PLATELET
Abs Immature Granulocytes: 0.08 10*3/uL — ABNORMAL HIGH (ref 0.00–0.07)
Basophils Absolute: 0 10*3/uL (ref 0.0–0.1)
Basophils Relative: 0 %
Eosinophils Absolute: 0.4 10*3/uL (ref 0.0–0.5)
Eosinophils Relative: 3 %
HCT: 23.3 % — ABNORMAL LOW (ref 36.0–46.0)
Hemoglobin: 7.7 g/dL — ABNORMAL LOW (ref 12.0–15.0)
Immature Granulocytes: 1 %
Lymphocytes Relative: 10 %
Lymphs Abs: 1.4 10*3/uL (ref 0.7–4.0)
MCH: 31.2 pg (ref 26.0–34.0)
MCHC: 33 g/dL (ref 30.0–36.0)
MCV: 94.3 fL (ref 80.0–100.0)
Monocytes Absolute: 1.8 10*3/uL — ABNORMAL HIGH (ref 0.1–1.0)
Monocytes Relative: 13 %
Neutro Abs: 10.1 10*3/uL — ABNORMAL HIGH (ref 1.7–7.7)
Neutrophils Relative %: 73 %
Platelets: UNDETERMINED 10*3/uL (ref 150–400)
RBC: 2.47 MIL/uL — ABNORMAL LOW (ref 3.87–5.11)
RDW: 19.9 % — ABNORMAL HIGH (ref 11.5–15.5)
WBC: 13.8 10*3/uL — ABNORMAL HIGH (ref 4.0–10.5)
nRBC: 0 % (ref 0.0–0.2)

## 2021-12-31 LAB — PROTIME-INR
INR: 2.6 — ABNORMAL HIGH (ref 0.8–1.2)
Prothrombin Time: 27.2 seconds — ABNORMAL HIGH (ref 11.4–15.2)

## 2021-12-31 LAB — CBC
HCT: 22.6 % — ABNORMAL LOW (ref 36.0–46.0)
Hemoglobin: 7.2 g/dL — ABNORMAL LOW (ref 12.0–15.0)
MCH: 30.4 pg (ref 26.0–34.0)
MCHC: 31.9 g/dL (ref 30.0–36.0)
MCV: 95.4 fL (ref 80.0–100.0)
Platelets: 330 10*3/uL (ref 150–400)
RBC: 2.37 MIL/uL — ABNORMAL LOW (ref 3.87–5.11)
RDW: 20.1 % — ABNORMAL HIGH (ref 11.5–15.5)
WBC: 12.7 10*3/uL — ABNORMAL HIGH (ref 4.0–10.5)
nRBC: 0 % (ref 0.0–0.2)

## 2021-12-31 LAB — GLUCOSE, CAPILLARY
Glucose-Capillary: 121 mg/dL — ABNORMAL HIGH (ref 70–99)
Glucose-Capillary: 149 mg/dL — ABNORMAL HIGH (ref 70–99)
Glucose-Capillary: 284 mg/dL — ABNORMAL HIGH (ref 70–99)

## 2021-12-31 LAB — APTT: aPTT: 29 seconds (ref 24–36)

## 2021-12-31 MED ORDER — LIDOCAINE-PRILOCAINE 2.5-2.5 % EX CREA: 1.0000 | TOPICAL_CREAM | CUTANEOUS | Status: DC | PRN

## 2021-12-31 MED ORDER — ANTICOAGULANT SODIUM CITRATE 4% (200MG/5ML) IV SOLN: 5.0000 mL | Status: DC | PRN

## 2021-12-31 MED ORDER — PENTAFLUOROPROP-TETRAFLUOROETH EX AERO: 1.0000 | INHALATION_SPRAY | CUTANEOUS | Status: DC | PRN

## 2021-12-31 MED ORDER — LIDOCAINE HCL (PF) 1 % IJ SOLN: 5.0000 mL | INTRAMUSCULAR | Status: DC | PRN

## 2021-12-31 MED ORDER — PROSOURCE TF20 ENFIT COMPATIBL EN LIQD
60.0000 mL | Freq: Every day | ENTERAL | 0 refills | Status: AC
  Filled 2021-12-31: qty 1800, 30d supply, fill #0

## 2021-12-31 MED ORDER — FOLIC ACID 1 MG PO TABS
1.0000 mg | ORAL_TABLET | Freq: Every day | ORAL | 0 refills | Status: AC
  Filled 2021-12-31: qty 30, 30d supply, fill #0

## 2021-12-31 MED ORDER — INSULIN ASPART 100 UNIT/ML IJ SOLN
0.0000 [IU] | INTRAMUSCULAR | 0 refills | Status: AC
  Filled 2021-12-31: qty 10, 28d supply, fill #0

## 2021-12-31 MED ORDER — OXYMETAZOLINE HCL 0.05 % NA SOLN
1.0000 | Freq: Two times a day (BID) | NASAL | Status: DC
  Administered 2021-12-31 (×2): 1 via NASAL
  Filled 2021-12-31: qty 30

## 2021-12-31 MED ORDER — RENA-VITE PO TABS
1.0000 | ORAL_TABLET | Freq: Every day | ORAL | 0 refills | Status: AC
  Filled 2021-12-31: qty 30, 30d supply, fill #0

## 2021-12-31 MED ORDER — HEPARIN SODIUM (PORCINE) 1000 UNIT/ML DIALYSIS: 1000.0000 [IU] | INTRAMUSCULAR | Status: DC | PRN

## 2021-12-31 MED ORDER — HEPARIN SODIUM (PORCINE) 1000 UNIT/ML DIALYSIS: 20.0000 [IU]/kg | INTRAMUSCULAR | Status: DC | PRN

## 2021-12-31 MED ORDER — LEVETIRACETAM 100 MG/ML PO SOLN
500.0000 mg | Freq: Two times a day (BID) | ORAL | 0 refills | Status: AC
  Filled 2021-12-31: qty 300, 30d supply, fill #0

## 2021-12-31 MED ORDER — FENTANYL CITRATE PF 50 MCG/ML IJ SOSY: 25.0000 ug | PREFILLED_SYRINGE | INTRAMUSCULAR | Status: DC | PRN

## 2021-12-31 MED ORDER — FAMOTIDINE 10 MG PO TABS
10.0000 mg | ORAL_TABLET | Freq: Every day | ORAL | 0 refills | Status: AC
  Filled 2021-12-31: qty 30, 30d supply, fill #0

## 2021-12-31 MED ORDER — AMOXICILLIN-POT CLAVULANATE 400-57 MG/5ML PO SUSR
500.0000 mg | Freq: Two times a day (BID) | ORAL | 0 refills | Status: DC
  Filled 2021-12-31: qty 80, 6d supply, fill #0

## 2021-12-31 MED ORDER — JUVEN PO PACK
1.0000 | PACK | Freq: Two times a day (BID) | ORAL | 0 refills | Status: AC
  Filled 2021-12-31: qty 60, 30d supply, fill #0

## 2021-12-31 MED ORDER — LACOSAMIDE 10 MG/ML PO SOLN
50.0000 mg | ORAL | 0 refills | Status: AC
  Filled 2021-12-31: qty 60, 28d supply, fill #0

## 2021-12-31 MED ORDER — FENTANYL CITRATE PF 50 MCG/ML IJ SOSY
25.0000 ug | PREFILLED_SYRINGE | INTRAMUSCULAR | Status: DC | PRN
  Administered 2021-12-31: 50 ug via INTRAVENOUS
  Filled 2021-12-31: qty 1

## 2021-12-31 MED ORDER — PHENYTOIN 125 MG/5ML PO SUSP
100.0000 mg | Freq: Three times a day (TID) | ORAL | 0 refills | Status: AC
  Filled 2021-12-31: qty 360, 30d supply, fill #0

## 2021-12-31 MED ORDER — AMOXICILLIN-POT CLAVULANATE 400-57 MG/5ML PO SUSR
500.0000 mg | Freq: Two times a day (BID) | ORAL | 0 refills | Status: AC
  Filled 2021-12-31: qty 12.6, 1d supply, fill #0

## 2021-12-31 MED ORDER — ALTEPLASE 2 MG IJ SOLR: 2.0000 mg | Freq: Once | INTRAMUSCULAR | Status: DC | PRN

## 2021-12-31 MED ORDER — DARBEPOETIN ALFA 60 MCG/0.3ML IJ SOSY
60.0000 ug | PREFILLED_SYRINGE | INTRAMUSCULAR | 0 refills | Status: AC
  Filled 2021-12-31: qty 4.2, 98d supply, fill #0

## 2021-12-31 MED ORDER — PEPTAMEN 1.5 CAL PO LIQD
1000.0000 mL | ORAL | 0 refills | Status: AC
  Filled 2021-12-31: qty 3000, fill #0

## 2021-12-31 MED ORDER — LACOSAMIDE 10 MG/ML PO SOLN
150.0000 mg | Freq: Two times a day (BID) | ORAL | 0 refills | Status: AC
  Filled 2021-12-31: qty 900, 30d supply, fill #0

## 2021-12-31 MED ORDER — LEVETIRACETAM 100 MG/ML PO SOLN
250.0000 mg | ORAL | 0 refills | Status: AC | PRN
  Filled 2021-12-31: qty 30, fill #0

## 2021-12-31 NOTE — Progress Notes (Signed)
Tube feed turned off. Patient with nasal bleed at this time. Afrin nasally given. Blood trickling down back of patients throat.  Patient aspirating blood.  Md notified.

## 2021-12-31 NOTE — Progress Notes (Addendum)
Occupational Therapy Treatment Patient Details Name: Erica [name] MRN: [id number] DOB: Dec 06, 1955 Today's Date: 12/31/2021   History of present illness Pt is a 66 y/o female presenting from SNF on 10/30 to [facility] for EGD but procedure cancelled. Shortly after with L gaze and R sided weakness, code stroke- TNK administered and LUE dialysis access began to bleed. Pt transferred to [facility]. Imaging negative for CVA, but reveals seizures. Extubated 11/11. PMH includes: alcohol induced pancreatitis, CAD, CKD and on HD, DM, DKA, neuropathy, seizures, HTN.   OT comments  Patient received in supine with right lateral leaning while in bed. Patient positioned in bed with HOB raised and in better alignment. Grooming address with face washing with patient requiring assistance at elbow to reach face. AAROM and AROM performed to RUE elbow, shoulder, and hand and PROM to LUE.  Patient required repositioning throughout visit. Patient left in bed with BUE elevated to address edema. Acute OT to continue to follow.  SpO2 100$ on 3 liters HR 99 BP 160/70   Recommendations for follow up therapy are one component of a multi-disciplinary discharge planning process, led by the attending physician.  Recommendations may be updated based on patient status, additional functional criteria and insurance authorization.    Follow Up Recommendations  Skilled nursing-short term rehab (<3 hours/day)     Assistance Recommended at Discharge Frequent or constant Supervision/Assistance  Patient can return home with the following  Two people to help with walking and/or transfers;Two people to help with bathing/dressing/bathroom;Assistance with feeding;Direct supervision/assist for medications management;Direct supervision/assist for financial management;Assist for transportation;Help with stairs or ramp for entrance   Equipment Recommendations  Other (comment) (defer)    Recommendations for Other Services      Precautions /  Restrictions Precautions Precautions: Fall Restrictions Weight Bearing Restrictions: No       Mobility Bed Mobility Overal bed mobility: Needs Assistance Bed Mobility: Rolling Rolling: Max assist         General bed mobility comments: assisted patient with positioning in bed due to rigth lateral leaning    Transfers                   General transfer comment: unable     Balance       Sitting balance - Comments: patient seen at bedlevel                                   ADL either performed or assessed with clinical judgement   ADL Overall ADL's : Needs assistance/impaired     Grooming: Wash/dry face;Bed level;Moderate assistance Grooming Details (indicate cue type and reason): support at elbow to allow patient to wash face with RUE                               General ADL Comments: total assist for most ADL tasks    Extremity/Trunk Assessment Upper Extremity Assessment RUE Deficits / Details: edema, PROM WFL but grossly 3-/5 MMT RUE Coordination: decreased gross motor;decreased fine motor LUE Deficits / Details: edema, PROM WFL but appears weaker than R UE grossly 2+/5            Vision       Perception     Praxis      Cognition Arousal/Alertness: Awake/alert Behavior During Therapy: Flat affect Overall Cognitive Status: No family/caregiver present to determine baseline cognitive  functioning Area of Impairment: Orientation, Attention, Following commands                 Orientation Level: Disoriented to, Place, Time, Situation Current Attention Level: Focused   Following Commands: Follows one step commands inconsistently, Follows one step commands with increased time       General Comments: able to speak her name and voiced location of pain.        Exercises Exercises: General Upper Extremity General Exercises - Upper Extremity Shoulder Flexion: AROM, AAROM, 10 reps, Right, PROM, Left Shoulder  ABduction: AROM, AAROM, 10 reps, Right, PROM, Left Elbow Flexion: AROM, AAROM, 10 reps, Right, PROM, Left    Shoulder Instructions       General Comments at end of session BP 160/70, HR 99, SpO2 100% on 3 liters    Pertinent Vitals/ Pain       Pain Assessment Pain Assessment: Faces Faces Pain Scale: Hurts a little bit Pain Location: abdomen, left shoulder, back, and neck Pain Descriptors / Indicators: Aching, Grimacing Pain Intervention(s): Limited activity within patient's tolerance, Monitored during session, Repositioned  Home Living                                          Prior Functioning/Environment              Frequency  Min 2X/week        Progress Toward Goals  OT Goals(current goals can now be found in the care plan section)  Progress towards OT goals: Progressing toward goals  Acute Rehab OT Goals OT Goal Formulation: Patient unable to participate in goal setting Time For Goal Achievement: 01/11/22 Potential to Achieve Goals: Fair ADL Goals Pt Will Perform Grooming: with min assist;sitting Pt/caregiver will Perform Home Exercise Program: Increased strength;Increased ROM;Both right and left upper extremity Additional ADL Goal #1: Pt will complete bed mobility with mod assist as percursor to ADLs.  Plan Discharge plan remains appropriate    Co-evaluation                 AM-PAC OT "6 Clicks" Daily Activity     Outcome Measure   Help from another person eating meals?: Total Help from another person taking care of personal grooming?: Total Help from another person toileting, which includes using toliet, bedpan, or urinal?: Total Help from another person bathing (including washing, rinsing, drying)?: Total Help from another person to put on and taking off regular upper body clothing?: Total Help from another person to put on and taking off regular lower body clothing?: Total 6 Click Score: 6    End of Session Equipment  Utilized During Treatment: Oxygen (3 liters)  OT Visit Diagnosis: Other abnormalities of gait and mobility (R26.89);Muscle weakness (generalized) (M62.81);Pain;Other symptoms and signs involving cognitive function;Other symptoms and signs involving the nervous system (R29.898) Pain - Right/Left: Left Pain - part of body: Shoulder   Activity Tolerance Patient tolerated treatment well   Patient Left in bed;with call bell/phone within reach;with bed alarm set   Nurse Communication Mobility status        Time: 2947-6546 OT Time Calculation (min): 23 min  Charges: OT General Charges $OT Visit: 1 Visit OT Treatments $Self Care/Home Management : 8-22 mins $Therapeutic Exercise: 8-22 mins  Lodema Hong, [place]  Office [phone]   Trixie Dredge 12/31/2021, 12:53 PM

## 2021-12-31 NOTE — TOC Progression Note (Signed)
Transition of Care [facility]) - Initial/Assessment Note    Patient Details  Name: Erica [name] MRN: [id number] Date of Birth: Nov 23, 1955  Transition of [facility]) CM/SW Contact:    Milinda Antis, [place] Phone Number: 12/31/2021, 11:16 AM  Clinical Narrative:                 LCSW contacted the patient's son and presented choice for LTACH.  The son is in agreement with the discharge to and [facility] and [facility] is the facility of choice.  MD and [facility] representative notified.     Patient Goals and CMS Choice        Expected Discharge Plan and Services                                                Prior Living Arrangements/Services                       Activities of Daily Living      Permission Sought/Granted                  Emotional Assessment              Admission diagnosis:  Altered mental status [R41.82] Patient Active Problem List   Diagnosis Date Noted   Sacral decubitus ulcer 12/31/2021   Lobar pneumonia, unspecified organism ([place]) 12/30/2021   Status epilepticus ([place]) 12/18/2021   Esophageal dysphagia 11/10/2021   Poor appetite 11/10/2021   H/O adenomatous polyp of colon 11/10/2021   Benign essential HTN 10/17/2021   Macrocytic anemia 10/13/2021   GI bleed 10/13/2021   Elevated troponin 10/13/2021   GERD (gastroesophageal reflux disease) 10/13/2021   Colitis 10/12/2021   Atrial fibrillation ([place])    Hypothermia 06/19/2021   Abnormal EKG 06/19/2021   Right hip pain    CAD (coronary artery disease) 09/17/2020   Altered mental status    Acute metabolic encephalopathy 17/61/6073   Diarrhea    Pressure injury of skin 06/09/2020   Sacral fracture ([place]) 06/08/2020   Thrombocytopenia ([place]) 06/08/2020   Hypertensive urgency 06/08/2020   (HFpEF) heart failure with preserved ejection fraction ([place]) 06/08/2020   Fall 06/08/2020   Elevated MCV 06/08/2020   Palliative care by specialist    Generalized weakness 11/14/2019    HCAP (healthcare-associated pneumonia) 11/14/2019   Hyperkalemia 11/13/2019   Hyponatremia 09/27/2019   Closed fracture of right proximal humerus 04/20/2019   Surgery, elective    Seizure ([employer]) 11/12/2018   History of hydronephrosis --stents in place 12/08/2017   Stroke ([place]) 11/29/2017   Seizures ([place])    Hydronephrosis    Recurrent UTI    Diabetes mellitus type 2 in nonobese ([place])    ESRD (end stage renal disease) ([place])    Anemia    Chronic diastolic congestive heart failure ([place])    Essential hypertension    PICC (peripherally inserted central catheter) in place    Acute pyelonephritis 11/12/2017   Uncontrolled type 2 diabetes mellitus with hyperglycemia, with long-term current use of insulin ([place]) 11/10/2017   Renal failure 11/08/2017   Seizure disorder ([place]) 11/08/2017   Orthostatic hypotension 07/25/2014   Moderate protein malnutrition ([place]) 09/15/2013   Aspiration pneumonia ([place]) 09/15/2013   Acute respiratory failure requiring reintubation ([place]) 09/11/2013   probable Seizures due to metabolic disorder 71/07/2692   Lactic acidosis 03/19/2013  Abdominal pain 03/19/2013   Rotavirus infection 10/29/2012   Diabetes mellitus with end-stage renal disease ([place]) 10/29/2012   Protein-calorie malnutrition, severe ([place]) 10/27/2012   NSTEMI (non-ST elevated myocardial infarction) ([place]) 10/26/2012   Fever, unspecified 10/26/2012   Hypotension 05/39/7673   Metabolic acidosis 41/93/7902   Chronic diarrhea 10/25/2012   Tobacco abuse 10/25/2012   DKA (diabetic ketoacidoses) 09/09/2012   Dehydration 09/09/2012   DKA, type 2 ([place]) 05/20/2012   Abnormal LFTs 05/20/2012   Heart murmur, systolic 40/97/3532   Hypoglycemia 99/24/2683   Metabolic encephalopathy 41/96/2229   Alcohol abuse 07/08/2011   Hypokalemia 07/08/2011   Nausea & vomiting 07/08/2011   H/O chronic pancreatitis 07/08/2011   PCP:  System, Provider Not In Pharmacy:   [place], [place] [address] Phone: [phone] Fax: [place], [place] - [address] [place] [address] [place] [address] Phone: [phone] Fax: [place], [place] - [address] [place] [address] Phone: [phone] Fax: [phone]     Social Determinants of Health (SDOH) Interventions    Readmission Risk Interventions    08/20/2020   11:24 AM 06/11/2020   12:31 PM 11/19/2019   11:36 AM  Readmission Risk Prevention Plan  Transportation Screening Complete Complete Complete  Medication Review (RN Care Manager) Complete Complete Complete  PCP or Specialist appointment within 3-5 days of discharge Complete  Complete  HRI or Home Care Consult Complete Complete Complete  SW Recovery Care/Counseling Consult Complete Complete Complete  Palliative Care Screening Not Applicable Not Applicable Complete  Skilled Nursing Facility Complete Complete Complete

## 2021-12-31 NOTE — Progress Notes (Signed)
Contacted [employer] and spoke to [place]. Clinic advised that pt is supposed to transfer to [facility] this afternoon. Clinic advised that [facility] case management staff will f/u with clinic in regards to resumption date once known.   Melven Sartorius Renal Navigator [phone]

## 2021-12-31 NOTE — Progress Notes (Signed)
Report called to Autumn, Therapist, sports of [facility]. All questions/concerns addressed. Patient's personal belongings and paperwork hand delivered to [facility] by Janett Billow, Agricultural consultant. No additional needs.

## 2021-12-31 NOTE — Progress Notes (Signed)
Speech Language Pathology Treatment: Dysphagia  Patient Details Name: Erica [name] MRN: [id number] DOB: 1955-06-19 Today's Date: 12/31/2021 Time: 5329-9242 SLP Time Calculation (min) (ACUTE ONLY): 10 min  Assessment / Plan / Recommendation Clinical Impression  Pt seen for ongoing dysphagia management.  Pt awake in bed.  SLP provided oral care prior to administration of OP trials.  Thick blood-tinged (bright red) secretions removed from oral cavity.  Pt with nosebleed overnight.  Pt with RR of 27-28 on arrival and remained in high 20s throughout session.  Very small amounts of water given by spoon.  There was L side anterior loss although pt tends to lean toward R side.  Pt otherwise did not appear to have facial asymmetry.  SLP repositioned at midline between trials.  There was no pharyngeal swallow response on 2 of 3 trials.  There was slight laryngeal movement noted x1, but hyolaryngeal elevation/excursion was seemingly inadequate.  We coughing noted after bolus trials given. No desaturation noted today.  Pt is not appropriate to initiate PO diet or for instrumental swallow evaluation at this time. Extubated 4 days ago without improvement in swallowing and in fact appears to have had a decline since initially extubated.  Recommend considering long term AMN if consistent with goals of care; however, this will not eliminate risk of aspiration as pt appears to be having difficulty with secretion management with suspected aspiration overnight with nosebleed.  Recommend pt remain NPO with alternate means of nutrition, hydration, and medication.    HPI HPI: 66 y.o. female admitted with status epilepticus, metabolic encephalopathy.  ETT 10/30-11/11. Pt with past medical history of seizure disorder, ESRD, HTN, DM2, COPD, dCHF, gastroparesis, gerd, heart murmur, HLD, kidney stones, neuropathic pain/neuropathy, and depression admitted on 12/15/2021 with unresponsiveness and status epilepticus.       SLP Plan  Continue with current plan of care      Recommendations for follow up therapy are one component of a multi-disciplinary discharge planning process, led by the attending physician.  Recommendations may be updated based on patient status, additional functional criteria and insurance authorization.    Recommendations  Diet recommendations: NPO Medication Administration: Via alternative means                Oral Care Recommendations: Oral care QID Follow Up Recommendations: Skilled nursing-short term rehab (<3 hours/day) Assistance recommended at discharge: Frequent or constant Supervision/Assistance SLP Visit Diagnosis: Dysphagia, oropharyngeal phase (R13.12) Plan: Continue with current plan of care           Celedonio Savage, [place], [place] Office: [phone] 12/31/2021, 9:57 AM

## 2021-12-31 NOTE — Progress Notes (Signed)
[facility] NEPHROLOGY PROGRESS NOTE  Assessment/ Plan: Pt is a 66 y.o. yo female with HTN, DM, CHF, seizure disorder, ESRD on HD TTS, admitted with status epilepticus, metabolic encephalopathy.  #Nonconvulsive status epilepticus, per neurology MRI changes are due to prolonged status epilepticus.  Per neurology team.  # ESRD normally TTS at [facility]  Using AV graft for the dialysis.  She is receiving MWF schedule in ICU, plan for HD today.  She looks very frail and deconditioned on therefore we need to make sure that she can tolerate dialysis on chair before discharge.  She can come up to the unit for HD.  # Anemia: Iron saturation 38 very high ferritin level. Started erythropoietin with HD.  Monitor hemoglobin.  # Secondary hyperparathyroidism: Calcium phosphorus level acceptable.  Currently n.p.o. and not on binders.  # HTN/volume: BP acceptable.  Volume managed with HD.  #Fever/leukocytosis: Currently on Augmentin, per primary team.  Subjective: Seen and examined in ICU.  Noted patient is moving out of ICU to the floor.  She is alert awake.  Confused and frail.  No new event.  Plan for HD today. Objective Vital signs in last 24 hours: Vitals:   12/31/21 0530 12/31/21 0600 12/31/21 0700 12/31/21 0736  BP: (!) 157/71 (!) 147/75 (!) 142/69   Pulse: (!) 103 91 96   Resp: (!) 24 (!) 27 (!) 25   Temp:    99.6 F (37.6 C)  TempSrc:    Axillary  SpO2: 100% 100% 100%   Weight:      Height:       Weight change: -6.9 kg  Intake/Output Summary (Last 24 hours) at 12/31/2021 0838 Last data filed at 12/31/2021 0700 Gross per 24 hour  Intake 1085 ml  Output --  Net 1085 ml        Labs: RENAL PANEL Recent Labs    10/17/21 0444 10/18/21 0528 10/19/21 0502 11/04/21 1218 12/15/21 1325 12/15/21 1349 12/15/21 2140 12/16/21 0212 12/16/21 0905 12/17/21 1630 12/18/21 0358 12/18/21 1428 12/18/21 1645 12/19/21 0339 12/19/21 1835 12/20/21 0508  12/21/21 0210 12/21/21 0512 12/22/21 0314 12/23/21 0039 12/24/21 1231 12/25/21 0327 12/26/21 1435 12/27/21 0212 12/29/21 1256 12/29/21 1749 12/30/21 0616 12/31/21 0458  NA 142 142 140   < > 123*   < > 126* 128*   < >  --  136 136   < > 132*  --  132*   < > 124* 124* 124* 129* 129* 130* 132* 128* 126* 134* 131*  K 2.9* 3.9 3.6   < > 7.2*   < > 7.2* 6.1*   < >  --  2.7* 3.8   < > 3.6  --  3.4*   < > 4.3 5.0 5.1 4.3 3.6 4.2 4.0 5.9* 6.1* 4.2 5.0  CL 108 110 107   < > 89*   < > 88* 89*   < >  --  93* 95*  --  98  --  89*   < > 87* 85* 87* 90* 90* 94* 94* 88*  --  90* 92*  CO2 27 27 28   --  21*   < > 20* 21*   < >  --  28 28  --  24  --  25   < > 25 23 21* 24 23 22 25  19*  --  26 23  GLUCOSE 153* 137* 204*   < > 131*   < > 71 107*   < >  --  140* 148*  --  111*  --  196*   < > 226* 106* 118* 184* 160* 154* 193* 178*  --  71 125*  BUN 14 20 14    < > 55*   < > 55* 57*   < >  --  11 15  --  20  --  15   < > 27* 45* 58* 52* 35* 59* 32* 83*  --  46* 78*  CREATININE 2.60* 3.68* 2.35*   < > 3.79*   < > 4.16* 4.45*   < >  --  1.91* 2.22*  --  2.50*  --  1.80*   < > 2.46* 2.96* 3.48* 2.84* 1.85* 2.73* 1.69* 3.42*  --  2.02* 2.90*  CALCIUM 8.0* 8.1* 7.8*  --  9.8   < > 10.0 10.1   < >  --  9.2 8.8*  --  8.4*  --  8.7*   < > 8.6* 8.8* 8.7* 8.8* 8.4* 8.6* 8.6* 9.3  --  9.0 9.4  MG 1.8 1.8 1.7  --   --   --   --   --   --  1.7 1.6* 2.1  --  2.2 1.7 1.7  --   --   --  1.8  --  1.7  --   --  1.9  --   --   --   PHOS 2.5 3.7 2.6  --   --   --  4.3 4.0  --  3.9 2.6 3.0  --  3.3 1.8* 2.1*  --   --   --  3.4 3.5 2.6 3.9  --   --   --   --   --   ALBUMIN 2.3* 2.2* 2.2*  --  4.4  --  3.5 3.3*  --   --   --   --   --   --   --   --   --   --   --  2.1* 2.1*  --  1.9* 2.1*  --   --   --   --    < > = values in this interval not displayed.      Liver Function Tests: Recent Labs  Lab 12/24/21 1231 12/26/21 1435 12/27/21 0212  AST  --   --  84*  ALT  --   --  82*  ALKPHOS  --   --  478*  BILITOT  --   --  2.8*   PROT  --   --  5.9*  ALBUMIN 2.1* 1.9* 2.1*    No results for input(s): "LIPASE", "AMYLASE" in the last 168 hours. No results for input(s): "AMMONIA" in the last 168 hours. CBC: Recent Labs    06/20/21 0434 06/20/21 0845 10/13/21 0517 10/13/21 1359 10/14/21 0744 12/27/21 0212 12/27/21 0925 12/29/21 1256 12/29/21 1749 12/30/21 0616 12/31/21 0458  HGB  --    < > 8.4*  --    < > 8.4*  --  8.3* 9.2* 8.1* 7.7*  MCV  --    < > 101.9*  --    < > 95.3  --  91.9  --  92.6 94.3  VITAMINB12  --   --  1,598*  --   --   --   --   --   --   --   --   FOLATE  --   --  >40.0  --   --   --   --   --   --   --   --  FERRITIN 1,008*  --   --  1,582*  --   --  3,059*  --   --   --   --   TIBC 144*  --   --  131*  --   --  172*  --   --   --   --   IRON 40  --   --  39  --   --  66  --   --   --   --    < > = values in this interval not displayed.     Cardiac Enzymes: No results for input(s): "CKTOTAL", "CKMB", "CKMBINDEX", "TROPONINI" in the last 168 hours. CBG: Recent Labs  Lab 12/30/21 1631 12/30/21 1952 12/30/21 2337 12/31/21 0344 12/31/21 0734  GLUCAP 251* 250* 146* 121* 149*     Iron Studies:  No results for input(s): "IRON", "TIBC", "TRANSFERRIN", "FERRITIN" in the last 72 hours.  Studies/Results: VAS [place] UPPER EXTREMITY VENOUS DUPLEX  Result Date: 12/30/2021 UPPER VENOUS STUDY  Patient Name:  Erica [name]  Date of Exam:   12/30/2021 Medical Rec #: [id number]           Accession #:    [id number] Date of Birth: 01/23/1956           Patient Gender: F Patient Age:   53 years Exam Location:  [facility] Procedure:      VAS [place] UPPER EXTREMITY VENOUS DUPLEX Referring Phys: Elease Etienne --------------------------------------------------------------------------------  Indications: Left arm swelling, patient with left upper arm AV graft currently in use Risk Factors: 12-15-2021 Hemorrhage from left-sided AV graft secondary to TNK administration for stroke. Comparison Study:  No prior studies. Performing Technologist: Darlin Coco RDMS, RVT  Examination Guidelines: A complete evaluation includes B-mode imaging, spectral Doppler, color Doppler, and power Doppler as needed of all accessible portions of each vessel. Bilateral testing is considered an integral part of a complete examination. Limited examinations for reoccurring indications may be performed as noted.  Right Findings: +----------+------------+---------+-----------+----------+-------+ RIGHT     CompressiblePhasicitySpontaneousPropertiesSummary +----------+------------+---------+-----------+----------+-------+ Subclavian               Yes       Yes                      +----------+------------+---------+-----------+----------+-------+  Left Findings: +----------+------------+---------+-----------+----------+---------------------+ LEFT      CompressiblePhasicitySpontaneousProperties       Summary        +----------+------------+---------+-----------+----------+---------------------+ IJV           Full       Yes       Yes                                    +----------+------------+---------+-----------+----------+---------------------+ Subclavian               Yes       Yes                                    +----------+------------+---------+-----------+----------+---------------------+ Axillary                 Yes       Yes               Unable to compress  secondary to                                                          AVG/pressure gradient +----------+------------+---------+-----------+----------+---------------------+ Brachial      Full                                                        +----------+------------+---------+-----------+----------+---------------------+ Radial        Full                                                         +----------+------------+---------+-----------+----------+---------------------+ Ulnar         Full                                                        +----------+------------+---------+-----------+----------+---------------------+ Cephalic      Full                                                        +----------+------------+---------+-----------+----------+---------------------+ Basilic       Full                                                        +----------+------------+---------+-----------+----------+---------------------+  Summary:  Right: No evidence of thrombosis in the subclavian.  Left: No evidence of deep vein thrombosis in the upper extremity. No evidence of superficial vein thrombosis in the upper extremity. Approximately 4 cm x 1.5 cm peri-graft hypoechoic focal collection noted at the upper arm. Likely representing hematoma given history of hemorrhage post-TNK administration. Subcutaneous edema noted throughout extremity.  *See table(s) above for measurements and observations.  Diagnosing physician: Deitra Mayo MD Electronically signed by Deitra Mayo MD on 12/30/2021 at 11:16:48 PM.    Final    DG CHEST PORT 1 VIEW  Result Date: 12/30/2021 CLINICAL DATA:  Dyspnea EXAM: PORTABLE CHEST 1 VIEW COMPARISON:  Previous studies including the examination of 12/29/2021 FINDINGS: Transverse diameter of heart is increased. Calcification is seen in the region of mitral annulus. There is interval decrease in pulmonary vascular congestion. There are no signs of alveolar pulmonary edema. There is improvement in aeration of lower lung fields. There is small residual linear density in medial left lower lung field. There is no pleural effusion or pneumothorax. Enteric tube is noted traversing the esophagus. IMPRESSION: Cardiomegaly. There is interval decrease in pulmonary vascular congestion and improvement in aeration in both lungs. Subsegmental atelectasis is seen  in medial left lower lung field.  Electronically Signed   By: Elmer Picker M.D.   On: 12/30/2021 08:15   DG CHEST PORT 1 VIEW  Result Date: 12/29/2021 CLINICAL DATA:  Dyspnea ROVER EXAM: PORTABLE CHEST - 1 VIEW COMPARISON:  12/25/2021 FINDINGS: Patient has been extubated. The feeding tube extends at least as far as the stomach, tip not seen. Lower lung volumes with crowding of perihilar bronchovascular structures. No confluent airspace disease or convincing edema. Heart size and mediastinal contours are within normal limits. No effusion. Right humerus orthopedic hardware. IMPRESSION: Low volumes post extubation. No acute findings. Electronically Signed   By: Lucrezia Europe M.D.   On: 12/29/2021 12:57    Medications: Infusions:  sodium chloride Stopped (12/23/21 2244)   feeding supplement (PEPTAMEN 1.5 CAL) 45 mL/hr at 12/31/21 0700    Scheduled Medications:  sodium chloride   Intravenous Once   amoxicillin-clavulanate  500 mg Per Tube Q12H   Chlorhexidine Gluconate Cloth  6 each Topical Daily   Chlorhexidine Gluconate Cloth  6 each Topical Q0600   [START ON 01/03/2022] darbepoetin (ARANESP) injection - DIALYSIS  60 mcg Subcutaneous Q Sat-1800   famotidine  10 mg Per Tube Daily   feeding supplement (PROSource TF20)  60 mL Per Tube Daily   folic acid  1 mg Per Tube Q1200   heparin injection (subcutaneous)  5,000 Units Subcutaneous Q12H   insulin aspart  0-6 Units Subcutaneous Q4H   lacosamide  150 mg Per Tube BID   levETIRAcetam  500 mg Per Tube BID   multivitamin  1 tablet Per Tube QHS   nutrition supplement (JUVEN)  1 packet Per Tube BID BM   mouth rinse  15 mL Mouth Rinse 4 times per day   oxymetazoline  1 spray Each Nare BID   phenytoin  100 mg Per Tube Q8H   sodium chloride flush  10-40 mL Intracatheter Q12H   vitamin E  400 Units Per Tube Daily    have reviewed scheduled and prn medications.  Physical Exam: General: Alert but confused female looks comfortable. Heart:RRR,  s1s2 nl Lungs:clear b/l, no crackle Abdomen:soft, Non-tender, non-distended Extremities:No edema Dialysis Access: Left upper extremity AV graft has dressing on, no bleeding noted.  Slayton Lubitz Prasad Nayquan Evinger 12/31/2021,8:38 AM  LOS: 16 days

## 2021-12-31 NOTE — Discharge Summary (Addendum)
Physician Discharge Summary  Erica [name] YQI:[id number] DOB: June 11, 1955 DOA: 12/15/2021  PCP: System, Provider Not In  Admit date: 12/15/2021 Discharge date: 12/31/2021  Admitted From: Home Disposition:  LTACH  Recommendations for Outpatient Follow-up:  Follow up with PCP in 1-2 weeks Follow-up with neurology as scheduled Follow-up with nephrology as scheduled, continue dialysis Tuesday Thursday Saturday  Discharge Condition: Stable CODE STATUS: Full Diet recommendation: Tube feeds as below  Brief/Interim Summary: 66 year old woman who presented to [place] from [facility] for scheduled EGD; this was cancelled as chewing tobacco was found in patient's mouth at the time of assessment. PMHx significant for HTN, HLD, CHF, seizure disorder (on Keppra), T2DM with peripheral neuropathy, ESRD on HD (TTS via LUE AVF).    Hospital Course: 10/30 - CTA Head/Neck 1. No emergent large vessel occlusion or proximal hemodynamically significant stenosis. 2. Normal CT perfusion. 10/31 - R fem HD cath, EEG suggestive of moderate to severe diffuse encephalopathy, nonspecific etiology. No seizures or epileptiform discharges were seen throughout the recording. MRI Brain no evidence for acute stroke, left cortical ribbon, question consistent with seizure 11/4 - No seizures overnight. Plan to wean off propofol today and continue home AEDs.  11/5 - No seizures overnight. Hyponatremic (124) on morning BMP. 11/6 - Failed PSV/wean due to low Vt. Remains off of sedation since 11/4, minimally responsive. LTM EEG ongoing. 11/7 - Tolerating PSV 10/5. LTM EEG off.  MRI showed increased cortical signal consistent with recent seizure 11/7 MRI-IMPRESSION:1. Increased cortical signal on diffusion-weighted sequence and T2 weighted sequences in the left frontoparietal region, new compared to 12/16/2021, favored to represent sequela of a recent seizure, given the distribution. 11/11 extubated  11/13, tachypnea ,  Palliative care was supposed to meet with son, he was a no show.  11/14, stable no overnight events, meets criteria for LTACH, palliative met with family by phone, continue full code and scope of care  Discharge Diagnoses:  Principal Problem:   Altered mental status Active Problems:   ESRD (end stage renal disease) ([place])   Status epilepticus ([place])   Lobar pneumonia, unspecified organism ([place])   Sacral decubitus ulcer  Nonconvulsive status epilepticus Metabolic encephalopathy History of epilepsy/seizure disorder -EEG 11/2 with con-convulsive status -Continue antiepileptics including Keppra Vimpat phenytoin -Ativan for breakthrough seizures -[name]   Acute hypoxic respiratory failure -Was able to extubate 11/11 -Tachypnea 11/13 >> + 12 L  -improved respiratory status today and down to 2L [place] after 2.5L off - Continue oxygen supplementation   End-stage renal disease on hemodialysis - Left upper extremity HD access - last HD 11/13,plan for HD 11/15   Fever/leukocytosis -Suspected pneumonia, persistent intermittent fevers, but CXR appears improved today -Completed Augmenting prior to discharge -Respiratory/blood cultures no growth thus far   Anemia secondary to blood loss -Trend CBC, stable   Type 2 diabetes, Insulin dependent, well controlled - Moderate hypoglycemia noted with poor PO intake secondary to above - Recent A1C 5.5 - Continue tube feeds/insulin coverage.   Severe protein caloric malnutrition - Continue enteral supplementation per [place] [facility] - TF only as swallow has declined last 24 hours per speech   Discharge Instructions   Allergies as of 12/31/2021       Reactions   Aspirin Palpitations, Other (See Comments)   Listed on [facility] 11/12/18   Metformin And Related         Medication List     STOP taking these medications    albuterol 108 (  90 Base) MCG/ACT inhaler Commonly known as: VENTOLIN HFA    calcitRIOL 0.25 MCG capsule Commonly known as: ROCALTROL   carvedilol 25 MG tablet Commonly known as: COREG   Darbepoetin Alfa 200 MCG/0.4ML Sosy injection Commonly known as: ARANESP Replaced by: Darbepoetin Alfa 60 MCG/0.3ML Sosy injection   ferric citrate 1 GM 210 MG(Fe) tablet Commonly known as: AURYXIA   hydrALAZINE 50 MG tablet Commonly known as: APRESOLINE   levETIRAcetam 500 MG tablet Commonly known as: Keppra Replaced by: levETIRAcetam 100 MG/ML solution   lidocaine-prilocaine cream Commonly known as: EMLA   zinc sulfate 220 (50 Zn) MG capsule       TAKE these medications    acetaminophen 325 MG tablet Commonly known as: TYLENOL Take 2 tablets (650 mg total) by mouth every 6 (six) hours as needed for mild pain (or Fever >/= 101).   amoxicillin-clavulanate 400-57 MG/5ML suspension Commonly known as: AUGMENTIN Place 6.3 mLs (500 mg total) into feeding tube every 12 (twelve) hours for 6 days.   ascorbic acid 500 MG tablet Commonly known as: VITAMIN C Take 500 mg by mouth 2 (two) times daily.   Biotin 10 MG Tabs Take 10 mg by mouth daily at 12 noon.   Darbepoetin Alfa 60 MCG/0.3ML Sosy injection Commonly known as: ARANESP Inject 0.3 mLs (60 mcg total) into the skin every Saturday at 6 PM. Start taking on: January 03, 2022 Replaces: Darbepoetin Alfa 200 MCG/0.4ML Sosy injection   famotidine 10 MG tablet Commonly known as: PEPCID Place 1 tablet (10 mg total) into feeding tube daily. Start taking on: January 01, 2022   feeding supplement (PEPTAMEN 1.5 CAL) Liqd Place 1,000 mLs into feeding tube continuous.   nutrition supplement (JUVEN) Pack Place 1 packet into feeding tube 2 (two) times daily between meals.   feeding supplement (PROSource TF20) liquid Place 60 mLs into feeding tube daily. Start taking on: January 01, 4817   folic acid 1 MG tablet Commonly known as: FOLVITE Place 1 tablet (1 mg total) into feeding tube daily at 12 noon. What  changed: how to take this   insulin aspart 100 UNIT/ML injection Commonly known as: novoLOG Inject 0-6 Units into the skin every 4 (four) hours. What changed:  how much to take when to take this additional instructions   lacosamide 10 MG/ML oral solution Commonly known as: VIMPAT Take 15 mLs (150 mg total) by mouth 2 (two) times daily.   lacosamide 10 MG/ML oral solution Commonly known as: VIMPAT Take 5 mLs (50 mg total) by mouth 3 (three) times a week. After HD session   levETIRAcetam 100 MG/ML solution Commonly known as: KEPPRA Place 5 mLs (500 mg total) into feeding tube 2 (two) times daily. Replaces: levETIRAcetam 500 MG tablet   levETIRAcetam 100 MG/ML solution Commonly known as: KEPPRA Place 2.5 mLs (250 mg total) into feeding tube as needed (give after each HD).   mirtazapine 30 MG tablet Commonly known as: REMERON Take 30 mg by mouth at bedtime.   MULTIVITAMIN ADULT PO Take 1 tablet by mouth daily at 12 noon.   multivitamin Tabs tablet Place 1 tablet into feeding tube at bedtime.   pantoprazole 40 MG tablet Commonly known as: Protonix Take 1 tablet (40 mg total) by mouth daily. What changed: when to take this   phenytoin 125 MG/5ML suspension Commonly known as: DILANTIN Place 4 mLs (100 mg total) into feeding tube every 8 (eight) hours.        Allergies  Allergen Reactions  Aspirin Palpitations and Other (See Comments)    Listed on [facility] 11/12/18   Metformin And Related     Consultations: Nephro, PCCM   Procedures/Studies: VAS [place] UPPER EXTREMITY VENOUS DUPLEX  Result Date: 12/30/2021 UPPER VENOUS STUDY  Patient Name:  Erica [name]  Date of Exam:   12/30/2021 Medical Rec #: [id number]           Accession #:    [id number] Date of Birth: [birth year]           Patient Gender: F Patient Age:   75 years Exam Location:  [facility] Procedure:      VAS [place] UPPER EXTREMITY VENOUS DUPLEX Referring Phys: Elease Etienne  --------------------------------------------------------------------------------  Indications: Left arm swelling, patient with left upper arm AV graft currently in use Risk Factors: 12-15-2021 Hemorrhage from left-sided AV graft secondary to TNK administration for stroke. Comparison Study: No prior studies. Performing Technologist: Darlin Coco RDMS, RVT  Examination Guidelines: A complete evaluation includes B-mode imaging, spectral Doppler, color Doppler, and power Doppler as needed of all accessible portions of each vessel. Bilateral testing is considered an integral part of a complete examination. Limited examinations for reoccurring indications may be performed as noted.  Right Findings: +----------+------------+---------+-----------+----------+-------+ RIGHT     CompressiblePhasicitySpontaneousPropertiesSummary +----------+------------+---------+-----------+----------+-------+ Subclavian               Yes       Yes                      +----------+------------+---------+-----------+----------+-------+  Left Findings: +----------+------------+---------+-----------+----------+---------------------+ LEFT      CompressiblePhasicitySpontaneousProperties       Summary        +----------+------------+---------+-----------+----------+---------------------+ IJV           Full       Yes       Yes                                    +----------+------------+---------+-----------+----------+---------------------+ Subclavian               Yes       Yes                                    +----------+------------+---------+-----------+----------+---------------------+ Axillary                 Yes       Yes               Unable to compress                                                           secondary to                                                          AVG/pressure gradient +----------+------------+---------+-----------+----------+---------------------+  Brachial      Full                                                        +----------+------------+---------+-----------+----------+---------------------+  Radial        Full                                                        +----------+------------+---------+-----------+----------+---------------------+ Ulnar         Full                                                        +----------+------------+---------+-----------+----------+---------------------+ Cephalic      Full                                                        +----------+------------+---------+-----------+----------+---------------------+ Basilic       Full                                                        +----------+------------+---------+-----------+----------+---------------------+  Summary:  Right: No evidence of thrombosis in the subclavian.  Left: No evidence of deep vein thrombosis in the upper extremity. No evidence of superficial vein thrombosis in the upper extremity. Approximately 4 cm x 1.5 cm peri-graft hypoechoic focal collection noted at the upper arm. Likely representing hematoma given history of hemorrhage post-TNK administration. Subcutaneous edema noted throughout extremity.  *See table(s) above for measurements and observations.  Diagnosing physician: Deitra Mayo MD Electronically signed by Deitra Mayo MD on 12/30/2021 at 11:16:48 PM.    Final    DG CHEST PORT 1 VIEW  Result Date: 12/30/2021 CLINICAL DATA:  Dyspnea EXAM: PORTABLE CHEST 1 VIEW COMPARISON:  Previous studies including the examination of 12/29/2021 FINDINGS: Transverse diameter of heart is increased. Calcification is seen in the region of mitral annulus. There is interval decrease in pulmonary vascular congestion. There are no signs of alveolar pulmonary edema. There is improvement in aeration of lower lung fields. There is small residual linear density in medial left lower lung field. There is no  pleural effusion or pneumothorax. Enteric tube is noted traversing the esophagus. IMPRESSION: Cardiomegaly. There is interval decrease in pulmonary vascular congestion and improvement in aeration in both lungs. Subsegmental atelectasis is seen in medial left lower lung field. Electronically Signed   By: Elmer Picker M.D.   On: 12/30/2021 08:15   DG CHEST PORT 1 VIEW  Result Date: 12/29/2021 CLINICAL DATA:  Dyspnea ROVER EXAM: PORTABLE CHEST - 1 VIEW COMPARISON:  12/25/2021 FINDINGS: Patient has been extubated. The feeding tube extends at least as far as the stomach, tip not seen. Lower lung volumes with crowding of perihilar bronchovascular structures. No confluent airspace disease or convincing edema. Heart size and mediastinal contours are within normal limits. No effusion. Right humerus orthopedic hardware. IMPRESSION: Low volumes post extubation. No acute findings. Electronically Signed   By: Lucrezia Europe M.D.   On: 12/29/2021 12:57   DG CHEST PORT 1 VIEW  Result Date: 12/25/2021 CLINICAL DATA:  Respiratory  failure EXAM: PORTABLE CHEST 1 VIEW COMPARISON:  Previous studies including the examination of 12/18/2021 FINDINGS: Transverse diameter of heart is slightly increased. There are no signs of pulmonary edema or new focal infiltrates. There is no pleural effusion or pneumothorax. Tip of endotracheal tube is 3.6 cm above the carina. Distal portion of enteric tube is seen in the stomach. IMPRESSION: There are no new infiltrates or signs of pulmonary edema. Electronically Signed   By: Elmer Picker M.D.   On: 12/25/2021 09:30   MR BRAIN WO CONTRAST  Result Date: 12/23/2021 CLINICAL DATA:  Seizure EXAM: MRI HEAD WITHOUT CONTRAST TECHNIQUE: Multiplanar, multiecho pulse sequences of the brain and surrounding structures were obtained without intravenous contrast. COMPARISON:  12/16/2021 FINDINGS: Evaluation is somewhat limited by motion artifact. Brain: Increased cortical signal on  diffusion-weighted imaging in the left frontoparietal region without definite ADC correlate (series 5, image 85), which correlates with increased T2 signal (series 11, image 17), new compared to 12/16/2021. No acute hemorrhage, mass, mass effect, or midline shift. No hydrocephalus or extra-axial collection. Scattered T2 hyperintense signal in the periventricular white matter, likely the sequela of mild-to-moderate chronic small vessel ischemic disease. The hippocampi are symmetric in size and signal. No heterotopia or evidence of cortical dysgenesis. Vascular: Normal arterial flow voids. Skull and upper cervical spine: Normal marrow signal. Sinuses/Orbits: No acute finding. Other: Fluid in the bilateral mastoid air cells. IMPRESSION: 1. Increased cortical signal on diffusion-weighted sequence and T2 weighted sequences in the left frontoparietal region, new compared to 12/16/2021, favored to represent sequela of a recent seizure, given the distribution. 2. No other acute intracranial process. These results will be called to the ordering clinician or representative by the Radiologist Assistant, and communication documented in the PACS or [employer]. Electronically Signed   By: Merilyn Baba M.D.   On: 12/23/2021 20:54   Portable Chest x-ray  Result Date: 12/18/2021 CLINICAL DATA:  Endotracheal tube placement. EXAM: PORTABLE CHEST 1 VIEW COMPARISON:  December 16, 2021. FINDINGS: Stable cardiomediastinal silhouette. Endotracheal tube is in grossly good position. Feeding tube is seen entering stomach. Lungs are clear. Bony thorax is unremarkable. IMPRESSION: Endotracheal tube in grossly good position. Feeding tube is seen entering stomach. Electronically Signed   By: Marijo Conception M.D.   On: 12/18/2021 16:30   Overnight EEG with video  Result Date: 12/18/2021 [name], MD     12/19/2021  9:31 AM Patient Name: Erica [name] MRN: [id number] Epilepsy Attending: [name] Referring  Physician/Provider: [name], MD Duration: 12/17/2021 1402 to 12/18/2021 1830  Patient history: 66 yo F with right hemiplegia with leftward gaze and aphasia.EEG to evaluate for seizure  Level of alertness: lethargic  AEDs during EEG study: LEV, LCM, PHT, Phenobarb  Technical aspects: This EEG study was done with scalp electrodes positioned according to the 10-20 International system of electrode placement. Electrical activity was reviewed with band pass filter of 1-_0 , sensitivity of 7 uV/mm, display speed of 36m/sec with a _1  notched filter applied as appropriate. EEG data were recorded continuously and digitally stored.  Video monitoring was available and reviewed as appropriate.  Description: EEG showed lateralized periodic discharges at 1 Hz in left posterior quadrant which increased in frequency and morphology and involved left hemisphere as well as vertex region. No clinical signs were seen during this EEG pattern except at around 2125 on 12/17/2021 patient was noted to have right upper extremity jerking.  This EEG pattern was consistent with focal status epilepticus arising  from left posterior quadrant which was mostly nonconvulsive except briefly at 2125 on 12/17/2021 when right upper extremity jerking was noted.  EEG also showed continuous generalized 3 to 4 Hz delta and delta slowing.  At around 1536, patient was intubated and started on propofol.  Subsequently, EEG showed continued to show focal nonconvulsive status epilepticus arising from left posterior quadrant Patient was unhooked from EEG on 12/18/2021 between 0011 to 0424. ABNORMALITY -Focal nonconvulsive status epilepticus, left posterior quadrant - Continuous slow, generalized  IMPRESSION: This study showed evidence of electrographic/nonconvulsive status epilepticus arising from left posterior quadrant.  Briefly at 2125 12/17/2021 patient was also noted to have right upper extremity jerking consistent with focal motor seizure. There was  also severe diffuse encephalopathy, nonspecific etiology.  Priyanka Barbra Sarks   DG Abd Portable 1V  Result Date: 12/17/2021 CLINICAL DATA:  Feeding tube placement EXAM: PORTABLE ABDOMEN - 1 VIEW COMPARISON:  09/22/2020 FINDINGS: Weighted tip feeding tube is been advanced to the level of the pylorus . Stomach is decompressed. Bowel gas pattern unremarkable. Right femoral catheter extends up to the L5 level. Right hip arthroplasty component partially visualized. IMPRESSION: Feeding tube advanced to the pylorus . Electronically Signed   By: Lucrezia Europe M.D.   On: 12/17/2021 11:54   MR BRAIN WO CONTRAST  Result Date: 12/16/2021 CLINICAL DATA:  Neuro deficit, acute, stroke suspected EXAM: MRI HEAD WITHOUT CONTRAST TECHNIQUE: Multiplanar, multiecho pulse sequences of the brain and surrounding structures were obtained without intravenous contrast. COMPARISON:  CT head December 15, 2021. MRI head August 16, 2020. FINDINGS: Brain: No acute infarction, hemorrhage, hydrocephalus, extra-axial collection or mass lesion. Mild for age scattered T2/FLAIR hyperintensities in the white matter, nonspecific but compatible with chronic microvascular ischemic disease. Remote left parietal infarct. Vascular: Major arterial flow voids are maintained skull base. Skull and upper cervical spine: Normal marrow signal. Sinuses/Orbits: Clear sinuses.  No acute orbital findings. Other: No mastoid effusions. IMPRESSION: No evidence of acute intracranial abnormality. Electronically Signed   By: Margaretha Sheffield M.D.   On: 12/16/2021 14:27   DG Chest Port 1 View  Result Date: 12/16/2021 CLINICAL DATA:  Altered mental status EXAM: PORTABLE CHEST 1 VIEW COMPARISON:  Radiograph 10/12/2021 FINDINGS: Mildly enlarged cardiac silhouette. There is no focal airspace consolidation. There is no pleural effusion or pneumothorax. Prior right proximal humerus fixation. Bilateral shoulder degenerative changes. Thoracic spondylosis. IMPRESSION: No evidence  of acute cardiopulmonary disease. Unchanged mild cardiomegaly. Electronically Signed   By: Maurine Simmering M.D.   On: 12/16/2021 10:13   EEG adult  Result Date: 12/16/2021 [name], MD     12/16/2021  8:42 AM Patient Name: ZANNAH MELUCCI MRN: [id number] Epilepsy Attending: [name] Referring Physician/Provider: Rigoberto Noel, MD Date: 12/16/2021 Duration: 23.25 mins Patient history: 66 yo F with right hemiplegia with leftward gaze and aphasia.EEG to evaluate for seizure Level of alertness: lethargic AEDs during EEG study: LEV Technical aspects: This EEG study was done with scalp electrodes positioned according to the 10-20 International system of electrode placement. Electrical activity was reviewed with band pass filter of 1-_0 , sensitivity of 7 uV/mm, display speed of 53m/sec with a _1  notched filter applied as appropriate. EEG data were recorded continuously and digitally stored.  Video monitoring was available and reviewed as appropriate. Description: EEG showed continuous generalized 3 to 6 Hz theta-delta slowing. Hyperventilation and photic stimulation were not performed.   Of note, study was technically difficult due to significant myogenic artifact ABNORMALITY - Continuous slow, generalized  IMPRESSION: This study is suggestive of moderate to severe diffuse encephalopathy, nonspecific etiology. No seizures or epileptiform discharges were seen throughout the recording. Priyanka Barbra Sarks   CT ANGIO HEAD NECK W WO CM W PERF (CODE STROKE)  Result Date: 12/15/2021 CLINICAL DATA:  Neuro deficit, acute, stroke suspected EXAM: CT ANGIOGRAPHY HEAD AND NECK CT PERFUSION BRAIN TECHNIQUE: Multidetector CT imaging of the head and neck was performed using the standard protocol during bolus administration of intravenous contrast. Multiplanar CT image reconstructions and MIPs were obtained to evaluate the vascular anatomy. Carotid stenosis measurements (when applicable) are obtained utilizing  NASCET criteria, using the distal internal carotid diameter as the denominator. Multiphase CT imaging of the brain was performed following IV bolus contrast injection. Subsequent parametric perfusion maps were calculated using RAPID software. RADIATION DOSE REDUCTION: This exam was performed according to the departmental dose-optimization program which includes automated exposure control, adjustment of the mA and/or kV according to patient size and/or use of iterative reconstruction technique. CONTRAST:  11m OMNIPAQUE IOHEXOL 350 MG/ML SOLN COMPARISON:  None Available. FINDINGS: CTA NECK FINDINGS Aortic arch: Standard branching. Imaged portion shows no evidence of aneurysm or dissection. No significant stenosis of the major arch vessel origins. Right carotid system: No evidence of dissection, stenosis (50% or greater), or occlusion. Left carotid system: No evidence of dissection, stenosis (50% or greater), or occlusion. Vertebral arteries: Right dominant. No evidence of dissection, stenosis (50% or greater), or occlusion. Skeleton: Multilevel degenerative change including central disc protrusion at C3-C4. Other neck: No acute findings. Upper chest: Visualized lung apices are clear. Review of the MIP images confirms the above findings CTA HEAD FINDINGS Anterior circulation: Bilateral intracranial ICAs, MCAs, and ACAs are patent without proximal hemodynamically significant stenosis. Posterior circulation: Bilateral intradural vertebral arteries, basilar artery and bilateral posterior cerebral arteries are patent without proximal hemodynamically significant stenosis. Venous sinuses: As permitted by contrast timing, patent. CT Brain Perfusion Findings: CBF (<30%) Volume: 085mPerfusion (Tmax>6.0s) volume: 14m72mismatch Volume: 14mL38mfarction Location:None IMPRESSION: 1. No emergent large vessel occlusion or proximal hemodynamically significant stenosis. 2. Normal CT perfusion. Findings discussed with Dr. KirkLeonel Ramsay  telephone 2:17 p.m. Electronically Signed   By: FredMargaretha Sheffield.   On: 12/15/2021 14:23   CT HEAD CODE STROKE WO CONTRAST  Result Date: 12/15/2021 CLINICAL DATA:  Code stroke. Neuro deficit, acute, stroke suspected a series call [phone]: CT HEAD WITHOUT CONTRAST TECHNIQUE: Contiguous axial images were obtained from the base of the skull through the vertex without intravenous contrast. RADIATION DOSE REDUCTION: This exam was performed according to the departmental dose-optimization program which includes automated exposure control, adjustment of the mA and/or kV according to patient size and/or use of iterative reconstruction technique. COMPARISON:  CT head 10/12/2021. FINDINGS: Brain: No evidence of acute infarction, hemorrhage, hydrocephalus, extra-axial collection or mass lesion/mass effect. Patchy white matter hypodensities, nonspecific but compatible with chronic microvascular ischemic disease. Vascular: No hyperdense vessel identified. Skull: No acute fracture. Sinuses/Orbits: Visualized sinuses are clear. No acute orbital findings. Other: No mastoid effusions. ASPECTS ([facility] Early CT Score) total score (0-10 with 10 being normal): 10. IMPRESSION: 1. No evidence of acute intracranial abnormality. 2. ASPECTS is 10. Code stroke imaging results were communicated on 12/15/2021 at 1:36 pm to provider PattSharlett Iles telephone, who verbally acknowledged these results. Electronically Signed   By: FredMargaretha Sheffield.   On: 12/15/2021 13:37     Subjective: No acute issues/events overnight   Discharge Exam: Vitals:   12/31/21 0800 12/31/21  0900  BP: (!) 167/77 (!) 174/69  Pulse: 97 97  Resp: (!) 22 (!) 26  Temp:    SpO2: 100% 100%   Vitals:   12/31/21 0700 12/31/21 0736 12/31/21 0800 12/31/21 0900  BP: (!) 142/69  (!) 167/77 (!) 174/69  Pulse: 96  97 97  Resp: (!) 25  (!) 22 (!) 26  Temp:  99.6 F (37.6 C)    TempSrc:  Axillary    SpO2: 100%  100% 100%   Weight:      Height:       General:  chronically ill-appearing, resting in bed in no acute distress, follows simple commands HEENT: PERRLA, without icterus  Neuro: eyes open, alert, disoriented, nods to questions, able to squeeze eyes to command but not much else, ROM limited CV: s1s2 rrr, no m/r/g PULM:  clear bilaterally  GI: soft, bsx4 active  Extremities: warm/dry, LUE edema noted Skin: no rashes or lesions   The results of significant diagnostics from this hospitalization (including imaging, microbiology, ancillary and laboratory) are listed below for reference.     Microbiology: Recent Results (from the past 240 hour(s))  Culture, blood (Routine X 2) w Reflex to ID Panel     Status: None   Collection Time: 12/25/21  9:27 AM   Specimen: BLOOD RIGHT ARM  Result Value Ref [place] Status   Specimen Description BLOOD RIGHT ARM  Final   Special Requests   Final    BOTTLES DRAWN AEROBIC AND ANAEROBIC Blood Culture adequate volume   Culture   Final    NO GROWTH 5 DAYS Performed at [facility], [address] [place], [place] [address]    Report Status 12/30/2021 FINAL  Final  Culture, blood (Routine X 2) w Reflex to ID Panel     Status: None   Collection Time: 12/25/21  9:27 AM   Specimen: BLOOD RIGHT ARM  Result Value Ref [place] Status   Specimen Description BLOOD RIGHT ARM  Final   Special Requests   Final    BOTTLES DRAWN AEROBIC AND ANAEROBIC Blood Culture adequate volume   Culture   Final    NO GROWTH 5 DAYS Performed at [facility], [place] [address], [place], [place] [address]    Report Status 12/30/2021 FINAL  Final  Culture, Respiratory w Gram Stain     Status: None   Collection Time: 12/25/21  8:29 PM   Specimen: Tracheal Aspirate  Result Value Ref [place] Status   Specimen Description TRACHEAL ASPIRATE  Final   Special Requests NONE  Final   Gram Stain   Final    RARE SQUAMOUS EPITHELIAL CELLS PRESENT FEW WBC PRESENT,BOTH PMN AND MONONUCLEAR FEW  GRAM VARIABLE ROD    Culture   Final    FEW Normal respiratory flora-no Staph aureus or Pseudomonas seen Performed at [facility], [place] [address], [place], [place] [address]    Report Status 12/28/2021 FINAL  Final  Culture, blood (Routine X 2) w Reflex to ID Panel     Status: None (Preliminary result)   Collection Time: 12/29/21  4:08 PM   Specimen: BLOOD RIGHT HAND  Result Value Ref [place] Status   Specimen Description BLOOD RIGHT HAND  Final   Special Requests IN PEDIATRIC BOTTLE Blood Culture adequate volume  Final   Culture   Final    NO GROWTH < 24 HOURS Performed at [facility], [place] [address] [place], [place] [address]    Report  Status PENDING  Incomplete  Culture, blood (Routine X 2) w Reflex to ID Panel     Status: None (Preliminary result)   Collection Time: 12/29/21  4:08 PM   Specimen: BLOOD RIGHT ARM  Result Value [place] Status   Specimen Description BLOOD RIGHT ARM  Final   Special Requests IN PEDIATRIC BOTTLE Blood Culture adequate volume  Final   Culture   Final    NO GROWTH < 24 HOURS Performed at [facility], [place] [address], [place], [place] [address]    Report Status PENDING  Incomplete     Labs: BNP (last 3 results) No results for input(s): "BNP" in the last 8760 hours. Basic Metabolic Panel: Recent Labs  Lab 12/24/21 1231 12/25/21 0327 12/26/21 1435 12/27/21 0212 12/29/21 1256 12/29/21 1749 12/30/21 0616 12/31/21 0458  NA 129* 129* 130* 132* 128* 126* 134* 131*  K 4.3 3.6 4.2 4.0 5.9* 6.1* 4.2 5.0  CL 90* 90* 94* 94* 88*  --  90* 92*  CO2 _0 19*  --  26 23  GLUCOSE 184* 160* 154* 193* 178*  --  71 125*  BUN 52* 35* 59* 32* 83*  --  46* 78*  CREATININE 2.84* 1.85* 2.73* 1.69* 3.42*  --  2.02* 2.90*  CALCIUM 8.8* 8.4* 8.6* 8.6* 9.3  --  9.0 9.4  MG  --  1.7  --   --  1.9  --   --   --   PHOS 3.5 2.6 3.9  --   --   --   --   --    Liver Function Tests: Recent Labs  Lab 12/24/21 1231 12/26/21 1435  12/27/21 0212  AST  --   --  84*  ALT  --   --  82*  ALKPHOS  --   --  478*  BILITOT  --   --  2.8*  PROT  --   --  5.9*  ALBUMIN 2.1* 1.9* 2.1*   No results for input(s): "LIPASE", "AMYLASE" in the last 168 hours. No results for input(s): "AMMONIA" in the last 168 hours. CBC: Recent Labs  Lab 12/25/21 0355 12/26/21 1435 12/27/21 0212 12/29/21 1256 12/29/21 1749 12/30/21 0616 12/31/21 0458  WBC 18.6* 15.3* 15.1* 15.4*  --  14.5* 13.8*  NEUTROABS 15.6*  --  12.8* 12.4*  --  11.5* 10.1*  HGB 7.5* 6.7* 8.4* 8.3* 9.2* 8.1* 7.7*  HCT 23.4* 20.7* 26.4* 24.9* 27.0* 23.9* 23.3*  MCV 99.2 99.0 95.3 91.9  --  92.6 94.3  PLT 140* 174 192 281  --  279 PLATELET CLUMPS NOTED ON SMEAR, UNABLE TO ESTIMATE   Cardiac Enzymes: No results for input(s): "CKTOTAL", "CKMB", "CKMBINDEX", "TROPONINI" in the last 168 hours. BNP: Invalid input(s): "POCBNP" CBG: Recent Labs  Lab 12/30/21 1631 12/30/21 1952 12/30/21 2337 12/31/21 0344 12/31/21 0734  GLUCAP 251* 250* 146* 121* 149*   D-Dimer No results for input(s): "DDIMER" in the last 72 hours. Hgb A1c No results for input(s): "HGBA1C" in the last 72 hours. Lipid Profile No results for input(s): "CHOL", "HDL", "LDLCALC", "TRIG", "CHOLHDL", "LDLDIRECT" in the last 72 hours. Thyroid function studies No results for input(s): "TSH", "T4TOTAL", "T3FREE", "THYROIDAB" in the last 72 hours.  Invalid input(s): "FREET3" Anemia work up No results for input(s): "VITAMINB12", "FOLATE", "FERRITIN", "TIBC", "IRON", "RETICCTPCT" in the last 72 hours. Urinalysis    Component Value Date/Time   COLORURINE YELLOW 09/27/2019 1310   APPEARANCEUR TURBID (A) 09/27/2019 1310  LABSPEC 1.013 09/27/2019 1310   PHURINE 7.0 09/27/2019 1310   GLUCOSEU 50 (A) 09/27/2019 1310   HGBUR SMALL (A) 09/27/2019 1310   BILIRUBINUR NEGATIVE 09/27/2019 1310   KETONESUR NEGATIVE 09/27/2019 1310   PROTEINUR >=300 (A) 09/27/2019 1310   UROBILINOGEN 0.2 07/25/2014 2309    NITRITE NEGATIVE 09/27/2019 1310   LEUKOCYTESUR MODERATE (A) 09/27/2019 1310   Sepsis Labs Recent Labs  Lab 12/27/21 0212 12/29/21 1256 12/30/21 0616 12/31/21 0458  WBC 15.1* 15.4* 14.5* 13.8*   Microbiology Recent Results (from the past 240 hour(s))  Culture, blood (Routine X 2) w Reflex to ID Panel     Status: None   Collection Time: 12/25/21  9:27 AM   Specimen: BLOOD RIGHT ARM  Result Value Ref [place] Status   Specimen Description BLOOD RIGHT ARM  Final   Special Requests   Final    BOTTLES DRAWN AEROBIC AND ANAEROBIC Blood Culture adequate volume   Culture   Final    NO GROWTH 5 DAYS Performed at [facility], [address] [place], [place] [address]    Report Status 12/30/2021 FINAL  Final  Culture, blood (Routine X 2) w Reflex to ID Panel     Status: None   Collection Time: 12/25/21  9:27 AM   Specimen: BLOOD RIGHT ARM  Result Value Ref [place] Status   Specimen Description BLOOD RIGHT ARM  Final   Special Requests   Final    BOTTLES DRAWN AEROBIC AND ANAEROBIC Blood Culture adequate volume   Culture   Final    NO GROWTH 5 DAYS Performed at [facility], [place] [address], [place], [place] [address]    Report Status 12/30/2021 FINAL  Final  Culture, Respiratory w Gram Stain     Status: None   Collection Time: 12/25/21  8:29 PM   Specimen: Tracheal Aspirate  Result Value Ref [place] Status   Specimen Description TRACHEAL ASPIRATE  Final   Special Requests NONE  Final   Gram Stain   Final    RARE SQUAMOUS EPITHELIAL CELLS PRESENT FEW WBC PRESENT,BOTH PMN AND MONONUCLEAR FEW GRAM VARIABLE ROD    Culture   Final    FEW Normal respiratory flora-no Staph aureus or Pseudomonas seen Performed at [facility], [place] [address] [place], [place] [address]    Report Status 12/28/2021 FINAL  Final  Culture, blood (Routine X 2) w Reflex to ID Panel     Status: None (Preliminary result)   Collection Time: 12/29/21  4:08 PM   Specimen: BLOOD RIGHT HAND   Result Value Ref [place] Status   Specimen Description BLOOD RIGHT HAND  Final   Special Requests IN PEDIATRIC BOTTLE Blood Culture adequate volume  Final   Culture   Final    NO GROWTH < 24 HOURS Performed at [facility], [place] [address], [place], [place] [address]    Report Status PENDING  Incomplete  Culture, blood (Routine X 2) w Reflex to ID Panel     Status: None (Preliminary result)   Collection Time: 12/29/21  4:08 PM   Specimen: BLOOD RIGHT ARM  Result Value Ref [place] Status   Specimen Description BLOOD RIGHT ARM  Final   Special Requests IN PEDIATRIC BOTTLE Blood Culture adequate volume  Final   Culture   Final    NO GROWTH < 24 HOURS Performed at [facility], [place] [address], [place], [place] [address]    Report Status PENDING  Incomplete  Time coordinating discharge: Over 30 minutes  SIGNED:   Little Ishikawa, DO [facility] 12/31/2021, 11:24 AM Pager   If 7PM-7AM, please contact night-coverage [email or web address]

## 2021-12-31 NOTE — Progress Notes (Signed)
Palliative:  I have reviewed records. Noted family has requested full scope but with recommendations from my colleague for continued discussion in the coming days. Overall prognosis noted to be poor with ongoing significant barriers to improvement. I will continue to follow and plan to make attempts to continue conversation with family tomorrow.   No charge  Vinie Sill, NP Palliative Medicine Team Pager [phone] (Please see [email or web address] for schedule) Team Phone [phone]

## 2022-01-01 NOTE — Progress Notes (Signed)
Nephrology note:  Noted patient is discharged to [facility]. Please consult [facility] Nephrology for dialysis.   Thanks.

## 2022-01-02 LAB — HEPATITIS B SURFACE ANTIBODY,QUALITATIVE: Hep B S Ab: REACTIVE — AB

## 2022-01-02 LAB — CBC
HCT: 25.8 % — ABNORMAL LOW (ref 36.0–46.0)
HCT: 24.1 % — ABNORMAL LOW (ref 36.0–46.0)
Hemoglobin: 8.1 g/dL — ABNORMAL LOW (ref 12.0–15.0)
Hemoglobin: 7.4 g/dL — ABNORMAL LOW (ref 12.0–15.0)
MCH: 30.9 pg (ref 26.0–34.0)
MCH: 30.6 pg (ref 26.0–34.0)
MCHC: 31.4 g/dL (ref 30.0–36.0)
MCHC: 30.7 g/dL (ref 30.0–36.0)
MCV: 98.5 fL (ref 80.0–100.0)
MCV: 99.6 fL (ref 80.0–100.0)
Platelets: 341 10*3/uL (ref 150–400)
Platelets: 371 10*3/uL (ref 150–400)
RBC: 2.62 MIL/uL — ABNORMAL LOW (ref 3.87–5.11)
RBC: 2.42 MIL/uL — ABNORMAL LOW (ref 3.87–5.11)
RDW: 20.4 % — ABNORMAL HIGH (ref 11.5–15.5)
RDW: 20.5 % — ABNORMAL HIGH (ref 11.5–15.5)
WBC: 10.7 10*3/uL — ABNORMAL HIGH (ref 4.0–10.5)
WBC: 13.3 10*3/uL — ABNORMAL HIGH (ref 4.0–10.5)
nRBC: 0 % (ref 0.0–0.2)
nRBC: 0 % (ref 0.0–0.2)

## 2022-01-02 LAB — DIC (DISSEMINATED INTRAVASCULAR COAGULATION)PANEL
D-Dimer, Quant: 12.13 ug/mL-FEU — ABNORMAL HIGH (ref 0.00–0.50)
Fibrinogen: 667 mg/dL — ABNORMAL HIGH (ref 210–475)
INR: 4.1 (ref 0.8–1.2)
Platelets: 369 10*3/uL (ref 150–400)
Prothrombin Time: 39.5 seconds — ABNORMAL HIGH (ref 11.4–15.2)
Smear Review: NONE SEEN
aPTT: 69 seconds — ABNORMAL HIGH (ref 24–36)

## 2022-01-02 LAB — HEPATITIS B SURFACE ANTIGEN: Hepatitis B Surface Ag: NONREACTIVE

## 2022-01-02 LAB — OCCULT BLOOD X 1 CARD TO LAB, STOOL: Fecal Occult Bld: POSITIVE — AB

## 2022-01-02 NOTE — Progress Notes (Signed)
[facility]  ROUNDING NOTE   Subjective:  Patient well-known to [place] as we follow her for outpatient hemodialysis in [facility]. She came in with status epilepticus and metabolic encephalopathy. She undergoes hemodialysis on TTS schedule as an outpatient. She has a left upper extremity AV access in place.   Objective:  Vital signs in last 24 hours:  Temperature 100.2 pulse 96 respirations 32 blood pressure 151/62  Physical Exam: General: Chronically ill-appearing  Head: Normocephalic, atraumatic. Moist oral mucosal membranes  Neck: Supple  Lungs:  Clear to auscultation, normal effort  Heart: S1S2 no rubs  Abdomen:  Soft, nontender, bowel sounds present  Extremities: No peripheral edema.  Neurologic: Awake, alert, following commands  Skin: No acute rash  Access: Left upper extremity AV graft    Basic Metabolic Panel: Recent Labs  Lab 12/26/21 1435 12/27/21 0212 12/29/21 1256 12/29/21 1749 12/30/21 0616 12/31/21 0458  NA 130* 132* 128* 126* 134* 131*  K 4.2 4.0 5.9* 6.1* 4.2 5.0  CL 94* 94* 88*  --  90* 92*  CO2 22 25 19*  --  26 23  GLUCOSE 154* 193* 178*  --  71 125*  BUN 59* 32* 83*  --  46* 78*  CREATININE 2.73* 1.69* 3.42*  --  2.02* 2.90*  CALCIUM 8.6* 8.6* 9.3  --  9.0 9.4  MG  --   --  1.9  --   --   --   PHOS 3.9  --   --   --   --   --     Liver Function Tests: Recent Labs  Lab 12/26/21 1435 12/27/21 0212  AST  --  84*  ALT  --  82*  ALKPHOS  --  478*  BILITOT  --  2.8*  PROT  --  5.9*  ALBUMIN 1.9* 2.1*   No results for input(s): "LIPASE", "AMYLASE" in the last 168 hours. No results for input(s): "AMMONIA" in the last 168 hours.  CBC: Recent Labs  Lab 12/27/21 0212 12/29/21 1256 12/29/21 1749 12/30/21 0616 12/31/21 0458 12/31/21 1500  WBC 15.1* 15.4*  --  14.5* 13.8* 12.7*  NEUTROABS 12.8* 12.4*  --  11.5* 10.1*  --   HGB 8.4* 8.3* 9.2* 8.1* 7.7* 7.2*  HCT 26.4* 24.9* 27.0* 23.9* 23.3* 22.6*  MCV 95.3 91.9  --   92.6 94.3 95.4  PLT 192 281  --  279 PLATELET CLUMPS NOTED ON SMEAR, UNABLE TO ESTIMATE 330    Cardiac Enzymes: No results for input(s): "CKTOTAL", "CKMB", "CKMBINDEX", "TROPONINI" in the last 168 hours.  BNP: Invalid input(s): "POCBNP"  CBG: Recent Labs  Lab 12/30/21 1952 12/30/21 2337 12/31/21 0344 12/31/21 0734 12/31/21 1238  GLUCAP 250* 146* 121* 149* 49*    Microbiology: Results for orders placed or performed during the hospital encounter of 12/15/21  MRSA Next Gen by PCR, Nasal     Status: None   Collection Time: 12/15/21  9:35 PM   Specimen: Nasal Mucosa; Nasal Swab  Result Value Ref [place] Status   MRSA by PCR Next Gen NOT DETECTED NOT DETECTED Final    Comment: (NOTE) The GeneXpert MRSA Assay (FDA approved for NASAL specimens only), is one component of a comprehensive MRSA colonization surveillance program. It is not intended to diagnose MRSA infection nor to guide or monitor treatment for MRSA infections. Test performance is not FDA approved in patients less than 44 years old. Performed at [facility], [place] [address] [place], [place] [address]  Culture, blood (Routine X 2) w Reflex to ID Panel     Status: None   Collection Time: 12/16/21  6:51 PM   Specimen: BLOOD RIGHT HAND  Result Value Ref [place] Status   Specimen Description BLOOD RIGHT HAND  Final   Special Requests   Final    BOTTLES DRAWN AEROBIC ONLY Blood Culture results may not be optimal due to an inadequate volume of blood received in culture bottles   Culture   Final    NO GROWTH 5 DAYS Performed at [facility], [place] [address] [place], [place] [address]    Report Status 12/21/2021 FINAL  Final  Culture, blood (Routine X 2) w Reflex to ID Panel     Status: None   Collection Time: 12/16/21  6:58 PM   Specimen: BLOOD RIGHT ARM  Result Value Ref [place] Status   Specimen Description BLOOD RIGHT ARM  Final   Special Requests   Final    BOTTLES DRAWN AEROBIC ONLY Blood Culture  results may not be optimal due to an inadequate volume of blood received in culture bottles   Culture   Final    NO GROWTH 5 DAYS Performed at [facility], [place] [address], [place], [place] [address]    Report Status 12/21/2021 FINAL  Final  Culture, blood (Routine X 2) w Reflex to ID Panel     Status: None   Collection Time: 12/25/21  9:27 AM   Specimen: BLOOD RIGHT ARM  Result Value Ref [place] Status   Specimen Description BLOOD RIGHT ARM  Final   Special Requests   Final    BOTTLES DRAWN AEROBIC AND ANAEROBIC Blood Culture adequate volume   Culture   Final    NO GROWTH 5 DAYS Performed at [facility], [place] [address], [place], [place] [address]    Report Status 12/30/2021 FINAL  Final  Culture, blood (Routine X 2) w Reflex to ID Panel     Status: None   Collection Time: 12/25/21  9:27 AM   Specimen: BLOOD RIGHT ARM  Result Value Ref [place] Status   Specimen Description BLOOD RIGHT ARM  Final   Special Requests   Final    BOTTLES DRAWN AEROBIC AND ANAEROBIC Blood Culture adequate volume   Culture   Final    NO GROWTH 5 DAYS Performed at [facility], [place] [address], [place], [place] [address]    Report Status 12/30/2021 FINAL  Final  Culture, Respiratory w Gram Stain     Status: None   Collection Time: 12/25/21  8:29 PM   Specimen: Tracheal Aspirate  Result Value Ref [place] Status   Specimen Description TRACHEAL ASPIRATE  Final   Special Requests NONE  Final   Gram Stain   Final    RARE SQUAMOUS EPITHELIAL CELLS PRESENT FEW WBC PRESENT,BOTH PMN AND MONONUCLEAR FEW GRAM VARIABLE ROD    Culture   Final    FEW Normal respiratory flora-no Staph aureus or Pseudomonas seen Performed at [facility], [place] [address], [place], [place] [address]    Report Status 12/28/2021 FINAL  Final  Culture, blood (Routine X 2) w Reflex to ID Panel     Status: None (Preliminary result)   Collection Time: 12/29/21  4:08 PM   Specimen: BLOOD RIGHT HAND  Result Value Ref  [place] Status   Specimen Description BLOOD RIGHT HAND  Final   Special Requests IN PEDIATRIC BOTTLE Blood Culture adequate volume  Final  Culture   Final    NO GROWTH 4 DAYS Performed at [facility], [place] [address] [place], [place] [address]    Report Status PENDING  Incomplete  Culture, blood (Routine X 2) w Reflex to ID Panel     Status: None (Preliminary result)   Collection Time: 12/29/21  4:08 PM   Specimen: BLOOD RIGHT ARM  Result Value Ref [place] Status   Specimen Description BLOOD RIGHT ARM  Final   Special Requests IN PEDIATRIC BOTTLE Blood Culture adequate volume  Final   Culture   Final    NO GROWTH 4 DAYS Performed at [facility], [place] [address] [place],  [address]    Report Status PENDING  Incomplete    Coagulation Studies: Recent Labs    12/31/21 0458  LABPROT 27.2*  INR 2.6*    Urinalysis: No results for input(s): "COLORURINE", "LABSPEC", "PHURINE", "GLUCOSEU", "HGBUR", "BILIRUBINUR", "KETONESUR", "PROTEINUR", "UROBILINOGEN", "NITRITE", "LEUKOCYTESUR" in the last 72 hours.  Invalid input(s): "APPERANCEUR"    Imaging: DG Abd Portable 1V  Result Date: 12/31/2021 CLINICAL DATA:  Impaired nasogastric tube function. EXAM: PORTABLE ABDOMEN - 1 VIEW COMPARISON:  None Available. FINDINGS: The bowel gas pattern is normal. No radio-opaque calculi or other significant radiographic abnormality are seen. NG tube with distal tip projecting over the body of the stomach. Bibasilar atelectasis. IMPRESSION: NG tube with distal tip projecting over the body of the stomach. Electronically Signed   By: Keane Police D.O.   On: 12/31/2021 23:34     Medications:       Assessment/ Plan:  66 y.o. female with past medical history of hypertension, hyperlipidemia, CHF, seizure disorder with history of recent status epilepticus, diabetes mellitus type 2 with peripheral neuropathy, ESRD on HD TTS as an outpatient, anemia of chronic kidney disease, secondary  hyperparathyroidism, generalized debility, sacral decubitus ulcer, acute hypoxemic respiratory failure, lobar pneumonia, severe protein calorie malnutrition who was admitted for ongoing rehabilitation, nutritional support, and dialysis treatment.  1.  ESRD on HD.  Patient was receiving dialysis on TTS schedule as an outpatient.  For now we will maintain the patient on MWF dialysis treatment schedule.  She has left upper extremity AV access in place.  2.  Anemia of chronic kidney disease.  Lab Results  Component Value Date   HGB 7.2 (L) 12/31/2021   Hemoglobin low at 7.2.  We will start the patient on Retacrit 30,000 units subcutaneous weekly.  3.  Secondary hyperparathyroidism.  We will monitor PTH, phosphorus, and calcium levels over the course of hospitalization.  4.  Fever.  Work-up as per primary team.   LOS: 0 Marquell Saenz 11/17/20238:53 AM

## 2022-01-03 LAB — CBC
HCT: 21.6 % — ABNORMAL LOW (ref 36.0–46.0)
HCT: 22.2 % — ABNORMAL LOW (ref 36.0–46.0)
HCT: 26.1 % — ABNORMAL LOW (ref 36.0–46.0)
Hemoglobin: 6.7 g/dL — CL (ref 12.0–15.0)
Hemoglobin: 7 g/dL — ABNORMAL LOW (ref 12.0–15.0)
Hemoglobin: 8.2 g/dL — ABNORMAL LOW (ref 12.0–15.0)
MCH: 30.7 pg (ref 26.0–34.0)
MCH: 30.9 pg (ref 26.0–34.0)
MCH: 32.3 pg (ref 26.0–34.0)
MCHC: 30.2 g/dL (ref 30.0–36.0)
MCHC: 31.4 g/dL (ref 30.0–36.0)
MCHC: 32.4 g/dL (ref 30.0–36.0)
MCV: 101.8 fL — ABNORMAL HIGH (ref 80.0–100.0)
MCV: 98.5 fL (ref 80.0–100.0)
MCV: 99.5 fL (ref 80.0–100.0)
Platelets: 377 10*3/uL (ref 150–400)
Platelets: 378 10*3/uL (ref 150–400)
Platelets: 383 10*3/uL (ref 150–400)
RBC: 2.17 MIL/uL — ABNORMAL LOW (ref 3.87–5.11)
RBC: 2.18 MIL/uL — ABNORMAL LOW (ref 3.87–5.11)
RBC: 2.65 MIL/uL — ABNORMAL LOW (ref 3.87–5.11)
RDW: 20.5 % — ABNORMAL HIGH (ref 11.5–15.5)
RDW: 20.6 % — ABNORMAL HIGH (ref 11.5–15.5)
RDW: 21.2 % — ABNORMAL HIGH (ref 11.5–15.5)
WBC: 11.7 10*3/uL — ABNORMAL HIGH (ref 4.0–10.5)
WBC: 12.5 10*3/uL — ABNORMAL HIGH (ref 4.0–10.5)
WBC: 13.8 10*3/uL — ABNORMAL HIGH (ref 4.0–10.5)
nRBC: 0 % (ref 0.0–0.2)
nRBC: 0 % (ref 0.0–0.2)
nRBC: 0 % (ref 0.0–0.2)

## 2022-01-03 LAB — BASIC METABOLIC PANEL
Anion gap: 14 (ref 5–15)
BUN: 26 mg/dL — ABNORMAL HIGH (ref 8–23)
CO2: 27 mmol/L (ref 22–32)
Calcium: 9.1 mg/dL (ref 8.9–10.3)
Chloride: 96 mmol/L — ABNORMAL LOW (ref 98–111)
Creatinine, Ser: 1.76 mg/dL — ABNORMAL HIGH (ref 0.44–1.00)
GFR, Estimated: 31 mL/min — ABNORMAL LOW (ref >60)
Glucose, Bld: 123 mg/dL — ABNORMAL HIGH (ref 70–99)
Potassium: 3.2 mmol/L — ABNORMAL LOW (ref 3.5–5.1)
Sodium: 137 mmol/L (ref 135–145)

## 2022-01-03 LAB — PHOSPHORUS: Phosphorus: 3.1 mg/dL (ref 2.5–4.6)

## 2022-01-03 LAB — CULTURE, BLOOD (ROUTINE X 2)
Culture: NO GROWTH
Culture: NO GROWTH
Special Requests: ADEQUATE
Special Requests: ADEQUATE

## 2022-01-03 LAB — PROTIME-INR
INR: 3.9 — ABNORMAL HIGH (ref 0.8–1.2)
Prothrombin Time: 38.2 seconds — ABNORMAL HIGH (ref 11.4–15.2)

## 2022-01-03 LAB — PREPARE RBC (CROSSMATCH)

## 2022-01-03 LAB — MAGNESIUM: Magnesium: 2 mg/dL (ref 1.7–2.4)

## 2022-01-04 ENCOUNTER — Other Ambulatory Visit (HOSPITAL_COMMUNITY): Payer: Medicare Other

## 2022-01-04 LAB — TYPE AND SCREEN
ABO/RH(D): B POS
Antibody Screen: NEGATIVE
Unit division: 0

## 2022-01-04 LAB — PTH, INTACT AND CALCIUM
Calcium, Total (PTH): 9.1 mg/dL (ref 8.7–10.3)
PTH: 121 pg/mL — ABNORMAL HIGH (ref 15–65)

## 2022-01-04 LAB — BPAM RBC
Blood Product Expiration Date: 202312042359
ISSUE DATE / TIME: 202311180358
Unit Type and Rh: 7300

## 2022-01-05 ENCOUNTER — Inpatient Hospital Stay
Admit: 2022-01-05 | Discharge: 2022-01-08 | Disposition: A | Discharge status: Other Acute Inpatient Hospital | Payer: Medicare Other

## 2022-01-05 ENCOUNTER — Ambulatory Visit (HOSPITAL_COMMUNITY): Admit: 2022-01-05 | Payer: Medicare Other | Admitting: Vascular Surgery

## 2022-01-05 ENCOUNTER — Ambulatory Visit (HOSPITAL_COMMUNITY)
Admission: RE | Admit: 2022-01-05 | Discharge: 2022-01-05 | Disposition: A | Discharge status: Other Acute Inpatient Hospital | Payer: Medicare Other | Attending: Vascular Surgery | Admitting: Vascular Surgery

## 2022-01-05 ENCOUNTER — Encounter (HOSPITAL_COMMUNITY)
Admission: RE | Disposition: A | Discharge status: Other Acute Inpatient Hospital | Payer: Self-pay | Attending: Vascular Surgery

## 2022-01-05 DIAGNOSIS — F17220 Nicotine dependence, chewing tobacco, uncomplicated: Secondary | ICD-10-CM | POA: Insufficient documentation

## 2022-01-05 DIAGNOSIS — T829XXA Unspecified complication of cardiac and vascular prosthetic device, implant and graft, initial encounter: Secondary | ICD-10-CM | POA: Insufficient documentation

## 2022-01-05 DIAGNOSIS — N186 End stage renal disease: Secondary | ICD-10-CM | POA: Insufficient documentation

## 2022-01-05 DIAGNOSIS — Z794 Long term (current) use of insulin: Secondary | ICD-10-CM | POA: Insufficient documentation

## 2022-01-05 DIAGNOSIS — T82838A Hemorrhage of vascular prosthetic devices, implants and grafts, initial encounter: Secondary | ICD-10-CM | POA: Diagnosis not present

## 2022-01-05 DIAGNOSIS — Y832 Surgical operation with anastomosis, bypass or graft as the cause of abnormal reaction of the patient, or of later complication, without mention of misadventure at the time of the procedure: Secondary | ICD-10-CM | POA: Insufficient documentation

## 2022-01-05 DIAGNOSIS — Z992 Dependence on renal dialysis: Secondary | ICD-10-CM | POA: Insufficient documentation

## 2022-01-05 HISTORY — PX: A/V FISTULAGRAM: CATH118298

## 2022-01-05 HISTORY — PX: PERIPHERAL VASCULAR INTERVENTION: CATH118257

## 2022-01-05 LAB — BASIC METABOLIC PANEL
Anion gap: 21 — ABNORMAL HIGH (ref 5–15)
BUN: 56 mg/dL — ABNORMAL HIGH (ref 8–23)
CO2: 21 mmol/L — ABNORMAL LOW (ref 22–32)
Calcium: 9.5 mg/dL (ref 8.9–10.3)
Chloride: 95 mmol/L — ABNORMAL LOW (ref 98–111)
Creatinine, Ser: 3.55 mg/dL — ABNORMAL HIGH (ref 0.44–1.00)
GFR, Estimated: 14 mL/min — ABNORMAL LOW (ref >60)
Glucose, Bld: 91 mg/dL (ref 70–99)
Potassium: 4.1 mmol/L (ref 3.5–5.1)
Sodium: 137 mmol/L (ref 135–145)

## 2022-01-05 LAB — MAGNESIUM: Magnesium: 2.3 mg/dL (ref 1.7–2.4)

## 2022-01-05 LAB — CBC
HCT: 24.5 % — ABNORMAL LOW (ref 36.0–46.0)
Hemoglobin: 7.8 g/dL — ABNORMAL LOW (ref 12.0–15.0)
MCH: 30.8 pg (ref 26.0–34.0)
MCHC: 31.8 g/dL (ref 30.0–36.0)
MCV: 96.8 fL (ref 80.0–100.0)
Platelets: 392 10*3/uL (ref 150–400)
RBC: 2.53 MIL/uL — ABNORMAL LOW (ref 3.87–5.11)
RDW: 21 % — ABNORMAL HIGH (ref 11.5–15.5)
WBC: 13.7 10*3/uL — ABNORMAL HIGH (ref 4.0–10.5)
nRBC: 0.1 % (ref 0.0–0.2)

## 2022-01-05 LAB — PHOSPHORUS: Phosphorus: 4.9 mg/dL — ABNORMAL HIGH (ref 2.5–4.6)

## 2022-01-05 SURGERY — A/V FISTULAGRAM
Anesthesia: LOCAL

## 2022-01-05 MED ORDER — IODIXANOL 320 MG/ML IV SOLN
INTRAVENOUS | Status: DC | PRN
  Administered 2022-01-05: 40 mL

## 2022-01-05 MED ORDER — LIDOCAINE HCL (PF) 1 % IJ SOLN
INTRAMUSCULAR | Status: AC
  Filled 2022-01-05: qty 30

## 2022-01-05 MED ORDER — HEPARIN (PORCINE) IN NACL 1000-0.9 UT/500ML-% IV SOLN
INTRAVENOUS | Status: AC
  Filled 2022-01-05: qty 500

## 2022-01-05 MED ORDER — HEPARIN (PORCINE) IN NACL 1000-0.9 UT/500ML-% IV SOLN
INTRAVENOUS | Status: DC | PRN
  Administered 2022-01-05: 500 mL

## 2022-01-05 SURGICAL SUPPLY — 17 items
BAG SNAP BAND KOVER 36X36 (MISCELLANEOUS) ×2 IMPLANT
BNDG HMST LF TOP THROMBIX (HEMOSTASIS) ×2
CATH ANGIO 5F BER2 65CM (CATHETERS) IMPLANT
COVER DOME SNAP 22 D (MISCELLANEOUS) ×2 IMPLANT
KIT MICROPUNCTURE NIT STIFF (SHEATH) IMPLANT
PATCH THROMBIX TOPICAL PLAIN (HEMOSTASIS) IMPLANT
PROTECTION STATION PRESSURIZED (MISCELLANEOUS) ×4
SHEATH PINNACLE R/O II 7F 4CM (SHEATH) IMPLANT
SHEATH PROBE COVER 6X72 (BAG) ×2 IMPLANT
STATION PROTECTION PRESSURIZED (MISCELLANEOUS) ×2 IMPLANT
STENT VIABAHN 7X50X120 (Permanent Stent) ×6 IMPLANT
STENT VIABAHN 7X5X120 7FR (Permanent Stent) IMPLANT
STOPCOCK MORSE 400PSI 3WAY (MISCELLANEOUS) ×2 IMPLANT
TRAY PV CATH (CUSTOM PROCEDURE TRAY) ×2 IMPLANT
TUBING CIL FLEX 10 FLL-RA (TUBING) ×2 IMPLANT
WIRE BENTSON .035X145CM (WIRE) IMPLANT
WIRE G V18X300CM (WIRE) IMPLANT

## 2022-01-05 NOTE — Progress Notes (Signed)
[facility]  ROUNDING NOTE   Subjective:  Patient seen and evaluated during hemodialysis treatment this AM. Appears to be tolerating well. Ultrafiltration target 2 kg.  Objective:  Vital signs in last 24 hours:  Temperature 99.2 pulse 88 respirations 31 blood pressure 147/70  Physical Exam: General: Chronically ill-appearing  Head: Normocephalic, atraumatic. Moist oral mucosal membranes  Neck: Supple  Lungs:  Clear to auscultation, normal effort  Heart: S1S2 no rubs  Abdomen:  Soft, nontender, bowel sounds present  Extremities: No peripheral edema.  Neurologic: Awake, alert, following commands  Skin: No acute rash  Access: Left upper extremity AV graft    Basic Metabolic Panel: Recent Labs  Lab 12/29/21 1256 12/29/21 1749 12/30/21 0616 12/31/21 0458 01/02/22 1142 01/03/22 0220 01/05/22 0144  NA 128* 126* 134* 131*  --  137 137  K 5.9* 6.1* 4.2 5.0  --  3.2* 4.1  CL 88*  --  90* 92*  --  96* 95*  CO2 19*  --  26 23  --  27 21*  GLUCOSE 178*  --  71 125*  --  123* 91  BUN 83*  --  46* 78*  --  26* 56*  CREATININE 3.42*  --  2.02* 2.90*  --  1.76* 3.55*  CALCIUM 9.3  --  9.0 9.4 9.1 9.1 9.5  MG 1.9  --   --   --   --  2.0 2.3  PHOS  --   --   --   --   --  3.1 4.9*     Liver Function Tests: No results for input(s): "AST", "ALT", "ALKPHOS", "BILITOT", "PROT", "ALBUMIN" in the last 168 hours.  No results for input(s): "LIPASE", "AMYLASE" in the last 168 hours. No results for input(s): "AMMONIA" in the last 168 hours.  CBC: Recent Labs  Lab 12/29/21 1256 12/29/21 1749 12/30/21 0616 12/31/21 0458 12/31/21 1500 01/02/22 1851 01/02/22 2349 01/03/22 0220 01/03/22 1048 01/05/22 0144  WBC 15.4*  --  14.5* 13.8*   < > 13.3* 13.8* 12.5* 11.7* 13.7*  NEUTROABS 12.4*  --  11.5* 10.1*  --   --   --   --   --   --   HGB 8.3*   < > 8.1* 7.7*   < > 7.4* 7.0* 6.7* 8.2* 7.8*  HCT 24.9*   < > 23.9* 23.3*   < > 24.1* 21.6* 22.2* 26.1* 24.5*  MCV 91.9  --   92.6 94.3   < > 99.6 99.5 101.8* 98.5 96.8  PLT 281  --  279 PLATELET CLUMPS NOTED ON SMEAR, UNABLE TO ESTIMATE   < > 371 383 378 377 392   < > = values in this interval not displayed.     Cardiac Enzymes: No results for input(s): "CKTOTAL", "CKMB", "CKMBINDEX", "TROPONINI" in the last 168 hours.  BNP: Invalid input(s): "POCBNP"  CBG: Recent Labs  Lab 12/30/21 1952 12/30/21 2337 12/31/21 0344 12/31/21 0734 12/31/21 1238  GLUCAP 250* 146* 121* 149* 23*     Microbiology: Results for orders placed or performed during the hospital encounter of 12/31/21  Culture, blood (Routine X 2) w Reflex to ID Panel     Status: None (Preliminary result)   Collection Time: 01/03/22  2:22 AM   Specimen: BLOOD  Result Value Ref Range Status   Specimen Description BLOOD BLOOD RIGHT HAND  Final   Special Requests   Final    BOTTLES DRAWN AEROBIC AND ANAEROBIC Blood Culture adequate volume  Culture   Final    NO GROWTH 2 DAYS Performed at [facility], [place] [address] [place], [place] [address]    Report Status PENDING  Incomplete  Culture, blood (Routine X 2) w Reflex to ID Panel     Status: None (Preliminary result)   Collection Time: 01/03/22  2:24 AM   Specimen: BLOOD  Result Value Ref Range Status   Specimen Description BLOOD BLOOD RIGHT HAND  Final   Special Requests   Final    BOTTLES DRAWN AEROBIC ONLY Blood Culture adequate volume   Culture   Final    NO GROWTH 2 DAYS Performed at [facility], [place] [address], [place], [place] [address]    Report Status PENDING  Incomplete  Culture, blood (Routine X 2) w Reflex to ID Panel     Status: None (Preliminary result)   Collection Time: 01/04/22  4:49 PM   Specimen: BLOOD RIGHT HAND  Result Value Ref Range Status   Specimen Description BLOOD RIGHT HAND  Final   Special Requests IN PEDIATRIC BOTTLE Blood Culture adequate volume  Final   Culture   Final    NO GROWTH < 24 HOURS Performed at [facility], [place] [address], [place], [place] [address]    Report Status PENDING  Incomplete    Coagulation Studies: Recent Labs    01/02/22 1631 01/03/22 0220  LABPROT 39.5* 38.2*  INR 4.1* 3.9*     Urinalysis: No results for input(s): "COLORURINE", "LABSPEC", "PHURINE", "GLUCOSEU", "HGBUR", "BILIRUBINUR", "KETONESUR", "PROTEINUR", "UROBILINOGEN", "NITRITE", "LEUKOCYTESUR" in the last 72 hours.  Invalid input(s): "APPERANCEUR"    Imaging: DG CHEST PORT 1 VIEW  Result Date: 01/04/2022 CLINICAL DATA:  Pneumonia EXAM: PORTABLE CHEST 1 VIEW COMPARISON:  Previous studies including the examination of 12/30/2021 FINDINGS: Transverse diameter of heart is increased. Calcification is seen in the region of mitral annulus. Central pulmonary vessels are prominent. There are no signs of alveolar pulmonary edema. Subtle increase in interstitial markings are seen in parahilar regions and lower lung fields. There is no focal pulmonary consolidation. There is minimal blunting of right lateral CP angle. Enteric tube is noted traversing the esophagus. IMPRESSION: Cardiomegaly. There is subtle increase in interstitial markings in parahilar regions and lower lung fields suggesting possible mild interstitial pulmonary edema. There is no focal pulmonary consolidation. Minimal right pleural effusion. Electronically Signed   By: Elmer Picker M.D.   On: 01/04/2022 16:04     Medications:       Assessment/ Plan:  66 y.o. female with past medical history of hypertension, hyperlipidemia, CHF, seizure disorder with history of recent status epilepticus, diabetes mellitus type 2 with peripheral neuropathy, ESRD on HD TTS as an outpatient, anemia of chronic kidney disease, secondary hyperparathyroidism, generalized debility, sacral decubitus ulcer, acute hypoxemic respiratory failure, lobar pneumonia, severe protein calorie malnutrition who was admitted for ongoing rehabilitation, nutritional support, and dialysis treatment.  1.   ESRD on HD.  Patient was receiving dialysis on TTS schedule as an outpatient.  Patient seen and evaluated during hemodialysis treatment.  Found to be tolerating well.  We plan to complete dialysis treatment today.  2.  Anemia of chronic kidney disease.  Lab Results  Component Value Date   HGB 7.8 (L) 01/05/2022   Hemoglobin up slightly to 7.8.  Continue Retacrit 30,000 units subcutaneous weekly.  3.  Secondary hyperparathyroidism. PTH Lab Results  Component Value Date   PTH 121 (H) 01/02/2022   PTH Comment 01/02/2022  CALCIUM 9.5 01/05/2022   CAION 1.17 12/29/2021   PHOS 4.9 (H) 01/05/2022  Phosphorus acceptable at 4.9.  PTH currently 121.  Continue to monitor bone metabolism parameters.      LOS: 0 Erica [name] 11/20/20238:46 AM

## 2022-01-05 NOTE — Op Note (Addendum)
    Patient name: Erica [name] MRN: [id number] DOB: 1955/04/05 Sex: female  01/05/2022 Pre-operative Diagnosis: End-stage renal disease, malfunction left arm AV graft Post-operative diagnosis:  Same Surgeon:  Luanna Salk. Randie Heinz, MD Procedure Performed: 1.  Ultrasound-guided cannulation left arm AV graft 2.  Left upper extremity fistulogram 3.  Stent of graft to venous anastomosis with 7 x 50 mm Viabahn 4.  Stent of vein graft stenosis at the site of pseudoaneurysmal degeneration with 7 x 50 mm Viabahn x2  Indications: 66 year old female with end-stage renal disease dialyzing via left arm AV graft.  I was called today for bleeding after dialysis this was treated with primary suture repair and we scheduled for urgent fistulogram.  Findings: After initial cannulation and fistulogram I was unable to easily pass a wire past the pseudoaneurysmal degenerated area of the graft.  I was also able to get a wire centrally into the IVC and there is a tight stenosis at the graft vein anastomosis measuring approximately 90% and this was primarily stented postdilated with 7 mm balloon with 0% residual stenosis and then I covered the pseudoaneurysm where there was also at least 50% stenosis with two 7 mm covered stents and at completion there was brisk flow through the graft without residual stenosis at the level of the pseudoaneurysm or the vein anastomosis.  I did not identify any stenosis on the arterial limb or the arterial anastomosis.   Procedure:  The patient was identified in the holding area and taken to room 8.  The patient was then placed supine on the table and prepped and draped in the usual sterile fashion.  A time out was called.  Ultrasound was used to evaluate the left extremity AV graft and this was cannulated with direct ultrasound visualization with micropuncture needle followed by wire sheath.  An image was saved the permanent record.  Given the stenosis likely in the pseudoaneurysm I cannot  pass a wire more than a few centimeters and then I placed a micropuncture sheath there and perform fistulogram.  I then used a Bentson wire was able to steer past the pseudoaneurysm and then placed a 7 French sheath.  I then used a Kumpe catheter and I was able to exchanged for an 018 wire and then primarily stented the venous anastomosis with a 7 x 50 mm Viabahn and postdilated with 7 mm balloon to 0% residual stenosis.  While the balloon was inflated I performed retrograde fistulogram which demonstrated patency of the arterial limb of the loop graft with a patent arterial anastomosis.  I then elected to primarily stent the pseudoaneurysmal with associated stenosed area and degenerated area and I placed 1 stent which did not cover the entire to the area and so I placed a second 1 proximally all the way back to the origin of my sheath and these were postdilated with 7 mm balloon at completion there was no further pulsatility or residual stenosis within the pseudoaneurysm there was brisk flow through the graft.  Satisfied with this I suture-ligated the cannulation zone with 4 Monocryl and then removed the previous suture ligation which was a 0 silk and placed a 4-0 Monocryl there as well.  Patient did tolerate the procedure well without any complication.     Gabrielle Wakeland C. Randie Heinz, MD [facility] Office: [phone] Pager: [phone]

## 2022-01-05 NOTE — H&P (Signed)
H+P    History of Present Illness: This is a 66 y.o. female history of end-stage renal disease on dialysis via left arm AV graft which was recently revised approximately 2 months ago.  She was then admitted last month with code stroke and received tnk and initially had a bleeding event from her graft.  Today after completion of dialysis she had bleeding that required prolonged compression and ultimately consulted vascular surgery.  Patient is unable to discuss any events leading up given just underlying mental status.  All information obtained from the chart and from the bedside care providers whom are very helpful.  Past Medical History:  Diagnosis Date   Alcohol-induced pancreatitis    CAD (coronary artery disease) 09/17/2020   Chronic diarrhea    Chronic kidney disease    Depression    Diabetes mellitus    fasting blood sugar 110-120s   Dialysis patient ([place])    Diastolic CHF ([place])    DKA (diabetic ketoacidoses)    Gastroparesis    GERD (gastroesophageal reflux disease)    Heart murmur    History of kidney stones    Hyperlipidemia    Hypertension    Hypokalemia    Muscle spasm    Neuropathic pain    Neuropathy    Hx: of   Pyelonephritis    Seizures ([place])    Vitamin B12 deficiency    Vitamin D deficiency     Past Surgical History:  Procedure Laterality Date   AV FISTULA PLACEMENT Left 12/15/2017   Procedure: INSERTION OF ARTERIOVENOUS (AV) GORE-TEX GRAFT ARM;  Surgeon: Rosetta Posner, MD;  Location: [facility];  Service: Vascular;  Laterality: Left;   CATARACT EXTRACTION W/PHACO Right 03/14/2015   Procedure: CATARACT EXTRACTION PHACO AND INTRAOCULAR LENS PLACEMENT ([place]);  Surgeon: Baruch Goldmann, MD;  Location: [facility];  Service: Ophthalmology;  Laterality: Right;  CDE:11.13   CATARACT EXTRACTION W/PHACO Left 04/11/2015   Procedure: CATARACT EXTRACTION PHACO AND INTRAOCULAR LENS PLACEMENT LEFT EYE CDE=9.68;  Surgeon: Baruch Goldmann, MD;  Location: [facility];  Service: Ophthalmology;   Laterality: Left;   COLONOSCOPY  02/24/2010   fair prep, limited exam. Two small polyps removed. Tubular adenoma   cystoscopy with ureteral stent  Bilateral 10/07/2017   At [facility] CV LINE RIGHT  11/17/2017   IR FLUORO GUIDE CV LINE RIGHT  11/22/2017   IR REMOVAL TUN CV CATH W/O FL  01/26/2018   IR US GUIDE VASC ACCESS RIGHT  11/17/2017   MULTIPLE EXTRACTIONS WITH ALVEOLOPLASTY N/A 06/13/2012   Procedure: MULTIPLE EXTRACION WITH ALVEOLOPLASTY EXTRACT: 18, 19, 20, 21, 22, 24, 25, 27, 28, 29, 30, 31;  Surgeon: Gae Bon, DDS;  Location: [facility];  Service: Oral Surgery;  Laterality: N/A;   ORIF HUMERUS FRACTURE Right 04/20/2019   Procedure: OPEN REDUCTION INTERNAL FIXATION (ORIF) PROXIMAL HUMERUS FRACTURE;  Surgeon: Marchia Bond, MD;  Location: [facility];  Service: Orthopedics;  Laterality: Right;   REVISION OF ARTERIOVENOUS GORETEX GRAFT Left 11/04/2021   Procedure: REVISION OF LEFT UPPER ARM ARTERIOVENOUS GORETEX GRAFT WITH REPLACEMENT OF LATERAL ARTERIAL HALF OF GRAFT;  Surgeon: Rosetta Posner, MD;  Location: [facility];  Service: Vascular;  Laterality: Left;   TUBAL LIGATION     URETERAL STENT PLACEMENT  09/2017    Allergies  Allergen Reactions   Aspirin Palpitations and Other (See Comments)    Listed on [facility] 11/12/18   Metformin And Related     Prior to Admission medications  Medication Sig Start Date End Date Taking? Authorizing Provider  acetaminophen (TYLENOL) 325 MG tablet Take 2 tablets (650 mg total) by mouth every 6 (six) hours as needed for mild pain (or Fever >/= 101). 10/19/21   Allie Bossier, MD  Biotin 10 MG TABS Take 10 mg by mouth daily at 12 noon.    [provider]  Darbepoetin Alfa (ARANESP) 60 MCG/0.3ML SOSY injection Inject 0.3 mLs (60 mcg total) into the skin every Saturday at 6 PM. 01/03/22   Little Ishikawa, MD  famotidine (PEPCID) 10 MG tablet Place 1 tablet (10 mg total) into feeding tube daily. 01/01/22   Little Ishikawa, MD  folic acid (FOLVITE) 1 MG tablet Place 1 tablet (1 mg total) into feeding tube daily at 12 noon. 12/31/21   Little Ishikawa, MD  insulin aspart (NOVOLOG) 100 UNIT/ML injection Inject 0-6 Units into the skin every 4 (four) hours. 12/31/21   Little Ishikawa, MD  lacosamide (VIMPAT) 10 MG/ML oral solution Take 15 mLs (150 mg total) by mouth 2 (two) times daily. 12/31/21   Little Ishikawa, MD  lacosamide (VIMPAT) 10 MG/ML oral solution Take 5 mLs (50 mg total) by mouth 3 (three) times a week. After HD session 12/31/21   Little Ishikawa, MD  levETIRAcetam (KEPPRA) 100 MG/ML solution Place 5 mLs (500 mg total) into feeding tube 2 (two) times daily. 12/31/21   Little Ishikawa, MD  levETIRAcetam (KEPPRA) 100 MG/ML solution Place 2.5 mLs (250 mg total) into feeding tube as needed (give after each HD). 12/31/21   Little Ishikawa, MD  mirtazapine (REMERON) 30 MG tablet Take 30 mg by mouth at bedtime.    [provider]  Multiple Vitamin (MULTIVITAMIN ADULT PO) Take 1 tablet by mouth daily at 12 noon.    [provider]  multivitamin (RENA-VIT) TABS tablet Place 1 tablet into feeding tube at bedtime. 12/31/21   Little Ishikawa, MD  nutrition supplement, JUVEN, Fanny Dance) PACK Place 1 packet into feeding tube 2 (two) times daily between meals. 12/31/21   Little Ishikawa, MD  Nutritional Supplements (FEEDING SUPPLEMENT, PEPTAMEN 1.5 CAL,) LIQD Place 1,000 mLs into feeding tube continuous. 12/31/21   Little Ishikawa, MD  pantoprazole (PROTONIX) 40 MG tablet Take 1 tablet (40 mg total) by mouth daily. Patient taking differently: Take 40 mg by mouth daily at 12 noon. 11/19/19 12/15/21  Oswald Hillock, MD  phenytoin (DILANTIN) 125 MG/5ML suspension Place 4 mLs (100 mg total) into feeding tube every 8 (eight) hours. 12/31/21   Little Ishikawa, MD  Protein (FEEDING SUPPLEMENT, PROSOURCE TF20,) liquid Place 60 mLs into feeding tube daily.  01/01/22   Little Ishikawa, MD  vitamin C (ASCORBIC ACID) 500 MG tablet Take 500 mg by mouth 2 (two) times daily.    [provider]    Social History   Socioeconomic History   Marital status: Legally Separated    Spouse name: Not on file   Number of children: Not on file   Years of education: Not on file   Highest education level: Not on file  Occupational History   Not on file  Tobacco Use   Smoking status: Never   Smokeless tobacco: Current    Types: Snuff  Vaping Use   Vaping Use: Never used  Substance and Sexual Activity   Alcohol use: Not Currently    Alcohol/week: 1.0 standard drink of alcohol    Types: 1 Cans of beer  per week   Drug use: No   Sexual activity: Not Currently  Other Topics Concern   Not on file  Social History Narrative   Not on file   Social Determinants of Health   Financial Resource Strain: Not on file  Food Insecurity: Not on file  Transportation Needs: Not on file  Physical Activity: Not on file  Stress: Not on file  Social Connections: Not on file  Intimate Partner Violence: Not on file     Family History  Problem Relation Age of Onset   Diabetes Sister    Chronic Renal Failure Neg Hx    Colon cancer Neg Hx    Colon polyps Neg Hx     ROS Unable to obtain secondary to patient confusion   Physical Examination No vitals on record  Physical Exam Patient is awake and alert Left arm AV graft had pulsatile bleeding and a 0 silk suture was placed with manual compression of the graft Recommendation there is pulsatility throughout the upper arm AV graft  CBC    Component Value Date/Time   WBC 13.7 (H) 01/05/2022 0144   RBC 2.53 (L) 01/05/2022 0144   HGB 7.8 (L) 01/05/2022 0144   HCT 24.5 (L) 01/05/2022 0144   PLT 392 01/05/2022 0144   MCV 96.8 01/05/2022 0144   MCH 30.8 01/05/2022 0144   MCHC 31.8 01/05/2022 0144   RDW 21.0 (H) 01/05/2022 0144   LYMPHSABS 1.4 12/31/2021 0458   MONOABS 1.8 (H) 12/31/2021 0458    EOSABS 0.4 12/31/2021 0458   BASOSABS 0.0 12/31/2021 0458    BMET    Component Value Date/Time   NA 137 01/05/2022 0144   K 4.1 01/05/2022 0144   CL 95 (L) 01/05/2022 0144   CO2 21 (L) 01/05/2022 0144   GLUCOSE 91 01/05/2022 0144   BUN 56 (H) 01/05/2022 0144   CREATININE 3.55 (H) 01/05/2022 0144   CALCIUM 9.5 01/05/2022 0144   CALCIUM 9.1 01/02/2022 1142   GFRNONAA 14 (L) 01/05/2022 0144   GFRAA 15 (L) 11/18/2019 0742    COAGS: Lab Results  Component Value Date   INR 3.9 (H) 01/03/2022   INR 4.1 (HH) 01/02/2022   INR 2.6 (H) 12/31/2021     Non-Invasive Vascular Imaging:   No studies  ASSESSMENT/PLAN: This is a 66 y.o. female with pulsatility and prolonged bleeding from left arm AV graft.  A suture was placed for hemostasis and we will plan for fistulogram of the left upper extremity today in the Cath Lab.  I discussed the case with the patient's son via telephone and he demonstrates good understanding and agrees to sign consent.   Jerry Haugen C. Donzetta Matters, MD [facility] Office: [phone] Pager: [phone]

## 2022-01-05 NOTE — H&P (Incomplete)
H+P    History of Present Illness: This is a 66 y.o. female ***  Past Medical History:  Diagnosis Date  . Alcohol-induced pancreatitis   . CAD (coronary artery disease) 09/17/2020  . Chronic diarrhea   . Chronic kidney disease   . Depression   . Diabetes mellitus    fasting blood sugar 110-120s  . Dialysis patient ([place])   . Diastolic CHF ([place])   . DKA (diabetic ketoacidoses)   . Gastroparesis   . GERD (gastroesophageal reflux disease)   . Heart murmur   . History of kidney stones   . Hyperlipidemia   . Hypertension   . Hypokalemia   . Muscle spasm   . Neuropathic pain   . Neuropathy    Hx: of  . Pyelonephritis   . Seizures ([place])   . Vitamin B12 deficiency   . Vitamin D deficiency     Past Surgical History:  Procedure Laterality Date  . AV FISTULA PLACEMENT Left 12/15/2017   Procedure: INSERTION OF ARTERIOVENOUS (AV) GORE-TEX GRAFT ARM;  Surgeon: Rosetta Posner, MD;  Location: [facility];  Service: Vascular;  Laterality: Left;  . CATARACT EXTRACTION W/PHACO Right 03/14/2015   Procedure: CATARACT EXTRACTION PHACO AND INTRAOCULAR LENS PLACEMENT (IOC);  Surgeon: Baruch Goldmann, MD;  Location: [facility];  Service: Ophthalmology;  Laterality: Right;  CDE:11.13  . CATARACT EXTRACTION W/PHACO Left 04/11/2015   Procedure: CATARACT EXTRACTION PHACO AND INTRAOCULAR LENS PLACEMENT LEFT EYE CDE=9.68;  Surgeon: Baruch Goldmann, MD;  Location: [facility];  Service: Ophthalmology;  Laterality: Left;  . COLONOSCOPY  02/24/2010   fair prep, limited exam. Two small polyps removed. Tubular adenoma  . cystoscopy with ureteral stent  Bilateral 10/07/2017   At [facility] CV LINE RIGHT  11/17/2017  . IR FLUORO GUIDE CV LINE RIGHT  11/22/2017  . IR REMOVAL TUN CV CATH W/O FL  01/26/2018  . IR US GUIDE VASC ACCESS RIGHT  11/17/2017  . MULTIPLE EXTRACTIONS WITH ALVEOLOPLASTY N/A 06/13/2012   Procedure: MULTIPLE EXTRACION WITH ALVEOLOPLASTY EXTRACT: 18, 19, 20, 21, 22, 24, 25, 27,  28, 29, 30, 31;  Surgeon: Gae Bon, DDS;  Location: [facility];  Service: Oral Surgery;  Laterality: N/A;  . ORIF HUMERUS FRACTURE Right 04/20/2019   Procedure: OPEN REDUCTION INTERNAL FIXATION (ORIF) PROXIMAL HUMERUS FRACTURE;  Surgeon: Marchia Bond, MD;  Location: [facility];  Service: Orthopedics;  Laterality: Right;  . REVISION OF ARTERIOVENOUS GORETEX GRAFT Left 11/04/2021   Procedure: REVISION OF LEFT UPPER ARM ARTERIOVENOUS GORETEX GRAFT WITH REPLACEMENT OF LATERAL ARTERIAL HALF OF GRAFT;  Surgeon: Rosetta Posner, MD;  Location: [facility];  Service: Vascular;  Laterality: Left;  . TUBAL LIGATION    . URETERAL STENT PLACEMENT  09/2017    Allergies  Allergen Reactions  . Aspirin Palpitations and Other (See Comments)    Listed on [facility] 11/12/18  . Metformin And Related     Prior to Admission medications   Medication Sig Start Date End Date Taking? Authorizing Provider  acetaminophen (TYLENOL) 325 MG tablet Take 2 tablets (650 mg total) by mouth every 6 (six) hours as needed for mild pain (or Fever >/= 101). 10/19/21   Allie Bossier, MD  Biotin 10 MG TABS Take 10 mg by mouth daily at 12 noon.    [provider]  Darbepoetin Alfa (ARANESP) 60 MCG/0.3ML SOSY injection Inject 0.3 mLs (60 mcg total) into the skin every Saturday at 6 PM. 01/03/22   Avon Gully,  Luanna Cole, MD  famotidine (PEPCID) 10 MG tablet Place 1 tablet (10 mg total) into feeding tube daily. 01/01/22   Little Ishikawa, MD  folic acid (FOLVITE) 1 MG tablet Place 1 tablet (1 mg total) into feeding tube daily at 12 noon. 12/31/21   Little Ishikawa, MD  insulin aspart (NOVOLOG) 100 UNIT/ML injection Inject 0-6 Units into the skin every 4 (four) hours. 12/31/21   Little Ishikawa, MD  lacosamide (VIMPAT) 10 MG/ML oral solution Take 15 mLs (150 mg total) by mouth 2 (two) times daily. 12/31/21   Little Ishikawa, MD  lacosamide (VIMPAT) 10 MG/ML oral solution Take 5 mLs (50 mg total) by mouth 3 (three) times a week.  After HD session 12/31/21   Little Ishikawa, MD  levETIRAcetam (KEPPRA) 100 MG/ML solution Place 5 mLs (500 mg total) into feeding tube 2 (two) times daily. 12/31/21   Little Ishikawa, MD  levETIRAcetam (KEPPRA) 100 MG/ML solution Place 2.5 mLs (250 mg total) into feeding tube as needed (give after each HD). 12/31/21   Little Ishikawa, MD  mirtazapine (REMERON) 30 MG tablet Take 30 mg by mouth at bedtime.    [provider]  Multiple Vitamin (MULTIVITAMIN ADULT PO) Take 1 tablet by mouth daily at 12 noon.    [provider]  multivitamin (RENA-VIT) TABS tablet Place 1 tablet into feeding tube at bedtime. 12/31/21   Little Ishikawa, MD  nutrition supplement, JUVEN, Fanny Dance) PACK Place 1 packet into feeding tube 2 (two) times daily between meals. 12/31/21   Little Ishikawa, MD  Nutritional Supplements (FEEDING SUPPLEMENT, PEPTAMEN 1.5 CAL,) LIQD Place 1,000 mLs into feeding tube continuous. 12/31/21   Little Ishikawa, MD  pantoprazole (PROTONIX) 40 MG tablet Take 1 tablet (40 mg total) by mouth daily. Patient taking differently: Take 40 mg by mouth daily at 12 noon. 11/19/19 12/15/21  Oswald Hillock, MD  phenytoin (DILANTIN) 125 MG/5ML suspension Place 4 mLs (100 mg total) into feeding tube every 8 (eight) hours. 12/31/21   Little Ishikawa, MD  Protein (FEEDING SUPPLEMENT, PROSOURCE TF20,) liquid Place 60 mLs into feeding tube daily. 01/01/22   Little Ishikawa, MD  vitamin C (ASCORBIC ACID) 500 MG tablet Take 500 mg by mouth 2 (two) times daily.    [provider]    Social History   Socioeconomic History  . Marital status: Legally Separated    Spouse name: Not on file  . Number of children: Not on file  . Years of education: Not on file  . Highest education level: Not on file  Occupational History  . Not on file  Tobacco Use  . Smoking status: Never  . Smokeless tobacco: Current    Types: Snuff  Vaping Use  . Vaping Use:  Never used  Substance and Sexual Activity  . Alcohol use: Not Currently    Alcohol/week: 1.0 standard drink of alcohol    Types: 1 Cans of beer per week  . Drug use: No  . Sexual activity: Not Currently  Other Topics Concern  . Not on file  Social History Narrative  . Not on file   Social Determinants of Health   Financial Resource Strain: Not on file  Food Insecurity: Not on file  Transportation Needs: Not on file  Physical Activity: Not on file  Stress: Not on file  Social Connections: Not on file  Intimate Partner Violence: Not on file     Family History  Problem  Relation Age of Onset  . Diabetes Sister   . Chronic Renal Failure Neg Hx   . Colon cancer Neg Hx   . Colon polyps Neg Hx     ROS    Physical Examination No vitals on record  Physical Exam   CBC    Component Value Date/Time   WBC 13.7 (H) 01/05/2022 0144   RBC 2.53 (L) 01/05/2022 0144   HGB 7.8 (L) 01/05/2022 0144   HCT 24.5 (L) 01/05/2022 0144   PLT 392 01/05/2022 0144   MCV 96.8 01/05/2022 0144   MCH 30.8 01/05/2022 0144   MCHC 31.8 01/05/2022 0144   RDW 21.0 (H) 01/05/2022 0144   LYMPHSABS 1.4 12/31/2021 0458   MONOABS 1.8 (H) 12/31/2021 0458   EOSABS 0.4 12/31/2021 0458   BASOSABS 0.0 12/31/2021 0458    BMET    Component Value Date/Time   NA 137 01/05/2022 0144   K 4.1 01/05/2022 0144   CL 95 (L) 01/05/2022 0144   CO2 21 (L) 01/05/2022 0144   GLUCOSE 91 01/05/2022 0144   BUN 56 (H) 01/05/2022 0144   CREATININE 3.55 (H) 01/05/2022 0144   CALCIUM 9.5 01/05/2022 0144   CALCIUM 9.1 01/02/2022 1142   GFRNONAA 14 (L) 01/05/2022 0144   GFRAA 15 (L) 11/18/2019 0742    COAGS: Lab Results  Component Value Date   INR 3.9 (H) 01/03/2022   INR 4.1 (HH) 01/02/2022   INR 2.6 (H) 12/31/2021     Non-Invasive Vascular Imaging:   No studies  ASSESSMENT/PLAN: This is a 66 y.o. female ***   -***  Salisa Broz C. Donzetta Matters, MD [facility] Office:  [phone] Pager: [phone]

## 2022-01-06 ENCOUNTER — Encounter (HOSPITAL_COMMUNITY): Payer: Self-pay | Admitting: Vascular Surgery

## 2022-01-06 LAB — CBC
HCT: 22.7 % — ABNORMAL LOW (ref 36.0–46.0)
Hemoglobin: 6.6 g/dL — CL (ref 12.0–15.0)
MCH: 29.7 pg (ref 26.0–34.0)
MCHC: 29.1 g/dL — ABNORMAL LOW (ref 30.0–36.0)
MCV: 102.3 fL — ABNORMAL HIGH (ref 80.0–100.0)
Platelets: 429 10*3/uL — ABNORMAL HIGH (ref 150–400)
RBC: 2.22 MIL/uL — ABNORMAL LOW (ref 3.87–5.11)
RDW: 21.2 % — ABNORMAL HIGH (ref 11.5–15.5)
WBC: 13.1 10*3/uL — ABNORMAL HIGH (ref 4.0–10.5)
nRBC: 0.2 % (ref 0.0–0.2)

## 2022-01-06 LAB — PREPARE RBC (CROSSMATCH)

## 2022-01-06 LAB — HEMOGLOBIN AND HEMATOCRIT, BLOOD
HCT: 23.7 % — ABNORMAL LOW (ref 36.0–46.0)
Hemoglobin: 7.4 g/dL — ABNORMAL LOW (ref 12.0–15.0)

## 2022-01-06 LAB — PROTIME-INR
INR: 2.1 — ABNORMAL HIGH (ref 0.8–1.2)
Prothrombin Time: 23 seconds — ABNORMAL HIGH (ref 11.4–15.2)

## 2022-01-06 MED FILL — Lidocaine HCl Local Preservative Free (PF) Inj 1%: INTRAMUSCULAR | Qty: 30 | Status: AC

## 2022-01-06 NOTE — Progress Notes (Signed)
  Progress Note    01/06/2022 4:49 PM * No surgery found *  Subjective: No overnight issues  There were no vitals filed for this visit.  Physical Exam: Awake and alert Dressing taken down there is strong thrill throughout the graft and there is a dressing on the medial aspect that was left in place where there was bleeding yesterday  CBC    Component Value Date/Time   WBC 13.1 (H) 01/06/2022 0631   RBC 2.22 (L) 01/06/2022 0631   HGB 6.6 (LL) 01/06/2022 0631   HCT 22.7 (L) 01/06/2022 0631   PLT 429 (H) 01/06/2022 0631   MCV 102.3 (H) 01/06/2022 0631   MCH 29.7 01/06/2022 0631   MCHC 29.1 (L) 01/06/2022 0631   RDW 21.2 (H) 01/06/2022 0631   LYMPHSABS 1.4 12/31/2021 0458   MONOABS 1.8 (H) 12/31/2021 0458   EOSABS 0.4 12/31/2021 0458   BASOSABS 0.0 12/31/2021 0458    BMET    Component Value Date/Time   NA 137 01/05/2022 0144   K 4.1 01/05/2022 0144   CL 95 (L) 01/05/2022 0144   CO2 21 (L) 01/05/2022 0144   GLUCOSE 91 01/05/2022 0144   BUN 56 (H) 01/05/2022 0144   CREATININE 3.55 (H) 01/05/2022 0144   CALCIUM 9.5 01/05/2022 0144   CALCIUM 9.1 01/02/2022 1142   GFRNONAA 14 (L) 01/05/2022 0144   GFRAA 15 (L) 11/18/2019 0742    INR    Component Value Date/Time   INR 2.1 (H) 01/06/2022 0631    No intake or output data in the 24 hours ending 01/06/22 1649   Assessment/plan:  66 y.o. female is s/p stenting of AV graft for venous anastomotic stenosis as well as bleeding pseudoaneurysm.  I have marked the graft to be used on the lateral aspect of the arm hopefully can avoid the medial aspect where the stents were placed.  Vascular surgery will be available as needed.    Coleson Kant C. Donzetta Matters, MD [facility] Office: [phone] Pager: [phone]  01/06/2022 4:49 PM

## 2022-01-07 ENCOUNTER — Other Ambulatory Visit (HOSPITAL_COMMUNITY): Payer: Medicare Other

## 2022-01-07 LAB — CBC
HCT: 26.5 % — ABNORMAL LOW (ref 36.0–46.0)
HCT: 24.5 % — ABNORMAL LOW (ref 36.0–46.0)
HCT: 23.9 % — ABNORMAL LOW (ref 36.0–46.0)
HCT: 21.2 % — ABNORMAL LOW (ref 36.0–46.0)
Hemoglobin: 8.5 g/dL — ABNORMAL LOW (ref 12.0–15.0)
Hemoglobin: 7.7 g/dL — ABNORMAL LOW (ref 12.0–15.0)
Hemoglobin: 7.1 g/dL — ABNORMAL LOW (ref 12.0–15.0)
Hemoglobin: 7 g/dL — ABNORMAL LOW (ref 12.0–15.0)
MCH: 30.5 pg (ref 26.0–34.0)
MCH: 30.3 pg (ref 26.0–34.0)
MCH: 30.1 pg (ref 26.0–34.0)
MCH: 31.5 pg (ref 26.0–34.0)
MCHC: 32.1 g/dL (ref 30.0–36.0)
MCHC: 31.4 g/dL (ref 30.0–36.0)
MCHC: 29.7 g/dL — ABNORMAL LOW (ref 30.0–36.0)
MCHC: 33 g/dL (ref 30.0–36.0)
MCV: 95 fL (ref 80.0–100.0)
MCV: 96.5 fL (ref 80.0–100.0)
MCV: 101.3 fL — ABNORMAL HIGH (ref 80.0–100.0)
MCV: 95.5 fL (ref 80.0–100.0)
Platelets: 436 10*3/uL — ABNORMAL HIGH (ref 150–400)
Platelets: 426 10*3/uL — ABNORMAL HIGH (ref 150–400)
Platelets: 428 10*3/uL — ABNORMAL HIGH (ref 150–400)
Platelets: 430 10*3/uL — ABNORMAL HIGH (ref 150–400)
RBC: 2.79 MIL/uL — ABNORMAL LOW (ref 3.87–5.11)
RBC: 2.54 MIL/uL — ABNORMAL LOW (ref 3.87–5.11)
RBC: 2.36 MIL/uL — ABNORMAL LOW (ref 3.87–5.11)
RBC: 2.22 MIL/uL — ABNORMAL LOW (ref 3.87–5.11)
RDW: 22.9 % — ABNORMAL HIGH (ref 11.5–15.5)
RDW: 22.9 % — ABNORMAL HIGH (ref 11.5–15.5)
RDW: 23.3 % — ABNORMAL HIGH (ref 11.5–15.5)
RDW: 22.5 % — ABNORMAL HIGH (ref 11.5–15.5)
WBC: 14.8 10*3/uL — ABNORMAL HIGH (ref 4.0–10.5)
WBC: 14.1 10*3/uL — ABNORMAL HIGH (ref 4.0–10.5)
WBC: 15.4 10*3/uL — ABNORMAL HIGH (ref 4.0–10.5)
WBC: 14.9 10*3/uL — ABNORMAL HIGH (ref 4.0–10.5)
nRBC: 0.1 % (ref 0.0–0.2)
nRBC: 0.6 % — ABNORMAL HIGH (ref 0.0–0.2)
nRBC: 1.2 % — ABNORMAL HIGH (ref 0.0–0.2)
nRBC: 1.6 % — ABNORMAL HIGH (ref 0.0–0.2)

## 2022-01-07 LAB — BASIC METABOLIC PANEL
Anion gap: 20 — ABNORMAL HIGH (ref 5–15)
Anion gap: 23 — ABNORMAL HIGH (ref 5–15)
BUN: 50 mg/dL — ABNORMAL HIGH (ref 8–23)
BUN: 56 mg/dL — ABNORMAL HIGH (ref 8–23)
CO2: 19 mmol/L — ABNORMAL LOW (ref 22–32)
CO2: 17 mmol/L — ABNORMAL LOW (ref 22–32)
Calcium: 9.4 mg/dL (ref 8.9–10.3)
Calcium: 9.3 mg/dL (ref 8.9–10.3)
Chloride: 98 mmol/L (ref 98–111)
Chloride: 97 mmol/L — ABNORMAL LOW (ref 98–111)
Creatinine, Ser: 3.75 mg/dL — ABNORMAL HIGH (ref 0.44–1.00)
Creatinine, Ser: 4.23 mg/dL — ABNORMAL HIGH (ref 0.44–1.00)
GFR, Estimated: 13 mL/min — ABNORMAL LOW (ref >60)
GFR, Estimated: 11 mL/min — ABNORMAL LOW (ref >60)
Glucose, Bld: 204 mg/dL — ABNORMAL HIGH (ref 70–99)
Glucose, Bld: 207 mg/dL — ABNORMAL HIGH (ref 70–99)
Potassium: 3.9 mmol/L (ref 3.5–5.1)
Potassium: 4.1 mmol/L (ref 3.5–5.1)
Sodium: 137 mmol/L (ref 135–145)
Sodium: 137 mmol/L (ref 135–145)

## 2022-01-07 LAB — COMPREHENSIVE METABOLIC PANEL
ALT: 57 U/L — ABNORMAL HIGH (ref 0–44)
AST: 97 U/L — ABNORMAL HIGH (ref 15–41)
Albumin: 1.8 g/dL — ABNORMAL LOW (ref 3.5–5.0)
Alkaline Phosphatase: 442 U/L — ABNORMAL HIGH (ref 38–126)
Anion gap: 19 — ABNORMAL HIGH (ref 5–15)
BUN: 54 mg/dL — ABNORMAL HIGH (ref 8–23)
CO2: 17 mmol/L — ABNORMAL LOW (ref 22–32)
Calcium: 8.9 mg/dL (ref 8.9–10.3)
Chloride: 99 mmol/L (ref 98–111)
Creatinine, Ser: 4.23 mg/dL — ABNORMAL HIGH (ref 0.44–1.00)
GFR, Estimated: 11 mL/min — ABNORMAL LOW (ref >60)
Glucose, Bld: 267 mg/dL — ABNORMAL HIGH (ref 70–99)
Potassium: 4.4 mmol/L (ref 3.5–5.1)
Sodium: 135 mmol/L (ref 135–145)
Total Bilirubin: 9.7 mg/dL — ABNORMAL HIGH (ref 0.3–1.2)
Total Protein: 7.1 g/dL (ref 6.5–8.1)

## 2022-01-07 LAB — PROTIME-INR
INR: 1.5 — ABNORMAL HIGH (ref 0.8–1.2)
Prothrombin Time: 18.2 seconds — ABNORMAL HIGH (ref 11.4–15.2)

## 2022-01-07 LAB — BLOOD GAS, ARTERIAL
Acid-base deficit: 5.4 mmol/L — ABNORMAL HIGH (ref 0.0–2.0)
Bicarbonate: 17 mmol/L — ABNORMAL LOW (ref 20.0–28.0)
O2 Saturation: 100 %
Patient temperature: 37
pCO2 arterial: 25 mmHg — ABNORMAL LOW (ref 32–48)
pH, Arterial: 7.44 (ref 7.35–7.45)
pO2, Arterial: 300 mmHg — ABNORMAL HIGH (ref 83–108)

## 2022-01-07 LAB — APTT: aPTT: 40 seconds — ABNORMAL HIGH (ref 24–36)

## 2022-01-07 LAB — MAGNESIUM: Magnesium: 2.4 mg/dL (ref 1.7–2.4)

## 2022-01-07 LAB — TROPONIN I (HIGH SENSITIVITY): Troponin I (High Sensitivity): 47 ng/L — ABNORMAL HIGH (ref <18)

## 2022-01-07 NOTE — Progress Notes (Signed)
[facility]  ROUNDING NOTE   Subjective:  Patient had significant bleeding from her left upper extremity AV access. Appreciate vascular surgery assistance. Stent was placed.  Objective:  Vital signs in last 24 hours:  Temperature 1.5 pulse 91 respirations 24 blood pressure 122/69  Physical Exam: General: Chronically ill-appearing  Head: Normocephalic, atraumatic. Moist oral mucosal membranes  Neck: Supple  Lungs:  Clear to auscultation, normal effort  Heart: S1S2 no rubs  Abdomen:  Soft, nontender, bowel sounds present  Extremities: No peripheral edema.  Neurologic: Awake, alert, following commands  Skin: No acute rash  Access: Left upper extremity AV graft    Basic Metabolic Panel: Recent Labs  Lab 01/03/22 0220 01/05/22 0144 01/07/22 0109  NA 137 137 137  K 3.2* 4.1 3.9  CL 96* 95* 98  CO2 27 21* 19*  GLUCOSE 123* 91 204*  BUN 26* 56* 50*  CREATININE 1.76* 3.55* 3.75*  CALCIUM 9.1 9.5 9.4  MG 2.0 2.3  --   PHOS 3.1 4.9*  --      Liver Function Tests: No results for input(s): "AST", "ALT", "ALKPHOS", "BILITOT", "PROT", "ALBUMIN" in the last 168 hours.  No results for input(s): "LIPASE", "AMYLASE" in the last 168 hours. No results for input(s): "AMMONIA" in the last 168 hours.  CBC: Recent Labs  Lab 01/03/22 0220 01/03/22 1048 01/05/22 0144 01/06/22 0631 01/06/22 1942 01/07/22 0109  WBC 12.5* 11.7* 13.7* 13.1*  --  14.8*  HGB 6.7* 8.2* 7.8* 6.6* 7.4* 8.5*  HCT 22.2* 26.1* 24.5* 22.7* 23.7* 26.5*  MCV 101.8* 98.5 96.8 102.3*  --  95.0  PLT 378 377 392 429*  --  436*     Cardiac Enzymes: No results for input(s): "CKTOTAL", "CKMB", "CKMBINDEX", "TROPONINI" in the last 168 hours.  BNP: Invalid input(s): "POCBNP"  CBG: Recent Labs  Lab 12/31/21 1238  Thomas*     Microbiology: Results for orders placed or performed during the hospital encounter of 12/31/21  Culture, blood (Routine X 2) w Reflex to ID Panel     Status: None  (Preliminary result)   Collection Time: 01/03/22  2:22 AM   Specimen: BLOOD  Result Value Ref Range Status   Specimen Description BLOOD BLOOD RIGHT HAND  Final   Special Requests   Final    BOTTLES DRAWN AEROBIC AND ANAEROBIC Blood Culture adequate volume   Culture   Final    NO GROWTH 4 DAYS Performed at [facility], [place] [address] [place], [place] [address]    Report Status PENDING  Incomplete  Culture, blood (Routine X 2) w Reflex to ID Panel     Status: None (Preliminary result)   Collection Time: 01/03/22  2:24 AM   Specimen: BLOOD  Result Value Ref Range Status   Specimen Description BLOOD BLOOD RIGHT HAND  Final   Special Requests   Final    BOTTLES DRAWN AEROBIC ONLY Blood Culture adequate volume   Culture   Final    NO GROWTH 4 DAYS Performed at [facility], [place] [address], [place], [place] [address]    Report Status PENDING  Incomplete  Culture, blood (Routine X 2) w Reflex to ID Panel     Status: None (Preliminary result)   Collection Time: 01/04/22  4:49 PM   Specimen: BLOOD RIGHT HAND  Result Value Ref Range Status   Specimen Description BLOOD RIGHT HAND  Final   Special Requests IN PEDIATRIC BOTTLE Blood Culture adequate volume  Final  Culture   Final    NO GROWTH 3 DAYS Performed at [facility], [place] [address] [place], [place] [address]    Report Status PENDING  Incomplete    Coagulation Studies: Recent Labs    01/06/22 0631 01/07/22 0109  LABPROT 23.0* 18.2*  INR 2.1* 1.5*     Urinalysis: No results for input(s): "COLORURINE", "LABSPEC", "PHURINE", "GLUCOSEU", "HGBUR", "BILIRUBINUR", "KETONESUR", "PROTEINUR", "UROBILINOGEN", "NITRITE", "LEUKOCYTESUR" in the last 72 hours.  Invalid input(s): "APPERANCEUR"    Imaging: PERIPHERAL VASCULAR CATHETERIZATION  Result Date: 01/05/2022 Images from the original result were not included. Patient name: Erica [name] MRN: [id number] DOB: [birth year] Sex: female 01/05/2022  Pre-operative Diagnosis: End-stage renal disease, malfunction left arm AV graft Post-operative diagnosis:  Same Surgeon:  Eda Paschal. Donzetta Matters, MD Procedure Performed: 1.  Ultrasound-guided cannulation left arm AV graft 2.  Left upper extremity fistulogram 3.  Stent of graft to venous anastomosis with 7 x 50 mm Viabahn 4.  Stent of vein graft stenosis at the site of pseudoaneurysmal degeneration with 7 x 50 mm Viabahn x2 Indications: 66 year old female with end-stage renal disease dialyzing via left arm AV graft.  I was called today for bleeding after dialysis this was treated with primary suture repair and we scheduled for urgent fistulogram. Findings: After initial cannulation and fistulogram I was unable to easily pass a wire past the pseudoaneurysmal degenerated area of the graft.  I was also able to get a wire centrally into the IVC and there is a tight stenosis at the graft vein anastomosis and this was primarily stented postdilated with 7 mm balloon and then I covered the pseudoaneurysm with two 7 mm covered stents and at completion there was brisk flow through the graft.  I did not identify any stenosis on the arterial limb or the arterial anastomosis.  Procedure:  The patient was identified in the holding area and taken to room 8.  The patient was then placed supine on the table and prepped and draped in the usual sterile fashion.  A time out was called.  Ultrasound was used to evaluate the left extremity AV graft and this was cannulated with direct ultrasound visualization with micropuncture needle followed by wire sheath.  An image was saved the permanent record.  Given the stenosis likely in the pseudoaneurysm I cannot pass a wire more than a few centimeters and then I placed a micropuncture sheath there and perform fistulogram.  I then used a Bentson wire was able to steer past the pseudoaneurysm and then placed a 7 French sheath.  I then used a Kumpe catheter and I was able to exchanged for an 018 wire and  then primarily stented the venous anastomosis with a 7 x 50 mm Viabahn and postdilated with 7 mm balloon.  While the balloon was inflated I performed retrograde fistulogram which demonstrated patency of the arterial limb of the loop graft with a patent arterial anastomosis.  I then elected to primarily stent the pseudoaneurysmal degenerated area and I placed 1 stent which did not cover the entire to the area and so I placed a second 1 proximally all the way back to the origin of my sheath and these were postdilated with 7 mm balloon at completion there was no further pulsatility within the pseudoaneurysm there was brisk flow through the graft.  Satisfied with this I suture-ligated the cannulation zone with 4 Monocryl and then removed the previous suture ligation which was a 0 silk and placed a 4-0 Monocryl there  as well.  Patient did tolerate the procedure well without any complication. Brandon C. Donzetta Matters, MD [facility] Office: [phone] Pager: [phone]     Medications:       Assessment/ Plan:  66 y.o. female with past medical history of hypertension, hyperlipidemia, CHF, seizure disorder with history of recent status epilepticus, diabetes mellitus type 2 with peripheral neuropathy, ESRD on HD TTS as an outpatient, anemia of chronic kidney disease, secondary hyperparathyroidism, generalized debility, sacral decubitus ulcer, acute hypoxemic respiratory failure, lobar pneumonia, severe protein calorie malnutrition who was admitted for ongoing rehabilitation, nutritional support, and dialysis treatment.  1.  ESRD on HD.  Patient was found to have a tight stenosis at the graft vein anastomosis measuring 90%.  Stent was placed at this location.  Patient due for hemodialysis treatment today.  Appreciate vascular surgery assistance.  2.  Anemia of chronic kidney disease.  Lab Results  Component Value Date   HGB 8.5 (L) 01/07/2022   Hemoglobin up to 8.5.  Continue  Retacrit 30,000 units subcutaneous weekly.  3.  Secondary hyperparathyroidism. PTH Lab Results  Component Value Date   PTH 121 (H) 01/02/2022   PTH Comment 01/02/2022   CALCIUM 9.4 01/07/2022   CAION 1.17 12/29/2021   PHOS 4.9 (H) 01/05/2022  Phosphorus 1.9 at last check.  Continue to monitor blood metabolism parameters.      LOS: 0 Thedford Bunton 11/22/20239:20 AM

## 2022-01-08 ENCOUNTER — Other Ambulatory Visit (HOSPITAL_COMMUNITY): Payer: Medicare Other

## 2022-01-08 ENCOUNTER — Encounter (HOSPITAL_COMMUNITY): Payer: Self-pay

## 2022-01-08 ENCOUNTER — Inpatient Hospital Stay (HOSPITAL_COMMUNITY)
Admission: EM | Admit: 2022-01-08 | Discharge: 2022-01-16 | DRG: 853 | Disposition: E | Payer: Medicare Other | Source: Other Acute Inpatient Hospital | Attending: Pulmonary Disease | Admitting: Pulmonary Disease

## 2022-01-08 ENCOUNTER — Encounter (HOSPITAL_COMMUNITY): Payer: Self-pay | Admitting: Pulmonary Disease

## 2022-01-08 DIAGNOSIS — E559 Vitamin D deficiency, unspecified: Secondary | ICD-10-CM | POA: Diagnosis present

## 2022-01-08 DIAGNOSIS — G9341 Metabolic encephalopathy: Secondary | ICD-10-CM | POA: Diagnosis present

## 2022-01-08 DIAGNOSIS — E538 Deficiency of other specified B group vitamins: Secondary | ICD-10-CM | POA: Diagnosis present

## 2022-01-08 DIAGNOSIS — Z992 Dependence on renal dialysis: Secondary | ICD-10-CM | POA: Diagnosis not present

## 2022-01-08 DIAGNOSIS — I132 Hypertensive heart and chronic kidney disease with heart failure and with stage 5 chronic kidney disease, or end stage renal disease: Secondary | ICD-10-CM | POA: Diagnosis present

## 2022-01-08 DIAGNOSIS — Z888 Allergy status to other drugs, medicaments and biological substances status: Secondary | ICD-10-CM

## 2022-01-08 DIAGNOSIS — Y95 Nosocomial condition: Secondary | ICD-10-CM | POA: Diagnosis present

## 2022-01-08 DIAGNOSIS — G40909 Epilepsy, unspecified, not intractable, without status epilepticus: Secondary | ICD-10-CM

## 2022-01-08 DIAGNOSIS — E43 Unspecified severe protein-calorie malnutrition: Secondary | ICD-10-CM | POA: Diagnosis present

## 2022-01-08 DIAGNOSIS — E11649 Type 2 diabetes mellitus with hypoglycemia without coma: Secondary | ICD-10-CM | POA: Insufficient documentation

## 2022-01-08 DIAGNOSIS — I69391 Dysphagia following cerebral infarction: Secondary | ICD-10-CM

## 2022-01-08 DIAGNOSIS — R17 Unspecified jaundice: Secondary | ICD-10-CM | POA: Diagnosis present

## 2022-01-08 DIAGNOSIS — E1143 Type 2 diabetes mellitus with diabetic autonomic (poly)neuropathy: Secondary | ICD-10-CM | POA: Diagnosis present

## 2022-01-08 DIAGNOSIS — D631 Anemia in chronic kidney disease: Secondary | ICD-10-CM | POA: Diagnosis present

## 2022-01-08 DIAGNOSIS — K219 Gastro-esophageal reflux disease without esophagitis: Secondary | ICD-10-CM | POA: Diagnosis present

## 2022-01-08 DIAGNOSIS — J189 Pneumonia, unspecified organism: Secondary | ICD-10-CM | POA: Diagnosis present

## 2022-01-08 DIAGNOSIS — J9601 Acute respiratory failure with hypoxia: Secondary | ICD-10-CM | POA: Diagnosis present

## 2022-01-08 DIAGNOSIS — R1319 Other dysphagia: Secondary | ICD-10-CM | POA: Diagnosis present

## 2022-01-08 DIAGNOSIS — I469 Cardiac arrest, cause unspecified: Secondary | ICD-10-CM

## 2022-01-08 DIAGNOSIS — N186 End stage renal disease: Secondary | ICD-10-CM | POA: Diagnosis present

## 2022-01-08 DIAGNOSIS — K922 Gastrointestinal hemorrhage, unspecified: Secondary | ICD-10-CM

## 2022-01-08 DIAGNOSIS — E872 Acidosis, unspecified: Secondary | ICD-10-CM | POA: Diagnosis present

## 2022-01-08 DIAGNOSIS — R578 Other shock: Secondary | ICD-10-CM | POA: Diagnosis present

## 2022-01-08 DIAGNOSIS — R54 Age-related physical debility: Secondary | ICD-10-CM | POA: Diagnosis present

## 2022-01-08 DIAGNOSIS — Z66 Do not resuscitate: Secondary | ICD-10-CM | POA: Diagnosis not present

## 2022-01-08 DIAGNOSIS — K3184 Gastroparesis: Secondary | ICD-10-CM | POA: Diagnosis present

## 2022-01-08 DIAGNOSIS — E162 Hypoglycemia, unspecified: Secondary | ICD-10-CM | POA: Insufficient documentation

## 2022-01-08 DIAGNOSIS — T82838A Hemorrhage of vascular prosthetic devices, implants and grafts, initial encounter: Secondary | ICD-10-CM | POA: Diagnosis present

## 2022-01-08 DIAGNOSIS — E1122 Type 2 diabetes mellitus with diabetic chronic kidney disease: Secondary | ICD-10-CM | POA: Diagnosis present

## 2022-01-08 DIAGNOSIS — R1314 Dysphagia, pharyngoesophageal phase: Secondary | ICD-10-CM | POA: Diagnosis present

## 2022-01-08 DIAGNOSIS — Z833 Family history of diabetes mellitus: Secondary | ICD-10-CM

## 2022-01-08 DIAGNOSIS — I5032 Chronic diastolic (congestive) heart failure: Secondary | ICD-10-CM | POA: Diagnosis present

## 2022-01-08 DIAGNOSIS — E1165 Type 2 diabetes mellitus with hyperglycemia: Secondary | ICD-10-CM | POA: Diagnosis present

## 2022-01-08 DIAGNOSIS — Y713 Surgical instruments, materials and cardiovascular devices (including sutures) associated with adverse incidents: Secondary | ICD-10-CM | POA: Diagnosis present

## 2022-01-08 DIAGNOSIS — A419 Sepsis, unspecified organism: Principal | ICD-10-CM | POA: Diagnosis present

## 2022-01-08 DIAGNOSIS — D649 Anemia, unspecified: Secondary | ICD-10-CM | POA: Diagnosis present

## 2022-01-08 DIAGNOSIS — R6521 Severe sepsis with septic shock: Secondary | ICD-10-CM | POA: Diagnosis present

## 2022-01-08 DIAGNOSIS — D6489 Other specified anemias: Secondary | ICD-10-CM | POA: Diagnosis present

## 2022-01-08 DIAGNOSIS — L8996 Pressure-induced deep tissue damage of unspecified site: Secondary | ICD-10-CM | POA: Diagnosis present

## 2022-01-08 DIAGNOSIS — Z886 Allergy status to analgesic agent status: Secondary | ICD-10-CM

## 2022-01-08 DIAGNOSIS — R579 Shock, unspecified: Secondary | ICD-10-CM | POA: Diagnosis not present

## 2022-01-08 DIAGNOSIS — E785 Hyperlipidemia, unspecified: Secondary | ICD-10-CM | POA: Diagnosis present

## 2022-01-08 DIAGNOSIS — D62 Acute posthemorrhagic anemia: Secondary | ICD-10-CM | POA: Diagnosis present

## 2022-01-08 DIAGNOSIS — I2510 Atherosclerotic heart disease of native coronary artery without angina pectoris: Secondary | ICD-10-CM | POA: Diagnosis present

## 2022-01-08 DIAGNOSIS — Z79899 Other long term (current) drug therapy: Secondary | ICD-10-CM

## 2022-01-08 DIAGNOSIS — I639 Cerebral infarction, unspecified: Secondary | ICD-10-CM | POA: Diagnosis present

## 2022-01-08 DIAGNOSIS — Z794 Long term (current) use of insulin: Secondary | ICD-10-CM

## 2022-01-08 HISTORY — DX: Hypoglycemia, unspecified: E16.2

## 2022-01-08 HISTORY — DX: Type 2 diabetes mellitus with hypoglycemia without coma: E11.649

## 2022-01-08 LAB — CBC
HCT: 21.4 % — ABNORMAL LOW (ref 36.0–46.0)
HCT: 22.8 % — ABNORMAL LOW (ref 36.0–46.0)
HCT: 19.4 % — ABNORMAL LOW (ref 36.0–46.0)
Hemoglobin: 7 g/dL — ABNORMAL LOW (ref 12.0–15.0)
Hemoglobin: 7.2 g/dL — ABNORMAL LOW (ref 12.0–15.0)
Hemoglobin: 5.9 g/dL — CL (ref 12.0–15.0)
MCH: 31.4 pg (ref 26.0–34.0)
MCH: 31.3 pg (ref 26.0–34.0)
MCH: 31.4 pg (ref 26.0–34.0)
MCHC: 32.7 g/dL (ref 30.0–36.0)
MCHC: 31.6 g/dL (ref 30.0–36.0)
MCHC: 30.4 g/dL (ref 30.0–36.0)
MCV: 96 fL (ref 80.0–100.0)
MCV: 99.1 fL (ref 80.0–100.0)
MCV: 103.2 fL — ABNORMAL HIGH (ref 80.0–100.0)
Platelets: 450 10*3/uL — ABNORMAL HIGH (ref 150–400)
Platelets: UNDETERMINED 10*3/uL (ref 150–400)
Platelets: 454 10*3/uL — ABNORMAL HIGH (ref 150–400)
RBC: 2.23 MIL/uL — ABNORMAL LOW (ref 3.87–5.11)
RBC: 2.3 MIL/uL — ABNORMAL LOW (ref 3.87–5.11)
RBC: 1.88 MIL/uL — ABNORMAL LOW (ref 3.87–5.11)
RDW: 22.6 % — ABNORMAL HIGH (ref 11.5–15.5)
RDW: 22.1 % — ABNORMAL HIGH (ref 11.5–15.5)
RDW: 23.2 % — ABNORMAL HIGH (ref 11.5–15.5)
WBC: 15.3 10*3/uL — ABNORMAL HIGH (ref 4.0–10.5)
WBC: 18 10*3/uL — ABNORMAL HIGH (ref 4.0–10.5)
WBC: 21.8 10*3/uL — ABNORMAL HIGH (ref 4.0–10.5)
nRBC: 1.6 % — ABNORMAL HIGH (ref 0.0–0.2)
nRBC: 2.2 % — ABNORMAL HIGH (ref 0.0–0.2)
nRBC: 7.6 % — ABNORMAL HIGH (ref 0.0–0.2)

## 2022-01-08 LAB — BLOOD GAS, ARTERIAL
Acid-base deficit: 16.1 mmol/L — ABNORMAL HIGH (ref 0.0–2.0)
Bicarbonate: 13 mmol/L — ABNORMAL LOW (ref 20.0–28.0)
O2 Saturation: 98.9 %
Patient temperature: 37.7
pCO2 arterial: 43 mmHg (ref 32–48)
pH, Arterial: 7.09 — CL (ref 7.35–7.45)
pO2, Arterial: 151 mmHg — ABNORMAL HIGH (ref 83–108)

## 2022-01-08 LAB — COMPREHENSIVE METABOLIC PANEL
ALT: 72 U/L — ABNORMAL HIGH (ref 0–44)
AST: 160 U/L — ABNORMAL HIGH (ref 15–41)
Albumin: <1.5 g/dL — ABNORMAL LOW (ref 3.5–5.0)
Alkaline Phosphatase: 353 U/L — ABNORMAL HIGH (ref 38–126)
Anion gap: 28 — ABNORMAL HIGH (ref 5–15)
BUN: 64 mg/dL — ABNORMAL HIGH (ref 8–23)
CO2: 11 mmol/L — ABNORMAL LOW (ref 22–32)
Calcium: 8.2 mg/dL — ABNORMAL LOW (ref 8.9–10.3)
Chloride: 102 mmol/L (ref 98–111)
Creatinine, Ser: 4.99 mg/dL — ABNORMAL HIGH (ref 0.44–1.00)
GFR, Estimated: 9 mL/min — ABNORMAL LOW (ref >60)
Glucose, Bld: 222 mg/dL — ABNORMAL HIGH (ref 70–99)
Potassium: 4 mmol/L (ref 3.5–5.1)
Sodium: 141 mmol/L (ref 135–145)
Total Bilirubin: 7.5 mg/dL — ABNORMAL HIGH (ref 0.3–1.2)
Total Protein: 6 g/dL — ABNORMAL LOW (ref 6.5–8.1)

## 2022-01-08 LAB — PREPARE RBC (CROSSMATCH)

## 2022-01-08 LAB — CULTURE, BLOOD (ROUTINE X 2)
Culture: NO GROWTH
Culture: NO GROWTH
Special Requests: ADEQUATE
Special Requests: ADEQUATE

## 2022-01-08 MED ORDER — ORAL CARE MOUTH RINSE: 15.0000 mL | OROMUCOSAL | Status: DC

## 2022-01-08 MED ORDER — LACOSAMIDE 10 MG/ML PO SOLN: 150.0000 mg | Freq: Two times a day (BID) | ORAL | Status: DC

## 2022-01-08 MED ORDER — MIDODRINE HCL 5 MG PO TABS: 10.0000 mg | ORAL_TABLET | Freq: Three times a day (TID) | ORAL | Status: DC

## 2022-01-08 MED ORDER — MIDAZOLAM HCL 2 MG/2ML IJ SOLN: 1.0000 mg | INTRAMUSCULAR | Status: DC | PRN

## 2022-01-08 MED ORDER — FAMOTIDINE 20 MG PO TABS: 20.0000 mg | ORAL_TABLET | Freq: Two times a day (BID) | ORAL | Status: DC

## 2022-01-08 MED ORDER — VANCOMYCIN HCL IN DEXTROSE 1-5 GM/200ML-% IV SOLN: 1000.0000 mg | Freq: Once | INTRAVENOUS | Status: DC

## 2022-01-08 MED ORDER — VANCOMYCIN HCL 500 MG/100ML IV SOLN: 500.0000 mg | INTRAVENOUS | Status: DC

## 2022-01-08 MED ORDER — CHLORHEXIDINE GLUCONATE CLOTH 2 % EX PADS: 6.0000 | MEDICATED_PAD | Freq: Every day | CUTANEOUS | Status: DC

## 2022-01-08 MED ORDER — FOLIC ACID 1 MG PO TABS: 1.0000 mg | ORAL_TABLET | Freq: Every day | ORAL | Status: DC

## 2022-01-08 MED ORDER — PANTOPRAZOLE SODIUM 40 MG IV SOLR: 40.0000 mg | Freq: Two times a day (BID) | INTRAVENOUS | Status: DC

## 2022-01-08 MED ORDER — NOREPINEPHRINE 4 MG/250ML-% IV SOLN: 0.0000 ug/min | INTRAVENOUS | Status: DC

## 2022-01-08 MED ORDER — SODIUM CHLORIDE 0.9 % IV SOLN: INTRAVENOUS | Status: DC

## 2022-01-08 MED ORDER — PHENYTOIN 125 MG/5ML PO SUSP: 100.0000 mg | Freq: Three times a day (TID) | ORAL | Status: DC

## 2022-01-08 MED ORDER — ORAL CARE MOUTH RINSE: 15.0000 mL | OROMUCOSAL | Status: DC | PRN

## 2022-01-08 MED ORDER — FENTANYL CITRATE PF 50 MCG/ML IJ SOSY: 25.0000 ug | PREFILLED_SYRINGE | INTRAMUSCULAR | Status: DC | PRN

## 2022-01-08 MED ORDER — LEVETIRACETAM 100 MG/ML PO SOLN: 500.0000 mg | Freq: Two times a day (BID) | ORAL | Status: DC

## 2022-01-08 MED ORDER — PIPERACILLIN-TAZOBACTAM IN DEX 2-0.25 GM/50ML IV SOLN
2.2500 g | Freq: Three times a day (TID) | INTRAVENOUS | Status: DC
  Filled 2022-01-08 (×2): qty 50

## 2022-01-08 MED ORDER — INSULIN ASPART 100 UNIT/ML IJ SOLN: 0.0000 [IU] | INTRAMUSCULAR | Status: DC

## 2022-01-09 LAB — TYPE AND SCREEN
ABO/RH(D): B POS
Antibody Screen: NEGATIVE
Unit division: 0
Unit division: 0

## 2022-01-09 LAB — BPAM RBC
Blood Product Expiration Date: 202312102359
Blood Product Expiration Date: 202312122359
ISSUE DATE / TIME: 202311211307
ISSUE DATE / TIME: 202311231552
Unit Type and Rh: 7300
Unit Type and Rh: 7300

## 2022-01-09 LAB — CULTURE, BLOOD (ROUTINE X 2)
Culture: NO GROWTH
Special Requests: ADEQUATE

## 2022-01-10 LAB — PATHOLOGIST SMEAR REVIEW: Path Review: INCREASED

## 2022-01-12 LAB — GLUCOSE, CAPILLARY: Glucose-Capillary: 157 mg/dL — ABNORMAL HIGH (ref 70–99)

## 2022-01-13 MED FILL — Medication: Qty: 2 | Status: AC

## 2022-01-16 NOTE — Death Summary Note (Signed)
Name: Erica [name] MRN: [id number] DOB: [birth year] Admit date: 01-28-22 Date/time of death: 28-Jan-2022, 20:40  Cause of death: HCAP with septic shock  Summary: 76 yof resides at [facility] w/ sig h/o ESRD on HD via left arm AVG, stroke Oct 2023 got TNk, c/b graft site bleeding, non-convulsive status epilepticus, PNA,  required prolonged vent dependence was extubated 11/11 and was eventually transferred to [facility] for on-going rehab service exam on time of discharge: awake, noded to questions, could squeeze eyes to command but not much more. Was at [facility] recovering;  course c/b AVG site bleeding d/t bleeding pseudoaneurysm  and required stenting of AVG on 2023-01-26. On January 28, 2023 coded, nursing reporting dark bloody stools. working dx was GIB (hgb dropped > 2 gms  from day prior). Per chart review difficult to understand series of events that occurred (blood ordered not clear if administered). On Jan 29, 2023 she had two more episodes of cardiac arrest. Hgb 5.9 Critical care asked to take in transfer. Chest xray showed progressive Rt upper lobe infiltrate.  On ABx.  On pressors.  Noted to have elevated bilirubin.  Final diagnoses: Rt upper lobe HCAP Septic shock Hemorrhagic shock PEA bradycardia Acute respiratory failure with hypoxia Anion gap metabolic acidosis with lactic acidosis ESRD on HD Seizure disorder CVA October 2023 Jaundice with elevated bilirubin Severe protein calorie malnutrition Acute blood loss anemia Anemia of critical illness and chronic disease GI bleeding with undetermined source DM type 2 poorly controlled with hyperglycemia Hx of CAD Chronic HFpEF  Chesley Mires, MD [place] 01/28/2022, 10:13 PM

## 2022-01-16 NOTE — H&P (Signed)
NAME:  Erica [name], MRN:  [id number], DOB:  Jun 30, 1955, LOS: 0 ADMISSION DATE:  (Not on file), CONSULTATION DATE:  11/23 REFERRING MD:  [facility], CHIEF COMPLAINT:  cardiac arrest   History of Present Illness:  77 yof resides at SNF w/ sig h/o ESRD on HD via left arm AVG, stroke Oct 2023 got TNk, c/b graft site bleeding, non-convulsive status epilepticus, PNA,  required prolonged vent dependence was extubated 11/11 and was eventually transferred to [facility] for on-going rehab service exam on time of discharge: awake, noded to questions, could squeeze eyes to command but not much more. Was LTAC recovering;  course c/b AVG site bleeding d/t bleeding pseudoaneurysm  and required stenting of AVG on 2023-01-17. On 2023-01-19 coded, nursing reporting dark bloody stools. working dx was GIB (hgb dropped > 2 gms  from day prior). Per chart review difficult to understand series of events that occurred (blood ordered not clear if administered). On 11/23 she had two more episodes of cardiac arrest. Hgb 5.9 Critical care asked to take in transfer.   Pertinent  Medical History  ETOH related pancreatitis, CAD, chronic diarrhea, ESRD on HD, Depression, DM, diastolic HF, prir DKA, GERD, Gastroparesis, HTN, Stroke   Significant Hospital Events: Including procedures, antibiotic start and stop dates in addition to other pertinent events   11/23 admitted from [facility] s/p cardiac arrest (3rd in 24 hrs) initial working dx GIB w/ hgb dropping ~ 2 gms in 24 hrs   Interim History / Subjective:    Objective   There were no vitals taken for this visit.       No intake or output data in the 24 hours ending Jan 19, 2022 1843 There were no vitals filed for this visit.  Examination: General: frail elderly appearing female as above HENT: [place]/AT, PERRL, scleral icterus +, no JVD. RIJ CVL site oozing.  Lungs: Rhonchi on the R, clear on the L. No distress. Breathing well above the set rate on the ventilator.  Cardiovascular: RRR, no  MRG Abdomen: Soft, non-tender, non-distended. Hard nodular superficial mass with surrounding ecchymosis in the RLQ Extremities: RUE edema distal to the fistula. Weak thrill to the fistula.  Neuro: Eyes open spontaneously, otherwise not interactive.   Resolved Hospital Problem list     Assessment & Plan:   Principal Problem:   Cardiac arrest, cause unspecified ([place]) Active Problems:   Shock circulatory ([place])   Hemorrhagic shock ([place])   Acute hypoxic respiratory failure ([place])   GIB (gastrointestinal bleeding)   Seizure disorder ([place])   Stroke ([place])   Uncontrolled type 2 diabetes mellitus with hyperglycemia, with long-term current use of insulin (HCC)   Chronic diastolic congestive heart failure (HCC)   Anemia   CAD (coronary artery disease)   GERD (gastroesophageal reflux disease)   Esophageal dysphagia   Anemia in chronic kidney disease, on chronic dialysis (HCC)   S/p cardiac arrest. Etiology seems to be acute blood loss vs sepsis. Hgb dropped to lowest 5.9 from 7.7. Also with a new pneumonia on CXR today.  - Admit to ICU - Transfused 1 unit PRBC today. Will re-evaluate CBC - Hold all anti platelet and anticoagulation  - PPI gtt - Will need GI eval once more stable   Circulatory shock. Hemorrhagic vs septic.  - Hold all antihypertensives - Wean NE for MAP > 65, dc dopamine  - Echo, cycle enzymes, ck co-ox and lactate  - Tele   Anemia, acute blood loss 2/2 GIB superimposed on anemia of CKD - PPI  BID - Transfuse as indicated - Hold any AC  - LDH, haptoglobin, smear pending review.   Acute hypoxic respiratory Failure s/p cardiac arrest c/b aspiration PNA - Full vent support - VAP bundle - PAD protocol  - F/u abg - empiric vanc and zosyn   Anion gap metabolic acidosis: suspect lactic acidosis in the setting of shock - Support BP as above - Lactic pending - Trend BMP, ABG  acute metabolic encephalopathy s/p cardiac arrest superimposed on recent CVA w/ deficits  (pre-arrest exam per report oriented 1-2, dysphagia, was able to follow commands)  - Supportive care - Treat above   H/o seizures - Cont Keppra, vimpat and phenytoin   ESRD MWF - Last hd rx 11/22 Plan Nephro consult in am   Type II DM w/ hyperglycemia Plan Ssi   Severe protein cal malnutrition  Plan NPO for now  Has cor trak   Best Practice (right click and "Reselect all SmartList Selections" daily)   Diet/type: tubefeeds and NPO DVT prophylaxis: not indicated GI prophylaxis: PPI Lines: Central line Foley:  N/A Code Status:  DNR Last date of multidisciplinary goals of care discussion [Niece updated bedside 11/23]  Labs   CBC: Recent Labs  Lab 01/07/22 1540 01/07/22 2234 01-19-22 0421 01-19-22 0958 19-Jan-2022 1555  WBC 15.4* 14.9* 15.3* 18.0* 21.8*  HGB 7.1* 7.0* 7.0* 7.2* 5.9*  HCT 23.9* 21.2* 21.4* 22.8* 19.4*  MCV 101.3* 95.5 96.0 99.1 103.2*  PLT 428* 430* 450* PLATELET CLUMPS NOTED ON SMEAR, UNABLE TO ESTIMATE 454*    Basic Metabolic Panel: Recent Labs  Lab 01/03/22 0220 01/05/22 0144 01/07/22 0109 01/07/22 1114 01/07/22 1540 01-19-2022 1555  NA 137 137 137 135 137 141  K 3.2* 4.1 3.9 4.4 4.1 4.0  CL 96* 95* 98 99 97* 102  CO2 27 21* 19* 17* 17* 11*  GLUCOSE 123* 91 204* 267* 207* 222*  BUN 26* 56* 50* 54* 56* 64*  CREATININE 1.76* 3.55* 3.75* 4.23* 4.23* 4.99*  CALCIUM 9.1 9.5 9.4 8.9 9.3 8.2*  MG 2.0 2.3  --   --  2.4  --   PHOS 3.1 4.9*  --   --   --   --    GFR: Estimated Creatinine Clearance: 7.2 mL/min (A) (by C-G formula based on SCr of 4.99 mg/dL (H)). Recent Labs  Lab 01/07/22 2234 01/19/22 0421 01/19/22 0958 January 19, 2022 1555  WBC 14.9* 15.3* 18.0* 21.8*    Liver Function Tests: Recent Labs  Lab 01/07/22 1114 01/19/22 1555  AST 97* 160*  ALT 57* 72*  ALKPHOS 442* 353*  BILITOT 9.7* 7.5*  PROT 7.1 6.0*  ALBUMIN 1.8* <1.5*   No results for input(s): "LIPASE", "AMYLASE" in the last 168 hours. No results for input(s):  "AMMONIA" in the last 168 hours.  ABG    Component Value Date/Time   PHART 7.09 (LL) January 19, 2022 1540   PCO2ART 43 January 19, 2022 1540   PO2ART 151 (H) January 19, 2022 1540   HCO3 13.0 (L) 2022-01-19 1540   TCO2 23 12/29/2021 1749   ACIDBASEDEF 16.1 (H) 2022-01-19 1540   O2SAT 98.9 2022-01-19 1540     Coagulation Profile: Recent Labs  Lab 01/02/22 1631 01/03/22 0220 01/06/22 0631 01/07/22 0109  INR 4.1* 3.9* 2.1* 1.5*    Cardiac Enzymes: No results for input(s): "CKTOTAL", "CKMB", "CKMBINDEX", "TROPONINI" in the last 168 hours.  HbA1C: HbA1c, POC (controlled diabetic range)  Date/Time Value Ref Range Status  11/25/2020 02:40 PM 8.1 (A) 0.0 - 7.0 % Final   Hgb  A1c MFr Bld  Date/Time Value Ref Range Status  10/16/2021 05:08 AM 5.5 4.8 - 5.6 % Final    Comment:    (NOTE) Pre diabetes:          5.7%-6.4%  Diabetes:              >6.4%  Glycemic control for   <7.0% adults with diabetes   06/19/2021 08:55 AM 6.4 (H) 4.8 - 5.6 % Final    Comment:    (NOTE) Pre diabetes:          5.7%-6.4%  Diabetes:              >6.4%  Glycemic control for   <7.0% adults with diabetes     CBG: No results for input(s): "GLUCAP" in the last 168 hours.  Review of Systems:   Patient is encephalopathic and/or intubated. Therefore history has been obtained from chart review.   Past Medical History:  She,  has a past medical history of Alcohol-induced pancreatitis, CAD (coronary artery disease) (09/17/2020), Chronic diarrhea, Chronic kidney disease, Depression, Diabetes mellitus, Dialysis patient ([place]), Diastolic CHF ([place]), DKA (diabetic ketoacidoses), Gastroparesis, GERD (gastroesophageal reflux disease), Heart murmur, History of kidney stones, Hyperlipidemia, Hypertension, Hypoglycemia (2022-01-14), Hypoglycemia associated with type 2 diabetes mellitus ([place]) (January 14, 2022), Hypokalemia, Muscle spasm, Neuropathic pain, Neuropathy, Pyelonephritis, Seizure disorder ([place]) (04/11/2018), Seizures  ([place]), Vitamin B12 deficiency, and Vitamin D deficiency.   Surgical History:   Past Surgical History:  Procedure Laterality Date   A/V FISTULAGRAM N/A 01/05/2022   Procedure: A/V Fistulagram;  Surgeon: Waynetta Sandy, MD;  Location: [facility];  Service: Cardiovascular;  Laterality: N/A;   AV FISTULA PLACEMENT Left 12/15/2017   Procedure: INSERTION OF ARTERIOVENOUS (AV) GORE-TEX GRAFT ARM;  Surgeon: Rosetta Posner, MD;  Location: [place] [facility];  Service: Vascular;  Laterality: Left;   CATARACT EXTRACTION W/PHACO Right 03/14/2015   Procedure: CATARACT EXTRACTION PHACO AND INTRAOCULAR LENS PLACEMENT ([place]);  Surgeon: Baruch Goldmann, MD;  Location: [facility];  Service: Ophthalmology;  Laterality: Right;  CDE:11.13   CATARACT EXTRACTION W/PHACO Left 04/11/2015   Procedure: CATARACT EXTRACTION PHACO AND INTRAOCULAR LENS PLACEMENT LEFT EYE CDE=9.68;  Surgeon: Baruch Goldmann, MD;  Location: [facility];  Service: Ophthalmology;  Laterality: Left;   COLONOSCOPY  02/24/2010   fair prep, limited exam. Two small polyps removed. Tubular adenoma   cystoscopy with ureteral stent  Bilateral 10/07/2017   At [place] CV LINE RIGHT  11/17/2017   IR FLUORO GUIDE CV LINE RIGHT  11/22/2017   IR REMOVAL TUN CV CATH W/O FL  01/26/2018   IR US GUIDE VASC ACCESS RIGHT  11/17/2017   MULTIPLE EXTRACTIONS WITH ALVEOLOPLASTY N/A 06/13/2012   Procedure: MULTIPLE EXTRACION WITH ALVEOLOPLASTY EXTRACT: 18, 19, 20, 21, 22, 24, 25, 27, 28, 29, 30, 31;  Surgeon: Gae Bon, DDS;  Location: [place];  Service: Oral Surgery;  Laterality: N/A;   ORIF HUMERUS FRACTURE Right 04/20/2019   Procedure: OPEN REDUCTION INTERNAL FIXATION (ORIF) PROXIMAL HUMERUS FRACTURE;  Surgeon: Marchia Bond, MD;  Location: [facility];  Service: Orthopedics;  Laterality: Right;   PERIPHERAL VASCULAR INTERVENTION  01/05/2022   Procedure: PERIPHERAL VASCULAR INTERVENTION;  Surgeon: Waynetta Sandy, MD;  Location: [facility];  Service: Cardiovascular;;   REVISION OF ARTERIOVENOUS GORETEX GRAFT Left 11/04/2021   Procedure: REVISION OF LEFT UPPER ARM ARTERIOVENOUS GORETEX GRAFT WITH REPLACEMENT OF LATERAL ARTERIAL HALF OF GRAFT;  Surgeon: Rosetta Posner, MD;  Location: [facility];  Service: Vascular;  Laterality: Left;   TUBAL LIGATION     URETERAL STENT PLACEMENT  09/2017     Social History:   reports that she has never smoked. Her smokeless tobacco use includes snuff. She reports that she does not currently use alcohol after a past usage of about 1.0 standard drink of alcohol per week. She reports that she does not use drugs.   Family History:  Her family history includes Diabetes in her sister. There is no history of Chronic Renal Failure, Colon cancer, or Colon polyps.   Allergies Allergies  Allergen Reactions   Aspirin Palpitations and Other (See Comments)    Listed on [facility] 11/12/18   Metformin And Related      Home Medications  Prior to Admission medications   Medication Sig Start Date End Date Taking? Authorizing Provider  acetaminophen (TYLENOL) 325 MG tablet Take 2 tablets (650 mg total) by mouth every 6 (six) hours as needed for mild pain (or Fever >/= 101). 10/19/21   Allie Bossier, MD  Biotin 10 MG TABS Take 10 mg by mouth daily at 12 noon.    [provider]  Darbepoetin Alfa (ARANESP) 60 MCG/0.3ML SOSY injection Inject 0.3 mLs (60 mcg total) into the skin every Saturday at 6 PM. 01/03/22   Little Ishikawa, MD  famotidine (PEPCID) 10 MG tablet Place 1 tablet (10 mg total) into feeding tube daily. 01/01/22   Little Ishikawa, MD  folic acid (FOLVITE) 1 MG tablet Place 1 tablet (1 mg total) into feeding tube daily at 12 noon. 12/31/21   Little Ishikawa, MD  insulin aspart (NOVOLOG) 100 UNIT/ML injection Inject 0-6 Units into the skin every 4 (four) hours. 12/31/21   Little Ishikawa, MD  lacosamide (VIMPAT) 10 MG/ML oral solution Take 15 mLs (150 mg total) by  mouth 2 (two) times daily. 12/31/21   Little Ishikawa, MD  lacosamide (VIMPAT) 10 MG/ML oral solution Take 5 mLs (50 mg total) by mouth 3 (three) times a week. After HD session 12/31/21   Little Ishikawa, MD  levETIRAcetam (KEPPRA) 100 MG/ML solution Place 5 mLs (500 mg total) into feeding tube 2 (two) times daily. 12/31/21   Little Ishikawa, MD  levETIRAcetam (KEPPRA) 100 MG/ML solution Place 2.5 mLs (250 mg total) into feeding tube as needed (give after each HD). 12/31/21   Little Ishikawa, MD  mirtazapine (REMERON) 30 MG tablet Take 30 mg by mouth at bedtime.    [provider]  Multiple Vitamin (MULTIVITAMIN ADULT PO) Take 1 tablet by mouth daily at 12 noon.    [provider]  multivitamin (RENA-VIT) TABS tablet Place 1 tablet into feeding tube at bedtime. 12/31/21   Little Ishikawa, MD  nutrition supplement, JUVEN, Fanny Dance) PACK Place 1 packet into feeding tube 2 (two) times daily between meals. 12/31/21   Little Ishikawa, MD  Nutritional Supplements (FEEDING SUPPLEMENT, PEPTAMEN 1.5 CAL,) LIQD Place 1,000 mLs into feeding tube continuous. 12/31/21   Little Ishikawa, MD  pantoprazole (PROTONIX) 40 MG tablet Take 1 tablet (40 mg total) by mouth daily. Patient taking differently: Take 40 mg by mouth daily at 12 noon. 11/19/19 12/15/21  Oswald Hillock, MD  phenytoin (DILANTIN) 125 MG/5ML suspension Place 4 mLs (100 mg total) into feeding tube every 8 (eight) hours. 12/31/21   Little Ishikawa, MD  Protein (FEEDING SUPPLEMENT, PROSOURCE TF20,) liquid Place 60 mLs into feeding tube daily. 01/01/22  Little Ishikawa, MD  vitamin C (ASCORBIC ACID) 500 MG tablet Take 500 mg by mouth 2 (two) times daily.    [provider]     Critical care time: 53 minutes     Georgann Housekeeper, AGACNP-BC [place]  See Amion for personal pager PCCM on call pager [phone] until 7pm. Please call Elink 7p-7a.  [phone]  2022-01-26 8:38 PM

## 2022-01-16 NOTE — Progress Notes (Signed)
eLink Physician-Brief Progress Note Patient Name: Erica [name] DOB: 20-May-1955 MRN: [id number]   Date of Service  02-05-2022  HPI/Events of Note  50 F ESRD on HD via left arm graft, recent CVA s/p TNK (10/23), ETOH, seizure, prolonged intubation transferred to [facility]. Had malfunctioning/bleeding graft 11/20 and underwent stenting.  Had cardiac arrest yesterday and reportedly 2 more arrests earlier today with possible aspiration CXR with RUL opacity. Hgb 5.9 form 7.2 transfused 1 unit PRBC. On bedside RN assessment has occasional eye opening. On norepinephrine titrating down.  eICU Interventions  Septic vs hemorrhagic shock Monitoring H/H, transfuse as needed to keep Hgb >7 On vancomycin and piperacillin-tazobactam Chronic respiratory failure Discussed with bedside RN     Intervention Category Evaluation Type: New Patient Evaluation  Judd Lien 2022-02-05, 8:03 PM

## 2022-01-16 NOTE — Progress Notes (Signed)
Patient arrived approximately 1855-[facility], vitals as documented. CBG 157. Skin assessment as documented, completed by myself and Lyla Son, RN. CCM MD arriving at bedside now, 1940.   January 26, 2022 Romona Curls, RN

## 2022-01-16 NOTE — Progress Notes (Addendum)
At approximately May 17, 2038, RN was brought into room because patient was suddenly bradycardic. Pulse assessed - no pulse felt at this time. E-Link notified - patient has DNR order placed.   Dr. Genevive Bi on monitor at 05/17/2046 - made aware of situation. No new orders at this time. Family has also been called - son [name] stated to call when final time of death is called.   05-17-2054 TOD.   At approximately May 16, 2128, all lines removed from patient. Patient placed in bag and on stretcher. [facility] has been called and is not a candidate. Calvin, son, has been notified of time of death.

## 2022-01-16 NOTE — Progress Notes (Addendum)
Pharmacy Antibiotic Note  Erica [name] is a 66 y.o. female admitted on 02/04/22 with pneumonia.  Pharmacy has been consulted for zosyn and vanco dosing.  Plan: Zosyn 2.25 gram iv q8h (30 min infusion Vanco 1000 mg iv x1 now f/b 500 mg iv qTuesday, Thursday, and Wednesday after HD session  Weight: 49.3 kg (108 lb 11 oz)  Temp (24hrs), Avg:98.1 F (36.7 C), Min:98.1 F (36.7 C), Max:98.1 F (36.7 C)  Recent Labs  Lab 01/05/22 0144 01/06/22 0631 01/07/22 0109 01/07/22 1114 01/07/22 1540 01/07/22 2234 04-Feb-2022 0421 2022-02-04 0958 02/04/22 1555  WBC 13.7*   < > 14.8* 14.1* 15.4* 14.9* 15.3* 18.0* 21.8*  CREATININE 3.55*  --  3.75* 4.23* 4.23*  --   --   --  4.99*   < > = values in this interval not displayed.    Estimated Creatinine Clearance: 7.2 mL/min (A) (by C-G formula based on SCr of 4.99 mg/dL (H)).    Allergies  Allergen Reactions   Aspirin Palpitations and Other (See Comments)    Listed on [facility] 11/12/18   Metformin And Related     Antimicrobials this admission: Zosyn 11/23 >>  Vanc 11/23 >>  Dose adjustments this admission: Adjusted zosyn and vanco for HD   Thank you for allowing pharmacy to be a part of this patient's care.  Vaughan Basta BS, PharmD, BCPS Clinical Pharmacist 02-04-2022 8:13 PM  Contact: [phone] after 3 PM  "Be curious, not judgmental..." -Jamal Maes

## 2022-01-16 DEATH — deceased

## 2022-01-21 ENCOUNTER — Other Ambulatory Visit (HOSPITAL_COMMUNITY): Payer: Medicare Other

## 2022-01-26 ENCOUNTER — Ambulatory Visit (HOSPITAL_COMMUNITY): Admit: 2022-01-26 | Payer: Medicare Other

## 2022-01-26 SURGERY — COLONOSCOPY WITH PROPOFOL
Anesthesia: Monitor Anesthesia Care
